# Patient Record
Sex: Female | Born: 1935
Health system: Southern US, Community
[De-identification: ages and names within clinical notes are randomized; demographics above are authoritative.]

## PROBLEM LIST (undated history)

## (undated) DIAGNOSIS — H269 Unspecified cataract: Secondary | ICD-10-CM

## (undated) DIAGNOSIS — J4489 Other specified chronic obstructive pulmonary disease: Secondary | ICD-10-CM

## (undated) DIAGNOSIS — E785 Hyperlipidemia, unspecified: Secondary | ICD-10-CM

## (undated) DIAGNOSIS — J189 Pneumonia, unspecified organism: Secondary | ICD-10-CM

## (undated) DIAGNOSIS — I10 Essential (primary) hypertension: Secondary | ICD-10-CM

## (undated) DIAGNOSIS — D689 Coagulation defect, unspecified: Secondary | ICD-10-CM

## (undated) DIAGNOSIS — F32A Depression, unspecified: Secondary | ICD-10-CM

## (undated) DIAGNOSIS — T7840XA Allergy, unspecified, initial encounter: Secondary | ICD-10-CM

## (undated) DIAGNOSIS — I272 Pulmonary hypertension, unspecified: Secondary | ICD-10-CM

## (undated) DIAGNOSIS — Z86718 Personal history of other venous thrombosis and embolism: Secondary | ICD-10-CM

## (undated) DIAGNOSIS — N824 Other female intestinal-genital tract fistulae: Secondary | ICD-10-CM

## (undated) DIAGNOSIS — K5730 Diverticulosis of large intestine without perforation or abscess without bleeding: Secondary | ICD-10-CM

## (undated) DIAGNOSIS — Z8719 Personal history of other diseases of the digestive system: Secondary | ICD-10-CM

## (undated) DIAGNOSIS — Z973 Presence of spectacles and contact lenses: Secondary | ICD-10-CM

## (undated) DIAGNOSIS — Z9289 Personal history of other medical treatment: Secondary | ICD-10-CM

## (undated) DIAGNOSIS — F329 Major depressive disorder, single episode, unspecified: Secondary | ICD-10-CM

## (undated) DIAGNOSIS — J449 Chronic obstructive pulmonary disease, unspecified: Secondary | ICD-10-CM

## (undated) DIAGNOSIS — Z860101 Personal history of adenomatous and serrated colon polyps: Secondary | ICD-10-CM

## (undated) DIAGNOSIS — K219 Gastro-esophageal reflux disease without esophagitis: Secondary | ICD-10-CM

## (undated) DIAGNOSIS — Z8673 Personal history of transient ischemic attack (TIA), and cerebral infarction without residual deficits: Secondary | ICD-10-CM

## (undated) DIAGNOSIS — I2510 Atherosclerotic heart disease of native coronary artery without angina pectoris: Secondary | ICD-10-CM

## (undated) DIAGNOSIS — M1990 Unspecified osteoarthritis, unspecified site: Secondary | ICD-10-CM

## (undated) DIAGNOSIS — I5032 Chronic diastolic (congestive) heart failure: Secondary | ICD-10-CM

## (undated) DIAGNOSIS — I499 Cardiac arrhythmia, unspecified: Secondary | ICD-10-CM

## (undated) DIAGNOSIS — M810 Age-related osteoporosis without current pathological fracture: Secondary | ICD-10-CM

## (undated) DIAGNOSIS — Z86010 Personal history of colonic polyps: Secondary | ICD-10-CM

## (undated) DIAGNOSIS — J45909 Unspecified asthma, uncomplicated: Secondary | ICD-10-CM

## (undated) DIAGNOSIS — Z972 Presence of dental prosthetic device (complete) (partial): Secondary | ICD-10-CM

## (undated) DIAGNOSIS — I639 Cerebral infarction, unspecified: Secondary | ICD-10-CM

## (undated) DIAGNOSIS — Z933 Colostomy status: Secondary | ICD-10-CM

## (undated) DIAGNOSIS — N289 Disorder of kidney and ureter, unspecified: Secondary | ICD-10-CM

## (undated) DIAGNOSIS — F419 Anxiety disorder, unspecified: Secondary | ICD-10-CM

## (undated) DIAGNOSIS — C189 Malignant neoplasm of colon, unspecified: Secondary | ICD-10-CM

## (undated) DIAGNOSIS — G629 Polyneuropathy, unspecified: Secondary | ICD-10-CM

## (undated) DIAGNOSIS — D649 Anemia, unspecified: Secondary | ICD-10-CM

## (undated) HISTORY — DX: Pulmonary hypertension, unspecified: I27.20

## (undated) HISTORY — DX: Cerebral infarction, unspecified: I63.9

## (undated) HISTORY — PX: ROTATOR CUFF REPAIR: SHX139

## (undated) HISTORY — DX: Chronic diastolic (congestive) heart failure: I50.32

## (undated) HISTORY — PX: ABDOMINAL HYSTERECTOMY: SHX81

## (undated) HISTORY — PX: APPENDECTOMY: SHX54

## (undated) HISTORY — PX: CORONARY ANGIOPLASTY WITH STENT PLACEMENT: SHX49

## (undated) HISTORY — DX: Atherosclerotic heart disease of native coronary artery without angina pectoris: I25.10

## (undated) HISTORY — PX: HAND SURGERY: SHX662

## (undated) HISTORY — PX: REVISION TOTAL KNEE ARTHROPLASTY: SUR1280

## (undated) HISTORY — DX: Age-related osteoporosis without current pathological fracture: M81.0

## (undated) HISTORY — PX: TOTAL KNEE ARTHROPLASTY: SHX125

## (undated) HISTORY — DX: Unspecified asthma, uncomplicated: J45.909

## (undated) HISTORY — PX: CATARACT EXTRACTION W/ INTRAOCULAR LENS  IMPLANT, BILATERAL: SHX1307

## (undated) HISTORY — DX: Unspecified cataract: H26.9

## (undated) HISTORY — PX: CHOLECYSTECTOMY: SHX55

## (undated) HISTORY — PX: OTHER SURGICAL HISTORY: SHX169

## (undated) HISTORY — DX: Allergy, unspecified, initial encounter: T78.40XA

## (undated) HISTORY — PX: TONSILLECTOMY: SUR1361

## (undated) HISTORY — DX: Essential (primary) hypertension: I10

## (undated) HISTORY — DX: Coagulation defect, unspecified: D68.9

## (undated) HISTORY — PX: LUMBAR LAMINECTOMY: SHX95

## (undated) HISTORY — DX: Anxiety disorder, unspecified: F41.9

## (undated) HISTORY — PX: DILATION AND CURETTAGE OF UTERUS: SHX78

## (undated) HISTORY — PX: CARPAL TUNNEL RELEASE: SHX101

---

## 1998-10-17 ENCOUNTER — Encounter: Payer: Self-pay | Admitting: Orthopedic Surgery

## 1998-10-25 ENCOUNTER — Inpatient Hospital Stay (HOSPITAL_COMMUNITY): Admission: RE | Admit: 1998-10-25 | Discharge: 1998-10-30 | Payer: Self-pay | Admitting: Orthopedic Surgery

## 1998-10-30 ENCOUNTER — Inpatient Hospital Stay (HOSPITAL_COMMUNITY)
Admission: RE | Admit: 1998-10-30 | Discharge: 1998-11-06 | Payer: Self-pay | Admitting: Physical Medicine & Rehabilitation

## 1998-11-08 ENCOUNTER — Encounter
Admission: RE | Admit: 1998-11-08 | Discharge: 1999-01-11 | Payer: Self-pay | Admitting: Physical Medicine & Rehabilitation

## 1999-08-22 ENCOUNTER — Encounter: Payer: Self-pay | Admitting: Internal Medicine

## 1999-08-22 ENCOUNTER — Encounter: Admission: RE | Admit: 1999-08-22 | Discharge: 1999-08-22 | Payer: Self-pay | Admitting: Internal Medicine

## 1999-10-05 ENCOUNTER — Inpatient Hospital Stay (HOSPITAL_COMMUNITY): Admission: RE | Admit: 1999-10-05 | Discharge: 1999-10-10 | Payer: Self-pay | Admitting: Orthopedic Surgery

## 1999-10-10 ENCOUNTER — Inpatient Hospital Stay (HOSPITAL_COMMUNITY)
Admission: RE | Admit: 1999-10-10 | Discharge: 1999-10-18 | Payer: Self-pay | Admitting: Physical Medicine & Rehabilitation

## 1999-10-22 ENCOUNTER — Encounter
Admission: RE | Admit: 1999-10-22 | Discharge: 1999-12-20 | Payer: Self-pay | Admitting: Physical Medicine & Rehabilitation

## 2000-09-26 ENCOUNTER — Encounter: Payer: Self-pay | Admitting: Internal Medicine

## 2000-09-26 ENCOUNTER — Encounter: Admission: RE | Admit: 2000-09-26 | Discharge: 2000-09-26 | Payer: Self-pay | Admitting: Internal Medicine

## 2000-11-12 ENCOUNTER — Encounter: Admission: RE | Admit: 2000-11-12 | Discharge: 2000-11-12 | Payer: Self-pay | Admitting: Orthopedic Surgery

## 2000-11-12 ENCOUNTER — Encounter: Payer: Self-pay | Admitting: Orthopedic Surgery

## 2000-11-26 ENCOUNTER — Encounter: Payer: Self-pay | Admitting: Orthopedic Surgery

## 2000-11-26 ENCOUNTER — Encounter: Admission: RE | Admit: 2000-11-26 | Discharge: 2000-11-26 | Payer: Self-pay | Admitting: Orthopedic Surgery

## 2000-12-10 ENCOUNTER — Encounter: Admission: RE | Admit: 2000-12-10 | Discharge: 2000-12-10 | Payer: Self-pay | Admitting: Orthopedic Surgery

## 2000-12-10 ENCOUNTER — Encounter: Payer: Self-pay | Admitting: Orthopedic Surgery

## 2002-02-10 ENCOUNTER — Encounter: Payer: Self-pay | Admitting: Internal Medicine

## 2002-02-10 ENCOUNTER — Encounter: Admission: RE | Admit: 2002-02-10 | Discharge: 2002-02-10 | Payer: Self-pay | Admitting: Internal Medicine

## 2002-04-19 ENCOUNTER — Encounter: Payer: Self-pay | Admitting: Orthopedic Surgery

## 2002-04-22 ENCOUNTER — Observation Stay (HOSPITAL_COMMUNITY): Admission: RE | Admit: 2002-04-22 | Discharge: 2002-04-25 | Payer: Self-pay | Admitting: Orthopedic Surgery

## 2002-10-04 ENCOUNTER — Encounter: Payer: Self-pay | Admitting: Specialist

## 2002-10-04 ENCOUNTER — Encounter: Admission: RE | Admit: 2002-10-04 | Discharge: 2002-10-04 | Payer: Self-pay | Admitting: Specialist

## 2002-10-29 ENCOUNTER — Encounter: Payer: Self-pay | Admitting: Specialist

## 2002-10-30 ENCOUNTER — Inpatient Hospital Stay (HOSPITAL_COMMUNITY): Admission: RE | Admit: 2002-10-30 | Discharge: 2002-11-03 | Payer: Self-pay | Admitting: Specialist

## 2003-04-08 ENCOUNTER — Encounter: Payer: Self-pay | Admitting: Specialist

## 2003-04-08 ENCOUNTER — Encounter: Admission: RE | Admit: 2003-04-08 | Discharge: 2003-04-08 | Payer: Self-pay | Admitting: Specialist

## 2003-04-18 ENCOUNTER — Ambulatory Visit (HOSPITAL_COMMUNITY): Admission: RE | Admit: 2003-04-18 | Discharge: 2003-04-18 | Payer: Self-pay | Admitting: Internal Medicine

## 2003-04-18 ENCOUNTER — Encounter: Payer: Self-pay | Admitting: Internal Medicine

## 2003-07-26 ENCOUNTER — Encounter: Admission: RE | Admit: 2003-07-26 | Discharge: 2003-07-26 | Payer: Self-pay | Admitting: Specialist

## 2003-11-02 ENCOUNTER — Encounter: Admission: RE | Admit: 2003-11-02 | Discharge: 2003-11-02 | Payer: Self-pay | Admitting: Specialist

## 2004-05-28 ENCOUNTER — Ambulatory Visit: Payer: Self-pay | Admitting: Internal Medicine

## 2004-09-04 ENCOUNTER — Ambulatory Visit: Payer: Self-pay | Admitting: Internal Medicine

## 2004-11-27 ENCOUNTER — Ambulatory Visit: Payer: Self-pay | Admitting: Internal Medicine

## 2005-02-12 ENCOUNTER — Ambulatory Visit: Payer: Self-pay | Admitting: Family Medicine

## 2005-02-27 ENCOUNTER — Ambulatory Visit: Payer: Self-pay | Admitting: Family Medicine

## 2005-03-05 ENCOUNTER — Ambulatory Visit: Payer: Self-pay | Admitting: Family Medicine

## 2005-05-18 ENCOUNTER — Ambulatory Visit: Payer: Self-pay | Admitting: Internal Medicine

## 2005-07-30 ENCOUNTER — Ambulatory Visit: Payer: Self-pay | Admitting: Internal Medicine

## 2005-08-27 ENCOUNTER — Ambulatory Visit: Payer: Self-pay | Admitting: Internal Medicine

## 2005-11-26 ENCOUNTER — Ambulatory Visit: Payer: Self-pay | Admitting: Internal Medicine

## 2006-02-26 ENCOUNTER — Ambulatory Visit: Payer: Self-pay | Admitting: Internal Medicine

## 2006-04-11 ENCOUNTER — Encounter: Admission: RE | Admit: 2006-04-11 | Discharge: 2006-04-11 | Payer: Self-pay | Admitting: Internal Medicine

## 2006-05-14 ENCOUNTER — Ambulatory Visit: Payer: Self-pay | Admitting: Internal Medicine

## 2006-06-17 ENCOUNTER — Encounter: Admission: RE | Admit: 2006-06-17 | Discharge: 2006-06-17 | Payer: Self-pay | Admitting: Family Medicine

## 2006-06-19 ENCOUNTER — Ambulatory Visit: Payer: Self-pay | Admitting: Internal Medicine

## 2006-06-23 ENCOUNTER — Ambulatory Visit: Payer: Self-pay | Admitting: Internal Medicine

## 2006-06-25 ENCOUNTER — Ambulatory Visit: Payer: Self-pay | Admitting: Internal Medicine

## 2006-10-22 ENCOUNTER — Ambulatory Visit: Payer: Self-pay | Admitting: Internal Medicine

## 2006-12-11 ENCOUNTER — Ambulatory Visit: Payer: Self-pay | Admitting: Internal Medicine

## 2007-01-21 ENCOUNTER — Ambulatory Visit: Payer: Self-pay | Admitting: Internal Medicine

## 2007-01-21 ENCOUNTER — Encounter: Payer: Self-pay | Admitting: Internal Medicine

## 2007-01-21 DIAGNOSIS — K449 Diaphragmatic hernia without obstruction or gangrene: Secondary | ICD-10-CM | POA: Insufficient documentation

## 2007-01-21 DIAGNOSIS — K5730 Diverticulosis of large intestine without perforation or abscess without bleeding: Secondary | ICD-10-CM | POA: Insufficient documentation

## 2007-01-21 DIAGNOSIS — Z860100 Personal history of colon polyps, unspecified: Secondary | ICD-10-CM | POA: Insufficient documentation

## 2007-01-21 DIAGNOSIS — I10 Essential (primary) hypertension: Secondary | ICD-10-CM | POA: Insufficient documentation

## 2007-01-21 DIAGNOSIS — K219 Gastro-esophageal reflux disease without esophagitis: Secondary | ICD-10-CM | POA: Insufficient documentation

## 2007-04-13 ENCOUNTER — Telehealth: Payer: Self-pay | Admitting: Internal Medicine

## 2007-05-19 ENCOUNTER — Ambulatory Visit: Payer: Self-pay | Admitting: Internal Medicine

## 2007-05-19 DIAGNOSIS — M1990 Unspecified osteoarthritis, unspecified site: Secondary | ICD-10-CM | POA: Insufficient documentation

## 2007-06-02 ENCOUNTER — Encounter: Admission: RE | Admit: 2007-06-02 | Discharge: 2007-06-02 | Payer: Self-pay | Admitting: Internal Medicine

## 2007-06-13 ENCOUNTER — Emergency Department (HOSPITAL_COMMUNITY): Admission: EM | Admit: 2007-06-13 | Discharge: 2007-06-13 | Payer: Self-pay | Admitting: Family Medicine

## 2007-06-15 ENCOUNTER — Ambulatory Visit: Payer: Self-pay | Admitting: Internal Medicine

## 2007-06-30 ENCOUNTER — Ambulatory Visit: Payer: Self-pay | Admitting: Internal Medicine

## 2007-06-30 DIAGNOSIS — R1031 Right lower quadrant pain: Secondary | ICD-10-CM | POA: Insufficient documentation

## 2007-06-30 LAB — CONVERTED CEMR LAB
Basophils Absolute: 0 10*3/uL (ref 0.0–0.1)
Basophils Relative: 0.7 % (ref 0.0–1.0)
Eosinophils Absolute: 0 10*3/uL (ref 0.0–0.6)
Eosinophils Relative: 0.5 % (ref 0.0–5.0)
Ferritin: 10.5 ng/mL (ref 10.0–291.0)
HCT: 28.3 % — ABNORMAL LOW (ref 36.0–46.0)
Hemoglobin: 9.4 g/dL — ABNORMAL LOW (ref 12.0–15.0)
Lymphocytes Relative: 23.1 % (ref 12.0–46.0)
MCHC: 33.2 g/dL (ref 30.0–36.0)
MCV: 74 fL — ABNORMAL LOW (ref 78.0–100.0)
Monocytes Absolute: 0.4 10*3/uL (ref 0.2–0.7)
Monocytes Relative: 6.9 % (ref 3.0–11.0)
Neutro Abs: 4.5 10*3/uL (ref 1.4–7.7)
Neutrophils Relative %: 68.8 % (ref 43.0–77.0)
Platelets: 222 10*3/uL (ref 150–400)
RBC: 3.82 M/uL — ABNORMAL LOW (ref 3.87–5.11)
RDW: 15.2 % — ABNORMAL HIGH (ref 11.5–14.6)
WBC: 6.4 10*3/uL (ref 4.5–10.5)

## 2007-07-07 ENCOUNTER — Telehealth: Payer: Self-pay | Admitting: Internal Medicine

## 2007-07-14 ENCOUNTER — Telehealth: Payer: Self-pay | Admitting: Family Medicine

## 2007-07-31 ENCOUNTER — Ambulatory Visit: Payer: Self-pay | Admitting: Internal Medicine

## 2007-07-31 DIAGNOSIS — R519 Headache, unspecified: Secondary | ICD-10-CM | POA: Insufficient documentation

## 2007-07-31 DIAGNOSIS — R51 Headache: Secondary | ICD-10-CM | POA: Insufficient documentation

## 2007-07-31 LAB — CONVERTED CEMR LAB
Basophils Absolute: 0.1 10*3/uL (ref 0.0–0.1)
Basophils Relative: 0.9 % (ref 0.0–1.0)
Eosinophils Absolute: 0 10*3/uL (ref 0.0–0.6)
Eosinophils Relative: 0.6 % (ref 0.0–5.0)
HCT: 28.5 % — ABNORMAL LOW (ref 36.0–46.0)
Hemoglobin: 9.5 g/dL — ABNORMAL LOW (ref 12.0–15.0)
Lymphocytes Relative: 23.7 % (ref 12.0–46.0)
MCHC: 33.3 g/dL (ref 30.0–36.0)
MCV: 74.8 fL — ABNORMAL LOW (ref 78.0–100.0)
Monocytes Absolute: 0.5 10*3/uL (ref 0.2–0.7)
Monocytes Relative: 7.6 % (ref 3.0–11.0)
Neutro Abs: 4.2 10*3/uL (ref 1.4–7.7)
Neutrophils Relative %: 67.2 % (ref 43.0–77.0)
Platelets: 198 10*3/uL (ref 150–400)
RBC: 3.81 M/uL — ABNORMAL LOW (ref 3.87–5.11)
RDW: 15.9 % — ABNORMAL HIGH (ref 11.5–14.6)
WBC: 6.3 10*3/uL (ref 4.5–10.5)

## 2007-09-01 ENCOUNTER — Telehealth: Payer: Self-pay | Admitting: Internal Medicine

## 2007-09-09 ENCOUNTER — Ambulatory Visit: Payer: Self-pay | Admitting: Internal Medicine

## 2007-09-14 ENCOUNTER — Telehealth: Payer: Self-pay | Admitting: Internal Medicine

## 2007-10-05 ENCOUNTER — Ambulatory Visit: Payer: Self-pay | Admitting: Internal Medicine

## 2007-10-05 DIAGNOSIS — J069 Acute upper respiratory infection, unspecified: Secondary | ICD-10-CM | POA: Insufficient documentation

## 2007-10-08 ENCOUNTER — Telehealth: Payer: Self-pay | Admitting: Internal Medicine

## 2007-10-08 LAB — CONVERTED CEMR LAB
Basophils Absolute: 0 10*3/uL (ref 0.0–0.1)
Basophils Relative: 0.8 % (ref 0.0–1.0)
Eosinophils Absolute: 0.1 10*3/uL (ref 0.0–0.6)
Eosinophils Relative: 1.2 % (ref 0.0–5.0)
HCT: 30.4 % — ABNORMAL LOW (ref 36.0–46.0)
Hemoglobin: 9.3 g/dL — ABNORMAL LOW (ref 12.0–15.0)
Lymphocytes Relative: 26.5 % (ref 12.0–46.0)
MCHC: 30.5 g/dL (ref 30.0–36.0)
MCV: 75.8 fL — ABNORMAL LOW (ref 78.0–100.0)
Monocytes Absolute: 0.5 10*3/uL (ref 0.2–0.7)
Monocytes Relative: 10.1 % (ref 3.0–11.0)
Neutro Abs: 3.2 10*3/uL (ref 1.4–7.7)
Neutrophils Relative %: 61.4 % (ref 43.0–77.0)
Platelets: 210 10*3/uL (ref 150–400)
RBC: 4.01 M/uL (ref 3.87–5.11)
RDW: 17.4 % — ABNORMAL HIGH (ref 11.5–14.6)
WBC: 5.2 10*3/uL (ref 4.5–10.5)

## 2007-10-12 ENCOUNTER — Telehealth: Payer: Self-pay | Admitting: Internal Medicine

## 2007-10-12 ENCOUNTER — Encounter: Payer: Self-pay | Admitting: Internal Medicine

## 2007-10-30 ENCOUNTER — Ambulatory Visit: Payer: Self-pay | Admitting: Internal Medicine

## 2007-10-30 LAB — CONVERTED CEMR LAB
BUN: 26 mg/dL — ABNORMAL HIGH (ref 6–23)
Basophils Absolute: 0.1 10*3/uL (ref 0.0–0.1)
Basophils Relative: 0.7 % (ref 0.0–1.0)
CO2: 27 meq/L (ref 19–32)
Calcium: 9.1 mg/dL (ref 8.4–10.5)
Chloride: 104 meq/L (ref 96–112)
Creatinine, Ser: 1.3 mg/dL — ABNORMAL HIGH (ref 0.4–1.2)
Eosinophils Absolute: 0 10*3/uL (ref 0.0–0.7)
Eosinophils Relative: 0.1 % (ref 0.0–5.0)
GFR calc Af Amer: 51 mL/min
GFR calc non Af Amer: 42 mL/min
Glucose, Bld: 112 mg/dL — ABNORMAL HIGH (ref 70–99)
HCT: 29.8 % — ABNORMAL LOW (ref 36.0–46.0)
Hemoglobin: 9.6 g/dL — ABNORMAL LOW (ref 12.0–15.0)
INR: 1.2 — ABNORMAL HIGH (ref 0.8–1.0)
Lymphocytes Relative: 6.7 % — ABNORMAL LOW (ref 12.0–46.0)
MCHC: 32.3 g/dL (ref 30.0–36.0)
MCV: 76.7 fL — ABNORMAL LOW (ref 78.0–100.0)
Monocytes Absolute: 1.2 10*3/uL — ABNORMAL HIGH (ref 0.1–1.0)
Monocytes Relative: 6.3 % (ref 3.0–12.0)
Neutro Abs: 16.4 10*3/uL — ABNORMAL HIGH (ref 1.4–7.7)
Neutrophils Relative %: 86.2 % — ABNORMAL HIGH (ref 43.0–77.0)
Platelets: 219 10*3/uL (ref 150–400)
Potassium: 4.9 meq/L (ref 3.5–5.1)
Prothrombin Time: 14.3 s — ABNORMAL HIGH (ref 10.9–13.3)
RBC: 3.88 M/uL (ref 3.87–5.11)
RDW: 18.1 % — ABNORMAL HIGH (ref 11.5–14.6)
Sodium: 139 meq/L (ref 135–145)
WBC: 19 10*3/uL (ref 4.5–10.5)
aPTT: 31.1 s — ABNORMAL HIGH (ref 21.7–29.8)

## 2007-11-02 ENCOUNTER — Ambulatory Visit: Payer: Self-pay | Admitting: Internal Medicine

## 2007-11-02 ENCOUNTER — Inpatient Hospital Stay (HOSPITAL_BASED_OUTPATIENT_CLINIC_OR_DEPARTMENT_OTHER): Admission: RE | Admit: 2007-11-02 | Discharge: 2007-11-02 | Payer: Self-pay | Admitting: Internal Medicine

## 2007-11-04 ENCOUNTER — Ambulatory Visit: Payer: Self-pay | Admitting: Cardiovascular Disease

## 2007-11-04 ENCOUNTER — Inpatient Hospital Stay (HOSPITAL_COMMUNITY): Admission: RE | Admit: 2007-11-04 | Discharge: 2007-11-06 | Payer: Self-pay | Admitting: Cardiology

## 2007-11-12 ENCOUNTER — Telehealth (INDEPENDENT_AMBULATORY_CARE_PROVIDER_SITE_OTHER): Payer: Self-pay | Admitting: *Deleted

## 2007-11-17 ENCOUNTER — Ambulatory Visit: Payer: Self-pay | Admitting: Internal Medicine

## 2007-11-17 LAB — CONVERTED CEMR LAB
Sed Rate: 48 mm/hr — ABNORMAL HIGH (ref 0–22)
Total CK: 56 units/L (ref 7–177)

## 2007-12-03 ENCOUNTER — Ambulatory Visit: Payer: Self-pay | Admitting: Internal Medicine

## 2007-12-04 ENCOUNTER — Encounter (HOSPITAL_COMMUNITY): Admission: RE | Admit: 2007-12-04 | Discharge: 2008-01-13 | Payer: Self-pay | Admitting: Internal Medicine

## 2008-01-01 ENCOUNTER — Ambulatory Visit: Payer: Self-pay | Admitting: Internal Medicine

## 2008-01-09 ENCOUNTER — Encounter: Payer: Self-pay | Admitting: Internal Medicine

## 2008-01-20 ENCOUNTER — Encounter: Admission: RE | Admit: 2008-01-20 | Discharge: 2008-01-20 | Payer: Self-pay | Admitting: Specialist

## 2008-02-17 ENCOUNTER — Encounter: Payer: Self-pay | Admitting: Internal Medicine

## 2008-04-05 ENCOUNTER — Ambulatory Visit: Payer: Self-pay | Admitting: Internal Medicine

## 2008-04-05 DIAGNOSIS — I2510 Atherosclerotic heart disease of native coronary artery without angina pectoris: Secondary | ICD-10-CM | POA: Insufficient documentation

## 2008-04-05 DIAGNOSIS — E7800 Pure hypercholesterolemia, unspecified: Secondary | ICD-10-CM | POA: Insufficient documentation

## 2008-04-05 LAB — CONVERTED CEMR LAB
AST: 24 units/L (ref 0–37)
Cholesterol: 117 mg/dL (ref 0–200)

## 2008-06-15 ENCOUNTER — Encounter: Admission: RE | Admit: 2008-06-15 | Discharge: 2008-06-15 | Payer: Self-pay | Admitting: Internal Medicine

## 2008-06-15 ENCOUNTER — Ambulatory Visit: Payer: Self-pay | Admitting: Internal Medicine

## 2008-06-20 ENCOUNTER — Ambulatory Visit: Payer: Self-pay | Admitting: Internal Medicine

## 2008-06-20 ENCOUNTER — Encounter: Payer: Self-pay | Admitting: Internal Medicine

## 2008-06-20 LAB — CONVERTED CEMR LAB
ALT: 17 units/L (ref 0–35)
AST: 27 units/L (ref 0–37)
Albumin: 3.8 g/dL (ref 3.5–5.2)
Alkaline Phosphatase: 61 units/L (ref 39–117)
BUN: 30 mg/dL — ABNORMAL HIGH (ref 6–23)
Bilirubin, Direct: 0.1 mg/dL (ref 0.0–0.3)
CO2: 27 meq/L (ref 19–32)
Calcium: 9.5 mg/dL (ref 8.4–10.5)
Chloride: 104 meq/L (ref 96–112)
Cholesterol: 115 mg/dL (ref 0–200)
Creatinine, Ser: 1.4 mg/dL — ABNORMAL HIGH (ref 0.4–1.2)
GFR calc Af Amer: 47 mL/min
GFR calc non Af Amer: 39 mL/min
Glucose, Bld: 106 mg/dL — ABNORMAL HIGH (ref 70–99)
HDL: 32.3 mg/dL — ABNORMAL LOW (ref >39.0)
LDL Cholesterol: 59 mg/dL (ref 0–99)
Potassium: 4.6 meq/L (ref 3.5–5.1)
Sodium: 140 meq/L (ref 135–145)
Total Bilirubin: 0.5 mg/dL (ref 0.3–1.2)
Total CHOL/HDL Ratio: 3.6
Total Protein: 7 g/dL (ref 6.0–8.3)
Triglycerides: 118 mg/dL (ref 0–149)
VLDL: 24 mg/dL (ref 0–40)

## 2008-09-05 ENCOUNTER — Encounter: Admission: RE | Admit: 2008-09-05 | Discharge: 2008-09-05 | Payer: Self-pay | Admitting: Specialist

## 2008-09-14 ENCOUNTER — Encounter: Payer: Self-pay | Admitting: Internal Medicine

## 2008-09-26 ENCOUNTER — Encounter: Admission: RE | Admit: 2008-09-26 | Discharge: 2008-10-10 | Payer: Self-pay | Admitting: Specialist

## 2008-09-27 ENCOUNTER — Ambulatory Visit: Payer: Self-pay | Admitting: Internal Medicine

## 2008-09-27 LAB — CONVERTED CEMR LAB
BUN: 30 mg/dL — ABNORMAL HIGH (ref 6–23)
Basophils Absolute: 0 10*3/uL (ref 0.0–0.1)
Basophils Relative: 0.3 % (ref 0.0–3.0)
CO2: 28 meq/L (ref 19–32)
Calcium: 9.3 mg/dL (ref 8.4–10.5)
Chloride: 108 meq/L (ref 96–112)
Creatinine, Ser: 1.3 mg/dL — ABNORMAL HIGH (ref 0.4–1.2)
Eosinophils Absolute: 0.1 10*3/uL (ref 0.0–0.7)
Eosinophils Relative: 1 % (ref 0.0–5.0)
GFR calc non Af Amer: 42.27 mL/min (ref >60)
Glucose, Bld: 95 mg/dL (ref 70–99)
HCT: 37.6 % (ref 36.0–46.0)
Hemoglobin: 12.8 g/dL (ref 12.0–15.0)
Lymphocytes Relative: 19.3 % (ref 12.0–46.0)
Lymphs Abs: 1.2 10*3/uL (ref 0.7–4.0)
MCHC: 34 g/dL (ref 30.0–36.0)
MCV: 91.6 fL (ref 78.0–100.0)
Monocytes Absolute: 0.4 10*3/uL (ref 0.1–1.0)
Monocytes Relative: 7 % (ref 3.0–12.0)
Neutro Abs: 4.7 10*3/uL (ref 1.4–7.7)
Neutrophils Relative %: 72.4 % (ref 43.0–77.0)
Platelets: 125 10*3/uL — ABNORMAL LOW (ref 150.0–400.0)
Potassium: 4.4 meq/L (ref 3.5–5.1)
RBC: 4.1 M/uL (ref 3.87–5.11)
RDW: 11.7 % (ref 11.5–14.6)
Sodium: 143 meq/L (ref 135–145)
WBC: 6.4 10*3/uL (ref 4.5–10.5)

## 2008-11-28 ENCOUNTER — Telehealth: Payer: Self-pay | Admitting: Internal Medicine

## 2008-12-22 ENCOUNTER — Ambulatory Visit: Payer: Self-pay | Admitting: Internal Medicine

## 2008-12-27 ENCOUNTER — Encounter: Payer: Self-pay | Admitting: Internal Medicine

## 2009-01-31 ENCOUNTER — Inpatient Hospital Stay (HOSPITAL_COMMUNITY): Admission: RE | Admit: 2009-01-31 | Discharge: 2009-02-02 | Payer: Self-pay | Admitting: Specialist

## 2009-01-31 HISTORY — PX: ANTERIOR CERVICAL DECOMP/DISCECTOMY FUSION: SHX1161

## 2009-02-27 ENCOUNTER — Ambulatory Visit: Payer: Self-pay | Admitting: Internal Medicine

## 2009-02-27 DIAGNOSIS — I5032 Chronic diastolic (congestive) heart failure: Secondary | ICD-10-CM | POA: Insufficient documentation

## 2009-02-27 DIAGNOSIS — I5033 Acute on chronic diastolic (congestive) heart failure: Secondary | ICD-10-CM | POA: Insufficient documentation

## 2009-03-29 ENCOUNTER — Ambulatory Visit: Payer: Self-pay | Admitting: Internal Medicine

## 2009-06-30 ENCOUNTER — Encounter: Admission: RE | Admit: 2009-06-30 | Discharge: 2009-06-30 | Payer: Self-pay | Admitting: Internal Medicine

## 2009-08-09 ENCOUNTER — Ambulatory Visit: Payer: Self-pay | Admitting: Internal Medicine

## 2009-08-09 DIAGNOSIS — N390 Urinary tract infection, site not specified: Secondary | ICD-10-CM | POA: Insufficient documentation

## 2009-08-09 LAB — CONVERTED CEMR LAB
Bilirubin Urine: NEGATIVE
Blood in Urine, dipstick: NEGATIVE
Glucose, Urine, Semiquant: NEGATIVE
Ketones, urine, test strip: NEGATIVE
Nitrite: POSITIVE
Specific Gravity, Urine: >=1.03
Urobilinogen, UA: 0.2
pH: 5

## 2009-08-16 ENCOUNTER — Telehealth: Payer: Self-pay | Admitting: Internal Medicine

## 2009-09-16 ENCOUNTER — Encounter: Payer: Self-pay | Admitting: Internal Medicine

## 2009-11-08 ENCOUNTER — Ambulatory Visit: Payer: Self-pay | Admitting: Internal Medicine

## 2009-11-08 DIAGNOSIS — R300 Dysuria: Secondary | ICD-10-CM | POA: Insufficient documentation

## 2009-11-08 DIAGNOSIS — L089 Local infection of the skin and subcutaneous tissue, unspecified: Secondary | ICD-10-CM | POA: Insufficient documentation

## 2009-11-08 LAB — CONVERTED CEMR LAB
Bilirubin Urine: NEGATIVE
Glucose, Urine, Semiquant: NEGATIVE
Ketones, urine, test strip: NEGATIVE
Nitrite: NEGATIVE
Specific Gravity, Urine: 1.02
Urobilinogen, UA: 0.2
pH: 5

## 2009-11-13 ENCOUNTER — Ambulatory Visit: Payer: Self-pay | Admitting: Internal Medicine

## 2009-11-13 DIAGNOSIS — R609 Edema, unspecified: Secondary | ICD-10-CM | POA: Insufficient documentation

## 2009-11-13 DIAGNOSIS — M79609 Pain in unspecified limb: Secondary | ICD-10-CM | POA: Insufficient documentation

## 2009-11-13 DIAGNOSIS — R0602 Shortness of breath: Secondary | ICD-10-CM | POA: Insufficient documentation

## 2009-12-01 ENCOUNTER — Ambulatory Visit (HOSPITAL_COMMUNITY): Admission: RE | Admit: 2009-12-01 | Discharge: 2009-12-01 | Payer: Self-pay | Admitting: Internal Medicine

## 2009-12-01 ENCOUNTER — Ambulatory Visit: Payer: Self-pay | Admitting: Internal Medicine

## 2009-12-01 ENCOUNTER — Encounter (INDEPENDENT_AMBULATORY_CARE_PROVIDER_SITE_OTHER): Payer: Self-pay | Admitting: *Deleted

## 2009-12-01 ENCOUNTER — Encounter: Payer: Self-pay | Admitting: Internal Medicine

## 2009-12-01 ENCOUNTER — Ambulatory Visit: Payer: Self-pay

## 2009-12-01 ENCOUNTER — Ambulatory Visit: Payer: Self-pay | Admitting: Cardiology

## 2009-12-01 HISTORY — PX: TRANSTHORACIC ECHOCARDIOGRAM: SHX275

## 2009-12-15 ENCOUNTER — Telehealth: Payer: Self-pay | Admitting: Internal Medicine

## 2010-02-07 ENCOUNTER — Ambulatory Visit: Payer: Self-pay | Admitting: Internal Medicine

## 2010-02-07 DIAGNOSIS — F32A Depression, unspecified: Secondary | ICD-10-CM | POA: Insufficient documentation

## 2010-02-07 DIAGNOSIS — F329 Major depressive disorder, single episode, unspecified: Secondary | ICD-10-CM

## 2010-02-12 ENCOUNTER — Telehealth: Payer: Self-pay | Admitting: Internal Medicine

## 2010-02-13 ENCOUNTER — Ambulatory Visit: Payer: Self-pay | Admitting: Internal Medicine

## 2010-02-13 DIAGNOSIS — M542 Cervicalgia: Secondary | ICD-10-CM | POA: Insufficient documentation

## 2010-02-17 ENCOUNTER — Encounter: Admission: RE | Admit: 2010-02-17 | Discharge: 2010-02-17 | Payer: Self-pay | Admitting: Internal Medicine

## 2010-03-02 ENCOUNTER — Telehealth: Payer: Self-pay | Admitting: Internal Medicine

## 2010-03-14 ENCOUNTER — Ambulatory Visit: Payer: Self-pay | Admitting: Internal Medicine

## 2010-03-14 ENCOUNTER — Telehealth: Payer: Self-pay | Admitting: Internal Medicine

## 2010-03-14 DIAGNOSIS — R059 Cough, unspecified: Secondary | ICD-10-CM | POA: Insufficient documentation

## 2010-03-14 DIAGNOSIS — R05 Cough: Secondary | ICD-10-CM

## 2010-03-23 ENCOUNTER — Ambulatory Visit: Payer: Self-pay | Admitting: Internal Medicine

## 2010-04-26 ENCOUNTER — Ambulatory Visit: Payer: Self-pay | Admitting: Internal Medicine

## 2010-07-25 ENCOUNTER — Ambulatory Visit
Admission: RE | Admit: 2010-07-25 | Discharge: 2010-07-25 | Payer: Self-pay | Source: Home / Self Care | Attending: Internal Medicine | Admitting: Internal Medicine

## 2010-08-12 LAB — CONVERTED CEMR LAB
BUN: 18 mg/dL (ref 6–23)
Basophils Absolute: 0 10*3/uL (ref 0.0–0.1)
Basophils Relative: 0.4 % (ref 0.0–3.0)
CO2: 27 meq/L (ref 19–32)
Calcium: 9.1 mg/dL (ref 8.4–10.5)
Chloride: 108 meq/L (ref 96–112)
Creatinine, Ser: 1.2 mg/dL (ref 0.4–1.2)
Eosinophils Absolute: 0.1 10*3/uL (ref 0.0–0.7)
Eosinophils Relative: 0.9 % (ref 0.0–5.0)
GFR calc non Af Amer: 46.22 mL/min (ref >60)
Glucose, Bld: 92 mg/dL (ref 70–99)
HCT: 35.9 % — ABNORMAL LOW (ref 36.0–46.0)
Hemoglobin: 12.3 g/dL (ref 12.0–15.0)
Lymphocytes Relative: 23.1 % (ref 12.0–46.0)
Lymphs Abs: 1.4 10*3/uL (ref 0.7–4.0)
MCHC: 34.2 g/dL (ref 30.0–36.0)
MCV: 87.5 fL (ref 78.0–100.0)
Monocytes Absolute: 0.4 10*3/uL (ref 0.1–1.0)
Monocytes Relative: 7.4 % (ref 3.0–12.0)
Neutro Abs: 4.1 10*3/uL (ref 1.4–7.7)
Neutrophils Relative %: 68.2 % (ref 43.0–77.0)
Platelets: 154 10*3/uL (ref 150.0–400.0)
Potassium: 4.5 meq/L (ref 3.5–5.1)
Pro B Natriuretic peptide (BNP): 213 pg/mL — ABNORMAL HIGH (ref 0.0–100.0)
RBC: 4.1 M/uL (ref 3.87–5.11)
RDW: 13.9 % (ref 11.5–14.6)
Sodium: 143 meq/L (ref 135–145)
WBC: 6.1 10*3/uL (ref 4.5–10.5)

## 2010-08-16 NOTE — Progress Notes (Signed)
Summary: med ?  Phone Note Call from Patient   Caller: Patient Call For: Gordy Savers  MD Summary of Call: Needs appt.  Called back but line busy.  [phone] Initial call taken by: Lynann Beaver CMA,  February 12, 2010 1:13 PM  Follow-up for Phone Call        Appt scheduled. Follow-up by: Lynann Beaver CMA,  February 13, 2010 8:31 AM

## 2010-08-16 NOTE — Progress Notes (Signed)
Summary: same discharge/burning  Medications Added BENAZEPRIL HCL 40 MG TABS (BENAZEPRIL HCL)  CELEBREX 200 MG CAPS (CELECOXIB)  LORAZEPAM 0.5 MG TABS (LORAZEPAM)  FLAGYL 500 MG  TABS (METRONIDAZOLE) one three times a day with meals PREMARIN 0.625 MG/GM  CREA (ESTROGENS, CONJUGATED) use vaginally three times daily for 2 weeks, then two times weekly       Phone Note Call from Patient Call back at Home Phone [phone]   Caller: Patient Call For: kWIAT Summary of Call: Pt called to report she has "the same problem" as in [place] and July.  She has been put on antibiotics in [place] and July for vaginal discharge with burning sensation with voiding and her sx "never really go away", "just feel a little better for a few days when on antibiotic".  Does Dr Winfred Burn want to see me again for this same problem of is it time to do something else? [facility] on [address] Initial call taken by: Sid Falcon LPN,  April 13, 2007 9:23 AM  Follow-up for Phone Call        Generic flagyl 500  #30 one three times a day with meals  Premarin vaginal cream  Use every other day for two weeks , then twice weekly Follow-up by: Gordy Savers  MD,  April 13, 2007 9:49 AM  Additional Follow-up for Phone Call Additional follow up Details #1::        Rx sent electronically, pt informed. Additional Follow-up by: Sid Falcon LPN,  April 13, 2007 10:17 AM    New/Updated Medications: BENAZEPRIL HCL 40 MG TABS (BENAZEPRIL HCL)  CELEBREX 200 MG CAPS (CELECOXIB)  LORAZEPAM 0.5 MG TABS (LORAZEPAM)  FLAGYL 500 MG  TABS (METRONIDAZOLE) one three times a day with meals PREMARIN 0.625 MG/GM  CREA (ESTROGENS, CONJUGATED) use vaginally three times daily for 2 weeks, then two times weekly   Prescriptions: PREMARIN 0.625 MG/GM  CREA (ESTROGENS, CONJUGATED) use vaginally three times daily for 2 weeks, then two times weekly  #1 x 0   Entered by:   Sid Falcon LPN   Authorized by:   Gordy Savers   MD   Signed by:   Sid Falcon LPN on 09/81/1914   Method used:   Electronically sent to ...       [facility] [address]*       [address]       [place], [place]  [address]       Ph: [phone]       Fax: [phone]   RxID:   [id number] FLAGYL 500 MG  TABS (METRONIDAZOLE) one three times a day with meals  #30 x 0   Entered by:   Sid Falcon LPN   Authorized by:   Gordy Savers  MD   Signed by:   Sid Falcon LPN on 40/34/7425   Method used:   Electronically sent to ...       [facility] [address]       [address]       [place], [place]  [address]       Ph: [phone]       Fax: [phone]   RxID:   [id number]     Appended Document: same discharge/burning Pharmacy called, premarin cream error in directions, use 3 times a week, not three times a day.  Correction made.

## 2010-08-16 NOTE — Medication Information (Signed)
Summary: Coverage Approval for Celebrex  Coverage Approval for Celebrex   Imported By: Maryln Gottron 09/19/2009 15:11:51  _____________________________________________________________________  External Attachment:    Type:   Image     Comment:   External Document

## 2010-08-16 NOTE — Assessment & Plan Note (Signed)
Summary: 4 MONTH F/UP//DB   Vital Signs:  Patient Profile:   77 Years Old Female Weight:      203 pounds Temp:     98.1 degrees F oral Pulse rate:   72 / minute Pulse rhythm:   regular BP sitting:   168 / 74  (left arm) Cuff size:   regular  Vitals Entered By: Raechel Ache, RN (October 05, 2007 10:39 AM)                 Chief Complaint:  ROV. Has had cough all winter; change in BP med didn't help.Marland Kitchen  History of Present Illness: 77 year old patient seen today for follow-up.  She is in one month ago for a URI and is somewhat better but still having some mild chest congestion, cough, and postnasal drip.  She did receive a Z-Pak by an  on-call physician about 3 weeks ago. She is also requesting assistance in obtaining coverage for Celebrex, which has been very helpful for her arthritis.  Other anti-inflammatory drugs have been ineffective. she has hypertension and significant DJD.  She is scheduled for some epidurals by Dr. Otelia Sergeant for refractory pain.    Current Allergies: No known allergies   Past Medical History:    Reviewed history from 01/21/2007 and no changes required:       Hypertension       Advanced DJD       Colonic polyps, hx of       Diverticulosis, colon       GERD w/esophageal strictures       Hiatal hernia  Past Surgical History:    Reviewed history from 01/21/2007 and no changes required:       Appendectomy       Cholecystectomy       Hysterectomy       Lumbar laminectomy       Total knee replacement x2       Rotator cuff repair   Family History:    Reviewed history from 05/19/2007 and no changes required:       father diedt age 54 and her heart condition       mother died age 51, heart failure, and significant osteoarthritis              Four brothers three half brothers Posicor R. disease with renal failure       three half sisters    Review of Systems       The patient complains of prolonged cough.     Physical Exam  General:  overweight-appearing.   Head:     Normocephalic and atraumatic without obvious abnormalities. No apparent alopecia or balding. Eyes:     No corneal or conjunctival inflammation noted. EOMI. Perrla. Funduscopic exam benign, without hemorrhages, exudates or papilledema. Vision grossly normal. Mouth:     Oral mucosa and oropharynx without lesions or exudates.  Teeth in good repair. Neck:     No deformities, masses, or tenderness noted. Chest Wall:     No deformities, masses, or tenderness noted. Lungs:     few scattered rhonchi noted Heart:     Normal rate and regular rhythm. S1 and S2 normal without gallop, murmur, click, rub or other extra sounds. Abdomen:     Bowel sounds positive,abdomen soft and non-tender without masses, organomegaly or hernias noted.    Impression & Recommendations:  Problem # 1:  DEGENERATIVE JOINT DISEASE (ICD-715.90)  Her updated medication list for this problem includes:  Celebrex 200 Mg Caps (Celecoxib) ..... One by mouth daily    Hydrocodone-acetaminophen 5-500 Mg Tabs (Hydrocodone-acetaminophen) ..... One every 6 hs for pain    Propoxyphene N-apap 100-650 Mg Tabs (Propoxyphene n-apap) ..... One every 6 hours for pain   Problem # 2:  HYPERTENSION (ICD-401.9)  Her updated medication list for this problem includes:    Benazepril Hcl 40 Mg Tabs (Benazepril hcl) ..... One by mouth daily    Hydrochlorothiazide 25 Mg Tabs (Hydrochlorothiazide) ..... One by mouth daily   Problem # 3:  URI (ICD-465.9)  Her updated medication list for this problem includes:    Celebrex 200 Mg Caps (Celecoxib) ..... One by mouth daily   Complete Medication List: 1)  Benazepril Hcl 40 Mg Tabs (Benazepril hcl) .... One by mouth daily 2)  Celebrex 200 Mg Caps (Celecoxib) .... One by mouth daily 3)  Lorazepam 0.5 Mg Tabs (Lorazepam) 4)  Premarin 0.625 Mg/gm Crea (Estrogens, conjugated) .... Use vaginally three times daily for 2 weeks, then two times weekly 5)   Hydrochlorothiazide 25 Mg Tabs (Hydrochlorothiazide) .... One by mouth daily 6)  Prilosec 20 Mg Cpdr (Omeprazole) .... One by mouth daily 7)  Klor-con M20 20 Meq Tbcr (Potassium chloride crys cr) .... One by mouth daily 8)  Neurontin 600 Mg Tabs (Gabapentin) .... One tid 9)  Metoclopramide Hcl 10 Mg Tabs (Metoclopramide hcl) .... One daily before evening meal 10)  Hydrocodone-acetaminophen 5-500 Mg Tabs (Hydrocodone-acetaminophen) .... One every 6 hs for pain 11)  Trazodone Hcl 100 Mg Tabs (Trazodone hcl) .... 1/2 at bedtime 12)  Nu-iron 150 Mg Caps (Polysaccharide iron complex) .Marland Kitchen.. 1 once daily 13)  Propoxyphene N-apap 100-650 Mg Tabs (Propoxyphene n-apap) .... One every 6 hours for pain 14)  Zithromax Z-pak 250 Mg Tabs (Azithromycin) .... Take as directed  Other Orders: Venipuncture (13244) TLB-CBC Platelet - w/Differential (85025-CBCD)   Patient Instructions: 1)  Please schedule a follow-up appointment in 3 months. 2)  Limit your Sodium (Salt) to less than 2 grams a day(slightly less than 1/2 a teaspoon) to prevent fluid retention, swelling, or worsening of symptoms. 3)  It is important that you exercise regularly at least 20 minutes 5 times a week. If you develop chest pain, have severe difficulty breathing, or feel very tired , stop exercising immediately and seek medical attention. 4)  You need to lose weight. Consider a lower calorie diet and regular exercise.     Prescriptions: TRAZODONE HCL 100 MG  TABS (TRAZODONE HCL) 1/2 at bedtime  #90 x 6   Entered and Authorized by:   Gordy Savers  MD   Signed by:   Gordy Savers  MD on 10/05/2007   Method used:   Print then Give to Patient   RxID:   [id number] HYDROCODONE-ACETAMINOPHEN 5-500 MG  TABS (HYDROCODONE-ACETAMINOPHEN) one every 6 hs for pain  #90 x 6   Entered and Authorized by:   Gordy Savers  MD   Signed by:   Gordy Savers  MD on 10/05/2007   Method used:   Print then Give to Patient    RxID:   [id number] METOCLOPRAMIDE HCL 10 MG  TABS (METOCLOPRAMIDE HCL) one daily before evening meaL  #90 x 6   Entered and Authorized by:   Gordy Savers  MD   Signed by:   Gordy Savers  MD on 10/05/2007   Method used:   Print then Give to Patient   RxID:   [id number] NEURONTIN 600 MG  TABS (GABAPENTIN) one tid  #270 x 6   Entered and Authorized by:   Gordy Savers  MD   Signed by:   Gordy Savers  MD on 10/05/2007   Method used:   Print then Give to Patient   RxID:   [id number] KLOR-CON M20 20 MEQ  TBCR (POTASSIUM CHLORIDE CRYS CR) one by mouth daily  #90 x 6   Entered and Authorized by:   Gordy Savers  MD   Signed by:   Gordy Savers  MD on 10/05/2007   Method used:   Print then Give to Patient   RxID:   [id number] PRILOSEC 20 MG  CPDR (OMEPRAZOLE) one by mouth daily  #90 x 6   Entered and Authorized by:   Gordy Savers  MD   Signed by:   Gordy Savers  MD on 10/05/2007   Method used:   Print then Give to Patient   RxID:   [id number] HYDROCHLOROTHIAZIDE 25 MG  TABS (HYDROCHLOROTHIAZIDE) one by mouth daily  #90 x 6   Entered and Authorized by:   Gordy Savers  MD   Signed by:   Gordy Savers  MD on 10/05/2007   Method used:   Print then Give to Patient   RxID:   [id number] PREMARIN 0.625 MG/GM  CREA (ESTROGENS, CONJUGATED) use vaginally three times daily for 2 weeks, then two times weekly  #3 x 6   Entered and Authorized by:   Gordy Savers  MD   Signed by:   Gordy Savers  MD on 10/05/2007   Method used:   Print then Give to Patient   RxID:   [id number] LORAZEPAM 0.5 MG TABS (LORAZEPAM)   #90 x 6   Entered and Authorized by:   Gordy Savers  MD   Signed by:   Gordy Savers  MD on 10/05/2007   Method used:   Print then Give to Patient   RxID:   [id number] CELEBREX 200 MG CAPS (CELECOXIB) one by mouth daily  #90 x 6   Entered and Authorized by:    Gordy Savers  MD   Signed by:   Gordy Savers  MD on 10/05/2007   Method used:   Print then Give to Patient   RxID:   [id number] BENAZEPRIL HCL 40 MG TABS (BENAZEPRIL HCL) one by mouth daily  #90 x 6   Entered and Authorized by:   Gordy Savers  MD   Signed by:   Gordy Savers  MD on 10/05/2007   Method used:   Print then Give to Patient   RxID:   [id number]  ]

## 2010-08-16 NOTE — Assessment & Plan Note (Signed)
Summary: 3 month rov/njr   Vital Signs:  Patient profile:   77 year old female Weight:      202 pounds Temp:     97.9 degrees F oral BP sitting:   150 / 80  (left arm) Cuff size:   regular  Vitals Entered By: Duard Brady LPN (November 08, 2009 10:39 AM) CC: 3 mos rov - c/o burning with urination, c/o (L) lower leg pain and small wound to inner (L) leg Is Patient Diabetic? No   Primary Care Provider:  Gordy Savers  MD  CC:  3 mos rov - c/o burning with urination and c/o (L) lower leg pain and small wound to inner (L) leg.  History of Present Illness: 77 year old patient who is seen today for follow up.  She is on some burning dysuria for several days.  Additionally, she also has a soft tissue infection involving her left medial lower leg from a puncture wound from a rose bush  a few weeks ago.  Her main complaint is arthritic pain involving hips and knees primarily.  She has treated hypertension, history of diastolic heart failure, and dyslipidemia.  She remains on simvastatin, which she continues to tolerate well.  She has gastroesophageal reflux disease well controlled on Prilosec.  Denies any symptoms of heart failure  Preventive Screening-Counseling & Management  Alcohol-Tobacco     Smoking Status: never  Allergies (verified): No Known Drug Allergies  Past History:  Past Medical History: Reviewed history from 02/27/2009 and no changes required. Hypertension Advanced DJD Colonic polyps, hx of Diverticulosis, colon GERD w/esophageal strictures Hiatal hernia Coronary artery disease      -s/p BMS RCA 4/09 Diastolic HF  Past Surgical History: Reviewed history from 03/29/2009 and no changes required. Appendectomy Cholecystectomy Hysterectomy Lumbar laminectomy Total knee replacement-bilateral; status post left total knee  replacement x 3 Rotator cuff repair status post cardiac stents, April 2009 s/p Cervical fusion 01-2009 Otelia Sergeant)  Family  History: Reviewed history from 04/05/2008 and no changes required. father died age 59 and her heart condition mother died age 42, heart failure, and significant osteoarthritis  Four brothers three half brothers Posicor R. disease with renal failure three half sisters  Review of Systems       The patient complains of suspicious skin lesions and difficulty walking.  The patient denies anorexia, fever, weight loss, weight gain, vision loss, decreased hearing, hoarseness, chest pain, syncope, dyspnea on exertion, peripheral edema, prolonged cough, headaches, hemoptysis, abdominal pain, melena, hematochezia, severe indigestion/heartburn, hematuria, incontinence, genital sores, muscle weakness, transient blindness, depression, unusual weight change, abnormal bleeding, enlarged lymph nodes, angioedema, and breast masses.    Physical Exam  General:  overweight-appearing.  no distress.  Afebrile.  Blood pressure 140/80overweight-appearing.   Head:  Normocephalic and atraumatic without obvious abnormalities. No apparent alopecia or balding. Eyes:  No corneal or conjunctival inflammation noted. EOMI. Perrla. Funduscopic exam benign, without hemorrhages, exudates or papilledema. Vision grossly normal. Mouth:  Oral mucosa and oropharynx without lesions or exudates.  Teeth in good repair. Neck:  No deformities, masses, or tenderness noted. Lungs:  Normal respiratory effort, chest expands symmetrically. Lungs are clear to auscultation, no crackles or wheezes. Heart:  Normal rate and regular rhythm. S1 and S2 normal without gallop, murmur, click, rub or other extra sounds. Abdomen:  Bowel sounds positive,abdomen soft and non-tender without masses, organomegaly or hernias noted. Msk:  No deformity or scoliosis noted of thoracic or lumbar spine.   Pulses:  R and L carotid,radial,femoral,dorsalis pedis and  posterior tibial pulses are full and equal bilaterally Extremities:  both the -knees  warm to touch;  diminished range of motion.  Right hip Skin:  small puncture wound left lower medial leg with surrounding rim of erythema.  No drainage   Impression & Recommendations:  Problem # 1:  INFECTION, SKIN AND SOFT TISSUE (ICD-686.9)  Problem # 2:  UTI (ICD-599.0)  Her updated medication list for this problem includes:    Zithromax Z-pak 250 Mg Tabs (Azithromycin) .Marland Kitchen... As directed.    Ciprofloxacin Hcl 500 Mg Tabs (Ciprofloxacin hcl) ..... One twice daily for 10 days  Her updated medication list for this problem includes:    Zithromax Z-pak 250 Mg Tabs (Azithromycin) .Marland Kitchen... As directed.    Ciprofloxacin Hcl 500 Mg Tabs (Ciprofloxacin hcl) ..... One twice daily for 10 days  Problem # 3:  DIASTOLIC HEART FAILURE, CHRONIC (ICD-428.32)  Her updated medication list for this problem includes:    Benazepril Hcl 40 Mg Tabs (Benazepril hcl) ..... One by mouth daily    Bayer Aspirin 325 Mg Tabs (Aspirin) .Marland Kitchen... 1 once daily    Metoprolol Tartrate 50 Mg Tabs (Metoprolol tartrate) .Marland Kitchen... 1 two times a day    Spironolactone 25 Mg Tabs (Spironolactone) .Marland Kitchen... 1 once daily  Her updated medication list for this problem includes:    Benazepril Hcl 40 Mg Tabs (Benazepril hcl) ..... One by mouth daily    Bayer Aspirin 325 Mg Tabs (Aspirin) .Marland Kitchen... 1 once daily    Metoprolol Tartrate 50 Mg Tabs (Metoprolol tartrate) .Marland Kitchen... 1 two times a day    Spironolactone 25 Mg Tabs (Spironolactone) .Marland Kitchen... 1 once daily  Problem # 4:  PURE HYPERCHOLESTEROLEMIA (ICD-272.0)  Her updated medication list for this problem includes:    Simvastatin 40 Mg Tabs (Simvastatin) .Marland Kitchen... 1 once daily  Her updated medication list for this problem includes:    Simvastatin 40 Mg Tabs (Simvastatin) .Marland Kitchen... 1 once daily  Problem # 5:  DEGENERATIVE JOINT DISEASE (ICD-715.90)  Her updated medication list for this problem includes:    Celebrex 200 Mg Caps (Celecoxib) ..... One by mouth daily    Hydrocodone-acetaminophen 5-500 Mg Tabs  (Hydrocodone-acetaminophen) ..... One every 6 hs for pain    Bayer Aspirin 325 Mg Tabs (Aspirin) .Marland Kitchen... 1 once daily  Her updated medication list for this problem includes:    Celebrex 200 Mg Caps (Celecoxib) ..... One by mouth daily    Hydrocodone-acetaminophen 5-500 Mg Tabs (Hydrocodone-acetaminophen) ..... One every 6 hs for pain    Bayer Aspirin 325 Mg Tabs (Aspirin) .Marland Kitchen... 1 once daily  Complete Medication List: 1)  Benazepril Hcl 40 Mg Tabs (Benazepril hcl) .... One by mouth daily 2)  Celebrex 200 Mg Caps (Celecoxib) .... One by mouth daily 3)  Lorazepam 0.5 Mg Tabs (Lorazepam) .Marland Kitchen.. 1 q8h as needed anxiety 4)  Premarin 0.625 Mg/gm Crea (Estrogens, conjugated) .... As needed 5)  Prilosec 20 Mg Cpdr (Omeprazole) .... One by mouth daily 6)  Neurontin 600 Mg Tabs (Gabapentin) .... One tid 7)  Hydrocodone-acetaminophen 5-500 Mg Tabs (Hydrocodone-acetaminophen) .... One every 6 hs for pain 8)  Trazodone Hcl 100 Mg Tabs (Trazodone hcl) .... 1/2 at bedtime 9)  Bayer Aspirin 325 Mg Tabs (Aspirin) .Marland Kitchen.. 1 once daily 10)  Metoprolol Tartrate 50 Mg Tabs (Metoprolol tartrate) .Marland Kitchen.. 1 two times a day 11)  Simvastatin 40 Mg Tabs (Simvastatin) .Marland Kitchen.. 1 once daily 12)  Nitrostat 0.4 Mg Subl (Nitroglycerin) .... Use as needed 13)  Spironolactone 25 Mg Tabs (Spironolactone) .Marland KitchenMarland KitchenMarland Kitchen  1 once daily 14)  Zithromax Z-pak 250 Mg Tabs (Azithromycin) .... As directed. 15)  Ciprofloxacin Hcl 500 Mg Tabs (Ciprofloxacin hcl) .... One twice daily for 10 days  Other Orders: UA Dipstick w/o Micro (manual) (16[address])  Patient Instructions: 1)  Please schedule a follow-up appointment in 3 months. 2)  Limit your Sodium (Salt). 3)  You need to lose weight. Consider a lower calorie diet and regular exercise.  4)  Take your antibiotic as prescribed until ALL of it is gone, but stop if you develop a rash or swelling and contact our office as soon as possible. Prescriptions: CIPROFLOXACIN HCL 500 MG TABS (CIPROFLOXACIN HCL) one  twice daily for 10 days  #20 x 0   Entered and Authorized by:   Gordy Savers  MD   Signed by:   Gordy Savers  MD on 11/08/2009   Method used:   Print then Give to Patient   RxID:   [id number] SPIRONOLACTONE 25 MG TABS (SPIRONOLACTONE) 1 once daily  #90 x 3   Entered and Authorized by:   Gordy Savers  MD   Signed by:   Gordy Savers  MD on 11/08/2009   Method used:   Print then Give to Patient   RxID:   [id number] SIMVASTATIN 40 MG TABS (SIMVASTATIN) 1 once daily  #90 x 6   Entered and Authorized by:   Gordy Savers  MD   Signed by:   Gordy Savers  MD on 11/08/2009   Method used:   Print then Give to Patient   RxID:   [id number] METOPROLOL TARTRATE 50 MG TABS (METOPROLOL TARTRATE) 1 two times a day  #180 x 6   Entered and Authorized by:   Gordy Savers  MD   Signed by:   Gordy Savers  MD on 11/08/2009   Method used:   Print then Give to Patient   RxID:   [id number] TRAZODONE HCL 100 MG  TABS (TRAZODONE HCL) 1/2 at bedtime  #90 x 6   Entered and Authorized by:   Gordy Savers  MD   Signed by:   Gordy Savers  MD on 11/08/2009   Method used:   Print then Give to Patient   RxID:   [id number] HYDROCODONE-ACETAMINOPHEN 5-500 MG  TABS (HYDROCODONE-ACETAMINOPHEN) one every 6 hs for pain  #90 x 6   Entered and Authorized by:   Gordy Savers  MD   Signed by:   Gordy Savers  MD on 11/08/2009   Method used:   Print then Give to Patient   RxID:   [id number] NEURONTIN 600 MG  TABS (GABAPENTIN) one tid  #270 x 6   Entered and Authorized by:   Gordy Savers  MD   Signed by:   Gordy Savers  MD on 11/08/2009   Method used:   Print then Give to Patient   RxID:   [id number] PRILOSEC 20 MG  CPDR (OMEPRAZOLE) one by mouth daily  #90 x 6   Entered and Authorized by:   Gordy Savers  MD   Signed by:   Gordy Savers  MD on 11/08/2009   Method used:   Print then  Give to Patient   RxID:   [id number] LORAZEPAM 0.5 MG TABS (LORAZEPAM) 1 q8h as needed anxiety  #60 x 4   Entered and Authorized by:   Gordy Savers  MD   Signed by:   Janett Labella  Amador Cunas  MD on 11/08/2009   Method used:   Print then Give to Patient   RxID:   [id number] CELEBREX 200 MG CAPS (CELECOXIB) one by mouth daily  #90 x 6   Entered and Authorized by:   Gordy Savers  MD   Signed by:   Gordy Savers  MD on 11/08/2009   Method used:   Print then Give to Patient   RxID:   [id number] BENAZEPRIL HCL 40 MG TABS (BENAZEPRIL HCL) one by mouth daily  #90 x 6   Entered and Authorized by:   Gordy Savers  MD   Signed by:   Gordy Savers  MD on 11/08/2009   Method used:   Print then Give to Patient   RxID:   [id number]   Laboratory Results   Urine Tests  Date/Time Received: November 08, 2009 10:45 AM  Date/Time Reported: November 08, 2009 10:45 AM   Routine Urinalysis   Color: yellow Appearance: Hazy Glucose: negative   (Normal Range: Negative) Bilirubin: negative   (Normal Range: Negative) Ketone: negative   (Normal Range: Negative) Spec. Gravity: 1.020   (Normal Range: 1.003-1.035) Blood: trace-lysed   (Normal Range: Negative) pH: 5.0   (Normal Range: 5.0-8.0) Protein: trace   (Normal Range: Negative) Urobilinogen: 0.2   (Normal Range: 0-1) Nitrite: negative   (Normal Range: Negative) Leukocyte Esterace: small   (Normal Range: Negative)

## 2010-08-16 NOTE — Assessment & Plan Note (Signed)
Summary: 6 month f/up//db   Vital Signs:  Patient profile:   77 year old female Weight:      208 pounds Temp:     98.2 degrees F oral BP sitting:   150 / 78  (left arm) Cuff size:   regular  Vitals Entered By: Raechel Ache, RN (September 27, 2008 10:31 AM) CC: Abdominal Pain Is Patient Diabetic? No   History of Present Illness: 77 year old female seen today for follow-up.  She has a history of coronary artery disease and is now 11 months status post bare-metal stenting of the RCA.  Plavix was discontinued approximately 3 months ago.  Her cardiac status has been stable.  Aldactone has been substituted for HCTZ two to her history of diastolic heart failure. The patient remains on Celebrex for advanced DJD.  Unfortunately, naproxen, which is less thrombogenic.  Has been both ineffective, and has caused GI intolerance. She has treated hypertension, and has done well on statin therapy.  In addition to the Aldactone.  She is on ACE inhibition, and potassium supplementation.  She did have follow-up electrolytes, approximately 1 week following initiation of Aldactone.  Dyspepsia History:      There is a prior history of GERD.     Allergies: No Known Drug Allergies  Past History:  Past Medical History:    Hypertension    Advanced DJD    Colonic polyps, hx of    Diverticulosis, colon    GERD w/esophageal strictures    Hiatal hernia    Coronary artery disease     (04/05/2008)  Family History:    father died age 58 and her heart condition    mother died age 40, heart failure, and significant osteoarthritis    Four brothers three half brothers Posicor R. disease with renal failure    three half sisters (04/05/2008)  Family History:    Reviewed history from 04/05/2008 and no changes required:       father died age 45 and her heart condition       mother died age 51, heart failure, and significant osteoarthritis              Four brothers three half brothers Posicor R. disease with  renal failure       three half sisters  Review of Systems  The patient denies anorexia, fever, weight loss, weight gain, vision loss, decreased hearing, hoarseness, chest pain, syncope, dyspnea on exertion, peripheral edema, prolonged cough, headaches, hemoptysis, abdominal pain, melena, hematochezia, severe indigestion/heartburn, hematuria, incontinence, genital sores, muscle weakness, suspicious skin lesions, transient blindness, difficulty walking, depression, unusual weight change, abnormal bleeding, enlarged lymph nodes, angioedema, and breast masses.    Physical Exam  General:  overweight-appearing.  140/80 Head:  Normocephalic and atraumatic without obvious abnormalities. No apparent alopecia or balding. Eyes:  No corneal or conjunctival inflammation noted. EOMI. Perrla. Funduscopic exam benign, without hemorrhages, exudates or papilledema. Vision grossly normal. Mouth:  Oral mucosa and oropharynx without lesions or exudates.  Teeth in good repair. Neck:  No deformities, masses, or tenderness noted. Lungs:  Normal respiratory effort, chest expands symmetrically. Lungs are clear to auscultation, no crackles or wheezes. Heart:  Normal rate and regular rhythm. S1 and S2 normal without gallop, murmur, click, rub or other extra sounds. Abdomen:  Bowel sounds positive,abdomen soft and non-tender without masses, organomegaly or hernias noted. Msk:  No deformity or scoliosis noted of thoracic or lumbar spine.   Extremities:  no clinical edema   Impression & Recommendations:  Problem # 1:  PURE HYPERCHOLESTEROLEMIA (ICD-272.0)  Her updated medication list for this problem includes:    Simvastatin 40 Mg Tabs (Simvastatin) .Marland Kitchen... 1 once daily  Problem # 2:  CORONARY ARTERY DISEASE (ICD-414.00)  The following medications were removed from the medication list:    Hydrochlorothiazide 25 Mg Tabs (Hydrochlorothiazide) ..... One by mouth daily    Plavix 75 Mg Tabs (Clopidogrel bisulfate) .Marland Kitchen... 1  once daily Her updated medication list for this problem includes:    Benazepril Hcl 40 Mg Tabs (Benazepril hcl) ..... One by mouth daily    Bayer Aspirin 325 Mg Tabs (Aspirin) .Marland Kitchen... 1 once daily    Metoprolol Tartrate 50 Mg Tabs (Metoprolol tartrate) .Marland Kitchen... 1 two times a day    Nitrostat 0.4 Mg Subl (Nitroglycerin) ..... Use as needed    Spironolactone 25 Mg Tabs (Spironolactone) .Marland Kitchen... 1 once daily    Furosemide 40 Mg Tabs (Furosemide) ..... One daily, as needed for fluid retention  Orders: TLB-BMP (Basic Metabolic Panel-BMET) (80048-METABOL)  Problem # 3:  DEGENERATIVE JOINT DISEASE (ICD-715.90)  The following medications were removed from the medication list:    Propoxyphene N-apap 100-650 Mg Tabs (Propoxyphene n-apap) ..... One every 6 hours for pain Her updated medication list for this problem includes:    Celebrex 200 Mg Caps (Celecoxib) ..... One by mouth daily    Hydrocodone-acetaminophen 5-500 Mg Tabs (Hydrocodone-acetaminophen) ..... One every 6 hs for pain    Bayer Aspirin 325 Mg Tabs (Aspirin) .Marland Kitchen... 1 once daily  Problem # 4:  HYPERTENSION (ICD-401.9)  The following medications were removed from the medication list:    Hydrochlorothiazide 25 Mg Tabs (Hydrochlorothiazide) ..... One by mouth daily Her updated medication list for this problem includes:    Benazepril Hcl 40 Mg Tabs (Benazepril hcl) ..... One by mouth daily    Metoprolol Tartrate 50 Mg Tabs (Metoprolol tartrate) .Marland Kitchen... 1 two times a day    Spironolactone 25 Mg Tabs (Spironolactone) .Marland Kitchen... 1 once daily    Furosemide 40 Mg Tabs (Furosemide) ..... One daily, as needed for fluid retention  Orders: TLB-BMP (Basic Metabolic Panel-BMET) (80048-METABOL)  Complete Medication List: 1)  Benazepril Hcl 40 Mg Tabs (Benazepril hcl) .... One by mouth daily 2)  Celebrex 200 Mg Caps (Celecoxib) .... One by mouth daily 3)  Lorazepam 0.5 Mg Tabs (Lorazepam) .Marland Kitchen.. 1 q8h as needed anxiety 4)  Premarin 0.625 Mg/gm Crea (Estrogens,  conjugated) .... Use vaginally three times daily for 2 weeks, then two times weekly 5)  Prilosec 20 Mg Cpdr (Omeprazole) .... One by mouth daily 6)  Klor-con M20 20 Meq Tbcr (Potassium chloride crys cr) .... One by mouth daily 7)  Neurontin 600 Mg Tabs (Gabapentin) .... One tid 8)  Hydrocodone-acetaminophen 5-500 Mg Tabs (Hydrocodone-acetaminophen) .... One every 6 hs for pain 9)  Trazodone Hcl 100 Mg Tabs (Trazodone hcl) .... 1/2 at bedtime 10)  Bayer Aspirin 325 Mg Tabs (Aspirin) .Marland Kitchen.. 1 once daily 11)  Metoprolol Tartrate 50 Mg Tabs (Metoprolol tartrate) .Marland Kitchen.. 1 two times a day 12)  Simvastatin 40 Mg Tabs (Simvastatin) .Marland Kitchen.. 1 once daily 13)  Nitrostat 0.4 Mg Subl (Nitroglycerin) .... Use as needed 14)  Spironolactone 25 Mg Tabs (Spironolactone) .Marland Kitchen.. 1 once daily 15)  Furosemide 40 Mg Tabs (Furosemide) .... One daily, as needed for fluid retention  Other Orders: Venipuncture (81191) TLB-CBC Platelet - w/Differential (85025-CBCD)  Patient Instructions: 1)  Please schedule a follow-up appointment in 6 months. 2)  Limit your Sodium (Salt). 3)  It is  important that you exercise regularly at least 20 minutes 5 times a week. If you develop chest pain, have severe difficulty breathing, or feel very tired , stop exercising immediately and seek medical attention. 4)  You need to lose weight. Consider a lower calorie diet and regular exercise.  5)  Take calcium +Vitamin D daily. 6)  Take an Aspirin every day. Prescriptions: SIMVASTATIN 40 MG TABS (SIMVASTATIN) 1 once daily  #90 x 6   Entered and Authorized by:   Gordy Savers  MD   Signed by:   Gordy Savers  MD on 09/27/2008   Method used:   Print then Give to Patient   RxID:   [id number] METOPROLOL TARTRATE 50 MG TABS (METOPROLOL TARTRATE) 1 two times a day  #180 x 6   Entered and Authorized by:   Gordy Savers  MD   Signed by:   Gordy Savers  MD on 09/27/2008   Method used:   Print then Give to Patient    RxID:   [id number] TRAZODONE HCL 100 MG  TABS (TRAZODONE HCL) 1/2 at bedtime  #90 x 6   Entered and Authorized by:   Gordy Savers  MD   Signed by:   Gordy Savers  MD on 09/27/2008   Method used:   Print then Give to Patient   RxID:   [id number] HYDROCODONE-ACETAMINOPHEN 5-500 MG  TABS (HYDROCODONE-ACETAMINOPHEN) one every 6 hs for pain  #90 x 6   Entered and Authorized by:   Gordy Savers  MD   Signed by:   Gordy Savers  MD on 09/27/2008   Method used:   Print then Give to Patient   RxID:   [id number] NEURONTIN 600 MG  TABS (GABAPENTIN) one tid  #270 x 6   Entered and Authorized by:   Gordy Savers  MD   Signed by:   Gordy Savers  MD on 09/27/2008   Method used:   Print then Give to Patient   RxID:   [id number] PRILOSEC 20 MG  CPDR (OMEPRAZOLE) one by mouth daily  #90 x 6   Entered and Authorized by:   Gordy Savers  MD   Signed by:   Gordy Savers  MD on 09/27/2008   Method used:   Print then Give to Patient   RxID:   [id number] PREMARIN 0.625 MG/GM  CREA (ESTROGENS, CONJUGATED) use vaginally three times daily for 2 weeks, then two times weekly  #3 x 6   Entered and Authorized by:   Gordy Savers  MD   Signed by:   Gordy Savers  MD on 09/27/2008   Method used:   Print then Give to Patient   RxID:   [id number] LORAZEPAM 0.5 MG TABS (LORAZEPAM) 1 q8h as needed anxiety  #60 x 4   Entered and Authorized by:   Gordy Savers  MD   Signed by:   Gordy Savers  MD on 09/27/2008   Method used:   Print then Give to Patient   RxID:   [id number] CELEBREX 200 MG CAPS (CELECOXIB) one by mouth daily  #90 x 6   Entered and Authorized by:   Gordy Savers  MD   Signed by:   Gordy Savers  MD on 09/27/2008   Method used:   Print then Give to Patient   RxID:   [id number] BENAZEPRIL HCL 40 MG TABS (BENAZEPRIL HCL) one by mouth daily  #  90 x 6   Entered and Authorized  by:   Gordy Savers  MD   Signed by:   Gordy Savers  MD on 09/27/2008   Method used:   Print then Give to Patient   RxID:   [id number]

## 2010-08-16 NOTE — Assessment & Plan Note (Signed)
Summary: chest congestion/low grade fever/njr   Vital Signs:  [name] profile:   77 year old female Weight:      198 pounds Temp:     98.1 degrees F oral BP sitting:   160 / 90  (right arm) Cuff size:   regular  Vitals Entered By: Duard Brady LPN (March 23, 2010 10:12 AM) CC: c/o congestion worse - cough non-productive , temp 102.0 last night Is [name] Diabetic? No   Primary Care Malloree Raboin:  Gordy Savers  MD  CC:  c/o congestion worse - cough non-productive  and temp 102.0 last night.  History of Present Illness: [name] who is seen today for follow-up.  She is followed closely by cardiology for diastolic heart failure.  She has a history of advanced osteoarthritis, and has treated hypertension, and dyslipidemia.  For the past 4 weeks.  She has had hoarseness, chest congestion, and cough.  A chest x-ray was obtained 9 days ago and revealed no acute infiltrate.  For the past several days.  She has had some nocturnal fever no chills.  Cough is not too bothersome for the day, but does have a slight nonproductive nocturnal cough, medical regimen includes ACE inhibition.  There is no sputum production.  Denies any wheezing  Allergies (verified): No Known Drug Allergies  Past History:  Past Medical History: Reviewed history from 02/07/2010 and no changes required. Hypertension Advanced DJD Colonic polyps, hx of Diverticulosis, colon GERD w/esophageal strictures Hiatal hernia Coronary artery disease      -s/p BMS RCA 4/09 Diastolic HF  Depression  Review of Systems       The [name] complains of anorexia, hoarseness, and prolonged cough.  The [name] denies fever, weight loss, weight gain, vision loss, decreased hearing, chest pain, syncope, dyspnea on exertion, peripheral edema, headaches, hemoptysis, abdominal pain, melena, hematochezia, severe indigestion/heartburn, hematuria, incontinence, genital sores, muscle weakness, suspicious skin lesions,  transient blindness, difficulty walking, depression, unusual weight change, abnormal bleeding, enlarged lymph nodes, angioedema, and breast masses.    Physical Exam  General:  overweight-appearing.  no distress.  Blood pressure normal, afebrile blood pressure 140/80overweight-appearing.   Head:  Normocephalic and atraumatic without obvious abnormalities. No apparent alopecia or balding. Eyes:  No corneal or conjunctival inflammation noted. EOMI. Perrla. Funduscopic exam benign, without hemorrhages, exudates or papilledema. Vision grossly normal. Ears:  External ear exam shows no significant lesions or deformities.  Otoscopic examination reveals clear canals, tympanic membranes are intact bilaterally without bulging, retraction, inflammation or discharge. Hearing is grossly normal bilaterally. Mouth:  Oral mucosa and oropharynx without lesions or exudates.  Teeth in good repair. Neck:  No deformities, masses, or tenderness noted. Lungs:  Normal respiratory effort, chest expands symmetrically. Lungs are clear to auscultation, no crackles or wheezes.  O2 saturation 97% Heart:  Normal rate and regular rhythm. S1 and S2 normal without gallop, murmur, click, rub or other extra sounds.  pulse rate 68 Abdomen:  Bowel sounds positive,abdomen soft and non-tender without masses, organomegaly or hernias noted. Msk:  No deformity or scoliosis noted of thoracic or lumbar spine.   Extremities:  No clubbing, cyanosis, edema, or deformity noted with normal full range of motion of all joints.     Impression & Recommendations:  Problem # 1:  COUGH (ICD-786.2)  Problem # 2:  URI (ICD-465.9)  Her updated medication list for this problem includes:    Celebrex 200 Mg Caps (Celecoxib) ..... One by mouth daily    Bayer Aspirin 325 Mg Tabs (  Aspirin) .Marland Kitchen... 1 once daily    Hydrocodone-homatropine 5-1.5 Mg/21ml Syrp (Hydrocodone-homatropine) .Marland Kitchen... 1 teaspoon every 6 hours as needed for cough or congestion  Her  updated medication list for this problem includes:    Celebrex 200 Mg Caps (Celecoxib) ..... One by mouth daily    Bayer Aspirin 325 Mg Tabs (Aspirin) .Marland Kitchen... 1 once daily    Hydrocodone-homatropine 5-1.5 Mg/16ml Syrp (Hydrocodone-homatropine) .Marland Kitchen... 1 teaspoon every 6 hours as needed for cough or congestion  Complete Medication List: 1)  Benazepril Hcl 40 Mg Tabs (Benazepril hcl) .... One by mouth daily 2)  Celebrex 200 Mg Caps (Celecoxib) .... One by mouth daily 3)  Lorazepam 0.5 Mg Tabs (Lorazepam) .Marland Kitchen.. 1 q8h as needed anxiety 4)  Premarin 0.625 Mg/gm Crea (Estrogens, conjugated) .... As needed 5)  Prilosec 20 Mg Cpdr (Omeprazole) .... One by mouth daily 6)  Neurontin 600 Mg Tabs (Gabapentin) .... One tid 7)  Hydrocodone-acetaminophen 5-500 Mg Tabs (Hydrocodone-acetaminophen) .... One every 6 hs for pain 8)  Trazodone Hcl 100 Mg Tabs (Trazodone hcl) .... 1/2 at bedtime 9)  Bayer Aspirin 325 Mg Tabs (Aspirin) .Marland Kitchen.. 1 once daily 10)  Metoprolol Tartrate 50 Mg Tabs (Metoprolol tartrate) .Marland Kitchen.. 1 two times a day 11)  Simvastatin 40 Mg Tabs (Simvastatin) .Marland Kitchen.. 1 once daily 12)  Nitrostat 0.4 Mg Subl (Nitroglycerin) .... Use as needed 13)  Spironolactone 25 Mg Tabs (Spironolactone) .Marland Kitchen.. 1 once daily 14)  Sertraline Hcl 50 Mg Tabs (Sertraline hcl) .... One every am/  pt not taking every day as directed 15)  Furosemide 20 Mg Tabs (Furosemide) .... Take one tablet by mouth daily as needed 16)  Hydrocodone-homatropine 5-1.5 Mg/39ml Syrp (Hydrocodone-homatropine) .Marland Kitchen.. 1 teaspoon every 6 hours as needed for cough or congestion  [name] Instructions: 1)  Take 650-1000mg  of Tylenol every 4-6 hours as needed for relief of pain or comfort of fever AVOID taking more than 4000mg   in a 24 hour period (can cause liver damage in higher doses). 2)  cough medicine as directed 3)  Drink as much fluid as you can tolerate for the next few days. Prescriptions: HYDROCODONE-HOMATROPINE 5-1.5 MG/5ML SYRP  (HYDROCODONE-HOMATROPINE) 1 teaspoon every 6 hours as needed for cough or congestion  #6 oz x 1   Entered and Authorized by:   Gordy Savers  MD   Signed by:   Gordy Savers  MD on 03/23/2010   Method used:   Print then Give to [name]   RxID:   [id number] FUROSEMIDE 20 MG TABS (FUROSEMIDE) Take one tablet by mouth daily as needed  #30 x 6   Entered and Authorized by:   Gordy Savers  MD   Signed by:   Gordy Savers  MD on 03/23/2010   Method used:   Electronically to        [facility]  [address] [phone]* (retail)       [address]       [place], [place]  [address]       Ph: [phone]       Fax: [phone]   RxID:   [id number] SERTRALINE HCL 50 MG TABS (SERTRALINE HCL) One every am/  pt not taking every day as directed  #90 x 6   Entered and Authorized by:   Gordy Savers  MD   Signed by:   Gordy Savers  MD on 03/23/2010   Method used:   Electronically to  [facility]  [address] [id number] (retail)       [address]       [place], [place]  [address]       Ph: [phone]       Fax: [phone]   RxID:   [id number] SPIRONOLACTONE 25 MG TABS (SPIRONOLACTONE) 1 once daily  #90 x 3   Entered and Authorized by:   Gordy Savers  MD   Signed by:   Gordy Savers  MD on 03/23/2010   Method used:   Electronically to        [facility]  [address] [phone]* (retail)       [address]       [place], [place]  [address]       Ph: [phone]       Fax: [phone]   RxID:   [id number] SIMVASTATIN 40 MG TABS (SIMVASTATIN) 1 once daily  #90 x 6   Entered and Authorized by:   Gordy Savers  MD   Signed by:   Gordy Savers  MD on 03/23/2010   Method used:   Electronically to        [facility]  [address] [phone]* (retail)       [address]       [place], [place]  [address]       Ph: [phone]       Fax: [phone]   RxID:    [id number] METOPROLOL TARTRATE 50 MG TABS (METOPROLOL TARTRATE) 1 two times a day  #180 x 6   Entered and Authorized by:   Gordy Savers  MD   Signed by:   Gordy Savers  MD on 03/23/2010   Method used:   Electronically to        [facility] [phone]* (retail)       [address]       [place], [place]  [address]       Ph: [phone]       Fax: [phone]   RxID:   [id number] NEURONTIN 600 MG  TABS (GABAPENTIN) one tid  #270 x 6   Entered and Authorized by:   Gordy Savers  MD   Signed by:   Gordy Savers  MD on 03/23/2010   Method used:   Electronically to        [facility]  [address] [phone]* (retail)       [address]       [place], [place]  [address]       Ph: [phone]       Fax: [phone]   RxID:   [id number] PRILOSEC 20 MG  CPDR (OMEPRAZOLE) one by mouth daily  #90 x 6   Entered and Authorized by:   Gordy Savers  MD   Signed by:   Gordy Savers  MD on 03/23/2010   Method used:   Electronically to        [facility]  [address] [phone]* (retail)       [address]       [place], [place]  [address]       Ph: [phone]  Fax: [phone]   RxID:   [id number] CELEBREX 200 MG CAPS (CELECOXIB) one by mouth daily  #90 x 6   Entered and Authorized by:   Gordy Savers  MD   Signed by:   Gordy Savers  MD on 03/23/2010   Method used:   Electronically to        [facility]  [address] [phone]* (retail)       [address]       [place], [place]  [address]       Ph: [phone]       Fax: [phone]   RxID:   [id number] BENAZEPRIL HCL 40 MG TABS (BENAZEPRIL HCL) one by mouth daily  #90 x 6   Entered and Authorized by:   Gordy Savers  MD   Signed by:   Gordy Savers  MD on 03/23/2010   Method used:   Electronically to        [facility] [phone]* (retail)       [address]        [place], [place]  [address]       Ph: [phone]       Fax: [phone]   RxID:   [id number]

## 2010-08-16 NOTE — Progress Notes (Signed)
Summary: returning your call   Phone Note Call from Patient Call back at Home Phone [phone]   Caller: patient vm triage Call For: k  Summary of Call: returning your call  Initial call taken by: Roselle Locus,  October 08, 2007 3:44 PM  Follow-up for Phone Call        Phone Call Completed Follow-up by: Raechel Ache, RN,  October 08, 2007 3:48 PM         Appended Document: returning your call  04/22 @ 11:15 with Dr. Ladora Daniel by patient

## 2010-08-16 NOTE — Progress Notes (Signed)
Summary: WAS IN LAST MONDAY AND FEELS WORSE  Phone Note Call from Patient Call back at Home Phone [phone]   Caller: PATIENT LIVE Call For: k  Summary of Call: WAS IN LAST MONDAY.  THE COLD OR WHAT EVER IT IS IS MUCH MUCH WORSE.  MORE CONGESTED COUGH CONSTANTLY,  COUGHING UP GREY WHITE PHLEM.  DOESN'T KNOW IF RUNNING A FEVER.  THROAT SORE.  [facility] [address]   Initial call taken by: Roselle Locus,  October 12, 2007 8:39 AM  Follow-up for Phone Call        doxycycline 100  #20 one twice daily Follow-up by: Gordy Savers  MD,  October 12, 2007 12:38 PM    New/Updated Medications: DOXYCYCLINE HYCLATE 100 MG  TABS (DOXYCYCLINE HYCLATE) one by mouth bid   Prescriptions: DOXYCYCLINE HYCLATE 100 MG  TABS (DOXYCYCLINE HYCLATE) one by mouth bid  #20 x 0   Entered by:   Lynann Beaver CMA   Authorized by:   Gordy Savers  MD   Signed by:   Lynann Beaver CMA on 10/12/2007   Method used:   Electronically sent to ...       [facility] [id number]*       [address]       [place], [place]  [address]       Ph: [phone]       Fax: [phone]   RxID:   [id number]

## 2010-08-16 NOTE — Assessment & Plan Note (Signed)
Summary: 6 MONTH ROV/NJR   Vital Signs:  Patient profile:   77 year old female Weight:      202 pounds BMI:     50.74 Temp:     97.9 degrees F oral BP sitting:   148 / 82  (left arm) Cuff size:   regular  Vitals Entered By: Raechel Ache, RN (March 29, 2009 10:32 AM) CC: 6 mo ROV; had neck surgery in July.   Primary Care Provider:  Gordy Savers  MD  CC:  6 mo ROV; had neck surgery in July.[name]  History of Present Illness: 77 year old patient who is seen today for follow-up.  She is status post cervical fusion two months ago.  She has DJD, hypertension, diastolic heart failure.  She has treated dyslipidemia.  She is in an aspen cervical collar and improving nicely from surgery.  Her blood pressure has been stable.  She is having some ongoing neck pain that does seem to be improving.  She does request a refill of her analgesic.  She is also requesting a muscle relaxer, but does take lorazepam at night.  She was told this is a reasonably good.  Most relaxer and will continue using this only  Allergies: No Known Drug Allergies  Past History:  Past Medical History: Reviewed history from 02/27/2009 and no changes required. Hypertension Advanced DJD Colonic polyps, hx of Diverticulosis, colon GERD w/esophageal strictures Hiatal hernia Coronary artery disease      -s/p BMS RCA 4/09 Diastolic HF  Past Surgical History: Appendectomy Cholecystectomy Hysterectomy Lumbar laminectomy Total knee replacement-bilateral; status post left total knee  replacement x 3 Rotator cuff repair status post cardiac stents, April 2009 s/p Cervical fusion 01-2009 (Nitka)  Review of Systems       The patient complains of muscle weakness and difficulty walking.  The patient denies anorexia, fever, weight loss, weight gain, vision loss, decreased hearing, hoarseness, chest pain, syncope, dyspnea on exertion, peripheral edema, prolonged cough, headaches, hemoptysis, abdominal pain, melena,  hematochezia, severe indigestion/heartburn, hematuria, incontinence, genital sores, suspicious skin lesions, transient blindness, depression, unusual weight change, abnormal bleeding, enlarged lymph nodes, angioedema, and breast masses.    Physical Exam  General:  overweight-appearing.  122/78overweight-appearing.   Head:  Normocephalic and atraumatic without obvious abnormalities. No apparent alopecia or balding. Mouth:  Oral mucosa and oropharynx without lesions or exudates.  Teeth in good repair. Neck:  Aspen collar in place Lungs:  Normal respiratory effort, chest expands symmetrically. Lungs are clear to auscultation, no crackles or wheezes. Heart:  Normal rate and regular rhythm. S1 and S2 normal without gallop, murmur, click, rub or other extra sounds. Abdomen:  Bowel sounds positive,abdomen soft and non-tender without masses, organomegaly or hernias noted. Msk:  No deformity or scoliosis noted of thoracic or lumbar spine.   Extremities:  trace left pedal edema and trace right pedal edema.  trace left pedal edema and trace right pedal edema.     Impression & Recommendations:  Problem # 1:  PURE HYPERCHOLESTEROLEMIA (ICD-272.0)  Her updated medication list for this problem includes:    Simvastatin 40 Mg Tabs (Simvastatin) .[name]... 1 once daily  Her updated medication list for this problem includes:    Simvastatin 40 Mg Tabs (Simvastatin) .[name]... 1 once daily  Problem # 2:  DEGENERATIVE JOINT DISEASE (ICD-715.90)  Her updated medication list for this problem includes:    Celebrex 200 Mg Caps (Celecoxib) ..... One by mouth daily    Hydrocodone-acetaminophen 5-500 Mg Tabs (Hydrocodone-acetaminophen) ..... One  every 6 hs for pain    Bayer Aspirin 325 Mg Tabs (Aspirin) .[name]... 1 once daily  Her updated medication list for this problem includes:    Celebrex 200 Mg Caps (Celecoxib) ..... One by mouth daily    Hydrocodone-acetaminophen 5-500 Mg Tabs (Hydrocodone-acetaminophen) ..... One  every 6 hs for pain    Bayer Aspirin 325 Mg Tabs (Aspirin) .[name]... 1 once daily  Problem # 3:  HYPERTENSION (ICD-401.9)  Her updated medication list for this problem includes:    Benazepril Hcl 40 Mg Tabs (Benazepril hcl) ..... One by mouth daily    Metoprolol Tartrate 50 Mg Tabs (Metoprolol tartrate) .[name]... 1 two times a day    Spironolactone 25 Mg Tabs (Spironolactone) .[name]... 1 once daily  Her updated medication list for this problem includes:    Benazepril Hcl 40 Mg Tabs (Benazepril hcl) ..... One by mouth daily    Metoprolol Tartrate 50 Mg Tabs (Metoprolol tartrate) .[name]... 1 two times a day    Spironolactone 25 Mg Tabs (Spironolactone) .[name]... 1 once daily  Complete Medication List: 1)  Benazepril Hcl 40 Mg Tabs (Benazepril hcl) .... One by mouth daily 2)  Celebrex 200 Mg Caps (Celecoxib) .... One by mouth daily 3)  Lorazepam 0.5 Mg Tabs (Lorazepam) .[name].. 1 q8h as needed anxiety 4)  Premarin 0.625 Mg/gm Crea (Estrogens, conjugated) .... As needed 5)  Prilosec 20 Mg Cpdr (Omeprazole) .... One by mouth daily 6)  Neurontin 600 Mg Tabs (Gabapentin) .... One tid 7)  Hydrocodone-acetaminophen 5-500 Mg Tabs (Hydrocodone-acetaminophen) .... One every 6 hs for pain 8)  Trazodone Hcl 100 Mg Tabs (Trazodone hcl) .... 1/2 at bedtime 9)  Bayer Aspirin 325 Mg Tabs (Aspirin) .[name].. 1 once daily 10)  Metoprolol Tartrate 50 Mg Tabs (Metoprolol tartrate) .[name].. 1 two times a day 11)  Simvastatin 40 Mg Tabs (Simvastatin) .[name].. 1 once daily 12)  Nitrostat 0.4 Mg Subl (Nitroglycerin) .... Use as needed 13)  Spironolactone 25 Mg Tabs (Spironolactone) .[name].. 1 once daily  Patient Instructions: 1)  Please schedule a follow-up appointment in 6 months. 2)  Limit your Sodium (Salt) to less than 2 grams a day(slightly less than 1/2 a teaspoon) to prevent fluid retention, swelling, or worsening of symptoms. 3)  It is important that you exercise regularly at least 20 minutes 5 times a week. If you develop chest pain, have  severe difficulty breathing, or feel very tired , stop exercising immediately and seek medical attention. 4)  You need to lose weight. Consider a lower calorie diet and regular exercise.  Prescriptions: SPIRONOLACTONE 25 MG TABS (SPIRONOLACTONE) 1 once daily  #90 x 3   Entered and Authorized by:   Gordy Savers  MD   Signed by:   Gordy Savers  MD on 03/29/2009   Method used:   Print then Give to Patient   RxID:   [id number] SIMVASTATIN 40 MG TABS (SIMVASTATIN) 1 once daily  #90 x 6   Entered and Authorized by:   Gordy Savers  MD   Signed by:   Gordy Savers  MD on 03/29/2009   Method used:   Print then Give to Patient   RxID:   [id number] METOPROLOL TARTRATE 50 MG TABS (METOPROLOL TARTRATE) 1 two times a day  #180 x 6   Entered and Authorized by:   Gordy Savers  MD   Signed by:   Gordy Savers  MD on 03/29/2009   Method used:   Print then Give  to Patient   RxID:   [id number] TRAZODONE HCL 100 MG  TABS (TRAZODONE HCL) 1/2 at bedtime  #90 x 6   Entered and Authorized by:   Gordy Savers  MD   Signed by:   Gordy Savers  MD on 03/29/2009   Method used:   Print then Give to Patient   RxID:   [id number] NEURONTIN 600 MG  TABS (GABAPENTIN) one tid  #270 x 6   Entered and Authorized by:   Gordy Savers  MD   Signed by:   Gordy Savers  MD on 03/29/2009   Method used:   Print then Give to Patient   RxID:   [id number] HYDROCODONE-ACETAMINOPHEN 5-500 MG  TABS (HYDROCODONE-ACETAMINOPHEN) one every 6 hs for pain  #90 x 6   Entered and Authorized by:   Gordy Savers  MD   Signed by:   Gordy Savers  MD on 03/29/2009   Method used:   Print then Give to Patient   RxID:   [id number] PRILOSEC 20 MG  CPDR (OMEPRAZOLE) one by mouth daily  #90 x 6   Entered and Authorized by:   Gordy Savers  MD   Signed by:   Gordy Savers  MD on 03/29/2009   Method used:   Print then Give to  Patient   RxID:   [id number] LORAZEPAM 0.5 MG TABS (LORAZEPAM) 1 q8h as needed anxiety  #60 x 4   Entered and Authorized by:   Gordy Savers  MD   Signed by:   Gordy Savers  MD on 03/29/2009   Method used:   Print then Give to Patient   RxID:   [id number] CELEBREX 200 MG CAPS (CELECOXIB) one by mouth daily  #90 x 6   Entered and Authorized by:   Gordy Savers  MD   Signed by:   Gordy Savers  MD on 03/29/2009   Method used:   Print then Give to Patient   RxID:   [id number] BENAZEPRIL HCL 40 MG TABS (BENAZEPRIL HCL) one by mouth daily  #90 x 6   Entered and Authorized by:   Gordy Savers  MD   Signed by:   Gordy Savers  MD on 03/29/2009   Method used:   Print then Give to Patient   RxID:   [id number]

## 2010-08-16 NOTE — Letter (Signed)
Summary: Request for Surgical Clearance/[facility]  Request for Surgical Clearance/[facility]   Imported By: Maryln Gottron 12/30/2008 08:03:01  _____________________________________________________________________  External Attachment:    Type:   Image     Comment:   External Document

## 2010-08-16 NOTE — Progress Notes (Signed)
Summary: Dr Winfred Burn pt requesting RX cough/congestion  Phone Note Call from Patient Call back at [phone]   Caller: Patient Call For: Kwiat/Todd Summary of Call: Pt called to report she has had chest congestion X 3-4 days and feels she is getting worse instead of better.  She is coughing alot and her chest hurts, coughing up thick yellow musous, running a low grade fever (99.2)  Pt has been using OTC Mucinex, Nyquil and Delsym Cough Syrup with little results.  She is humidifying her bedroom as she is coughing alot at HS. Offered OV, pt unable to come at times available today. Pt requesting RX be called into [facility] on [address]  ([place]) Initial call taken by: Sid Falcon LPN,  July 14, 2007 10:28 AM  Follow-up for Phone Call        Hycodan cough syrup 8 ounces one to 2 teaspoons t.i.d. p.r.n. for cough and cold one refill Follow-up by: Roderick Pee MD,  July 14, 2007 10:56 AM  Additional Follow-up for Phone Call Additional follow up Details #1::        Rx called in, pt daughter informed.  Pt daughter wanted an antibiotic.  Scheduled OV with Dr Clent Ridges tomorrow.  Pt daughter will pick-up Hycodan and if mother sx calm down, she will call and cancel 9:45 OV tomorrow. Additional Follow-up by: Sid Falcon LPN,  July 14, 2007 11:24 AM    New/Updated Medications: HYCODAN 5-1.5 MG/5ML  SYRP (HYDROCODONE-HOMATROPINE) one to two tsp three times a day as needed for cough/cold sx   Prescriptions: HYCODAN 5-1.5 MG/5ML  SYRP (HYDROCODONE-HOMATROPINE) one to two tsp three times a day as needed for cough/cold sx  #8oz x 1   Entered by:   Sid Falcon LPN   Authorized by:   Roderick Pee MD   Signed by:   Alfred Levins, CMA on 07/14/2007   Method used:   Telephoned to ...       [address]       [address]       [place], [place]  [address]       Ph: [phone]       Fax: [phone]   RxID:   [id number]

## 2010-08-16 NOTE — Progress Notes (Signed)
Summary: REQ FOR RESULTS  Phone Note Call from Patient   Caller: Patient    [phone] Summary of Call: Pt called to obtain results of recent MRI.... Pt can be reached at [phone].  Initial call taken by: Debbra Riding,  March 02, 2010 1:45 PM  Follow-up for Phone Call        called Follow-up by: Gordy Savers  MD,  March 02, 2010 5:43 PM

## 2010-08-16 NOTE — Assessment & Plan Note (Signed)
Summary: weakness in arms/dm/pts grand daughter rsc/cjr   Vital Signs:  Patient profile:   77 year old female Weight:      200 pounds Temp:     97.4 degrees F oral BP sitting:   126 / 80  (right arm) Cuff size:   regular  Vitals Entered By: Duard Brady LPN (February 13, 2010 10:28 AM) CC: c/o weakness and 'cant control movement in arms ' -   Primary Care Provider:  Gordy Savers  MD  CC:  c/o weakness and 'cant control movement in arms ' -.  History of Present Illness: 77 year old patient who underwent a cervical fusion in June of last year due to multi-level, osteoarthritis, and chronic pain.  She states the surgery has not been very helpful for neck pain.  For the past two or 3 weeks.  She states that she has had difficulties with use of her upper extremities.  She states that she can control the movement in her arms and often drops things like her cup of coffee. She does have a long history of urinary incontinence, but this is fairly infrequent and has not changed.  Release 8 months.  She also feels that her gait has worsened slightly, but has been severely handicapped due to arthritis  Allergies (verified): No Known Drug Allergies  Past History:  Past Medical History: Reviewed history from 02/07/2010 and no changes required. Hypertension Advanced DJD Colonic polyps, hx of Diverticulosis, colon GERD w/esophageal strictures Hiatal hernia Coronary artery disease      -s/p BMS RCA 4/09 Diastolic HF  Depression  Past Surgical History: Reviewed history from 03/29/2009 and no changes required. Appendectomy Cholecystectomy Hysterectomy Lumbar laminectomy Total knee replacement-bilateral; status post left total knee  replacement x 3 Rotator cuff repair status post cardiac stents, April 2009 s/p Cervical fusion 01-2009 Otelia Sergeant)  Review of Systems       The patient complains of muscle weakness, difficulty walking, and depression.  The patient denies anorexia,  fever, weight loss, weight gain, vision loss, decreased hearing, hoarseness, chest pain, syncope, dyspnea on exertion, peripheral edema, prolonged cough, headaches, hemoptysis, abdominal pain, melena, hematochezia, severe indigestion/heartburn, hematuria, incontinence, genital sores, suspicious skin lesions, transient blindness, unusual weight change, abnormal bleeding, enlarged lymph nodes, angioedema, and breast masses.    Physical Exam  General:  Well-developed,well-nourished,in no acute distress; alert,appropriate and cooperative throughout examination; overweight normal blood pressure Head:  Normocephalic and atraumatic without obvious abnormalities. No apparent alopecia or balding. Eyes:  No corneal or conjunctival inflammation noted. EOMI. Perrla. Funduscopic exam benign, without hemorrhages, exudates or papilledema. Vision grossly normal. Mouth:  Oral mucosa and oropharynx without lesions or exudates.  Teeth in good repair. Neck:  No deformities, masses, or tenderness noted. Lungs:  Normal respiratory effort, chest expands symmetrically. Lungs are clear to auscultation, no crackles or wheezes. Neurologic:  mild symmetrical decrease in grip strength no drift of the outstretched arms gait is slow and deliberate, but not grossly ataxic reflexes are not hyperreflexicalert & oriented X3, cranial nerves II-XII intact, and finger-to-nose normal.  rapid alternating movements, slow and deliberate, but not grossly dystaxicalert & oriented X3, cranial nerves II-XII intact, and finger-to-nose normal.     Impression & Recommendations:  Problem # 1:  NECK PAIN, CHRONIC (ICD-723.1)  Her updated medication list for this problem includes:    Celebrex 200 Mg Caps (Celecoxib) ..... One by mouth daily    Hydrocodone-acetaminophen 5-500 Mg Tabs (Hydrocodone-acetaminophen) ..... One every 6 hs for pain    Bayer  Aspirin 325 Mg Tabs (Aspirin) .Marland Kitchen... 1 once daily symptoms are bothersome for a cervical  myelopathy but very little clinically to suggest major cord compression; will proceed with cervical MRI.  Stangl benefit from physical therapy with gait training    Her updated medication list for this problem includes:    Celebrex 200 Mg Caps (Celecoxib) ..... One by mouth daily    Hydrocodone-acetaminophen 5-500 Mg Tabs (Hydrocodone-acetaminophen) ..... One every 6 hs for pain    Bayer Aspirin 325 Mg Tabs (Aspirin) .Marland Kitchen... 1 once daily  Orders: Radiology Referral (Radiology)  Problem # 2:  DEGENERATIVE JOINT DISEASE (ICD-715.90)  Her updated medication list for this problem includes:    Celebrex 200 Mg Caps (Celecoxib) ..... One by mouth daily    Hydrocodone-acetaminophen 5-500 Mg Tabs (Hydrocodone-acetaminophen) ..... One every 6 hs for pain    Bayer Aspirin 325 Mg Tabs (Aspirin) .Marland Kitchen... 1 once daily  Her updated medication list for this problem includes:    Celebrex 200 Mg Caps (Celecoxib) ..... One by mouth daily    Hydrocodone-acetaminophen 5-500 Mg Tabs (Hydrocodone-acetaminophen) ..... One every 6 hs for pain    Bayer Aspirin 325 Mg Tabs (Aspirin) .Marland Kitchen... 1 once daily  Problem # 3:  DEPRESSION (ICD-311)  Her updated medication list for this problem includes:    Lorazepam 0.5 Mg Tabs (Lorazepam) .Marland Kitchen... 1 q8h as needed anxiety    Trazodone Hcl 100 Mg Tabs (Trazodone hcl) .Marland Kitchen... 1/2 at bedtime    Sertraline Hcl 50 Mg Tabs (Sertraline hcl) ..... One every am  Her updated medication list for this problem includes:    Lorazepam 0.5 Mg Tabs (Lorazepam) .Marland Kitchen... 1 q8h as needed anxiety    Trazodone Hcl 100 Mg Tabs (Trazodone hcl) .Marland Kitchen... 1/2 at bedtime    Sertraline Hcl 50 Mg Tabs (Sertraline hcl) ..... One every am  Complete Medication List: 1)  Benazepril Hcl 40 Mg Tabs (Benazepril hcl) .... One by mouth daily 2)  Celebrex 200 Mg Caps (Celecoxib) .... One by mouth daily 3)  Lorazepam 0.5 Mg Tabs (Lorazepam) .Marland Kitchen.. 1 q8h as needed anxiety 4)  Premarin 0.625 Mg/gm Crea (Estrogens, conjugated)  .... As needed 5)  Prilosec 20 Mg Cpdr (Omeprazole) .... One by mouth daily 6)  Neurontin 600 Mg Tabs (Gabapentin) .... One tid 7)  Hydrocodone-acetaminophen 5-500 Mg Tabs (Hydrocodone-acetaminophen) .... One every 6 hs for pain 8)  Trazodone Hcl 100 Mg Tabs (Trazodone hcl) .... 1/2 at bedtime 9)  Bayer Aspirin 325 Mg Tabs (Aspirin) .Marland Kitchen.. 1 once daily 10)  Metoprolol Tartrate 50 Mg Tabs (Metoprolol tartrate) .Marland Kitchen.. 1 two times a day 11)  Simvastatin 40 Mg Tabs (Simvastatin) .Marland Kitchen.. 1 once daily 12)  Nitrostat 0.4 Mg Subl (Nitroglycerin) .... Use as needed 13)  Spironolactone 25 Mg Tabs (Spironolactone) .Marland Kitchen.. 1 once daily 14)  Ciprofloxacin Hcl 500 Mg Tabs (Ciprofloxacin hcl) .... One twice daily for 10 days 15)  Sertraline Hcl 50 Mg Tabs (Sertraline hcl) .... One every am  Patient Instructions: 1)  cervical MRI as scheduled 2)  call if any symptoms worsen

## 2010-08-16 NOTE — Assessment & Plan Note (Signed)
Summary: 1 MONTH ROA/JLS   Vital Signs:  Patient Profile:   77 Years Old Female Weight:      198 pounds Temp:     98.2 degrees F oral BP sitting:   140 / 70  (left arm)  Vitals Entered By: Raechel Ache, RN (July 31, 2007 10:31 AM)                 Chief Complaint:  1 mo ROV. Was sick 12/31 and went to Urgent Care; better now. Still has pain R side head and leg cramps..  History of Present Illness: a 77 year old female seen today for follow-up.  She is advanced generalized osteoarthritis.  Main complaint today is a right-sided headache.  This has been present for a few months.  No further abdominal pain.  She has history of diverticulosis and had moderately severe anemia in November.  Her last colonoscopy was in 2004.  She is also seen at the urgent care recently for what sounds like viral gastroenteritis.  The symptoms have resolved. if       Physical Exam  General:     overweight-appearing.  blood pressure well-controlled Head:     Normocephalic and atraumatic without obvious abnormalities.there is some mild tenderness involving the right parietal, temporal scalp Eyes:     No corneal or conjunctival inflammation noted. EOMI. Perrla. Funduscopic exam benign, without hemorrhages, exudates or papilledema. Vision grossly normal. Ears:     External ear exam shows no significant lesions or deformities.  Otoscopic examination reveals clear canals, tympanic membranes are intact bilaterally without bulging, retraction, inflammation or discharge. Hearing is grossly normal bilaterally. Mouth:     Oral mucosa and oropharynx without lesions or exudates.  Teeth in good repair. Neck:     No deformities, masses, or tenderness noted. Lungs:     Normal respiratory effort, chest expands symmetrically. Lungs are clear to auscultation, no crackles or wheezes. Heart:     Normal rate and regular rhythm. S1 and S2 normal without gallop, murmur, click, rub or other extra sounds. Abdomen:     obese soft and nontender    Impression & Recommendations:  Problem # 1:  ANEMIA (ICD-285.9)  Her updated medication list for this problem includes:    Nu-iron 150 Mg Caps (Polysaccharide iron complex) .Marland Kitchen... 1 once daily  Orders: Venipuncture (40347) TLB-CBC Platelet - w/Differential (85025-CBCD)   Problem # 2:  DEGENERATIVE JOINT DISEASE (ICD-715.90)  Her updated medication list for this problem includes:    Celebrex 200 Mg Caps (Celecoxib) ..... One by mouth daily    Hydrocodone-acetaminophen 5-500 Mg Tabs (Hydrocodone-acetaminophen) ..... One every 6 hs for pain   Problem # 3:  HEADACHE (ICD-784.0)  Her updated medication list for this problem includes:    Celebrex 200 Mg Caps (Celecoxib) ..... One by mouth daily    Hydrocodone-acetaminophen 5-500 Mg Tabs (Hydrocodone-acetaminophen) ..... One every 6 hs for pain I wonder if this is related to cervical DJD.  It does respond to her analgesics Orders: Sedimentation Rate, non-automated (42595)   Complete Medication List: 1)  Benazepril Hcl 40 Mg Tabs (Benazepril hcl) .... One by mouth daily 2)  Celebrex 200 Mg Caps (Celecoxib) .... One by mouth daily 3)  Lorazepam 0.5 Mg Tabs (Lorazepam) 4)  Premarin 0.625 Mg/gm Crea (Estrogens, conjugated) .... Use vaginally three times daily for 2 weeks, then two times weekly 5)  Hydrochlorothiazide 25 Mg Tabs (Hydrochlorothiazide) .... One by mouth daily 6)  Prilosec 20 Mg Cpdr (Omeprazole) .... One by  mouth daily 7)  Klor-con M20 20 Meq Tbcr (Potassium chloride crys cr) .... One by mouth daily 8)  Neurontin 600 Mg Tabs (Gabapentin) .... One tid 9)  Metoclopramide Hcl 10 Mg Tabs (Metoclopramide hcl) .... One daily before evening meal 10)  Hydrocodone-acetaminophen 5-500 Mg Tabs (Hydrocodone-acetaminophen) .... One every 6 hs for pain 11)  Trazodone Hcl 100 Mg Tabs (Trazodone hcl) .... 1/2 at bedtime 12)  Nu-iron 150 Mg Caps (Polysaccharide iron complex) .Marland Kitchen.. 1 once daily 13)  Hycodan  5-1.5 Mg/62ml Syrp (Hydrocodone-homatropine) .... One to two tsp three times a day as needed for cough/cold sx   Patient Instructions: 1)  Please schedule a follow-up appointment in 3 months. 2)  Limit your Sodium (Salt) to less than 2 grams a day(slightly less than 1/2 a teaspoon) to prevent fluid retention, swelling, or worsening of symptoms. 3)  It is important that you exercise regularly at least 20 minutes 5 times a week. If you develop chest pain, have severe difficulty breathing, or feel very tired , stop exercising immediately and seek medical attention. 4)  You need to lose weight. Consider a lower calorie diet and regular exercise.  5)  Take calcium +Vitamin D daily.    ]  Appended Document: Orders Update    Clinical Lists Changes  Observations: Added new observation of COMMENTS2: ..................................................................Marland KitchenWynona Canes, CMA  July 31, 2007 1:14 PM  (07/31/2007 13:13)       Laboratory Results   Blood Tests    Date/Time Reported: July 31, 2007 1:14 PM   SED rate: 30  Comments: ..................................................................Marland KitchenWynona Canes, CMA  July 31, 2007 1:14 PM

## 2010-08-16 NOTE — Assessment & Plan Note (Signed)
Summary: 2 wk f/up//db   Vital Signs:  Patient Profile:   77 Years Old Female Weight:      200 pounds Temp:     98.2 degrees F oral BP sitting:   148 / 78  (left arm)  Vitals Entered By: Raechel Ache, RN (June 30, 2007 10:54 AM)                 Chief Complaint:  F/u; still has RLQ pain and worse with movement. Finished abx..  History of Present Illness: 77 year old female seen today for follow up of her abdominal pain and anemia.  She was intolerant of the iron supplement and is no longer taking.  She took this just for one week.  She complains of right greater the left lower abdominal pain.  She remains on Celebrex as well as a PPI        Physical Exam  General:     overweight-appearing.   Head:     Normocephalic and atraumatic without obvious abnormalities. No apparent alopecia or balding. Eyes:     No corneal or conjunctival inflammation noted. EOMI. Perrla. Funduscopic exam benign, without hemorrhages, exudates or papilledema. Vision grossly normal. Mouth:     Oral mucosa and oropharynx without lesions or exudates.  Teeth in good repair. Neck:     No deformities, masses, or tenderness noted. Lungs:     Normal respiratory effort, chest expands symmetrically. Lungs are clear to auscultation, no crackles or wheezes. Heart:     Normal rate and regular rhythm. S1 and S2 normal without gallop, murmur, click, rub or other extra sounds. Abdomen:     of the soft chest mild centers.  The right lower quadrant.  No guarding or rebound.  Bowel sounds were normal    Impression & Recommendations:  Problem # 1:  DIVERTICULOSIS, COLON (ICD-562.10)  Problem # 2:  GERD (ICD-530.81)  Her updated medication list for this problem includes:    Prilosec 20 Mg Cpdr (Omeprazole) ..... One by mouth daily   Complete Medication List: 1)  Benazepril Hcl 40 Mg Tabs (Benazepril hcl) .... One by mouth daily 2)  Celebrex 200 Mg Caps (Celecoxib) .... One by mouth daily 3)   Lorazepam 0.5 Mg Tabs (Lorazepam) 4)  Premarin 0.625 Mg/gm Crea (Estrogens, conjugated) .... Use vaginally three times daily for 2 weeks, then two times weekly 5)  Hydrochlorothiazide 25 Mg Tabs (Hydrochlorothiazide) .... One by mouth daily 6)  Prilosec 20 Mg Cpdr (Omeprazole) .... One by mouth daily 7)  Klor-con M20 20 Meq Tbcr (Potassium chloride crys cr) .... One by mouth daily 8)  Neurontin 600 Mg Tabs (Gabapentin) .... One tid 9)  Metoclopramide Hcl 10 Mg Tabs (Metoclopramide hcl) .... One daily before evening meal 10)  Hydrocodone-acetaminophen 5-500 Mg Tabs (Hydrocodone-acetaminophen) .... One every 6 hs for pain 11)  Trazodone Hcl 100 Mg Tabs (Trazodone hcl) .... 1/2 at bedtime  Other Orders: Venipuncture (04540) TLB-CBC Platelet - w/Differential (85025-CBCD) TLB-Ferritin (82728-FER) TLB-Sedimentation Rate (ESR) (85651-ESR)   Patient Instructions: 1)  Please schedule a follow-up appointment in 1 month. 2)  Limit your Sodium (Salt) to less than 2 grams a day(slightly less than 1/2 a teaspoon) to prevent fluid retention, swelling, or worsening of symptoms. 3)  Avoid foods high in acid (tomatoes, citrus juices, spicy foods). Avoid eating within two hours of lying down or before exercising. Do not over eat; try smaller more frequent meals. Elevate head of bed twelve inches when sleeping. 4)  high  fiber diet    ]  Appended Document: 2 wk f/up//db   Appended Document: Orders Update    Clinical Lists Changes  Observations: Added new observation of COMMENTS2: ..................................................................Marland KitchenWynona Canes, CMA  June 30, 2007 2:55 PM  (06/30/2007 14:54)       Laboratory Results   Blood Tests    Date/Time Reported: June 30, 2007 2:55 PM   SED rate: 32  Comments: ..................................................................Marland KitchenWynona Canes, CMA  June 30, 2007 2:55 PM

## 2010-08-16 NOTE — Assessment & Plan Note (Signed)
Summary: PER CHECK OUT/SF   Visit Type:  Follow-up Primary Provider:  Gordy Savers  MD  CC:  pt states she is not feeling well today.  She has been coughing and wheezing for two days.  Marland Kitchen  History of Present Illness: [name] is a delightful 77 year old woman with a history of hypertension, obesity, diastolic heart failure, hyperlipidemia, severe osteo-arthirits s/p TKA x 4 (L x , R x 1) and coronary artery disease s/p BMS to RCA in 4/09.  LV function is normal.  She returns today for follow-up. At last visit in [place] 2011. Continued to have orthopnea. ECHO obtained. EF normal grade I diastolic HF. Moderate MR. Mild TR with normal pulm pressures. BNP midly elevated at 213. CXR: no CHF.   Continues to have severe problems with back and leg pain. Continues to have 3-pillow orthopnea. Unchanged. No PND. Having problems sleeping. No edema in am by end of day has mild edema. Occasional CP, mild. Having problems with cough.  No fevers or chills recently. + daytime sweats.   Current Medications (verified): 1)  Benazepril Hcl 40 Mg Tabs (Benazepril Hcl) .... One By Mouth Daily 2)  Celebrex 200 Mg Caps (Celecoxib) .... One By Mouth Daily 3)  Lorazepam 0.5 Mg Tabs (Lorazepam) .Marland Kitchen.. 1 Q8h As Needed Anxiety 4)  Premarin 0.625 Mg/gm  Crea (Estrogens, Conjugated) .... As Needed 5)  Prilosec 20 Mg  Cpdr (Omeprazole) .... One By Mouth Daily 6)  Neurontin 600 Mg  Tabs (Gabapentin) .... One Tid 7)  Hydrocodone-Acetaminophen 5-500 Mg  Tabs (Hydrocodone-Acetaminophen) .... One Every 6 Hs For Pain 8)  Trazodone Hcl 100 Mg  Tabs (Trazodone Hcl) .... 1/2 At Bedtime 9)  Bayer Aspirin 325 Mg Tabs (Aspirin) .Marland Kitchen.. 1 Once Daily 10)  Metoprolol Tartrate 50 Mg Tabs (Metoprolol Tartrate) .Marland Kitchen.. 1 Two Times A Day 11)  Simvastatin 40 Mg Tabs (Simvastatin) .Marland Kitchen.. 1 Once Daily 12)  Nitrostat 0.4 Mg Subl (Nitroglycerin) .... Use As Needed 13)  Spironolactone 25 Mg Tabs (Spironolactone) .Marland Kitchen.. 1 Once Daily 14)  Sertraline Hcl 50  Mg Tabs (Sertraline Hcl) .... One Every Am/  Pt Not Taking Every Day As Directed  Allergies (verified): No Known Drug Allergies  Past History:  Past Medical History: Last updated: 02/07/2010 Hypertension Advanced DJD Colonic polyps, hx of Diverticulosis, colon GERD w/esophageal strictures Hiatal hernia Coronary artery disease      -s/p BMS RCA 4/09 Diastolic HF  Depression  Review of Systems       As per HPI and past medical history; otherwise all systems negative.   Vital Signs:  Patient profile:   77 year old female Height:      63 inches Weight:      200 pounds BMI:     35.56 Pulse rate:   64 / minute Pulse rhythm:   regular BP sitting:   142 / 74  (left arm) Cuff size:   large  Vitals Entered By: Judithe Modest CMA (March 14, 2010 11:38 AM)  Physical Exam  General:  elderly fatigue appearing. no resp difficulty. persistent dry cough HEENT: normal Neck: supple. no JVD. Carotids 2+ bilat; no bruits. No lymphadenopathy or thryomegaly appreciated. Cor: PMI nondisplaced. Regular rate & rhythm. No rubs,  murmur. +s4 Lungs: bronchial breath sounds mild upper airway wheeze Abdomen: obese soft, nontender, nondistended.  Good bowel sounds. Extremities: no cyanosis, clubbing, rash, trace edema L > R DPs 2+ bilaterally no ucleration Neuro: alert & orientedx3, cranial nerves grossly intact. moves all 4 extremities  w/o difficulty. affect pleasant    Impression & Recommendations:  Problem # 1:  DIASTOLIC HEART FAILURE, CHRONIC (ICD-428.32) Volume status looks good. Will prescribe lasix 20 to use only on as needed basis. I do not think HF is a major factor in her symptoms. Will refer her to pulmonary for PFTs and to see if she might benefit from an inhaler for chronic brinchitis.   Problem # 2:  DYSPNEA (ICD-786.05) As above.   Problem # 3:  COUGH (ICD-786.2) Will check CXR today. I have asked her to f/u with Dr. Amador Cunas if symptoms not improving.   Problem #  4:  Mitrla regurgitation In mild to moderate range. Will follow.   Other Orders: T-2 View CXR (71020TC) Pulmonary Referral (Pulmonary)  Patient Instructions: 1)  Start Furosemide 20mg  daily as needed  2)  A chest x-ray takes a picture of the organs and structures inside the chest, including the heart, lungs, and blood vessels. This test can show several things, including, whether the heart is enlarged; whether fluid is building up in the lungs; and whether pacemaker / defibrillator leads are still in place. 3)  You have been referred to Pulmonary 4)  Your physician wants you to follow-up in:  9 months. You will receive a reminder letter in the mail two months in advance. If you don't receive a letter, please call our office to schedule the follow-up appointment. Prescriptions: FUROSEMIDE 20 MG TABS (FUROSEMIDE) Take one tablet by mouth daily as needed  #30 x 6   Entered by:   Meredith Staggers, RN   Authorized by:   Dolores Patty, MD, [facility]   Signed by:   Meredith Staggers, RN on 03/14/2010   Method used:   Electronically to        [facility] [phone]* (retail)       [address]       [place], [place]  [address]       Ph: [phone]       Fax: [phone]   RxID:   [id number]

## 2010-08-16 NOTE — Assessment & Plan Note (Signed)
Summary: CONSULT RE: COUGH SINCE SEPTEMBER/CJR   Vital Signs:  Patient profile:   77 year old female Weight:      198 pounds Temp:     98.0 degrees F oral BP sitting:   114 / 70  (right arm) Cuff size:   regular  Vitals Entered By: Duard Brady LPN (April 26, 2010 1:42 PM) CC: c/o cough for over 1 month - not going away , keeping awake Is Patient Diabetic? No   Primary Care Provider:  Gordy Savers  MD  CC:  c/o cough for over 1 month - not going away  and keeping awake.  History of Present Illness: 77 year old patient who is seen today with a chief complaint of persistent refractory cough.  She was seen here about 5 weeks ago with a two week history of a nonproductive cough.  Nine days prior to her last visit.  A chest x-ray was negative.  The patient's cough has worsened and has become productive of yellow to green sputum.  She has developed much more chest congestion and intermittent wheezing.  She has been to symptomatically with anti-tussis without benefit.  She does have a history of diastolic heart failure, hypertension, and coronary artery disease.  Denies any fever or chills  Allergies (verified): No Known Drug Allergies  Past History:  Past Medical History: Reviewed history from 02/07/2010 and no changes required. Hypertension Advanced DJD Colonic polyps, hx of Diverticulosis, colon GERD w/esophageal strictures Hiatal hernia Coronary artery disease      -s/p BMS RCA 4/09 Diastolic HF  Depression  Past Surgical History: Reviewed history from 03/29/2009 and no changes required. Appendectomy Cholecystectomy Hysterectomy Lumbar laminectomy Total knee replacement-bilateral; status post left total knee  replacement x 3 Rotator cuff repair status post cardiac stents, April 2009 s/p Cervical fusion 01-2009 Otelia Sergeant)  Review of Systems       The patient complains of anorexia, dyspnea on exertion, prolonged cough, and difficulty walking.  The patient  denies fever, weight loss, weight gain, vision loss, decreased hearing, hoarseness, chest pain, syncope, peripheral edema, headaches, hemoptysis, abdominal pain, melena, hematochezia, severe indigestion/heartburn, hematuria, incontinence, genital sores, muscle weakness, suspicious skin lesions, transient blindness, depression, unusual weight change, abnormal bleeding, enlarged lymph nodes, angioedema, and breast masses.    Physical Exam  General:  overweight-appearing.  no distress at rest, but frequent paroxysms of coughing.  Blood pressure 130/80overweight-appearing.   Head:  Normocephalic and atraumatic without obvious abnormalities. No apparent alopecia or balding. Eyes:  No corneal or conjunctival inflammation noted. EOMI. Perrla. Funduscopic exam benign, without hemorrhages, exudates or papilledema. Vision grossly normal. Mouth:  Oral mucosa and oropharynx without lesions or exudates.  Teeth in good repair. Neck:  No deformities, masses, or tenderness noted. Lungs:  coarse rhonchi bilaterally, right greater than the leftnormal respiratory effort, no intercostal retractions, and no accessory muscle use.   O2 saturation 96%normal respiratory effort, no intercostal retractions, and no accessory muscle use.   Heart:  Normal rate and regular rhythm. S1 and S2 normal without gallop, murmur, click, rub or other extra sounds.  no tachycardia and pulse rate 64 Extremities:  no edema   Impression & Recommendations:  Problem # 1:  BRONCHITIS-ACUTE (ICD-466.0)  Her updated medication list for this problem includes:    Hydrocodone-homatropine 5-1.5 Mg/72ml Syrp (Hydrocodone-homatropine) .Marland Kitchen... 1 teaspoon every 6 hours as needed for cough or congestion    Azithromycin 250 Mg Tabs (Azithromycin) .Marland Kitchen..Marland Kitchen Two initially, then one daily for 4 additional days  Her updated medication  list for this problem includes:    Hydrocodone-homatropine 5-1.5 Mg/27ml Syrp (Hydrocodone-homatropine) .Marland Kitchen... 1 teaspoon every 6  hours as needed for cough or congestion    Azithromycin 250 Mg Tabs (Azithromycin) .Marland Kitchen..Marland Kitchen Two initially, then one daily for 4 additional days  Problem # 2:  COUGH (ICD-786.2) will substitute Benicar for 4 weeks  Problem # 3:  HYPERTENSION (ICD-401.9)  Her updated medication list for this problem includes:    Benazepril Hcl 40 Mg Tabs (Benazepril hcl) ..... One by mouth daily    Metoprolol Tartrate 50 Mg Tabs (Metoprolol tartrate) .Marland Kitchen... 1 two times a day    Spironolactone 25 Mg Tabs (Spironolactone) .Marland Kitchen... 1 once daily    Furosemide 20 Mg Tabs (Furosemide) .Marland Kitchen... Take one tablet by mouth daily as needed  Her updated medication list for this problem includes:    Benazepril Hcl 40 Mg Tabs (Benazepril hcl) ..... One by mouth daily    Metoprolol Tartrate 50 Mg Tabs (Metoprolol tartrate) .Marland Kitchen... 1 two times a day    Spironolactone 25 Mg Tabs (Spironolactone) .Marland Kitchen... 1 once daily    Furosemide 20 Mg Tabs (Furosemide) .Marland Kitchen... Take one tablet by mouth daily as needed  Complete Medication List: 1)  Benazepril Hcl 40 Mg Tabs (Benazepril hcl) .... One by mouth daily 2)  Celebrex 200 Mg Caps (Celecoxib) .... One by mouth daily 3)  Lorazepam 0.5 Mg Tabs (Lorazepam) .Marland Kitchen.. 1 q8h as needed anxiety 4)  Premarin 0.625 Mg/gm Crea (Estrogens, conjugated) .... As needed 5)  Prilosec 20 Mg Cpdr (Omeprazole) .... One by mouth daily 6)  Neurontin 600 Mg Tabs (Gabapentin) .... One tid 7)  Hydrocodone-acetaminophen 5-500 Mg Tabs (Hydrocodone-acetaminophen) .... One every 6 hs for pain 8)  Trazodone Hcl 100 Mg Tabs (Trazodone hcl) .... 1/2 at bedtime 9)  Bayer Aspirin 325 Mg Tabs (Aspirin) .Marland Kitchen.. 1 once daily 10)  Metoprolol Tartrate 50 Mg Tabs (Metoprolol tartrate) .Marland Kitchen.. 1 two times a day 11)  Simvastatin 40 Mg Tabs (Simvastatin) .Marland Kitchen.. 1 once daily 12)  Nitrostat 0.4 Mg Subl (Nitroglycerin) .... Use as needed 13)  Spironolactone 25 Mg Tabs (Spironolactone) .Marland Kitchen.. 1 once daily 14)  Sertraline Hcl 50 Mg Tabs (Sertraline hcl)  .... One every am/  pt not taking every day as directed 15)  Furosemide 20 Mg Tabs (Furosemide) .... Take one tablet by mouth daily as needed 16)  Hydrocodone-homatropine 5-1.5 Mg/55ml Syrp (Hydrocodone-homatropine) .Marland Kitchen.. 1 teaspoon every 6 hours as needed for cough or congestion 17)  Azithromycin 250 Mg Tabs (Azithromycin) .... Two initially, then one daily for 4 additional days  Other Orders: Flu Vaccine 76yrs + (01027) Admin 1st Vaccine (25366)  Patient Instructions: 1)  Take your antibiotic as prescribed until ALL of it is gone, but stop if you develop a rash or swelling and contact our office as soon as possible. 2)  mucinex DM  use twice daily  3)  Use binicar for 4 weeks  Prescriptions: CELEBREX 200 MG CAPS (CELECOXIB) one by mouth daily  #90 x 6   Entered and Authorized by:   Gordy Savers  MD   Signed by:   Gordy Savers  MD on 04/26/2010   Method used:   Electronically to        [facility] [phone]* (retail)       [address]       [place], [place]  [address]       Ph: [phone]       Fax: [phone]   RxID:  [id number] AZITHROMYCIN 250 MG TABS (AZITHROMYCIN) two initially, then one daily for 4 additional days  #6 tabs x 0   Entered and Authorized by:   Gordy Savers  MD   Signed by:   Gordy Savers  MD on 04/26/2010   Method used:   Electronically to        [facility] [phone]* (retail)       [address]       [place], [place]  [address]       Ph: [phone]       Fax: [phone]   RxID:   [id number] AZITHROMYCIN 250 MG TABS (AZITHROMYCIN) two initially, then one daily for 4 additional days  #6 tabs x 0   Entered and Authorized by:   Gordy Savers  MD   Signed by:   Gordy Savers  MD on 04/26/2010   Method used:   Electronically to        [facility]  [address] [phone]* (retail)       [address]       [place], [place]  [address]       Ph: [phone]       Fax: [phone]    RxID:   [id number]    Immunizations Administered:  Influenza Vaccine # 1:    Vaccine Type: Fluvax 3+    Site: left deltoid    Mfr: GlaxoSmithK[employer]    Dose: 0.5 ml    Route: IM    Given by: Duard Brady LPN    Exp. Date: 01/12/2011    Lot #: [id number]    VIS given: 02/06/10 version given April 26, 2010.    Physician counseled: yes  Flu Vaccine Consent Questions:    Do you have a history of severe allergic reactions to this vaccine? no    Any prior history of allergic reactions to egg and/or gelatin? no    Do you have a sensitivity to the preservative Thimersol? no    Do you have a past history of Guillan-Barre Syndrome? no    Do you currently have an acute febrile illness? no    Have you ever had a severe reaction to latex? no    Vaccine information given and explained to patient? yes    Are you currently pregnant? no

## 2010-08-16 NOTE — Assessment & Plan Note (Signed)
Summary: cough/chest congestion/?uti/cjr   Vital Signs:  Patient profile:   77 year old female Weight:      200 pounds Temp:     98.2 degrees F BP sitting:   148 / 82  (left arm) Cuff size:   regular  Vitals Entered By: Raechel Ache, RN (August 09, 2009 11:37 AM) CC: C/o productive cough and dysuria- sick since [place].   Primary Care Provider:  Gordy Savers  MD  CC:  C/o productive cough and dysuria- sick since christmas..  History of Present Illness: 77 year old patient who presents with a 4 week history of chest  congestion and productive cough.  Denies any fever or shortness of breath or chest pain.  Also gives a history of dysuria, frequency, and occasional incontinence.  Urinalysis was reviewed today that did reveal pyuria.  Denies any fever or chills.  She has a history of hypertension, gastroesophageal reflux disease, and osteoarthritis.  She also has a history of diastolic heart failure, which has been stable.  She has coronary artery disease.  She denies any exertional chest pain.  Allergies: No Known Drug Allergies  Past History:  Past Medical History: Reviewed history from 02/27/2009 and no changes required. Hypertension Advanced DJD Colonic polyps, hx of Diverticulosis, colon GERD w/esophageal strictures Hiatal hernia Coronary artery disease      -s/p BMS RCA 4/09 Diastolic HF  Past Surgical History: Reviewed history from 03/29/2009 and no changes required. Appendectomy Cholecystectomy Hysterectomy Lumbar laminectomy Total knee replacement-bilateral; status post left total knee  replacement x 3 Rotator cuff repair status post cardiac stents, April 2009 s/p Cervical fusion 01-2009 Otelia Sergeant)  Family History: Reviewed history from 04/05/2008 and no changes required. father died age 55 and her heart condition mother died age 4, heart failure, and significant osteoarthritis  Four brothers three half brothers Posicor R. disease with renal  failure three half sisters  Review of Systems       The patient complains of prolonged cough.  The patient denies anorexia, fever, weight loss, weight gain, vision loss, decreased hearing, hoarseness, chest pain, syncope, dyspnea on exertion, peripheral edema, headaches, hemoptysis, abdominal pain, melena, hematochezia, severe indigestion/heartburn, hematuria, incontinence, genital sores, muscle weakness, suspicious skin lesions, transient blindness, difficulty walking, depression, unusual weight change, abnormal bleeding, enlarged lymph nodes, angioedema, and breast masses.    Physical Exam  General:  alert, well-developed, well-nourished, and overweight-appearing.  normal blood pressure Head:  Normocephalic and atraumatic without obvious abnormalities. No apparent alopecia or balding. Eyes:  No corneal or conjunctival inflammation noted. EOMI. Perrla. Funduscopic exam benign, without hemorrhages, exudates or papilledema. Vision grossly normal. Ears:  External ear exam shows no significant lesions or deformities.  Otoscopic examination reveals clear canals, tympanic membranes are intact bilaterally without bulging, retraction, inflammation or discharge. Hearing is grossly normal bilaterally. Mouth:  Oral mucosa and oropharynx without lesions or exudates.  Teeth in good repair. Neck:  No deformities, masses, or tenderness noted. Lungs:  Normal respiratory effort, chest expands symmetrically. Lungs are clear to auscultation, no crackles or wheezes. Heart:  Normal rate and regular rhythm. S1 and S2 normal without gallop, murmur, click, rub or other extra sounds. Abdomen:  Bowel sounds positive,abdomen soft and non-tender without masses, organomegaly or hernias noted. Msk:  No deformity or scoliosis noted of thoracic or lumbar spine.   Extremities:  no edema   Impression & Recommendations:  Problem # 1:  UTI (ICD-599.0)  Problem # 2:  DIASTOLIC HEART FAILURE, CHRONIC (ICD-428.32)  Her  updated medication  list for this problem includes:    Benazepril Hcl 40 Mg Tabs (Benazepril hcl) ..... One by mouth daily    Bayer Aspirin 325 Mg Tabs (Aspirin) .Marland Kitchen... 1 once daily    Metoprolol Tartrate 50 Mg Tabs (Metoprolol tartrate) .Marland Kitchen... 1 two times a day    Spironolactone 25 Mg Tabs (Spironolactone) .Marland Kitchen... 1 once daily  Problem # 3:  URI (ICD-465.9)  Her updated medication list for this problem includes:    Celebrex 200 Mg Caps (Celecoxib) ..... One by mouth daily    Bayer Aspirin 325 Mg Tabs (Aspirin) .Marland Kitchen... 1 once daily  Problem # 4:  HYPERTENSION (ICD-401.9)  Her updated medication list for this problem includes:    Benazepril Hcl 40 Mg Tabs (Benazepril hcl) ..... One by mouth daily    Metoprolol Tartrate 50 Mg Tabs (Metoprolol tartrate) .Marland Kitchen... 1 two times a day    Spironolactone 25 Mg Tabs (Spironolactone) .Marland Kitchen... 1 once daily  Complete Medication List: 1)  Benazepril Hcl 40 Mg Tabs (Benazepril hcl) .... One by mouth daily 2)  Celebrex 200 Mg Caps (Celecoxib) .... One by mouth daily 3)  Lorazepam 0.5 Mg Tabs (Lorazepam) .Marland Kitchen.. 1 q8h as needed anxiety 4)  Premarin 0.625 Mg/gm Crea (Estrogens, conjugated) .... As needed 5)  Prilosec 20 Mg Cpdr (Omeprazole) .... One by mouth daily 6)  Neurontin 600 Mg Tabs (Gabapentin) .... One tid 7)  Hydrocodone-acetaminophen 5-500 Mg Tabs (Hydrocodone-acetaminophen) .... One every 6 hs for pain 8)  Trazodone Hcl 100 Mg Tabs (Trazodone hcl) .... 1/2 at bedtime 9)  Bayer Aspirin 325 Mg Tabs (Aspirin) .Marland Kitchen.. 1 once daily 10)  Metoprolol Tartrate 50 Mg Tabs (Metoprolol tartrate) .Marland Kitchen.. 1 two times a day 11)  Simvastatin 40 Mg Tabs (Simvastatin) .Marland Kitchen.. 1 once daily 12)  Nitrostat 0.4 Mg Subl (Nitroglycerin) .... Use as needed 13)  Spironolactone 25 Mg Tabs (Spironolactone) .Marland Kitchen.. 1 once daily  Other Orders: UA Dipstick w/o Micro (manual) (16[address])  Patient Instructions: 1)  Get plenty of rest, drink lots of clear liquids, and use Tylenol or Ibuprofen for  fever and comfort. Return in 7-10 days if you're not better:sooner if you're feeling worse. 2)  Check your Blood Pressure regularly. If it is above: you should make an appointment. 3)  Take your antibiotic as prescribed until ALL of it is gone, but stop if you develop a rash or swelling and contact our office as soon as possible. 4)  Please schedule a follow-up appointment in 3 months. Prescriptions: HYDROCODONE-ACETAMINOPHEN 5-500 MG  TABS (HYDROCODONE-ACETAMINOPHEN) one every 6 hs for pain  #90 x 6   Entered and Authorized by:   Gordy Savers  MD   Signed by:   Gordy Savers  MD on 08/09/2009   Method used:   Print then Give to Patient   RxID:   [id number]   Laboratory Results   Urine Tests    Routine Urinalysis   Color: yellow Appearance: Clear Glucose: negative   (Normal Range: Negative) Bilirubin: negative   (Normal Range: Negative) Ketone: negative   (Normal Range: Negative) Spec. Gravity: >=1.030   (Normal Range: 1.003-1.035) Blood: negative   (Normal Range: Negative) pH: 5.0   (Normal Range: 5.0-8.0) Protein: trace   (Normal Range: Negative) Urobilinogen: 0.2   (Normal Range: 0-1) Nitrite: positive   (Normal Range: Negative) Leukocyte Esterace: small   (Normal Range: Negative)

## 2010-08-16 NOTE — Progress Notes (Signed)
Summary: lab results   Phone Note Call from Patient Call back at Home Phone [phone]   Caller: patient triage message Call For: K  Summary of Call: lab work results  Initial call taken by: Roselle Locus,  September 01, 2007 8:52 AM    Called

## 2010-08-16 NOTE — Letter (Signed)
Summary: [facility]   Imported By: Maryln Gottron 09/28/2008 11:32:21  _____________________________________________________________________  External Attachment:    Type:   Image     Comment:   External Document

## 2010-08-16 NOTE — Assessment & Plan Note (Signed)
Summary: f67m   Visit Type:  Follow-up Primary Provider:  Gordy Savers  MD  CC:  LE edema.  History of Present Illness: Ms. [name] is a delightful 77 year old woman with a history of hypertension, obesity, diastolic heart failure, hyperlipidemia, severe osteo arthirits s/p TKA x 4 (L x , R x 1) and coronary artery disease s/p BMS to RCA in 4/09.  LV function is normal.  She returns today for follow-up. Still having neck pain even after laminenctomy. Also having problems with severe leg pain L >> R. Pain worst behind L knee. Hurts all the time. Can't sleep well. No cyanosis or ulceration. Mild relief with neurontin.   Continues to have chronic orthopnea. Feels like she is getting more SOB when she walks her dog. Weight stable. No CP.  No fevers or chills. No cough.   Current Medications (verified): 1)  Benazepril Hcl 40 Mg Tabs (Benazepril Hcl) .... One By Mouth Daily 2)  Celebrex 200 Mg Caps (Celecoxib) .... One By Mouth Daily 3)  Lorazepam 0.5 Mg Tabs (Lorazepam) .Marland Kitchen.. 1 Q8h As Needed Anxiety 4)  Premarin 0.625 Mg/gm  Crea (Estrogens, Conjugated) .... As Needed 5)  Prilosec 20 Mg  Cpdr (Omeprazole) .... One By Mouth Daily 6)  Neurontin 600 Mg  Tabs (Gabapentin) .... One Tid 7)  Hydrocodone-Acetaminophen 5-500 Mg  Tabs (Hydrocodone-Acetaminophen) .... One Every 6 Hs For Pain 8)  Trazodone Hcl 100 Mg  Tabs (Trazodone Hcl) .... 1/2 At Bedtime 9)  Bayer Aspirin 325 Mg Tabs (Aspirin) .Marland Kitchen.. 1 Once Daily 10)  Metoprolol Tartrate 50 Mg Tabs (Metoprolol Tartrate) .Marland Kitchen.. 1 Two Times A Day 11)  Simvastatin 40 Mg Tabs (Simvastatin) .Marland Kitchen.. 1 Once Daily 12)  Nitrostat 0.4 Mg Subl (Nitroglycerin) .... Use As Needed 13)  Spironolactone 25 Mg Tabs (Spironolactone) .Marland Kitchen.. 1 Once Daily 14)  Ciprofloxacin Hcl 500 Mg Tabs (Ciprofloxacin Hcl) .... One Twice Daily For 10 Days  Allergies (verified): No Known Drug Allergies  Past History:  Past Medical History: Last updated:  02/27/2009 Hypertension Advanced DJD Colonic polyps, hx of Diverticulosis, colon GERD w/esophageal strictures Hiatal hernia Coronary artery disease      -s/p BMS RCA 4/09 Diastolic HF  Vital Signs:  Patient profile:   77 year old female Height:      53 inches Weight:      200 pounds BMI:     50.24 Pulse rate:   77 / minute Resp:     18 per minute BP sitting:   142 / 78  (right arm)  Vitals Entered By: Marrion Coy, CNA (Simar  2, 2011 10:05 AM)  Physical Exam  General:  Gen: eldelry well appearing. no resp difficulty HEENT: normal Neck: supple. no JVD. Carotids 2+ bilat; no bruits. No lymphadenopathy or thryomegaly appreciated. Cor: PMI nondisplaced. Regular rate & rhythm. No rubs,  murmur. +s4 Lungs: clear Abdomen: obese soft, nontender, nondistended.  Good bowel sounds. Extremities: no cyanosis, clubbing, rash, trace edema L > R DPs 2+ bilaterally no ucleration Neuro: alert & orientedx3, cranial nerves grossly intact. moves all 4 extremities w/o difficulty. affect pleasant    Impression & Recommendations:  Problem # 1:  DYSPNEA (ICD-786.05) Unclear etiology. My be mostly deconditioning with some diastolic dysfunction though volume status looks good. Check echo, BNP, CBC, CXR. Walk in office to check sats. If sx persist can consider repeat stress test.  Problem # 2:  CORONARY ARTERY DISEASE (ICD-414.00) Stable. No evidence of ischemia. Continue current regimen.  Problem # 3:  LIMB  PAIN (ICD-729.5) Doubt leg pain is claudication as pulses very strong. Suspect musculoskeletal/neuropathic. W/u per Dr. Amador Cunas.   Other Orders: Echocardiogram (Echo) EKG w/ Interpretation (93000) TLB-BMP (Basic Metabolic Panel-BMET) (80048-METABOL) TLB-BNP (B-Natriuretic Peptide) (83880-BNPR) TLB-CBC Platelet - w/Differential (85025-CBCD) T-2 View CXR (71020TC)  Patient Instructions: 1)  Labs today 2)  A chest x-ray takes a picture of the organs and structures inside the chest,  including the heart, lungs, and blood vessels. This test can show several things, including, whether the heart is enlarged; whether fluid is building up in the lungs; and whether pacemaker / defibrillator leads are still in place. 3)  Your physician has requested that you have an echocardiogram.  Echocardiography is a painless test that uses sound waves to create images of your heart. It provides your doctor with information about the size and shape of your heart and how well your heart's chambers and valves are working.  This procedure takes approximately one hour. There are no restrictions for this procedure. 4)  Follow up in 2 months  Appended Document: f27m ok. i want to look at echo to see MR

## 2010-08-16 NOTE — Letter (Signed)
Summary: Outpatient Coinsurance Notice  Outpatient Coinsurance Notice   Imported By: Marylou Mccoy 12/05/2009 09:53:53  _____________________________________________________________________  External Attachment:    Type:   Image     Comment:   External Document

## 2010-08-16 NOTE — Miscellaneous (Signed)
Summary: Orders Update  Clinical Lists Changes  Orders: Added new Test order of T-2 View CXR (71020TC) - Signed 

## 2010-08-16 NOTE — Progress Notes (Signed)
Summary: needs test results  Phone Note Call from Patient Call back at Home Phone [phone]   Caller: Patient Call For: dr Kirtland Bouchard Reason for Call: Talk to Nurse Summary of Call: pt would like test results  Follow-up for Phone Call        Phone Call Completed Follow-up by: Raechel Ache, RN,  July 07, 2007 4:34 PM    New/Updated Medications: NU-IRON 150 MG  CAPS (POLYSACCHARIDE IRON COMPLEX) 1 once daily still has moderate iron deficiency anemia;  try to resume Iron three times per week  Prescriptions: NU-IRON 150 MG  CAPS (POLYSACCHARIDE IRON COMPLEX) 1 once daily  #100 x 0   Entered by:   Raechel Ache, RN   Authorized by:   Gordy Savers  MD   Signed by:   Raechel Ache, RN on 07/07/2007   Method used:   Electronically sent to ...       [address]       [address]       [place], [place]  [address]       Ph: [phone]       Fax: [phone]   RxID:   [id number]

## 2010-08-16 NOTE — Assessment & Plan Note (Signed)
Summary: 3 month rov/njr   Vital Signs:  Patient profile:   77 year old [name] Weight:      200 pounds Temp:     97.7 degrees F oral BP sitting:   150 / 80  (right arm) Cuff size:   regular  Vitals Entered By: Duard Brady LPN (February 07, 2010 9:51 AM) CC: 3 mos rov - ok , c/o on going knee pain, check (L) lower leg sore    Primary Care Provider:  Gordy Savers  MD  CC:  3 mos rov - ok , c/o on going knee pain, and check (L) lower leg sore .  History of Present Illness: 77 year old patient who is seen today for follow-up.  She was placed on sertraline, approximately 4 weeks ago.  She took this only a very short period time and discontinued due to some diaphoresis of the head and neck area.  She has a history of chronic pain due to her advanced arthritis and sleeps very poorly.  There has been some associated depression.  She's had a recent cardiac evaluation.  She has hypertension and dyslipidemia  Allergies (verified): No Known Drug Allergies  Past History:  Past Medical History: Hypertension Advanced DJD Colonic polyps, hx of Diverticulosis, colon GERD w/esophageal strictures Hiatal hernia Coronary artery disease      -s/p BMS RCA 4/09 Diastolic HF  Depression  Review of Systems       The patient complains of muscle weakness, difficulty walking, and depression.  The patient denies anorexia, fever, weight loss, weight gain, vision loss, decreased hearing, hoarseness, chest pain, syncope, dyspnea on exertion, peripheral edema, prolonged cough, headaches, hemoptysis, abdominal pain, melena, hematochezia, severe indigestion/heartburn, hematuria, incontinence, genital sores, suspicious skin lesions, transient blindness, unusual weight change, abnormal bleeding, enlarged lymph nodes, angioedema, and breast masses.    Physical Exam  General:  overweight-appearing.  alert and appropriate blood pressure 146/80overweight-appearing.   Head:  Normocephalic and  atraumatic without obvious abnormalities. No apparent alopecia or balding. Eyes:  No corneal or conjunctival inflammation noted. EOMI. Perrla. Funduscopic exam benign, without hemorrhages, exudates or papilledema. Vision grossly normal. Mouth:  Oral mucosa and oropharynx without lesions or exudates.  Teeth in good repair. Neck:  No deformities, masses, or tenderness noted. Lungs:  Normal respiratory effort, chest expands symmetrically. Lungs are clear to auscultation, no crackles or wheezes. Heart:  Normal rate and regular rhythm. S1 and S2 normal without gallop, murmur, click, rub or other extra sounds. Abdomen:  Bowel sounds positive,abdomen soft and non-tender without masses, organomegaly or hernias noted. Msk:  No deformity or scoliosis noted of thoracic or lumbar spine.   Extremities:  No clubbing, cyanosis, edema, or deformity noted with normal full range of motion of all joints.     Impression & Recommendations:  Problem # 1:  DEPRESSION (ICD-311)  Her updated medication list for this problem includes:    Lorazepam 0.5 Mg Tabs (Lorazepam) .Marland Kitchen... 1 q8h as needed anxiety    Trazodone Hcl 100 Mg Tabs (Trazodone hcl) .Marland Kitchen... 1/2 at bedtime    Sertraline Hcl 50 Mg Tabs (Sertraline hcl) ..... One every am will rechallenge with the sertraline and if not well tolerated.  Will consider alternative medication  Her updated medication list for this problem includes:    Lorazepam 0.5 Mg Tabs (Lorazepam) .Marland Kitchen... 1 q8h as needed anxiety    Trazodone Hcl 100 Mg Tabs (Trazodone hcl) .Marland Kitchen... 1/2 at bedtime    Sertraline Hcl 50 Mg Tabs (Sertraline hcl) ..... One every  am  Problem # 2:  DEGENERATIVE JOINT DISEASE (ICD-715.90)  Her updated medication list for this problem includes:    Celebrex 200 Mg Caps (Celecoxib) ..... One by mouth daily    Hydrocodone-acetaminophen 5-500 Mg Tabs (Hydrocodone-acetaminophen) ..... One every 6 hs for pain    Bayer Aspirin 325 Mg Tabs (Aspirin) .Marland Kitchen... 1 once daily  Her  updated medication list for this problem includes:    Celebrex 200 Mg Caps (Celecoxib) ..... One by mouth daily    Hydrocodone-acetaminophen 5-500 Mg Tabs (Hydrocodone-acetaminophen) ..... One every 6 hs for pain    Bayer Aspirin 325 Mg Tabs (Aspirin) .Marland Kitchen... 1 once daily  Complete Medication List: 1)  Benazepril Hcl 40 Mg Tabs (Benazepril hcl) .... One by mouth daily 2)  Celebrex 200 Mg Caps (Celecoxib) .... One by mouth daily 3)  Lorazepam 0.5 Mg Tabs (Lorazepam) .Marland Kitchen.. 1 q8h as needed anxiety 4)  Premarin 0.625 Mg/gm Crea (Estrogens, conjugated) .... As needed 5)  Prilosec 20 Mg Cpdr (Omeprazole) .... One by mouth daily 6)  Neurontin 600 Mg Tabs (Gabapentin) .... One tid 7)  Hydrocodone-acetaminophen 5-500 Mg Tabs (Hydrocodone-acetaminophen) .... One every 6 hs for pain 8)  Trazodone Hcl 100 Mg Tabs (Trazodone hcl) .... 1/2 at bedtime 9)  Bayer Aspirin 325 Mg Tabs (Aspirin) .Marland Kitchen.. 1 once daily 10)  Metoprolol Tartrate 50 Mg Tabs (Metoprolol tartrate) .Marland Kitchen.. 1 two times a day 11)  Simvastatin 40 Mg Tabs (Simvastatin) .Marland Kitchen.. 1 once daily 12)  Nitrostat 0.4 Mg Subl (Nitroglycerin) .... Use as needed 13)  Spironolactone 25 Mg Tabs (Spironolactone) .Marland Kitchen.. 1 once daily 14)  Ciprofloxacin Hcl 500 Mg Tabs (Ciprofloxacin hcl) .... One twice daily for 10 days 15)  Sertraline Hcl 50 Mg Tabs (Sertraline hcl) .... One every am  Patient Instructions: 1)  Please schedule a follow-up appointment in 3 months. 2)  Limit your Sodium (Salt) to less than 2 grams a day(slightly less than 1/2 a teaspoon) to prevent fluid retention, swelling, or worsening of symptoms. Prescriptions: HYDROCODONE-ACETAMINOPHEN 5-500 MG  TABS (HYDROCODONE-ACETAMINOPHEN) one every 6 hs for pain  #90 x 3   Entered and Authorized by:   Gordy Savers  MD   Signed by:   Gordy Savers  MD on 02/07/2010   Method used:   Print then Give to Patient   RxID:   [id number] LORAZEPAM 0.5 MG TABS (LORAZEPAM) 1 q8h as needed anxiety   #90 x 2   Entered and Authorized by:   Gordy Savers  MD   Signed by:   Gordy Savers  MD on 02/07/2010   Method used:   Print then Give to Patient   RxID:   [id number]

## 2010-08-16 NOTE — Progress Notes (Signed)
  Phone Note Outgoing Call   Call placed by: Raechel Ache, RN,  October 08, 2007 2:51 PM Summary of Call: [facility]

## 2010-08-16 NOTE — Progress Notes (Signed)
  Phone Note Call from Patient Call back at [facility] Phone [phone]   Summary of Call: worsened with bronchitis purulent sputum  P: will start with antibiotic     needs follow up with Dr Kirtland Bouchard Initial call taken by: Cindee Salt MD,  September 14, 2007 10:41 AM    New/Updated Medications: ZITHROMAX Z-PAK 250 MG  TABS (AZITHROMYCIN) take as directed   Prescriptions: ZITHROMAX Z-PAK 250 MG  TABS (AZITHROMYCIN) take as directed  #1 x 0   Entered and Authorized by:   Cindee Salt MD   Signed by:   Cindee Salt MD on 09/14/2007   Method used:   Electronically sent to ...       [facility] [id number]       [address]       [place], [place]  [address]       Ph: [phone]       Fax: [phone]   RxID:   [id number]

## 2010-08-16 NOTE — Assessment & Plan Note (Signed)
Summary: PER CHECK OUT/SF   Visit Type:  Follow-up Primary Provider:  Gordy Savers  MD   History of Present Illness: [name] is a delightful 77 year old woman with a history of hypertension, obesity, diastolic heart failure, hyperlipidemia, and coronary artery disease s/p BMS to RCA in 4/09.  LV function is normal.  She returns today for follow-up. Recently had neck surgery by Dr. Otelia Sergeant for 3-level cervical laminectomy. Doing well no CP, edema or signifcant SOB. Does have mild chroni orthopnea. No PND. Walks with a cane.    Current Medications (verified): 1)  Benazepril Hcl 40 Mg Tabs (Benazepril Hcl) .... One By Mouth Daily 2)  Celebrex 200 Mg Caps (Celecoxib) .... One By Mouth Daily 3)  Lorazepam 0.5 Mg Tabs (Lorazepam) .Marland Kitchen.. 1 Q8h As Needed Anxiety 4)  Premarin 0.625 Mg/gm  Crea (Estrogens, Conjugated) .... As Needed 5)  Prilosec 20 Mg  Cpdr (Omeprazole) .... One By Mouth Daily 6)  Neurontin 600 Mg  Tabs (Gabapentin) .... One Tid 7)  Hydrocodone-Acetaminophen 5-500 Mg  Tabs (Hydrocodone-Acetaminophen) .... One Every 6 Hs For Pain 8)  Trazodone Hcl 100 Mg  Tabs (Trazodone Hcl) .... 1/2 At Bedtime 9)  Bayer Aspirin 325 Mg Tabs (Aspirin) .Marland Kitchen.. 1 Once Daily 10)  Metoprolol Tartrate 50 Mg Tabs (Metoprolol Tartrate) .Marland Kitchen.. 1 Two Times A Day 11)  Simvastatin 40 Mg Tabs (Simvastatin) .Marland Kitchen.. 1 Once Daily 12)  Nitrostat 0.4 Mg Subl (Nitroglycerin) .... Use As Needed 13)  Spironolactone 25 Mg Tabs (Spironolactone) .Marland Kitchen.. 1 Once Daily  Allergies (verified): No Known Drug Allergies  Past History:  Past Medical History: Hypertension Advanced DJD Colonic polyps, hx of Diverticulosis, colon GERD w/esophageal strictures Hiatal hernia Coronary artery disease      -s/p BMS RCA 4/09 Diastolic HF  Review of Systems       As per HPI and past medical history; otherwise all systems negative.   Vital Signs:  Patient profile:   77 year old female Height:      53 inches Weight:      204  pounds BMI:     51.24 Pulse rate:   64 / minute BP sitting:   118 / 70  (left arm)  Vitals Entered By: Laurance Flatten CMA (February 27, 2009 10:16 AM)  Physical Exam  General:  Gen: elderly in hard cervical collar. no resp difficulty HEENT: normal Neck: in hard collar Cor: PMI nondisplaced. Regular rate & rhythm. No rubs, gallops, murmur. Lungs: clear Abdomen: soft, nontender, nondistended. No hepatosplenomegaly. No bruits or masses. Good bowel sounds. Extremities: no cyanosis, clubbing, rash, edema Neuro: alert & orientedx3, cranial nerves grossly intact. moves all 4 extremities w/o difficulty. affect pleasant    Impression & Recommendations:  Problem # 1:  CORONARY ARTERY DISEASE (ICD-414.00) Stable. No evidence of ischemia. Continue current regimen.  Problem # 2:  DIASTOLIC HEART FAILURE, CHRONIC (ICD-428.32) Stable. Volume status looks good.  Problem # 3:  PURE HYPERCHOLESTEROLEMIA (ICD-272.0) Followed by Dr. Amador Cunas. Goal LDL < 70. Continue statin.  Problem # 4:  HYPERTENSION (ICD-401.9) Looks good. Continue current regimen.  Patient Instructions: 1)  Follow up in 9 monhts Prescriptions: SPIRONOLACTONE 25 MG TABS (SPIRONOLACTONE) 1 once daily  #90 x 3   Entered by:   Meredith Staggers, RN   Authorized by:   Dolores Patty, MD, [facility]   Signed by:   Meredith Staggers, RN on 02/27/2009   Method used:   Electronically to        [facility]  [address]* (retail)       [address]       [place], [place]  [address]       Ph: [phone]       Fax: [phone]   RxID:   [id number]

## 2010-08-16 NOTE — Assessment & Plan Note (Signed)
Summary: chest congestion,sinus/jls   Vital Signs:  Patient Profile:   77 Years Old Female Weight:      200 pounds Temp:     98.1 degrees F oral BP sitting:   152 / 74  (left arm)  Vitals Entered By: Raechel Ache, RN (September 09, 2007 10:37 AM)                 Chief Complaint:  C/o head and chest cong with productive cough; grayish phlegm since Sunday. Chest and back sore.Marland Kitchen  History of Present Illness:  77 year old patient seen today for follow up.For  the past 5 days, she has had  increasing chest congestion and cough.  There's been no fever.  She is on benazaprill  for blood pressure control    Current Allergies: No known allergies   Past Medical History:    Reviewed history from 01/21/2007 and no changes required:       Hypertension       Advanced DJD       Colonic polyps, hx of       Diverticulosis, colon       GERD w/esophageal strictures       Hiatal hernia   Family History:    Reviewed history from 05/19/2007 and no changes required:       father diedt age 85 and her heart condition       mother died age 4, heart failure, and significant osteoarthritis              Four brothers three half brothers Posicor R. disease with renal failure       three half sisters  Social History:    Reviewed history and no changes required:     Physical Exam  General:     overweight-appearing.   Head:     Normocephalic and atraumatic without obvious abnormalities. No apparent alopecia or balding. Eyes:     No corneal or conjunctival inflammation noted. EOMI. Perrla. Funduscopic exam benign, without hemorrhages, exudates or papilledema. Vision grossly normal. Mouth:     Oral mucosa and oropharynx without lesions or exudates.  Teeth in good repair. Neck:     No deformities, masses, or tenderness noted. Lungs:      coarse rhonchi bilaterally.  Prolonged expiratory phase Heart:     Normal rate and regular rhythm. S1 and S2 normal without gallop, murmur, click,  rub or other extra sounds.    Impression & Recommendations:  Problem # 1:  BRONCHITIS-ACUTE (ICD-466.0) will add Advair b.i.d. for two weeks continue her expectorants and anti-tussis  Problem # 2:  HYPERTENSION (ICD-401.9)  Her updated medication list for this problem includes:    Benazepril Hcl 40 Mg Tabs (Benazepril hcl) ..... One by mouth daily    Hydrochlorothiazide 25 Mg Tabs (Hydrochlorothiazide) ..... One by mouth daily in view of her cough will substitute Benicar for 4 weeks  Complete Medication List: 1)  Benazepril Hcl 40 Mg Tabs (Benazepril hcl) .... One by mouth daily 2)  Celebrex 200 Mg Caps (Celecoxib) .... One by mouth daily 3)  Lorazepam 0.5 Mg Tabs (Lorazepam) 4)  Premarin 0.625 Mg/gm Crea (Estrogens, conjugated) .... Use vaginally three times daily for 2 weeks, then two times weekly 5)  Hydrochlorothiazide 25 Mg Tabs (Hydrochlorothiazide) .... One by mouth daily 6)  Prilosec 20 Mg Cpdr (Omeprazole) .... One by mouth daily 7)  Klor-con M20 20 Meq Tbcr (Potassium chloride crys cr) .... One by mouth daily 8)  Neurontin 600 Mg  Tabs (Gabapentin) .... One tid 9)  Metoclopramide Hcl 10 Mg Tabs (Metoclopramide hcl) .... One daily before evening meal 10)  Hydrocodone-acetaminophen 5-500 Mg Tabs (Hydrocodone-acetaminophen) .... One every 6 hs for pain 11)  Trazodone Hcl 100 Mg Tabs (Trazodone hcl) .... 1/2 at bedtime 12)  Nu-iron 150 Mg Caps (Polysaccharide iron complex) .Marland Kitchen.. 1 once daily 13)  Propoxyphene N-apap 100-650 Mg Tabs (Propoxyphene n-apap) .... One every 6 hours for pain   Patient Instructions: 1)  Please schedule a follow-up appointment in 3 months. 2)  Acute bronchitis symptoms for less than 10 days are not helped by antibiotics. take over the counter cough medications. call if no improvment in  5-7 days, sooner if increasing cough, fever, or new symptoms( shortness of breath, chest pain).    Prescriptions: PROPOXYPHENE N-APAP 100-650 MG  TABS (PROPOXYPHENE  N-APAP) one every 6 hours for pain  #90 x 3   Entered and Authorized by:   Gordy Savers  MD   Signed by:   Gordy Savers  MD on 09/09/2007   Method used:   Print then Give to Patient   RxID:   [id number] LORAZEPAM 0.5 MG TABS (LORAZEPAM)   #90 x 6   Entered and Authorized by:   Gordy Savers  MD   Signed by:   Gordy Savers  MD on 09/09/2007   Method used:   Print then Give to Patient   RxID:   [id number] PROPOXYPHENE N-APAP 100-650 MG  TABS (PROPOXYPHENE N-APAP) one every 6 hours for pain  #90 x 3   Entered and Authorized by:   Gordy Savers  MD   Signed by:   Gordy Savers  MD on 09/09/2007   Method used:   Electronically sent to ...       [address]       [address]       [place], [place]  [address]       Ph: [phone]       Fax: [phone]   RxID:   [id number]  ]

## 2010-08-16 NOTE — Progress Notes (Signed)
Summary: depression med  Phone Note Call from Patient   Caller: [name], [phone] Summary of Call: Major depression.  Daughter getting married tomorrow.  Cannot get her there today. Will get her in next week.  [place].  NKDA. Initial call taken by: Rudy Jew, RN,  December 15, 2009 10:45 AM  Follow-up for Phone Call        sertraline 50  #50 one every am Follow-up by: Gordy Savers  MD,  December 15, 2009 12:30 PM  Additional Follow-up for Phone Call Additional follow up Details #1::        Phone Call Completed.  Left message to inform. Additional Follow-up by: Rudy Jew, RN,  December 15, 2009 2:17 PM    New/Updated Medications: SERTRALINE HCL 50 MG TABS (SERTRALINE HCL) One every am Prescriptions: SERTRALINE HCL 50 MG TABS (SERTRALINE HCL) One every am  #50 x 0   Entered by:   Rudy Jew, RN   Authorized by:   Gordy Savers  MD   Signed by:   Rudy Jew, RN on 12/15/2009   Method used:   Electronically to        [id number] (retail)       [address]       [place], [place]  [id number]       Ph: [phone]       Fax: [phone]   RxID:   [id number]

## 2010-08-16 NOTE — Progress Notes (Signed)
Summary: xray results  Phone Note Call from Patient Call back at Home Phone [phone] Call back at [phone]   Caller: Daughter/Robin Summary of Call: CT results Initial call taken by: Judie Grieve,  March 14, 2010 3:15 PM  Follow-up for Phone Call        left mess on ID VM chest xray ok Meredith Staggers, RN  March 14, 2010 5:38 PM

## 2010-08-16 NOTE — Assessment & Plan Note (Signed)
Summary: 3 month follow up/cjr   Vital Signs:  Patient profile:   77 year old female Weight:      190 pounds Temp:     98.4 degrees F oral BP sitting:   120 / 70  (left arm) Cuff size:   regular CC: 3 mos rov - not feeling well, shakey , bodyaches Is Patient Diabetic? No   Primary Care Provider:  Gordy Savers  MD  CC:  3 mos rov - not feeling well, shakey , and bodyaches.  History of Present Illness: 59 old for follow-up; she has a history of hypertension, diastolic heart failure, gastroesophageal reflux disease.  For the past 5 days, she has had fever, weakness, and generalized myalgias.  For the past day or two, she has felt much improved.  She had a routine follow-up on the schedule today and waited until her routine visit.  She has significant osteoarthritis and is request and a less expensive alternative than Celebrex.  Denies any symptoms of heart failure. Her blood pressure has been well-controlled.  She remains on simvastatin for dyslipidemia, which he continues to tolerate  Allergies (verified): No Known Drug Allergies  Past History:  Past Medical History: Reviewed history from 02/07/2010 and no changes required. Hypertension Advanced DJD Colonic polyps, hx of Diverticulosis, colon GERD w/esophageal strictures Hiatal hernia Coronary artery disease      -s/p BMS RCA 4/09 Diastolic HF  Depression  Past Surgical History: Reviewed history from 03/29/2009 and no changes required. Appendectomy Cholecystectomy Hysterectomy Lumbar laminectomy Total knee replacement-bilateral; status post left total knee  replacement x 3 Rotator cuff repair status post cardiac stents, April 2009 s/p Cervical fusion 01-2009 Otelia Sergeant)  Family History: Reviewed history from 04/05/2008 and no changes required. father died age 29 and her heart condition mother died age 17, heart failure, and significant osteoarthritis  Four brothers three half brothers Posicor R. disease with  renal failure three half sisters  Review of Systems       The patient complains of anorexia, fever, prolonged cough, muscle weakness, and difficulty walking.  The patient denies weight loss, weight gain, vision loss, decreased hearing, hoarseness, chest pain, syncope, dyspnea on exertion, peripheral edema, headaches, hemoptysis, abdominal pain, melena, hematochezia, severe indigestion/heartburn, hematuria, incontinence, genital sores, suspicious skin lesions, transient blindness, depression, unusual weight change, abnormal bleeding, enlarged lymph nodes, angioedema, and breast masses.    Physical Exam  General:  overweight-appearing.  no distress.  Blood pressure 120/70overweight-appearing.   Head:  Normocephalic and atraumatic without obvious abnormalities. No apparent alopecia or balding. Eyes:  No corneal or conjunctival inflammation noted. EOMI. Perrla. Funduscopic exam benign, without hemorrhages, exudates or papilledema. Vision grossly normal. Ears:  External ear exam shows no significant lesions or deformities.  Otoscopic examination reveals clear canals, tympanic membranes are intact bilaterally without bulging, retraction, inflammation or discharge. Hearing is grossly normal bilaterally. Mouth:  Oral mucosa and oropharynx without lesions or exudates.  Teeth in good repair. Neck:  No deformities, masses, or tenderness noted. Lungs:  Normal respiratory effort, chest expands symmetrically. Lungs are clear to auscultation, no crackles or wheezes.  O2 saturation 97% Heart:  Normal rate and regular rhythm. S1 and S2 normal without gallop, murmur, click, rub or other extra sounds.  pulse rate 70 Abdomen:  Bowel sounds positive,abdomen soft and non-tender without masses, organomegaly or hernias noted. Msk:  No deformity or scoliosis noted of thoracic or lumbar spine.   Pulses:  R and L carotid,radial,femoral,dorsalis pedis and posterior tibial pulses are full  and equal  bilaterally Extremities:  No clubbing, cyanosis, edema, or deformity noted with normal full range of motion of all joints.     Impression & Recommendations:  Problem # 1:  COUGH (ICD-786.2) patient has a URI which has largely resolved  Problem # 2:  DIASTOLIC HEART FAILURE, CHRONIC (ICD-428.32)  Her updated medication list for this problem includes:    Benazepril Hcl 40 Mg Tabs (Benazepril hcl) ..... One by mouth daily    Bayer Aspirin 325 Mg Tabs (Aspirin) .Marland Kitchen... 1 once daily    Metoprolol Tartrate 50 Mg Tabs (Metoprolol tartrate) .Marland Kitchen... 1 two times a day    Spironolactone 25 Mg Tabs (Spironolactone) .Marland Kitchen... 1 once daily    Furosemide 20 Mg Tabs (Furosemide) .Marland Kitchen... Take one tablet by mouth daily as needed  Her updated medication list for this problem includes:    Benazepril Hcl 40 Mg Tabs (Benazepril hcl) ..... One by mouth daily    Bayer Aspirin 325 Mg Tabs (Aspirin) .Marland Kitchen... 1 once daily    Metoprolol Tartrate 50 Mg Tabs (Metoprolol tartrate) .Marland Kitchen... 1 two times a day    Spironolactone 25 Mg Tabs (Spironolactone) .Marland Kitchen... 1 once daily    Furosemide 20 Mg Tabs (Furosemide) .Marland Kitchen... Take one tablet by mouth daily as needed  Problem # 3:  PURE HYPERCHOLESTEROLEMIA (ICD-272.0)  Her updated medication list for this problem includes:    Simvastatin 40 Mg Tabs (Simvastatin) .Marland Kitchen... 1 once daily  Her updated medication list for this problem includes:    Simvastatin 40 Mg Tabs (Simvastatin) .Marland Kitchen... 1 once daily  Problem # 4:  DEGENERATIVE JOINT DISEASE (ICD-715.90)  The following medications were removed from the medication list:    Celebrex 200 Mg Caps (Celecoxib) ..... One by mouth daily Her updated medication list for this problem includes:    Hydrocodone-acetaminophen 5-500 Mg Tabs (Hydrocodone-acetaminophen) ..... One every 6 hs for pain    Bayer Aspirin 325 Mg Tabs (Aspirin) .Marland Kitchen... 1 once daily    Meloxicam 15 Mg Tabs (Meloxicam) ..... One daily  The following medications were removed from the  medication list:    Celebrex 200 Mg Caps (Celecoxib) ..... One by mouth daily Her updated medication list for this problem includes:    Hydrocodone-acetaminophen 5-500 Mg Tabs (Hydrocodone-acetaminophen) ..... One every 6 hs for pain    Bayer Aspirin 325 Mg Tabs (Aspirin) .Marland Kitchen... 1 once daily  Complete Medication List: 1)  Benazepril Hcl 40 Mg Tabs (Benazepril hcl) .... One by mouth daily 2)  Lorazepam 0.5 Mg Tabs (Lorazepam) .Marland Kitchen.. 1 q8h as needed anxiety 3)  Premarin 0.625 Mg/gm Crea (Estrogens, conjugated) .... As needed 4)  Prilosec 20 Mg Cpdr (Omeprazole) .... One by mouth daily 5)  Neurontin 600 Mg Tabs (Gabapentin) .... One tid 6)  Hydrocodone-acetaminophen 5-500 Mg Tabs (Hydrocodone-acetaminophen) .... One every 6 hs for pain 7)  Trazodone Hcl 100 Mg Tabs (Trazodone hcl) .... 1/2 at bedtime 8)  Bayer Aspirin 325 Mg Tabs (Aspirin) .Marland Kitchen.. 1 once daily 9)  Metoprolol Tartrate 50 Mg Tabs (Metoprolol tartrate) .Marland Kitchen.. 1 two times a day 10)  Simvastatin 40 Mg Tabs (Simvastatin) .Marland Kitchen.. 1 once daily 11)  Nitrostat 0.4 Mg Subl (Nitroglycerin) .... Use as needed 12)  Spironolactone 25 Mg Tabs (Spironolactone) .Marland Kitchen.. 1 once daily 13)  Sertraline Hcl 50 Mg Tabs (Sertraline hcl) .... One every am/  pt not taking every day as directed 14)  Furosemide 20 Mg Tabs (Furosemide) .... Take one tablet by mouth daily as needed 15)  Hydrocodone-homatropine 5-1.5 Mg/58ml  Syrp (Hydrocodone-homatropine) .Marland Kitchen.. 1 teaspoon every 6 hours as needed for cough or congestion 16)  Azithromycin 250 Mg Tabs (Azithromycin) .... Two initially, then one daily for 4 additional days 17)  Meloxicam 15 Mg Tabs (Meloxicam) .... One daily  Patient Instructions: 1)  Limit your Sodium (Salt). 2)  It is important that you exercise regularly at least 20 minutes 5 times a week. If you develop chest pain, have severe difficulty breathing, or feel very tired , stop exercising immediately and seek medical attention. 3)  You need to lose weight.  Consider a lower calorie diet and regular exercise.  Prescriptions: LORAZEPAM 0.5 MG TABS (LORAZEPAM) 1 q8h as needed anxiety  #90 x 2   Entered and Authorized by:   Gordy Savers  MD   Signed by:   Gordy Savers  MD on 07/25/2010   Method used:   Print then Give to Patient   RxID:   [id number] MELOXICAM 15 MG TABS (MELOXICAM) one daily  #90 x 6   Entered and Authorized by:   Gordy Savers  MD   Signed by:   Gordy Savers  MD on 07/25/2010   Method used:   Print then Give to Patient   RxID:   [id number] MELOXICAM 15 MG TABS (MELOXICAM) one daily  #90 x 6   Entered and Authorized by:   Gordy Savers  MD   Signed by:   Gordy Savers  MD on 07/25/2010   Method used:   Electronically to        [facility]  [address] [id number] (retail)       [address]       [place], [place]  [address]       Ph: [phone]       Fax: [phone]   RxID:   [id number]    Orders Added: 1)  Est. Patient Level IV [01601]

## 2010-08-16 NOTE — Progress Notes (Signed)
Summary: req call  Phone Note Call from Patient Call back at Home Phone [phone]   Call For: kwia/judy Reason for Call: Talk to Nurse, Talk to Doctor Summary of Call: Still having problems with cough & bladder problems.  Asks that Dr. Kirtland Bouchard call her himself or his nurse.   Initial call taken by: Rudy Jew, RN,  August 16, 2009 10:57 AM  Follow-up for Phone Call        Still has dysuria and coughing- can she have more abx? [facility] [address] Follow-up by: Raechel Ache, RN,  August 16, 2009 11:07 AM  Additional Follow-up for Phone Call Additional follow up Details #1::        Pt called to ck on status of her msg she lft earlier.... adv that she can be reached @ [phone] with any questions or concerns. Additional Follow-up by: Debbra Riding,  August 16, 2009 1:40 PM    Additional Follow-up for Phone Call Additional follow up Details #2::    Paient aware.  [facility] [address] Follow-up by: Rudy Jew, RN,  August 16, 2009 2:57 PM  New/Updated Medications: ZITHROMAX Z-PAK 250 MG TABS (AZITHROMYCIN) As directed. Prescriptions: ZITHROMAX Z-PAK 250 MG TABS (AZITHROMYCIN) As directed.  #1 x 0   Entered by:   Rudy Jew, RN   Authorized by:   Gordy Savers  MD   Signed by:   Rudy Jew, RN on 08/16/2009   Method used:   Electronically to        [facility] [id number]* (retail)       [address]       [place], [place]  [address]       Ph: [phone]       Fax: [phone]   RxID:   [id number]  Generic z pack

## 2010-08-16 NOTE — Assessment & Plan Note (Signed)
Summary: chest congestion/ surgical clearance/dm   Vital Signs:  Patient profile:   77 year old female Weight:      205 pounds Temp:     98.4 degrees F oral BP sitting:   130 / 72  (left arm) Cuff size:   regular  Vitals Entered By: Raechel Ache, RN (December 22, 2008 3:49 PM)  CC:  C/o productive cough and cong x 2 weeks. Fell at home and has bruises and skin tears. Needs med clearance for neck surgery.Marland Kitchen  History of Present Illness: 77 year old patient who has a history of coronary artery disease, hypertension, and cervical osteoarthritis.  For the past two weeks.  She has had chest congestion with cough that has been productive of yellow sputum.  No fever or shortness of breath.  She is considering surgery due to severe neck pain that is lifestyle limiting.  She remains symptomatic in spite of epidural injections, physical therapy and medical therapy. She is  status post  cardiac stenting. she remains on daily aspirin  but  Plavix has been discontinued.  A number of months ago. her cardiac status has been stable.  Problems Prior to Update: 1)  Pure Hypercholesterolemia  (ICD-272.0) 2)  Encounter For Therapeutic Drug Monitoring  (ICD-V58.83) 3)  Coronary Artery Disease  (ICD-414.00) 4)  Uri  (ICD-465.9) 5)  Headache  (ICD-784.0) 6)  Rlq Pain  (ICD-789.03) 7)  Anemia  (ICD-285.9) 8)  Degenerative Joint Disease  (ICD-715.90) 9)  Hiatal Hernia With Reflux  (ICD-553.3) 10)  Gerd  (ICD-530.81) 11)  Diverticulosis, Colon  (ICD-562.10) 12)  Colonic Polyps, Hx of  (ICD-V12.72) 13)  Hypertension  (ICD-401.9)  Allergies: No Known Drug Allergies  Past History:  Past Medical History: Reviewed history from 04/05/2008 and no changes required. Hypertension Advanced DJD Colonic polyps, hx of Diverticulosis, colon GERD w/esophageal strictures Hiatal hernia Coronary artery disease  Physical Exam  General:  overweight-appearing.  normal blood pressure; no distress Head:  Normocephalic  and atraumatic without obvious abnormalities. No apparent alopecia or balding. Eyes:  No corneal or conjunctival inflammation noted. EOMI. Perrla. Funduscopic exam benign, without hemorrhages, exudates or papilledema. Vision grossly normal. Ears:  External ear exam shows no significant lesions or deformities.  Otoscopic examination reveals clear canals, tympanic membranes are intact bilaterally without bulging, retraction, inflammation or discharge. Hearing is grossly normal bilaterally. Mouth:  Oral mucosa and oropharynx without lesions or exudates.  Teeth in good repair. Neck:  No deformities, masses, or tenderness noted. Lungs:  Normal respiratory effort, chest expands symmetrically. Lungs are clear to auscultation, no crackles or wheezes.  frequent paroxysms of cough; O2 saturation normal Heart:  Normal rate and regular rhythm. S1 and S2 normal without gallop, murmur, click, rub or other extra sounds. Abdomen:  Bowel sounds positive,abdomen soft and non-tender without masses, organomegaly or hernias noted. Msk:  No deformity or scoliosis noted of thoracic or lumbar spine.     Impression & Recommendations:  Problem # 1:  PURE HYPERCHOLESTEROLEMIA (ICD-272.0)  Her updated medication list for this problem includes:    Simvastatin 40 Mg Tabs (Simvastatin) .Marland Kitchen... 1 once daily  Her updated medication list for this problem includes:    Simvastatin 40 Mg Tabs (Simvastatin) .Marland Kitchen... 1 once daily  Problem # 2:  CORONARY ARTERY DISEASE (ICD-414.00)  Her updated medication list for this problem includes:    Benazepril Hcl 40 Mg Tabs (Benazepril hcl) ..... One by mouth daily    Bayer Aspirin 325 Mg Tabs (Aspirin) .Marland Kitchen... 1 once daily  Metoprolol Tartrate 50 Mg Tabs (Metoprolol tartrate) .Marland Kitchen... 1 two times a day    Nitrostat 0.4 Mg Subl (Nitroglycerin) ..... Use as needed    Spironolactone 25 Mg Tabs (Spironolactone) .Marland Kitchen... 1 once daily    Furosemide 40 Mg Tabs (Furosemide) ..... One daily, as needed  for fluid retention  Her updated medication list for this problem includes:    Benazepril Hcl 40 Mg Tabs (Benazepril hcl) ..... One by mouth daily    Bayer Aspirin 325 Mg Tabs (Aspirin) .Marland Kitchen... 1 once daily    Metoprolol Tartrate 50 Mg Tabs (Metoprolol tartrate) .Marland Kitchen... 1 two times a day    Nitrostat 0.4 Mg Subl (Nitroglycerin) ..... Use as needed    Spironolactone 25 Mg Tabs (Spironolactone) .Marland Kitchen... 1 once daily    Furosemide 40 Mg Tabs (Furosemide) ..... One daily, as needed for fluid retention  Problem # 3:  URI (ICD-465.9)  Her updated medication list for this problem includes:    Celebrex 200 Mg Caps (Celecoxib) ..... One by mouth daily    Bayer Aspirin 325 Mg Tabs (Aspirin) .Marland Kitchen... 1 once daily will treat with the expectorants, and a 7 day course of Avelox for acute bronchitis.  Prior to orthopedic surgery of her neck  a letter for medical clearance will be dictated  Her updated medication list for this problem includes:    Celebrex 200 Mg Caps (Celecoxib) ..... One by mouth daily    Bayer Aspirin 325 Mg Tabs (Aspirin) .Marland Kitchen... 1 once daily  Complete Medication List: 1)  Benazepril Hcl 40 Mg Tabs (Benazepril hcl) .... One by mouth daily 2)  Celebrex 200 Mg Caps (Celecoxib) .... One by mouth daily 3)  Lorazepam 0.5 Mg Tabs (Lorazepam) .Marland Kitchen.. 1 q8h as needed anxiety 4)  Premarin 0.625 Mg/gm Crea (Estrogens, conjugated) .... Use vaginally three times daily for 2 weeks, then two times weekly 5)  Prilosec 20 Mg Cpdr (Omeprazole) .... One by mouth daily 6)  Klor-con M20 20 Meq Tbcr (Potassium chloride crys cr) .... One by mouth daily 7)  Neurontin 600 Mg Tabs (Gabapentin) .... One tid 8)  Hydrocodone-acetaminophen 5-500 Mg Tabs (Hydrocodone-acetaminophen) .... One every 6 hs for pain 9)  Trazodone Hcl 100 Mg Tabs (Trazodone hcl) .... 1/2 at bedtime 10)  Bayer Aspirin 325 Mg Tabs (Aspirin) .Marland Kitchen.. 1 once daily 11)  Metoprolol Tartrate 50 Mg Tabs (Metoprolol tartrate) .Marland Kitchen.. 1 two times a day 12)   Simvastatin 40 Mg Tabs (Simvastatin) .Marland Kitchen.. 1 once daily 13)  Nitrostat 0.4 Mg Subl (Nitroglycerin) .... Use as needed 14)  Spironolactone 25 Mg Tabs (Spironolactone) .Marland Kitchen.. 1 once daily 15)  Furosemide 40 Mg Tabs (Furosemide) .... One daily, as needed for fluid retention  Patient Instructions: 1)  Get plenty of rest, drink lots of clear liquids, and use Tylenol or Ibuprofen for fever and comfort. Return in 7-10 days if you're not better:sooner if you're feeling worse.for the

## 2010-08-16 NOTE — Assessment & Plan Note (Signed)
   Vital Signs:  Patient Profile:   77 Years Old Female Weight:      201 pounds BP supine:   160 / 100 BP sitting:   142 / 82              plan is to 4 months for follow-up thank you  History of Present Illness: 77 year old female seen today for follow-up last few days.  She has had some lower abdominal pain with the dysuria and frequency.  She also has had a scanty yellowish vaginal discharge December complaint 6 weeks ago that responded to antibiotics as well as Flagyl.  No fever or other constitutional complaints        Physical Exam  Lungs:     Normal respiratory effort, chest expands symmetrically. Lungs are clear to auscultation, no crackles or wheezes. Heart:     Normal rate and regular rhythm. S1 and S2 normal without gallop, murmur, click, rub or other extra sounds. Abdomen:     obese mild lower abdominal discomfort    Impression & Recommendations:  Problem # 1:  dysuria UA revealed plus two, pyuria, will treat with Levaquin 750 daily for 3 days  Problem # 2:  vaginitis this was responsive to metronidazole in the past will retreat for 10 days  Problem # 3:  hypertension continue present regimen   Patient Instructions: 1)  Please schedule a follow-up appointment in 3 months.

## 2010-08-16 NOTE — Assessment & Plan Note (Signed)
Summary: 6 month rov/njr   Vital Signs:  Patient Profile:   77 Years Old Female Weight:      203 pounds Temp:     98.5 degrees F oral Pulse rate:   56 / minute Pulse rhythm:   regular BP sitting:   150 / 78  (left arm) Cuff size:   regular  Vitals Entered By: Raechel Ache, RN (April 05, 2008 10:50 AM)                Flu Vaccine Consent Questions     Do you have a history of severe allergic reactions to this vaccine? no    Any prior history of allergic reactions to egg and/or gelatin? no    Do you have a sensitivity to the preservative Thimersol? no    Do you have a past history of Guillan-Barre Syndrome? no    Do you currently have an acute febrile illness? no    Have you ever had a severe reaction to latex? no    Vaccine information given and explained to patient? yes    Are you currently pregnant? no    Lot Number:[id number]   Site Given  Left Deltoid IM   Chief Complaint:  ROV. Had cardiac stent placed in April. Also having L knee problem.[name]  History of Present Illness: 77 year old female seen today for follow-up.  She has a history of coronary artery disease, status post stenting in the spring of this year.  She has done quite well.  She has treated hypertension, and beta-blocker therapy has been added to her regimen of ACE inhibition and diuretic therapy.  Her cardiac status has been stable.  Her main complaint includes mainly left knee pain, but also low back pain.  She is followed closely by orthopedics    Current Allergies: No known allergies   Past Medical History:    Reviewed history from 01/21/2007 and no changes required:       Hypertension       Advanced DJD       Colonic polyps, hx of       Diverticulosis, colon       GERD w/esophageal strictures       Hiatal hernia       Coronary artery disease  Past Surgical History:    Reviewed history from 01/21/2007 and no changes required:       Appendectomy       Cholecystectomy       Hysterectomy    Lumbar laminectomy       Total knee replacement-bilateral; status post left total knee  replacement x 3       Rotator cuff repair       status post cardiac stents, April 2009   Family History:    Reviewed history from 05/19/2007 and no changes required:       father died age 54 and her heart condition       mother died age 74, heart failure, and significant osteoarthritis              Four brothers three half brothers Posicor R. disease with renal failure       three half sisters    Review of Systems       The patient complains of difficulty walking.  The patient denies anorexia, fever, weight loss, weight gain, vision loss, decreased hearing, hoarseness, chest pain, syncope, dyspnea on exertion, peripheral edema, prolonged cough, headaches, hemoptysis, abdominal pain, melena, hematochezia, severe indigestion/heartburn, hematuria, incontinence,  genital sores, muscle weakness, suspicious skin lesions, transient blindness, depression, unusual weight change, abnormal bleeding, enlarged lymph nodes, angioedema, and breast masses.     Physical Exam  General:     overweight-appearing.   Head:     Normocephalic and atraumatic without obvious abnormalities. No apparent alopecia or balding. Eyes:     No corneal or conjunctival inflammation noted. EOMI. Perrla. Funduscopic exam benign, without hemorrhages, exudates or papilledema. Vision grossly normal. Mouth:     Oral mucosa and oropharynx without lesions or exudates.  Teeth in good repair. Neck:     No deformities, masses, or tenderness noted. Lungs:     Normal respiratory effort, chest expands symmetrically. Lungs are clear to auscultation, no crackles or wheezes. Heart:     Normal rate and regular rhythm. S1 and S2 normal without gallop, murmur, click, rub or other extra sounds. Abdomen:     Bowel sounds positive,abdomen soft and non-tender without masses, organomegaly or hernias noted. Msk:     unable to fully extend, left  knee Pulses:     R and L carotid,radial,femoral,dorsalis pedis and posterior tibial pulses are full and equal bilaterally Extremities:     No clubbing, cyanosis, edema, or deformity noted with normal full range of motion of all joints.      Impression & Recommendations:  Problem # 1:  CORONARY ARTERY DISEASE (ICD-414.00)  Her updated medication list for this problem includes:    Benazepril Hcl 40 Mg Tabs (Benazepril hcl) ..... One by mouth daily    Hydrochlorothiazide 25 Mg Tabs (Hydrochlorothiazide) ..... One by mouth daily    Plavix 75 Mg Tabs (Clopidogrel bisulfate) .[name]... 1 once daily    Bayer Aspirin 325 Mg Tabs (Aspirin) .[name]... 1 once daily    Metoprolol Tartrate 50 Mg Tabs (Metoprolol tartrate) .[name]... 1 two times a day    Nitrostat 0.4 Mg Subl (Nitroglycerin) ..... Use as needed  Orders: Venipuncture (01027) TLB-AST (SGOT) (84450-SGOT) TLB-Cholesterol, Total (82465-CHO)   Problem # 2:  DEGENERATIVE JOINT DISEASE (ICD-715.90)  Her updated medication list for this problem includes:    Celebrex 200 Mg Caps (Celecoxib) ..... One by mouth daily    Hydrocodone-acetaminophen 5-500 Mg Tabs (Hydrocodone-acetaminophen) ..... One every 6 hs for pain    Propoxyphene N-apap 100-650 Mg Tabs (Propoxyphene n-apap) ..... One every 6 hours for pain    Bayer Aspirin 325 Mg Tabs (Aspirin) .[name]... 1 once daily   Problem # 3:  HYPERTENSION (ICD-401.9)  Her updated medication list for this problem includes:    Benazepril Hcl 40 Mg Tabs (Benazepril hcl) ..... One by mouth daily    Hydrochlorothiazide 25 Mg Tabs (Hydrochlorothiazide) ..... One by mouth daily    Metoprolol Tartrate 50 Mg Tabs (Metoprolol tartrate) .[name]... 1 two times a day   Problem # 4:  PURE HYPERCHOLESTEROLEMIA (ICD-272.0)  Her updated medication list for this problem includes:    Simvastatin 40 Mg Tabs (Simvastatin) .[name]... 1 once daily will check a SGOT and total cholesterol today  Complete Medication List: 1)  Benazepril  Hcl 40 Mg Tabs (Benazepril hcl) .... One by mouth daily 2)  Celebrex 200 Mg Caps (Celecoxib) .... One by mouth daily 3)  Lorazepam 0.5 Mg Tabs (Lorazepam) .[name].. 1 q8h as needed anxiety 4)  Premarin 0.625 Mg/gm Crea (Estrogens, conjugated) .... Use vaginally three times daily for 2 weeks, then two times weekly 5)  Hydrochlorothiazide 25 Mg Tabs (Hydrochlorothiazide) .... One by mouth daily 6)  Prilosec 20 Mg Cpdr (Omeprazole) .... One by mouth  daily 7)  Klor-con M20 20 Meq Tbcr (Potassium chloride crys cr) .... One by mouth daily 8)  Neurontin 600 Mg Tabs (Gabapentin) .... One tid 9)  Metoclopramide Hcl 10 Mg Tabs (Metoclopramide hcl) .... One daily before evening meal 10)  Hydrocodone-acetaminophen 5-500 Mg Tabs (Hydrocodone-acetaminophen) .... One every 6 hs for pain 11)  Trazodone Hcl 100 Mg Tabs (Trazodone hcl) .... 1/2 at bedtime 12)  Nu-iron 150 Mg Caps (Polysaccharide iron complex) .[name].. 1 once daily 13)  Propoxyphene N-apap 100-650 Mg Tabs (Propoxyphene n-apap) .... One every 6 hours for pain 14)  Plavix 75 Mg Tabs (Clopidogrel bisulfate) .[name].. 1 once daily 15)  Bayer Aspirin 325 Mg Tabs (Aspirin) .[name].. 1 once daily 16)  Metoprolol Tartrate 50 Mg Tabs (Metoprolol tartrate) .[name].. 1 two times a day 17)  Simvastatin 40 Mg Tabs (Simvastatin) .[name].. 1 once daily 18)  Nitrostat 0.4 Mg Subl (Nitroglycerin) .... Use as needed  Other Orders: Flu Vaccine 68yrs + (09323) Admin of Therapeutic Inj (IM or ) (55732)   Patient Instructions: 1)  Please schedule a follow-up appointment in 6 months. 2)  Limit your Sodium (Salt) to less than 2 grams a day(slightly less than 1/2 a teaspoon) to prevent fluid retention, swelling, or worsening of symptoms. 3)  You need to lose weight. Consider a lower calorie diet and regular exercise.  4)  attempt to minimize and discontinue metoclopamide   Prescriptions: NITROSTAT 0.4 MG SUBL (NITROGLYCERIN) use as needed  #bottle x 0   Entered and Authorized by:   Gordy Savers  MD   Signed by:   Gordy Savers  MD on 04/05/2008   Method used:   Print then Give to Patient   RxID:   [id number] SIMVASTATIN 40 MG TABS (SIMVASTATIN) 1 once daily  #90 x 6   Entered and Authorized by:   Gordy Savers  MD   Signed by:   Gordy Savers  MD on 04/05/2008   Method used:   Print then Give to Patient   RxID:   [id number] METOPROLOL TARTRATE 50 MG TABS (METOPROLOL TARTRATE) 1 two times a day  #180 x 6   Entered and Authorized by:   Gordy Savers  MD   Signed by:   Gordy Savers  MD on 04/05/2008   Method used:   Print then Give to Patient   RxID:   [id number] PLAVIX 75 MG TABS (CLOPIDOGREL BISULFATE) 1 once daily  #90 x 6   Entered and Authorized by:   Gordy Savers  MD   Signed by:   Gordy Savers  MD on 04/05/2008   Method used:   Print then Give to Patient   RxID:   [id number] TRAZODONE HCL 100 MG  TABS (TRAZODONE HCL) 1/2 at bedtime  #90 x 6   Entered and Authorized by:   Gordy Savers  MD   Signed by:   Gordy Savers  MD on 04/05/2008   Method used:   Print then Give to Patient   RxID:   [id number] HYDROCODONE-ACETAMINOPHEN 5-500 MG  TABS (HYDROCODONE-ACETAMINOPHEN) one every 6 hs for pain  #90 x 6   Entered and Authorized by:   Gordy Savers  MD   Signed by:   Gordy Savers  MD on 04/05/2008   Method used:   Print then Give to Patient   RxID:   [id number] METOCLOPRAMIDE HCL 10 MG  TABS (METOCLOPRAMIDE HCL) one daily before evening meaL  #  90 x 6   Entered and Authorized by:   Gordy Savers  MD   Signed by:   Gordy Savers  MD on 04/05/2008   Method used:   Print then Give to Patient   RxID:   [id number] NEURONTIN 600 MG  TABS (GABAPENTIN) one tid  #270 x 6   Entered and Authorized by:   Gordy Savers  MD   Signed by:   Gordy Savers  MD on 04/05/2008   Method used:   Print then Give to Patient   RxID:    [id number] KLOR-CON M20 20 MEQ  TBCR (POTASSIUM CHLORIDE CRYS CR) one by mouth daily  #90 x 6   Entered and Authorized by:   Gordy Savers  MD   Signed by:   Gordy Savers  MD on 04/05/2008   Method used:   Print then Give to Patient   RxID:   [id number] PRILOSEC 20 MG  CPDR (OMEPRAZOLE) one by mouth daily  #90 x 6   Entered and Authorized by:   Gordy Savers  MD   Signed by:   Gordy Savers  MD on 04/05/2008   Method used:   Print then Give to Patient   RxID:   [id number] HYDROCHLOROTHIAZIDE 25 MG  TABS (HYDROCHLOROTHIAZIDE) one by mouth daily  #90 x 6   Entered and Authorized by:   Gordy Savers  MD   Signed by:   Gordy Savers  MD on 04/05/2008   Method used:   Print then Give to Patient   RxID:   [id number] LORAZEPAM 0.5 MG TABS (LORAZEPAM) 1 q8h as needed anxiety  #60 x 4   Entered and Authorized by:   Gordy Savers  MD   Signed by:   Gordy Savers  MD on 04/05/2008   Method used:   Print then Give to Patient   RxID:   [id number] CELEBREX 200 MG CAPS (CELECOXIB) one by mouth daily  #90 x 6   Entered and Authorized by:   Gordy Savers  MD   Signed by:   Gordy Savers  MD on 04/05/2008   Method used:   Print then Give to Patient   RxID:   [id number] BENAZEPRIL HCL 40 MG TABS (BENAZEPRIL HCL) one by mouth daily  #90 x 6   Entered and Authorized by:   Gordy Savers  MD   Signed by:   Gordy Savers  MD on 04/05/2008   Method used:   Print then Give to Patient   RxID:   [id number]  ]

## 2010-08-16 NOTE — Miscellaneous (Signed)
Summary: Controlled Substances Contract  Controlled Substances Contract   Imported By: Maryln Gottron 02/08/2010 11:10:35  _____________________________________________________________________  External Attachment:    Type:   Image     Comment:   External Document

## 2010-08-16 NOTE — Progress Notes (Signed)
Summary: CELEBREX APPROVED FOR 1 YR BY [facility]   Phone Note Other Incoming   Call placed by: Joette Catching [id number]  Summary of Call: [facility] APPROVED HER CELEBREX FOR ONE YEAR STARTING 11-12-07 Initial call taken by: Roselle Locus,  November 12, 2007 10:04 AM

## 2010-08-16 NOTE — Assessment & Plan Note (Signed)
Summary: FU FROM [facility]   Vital Signs:  Patient Profile:   77 Years Old [name] Weight:      201 pounds Temp:     98.6 degrees F oral BP sitting:   168 / 90  (left arm)  Vitals Entered By: Raechel Ache, RN (June 15, 2007 2:43 PM)                 Chief Complaint:  F/u urgent care on Sat. C/o abd pain, nausea, headache. Vomited Fri & Sat, and Anemic; hem 9.3. On Cipro and Flagyl.Marland Kitchen  History of Present Illness: [name] seen today for follow-up. she is evaluated at the ED over the weekend for lower abdominal pain.  She is improved since the been started on antibiotic therapy for diverticulitis. There have moderate anemia to hemoglobin of 9.3.  She did have full GI valuation less than one year ago with upper and lower endoscopy.  This is unremarkable except for diverticulosis in and he and a       Physical Exam  General:     Well-developed,well-nourished,in no acute distress; alert,appropriate and cooperative throughout examination Mouth:     Oral mucosa and oropharynx without lesions or exudates.  Teeth in good repair. Neck:     No deformities, masses, or tenderness noted. Lungs:     Normal respiratory effort, chest expands symmetrically. Lungs are clear to auscultation, no crackles or wheezes. Heart:     Normal rate and regular rhythm. S1 and S2 normal without gallop, murmur, click, rub or other extra sounds. Abdomen:     lower abdominal pain in the left lower quadrant and lower midline.  No guarding.  Bowel sounds active    Impression & Recommendations:  Problem # 1:  DIVERTICULOSIS, COLON (ICD-562.10)  Problem # 2:  HYPERTENSION (ICD-401.9)  Her updated medication list for this problem includes:    Benazepril Hcl 40 Mg Tabs (Benazepril hcl) ..... One by mouth daily    Hydrochlorothiazide 25 Mg Tabs (Hydrochlorothiazide) ..... One by mouth daily   Complete Medication List: 1)  Benazepril Hcl 40 Mg Tabs (Benazepril hcl) ....  One by mouth daily 2)  Celebrex 200 Mg Caps (Celecoxib) .... One by mouth daily 3)  Lorazepam 0.5 Mg Tabs (Lorazepam) 4)  Premarin 0.625 Mg/gm Crea (Estrogens, conjugated) .... Use vaginally three times daily for 2 weeks, then two times weekly 5)  Hydrochlorothiazide 25 Mg Tabs (Hydrochlorothiazide) .... One by mouth daily 6)  Prilosec 20 Mg Cpdr (Omeprazole) .... One by mouth daily 7)  Klor-con M20 20 Meq Tbcr (Potassium chloride crys cr) .... One by mouth daily 8)  Neurontin 600 Mg Tabs (Gabapentin) .... One tid 9)  Metoclopramide Hcl 10 Mg Tabs (Metoclopramide hcl) .... One daily before evening meal 10)  Hydrocodone-acetaminophen 5-500 Mg Tabs (Hydrocodone-acetaminophen) .... One every 6 hs for pain 11)  Trazodone Hcl 100 Mg Tabs (Trazodone hcl) .... 1/2 at bedtime   Patient Instructions: 1)  Please schedule a follow-up appointment in 2 weeks 2)   take iron daily 3)  Please schedule a follow-up appointment in 2 weeks. 4)  Limit your Sodium (Salt) to less than 4 grams a day (slightly less than 1 teaspoon) to prevent fluid retention, swelling, or worsening or symptoms.    ]

## 2010-08-16 NOTE — Consult Note (Signed)
Summary: [facility]   Imported By: Maryln Gottron 03/02/2008 12:15:34  _____________________________________________________________________  External Attachment:    Type:   Image     Comment:   External Document

## 2010-09-03 ENCOUNTER — Encounter: Payer: Self-pay | Admitting: Internal Medicine

## 2010-09-03 ENCOUNTER — Ambulatory Visit (INDEPENDENT_AMBULATORY_CARE_PROVIDER_SITE_OTHER): Payer: Medicare PPO | Admitting: Internal Medicine

## 2010-09-03 DIAGNOSIS — J209 Acute bronchitis, unspecified: Secondary | ICD-10-CM

## 2010-09-03 DIAGNOSIS — M1990 Unspecified osteoarthritis, unspecified site: Secondary | ICD-10-CM

## 2010-09-03 DIAGNOSIS — R0602 Shortness of breath: Secondary | ICD-10-CM

## 2010-09-03 DIAGNOSIS — I10 Essential (primary) hypertension: Secondary | ICD-10-CM

## 2010-09-03 MED ORDER — AZITHROMYCIN 250 MG PO TABS: ORAL_TABLET | ORAL | Status: DC

## 2010-09-03 NOTE — Patient Instructions (Signed)
Get plenty of rest, Drink lots of  clear liquids, and use Tylenol or ibuprofen for fever and discomfort.    Take your antibiotic as prescribed until ALL of it is gone, but stop if you develop a rash, swelling, or any side effects of the medication.  Contact our office as soon as possible if  there are side effects of the medication.  Call or return to clinic prn if these symptoms worsen or fail to improve as anticipated.   

## 2010-09-03 NOTE — Progress Notes (Signed)
  Subjective:    Patient ID: [name], female    DOB: June 09, 1930, 77 y.o.   MRN: [id number]  HPI 77 year old patient who is seen today for follow-up.  She has a history of CAD diastolic heart failure, as well as treated hypertension.  For the past week.  She has had worsening chest congestion and cough.  She has had some associated wheezing.  Cough has been described as worse at night and productive of yellow to green sputum.  She feels unwell with fever.  Denies any chest pain or severe shortness of breath.  There's been no worsening peripheral edema She also  has a history of severe arthritis and is status post bilateral total knee replacement surgeries.  She has [place]ren worsening right knee pain. She is requesting orthopedic referral Review of Systems  Constitutional: Positive for fever and fatigue.  HENT: Positive for congestion. Negative for hearing loss, sore throat, rhinorrhea, dental problem, sinus pressure and tinnitus.   Eyes: Negative for pain, discharge and visual disturbance.  Respiratory: Positive for cough. Negative for shortness of breath.   Cardiovascular: Negative for chest pain, palpitations and leg swelling.  Gastrointestinal: Negative for nausea, vomiting, abdominal pain, diarrhea, constipation, blood in stool and abdominal distention.  Genitourinary: Negative for dysuria, urgency, frequency, hematuria, flank pain, vaginal bleeding, vaginal discharge, difficulty urinating, vaginal pain and pelvic pain.  Musculoskeletal: Positive for joint swelling, arthralgias and gait problem.  Skin: Negative for rash.  Neurological: Negative for dizziness, syncope, speech difficulty, weakness, numbness and headaches.  Hematological: Negative for adenopathy.  Psychiatric/Behavioral: Negative for behavioral problems, dysphoric mood and agitation. The patient is not nervous/anxious.        Objective:   Physical Exam  Constitutional: She is oriented to person, place, and time. She  appears well-developed and well-nourished. No distress.       Obese  Blood  pressure 120/70  HENT:  Head: Normocephalic.  Right Ear: External ear normal.  Left Ear: External ear normal.  Mouth/Throat: Oropharynx is clear and moist.  Eyes: Conjunctivae and EOM are normal. Pupils are equal, round, and reactive to light.  Neck: Normal range of motion. Neck supple. No thyromegaly present.  Cardiovascular: Normal rate, regular rhythm, normal heart sounds and intact distal pulses.   Pulmonary/Chest: Effort normal.       Scattered coarse rhonchi  Abdominal: Soft. Bowel sounds are normal. She exhibits no mass. There is no tenderness.  Musculoskeletal: Normal range of motion.       Both knees were examined.  Status post bilateral total knee   replacement surgeries with surgical scars.  The right knee was swollen and slightly painful to touch; no excess of one  Lymphadenopathy:    She has no cervical adenopathy.  Neurological: She is alert and oriented to person, place, and time.  Skin: Skin is warm and dry. No rash noted.  Psychiatric: She has a normal mood and affect. Her behavior is normal.          Assessment & Plan:  Acute bronchitis Hypertension History of diastolic heart failure Significant knee osteoarthritis  Will treat with expectorants  As well as  Azithromycin.  Will set up for orthopedic referral

## 2010-09-17 ENCOUNTER — Ambulatory Visit (INDEPENDENT_AMBULATORY_CARE_PROVIDER_SITE_OTHER): Payer: Medicare PPO | Admitting: Internal Medicine

## 2010-09-17 ENCOUNTER — Encounter: Payer: Self-pay | Admitting: Internal Medicine

## 2010-09-17 VITALS — BP 110/68 | Temp 97.8°F | Wt 185.0 lb

## 2010-09-17 DIAGNOSIS — K529 Noninfective gastroenteritis and colitis, unspecified: Secondary | ICD-10-CM

## 2010-09-17 DIAGNOSIS — I10 Essential (primary) hypertension: Secondary | ICD-10-CM

## 2010-09-17 DIAGNOSIS — R112 Nausea with vomiting, unspecified: Secondary | ICD-10-CM

## 2010-09-17 DIAGNOSIS — K5289 Other specified noninfective gastroenteritis and colitis: Secondary | ICD-10-CM

## 2010-09-17 MED ORDER — HYDROCODONE-ACETAMINOPHEN 5-500 MG PO TABS: 1.0000 | ORAL_TABLET | Freq: Four times a day (QID) | ORAL | Status: DC | PRN

## 2010-09-17 MED ORDER — CLOTRIMAZOLE-BETAMETHASONE 1-0.05 % EX CREA: TOPICAL_CREAM | Freq: Two times a day (BID) | CUTANEOUS | Status: AC

## 2010-09-17 MED ORDER — DIPHENOXYLATE-ATROPINE 2.5-0.025 MG/5ML PO LIQD: 5.0000 mL | Freq: Four times a day (QID) | ORAL | Status: AC | PRN

## 2010-09-17 MED ORDER — PROMETHAZINE HCL 50 MG/ML IJ SOLN
50.0000 mg | INTRAMUSCULAR | Status: AC
  Administered 2010-09-17: 50 mg via INTRAMUSCULAR

## 2010-09-17 MED ORDER — PROMETHAZINE HCL 12.5 MG PO TABS: 12.5000 mg | ORAL_TABLET | Freq: Four times a day (QID) | ORAL | Status: DC | PRN

## 2010-09-17 NOTE — Progress Notes (Signed)
  Subjective:    Patient ID: [name], female    DOB: Jul 09, 1930, 77 y.o.   MRN: [id number]  HPI 77 year old patient who presents with a five-day history of nausea vomiting and diarrhea. She had 2 loose bowel movements  Since she awoke this morning but had no diarrhea through the night. She describes moderate cramping abdominal pain and nausea.   Denies any fever or chills. She has been on ACE inhibition and when necessary diuretics due to the history of hypertension and diastolic heart failure. Denies any pre-syncopal symptoms    Review of Systems  Constitutional: Negative.   HENT: Negative for hearing loss, congestion, sore throat, rhinorrhea, dental problem, sinus pressure and tinnitus.   Eyes: Negative for pain, discharge and visual disturbance.  Respiratory: Negative for cough and shortness of breath.   Cardiovascular: Negative for chest pain, palpitations and leg swelling.  Gastrointestinal: Positive for nausea, vomiting and diarrhea. Negative for abdominal pain, constipation, blood in stool, abdominal distention and rectal pain.  Genitourinary: Negative for dysuria, urgency, frequency, hematuria, flank pain, vaginal bleeding, vaginal discharge, difficulty urinating, vaginal pain and pelvic pain.  Musculoskeletal: Negative for joint swelling, arthralgias and gait problem.  Skin: Negative for rash.  Neurological: Negative for dizziness, syncope, speech difficulty, weakness, numbness and headaches.  Hematological: Negative for adenopathy.  Psychiatric/Behavioral: Negative for behavioral problems, dysphoric mood and agitation. The patient is not nervous/anxious.        Objective:   Physical Exam  Constitutional: She is oriented to person, place, and time. She appears well-developed and well-nourished. No distress.  HENT:  Head: Normocephalic.  Right Ear: External ear normal.  Left Ear: External ear normal.  Mouth/Throat: Oropharynx is clear and moist.  Eyes: Conjunctivae and EOM  are normal. Pupils are equal, round, and reactive to light.  Neck: Normal range of motion. Neck supple. No thyromegaly present.  Cardiovascular: Normal rate, regular rhythm, normal heart sounds and intact distal pulses.   Pulmonary/Chest: Effort normal and breath sounds normal.  Abdominal: Soft. Bowel sounds are normal. She exhibits no mass. There is no tenderness. There is no rebound and no guarding.  Musculoskeletal: Normal range of motion.  Lymphadenopathy:    She has no cervical adenopathy.  Neurological: She is alert and oriented to person, place, and time.  Skin: Skin is warm and dry. No rash noted.  Psychiatric: She has a normal mood and affect. Her behavior is normal.          Assessment & Plan:   viral gastroenteritis. We'll place on a clear liquid diet and advance diet as tolerated. We'll treat with anti-emetics. Until her symptoms have resolved based inhibition and diuretic therapy will be held.

## 2010-09-17 NOTE — Patient Instructions (Signed)
Drink clear liquids only for the next 24 hours,  then slowly add other liquids and food as you  tolerate them The main concern with gastroenteritis is dehydration.  Drink  plenty of  fluids, and advance your diet  slowly to solids as you feel improved.  If you are unable to keep anything down or  Show  signs of dehydration such as dry, cracked lips, or  not urinating, please notify our office.    do not take furosemide or benazepril  Until your diet has normalized  Call or return to clinic prn if these symptoms worsen or fail to improve as anticipated.

## 2010-10-22 LAB — PROTIME-INR
INR: 1.1 (ref 0.00–1.49)
Prothrombin Time: 14.7 seconds (ref 11.6–15.2)

## 2010-10-22 LAB — CBC
HCT: 39.1 % (ref 36.0–46.0)
Hemoglobin: 13.4 g/dL (ref 12.0–15.0)
MCHC: 34.2 g/dL (ref 30.0–36.0)
MCV: 91.8 fL (ref 78.0–100.0)
Platelets: 147 10*3/uL — ABNORMAL LOW (ref 150–400)
RBC: 4.26 MIL/uL (ref 3.87–5.11)
RDW: 13.6 % (ref 11.5–15.5)
WBC: 8.3 10*3/uL (ref 4.0–10.5)

## 2010-10-22 LAB — COMPREHENSIVE METABOLIC PANEL
ALT: 18 U/L (ref 0–35)
AST: 26 U/L (ref 0–37)
Albumin: 4.2 g/dL (ref 3.5–5.2)
Alkaline Phosphatase: 66 U/L (ref 39–117)
BUN: 23 mg/dL (ref 6–23)
CO2: 28 mEq/L (ref 19–32)
Calcium: 9.5 mg/dL (ref 8.4–10.5)
Chloride: 105 mEq/L (ref 96–112)
Creatinine, Ser: 1.26 mg/dL — ABNORMAL HIGH (ref 0.4–1.2)
GFR calc Af Amer: 50 mL/min — ABNORMAL LOW (ref >60)
GFR calc non Af Amer: 41 mL/min — ABNORMAL LOW (ref >60)
Glucose, Bld: 89 mg/dL (ref 70–99)
Potassium: 4.7 mEq/L (ref 3.5–5.1)
Sodium: 137 mEq/L (ref 135–145)
Total Bilirubin: 0.5 mg/dL (ref 0.3–1.2)
Total Protein: 7.2 g/dL (ref 6.0–8.3)

## 2010-10-22 LAB — DIFFERENTIAL
Basophils Absolute: 0 10*3/uL (ref 0.0–0.1)
Basophils Relative: 0 % (ref 0–1)
Eosinophils Absolute: 0.1 10*3/uL (ref 0.0–0.7)
Eosinophils Relative: 2 % (ref 0–5)
Lymphocytes Relative: 25 % (ref 12–46)
Lymphs Abs: 2 10*3/uL (ref 0.7–4.0)
Monocytes Absolute: 0.6 10*3/uL (ref 0.1–1.0)
Monocytes Relative: 8 % (ref 3–12)
Neutro Abs: 5.5 10*3/uL (ref 1.7–7.7)
Neutrophils Relative %: 66 % (ref 43–77)

## 2010-10-22 LAB — URINALYSIS, ROUTINE W REFLEX MICROSCOPIC
Bilirubin Urine: NEGATIVE
Glucose, UA: NEGATIVE mg/dL
Hgb urine dipstick: NEGATIVE
Ketones, ur: NEGATIVE mg/dL
Nitrite: NEGATIVE
Protein, ur: NEGATIVE mg/dL
Specific Gravity, Urine: 1.019 (ref 1.005–1.030)
Urobilinogen, UA: 0.2 mg/dL (ref 0.0–1.0)
pH: 5.5 (ref 5.0–8.0)

## 2010-11-19 ENCOUNTER — Ambulatory Visit (INDEPENDENT_AMBULATORY_CARE_PROVIDER_SITE_OTHER): Payer: Medicare PPO | Admitting: Internal Medicine

## 2010-11-19 ENCOUNTER — Encounter: Payer: Self-pay | Admitting: Internal Medicine

## 2010-11-19 DIAGNOSIS — IMO0002 Reserved for concepts with insufficient information to code with codable children: Secondary | ICD-10-CM

## 2010-11-19 DIAGNOSIS — L03119 Cellulitis of unspecified part of limb: Secondary | ICD-10-CM

## 2010-11-19 DIAGNOSIS — Z0000 Encounter for general adult medical examination without abnormal findings: Secondary | ICD-10-CM

## 2010-11-19 DIAGNOSIS — Z23 Encounter for immunization: Secondary | ICD-10-CM

## 2010-11-19 DIAGNOSIS — M1990 Unspecified osteoarthritis, unspecified site: Secondary | ICD-10-CM

## 2010-11-19 DIAGNOSIS — L02419 Cutaneous abscess of limb, unspecified: Secondary | ICD-10-CM

## 2010-11-19 DIAGNOSIS — I5032 Chronic diastolic (congestive) heart failure: Secondary | ICD-10-CM

## 2010-11-19 DIAGNOSIS — I10 Essential (primary) hypertension: Secondary | ICD-10-CM

## 2010-11-19 DIAGNOSIS — X58XXXA Exposure to other specified factors, initial encounter: Secondary | ICD-10-CM

## 2010-11-19 DIAGNOSIS — T148XXA Other injury of unspecified body region, initial encounter: Secondary | ICD-10-CM

## 2010-11-19 DIAGNOSIS — I509 Heart failure, unspecified: Secondary | ICD-10-CM

## 2010-11-19 MED ORDER — CEPHALEXIN 500 MG PO CAPS: 500.0000 mg | ORAL_CAPSULE | Freq: Four times a day (QID) | ORAL | Status: AC

## 2010-11-19 NOTE — Progress Notes (Signed)
  Subjective:    Patient ID: [name], female    DOB: 11/19/1929, 77 y.o.   MRN: [id number]  HPI  77 year old patient who is seen today for followup. Her chief concern is a laceration involving her right calf area. She sustained a laceration from trauma from her car door a few weeks ago. There's been some ongoing persistent discomfort and nonhealing. There's been no drainage fever or other systemic complaints. She has significant difficulty with both knees. She [place]. Her local orthopedist who has performed bilateral knee replacement surgeries no longer takes her insurance. She has hypertension and a history of diastolic heart failure. No symptoms of heart failure. No peripheral edema. She has treated hypertension and coronary artery disease which has been stable. No exertional chest pain.    Review of Systems  Constitutional: Negative.   HENT: Negative for hearing loss, congestion, sore throat, rhinorrhea, dental problem, sinus pressure and tinnitus.   Eyes: Negative for pain, discharge and visual disturbance.  Respiratory: Negative for cough and shortness of breath.   Cardiovascular: Negative for chest pain, palpitations and leg swelling.  Gastrointestinal: Negative for nausea, vomiting, abdominal pain, diarrhea, constipation, blood in stool and abdominal distention.  Genitourinary: Negative for dysuria, urgency, frequency, hematuria, flank pain, vaginal bleeding, vaginal discharge, difficulty urinating, vaginal pain and pelvic pain.  Musculoskeletal: Negative for joint swelling, arthralgias and gait problem.  Skin: Positive for wound. Negative for rash.  Neurological: Negative for dizziness, syncope, speech difficulty, weakness, numbness and headaches.  Hematological: Negative for adenopathy.  Psychiatric/Behavioral: Negative for behavioral problems, dysphoric mood and agitation. The patient is not nervous/anxious.        Objective:   Physical Exam    Constitutional: She is oriented to person, place, and time. She appears well-developed and well-nourished.  HENT:  Head: Normocephalic.  Right Ear: External ear normal.  Left Ear: External ear normal.  Mouth/Throat: Oropharynx is clear and moist.  Eyes: Conjunctivae and EOM are normal. Pupils are equal, round, and reactive to light.  Neck: Normal range of motion. Neck supple. No thyromegaly present.  Cardiovascular: Normal rate, regular rhythm, normal heart sounds and intact distal pulses.   Pulmonary/Chest: Effort normal and breath sounds normal.  Abdominal: Soft. Bowel sounds are normal. She exhibits no mass. There is no tenderness.  Musculoskeletal: Normal range of motion.  Lymphadenopathy:    She has no cervical adenopathy.  Neurological: She is alert and oriented to person, place, and time.  Skin: Skin is warm and dry. No rash noted.       A superficial laceration and a deeper puncture wound noted involving her right medial calf; there is no exudate expressed nor any surrounding erythema  Psychiatric: She has a normal mood and affect. Her behavior is normal.          Assessment & Plan:   Wound infection left leg-  will treat with Keflex for 7 days Diastolic heart failure stable Hypertension stable Osteoarthritis

## 2010-11-19 NOTE — Patient Instructions (Signed)
Take your antibiotic as prescribed until ALL of it is gone, but stop if you develop a rash, swelling, or any side effects of the medication.  Contact our office as soon as possible if  there are side effects of the medication.  Limit your sodium (Salt) intake  Return in 3 months for follow-up

## 2010-11-27 NOTE — Cardiovascular Report (Signed)
[name], [name] NO.:  [id number]   MEDICAL RECORD NO.:  [id number]          PATIENT TYPE:  OIB   LOCATION:  1965                         FACILITY:  [facility]   PHYSICIAN:  [name], MDDATE OF BIRTH:  [birth year][place]aggio-1931   DATE OF PROCEDURE:  DATE OF DISCHARGE:  11/02/2007                            CARDIAC CATHETERIZATION   PRIMARY CARE PHYSICIAN:  Whitney P. Merla Riches, MD.   PATIENT IDENTIFICATION:  [name] is a delightful 77 year old woman with  a history of hypertension, obesity, and diastolic heart failure who was  seen in the office for chest pain and shortness of breath very  concerning for angina.  She is referred for diagnostic catheterization  and this was performed in the outpatient catheterization laboratory.   PROCEDURES PERFORMED:  1. Selective coronary angiography.  2. Left heart catheterization.  3. Left ventriculogram.   DESCRIPTION OF PROCEDURE:  The risks and indication of the  catheterization were explained.  Consent was signed and placed in the  chart.  A 4-French arterial sheath was placed in the right femoral  artery using a modified Seldinger technique.  Standard catheters  including JL4, 3-DRC and angled pigtail were used for catheterization.  All catheter exchanges were made over wire.  There were no apparent  complications.   Central aortic pressure 156/74 with a mean of 109, LV pressure 156/59  with an EDP of 23.  There is no aortic stenosis.   Coronary anatomy.   Left main was normal.   LAD was a long vessel wrapping the apex.  It gave off a fairly large  diagonal branch in the proximal portion.  There was calcium throughout  the proximal and mid LAD.   Throughout the proximal and mid LAD, there was a diffuse 40% stenosis.  This led into the ostium of the diagonal.  The remainder of the LAD was  a large vessel which wrapped the apex and which has had minor luminal  irregularities.  In the mid portion of the large  diagonal, there was  approximately a 70-75% focal stenosis.   Left circumflex was a moderate-sized artery gave off three moderate  sized marginal branches.  There was a 30% lesion in the proximal  circumflex and a 50% lesion in the mid AV groove circ which was  associated with some moderate calcification.   Right coronary artery was a large dominant vessel, gave off RV branch,  large PDA and two posterolaterals.  There was a 90% tubular lesion in  the mid section spanning the takeoff of the RV branch.   Left ventriculogram was done in RAO position showed an EF of 60% with no  regional wall motion abnormalities.   ASSESSMENT:  1. Two-vessel coronary artery disease with a high-grade lesion in the      right carotid artery and a borderline lesion in the diagonal.  2. Normal left ventricular function with elevated filling pressures.   PLAN:  I suspect her symptoms are coming from the RCA.  I will review  with Dr. Excell [name].  We will plan for a percutaneous intervention on RCA.  I suspect the diagonal will do best with medical therapy but as it is a  large vessel, I will review with Dr. Excell [name] regarding options.      Whitney Buckles. Bensimhon, MD  Electronically Signed     DRB/MEDQ  D:  11/02/2007  T:  11/03/2007  Job:  [id number]   cc:   [name], M.D.

## 2010-11-27 NOTE — Assessment & Plan Note (Signed)
[facility]                            CARDIOLOGY OFFICE NOTE   TANEA, MOGA                         MRN:          [id number]  DATE:11/17/2007                            DOB:          [birth year]    PRIMARY CARE PHYSICIAN:  Molly Maduro P. Merla Riches, M.D.   INTERVAL HISTORY:  Ms. [name] is a very pleasant 77 year old woman with a  history of hypertension, obesity, diastolic heart failure, and  hyperlipidemia, whom we saw recently in the office with concerns of  progressive angina.  She underwent catheterization which showed normal  LV function, a borderline lesion in the diagonal, very high-grade lesion  in the right coronary artery.  She underwent angioplasty with a bare  metal stent by Dr. Excell Seltzer.  This was complicated by an edge dissection  which was also stented.  Since the catheterization, she has been doing  well.  She feels her breathing is much better.  She has not had any  chest pain, and no problems with her groin site.  She has been taking  her Plavix.  She has been complaining of pain in her bones and joints.  She has had a history of marked osteoarthritis.  I am not sure if it is  related to this on her Zocor.  She has been taking Celebrex  intermittently.   CURRENT MEDICATIONS:  1. Lasix 40 a day.  2. Nexium 40 a day.  3. Reglan 10 at bedtime.  4. Potassium 20 mEq a day.  5. Celebrex 200 mg a day.  6. Benicar 40 a day.  7. Trazodone 50 a day.  8. Zocor 40 a day.  9. Metoprolol 50 b.i.d.  10.Aspirin 325 a day.  11.Plavix 75 a day.   PHYSICAL EXAMINATION:  GENERAL:  She is an elderly woman, in no acute  distress.  She ambulates around the clinic without any respiratory  difficulty.  VITAL SIGNS:  Blood pressure is 130/74, heart rate 70.  HEENT:  Normal.  NECK:  Supple.  There is no JVD.  Carotid are 2+ bilaterally, without  bruits.  There is no lymphadenopathy or thyromegaly.  CARDIAC:  PMI is nondisplaced.  She is regular, with no  murmurs, rubs,  or gallops.  LUNGS:  Clear.  ABDOMEN:  Obese, nontender, nondistended.  No hepatosplenomegaly, no  bruits, no masses.  Good bowel sounds.  EXTREMITIES:  Warm, with no cyanosis, clubbing, or edema.  Right groin  has a small hematoma, but no bruit.  No rash.  NEUROLOGIC:  Alert and oriented x3.  Cranial nerves II-XII are intact.  Moves all four extremities without difficulty.  Affect is pleasant.   ASSESSMENT AND PLAN:  1. Coronary artery disease, status post recent stenting of the right      coronary artery with bare metal stent.  Per guidelines, she needs      at least 1 month of Plavix, but if possible I would like her to      stay on for at least a year.  She is otherwise doing well.  We have  referred her for cardiac rehab.  2. Muscle aches.  I think this is mostly due to her osteoarthritis,      though I will get a CK and a sed rate to follow up.  3. Hypertension.  Followed by Dr. Merla Riches.  4. Hyperlipidemia.  Followed by Dr. Merla Riches.  Goal LDL is less than      70.   DISPOSITION:  We will see her back in 4 months for routine followup.     Bevelyn Buckles. Bensimhon, MD  Electronically Signed    DRB/MedQ  DD: 11/17/2007  DT: 11/17/2007  Job #: [id number]   cc:   Harrel Lemon. Merla Riches, M.D.

## 2010-11-27 NOTE — Op Note (Signed)
[name], [name]                  ACCOUNT NO.:  [id number]   MEDICAL RECORD NO.:  [id number]          PATIENT TYPE:  INP   LOCATION:  5001                         FACILITY:  [facility]   PHYSICIAN:  Kerrin Champagne, M.D.   DATE OF BIRTH:  May 07, 1930   DATE OF PROCEDURE:  01/31/2009  DATE OF DISCHARGE:                               OPERATIVE REPORT   PREOPERATIVE DIAGNOSES:  Painful right C5-6 and C6-7 spondylosis with a  posterior disk osteophyte complex over the right posterior C5-6 level  causing cord compression and right upper extremity symptoms.  Right C7  foraminal stenosis, degenerative disk disease of both C5-6 and C6-7.   POSTOPERATIVE DIAGNOSES:  Painful right C5-6 and C6-7 spondylosis with a  posterior disk osteophyte complex over the right posterior C5-6 level  causing cord compression and right upper extremity symptoms.  Right C7  foraminal stenosis, degenerative disk disease of both C5-6 and C6-7.   PROCEDURE:  Anterior cervical diskectomy and fusion at C5-6 and C6-7  with resection of the central and right-sided disk osteophyte complex  over the posterior aspect of the disk space at C5-6 affecting the right  side cord.  Right foraminal decompression at C6-7.  Fusion performed  using transgraft bone graft material, a 7-mm transgraft at the C5-6  level and an 8-mm transgraft at the C6-7 level, both filled centrally  with local bone graft harvested at the time of decompression.  Internal  fixation across two segments using an Alphatec Trestle plate, 62-ZH in  length with 14-mm screws.   SURGEON:  Kerrin Champagne, MD   ASSISTANT:  Wende Neighbors, PA-C   ANESTHESIA:  General via orotracheal intubation, Dr. Diamantina Monks.   FINDINGS:  Right-sided C5-6 hard disk herniation posteriorly with disk  osteophyte complex causing right C5-6 cord compression, right C6-7  foraminal stenosis, degenerative disk disease, both segments.   ESTIMATED BLOOD LOSS:  75 mL.   COMPLICATIONS:   None.   DRAIN:  TLS drain, 10-French, left neck.  Foley to straight drain.   BRIEF CLINICAL HISTORY:  This patient is a 77 year old female with  protracted history of cervical pain, radiation of the right upper  extremity.  She has undergone attempts at conservative management  including cervical traction therapy, use of steroids, epidural steroid  injections, all without relief of pain.  EMG nerve conduction studies  demonstrate primary axonal-type changes involving the nerves on the  right arm, no specific signs of nerve root entrapment of the right  elbow.  MRI scan has demonstrated several segments of degenerative disk  disease including C3-4, C4-5, C5-6, C6-7.  However, there were segments  that appeared to be C5-6 with disk osteophyte complex over the posterior  right side causing cord compression on this side and foraminal  entrapment on the right side at the C6-7 level.  The patient is  requiring use of narcotic medicines to relieve her pain.  Describes  inability to really be comfortable at night sleeping.  She is brought to  the operating room to undergo anterior diskectomy and fusion of both C5-  6 and C6-7.  She was also scheduled to undergo an anterior submuscular  transposition of the right ulnar nerve at the elbow; however, EMG nerve  conduction studies do not appear to be sufficient to warrant  decompression.  She was brought to the operating room to undergo  anterior diskectomy and fusion at C5-6 and C6-7.  She understands that  she has other segments that are degenerated, these will not fix those  segments; however, these appeared to be the worst segments causing her  severe neck and arm pain.  Clinically, she has weakness in her biceps.  She has a history of rotator cuff problems on her shoulders and has had  previous rotator cuff surgeries.  Pain is into the ulnar digits of the  right hand and should be consistent with lower cervical nerve  compression and it relate  to the area of cord compression.   DESCRIPTION OF PROCEDURE:  After adequate general anesthesia, the  patient had Foley catheter placed.  Standard preoperative antibiotics of  Ancef, seen in the preop holding area, had marking of the left neck and  the expected levels to be performed at C5-6 and C6-7.  Discussion with  the patient regarding the EMG nerve conduction, velocities was carried  out at this time as well indicating there truly was not a great deal of  significant findings, so that the planned anterior submuscular  transposition was based on ulnar nerve symptoms of the elbow.  They did  not appear to be mortise.  I explained this with Ms. Klingberg, she accepted,  then we changed the consent form to indicate that we have cancelled this  portion of the procedure.  We proceeded with the anterior diskectomy and  fusion at both C5-6 and C6-7.  The patient following her induction of  anesthesia and following preparation with the head in the [place]  horseshoe, a small pad between the shoulder blades using yellow-foam  rubber.  She had standard prep over the anterior neck with 5 pounds  cervical halter traction.  Neck was prepped with DuraPrep solution and  draped in the usual manner.  Iodine Vi-drape was used.  Standard  preoperative time-out protocol was then carried out identifying the  patient, procedure to be performed, expected concerns.  The length of  time and expected blood loss all of which were discussed, the levels of  all of C5-6 and C6-7.  Following this end, the patient's incision of the  neck was made about two fingerbreadths above the medial clavicle in line  with the patient's skin crease, incision length about 3 to 3.5 inches in  length through the skin and subcutaneous layers.  Platysmal layer was  then divided.  Veins coursing through the platysma showed some bleeding.  These were hemostasized using hemostats and then suture ligated using 2-  0 Vicryl sutures.  These  layers were then spread and the omohyoid muscle  identified, interval between the trachea and esophagus medially and the  carotid sheath laterally.  The omohyoid muscle coursing laterally was  then carefully bluntly dissected using a fingertip dissection to the  anterior aspect of the cervical spine.  The patient's esophagus and  trachea easily retracted to the right side identifying the anterior  prevertebral space and prevertebral fascia was quite thin in this  patient.  The leash of veins along the medial aspect of the longus colli  muscle was carefully cauterized using bipolar electrocautery and the  platysma layer then teased across the midline.  Spinal needle was placed  at C4-5, C5-6, and C6-7.  Intraoperative lateral radiograph of the  cervical spine demonstrated the needles at C4-5, C5-6, and C6-7 levels.  Needles were then carefully removed, removing disk at C5-6 and C6-7  levels anteriorly to continue identification of these levels.  Medial  border longus colli muscle was carefully freed up using electrocautery  and Key elevators bilaterally, controlling the bleeding.  McCullough  retractor was placed beneath the medial border longus colli muscle on  both sides.  Screw post was then placed into the anterior aspect of the  vertebral body at C6 and C7.  Distraction obtained across the disk space  at C6-7 using loupe magnification and headlamp.  Then, carefully disk  was resected from this level using combination of Kerrisons,  pituitaries, and microcurettes.  Anterior osteophytes across this disk  space anteriorly were then resected and morselized this bone and then  later used for allograft bone graft locally with the transgraft  material.  The disk space was further debrided of degenerative disk  material back to the posterior aspect of the disk space.  High-speed bur  was then used to remove cartilaginous endplates over the inferior aspect  of the C6 and superior aspect of the  C7 back to the posterior lip  osteophytes.  These were carefully thinned posteriorly using a high-  speed bur.  An 1-mm Kerrison was then used to resect the posterior lip  osteophyte centrally into the right side.  Foraminotomy performed over  the right C7 nerve root.  Nerve root noted to be exiting coursing  anteriorly and laterally at this point, then well decompressed.  Bur was  then used to make some small openings into the cortical endplates and  mouth were ingrowths of bone graft.   Sounding of the disk space was then carried out using a 7, then a 8 mm.  An 8-mm graft was chosen.  This was packed with morselized local bone  graft material obtaining resection of osteophytes.  Following  irrigation, hemostasis obtained using bipolar electrocautery for small  epidural veins as well as thrombin-soaked Gelfoam.  Graft was then  placed over the intervertebral disk space after careful measurement of  Cloward depth gauge, the depth was 14 mm at this level.  The graft was  then impacted into place subset beneath the anterior aspect of the disk  space from 1 or 2 mm.  Graft fitted quite nicely.  Additional bone graft  was able to be packed especially onto the left side to in the graft and  the endplates using the remaining morselized graft.  With this end, the  screw post was removed at the vertebral body of C7, bone wax applied to  control bleeding from screw post hole.  Screw post was then inserted  into the vertebral body of C5.  Distraction obtained between the screw  post of C6 and C5.  Note that the operating room microscope was draped  sterilely, brought into the field about midway portion.  Removal of disk  at the C6-7 level was used for careful resection of the osteophytes  posteriorly into the right C6-7 neural foramen.  Microscope in place,  then distraction obtained across the disk space at C5-6.  This was  dissected here using combination of pituitaries, microcurettes as well   as three 2-mm Kerrisons.  Disk space quite narrower in its dimension, a  6-mm dilator was used to slightly dilate the space.  A 7-mm was then  able to be inserted to obtain further exposure of the posterior aspect  of the disk space and large osteophytes here.  High-speed bur was then  used to further remove the cartilaginous bone material from the  endplates of the inferior aspect of the C5, superior aspect of the C6,  back to the posterior lip osteophytes and these were carefully freed.  A  1-mm Kerrison then used to resect the osteophytes over the inferior  aspect of the disk space of C5-6, affecting this out into the neural  foramen performing foraminotomy on the right side.   The bur was then used to thin the inner disk portion of the posterior  spur at C5 level inferiorly and posteriorly, then this was resected  using 1-mm Kerrison transversely decompressing the spinal cord at this  disk level resecting the posterior longitudinal ligament annulus and any  disk osteophyte that was compressing here.  Foraminotomy performed over  the right C6 nerve root using 1 or 2-mm Kerrisons, but nerve root again  noted to be exiting anteriorly and laterally without difficulty.  Bleeders were controlled using bipolar electrocautery.   Bone graft obtained from anterior lip osteophytes from resection of the  osteophytes posteriorly within the disk space, then neural foramen of  the right side was morselized, used to carefully pack the central  portion of the transgraft measured at 7-mm, which sounded the depth of  the intervertebral disk space at this level measuring about 16 mm.  Graft packed with local allograft bone graft, packed it into place.  Additional bone graft placed along the left side from the anterior  aspect of the disk space posteriorly between the endplates here.  Screw  posts were then removed about the C6 and the C5 level.   Bleeding controlled using bone wax both these areas.  A  high-speed bur  was then used carefully within the anterior aspect of vertebral body of  C6 and allow for careful kneeling the plate.  Plate also required  additional contour.  Plate was then placed across the anterior aspect of  the disk space after measurement of the plate obtained using a bone wax  applied to the cottonoid string measuring about 36 to 38 mm, a 37-mm  plate was chosen.  Following contour, which was then kneeled to the  anterior aspect of the vertebral bodies, carefully centered over the  vertebral body at the C6 level on the single temporary screw placed on  the left side.  Drill was then used to place drill hole at the C5 level  on the left side or 2-mm screw placed here.  Screw post was then  removed.  Temporarily, it stabilized at both C6 level and again a 14-mm  screw placed here.  Then drill hole made at the C7 level on the left  side into the vertebral body at C7 and drilled 14-mm appropriate size,  14-mm screw placed to the right side.  Similarly, then had drill holes  placed first at C5, 14-mm screw placed at C6, again 14-mm drill hole and  then 14-mm screw placed at C6 on the right and then finally at C7 level  on the right, drilling to 14-mm was placed at appropriate size screws.  Obtained excellent fixation, all those screws were well seated beneath  the central locking device provided with a Trestle plate.  No prominence  noted at any level.  Irrigation performed.  Esophagus demonstrated no  abnormalities.  Intraoperative radiograph obtained, which demonstrated  the upper level primarily, the screws within the C5-C6 level with an  excellent position alignment, C7 not well seen, felt not to be able to  be seen well without postoperative x-rays, so these will be obtained in  the recovery room later.  The disk space at C6-7 was well identified and  noted.  Irrigation carried out.  Careful hemostasis was obtained using  bipolar electrocautery at all levels on the  paracervical muscles until  there was no active bleeding present.  A 10-French TLS drain placed at  the depth of the incision exiting out through the subcu layers at the C7  level anteriorly and inferiorly.  Platysma layer then reapproximated  with interrupted 3-0 Vicryl sutures, deep subcu layer was reapproximated  with interrupted 3-0 Vicryl sutures, skin was closed with running subcu stitch of 4-0 Vicryl.  The patient's TLS drain was sewn in place with a  4-0 nylon stitch.  Dermabond was then applied and a Mepilex bandage.  The TLS drain was charged.  The patient was placed into the [place]  collar.  She was then reactivated, extubated, returned to recovery room  in satisfactory condition.  All instruments and sponge counts were  correct.      Kerrin Champagne, M.D.  Electronically Signed     JEN/MEDQ  D:  01/31/2009  T:  02/01/2009  Job:  [id number]

## 2010-11-27 NOTE — Assessment & Plan Note (Signed)
[facility]                            CARDIOLOGY OFFICE NOTE   Whitney, [name]                         MRN:          [id number]  DATE:06/15/2008                            DOB:          1930-01-03    PRIMARY CARE PHYSICIAN:  Whitney Maduro P. Merla Riches, MD   [name] is a delightful 77 year old woman with a history of  hypertension, obesity, diastolic heart failure, hyperlipidemia, and  coronary artery disease.  She underwent catheterization in April, which  showed a borderline lesion in the diagonal and a very high-grade lesion  in the right coronary artery.  LV function was normal.  She underwent  angioplasty with a bare-metal stent by Dr. Excell Seltzer.  This was complicated  by an edge dissection which was also stented.   She returns today for routine followup.  Overall, she is doing fairly  well.  She does continue to have some twinges in her chest, but these  are very different from her angina.  She has noticed that on some night,  she has had mild orthopnea and props herself upon the pillows, this  happens just occasionally.  Her lower extremity edema has been well  controlled.  No PND.  She does note that she has joint pains.  We assume  that this is osteoarthritis, but she is wondering if it could be coming  from her Zocor.   She is currently in the donut hole and is having problems affording her  medication.   CURRENT MEDICATIONS:  1. Lasix 40 a day.  2. Potassium 20 a day.  3. Celebrex 200 a day.  4. Trazodone 50 nightly.  5. Zocor 40 a day.  6. Metoprolol 50 b.i.d.  7. Aspirin 325 a day.  8. Plavix 75 a day.  9. Prilosec 20 a day.  10.Benazepril 40 a day.  11.Neurontin 600 t.i.d.  12.Hydrocodone.   PHYSICAL EXAMINATION:  GENERAL:  She is an elderly woman in no acute  distress, ambulates around the clinic without any respiratory  difficulty.  VITAL SIGNS:  Blood pressure initially was 140/89, on my manual recheck  164/74, and heart rate  62.  HEENT:  Normal.  NECK:  Supple.  No JVD.  Carotids are 2+ bilaterally without any bruits.  There is no lymphadenopathy or thyromegaly.  CARDIAC:  PMI is nondisplaced.  She is regular with an S4.  No murmurs.  LUNGS:  Clear.  CHEST:  On her chest wall, there is a soft fatty like tumor over her  sternum.  She feels this is slightly more prominent than before.  ABDOMEN:  Obese, nontender, and nondistended.  No hepatosplenomegaly.  No bruits.  No masses.  Good bowel sounds.  EXTREMITIES:  Warm.  No cyanosis, clubbing, or edema.  SKIN:  No rash.  NEUROLOGIC:  Alert and oriented x3.  Cranial nerves II through XII are  intact.  Moves all 4 extremities without difficulty.  Affect is  pleasant.   ASSESSMENT AND PLAN:  1. Coronary artery disease, this is stable.  No evidence of ischemia.      Given her  problems affording medications, we will go ahead to stop      her Plavix after her current supply runs out next month.  2. Muscle aches.  I think this is mostly due to her osteoarthritis.      Her CKs have been negative previously.  I told her if she like, she      can try a trial of stopping her Zocor and see if this makes any      difference.  I told her not to do this for too long.  3. Hypertension.  Blood pressure is mildly elevated.  Given her volume      overload at times, we will start spironolactone 25 mg a day to see      if we can help to cover both.  We will stop her potassium.  We will      check a BMET next week.  4. Hyperlipidemia.  We will check her lipids next week.  We will send      a copy to Dr. Merla [name] as well.  Goal LDL is less than 70.  5. Diastolic heart failure.  Volume status currently looks good.      Continue current therapy.  6. Fatty tumor over her sternum.  We will get a noncontrast chest CT      to further evaluate.   DISPOSITION:  I will see her back in 6 months for followup.     Whitney Buckles. Bensimhon, MD  Electronically Signed    DRB/MedQ  DD:  06/15/2008  DT: 06/16/2008  Job #: [id number]   cc:   [name]. Merla [name], M.D.

## 2010-11-27 NOTE — Assessment & Plan Note (Signed)
[facility]                            CARDIOLOGY OFFICE NOTE   [name], LEONHART                         MRN:          [id number]  DATE:10/30/2007                            DOB:          Feb 08, 1930    PRIMARY CARE/REFERRING DOCTOR:  Dr. Ellamae Sia.   REASON FOR EVALUATION:  Congestive heart failure.   [name] is a delightful 77 year old woman with a history of  hypertension, obesity, diastolic heart failure, and reported COPD who is  referred for further evaluation of congestive heart failure.   [name] denies any history of known coronary artery disease.  She has  never had a cardiac catheterization or a stress test.  She tells me she  developed heart failure in 2002 with significant dyspnea and lower  extremity edema.  She had an echocardiogram which showed normal LV  function with 1+ mitral regurgitation and mildly elevated systolic right-  sided pressures.  She was started on Lasix and had done quite well since  that time.   However, since over the past 6 months she has been developing  progressive shortness of breath and now she cannot walk even 100 or 200  yards without dyspnea.  She denies any orthopnea.  No PND, no lower  extremity edema.  She does note occasional chest pressure but this is  not often exertional.  She said last night that she woke with some  nausea and had some chest pain with this.  She feels like the nausea  came first.  This resolved sort of by itself and has not come back.  She  has been ambulating today without any chest pain.  She has had chronic  dyspnea.   REVIEW OF SYSTEMS:  She notes significant arthritis as well as reflux  disease.  She denies any diabetes.  She has had a recent history of  anemia and had a workup for it and that has all been reportedly  negative.  She does have occasional wheezing.  Remainder of review of  systems is negative except for HPI and problem list.   PROBLEM LIST:  1.  Hypertension.  2. Obesity.  3. History of diastolic heart failure as above.  4. Osteoarthritis.  5. History of reported COPD/asthma; however, only smoked for about 10      years.  6. History of antral anti-inflammatory-drug-induced ulcer back in      2004.   CURRENT MEDICATIONS:  1. Lasix 40 a day.  2. Nexium 40 a day.  3. Metoclopramide 10 mg at night.  4. Potassium 20 a day.  5. Celebrex 200 a day.  6. Benicar 40 a day.  7. Trazodone 50 mg q.h.s.   ALLERGIES:  No known drug allergies.   SOCIAL HISTORY:  She is married.  However, her husband was just placed  in a nursing home due to dementia.  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  Smoked about a half-pack of cigarettes a day for 10  years, quit in 1969.  No alcohol.   FAMILY HISTORY:  Notable  for three brothers dying in their 30s and 71s  with myocardial infarctions.  Mother died of a heart attack in her 60s.  Father died of heart failure in his 45s, question sudden cardiac death.   PHYSICAL EXAM:  She is in no acute distress, ambulates around the clinic  without any respiratory difficulty.  Blood pressure is 134/58, heart  rate is 94.  HEENT:  Normal.  NECK:  Supple.  There is no JVD.  Carotids are 2+ bilaterally without  any bruits.  There is no lymphadenopathy or thyromegaly.  CARDIAC:  She has a regular rate and rhythm.  PMI is nondisplaced.  There is a soft 2/6 systolic ejection murmur at the left sternal border.  No rub or gallop.  LUNGS:  Clear.  ABDOMEN:  Obese, nontender, nondistended.  No hepatosplenomegaly.  No  bruits, no masses.  Good bowel sounds.  EXTREMITIES:  Warm with no cyanosis, clubbing or edema.  No rash.  NEURO:  Alert and oriented x3.  Cranial nerves II-XII are intact.  Moves  all four extremities without difficulty.  Affect is pleasant.   EKG shows normal sinus rhythm, rate of 94 with a PVC.  There is mild  diffuse nonspecific ST abnormalities throughout.  This is  nondiagnostic.   ASSESSMENT AND PLAN:  1. Dyspnea and chest pain.  I am significantly concerned that her      symptoms could be representing progressive angina.  I have      discussed with her the various possibilities but have strongly      suggested right and left heart catheterization on Monday.  She has      agreed to this.  She knows that if her symptoms are getting acutely      worse that she needs to call 9-1-1.  2. Hypertension.  This is suboptimally controlled.  We will need to      titrate her medications.  We can do this after catheterization.  3. History of chronic obstructive pulmonary disease.  She does have a      significant mill exposure but her exam is not consistent with COPD.      I think at some point she would benefit from full pulmonary      function tests if she has not had these already.  4. History of diastolic heart failure.  No evidence of volume overload      on exam currently.   DISPOSITION:  Pending the results of her catheterization early next  week.     Bevelyn Buckles. Bensimhon, MD  Electronically Signed    DRB/MedQ  DD: 10/30/2007  DT: 10/30/2007  Job #: [id number]   cc:   Harrel Lemon. Merla Riches, M.D.

## 2010-11-27 NOTE — Cardiovascular Report (Signed)
[name], [name]                  ACCOUNT NO.:  [id number]   MEDICAL RECORD NO.:  [id number]          PATIENT TYPE:  INP   LOCATION:  6527                         FACILITY:  [facility]   PHYSICIAN:  Veverly Fells. Excell Seltzer, MD  DATE OF BIRTH:  [birth year]   DATE OF PROCEDURE:  DATE OF DISCHARGE:                            CARDIAC CATHETERIZATION   PROCEDURE:  PTCA and stenting of the right coronary artery with 2 Vision  bare-metal stents, Angio-Seal of the right femoral artery.   INDICATIONS:  [name] is a delightful 77 year old woman who underwent  diagnostic catheterization on April 20 by Dr. Gala Romney.  The procedure  was performed for stable angina.  This demonstrated severe mid RCA  stenosis of 90%.  There was only mild nonobstructive disease in the left  coronary artery.  We elected to proceed with stenting of the mid-right  coronary artery.  In the setting of chronic iron deficiency anemia, I  planned on using a bare-metal stent.   Risks and indications of procedure were reviewed with the patient.  Informed consent was obtained.  The right groin was prepped, draped, and  anesthetized with 1% lidocaine using modified Seldinger technique, a 6-  French sheath was placed in the right femoral artery, a 6-French JL4  catheter was used for guide catheter.  Angiomax was used for  anticoagulation.  Once therapeutic ACT was achieved, initial angiography  demonstrated severe stenosis in the mid-right coronary artery with mild  plaque proximal to the severe area.  I planned on just treating the  severe stenosis in the mid vessel.  A cougar guidewire was passed easily  beyond the area of stenosis into the distal right coronary artery.  The  lesion was predilated with 2.5 x 15-mm Maverick Balloon up to 10  atmospheres.  The balloon was used to measure lesion length.  I elected  to stent the vessel with a 4.0 x 18-mm Vision stent, which was deployed  at 14 atmospheres.  The stent was well expanded.   There was a good  angiographic result with TIMI III flow.  I postdilated the stent with a  4.0 x 15 Quantum Maverick, which was taken to 16 atmospheres on 2  inflations.  Following postdilatation, final angiography demonstrated a  widely patent stent with TIMI III flow.  However, at proximal edge of  the stent, there was a linear opacity that was concerning for an edge  dissection.  This did not appear to compromise the lumen or compromised  flow.  However, I elected to treat it with a second stent.  A 4.0 x 12-  mm multilength Vision stent was carefully positioned to overlap the  proximal edge of the first stent.  This was deployed at 14 atmospheres.  It was well expanded with an excellent angiographic result.  No further  irregularity noted.  I then advanced the balloon and inflated it to 18  atmospheres to cover the overlapped area.  Final angiography  demonstrated an excellent angiographic result with widely patent stents  and TIMI III flow throughout.  At completion of procedure, an Angio-Seal  device was used to close the femoral arteriotomy.   ASSESSMENT:  Successful stenting of the mid-right coronary artery with  overlapping multilength Vision bare-metal stents, 90% stenosis to 0.  TIMI III flow pre and post.   PLAN:  The patient should continue aspirin indefinitely and 30 days of  Plavix.      Veverly Fells. Excell Seltzer, MD  Electronically Signed     MDC/MEDQ  D:  11/04/2007  T:  11/05/2007  Job:  [id number]   cc:   Bevelyn Buckles. Bensimhon, MD  Harrel Lemon. Merla Riches, M.D.

## 2010-11-27 NOTE — Discharge Summary (Signed)
[name], [name]                  ACCOUNT NO.:  [id number]   MEDICAL RECORD NO.:  [id number]          PATIENT TYPE:  INP   LOCATION:  6527                         FACILITY:  [facility]   PHYSICIAN:  Veverly Fells. Excell Seltzer, MD  DATE OF BIRTH:  [birth year]   DATE OF ADMISSION:  11/04/2007  DATE OF DISCHARGE:  11/06/2007                               DISCHARGE SUMMARY   PRIMARY CARDIOLOGIST:  Dr. Arvilla Meres.   PRIMARY CARE PHYSICIAN:  Dr. Ellamae Sia.   PROCEDURES PERFORMED DURING HOSPITALIZATION:  1. Cardiac catheterization completed on November 02, 2007, by Dr. Arvilla Meres revealing two-vessel coronary artery disease with high-      grade lesion in the right coronary artery and borderline lesion in      the diagonal with normal left ventricular function and elevated      filling pressures.  2. Status post PTCA and stenting of the right coronary artery with 2      Vision bare-metal stents, Angio-Seal of the right femoral artery.   FINAL DISCHARGE DIAGNOSES:  1. Coronary artery disease.  A:  Status post cardiac catheterization on November 02, 2007.  B:  Status post PTCA and stenting of the right coronary artery with 2  Vision bare-metal stents on November 04, 2007.  1. Hypertension.  2. Obesity.  3. History of diastolic heart failure.  4. Osteoarthritis.  5. History of reported chronic obstructive pulmonary disease and      asthma.  6. History of anti-inflammatory drug-induced ulcer back in 2004.   HOSPITAL COURSE:  This is a 77 year old female with a history as stated  above who was seen in the office originally by Dr. Arvilla Meres  secondary for chest pain concerning for angina.  The patient was  referred for diagnostic cardiac catheterization, which was performed on  November 02, 2007.  Cardiac catheterization revealed two-vessel coronary  artery disease with high-grade lesion in the right coronary artery and  borderline lesion in the diagonal.  The patient subsequently  underwent  PCI and stenting of the right coronary artery per Dr. Excell Seltzer on November 04, 2007.  At which time, the patient had 2 bare-metal stents placed  followed by a 4.0 x 2 mm Multi-Link Vision stent followed by a 4.0 x 15  Quantum Maverick stent.  Please see Dr. Earmon Phoenix thorough cardiac  catheterization and PTCA and stenting note for more details.  The  patient tolerated the procedure well.  There were no further evidence of  discomfort.  There is no evidence of hematoma, bleeding, or infection.  The patient did have some atrial fibrillation with RVR rate of 110-120.  The patient was given Lopressor 5 mg IV x1 and remained in atrial  fibrillation for approximately 1 hour, p.o. Lopressor 25 mg was given  thereafter.  The patient returned to normal sinus rhythm later in the  day and remained in normal sinus rhythm until discharge.  The patient  was continued on metoprolol 50 mg b.i.d.  She was also placed on Plavix,  aspirin, and simvastatin prior to discharge.  The patient was seen and examined by Dr. Tonny Bollman on the day of  discharge.  She remained in normal sinus rhythm.  The patient's lab work  was found to be normal.  Blood pressure was 106/54 with a heart rate of  69 normal sinus rhythm.  The patient is to go home and followup with Dr.  Gala Romney in 2 weeks with a CBC and BMET.  The patient has been given  new prescriptions for Plavix, simvastatin, and metoprolol to be taken at  home along with her medication she was on prior to admission.   DISCHARGE LABS:  Sodium 139, potassium 4.2, chloride 107, CO2 25, BUN  22, creatinine 1.4, glucose 113, hemoglobin 8.9, hematocrit 27.5, white  blood cells 8.1, and platelets 212.   DISCHARGE MEDICATIONS:  1. Aspirin 81 mg daily.  2. Plavix 75 mg daily (prescription provided).  3. Metoprolol 50 mg twice a day (prescription provided).  4. Simvastatin 40 mg at bedtime (prescription provided).  5. Nitroglycerin 0.4 mg sublingual  (prescription provided).  6. Lasix 40 mg daily.  7. Nexium 40 mg daily.  8. Metoclopramide 10 mg at bedtime.  9. Potassium 20 mg daily.  10.Celebrex 200 mg daily.  11.Benicar 40 mg daily.  12.Vicodin 1-2 tablets p.r.n. pain.  13.Trazodone 50 mg at bedtime.   ALLERGIES:  No known drug allergies.   FOLLOWUP PLANS AND APPOINTMENTS:  1. The patient will follow with Dr. Arvilla Meres on Fant 5, 2009,      at 2:00 p.m.  2. The patient is to follow up BMET and CBC on that appointment.  3. The patient is given post cardiac catheterization instructions with      particular emphasis on the right groin site for evidence of      bleeding, hematoma, and infection.  4. The patient is to follow up with primary care physician for      continued medical management.   Time spent with the patient to include physician time is 30 minutes.      Bettey Mare. Lyman Bishop, NP      Veverly Fells. Excell Seltzer, MD  Electronically Signed    KML/MEDQ  D:  11/06/2007  T:  11/07/2007  Job:  [id number]   cc:   Harrel Lemon. Merla Riches, M.D.

## 2010-11-30 NOTE — Discharge Summary (Signed)
[facility]  Patient:    [name], [name]                         MRN: [id number] Adm. Date:  [id number] Disc. Date: [id number] Attending:  Faith Rogue T Dictator:   Bynum Bellows. Idacavage, P.A.C. CC:         Faith Rogue, M.D.             Gordy Savers, M.D.             Trudee Grip, M.D.                           Discharge Summary  DIAGNOSES: 1. Left total knee revision. 2. Hypertension. 3. Gastroesophageal reflux disease. 4. On Coumadin for deep vein thrombosis prophylaxis. 5. Pseudomonas urinary tract infection.  HOSPITAL COURSE:  The patient is a 77 year old female with a history of left total knee arthroplasty in 1995, status post revision in 1996, now with loosening of the prosthesis.  She underwent left total knee revision October 05, 1999 by Dr. Despina Hick.  She was placed on Coumadin for DVT prophylaxis and allowed weightbearing as tolerated.  It was felt that she would require impatient rehab prior to returning home.  Therefore, she was admitted to [facility] rehab center on March 28, where she has participated in greater than three hours per day of physical and occupational therapies.  She has been maintained on Coumadin anticoagulation while in the unit.  Urinalysis did show Pseudomonas UTI and she has been placed on Levaquin for this.  At this point in time, she is noted to be independent with bed mobility and transfers and able to ambulate 200 feet with a standard walker.  MEDICATIONS AT THE TIME OF DISCHARGE: 1. Coumadin 1 mg tablet take 2 tablets daily until Boyum 4. 2. Levaquin 500 mg daily for three more days. 3. Trinsicon twice a day. 4. Tylox 1 or 2 every four hours as needed for pain. 5. Hydrochlorothiazide 50 mg at bedtime as needed. 6. ______ 10 mg daily. 7. Prilosec 20 mg daily. 8. Premarin 1.25 mg daily.  ACTIVITY:  Intermittent assistance, weightbearing as tolerated.  DIET:  She is to follow a balanced  diet.  WOUND CARE:  Keep the wound clean and dry.  She Dinius shower.  SPECIAL INSTRUCTIONS:  She is going to have outpatient physical therapy.  She is requested to go to Dr. Leanora Ivanoff office on Friday for a PT/INR drawn and follow-up with Dr. Despina Hick in two weeks.  CONDITION ON DISCHARGE:  Stable. DD:  10/17/99 TD:  10/19/99 Job: [id number]

## 2010-11-30 NOTE — Assessment & Plan Note (Signed)
[facility]                              [facility] OFFICE NOTE   SHARYON, PEITZ                         MRN:          [id number]  DATE:02/26/2006                            DOB:          11/12/1929    A 77 year old female seen today for a comprehensive exam.   HISTORY:  She has a history of advanced degenerative joint disease with  chronic pain, hypertension, history of colonic polyps.   PAST SURGICAL HISTORY:  She is status post multiple surgical procedures  including laminectomies, bilateral knee replacement surgery, rotator cuff  surgery.  She has also had an appendectomy, cholecystectomy, hysterectomy.   REVIEW OF SYSTEMS:  She has a history of GERD and stricture formation.   EXAMINATION:  GENERAL:  Exam revealed an obese white female.  VITAL SIGNS:  Blood pressure 130/80.  HEENT:  Fundi, ears, nose and throat clear.  NECK:  No bruits.  CHEST:  Clear.  BREAST:  Normal.  CARDIOVASCULAR:  Normal heart sounds no murmurs.  ABDOMEN:  Obese, soft, non-tender.  Surgical scars noted.  PELVIC:  Revealed short vaginal pouch, no adnexal masses.  RECTAL:  Stool heme-negative.  No rectal masses.  EXTREMITIES:  Revealed no edema.  Bilateral knee scars noted.  Peripheral  pulses intact.  NEUROLOGICAL:  Negative.   IMPRESSION:  1. Hypertension.  2. Menopausal syndrome.  3. Advanced degenerative joint disease.   DISPOSITION:  We will encourage followup colonoscopy, it has been three  years.Medical regimen unchanged.  Medicines refilled.  Samples provided. We  will reassess in 55-months.  Mammogram encouraged.                                   Gordy Savers, MD   PFK/MedQ  DD:  02/26/2006  DT:  02/26/2006  Job #:  [id number]

## 2010-11-30 NOTE — H&P (Signed)
NAME:  [name], [name]                            ACCOUNT NO.:  [id number]   MEDICAL RECORD NO.:  [id number]                  PATIENT TYPE:   LOCATION:                                       FACILITY:   PHYSICIAN:  Dionne Ano. Everlene Other, M.D.         DATE OF BIRTH:   DATE OF ADMISSION:  DATE OF DISCHARGE:                                HISTORY & PHYSICAL   CHIEF COMPLAINT:  Right hand pain.   HISTORY OF PRESENT ILLNESS:  The patient is a very pleasant female who  presents with right greater than left upper extremity complaints. The  patient has a known history of end stage degenerative joint disease and  arthritis of the upper extremities as well as carpal tunnel syndrome in the  upper extremities. She has failed conservative management and presents for  surgical reconstruction of her right [facility] joint of the thumb and carpal  tunnel release. She understands risks and benefits of the surgery including  the risks of bleeding, anesthesia, damage to nerve, etc. The patient has  discussed this with her regular physician, Dr. Bing Matter.   PAST MEDICAL HISTORY:  Hypertension, acid reflux, hiatal hernia, and history  of arthritis.   PAST SURGICAL HISTORY:  Bilateral rotator cuff repair, lumbar laminectomy,  left and right knee surgery. Tonsillectomy, appendectomy, cholecystectomy.   MEDICATIONS:  Celebrex, Lasix, Prilosec, and Diovan.   ALLERGIES:  No known drug allergies.   SOCIAL HISTORY:  She does not smoke or drink. Her regular physician is Dr.  Bing Matter.   FAMILY HISTORY:  Noncontributory.   REVIEW OF SYSTEMS:  Negative.   PHYSICAL EXAMINATION:  GENERAL: A pleasant 77 year old female. Alert and  oriented. No acute distress.  VITAL SIGNS: Stable.  HEENT: Normocephalic, atraumatic. Pupils are equal, round, and reactive to  light and accommodation. Extraocular muscles intact. Oropharynx and  nasopharynx are clear.  NECK: Supple. Without step-off or abnormality.  CHEST: Clear  to auscultation and percussion.  No rhonchi or rales.  HEART: Regular rate and rhythm.  No rubs or thrills or palpations  appreciated on examination.  ABDOMEN: Soft, nontender, and nondistended.  RECTAL/GU: Examination not done as it is not pertinent to the present  diagnosis.  EXTREMITIES: Bilateral CMC joint arthritis with carpal tunnel symptoms. The  patient has normal refill and stability. There is no signs of dystrophic  reaction. Elbow examination is benign.   IMPRESSION:  1. Osteoarthritis bilateral CMC joints, right greater than left.  2. Carpal tunnel syndrome.  3. Hypertension.  4. History of peptic ulcer disease.  5. Hiatal hernia.  6. Status post knee replacement in the past.   PLAN:  The patient is going to be admitted to [facility]  to undergo right [facility] joint replacement with trapezium excision and tendon  reconstruction as well as carpal tunnel release. She understands the risks  and benefits and has asked to proceed. All questions have been encouraged  and answered preoperatively and we will proceed accordingly.                                                Dionne Ano. Everlene Other, M.D.    Nash Mantis  D:  04/19/2002  T:  04/19/2002  Job:  [id number]

## 2010-11-30 NOTE — Discharge Summary (Signed)
NAME:  Whitney, [name]                            ACCOUNT NO.:  [id number]   MEDICAL RECORD NO.:  [id number]                   PATIENT TYPE:  INP   LOCATION:  0455                                 FACILITY:  [facility]   PHYSICIAN:  Kerrin Champagne, M.D.                DATE OF BIRTH:  August 13, 1929   DATE OF ADMISSION:  10/29/2002  DATE OF DISCHARGE:  11/03/2002                                 DISCHARGE SUMMARY   ADMISSION DIAGNOSES:  1. Left L3-4 foraminal stenosis and lateral recess stenosis affecting     primarily the left L4 nerve root.  2. Hypertension.  3. Remote history of mild congestive heart failure.  4. Peptic ulcer disease.  5. Hiatal hernia.  6. Arthritis.  7. Stress incontinence.  8. Status post bilateral total knee arthroplasty.   DISCHARGE DIAGNOSES:  1. Left lateral recess stenosis and foraminal stenosis affecting primarily     the left L4 nerve root.  2. Left dural tear at the lateral aspect of the thecal sac adjacent to the     L4 neural foramen.  3. Hypertension.  4. Remote history of mild congestive heart failure.  5. Peptic ulcer disease.  6. Hiatal hernia.  7. Arthritis.  8. Stress incontinence.  9. Status post bilateral total knee arthroplasty.  10.      Postoperative headache requiring strict bed rest for 48 hours.  11.      Exacerbation of reflux during hospital stay.  12.      Posthemorrhagic anemia.   PROCEDURE:  1. On October 29, 2002 patient underwent left lateral recess decompression at     L3-4.  2. Repair of dural leak left L4 level using a single suture with fibrin     glue.  This was performed by Kerrin Champagne, M.D., assisted by Wende Neighbors, P.A. under general anesthesia.   CONSULTATIONS:  None.   BRIEF HISTORY:  The patient is a 77 year old female with progressive  worsening of left leg pain and left-sided neurogenic claudication primarily  in the L4 distribution.  The patient underwent extensive evaluation and  conservative  management including steroid injections, myelogram, and  narcotic pain medication.  Her myelogram did demonstrate left-sided L4 nerve  root entrapment secondary to lateral recess compression at the L3-4 level  and foraminal entrapment involving the left L4 nerve root.  It was felt she  would require surgical intervention as she had failed conservative  managements.  She was admitted for the procedure as stated above.   HOSPITAL COURSE:  The patient tolerated the procedure under general  anesthesia without complications.  On the first postoperative day she did  have some mild nausea and was having flatus, but no bowel movement.  Through  the day she continued with nausea, but had some improvement with  antiemetics.  She was given a clear liquid diet and eventually  her diet was  advanced as she became less nauseated.  Neurovascular and motor function of  the lower extremities was noted to be intact postoperatively.  She continued  to have some subjective numbness which was noted to be preoperatively as  well.  Hemoglobin on the first postoperative day was 10.6.  Hemoglobin  remained stable with lowest value being 9.5.  On the second postoperative  day patient developed headaches which were noted to be nuchal and frontal.  She also continued to have some mild nausea.  It was felt these could be  related to her dural tear and she was placed at complete bed rest and given  increase in her IV fluids.  Unfortunately, patient had significant increase  in her reflux symptoms with her bed being flat.  Initially, the head of the  bed was raised 10-15 degrees and she still had minimal relief of the reflux  symptoms.  Slowly, the head of the bed was placed at 30 degrees over the  next 24 hours and her symptoms resolved.  Her headaches resolved as well  after only 12 hours of flat bed rest.  She was given Esgic Plus for  headaches; however, only required one dose of this and was using other  analgesics  for her discomfort prior to discharge.  The patient noted  decreased nausea once her reflux was under control.  She utilized Mylanta  while in the hospital and also Protonix.  The patient was given laxative and  was able to have a bowel movement prior to being discharged home.  Once  physical therapy was started she was able to ambulate quite well.  She  utilized a walker for ambulating purposes.  The patient's wound was healing  well throughout the hospital stay.  No drainage, erythema, or edema was  noted.  She had positive bowel sounds throughout the hospital stay with her  abdomen remaining soft and nontender.  The patient was weaned from PCA  analgesics to oral analgesics without difficulty.  On postoperative day  number five patient was stable and afebrile.  Pain was well controlled and  she was anxious for discharge to home.  Arrangements were made for home  health referral for physical therapy and occupational therapy prior to her  discharge to home.  It was of note that patient had an abrasion of the left  cheek which was due to positioning during the operative procedure.  This was  healing well throughout the hospital stay and antibiotic ointment was  ordered daily and to be continued at home.   LABORATORIES:  Admission EKG showed normal sinus rhythm with nonspecific ST  abnormality with no significant change since last tracing on October 17, 1998  confirmed by Olga Millers, M.D.  Chest x-ray on admission showed little  change since October 17, 1998.  Chemistry studies on admission were within  normal limits with exception of BUN of 28.  Repeat BMET on November 01, 2002  showed values normal with exception of calcium 8.1 and glucose 124.  Hemoglobin and hematocrit on admission were 11.8 and 35.5, respectively.  Prior to discharge hemoglobin was 9.5 with hematocrit 28.7.  PLAN:  The patient was discharged to her home.  Arrangements were made for  home health physical therapy and  occupational therapy to assist with  ambulation and gait training as well as ADLs.  Dressing change to be done  daily at home by the patient.  She was given instructions and supplies to do  so.  She will be able to shower when she is at home as long as there is no  wound drainage.  She is to avoid lifting, bending, or twisting.  She will  utilize antibiotic ointment to the abrasion of her left cheek.   DISCHARGE MEDICATIONS:  1. Percocet one to two q.4-6h. as needed for pain.  2. Robaxin 500 mg one q.8h. as needed for spasm.  3. She will resume all of her other home medications.   FOLLOW UP:  The patient will follow up with Kerrin Champagne, M.D. 10-14 days  following her surgery.  She has been advised to call if she develops  recurring headache, nausea and vomiting, or fevers.   CONDITION ON DISCHARGE:  Stable.     Wende Neighbors, P.A.                    Kerrin Champagne, M.D.   SMV/MEDQ  D:  11/24/2002  T:  11/24/2002  Job:  [id number]

## 2010-11-30 NOTE — Op Note (Signed)
NAME:  [name], [name]                            ACCOUNT NO.:  [id number]   MEDICAL RECORD NO.:  [id number]                   PATIENT TYPE:  AMB   LOCATION:  DAY                                  FACILITY:  [facility]   PHYSICIAN:  Kerrin Champagne, M.D.                DATE OF BIRTH:  1929/08/29   DATE OF PROCEDURE:  10/29/2002  DATE OF DISCHARGE:                                 OPERATIVE REPORT   PREOPERATIVE DIAGNOSES:  Left L3-4 foraminal stenosis and lateral recess  stenosis affecting primarily the left L4 nerve root.   POSTOPERATIVE DIAGNOSES:  1. Left lateral recess stenosis and foraminal stenosis affecting primarily     the left L4 nerve root.  2. Left dural tear at the lateral aspect of the thecal sac adjacent to the     L4 neural foramen.   PROCEDURE:  1. Left lateral recess decompression at L3-4.  2. Repair of dural leak, left L4 level, using a single suture with fibrin     glue.   SURGEON:  Kerrin Champagne, M.D.   ASSISTANT:  Wende Neighbors, P.A.-C.   ANESTHESIA:  GOT.  Jenelle Mages. Fortune, M.D.   ESTIMATED BLOOD LOSS:  450 mL.   DRAINS:  None.   BRIEF CLINICAL HISTORY:  The patient is a 77 year old female with a history  of progressive worsening of left-sided leg neurogenic claudication,  primarily in the L4 distribution down the anterior aspect of the left calf  and left knee.  The pain was with ambulation and upright position.  She  underwent extensive evaluation.  Attempts at conservative management  including steroid injection which only temporized relief of pain in her leg.  Myelogram demonstrated a left-sided L4 nerve root entrapment secondary to  lateral recess decompression at the L3-4 level and foraminal entrapment  involving the left L4 nerve root.  The patient is brought to the operating  room to undergo a left-sided lateral recess decompression at L3-4 with  decompression of the left L4 neural foramen.   INTRAOPERATIVE FINDINGS:  The patient was found  to have hypertrophic changes  involving the left side L3-4 facet.  This was impressing upon the left  thecal sac, affecting the left L4 nerve root.  She was found to have  hypertrophic changes involving a reflected portion of the ligamentum flavum  at the L4 level neural foramen and hypertrophic changes involving the facet  of L4-5, affecting the underlying L4 nerve root.  A dural tear occurred with  lifting of the hypertrophic ligamentum flavum off of the left side of the  thecal sac adjacent to the L4 neural foramen.  This represented hypertrophic  ligamentum flavum which was reflecting about the medial aspect of the L4-5  facet and also on the undersurface or the anterior surface of the L4 lamina.   DESCRIPTION OF PROCEDURE:  After adequate general anesthesia, the patient  placed on the Andrews frame in a prone position, not able to use the normal  knee-chest position due to bilateral total knee arthroplasties.  Standard  prep with DuraPrep solution, draped in the usual manner.  Preoperative  antibiotics, Ancef.  An iodine Vi-Drape was used.  The incision at the  expected L3-4 level using a 10 blade scalpel after infiltration with  Marcaine 0.5% with 1:200,000 epinephrine.  Ellipsing the old incisional scar  at the superior aspect of the previous old incision that extended from about  L4 to S1.  The incision through skin and subcu layers directly down to the  spinous process of the expected L3 and L4.  Electrocautery used to incise  along the distal fascia on either side of the L3 spinous process and the  clamps placed on this level.  Intraoperative lateral radiograph of the  lumbar spine demonstrated the clamp on the L3 spinous process.  This was  then marked using electrocautery.  The left side lumbodorsal fascia  attachment to the spinous process at L3 and L4 was then incised, Cobbs used  to elevate the paralumbar muscles off the left posterior elements of both L3  and L4, and the  attachments to the inferior aspect of the lamina of L3 were  debrided using electrocautery and Cobbs.  McCullough retractor was inserted.  A high-speed bur then used to carefully thin the left side of the lamina of  L3 towards the attachment of the ligamentum flavum on the anterior surface  here and then medial aspect of the left L3-4 facet was thinned about 20%.  A  2 mm Kerrison was then able to be introduced to perform the remaining  portion of the semi-hemi-laminectomy over the inferior aspect of left L3  lamina, removing bone and allowing for exposure to the ligamentum flavum of  the left side.  The 2 mm Kerrison was then able to be introduced over the  superior aspect of the lamina of L4, used to perform a wide foraminotomy of  the left L4 nerve root.  Ligamentum flavum was then carefully lifted off the  thecal sac at the L3-4 level, resected superiorly and medially, exposing the  lateral recess along the left side, and the superior articular process of L4  at the L3-4 facet was then carefully trimmed medially using 2 and 3 mm  Kerrisons, decompressing the lateral recess of the left side and the left L4  nerve root that was found to be entrapped here.  The superior aspect of the  lamina of L5 appeared to be indenting on the posterior aspect of the thecal  of the left side, along with hypertrophic ligamentum flavum that was  inserted into the anterior aspect of this lamina, and reflected onto the  medial aspect of the L4-5 facet, affecting primarily the L4 nerve root and  its entry into the neural foramen.  This was then carefully debrided using 2  and 3 mm Kerrisons, resecting the hypertropic ligamentum flavum at its  reflected portion.  In the process of so doing, a dural leak occurred on the  left side over the lateral aspect of the thecal sac at the L4 level adjacent to the L4 neural foramen.  The L4 nerve root exiting superior to this area  demonstrated no significant abnormality.   The tear was through the dura,  moderate but did not involve the arachnoid layer.  The patient had several  large epidural veins that showed bleeding throughout the case.  These  required hemostasis prior to repair of the dural tear.  This required the  use of Gelfoam and pledgets.  When hemostasis was obtained, then dural  repair was attempted but again, bleeding was noted to occur.  Finally a  single suture was able to be placed to repair the small dural bleb which  measured about 2-3 mm in length.  Following a single suture repair using a 4-  0 Nurolon suture, there was no evidence of dural leak evident with Valsalva  to 30 mm of pressure.  Fibrin glue dural repair material was then mixed  requiring an additional 20-30 minutes.  This material was then placed into  the lateral recess, along the lateral aspect of the thecal sac, in the area  of the dural repair, giving an additional repair and strength to the repair  performed.  Once this was completed, then the incision was closed,  approximating the lumbodorsal fascia in the midline with interrupted #1  Vicryl sutures, deep subcu layers with interrupted #1 and 0 Vicryl sutures  and for the superficial layers with interrupted 2-0 Vicryl sutures, skin  closed with a running subcu stitch of 4-0 Vicryl.  Tincture of Benzoin and  Steri-Strips applied.  Then 4 x 4's affixed to the skin with Hypafix tape.  The patient was then returned to a supine position, reactivated, extubated,  and returned to the recovery room in satisfactory condition.  All instrument  and sponge counts were correct.                                               Kerrin Champagne, M.D.    JEN/MEDQ  D:  10/29/2002  T:  10/30/2002  Job:  [id number]

## 2010-11-30 NOTE — Op Note (Signed)
NAME:  [name], [name]                            ACCOUNT NO.:  [id number]   MEDICAL RECORD NO.:  [id number]                   PATIENT TYPE:  AMB   LOCATION:  DAY                                  FACILITY:  [facility]   PHYSICIAN:  Dionne Ano. Everlene Other, M.D.         DATE OF BIRTH:  Jun 09, 1930   DATE OF PROCEDURE:  04/22/2002  DATE OF DISCHARGE:                                 OPERATIVE REPORT   PREOPERATIVE DIAGNOSES:  1. 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  2. Right carpal tunnel syndrome.   POSTOPERATIVE DIAGNOSES:  1. 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  2. Right carpal tunnel syndrome.   SURGERY PERFORMED:  1. Removal of right trapezium (arthroplasty-like base of the thumb joint).  2. Tendon transfer of right thumb utilizing the abductor pollicis longus     digastric portion transferred to the first metacarpal and SER and back     upon itself (Zancolli tendon transfer/suspension).  3. One-third slip abductor pollicis longus tendon transfer to the SER and     back upon itself in multiple figure-of-eight throws Saddie Benders tendon     transfer/suspension).  4. Carpal tunnel release, right upper extremity.   SURGEON:  Dionne Ano. Amanda Pea, M.D.   ASSISTANTOttie Glazier. Wynona Neat, P.A.-C.   COMPLICATIONS:  None.   ANESTHESIA:  General with preoperative regional block placed.   DRAINS:  None.   COMPLICATIONS:  None.   TOURNIQUET TIME:  Less than 90 minutes.   INDICATIONS FOR PROCEDURE:  This patient is a very pleasant female, who  presents with the above-mentioned diagnoses.  I have counseled her in  regards to the risks and benefits of surgery, including the risk of  infection, bleeding, anesthesia, damage to normal structures, and failure of  the surgery to accomplish its intended goals of relieving symptoms and  restoring function.  With this in mind, she desires to proceed.  All  questions have been encouraged and  answered preoperatively.   OPERATIVE PROCEDURE IN DETAIL:  The patient was seen by myself and  anesthesia.  She was taken to the operative suite, had regional axillary  block and was then noted to be incomplete, and thus general anesthetic was  used.  The patient had the arm isolated and prepped and draped in the usual  sterile fashion about the right upper extremity and following this, a 1-inch  incision was made at the palmar region along the radial border of the ring  finger, beginning at Hilgenreiner's line and coursing proximally one inch.  Following this, dissection was carried down through the skin with a knife  blade.  Palmar fascia was incised along the length of the incision.  Additionally, the transverse carpal ligament was identified and released  __________ application.  Distal to proximal dissection was carried out until  adequate ligament was available, and then I slid the scissor tip  between the  transverse carpal ligament.  This released the proximal portion of the  transverse carpal ligament.  I identified this under full proximity  magnification.  It was completely released.  There were some radial  adhesions.  I gently teased them away from the nerve and the radial leaflet.  Following this, the area was irrigated and closed with interrupted Prolene.  Following this, incision was made over the first [facility] joint dorsally.  Interval between the APL and EPB was identified, and retractor was placed,  capsule incised, __________ elevator placed into the side of the bone.  The  bone was then excised piecemeal.  I did identify the radial artery and  superificial radial nerve branches and kept them out of harm's way at all  times.  Following this, the patient then underwent placement of a dorsal to  palmar drill hole each being interarticularly in line with the palmar beak  ligament.  This was enlarged to a 3.2 drill bit, irrigated copiously and  following this, I then irrigated  the wound and harvested the APL digastric  portion proximally utilizing a Taylor technique and a one-third portion of  the APL proper.  We then performed a tendon transfer of the APL digastric  portion.  The drill hole dorsal to palmar and around the FCR and then back  upon itself and the APL proper.  I endsewed this with 3-0 nonabsorbable  braided Ethibond suture.  The patient tolerated this well.  Following  placement of the suture, I then performed a second tendon transfer of the  APL one-half to one-third portion back upon the FCR and to itself in  multiple figure-of-eight throws in the form of a Weibel tendon  transfer/suspension.  The patient tolerated this well.  The tendon transfer  was accomplished without difficulty.  Following this, I then performed  closure of the capsule.  The thumb sat nicely, well-suspended, and looked  excellent.  During the course of the procedure, I should note I did perform  an FCR tenolysis and ensured that the neurovascular structures were  identified and protected at all times.  With this noted, I then irrigated  the wound copiously without the tourniquet.  There was excellent refill to  the hand, soft compartments, and the patient then underwent closure of the  wounds followed by placement of thumb spike splint.  She tolerated the  procedure well, and there were no complications.  She will be monitored in  the recovery room and then be observed overnight.  All questions have been  encouraged and answered.  We will participate in postoperative care.                                               Dionne Ano. Everlene Other, M.D.    Nash Mantis  D:  04/22/2002  T:  04/22/2002  Job:  [id number]

## 2010-11-30 NOTE — Discharge Summary (Signed)
[name], [name]                  ACCOUNT NO.:  [id number]   MEDICAL RECORD NO.:  [id number]          PATIENT TYPE:  INP   LOCATION:  5001                         FACILITY:  [facility]   PHYSICIAN:  Kerrin Champagne, M.D.   DATE OF BIRTH:  05/19/1930   DATE OF ADMISSION:  01/31/2009  DATE OF DISCHARGE:  02/02/2009                               DISCHARGE SUMMARY   ADMISSION DIAGNOSES:  1. Right C5-6 spondylosis with cord compression.  Degenerative disk      disease C5-6, degenerative disk disease C6-7 with a right C7      foraminal stenosis.  2. Coronary artery disease status post stents.  3. Hypertension.  4. Gastroesophageal reflux disease.  5. History of chronic bronchitis.  6. Right shoulder pain secondary to chronic rotator cuff tendinopathy.   DISCHARGE DIAGNOSES:  1. Right C5-6 spondylosis with cord compression.  Degenerative disk      disease C5-6, degenerative disk disease C6-7 with a right C7      foraminal stenosis.  2. Coronary artery disease status post stents.  3. Hypertension.  4. Gastroesophageal reflux disease.  5. History of chronic bronchitis.  6. Right shoulder pain secondary to chronic rotator cuff tendinopathy.   PROCEDURE:  On January 31, 2009 the patient underwent anterior cervical  diskectomy and fusion at C6-7 and C5-6 with resection of right and  central osteophyte disk complex at C5-6 with right foraminal  decompression at C6-7, placement of plates and screws and Alphatec  Trestle allograft.  This was performed by Dr. Otelia Sergeant under general  anesthesia, assisted by [name], PAC.   CONSULTATIONS:  None.   BRIEF HISTORY:  The patient is a 77 year old female with chronic and  progressive right upper extremity pain.  She has pain in the neck as  well.  She has noted weakness in the right upper extremity particularly  with the right shoulder.  She has been evaluated and noted to have  chronic rotator cuff tendonitis of the right shoulder treated with  Celebrex.  Her neck pain has not responded to conservative treatment and  she continues to complain of pain requiring narcotic analgesics.  It was  felt she would benefit from surgical intervention after studies  indicated severe cord compression secondary to degenerative disk disease  and spondylosis at the C5-6 and C6-7 levels.   BRIEF HOSPITAL COURSE:  The patient tolerated the procedure well.  Postop day #1 drain was removed.  She was able to begin ambulation.  Foley catheter was discontinued and she was able to void without  difficulty.  She was fitted with an Aspen collar to wear at all times  and a Philadelphia collar to wear for showers.  Her Celebrex was  restarted on February 01, 2009 secondary to increasing right shoulder pain.  The patient had an episode of feeling like she was fainting however,  blood pressure and other vital signs were stable.  On February 02, 2009 she  was ambulating in the hallway with the assistance of occupational and  physical therapy.  She was tolerating p.o. analgesics.  She was afebrile  and vital signs were stable.  She was felt ready for discharge to her  home in stable condition.   PLAN:  The patient was instructed to follow up with Dr. Otelia Sergeant 2 weeks  from surgery.  She will wear her Aspen collar at all times and utilize  her Philadelphia collar for showers.  Dressing change to be done daily  or as needed.  She will advance her diet when her sore throat is  resolved.  She will avoid overhead activity using her arms and no  overhead lifting.   PRESCRIPTIONS AT DISCHARGE:  Percocet 5/325 one to two every 4-6 hours  as needed for pain, Robaxin 500 mg one every 8 hours as needed for  spasm.  She will resume her home medications as taken prior to admission  and was given her medication reconciliation form with these  instructions.  She was advised to call should she have questions or  concerns prior to return office visit.   CONDITION ON DISCHARGE:   Stable.      Wende Neighbors, P.A.      Kerrin Champagne, M.D.  Electronically Signed    SMV/MEDQ  D:  02/09/2009  T:  02/09/2009  Job:  [id number]

## 2010-12-12 ENCOUNTER — Other Ambulatory Visit: Payer: Self-pay

## 2010-12-12 MED ORDER — LORAZEPAM 0.5 MG PO TABS: 0.5000 mg | ORAL_TABLET | Freq: Three times a day (TID) | ORAL | Status: DC | PRN

## 2010-12-12 NOTE — Telephone Encounter (Signed)
Faxed to [facility]

## 2011-01-02 ENCOUNTER — Encounter: Payer: Self-pay | Admitting: Family Medicine

## 2011-01-02 ENCOUNTER — Ambulatory Visit (INDEPENDENT_AMBULATORY_CARE_PROVIDER_SITE_OTHER): Payer: Medicare PPO | Admitting: Family Medicine

## 2011-01-02 VITALS — BP 126/78 | HR 63 | Temp 98.6°F | Wt 194.0 lb

## 2011-01-02 DIAGNOSIS — L03119 Cellulitis of unspecified part of limb: Secondary | ICD-10-CM

## 2011-01-02 DIAGNOSIS — L02419 Cutaneous abscess of limb, unspecified: Secondary | ICD-10-CM

## 2011-01-02 DIAGNOSIS — J40 Bronchitis, not specified as acute or chronic: Secondary | ICD-10-CM

## 2011-01-02 MED ORDER — DOXYCYCLINE HYCLATE 100 MG PO CAPS: 100.0000 mg | ORAL_CAPSULE | Freq: Two times a day (BID) | ORAL | Status: AC

## 2011-01-02 MED ORDER — HYDROCODONE-HOMATROPINE 5-1.5 MG/5ML PO SYRP: 5.0000 mL | ORAL_SOLUTION | ORAL | Status: AC | PRN

## 2011-01-02 NOTE — Progress Notes (Signed)
  Subjective:    Patient ID: [name], female    DOB: 10/29/1929, 77 y.o.   MRN: [id number]  HPI Here for one week of chest congestion and coughing up green sputum. No fever or chest pain. Also asks me to look at a wound on the left lower leg which she got from the corner of her car door about 2 months ago. She saw Dr. Amador Cunas in [place] and took a course of Keflex. The wound has stopped draining but it is still red and tender.    Review of Systems  Constitutional: Negative.   HENT: Negative.   Eyes: Negative.   Respiratory: Positive for cough and chest tightness.   Cardiovascular: Negative.   Skin: Positive for wound.       Objective:   Physical Exam  Constitutional: She appears well-developed and well-nourished.  HENT:  Right Ear: External ear normal.  Left Ear: External ear normal.  Nose: Nose normal.  Mouth/Throat: Oropharynx is clear and moist. No oropharyngeal exudate.  Eyes: Conjunctivae are normal.  Neck: No thyromegaly present.  Cardiovascular: Normal rate, regular rhythm, normal heart sounds and intact distal pulses.   Pulmonary/Chest: Effort normal. No respiratory distress. She has wheezes. She has rales. She exhibits no tenderness.  Lymphadenopathy:    She has no cervical adenopathy.  Skin:       Left lower lateral leg has a scabbed over laceration which is surrounded by a thin zone of erythema. Slightly tender           Assessment & Plan:  Doxycycline should cover both of these infections. Recheck prn

## 2011-01-21 ENCOUNTER — Telehealth: Payer: Self-pay | Admitting: Internal Medicine

## 2011-01-21 NOTE — Telephone Encounter (Signed)
Will you need to see her or will we be able to fill out form(once recv'd)

## 2011-01-21 NOTE — Telephone Encounter (Signed)
Spoke with pt- informed of dr. kwiatkowski's instructions 

## 2011-01-21 NOTE — Telephone Encounter (Signed)
OK for surgery- will give medical clearance since she has had recent ROV na carduiopulm status has been stable;  OK for ROV if patient desires

## 2011-01-21 NOTE — Telephone Encounter (Signed)
ok 

## 2011-01-21 NOTE — Telephone Encounter (Signed)
Pt called 7/9. Would like Dr. Kirtland Bouchard to clear her for another knee surgery (Total knee revision). She is seeing the surgeon this Thursday, so would like to know by then. Please call pt or if she needs to come in to see him, let her know that as well.

## 2011-01-22 ENCOUNTER — Other Ambulatory Visit: Payer: Self-pay | Admitting: Internal Medicine

## 2011-01-25 ENCOUNTER — Telehealth: Payer: Self-pay | Admitting: Internal Medicine

## 2011-01-25 NOTE — Telephone Encounter (Signed)
Per pt daughter calling, currently pt is under Dr. Prescott Gum care, yet pt would like to see another DR within [facility] for a 2nd opinion. Pt is debating have a serious surgery recommended by orthopedic. Pt thought during the last visit Dr. Gala Romney was not thorough or knowledgable. Please return call to pt daughter to advise and/or schedule appt.

## 2011-01-29 ENCOUNTER — Telehealth: Payer: Self-pay | Admitting: *Deleted

## 2011-01-29 NOTE — Telephone Encounter (Signed)
Pt's daughter calling wanting to see some one for s clearance for her mother--presently pt of dr bensimhons', but would like to see some one else--advised i would let dr bensimhon know and per haps he could recommend someone else in practice--daughter agrees--nt

## 2011-01-29 NOTE — Telephone Encounter (Signed)
Pt daughter calling back about getting a 2nd opinion. Pt is under the care of Dr. Gala Romney but would like another opinion from a MD in practice other that Bensimhon.

## 2011-01-29 NOTE — Telephone Encounter (Signed)
Will send to Dr Gala Romney to make sure it is OK with him for the patient to see someone else.

## 2011-01-30 NOTE — Telephone Encounter (Signed)
See phone mess dated 7/17

## 2011-01-30 NOTE — Telephone Encounter (Signed)
Left message for Robin to call back

## 2011-01-31 NOTE — Telephone Encounter (Signed)
[name] returning call back.

## 2011-01-31 NOTE — Telephone Encounter (Signed)
Spoke w/pt's daugher Zella Ball she and the pt both are unhappy and do not feel comfortable with Dr Gala Romney and request to change to a different provider apoligezed and sch pt to see Dr Clifton James on 8/7 at 10:30, she states pt is needing surgical clearance for a repeat knee replacement

## 2011-02-01 NOTE — Telephone Encounter (Signed)
That would be fine 

## 2011-02-11 ENCOUNTER — Other Ambulatory Visit: Payer: Self-pay | Admitting: Internal Medicine

## 2011-02-11 NOTE — Telephone Encounter (Signed)
Please advise - last seen 01/02/11 , last written 09/17/10 #[id number]

## 2011-02-11 NOTE — Telephone Encounter (Signed)
Called into pharmacy

## 2011-02-11 NOTE — Telephone Encounter (Signed)
ok 

## 2011-02-12 ENCOUNTER — Encounter: Payer: Self-pay | Admitting: Cardiovascular Disease

## 2011-02-19 ENCOUNTER — Ambulatory Visit (INDEPENDENT_AMBULATORY_CARE_PROVIDER_SITE_OTHER): Payer: Medicare PPO | Admitting: Cardiovascular Disease

## 2011-02-19 ENCOUNTER — Ambulatory Visit: Payer: Medicare PPO | Admitting: Internal Medicine

## 2011-02-19 ENCOUNTER — Encounter: Payer: Self-pay | Admitting: Cardiovascular Disease

## 2011-02-19 VITALS — BP 144/82 | HR 59 | Ht 64.0 in | Wt 191.0 lb

## 2011-02-19 DIAGNOSIS — I2510 Atherosclerotic heart disease of native coronary artery without angina pectoris: Secondary | ICD-10-CM

## 2011-02-19 DIAGNOSIS — R0789 Other chest pain: Secondary | ICD-10-CM | POA: Insufficient documentation

## 2011-02-19 DIAGNOSIS — Z01810 Encounter for preprocedural cardiovascular examination: Secondary | ICD-10-CM

## 2011-02-19 DIAGNOSIS — R079 Chest pain, unspecified: Secondary | ICD-10-CM

## 2011-02-19 NOTE — Assessment & Plan Note (Signed)
Known CAD. Recent exertional chest pain. Will arrange Lexiscan myoview to exclude ischemia. She cannot walk on a treadmill because of her knee pain.

## 2011-02-19 NOTE — Assessment & Plan Note (Signed)
Upcoming knee revision. Will make further recommendations after stress test.

## 2011-02-19 NOTE — Progress Notes (Signed)
History of Present Illness:[name] is a delightful 77 year old woman with a history of hypertension, obesity, diastolic heart failure, hyperlipidemia, severe osteo-arthirits s/p TKA x 4 (L x , R x 1) and coronary artery disease s/p BMS to RCA in 4/09.  LV function is normal. She has been followed in the past by Dr. Gala Romney. She is here today for cardiac follow up and also cardiac risk assessment prior to planned surgical procedure. At last visit in Febo 2011. Continued to have orthopnea. ECHO obtained. EF normal grade I diastolic HF. Moderate MR. Mild TR with normal pulm pressures. BNP midly elevated at 213. CXR: no CHF.   She is here today cardiac evaluation prior to planned revision of the right knee replacement. She tells me that she has been having chest pain when walking her dog a/w SOB and diaphoreiss. She is limited in her activities by her knee pain. No palpitations. She has had a few episodes of dizziness.   Past Medical History  Diagnosis Date  . ANEMIA 06/30/2007  . COLONIC POLYPS, HX OF 01/21/2007  . COLONIC POLYPS, HX OF 01/21/2007  . CORONARY ARTERY DISEASE 04/05/2008  . DEGENERATIVE JOINT DISEASE 05/19/2007  . DEPRESSION 02/07/2010  . DIASTOLIC HEART FAILURE, CHRONIC 02/27/2009  . DIVERTICULOSIS, COLON 01/21/2007  . DYSPNEA 11/13/2009  . Edema 11/13/2009  . GERD 01/21/2007  . HIATAL HERNIA WITH REFLUX 01/21/2007  . HYPERTENSION 01/21/2007  . LIMB PAIN 11/13/2009  . NECK PAIN, CHRONIC 02/13/2010  . Pure hypercholesterolemia 04/05/2008    Past Surgical History  Procedure Date  . Appendectomy   . Cholecystectomy   . Abdominal hysterectomy   . Lumbar laminectomy   . Total knee arthroplasty   . Rotator cuff repair   . Cardiac stent     Current Outpatient Prescriptions  Medication Sig Dispense Refill  . aspirin 325 MG tablet Take 325 mg by mouth daily.        . benazepril (LOTENSIN) 40 MG tablet Take 40 mg by mouth daily.        . CELEBREX 200 MG capsule Take 200 mg by mouth daily.       .  clotrimazole-betamethasone (LOTRISONE) cream Apply topically 2 (two) times daily.  30 g  0  . conjugated estrogens (PREMARIN) vaginal cream Place vaginally as needed.        . diphenoxylate-atropine (LOMOTIL) 2.5-0.025 MG/5ML liquid Take 5 mLs by mouth 4 (four) times daily as needed.  60 mL  1  . furosemide (LASIX) 20 MG tablet Take 20 mg by mouth daily as needed.        . gabapentin (NEURONTIN) 600 MG tablet Take 600 mg by mouth 3 (three) times daily.        Marland Kitchen HYDROcodone-acetaminophen (VICODIN) 5-500 MG per tablet take 1 tablet by mouth every 6 hours if needed  90 tablet  2  . LORazepam (ATIVAN) 0.5 MG tablet TAKE ONE TABLET BY MOUTH EVERY 8 HOURS AS NEEDED FOR ANXIETY  90 tablet  2  . metoprolol (LOPRESSOR) 50 MG tablet Take 50 mg by mouth 2 (two) times daily.        . nitroGLYCERIN (NITROSTAT) 0.4 MG SL tablet Place 0.4 mg under the tongue every 5 (five) minutes as needed.        Marland Kitchen omeprazole (PRILOSEC) 20 MG capsule Take 20 mg by mouth daily.        . sertraline (ZOLOFT) 50 MG tablet Take 50 mg by mouth daily.        Marland Kitchen  simvastatin (ZOCOR) 40 MG tablet Take 40 mg by mouth daily.        Marland Kitchen spironolactone (ALDACTONE) 25 MG tablet Take 25 mg by mouth daily.        . traZODone (DESYREL) 100 MG tablet Take by mouth. 1/2 at bedtime         No Known Allergies  History   Social History  . Marital Status: Married    Spouse Name: N/A    Number of Children: N/A  . Years of Education: N/A   Occupational History  . Not on file.   Social History Main Topics  . Smoking status: Former Smoker    Quit date: 07/16/1967  . Smokeless tobacco: Never Used  . Alcohol Use: No  . Drug Use: No  . Sexually Active: Not on file   Other Topics Concern  . Not on file   Social History Narrative  . No narrative on file    Family History  Problem Relation Age of Onset  . Osteoarthritis Mother   . Heart failure Mother     Review of Systems:  As stated in the HPI and otherwise negative.   BP 144/82   Pulse 59  Ht 5\' 4"  (1.626 m)  Wt 191 lb (86.637 kg)  BMI 32.79 kg/m2  Physical Examination: General: Well developed, well nourished, NAD HEENT: OP clear, mucus membranes moist SKIN: warm, dry. No rashes. Neuro: No focal deficits Musculoskeletal: Muscle strength 5/5 all ext Psychiatric: Mood and affect normal Neck: No JVD, no carotid bruits, no thyromegaly, no lymphadenopathy. Lungs:Clear bilaterally, no wheezes, rhonci, crackles Cardiovascular: Regular rate and rhythm. No murmurs, gallops or rubs. Abdomen:Soft. Bowel sounds present. Non-tender.  Extremities: No lower extremity edema. Pulses are 2 + in the bilateral DP/PT.  WJX:BJYNW bradycardia, rate 59 bpm.

## 2011-02-19 NOTE — Assessment & Plan Note (Signed)
See above

## 2011-02-19 NOTE — Patient Instructions (Signed)
Your physician recommends that you schedule a follow-up appointment in 2 weeks with [facility]  Your physician has requested that you have a lexiscan myoview. For further information please visit [email or web address] Please follow instruction sheet, as given.

## 2011-02-20 ENCOUNTER — Encounter: Payer: Self-pay | Admitting: *Deleted

## 2011-02-25 ENCOUNTER — Ambulatory Visit (INDEPENDENT_AMBULATORY_CARE_PROVIDER_SITE_OTHER): Payer: Medicare PPO | Admitting: Internal Medicine

## 2011-02-25 ENCOUNTER — Other Ambulatory Visit: Payer: Self-pay

## 2011-02-25 ENCOUNTER — Encounter: Payer: Self-pay | Admitting: Internal Medicine

## 2011-02-25 DIAGNOSIS — I10 Essential (primary) hypertension: Secondary | ICD-10-CM

## 2011-02-25 DIAGNOSIS — I509 Heart failure, unspecified: Secondary | ICD-10-CM

## 2011-02-25 DIAGNOSIS — M1990 Unspecified osteoarthritis, unspecified site: Secondary | ICD-10-CM

## 2011-02-25 DIAGNOSIS — I5032 Chronic diastolic (congestive) heart failure: Secondary | ICD-10-CM

## 2011-02-25 DIAGNOSIS — I2510 Atherosclerotic heart disease of native coronary artery without angina pectoris: Secondary | ICD-10-CM

## 2011-02-25 MED ORDER — FUROSEMIDE 20 MG PO TABS: 20.0000 mg | ORAL_TABLET | Freq: Every day | ORAL | Status: DC | PRN

## 2011-02-25 MED ORDER — CELECOXIB 200 MG PO CAPS: 200.0000 mg | ORAL_CAPSULE | Freq: Every day | ORAL | Status: DC

## 2011-02-25 MED ORDER — BENAZEPRIL HCL 40 MG PO TABS: 40.0000 mg | ORAL_TABLET | Freq: Every day | ORAL | Status: DC

## 2011-02-25 MED ORDER — TRAZODONE HCL 100 MG PO TABS: 50.0000 mg | ORAL_TABLET | Freq: Every day | ORAL | Status: DC

## 2011-02-25 MED ORDER — TRAZODONE HCL 100 MG PO TABS: 100.0000 mg | ORAL_TABLET | Freq: Every day | ORAL | Status: DC

## 2011-02-25 MED ORDER — SERTRALINE HCL 50 MG PO TABS: 50.0000 mg | ORAL_TABLET | Freq: Every day | ORAL | Status: DC

## 2011-02-25 MED ORDER — LORAZEPAM 0.5 MG PO TABS: 0.5000 mg | ORAL_TABLET | Freq: Three times a day (TID) | ORAL | Status: DC

## 2011-02-25 MED ORDER — METOPROLOL TARTRATE 50 MG PO TABS: 50.0000 mg | ORAL_TABLET | Freq: Two times a day (BID) | ORAL | Status: DC

## 2011-02-25 MED ORDER — HYDROCODONE-ACETAMINOPHEN 5-500 MG PO TABS: 1.0000 | ORAL_TABLET | Freq: Four times a day (QID) | ORAL | Status: DC | PRN

## 2011-02-25 MED ORDER — SIMVASTATIN 40 MG PO TABS: 40.0000 mg | ORAL_TABLET | Freq: Every day | ORAL | Status: DC

## 2011-02-25 MED ORDER — OMEPRAZOLE 20 MG PO CPDR: 20.0000 mg | DELAYED_RELEASE_CAPSULE | Freq: Every day | ORAL | Status: DC

## 2011-02-25 MED ORDER — SPIRONOLACTONE 25 MG PO TABS: 25.0000 mg | ORAL_TABLET | Freq: Every day | ORAL | Status: DC

## 2011-02-25 MED ORDER — GABAPENTIN 600 MG PO TABS: 600.0000 mg | ORAL_TABLET | Freq: Three times a day (TID) | ORAL | Status: DC

## 2011-02-25 NOTE — Progress Notes (Signed)
  Subjective:    Patient ID: [name], female    DOB: 04-25-1930, 77 y.o.   MRN: [id number]  HPI  77 year old patient who is seen today for followup of her hypertension. She has coronary artery disease. She is scheduled for a preoperative stress test tomorrow prior to a redo right total knee replacement surgery. She describes some vague chest tightness associated with profuse diaphoresis with minimal exertion. She has treated hypertension which has been stable she has a history of diastolic dysfunction and dyslipidemia. Denies any shortness of breath but is fairly young and active.    Review of Systems  Constitutional: Negative.   HENT: Negative for hearing loss, congestion, sore throat, rhinorrhea, dental problem, sinus pressure and tinnitus.   Eyes: Negative for pain, discharge and visual disturbance.  Respiratory: Positive for chest tightness. Negative for cough and shortness of breath.   Cardiovascular: Positive for leg swelling. Negative for chest pain and palpitations.  Gastrointestinal: Negative for nausea, vomiting, abdominal pain, diarrhea, constipation, blood in stool and abdominal distention.  Genitourinary: Negative for dysuria, urgency, frequency, hematuria, flank pain, vaginal bleeding, vaginal discharge, difficulty urinating, vaginal pain and pelvic pain.  Musculoskeletal: Negative for joint swelling, arthralgias and gait problem.  Skin: Negative for rash.  Neurological: Negative for dizziness, syncope, speech difficulty, weakness, numbness and headaches.  Hematological: Negative for adenopathy.  Psychiatric/Behavioral: Negative for behavioral problems, dysphoric mood and agitation. The patient is not nervous/anxious.        Objective:   Physical Exam  Constitutional: She is oriented to person, place, and time. She appears well-developed and well-nourished.       Obese. No distress Repeat blood pressure 140/80  HENT:  Head: Normocephalic.  Right Ear: External ear  normal.  Left Ear: External ear normal.  Mouth/Throat: Oropharynx is clear and moist.  Eyes: Conjunctivae and EOM are normal. Pupils are equal, round, and reactive to light.  Neck: Normal range of motion. Neck supple. No thyromegaly present.  Cardiovascular: Normal rate, regular rhythm, normal heart sounds and intact distal pulses.   Pulmonary/Chest: Effort normal and breath sounds normal.  Abdominal: Soft. Bowel sounds are normal. She exhibits no mass. There is no tenderness.  Musculoskeletal: Normal range of motion. She exhibits edema.       +2 edema  Lymphadenopathy:    She has no cervical adenopathy.  Neurological: She is alert and oriented to person, place, and time.  Skin: Skin is warm and dry. No rash noted.  Psychiatric: She has a normal mood and affect. Her behavior is normal.          Assessment & Plan:   Hypertension Coronary artery disease. Stress test per cardiology Osteoarthritis  Patient requires all medications refilled as was performed. Will recheck in 4 months

## 2011-02-25 NOTE — Patient Instructions (Signed)
Limit your sodium (Salt) intake  Please check your blood pressure on a regular basis.  If it is consistently greater than 150/90, please make an office appointment.  Return in 4 months for follow-up  

## 2011-02-25 NOTE — Telephone Encounter (Signed)
Clarification on sig done and re-efiled back to [facility]

## 2011-02-26 ENCOUNTER — Ambulatory Visit (HOSPITAL_COMMUNITY): Payer: Medicare PPO | Attending: Internal Medicine | Admitting: Radiology

## 2011-02-26 DIAGNOSIS — R0789 Other chest pain: Secondary | ICD-10-CM

## 2011-02-26 DIAGNOSIS — Z01810 Encounter for preprocedural cardiovascular examination: Secondary | ICD-10-CM | POA: Insufficient documentation

## 2011-02-26 DIAGNOSIS — I2510 Atherosclerotic heart disease of native coronary artery without angina pectoris: Secondary | ICD-10-CM

## 2011-02-26 HISTORY — PX: CARDIOVASCULAR STRESS TEST: SHX262

## 2011-02-26 MED ORDER — TECHNETIUM TC 99M TETROFOSMIN IV KIT
33.0000 | PACK | Freq: Once | INTRAVENOUS | Status: AC | PRN
  Administered 2011-02-26: 33 via INTRAVENOUS

## 2011-02-26 MED ORDER — REGADENOSON 0.4 MG/5ML IV SOLN
0.4000 mg | Freq: Once | INTRAVENOUS | Status: AC
  Administered 2011-02-26: 0.4 mg via INTRAVENOUS

## 2011-02-26 MED ORDER — TECHNETIUM TC 99M TETROFOSMIN IV KIT
11.0000 | PACK | Freq: Once | INTRAVENOUS | Status: AC | PRN
  Administered 2011-02-26: 11 via INTRAVENOUS

## 2011-02-26 NOTE — Progress Notes (Signed)
[name]  Cardiology Nuclear Med Mikal Wisman [place] is a 77 y.o. female [id number] 1930-05-01   Nuclear Med Background Indication for Stress Test:  Evaluation for Ischemia, PTCA/Stent Patency and Pending revision of (R) TKR by Dr. Dorene Grebe History:  Asthma, COPD, Emphysema and '09 PTCA/Stent-RCA; '11 Echo:EF=60% Cardiac Risk Factors: History of Smoking, Hypertension, Lipids, Obesity and TIA  Symptoms:  Chest Pain/Tightness with and without Exertion (last episode of chest discomfort was 2-3 days ago), Diaphoresis, Dizziness, DOE/SOB, Near Syncope/Syncope/Falls and Palpitations   Nuclear Pre-Procedure Caffeine/Decaff Intake:  None NPO After: 8:00pm   Lungs:  Clear.  O2 sat 95% on RA. IV 0.9% NS with Angio Cath:  20g  IV Site: R Wrist  IV Started by:  Cathlyn Parsons, RN  Chest Size (in):  42 Cup Size: B  Height: 5\' 4"  (1.626 m)  Weight:  193 lb (87.544 kg)  BMI:  Body mass index is 33.13 kg/(m^2). Tech Comments:  Metoprolol held x 24 hrs    Nuclear Med Study 1 or 2 day study: 1 day  Stress Test Type:  Lexiscan  Reading MD: Charlton Haws, MD  Order Authorizing Provider:  Verne Carrow, MD  Resting Radionuclide: Technetium 40m Tetrofosmin  Resting Radionuclide Dose: 11 mCi   Stress Radionuclide:  Technetium 62m Tetrofosmin  Stress Radionuclide Dose: 33 mCi           Stress Protocol Rest HR: 51 Stress HR: 67  Rest BP: 134/73 Stress BP: 142/78  Exercise Time (min): n/a METS: n/a   Predicted Max HR: 142 bpm % Max HR: 47.18 bpm Rate Pressure Product: 9514   Dose of Adenosine (mg):  n/a Dose of Lexiscan: 0.4 mg  Dose of Atropine (mg): n/a Dose of Dobutamine: n/a mcg/kg/min (at max HR)  Stress Test Technologist: Smiley Houseman, CMA-N  Nuclear Technologist:  Domenic Polite, CNMT     Rest Procedure:  Myocardial perfusion imaging was performed at rest 45 minutes following the  intravenous administration of Technetium 63m Tetrofosmin.  Rest ECG: Sinus bradycardia.  Stress Procedure:  The patient received IV Lexiscan 0.4 mg over 15-seconds.  Technetium 96m Tetrofosmin injected at 30-seconds.  There were no significant changes with Lexiscan; occasional PAC's and rare PVC's  Quantitative spect images were obtained after a 45 minute delay.  Stress ECG: No significant change from baseline ECG  QPS Raw Data Images:  Normal; no motion artifact; normal heart/lung ratio. Stress Images:  Normal homogeneous uptake in all areas of the myocardium. Rest Images:  Normal homogeneous uptake in all areas of the myocardium. Subtraction (SDS):  Normal Transient Ischemic Dilatation (Normal <1.22): 1.04 Lung/Heart Ratio (Normal <0.45):  .33  Quantitative Gated Spect Images QGS EDV:  93 ml QGS ESV:  31 ml QGS cine images:  NL LV Function; NL Wall Motion QGS EF: 67%  Impression Exercise Capacity:  Lexiscan with no exercise. BP Response:  Normal blood pressure response. Clinical Symptoms:  No chest pain. ECG Impression:  No significant ST segment change suggestive of ischemia. Comparison with Prior Nuclear Study: No previous nuclear study performed  Overall Impression:  Normal stress nuclear study.      Charlton Haws

## 2011-02-27 NOTE — Progress Notes (Signed)
Normal stress test. Can we let the patient know? Thanks, chris

## 2011-02-27 NOTE — Progress Notes (Signed)
Patient is aware of test/lab results.  

## 2011-02-27 NOTE — Progress Notes (Signed)
nuc med report routed to Dr. Amador Cunas & Dr. Clifton James 02/27/11 [name]

## 2011-03-05 ENCOUNTER — Ambulatory Visit: Payer: Medicare PPO | Admitting: Cardiovascular Disease

## 2011-03-15 ENCOUNTER — Ambulatory Visit (INDEPENDENT_AMBULATORY_CARE_PROVIDER_SITE_OTHER): Payer: Medicare PPO | Admitting: Internal Medicine

## 2011-03-15 ENCOUNTER — Encounter: Payer: Self-pay | Admitting: Internal Medicine

## 2011-03-15 DIAGNOSIS — L03116 Cellulitis of left lower limb: Secondary | ICD-10-CM

## 2011-03-15 DIAGNOSIS — I10 Essential (primary) hypertension: Secondary | ICD-10-CM

## 2011-03-15 DIAGNOSIS — L02419 Cutaneous abscess of limb, unspecified: Secondary | ICD-10-CM

## 2011-03-15 DIAGNOSIS — M1990 Unspecified osteoarthritis, unspecified site: Secondary | ICD-10-CM

## 2011-03-15 MED ORDER — CEPHALEXIN 500 MG PO CAPS: 500.0000 mg | ORAL_CAPSULE | Freq: Four times a day (QID) | ORAL | Status: AC

## 2011-03-15 NOTE — Progress Notes (Signed)
  Subjective:    Patient ID: [name], female    DOB: February 18, 1930, 77 y.o.   MRN: [id number]  HPI  and 77 year old patient who has a history of hypertension and osteoarthritis. She is scheduled for elective right knee surgery on September 18. For the past few days she has had increasing pain swelling and slight redness involving her left lateral lower leg. She has been treated for cellulitis in the past. Her cardiopulmonary status remained stable.    Review of Systems  Constitutional: Negative.   HENT: Negative for hearing loss, congestion, sore throat, rhinorrhea, dental problem, sinus pressure and tinnitus.   Eyes: Negative for pain, discharge and visual disturbance.  Respiratory: Negative for cough and shortness of breath.   Cardiovascular: Negative for chest pain, palpitations and leg swelling.  Gastrointestinal: Negative for nausea, vomiting, abdominal pain, diarrhea, constipation, blood in stool and abdominal distention.  Genitourinary: Negative for dysuria, urgency, frequency, hematuria, flank pain, vaginal bleeding, vaginal discharge, difficulty urinating, vaginal pain and pelvic pain.  Musculoskeletal: Negative for joint swelling, arthralgias and gait problem.  Skin: Positive for rash.  Neurological: Negative for dizziness, syncope, speech difficulty, weakness, numbness and headaches.  Hematological: Negative for adenopathy.  Psychiatric/Behavioral: Negative for behavioral problems, dysphoric mood and agitation. The patient is not nervous/anxious.        Objective:   Physical Exam  Constitutional: She appears well-developed and well-nourished. No distress.       Blood pressure 130/74  Musculoskeletal:       Examination left leg reveals some slight tenderness erythema and soft tissue swelling involving the left anterolateral lower leg          Assessment & Plan:   Early cellulitis left lower leg. Will place on antibiotic therapy elevate. She will call she develops fever  or worsening swelling pain or redness

## 2011-03-15 NOTE — Patient Instructions (Signed)
Keep left leg elevated as much as possible  Take your antibiotic as prescribed until ALL of it is gone, but stop if you develop a rash, swelling, or any side effects of the medication.  Contact our office as soon as possible if  there are side effects of the medication.  Call if you develop worsening pain redness or fever

## 2011-03-22 ENCOUNTER — Ambulatory Visit (HOSPITAL_COMMUNITY)
Admission: RE | Admit: 2011-03-22 | Discharge: 2011-03-22 | Disposition: A | Discharge status: Home / Self Care | Payer: Medicare PPO | Source: Ambulatory Visit | Attending: Orthopedic Surgery | Admitting: Orthopedic Surgery

## 2011-03-22 ENCOUNTER — Encounter (HOSPITAL_COMMUNITY)
Admission: RE | Admit: 2011-03-22 | Discharge: 2011-03-22 | Disposition: A | Discharge status: Home / Self Care | Payer: Medicare PPO | Source: Ambulatory Visit | Attending: Orthopedic Surgery | Admitting: Orthopedic Surgery

## 2011-03-22 ENCOUNTER — Other Ambulatory Visit (HOSPITAL_COMMUNITY): Payer: Self-pay | Admitting: Orthopedic Surgery

## 2011-03-22 DIAGNOSIS — M25569 Pain in unspecified knee: Secondary | ICD-10-CM

## 2011-03-22 DIAGNOSIS — Z01812 Encounter for preprocedural laboratory examination: Secondary | ICD-10-CM | POA: Insufficient documentation

## 2011-03-22 DIAGNOSIS — Z01818 Encounter for other preprocedural examination: Secondary | ICD-10-CM | POA: Insufficient documentation

## 2011-03-22 DIAGNOSIS — Z981 Arthrodesis status: Secondary | ICD-10-CM | POA: Insufficient documentation

## 2011-03-22 DIAGNOSIS — K449 Diaphragmatic hernia without obstruction or gangrene: Secondary | ICD-10-CM | POA: Insufficient documentation

## 2011-03-22 LAB — COMPREHENSIVE METABOLIC PANEL
ALT: 14 U/L (ref 0–35)
AST: 25 U/L (ref 0–37)
Albumin: 4.7 g/dL (ref 3.5–5.2)
Alkaline Phosphatase: 71 U/L (ref 39–117)
BUN: 39 mg/dL — ABNORMAL HIGH (ref 6–23)
CO2: 26 mEq/L (ref 19–32)
Calcium: 9.8 mg/dL (ref 8.4–10.5)
Chloride: 104 mEq/L (ref 96–112)
Creatinine, Ser: 1.79 mg/dL — ABNORMAL HIGH (ref 0.50–1.10)
GFR calc Af Amer: 33 mL/min — ABNORMAL LOW (ref >60)
GFR calc non Af Amer: 27 mL/min — ABNORMAL LOW (ref >60)
Glucose, Bld: 94 mg/dL (ref 70–99)
Potassium: 4.5 mEq/L (ref 3.5–5.1)
Sodium: 141 mEq/L (ref 135–145)
Total Bilirubin: 0.3 mg/dL (ref 0.3–1.2)
Total Protein: 8.4 g/dL — ABNORMAL HIGH (ref 6.0–8.3)

## 2011-03-22 LAB — DIFFERENTIAL
Basophils Absolute: 0 10*3/uL (ref 0.0–0.1)
Basophils Relative: 0 % (ref 0–1)
Eosinophils Absolute: 0.1 10*3/uL (ref 0.0–0.7)
Eosinophils Relative: 1 % (ref 0–5)
Lymphocytes Relative: 30 % (ref 12–46)
Lymphs Abs: 2.2 10*3/uL (ref 0.7–4.0)
Monocytes Absolute: 0.5 10*3/uL (ref 0.1–1.0)
Monocytes Relative: 7 % (ref 3–12)
Neutro Abs: 4.4 10*3/uL (ref 1.7–7.7)
Neutrophils Relative %: 61 % (ref 43–77)

## 2011-03-22 LAB — URINALYSIS, ROUTINE W REFLEX MICROSCOPIC
Bilirubin Urine: NEGATIVE
Glucose, UA: NEGATIVE mg/dL
Hgb urine dipstick: NEGATIVE
Ketones, ur: NEGATIVE mg/dL
Leukocytes, UA: NEGATIVE
Nitrite: NEGATIVE
Protein, ur: NEGATIVE mg/dL
Specific Gravity, Urine: 1.016 (ref 1.005–1.030)
Urobilinogen, UA: 0.2 mg/dL (ref 0.0–1.0)
pH: 5 (ref 5.0–8.0)

## 2011-03-22 LAB — CBC
HCT: 38.9 % (ref 36.0–46.0)
Hemoglobin: 12.4 g/dL (ref 12.0–15.0)
MCH: 27.5 pg (ref 26.0–34.0)
MCHC: 31.9 g/dL (ref 30.0–36.0)
MCV: 86.3 fL (ref 78.0–100.0)
Platelets: 161 10*3/uL (ref 150–400)
RBC: 4.51 MIL/uL (ref 3.87–5.11)
RDW: 14.7 % (ref 11.5–15.5)
WBC: 7.2 10*3/uL (ref 4.0–10.5)

## 2011-03-22 LAB — SURGICAL PCR SCREEN
MRSA, PCR: NEGATIVE
Staphylococcus aureus: POSITIVE — AB

## 2011-03-22 LAB — PROTIME-INR
INR: 1.1 (ref 0.00–1.49)
Prothrombin Time: 14.4 seconds (ref 11.6–15.2)

## 2011-03-22 LAB — SEDIMENTATION RATE: Sed Rate: 23 mm/hr — ABNORMAL HIGH (ref 0–22)

## 2011-03-22 LAB — C-REACTIVE PROTEIN: CRP: 0.26 mg/dL — ABNORMAL LOW (ref <0.60)

## 2011-03-24 LAB — URINE CULTURE
Colony Count: 60000
Culture  Setup Time: 201209071254

## 2011-04-02 ENCOUNTER — Inpatient Hospital Stay (HOSPITAL_COMMUNITY): Payer: Medicare HMO

## 2011-04-02 ENCOUNTER — Inpatient Hospital Stay (HOSPITAL_COMMUNITY)
Admission: RE | Admit: 2011-04-02 | Discharge: 2011-04-08 | DRG: 466 | Disposition: A | Discharge status: Skilled Nursing Facility | Payer: Medicare HMO | Source: Ambulatory Visit | Attending: Orthopedic Surgery | Admitting: Orthopedic Surgery

## 2011-04-02 DIAGNOSIS — M129 Arthropathy, unspecified: Secondary | ICD-10-CM | POA: Diagnosis present

## 2011-04-02 DIAGNOSIS — R110 Nausea: Secondary | ICD-10-CM | POA: Diagnosis not present

## 2011-04-02 DIAGNOSIS — N170 Acute kidney failure with tubular necrosis: Secondary | ICD-10-CM | POA: Diagnosis not present

## 2011-04-02 DIAGNOSIS — K219 Gastro-esophageal reflux disease without esophagitis: Secondary | ICD-10-CM | POA: Diagnosis present

## 2011-04-02 DIAGNOSIS — Z7901 Long term (current) use of anticoagulants: Secondary | ICD-10-CM

## 2011-04-02 DIAGNOSIS — D62 Acute posthemorrhagic anemia: Secondary | ICD-10-CM | POA: Diagnosis not present

## 2011-04-02 DIAGNOSIS — Z86718 Personal history of other venous thrombosis and embolism: Secondary | ICD-10-CM

## 2011-04-02 DIAGNOSIS — I5033 Acute on chronic diastolic (congestive) heart failure: Secondary | ICD-10-CM | POA: Diagnosis not present

## 2011-04-02 DIAGNOSIS — I509 Heart failure, unspecified: Secondary | ICD-10-CM | POA: Diagnosis not present

## 2011-04-02 DIAGNOSIS — R000 Tachycardia, unspecified: Secondary | ICD-10-CM | POA: Diagnosis not present

## 2011-04-02 DIAGNOSIS — T84498A Other mechanical complication of other internal orthopedic devices, implants and grafts, initial encounter: Principal | ICD-10-CM | POA: Diagnosis present

## 2011-04-02 DIAGNOSIS — E669 Obesity, unspecified: Secondary | ICD-10-CM | POA: Diagnosis present

## 2011-04-02 DIAGNOSIS — Z23 Encounter for immunization: Secondary | ICD-10-CM

## 2011-04-02 DIAGNOSIS — Z7982 Long term (current) use of aspirin: Secondary | ICD-10-CM

## 2011-04-02 DIAGNOSIS — I10 Essential (primary) hypertension: Secondary | ICD-10-CM | POA: Diagnosis present

## 2011-04-02 DIAGNOSIS — J4489 Other specified chronic obstructive pulmonary disease: Secondary | ICD-10-CM | POA: Diagnosis present

## 2011-04-02 DIAGNOSIS — J449 Chronic obstructive pulmonary disease, unspecified: Secondary | ICD-10-CM | POA: Diagnosis present

## 2011-04-02 DIAGNOSIS — R0902 Hypoxemia: Secondary | ICD-10-CM | POA: Diagnosis not present

## 2011-04-02 DIAGNOSIS — I959 Hypotension, unspecified: Secondary | ICD-10-CM | POA: Diagnosis not present

## 2011-04-02 DIAGNOSIS — I2510 Atherosclerotic heart disease of native coronary artery without angina pectoris: Secondary | ICD-10-CM | POA: Diagnosis present

## 2011-04-02 DIAGNOSIS — Y831 Surgical operation with implant of artificial internal device as the cause of abnormal reaction of the patient, or of later complication, without mention of misadventure at the time of the procedure: Secondary | ICD-10-CM | POA: Diagnosis present

## 2011-04-02 LAB — GRAM STAIN

## 2011-04-02 LAB — ABO/RH: ABO/RH(D): A POS

## 2011-04-02 LAB — GLUCOSE, CAPILLARY: Glucose-Capillary: 140 mg/dL — ABNORMAL HIGH (ref 70–99)

## 2011-04-03 ENCOUNTER — Inpatient Hospital Stay (HOSPITAL_COMMUNITY): Payer: Medicare HMO

## 2011-04-03 ENCOUNTER — Telehealth: Payer: Self-pay | Admitting: Cardiovascular Disease

## 2011-04-03 DIAGNOSIS — I059 Rheumatic mitral valve disease, unspecified: Secondary | ICD-10-CM

## 2011-04-03 LAB — CBC
HCT: 21.7 % — ABNORMAL LOW (ref 36.0–46.0)
HCT: 26.5 % — ABNORMAL LOW (ref 36.0–46.0)
Hemoglobin: 6.9 g/dL — CL (ref 12.0–15.0)
Hemoglobin: 8.6 g/dL — ABNORMAL LOW (ref 12.0–15.0)
MCH: 27.6 pg (ref 26.0–34.0)
MCH: 28.5 pg (ref 26.0–34.0)
MCHC: 31.8 g/dL (ref 30.0–36.0)
MCHC: 32.5 g/dL (ref 30.0–36.0)
MCV: 86.8 fL (ref 78.0–100.0)
MCV: 87.7 fL (ref 78.0–100.0)
Platelets: 118 10*3/uL — ABNORMAL LOW (ref 150–400)
Platelets: 106 10*3/uL — ABNORMAL LOW (ref 150–400)
RBC: 2.5 MIL/uL — ABNORMAL LOW (ref 3.87–5.11)
RBC: 3.02 MIL/uL — ABNORMAL LOW (ref 3.87–5.11)
RDW: 15.1 % (ref 11.5–15.5)
RDW: 15 % (ref 11.5–15.5)
WBC: 10.3 10*3/uL (ref 4.0–10.5)
WBC: 9.2 10*3/uL (ref 4.0–10.5)

## 2011-04-03 LAB — BASIC METABOLIC PANEL
BUN: 34 mg/dL — ABNORMAL HIGH (ref 6–23)
CO2: 23 mEq/L (ref 19–32)
Calcium: 7.7 mg/dL — ABNORMAL LOW (ref 8.4–10.5)
Chloride: 97 mEq/L (ref 96–112)
Creatinine, Ser: 2.14 mg/dL — ABNORMAL HIGH (ref 0.50–1.10)
GFR calc Af Amer: 27 mL/min — ABNORMAL LOW (ref >60)
GFR calc non Af Amer: 22 mL/min — ABNORMAL LOW (ref >60)
Glucose, Bld: 151 mg/dL — ABNORMAL HIGH (ref 70–99)
Potassium: 4.8 mEq/L (ref 3.5–5.1)
Sodium: 131 mEq/L — ABNORMAL LOW (ref 135–145)

## 2011-04-03 LAB — HEPATIC FUNCTION PANEL
ALT: 7 U/L (ref 0–35)
AST: 27 U/L (ref 0–37)
Albumin: 2.8 g/dL — ABNORMAL LOW (ref 3.5–5.2)
Alkaline Phosphatase: 43 U/L (ref 39–117)
Bilirubin, Direct: <0.1 mg/dL (ref 0.0–0.3)
Total Bilirubin: 0.2 mg/dL — ABNORMAL LOW (ref 0.3–1.2)
Total Protein: 5.2 g/dL — ABNORMAL LOW (ref 6.0–8.3)

## 2011-04-03 LAB — MAGNESIUM: Magnesium: 1.8 mg/dL (ref 1.5–2.5)

## 2011-04-03 LAB — PROTIME-INR
INR: 1.37 (ref 0.00–1.49)
Prothrombin Time: 17.1 seconds — ABNORMAL HIGH (ref 11.6–15.2)

## 2011-04-03 LAB — CARDIAC PANEL(CRET KIN+CKTOT+MB+TROPI)
CK, MB: 4.5 ng/mL — ABNORMAL HIGH (ref 0.3–4.0)
Relative Index: 2.5 (ref 0.0–2.5)
Total CK: 183 U/L — ABNORMAL HIGH (ref 7–177)
Troponin I: <0.3 ng/mL (ref <0.30)

## 2011-04-03 LAB — PREPARE RBC (CROSSMATCH)

## 2011-04-03 MED ORDER — TECHNETIUM TO 99M ALBUMIN AGGREGATED: 5.0000 | Freq: Once | INTRAVENOUS | Status: AC | PRN

## 2011-04-03 MED ORDER — XENON XE 133 GAS: 10.0000 | GAS_FOR_INHALATION | Freq: Once | RESPIRATORY_TRACT | Status: AC | PRN

## 2011-04-03 NOTE — Telephone Encounter (Signed)
Spoke with pt's daughter who reports pt had knee replacement surgery yesterday. Daughter reports pt had low blood pressure last evening. Daughter states cardiology has been consulted. I asked her to follow up with nurses in hospital for questions while pt hospitalized.  I then confirmed with Rosann Auerbach that pt is on list to be seen.

## 2011-04-03 NOTE — Telephone Encounter (Signed)
Patient daughter [name] calling,. Pt at hospital at [facility] in room # [phone]. Pt daughter has question / concerns. Blood pressure drop last night to 50-30.

## 2011-04-04 LAB — TYPE AND SCREEN
ABO/RH(D): A POS
Antibody Screen: NEGATIVE
Unit division: 0
Unit division: 0
Unit division: 0
Unit division: 0

## 2011-04-04 LAB — BASIC METABOLIC PANEL
BUN: 29 mg/dL — ABNORMAL HIGH (ref 6–23)
CO2: 27 mEq/L (ref 19–32)
Calcium: 8.3 mg/dL — ABNORMAL LOW (ref 8.4–10.5)
Chloride: 99 mEq/L (ref 96–112)
Creatinine, Ser: 1.52 mg/dL — ABNORMAL HIGH (ref 0.50–1.10)
GFR calc Af Amer: 40 mL/min — ABNORMAL LOW (ref >60)
GFR calc non Af Amer: 33 mL/min — ABNORMAL LOW (ref >60)
Glucose, Bld: 136 mg/dL — ABNORMAL HIGH (ref 70–99)
Potassium: 4.9 mEq/L (ref 3.5–5.1)
Sodium: 133 mEq/L — ABNORMAL LOW (ref 135–145)

## 2011-04-04 LAB — CBC
HCT: 32.1 % — ABNORMAL LOW (ref 36.0–46.0)
Hemoglobin: 10.6 g/dL — ABNORMAL LOW (ref 12.0–15.0)
MCH: 28.9 pg (ref 26.0–34.0)
MCHC: 33 g/dL (ref 30.0–36.0)
MCV: 87.5 fL (ref 78.0–100.0)
Platelets: 93 10*3/uL — ABNORMAL LOW (ref 150–400)
RBC: 3.67 MIL/uL — ABNORMAL LOW (ref 3.87–5.11)
RDW: 14.5 % (ref 11.5–15.5)
WBC: 9.4 10*3/uL (ref 4.0–10.5)

## 2011-04-04 LAB — RETICULOCYTES
RBC.: 3.67 MIL/uL — ABNORMAL LOW (ref 3.87–5.11)
Retic Count, Absolute: 51.4 10*3/uL (ref 19.0–186.0)
Retic Ct Pct: 1.4 % (ref 0.4–3.1)

## 2011-04-04 LAB — PROTIME-INR
INR: 2 — ABNORMAL HIGH (ref 0.00–1.49)
Prothrombin Time: 23 seconds — ABNORMAL HIGH (ref 11.6–15.2)

## 2011-04-05 ENCOUNTER — Inpatient Hospital Stay (HOSPITAL_COMMUNITY): Payer: Medicare HMO

## 2011-04-05 LAB — DIFFERENTIAL
Basophils Absolute: 0 10*3/uL (ref 0.0–0.1)
Basophils Relative: 0 % (ref 0–1)
Eosinophils Absolute: 0.1 10*3/uL (ref 0.0–0.7)
Eosinophils Relative: 1 % (ref 0–5)
Lymphocytes Relative: 8 % — ABNORMAL LOW (ref 12–46)
Lymphs Abs: 0.8 10*3/uL (ref 0.7–4.0)
Monocytes Absolute: 0.8 10*3/uL (ref 0.1–1.0)
Monocytes Relative: 8 % (ref 3–12)
Neutro Abs: 7.9 10*3/uL — ABNORMAL HIGH (ref 1.7–7.7)
Neutrophils Relative %: 83 % — ABNORMAL HIGH (ref 43–77)

## 2011-04-05 LAB — URINE MICROSCOPIC-ADD ON

## 2011-04-05 LAB — BASIC METABOLIC PANEL
BUN: 21 mg/dL (ref 6–23)
CO2: 28 mEq/L (ref 19–32)
Calcium: 8.7 mg/dL (ref 8.4–10.5)
Chloride: 97 mEq/L (ref 96–112)
Creatinine, Ser: 1.02 mg/dL (ref 0.50–1.10)
GFR calc Af Amer: >60 mL/min (ref >60)
GFR calc non Af Amer: 52 mL/min — ABNORMAL LOW (ref >60)
Glucose, Bld: 121 mg/dL — ABNORMAL HIGH (ref 70–99)
Potassium: 4.9 mEq/L (ref 3.5–5.1)
Sodium: 133 mEq/L — ABNORMAL LOW (ref 135–145)

## 2011-04-05 LAB — CBC
HCT: 32.7 % — ABNORMAL LOW (ref 36.0–46.0)
Hemoglobin: 10.9 g/dL — ABNORMAL LOW (ref 12.0–15.0)
MCH: 29.7 pg (ref 26.0–34.0)
MCHC: 33.3 g/dL (ref 30.0–36.0)
MCV: 89.1 fL (ref 78.0–100.0)
Platelets: 98 10*3/uL — ABNORMAL LOW (ref 150–400)
RBC: 3.67 MIL/uL — ABNORMAL LOW (ref 3.87–5.11)
RDW: 14.6 % (ref 11.5–15.5)
WBC: 9.6 10*3/uL (ref 4.0–10.5)

## 2011-04-05 LAB — URINALYSIS, ROUTINE W REFLEX MICROSCOPIC
Bilirubin Urine: NEGATIVE
Glucose, UA: NEGATIVE mg/dL
Ketones, ur: NEGATIVE mg/dL
Leukocytes, UA: NEGATIVE
Nitrite: NEGATIVE
Protein, ur: NEGATIVE mg/dL
Specific Gravity, Urine: 1.007 (ref 1.005–1.030)
Urobilinogen, UA: 0.2 mg/dL (ref 0.0–1.0)
pH: 5.5 (ref 5.0–8.0)

## 2011-04-05 LAB — PROTIME-INR
INR: 1.94 — ABNORMAL HIGH (ref 0.00–1.49)
Prothrombin Time: 22.5 seconds — ABNORMAL HIGH (ref 11.6–15.2)

## 2011-04-06 LAB — BASIC METABOLIC PANEL
BUN: 18 mg/dL (ref 6–23)
CO2: 33 mEq/L — ABNORMAL HIGH (ref 19–32)
Calcium: 8.7 mg/dL (ref 8.4–10.5)
Chloride: 94 mEq/L — ABNORMAL LOW (ref 96–112)
Creatinine, Ser: 0.79 mg/dL (ref 0.50–1.10)
GFR calc Af Amer: >60 mL/min (ref >60)
GFR calc non Af Amer: >60 mL/min (ref >60)
Glucose, Bld: 108 mg/dL — ABNORMAL HIGH (ref 70–99)
Potassium: 4.1 mEq/L (ref 3.5–5.1)
Sodium: 133 mEq/L — ABNORMAL LOW (ref 135–145)

## 2011-04-06 LAB — BODY FLUID CULTURE: Culture: NO GROWTH

## 2011-04-06 LAB — URINE CULTURE
Colony Count: NO GROWTH
Culture  Setup Time: 201209212004
Culture: NO GROWTH

## 2011-04-06 LAB — MAGNESIUM: Magnesium: 1.9 mg/dL (ref 1.5–2.5)

## 2011-04-06 LAB — PROTIME-INR
INR: 1.78 — ABNORMAL HIGH (ref 0.00–1.49)
Prothrombin Time: 21 seconds — ABNORMAL HIGH (ref 11.6–15.2)

## 2011-04-07 LAB — ANAEROBIC CULTURE

## 2011-04-07 LAB — CBC
HCT: 31.1 % — ABNORMAL LOW (ref 36.0–46.0)
Hemoglobin: 10.2 g/dL — ABNORMAL LOW (ref 12.0–15.0)
MCH: 29.2 pg (ref 26.0–34.0)
MCHC: 32.8 g/dL (ref 30.0–36.0)
MCV: 89.1 fL (ref 78.0–100.0)
Platelets: 127 10*3/uL — ABNORMAL LOW (ref 150–400)
RBC: 3.49 MIL/uL — ABNORMAL LOW (ref 3.87–5.11)
RDW: 13.7 % (ref 11.5–15.5)
WBC: 6.7 10*3/uL (ref 4.0–10.5)

## 2011-04-07 LAB — BASIC METABOLIC PANEL
BUN: 18 mg/dL (ref 6–23)
CO2: 39 mEq/L — ABNORMAL HIGH (ref 19–32)
Calcium: 8.6 mg/dL (ref 8.4–10.5)
Chloride: 94 mEq/L — ABNORMAL LOW (ref 96–112)
Creatinine, Ser: 0.78 mg/dL (ref 0.50–1.10)
GFR calc Af Amer: >60 mL/min (ref >60)
GFR calc non Af Amer: >60 mL/min (ref >60)
Glucose, Bld: 97 mg/dL (ref 70–99)
Potassium: 3.4 mEq/L — ABNORMAL LOW (ref 3.5–5.1)
Sodium: 136 mEq/L (ref 135–145)

## 2011-04-07 LAB — PROTIME-INR
INR: 1.56 — ABNORMAL HIGH (ref 0.00–1.49)
Prothrombin Time: 19 seconds — ABNORMAL HIGH (ref 11.6–15.2)

## 2011-04-08 LAB — CBC
HCT: 33.4 % — ABNORMAL LOW (ref 36.0–46.0)
Hemoglobin: 10.9 g/dL — ABNORMAL LOW (ref 12.0–15.0)
MCH: 29.1 pg (ref 26.0–34.0)
MCHC: 32.6 g/dL (ref 30.0–36.0)
MCV: 89.3 fL (ref 78.0–100.0)
Platelets: 154 10*3/uL (ref 150–400)
RBC: 3.74 MIL/uL — ABNORMAL LOW (ref 3.87–5.11)
RDW: 14.1 % (ref 11.5–15.5)
WBC: 7.2 10*3/uL (ref 4.0–10.5)

## 2011-04-08 LAB — BASIC METABOLIC PANEL
BUN: 18 mg/dL (ref 6–23)
CO2: 38 mEq/L — ABNORMAL HIGH (ref 19–32)
Calcium: 9.2 mg/dL (ref 8.4–10.5)
Chloride: 94 mEq/L — ABNORMAL LOW (ref 96–112)
Creatinine, Ser: 0.88 mg/dL (ref 0.50–1.10)
GFR calc Af Amer: >60 mL/min (ref >60)
GFR calc non Af Amer: >60 mL/min (ref >60)
Glucose, Bld: 103 mg/dL — ABNORMAL HIGH (ref 70–99)
Potassium: 4.1 mEq/L (ref 3.5–5.1)
Sodium: 136 mEq/L (ref 135–145)

## 2011-04-08 LAB — PROTIME-INR
INR: 1.69 — ABNORMAL HIGH (ref 0.00–1.49)
Prothrombin Time: 20.2 seconds — ABNORMAL HIGH (ref 11.6–15.2)

## 2011-04-09 LAB — HEPATIC FUNCTION PANEL
ALT: 18
AST: 27
Albumin: 2.7 — ABNORMAL LOW
Alkaline Phosphatase: 59
Bilirubin, Direct: <0.1
Total Bilirubin: 0.5
Total Protein: 6

## 2011-04-09 LAB — URINE CULTURE
Colony Count: 75000
Special Requests: NEGATIVE

## 2011-04-09 LAB — URINALYSIS, ROUTINE W REFLEX MICROSCOPIC
Glucose, UA: NEGATIVE
Hgb urine dipstick: NEGATIVE
Ketones, ur: NEGATIVE
Nitrite: NEGATIVE
Protein, ur: NEGATIVE
Specific Gravity, Urine: 1.035 — ABNORMAL HIGH
Urobilinogen, UA: 0.2
pH: 5.5

## 2011-04-09 LAB — BASIC METABOLIC PANEL
BUN: 15
BUN: 22
BUN: 22
CO2: 25
CO2: 25
CO2: 25
Calcium: 8.5
Calcium: 8.5
Calcium: 9.1
Chloride: 105
Chloride: 107
Chloride: 108
Creatinine, Ser: 0.95
Creatinine, Ser: 1.17
Creatinine, Ser: 1.42 — ABNORMAL HIGH
GFR calc Af Amer: 44 — ABNORMAL LOW
GFR calc Af Amer: 55 — ABNORMAL LOW
GFR calc Af Amer: >60
GFR calc non Af Amer: 36 — ABNORMAL LOW
GFR calc non Af Amer: 45 — ABNORMAL LOW
GFR calc non Af Amer: 57 — ABNORMAL LOW
Glucose, Bld: 100 — ABNORMAL HIGH
Glucose, Bld: 113 — ABNORMAL HIGH
Glucose, Bld: 117 — ABNORMAL HIGH
Potassium: 3.8
Potassium: 4.2
Potassium: 4.7
Sodium: 138
Sodium: 139
Sodium: 139

## 2011-04-09 LAB — URINE MICROSCOPIC-ADD ON

## 2011-04-09 LAB — CBC
HCT: 27.5 — ABNORMAL LOW
HCT: 28.4 — ABNORMAL LOW
HCT: 28.6 — ABNORMAL LOW
HCT: 30.8 — ABNORMAL LOW
Hemoglobin: 8.9 — ABNORMAL LOW
Hemoglobin: 9.4 — ABNORMAL LOW
Hemoglobin: 9.4 — ABNORMAL LOW
Hemoglobin: 9.7 — ABNORMAL LOW
MCHC: 31.5
MCHC: 32.4
MCHC: 32.8
MCHC: 32.9
MCV: 75.5 — ABNORMAL LOW
MCV: 76 — ABNORMAL LOW
MCV: 76.4 — ABNORMAL LOW
MCV: 77.1 — ABNORMAL LOW
Platelets: 202
Platelets: 210
Platelets: 212
Platelets: 231
RBC: 3.56 — ABNORMAL LOW
RBC: 3.74 — ABNORMAL LOW
RBC: 3.79 — ABNORMAL LOW
RBC: 4.03
RDW: 18.6 — ABNORMAL HIGH
RDW: 19 — ABNORMAL HIGH
RDW: 19.1 — ABNORMAL HIGH
RDW: 19.2 — ABNORMAL HIGH
WBC: 6.9
WBC: 7.4
WBC: 7.5
WBC: 8.1

## 2011-04-09 LAB — DIFFERENTIAL
Basophils Absolute: 0.1
Basophils Relative: 1
Eosinophils Absolute: 0.1
Eosinophils Relative: 2
Lymphocytes Relative: 18
Lymphs Abs: 1.3
Monocytes Absolute: 0.6
Monocytes Relative: 8
Neutro Abs: 5.3
Neutrophils Relative %: 71

## 2011-04-09 LAB — LIPID PANEL
Cholesterol: 121
HDL: 23 — ABNORMAL LOW
LDL Cholesterol: 79
Total CHOL/HDL Ratio: 5.3
Triglycerides: 95
VLDL: 19

## 2011-04-09 LAB — TROPONIN I: Troponin I: 0.33 — ABNORMAL HIGH

## 2011-04-09 LAB — CK TOTAL AND CKMB (NOT AT ARMC)
CK, MB: 6.1 — ABNORMAL HIGH
Relative Index: INVALID
Total CK: 73

## 2011-04-23 LAB — POCT URINALYSIS DIP (DEVICE)
Bilirubin Urine: NEGATIVE
Glucose, UA: NEGATIVE
Ketones, ur: NEGATIVE
Nitrite: NEGATIVE
Operator id: 247071
Protein, ur: NEGATIVE
Specific Gravity, Urine: 1.02
Urobilinogen, UA: 0.2
pH: 5.5

## 2011-04-23 LAB — I-STAT 8, (EC8 V) (CONVERTED LAB)
Acid-Base Excess: 2
BUN: 27 — ABNORMAL HIGH
Bicarbonate: 27.3 — ABNORMAL HIGH
Chloride: 107
Glucose, Bld: 84
HCT: 34 — ABNORMAL LOW
Hemoglobin: 11.6 — ABNORMAL LOW
Operator id: 247071
Potassium: 4.5
Sodium: 140
TCO2: 29
pCO2, Ven: 43 — ABNORMAL LOW
pH, Ven: 7.411 — ABNORMAL HIGH

## 2011-04-23 LAB — CBC
HCT: 29.9 — ABNORMAL LOW
Hemoglobin: 9.3 — ABNORMAL LOW
MCHC: 31.2
MCV: 75.9 — ABNORMAL LOW
Platelets: 228
RBC: 3.94
RDW: 16.4 — ABNORMAL HIGH
WBC: 7.1

## 2011-04-23 LAB — DIFFERENTIAL
Basophils Absolute: 0
Basophils Relative: 0
Eosinophils Absolute: 0.1 — ABNORMAL LOW
Eosinophils Relative: 2
Lymphocytes Relative: 30
Lymphs Abs: 2.1
Monocytes Absolute: 0.8
Monocytes Relative: 11
Neutro Abs: 4
Neutrophils Relative %: 57

## 2011-04-23 LAB — POCT I-STAT CREATININE
Creatinine, Ser: 1.2
Operator id: 247071

## 2011-04-23 LAB — OCCULT BLOOD X 1 CARD TO LAB, STOOL: Fecal Occult Bld: NEGATIVE

## 2011-04-24 NOTE — Discharge Summary (Signed)
  [name], [name] NO.:  [id number]  MEDICAL RECORD NO.:  [id number]  LOCATION:  4742                         FACILITY:  [facility]  PHYSICIAN:  Burnard Bunting, M.D.    DATE OF BIRTH:  05-21-1930  DATE OF ADMISSION:  04/02/2011 DATE OF DISCHARGE:  04/08/2011                              DISCHARGE SUMMARY   DISCHARGE DIAGNOSIS:  Left loose right total knee replacement.  SECONDARY DIAGNOSES: 1. Hypertension. 2. Coronary artery disease. 3. Chronic obstructive pulmonary disease.  OPERATIONS/CLINICAL PROCEDURES:  Right TKA revision performed April 02, 2011.  HOSPITAL COURSE:  [name] is a 77 year old female with loose TKA. She underwent TKA revision on April 02, 2011, tolerated procedure well, had acute blood loss anemia, was transfused 4 units of packed red blood cells, transferred to the step-down intensive care unit.  Medicine consultation was obtained.  The patient stabilized well, had no further issues, was started on Coumadin for DVT prophylaxis and therapy for range of motion and CPM for range of motion.  Cardiology consultation was also obtained due to the fact the patient was on the telemetry unit, had really no cardiac issues.  Incision was intact on postop day #3. She was able to ambulate in the hall with assistance and walker by postop day #4.  INR 1.78, hemoglobin 10.1 on the day of discharge.  The patient was tolerating therapy well.  At the time of discharge, she was at 60 degrees on her CPM machine.  The patient discharged in good condition to skilled nursing facility.  She will follow up with me in 7 days for suture removal.  Continue weightbearing as tolerated and range of motion as tolerated, right lower extremity.  She should use knee immobilizer to ambulate when she is weightbearing due to quad weakness.  DISCHARGE MEDICATIONS: 1. Colace 100 mg p.o. twice daily. 2. Robaxin 500 mg p.o. q.6-8 h. p.r.n. spasm. 3. Coumadin 5 mg by  mouth daily to INR of 2-2.5. 4. Albuterol inhaler 2 puffs inhaled every 4 hours as needed. 5. Benazepril 40 mg by mouth daily. 6. Calcium carbonate, vitamin D over-the-counter 1 by mouth daily. 7. Gabapentin 600 mg by mouth 3 times a day. 8. Hydrocodone 1 tablet by mouth every 6 hours as needed. 9. Lasix 20 mg p.o. daily. 10.Lomotil 1 tablet by mouth 4 times daily as needed 2.5 mg. 11.Lopressor 1 tablet by mouth twice daily. 12.Lorazepam 0.5 mg q.8 h. as needed for anxiety. 13.Nitroglycerin 0.4 mg p.r.n. chest pain. 14.Prilosec 1 capsule by mouth 20 mg p.o. daily. 15.Premarin 0.625 mg 1 g vaginally weekly as needed. 16.Sertraline 50 mg by mouth daily. 17.Simvastatin 40 mg p.o. by mouth every morning. 18.Spironolactone 1 tablet by mouth daily. 19.Trazodone 100 mg 1/2 tablet by mouth daily at bedtime as needed. 20.She will also be on KCl 20 mEq p.o. daily.     Burnard Bunting, M.D.     GSD/MEDQ  D:  04/08/2011  T:  04/08/2011  Job:  [id number]  Electronically Signed by Reece Agar.  DEAN M.D. on 04/24/2011 03:05:30 PM

## 2011-04-24 NOTE — Op Note (Signed)
[name], [name] NO.:  [id number]  MEDICAL RECORD NO.:  [id number]  LOCATION:  3701                         FACILITY:  [facility]  PHYSICIAN:  Burnard Bunting, M.D.    DATE OF BIRTH:  12-19-1929  DATE OF PROCEDURE:  04/02/2011 DATE OF DISCHARGE:                              OPERATIVE REPORT   PREOPERATIVE DIAGNOSIS:  Loose right total knee arthroplasty.  POSTOPERATIVE DIAGNOSIS:  Loose right total knee arthroplasty.  PROCEDURE:  Right loose right total knee arthroplasty revision.  SURGEON:  Marrianne Mood. August Saucer, MD.  ASSISTANT:  Wende Neighbors, PA.  ANESTHESIA:  General endotracheal.  ESTIMATED BLOOD LOSS:  250 mL.  DRAINS:  Hemovac x1.  TOTAL TOURNIQUET TIME:  120 minutes at 300 mmHg.  INDICATIONS:  [name] is an 77 year old patient with right knee pain, presents for operative management after explanation of risks and benefits.  PROCEDURE IN DETAIL:  The patient was brought to operating room where general endotracheal anesthesia was induced.  Preoperative antibiotics were administered.  Time-out was called.  Right leg was pre-scrubbed with alcohol and Betadine which was allowed to air dry then prepped with DuraPrep solution and draped in a sterile manner.  Collier Flowers was used to cover the operative field.  Leg was elevated.  Leg was elevated and exsanguinated with an Esmarch wrap.  Anterior approach to the knee was made.  Skin and subcutaneous tissues were sharply divided.  Median parapatellar approach was made and clear fluid was present.  This was sent for culture, which on gram-stain and had no organisms.  Overall, the femoral and tibial components were loose.  Synovectomy was performed anterior-posterior with care being taken avoid injury to the posterior neurovascular structures.  The cement was removed from both the tibia and the femur.  This is a painstaking process on the tibia.  Following cement removal, the tibia was prepared first by hand  reaming the tibial canal, then approaching to accept a sleeve.  The cuts were then made on the tibia was made.  There was a posterior-medial deficiency, which required augmentation.  A 5-mm cut was then made on the tibial side only with the correct rotation of the component noted.  At this time, following a tibial preparation with the distal stem and proximal sleeve broaching, the attention was directed towards the femur.  Bony defects were present distally and posteriorly and anteriorly.  Approximately, 6.5 femur was then closed with a size that it would require.  A distal femoral cut was then made after finding the canal removing excess cement.  The rotational alignment tray was then placed with the knee in 90 degrees of flexion and the knee rotation perpendicular to the tibia. At this time, a box-cut was made and chamfer cuts were made.  Trial reduction was then performed with the femur where in the canal was self prepared.  Each on the tibia and the femur, each had a 60 mm stem stability.  It was decided in order not to change the joint line mechanics to increase the tibial offset to 10 mm medially and 5 mm laterally and to add 4 mm of distal augmentation to the femur  along with 8 posterior laterally and 4 posterior medially.  With these trial components in position along with the 17-mm and 20-mm spacer, the patient had good stability in extension 30 degrees, 9 degrees of flexion without lift-off and good patellar tracking.  The patella itself had some wear but was reasonable enough to not require revision.  At this time, the knee joint was thoroughly irrigated.  Trial components were removed.  Tourniquet was released.  Bleeding points were encountered, which were controlled with electrocautery.  Thorough irrigation was performed.  True components were then cemented, first the tibia, then the femur with no adverse cardiopulmonary effect.  The cement was allowed to harden, 20-mm MB3  tray gave good stability and was the one chosen for the final implantation.  The incision was then again thoroughly irrigated.  The incision was then closed using #1 Vicryl suture, 0 Vicryl suture, 2-0 Vicryl suture, and skin staples.  Hemovac drain was placed and solution of Marcaine and morphine finally injected into the knee.  Bulky wrap was applied.  Velna Hatchet Vernon's assistance was required all times during the case for retraction of important neurovascular structures, limb positioning.  Her assistance was a medical necessity.  Again components inserted were DePuy tibial tray, rotating platform, MBT revision 2.5 cemented with a 13 x 60 mm modular cemented stem.  The cement restricter was also placed in the femur and tibia, size 2 in the tibia, size 5 in the femur.  Metaphyseal sleeve was also used in the tibia, 28 mm.  Again, wedges were used on the tibia, 10 mm medially, 5 mm laterally.  A size 5 cm cement restrictor was used on the femur, 2.5 femoral MBT component was utilized with then a 13 x 60 mm stem, 8 mm augment posterolaterally, 4 mm augment posterior medially, and 4 mm augment distally were used.  20 TC3 insert was used in the cement and an antibiotic was placed into the cement.     Burnard Bunting, M.D.     GSD/MEDQ  D:  04/02/2011  T:  04/02/2011  Job:  [id number]  Electronically Signed by Reece Agar.  DEAN M.D. on 04/24/2011 03:05:32 PM

## 2011-05-03 ENCOUNTER — Ambulatory Visit
Admission: RE | Admit: 2011-05-03 | Discharge: 2011-05-03 | Disposition: A | Discharge status: Home / Self Care | Payer: Medicare HMO | Source: Ambulatory Visit | Attending: Orthopedic Surgery | Admitting: Orthopedic Surgery

## 2011-05-03 ENCOUNTER — Other Ambulatory Visit: Payer: Self-pay | Admitting: Orthopedic Surgery

## 2011-05-03 DIAGNOSIS — R609 Edema, unspecified: Secondary | ICD-10-CM

## 2011-05-06 ENCOUNTER — Other Ambulatory Visit: Payer: Self-pay | Admitting: Orthopedic Surgery

## 2011-05-06 DIAGNOSIS — T148XXA Other injury of unspecified body region, initial encounter: Secondary | ICD-10-CM

## 2011-05-11 ENCOUNTER — Other Ambulatory Visit: Payer: Self-pay | Admitting: Internal Medicine

## 2011-05-12 ENCOUNTER — Other Ambulatory Visit: Payer: Self-pay | Admitting: Internal Medicine

## 2011-05-14 ENCOUNTER — Other Ambulatory Visit: Payer: Self-pay | Admitting: Internal Medicine

## 2011-06-11 ENCOUNTER — Other Ambulatory Visit: Payer: Self-pay | Admitting: Internal Medicine

## 2011-06-14 ENCOUNTER — Other Ambulatory Visit: Payer: Medicare HMO

## 2011-06-20 ENCOUNTER — Telehealth: Payer: Self-pay | Admitting: Internal Medicine

## 2011-06-20 NOTE — Telephone Encounter (Signed)
Please advise - we have 1 avilb opening tomorrow

## 2011-06-20 NOTE — Telephone Encounter (Signed)
Please call in am and check on status and ROV if desired

## 2011-06-20 NOTE — Telephone Encounter (Signed)
Pt is scheduled for Monday but is worried she might be coming down with bronchitis and pt wanted to be worked in before then. Please advise

## 2011-06-21 ENCOUNTER — Ambulatory Visit (INDEPENDENT_AMBULATORY_CARE_PROVIDER_SITE_OTHER): Payer: Medicare HMO | Admitting: Internal Medicine

## 2011-06-21 ENCOUNTER — Encounter: Payer: Self-pay | Admitting: Internal Medicine

## 2011-06-21 DIAGNOSIS — I2510 Atherosclerotic heart disease of native coronary artery without angina pectoris: Secondary | ICD-10-CM

## 2011-06-21 DIAGNOSIS — J069 Acute upper respiratory infection, unspecified: Secondary | ICD-10-CM

## 2011-06-21 DIAGNOSIS — R0602 Shortness of breath: Secondary | ICD-10-CM

## 2011-06-21 DIAGNOSIS — Z23 Encounter for immunization: Secondary | ICD-10-CM

## 2011-06-21 DIAGNOSIS — I10 Essential (primary) hypertension: Secondary | ICD-10-CM

## 2011-06-21 DIAGNOSIS — Z0000 Encounter for general adult medical examination without abnormal findings: Secondary | ICD-10-CM

## 2011-06-21 DIAGNOSIS — E7800 Pure hypercholesterolemia, unspecified: Secondary | ICD-10-CM

## 2011-06-21 MED ORDER — OMEPRAZOLE 20 MG PO CPDR: 20.0000 mg | DELAYED_RELEASE_CAPSULE | Freq: Every day | ORAL | Status: DC

## 2011-06-21 MED ORDER — FUROSEMIDE 20 MG PO TABS: 20.0000 mg | ORAL_TABLET | Freq: Every day | ORAL | Status: DC | PRN

## 2011-06-21 MED ORDER — TRAZODONE HCL 100 MG PO TABS: 50.0000 mg | ORAL_TABLET | Freq: Every day | ORAL | Status: DC

## 2011-06-21 MED ORDER — GABAPENTIN 600 MG PO TABS: 600.0000 mg | ORAL_TABLET | Freq: Three times a day (TID) | ORAL | Status: DC

## 2011-06-21 MED ORDER — AZITHROMYCIN 250 MG PO TABS: ORAL_TABLET | ORAL | Status: AC

## 2011-06-21 MED ORDER — LORAZEPAM 0.5 MG PO TABS: 0.5000 mg | ORAL_TABLET | Freq: Three times a day (TID) | ORAL | Status: DC

## 2011-06-21 MED ORDER — SPIRONOLACTONE 25 MG PO TABS: 25.0000 mg | ORAL_TABLET | Freq: Every day | ORAL | Status: DC

## 2011-06-21 MED ORDER — SERTRALINE HCL 50 MG PO TABS: 50.0000 mg | ORAL_TABLET | Freq: Every day | ORAL | Status: DC

## 2011-06-21 MED ORDER — METOPROLOL TARTRATE 50 MG PO TABS: 50.0000 mg | ORAL_TABLET | Freq: Two times a day (BID) | ORAL | Status: DC

## 2011-06-21 MED ORDER — SIMVASTATIN 40 MG PO TABS: 40.0000 mg | ORAL_TABLET | Freq: Every day | ORAL | Status: DC

## 2011-06-21 MED ORDER — HYDROCODONE-ACETAMINOPHEN 5-500 MG PO TABS: 1.0000 | ORAL_TABLET | Freq: Four times a day (QID) | ORAL | Status: DC | PRN

## 2011-06-21 MED ORDER — BENAZEPRIL HCL 40 MG PO TABS: 40.0000 mg | ORAL_TABLET | Freq: Every day | ORAL | Status: DC

## 2011-06-21 MED ORDER — ESOMEPRAZOLE MAGNESIUM 20 MG PO CPDR: 20.0000 mg | DELAYED_RELEASE_CAPSULE | Freq: Every day | ORAL | Status: DC

## 2011-06-21 NOTE — Patient Instructions (Signed)
Get plenty of rest, Drink lots of  clear liquids, and use Tylenol  for fever and discomfort.   Mucinex DM one twice daily  Take your antibiotic as prescribed until ALL of it is gone, but stop if you develop a rash, swelling, or any side effects of the medication.  Contact our office as soon as possible if  there are side effects of the medication.  Return in 3 months for follow-up

## 2011-06-21 NOTE — Progress Notes (Signed)
  Subjective:    Patient ID: [name], female    DOB: 01-08-1930, 77 y.o.   MRN: [id number]  HPI  77 year old patient who has a history of diastolic heart failure and coronary artery disease. She presents today with a four-day history of productive cough weakness and fever. Fever is more of a problem during the midnight. She has a productive cough yielding green sputum and occasional wheezing. She has had chest congestion. Associated symptoms include fatigue. No high fever or chills.    Review of Systems  Constitutional: Positive for fever and fatigue. Negative for chills.  HENT: Positive for congestion. Negative for hearing loss, sore throat, rhinorrhea, dental problem, sinus pressure and tinnitus.   Eyes: Negative for pain, discharge and visual disturbance.  Respiratory: Positive for cough and wheezing. Negative for shortness of breath.   Cardiovascular: Negative for chest pain, palpitations and leg swelling.  Gastrointestinal: Negative for nausea, vomiting, abdominal pain, diarrhea, constipation, blood in stool and abdominal distention.  Genitourinary: Negative for dysuria, urgency, frequency, hematuria, flank pain, vaginal bleeding, vaginal discharge, difficulty urinating, vaginal pain and pelvic pain.  Musculoskeletal: Negative for joint swelling, arthralgias and gait problem.  Skin: Negative for rash.  Neurological: Negative for dizziness, syncope, speech difficulty, weakness, numbness and headaches.  Hematological: Negative for adenopathy.  Psychiatric/Behavioral: Negative for behavioral problems, dysphoric mood and agitation. The patient is not nervous/anxious.        Objective:   Physical Exam  Constitutional: She is oriented to person, place, and time. She appears well-developed and well-nourished. No distress.       Blood pressure 160/80 O2 saturation 98 Pulse rate 72 Temperature 98.2  HENT:  Head: Normocephalic.  Right Ear: External ear normal.  Left Ear: External ear  normal.  Mouth/Throat: Oropharynx is clear and moist.  Eyes: Conjunctivae and EOM are normal. Pupils are equal, round, and reactive to light.  Neck: Normal range of motion. Neck supple. No thyromegaly present.  Cardiovascular: Normal rate, regular rhythm, normal heart sounds and intact distal pulses.   Pulmonary/Chest: Effort normal. No respiratory distress.       Coarse rhonchi. No active wheezing. O2 saturation 98%. Pulse rate 72  Abdominal: Soft. Bowel sounds are normal. She exhibits no mass. There is no tenderness.  Musculoskeletal: Normal range of motion. She exhibits no edema.  Lymphadenopathy:    She has no cervical adenopathy.  Neurological: She is alert and oriented to person, place, and time.  Skin: Skin is warm and dry. No rash noted.  Psychiatric: She has a normal mood and affect. Her behavior is normal.          Assessment & Plan:   Bronchitis History of diastolic heart failure. Appears well compensated Hypertension. Stable Coronary artery disease  We'll treat with a nebulizer treatment. Will treat with expectorants and azithromycin. She'll call if she develops  shortness of breath fever or chills

## 2011-06-21 NOTE — Telephone Encounter (Signed)
appt made for today 

## 2011-06-24 ENCOUNTER — Ambulatory Visit: Payer: Medicare PPO | Admitting: Internal Medicine

## 2011-07-29 ENCOUNTER — Other Ambulatory Visit: Payer: Self-pay | Admitting: Orthopedic Surgery

## 2011-07-29 DIAGNOSIS — IMO0002 Reserved for concepts with insufficient information to code with codable children: Secondary | ICD-10-CM

## 2011-07-31 ENCOUNTER — Ambulatory Visit (INDEPENDENT_AMBULATORY_CARE_PROVIDER_SITE_OTHER): Payer: Medicare HMO | Admitting: Family Medicine

## 2011-07-31 ENCOUNTER — Encounter: Payer: Self-pay | Admitting: Family Medicine

## 2011-07-31 VITALS — BP 150/78 | Temp 97.5°F | Wt 194.0 lb

## 2011-07-31 DIAGNOSIS — L02419 Cutaneous abscess of limb, unspecified: Secondary | ICD-10-CM

## 2011-07-31 DIAGNOSIS — L03116 Cellulitis of left lower limb: Secondary | ICD-10-CM

## 2011-07-31 DIAGNOSIS — L03119 Cellulitis of unspecified part of limb: Secondary | ICD-10-CM

## 2011-07-31 MED ORDER — CELECOXIB 200 MG PO CAPS: 200.0000 mg | ORAL_CAPSULE | Freq: Every day | ORAL | Status: DC

## 2011-07-31 MED ORDER — TRIAMCINOLONE ACETONIDE 0.1 % EX CREA: TOPICAL_CREAM | Freq: Two times a day (BID) | CUTANEOUS | Status: DC

## 2011-07-31 MED ORDER — ESOMEPRAZOLE MAGNESIUM 20 MG PO CPDR: 20.0000 mg | DELAYED_RELEASE_CAPSULE | Freq: Every day | ORAL | Status: DC

## 2011-07-31 MED ORDER — CEPHALEXIN 500 MG PO CAPS: 500.0000 mg | ORAL_CAPSULE | Freq: Three times a day (TID) | ORAL | Status: AC

## 2011-07-31 NOTE — Patient Instructions (Signed)
Cellulitis Cellulitis is an infection of the skin and the tissue beneath it. The area is typically red and tender. It is caused by germs (bacteria) (usually staph or strep) that enter the body through cuts or sores. Cellulitis most commonly occurs in the arms or lower legs.  HOME CARE INSTRUCTIONS   If you are given a prescription for medications which kill germs (antibiotics), take as directed until finished.   If the infection is on the arm or leg, keep the limb elevated as able.   Use a warm cloth several times per day to relieve pain and encourage healing.   See your caregiver for recheck of the infected site as directed if problems arise.   Only take over-the-counter or prescription medicines for pain, discomfort, or fever as directed by your caregiver.  SEEK MEDICAL CARE IF:   The area of redness (inflammation) is spreading, there are red streaks coming from the infected site, or if a part of the infection begins to turn dark in color.   The joint or bone underneath the infected skin becomes painful after the skin has healed.   The infection returns in the same or another area after it seems to have gone away.   A boil or bump swells up. This Alexa be an abscess.   New, unexplained problems such as pain or fever develop.  SEEK IMMEDIATE MEDICAL CARE IF:   You have a fever.   You or your child feels drowsy or lethargic.   There is vomiting, diarrhea, or lasting discomfort or feeling ill (malaise) with muscle aches and pains.  MAKE SURE YOU:   Understand these instructions.   Will watch your condition.   Will get help right away if you are not doing well or get worse.  Document Released: 04/10/2005 Document Revised: 03/13/2011 Document Reviewed: 02/17/2008 [employer] Patient Information 2012 [employer]. 

## 2011-07-31 NOTE — Progress Notes (Signed)
  Subjective:    Patient ID: [name], female    DOB: 1930/01/31, 78 y.o.   MRN: [id number]  HPI  Acute visit. Four-day history of recurrent left leg redness and tenderness. She had cellulitis earlier this year with similar symptoms now. She also has some itching. She denies any fever. Possible chills. Previously responded to Keflex. She has history of multiple medical problems including history of CAD, hypertension, heart failure.  History of TKRs.  Pain with ambulation and dependency  Review of Systems  Constitutional: Negative for fever and chills.  Skin: Positive for rash.  Hematological: Negative for adenopathy.       Objective:   Physical Exam  Constitutional: She appears well-developed and well-nourished.  Cardiovascular: Normal rate and regular rhythm.   Pulmonary/Chest: Effort normal and breath sounds normal. No respiratory distress. She has no wheezes. She has no rales.  Skin:       Patient has erythema involving most of the left lower leg. This stops about 8-10 cm inferior to the knee. Minimal edema. No pustules. No vesicles          Assessment & Plan:  Cellulitis left leg. Keflex 500 mg 3 times a day for 2 weeks. Elevation. Triamcinolone 0.1% cream for itching Refilled other medications including Celebrex and Nexium

## 2011-08-12 ENCOUNTER — Ambulatory Visit
Admission: RE | Admit: 2011-08-12 | Discharge: 2011-08-12 | Disposition: A | Discharge status: Home / Self Care | Payer: Medicare HMO | Source: Ambulatory Visit | Attending: Orthopedic Surgery | Admitting: Orthopedic Surgery

## 2011-08-12 DIAGNOSIS — IMO0002 Reserved for concepts with insufficient information to code with codable children: Secondary | ICD-10-CM

## 2011-08-13 ENCOUNTER — Telehealth: Payer: Self-pay | Admitting: Internal Medicine

## 2011-08-13 NOTE — Telephone Encounter (Signed)
Pts daughter called and said that [facility] needs all new scripts for all the pts meds. Need 90 day supply with refills. The Fax # [phone]. Pts daughter would like to be called asap as soon as these scripts have been sent. [facility] said that they have sent over numerous requests for these meds and have not gotten response. Pls fax in today.

## 2011-08-13 NOTE — Telephone Encounter (Signed)
Rx faxed to [facility].

## 2011-08-14 ENCOUNTER — Telehealth: Payer: Self-pay | Admitting: *Deleted

## 2011-08-14 NOTE — Telephone Encounter (Signed)
Daughter is calling back stating that [facility] is telling her that we are not sending back pt's prescriptions to be filled and wants to speak to someone to help.  Call sent to [facility].  Chart states they have been sent in already.

## 2011-08-14 NOTE — Telephone Encounter (Signed)
Spoke with daughter, and I confirmed with Elease Hashimoto that meds were faxed to [facility] last night. Daughter was particularly concerned about the Nexium and Celebrex. I couldn't say for sure which meds were called in. Please double check these. I told daughter to check with [facility] and let us know if there was anything else she needed.

## 2011-08-14 NOTE — Telephone Encounter (Signed)
Called to make pt's daughter aware that rx were faxed to [facility] yesterday.

## 2011-08-23 ENCOUNTER — Other Ambulatory Visit (HOSPITAL_COMMUNITY): Payer: Self-pay | Admitting: Orthopedic Surgery

## 2011-08-23 DIAGNOSIS — M25562 Pain in left knee: Secondary | ICD-10-CM

## 2011-09-03 ENCOUNTER — Ambulatory Visit (HOSPITAL_COMMUNITY)
Admission: RE | Admit: 2011-09-03 | Discharge: 2011-09-03 | Disposition: A | Discharge status: Home / Self Care | Payer: Medicare HMO | Source: Ambulatory Visit | Attending: Orthopedic Surgery | Admitting: Orthopedic Surgery

## 2011-09-03 ENCOUNTER — Encounter (HOSPITAL_COMMUNITY)
Admission: RE | Admit: 2011-09-03 | Discharge: 2011-09-03 | Disposition: A | Discharge status: Home / Self Care | Payer: Medicare HMO | Source: Ambulatory Visit | Attending: Orthopedic Surgery | Admitting: Orthopedic Surgery

## 2011-09-03 DIAGNOSIS — M25569 Pain in unspecified knee: Secondary | ICD-10-CM | POA: Insufficient documentation

## 2011-09-03 DIAGNOSIS — Z96659 Presence of unspecified artificial knee joint: Secondary | ICD-10-CM | POA: Insufficient documentation

## 2011-09-03 DIAGNOSIS — M25562 Pain in left knee: Secondary | ICD-10-CM

## 2011-09-03 DIAGNOSIS — M25469 Effusion, unspecified knee: Secondary | ICD-10-CM | POA: Insufficient documentation

## 2011-09-03 MED ORDER — TECHNETIUM TC 99M MEDRONATE IV KIT
23.9000 | PACK | Freq: Once | INTRAVENOUS | Status: AC | PRN
  Administered 2011-09-03: 23.9 via INTRAVENOUS

## 2011-09-18 ENCOUNTER — Other Ambulatory Visit: Payer: Self-pay

## 2011-09-18 ENCOUNTER — Ambulatory Visit (INDEPENDENT_AMBULATORY_CARE_PROVIDER_SITE_OTHER): Payer: Medicare HMO | Admitting: Internal Medicine

## 2011-09-18 ENCOUNTER — Encounter: Payer: Self-pay | Admitting: Internal Medicine

## 2011-09-18 DIAGNOSIS — I5032 Chronic diastolic (congestive) heart failure: Secondary | ICD-10-CM

## 2011-09-18 DIAGNOSIS — M1990 Unspecified osteoarthritis, unspecified site: Secondary | ICD-10-CM

## 2011-09-18 DIAGNOSIS — I2510 Atherosclerotic heart disease of native coronary artery without angina pectoris: Secondary | ICD-10-CM

## 2011-09-18 DIAGNOSIS — I509 Heart failure, unspecified: Secondary | ICD-10-CM

## 2011-09-18 DIAGNOSIS — I10 Essential (primary) hypertension: Secondary | ICD-10-CM

## 2011-09-18 MED ORDER — TRIAMCINOLONE ACETONIDE 0.1 % EX CREA: TOPICAL_CREAM | Freq: Two times a day (BID) | CUTANEOUS | Status: DC

## 2011-09-18 MED ORDER — TRAZODONE HCL 100 MG PO TABS: 50.0000 mg | ORAL_TABLET | Freq: Every day | ORAL | Status: DC

## 2011-09-18 MED ORDER — OMEPRAZOLE 40 MG PO CPDR: 40.0000 mg | DELAYED_RELEASE_CAPSULE | Freq: Every day | ORAL | Status: DC

## 2011-09-18 MED ORDER — GABAPENTIN 600 MG PO TABS: 600.0000 mg | ORAL_TABLET | Freq: Three times a day (TID) | ORAL | Status: DC

## 2011-09-18 MED ORDER — METOPROLOL TARTRATE 50 MG PO TABS: 50.0000 mg | ORAL_TABLET | Freq: Two times a day (BID) | ORAL | Status: DC

## 2011-09-18 MED ORDER — SERTRALINE HCL 50 MG PO TABS: 50.0000 mg | ORAL_TABLET | Freq: Every day | ORAL | Status: DC

## 2011-09-18 MED ORDER — BENAZEPRIL HCL 40 MG PO TABS: 40.0000 mg | ORAL_TABLET | Freq: Every day | ORAL | Status: DC

## 2011-09-18 MED ORDER — SPIRONOLACTONE 25 MG PO TABS: 25.0000 mg | ORAL_TABLET | Freq: Every day | ORAL | Status: DC

## 2011-09-18 MED ORDER — FUROSEMIDE 20 MG PO TABS: 20.0000 mg | ORAL_TABLET | Freq: Every day | ORAL | Status: DC | PRN

## 2011-09-18 MED ORDER — SIMVASTATIN 40 MG PO TABS: 40.0000 mg | ORAL_TABLET | Freq: Every day | ORAL | Status: DC

## 2011-09-18 MED ORDER — ESTROGENS, CONJUGATED 0.625 MG/GM VA CREA: TOPICAL_CREAM | VAGINAL | Status: DC | PRN

## 2011-09-18 NOTE — Patient Instructions (Signed)
Limit your sodium (Salt) intake  Return in 3 months for follow-up   

## 2011-09-20 ENCOUNTER — Encounter: Payer: Self-pay | Admitting: Internal Medicine

## 2011-09-20 NOTE — Progress Notes (Signed)
  Subjective:    Patient ID: [name], female    DOB: [birth year], 78 y.o.   MRN: [id number]  HPI 78 year old patient who is seen today for general followup. She has a history of hypertension which has a well-controlled. She has a history of coronary artery disease and diastolic heart failure. She denies any cardiopulmonary complaints. She does have osteoarthritis which has been fairly stable      Review of Systems  Constitutional: Negative.   HENT: Negative for hearing loss, congestion, sore throat, rhinorrhea, dental problem, sinus pressure and tinnitus.   Eyes: Negative for pain, discharge and visual disturbance.  Respiratory: Negative for cough and shortness of breath.   Cardiovascular: Negative for chest pain, palpitations and leg swelling.  Gastrointestinal: Negative for nausea, vomiting, abdominal pain, diarrhea, constipation, blood in stool and abdominal distention.  Genitourinary: Negative for dysuria, urgency, frequency, hematuria, flank pain, vaginal bleeding, vaginal discharge, difficulty urinating, vaginal pain and pelvic pain.  Musculoskeletal: Positive for back pain and arthralgias. Negative for joint swelling and gait problem.  Skin: Negative for rash.  Neurological: Negative for dizziness, syncope, speech difficulty, weakness, numbness and headaches.  Hematological: Negative for adenopathy.  Psychiatric/Behavioral: Negative for behavioral problems, dysphoric mood and agitation. The patient is not nervous/anxious.        Objective:   Physical Exam  Constitutional: She is oriented to person, place, and time. She appears well-developed and well-nourished.       Overweight no acute distress Blood pressure normal  HENT:  Head: Normocephalic.  Right Ear: External ear normal.  Left Ear: External ear normal.  Mouth/Throat: Oropharynx is clear and moist.  Eyes: Conjunctivae and EOM are normal. Pupils are equal, round, and reactive to light.  Neck: Normal range of motion.  Neck supple. No thyromegaly present.  Cardiovascular: Normal rate, regular rhythm, normal heart sounds and intact distal pulses.   Pulmonary/Chest: Effort normal and breath sounds normal.  Abdominal: Soft. Bowel sounds are normal. She exhibits no mass. There is no tenderness.  Musculoskeletal: Normal range of motion.  Lymphadenopathy:    She has no cervical adenopathy.  Neurological: She is alert and oriented to person, place, and time.  Skin: Skin is warm and dry. No rash noted.  Psychiatric: She has a normal mood and affect. Her behavior is normal.          Assessment & Plan:  Hypertension stable Posterior arthritis Coronary artery disease asymptomatic  Recheck 4 months

## 2012-01-20 ENCOUNTER — Ambulatory Visit (INDEPENDENT_AMBULATORY_CARE_PROVIDER_SITE_OTHER): Payer: Medicare HMO | Admitting: Internal Medicine

## 2012-01-20 ENCOUNTER — Encounter: Payer: Self-pay | Admitting: Internal Medicine

## 2012-01-20 VITALS — BP 126/84 | Wt 196.0 lb

## 2012-01-20 DIAGNOSIS — E7800 Pure hypercholesterolemia, unspecified: Secondary | ICD-10-CM

## 2012-01-20 DIAGNOSIS — M79609 Pain in unspecified limb: Secondary | ICD-10-CM

## 2012-01-20 DIAGNOSIS — M1990 Unspecified osteoarthritis, unspecified site: Secondary | ICD-10-CM

## 2012-01-20 DIAGNOSIS — I5032 Chronic diastolic (congestive) heart failure: Secondary | ICD-10-CM

## 2012-01-20 DIAGNOSIS — I10 Essential (primary) hypertension: Secondary | ICD-10-CM

## 2012-01-20 DIAGNOSIS — I2510 Atherosclerotic heart disease of native coronary artery without angina pectoris: Secondary | ICD-10-CM

## 2012-01-20 MED ORDER — TRAZODONE HCL 100 MG PO TABS: 50.0000 mg | ORAL_TABLET | Freq: Every day | ORAL | Status: DC

## 2012-01-20 MED ORDER — LORAZEPAM 0.5 MG PO TABS: 0.5000 mg | ORAL_TABLET | Freq: Three times a day (TID) | ORAL | Status: DC

## 2012-01-20 MED ORDER — HYDROCODONE-ACETAMINOPHEN 5-500 MG PO TABS: 1.0000 | ORAL_TABLET | Freq: Four times a day (QID) | ORAL | Status: DC | PRN

## 2012-01-20 MED ORDER — SIMVASTATIN 40 MG PO TABS: 40.0000 mg | ORAL_TABLET | Freq: Every day | ORAL | Status: DC

## 2012-01-20 MED ORDER — SPIRONOLACTONE 25 MG PO TABS: 25.0000 mg | ORAL_TABLET | Freq: Every day | ORAL | Status: DC

## 2012-01-20 MED ORDER — GABAPENTIN 600 MG PO TABS: 600.0000 mg | ORAL_TABLET | Freq: Three times a day (TID) | ORAL | Status: DC

## 2012-01-20 MED ORDER — METOPROLOL TARTRATE 50 MG PO TABS: 50.0000 mg | ORAL_TABLET | Freq: Two times a day (BID) | ORAL | Status: DC

## 2012-01-20 MED ORDER — BENAZEPRIL HCL 40 MG PO TABS: 40.0000 mg | ORAL_TABLET | Freq: Every day | ORAL | Status: DC

## 2012-01-20 MED ORDER — SERTRALINE HCL 50 MG PO TABS: 50.0000 mg | ORAL_TABLET | Freq: Every day | ORAL | Status: DC

## 2012-01-20 MED ORDER — FUROSEMIDE 20 MG PO TABS: 20.0000 mg | ORAL_TABLET | Freq: Every day | ORAL | Status: DC | PRN

## 2012-01-20 MED ORDER — CELECOXIB 200 MG PO CAPS: 200.0000 mg | ORAL_CAPSULE | Freq: Every day | ORAL | Status: DC

## 2012-01-20 NOTE — Progress Notes (Signed)
  Subjective:    Patient ID: [name], female    DOB: 1930/06/11, 78 y.o.   MRN: [id number]  HPI  F/u mult med problems  CAD-stable HTN C/o red L eye and L eg discomfort    Review of Systems  Constitutional: Negative.   HENT: Negative for hearing loss, congestion, sore throat, rhinorrhea, dental problem, sinus pressure and tinnitus.   Eyes: Positive for redness. Negative for pain, discharge and visual disturbance.  Respiratory: Negative for cough and shortness of breath.   Cardiovascular: Negative for chest pain, palpitations and leg swelling.  Gastrointestinal: Negative for nausea, vomiting, abdominal pain, diarrhea, constipation, blood in stool and abdominal distention.  Genitourinary: Negative for dysuria, urgency, frequency, hematuria, flank pain, vaginal bleeding, vaginal discharge, difficulty urinating, vaginal pain and pelvic pain.  Musculoskeletal: Negative for joint swelling, arthralgias and gait problem.       L leg pain  Skin: Negative for rash.  Neurological: Negative for dizziness, syncope, speech difficulty, weakness, numbness and headaches.  Hematological: Negative for adenopathy.  Psychiatric/Behavioral: Negative for behavioral problems, dysphoric mood and agitation. The patient is not nervous/anxious.        Objective:   Physical Exam  Constitutional: She is oriented to person, place, and time. She appears well-developed and well-nourished.  HENT:  Head: Normocephalic.  Right Ear: External ear normal.  Left Ear: External ear normal.  Mouth/Throat: Oropharynx is clear and moist.  Eyes: Conjunctivae and EOM are normal. Pupils are equal, round, and reactive to light.       L subconjuctival hemorrhage  Neck: Normal range of motion. Neck supple. No thyromegaly present.  Cardiovascular: Normal rate, regular rhythm, normal heart sounds and intact distal pulses.   Pulmonary/Chest: Effort normal and breath sounds normal.  Abdominal: Soft. Bowel sounds are normal.  She exhibits no mass. There is no tenderness.  Musculoskeletal: Normal range of motion.       L leg unremarkable  Lymphadenopathy:    She has no cervical adenopathy.  Neurological: She is alert and oriented to person, place, and time.  Skin: Skin is warm and dry. No rash noted.  Psychiatric: She has a normal mood and affect. Her behavior is normal.          Assessment & Plan:   L subconjunctival hemorrhage CAD- stable  HTN- stable  L left pain-  Muscular  All meds refilled

## 2012-01-20 NOTE — Patient Instructions (Addendum)
Limit your sodium (Salt) intake    It is important that you exercise regularly, at least 20 minutes 3 to 4 times per week.  If you develop chest pain or shortness of breath seek  medical attention.  You need to lose weight.  Consider a lower calorie diet and regular exercise. 

## 2012-03-24 ENCOUNTER — Other Ambulatory Visit: Payer: Self-pay | Admitting: Internal Medicine

## 2012-04-21 ENCOUNTER — Ambulatory Visit (INDEPENDENT_AMBULATORY_CARE_PROVIDER_SITE_OTHER): Payer: Medicare HMO | Admitting: Internal Medicine

## 2012-04-21 ENCOUNTER — Encounter: Payer: Self-pay | Admitting: Internal Medicine

## 2012-04-21 VITALS — BP 132/80 | Temp 97.4°F | Wt 194.0 lb

## 2012-04-21 DIAGNOSIS — M1990 Unspecified osteoarthritis, unspecified site: Secondary | ICD-10-CM

## 2012-04-21 DIAGNOSIS — I2510 Atherosclerotic heart disease of native coronary artery without angina pectoris: Secondary | ICD-10-CM

## 2012-04-21 DIAGNOSIS — Z23 Encounter for immunization: Secondary | ICD-10-CM

## 2012-04-21 NOTE — Patient Instructions (Signed)
Limit your sodium (Salt) intake  Return in 3 months for follow-up   

## 2012-04-21 NOTE — Progress Notes (Signed)
Subjective:    Patient ID: [name], female    DOB: 1929-12-14, 78 y.o.   MRN: [id number]  HPI  78 year old patient who is seen today for followup. She has a history of hypertension CAD chronic diastolic heart failure. She has some DJD in her main complaints are arthritic. She has been followed by orthopedics and does have back and knee pain. She has also been treated recently for a soft tissue infection involving her right medial thigh area. This has responded to antibiotic therapy  Past Medical History  Diagnosis Date  . ANEMIA 06/30/2007  . COLONIC POLYPS, HX OF 01/21/2007  . COLONIC POLYPS, HX OF 01/21/2007  . CORONARY ARTERY DISEASE 04/05/2008  . DEGENERATIVE JOINT DISEASE 05/19/2007  . DEPRESSION 02/07/2010  . DIASTOLIC HEART FAILURE, CHRONIC 02/27/2009  . DIVERTICULOSIS, COLON 01/21/2007  . DYSPNEA 11/13/2009  . Edema 11/13/2009  . GERD 01/21/2007  . HIATAL HERNIA WITH REFLUX 01/21/2007  . HYPERTENSION 01/21/2007  . LIMB PAIN 11/13/2009  . NECK PAIN, CHRONIC 02/13/2010  . Pure hypercholesterolemia 04/05/2008    History   Social History  . Marital Status: Married    Spouse Name: N/A    Number of Children: N/A  . Years of Education: N/A   Occupational History  . Not on file.   Social History Main Topics  . Smoking status: Former Smoker    Quit date: 07/16/1967  . Smokeless tobacco: Never Used  . Alcohol Use: No  . Drug Use: No  . Sexually Active: Not on file   Other Topics Concern  . Not on file   Social History Narrative  . No narrative on file    Past Surgical History  Procedure Date  . Appendectomy   . Cholecystectomy   . Abdominal hysterectomy   . Lumbar laminectomy   . Total knee arthroplasty   . Rotator cuff repair   . Cardiac stent   . Neck surgery     Family History  Problem Relation Age of Onset  . Osteoarthritis Mother   . Heart failure Mother     No Known Allergies  Current Outpatient Prescriptions on File Prior to Visit  Medication Sig Dispense  Refill  . aspirin 325 MG tablet Take 325 mg by mouth daily.        . benazepril (LOTENSIN) 40 MG tablet Take 1 tablet (40 mg total) by mouth daily.  90 tablet  6  . celecoxib (CELEBREX) 200 MG capsule Take 1 capsule (200 mg total) by mouth daily.  90 capsule  3  . conjugated estrogens (PREMARIN) vaginal cream Place vaginally as needed.  42.5 g  2  . furosemide (LASIX) 20 MG tablet Take 1 tablet (20 mg total) by mouth daily as needed.  90 tablet  6  . gabapentin (NEURONTIN) 600 MG tablet Take 1 tablet (600 mg total) by mouth 3 (three) times daily.  270 tablet  6  . HYDROcodone-acetaminophen (VICODIN) 5-500 MG per tablet Take 1 tablet by mouth every 6 (six) hours as needed for pain.  90 tablet  2  . LORazepam (ATIVAN) 0.5 MG tablet Take 1 tablet (0.5 mg total) by mouth every 8 (eight) hours.  90 tablet  2  . metoprolol (LOPRESSOR) 50 MG tablet Take 1 tablet (50 mg total) by mouth 2 (two) times daily.  180 tablet  3  . nitroGLYCERIN (NITROSTAT) 0.4 MG SL tablet Place 0.4 mg under the tongue every 5 (five) minutes as needed.        Marland Kitchen  omeprazole (PRILOSEC) 40 MG capsule Take 1 capsule (40 mg total) by mouth daily.  90 capsule  3  . sertraline (ZOLOFT) 50 MG tablet Take 1 tablet (50 mg total) by mouth daily.  90 tablet  6  . simvastatin (ZOCOR) 40 MG tablet Take 1 tablet (40 mg total) by mouth at bedtime.  90 tablet  6  . spironolactone (ALDACTONE) 25 MG tablet Take 1 tablet (25 mg total) by mouth daily.  90 tablet  6  . traZODone (DESYREL) 100 MG tablet Take 0.5 tablets (50 mg total) by mouth at bedtime. 1/2 at bedtime  90 tablet  6  . triamcinolone cream (KENALOG) 0.1 % APPLY TOPICALLY TWICE DAILY  30 g  3    BP 132/80  Temp 97.4 F (36.3 C) (Oral)  Wt 194 lb (87.998 kg)       Review of Systems  Constitutional: Negative.   HENT: Negative for hearing loss, congestion, sore throat, rhinorrhea, dental problem, sinus pressure and tinnitus.   Eyes: Negative for pain, discharge and visual  disturbance.  Respiratory: Negative for cough and shortness of breath.   Cardiovascular: Negative for chest pain, palpitations and leg swelling.  Gastrointestinal: Negative for nausea, vomiting, abdominal pain, diarrhea, constipation, blood in stool and abdominal distention.  Genitourinary: Negative for dysuria, urgency, frequency, hematuria, flank pain, vaginal bleeding, vaginal discharge, difficulty urinating, vaginal pain and pelvic pain.  Musculoskeletal: Positive for back pain, joint swelling and arthralgias. Negative for gait problem.  Skin: Negative for rash.  Neurological: Negative for dizziness, syncope, speech difficulty, weakness, numbness and headaches.  Hematological: Negative for adenopathy.  Psychiatric/Behavioral: Negative for behavioral problems, dysphoric mood and agitation. The patient is not nervous/anxious.        Objective:   Physical Exam  Constitutional: She is oriented to person, place, and time. She appears well-developed and well-nourished.       Blood pressure well controlled. Afebrile  HENT:  Head: Normocephalic.  Right Ear: External ear normal.  Left Ear: External ear normal.  Mouth/Throat: Oropharynx is clear and moist.  Eyes: Conjunctivae normal and EOM are normal. Pupils are equal, round, and reactive to light.  Neck: Normal range of motion. Neck supple. No thyromegaly present.  Cardiovascular: Normal rate, regular rhythm, normal heart sounds and intact distal pulses.   Pulmonary/Chest: Effort normal and breath sounds normal.  Abdominal: Soft. Bowel sounds are normal. She exhibits no mass. There is no tenderness.  Musculoskeletal: Normal range of motion.  Lymphadenopathy:    She has no cervical adenopathy.  Neurological: She is alert and oriented to person, place, and time.  Skin: Skin is warm and dry. No rash noted.       An approximate 5 cm soft tissue mass involving the right medial thigh. Nontender  Psychiatric: She has a normal mood and affect.  Her behavior is normal.          Assessment & Plan:   Osteoarthritis Hypertension CAD Chronic diastolic heart failure stable  Medications updated Orthopedic followup Recheck 3 months

## 2012-06-02 ENCOUNTER — Other Ambulatory Visit: Payer: Self-pay | Admitting: Internal Medicine

## 2012-06-02 NOTE — Telephone Encounter (Signed)
Ativan refill request Last filled 7/8 qty 90 2 refills.  VICODIN refill request last filled 7/8 qty 90 2 refills. Pt last seen on 10/8 ok to refill?

## 2012-06-02 NOTE — Telephone Encounter (Signed)
ok 

## 2012-06-02 NOTE — Telephone Encounter (Signed)
Meds phoned in.

## 2012-07-22 ENCOUNTER — Ambulatory Visit (INDEPENDENT_AMBULATORY_CARE_PROVIDER_SITE_OTHER): Payer: Medicare HMO | Admitting: Internal Medicine

## 2012-07-22 ENCOUNTER — Encounter: Payer: Self-pay | Admitting: Internal Medicine

## 2012-07-22 VITALS — BP 140/80 | HR 70 | Temp 97.8°F | Resp 20 | Wt 194.0 lb

## 2012-07-22 DIAGNOSIS — E7800 Pure hypercholesterolemia, unspecified: Secondary | ICD-10-CM

## 2012-07-22 DIAGNOSIS — K449 Diaphragmatic hernia without obstruction or gangrene: Secondary | ICD-10-CM

## 2012-07-22 DIAGNOSIS — I2510 Atherosclerotic heart disease of native coronary artery without angina pectoris: Secondary | ICD-10-CM

## 2012-07-22 DIAGNOSIS — I10 Essential (primary) hypertension: Secondary | ICD-10-CM

## 2012-07-22 MED ORDER — CELECOXIB 200 MG PO CAPS: 200.0000 mg | ORAL_CAPSULE | Freq: Every day | ORAL | Status: DC

## 2012-07-22 MED ORDER — SERTRALINE HCL 50 MG PO TABS: 50.0000 mg | ORAL_TABLET | Freq: Every day | ORAL | Status: DC

## 2012-07-22 MED ORDER — BENAZEPRIL HCL 40 MG PO TABS: 40.0000 mg | ORAL_TABLET | Freq: Every day | ORAL | Status: DC

## 2012-07-22 MED ORDER — OMEPRAZOLE 40 MG PO CPDR: 40.0000 mg | DELAYED_RELEASE_CAPSULE | Freq: Every day | ORAL | Status: DC

## 2012-07-22 MED ORDER — CELECOXIB 200 MG PO CAPS
200.0000 mg | ORAL_CAPSULE | Freq: Every day | ORAL | Status: DC
Start: 2012-07-22 — End: 2013-01-27

## 2012-07-22 MED ORDER — GABAPENTIN 600 MG PO TABS: 600.0000 mg | ORAL_TABLET | Freq: Three times a day (TID) | ORAL | Status: DC

## 2012-07-22 MED ORDER — TRAZODONE HCL 100 MG PO TABS: 50.0000 mg | ORAL_TABLET | Freq: Every day | ORAL | Status: DC

## 2012-07-22 MED ORDER — METOPROLOL TARTRATE 50 MG PO TABS: 50.0000 mg | ORAL_TABLET | Freq: Two times a day (BID) | ORAL | Status: DC

## 2012-07-22 MED ORDER — LORAZEPAM 0.5 MG PO TABS: 1.0000 mg | ORAL_TABLET | Freq: Four times a day (QID) | ORAL | Status: DC | PRN

## 2012-07-22 MED ORDER — FUROSEMIDE 20 MG PO TABS: 20.0000 mg | ORAL_TABLET | Freq: Every day | ORAL | Status: DC | PRN

## 2012-07-22 MED ORDER — HYDROCODONE-ACETAMINOPHEN 5-500 MG PO TABS: 1.0000 | ORAL_TABLET | Freq: Four times a day (QID) | ORAL | Status: DC | PRN

## 2012-07-22 MED ORDER — SIMVASTATIN 40 MG PO TABS: 40.0000 mg | ORAL_TABLET | Freq: Every day | ORAL | Status: DC

## 2012-07-22 MED ORDER — BACLOFEN 10 MG PO TABS: 10.0000 mg | ORAL_TABLET | Freq: Three times a day (TID) | ORAL | Status: DC

## 2012-07-22 MED ORDER — SPIRONOLACTONE 25 MG PO TABS: 25.0000 mg | ORAL_TABLET | Freq: Every day | ORAL | Status: DC

## 2012-07-22 NOTE — Progress Notes (Signed)
Subjective:    Patient ID: [name], female    DOB: 29-Jun-1930, 79 y.o.   MRN: [id number]  HPI  79 year old patient who is seen today for general followup. She has treated hypertension and coronary artery disease that have been stable. Her main complaint today is significant back and especially left knee pain. This has been chronic. She is on both analgesics as well as Celebrex. She has a history of gastroesophageal reflux disease she continues have some breakthrough symptoms at night. Symptoms are improved with a twice a day dosing of omeprazole. She requires medication refills. She has a history depression which has been fairly stable. Denies any exertional chest pain or nitroglycerin use. No shortness of breath.  Past Medical History  Diagnosis Date  . ANEMIA 06/30/2007  . COLONIC POLYPS, HX OF 01/21/2007  . COLONIC POLYPS, HX OF 01/21/2007  . CORONARY ARTERY DISEASE 04/05/2008  . DEGENERATIVE JOINT DISEASE 05/19/2007  . DEPRESSION 02/07/2010  . DIASTOLIC HEART FAILURE, CHRONIC 02/27/2009  . DIVERTICULOSIS, COLON 01/21/2007  . DYSPNEA 11/13/2009  . Edema 11/13/2009  . GERD 01/21/2007  . HIATAL HERNIA WITH REFLUX 01/21/2007  . HYPERTENSION 01/21/2007  . LIMB PAIN 11/13/2009  . NECK PAIN, CHRONIC 02/13/2010  . Pure hypercholesterolemia 04/05/2008    History   Social History  . Marital Status: Married    Spouse Name: N/A    Number of Children: N/A  . Years of Education: N/A   Occupational History  . Not on file.   Social History Main Topics  . Smoking status: Former Smoker    Quit date: 07/16/1967  . Smokeless tobacco: Never Used  . Alcohol Use: No  . Drug Use: No  . Sexually Active: Not on file   Other Topics Concern  . Not on file   Social History Narrative  . No narrative on file    Past Surgical History  Procedure Date  . Appendectomy   . Cholecystectomy   . Abdominal hysterectomy   . Lumbar laminectomy   . Total knee arthroplasty   . Rotator cuff repair   . Cardiac  stent   . Neck surgery     Family History  Problem Relation Age of Onset  . Osteoarthritis Mother   . Heart failure Mother     No Known Allergies  Current Outpatient Prescriptions on File Prior to Visit  Medication Sig Dispense Refill  . aspirin 325 MG tablet Take 325 mg by mouth daily.        . baclofen (LIORESAL) 10 MG tablet Take 10 mg by mouth 3 (three) times daily.      . benazepril (LOTENSIN) 40 MG tablet Take 1 tablet (40 mg total) by mouth daily.  90 tablet  6  . celecoxib (CELEBREX) 200 MG capsule Take 1 capsule (200 mg total) by mouth daily.  90 capsule  3  . conjugated estrogens (PREMARIN) vaginal cream Place vaginally as needed.  42.5 g  2  . furosemide (LASIX) 20 MG tablet Take 1 tablet (20 mg total) by mouth daily as needed.  90 tablet  6  . gabapentin (NEURONTIN) 600 MG tablet Take 1 tablet (600 mg total) by mouth 3 (three) times daily.  270 tablet  6  . HYDROcodone-acetaminophen (VICODIN) 5-500 MG per tablet TAKE ONE TABLET BY MOUTH EVERY 6 HOURS AS NEEDED FOR PAIN  90 tablet  1  . LORazepam (ATIVAN) 0.5 MG tablet TAKE ONE TABLET BY MOUTH EVERY 8 HOURS  90 tablet  1  .  metoprolol (LOPRESSOR) 50 MG tablet Take 1 tablet (50 mg total) by mouth 2 (two) times daily.  180 tablet  3  . nitroGLYCERIN (NITROSTAT) 0.4 MG SL tablet Place 0.4 mg under the tongue every 5 (five) minutes as needed.        Marland Kitchen omeprazole (PRILOSEC) 40 MG capsule Take 1 capsule (40 mg total) by mouth daily.  90 capsule  3  . sertraline (ZOLOFT) 50 MG tablet Take 1 tablet (50 mg total) by mouth daily.  90 tablet  6  . simvastatin (ZOCOR) 40 MG tablet Take 1 tablet (40 mg total) by mouth at bedtime.  90 tablet  6  . spironolactone (ALDACTONE) 25 MG tablet Take 1 tablet (25 mg total) by mouth daily.  90 tablet  6  . traZODone (DESYREL) 100 MG tablet Take 0.5 tablets (50 mg total) by mouth at bedtime. 1/2 at bedtime  90 tablet  6  . triamcinolone cream (KENALOG) 0.1 % APPLY TOPICALLY TWICE DAILY  30 g  3     BP 140/80  Pulse 70  Temp 97.8 F (36.6 C) (Oral)  Resp 20  Wt 194 lb (87.998 kg)  SpO2 97%       Review of Systems  Constitutional: Negative.   HENT: Negative for hearing loss, congestion, sore throat, rhinorrhea, dental problem, sinus pressure and tinnitus.   Eyes: Negative for pain, discharge and visual disturbance.  Respiratory: Negative for cough and shortness of breath.   Cardiovascular: Negative for chest pain, palpitations and leg swelling.  Gastrointestinal: Negative for nausea, vomiting, abdominal pain, diarrhea, constipation, blood in stool and abdominal distention.  Genitourinary: Negative for dysuria, urgency, frequency, hematuria, flank pain, vaginal bleeding, vaginal discharge, difficulty urinating, vaginal pain and pelvic pain.  Musculoskeletal: Positive for back pain, arthralgias and gait problem. Negative for joint swelling.  Skin: Negative for rash.  Neurological: Negative for dizziness, syncope, speech difficulty, weakness, numbness and headaches.  Hematological: Negative for adenopathy.  Psychiatric/Behavioral: Negative for behavioral problems, dysphoric mood and agitation. The patient is not nervous/anxious.        Objective:   Physical Exam  Constitutional: She is oriented to person, place, and time. She appears well-developed and well-nourished.  HENT:  Head: Normocephalic.  Right Ear: External ear normal.  Left Ear: External ear normal.  Mouth/Throat: Oropharynx is clear and moist.  Eyes: Conjunctivae normal and EOM are normal. Pupils are equal, round, and reactive to light.  Neck: Normal range of motion. Neck supple. No thyromegaly present.  Cardiovascular: Normal rate, regular rhythm, normal heart sounds and intact distal pulses.   Pulmonary/Chest: Effort normal and breath sounds normal.  Abdominal: Soft. Bowel sounds are normal. She exhibits no mass. There is no tenderness.  Musculoskeletal: Normal range of motion.  Lymphadenopathy:    She  has no cervical adenopathy.  Neurological: She is alert and oriented to person, place, and time.  Skin: Skin is warm and dry. No rash noted.  Psychiatric: She has a normal mood and affect. Her behavior is normal.          Assessment & Plan:   Hypertension stable Coronary artery disease stable Osteoarthritis with chronic back and left knee pain. We'll continue Celebrex and analgesics. Medications refilled GERD. We'll give a trial of  DExilant

## 2012-07-22 NOTE — Patient Instructions (Signed)
Limit your sodium (Salt) intake  Please check your blood pressure on a regular basis.  If it is consistently greater than 150/90, please make an office appointment.  Return in 6 months for follow-up   

## 2012-08-05 ENCOUNTER — Other Ambulatory Visit: Payer: Self-pay | Admitting: Internal Medicine

## 2012-08-05 MED ORDER — HYDROCODONE-ACETAMINOPHEN 5-325 MG PO TABS: 1.0000 | ORAL_TABLET | Freq: Four times a day (QID) | ORAL | Status: DC | PRN

## 2012-08-05 NOTE — Telephone Encounter (Signed)
Pt needs refill of HYDROcodone-acetaminophen (VICODIN) 5-500 MG per tablet. But they do not make the 500 mg anymore. Pharm states they have requested the new script info of 5-325mg , but have not heard back from [place]. Pt requested this script on 07/22/12. Pls advise. Pharm: [facility]/ [place] Pt has never gotten this script from [facility] Pt is not sure [facility] would even fill a script for HYDROCODONE.

## 2012-08-05 NOTE — Telephone Encounter (Signed)
Spoke to pt told her called new Rx in for Hydrocodone 5-325 mg tablet to pharmacy. Pt verbalized understanding.

## 2012-10-20 ENCOUNTER — Other Ambulatory Visit: Payer: Self-pay | Admitting: Internal Medicine

## 2013-01-19 ENCOUNTER — Encounter: Payer: Self-pay | Admitting: Internal Medicine

## 2013-01-19 ENCOUNTER — Ambulatory Visit (INDEPENDENT_AMBULATORY_CARE_PROVIDER_SITE_OTHER): Payer: Medicare HMO | Admitting: Internal Medicine

## 2013-01-19 VITALS — BP 152/80 | HR 58 | Temp 97.6°F | Resp 20 | Ht 63.5 in | Wt 188.0 lb

## 2013-01-19 DIAGNOSIS — I2510 Atherosclerotic heart disease of native coronary artery without angina pectoris: Secondary | ICD-10-CM

## 2013-01-19 DIAGNOSIS — K219 Gastro-esophageal reflux disease without esophagitis: Secondary | ICD-10-CM

## 2013-01-19 DIAGNOSIS — E7800 Pure hypercholesterolemia, unspecified: Secondary | ICD-10-CM

## 2013-01-19 DIAGNOSIS — Z0000 Encounter for general adult medical examination without abnormal findings: Secondary | ICD-10-CM

## 2013-01-19 DIAGNOSIS — D649 Anemia, unspecified: Secondary | ICD-10-CM

## 2013-01-19 DIAGNOSIS — I10 Essential (primary) hypertension: Secondary | ICD-10-CM

## 2013-01-19 DIAGNOSIS — E785 Hyperlipidemia, unspecified: Secondary | ICD-10-CM

## 2013-01-19 DIAGNOSIS — F329 Major depressive disorder, single episode, unspecified: Secondary | ICD-10-CM

## 2013-01-19 DIAGNOSIS — I5032 Chronic diastolic (congestive) heart failure: Secondary | ICD-10-CM

## 2013-01-19 LAB — COMPREHENSIVE METABOLIC PANEL
ALT: 13 U/L (ref 0–35)
AST: 22 U/L (ref 0–37)
Albumin: 3.9 g/dL (ref 3.5–5.2)
Alkaline Phosphatase: 54 U/L (ref 39–117)
BUN: 35 mg/dL — ABNORMAL HIGH (ref 6–23)
CO2: 26 mEq/L (ref 19–32)
Calcium: 9.1 mg/dL (ref 8.4–10.5)
Chloride: 107 mEq/L (ref 96–112)
Creatinine, Ser: 1.5 mg/dL — ABNORMAL HIGH (ref 0.4–1.2)
GFR: 35.16 mL/min — ABNORMAL LOW (ref >60.00)
Glucose, Bld: 94 mg/dL (ref 70–99)
Potassium: 5.3 mEq/L — ABNORMAL HIGH (ref 3.5–5.1)
Sodium: 140 mEq/L (ref 135–145)
Total Bilirubin: 0.3 mg/dL (ref 0.3–1.2)
Total Protein: 7.2 g/dL (ref 6.0–8.3)

## 2013-01-19 LAB — CBC WITH DIFFERENTIAL/PLATELET
Basophils Absolute: 0 10*3/uL (ref 0.0–0.1)
Basophils Relative: 0.4 % (ref 0.0–3.0)
Eosinophils Absolute: 0.1 10*3/uL (ref 0.0–0.7)
Eosinophils Relative: 1.5 % (ref 0.0–5.0)
HCT: 27.6 % — ABNORMAL LOW (ref 36.0–46.0)
Hemoglobin: 8.8 g/dL — ABNORMAL LOW (ref 12.0–15.0)
Lymphocytes Relative: 24.9 % (ref 12.0–46.0)
Lymphs Abs: 1.6 10*3/uL (ref 0.7–4.0)
MCHC: 31.8 g/dL (ref 30.0–36.0)
MCV: 76.3 fl — ABNORMAL LOW (ref 78.0–100.0)
Monocytes Absolute: 0.6 10*3/uL (ref 0.1–1.0)
Monocytes Relative: 8.9 % (ref 3.0–12.0)
Neutro Abs: 4.2 10*3/uL (ref 1.4–7.7)
Neutrophils Relative %: 64.3 % (ref 43.0–77.0)
Platelets: 164 10*3/uL (ref 150.0–400.0)
RBC: 3.62 Mil/uL — ABNORMAL LOW (ref 3.87–5.11)
RDW: 17.4 % — ABNORMAL HIGH (ref 11.5–14.6)
WBC: 6.5 10*3/uL (ref 4.5–10.5)

## 2013-01-19 LAB — LIPID PANEL
Cholesterol: 130 mg/dL (ref 0–200)
HDL: 33.6 mg/dL — ABNORMAL LOW (ref >39.00)
LDL Cholesterol: 79 mg/dL (ref 0–99)
Total CHOL/HDL Ratio: 4
Triglycerides: 87 mg/dL (ref 0.0–149.0)
VLDL: 17.4 mg/dL (ref 0.0–40.0)

## 2013-01-19 LAB — TSH: TSH: 1.91 u[IU]/mL (ref 0.35–5.50)

## 2013-01-19 MED ORDER — HYDROCODONE-ACETAMINOPHEN 5-325 MG PO TABS: ORAL_TABLET | ORAL | Status: DC

## 2013-01-19 MED ORDER — LORAZEPAM 0.5 MG PO TABS: 0.5000 mg | ORAL_TABLET | Freq: Three times a day (TID) | ORAL | Status: DC | PRN

## 2013-01-19 NOTE — Progress Notes (Signed)
Patient ID: [name], female   DOB: May 21, 1930, 79 y.o.   MRN: [id number]

## 2013-01-19 NOTE — Patient Instructions (Signed)
Limit your sodium (Salt) intake    It is important that you exercise regularly, at least 20 minutes 3 to 4 times per week.  If you develop chest pain or shortness of breath seek  medical attention.  You need to lose weight.  Consider a lower calorie diet and regular exercise.  Avoids foods high in acid such as tomatoes citrus juices, and spicy foods.  Avoid eating within two hours of lying down or before exercising.  Do not overheat.  Try smaller more frequent meals.  If symptoms persist, elevate the head of her bed 12 inches while sleeping.  Return in 6 months for follow-up  

## 2013-01-19 NOTE — Progress Notes (Signed)
Subjective:    Patient ID: [name], female    DOB: 12-21-1929, 79 y.o.   MRN: [id number]  HPI  79 year old patient who is seen today for a wellness visit. Complaints include fatigue and arthritic pain mainly involving the left knee. She is followed by orthopedics. Her last knee surgery in 2012 of all the right knee and was consultative by acute blood loss anemia requiring several units of blood. She generally feels weak and unwell. Her arthritis limits her activity. She uses a cane  Past Medical History  Diagnosis Date  . ANEMIA 06/30/2007  . COLONIC POLYPS, HX OF 01/21/2007  . COLONIC POLYPS, HX OF 01/21/2007  . CORONARY ARTERY DISEASE 04/05/2008  . DEGENERATIVE JOINT DISEASE 05/19/2007  . DEPRESSION 02/07/2010  . DIASTOLIC HEART FAILURE, CHRONIC 02/27/2009  . DIVERTICULOSIS, COLON 01/21/2007  . DYSPNEA 11/13/2009  . Edema 11/13/2009  . GERD 01/21/2007  . HIATAL HERNIA WITH REFLUX 01/21/2007  . HYPERTENSION 01/21/2007  . LIMB PAIN 11/13/2009  . NECK PAIN, CHRONIC 02/13/2010  . Pure hypercholesterolemia 04/05/2008    History   Social History  . Marital Status: Married    Spouse Name: N/A    Number of Children: N/A  . Years of Education: N/A   Occupational History  . Not on file.   Social History Main Topics  . Smoking status: Former Smoker    Quit date: 07/16/1967  . Smokeless tobacco: Never Used  . Alcohol Use: No  . Drug Use: No  . Sexually Active: Not on file   Other Topics Concern  . Not on file   Social History Narrative  . No narrative on file    Past Surgical History  Procedure Laterality Date  . Appendectomy    . Cholecystectomy    . Abdominal hysterectomy    . Lumbar laminectomy    . Total knee arthroplasty    . Rotator cuff repair    . Cardiac stent    . Neck surgery      Family History  Problem Relation Age of Onset  . Osteoarthritis Mother   . Heart failure Mother     No Known Allergies  Current Outpatient Prescriptions on File Prior to Visit   Medication Sig Dispense Refill  . aspirin 325 MG tablet Take 325 mg by mouth daily.        . baclofen (LIORESAL) 10 MG tablet Take 1 tablet (10 mg total) by mouth 3 (three) times daily.  90 tablet  5  . benazepril (LOTENSIN) 40 MG tablet Take 1 tablet (40 mg total) by mouth daily.  90 tablet  6  . celecoxib (CELEBREX) 200 MG capsule Take 1 capsule (200 mg total) by mouth daily.  90 capsule  3  . conjugated estrogens (PREMARIN) vaginal cream Place vaginally as needed.  42.5 g  2  . furosemide (LASIX) 20 MG tablet Take 1 tablet (20 mg total) by mouth daily as needed.  90 tablet  6  . gabapentin (NEURONTIN) 600 MG tablet Take 1 tablet (600 mg total) by mouth 3 (three) times daily.  270 tablet  6  . metoprolol (LOPRESSOR) 50 MG tablet Take 1 tablet (50 mg total) by mouth 2 (two) times daily.  180 tablet  3  . nitroGLYCERIN (NITROSTAT) 0.4 MG SL tablet Place 0.4 mg under the tongue every 5 (five) minutes as needed.        Marland Kitchen omeprazole (PRILOSEC) 40 MG capsule Take 1 capsule (40 mg total) by mouth daily.  90  capsule  3  . sertraline (ZOLOFT) 50 MG tablet Take 1 tablet (50 mg total) by mouth daily.  90 tablet  6  . simvastatin (ZOCOR) 40 MG tablet Take 1 tablet (40 mg total) by mouth at bedtime.  90 tablet  6  . spironolactone (ALDACTONE) 25 MG tablet Take 1 tablet (25 mg total) by mouth daily.  90 tablet  6  . traZODone (DESYREL) 100 MG tablet Take 0.5 tablets (50 mg total) by mouth at bedtime. 1/2 at bedtime  90 tablet  6  . triamcinolone cream (KENALOG) 0.1 % APPLY TOPICALLY TWICE DAILY  30 g  3   No current facility-administered medications on file prior to visit.    BP 152/80  Pulse 58  Temp(Src) 97.6 F (36.4 C) (Oral)  Resp 20  Ht 5' 3.5" (1.613 m)  Wt 188 lb (85.276 kg)  BMI 32.78 kg/m2  SpO2 98%     Family History:  father diedt age 73 and her heart condition  mother died age 75, heart failure, and significant osteoarthritis  Four brothers three half brothers -positive for    renal failure  three half sisters   1. Risk factors, based on past  M,S,F history  cardiovascular as factors include a history of dyslipidemia. She has documented coronary artery disease with chronic diastolic heart failure. This has been stable  2.  Physical activities:  Limited due to arthritis and general deconditioning  3.  Depression/mood: History depression which has been managed with sertraline.  4.  Hearing: No major deficits  5.  ADL's: Fairly independent in all aspects of daily living. Does live alone and cares for a dog  6.  Fall risk: Moderately high do to maintain obesity and deconditioning  7.  Home safety: No problems identified  8.  Height weight, and visual acuity; height and weight stable no change in visual acuity  9.  Counseling: Weight loss encouraged  10. Lab orders based on risk factors: Laboratory profile will be reviewed  11. Referral : Followup orthopedics  12. Care plan: Heart healthy diet weight loss encouraged graded exercise program recommended  13. Cognitive assessment: Alert in order with normal affect. No cognitive dysfunction     Review of Systems  Constitutional: Negative for fever, appetite change, fatigue and unexpected weight change.  HENT: Negative for hearing loss, ear pain, nosebleeds, congestion, sore throat, mouth sores, trouble swallowing, neck stiffness, dental problem, voice change, sinus pressure and tinnitus.   Eyes: Negative for photophobia, pain, redness and visual disturbance.  Respiratory: Positive for cough. Negative for chest tightness and shortness of breath.   Cardiovascular: Negative for chest pain, palpitations and leg swelling.  Gastrointestinal: Positive for nausea. Negative for vomiting, abdominal pain, diarrhea, constipation, blood in stool, abdominal distention and rectal pain.  Genitourinary: Negative for dysuria, urgency, frequency, hematuria, flank pain, vaginal bleeding, vaginal discharge, difficulty urinating,  genital sores, vaginal pain, menstrual problem and pelvic pain.  Musculoskeletal: Positive for back pain and gait problem. Negative for arthralgias (left knee pain).  Skin: Negative for rash.  Neurological: Positive for weakness. Negative for dizziness, syncope, speech difficulty, light-headedness, numbness and headaches.  Hematological: Negative for adenopathy. Does not bruise/bleed easily.  Psychiatric/Behavioral: Negative for suicidal ideas, behavioral problems, self-injury, dysphoric mood and agitation. The patient is not nervous/anxious.        Objective:   Physical Exam  Constitutional: She is oriented to person, place, and time. She appears well-developed and well-nourished.  Blood pressure well controlled Weight 188  HENT:  Head: Normocephalic and atraumatic.  Right Ear: External ear normal.  Left Ear: External ear normal.  Mouth/Throat: Oropharynx is clear and moist.  Eyes: Conjunctivae and EOM are normal.  Neck: Normal range of motion. Neck supple. No JVD present. No thyromegaly present.  Cardiovascular: Normal rate, regular rhythm, normal heart sounds and intact distal pulses.   No murmur heard. Dorsalis pedis pulse is intact. Posterior tibial pulses not easily palpable  Pulmonary/Chest: Effort normal and breath sounds normal. She has no wheezes. She has no rales.  Abdominal: Soft. Bowel sounds are normal. She exhibits no distension and no mass. There is no tenderness. There is no rebound and no guarding.  Right upper quadrant scar  Musculoskeletal: Normal range of motion. She exhibits no edema and no tenderness.  Status post bilateral total knee replacements  Neurological: She is alert and oriented to person, place, and time. She has normal reflexes. No cranial nerve deficit. She exhibits normal muscle tone. Coordination normal.  Decreased vibratory sensation distally  Skin: Skin is warm and dry. No rash noted.  Psychiatric: She has a normal mood and affect. Her behavior  is normal.          Assessment & Plan:   Preventive health examination Osteoarthritis Deconditioning History of coronary artery disease stable Hypertension stable Gastroesophageal reflux disease  Will check updated lab Weight loss encouraged Followup orthopedic physicians

## 2013-01-27 ENCOUNTER — Ambulatory Visit (INDEPENDENT_AMBULATORY_CARE_PROVIDER_SITE_OTHER): Payer: Medicare HMO | Admitting: Internal Medicine

## 2013-01-27 ENCOUNTER — Encounter: Payer: Self-pay | Admitting: Internal Medicine

## 2013-01-27 VITALS — BP 156/80 | HR 76 | Temp 97.9°F | Resp 20 | Wt 184.0 lb

## 2013-01-27 DIAGNOSIS — D649 Anemia, unspecified: Secondary | ICD-10-CM

## 2013-01-27 DIAGNOSIS — I10 Essential (primary) hypertension: Secondary | ICD-10-CM

## 2013-01-27 DIAGNOSIS — M1990 Unspecified osteoarthritis, unspecified site: Secondary | ICD-10-CM

## 2013-01-27 LAB — CBC WITH DIFFERENTIAL/PLATELET
Basophils Absolute: 0 10*3/uL (ref 0.0–0.1)
Basophils Relative: 0.3 % (ref 0.0–3.0)
Eosinophils Absolute: 0.2 10*3/uL (ref 0.0–0.7)
Eosinophils Relative: 1.6 % (ref 0.0–5.0)
HCT: 31.2 % — ABNORMAL LOW (ref 36.0–46.0)
Hemoglobin: 10 g/dL — ABNORMAL LOW (ref 12.0–15.0)
Lymphocytes Relative: 17.5 % (ref 12.0–46.0)
Lymphs Abs: 1.7 10*3/uL (ref 0.7–4.0)
MCHC: 31.9 g/dL (ref 30.0–36.0)
MCV: 76.6 fl — ABNORMAL LOW (ref 78.0–100.0)
Monocytes Absolute: 0.8 10*3/uL (ref 0.1–1.0)
Monocytes Relative: 8.1 % (ref 3.0–12.0)
Neutro Abs: 6.8 10*3/uL (ref 1.4–7.7)
Neutrophils Relative %: 72.5 % (ref 43.0–77.0)
Platelets: 197 10*3/uL (ref 150.0–400.0)
RBC: 4.08 Mil/uL (ref 3.87–5.11)
RDW: 18.5 % — ABNORMAL HIGH (ref 11.5–14.6)
WBC: 9.4 10*3/uL (ref 4.5–10.5)

## 2013-01-27 LAB — BASIC METABOLIC PANEL
BUN: 26 mg/dL — ABNORMAL HIGH (ref 6–23)
CO2: 27 mEq/L (ref 19–32)
Calcium: 9.7 mg/dL (ref 8.4–10.5)
Chloride: 105 mEq/L (ref 96–112)
Creatinine, Ser: 1.3 mg/dL — ABNORMAL HIGH (ref 0.4–1.2)
GFR: 41.8 mL/min — ABNORMAL LOW (ref >60.00)
Glucose, Bld: 103 mg/dL — ABNORMAL HIGH (ref 70–99)
Potassium: 5.2 mEq/L — ABNORMAL HIGH (ref 3.5–5.1)
Sodium: 140 mEq/L (ref 135–145)

## 2013-01-27 LAB — FERRITIN: Ferritin: 14 ng/mL (ref 10.0–291.0)

## 2013-01-27 NOTE — Patient Instructions (Addendum)
Decrease iron to once daily  Return stool specimens to check for occult blood  Acute bronchitis symptoms for less than 10 days are generally not helped by antibiotics.  Take over-the-counter expectorants and cough medications such as  Mucinex DM.  Call if there is no improvement in 5 to 7 days or if he developed worsening cough, fever, or new symptoms, such as shortness of breath or chest pain.

## 2013-01-27 NOTE — Progress Notes (Signed)
Subjective:    Patient ID: [name], female    DOB: September 29, 1929, 79 y.o.   MRN: [id number]  HPI  79 year old patient who is seen today in followup.  She was seen one week ago for her annual health assessment noted to have moderately severe anemia with a hemoglobin 8.8 hematocrit of 27.6. Daily aspirin and Celebrex have been discontinued. She remains on PPI therapy and iron has been added to her regimen. She feels unwell today with anorexia that she attributes to the iron. She continues to have fatigue and over the past 2 days has had chest congestion and cough. She also states for the past year she has had nocturia but no frequency or urgency throughout the day. She does have significant arthritis that has worsened since Celebrex has been discontinued. She does have coronary artery disease and treated hypertension.  Past Medical History  Diagnosis Date  . ANEMIA 06/30/2007  . COLONIC POLYPS, HX OF 01/21/2007  . COLONIC POLYPS, HX OF 01/21/2007  . CORONARY ARTERY DISEASE 04/05/2008  . DEGENERATIVE JOINT DISEASE 05/19/2007  . DEPRESSION 02/07/2010  . DIASTOLIC HEART FAILURE, CHRONIC 02/27/2009  . DIVERTICULOSIS, COLON 01/21/2007  . DYSPNEA 11/13/2009  . Edema 11/13/2009  . GERD 01/21/2007  . HIATAL HERNIA WITH REFLUX 01/21/2007  . HYPERTENSION 01/21/2007  . LIMB PAIN 11/13/2009  . NECK PAIN, CHRONIC 02/13/2010  . Pure hypercholesterolemia 04/05/2008    History   Social History  . Marital Status: Married    Spouse Name: N/A    Number of Children: N/A  . Years of Education: N/A   Occupational History  . Not on file.   Social History Main Topics  . Smoking status: Former Smoker    Quit date: 07/16/1967  . Smokeless tobacco: Never Used  . Alcohol Use: No  . Drug Use: No  . Sexually Active: Not on file   Other Topics Concern  . Not on file   Social History Narrative  . No narrative on file    Past Surgical History  Procedure Laterality Date  . Appendectomy    . Cholecystectomy    .  Abdominal hysterectomy    . Lumbar laminectomy    . Total knee arthroplasty    . Rotator cuff repair    . Cardiac stent    . Neck surgery      Family History  Problem Relation Age of Onset  . Osteoarthritis Mother   . Heart failure Mother     No Known Allergies  Current Outpatient Prescriptions on File Prior to Visit  Medication Sig Dispense Refill  . baclofen (LIORESAL) 10 MG tablet Take 1 tablet (10 mg total) by mouth 3 (three) times daily.  90 tablet  5  . benazepril (LOTENSIN) 40 MG tablet Take 1 tablet (40 mg total) by mouth daily.  90 tablet  6  . conjugated estrogens (PREMARIN) vaginal cream Place vaginally as needed.  42.5 g  2  . furosemide (LASIX) 20 MG tablet Take 1 tablet (20 mg total) by mouth daily as needed.  90 tablet  6  . gabapentin (NEURONTIN) 600 MG tablet Take 1 tablet (600 mg total) by mouth 3 (three) times daily.  270 tablet  6  . HYDROcodone-acetaminophen (NORCO/VICODIN) 5-325 MG per tablet TAKE ONE TABLET BY MOUTH EVERY 6 HOURS AS NEEDED FOR PAIN  90 tablet  2  . LORazepam (ATIVAN) 0.5 MG tablet Take 1 tablet (0.5 mg total) by mouth every 8 (eight) hours as needed for anxiety.  180 tablet  1  . metoprolol (LOPRESSOR) 50 MG tablet Take 1 tablet (50 mg total) by mouth 2 (two) times daily.  180 tablet  3  . nitroGLYCERIN (NITROSTAT) 0.4 MG SL tablet Place 0.4 mg under the tongue every 5 (five) minutes as needed.        Marland Kitchen omeprazole (PRILOSEC) 40 MG capsule Take 1 capsule (40 mg total) by mouth daily.  90 capsule  3  . sertraline (ZOLOFT) 50 MG tablet Take 1 tablet (50 mg total) by mouth daily.  90 tablet  6  . simvastatin (ZOCOR) 40 MG tablet Take 1 tablet (40 mg total) by mouth at bedtime.  90 tablet  6  . spironolactone (ALDACTONE) 25 MG tablet Take 1 tablet (25 mg total) by mouth daily.  90 tablet  6  . traZODone (DESYREL) 100 MG tablet Take 0.5 tablets (50 mg total) by mouth at bedtime. 1/2 at bedtime  90 tablet  6  . triamcinolone cream (KENALOG) 0.1 % APPLY  TOPICALLY TWICE DAILY  30 g  3   No current facility-administered medications on file prior to visit.    BP 156/80  Pulse 76  Temp(Src) 97.9 F (36.6 C) (Oral)  Resp 20  Wt 184 lb (83.462 kg)  BMI 32.08 kg/m2  SpO2 97%       Review of Systems  Constitutional: Positive for activity change, appetite change and fatigue.  HENT: Negative for hearing loss, congestion, sore throat, rhinorrhea, dental problem, sinus pressure and tinnitus.   Eyes: Negative for pain, discharge and visual disturbance.  Respiratory: Positive for cough. Negative for shortness of breath.   Cardiovascular: Negative for chest pain, palpitations and leg swelling.  Gastrointestinal: Negative for nausea, vomiting, abdominal pain, diarrhea, constipation, blood in stool and abdominal distention.  Genitourinary: Negative for dysuria, urgency, frequency, hematuria, flank pain, vaginal bleeding, vaginal discharge, difficulty urinating, vaginal pain and pelvic pain.  Musculoskeletal: Negative for joint swelling, arthralgias and gait problem.  Skin: Negative for rash.  Neurological: Positive for weakness. Negative for dizziness, syncope, speech difficulty, numbness and headaches.  Hematological: Negative for adenopathy.  Psychiatric/Behavioral: Negative for behavioral problems, dysphoric mood and agitation. The patient is not nervous/anxious.        Objective:   Physical Exam  Constitutional: She is oriented to person, place, and time. She appears well-developed and well-nourished.  Blood pressure 150/80. No tachycardia. Afebrile. O2 saturation 97%.  HENT:  Head: Normocephalic.  Right Ear: External ear normal.  Left Ear: External ear normal.  Mouth/Throat: Oropharynx is clear and moist.  Eyes: Conjunctivae and EOM are normal. Pupils are equal, round, and reactive to light.  Neck: Normal range of motion. Neck supple. No thyromegaly present.  Cardiovascular: Normal rate, regular rhythm, normal heart sounds and  intact distal pulses.   Pulmonary/Chest: Effort normal and breath sounds normal.  Abdominal: Soft. Bowel sounds are normal. She exhibits no distension and no mass. There is no tenderness. There is no rebound and no guarding.  Musculoskeletal: Normal range of motion.  Lymphadenopathy:    She has no cervical adenopathy.  Neurological: She is alert and oriented to person, place, and time.  Skin: Skin is warm and dry. No rash noted.  Psychiatric: She has a normal mood and affect. Her behavior is normal.          Assessment & Plan:   Microcytic anemia. Probable upper GI bleeding related to anti-inflammatory medications. Will continue PPI therapy and daily iron we'll check a CBC with ferritin level today  Increased BUN probably secondary to GI bleeding. We'll repeat today Fatigue multifactorial Arthritis  Will check stool for occult blood x4

## 2013-02-01 ENCOUNTER — Telehealth: Payer: Self-pay | Admitting: Internal Medicine

## 2013-02-01 MED ORDER — BENZONATATE 100 MG PO CAPS: 100.0000 mg | ORAL_CAPSULE | Freq: Three times a day (TID) | ORAL | Status: DC | PRN

## 2013-02-01 NOTE — Telephone Encounter (Signed)
Patient Information:  Caller Name: Olegario Messier  Phone: [phone]  Patient: [name], [name]  Gender: Female  DOB: 06-Feb-1930  Age: 79 Years  PCP: Eleonore Chiquito [facility])  Office Follow Up:  Does the office need to follow up with this patient?: Yes  Instructions For The Office: OFFICE, please follow up with patient if medication can be called in without office visit.  Pt was recently seen on 01/27/13   Symptoms  Reason For Call & Symptoms: cough and congestion.  Pt was seen in the office on 01/27/13.  Pt is coughing up mucous that is yellow, green  Reviewed Health History In EMR: Yes  Reviewed Medications In EMR: Yes  Reviewed Allergies In EMR: Yes  Reviewed Surgeries / Procedures: Yes  Date of Onset of Symptoms: 01/22/2013  Treatments Tried: mucinex, increase in fluids  Treatments Tried Worked: No  Any Fever: Yes  Fever Taken: Oral  Fever Time Of Reading: 20:00:00  Fever Last Reading: 100  Guideline(s) Used:  Sinus Pain and Congestion  Disposition Per Guideline:   See Today or Tomorrow in Office  Reason For Disposition Reached:   Sinus congestion (pressure, fullness) present > 10 days  Advice Given:  N/A  RN Overrode Recommendation:  Patient Requests Prescription  Pt is requesting a prescription.  Pt states she has been to the office for the past two weeks and is coming back next week.  Pt is wanting medicaiton to be called into [facility] at [employer]

## 2013-02-01 NOTE — Telephone Encounter (Signed)
Have patient switch to Mucinex DM twice daily, force fluids and also call in a prescription for generic Tessalon 100 mg #30 one 3 times a day

## 2013-02-01 NOTE — Telephone Encounter (Signed)
Spoke to pt told her Rx sent to pharmacy for Tessalon capsules 100 mg TID. Also need to switch to Mucinex DM twice a day and force fluids. Pt verbalized understanding.

## 2013-02-03 ENCOUNTER — Other Ambulatory Visit (INDEPENDENT_AMBULATORY_CARE_PROVIDER_SITE_OTHER): Payer: Medicare HMO

## 2013-02-03 DIAGNOSIS — D649 Anemia, unspecified: Secondary | ICD-10-CM

## 2013-02-03 LAB — HEMOCCULT GUIAC POC 1CARD (OFFICE)
Card #2 Fecal Occult Blod, POC: POSITIVE
Fecal Occult Blood, POC: POSITIVE

## 2013-02-05 ENCOUNTER — Other Ambulatory Visit: Payer: Self-pay | Admitting: Internal Medicine

## 2013-02-05 DIAGNOSIS — D509 Iron deficiency anemia, unspecified: Secondary | ICD-10-CM

## 2013-02-09 ENCOUNTER — Encounter: Payer: Self-pay | Admitting: Internal Medicine

## 2013-02-10 ENCOUNTER — Encounter: Payer: Self-pay | Admitting: Internal Medicine

## 2013-02-10 ENCOUNTER — Ambulatory Visit (INDEPENDENT_AMBULATORY_CARE_PROVIDER_SITE_OTHER): Payer: Medicare HMO | Admitting: Internal Medicine

## 2013-02-10 VITALS — BP 120/64 | HR 71 | Temp 97.6°F | Resp 20 | Wt 181.0 lb

## 2013-02-10 DIAGNOSIS — Z23 Encounter for immunization: Secondary | ICD-10-CM

## 2013-02-10 DIAGNOSIS — M1990 Unspecified osteoarthritis, unspecified site: Secondary | ICD-10-CM

## 2013-02-10 DIAGNOSIS — Z86010 Personal history of colonic polyps: Secondary | ICD-10-CM

## 2013-02-10 DIAGNOSIS — D649 Anemia, unspecified: Secondary | ICD-10-CM

## 2013-02-10 LAB — CBC WITH DIFFERENTIAL/PLATELET
Basophils Absolute: 0 10*3/uL (ref 0.0–0.1)
Basophils Relative: 0.3 % (ref 0.0–3.0)
Eosinophils Absolute: 0.1 10*3/uL (ref 0.0–0.7)
Eosinophils Relative: 0.7 % (ref 0.0–5.0)
HCT: 33.8 % — ABNORMAL LOW (ref 36.0–46.0)
Hemoglobin: 10.5 g/dL — ABNORMAL LOW (ref 12.0–15.0)
Lymphocytes Relative: 16.9 % (ref 12.0–46.0)
Lymphs Abs: 1.4 10*3/uL (ref 0.7–4.0)
MCHC: 31.1 g/dL (ref 30.0–36.0)
MCV: 78.4 fl (ref 78.0–100.0)
Monocytes Absolute: 0.4 10*3/uL (ref 0.1–1.0)
Monocytes Relative: 5.4 % (ref 3.0–12.0)
Neutro Abs: 6.2 10*3/uL (ref 1.4–7.7)
Neutrophils Relative %: 76.7 % (ref 43.0–77.0)
Platelets: 233 10*3/uL (ref 150.0–400.0)
RBC: 4.31 Mil/uL (ref 3.87–5.11)
RDW: 20.8 % — ABNORMAL HIGH (ref 11.5–14.6)
WBC: 8.1 10*3/uL (ref 4.5–10.5)

## 2013-02-10 MED ORDER — TRAMADOL HCL 50 MG PO TABS
50.0000 mg | ORAL_TABLET | Freq: Three times a day (TID) | ORAL | Status: DC | PRN
Start: 2013-02-10 — End: 2013-04-28

## 2013-02-10 MED ORDER — BENZONATATE 100 MG PO CAPS: 100.0000 mg | ORAL_CAPSULE | Freq: Three times a day (TID) | ORAL | Status: DC | PRN

## 2013-02-10 NOTE — Progress Notes (Signed)
Subjective:    Patient ID: [name], female    DOB: Aug 18, 1929, 79 y.o.   MRN: [id number]  HPI  79 year old patient who is seen today for followup of anemia and hematest positive stool.  She has returned stool for occult blood which has been positive. She is scheduled to see GI on September 3. Her last hemoglobin was improved on iron therapy.  Both Celebrex and aspirin therapy have been discontinued. She has noted worsening arthritis pain and especially headaches. She does use when necessary Vicodin. She does have a history of colonic polyps her last colonoscopy was probably 2007  Past Medical History  Diagnosis Date  . ANEMIA 06/30/2007  . COLONIC POLYPS, HX OF 01/21/2007  . COLONIC POLYPS, HX OF 01/21/2007  . CORONARY ARTERY DISEASE 04/05/2008  . DEGENERATIVE JOINT DISEASE 05/19/2007  . DEPRESSION 02/07/2010  . DIASTOLIC HEART FAILURE, CHRONIC 02/27/2009  . DIVERTICULOSIS, COLON 01/21/2007  . DYSPNEA 11/13/2009  . Edema 11/13/2009  . GERD 01/21/2007  . HIATAL HERNIA WITH REFLUX 01/21/2007  . HYPERTENSION 01/21/2007  . LIMB PAIN 11/13/2009  . NECK PAIN, CHRONIC 02/13/2010  . Pure hypercholesterolemia 04/05/2008    History   Social History  . Marital Status: Married    Spouse Name: N/A    Number of Children: N/A  . Years of Education: N/A   Occupational History  . Not on file.   Social History Main Topics  . Smoking status: Former Smoker    Quit date: 07/16/1967  . Smokeless tobacco: Never Used  . Alcohol Use: No  . Drug Use: No  . Sexually Active: Not on file   Other Topics Concern  . Not on file   Social History Narrative  . No narrative on file    Past Surgical History  Procedure Laterality Date  . Appendectomy    . Cholecystectomy    . Abdominal hysterectomy    . Lumbar laminectomy    . Total knee arthroplasty    . Rotator cuff repair    . Cardiac stent    . Neck surgery      Family History  Problem Relation Age of Onset  . Osteoarthritis Mother   . Heart failure  Mother     No Known Allergies  Current Outpatient Prescriptions on File Prior to Visit  Medication Sig Dispense Refill  . baclofen (LIORESAL) 10 MG tablet Take 1 tablet (10 mg total) by mouth 3 (three) times daily.  90 tablet  5  . benazepril (LOTENSIN) 40 MG tablet Take 1 tablet (40 mg total) by mouth daily.  90 tablet  6  . benzonatate (TESSALON) 100 MG capsule Take 1 capsule (100 mg total) by mouth 3 (three) times daily as needed for cough.  30 capsule  0  . conjugated estrogens (PREMARIN) vaginal cream Place vaginally as needed.  42.5 g  2  . ferrous sulfate 325 (65 FE) MG tablet Take 325 mg by mouth daily with breakfast.       . furosemide (LASIX) 20 MG tablet Take 1 tablet (20 mg total) by mouth daily as needed.  90 tablet  6  . gabapentin (NEURONTIN) 600 MG tablet Take 1 tablet (600 mg total) by mouth 3 (three) times daily.  270 tablet  6  . HYDROcodone-acetaminophen (NORCO/VICODIN) 5-325 MG per tablet TAKE ONE TABLET BY MOUTH EVERY 6 HOURS AS NEEDED FOR PAIN  90 tablet  2  . LORazepam (ATIVAN) 0.5 MG tablet Take 1 tablet (0.5 mg total) by mouth every  8 (eight) hours as needed for anxiety.  180 tablet  1  . metoprolol (LOPRESSOR) 50 MG tablet Take 1 tablet (50 mg total) by mouth 2 (two) times daily.  180 tablet  3  . nitroGLYCERIN (NITROSTAT) 0.4 MG SL tablet Place 0.4 mg under the tongue every 5 (five) minutes as needed.        Marland Kitchen omeprazole (PRILOSEC) 40 MG capsule Take 1 capsule (40 mg total) by mouth daily.  90 capsule  3  . sertraline (ZOLOFT) 50 MG tablet Take 1 tablet (50 mg total) by mouth daily.  90 tablet  6  . simvastatin (ZOCOR) 40 MG tablet Take 1 tablet (40 mg total) by mouth at bedtime.  90 tablet  6  . spironolactone (ALDACTONE) 25 MG tablet Take 1 tablet (25 mg total) by mouth daily.  90 tablet  6  . traZODone (DESYREL) 100 MG tablet Take 0.5 tablets (50 mg total) by mouth at bedtime. 1/2 at bedtime  90 tablet  6  . triamcinolone cream (KENALOG) 0.1 % APPLY TOPICALLY  TWICE DAILY  30 g  3   No current facility-administered medications on file prior to visit.    BP 120/64  Pulse 71  Temp(Src) 97.6 F (36.4 C) (Oral)  Resp 20  Wt 181 lb (82.101 kg)  BMI 31.56 kg/m2  SpO2 98%       Review of Systems  Constitutional: Positive for fatigue.  HENT: Negative for hearing loss, congestion, sore throat, rhinorrhea, dental problem, sinus pressure and tinnitus.   Eyes: Negative for pain, discharge and visual disturbance.  Respiratory: Negative for cough and shortness of breath.   Cardiovascular: Negative for chest pain, palpitations and leg swelling.  Gastrointestinal: Negative for nausea, vomiting, abdominal pain, diarrhea, constipation, blood in stool and abdominal distention.  Genitourinary: Negative for dysuria, urgency, frequency, hematuria, flank pain, vaginal bleeding, vaginal discharge, difficulty urinating, vaginal pain and pelvic pain.  Musculoskeletal: Positive for arthralgias. Negative for joint swelling and gait problem.  Skin: Negative for rash.  Neurological: Positive for headaches. Negative for dizziness, syncope, speech difficulty, weakness and numbness.  Hematological: Negative for adenopathy.  Psychiatric/Behavioral: Negative for behavioral problems, dysphoric mood and agitation. The patient is not nervous/anxious.        Objective:   Physical Exam  Constitutional: She is oriented to person, place, and time. She appears well-developed and well-nourished.  HENT:  Head: Normocephalic.  Right Ear: External ear normal.  Left Ear: External ear normal.  Mouth/Throat: Oropharynx is clear and moist.  Eyes: Conjunctivae and EOM are normal. Pupils are equal, round, and reactive to light.  Neck: Normal range of motion. Neck supple. No thyromegaly present.  Cardiovascular: Normal rate, regular rhythm, normal heart sounds and intact distal pulses.   Pulmonary/Chest: Effort normal and breath sounds normal.  Abdominal: Soft. Bowel sounds are  normal. She exhibits no mass. There is no tenderness.  Musculoskeletal: Normal range of motion.  Lymphadenopathy:    She has no cervical adenopathy.  Neurological: She is alert and oriented to person, place, and time.  Skin: Skin is warm and dry. No rash noted.  Psychiatric: She has a normal mood and affect. Her behavior is normal.          Assessment & Plan:   Anemia secondary to chronic blood loss GI bleeding History colonic polyps  Continue iron supplementation and PPI therapy GI consultation as scheduled CBC  Recheck 1 month

## 2013-02-10 NOTE — Patient Instructions (Signed)
Continue Nexium Continue iron supplementation  GI followup as scheduled

## 2013-02-24 ENCOUNTER — Telehealth: Payer: Self-pay | Admitting: Internal Medicine

## 2013-02-24 NOTE — Telephone Encounter (Signed)
Pt would like results of labs done 7/30.  pls call.

## 2013-02-25 NOTE — Telephone Encounter (Signed)
Hemoglobin to 10.5 which is improved

## 2013-02-25 NOTE — Telephone Encounter (Signed)
Left message on voicemail to call office.  

## 2013-02-26 NOTE — Telephone Encounter (Signed)
Spoke to pt's daughter Olegario Messier told her Hemoglobin has improved to 10.5 per Dr. Amador Cunas. Olegario Messier verbalized understanding. Olegario Messier thinks pt maybe having side effects from Zoloft but can't discuss in front of pt all the time because she gets upset has follow up appt on 8/27. Told her if she likes can make appt next week to discuss with Dr. Amador Cunas. Olegario Messier said she will see if her mom will come in early has appt with Dr. August Saucer next week and might discuss with him. Told her okay.

## 2013-03-08 ENCOUNTER — Other Ambulatory Visit: Payer: Self-pay | Admitting: Orthopedic Surgery

## 2013-03-08 DIAGNOSIS — M25551 Pain in right hip: Secondary | ICD-10-CM

## 2013-03-10 ENCOUNTER — Telehealth: Payer: Self-pay | Admitting: Internal Medicine

## 2013-03-10 ENCOUNTER — Ambulatory Visit: Payer: Medicare HMO | Admitting: Internal Medicine

## 2013-03-10 ENCOUNTER — Ambulatory Visit
Admission: RE | Admit: 2013-03-10 | Discharge: 2013-03-10 | Disposition: A | Discharge status: Home / Self Care | Payer: Medicare HMO | Source: Ambulatory Visit | Attending: Orthopedic Surgery | Admitting: Orthopedic Surgery

## 2013-03-10 ENCOUNTER — Ambulatory Visit (INDEPENDENT_AMBULATORY_CARE_PROVIDER_SITE_OTHER): Payer: Medicare HMO | Admitting: Internal Medicine

## 2013-03-10 ENCOUNTER — Other Ambulatory Visit: Payer: Medicare HMO

## 2013-03-10 ENCOUNTER — Encounter: Payer: Self-pay | Admitting: Internal Medicine

## 2013-03-10 VITALS — BP 140/80 | HR 61 | Temp 97.8°F | Resp 20 | Wt 187.0 lb

## 2013-03-10 DIAGNOSIS — M1990 Unspecified osteoarthritis, unspecified site: Secondary | ICD-10-CM

## 2013-03-10 DIAGNOSIS — D649 Anemia, unspecified: Secondary | ICD-10-CM

## 2013-03-10 DIAGNOSIS — M25551 Pain in right hip: Secondary | ICD-10-CM

## 2013-03-10 DIAGNOSIS — Z23 Encounter for immunization: Secondary | ICD-10-CM

## 2013-03-10 DIAGNOSIS — I10 Essential (primary) hypertension: Secondary | ICD-10-CM

## 2013-03-10 LAB — CBC WITH DIFFERENTIAL/PLATELET
Basophils Absolute: 0 10*3/uL (ref 0.0–0.1)
Basophils Relative: 0.4 % (ref 0.0–3.0)
Eosinophils Absolute: 0.1 10*3/uL (ref 0.0–0.7)
Eosinophils Relative: 1.5 % (ref 0.0–5.0)
HCT: 31.3 % — ABNORMAL LOW (ref 36.0–46.0)
Hemoglobin: 10.1 g/dL — ABNORMAL LOW (ref 12.0–15.0)
Lymphocytes Relative: 26.7 % (ref 12.0–46.0)
Lymphs Abs: 1.7 10*3/uL (ref 0.7–4.0)
MCHC: 32.3 g/dL (ref 30.0–36.0)
MCV: 80.1 fl (ref 78.0–100.0)
Monocytes Absolute: 0.5 10*3/uL (ref 0.1–1.0)
Monocytes Relative: 8.3 % (ref 3.0–12.0)
Neutro Abs: 4 10*3/uL (ref 1.4–7.7)
Neutrophils Relative %: 63.1 % (ref 43.0–77.0)
Platelets: 173 10*3/uL (ref 150.0–400.0)
RBC: 3.91 Mil/uL (ref 3.87–5.11)
RDW: 22.1 % — ABNORMAL HIGH (ref 11.5–14.6)
WBC: 6.4 10*3/uL (ref 4.5–10.5)

## 2013-03-10 NOTE — Telephone Encounter (Signed)
Pt would like to know if she can go up to HYDROcodone-acetaminophen (NORCO/VICODIN) 10-325 MG per  From the HYDROcodone-acetaminophen (NORCO/VICODIN) 5-325 MG per ? Pt seen today and forgot to ask MD [facility] [place]

## 2013-03-10 NOTE — Telephone Encounter (Signed)
Pt aware Dr Kirtland Bouchard asked her to continue tylenol as directed.  If this does not work, pls call back. Pt verbalized understanding

## 2013-03-10 NOTE — Progress Notes (Signed)
Subjective:    Patient ID: [name], female    DOB: Aug 21, 1929, 79 y.o.   MRN: [id number]  HPI   79 year old patient who is seen today for followup. She has a history of GI bleeding and anemia which has steadily improved since discontinuation of anti-inflammatory medications. She is scheduled to see GI next week in followup. She has significant arthritis and has been followed by orthopedics. She did have a MRI performed earlier today to evaluate hip pain. She generally feels well and her weakness and fatigue have greatly improved. Remains on iron supplementation PPI therapy and off anti-inflammatory medications  Past Medical History  Diagnosis Date  . ANEMIA 06/30/2007  . COLONIC POLYPS, HX OF 01/21/2007  . COLONIC POLYPS, HX OF 01/21/2007  . CORONARY ARTERY DISEASE 04/05/2008  . DEGENERATIVE JOINT DISEASE 05/19/2007  . DEPRESSION 02/07/2010  . DIASTOLIC HEART FAILURE, CHRONIC 02/27/2009  . DIVERTICULOSIS, COLON 01/21/2007  . DYSPNEA 11/13/2009  . Edema 11/13/2009  . GERD 01/21/2007  . HIATAL HERNIA WITH REFLUX 01/21/2007  . HYPERTENSION 01/21/2007  . LIMB PAIN 11/13/2009  . NECK PAIN, CHRONIC 02/13/2010  . Pure hypercholesterolemia 04/05/2008    History   Social History  . Marital Status: Married    Spouse Name: N/A    Number of Children: N/A  . Years of Education: N/A   Occupational History  . Not on file.   Social History Main Topics  . Smoking status: Former Smoker    Quit date: 07/16/1967  . Smokeless tobacco: Never Used  . Alcohol Use: No  . Drug Use: No  . Sexual Activity: Not on file   Other Topics Concern  . Not on file   Social History Narrative  . No narrative on file    Past Surgical History  Procedure Laterality Date  . Appendectomy    . Cholecystectomy    . Abdominal hysterectomy    . Lumbar laminectomy    . Total knee arthroplasty    . Rotator cuff repair    . Cardiac stent    . Neck surgery      Family History  Problem Relation Age of Onset  .  Osteoarthritis Mother   . Heart failure Mother     No Known Allergies  Current Outpatient Prescriptions on File Prior to Visit  Medication Sig Dispense Refill  . baclofen (LIORESAL) 10 MG tablet Take 1 tablet (10 mg total) by mouth 3 (three) times daily.  90 tablet  5  . benazepril (LOTENSIN) 40 MG tablet Take 1 tablet (40 mg total) by mouth daily.  90 tablet  6  . conjugated estrogens (PREMARIN) vaginal cream Place vaginally as needed.  42.5 g  2  . ferrous sulfate 325 (65 FE) MG tablet Take 325 mg by mouth daily with breakfast.       . furosemide (LASIX) 20 MG tablet Take 1 tablet (20 mg total) by mouth daily as needed.  90 tablet  6  . gabapentin (NEURONTIN) 600 MG tablet Take 1 tablet (600 mg total) by mouth 3 (three) times daily.  270 tablet  6  . HYDROcodone-acetaminophen (NORCO/VICODIN) 5-325 MG per tablet TAKE ONE TABLET BY MOUTH EVERY 6 HOURS AS NEEDED FOR PAIN  90 tablet  2  . LORazepam (ATIVAN) 0.5 MG tablet Take 1 tablet (0.5 mg total) by mouth every 8 (eight) hours as needed for anxiety.  180 tablet  1  . metoprolol (LOPRESSOR) 50 MG tablet Take 1 tablet (50 mg total) by mouth 2 (  two) times daily.  180 tablet  3  . nitroGLYCERIN (NITROSTAT) 0.4 MG SL tablet Place 0.4 mg under the tongue every 5 (five) minutes as needed.        Marland Kitchen omeprazole (PRILOSEC) 40 MG capsule Take 1 capsule (40 mg total) by mouth daily.  90 capsule  3  . sertraline (ZOLOFT) 50 MG tablet Take 1 tablet (50 mg total) by mouth daily.  90 tablet  6  . simvastatin (ZOCOR) 40 MG tablet Take 1 tablet (40 mg total) by mouth at bedtime.  90 tablet  6  . spironolactone (ALDACTONE) 25 MG tablet Take 1 tablet (25 mg total) by mouth daily.  90 tablet  6  . traMADol (ULTRAM) 50 MG tablet Take 1 tablet (50 mg total) by mouth every 8 (eight) hours as needed for pain.  60 tablet  3  . traZODone (DESYREL) 100 MG tablet Take 0.5 tablets (50 mg total) by mouth at bedtime. 1/2 at bedtime  90 tablet  6  . triamcinolone cream  (KENALOG) 0.1 % APPLY TOPICALLY TWICE DAILY  30 g  3  . benzonatate (TESSALON) 100 MG capsule Take 1 capsule (100 mg total) by mouth 3 (three) times daily as needed for cough.  30 capsule  0   No current facility-administered medications on file prior to visit.    BP 140/80  Pulse 61  Temp(Src) 97.8 F (36.6 C) (Oral)  Resp 20  Wt 187 lb (84.823 kg)  BMI 32.6 kg/m2  SpO2 98%       Review of Systems  Constitutional: Negative.   HENT: Negative for hearing loss, congestion, sore throat, rhinorrhea, dental problem, sinus pressure and tinnitus.   Eyes: Negative for pain, discharge and visual disturbance.  Respiratory: Negative for cough and shortness of breath.   Cardiovascular: Negative for chest pain, palpitations and leg swelling.  Gastrointestinal: Negative for nausea, vomiting, abdominal pain, diarrhea, constipation, blood in stool and abdominal distention.  Genitourinary: Negative for dysuria, urgency, frequency, hematuria, flank pain, vaginal bleeding, vaginal discharge, difficulty urinating, vaginal pain and pelvic pain.  Musculoskeletal: Positive for back pain, arthralgias and gait problem. Negative for joint swelling.  Skin: Negative for rash.  Neurological: Negative for dizziness, syncope, speech difficulty, weakness, numbness and headaches.  Hematological: Negative for adenopathy.  Psychiatric/Behavioral: Negative for behavioral problems, dysphoric mood and agitation. The patient is not nervous/anxious.        Objective:   Physical Exam  Constitutional: She appears well-developed and well-nourished. No distress.  Blood pressure normal. No tachycardia  Abdominal: Soft. Bowel sounds are normal. She exhibits no distension. There is no tenderness.          Assessment & Plan:  Hypertension stable Osteoarthritis History of anemia and GI bleeding. We'll check a CBC today. GI followup next week. Recheck 4 months or as needed

## 2013-03-10 NOTE — Patient Instructions (Signed)
Limit your sodium (Salt) intake  GI followup as scheduled  Return in 4 months for follow-up

## 2013-03-17 ENCOUNTER — Encounter: Payer: Self-pay | Admitting: Internal Medicine

## 2013-03-17 ENCOUNTER — Ambulatory Visit (INDEPENDENT_AMBULATORY_CARE_PROVIDER_SITE_OTHER): Payer: Medicare HMO | Admitting: Internal Medicine

## 2013-03-17 VITALS — BP 134/84 | HR 64 | Ht 63.5 in | Wt 183.0 lb

## 2013-03-17 DIAGNOSIS — Z860100 Personal history of colon polyps, unspecified: Secondary | ICD-10-CM

## 2013-03-17 DIAGNOSIS — K219 Gastro-esophageal reflux disease without esophagitis: Secondary | ICD-10-CM

## 2013-03-17 DIAGNOSIS — D509 Iron deficiency anemia, unspecified: Secondary | ICD-10-CM

## 2013-03-17 DIAGNOSIS — K5730 Diverticulosis of large intestine without perforation or abscess without bleeding: Secondary | ICD-10-CM

## 2013-03-17 DIAGNOSIS — R195 Other fecal abnormalities: Secondary | ICD-10-CM

## 2013-03-17 NOTE — Progress Notes (Signed)
HISTORY OF PRESENT ILLNESS:  [name] is a 79 y.o. female with the below listed medical history who has been followed in this office for GERD, and colon polyp surveillance. She is referred today by her primary provider regarding iron deficiency anemia and Hemoccult-positive stool. The patient had been having nonspecific fatigue and sweating with exertion leading into her routine annual physical examination mid July 2014. Blood work revealed microcytic anemia with a hemoglobin of 8.8 and MCV 76.3. Hemoccult studies were performed and returned positive x2. She had been on aspirin Celebrex for arthritis. This was stopped. She has been on daily omeprazole for many years. She was placed on iron therapy once daily. Followup CBC 03/10/2013 was improved with a hemoglobin of 10.1 and MCV 80.1. Review of the old chart shows that she has been chronically anemic for at least the past 5 years with hemoglobin is generally less than 10. I do see a normal hemoglobin in 1999 of 13.2 with MCV 88. She has had prior colonoscopies in 1999, 2004, and 2007. Diminutive adenoma on her second exam. No polyps on her last exam. She does have marked left-sided diverticulosis with stenosis. Her last upper endoscopy was performed in December 2007. Large hernia and deformed pylorus. Prior history of ulcer disease. The patient's GI review of systems is remarkable for nocturnal regurgitation. Otherwise negative. No melena, hematochezia, change in bowel habits, or unexplained weight loss. No abdominal pain. Overall health has been good. However, she does have difficulty with ambulation due to a bad left knee. She also reports of difficulties postoperatively 2 years ago necessitating ICU admission. Parenthetically, she is feeling better after being on therapy.  REVIEW OF SYSTEMS:  All non-GI ROS negative except for arthritis, back pain, fatigue, cough, muscle cramps  Past Medical History  Diagnosis Date  . ANEMIA 06/30/2007  . COLONIC  POLYPS, HX OF 01/21/2007  . COLONIC POLYPS, HX OF 01/21/2007  . CORONARY ARTERY DISEASE 04/05/2008  . DEGENERATIVE JOINT DISEASE 05/19/2007  . DEPRESSION 02/07/2010  . DIASTOLIC HEART FAILURE, CHRONIC 02/27/2009  . DIVERTICULOSIS, COLON 01/21/2007  . DYSPNEA 11/13/2009  . Edema 11/13/2009  . GERD 01/21/2007  . HIATAL HERNIA WITH REFLUX 01/21/2007  . HYPERTENSION 01/21/2007  . LIMB PAIN 11/13/2009  . NECK PAIN, CHRONIC 02/13/2010  . Pure hypercholesterolemia 04/05/2008    Past Surgical History  Procedure Laterality Date  . Appendectomy    . Cholecystectomy    . Abdominal hysterectomy    . Lumbar laminectomy    . Total knee arthroplasty    . Rotator cuff repair    . Cardiac stent    . Neck surgery      Social History [name]  reports that she quit smoking about 45 years ago. She has never used smokeless tobacco. She reports that she does not drink alcohol or use illicit drugs.  family history includes Heart failure in her mother; Osteoarthritis in her mother.  No Known Allergies     PHYSICAL EXAMINATION: Vital signs: BP 134/84  Pulse 64  Ht 5' 3.5" (1.613 m)  Wt 183 lb (83.008 kg)  BMI 31.9 kg/m2  Constitutional: Elderly, slow to ambulate but generally well-appearing, no acute distress Psychiatric: alert and oriented x3, cooperative Eyes: extraocular movements intact, anicteric, conjunctiva pink Mouth: oral pharynx moist, no lesions Neck: supple no lymphadenopathy Cardiovascular: heart regular rate and rhythm, no murmur Lungs: clear to auscultation bilaterally Abdomen: soft, obese, nontender, nondistended, no obvious ascites, no peritoneal signs, normal bowel sounds, no organomegaly Rectal:  Omitted. Hemoccult-positive studies recently as described Extremities: no lower extremity edema bilaterally Skin: no lesions on visible extremities Neuro: No focal deficits.   ASSESSMENT:  #1. Iron deficiency anemia and Hemoccult-positive stool. Patient's anemia is most certainly secondary to  chronic GI blood losses. We discussed possibilities such as colon cancer, AVMs, erosions related to hiatal hernia, and ulcer disease. Reviewing the chart, it looks like the patient has had chronic iron deficiency anemia. #2. GERD with hiatal hernia. Asymptomatic on PPI. #3. History of adenomatous colon polyps. Multiple prior colonoscopies. No neoplasia on most recent exam in 2007 #4. Severe diverticulosis with colonic stenosis #5. General medical problems   PLAN:  #1.     We discussed the optimal GI workup including optical colonoscopy with upper endoscopy (and possible capsule endoscopy). I discussed with her that it was likely she Linnemann have Cameron erosions from a hiatal hernia or possibly NSAID-induced that lesions. As well, possibly AVMs. Though she could have colon cancer, certainly, it would seem less likely given the chronicity of the anemia and negative colonoscopy in 2007. However, I did explain that lesions could be missed in the face of severe diverticular disease. The patient was somewhat apprehensive about proceeding, must absolutely necessary. We had a long discussion regarding the options.            To this end, she decided to forego procedures at this time. As such, I have recommended continuing daily PPI indefinitely, daily iron supplementation indefinitely, avoid unnecessary aspirin and NSAIDs, alternative medication for arthritic pain (she states that Vicodin works without side effects) to be administered by her primary provider, and monthly CBCs with her primary provider to monitor hemoglobin levels. GI followup will be on an as-needed basis.

## 2013-03-17 NOTE — Patient Instructions (Addendum)
Please follow-up with your physician

## 2013-04-28 ENCOUNTER — Other Ambulatory Visit: Payer: Self-pay | Admitting: *Deleted

## 2013-04-28 MED ORDER — TRAMADOL HCL 50 MG PO TABS: 50.0000 mg | ORAL_TABLET | Freq: Three times a day (TID) | ORAL | Status: DC | PRN

## 2013-06-15 ENCOUNTER — Encounter: Payer: Self-pay | Admitting: Internal Medicine

## 2013-06-15 ENCOUNTER — Ambulatory Visit (INDEPENDENT_AMBULATORY_CARE_PROVIDER_SITE_OTHER): Payer: Medicare HMO | Admitting: Internal Medicine

## 2013-06-15 VITALS — BP 140/88 | HR 67 | Temp 97.6°F | Resp 20 | Wt 190.0 lb

## 2013-06-15 DIAGNOSIS — I10 Essential (primary) hypertension: Secondary | ICD-10-CM

## 2013-06-15 DIAGNOSIS — N760 Acute vaginitis: Secondary | ICD-10-CM

## 2013-06-15 DIAGNOSIS — E7800 Pure hypercholesterolemia, unspecified: Secondary | ICD-10-CM

## 2013-06-15 DIAGNOSIS — I2510 Atherosclerotic heart disease of native coronary artery without angina pectoris: Secondary | ICD-10-CM

## 2013-06-15 MED ORDER — METRONIDAZOLE 500 MG PO TABS: 500.0000 mg | ORAL_TABLET | Freq: Three times a day (TID) | ORAL | Status: DC

## 2013-06-15 NOTE — Patient Instructions (Signed)
Take your antibiotic as prescribed until ALL of it is gone, but stop if you develop a rash, swelling, or any side effects of the medication.  Contact our office as soon as possible if  there are side effects of the medication.  Call or return to clinic prn if these symptoms worsen or fail to improve as anticipated. Vaginitis Vaginitis is an inflammation of the vagina. It is most often caused by a change in the normal balance of the bacteria and yeast that live in the vagina. This change in balance causes an overgrowth of certain bacteria or yeast, which causes the inflammation. There are different types of vaginitis, but the most common types are:  Bacterial vaginosis.  Yeast infection (candidiasis).  Trichomoniasis vaginitis. This is a sexually transmitted infection (STI).  Viral vaginitis.  Atropic vaginitis.  Allergic vaginitis. CAUSES  The cause depends on the type of vaginitis. Vaginitis can be caused by:  Bacteria (bacterial vaginosis).  Yeast (yeast infection).  A parasite (trichomoniasis vaginitis)  A virus (viral vaginitis).  Low hormone levels (atrophic vaginitis). Low hormone levels can occur during pregnancy, breastfeeding, or after menopause.  Irritants, such as bubble baths, scented tampons, and feminine sprays (allergic vaginitis). Other factors can change the normal balance of the yeast and bacteria that live in the vagina. These include:  Antibiotic medicines.  Poor hygiene.  Diaphragms, vaginal sponges, spermicides, birth control pills, and intrauterine devices (IUD).  Sexual intercourse.  Infection.  Uncontrolled diabetes.  A weakened immune system. SYMPTOMS  Symptoms can vary depending on the cause of the vaginitis. Common symptoms include:  Abnormal vaginal discharge.  The discharge is white, gray, or yellow with bacterial vaginosis.  The discharge is thick, white, and cheesy with a yeast infection.  The discharge is frothy and yellow or  greenish with trichomoniasis.  A bad vaginal odor.  The odor is fishy with bacterial vaginosis.  Vaginal itching, pain, or swelling.  Painful intercourse.  Pain or burning when urinating. Sometimes, there are no symptoms. TREATMENT  Treatment will vary depending on the type of infection.   Bacterial vaginosis and trichomoniasis are often treated with antibiotic creams or pills.  Yeast infections are often treated with antifungal medicines, such as vaginal creams or suppositories.  Viral vaginitis has no cure, but symptoms can be treated with medicines that relieve discomfort. Your sexual partner should be treated as well.  Atrophic vaginitis [place] be treated with an estrogen cream, pill, suppository, or vaginal ring. If vaginal dryness occurs, lubricants and moisturizing creams [place] help. You Navia be told to avoid scented soaps, sprays, or douches.  Allergic vaginitis treatment involves quitting the use of the product that is causing the problem. Vaginal creams can be used to treat the symptoms. HOME CARE INSTRUCTIONS   Take all medicines as directed by your caregiver.  Keep your genital area clean and dry. Avoid soap and only rinse the area with water.  Avoid douching. It can remove the healthy bacteria in the vagina.  Do not use tampons or have sexual intercourse until your vaginitis has been treated. Use sanitary pads while you have vaginitis.  Wipe from front to back. This avoids the spread of bacteria from the rectum to the vagina.  Let air reach your genital area.  Wear cotton underwear to decrease moisture buildup.  Avoid wearing underwear while you sleep until your vaginitis is gone.  Avoid tight pants and underwear or nylons without a cotton panel.  Take off wet clothing (especially bathing suits) as soon  as possible.  Use mild, non-scented products. Avoid using irritants, such as:  Scented feminine sprays.  Fabric softeners.  Scented detergents.  Scented  tampons.  Scented soaps or bubble baths.  Practice safe sex and use condoms. Condoms [place] prevent the spread of trichomoniasis and viral vaginitis. SEEK MEDICAL CARE IF:   You have abdominal pain.  You have a fever or persistent symptoms for more than 2 3 days.  You have a fever and your symptoms suddenly get worse. Document Released: 04/28/2007 Document Revised: 03/25/2012 Document Reviewed: 12/12/2011 [facility] Patient Information 2014 [place], [place].

## 2013-06-15 NOTE — Progress Notes (Signed)
Pre-visit discussion using our clinic review tool. No additional management support is needed unless otherwise documented below in the visit note.  

## 2013-06-15 NOTE — Progress Notes (Signed)
Subjective:    Patient ID: [name], female    DOB: [birth year], 79 y.o.   MRN: [id number]  HPI Pre-visit discussion using our clinic review tool. No additional management support is needed unless otherwise documented below in the visit note.  78 year old patient who stable medical problems include coronary artery disease chronic diastolic heart failure and essential hypertension. She has a history also of dyslipidemia. Her chief complaint today is a vaginal discharge of 2 months duration. She describes some mild abdominal cramping as well and a sense of irritation discharge is described as grayish in color. She has had a complete hysterectomy  Past Medical History  Diagnosis Date  . ANEMIA 06/30/2007  . COLONIC POLYPS, HX OF 01/21/2007  . COLONIC POLYPS, HX OF 01/21/2007  . CORONARY ARTERY DISEASE 04/05/2008  . DEGENERATIVE JOINT DISEASE 05/19/2007  . DEPRESSION 02/07/2010  . DIASTOLIC HEART FAILURE, CHRONIC 02/27/2009  . DIVERTICULOSIS, COLON 01/21/2007  . DYSPNEA 11/13/2009  . Edema 11/13/2009  . GERD 01/21/2007  . HIATAL HERNIA WITH REFLUX 01/21/2007  . HYPERTENSION 01/21/2007  . LIMB PAIN 11/13/2009  . NECK PAIN, CHRONIC 02/13/2010  . Pure hypercholesterolemia 04/05/2008    History   Social History  . Marital Status: Married    Spouse Name: N/A    Number of Children: N/A  . Years of Education: N/A   Occupational History  . Not on file.   Social History Main Topics  . Smoking status: Former Smoker    Quit date: 07/16/1967  . Smokeless tobacco: Never Used  . Alcohol Use: No  . Drug Use: No  . Sexual Activity: Not on file   Other Topics Concern  . Not on file   Social History Narrative  . No narrative on file    Past Surgical History  Procedure Laterality Date  . Appendectomy    . Cholecystectomy    . Abdominal hysterectomy    . Lumbar laminectomy    . Total knee arthroplasty    . Rotator cuff repair    . Cardiac stent    . Neck surgery      Family History  Problem  Relation Age of Onset  . Osteoarthritis Mother   . Heart failure Mother     No Known Allergies  Current Outpatient Prescriptions on File Prior to Visit  Medication Sig Dispense Refill  . baclofen (LIORESAL) 10 MG tablet Take 1 tablet (10 mg total) by mouth 3 (three) times daily.  90 tablet  5  . benazepril (LOTENSIN) 40 MG tablet Take 1 tablet (40 mg total) by mouth daily.  90 tablet  6  . benzonatate (TESSALON) 100 MG capsule Take 1 capsule (100 mg total) by mouth 3 (three) times daily as needed for cough.  30 capsule  0  . conjugated estrogens (PREMARIN) vaginal cream Place vaginally as needed.  42.5 g  2  . ferrous sulfate 325 (65 FE) MG tablet Take 325 mg by mouth daily with breakfast.       . furosemide (LASIX) 20 MG tablet Take 1 tablet (20 mg total) by mouth daily as needed.  90 tablet  6  . gabapentin (NEURONTIN) 600 MG tablet Take 1 tablet (600 mg total) by mouth 3 (three) times daily.  270 tablet  6  . HYDROcodone-acetaminophen (NORCO/VICODIN) 5-325 MG per tablet TAKE ONE TABLET BY MOUTH EVERY 6 HOURS AS NEEDED FOR PAIN  90 tablet  2  . LORazepam (ATIVAN) 0.5 MG tablet Take 1 tablet (0.5 mg total)  by mouth every 8 (eight) hours as needed for anxiety.  180 tablet  1  . metoprolol (LOPRESSOR) 50 MG tablet Take 1 tablet (50 mg total) by mouth 2 (two) times daily.  180 tablet  3  . nitroGLYCERIN (NITROSTAT) 0.4 MG SL tablet Place 0.4 mg under the tongue every 5 (five) minutes as needed.        Marland Kitchen omeprazole (PRILOSEC) 40 MG capsule Take 1 capsule (40 mg total) by mouth daily.  90 capsule  3  . sertraline (ZOLOFT) 50 MG tablet Take 1 tablet (50 mg total) by mouth daily.  90 tablet  6  . simvastatin (ZOCOR) 40 MG tablet Take 1 tablet (40 mg total) by mouth at bedtime.  90 tablet  6  . spironolactone (ALDACTONE) 25 MG tablet Take 1 tablet (25 mg total) by mouth daily.  90 tablet  6  . traMADol (ULTRAM) 50 MG tablet Take 1 tablet (50 mg total) by mouth every 8 (eight) hours as needed for  pain.  60 tablet  3  . traZODone (DESYREL) 100 MG tablet Take 0.5 tablets (50 mg total) by mouth at bedtime. 1/2 at bedtime  90 tablet  6  . triamcinolone cream (KENALOG) 0.1 % APPLY TOPICALLY TWICE DAILY  30 g  3   No current facility-administered medications on file prior to visit.    BP 140/88  Pulse 67  Temp(Src) 97.6 F (36.4 C) (Oral)  Resp 20  Wt 190 lb (86.183 kg)  SpO2 95%       Review of Systems  Constitutional: Negative.   HENT: Negative for congestion, dental problem, hearing loss, rhinorrhea, sinus pressure, sore throat and tinnitus.   Eyes: Negative for pain, discharge and visual disturbance.  Respiratory: Negative for cough and shortness of breath.   Cardiovascular: Negative for chest pain, palpitations and leg swelling.  Gastrointestinal: Negative for nausea, vomiting, abdominal pain, diarrhea, constipation, blood in stool and abdominal distention.  Genitourinary: Positive for vaginal discharge and vaginal pain. Negative for dysuria, urgency, frequency, hematuria, flank pain, vaginal bleeding, difficulty urinating and pelvic pain.  Musculoskeletal: Negative for arthralgias, gait problem and joint swelling.  Skin: Negative for rash.  Neurological: Negative for dizziness, syncope, speech difficulty, weakness, numbness and headaches.  Hematological: Negative for adenopathy.  Psychiatric/Behavioral: Negative for behavioral problems, dysphoric mood and agitation. The patient is not nervous/anxious.        Objective:   Physical Exam  Constitutional: She is oriented to person, place, and time. She appears well-developed and well-nourished.  Overweight Pressure 130/80  HENT:  Head: Normocephalic.  Right Ear: External ear normal.  Left Ear: External ear normal.  Mouth/Throat: Oropharynx is clear and moist.  Eyes: Conjunctivae and EOM are normal. Pupils are equal, round, and reactive to light.  Neck: Normal range of motion. Neck supple. No thyromegaly present.    Cardiovascular: Normal rate, regular rhythm, normal heart sounds and intact distal pulses.   Pulmonary/Chest: Effort normal and breath sounds normal.  Abdominal: Soft. Bowel sounds are normal. She exhibits no distension and no mass. There is no tenderness. There is no rebound and no guarding.  Obese soft and nontender  Genitourinary: Vaginal discharge found.  Status post hysterectomy Thick grayish vaginal discharge  Musculoskeletal: Normal range of motion.  Lymphadenopathy:    She has no cervical adenopathy.  Neurological: She is alert and oriented to person, place, and time.  Skin: Skin is warm and dry. No rash noted.  Psychiatric: She has a normal mood and affect. Her  behavior is normal.          Assessment & Plan:   Nonspecific vaginitis. Will treat with metronidazole for 7 days Hypertension stable Coronary artery disease stable Diastolic heart failure stable

## 2013-06-17 ENCOUNTER — Telehealth: Payer: Self-pay | Admitting: Internal Medicine

## 2013-06-17 MED ORDER — HYDROCODONE-ACETAMINOPHEN 5-325 MG PO TABS: ORAL_TABLET | ORAL | Status: DC

## 2013-06-17 NOTE — Telephone Encounter (Signed)
Pt states she forgot to ask for rx refill at her recent visit.  Pt requesting refill of HYDROcodone-acetaminophen (NORCO/VICODIN) 5-325 MG per tablet

## 2013-06-17 NOTE — Telephone Encounter (Signed)
Pt notified Rx ready for pick up, will be at the front desk. Rx printed and signed, put at front desk.

## 2013-06-29 ENCOUNTER — Telehealth: Payer: Self-pay | Admitting: Internal Medicine

## 2013-06-29 NOTE — Telephone Encounter (Signed)
Asked Adair Laundry in scheduling to call pt and schedule an appointment for pt, okay to wait till Dr. Kirtland Bouchard get back.

## 2013-06-29 NOTE — Telephone Encounter (Signed)
[facility] Triage Call Report Triage Record Num: [id number] Operator: Geanie Berlin Patient Name: [name] Call Date & Time: 06/28/2013 5:25:05PM Patient Phone: [phone] PCP: Gordy Savers Patient Gender: Female PCP Fax : [phone] Patient DOB: 05-17-1930 [facility] Name[facility] Reason for Call: Caller: Ashyia/Patient; PCP: Eleonore Chiquito (Family Practice > 60yrs old); CB#: [phone]; Call regarding Vaginal discharge with odor; Onset: 04/15/13. Afebrile. Seen 06/15/13. Diagnosed with BV. Completed 10 days of Flagyl without improvement. Discharge is brown with scant amount of blood. Advised to see MD within 24 hours for discharge or vaginal symptoms worsening despite 72 hours or more of treatment per Vaginal Discharge or Irritation. No acute slots remain for 06/29/13; instructed to call office 06/29/13 for appointment . Protocol(s) Used: Vaginal Discharge or Irritation Recommended Outcome per Protocol: See Provider within 24 hours Reason for Outcome: Discharge or vaginal symptoms worsening despite 72 hours or more of treatment Care Advice: Call your provider if you develop constant low back pain, constant or worsening lower abdominal pain or tenderness, temperature of 100.5 F (38.1 C), or any temperature elevation if immunocompromised (such as diabetes, HIV/AIDS, renal disease, chemotherapy, organ transplant, or chronic steroid use). ~ ~ SYMPTOM / CONDITION MANAGEMENT Refrain from douching, using scented deodorant tampons, or nonprescription medication until evaluated by provider. Do not use feminine hygiene sprays. Use condoms during sex. ~ 12/

## 2013-06-30 ENCOUNTER — Ambulatory Visit: Payer: Medicare HMO | Admitting: Family

## 2013-07-07 ENCOUNTER — Encounter: Payer: Self-pay | Admitting: Internal Medicine

## 2013-07-07 ENCOUNTER — Ambulatory Visit (INDEPENDENT_AMBULATORY_CARE_PROVIDER_SITE_OTHER): Payer: Medicare HMO | Admitting: Internal Medicine

## 2013-07-07 VITALS — BP 160/80 | Temp 97.8°F | Wt 188.0 lb

## 2013-07-07 DIAGNOSIS — I10 Essential (primary) hypertension: Secondary | ICD-10-CM

## 2013-07-07 DIAGNOSIS — N760 Acute vaginitis: Secondary | ICD-10-CM

## 2013-07-07 DIAGNOSIS — M1990 Unspecified osteoarthritis, unspecified site: Secondary | ICD-10-CM

## 2013-07-07 MED ORDER — TRAMADOL HCL 50 MG PO TABS: 50.0000 mg | ORAL_TABLET | Freq: Three times a day (TID) | ORAL | Status: DC | PRN

## 2013-07-07 MED ORDER — ESTROGENS, CONJUGATED 0.625 MG/GM VA CREA: TOPICAL_CREAM | VAGINAL | Status: DC | PRN

## 2013-07-07 MED ORDER — FLUCONAZOLE 150 MG PO TABS: 150.0000 mg | ORAL_TABLET | Freq: Once | ORAL | Status: DC

## 2013-07-07 MED ORDER — HYDROCODONE-ACETAMINOPHEN 5-325 MG PO TABS: ORAL_TABLET | ORAL | Status: DC

## 2013-07-07 MED ORDER — CLINDAMYCIN HCL 300 MG PO CAPS: 300.0000 mg | ORAL_CAPSULE | Freq: Three times a day (TID) | ORAL | Status: DC

## 2013-07-07 NOTE — Patient Instructions (Signed)
Hold simvastatin for 7 daysVaginitis Vaginitis is an inflammation of the vagina. It is most often caused by a change in the normal balance of the bacteria and yeast that live in the vagina. This change in balance causes an overgrowth of certain bacteria or yeast, which causes the inflammation. There are different types of vaginitis, but the most common types are:  Bacterial vaginosis.  Yeast infection (candidiasis).  Trichomoniasis vaginitis. This is a sexually transmitted infection (STI).  Viral vaginitis.  Atropic vaginitis.  Allergic vaginitis. CAUSES  The cause depends on the type of vaginitis. Vaginitis can be caused by:  Bacteria (bacterial vaginosis).  Yeast (yeast infection).  A parasite (trichomoniasis vaginitis)  A virus (viral vaginitis).  Low hormone levels (atrophic vaginitis). Low hormone levels can occur during pregnancy, breastfeeding, or after menopause.  Irritants, such as bubble baths, scented tampons, and feminine sprays (allergic vaginitis). Other factors can change the normal balance of the yeast and bacteria that live in the vagina. These include:  Antibiotic medicines.  Poor hygiene.  Diaphragms, vaginal sponges, spermicides, birth control pills, and intrauterine devices (IUD).  Sexual intercourse.  Infection.  Uncontrolled diabetes.  A weakened immune system. SYMPTOMS  Symptoms can vary depending on the cause of the vaginitis. Common symptoms include:  Abnormal vaginal discharge.  The discharge is white, gray, or yellow with bacterial vaginosis.  The discharge is thick, white, and cheesy with a yeast infection.  The discharge is frothy and yellow or greenish with trichomoniasis.  A bad vaginal odor.  The odor is fishy with bacterial vaginosis.  Vaginal itching, pain, or swelling.  Painful intercourse.  Pain or burning when urinating. Sometimes, there are no symptoms. TREATMENT  Treatment will vary depending on the type of  infection.   Bacterial vaginosis and trichomoniasis are often treated with antibiotic creams or pills.  Yeast infections are often treated with antifungal medicines, such as vaginal creams or suppositories.  Viral vaginitis has no cure, but symptoms can be treated with medicines that relieve discomfort. Your sexual partner should be treated as well.  Atrophic vaginitis [place] be treated with an estrogen cream, pill, suppository, or vaginal ring. If vaginal dryness occurs, lubricants and moisturizing creams [place] help. You [place] be told to avoid scented soaps, sprays, or douches.  Allergic vaginitis treatment involves quitting the use of the product that is causing the problem. Vaginal creams can be used to treat the symptoms. HOME CARE INSTRUCTIONS   Take all medicines as directed by your caregiver.  Keep your genital area clean and dry. Avoid soap and only rinse the area with water.  Avoid douching. It can remove the healthy bacteria in the vagina.  Do not use tampons or have sexual intercourse until your vaginitis has been treated. Use sanitary pads while you have vaginitis.  Wipe from front to back. This avoids the spread of bacteria from the rectum to the vagina.  Let air reach your genital area.  Wear cotton underwear to decrease moisture buildup.  Avoid wearing underwear while you sleep until your vaginitis is gone.  Avoid tight pants and underwear or nylons without a cotton panel.  Take off wet clothing (especially bathing suits) as soon as possible.  Use mild, non-scented products. Avoid using irritants, such as:  Scented feminine sprays.  Fabric softeners.  Scented detergents.  Scented tampons.  Scented soaps or bubble baths.  Practice safe sex and use condoms. Condoms [place] prevent the spread of trichomoniasis and viral vaginitis. SEEK MEDICAL CARE IF:   You have  abdominal pain.  You have a fever or persistent symptoms for more than 2 3 days.  You have a fever and  your symptoms suddenly get worse. Document Released: 04/28/2007 Document Revised: 03/25/2012 Document Reviewed: 12/12/2011 [facility] Patient Information 2014 [place], [place].

## 2013-07-07 NOTE — Progress Notes (Signed)
Subjective:    Patient ID: [name], female    DOB: October 14, 1929, 79 y.o.   MRN: [id number]  HPI  79 year old patient who has 2 concerns. She has been seen by orthopedics recently due to traumatic  right knee pain. The knee continues to be warm and tender. Her chief complaint is persistent vaginal discharge. The discharge is described as a thick brownish discharge. She was seen last month and treated for nonspecific vaginitis with metronidazole. This was not helpful. She does have a history of atrophic vaginitis and Premarin vaginal cream was refilled today  Past Medical History  Diagnosis Date  . ANEMIA 06/30/2007  . COLONIC POLYPS, HX OF 01/21/2007  . COLONIC POLYPS, HX OF 01/21/2007  . CORONARY ARTERY DISEASE 04/05/2008  . DEGENERATIVE JOINT DISEASE 05/19/2007  . DEPRESSION 02/07/2010  . DIASTOLIC HEART FAILURE, CHRONIC 02/27/2009  . DIVERTICULOSIS, COLON 01/21/2007  . DYSPNEA 11/13/2009  . Edema 11/13/2009  . GERD 01/21/2007  . HIATAL HERNIA WITH REFLUX 01/21/2007  . HYPERTENSION 01/21/2007  . LIMB PAIN 11/13/2009  . NECK PAIN, CHRONIC 02/13/2010  . Pure hypercholesterolemia 04/05/2008    History   Social History  . Marital Status: Married    Spouse Name: N/A    Number of Children: N/A  . Years of Education: N/A   Occupational History  . Not on file.   Social History Main Topics  . Smoking status: Former Smoker    Quit date: 07/16/1967  . Smokeless tobacco: Never Used  . Alcohol Use: No  . Drug Use: No  . Sexual Activity: Not on file   Other Topics Concern  . Not on file   Social History Narrative  . No narrative on file    Past Surgical History  Procedure Laterality Date  . Appendectomy    . Cholecystectomy    . Abdominal hysterectomy    . Lumbar laminectomy    . Total knee arthroplasty    . Rotator cuff repair    . Cardiac stent    . Neck surgery      Family History  Problem Relation Age of Onset  . Osteoarthritis Mother   . Heart failure Mother     No Known  Allergies  Current Outpatient Prescriptions on File Prior to Visit  Medication Sig Dispense Refill  . baclofen (LIORESAL) 10 MG tablet Take 1 tablet (10 mg total) by mouth 3 (three) times daily.  90 tablet  5  . benazepril (LOTENSIN) 40 MG tablet Take 1 tablet (40 mg total) by mouth daily.  90 tablet  6  . benzonatate (TESSALON) 100 MG capsule Take 1 capsule (100 mg total) by mouth 3 (three) times daily as needed for cough.  30 capsule  0  . ferrous sulfate 325 (65 FE) MG tablet Take 325 mg by mouth daily with breakfast.       . furosemide (LASIX) 20 MG tablet Take 1 tablet (20 mg total) by mouth daily as needed.  90 tablet  6  . gabapentin (NEURONTIN) 600 MG tablet Take 1 tablet (600 mg total) by mouth 3 (three) times daily.  270 tablet  6  . LORazepam (ATIVAN) 0.5 MG tablet Take 1 tablet (0.5 mg total) by mouth every 8 (eight) hours as needed for anxiety.  180 tablet  1  . metoprolol (LOPRESSOR) 50 MG tablet Take 1 tablet (50 mg total) by mouth 2 (two) times daily.  180 tablet  3  . metroNIDAZOLE (FLAGYL) 500 MG tablet Take 1 tablet (  500 mg total) by mouth 3 (three) times daily.  30 tablet  0  . nitroGLYCERIN (NITROSTAT) 0.4 MG SL tablet Place 0.4 mg under the tongue every 5 (five) minutes as needed.        Marland Kitchen omeprazole (PRILOSEC) 40 MG capsule Take 1 capsule (40 mg total) by mouth daily.  90 capsule  3  . sertraline (ZOLOFT) 50 MG tablet Take 1 tablet (50 mg total) by mouth daily.  90 tablet  6  . simvastatin (ZOCOR) 40 MG tablet Take 1 tablet (40 mg total) by mouth at bedtime.  90 tablet  6  . spironolactone (ALDACTONE) 25 MG tablet Take 1 tablet (25 mg total) by mouth daily.  90 tablet  6  . traZODone (DESYREL) 100 MG tablet Take 0.5 tablets (50 mg total) by mouth at bedtime. 1/2 at bedtime  90 tablet  6  . triamcinolone cream (KENALOG) 0.1 % APPLY TOPICALLY TWICE DAILY  30 g  3   No current facility-administered medications on file prior to visit.    BP 160/80  Temp(Src) 97.8 F (36.6  C) (Oral)  Wt 188 lb (85.276 kg)        Review of Systems  Constitutional: Negative.   HENT: Negative for congestion, dental problem, hearing loss, rhinorrhea, sinus pressure, sore throat and tinnitus.   Eyes: Negative for pain, discharge and visual disturbance.  Respiratory: Negative for cough and shortness of breath.   Cardiovascular: Negative for chest pain, palpitations and leg swelling.  Gastrointestinal: Negative for nausea, vomiting, abdominal pain, diarrhea, constipation, blood in stool and abdominal distention.  Genitourinary: Positive for vaginal discharge. Negative for dysuria, urgency, frequency, hematuria, flank pain, vaginal bleeding, difficulty urinating, vaginal pain and pelvic pain.  Musculoskeletal: Positive for arthralgias, gait problem and joint swelling.  Skin: Negative for rash.  Neurological: Negative for dizziness, syncope, speech difficulty, weakness, numbness and headaches.  Hematological: Negative for adenopathy.  Psychiatric/Behavioral: Negative for behavioral problems, dysphoric mood and agitation. The patient is not nervous/anxious.        Objective:   Physical Exam  Constitutional: She is oriented to person, place, and time. She appears well-developed and well-nourished.  HENT:  Head: Normocephalic.  Right Ear: External ear normal.  Left Ear: External ear normal.  Mouth/Throat: Oropharynx is clear and moist.  Eyes: Conjunctivae and EOM are normal. Pupils are equal, round, and reactive to light.  Neck: Normal range of motion. Neck supple. No thyromegaly present.  Cardiovascular: Normal rate, regular rhythm, normal heart sounds and intact distal pulses.   Pulmonary/Chest: Effort normal and breath sounds normal.  Abdominal: Soft. Bowel sounds are normal. She exhibits no mass. There is no tenderness.  Musculoskeletal: Normal range of motion.  Right knee warm to touch and slightly tender Status post right total knee replacement surgery    Lymphadenopathy:    She has no cervical adenopathy.  Neurological: She is alert and oriented to person, place, and time.  Skin: Skin is warm and dry. No rash noted.  Psychiatric: She has a normal mood and affect. Her behavior is normal.          Assessment & Plan:   Traumatic right knee pain. We'll continue to observe and treat symptomatically. Orthopedic followup if necessary Persistent nonspecific vaginitis. Will treat with a single dose of Diflucan and 7 days of oral Cleocin. If vaginitis persists will resume Premarin vaginal cream and consider gynecologic referral

## 2013-07-14 ENCOUNTER — Ambulatory Visit: Payer: Medicare HMO | Admitting: Internal Medicine

## 2013-07-20 ENCOUNTER — Other Ambulatory Visit: Payer: Self-pay | Admitting: Internal Medicine

## 2013-07-21 ENCOUNTER — Ambulatory Visit: Payer: Medicare HMO | Admitting: Internal Medicine

## 2013-08-02 ENCOUNTER — Encounter: Payer: Self-pay | Admitting: Internal Medicine

## 2013-08-02 ENCOUNTER — Ambulatory Visit (INDEPENDENT_AMBULATORY_CARE_PROVIDER_SITE_OTHER): Payer: Medicare HMO | Admitting: Internal Medicine

## 2013-08-02 VITALS — BP 150/90 | HR 63 | Temp 97.8°F | Resp 20 | Ht 63.5 in | Wt 186.0 lb

## 2013-08-02 DIAGNOSIS — N898 Other specified noninflammatory disorders of vagina: Secondary | ICD-10-CM

## 2013-08-02 DIAGNOSIS — I10 Essential (primary) hypertension: Secondary | ICD-10-CM

## 2013-08-02 DIAGNOSIS — Z0000 Encounter for general adult medical examination without abnormal findings: Secondary | ICD-10-CM

## 2013-08-02 DIAGNOSIS — M1990 Unspecified osteoarthritis, unspecified site: Secondary | ICD-10-CM

## 2013-08-02 NOTE — Progress Notes (Signed)
Pre-visit discussion using our clinic review tool. No additional management support is needed unless otherwise documented below in the visit note.  

## 2013-08-02 NOTE — Patient Instructions (Signed)
Orthopedic and gynecologic followup as discussed  Please check your blood pressure on a regular basis.  If it is consistently greater than 150/90, please make an office appointment.  Return in 3 months for follow-up

## 2013-08-02 NOTE — Progress Notes (Signed)
Subjective:    Patient ID: [name], female    DOB: [birth year], 80 y.o.   MRN: [id number]  HPI  80 year old patient who is seen today for followup.  She continues to have considerable right knee pain after a fall that occurred on December 6. She requires a referral for orthopedic followup. She is status post right total knee replacement surgery in September of 2012. Knee is still painful warm and interferes with daily activities.  She also continues to be bothered by a vaginal discharge. She is status post hysterectomy at age 53. She has been treated with metronidazole and Cleocin vaginal cream as well as a single dose of Diflucan. She remains on Premarin vaginal cream. She states her discharge has not improved.  Past Medical History  Diagnosis Date  . ANEMIA 06/30/2007  . COLONIC POLYPS, HX OF 01/21/2007  . COLONIC POLYPS, HX OF 01/21/2007  . CORONARY ARTERY DISEASE 04/05/2008  . DEGENERATIVE JOINT DISEASE 05/19/2007  . DEPRESSION 02/07/2010  . DIASTOLIC HEART FAILURE, CHRONIC 02/27/2009  . DIVERTICULOSIS, COLON 01/21/2007  . DYSPNEA 11/13/2009  . Edema 11/13/2009  . GERD 01/21/2007  . HIATAL HERNIA WITH REFLUX 01/21/2007  . HYPERTENSION 01/21/2007  . LIMB PAIN 11/13/2009  . NECK PAIN, CHRONIC 02/13/2010  . Pure hypercholesterolemia 04/05/2008    History   Social History  . Marital Status: Married    Spouse Name: N/A    Number of Children: N/A  . Years of Education: N/A   Occupational History  . Not on file.   Social History Main Topics  . Smoking status: Former Smoker    Quit date: 07/16/1967  . Smokeless tobacco: Never Used  . Alcohol Use: No  . Drug Use: No  . Sexual Activity: Not on file   Other Topics Concern  . Not on file   Social History Narrative  . No narrative on file    Past Surgical History  Procedure Laterality Date  . Appendectomy    . Cholecystectomy    . Abdominal hysterectomy    . Lumbar laminectomy    . Total knee arthroplasty    . Rotator cuff repair     . Cardiac stent    . Neck surgery      Family History  Problem Relation Age of Onset  . Osteoarthritis Mother   . Heart failure Mother     No Known Allergies  Current Outpatient Prescriptions on File Prior to Visit  Medication Sig Dispense Refill  . baclofen (LIORESAL) 10 MG tablet Take 1 tablet (10 mg total) by mouth 3 (three) times daily.  90 tablet  5  . benazepril (LOTENSIN) 40 MG tablet Take 1 tablet (40 mg total) by mouth daily.  90 tablet  6  . benzonatate (TESSALON) 100 MG capsule Take 1 capsule (100 mg total) by mouth 3 (three) times daily as needed for cough.  30 capsule  0  . conjugated estrogens (PREMARIN) vaginal cream Place vaginally as needed.  42.5 g  2  . ferrous sulfate 325 (65 FE) MG tablet Take 325 mg by mouth daily with breakfast.       . furosemide (LASIX) 20 MG tablet Take 1 tablet (20 mg total) by mouth daily as needed.  90 tablet  6  . gabapentin (NEURONTIN) 600 MG tablet Take 1 tablet (600 mg total) by mouth 3 (three) times daily.  270 tablet  6  . HYDROcodone-acetaminophen (NORCO/VICODIN) 5-325 MG per tablet TAKE ONE TABLET BY MOUTH EVERY 6 HOURS  AS NEEDED FOR PAIN  90 tablet  0  . LORazepam (ATIVAN) 0.5 MG tablet TAKE ONE TABLET BY MOUTH EVERY 8 HOURS AS NEEDED FOR ANXIETY  180 tablet  0  . metoprolol (LOPRESSOR) 50 MG tablet Take 1 tablet (50 mg total) by mouth 2 (two) times daily.  180 tablet  3  . nitroGLYCERIN (NITROSTAT) 0.4 MG SL tablet Place 0.4 mg under the tongue every 5 (five) minutes as needed.        . sertraline (ZOLOFT) 50 MG tablet Take 1 tablet (50 mg total) by mouth daily.  90 tablet  6  . simvastatin (ZOCOR) 40 MG tablet Take 1 tablet (40 mg total) by mouth at bedtime.  90 tablet  6  . spironolactone (ALDACTONE) 25 MG tablet Take 1 tablet (25 mg total) by mouth daily.  90 tablet  6  . traMADol (ULTRAM) 50 MG tablet Take 1 tablet (50 mg total) by mouth every 8 (eight) hours as needed.  120 tablet  3  . traZODone (DESYREL) 100 MG tablet Take  0.5 tablets (50 mg total) by mouth at bedtime. 1/2 at bedtime  90 tablet  6  . triamcinolone cream (KENALOG) 0.1 % APPLY TOPICALLY TWICE DAILY  30 g  3  . omeprazole (PRILOSEC) 40 MG capsule Take 1 capsule (40 mg total) by mouth daily.  90 capsule  3   No current facility-administered medications on file prior to visit.    BP 150/90  Pulse 63  Temp(Src) 97.8 F (36.6 C) (Oral)  Resp 20  Ht 5' 3.5" (1.613 m)  Wt 186 lb (84.369 kg)  BMI 32.43 kg/m2  SpO2 95%       Review of Systems  Constitutional: Negative.   HENT: Negative for congestion, dental problem, hearing loss, rhinorrhea, sinus pressure, sore throat and tinnitus.   Eyes: Negative for pain, discharge and visual disturbance.  Respiratory: Negative for cough and shortness of breath.   Cardiovascular: Negative for chest pain, palpitations and leg swelling.  Gastrointestinal: Negative for nausea, vomiting, abdominal pain, diarrhea, constipation, blood in stool and abdominal distention.  Genitourinary: Positive for vaginal discharge. Negative for dysuria, urgency, frequency, hematuria, flank pain, vaginal bleeding, difficulty urinating, vaginal pain and pelvic pain.  Musculoskeletal: Positive for arthralgias, gait problem and joint swelling.  Skin: Negative for rash.  Neurological: Negative for dizziness, syncope, speech difficulty, weakness, numbness and headaches.  Hematological: Negative for adenopathy.  Psychiatric/Behavioral: Negative for behavioral problems, dysphoric mood and agitation. The patient is not nervous/anxious.        Objective:   Physical Exam  Constitutional: She appears well-developed and well-nourished. No distress.  Musculoskeletal:  Social scars involving both knees following bilateral total knee replacement surgery Right knee remains quite warm to touch and slightly tender diffusely          Assessment & Plan:   Posttraumatic right knee pain Chronic vaginal discharge. Will set up for  gynecologic evaluation

## 2013-08-02 NOTE — Addendum Note (Signed)
Addended by: Bluford Kaufmann F on: 08/02/2013 11:04 AM   Modules accepted: Orders

## 2013-08-11 ENCOUNTER — Telehealth: Payer: Self-pay | Admitting: Internal Medicine

## 2013-08-11 NOTE — Telephone Encounter (Signed)
[facility] referral: appt today Salli Quarry- [id number]- Dr. Belenda Cruise Dean-MPI# [id number] knee pain ICD-719.46 [phone]

## 2013-08-18 ENCOUNTER — Other Ambulatory Visit: Payer: Self-pay | Admitting: Orthopedic Surgery

## 2013-08-18 ENCOUNTER — Other Ambulatory Visit: Payer: Self-pay | Admitting: Internal Medicine

## 2013-08-18 ENCOUNTER — Telehealth: Payer: Self-pay | Admitting: Internal Medicine

## 2013-08-18 DIAGNOSIS — R531 Weakness: Secondary | ICD-10-CM

## 2013-08-18 DIAGNOSIS — M25569 Pain in unspecified knee: Secondary | ICD-10-CM

## 2013-08-18 DIAGNOSIS — M25551 Pain in right hip: Secondary | ICD-10-CM

## 2013-08-18 NOTE — Telephone Encounter (Signed)
Relevant patient education mailed to patient.  

## 2013-08-19 ENCOUNTER — Telehealth: Payer: Self-pay | Admitting: Internal Medicine

## 2013-08-19 MED ORDER — GABAPENTIN 600 MG PO TABS: 600.0000 mg | ORAL_TABLET | Freq: Three times a day (TID) | ORAL | Status: DC

## 2013-08-19 MED ORDER — SPIRONOLACTONE 25 MG PO TABS: 25.0000 mg | ORAL_TABLET | Freq: Every day | ORAL | Status: DC

## 2013-08-19 NOTE — Telephone Encounter (Signed)
Rx's sent to [facility] 

## 2013-08-19 NOTE — Telephone Encounter (Signed)
[facility] MAIL ORDER requesting new scripts for the following:  gabapentin (NEURONTIN) 600 MG tablet spironolactone (ALDACTONE) 25 MG tablet

## 2013-08-20 ENCOUNTER — Ambulatory Visit
Admission: RE | Admit: 2013-08-20 | Discharge: 2013-08-20 | Disposition: A | Discharge status: Home / Self Care | Payer: Commercial Managed Care - HMO | Source: Ambulatory Visit | Attending: Orthopedic Surgery | Admitting: Orthopedic Surgery

## 2013-08-20 DIAGNOSIS — M25551 Pain in right hip: Secondary | ICD-10-CM

## 2013-08-20 DIAGNOSIS — R531 Weakness: Secondary | ICD-10-CM

## 2013-08-20 DIAGNOSIS — M25569 Pain in unspecified knee: Secondary | ICD-10-CM

## 2013-08-21 ENCOUNTER — Other Ambulatory Visit: Payer: Medicare HMO

## 2013-08-27 ENCOUNTER — Other Ambulatory Visit (HOSPITAL_COMMUNITY): Payer: Self-pay | Admitting: Orthopedic Surgery

## 2013-08-30 ENCOUNTER — Other Ambulatory Visit: Payer: Self-pay | Admitting: Internal Medicine

## 2013-09-01 ENCOUNTER — Encounter (HOSPITAL_COMMUNITY): Payer: Self-pay | Admitting: Pharmacy Technician

## 2013-09-02 ENCOUNTER — Other Ambulatory Visit: Payer: Self-pay | Admitting: *Deleted

## 2013-09-02 MED ORDER — SERTRALINE HCL 50 MG PO TABS: 50.0000 mg | ORAL_TABLET | Freq: Every day | ORAL | Status: DC

## 2013-09-02 MED ORDER — METOPROLOL TARTRATE 50 MG PO TABS: 50.0000 mg | ORAL_TABLET | Freq: Two times a day (BID) | ORAL | Status: DC

## 2013-09-02 MED ORDER — FUROSEMIDE 20 MG PO TABS: 20.0000 mg | ORAL_TABLET | Freq: Every day | ORAL | Status: DC | PRN

## 2013-09-02 MED ORDER — BENAZEPRIL HCL 40 MG PO TABS: 40.0000 mg | ORAL_TABLET | Freq: Every day | ORAL | Status: DC

## 2013-09-02 MED ORDER — SIMVASTATIN 40 MG PO TABS: 40.0000 mg | ORAL_TABLET | Freq: Every day | ORAL | Status: DC

## 2013-09-02 NOTE — Telephone Encounter (Signed)
Received voicemail from pt needing refills send to [facility]. Left message to call office.

## 2013-09-02 NOTE — Telephone Encounter (Signed)
Pt called back told her Rx's sent to [facility] as requested.

## 2013-09-06 NOTE — Pre-Procedure Instructions (Signed)
[name]  09/06/2013   Your procedure is scheduled on:  Tues, Mar 3 @ 12:00 PM  Report to [facility] Short Stay Entrance A  at 10:00 AM.  Call this number if you have problems the morning of surgery: [phone]   Remember:   Do not eat food or drink liquids after midnight.   Take these medicines the morning of surgery with A SIP OF WATER: Gabapentin(Neurontin),Pain Pill(if needed),Ativan(Lorazepam),Metoprolol(Lopressor),Omeprazole(Prilosec),Zoloft(Sertraline),and Ultram(Tramadol-if needed)               No Aspirin,Goody's,BC's,Aleve,Ibuprofen,Fish Oil,or any Herbal Medications   Do not wear jewelry, make-up or nail polish.  Do not wear lotions, powders, or perfumes. You [name] wear deodorant.  Do not shave 48 hours prior to surgery.   Do not bring valuables to the hospital.  [facility] is not responsible                  for any belongings or valuables.               Contacts, dentures or bridgework [name] not be worn into surgery.  Leave suitcase in the car. After surgery it [name] be brought to your room.  For patients admitted to the hospital, discharge time is determined by your                treatment team.               Special Instructions:  [facility] - Preparing for Surgery  Before surgery, you can play an important role.  Because skin is not sterile, your skin needs to be as free of germs as possible.  You can reduce the number of germs on you skin by washing with CHG (chlorahexidine gluconate) soap before surgery.  CHG is an antiseptic cleaner which kills germs and bonds with the skin to continue killing germs even after washing.  Please DO NOT use if you have an allergy to CHG or antibacterial soaps.  If your skin becomes reddened/irritated stop using the CHG and inform your nurse when you arrive at Short Stay.  Do not shave (including legs and underarms) for at least 48 hours prior to the first CHG shower.  You [name] shave your face.  Please follow these instructions  carefully:   1.  Shower with CHG Soap the night before surgery and the                                morning of Surgery.  2.  If you choose to wash your hair, wash your hair first as usual with your       normal shampoo.  3.  After you shampoo, rinse your hair and body thoroughly to remove the                      Shampoo.  4.  Use CHG as you would any other liquid soap.  You can apply chg directly       to the skin and wash gently with scrungie or a clean washcloth.  5.  Apply the CHG Soap to your body ONLY FROM THE NECK DOWN.        Do not use on open wounds or open sores.  Avoid contact with your eyes,       ears, mouth and genitals (private parts).  Wash genitals (private parts)       with  your normal soap.  6.  Wash thoroughly, paying special attention to the area where your surgery        will be performed.  7.  Thoroughly rinse your body with warm water from the neck down.  8.  DO NOT shower/wash with your normal soap after using and rinsing off       the CHG Soap.  9.  Pat yourself dry with a clean towel.            10.  Wear clean pajamas.            11.  Place clean sheets on your bed the night of your first shower and do not        sleep with pets.  Day of Surgery  Do not apply any lotions/deoderants the morning of surgery.  Please wear clean clothes to the hospital/surgery center.     Please read over the following fact sheets that you were given: Coughing and Deep Breathing,Signs and Symptoms of Infection,and Pain Management

## 2013-09-07 ENCOUNTER — Inpatient Hospital Stay (HOSPITAL_COMMUNITY)
Admission: RE | Admit: 2013-09-07 | Discharge: 2013-09-07 | Disposition: A | Discharge status: Home / Self Care | Payer: Medicare HMO | Source: Ambulatory Visit

## 2013-09-08 ENCOUNTER — Encounter (HOSPITAL_COMMUNITY)
Admission: RE | Admit: 2013-09-08 | Discharge: 2013-09-08 | Disposition: A | Discharge status: Home / Self Care | Payer: Medicare HMO | Source: Ambulatory Visit | Attending: Orthopedic Surgery | Admitting: Orthopedic Surgery

## 2013-09-08 ENCOUNTER — Other Ambulatory Visit (HOSPITAL_COMMUNITY): Payer: Self-pay | Admitting: Orthopedic Surgery

## 2013-09-08 ENCOUNTER — Emergency Department (HOSPITAL_COMMUNITY)
Admission: EM | Admit: 2013-09-08 | Discharge: 2013-09-08 | Disposition: A | Discharge status: Home / Self Care | Payer: Medicare HMO | Attending: Emergency Medicine | Admitting: Emergency Medicine

## 2013-09-08 ENCOUNTER — Encounter (HOSPITAL_COMMUNITY): Payer: Self-pay | Admitting: Emergency Medicine

## 2013-09-08 ENCOUNTER — Encounter (HOSPITAL_COMMUNITY)
Admission: RE | Admit: 2013-09-08 | Discharge: 2013-09-08 | Disposition: A | Discharge status: Home / Self Care | Payer: Medicare HMO | Source: Ambulatory Visit | Attending: Anesthesiology | Admitting: Anesthesiology

## 2013-09-08 ENCOUNTER — Encounter (HOSPITAL_COMMUNITY): Payer: Self-pay

## 2013-09-08 DIAGNOSIS — Z9889 Other specified postprocedural states: Secondary | ICD-10-CM | POA: Insufficient documentation

## 2013-09-08 DIAGNOSIS — I2510 Atherosclerotic heart disease of native coronary artery without angina pectoris: Secondary | ICD-10-CM | POA: Insufficient documentation

## 2013-09-08 DIAGNOSIS — I5032 Chronic diastolic (congestive) heart failure: Secondary | ICD-10-CM | POA: Insufficient documentation

## 2013-09-08 DIAGNOSIS — R899 Unspecified abnormal finding in specimens from other organs, systems and tissues: Secondary | ICD-10-CM

## 2013-09-08 DIAGNOSIS — Z01812 Encounter for preprocedural laboratory examination: Secondary | ICD-10-CM | POA: Insufficient documentation

## 2013-09-08 DIAGNOSIS — Z862 Personal history of diseases of the blood and blood-forming organs and certain disorders involving the immune mechanism: Secondary | ICD-10-CM | POA: Insufficient documentation

## 2013-09-08 DIAGNOSIS — F3289 Other specified depressive episodes: Secondary | ICD-10-CM | POA: Insufficient documentation

## 2013-09-08 DIAGNOSIS — K219 Gastro-esophageal reflux disease without esophagitis: Secondary | ICD-10-CM | POA: Insufficient documentation

## 2013-09-08 DIAGNOSIS — I10 Essential (primary) hypertension: Secondary | ICD-10-CM | POA: Insufficient documentation

## 2013-09-08 DIAGNOSIS — Z860100 Personal history of colon polyps, unspecified: Secondary | ICD-10-CM | POA: Insufficient documentation

## 2013-09-08 DIAGNOSIS — G8929 Other chronic pain: Secondary | ICD-10-CM | POA: Insufficient documentation

## 2013-09-08 DIAGNOSIS — M1990 Unspecified osteoarthritis, unspecified site: Secondary | ICD-10-CM | POA: Insufficient documentation

## 2013-09-08 DIAGNOSIS — R6889 Other general symptoms and signs: Secondary | ICD-10-CM | POA: Insufficient documentation

## 2013-09-08 DIAGNOSIS — IMO0002 Reserved for concepts with insufficient information to code with codable children: Secondary | ICD-10-CM | POA: Insufficient documentation

## 2013-09-08 DIAGNOSIS — E7800 Pure hypercholesterolemia, unspecified: Secondary | ICD-10-CM | POA: Insufficient documentation

## 2013-09-08 DIAGNOSIS — Z01818 Encounter for other preprocedural examination: Secondary | ICD-10-CM | POA: Insufficient documentation

## 2013-09-08 DIAGNOSIS — Z87891 Personal history of nicotine dependence: Secondary | ICD-10-CM | POA: Insufficient documentation

## 2013-09-08 DIAGNOSIS — Z79899 Other long term (current) drug therapy: Secondary | ICD-10-CM | POA: Insufficient documentation

## 2013-09-08 DIAGNOSIS — Z8701 Personal history of pneumonia (recurrent): Secondary | ICD-10-CM | POA: Insufficient documentation

## 2013-09-08 DIAGNOSIS — F329 Major depressive disorder, single episode, unspecified: Secondary | ICD-10-CM | POA: Insufficient documentation

## 2013-09-08 HISTORY — DX: Pneumonia, unspecified organism: J18.9

## 2013-09-08 LAB — CBC
HCT: 30.9 % — ABNORMAL LOW (ref 36.0–46.0)
Hemoglobin: 9.4 g/dL — ABNORMAL LOW (ref 12.0–15.0)
MCH: 24.4 pg — ABNORMAL LOW (ref 26.0–34.0)
MCHC: 30.4 g/dL (ref 30.0–36.0)
MCV: 80.1 fL (ref 78.0–100.0)
Platelets: 201 10*3/uL (ref 150–400)
RBC: 3.86 MIL/uL — ABNORMAL LOW (ref 3.87–5.11)
RDW: 16.1 % — ABNORMAL HIGH (ref 11.5–15.5)
WBC: 8.6 10*3/uL (ref 4.0–10.5)

## 2013-09-08 LAB — COMPREHENSIVE METABOLIC PANEL
ALT: 12 U/L (ref 0–35)
AST: 26 U/L (ref 0–37)
Albumin: 3.9 g/dL (ref 3.5–5.2)
Alkaline Phosphatase: 65 U/L (ref 39–117)
BUN: 38 mg/dL — ABNORMAL HIGH (ref 6–23)
CO2: 26 mEq/L (ref 19–32)
Calcium: 9.1 mg/dL (ref 8.4–10.5)
Chloride: 102 mEq/L (ref 96–112)
Creatinine, Ser: 1.38 mg/dL — ABNORMAL HIGH (ref 0.50–1.10)
GFR calc Af Amer: 40 mL/min — ABNORMAL LOW (ref >90)
GFR calc non Af Amer: 35 mL/min — ABNORMAL LOW (ref >90)
Glucose, Bld: 95 mg/dL (ref 70–99)
Potassium: 5.2 mEq/L (ref 3.7–5.3)
Sodium: 141 mEq/L (ref 137–147)
Total Bilirubin: <0.2 mg/dL — ABNORMAL LOW (ref 0.3–1.2)
Total Protein: 7.7 g/dL (ref 6.0–8.3)

## 2013-09-08 LAB — CBC WITH DIFFERENTIAL/PLATELET
Basophils Absolute: 0 10*3/uL (ref 0.0–0.1)
Basophils Relative: 0 % (ref 0–1)
Eosinophils Absolute: 0.1 10*3/uL (ref 0.0–0.7)
Eosinophils Relative: 1 % (ref 0–5)
HCT: 29.4 % — ABNORMAL LOW (ref 36.0–46.0)
Hemoglobin: 8.9 g/dL — ABNORMAL LOW (ref 12.0–15.0)
Lymphocytes Relative: 27 % (ref 12–46)
Lymphs Abs: 2 10*3/uL (ref 0.7–4.0)
MCH: 24.1 pg — ABNORMAL LOW (ref 26.0–34.0)
MCHC: 30.3 g/dL (ref 30.0–36.0)
MCV: 79.7 fL (ref 78.0–100.0)
Monocytes Absolute: 0.9 10*3/uL (ref 0.1–1.0)
Monocytes Relative: 11 % (ref 3–12)
Neutro Abs: 4.6 10*3/uL (ref 1.7–7.7)
Neutrophils Relative %: 61 % (ref 43–77)
Platelets: 188 10*3/uL (ref 150–400)
RBC: 3.69 MIL/uL — ABNORMAL LOW (ref 3.87–5.11)
RDW: 15.8 % — ABNORMAL HIGH (ref 11.5–15.5)
WBC: 7.4 10*3/uL (ref 4.0–10.5)

## 2013-09-08 LAB — BASIC METABOLIC PANEL
BUN: 39 mg/dL — ABNORMAL HIGH (ref 6–23)
CO2: 24 mEq/L (ref 19–32)
Calcium: 9.4 mg/dL (ref 8.4–10.5)
Chloride: 102 mEq/L (ref 96–112)
Creatinine, Ser: 1.49 mg/dL — ABNORMAL HIGH (ref 0.50–1.10)
GFR calc Af Amer: 37 mL/min — ABNORMAL LOW (ref >90)
GFR calc non Af Amer: 32 mL/min — ABNORMAL LOW (ref >90)
Glucose, Bld: 100 mg/dL — ABNORMAL HIGH (ref 70–99)
Potassium: 6 mEq/L — ABNORMAL HIGH (ref 3.7–5.3)
Sodium: 141 mEq/L (ref 137–147)

## 2013-09-08 NOTE — ED Provider Notes (Signed)
CSN: [id number]     Arrival date & time 09/08/13  1918 History   First MD Initiated Contact with Patient 09/08/13 2024     Chief Complaint  Patient presents with  . Recheck labs      HPI  Patient presents due to abnormal lab draw performed earlier today. Patient is to return for elective repair of her right patella. Patient currently denies any chest pain, dyspnea, lightheadedness and headache, neurologic symptoms or any other focal complaints. She does have right knee pain. Earlier today the patient was found to be hyperkalemic She denies new medication changes, dietary changes, activity changes.   Past Medical History  Diagnosis Date  . ANEMIA 06/30/2007  . COLONIC POLYPS, HX OF 01/21/2007  . COLONIC POLYPS, HX OF 01/21/2007  . CORONARY ARTERY DISEASE 04/05/2008  . DEGENERATIVE JOINT DISEASE 05/19/2007  . DEPRESSION 02/07/2010  . DIASTOLIC HEART FAILURE, CHRONIC 02/27/2009  . DIVERTICULOSIS, COLON 01/21/2007  . DYSPNEA 11/13/2009  . Edema 11/13/2009  . GERD 01/21/2007  . HIATAL HERNIA WITH REFLUX 01/21/2007  . HYPERTENSION 01/21/2007  . LIMB PAIN 11/13/2009  . NECK PAIN, CHRONIC 02/13/2010  . Pure hypercholesterolemia 04/05/2008  . PONV (postoperative nausea and vomiting)     Pt had problems with memory after procedure  . Colitis     Hx: of  . Pneumonia   . Bronchitis     Hx: of  . FHx: allergies     Hx;of  . Heart palpitations     Hx: of  . Headache(784.0)    Past Surgical History  Procedure Laterality Date  . Appendectomy    . Cholecystectomy    . Abdominal hysterectomy    . Lumbar laminectomy    . Total knee arthroplasty    . Rotator cuff repair    . Cardiac stent    . Neck surgery    . Cataract extraction w/ intraocular lens  implant, bilateral      Hx: of  . Cardiac catheterization      2009  . Tonsillectomy    . Dilation and curettage of uterus     Family History  Problem Relation Age of Onset  . Osteoarthritis Mother   . Heart failure Mother    History   Substance Use Topics  . Smoking status: Former Smoker    Quit date: 07/16/1967  . Smokeless tobacco: Never Used  . Alcohol Use: No   OB History   Grav Para Term Preterm Abortions TAB SAB Ect Mult Living                 Review of Systems  All other systems reviewed and are negative.      Allergies  Review of patient's allergies indicates no known allergies.  Home Medications   Current Outpatient Rx  Name  Route  Sig  Dispense  Refill  . baclofen (LIORESAL) 10 MG tablet   Oral   Take 10 mg by mouth 3 (three) times daily.         . benazepril (LOTENSIN) 40 MG tablet   Oral   Take 1 tablet (40 mg total) by mouth daily.   90 tablet   3   . ferrous sulfate 325 (65 FE) MG tablet   Oral   Take 325 mg by mouth daily with breakfast.          . furosemide (LASIX) 20 MG tablet   Oral   Take 1 tablet (20 mg total) by mouth daily as needed.  90 tablet   3   . gabapentin (NEURONTIN) 600 MG tablet   Oral   Take 1 tablet (600 mg total) by mouth 3 (three) times daily.   270 tablet   3   . HYDROcodone-acetaminophen (NORCO/VICODIN) 5-325 MG per tablet   Oral   Take 1 tablet by mouth every 6 (six) hours as needed for moderate pain.         Marland Kitchen LORazepam (ATIVAN) 0.5 MG tablet   Oral   Take 0.5 mg by mouth every 8 (eight) hours as needed for anxiety.         . metoprolol (LOPRESSOR) 50 MG tablet   Oral   Take 1 tablet (50 mg total) by mouth 2 (two) times daily.   180 tablet   3   . nitroGLYCERIN (NITROSTAT) 0.4 MG SL tablet   Sublingual   Place 0.4 mg under the tongue every 5 (five) minutes as needed for chest pain.          Marland Kitchen EXPIRED: omeprazole (PRILOSEC) 40 MG capsule   Oral   Take 1 capsule (40 mg total) by mouth daily.   90 capsule   3   . sertraline (ZOLOFT) 50 MG tablet   Oral   Take 1 tablet (50 mg total) by mouth daily.   90 tablet   1   . simvastatin (ZOCOR) 40 MG tablet   Oral   Take 1 tablet (40 mg total) by mouth at bedtime.   90  tablet   3   . spironolactone (ALDACTONE) 25 MG tablet   Oral   Take 1 tablet (25 mg total) by mouth daily.   90 tablet   3   . traMADol (ULTRAM) 50 MG tablet   Oral   Take by mouth every 6 (six) hours as needed for moderate pain.         . traZODone (DESYREL) 100 MG tablet   Oral   Take 50 mg by mouth at bedtime.         . triamcinolone cream (KENALOG) 0.1 %   Topical   Apply 1 application topically 2 (two) times daily as needed (Eczema on legs).          BP 133/48  Pulse 67  Temp(Src) 98.1 F (36.7 C) (Oral)  Resp 18  SpO2 97% Physical Exam  Nursing note and vitals reviewed. Constitutional: She is oriented to person, place, and time. She appears well-developed and well-nourished. No distress.  HENT:  Head: Normocephalic and atraumatic.  Eyes: Conjunctivae and EOM are normal.  Cardiovascular: Normal rate and regular rhythm.   Pulmonary/Chest: Effort normal and breath sounds normal. No stridor. No respiratory distress.  Abdominal: She exhibits no distension.  Musculoskeletal: She exhibits no edema.  Neurological: She is alert and oriented to person, place, and time. No cranial nerve deficit.  Skin: Skin is warm and dry.  Psychiatric: She has a normal mood and affect.    ED Course  Procedures (including critical care time) Labs Review Labs Reviewed  CBC WITH DIFFERENTIAL - Abnormal; Notable for the following:    RBC 3.69 (*)    Hemoglobin 8.9 (*)    HCT 29.4 (*)    MCH 24.1 (*)    RDW 15.8 (*)    All other components within normal limits  COMPREHENSIVE METABOLIC PANEL - Abnormal; Notable for the following:    BUN 38 (*)    Creatinine, Ser 1.38 (*)    Total Bilirubin <0.2 (*)  GFR calc non Af Amer 35 (*)    GFR calc Af Amer 40 (*)    All other components within normal limits   Imaging Review Dg Chest 2 View  09/08/2013   CLINICAL DATA:  Preop for right knee surgery  EXAM: CHEST  2 VIEW  COMPARISON:  04/05/2011  FINDINGS: Cardiomediastinal  silhouette is stable. There is tenting of the right hemidiaphragm. No acute infiltrate or pulmonary edema. Metallic fixation plate again noted cervical spine. Small to moderate hiatal hernia.  IMPRESSION: No active cardiopulmonary disease. Tenting of the right hemidiaphragm. Small to moderate size hiatal hernia.   Electronically Signed   By: Lahoma Crocker M.D.   On: 09/08/2013 10:46      MDM   Final diagnoses:  Abnormal laboratory test    I discussed all findings with the patient and her daughter.  Repeat labs do not demonstrate hyperkalemia, and the patient's potassium is appropriate for her.  Other labs result are also appropriate for the patient, including anemia.  Patient states that she continues to use iron supplements.  Patient will follow up with primary care, have repeat blood draw next week.   Carmin Muskrat, MD 09/08/13 2120

## 2013-09-08 NOTE — Progress Notes (Signed)
Pt denies SOB, chest pain and being under the care of a cardiologist in the past 2 years. Spoke with Maudie Mercury ( Dr. Randel Pigg nurse) to make MD aware that pt hgb is 9.4 and Potassium is 6.0. A request was made for cardiology notes and clearance since pt has a cardiac stent however, according to Maudie Mercury, no request for cardiac clearance is noted. Pt chart forwarded to [place], [place] ( anesthesia) for review of cardiac history and abnormal labs.

## 2013-09-08 NOTE — ED Notes (Signed)
Had pre op labs drawn today and Dr. Marlou Sa called and told her to come to the ED since her potassium was elevated

## 2013-09-08 NOTE — Discharge Instructions (Signed)
As discussed, your evaluation today has been largely reassuring.  But, it is important that you monitor your condition carefully, and do not hesitate to return to the ED if you develop new, or concerning changes in your condition. ? ?Otherwise, please follow-up with your physician for appropriate ongoing care. ? ?

## 2013-09-09 ENCOUNTER — Encounter (HOSPITAL_COMMUNITY): Payer: Self-pay

## 2013-09-09 NOTE — Progress Notes (Signed)
Anesthesia Chart Review:  Patient is a 80 year old female scheduled for right knee patellectomy on 09/14/13 by Dr. Marlou Sa. She sustained a right patella fracture following a fall in 06/2013.  History includes former smoker, CAD s/p BMS to RCA 03/6221, diastolic CHF, HTN, hypercholesterolemia, palpitations, dyspnea, GERD, hiatal hernia, iron deficiency anemia (on ferrous sulfate) with hemoccult positive stool (no neoplasia on colonoscopy 2007; she deferred further GI procedures offered by GI in 03/2013), diverticulosis, colitis, neck and back surgeries, cholecystectomy, tonsillectomy, hysterectomy, appendectomy, bilateral TKA--right TKA '12. For anesthesia issues she reported post-operative N/V and post-operative memory problems. PCP is Dr. Bluford Kaufmann. GI is Dr. Scarlette Shorts. Cardiologist has been Dr. Angelena Form (Dr. Haroldine Laws prior to that), but she has not been seen since 02/2011 prior to right TKA.   EKG on 01/25/13 showed SR, low voltage QRS, possible pulmonary disease pattern. According to PAT RN notes, she denied chest pain and SOB at her PAT appointment yesterday.  HR was 67 bpm and BP 133/48.  On 02/26/11 she had a normal stress nuclear study, EF 67%.  Echo on 12/01/09 showed: - Left ventricle: The cavity size was normal. Wall thickness was normal. The estimated ejection fraction was 60%. Wall motion was normal; there were no regional wall motion abnormalities. Doppler parameters are consistent with abnormal left ventricular relaxation (grade 1 diastolic dysfunction). - Mitral valve: Mitral regurgitation is moderate, when the PISA data is considered. - Right ventricle: The cavity size was normal. Systolic function was mildly reduced. - Tricuspid valve: Mild regurgitation. Peak RV-RA gradient: 37mm Hg (S).  Cardiac cath on 11/02/07 showed: Normal left main. LAD was a long vessel wrapping the apex. It gave off a fairly large diagonal branch in the proximal portion. There was calcium throughout the  proximal and mid LAD with diffuse 40% stenosis. The remainder of the LAD was a large vessel with minor luminal irregularities. In the midportion of the large diagonal there was approximately 70-75% focal stenosis. Moderate size left circumflex with 30% proximal stenosis and 50% stenosis in the mid AV groove. RCA with a large dominant vessel giving off RV branch, large PDA and 2 posterior laterals. There is 90% tubular stenosis in the mid section spanning the takeoff of the RV branch. EF 60% with no regional wall motion abnormalities. She subsequently underwent bare-metal stent to mid RCA on 11/04/2007.   CXR on 09/08/13 showed: No active cardiopulmonary disease. Tenting of the right hemidiaphragm. Small to moderate size hiatal hernia.   Preoperative labs from PAT on 09/08/13 noted.  BUN/Cr 39/1.49.  K 6.0.  H/H 9.4/30.9.  (Most recent hgb has been ~ 10 in 01/2013.) Labs were already called to Dr. Randel Pigg office and she was sent to the [facility] for lab re-evaluation.  K then was 5.2, Cr 1.38, H/H 8.9/29.4.  Reviewed above with anesthesiologist Dr. Linna Caprice.  Patient with normal stress test within the past 2 1/2 years, last EKG showed no significant ST/T abnormalities, and she denied CV symptoms at PAT.  She has been followed regularly by her PCP with CAD/CHF noted to be stable in 06/2013.  If no acute changes, no new CV/CHF symptoms, and follow-up labs acceptable then it is anticipated that she can proceed as planned.  She will get an ISTAT4 on arrival to re-evaluate H/H, K+.  Decision for T&S can be made at that time based on results.    [facility] Short Stay Center/Anesthesiology Phone [phone] 09/09/2013 11:34 AM

## 2013-09-13 MED ORDER — CEFAZOLIN SODIUM-DEXTROSE 2-3 GM-% IV SOLR
2.0000 g | INTRAVENOUS | Status: AC
  Administered 2013-09-14: 2 g via INTRAVENOUS

## 2013-09-13 NOTE — H&P (Signed)
[name] is an 80 y.o. female.   Chief Complaint: Right knee pain HPI: [name] is an 80 year old female who underwent right total knee revision 2 years ago. She did very well with that. She had a fall in December and had significant knee pain. Plain radiographs were negative. She has had continued symptoms and notes some deformity around the patella. At the time of her revision she had the patellar button removed but she had a shallow patella left. Subsequent CT scan shows displaced comminuted fracture of the patella. She reports continued pain and wants to undergo operative treatment of this knee despite the inherent risk which are discussed fully with her.  Past Medical History  Diagnosis Date  . ANEMIA 06/30/2007  . COLONIC POLYPS, HX OF 01/21/2007  . COLONIC POLYPS, HX OF 01/21/2007  . DEGENERATIVE JOINT DISEASE 05/19/2007  . DEPRESSION 02/07/2010  . DIASTOLIC HEART FAILURE, CHRONIC 02/27/2009  . DIVERTICULOSIS, COLON 01/21/2007  . DYSPNEA 11/13/2009  . Edema 11/13/2009  . GERD 01/21/2007  . HIATAL HERNIA WITH REFLUX 01/21/2007  . HYPERTENSION 01/21/2007  . LIMB PAIN 11/13/2009  . NECK PAIN, CHRONIC 02/13/2010  . Pure hypercholesterolemia 04/05/2008  . PONV (postoperative nausea and vomiting)     Pt had problems with memory after procedure  . Colitis     Hx: of  . Pneumonia   . Bronchitis     Hx: of  . FHx: allergies     Hx;of  . Heart palpitations     Hx: of  . Headache(784.0)   . CORONARY ARTERY DISEASE 04/05/2008    BMS to RCA 10/2007    Past Surgical History  Procedure Laterality Date  . Appendectomy    . Cholecystectomy    . Abdominal hysterectomy    . Lumbar laminectomy    . Total knee arthroplasty    . Rotator cuff repair    . Cardiac stent    . Neck surgery    . Cataract extraction w/ intraocular lens  implant, bilateral      Hx: of  . Cardiac catheterization      2009  . Tonsillectomy    . Dilation and curettage of uterus      Family History  Problem Relation Age of  Onset  . Osteoarthritis Mother   . Heart failure Mother    Social History:  reports that she quit smoking about 46 years ago. She has never used smokeless tobacco. She reports that she does not drink alcohol or use illicit drugs.  Allergies: No Known Allergies  No prescriptions prior to admission    No results found for this or any previous visit (from the past 48 hour(s)). No results found.  Review of Systems  Constitutional: Negative.   HENT: Negative.   Eyes: Negative.   Respiratory: Negative.   Cardiovascular: Negative.   Gastrointestinal: Negative.   Genitourinary: Negative.   Musculoskeletal: Positive for joint pain.  Skin: Negative.   Neurological: Negative.   Endo/Heme/Allergies: Negative.   Psychiatric/Behavioral: Negative.     There were no vitals taken for this visit. Physical Exam  Constitutional: She appears well-developed.  HENT:  Head: Normocephalic.  Eyes: Pupils are equal, round, and reactive to light.  Neck: Normal range of motion.  Cardiovascular: Normal rate.   Respiratory: Effort normal.  Neurological: She is alert.  Skin: Skin is warm.  Psychiatric: She has a normal mood and affect.   examination the right knee demonstrates some weakness with knee extension she does have bony ridging  palpable around the location of the patella collaterals are stable pedal pulses are intact range of motion of the knee is otherwise unchanged but painful. CT scan also shows comminuted patella fracture  Assessment/Plan Impression is comminuted patella fracture on the right-hand side in a revised total knee replacement. There is no swelling or effusion or indications that there is any infection present; however, the patient has had this for 6-8 weeks in the fragments are moving in are not palpable has ridging below the skin. Pain has been persistent and somewhat incapacitating for the patient. A long talk with [name] today about operative and nonoperative management of  this problem. Operative management would include a risk of infection to the knee replacement. Patient understands that risk but wishes to proceed with operative management. There could be some loss of range of motion with the palatectomy. A period of immobilization with the knee out straight is also going to be required. Patient understands risk and benefits and wished to proceed with surgery all questions answered.  DEAN,GREGORY SCOTT 09/13/2013, 10:44 AM

## 2013-09-13 NOTE — Progress Notes (Signed)
Notified patient of time change, instructed patient to arrive at 1300 pm on 09/14/13.

## 2013-09-14 ENCOUNTER — Inpatient Hospital Stay (HOSPITAL_COMMUNITY)
Admission: RE | Admit: 2013-09-14 | Discharge: 2013-09-20 | DRG: 488 | Disposition: A | Discharge status: Home Health | Payer: Medicare HMO | Source: Ambulatory Visit | Attending: Orthopedic Surgery | Admitting: Orthopedic Surgery

## 2013-09-14 ENCOUNTER — Inpatient Hospital Stay (HOSPITAL_COMMUNITY): Payer: Medicare HMO | Admitting: Anesthesiology

## 2013-09-14 ENCOUNTER — Encounter (HOSPITAL_COMMUNITY): Payer: Medicare HMO | Admitting: Vascular Surgery

## 2013-09-14 ENCOUNTER — Encounter (HOSPITAL_COMMUNITY)
Admission: RE | Disposition: A | Discharge status: Home Health | Payer: Self-pay | Source: Ambulatory Visit | Attending: Orthopedic Surgery

## 2013-09-14 DIAGNOSIS — I5032 Chronic diastolic (congestive) heart failure: Secondary | ICD-10-CM

## 2013-09-14 DIAGNOSIS — I5033 Acute on chronic diastolic (congestive) heart failure: Secondary | ICD-10-CM | POA: Diagnosis present

## 2013-09-14 DIAGNOSIS — M1990 Unspecified osteoarthritis, unspecified site: Secondary | ICD-10-CM | POA: Diagnosis present

## 2013-09-14 DIAGNOSIS — W19XXXA Unspecified fall, initial encounter: Secondary | ICD-10-CM | POA: Diagnosis present

## 2013-09-14 DIAGNOSIS — R0902 Hypoxemia: Secondary | ICD-10-CM | POA: Diagnosis present

## 2013-09-14 DIAGNOSIS — M542 Cervicalgia: Secondary | ICD-10-CM | POA: Diagnosis present

## 2013-09-14 DIAGNOSIS — N390 Urinary tract infection, site not specified: Secondary | ICD-10-CM | POA: Diagnosis not present

## 2013-09-14 DIAGNOSIS — B965 Pseudomonas (aeruginosa) (mallei) (pseudomallei) as the cause of diseases classified elsewhere: Secondary | ICD-10-CM | POA: Diagnosis present

## 2013-09-14 DIAGNOSIS — K219 Gastro-esophageal reflux disease without esophagitis: Secondary | ICD-10-CM | POA: Diagnosis present

## 2013-09-14 DIAGNOSIS — I2510 Atherosclerotic heart disease of native coronary artery without angina pectoris: Secondary | ICD-10-CM | POA: Diagnosis present

## 2013-09-14 DIAGNOSIS — S82009A Unspecified fracture of unspecified patella, initial encounter for closed fracture: Principal | ICD-10-CM | POA: Diagnosis present

## 2013-09-14 DIAGNOSIS — I10 Essential (primary) hypertension: Secondary | ICD-10-CM | POA: Diagnosis present

## 2013-09-14 DIAGNOSIS — Z8249 Family history of ischemic heart disease and other diseases of the circulatory system: Secondary | ICD-10-CM

## 2013-09-14 DIAGNOSIS — E861 Hypovolemia: Secondary | ICD-10-CM | POA: Diagnosis present

## 2013-09-14 DIAGNOSIS — E7800 Pure hypercholesterolemia, unspecified: Secondary | ICD-10-CM | POA: Diagnosis present

## 2013-09-14 DIAGNOSIS — E875 Hyperkalemia: Secondary | ICD-10-CM | POA: Diagnosis not present

## 2013-09-14 DIAGNOSIS — I959 Hypotension, unspecified: Secondary | ICD-10-CM | POA: Diagnosis not present

## 2013-09-14 DIAGNOSIS — R0602 Shortness of breath: Secondary | ICD-10-CM

## 2013-09-14 DIAGNOSIS — E860 Dehydration: Secondary | ICD-10-CM | POA: Diagnosis present

## 2013-09-14 DIAGNOSIS — F32A Depression, unspecified: Secondary | ICD-10-CM | POA: Diagnosis present

## 2013-09-14 DIAGNOSIS — R5383 Other fatigue: Secondary | ICD-10-CM | POA: Diagnosis not present

## 2013-09-14 DIAGNOSIS — Z87891 Personal history of nicotine dependence: Secondary | ICD-10-CM

## 2013-09-14 DIAGNOSIS — I509 Heart failure, unspecified: Secondary | ICD-10-CM | POA: Diagnosis present

## 2013-09-14 DIAGNOSIS — F329 Major depressive disorder, single episode, unspecified: Secondary | ICD-10-CM | POA: Diagnosis present

## 2013-09-14 DIAGNOSIS — F3289 Other specified depressive episodes: Secondary | ICD-10-CM | POA: Diagnosis present

## 2013-09-14 DIAGNOSIS — Z96659 Presence of unspecified artificial knee joint: Secondary | ICD-10-CM

## 2013-09-14 DIAGNOSIS — D649 Anemia, unspecified: Secondary | ICD-10-CM

## 2013-09-14 HISTORY — PX: PATELLECTOMY: SHX1022

## 2013-09-14 LAB — POCT I-STAT 4, (NA,K, GLUC, HGB,HCT)
Glucose, Bld: 99 mg/dL (ref 70–99)
HCT: 29 % — ABNORMAL LOW (ref 36.0–46.0)
Hemoglobin: 9.9 g/dL — ABNORMAL LOW (ref 12.0–15.0)
Potassium: 5 mEq/L (ref 3.7–5.3)
Sodium: 143 mEq/L (ref 137–147)

## 2013-09-14 SURGERY — PATELLECTOMY
Anesthesia: General | Site: Knee | Laterality: Right

## 2013-09-14 MED ORDER — HYDROMORPHONE HCL PF 1 MG/ML IJ SOLN
INTRAMUSCULAR | Status: AC
Start: 2013-09-14 — End: 2013-09-15
  Filled 2013-09-14: qty 1

## 2013-09-14 MED ORDER — MORPHINE SULFATE 4 MG/ML IJ SOLN
INTRAMUSCULAR | Status: DC | PRN
  Administered 2013-09-14: 8 mg

## 2013-09-14 MED ORDER — MENTHOL 3 MG MT LOZG: 1.0000 | LOZENGE | OROMUCOSAL | Status: DC | PRN

## 2013-09-14 MED ORDER — LORAZEPAM 0.5 MG PO TABS: 0.5000 mg | ORAL_TABLET | Freq: Three times a day (TID) | ORAL | Status: DC | PRN

## 2013-09-14 MED ORDER — SERTRALINE HCL 50 MG PO TABS
50.0000 mg | ORAL_TABLET | Freq: Every day | ORAL | Status: DC
  Administered 2013-09-15 – 2013-09-20 (×6): 50 mg via ORAL
  Filled 2013-09-14 (×6): qty 1

## 2013-09-14 MED ORDER — SPIRONOLACTONE 25 MG PO TABS
25.0000 mg | ORAL_TABLET | Freq: Every day | ORAL | Status: DC
Start: 2013-09-15 — End: 2013-09-15
  Administered 2013-09-15: 25 mg via ORAL
  Filled 2013-09-14: qty 1

## 2013-09-14 MED ORDER — CHLORHEXIDINE GLUCONATE 4 % EX LIQD
60.0000 mL | Freq: Once | CUTANEOUS | Status: DC
  Filled 2013-09-14: qty 60

## 2013-09-14 MED ORDER — ONDANSETRON HCL 4 MG/2ML IJ SOLN: 4.0000 mg | Freq: Four times a day (QID) | INTRAMUSCULAR | Status: DC | PRN

## 2013-09-14 MED ORDER — FENTANYL CITRATE 0.05 MG/ML IJ SOLN
INTRAMUSCULAR | Status: DC | PRN
  Administered 2013-09-14: 100 ug via INTRAVENOUS
  Administered 2013-09-14: 50 ug via INTRAVENOUS

## 2013-09-14 MED ORDER — BENAZEPRIL HCL 40 MG PO TABS
40.0000 mg | ORAL_TABLET | Freq: Every day | ORAL | Status: DC
  Administered 2013-09-15: 40 mg via ORAL
  Filled 2013-09-14: qty 1

## 2013-09-14 MED ORDER — CEFAZOLIN SODIUM-DEXTROSE 2-3 GM-% IV SOLR
INTRAVENOUS | Status: AC
  Filled 2013-09-14: qty 50

## 2013-09-14 MED ORDER — HYDROCODONE-ACETAMINOPHEN 10-325 MG PO TABS
1.0000 | ORAL_TABLET | ORAL | Status: DC | PRN
Start: 2013-09-14 — End: 2013-09-20
  Administered 2013-09-14 – 2013-09-15 (×2): 1 via ORAL
  Administered 2013-09-15 (×2): 2 via ORAL
  Administered 2013-09-16 – 2013-09-18 (×6): 1 via ORAL
  Administered 2013-09-19: 2 via ORAL
  Administered 2013-09-19: 1 via ORAL
  Administered 2013-09-20 (×2): 2 via ORAL
  Filled 2013-09-14 (×5): qty 1
  Filled 2013-09-14: qty 2
  Filled 2013-09-14: qty 1
  Filled 2013-09-14: qty 2
  Filled 2013-09-14: qty 1
  Filled 2013-09-14 (×2): qty 2
  Filled 2013-09-14: qty 1
  Filled 2013-09-14: qty 2
  Filled 2013-09-14: qty 1

## 2013-09-14 MED ORDER — HYDROMORPHONE HCL PF 1 MG/ML IJ SOLN
0.2500 mg | INTRAMUSCULAR | Status: DC | PRN
  Administered 2013-09-14 (×3): 0.5 mg via INTRAVENOUS

## 2013-09-14 MED ORDER — PROMETHAZINE HCL 25 MG/ML IJ SOLN
INTRAMUSCULAR | Status: AC
  Filled 2013-09-14: qty 1

## 2013-09-14 MED ORDER — FERROUS SULFATE 325 (65 FE) MG PO TABS
325.0000 mg | ORAL_TABLET | Freq: Every day | ORAL | Status: DC
  Administered 2013-09-15 – 2013-09-20 (×6): 325 mg via ORAL
  Filled 2013-09-14 (×7): qty 1

## 2013-09-14 MED ORDER — NITROGLYCERIN 0.4 MG SL SUBL: 0.4000 mg | SUBLINGUAL_TABLET | SUBLINGUAL | Status: DC | PRN

## 2013-09-14 MED ORDER — ACETAMINOPHEN 650 MG RE SUPP: 650.0000 mg | Freq: Four times a day (QID) | RECTAL | Status: DC | PRN

## 2013-09-14 MED ORDER — OXYCODONE HCL 5 MG PO TABS
5.0000 mg | ORAL_TABLET | Freq: Once | ORAL | Status: AC | PRN
  Administered 2013-09-14: 5 mg via ORAL

## 2013-09-14 MED ORDER — CEFAZOLIN SODIUM-DEXTROSE 2-3 GM-% IV SOLR
2.0000 g | Freq: Three times a day (TID) | INTRAVENOUS | Status: AC
  Administered 2013-09-14 – 2013-09-15 (×2): 2 g via INTRAVENOUS
  Filled 2013-09-14 (×2): qty 50

## 2013-09-14 MED ORDER — PROPOFOL 10 MG/ML IV BOLUS
INTRAVENOUS | Status: DC | PRN
  Administered 2013-09-14: 175 mg via INTRAVENOUS
  Administered 2013-09-14: 25 mg via INTRAVENOUS

## 2013-09-14 MED ORDER — OXYCODONE HCL 5 MG/5ML PO SOLN: 5.0000 mg | Freq: Once | ORAL | Status: AC | PRN

## 2013-09-14 MED ORDER — METOPROLOL TARTRATE 50 MG PO TABS
50.0000 mg | ORAL_TABLET | Freq: Two times a day (BID) | ORAL | Status: DC
  Administered 2013-09-14 – 2013-09-15 (×2): 50 mg via ORAL
  Filled 2013-09-14 (×3): qty 1

## 2013-09-14 MED ORDER — ONDANSETRON HCL 4 MG/2ML IJ SOLN
INTRAMUSCULAR | Status: AC
  Filled 2013-09-14: qty 2

## 2013-09-14 MED ORDER — SODIUM CHLORIDE 0.9 % IV SOLN
INTRAVENOUS | Status: AC
  Administered 2013-09-14 – 2013-09-15 (×2): via INTRAVENOUS

## 2013-09-14 MED ORDER — FUROSEMIDE 20 MG PO TABS
20.0000 mg | ORAL_TABLET | Freq: Every day | ORAL | Status: DC | PRN
  Filled 2013-09-14: qty 1

## 2013-09-14 MED ORDER — PROPOFOL 10 MG/ML IV BOLUS
INTRAVENOUS | Status: AC
  Filled 2013-09-14: qty 20

## 2013-09-14 MED ORDER — ACETAMINOPHEN 325 MG PO TABS
650.0000 mg | ORAL_TABLET | Freq: Four times a day (QID) | ORAL | Status: DC | PRN
  Administered 2013-09-15 – 2013-09-16 (×3): 650 mg via ORAL
  Filled 2013-09-14 (×3): qty 2

## 2013-09-14 MED ORDER — MORPHINE SULFATE 4 MG/ML IJ SOLN
INTRAMUSCULAR | Status: AC
  Filled 2013-09-14: qty 2

## 2013-09-14 MED ORDER — PROMETHAZINE HCL 25 MG/ML IJ SOLN
6.2500 mg | INTRAMUSCULAR | Status: DC | PRN
  Administered 2013-09-14: 6.25 mg via INTRAVENOUS

## 2013-09-14 MED ORDER — ASPIRIN EC 325 MG PO TBEC
325.0000 mg | DELAYED_RELEASE_TABLET | Freq: Every day | ORAL | Status: DC
  Administered 2013-09-15 – 2013-09-20 (×6): 325 mg via ORAL
  Filled 2013-09-14 (×7): qty 1

## 2013-09-14 MED ORDER — SIMVASTATIN 40 MG PO TABS
40.0000 mg | ORAL_TABLET | Freq: Every day | ORAL | Status: DC
  Administered 2013-09-14 – 2013-09-19 (×6): 40 mg via ORAL
  Filled 2013-09-14 (×7): qty 1

## 2013-09-14 MED ORDER — OXYCODONE HCL 5 MG PO TABS
ORAL_TABLET | ORAL | Status: AC
Start: 2013-09-14 — End: 2013-09-15
  Filled 2013-09-14: qty 1

## 2013-09-14 MED ORDER — TRAZODONE HCL 50 MG PO TABS
50.0000 mg | ORAL_TABLET | Freq: Every day | ORAL | Status: DC
  Administered 2013-09-14 – 2013-09-19 (×6): 50 mg via ORAL
  Filled 2013-09-14 (×7): qty 1

## 2013-09-14 MED ORDER — BACLOFEN 10 MG PO TABS
10.0000 mg | ORAL_TABLET | Freq: Three times a day (TID) | ORAL | Status: DC
  Administered 2013-09-14 – 2013-09-20 (×15): 10 mg via ORAL
  Filled 2013-09-14 (×19): qty 1

## 2013-09-14 MED ORDER — DOCUSATE SODIUM 100 MG PO CAPS
100.0000 mg | ORAL_CAPSULE | Freq: Two times a day (BID) | ORAL | Status: DC
  Administered 2013-09-14 – 2013-09-20 (×12): 100 mg via ORAL
  Filled 2013-09-14 (×13): qty 1

## 2013-09-14 MED ORDER — FENTANYL CITRATE 0.05 MG/ML IJ SOLN
INTRAMUSCULAR | Status: AC
  Filled 2013-09-14: qty 5

## 2013-09-14 MED ORDER — LACTATED RINGERS IV SOLN
INTRAVENOUS | Status: DC
  Administered 2013-09-14: 14:00:00 via INTRAVENOUS

## 2013-09-14 MED ORDER — ONDANSETRON HCL 4 MG PO TABS: 4.0000 mg | ORAL_TABLET | Freq: Four times a day (QID) | ORAL | Status: DC | PRN

## 2013-09-14 MED ORDER — METOCLOPRAMIDE HCL 10 MG PO TABS
5.0000 mg | ORAL_TABLET | Freq: Three times a day (TID) | ORAL | Status: DC | PRN
  Administered 2013-09-15: 10 mg via ORAL
  Filled 2013-09-14: qty 1

## 2013-09-14 MED ORDER — PANTOPRAZOLE SODIUM 40 MG PO TBEC
40.0000 mg | DELAYED_RELEASE_TABLET | Freq: Every day | ORAL | Status: DC
  Administered 2013-09-15 – 2013-09-20 (×6): 40 mg via ORAL
  Filled 2013-09-14 (×6): qty 1

## 2013-09-14 MED ORDER — BUPIVACAINE HCL (PF) 0.25 % IJ SOLN
INTRAMUSCULAR | Status: DC | PRN
  Administered 2013-09-14: 30 mL

## 2013-09-14 MED ORDER — TRAMADOL HCL 50 MG PO TABS
50.0000 mg | ORAL_TABLET | Freq: Four times a day (QID) | ORAL | Status: DC | PRN
  Administered 2013-09-15 – 2013-09-19 (×10): 50 mg via ORAL
  Filled 2013-09-14 (×10): qty 1

## 2013-09-14 MED ORDER — HYDROMORPHONE HCL PF 1 MG/ML IJ SOLN
INTRAMUSCULAR | Status: AC
  Filled 2013-09-14: qty 1

## 2013-09-14 MED ORDER — METOCLOPRAMIDE HCL 5 MG/ML IJ SOLN: 5.0000 mg | Freq: Three times a day (TID) | INTRAMUSCULAR | Status: DC | PRN

## 2013-09-14 MED ORDER — LIDOCAINE HCL (CARDIAC) 20 MG/ML IV SOLN
INTRAVENOUS | Status: DC | PRN
  Administered 2013-09-14: 100 mg via INTRAVENOUS

## 2013-09-14 MED ORDER — PHENOL 1.4 % MT LIQD: 1.0000 | OROMUCOSAL | Status: DC | PRN

## 2013-09-14 MED ORDER — CLONIDINE HCL (ANALGESIA) 100 MCG/ML EP SOLN
EPIDURAL | Status: DC | PRN
  Administered 2013-09-14: 1 mL

## 2013-09-14 MED ORDER — GABAPENTIN 600 MG PO TABS
600.0000 mg | ORAL_TABLET | Freq: Three times a day (TID) | ORAL | Status: DC
  Administered 2013-09-14 – 2013-09-19 (×15): 600 mg via ORAL
  Filled 2013-09-14 (×19): qty 1

## 2013-09-14 MED ORDER — LIDOCAINE HCL (CARDIAC) 20 MG/ML IV SOLN
INTRAVENOUS | Status: AC
  Filled 2013-09-14: qty 5

## 2013-09-14 MED ORDER — BUPIVACAINE HCL (PF) 0.25 % IJ SOLN
INTRAMUSCULAR | Status: AC
  Filled 2013-09-14: qty 30

## 2013-09-14 SURGICAL SUPPLY — 54 items
BANDAGE ELASTIC 4 VELCRO ST LF (GAUZE/BANDAGES/DRESSINGS) ×2 IMPLANT
BANDAGE ELASTIC 6 VELCRO ST LF (GAUZE/BANDAGES/DRESSINGS) ×2 IMPLANT
BANDAGE ESMARK 6X9 LF (GAUZE/BANDAGES/DRESSINGS) ×1 IMPLANT
BANDAGE GAUZE ELAST BULKY 4 IN (GAUZE/BANDAGES/DRESSINGS) ×2 IMPLANT
BIT DRILL 7/64X5 DISP (BIT) ×2 IMPLANT
BNDG COHESIVE 4X5 TAN STRL (GAUZE/BANDAGES/DRESSINGS) ×2 IMPLANT
BNDG ESMARK 6X9 LF (GAUZE/BANDAGES/DRESSINGS) ×2
COVER MAYO STAND STRL (DRAPES) ×2 IMPLANT
CUFF TOURNIQUET SINGLE 34IN LL (TOURNIQUET CUFF) IMPLANT
CUFF TOURNIQUET SINGLE 44IN (TOURNIQUET CUFF) IMPLANT
DRAPE U-SHAPE 47X51 STRL (DRAPES) ×2 IMPLANT
DRSG ADAPTIC 3X8 NADH LF (GAUZE/BANDAGES/DRESSINGS) ×2 IMPLANT
DRSG PAD ABDOMINAL 8X10 ST (GAUZE/BANDAGES/DRESSINGS) ×2 IMPLANT
DURAPREP 26ML APPLICATOR (WOUND CARE) ×2 IMPLANT
ELECT REM PT RETURN 9FT ADLT (ELECTROSURGICAL) ×2
ELECTRODE REM PT RTRN 9FT ADLT (ELECTROSURGICAL) ×1 IMPLANT
GAUZE XEROFORM 5X9 LF (GAUZE/BANDAGES/DRESSINGS) ×2 IMPLANT
GLOVE BIOGEL PI IND STRL 8 (GLOVE) ×1 IMPLANT
GLOVE BIOGEL PI INDICATOR 8 (GLOVE) ×1
GLOVE SURG ORTHO 8.0 STRL STRW (GLOVE) ×2 IMPLANT
GOWN EXTRA PROTECTION XXL 0583 (GOWNS) ×2 IMPLANT
GOWN PREVENTION PLUS LG XLONG (DISPOSABLE) IMPLANT
GOWN STRL NON-REIN LRG LVL3 (GOWN DISPOSABLE) ×4 IMPLANT
HANDPIECE INTERPULSE COAX TIP (DISPOSABLE) ×1
IMMOBILIZER KNEE 22 UNIV (SOFTGOODS) IMPLANT
KIT BASIN OR (CUSTOM PROCEDURE TRAY) ×2 IMPLANT
KIT ROOM TURNOVER OR (KITS) ×2 IMPLANT
MANIFOLD NEPTUNE II (INSTRUMENTS) ×2 IMPLANT
NEEDLE MAYO TROCAR (NEEDLE) ×2 IMPLANT
NS IRRIG 1000ML POUR BTL (IV SOLUTION) ×2 IMPLANT
PACK ORTHO EXTREMITY (CUSTOM PROCEDURE TRAY) ×2 IMPLANT
PAD ARMBOARD 7.5X6 YLW CONV (MISCELLANEOUS) ×4 IMPLANT
PAD CAST 4YDX4 CTTN HI CHSV (CAST SUPPLIES) ×1 IMPLANT
PADDING CAST COTTON 4X4 STRL (CAST SUPPLIES) ×1
PADDING CAST COTTON 6X4 STRL (CAST SUPPLIES) ×2 IMPLANT
PASSER SUT SWANSON 36MM LOOP (INSTRUMENTS) ×2 IMPLANT
SET HNDPC FAN SPRY TIP SCT (DISPOSABLE) ×1 IMPLANT
SPONGE GAUZE 4X4 12PLY (GAUZE/BANDAGES/DRESSINGS) ×2 IMPLANT
SPONGE GAUZE 4X4 12PLY STER LF (GAUZE/BANDAGES/DRESSINGS) ×2 IMPLANT
SPONGE LAP 18X18 X RAY DECT (DISPOSABLE) ×2 IMPLANT
STAPLER VISISTAT 35W (STAPLE) ×2 IMPLANT
STOCKINETTE IMPERVIOUS 9X36 MD (GAUZE/BANDAGES/DRESSINGS) ×2 IMPLANT
SUT FIBERWIRE #2 38 T-5 BLUE (SUTURE) ×2
SUT VIC AB 0 CT1 27 (SUTURE) ×1
SUT VIC AB 0 CT1 27XBRD ANBCTR (SUTURE) ×1 IMPLANT
SUT VIC AB 2-0 CT1 27 (SUTURE) ×1
SUT VIC AB 2-0 CT1 TAPERPNT 27 (SUTURE) ×1 IMPLANT
SUTURE FIBERWR #2 38 T-5 BLUE (SUTURE) ×1 IMPLANT
TOWEL OR 17X24 6PK STRL BLUE (TOWEL DISPOSABLE) ×2 IMPLANT
TOWEL OR 17X26 10 PK STRL BLUE (TOWEL DISPOSABLE) ×2 IMPLANT
TUBE CONNECTING 12X1/4 (SUCTIONS) ×2 IMPLANT
UNDERPAD 30X30 INCONTINENT (UNDERPADS AND DIAPERS) ×2 IMPLANT
WATER STERILE IRR 1000ML POUR (IV SOLUTION) ×2 IMPLANT
YANKAUER SUCT BULB TIP NO VENT (SUCTIONS) ×2 IMPLANT

## 2013-09-14 NOTE — Transfer of Care (Signed)
Immediate Anesthesia Transfer of Care Note  Patient: [name]  Procedure(s) Performed: Procedure(s): PATELLECTOMY (Right)  Patient Location: PACU  Anesthesia Type:General  Level of Consciousness: awake, alert  and oriented  Airway & Oxygen Therapy: Patient Spontanous Breathing and Patient connected to nasal cannula oxygen  Post-op Assessment: Report given to PACU RN, Post -op Vital signs reviewed and stable and Patient moving all extremities  Post vital signs: Reviewed and stable  Complications: No apparent anesthesia complications

## 2013-09-14 NOTE — Interval H&P Note (Signed)
History and Physical Interval Note:  09/14/2013 7:20 AM  [name]  has presented today for surgery, with the diagnosis of RIGHT PATELLA FRACTURE  The various methods of treatment have been discussed with the patient and family. After consideration of risks, benefits and other options for treatment, the patient has consented to  Procedure(s): PATELLECTOMY (Right) as a surgical intervention .  The patient's history has been reviewed, patient examined, no change in status, stable for surgery.  I have reviewed the patient's chart and labs.  Questions were answered to the patient's satisfaction.     DEAN,GREGORY SCOTT

## 2013-09-14 NOTE — Anesthesia Preprocedure Evaluation (Signed)
Anesthesia Evaluation    History of Anesthesia Complications (+) PONV and history of anesthetic complications  Airway       Dental   Pulmonary former smoker,          Cardiovascular hypertension, + CAD and + Cardiac Stents     Neuro/Psych PSYCHIATRIC DISORDERS Depression    GI/Hepatic Neg liver ROS,   Endo/Other  negative endocrine ROS  Renal/GU negative Renal ROS     Musculoskeletal   Abdominal   Peds  Hematology   Anesthesia Other Findings   Reproductive/Obstetrics                           Anesthesia Physical Anesthesia Plan  ASA: III  Anesthesia Plan: General   Post-op Pain Management:    Induction: Intravenous  Airway Management Planned: LMA  Additional Equipment:   Intra-op Plan:   Post-operative Plan: Extubation in OR  Informed Consent:   Plan Discussed with: CRNA, Anesthesiologist and Surgeon  Anesthesia Plan Comments:         Anesthesia Quick Evaluation

## 2013-09-14 NOTE — Anesthesia Procedure Notes (Signed)
Procedure Name: LMA Insertion Date/Time: 09/14/2013 5:24 PM Performed by: Melina Copa, Wava Kildow R Pre-anesthesia Checklist: Patient identified, Emergency Drugs available, Suction available, Patient being monitored and Timeout performed Patient Re-evaluated:Patient Re-evaluated prior to inductionOxygen Delivery Method: Circle system utilized Preoxygenation: Pre-oxygenation with 100% oxygen Intubation Type: IV induction Ventilation: Mask ventilation without difficulty LMA: LMA inserted LMA Size: 4.0 Number of attempts: 1 Placement Confirmation: positive ETCO2 and breath sounds checked- equal and bilateral Tube secured with: Tape Dental Injury: Teeth and Oropharynx as per pre-operative assessment

## 2013-09-14 NOTE — Anesthesia Postprocedure Evaluation (Signed)
  Anesthesia Post-op Note  Patient: [name]  Procedure(s) Performed: Procedure(s): PATELLECTOMY (Right)  Patient Location: PACU  Anesthesia Type:General  Level of Consciousness: awake, alert , oriented and patient cooperative  Airway and Oxygen Therapy: Patient Spontanous Breathing and Patient connected to nasal cannula oxygen  Post-op Pain: none  Post-op Assessment: Post-op Vital signs reviewed, Patient's Cardiovascular Status Stable, Respiratory Function Stable, Patent Airway and Pain level controlled, nausea improved  Post-op Vital Signs: Reviewed and stable  Complications: No apparent anesthesia complications

## 2013-09-14 NOTE — Brief Op Note (Signed)
09/14/2013  6:57 PM  PATIENT:  [name]  80 y.o. female  PRE-OPERATIVE DIAGNOSIS:  RIGHT PATELLA FRACTURE  POST-OPERATIVE DIAGNOSIS:  RIGHT PATELLA FRACTURE  PROCEDURE:  Procedure(s): PATELLECTOMY  SURGEON:  Surgeon(s): Meredith Pel, MD  ASSISTANT: none  ANESTHESIA:   general  EBL: 50 ml       BLOOD ADMINISTERED: none  DRAINS: none   LOCAL MEDICATIONS USED:  Marcaine morphine clonidine SPECIMEN:  No Specimen  COUNTS:  YES  TOURNIQUET:   Total Tourniquet Time Documented: Thigh (Right) - 44 minutes Total: Thigh (Right) - 44 minutes   DICTATION: .Other Dictation: Dictation Number [id number]  PLAN OF CARE: Admit to inpatient   PATIENT DISPOSITION:  PACU - hemodynamically stable

## 2013-09-14 NOTE — Progress Notes (Signed)
Care of pt assumed by MA Mumtaz Lovins RN 

## 2013-09-15 ENCOUNTER — Encounter (HOSPITAL_COMMUNITY): Payer: Self-pay | Admitting: General Practice

## 2013-09-15 ENCOUNTER — Inpatient Hospital Stay (HOSPITAL_COMMUNITY): Payer: Medicare HMO

## 2013-09-15 DIAGNOSIS — R0902 Hypoxemia: Secondary | ICD-10-CM

## 2013-09-15 DIAGNOSIS — R5383 Other fatigue: Secondary | ICD-10-CM | POA: Diagnosis not present

## 2013-09-15 DIAGNOSIS — R5381 Other malaise: Secondary | ICD-10-CM

## 2013-09-15 DIAGNOSIS — E860 Dehydration: Secondary | ICD-10-CM

## 2013-09-15 DIAGNOSIS — I959 Hypotension, unspecified: Secondary | ICD-10-CM

## 2013-09-15 LAB — PRO B NATRIURETIC PEPTIDE: Pro B Natriuretic peptide (BNP): 1858 pg/mL — ABNORMAL HIGH (ref 0–450)

## 2013-09-15 LAB — TROPONIN I
Troponin I: <0.3 ng/mL (ref <0.30)
Troponin I: <0.3 ng/mL (ref <0.30)

## 2013-09-15 LAB — COMPREHENSIVE METABOLIC PANEL
ALT: 48 U/L — ABNORMAL HIGH (ref 0–35)
AST: 124 U/L — ABNORMAL HIGH (ref 0–37)
Albumin: 3.1 g/dL — ABNORMAL LOW (ref 3.5–5.2)
Alkaline Phosphatase: 107 U/L (ref 39–117)
BUN: 29 mg/dL — ABNORMAL HIGH (ref 6–23)
CO2: 20 mEq/L (ref 19–32)
Calcium: 8.4 mg/dL (ref 8.4–10.5)
Chloride: 101 mEq/L (ref 96–112)
Creatinine, Ser: 1.19 mg/dL — ABNORMAL HIGH (ref 0.50–1.10)
GFR calc Af Amer: 48 mL/min — ABNORMAL LOW (ref >90)
GFR calc non Af Amer: 42 mL/min — ABNORMAL LOW (ref >90)
Glucose, Bld: 103 mg/dL — ABNORMAL HIGH (ref 70–99)
Potassium: 4.7 mEq/L (ref 3.7–5.3)
Sodium: 137 mEq/L (ref 137–147)
Total Bilirubin: 0.3 mg/dL (ref 0.3–1.2)
Total Protein: 6.5 g/dL (ref 6.0–8.3)

## 2013-09-15 LAB — CBC
HCT: 26.5 % — ABNORMAL LOW (ref 36.0–46.0)
Hemoglobin: 7.9 g/dL — ABNORMAL LOW (ref 12.0–15.0)
MCH: 23.9 pg — ABNORMAL LOW (ref 26.0–34.0)
MCHC: 29.8 g/dL — ABNORMAL LOW (ref 30.0–36.0)
MCV: 80.3 fL (ref 78.0–100.0)
Platelets: 154 10*3/uL (ref 150–400)
RBC: 3.3 MIL/uL — ABNORMAL LOW (ref 3.87–5.11)
RDW: 15.9 % — ABNORMAL HIGH (ref 11.5–15.5)
WBC: 8.6 10*3/uL (ref 4.0–10.5)

## 2013-09-15 LAB — D-DIMER, QUANTITATIVE: D-Dimer, Quant: 2.36 ug/mL-FEU — ABNORMAL HIGH (ref 0.00–0.48)

## 2013-09-15 LAB — MAGNESIUM: Magnesium: 2 mg/dL (ref 1.5–2.5)

## 2013-09-15 LAB — GLUCOSE, CAPILLARY: Glucose-Capillary: 88 mg/dL (ref 70–99)

## 2013-09-15 MED ORDER — SODIUM CHLORIDE 0.9 % IV BOLUS (SEPSIS): 500.0000 mL | Freq: Once | INTRAVENOUS | Status: DC

## 2013-09-15 MED ORDER — SODIUM CHLORIDE 0.9 % IV SOLN
INTRAVENOUS | Status: DC
  Administered 2013-09-15 – 2013-09-16 (×2): via INTRAVENOUS

## 2013-09-15 MED ORDER — SODIUM CHLORIDE 0.9 % IV BOLUS (SEPSIS)
250.0000 mL | Freq: Once | INTRAVENOUS | Status: AC
  Administered 2013-09-15: 250 mL via INTRAVENOUS

## 2013-09-15 MED ORDER — SODIUM CHLORIDE 0.9 % IV BOLUS (SEPSIS)
500.0000 mL | Freq: Once | INTRAVENOUS | Status: AC
  Administered 2013-09-15: 500 mL via INTRAVENOUS

## 2013-09-15 MED ORDER — IOHEXOL 350 MG/ML SOLN
80.0000 mL | Freq: Once | INTRAVENOUS | Status: AC | PRN
  Administered 2013-09-15: 80 mL via INTRAVENOUS

## 2013-09-15 NOTE — Evaluation (Signed)
Physical Therapy Evaluation Patient Details Name: [name] MRN: [id number] DOB: 1930/06/27 Today's Date: 09/15/2013 Time: 4098-1191 PT Time Calculation (min): 17 min  PT Assessment / Plan / Recommendation History of Present Illness  [name] is an 80 year old female who underwent right total knee revision 2 years ago. She did very well with that. She had a fall in December and had significant knee pain. Plain radiographs were negative. She has had continued symptoms and notes some deformity around the patella. At the time of her revision she had the patellar button removed but she had a shallow patella left. Subsequent CT scan shows displaced comminuted fracture of the patella. She reports continued pain and wants to undergo operative treatment of this knee despite the inherent risk which are discussed fully with her.  She underwent patellectomy.  Clinical Impression  Pt mobilized bed to chair with RW and min A. Recommend assist for OOB mobility once home which pt reports family can give. She will benefit from acute PT to increase safety and independence with mobility for d/c home. Recommend HHPT at d/c.    PT Assessment  Patient needs continued PT services    Follow Up Recommendations  Home health PT;Supervision for mobility/OOB    Does the patient have the potential to tolerate intense rehabilitation      Barriers to Discharge        Equipment Recommendations  None recommended by PT    Recommendations for Other Services     Frequency Min 5X/week    Precautions / Restrictions Precautions Precautions: Fall;Knee Required Braces or Orthoses: Knee Immobilizer - Right Knee Immobilizer - Right: On at all times Restrictions Weight Bearing Restrictions: Yes RLE Weight Bearing: Weight bearing as tolerated   Pertinent Vitals/Pain 8/10 R knee pain, repositioned      Mobility  Bed Mobility Overal bed mobility: Needs Assistance Bed Mobility: Sidelying to Sit Sidelying to sit: Min  assist General bed mobility comments: min A to support RLE while pt scooting to EOB, good use of UE's to scoot in bed Transfers Overall transfer level: Needs assistance Equipment used: Rolling walker (2 wheeled) Transfers: Sit to/from Stand Sit to Stand: Min assist General transfer comment: pt has difficulty due to LLE weakness. Assist to properly place left foot under her for maximum push into standing. One hand on bed and one on RW with min A for power up.  Ambulation/Gait Ambulation/Gait assistance: Min assist Ambulation Distance (Feet): 4 Feet Assistive device: Rolling walker (2 wheeled) Gait Pattern/deviations: Step-to pattern;Decreased step length - right;Decreased stance time - right Gait velocity: decreased Gait velocity interpretation: Below normal speed for age/gender General Gait Details: KI effectively keeping RLE in extension, no buckling noted within KI. Min A for safety     Exercises General Exercises - Lower Extremity Ankle Circles/Pumps: AROM;Both;10 reps;Seated   PT Diagnosis: Difficulty walking;Abnormality of gait;Acute pain;Generalized weakness  PT Problem List: Decreased strength;Decreased range of motion;Decreased activity tolerance;Decreased balance;Decreased knowledge of precautions;Pain PT Treatment Interventions: DME instruction;Gait training;Functional mobility training;Therapeutic activities;Therapeutic exercise;Balance training;Patient/family education     PT Goals(Current goals can be found in the care plan section) Acute Rehab PT Goals Patient Stated Goal: return home PT Goal Formulation: With patient Time For Goal Achievement: 09/22/13 Potential to Achieve Goals: Good  Visit Information  Last PT Received On: 09/15/13 Assistance Needed: +1 History of Present Illness: [name] is an 80 year old female who underwent right total knee revision 2 years ago. She did very well with that. She had a fall in December and  had significant knee pain. Plain  radiographs were negative. She has had continued symptoms and notes some deformity around the patella. At the time of her revision she had the patellar button removed but she had a shallow patella left. Subsequent CT scan shows displaced comminuted fracture of the patella. She reports continued pain and wants to undergo operative treatment of this knee despite the inherent risk which are discussed fully with her.  She underwent patellectomy.       Prior [place] expects to be discharged to:: Private residence Living Arrangements: Alone Available Help at Discharge: Family;Available 24 hours/day Type of Home: House Home Access: Ramped entrance Home Layout: One level Home Equipment: Walker - 2 wheels;Bedside commode Additional Comments: pt reports she has family that can stay with her 24/7 when she gets home if she needs it. She has had multiple knee and back surgeries Prior Function Level of Independence: Independent with assistive device(s) Comments: was using RW prior to surgery due to knee pain Communication Communication: No difficulties    Cognition  Cognition Arousal/Alertness: Awake/alert Behavior During Therapy: WFL for tasks assessed/performed Overall Cognitive Status: Within Functional Limits for tasks assessed    Extremity/Trunk Assessment Upper Extremity Assessment Upper Extremity Assessment: Overall WFL for tasks assessed Lower Extremity Assessment Lower Extremity Assessment: RLE deficits/detail;LLE deficits/detail RLE Deficits / Details: limited evaluation due to KI, hip flex 2+/5, ankle WFL RLE: Unable to fully assess due to immobilization LLE Deficits / Details: pt reports weakness of LLE and she usually wears a knee brace, knee grossly 3/5, hip flex 3+/5 Cervical / Trunk Assessment Cervical / Trunk Assessment: Normal   Balance Balance Overall balance assessment: Needs assistance Sitting-balance support: Feet supported;No upper  extremity supported Sitting balance-Leahy Scale: Good Standing balance support: Bilateral upper extremity supported;During functional activity Standing balance-Leahy Scale: Poor  End of Session PT - End of Session Equipment Utilized During Treatment: Gait belt;Right knee immobilizer Activity Tolerance: Patient tolerated treatment well Patient left: in chair;with call bell/phone within reach Nurse Communication: Mobility status  GP   Leighton Roach, PT  Acute Rehab Services  [place], [place] 09/15/2013, 12:14 PM

## 2013-09-15 NOTE — Progress Notes (Signed)
PT is recommending HH PT. Clinical Social Worker will sign off for now as social work intervention is no longer needed. Please consult [place] again if new need arises.   Rhea Pink, MSW, [place]

## 2013-09-15 NOTE — Op Note (Signed)
NAMEDAWT, REEB NO.:  [id number]  MEDICAL RECORD NO.:  [id number]  LOCATION:                                 FACILITY:  PHYSICIAN:  Anderson Malta, M.D.    DATE OF BIRTH:  1930/01/26  DATE OF PROCEDURE:  09/14/2013 DATE OF DISCHARGE:                              OPERATIVE REPORT   PREOPERATIVE DIAGNOSIS:  Right knee comminuted patella fracture.  POSTOPERATIVE DIAGNOSIS:  Right knee comminuted patella fracture.  PROCEDURE:  Right knee patellectomy removal of patellar component.  SURGEON:  Anderson Malta, M.D.  ASSISTANT:  None.  ANESTHESIA:  General.  INDICATIONS:  [name] is a 79 year old patient couple months out from the fall on her right knee.  She has had the knee revised.  She reports significant pain in the knee and presents for operative management after explanation, risks, and benefits with comminuted patellar fracture. Diagnosed by CT scan.  PROCEDURE IN DETAIL:  The patient was brought to operating room, where general anesthesia was induced.  Preop antibiotics were administered. Time-out was called.  Right leg was prescrubbed with alcohol and Betadine, which was allowed to air dry, prepped with DuraPrep solution and draped in sterile manner.  Charlie Pitter was used to cover the operative field.  Leg was elevated and exsanguinated with Esmarch wrap. Tourniquet inflated to 300 mmHg.  Total time 44 minutes.  Incision was made beginning proximal and distal to the patella.  The patient had an obvious patella fracture.  The patella was skeletonized.  The polyethylene component was removed.  The patella then was removed in multiple fragments.  The extensor mechanism was then reapproximated and tubularized using a series of #1 Vicryl sutures.  The capsule was first closed over the total knee component after thorough irrigation with 3 L of irrigating solution.  Following capsular closure that is when the extensor mechanism was reapproximated and  tubularized.  Anesthetic with Marcaine, morphine, and clonidine was performed.  A running #1 Vicryl suture was then placed over the periosteum for reinforcement lengthwise of the incision.  The incision was then closed using 2-0 Vicryl suture and the skin staples.  The patient was placed in a bulky dressing, knee immobilizer.  Tourniquet was released at 44 minutes.  Minimal bleeding present.  The patient was transferred to recovery room in stable condition.  She tolerated procedure well without immediate complication.     Anderson Malta, M.D.     GSD/MEDQ  D:  09/14/2013  T:  09/15/2013  Job:  [id number]

## 2013-09-15 NOTE — Significant Event (Signed)
Rapid Response Event Note  Overview:Called to assist with patient with decreased O2 sats Time Called: [id number] Arrival Time: 1536 Event Type: Respiratory;Hypotension  Initial Focused Assessment: On arrival patient supine in bed.  NAD.  Warm and dry.  Color pale.  Arouses to name - follows commands and answers questions appropriately but will fall back to sleep.  Resps reg and unlabored.  Bil BS equal and clear.  No JVD noted.  HR with some irregular beats with auscultation - will place on tele.  O2 via 2 liter nasal cannula - O2 sats 100%.  Bp manual 102/56  HR 59 RR 12.  Poor skin turgor.  RN and grandaughter state patient has been voiding without problems. Right leg knee immobilizer on - DDI - foot warm - good sensation.  Denies chest pain or SOB.  States she sleeps elevated at home - has bed that sits up.  RN and grandaughter report that patient had worked with PT - then went to bathroom - when she got back to bed she was tired and went sound to sleep about 1430.  At 1530 the RN Meredith Mody came to check on her - had difficulty waking her and on vital sign check her O2 sats were 78% on RA.  She was placed on 2 liter nasal cannula.  She had pain medicine around 1300 - it is the same pain meds she has been taking at home for quite a while and her grandaughter reports she did not have trouble at home with the meds.     Interventions:  Stat 12 lead EKG done.  Gwen RN speaking with Dr. Marlou Sa - raising question of PE - orders given.  Labs reviewed - creatine 1.38 on 09/09/2013 - no report of CRI per chart.  Dr. Marlou Sa ordered CTA - will need labs prior per CT protocol per CT staff - stat BMET ordered.  Patient easily aroused now - still sleepy - continue to deny pain or SOB.  O2 sats remaining 100% - O2 off for about 10 mins - O2 sats remain 98% - placed back on O2.  Attempt to start PIV x 1 for CTA - unable to thread.  Dr. Grandville Silos in for medical consult.  Update given.  BP 102/46 - HR 73 - RR 12 O2 sats 100% on 2 liters.   Labs drawn stat.  IV team present - line inserted for CTA.  Will await creatine report.  NS bolus started per Dr. Grandville Silos orders - 250cc over one hour.  Patient resting but easily aroused and oriented.  Will follow.  O2 sats remain stable.  BP soft - NS 500 cc IV bolus ordered per Dr. Grandville Silos.  CTA negative for PE.  Will watch as needed.     Event Summary: Name of Physician Notified: Dr,. Dean at  [name])  Name of Consulting Physician Notified: Dr. Grandville Silos at  (per Dr. Marlou Sa)  Outcome: Stayed in room and stabalized  Event End Time: 1900  Quin Hoop

## 2013-09-15 NOTE — Evaluation (Signed)
Occupational Therapy Evaluation Patient Details Name: [name] MRN: [id number] DOB: October 01, 1929 Today's Date: 09/15/2013 Time: 2440-1027 OT Time Calculation (min): 30 min  OT Assessment / Plan / Recommendation History of present illness [name] is an 80 year old female who underwent right total knee revision 2 years ago. She did very well with that. She had a fall in December and had significant knee pain. Plain radiographs were negative. She has had continued symptoms and notes some deformity around the patella. At the time of her revision she had the patellar button removed but she had a shallow patella left. Subsequent CT scan shows displaced comminuted fracture of the patella. She reports continued pain and wants to undergo operative treatment of this knee despite the inherent risk which are discussed fully with her.  She underwent patellectomy.   Clinical Impression   Pt presents with below problem list. Pt independent with ADLs, PTA and wanting to get back to that level. Feel pt will benefit from acute OT to increase independence prior to d/c. Recommending HHOT upon d/c. Pt planning to have assistance at home most of the day, but not 24/7.     OT Assessment  Patient needs continued OT Services    Follow Up Recommendations  Home health OT;Supervision - Intermittent (OOB/Mobility)    Barriers to Discharge      Equipment Recommendations  None recommended by OT    Recommendations for Other Services    Frequency  Min 2X/week    Precautions / Restrictions Precautions Precautions: Fall;Knee Required Braces or Orthoses: Knee Immobilizer - Right Knee Immobilizer - Right: On at all times Restrictions Weight Bearing Restrictions: Yes RLE Weight Bearing: Weight bearing as tolerated   Pertinent Vitals/Pain Pain 8/10. Repositioned.     ADL  Grooming: Wash/dry face;Minimal assistance Where Assessed - Grooming: Supported standing Upper Body Dressing: Set up Where Assessed - Upper Body  Dressing: Supported sitting Lower Body Dressing: Moderate assistance Where Assessed - Lower Body Dressing: Supported sit to Lobbyist: Minimal assistance Armed forces technical officer Method: Sit to Loss adjuster, chartered: Bedside commode Toileting - Clothing Manipulation and Hygiene: Minimal assistance Where Assessed - Best boy and Hygiene: Sit to stand from 3-in-1 or toilet Tub/Shower Transfer Method: Not assessed Equipment Used: Gait belt;Knee Immobilizer;Rolling walker;Sock aid;Reacher;Long-handled sponge Transfers/Ambulation Related to ADLs: Mod A for ambulation. Min A/Mod A for transfers. ADL Comments: Educated on dressing technique and recommended sitting for dressing and having chair behind her with walker in front when pulling up LB clothing. Discussed bag on walker to carry items. Recommended having family pick up rugs in house. Discussed safe shoewear. Educated on AE for LB ADLs.     OT Diagnosis: Acute pain  OT Problem List: Decreased strength;Decreased activity tolerance;Impaired balance (sitting and/or standing);Decreased knowledge of use of DME or AE;Decreased knowledge of precautions;Pain;Decreased range of motion OT Treatment Interventions: Self-care/ADL training;DME and/or AE instruction;Therapeutic activities;Patient/family education;Balance training   OT Goals(Current goals can be found in the care plan section) Acute Rehab OT Goals Patient Stated Goal: get back to traveling OT Goal Formulation: With patient Time For Goal Achievement: 09/22/13 Potential to Achieve Goals: Good ADL Goals Pt Will Perform Lower Body Dressing: with supervision;with adaptive equipment;sit to/from stand Pt Will Transfer to Toilet: with supervision;ambulating (3 in 1 over commode) Pt Will Perform Toileting - Clothing Manipulation and hygiene: with supervision;sit to/from stand  Visit Information  Last OT Received On: 09/15/13 Assistance Needed: +1 History of  Present Illness: [name] is an 80 year old female who underwent right  total knee revision 2 years ago. She did very well with that. She had a fall in December and had significant knee pain. Plain radiographs were negative. She has had continued symptoms and notes some deformity around the patella. At the time of her revision she had the patellar button removed but she had a shallow patella left. Subsequent CT scan shows displaced comminuted fracture of the patella. She reports continued pain and wants to undergo operative treatment of this knee despite the inherent risk which are discussed fully with her.  She underwent patellectomy.       Prior [place] expects to be discharged to:: Private residence Living Arrangements: Alone Available Help at Discharge: Family;Available PRN/intermittently Type of Home: House Home Access: Ramped entrance Home Layout: One level Home Equipment: Walker - 2 wheels;Bedside commode;Grab bars - toilet;Grab bars - tub/shower Additional Comments: pt reports she has family that can stay with her 24/7 when she gets home if she needs it. She has had multiple knee and back surgeries Prior Function Level of Independence: Independent with assistive device(s) Comments: was using RW prior to surgery due to knee pain Communication Communication: No difficulties         Vision/Perception     Cognition  Cognition Arousal/Alertness: Awake/alert Behavior During Therapy: WFL for tasks assessed/performed Overall Cognitive Status: Within Functional Limits for tasks assessed    Extremity/Trunk Assessment Upper Extremity Assessment Upper Extremity Assessment: Overall WFL for tasks assessed Lower Extremity Assessment Lower Extremity Assessment: Defer to PT evaluation RLE Deficits / Details: limited evaluation due to KI, hip flex 2+/5, ankle WFL RLE: Unable to fully assess due to immobilization LLE Deficits / Details: pt reports  weakness of LLE and she usually wears a knee brace, knee grossly 3/5, hip flex 3+/5 Cervical / Trunk Assessment Cervical / Trunk Assessment: Normal     Mobility Bed Mobility Overal bed mobility: Needs Assistance Bed Mobility: Supine to Sit;Sit to Supine Supine to sit: Min guard Sit to supine: Min guard;Mod assist (Assistance to scoot [facility]) General bed mobility comments: Assistance to scoot to [facility] (Mod A).  Transfers Overall transfer level: Needs assistance Equipment used: Rolling walker (2 wheeled) Transfers: Sit to/from Stand Sit to Stand: Mod assist;Min assist General transfer comment: Mod A for sit to stand from bed.            End of Session OT - End of Session Equipment Utilized During Treatment: Gait belt;Rolling walker;Right knee immobilizer Activity Tolerance: Patient tolerated treatment well Patient left: in bed;with call bell/phone within reach;with family/visitor present  GO     Benito Mccreedy OTR/L [phone] 09/15/2013, 2:57 PM

## 2013-09-15 NOTE — Progress Notes (Signed)
Utilization review completed.  

## 2013-09-15 NOTE — Progress Notes (Signed)
Orthopedic Tech Progress Note Patient Details:  [name] [birth year] [id number]  Ortho Devices Type of Ortho Device: Knee Immobilizer Ortho Device/Splint Interventions: Application   Cammer, Theodoro Parma 09/15/2013, 9:16 AM

## 2013-09-15 NOTE — Care Management Note (Signed)
CARE MANAGEMENT NOTE 09/15/2013  Patient:  [name], [name]   Account Number:  [id number]  Date Initiated:  09/15/2013  Documentation initiated by:  Ricki Miller  Subjective/Objective Assessment:   80 yr old female s/p right Patellectomy     Action/Plan:   Patient sleeping, Case Manager spoke with patient's granddaughter-Robin-who will be assisting in her care. Choice offered. Patient has rolling walker, 3in1 at Home. Family to provide 24/7 care. Referral called to [facility]..   Anticipated DC Date:  09/17/2013   Anticipated DC Plan:  [place]  CM consult      PAC Choice  [place]   Choice offered to / List presented to:  C-4 Adult Children   DME arranged  NA        HH arranged  HH-2 PT  HH-3 OT      [facility] agency  [place]   Status of service:  In process, will continue to follow Medicare Important Message given?   (If response is "NO", the following Medicare IM given date fields will be blank) Date Medicare IM given:   Date Additional Medicare IM given:    Discharge Disposition:    Per UR Regulation:    If discussed at Long Length of Stay Meetings, dates discussed:

## 2013-09-15 NOTE — Progress Notes (Signed)
Subjective: Pt stable - pain controlled   Objective: Vital signs in last 24 hours: Temp:  [97.8 F (36.6 C)-98.9 F (37.2 C)] 98.6 F (37 C) (03/04 0557) Pulse Rate:  [57-77] 72 (03/04 0557) Resp:  [12-24] 18 (03/04 0557) BP: (104-144)/(44-71) 109/58 mmHg (03/04 0557) SpO2:  [94 %-100 %] 98 % (03/04 0557)  Intake/Output from previous day: 03/03 0701 - 03/04 0700 In: 1275 [P.O.:40; I.V.:1235] Out: -  Intake/Output this shift:    Exam:  Sensation intact distally Dorsiflexion/Plantar flexion intact Compartment soft  Labs:  Recent Labs  09/14/13 1327  HGB 9.9*    Recent Labs  09/14/13 1327  HCT 29.0*    Recent Labs  09/14/13 1327  NA 143  K 5.0  GLUCOSE 99   No results found for this basename: LABPT, INR,  in the last 72 hours  Assessment/Plan: Plan PT today - keep leg in knee immobilizer   DEAN,GREGORY SCOTT 09/15/2013, 7:03 AM

## 2013-09-15 NOTE — Consult Note (Signed)
[facility] Medical Consultation  [name] [id number] DOB: [birth year] DOA: 09/14/2013 PCP: Nyoka Cowden, MD   Requesting physician: Dr Meredith Pel Date of consultation: 09/15/13 Reason for consultation: hypoxia, lethargy, hyperkalemia  Impression/Recommendations Principal Problem:   Patella fracture Active Problems:   Hypoxia   Lethargy   Hypotension, unspecified   DEPRESSION   HYPERTENSION   CORONARY ARTERY DISEASE   GERD  #1 hypoxia Questionable etiology. [place] have just been a transient hypoxia. Patient's oxygen saturations have improved currently mid 90s to 100% on 2 L nasal cannula. Oxygen saturations on room air are also in the mid 90s. Patient in no acute cardiopulmonary distress. CT has been ordered by primary team to rule out pulmonary embolism. Will check a d-dimer. Check a CBC, Check a comprehensive metabolic profile, cardiac enzymes every 6 hours x3, pro BNP. Follow.  #2 hypotension Likely secondary to hypovolemia. Will check a comprehensive metabolic profile. Check a CBC. Check a UA with cultures and sensitivities. CT of the chest has been ordered and is pending. If unable to get a CT of the chest to get a chest x-ray. Cycle cardiac enzymes every 6 hours x3. Patient looks clinically dehydrated. Will discontinue spironolactone. Continue Lasix. Hold blood pressure medications. Hydrate with IV fluids. Follow.  #3 lethargy Likely secondary to problem #2. Patient given some norco which patient has chronically been on and doubt if norco is cause of her lethargy. Patient is currently alert and following commands and does state she was just tired and was trying to take a nap. Follow.  #4 hyperkalemia Patient was noted to be hyperkalemic 09/08/2013. Will check a comprehensive metabolic profile. Patient was on ACE inhibitor, potassium sparing diuretics of spironolactone prior to admission. Will hold these medications for now and follow.  #5  hypertension Hold blood pressure medications for now. Secondary to problem #2. Follow and resume blood pressure medications slowly as blood pressure improved.  #6 dehydration IV fluids.  #7 patella fracture Status post repair. Per primary team.   #8 gastroesophageal reflux disease PPI.  #9 coronary artery disease Stable. Diuretics, beta blocker, ACE inhibitor, spironolactone on hold secondary to hypotension. As patient's blood pressure improves Protzman resume slowly starting off with beta blocker.  I will followup again tomorrow. Please contact me if I can be of assistance in the meanwhile. Thank you for this consultation.  Chief Complaint: Right knee pain  HPI:  [name] is an 80 year old pleasant female with history of anemia, coronary artery disease status post bare-metal stent to the RCA 4 2009, history of chronic diastolic CHF, depression, status post right total knee revision 2 years ago who was admitted to the orthopedic service with a right knee comminuted patellar fracture. Patient subsequently underwent right knee patellectomy removal of patellar component on 09/14/2013. Patient tolerated the procedure well. Today on 09/15/2013 patient was doing fine went to bed around 2:30 PM this afternoon. Around 3:30 PM patient's granddaughter tried to wake her up however was unable to. Oxygen saturations is rechecked at that time was 78% on room air. Patient was also noted to have a systolic blood pressure in the 80s. Patient was placed on oxygen with sats going up to 100%. Rapid response was also called. EKG done showed a normal sinus rhythm. Patient's blood pressure subsequently went up to 97/52. Patient's oxygen saturations improved to the 90s to the 100s on 2 L nasal cannula. Patient became more alert. Patient without any complaints. Patient denies any chest pain, no shortness of breath, no  fever, no chills, no emesis, no abdominal pain, no diarrhea, no constipation, no melena, no hematemesis,  no hematochezia, no weakness. Patient does states she started sleeping. We will called to consult on the patient for lethargy, hypoxia and a prior history of hyperkalemia. Patient is currently alert and following commands in no acute cardiopulmonary distress.  Review of Systems:  As per history of present illness otherwise negative.  Past Medical History  Diagnosis Date  . ANEMIA 06/30/2007  . COLONIC POLYPS, HX OF 01/21/2007  . COLONIC POLYPS, HX OF 01/21/2007  . DEGENERATIVE JOINT DISEASE 05/19/2007  . DEPRESSION 02/07/2010  . DIASTOLIC HEART FAILURE, CHRONIC 02/27/2009  . DIVERTICULOSIS, COLON 01/21/2007  . DYSPNEA 11/13/2009  . Edema 11/13/2009  . GERD 01/21/2007  . HIATAL HERNIA WITH REFLUX 01/21/2007  . HYPERTENSION 01/21/2007  . LIMB PAIN 11/13/2009  . NECK PAIN, CHRONIC 02/13/2010  . Pure hypercholesterolemia 04/05/2008  . PONV (postoperative nausea and vomiting)     Pt had problems with memory after procedure  . Colitis     Hx: of  . Pneumonia   . Bronchitis     Hx: of  . FHx: allergies     Hx;of  . Heart palpitations     Hx: of  . Headache(784.0)   . CORONARY ARTERY DISEASE 04/05/2008    BMS to RCA 10/2007  . TIA (transient ischemic attack)    Past Surgical History  Procedure Laterality Date  . Appendectomy    . Cholecystectomy    . Abdominal hysterectomy    . Lumbar laminectomy    . Total knee arthroplasty    . Rotator cuff repair    . Cardiac stent    . Neck surgery    . Cataract extraction w/ intraocular lens  implant, bilateral      Hx: of  . Cardiac catheterization      2009  . Tonsillectomy    . Dilation and curettage of uterus    . Patellectomy Right 09/14/2013    DR Marlou Sa  . Patellectomy Right 09/14/2013    Procedure: PATELLECTOMY;  Surgeon: Meredith Pel, MD;  Location: [facility];  Service: Orthopedics;  Laterality: Right;   Social History:  reports that she quit smoking about 46 years ago. She has never used smokeless tobacco. She reports that she does not drink  alcohol or use illicit drugs.  No Known Allergies Family History  Problem Relation Age of Onset  . Osteoarthritis Mother   . Heart failure Mother     Prior to Admission medications   Medication Sig Start Date End Date Taking? Authorizing Provider  baclofen (LIORESAL) 10 MG tablet Take 10 mg by mouth 3 (three) times daily.   Yes Historical Provider, MD  benazepril (LOTENSIN) 40 MG tablet Take 1 tablet (40 mg total) by mouth daily. 09/02/13  Yes Marletta Lor, MD  ferrous sulfate 325 (65 FE) MG tablet Take 325 mg by mouth daily with breakfast.    Yes Historical Provider, MD  furosemide (LASIX) 20 MG tablet Take 1 tablet (20 mg total) by mouth daily as needed. 09/02/13  Yes Marletta Lor, MD  gabapentin (NEURONTIN) 600 MG tablet Take 1 tablet (600 mg total) by mouth 3 (three) times daily. 08/19/13  Yes Marletta Lor, MD  HYDROcodone-acetaminophen (NORCO/VICODIN) 5-325 MG per tablet Take 1 tablet by mouth every 6 (six) hours as needed for moderate pain.   Yes Historical Provider, MD  LORazepam (ATIVAN) 0.5 MG tablet Take 0.5 mg by mouth every  8 (eight) hours as needed for anxiety.   Yes Historical Provider, MD  metoprolol (LOPRESSOR) 50 MG tablet Take 1 tablet (50 mg total) by mouth 2 (two) times daily. 09/02/13  Yes Marletta Lor, MD  omeprazole (PRILOSEC) 40 MG capsule Take 1 capsule (40 mg total) by mouth daily. 07/22/12 09/01/13 Yes Marletta Lor, MD  sertraline (ZOLOFT) 50 MG tablet Take 1 tablet (50 mg total) by mouth daily. 09/02/13  Yes Marletta Lor, MD  simvastatin (ZOCOR) 40 MG tablet Take 1 tablet (40 mg total) by mouth at bedtime. 09/02/13  Yes Marletta Lor, MD  spironolactone (ALDACTONE) 25 MG tablet Take 1 tablet (25 mg total) by mouth daily. 08/19/13  Yes Marletta Lor, MD  traMADol (ULTRAM) 50 MG tablet Take by mouth every 6 (six) hours as needed for moderate pain.   Yes Historical Provider, MD  traZODone (DESYREL) 100 MG tablet Take 50 mg by  mouth at bedtime.   Yes Historical Provider, MD  nitroGLYCERIN (NITROSTAT) 0.4 MG SL tablet Place 0.4 mg under the tongue every 5 (five) minutes as needed for chest pain.     Historical Provider, MD  triamcinolone cream (KENALOG) 0.1 % Apply 1 application topically 2 (two) times daily as needed (Eczema on legs).    Historical Provider, MD   Physical Exam: Blood pressure 97/52, pulse 65, temperature 97.6 F (36.4 C), temperature source Oral, resp. rate 12, SpO2 98.00%. Filed Vitals:   09/15/13 1548  BP:   Pulse: 65  Temp:   Resp: 12     General:  Elderly female well-developed well-nourished in no acute cardiopulmonary distress.  Eyes: Pupils equal round and reactive to light and accommodation. Extraocular movements intact.  ENT: Oropharynx is clear, no lesions, no exudates. Dry mucous membranes.  Neck: Supple with no lymphadenopathy. No JVD.  Cardiovascular: Regular rate rhythm no murmurs rubs or gallops.  Respiratory: Clear to auscultation bilaterally. No wheezes, no crackles, no rhonchi.  Abdomen: Soft, nontender, nondistended, positive bowel sounds.  Skin: No rashes or lesions.  Musculoskeletal: No clubbing cyanosis or edema. Right lower extremity in a knee immobilizer.  Psychiatric: Normal mood. Normal affect. Good insight. Good judgment.  Neurologic: Alert and or to x3. Cranial nerves II through XII are grossly intact. Sensation is intact. Gait not tested secondary to safety.  Labs on Admission:  Basic Metabolic Panel:  Recent Labs Lab 09/08/13 1926 09/14/13 1327  NA 141 143  K 5.2 5.0  CL 102  --   CO2 26  --   GLUCOSE 95 99  BUN 38*  --   CREATININE 1.38*  --   CALCIUM 9.1  --    Liver Function Tests:  Recent Labs Lab 09/08/13 1926  AST 26  ALT 12  ALKPHOS 65  BILITOT <0.2*  PROT 7.7  ALBUMIN 3.9   No results found for this basename: LIPASE, AMYLASE,  in the last 168 hours No results found for this basename: AMMONIA,  in the last 168  hours CBC:  Recent Labs Lab 09/08/13 1926 09/14/13 1327  WBC 7.4  --   NEUTROABS 4.6  --   HGB 8.9* 9.9*  HCT 29.4* 29.0*  MCV 79.7  --   PLT 188  --    Cardiac Enzymes: No results found for this basename: CKTOTAL, CKMB, CKMBINDEX, TROPONINI,  in the last 168 hours BNP: No components found with this basename: POCBNP,  CBG:  Recent Labs Lab 09/15/13 [place]  on Admission: No results found.  EKG: Independently reviewed. NSR  Time spent: Fallis MD [facility] Pager [phone]  If 7PM-7AM, please contact night-coverage [email or web address] Password [email or web address] 09/15/2013, 5:14 PM

## 2013-09-16 DIAGNOSIS — I10 Essential (primary) hypertension: Secondary | ICD-10-CM

## 2013-09-16 DIAGNOSIS — I5032 Chronic diastolic (congestive) heart failure: Secondary | ICD-10-CM

## 2013-09-16 DIAGNOSIS — N390 Urinary tract infection, site not specified: Secondary | ICD-10-CM

## 2013-09-16 LAB — URINALYSIS, ROUTINE W REFLEX MICROSCOPIC
Bilirubin Urine: NEGATIVE
Glucose, UA: NEGATIVE mg/dL
Ketones, ur: NEGATIVE mg/dL
Nitrite: NEGATIVE
Protein, ur: NEGATIVE mg/dL
Specific Gravity, Urine: 1.015 (ref 1.005–1.030)
Urobilinogen, UA: 0.2 mg/dL (ref 0.0–1.0)
pH: 5.5 (ref 5.0–8.0)

## 2013-09-16 LAB — BASIC METABOLIC PANEL
BUN: 21 mg/dL (ref 6–23)
CO2: 23 mEq/L (ref 19–32)
Calcium: 8.5 mg/dL (ref 8.4–10.5)
Chloride: 109 mEq/L (ref 96–112)
Creatinine, Ser: 1.01 mg/dL (ref 0.50–1.10)
GFR calc Af Amer: 59 mL/min — ABNORMAL LOW (ref >90)
GFR calc non Af Amer: 51 mL/min — ABNORMAL LOW (ref >90)
Glucose, Bld: 103 mg/dL — ABNORMAL HIGH (ref 70–99)
Potassium: 4.3 mEq/L (ref 3.7–5.3)
Sodium: 143 mEq/L (ref 137–147)

## 2013-09-16 LAB — URINE MICROSCOPIC-ADD ON

## 2013-09-16 LAB — CBC
HCT: 26.6 % — ABNORMAL LOW (ref 36.0–46.0)
Hemoglobin: 8 g/dL — ABNORMAL LOW (ref 12.0–15.0)
MCH: 24.3 pg — ABNORMAL LOW (ref 26.0–34.0)
MCHC: 30.1 g/dL (ref 30.0–36.0)
MCV: 80.9 fL (ref 78.0–100.0)
Platelets: 150 10*3/uL (ref 150–400)
RBC: 3.29 MIL/uL — ABNORMAL LOW (ref 3.87–5.11)
RDW: 16.1 % — ABNORMAL HIGH (ref 11.5–15.5)
WBC: 5.2 10*3/uL (ref 4.0–10.5)

## 2013-09-16 LAB — VITAMIN B12: Vitamin B-12: 1385 pg/mL — ABNORMAL HIGH (ref 211–911)

## 2013-09-16 LAB — FOLATE: Folate: >20 ng/mL

## 2013-09-16 LAB — TROPONIN I: Troponin I: <0.3 ng/mL (ref <0.30)

## 2013-09-16 LAB — FERRITIN: Ferritin: 28 ng/mL (ref 10–291)

## 2013-09-16 MED ORDER — FUROSEMIDE 10 MG/ML IJ SOLN
20.0000 mg | Freq: Once | INTRAMUSCULAR | Status: AC
  Administered 2013-09-16: 20 mg via INTRAVENOUS
  Filled 2013-09-16: qty 2

## 2013-09-16 MED ORDER — DEXTROSE 5 % IV SOLN
1.0000 g | INTRAVENOUS | Status: DC
  Administered 2013-09-16 – 2013-09-17 (×2): 1 g via INTRAVENOUS
  Filled 2013-09-16 (×3): qty 10

## 2013-09-16 NOTE — Progress Notes (Signed)
Subjective: Pt stable this am - pain controlled - appreciate int med consult   Objective: Vital signs in last 24 hours: Temp:  [97.6 F (36.4 C)-99.2 F (37.3 C)] 98 F (36.7 C) (03/05 0635) Pulse Rate:  [56-101] 68 (03/05 0635) Resp:  [8-18] 18 (03/05 0756) BP: (86-125)/(42-64) 125/50 mmHg (03/05 0635) SpO2:  [78 %-100 %] 100 % (03/05 0756)  Intake/Output from previous day: 03/04 0701 - 03/05 0700 In: 600 [P.O.:600] Out: 400 [Urine:400] Intake/Output this shift:    Exam:  Neurovascular intact Sensation intact distally Intact pulses distally  Labs:  Recent Labs  09/14/13 1327 09/15/13 1638 09/16/13 0520  HGB 9.9* 7.9* 8.0*    Recent Labs  09/15/13 1638 09/16/13 0520  WBC 8.6 5.2  RBC 3.30* 3.29*  HCT 26.5* 26.6*  PLT 154 150    Recent Labs  09/15/13 1727 09/16/13 0520  NA 137 143  K 4.7 4.3  CL 101 109  CO2 20 23  BUN 29* 21  CREATININE 1.19* 1.01  GLUCOSE 103* 103*  CALCIUM 8.4 8.5   No results found for this basename: LABPT, INR,  in the last 72 hours  Assessment/Plan: CT neg for PE - mobilize with therapy today - starting hgb low will follow   Shellene Sweigert SCOTT 09/16/2013, 8:23 AM

## 2013-09-16 NOTE — Progress Notes (Signed)
[facility] PROGRESS NOTE  [name] [id number] DOB: 06/01/1930 DOA: 09/14/2013 PCP: Nyoka Cowden, MD  Assessment/Plan: 1.Hypoxia/probable acute on chronic diastolic CHF -CT angiogram negative for PE -BNP elevated at 1858, and patient has history of chronic diastolic CHF -Will give dose of IV Lasix, monitor fluid status and diuresis when necessary -Hep-Lock IVF 2. Hypotension -Resolved off BP meds, follow 3. Probable urinary tract infection -Will start empiric Rocephin -Follow urine cultures and further treat accordingly 4. Hyperkalemia -Resolved off spironolactone and ACE inhibitor- Continue holding for now. 5. History of Hypertension -ACE held secondary to hypotension as above, follow and resume when clinically appropriate 6. Patellar fracture -Per primary team, status post surgical repair 7.CAD -She is chest pain free, follow resume ACE inhibitor, beta blocker, spironolactone when clinically appropriate Code Status: Full Family Communication: Family at bedside Disposition Plan: Per primary team    Antibiotics:  Rocephin started 3/5  HPI/Subjective: Patient denies shortness of breath at rest, denies chest pain.  Objective: Filed Vitals:   09/16/13 1628  BP:   Pulse:   Temp:   Resp: 18    Intake/Output Summary (Last 24 hours) at 09/16/13 1823 Last data filed at 09/16/13 1323  Gross per 24 hour  Intake    240 ml  Output    750 ml  Net   -510 ml   There were no vitals filed for this visit.  Exam:  General: alert & oriented x 3 In NAD Cardiovascular: RRR, nl S1 s2 Respiratory: Decreased breath sounds at the bases Abdomen: soft +BS NT/ND, no masses palpable Extremities: No cyanosis and no edema    Data Reviewed: Basic Metabolic Panel:  Recent Labs Lab 09/14/13 1327 09/15/13 1638 09/15/13 1727 09/16/13 0520  NA 143  --  137 143  K 5.0  --  4.7 4.3  CL  --   --  101 109  CO2  --   --  20 23  GLUCOSE 99  --  103* 103*  BUN   --   --  29* 21  CREATININE  --   --  1.19* 1.01  CALCIUM  --   --  8.4 8.5  MG  --  2.0  --   --    Liver Function Tests:  Recent Labs Lab 09/15/13 1727  AST 124*  ALT 48*  ALKPHOS 107  BILITOT 0.3  PROT 6.5  ALBUMIN 3.1*   No results found for this basename: LIPASE, AMYLASE,  in the last 168 hours No results found for this basename: AMMONIA,  in the last 168 hours CBC:  Recent Labs Lab 09/14/13 1327 09/15/13 1638 09/16/13 0520  WBC  --  8.6 5.2  HGB 9.9* 7.9* 8.0*  HCT 29.0* 26.5* 26.6*  MCV  --  80.3 80.9  PLT  --  154 150   Cardiac Enzymes:  Recent Labs Lab 09/15/13 1726 09/15/13 2245 09/16/13 0520  TROPONINI <0.30 <0.30 <0.30   BNP (last 3 results)  Recent Labs  09/15/13 1640  PROBNP 1858.0*   CBG:  Recent Labs Lab 09/15/13 1624  GLUCAP 88    No results found for this or any previous visit (from the past 240 hour(s)).   Studies: Ct Angio Chest Pe W/cm &/or Wo Cm  09/15/2013   CLINICAL DATA:  Hypoxia. Syncope. Postop from right knee surgery. Clinical suspicion for pulmonary embolism.  EXAM: CT ANGIOGRAPHY CHEST WITH CONTRAST  TECHNIQUE: Multidetector CT imaging of the chest was performed using the standard protocol during bolus administration  of intravenous contrast. Multiplanar CT image reconstructions and MIPs were obtained to evaluate the vascular anatomy.  CONTRAST:  75mL OMNIPAQUE IOHEXOL 350 MG/ML SOLN  COMPARISON:  06/20/2008  FINDINGS: Satisfactory opacification of pulmonary arteries noted, and no pulmonary emboli identified. No evidence of thoracic aortic dissection or aneurysm. No evidence of mediastinal hematoma or mass. A moderate size hiatal hernia is noted. No hilar or mediastinal masses are identified. No adenopathy seen elsewhere within the thorax.  Mild cardiomegaly is noted. No evidence of pleural or pericardial effusion. Mild bibasilar atelectasis or scarring is noted. No evidence of pulmonary consolidation or mass. No central  endobronchial lesion identified.  Review of the MIP images confirms the above findings.  IMPRESSION: No evidence of pulmonary embolism.  Mild bibasilar atelectasis versus scarring. No evidence of pulmonary airspace disease or mass.  Moderate size hiatal hernia.  Mild cardiomegaly.   Electronically Signed   By: Earle Gell M.D.   On: 09/15/2013 19:39    Scheduled Meds: . aspirin EC  325 mg Oral Q breakfast  . baclofen  10 mg Oral TID  . cefTRIAXone (ROCEPHIN)  IV  1 g Intravenous Q24H  . docusate sodium  100 mg Oral BID  . ferrous sulfate  325 mg Oral Q breakfast  . gabapentin  600 mg Oral TID  . pantoprazole  40 mg Oral Daily  . sertraline  50 mg Oral Daily  . simvastatin  40 mg Oral QHS  . traZODone  50 mg Oral QHS   Continuous Infusions: . sodium chloride 100 mL/hr at 09/16/13 8338    Principal Problem:   Patella fracture Active Problems:   DEPRESSION   HYPERTENSION   CORONARY ARTERY DISEASE   GERD   Hypoxia   Lethargy   Hypotension, unspecified   Dehydration    Time spent:>35MINS    [facility] Pager [phone]. If 7PM-7AM, please contact night-coverage at [email or web address], password [facility] 09/16/2013, 6:23 PM  LOS: 2 days

## 2013-09-16 NOTE — Progress Notes (Signed)
Physical Therapy Treatment Patient Details Name: [name] MRN: [id number] DOB: April 23, 1930 Today's Date: 09/16/2013 Time: 2355-7322 PT Time Calculation (min): 22 min  PT Assessment / Plan / Recommendation  History of Present Illness [name] is an 80 year old female who underwent right total knee revision 2 years ago. She did very well with that. She had a fall in December and had significant knee pain. Plain radiographs were negative. She has had continued symptoms and notes some deformity around the patella. At the time of her revision she had the patellar button removed but she had a shallow patella left. Subsequent CT scan shows displaced comminuted fracture of the patella. She reports continued pain and wants to undergo operative treatment of this knee despite the inherent risk which are discussed fully with her.  She underwent patellectomy.   PT Comments   Therapy limited by pain, however pt was willing to ambulate up to 12 feet with Mod A from the reclining chair and into bed. Good strength for basic transfer into bed however limited again by pain requiring assist for positioning. PT will continue to follow and work towards stated goals before discharge.   Follow Up Recommendations  Home health PT;Supervision for mobility/OOB     Does the patient have the potential to tolerate intense rehabilitation     Barriers to Discharge        Equipment Recommendations  None recommended by PT    Recommendations for Other Services    Frequency Min 5X/week   Progress towards PT Goals Progress towards PT goals: Progressing toward goals  Plan Current plan remains appropriate    Precautions / Restrictions Precautions Precautions: Fall;Knee Required Braces or Orthoses: Knee Immobilizer - Right Knee Immobilizer - Right: On at all times Restrictions Weight Bearing Restrictions: Yes RLE Weight Bearing: Weight bearing as tolerated   Pertinent Vitals/Pain Pain stated as 6/10 Nurse recently  administered medication and informed PT not to over exert patient Repositioned in bed for comfort with knee immobilizer and ice in place    Mobility  Bed Mobility Overal bed mobility: Needs Assistance Bed Mobility: Sit to Supine Sidelying to sit: Mod assist General bed mobility comments: Pt requires Min assist for supporting RLE into bed for pain (has good strength to lift leg herself), however Mod A to reposition in bed. Transfers Overall transfer level: Needs assistance Equipment used: Rolling walker (2 wheeled) Transfers: Sit to/from Stand Sit to Stand: Mod assist General transfer comment: Mod A for sit<stand from recliner to RW. Verbal cues for hand and leg placement. Cues to stand upright. Ambulation/Gait Ambulation/Gait assistance: Mod assist Ambulation Distance (Feet): 12 Feet Assistive device: Rolling walker (2 wheeled) Gait Pattern/deviations: Step-to pattern;Decreased step length - left;Decreased step length - right;Trunk flexed;Narrow base of support Gait velocity: decreased General Gait Details: Knee immobilizer in place and prevented buckling. Mod A for safety and advancing RW forward, with frequent verbal cues for sequencing, upright posture, and foot placement.     Exercises     PT Diagnosis:    PT Problem List:   PT Treatment Interventions:     PT Goals (current goals can now be found in the care plan section) Acute Rehab PT Goals PT Goal Formulation: With patient Time For Goal Achievement: 09/22/13 Potential to Achieve Goals: Good  Visit Information  Last PT Received On: 09/16/13 Assistance Needed: +1 History of Present Illness: [name] is an 80 year old female who underwent right total knee revision 2 years ago. She did very well with that. She had a  fall in December and had significant knee pain. Plain radiographs were negative. She has had continued symptoms and notes some deformity around the patella. At the time of her revision she had the patellar button  removed but she had a shallow patella left. Subsequent CT scan shows displaced comminuted fracture of the patella. She reports continued pain and wants to undergo operative treatment of this knee despite the inherent risk which are discussed fully with her.  She underwent patellectomy.    Subjective Data  Subjective: "I don't feel all that well today, I don't even remember everything that happend to me last night." Pt requests to get back in bed as she is very tired and has been up in the chair for about 2 hours.   Cognition  Cognition Arousal/Alertness: Awake/alert Behavior During Therapy: WFL for tasks assessed/performed Overall Cognitive Status: Within Functional Limits for tasks assessed    Balance     End of Session PT - End of Session Equipment Utilized During Treatment: Gait belt;Right knee immobilizer Activity Tolerance: Patient limited by fatigue;Patient limited by pain Patient left: in bed;with call bell/phone within reach;with family/visitor present;Other (comment) (Knee immobilizer on with ice in place) Nurse Communication: Mobility status   GP    [place], [place]  [name] 09/16/2013, 12:17 PM

## 2013-09-16 NOTE — Care Management Note (Signed)
CARE MANAGEMENT NOTE 09/16/2013  Patient:  NAYDA, RIESEN   Account Number:  [id number]  Date Initiated:  09/15/2013  Documentation initiated by:  Ricki Miller  Subjective/Objective Assessment:   80 yr old female s/p right Patellectomy     Action/Plan:   Patient sleeping, Case Manager spoke with patient's granddaughter-Robin-who will be assisting in her care. Choice offered. Patient has rolling walker, 3in1 at Home. Family to provide 24/7 care. Referral called to [place]..   Anticipated DC Date:  09/17/2013   Anticipated DC Plan:  [place]  CM consult      PAC Choice  [place]   Choice offered to / List presented to:  C-4 Adult Children   DME arranged  NA        HH arranged  HH-2 PT  HH-3 OT      [facility] agency  [place]   Status of service:  Completed, signed off Medicare Important Message given?   (If response is "NO", the following Medicare IM given date fields will be blank) Date Medicare IM given:   Date Additional Medicare IM given:    Discharge Disposition:  [place]

## 2013-09-16 NOTE — Progress Notes (Addendum)
Pt received 500 cc bolus of NS. Lungs clear throughout administration. VSS following completion of bolus. Pt's color improving. Pt stated that she was "feeling better". Nursing will continue to monitor.

## 2013-09-16 NOTE — Progress Notes (Signed)
Occupational Therapy Treatment Patient Details Name: [name] MRN: [id number] DOB: 1929/07/31 Today's Date: 09/16/2013 Time: 7253-6644 OT Time Calculation (min): 48 min  OT Assessment / Plan / Recommendation  History of present illness [name] is an 80 year old female who underwent right total knee revision 2 years ago. She did very well with that. She had a fall in December and had significant knee pain. Plain radiographs were negative. She has had continued symptoms and notes some deformity around the patella. At the time of her revision she had the patellar button removed but she had a shallow patella left. Subsequent CT scan shows displaced comminuted fracture of the patella. She reports continued pain and wants to undergo operative treatment of this knee despite the inherent risk which are discussed fully with her.  She underwent patellectomy.   OT comments  This 80 yo female admitted and underwent above presents to acute OT at lower levels than she was yesterday possibly due to change in pain meds. Spoke with daughter in room and she states that with current schedule pt [place] be alone 4 hours a day; explained that I would recommend 24/7 at least initially until they feel they can back down on the hours safely--she voiced understanding. I did broach the subject of short term rehab; however she would rather have the patient at home.  Follow Up Recommendations  Home health OT;Supervision/Assistance - 24 hour (talked with daughter and made her aware that pt really needs 24/7 A/S intially)       Equipment Recommendations  None recommended by OT       Frequency Min 2X/week   Progress towards OT Goals Progress towards OT goals: Not progressing toward goals - comment (not doing as well as she did yesterday--?due to increased pain due to decrease in pain meds due to decreased vitals yesterday)  Plan Discharge plan needs to be updated    Precautions / Restrictions Precautions Precautions:  Fall;Knee Required Braces or Orthoses: Knee Immobilizer - Right Knee Immobilizer - Right: On at all times Restrictions Weight Bearing Restrictions: Yes RLE Weight Bearing: Weight bearing as tolerated (with KI on)   Pertinent Vitals/Pain 8/10; repositioned, made RN aware (however 1 hour before pt could have anything else for pain)    ADL  Toilet Transfer: Minimal assistance Toilet Transfer Method: Sit to stand Toilet Transfer Equipment: Bedside commode Toileting - Clothing Manipulation and Hygiene: +1 Total assistance Where Assessed - Toileting Clothing Manipulation and Hygiene: Standing (could not maintain balance and do peri-care) Transfers/Ambulation Related to ADLs: Min A stand pivot ADL Comments: Talked with family about donning pants in supine or sitting EOB with RLE kept straight (placing KI over pants)--when having to leave the house. At home the simpliest thing to do is wear gown with or without underwear.       OT Goals(current goals can now be found in the care plan section)    Visit Information  Last OT Received On: 09/16/13 Assistance Needed: +1 History of Present Illness: [name] is an 80 year old female who underwent right total knee revision 2 years ago. She did very well with that. She had a fall in December and had significant knee pain. Plain radiographs were negative. She has had continued symptoms and notes some deformity around the patella. At the time of her revision she had the patellar button removed but she had a shallow patella left. Subsequent CT scan shows displaced comminuted fracture of the patella. She reports continued pain and wants to undergo operative treatment of  this knee despite the inherent risk which are discussed fully with her.  She underwent patellectomy.          Cognition  Cognition Arousal/Alertness: Awake/alert Behavior During Therapy: WFL for tasks assessed/performed Overall Cognitive Status: Within Functional Limits for tasks assessed     Mobility  Bed Mobility Overal bed mobility: Needs Assistance Bed Mobility: Supine to Sit Supine to sit: Min assist (RLE A and squencing cues) Transfers Overall transfer level: Needs assistance Equipment used: Rolling walker (2 wheeled) Transfers: Sit to/from Stand Sit to Stand: Min assist (bed) General transfer comment: VCs for hand placement          End of Session OT - End of Session Equipment Utilized During Treatment: Gait belt;Rolling walker;Right knee immobilizer Activity Tolerance: Patient limited by pain Patient left: in chair;with call bell/phone within reach;with family/visitor present       Almon Register [phone] 09/16/2013, 1:02 PM

## 2013-09-16 NOTE — Progress Notes (Signed)
Since NS bolus at beginning of this shift, pt's HR has increased to low 100s to 120s from low 60s. Other VS have remained stable/unchanged. Episodes of lethargy remain, but pt arousable with effort. Paged K. Schorr to give update on pt's status. No new orders received. Nursing will continue to monitor.

## 2013-09-16 NOTE — Progress Notes (Signed)
At completion of NS IV bolus.

## 2013-09-17 DIAGNOSIS — D649 Anemia, unspecified: Secondary | ICD-10-CM

## 2013-09-17 LAB — IRON AND TIBC
Iron: 14 ug/dL — ABNORMAL LOW (ref 42–135)
Saturation Ratios: 5 % — ABNORMAL LOW (ref 20–55)
TIBC: 266 ug/dL (ref 250–470)
UIBC: 252 ug/dL (ref 125–400)

## 2013-09-17 MED ORDER — ASPIRIN EC 325 MG PO TBEC: 325.0000 mg | DELAYED_RELEASE_TABLET | Freq: Every day | ORAL | Status: DC

## 2013-09-17 NOTE — Progress Notes (Signed)
[facility] PROGRESS NOTE  [name] WNU:[id number] DOB: 09-24-1929 DOA: 09/14/2013 PCP: Nyoka Cowden, MD  Assessment/Plan: 1.Hypoxia/probable acute on chronic diastolic CHF -CT angiogram negative for PE -BNP elevated at 1858, and patient has history of chronic diastolic CHF -Will give dose of IV Lasix, monitor fluid status and diuresis when necessary -Hep-Lock IVF 2. Hypotension -Resolved off BP meds, follow 3. Pseudomonas urinary tract infection -continue Rocephin pending sensitivities -Follow urine cultures and further treat accordingly 4. Hyperkalemia -Resolved off spironolactone and ACE inhibitor- Continue holding for now. 5. History of Hypertension -ACE held secondary to hypotension as above, follow and resume when clinically appropriate 6. Patellar fracture -Per primary team, status post surgical repair 7.CAD -She is chest pain free, follow resume ACE inhibitor, beta blocker, spironolactone when clinically appropriate Code Status: Full Family Communication: Family at bedside Disposition Plan: Per primary team    Antibiotics:  Rocephin started 3/5  HPI/Subjective: Doing well today, denies CP and no SOB  Objective: Filed Vitals:   09/17/13 1521  BP: 131/68  Pulse: 99  Temp: 98.7 F (37.1 C)  Resp: 18    Intake/Output Summary (Last 24 hours) at 09/17/13 1916 Last data filed at 09/17/13 1300  Gross per 24 hour  Intake    480 ml  Output      0 ml  Net    480 ml   There were no vitals filed for this visit.  Exam:  General: alert & oriented x 3 In NAD Cardiovascular: RRR, nl S1 s2 Respiratory: Decreased breath sounds at the bases Abdomen: soft +BS NT/ND, no masses palpable Extremities: No cyanosis and no edema    Data Reviewed: Basic Metabolic Panel:  Recent Labs Lab 09/14/13 1327 09/15/13 1638 09/15/13 1727 09/16/13 0520  NA 143  --  137 143  K 5.0  --  4.7 4.3  CL  --   --  101 109  CO2  --   --  20 23  GLUCOSE 99  --   103* 103*  BUN  --   --  29* 21  CREATININE  --   --  1.19* 1.01  CALCIUM  --   --  8.4 8.5  MG  --  2.0  --   --    Liver Function Tests:  Recent Labs Lab 09/15/13 1727  AST 124*  ALT 48*  ALKPHOS 107  BILITOT 0.3  PROT 6.5  ALBUMIN 3.1*   No results found for this basename: LIPASE, AMYLASE,  in the last 168 hours No results found for this basename: AMMONIA,  in the last 168 hours CBC:  Recent Labs Lab 09/14/13 1327 09/15/13 1638 09/16/13 0520  WBC  --  8.6 5.2  HGB 9.9* 7.9* 8.0*  HCT 29.0* 26.5* 26.6*  MCV  --  80.3 80.9  PLT  --  154 150   Cardiac Enzymes:  Recent Labs Lab 09/15/13 1726 09/15/13 2245 09/16/13 0520  TROPONINI <0.30 <0.30 <0.30   BNP (last 3 results)  Recent Labs  09/15/13 1640  PROBNP 1858.0*   CBG:  Recent Labs Lab 09/15/13 1624  GLUCAP 88    Recent Results (from the past 240 hour(s))  URINE CULTURE     Status: None   Collection Time    09/16/13  6:48 AM      Result Value Ref Range Status   Specimen Description URINE, RANDOM   Final   Special Requests NONE   Final   Culture  Setup Time  Final   Value: 09/16/2013 14:55     Performed at [place]     Final   Value: 50,000 COLONIES/ML     Performed at [facility]   Culture     Final   Value: PSEUDOMONAS AERUGINOSA     Performed at [facility]   Report Status PENDING   Incomplete     Studies: Ct Angio Chest Pe W/cm &/or Wo Cm  09/15/2013   CLINICAL DATA:  Hypoxia. Syncope. Postop from right knee surgery. Clinical suspicion for pulmonary embolism.  EXAM: CT ANGIOGRAPHY CHEST WITH CONTRAST  TECHNIQUE: Multidetector CT imaging of the chest was performed using the standard protocol during bolus administration of intravenous contrast. Multiplanar CT image reconstructions and MIPs were obtained to evaluate the vascular anatomy.  CONTRAST:  52mL OMNIPAQUE IOHEXOL 350 MG/ML SOLN  COMPARISON:  06/20/2008  FINDINGS: Satisfactory  opacification of pulmonary arteries noted, and no pulmonary emboli identified. No evidence of thoracic aortic dissection or aneurysm. No evidence of mediastinal hematoma or mass. A moderate size hiatal hernia is noted. No hilar or mediastinal masses are identified. No adenopathy seen elsewhere within the thorax.  Mild cardiomegaly is noted. No evidence of pleural or pericardial effusion. Mild bibasilar atelectasis or scarring is noted. No evidence of pulmonary consolidation or mass. No central endobronchial lesion identified.  Review of the MIP images confirms the above findings.  IMPRESSION: No evidence of pulmonary embolism.  Mild bibasilar atelectasis versus scarring. No evidence of pulmonary airspace disease or mass.  Moderate size hiatal hernia.  Mild cardiomegaly.   Electronically Signed   By: Earle Gell M.D.   On: 09/15/2013 19:39    Scheduled Meds: . aspirin EC  325 mg Oral Q breakfast  . baclofen  10 mg Oral TID  . cefTRIAXone (ROCEPHIN)  IV  1 g Intravenous Q24H  . docusate sodium  100 mg Oral BID  . ferrous sulfate  325 mg Oral Q breakfast  . gabapentin  600 mg Oral TID  . pantoprazole  40 mg Oral Daily  . sertraline  50 mg Oral Daily  . simvastatin  40 mg Oral QHS  . traZODone  50 mg Oral QHS   Continuous Infusions: . sodium chloride 100 mL/hr at 09/16/13 2694    Principal Problem:   Patella fracture Active Problems:   DEPRESSION   HYPERTENSION   CORONARY ARTERY DISEASE   GERD   Hypoxia   Lethargy   Hypotension, unspecified   Dehydration    Time spent:>35MINS    [facility] Pager [phone]. If 7PM-7AM, please contact night-coverage at [email or web address], password [facility] 09/17/2013, 7:16 PM  LOS: 3 days

## 2013-09-17 NOTE — Progress Notes (Signed)
Occupational Therapy Treatment Patient Details Name: [name] Clerk MRN: [id number] DOB: 16-Swingler-1931 Today's Date: 09/17/2013 Time: 2841-3244 OT Time Calculation (min): 29 min  OT Assessment / Plan / Recommendation  History of present illness [name] is an 80 year old female who underwent right total knee revision 2 years ago. She did very well with that. She had a fall in December and had significant knee pain. Plain radiographs were negative. She has had continued symptoms and notes some deformity around the patella. At the time of her revision she had the patellar button removed but she had a shallow patella left. Subsequent CT scan shows displaced comminuted fracture of the patella. She reports continued pain and wants to undergo operative treatment of this knee despite the inherent risk which are discussed fully with her.  She underwent patellectomy.   OT comments  This 80 yo making progress from yesterday; however not making progress towards original goals due to bad day yesterday and night before. Will continue to benefit from acute OT.  Follow Up Recommendations  Home health OT;Supervision/Assistance - 24 hour       Equipment Recommendations  None recommended by OT       Frequency Min 2X/week   Progress towards OT Goals Progress towards OT goals:  (progressing since yesterday, but not since eval)  Plan Discharge plan remains appropriate    Precautions / Restrictions Precautions Precautions: Fall;Knee Required Braces or Orthoses: Knee Immobilizer - Right Knee Immobilizer - Right: On at all times (per MD note 09/17/13 can have off in bed) Restrictions Weight Bearing Restrictions: Yes RLE Weight Bearing: Weight bearing as tolerated (in [place])   Pertinent Vitals/Pain 8/10 RLE; RN aware and in to give meds while I was in room with patient    ADL  Toilet Transfer: Minimal assistance Toilet Transfer Method: Sit to stand Toilet Transfer Equipment: Bedside commode Toileting - Clothing  Manipulation and Hygiene: Minimal assistance Where Assessed - Best boy and Hygiene: Sit to stand from 3-in-1 or toilet Equipment Used: Rolling walker;Knee Immobilizer Transfers/Ambulation Related to ADLs: Min A stand pivot (due to increased pain RLE) ADL Comments: No family here today due to daughter that stays with her most of time has stomach bug and grand-daughters cannot get here until later.      OT Goals(current goals can now be found in the care plan section)    Visit Information  Last OT Received On: 09/17/13 Assistance Needed: +1 History of Present Illness: [name] is an 80 year old female who underwent right total knee revision 2 years ago. She did very well with that. She had a fall in December and had significant knee pain. Plain radiographs were negative. She has had continued symptoms and notes some deformity around the patella. At the time of her revision she had the patellar button removed but she had a shallow patella left. Subsequent CT scan shows displaced comminuted fracture of the patella. She reports continued pain and wants to undergo operative treatment of this knee despite the inherent risk which are discussed fully with her.  She underwent patellectomy.          Cognition  Cognition Arousal/Alertness: Awake/alert Behavior During Therapy: WFL for tasks assessed/performed Overall Cognitive Status: Within Functional Limits for tasks assessed    Mobility  Bed Mobility Overal bed mobility: Needs Assistance Bed Mobility: Supine to Sit Supine to sit: Min assist (for RLE--almost totally had it by herself.) Transfers Overall transfer level: Needs assistance Equipment used: Rolling walker (2 wheeled) Transfers: Sit to/from Stand Sit  to Stand: Min assist General transfer comment: VCs for hand placement       Balance Balance Standing balance support: Single extremity supported;During functional activity Standing balance-Leahy Scale: Poor   End of Session OT - End of Session Equipment Utilized During Treatment: Rolling walker;Right knee immobilizer Activity Tolerance: Patient limited by pain Patient left: in chair;with call bell/phone within reach       Almon Register [phone] 09/17/2013, 10:17 AM

## 2013-09-17 NOTE — Progress Notes (Signed)
Subjective: Pt stable this am - could ambulate in room - wants to dc to home   Objective: Vital signs in last 24 hours: Temp:  [97.4 F (36.3 C)-99.2 F (37.3 C)] 97.4 F (36.3 C) (03/06 0502) Pulse Rate:  [78-93] 80 (03/06 0502) Resp:  [16-18] 16 (03/06 0502) BP: (125-156)/(51-60) 125/59 mmHg (03/06 0502) SpO2:  [95 %-100 %] 100 % (03/06 0502)  Intake/Output from previous day: 03/05 0701 - 03/06 0700 In: 360 [P.O.:360] Out: 350 [Urine:350] Intake/Output this shift:    Exam:  incision ok - dressing changed  Labs:  Recent Labs  09/14/13 1327 09/15/13 1638 09/16/13 0520  HGB 9.9* 7.9* 8.0*    Recent Labs  09/15/13 1638 09/16/13 0520  WBC 8.6 5.2  RBC 3.30* 3.29*  HCT 26.5* 26.6*  PLT 154 150    Recent Labs  09/15/13 1727 09/16/13 0520  NA 137 143  K 4.7 4.3  CL 101 109  CO2 20 23  BUN 29* 21  CREATININE 1.19* 1.01  GLUCOSE 103* 103*  CALCIUM 8.4 8.5   No results found for this basename: LABPT, INR,  in the last 72 hours  Assessment/Plan: Pt slowly improving - still not very mobile - ok for knee immobilizer to be off at bedtime and in bed - use for walking [place] need to stay until Monday   [name] SCOTT 09/17/2013, 8:21 AM

## 2013-09-17 NOTE — Progress Notes (Signed)
Physical Therapy Treatment Patient Details Name: [name] MRN: [id number] DOB: [birth year] Today's Date: 09/17/2013 Time: 4132-4401 PT Time Calculation (min): 18 min  PT Assessment / Plan / Recommendation  History of Present Illness [name] is an 80 year old female who underwent right total knee revision 2 years ago. She did very well with that. She had a fall in December and had significant knee pain. Plain radiographs were negative. She has had continued symptoms and notes some deformity around the patella. At the time of her revision she had the patellar button removed but she had a shallow patella left. Subsequent CT scan shows displaced comminuted fracture of the patella. She reports continued pain and wants to undergo operative treatment of this knee despite the inherent risk which are discussed fully with her.  She underwent patellectomy.   PT Comments   Pt increased ambulatory distance today to 35 feet with Min A. She is progressing well towards her goals and gaining independence with her functional mobility. Pt will benefit from continued acute PT services.  Follow Up Recommendations  Home health PT;Supervision for mobility/OOB     Does the patient have the potential to tolerate intense rehabilitation     Barriers to Discharge        Equipment Recommendations  None recommended by PT    Recommendations for Other Services    Frequency Min 5X/week   Progress towards PT Goals Progress towards PT goals: Progressing toward goals  Plan Current plan remains appropriate    Precautions / Restrictions Precautions Precautions: Fall;Knee Required Braces or Orthoses: Knee Immobilizer - Right Knee Immobilizer - Right: On at all times Restrictions Weight Bearing Restrictions: Yes RLE Weight Bearing: Weight bearing as tolerated   Pertinent Vitals/Pain Pain 8/10 Nurse notified Pt repositioned for comfort in bed    Mobility  Bed Mobility Overal bed mobility: Needs Assistance Bed  Mobility: Sit to Supine Sidelying to sit: Min assist General bed mobility comments: Pt requires Min assist for supporting RLE into bed Transfers Overall transfer level: Needs assistance Equipment used: Rolling walker (2 wheeled) Transfers: Sit to/from Stand Sit to Stand: Min assist General transfer comment: Sit<>stand with Min A from bedside toilet, verbal cues for hand placement Ambulation/Gait Ambulation/Gait assistance: Min assist Ambulation Distance (Feet): 35 Feet Assistive device: Rolling walker (2 wheeled) Gait Pattern/deviations: Step-to pattern;Decreased step length - left;Decreased stance time - right;Trunk flexed Gait velocity: decreased General Gait Details: Knee immobilizer in place and prevented buckling. Needs Min assist for controlling RW. Frequent verbal cues for sequencing.    Exercises     PT Diagnosis:    PT Problem List:   PT Treatment Interventions:     PT Goals (current goals can now be found in the care plan section) Acute Rehab PT Goals PT Goal Formulation: With patient Time For Goal Achievement: 09/22/13 Potential to Achieve Goals: Good  Visit Information  Last PT Received On: 09/17/13 Assistance Needed: +1 History of Present Illness: [name] is an 80 year old female who underwent right total knee revision 2 years ago. She did very well with that. She had a fall in December and had significant knee pain. Plain radiographs were negative. She has had continued symptoms and notes some deformity around the patella. At the time of her revision she had the patellar button removed but she had a shallow patella left. Subsequent CT scan shows displaced comminuted fracture of the patella. She reports continued pain and wants to undergo operative treatment of this knee despite the inherent risk which are discussed  fully with her.  She underwent patellectomy.    Subjective Data  Subjective: "I feel a bit better today, ready to get back in bed though"   Cognition   Cognition Arousal/Alertness: Awake/alert Behavior During Therapy: WFL for tasks assessed/performed Overall Cognitive Status: Within Functional Limits for tasks assessed    Balance     End of Session PT - End of Session Equipment Utilized During Treatment: Gait belt;Right knee immobilizer Activity Tolerance: Patient limited by fatigue Patient left: in bed;with call bell/phone within reach Nurse Communication: Patient requests pain meds;Mobility status   GP     [place], [place]  [name] 09/17/2013, 3:53 PM

## 2013-09-18 DIAGNOSIS — R0602 Shortness of breath: Secondary | ICD-10-CM

## 2013-09-18 DIAGNOSIS — S82009A Unspecified fracture of unspecified patella, initial encounter for closed fracture: Principal | ICD-10-CM

## 2013-09-18 LAB — URINE CULTURE: Colony Count: 50000

## 2013-09-18 MED ORDER — BENAZEPRIL HCL 20 MG PO TABS
20.0000 mg | ORAL_TABLET | Freq: Every day | ORAL | Status: DC
  Administered 2013-09-19 – 2013-09-20 (×2): 20 mg via ORAL
  Filled 2013-09-18 (×3): qty 1

## 2013-09-18 MED ORDER — PHENAZOPYRIDINE HCL 200 MG PO TABS
200.0000 mg | ORAL_TABLET | Freq: Three times a day (TID) | ORAL | Status: DC
  Administered 2013-09-18 – 2013-09-20 (×5): 200 mg via ORAL
  Filled 2013-09-18 (×8): qty 1

## 2013-09-18 MED ORDER — BISACODYL 5 MG PO TBEC: 10.0000 mg | DELAYED_RELEASE_TABLET | Freq: Every day | ORAL | Status: DC | PRN

## 2013-09-18 MED ORDER — BENAZEPRIL HCL 40 MG PO TABS: 20.0000 mg | ORAL_TABLET | Freq: Every day | ORAL | Status: DC

## 2013-09-18 MED ORDER — METOPROLOL TARTRATE 50 MG PO TABS
50.0000 mg | ORAL_TABLET | Freq: Two times a day (BID) | ORAL | Status: DC
  Administered 2013-09-19 – 2013-09-20 (×3): 50 mg via ORAL
  Filled 2013-09-18 (×5): qty 1

## 2013-09-18 MED ORDER — CIPROFLOXACIN HCL 500 MG PO TABS
500.0000 mg | ORAL_TABLET | Freq: Every day | ORAL | Status: DC
  Administered 2013-09-18 – 2013-09-20 (×3): 500 mg via ORAL
  Filled 2013-09-18 (×3): qty 1

## 2013-09-18 MED ORDER — CIPROFLOXACIN HCL 500 MG PO TABS: 500.0000 mg | ORAL_TABLET | Freq: Every day | ORAL | Status: DC

## 2013-09-18 MED ORDER — PHENAZOPYRIDINE HCL 200 MG PO TABS: 200.0000 mg | ORAL_TABLET | Freq: Three times a day (TID) | ORAL | Status: DC

## 2013-09-18 NOTE — Progress Notes (Signed)
Occupational Therapy Treatment Patient Details Name: [name] MRN: [id number] DOB: 1929-11-19 Today's Date: 09/18/2013 Time: 6063-0160 OT Time Calculation (min): 54 min  OT Assessment / Plan / Recommendation  History of present illness [name] is an 80 year old female who underwent right total knee revision 2 years ago. She did very well with that. She had a fall in December and had significant knee pain. Plain radiographs were negative. She has had continued symptoms and notes some deformity around the patella. At the time of her revision she had the patellar button removed but she had a shallow patella left. Subsequent CT scan shows displaced comminuted fracture of the patella. She reports continued pain and wants to undergo operative treatment of this knee despite the inherent risk which are discussed fully with her.  She underwent patellectomy.   OT comments  Pt. And family are in agreement with CIR for rehab. Pt. Would benefit from [facility] vs. [facility] for rehab. Pt. Ed. On use of AE for LE ADLs. Pt. And dtr. Ed. On safe sit to stand and stand to sit from chair, commode, and bed. Pt. Nurse placed order for SW to place order for inpt. Rehab.   Follow Up Recommendations  CIR;SNF    Barriers to Discharge       Equipment Recommendations  None recommended by OT    Recommendations for Other Services    Frequency Min 2X/week   Progress towards OT Goals Progress towards OT goals: Progressing toward goals  Plan Discharge plan needs to be updated    Precautions / Restrictions Precautions Precautions: Fall;Knee Required Braces or Orthoses: Knee Immobilizer - Right Knee Immobilizer - Right: On at all times Restrictions Weight Bearing Restrictions: Yes RLE Weight Bearing: Weight bearing as tolerated   Pertinent Vitals/Pain None stated    ADL  Grooming: Performed;Wash/dry hands;Wash/dry face;Min guard Where Assessed - Grooming: Supported standing Lower Body Dressing: Moderate  assistance Where Assessed - Lower Body Dressing: Unsupported sitting (use of AE) Toilet Transfer: Performed;Minimal assistance Toilet Transfer Method: Stand pivot Toilet Transfer Equipment: Comfort height toilet;Grab bars Toileting - Clothing Manipulation and Hygiene: Minimal assistance Where Assessed - Toileting Clothing Manipulation and Hygiene: Standing ADL Comments: Pt. dtr ed. on AE  (Pt. dtr. ed. on use of AE and safe transfers to commode. )    OT Diagnosis:    OT Problem List:   OT Treatment Interventions:     OT Goals(current goals can now be found in the care plan section) Acute Rehab OT Goals Patient Stated Goal: get back to traveling OT Goal Formulation: With patient  Visit Information  Last OT Received On: 09/18/13 Assistance Needed: +1 History of Present Illness: [name] is an 80 year old female who underwent right total knee revision 2 years ago. She did very well with that. She had a fall in December and had significant knee pain. Plain radiographs were negative. She has had continued symptoms and notes some deformity around the patella. At the time of her revision she had the patellar button removed but she had a shallow patella left. Subsequent CT scan shows displaced comminuted fracture of the patella. She reports continued pain and wants to undergo operative treatment of this knee despite the inherent risk which are discussed fully with her.  She underwent patellectomy.    Subjective Data      Prior Functioning       Cognition  Cognition Arousal/Alertness: Awake/alert Behavior During Therapy: WFL for tasks assessed/performed Overall Cognitive Status: Within Functional Limits for tasks assessed  Mobility  Bed Mobility Overal bed mobility: Needs Assistance Supine to sit: Min guard;HOB elevated Sit to supine: Min assist General bed mobility comments: cues on technique with head of bed elevated approx 30 degrees. Transfers Overall transfer level: Needs  assistance Equipment used: Rolling walker (2 wheeled) Transfers: Sit to/from Stand Sit to Stand: Supervision;Mod assist General transfer comment: supervision with sit<>stand to/from bed and recliner with cues on hand and RLE placmement. mod assist needed only with standing from toilet with one hand rail on wall and walker used.    Exercises      Balance    End of Session OT - End of Session Equipment Utilized During Treatment: Rolling walker;Right knee immobilizer Activity Tolerance: Patient tolerated treatment well Patient left: in bed;with call bell/phone within reach;with family/visitor present  [place] 09/18/2013, 4:00 PM

## 2013-09-18 NOTE — Progress Notes (Signed)
Patient ID: [name], female   DOB: 09-Oct-1929, 80 y.o.   MRN: [id number] Patient is an 80 year old woman status post open reduction internal fixation patella fracture. Patient is slow to mobilize possibly discharge to home on Sunday.

## 2013-09-18 NOTE — Progress Notes (Signed)
[facility] PROGRESS NOTE  [name] UKG:[id number] DOB: 11/09/1929 DOA: 09/14/2013 PCP: Nyoka Cowden, MD  Assessment/Plan: 1.Hypoxia/probable acute on chronic diastolic CHF -CT angiogram negative for PE -BNP elevated at 1858, and patient has history of chronic diastolic CHF -Status post IV Lasix x1 this hospital stay and has not required any further diuresis. -Hep-Lock IVF 2. Hypotension -Resolved off BP meds, follow 3. Pseudomonas urinary tract infection -continue Rocephin pending sensitivities -Follow urine cultures and further treat accordingly 4. Hyperkalemia -Resolved off spironolactone and ACE inhibitor- Continue holding for now. 5. History of Hypertension -ACE held secondary to hypotension as above, follow and resume when clinically appropriate 6. Patellar fracture -Per primary team, status post surgical repair 7.CAD -She is chest pain free, will resume ACE inhibitor and beta blocker as blood pressures remaining stable.   Patient medically ready for discharge  IM standpoint when okay per primary. She is to follow up with her PCP  Code Status: Full Family Communication: Family at bedside Disposition Plan: Per primary team planning discharge tomorrow    Antibiotics:  Rocephin started 3/5>> 3/7  Cipro started 3/7 >>  HPI/Subjective: Doing well today, denies CP and no SOB  Objective: Filed Vitals:   09/18/13 1317  BP: 127/67  Pulse: 98  Temp: 98.4 F (36.9 C)  Resp: 20    Intake/Output Summary (Last 24 hours) at 09/18/13 1823 Last data filed at 09/18/13 1200  Gross per 24 hour  Intake    240 ml  Output      0 ml  Net    240 ml   There were no vitals filed for this visit.  Exam:  General: alert & oriented x 3 In NAD Cardiovascular: RRR, nl S1 s2 Respiratory: Decreased breath sounds at the bases Abdomen: soft +BS NT/ND, no masses palpable Extremities: No cyanosis and no edema    Data Reviewed: Basic Metabolic Panel:  Recent  Labs Lab 09/14/13 1327 09/15/13 1638 09/15/13 1727 09/16/13 0520  NA 143  --  137 143  K 5.0  --  4.7 4.3  CL  --   --  101 109  CO2  --   --  20 23  GLUCOSE 99  --  103* 103*  BUN  --   --  29* 21  CREATININE  --   --  1.19* 1.01  CALCIUM  --   --  8.4 8.5  MG  --  2.0  --   --    Liver Function Tests:  Recent Labs Lab 09/15/13 1727  AST 124*  ALT 48*  ALKPHOS 107  BILITOT 0.3  PROT 6.5  ALBUMIN 3.1*   No results found for this basename: LIPASE, AMYLASE,  in the last 168 hours No results found for this basename: AMMONIA,  in the last 168 hours CBC:  Recent Labs Lab 09/14/13 1327 09/15/13 1638 09/16/13 0520  WBC  --  8.6 5.2  HGB 9.9* 7.9* 8.0*  HCT 29.0* 26.5* 26.6*  MCV  --  80.3 80.9  PLT  --  154 150   Cardiac Enzymes:  Recent Labs Lab 09/15/13 1726 09/15/13 2245 09/16/13 0520  TROPONINI <0.30 <0.30 <0.30   BNP (last 3 results)  Recent Labs  09/15/13 1640  PROBNP 1858.0*   CBG:  Recent Labs Lab 09/15/13 1624  GLUCAP 88    Recent Results (from the past 240 hour(s))  URINE CULTURE     Status: None   Collection Time    09/16/13  6:48 AM  Result Value Ref Range Status   Specimen Description URINE, RANDOM   Final   Special Requests NONE   Final   Culture  Setup Time     Final   Value: 09/16/2013 14:55     Performed at [place]     Final   Value: 50,000 COLONIES/ML     Performed at [facility]   Culture     Final   Value: PSEUDOMONAS AERUGINOSA     Performed at [facility]   Report Status 09/18/2013 FINAL   Final   Organism ID, Bacteria PSEUDOMONAS AERUGINOSA   Final     Studies: No results found.  Scheduled Meds: . aspirin EC  325 mg Oral Q breakfast  . baclofen  10 mg Oral TID  . ciprofloxacin  500 mg Oral Daily  . docusate sodium  100 mg Oral BID  . ferrous sulfate  325 mg Oral Q breakfast  . gabapentin  600 mg Oral TID  . pantoprazole  40 mg Oral Daily  .  phenazopyridine  200 mg Oral TID WC  . sertraline  50 mg Oral Daily  . simvastatin  40 mg Oral QHS  . traZODone  50 mg Oral QHS   Continuous Infusions: . sodium chloride 100 mL/hr at 09/16/13 6389    Principal Problem:   Patella fracture Active Problems:   DEPRESSION   HYPERTENSION   CORONARY ARTERY DISEASE   GERD   Hypoxia   Lethargy   Hypotension, unspecified   Dehydration    Time spent:25MINS    [facility] Pager [phone]. If 7PM-7AM, please contact night-coverage at [email or web address], password [facility] 09/18/2013, 6:23 PM  LOS: 4 days

## 2013-09-18 NOTE — Progress Notes (Signed)
Physical Therapy Treatment Patient Details Name: [name] MRN: [id number] DOB: 02-04-1930 Today's Date: 09/18/2013 Time: 0737-1062 PT Time Calculation (min): 34 min  PT Assessment / Plan / Recommendation  History of Present Illness [name] is an 80 year old female who underwent right total knee revision 2 years ago. She did very well with that. She had a fall in December and had significant knee pain. Plain radiographs were negative. She has had continued symptoms and notes some deformity around the patella. At the time of her revision she had the patellar button removed but she had a shallow patella left. Subsequent CT scan shows displaced comminuted fracture of the patella. She reports continued pain and wants to undergo operative treatment of this knee despite the inherent risk which are discussed fully with her.  She underwent patellectomy.   PT Comments   Pt making steady progress with mobility. Pt able to stand with supervision (for safety only) and complete peri care and wash hands. Son expressed concerns on pt going home and now wishes pt to go to rehab for "a couple of days" before going home. Pt's daughter will be staying with her on discharge. She works from noon until 8 pm, during which both pt and son said other family members/friends will be available to check on patient. RN notified of son's request and will follow up with CM and SW to speak with pt and family.    Follow Up Recommendations  Home health PT;Supervision for mobility/OOB     Equipment Recommendations  None recommended by PT       Frequency Min 5X/week   Progress towards PT Goals Progress towards PT goals: Progressing toward goals  Plan Current plan remains appropriate    Precautions / Restrictions Precautions Precautions: Fall;Knee Required Braces or Orthoses: Knee Immobilizer - Right Knee Immobilizer - Right: On at all times Restrictions RLE Weight Bearing: Weight bearing as tolerated       Mobility  Bed Mobility Overal bed mobility: Needs Assistance Supine to sit: Min guard;HOB elevated General bed mobility comments: cues on technique with head of bed elevated approx 30 degrees. Transfers Overall transfer level: Needs assistance Equipment used: Rolling walker (2 wheeled) Transfers: Sit to/from Stand Sit to Stand: Supervision;Mod assist General transfer comment: supervision with sit<>stand to/from bed and recliner with cues on hand and RLE placmement. mod assist needed only with standing from toilet with one hand rail on wall and walker used. Ambulation/Gait Ambulation/Gait assistance: Min guard Ambulation Distance (Feet): 30 Feet Assistive device: Rolling walker (2 wheeled) Gait Pattern/deviations: Step-to pattern;Antalgic;Trunk flexed;Decreased stance time - right;Decreased step length - left Gait velocity: decreased Gait velocity interpretation: Below normal speed for age/gender General Gait Details: Knee immobilizer in place and prevented buckling. cues on posture and walker position with gait.      PT Goals (current goals can now be found in the care plan section) Acute Rehab PT Goals Patient Stated Goal: get back to traveling PT Goal Formulation: With patient Time For Goal Achievement: 09/22/13 Potential to Achieve Goals: Good  Visit Information  Last PT Received On: 09/18/13 Assistance Needed: +1 History of Present Illness: [name] is an 80 year old female who underwent right total knee revision 2 years ago. She did very well with that. She had a fall in December and had significant knee pain. Plain radiographs were negative. She has had continued symptoms and notes some deformity around the patella. At the time of her revision she had the patellar button removed but she had a shallow patella  left. Subsequent CT scan shows displaced comminuted fracture of the patella. She reports continued pain and wants to undergo operative treatment of this knee despite the inherent risk  which are discussed fully with her.  She underwent patellectomy.    Subjective Data  Patient Stated Goal: get back to traveling   Cognition  Cognition Arousal/Alertness: Awake/alert Behavior During Therapy: WFL for tasks assessed/performed Overall Cognitive Status: Within Functional Limits for tasks assessed       End of Session PT - End of Session Equipment Utilized During Treatment: Gait belt;Right knee immobilizer Activity Tolerance: Patient tolerated treatment well Patient left: in chair;with family/visitor present;with call bell/phone within reach Nurse Communication: Mobility status;Other (comment) (son requesting rehab now)   GP     Willow Ora 09/18/2013, 3:41 PM  Willow Ora, PTA Office- [phone]

## 2013-09-19 LAB — BASIC METABOLIC PANEL
BUN: 17 mg/dL (ref 6–23)
CO2: 27 mEq/L (ref 19–32)
Calcium: 8.7 mg/dL (ref 8.4–10.5)
Chloride: 99 mEq/L (ref 96–112)
Creatinine, Ser: 0.95 mg/dL (ref 0.50–1.10)
GFR calc Af Amer: 63 mL/min — ABNORMAL LOW (ref >90)
GFR calc non Af Amer: 55 mL/min — ABNORMAL LOW (ref >90)
Glucose, Bld: 103 mg/dL — ABNORMAL HIGH (ref 70–99)
Potassium: 3.9 mEq/L (ref 3.7–5.3)
Sodium: 138 mEq/L (ref 137–147)

## 2013-09-19 NOTE — Progress Notes (Signed)
Patient ID: [name], female   DOB: [birth year], 80 y.o.   MRN: [id number] Patient is slow to progress with physical therapy. Her family states that they will be unable to care for her at home. They requested rehabilitation placement either inpatient or outpatient. Orders were written.

## 2013-09-19 NOTE — Progress Notes (Signed)
[facility] PROGRESS NOTE  [name] QQV:[id number] DOB: May 22, 1930 DOA: 09/14/2013 PCP: Nyoka Cowden, MD  Assessment/Plan: 1.Hypoxia/probable acute on chronic diastolic CHF -CT angiogram negative for PE -BNP elevated at 1858, and patient has history of chronic diastolic CHF -Status post IV Lasix x1 this hospital stay and has not required any further diuresis. 2. Hypotension -Resolved off BP meds, follow 3. Pseudomonas urinary tract infection -continue cipro started on 3/7 4. Hyperkalemia -Resolved off spironolactone and ACE inhibitor -ACEi resumed and k remaining stable/wnl 5. History of Hypertension -ACE held secondary to hypotension as above on admission, now resumed- follow  6. Patellar fracture -Per primary team, status post surgical repair 7.CAD -She is chest pain free, continue ACE inhibitor and beta blocker as blood pressures remaining stable.   Patient medically ready for discharge  IM standpoint when okay per primary. She is to follow up with her PCP  Code Status: Full Family Communication: Family at bedside Disposition Plan: Per primary team planning discharge tomorrow    Antibiotics:  Rocephin started 3/5>> 3/7  Cipro started 3/7 >>  HPI/Subjective: Doing well today, denies CP and no SOB  Objective: Filed Vitals:   09/19/13 1335  BP: 113/46  Pulse: 66  Temp: 97.8 F (36.6 C)  Resp: 16    Intake/Output Summary (Last 24 hours) at 09/19/13 1814 Last data filed at 09/19/13 0556  Gross per 24 hour  Intake    240 ml  Output      0 ml  Net    240 ml   There were no vitals filed for this visit.  Exam:  General: alert & oriented x 3 In NAD Cardiovascular: RRR, nl S1 s2 Respiratory: Decreased breath sounds at the bases Abdomen: soft +BS NT/ND, no masses palpable Extremities: No cyanosis and no edema    Data Reviewed: Basic Metabolic Panel:  Recent Labs Lab 09/14/13 1327 09/15/13 1638 09/15/13 1727 09/16/13 0520  09/19/13 0605  NA 143  --  137 143 138  K 5.0  --  4.7 4.3 3.9  CL  --   --  101 109 99  CO2  --   --  20 23 27   GLUCOSE 99  --  103* 103* 103*  BUN  --   --  29* 21 17  CREATININE  --   --  1.19* 1.01 0.95  CALCIUM  --   --  8.4 8.5 8.7  MG  --  2.0  --   --   --    Liver Function Tests:  Recent Labs Lab 09/15/13 1727  AST 124*  ALT 48*  ALKPHOS 107  BILITOT 0.3  PROT 6.5  ALBUMIN 3.1*   No results found for this basename: LIPASE, AMYLASE,  in the last 168 hours No results found for this basename: AMMONIA,  in the last 168 hours CBC:  Recent Labs Lab 09/14/13 1327 09/15/13 1638 09/16/13 0520  WBC  --  8.6 5.2  HGB 9.9* 7.9* 8.0*  HCT 29.0* 26.5* 26.6*  MCV  --  80.3 80.9  PLT  --  154 150   Cardiac Enzymes:  Recent Labs Lab 09/15/13 1726 09/15/13 2245 09/16/13 0520  TROPONINI <0.30 <0.30 <0.30   BNP (last 3 results)  Recent Labs  09/15/13 1640  PROBNP 1858.0*   CBG:  Recent Labs Lab 09/15/13 1624  GLUCAP 88    Recent Results (from the past 240 hour(s))  URINE CULTURE     Status: None   Collection Time  09/16/13  6:48 AM      Result Value Ref Range Status   Specimen Description URINE, RANDOM   Final   Special Requests NONE   Final   Culture  Setup Time     Final   Value: 09/16/2013 14:55     Performed at [facility]     Final   Value: 50,000 COLONIES/ML     Performed at [facility]   Culture     Final   Value: PSEUDOMONAS AERUGINOSA     Performed at [facility]   Report Status 09/18/2013 FINAL   Final   Organism ID, Bacteria PSEUDOMONAS AERUGINOSA   Final     Studies: No results found.  Scheduled Meds: . aspirin EC  325 mg Oral Q breakfast  . baclofen  10 mg Oral TID  . benazepril  20 mg Oral Daily  . ciprofloxacin  500 mg Oral Daily  . docusate sodium  100 mg Oral BID  . ferrous sulfate  325 mg Oral Q breakfast  . gabapentin  600 mg Oral TID  . metoprolol  50 mg Oral BID  .  pantoprazole  40 mg Oral Daily  . phenazopyridine  200 mg Oral TID WC  . sertraline  50 mg Oral Daily  . simvastatin  40 mg Oral QHS  . traZODone  50 mg Oral QHS   Continuous Infusions:    Principal Problem:   Patella fracture Active Problems:   DEPRESSION   HYPERTENSION   CORONARY ARTERY DISEASE   GERD   Hypoxia   Lethargy   Hypotension, unspecified   Dehydration    Time spent:25MINS    [facility] Pager [phone]. If 7PM-7AM, please contact night-coverage at [email or web address], password [facility] 09/19/2013, 6:14 PM  LOS: 5 days

## 2013-09-19 NOTE — Progress Notes (Signed)
Clinical Social Work Department CLINICAL SOCIAL WORK PLACEMENT NOTE 09/19/2013  Patient:  LAVENIA, STUMPO  Account Number:  [id number] Admit date:  09/14/2013  Clinical Social Worker:  Tilden Fossa, Latanya Presser  Date/time:  09/19/2013 05:49 PM  Clinical Social Work is seeking post-discharge placement for this patient at the following level of care:   SKILLED NURSING   (*CSW will update this form in Epic as items are completed)   09/19/2013  Patient/family provided with [facility] Department of Clinical Social Work's list of facilities offering this level of care within the geographic area requested by the patient (or if unable, by the patient's family).  09/19/2013  Patient/family informed of their freedom to choose among providers that offer the needed level of care, that participate in Medicare, Medicaid or managed care program needed by the patient, have an available bed and are willing to accept the patient.    Patient/family informed of [facility]' ownership interest in [facility], as well as of the fact that they are under no obligation to receive care at this facility.  PASARR submitted to EDS on 04/05/2011 PASARR number received from EDS on 04/05/2011  FL2 transmitted to all facilities in geographic area requested by pt/family on  09/19/2013 FL2 transmitted to all facilities within larger geographic area on   Patient informed that his/her managed care company has contracts with or will negotiate with  certain facilities, including the following:     Patient/family informed of bed offers received:   Patient chooses bed at  Physician recommends and patient chooses bed at    Patient to be transferred to  on   Patient to be transferred to facility by   The following physician request were entered in Epic:   Additional Comments:  Tilden Fossa, MSW, [place] Worker [facility]. [phone]

## 2013-09-19 NOTE — Progress Notes (Signed)
Clinical Social Work Department BRIEF PSYCHOSOCIAL ASSESSMENT 09/19/2013  Patient:  [name], [name]     Account Number:  [id number]     Admit date:  09/14/2013  Clinical Social Worker:  Rolinda Roan  Date/Time:  09/19/2013 06:12 PM  Referred by:  Physician  Date Referred:  09/19/2013 Referred for  SNF Placement   Other Referral:   Interview type:  Patient Other interview type:    PSYCHOSOCIAL DATA Living Status:  ALONE Admitted from facility:   Level of care:   Primary support name:  [name] [phone] Primary support relationship to patient:  CHILD, ADULT Degree of support available:   Very supportive at bedside.    CURRENT CONCERNS  Other Concerns:    SOCIAL WORK ASSESSMENT / PLAN Clinical Social Worker (CSW) received call from RN stating that PT is recommending home health however the patient and family want SNF. CSW met with patient and her daughter [name] at bedside. Patient reported that she is feeling much better today and believes she can go home. Daughter agreed that patient can probably go home with home health but they also want to do a SNF search in case patient feels worse on Monday. Daughter and patient agreeable to SNF search in [place]. Patient reported that she has been to [facility] before. CSW explained to patient and daughter that Mcarthur Rossetti will have to approve her SNF stay in order to cover it. Patient and daughter verbalized their understanding. Patient reported that she would make a decision between SNF and home health on Monday.   Assessment/plan status:  Psychosocial Support/Ongoing Assessment of Needs Other assessment/ plan:   Information/referral to community resources:   CSW gave patient SNF list.    PATIENT'S/FAMILY'S RESPONSE TO PLAN OF CARE: Patient and daughter thanked CSW for visiting and answering all their questions.

## 2013-09-20 MED ORDER — METOPROLOL TARTRATE 50 MG PO TABS: 50.0000 mg | ORAL_TABLET | Freq: Two times a day (BID) | ORAL | Status: DC

## 2013-09-20 MED ORDER — BISACODYL 5 MG PO TBEC: 10.0000 mg | DELAYED_RELEASE_TABLET | Freq: Every day | ORAL | Status: DC | PRN

## 2013-09-20 MED ORDER — HYDROCODONE-ACETAMINOPHEN 5-325 MG PO TABS: 1.0000 | ORAL_TABLET | Freq: Four times a day (QID) | ORAL | Status: DC | PRN

## 2013-09-20 NOTE — Discharge Summary (Signed)
Physician Discharge Summary  Patient ID: [name] MRN: [id number] DOB/AGE: Feb 05, 1930 80 y.o.  Admit date: 09/14/2013 Discharge date: 09/20/2013  Admission Diagnoses:  Principal Problem:   Patella fracture Active Problems:   DEPRESSION   HYPERTENSION   CORONARY ARTERY DISEASE   GERD   Hypoxia   Lethargy   Hypotension, unspecified   Dehydration   Discharge Diagnoses:  Same  Surgeries: Procedure(s): PATELLECTOMY on 09/14/2013   Consultants:    Discharged Condition: Stable  Hospital Course: [name] is an 80 y.o. female who was admitted 09/14/2013 with a chief complaint of right knee pain, and found to have a diagnosis of Patella fracture.  They were brought to the operating room on 09/14/2013 and underwent the above named procedures. Slow to mobilize with PT but did get around by pod 5. Will have 24 hour care at home   Antibiotics given:  Anti-infectives   Start     Dose/Rate Route Frequency Ordered Stop   09/18/13 1645  ciprofloxacin (CIPRO) tablet 500 mg     500 mg Oral Daily 09/18/13 1637     09/18/13 0000  ciprofloxacin (CIPRO) 500 MG tablet     500 mg Oral Daily 09/18/13 1833     09/16/13 1700  cefTRIAXone (ROCEPHIN) 1 g in dextrose 5 % 50 mL IVPB  Status:  Discontinued     1 g 100 mL/hr over 30 Minutes Intravenous Every 24 hours 09/16/13 1635 09/18/13 1637   09/14/13 2330  ceFAZolin (ANCEF) IVPB 2 g/50 mL premix     2 g 100 mL/hr over 30 Minutes Intravenous 3 times per day 09/14/13 2129 09/15/13 0535   09/14/13 0600  ceFAZolin (ANCEF) IVPB 2 g/50 mL premix     2 g 100 mL/hr over 30 Minutes Intravenous On call to O.R. 09/13/13 1259 09/14/13 1730    .  Recent vital signs:  Filed Vitals:   09/20/13 0522  BP: 124/59  Pulse: 61  Temp: 97.9 F (36.6 C)  Resp: 18    Recent laboratory studies:  Results for orders placed during the hospital encounter of 09/14/13  URINE CULTURE      Result Value Ref Range   Specimen Description URINE, RANDOM     Special  Requests NONE     Culture  Setup Time       Value: 09/16/2013 14:55     Performed at [facility]       Value: 50,000 COLONIES/ML     Performed at [facility]   Culture       Value: PSEUDOMONAS AERUGINOSA     Performed at [facility]   Report Status 09/18/2013 FINAL     Organism ID, Bacteria PSEUDOMONAS AERUGINOSA    GLUCOSE, CAPILLARY      Result Value Ref Range   Glucose-Capillary 88  70 - 99 mg/dL  CBC      Result Value Ref Range   WBC 8.6  4.0 - 10.5 K/uL   RBC 3.30 (*) 3.87 - 5.11 MIL/uL   Hemoglobin 7.9 (*) 12.0 - 15.0 g/dL   HCT 26.5 (*) 36.0 - 46.0 %   MCV 80.3  78.0 - 100.0 fL   MCH 23.9 (*) 26.0 - 34.0 pg   MCHC 29.8 (*) 30.0 - 36.0 g/dL   RDW 15.9 (*) 11.5 - 15.5 %   Platelets 154  150 - 400 K/uL  MAGNESIUM      Result Value Ref Range   Magnesium  2.0  1.5 - 2.5 mg/dL  URINALYSIS, ROUTINE W REFLEX MICROSCOPIC      Result Value Ref Range   Color, Urine YELLOW  YELLOW   APPearance CLOUDY (*) CLEAR   Specific Gravity, Urine 1.015  1.005 - 1.030   pH 5.5  5.0 - 8.0   Glucose, UA NEGATIVE  NEGATIVE mg/dL   Hgb urine dipstick SMALL (*) NEGATIVE   Bilirubin Urine NEGATIVE  NEGATIVE   Ketones, ur NEGATIVE  NEGATIVE mg/dL   Protein, ur NEGATIVE  NEGATIVE mg/dL   Urobilinogen, UA 0.2  0.0 - 1.0 mg/dL   Nitrite NEGATIVE  NEGATIVE   Leukocytes, UA MODERATE (*) NEGATIVE  PRO B NATRIURETIC PEPTIDE      Result Value Ref Range   Pro B Natriuretic peptide (BNP) 1858.0 (*) 0 - 450 pg/mL  D-DIMER, QUANTITATIVE      Result Value Ref Range   D-Dimer, Quant 2.36 (*) 0.00 - 0.48 ug/mL-FEU  TROPONIN I      Result Value Ref Range   Troponin I <0.30  <0.30 ng/mL  TROPONIN I      Result Value Ref Range   Troponin I <0.30  <0.30 ng/mL  TROPONIN I      Result Value Ref Range   Troponin I <0.30  <0.30 ng/mL  COMPREHENSIVE METABOLIC PANEL      Result Value Ref Range   Sodium 137  137 - 147 mEq/L   Potassium 4.7  3.7 - 5.3 mEq/L    Chloride 101  96 - 112 mEq/L   CO2 20  19 - 32 mEq/L   Glucose, Bld 103 (*) 70 - 99 mg/dL   BUN 29 (*) 6 - 23 mg/dL   Creatinine, Ser 1.19 (*) 0.50 - 1.10 mg/dL   Calcium 8.4  8.4 - 10.5 mg/dL   Total Protein 6.5  6.0 - 8.3 g/dL   Albumin 3.1 (*) 3.5 - 5.2 g/dL   AST 124 (*) 0 - 37 U/L   ALT 48 (*) 0 - 35 U/L   Alkaline Phosphatase 107  39 - 117 U/L   Total Bilirubin 0.3  0.3 - 1.2 mg/dL   GFR calc non Af Amer 42 (*) >90 mL/min   GFR calc Af Amer 48 (*) >90 mL/min  BASIC METABOLIC PANEL      Result Value Ref Range   Sodium 143  137 - 147 mEq/L   Potassium 4.3  3.7 - 5.3 mEq/L   Chloride 109  96 - 112 mEq/L   CO2 23  19 - 32 mEq/L   Glucose, Bld 103 (*) 70 - 99 mg/dL   BUN 21  6 - 23 mg/dL   Creatinine, Ser 1.01  0.50 - 1.10 mg/dL   Calcium 8.5  8.4 - 10.5 mg/dL   GFR calc non Af Amer 51 (*) >90 mL/min   GFR calc Af Amer 59 (*) >90 mL/min  CBC      Result Value Ref Range   WBC 5.2  4.0 - 10.5 K/uL   RBC 3.29 (*) 3.87 - 5.11 MIL/uL   Hemoglobin 8.0 (*) 12.0 - 15.0 g/dL   HCT 26.6 (*) 36.0 - 46.0 %   MCV 80.9  78.0 - 100.0 fL   MCH 24.3 (*) 26.0 - 34.0 pg   MCHC 30.1  30.0 - 36.0 g/dL   RDW 16.1 (*) 11.5 - 15.5 %   Platelets 150  150 - 400 K/uL  VITAMIN B12      Result Value Ref  Range   Vitamin B-12 1385 (*) 211 - 911 pg/mL  FOLATE      Result Value Ref Range   Folate >20.0    IRON AND TIBC      Result Value Ref Range   Iron 14 (*) 42 - 135 ug/dL   TIBC 266  250 - 470 ug/dL   Saturation Ratios 5 (*) 20 - 55 %   UIBC 252  125 - 400 ug/dL  FERRITIN      Result Value Ref Range   Ferritin 28  10 - 291 ng/mL  URINE MICROSCOPIC-ADD ON      Result Value Ref Range   Squamous Epithelial / LPF RARE  RARE   WBC, UA 21-50  <3 WBC/hpf   RBC / HPF 0-2  <3 RBC/hpf   Bacteria, UA RARE  RARE  BASIC METABOLIC PANEL      Result Value Ref Range   Sodium 138  137 - 147 mEq/L   Potassium 3.9  3.7 - 5.3 mEq/L   Chloride 99  96 - 112 mEq/L   CO2 27  19 - 32 mEq/L   Glucose, Bld  103 (*) 70 - 99 mg/dL   BUN 17  6 - 23 mg/dL   Creatinine, Ser 0.95  0.50 - 1.10 mg/dL   Calcium 8.7  8.4 - 10.5 mg/dL   GFR calc non Af Amer 55 (*) >90 mL/min   GFR calc Af Amer 63 (*) >90 mL/min  POCT I-STAT 4, (NA,K, GLUC, HGB,HCT)      Result Value Ref Range   Sodium 143  137 - 147 mEq/L   Potassium 5.0  3.7 - 5.3 mEq/L   Glucose, Bld 99  70 - 99 mg/dL   HCT 29.0 (*) 36.0 - 46.0 %   Hemoglobin 9.9 (*) 12.0 - 15.0 g/dL    Discharge Medications:     Medication List         aspirin EC 325 MG tablet  Take 1 tablet (325 mg total) by mouth daily.     baclofen 10 MG tablet  Commonly known as:  LIORESAL  Take 10 mg by mouth 3 (three) times daily.     benazepril 40 MG tablet  Commonly known as:  LOTENSIN  Take 0.5 tablets (20 mg total) by mouth daily.     bisacodyl 5 MG EC tablet  Commonly known as:  DULCOLAX  Take 2 tablets (10 mg total) by mouth daily as needed for moderate constipation.     ciprofloxacin 500 MG tablet  Commonly known as:  CIPRO  Take 1 tablet (500 mg total) by mouth daily.     ferrous sulfate 325 (65 FE) MG tablet  Take 325 mg by mouth daily with breakfast.     furosemide 20 MG tablet  Commonly known as:  LASIX  Take 1 tablet (20 mg total) by mouth daily as needed.     gabapentin 600 MG tablet  Commonly known as:  NEURONTIN  Take 1 tablet (600 mg total) by mouth 3 (three) times daily.     HYDROcodone-acetaminophen 5-325 MG per tablet  Commonly known as:  NORCO  Take 1-2 tablets by mouth every 6 (six) hours as needed for moderate pain.     LORazepam 0.5 MG tablet  Commonly known as:  ATIVAN  Take 0.5 mg by mouth every 8 (eight) hours as needed for anxiety.     metoprolol 50 MG tablet  Commonly known as:  LOPRESSOR  Take 1 tablet (  50 mg total) by mouth 2 (two) times daily.     metoprolol 50 MG tablet  Commonly known as:  LOPRESSOR  Take 1 tablet (50 mg total) by mouth 2 (two) times daily.     nitroGLYCERIN 0.4 MG SL tablet  Commonly  known as:  NITROSTAT  Place 0.4 mg under the tongue every 5 (five) minutes as needed for chest pain.     omeprazole 40 MG capsule  Commonly known as:  PRILOSEC  Take 1 capsule (40 mg total) by mouth daily.     phenazopyridine 200 MG tablet  Commonly known as:  PYRIDIUM  Take 1 tablet (200 mg total) by mouth 3 (three) times daily with meals.     sertraline 50 MG tablet  Commonly known as:  ZOLOFT  Take 1 tablet (50 mg total) by mouth daily.     simvastatin 40 MG tablet  Commonly known as:  ZOCOR  Take 1 tablet (40 mg total) by mouth at bedtime.     spironolactone 25 MG tablet  Commonly known as:  ALDACTONE  Take 1 tablet (25 mg total) by mouth daily.     traMADol 50 MG tablet  Commonly known as:  ULTRAM  Take by mouth every 6 (six) hours as needed for moderate pain.     traZODone 100 MG tablet  Commonly known as:  DESYREL  Take 50 mg by mouth at bedtime.     triamcinolone cream 0.1 %  Commonly known as:  KENALOG  Apply 1 application topically 2 (two) times daily as needed (Eczema on legs).        Diagnostic Studies: Dg Chest 2 View  09/08/2013   CLINICAL DATA:  Preop for right knee surgery  EXAM: CHEST  2 VIEW  COMPARISON:  04/05/2011  FINDINGS: Cardiomediastinal silhouette is stable. There is tenting of the right hemidiaphragm. No acute infiltrate or pulmonary edema. Metallic fixation plate again noted cervical spine. Small to moderate hiatal hernia.  IMPRESSION: No active cardiopulmonary disease. Tenting of the right hemidiaphragm. Small to moderate size hiatal hernia.   Electronically Signed   By: Lahoma Crocker M.D.   On: 09/08/2013 10:46   Ct Angio Chest Pe W/cm &/or Wo Cm  09/15/2013   CLINICAL DATA:  Hypoxia. Syncope. Postop from right knee surgery. Clinical suspicion for pulmonary embolism.  EXAM: CT ANGIOGRAPHY CHEST WITH CONTRAST  TECHNIQUE: Multidetector CT imaging of the chest was performed using the standard protocol during bolus administration of intravenous  contrast. Multiplanar CT image reconstructions and MIPs were obtained to evaluate the vascular anatomy.  CONTRAST:  21mL OMNIPAQUE IOHEXOL 350 MG/ML SOLN  COMPARISON:  06/20/2008  FINDINGS: Satisfactory opacification of pulmonary arteries noted, and no pulmonary emboli identified. No evidence of thoracic aortic dissection or aneurysm. No evidence of mediastinal hematoma or mass. A moderate size hiatal hernia is noted. No hilar or mediastinal masses are identified. No adenopathy seen elsewhere within the thorax.  Mild cardiomegaly is noted. No evidence of pleural or pericardial effusion. Mild bibasilar atelectasis or scarring is noted. No evidence of pulmonary consolidation or mass. No central endobronchial lesion identified.  Review of the MIP images confirms the above findings.  IMPRESSION: No evidence of pulmonary embolism.  Mild bibasilar atelectasis versus scarring. No evidence of pulmonary airspace disease or mass.  Moderate size hiatal hernia.  Mild cardiomegaly.   Electronically Signed   By: Earle Gell M.D.   On: 09/15/2013 19:39    Disposition: 01-Home or Self Care  Discharge Orders   Future Appointments Provider Department Dept Phone   11/01/2013 10:00 AM Marletta Lor, MD [place] [facility] [place]   Future Orders Complete By Expires   Call MD / Call 911  As directed    Comments:     If you experience chest pain or shortness of breath, CALL 911 and be transported to the hospital emergency room.  If you develope a fever above 101 F, pus (white drainage) or increased drainage or redness at the wound, or calf pain, call your surgeon's office.   Constipation Prevention  As directed    Comments:     Drink plenty of fluids.  Prune juice [place] be helpful.  You [place] use a stool softener, such as Colace (over the counter) 100 mg twice a day.  Use MiraLax (over the counter) for constipation as needed.   Diet - low sodium heart healthy  As directed    Discharge instructions   As directed    Comments:     OK to weight bear as tolerated in knee immobilizer Hold off for now on knee range of motion Keep incision dry   Increase activity slowly as tolerated  As directed          Signed: Lafe Clerk SCOTT 09/20/2013, 10:42 AM

## 2013-09-20 NOTE — Progress Notes (Signed)
Physical medicine and rehabilitation consult requested and chart reviewed. Physical and occupational therapy evaluations have been completed and ongoing status post right knee patella ectomy with removal of patellar component 09/15/2013. Patient is progressing nicely and recommendations have been made for home health therapies and social worker is aware of recommendations. Will hold on formal rehabilitation consult at this time with recommendations of home health therapies.

## 2013-09-20 NOTE — Progress Notes (Signed)
Subjective: Pt stable - mobilizing better   Objective: Vital signs in last 24 hours: Temp:  [97.8 F (36.6 C)-98.6 F (37 C)] 97.9 F (36.6 C) (03/09 0522) Pulse Rate:  [61-68] 61 (03/09 0522) Resp:  [16-18] 18 (03/09 0522) BP: (113-124)/(46-60) 124/59 mmHg (03/09 0522) SpO2:  [95 %-97 %] 95 % (03/09 0522) Weight:  [82.69 kg (182 lb 4.8 oz)] 82.69 kg (182 lb 4.8 oz) (03/09 0706)  Intake/Output from previous day: 03/08 0701 - 03/09 0700 In: -  Out: 2 [Stool:2] Intake/Output this shift:    Exam:  Neurovascular intact Sensation intact distally Intact pulses distally  Labs: No results found for this basename: HGB,  in the last 72 hours No results found for this basename: WBC, RBC, HCT, PLT,  in the last 72 hours  Recent Labs  09/19/13 0605  NA 138  K 3.9  CL 99  CO2 27  BUN 17  CREATININE 0.95  GLUCOSE 103*  CALCIUM 8.7   No results found for this basename: LABPT, INR,  in the last 72 hours  Assessment/Plan: Plan dc to home today - pt states she will have 24 hour supervision   Ashlan Dignan SCOTT 09/20/2013, 10:36 AM

## 2013-09-20 NOTE — Progress Notes (Signed)
[facility] PROGRESS NOTE  [name] PJA:[id number] DOB: October 04, 1929 DOA: 09/14/2013 PCP: Nyoka Cowden, MD  Assessment/Plan: 1.Hypoxia/probable acute on chronic diastolic CHF -CT angiogram negative for PE -BNP elevated at 1858, and patient has history of chronic diastolic CHF -Status post IV Lasix x1 this hospital stay and has not required any further diuresis. 2. Hypotension -Resolved off BP meds, follow 3. Pseudomonas urinary tract infection -continue cipro started on 3/7 4. Hyperkalemia -Resolved off spironolactone and ACE inhibitor -ACEi resumed and k remaining stable/wnl 5. History of Hypertension -ACE held secondary to hypotension as above on admission, now resumed- follow  6. Patellar fracture -Per primary team, status post surgical repair 7.CAD -She is chest pain free, continue ACE inhibitor and beta blocker as blood pressures remaining stable.   Patient medically ready for discharge  IM standpoint when okay per primary. She is to follow up with her PCP  Code Status: Full Family Communication: Family at bedside Disposition Plan: Per primary team planning discharge tomorrow    Antibiotics:  Rocephin started 3/5>> 3/7  Cipro started 3/7 >>  HPI/Subjective: States she feels much better today, denies any new complaints. Ready to go home.  Objective: Filed Vitals:   09/20/13 1047  BP: 124/59  Pulse:   Temp:   Resp:     Intake/Output Summary (Last 24 hours) at 09/20/13 1352 Last data filed at 09/20/13 0600  Gross per 24 hour  Intake      0 ml  Output      2 ml  Net     -2 ml   Filed Weights   09/20/13 0706  Weight: 82.69 kg (182 lb 4.8 oz)    Exam:  General: alert & oriented x 3 In NAD Cardiovascular: RRR, nl S1 s2 Respiratory: Decreased breath sounds at the bases Abdomen: soft +BS NT/ND, no masses palpable Extremities: No cyanosis and no edema    Data Reviewed: Basic Metabolic Panel:  Recent Labs Lab 09/14/13 1327  09/15/13 1638 09/15/13 1727 09/16/13 0520 09/19/13 0605  NA 143  --  137 143 138  K 5.0  --  4.7 4.3 3.9  CL  --   --  101 109 99  CO2  --   --  20 23 27   GLUCOSE 99  --  103* 103* 103*  BUN  --   --  29* 21 17  CREATININE  --   --  1.19* 1.01 0.95  CALCIUM  --   --  8.4 8.5 8.7  MG  --  2.0  --   --   --    Liver Function Tests:  Recent Labs Lab 09/15/13 1727  AST 124*  ALT 48*  ALKPHOS 107  BILITOT 0.3  PROT 6.5  ALBUMIN 3.1*   No results found for this basename: LIPASE, AMYLASE,  in the last 168 hours No results found for this basename: AMMONIA,  in the last 168 hours CBC:  Recent Labs Lab 09/14/13 1327 09/15/13 1638 09/16/13 0520  WBC  --  8.6 5.2  HGB 9.9* 7.9* 8.0*  HCT 29.0* 26.5* 26.6*  MCV  --  80.3 80.9  PLT  --  154 150   Cardiac Enzymes:  Recent Labs Lab 09/15/13 1726 09/15/13 2245 09/16/13 0520  TROPONINI <0.30 <0.30 <0.30   BNP (last 3 results)  Recent Labs  09/15/13 1640  PROBNP 1858.0*   CBG:  Recent Labs Lab 09/15/13 1624  GLUCAP 88    Recent Results (from the past 240 hour(s))  URINE CULTURE     Status: None   Collection Time    09/16/13  6:48 AM      Result Value Ref Range Status   Specimen Description URINE, RANDOM   Final   Special Requests NONE   Final   Culture  Setup Time     Final   Value: 09/16/2013 14:55     Performed at [facility]     Final   Value: 50,000 COLONIES/ML     Performed at [facility]   Culture     Final   Value: PSEUDOMONAS AERUGINOSA     Performed at [facility]   Report Status 09/18/2013 FINAL   Final   Organism ID, Bacteria PSEUDOMONAS AERUGINOSA   Final     Studies: No results found.  Scheduled Meds: . aspirin EC  325 mg Oral Q breakfast  . baclofen  10 mg Oral TID  . benazepril  20 mg Oral Daily  . ciprofloxacin  500 mg Oral Daily  . docusate sodium  100 mg Oral BID  . ferrous sulfate  325 mg Oral Q breakfast  . gabapentin  600 mg  Oral TID  . metoprolol  50 mg Oral BID  . pantoprazole  40 mg Oral Daily  . phenazopyridine  200 mg Oral TID WC  . sertraline  50 mg Oral Daily  . simvastatin  40 mg Oral QHS  . traZODone  50 mg Oral QHS   Continuous Infusions:    Principal Problem:   Patella fracture Active Problems:   DEPRESSION   HYPERTENSION   CORONARY ARTERY DISEASE   GERD   Hypoxia   Lethargy   Hypotension, unspecified   Dehydration    Time spent:25MINS    [facility] Pager [phone]. If 7PM-7AM, please contact night-coverage at [email or web address], password [facility] 09/20/2013, 1:52 PM  LOS: 6 days

## 2013-09-20 NOTE — Progress Notes (Signed)
Physical Therapy Treatment Patient Details Name: [name] MRN: [id number] DOB: [birth year] Today's Date: 09/20/2013 Time: 7564-3329 PT Time Calculation (min): 26 min  PT Assessment / Plan / Recommendation  History of Present Illness [name] is an 80 year old female who underwent right total knee revision 2 years ago. She did very well with that. She had a fall in December and had significant knee pain. Plain radiographs were negative. She has had continued symptoms and notes some deformity around the patella. At the time of her revision she had the patellar button removed but she had a shallow patella left. Subsequent CT scan shows displaced comminuted fracture of the patella. She reports continued pain and wants to undergo operative treatment of this knee despite the inherent risk which are discussed fully with her.  She underwent patellectomy.   PT Comments   Pt is progressing well towards goals, increasing ambulatory distance to 50 ft with Min guard this morning. Reviewing previous notes from weekend, family is concerned that patient is unable to manage at home with supervision, however from PT standpoint she will be fine with 24 hr supervision and home health PT. Will continue to work on functional mobility until d/c.   Follow Up Recommendations  Home health PT;Supervision for mobility/OOB     Does the patient have the potential to tolerate intense rehabilitation     Barriers to Discharge        Equipment Recommendations  3in1 (PT)    Recommendations for Other Services    Frequency Min 5X/week   Progress towards PT Goals Progress towards PT goals: Progressing toward goals  Plan Current plan remains appropriate    Precautions / Restrictions Precautions Precautions: Fall;Knee Required Braces or Orthoses: Knee Immobilizer - Right Knee Immobilizer - Right: On at all times Restrictions Weight Bearing Restrictions: Yes RLE Weight Bearing: Weight bearing as tolerated   Pertinent  Vitals/Pain Pain 4/10 prior to therapy, stating 6-7/10 at end of session Nurse notified via telephone Repositioned for comfort in reclining chair.    Mobility  Bed Mobility Overal bed mobility: Needs Assistance Bed Mobility: Supine to Sit Supine to sit: Supervision General bed mobility comments: Verbal cues for bringing RLE off bed, head of bed elevated. Transfers Overall transfer level: Needs assistance Equipment used: Rolling walker (2 wheeled) Transfers: Sit to/from [facility] Sit to Stand: Min assist Stand pivot transfers: Min guard General transfer comment: Min A to steady RW with sit<>stand. Verbal cues for upright posture and sequencing with stand pivot transfer to [facility] Ambulation/Gait Ambulation/Gait assistance: Min guard Ambulation Distance (Feet): 50 Feet Assistive device: Rolling walker (2 wheeled) Gait Pattern/deviations: Step-through pattern;Drifts right/left;Trunk flexed Gait velocity: decreased General Gait Details: Knee immobilizer in place and prevented buckling. Verbal cues for upright posture and walker position with gait. Intermittent cues to correct veering to R/L.    Exercises     PT Diagnosis:    PT Problem List:   PT Treatment Interventions:     PT Goals (current goals can now be found in the care plan section) Acute Rehab PT Goals PT Goal Formulation: With patient Time For Goal Achievement: 09/22/13 Potential to Achieve Goals: Good  Visit Information  Last PT Received On: 09/20/13 Assistance Needed: +1 History of Present Illness: [name] is an 80 year old female who underwent right total knee revision 2 years ago. She did very well with that. She had a fall in December and had significant knee pain. Plain radiographs were negative. She has had continued symptoms and notes some deformity  around the patella. At the time of her revision she had the patellar button removed but she had a shallow patella left. Subsequent CT scan shows  displaced comminuted fracture of the patella. She reports continued pain and wants to undergo operative treatment of this knee despite the inherent risk which are discussed fully with her.  She underwent patellectomy.    Subjective Data  Subjective: Feeling much better, would like to go home.   Cognition  Cognition Arousal/Alertness: Awake/alert Behavior During Therapy: WFL for tasks assessed/performed Overall Cognitive Status: Within Functional Limits for tasks assessed    Balance     End of Session PT - End of Session Equipment Utilized During Treatment: Gait belt;Right knee immobilizer Activity Tolerance: Patient tolerated treatment well Patient left: in chair;with call bell/phone within reach Nurse Communication: Mobility status;Patient requests pain meds   GP     [name], [name]  [name] 09/20/2013, 10:09 AM

## 2013-09-20 NOTE — Progress Notes (Signed)
OT Cancellation Note and Discharge  Patient Details Name: [name] MRN: [id number] DOB: 31-Dec-1929   Cancelled Treatment:     In to speak to pt about having any further questions about BADLs and how to go about this since pt is in [place] (on at all times except in bed). Pt nor daughter who will be her primary caregiver have any questions about BADLs. No further OT needs, we will sign off.   Almon Register [phone] 09/20/2013, 9:57 AM

## 2013-09-21 ENCOUNTER — Telehealth: Payer: Self-pay | Admitting: Internal Medicine

## 2013-09-21 NOTE — Telephone Encounter (Signed)
[facility] Triage Call Report Triage Record Num: [id number] Operator: Liana Gerold Patient Name: [name] Call Date & Time: 09/20/2013 5:50:10PM Patient Phone: [phone] PCP: Marletta Lor Patient Gender: Female PCP Fax : [phone] Patient DOB: 16-Massie-1931 Practice Name: [facility] Reason for Call: Caller: Robin/Other; PCP: Bluford Kaufmann (Family Practice > 8yrs old); CB#: [phone]; Call regarding Vomiting; Onset 09/20/13 Pt states she was discharge from hospital and has had vomiting 3-4/4 hour, and started when she was in the wheelchair and became worse when riding in the car. All emergency symptoms ruled out per Nausea or Vomiting protocol with exception "Symptoms start suddenly with motion, especially travel." Home care advice given. Protocol(s) Used: Nausea or Vomiting Recommended Outcome per Protocol: Provide Home/Self Care Reason for Outcome: Symptoms start suddenly with motion, especially travel (ship, plane, train, car) Care Advice: ~ Call provider if symptoms are severe and continue after travel or motion stops. ~ SYMPTOM / CONDITION MANAGEMENT Nausea Care Advice: - Drink small amounts of clear, sweetened liquids or ice cold drinks. - Eat light, bland foods such as saltine crackers or plain bread. - Do not eat high fat, highly seasoned, high fiber, or high sugar content foods. - Avoid mixing hot food and cold foods. - Eat smaller, more frequent meals. - Rest as much as possible in a sitting or in a propped lying position. Do not lie flat for at least 2 hours after eating. - Do not take pain medication (such as aspirin, NSAIDs) while nauseated. - Rest as much as possible until symptoms improve since activity [place] worsen nausea. ~ Vomiting Care Advice: - Do not eat solid foods until vomiting subsides. - Begin taking fluids by sucking on ice chips or popsicles or taking sips of cool clear, nonprescription oral rehydration solution). - Gradually  drink larger amounts of these fluids so that you are drinking six to eight 8 oz. (.2 liter) of fluids a day. - Keep activity to a minimum. - After vomiting subsides, eat smaller, more frequent meals of easily digested foods such as crackers, toast, bananas, rice, cooked cereal, applesauce, broth, baked or mashed potatoes, chicken or [place] without skin. Eat slowly. - Take fluids 30 minutes before or 60 minutes after meals. - Avoid high fat, highly seasoned, high fiber or high sugar content foods. - Avoid extremely hot or cold foods. - Do not take pain medication (such as aspirin, NSAIDs) while nauseated or vomiting. - Consult your provider for advice regarding continuing prescription medication. - Rest as much as possible in a sitting or in a propped lying position. Do not lie flat for at least 2 hours after eating. ~ 09/20/2013 6:08:49PM Page 1 of 1 [id number]

## 2013-09-23 ENCOUNTER — Telehealth: Payer: Self-pay | Admitting: Internal Medicine

## 2013-09-23 NOTE — Telephone Encounter (Signed)
Left detailed message for [name], gave verbal order for OT one additional visit for training and home exercise program, functional mobility and ADL fine for patient.

## 2013-09-23 NOTE — Telephone Encounter (Signed)
Pt evaluated tues, OT one additional visit for training and home exercise program, functional mobility and IADL. pls call and LM if md doesn't agree. They will send paperwork for signature

## 2013-10-19 ENCOUNTER — Other Ambulatory Visit: Payer: Self-pay | Admitting: Internal Medicine

## 2013-11-01 ENCOUNTER — Ambulatory Visit: Payer: Medicare HMO | Admitting: Internal Medicine

## 2013-11-03 ENCOUNTER — Ambulatory Visit: Payer: Medicare HMO | Admitting: Internal Medicine

## 2013-12-07 ENCOUNTER — Telehealth: Payer: Self-pay | Admitting: Internal Medicine

## 2013-12-07 NOTE — Telephone Encounter (Signed)
Pt needs [facility] referral , the current one expired 4/30. Pt last seen 4/15.  Pt fell over the weekend and pt has appt tomorrow at 3:30pm, 5/27 w/ dr Belenda Cruise dean Dx code: [phone]  Post op appt, but pt also fell so appt was made sooner.

## 2013-12-20 ENCOUNTER — Encounter: Payer: Self-pay | Admitting: *Deleted

## 2013-12-21 ENCOUNTER — Encounter (INDEPENDENT_AMBULATORY_CARE_PROVIDER_SITE_OTHER): Payer: Self-pay

## 2013-12-21 ENCOUNTER — Ambulatory Visit (INDEPENDENT_AMBULATORY_CARE_PROVIDER_SITE_OTHER): Payer: Medicare HMO | Admitting: Neurology

## 2013-12-21 ENCOUNTER — Encounter: Payer: Self-pay | Admitting: Neurology

## 2013-12-21 VITALS — BP 125/65 | HR 64 | Ht 63.0 in | Wt 186.0 lb

## 2013-12-21 DIAGNOSIS — R279 Unspecified lack of coordination: Secondary | ICD-10-CM

## 2013-12-21 DIAGNOSIS — R259 Unspecified abnormal involuntary movements: Secondary | ICD-10-CM

## 2013-12-21 DIAGNOSIS — R278 Other lack of coordination: Secondary | ICD-10-CM | POA: Insufficient documentation

## 2013-12-21 NOTE — Progress Notes (Signed)
[facility]    Provider:  Dr Janann Colonel Referring Provider: Marletta Lor, MD Primary Care Physician:  Gerrit Heck, MD  CC:  Abnormal involuntary movements  HPI:  [name] is a 80 y.o. female here as a referral from Dr. Orland Penman for abnormal involuntary movements.   Describes jerking movements of the trunk and her extremities. She had surgery 09/14/2013, has had brief loss of muscle tone (?asterixis) immediately after surgery, this has continued since surgery. Was severe after surgery and then has gotten progressively better. Around 1 month ago was started on Cymbalta which caused generalized jerking episodes of her trunk, the cymbalta was stopped and the generlaized jerking episodes have resolved. Continues to have episodes of arm dropping, where she is unable to keep her hand up. Lasts a few seconds and then she is able to move her extremities normally. Notes she is dropping items due to the "muscle drops".   With prior surgeries will have episodes of loss of muscle tone but the whole body jerking episodes are new. She has been on hydrocodone for years, typically will take 3 to 4 a week. Currently taking Gabapentin 600mg  TID.   Review of Systems: Out of a complete 14 system review, the patient complains of only the following symptoms, and all other reviewed systems are negative. + fatigue, eye pain, anemia, weakness, restless legs, joint pain  History   Social History  . Marital Status: Married    Spouse Name: N/A    Number of Children: N/A  . Years of Education: N/A   Occupational History  . Not on file.   Social History Main Topics  . Smoking status: Former Smoker    Quit date: 07/16/1967  . Smokeless tobacco: Never Used  . Alcohol Use: No  . Drug Use: No  . Sexual Activity: Not on file   Other Topics Concern  . Not on file   Social History Narrative   Married, 2 children   Right handed   8th grade   1 cup daily    Family  History  Problem Relation Age of Onset  . Osteoarthritis Mother   . Heart failure Mother     Past Medical History  Diagnosis Date  . ANEMIA 06/30/2007  . COLONIC POLYPS, HX OF 01/21/2007  . COLONIC POLYPS, HX OF 01/21/2007  . DEGENERATIVE JOINT DISEASE 05/19/2007  . DEPRESSION 02/07/2010  . DIASTOLIC HEART FAILURE, CHRONIC 02/27/2009  . DIVERTICULOSIS, COLON 01/21/2007  . DYSPNEA 11/13/2009  . Edema 11/13/2009  . GERD 01/21/2007  . HIATAL HERNIA WITH REFLUX 01/21/2007  . HYPERTENSION 01/21/2007  . LIMB PAIN 11/13/2009  . NECK PAIN, CHRONIC 02/13/2010  . Pure hypercholesterolemia 04/05/2008  . PONV (postoperative nausea and vomiting)     Pt had problems with memory after procedure  . Colitis     Hx: of  . Pneumonia   . Bronchitis     Hx: of  . FHx: allergies     Hx;of  . Heart palpitations     Hx: of  . Headache(784.0)   . CORONARY ARTERY DISEASE 04/05/2008    BMS to RCA 10/2007  . TIA (transient ischemic attack)     Past Surgical History  Procedure Laterality Date  . Appendectomy    . Cholecystectomy    . Abdominal hysterectomy    . Lumbar laminectomy    . Total knee arthroplasty    . Rotator cuff repair    . Cardiac stent    . Neck surgery    .  Cataract extraction w/ intraocular lens  implant, bilateral      Hx: of  . Cardiac catheterization      2009  . Tonsillectomy    . Dilation and curettage of uterus    . Patellectomy Right 09/14/2013    DR Marlou Sa  . Patellectomy Right 09/14/2013    Procedure: PATELLECTOMY;  Surgeon: Meredith Pel, MD;  Location: [facility];  Service: Orthopedics;  Laterality: Right;    Current Outpatient Prescriptions  Medication Sig Dispense Refill  . Ascorbic Acid (VITAMIN C) 500 MG CAPS Take by mouth.      Marland Kitchen aspirin EC 325 MG tablet Take 1 tablet (325 mg total) by mouth daily.  30 tablet  0  . baclofen (LIORESAL) 10 MG tablet Take 10 mg by mouth 3 (three) times daily.      . benazepril (LOTENSIN) 40 MG tablet Take 0.5 tablets (20 mg total) by mouth  daily.      . bisacodyl (DULCOLAX) 5 MG EC tablet Take 2 tablets (10 mg total) by mouth daily as needed for moderate constipation.  30 tablet  0  . ciprofloxacin (CIPRO) 500 MG tablet Take 1 tablet (500 mg total) by mouth daily.  7 tablet  0  . ferrous sulfate 325 (65 FE) MG tablet Take 325 mg by mouth daily with breakfast.       . furosemide (LASIX) 20 MG tablet Take 1 tablet (20 mg total) by mouth daily as needed.  90 tablet  3  . gabapentin (NEURONTIN) 600 MG tablet Take 1 tablet (600 mg total) by mouth 3 (three) times daily.  270 tablet  3  . HYDROcodone-acetaminophen (NORCO) 5-325 MG per tablet Take 1-2 tablets by mouth every 6 (six) hours as needed for moderate pain.  60 tablet  0  . LORazepam (ATIVAN) 0.5 MG tablet TAKE ONE TABLET BY MOUTH EVERY 8 HOURS AS NEEDED FOR ANXIETY  180 tablet  0  . metoprolol (LOPRESSOR) 50 MG tablet Take 1 tablet (50 mg total) by mouth 2 (two) times daily.  180 tablet  3  . metoprolol (LOPRESSOR) 50 MG tablet Take 1 tablet (50 mg total) by mouth 2 (two) times daily.  30 tablet  0  . Multiple Vitamins-Minerals (MULTIVITAMIN WITH MINERALS) tablet Take 1 tablet by mouth daily.      . nitroGLYCERIN (NITROSTAT) 0.4 MG SL tablet Place 0.4 mg under the tongue every 5 (five) minutes as needed for chest pain.       . phenazopyridine (PYRIDIUM) 200 MG tablet Take 1 tablet (200 mg total) by mouth 3 (three) times daily with meals.  6 tablet  0  . sertraline (ZOLOFT) 50 MG tablet Take 1 tablet (50 mg total) by mouth daily.  90 tablet  1  . simvastatin (ZOCOR) 40 MG tablet Take 1 tablet (40 mg total) by mouth at bedtime.  90 tablet  3  . spironolactone (ALDACTONE) 25 MG tablet Take 1 tablet (25 mg total) by mouth daily.  90 tablet  3  . traMADol (ULTRAM) 50 MG tablet Take by mouth every 6 (six) hours as needed for moderate pain.      . traZODone (DESYREL) 100 MG tablet Take 50 mg by mouth at bedtime.      . triamcinolone cream (KENALOG) 0.1 % Apply 1 application topically 2  (two) times daily as needed (Eczema on legs).      Marland Kitchen omeprazole (PRILOSEC) 40 MG capsule Take 1 capsule (40 mg total) by mouth daily.  [place]  capsule  3   No current facility-administered medications for this visit.    Allergies as of 12/21/2013  . (No Known Allergies)    Vitals: BP 125/65  Pulse 64  Ht 5\' 3"  (1.6 m)  Wt 186 lb (84.369 kg)  BMI 32.96 kg/m2 Last Weight:  Wt Readings from Last 1 Encounters:  12/21/13 186 lb (84.369 kg)   Last Height:   Ht Readings from Last 1 Encounters:  12/21/13 5\' 3"  (1.6 m)     Physical exam: Exam: Gen: NAD, conversant Eyes: anicteric sclerae, moist conjunctivae HENT: Atraumatic, oropharynx clear Neck: Trachea midline; supple,  Lungs: CTA, no wheezing, rales, rhonic                          CV: RRR, no MRG Abdomen: Soft, non-tender;  Extremities: No peripheral edema  Skin: Normal temperature, no rash,  Psych: Appropriate affect, pleasant  Neuro: MS: AA&Ox3, appropriately interactive, normal affect   Attention: WORLD backwards  Speech: fluent w/o paraphasic error  Memory: good recent and remote recall  CN: PERRL, EOMI no nystagmus, no ptosis, sensation intact to LT V1-V3 bilat, face symmetric, no weakness, hearing grossly intact, palate elevates symmetrically, shoulder shrug 5/5 bilat,  tongue protrudes midline, no fasiculations noted.  Motor: normal bulk and tone Strength: 5/5  In all extremities  Coord: rapid alternating and point-to-point (FNF, HTS) movements intact. Noted asterixis bilateral UE with extension of wrists and arms  Reflexes: absent patellar and AJ bilat, bilat downgoing toes  Sens: LT intact in all extremities  Gait: deferred due to pain   Assessment:  After physical and neurologic examination, review of laboratory studies, imaging, neurophysiology testing and pre-existing records, assessment will be reviewed on the problem list.  Plan:  Treatment plan and additional workup will be reviewed under  Problem List.  1)Asterixis 2)Abnormal involuntary movements  80y/o woman presenting for initial evaluation of episodes of involuntary loss of muscle tone and episodes of abnormal jerking movements which have resolved. Suspect her symptoms are likely related to polypharmacy. Asterixis and myoclonus can be caused by both gabapentin, opiates and cymbalta. Suspect addition of cymbalta explains recent worsening of myoclonus, of note it has improved with discontinuation. Discussed this with patient and daughter, they expressed understanding. Will try to decrease Gabapentin to 600mg  BID to see if improvement in asterixis. She Kope benefit from referral to a pain clinic for further optimization of her pain regimen.   Jim Like, DO  [facility] [address] [place], [place] [address]  Phone [phone] Fax [phone]

## 2013-12-24 ENCOUNTER — Telehealth: Payer: Self-pay | Admitting: Neurology

## 2013-12-24 NOTE — Telephone Encounter (Signed)
The patient is having ongoing twitching of the arms and legs. She remains on gabapentin 600 mg bid. She is to cut back to 300 mg bid. ? What the renal function is. Blood work was done today by her primary md

## 2013-12-27 ENCOUNTER — Encounter (HOSPITAL_COMMUNITY): Payer: Self-pay | Admitting: Emergency Medicine

## 2013-12-27 ENCOUNTER — Telehealth: Payer: Self-pay | Admitting: Hematology and Oncology

## 2013-12-27 ENCOUNTER — Observation Stay (HOSPITAL_COMMUNITY)
Admission: EM | Admit: 2013-12-27 | Discharge: 2013-12-30 | Disposition: A | Discharge status: Home / Self Care | Payer: Medicare HMO | Attending: Internal Medicine | Admitting: Internal Medicine

## 2013-12-27 ENCOUNTER — Emergency Department (HOSPITAL_COMMUNITY): Payer: Medicare HMO

## 2013-12-27 ENCOUNTER — Other Ambulatory Visit: Payer: Self-pay

## 2013-12-27 DIAGNOSIS — R531 Weakness: Secondary | ICD-10-CM | POA: Diagnosis present

## 2013-12-27 DIAGNOSIS — I5030 Unspecified diastolic (congestive) heart failure: Secondary | ICD-10-CM | POA: Insufficient documentation

## 2013-12-27 DIAGNOSIS — Z96659 Presence of unspecified artificial knee joint: Secondary | ICD-10-CM | POA: Insufficient documentation

## 2013-12-27 DIAGNOSIS — K219 Gastro-esophageal reflux disease without esophagitis: Secondary | ICD-10-CM | POA: Insufficient documentation

## 2013-12-27 DIAGNOSIS — R609 Edema, unspecified: Secondary | ICD-10-CM

## 2013-12-27 DIAGNOSIS — E7800 Pure hypercholesterolemia, unspecified: Secondary | ICD-10-CM | POA: Insufficient documentation

## 2013-12-27 DIAGNOSIS — R5381 Other malaise: Principal | ICD-10-CM | POA: Insufficient documentation

## 2013-12-27 DIAGNOSIS — S82009A Unspecified fracture of unspecified patella, initial encounter for closed fracture: Secondary | ICD-10-CM

## 2013-12-27 DIAGNOSIS — R0902 Hypoxemia: Secondary | ICD-10-CM

## 2013-12-27 DIAGNOSIS — F3289 Other specified depressive episodes: Secondary | ICD-10-CM | POA: Insufficient documentation

## 2013-12-27 DIAGNOSIS — Z8673 Personal history of transient ischemic attack (TIA), and cerebral infarction without residual deficits: Secondary | ICD-10-CM | POA: Insufficient documentation

## 2013-12-27 DIAGNOSIS — I509 Heart failure, unspecified: Secondary | ICD-10-CM | POA: Insufficient documentation

## 2013-12-27 DIAGNOSIS — R5383 Other fatigue: Secondary | ICD-10-CM

## 2013-12-27 DIAGNOSIS — I5032 Chronic diastolic (congestive) heart failure: Secondary | ICD-10-CM | POA: Diagnosis present

## 2013-12-27 DIAGNOSIS — R278 Other lack of coordination: Secondary | ICD-10-CM

## 2013-12-27 DIAGNOSIS — Z79899 Other long term (current) drug therapy: Secondary | ICD-10-CM | POA: Insufficient documentation

## 2013-12-27 DIAGNOSIS — K5730 Diverticulosis of large intestine without perforation or abscess without bleeding: Secondary | ICD-10-CM

## 2013-12-27 DIAGNOSIS — D649 Anemia, unspecified: Secondary | ICD-10-CM

## 2013-12-27 DIAGNOSIS — Z86010 Personal history of colonic polyps: Secondary | ICD-10-CM

## 2013-12-27 DIAGNOSIS — R51 Headache: Secondary | ICD-10-CM | POA: Insufficient documentation

## 2013-12-27 DIAGNOSIS — Z90710 Acquired absence of both cervix and uterus: Secondary | ICD-10-CM | POA: Insufficient documentation

## 2013-12-27 DIAGNOSIS — K449 Diaphragmatic hernia without obstruction or gangrene: Secondary | ICD-10-CM

## 2013-12-27 DIAGNOSIS — I959 Hypotension, unspecified: Secondary | ICD-10-CM

## 2013-12-27 DIAGNOSIS — I2510 Atherosclerotic heart disease of native coronary artery without angina pectoris: Secondary | ICD-10-CM | POA: Insufficient documentation

## 2013-12-27 DIAGNOSIS — Z01810 Encounter for preprocedural cardiovascular examination: Secondary | ICD-10-CM

## 2013-12-27 DIAGNOSIS — I10 Essential (primary) hypertension: Secondary | ICD-10-CM

## 2013-12-27 DIAGNOSIS — M1990 Unspecified osteoarthritis, unspecified site: Secondary | ICD-10-CM

## 2013-12-27 DIAGNOSIS — Z9089 Acquired absence of other organs: Secondary | ICD-10-CM | POA: Insufficient documentation

## 2013-12-27 DIAGNOSIS — D509 Iron deficiency anemia, unspecified: Secondary | ICD-10-CM | POA: Insufficient documentation

## 2013-12-27 DIAGNOSIS — R0602 Shortness of breath: Secondary | ICD-10-CM

## 2013-12-27 DIAGNOSIS — F329 Major depressive disorder, single episode, unspecified: Secondary | ICD-10-CM | POA: Insufficient documentation

## 2013-12-27 DIAGNOSIS — E860 Dehydration: Secondary | ICD-10-CM | POA: Insufficient documentation

## 2013-12-27 DIAGNOSIS — R259 Unspecified abnormal involuntary movements: Secondary | ICD-10-CM | POA: Insufficient documentation

## 2013-12-27 DIAGNOSIS — I5033 Acute on chronic diastolic (congestive) heart failure: Secondary | ICD-10-CM | POA: Diagnosis present

## 2013-12-27 LAB — COMPREHENSIVE METABOLIC PANEL
ALT: 10 U/L (ref 0–35)
AST: 32 U/L (ref 0–37)
Albumin: 3.5 g/dL (ref 3.5–5.2)
Alkaline Phosphatase: 74 U/L (ref 39–117)
BUN: 24 mg/dL — ABNORMAL HIGH (ref 6–23)
CO2: 24 mEq/L (ref 19–32)
Calcium: 9.3 mg/dL (ref 8.4–10.5)
Chloride: 99 mEq/L (ref 96–112)
Creatinine, Ser: 1.06 mg/dL (ref 0.50–1.10)
GFR calc Af Amer: 56 mL/min — ABNORMAL LOW (ref >90)
GFR calc non Af Amer: 48 mL/min — ABNORMAL LOW (ref >90)
Glucose, Bld: 84 mg/dL (ref 70–99)
Potassium: 5.3 mEq/L (ref 3.7–5.3)
Sodium: 137 mEq/L (ref 137–147)
Total Bilirubin: <0.2 mg/dL — ABNORMAL LOW (ref 0.3–1.2)
Total Protein: 7.4 g/dL (ref 6.0–8.3)

## 2013-12-27 LAB — ABO/RH: ABO/RH(D): A POS

## 2013-12-27 LAB — CBC WITH DIFFERENTIAL/PLATELET
Basophils Absolute: 0 10*3/uL (ref 0.0–0.1)
Basophils Relative: 0 % (ref 0–1)
Eosinophils Absolute: 0.1 10*3/uL (ref 0.0–0.7)
Eosinophils Relative: 2 % (ref 0–5)
HCT: 31.1 % — ABNORMAL LOW (ref 36.0–46.0)
Hemoglobin: 8.9 g/dL — ABNORMAL LOW (ref 12.0–15.0)
Lymphocytes Relative: 31 % (ref 12–46)
Lymphs Abs: 2.2 10*3/uL (ref 0.7–4.0)
MCH: 22 pg — ABNORMAL LOW (ref 26.0–34.0)
MCHC: 28.6 g/dL — ABNORMAL LOW (ref 30.0–36.0)
MCV: 77 fL — ABNORMAL LOW (ref 78.0–100.0)
Monocytes Absolute: 0.7 10*3/uL (ref 0.1–1.0)
Monocytes Relative: 10 % (ref 3–12)
Neutro Abs: 4 10*3/uL (ref 1.7–7.7)
Neutrophils Relative %: 57 % (ref 43–77)
Platelets: 236 10*3/uL (ref 150–400)
RBC: 4.04 MIL/uL (ref 3.87–5.11)
RDW: 20.1 % — ABNORMAL HIGH (ref 11.5–15.5)
WBC: 7.1 10*3/uL (ref 4.0–10.5)

## 2013-12-27 LAB — URINALYSIS, ROUTINE W REFLEX MICROSCOPIC
Bilirubin Urine: NEGATIVE
Glucose, UA: NEGATIVE mg/dL
Hgb urine dipstick: NEGATIVE
Ketones, ur: NEGATIVE mg/dL
Nitrite: NEGATIVE
Protein, ur: NEGATIVE mg/dL
Specific Gravity, Urine: 1.023 (ref 1.005–1.030)
Urobilinogen, UA: 0.2 mg/dL (ref 0.0–1.0)
pH: 5.5 (ref 5.0–8.0)

## 2013-12-27 LAB — PRO B NATRIURETIC PEPTIDE: Pro B Natriuretic peptide (BNP): 1267 pg/mL — ABNORMAL HIGH (ref 0–450)

## 2013-12-27 LAB — CK: Total CK: 38 U/L (ref 7–177)

## 2013-12-27 LAB — TYPE AND SCREEN
ABO/RH(D): A POS
Antibody Screen: NEGATIVE

## 2013-12-27 LAB — URINE MICROSCOPIC-ADD ON

## 2013-12-27 LAB — TROPONIN I: Troponin I: <0.3 ng/mL (ref <0.30)

## 2013-12-27 MED ORDER — SODIUM CHLORIDE 0.9 % IV SOLN
INTRAVENOUS | Status: DC
  Administered 2013-12-27 – 2013-12-28 (×2): via INTRAVENOUS

## 2013-12-27 MED ORDER — ENOXAPARIN SODIUM 40 MG/0.4ML ~~LOC~~ SOLN
40.0000 mg | SUBCUTANEOUS | Status: DC
  Administered 2013-12-27 – 2013-12-29 (×3): 40 mg via SUBCUTANEOUS
  Filled 2013-12-27 (×4): qty 0.4

## 2013-12-27 MED ORDER — SIMVASTATIN 40 MG PO TABS
40.0000 mg | ORAL_TABLET | Freq: Every day | ORAL | Status: DC
  Administered 2013-12-27 – 2013-12-28 (×2): 40 mg via ORAL
  Filled 2013-12-27 (×4): qty 1

## 2013-12-27 MED ORDER — SERTRALINE HCL 50 MG PO TABS
50.0000 mg | ORAL_TABLET | Freq: Every day | ORAL | Status: DC
  Administered 2013-12-28 – 2013-12-30 (×3): 50 mg via ORAL
  Filled 2013-12-27 (×3): qty 1

## 2013-12-27 MED ORDER — TRAZODONE HCL 50 MG PO TABS
50.0000 mg | ORAL_TABLET | Freq: Every day | ORAL | Status: DC
  Administered 2013-12-27 – 2013-12-29 (×3): 50 mg via ORAL
  Filled 2013-12-27 (×6): qty 1

## 2013-12-27 MED ORDER — BENAZEPRIL HCL 20 MG PO TABS
20.0000 mg | ORAL_TABLET | Freq: Every day | ORAL | Status: DC
  Administered 2013-12-28 – 2013-12-30 (×3): 20 mg via ORAL
  Filled 2013-12-27 (×3): qty 1

## 2013-12-27 MED ORDER — SODIUM CHLORIDE 0.9 % IV SOLN
INTRAVENOUS | Status: DC
  Administered 2013-12-27: 11:00:00 via INTRAVENOUS

## 2013-12-27 MED ORDER — LORAZEPAM 0.5 MG PO TABS: 0.5000 mg | ORAL_TABLET | Freq: Three times a day (TID) | ORAL | Status: DC | PRN

## 2013-12-27 MED ORDER — FERROUS SULFATE 325 (65 FE) MG PO TABS
325.0000 mg | ORAL_TABLET | Freq: Every day | ORAL | Status: DC
  Administered 2013-12-28 – 2013-12-30 (×3): 325 mg via ORAL
  Filled 2013-12-27 (×4): qty 1

## 2013-12-27 MED ORDER — GABAPENTIN 600 MG PO TABS
600.0000 mg | ORAL_TABLET | Freq: Every day | ORAL | Status: DC
  Filled 2013-12-27: qty 1

## 2013-12-27 MED ORDER — HYDROCODONE-ACETAMINOPHEN 5-325 MG PO TABS: 1.0000 | ORAL_TABLET | Freq: Four times a day (QID) | ORAL | Status: DC | PRN

## 2013-12-27 NOTE — ED Provider Notes (Signed)
CSN: [id number]     Arrival date & time 12/27/13  1032 History   First MD Initiated Contact with Patient 12/27/13 1058     Chief Complaint  Patient presents with  . Weakness  . Fatigue     (Consider location/radiation/quality/duration/timing/severity/associated sxs/prior Treatment) Patient is a 80 y.o. female presenting with weakness. The history is provided by the patient and a relative.  Weakness   patient here complaining of weakness and fatigue x1 week. History of anemia and this is similar. She denies any recent history of blood loss. No black or bloody stools. No focal deficits noted. Was seen by her Dr. for days ago and had a blood panel done in showed her iron levels were low. No treatment was prescribed at this time. She also notes cough and dyspnea on exertion but denies any anginal type chest pain. Denies any orthopnea. No recent history of falls. Denies any urinary symptoms.  Past Medical History  Diagnosis Date  . ANEMIA 06/30/2007  . COLONIC POLYPS, HX OF 01/21/2007  . COLONIC POLYPS, HX OF 01/21/2007  . DEGENERATIVE JOINT DISEASE 05/19/2007  . DEPRESSION 02/07/2010  . DIASTOLIC HEART FAILURE, CHRONIC 02/27/2009  . DIVERTICULOSIS, COLON 01/21/2007  . DYSPNEA 11/13/2009  . Edema 11/13/2009  . GERD 01/21/2007  . HIATAL HERNIA WITH REFLUX 01/21/2007  . HYPERTENSION 01/21/2007  . LIMB PAIN 11/13/2009  . NECK PAIN, CHRONIC 02/13/2010  . Pure hypercholesterolemia 04/05/2008  . PONV (postoperative nausea and vomiting)     Pt had problems with memory after procedure  . Colitis     Hx: of  . Pneumonia   . Bronchitis     Hx: of  . FHx: allergies     Hx;of  . Heart palpitations     Hx: of  . Headache(784.0)   . CORONARY ARTERY DISEASE 04/05/2008    BMS to RCA 10/2007  . TIA (transient ischemic attack)    Past Surgical History  Procedure Laterality Date  . Appendectomy    . Cholecystectomy    . Abdominal hysterectomy    . Lumbar laminectomy    . Total knee arthroplasty    . Rotator  cuff repair    . Cardiac stent    . Neck surgery    . Cataract extraction w/ intraocular lens  implant, bilateral      Hx: of  . Cardiac catheterization      2009  . Tonsillectomy    . Dilation and curettage of uterus    . Patellectomy Right 09/14/2013    DR Marlou Sa  . Patellectomy Right 09/14/2013    Procedure: PATELLECTOMY;  Surgeon: Meredith Pel, MD;  Location: [facility];  Service: Orthopedics;  Laterality: Right;   Family History  Problem Relation Age of Onset  . Osteoarthritis Mother   . Heart failure Mother    History  Substance Use Topics  . Smoking status: Former Smoker    Quit date: 07/16/1967  . Smokeless tobacco: Never Used  . Alcohol Use: No   OB History   Grav Para Term Preterm Abortions TAB SAB Ect Mult Living                 Review of Systems  Neurological: Positive for weakness.  All other systems reviewed and are negative.     Allergies  Review of patient's allergies indicates no known allergies.  Home Medications   Prior to Admission medications   Medication Sig Start Date End Date Taking? Authorizing Provider  Ascorbic Acid (VITAMIN  C) 500 MG CAPS Take by mouth.    Historical Provider, MD  aspirin EC 325 MG tablet Take 1 tablet (325 mg total) by mouth daily. 09/17/13   Meredith Pel, MD  baclofen (LIORESAL) 10 MG tablet Take 10 mg by mouth 3 (three) times daily.    Historical Provider, MD  benazepril (LOTENSIN) 40 MG tablet Take 0.5 tablets (20 mg total) by mouth daily. 09/18/13   Sheila Oats, MD  bisacodyl (DULCOLAX) 5 MG EC tablet Take 2 tablets (10 mg total) by mouth daily as needed for moderate constipation. 09/20/13   Meredith Pel, MD  ciprofloxacin (CIPRO) 500 MG tablet Take 1 tablet (500 mg total) by mouth daily. 09/18/13   Sheila Oats, MD  ferrous sulfate 325 (65 FE) MG tablet Take 325 mg by mouth daily with breakfast.     Historical Provider, MD  furosemide (LASIX) 20 MG tablet Take 1 tablet (20 mg total) by mouth daily as  needed. 09/02/13   Marletta Lor, MD  gabapentin (NEURONTIN) 600 MG tablet Take 1 tablet (600 mg total) by mouth 3 (three) times daily. 08/19/13   Marletta Lor, MD  HYDROcodone-acetaminophen (NORCO) 5-325 MG per tablet Take 1-2 tablets by mouth every 6 (six) hours as needed for moderate pain. 09/20/13   Meredith Pel, MD  LORazepam (ATIVAN) 0.5 MG tablet TAKE ONE TABLET BY MOUTH EVERY 8 HOURS AS NEEDED FOR ANXIETY    Marletta Lor, MD  metoprolol (LOPRESSOR) 50 MG tablet Take 1 tablet (50 mg total) by mouth 2 (two) times daily. 09/02/13   Marletta Lor, MD  metoprolol (LOPRESSOR) 50 MG tablet Take 1 tablet (50 mg total) by mouth 2 (two) times daily. 09/20/13   Meredith Pel, MD  Multiple Vitamins-Minerals (MULTIVITAMIN WITH MINERALS) tablet Take 1 tablet by mouth daily.    Historical Provider, MD  nitroGLYCERIN (NITROSTAT) 0.4 MG SL tablet Place 0.4 mg under the tongue every 5 (five) minutes as needed for chest pain.     Historical Provider, MD  omeprazole (PRILOSEC) 40 MG capsule Take 1 capsule (40 mg total) by mouth daily. 07/22/12 09/01/13  Marletta Lor, MD  phenazopyridine (PYRIDIUM) 200 MG tablet Take 1 tablet (200 mg total) by mouth 3 (three) times daily with meals. 09/18/13   Sheila Oats, MD  sertraline (ZOLOFT) 50 MG tablet Take 1 tablet (50 mg total) by mouth daily. 09/02/13   Marletta Lor, MD  simvastatin (ZOCOR) 40 MG tablet Take 1 tablet (40 mg total) by mouth at bedtime. 09/02/13   Marletta Lor, MD  spironolactone (ALDACTONE) 25 MG tablet Take 1 tablet (25 mg total) by mouth daily. 08/19/13   Marletta Lor, MD  traMADol (ULTRAM) 50 MG tablet Take by mouth every 6 (six) hours as needed for moderate pain.    Historical Provider, MD  traZODone (DESYREL) 100 MG tablet Take 50 mg by mouth at bedtime.    Historical Provider, MD  triamcinolone cream (KENALOG) 0.1 % Apply 1 application topically 2 (two) times daily as needed (Eczema on legs).     Historical Provider, MD   BP 140/61  Pulse 72  Temp(Src) 98 F (36.7 C)  Resp 16  SpO2 100% Physical Exam  Nursing note and vitals reviewed. Constitutional: She is oriented to person, place, and time. She appears well-developed and well-nourished.  Non-toxic appearance. No distress.  HENT:  Head: Normocephalic and atraumatic.  Eyes: Conjunctivae, EOM and lids are normal. Pupils are  equal, round, and reactive to light.  Neck: Normal range of motion. Neck supple. No tracheal deviation present. No mass present.  Cardiovascular: Normal rate, regular rhythm and normal heart sounds.  Exam reveals no gallop.   No murmur heard. Pulmonary/Chest: Effort normal and breath sounds normal. No stridor. No respiratory distress. She has no decreased breath sounds. She has no wheezes. She has no rhonchi. She has no rales.  Abdominal: Soft. Normal appearance and bowel sounds are normal. She exhibits no distension. There is no tenderness. There is no rebound and no CVA tenderness.  Musculoskeletal: Normal range of motion. She exhibits no edema and no tenderness.  Neurological: She is alert and oriented to person, place, and time. She has normal strength. No cranial nerve deficit or sensory deficit. GCS eye subscore is 4. GCS verbal subscore is 5. GCS motor subscore is 6.  Skin: Skin is warm and dry. No abrasion and no rash noted.  Psychiatric: Her behavior is normal. Her affect is blunt. Her speech is delayed.    ED Course  Procedures (including critical care time) Labs Review Labs Reviewed  CBC WITH DIFFERENTIAL  COMPREHENSIVE METABOLIC PANEL  URINALYSIS, ROUTINE W REFLEX MICROSCOPIC  TROPONIN I  PRO B NATRIURETIC PEPTIDE  TYPE AND SCREEN    Imaging Review No results found.   EKG Interpretation   Date/Time:  Monday December 27 2013 10:51:17 EDT Ventricular Rate:  63 PR Interval:  206 QRS Duration: 75 QT Interval:  406 QTC Calculation: 416 R Axis:   76 Text Interpretation:  Sinus rhythm  Borderline low voltage, extremity leads  Baseline wander in lead(s) V1 V6 Confirmed by Zenia Resides  MD, Nochum Fenter ([id number])  on 12/27/2013 3:18:10 PM      MDM   Final diagnoses:  None    Patient's work appears shows no evidence of infection. Patient's hemoglobin is stable. She still complains of whole-body weakness and will be admitted for observation    Leota Jacobsen, MD 12/27/13 [phone]

## 2013-12-27 NOTE — ED Notes (Signed)
Bed: WA20 Expected date:  Expected time:  Means of arrival:  Comments: 

## 2013-12-27 NOTE — Progress Notes (Signed)
Phoned ED to receive report, no one available at this time

## 2013-12-27 NOTE — Progress Notes (Signed)
  CARE MANAGEMENT ED NOTE 12/27/2013  Patient:  Whitney, [name]   Account Number:  [id number]  Date Initiated:  12/27/2013  Documentation initiated by:  Jackelyn Poling  Subjective/Objective Assessment:   58 yr  [place] covered [employer] pt c./o weakness, fatigue hx anemia, left rib cage pain, heart rate in triage irregular.     Subjective/Objective Assessment Detail:   CM consult for information about home health needed  Pt's daughter at bedside inquired about differences in home health and personal care services  Dtr informed cm pt needs someone for a few hours to come in to let her dog out, get her started for the morning, her coffee etc  Denied need for home health services after Cm reviewed medicare guidelines and Home health in details  pcp [name]  Pt and Daughter very appreciative of resource and questions answered.  Daughter states she goes to her mothers home every day for dinner set up and to let out her dog and stays with her on some nights  Daughter continues to work full time     Action/Plan:   ED Cm noted CM consult, Reviewed EPIC notes, spoke with pt & dtr, Discussed with them that [name] provides primary home health services & DME for [facility] covered patients Provided them with a list of private duty nursing services   Action/Plan Detail:   Anticipated DC Date:  12/28/2013     Status Recommendation to Physician:   Result of Recommendation:    Other ED Services  Consult [facility]  Other  Outpatient Services - Pt will follow up    Choice offered to / List presented to:  C-1 Patient     [place] arranged  HH-8 PCS/PERSONAL CARE SERVICES       Status of service:  Completed, signed off  ED Comments:   ED Comments Detail:  CM reviewed in details medicare guidelines, home health ([facility]) (length of stay in home, types of [facility] staff available, coverage, primary caregiver, up to 24 hrs before services [place] be started), Private  duty nursing (PDN-coverage, length of stay in the home types of staff available)  CM provided family with a list of PDNs

## 2013-12-27 NOTE — Progress Notes (Signed)
RN called ED to receive report (within 10 minutes of being notified that pt was being trasnported). No one was available to give report.

## 2013-12-27 NOTE — ED Notes (Signed)
Pt here with weakness and fatigue.  Pt has hx of anemia and went to MD.  Pt had labs drawn Thursday and iron was low.  Was to see hematologist but appt time would be too delayed.  Pt has no s/sx of bleeding acutely.  Stools normal and no upper GI bleed noted.  Pt also c/o left rib cage pain.  Pt heart rate in triage irregular.  EKG to be completed.

## 2013-12-27 NOTE — H&P (Signed)
[facility] History and Physical  [name] RXV:[id number] DOB: [birth year] DOA: 12/27/2013  Referring physician: EDP PCP: Gerrit Heck, MD   Chief Complaint: generalized weakness since 6 weeks.  HPI: [name] is a 80 y.o. female with multiple medical problems including chronic diastolic heart failure, depression, GERD, hypertension, was brought in by her daughter for worsening weakness over the last 6 weeks, associated with decreased appetite. She denies nausea, vomiting , fever or chills. No weight loss. No chest pain, sob, cough , palpitations, . She was brought to ED, and her labs showed elevated BUN,  And hemoglobin of 8.9. She was referred to medical service for further evaluation and management. As per the daughter at bedside, pt had an episode of disorientation/ confusion on Friday evening. Pt is a poor historian and most of the history was avaialble from the daughter at bedside.    Review of Systems:  See HPI otherwise negative.   Past Medical History  Diagnosis Date  . ANEMIA 06/30/2007  . COLONIC POLYPS, HX OF 01/21/2007  . COLONIC POLYPS, HX OF 01/21/2007  . DEGENERATIVE JOINT DISEASE 05/19/2007  . DEPRESSION 02/07/2010  . DIASTOLIC HEART FAILURE, CHRONIC 02/27/2009  . DIVERTICULOSIS, COLON 01/21/2007  . DYSPNEA 11/13/2009  . Edema 11/13/2009  . GERD 01/21/2007  . HIATAL HERNIA WITH REFLUX 01/21/2007  . HYPERTENSION 01/21/2007  . LIMB PAIN 11/13/2009  . NECK PAIN, CHRONIC 02/13/2010  . Pure hypercholesterolemia 04/05/2008  . PONV (postoperative nausea and vomiting)     Pt had problems with memory after procedure  . Colitis     Hx: of  . Pneumonia   . Bronchitis     Hx: of  . FHx: allergies     Hx;of  . Heart palpitations     Hx: of  . Headache(784.0)   . CORONARY ARTERY DISEASE 04/05/2008    BMS to RCA 10/2007  . TIA (transient ischemic attack)    Past Surgical History  Procedure Laterality Date  . Appendectomy    . Cholecystectomy    . Abdominal  hysterectomy    . Lumbar laminectomy    . Total knee arthroplasty      3 left knee replacements, 2 right knee replacements  . Rotator cuff repair    . Cardiac stent    . Neck surgery    . Cataract extraction w/ intraocular lens  implant, bilateral      Hx: of  . Cardiac catheterization      2009  . Tonsillectomy    . Dilation and curettage of uterus    . Patellectomy Right 09/14/2013    DR Marlou Sa  . Patellectomy Right 09/14/2013    Procedure: PATELLECTOMY;  Surgeon: Meredith Pel, MD;  Location: [facility];  Service: Orthopedics;  Laterality: Right;   Social History:  reports that she quit smoking about 46 years ago. She has never used smokeless tobacco. She reports that she does not drink alcohol or use illicit drugs.  No Known Allergies  Family History  Problem Relation Age of Onset  . Osteoarthritis Mother   . Heart failure Mother      Prior to Admission medications   Medication Sig Start Date End Date Taking? Authorizing Provider  Ascorbic Acid (VITAMIN C) 500 MG CAPS Take 1 tablet by mouth daily.    Yes Historical Provider, MD  baclofen (LIORESAL) 10 MG tablet Take 10 mg by mouth 3 (three) times daily.   Yes Historical Provider, MD  benazepril (LOTENSIN) 40 MG tablet  Take 0.5 tablets (20 mg total) by mouth daily. 09/18/13  Yes Adeline Saralyn Pilar, MD  benzonatate (TESSALON) 100 MG capsule Take 100 mg by mouth 3 (three) times daily as needed for cough.   Yes Historical Provider, MD  cholecalciferol (VITAMIN D) 1000 UNITS tablet Take 1,000 Units by mouth daily.   Yes Historical Provider, MD  ferrous sulfate 325 (65 FE) MG tablet Take 325 mg by mouth daily with breakfast.    Yes Historical Provider, MD  gabapentin (NEURONTIN) 600 MG tablet Take 600 mg by mouth daily.   Yes Historical Provider, MD  HYDROcodone-acetaminophen (NORCO) 5-325 MG per tablet Take 1-2 tablets by mouth every 6 (six) hours as needed for moderate pain. 09/20/13  Yes Meredith Pel, MD  LORazepam (ATIVAN) 0.5 MG  tablet Take 0.5 mg by mouth every 8 (eight) hours as needed for anxiety.   Yes Historical Provider, MD  metoprolol (LOPRESSOR) 50 MG tablet Take 1 tablet (50 mg total) by mouth 2 (two) times daily. 09/02/13  Yes Marletta Lor, MD  Multiple Vitamins-Minerals (MULTIVITAMIN WITH MINERALS) tablet Take 1 tablet by mouth daily.   Yes Historical Provider, MD  nitroGLYCERIN (NITROSTAT) 0.4 MG SL tablet Place 0.4 mg under the tongue every 5 (five) minutes as needed for chest pain.    Yes Historical Provider, MD  sertraline (ZOLOFT) 50 MG tablet Take 1 tablet (50 mg total) by mouth daily. 09/02/13  Yes Marletta Lor, MD  simvastatin (ZOCOR) 40 MG tablet Take 1 tablet (40 mg total) by mouth at bedtime. 09/02/13  Yes Marletta Lor, MD  spironolactone (ALDACTONE) 25 MG tablet Take 1 tablet (25 mg total) by mouth daily. 08/19/13  Yes Marletta Lor, MD  traMADol (ULTRAM) 50 MG tablet Take 50 mg by mouth every 8 (eight) hours.    Yes Historical Provider, MD  traZODone (DESYREL) 100 MG tablet Take 50 mg by mouth at bedtime.   Yes Historical Provider, MD  triamcinolone cream (KENALOG) 0.1 % Apply 1 application topically 2 (two) times daily as needed (Eczema on legs).   Yes Historical Provider, MD  omeprazole (PRILOSEC) 40 MG capsule Take 1 capsule (40 mg total) by mouth daily. 07/22/12 09/01/13  Marletta Lor, MD   Physical Exam: Filed Vitals:   12/27/13 1634  BP: 167/74  Pulse: 62  Temp: 98.3 F (36.8 C)  Resp: 18    BP 167/74  Pulse 62  Temp(Src) 98.3 F (36.8 C) (Oral)  Resp 18  SpO2 98%  General:  Appears calm and comfortable Eyes: PERRL, normal lids, irises & conjunctiva Neck: no LAD, masses or thyromegaly Cardiovascular: RRR, no m/r/g. No LE edema. Telemetry: SR, no arrhythmias  Respiratory: CTA bilaterally, no w/r/r. Normal respiratory effort. Abdomen: soft, ntnd Skin: no rash or induration seen on limited exam Musculoskeletal: grossly normal tone BUE/BLE Neurologic:  alert, slightly confused, speech normal, no facial droop and able to move all extremities. .          Labs on Admission:  Basic Metabolic Panel:  Recent Labs Lab 12/27/13 1107  NA 137  K 5.3  CL 99  CO2 24  GLUCOSE 84  BUN 24*  CREATININE 1.06  CALCIUM 9.3   Liver Function Tests:  Recent Labs Lab 12/27/13 1107  AST 32  ALT 10  ALKPHOS 74  BILITOT <0.2*  PROT 7.4  ALBUMIN 3.5   No results found for this basename: LIPASE, AMYLASE,  in the last 168 hours No results found for this basename:  AMMONIA,  in the last 168 hours CBC:  Recent Labs Lab 12/27/13 1107  WBC 7.1  NEUTROABS 4.0  HGB 8.9*  HCT 31.1*  MCV 77.0*  PLT 236   Cardiac Enzymes:  Recent Labs Lab 12/27/13 1107  TROPONINI <0.30    BNP (last 3 results)  Recent Labs  09/15/13 1640 12/27/13 1107  PROBNP 1858.0* 1267.0*   CBG: No results found for this basename: GLUCAP,  in the last 168 hours  Radiological Exams on Admission: Dg Chest 2 View  12/27/2013   CLINICAL DATA:  Twitching of arms and legs with history of hypertension.  EXAM: CHEST  2 VIEW  COMPARISON:  PA and lateral chest of September 08, 2013.  FINDINGS: The lungs are adequately inflated and clear. The cardiac silhouette is top-normal in size. The pulmonary vascularity is not engorged. There is no pleural effusion. There is mild compression of the body of approximately T12 which appears new. The loss of height is approximately 30% anteriorly.  IMPRESSION: There is no acute cardiopulmonary abnormality. Partial L3 wedge compression has occurred since the previous study.   Electronically Signed   By: David  Martinique   On: 12/27/2013 12:51   Ct Head Wo Contrast  12/27/2013   CLINICAL DATA:  80 year old female with weakness, fatigue, headache, pain, any knee. Initial encounter.  EXAM: CT HEAD WITHOUT CONTRAST  TECHNIQUE: Contiguous axial images were obtained from the base of the skull through the vertex without intravenous contrast.   COMPARISON:  Head CT without contrast 07/14/2007.  FINDINGS: Mastoids remain clear. New fluid level in the right sphenoid sinus. Other visible paranasal sinuses are clear. No acute osseous abnormality identified.  No acute orbit or scalp soft tissue findings.  Calcified atherosclerosis at the skull base. Cerebral volume loss since 2008, appears generalized. No suspicious intracranial vascular hyperdensity. Small hypodense focus in the central right thalamus is chronic (series 2, image 14 today and series 2, image 16 previously). Elsewhere normal for age gray-white matter differentiation. No midline shift, ventriculomegaly, mass effect, evidence of mass lesion, intracranial hemorrhage or evidence of cortically based acute infarction.  IMPRESSION: 1. No acute intracranial abnormality. Stable small chronic lacunar infarct in the thalamus. 2. New right sphenoid sinus fluid level, [place] represent mild sinusitis. Other visible paranasal sinuses and mastoids are clear.   Electronically Signed   By: Lars Pinks M.D.   On: 12/27/2013 15:05    EKG: sinus rhythm  Assessment/Plan Active Problems:   ANEMIA   HYPERTENSION   DIASTOLIC HEART FAILURE, CHRONIC   GERD   Dehydration   Weakness   Generalized weakness: - admit to med surg.  - get CK, tsh, hgba1c, stool for occult blood.  - vitamin D levels.  Nutrition consulted and PT eval ordered.   Hypertension: Controlled.   Diastolic heart failure: Appears compensated.   Depression: On zoloft.   Anemia: At baseline.  - stool for occult blood.  - iron deficiency , on iron supplementation.  - will call hematology in am.    An episode of disorientation/ confusion:  On Friday. MRi brain ordered.   DVT prophylaxis.  - .   Code Status: full code.  Family Communication: discussed with daughter at bedside Disposition Plan: admit to med surg.   Time spent: 75  minutes  [facility] Pager [phone]  **Disclaimer: This note Bou  have been dictated with voice recognition software. Similar sounding words can inadvertently be transcribed and this note [place] contain transcription errors which [place] not have been  corrected upon publication of note.**

## 2013-12-27 NOTE — Telephone Encounter (Signed)
LEFT MESSAGE FOR PATIENT FOR TO RETURN CALL TO SCHEDULE NP APPT.

## 2013-12-28 ENCOUNTER — Telehealth: Payer: Self-pay | Admitting: Hematology and Oncology

## 2013-12-28 ENCOUNTER — Observation Stay (HOSPITAL_COMMUNITY): Payer: Medicare HMO

## 2013-12-28 LAB — HEMOGLOBIN A1C
Hgb A1c MFr Bld: 5.8 % — ABNORMAL HIGH (ref <5.7)
Mean Plasma Glucose: 120 mg/dL — ABNORMAL HIGH (ref <117)

## 2013-12-28 LAB — BASIC METABOLIC PANEL
BUN: 16 mg/dL (ref 6–23)
CO2: 27 mEq/L (ref 19–32)
Calcium: 8.8 mg/dL (ref 8.4–10.5)
Chloride: 105 mEq/L (ref 96–112)
Creatinine, Ser: 0.88 mg/dL (ref 0.50–1.10)
GFR calc Af Amer: 70 mL/min — ABNORMAL LOW (ref >90)
GFR calc non Af Amer: 60 mL/min — ABNORMAL LOW (ref >90)
Glucose, Bld: 93 mg/dL (ref 70–99)
Potassium: 4.6 mEq/L (ref 3.7–5.3)
Sodium: 142 mEq/L (ref 137–147)

## 2013-12-28 LAB — CBC
HCT: 28.9 % — ABNORMAL LOW (ref 36.0–46.0)
Hemoglobin: 8.3 g/dL — ABNORMAL LOW (ref 12.0–15.0)
MCH: 22.2 pg — ABNORMAL LOW (ref 26.0–34.0)
MCHC: 28.7 g/dL — ABNORMAL LOW (ref 30.0–36.0)
MCV: 77.3 fL — ABNORMAL LOW (ref 78.0–100.0)
Platelets: 200 10*3/uL (ref 150–400)
RBC: 3.74 MIL/uL — ABNORMAL LOW (ref 3.87–5.11)
RDW: 20.3 % — ABNORMAL HIGH (ref 11.5–15.5)
WBC: 5.6 10*3/uL (ref 4.0–10.5)

## 2013-12-28 LAB — OCCULT BLOOD X 1 CARD TO LAB, STOOL: Fecal Occult Bld: POSITIVE — AB

## 2013-12-28 LAB — TSH: TSH: 2.25 u[IU]/mL (ref 0.350–4.500)

## 2013-12-28 LAB — VITAMIN D 25 HYDROXY (VIT D DEFICIENCY, FRACTURES): Vit D, 25-Hydroxy: 53 ng/mL (ref 30–89)

## 2013-12-28 MED ORDER — ONDANSETRON HCL 4 MG/2ML IJ SOLN
4.0000 mg | Freq: Four times a day (QID) | INTRAMUSCULAR | Status: DC | PRN
  Administered 2013-12-28 – 2013-12-30 (×3): 4 mg via INTRAVENOUS
  Filled 2013-12-28 (×3): qty 2

## 2013-12-28 MED ORDER — ENSURE COMPLETE PO LIQD
237.0000 mL | Freq: Two times a day (BID) | ORAL | Status: DC
  Administered 2013-12-28 – 2013-12-30 (×4): 237 mL via ORAL

## 2013-12-28 MED ORDER — HYDROCODONE-ACETAMINOPHEN 5-325 MG PO TABS
1.0000 | ORAL_TABLET | ORAL | Status: DC | PRN
  Administered 2013-12-28 – 2013-12-30 (×7): 1 via ORAL
  Filled 2013-12-28 (×7): qty 1

## 2013-12-28 MED ORDER — ACETAMINOPHEN 325 MG PO TABS: 650.0000 mg | ORAL_TABLET | Freq: Four times a day (QID) | ORAL | Status: DC | PRN

## 2013-12-28 MED ORDER — FLUTICASONE PROPIONATE 50 MCG/ACT NA SUSP
1.0000 | Freq: Every day | NASAL | Status: DC
  Administered 2013-12-28 – 2013-12-30 (×3): 1 via NASAL
  Filled 2013-12-28: qty 16

## 2013-12-28 MED ORDER — PROCHLORPERAZINE EDISYLATE 5 MG/ML IJ SOLN
10.0000 mg | Freq: Once | INTRAMUSCULAR | Status: AC
  Administered 2013-12-28: 10 mg via INTRAVENOUS
  Filled 2013-12-28: qty 2

## 2013-12-28 MED ORDER — SPIRONOLACTONE 25 MG PO TABS
25.0000 mg | ORAL_TABLET | Freq: Every day | ORAL | Status: DC
  Administered 2013-12-28 – 2013-12-30 (×3): 25 mg via ORAL
  Filled 2013-12-28 (×3): qty 1

## 2013-12-28 MED ORDER — GABAPENTIN 300 MG PO CAPS
600.0000 mg | ORAL_CAPSULE | Freq: Every day | ORAL | Status: DC
  Administered 2013-12-28: 600 mg via ORAL
  Filled 2013-12-28: qty 2

## 2013-12-28 MED ORDER — LORAZEPAM 0.5 MG PO TABS
0.5000 mg | ORAL_TABLET | Freq: Every day | ORAL | Status: DC
  Administered 2013-12-28 – 2013-12-29 (×2): 0.5 mg via ORAL
  Filled 2013-12-28 (×2): qty 1

## 2013-12-28 MED ORDER — GABAPENTIN 300 MG PO CAPS
600.0000 mg | ORAL_CAPSULE | Freq: Every day | ORAL | Status: DC
  Administered 2013-12-29: 600 mg via ORAL
  Filled 2013-12-28 (×2): qty 2

## 2013-12-28 MED ORDER — METOPROLOL TARTRATE 50 MG PO TABS
50.0000 mg | ORAL_TABLET | Freq: Two times a day (BID) | ORAL | Status: DC
  Administered 2013-12-28 – 2013-12-30 (×5): 50 mg via ORAL
  Filled 2013-12-28 (×6): qty 1

## 2013-12-28 MED ORDER — TRAMADOL HCL 50 MG PO TABS
50.0000 mg | ORAL_TABLET | Freq: Four times a day (QID) | ORAL | Status: DC | PRN
  Administered 2013-12-28 – 2013-12-29 (×2): 50 mg via ORAL
  Filled 2013-12-28 (×2): qty 1

## 2013-12-28 MED ORDER — BACLOFEN 10 MG PO TABS
10.0000 mg | ORAL_TABLET | Freq: Two times a day (BID) | ORAL | Status: DC | PRN
  Filled 2013-12-28: qty 1

## 2013-12-28 NOTE — Progress Notes (Signed)
Came to visit patient at bedside to offer [facility] services. Daughter, Lawson Fiscal was at bedside. Consents were signed at bedside. Explained that patient will receive post hospital discharge call and will be evaluated for monthly home visits. Left [facility] Care Management packet at bedside along with contact information. Patient's daughter really appreciative of visit. Will make inpatient RNCM aware of bedside visit.  Myra Rude, MSN- [facility] [phone]

## 2013-12-28 NOTE — Progress Notes (Signed)
PT Cancellation Note  Patient Details Name: Rashia Mckesson Chewning MRN: [id number] DOB: [birth year]   Cancelled Treatment:     PT deferred this am at request of RN - pt with "massive headache and nausea"  Will follow.   BRADSHAW,HUNTER 12/28/2013, 11:19 AM

## 2013-12-28 NOTE — Progress Notes (Signed)
PT Cancellation Note  Patient Details Name: [name] Poke MRN: [id number] DOB: 06/19/1930   Cancelled Treatment:     PT reattempted; Pt for MRI.  Will follow   BRADSHAW,HUNTER 12/28/2013, 3:15 PM

## 2013-12-28 NOTE — Telephone Encounter (Signed)
LEFT MESSAGE FOR PATIENT TO RETURN CALL TO SCHEDULE NP APPT.  °

## 2013-12-28 NOTE — Progress Notes (Signed)
INITIAL NUTRITION ASSESSMENT  DOCUMENTATION CODES Per approved criteria  -Not Applicable   INTERVENTION: -Recommend vanilla or strawberry Ensure Complete po BID, each supplement provides 350 kcal and 13 grams of protein -Provided nutrition education handouts -Will continue to monitor  NUTRITION DIAGNOSIS: Inadequate oral intake related to early satiety as evidenced by PO intake <75%, 20-30 lbs wt loss.   Goal: Pt to meet >/= 90% of their estimated nutrition needs    Monitor:  Total protein/energy intake, labs, weights  Reason for Assessment: MST/Consult to Assess  80 y.o. female  Admitting Dx: <principal problem not specified>  ASSESSMENT: [name] is a 80 y.o. female with multiple medical problems including chronic diastolic heart failure, depression, GERD, hypertension, was brought in by her daughter for worsening weakness over the last 6 weeks, associated with decreased appetite  -Pt's daughter reported decreased appetite for past 2-3 years. Has experienced an unintentional 20-30 lbs in past one year (13.8% body weight loss, non-severe for time frame) -Daughter frequently encourages pt to consume small frequent meals of high iron foods, noted that pt does not eat if she is not there to encourage meals. Diet recall indicates pt consume 25% of shredded wheat cereals w/fruit for snacks, green vegetables, and red meats. -Has started on Boost supplements on Sunday to encourage pt. Pt enjoys vanilla or strawberry flavors only, will order Ensure as [facility] only with chocolate Boost availability -Provided pt and pt's daughter with "Increasing Calories and Protein" handout from the [employer], and discussed ways to improve pt's early satiety -Encouraged use of whey protein, smoothies and milkshakes for easy method for nutrient intake. Provided pt with list of smoothie/milkshake recipes ideas -Current PO intake 0%, pt skipped breakfast d/t nausea and  headache  Height: Ht Readings from Last 1 Encounters:  12/21/13 5\' 3"  (1.6 m)    Weight: Wt Readings from Last 1 Encounters:  12/21/13 186 lb (84.369 kg)    Ideal Body Weight: 115 lbs  % Ideal Body Weight: 162%  Wt Readings from Last 10 Encounters:  12/21/13 186 lb (84.369 kg)  09/20/13 182 lb 4.8 oz (82.69 kg)  09/20/13 182 lb 4.8 oz (82.69 kg)  09/08/13 182 lb 4.8 oz (82.691 kg)  08/02/13 186 lb (84.369 kg)  07/07/13 188 lb (85.276 kg)  06/15/13 190 lb (86.183 kg)  03/17/13 183 lb (83.008 kg)  03/10/13 187 lb (84.823 kg)  02/10/13 181 lb (82.101 kg)    Usual Body Weight: 206-216 lbs  % Usual Body Weight: 86-90%  BMI:  There is no weight on file to calculate BMI.  Estimated Nutritional Needs: Kcal: 1650-1850 Protein: 85-95 gram Fluid: >/=1500 ml/daily  Skin: WDL  Diet Order: Sodium Restricted  EDUCATION NEEDS: -Education needs addressed   Intake/Output Summary (Last 24 hours) at 12/28/13 1138 Last data filed at 12/28/13 1029  Gross per 24 hour  Intake    240 ml  Output      0 ml  Net    240 ml    Last BM: 6/15   Labs:   Recent Labs Lab 12/27/13 1107 12/28/13 0515  NA 137 142  K 5.3 4.6  CL 99 105  CO2 24 27  BUN 24* 16  CREATININE 1.06 0.88  CALCIUM 9.3 8.8  GLUCOSE 84 93    CBG (last 3)  No results found for this basename: GLUCAP,  in the last 72 hours  Scheduled Meds: . benazepril  20 mg Oral Daily  . enoxaparin (LOVENOX)  injection  40 mg Subcutaneous Q24H  . ferrous sulfate  325 mg Oral Q breakfast  . fluticasone  1 spray Each Nare Daily  . gabapentin  600 mg Oral Daily  . sertraline  50 mg Oral Daily  . simvastatin  40 mg Oral QHS  . traZODone  50 mg Oral QHS    Continuous Infusions: . sodium chloride 75 mL/hr at 12/28/13 9038    Past Medical History  Diagnosis Date  . ANEMIA 06/30/2007  . COLONIC POLYPS, HX OF 01/21/2007  . COLONIC POLYPS, HX OF 01/21/2007  . DEGENERATIVE JOINT DISEASE 05/19/2007  . DEPRESSION  02/07/2010  . DIASTOLIC HEART FAILURE, CHRONIC 02/27/2009  . DIVERTICULOSIS, COLON 01/21/2007  . DYSPNEA 11/13/2009  . Edema 11/13/2009  . GERD 01/21/2007  . HIATAL HERNIA WITH REFLUX 01/21/2007  . HYPERTENSION 01/21/2007  . LIMB PAIN 11/13/2009  . NECK PAIN, CHRONIC 02/13/2010  . Pure hypercholesterolemia 04/05/2008  . PONV (postoperative nausea and vomiting)     Pt had problems with memory after procedure  . Colitis     Hx: of  . Pneumonia   . Bronchitis     Hx: of  . FHx: allergies     Hx;of  . Heart palpitations     Hx: of  . Headache(784.0)   . CORONARY ARTERY DISEASE 04/05/2008    BMS to RCA 10/2007  . TIA (transient ischemic attack)     Past Surgical History  Procedure Laterality Date  . Appendectomy    . Cholecystectomy    . Abdominal hysterectomy    . Lumbar laminectomy    . Total knee arthroplasty      3 left knee replacements, 2 right knee replacements  . Rotator cuff repair    . Cardiac stent    . Neck surgery    . Cataract extraction w/ intraocular lens  implant, bilateral      Hx: of  . Cardiac catheterization      2009  . Tonsillectomy    . Dilation and curettage of uterus    . Patellectomy Right 09/14/2013    DR Marlou Sa  . Patellectomy Right 09/14/2013    Procedure: PATELLECTOMY;  Surgeon: Meredith Pel, MD;  Location: [facility];  Service: Orthopedics;  Laterality: Right;    Atlee Abide MS RD [place] Clinical Dietitian [phone]

## 2013-12-28 NOTE — Progress Notes (Signed)
UR Completed.  Vergie Living [id number] 12/28/2013

## 2013-12-28 NOTE — Progress Notes (Signed)
[facility] PROGRESS NOTE  [name] WEX:[id number] DOB: 02/25/1930 DOA: 12/27/2013 PCP: Gerrit Heck, MD  Assessment/Plan: 1-Generalized weakness, tremors , headaches.  -could be related to multiple medications, doses changes.  -CT head with old infarct. MRI pending.  -Seizure is in the differential. Will consult neurology for further evaluation.  -her HB is at baseline.  -CK normal at 38,  TSH normal at 2.2. - vitamin D levels: 1-25 dihydroxy pending. 25 hydroxy at 53 normal.  - PT eval ordered.   Hypertension:  Resume metoprolol and spironolactone.  Continue with benazepril.   Diastolic heart failure:  Appears compensated. NSL fluids.   Depression:  On zoloft.   Anemia:  At baseline. Prior HB 8 to 8.9.  - stool for occult blood ordered.   - iron deficiency , on iron supplementation.  -will check anemia panel.    Code Status: Full code.  Family Communication: Care discussed with daughter who was at bedside.  Disposition Plan: Remain in hospital for further evaluation.    Consultants:  Neurology  Procedures:  none  Antibiotics:  none  HPI/Subjective: Per daughter patient stared to have jerking movement after she was started on Cymbalta. After stopping cymbalta jerking movement has decreased in frequency. Patient with progressive weakness, using now wheelchair. She is dropping things from hands more frequently. She had multiple medications changes, gabapentin decrease to 600 mg daily, ativan was change to TID PRN. Since patient has been on ativan she has been more sleepy.   Objective: Filed Vitals:   12/28/13 1028  BP: 159/70  Pulse: 79  Temp: 97.9 F (36.6 C)  Resp: 16    Intake/Output Summary (Last 24 hours) at 12/28/13 1203 Last data filed at 12/28/13 1029  Gross per 24 hour  Intake    240 ml  Output      0 ml  Net    240 ml   There were no vitals filed for this visit.  Exam:   General:  Alert, close eyes at times.    Cardiovascular: S 1, S 2 RRR  Respiratory: CTA  Abdomen: Bs present, soft, NT  Musculoskeletal: trace edema.   Neuro; speech clear, no tremors, good motor strenght upper extremities, mild BL LE weakness.   Data Reviewed: Basic Metabolic Panel:  Recent Labs Lab 12/27/13 1107 12/28/13 0515  NA 137 142  K 5.3 4.6  CL 99 105  CO2 24 27  GLUCOSE 84 93  BUN 24* 16  CREATININE 1.06 0.88  CALCIUM 9.3 8.8   Liver Function Tests:  Recent Labs Lab 12/27/13 1107  AST 32  ALT 10  ALKPHOS 74  BILITOT <0.2*  PROT 7.4  ALBUMIN 3.5   No results found for this basename: LIPASE, AMYLASE,  in the last 168 hours No results found for this basename: AMMONIA,  in the last 168 hours CBC:  Recent Labs Lab 12/27/13 1107 12/28/13 0515  WBC 7.1 5.6  NEUTROABS 4.0  --   HGB 8.9* 8.3*  HCT 31.1* 28.9*  MCV 77.0* 77.3*  PLT 236 200   Cardiac Enzymes:  Recent Labs Lab 12/27/13 1107 12/27/13 1836  CKTOTAL  --  67  TROPONINI <0.30  --    BNP (last 3 results)  Recent Labs  09/15/13 1640 12/27/13 1107  PROBNP 1858.0* 1267.0*   CBG: No results found for this basename: GLUCAP,  in the last 168 hours  No results found for this or any previous visit (from the past 240 hour(s)).   Studies: Dg  Chest 2 View  12/27/2013   CLINICAL DATA:  Twitching of arms and legs with history of hypertension.  EXAM: CHEST  2 VIEW  COMPARISON:  PA and lateral chest of September 08, 2013.  FINDINGS: The lungs are adequately inflated and clear. The cardiac silhouette is top-normal in size. The pulmonary vascularity is not engorged. There is no pleural effusion. There is mild compression of the body of approximately T12 which appears new. The loss of height is approximately 30% anteriorly.  IMPRESSION: There is no acute cardiopulmonary abnormality. Partial L3 wedge compression has occurred since the previous study.   Electronically Signed   By: David  Martinique   On: 12/27/2013 12:51   Ct Head Wo  Contrast  12/27/2013   CLINICAL DATA:  80 year old female with weakness, fatigue, headache, pain, any knee. Initial encounter.  EXAM: CT HEAD WITHOUT CONTRAST  TECHNIQUE: Contiguous axial images were obtained from the base of the skull through the vertex without intravenous contrast.  COMPARISON:  Head CT without contrast 07/14/2007.  FINDINGS: Mastoids remain clear. New fluid level in the right sphenoid sinus. Other visible paranasal sinuses are clear. No acute osseous abnormality identified.  No acute orbit or scalp soft tissue findings.  Calcified atherosclerosis at the skull base. Cerebral volume loss since 2008, appears generalized. No suspicious intracranial vascular hyperdensity. Small hypodense focus in the central right thalamus is chronic (series 2, image 14 today and series 2, image 16 previously). Elsewhere normal for age gray-white matter differentiation. No midline shift, ventriculomegaly, mass effect, evidence of mass lesion, intracranial hemorrhage or evidence of cortically based acute infarction.  IMPRESSION: 1. No acute intracranial abnormality. Stable small chronic lacunar infarct in the thalamus. 2. New right sphenoid sinus fluid level, Zarr represent mild sinusitis. Other visible paranasal sinuses and mastoids are clear.   Electronically Signed   By: Lars Pinks M.D.   On: 12/27/2013 15:05    Scheduled Meds: . benazepril  20 mg Oral Daily  . enoxaparin (LOVENOX) injection  40 mg Subcutaneous Q24H  . ferrous sulfate  325 mg Oral Q breakfast  . fluticasone  1 spray Each Nare Daily  . gabapentin  600 mg Oral Daily  . metoprolol  50 mg Oral BID  . sertraline  50 mg Oral Daily  . simvastatin  40 mg Oral QHS  . spironolactone  25 mg Oral Daily  . traZODone  50 mg Oral QHS   Continuous Infusions:   Active Problems:   ANEMIA   HYPERTENSION   DIASTOLIC HEART FAILURE, CHRONIC   GERD   Dehydration   Weakness    Time spent: 35 minutes.     Niel Hummer A  [facility] Pager [phone]. If 7PM-7AM, please contact night-coverage at [email or web address], password [email or web address] 12/28/2013, 12:03 PM  LOS: 1 day

## 2013-12-28 NOTE — Consult Note (Addendum)
NEURO HOSPITALIST CONSULT NOTE    Reason for Consult: 6+ week history of worsening weakness   HPI:                                                                                                                                          [name] is an 80 y.o. female who was recently seen by Dr. Janann Colonel of [place] on 12/21/2013.  Per his note "patient describes jerking movements of the trunk and her extremities. She had surgery 09/14/2013, has had brief loss of muscle tone (?asterixis) immediately after surgery, this has continued since surgery. Was severe after surgery and then has gotten progressively better. Around 1 month ago was started on Cymbalta which caused generalized jerking episodes of her trunk and wekness, the cymbalta was stopped and the generlaized jerking episodes have resolved. Continues to have episodes of arm dropping, where she is unable to keep her hand up. Lasts a few seconds and then she is able to move her extremities normally. Notes she is dropping items due to the "muscle drops".   Patient describes similar story to myself.  She states he decreased her Neurontin from TID to BID.  While her myoclonus did decrease she had one episode of jerking on Friday night.  She continues to feel "Generally weak all over to the point she is now only using wheel chair at home. She states she does not take Norco frequently, has started taking Ativan BID since past Friday, does take her ultram.  She denies any other changes in her medications.   Daughter feels this Kosel be polypharmacy.  She notes the weakness has been ongoing for > 6 weeks but 6 weeks as soon as cymbalta was added which caused patient to have sever twitching.  Cymbalta was D/C'd--twitching stopped. Friday morning she seemed to be struggling with words and had on large jerking action. Per daughter, today she was doing fine. In discussion, it was noted since she has stopped PT at home (due to feeling tired) it is  clear she has become less active and sleeps all day. Patients main complaints is that she "just does not have energy".     CK normal B 12 pending TSH normal UA negative Vit D normal   Past Medical History  Diagnosis Date  . ANEMIA 06/30/2007  . COLONIC POLYPS, HX OF 01/21/2007  . COLONIC POLYPS, HX OF 01/21/2007  . DEGENERATIVE JOINT DISEASE 05/19/2007  . DEPRESSION 02/07/2010  . DIASTOLIC HEART FAILURE, CHRONIC 02/27/2009  . DIVERTICULOSIS, COLON 01/21/2007  . DYSPNEA 11/13/2009  . Edema 11/13/2009  . GERD 01/21/2007  . HIATAL HERNIA WITH REFLUX 01/21/2007  . HYPERTENSION 01/21/2007  . LIMB PAIN 11/13/2009  . NECK PAIN, CHRONIC 02/13/2010  . Pure hypercholesterolemia 04/05/2008  .  PONV (postoperative nausea and vomiting)     Pt had problems with memory after procedure  . Colitis     Hx: of  . Pneumonia   . Bronchitis     Hx: of  . FHx: allergies     Hx;of  . Heart palpitations     Hx: of  . Headache(784.0)   . CORONARY ARTERY DISEASE 04/05/2008    BMS to RCA 10/2007  . TIA (transient ischemic attack)     Past Surgical History  Procedure Laterality Date  . Appendectomy    . Cholecystectomy    . Abdominal hysterectomy    . Lumbar laminectomy    . Total knee arthroplasty      3 left knee replacements, 2 right knee replacements  . Rotator cuff repair    . Cardiac stent    . Neck surgery    . Cataract extraction w/ intraocular lens  implant, bilateral      Hx: of  . Cardiac catheterization      2009  . Tonsillectomy    . Dilation and curettage of uterus    . Patellectomy Right 09/14/2013    DR Marlou Sa  . Patellectomy Right 09/14/2013    Procedure: PATELLECTOMY;  Surgeon: Meredith Pel, MD;  Location: [facility];  Service: Orthopedics;  Laterality: Right;    Family History  Problem Relation Age of Onset  . Osteoarthritis Mother   . Heart failure Mother      Social History:  reports that she quit smoking about 46 years ago. She has never used smokeless tobacco. She reports that  she does not drink alcohol or use illicit drugs.  No Known Allergies  MEDICATIONS:                                                                                                                     Prior to Admission:  Prescriptions prior to admission  Medication Sig Dispense Refill  . Ascorbic Acid (VITAMIN C) 500 MG CAPS Take 1 tablet by mouth daily.       . baclofen (LIORESAL) 10 MG tablet Take 10 mg by mouth 3 (three) times daily.      . benazepril (LOTENSIN) 40 MG tablet Take 0.5 tablets (20 mg total) by mouth daily.      . benzonatate (TESSALON) 100 MG capsule Take 100 mg by mouth 3 (three) times daily as needed for cough.      . cholecalciferol (VITAMIN D) 1000 UNITS tablet Take 1,000 Units by mouth daily.      . ferrous sulfate 325 (65 FE) MG tablet Take 325 mg by mouth daily with breakfast.       . gabapentin (NEURONTIN) 600 MG tablet Take 600 mg by mouth daily.      Marland Kitchen HYDROcodone-acetaminophen (NORCO) 5-325 MG per tablet Take 1-2 tablets by mouth every 6 (six) hours as needed for moderate pain.  60 tablet  0  . LORazepam (ATIVAN) 0.5 MG tablet Take 0.5 mg by mouth every  8 (eight) hours as needed for anxiety.      . metoprolol (LOPRESSOR) 50 MG tablet Take 1 tablet (50 mg total) by mouth 2 (two) times daily.  180 tablet  3  . Multiple Vitamins-Minerals (MULTIVITAMIN WITH MINERALS) tablet Take 1 tablet by mouth daily.      . nitroGLYCERIN (NITROSTAT) 0.4 MG SL tablet Place 0.4 mg under the tongue every 5 (five) minutes as needed for chest pain.       Marland Kitchen sertraline (ZOLOFT) 50 MG tablet Take 1 tablet (50 mg total) by mouth daily.  90 tablet  1  . simvastatin (ZOCOR) 40 MG tablet Take 1 tablet (40 mg total) by mouth at bedtime.  90 tablet  3  . spironolactone (ALDACTONE) 25 MG tablet Take 1 tablet (25 mg total) by mouth daily.  90 tablet  3  . traMADol (ULTRAM) 50 MG tablet Take 50 mg by mouth every 8 (eight) hours.       . traZODone (DESYREL) 100 MG tablet Take 50 mg by mouth at  bedtime.      . triamcinolone cream (KENALOG) 0.1 % Apply 1 application topically 2 (two) times daily as needed (Eczema on legs).      Marland Kitchen omeprazole (PRILOSEC) 40 MG capsule Take 1 capsule (40 mg total) by mouth daily.  90 capsule  3   Scheduled: . benazepril  20 mg Oral Daily  . enoxaparin (LOVENOX) injection  40 mg Subcutaneous Q24H  . ferrous sulfate  325 mg Oral Q breakfast  . fluticasone  1 spray Each Nare Daily  . gabapentin  600 mg Oral Daily  . metoprolol  50 mg Oral BID  . sertraline  50 mg Oral Daily  . simvastatin  40 mg Oral QHS  . spironolactone  25 mg Oral Daily  . traZODone  50 mg Oral QHS     ROS:                                                                                                                                       History obtained from the patient  General ROS: negative for - chills, fatigue, fever, night sweats, weight gain or weight loss Psychological ROS: negative for - behavioral disorder, hallucinations, memory difficulties, mood swings or suicidal ideation Ophthalmic ROS: negative for - blurry vision, double vision, eye pain or loss of vision ENT ROS: negative for - epistaxis, nasal discharge, oral lesions, sore throat, tinnitus or vertigo Allergy and Immunology ROS: negative for - hives or itchy/watery eyes Hematological and Lymphatic ROS: negative for - bleeding problems, bruising or swollen lymph nodes Endocrine ROS: negative for - galactorrhea, hair pattern changes, polydipsia/polyuria or temperature intolerance Respiratory ROS: negative for - cough, hemoptysis, shortness of breath or wheezing Cardiovascular ROS: negative for - chest pain, dyspnea on exertion, edema or irregular heartbeat Gastrointestinal ROS: negative for - abdominal pain, diarrhea, hematemesis, nausea/vomiting or stool incontinence Genito-Urinary  ROS: negative for - dysuria, hematuria, incontinence or urinary frequency/urgency Musculoskeletal ROS: negative for - joint swelling  or muscular weakness Neurological ROS: as noted in HPI Dermatological ROS: negative for rash and skin lesion changes   Blood pressure 159/70, pulse 79, temperature 97.9 F (36.6 C), temperature source Oral, resp. rate 16, SpO2 95.00%.   Neurologic Examination:                                                                                                      Mental Status: Alert, oriented, thought content appropriate.  Speech fluent without evidence of aphasia.  Able to follow 3 step commands without difficulty. Cranial Nerves: II: Discs flat bilaterally; Visual fields grossly normal, pupils equal, round, reactive to light and accommodation III,IV, VI: ptosis not present, extra-ocular motions intact bilaterally V,VII: smile symmetric, facial light touch sensation normal bilaterally VIII: hearing normal bilaterally IX,X: gag reflex present XI: bilateral shoulder shrug XII: midline tongue extension without atrophy or fasciculations  Motor: Right : Upper extremity   5/5    Left:     Upper extremity   4/5 with poor effort due to shoulder pain  Lower extremity   5/5     Lower extremity   5/5 No myoclonus or asterixis noted Sensory: Pinprick and light touch intact throughout, bilaterally Deep Tendon Reflexes:  Right: Upper Extremity   Left: Upper extremity   biceps (C-5 to C-6) 2/4   biceps (C-5 to C-6) 2/4 tricep (C7) 2/4    triceps (C7) 2/4 Brachioradialis (C6) 2/4  Brachioradialis (C6) 2/4  Lower Extremity Lower Extremity  quadriceps (L-2 to L-4) 0/4   quadriceps (L-2 to L-4) 0/4 Achilles (S1) 0/4   Achilles (S1) 0/4  Plantars: Right: downgoing   Left: downgoing Cerebellar: normal finger-to-nose,  normal heel-to-shin test Gait: short shuffled steps but able to walk unassisted.  CV: pulses palpable throughout    Lab Results: Basic Metabolic Panel:  Recent Labs Lab 12/27/13 1107 12/28/13 0515  NA 137 142  K 5.3 4.6  CL 99 105  CO2 24 27  GLUCOSE 84 93  BUN 24* 16   CREATININE 1.06 0.88  CALCIUM 9.3 8.8    Liver Function Tests:  Recent Labs Lab 12/27/13 1107  AST 32  ALT 10  ALKPHOS 74  BILITOT <0.2*  PROT 7.4  ALBUMIN 3.5   No results found for this basename: LIPASE, AMYLASE,  in the last 168 hours No results found for this basename: AMMONIA,  in the last 168 hours  CBC:  Recent Labs Lab 12/27/13 1107 12/28/13 0515  WBC 7.1 5.6  NEUTROABS 4.0  --   HGB 8.9* 8.3*  HCT 31.1* 28.9*  MCV 77.0* 77.3*  PLT 236 200    Cardiac Enzymes:  Recent Labs Lab 12/27/13 1107 12/27/13 1836  CKTOTAL  --  47  TROPONINI <0.30  --     Lipid Panel: No results found for this basename: CHOL, TRIG, HDL, CHOLHDL, VLDL, LDLCALC,  in the last 168 hours  CBG: No results found for this basename: GLUCAP,  in the last 168  hours  Microbiology: Results for orders placed during the hospital encounter of 09/14/13  URINE CULTURE     Status: None   Collection Time    09/16/13  6:48 AM      Result Value Ref Range Status   Specimen Description URINE, RANDOM   Final   Special Requests NONE   Final   Culture  Setup Time     Final   Value: 09/16/2013 14:55     Performed at [facility]     Final   Value: 50,000 COLONIES/ML     Performed at [facility]   Culture     Final   Value: PSEUDOMONAS AERUGINOSA     Performed at [facility]   Report Status 09/18/2013 FINAL   Final   Organism ID, Bacteria PSEUDOMONAS AERUGINOSA   Final    Coagulation Studies: No results found for this basename: LABPROT, INR,  in the last 72 hours  Imaging: Dg Chest 2 View  12/27/2013   CLINICAL DATA:  Twitching of arms and legs with history of hypertension.  EXAM: CHEST  2 VIEW  COMPARISON:  PA and lateral chest of September 08, 2013.  FINDINGS: The lungs are adequately inflated and clear. The cardiac silhouette is top-normal in size. The pulmonary vascularity is not engorged. There is no pleural effusion. There is mild compression  of the body of approximately T12 which appears new. The loss of height is approximately 30% anteriorly.  IMPRESSION: There is no acute cardiopulmonary abnormality. Partial L3 wedge compression has occurred since the previous study.   Electronically Signed   By: David  Martinique   On: 12/27/2013 12:51   Ct Head Wo Contrast  12/27/2013   CLINICAL DATA:  80 year old female with weakness, fatigue, headache, pain, any knee. Initial encounter.  EXAM: CT HEAD WITHOUT CONTRAST  TECHNIQUE: Contiguous axial images were obtained from the base of the skull through the vertex without intravenous contrast.  COMPARISON:  Head CT without contrast 07/14/2007.  FINDINGS: Mastoids remain clear. New fluid level in the right sphenoid sinus. Other visible paranasal sinuses are clear. No acute osseous abnormality identified.  No acute orbit or scalp soft tissue findings.  Calcified atherosclerosis at the skull base. Cerebral volume loss since 2008, appears generalized. No suspicious intracranial vascular hyperdensity. Small hypodense focus in the central right thalamus is chronic (series 2, image 14 today and series 2, image 16 previously). Elsewhere normal for age gray-white matter differentiation. No midline shift, ventriculomegaly, mass effect, evidence of mass lesion, intracranial hemorrhage or evidence of cortically based acute infarction.  IMPRESSION: 1. No acute intracranial abnormality. Stable small chronic lacunar infarct in the thalamus. 2. New right sphenoid sinus fluid level, Nigg represent mild sinusitis. Other visible paranasal sinuses and mastoids are clear.   Electronically Signed   By: Lars Pinks M.D.   On: 12/27/2013 15:05       Assessment and plan per attending neurologist  Etta Quill PA-C [facility] [phone]  12/28/2013, 12:42 PM  I have seen and evaluated the patient. I have reviewed the above note and made appropriate changes.    Assessment/Plan: 80 YO female with generalized weakness  for over 6 weeks.  She attributes her weakness to the start of Cymbalta.  Since that date she has a sensation of weakness to point she has been using a wheel chair at home and daughter stays with her. On exam she has full 5/5 strength and shows no asterixis or myoclonus.  Likely asthenia in setting of generalized deconditioning.  Polypharmacy could certainly also be playing a role.   If after she continues to have PT, she has continued decline, then futher evaluation, possibly with EMG to look for myopathic units, Spake be reasonable and consideration of discontinuing simvastatin.    Recommend: 1) D/C baclofen 2) Ativan as QHS only 3) Continue neurontin at 600 mg qhs 4) MRI brain  5) EEG to evaluate jerking spells.  6) If weakness progresses despite physical therapy, consider d/c simvastatin.   Roland Rack, MD [facility] [phone]  If 7pm- 7am, please page neurology on call as listed in [place].

## 2013-12-29 ENCOUNTER — Observation Stay (HOSPITAL_COMMUNITY)
Admit: 2013-12-29 | Discharge: 2013-12-29 | Disposition: A | Discharge status: Home / Self Care | Payer: Medicare HMO | Attending: Neurology | Admitting: Neurology

## 2013-12-29 DIAGNOSIS — R279 Unspecified lack of coordination: Secondary | ICD-10-CM

## 2013-12-29 LAB — RETICULOCYTES
RBC.: 3.83 MIL/uL — ABNORMAL LOW (ref 3.87–5.11)
Retic Count, Absolute: 72.8 10*3/uL (ref 19.0–186.0)
Retic Ct Pct: 1.9 % (ref 0.4–3.1)

## 2013-12-29 LAB — IRON AND TIBC
Iron: 18 ug/dL — ABNORMAL LOW (ref 42–135)
Saturation Ratios: 6 % — ABNORMAL LOW (ref 20–55)
TIBC: 310 ug/dL (ref 250–470)
UIBC: 292 ug/dL (ref 125–400)

## 2013-12-29 LAB — VITAMIN B12: Vitamin B-12: 692 pg/mL (ref 211–911)

## 2013-12-29 LAB — FOLATE: Folate: >20 ng/mL

## 2013-12-29 LAB — FERRITIN: Ferritin: 20 ng/mL (ref 10–291)

## 2013-12-29 NOTE — Evaluation (Signed)
Physical Therapy Evaluation Patient Details Name: [name] MRN: [id number] DOB: [birth year] Today's Date: 12/29/2013   History of Present Illness  83 yr with recent episodes of jerking/shaking of any extremity and weakness that has been limiting her mobility and safety. She did have a recent fall in past 2 weeks due to jurking while up on her feet. She had recent surgey in March on her knee (prior to this pt was home alone and indepenedent), since this time pt 's dtr has been staying with her and assisting . Pt has also been using WC due to knee pain and fear with tremors as well. She does walk in house occassionally with 4 wheeled RW as welll, but not much in lpast few weeks. MRI - this visit.   Clinical Impression  Pt presented with some resting tremors while conducting verbal history at bedside. Tremors in UEs did stop once I handed her a pen , however started back up after few seconds. Difficult to assess tremors only at rest or UES, bc observed BUES and at bedside saw a little more whole body and/or LEs involvement as well. Pt present with less tremors with movement during our session today, and some weakness as well. Feel pt to benefit from PT wile here , as well as continue with either HHPT , and highly recommend f/u at [facility] Neuro Therapy.     Follow Up Recommendations Home health PT (recommended to quickly transition to OP neuro therapy as well. )    Equipment Recommendations  None recommended by PT    Recommendations for Other Services       Precautions / Restrictions Precautions Precautions: Fall Restrictions Weight Bearing Restrictions: No      Mobility  Bed Mobility Overal bed mobility: Needs Assistance Bed Mobility: Sit to Supine       Sit to supine: Min guard      Transfers Overall transfer level: Needs assistance Equipment used: Rolling walker (2 wheeled) Transfers: Sit to/from Stand Sit to Stand: Min guard         General transfer comment: exhibited good  safety placment with RW and hands, min to steady due to tremors and unknown  Ambulation/Gait Ambulation/Gait assistance: Min assist;+2 safety/equipment Ambulation Distance (Feet): 20 Feet Assistive device: Rolling walker (2 wheeled) Gait Pattern/deviations: Step-through pattern     General Gait Details: small steps, diffulty due to no brace on L and no shoe lift on L LE at this time. Dtr stated this si the most walking she has done in past few weeks.   Stairs            Wheelchair Mobility    Modified Rankin (Stroke Patients Only)       Balance                                             Pertinent Vitals/Pain "all over " this is normal for me . More specifically generalized pain in B knees however tolerated general movement during the eval.     Home Living Family/patient expects to be discharged to:: Private residence Living Arrangements: Children (dtr staying with pt and has another family member with her when she goes to work. ) Available Help at Discharge: Family;Available 24 hours/day Type of Home: House Home Access: Ramped entrance     Home Layout: One level Home Equipment: Walker - 2 wheels;Bedside commode;Grab  bars - toilet;Grab bars - tub/shower Additional Comments: pt reports she has family that can stay with her 24/7 when she gets home if she needs it. She has had multiple knee and back surgeries    Prior Function Level of Independence: Independent with assistive device(s)         Comments: using RW some, but mostly WC for past few weks due to knee paina dn fear of falling with tremors. Would like to gt back to walking more.      Hand Dominance        Extremity/Trunk Assessment               Lower Extremity Assessment: Generalized weakness (toe tapping, finger to nose, finger opposition, all WNL, strength grossly 4-/5 for LES)         Communication   Communication: No difficulties  Cognition                             General Comments      Exercises Other Exercises Other Exercises: educated pt and dtr on how important to keep weight bearing activities in daily routine. If fear of falling, then performing standing at sink with WC behind pt to encourage upright posture  and strengthening. Pt and dtr will begin doing this at home.       Assessment/Plan    PT Assessment Patient needs continued PT services  PT Diagnosis Difficulty walking;Generalized weakness   PT Problem List Decreased strength;Decreased activity tolerance;Decreased balance;Decreased mobility  PT Treatment Interventions Gait training;Functional mobility training;Therapeutic activities;Therapeutic exercise;Neuromuscular re-education;Patient/family education   PT Goals (Current goals can be found in the Care Plan section) Acute Rehab PT Goals Patient Stated Goal: to get better and get rid of these tremors and headache.  PT Goal Formulation: With patient Time For Goal Achievement: 01/12/14 Potential to Achieve Goals: Good    Frequency Min 3X/week   Barriers to discharge        Co-evaluation PT/OT/SLP Co-Evaluation/Treatment: Yes (just a portion during ambulation for safety) Reason for Co-Treatment: For patient/therapist safety PT goals addressed during session: Mobility/safety with mobility OT goals addressed during session: ADL's and self-care       End of Session Equipment Utilized During Treatment: Gait belt Activity Tolerance: Patient tolerated treatment well Patient left: in chair;with family/visitor present Nurse Communication: Mobility status    Functional Assessment Tool Used: clinical judgement Functional Limitation: Mobility: Walking and moving around Mobility: Walking and Moving Around Current Status [phone]): At least 20 percent but less than 40 percent impaired, limited or restricted Mobility: Walking and Moving Around Goal Status [phone]): At least 1 percent but less than 20 percent impaired,  limited or restricted    Time: 1100-1140 PT Time Calculation (min): 40 min   Charges:   PT Evaluation $Initial PT Evaluation Tier I: 1 Procedure PT Treatments $Gait Training: 8-22 mins $Therapeutic Activity: 8-22 mins   PT G Codes:   Functional Assessment Tool Used: clinical judgement Functional Limitation: Mobility: Walking and moving around    [place], [facility] 12/29/2013, 12:04 PM Clide Dales, PT Pager: [phone] 12/29/2013

## 2013-12-29 NOTE — Progress Notes (Signed)
EEG completed; results pending.    

## 2013-12-29 NOTE — Progress Notes (Signed)
[facility] PROGRESS NOTE  [name] EHM:[id number] DOB: 18-Sep-1929 DOA: 12/27/2013 PCP: Gerrit Heck, MD  Assessment/Plan: Tremors/Weakness -Currently undergoing work up recommended by neurology. -MRI Brain negative. -EEG pending. -?polypharmacy. Meds adjusted by neuro. -Doubt we will find an answer to her problems. -Will need to continue OP neuro follow up. -PT/OT recs HH services. -B12 in 3/15 >1000.  HTN -fair control. -Continue current regimen  Chronic Diastolic CHF -Well compensated.  Anemia/Iron Deficiency -FOBT +. -Had colonoscopy 8 yrs ago reported as normal. Saw GI Henrene Pastor) a few months who recommended no further screening given her age. -Continue iron supplementation, but increase to BID from QD. -Hb steady around 8.3-8.9. -No need for transfusion at present.  Code Status: Full Code Family Communication: Discussed extensively with daughter at bedside/  Disposition Plan: Home when ready; likely 24-48 hours.   Consultants:  Neurology   Antibiotics:  None   Subjective: C/o weakness and jerking movements of upper extremities.  Objective: Filed Vitals:   12/28/13 1028 12/28/13 1407 12/28/13 2134 12/29/13 0613  BP: 159/70 121/64 168/77 135/80  Pulse: 79 84 79 88  Temp: 97.9 F (36.6 C) 97.7 F (36.5 C) 98.2 F (36.8 C) 98.1 F (36.7 C)  TempSrc: Oral Oral Oral Oral  Resp: 16 18 17 18   SpO2: 95% 96% 94% 96%    Intake/Output Summary (Last 24 hours) at 12/29/13 1308 Last data filed at 12/29/13 3662  Gross per 24 hour  Intake    240 ml  Output    850 ml  Net   -610 ml   There were no vitals filed for this visit.  Exam:   General:  AA Ox3  Cardiovascular: RRR  Respiratory: CTA B  Abdomen: S/NT/ND/+BS  Extremities: no C/C/E   Neurologic:  Non-focal  Data Reviewed: Basic Metabolic Panel:  Recent Labs Lab 12/27/13 1107 12/28/13 0515  NA 137 142  K 5.3 4.6  CL 99 105  CO2 24 27  GLUCOSE 84 93  BUN  24* 16  CREATININE 1.06 0.88  CALCIUM 9.3 8.8   Liver Function Tests:  Recent Labs Lab 12/27/13 1107  AST 32  ALT 10  ALKPHOS 74  BILITOT <0.2*  PROT 7.4  ALBUMIN 3.5   No results found for this basename: LIPASE, AMYLASE,  in the last 168 hours No results found for this basename: AMMONIA,  in the last 168 hours CBC:  Recent Labs Lab 12/27/13 1107 12/28/13 0515  WBC 7.1 5.6  NEUTROABS 4.0  --   HGB 8.9* 8.3*  HCT 31.1* 28.9*  MCV 77.0* 77.3*  PLT 236 200   Cardiac Enzymes:  Recent Labs Lab 12/27/13 1107 12/27/13 1836  CKTOTAL  --  67  TROPONINI <0.30  --    BNP (last 3 results)  Recent Labs  09/15/13 1640 12/27/13 1107  PROBNP 1858.0* 1267.0*   CBG: No results found for this basename: GLUCAP,  in the last 168 hours  No results found for this or any previous visit (from the past 240 hour(s)).   Studies: Ct Head Wo Contrast  12/27/2013   CLINICAL DATA:  80 year old female with weakness, fatigue, headache, pain, any knee. Initial encounter.  EXAM: CT HEAD WITHOUT CONTRAST  TECHNIQUE: Contiguous axial images were obtained from the base of the skull through the vertex without intravenous contrast.  COMPARISON:  Head CT without contrast 07/14/2007.  FINDINGS: Mastoids remain clear. New fluid level in the right sphenoid sinus. Other visible paranasal sinuses are clear. No acute osseous  abnormality identified.  No acute orbit or scalp soft tissue findings.  Calcified atherosclerosis at the skull base. Cerebral volume loss since 2008, appears generalized. No suspicious intracranial vascular hyperdensity. Small hypodense focus in the central right thalamus is chronic (series 2, image 14 today and series 2, image 16 previously). Elsewhere normal for age gray-white matter differentiation. No midline shift, ventriculomegaly, mass effect, evidence of mass lesion, intracranial hemorrhage or evidence of cortically based acute infarction.  IMPRESSION: 1. No acute intracranial  abnormality. Stable small chronic lacunar infarct in the thalamus. 2. New right sphenoid sinus fluid level, Wy[place] represent mild sinusitis. Other visible paranasal sinuses and mastoids are clear.   Electronically Signed   By: Lars Pinks M.D.   On: 12/27/2013 15:05   Mr Brain Wo Contrast  12/28/2013   CLINICAL DATA:  Confusion  EXAM: MRI HEAD WITHOUT CONTRAST  TECHNIQUE: Multiplanar, multiecho pulse sequences of the brain and surrounding structures were obtained without intravenous contrast.  COMPARISON:  CT head 12/27/2013  FINDINGS: Mild atrophy. This is most prominent in the frontal lobes with increased subarachnoid space over the frontal lobes bilaterally felt to be due atrophy in the frontal lobes. Ventricle size is normal.  Negative for acute infarct. No significant chronic ischemia. Brainstem and cerebellum are normal.  Negative for hemorrhage or mass. No edema or shift of the midline structures.  Mucosal edema in the sphenoid sinus on the right. Empty sella with enlargement of the sella and a small pituitary. Optic chiasm is normal.  IMPRESSION: Atrophy.  No acute abnormality.   Electronically Signed   By: Franchot Gallo M.D.   On: 12/28/2013 15:58    Scheduled Meds: . benazepril  20 mg Oral Daily  . enoxaparin (LOVENOX) injection  40 mg Subcutaneous Q24H  . feeding supplement (ENSURE COMPLETE)  237 mL Oral BID BM  . ferrous sulfate  325 mg Oral Q breakfast  . fluticasone  1 spray Each Nare Daily  . gabapentin  600 mg Oral QHS  . LORazepam  0.5 mg Oral QHS  . metoprolol  50 mg Oral BID  . sertraline  50 mg Oral Daily  . simvastatin  40 mg Oral QHS  . spironolactone  25 mg Oral Daily  . traZODone  50 mg Oral QHS   Continuous Infusions:   Active Problems:   ANEMIA   HYPERTENSION   DIASTOLIC HEART FAILURE, CHRONIC   GERD   Dehydration   Weakness    Time spent: 35 minutes. Greater than 50% of this time was spent in direct contact with the patient coordinating care.    Lelon Frohlich  [facility] Pager [phone]  If 7PM-7AM, please contact night-coverage at [email or web address], password [facility] 12/29/2013, 1:08 PM  LOS: 2 days

## 2013-12-29 NOTE — Evaluation (Signed)
Occupational Therapy Evaluation Patient Details Name: [name] MRN: [id number] DOB: 09/14/1929 Today's Date: 12/29/2013    History of Present Illness 83 yr with recent episodes of jerking/shaking of any extremity and weakness that has been limiting her mobility and safety. She did have a recent fall in past 2 weeks due to jurking while up on her feet. She had recent surgey in March on her knee (prior to this pt was home alone and indepenedent), since this time pt 's dtr has been staying with her and assisting . Pt has also been using WC due to knee pain and fear with tremors as well. She does walk in house occassionally with 4 wheeled RW as welll, but not much in lpast few weeks. MRI - this visit.    Clinical Impression   Pt admitted with the above diagnosis and has the deficits listed below. Pt would benefit from cont OT to increase I with basic adls and safety with basic adls so she can hopefully be safer when standing during all adls.  Pt would benefit from [facility] to start but transition to OPOT to address further rehab for UE tremors.    Follow Up Recommendations  Home health OT;Supervision/Assistance - 24 hour    Equipment Recommendations  None recommended by OT    Recommendations for Other Services       Precautions / Restrictions Precautions Precautions: Fall Required Braces or Orthoses: Other Brace/Splint (pt states at home if walking she wears a lift shoe on L LE and a knee brace cusotmized by [employer] for L knee stability. ) Restrictions Weight Bearing Restrictions: No      Mobility Bed Mobility Overal bed mobility: Needs Assistance Bed Mobility: Sit to Supine       Sit to supine: Min guard      Transfers Overall transfer level: Needs assistance Equipment used: Rolling walker (2 wheeled) Transfers: Sit to/from Stand Sit to Stand: Min guard         General transfer comment: exhibited good safety placment with RW and hands, min to steady due to tremors and  unknown    Balance Overall balance assessment: Needs assistance Sitting-balance support: Feet supported Sitting balance-Leahy Scale: Good     Standing balance support: Bilateral upper extremity supported;During functional activity Standing balance-Leahy Scale: Poor Standing balance comment: feel pt had to have walker or outside support to remain standing safely.                            ADL Overall ADL's : Needs assistance/impaired Eating/Feeding: Sitting;Set up Eating/Feeding Details (indicate cue type and reason): pt does not need physical assist to feed self but does often time drop utensils due to jerking movements in BUES. Grooming: Set up;Sitting Grooming Details (indicate cue type and reason): pt also can do most grooming tasks w/o assist but does often drop items due to tremors. Upper Body Bathing: Set up;Sitting   Lower Body Bathing: Minimal assistance;Sit to/from stand Lower Body Bathing Details (indicate cue type and reason): S provided at home for safety.  Pt not steady in standing [place] therefore needs assist when standing to bathe LE. Upper Body Dressing : Set up;Sitting   Lower Body Dressing: Minimal assistance;Sit to/from stand Lower Body Dressing Details (indicate cue type and reason): min assist only needed in standing. Toilet Transfer: Minimal assistance;Stand-pivot;BSC (to chair) Toilet Transfer Details (indicate cue type and reason): Pt reached back for chair w/o cues. Pt anxious up on  feet with walker. Toileting- Clothing Manipulation and Hygiene: Minimal assistance;Sit to/from stand Toileting - Clothing Manipulation Details (indicate cue type and reason): min assist to pull pants up and down.  Pt safer when holding to walker and has a harder time letting go of walker to do adls in standing.     Functional mobility during ADLs: Moderate assistance;Rolling walker General ADL Comments: Pt does well with adls when in sitting despite often dropping items  due to tremors.  More assist is necessary when pt is on her feet due to instability in standing and fatigue in standing since pt has not done much walking in last few weeks.     Vision                     Perception     Praxis      Pertinent Vitals/Pain Pt did not c/o pain.     Hand Dominance Right   Extremity/Trunk Assessment Upper Extremity Assessment Upper Extremity Assessment: RUE deficits/detail;LUE deficits/detail RUE Deficits / Details: pt with tremor at times throughout RUE and LUE.  It is sometimes at rest but other times not.  Very inconsistent but does cause pt to often time drop items like fork/grooming utensils. RUE Coordination: decreased fine motor LUE Deficits / Details: see RUE LUE Coordination: decreased fine motor   Lower Extremity Assessment Lower Extremity Assessment: Generalized weakness (toe tapping, finger to nose, finger opposition, all WNL, strength grossly 4-/5 for LES)       Communication Communication Communication: No difficulties   Cognition Arousal/Alertness: Awake/alert Behavior During Therapy: WFL for tasks assessed/performed Overall Cognitive Status: Within Functional Limits for tasks assessed                     General Comments       Exercises   Other Exercises Other Exercises: educated pt and dtr on how important to keep weight bearing activities in daily routine. If fear of falling, then performing standing at sink with WC behind pt to encourage upright posture  and strengthening. Pt and dtr will begin doing this at home.    Shoulder Instructions      Home Living Family/patient expects to be discharged to:: Private residence Living Arrangements: Children (dtr staying with pt and has another family member with her when she goes to work. ) Available Help at Discharge: Family;Available 24 hours/day Type of Home: House Home Access: Ramped entrance     Home Layout: One level     Bathroom Shower/Tub: Emergency planning/management officer: Handicapped height Bathroom Accessibility: Yes How Accessible: Accessible via wheelchair Home Equipment: [place] - 2 wheels;Bedside commode;Grab bars - toilet;Grab bars - tub/shower   Additional Comments: pt reports she has family that can stay with her 24/7 when she gets home if she needs it. She has had multiple knee and back surgeries      Prior Functioning/Environment Level of Independence: Independent with assistive device(s)    ADL's / Homemaking Assistance Needed: Pt requires S to get into shower and bathe.  Family has been doing most home making skills for last few months.   Comments: using RW some, but mostly WC for past few weks due to knee paina dn fear of falling with tremors. Would like to gt back to walking more.     OT Diagnosis: Generalized weakness   OT Problem List: Decreased activity tolerance;Impaired balance (sitting and/or standing);Decreased coordination   OT Treatment/Interventions: Self-care/ADL training;Therapeutic activities  OT Goals(Current goals can be found in the care plan section) Acute Rehab OT Goals Patient Stated Goal: to get better and get rid of these tremors and headache.  OT Goal Formulation: With patient/family Time For Goal Achievement: 01/12/14 Potential to Achieve Goals: Good ADL Goals Pt Will Perform Grooming: with supervision;standing Pt Will Perform Lower Body Bathing: with supervision;sit to/from stand Pt Will Perform Lower Body Dressing: with supervision;sit to/from stand Pt Will Perform Tub/Shower Transfer: Shower transfer;shower seat;rolling walker;ambulating;with supervision Additional ADL Goal #1: Pt will complete all toileting from walker level to comfort commode with S.  OT Frequency: Min 2X/week   Barriers to D/C:            Co-evaluation PT/OT/SLP Co-Evaluation/Treatment: Yes Reason for Co-Treatment: For patient/therapist safety PT goals addressed during session: Mobility/safety with  mobility OT goals addressed during session: ADL's and self-care      End of Session Equipment Utilized During Treatment: Rolling walker Nurse Communication: Mobility status  Activity Tolerance: Patient tolerated treatment well Patient left: in chair;with call bell/phone within reach;with family/visitor present   Time: 7915-0569 OT Time Calculation (min): 27 min Charges:  OT General Charges $OT Visit: 1 Procedure OT Evaluation $Initial OT Evaluation Tier I: 1 Procedure OT Treatments $Self Care/Home Management : 8-22 mins G-Codes: OT G-codes **NOT FOR INPATIENT CLASS** Functional Assessment Tool Used: clinical judgement Functional Limitation: Self care Self Care Current Status (V9480): At least 20 percent but less than 40 percent impaired, limited or restricted Self Care Goal Status (X6553): At least 1 percent but less than 20 percent impaired, limited or restricted  Glenford Peers 12/29/2013, 12:11 PM [phone]

## 2013-12-29 NOTE — Procedures (Signed)
ELECTROENCEPHALOGRAM REPORT   Patient: [name]       Room #: WU9811 EEG No. ID: [id number] Age: 80 y.o.        Sex: female Referring Physician: Jerilee Hoh Report Date:  12/29/2013        Interpreting Physician: Alexis Goodell D  History: [name] is an 80 y.o. female with abnormal involuntary movements evaluated to rule out seizure  Medications:  Scheduled: . benazepril  20 mg Oral Daily  . enoxaparin (LOVENOX) injection  40 mg Subcutaneous Q24H  . feeding supplement (ENSURE COMPLETE)  237 mL Oral BID BM  . ferrous sulfate  325 mg Oral Q breakfast  . fluticasone  1 spray Each Nare Daily  . gabapentin  600 mg Oral QHS  . LORazepam  0.5 mg Oral QHS  . metoprolol  50 mg Oral BID  . sertraline  50 mg Oral Daily  . simvastatin  40 mg Oral QHS  . spironolactone  25 mg Oral Daily  . traZODone  50 mg Oral QHS    Conditions of Recording:  This is a 16 channel EEG carried out with the patient in the awake state.  Description:  Muscle and movement artifact dominate the tracing.  There are infrequent occasions when the background activity can be evaluated.  During these times the waking background activity consists of a low voltage, symmetrical, fairly well organized, 10 Hz alpha activity, seen from the parieto-occipital and posterior temporal regions.  Low voltage fast activity, poorly organized, is seen anteriorly and is at times superimposed on more posterior regions.  A mixture of theta and alpha rhythms are seen from the central and temporal regions.  The patient does not drowse or sleep.  Hyperventilation and intermittent photic stimulation were not performed.  IMPRESSION: This is a normal awake electroencephalogram.  No epileptiform activity is noted.    Comment:  An EEG with the patient sleep deprived to elicit drowse and light sleep [place] be desirable to further elicit a possible seizure disorder.     Alexis Goodell, MD [facility] [phone] 12/29/2013,  9:29 PM

## 2013-12-29 NOTE — Progress Notes (Signed)
Clinical Social Work  CSW received a call from [employer] CM that family had questions about placement. Patient agreeable to family involvement so CSW called and left a message with dtr with CSW contact information. CSW will continue to follow.  [place], [place] [phone]

## 2013-12-29 NOTE — Care Management Note (Addendum)
    Page 1 of 2   12/30/2013     2:07:09 PM CARE MANAGEMENT NOTE 12/30/2013  Patient:  [name], [name]   Account Number:  [id number]  Date Initiated:  12/29/2013  Documentation initiated by:  [facility]  Subjective/Objective Assessment:   80 year old female admitted with generalized weakness since 6 weeks.     Action/Plan:   From home.   Anticipated DC Date:  12/30/2013   Anticipated DC Plan:  [place]  In-house referral  Clinical Social Worker      DC Planning Services  CM consult      Choice offered to / List presented to:  C-4 Adult Children        HH arranged  HH-2 PT  HH-3 OT  HH-1 RN  [place].   Status of service:  Completed, signed off Medicare Important Message given?   (If response is "NO", the following Medicare IM given date fields will be blank) Date Medicare IM given:   Date Additional Medicare IM given:    Discharge Disposition:    Per UR Regulation:  Reviewed for med. necessity/level of care/duration of stay  If discussed at [place] of Stay Meetings, dates discussed:    Comments:  12/30/13 Allene Dillon RN BSN [phone] Met with pt's daughter [name] and grandaughter Shirlean Mylar to discuss d/c planning. CSW also present. They would like pt place in SNF for rehab. CSW has already spoken with insurance company who stated they will not cover the stay as they feel she is not appropriate for SNF. PT/OT has recommended [facility] services. Daughter and granddaughter state they do not have anyone to be with pt during the hours they are at work and that pt is unsafe alone and had a fall at home a few weeks ago. I provided them with a list of private duty careggivers and informed them they have to make the arrangements themselves since it is private pay. CSW also discussed with them other options they Poch pursue for pt's care after d/c.   12/29/13 Allene Dillon RN BSN [phone] Met with pt  and daughter at bedside to offer [facility] agency choice. They chose [facility] to provide the services. Referral made. MD made aware that she needs to place orders for the services.

## 2013-12-29 NOTE — Progress Notes (Signed)
UR Completed Brenda Graves-Bigelow, RN,BSN [phone]  

## 2013-12-29 NOTE — Progress Notes (Signed)
Clinical Social Work Department BRIEF PSYCHOSOCIAL ASSESSMENT 12/29/2013  Patient:  [name], [name]     Account Number:  [id number]     Admit date:  12/27/2013  Clinical Social Worker:  Earlie Server  Date/Time:  12/29/2013 05:00 PM  Referred by:  Care Management  Date Referred:  12/29/2013 Referred for  SNF Placement   Other Referral:   Interview type:  Family Other interview type:    PSYCHOSOCIAL DATA Living Status:  ALONE Admitted from facility:   Level of care:   Primary support name:  [name] Primary support relationship to patient:  CHILD, ADULT Degree of support available:   Adequate    CURRENT CONCERNS Current Concerns  Post-Acute Placement   Other Concerns:    SOCIAL WORK ASSESSMENT / PLAN CSW received referral from CM reporting that patient's family is interested in SNF placement. CSW reviewed chart which stated that PT/OT is recommending HH. CSW spoke with Ria Comment from [place] who approves insurance authorizations for [employer] and was informed that SNF would not be approved because patient is not meeting criteria.    CSW spoke with dtr who reports that patient lives alone and is concerned about her returning home. Dtr reports she would feel more comfortable if patient would go to [place] SNF for about 1-2 weeks prior to returning home. CSW explained that insurance will not approve SNF stay but if family was interested then CSW could assist with placement knowing that patient would have to pay privately. Dtr reports that they do not have the funds and patient would have to return home. Dtr inquired why patient was approved after surgery but not approved this time for SNF. CSW explained that insurance company has reviewed records from this hospitalization and does not feel that patient meets acuity for SNF placement. Dtr upset with insurance decision but reports understanding.    Dtr reports she will work with CM in order to ensure [facility] is arranged. CSW is signing off but  available if any further needs arise.   Assessment/plan status:  No Further Intervention Required Other assessment/ plan:   Information/referral to community resources:   SNF information    PATIENT'S/FAMILY'S RESPONSE TO PLAN OF CARE: Patient agreeable to family involvement. Dtr engaged and asked appropriate questions re: DC planning. Dtr is concerned about patient's wellbeing but understanding of insurance decision and does not want to pursue SNF placement if patient would have to pay privately. Dtr thanked CSW for insurance and reports no further needs. Dtr reports she and family will assist patient more at DC in order to ensure patient's safety.       [place], [place] [phone]  Late Entry for 5pm

## 2013-12-30 LAB — VITAMIN D 1,25 DIHYDROXY
Vitamin D 1, 25 (OH)2 Total: 32 pg/mL (ref 18–72)
Vitamin D2 1, 25 (OH)2: <8 pg/mL
Vitamin D3 1, 25 (OH)2: 32 pg/mL

## 2013-12-30 MED ORDER — LORAZEPAM 0.5 MG PO TABS: 0.5000 mg | ORAL_TABLET | Freq: Every day | ORAL | Status: DC

## 2013-12-30 MED ORDER — FERROUS SULFATE 325 (65 FE) MG PO TABS: 325.0000 mg | ORAL_TABLET | Freq: Two times a day (BID) | ORAL | Status: DC

## 2013-12-30 MED ORDER — DIPHENHYDRAMINE HCL 12.5 MG/5ML PO ELIX
12.5000 mg | ORAL_SOLUTION | Freq: Every evening | ORAL | Status: DC | PRN
  Administered 2013-12-30: 12.5 mg via ORAL
  Filled 2013-12-30 (×3): qty 5

## 2013-12-30 NOTE — Progress Notes (Signed)
Pt discharged home with her daughter. Pt and her daughter verbalize understanding d/c instructions, follow-up appts, and discharge medications. Pt denied having any complaints at the time of discharge.

## 2013-12-30 NOTE — Progress Notes (Signed)
OT Cancellation Note  Patient Details Name: [name] MRN: [id number] DOB: [birth year]   Cancelled Treatment:    Reason Eval/Treat Not Completed: Patient declined, no reason specified. Pt politely declined while eating her lunch, stating that she will d/c home today and that she and family are just waiting on the doctor  Britt Bottom 12/30/2013, 11:56 AM

## 2013-12-30 NOTE — Progress Notes (Signed)
Spoke with patient's daughter on the phone and explained results of EEG and MRI brain. I conveyed to her my impression that no further inpatient neurological work up is needed but suggested the possibility that [name] could be suffering from an episodic hyperkinetic movement disorder perhaps medication induced and thus suggested outpatient neurology follow up with one of the movement disorder specialists in town. She agreed to pursue that pathway.  Dorian Pod, MD [facility].

## 2013-12-30 NOTE — Progress Notes (Signed)
Clinical Social Work  MD informed CSW she would call and appeal [employer]'s denial for SNF. CSW spoke with patient and family about SNF process and they are agreeable for [facility] search. [facility]'s preference [facility] so CSW called and left a message with admissions. CSW will continue to follow and will assist with transfer to SNF if insurance approves.  [place], [place] [phone]

## 2013-12-30 NOTE — Progress Notes (Signed)
UR Completed Brenda Graves-Bigelow, RN,BSN [phone]  

## 2013-12-30 NOTE — Progress Notes (Signed)
Clinical Social Work  Plan was discussed with medical staff who reports patient will return home with dtr and [facility] services. CSW informed RN of plans. CSW is signing off but available if further needs arise.  [place], [place] [phone]

## 2013-12-30 NOTE — Progress Notes (Addendum)
Clinical Social Work Department CLINICAL SOCIAL WORK PLACEMENT NOTE 12/30/2013  Patient:  [name], [name]  Account Number:  [id number] Admit date:  12/27/2013  Clinical Social Worker:  Sindy Messing, LCSW  Date/time:  12/30/2013 12:00 N  Clinical Social Work is seeking post-discharge placement for this patient at the following level of care:   [place]   (*CSW will update this form in Epic as items are completed)   12/30/2013  Patient/family provided with [place] Department of Clinical Social Work's list of facilities offering this level of care within the geographic area requested by the patient (or if unable, by the patient's family).  12/30/2013  Patient/family informed of their freedom to choose among providers that offer the needed level of care, that participate in Medicare, Medicaid or managed care program needed by the patient, have an available bed and are willing to accept the patient.  12/30/2013  Patient/family informed of [facility]' ownership interest in [facility], as well as of the fact that they are under no obligation to receive care at this facility.  PASARR submitted to EDS on existing # PASARR number received on   FL2 transmitted to all facilities in geographic area requested by pt/family on  12/30/2013 FL2 transmitted to all facilities within larger geographic area on   Patient informed that his/her managed care company has contracts with or will negotiate with  certain facilities, including the following:     Patient/family informed of bed offers received:   Patient chooses bed at  Physician recommends and patient chooses bed at    Patient to be transferred to  on   Patient to be transferred to facility by  Patient and family notified of transfer on  Name of family member notified:    The following physician request were entered in Epic:   Additional Comments:   12/30/13- Pt to return home with dtr and [facility] services

## 2013-12-30 NOTE — Discharge Summary (Signed)
Physician Discharge Summary  [name] KPT:[id number] DOB: [birth year] DOA: 12/27/2013  PCP: Gerrit Heck, MD  Admit date: 12/27/2013 Discharge date: 12/30/2013  Time spent: 45 minutes  Recommendations for Outpatient Follow-up:  -Will be discharged home today. -Advised to follow up with PCP in 2 weeks. -Also advised to follow up with [facility]. -[place] services have been arranged prior to DC.   Discharge Diagnoses:  Active Problems:   ANEMIA   HYPERTENSION   DIASTOLIC HEART FAILURE, CHRONIC   GERD   Dehydration   Weakness   Discharge Condition: Stable and improved  There were no vitals filed for this visit.  History of present illness:  [name] is a 80 y.o. female with multiple medical problems including chronic diastolic heart failure, depression, GERD, hypertension, was brought in by her daughter for worsening weakness over the last 6 weeks, associated with decreased appetite. She denies nausea, vomiting , fever or chills. No weight loss. No chest pain, sob, cough , palpitations, . She was brought to ED, and her labs showed elevated BUN, And hemoglobin of 8.9. She was referred to medical service for further evaluation and management. As per the daughter at bedside, pt had an episode of disorientation/ confusion on Friday evening. Pt is a poor historian and most of the history was avaialble from the daughter at bedside.    Hospital Course:   Tremors/Weakness  -Has been seen in consultation by neurology who recommended MRI, EEG and reduction in doses of multiple meds including baclofen, cymbalta and ativan. -MRI Brain negative.  -EEG without seizure activity.  -?polypharmacy. Meds adjusted by neuro.  -Neurology has no further insight into the etiology of her symptoms and have recommended that she been seen by a movement disorder specialist in the OP setting. -Has been seen by PT/OT who are recommending [facility] services; however, after watching her  ambulate and after discussions with family, I believe she would benefit from ST-SNF for rehab purposes. We have run into issues with her insurance company and unfortunately this was not possible. She will be discharged home today, CM will maximize [facility] services and they have been provided with a private duty sitter list.  HTN  -fair control.  -Continue current regimen   Chronic Diastolic CHF  -Well compensated.   Anemia/Iron Deficiency  -FOBT +.  -Had colonoscopy 8 yrs ago reported as normal. Saw GI Henrene Pastor) a few months who recommended no further screening given her age.  -Continue iron supplementation, but increase to BID from QD.  -Hb steady around 8.3-8.9.  -No need for transfusion at present.   Procedures:  As above   Consultations:  Neurology  Discharge Instructions  Discharge Instructions   Diet - low sodium heart healthy    Complete by:  As directed      Discontinue IV    Complete by:  As directed      Increase activity slowly    Complete by:  As directed             Medication List    STOP taking these medications       baclofen 10 MG tablet  Commonly known as:  LIORESAL     omeprazole 40 MG capsule  Commonly known as:  PRILOSEC      TAKE these medications       benazepril 40 MG tablet  Commonly known as:  LOTENSIN  Take 0.5 tablets (20 mg total) by mouth daily.     benzonatate 100 MG  capsule  Commonly known as:  TESSALON  Take 100 mg by mouth 3 (three) times daily as needed for cough.     cholecalciferol 1000 UNITS tablet  Commonly known as:  VITAMIN D  Take 1,000 Units by mouth daily.     ferrous sulfate 325 (65 FE) MG tablet  Take 1 tablet (325 mg total) by mouth 2 (two) times daily with a meal.     gabapentin 600 MG tablet  Commonly known as:  NEURONTIN  Take 600 mg by mouth daily.     HYDROcodone-acetaminophen 5-325 MG per tablet  Commonly known as:  NORCO  Take 1-2 tablets by mouth every 6 (six) hours as needed for moderate pain.       LORazepam 0.5 MG tablet  Commonly known as:  ATIVAN  Take 1 tablet (0.5 mg total) by mouth at bedtime.     metoprolol 50 MG tablet  Commonly known as:  LOPRESSOR  Take 1 tablet (50 mg total) by mouth 2 (two) times daily.     multivitamin with minerals tablet  Take 1 tablet by mouth daily.     nitroGLYCERIN 0.4 MG SL tablet  Commonly known as:  NITROSTAT  Place 0.4 mg under the tongue every 5 (five) minutes as needed for chest pain.     sertraline 50 MG tablet  Commonly known as:  ZOLOFT  Take 1 tablet (50 mg total) by mouth daily.     simvastatin 40 MG tablet  Commonly known as:  ZOCOR  Take 1 tablet (40 mg total) by mouth at bedtime.     spironolactone 25 MG tablet  Commonly known as:  ALDACTONE  Take 1 tablet (25 mg total) by mouth daily.     traMADol 50 MG tablet  Commonly known as:  ULTRAM  Take 50 mg by mouth every 8 (eight) hours.     traZODone 100 MG tablet  Commonly known as:  DESYREL  Take 50 mg by mouth at bedtime.     triamcinolone cream 0.1 %  Commonly known as:  KENALOG  Apply 1 application topically 2 (two) times daily as needed (Eczema on legs).     Vitamin C 500 MG Caps  Take 1 tablet by mouth daily.       No Known Allergies     Follow-up Information   Follow up with [facility]. (Ask for neurologist who specializes in movement disorders)    Contact information:   [address] [place] [address] [phone]       The results of significant diagnostics from this hospitalization (including imaging, microbiology, ancillary and laboratory) are listed below for reference.    Significant Diagnostic Studies: Dg Chest 2 View  12/27/2013   CLINICAL DATA:  Twitching of arms and legs with history of hypertension.  EXAM: CHEST  2 VIEW  COMPARISON:  PA and lateral chest of September 08, 2013.  FINDINGS: The lungs are adequately inflated and clear. The cardiac silhouette is top-normal in size. The pulmonary  vascularity is not engorged. There is no pleural effusion. There is mild compression of the body of approximately T12 which appears new. The loss of height is approximately 30% anteriorly.  IMPRESSION: There is no acute cardiopulmonary abnormality. Partial L3 wedge compression has occurred since the previous study.   Electronically Signed   By: David  Martinique   On: 12/27/2013 12:51   Ct Head Wo Contrast  12/27/2013   CLINICAL DATA:  80 year old female with weakness, fatigue, headache,  pain, any knee. Initial encounter.  EXAM: CT HEAD WITHOUT CONTRAST  TECHNIQUE: Contiguous axial images were obtained from the base of the skull through the vertex without intravenous contrast.  COMPARISON:  Head CT without contrast 07/14/2007.  FINDINGS: Mastoids remain clear. New fluid level in the right sphenoid sinus. Other visible paranasal sinuses are clear. No acute osseous abnormality identified.  No acute orbit or scalp soft tissue findings.  Calcified atherosclerosis at the skull base. Cerebral volume loss since 2008, appears generalized. No suspicious intracranial vascular hyperdensity. Small hypodense focus in the central right thalamus is chronic (series 2, image 14 today and series 2, image 16 previously). Elsewhere normal for age gray-white matter differentiation. No midline shift, ventriculomegaly, mass effect, evidence of mass lesion, intracranial hemorrhage or evidence of cortically based acute infarction.  IMPRESSION: 1. No acute intracranial abnormality. Stable small chronic lacunar infarct in the thalamus. 2. New right sphenoid sinus fluid level, Kepner represent mild sinusitis. Other visible paranasal sinuses and mastoids are clear.   Electronically Signed   By: Lars Pinks M.D.   On: 12/27/2013 15:05   Mr Brain Wo Contrast  12/28/2013   CLINICAL DATA:  Confusion  EXAM: MRI HEAD WITHOUT CONTRAST  TECHNIQUE: Multiplanar, multiecho pulse sequences of the brain and surrounding structures were obtained without  intravenous contrast.  COMPARISON:  CT head 12/27/2013  FINDINGS: Mild atrophy. This is most prominent in the frontal lobes with increased subarachnoid space over the frontal lobes bilaterally felt to be due atrophy in the frontal lobes. Ventricle size is normal.  Negative for acute infarct. No significant chronic ischemia. Brainstem and cerebellum are normal.  Negative for hemorrhage or mass. No edema or shift of the midline structures.  Mucosal edema in the sphenoid sinus on the right. Empty sella with enlargement of the sella and a small pituitary. Optic chiasm is normal.  IMPRESSION: Atrophy.  No acute abnormality.   Electronically Signed   By: Franchot Gallo M.D.   On: 12/28/2013 15:58    Microbiology: No results found for this or any previous visit (from the past 240 hour(s)).   Labs: Basic Metabolic Panel:  Recent Labs Lab 12/27/13 1107 12/28/13 0515  NA 137 142  K 5.3 4.6  CL 99 105  CO2 24 27  GLUCOSE 84 93  BUN 24* 16  CREATININE 1.06 0.88  CALCIUM 9.3 8.8   Liver Function Tests:  Recent Labs Lab 12/27/13 1107  AST 32  ALT 10  ALKPHOS 74  BILITOT <0.2*  PROT 7.4  ALBUMIN 3.5   No results found for this basename: LIPASE, AMYLASE,  in the last 168 hours No results found for this basename: AMMONIA,  in the last 168 hours CBC:  Recent Labs Lab 12/27/13 1107 12/28/13 0515  WBC 7.1 5.6  NEUTROABS 4.0  --   HGB 8.9* 8.3*  HCT 31.1* 28.9*  MCV 77.0* 77.3*  PLT 236 200   Cardiac Enzymes:  Recent Labs Lab 12/27/13 1107 12/27/13 1836  CKTOTAL  --  54  TROPONINI <0.30  --    BNP: BNP (last 3 results)  Recent Labs  09/15/13 1640 12/27/13 1107  PROBNP 1858.0* 1267.0*   CBG: No results found for this basename: GLUCAP,  in the last 168 hours     Signed:  Lelon Frohlich  [facility] Pager: [phone] 12/30/2013, 3:40 PM

## 2013-12-30 NOTE — Progress Notes (Signed)
Clinical Social Work  CSW met with patient's dtr and granddtr to discuss DC plans again. Dtr reports she has further questions and would like to discuss options with CM involved as well.  Dtr reports that she works 6 days a week and is unable to provide 24 hour care for patient. CSW explained if dtr was interested in SNF placement then CSW could assist, but just wanted to remind family that it would have to be paid for privately since insurance has denied approval. CSW spoke with [facility] representative Mendel Ryder) again re: approval. Patient does not have any skillable needs and PT/OT is recommending HH. Insurance remains agreeable to approve [facility] services. [employer] representative agreeable for CSW to provide her contact information if family has further questions. Dtr reports that this is not enough care because patient needs someone with her 24/7. Dtr reports that patient has not been walking since March and has been using a wheelchair at home. CSW inquired about plans prior to hospitalization due to patient's decline in mobility. Dtr reports she has been busy and has been unable to research options. Dtr and granddtr report they were informed by PCP if patient spent 3 days in the hospital then she could be placed at SNF and do not understand why the hospital will not assist with this transfer. CSW and CM explained that there are different criteria for insurance companies re: SNF placement. CSW once again reminded family that CSW is agreeable to assist with SNF placement, with the understanding that it would be private pay.  Granddtr reports she wants to appeal insurance decision so CSW encouraged family to [employer] and discuss concerns directly.   CSW spoke about LT plans. Dtr feels that patient is not improving and has had steady decline since March. CSW inquired if family had researched ALF placement for LT care. Dtr reports she has not had time to look but would possibly be interested for a LT  solution. CSW explained that ALF is private pay or patient could apply for Medicaid. CSW provided information for Department of Social Services and encouraged the family to contact DSS with further questions. Dtr reports that patient owns her home and wanted to know if patient would qualify for assistance. CSW explained that since hospital is not affiliated with DSS then CSW was unsure of criteria and encouraged family to contact DSS.  Dtr and granddtr report they will take patient home and will try to assist as much as they can to ensure patient's safety. CM spoke in further detail about [facility] services and provided private duty list. Wendall Mola and dtr report they will discuss DC date with MD. CSW and CM also staffed case with MD. CSW explained that insurance company had been contacted twice by CSW but patient is not meeting criteria for SNF placement.   CSW will continue to follow and will assist as needed. At this time, family is not interested in placement because they cannot afford placement financially. Family has CSW contact information if further needs arise.   [place], [place] [phone]

## 2013-12-31 ENCOUNTER — Other Ambulatory Visit: Payer: Self-pay | Admitting: Internal Medicine

## 2014-01-06 ENCOUNTER — Other Ambulatory Visit: Payer: Self-pay | Admitting: *Deleted

## 2014-01-12 ENCOUNTER — Ambulatory Visit: Payer: Medicare HMO

## 2014-01-12 ENCOUNTER — Encounter: Payer: Medicare HMO | Admitting: Cardiology

## 2014-01-12 ENCOUNTER — Ambulatory Visit: Payer: Medicare HMO | Admitting: Hematology and Oncology

## 2014-03-28 ENCOUNTER — Other Ambulatory Visit: Payer: Self-pay | Admitting: Internal Medicine

## 2014-04-20 ENCOUNTER — Ambulatory Visit (INDEPENDENT_AMBULATORY_CARE_PROVIDER_SITE_OTHER): Payer: Medicare HMO | Admitting: Internal Medicine

## 2014-04-20 ENCOUNTER — Encounter: Payer: Self-pay | Admitting: *Deleted

## 2014-04-20 ENCOUNTER — Telehealth: Payer: Self-pay

## 2014-04-20 ENCOUNTER — Encounter: Payer: Self-pay | Admitting: Internal Medicine

## 2014-04-20 VITALS — BP 150/78 | HR 69 | Temp 97.9°F | Resp 20 | Ht 63.0 in | Wt 176.0 lb

## 2014-04-20 DIAGNOSIS — K5730 Diverticulosis of large intestine without perforation or abscess without bleeding: Secondary | ICD-10-CM

## 2014-04-20 DIAGNOSIS — I10 Essential (primary) hypertension: Secondary | ICD-10-CM

## 2014-04-20 DIAGNOSIS — R531 Weakness: Secondary | ICD-10-CM

## 2014-04-20 DIAGNOSIS — D500 Iron deficiency anemia secondary to blood loss (chronic): Secondary | ICD-10-CM

## 2014-04-20 DIAGNOSIS — I5032 Chronic diastolic (congestive) heart failure: Secondary | ICD-10-CM

## 2014-04-20 DIAGNOSIS — N824 Other female intestinal-genital tract fistulae: Secondary | ICD-10-CM

## 2014-04-20 LAB — COMPREHENSIVE METABOLIC PANEL
ALT: 14 U/L (ref 0–35)
AST: 29 U/L (ref 0–37)
Albumin: 3.3 g/dL — ABNORMAL LOW (ref 3.5–5.2)
Alkaline Phosphatase: 59 U/L (ref 39–117)
BUN: 39 mg/dL — ABNORMAL HIGH (ref 6–23)
CO2: 22 mEq/L (ref 19–32)
Calcium: 9.2 mg/dL (ref 8.4–10.5)
Chloride: 107 mEq/L (ref 96–112)
Creatinine, Ser: 1.5 mg/dL — ABNORMAL HIGH (ref 0.4–1.2)
GFR: 34.53 mL/min — ABNORMAL LOW (ref >60.00)
Glucose, Bld: 90 mg/dL (ref 70–99)
Potassium: 5.8 mEq/L — ABNORMAL HIGH (ref 3.5–5.1)
Sodium: 139 mEq/L (ref 135–145)
Total Bilirubin: 0.3 mg/dL (ref 0.2–1.2)
Total Protein: 7.8 g/dL (ref 6.0–8.3)

## 2014-04-20 LAB — CBC WITH DIFFERENTIAL/PLATELET
Basophils Absolute: 0 10*3/uL (ref 0.0–0.1)
Basophils Relative: 0.3 % (ref 0.0–3.0)
Eosinophils Absolute: 0.1 10*3/uL (ref 0.0–0.7)
Eosinophils Relative: 1.1 % (ref 0.0–5.0)
HCT: 25 % — ABNORMAL LOW (ref 36.0–46.0)
Hemoglobin: 7.4 g/dL — CL (ref 12.0–15.0)
Lymphocytes Relative: 26.4 % (ref 12.0–46.0)
Lymphs Abs: 2.6 10*3/uL (ref 0.7–4.0)
MCHC: 29.7 g/dL — ABNORMAL LOW (ref 30.0–36.0)
MCV: 69.4 fl — ABNORMAL LOW (ref 78.0–100.0)
Monocytes Absolute: 0.8 10*3/uL (ref 0.1–1.0)
Monocytes Relative: 8.3 % (ref 3.0–12.0)
Neutro Abs: 6.2 10*3/uL (ref 1.4–7.7)
Neutrophils Relative %: 63.9 % (ref 43.0–77.0)
Platelets: 212 10*3/uL (ref 150.0–400.0)
RBC: 3.6 Mil/uL — ABNORMAL LOW (ref 3.87–5.11)
RDW: 18.1 % — ABNORMAL HIGH (ref 11.5–15.5)
WBC: 9.7 10*3/uL (ref 4.0–10.5)

## 2014-04-20 MED ORDER — LORAZEPAM 0.5 MG PO TABS: 0.5000 mg | ORAL_TABLET | Freq: Every day | ORAL | Status: DC

## 2014-04-20 MED ORDER — HYDROCODONE-ACETAMINOPHEN 5-325 MG PO TABS: 1.0000 | ORAL_TABLET | Freq: Four times a day (QID) | ORAL | Status: DC | PRN

## 2014-04-20 NOTE — Patient Instructions (Signed)
Continue iron twice daily  High fiber diet  Return in 3 months for followup

## 2014-04-20 NOTE — Progress Notes (Signed)
Subjective:    Patient ID: [name], female    DOB: 21-Jan-1930, 80 y.o.   MRN: [id number]  HPI 80 year old patient who is seen today in followup.  She has a long history of a chronic vaginal discharge and was seen by GYN who feels that this is secondary to a colovaginal fistula.  The discharge is unchanged She was hospitalized approximately 3 months ago for anemia, dehydration, and mental status changes.   Hospital records reviewed   Her main complaint today is fairly chronic epigastric discomfort with radiation to the left mid and lower abdominal area.  No nausea or vomiting  Wt Readings from Last 3 Encounters:  04/20/14 176 lb (79.833 kg)  12/21/13 186 lb (84.369 kg)  09/20/13 182 lb 4.8 oz (82.69 kg)    Past Medical History  Diagnosis Date  . ANEMIA 06/30/2007  . COLONIC POLYPS, HX OF 01/21/2007  . COLONIC POLYPS, HX OF 01/21/2007  . DEGENERATIVE JOINT DISEASE 05/19/2007  . DEPRESSION 02/07/2010  . DIASTOLIC HEART FAILURE, CHRONIC 02/27/2009  . DIVERTICULOSIS, COLON 01/21/2007  . DYSPNEA 11/13/2009  . Edema 11/13/2009  . GERD 01/21/2007  . HIATAL HERNIA WITH REFLUX 01/21/2007  . HYPERTENSION 01/21/2007  . LIMB PAIN 11/13/2009  . NECK PAIN, CHRONIC 02/13/2010  . Pure hypercholesterolemia 04/05/2008  . PONV (postoperative nausea and vomiting)     Pt had problems with memory after procedure  . Colitis     Hx: of  . Pneumonia   . Bronchitis     Hx: of  . FHx: allergies     Hx;of  . Heart palpitations     Hx: of  . Headache(784.0)   . CORONARY ARTERY DISEASE 04/05/2008    BMS to RCA 10/2007  . TIA (transient ischemic attack)     History   Social History  . Marital Status: Married    Spouse Name: N/A    Number of Children: N/A  . Years of Education: N/A   Occupational History  . Not on file.   Social History Main Topics  . Smoking status: Former Smoker    Quit date: 07/16/1967  . Smokeless tobacco: Never Used  . Alcohol Use: No  . Drug Use: No  . Sexual Activity: Not on  file   Other Topics Concern  . Not on file   Social History Narrative   Married, 2 children   Right handed   8th grade   1 cup daily    Past Surgical History  Procedure Laterality Date  . Appendectomy    . Cholecystectomy    . Abdominal hysterectomy    . Lumbar laminectomy    . Total knee arthroplasty      3 left knee replacements, 2 right knee replacements  . Rotator cuff repair    . Cardiac stent    . Neck surgery    . Cataract extraction w/ intraocular lens  implant, bilateral      Hx: of  . Cardiac catheterization      2009  . Tonsillectomy    . Dilation and curettage of uterus    . Patellectomy Right 09/14/2013    DR Marlou Sa  . Patellectomy Right 09/14/2013    Procedure: PATELLECTOMY;  Surgeon: Meredith Pel, MD;  Location: [facility];  Service: Orthopedics;  Laterality: Right;    Family History  Problem Relation Age of Onset  . Osteoarthritis Mother   . Heart failure Mother     No Known Allergies  Current Outpatient Prescriptions  on File Prior to Visit  Medication Sig Dispense Refill  . Ascorbic Acid (VITAMIN C) 500 MG CAPS Take 1 tablet by mouth daily.       . baclofen (LIORESAL) 10 MG tablet TAKE 1 TABLET THREE TIMES DAILY  270 tablet  0  . benazepril (LOTENSIN) 40 MG tablet Take 0.5 tablets (20 mg total) by mouth daily.      . benzonatate (TESSALON) 100 MG capsule Take 100 mg by mouth 3 (three) times daily as needed for cough.      . cholecalciferol (VITAMIN D) 1000 UNITS tablet Take 1,000 Units by mouth daily.      . ferrous sulfate 325 (65 FE) MG tablet Take 1 tablet (325 mg total) by mouth 2 (two) times daily with a meal.    3  . gabapentin (NEURONTIN) 600 MG tablet Take 600 mg by mouth daily.      . metoprolol (LOPRESSOR) 50 MG tablet Take 1 tablet (50 mg total) by mouth 2 (two) times daily.  180 tablet  3  . Multiple Vitamins-Minerals (MULTIVITAMIN WITH MINERALS) tablet Take 1 tablet by mouth daily.      . nitroGLYCERIN (NITROSTAT) 0.4 MG SL tablet Place  0.4 mg under the tongue every 5 (five) minutes as needed for chest pain.       Marland Kitchen sertraline (ZOLOFT) 50 MG tablet TAKE 1 TABLET EVERY DAY  90 tablet  1  . simvastatin (ZOCOR) 40 MG tablet Take 1 tablet (40 mg total) by mouth at bedtime.  90 tablet  3  . spironolactone (ALDACTONE) 25 MG tablet Take 1 tablet (25 mg total) by mouth daily.  90 tablet  3  . traMADol (ULTRAM) 50 MG tablet Take 50 mg by mouth every 8 (eight) hours.       . traZODone (DESYREL) 100 MG tablet Take 50 mg by mouth at bedtime.      . triamcinolone cream (KENALOG) 0.1 % Apply 1 application topically 2 (two) times daily as needed (Eczema on legs).       No current facility-administered medications on file prior to visit.    BP 150/78  Pulse 69  Temp(Src) 97.9 F (36.6 C) (Oral)  Resp 20  Ht 5\' 3"  (1.6 m)  Wt 176 lb (79.833 kg)  BMI 31.18 kg/m2  SpO2 98%      Review of Systems  Constitutional: Positive for fatigue.  HENT: Negative for congestion, dental problem, hearing loss, rhinorrhea, sinus pressure, sore throat and tinnitus.   Eyes: Negative for pain, discharge and visual disturbance.  Respiratory: Negative for cough and shortness of breath.   Cardiovascular: Negative for chest pain, palpitations and leg swelling.  Gastrointestinal: Positive for abdominal pain. Negative for nausea, vomiting, diarrhea, constipation, blood in stool and abdominal distention.  Genitourinary: Positive for vaginal discharge. Negative for dysuria, urgency, frequency, hematuria, flank pain, vaginal bleeding, difficulty urinating, vaginal pain and pelvic pain.  Musculoskeletal: Negative for arthralgias, gait problem and joint swelling.  Skin: Negative for rash.  Neurological: Positive for weakness. Negative for dizziness, syncope, speech difficulty, numbness and headaches.  Hematological: Negative for adenopathy.  Psychiatric/Behavioral: Negative for behavioral problems, dysphoric mood and agitation. The patient is not  nervous/anxious.        Objective:   Physical Exam  Constitutional: She is oriented to person, place, and time. She appears well-developed and well-nourished.  HENT:  Head: Normocephalic.  Right Ear: External ear normal.  Left Ear: External ear normal.  Mouth/Throat: Oropharynx is clear and moist.  Eyes:  Conjunctivae and EOM are normal. Pupils are equal, round, and reactive to light.  Neck: Normal range of motion. Neck supple. No thyromegaly present.  Cardiovascular: Normal rate, regular rhythm and normal heart sounds.   Pulmonary/Chest: Effort normal and breath sounds normal.  Abdominal: Soft. Bowel sounds are normal. She exhibits no mass. There is no tenderness.  Musculoskeletal: Normal range of motion.  Lymphadenopathy:    She has no cervical adenopathy.  Neurological: She is alert and oriented to person, place, and time.  Skin: Skin is warm and dry. No rash noted.  Psychiatric: She has a normal mood and affect. Her behavior is normal.          Assessment & Plan:   Chronic colovaginal fistula Chronic anemia.  We'll check CBC and continue iron supplementation Abdominal pain.  Patient will consider abdominal CT.  Does not desire surgery for fistula unless symptoms intensify Coronary artery disease Chronic diastolic heart failure.  Compensated

## 2014-04-20 NOTE — Telephone Encounter (Signed)
Pt has a critical lab, Hgb 7.5. Per Dr. Yong Channel pt previous was a 8.3, await advice from Dr. Raliegh Ip.

## 2014-04-20 NOTE — Progress Notes (Signed)
Pre visit review using our clinic review tool, if applicable. No additional management support is needed unless otherwise documented below in the visit note. 

## 2014-04-20 NOTE — Telephone Encounter (Signed)
Chronic anemia noted with mild decrease since last labs. Did not see any red flags for need for transfusion from visit yesterday and patient has been as low as 7.9 about 7 months ago. Dr. Raliegh Ip will follow this up tomorrow.

## 2014-04-25 ENCOUNTER — Ambulatory Visit (HOSPITAL_COMMUNITY)
Admission: RE | Admit: 2014-04-25 | Discharge: 2014-04-25 | Disposition: A | Discharge status: Home / Self Care | Payer: Medicare HMO | Source: Ambulatory Visit | Attending: Internal Medicine | Admitting: Internal Medicine

## 2014-04-25 ENCOUNTER — Other Ambulatory Visit: Payer: Self-pay | Admitting: Internal Medicine

## 2014-04-25 DIAGNOSIS — D649 Anemia, unspecified: Secondary | ICD-10-CM

## 2014-04-25 DIAGNOSIS — N824 Other female intestinal-genital tract fistulae: Secondary | ICD-10-CM

## 2014-04-25 DIAGNOSIS — R1013 Epigastric pain: Secondary | ICD-10-CM

## 2014-04-25 DIAGNOSIS — R1084 Generalized abdominal pain: Secondary | ICD-10-CM

## 2014-04-26 ENCOUNTER — Encounter (HOSPITAL_COMMUNITY): Payer: Self-pay

## 2014-04-26 ENCOUNTER — Other Ambulatory Visit (HOSPITAL_COMMUNITY): Payer: Self-pay | Admitting: *Deleted

## 2014-04-26 ENCOUNTER — Ambulatory Visit (HOSPITAL_COMMUNITY)
Admission: RE | Admit: 2014-04-26 | Discharge: 2014-04-26 | Disposition: A | Discharge status: Home / Self Care | Payer: Medicare HMO | Source: Ambulatory Visit | Attending: Internal Medicine | Admitting: Internal Medicine

## 2014-04-26 DIAGNOSIS — D649 Anemia, unspecified: Secondary | ICD-10-CM | POA: Diagnosis present

## 2014-04-26 LAB — PREPARE RBC (CROSSMATCH)

## 2014-04-26 MED ORDER — SODIUM CHLORIDE 0.9 % IV SOLN
Freq: Once | INTRAVENOUS | Status: AC
  Administered 2014-04-26: 09:00:00 via INTRAVENOUS

## 2014-04-26 NOTE — Discharge Instructions (Signed)

## 2014-04-27 LAB — TYPE AND SCREEN
ABO/RH(D): A POS
Antibody Screen: NEGATIVE
Unit division: 0
Unit division: 0

## 2014-04-28 ENCOUNTER — Ambulatory Visit (INDEPENDENT_AMBULATORY_CARE_PROVIDER_SITE_OTHER)
Admission: RE | Admit: 2014-04-28 | Discharge: 2014-04-28 | Disposition: A | Discharge status: Home / Self Care | Payer: Commercial Managed Care - HMO | Source: Ambulatory Visit | Attending: Internal Medicine | Admitting: Internal Medicine

## 2014-04-28 DIAGNOSIS — R1084 Generalized abdominal pain: Secondary | ICD-10-CM

## 2014-04-28 DIAGNOSIS — D649 Anemia, unspecified: Secondary | ICD-10-CM

## 2014-04-28 DIAGNOSIS — N824 Other female intestinal-genital tract fistulae: Secondary | ICD-10-CM

## 2014-04-28 MED ORDER — IOHEXOL 300 MG/ML  SOLN
100.0000 mL | Freq: Once | INTRAMUSCULAR | Status: AC | PRN
  Administered 2014-04-28: 100 mL via INTRAVENOUS

## 2014-05-03 ENCOUNTER — Telehealth: Payer: Self-pay | Admitting: Internal Medicine

## 2014-05-03 ENCOUNTER — Other Ambulatory Visit: Payer: Self-pay | Admitting: *Deleted

## 2014-05-03 MED ORDER — SPIRONOLACTONE 25 MG PO TABS: 25.0000 mg | ORAL_TABLET | Freq: Every day | ORAL | Status: DC

## 2014-05-03 MED ORDER — TRAZODONE HCL 100 MG PO TABS
50.0000 mg | ORAL_TABLET | Freq: Every day | ORAL | Status: DC
Start: 2014-05-03 — End: 2014-06-10

## 2014-05-03 NOTE — Telephone Encounter (Signed)
Please advise 

## 2014-05-03 NOTE — Telephone Encounter (Signed)
Called and discussed the possibility of a colovesical fistula as well

## 2014-05-18 ENCOUNTER — Telehealth: Payer: Self-pay | Admitting: Internal Medicine

## 2014-05-18 NOTE — Telephone Encounter (Addendum)
[place] [facility] states [employer] thn needs authorization/referral. Deborha Payment are aware referral will be sent. Thanks. Fax  [phone] Attn:  Peyton Najjar

## 2014-05-18 NOTE — Telephone Encounter (Signed)
Done  Called pt to inform we have completed this #[id number]

## 2014-05-24 ENCOUNTER — Telehealth: Payer: Self-pay | Admitting: Internal Medicine

## 2014-05-24 NOTE — Telephone Encounter (Signed)
Pt states she had some blood in her stool and what sounds like a rectocele. States she was seen by [facility] and was told that she needed to see Dr. Henrene Pastor before any surgery was done. Pt scheduled to see Dr. Henrene Pastor 07/22/14@9 :30am. Pt aware of appt.

## 2014-05-31 ENCOUNTER — Inpatient Hospital Stay (HOSPITAL_COMMUNITY)
Admission: EM | Admit: 2014-05-31 | Discharge: 2014-06-10 | DRG: 329 | Disposition: A | Discharge status: Skilled Nursing Facility | Payer: Medicare HMO | Attending: Internal Medicine | Admitting: Internal Medicine

## 2014-05-31 ENCOUNTER — Telehealth: Payer: Self-pay | Admitting: Internal Medicine

## 2014-05-31 ENCOUNTER — Encounter (HOSPITAL_COMMUNITY): Payer: Self-pay | Admitting: *Deleted

## 2014-05-31 ENCOUNTER — Emergency Department (HOSPITAL_COMMUNITY): Payer: Medicare HMO

## 2014-05-31 DIAGNOSIS — C180 Malignant neoplasm of cecum: Principal | ICD-10-CM | POA: Diagnosis present

## 2014-05-31 DIAGNOSIS — I5033 Acute on chronic diastolic (congestive) heart failure: Secondary | ICD-10-CM | POA: Diagnosis not present

## 2014-05-31 DIAGNOSIS — K56609 Unspecified intestinal obstruction, unspecified as to partial versus complete obstruction: Secondary | ICD-10-CM

## 2014-05-31 DIAGNOSIS — E876 Hypokalemia: Secondary | ICD-10-CM | POA: Diagnosis not present

## 2014-05-31 DIAGNOSIS — Z87891 Personal history of nicotine dependence: Secondary | ICD-10-CM

## 2014-05-31 DIAGNOSIS — K5790 Diverticulosis of intestine, part unspecified, without perforation or abscess without bleeding: Secondary | ICD-10-CM | POA: Diagnosis present

## 2014-05-31 DIAGNOSIS — Z79899 Other long term (current) drug therapy: Secondary | ICD-10-CM

## 2014-05-31 DIAGNOSIS — Z96652 Presence of left artificial knee joint: Secondary | ICD-10-CM | POA: Diagnosis present

## 2014-05-31 DIAGNOSIS — Z8673 Personal history of transient ischemic attack (TIA), and cerebral infarction without residual deficits: Secondary | ICD-10-CM

## 2014-05-31 DIAGNOSIS — F329 Major depressive disorder, single episode, unspecified: Secondary | ICD-10-CM | POA: Diagnosis present

## 2014-05-31 DIAGNOSIS — Z86010 Personal history of colonic polyps: Secondary | ICD-10-CM | POA: Diagnosis not present

## 2014-05-31 DIAGNOSIS — I10 Essential (primary) hypertension: Secondary | ICD-10-CM | POA: Diagnosis present

## 2014-05-31 DIAGNOSIS — F419 Anxiety disorder, unspecified: Secondary | ICD-10-CM | POA: Diagnosis not present

## 2014-05-31 DIAGNOSIS — D374 Neoplasm of uncertain behavior of colon: Secondary | ICD-10-CM

## 2014-05-31 DIAGNOSIS — Z96651 Presence of right artificial knee joint: Secondary | ICD-10-CM | POA: Diagnosis present

## 2014-05-31 DIAGNOSIS — I2510 Atherosclerotic heart disease of native coronary artery without angina pectoris: Secondary | ICD-10-CM | POA: Diagnosis present

## 2014-05-31 DIAGNOSIS — Z01810 Encounter for preprocedural cardiovascular examination: Secondary | ICD-10-CM

## 2014-05-31 DIAGNOSIS — K5669 Other intestinal obstruction: Secondary | ICD-10-CM | POA: Diagnosis present

## 2014-05-31 DIAGNOSIS — Z8249 Family history of ischemic heart disease and other diseases of the circulatory system: Secondary | ICD-10-CM

## 2014-05-31 DIAGNOSIS — T501X5A Adverse effect of loop [high-ceiling] diuretics, initial encounter: Secondary | ICD-10-CM | POA: Diagnosis not present

## 2014-05-31 DIAGNOSIS — D500 Iron deficiency anemia secondary to blood loss (chronic): Secondary | ICD-10-CM | POA: Diagnosis present

## 2014-05-31 DIAGNOSIS — E780 Pure hypercholesterolemia: Secondary | ICD-10-CM | POA: Diagnosis present

## 2014-05-31 DIAGNOSIS — C772 Secondary and unspecified malignant neoplasm of intra-abdominal lymph nodes: Secondary | ICD-10-CM | POA: Diagnosis present

## 2014-05-31 DIAGNOSIS — R109 Unspecified abdominal pain: Secondary | ICD-10-CM | POA: Diagnosis present

## 2014-05-31 DIAGNOSIS — I5032 Chronic diastolic (congestive) heart failure: Secondary | ICD-10-CM | POA: Diagnosis present

## 2014-05-31 DIAGNOSIS — N824 Other female intestinal-genital tract fistulae: Secondary | ICD-10-CM | POA: Diagnosis present

## 2014-05-31 DIAGNOSIS — K567 Ileus, unspecified: Secondary | ICD-10-CM

## 2014-05-31 DIAGNOSIS — Z955 Presence of coronary angioplasty implant and graft: Secondary | ICD-10-CM | POA: Diagnosis not present

## 2014-05-31 DIAGNOSIS — D490 Neoplasm of unspecified behavior of digestive system: Secondary | ICD-10-CM | POA: Diagnosis present

## 2014-05-31 DIAGNOSIS — R5381 Other malaise: Secondary | ICD-10-CM | POA: Diagnosis not present

## 2014-05-31 DIAGNOSIS — Z9849 Cataract extraction status, unspecified eye: Secondary | ICD-10-CM

## 2014-05-31 DIAGNOSIS — R6881 Early satiety: Secondary | ICD-10-CM | POA: Diagnosis present

## 2014-05-31 DIAGNOSIS — R0600 Dyspnea, unspecified: Secondary | ICD-10-CM

## 2014-05-31 LAB — COMPREHENSIVE METABOLIC PANEL
ALT: 12 U/L (ref 0–35)
AST: 29 U/L (ref 0–37)
Albumin: 3.4 g/dL — ABNORMAL LOW (ref 3.5–5.2)
Alkaline Phosphatase: 68 U/L (ref 39–117)
Anion gap: 17 — ABNORMAL HIGH (ref 5–15)
BUN: 23 mg/dL (ref 6–23)
CO2: 21 mEq/L (ref 19–32)
Calcium: 9.2 mg/dL (ref 8.4–10.5)
Chloride: 101 mEq/L (ref 96–112)
Creatinine, Ser: 1.02 mg/dL (ref 0.50–1.10)
GFR calc Af Amer: 58 mL/min — ABNORMAL LOW (ref >90)
GFR calc non Af Amer: 50 mL/min — ABNORMAL LOW (ref >90)
Glucose, Bld: 173 mg/dL — ABNORMAL HIGH (ref 70–99)
Potassium: 4.5 mEq/L (ref 3.7–5.3)
Sodium: 139 mEq/L (ref 137–147)
Total Bilirubin: 0.2 mg/dL — ABNORMAL LOW (ref 0.3–1.2)
Total Protein: 7.5 g/dL (ref 6.0–8.3)

## 2014-05-31 LAB — URINALYSIS, ROUTINE W REFLEX MICROSCOPIC
Bilirubin Urine: NEGATIVE
Glucose, UA: NEGATIVE mg/dL
Hgb urine dipstick: NEGATIVE
Ketones, ur: 15 mg/dL — AB
Leukocytes, UA: NEGATIVE
Nitrite: NEGATIVE
Protein, ur: 30 mg/dL — AB
Specific Gravity, Urine: >1.03 — ABNORMAL HIGH (ref 1.005–1.030)
Urobilinogen, UA: 0.2 mg/dL (ref 0.0–1.0)
pH: 5.5 (ref 5.0–8.0)

## 2014-05-31 LAB — CBC WITH DIFFERENTIAL/PLATELET
Basophils Absolute: 0 10*3/uL (ref 0.0–0.1)
Basophils Relative: 0 % (ref 0–1)
Eosinophils Absolute: 0 10*3/uL (ref 0.0–0.7)
Eosinophils Relative: 0 % (ref 0–5)
HCT: 32 % — ABNORMAL LOW (ref 36.0–46.0)
Hemoglobin: 9.6 g/dL — ABNORMAL LOW (ref 12.0–15.0)
Lymphocytes Relative: 14 % (ref 12–46)
Lymphs Abs: 1.1 10*3/uL (ref 0.7–4.0)
MCH: 23.7 pg — ABNORMAL LOW (ref 26.0–34.0)
MCHC: 30 g/dL (ref 30.0–36.0)
MCV: 79 fL (ref 78.0–100.0)
Monocytes Absolute: 0.3 10*3/uL (ref 0.1–1.0)
Monocytes Relative: 4 % (ref 3–12)
Neutro Abs: 6.1 10*3/uL (ref 1.7–7.7)
Neutrophils Relative %: 82 % — ABNORMAL HIGH (ref 43–77)
Platelets: 237 10*3/uL (ref 150–400)
RBC: 4.05 MIL/uL (ref 3.87–5.11)
RDW: 23.7 % — ABNORMAL HIGH (ref 11.5–15.5)
WBC: 7.5 10*3/uL (ref 4.0–10.5)

## 2014-05-31 LAB — URINE MICROSCOPIC-ADD ON

## 2014-05-31 LAB — PROTIME-INR
INR: 1.19 (ref 0.00–1.49)
Prothrombin Time: 15.2 seconds (ref 11.6–15.2)

## 2014-05-31 LAB — TSH: TSH: 4.16 u[IU]/mL (ref 0.350–4.500)

## 2014-05-31 LAB — I-STAT TROPONIN, ED: Troponin i, poc: 0 ng/mL (ref 0.00–0.08)

## 2014-05-31 LAB — MRSA PCR SCREENING: MRSA by PCR: NEGATIVE

## 2014-05-31 MED ORDER — ACETAMINOPHEN 650 MG RE SUPP: 650.0000 mg | Freq: Four times a day (QID) | RECTAL | Status: DC | PRN

## 2014-05-31 MED ORDER — ONDANSETRON HCL 4 MG PO TABS: 4.0000 mg | ORAL_TABLET | Freq: Four times a day (QID) | ORAL | Status: DC | PRN

## 2014-05-31 MED ORDER — SERTRALINE HCL 50 MG PO TABS
50.0000 mg | ORAL_TABLET | Freq: Every day | ORAL | Status: DC
  Administered 2014-06-01 – 2014-06-06 (×5): 50 mg via ORAL
  Filled 2014-05-31 (×8): qty 1

## 2014-05-31 MED ORDER — MORPHINE SULFATE 2 MG/ML IJ SOLN
1.0000 mg | INTRAMUSCULAR | Status: DC | PRN
  Administered 2014-05-31 – 2014-06-01 (×6): 1 mg via INTRAVENOUS
  Filled 2014-05-31 (×6): qty 1

## 2014-05-31 MED ORDER — METOCLOPRAMIDE HCL 5 MG/ML IJ SOLN
10.0000 mg | Freq: Once | INTRAMUSCULAR | Status: AC
  Administered 2014-05-31: 10 mg via INTRAVENOUS
  Filled 2014-05-31: qty 2

## 2014-05-31 MED ORDER — IOHEXOL 300 MG/ML  SOLN
80.0000 mL | Freq: Once | INTRAMUSCULAR | Status: AC | PRN
  Administered 2014-05-31: 80 mL via INTRAVENOUS

## 2014-05-31 MED ORDER — HYDROMORPHONE HCL 1 MG/ML IJ SOLN
1.0000 mg | Freq: Once | INTRAMUSCULAR | Status: AC
  Administered 2014-05-31: 1 mg via INTRAVENOUS
  Filled 2014-05-31: qty 1

## 2014-05-31 MED ORDER — SODIUM CHLORIDE 0.9 % IV SOLN
INTRAVENOUS | Status: DC
  Administered 2014-05-31: 14:00:00 via INTRAVENOUS

## 2014-05-31 MED ORDER — ACETAMINOPHEN 325 MG PO TABS
650.0000 mg | ORAL_TABLET | Freq: Four times a day (QID) | ORAL | Status: DC | PRN
Start: 2014-05-31 — End: 2014-06-10
  Administered 2014-06-02 – 2014-06-05 (×3): 650 mg via ORAL
  Filled 2014-05-31 (×3): qty 2

## 2014-05-31 MED ORDER — IOHEXOL 300 MG/ML  SOLN
25.0000 mL | Freq: Once | INTRAMUSCULAR | Status: AC | PRN
  Administered 2014-05-31: 25 mL via ORAL

## 2014-05-31 MED ORDER — MORPHINE SULFATE 4 MG/ML IJ SOLN
4.0000 mg | Freq: Once | INTRAMUSCULAR | Status: AC
  Administered 2014-05-31: 4 mg via INTRAVENOUS
  Filled 2014-05-31: qty 1

## 2014-05-31 MED ORDER — PROMETHAZINE HCL 25 MG/ML IJ SOLN
12.5000 mg | Freq: Once | INTRAMUSCULAR | Status: AC
  Administered 2014-05-31: 12.5 mg via INTRAVENOUS
  Filled 2014-05-31: qty 1

## 2014-05-31 MED ORDER — ONDANSETRON 4 MG PO TBDP
8.0000 mg | ORAL_TABLET | Freq: Once | ORAL | Status: AC
  Administered 2014-05-31: 8 mg via ORAL
  Filled 2014-05-31: qty 2

## 2014-05-31 MED ORDER — ONDANSETRON HCL 4 MG/2ML IJ SOLN
4.0000 mg | Freq: Once | INTRAMUSCULAR | Status: AC
  Administered 2014-05-31: 4 mg via INTRAVENOUS
  Filled 2014-05-31: qty 2

## 2014-05-31 MED ORDER — KCL IN DEXTROSE-NACL 10-5-0.45 MEQ/L-%-% IV SOLN
INTRAVENOUS | Status: DC
  Administered 2014-05-31 – 2014-06-01 (×2): via INTRAVENOUS
  Filled 2014-05-31 (×4): qty 1000

## 2014-05-31 MED ORDER — METOPROLOL TARTRATE 50 MG PO TABS
50.0000 mg | ORAL_TABLET | Freq: Two times a day (BID) | ORAL | Status: DC
  Administered 2014-05-31 – 2014-06-06 (×13): 50 mg via ORAL
  Filled 2014-05-31 (×19): qty 1

## 2014-05-31 MED ORDER — ONDANSETRON HCL 4 MG/2ML IJ SOLN
4.0000 mg | Freq: Four times a day (QID) | INTRAMUSCULAR | Status: DC | PRN
  Administered 2014-05-31 – 2014-06-01 (×2): 4 mg via INTRAVENOUS
  Filled 2014-05-31 (×2): qty 2

## 2014-05-31 MED ORDER — MORPHINE SULFATE 4 MG/ML IJ SOLN: 4.0000 mg | Freq: Once | INTRAMUSCULAR | Status: DC

## 2014-05-31 MED ORDER — HEPARIN SODIUM (PORCINE) 5000 UNIT/ML IJ SOLN
5000.0000 [IU] | Freq: Three times a day (TID) | INTRAMUSCULAR | Status: DC
  Administered 2014-05-31 – 2014-06-01 (×4): 5000 [IU] via SUBCUTANEOUS
  Filled 2014-05-31 (×8): qty 1

## 2014-05-31 MED ORDER — SODIUM CHLORIDE 0.9 % IJ SOLN
3.0000 mL | Freq: Two times a day (BID) | INTRAMUSCULAR | Status: DC
  Administered 2014-05-31 – 2014-06-09 (×10): 3 mL via INTRAVENOUS

## 2014-05-31 MED ORDER — ONDANSETRON HCL 4 MG/2ML IJ SOLN: 4.0000 mg | Freq: Once | INTRAMUSCULAR | Status: DC

## 2014-05-31 MED ORDER — MORPHINE SULFATE 2 MG/ML IJ SOLN
2.0000 mg | Freq: Once | INTRAMUSCULAR | Status: AC
Start: 2014-05-31 — End: 2014-05-31
  Administered 2014-05-31: 2 mg via INTRAVENOUS
  Filled 2014-05-31: qty 1

## 2014-05-31 MED ORDER — HYDROCODONE-ACETAMINOPHEN 5-325 MG PO TABS
1.0000 | ORAL_TABLET | ORAL | Status: DC | PRN
  Administered 2014-06-01 – 2014-06-02 (×3): 2 via ORAL
  Filled 2014-05-31 (×3): qty 2

## 2014-05-31 NOTE — Telephone Encounter (Signed)
Left message to call back.  Called and spoke with pts daughter and pt was admitted to the hospital yesterday.

## 2014-05-31 NOTE — ED Notes (Addendum)
Severe abd. Pain for 5-6 days. Worse today. Having dry heaves ([name]). No diarrhea for 4 days. Takes iron - stools black.

## 2014-05-31 NOTE — H&P (Signed)
[facility] History and Physical  [name] WUJ:[id number] DOB: 1930/01/31 DOA: 05/31/2014  Referring physician: EDP PCP: Nyoka Cowden, MD   Chief Complaint: abdominal pain  HPI: [name] is a 80 y.o. female with past medical history of chronic anemia, chronic diastolic CHF, colovaginal fistula and diverticulosis came into the hospital because of abdominal pain. Patient mentioned that she has chronic abdominal pain which was getting worse for the past couple of weeks, she complains about nausea, vomited once earlier today but no severe weight loss. She still passing flatus and having bowel movements. In the ED CT scan of abdomen/pelvis showed 6 cm apple core lesion involving the ileocecal valve concerning for colon cancer. General surgery consulted and recommended medical service admission because of medical problems, recommended GI consultation as well.  Review of Systems:  Constitutional: negative for anorexia, fevers and sweats Eyes: negative for irritation, redness and visual disturbance Ears, nose, mouth, throat, and face: negative for earaches, epistaxis, nasal congestion and sore throat Respiratory: negative for cough, dyspnea on exertion, sputum and wheezing Cardiovascular: negative for chest pain, dyspnea, lower extremity edema, orthopnea, palpitations and syncope Gastrointestinal: abdominal pain, nausea and vomiting. Genitourinary:negative for dysuria, frequency and hematuria Hematologic/lymphatic: negative for bleeding, easy bruising and lymphadenopathy Musculoskeletal:negative for arthralgias, muscle weakness and stiff joints Neurological: negative for coordination problems, gait problems, headaches and weakness Endocrine: negative for diabetic symptoms including polydipsia, polyuria and weight loss Allergic/Immunologic: negative for anaphylaxis, hay fever and urticaria  Past Medical History  Diagnosis Date  . ANEMIA 06/30/2007  . COLONIC POLYPS, HX  OF 01/21/2007  . COLONIC POLYPS, HX OF 01/21/2007  . DEGENERATIVE JOINT DISEASE 05/19/2007  . DEPRESSION 02/07/2010  . DIASTOLIC HEART FAILURE, CHRONIC 02/27/2009  . DIVERTICULOSIS, COLON 01/21/2007  . DYSPNEA 11/13/2009  . Edema 11/13/2009  . GERD 01/21/2007  . HIATAL HERNIA WITH REFLUX 01/21/2007  . HYPERTENSION 01/21/2007  . LIMB PAIN 11/13/2009  . NECK PAIN, CHRONIC 02/13/2010  . Pure hypercholesterolemia 04/05/2008  . PONV (postoperative nausea and vomiting)     Pt had problems with memory after procedure  . Colitis     Hx: of  . Pneumonia   . Bronchitis     Hx: of  . FHx: allergies     Hx;of  . Heart palpitations     Hx: of  . Headache(784.0)   . CORONARY ARTERY DISEASE 04/05/2008    BMS to RCA 10/2007  . TIA (transient ischemic attack)    Past Surgical History  Procedure Laterality Date  . Appendectomy    . Cholecystectomy    . Abdominal hysterectomy    . Lumbar laminectomy    . Total knee arthroplasty      3 left knee replacements, 2 right knee replacements  . Rotator cuff repair    . Cardiac stent    . Neck surgery    . Cataract extraction w/ intraocular lens  implant, bilateral      Hx: of  . Cardiac catheterization      2009  . Tonsillectomy    . Dilation and curettage of uterus    . Patellectomy Right 09/14/2013    DR Marlou Sa  . Patellectomy Right 09/14/2013    Procedure: PATELLECTOMY;  Surgeon: Meredith Pel, MD;  Location: [facility];  Service: Orthopedics;  Laterality: Right;   Social History:   reports that she quit smoking about 46 years ago. She has never used smokeless tobacco. She reports that she does not drink alcohol or use illicit drugs.  No Known Allergies  Family History  Problem Relation Age of Onset  . Osteoarthritis Mother   . Heart failure Mother      Prior to Admission medications   Medication Sig Start Date End Date Taking? Authorizing Provider  amoxicillin-clavulanate (AUGMENTIN) 875-125 MG per tablet Take 1 tablet by mouth 2 (two) times daily. For  30 days 05/19/14  Yes Historical Provider, MD  Ascorbic Acid (VITAMIN C) 500 MG CAPS Take 1 tablet by mouth daily.    Yes Historical Provider, MD  baclofen (LIORESAL) 10 MG tablet Take 10 mg by mouth 3 (three) times daily.   Yes Historical Provider, MD  benazepril (LOTENSIN) 40 MG tablet Take 0.5 tablets (20 mg total) by mouth daily. 09/18/13  Yes Adeline Saralyn Pilar, MD  benzonatate (TESSALON) 100 MG capsule Take 100 mg by mouth 3 (three) times daily as needed for cough.   Yes Historical Provider, MD  cholecalciferol (VITAMIN D) 1000 UNITS tablet Take 1,000 Units by mouth daily.   Yes Historical Provider, MD  ferrous sulfate 325 (65 FE) MG tablet Take 1 tablet (325 mg total) by mouth 2 (two) times daily with a meal. 12/30/13  Yes Estela Leonie Green, MD  furosemide (LASIX) 20 MG tablet Take 20 mg by mouth daily. 05/02/14  Yes Historical Provider, MD  gabapentin (NEURONTIN) 600 MG tablet Take 600 mg by mouth daily.   Yes Historical Provider, MD  HYDROcodone-acetaminophen (NORCO) 5-325 MG per tablet Take 1-2 tablets by mouth every 6 (six) hours as needed for moderate pain. 04/20/14  Yes Marletta Lor, MD  LORazepam (ATIVAN) 0.5 MG tablet Take 1 tablet (0.5 mg total) by mouth at bedtime. 04/20/14  Yes Marletta Lor, MD  metoprolol (LOPRESSOR) 50 MG tablet Take 1 tablet (50 mg total) by mouth 2 (two) times daily. 09/02/13  Yes Marletta Lor, MD  Multiple Vitamins-Minerals (MULTIVITAMIN WITH MINERALS) tablet Take 1 tablet by mouth daily.   Yes Historical Provider, MD  nitroGLYCERIN (NITROSTAT) 0.4 MG SL tablet Place 0.4 mg under the tongue every 5 (five) minutes as needed for chest pain.    Yes Historical Provider, MD  sertraline (ZOLOFT) 50 MG tablet Take 50 mg by mouth daily.   Yes Historical Provider, MD  simvastatin (ZOCOR) 40 MG tablet Take 1 tablet (40 mg total) by mouth at bedtime. 09/02/13  Yes Marletta Lor, MD  spironolactone (ALDACTONE) 25 MG tablet Take 1 tablet (25 mg  total) by mouth daily. 05/03/14  Yes Marletta Lor, MD  traMADol (ULTRAM) 50 MG tablet Take 50 mg by mouth every 8 (eight) hours.    Yes Historical Provider, MD  traZODone (DESYREL) 100 MG tablet Take 0.5 tablets (50 mg total) by mouth at bedtime. 05/03/14  Yes Marletta Lor, MD  triamcinolone cream (KENALOG) 0.1 % Apply 1 application topically 2 (two) times daily as needed (Eczema on legs).   Yes Historical Provider, MD   Physical Exam: Filed Vitals:   05/31/14 1730  BP: 121/60  Pulse: 82  Temp:   Resp: 21   Constitutional: Oriented to person, place, and time. Well-developed and well-nourished. Cooperative.  Head: Normocephalic and atraumatic.  Nose: Nose normal.  Mouth/Throat: Uvula is midline, oropharynx is clear and moist and mucous membranes are normal.  Eyes: Conjunctivae and EOM are normal. Pupils are equal, round, and reactive to light.  Neck: Trachea normal and normal range of motion. Neck supple.  Cardiovascular: Normal rate, regular rhythm, S1 normal, S2 normal, normal heart sounds and  intact distal pulses.   Pulmonary/Chest: Effort normal and breath sounds normal.  Abdominal: Soft. Bowel sounds are normal. There is no hepatosplenomegaly. There is no tenderness.  Musculoskeletal: Normal range of motion.  Neurological: Alert and oriented to person, place, and time. Has normal strength. No cranial nerve deficit or sensory deficit.  Skin: Skin is warm, dry and intact.  Psychiatric: Has a normal mood and affect. Speech is normal and behavior is normal.   Labs on Admission:  Basic Metabolic Panel:  Recent Labs Lab 05/31/14 1204  NA 139  K 4.5  CL 101  CO2 21  GLUCOSE 173*  BUN 23  CREATININE 1.02  CALCIUM 9.2   Liver Function Tests:  Recent Labs Lab 05/31/14 1204  AST 29  ALT 12  ALKPHOS 68  BILITOT 0.2*  PROT 7.5  ALBUMIN 3.4*   No results for input(s): LIPASE, AMYLASE in the last 168 hours. No results for input(s): AMMONIA in the last 168  hours. CBC:  Recent Labs Lab 05/31/14 1204  WBC 7.5  NEUTROABS 6.1  HGB 9.6*  HCT 32.0*  MCV 79.0  PLT 237   Cardiac Enzymes: No results for input(s): CKTOTAL, CKMB, CKMBINDEX, TROPONINI in the last 168 hours.  BNP (last 3 results)  Recent Labs  09/15/13 1640 12/27/13 1107  PROBNP 1858.0* 1267.0*   CBG: No results for input(s): GLUCAP in the last 168 hours.  Radiological Exams on Admission: Ct Abdomen Pelvis W Contrast  05/31/2014   ADDENDUM REPORT: 05/31/2014 16:27  ADDENDUM: Study discussed by telephone with Dr. Scot Jun On 05/31/2014 at 1625 hrs.   Electronically Signed   By: Lars Pinks M.D.   On: 05/31/2014 16:27   05/31/2014   CLINICAL DATA:  80 year old female with right side abdominal pain, severe in constant for 2 days. Diarrhea. Initial encounter.  EXAM: CT ABDOMEN AND PELVIS WITH CONTRAST  TECHNIQUE: Multidetector CT imaging of the abdomen and pelvis was performed using the standard protocol following bolus administration of intravenous contrast.  CONTRAST:  96mL OMNIPAQUE IOHEXOL 300 MG/ML  SOLN  COMPARISON:  04/28/2014 and earlier.  FINDINGS: Negative lung bases. Mild cardiomegaly. No pericardial or pleural effusion.  Stable visualized osseous structures.  Moderate volume of free fluid in the pelvis is new. Decompressed bladder. Negative rectum. Sigmoid diverticulosis re - identified, no definite adjacent mesenteric inflammation.  Decompressed left colon, transverse colon, at hepatic flexure.  Soft tissue mass like thickening at the ileocecal valve. The cecum is largely decompressed, but the small bowel is been diffusely dilated distally, with a gradual transition to more normal caliber proximal small bowel. The mass like soft tissue appears to affect both this cecum and terminal ileum, measuring up to 6 cm in length, by up to 4 cm in thickness. Pericecal lymph nodes are identified and appear stable small distal small bowel mesenteric nodes up to 8 mm short axis.  In  addition to free fluid in the pelvis and a small bowel mesentery there is a small volume of fluid along the liver. No liver mass identified. Surgically absent gallbladder as before.  Small volume perisplenic fluid. Spleen, pancreas, adrenal glands, and kidneys are stable. Portal venous system is patent. Major arterial structures in the abdomen and pelvis are patent with aortoiliac calcified atherosclerosis noted. No free air. Moderate volume gastric paraesophageal or hiatal hernia contains contrast today. This has not significantly changed.  IMPRESSION: Acute small bowel obstruction due to what appears to be a soft tissue mass (apple core lesion) at the ileocecal  valve suspicious for intestinal adenocarcinoma. Up to 8 mm pericecal lymph nodes are stable. No liver or distant metastasis identified. Small volume free fluid in the abdomen and pelvis, favor reactive. No free air.  Electronically Signed: By: Lars Pinks M.D. On: 05/31/2014 16:16    EKG: Independently reviewed.   Assessment/Plan Principal Problem:   Colon tumor Active Problems:   Anemia   Essential hypertension   DIASTOLIC HEART FAILURE, CHRONIC   Pre-operative cardiovascular examination   Colovaginal fistula   Small bowel obstruction     Colon tumor CT findings of 6 cm up with core lesion concerning for colon cancer. Interestingly patient had CT scan recently on 04/28/14 which did not show the same growth. Last colonoscopy patient had was about 9-10 years ago. General surgery consulted, GI consulted, check CEA. Likely patient will need surgery anyway because of the obstructing nature of the growth.  SBO Secondary to the tumor involving the ileocecal valve. Patient will be nothing by mouth, hold on placing NG tube as she does not have a lot of nausea or vomiting. Obtain plain x-rays in a.m.  Chronic diastolic CHF Last 2-D echocardiogram and 2011 showed LVEF of 60% with grade 1 diastolic dysfunction. Patient is on Lasix,  Aldactone and ARB, all being held. Patient placed on IV fluids because of the nothing by mouth status, we'll be careful to avoid fluid overload.  Colovaginal fistula Patient has known colovaginal fistula, recently evaluated by Dr. Marcello Moores of [facility]. Stable, CT scan did not show any acute events.  Anemia Microcytic iron deficiency anemia likely secondary to chronic blood loss with iron of 18 and ferritin of 20. Recent transfusion, average MCV at that time was 69 around a month ago, patient is on iron supplements. This could be likely secondary to the colonic lesion.  Code Status: full code Family Communication: plan discussed with the patient in the presence of her daughter and 2 granddaughters at bedside Disposition Plan: MedSurg, inpatient  Time spent: 39 minutes  [facility] Pager [phone]

## 2014-05-31 NOTE — ED Notes (Signed)
CT aware that pt finished contrast.

## 2014-05-31 NOTE — Consult Note (Signed)
Reason for Consult:SBO with possible mass Referring Physician: Dr. Eliezer Mccoy [name] is an 80 y.o. female.  HPI: this is a pleasant 80 year old female who presents with cramping abdominal pain, nausea, and dry heaves. This is been occurring for the last 5-6 days. She has a history of chronic abdominal pain and lower GI bleeding.  She saw Dr. [name] in our office last week for a suspected colovaginal fistula. She reports she is still passing flatus and having bowel movements. She is currently more comfortable now. She has an extensive past medical history. She was transfused last month for anemia. She was going to be seeing Dr. Henrene Pastor in January for a possible colonoscopy. She has been on antibiotics at home.  Past Medical History  Diagnosis Date  . ANEMIA 06/30/2007  . COLONIC POLYPS, HX OF 01/21/2007  . COLONIC POLYPS, HX OF 01/21/2007  . DEGENERATIVE JOINT DISEASE 05/19/2007  . DEPRESSION 02/07/2010  . DIASTOLIC HEART FAILURE, CHRONIC 02/27/2009  . DIVERTICULOSIS, COLON 01/21/2007  . DYSPNEA 11/13/2009  . Edema 11/13/2009  . GERD 01/21/2007  . HIATAL HERNIA WITH REFLUX 01/21/2007  . HYPERTENSION 01/21/2007  . LIMB PAIN 11/13/2009  . NECK PAIN, CHRONIC 02/13/2010  . Pure hypercholesterolemia 04/05/2008  . PONV (postoperative nausea and vomiting)     Pt had problems with memory after procedure  . Colitis     Hx: of  . Pneumonia   . Bronchitis     Hx: of  . FHx: allergies     Hx;of  . Heart palpitations     Hx: of  . Headache(784.0)   . CORONARY ARTERY DISEASE 04/05/2008    BMS to RCA 10/2007  . TIA (transient ischemic attack)     Past Surgical History  Procedure Laterality Date  . Appendectomy    . Cholecystectomy    . Abdominal hysterectomy    . Lumbar laminectomy    . Total knee arthroplasty      3 left knee replacements, 2 right knee replacements  . Rotator cuff repair    . Cardiac stent    . Neck surgery    . Cataract extraction w/ intraocular lens  implant, bilateral      Hx:  of  . Cardiac catheterization      2009  . Tonsillectomy    . Dilation and curettage of uterus    . Patellectomy Right 09/14/2013    DR Marlou Sa  . Patellectomy Right 09/14/2013    Procedure: PATELLECTOMY;  Surgeon: Meredith Pel, MD;  Location: [facility];  Service: Orthopedics;  Laterality: Right;    Family History  Problem Relation Age of Onset  . Osteoarthritis Mother   . Heart failure Mother     Social History:  reports that she quit smoking about 46 years ago. She has never used smokeless tobacco. She reports that she does not drink alcohol or use illicit drugs.  Allergies: No Known Allergies  Medications: I have reviewed the patient's current medications.  Results for orders placed or performed during the hospital encounter of 05/31/14 (from the past 48 hour(s))  CBC with Differential     Status: Abnormal   Collection Time: 05/31/14 12:04 PM  Result Value Ref Range   WBC 7.5 4.0 - 10.5 K/uL   RBC 4.05 3.87 - 5.11 MIL/uL   Hemoglobin 9.6 (L) 12.0 - 15.0 g/dL   HCT 32.0 (L) 36.0 - 46.0 %   MCV 79.0 78.0 - 100.0 fL   MCH 23.7 (L) 26.0 -  34.0 pg   MCHC 30.0 30.0 - 36.0 g/dL   RDW 23.7 (H) 11.5 - 15.5 %   Platelets 237 150 - 400 K/uL   Neutrophils Relative % 82 (H) 43 - 77 %   Lymphocytes Relative 14 12 - 46 %   Monocytes Relative 4 3 - 12 %   Eosinophils Relative 0 0 - 5 %   Basophils Relative 0 0 - 1 %   Neutro Abs 6.1 1.7 - 7.7 K/uL   Lymphs Abs 1.1 0.7 - 4.0 K/uL   Monocytes Absolute 0.3 0.1 - 1.0 K/uL   Eosinophils Absolute 0.0 0.0 - 0.7 K/uL   Basophils Absolute 0.0 0.0 - 0.1 K/uL   RBC Morphology ELLIPTOCYTES   Comprehensive metabolic panel     Status: Abnormal   Collection Time: 05/31/14 12:04 PM  Result Value Ref Range   Sodium 139 137 - 147 mEq/L   Potassium 4.5 3.7 - 5.3 mEq/L   Chloride 101 96 - 112 mEq/L   CO2 21 19 - 32 mEq/L   Glucose, Bld 173 (H) 70 - 99 mg/dL   BUN 23 6 - 23 mg/dL   Creatinine, Ser 1.02 0.50 - 1.10 mg/dL   Calcium 9.2 8.4 - 10.5  mg/dL   Total Protein 7.5 6.0 - 8.3 g/dL   Albumin 3.4 (L) 3.5 - 5.2 g/dL   AST 29 0 - 37 U/L   ALT 12 0 - 35 U/L   Alkaline Phosphatase 68 39 - 117 U/L   Total Bilirubin 0.2 (L) 0.3 - 1.2 mg/dL   GFR calc non Af Amer 50 (L) >90 mL/min   GFR calc Af Amer 58 (L) >90 mL/min    Comment: (NOTE) The eGFR has been calculated using the CKD EPI equation. This calculation has not been validated in all clinical situations. eGFR's persistently <90 mL/min signify possible Chronic Kidney Disease.    Anion gap 17 (H) 5 - 15  I-stat troponin, ED (only if pt is 78 y.o. or older & pain is above umbilicus) - do not order at [facility]     Status: None   Collection Time: 05/31/14 12:25 PM  Result Value Ref Range   Troponin i, poc 0.00 0.00 - 0.08 ng/mL   Comment 3            Comment: Due to the release kinetics of cTnI, a negative result within the first hours of the onset of symptoms does not rule out myocardial infarction with certainty. If myocardial infarction is still suspected, repeat the test at appropriate intervals.   Urinalysis, Routine w reflex microscopic     Status: Abnormal   Collection Time: 05/31/14  3:34 PM  Result Value Ref Range   Color, Urine ORANGE (A) YELLOW    Comment: BIOCHEMICALS [place] BE AFFECTED BY COLOR   APPearance HAZY (A) CLEAR   Specific Gravity, Urine >1.030 (H) 1.005 - 1.030   pH 5.5 5.0 - 8.0   Glucose, UA NEGATIVE NEGATIVE mg/dL   Hgb urine dipstick NEGATIVE NEGATIVE   Bilirubin Urine NEGATIVE NEGATIVE   Ketones, ur 15 (A) NEGATIVE mg/dL   Protein, ur 30 (A) NEGATIVE mg/dL   Urobilinogen, UA 0.2 0.0 - 1.0 mg/dL   Nitrite NEGATIVE NEGATIVE   Leukocytes, UA NEGATIVE NEGATIVE  Urine microscopic-add on     Status: Abnormal   Collection Time: 05/31/14  3:34 PM  Result Value Ref Range   Squamous Epithelial / LPF RARE RARE   WBC, UA 0-2 <3 WBC/hpf  Bacteria, UA FEW (A) RARE   Casts HYALINE CASTS (A) NEGATIVE   Urine-Other MUCOUS PRESENT     Comment: AMORPHOUS  URATES/PHOSPHATES    Ct Abdomen Pelvis W Contrast  05/31/2014   ADDENDUM REPORT: 05/31/2014 16:27  ADDENDUM: Study discussed by telephone with Dr. Scot Jun On 05/31/2014 at 1625 hrs.   Electronically Signed   By: Lars Pinks M.D.   On: 05/31/2014 16:27   05/31/2014   CLINICAL DATA:  80 year old female with right side abdominal pain, severe in constant for 2 days. Diarrhea. Initial encounter.  EXAM: CT ABDOMEN AND PELVIS WITH CONTRAST  TECHNIQUE: Multidetector CT imaging of the abdomen and pelvis was performed using the standard protocol following bolus administration of intravenous contrast.  CONTRAST:  65m OMNIPAQUE IOHEXOL 300 MG/ML  SOLN  COMPARISON:  04/28/2014 and earlier.  FINDINGS: Negative lung bases. Mild cardiomegaly. No pericardial or pleural effusion.  Stable visualized osseous structures.  Moderate volume of free fluid in the pelvis is new. Decompressed bladder. Negative rectum. Sigmoid diverticulosis re - identified, no definite adjacent mesenteric inflammation.  Decompressed left colon, transverse colon, at hepatic flexure.  Soft tissue mass like thickening at the ileocecal valve. The cecum is largely decompressed, but the small bowel is been diffusely dilated distally, with a gradual transition to more normal caliber proximal small bowel. The mass like soft tissue appears to affect both this cecum and terminal ileum, measuring up to 6 cm in length, by up to 4 cm in thickness. Pericecal lymph nodes are identified and appear stable small distal small bowel mesenteric nodes up to 8 mm short axis.  In addition to free fluid in the pelvis and a small bowel mesentery there is a small volume of fluid along the liver. No liver mass identified. Surgically absent gallbladder as before.  Small volume perisplenic fluid. Spleen, pancreas, adrenal glands, and kidneys are stable. Portal venous system is patent. Major arterial structures in the abdomen and pelvis are patent with aortoiliac calcified  atherosclerosis noted. No free air. Moderate volume gastric paraesophageal or hiatal hernia contains contrast today. This has not significantly changed.  IMPRESSION: Acute small bowel obstruction due to what appears to be a soft tissue mass (apple core lesion) at the ileocecal valve suspicious for intestinal adenocarcinoma. Up to 8 mm pericecal lymph nodes are stable. No liver or distant metastasis identified. Small volume free fluid in the abdomen and pelvis, favor reactive. No free air.  Electronically Signed: By: LLars PinksM.D. On: 05/31/2014 16:16    Review of Systems  All other systems reviewed and are negative.  Blood pressure 117/70, pulse 78, temperature 98.2 F (36.8 C), temperature source Oral, resp. rate 24, height 5' 3" (1.6 m), weight 170 lb (77.111 kg), SpO2 97 %. Physical Exam  Constitutional: She is oriented to person, place, and time. She appears well-developed and well-nourished. No distress.  HENT:  Head: Normocephalic and atraumatic.  Right Ear: External ear normal.  Left Ear: External ear normal.  Nose: Nose normal.  Mouth/Throat: Oropharynx is clear and moist. No oropharyngeal exudate.  Eyes: Conjunctivae are normal. Pupils are equal, round, and reactive to light. Right eye exhibits no discharge. Left eye exhibits no discharge. No scleral icterus.  Neck: Normal range of motion. No tracheal deviation present.  Cardiovascular: Normal rate, regular rhythm and normal heart sounds.   No murmur heard. Respiratory: Effort normal and breath sounds normal. No respiratory distress. She has no wheezes.  GI: Soft. There is tenderness.  There are multiple well-healed  incisions. There is mild diffuse tenderness with minimal guarding and there is mild distention with the greatest tenderness in the lower abdomen  Musculoskeletal: Normal range of motion. She exhibits no edema or tenderness.  Lymphadenopathy:    She has no cervical adenopathy.  Neurological: She is alert and oriented to  person, place, and time.  Skin: Skin is warm. No rash noted. She is not diaphoretic. No erythema.  Psychiatric: Her behavior is normal. Judgment normal.    Assessment/Plan: Small bowel obstruction with possible obstructing mass at the ileocecal valve.  The CAT scan is distinctly different from a scan last month. She is being admitted to the medical service. We will hold on NG tube for now but will place one if her abdominal pain worsens or she develops vomiting. I have recommended that GI be consulted to consider a colonoscopy. Again, this area is different than the suspected colovaginal fistula. I also suspect that she [facility] need an exploratory laparotomy this week. We will repeat her plain abdominal x-rays in the morning. We will follow her closely  BLACKMAN,DOUGLAS A 05/31/2014, 5:22 PM

## 2014-05-31 NOTE — Telephone Encounter (Signed)
Patient Information:  Caller Name: [name]  Phone: [phone]  Patient: [name]  Gender: Female  DOB: December 24, 1929  Age: 80 Years  PCP: Bluford Kaufmann (Family Practice > 71yrs old)  Office Follow Up:  Does the office need to follow up with this patient?: No  Instructions For The Office: N/A   Symptoms  Reason For Call & Symptoms: abdominal pain on left side of abdomen for a year.  Has been having stools thru vagina for that length of time also.  Has been referred to surgeon but appointment is 07/22/2014.   Abdominal pain worse today 11/19, is all over abdomen, patient says severe. Today nausea, vomiting everything up that she takes.  Appears cold, clammy to daughter.  Has felt weak, afraid to take shower or bath.  Only taken few bites of food very little fluids.  Has small amount of red blood in stool. Spoke with Estill Bamberg in office - sending to ER, let daughter decide if she can take her or if she needs 911 to assist.  Reviewed Health History In EMR: Yes  Reviewed Medications In EMR: Yes  Reviewed Allergies In EMR: Yes  Reviewed Surgeries / Procedures: Yes  Date of Onset of Symptoms: 05/31/2014  Guideline(s) Used:  Abdominal Pain - Female  Disposition Per Guideline:   Go to ED Now  Reason For Disposition Reached:   Severe abdominal pain (e.g., excruciating)  Advice Given:  N/A  Patient Will Follow Care Advice:  YES

## 2014-05-31 NOTE — ED Provider Notes (Signed)
CSN: [id number]     Arrival date & time 05/31/14  1155 History   First MD Initiated Contact with Patient 05/31/14 1256     Chief Complaint  Patient presents with  . Abdominal Pain     (Consider location/radiation/quality/duration/timing/severity/associated sxs/prior Treatment) HPI   Patient to the ER with complaints of severe abdominal pain for the past 5-6 days. She has chronic abdominal pain with hx of rectal bleeding requiring blood transfusion for over the past year however recently this has become acutely worse. She has hx of cholecystectomy, appendectomy and hysterectomy. She describes her pain as from her distal sternum down to her suprapubic region. She has not eaten much in the past week and has had dry heaving that has produced white foamy material. She has had some mild episodes of diarrhea and intermittent normal bowel movements. Denies dysuria or obvious rectal bleeding. Denies fevers, CP, back pain, SOB, weakness. Pt tells me she simply wants her pain to be treated than to go home.   Patients vitals signs are normal in triage. Family members that are present say that pt is working with GI to determine the source of her pain and that she had a normal CT abd/pelv last month.  Past Medical History  Diagnosis Date  . ANEMIA 06/30/2007  . COLONIC POLYPS, HX OF 01/21/2007  . COLONIC POLYPS, HX OF 01/21/2007  . DEGENERATIVE JOINT DISEASE 05/19/2007  . DEPRESSION 02/07/2010  . DIASTOLIC HEART FAILURE, CHRONIC 02/27/2009  . DIVERTICULOSIS, COLON 01/21/2007  . DYSPNEA 11/13/2009  . Edema 11/13/2009  . GERD 01/21/2007  . HIATAL HERNIA WITH REFLUX 01/21/2007  . HYPERTENSION 01/21/2007  . LIMB PAIN 11/13/2009  . NECK PAIN, CHRONIC 02/13/2010  . Pure hypercholesterolemia 04/05/2008  . PONV (postoperative nausea and vomiting)     Pt had problems with memory after procedure  . Colitis     Hx: of  . Pneumonia   . Bronchitis     Hx: of  . FHx: allergies     Hx;of  . Heart palpitations     Hx: of  .  Headache(784.0)   . CORONARY ARTERY DISEASE 04/05/2008    BMS to RCA 10/2007  . TIA (transient ischemic attack)    Past Surgical History  Procedure Laterality Date  . Appendectomy    . Cholecystectomy    . Abdominal hysterectomy    . Lumbar laminectomy    . Total knee arthroplasty      3 left knee replacements, 2 right knee replacements  . Rotator cuff repair    . Cardiac stent    . Neck surgery    . Cataract extraction w/ intraocular lens  implant, bilateral      Hx: of  . Cardiac catheterization      2009  . Tonsillectomy    . Dilation and curettage of uterus    . Patellectomy Right 09/14/2013    DR Marlou Sa  . Patellectomy Right 09/14/2013    Procedure: PATELLECTOMY;  Surgeon: Meredith Pel, MD;  Location: [facility];  Service: Orthopedics;  Laterality: Right;   Family History  Problem Relation Age of Onset  . Osteoarthritis Mother   . Heart failure Mother    History  Substance Use Topics  . Smoking status: Former Smoker    Quit date: 07/16/1967  . Smokeless tobacco: Never Used  . Alcohol Use: No   OB History    No data available     Review of Systems  10 Systems reviewed and are negative  for acute change except as noted in the HPI.     Allergies  Review of patient's allergies indicates no known allergies.  Home Medications   Prior to Admission medications   Medication Sig Start Date End Date Taking? Authorizing Provider  amoxicillin-clavulanate (AUGMENTIN) 875-125 MG per tablet Take 1 tablet by mouth 2 (two) times daily. For 30 days 05/19/14  Yes Historical Provider, MD  Ascorbic Acid (VITAMIN C) 500 MG CAPS Take 1 tablet by mouth daily.    Yes Historical Provider, MD  baclofen (LIORESAL) 10 MG tablet Take 10 mg by mouth 3 (three) times daily.   Yes Historical Provider, MD  benazepril (LOTENSIN) 40 MG tablet Take 0.5 tablets (20 mg total) by mouth daily. 09/18/13  Yes Adeline Saralyn Pilar, MD  benzonatate (TESSALON) 100 MG capsule Take 100 mg by mouth 3 (three) times  daily as needed for cough.   Yes Historical Provider, MD  cholecalciferol (VITAMIN D) 1000 UNITS tablet Take 1,000 Units by mouth daily.   Yes Historical Provider, MD  ferrous sulfate 325 (65 FE) MG tablet Take 1 tablet (325 mg total) by mouth 2 (two) times daily with a meal. 12/30/13  Yes Estela Leonie Green, MD  furosemide (LASIX) 20 MG tablet Take 20 mg by mouth daily. 05/02/14  Yes Historical Provider, MD  gabapentin (NEURONTIN) 600 MG tablet Take 600 mg by mouth daily.   Yes Historical Provider, MD  HYDROcodone-acetaminophen (NORCO) 5-325 MG per tablet Take 1-2 tablets by mouth every 6 (six) hours as needed for moderate pain. 04/20/14  Yes Marletta Lor, MD  LORazepam (ATIVAN) 0.5 MG tablet Take 1 tablet (0.5 mg total) by mouth at bedtime. 04/20/14  Yes Marletta Lor, MD  metoprolol (LOPRESSOR) 50 MG tablet Take 1 tablet (50 mg total) by mouth 2 (two) times daily. 09/02/13  Yes Marletta Lor, MD  Multiple Vitamins-Minerals (MULTIVITAMIN WITH MINERALS) tablet Take 1 tablet by mouth daily.   Yes Historical Provider, MD  nitroGLYCERIN (NITROSTAT) 0.4 MG SL tablet Place 0.4 mg under the tongue every 5 (five) minutes as needed for chest pain.    Yes Historical Provider, MD  sertraline (ZOLOFT) 50 MG tablet Take 50 mg by mouth daily.   Yes Historical Provider, MD  simvastatin (ZOCOR) 40 MG tablet Take 1 tablet (40 mg total) by mouth at bedtime. 09/02/13  Yes Marletta Lor, MD  spironolactone (ALDACTONE) 25 MG tablet Take 1 tablet (25 mg total) by mouth daily. 05/03/14  Yes Marletta Lor, MD  traMADol (ULTRAM) 50 MG tablet Take 50 mg by mouth every 8 (eight) hours.    Yes Historical Provider, MD  traZODone (DESYREL) 100 MG tablet Take 0.5 tablets (50 mg total) by mouth at bedtime. 05/03/14  Yes Marletta Lor, MD  triamcinolone cream (KENALOG) 0.1 % Apply 1 application topically 2 (two) times daily as needed (Eczema on legs).   Yes Historical Provider, MD   BP  121/67 mmHg  Pulse 72  Temp(Src) 98.2 F (36.8 C) (Oral)  Resp 15  Ht 5\' 3"  (1.6 m)  Wt 170 lb (77.111 kg)  BMI 30.12 kg/m2  SpO2 99% Physical Exam  Constitutional: She appears well-developed and well-nourished. No distress.  HENT:  Head: Normocephalic and atraumatic.  Eyes: Pupils are equal, round, and reactive to light.  Neck: Normal range of motion. Neck supple.  Cardiovascular: Normal rate and regular rhythm.   Pulmonary/Chest: Effort normal.  Abdominal: Soft.    Neurological: She is alert.  Skin: Skin  is warm and dry.  Nursing note and vitals reviewed.   ED Course  Procedures (including critical care time) Labs Review Labs Reviewed  CBC WITH DIFFERENTIAL - Abnormal; Notable for the following:    Hemoglobin 9.6 (*)    HCT 32.0 (*)    MCH 23.7 (*)    RDW 23.7 (*)    Neutrophils Relative % 82 (*)    All other components within normal limits  COMPREHENSIVE METABOLIC PANEL - Abnormal; Notable for the following:    Glucose, Bld 173 (*)    Albumin 3.4 (*)    Total Bilirubin 0.2 (*)    GFR calc non Af Amer 50 (*)    GFR calc Af Amer 58 (*)    Anion gap 17 (*)    All other components within normal limits  URINALYSIS, ROUTINE W REFLEX MICROSCOPIC  PROTIME-INR  LIPASE, BLOOD  I-STAT TROPOININ, ED    Imaging Review No results found.   EKG Interpretation None      MDM   Final diagnoses:  Abdominal pain   Medications  0.9 %  sodium chloride infusion ( Intravenous New Bag/Given 05/31/14 1333)  ondansetron (ZOFRAN-ODT) disintegrating tablet 8 mg (8 mg Oral Given 05/31/14 1215)  ondansetron (ZOFRAN) injection 4 mg (4 mg Intravenous Given 05/31/14 1333)  morphine 2 MG/ML injection 2 mg (2 mg Intravenous Given 05/31/14 1333)  iohexol (OMNIPAQUE) 300 MG/ML solution 25 mL (25 mLs Oral Contrast Given 05/31/14 1343)   2:27pm I discussed case with Dr. Darl Householder and he agrees with my work-up and plan. If her CT scan is unremarkable and we are able to control her pain then  she can go home as long as clinical course does not change.    3:00 pm Dr. Darl Householder has seen patient as well. He is agreeable with plan. At end of shift patient hand off to [employer].  Filed Vitals:   05/31/14 1447  BP: 116/63  Pulse: 77  Temp:   Resp: [address] PA-C 05/31/14 [place] Yao, MD 05/31/14 [phone]

## 2014-05-31 NOTE — Telephone Encounter (Signed)
FYI

## 2014-05-31 NOTE — ED Provider Notes (Signed)
At change of shift, received hand-off report from Delos Haring, PA-C. Plan includes cath UA, pain mgmt and CT scan.  Pt currently alert and in no acute distress, rates pain 5-6/10.    4:30 PM: CT resulted showing SBO and mass concerning for adenocarcinoma.  Pt and family updated.  Pain currently managed. Gen Surgery paged. Consulted with Dr. Harrington Challenger, will evaluate pt, needs to be admitted to medicine.  4:50 PM: Consulted with Dr. Hartford Poli (hospitalist).  Will admit to med/surg bed.  Pt and family updated. The patient appears reasonably stabilized for admission considering the current resources, flow, and capabilities available in the ED at this time, and I doubt any further screening and/or treatment in the ED prior to admission.   Britt Bottom, NP 06/01/14 [place], MD 06/01/14 [phone]

## 2014-06-01 ENCOUNTER — Encounter (HOSPITAL_COMMUNITY): Payer: Self-pay | Admitting: Physician Assistant

## 2014-06-01 ENCOUNTER — Encounter: Payer: Self-pay | Admitting: Neurology

## 2014-06-01 LAB — BASIC METABOLIC PANEL
Anion gap: 9 (ref 5–15)
BUN: 25 mg/dL — ABNORMAL HIGH (ref 6–23)
CO2: 25 mEq/L (ref 19–32)
Calcium: 8.5 mg/dL (ref 8.4–10.5)
Chloride: 102 mEq/L (ref 96–112)
Creatinine, Ser: 1.42 mg/dL — ABNORMAL HIGH (ref 0.50–1.10)
GFR calc Af Amer: 39 mL/min — ABNORMAL LOW (ref >90)
GFR calc non Af Amer: 33 mL/min — ABNORMAL LOW (ref >90)
Glucose, Bld: 110 mg/dL — ABNORMAL HIGH (ref 70–99)
Potassium: 4.7 mEq/L (ref 3.7–5.3)
Sodium: 136 mEq/L — ABNORMAL LOW (ref 137–147)

## 2014-06-01 LAB — CBC
HCT: 28.8 % — ABNORMAL LOW (ref 36.0–46.0)
Hemoglobin: 8.5 g/dL — ABNORMAL LOW (ref 12.0–15.0)
MCH: 23.1 pg — ABNORMAL LOW (ref 26.0–34.0)
MCHC: 29.5 g/dL — ABNORMAL LOW (ref 30.0–36.0)
MCV: 78.3 fL (ref 78.0–100.0)
Platelets: 223 10*3/uL (ref 150–400)
RBC: 3.68 MIL/uL — ABNORMAL LOW (ref 3.87–5.11)
RDW: 24 % — ABNORMAL HIGH (ref 11.5–15.5)
WBC: 9.3 10*3/uL (ref 4.0–10.5)

## 2014-06-01 LAB — HEMOGLOBIN A1C
Hgb A1c MFr Bld: 5.9 % — ABNORMAL HIGH (ref <5.7)
Mean Plasma Glucose: 123 mg/dL — ABNORMAL HIGH (ref <117)

## 2014-06-01 LAB — CEA: CEA: 1.7 ng/mL (ref 0.0–5.0)

## 2014-06-01 MED ORDER — BOOST / RESOURCE BREEZE PO LIQD: 1.0000 | Freq: Three times a day (TID) | ORAL | Status: DC

## 2014-06-01 MED ORDER — PROMETHAZINE HCL 25 MG/ML IJ SOLN
12.5000 mg | Freq: Four times a day (QID) | INTRAMUSCULAR | Status: DC | PRN
  Administered 2014-06-01 – 2014-06-02 (×2): 12.5 mg via INTRAVENOUS
  Administered 2014-06-02: 6.25 mg via INTRAVENOUS
  Administered 2014-06-03 – 2014-06-05 (×5): 12.5 mg via INTRAVENOUS
  Filled 2014-06-01 (×7): qty 1

## 2014-06-01 MED ORDER — BISACODYL 5 MG PO TBEC
5.0000 mg | DELAYED_RELEASE_TABLET | ORAL | Status: AC
  Administered 2014-06-01 (×2): 5 mg via ORAL
  Filled 2014-06-01 (×2): qty 1

## 2014-06-01 MED ORDER — BISACODYL 10 MG RE SUPP
10.0000 mg | Freq: Once | RECTAL | Status: AC
  Administered 2014-06-01: 10 mg via RECTAL
  Filled 2014-06-01: qty 1

## 2014-06-01 MED ORDER — SODIUM CHLORIDE 0.9 % IV SOLN: Freq: Once | INTRAVENOUS | Status: DC

## 2014-06-01 MED ORDER — BISACODYL 5 MG PO TBEC: 5.0000 mg | DELAYED_RELEASE_TABLET | Freq: Once | ORAL | Status: DC

## 2014-06-01 MED ORDER — DEXTROSE 5 % IV SOLN
2.0000 g | INTRAVENOUS | Status: AC
  Administered 2014-06-02: 2 g via INTRAVENOUS
  Filled 2014-06-01: qty 2

## 2014-06-01 MED ORDER — MORPHINE SULFATE 2 MG/ML IJ SOLN
1.0000 mg | INTRAMUSCULAR | Status: DC | PRN
  Administered 2014-06-01 (×3): 2 mg via INTRAVENOUS
  Administered 2014-06-02: 3 mg via INTRAVENOUS
  Administered 2014-06-02: 2 mg via INTRAVENOUS
  Filled 2014-06-01: qty 1
  Filled 2014-06-01: qty 2
  Filled 2014-06-01 (×5): qty 1

## 2014-06-01 MED ORDER — ONDANSETRON HCL 4 MG/2ML IJ SOLN
4.0000 mg | INTRAMUSCULAR | Status: DC | PRN
  Administered 2014-06-02 – 2014-06-03 (×3): 4 mg via INTRAVENOUS
  Filled 2014-06-01 (×3): qty 2

## 2014-06-01 NOTE — Progress Notes (Signed)
Patient ID: [name], female   DOB: 21-Oct-1929, 80 y.o.   MRN: [id number]    Subjective: Pt c/o abdominal pain today.  Some nausea.    Objective: Vital signs in last 24 hours: Temp:  [98 F (36.7 C)-98.5 F (36.9 C)] 98 F (36.7 C) (11/18 0518) Pulse Rate:  [67-98] 67 (11/18 0518) Resp:  [14-26] 16 (11/18 0518) BP: (102-128)/(46-70) 102/46 mmHg (11/18 0518) SpO2:  [90 %-99 %] 94 % (11/18 0518) Weight:  [170 lb (77.111 kg)] 170 lb (77.111 kg) (11/17 1257) Last BM Date: 06/01/14  Intake/Output from previous day: 11/17 0701 - 11/18 0700 In: 510.8 [I.V.:510.8] Out: -  Intake/Output this shift:    PE: Abd: soft, but mild diffuse tenderness, few BS Heart: regular Lungs: CTAB  Lab Results:   Recent Labs  05/31/14 1204 06/01/14 0525  WBC 7.5 9.3  HGB 9.6* 8.5*  HCT 32.0* 28.8*  PLT 237 223   BMET  Recent Labs  05/31/14 1204 06/01/14 0525  NA 139 136*  K 4.5 4.7  CL 101 102  CO2 21 25  GLUCOSE 173* 110*  BUN 23 25*  CREATININE 1.02 1.42*  CALCIUM 9.2 8.5   PT/INR  Recent Labs  05/31/14 1909  LABPROT 15.2  INR 1.19   CMP     Component Value Date/Time   NA 136* 06/01/2014 0525   K 4.7 06/01/2014 0525   CL 102 06/01/2014 0525   CO2 25 06/01/2014 0525   GLUCOSE 110* 06/01/2014 0525   BUN 25* 06/01/2014 0525   CREATININE 1.42* 06/01/2014 0525   CALCIUM 8.5 06/01/2014 0525   PROT 7.5 05/31/2014 1204   ALBUMIN 3.4* 05/31/2014 1204   AST 29 05/31/2014 1204   ALT 12 05/31/2014 1204   ALKPHOS 68 05/31/2014 1204   BILITOT 0.2* 05/31/2014 1204   GFRNONAA 33* 06/01/2014 0525   GFRAA 39* 06/01/2014 0525   Lipase  No results found for: LIPASE     Studies/Results: Ct Abdomen Pelvis W Contrast  05/31/2014   ADDENDUM REPORT: 05/31/2014 16:27  ADDENDUM: Study discussed by telephone with Dr. Scot Jun On 05/31/2014 at 1625 hrs.   Electronically Signed   By: Lars Pinks M.D.   On: 05/31/2014 16:27   05/31/2014   CLINICAL DATA:  80 year old female  with right side abdominal pain, severe in constant for 2 days. Diarrhea. Initial encounter.  EXAM: CT ABDOMEN AND PELVIS WITH CONTRAST  TECHNIQUE: Multidetector CT imaging of the abdomen and pelvis was performed using the standard protocol following bolus administration of intravenous contrast.  CONTRAST:  70mL OMNIPAQUE IOHEXOL 300 MG/ML  SOLN  COMPARISON:  04/28/2014 and earlier.  FINDINGS: Negative lung bases. Mild cardiomegaly. No pericardial or pleural effusion.  Stable visualized osseous structures.  Moderate volume of free fluid in the pelvis is new. Decompressed bladder. Negative rectum. Sigmoid diverticulosis re - identified, no definite adjacent mesenteric inflammation.  Decompressed left colon, transverse colon, at hepatic flexure.  Soft tissue mass like thickening at the ileocecal valve. The cecum is largely decompressed, but the small bowel is been diffusely dilated distally, with a gradual transition to more normal caliber proximal small bowel. The mass like soft tissue appears to affect both this cecum and terminal ileum, measuring up to 6 cm in length, by up to 4 cm in thickness. Pericecal lymph nodes are identified and appear stable small distal small bowel mesenteric nodes up to 8 mm short axis.  In addition to free fluid in the pelvis and a small bowel  mesentery there is a small volume of fluid along the liver. No liver mass identified. Surgically absent gallbladder as before.  Small volume perisplenic fluid. Spleen, pancreas, adrenal glands, and kidneys are stable. Portal venous system is patent. Major arterial structures in the abdomen and pelvis are patent with aortoiliac calcified atherosclerosis noted. No free air. Moderate volume gastric paraesophageal or hiatal hernia contains contrast today. This has not significantly changed.  IMPRESSION: Acute small bowel obstruction due to what appears to be a soft tissue mass (apple core lesion) at the ileocecal valve suspicious for intestinal  adenocarcinoma. Up to 8 mm pericecal lymph nodes are stable. No liver or distant metastasis identified. Small volume free fluid in the abdomen and pelvis, favor reactive. No free air.  Electronically Signed: By: Lars Pinks M.D. On: 05/31/2014 16:16    Anti-infectives: Anti-infectives    None       Assessment/Plan  1. SBO secondary to mass at ICV  Plan: 1.  CEA normal.  Await GI evaluation for colonoscopy to better evaluate this lesion on her CT scan that was not seen just 1 month ago. 2. She is on clear liquids now, but not really taking any in due to some nausea.  Would be cautious about oral intake given obstruction.  Does not need NGT now, but if develops vomiting, she will need on placed. 3. She will likely require surgical intervention at some point this admit.  LOS: 1 day    Kingslee Dowse E 06/01/2014, 11:25 AM Pager: [phone]

## 2014-06-01 NOTE — Consult Note (Signed)
[facility] Consult: 1:28 PM 06/01/2014  LOS: 1 day    Referring Provider: Dr Hartford Poli and Ninfa Linden  Primary Care Physician:  Nyoka Cowden, MD Primary Gastroenterologist:  Dr. Henrene Pastor     Reason for Consultation:  Mass in colon   HPI: [name] is a 80 y.o. female.  Past history chronic anemia, diastolic CHF. She has had significant problems with degenerative arthritis in her knees and has undergone 3 knee replacements on the left, 2 on the right as well as a patellectomy.  History of diverticulosis and colovaginal fistula.  The fistula has been apparent for about a year with feculent drainage from the vagina which has been progressing.   Patient was last seen at an office visit by Dr. Henrene Pastor in September 2014, referred for microcytic anemia and FOBT positivity.  Patient had colonoscopies in 1999, 2004 and 2007. In 2004 she had a diminutive adenoma. There were no polyps found on the 2007 colonoscopy. She had EGD in 06/2006 which showed large hiatal hernia and deformity in the pylorus.  Dr. Henrene Pastor felt likely that she Bung[place] be losing blood from [facility] erosions within the hiatal hernia or from NSAID (Celebrex) induced lesions as well as possible AVMs. After discussion between Dr. Henrene Pastor and the patient it was decided to forego any colonoscopy or EGD given her age. He recommended continuing daily PPI, iron supplementation and avoiding aspirin/NSAIDs.  She has not been using Celebrex since that time.  She received outpatient transfusion of 2 units red blood cells in early October 2015 when her hemoglobin dropped from 8.3 down to 7.5.  It has been as low as 7.97 several months previously.months previously.  She takes chronic iron. Last week she was seen by Dr. Doreene Adas in the Gen. Surgical office for a  suspected colovaginal fistula.  The referral was made by Dr Inda Merlin to the surgeon.  Dr. Marcello Moores wished for the patient to undergo colonoscopy before pursuing surgery.  She has an appointment set up to date see Dr. Henrene Pastor on 08/01/2014.  In the meantime she was admitted yesterday with acute on chronic abdominal pain abdominal pain.  Pain is been worsening over the past couple of weeks. It began with pain in the left abdomen but has migrated to the lower abdomen bilaterally.  She takes hydrocodone once to twice a day for knee pain. This medication has not helped alleviate the abdominal pain  She's had nausea and dry heaves She has not had dramatic weight loss but does think that over the last few months she has lost maybe 10 pounds. She's had early satiety and anorexia for at least 3 months.  At times she has seen some minor amounts of blood when she wipes her rectum but stools are generally very dark due to her oral iron.  She feels weak and worn out.  She has had a couple of bowel movements today and in fact had never ceased having daily bowel movements.  However she does say that her bowel movements have increased in frequency but diminished in volume over the  past few weeks    CT scan of 11/17 shows small bowel obstruction and apple core appearing soft tissue mass in the region of the ileocecal valve. It is suspicious for adenocarcinoma. Pericecal lymph nodes are stable. There is no evidence for liver or distant metastasis. CEA level is 1.7.  Patient had CT scan on 04/28/2014 but there was no mention of a mass in the region of the ileocecal CEA level is 1.7.Hemoglobin is 9.6 with an MCV of 79.  LFTs are normal as his renal function. Serum glucoses of 110 and 173. However she doesn't carry a diagnosis of diabetes.   Past Medical History  Diagnosis Date  . ANEMIA 06/30/2007  . COLONIC POLYPS, HX OF 01/21/2007  . COLONIC POLYPS, HX OF 01/21/2007  . DEGENERATIVE JOINT DISEASE 05/19/2007  . DEPRESSION  02/07/2010  . DIASTOLIC HEART FAILURE, CHRONIC 02/27/2009  . DIVERTICULOSIS, COLON 01/21/2007  . DYSPNEA 11/13/2009  . Edema 11/13/2009  . GERD 01/21/2007  . HIATAL HERNIA WITH REFLUX 01/21/2007  . HYPERTENSION 01/21/2007  . LIMB PAIN 11/13/2009  . NECK PAIN, CHRONIC 02/13/2010  . Pure hypercholesterolemia 04/05/2008  . PONV (postoperative nausea and vomiting)     Pt had problems with memory after procedure  . Colitis     Hx: of  . Pneumonia   . Bronchitis     Hx: of  . FHx: allergies     Hx;of  . Heart palpitations     Hx: of  . Headache(784.0)   . CORONARY ARTERY DISEASE 04/05/2008    BMS to RCA 10/2007  . TIA (transient ischemic attack)     Past Surgical History  Procedure Laterality Date  . Appendectomy    . Cholecystectomy    . Abdominal hysterectomy    . Lumbar laminectomy    . Total knee arthroplasty      3 left knee replacements, 2 right knee replacements  . Rotator cuff repair    . Neck surgery    . Cataract extraction w/ intraocular lens  implant, bilateral Bilateral   . Tonsillectomy    . Dilation and curettage of uterus    . Patellectomy Right 09/14/2013    DR Marlou Sa  . Patellectomy Right 09/14/2013    Procedure: PATELLECTOMY;  Surgeon: Meredith Pel, MD;  Location: [place];  Service: Orthopedics;  Laterality: Right;  . Joint replacement    . Cardiac catheterization  2009*  . Coronary angioplasty with stent placement  2009    "2 days after 1st cath"    Prior to Admission medications   Medication Sig Start Date End Date Taking? Authorizing Provider  amoxicillin-clavulanate (AUGMENTIN) 875-125 MG per tablet Take 1 tablet by mouth 2 (two) times daily. For 30 days 05/19/14  Yes Historical Provider, MD  Ascorbic Acid (VITAMIN C) 500 MG CAPS Take 1 tablet by mouth daily.    Yes Historical Provider, MD  baclofen (LIORESAL) 10 MG tablet Take 10 mg by mouth 3 (three) times daily.   Yes Historical Provider, MD  benazepril (LOTENSIN) 40 MG tablet Take 0.5 tablets (20 mg total) by  mouth daily. 09/18/13  Yes Adeline Saralyn Pilar, MD  benzonatate (TESSALON) 100 MG capsule Take 100 mg by mouth 3 (three) times daily as needed for cough.   Yes Historical Provider, MD  cholecalciferol (VITAMIN D) 1000 UNITS tablet Take 1,000 Units by mouth daily.   Yes Historical Provider, MD  ferrous sulfate 325 (65 FE) MG tablet Take 1 tablet (325 mg total) by mouth 2 (two)  times daily with a meal. 12/30/13  Yes Estela Leonie Green, MD  furosemide (LASIX) 20 MG tablet Take 20 mg by mouth daily. 05/02/14  Yes Historical Provider, MD  gabapentin (NEURONTIN) 600 MG tablet Take 600 mg by mouth daily.   Yes Historical Provider, MD  HYDROcodone-acetaminophen (NORCO) 5-325 MG per tablet Take 1-2 tablets by mouth every 6 (six) hours as needed for moderate pain. 04/20/14  Yes Marletta Lor, MD  LORazepam (ATIVAN) 0.5 MG tablet Take 1 tablet (0.5 mg total) by mouth at bedtime. 04/20/14  Yes Marletta Lor, MD  metoprolol (LOPRESSOR) 50 MG tablet Take 1 tablet (50 mg total) by mouth 2 (two) times daily. 09/02/13  Yes Marletta Lor, MD  Multiple Vitamins-Minerals (MULTIVITAMIN WITH MINERALS) tablet Take 1 tablet by mouth daily.   Yes Historical Provider, MD  nitroGLYCERIN (NITROSTAT) 0.4 MG SL tablet Place 0.4 mg under the tongue every 5 (five) minutes as needed for chest pain.    Yes Historical Provider, MD  sertraline (ZOLOFT) 50 MG tablet Take 50 mg by mouth daily.   Yes Historical Provider, MD  simvastatin (ZOCOR) 40 MG tablet Take 1 tablet (40 mg total) by mouth at bedtime. 09/02/13  Yes Marletta Lor, MD  spironolactone (ALDACTONE) 25 MG tablet Take 1 tablet (25 mg total) by mouth daily. 05/03/14  Yes Marletta Lor, MD  traMADol (ULTRAM) 50 MG tablet Take 50 mg by mouth every 8 (eight) hours.    Yes Historical Provider, MD  traZODone (DESYREL) 100 MG tablet Take 0.5 tablets (50 mg total) by mouth at bedtime. 05/03/14  Yes Marletta Lor, MD  triamcinolone cream (KENALOG) 0.1  % Apply 1 application topically 2 (two) times daily as needed (Eczema on legs).   Yes Historical Provider, MD    Scheduled Meds: . bisacodyl  5 mg Oral Q4H  . heparin  5,000 Units Subcutaneous 3 times per day  . metoprolol  50 mg Oral BID  . sertraline  50 mg Oral Daily  . sodium chloride  3 mL Intravenous Q12H   Infusions: . dextrose 5 % and 0.45 % NaCl with KCl 10 mEq/L 50 mL/hr at 05/31/14 1947   PRN Meds: acetaminophen **OR** acetaminophen, HYDROcodone-acetaminophen, morphine injection, ondansetron (ZOFRAN) IV, promethazine   Allergies as of 05/31/2014  . (No Known Allergies)    Family History  Problem Relation Age of Onset  . Osteoarthritis Mother   . Heart failure Mother     History   Social History  . Marital Status: Married    Spouse Name: N/A    Number of Children: N/A  . Years of Education: N/A   Occupational History  . Not on file.   Social History Main Topics  . Smoking status: Former Smoker    Quit date: 07/16/1967  . Smokeless tobacco: Never Used  . Alcohol Use: No  . Drug Use: No  . Sexual Activity: Not on file   Other Topics Concern  . Not on file   Social History Narrative   Married, 2 children   Right handed   8th grade   1 cup daily    REVIEW OF SYSTEMS: Constitutional:  Per history of present illness.  She lives independently by herself at home. She does most of her own cooking and cleaning though she does get some help from a granddaughter with some of the heavier household chores ENT:  No nose bleeds Pulm:  No dyspnea, no cough, no orthopnea CV:  No palpitations,  no LE edema. No chest pain GU:  No hematuria, no frequency.  No oliguria, no dysuria GI:  Per history of present illness. Heme:  Per HPI    Transfusions:  Per HPI Neuro:  No headaches, no peripheral tingling or numbness Derm:  No itching, no rash or sores.  MS:  Has a lot of pain in her knee joints. Endocrine:  No sweats or chills.  No polyuria or  dysuria Immunization:  Has received vaccination for flu shot this fall Travel:  None beyond local counties in last few months.    PHYSICAL EXAM: Vital signs in last 24 hours: Filed Vitals:   06/01/14 1224  BP: 104/48  Pulse: 65  Temp: 98.1 F (36.7 C)  Resp: 17   Wt Readings from Last 3 Encounters:  05/31/14 170 lb (77.111 kg)  04/26/14 176 lb (79.833 kg)  04/20/14 176 lb (79.833 kg)    General: tired, comfortable, pleasant, somewhat chronically ill-appearing elderly white female. Head:  No asymmetry, swelling or signs of trauma  Eyes:  No icterus or pallor Ears:  No significant hearing loss  Nose:  No congestion or discharge Mouth:  Clear, moist, good dental repair. Neck:  No thyromegaly, masses, bruits or JVD Lungs:  No labored breathing or cough. Lung sounds are clear Heart: RRR. No MRG rate S1 and S2 audible, it sounds are slightly distant Abdomen:  Nondistended, bowel sounds are hypoactive. There is diffuse, non-focal tenderness throughout the abdomen. No guarding or rebound. No masses no organomegaly.   Rectal: deferred   Musc/Skeltl: postoperative changes in both knees. Nodal arthritic changes in her fingers Extremities:  No lower extremity or pedal edema. Feet are warm.  Neurologic:  A and O 3.  No tremor. No limb weakness. No gross deficits. Patient is an excellent historian. Skin:  No telangiectasia, no significant bruising, no sores, no rash. Palms are pale. Tattoos:  none Nodes:  No cervical adenopathy   Psych:  Pleasant was slightly depressed affect. She is relaxed and the overall appearance is someone who is just very tired  Intake/Output from previous day: 11/17 0701 - 11/18 0700 In: 510.8 [I.V.:510.8] Out: -  Intake/Output this shift: Total I/O In: 120 [P.O.:120] Out: -   LAB RESULTS:  Recent Labs  05/31/14 1204 06/01/14 0525  WBC 7.5 9.3  HGB 9.6* 8.5*  HCT 32.0* 28.8*  PLT 237 223   BMET Lab Results  Component Value Date   NA 136*  06/01/2014   NA 139 05/31/2014   NA 139 04/20/2014   K 4.7 06/01/2014   K 4.5 05/31/2014   K 5.8* 04/20/2014   CL 102 06/01/2014   CL 101 05/31/2014   CL 107 04/20/2014   CO2 25 06/01/2014   CO2 21 05/31/2014   CO2 22 04/20/2014   GLUCOSE 110* 06/01/2014   GLUCOSE 173* 05/31/2014   GLUCOSE 90 04/20/2014   BUN 25* 06/01/2014   BUN 23 05/31/2014   BUN 39* 04/20/2014   CREATININE 1.42* 06/01/2014   CREATININE 1.02 05/31/2014   CREATININE 1.5* 04/20/2014   CALCIUM 8.5 06/01/2014   CALCIUM 9.2 05/31/2014   CALCIUM 9.2 04/20/2014   LFT  Recent Labs  05/31/14 1204  PROT 7.5  ALBUMIN 3.4*  AST 29  ALT 12  ALKPHOS 68  BILITOT 0.2*   PT/INR Lab Results  Component Value Date   INR 1.19 05/31/2014   INR 1.69* 04/08/2011   INR 1.56* 04/07/2011   Hepatitis Panel No results for input(s): HEPBSAG, [place], HEPAIGM,  HEPBIGM in the last 72 hours. C-Diff No components found for: CDIFF Lipase  No results found for: LIPASE  Drugs of Abuse  No results found for: LABOPIA, COCAINSCRNUR, LABBENZ, AMPHETMU, THCU, LABBARB   RADIOLOGY STUDIES: Ct Abdomen Pelvis W Contrast  05/31/2014   ADDENDUM REPORT: 05/31/2014 16:27  ADDENDUM: Study discussed by telephone with Dr. Scot Jun On 05/31/2014 at 1625 hrs.   Electronically Signed   By: Lars Pinks M.D.   On: 05/31/2014 16:27   05/31/2014   CLINICAL DATA:  80 year old female with right side abdominal pain, severe in constant for 2 days. Diarrhea. Initial encounter.  EXAM: CT ABDOMEN AND PELVIS WITH CONTRAST  TECHNIQUE: Multidetector CT imaging of the abdomen and pelvis was performed using the standard protocol following bolus administration of intravenous contrast.  CONTRAST:  84mL OMNIPAQUE IOHEXOL 300 MG/ML  SOLN  COMPARISON:  04/28/2014 and earlier.  FINDINGS: Negative lung bases. Mild cardiomegaly. No pericardial or pleural effusion.  Stable visualized osseous structures.  Moderate volume of free fluid in the pelvis is new. Decompressed  bladder. Negative rectum. Sigmoid diverticulosis re - identified, no definite adjacent mesenteric inflammation.  Decompressed left colon, transverse colon, at hepatic flexure.  Soft tissue mass like thickening at the ileocecal valve. The cecum is largely decompressed, but the small bowel is been diffusely dilated distally, with a gradual transition to more normal caliber proximal small bowel. The mass like soft tissue appears to affect both this cecum and terminal ileum, measuring up to 6 cm in length, by up to 4 cm in thickness. Pericecal lymph nodes are identified and appear stable small distal small bowel mesenteric nodes up to 8 mm short axis.  In addition to free fluid in the pelvis and a small bowel mesentery there is a small volume of fluid along the liver. No liver mass identified. Surgically absent gallbladder as before.  Small volume perisplenic fluid. Spleen, pancreas, adrenal glands, and kidneys are stable. Portal venous system is patent. Major arterial structures in the abdomen and pelvis are patent with aortoiliac calcified atherosclerosis noted. No free air. Moderate volume gastric paraesophageal or hiatal hernia contains contrast today. This has not significantly changed.  IMPRESSION: Acute small bowel obstruction due to what appears to be a soft tissue mass (apple core lesion) at the ileocecal valve suspicious for intestinal adenocarcinoma. Up to 8 mm pericecal lymph nodes are stable. No liver or distant metastasis identified. Small volume free fluid in the abdomen and pelvis, favor reactive. No free air.  Electronically Signed: By: Lars Pinks M.D. On: 05/31/2014 16:16    ENDOSCOPIC STUDIES: Per HPI  IMPRESSION:   *  SBO with CT scan showing possible apple core lesion at the ileocecal valve.  Interestingly a CT scan just 1 month ago makes no mention of any such mass. Patient has a chronic colovaginal fistula. He has been symptomatic for at least a year. Patient has a history of colon  adenomatous polyp in 2004 but had no recurrent polyps on latest colonoscopy in 2007.  *  Chronic anemia, iron deficient. She has been heme positive in the past. Dr. Henrene Pastor felt that this [place] be from [facility] lesions in the known hiatal hernia. EGD as well as colonoscopy at her office visit of 14 months ago.    PLAN:     *  As it is now 1:30 on Wednesday, it is too late to try to prep the patient and pursue colonoscopy today. We'll arrange for colonoscopy sometime in the mid day tomorrow  with a split dose movie prep tonight. Patient is willing to attempt to drink the prep, though with this obstructive picture on the CT scan it's questionable whether she is going to be able to prep.  I will discuss case with Dr. Maurene Capes. It Nob[place] be worthwhile pursuing upper endoscopy at the same time as the colonoscopy given the question of [place] lesions.  Before having her begin prep, will get KUB, if still obstructed, [place] not prep or pursue the colonoscopy.  Set up colonosclopy with possible EGD tentatively for 11/19 at 1215.   Addendum at 3:10 PM. Dr. Olevia Perches went over the 2 recent CT reports with radiology and clearly a mass was seen on the imaging from October. She is also spoken with Dr.Tsuei, who does not want [place] to pursue colonoscopy. I went ahead and canceled the colonoscopy   Azucena Freed  06/01/2014, 1:28 PM  Pager: [phone]  Attending MD note:   I have taken a history,  and reviewed the chart. I agree with the Advanced Practitioner's impression and recommendations. I have particularly looked at the  first CT scan from last month which shows the cecal mass already present, currently, the mass is obstructing. Would be difficult to prep colon for colonoscopy in the setting of obstruction. I have discussed with Dr Georgette Dover and he will go ahead with  surgical resection of the tumor tomorrow.  [facility] Pager # [phone]

## 2014-06-01 NOTE — Progress Notes (Signed)
Patient ID: [name], female   DOB: 1930/03/23, 80 y.o.   MRN: [id number]   I examined the patient and spoke with Dr. Olevia Perches (GI) as well as Radiology.  I reviewed her CT scan from October as well as yesterday's CT scan.  The patient has been feeling unwell for about a year.  The fistula has been evaluated by Dr. [name] and descending colostomy was recommended.  Reviewing her CT from October, there was a mass at the terminal ileum/ ileocecal valve even then.  The mass appears larger now.  She is clinically obstructed.  I feel that there is no benefit to a colonoscopy, as there would be significant difficulty in trying to prep her colon.  This does not appear to be an inflammatory mass as it has been growing for some time.    I recommend right hemicolectomy with primary ileocolic anastomosis as well as descending end colostomy to divert her stool away from the rectovaginal fistula.  I explained this thoroughly to the patient and her daughter.  We will type and cross her for transfusion with surgery tomorrow.  The surgical procedure has been discussed with the patient.  Potential risks, benefits, alternative treatments, and expected outcomes have been explained.  All of the patient's questions at this time have been answered.  The likelihood of reaching the patient's treatment goal is good.  The patient understand the proposed surgical procedure and wishes to proceed.  [name]. Georgette Dover, MD, [facility]  General/ Trauma Surgery  06/01/2014 4:06 PM

## 2014-06-01 NOTE — Progress Notes (Signed)
INITIAL NUTRITION ASSESSMENT  DOCUMENTATION CODES Per approved criteria  -Obesity Unspecified   INTERVENTION: Resource Breeze po TID, each supplement provides 250 kcal and 9 grams of protein RD to follow for nutrition care plan  NUTRITION DIAGNOSIS: Inadequate oral intake related to decreased appetite as evidenced by pt report  Goal: Pt to meet >/= 90% of their estimated nutrition needs   Monitor:  PO & supplemental intake, weight, labs, I/O's  Reason for Assessment: Malnutrition Screening Tool Report  80 y.o. female  Admitting Dx: Colon tumor  ASSESSMENT: 80 year old Female with chronic anemia, chronic diastolic CHF, colovaginal fistula and diverticulosis presented with abdominal pain.   In the ED, CT scan of the abdomen and pelvis showed 6 cm apple core lesion involving the ED was cecal valve concerning for colon cancer; admitted for further workup.  Patient reports a decreased appetite for the past several months; she wasn't really consuming meals; would graze/snack throughout the day (ex fruit, oatmeal); she endorses weight loss, however, unable to quantify amount; drinks Boost/Ensure supplements at home; amenable to [employer] (as she is currently on Clear Liquid); RD to order.  No muscle or subcutaneous fat depletion noticed.  Height: Ht Readings from Last 1 Encounters:  05/31/14 5\' 3"  (1.6 m)    Weight: Wt Readings from Last 1 Encounters:  05/31/14 170 lb (77.111 kg)    Ideal Body Weight: 115 lb  % Ideal Body Weight: 147%  Wt Readings from Last 10 Encounters:  05/31/14 170 lb (77.111 kg)  04/26/14 176 lb (79.833 kg)  04/20/14 176 lb (79.833 kg)  12/21/13 186 lb (84.369 kg)  09/20/13 182 lb 4.8 oz (82.69 kg)  09/08/13 182 lb 4.8 oz (82.691 kg)  08/02/13 186 lb (84.369 kg)  07/07/13 188 lb (85.276 kg)  06/15/13 190 lb (86.183 kg)  03/17/13 183 lb (83.008 kg)    Usual Body Weight: 186 lb -- June 2016  % Usual Body Weight: 91%  BMI:  Body mass  index is 30.12 kg/(m^2).  Estimated Nutritional Needs: Kcal: 1700-1900 Protein: 85-95 gm Fluid: 1.7-1.9 L  Skin: Intact  Diet Order: Diet clear liquid  EDUCATION NEEDS: -No education needs identified at this time   Intake/Output Summary (Last 24 hours) at 06/01/14 1444 Last data filed at 06/01/14 1300  Gross per 24 hour  Intake 630.83 ml  Output      0 ml  Net 630.83 ml    Labs:   Recent Labs Lab 05/31/14 1204 06/01/14 0525  NA 139 136*  K 4.5 4.7  CL 101 102  CO2 21 25  BUN 23 25*  CREATININE 1.02 1.42*  CALCIUM 9.2 8.5  GLUCOSE 173* 110*    Scheduled Meds: . heparin  5,000 Units Subcutaneous 3 times per day  . metoprolol  50 mg Oral BID  . sertraline  50 mg Oral Daily  . sodium chloride  3 mL Intravenous Q12H    Continuous Infusions: . dextrose 5 % and 0.45 % NaCl with KCl 10 mEq/L 50 mL/hr at 05/31/14 1947    Past Medical History  Diagnosis Date  . ANEMIA 06/30/2007  . COLONIC POLYPS, HX OF 01/21/2007  . COLONIC POLYPS, HX OF 01/21/2007  . DEGENERATIVE JOINT DISEASE 05/19/2007  . DEPRESSION 02/07/2010  . DIASTOLIC HEART FAILURE, CHRONIC 02/27/2009  . DIVERTICULOSIS, COLON 01/21/2007  . DYSPNEA 11/13/2009  . Edema 11/13/2009  . GERD 01/21/2007  . HIATAL HERNIA WITH REFLUX 01/21/2007  . HYPERTENSION 01/21/2007  . LIMB PAIN 11/13/2009  . NECK PAIN,  CHRONIC 02/13/2010  . Pure hypercholesterolemia 04/05/2008  . PONV (postoperative nausea and vomiting)     Pt had problems with memory after procedure  . Colitis     Hx: of  . Pneumonia   . Bronchitis     Hx: of  . FHx: allergies     Hx;of  . Heart palpitations     Hx: of  . Headache(784.0)   . CORONARY ARTERY DISEASE 04/05/2008    BMS to RCA 10/2007  . TIA (transient ischemic attack)     Past Surgical History  Procedure Laterality Date  . Appendectomy    . Cholecystectomy    . Abdominal hysterectomy    . Lumbar laminectomy    . Total knee arthroplasty      3 left knee replacements, 2 right knee  replacements  . Rotator cuff repair    . Neck surgery    . Cataract extraction w/ intraocular lens  implant, bilateral Bilateral   . Tonsillectomy    . Dilation and curettage of uterus    . Patellectomy Right 09/14/2013    DR Marlou Sa  . Patellectomy Right 09/14/2013    Procedure: PATELLECTOMY;  Surgeon: Meredith Pel, MD;  Location: [place];  Service: Orthopedics;  Laterality: Right;  . Joint replacement    . Cardiac catheterization  2009*  . Coronary angioplasty with stent placement  2009    "2 days after 1st cath"    Arthur Holms, RD, LDN Pager #: [phone] After-Hours Pager #: [phone]

## 2014-06-01 NOTE — Progress Notes (Signed)
Patient ID: [name]  female  HGD:[id number]    DOB: 1929/08/02    DOA: 05/31/2014  PCP: Nyoka Cowden, MD   Brief history of present illness Patient is a 80 year old female with chronic anemia, chronic diastolic CHF, colovaginal fistula and diverticulosis presented with abdominal pain. She reported she has chronic abdominal pain which was getting worse for the past couple of weeks, nausea, vomiting but no severe weight loss. In the ED, CT scan of the abdomen and pelvis showed 6 cm apple core lesion involving the ED was cecal valve concerning for colon cancer. Patient was admitted for further workup.  Assessment/Plan: Principal Problem:   Colon tumor/ SBO - CT finding of 6 cm apple core lesion concerning for colon cancer - gastroenterology consulted, will need a colonoscopy, general surgery following - Per GI recommendations, placed on clear liquid diet  Active Problems: SBO/small bowel obstruction Secondary to mass involving the ileocecal valve For now continue clear liquid diet as tolerated, GI and surgery following  Chronic diastolic CHF - EF in 9622 1160% with grade 1 diastolic dysfunction, currently on gentle IV fluid hydration, Lasix and Aldactone, are held for now  Colovaginal fistula - known colovaginal fistula, recently evaluated by Dr. Marcello Moores of [facility], stable    Anemia Microcytic iron deficiency anemia likely due to chronic blood loss and colon cancer - Gastroenterology consulted, will need colonoscopy +/- EGD  DVT Prophylaxis:heparin subcutaneous  Code Status:full code  Family Communication:discussed with daughter and granddaughter at bedside  Disposition:  Consultants:  Surgery  Gastroenterology  Procedures:  none  Antibiotics:  none    Subjective: Complaining of abdominal pain and nausea  Objective: Weight change:   Intake/Output Summary (Last 24 hours) at 06/01/14 1359 Last data filed at 06/01/14 1300  Gross per 24 hour  Intake  630.83 ml  Output      0 ml  Net 630.83 ml   Blood pressure 104/48, pulse 65, temperature 98.1 F (36.7 C), temperature source Oral, resp. rate 17, height 5\' 3"  (1.6 m), weight 77.111 kg (170 lb), SpO2 96 %.  Physical Exam: General: Alert and awake, oriented x3, not in any acute distress. CVS: S1-S2 clear, no murmur rubs or gallops Chest: clear to auscultation bilaterally, no wheezing, rales or rhonchi Abdomen: soft tenderness in the bilateral lower quadrants normal bowel sounds  Extremities: no cyanosis, clubbing or edema noted bilaterally Neuro: Cranial nerves II-XII intact, no focal neurological deficits  Lab Results: Basic Metabolic Panel:  Recent Labs Lab 05/31/14 1204 06/01/14 0525  NA 139 136*  K 4.5 4.7  CL 101 102  CO2 21 25  GLUCOSE 173* 110*  BUN 23 25*  CREATININE 1.02 1.42*  CALCIUM 9.2 8.5   Liver Function Tests:  Recent Labs Lab 05/31/14 1204  AST 29  ALT 12  ALKPHOS 68  BILITOT 0.2*  PROT 7.5  ALBUMIN 3.4*   No results for input(s): LIPASE, AMYLASE in the last 168 hours. No results for input(s): AMMONIA in the last 168 hours. CBC:  Recent Labs Lab 05/31/14 1204 06/01/14 0525  WBC 7.5 9.3  NEUTROABS 6.1  --   HGB 9.6* 8.5*  HCT 32.0* 28.8*  MCV 79.0 78.3  PLT 237 223   Cardiac Enzymes: No results for input(s): CKTOTAL, CKMB, CKMBINDEX, TROPONINI in the last 168 hours. BNP: Invalid input(s): POCBNP CBG: No results for input(s): GLUCAP in the last 168 hours.   Micro Results: Recent Results (from the past 240 hour(s))  MRSA PCR Screening  Status: None   Collection Time: 05/31/14  8:56 PM  Result Value Ref Range Status   MRSA by PCR NEGATIVE NEGATIVE Final    Comment:        The GeneXpert MRSA Assay (FDA approved for NASAL specimens only), is one component of a comprehensive MRSA colonization surveillance program. It is not intended to diagnose MRSA infection nor to guide or monitor treatment for MRSA infections.      Studies/Results: Ct Abdomen Pelvis W Contrast  05/31/2014   ADDENDUM REPORT: 05/31/2014 16:27  ADDENDUM: Study discussed by telephone with Dr. Scot Jun On 05/31/2014 at 1625 hrs.   Electronically Signed   By: Lars Pinks M.D.   On: 05/31/2014 16:27   05/31/2014   CLINICAL DATA:  80 year old female with right side abdominal pain, severe in constant for 2 days. Diarrhea. Initial encounter.  EXAM: CT ABDOMEN AND PELVIS WITH CONTRAST  TECHNIQUE: Multidetector CT imaging of the abdomen and pelvis was performed using the standard protocol following bolus administration of intravenous contrast.  CONTRAST:  70mL OMNIPAQUE IOHEXOL 300 MG/ML  SOLN  COMPARISON:  04/28/2014 and earlier.  FINDINGS: Negative lung bases. Mild cardiomegaly. No pericardial or pleural effusion.  Stable visualized osseous structures.  Moderate volume of free fluid in the pelvis is new. Decompressed bladder. Negative rectum. Sigmoid diverticulosis re - identified, no definite adjacent mesenteric inflammation.  Decompressed left colon, transverse colon, at hepatic flexure.  Soft tissue mass like thickening at the ileocecal valve. The cecum is largely decompressed, but the small bowel is been diffusely dilated distally, with a gradual transition to more normal caliber proximal small bowel. The mass like soft tissue appears to affect both this cecum and terminal ileum, measuring up to 6 cm in length, by up to 4 cm in thickness. Pericecal lymph nodes are identified and appear stable small distal small bowel mesenteric nodes up to 8 mm short axis.  In addition to free fluid in the pelvis and a small bowel mesentery there is a small volume of fluid along the liver. No liver mass identified. Surgically absent gallbladder as before.  Small volume perisplenic fluid. Spleen, pancreas, adrenal glands, and kidneys are stable. Portal venous system is patent. Major arterial structures in the abdomen and pelvis are patent with aortoiliac calcified  atherosclerosis noted. No free air. Moderate volume gastric paraesophageal or hiatal hernia contains contrast today. This has not significantly changed.  IMPRESSION: Acute small bowel obstruction due to what appears to be a soft tissue mass (apple core lesion) at the ileocecal valve suspicious for intestinal adenocarcinoma. Up to 8 mm pericecal lymph nodes are stable. No liver or distant metastasis identified. Small volume free fluid in the abdomen and pelvis, favor reactive. No free air.  Electronically Signed: By: Lars Pinks M.D. On: 05/31/2014 16:16    Medications: Scheduled Meds: . heparin  5,000 Units Subcutaneous 3 times per day  . metoprolol  50 mg Oral BID  . sertraline  50 mg Oral Daily  . sodium chloride  3 mL Intravenous Q12H      LOS: 1 day   RAI,RIPUDEEP M.D. [facility] 06/01/2014, 1:59 PM Pager: [phone]  If 7PM-7AM, please contact night-coverage [email or web address] Password [email or web address]

## 2014-06-02 ENCOUNTER — Inpatient Hospital Stay (HOSPITAL_COMMUNITY): Payer: Medicare HMO | Admitting: Anesthesiology

## 2014-06-02 ENCOUNTER — Encounter (HOSPITAL_COMMUNITY)
Admission: EM | Disposition: A | Discharge status: Skilled Nursing Facility | Payer: Self-pay | Source: Home / Self Care | Attending: Internal Medicine

## 2014-06-02 ENCOUNTER — Encounter (HOSPITAL_COMMUNITY): Payer: Self-pay | Admitting: Certified Registered Nurse Anesthetist

## 2014-06-02 ENCOUNTER — Inpatient Hospital Stay (HOSPITAL_COMMUNITY): Payer: Medicare HMO

## 2014-06-02 HISTORY — PX: COLOSTOMY: SHX63

## 2014-06-02 HISTORY — PX: PARTIAL COLECTOMY: SHX5273

## 2014-06-02 LAB — BASIC METABOLIC PANEL
Anion gap: 10 (ref 5–15)
BUN: 23 mg/dL (ref 6–23)
CO2: 25 mEq/L (ref 19–32)
Calcium: 8.5 mg/dL (ref 8.4–10.5)
Chloride: 102 mEq/L (ref 96–112)
Creatinine, Ser: 1.25 mg/dL — ABNORMAL HIGH (ref 0.50–1.10)
GFR calc Af Amer: 45 mL/min — ABNORMAL LOW (ref >90)
GFR calc non Af Amer: 39 mL/min — ABNORMAL LOW (ref >90)
Glucose, Bld: 106 mg/dL — ABNORMAL HIGH (ref 70–99)
Potassium: 4.8 mEq/L (ref 3.7–5.3)
Sodium: 137 mEq/L (ref 137–147)

## 2014-06-02 LAB — CBC
HCT: 29.6 % — ABNORMAL LOW (ref 36.0–46.0)
Hemoglobin: 8.6 g/dL — ABNORMAL LOW (ref 12.0–15.0)
MCH: 22.9 pg — ABNORMAL LOW (ref 26.0–34.0)
MCHC: 29.1 g/dL — ABNORMAL LOW (ref 30.0–36.0)
MCV: 78.7 fL (ref 78.0–100.0)
Platelets: 221 10*3/uL (ref 150–400)
RBC: 3.76 MIL/uL — ABNORMAL LOW (ref 3.87–5.11)
RDW: 23.9 % — ABNORMAL HIGH (ref 11.5–15.5)
WBC: 8.1 10*3/uL (ref 4.0–10.5)

## 2014-06-02 LAB — PREPARE RBC (CROSSMATCH)

## 2014-06-02 SURGERY — Surgical Case
Anesthesia: *Unknown

## 2014-06-02 SURGERY — COLONOSCOPY
Anesthesia: Moderate Sedation

## 2014-06-02 SURGERY — COLECTOMY, PARTIAL
Anesthesia: General | Site: Abdomen

## 2014-06-02 MED ORDER — GLYCOPYRROLATE 0.2 MG/ML IJ SOLN
INTRAMUSCULAR | Status: DC | PRN
  Administered 2014-06-02: 0.4 mg via INTRAVENOUS
  Administered 2014-06-02: 0.1 mg via INTRAVENOUS

## 2014-06-02 MED ORDER — FENTANYL CITRATE 0.05 MG/ML IJ SOLN
25.0000 ug | INTRAMUSCULAR | Status: DC | PRN
  Administered 2014-06-02: 25 ug via INTRAVENOUS
  Administered 2014-06-02: 50 ug via INTRAVENOUS
  Administered 2014-06-02: 25 ug via INTRAVENOUS

## 2014-06-02 MED ORDER — DEXAMETHASONE SODIUM PHOSPHATE 4 MG/ML IJ SOLN
INTRAMUSCULAR | Status: DC | PRN
  Administered 2014-06-02 (×2): 2 mg via INTRAVENOUS

## 2014-06-02 MED ORDER — DEXTROSE 5 % IV SOLN
1.0000 g | Freq: Two times a day (BID) | INTRAVENOUS | Status: DC
  Administered 2014-06-02 – 2014-06-05 (×7): 1 g via INTRAVENOUS
  Filled 2014-06-02 (×9): qty 1

## 2014-06-02 MED ORDER — ACETAMINOPHEN 10 MG/ML IV SOLN
1000.0000 mg | Freq: Once | INTRAVENOUS | Status: AC
Start: 2014-06-02 — End: 2014-06-02
  Administered 2014-06-02: 1000 mg via INTRAVENOUS

## 2014-06-02 MED ORDER — PROPOFOL 10 MG/ML IV BOLUS
INTRAVENOUS | Status: DC | PRN
  Administered 2014-06-02: 100 mg via INTRAVENOUS

## 2014-06-02 MED ORDER — HYDROMORPHONE HCL 1 MG/ML IJ SOLN
INTRAMUSCULAR | Status: AC
  Filled 2014-06-02: qty 1

## 2014-06-02 MED ORDER — ACETAMINOPHEN 10 MG/ML IV SOLN
INTRAVENOUS | Status: AC
  Filled 2014-06-02: qty 100

## 2014-06-02 MED ORDER — 0.9 % SODIUM CHLORIDE (POUR BTL) OPTIME
TOPICAL | Status: DC | PRN
  Administered 2014-06-02 (×3): 1000 mL

## 2014-06-02 MED ORDER — FENTANYL CITRATE 0.05 MG/ML IJ SOLN
INTRAMUSCULAR | Status: DC | PRN
  Administered 2014-06-02 (×2): 25 ug via INTRAVENOUS
  Administered 2014-06-02 (×2): 50 ug via INTRAVENOUS
  Administered 2014-06-02: 100 ug via INTRAVENOUS

## 2014-06-02 MED ORDER — HEPARIN SODIUM (PORCINE) 5000 UNIT/ML IJ SOLN
5000.0000 [IU] | Freq: Three times a day (TID) | INTRAMUSCULAR | Status: DC
  Administered 2014-06-03 – 2014-06-10 (×22): 5000 [IU] via SUBCUTANEOUS
  Filled 2014-06-02 (×31): qty 1

## 2014-06-02 MED ORDER — MORPHINE SULFATE 2 MG/ML IJ SOLN
2.0000 mg | INTRAMUSCULAR | Status: DC | PRN
  Administered 2014-06-02 – 2014-06-03 (×4): 2 mg via INTRAVENOUS
  Administered 2014-06-03 (×3): 4 mg via INTRAVENOUS
  Filled 2014-06-02 (×2): qty 2
  Filled 2014-06-02: qty 1
  Filled 2014-06-02: qty 2
  Filled 2014-06-02 (×3): qty 1

## 2014-06-02 MED ORDER — FENTANYL CITRATE 0.05 MG/ML IJ SOLN
INTRAMUSCULAR | Status: AC
  Filled 2014-06-02: qty 2

## 2014-06-02 MED ORDER — LORAZEPAM 0.5 MG PO TABS
0.5000 mg | ORAL_TABLET | Freq: Once | ORAL | Status: AC
  Administered 2014-06-02: 0.5 mg via ORAL
  Filled 2014-06-02: qty 1

## 2014-06-02 MED ORDER — ONDANSETRON HCL 4 MG/2ML IJ SOLN
INTRAMUSCULAR | Status: DC | PRN
  Administered 2014-06-02 (×2): 4 mg via INTRAVENOUS

## 2014-06-02 MED ORDER — SCOPOLAMINE 1 MG/3DAYS TD PT72
MEDICATED_PATCH | TRANSDERMAL | Status: DC | PRN
  Administered 2014-06-02: 1 via TRANSDERMAL

## 2014-06-02 MED ORDER — SUCCINYLCHOLINE CHLORIDE 20 MG/ML IJ SOLN
INTRAMUSCULAR | Status: DC | PRN
  Administered 2014-06-02: 100 mg via INTRAVENOUS

## 2014-06-02 MED ORDER — MEPERIDINE HCL 25 MG/ML IJ SOLN: 6.2500 mg | INTRAMUSCULAR | Status: DC | PRN

## 2014-06-02 MED ORDER — HYDROMORPHONE HCL 1 MG/ML IJ SOLN
0.5000 mg | Freq: Once | INTRAMUSCULAR | Status: AC
  Administered 2014-06-02: 0.5 mg via INTRAVENOUS

## 2014-06-02 MED ORDER — LACTATED RINGERS IV SOLN
INTRAVENOUS | Status: DC
  Administered 2014-06-02: 10:00:00 via INTRAVENOUS

## 2014-06-02 MED ORDER — LIDOCAINE HCL (CARDIAC) 20 MG/ML IV SOLN
INTRAVENOUS | Status: DC | PRN
  Administered 2014-06-02: 60 mg via INTRATRACHEAL
  Administered 2014-06-02: 100 mg via INTRAVENOUS

## 2014-06-02 MED ORDER — KCL IN DEXTROSE-NACL 10-5-0.45 MEQ/L-%-% IV SOLN
INTRAVENOUS | Status: DC
  Administered 2014-06-02 – 2014-06-06 (×5): via INTRAVENOUS
  Filled 2014-06-02 (×11): qty 1000

## 2014-06-02 MED ORDER — SODIUM CHLORIDE 0.9 % IV SOLN
INTRAVENOUS | Status: DC | PRN
  Administered 2014-06-02 (×2): via INTRAVENOUS

## 2014-06-02 MED ORDER — DIPHENHYDRAMINE HCL 50 MG PO CAPS
50.0000 mg | ORAL_CAPSULE | Freq: Once | ORAL | Status: AC
  Administered 2014-06-02: 50 mg via ORAL
  Filled 2014-06-02: qty 2
  Filled 2014-06-02: qty 1

## 2014-06-02 MED ORDER — NEOSTIGMINE METHYLSULFATE 10 MG/10ML IV SOLN
INTRAVENOUS | Status: DC | PRN
  Administered 2014-06-02: 3 mg via INTRAVENOUS

## 2014-06-02 MED ORDER — MIDAZOLAM HCL 2 MG/2ML IJ SOLN
INTRAMUSCULAR | Status: AC
  Administered 2014-06-02: 0.5 mg
  Filled 2014-06-02: qty 2

## 2014-06-02 MED ORDER — FENTANYL CITRATE 0.05 MG/ML IJ SOLN
INTRAMUSCULAR | Status: AC
  Filled 2014-06-02: qty 5

## 2014-06-02 MED ORDER — HYDROMORPHONE HCL 1 MG/ML IJ SOLN
0.5000 mg | Freq: Once | INTRAMUSCULAR | Status: AC
Start: 2014-06-02 — End: 2014-06-02
  Administered 2014-06-02: 0.5 mg via INTRAVENOUS

## 2014-06-02 MED ORDER — ROCURONIUM BROMIDE 100 MG/10ML IV SOLN
INTRAVENOUS | Status: DC | PRN
  Administered 2014-06-02: 30 mg via INTRAVENOUS

## 2014-06-02 MED ORDER — MIDAZOLAM HCL 2 MG/2ML IJ SOLN
0.5000 mg | Freq: Once | INTRAMUSCULAR | Status: AC
  Administered 2014-06-02: 0.5 mg via INTRAVENOUS

## 2014-06-02 MED ORDER — SCOPOLAMINE 1 MG/3DAYS TD PT72
MEDICATED_PATCH | TRANSDERMAL | Status: AC
  Filled 2014-06-02: qty 1

## 2014-06-02 MED ORDER — PROMETHAZINE HCL 25 MG/ML IJ SOLN
INTRAMUSCULAR | Status: AC
  Administered 2014-06-02: 6.25 mg
  Filled 2014-06-02: qty 1

## 2014-06-02 MED ORDER — HYDROMORPHONE HCL 1 MG/ML IJ SOLN
0.5000 mg | INTRAMUSCULAR | Status: AC | PRN
  Administered 2014-06-02: 0.5 mg via INTRAVENOUS

## 2014-06-02 SURGICAL SUPPLY — 61 items
BLADE SURG ROTATE 9660 (MISCELLANEOUS) IMPLANT
CANISTER SUCTION 2500CC (MISCELLANEOUS) ×2 IMPLANT
CHLORAPREP W/TINT 26ML (MISCELLANEOUS) ×2 IMPLANT
COVER MAYO STAND STRL (DRAPES) ×4 IMPLANT
COVER SURGICAL LIGHT HANDLE (MISCELLANEOUS) ×2 IMPLANT
DRAPE LAPAROSCOPIC ABDOMINAL (DRAPES) ×2 IMPLANT
DRAPE PROXIMA HALF (DRAPES) ×4 IMPLANT
DRAPE UTILITY 15X26 W/TAPE STR (DRAPE) ×10 IMPLANT
DRAPE WARM FLUID 44X44 (DRAPE) ×2 IMPLANT
DRSG OPSITE POSTOP 4X10 (GAUZE/BANDAGES/DRESSINGS) ×2 IMPLANT
DRSG OPSITE POSTOP 4X8 (GAUZE/BANDAGES/DRESSINGS) IMPLANT
ELECT BLADE 6.5 EXT (BLADE) ×4 IMPLANT
ELECT CAUTERY BLADE 6.4 (BLADE) ×4 IMPLANT
ELECT REM PT RETURN 9FT ADLT (ELECTROSURGICAL) ×2
ELECTRODE REM PT RTRN 9FT ADLT (ELECTROSURGICAL) ×1 IMPLANT
GLOVE BIO SURGEON STRL SZ 6.5 (GLOVE) ×2 IMPLANT
GLOVE BIO SURGEON STRL SZ7 (GLOVE) ×6 IMPLANT
GLOVE BIO SURGEON STRL SZ8 (GLOVE) ×2 IMPLANT
GLOVE BIOGEL PI IND STRL 6.5 (GLOVE) ×3 IMPLANT
GLOVE BIOGEL PI IND STRL 7.0 (GLOVE) ×1 IMPLANT
GLOVE BIOGEL PI IND STRL 7.5 (GLOVE) ×2 IMPLANT
GLOVE BIOGEL PI IND STRL 8 (GLOVE) ×1 IMPLANT
GLOVE BIOGEL PI INDICATOR 6.5 (GLOVE) ×3
GLOVE BIOGEL PI INDICATOR 7.0 (GLOVE) ×1
GLOVE BIOGEL PI INDICATOR 7.5 (GLOVE) ×2
GLOVE BIOGEL PI INDICATOR 8 (GLOVE) ×1
GOWN STRL REUS W/ TWL LRG LVL3 (GOWN DISPOSABLE) ×6 IMPLANT
GOWN STRL REUS W/ TWL XL LVL3 (GOWN DISPOSABLE) ×1 IMPLANT
GOWN STRL REUS W/TWL LRG LVL3 (GOWN DISPOSABLE) ×6
GOWN STRL REUS W/TWL XL LVL3 (GOWN DISPOSABLE) ×1
KIT BASIN OR (CUSTOM PROCEDURE TRAY) ×2 IMPLANT
KIT OSTOMY DRAINABLE 2.75 STR (WOUND CARE) ×2 IMPLANT
KIT ROOM TURNOVER OR (KITS) ×2 IMPLANT
LEGGING LITHOTOMY PAIR STRL (DRAPES) IMPLANT
LIGASURE IMPACT 36 18CM CVD LR (INSTRUMENTS) ×2 IMPLANT
NS IRRIG 1000ML POUR BTL (IV SOLUTION) ×6 IMPLANT
PACK GENERAL/GYN (CUSTOM PROCEDURE TRAY) ×2 IMPLANT
PAD ARMBOARD 7.5X6 YLW CONV (MISCELLANEOUS) ×4 IMPLANT
PENCIL BUTTON HOLSTER BLD 10FT (ELECTRODE) ×2 IMPLANT
RELOAD PROXIMATE 75MM BLUE (ENDOMECHANICALS) ×6 IMPLANT
SPONGE LAP 18X18 X RAY DECT (DISPOSABLE) ×2 IMPLANT
STAPLER GUN LINEAR PROX 60 (STAPLE) ×2 IMPLANT
STAPLER PROXIMATE 75MM BLUE (STAPLE) ×2 IMPLANT
STAPLER VISISTAT 35W (STAPLE) ×2 IMPLANT
SUCTION POOLE TIP (SUCTIONS) ×2 IMPLANT
SURGILUBE 2OZ TUBE FLIPTOP (MISCELLANEOUS) IMPLANT
SUT PDS AB 1 TP1 96 (SUTURE) ×4 IMPLANT
SUT PROLENE 2 0 CT2 30 (SUTURE) ×2 IMPLANT
SUT PROLENE 2 0 KS (SUTURE) IMPLANT
SUT SILK 2 0 SH CR/8 (SUTURE) ×2 IMPLANT
SUT SILK 2 0 TIES 10X30 (SUTURE) ×2 IMPLANT
SUT SILK 3 0 SH CR/8 (SUTURE) ×4 IMPLANT
SUT SILK 3 0 TIES 10X30 (SUTURE) ×2 IMPLANT
SUT VIC AB 3-0 SH 18 (SUTURE) ×2 IMPLANT
SYR BULB IRRIGATION 50ML (SYRINGE) ×2 IMPLANT
TOWEL OR 17X26 10 PK STRL BLUE (TOWEL DISPOSABLE) ×4 IMPLANT
TRAY FOLEY CATH 16FRSI W/METER (SET/KITS/TRAYS/PACK) ×2 IMPLANT
TRAY PROCTOSCOPIC FIBER OPTIC (SET/KITS/TRAYS/PACK) IMPLANT
TUBE CONNECTING 12X1/4 (SUCTIONS) ×2 IMPLANT
UNDERPAD 30X30 INCONTINENT (UNDERPADS AND DIAPERS) IMPLANT
YANKAUER SUCT BULB TIP NO VENT (SUCTIONS) ×2 IMPLANT

## 2014-06-02 NOTE — Progress Notes (Signed)
Dr Carrie Mew here to see pt ok given to give pt phenergan for nausea

## 2014-06-02 NOTE — Progress Notes (Addendum)
Subjective: Small Bm yesterday Still distended  Objective: Vital signs in last 24 hours: Temp:  [98.1 F (36.7 C)] 98.1 F (36.7 C) (11/19 0828) Pulse Rate:  [65-78] 78 (11/19 0828) Resp:  [16-22] 17 (11/19 0828) BP: (104-131)/(48-62) 131/56 mmHg (11/19 0828) SpO2:  [96 %-100 %] 96 % (11/19 0828) Last BM Date: 06/01/14  Intake/Output from previous day: 11/18 0701 - 11/19 0700 In: 520 [P.O.:120; I.V.:400] Out: -  Intake/Output this shift:    General appearance: alert, cooperative and no distress GI: distended, minimally tender RLQ  Lab Results:   Recent Labs  06/01/14 0525 06/02/14 0048  WBC 9.3 8.1  HGB 8.5* 8.6*  HCT 28.8* 29.6*  PLT 223 221   BMET  Recent Labs  06/01/14 0525 06/02/14 0048  NA 136* 137  K 4.7 4.8  CL 102 102  CO2 25 25  GLUCOSE 110* 106*  BUN 25* 23  CREATININE 1.42* 1.25*  CALCIUM 8.5 8.5   PT/INR  Recent Labs  05/31/14 1909  LABPROT 15.2  INR 1.19   ABG No results for input(s): PHART, HCO3 in the last 72 hours.  Invalid input(s): PCO2, PO2  Studies/Results: Ct Abdomen Pelvis W Contrast  05/31/2014   ADDENDUM REPORT: 05/31/2014 16:27  ADDENDUM: Study discussed by telephone with Dr. Scot Jun On 05/31/2014 at 1625 hrs.   Electronically Signed   By: Lars Pinks M.D.   On: 05/31/2014 16:27   05/31/2014   CLINICAL DATA:  80 year old female with right side abdominal pain, severe in constant for 2 days. Diarrhea. Initial encounter.  EXAM: CT ABDOMEN AND PELVIS WITH CONTRAST  TECHNIQUE: Multidetector CT imaging of the abdomen and pelvis was performed using the standard protocol following bolus administration of intravenous contrast.  CONTRAST:  8mL OMNIPAQUE IOHEXOL 300 MG/ML  SOLN  COMPARISON:  04/28/2014 and earlier.  FINDINGS: Negative lung bases. Mild cardiomegaly. No pericardial or pleural effusion.  Stable visualized osseous structures.  Moderate volume of free fluid in the pelvis is new. Decompressed bladder. Negative  rectum. Sigmoid diverticulosis re - identified, no definite adjacent mesenteric inflammation.  Decompressed left colon, transverse colon, at hepatic flexure.  Soft tissue mass like thickening at the ileocecal valve. The cecum is largely decompressed, but the small bowel is been diffusely dilated distally, with a gradual transition to more normal caliber proximal small bowel. The mass like soft tissue appears to affect both this cecum and terminal ileum, measuring up to 6 cm in length, by up to 4 cm in thickness. Pericecal lymph nodes are identified and appear stable small distal small bowel mesenteric nodes up to 8 mm short axis.  In addition to free fluid in the pelvis and a small bowel mesentery there is a small volume of fluid along the liver. No liver mass identified. Surgically absent gallbladder as before.  Small volume perisplenic fluid. Spleen, pancreas, adrenal glands, and kidneys are stable. Portal venous system is patent. Major arterial structures in the abdomen and pelvis are patent with aortoiliac calcified atherosclerosis noted. No free air. Moderate volume gastric paraesophageal or hiatal hernia contains contrast today. This has not significantly changed.  IMPRESSION: Acute small bowel obstruction due to what appears to be a soft tissue mass (apple core lesion) at the ileocecal valve suspicious for intestinal adenocarcinoma. Up to 8 mm pericecal lymph nodes are stable. No liver or distant metastasis identified. Small volume free fluid in the abdomen and pelvis, favor reactive. No free air.  Electronically Signed: By: Lars Pinks M.D. On: 05/31/2014 16:16  Anti-infectives: Anti-infectives    Start     Dose/Rate Route Frequency Ordered Stop   06/02/14 0600  cefoTEtan (CEFOTAN) 2 g in dextrose 5 % 50 mL IVPB     2 g100 mL/hr over 30 Minutes Intravenous On call to O.R. 06/01/14 1519 06/03/14 0559      Assessment/Plan: Plan exploratory laparotomy, right hemicolectomy with anastomosis, and  diverting descending end colostomy.  Will likely need transfusion in the operating room.  Unable to use Entereg because of obstruction.   Will transfer to SDU or ICU post-op  Discussed with family.   LOS: 2 days    Thailan Sava K. 06/02/2014

## 2014-06-02 NOTE — Progress Notes (Signed)
Dr Tresa Moore here to see pt. Aware of cont c/o of pain. New orders for IV Ofirmev & Versed. Will cont to monitor closely.

## 2014-06-02 NOTE — Anesthesia Postprocedure Evaluation (Signed)
  Anesthesia Post-op Note  Patient: [name]  Procedure(s) Performed: Procedure(s): RIGHT PARTIAL COLECTOMY  (N/A) DIVERTING DESCENDING END COLOSTOMY (N/A)  Patient Location: PACU  Anesthesia Type:General  Level of Consciousness: awake  Airway and Oxygen Therapy: Patient Spontanous Breathing and Patient connected to nasal cannula oxygen  Post-op Pain: mild  Post-op Assessment: Post-op Vital signs reviewed, Patient's Cardiovascular Status Stable, Respiratory Function Stable and Patent Airway  Post-op Vital Signs: Reviewed  Last Vitals:  Filed Vitals:   06/02/14 1322  BP: 147/91  Pulse: 64  Temp:   Resp: 16    Complications: No apparent anesthesia complications

## 2014-06-02 NOTE — Addendum Note (Signed)
Addendum  created 06/02/14 1435 by Alexis Frock, MD   Modules edited: Orders

## 2014-06-02 NOTE — Transfer of Care (Signed)
Immediate Anesthesia Transfer of Care Note  Patient: [name]  Procedure(s) Performed: Procedure(s): RIGHT PARTIAL COLECTOMY  (N/A) DIVERTING DESCENDING END COLOSTOMY (N/A)  Patient Location: PACU  Anesthesia Type:General  Level of Consciousness: awake and alert   Airway & Oxygen Therapy: Patient Spontanous Breathing and Patient connected to nasal cannula oxygen  Post-op Assessment: Report given to PACU RN and Post -op Vital signs reviewed and stable  Post vital signs: Reviewed and stable  Complications: No apparent anesthesia complications

## 2014-06-02 NOTE — Progress Notes (Signed)
Patient ID: [name]  female  BSW:[id number]    DOB: [birth year]    DOA: 05/31/2014  PCP: Nyoka Cowden, MD   Brief history of present illness Patient is a 80 year old female with chronic anemia, chronic diastolic CHF, colovaginal fistula and diverticulosis presented with abdominal pain. She reported she has chronic abdominal pain which was getting worse for the past couple of weeks, nausea, vomiting but no severe weight loss. In the ED, CT scan of the abdomen and pelvis showed 6 cm apple core lesion involving the ED was cecal valve concerning for colon cancer. Patient was admitted for further workup.  Assessment/Plan: Principal Problem:   Colon tumor/ SBO - CT finding of 6 cm apple core lesion concerning for colon cancer - gastroenterology was consulted, patient was seen by Dr. Olevia Perches, general surgery following, per per surgery, no benefit to a colonoscopy as there would be significant difficulty in trying to prep her colon - patient to undergo right hemicolectomy with primary mucolytic anastomosis and colostomy today   Active Problems: Chronic diastolic CHF - EF in 4665 1160% with grade 1 diastolic dysfunction, currently on gentle IV fluid hydration, Lasix and Aldactone, are held for now  Colovaginal fistula - known colovaginal fistula, recently evaluated by Dr. Marcello Moores of [facility], stable    Anemia Microcytic iron deficiency anemia likely due to chronic blood loss and colon cancer GI following  DVT Prophylaxis:heparin subcutaneous  Code Status:full code  Family Communication:discussed with daughter and granddaughter at bedside  Disposition:  Consultants:  Surgery  Gastroenterology  Procedures:  none  Antibiotics:  none    Subjective: Complaining of abdominal pain, awaiting surgery today at the time of my examination  Objective: Weight change:   Intake/Output Summary (Last 24 hours) at 06/02/14 1238 Last data filed at 06/02/14 1215  Gross per 24  hour  Intake   1255 ml  Output    350 ml  Net    905 ml   Blood pressure 117/53, pulse 75, temperature 98.1 F (36.7 C), temperature source Oral, resp. rate 17, height 5\' 3"  (1.6 m), weight 77.111 kg (170 lb), SpO2 95 %.  Physical Exam: General: Alert and awake, oriented x3, not in any acute distress. CVS: S1-S2 clear Chest: CTAB Abdomen: soft tenderness in the bilateral lower quadrants normal bowel sounds  Extremities: no c/c/e bilaterally   Lab Results: Basic Metabolic Panel:  Recent Labs Lab 06/01/14 0525 06/02/14 0048  NA 136* 137  K 4.7 4.8  CL 102 102  CO2 25 25  GLUCOSE 110* 106*  BUN 25* 23  CREATININE 1.42* 1.25*  CALCIUM 8.5 8.5   Liver Function Tests:  Recent Labs Lab 05/31/14 1204  AST 29  ALT 12  ALKPHOS 68  BILITOT 0.2*  PROT 7.5  ALBUMIN 3.4*   No results for input(s): LIPASE, AMYLASE in the last 168 hours. No results for input(s): AMMONIA in the last 168 hours. CBC:  Recent Labs Lab 05/31/14 1204 06/01/14 0525 06/02/14 0048  WBC 7.5 9.3 8.1  NEUTROABS 6.1  --   --   HGB 9.6* 8.5* 8.6*  HCT 32.0* 28.8* 29.6*  MCV 79.0 78.3 78.7  PLT 237 223 221   Cardiac Enzymes: No results for input(s): CKTOTAL, CKMB, CKMBINDEX, TROPONINI in the last 168 hours. BNP: Invalid input(s): POCBNP CBG: No results for input(s): GLUCAP in the last 168 hours.   Micro Results: Recent Results (from the past 240 hour(s))  MRSA PCR Screening     Status: None  Collection Time: 05/31/14  8:56 PM  Result Value Ref Range Status   MRSA by PCR NEGATIVE NEGATIVE Final    Comment:        The GeneXpert MRSA Assay (FDA approved for NASAL specimens only), is one component of a comprehensive MRSA colonization surveillance program. It is not intended to diagnose MRSA infection nor to guide or monitor treatment for MRSA infections.     Studies/Results: Ct Abdomen Pelvis W Contrast  05/31/2014   ADDENDUM REPORT: 05/31/2014 16:27  ADDENDUM: Study discussed  by telephone with Dr. Scot Jun On 05/31/2014 at 1625 hrs.   Electronically Signed   By: Lars Pinks M.D.   On: 05/31/2014 16:27   05/31/2014   CLINICAL DATA:  80 year old female with right side abdominal pain, severe in constant for 2 days. Diarrhea. Initial encounter.  EXAM: CT ABDOMEN AND PELVIS WITH CONTRAST  TECHNIQUE: Multidetector CT imaging of the abdomen and pelvis was performed using the standard protocol following bolus administration of intravenous contrast.  CONTRAST:  58mL OMNIPAQUE IOHEXOL 300 MG/ML  SOLN  COMPARISON:  04/28/2014 and earlier.  FINDINGS: Negative lung bases. Mild cardiomegaly. No pericardial or pleural effusion.  Stable visualized osseous structures.  Moderate volume of free fluid in the pelvis is new. Decompressed bladder. Negative rectum. Sigmoid diverticulosis re - identified, no definite adjacent mesenteric inflammation.  Decompressed left colon, transverse colon, at hepatic flexure.  Soft tissue mass like thickening at the ileocecal valve. The cecum is largely decompressed, but the small bowel is been diffusely dilated distally, with a gradual transition to more normal caliber proximal small bowel. The mass like soft tissue appears to affect both this cecum and terminal ileum, measuring up to 6 cm in length, by up to 4 cm in thickness. Pericecal lymph nodes are identified and appear stable small distal small bowel mesenteric nodes up to 8 mm short axis.  In addition to free fluid in the pelvis and a small bowel mesentery there is a small volume of fluid along the liver. No liver mass identified. Surgically absent gallbladder as before.  Small volume perisplenic fluid. Spleen, pancreas, adrenal glands, and kidneys are stable. Portal venous system is patent. Major arterial structures in the abdomen and pelvis are patent with aortoiliac calcified atherosclerosis noted. No free air. Moderate volume gastric paraesophageal or hiatal hernia contains contrast today. This has not  significantly changed.  IMPRESSION: Acute small bowel obstruction due to what appears to be a soft tissue mass (apple core lesion) at the ileocecal valve suspicious for intestinal adenocarcinoma. Up to 8 mm pericecal lymph nodes are stable. No liver or distant metastasis identified. Small volume free fluid in the abdomen and pelvis, favor reactive. No free air.  Electronically Signed: By: Lars Pinks M.D. On: 05/31/2014 16:16   Dg Abd 2 Views  06/02/2014   CLINICAL DATA:  Upper abdominal pain and constipation. Small bowel obstruction.  EXAM: ABDOMEN - 2 VIEW  COMPARISON:  05/31/2014  FINDINGS: Previously administered enteric contrast is now seen extending through the colon. There is persistent moderate gas distention of several loops of small bowel with index loop within the left mid hemiabdomen measuring 3.9 cm diameter. No pneumoperitoneum, pneumatosis or portal venous gas.  Post cholecystectomy.  Moderate scoliotic curvature of the thoracolumbar spine with associated moderate to severe multilevel DDD, incompletely evaluated.  IMPRESSION: Findings suggestive of improved though persistent partial small bowel obstruction. Continued attention on follow-up is recommended.   Electronically Signed   By: Eldridge Abrahams.D.  On: 06/02/2014 09:11    Medications: Scheduled Meds: . [MAR Hold] sodium chloride   Intravenous Once  . [MAR Hold] feeding supplement (RESOURCE BREEZE)  1 Container Oral TID BM  . [MAR Hold] heparin  5,000 Units Subcutaneous 3 times per day  . [MAR Hold] metoprolol  50 mg Oral BID  . [MAR Hold] sertraline  50 mg Oral Daily  . [MAR Hold] sodium chloride  3 mL Intravenous Q12H      LOS: 2 days   RAI,RIPUDEEP M.D. [facility] 06/02/2014, 12:38 PM Pager: [phone]  If 7PM-7AM, please contact night-coverage [email or web address] Password [email or web address]

## 2014-06-02 NOTE — Consult Note (Addendum)
WOC consult requested for stoma site marking; colostomy surgery planned for today. Assessed patient while standing, sitting and lying.  Mark placed within rectus muscle, in line of vision, in area free of folds.  Site marked in left lower quad 7 cm to left of umbilicus and 7 cm above.  There are skin folds located above and below this site which should be avoided if possible.  Educational materials left at bedside; demonstrated pouch appearance and briefly discussed pouching routines.  Will plan to follow patient after surgery for further educational sessions.  Pt denies further questions at this time. Julien Girt MSN, RN, [place], [place], [place]

## 2014-06-02 NOTE — Anesthesia Preprocedure Evaluation (Signed)
Anesthesia Evaluation  Patient identified by MRN, date of birth, ID band Patient awake    Reviewed: Allergy & Precautions, H&P , NPO status , Patient's Chart, lab work & pertinent test results, reviewed documented beta blocker date and time   History of Anesthesia Complications (+) PONV  Airway        Dental   Pulmonary former smoker,          Cardiovascular hypertension, + CAD  EF 55% 2011, [facility]   Neuro/Psych Depression    GI/Hepatic GERD-  Medicated,  Endo/Other    Renal/GU Creat 1.4, gfr 40     Musculoskeletal   Abdominal   Peds  Hematology H/H 8.6/29, blood has been T&C   Anesthesia Other Findings   Reproductive/Obstetrics                             Anesthesia Physical Anesthesia Plan  ASA: III  Anesthesia Plan: General   Post-op Pain Management:    Induction:   Airway Management Planned: Oral ETT  Additional Equipment:   Intra-op Plan:   Post-operative Plan: Extubation in OR  Informed Consent: I have reviewed the patients History and Physical, chart, labs and discussed the procedure including the risks, benefits and alternatives for the proposed anesthesia with the patient or authorized representative who has indicated his/her understanding and acceptance.     Plan Discussed with:   Anesthesia Plan Comments: (Avoid toradal, 2nd Iv after starting if other IV not 18 or bigger)        Anesthesia Quick Evaluation

## 2014-06-02 NOTE — Progress Notes (Signed)
IV Fentanyl does not seem to be helping her pain. Dr Tresa Moore updated by E. [place] Therapist, sports. New order for up to 1mg  IV Dilaudid. Will cont to monitor closely.

## 2014-06-02 NOTE — Op Note (Signed)
Pre-op Diagnosis:  1.  Near-obstructing mass at ileocecal valve          2.  Rectovaginal fistula Post-op Diagnosis:  Same Procedure:  1.  Exploratory laparotomy   2.  Right hemicolectomy   3.  Descending colostomy Surgeon:  Maia Petties. Assistant:  Dr. Christie Beckers Anesthesia:  GETT  Indications: This is an 80 year old female who presents with abdominal pain and distention. She has had problems with a rectovaginal fistula for some time as well as some anemia and abdominal discomfort. However when she was admitted to the hospital 2 days ago a CT scan showed a large mass at her ileocecal valve that was causing a small bowel obstruction. We recommended resection as this likely represents a malignancy. We also discussed performing a descending colostomy to divert stool away from her rectovaginal fistula.  Description of procedure: The patient is brought to the operating room and placed in supine position on the operating room table. After an adequate level of general anesthesia was obtained, a Foley catheter was placed under sterile technique. The patient's abdomen was prepped with ChloraPrep and draped sterile fashion. A timeout was taken to ensure the proper patient and proper procedure. We made a vertical midline incision and dissection was carried down to the fascia. There is a small milk hernia. We open this bluntly. We into the peritoneal cavity and open the fascia to a length of the incision. There is a significant amount of free fluid within the abdomen. The small bowel is mildly dilated. We eviscerated small bowel out of the abdomen. The patient has some small bowel adhesions in the right lower quadrant. The mass is easily palpable cecum. There are adhesions in the right upper quadrant at the area of her previous open cholecystectomy. We spent for several minutes of the procedure take down adhesions in the right upper quadrant as well as no right lower quadrant. There is a fairly thick area of  scar tissue on the terminal ileum about 15 cm away from the ileocecal valve. We made the decision to resect this with the rest of our right hemicolectomy. We began mobilizing the cecum away from the lateral normal wall. We dissected these a sending colon away from the lateral wall. With take down the adhesions of the hepatic flexure to the edge of the liver. We continued mobilizing to the mid transverse colon. We divided the ileum just proximal to the after mentioned scarred area with a GIA-75 stapler. We divided the colon in the proximal transverse colon. The LigaSure device was used to take the mesentery of the right colon and the terminal ileum. The specimen was sent for pathologic examination. We irrigated thoroughly and inspected for hemostasis. I created a side-to-side stapled anastomosis using the GIA-75 stapler closing the anastomosis with a TA 60 stapler. The anastomosis is felt to be patent and under no tension. A reinforcing suture of 3-0 silk was placed at the crotch of the anastomosis.  We replaced the ileocolic anastomosis within the abdomen. We then examined the left side. She had previously been marked by the ostomy nurse. The descending colon was mobilized from the lateral normal wall by dividing the peritoneum at the white line of Toldt. We mobilized the left colon medially until we were able to bring the colon up to the anterior abdominal wall. We divided the descending colon with a GIA-75 stapler. The distal stump was tagged with a 2-0 Prolene suture. We took a small amount of the mesentery with the LigaSure  device to allow full mobilization of the stump of the descending colon. We excised a small circle skin and divided the fascia through the rectus muscle. The  proximal stump was brought out through the  skin incision. We pexed the colostomy to the anterior abdominal wall with a couple of 3-0 silk sutures. We then irrigated the abdomen thoroughly and inspected for hemostasis. The fascia was  reapproximated with double-stranded #1 PDS suture. We irrigated the subcutaneous tissues thoroughly and stapled the skin. Clinical dressing was applied. We then matured the colostomy on the left side with interrupted 3-0 Vicryl sutures after amputating the staple line. An ostomy appliance was applied. All sponge, initially, and needle counts are correct. The patient was extubated and brought to recovery in stable condition.   [name]. Georgette Dover, MD, [facility] Surgery  General/ Trauma Surgery  06/02/2014 12:55 PM

## 2014-06-02 NOTE — Progress Notes (Signed)
Lunch relief by S. Gregson RN 

## 2014-06-02 NOTE — Anesthesia Procedure Notes (Signed)
Procedure Name: Intubation Date/Time: 06/02/2014 10:45 AM Performed by: [place] Pink Pre-anesthesia Checklist: Patient identified, Emergency Drugs available, Suction available, Patient being monitored and Timeout performed Patient Re-evaluated:Patient Re-evaluated prior to inductionOxygen Delivery Method: Circle system utilized Preoxygenation: Pre-oxygenation with 100% oxygen Intubation Type: IV induction, Rapid sequence and Cricoid Pressure applied Laryngoscope Size: Mac and 3 Grade View: Grade I Tube type: Oral Tube size: 7.0 mm Number of attempts: 1 Airway Equipment and Method: Stylet and LTA kit utilized Placement Confirmation: ETT inserted through vocal cords under direct vision,  positive ETCO2 and breath sounds checked- equal and bilateral Secured at: 20 cm Tube secured with: Tape Dental Injury: Teeth and Oropharynx as per pre-operative assessment

## 2014-06-03 ENCOUNTER — Encounter (HOSPITAL_COMMUNITY): Payer: Self-pay | Admitting: Surgery

## 2014-06-03 LAB — BASIC METABOLIC PANEL
Anion gap: 14 (ref 5–15)
BUN: 25 mg/dL — ABNORMAL HIGH (ref 6–23)
CO2: 21 mEq/L (ref 19–32)
Calcium: 8.4 mg/dL (ref 8.4–10.5)
Chloride: 103 mEq/L (ref 96–112)
Creatinine, Ser: 1.11 mg/dL — ABNORMAL HIGH (ref 0.50–1.10)
GFR calc Af Amer: 52 mL/min — ABNORMAL LOW (ref >90)
GFR calc non Af Amer: 45 mL/min — ABNORMAL LOW (ref >90)
Glucose, Bld: 174 mg/dL — ABNORMAL HIGH (ref 70–99)
Potassium: 4.8 mEq/L (ref 3.7–5.3)
Sodium: 138 mEq/L (ref 137–147)

## 2014-06-03 LAB — CBC
HCT: 32.1 % — ABNORMAL LOW (ref 36.0–46.0)
Hemoglobin: 10.2 g/dL — ABNORMAL LOW (ref 12.0–15.0)
MCH: 25.2 pg — ABNORMAL LOW (ref 26.0–34.0)
MCHC: 31.8 g/dL (ref 30.0–36.0)
MCV: 79.3 fL (ref 78.0–100.0)
Platelets: 224 10*3/uL (ref 150–400)
RBC: 4.05 MIL/uL (ref 3.87–5.11)
RDW: 22.1 % — ABNORMAL HIGH (ref 11.5–15.5)
WBC: 15.1 10*3/uL — ABNORMAL HIGH (ref 4.0–10.5)

## 2014-06-03 LAB — GLUCOSE, CAPILLARY
Glucose-Capillary: 148 mg/dL — ABNORMAL HIGH (ref 70–99)
Glucose-Capillary: 134 mg/dL — ABNORMAL HIGH (ref 70–99)

## 2014-06-03 MED ORDER — DIPHENHYDRAMINE HCL 50 MG/ML IJ SOLN: 12.5000 mg | Freq: Four times a day (QID) | INTRAMUSCULAR | Status: DC | PRN

## 2014-06-03 MED ORDER — HYDROMORPHONE 0.3 MG/ML IV SOLN: INTRAVENOUS | Status: DC

## 2014-06-03 MED ORDER — ONDANSETRON HCL 4 MG/2ML IJ SOLN
4.0000 mg | Freq: Four times a day (QID) | INTRAMUSCULAR | Status: DC | PRN
Start: 2014-06-03 — End: 2014-06-03

## 2014-06-03 MED ORDER — CHLORHEXIDINE GLUCONATE 0.12 % MT SOLN
15.0000 mL | Freq: Two times a day (BID) | OROMUCOSAL | Status: DC
  Administered 2014-06-03 – 2014-06-10 (×13): 15 mL via OROMUCOSAL
  Filled 2014-06-03 (×15): qty 15

## 2014-06-03 MED ORDER — CETYLPYRIDINIUM CHLORIDE 0.05 % MT LIQD
7.0000 mL | Freq: Two times a day (BID) | OROMUCOSAL | Status: DC
  Administered 2014-06-03 – 2014-06-09 (×11): 7 mL via OROMUCOSAL

## 2014-06-03 MED ORDER — DIPHENHYDRAMINE HCL 12.5 MG/5ML PO ELIX
12.5000 mg | ORAL_SOLUTION | Freq: Four times a day (QID) | ORAL | Status: DC | PRN
  Filled 2014-06-03: qty 5

## 2014-06-03 MED ORDER — LORAZEPAM 0.5 MG PO TABS
0.5000 mg | ORAL_TABLET | Freq: Four times a day (QID) | ORAL | Status: DC | PRN
  Administered 2014-06-03 – 2014-06-06 (×5): 0.5 mg via ORAL
  Filled 2014-06-03 (×5): qty 1

## 2014-06-03 MED ORDER — SODIUM CHLORIDE 0.9 % IV SOLN: INTRAVENOUS | Status: DC

## 2014-06-03 MED ORDER — SODIUM CHLORIDE 0.9 % IJ SOLN: 9.0000 mL | INTRAMUSCULAR | Status: DC | PRN

## 2014-06-03 MED ORDER — NALOXONE HCL 0.4 MG/ML IJ SOLN: 0.4000 mg | INTRAMUSCULAR | Status: DC | PRN

## 2014-06-03 MED ORDER — HYDROMORPHONE 0.3 MG/ML IV SOLN
INTRAVENOUS | Status: DC
  Administered 2014-06-03 (×2): 1.79 mg via INTRAVENOUS
  Administered 2014-06-03: 1.19 mg via INTRAVENOUS
  Administered 2014-06-03: 0.3 mg via INTRAVENOUS
  Administered 2014-06-04: 0.99 mg via INTRAVENOUS
  Administered 2014-06-04: 2.19 mg via INTRAVENOUS
  Administered 2014-06-04: 1.19 mg via INTRAVENOUS
  Administered 2014-06-04: 06:00:00 via INTRAVENOUS
  Administered 2014-06-04: 2.19 mg via INTRAVENOUS
  Administered 2014-06-04: 0.999 mg via INTRAVENOUS
  Administered 2014-06-04: 0.799 mg via INTRAVENOUS
  Administered 2014-06-05: 01:00:00 via INTRAVENOUS
  Administered 2014-06-05: 1.59 mg via INTRAVENOUS
  Administered 2014-06-05: 2.91 mg via INTRAVENOUS
  Administered 2014-06-05: 0.8 mg via INTRAVENOUS
  Administered 2014-06-05: 1.99 mg via INTRAVENOUS
  Administered 2014-06-05: 1 mg via INTRAVENOUS
  Administered 2014-06-06: 3.19 mg via INTRAVENOUS
  Administered 2014-06-06: 0.4 mg via INTRAVENOUS
  Administered 2014-06-06: 1.39 mg via INTRAVENOUS
  Administered 2014-06-06: 03:00:00 via INTRAVENOUS
  Administered 2014-06-06: 0.599 mg via INTRAVENOUS
  Filled 2014-06-03 (×4): qty 25

## 2014-06-03 MED ORDER — MORPHINE SULFATE (PF) 1 MG/ML IV SOLN: INTRAVENOUS | Status: DC

## 2014-06-03 NOTE — Consult Note (Addendum)
WOC ostomy consult note Stoma type/location:  Colostomy surgery performed yesterday, stoma intact to left lower quad.  Stoma assessment/size: Stoma dark red when visualized through pouch, flush with skin level.   Peristomal assessment: Current pouch intact with good seal, not removed to perform pouch change since it is the first post-op day and patient is in pain. Output No stool or flatus in pouch at this time. Small amt dark brown drainage. Ostomy pouching: 1pc Education provided: Will demonstrate pouch change and perform pouching educational session when pt stable and out of ICU.  Educational materials left at bedside with extra pouching supplies.  No family members at bedside at this time.  Pt denies questions today regarding ostomy. Julien Girt MSN, RN, [place], [place], [place]

## 2014-06-03 NOTE — Progress Notes (Signed)
Patient ID: [name] Doane  female  WGN:[id number]    DOB: 1929/12/14    DOA: 05/31/2014  PCP: Nyoka Cowden, MD   Brief history of present illness Patient is a 80 year old female with chronic anemia, chronic diastolic CHF, colovaginal fistula and diverticulosis presented with abdominal pain. She reported she has chronic abdominal pain which was getting worse for the past couple of weeks, nausea, vomiting but no severe weight loss. In the ED, CT scan of the abdomen and pelvis showed 6 cm apple core lesion involving the ED was cecal valve concerning for colon cancer. Patient was admitted for further workup.  Assessment/Plan: Principal Problem:   Colon tumor/ SBO: Postop day #1, status post exploratory laparotomy, rt hemicolectomy, descending colostomy  - CT finding of 6 cm apple core lesion concerning for colon cancer - gastroenterology was consulted, patient was seen by Dr. Olevia Perches, general surgery following, per per surgery, no benefit to a colonoscopy as there would be significant difficulty in trying to prep her colon  Active Problems: Chronic diastolic CHF - 2-D echo in 2011 showed EF 60% with grade 1 diastolic dysfunction, currently on gentle IV fluid hydration, Lasix and Aldactone, are held for now  Anemia: Acute on chronic, microcytic iron deficiency anemia likely due to chronic blood loss and colon cancer - Transfuse 2 units packed RBCs postoperatively  DVT Prophylaxis:heparin subcutaneous  Code Status:full code  Family Communication:discussed with patient's son  at bedside  Disposition:  Consultants:  Surgery  Gastroenterology  Procedures: exploratory laparotomy, rt hemicolectomy, descending colostomy 11/19   Antibiotics: IV Cefotan   Subjective: Complaining of abdominal pain, sitting up in the chair, complaining of pain, worn out  Objective: Weight change:   Intake/Output Summary (Last 24 hours) at 06/03/14 1108 Last data filed at 06/03/14 0908   Gross per 24 hour  Intake 2980.5 ml  Output   1120 ml  Net 1860.5 ml   Blood pressure 125/63, pulse 92, temperature 97.6 F (36.4 C), temperature source Oral, resp. rate 20, height 5\' 3"  (1.6 m), weight 84.8 kg (186 lb 15.2 oz), SpO2 95 %.  Physical Exam: General: A x O x3 CVS: S1-S2 clear Chest: CTAB Abdomen: Soft, hypoactive bowel sounds, ostomy + Extremities: no c/c/e bilaterally   Lab Results: Basic Metabolic Panel:  Recent Labs Lab 06/02/14 0048 06/03/14 0251  NA 137 138  K 4.8 4.8  CL 102 103  CO2 25 21  GLUCOSE 106* 174*  BUN 23 25*  CREATININE 1.25* 1.11*  CALCIUM 8.5 8.4   Liver Function Tests:  Recent Labs Lab 05/31/14 1204  AST 29  ALT 12  ALKPHOS 68  BILITOT 0.2*  PROT 7.5  ALBUMIN 3.4*   No results for input(s): LIPASE, AMYLASE in the last 168 hours. No results for input(s): AMMONIA in the last 168 hours. CBC:  Recent Labs Lab 05/31/14 1204  06/02/14 0048 06/03/14 0251  WBC 7.5  < > 8.1 15.1*  NEUTROABS 6.1  --   --   --   HGB 9.6*  < > 8.6* 10.2*  HCT 32.0*  < > 29.6* 32.1*  MCV 79.0  < > 78.7 79.3  PLT 237  < > 221 224  < > = values in this interval not displayed. Cardiac Enzymes: No results for input(s): CKTOTAL, CKMB, CKMBINDEX, TROPONINI in the last 168 hours. BNP: Invalid input(s): POCBNP CBG: No results for input(s): GLUCAP in the last 168 hours.   Micro Results: Recent Results (from the past 240 hour(s))  MRSA  PCR Screening     Status: None   Collection Time: 05/31/14  8:56 PM  Result Value Ref Range Status   MRSA by PCR NEGATIVE NEGATIVE Final    Comment:        The GeneXpert MRSA Assay (FDA approved for NASAL specimens only), is one component of a comprehensive MRSA colonization surveillance program. It is not intended to diagnose MRSA infection nor to guide or monitor treatment for MRSA infections.     Studies/Results: Ct Abdomen Pelvis W Contrast  05/31/2014   ADDENDUM REPORT: 05/31/2014 16:27   ADDENDUM: Study discussed by telephone with Dr. Scot Jun On 05/31/2014 at 1625 hrs.   Electronically Signed   By: Lars Pinks M.D.   On: 05/31/2014 16:27   05/31/2014   CLINICAL DATA:  80 year old female with right side abdominal pain, severe in constant for 2 days. Diarrhea. Initial encounter.  EXAM: CT ABDOMEN AND PELVIS WITH CONTRAST  TECHNIQUE: Multidetector CT imaging of the abdomen and pelvis was performed using the standard protocol following bolus administration of intravenous contrast.  CONTRAST:  41mL OMNIPAQUE IOHEXOL 300 MG/ML  SOLN  COMPARISON:  04/28/2014 and earlier.  FINDINGS: Negative lung bases. Mild cardiomegaly. No pericardial or pleural effusion.  Stable visualized osseous structures.  Moderate volume of free fluid in the pelvis is new. Decompressed bladder. Negative rectum. Sigmoid diverticulosis re - identified, no definite adjacent mesenteric inflammation.  Decompressed left colon, transverse colon, at hepatic flexure.  Soft tissue mass like thickening at the ileocecal valve. The cecum is largely decompressed, but the small bowel is been diffusely dilated distally, with a gradual transition to more normal caliber proximal small bowel. The mass like soft tissue appears to affect both this cecum and terminal ileum, measuring up to 6 cm in length, by up to 4 cm in thickness. Pericecal lymph nodes are identified and appear stable small distal small bowel mesenteric nodes up to 8 mm short axis.  In addition to free fluid in the pelvis and a small bowel mesentery there is a small volume of fluid along the liver. No liver mass identified. Surgically absent gallbladder as before.  Small volume perisplenic fluid. Spleen, pancreas, adrenal glands, and kidneys are stable. Portal venous system is patent. Major arterial structures in the abdomen and pelvis are patent with aortoiliac calcified atherosclerosis noted. No free air. Moderate volume gastric paraesophageal or hiatal hernia contains contrast  today. This has not significantly changed.  IMPRESSION: Acute small bowel obstruction due to what appears to be a soft tissue mass (apple core lesion) at the ileocecal valve suspicious for intestinal adenocarcinoma. Up to 8 mm pericecal lymph nodes are stable. No liver or distant metastasis identified. Small volume free fluid in the abdomen and pelvis, favor reactive. No free air.  Electronically Signed: By: Lars Pinks M.D. On: 05/31/2014 16:16   Dg Abd 2 Views  06/02/2014   CLINICAL DATA:  Upper abdominal pain and constipation. Small bowel obstruction.  EXAM: ABDOMEN - 2 VIEW  COMPARISON:  05/31/2014  FINDINGS: Previously administered enteric contrast is now seen extending through the colon. There is persistent moderate gas distention of several loops of small bowel with index loop within the left mid hemiabdomen measuring 3.9 cm diameter. No pneumoperitoneum, pneumatosis or portal venous gas.  Post cholecystectomy.  Moderate scoliotic curvature of the thoracolumbar spine with associated moderate to severe multilevel DDD, incompletely evaluated.  IMPRESSION: Findings suggestive of improved though persistent partial small bowel obstruction. Continued attention on follow-up is recommended.   Electronically  Signed   By: Sandi Mariscal M.D.   On: 06/02/2014 09:11    Medications: Scheduled Meds: . [MAR Hold] sodium chloride   Intravenous Once  . antiseptic oral rinse  7 mL Mouth Rinse q12n4p  . cefoTEtan (CEFOTAN) IV  1 g Intravenous Q12H  . chlorhexidine  15 mL Mouth Rinse BID  . heparin  5,000 Units Subcutaneous 3 times per day  . HYDROmorphone PCA 0.3 mg/mL   Intravenous 6 times per day  . metoprolol  50 mg Oral BID  . sertraline  50 mg Oral Daily  . sodium chloride  3 mL Intravenous Q12H      LOS: 3 days   RAI,RIPUDEEP M.D. [facility] 06/03/2014, 11:08 AM Pager: [phone]  If 7PM-7AM, please contact night-coverage [email or web address] Password [email or web address]

## 2014-06-03 NOTE — Progress Notes (Signed)
Medicare Important Message given? YES  (If response is "NO", the following Medicare IM given date fields will be blank)  Date Medicare IM given: 06/03/14 Medicare IM given by:  Navie Lamoreaux  

## 2014-06-03 NOTE — Progress Notes (Signed)
1 Day Post-Op  Subjective: Patient is in Step-Down unit Sitting in chair Complaining of abdominal pain Mild nausea  Objective: Vital signs in last 24 hours: Temp:  [97.2 F (36.2 C)-99 F (37.2 C)] 97.8 F (36.6 C) (11/20 0300) Pulse Rate:  [62-98] 81 (11/20 0300) Resp:  [12-28] 25 (11/20 0300) BP: (109-149)/(43-94) 128/56 mmHg (11/20 0300) SpO2:  [92 %-100 %] 93 % (11/20 0300) Weight:  [186 lb 15.2 oz (84.8 kg)] 186 lb 15.2 oz (84.8 kg) Jun 18, 2023 1828) Last BM Date: 06/01/14  Intake/Output from previous day: June 18, 2023 0701 - 11/20 0700 In: 2783 [P.O.:20; I.V.:2378; Blood:335; IV Piggyback:50] Out: 995 [Urine:845; Blood:150] Intake/Output this shift:    General appearance: alert, cooperative and no distress Resp: clear to auscultation bilaterally Cardio: regular rate and rhythm, S1, S2 normal, no murmur, click, rub or gallop GI: soft, incisional tenderness; hypoactive bowel sounds Staple line intact with minimal drainage on honeycomb dressing Ostomy pink, viable; minimal output  Lab Results:   Recent Labs  06/17/2014 0048 06/03/14 0251  WBC 8.1 15.1*  HGB 8.6* 10.2*  HCT 29.6* 32.1*  PLT 221 224   BMET  Recent Labs  2014-06-17 0048 06/03/14 0251  NA 137 138  K 4.8 4.8  CL 102 103  CO2 25 21  GLUCOSE 106* 174*  BUN 23 25*  CREATININE 1.25* 1.11*  CALCIUM 8.5 8.4   PT/INR  Recent Labs  05/31/14 1909  LABPROT 15.2  INR 1.19   ABG No results for input(s): PHART, HCO3 in the last 72 hours.  Invalid input(s): PCO2, PO2  Studies/Results: Dg Abd 2 Views  2014-06-17   CLINICAL DATA:  Upper abdominal pain and constipation. Small bowel obstruction.  EXAM: ABDOMEN - 2 VIEW  COMPARISON:  05/31/2014  FINDINGS: Previously administered enteric contrast is now seen extending through the colon. There is persistent moderate gas distention of several loops of small bowel with index loop within the left mid hemiabdomen measuring 3.9 cm diameter. No pneumoperitoneum,  pneumatosis or portal venous gas.  Post cholecystectomy.  Moderate scoliotic curvature of the thoracolumbar spine with associated moderate to severe multilevel DDD, incompletely evaluated.  IMPRESSION: Findings suggestive of improved though persistent partial small bowel obstruction. Continued attention on follow-up is recommended.   Electronically Signed   By: Sandi Mariscal M.D.   On: Jun 17, 2014 09:11    Anti-infectives: Anti-infectives    Start     Dose/Rate Route Frequency Ordered Stop   Jun 17, 2014 2200  cefoTEtan (CEFOTAN) 1 g in dextrose 5 % 50 mL IVPB     1 g100 mL/hr over 30 Minutes Intravenous Every 12 hours 17-Jun-2014 1836     2014/06/17 0600  [MAR Hold]  cefoTEtan (CEFOTAN) 2 g in dextrose 5 % 50 mL IVPB     (MAR Hold since 2014/06/17 0935)   2 g100 mL/hr over 30 Minutes Intravenous On call to O.R. 06/01/14 1519 06/17/14 1047      Assessment/Plan: s/p Procedure(s): RIGHT PARTIAL COLECTOMY  (N/A) DIVERTING DESCENDING END COLOSTOMY (N/A) d/c foley Keep on clears  Will try reduced dose Dilaudid PCA for pain control PRN PO Ativan per patient's request (anxiety) Physical therapy - evaluate and treat (will she need rehab?) Ostomy nurse consult   LOS: 3 days    Jadence Kinlaw K. 06/03/2014

## 2014-06-04 LAB — CBC
HCT: 30.4 % — ABNORMAL LOW (ref 36.0–46.0)
Hemoglobin: 9.5 g/dL — ABNORMAL LOW (ref 12.0–15.0)
MCH: 24.4 pg — ABNORMAL LOW (ref 26.0–34.0)
MCHC: 31.3 g/dL (ref 30.0–36.0)
MCV: 77.9 fL — ABNORMAL LOW (ref 78.0–100.0)
Platelets: 205 10*3/uL (ref 150–400)
RBC: 3.9 MIL/uL (ref 3.87–5.11)
RDW: 22.8 % — ABNORMAL HIGH (ref 11.5–15.5)
WBC: 14.4 10*3/uL — ABNORMAL HIGH (ref 4.0–10.5)

## 2014-06-04 LAB — BASIC METABOLIC PANEL
Anion gap: 13 (ref 5–15)
BUN: 21 mg/dL (ref 6–23)
CO2: 21 mEq/L (ref 19–32)
Calcium: 8.3 mg/dL — ABNORMAL LOW (ref 8.4–10.5)
Chloride: 99 mEq/L (ref 96–112)
Creatinine, Ser: 0.98 mg/dL (ref 0.50–1.10)
GFR calc Af Amer: 61 mL/min — ABNORMAL LOW (ref >90)
GFR calc non Af Amer: 52 mL/min — ABNORMAL LOW (ref >90)
Glucose, Bld: 116 mg/dL — ABNORMAL HIGH (ref 70–99)
Potassium: 4.8 mEq/L (ref 3.7–5.3)
Sodium: 133 mEq/L — ABNORMAL LOW (ref 137–147)

## 2014-06-04 LAB — GLUCOSE, CAPILLARY: Glucose-Capillary: 132 mg/dL — ABNORMAL HIGH (ref 70–99)

## 2014-06-04 MED ORDER — ONDANSETRON HCL 4 MG/2ML IJ SOLN
4.0000 mg | Freq: Once | INTRAMUSCULAR | Status: AC
  Administered 2014-06-04: 4 mg via INTRAVENOUS
  Filled 2014-06-04: qty 2

## 2014-06-04 MED ORDER — ONDANSETRON HCL 4 MG/2ML IJ SOLN
4.0000 mg | INTRAMUSCULAR | Status: DC | PRN
  Administered 2014-06-04 – 2014-06-06 (×5): 4 mg via INTRAVENOUS
  Filled 2014-06-04 (×5): qty 2

## 2014-06-04 MED ORDER — ALUM & MAG HYDROXIDE-SIMETH 200-200-20 MG/5ML PO SUSP
30.0000 mL | Freq: Four times a day (QID) | ORAL | Status: DC | PRN
  Administered 2014-06-04 – 2014-06-05 (×3): 30 mL via ORAL
  Filled 2014-06-04 (×3): qty 30

## 2014-06-04 NOTE — Progress Notes (Signed)
Patient ID: [name]  female  URK:[id number]    DOB: Feb 15, 1930    DOA: 05/31/2014  PCP: Nyoka Cowden, MD   Brief history of present illness Patient is a 80 year old female with chronic anemia, chronic diastolic CHF, colovaginal fistula and diverticulosis presented with abdominal pain. She reported she has chronic abdominal pain which was getting worse for the past couple of weeks, nausea, vomiting but no severe weight loss. In the ED, CT scan of the abdomen and pelvis showed 6 cm apple core lesion involving the ED was cecal valve concerning for colon cancer. Patient was admitted for further workup.  Assessment/Plan: Principal Problem:   Colon tumor/ SBO: Postop day #2, status post exploratory laparotomy, rt hemicolectomy, descending colostomy  - CT finding of 6 cm apple core lesion concerning for colon cancer - surgery following - Started on clears, PTOT evaluation  Active Problems: Chronic diastolic CHF - 2-D echo in 2011 showed EF 60% with grade 1 diastolic dysfunction, currently on gentle IV fluid hydration, Lasix and Aldactone, are held for now  Anemia: Acute on chronic, microcytic iron deficiency anemia likely due to chronic blood loss and colon cancer - Transfused 2 units packed RBCs postoperatively - Hemoglobin stable  Leukocytosis: Improving, no fevers or chills    DVT Prophylaxis:heparin subcutaneous  Code Status:full code  Family Communication: no family at bedside  Disposition:  Consultants:  Surgery  Gastroenterology  Procedures: exploratory laparotomy, rt hemicolectomy, descending colostomy 11/19   Antibiotics: IV Cefotan   Subjective: Feels very weak and nauseous today  Objective: Weight change:   Intake/Output Summary (Last 24 hours) at 06/04/14 0957 Last data filed at 06/04/14 0600  Gross per 24 hour  Intake   1073 ml  Output    600 ml  Net    473 ml   Blood pressure 158/63, pulse 111, temperature 97.6 F (36.4 C),  temperature source Oral, resp. rate 18, height 5\' 3"  (1.6 m), weight 84.8 kg (186 lb 15.2 oz), SpO2 96 %.  Physical Exam: General: A x O x3 CVS: S1-S2 clear Chest: CTAB Abdomen: Soft, + bowel sounds, ostomy + Extremities: no c/c/e    Lab Results: Basic Metabolic Panel:  Recent Labs Lab 06/03/14 0251 06/04/14 0313  NA 138 133*  K 4.8 4.8  CL 103 99  CO2 21 21  GLUCOSE 174* 116*  BUN 25* 21  CREATININE 1.11* 0.98  CALCIUM 8.4 8.3*   Liver Function Tests:  Recent Labs Lab 05/31/14 1204  AST 29  ALT 12  ALKPHOS 68  BILITOT 0.2*  PROT 7.5  ALBUMIN 3.4*   No results for input(s): LIPASE, AMYLASE in the last 168 hours. No results for input(s): AMMONIA in the last 168 hours. CBC:  Recent Labs Lab 05/31/14 1204  06/03/14 0251 06/04/14 0313  WBC 7.5  < > 15.1* 14.4*  NEUTROABS 6.1  --   --   --   HGB 9.6*  < > 10.2* 9.5*  HCT 32.0*  < > 32.1* 30.4*  MCV 79.0  < > 79.3 77.9*  PLT 237  < > 224 205  < > = values in this interval not displayed. Cardiac Enzymes: No results for input(s): CKTOTAL, CKMB, CKMBINDEX, TROPONINI in the last 168 hours. BNP: Invalid input(s): POCBNP CBG:  Recent Labs Lab 06/03/14 1132 06/03/14 1537 06/04/14 0237  GLUCAP 148* 134* 132*     Micro Results: Recent Results (from the past 240 hour(s))  MRSA PCR Screening     Status: None  Collection Time: 05/31/14  8:56 PM  Result Value Ref Range Status   MRSA by PCR NEGATIVE NEGATIVE Final    Comment:        The GeneXpert MRSA Assay (FDA approved for NASAL specimens only), is one component of a comprehensive MRSA colonization surveillance program. It is not intended to diagnose MRSA infection nor to guide or monitor treatment for MRSA infections.     Studies/Results: Ct Abdomen Pelvis W Contrast  05/31/2014   ADDENDUM REPORT: 05/31/2014 16:27  ADDENDUM: Study discussed by telephone with Dr. Scot Jun On 05/31/2014 at 1625 hrs.   Electronically Signed   By: Lars Pinks  M.D.   On: 05/31/2014 16:27   05/31/2014   CLINICAL DATA:  80 year old female with right side abdominal pain, severe in constant for 2 days. Diarrhea. Initial encounter.  EXAM: CT ABDOMEN AND PELVIS WITH CONTRAST  TECHNIQUE: Multidetector CT imaging of the abdomen and pelvis was performed using the standard protocol following bolus administration of intravenous contrast.  CONTRAST:  26mL OMNIPAQUE IOHEXOL 300 MG/ML  SOLN  COMPARISON:  04/28/2014 and earlier.  FINDINGS: Negative lung bases. Mild cardiomegaly. No pericardial or pleural effusion.  Stable visualized osseous structures.  Moderate volume of free fluid in the pelvis is new. Decompressed bladder. Negative rectum. Sigmoid diverticulosis re - identified, no definite adjacent mesenteric inflammation.  Decompressed left colon, transverse colon, at hepatic flexure.  Soft tissue mass like thickening at the ileocecal valve. The cecum is largely decompressed, but the small bowel is been diffusely dilated distally, with a gradual transition to more normal caliber proximal small bowel. The mass like soft tissue appears to affect both this cecum and terminal ileum, measuring up to 6 cm in length, by up to 4 cm in thickness. Pericecal lymph nodes are identified and appear stable small distal small bowel mesenteric nodes up to 8 mm short axis.  In addition to free fluid in the pelvis and a small bowel mesentery there is a small volume of fluid along the liver. No liver mass identified. Surgically absent gallbladder as before.  Small volume perisplenic fluid. Spleen, pancreas, adrenal glands, and kidneys are stable. Portal venous system is patent. Major arterial structures in the abdomen and pelvis are patent with aortoiliac calcified atherosclerosis noted. No free air. Moderate volume gastric paraesophageal or hiatal hernia contains contrast today. This has not significantly changed.  IMPRESSION: Acute small bowel obstruction due to what appears to be a soft tissue  mass (apple core lesion) at the ileocecal valve suspicious for intestinal adenocarcinoma. Up to 8 mm pericecal lymph nodes are stable. No liver or distant metastasis identified. Small volume free fluid in the abdomen and pelvis, favor reactive. No free air.  Electronically Signed: By: Lars Pinks M.D. On: 05/31/2014 16:16   Dg Abd 2 Views  06/02/2014   CLINICAL DATA:  Upper abdominal pain and constipation. Small bowel obstruction.  EXAM: ABDOMEN - 2 VIEW  COMPARISON:  05/31/2014  FINDINGS: Previously administered enteric contrast is now seen extending through the colon. There is persistent moderate gas distention of several loops of small bowel with index loop within the left mid hemiabdomen measuring 3.9 cm diameter. No pneumoperitoneum, pneumatosis or portal venous gas.  Post cholecystectomy.  Moderate scoliotic curvature of the thoracolumbar spine with associated moderate to severe multilevel DDD, incompletely evaluated.  IMPRESSION: Findings suggestive of improved though persistent partial small bowel obstruction. Continued attention on follow-up is recommended.   Electronically Signed   By: Eldridge Abrahams.D.  On: 06/02/2014 09:11    Medications: Scheduled Meds: . antiseptic oral rinse  7 mL Mouth Rinse q12n4p  . cefoTEtan (CEFOTAN) IV  1 g Intravenous Q12H  . chlorhexidine  15 mL Mouth Rinse BID  . heparin  5,000 Units Subcutaneous 3 times per day  . HYDROmorphone PCA 0.3 mg/mL   Intravenous 6 times per day  . metoprolol  50 mg Oral BID  . sertraline  50 mg Oral Daily  . sodium chloride  3 mL Intravenous Q12H      LOS: 4 days   RAI,RIPUDEEP M.D. [facility] 06/04/2014, 9:57 AM Pager: [phone]  If 7PM-7AM, please contact night-coverage [email or web address] Password [email or web address]

## 2014-06-04 NOTE — Evaluation (Signed)
Physical Therapy Evaluation Patient Details Name: [name] Kidney MRN: [id number] DOB: [birth year] Today's Date: 06/04/2014   History of Present Illness   80 year old female with chronic anemia, chronic diastolic CHF, colovaginal fistula and diverticulosis presented with abdominal pain. She reported she has chronic abdominal pain which was getting worse for the past couple of weeks, nausea, vomiting but no severe weight loss.  pt is s/p exploratory laparotomy, Rt hemicolectomy and descending colostomy.   Clinical Impression  Patient is s/p above surgery resulting in functional limitations due to the deficits listed below (see PT Problem List). Patient will benefit from skilled PT to increase their independence and safety with mobility to allow discharge to the venue listed below. Pt very deconditioned and requiring 2 person (A) for mobility. Pt lives at home alone. Recommend SNF for post acute rehab. Daughters present during session and very supportive.      Follow Up Recommendations SNF;Supervision/Assistance - 24 hour    Equipment Recommendations  Rolling walker with 5" wheels    Recommendations for Other Services OT consult     Precautions / Restrictions Precautions Precautions: Fall Precaution Comments: pt with ostomy  Required Braces or Orthoses: Other Brace/Splint Other Brace/Splint: abdominal binder  Restrictions Weight Bearing Restrictions: No      Mobility  Bed Mobility               General bed mobility comments: pt up in chair and returned to chair  Transfers Overall transfer level: Needs assistance Equipment used: Rolling walker (2 wheeled) Transfers: Sit to/from Stand Sit to Stand: +2 physical assistance;Min assist         General transfer comment: cues for hand placement and technique with RW; 2 person (A) to power up and balance   Ambulation/Gait Ambulation/Gait assistance: +2 physical assistance;Mod assist Ambulation Distance (Feet): 3  Feet Assistive device: Rolling walker (2 wheeled) Gait Pattern/deviations: Step-through pattern;Shuffle;Decreased stride length Gait velocity: decreased Gait velocity interpretation: Below normal speed for age/gender General Gait Details: pt very fatigued and requiring max encouragement for mobility; pt decondioned; cues for safety and 2 person (A) to manage RW and balance; pt with heavy lean posteriorly  Stairs            Wheelchair Mobility    Modified Rankin (Stroke Patients Only)       Balance Overall balance assessment: Needs assistance Sitting-balance support: Feet supported;No upper extremity supported Sitting balance-Leahy Scale: Fair Sitting balance - Comments: guarded    Standing balance support: During functional activity;Bilateral upper extremity supported Standing balance-Leahy Scale: Zero Standing balance comment: 2 person (A) to balance                              Pertinent Vitals/Pain Pain Assessment: 0-10 Pain Score: 9  Pain Location: surgical site Pain Descriptors / Indicators:  ("ripping) Pain Intervention(s): Repositioned;Monitored during session;Premedicated before session    [place] expects to be discharged to:: Skilled nursing facility Living Arrangements: Alone               Additional Comments: Pt lives at home alone     Prior Function Level of Independence: Independent with assistive device(s)         Comments: ambulates with rollator     Hand Dominance   Dominant Hand: Right    Extremity/Trunk Assessment   Upper Extremity Assessment: Defer to OT evaluation           Lower Extremity Assessment: Generalized  weakness      Cervical / Trunk Assessment: Normal  Communication   Communication: No difficulties  Cognition Arousal/Alertness: Awake/alert Behavior During Therapy: WFL for tasks assessed/performed Overall Cognitive Status: Within Functional Limits for tasks assessed                       General Comments General comments (skin integrity, edema, etc.): discussed D/C plan with pt; pt agreeable at this time     Exercises Low Level/ICU Exercises Ankle Circles/Pumps: AROM;Both;10 reps;Seated      Assessment/Plan    PT Assessment Patient needs continued PT services  PT Diagnosis Difficulty walking;Acute pain;Generalized weakness   PT Problem List Decreased strength;Decreased activity tolerance;Decreased balance;Decreased mobility;Decreased knowledge of use of DME;Pain  PT Treatment Interventions Gait training;DME instruction;Functional mobility training;Therapeutic activities;Therapeutic exercise;Balance training;Neuromuscular re-education;Patient/family education   PT Goals (Current goals can be found in the Care Plan section) Acute Rehab PT Goals Patient Stated Goal: to go home PT Goal Formulation: With patient/family Time For Goal Achievement: 06/11/14 Potential to Achieve Goals: Good    Frequency Min 3X/week   Barriers to discharge Decreased caregiver support lives alone    Co-evaluation               End of Session Equipment Utilized During Treatment: Gait belt Activity Tolerance: Patient limited by fatigue;Patient limited by pain Patient left: in chair;with call bell/phone within reach;with family/visitor present Nurse Communication: Mobility status;Precautions         Time: 7035-0093 PT Time Calculation (min) (ACUTE ONLY): 16 min   Charges:   PT Evaluation $Initial PT Evaluation Tier I: 1 Procedure PT Treatments $Gait Training: 8-22 mins   PT G CodesGustavus Bryant, [place] 06/04/2014, 3:30 PM

## 2014-06-04 NOTE — Progress Notes (Signed)
2 Days Post-Op  Subjective: Quite sore, was up in chair most of the day yesterday  Objective: Vital signs in last 24 hours: Temp:  [97.6 F (36.4 C)-99.4 F (37.4 C)] 97.6 F (36.4 C) (11/21 0841) Pulse Rate:  [90-111] 111 (11/21 0841) Resp:  [12-30] 18 (11/21 0841) BP: (130-158)/(60-98) 158/63 mmHg (11/21 0841) SpO2:  [93 %-97 %] 96 % (11/21 0841) Last BM Date: 06/01/14  Intake/Output from previous day: 11/20 0701 - 11/21 0700 In: 1270.5 [P.O.:120; I.V.:1100.5; IV Piggyback:50] Out: 650 [Urine:650] Intake/Output this shift:    General appearance: cooperative Resp: clear to auscultation bilaterally Cardio: irreg GI: soft, active BS but nothing in bag, stoma pink  Lab Results:   Recent Labs  06/03/14 0251 06/04/14 0313  WBC 15.1* 14.4*  HGB 10.2* 9.5*  HCT 32.1* 30.4*  PLT 224 205   BMET  Recent Labs  06/03/14 0251 06/04/14 0313  NA 138 133*  K 4.8 4.8  CL 103 99  CO2 21 21  GLUCOSE 174* 116*  BUN 25* 21  CREATININE 1.11* 0.98  CALCIUM 8.4 8.3*   PT/INR No results for input(s): LABPROT, INR in the last 72 hours. ABG No results for input(s): PHART, HCO3 in the last 72 hours.  Invalid input(s): PCO2, PO2  Studies/Results: No results found.  Anti-infectives: Anti-infectives    Start     Dose/Rate Route Frequency Ordered Stop   06/02/14 2200  cefoTEtan (CEFOTAN) 1 g in dextrose 5 % 50 mL IVPB     1 g100 mL/hr over 30 Minutes Intravenous Every 12 hours 06/02/14 1836     06/02/14 0600  [MAR Hold]  cefoTEtan (CEFOTAN) 2 g in dextrose 5 % 50 mL IVPB     (MAR Hold since 06/02/14 0935)   2 g100 mL/hr over 30 Minutes Intravenous On call to O.R. 06/01/14 1519 06/02/14 1047      Assessment/Plan: s/p Procedure(s): RIGHT PARTIAL COLECTOMY  (N/A) DIVERTING DESCENDING END COLOSTOMY (N/A) Clears Await bowel function PT/OT I spoke with her family  LOS: 4 days    Rayce Brahmbhatt E 06/04/2014

## 2014-06-05 LAB — BASIC METABOLIC PANEL
Anion gap: 14 (ref 5–15)
BUN: 14 mg/dL (ref 6–23)
CO2: 23 mEq/L (ref 19–32)
Calcium: 8.3 mg/dL — ABNORMAL LOW (ref 8.4–10.5)
Chloride: 95 mEq/L — ABNORMAL LOW (ref 96–112)
Creatinine, Ser: 0.88 mg/dL (ref 0.50–1.10)
GFR calc Af Amer: 69 mL/min — ABNORMAL LOW (ref >90)
GFR calc non Af Amer: 60 mL/min — ABNORMAL LOW (ref >90)
Glucose, Bld: 106 mg/dL — ABNORMAL HIGH (ref 70–99)
Potassium: 4.3 mEq/L (ref 3.7–5.3)
Sodium: 132 mEq/L — ABNORMAL LOW (ref 137–147)

## 2014-06-05 LAB — CBC
HCT: 27.6 % — ABNORMAL LOW (ref 36.0–46.0)
Hemoglobin: 8.7 g/dL — ABNORMAL LOW (ref 12.0–15.0)
MCH: 25.1 pg — ABNORMAL LOW (ref 26.0–34.0)
MCHC: 31.5 g/dL (ref 30.0–36.0)
MCV: 79.5 fL (ref 78.0–100.0)
Platelets: 192 10*3/uL (ref 150–400)
RBC: 3.47 MIL/uL — ABNORMAL LOW (ref 3.87–5.11)
RDW: 22.7 % — ABNORMAL HIGH (ref 11.5–15.5)
WBC: 12.1 10*3/uL — ABNORMAL HIGH (ref 4.0–10.5)

## 2014-06-05 MED ORDER — FUROSEMIDE 10 MG/ML IJ SOLN
40.0000 mg | Freq: Every day | INTRAMUSCULAR | Status: DC
  Administered 2014-06-05 – 2014-06-06 (×2): 40 mg via INTRAVENOUS
  Filled 2014-06-05 (×3): qty 4

## 2014-06-05 NOTE — Progress Notes (Signed)
Patient ID: [name]  female  QPY:[id number]    DOB: 1930/05/06    DOA: 05/31/2014  PCP: Nyoka Cowden, MD   Brief history of present illness Patient is a 80 year old female with chronic anemia, chronic diastolic CHF, colovaginal fistula and diverticulosis presented with abdominal pain. She reported she has chronic abdominal pain which was getting worse for the past couple of weeks, nausea, vomiting but no severe weight loss. In the ED, CT scan of the abdomen and pelvis showed 6 cm apple core lesion involving the ED was cecal valve concerning for colon cancer. Patient was admitted for further workup.  Assessment/Plan: Principal Problem:   Colon tumor/ SBO: Postop day # 3, status post exploratory laparotomy, rt hemicolectomy, descending colostomy  - CT finding of 6 cm apple core lesion concerning for colon cancer - surgery following - Started on clears - PT recommended skilled nursing facility  Active Problems: Chronic diastolic CHF - 2-D echo in 2011 showed EF 60% with grade 1 diastolic dysfunction -I's and O's with 2.9 L positive, placed on IV Lasix, until euvolemic, continue to hold Aldactone  Anemia: Acute on chronic, microcytic iron deficiency anemia likely due to chronic blood loss and colon cancer - Transfused 2 units packed RBCs postoperatively - Hemoglobin stable, slightly down to 8.7, monitor closely  Leukocytosis: Improving, no fevers or chills   Generalized debility - We will need skilled nursing therapy and rehabilitation for PT OT  DVT Prophylaxis:heparin subcutaneous  Code Status:full code  Family Communication:   Disposition:  Consultants:  Surgery  Gastroenterology  Procedures: exploratory laparotomy, rt hemicolectomy, descending colostomy 11/19   Antibiotics: IV Cefotan   Subjective: Patient seen and examined, still feels nauseous, very weak and pain  Objective: Weight change:   Intake/Output Summary (Last 24 hours) at 06/05/14  0934 Last data filed at 06/05/14 0700  Gross per 24 hour  Intake  987.5 ml  Output   1450 ml  Net -462.5 ml   Blood pressure 124/93, pulse 95, temperature 98.5 F (36.9 C), temperature source Oral, resp. rate 27, height 5\' 3"  (1.6 m), weight 84.8 kg (186 lb 15.2 oz), SpO2 96 %.  Physical Exam: General: A x O, sitting up in chair CVS: S1-S2 clear Chest: Decreased breath sounds at the bases Abdomen: Soft, + bowel sounds, ostomy + Extremities: no c/c, trace edema    Lab Results: Basic Metabolic Panel:  Recent Labs Lab 06/04/14 0313 06/05/14 0255  NA 133* 132*  K 4.8 4.3  CL 99 95*  CO2 21 23  GLUCOSE 116* 106*  BUN 21 14  CREATININE 0.98 0.88  CALCIUM 8.3* 8.3*   Liver Function Tests:  Recent Labs Lab 05/31/14 1204  AST 29  ALT 12  ALKPHOS 68  BILITOT 0.2*  PROT 7.5  ALBUMIN 3.4*   No results for input(s): LIPASE, AMYLASE in the last 168 hours. No results for input(s): AMMONIA in the last 168 hours. CBC:  Recent Labs Lab 05/31/14 1204  06/04/14 0313 06/05/14 0255  WBC 7.5  < > 14.4* 12.1*  NEUTROABS 6.1  --   --   --   HGB 9.6*  < > 9.5* 8.7*  HCT 32.0*  < > 30.4* 27.6*  MCV 79.0  < > 77.9* 79.5  PLT 237  < > 205 192  < > = values in this interval not displayed. Cardiac Enzymes: No results for input(s): CKTOTAL, CKMB, CKMBINDEX, TROPONINI in the last 168 hours. BNP: Invalid input(s): POCBNP CBG:  Recent Labs  Lab 06/03/14 1132 06/03/14 1537 06/04/14 0237  GLUCAP 148* 134* 132*     Micro Results: Recent Results (from the past 240 hour(s))  MRSA PCR Screening     Status: None   Collection Time: 05/31/14  8:56 PM  Result Value Ref Range Status   MRSA by PCR NEGATIVE NEGATIVE Final    Comment:        The GeneXpert MRSA Assay (FDA approved for NASAL specimens only), is one component of a comprehensive MRSA colonization surveillance program. It is not intended to diagnose MRSA infection nor to guide or monitor treatment for MRSA  infections.     Studies/Results: Ct Abdomen Pelvis W Contrast  05/31/2014   ADDENDUM REPORT: 05/31/2014 16:27  ADDENDUM: Study discussed by telephone with Dr. Scot Jun On 05/31/2014 at 1625 hrs.   Electronically Signed   By: Lars Pinks M.D.   On: 05/31/2014 16:27   05/31/2014   CLINICAL DATA:  80 year old female with right side abdominal pain, severe in constant for 2 days. Diarrhea. Initial encounter.  EXAM: CT ABDOMEN AND PELVIS WITH CONTRAST  TECHNIQUE: Multidetector CT imaging of the abdomen and pelvis was performed using the standard protocol following bolus administration of intravenous contrast.  CONTRAST:  5mL OMNIPAQUE IOHEXOL 300 MG/ML  SOLN  COMPARISON:  04/28/2014 and earlier.  FINDINGS: Negative lung bases. Mild cardiomegaly. No pericardial or pleural effusion.  Stable visualized osseous structures.  Moderate volume of free fluid in the pelvis is new. Decompressed bladder. Negative rectum. Sigmoid diverticulosis re - identified, no definite adjacent mesenteric inflammation.  Decompressed left colon, transverse colon, at hepatic flexure.  Soft tissue mass like thickening at the ileocecal valve. The cecum is largely decompressed, but the small bowel is been diffusely dilated distally, with a gradual transition to more normal caliber proximal small bowel. The mass like soft tissue appears to affect both this cecum and terminal ileum, measuring up to 6 cm in length, by up to 4 cm in thickness. Pericecal lymph nodes are identified and appear stable small distal small bowel mesenteric nodes up to 8 mm short axis.  In addition to free fluid in the pelvis and a small bowel mesentery there is a small volume of fluid along the liver. No liver mass identified. Surgically absent gallbladder as before.  Small volume perisplenic fluid. Spleen, pancreas, adrenal glands, and kidneys are stable. Portal venous system is patent. Major arterial structures in the abdomen and pelvis are patent with aortoiliac  calcified atherosclerosis noted. No free air. Moderate volume gastric paraesophageal or hiatal hernia contains contrast today. This has not significantly changed.  IMPRESSION: Acute small bowel obstruction due to what appears to be a soft tissue mass (apple core lesion) at the ileocecal valve suspicious for intestinal adenocarcinoma. Up to 8 mm pericecal lymph nodes are stable. No liver or distant metastasis identified. Small volume free fluid in the abdomen and pelvis, favor reactive. No free air.  Electronically Signed: By: Lars Pinks M.D. On: 05/31/2014 16:16   Dg Abd 2 Views  06/02/2014   CLINICAL DATA:  Upper abdominal pain and constipation. Small bowel obstruction.  EXAM: ABDOMEN - 2 VIEW  COMPARISON:  05/31/2014  FINDINGS: Previously administered enteric contrast is now seen extending through the colon. There is persistent moderate gas distention of several loops of small bowel with index loop within the left mid hemiabdomen measuring 3.9 cm diameter. No pneumoperitoneum, pneumatosis or portal venous gas.  Post cholecystectomy.  Moderate scoliotic curvature of the thoracolumbar spine with associated moderate  to severe multilevel DDD, incompletely evaluated.  IMPRESSION: Findings suggestive of improved though persistent partial small bowel obstruction. Continued attention on follow-up is recommended.   Electronically Signed   By: Sandi Mariscal M.D.   On: 06/02/2014 09:11    Medications: Scheduled Meds: . antiseptic oral rinse  7 mL Mouth Rinse q12n4p  . cefoTEtan (CEFOTAN) IV  1 g Intravenous Q12H  . chlorhexidine  15 mL Mouth Rinse BID  . furosemide  40 mg Intravenous Daily  . heparin  5,000 Units Subcutaneous 3 times per day  . HYDROmorphone PCA 0.3 mg/mL   Intravenous 6 times per day  . metoprolol  50 mg Oral BID  . sertraline  50 mg Oral Daily  . sodium chloride  3 mL Intravenous Q12H      LOS: 5 days   RAI,RIPUDEEP M.D. [facility] 06/05/2014, 9:34 AM Pager: [phone]  If  7PM-7AM, please contact night-coverage [email or web address] Password [email or web address]

## 2014-06-05 NOTE — Progress Notes (Signed)
3 Days Post-Op  Subjective: Up in chair, some nausea  Objective: Vital signs in last 24 hours: Temp:  [98.3 F (36.8 C)-98.9 F (37.2 C)] 98.5 F (36.9 C) (11/22 0827) Pulse Rate:  [83-113] 95 (11/22 0336) Resp:  [19-34] 27 (11/22 0336) BP: (124-134)/(47-93) 124/93 mmHg (11/22 0336) SpO2:  [94 %-98 %] 96 % (11/22 0336) Last BM Date: 06/01/14  Intake/Output from previous day: 11/21 0701 - 11/22 0700 In: 987.5 [I.V.:937.5; IV Piggyback:50] Out: 4680 [Urine:1450] Intake/Output this shift:    General appearance: cooperative Resp: clear to auscultation bilaterally Cardio: reg with some ectopy GI: soft, active BS, stoma pink without much output  Lab Results:   Recent Labs  06/04/14 0313 06/05/14 0255  WBC 14.4* 12.1*  HGB 9.5* 8.7*  HCT 30.4* 27.6*  PLT 205 192   BMET  Recent Labs  06/04/14 0313 06/05/14 0255  NA 133* 132*  K 4.8 4.3  CL 99 95*  CO2 21 23  GLUCOSE 116* 106*  BUN 21 14  CREATININE 0.98 0.88  CALCIUM 8.3* 8.3*   PT/INR No results for input(s): LABPROT, INR in the last 72 hours. ABG No results for input(s): PHART, HCO3 in the last 72 hours.  Invalid input(s): PCO2, PO2  Studies/Results: No results found.  Anti-infectives: Anti-infectives    Start     Dose/Rate Route Frequency Ordered Stop   06/02/14 2200  cefoTEtan (CEFOTAN) 1 g in dextrose 5 % 50 mL IVPB     1 g100 mL/hr over 30 Minutes Intravenous Every 12 hours 06/02/14 1836     06/02/14 0600  [MAR Hold]  cefoTEtan (CEFOTAN) 2 g in dextrose 5 % 50 mL IVPB     (MAR Hold since 06/02/14 0935)   2 g100 mL/hr over 30 Minutes Intravenous On call to O.R. 06/01/14 1519 06/02/14 1047      Assessment/Plan: S/P R colectomy, sigmoid colectomy/colostomy Clears until bowel function ABL + chronic Anemia - drifted a bit, F/U OK to go to floor from our standpoint I spoke with her son  LOS: 5 days    Lanasia Porras E 06/05/2014

## 2014-06-06 LAB — GLUCOSE, CAPILLARY
Glucose-Capillary: 129 mg/dL — ABNORMAL HIGH (ref 70–99)
Glucose-Capillary: 117 mg/dL — ABNORMAL HIGH (ref 70–99)

## 2014-06-06 LAB — CBC
HCT: 29.7 % — ABNORMAL LOW (ref 36.0–46.0)
Hemoglobin: 9.6 g/dL — ABNORMAL LOW (ref 12.0–15.0)
MCH: 25.3 pg — ABNORMAL LOW (ref 26.0–34.0)
MCHC: 32.3 g/dL (ref 30.0–36.0)
MCV: 78.2 fL (ref 78.0–100.0)
Platelets: 229 10*3/uL (ref 150–400)
RBC: 3.8 MIL/uL — ABNORMAL LOW (ref 3.87–5.11)
RDW: 22.6 % — ABNORMAL HIGH (ref 11.5–15.5)
WBC: 11.8 10*3/uL — ABNORMAL HIGH (ref 4.0–10.5)

## 2014-06-06 LAB — TYPE AND SCREEN
ABO/RH(D): A POS
Antibody Screen: NEGATIVE
Unit division: 0
Unit division: 0

## 2014-06-06 LAB — BASIC METABOLIC PANEL
Anion gap: 16 — ABNORMAL HIGH (ref 5–15)
BUN: 17 mg/dL (ref 6–23)
CO2: 26 mEq/L (ref 19–32)
Calcium: 8.9 mg/dL (ref 8.4–10.5)
Chloride: 92 mEq/L — ABNORMAL LOW (ref 96–112)
Creatinine, Ser: 1.03 mg/dL (ref 0.50–1.10)
GFR calc Af Amer: 57 mL/min — ABNORMAL LOW (ref >90)
GFR calc non Af Amer: 49 mL/min — ABNORMAL LOW (ref >90)
Glucose, Bld: 106 mg/dL — ABNORMAL HIGH (ref 70–99)
Potassium: 4.2 mEq/L (ref 3.7–5.3)
Sodium: 134 mEq/L — ABNORMAL LOW (ref 137–147)

## 2014-06-06 MED ORDER — CIPROFLOXACIN IN D5W 400 MG/200ML IV SOLN
400.0000 mg | Freq: Two times a day (BID) | INTRAVENOUS | Status: DC
  Administered 2014-06-06 – 2014-06-08 (×6): 400 mg via INTRAVENOUS
  Filled 2014-06-06 (×7): qty 200

## 2014-06-06 MED ORDER — TRAZODONE HCL 50 MG PO TABS
50.0000 mg | ORAL_TABLET | Freq: Every day | ORAL | Status: DC
  Administered 2014-06-06: 50 mg via ORAL
  Filled 2014-06-06 (×2): qty 1

## 2014-06-06 MED ORDER — MORPHINE SULFATE 2 MG/ML IJ SOLN
2.0000 mg | INTRAMUSCULAR | Status: DC | PRN
  Administered 2014-06-06 – 2014-06-08 (×8): 2 mg via INTRAVENOUS
  Filled 2014-06-06 (×8): qty 1

## 2014-06-06 MED ORDER — PRO-STAT SUGAR FREE PO LIQD
30.0000 mL | Freq: Three times a day (TID) | ORAL | Status: DC
  Administered 2014-06-08 – 2014-06-10 (×6): 30 mL via ORAL
  Filled 2014-06-06 (×12): qty 30

## 2014-06-06 MED ORDER — METRONIDAZOLE IN NACL 5-0.79 MG/ML-% IV SOLN
500.0000 mg | Freq: Three times a day (TID) | INTRAVENOUS | Status: DC
  Administered 2014-06-06 – 2014-06-09 (×9): 500 mg via INTRAVENOUS
  Filled 2014-06-06 (×11): qty 100

## 2014-06-06 MED ORDER — VITAMIN D3 25 MCG (1000 UNIT) PO TABS
1000.0000 [IU] | ORAL_TABLET | Freq: Every day | ORAL | Status: DC
  Administered 2014-06-06 – 2014-06-10 (×3): 1000 [IU] via ORAL
  Filled 2014-06-06 (×5): qty 1

## 2014-06-06 MED ORDER — DEXTROSE-NACL 5-0.9 % IV SOLN
INTRAVENOUS | Status: DC
  Administered 2014-06-06 – 2014-06-08 (×3): via INTRAVENOUS

## 2014-06-06 NOTE — Progress Notes (Signed)
NGT insertion attempted x2 with Remo Lipps, RN.  Patient extremely non-compliant.  Stated that "she couldn't breathe" and refused to swallow to advance tube when instructed to do so.  Patient also trying to pull tube out as we tried to advance.  Began screaming that "she was going to die".  Dr. Hulen Skains notified.  Zofran administered for nausea and patient kept in upright position with emesis basin close by.   Patient also became gradually more confused during the afternoon, refusing to wear O2/CO2 monitor for PCA.  Dr. Hulen Skains notified again and gave orders to d/c PCA.

## 2014-06-06 NOTE — Clinical Social Work Placement (Addendum)
Clinical Social Work Department CLINICAL SOCIAL WORK PLACEMENT NOTE 06/06/2014  Patient:  [name], [name]  Account Number:  [id number] [place] date:  05/31/2014  Clinical Social Worker:  Domenica Reamer, CLINICAL SOCIAL WORKER  Date/time:  06/06/2014 12:52 PM  Clinical Social Work is seeking post-discharge placement for this patient at the following level of care:   SKILLED NURSING   (*CSW will update this form in Epic as items are completed)   06/06/2014  Patient/family provided with [place] Department of Clinical Social Work's list of facilities offering this level of care within the geographic area requested by the patient (or if unable, by the patient's family).  06/06/2014  Patient/family informed of their freedom to choose among providers that offer the needed level of care, that participate in Medicare, Medicaid or managed care program needed by the patient, have an available bed and are willing to accept the patient.  06/06/2014  Patient/family informed of [facility]' ownership interest in [facility], as well as of the fact that they are under no obligation to receive care at this facility.  PASARR submitted to EDS on 06/06/2014 PASARR number received on 06/06/2014  FL2 transmitted to all facilities in geographic area requested by pt/family on  06/06/2014 FL2 transmitted to all facilities within larger geographic area on   Patient informed that his/her managed care company has contracts with or will negotiate with  certain facilities, including the following:     Patient/family informed of bed offers received:  06/10/2014 Patient chooses bed at [facility]  Physician recommends and patient chooses bed at    Patient to be transferred to [facility] on 06/10/2014   Patient to be transferred to facility by daughter [name]  Patient and family notified of transfer on 06/10/2014 Name of family member notified:  Pt and pt's daughter [name]    The following physician  request were entered in Epic:   Additional Comments: Domenica Reamer, [place] Social Worker [phone]

## 2014-06-06 NOTE — Progress Notes (Signed)
Patient ID: [name]  female  QQI:[id number]    DOB: 1929-11-03    DOA: 05/31/2014  PCP: Nyoka Cowden, MD   Brief history of present illness Patient is a 80 year old female with chronic anemia, chronic diastolic CHF, colovaginal fistula and diverticulosis presented with abdominal pain. She reported she has chronic abdominal pain which was getting worse for the past couple of weeks, nausea, vomiting but no severe weight loss. In the ED, CT scan of the abdomen and pelvis showed 6 cm apple core lesion involving the ED was cecal valve concerning for colon cancer. Patient was admitted for further workup.  Assessment/Plan: Principal Problem:   Colon tumor/ SBO: Postop day # 4, status post exploratory laparotomy, rt hemicolectomy, descending colostomy  - CT finding of 6 cm apple core lesion concerning for colon cancer - surgery following, on clears - PT recommended skilled nursing facility - wean off dilaudid PCA, patient getting delirious at night - will check with surgery if oncology consult is to be called at this time, Biopsy report still pending   Active Problems: Chronic diastolic CHF - 2-D echo in 2011 showed EF 60% with grade 1 diastolic dysfunction -I's and O's with 3.0 L positive continue IV Lasix for diuresis  - DC IV fluids  Anemia: Acute on chronic, microcytic iron deficiency anemia likely due to chronic blood loss and colon cancer - Transfused 2 units packed RBCs postoperatively - Hemoglobin stable  Leukocytosis: Improving, no fevers or chills   Generalized debility - We will need skilled nursing therapy and rehabilitation per PT OT  DVT Prophylaxis:heparin subcutaneous  Code Status:full code  Family Communication: Discussed with daughter at the bedside  Disposition: Transferred to surgical floor today  Consultants:  Surgery  Gastroenterology  Procedures: exploratory laparotomy, rt hemicolectomy, descending colostomy 11/19   Antibiotics: IV  Cefotan   Subjective: Patient seen and examined, alert and awake, currently oriented, overnight was delirious and confused, pain controlled  Objective: Weight change:   Intake/Output Summary (Last 24 hours) at 06/06/14 1005 Last data filed at 06/06/14 0700  Gross per 24 hour  Intake   1025 ml  Output    900 ml  Net    125 ml   Blood pressure 124/61, pulse 108, temperature 98.8 F (37.1 C), temperature source Oral, resp. rate 20, height 5\' 3"  (1.6 m), weight 84.8 kg (186 lb 15.2 oz), SpO2 97 %.  Physical Exam: General: A x O, sitting up in chair CVS: S1-S2 clear Chest: Decreased breath sounds at the bases Abdomen: Soft, + bowel sounds, ostomy + Extremities: no c/c, trace edema    Lab Results: Basic Metabolic Panel:  Recent Labs Lab 06/05/14 0255 06/06/14 0335  NA 132* 134*  K 4.3 4.2  CL 95* 92*  CO2 23 26  GLUCOSE 106* 106*  BUN 14 17  CREATININE 0.88 1.03  CALCIUM 8.3* 8.9   Liver Function Tests:  Recent Labs Lab 05/31/14 1204  AST 29  ALT 12  ALKPHOS 68  BILITOT 0.2*  PROT 7.5  ALBUMIN 3.4*   No results for input(s): LIPASE, AMYLASE in the last 168 hours. No results for input(s): AMMONIA in the last 168 hours. CBC:  Recent Labs Lab 05/31/14 1204  06/05/14 0255 06/06/14 0335  WBC 7.5  < > 12.1* 11.8*  NEUTROABS 6.1  --   --   --   HGB 9.6*  < > 8.7* 9.6*  HCT 32.0*  < > 27.6* 29.7*  MCV 79.0  < > 79.5  78.2  PLT 237  < > 192 229  < > = values in this interval not displayed. Cardiac Enzymes: No results for input(s): CKTOTAL, CKMB, CKMBINDEX, TROPONINI in the last 168 hours. BNP: Invalid input(s): POCBNP CBG:  Recent Labs Lab 06/03/14 1132 06/03/14 1537 06/04/14 0237  GLUCAP 148* 134* 132*     Micro Results: Recent Results (from the past 240 hour(s))  MRSA PCR Screening     Status: None   Collection Time: 05/31/14  8:56 PM  Result Value Ref Range Status   MRSA by PCR NEGATIVE NEGATIVE Final    Comment:        The GeneXpert  MRSA Assay (FDA approved for NASAL specimens only), is one component of a comprehensive MRSA colonization surveillance program. It is not intended to diagnose MRSA infection nor to guide or monitor treatment for MRSA infections.     Studies/Results: Ct Abdomen Pelvis W Contrast  05/31/2014   ADDENDUM REPORT: 05/31/2014 16:27  ADDENDUM: Study discussed by telephone with Dr. Scot Jun On 05/31/2014 at 1625 hrs.   Electronically Signed   By: Lars Pinks M.D.   On: 05/31/2014 16:27   05/31/2014   CLINICAL DATA:  80 year old female with right side abdominal pain, severe in constant for 2 days. Diarrhea. Initial encounter.  EXAM: CT ABDOMEN AND PELVIS WITH CONTRAST  TECHNIQUE: Multidetector CT imaging of the abdomen and pelvis was performed using the standard protocol following bolus administration of intravenous contrast.  CONTRAST:  28mL OMNIPAQUE IOHEXOL 300 MG/ML  SOLN  COMPARISON:  04/28/2014 and earlier.  FINDINGS: Negative lung bases. Mild cardiomegaly. No pericardial or pleural effusion.  Stable visualized osseous structures.  Moderate volume of free fluid in the pelvis is new. Decompressed bladder. Negative rectum. Sigmoid diverticulosis re - identified, no definite adjacent mesenteric inflammation.  Decompressed left colon, transverse colon, at hepatic flexure.  Soft tissue mass like thickening at the ileocecal valve. The cecum is largely decompressed, but the small bowel is been diffusely dilated distally, with a gradual transition to more normal caliber proximal small bowel. The mass like soft tissue appears to affect both this cecum and terminal ileum, measuring up to 6 cm in length, by up to 4 cm in thickness. Pericecal lymph nodes are identified and appear stable small distal small bowel mesenteric nodes up to 8 mm short axis.  In addition to free fluid in the pelvis and a small bowel mesentery there is a small volume of fluid along the liver. No liver mass identified. Surgically absent  gallbladder as before.  Small volume perisplenic fluid. Spleen, pancreas, adrenal glands, and kidneys are stable. Portal venous system is patent. Major arterial structures in the abdomen and pelvis are patent with aortoiliac calcified atherosclerosis noted. No free air. Moderate volume gastric paraesophageal or hiatal hernia contains contrast today. This has not significantly changed.  IMPRESSION: Acute small bowel obstruction due to what appears to be a soft tissue mass (apple core lesion) at the ileocecal valve suspicious for intestinal adenocarcinoma. Up to 8 mm pericecal lymph nodes are stable. No liver or distant metastasis identified. Small volume free fluid in the abdomen and pelvis, favor reactive. No free air.  Electronically Signed: By: Lars Pinks M.D. On: 05/31/2014 16:16   Dg Abd 2 Views  06/02/2014   CLINICAL DATA:  Upper abdominal pain and constipation. Small bowel obstruction.  EXAM: ABDOMEN - 2 VIEW  COMPARISON:  05/31/2014  FINDINGS: Previously administered enteric contrast is now seen extending through the colon. There is persistent  moderate gas distention of several loops of small bowel with index loop within the left mid hemiabdomen measuring 3.9 cm diameter. No pneumoperitoneum, pneumatosis or portal venous gas.  Post cholecystectomy.  Moderate scoliotic curvature of the thoracolumbar spine with associated moderate to severe multilevel DDD, incompletely evaluated.  IMPRESSION: Findings suggestive of improved though persistent partial small bowel obstruction. Continued attention on follow-up is recommended.   Electronically Signed   By: Sandi Mariscal M.D.   On: 06/02/2014 09:11    Medications: Scheduled Meds: . antiseptic oral rinse  7 mL Mouth Rinse q12n4p  . cefoTEtan (CEFOTAN) IV  1 g Intravenous Q12H  . chlorhexidine  15 mL Mouth Rinse BID  . cholecalciferol  1,000 Units Oral Daily  . furosemide  40 mg Intravenous Daily  . heparin  5,000 Units Subcutaneous 3 times per day  .  HYDROmorphone PCA 0.3 mg/mL   Intravenous 6 times per day  . metoprolol  50 mg Oral BID  . sertraline  50 mg Oral Daily  . sodium chloride  3 mL Intravenous Q12H  . traZODone  50 mg Oral QHS      LOS: 6 days   Gerron Guidotti M.D. [facility] 06/06/2014, 10:05 AM Pager: [phone]  If 7PM-7AM, please contact night-coverage [email or web address] Password [email or web address]

## 2014-06-06 NOTE — Progress Notes (Signed)
NUTRITION FOLLOW UP  Intervention:   -30 ml Prostat TID -RD to follow for diet advancement -Consider initiation of TPN if unable to advance diet beyond clears  Nutrition Dx:   Inadequate oral intake related to decreased appetite as evidenced by pt report; ongoing  Goal:   Pt to meet >/= 90% of their estimated nutrition needs; goal not met  Monitor:   PO & supplemental intake, weight, labs, I/O's  Assessment:   80 year old Female with chronic anemia, chronic diastolic CHF, colovaginal fistula and diverticulosis presented with abdominal pain.   In the ED, CT scan of the abdomen and pelvis showed 6 cm apple core lesion involving the ED was cecal valve concerning for colon cancer; admitted for further workup.  POD 4, s/p ex lap with right colectomy (colon mass) and diverting left sided colostomy for CV fistula.  Pt unavailable at time of visit. Noted pt with continued nausea. Intake continues to be poor; PO: 25%.  Noted Resource Breeze supplement was d/c due to pt's refusal. She has been NPO/clear liquids x 6 days. Will order protein modular in attempt to supplement diet until advancement.  Discharge plan is SNF at discharge. CSW following.  Labs reviewed. Na: 134, Cl: 92, Glucose: 106, CBGS: 132-134.   Height: Ht Readings from Last 1 Encounters:  06/02/14 $RemoveB'5\' 3"'ZPWfwVrN$  (1.6 m)    Weight Status:   Wt Readings from Last 1 Encounters:  06/02/14 186 lb 15.2 oz (84.8 kg)   05/31/14 170 lb (77.111 kg)    Re-estimated needs:  Kcal: 1800-2000 Protein: 91-101 Fluid: 1.8-2.0 L  Skin: closed abdominal incision, ecchymosis, LUQ colostomy  Diet Order: Diet clear liquid   Intake/Output Summary (Last 24 hours) at 06/06/14 1147 Last data filed at 06/06/14 0700  Gross per 24 hour  Intake   1025 ml  Output    900 ml  Net    125 ml    Last BM: 06/01/14   Labs:   Recent Labs Lab 06/04/14 0313 06/05/14 0255 06/06/14 0335  NA 133* 132* 134*  K 4.8 4.3 4.2  CL 99 95* 92*  CO2  $Re'21 23 26  'vzL$ BUN $R'21 14 17  'ix$ CREATININE 0.98 0.88 1.03  CALCIUM 8.3* 8.3* 8.9  GLUCOSE 116* 106* 106*    CBG (last 3)   Recent Labs  06/03/14 1537 06/04/14 0237  GLUCAP 134* 132*    Scheduled Meds: . antiseptic oral rinse  7 mL Mouth Rinse q12n4p  . chlorhexidine  15 mL Mouth Rinse BID  . cholecalciferol  1,000 Units Oral Daily  . ciprofloxacin  400 mg Intravenous Q12H  . furosemide  40 mg Intravenous Daily  . heparin  5,000 Units Subcutaneous 3 times per day  . HYDROmorphone PCA 0.3 mg/mL   Intravenous 6 times per day  . metoprolol  50 mg Oral BID  . metronidazole  500 mg Intravenous Q8H  . sertraline  50 mg Oral Daily  . sodium chloride  3 mL Intravenous Q12H  . traZODone  50 mg Oral QHS    Continuous Infusions:   Zamar Odwyer A. Jimmye Norman, RD, LDN Pager: [phone] After hours Pager: [phone]

## 2014-06-06 NOTE — Clinical Social Work Psychosocial (Signed)
Clinical Social Work Department BRIEF PSYCHOSOCIAL ASSESSMENT 06/06/2014  Patient:  [name], [name]     Account Number:  [id number]     Admit date:  05/31/2014  Clinical Social Worker:  Domenica Reamer, CLINICAL SOCIAL WORKER  Date/Time:  06/06/2014 10:30 AM  Referred by:  Physician  Date Referred:  06/04/2014 Referred for  SNF Placement   Other Referral:   Interview type:  Patient Other interview type:   Patients daughter, [name], was also at bedside and able to provide information concerning patients    PSYCHOSOCIAL DATA Living Status:  ALONE Admitted from facility:   Level of care:   Primary support name:  [name] Primary support relationship to patient:  CHILD, ADULT Degree of support available:   Patient reports high level of support    CURRENT CONCERNS Current Concerns  Post-Acute Placement   Other Concerns:    SOCIAL WORK ASSESSMENT / PLAN CSW spoke with patient and patients daughter concerning SNF placement.  Patient reports being at [facility] in the past and would not like to go there again- patient would like [facility] at this time. Patient was living alone prior to admission and would like to stay at rehab only as long as it is considered necessary. CSW will continue to follow.   Assessment/plan status:  Psychosocial Support/Ongoing Assessment of Needs Other assessment/ plan:   FL2 update   Information/referral to community resources:   [facility]    PATIENT'S/FAMILY'S RESPONSE TO PLAN OF CARE: Patient and patients daughter are agreeable to SNF placement on a temporary basis but are hoping it will only be for a week or so.       Domenica Reamer, [place] Social Worker [phone]

## 2014-06-06 NOTE — Progress Notes (Signed)
Physical Therapy Treatment Patient Details Name: [name] MRN: [id number] DOB: [birth year] Today's Date: 06/06/2014    History of Present Illness      PT Comments    Pt progressing with ambulation.  Pt still requiring +2 assist for mobility.  Continue to recommend SNF at d/c for further therapy.  Follow Up Recommendations  SNF;Supervision/Assistance - 24 hour     Equipment Recommendations  Rolling walker with 5" wheels    Recommendations for Other Services       Precautions / Restrictions Precautions Precautions: Fall Precaution Comments: pt with ostomy  Required Braces or Orthoses: Other Brace/Splint Other Brace/Splint: abdominal binder  Restrictions Weight Bearing Restrictions: No    Mobility  Bed Mobility Overal bed mobility: Needs Assistance Bed Mobility: Supine to Sit     Supine to sit: Mod assist     General bed mobility comments: verbal cues for sequencing  Transfers   Equipment used: Rolling walker (2 wheeled) Transfers: Sit to/from [facility] Sit to Stand: Min assist Stand pivot transfers: Mod assist       General transfer comment: cues for hand placement, assist to power up  Ambulation/Gait Ambulation/Gait assistance: +2 physical assistance;Min assist Ambulation Distance (Feet): 20 Feet Assistive device: Rolling walker (2 wheeled) Gait Pattern/deviations: Step-through pattern;Decreased stride length Gait velocity: decreased   General Gait Details: Pt fatigues quickly.  Knee buckling during gait x 1 requiring +2 mod assist to regain stance   Stairs            Wheelchair Mobility    Modified Rankin (Stroke Patients Only)       Balance   Sitting-balance support: Feet supported;No upper extremity supported Sitting balance-Leahy Scale: Fair Sitting balance - Comments: guarded    Standing balance support: Bilateral upper extremity supported;During functional activity Standing balance-Leahy Scale: Poor                      Cognition Arousal/Alertness: Awake/alert Behavior During Therapy: WFL for tasks assessed/performed Overall Cognitive Status: Within Functional Limits for tasks assessed                      Exercises      General Comments        Pertinent Vitals/Pain Pain Assessment: 0-10 Pain Score: 7  Pain Location: Abdomen Pain Intervention(s): Repositioned;Limited activity within patient's tolerance;Monitored during session    Home Living                      Prior Function            PT Goals (current goals can now be found in the care plan section) Progress towards PT goals: Progressing toward goals    Frequency  Min 3X/week    PT Plan Current plan remains appropriate    Co-evaluation             End of Session Equipment Utilized During Treatment: Gait belt Activity Tolerance: Patient limited by fatigue;Patient limited by pain Patient left: in bed;with family/visitor present;with call bell/phone within reach;with bed alarm set     Time: 1037-1100 PT Time Calculation (min) (ACUTE ONLY): 23 min  Charges:  $Gait Training: 23-37 mins                    G Codes:      Lorriane Shire 06/06/2014, 11:08 AM

## 2014-06-06 NOTE — Progress Notes (Signed)
Report given. Pt transferred. IV lost during transitioning from wheelchair to bed. IV team paged and order placed. RN made aware.

## 2014-06-06 NOTE — Plan of Care (Signed)
Problem: Phase I Progression Outcomes Goal: Pain controlled with appropriate interventions Outcome: Completed/Met Date Met:  06/06/14 Goal: OOB as tolerated unless otherwise ordered Outcome: Completed/Met Date Met:  06/06/14 Goal: Incision/dressings dry and intact Outcome: Completed/Met Date Met:  06/06/14 Goal: Voiding-avoid urinary catheter unless indicated Outcome: Completed/Met Date Met:  06/06/14

## 2014-06-06 NOTE — Progress Notes (Signed)
Patient ID: [name], female   DOB: 15-Sallis-1931, 80 y.o.   MRN: [id number] 4 Days Post-Op  Subjective: Pt feels full.  Not eating much liquids.  Some nausea this am.  Just finished working with PT  Objective: Vital signs in last 24 hours: Temp:  [98.4 F (36.9 C)-98.9 F (37.2 C)] 98.8 F (37.1 C) (11/23 0943) Pulse Rate:  [90-111] 108 (11/23 0943) Resp:  [18-23] 20 (11/23 0943) BP: (107-159)/(55-90) 124/61 mmHg (11/23 0943) SpO2:  [93 %-99 %] 97 % (11/23 0943) Last BM Date: 06/01/14  Intake/Output from previous day: 11/22 0701 - 11/23 0700 In: 1025 [I.V.:975; IV Piggyback:50] Out: 900 [Urine:900] Intake/Output this shift:    PE: Abd: soft, but distended, hypoactive BS, incisional honeycomb dressing with green drainage and pseudomonas smell.  This was removed.  No evidence of purulent drainage or need to remove staples.  Ostomy pouch will need to be changed.  Minimal output, but stoma viable  Lab Results:   Recent Labs  06/05/14 0255 06/06/14 0335  WBC 12.1* 11.8*  HGB 8.7* 9.6*  HCT 27.6* 29.7*  PLT 192 229   BMET  Recent Labs  06/05/14 0255 06/06/14 0335  NA 132* 134*  K 4.3 4.2  CL 95* 92*  CO2 23 26  GLUCOSE 106* 106*  BUN 14 17  CREATININE 0.88 1.03  CALCIUM 8.3* 8.9   PT/INR No results for input(s): LABPROT, INR in the last 72 hours. CMP     Component Value Date/Time   NA 134* 06/06/2014 0335   K 4.2 06/06/2014 0335   CL 92* 06/06/2014 0335   CO2 26 06/06/2014 0335   GLUCOSE 106* 06/06/2014 0335   BUN 17 06/06/2014 0335   CREATININE 1.03 06/06/2014 0335   CALCIUM 8.9 06/06/2014 0335   PROT 7.5 05/31/2014 1204   ALBUMIN 3.4* 05/31/2014 1204   AST 29 05/31/2014 1204   ALT 12 05/31/2014 1204   ALKPHOS 68 05/31/2014 1204   BILITOT 0.2* 05/31/2014 1204   GFRNONAA 49* 06/06/2014 0335   GFRAA 57* 06/06/2014 0335   Lipase  No results found for: LIPASE     Studies/Results: No results found.  Anti-infectives: Anti-infectives    Start      Dose/Rate Route Frequency Ordered Stop   06/06/14 1115  ciprofloxacin (CIPRO) IVPB 400 mg     400 mg200 mL/hr over 60 Minutes Intravenous Every 12 hours 06/06/14 1114     06/06/14 1115  metroNIDAZOLE (FLAGYL) IVPB 500 mg     500 mg100 mL/hr over 60 Minutes Intravenous Every 8 hours 06/06/14 1114     06/02/14 2200  cefoTEtan (CEFOTAN) 1 g in dextrose 5 % 50 mL IVPB  Status:  Discontinued     1 g100 mL/hr over 30 Minutes Intravenous Every 12 hours 06/02/14 1836 06/06/14 1114   06/02/14 0600  [MAR Hold]  cefoTEtan (CEFOTAN) 2 g in dextrose 5 % 50 mL IVPB     (MAR Hold since 06/02/14 0935)   2 g100 mL/hr over 30 Minutes Intravenous On call to O.R. 06/01/14 1519 06/02/14 1047       Assessment/Plan  1. POD 4, s/p ex lap with right colectomy (colon mass) and diverting left sided colostomy for CV fistula  Plan: 1. Pathology still pending 2. DC cefotetan and change to Cipro and Flagyl to cover for surgery and to give better pseudomonas coverage for incision 3. Cont to mobilize 4. Stay on clears, hypoactive BS and distended with some nausea.   LOS: 6 days  Aila Terra E 06/06/2014, 11:14 AM Pager: [phone]

## 2014-06-06 NOTE — Consult Note (Addendum)
WOC ostomy follow up CCS following for assessment and plan of care to abd wound. Stoma type/location: colostomy to left lower quad slightly above skin level, 1 1/4 inches Stomal assessment/size:  1 1/4 inches Peristomal assessment: Intact skin surrounding, maceration to 2 cm surrounding stoma from current pouch which has leaked Output No stool or flatus, small amt brown liquid in pouch Ostomy pouching: 1pc. Education provided:  Demonstrated pouch change using one piece pouch and barrier ring to maintain seal.  Pt is able to open and close velcro to empty.  She asked appropriate questions.  No family members at bedside during teaching session.  She could benefit from home health assistance after discharge.   Educational materials at bedside and supplies ordered to room for staff nurse use. Enrolled patient in [place] Start Discharge program: No Whitney Girt MSN, [place], [name], [place]

## 2014-06-06 NOTE — Progress Notes (Signed)
Patient got up to use BSC and became nauseated.  Proceeded to vomit 500cc green/bile emesis with no relief. PA, Claiborne Billings was notified and gave new orders.  Dr. Rosendo Gros also came to see patient.  Will attempt to place NGT and keep patient NPO.

## 2014-06-07 ENCOUNTER — Inpatient Hospital Stay (HOSPITAL_COMMUNITY): Payer: Medicare HMO

## 2014-06-07 ENCOUNTER — Encounter: Payer: Self-pay | Admitting: Neurology

## 2014-06-07 DIAGNOSIS — D638 Anemia in other chronic diseases classified elsewhere: Secondary | ICD-10-CM

## 2014-06-07 DIAGNOSIS — D72829 Elevated white blood cell count, unspecified: Secondary | ICD-10-CM

## 2014-06-07 DIAGNOSIS — D509 Iron deficiency anemia, unspecified: Secondary | ICD-10-CM

## 2014-06-07 DIAGNOSIS — C186 Malignant neoplasm of descending colon: Secondary | ICD-10-CM

## 2014-06-07 DIAGNOSIS — I5030 Unspecified diastolic (congestive) heart failure: Secondary | ICD-10-CM

## 2014-06-07 LAB — BASIC METABOLIC PANEL
Anion gap: 16 — ABNORMAL HIGH (ref 5–15)
BUN: 19 mg/dL (ref 6–23)
CO2: 24 mEq/L (ref 19–32)
Calcium: 8.2 mg/dL — ABNORMAL LOW (ref 8.4–10.5)
Chloride: 94 mEq/L — ABNORMAL LOW (ref 96–112)
Creatinine, Ser: 1.32 mg/dL — ABNORMAL HIGH (ref 0.50–1.10)
GFR calc Af Amer: 42 mL/min — ABNORMAL LOW (ref >90)
GFR calc non Af Amer: 36 mL/min — ABNORMAL LOW (ref >90)
Glucose, Bld: 93 mg/dL (ref 70–99)
Potassium: 3.7 mEq/L (ref 3.7–5.3)
Sodium: 134 mEq/L — ABNORMAL LOW (ref 137–147)

## 2014-06-07 LAB — CBC
HCT: 24.9 % — ABNORMAL LOW (ref 36.0–46.0)
HCT: 30.9 % — ABNORMAL LOW (ref 36.0–46.0)
Hemoglobin: 7.9 g/dL — ABNORMAL LOW (ref 12.0–15.0)
Hemoglobin: 10 g/dL — ABNORMAL LOW (ref 12.0–15.0)
MCH: 24.8 pg — ABNORMAL LOW (ref 26.0–34.0)
MCH: 25.1 pg — ABNORMAL LOW (ref 26.0–34.0)
MCHC: 31.7 g/dL (ref 30.0–36.0)
MCHC: 32.4 g/dL (ref 30.0–36.0)
MCV: 78.1 fL (ref 78.0–100.0)
MCV: 77.4 fL — ABNORMAL LOW (ref 78.0–100.0)
Platelets: 190 10*3/uL (ref 150–400)
Platelets: 201 10*3/uL (ref 150–400)
RBC: 3.19 MIL/uL — ABNORMAL LOW (ref 3.87–5.11)
RBC: 3.99 MIL/uL (ref 3.87–5.11)
RDW: 22.8 % — ABNORMAL HIGH (ref 11.5–15.5)
RDW: 20.8 % — ABNORMAL HIGH (ref 11.5–15.5)
WBC: 8.3 10*3/uL (ref 4.0–10.5)
WBC: 9 10*3/uL (ref 4.0–10.5)

## 2014-06-07 LAB — PREPARE RBC (CROSSMATCH)

## 2014-06-07 LAB — GLUCOSE, CAPILLARY: Glucose-Capillary: 127 mg/dL — ABNORMAL HIGH (ref 70–99)

## 2014-06-07 MED ORDER — LORAZEPAM 2 MG/ML IJ SOLN
0.5000 mg | Freq: Once | INTRAMUSCULAR | Status: AC
  Administered 2014-06-07: 0.5 mg via INTRAVENOUS
  Filled 2014-06-07: qty 1

## 2014-06-07 MED ORDER — SODIUM CHLORIDE 0.9 % IV SOLN
Freq: Once | INTRAVENOUS | Status: AC
  Administered 2014-06-07: 12:00:00 via INTRAVENOUS

## 2014-06-07 NOTE — Progress Notes (Signed)
[facility] Progress Note  5 Days Post-Op  Subjective: Pt still c/o intermittent nausea especially with mobilization.  No more vomiting.  Flatus and BM in ostomy bag.  NPO.  Not been OOB much.  Working with PT - recommending SNF.  OT pending.  Objective: Vital signs in last 24 hours: Temp:  [98.2 F (36.8 C)-98.8 F (37.1 C)] 98.6 F (37 C) (11/24 0547) Pulse Rate:  [73-108] 73 (11/24 0547) Resp:  [18-20] 18 (11/24 0547) BP: (104-124)/(50-66) 118/60 mmHg (11/24 0547) SpO2:  [94 %-98 %] 98 % (11/24 0547) Last BM Date: 06/06/14  Intake/Output from previous day: 11/23 0701 - 11/24 0700 In: 1040 [P.O.:240; I.V.:500; IV Piggyback:300] Out: 2050 [Urine:800; Emesis/NG output:500; Stool:750] Intake/Output this shift:    PE: Gen:  Alert, NAD, pleasant Abd: Soft, NT/ND, +BS, no HSM, incisions C/D/I with staples in place, ostomy pink with stool output and flatus in bag (not filled much)   Lab Results:   Recent Labs  06/06/14 0335 06/07/14 0500  WBC 11.8* 8.3  HGB 9.6* 7.9*  HCT 29.7* 24.9*  PLT 229 190   BMET  Recent Labs  06/06/14 0335 06/07/14 0500  NA 134* 134*  K 4.2 3.7  CL 92* 94*  CO2 26 24  GLUCOSE 106* 93  BUN 17 19  CREATININE 1.03 1.32*  CALCIUM 8.9 8.2*   PT/INR No results for input(s): LABPROT, INR in the last 72 hours. CMP     Component Value Date/Time   NA 134* 06/07/2014 0500   K 3.7 06/07/2014 0500   CL 94* 06/07/2014 0500   CO2 24 06/07/2014 0500   GLUCOSE 93 06/07/2014 0500   BUN 19 06/07/2014 0500   CREATININE 1.32* 06/07/2014 0500   CALCIUM 8.2* 06/07/2014 0500   PROT 7.5 05/31/2014 1204   ALBUMIN 3.4* 05/31/2014 1204   AST 29 05/31/2014 1204   ALT 12 05/31/2014 1204   ALKPHOS 68 05/31/2014 1204   BILITOT 0.2* 05/31/2014 1204   GFRNONAA 36* 06/07/2014 0500   GFRAA 42* 06/07/2014 0500   Lipase  No results found for: LIPASE     Studies/Results: No results found.  Anti-infectives: Anti-infectives    Start      Dose/Rate Route Frequency Ordered Stop   06/06/14 1200  ciprofloxacin (CIPRO) IVPB 400 mg     400 mg200 mL/hr over 60 Minutes Intravenous Every 12 hours 06/06/14 1114     06/06/14 1200  metroNIDAZOLE (FLAGYL) IVPB 500 mg     500 mg100 mL/hr over 60 Minutes Intravenous Every 8 hours 06/06/14 1114     06/02/14 2200  cefoTEtan (CEFOTAN) 1 g in dextrose 5 % 50 mL IVPB  Status:  Discontinued     1 g100 mL/hr over 30 Minutes Intravenous Every 12 hours 06/02/14 1836 06/06/14 1114   06/02/14 0600  [MAR Hold]  cefoTEtan (CEFOTAN) 2 g in dextrose 5 % 50 mL IVPB     (MAR Hold since 06/02/14 0935)   2 g100 mL/hr over 30 Minutes Intravenous On call to O.R. 06/01/14 1519 06/02/14 1047       Assessment/Plan 1. POD #5, s/p ex lap with right colectomy (colon mass) and diverting left sided colostomy for CV fistula  Plan: 1. Pathology still pending 2. D/C cefotetan and change to Cipro and Flagyl to cover for surgery and to give better pseudomonas coverage for incision 3. Cont to mobilize, IS 4. Made NPO, attempted NG without success.  Will hold off on NG for now since she has flatus  and BM in bag and no more nausea/vomiting.  If her N/V returns, NG per fluero.   5.  PT/OT - SNF at discharge 6.  Talked to daughter at bedside and gave her an update    LOS: 7 days    Coralie Keens 06/07/2014, 7:35 AM Pager: [phone]

## 2014-06-07 NOTE — Consult Note (Addendum)
WOC follow-up: No family members at bedside.  Pouch intact with good seal, patient wearing an abd binder over site with an opening which was cut to allow pouch to drain.  Mod amt liquid brown stool in pouch.Supplies at bedside for staff nurse use.  Recommend home health assistance after discharge.  Educational materials in room.  Placed on [place] discharge program. Julien Girt MSN, RN, [place], [place], [place]

## 2014-06-07 NOTE — Progress Notes (Signed)
Patient ID: [name]  female  OYD:[id number]    DOB: [birth year]    DOA: 05/31/2014  PCP: Nyoka Cowden, MD   Brief history of present illness Patient is a 80 year old female with chronic anemia, chronic diastolic CHF, colovaginal fistula and diverticulosis presented with abdominal pain. She reported she has chronic abdominal pain which was getting worse for the past couple of weeks, nausea, vomiting but no severe weight loss. In the ED, CT scan of the abdomen and pelvis showed 6 cm apple core lesion involving the ED was cecal valve concerning for colon cancer. Patient was admitted for further workup. Patient underwent exploratory laparotomy, rt hemicolectomy, descending colostomy 11/19. Biopsy positive for multifocal invasive adenocarcinoma.   Assessment/Plan: Principal Problem:   Colon tumor/ SBO: Postop day # 5, status post exploratory laparotomy, rt hemicolectomy, descending colostomy  - CT finding of 6 cm apple core lesion concerning for colon cancer - surgery following, NPO due to N/V, patient refused NG tube, PCA discontinued - Oncology consult called, biopsy positive for metastatic (to 2 lymph nodes) invasive adenocarcinoma   Active Problems: Chronic diastolic CHF - 2-D echo in 2011 showed EF 60% with grade 1 diastolic dysfunction -I's and O's with 2.0 L positive, currently npo, receiving IV fluids, hold Lasix for today -monitor volume cautiously  - Portable chest x-ray showed no edema or consolidation  Anemia: Acute on chronic, microcytic iron deficiency anemia likely due to chronic blood loss and colon cancer - Transfused 2 units packed RBCs postoperatively -Hemoglobin down to 7.9, but transfuse 2 units packed RBCs   Leukocytosis: Improving, no fevers or chills   Generalized debility - skilled nursing therapy and rehabilitation per PT OT  DVT Prophylaxis:heparin subcutaneous  Code Status:full code  Family Communication:   Disposition:    Consultants:  Surgery  Gastroenterology  Procedures: exploratory laparotomy, rt hemicolectomy, descending colostomy 11/19   Antibiotics: IV Cefotan   Subjective: Patient seen and examined,feels nauseous, PCA discontinued, had refused NG tube yesterday   Objective: Weight change:   Intake/Output Summary (Last 24 hours) at 06/07/14 1135 Last data filed at 06/07/14 0800  Gross per 24 hour  Intake   1040 ml  Output   2050 ml  Net  -1010 ml   Blood pressure 119/54, pulse 92, temperature 98.9 F (37.2 C), temperature source Oral, resp. rate 18, height 5\' 3"  (1.6 m), weight 84.8 kg (186 lb 15.2 oz), SpO2 100 %.  Physical Exam: General: A x O CVS: S1-S2 clear Chest:No wheezing or rhonchi  Abdomen: Soft, + bowel sounds, ostomy + Extremities: no c/c, trace edema    Lab Results: Basic Metabolic Panel:  Recent Labs Lab 06/06/14 0335 06/07/14 0500  NA 134* 134*  K 4.2 3.7  CL 92* 94*  CO2 26 24  GLUCOSE 106* 93  BUN 17 19  CREATININE 1.03 1.32*  CALCIUM 8.9 8.2*   Liver Function Tests:  Recent Labs Lab 05/31/14 1204  AST 29  ALT 12  ALKPHOS 68  BILITOT 0.2*  PROT 7.5  ALBUMIN 3.4*   No results for input(s): LIPASE, AMYLASE in the last 168 hours. No results for input(s): AMMONIA in the last 168 hours. CBC:  Recent Labs Lab 05/31/14 1204  06/06/14 0335 06/07/14 0500  WBC 7.5  < > 11.8* 8.3  NEUTROABS 6.1  --   --   --   HGB 9.6*  < > 9.6* 7.9*  HCT 32.0*  < > 29.7* 24.9*  MCV 79.0  < > 78.2 78.1  PLT 237  < > 229 190  < > = values in this interval not displayed. Cardiac Enzymes: No results for input(s): CKTOTAL, CKMB, CKMBINDEX, TROPONINI in the last 168 hours. BNP: Invalid input(s): POCBNP CBG:  Recent Labs Lab 06/03/14 1537 06/04/14 0237 06/06/14 1930 06/06/14 2323 06/07/14 0400  GLUCAP 134* 132* 129* 117* 127*     Micro Results: Recent Results (from the past 240 hour(s))  MRSA PCR Screening     Status: None   Collection  Time: 05/31/14  8:56 PM  Result Value Ref Range Status   MRSA by PCR NEGATIVE NEGATIVE Final    Comment:        The GeneXpert MRSA Assay (FDA approved for NASAL specimens only), is one component of a comprehensive MRSA colonization surveillance program. It is not intended to diagnose MRSA infection nor to guide or monitor treatment for MRSA infections.     Studies/Results: Ct Abdomen Pelvis W Contrast  05/31/2014   ADDENDUM REPORT: 05/31/2014 16:27  ADDENDUM: Study discussed by telephone with Dr. Scot Jun On 05/31/2014 at 1625 hrs.   Electronically Signed   By: Lars Pinks M.D.   On: 05/31/2014 16:27   05/31/2014   CLINICAL DATA:  80 year old female with right side abdominal pain, severe in constant for 2 days. Diarrhea. Initial encounter.  EXAM: CT ABDOMEN AND PELVIS WITH CONTRAST  TECHNIQUE: Multidetector CT imaging of the abdomen and pelvis was performed using the standard protocol following bolus administration of intravenous contrast.  CONTRAST:  68mL OMNIPAQUE IOHEXOL 300 MG/ML  SOLN  COMPARISON:  04/28/2014 and earlier.  FINDINGS: Negative lung bases. Mild cardiomegaly. No pericardial or pleural effusion.  Stable visualized osseous structures.  Moderate volume of free fluid in the pelvis is new. Decompressed bladder. Negative rectum. Sigmoid diverticulosis re - identified, no definite adjacent mesenteric inflammation.  Decompressed left colon, transverse colon, at hepatic flexure.  Soft tissue mass like thickening at the ileocecal valve. The cecum is largely decompressed, but the small bowel is been diffusely dilated distally, with a gradual transition to more normal caliber proximal small bowel. The mass like soft tissue appears to affect both this cecum and terminal ileum, measuring up to 6 cm in length, by up to 4 cm in thickness. Pericecal lymph nodes are identified and appear stable small distal small bowel mesenteric nodes up to 8 mm short axis.  In addition to free fluid in the  pelvis and a small bowel mesentery there is a small volume of fluid along the liver. No liver mass identified. Surgically absent gallbladder as before.  Small volume perisplenic fluid. Spleen, pancreas, adrenal glands, and kidneys are stable. Portal venous system is patent. Major arterial structures in the abdomen and pelvis are patent with aortoiliac calcified atherosclerosis noted. No free air. Moderate volume gastric paraesophageal or hiatal hernia contains contrast today. This has not significantly changed.  IMPRESSION: Acute small bowel obstruction due to what appears to be a soft tissue mass (apple core lesion) at the ileocecal valve suspicious for intestinal adenocarcinoma. Up to 8 mm pericecal lymph nodes are stable. No liver or distant metastasis identified. Small volume free fluid in the abdomen and pelvis, favor reactive. No free air.  Electronically Signed: By: Lars Pinks M.D. On: 05/31/2014 16:16   Dg Chest Port 1 View  06/07/2014   CLINICAL DATA:  Dyspnea  EXAM: PORTABLE CHEST - 1 VIEW  COMPARISON:  December 27, 2013  FINDINGS: There is mild atelectatic change in the right base. Elsewhere lungs are clear.  Heart is upper normal in size with pulmonary vascularity within normal limits. No pneumothorax. No adenopathy. There is postoperative change in the lower cervical spine region.  IMPRESSION: Mild atelectasis right base.  No edema or consolidation.   Electronically Signed   By: Lowella Grip M.D.   On: 06/07/2014 09:46   Dg Abd 2 Views  06/02/2014   CLINICAL DATA:  Upper abdominal pain and constipation. Small bowel obstruction.  EXAM: ABDOMEN - 2 VIEW  COMPARISON:  05/31/2014  FINDINGS: Previously administered enteric contrast is now seen extending through the colon. There is persistent moderate gas distention of several loops of small bowel with index loop within the left mid hemiabdomen measuring 3.9 cm diameter. No pneumoperitoneum, pneumatosis or portal venous gas.  Post cholecystectomy.   Moderate scoliotic curvature of the thoracolumbar spine with associated moderate to severe multilevel DDD, incompletely evaluated.  IMPRESSION: Findings suggestive of improved though persistent partial small bowel obstruction. Continued attention on follow-up is recommended.   Electronically Signed   By: Sandi Mariscal M.D.   On: 06/02/2014 09:11    Medications: Scheduled Meds: . sodium chloride   Intravenous Once  . antiseptic oral rinse  7 mL Mouth Rinse q12n4p  . chlorhexidine  15 mL Mouth Rinse BID  . cholecalciferol  1,000 Units Oral Daily  . ciprofloxacin  400 mg Intravenous Q12H  . feeding supplement (PRO-STAT SUGAR FREE 64)  30 mL Oral TID WC  . heparin  5,000 Units Subcutaneous 3 times per day  . metronidazole  500 mg Intravenous Q8H  . sodium chloride  3 mL Intravenous Q12H      LOS: 7 days   Skylor Hughson M.D. [facility] 06/07/2014, 11:35 AM Pager: [phone]  If 7PM-7AM, please contact night-coverage [email or web address] Password [email or web address]

## 2014-06-07 NOTE — Plan of Care (Signed)
Problem: Phase I Progression Outcomes Goal: Sutures/staples intact Outcome: Completed/Met Date Met:  06/07/14

## 2014-06-07 NOTE — Consult Note (Signed)
[place]  [phone]   [name]  DOB: 1930-05-24  MR#: [id number]  CSN#: [id number]    Requesting Physician: [facility]    Patient Care Team: Marletta Lor, MD as PCP - General (Internal Medicine)   Reason for consult: Colon Cancer      History of present illness:  80 y.o.  female with a history of chronic anemia and colovaginal fistula as well as diverticulosis with colonic polyps since 2008, admitted on 05/31/2014 With progressive abdominal pain, worse over the last couple of weeks, accompanied by nausea and vomiting, without weight loss. Workup included a CT of the abdomen and pelvis with contrast, which revealed acute small bowel obstruction due to what appears to be a soft tissue mass (apple core lesion) at the ileocecal valve suspicious for intestinal adenocarcinoma.This mass measures 6 x 4 cm. Up to 8 mm stable pericecal lymph nodes were visualized. No liver or distant metastasis identified. Small volume free fluid in the abdomen and pelvis, Likely reactive, was seen. No free air. Surgery was consulted, recommending exploratory laparotomy, right hemicolectomy, and descending colostomy performed on 06/02/2014. Grossly, there are two tumors identified. The larger tumor is located at the ileocecal valve and consists of an 8.5 cm span of moderately differentiated adenocarcinoma that involves the serosa. This tumor has associated perineural and lymphovascular invasion. The second discontiguous tumor is present in the proximal right colon and spans 4.0 cm and consists of well differentiated adenocarcinoma that is morphologically dissimilar from the larger tumor. This tumor extends into the muscularis propria.Her CEA is 1.7 He is stage mT4, N1b. immunohistochemical stains are normal, with very low probability of microsatellite instability  (See details below )  She is now postoperative, continues to have  intermittent nausea. No vomiting is reported. She has a new ostomy bag, with no signs of infection.No bleeding issues such as epistaxis, hematemesis, hematochezia or hematuria.  She had been up-to-date with screenings, Last colonoscopy was performed 10 years ago. History strong family history of cancer in 2 of her uncles, grandfather and 2 of her children have colon polyps. Denies risk factors for HIV or hepatitis. Denies Diabetes.Denies Tobacco. Denies ETOH. No prior radiation. We were kindly requested to see the patient with recommendations.    Past medical history:      Past Medical History  Diagnosis Date   ANEMIA 06/30/2007   COLONIC POLYPS, HX OF 01/21/2007   COLONIC POLYPS, HX OF 01/21/2007   DEGENERATIVE JOINT DISEASE 05/19/2007   DEPRESSION 10/11/5186   DIASTOLIC HEART FAILURE, CHRONIC 02/27/2009   DIVERTICULOSIS, COLON 01/21/2007   DYSPNEA 11/13/2009   Edema 11/13/2009   GERD 01/21/2007   HIATAL HERNIA WITH REFLUX 01/21/2007   HYPERTENSION 01/21/2007   LIMB PAIN 11/13/2009   NECK PAIN, CHRONIC 02/13/2010   Pure hypercholesterolemia 04/05/2008   PONV (postoperative nausea and vomiting)     Pt had problems with memory after procedure   Colitis     Hx: of   Pneumonia    Bronchitis     Hx: of   FHx: allergies     Hx;of   Heart palpitations     Hx: of   Headache(784.0)    CORONARY ARTERY DISEASE 04/05/2008    BMS to RCA 10/2007   TIA (transient ischemic attack)     Past surgical history:      Past Surgical History  Procedure Laterality Date   Appendectomy     Cholecystectomy  Abdominal hysterectomy     Lumbar laminectomy     Total knee arthroplasty      3 left knee replacements, 2 right knee replacements   Rotator cuff repair     Neck surgery     Cataract extraction w/ intraocular lens  implant, bilateral Bilateral    Tonsillectomy     Dilation and curettage of uterus     Patellectomy Right 09/14/2013    DR Marlou Sa   Patellectomy Right 09/14/2013      Procedure: PATELLECTOMY;  Surgeon: Meredith Pel, MD;  Location: [facility];  Service: Orthopedics;  Laterality: Right;   Joint replacement     Cardiac catheterization  2009*   Coronary angioplasty with stent placement  2009    "2 days after 1st cath"   Partial colectomy N/A 06/02/2014    Procedure: RIGHT PARTIAL COLECTOMY ;  Surgeon: Donnie Mesa, MD;  Location: [facility];  Service: General;  Laterality: N/A;   Colostomy N/A 06/02/2014    Procedure: DIVERTING DESCENDING END COLOSTOMY;  Surgeon: Donnie Mesa, MD;  Location: [facility];  Service: General;  Laterality: N/A;    Medications:  Prior to Admission:  Prescriptions prior to admission  Medication Sig Dispense Refill Last Dose   amoxicillin-clavulanate (AUGMENTIN) 875-125 MG per tablet Take 1 tablet by mouth 2 (two) times daily. For 30 days   05/31/2014 at Unknown time   Ascorbic Acid (VITAMIN [name]) 500 MG CAPS Take 1 tablet by mouth daily.    05/31/2014 at Unknown time   baclofen (LIORESAL) 10 MG tablet Take 10 mg by mouth 3 (three) times daily.   05/31/2014 at Unknown time   benazepril (LOTENSIN) 40 MG tablet Take 0.5 tablets (20 mg total) by mouth daily.   05/31/2014 at Unknown time   benzonatate (TESSALON) 100 MG capsule Take 100 mg by mouth 3 (three) times daily as needed for cough.   Past Month at Unknown time   cholecalciferol (VITAMIN D) 1000 UNITS tablet Take 1,000 Units by mouth daily.   05/31/2014 at Unknown time   ferrous sulfate 325 (65 FE) MG tablet Take 1 tablet (325 mg total) by mouth 2 (two) times daily with a meal.  3 05/31/2014 at Unknown time   furosemide (LASIX) 20 MG tablet Take 20 mg by mouth daily.   05/31/2014 at Unknown time   gabapentin (NEURONTIN) 600 MG tablet Take 600 mg by mouth daily.   05/31/2014 at Unknown time   HYDROcodone-acetaminophen (NORCO) 5-325 MG per tablet Take 1-2 tablets by mouth every 6 (six) hours as needed for moderate pain. 60 tablet 0 Past Month at Unknown time   LORazepam  (ATIVAN) 0.5 MG tablet Take 1 tablet (0.5 mg total) by mouth at bedtime. 30 tablet 2 05/30/2014 at Unknown time   metoprolol (LOPRESSOR) 50 MG tablet Take 1 tablet (50 mg total) by mouth 2 (two) times daily. 180 tablet 3 05/31/2014 at 0700   Multiple Vitamins-Minerals (MULTIVITAMIN WITH MINERALS) tablet Take 1 tablet by mouth daily.   05/31/2014 at Unknown time   nitroGLYCERIN (NITROSTAT) 0.4 MG SL tablet Place 0.4 mg under the tongue every 5 (five) minutes as needed for chest pain.    unknown   sertraline (ZOLOFT) 50 MG tablet Take 50 mg by mouth daily.   05/31/2014 at Unknown time   simvastatin (ZOCOR) 40 MG tablet Take 1 tablet (40 mg total) by mouth at bedtime. 90 tablet 3 05/30/2014 at Unknown time   spironolactone (ALDACTONE) 25 MG tablet Take 1 tablet (25 mg  total) by mouth daily. 90 tablet 3 05/31/2014 at Unknown time   traMADol (ULTRAM) 50 MG tablet Take 50 mg by mouth every 8 (eight) hours.    05/30/2014 at Unknown time   traZODone (DESYREL) 100 MG tablet Take 0.5 tablets (50 mg total) by mouth at bedtime. 90 tablet 1 05/30/2014 at Unknown time   triamcinolone cream (KENALOG) 0.1 % Apply 1 application topically 2 (two) times daily as needed (Eczema on legs).   Past Month at Unknown time    JHE:RDEYCXKGYJEHU **OR** acetaminophen, alum & mag hydroxide-simeth, morphine injection, ondansetron (ZOFRAN) IV  Allergies: No Known Allergies  Family history:     Family History  Problem Relation Age of Onset   Osteoarthritis Mother    Heart failure Mother   Strong family history of colon cancer in 2 of her brothers, one uncle, and 2 children with a history of colon polyps  That is strong history of congenital heart disease, and thyroid disease                                         Social history: History   Social History   Marital Status: Married    Spouse Name: N/A    Number of Children: N/A   Years of Education: N/A   Occupational History   Not on file.   Social  History Main Topics   Smoking status: Former Smoker    Quit date: 07/16/1967   Smokeless tobacco: Never Used   Alcohol Use: No   Drug Use: No   Sexual Activity: Not on file   Other Topics Concern   Not on file   Social History Narrative   Married, 2 children.  As of November 2015 her husband has been living in a nursing home for tendon after years as he suffers from multiple medical problems.   Patient does not drink alcohol nor does she smoke or chew tobacco products   Right handed   8th grade   1 cup daily    ROS: See HPI for significant positives, the rest of the ROS negative.   Physical Exam    ECOG PERFORMANCE STATUS:2-3  Filed Vitals:   06/07/14 1151  BP: 129/50  Pulse: 99  Temp: 98.5 F (36.9 [name])  Resp: 16   Filed Weights   05/31/14 1257 06/02/14 1828  Weight: 170 lb (77.111 kg) 186 lb 15.2 oz (84.8 kg)    GENERAL:alert, no distress and comfortable SKIN: skin color, texture, turgor are normal, no rashes or significant lesions EYES: normal, conjunctiva are pink and non-injected, sclera clear OROPHARYNX:no exudate, no erythema and lips, buccal mucosa, and tongue normal  NECK: supple, thyroid normal size, non-tender, without nodularity LYMPH:  no palpable lymphadenopathy in the cervical, axillary or inguinal LUNGS: clear to auscultation and percussion with normal breathing effort HEART: regular rate & rhythm and no murmurs and no lower extremity edema ABDOMEN soft, with incisional tenderness.Ostomy bag with stool. Bowel sounds present, but distant due to bandaged area. Musculoskeletal:no cyanosis of digits and no clubbing  PSYCH: alert & oriented x 3 with fluent speech NEURO: no focal motor/sensory deficits   Lab results:       CBC  Recent Labs Lab 06/03/14 0251 06/04/14 0313 06/05/14 0255 06/06/14 0335 06/07/14 0500  WBC 15.1* 14.4* 12.1* 11.8* 8.3  HGB 10.2* 9.5* 8.7* 9.6* 7.9*  HCT 32.1* 30.4* 27.6* 29.7* 24.9*  PLT  224 205 192 229 190    MCV 79.3 77.9* 79.5 78.2 78.1  MCH 25.2* 24.4* 25.1* 25.3* 24.8*  MCHC 31.8 31.3 31.5 32.3 31.7  RDW 22.1* 22.8* 22.7* 22.6* 22.8*    Anemia panel:  No results for input(s): VITAMINB12, FOLATE, FERRITIN, TIBC, IRON, RETICCTPCT in the last 72 hours.   Chemistries   Recent Labs Lab 06/03/14 0251 06/04/14 0313 06/05/14 0255 06/06/14 0335 06/07/14 0500  NA 138 133* 132* 134* 134*  K 4.8 4.8 4.3 4.2 3.7  CL 103 99 95* 92* 94*  CO2 $Re'21 21 23 26 24  'qWe$ GLUCOSE 174* 116* 106* 106* 93  BUN 25* $Remov'21 14 17 19  'geQdOs$ CREATININE 1.11* 0.98 0.88 1.03 1.32*  CALCIUM 8.4 8.3* 8.3* 8.9 8.2*     Coagulation profile  Recent Labs Lab 05/31/14 1909  INR 1.19    Urine Studies No results for input(s): UHGB, CRYS in the last 72 hours.  Invalid input(s): UACOL, UAPR, USPG, UPH, UTP, UGL, UKET, UBIL, UNIT, UROB, ULEU, UEPI, UWBC, URBC, UBAC, CAST, UCOM, BILUA  Studies:      Ct Abdomen Pelvis W Contrast  05/31/2014   ADDENDUM REPORT: 05/31/2014 16:27  ADDENDUM: Study discussed by telephone with Dr. Scot Jun On 05/31/2014 at 1625 hrs.   Electronically Signed   By: Lars Pinks M.D.   On: 05/31/2014 16:27   05/31/2014   CLINICAL DATA:  80 year old female with right side abdominal pain, severe in constant for 2 days. Diarrhea. Initial encounter.  EXAM: CT ABDOMEN AND PELVIS WITH CONTRAST  TECHNIQUE: Multidetector CT imaging of the abdomen and pelvis was performed using the standard protocol following bolus administration of intravenous contrast.  CONTRAST:  37mL OMNIPAQUE IOHEXOL 300 MG/ML  SOLN  COMPARISON:  04/28/2014 and earlier.  FINDINGS: Negative lung bases. Mild cardiomegaly. No pericardial or pleural effusion.  Stable visualized osseous structures.  Moderate volume of free fluid in the pelvis is new. Decompressed bladder. Negative rectum. Sigmoid diverticulosis re - identified, no definite adjacent mesenteric inflammation.  Decompressed left colon, transverse colon, at hepatic flexure.  Soft tissue  mass like thickening at the ileocecal valve. The cecum is largely decompressed, but the small bowel is been diffusely dilated distally, with a gradual transition to more normal caliber proximal small bowel. The mass like soft tissue appears to affect both this cecum and terminal ileum, measuring up to 6 cm in length, by up to 4 cm in thickness. Pericecal lymph nodes are identified and appear stable small distal small bowel mesenteric nodes up to 8 mm short axis.  In addition to free fluid in the pelvis and a small bowel mesentery there is a small volume of fluid along the liver. No liver mass identified. Surgically absent gallbladder as before.  Small volume perisplenic fluid. Spleen, pancreas, adrenal glands, and kidneys are stable. Portal venous system is patent. Major arterial structures in the abdomen and pelvis are patent with aortoiliac calcified atherosclerosis noted. No free air. Moderate volume gastric paraesophageal or hiatal hernia contains contrast today. This has not significantly changed.  IMPRESSION: Acute small bowel obstruction due to what appears to be a soft tissue mass (apple core lesion) at the ileocecal valve suspicious for intestinal adenocarcinoma. Up to 8 mm pericecal lymph nodes are stable. No liver or distant metastasis identified. Small volume free fluid in the abdomen and pelvis, favor reactive. No free air.  Electronically Signed: By: Lars Pinks M.D. On: 05/31/2014 16:16   Dg Chest Port 1 View  06/07/2014  CLINICAL DATA:  Dyspnea  EXAM: PORTABLE CHEST - 1 VIEW  COMPARISON:  December 27, 2013  FINDINGS: There is mild atelectatic change in the right base. Elsewhere lungs are clear. Heart is upper normal in size with pulmonary vascularity within normal limits. No pneumothorax. No adenopathy. There is postoperative change in the lower cervical spine region.  IMPRESSION: Mild atelectasis right base.  No edema or consolidation.   Electronically Signed   By: Lowella Grip M.D.   On:  06/07/2014 09:46   Dg Abd 2 Views  06/02/2014   CLINICAL DATA:  Upper abdominal pain and constipation. Small bowel obstruction.  EXAM: ABDOMEN - 2 VIEW  COMPARISON:  05/31/2014  FINDINGS: Previously administered enteric contrast is now seen extending through the colon. There is persistent moderate gas distention of several loops of small bowel with index loop within the left mid hemiabdomen measuring 3.9 cm diameter. No pneumoperitoneum, pneumatosis or portal venous gas.  Post cholecystectomy.  Moderate scoliotic curvature of the thoracolumbar spine with associated moderate to severe multilevel DDD, incompletely evaluated.  IMPRESSION: Findings suggestive of improved though persistent partial small bowel obstruction. Continued attention on follow-up is recommended.   Electronically Signed   By: Sandi Mariscal M.D.   On: 06/02/2014 09:11   FINAL for NIANI, MOURER ([id number]) Patient: [name], [name] Collected: 06/02/2014 Client: [facility] Accession: [id number] Received: 06/02/2014 Donnie Mesa, MD DOB: 1930-05-06 Age: 55 Gender: F Reported: 06/06/2014 [address] Patient Ph: [phone] MRN #: [id number] [place], [place] [address] Visit #: [id number].[place]-ABA0 Chart #: Phone:  Fax: CC: REPORT OF SURGICAL PATHOLOGY ADDITIONAL INFORMATION: Mismatch Repair (MMR) Protein Immunohistochemistry (IHC) IHC Expression Result: MLH1: Preserved nuclear expression (greater 50% tumor expression) MSH2: Preserved nuclear expression (greater 50% tumor expression) MSH6: Preserved nuclear expression (greater 50% tumor expression) PMS2: Preserved nuclear expression (greater 50% tumor expression) * Internal control demonstrates intact nuclear expression Interpretation: NORMAL There is preserved expression of the major and minor MMR proteins. There is a very low probability that microsatellite instability (MSI) is present. However, certain clinically significant MMR protein mutations Comp result in  preservation of nuclear expression. It is recommended that the preservation of protein expression be correlated with molecular based MSI testing. References: 1. Guidelines on Genetic Evaluation and Management of Lynch Syndrome: A Consensus Statement by the [place] Multi-Society Task Force on Colorectal Cancer Gae Dry. Sherlie Ban , MD, and others . Am Nicki [place] 2014; [phone]; doi: 10.1038/ajg.2014.186; published online 02 February 2013 2. Outcomes of screening endometrial cancer patients for Lynch syndrome by patient-administered checklist. Olena Heckle MS, and others. Gynecol Oncol 2013;131(3):619-623. Susanne Greenhouse MD Pathologist, Electronic Signature ( Signed 06/07/2014) FINAL DIAGNOSIS Diagnosis Colon, segmental resection for tumor, right - MULTIFOCAL INVASIVE ADENOCARCINOMA, SPANNING 8.5 CM AND 4.0 CM. - ADENOCARCINOMA INVOLVES THE SEROSA. - LYMPHOVASCULAR INVASION IS IDENTIFIED. 1 of 4 FINAL for [name], [name] ([id number]) Diagnosis(continued) - PERINEURAL INVASION IS IDENTIFIED. - METASTATIC ADENOCARCINOMA IN 2 OF 16 LYMPH NODES (2/16) WITH EXTRACAPSULAR EXTENSION. - THE SURGICAL RESECTION MARGINS ARE NEGATIVE FOR ADENOCARCINOMA. - SEE ONCOLOGY TABLE BELOW. Microscopic Comment COLON AND RECTUM (INCLUDING TRANS-ANAL RESECTION): Specimen: Terminal ileum and right colon Procedure: Resection Tumor site: Ileocecal valve (tumor #1) and proximal mid ascending colon (tumor #2) Specimen integrity: Intact Macroscopic intactness of mesorectum: N/A Macroscopic tumor perforation: Not identified Invasive tumor: Tumor #1 (ileocecal valve) Maximum size: 8.5 cm (gross measurement) Histologic type(s): Adenocarcinoma Histologic grade and differentiation: G2: moderately differentiated/low grade Type of polyp in which invasive carcinoma arose: Likely tubular adenoma  Microscopic extension of invasive tumor: Adenocarcinoma involves the serosa Invasive tumor: Tumor #2 (proximal mid ascending  colon) Maximum size: 4.0 cm (gross measurement) Histologic type(s): Adenocarcinoma Histologic grade and differentiation: G1: well differentiated/low grade Type of polyp in which invasive carcinoma arose: Tubulovillous adenoma Microscopic extension of invasive tumor: Adenocarcinoma extends into the muscularis propria Lymph-Vascular invasion: Present Peri-neural invasion: Present Tumor deposit(s) (discontinuous extramural extension): Not identified Resection margins: Proximal margin: 32.0 cm Distal margin: 7.5 cm Circumferential (radial) (posterior ascending, posterior descending; lateral and posterior mid-rectum; and entire lower 1/3 rectum): 3.0 cm Treatment effect (neo-adjuvant therapy): N/A Additional polyp(s): Not identified Non-neoplastic findings: No significant findings Lymph nodes: number examined 16; number positive: 2 Pathologic Staging: mT4, N1b Ancillary studies: MSI testing will be performed on the larger higher grade tumor and the results reported separately. Additional studies can be performed upon clinician request. Comments: Grossly, there are two tumors identified. The larger tumor is located at the ileocecal valve and consists of an 8.5 cm span of moderately differentiated adenocarcinoma that involves the serosa. This tumor has associated perineural and lymphovascular invasion. The second discontiguous tumor is present in the proximal right colon and spans 4.0 cm and consists of well differentiated adenocarcinoma that is morphologically dissimilar from the larger tumor. This tumor extends into the muscularis propria. Enid Cutter MD Pathologist, Electronic Signature (Case signed 06/06/2014) Specimen Gross and Clinical Information 2 of 4 FINAL for [name], [name] ([id number]) Specimen(s) Obtained: Colon, segmental resection for tumor, right Specimen Clinical Information cecal cancer, rectovaginal fistula (cm) Gross Specimen: Right colon including portion of terminal  ileum. Appendix is absent. Specimen integrity: Intact. Specimen length: There are 32 cm of terminal ileum, and 18 cm of right colon from cecum to distal margin. Mesorectal intactness: Not applicable. Tumor location: There are two masses, one involving the ileocecal valve, proximal right colon and portion of cecum. The other mass is in the proximal to mid ascending colon. Tumor size: The mass involving ileocecal valve and proximal right colon/cecum is 4 cm in length, 8.5 cm in width, tan to tan-red, firm, sessile and centrally ulcerated. The second mass is a 3 cm in length and 4 cm in width tan to tan-red firm sessile mass. Percent of bowel circumference involved: The more proximal mass involves the full circumference of the ileocecal valve and proximal right colon. The smaller more distal mass involves 50% of the circumference. Tumor distance to margins: Proximal: 32 cm from the more proximal mass. Distal: 7.5 cm from the more distal mass. Radial (posterior ascending, posterior descending; lateral and posterior mid-rectum; and entire lower 1/3 rectum): The more proximal mass is 3 cm from the nonperitonealized soft tissue margin, and the smaller more distal mass is 4 cm from the nonperitonealized soft tissue margin. Macroscopic extent of tumor invasion: The larger more proximal mass involves the full-thickness of the muscularis, and invades into underlying fat. The smaller more distal mass involves but does not grossly transect the muscularis. Total presumed lymph nodes: Found within the fat are 19 possible lymph nodes ranging from 0.3 to 1.1 cm. Extramural satellite tumor nodules: None. Mucosal polyp(s): None. Additional findings: 7 cm from the proximal end is a kinked area of small bowel, with the kinked area having diffuse serosal adhesions. No mucosal defects or lesions are identified in this area. Block summary: A = proximal margin. B = distal margin. [name], D = grossly deepest invasion  of tumor at ileocecal valve. E = sections of tumor at ileocecal valve, with nearest serosa. F =  section through ileocecal valve, including portion of terminal ileum. G = tissue for molecular testing. H - J = sections of smaller more distal mass. K = four nodes, whole. L = four nodes, whole. M = four nodes, whole. N = three nodes, whole. O = three nodes, whole. P = one node, bisected. Q = sections of adhesions at kinked area of ileum. Total, seventeen blocks. (SSW:ecj 06/03/2014) Disclaimer Some of these immunohistochemical stains Schlitt have been developed and the performance characteristics determined by [facility] Some Minnie not have been cleared or approved by the U.S. Food and Drug Administration. The FDA has determined that such clearance or approval is not necessary. This test is used for clinical purposes. It should not be regarded as investigational or for research. This laboratory is certified under the [place] (CLIA-88) as qualified to perform high complexity clinical laboratory testing. 3 of 4 FINAL for [name], [name] ([id number]) Report signed out from the following location(s) Technical Component and Interpretation performed at [facility] [place], [place], [place] [address]. CLIA #: [id number], 15 of 4 Assessment/Plan:80 y.o. female  with   Stage mT4, N1b Adenocarcinoma of the colon Patient is status post exploratory laparotomy, right hemicolectomy, and descending colostomy performed on 06/02/2014. Grossly, there are two tumors identified. The larger tumor is located at the ileocecal valve and consists of an 8.5 cm span of moderately differentiated adenocarcinoma that involves the serosa. This tumor has associated perineural and lymphovascular invasion.  The second discontiguous tumor is present in the proximal right colon and spans 4.0 cm and consists of well differentiated adenocarcinoma that is  morphologically dissimilar from the larger tumor. This tumor extends into the muscularis propria.MSI is negative.  Treatment options are to be entertained, in view of strong family history of cancer of the colon, for which KRAS mutation Hoos need to be tested. Consider CT of the chest to complete the staging. If discharged, this can be done as outpatient. She will be evaluated at the office of the cancer center. An appointment will be arranged prior to discharge  Anemia Of chronic disease, blood loss, Iron deficiency, dilution and CHF Continue oral iron as outpatient No transfusion is indicated at this time, [place] do so if hemoglobin is less than 7 or if the patient becomes symptomatic  Leukocytosis Likely reactive, in the setting of recent surgery, And COPD She was Placed on antibiotics with Cipro, and she received Flagyl as well for better coverage This has resolved, with a white count of 7.9 today  Congestive heart failure, diastolic Hospital admitting team  Full code  Rondel Jumbo, PA-[name] 06/07/2014   Addendum I have seen the patient, examined her. I agree with the assessment and and plan and have edited the notes.  Pt is a 80 yo female with newly diagnosed [id number], at least stage IIIB right colon cancer, pending CT chest to complete staging, s/p complete surgical resection. She received 2u RBC today for anemia. She would like need time to recover from her surgery. I discussed adjuvant chemo with her briefly today, and will see her back in 3-4 weeks after her surgery for further discussion. She has high risk of cancer recurrence due to the locally advanced stage, and we usually recommend adjuvant FOLFOX for stage III disease. However, due to her advanced age and comorbilities, it depends on her recovery from surgery, we [place] consider single agent capacitabine, vs standard FOLFOX, or observation alone if she does not  recover well. I will schedule her follow up appointment with me. Truitt Merle   MD Hem/onc

## 2014-06-08 ENCOUNTER — Telehealth: Payer: Self-pay | Admitting: Hematology

## 2014-06-08 LAB — TYPE AND SCREEN
ABO/RH(D): A POS
Antibody Screen: NEGATIVE
Unit division: 0
Unit division: 0

## 2014-06-08 LAB — BASIC METABOLIC PANEL
Anion gap: 12 (ref 5–15)
BUN: 12 mg/dL (ref 6–23)
CO2: 26 mEq/L (ref 19–32)
Calcium: 8 mg/dL — ABNORMAL LOW (ref 8.4–10.5)
Chloride: 101 mEq/L (ref 96–112)
Creatinine, Ser: 0.89 mg/dL (ref 0.50–1.10)
GFR calc Af Amer: 68 mL/min — ABNORMAL LOW (ref >90)
GFR calc non Af Amer: 59 mL/min — ABNORMAL LOW (ref >90)
Glucose, Bld: 96 mg/dL (ref 70–99)
Potassium: 3.1 mEq/L — ABNORMAL LOW (ref 3.7–5.3)
Sodium: 139 mEq/L (ref 137–147)

## 2014-06-08 LAB — CBC
HCT: 31.4 % — ABNORMAL LOW (ref 36.0–46.0)
Hemoglobin: 9.8 g/dL — ABNORMAL LOW (ref 12.0–15.0)
MCH: 24.3 pg — ABNORMAL LOW (ref 26.0–34.0)
MCHC: 31.2 g/dL (ref 30.0–36.0)
MCV: 77.7 fL — ABNORMAL LOW (ref 78.0–100.0)
Platelets: 199 10*3/uL (ref 150–400)
RBC: 4.04 MIL/uL (ref 3.87–5.11)
RDW: 21.2 % — ABNORMAL HIGH (ref 11.5–15.5)
WBC: 8.4 10*3/uL (ref 4.0–10.5)

## 2014-06-08 MED ORDER — LORAZEPAM 0.5 MG PO TABS
0.5000 mg | ORAL_TABLET | Freq: Two times a day (BID) | ORAL | Status: DC | PRN
  Administered 2014-06-08 – 2014-06-10 (×3): 0.5 mg via ORAL
  Filled 2014-06-08 (×3): qty 1

## 2014-06-08 MED ORDER — SERTRALINE HCL 50 MG PO TABS
50.0000 mg | ORAL_TABLET | Freq: Every day | ORAL | Status: DC
  Administered 2014-06-08 – 2014-06-10 (×3): 50 mg via ORAL
  Filled 2014-06-08 (×4): qty 1

## 2014-06-08 MED ORDER — HYDROCODONE-ACETAMINOPHEN 5-325 MG PO TABS
1.0000 | ORAL_TABLET | Freq: Four times a day (QID) | ORAL | Status: DC | PRN
  Administered 2014-06-08 – 2014-06-10 (×7): 2 via ORAL
  Filled 2014-06-08 (×7): qty 2

## 2014-06-08 MED ORDER — LORAZEPAM 2 MG/ML IJ SOLN
0.5000 mg | Freq: Two times a day (BID) | INTRAMUSCULAR | Status: DC | PRN
  Administered 2014-06-08: 0.5 mg via INTRAVENOUS
  Filled 2014-06-08: qty 1

## 2014-06-08 MED ORDER — BENAZEPRIL HCL 20 MG PO TABS
20.0000 mg | ORAL_TABLET | Freq: Every day | ORAL | Status: DC
  Administered 2014-06-08 – 2014-06-10 (×3): 20 mg via ORAL
  Filled 2014-06-08 (×3): qty 1

## 2014-06-08 MED ORDER — BACLOFEN 10 MG PO TABS
10.0000 mg | ORAL_TABLET | Freq: Three times a day (TID) | ORAL | Status: DC
  Administered 2014-06-08 – 2014-06-10 (×6): 10 mg via ORAL
  Filled 2014-06-08 (×9): qty 1

## 2014-06-08 MED ORDER — FUROSEMIDE 40 MG PO TABS
40.0000 mg | ORAL_TABLET | Freq: Every day | ORAL | Status: DC
  Administered 2014-06-08 – 2014-06-10 (×2): 40 mg via ORAL
  Filled 2014-06-08 (×3): qty 1

## 2014-06-08 MED ORDER — BISACODYL 10 MG RE SUPP: 10.0000 mg | Freq: Once | RECTAL | Status: DC

## 2014-06-08 MED ORDER — MORPHINE SULFATE 2 MG/ML IJ SOLN
2.0000 mg | Freq: Four times a day (QID) | INTRAMUSCULAR | Status: DC | PRN
  Administered 2014-06-08 – 2014-06-09 (×2): 2 mg via INTRAVENOUS
  Filled 2014-06-08 (×2): qty 1

## 2014-06-08 MED ORDER — GABAPENTIN 600 MG PO TABS
600.0000 mg | ORAL_TABLET | Freq: Every day | ORAL | Status: DC
  Administered 2014-06-08 – 2014-06-10 (×3): 600 mg via ORAL
  Filled 2014-06-08 (×3): qty 1

## 2014-06-08 NOTE — Telephone Encounter (Signed)
Lm to confirm appt for Dec. Mailed cal.

## 2014-06-08 NOTE — Care Management Note (Signed)
  Page 1 of 1   06/08/2014     9:01:26 AM CARE MANAGEMENT NOTE 06/08/2014  Patient:  [name], [name]   Account Number:  [id number]  Date Initiated:  06/03/2014  Documentation initiated by:  Marvetta Gibbons  Subjective/Objective Assessment:   Pt admitted with SBO     Action/Plan:   PTA pt lived at home   Anticipated DC Date:  06/09/2014   Anticipated DC Plan:  [place]         Choice offered to / List presented to:             Status of service:  In process, will continue to follow Medicare Important Message given?  YES (If response is "NO", the following Medicare IM given date fields will be blank) Date Medicare IM given:  06/03/2014 Medicare IM given by:  Marvetta Gibbons Date Additional Medicare IM given:  06/08/2014 Additional Medicare IM given by:  Magdalen Spatz  Discharge Disposition:    Per UR Regulation:  Reviewed for med. necessity/level of care/duration of stay  If discussed at [place] of Stay Meetings, dates discussed:   06/07/2014    Comments:

## 2014-06-08 NOTE — Clinical Social Work Note (Signed)
Patients first choice for SNF, [facility], made official bed offer on patient.  CSW called patients daughter, [name], to inform of bed offer and review other choices- unable to contact- left message.  CSW will continue to follow.  Domenica Reamer, [place] Social Worker [phone]

## 2014-06-08 NOTE — Progress Notes (Signed)
[facility] Progress Note  6 Days Post-Op  Subjective: Pt was told by oncology that she had cancer.  Family upset because they were not with her when the patient received the news.  The daughter says she's having a lot of anxiety  Objective: Vital signs in last 24 hours: Temp:  [97.6 F (36.4 C)-99.6 F (37.6 C)] 99.3 F (37.4 C) (11/25 0519) Pulse Rate:  [91-108] 91 (11/25 0519) Resp:  [16-18] 17 (11/25 0519) BP: (105-140)/(50-75) 139/71 mmHg (11/25 0519) SpO2:  [94 %-100 %] 96 % (11/25 0519) Last BM Date: 06/08/14  Intake/Output from previous day: 11/24 0701 - 11/25 0700 In: 2540 [I.V.:1200; Blood:1340] Out: 1470 [Urine:1450; Stool:20] Intake/Output this shift:    PE: Gen:  Alert, NAD, pleasant Card:  RRR, no M/G/R heard Pulm:  CTA, no W/R/R, IS up to 1150 Abd: Soft, ND, minimal tenderness at incision site, +BS, no HSM, incisions C/D/I with staples in place, inferior aspect and right side of incision with erythema without significant induration, and no fluctuance   Lab Results:   Recent Labs  06/07/14 2000 06/08/14 0548  WBC 9.0 8.4  HGB 10.0* 9.8*  HCT 30.9* 31.4*  PLT 201 199   BMET  Recent Labs  06/07/14 0500 06/08/14 0548  NA 134* 139  K 3.7 3.1*  CL 94* 101  CO2 24 26  GLUCOSE 93 96  BUN 19 12  CREATININE 1.32* 0.89  CALCIUM 8.2* 8.0*   PT/INR No results for input(s): LABPROT, INR in the last 72 hours. CMP     Component Value Date/Time   NA 139 06/08/2014 0548   K 3.1* 06/08/2014 0548   CL 101 06/08/2014 0548   CO2 26 06/08/2014 0548   GLUCOSE 96 06/08/2014 0548   BUN 12 06/08/2014 0548   CREATININE 0.89 06/08/2014 0548   CALCIUM 8.0* 06/08/2014 0548   PROT 7.5 05/31/2014 1204   ALBUMIN 3.4* 05/31/2014 1204   AST 29 05/31/2014 1204   ALT 12 05/31/2014 1204   ALKPHOS 68 05/31/2014 1204   BILITOT 0.2* 05/31/2014 1204   GFRNONAA 59* 06/08/2014 0548   GFRAA 68* 06/08/2014 0548   Lipase  No results found for:  LIPASE     Studies/Results: Dg Chest Port 1 View  06/07/2014   CLINICAL DATA:  Dyspnea  EXAM: PORTABLE CHEST - 1 VIEW  COMPARISON:  December 27, 2013  FINDINGS: There is mild atelectatic change in the right base. Elsewhere lungs are clear. Heart is upper normal in size with pulmonary vascularity within normal limits. No pneumothorax. No adenopathy. There is postoperative change in the lower cervical spine region.  IMPRESSION: Mild atelectasis right base.  No edema or consolidation.   Electronically Signed   By: Lowella Grip M.D.   On: 06/07/2014 09:46    Anti-infectives: Anti-infectives    Start     Dose/Rate Route Frequency Ordered Stop   06/06/14 1200  ciprofloxacin (CIPRO) IVPB 400 mg     400 mg200 mL/hr over 60 Minutes Intravenous Every 12 hours 06/06/14 1114     06/06/14 1200  metroNIDAZOLE (FLAGYL) IVPB 500 mg     500 mg100 mL/hr over 60 Minutes Intravenous Every 8 hours 06/06/14 1114     06/02/14 2200  cefoTEtan (CEFOTAN) 1 g in dextrose 5 % 50 mL IVPB  Status:  Discontinued     1 g100 mL/hr over 30 Minutes Intravenous Every 12 hours 06/02/14 1836 06/06/14 1114   06/02/14 0600  [MAR Hold]  cefoTEtan (CEFOTAN) 2 g  in dextrose 5 % 50 mL IVPB     (MAR Hold since 06/02/14 0935)   2 g100 mL/hr over 30 Minutes Intravenous On call to O.R. 06/01/14 1519 06/02/14 1047       Assessment/Plan POD #6, s/p ex lap with right colectomy (colon mass) and diverting left sided colostomy for CV fistula  Plan: 1.  Shows invasive adenocarcinoma with 2/16 lymph nodes affected at least stage IIIb prior to staging scans being completed 2.  Cipro and Flagyl to cover for surgery and to give better pseudomonas coverage for incision - some erythema to lower midline wound - [place] be from tape adhesive, closely monitor since she's already on abx.  No fluid collection palpated.  Monitor in case we need to remove staples. 3.  Cont to mobilize, IS 4.  Start clears, fulls at dinner, okay to bring outside food if  follows diet. 5. PT/OT - SNF at discharge 6. Resumed home meds except BP - will leave these up to IM, resumed ativan - but will give orally now 7.  Talked to daughter and granddaughter at bedside and gave them an update.  Family unsure if they want her to have to go through chemo    LOS: 8 days    [place], [facility] 06/08/2014, 9:30 AM Pager: [phone]

## 2014-06-08 NOTE — Progress Notes (Signed)
Patient ID: [name]  female  XBJ:[id number]    DOB: [birth year]    DOA: 05/31/2014  PCP: Nyoka Cowden, MD   Brief history of present illness Patient is a 80 year old female with chronic anemia, chronic diastolic CHF, colovaginal fistula and diverticulosis presented with abdominal pain. She reported she has chronic abdominal pain which was getting worse for the past couple of weeks, nausea, vomiting but no severe weight loss. In the ED, CT scan of the abdomen and pelvis showed 6 cm apple core lesion involving the ED was cecal valve concerning for colon cancer. Patient was admitted for further workup. Patient underwent exploratory laparotomy, rt hemicolectomy, descending colostomy 11/19. Biopsy positive for multifocal invasive adenocarcinoma.   Assessment/Plan: Principal Problem:   Colon tumor/ SBO: Postop day # 6, status post exploratory laparotomy, rt hemicolectomy, descending colostomy  - CT finding of 6 cm apple core lesion concerning for colon cancer - surgery following, placed on clear liquid diet - biopsy positive for metastatic (to 2 lymph nodes) invasive adenocarcinoma  - Patient seen by oncology, recommended outpatient chemotherapy once recovered from the surgery, out patient f/u with Dr Burr Medico  Active Problems: Mild acute on Chronic diastolic CHF - 2-D echo in 2011 showed EF 60% with grade 1 diastolic dysfunction -I's and O's with 3.0L positive, started on oral dose of Lasix, 40 mg daily  Anemia: Acute on chronic, microcytic iron deficiency anemia likely due to chronic blood loss and colon cancer - Transfused 2 units packed RBCs postoperatively -Hemoglobin down to 7.9, but transfuse 2 units packed RBCs on 11/24  Leukocytosis: Improving, no fevers or chills   Generalized debility - skilled nursing therapy and rehabilitation per PT OT  DVT Prophylaxis:heparin subcutaneous  Code Status:full code  Family Communication:   Disposition:    Consultants:  Surgery  Gastroenterology  Oncology  Procedures: exploratory laparotomy, rt hemicolectomy, descending colostomy 11/19   Antibiotics: IV Cefotan   Subjective: Patient seen and examined, feels a lot better today, wanting to eat  Objective: Weight change:   Intake/Output Summary (Last 24 hours) at 06/08/14 1325 Last data filed at 06/08/14 0852  Gross per 24 hour  Intake   2205 ml  Output   1110 ml  Net   1095 ml   Blood pressure 139/71, pulse 91, temperature 98.8 F (37.1 C), temperature source Oral, resp. rate 17, height 5\' 3"  (1.6 m), weight 84.8 kg (186 lb 15.2 oz), SpO2 96 %.  Physical Exam: General: A x O x 3 CVS: S1-S2 clear Chest: CTAB Abdomen: Soft, + bowel sounds, ostomy + Extremities: no c/c, trace edema    Lab Results: Basic Metabolic Panel:  Recent Labs Lab 06/07/14 0500 06/08/14 0548  NA 134* 139  K 3.7 3.1*  CL 94* 101  CO2 24 26  GLUCOSE 93 96  BUN 19 12  CREATININE 1.32* 0.89  CALCIUM 8.2* 8.0*   Liver Function Tests: No results for input(s): AST, ALT, ALKPHOS, BILITOT, PROT, ALBUMIN in the last 168 hours. No results for input(s): LIPASE, AMYLASE in the last 168 hours. No results for input(s): AMMONIA in the last 168 hours. CBC:  Recent Labs Lab 06/07/14 2000 06/08/14 0548  WBC 9.0 8.4  HGB 10.0* 9.8*  HCT 30.9* 31.4*  MCV 77.4* 77.7*  PLT 201 199   Cardiac Enzymes: No results for input(s): CKTOTAL, CKMB, CKMBINDEX, TROPONINI in the last 168 hours. BNP: Invalid input(s): POCBNP CBG:  Recent Labs Lab 06/03/14 1537 06/04/14 0237 06/06/14 1930 06/06/14 2323 06/07/14 0400  GLUCAP 134* 132* 129* 117* 127*     Micro Results: Recent Results (from the past 240 hour(s))  MRSA PCR Screening     Status: None   Collection Time: 05/31/14  8:56 PM  Result Value Ref Range Status   MRSA by PCR NEGATIVE NEGATIVE Final    Comment:        The GeneXpert MRSA Assay (FDA approved for NASAL specimens only), is  one component of a comprehensive MRSA colonization surveillance program. It is not intended to diagnose MRSA infection nor to guide or monitor treatment for MRSA infections.     Studies/Results: Ct Abdomen Pelvis W Contrast  05/31/2014   ADDENDUM REPORT: 05/31/2014 16:27  ADDENDUM: Study discussed by telephone with Dr. Scot Jun On 05/31/2014 at 1625 hrs.   Electronically Signed   By: Lars Pinks M.D.   On: 05/31/2014 16:27   05/31/2014   CLINICAL DATA:  80 year old female with right side abdominal pain, severe in constant for 2 days. Diarrhea. Initial encounter.  EXAM: CT ABDOMEN AND PELVIS WITH CONTRAST  TECHNIQUE: Multidetector CT imaging of the abdomen and pelvis was performed using the standard protocol following bolus administration of intravenous contrast.  CONTRAST:  70mL OMNIPAQUE IOHEXOL 300 MG/ML  SOLN  COMPARISON:  04/28/2014 and earlier.  FINDINGS: Negative lung bases. Mild cardiomegaly. No pericardial or pleural effusion.  Stable visualized osseous structures.  Moderate volume of free fluid in the pelvis is new. Decompressed bladder. Negative rectum. Sigmoid diverticulosis re - identified, no definite adjacent mesenteric inflammation.  Decompressed left colon, transverse colon, at hepatic flexure.  Soft tissue mass like thickening at the ileocecal valve. The cecum is largely decompressed, but the small bowel is been diffusely dilated distally, with a gradual transition to more normal caliber proximal small bowel. The mass like soft tissue appears to affect both this cecum and terminal ileum, measuring up to 6 cm in length, by up to 4 cm in thickness. Pericecal lymph nodes are identified and appear stable small distal small bowel mesenteric nodes up to 8 mm short axis.  In addition to free fluid in the pelvis and a small bowel mesentery there is a small volume of fluid along the liver. No liver mass identified. Surgically absent gallbladder as before.  Small volume perisplenic fluid.  Spleen, pancreas, adrenal glands, and kidneys are stable. Portal venous system is patent. Major arterial structures in the abdomen and pelvis are patent with aortoiliac calcified atherosclerosis noted. No free air. Moderate volume gastric paraesophageal or hiatal hernia contains contrast today. This has not significantly changed.  IMPRESSION: Acute small bowel obstruction due to what appears to be a soft tissue mass (apple core lesion) at the ileocecal valve suspicious for intestinal adenocarcinoma. Up to 8 mm pericecal lymph nodes are stable. No liver or distant metastasis identified. Small volume free fluid in the abdomen and pelvis, favor reactive. No free air.  Electronically Signed: By: Lars Pinks M.D. On: 05/31/2014 16:16   Dg Chest Port 1 View  06/07/2014   CLINICAL DATA:  Dyspnea  EXAM: PORTABLE CHEST - 1 VIEW  COMPARISON:  December 27, 2013  FINDINGS: There is mild atelectatic change in the right base. Elsewhere lungs are clear. Heart is upper normal in size with pulmonary vascularity within normal limits. No pneumothorax. No adenopathy. There is postoperative change in the lower cervical spine region.  IMPRESSION: Mild atelectasis right base.  No edema or consolidation.   Electronically Signed   By: Lowella Grip M.D.   On: 06/07/2014  09:46   Dg Abd 2 Views  06/02/2014   CLINICAL DATA:  Upper abdominal pain and constipation. Small bowel obstruction.  EXAM: ABDOMEN - 2 VIEW  COMPARISON:  05/31/2014  FINDINGS: Previously administered enteric contrast is now seen extending through the colon. There is persistent moderate gas distention of several loops of small bowel with index loop within the left mid hemiabdomen measuring 3.9 cm diameter. No pneumoperitoneum, pneumatosis or portal venous gas.  Post cholecystectomy.  Moderate scoliotic curvature of the thoracolumbar spine with associated moderate to severe multilevel DDD, incompletely evaluated.  IMPRESSION: Findings suggestive of improved though  persistent partial small bowel obstruction. Continued attention on follow-up is recommended.   Electronically Signed   By: Sandi Mariscal M.D.   On: 06/02/2014 09:11    Medications: Scheduled Meds: . antiseptic oral rinse  7 mL Mouth Rinse q12n4p  . baclofen  10 mg Oral TID  . benazepril  20 mg Oral Daily  . chlorhexidine  15 mL Mouth Rinse BID  . cholecalciferol  1,000 Units Oral Daily  . ciprofloxacin  400 mg Intravenous Q12H  . feeding supplement (PRO-STAT SUGAR FREE 64)  30 mL Oral TID WC  . gabapentin  600 mg Oral Daily  . heparin  5,000 Units Subcutaneous 3 times per day  . metronidazole  500 mg Intravenous Q8H  . sertraline  50 mg Oral Daily  . sodium chloride  3 mL Intravenous Q12H      LOS: 8 days   RAI,RIPUDEEP M.D. [facility] 06/08/2014, 1:25 PM Pager: [phone]  If 7PM-7AM, please contact night-coverage [email or web address] Password [email or web address]

## 2014-06-08 NOTE — Consult Note (Addendum)
WOC ostomy follow up Stoma type/location:  Colostomy stoma to left lower quad Stomal assessment/size: Stoma red and viable, slightly above skin level, 11/4 inches Peristomal assessment: Intact skin surrounding Output 50cc semi-formed brown stool Ostomy pouching: 1pc. with barrier ring Education provided:  Applied one piece pouch with barrier ring to maintain seal.  Patient was able to assist with applying the barrier ring and pouch, and can open and close velcro to empty.  Adb binder re-applied over ostomy which has an opening cut for the pouch. She asked appropriate questions and discussed ordering supplies.  No family members at bedside for teaching session.  Supplies and educational materials left at bedside.  Recommend home health assistance after discharge. Enrolled patient in [place] Start Discharge program: Yes Julien Girt MSN, RN, [place], [place], [place]

## 2014-06-08 NOTE — Evaluation (Signed)
Occupational Therapy Evaluation and Discharge Patient Details Name: [name] MRN: [id number] DOB: 1930/04/17 Today's Date: 06/08/2014    History of Present Illness  80 year old female with chronic anemia, chronic diastolic CHF, colovaginal fistula and diverticulosis presented with abdominal pain. She reported she has chronic abdominal pain which was getting worse for the past couple of weeks, nausea, vomiting but no severe weight loss.    Clinical Impression   This 80 yo female admitted with above presents to acute OT with decreased bend in Bil knees, decreased mobility, decreased balance, increased pain, colostomy back all affecting her ability to care for herself at home. She will benefit from continued OT at SNF, acute OT will sign off.    Follow Up Recommendations  SNF    Equipment Recommendations   (TBD at next venue)       Precautions / Restrictions Precautions Precautions: Fall Precaution Comments: pt with ostomy  Required Braces or Orthoses: Other Brace/Splint Other Brace/Splint: abdominal binder  Restrictions Weight Bearing Restrictions: No      Mobility Bed Mobility Overal bed mobility: Needs Assistance Bed Mobility: Supine to Sit     Supine to sit: Supervision;HOB elevated (and use of rail)    Transfers Overall transfer level: Needs assistance Equipment used: Rolling walker (2 wheeled) Transfers: Sit to/from Stand Sit to Stand: Min guard                 ADL Overall ADL's : Needs assistance/impaired Eating/Feeding: Independent;Sitting   Grooming: Wash/dry hands;Min guard;Standing   Upper Body Bathing: Set up;Sitting   Lower Body Bathing: Moderate assistance (with min guard A sit<>stand)   Upper Body Dressing : Set up;Sitting   Lower Body Dressing: Moderate assistance (with min guard A sit<>stand)   Toilet Transfer: Minimal assistance;Ambulation;RW;Comfort height toilet;Grab bars   Toileting- Clothing Manipulation and Hygiene: Min  guard;Sit to/from stand                         Pertinent Vitals/Pain Pain Assessment: 0-10 Pain Score: 2  Pain Location: abdomen Pain Descriptors / Indicators: Sore Pain Intervention(s): Monitored during session     Hand Dominance  Right   Extremity/Trunk Assessment Upper Extremity Assessment Upper Extremity Assessment: Overall WFL for tasks assessed              Cognition Arousal/Alertness: Awake/alert Behavior During Therapy: WFL for tasks assessed/performed Overall Cognitive Status: Within Functional Limits for tasks assessed                                Home Living Family/patient expects to be discharged to:: Skilled nursing facility                                             OT Diagnosis: Generalized weakness;Acute pain   OT Problem List: Decreased strength;Decreased range of motion;Impaired balance (sitting and/or standing);Pain;Decreased knowledge of use of DME or AE      OT Goals(Current goals can be found in the care plan section) Acute Rehab OT Goals Patient Stated Goal: to go home  OT Frequency:                End of Session Equipment Utilized During Treatment: Gait belt;Rolling walker  Activity Tolerance: Patient tolerated treatment well Patient left: in chair;with call bell/phone within  reach;with chair alarm set   Time: 1416-1436 OT Time Calculation (min): 20 min Charges:  OT General Charges $OT Visit: 1 Procedure OT Evaluation $Initial OT Evaluation Tier I: 1 Procedure OT Treatments $Self Care/Home Management : 8-22 mins  Almon Register [phone] 06/08/2014, 2:46 PM

## 2014-06-08 NOTE — Progress Notes (Signed)
Physical Therapy Treatment Patient Details Name: [name] MRN: [id number] DOB: [birth year] Today's Date: 06/08/2014    History of Present Illness  80 year old female with chronic anemia, chronic diastolic CHF, colovaginal fistula and diverticulosis presented with abdominal pain. She reported she has chronic abdominal pain which was getting worse for the past couple of weeks, nausea, vomiting but no severe weight loss.     PT Comments    Patient is making some progress with ambulation this session. She had been sitting up most of morning and was requesting to get back into bed after ambulation. Limited by faitgue this session but overall showing some progress. Will continue to recommend SNF at this time.   Follow Up Recommendations  SNF;Supervision/Assistance - 24 hour     Equipment Recommendations  Rolling walker with 5" wheels    Recommendations for Other Services       Precautions / Restrictions Precautions Precautions: Fall Precaution Comments: pt with ostomy  Required Braces or Orthoses: Other Brace/Splint Other Brace/Splint: abdominal binder     Mobility  Bed Mobility Overal bed mobility: Needs Assistance Bed Mobility: Sit to Supine       Sit to supine: Min assist   General bed mobility comments: verbal cues for sequencing. Min A for LEs back into bed  Transfers Overall transfer level: Needs assistance Equipment used: Rolling walker (2 wheeled)   Sit to Stand: Min assist         General transfer comment: cues for hand placement, assist to power up  Ambulation/Gait Ambulation/Gait assistance: Min assist Ambulation Distance (Feet): 45 Feet Assistive device: Rolling walker (2 wheeled) Gait Pattern/deviations: Step-through pattern;Decreased stride length Gait velocity: decreased   General Gait Details: Pt fatigues quickly.  Knee buckling during gait. Chair to follow for safety and to push ambulation   Stairs            Wheelchair Mobility     Modified Rankin (Stroke Patients Only)       Balance                                    Cognition Arousal/Alertness: Awake/alert Behavior During Therapy: WFL for tasks assessed/performed Overall Cognitive Status: Within Functional Limits for tasks assessed                      Exercises      General Comments        Pertinent Vitals/Pain Pain Score: 5  Pain Location: abdomen Pain Descriptors / Indicators: Burning;Sore Pain Intervention(s): Monitored during session    Home Living                      Prior Function            PT Goals (current goals can now be found in the care plan section) Progress towards PT goals: Progressing toward goals    Frequency  Min 3X/week    PT Plan Current plan remains appropriate    Co-evaluation             End of Session Equipment Utilized During Treatment: Gait belt Activity Tolerance: Patient tolerated treatment well;Patient limited by fatigue Patient left: in bed;with call bell/phone within reach     Time: 1143-1200 PT Time Calculation (min) (ACUTE ONLY): 17 min  Charges:  $Gait Training: 8-22 mins  G Codes:      [name] 06/08/2014, 1:59 PM 06/08/2014 [name] PTA [phone] pager [phone] office

## 2014-06-09 LAB — BASIC METABOLIC PANEL
Anion gap: 12 (ref 5–15)
BUN: 12 mg/dL (ref 6–23)
CO2: 25 mEq/L (ref 19–32)
Calcium: 7.8 mg/dL — ABNORMAL LOW (ref 8.4–10.5)
Chloride: 102 mEq/L (ref 96–112)
Creatinine, Ser: 0.88 mg/dL (ref 0.50–1.10)
GFR calc Af Amer: 69 mL/min — ABNORMAL LOW (ref >90)
GFR calc non Af Amer: 60 mL/min — ABNORMAL LOW (ref >90)
Glucose, Bld: 94 mg/dL (ref 70–99)
Potassium: 3.2 mEq/L — ABNORMAL LOW (ref 3.7–5.3)
Sodium: 139 mEq/L (ref 137–147)

## 2014-06-09 LAB — CBC
HCT: 30.5 % — ABNORMAL LOW (ref 36.0–46.0)
Hemoglobin: 9.6 g/dL — ABNORMAL LOW (ref 12.0–15.0)
MCH: 24.8 pg — ABNORMAL LOW (ref 26.0–34.0)
MCHC: 31.5 g/dL (ref 30.0–36.0)
MCV: 78.8 fL (ref 78.0–100.0)
Platelets: 209 10*3/uL (ref 150–400)
RBC: 3.87 MIL/uL (ref 3.87–5.11)
RDW: 21.4 % — ABNORMAL HIGH (ref 11.5–15.5)
WBC: 7.5 10*3/uL (ref 4.0–10.5)

## 2014-06-09 MED ORDER — CIPROFLOXACIN HCL 500 MG PO TABS
500.0000 mg | ORAL_TABLET | Freq: Two times a day (BID) | ORAL | Status: DC
  Administered 2014-06-09 – 2014-06-10 (×3): 500 mg via ORAL
  Filled 2014-06-09 (×5): qty 1

## 2014-06-09 MED ORDER — METRONIDAZOLE 500 MG PO TABS
500.0000 mg | ORAL_TABLET | Freq: Three times a day (TID) | ORAL | Status: DC
  Administered 2014-06-09 – 2014-06-10 (×3): 500 mg via ORAL
  Filled 2014-06-09 (×6): qty 1

## 2014-06-09 MED ORDER — POTASSIUM CHLORIDE 10 MEQ/100ML IV SOLN
10.0000 meq | INTRAVENOUS | Status: DC
  Administered 2014-06-09: 10 meq via INTRAVENOUS
  Filled 2014-06-09: qty 100

## 2014-06-09 MED ORDER — POTASSIUM CHLORIDE CRYS ER 20 MEQ PO TBCR
40.0000 meq | EXTENDED_RELEASE_TABLET | Freq: Every day | ORAL | Status: DC
  Administered 2014-06-09 – 2014-06-10 (×2): 40 meq via ORAL
  Filled 2014-06-09 (×2): qty 2

## 2014-06-09 NOTE — Progress Notes (Signed)
Patient ID: [name]  female  FMB:[id number]    DOB: 04/26/1930    DOA: 05/31/2014  PCP: Nyoka Cowden, MD   Brief history of present illness Patient is a 80 year old female with chronic anemia, chronic diastolic CHF, colovaginal fistula and diverticulosis presented with abdominal pain. She reported she has chronic abdominal pain which was getting worse for the past couple of weeks, nausea, vomiting but no severe weight loss. In the ED, CT scan of the abdomen and pelvis showed 6 cm apple core lesion involving the ED was cecal valve concerning for colon cancer. Patient was admitted for further workup. Patient underwent exploratory laparotomy, rt hemicolectomy, descending colostomy 11/19. Biopsy positive for multifocal invasive adenocarcinoma.   Assessment/Plan: Principal Problem:   Colon tumor/ SBO: Postop day #7, status post exploratory laparotomy, rt hemicolectomy, descending colostomy  - CT finding of 6 cm apple core lesion concerning for colon cancer - surgery following, advance diet to soft today - biopsy positive for metastatic (to 2 lymph nodes) invasive adenocarcinoma  - Patient seen by oncology, recommended outpatient chemotherapy once recovered from the surgery, out patient f/u with Dr Burr Medico on 12/15  Active Problems: Mild acute on Chronic diastolic CHF - 2-D echo in 2011 showed EF 60% with grade 1 diastolic dysfunction -I's and O's with 3.5L positive, continue Lasix, 40 mg daily  Hypokalemia: Likely due to Lasix, replaced  Anemia: Acute on chronic, microcytic iron deficiency anemia likely due to chronic blood loss and colon cancer - Transfused 2 units packed RBCs postoperatively - Hemoglobin down to 7.9 on 11/24, but transfuse 2 units packed RBCs  Leukocytosis: Improving, no fevers or chills   Generalized debility - skilled nursing therapy and rehabilitation per PT OT  DVT Prophylaxis: heparin subcutaneous  Code Status: full code  Family Communication: d/w  daughter, Ms Jasmine Pang  Disposition: Likely to be [place] tomorrow to skilled nursing facility if doing well  Consultants:  Surgery  Gastroenterology  Oncology  Procedures: exploratory laparotomy, rt hemicolectomy, descending colostomy 11/19   Antibiotics: IV Cefotan   Subjective: Patient seen and examined, feels good today, advanced diet, hoping to be discharged tomorrow   Objective: Weight change:   Intake/Output Summary (Last 24 hours) at 06/09/14 1029 Last data filed at 06/09/14 0300  Gross per 24 hour  Intake   1325 ml  Output    450 ml  Net    875 ml   Blood pressure 120/73, pulse 85, temperature 98.6 F (37 C), temperature source Oral, resp. rate 16, height 5\' 3"  (1.6 m), weight 84.8 kg (186 lb 15.2 oz), SpO2 96 %.  Physical Exam: General: A x O x 3 CVS: S1-S2 clear Chest: CTAB Abdomen: Soft, + bowel sounds, ostomy + Extremities: no c/c, trace edema    Lab Results: Basic Metabolic Panel:  Recent Labs Lab 06/08/14 0548 06/09/14 0350  NA 139 139  K 3.1* 3.2*  CL 101 102  CO2 26 25  GLUCOSE 96 94  BUN 12 12  CREATININE 0.89 0.88  CALCIUM 8.0* 7.8*   Liver Function Tests: No results for input(s): AST, ALT, ALKPHOS, BILITOT, PROT, ALBUMIN in the last 168 hours. No results for input(s): LIPASE, AMYLASE in the last 168 hours. No results for input(s): AMMONIA in the last 168 hours. CBC:  Recent Labs Lab 06/08/14 0548 06/09/14 0350  WBC 8.4 7.5  HGB 9.8* 9.6*  HCT 31.4* 30.5*  MCV 77.7* 78.8  PLT 199 209   Cardiac Enzymes: No results for input(s): CKTOTAL, CKMB,  CKMBINDEX, TROPONINI in the last 168 hours. BNP: Invalid input(s): POCBNP CBG:  Recent Labs Lab 06/03/14 1537 06/04/14 0237 06/06/14 1930 06/06/14 2323 06/07/14 0400  GLUCAP 134* 132* 129* 117* 127*     Micro Results: Recent Results (from the past 240 hour(s))  MRSA PCR Screening     Status: None   Collection Time: 05/31/14  8:56 PM  Result Value Ref Range Status   MRSA  by PCR NEGATIVE NEGATIVE Final    Comment:        The GeneXpert MRSA Assay (FDA approved for NASAL specimens only), is one component of a comprehensive MRSA colonization surveillance program. It is not intended to diagnose MRSA infection nor to guide or monitor treatment for MRSA infections.     Studies/Results: Ct Abdomen Pelvis W Contrast  05/31/2014   ADDENDUM REPORT: 05/31/2014 16:27  ADDENDUM: Study discussed by telephone with Dr. Scot Jun On 05/31/2014 at 1625 hrs.   Electronically Signed   By: Lars Pinks M.D.   On: 05/31/2014 16:27   05/31/2014   CLINICAL DATA:  80 year old female with right side abdominal pain, severe in constant for 2 days. Diarrhea. Initial encounter.  EXAM: CT ABDOMEN AND PELVIS WITH CONTRAST  TECHNIQUE: Multidetector CT imaging of the abdomen and pelvis was performed using the standard protocol following bolus administration of intravenous contrast.  CONTRAST:  56mL OMNIPAQUE IOHEXOL 300 MG/ML  SOLN  COMPARISON:  04/28/2014 and earlier.  FINDINGS: Negative lung bases. Mild cardiomegaly. No pericardial or pleural effusion.  Stable visualized osseous structures.  Moderate volume of free fluid in the pelvis is new. Decompressed bladder. Negative rectum. Sigmoid diverticulosis re - identified, no definite adjacent mesenteric inflammation.  Decompressed left colon, transverse colon, at hepatic flexure.  Soft tissue mass like thickening at the ileocecal valve. The cecum is largely decompressed, but the small bowel is been diffusely dilated distally, with a gradual transition to more normal caliber proximal small bowel. The mass like soft tissue appears to affect both this cecum and terminal ileum, measuring up to 6 cm in length, by up to 4 cm in thickness. Pericecal lymph nodes are identified and appear stable small distal small bowel mesenteric nodes up to 8 mm short axis.  In addition to free fluid in the pelvis and a small bowel mesentery there is a small volume of  fluid along the liver. No liver mass identified. Surgically absent gallbladder as before.  Small volume perisplenic fluid. Spleen, pancreas, adrenal glands, and kidneys are stable. Portal venous system is patent. Major arterial structures in the abdomen and pelvis are patent with aortoiliac calcified atherosclerosis noted. No free air. Moderate volume gastric paraesophageal or hiatal hernia contains contrast today. This has not significantly changed.  IMPRESSION: Acute small bowel obstruction due to what appears to be a soft tissue mass (apple core lesion) at the ileocecal valve suspicious for intestinal adenocarcinoma. Up to 8 mm pericecal lymph nodes are stable. No liver or distant metastasis identified. Small volume free fluid in the abdomen and pelvis, favor reactive. No free air.  Electronically Signed: By: Lars Pinks M.D. On: 05/31/2014 16:16   Dg Chest Port 1 View  06/07/2014   CLINICAL DATA:  Dyspnea  EXAM: PORTABLE CHEST - 1 VIEW  COMPARISON:  December 27, 2013  FINDINGS: There is mild atelectatic change in the right base. Elsewhere lungs are clear. Heart is upper normal in size with pulmonary vascularity within normal limits. No pneumothorax. No adenopathy. There is postoperative change in the lower cervical spine  region.  IMPRESSION: Mild atelectasis right base.  No edema or consolidation.   Electronically Signed   By: Lowella Grip M.D.   On: 06/07/2014 09:46   Dg Abd 2 Views  06/02/2014   CLINICAL DATA:  Upper abdominal pain and constipation. Small bowel obstruction.  EXAM: ABDOMEN - 2 VIEW  COMPARISON:  05/31/2014  FINDINGS: Previously administered enteric contrast is now seen extending through the colon. There is persistent moderate gas distention of several loops of small bowel with index loop within the left mid hemiabdomen measuring 3.9 cm diameter. No pneumoperitoneum, pneumatosis or portal venous gas.  Post cholecystectomy.  Moderate scoliotic curvature of the thoracolumbar spine with  associated moderate to severe multilevel DDD, incompletely evaluated.  IMPRESSION: Findings suggestive of improved though persistent partial small bowel obstruction. Continued attention on follow-up is recommended.   Electronically Signed   By: Sandi Mariscal M.D.   On: 06/02/2014 09:11    Medications: Scheduled Meds: . antiseptic oral rinse  7 mL Mouth Rinse q12n4p  . baclofen  10 mg Oral TID  . benazepril  20 mg Oral Daily  . chlorhexidine  15 mL Mouth Rinse BID  . cholecalciferol  1,000 Units Oral Daily  . ciprofloxacin  400 mg Intravenous Q12H  . feeding supplement (PRO-STAT SUGAR FREE 64)  30 mL Oral TID WC  . furosemide  40 mg Oral Daily  . gabapentin  600 mg Oral Daily  . heparin  5,000 Units Subcutaneous 3 times per day  . metronidazole  500 mg Intravenous Q8H  . sertraline  50 mg Oral Daily  . sodium chloride  3 mL Intravenous Q12H      LOS: 9 days   RAI,RIPUDEEP M.D. [facility] 06/09/2014, 10:29 AM Pager: [phone]  If 7PM-7AM, please contact night-coverage [email or web address] Password [email or web address]

## 2014-06-09 NOTE — Progress Notes (Signed)
Patient ID: [name], female   DOB: [birth year], 80 y.o.   MRN: [id number] [facility] Surgery Progress Note:   7 Days Post-Op  Subjective: Mental status is clear.  Feeling much better.   Objective: Vital signs in last 24 hours: Temp:  [98.4 F (36.9 C)-98.9 F (37.2 C)] 98.6 F (37 C) (11/26 0620) Pulse Rate:  [85-97] 85 (11/26 0620) Resp:  [16-18] 16 (11/26 0620) BP: (106-131)/(58-73) 120/73 mmHg (11/26 0620) SpO2:  [95 %-97 %] 96 % (11/26 0620)  Intake/Output from previous day: 11/25 0701 - 11/26 0700 In: 1325 [I.V.:1325] Out: 850 [Urine:700; Stool:150] Intake/Output this shift:    Physical Exam: Work of breathing is not labored.  Abdomen is soft and nontender.    Lab Results:  Results for orders placed or performed during the hospital encounter of 05/31/14 (from the past 48 hour(s))  Type and screen     Status: None   Collection Time: 06/07/14  9:48 AM  Result Value Ref Range   ABO/RH(D) A POS    Antibody Screen NEG    Sample Expiration 06/10/2014    Unit Number [id number]    Blood Component Type RED CELLS,LR    Unit division 00    Status of Unit ISSUED,FINAL    Transfusion Status OK TO TRANSFUSE    Crossmatch Result Compatible    Unit Number [id number]    Blood Component Type RED CELLS,LR    Unit division 00    Status of Unit ISSUED,FINAL    Transfusion Status OK TO TRANSFUSE    Crossmatch Result Compatible   Prepare RBC     Status: None   Collection Time: 06/07/14  9:48 AM  Result Value Ref Range   Order Confirmation ORDER PROCESSED BY BLOOD BANK   CBC     Status: Abnormal   Collection Time: 06/07/14  8:00 PM  Result Value Ref Range   WBC 9.0 4.0 - 10.5 K/uL   RBC 3.99 3.87 - 5.11 MIL/uL   Hemoglobin 10.0 (L) 12.0 - 15.0 g/dL    Comment: POST TRANSFUSION SPECIMEN DELTA CHECK NOTED    HCT 30.9 (L) 36.0 - 46.0 %   MCV 77.4 (L) 78.0 - 100.0 fL   MCH 25.1 (L) 26.0 - 34.0 pg   MCHC 32.4 30.0 - 36.0 g/dL   RDW 20.8 (H) 11.5 - 15.5 %   Platelets 201 150 - 400 K/uL  CBC     Status: Abnormal   Collection Time: 06/08/14  5:48 AM  Result Value Ref Range   WBC 8.4 4.0 - 10.5 K/uL   RBC 4.04 3.87 - 5.11 MIL/uL   Hemoglobin 9.8 (L) 12.0 - 15.0 g/dL    Comment: CONSISTENT WITH PREVIOUS RESULT   HCT 31.4 (L) 36.0 - 46.0 %   MCV 77.7 (L) 78.0 - 100.0 fL    Comment: CONSISTENT WITH PREVIOUS RESULT   MCH 24.3 (L) 26.0 - 34.0 pg   MCHC 31.2 30.0 - 36.0 g/dL   RDW 21.2 (H) 11.5 - 15.5 %    Comment: CONSISTENT WITH PREVIOUS RESULT   Platelets 199 150 - 400 K/uL  Basic metabolic panel     Status: Abnormal   Collection Time: 06/08/14  5:48 AM  Result Value Ref Range   Sodium 139 137 - 147 mEq/L   Potassium 3.1 (L) 3.7 - 5.3 mEq/L   Chloride 101 96 - 112 mEq/L   CO2 26 19 - 32 mEq/L   Glucose, Bld 96 70 - 99 mg/dL  BUN 12 6 - 23 mg/dL   Creatinine, Ser 0.89 0.50 - 1.10 mg/dL   Calcium 8.0 (L) 8.4 - 10.5 mg/dL   GFR calc non Af Amer 59 (L) >90 mL/min   GFR calc Af Amer 68 (L) >90 mL/min    Comment: (NOTE) The eGFR has been calculated using the CKD EPI equation. This calculation has not been validated in all clinical situations. eGFR's persistently <90 mL/min signify possible Chronic Kidney Disease.    Anion gap 12 5 - 15  CBC     Status: Abnormal   Collection Time: 06/09/14  3:50 AM  Result Value Ref Range   WBC 7.5 4.0 - 10.5 K/uL   RBC 3.87 3.87 - 5.11 MIL/uL   Hemoglobin 9.6 (L) 12.0 - 15.0 g/dL   HCT 30.5 (L) 36.0 - 46.0 %   MCV 78.8 78.0 - 100.0 fL   MCH 24.8 (L) 26.0 - 34.0 pg   MCHC 31.5 30.0 - 36.0 g/dL   RDW 21.4 (H) 11.5 - 15.5 %   Platelets 209 150 - 400 K/uL  Basic metabolic panel     Status: Abnormal   Collection Time: 06/09/14  3:50 AM  Result Value Ref Range   Sodium 139 137 - 147 mEq/L   Potassium 3.2 (L) 3.7 - 5.3 mEq/L   Chloride 102 96 - 112 mEq/L   CO2 25 19 - 32 mEq/L   Glucose, Bld 94 70 - 99 mg/dL   BUN 12 6 - 23 mg/dL   Creatinine, Ser 0.88 0.50 - 1.10 mg/dL   Calcium 7.8 (L) 8.4 -  10.5 mg/dL   GFR calc non Af Amer 60 (L) >90 mL/min   GFR calc Af Amer 69 (L) >90 mL/min    Comment: (NOTE) The eGFR has been calculated using the CKD EPI equation. This calculation has not been validated in all clinical situations. eGFR's persistently <90 mL/min signify possible Chronic Kidney Disease.    Anion gap 12 5 - 15    Radiology/Results: Dg Chest Port 1 View  06/07/2014   CLINICAL DATA:  Dyspnea  EXAM: PORTABLE CHEST - 1 VIEW  COMPARISON:  December 27, 2013  FINDINGS: There is mild atelectatic change in the right base. Elsewhere lungs are clear. Heart is upper normal in size with pulmonary vascularity within normal limits. No pneumothorax. No adenopathy. There is postoperative change in the lower cervical spine region.  IMPRESSION: Mild atelectasis right base.  No edema or consolidation.   Electronically Signed   By: Lowella Grip M.D.   On: 06/07/2014 09:46    Anti-infectives: Anti-infectives    Start     Dose/Rate Route Frequency Ordered Stop   06/06/14 1200  ciprofloxacin (CIPRO) IVPB 400 mg     400 mg200 mL/hr over 60 Minutes Intravenous Every 12 hours 06/06/14 1114     06/06/14 1200  metroNIDAZOLE (FLAGYL) IVPB 500 mg     500 mg100 mL/hr over 60 Minutes Intravenous Every 8 hours 06/06/14 1114     06/02/14 2200  cefoTEtan (CEFOTAN) 1 g in dextrose 5 % 50 mL IVPB  Status:  Discontinued     1 g100 mL/hr over 30 Minutes Intravenous Every 12 hours 06/02/14 1836 06/06/14 1114   06/02/14 0600  [MAR Hold]  cefoTEtan (CEFOTAN) 2 g in dextrose 5 % 50 mL IVPB     (MAR Hold since 06/02/14 0935)   2 g100 mL/hr over 30 Minutes Intravenous On call to O.R. 06/01/14 1519 06/02/14 1047  Assessment/Plan: Problem List: Patient Active Problem List   Diagnosis Date Noted  . Small bowel obstruction 05/31/2014  . Colon tumor 05/31/2014  . Colovaginal fistula 04/20/2014  . Weakness 12/27/2013  . Asterixis 12/21/2013  . Hypoxia 09/15/2013  . Lethargy 09/15/2013  . Hypotension,  unspecified 09/15/2013  . Dehydration 09/15/2013  . Patella fracture 09/14/2013  . Chest pain 02/19/2011  . Pre-operative cardiovascular examination 02/19/2011  . DEPRESSION 02/07/2010  . EDEMA 11/13/2009  . DYSPNEA 11/13/2009  . DIASTOLIC HEART FAILURE, CHRONIC 02/27/2009  . PURE HYPERCHOLESTEROLEMIA 04/05/2008  . CORONARY ARTERY DISEASE 04/05/2008  . Anemia 06/30/2007  . DEGENERATIVE JOINT DISEASE 05/19/2007  . Essential hypertension 01/21/2007  . GERD 01/21/2007  . HIATAL HERNIA WITH REFLUX 01/21/2007  . Diverticulosis of large intestine 01/21/2007  . COLONIC POLYPS, HX OF 01/21/2007    Passing flatus.  Advance diet and observe.  7 Days Post-Op    LOS: 9 days   Matt B. Hassell Done, MD, [facility] [phone] beeper [phone]  06/09/2014 9:31 AM

## 2014-06-09 NOTE — Progress Notes (Signed)
New IV placed on patient this morning. IV infiltrated when IV potassium  started. Dr. Tana Coast paged. Agreed to leave IV out and switch patient over to oral antibiotics and potassium.

## 2014-06-10 LAB — BASIC METABOLIC PANEL
Anion gap: 10 (ref 5–15)
BUN: 16 mg/dL (ref 6–23)
CO2: 28 mEq/L (ref 19–32)
Calcium: 8.4 mg/dL (ref 8.4–10.5)
Chloride: 102 mEq/L (ref 96–112)
Creatinine, Ser: 0.81 mg/dL (ref 0.50–1.10)
GFR calc Af Amer: 76 mL/min — ABNORMAL LOW (ref >90)
GFR calc non Af Amer: 66 mL/min — ABNORMAL LOW (ref >90)
Glucose, Bld: 96 mg/dL (ref 70–99)
Potassium: 4.1 mEq/L (ref 3.7–5.3)
Sodium: 140 mEq/L (ref 137–147)

## 2014-06-10 MED ORDER — CIPROFLOXACIN HCL 500 MG PO TABS: 500.0000 mg | ORAL_TABLET | Freq: Two times a day (BID) | ORAL | Status: DC

## 2014-06-10 MED ORDER — SACCHAROMYCES BOULARDII 250 MG PO CAPS: 250.0000 mg | ORAL_CAPSULE | Freq: Two times a day (BID) | ORAL | Status: DC

## 2014-06-10 MED ORDER — POTASSIUM CHLORIDE CRYS ER 20 MEQ PO TBCR: 20.0000 meq | EXTENDED_RELEASE_TABLET | Freq: Every day | ORAL | Status: DC

## 2014-06-10 MED ORDER — POTASSIUM CHLORIDE CRYS ER 20 MEQ PO TBCR: 40.0000 meq | EXTENDED_RELEASE_TABLET | Freq: Every day | ORAL | Status: DC

## 2014-06-10 MED ORDER — HYDROCODONE-ACETAMINOPHEN 5-325 MG PO TABS: 1.0000 | ORAL_TABLET | Freq: Four times a day (QID) | ORAL | Status: DC | PRN

## 2014-06-10 MED ORDER — PRO-STAT SUGAR FREE PO LIQD: 30.0000 mL | Freq: Three times a day (TID) | ORAL | Status: DC

## 2014-06-10 MED ORDER — METOPROLOL TARTRATE 25 MG PO TABS: 12.5000 mg | ORAL_TABLET | Freq: Two times a day (BID) | ORAL | Status: DC

## 2014-06-10 MED ORDER — METRONIDAZOLE 500 MG PO TABS
500.0000 mg | ORAL_TABLET | Freq: Three times a day (TID) | ORAL | Status: DC
Start: 2014-06-10 — End: 2014-06-10

## 2014-06-10 MED ORDER — SACCHAROMYCES BOULARDII 250 MG PO CAPS
250.0000 mg | ORAL_CAPSULE | Freq: Two times a day (BID) | ORAL | Status: DC
  Filled 2014-06-10 (×2): qty 1

## 2014-06-10 MED ORDER — TRAMADOL HCL 50 MG PO TABS: 50.0000 mg | ORAL_TABLET | Freq: Three times a day (TID) | ORAL | Status: DC

## 2014-06-10 MED ORDER — LORAZEPAM 0.5 MG PO TABS: 0.5000 mg | ORAL_TABLET | Freq: Two times a day (BID) | ORAL | Status: DC | PRN

## 2014-06-10 MED ORDER — ACETAMINOPHEN 325 MG PO TABS: 650.0000 mg | ORAL_TABLET | Freq: Four times a day (QID) | ORAL | Status: DC | PRN

## 2014-06-10 MED ORDER — TRAZODONE HCL 100 MG PO TABS: 50.0000 mg | ORAL_TABLET | Freq: Every evening | ORAL | Status: DC | PRN

## 2014-06-10 NOTE — Progress Notes (Signed)
Patient ID: [name], female   DOB: [birth year], 80 y.o.   MRN: [id number] [facility] Surgery Progress Note:   8 Days Post-Op  Subjective: Mental status is clear with no complaints.  Taking diet Objective: Vital signs in last 24 hours: Temp:  [98.2 F (36.8 C)-98.3 F (36.8 C)] 98.3 F (36.8 C) (11/27 7681) Pulse Rate:  [80-97] 80 (11/27 0613) Resp:  [16] 16 (11/27 0613) BP: (111-130)/(62-81) 130/81 mmHg (11/27 0613) SpO2:  [96 %-99 %] 96 % (11/27 1572)  Intake/Output from previous day: 11/26 0701 - 11/27 0700 In: 637.5 [I.V.:637.5] Out: 1000 [Urine:700; Stool:300] Intake/Output this shift: Total I/O In: 240 [P.O.:240] Out: -   Physical Exam: Work of breathing is not labored.  Ostomy functioning well.  Abdominal binder in place.  Lower portion of wound with slight redness but nontender.  No drainage.  Will request Neopsorin daily until staple removal about 2 weeks from laparotomy.    Lab Results:  Results for orders placed or performed during the hospital encounter of 05/31/14 (from the past 48 hour(s))  CBC     Status: Abnormal   Collection Time: 06/09/14  3:50 AM  Result Value Ref Range   WBC 7.5 4.0 - 10.5 K/uL   RBC 3.87 3.87 - 5.11 MIL/uL   Hemoglobin 9.6 (L) 12.0 - 15.0 g/dL   HCT 30.5 (L) 36.0 - 46.0 %   MCV 78.8 78.0 - 100.0 fL   MCH 24.8 (L) 26.0 - 34.0 pg   MCHC 31.5 30.0 - 36.0 g/dL   RDW 21.4 (H) 11.5 - 15.5 %   Platelets 209 150 - 400 K/uL  Basic metabolic panel     Status: Abnormal   Collection Time: 06/09/14  3:50 AM  Result Value Ref Range   Sodium 139 137 - 147 mEq/L   Potassium 3.2 (L) 3.7 - 5.3 mEq/L   Chloride 102 96 - 112 mEq/L   CO2 25 19 - 32 mEq/L   Glucose, Bld 94 70 - 99 mg/dL   BUN 12 6 - 23 mg/dL   Creatinine, Ser 0.88 0.50 - 1.10 mg/dL   Calcium 7.8 (L) 8.4 - 10.5 mg/dL   GFR calc non Af Amer 60 (L) >90 mL/min   GFR calc Af Amer 69 (L) >90 mL/min    Comment: (NOTE) The eGFR has been calculated using the CKD EPI equation. This  calculation has not been validated in all clinical situations. eGFR's persistently <90 mL/min signify possible Chronic Kidney Disease.    Anion gap 12 5 - 15    Radiology/Results: No results found.  Anti-infectives: Anti-infectives    Start     Dose/Rate Route Frequency Ordered Stop   06/10/14 0000  ciprofloxacin (CIPRO) 500 MG tablet  Status:  Discontinued     500 mg Oral 2 times daily 06/10/14 0834 06/10/14    06/10/14 0000  metroNIDAZOLE (FLAGYL) 500 MG tablet  Status:  Discontinued     500 mg Oral Every 8 hours 06/10/14 0834 06/10/14    06/09/14 1400  metroNIDAZOLE (FLAGYL) tablet 500 mg  Status:  Discontinued     500 mg Oral 3 times per day 06/09/14 1231 06/10/14 0843   06/09/14 1245  ciprofloxacin (CIPRO) tablet 500 mg  Status:  Discontinued     500 mg Oral 2 times daily 06/09/14 1231 06/10/14 0843   06/06/14 1200  ciprofloxacin (CIPRO) IVPB 400 mg  Status:  Discontinued     400 mg200 mL/hr over 60 Minutes Intravenous Every 12 hours  06/06/14 1114 06/09/14 1231   06/06/14 1200  metroNIDAZOLE (FLAGYL) IVPB 500 mg  Status:  Discontinued     500 mg100 mL/hr over 60 Minutes Intravenous Every 8 hours 06/06/14 1114 06/09/14 1231   06/02/14 2200  cefoTEtan (CEFOTAN) 1 g in dextrose 5 % 50 mL IVPB  Status:  Discontinued     1 g100 mL/hr over 30 Minutes Intravenous Every 12 hours 06/02/14 1836 06/06/14 1114   06/02/14 0600  [MAR Hold]  cefoTEtan (CEFOTAN) 2 g in dextrose 5 % 50 mL IVPB     (MAR Hold since 06/02/14 0935)   2 g100 mL/hr over 30 Minutes Intravenous On call to O.R. 06/01/14 1519 06/02/14 1047      Assessment/Plan: Problem List: Patient Active Problem List   Diagnosis Date Noted  . Small bowel obstruction 05/31/2014  . Colon tumor 05/31/2014  . Colovaginal fistula 04/20/2014  . Weakness 12/27/2013  . Asterixis 12/21/2013  . Hypoxia 09/15/2013  . Lethargy 09/15/2013  . Hypotension, unspecified 09/15/2013  . Dehydration 09/15/2013  . Patella fracture 09/14/2013  .  Chest pain 02/19/2011  . Pre-operative cardiovascular examination 02/19/2011  . DEPRESSION 02/07/2010  . EDEMA 11/13/2009  . DYSPNEA 11/13/2009  . DIASTOLIC HEART FAILURE, CHRONIC 02/27/2009  . PURE HYPERCHOLESTEROLEMIA 04/05/2008  . CORONARY ARTERY DISEASE 04/05/2008  . Anemia 06/30/2007  . DEGENERATIVE JOINT DISEASE 05/19/2007  . Essential hypertension 01/21/2007  . GERD 01/21/2007  . HIATAL HERNIA WITH REFLUX 01/21/2007  . Diverticulosis of large intestine 01/21/2007  . COLONIC POLYPS, HX OF 01/21/2007    Discharge to [facility] today.   8 Days Post-Op    LOS: 10 days   Matt B. Hassell Done, MD, [facility] [phone] beeper [phone]  06/10/2014 9:33 AM

## 2014-06-10 NOTE — Discharge Instructions (Signed)
Apply Neosporin ointment to the staple line daily. Staple remove on or around Dec 3.

## 2014-06-10 NOTE — Consult Note (Addendum)
WOC ostomy follow up Stoma type/location: Colostomy stoma to left lower quad.  CCS following for assessment and plan of care to abd wound. Erythremia area to right abd, pt feels binder velcro was previously rubbing against site. Stomal assessment/size: Stoma red and viable, slightly above skin level, 11/4 inches Peristomal assessment: Intact skin surrounding Output 150cc semi-formed brown stool Ostomy pouching: 1pc. with barrier ring Education provided: Current pouch was leaking behind barrier r/t pouch was very full . Applied one piece pouch with barrier ring to maintain seal. Patient was able to assist with applying the barrier ring and pouch, and can open and close velcro to empty.Daughter at bedside for pouch change and she also is able to apply, open and close to empty.  Abd binder re-applied over ostomy which has an opening cut for the pouch. She asked appropriate questions and discussed ordering supplies. 2 sets of ostomy pouches and barrier rings and educational materials left at bedside. Pt plans to discharge to facility today where she will have assistance with pouching activities.  Daughter and pt deny further questions at this time. Enrolled patient in [place] Start Discharge program: Yes Julien Girt MSN, RN, [place], [place], [place]

## 2014-06-10 NOTE — Progress Notes (Signed)
Report called to [facility] 

## 2014-06-10 NOTE — Discharge Summary (Signed)
Physician Discharge Summary  Patient ID: [name] MRN: [id number] DOB/AGE: Nov 11, 1929 80 y.o.  Admit date: 05/31/2014 Discharge date: 06/10/2014  Primary Care Physician:  Nyoka Cowden, MD  Discharge Diagnoses:    . Small bowel obstruction Adenocarcinoma, colon cancer - new diagnosis . Essential hypertension . acute on chronic DIASTOLIC HEART FAILURE . Colovaginal fistula . Anemia   Consults:   Gen Surgery: Dr Georgette Dover GI: Dr Olevia Perches Oncology, Dr. Burr Medico   Recommendations for Outpatient Follow-up:  Neopsorin daily until staple removal about 2 weeks from laparotomy  Please check BMET, CBC on 06/13/14  Allergies:  No Known Allergies   Discharge Medications:   Medication List    STOP taking these medications        amoxicillin-clavulanate 875-125 MG per tablet  Commonly known as:  AUGMENTIN     benzonatate 100 MG capsule  Commonly known as:  TESSALON     spironolactone 25 MG tablet  Commonly known as:  ALDACTONE      TAKE these medications        acetaminophen 325 MG tablet  Commonly known as:  TYLENOL  Take 2 tablets (650 mg total) by mouth every 6 (six) hours as needed for mild pain (or Fever >/= 101).     baclofen 10 MG tablet  Commonly known as:  LIORESAL  Take 10 mg by mouth 3 (three) times daily.     benazepril 40 MG tablet  Commonly known as:  LOTENSIN  Take 0.5 tablets (20 mg total) by mouth daily.     cholecalciferol 1000 UNITS tablet  Commonly known as:  VITAMIN D  Take 1,000 Units by mouth daily.     feeding supplement (PRO-STAT SUGAR FREE 64) Liqd  Take 30 mLs by mouth 3 (three) times daily with meals.     ferrous sulfate 325 (65 FE) MG tablet  Take 1 tablet (325 mg total) by mouth 2 (two) times daily with a meal.     furosemide 20 MG tablet  Commonly known as:  LASIX  Take 20 mg by mouth daily.     gabapentin 600 MG tablet  Commonly known as:  NEURONTIN  Take 600 mg by mouth daily.     HYDROcodone-acetaminophen 5-325  MG per tablet  Commonly known as:  NORCO  Take 1-2 tablets by mouth every 6 (six) hours as needed for moderate pain.     LORazepam 0.5 MG tablet  Commonly known as:  ATIVAN  Take 1 tablet (0.5 mg total) by mouth 2 (two) times daily as needed for anxiety or sleep.     metoprolol tartrate 25 MG tablet  Commonly known as:  LOPRESSOR  Take 0.5 tablets (12.5 mg total) by mouth 2 (two) times daily.     multivitamin with minerals tablet  Take 1 tablet by mouth daily.     nitroGLYCERIN 0.4 MG SL tablet  Commonly known as:  NITROSTAT  Place 0.4 mg under the tongue every 5 (five) minutes as needed for chest pain.     potassium chloride SA 20 MEQ tablet  Commonly known as:  K-DUR,KLOR-CON  Take 1 tablet (20 mEq total) by mouth daily.     saccharomyces boulardii 250 MG capsule  Commonly known as:  FLORASTOR  Take 1 capsule (250 mg total) by mouth 2 (two) times daily.     sertraline 50 MG tablet  Commonly known as:  ZOLOFT  Take 50 mg by mouth daily.     simvastatin 40 MG tablet  Commonly known  as:  ZOCOR  Take 1 tablet (40 mg total) by mouth at bedtime.     traMADol 50 MG tablet  Commonly known as:  ULTRAM  Take 1 tablet (50 mg total) by mouth every 8 (eight) hours.     traZODone 100 MG tablet  Commonly known as:  DESYREL  Take 0.5 tablets (50 mg total) by mouth at bedtime as needed for sleep.     triamcinolone cream 0.1 %  Commonly known as:  KENALOG  Apply 1 application topically 2 (two) times daily as needed (Eczema on legs).     Vitamin C 500 MG Caps  Take 1 tablet by mouth daily.         Brief H and P: For complete details please refer to admission H and P, but in brief patient is a 80 year old female with history of chronic anemia, chronic diastolic CHF, colovaginal fistula and diverticulosis presented to the hospital with abdominal pain. Patient reported that she has chronic abdominal pain which was getting worse over the past couple of weeks before the admission.  She complained about nausea, vomiting on the day of admission but no severe weight loss. Patient was still passing flatness and having bowel movements. In ED, CT scan of the abdomen and pelvis showed 6 cm apple core lesion involving the iliocecal valve concerning for colon cancer. Surgery was consulted and patient was admitted for further workup.  Hospital Course:   Patient is a 80 year old female with chronic anemia, chronic diastolic CHF, colovaginal fistula and diverticulosis presented with abdominal pain. She reported she has chronic abdominal pain which was getting worse for the past couple of weeks, nausea, vomiting but no severe weight loss. In the ED, CT scan of the abdomen and pelvis showed 6 cm apple core lesion involving the ED was cecal valve concerning for colon cancer. Patient was admitted for further workup.  Gastroenterology and general surgery was consulted. Patient underwent exploratory laparotomy, rt hemicolectomy, descending colostomy 11/19. Biopsy positive for multifocal invasive adenocarcinoma.   Colon cancer adenocarcinoma/ SBO: Postop day #8, status post exploratory laparotomy, rt hemicolectomy, descending colostomy on 11/19. CT finding of 6 cm apple core lesion concerning for colon cancer, biopsies positive for metastatic (to 2 lymph nodes) invasive adenocarcinoma  Patient seen by oncology, recommended outpatient chemotherapy once recovered from the surgery, out patient f/u with Dr Burr Medico on 12/15 scheduled at [facility]. Patient is now tolerating oral diet without any difficulty, she will need colostomy care. Per surgery recommendations, Continue Neosporin daily until staples removal 2 weeks from laparotomy.  Mild acute on Chronic diastolic CHF: Likely due to fluid overload - 2-D echo in 2011 showed EF 60% with grade 1 diastolic dysfunction, patient was started on diuresis with Lasix Continue oral Lasix, patient is recovering from surgery, [place] add outpatient  spironolactone if needed.  Hypokalemia: Likely due to Lasix, replaced  Anemia: Acute on chronic, microcytic iron deficiency anemia likely due to chronic blood loss and colon cancer - Transfused 2 units packed RBCs postoperatively - Hemoglobin down to 7.9 on 11/24, but transfuse 2 units packed RBCs, hemoglobin stable at 9.6.  Leukocytosis: Resolved  Generalized debility- skilled nursing therapy and rehabilitation per PT OT   Day of Discharge BP 130/81 mmHg  Pulse 80  Temp(Src) 98.3 F (36.8 C) (Oral)  Resp 16  Ht 5\' 3"  (1.6 m)  Wt 84.8 kg (186 lb 15.2 oz)  BMI 33.13 kg/m2  SpO2 96%  Physical Exam: General: Alert and awake oriented x3  not in any acute distress. CVS: S1-S2 clear no murmur rubs or gallops Chest: clear to auscultation bilaterally, no wheezing rales or rhonchi Abdomen: soft nontender, nondistended, normal bowel sounds, + colostomy Extremities: no cyanosis, clubbing or edema noted bilaterally Neuro: Cranial nerves II-XII intact, no focal neurological deficits   The results of significant diagnostics from this hospitalization (including imaging, microbiology, ancillary and laboratory) are listed below for reference.    LAB RESULTS: Basic Metabolic Panel:  Recent Labs Lab 06/09/14 0350 06/10/14 0845  NA 139 140  K 3.2* 4.1  CL 102 102  CO2 25 28  GLUCOSE 94 96  BUN 12 16  CREATININE 0.88 0.81  CALCIUM 7.8* 8.4   Liver Function Tests: No results for input(s): AST, ALT, ALKPHOS, BILITOT, PROT, ALBUMIN in the last 168 hours. No results for input(s): LIPASE, AMYLASE in the last 168 hours. No results for input(s): AMMONIA in the last 168 hours. CBC:  Recent Labs Lab 06/08/14 0548 06/09/14 0350  WBC 8.4 7.5  HGB 9.8* 9.6*  HCT 31.4* 30.5*  MCV 77.7* 78.8  PLT 199 209   Cardiac Enzymes: No results for input(s): CKTOTAL, CKMB, CKMBINDEX, TROPONINI in the last 168 hours. BNP: Invalid input(s): POCBNP CBG:  Recent Labs Lab 06/06/14 2323  06/07/14 0400  GLUCAP 117* 127*    Significant Diagnostic Studies:  Ct Abdomen Pelvis W Contrast  05/31/2014   ADDENDUM REPORT: 05/31/2014 16:27  ADDENDUM: Study discussed by telephone with Dr. Scot Jun On 05/31/2014 at 1625 hrs.   Electronically Signed   By: Lars Pinks M.D.   On: 05/31/2014 16:27   05/31/2014   CLINICAL DATA:  80 year old female with right side abdominal pain, severe in constant for 2 days. Diarrhea. Initial encounter.  EXAM: CT ABDOMEN AND PELVIS WITH CONTRAST  TECHNIQUE: Multidetector CT imaging of the abdomen and pelvis was performed using the standard protocol following bolus administration of intravenous contrast.  CONTRAST:  38mL OMNIPAQUE IOHEXOL 300 MG/ML  SOLN  COMPARISON:  04/28/2014 and earlier.  FINDINGS: Negative lung bases. Mild cardiomegaly. No pericardial or pleural effusion.  Stable visualized osseous structures.  Moderate volume of free fluid in the pelvis is new. Decompressed bladder. Negative rectum. Sigmoid diverticulosis re - identified, no definite adjacent mesenteric inflammation.  Decompressed left colon, transverse colon, at hepatic flexure.  Soft tissue mass like thickening at the ileocecal valve. The cecum is largely decompressed, but the small bowel is been diffusely dilated distally, with a gradual transition to more normal caliber proximal small bowel. The mass like soft tissue appears to affect both this cecum and terminal ileum, measuring up to 6 cm in length, by up to 4 cm in thickness. Pericecal lymph nodes are identified and appear stable small distal small bowel mesenteric nodes up to 8 mm short axis.  In addition to free fluid in the pelvis and a small bowel mesentery there is a small volume of fluid along the liver. No liver mass identified. Surgically absent gallbladder as before.  Small volume perisplenic fluid. Spleen, pancreas, adrenal glands, and kidneys are stable. Portal venous system is patent. Major arterial structures in the abdomen and  pelvis are patent with aortoiliac calcified atherosclerosis noted. No free air. Moderate volume gastric paraesophageal or hiatal hernia contains contrast today. This has not significantly changed.  IMPRESSION: Acute small bowel obstruction due to what appears to be a soft tissue mass (apple core lesion) at the ileocecal valve suspicious for intestinal adenocarcinoma. Up to 8 mm pericecal lymph nodes are stable. No  liver or distant metastasis identified. Small volume free fluid in the abdomen and pelvis, favor reactive. No free air.  Electronically Signed: By: Lars Pinks M.D. On: 05/31/2014 16:16      Disposition and Follow-up: Discharge Instructions    Discharge instructions    Complete by:  As directed   Diet: SOFT, low sodium diet     Increase activity slowly    Complete by:  As directed             DISPOSITION: Skilled nursing facility  DIET: Soft diet heart healthy   DISCHARGE FOLLOW-UP Follow-up Information    Follow up with Truitt Merle, MD On 06/28/2014.   Specialties:  Hematology, Oncology   Why:  at 9:30 AM. , for hospital follow-up to discuss about chemotherapy    Contact information:   [place] [address] [phone]       Follow up with Maia Petties., MD. Schedule an appointment as soon as possible for a visit in 3 weeks.   Specialty:  General Surgery   Why:  for hospital follow-up. Please call on Monday 11/30 for the appointment date and time.     Contact information:   [address] [place] [address] [phone]       Time spent on Discharge: 38 mins  Signed:   Zsofia Prout M.D. [facility] 06/10/2014, 11:31 AM Pager: [phone]

## 2014-06-10 NOTE — Progress Notes (Signed)
Physical Therapy Treatment Patient Details Name: [name] MRN: [id number] DOB: Aug 11, 1929 Today's Date: 06/10/2014    History of Present Illness  80 year old female with chronic anemia, chronic diastolic CHF, colovaginal fistula and diverticulosis presented with abdominal pain. She reported she has chronic abdominal pain which was getting worse for the past couple of weeks, nausea, vomiting but no severe weight loss.     PT Comments    Patient able to increase ambulation slowly with some encouragement. Daughter present throughout session. Planning to DC to [facility] today  Follow Up Recommendations  SNF;Supervision/Assistance - 24 hour     Equipment Recommendations  Rolling walker with 5" wheels    Recommendations for Other Services       Precautions / Restrictions Precautions Precautions: Fall Precaution Comments: pt with ostomy  Required Braces or Orthoses: Other Brace/Splint Other Brace/Splint: abdominal binder     Mobility  Bed Mobility Overal bed mobility: Needs Assistance       Supine to sit: Supervision;HOB elevated     General bed mobility comments: verbal cues for sequencing.Use of rails  Transfers Overall transfer level: Needs assistance Equipment used: Rolling walker (2 wheeled)   Sit to Stand: Min assist         General transfer comment: cues for hand placement, assist to power up  Ambulation/Gait Ambulation/Gait assistance: Min assist Ambulation Distance (Feet): 60 Feet Assistive device: Rolling walker (2 wheeled) Gait Pattern/deviations: Step-through pattern;Decreased stride length Gait velocity: decreased   General Gait Details: Pt fatigues quickly.  Knee buckling during gait. min A for Rw management in tight spaces   Stairs            Wheelchair Mobility    Modified Rankin (Stroke Patients Only)       Balance                                    Cognition Arousal/Alertness: Awake/alert Behavior During  Therapy: WFL for tasks assessed/performed Overall Cognitive Status: Within Functional Limits for tasks assessed                      Exercises      General Comments        Pertinent Vitals/Pain Pain Assessment: No/denies pain    Home Living                      Prior Function            PT Goals (current goals can now be found in the care plan section) Progress towards PT goals: Progressing toward goals    Frequency  Min 3X/week    PT Plan Current plan remains appropriate    Co-evaluation             End of Session Equipment Utilized During Treatment: Gait belt Activity Tolerance: Patient tolerated treatment well;Patient limited by fatigue Patient left: in chair;with call bell/phone within reach     Time: 0911-0926 PT Time Calculation (min) (ACUTE ONLY): 15 min  Charges:  $Gait Training: 8-22 mins                    G Codes:      Jacqualyn Posey 06/10/2014, 12:59 PM 06/10/2014 Jacqualyn Posey PTA [phone] pager [phone] office

## 2014-06-13 ENCOUNTER — Encounter: Payer: Self-pay | Admitting: Internal Medicine

## 2014-06-13 ENCOUNTER — Non-Acute Institutional Stay (SKILLED_NURSING_FACILITY): Payer: Commercial Managed Care - HMO | Admitting: Internal Medicine

## 2014-06-13 ENCOUNTER — Other Ambulatory Visit: Payer: Self-pay | Admitting: *Deleted

## 2014-06-13 ENCOUNTER — Encounter (HOSPITAL_COMMUNITY): Payer: Self-pay

## 2014-06-13 DIAGNOSIS — F418 Other specified anxiety disorders: Secondary | ICD-10-CM

## 2014-06-13 DIAGNOSIS — I5032 Chronic diastolic (congestive) heart failure: Secondary | ICD-10-CM

## 2014-06-13 DIAGNOSIS — L089 Local infection of the skin and subcutaneous tissue, unspecified: Secondary | ICD-10-CM

## 2014-06-13 DIAGNOSIS — A499 Bacterial infection, unspecified: Secondary | ICD-10-CM

## 2014-06-13 DIAGNOSIS — B9689 Other specified bacterial agents as the cause of diseases classified elsewhere: Secondary | ICD-10-CM | POA: Insufficient documentation

## 2014-06-13 DIAGNOSIS — F419 Anxiety disorder, unspecified: Secondary | ICD-10-CM

## 2014-06-13 DIAGNOSIS — C182 Malignant neoplasm of ascending colon: Secondary | ICD-10-CM | POA: Insufficient documentation

## 2014-06-13 DIAGNOSIS — F32A Depression, unspecified: Secondary | ICD-10-CM

## 2014-06-13 DIAGNOSIS — G4700 Insomnia, unspecified: Secondary | ICD-10-CM

## 2014-06-13 DIAGNOSIS — R531 Weakness: Secondary | ICD-10-CM | POA: Insufficient documentation

## 2014-06-13 DIAGNOSIS — D62 Acute posthemorrhagic anemia: Secondary | ICD-10-CM | POA: Insufficient documentation

## 2014-06-13 DIAGNOSIS — M792 Neuralgia and neuritis, unspecified: Secondary | ICD-10-CM

## 2014-06-13 DIAGNOSIS — F329 Major depressive disorder, single episode, unspecified: Secondary | ICD-10-CM | POA: Insufficient documentation

## 2014-06-13 DIAGNOSIS — C189 Malignant neoplasm of colon, unspecified: Secondary | ICD-10-CM

## 2014-06-13 LAB — BASIC METABOLIC PANEL
BUN: 25 mg/dL — AB (ref 4–21)
Creatinine: 1.3 mg/dL — AB (ref 0.5–1.1)
Glucose: 82 mg/dL
Potassium: 4.3 mmol/L (ref 3.4–5.3)
Sodium: 139 mmol/L (ref 137–147)

## 2014-06-13 LAB — CBC AND DIFFERENTIAL
HCT: 36 % (ref 36–46)
Hemoglobin: 11 g/dL — AB (ref 12.0–16.0)
Platelets: 221 10*3/uL (ref 150–399)
WBC: 8.2 10^3/mL

## 2014-06-13 MED ORDER — HYDROCODONE-ACETAMINOPHEN 5-325 MG PO TABS: 1.0000 | ORAL_TABLET | Freq: Four times a day (QID) | ORAL | Status: DC | PRN

## 2014-06-13 NOTE — Telephone Encounter (Signed)
[facility] 

## 2014-06-13 NOTE — Progress Notes (Signed)
Patient ID: [name], female   DOB: May 08, 1930, 80 y.o.   MRN: [id number]     Facility: [facility]    PCP: Nyoka Cowden, MD  Code Status: full code  No Known Allergies  Chief Complaint  Patient presents with  . New Admit To SNF     HPI:  80 y/o female pt is here for STR post hospital admission from 05/31/14-06/10/14 with nausea and vomiting with abdominal pain. CT scan of the abdomen and pelvis showed 6 cm apple core lesion involving the iliocecal valve concerning for colon cancer. Surgery was consulted and she underwent exploratory laparotomy, right hemicolectomy, descending colostomy 11/19. Biopsy was positive for multifocal invasive adenocarcinoma. Oncology was consulted and outpatient chemotherapy is recommended. She required 2 u prbc in hospital She has PMH of chronic anemia, chronic diastolic CHF, colovaginal fistula and diverticulosis. She is seen in her room today. She is currently on mechanical soft diet and would like her diet to be changed to regular. She mentions that in the hospital she was getting regular food. Denies any nausea, vomiting, abdominal pain or dysphagia. Denies cough. She mentions getting nervous and having impending doom sensation at times ad feels her lorazepam helps her. She would like the frequency increased. No other concerns.  Of note- over the weekend with the drainage at her surgical incision site and redness, she was started on levaquin and flagyl. She remains afebrile. Continues to have some drainage.    Review of Systems:  Constitutional: Negative for fever, chills, diaphoresis.  HENT: Negative for congestion Eyes: Negative for eye pain, blurred vision, double vision and discharge.  Respiratory: Negative for cough, sputum production, shortness of breath and wheezing.   Cardiovascular: Negative for chest pain, palpitations, orthopnea and leg swelling.  Gastrointestinal: Negative for heartburn, nausea,  vomiting, abdominal pain Genitourinary: Negative for dysuria Musculoskeletal: Negative for back pain, falls Skin: Negative for itching, rash.  Neurological: Negative for dizziness, tingling, focal weakness and headaches. Positive for generalized weakness  Psychiatric/Behavioral: Negative for depression. Has anxiety  Past Medical History  Diagnosis Date  . ANEMIA 06/30/2007  . COLONIC POLYPS, HX OF 01/21/2007  . COLONIC POLYPS, HX OF 01/21/2007  . DEGENERATIVE JOINT DISEASE 05/19/2007  . DEPRESSION 02/07/2010  . DIASTOLIC HEART FAILURE, CHRONIC 02/27/2009  . DIVERTICULOSIS, COLON 01/21/2007  . DYSPNEA 11/13/2009  . Edema 11/13/2009  . GERD 01/21/2007  . HIATAL HERNIA WITH REFLUX 01/21/2007  . HYPERTENSION 01/21/2007  . LIMB PAIN 11/13/2009  . NECK PAIN, CHRONIC 02/13/2010  . Pure hypercholesterolemia 04/05/2008  . PONV (postoperative nausea and vomiting)     Pt had problems with memory after procedure  . Colitis     Hx: of  . Pneumonia   . Bronchitis     Hx: of  . FHx: allergies     Hx;of  . Heart palpitations     Hx: of  . Headache(784.0)   . CORONARY ARTERY DISEASE 04/05/2008    BMS to RCA 10/2007  . TIA (transient ischemic attack)    Past Surgical History  Procedure Laterality Date  . Appendectomy    . Cholecystectomy    . Abdominal hysterectomy    . Lumbar laminectomy    . Total knee arthroplasty      3 left knee replacements, 2 right knee replacements  . Rotator cuff repair    . Neck surgery    . Cataract extraction w/ intraocular lens  implant, bilateral Bilateral   . Tonsillectomy    .  Dilation and curettage of uterus    . Patellectomy Right 09/14/2013    DR Marlou Sa  . Patellectomy Right 09/14/2013    Procedure: PATELLECTOMY;  Surgeon: Meredith Pel, MD;  Location: [place];  Service: Orthopedics;  Laterality: Right;  . Joint replacement    . Cardiac catheterization  2009*  . Coronary angioplasty with stent placement  2009    "2 days after 1st cath"  . Partial colectomy N/A  06/02/2014    Procedure: RIGHT PARTIAL COLECTOMY ;  Surgeon: Donnie Mesa, MD;  Location: [place];  Service: General;  Laterality: N/A;  . Colostomy N/A 06/02/2014    Procedure: DIVERTING DESCENDING END COLOSTOMY;  Surgeon: Donnie Mesa, MD;  Location: [place];  Service: General;  Laterality: N/A;   Social History:   reports that she quit smoking about 46 years ago. She has never used smokeless tobacco. She reports that she does not drink alcohol or use illicit drugs.  Family History  Problem Relation Age of Onset  . Osteoarthritis Mother   . Heart failure Mother     Medications: Patient's Medications  New Prescriptions   No medications on file  Previous Medications   ACETAMINOPHEN (TYLENOL) 325 MG TABLET    Take 2 tablets (650 mg total) by mouth every 6 (six) hours as needed for mild pain (or Fever >/= 101).   AMINO ACIDS-PROTEIN HYDROLYS (FEEDING SUPPLEMENT, PRO-STAT SUGAR FREE 64,) LIQD    Take 30 mLs by mouth 3 (three) times daily with meals.   ASCORBIC ACID (VITAMIN C) 500 MG CAPS    Take 1 tablet by mouth daily.    BACLOFEN (LIORESAL) 10 MG TABLET    Take 10 mg by mouth 3 (three) times daily.   BENAZEPRIL (LOTENSIN) 40 MG TABLET    Take 0.5 tablets (20 mg total) by mouth daily.   CHOLECALCIFEROL (VITAMIN D) 1000 UNITS TABLET    Take 1,000 Units by mouth daily.   FERROUS SULFATE 325 (65 FE) MG TABLET    Take 1 tablet (325 mg total) by mouth 2 (two) times daily with a meal.   FUROSEMIDE (LASIX) 20 MG TABLET    Take 20 mg by mouth daily.   GABAPENTIN (NEURONTIN) 600 MG TABLET    Take 600 mg by mouth daily.   HYDROCODONE-ACETAMINOPHEN (NORCO) 5-325 MG PER TABLET    Take 1-2 tablets by mouth every 6 (six) hours as needed for moderate pain.   LEVOFLOXACIN (LEVAQUIN) 500 MG TABLET    Take 500 mg by mouth daily.   LORAZEPAM (ATIVAN) 0.5 MG TABLET    Take 1 tablet (0.5 mg total) by mouth 2 (two) times daily as needed for anxiety or sleep.   METOPROLOL (LOPRESSOR) 25 MG TABLET    Take 0.5  tablets (12.5 mg total) by mouth 2 (two) times daily.   METRONIDAZOLE (FLAGYL) 500 MG TABLET    Take 500 mg by mouth 3 (three) times daily.   MULTIPLE VITAMINS-MINERALS (MULTIVITAMIN WITH MINERALS) TABLET    Take 1 tablet by mouth daily.   NITROGLYCERIN (NITROSTAT) 0.4 MG SL TABLET    Place 0.4 mg under the tongue every 5 (five) minutes as needed for chest pain.    POTASSIUM CHLORIDE SA (K-DUR,KLOR-CON) 20 MEQ TABLET    Take 1 tablet (20 mEq total) by mouth daily.   SACCHAROMYCES BOULARDII (FLORASTOR) 250 MG CAPSULE    Take 1 capsule (250 mg total) by mouth 2 (two) times daily.   SERTRALINE (ZOLOFT) 50 MG TABLET  Take 50 mg by mouth daily.   SIMVASTATIN (ZOCOR) 40 MG TABLET    Take 1 tablet (40 mg total) by mouth at bedtime.   TRAMADOL (ULTRAM) 50 MG TABLET    Take 1 tablet (50 mg total) by mouth every 8 (eight) hours.   TRAZODONE (DESYREL) 100 MG TABLET    Take 0.5 tablets (50 mg total) by mouth at bedtime as needed for sleep.   TRIAMCINOLONE CREAM (KENALOG) 0.1 %    Apply 1 application topically 2 (two) times daily as needed (Eczema on legs).  Modified Medications   No medications on file  Discontinued Medications   No medications on file     Physical Exam: Filed Vitals:   06/13/14 1247  BP: 121/78  Pulse: 65  Temp: 96 F (35.6 C)  Resp: 18  SpO2: 97%    General- elderly female in no acute distress Head- atraumatic, normocephalic Eyes- PERRLA, EOMI, no pallor, no icterus, no discharge Throat- moist mucus membrane, Cardiovascular- normal s1,s2, no murmurs, normal dorsalis pedis Respiratory- bilateral clear to auscultation, no wheeze, no rhonchi, no crackles, no use of accessory muscles Abdomen- bowel sounds present, soft, non tender, colostomy bag in place, no CVA tenderness Musculoskeletal- able to move all 4 extremities, no leg edema Neurological- no focal deficit Skin- warm and dry, abdominal incision with mild erythema and staples in place, no drainage, has dressing in  place on another incision site with moderate drainage Psychiatry- alert and oriented to person, place and time, normal mood and affect   Labs reviewed: Basic Metabolic Panel:  Recent Labs  09/15/13 1638  06/08/14 0548 06/09/14 0350 06/10/14 0845  NA  --   < > 139 139 140  K  --   < > 3.1* 3.2* 4.1  CL  --   < > 101 102 102  CO2  --   < > 26 25 28   GLUCOSE  --   < > 96 94 96  BUN  --   < > 12 12 16   CREATININE  --   < > 0.89 0.88 0.81  CALCIUM  --   < > 8.0* 7.8* 8.4  MG 2.0  --   --   --   --   < > = values in this interval not displayed. Liver Function Tests:  Recent Labs  12/27/13 1107 04/20/14 1131 05/31/14 1204  AST 32 29 29  ALT 10 14 12   ALKPHOS 74 59 68  BILITOT <0.2* 0.3 0.2*  PROT 7.4 7.8 7.5  ALBUMIN 3.5 3.3* 3.4*   No results for input(s): LIPASE, AMYLASE in the last 8760 hours. No results for input(s): AMMONIA in the last 8760 hours. CBC:  Recent Labs  12/27/13 1107  04/20/14 1131 05/31/14 1204  06/07/14 2000 06/08/14 0548 06/09/14 0350  WBC 7.1  < > 9.7 7.5  < > 9.0 8.4 7.5  NEUTROABS 4.0  --  6.2 6.1  --   --   --   --   HGB 8.9*  < > 7.4 Repeated and verified X2.* 9.6*  < > 10.0* 9.8* 9.6*  HCT 31.1*  < > 25.0 Repeated and verified X2.* 32.0*  < > 30.9* 31.4* 30.5*  MCV 77.0*  < > 69.4 Repeated and verified X2.* 79.0  < > 77.4* 77.7* 78.8  PLT 236  < > 212.0 237  < > 201 199 209  < > = values in this interval not displayed. Cardiac Enzymes:  Recent Labs  09/15/13 2245 09/16/13  3235 12/27/13 1107 12/27/13 1836  CKTOTAL  --   --   --  79  TROPONINI <0.30 <0.30 <0.30  --    BNP: Invalid input(s): POCBNP CBG:  Recent Labs  06/06/14 1930 06/06/14 2323 06/07/14 0400  GLUCAP 129* 117* 127*    Radiological Exams: Ct Abdomen Pelvis W Contrast  05/31/2014   ADDENDUM REPORT: 05/31/2014 16:27  ADDENDUM: Study discussed by telephone with Dr. Scot Jun On 05/31/2014 at 1625 hrs.   Electronically Signed   By: Lars Pinks M.D.   On:  05/31/2014 16:27   05/31/2014   CLINICAL DATA:  80 year old female with right side abdominal pain, severe in constant for 2 days. Diarrhea. Initial encounter.  EXAM: CT ABDOMEN AND PELVIS WITH CONTRAST  TECHNIQUE: Multidetector CT imaging of the abdomen and pelvis was performed using the standard protocol following bolus administration of intravenous contrast.  CONTRAST:  7mL OMNIPAQUE IOHEXOL 300 MG/ML  SOLN  COMPARISON:  04/28/2014 and earlier.  FINDINGS: Negative lung bases. Mild cardiomegaly. No pericardial or pleural effusion.  Stable visualized osseous structures.  Moderate volume of free fluid in the pelvis is new. Decompressed bladder. Negative rectum. Sigmoid diverticulosis re - identified, no definite adjacent mesenteric inflammation.  Decompressed left colon, transverse colon, at hepatic flexure.  Soft tissue mass like thickening at the ileocecal valve. The cecum is largely decompressed, but the small bowel is been diffusely dilated distally, with a gradual transition to more normal caliber proximal small bowel. The mass like soft tissue appears to affect both this cecum and terminal ileum, measuring up to 6 cm in length, by up to 4 cm in thickness. Pericecal lymph nodes are identified and appear stable small distal small bowel mesenteric nodes up to 8 mm short axis.  In addition to free fluid in the pelvis and a small bowel mesentery there is a small volume of fluid along the liver. No liver mass identified. Surgically absent gallbladder as before.  Small volume perisplenic fluid. Spleen, pancreas, adrenal glands, and kidneys are stable. Portal venous system is patent. Major arterial structures in the abdomen and pelvis are patent with aortoiliac calcified atherosclerosis noted. No free air. Moderate volume gastric paraesophageal or hiatal hernia contains contrast today. This has not significantly changed.  IMPRESSION: Acute small bowel obstruction due to what appears to be a soft tissue mass (apple  core lesion) at the ileocecal valve suspicious for intestinal adenocarcinoma. Up to 8 mm pericecal lymph nodes are stable. No liver or distant metastasis identified. Small volume free fluid in the abdomen and pelvis, favor reactive. No free air.  Electronically Signed: By: Lars Pinks M.D. On: 05/31/2014 16:16    Assessment/Plan  Generalized weakness From physical deconditioning. Will have her work with physical therapy and occupational therapy team to help with gait training and muscle strengthening exercises.fall precautions. Skin care. Encourage to be out of bed.   Adenocarcinoma of colon S/p hemicolectomy and descending colostomy. Pending outpatient chemotherapy post surgical recovery. Has appointment with oncology on 06/28/14. Stool draining in colostomy bag. Continue skin care. Will advance diet as tolerated. Continue tramadol 50 mg q8h for pain with prn norco and baclofen  Incision site infection Continue flagyl and levaquin for now, will have pt see surgery with ongoing drainage. Afebrile. Check cbc with diff. Continue vit c supplement  Anemia Likely post op from blood loss. Check h&h. Continue iron supplement for now  chf Continue lasix 20 mg daily, metoprolol 12.5 mg bid, lisinopril 20 mg daily and kcl supplement. Monitor  bmp  Insomnia Trazodone has been helpful, continue 50 mg qhs prn  Neuropathic pain Continue neurontin 600 mg daily  Anxiety and depression Continue sertraline 50 mg daily. On ativan 0.5 mg bid prn. Change to 0.5 mg tid prn and monitor   Family/ staff Communication: reviewed care plan with patient and nursing supervisor   Goals of care: short term rehabilitation    Labs/tests ordered- cbc with diff, cmp    Blanchie Serve, MD  [facility] [phone] (Monday-Friday 8 am - 5 pm) [phone] (afterhours)

## 2014-06-28 ENCOUNTER — Other Ambulatory Visit: Payer: Commercial Managed Care - HMO

## 2014-06-28 ENCOUNTER — Telehealth: Payer: Self-pay | Admitting: Hematology

## 2014-06-28 ENCOUNTER — Encounter: Payer: Self-pay | Admitting: Hematology

## 2014-06-28 ENCOUNTER — Ambulatory Visit (HOSPITAL_BASED_OUTPATIENT_CLINIC_OR_DEPARTMENT_OTHER): Payer: Commercial Managed Care - HMO | Admitting: Hematology

## 2014-06-28 VITALS — BP 119/61 | HR 79 | Temp 98.0°F | Resp 18 | Ht 63.0 in | Wt 167.4 lb

## 2014-06-28 DIAGNOSIS — Z800 Family history of malignant neoplasm of digestive organs: Secondary | ICD-10-CM

## 2014-06-28 DIAGNOSIS — C189 Malignant neoplasm of colon, unspecified: Secondary | ICD-10-CM

## 2014-06-28 NOTE — Telephone Encounter (Signed)
Gave avs & cal for Jan. Pt will wait to sch the chemo edu, pt is unsure if they will treat with chemo.

## 2014-06-28 NOTE — Progress Notes (Signed)
Dr.  Jonny Ruiz   -  Surgeon. Dr.  Henrene Pastor  -   GI.  Daughter  [name]      [phone]. Granddaughter     Shirlean Mylar     [phone].

## 2014-06-28 NOTE — Progress Notes (Signed)
[facility]  [phone] [phone]) [phone]  Clinic New Consult Note   Patient Care Team: Gordy Savers, MD as PCP - General (Internal Medicine) 06/28/2014  CHIEF COMPLAINTS/PURPOSE OF CONSULTATION:  Multifocal colon cancer    Adenocarcinoma, colon   06/02/2014 Initial Diagnosis Adenocarcinoma, colon. she presented with anemia, nauseam anorexia and abdominal pain and was [facility].   06/02/2014 Surgery exploratory laparotomy, right hemicolectomy, and descending colostomy by Dr. Corliss Skains   06/02/2014 Pathologic Stage multifocal (2) colon cancer at  ileocecal valve and ascending colon.,  mT4N1Mx, at least stage IIIB, grade 1, MMR normal, surgical margins negative.    06/02/2014 Tumor Marker CEA 1.7     HISTORY OF PRESENTING ILLNESS:  [name] 80 y.o. female is here because of recently diagnosed and resected multifocal colon cancer. I saw him when she was admitted to the hospital, and she is here with her family members for follow-up.  She has a history of chronic anemia and colovaginal fistula as well as diverticulosis with colonic polyps since 2008, was admitted to [facility] on 05/31/2014 With progressive abdominal pain, worse over the last couple of weeks, accompanied by nausea and vomiting, without weight loss. Workup included a CT of the abdomen and pelvis with contrast, which revealed acute small bowel obstruction due to what appears to be a soft tissue mass (apple core lesion) at the ileocecal valve suspicious for intestinal adenocarcinoma.This mass measures 6 x 4 cm. Up to 8 mm stable pericecal lymph nodes were visualized. No liver or distant metastasis identified. Small volume free fluid in the abdomen and pelvis, Likely reactive, was seen. No free air.  She underwent exploratory laparotomy, right hemicolectomy, and descending colostomy by Dr. Corliss Skains on 06/02/2014. Grossly, there are two tumors identified. The larger  tumor is located at the ileocecal valve and consists of an 8.5 cm span of moderately differentiated adenocarcinoma that involves the serosa. This tumor has associated perineural and lymphovascular invasion. The second discontiguous tumor is present in the proximal right colon and spans 4.0 cm and consists of well differentiated adenocarcinoma that is morphologically dissimilar from the larger tumor. This tumor extends into the muscularis propria.Her CEA is 1.7. It is stage mT4, N1b. immunohistochemical stains are normal, with very low probability of microsatellite instability (See details below ).  She was discharged to rehabilitation after her surgery. She has been doing well at to rehabilitation. She had a surgical wound infection a few weeks after surgery, which was treated with oral antibiotics and wound care. She was not very physically active before the surgery due to her multiple arthritis, she is recovering well at the nursing home and is likely going home in a few days. She is able to move around with a walker, has good appetite, surgical site pain is controlled, no nausea or other complaints. She did developed some UTI symptoms, and started Cipro and probiotics yesterday at the nursing home.  MEDICAL HISTORY:  Past Medical History  Diagnosis Date  . ANEMIA 06/30/2007  . COLONIC POLYPS, HX OF 01/21/2007  . COLONIC POLYPS, HX OF 01/21/2007  . DEGENERATIVE JOINT DISEASE 05/19/2007  . DEPRESSION 02/07/2010  . DIASTOLIC HEART FAILURE, CHRONIC 02/27/2009  . DIVERTICULOSIS, COLON 01/21/2007  . DYSPNEA 11/13/2009  . Edema 11/13/2009  . GERD 01/21/2007  . HIATAL HERNIA WITH REFLUX 01/21/2007  . HYPERTENSION 01/21/2007  . LIMB PAIN 11/13/2009  . NECK PAIN, CHRONIC 02/13/2010  . Pure hypercholesterolemia 04/05/2008  . PONV (postoperative nausea  and vomiting)     Pt had problems with memory after procedure  . Colitis     Hx: of  . Pneumonia   . Bronchitis     Hx: of  . FHx: allergies     Hx;of  . Heart  palpitations     Hx: of  . Headache(784.0)   . CORONARY ARTERY DISEASE 04/05/2008    BMS to RCA 10/2007  . TIA (transient ischemic attack)     SURGICAL HISTORY: Past Surgical History  Procedure Laterality Date  . Appendectomy    . Cholecystectomy    . Abdominal hysterectomy    . Lumbar laminectomy    . Total knee arthroplasty      3 left knee replacements, 2 right knee replacements  . Rotator cuff repair    . Neck surgery    . Cataract extraction w/ intraocular lens  implant, bilateral Bilateral   . Tonsillectomy    . Dilation and curettage of uterus    . Patellectomy Right 09/14/2013    DR Marlou Sa  . Patellectomy Right 09/14/2013    Procedure: PATELLECTOMY;  Surgeon: Meredith Pel, MD;  Location: [place];  Service: Orthopedics;  Laterality: Right;  . Joint replacement    . Cardiac catheterization  2009*  . Coronary angioplasty with stent placement  2009    "2 days after 1st cath"  . Partial colectomy N/A 06/02/2014    Procedure: RIGHT PARTIAL COLECTOMY ;  Surgeon: Donnie Mesa, MD;  Location: [place];  Service: General;  Laterality: N/A;  . Colostomy N/A 06/02/2014    Procedure: DIVERTING DESCENDING END COLOSTOMY;  Surgeon: Donnie Mesa, MD;  Location: [place];  Service: General;  Laterality: N/A;    SOCIAL HISTORY: History   Social History  . Marital Status: Married    Spouse Name: N/A    Number of Children: N/A  . Years of Education: N/A   Occupational History  . Not on file.   Social History Main Topics  . Smoking status: Former Smoker -- 0.50 packs/day for 15 years    Types: Cigarettes    Quit date: 07/16/1967  . Smokeless tobacco: Never Used  . Alcohol Use: No  . Drug Use: No  . Sexual Activity: Not on file   Other Topics Concern  . Not on file   Social History Narrative   Married, 2 children.  As of November 2015 her husband has been living in a nursing home for tendon after years as he suffers from multiple medical problems.   Patient does not drink  alcohol nor does she smoke or chew tobacco products   Right handed   8th grade   1 cup daily   She worked for [employer] for 4 years.    FAMILY HISTORY: Family History  Problem Relation Age of Onset  . Osteoarthritis Mother   . Heart failure Mother   . Cancer Sister 56    ? colon cancer   . Cancer Brother 30    kidney cancer   . Cancer Maternal Uncle 47    colon cancer     ALLERGIES:  is allergic to potassium-containing compounds.  MEDICATIONS:  Current Outpatient Prescriptions  Medication Sig Dispense Refill  . acetaminophen (TYLENOL) 325 MG tablet Take 2 tablets (650 mg total) by mouth every 6 (six) hours as needed for mild pain (or Fever >/= 101).    . Amino Acids-Protein Hydrolys (FEEDING SUPPLEMENT, PRO-STAT SUGAR FREE 64,) LIQD Take 30 mLs by mouth 3 (three)  times daily with meals. 900 mL 0  . Ascorbic Acid (VITAMIN C) 500 MG CAPS Take 1 tablet by mouth daily.     . baclofen (LIORESAL) 10 MG tablet Take 10 mg by mouth 3 (three) times daily.    . benazepril (LOTENSIN) 40 MG tablet Take 0.5 tablets (20 mg total) by mouth daily.    . cholecalciferol (VITAMIN D) 1000 UNITS tablet Take 1,000 Units by mouth daily.    . ferrous sulfate 325 (65 FE) MG tablet Take 1 tablet (325 mg total) by mouth 2 (two) times daily with a meal.  3  . furosemide (LASIX) 20 MG tablet Take 20 mg by mouth daily.    Marland Kitchen gabapentin (NEURONTIN) 600 MG tablet Take 600 mg by mouth daily.    Marland Kitchen HYDROcodone-acetaminophen (NORCO) 5-325 MG per tablet Take 1-2 tablets by mouth every 6 (six) hours as needed for moderate pain. 240 tablet 0  . LORazepam (ATIVAN) 0.5 MG tablet Take 1 tablet (0.5 mg total) by mouth 2 (two) times daily as needed for anxiety or sleep. 30 tablet 0  . metoprolol (LOPRESSOR) 25 MG tablet Take 0.5 tablets (12.5 mg total) by mouth 2 (two) times daily.    . metoprolol (LOPRESSOR) 50 MG tablet     . metroNIDAZOLE (FLAGYL) 500 MG tablet Take 500 mg by mouth 3 (three) times daily.    . Multiple  Vitamins-Minerals (MULTIVITAMIN WITH MINERALS) tablet Take 1 tablet by mouth daily.    . potassium chloride SA (K-DUR,KLOR-CON) 20 MEQ tablet Take 1 tablet (20 mEq total) by mouth daily.    . sertraline (ZOLOFT) 50 MG tablet Take 50 mg by mouth daily.    . simvastatin (ZOCOR) 40 MG tablet Take 1 tablet (40 mg total) by mouth at bedtime. 90 tablet 3  . spironolactone (ALDACTONE) 25 MG tablet     . traMADol (ULTRAM) 50 MG tablet Take 1 tablet (50 mg total) by mouth every 8 (eight) hours. 30 tablet 0  . traZODone (DESYREL) 100 MG tablet Take 0.5 tablets (50 mg total) by mouth at bedtime as needed for sleep. 30 tablet 0  . triamcinolone cream (KENALOG) 0.1 % Apply 1 application topically 2 (two) times daily as needed (Eczema on legs).    . nitroGLYCERIN (NITROSTAT) 0.4 MG SL tablet Place 0.4 mg under the tongue every 5 (five) minutes as needed for chest pain.      No current facility-administered medications for this visit.    REVIEW OF SYSTEMS:   Constitutional: Denies fevers, chills or abnormal night sweats Eyes: Denies blurriness of vision, double vision or watery eyes Ears, nose, mouth, throat, and face: Denies mucositis or sore throat Respiratory: Denies cough, dyspnea or wheezes Cardiovascular: Denies palpitation, chest discomfort or lower extremity swelling Gastrointestinal:  Denies nausea, heartburn or change in bowel habits Skin: Denies abnormal skin rashes Lymphatics: Denies new lymphadenopathy or easy bruising Neurological:Denies numbness, tingling or new weaknesses Behavioral/Psych: Mood is stable, no new changes  All other systems were reviewed with the patient and are negative.  PHYSICAL EXAMINATION: ECOG PERFORMANCE STATUS: 3 - Symptomatic, >50% confined to bed  Filed Vitals:   06/28/14 0951  BP: 119/61  Pulse: 79  Temp: 98 F (36.7 C)  Resp: 18   Filed Weights   06/28/14 0951  Weight: 167 lb 6.4 oz (75.932 kg)    GENERAL:alert, no distress and comfortable SKIN:  skin color, texture, turgor are normal, no rashes or significant lesions EYES: normal, conjunctiva are pink and non-injected, sclera clear  OROPHARYNX:no exudate, no erythema and lips, buccal mucosa, and tongue normal  NECK: supple, thyroid normal size, non-tender, without nodularity LYMPH:  no palpable lymphadenopathy in the cervical, axillary or inguinal LUNGS: clear to auscultation and percussion with normal breathing effort HEART: regular rate & rhythm and no murmurs and no lower extremity edema ABDOMEN:abdomen soft, (+) is midline surgical wound is covered , with some bloody discharge and the cost. (+) Colostomy back at left lower quadrant of her abdomen. non-tender and normal bowel sounds Musculoskeletal:no cyanosis of digits and no clubbing  PSYCH: alert & oriented x 3 with fluent speech NEURO: no focal motor/sensory deficits  LABORATORY DATA:  I have reviewed the data as listed Lab Results  Component Value Date   WBC 7.5 06/09/2014   HGB 9.6* 06/09/2014   HCT 30.5* 06/09/2014   MCV 78.8 06/09/2014   PLT 209 06/09/2014    Recent Labs  12/27/13 1107  04/20/14 1131 05/31/14 1204  06/08/14 0548 06/09/14 0350 06/10/14 0845  NA 137  < > 139 139  < > 139 139 140  K 5.3  < > 5.8* 4.5  < > 3.1* 3.2* 4.1  CL 99  < > 107 101  < > 101 102 102  CO2 24  < > 22 21  < > $R'26 25 28  'vp$ GLUCOSE 84  < > 90 173*  < > 96 94 96  BUN 24*  < > 39* 23  < > $R'12 12 16  'Yr$ CREATININE 1.06  < > 1.5* 1.02  < > 0.89 0.88 0.81  CALCIUM 9.3  < > 9.2 9.2  < > 8.0* 7.8* 8.4  GFRNONAA 48*  < >  --  50*  < > 59* 60* 66*  GFRAA 56*  < >  --  58*  < > 68* 69* 76*  PROT 7.4  --  7.8 7.5  --   --   --   --   ALBUMIN 3.5  --  3.3* 3.4*  --   --   --   --   AST 32  --  29 29  --   --   --   --   ALT 10  --  14 12  --   --   --   --   ALKPHOS 74  --  59 68  --   --   --   --   BILITOT <0.2*  --  0.3 0.2*  --   --   --   --   < > = values in this interval not displayed.  Pathology report: Colon, segmental  resection for tumor, right - MULTIFOCAL INVASIVE ADENOCARCINOMA, SPANNING 8.5 CM AND 4.0 CM. - ADENOCARCINOMA INVOLVES THE SEROSA. - LYMPHOVASCULAR INVASION IS IDENTIFIED. 1 of 4 FINAL for Bucy, Lyan C ([id number]) Diagnosis(continued) - PERINEURAL INVASION IS IDENTIFIED. - METASTATIC ADENOCARCINOMA IN 2 OF 16 LYMPH NODES (2/16) WITH EXTRACAPSULAR EXTENSION. - THE SURGICAL RESECTION MARGINS ARE NEGATIVE FOR ADENOCARCINOMA. - SEE ONCOLOGY TABLE BELOW. Microscopic Comment COLON AND RECTUM (INCLUDING TRANS-ANAL RESECTION): Specimen: Terminal ileum and right colon Procedure: Resection Tumor site: Ileocecal valve (tumor #1) and proximal mid ascending colon (tumor #2) Specimen integrity: Intact Macroscopic intactness of mesorectum: N/A     Macroscopic tumor perforation: Not identified Invasive tumor: Tumor #1 (ileocecal valve) Maximum size: 8.5 cm (gross measurement) Histologic type(s): Adenocarcinoma Histologic grade and differentiation: G2: moderately differentiated/low grade Type of polyp in which invasive carcinoma arose: Likely tubular adenoma Microscopic extension of invasive  tumor: Adenocarcinoma involves the serosa Invasive tumor: Tumor #2 (proximal mid ascending colon) Maximum size: 4.0 cm (gross measurement) Histologic type(s): Adenocarcinoma Histologic grade and differentiation: G1: well differentiated/low grade Type of polyp in which invasive carcinoma arose: Tubulovillous adenoma Microscopic extension of invasive tumor: Adenocarcinoma extends into the muscularis propria Lymph-Vascular invasion: Present Peri-neural invasion: Present Tumor deposit(s) (discontinuous extramural extension): Not identified Resection margins: Proximal margin: 32.0 cm Distal margin: 7.5 cm Circumferential (radial) (posterior ascending, posterior descending; lateral and posterior mid-rectum; and entire lower 1/3 rectum): 3.0 cm Treatment effect (neo-adjuvant therapy): N/A Additional  polyp(s): Not identified Non-neoplastic findings: No significant findings Lymph nodes: number examined 16; number positive: 2 Pathologic Staging: mT4, N1b Ancillary studies: MSI testing will be performed on the larger higher grade tumor and the results reported separately. Additional studies can be performed upon clinician request. Comments: Grossly, there are two tumors identified. The larger tumor is located at the ileocecal valve and consists of an 8.5 cm span of moderately differentiated adenocarcinoma that involves the serosa. This tumor has associated perineural and lymphovascular invasion. The second discontiguous tumor is present in the proximal right colon and spans 4.0 cm and consists of well differentiated adenocarcinoma that is morphologically dissimilar from the larger tumor. This tumor extends into the muscularis propria. Enid Cutter MD Pathologist, Electronic Signature (Case signed 06/06/2014) Specimen Gross and Clinical Information   RADIOGRAPHIC STUDIES: I have personally reviewed the radiological images as listed and agreed with the findings in the report. Ct Abdomen Pelvis W Contrast  05/31/2014   ADDENDUM REPORT: 05/31/2014 16:27  ADDENDUM: Study discussed by telephone with Dr. Scot Jun On 05/31/2014 at 1625 hrs.   Electronically Signed   By: Lars Pinks M.D.   On: 05/31/2014 16:27   05/31/2014   CLINICAL DATA:  80 year old female with right side abdominal pain, severe in constant for 2 days. Diarrhea. Initial encounter.  EXAM: CT ABDOMEN AND PELVIS WITH CONTRAST  TECHNIQUE: Multidetector CT imaging of the abdomen and pelvis was performed using the standard protocol following bolus administration of intravenous contrast.  CONTRAST:  30mL OMNIPAQUE IOHEXOL 300 MG/ML  SOLN  COMPARISON:  04/28/2014 and earlier.  FINDINGS: Negative lung bases. Mild cardiomegaly. No pericardial or pleural effusion.  Stable visualized osseous structures.  Moderate volume of free fluid in the  pelvis is new. Decompressed bladder. Negative rectum. Sigmoid diverticulosis re - identified, no definite adjacent mesenteric inflammation.  Decompressed left colon, transverse colon, at hepatic flexure.  Soft tissue mass like thickening at the ileocecal valve. The cecum is largely decompressed, but the small bowel is been diffusely dilated distally, with a gradual transition to more normal caliber proximal small bowel. The mass like soft tissue appears to affect both this cecum and terminal ileum, measuring up to 6 cm in length, by up to 4 cm in thickness. Pericecal lymph nodes are identified and appear stable small distal small bowel mesenteric nodes up to 8 mm short axis.  In addition to free fluid in the pelvis and a small bowel mesentery there is a small volume of fluid along the liver. No liver mass identified. Surgically absent gallbladder as before.  Small volume perisplenic fluid. Spleen, pancreas, adrenal glands, and kidneys are stable. Portal venous system is patent. Major arterial structures in the abdomen and pelvis are patent with aortoiliac calcified atherosclerosis noted. No free air. Moderate volume gastric paraesophageal or hiatal hernia contains contrast today. This has not significantly changed.  IMPRESSION: Acute small bowel obstruction due to what appears to be a  soft tissue mass (apple core lesion) at the ileocecal valve suspicious for intestinal adenocarcinoma. Up to 8 mm pericecal lymph nodes are stable. No liver or distant metastasis identified. Small volume free fluid in the abdomen and pelvis, favor reactive. No free air.  Electronically Signed: By: Lars Pinks M.D. On: 05/31/2014 16:16   Dg Chest Port 1 View  06/07/2014   CLINICAL DATA:  Dyspnea  EXAM: PORTABLE CHEST - 1 VIEW  COMPARISON:  December 27, 2013  FINDINGS: There is mild atelectatic change in the right base. Elsewhere lungs are clear. Heart is upper normal in size with pulmonary vascularity within normal limits. No pneumothorax.  No adenopathy. There is postoperative change in the lower cervical spine region.  IMPRESSION: Mild atelectasis right base.  No edema or consolidation.   Electronically Signed   By: Lowella Grip M.D.   On: 06/07/2014 09:46   Dg Abd 2 Views  06/02/2014   CLINICAL DATA:  Upper abdominal pain and constipation. Small bowel obstruction.  EXAM: ABDOMEN - 2 VIEW  COMPARISON:  05/31/2014  FINDINGS: Previously administered enteric contrast is now seen extending through the colon. There is persistent moderate gas distention of several loops of small bowel with index loop within the left mid hemiabdomen measuring 3.9 cm diameter. No pneumoperitoneum, pneumatosis or portal venous gas.  Post cholecystectomy.  Moderate scoliotic curvature of the thoracolumbar spine with associated moderate to severe multilevel DDD, incompletely evaluated.  IMPRESSION: Findings suggestive of improved though persistent partial small bowel obstruction. Continued attention on follow-up is recommended.   Electronically Signed   By: Sandi Mariscal M.D.   On: 06/02/2014 09:11    ASSESSMENT & PLAN:  80 year old Caucasian female, with multiple medical comorbidities, presented with anemia, abdominal pain and nausea. She underwent exploratory laparotomy, right hemicolectomy, and descending colostomy by Dr. Georgette Dover on 06/02/2014. Surgical path reviewed multifocal (2) colon cancer at  ileocecal valve and ascending colon.  1. Multifocal invasive colon adenocarcinoma, mT4N1Mx, at least stage IIIB, grade 1, MMR normal. -I reviewed her surgical findings and her CT of abdomen and pelvis with patient and her extended family members in great detail today. Both of her colon cancer wall completely surgically resected, however she does have high risk of cancer recurrence in the future due to locally advanced stage.  -We'll get a CT of chest to complete staging. -We generally recommend adjuvant FOLFOX for stage III colon cancer. However, due to her advanced  age, multiple comorbidities, limited performance status and postop wound infection, I would not recommend adjuvant FOLFOX. If she is able to recover well in the next months, I Mckamey consider adjuvant Xeloda for 3-6 months, or surveillance alone. Her tumor has no microsatellite instability, still would to be sensitive to 5-FU or Xeloda alone. This was extensively discussed with patient and her family members. She expressed her preference of quality of life, does not want get too sick from chemotherapy. I given her the printout material of capecitabine, and encouraged her to participate a chemotherapy class eductional session in our cancer center. -I'll meet her again in 1 month's, who developed her candidacy for adjuvant capecitabine.  -We also discussed surveillance of her colon cancer, for total 5 years.  2. Genetics -Due to her family history of colon cancer, and her daughters multifocal polyps, I recommend her to have a genetic counseling. Her daughter met our Dietitian before. I'll set up the referral.  3. She will also follow-up with her surgeon for her wound healing issue.  4. Follow-up with her primary care physician for other medical issues.  Plan #1 CT of chest to bleed staging #2 genetic counseling referral #3 return to clinic in 1 month with labs.   Orders Placed This Encounter  Procedures  . CT Chest Wo Contrast    Standing Status: Future     Number of Occurrences:      Standing Expiration Date: 06/28/2015    Order Specific Question:  Reason for Exam (SYMPTOM  OR DIAGNOSIS REQUIRED)    Answer:  stage IIIB colon cancer, staging    Order Specific Question:  Preferred imaging location?    Answer:  [facility]  . CBC with Differential    Standing Status: Standing     Number of Occurrences: 10     Standing Expiration Date: 06/29/2015  . Comprehensive metabolic panel (Cmet) - [facility]    Standing Status: Standing     Number of Occurrences: 10     Standing  Expiration Date: 06/29/2015  . CE[facility]    Standing Status: Standing     Number of Occurrences: 10     Standing Expiration Date: 06/29/2015  . Ambulatory referral to Genetics    Referral Priority:  Routine    Referral Type:  Consultation    Referral Reason:  Specialty Services Required    Number of Visits Requested:  1    All questions were answered. The patient knows to call the clinic with any problems, questions or concerns. I spent 40 minutes counseling the patient face to face. The total time spent in the appointment was 60 minutes and more than 50% was on counseling.     Truitt Merle, MD 06/28/2014 10:47 PM

## 2014-06-29 ENCOUNTER — Encounter: Payer: Self-pay | Admitting: Registered Nurse

## 2014-06-29 ENCOUNTER — Non-Acute Institutional Stay (SKILLED_NURSING_FACILITY): Payer: Commercial Managed Care - HMO | Admitting: Registered Nurse

## 2014-06-29 DIAGNOSIS — F32A Depression, unspecified: Secondary | ICD-10-CM

## 2014-06-29 DIAGNOSIS — K219 Gastro-esophageal reflux disease without esophagitis: Secondary | ICD-10-CM

## 2014-06-29 DIAGNOSIS — C189 Malignant neoplasm of colon, unspecified: Secondary | ICD-10-CM

## 2014-06-29 DIAGNOSIS — F418 Other specified anxiety disorders: Secondary | ICD-10-CM

## 2014-06-29 DIAGNOSIS — S31109D Unspecified open wound of abdominal wall, unspecified quadrant without penetration into peritoneal cavity, subsequent encounter: Secondary | ICD-10-CM

## 2014-06-29 DIAGNOSIS — F329 Major depressive disorder, single episode, unspecified: Secondary | ICD-10-CM

## 2014-06-29 DIAGNOSIS — N3000 Acute cystitis without hematuria: Secondary | ICD-10-CM

## 2014-06-29 DIAGNOSIS — I5032 Chronic diastolic (congestive) heart failure: Secondary | ICD-10-CM

## 2014-06-29 DIAGNOSIS — I10 Essential (primary) hypertension: Secondary | ICD-10-CM

## 2014-06-29 DIAGNOSIS — F419 Anxiety disorder, unspecified: Secondary | ICD-10-CM

## 2014-06-29 DIAGNOSIS — E785 Hyperlipidemia, unspecified: Secondary | ICD-10-CM

## 2014-06-29 DIAGNOSIS — G4700 Insomnia, unspecified: Secondary | ICD-10-CM

## 2014-06-29 NOTE — Progress Notes (Signed)
Patient ID: [name], female   DOB: 05/02/1930, 80 y.o.   MRN: [id number]   Place of Service: [facility]  Allergies  Allergen Reactions  . Potassium-Containing Compounds Swelling    Allergic to Intravenous infusion.    Code Status: Full Code  Goals of Care: Longevity/STR  Chief Complaint  Patient presents with  . Discharge Note    HPI 80 y.o. female with PMH of CHF, chronic pain, HLD, GERD, diverticulosis, colon polyps, DJD among others being seen for a discharge visit. Patient was here for short-term rehabilitation post hospital admission from 05/31/14 to 06/10/14 for multifocal invasive adenocarcinoma of colon s/p hemicolectomy and descending colostomy . Patient has worked with therapy team and is ready to be discharged home with [facility] PT/OT/RN and DME (3-1)  Review of Systems Constitutional: Negative for fever, chills, and fatigue. HENT: Negative for ear pain, congestion, and sore throat Eyes: Negative for eye pain, eye discharge, and visual disturbance  Cardiovascular: Negative for chest pain, palpitations, and leg swelling Respiratory: Negative cough, shortness of breath, and wheezing.  Gastrointestinal: Negative for nausea and vomiting. Negative for abdominal pain, diarrhea and constipation.  Genitourinary: Positive for dysuria Endocrine: Negative for polydipsia, polyphagia, and polyuria Musculoskeletal: Negative for back pain, joint pain, and joint swelling  Neurological: Negative for dizziness and headache. Skin: Negative for rash and pruritis Psychiatric: Negative for depression  Past Medical History  Diagnosis Date  . ANEMIA 06/30/2007  . COLONIC POLYPS, HX OF 01/21/2007  . COLONIC POLYPS, HX OF 01/21/2007  . DEGENERATIVE JOINT DISEASE 05/19/2007  . DEPRESSION 02/07/2010  . DIASTOLIC HEART FAILURE, CHRONIC 02/27/2009  . DIVERTICULOSIS, COLON 01/21/2007  . DYSPNEA 11/13/2009  . Edema 11/13/2009  . GERD 01/21/2007  . HIATAL HERNIA WITH REFLUX 01/21/2007  .  HYPERTENSION 01/21/2007  . LIMB PAIN 11/13/2009  . NECK PAIN, CHRONIC 02/13/2010  . Pure hypercholesterolemia 04/05/2008  . PONV (postoperative nausea and vomiting)     Pt had problems with memory after procedure  . Colitis     Hx: of  . Pneumonia   . Bronchitis     Hx: of  . FHx: allergies     Hx;of  . Heart palpitations     Hx: of  . Headache(784.0)   . CORONARY ARTERY DISEASE 04/05/2008    BMS to RCA 10/2007  . TIA (transient ischemic attack)     Past Surgical History  Procedure Laterality Date  . Appendectomy    . Cholecystectomy    . Abdominal hysterectomy    . Lumbar laminectomy    . Total knee arthroplasty      3 left knee replacements, 2 right knee replacements  . Rotator cuff repair    . Neck surgery    . Cataract extraction w/ intraocular lens  implant, bilateral Bilateral   . Tonsillectomy    . Dilation and curettage of uterus    . Patellectomy Right 09/14/2013    DR Marlou Sa  . Patellectomy Right 09/14/2013    Procedure: PATELLECTOMY;  Surgeon: Meredith Pel, MD;  Location: [place];  Service: Orthopedics;  Laterality: Right;  . Joint replacement    . Cardiac catheterization  2009*  . Coronary angioplasty with stent placement  2009    "2 days after 1st cath"  . Partial colectomy N/A 06/02/2014    Procedure: RIGHT PARTIAL COLECTOMY ;  Surgeon: Donnie Mesa, MD;  Location: [place];  Service: General;  Laterality: N/A;  . Colostomy N/A 06/02/2014    Procedure: DIVERTING  DESCENDING END COLOSTOMY;  Surgeon: Donnie Mesa, MD;  Location: [place];  Service: General;  Laterality: N/A;    History   Social History  . Marital Status: Married    Spouse Name: N/A    Number of Children: N/A  . Years of Education: N/A   Occupational History  . Not on file.   Social History Main Topics  . Smoking status: Former Smoker -- 0.50 packs/day for 15 years    Types: Cigarettes    Quit date: 07/16/1967  . Smokeless tobacco: Never Used  . Alcohol Use: No  . Drug Use: No  . Sexual  Activity: Not on file   Other Topics Concern  . Not on file   Social History Narrative   Married, 2 children.  As of November 2015 her husband has been living in a nursing home for tendon after years as he suffers from multiple medical problems.   Patient does not drink alcohol nor does she smoke or chew tobacco products   Right handed   8th grade   1 cup daily    Family History  Problem Relation Age of Onset  . Osteoarthritis Mother   . Heart failure Mother   . Cancer Sister 44    ? colon cancer   . Cancer Brother 30    kidney cancer   . Cancer Maternal Uncle 42    colon cancer       Medication List       This list is accurate as of: 06/29/14  9:00 PM.  Always use your most recent med list.               acetaminophen 325 MG tablet  Commonly known as:  TYLENOL  Take 2 tablets (650 mg total) by mouth every 6 (six) hours as needed for mild pain (or Fever >/= 101).     baclofen 10 MG tablet  Commonly known as:  LIORESAL  Take 10 mg by mouth 3 (three) times daily.     benazepril 40 MG tablet  Commonly known as:  LOTENSIN  Take 0.5 tablets (20 mg total) by mouth daily.     cholecalciferol 1000 UNITS tablet  Commonly known as:  VITAMIN D  Take 1,000 Units by mouth daily.     ciprofloxacin 500 MG tablet  Commonly known as:  CIPRO  Take 500 mg by mouth 2 (two) times daily.     feeding supplement (PRO-STAT SUGAR FREE 64) Liqd  Take 30 mLs by mouth 3 (three) times daily with meals.     ferrous sulfate 325 (65 FE) MG tablet  Take 1 tablet (325 mg total) by mouth 2 (two) times daily with a meal.     furosemide 20 MG tablet  Commonly known as:  LASIX  Take 20 mg by mouth daily.     gabapentin 600 MG tablet  Commonly known as:  NEURONTIN  Take 600 mg by mouth daily.     HYDROcodone-acetaminophen 5-325 MG per tablet  Commonly known as:  NORCO  Take 1-2 tablets by mouth every 6 (six) hours as needed for moderate pain.     LORazepam 0.5 MG tablet  Commonly  known as:  ATIVAN  Take 1 tablet (0.5 mg total) by mouth 2 (two) times daily as needed for anxiety or sleep.     metoprolol tartrate 25 MG tablet  Commonly known as:  LOPRESSOR  Take 0.5 tablets (12.5 mg total) by mouth 2 (two) times daily.  multivitamin with minerals tablet  Take 1 tablet by mouth daily.     nitroGLYCERIN 0.4 MG SL tablet  Commonly known as:  NITROSTAT  Place 0.4 mg under the tongue every 5 (five) minutes as needed for chest pain.     potassium chloride SA 20 MEQ tablet  Commonly known as:  K-DUR,KLOR-CON  Take 1 tablet (20 mEq total) by mouth daily.     sertraline 50 MG tablet  Commonly known as:  ZOLOFT  Take 50 mg by mouth daily.     simvastatin 40 MG tablet  Commonly known as:  ZOCOR  Take 1 tablet (40 mg total) by mouth at bedtime.     traMADol 50 MG tablet  Commonly known as:  ULTRAM  Take 1 tablet (50 mg total) by mouth every 8 (eight) hours.     traZODone 100 MG tablet  Commonly known as:  DESYREL  Take 0.5 tablets (50 mg total) by mouth at bedtime as needed for sleep.     triamcinolone cream 0.1 %  Commonly known as:  KENALOG  Apply 1 application topically 2 (two) times daily as needed (Eczema on legs).     Vitamin C 500 MG Caps  Take 1 tablet by mouth daily.        Physical Exam  BP 103/57 mmHg  Pulse 78  Temp(Src) 97.6 F (36.4 C)  Resp 18  Ht 5\' 3"  (1.6 m)  Wt 173 lb (78.472 kg)  BMI 30.65 kg/m2  Constitutional: WDWN adult/elderly female in no acute distress. Conversant and pleasant HEENT: Normocephalic and atraumatic. PERRL. EOM intact. No icterus.  No nasal discharge or sinus tenderness. Oral mucosa moist. Posterior pharynx clear of any exudate or lesions.  Neck: Supple and nontender. No lymphadenopathy, masses, or thyromegaly. No JVD or carotid bruits. Cardiac: Normal S1, S2. RRR without appreciable murmurs, rubs, or gallops. Distal pulses intact. No dependent edema.  Lungs: No respiratory distress. Breath sounds clear  bilaterally without rales, rhonchi, or wheezes. Abdomen: Audible bowel sounds in all quadrants. Soft, nontender, nondistended. Midline surgical incision w/o signs of infection.  LLQ colostomy in place.   Musculoskeletal: able to move all extremities. No joint erythema or tenderness. Skin: Warm and dry. No rash noted.  Neurological: Alert and oriented to person, place, and time. No focal deficits.  Psychiatric: Judgment and insight adequate. Appropriate mood and affect.   Labs Reviewed  CBC Latest Ref Rng 06/13/2014 06/09/2014 06/08/2014  WBC - 8.2 7.5 8.4  Hemoglobin 12.0 - 16.0 g/dL 11.0(A) 9.6(L) 9.8(L)  Hematocrit 36 - 46 % 36 30.5(L) 31.4(L)  Platelets 150 - 399 K/L 221 209 199    CMP Latest Ref Rng 06/13/2014 06/10/2014 06/09/2014  Glucose 70 - 99 mg/dL - 96 94  BUN 4 - 21 mg/dL 25(A) 16 12  Creatinine 0.5 - 1.1 mg/dL 1.3(A) 0.81 0.88  Sodium 137 - 147 mmol/L 139 140 139  Potassium 3.4 - 5.3 mmol/L 4.3 4.1 3.2(L)  Chloride 96 - 112 mEq/L - 102 102  CO2 19 - 32 mEq/L - 28 25  Calcium 8.4 - 10.5 mg/dL - 8.4 7.8(L)  Total Protein 6.0 - 8.3 g/dL - - -  Total Bilirubin 0.3 - 1.2 mg/dL - - -  Alkaline Phos 39 - 117 U/L - - -  AST 0 - 37 U/L - - -  ALT 0 - 35 U/L - - -    Assessment & Plan 1. Essential hypertension Stable. Continue lopressor 12.5mg  twice daily, benazepril 20mg  daily, and  lasix 20mg  daily.   2. DIASTOLIC HEART FAILURE, CHRONIC Euvolemic on exam. Continue lasix 20mg  daily with potassium supplement 76mEq daily, lopressor 12.5mg  twice daily, and benazepril 20mg  daily  3. Generalized weakness Continue HH PT/OT for strength/balance/gait training to restore function.   4. Adenocarcinoma, colon S/p hemicolectomy and descending colostomy. Pending outpatient chemo post surgical recovery. F/u with oncology. Meticulous skin care. Continue norco 5/325mg  1-2 tabs every six hours as needed for pain, tramadol 50mg  every 8 hours, and baclofen 10mg  three times daily.   5.  Insomnia Continue trazodone 50mg  daily at bedtime as needed for sleep  6. Anxiety and depression Continue ativan 0.5mg  three times daily as needed for anxiety and zoloft 50mg  daily for depression  7. Acute cystitis without hematuria Continue and complete cipro 500mg  twice daily until 07/02/14. Will discharge home with remaining abx if not completed course by time of discharge  8. HLD (hyperlipidemia) Continue zocor 40mg  daily   9. Open abdominal wall wound, subsequent encounter No signs of infection noted. Will discharge home with [facility] RN for wound management. Wet to dry dressing change daily. F/u with general surgery   Home health services:PT/OT/RN DME required: 3-1 PCP follow-up:Dr. Bluford Kaufmann on 07/18/14 at 2:00pm 30-day supply of prescription medications provided (#60 Norco 5/325mg , #30 ativan 0.5mg , #45 tramadol 50mg )  Time spent: 40 minutes on care coordination   Family/Staff Communication Plan of care discussed with patient and nursing staff. Patient and nursing staff verbalized understanding and agree with plan of care. No additional questions or concerns reported.    Arthur Holms, MSN, AGNP-C [facility] [address] [place], [place] [address] [phone] [8am-5pm] After hours: [phone]

## 2014-07-04 ENCOUNTER — Telehealth: Payer: Self-pay | Admitting: Genetic Counselor

## 2014-07-04 NOTE — Telephone Encounter (Signed)
Patient LM on my VM letting me know that she wants to cancel her genetics appointment.  I cancelled this for her.

## 2014-07-13 ENCOUNTER — Ambulatory Visit (HOSPITAL_COMMUNITY)
Admission: RE | Admit: 2014-07-13 | Discharge: 2014-07-13 | Disposition: A | Discharge status: Home / Self Care | Payer: Medicare HMO | Source: Ambulatory Visit | Attending: Hematology | Admitting: Hematology

## 2014-07-13 DIAGNOSIS — C189 Malignant neoplasm of colon, unspecified: Secondary | ICD-10-CM | POA: Diagnosis not present

## 2014-07-13 DIAGNOSIS — N3000 Acute cystitis without hematuria: Secondary | ICD-10-CM | POA: Diagnosis not present

## 2014-07-13 DIAGNOSIS — I2510 Atherosclerotic heart disease of native coronary artery without angina pectoris: Secondary | ICD-10-CM | POA: Insufficient documentation

## 2014-07-13 DIAGNOSIS — Z433 Encounter for attention to colostomy: Secondary | ICD-10-CM | POA: Diagnosis not present

## 2014-07-13 DIAGNOSIS — J9811 Atelectasis: Secondary | ICD-10-CM | POA: Diagnosis not present

## 2014-07-13 DIAGNOSIS — K449 Diaphragmatic hernia without obstruction or gangrene: Secondary | ICD-10-CM | POA: Insufficient documentation

## 2014-07-13 DIAGNOSIS — Z483 Aftercare following surgery for neoplasm: Secondary | ICD-10-CM | POA: Diagnosis not present

## 2014-07-18 ENCOUNTER — Ambulatory Visit (INDEPENDENT_AMBULATORY_CARE_PROVIDER_SITE_OTHER): Payer: Commercial Managed Care - HMO | Admitting: Internal Medicine

## 2014-07-18 ENCOUNTER — Encounter: Payer: Self-pay | Admitting: Internal Medicine

## 2014-07-18 VITALS — BP 110/70 | HR 60 | Temp 97.9°F | Resp 20 | Ht 63.0 in | Wt 166.0 lb

## 2014-07-18 DIAGNOSIS — E860 Dehydration: Secondary | ICD-10-CM

## 2014-07-18 DIAGNOSIS — I10 Essential (primary) hypertension: Secondary | ICD-10-CM

## 2014-07-18 DIAGNOSIS — M150 Primary generalized (osteo)arthritis: Secondary | ICD-10-CM

## 2014-07-18 DIAGNOSIS — R531 Weakness: Secondary | ICD-10-CM

## 2014-07-18 DIAGNOSIS — M8949 Other hypertrophic osteoarthropathy, multiple sites: Secondary | ICD-10-CM

## 2014-07-18 DIAGNOSIS — C189 Malignant neoplasm of colon, unspecified: Secondary | ICD-10-CM

## 2014-07-18 DIAGNOSIS — M159 Polyosteoarthritis, unspecified: Secondary | ICD-10-CM

## 2014-07-18 MED ORDER — LORAZEPAM 0.5 MG PO TABS: 0.5000 mg | ORAL_TABLET | Freq: Two times a day (BID) | ORAL | Status: DC | PRN

## 2014-07-18 MED ORDER — TRAMADOL HCL 50 MG PO TABS: 50.0000 mg | ORAL_TABLET | Freq: Three times a day (TID) | ORAL | Status: DC

## 2014-07-18 MED ORDER — FUROSEMIDE 20 MG PO TABS: ORAL_TABLET | ORAL | Status: DC

## 2014-07-18 MED ORDER — HYDROCODONE-ACETAMINOPHEN 5-325 MG PO TABS: 1.0000 | ORAL_TABLET | Freq: Four times a day (QID) | ORAL | Status: DC | PRN

## 2014-07-18 NOTE — Progress Notes (Signed)
Subjective:    Patient ID: [name], female    DOB: 26-Oct-1929, 81 y.o.   MRN: [id number]  HPI Admit date: 05/31/2014 Discharge date: 06/10/2014  Discharge Diagnoses:   . Small bowel obstruction Adenocarcinoma, colon cancer - new diagnosis . Essential hypertension . acute on chronic DIASTOLIC HEART FAILURE . Colovaginal fistula . Anemia   Consults:  Gen Surgery: Dr Georgette Dover GI: Dr Olevia Perches Oncology, Dr. Sandie Ano Readings from Last 3 Encounters:  06/29/14 173 lb (78.472 kg)  06/28/14 167 lb 6.4 oz (75.932 kg)  06/02/14 186 lb 15.2 oz (52.35 kg)   81 year old patient seen today following a hospital admission in November.  She was discharged for rehabilitation and has been home since December 19.  She continues to do well and gained strength.  Her postop weight is down, still 20 pounds, but her appetite is good.  She receives home PT and nursing care for wound care.   She is ambulatory with a walker, which she used preoperatively due to her significant arthritis.  She is quite pleased with her progress and is scheduled for oncology follow-up later this month.  At the present time, she is considering chemotherapy versus surveillance.  Recent chest CT for staging unremarkable for metastatic disease She has a history of hypertension which has been stable.  She still has significant osteoarthritic pain.  Past Medical History  Diagnosis Date  . ANEMIA 06/30/2007  . COLONIC POLYPS, HX OF 01/21/2007  . COLONIC POLYPS, HX OF 01/21/2007  . DEGENERATIVE JOINT DISEASE 05/19/2007  . DEPRESSION 02/07/2010  . DIASTOLIC HEART FAILURE, CHRONIC 02/27/2009  . DIVERTICULOSIS, COLON 01/21/2007  . DYSPNEA 11/13/2009  . Edema 11/13/2009  . GERD 01/21/2007  . HIATAL HERNIA WITH REFLUX 01/21/2007  . HYPERTENSION 01/21/2007  . LIMB PAIN 11/13/2009  . NECK PAIN, CHRONIC 02/13/2010  . Pure hypercholesterolemia 04/05/2008  . PONV (postoperative nausea and vomiting)     Pt had problems with memory after procedure  .  Colitis     Hx: of  . Pneumonia   . Bronchitis     Hx: of  . FHx: allergies     Hx;of  . Heart palpitations     Hx: of  . Headache(784.0)   . CORONARY ARTERY DISEASE 04/05/2008    BMS to RCA 10/2007  . TIA (transient ischemic attack)     History   Social History  . Marital Status: Married    Spouse Name: N/A    Number of Children: N/A  . Years of Education: N/A   Occupational History  . Not on file.   Social History Main Topics  . Smoking status: Former Smoker -- 0.50 packs/day for 15 years    Types: Cigarettes    Quit date: 07/16/1967  . Smokeless tobacco: Never Used  . Alcohol Use: No  . Drug Use: No  . Sexual Activity: Not on file   Other Topics Concern  . Not on file   Social History Narrative   Married, 2 children.  As of November 2015 her husband has been living in a nursing home for tendon after years as he suffers from multiple medical problems.   Patient does not drink alcohol nor does she smoke or chew tobacco products   Right handed   8th grade   1 cup daily    Past Surgical History  Procedure Laterality Date  . Appendectomy    . Cholecystectomy    . Abdominal hysterectomy    . Lumbar  laminectomy    . Total knee arthroplasty      3 left knee replacements, 2 right knee replacements  . Rotator cuff repair    . Neck surgery    . Cataract extraction w/ intraocular lens  implant, bilateral Bilateral   . Tonsillectomy    . Dilation and curettage of uterus    . Patellectomy Right 09/14/2013    DR Marlou Sa  . Patellectomy Right 09/14/2013    Procedure: PATELLECTOMY;  Surgeon: Meredith Pel, MD;  Location: [place];  Service: Orthopedics;  Laterality: Right;  . Joint replacement    . Cardiac catheterization  2009*  . Coronary angioplasty with stent placement  2009    "2 days after 1st cath"  . Partial colectomy N/A 06/02/2014    Procedure: RIGHT PARTIAL COLECTOMY ;  Surgeon: Donnie Mesa, MD;  Location: [place];  Service: General;  Laterality: N/A;  .  Colostomy N/A 06/02/2014    Procedure: DIVERTING DESCENDING END COLOSTOMY;  Surgeon: Donnie Mesa, MD;  [facility];  Service: General;  Laterality: N/A;    Family History  Problem Relation Age of Onset  . Osteoarthritis Mother   . Heart failure Mother   . Cancer Sister 55    ? colon cancer   . Cancer Brother 30    kidney cancer   . Cancer Maternal Uncle 43    colon cancer     Allergies  Allergen Reactions  . Potassium-Containing Compounds Swelling    Allergic to Intravenous infusion.    Current Outpatient Prescriptions on File Prior to Visit  Medication Sig Dispense Refill  . acetaminophen (TYLENOL) 325 MG tablet Take 2 tablets (650 mg total) by mouth every 6 (six) hours as needed for mild pain (or Fever >/= 101).    . Amino Acids-Protein Hydrolys (FEEDING SUPPLEMENT, PRO-STAT SUGAR FREE 64,) LIQD Take 30 mLs by mouth 3 (three) times daily with meals. 900 mL 0  . Ascorbic Acid (VITAMIN C) 500 MG CAPS Take 1 tablet by mouth daily.     . benazepril (LOTENSIN) 40 MG tablet Take 0.5 tablets (20 mg total) by mouth daily.    . cholecalciferol (VITAMIN D) 1000 UNITS tablet Take 1,000 Units by mouth daily.    . ferrous sulfate 325 (65 FE) MG tablet Take 1 tablet (325 mg total) by mouth 2 (two) times daily with a meal.  3  . gabapentin (NEURONTIN) 600 MG tablet Take 600 mg by mouth daily.    . metoprolol (LOPRESSOR) 25 MG tablet Take 0.5 tablets (12.5 mg total) by mouth 2 (two) times daily.    . Multiple Vitamins-Minerals (MULTIVITAMIN WITH MINERALS) tablet Take 1 tablet by mouth daily.    . nitroGLYCERIN (NITROSTAT) 0.4 MG SL tablet Place 0.4 mg under the tongue every 5 (five) minutes as needed for chest pain.     . potassium chloride SA (K-DUR,KLOR-CON) 20 MEQ tablet Take 1 tablet (20 mEq total) by mouth daily.    . sertraline (ZOLOFT) 50 MG tablet Take 50 mg by mouth daily.    . simvastatin (ZOCOR) 40 MG tablet Take 1 tablet (40 mg total) by mouth at bedtime. 90 tablet 3  .  triamcinolone cream (KENALOG) 0.1 % Apply 1 application topically 2 (two) times daily as needed (Eczema on legs).     No current facility-administered medications on file prior to visit.    BP 110/70 mmHg  Pulse 60  Temp(Src) 97.9 F (36.6 C) (Oral)  Resp 20  Ht 5\' 3"  (1.6  m)  Wt 166 lb (75.297 kg)  BMI 29.41 kg/m2  SpO2 95%    Review of Systems  Constitutional: Negative.   HENT: Negative for congestion, dental problem, hearing loss, rhinorrhea, sinus pressure, sore throat and tinnitus.   Eyes: Negative for pain, discharge and visual disturbance.  Respiratory: Negative for cough and shortness of breath.   Cardiovascular: Negative for chest pain, palpitations and leg swelling.  Gastrointestinal: Negative for nausea, vomiting, abdominal pain, diarrhea, constipation, blood in stool and abdominal distention.  Genitourinary: Negative for dysuria, urgency, frequency, hematuria, flank pain, vaginal bleeding, vaginal discharge, difficulty urinating, vaginal pain and pelvic pain.  Musculoskeletal: Positive for back pain, arthralgias, gait problem and neck stiffness. Negative for joint swelling.  Skin: Negative for rash.  Neurological: Negative for dizziness, syncope, speech difficulty, weakness, numbness and headaches.  Hematological: Negative for adenopathy.  Psychiatric/Behavioral: Negative for behavioral problems, dysphoric mood and agitation. The patient is not nervous/anxious.        Objective:   Physical Exam  Constitutional: She is oriented to person, place, and time. She appears well-developed and well-nourished.  Blood pressure 110/70  HENT:  Head: Normocephalic.  Right Ear: External ear normal.  Left Ear: External ear normal.  Mouth/Throat: Oropharynx is clear and moist.  Eyes: Conjunctivae and EOM are normal. Pupils are equal, round, and reactive to light.  Neck: Normal range of motion. Neck supple. No thyromegaly present.  Cardiovascular: Normal rate, regular rhythm,  normal heart sounds and intact distal pulses.   Pulmonary/Chest: Effort normal and breath sounds normal.  Abdominal: Soft. Bowel sounds are normal. She exhibits no mass. There is no tenderness. There is no rebound and no guarding.  Colostomy  Minimal open area in mid abdominal incision with slight drainage  Musculoskeletal: Normal range of motion. She exhibits no edema.  Lymphadenopathy:    She has no cervical adenopathy.  Neurological: She is alert and oriented to person, place, and time.  Skin: Skin is warm and dry. No rash noted.  Psychiatric: She has a normal mood and affect. Her behavior is normal.          Assessment & Plan:   Hypertension.  Well controlled.  We'll decrease furosemide to Monday, Wednesday, Friday regimen only Status post resection for colon cancer.  Follow-up oncology Osteoarthritis CAD stable  Recheck 4 months Medications reviewed and adjusted

## 2014-07-18 NOTE — Patient Instructions (Signed)
Limit your sodium (Salt) intake  Decrease furosemide to Monday, Wednesday, Friday 3 times weekly only  .Return in 4 months for follow-up

## 2014-07-18 NOTE — Progress Notes (Signed)
Pre visit review using our clinic review tool, if applicable. No additional management support is needed unless otherwise documented below in the visit note. 

## 2014-07-20 ENCOUNTER — Encounter: Payer: Commercial Managed Care - HMO | Admitting: Genetic Counselor

## 2014-07-22 ENCOUNTER — Ambulatory Visit: Payer: Commercial Managed Care - HMO | Admitting: Internal Medicine

## 2014-07-26 ENCOUNTER — Other Ambulatory Visit (HOSPITAL_BASED_OUTPATIENT_CLINIC_OR_DEPARTMENT_OTHER): Payer: Commercial Managed Care - HMO

## 2014-07-26 ENCOUNTER — Encounter: Payer: Self-pay | Admitting: Hematology

## 2014-07-26 ENCOUNTER — Ambulatory Visit (HOSPITAL_BASED_OUTPATIENT_CLINIC_OR_DEPARTMENT_OTHER): Payer: Commercial Managed Care - HMO | Admitting: Hematology

## 2014-07-26 ENCOUNTER — Telehealth: Payer: Self-pay | Admitting: Hematology

## 2014-07-26 VITALS — BP 126/81 | HR 69 | Temp 97.6°F | Resp 17 | Ht 63.0 in | Wt 166.5 lb

## 2014-07-26 DIAGNOSIS — C189 Malignant neoplasm of colon, unspecified: Secondary | ICD-10-CM

## 2014-07-26 LAB — CBC WITH DIFFERENTIAL/PLATELET
BASO%: 0.7 % (ref 0.0–2.0)
Basophils Absolute: 0.1 10*3/uL (ref 0.0–0.1)
EOS%: 4 % (ref 0.0–7.0)
Eosinophils Absolute: 0.3 10*3/uL (ref 0.0–0.5)
HCT: 35 % (ref 34.8–46.6)
HGB: 11.3 g/dL — ABNORMAL LOW (ref 11.6–15.9)
LYMPH%: 27.3 % (ref 14.0–49.7)
MCH: 26.7 pg (ref 25.1–34.0)
MCHC: 32.2 g/dL (ref 31.5–36.0)
MCV: 83 fL (ref 79.5–101.0)
MONO#: 0.8 10*3/uL (ref 0.1–0.9)
MONO%: 9.5 % (ref 0.0–14.0)
NEUT#: 4.9 10*3/uL (ref 1.5–6.5)
NEUT%: 58.5 % (ref 38.4–76.8)
Platelets: 177 10*3/uL (ref 145–400)
RBC: 4.22 10*6/uL (ref 3.70–5.45)
RDW: 18.5 % — ABNORMAL HIGH (ref 11.2–14.5)
WBC: 8.4 10*3/uL (ref 3.9–10.3)
lymph#: 2.3 10*3/uL (ref 0.9–3.3)

## 2014-07-26 LAB — COMPREHENSIVE METABOLIC PANEL (CC13)
ALT: 16 U/L (ref 0–55)
AST: 22 U/L (ref 5–34)
Albumin: 3.8 g/dL (ref 3.5–5.0)
Alkaline Phosphatase: 69 U/L (ref 40–150)
Anion Gap: 8 mEq/L (ref 3–11)
BUN: 30.5 mg/dL — ABNORMAL HIGH (ref 7.0–26.0)
CO2: 25 mEq/L (ref 22–29)
Calcium: 9.3 mg/dL (ref 8.4–10.4)
Chloride: 107 mEq/L (ref 98–109)
Creatinine: 1.3 mg/dL — ABNORMAL HIGH (ref 0.6–1.1)
EGFR: 37 mL/min/{1.73_m2} — ABNORMAL LOW (ref >90)
Glucose: 65 mg/dl — ABNORMAL LOW (ref 70–140)
Potassium: 5.1 mEq/L (ref 3.5–5.1)
Sodium: 140 mEq/L (ref 136–145)
Total Bilirubin: 0.26 mg/dL (ref 0.20–1.20)
Total Protein: 7.7 g/dL (ref 6.4–8.3)

## 2014-07-26 LAB — CEA: CEA: 1.6 ng/mL (ref 0.0–5.0)

## 2014-07-26 NOTE — Progress Notes (Signed)
[place]  [phone] Fax:([phone]) [place] Note   Patient Care Team: Marletta Lor, MD as PCP - General (Internal Medicine) 07/26/2014  CHIEF COMPLAINTS/PURPOSE OF CONSULTATION:  Multifocal colon cancer    Adenocarcinoma, colon   06/02/2014 Initial Diagnosis Adenocarcinoma, colon. she presented with anemia, nauseam anorexia and abdominal pain and was admitted [facility]2014.   06/02/2014 Surgery exploratory laparotomy, right hemicolectomy, and descending colostomy by Dr. Georgette Dover   06/02/2014 Pathologic Stage multifocal (2) colon cancer at  ileocecal valve and ascending colon.,  mT4N1Mx, at least stage IIIB, grade 1, MMR normal, surgical margins negative.    06/02/2014 Tumor Marker CEA 1.7     HISTORY OF PRESENTING ILLNESS:  [name] 81 y.o. female is here because of recently diagnosed and resected multifocal colon cancer. I saw him when she was admitted to the hospital, and she is here with her family members for follow-up.  She has a history of chronic anemia and colovaginal fistula as well as diverticulosis with colonic polyps since 2008, was admitted to [facility] on 05/31/2014 With progressive abdominal pain, worse over the last couple of weeks, accompanied by nausea and vomiting, without weight loss. Workup included a CT of the abdomen and pelvis with contrast, which revealed acute small bowel obstruction due to what appears to be a soft tissue mass (apple core lesion) at the ileocecal valve suspicious for intestinal adenocarcinoma.This mass measures 6 x 4 cm. Up to 8 mm stable pericecal lymph nodes were visualized. No liver or distant metastasis identified. Small volume free fluid in the abdomen and pelvis, Likely reactive, was seen. No free air.  She underwent exploratory laparotomy, right hemicolectomy, and descending colostomy by Dr. Georgette Dover on 06/02/2014. Grossly, there are two tumors identified. The larger  tumor is located at the ileocecal valve and consists of an 8.5 cm span of moderately differentiated adenocarcinoma that involves the serosa. This tumor has associated perineural and lymphovascular invasion. The second discontiguous tumor is present in the proximal right colon and spans 4.0 cm and consists of well differentiated adenocarcinoma that is morphologically dissimilar from the larger tumor. This tumor extends into the muscularis propria.Her CEA is 1.7. It is stage mT4, N1b. immunohistochemical stains are normal, with very low probability of microsatellite instability (See details below ).  She was discharged to rehabilitation after her surgery. She has been doing well at to rehabilitation. She had a surgical wound infection a few weeks after surgery, which was treated with oral antibiotics and wound care. She was not very physically active before the surgery due to her multiple arthritis, she is recovering well at the nursing home and is likely going home in a few days. She is able to move around with a walker, has good appetite, surgical site pain is controlled, no nausea or other complaints. She did developed some UTI symptoms, and started Cipro and probiotics yesterday at the nursing home.  INTERIM HISTORY She returns for follow up. She was discharged home form nursing home in 07/02/14. She still has some wound drainage which has not healed since the surgery, and she has a visiting nurse, she is put on a second course antibiotics for 5 days. No fever or chills, she is eating better, she has mild pain at surgical site, and chronic knee pain. She uses a walker, ambulatory well at home. No other new complains.    MEDICAL HISTORY:  Past Medical History  Diagnosis Date  . ANEMIA 06/30/2007  .  COLONIC POLYPS, HX OF 01/21/2007  . COLONIC POLYPS, HX OF 01/21/2007  . DEGENERATIVE JOINT DISEASE 05/19/2007  . DEPRESSION 02/07/2010  . DIASTOLIC HEART FAILURE, CHRONIC 02/27/2009  . DIVERTICULOSIS, COLON  01/21/2007  . DYSPNEA 11/13/2009  . Edema 11/13/2009  . GERD 01/21/2007  . HIATAL HERNIA WITH REFLUX 01/21/2007  . HYPERTENSION 01/21/2007  . LIMB PAIN 11/13/2009  . NECK PAIN, CHRONIC 02/13/2010  . Pure hypercholesterolemia 04/05/2008  . PONV (postoperative nausea and vomiting)     Pt had problems with memory after procedure  . Colitis     Hx: of  . Pneumonia   . Bronchitis     Hx: of  . FHx: allergies     Hx;of  . Heart palpitations     Hx: of  . Headache(784.0)   . CORONARY ARTERY DISEASE 04/05/2008    BMS to RCA 10/2007  . TIA (transient ischemic attack)     SURGICAL HISTORY: Past Surgical History  Procedure Laterality Date  . Appendectomy    . Cholecystectomy    . Abdominal hysterectomy    . Lumbar laminectomy    . Total knee arthroplasty      3 left knee replacements, 2 right knee replacements  . Rotator cuff repair    . Neck surgery    . Cataract extraction w/ intraocular lens  implant, bilateral Bilateral   . Tonsillectomy    . Dilation and curettage of uterus    . Patellectomy Right 09/14/2013    DR Marlou Sa  . Patellectomy Right 09/14/2013    Procedure: PATELLECTOMY;  Surgeon: Meredith Pel, MD;  Location: [facility];  Service: Orthopedics;  Laterality: Right;  . Joint replacement    . Cardiac catheterization  2009*  . Coronary angioplasty with stent placement  2009    "2 days after 1st cath"  . Partial colectomy N/A 06/02/2014    Procedure: RIGHT PARTIAL COLECTOMY ;  Surgeon: Donnie Mesa, MD;  Location: [facility];  Service: General;  Laterality: N/A;  . Colostomy N/A 06/02/2014    Procedure: DIVERTING DESCENDING END COLOSTOMY;  Surgeon: Donnie Mesa, MD;  Location: [facility];  Service: General;  Laterality: N/A;    SOCIAL HISTORY: History   Social History  . Marital Status: Married    Spouse Name: N/A    Number of Children: N/A  . Years of Education: N/A   Occupational History  . Not on file.   Social History Main Topics  . Smoking status: Former Smoker -- 0.50  packs/day for 15 years    Types: Cigarettes    Quit date: 07/16/1967  . Smokeless tobacco: Never Used  . Alcohol Use: No  . Drug Use: No  . Sexual Activity: Not on file   Other Topics Concern  . Not on file   Social History Narrative   Married, 2 children.  As of November 2015 her husband has been living in a nursing home for tendon after years as he suffers from multiple medical problems.   Patient does not drink alcohol nor does she smoke or chew tobacco products   Right handed   8th grade   1 cup daily   She worked for [employer] for 4 years.    FAMILY HISTORY: Family History  Problem Relation Age of Onset  . Osteoarthritis Mother   . Heart failure Mother   . Cancer Sister 69    ? colon cancer   . Cancer Brother 30    kidney cancer   . Cancer Maternal  Uncle 70    colon cancer     ALLERGIES:  is allergic to potassium-containing compounds.  MEDICATIONS:  Current Outpatient Prescriptions  Medication Sig Dispense Refill  . acetaminophen (TYLENOL) 325 MG tablet Take 2 tablets (650 mg total) by mouth every 6 (six) hours as needed for mild pain (or Fever >/= 101).    . Amino Acids-Protein Hydrolys (FEEDING SUPPLEMENT, PRO-STAT SUGAR FREE 64,) LIQD Take 30 mLs by mouth 3 (three) times daily with meals. 900 mL 0  . Ascorbic Acid (VITAMIN C) 500 MG CAPS Take 1 tablet by mouth daily.     . benazepril (LOTENSIN) 40 MG tablet Take 0.5 tablets (20 mg total) by mouth daily.    . cholecalciferol (VITAMIN D) 1000 UNITS tablet Take 1,000 Units by mouth daily.    . ferrous sulfate 325 (65 FE) MG tablet Take 1 tablet (325 mg total) by mouth 2 (two) times daily with a meal.  3  . furosemide (LASIX) 20 MG tablet 1 tablet Monday, Wednesday and Friday only 90 tablet 3  . gabapentin (NEURONTIN) 600 MG tablet Take 600 mg by mouth daily.    Marland Kitchen HYDROcodone-acetaminophen (NORCO) 5-325 MG per tablet Take 1-2 tablets by mouth every 6 (six) hours as needed for moderate pain. 120 tablet 0  .  LORazepam (ATIVAN) 0.5 MG tablet Take 1 tablet (0.5 mg total) by mouth 2 (two) times daily as needed for anxiety or sleep. 60 tablet 2  . metoprolol (LOPRESSOR) 25 MG tablet Take 0.5 tablets (12.5 mg total) by mouth 2 (two) times daily.    . Multiple Vitamins-Minerals (MULTIVITAMIN WITH MINERALS) tablet Take 1 tablet by mouth daily.    . nitroGLYCERIN (NITROSTAT) 0.4 MG SL tablet Place 0.4 mg under the tongue every 5 (five) minutes as needed for chest pain.     . potassium chloride SA (K-DUR,KLOR-CON) 20 MEQ tablet Take 1 tablet (20 mEq total) by mouth daily.    . sertraline (ZOLOFT) 50 MG tablet Take 50 mg by mouth daily.    . simvastatin (ZOCOR) 40 MG tablet Take 1 tablet (40 mg total) by mouth at bedtime. 90 tablet 3  . traMADol (ULTRAM) 50 MG tablet Take 1 tablet (50 mg total) by mouth every 8 (eight) hours. 60 tablet 2  . traZODone (DESYREL) 50 MG tablet     . triamcinolone cream (KENALOG) 0.1 % Apply 1 application topically 2 (two) times daily as needed (Eczema on legs).     No current facility-administered medications for this visit.    REVIEW OF SYSTEMS:   Constitutional: Denies fevers, chills or abnormal night sweats Eyes: Denies blurriness of vision, double vision or watery eyes Ears, nose, mouth, throat, and face: Denies mucositis or sore throat Respiratory: Denies cough, dyspnea or wheezes Cardiovascular: Denies palpitation, chest discomfort or lower extremity swelling Gastrointestinal:  Denies nausea, heartburn or change in bowel habits Skin: Denies abnormal skin rashes Lymphatics: Denies new lymphadenopathy or easy bruising Neurological:Denies numbness, tingling or new weaknesses Behavioral/Psych: Mood is stable, no new changes  All other systems were reviewed with the patient and are negative.  PHYSICAL EXAMINATION: ECOG PERFORMANCE STATUS: 1-2  Filed Vitals:   07/26/14 1017  BP: 126/81  Pulse: 69  Temp: 97.6 F (36.4 C)  Resp: 17   Filed Weights   07/26/14 1017    Weight: 166 lb 8 oz (75.524 kg)    GENERAL:alert, no distress and comfortable SKIN: skin color, texture, turgor are normal, no rashes or significant lesions EYES: normal, conjunctiva are  pink and non-injected, sclera clear OROPHARYNX:no exudate, no erythema and lips, buccal mucosa, and tongue normal  NECK: supple, thyroid normal size, non-tender, without nodularity LYMPH:  no palpable lymphadenopathy in the cervical, axillary or inguinal LUNGS: clear to auscultation and percussion with normal breathing effort HEART: regular rate & rhythm and no murmurs and no lower extremity edema ABDOMEN:abdomen soft, (+) is midline surgical wound is covered , with some yellow discharge. (+) Colostomy back at left lower quadrant of her abdomen. non-tender and normal bowel sounds Musculoskeletal:no cyanosis of digits and no clubbing  PSYCH: alert & oriented x 3 with fluent speech NEURO: no focal motor/sensory deficits.   LABORATORY DATA:  I have reviewed the data as listed Lab Results  Component Value Date   WBC 8.4 07/26/2014   HGB 11.3* 07/26/2014   HCT 35.0 07/26/2014   MCV 83.0 07/26/2014   PLT 177 07/26/2014    Recent Labs  04/20/14 1131 05/31/14 1204  06/08/14 0548 06/09/14 0350 06/10/14 0845 06/13/14 07/26/14 0932  NA 139 139  < > 139 139 140 139 140  K 5.8* 4.5  < > 3.1* 3.2* 4.1 4.3 5.1  CL 107 101  < > 101 102 102  --   --   CO2 22 21  < > $R'26 25 28  'nM$ --  25  GLUCOSE 90 173*  < > 96 94 96  --  65*  BUN 39* 23  < > $R'12 12 16 'qT$ 25* 30.5*  CREATININE 1.5* 1.02  < > 0.89 0.88 0.81 1.3* 1.3*  CALCIUM 9.2 9.2  < > 8.0* 7.8* 8.4  --  9.3  GFRNONAA  --  50*  < > 59* 60* 66*  --   --   GFRAA  --  58*  < > 68* 69* 76*  --   --   PROT 7.8 7.5  --   --   --   --   --  7.7  ALBUMIN 3.3* 3.4*  --   --   --   --   --  3.8  AST 29 29  --   --   --   --   --  22  ALT 14 12  --   --   --   --   --  16  ALKPHOS 59 68  --   --   --   --   --  69  BILITOT 0.3 0.2*  --   --   --   --   --  0.26  <  > = values in this interval not displayed.  Pathology report: Colon, segmental resection for tumor, right - MULTIFOCAL INVASIVE ADENOCARCINOMA, SPANNING 8.5 CM AND 4.0 CM. - ADENOCARCINOMA INVOLVES THE SEROSA. - LYMPHOVASCULAR INVASION IS IDENTIFIED. 1 of 4 FINAL for Streng, Eileene C ([id number]) Diagnosis(continued) - PERINEURAL INVASION IS IDENTIFIED. - METASTATIC ADENOCARCINOMA IN 2 OF 16 LYMPH NODES (2/16) WITH EXTRACAPSULAR EXTENSION. - THE SURGICAL RESECTION MARGINS ARE NEGATIVE FOR ADENOCARCINOMA. - SEE ONCOLOGY TABLE BELOW. Microscopic Comment COLON AND RECTUM (INCLUDING TRANS-ANAL RESECTION): Specimen: Terminal ileum and right colon Procedure: Resection Tumor site: Ileocecal valve (tumor #1) and proximal mid ascending colon (tumor #2) Specimen integrity: Intact Macroscopic intactness of mesorectum: N/A     Macroscopic tumor perforation: Not identified Invasive tumor: Tumor #1 (ileocecal valve) Maximum size: 8.5 cm (gross measurement) Histologic type(s): Adenocarcinoma Histologic grade and differentiation: G2: moderately differentiated/low grade Type of polyp in which invasive carcinoma arose: Likely tubular adenoma Microscopic extension of  invasive tumor: Adenocarcinoma involves the serosa Invasive tumor: Tumor #2 (proximal mid ascending colon) Maximum size: 4.0 cm (gross measurement) Histologic type(s): Adenocarcinoma Histologic grade and differentiation: G1: well differentiated/low grade Type of polyp in which invasive carcinoma arose: Tubulovillous adenoma Microscopic extension of invasive tumor: Adenocarcinoma extends into the muscularis propria Lymph-Vascular invasion: Present Peri-neural invasion: Present Tumor deposit(s) (discontinuous extramural extension): Not identified Resection margins: Proximal margin: 32.0 cm Distal margin: 7.5 cm Circumferential (radial) (posterior ascending, posterior descending; lateral and posterior mid-rectum; and entire lower  1/3 rectum): 3.0 cm Treatment effect (neo-adjuvant therapy): N/A Additional polyp(s): Not identified Non-neoplastic findings: No significant findings Lymph nodes: number examined 16; number positive: 2 Pathologic Staging: mT4, N1b Ancillary studies: MSI testing will be performed on the larger higher grade tumor and the results reported separately. Additional studies can be performed upon clinician request. Comments: Grossly, there are two tumors identified. The larger tumor is located at the ileocecal valve and consists of an 8.5 cm span of moderately differentiated adenocarcinoma that involves the serosa. This tumor has associated perineural and lymphovascular invasion. The second discontiguous tumor is present in the proximal right colon and spans 4.0 cm and consists of well differentiated adenocarcinoma that is morphologically dissimilar from the larger tumor. This tumor extends into the muscularis propria. Enid Cutter MD Pathologist, Electronic Signature (Case signed 06/06/2014) Specimen Gross and Clinical Information   RADIOGRAPHIC STUDIES: I have personally reviewed the radiological images as listed and agreed with the findings in the report. Ct Chest Wo Contrast  07/13/2014   CLINICAL DATA:  Subsequent encounter for colon cancer.  EXAM: CT CHEST WITHOUT CONTRAST  TECHNIQUE: Multidetector CT imaging of the chest was performed following the standard protocol without IV contrast.  COMPARISON:  09/15/2013.  06/20/2008.  FINDINGS: Soft tissue / Mediastinum: There is no axillary lymphadenopathy. No evidence for thyroid nodule. No mediastinal or hilar lymphadenopathy. The heart size is normal. Coronary artery calcification is evident. No evidence for pericardial effusion.  Lungs / Pleura: There is some minimal atelectasis in the right lung base. Two-3 millimeter nodule in the right lower lobe (image 36) was present on the 2009 exam and is not substantially changed, consistent with scarring. No  focal airspace consolidation. No pulmonary edema. No evidence for pleural effusion.  Bones: Bone windows reveal no worrisome lytic or sclerotic osseous lesions.  Upper Abdomen:  Moderate hiatal hernia is noted.  IMPRESSION: 1. Stable exam.  No evidence for metastatic disease in the chest. 2. Moderate hiatal hernia. 3. Atherosclerosis, including three-vessel coronary artery disease. Please note that although the presence of coronary artery calcium documents the presence of coronary artery disease, the severity of this disease and any potential stenosis cannot be assessed on this non-gated CT examination. Assessment for potential risk factor modification, dietary therapy or pharmacologic therapy Bracy be warranted, if clinically indicated.   Electronically Signed   By: Misty Stanley M.D.   On: 07/13/2014 10:35    ASSESSMENT & PLAN:  81 year old Caucasian female, with multiple medical comorbidities, presented with anemia, abdominal pain and nausea. She underwent exploratory laparotomy, right hemicolectomy, and descending colostomy by Dr. Georgette Dover on 06/02/2014. Surgical path reviewed multifocal (2) colon cancer at  ileocecal valve and ascending colon.  1. Multifocal invasive colon adenocarcinoma, mT4N1Mx, at least stage IIIB, grade 1, MMR normal. -I reviewed her surgical findings and her CT of abdomen and pelvis with patient and her extended family members in great detail today. Both of her colon cancer wall completely surgically resected, however she does have  high risk of cancer recurrence in the future due to locally advanced stage.  -CT chest showed no evidence of metastasis. -We again discussed the role of adjuvant chemotherapy. Given her advanced age, multiple comorbidities, limited performance status, prolonged wound healing issue, I do not strongly recommend adjuvant chemotherapy, even chemotherapy alone. She has also made up her mind not to take chemotherapy. -We discussed surveillance of her colon  cancer, for total 5 years. I'll see her with lab test including CEA every 3-4 months for the first 2 years, then every 6-12 months for additional 3 years. She will need a repeat colonoscopy and repeat CT scan in one year.  2. Genetics -Due to her family history of colon cancer, and her daughters multifocal polyps, I recommend her to have a genetic counseling. Her daughter met our Dietitian before. I'll set up the referral.  3. She will also follow-up with her surgeon for her wound healing issue.   4. Follow-up with her primary care physician for other medical issues.  Plan return to clinic in 3 month with labs.   All questions were answered. The patient knows to call the clinic with any problems, questions or concerns. I spent 20 minutes counseling the patient face to face. The total time spent in the appointment was 25 minutes and more than 50% was on counseling.     Truitt Merle, MD 07/26/2014 10:23 AM

## 2014-07-26 NOTE — Telephone Encounter (Signed)
Gave avs & cal for [employer]

## 2014-08-15 ENCOUNTER — Other Ambulatory Visit: Payer: Self-pay | Admitting: Internal Medicine

## 2014-08-22 ENCOUNTER — Other Ambulatory Visit (INDEPENDENT_AMBULATORY_CARE_PROVIDER_SITE_OTHER): Payer: Self-pay | Admitting: Surgery

## 2014-08-22 ENCOUNTER — Ambulatory Visit (HOSPITAL_COMMUNITY)
Admission: RE | Admit: 2014-08-22 | Discharge: 2014-08-22 | Disposition: A | Discharge status: Home / Self Care | Payer: Commercial Managed Care - HMO | Source: Ambulatory Visit | Attending: Surgery | Admitting: Surgery

## 2014-08-22 DIAGNOSIS — R197 Diarrhea, unspecified: Secondary | ICD-10-CM | POA: Diagnosis not present

## 2014-08-22 DIAGNOSIS — Z933 Colostomy status: Secondary | ICD-10-CM | POA: Insufficient documentation

## 2014-08-22 DIAGNOSIS — R112 Nausea with vomiting, unspecified: Secondary | ICD-10-CM | POA: Insufficient documentation

## 2014-08-22 DIAGNOSIS — C189 Malignant neoplasm of colon, unspecified: Secondary | ICD-10-CM

## 2014-08-22 LAB — COMPREHENSIVE METABOLIC PANEL
ALT: 37 U/L — ABNORMAL HIGH (ref 0–35)
AST: 39 U/L — ABNORMAL HIGH (ref 0–37)
Albumin: 3.9 g/dL (ref 3.5–5.2)
Alkaline Phosphatase: 59 U/L (ref 39–117)
Anion gap: 9 (ref 5–15)
BUN: 41 mg/dL — ABNORMAL HIGH (ref 6–23)
CO2: 26 mmol/L (ref 19–32)
Calcium: 9.3 mg/dL (ref 8.4–10.5)
Chloride: 105 mmol/L (ref 96–112)
Creatinine, Ser: 2.21 mg/dL — ABNORMAL HIGH (ref 0.50–1.10)
GFR calc Af Amer: 23 mL/min — ABNORMAL LOW (ref >90)
GFR calc non Af Amer: 20 mL/min — ABNORMAL LOW (ref >90)
Glucose, Bld: 102 mg/dL — ABNORMAL HIGH (ref 70–99)
Potassium: 4.3 mmol/L (ref 3.5–5.1)
Sodium: 140 mmol/L (ref 135–145)
Total Bilirubin: 0.5 mg/dL (ref 0.3–1.2)
Total Protein: 7.5 g/dL (ref 6.0–8.3)

## 2014-08-22 LAB — CBC WITH DIFFERENTIAL/PLATELET
Basophils Absolute: 0 10*3/uL (ref 0.0–0.1)
Basophils Relative: 0 % (ref 0–1)
Eosinophils Absolute: 0.1 10*3/uL (ref 0.0–0.7)
Eosinophils Relative: 2 % (ref 0–5)
HCT: 38.3 % (ref 36.0–46.0)
Hemoglobin: 12.2 g/dL (ref 12.0–15.0)
Lymphocytes Relative: 27 % (ref 12–46)
Lymphs Abs: 2 10*3/uL (ref 0.7–4.0)
MCH: 28 pg (ref 26.0–34.0)
MCHC: 31.9 g/dL (ref 30.0–36.0)
MCV: 87.8 fL (ref 78.0–100.0)
Monocytes Absolute: 0.5 10*3/uL (ref 0.1–1.0)
Monocytes Relative: 7 % (ref 3–12)
Neutro Abs: 4.9 10*3/uL (ref 1.7–7.7)
Neutrophils Relative %: 64 % (ref 43–77)
Platelets: 179 10*3/uL (ref 150–400)
RBC: 4.36 MIL/uL (ref 3.87–5.11)
RDW: 18.4 % — ABNORMAL HIGH (ref 11.5–15.5)
WBC: 7.6 10*3/uL (ref 4.0–10.5)

## 2014-08-25 ENCOUNTER — Ambulatory Visit: Payer: Commercial Managed Care - HMO | Admitting: Internal Medicine

## 2014-08-25 ENCOUNTER — Encounter: Payer: Self-pay | Admitting: Internal Medicine

## 2014-08-25 ENCOUNTER — Ambulatory Visit (INDEPENDENT_AMBULATORY_CARE_PROVIDER_SITE_OTHER): Payer: Commercial Managed Care - HMO | Admitting: Internal Medicine

## 2014-08-25 VITALS — BP 140/70 | HR 82 | Resp 18 | Ht 63.0 in | Wt 165.0 lb

## 2014-08-25 DIAGNOSIS — R531 Weakness: Secondary | ICD-10-CM

## 2014-08-25 DIAGNOSIS — E860 Dehydration: Secondary | ICD-10-CM

## 2014-08-25 DIAGNOSIS — R300 Dysuria: Secondary | ICD-10-CM

## 2014-08-25 DIAGNOSIS — I10 Essential (primary) hypertension: Secondary | ICD-10-CM

## 2014-08-25 DIAGNOSIS — I5032 Chronic diastolic (congestive) heart failure: Secondary | ICD-10-CM

## 2014-08-25 LAB — POCT URINALYSIS DIPSTICK
Bilirubin, UA: NEGATIVE
Blood, UA: NEGATIVE
Glucose, UA: NEGATIVE
Ketones, UA: NEGATIVE
Nitrite, UA: NEGATIVE
Protein, UA: NEGATIVE
Spec Grav, UA: <=1.005
Urobilinogen, UA: 0.2
pH, UA: 5

## 2014-08-25 LAB — BASIC METABOLIC PANEL
BUN: 32 mg/dL — ABNORMAL HIGH (ref 6–23)
CO2: 26 mEq/L (ref 19–32)
Calcium: 9.6 mg/dL (ref 8.4–10.5)
Chloride: 103 mEq/L (ref 96–112)
Creatinine, Ser: 1.14 mg/dL (ref 0.40–1.20)
GFR: 48.45 mL/min — ABNORMAL LOW (ref >60.00)
Glucose, Bld: 71 mg/dL (ref 70–99)
Potassium: 4.8 mEq/L (ref 3.5–5.1)
Sodium: 136 mEq/L (ref 135–145)

## 2014-08-25 MED ORDER — PROMETHAZINE HCL 12.5 MG PO TABS: 12.5000 mg | ORAL_TABLET | Freq: Three times a day (TID) | ORAL | Status: DC | PRN

## 2014-08-25 NOTE — Progress Notes (Signed)
Pre visit review using our clinic review tool, if applicable. No additional management support is needed unless otherwise documented below in the visit note. 

## 2014-08-25 NOTE — Progress Notes (Signed)
Subjective:    Patient ID: Langston Summerfield Grilli, female    DOB: 04-18-1930, 81 y.o.   MRN: [id number]  HPI   Admit date: 05/31/2014 Discharge date: 06/10/2014  Discharge Diagnoses:   . Small bowel obstruction Adenocarcinoma, colon cancer - new diagnosis . Essential hypertension . acute on chronic DIASTOLIC HEART FAILURE . Colovaginal fistula . Anemia  81 year old patient who has a prior history of small bowel obstruction secondary to colon cancer.  For the past 10 days she has had anorexia associated with nausea and vomiting.  She has had no recurrent vomiting for the past 4 days but appetite still is poor and she remains quite weak.  She has had copious liquid output through her ostomy.  Recent laboratory studies were checked and creatinine has increased from 1.3 to 2.21.  Medical regimen includes diuretic therapy 3 times per week as well as Lotensin.  Past Medical History  Diagnosis Date  . ANEMIA 06/30/2007  . COLONIC POLYPS, HX OF 01/21/2007  . COLONIC POLYPS, HX OF 01/21/2007  . DEGENERATIVE JOINT DISEASE 05/19/2007  . DEPRESSION 02/07/2010  . DIASTOLIC HEART FAILURE, CHRONIC 02/27/2009  . DIVERTICULOSIS, COLON 01/21/2007  . DYSPNEA 11/13/2009  . Edema 11/13/2009  . GERD 01/21/2007  . HIATAL HERNIA WITH REFLUX 01/21/2007  . HYPERTENSION 01/21/2007  . LIMB PAIN 11/13/2009  . NECK PAIN, CHRONIC 02/13/2010  . Pure hypercholesterolemia 04/05/2008  . PONV (postoperative nausea and vomiting)     Pt had problems with memory after procedure  . Colitis     Hx: of  . Pneumonia   . Bronchitis     Hx: of  . FHx: allergies     Hx;of  . Heart palpitations     Hx: of  . Headache(784.0)   . CORONARY ARTERY DISEASE 04/05/2008    BMS to RCA 10/2007  . TIA (transient ischemic attack)     History   Social History  . Marital Status: Married    Spouse Name: N/A  . Number of Children: N/A  . Years of Education: N/A   Occupational History  . Not on file.   Social History Main Topics  . Smoking  status: Former Smoker -- 0.50 packs/day for 15 years    Types: Cigarettes    Quit date: 07/16/1967  . Smokeless tobacco: Never Used  . Alcohol Use: No  . Drug Use: No  . Sexual Activity: Not on file   Other Topics Concern  . Not on file   Social History Narrative   Married, 2 children.  As of November 2015 her husband has been living in a nursing home for tendon after years as he suffers from multiple medical problems.   Patient does not drink alcohol nor does she smoke or chew tobacco products   Right handed   8th grade   1 cup daily    Past Surgical History  Procedure Laterality Date  . Appendectomy    . Cholecystectomy    . Abdominal hysterectomy    . Lumbar laminectomy    . Total knee arthroplasty      3 left knee replacements, 2 right knee replacements  . Rotator cuff repair    . Neck surgery    . Cataract extraction w/ intraocular lens  implant, bilateral Bilateral   . Tonsillectomy    . Dilation and curettage of uterus    . Patellectomy Right 09/14/2013    DR Marlou Sa  . Patellectomy Right 09/14/2013    Procedure: PATELLECTOMY;  Surgeon: Belenda Cruise  Alphonzo Severance, MD;  Location: [facility];  Service: Orthopedics;  Laterality: Right;  . Joint replacement    . Cardiac catheterization  2009*  . Coronary angioplasty with stent placement  2009    "2 days after 1st cath"  . Partial colectomy N/A 06/02/2014    Procedure: RIGHT PARTIAL COLECTOMY ;  Surgeon: Donnie Mesa, MD;  Location: [facility];  Service: General;  Laterality: N/A;  . Colostomy N/A 06/02/2014    Procedure: DIVERTING DESCENDING END COLOSTOMY;  Surgeon: Donnie Mesa, MD;  Location[facility];  Service: General;  Laterality: N/A;    Family History  Problem Relation Age of Onset  . Osteoarthritis Mother   . Heart failure Mother   . Cancer Sister 76    ? colon cancer   . Cancer Brother 30    kidney cancer   . Cancer Maternal Uncle 49    colon cancer     Allergies  Allergen Reactions  . Potassium-Containing Compounds  Swelling    Allergic to Intravenous infusion.    Current Outpatient Prescriptions on File Prior to Visit  Medication Sig Dispense Refill  . acetaminophen (TYLENOL) 325 MG tablet Take 2 tablets (650 mg total) by mouth every 6 (six) hours as needed for mild pain (or Fever >/= 101).    . Ascorbic Acid (VITAMIN C) 500 MG CAPS Take 1 tablet by mouth daily.     . cholecalciferol (VITAMIN D) 1000 UNITS tablet Take 1,000 Units by mouth daily.    . ferrous sulfate 325 (65 FE) MG tablet Take 1 tablet (325 mg total) by mouth 2 (two) times daily with a meal. (Patient taking differently: Take 325 mg by mouth daily with breakfast. )  3  . gabapentin (NEURONTIN) 600 MG tablet Take 600 mg by mouth daily.    Marland Kitchen HYDROcodone-acetaminophen (NORCO) 5-325 MG per tablet Take 1-2 tablets by mouth every 6 (six) hours as needed for moderate pain. 120 tablet 0  . LORazepam (ATIVAN) 0.5 MG tablet Take 1 tablet (0.5 mg total) by mouth 2 (two) times daily as needed for anxiety or sleep. 60 tablet 2  . metoprolol (LOPRESSOR) 25 MG tablet Take 0.5 tablets (12.5 mg total) by mouth 2 (two) times daily.    . Multiple Vitamins-Minerals (MULTIVITAMIN WITH MINERALS) tablet Take 1 tablet by mouth daily as needed.     . nitroGLYCERIN (NITROSTAT) 0.4 MG SL tablet Place 0.4 mg under the tongue every 5 (five) minutes as needed for chest pain.     . potassium chloride SA (K-DUR,KLOR-CON) 20 MEQ tablet Take 1 tablet (20 mEq total) by mouth daily.    . sertraline (ZOLOFT) 50 MG tablet Take 50 mg by mouth daily.    . simvastatin (ZOCOR) 40 MG tablet Take 1 tablet (40 mg total) by mouth at bedtime. 90 tablet 3  . traMADol (ULTRAM) 50 MG tablet Take 1 tablet (50 mg total) by mouth every 8 (eight) hours. (Patient taking differently: Take 50 mg by mouth as needed. ) 60 tablet 2  . traZODone (DESYREL) 50 MG tablet Take 50 mg by mouth at bedtime.     . triamcinolone cream (KENALOG) 0.1 % Apply 1 application topically 2 (two) times daily as needed  (Eczema on legs).     No current facility-administered medications on file prior to visit.    BP 140/70 mmHg  Pulse 82  Resp 18  Ht 5\' 3"  (1.6 m)  Wt 165 lb (74.844 kg)  BMI 29.24 kg/m2     Review  of Systems  Constitutional: Positive for activity change, appetite change and fatigue.  HENT: Negative for congestion, dental problem, hearing loss, rhinorrhea, sinus pressure, sore throat and tinnitus.   Eyes: Negative for pain, discharge and visual disturbance.  Respiratory: Negative for cough and shortness of breath.   Cardiovascular: Negative for chest pain, palpitations and leg swelling.  Gastrointestinal: Positive for nausea, vomiting, abdominal pain and diarrhea. Negative for constipation, blood in stool and abdominal distention.  Genitourinary: Negative for dysuria, urgency, frequency, hematuria, flank pain, vaginal bleeding, vaginal discharge, difficulty urinating, vaginal pain and pelvic pain.  Musculoskeletal: Negative for joint swelling, arthralgias and gait problem.  Skin: Negative for rash.  Neurological: Positive for weakness. Negative for dizziness, syncope, speech difficulty, numbness and headaches.  Hematological: Negative for adenopathy.  Psychiatric/Behavioral: Negative for behavioral problems, dysphoric mood and agitation. The patient is not nervous/anxious.        Objective:   Physical Exam  Constitutional: She is oriented to person, place, and time. She appears well-developed and well-nourished.  Appears weak but in no acute distress Blood pressure 130/70 No tachycardia  Requires assistance to transfer from a chair to the examining table  HENT:  Head: Normocephalic.  Right Ear: External ear normal.  Left Ear: External ear normal.  Mouth/Throat: Oropharynx is clear and moist.  Eyes: Conjunctivae and EOM are normal. Pupils are equal, round, and reactive to light.  Neck: Normal range of motion. Neck supple. No thyromegaly present.  Cardiovascular: Normal  rate, regular rhythm, normal heart sounds and intact distal pulses.   Pulmonary/Chest: Effort normal and breath sounds normal.  Abdominal: Soft. Bowel sounds are normal. She exhibits no mass. There is no tenderness.  Colostomy bag in place Bowel sounds active Very mild generalized tenderness No distention  Musculoskeletal: Normal range of motion.  Lymphadenopathy:    She has no cervical adenopathy.  Neurological: She is alert and oriented to person, place, and time.  Skin: Skin is warm and dry. No rash noted.  Psychiatric: She has a normal mood and affect. Her behavior is normal.          Assessment & Plan:   Recurrent nausea and vomiting in a patient with prior small bowel obstruction secondary to colon cancer Worsening volume depletion.  Will discontinue furosemide and Lotensin.  We discussed options with patient including hospital admission

## 2014-08-25 NOTE — Patient Instructions (Signed)
HOME CARE  Drink 1 cup (8 ounces) of fluid each time you have watery poop.  Do not drink the following fluids:  Those that contain simple sugars (fructose, glucose, galactose, lactose, sucrose, maltose).  Sports drinks.  Fruit juices.  Whole milk products.  Sodas.  Drinks with caffeine (coffee, tea, soda) or alcohol. Oral rehydration solution [place] be used if the doctor says it is okay. You [place] make your own solution. Follow this recipe:  - teaspoon table salt.   teaspoon baking soda.  teaspoon salt substitute containing potassium chloride.  1 tablespoons sugar.  1 liter (34 ounces) of water. Avoid the following foods:  High fiber foods, such as raw fruits and vegetables.  Nuts, seeds, and whole grain breads and cereals.    Those that are sweetened with sugar alcohols (xylitol, sorbitol, mannitol). Try eating the following foods:  Starchy foods, such as rice, toast, pasta, low-sugar cereal, oatmeal, baked potatoes, crackers, and bagels.  Bananas.  Applesauce. Eat probiotic-rich foods, such as yogurt and milk products that are fermented.  Wash your hands well after each time you have watery poop.  Only take medicine as told by your doctor.  Take a warm bath to help lessen burning or pain from having watery poop. GET HELP RIGHT AWAY IF:  You cannot drink fluids without throwing up (vomiting).  You keep throwing up.  You have blood in your poop, or your poop looks black and tarry.  You do not pee (urinate) in 6-8 hours, or there is only a small amount of very dark pee.  You have belly (abdominal) pain that gets worse or stays in the same spot (localizes).  You are weak, dizzy, confused, or light-headed.  You have a very bad headache.  Your watery poop gets worse or does not get better.  You have a fever or lasting symptoms for more than 2-3 days.  You have a fever and your symptoms suddenly get worse.

## 2014-08-26 ENCOUNTER — Telehealth: Payer: Self-pay | Admitting: Internal Medicine

## 2014-08-26 NOTE — Telephone Encounter (Signed)
Pt would results of labs done yesterday. pls call

## 2014-08-26 NOTE — Telephone Encounter (Signed)
Spoke to pt, told her lab studies are much improved and renal back to normal per Dr.K. Pt verbalized understanding. Asked pt how she was feeling. Pt stated better a little nausea yet. Told pt okay will let Dr.K know.   Dr. Raliegh Ip made aware.

## 2014-08-26 NOTE — Telephone Encounter (Signed)
Please call and check on her status.  Let her know that lab studies were much improved and renal indices were back to normal

## 2014-09-02 ENCOUNTER — Ambulatory Visit (INDEPENDENT_AMBULATORY_CARE_PROVIDER_SITE_OTHER): Payer: Commercial Managed Care - HMO | Admitting: Internal Medicine

## 2014-09-02 ENCOUNTER — Encounter: Payer: Self-pay | Admitting: Internal Medicine

## 2014-09-02 VITALS — BP 142/80 | HR 66 | Temp 97.8°F | Wt 168.0 lb

## 2014-09-02 DIAGNOSIS — R531 Weakness: Secondary | ICD-10-CM

## 2014-09-02 DIAGNOSIS — N824 Other female intestinal-genital tract fistulae: Secondary | ICD-10-CM

## 2014-09-02 DIAGNOSIS — I10 Essential (primary) hypertension: Secondary | ICD-10-CM

## 2014-09-02 NOTE — Progress Notes (Signed)
   Subjective:    Patient ID: [name], female    DOB: 1929/08/10, 81 y.o.   MRN: [id number]  HPI  Wt Readings from Last 3 Encounters:  09/02/14 168 lb (76.204 kg)  08/25/14 165 lb (74.844 kg)  07/26/14 166 lb 8 oz (75.524 kg)    Review of Systems     Objective:   Physical Exam        Assessment & Plan:

## 2014-09-02 NOTE — Progress Notes (Signed)
Subjective:    Patient ID: [name], female    DOB: December 25, 1929, 81 y.o.   MRN: [id number]  HPI  81 year old patient who is seen today in follow-up.  She has essential hypertension.  She is status post partial colectomy in November for colon CA with obstruction.  She was seen 8 days ago with weakness, history of nausea and vomiting and diarrhea.  Creatinine had increased When seen 8 days ago.  Her vomiting had been stable for the prior 4 days.  Repeat creatinine much improved.  Over the past 8 days.  The patient has continued to improve with weight gain improved appetite.  Her watery copious output through her colostomy has also improved.  She basically feels that she is back to baseline. She is scheduled to see general surgery and follow-up and is considering colostomy reversal and repair of her chronic colovaginal fistula  Past Medical History  Diagnosis Date  . ANEMIA 06/30/2007  . COLONIC POLYPS, HX OF 01/21/2007  . COLONIC POLYPS, HX OF 01/21/2007  . DEGENERATIVE JOINT DISEASE 05/19/2007  . DEPRESSION 02/07/2010  . DIASTOLIC HEART FAILURE, CHRONIC 02/27/2009  . DIVERTICULOSIS, COLON 01/21/2007  . DYSPNEA 11/13/2009  . Edema 11/13/2009  . GERD 01/21/2007  . HIATAL HERNIA WITH REFLUX 01/21/2007  . HYPERTENSION 01/21/2007  . LIMB PAIN 11/13/2009  . NECK PAIN, CHRONIC 02/13/2010  . Pure hypercholesterolemia 04/05/2008  . PONV (postoperative nausea and vomiting)     Pt had problems with memory after procedure  . Colitis     Hx: of  . Pneumonia   . Bronchitis     Hx: of  . FHx: allergies     Hx;of  . Heart palpitations     Hx: of  . Headache(784.0)   . CORONARY ARTERY DISEASE 04/05/2008    BMS to RCA 10/2007  . TIA (transient ischemic attack)     History   Social History  . Marital Status: Married    Spouse Name: N/A  . Number of Children: N/A  . Years of Education: N/A   Occupational History  . Not on file.   Social History Main Topics  . Smoking status: Former Smoker -- 0.50  packs/day for 15 years    Types: Cigarettes    Quit date: 07/16/1967  . Smokeless tobacco: Never Used  . Alcohol Use: No  . Drug Use: No  . Sexual Activity: Not on file   Other Topics Concern  . Not on file   Social History Narrative   Married, 2 children.  As of November 2015 her husband has been living in a nursing home for tendon after years as he suffers from multiple medical problems.   Patient does not drink alcohol nor does she smoke or chew tobacco products   Right handed   8th grade   1 cup daily    Past Surgical History  Procedure Laterality Date  . Appendectomy    . Cholecystectomy    . Abdominal hysterectomy    . Lumbar laminectomy    . Total knee arthroplasty      3 left knee replacements, 2 right knee replacements  . Rotator cuff repair    . Neck surgery    . Cataract extraction w/ intraocular lens  implant, bilateral Bilateral   . Tonsillectomy    . Dilation and curettage of uterus    . Patellectomy Right 09/14/2013    DR Marlou Sa  . Patellectomy Right 09/14/2013    Procedure: PATELLECTOMY;  Surgeon: Belenda Cruise  Alphonzo Severance, MD;  Location: [facility];  Service: Orthopedics;  Laterality: Right;  . Joint replacement    . Cardiac catheterization  2009*  . Coronary angioplasty with stent placement  2009    "2 days after 1st cath"  . Partial colectomy N/A 06/02/2014    Procedure: RIGHT PARTIAL COLECTOMY ;  Surgeon: Donnie Mesa, MD;  Location: [facility];  Service: General;  Laterality: N/A;  . Colostomy N/A 06/02/2014    Procedure: DIVERTING DESCENDING END COLOSTOMY;  Surgeon: Donnie Mesa, MD;  Location: [facility];  Service: General;  Laterality: N/A;    Family History  Problem Relation Age of Onset  . Osteoarthritis Mother   . Heart failure Mother   . Cancer Sister 28    ? colon cancer   . Cancer Brother 30    kidney cancer   . Cancer Maternal Uncle 36    colon cancer     Allergies  Allergen Reactions  . Potassium-Containing Compounds Swelling    Allergic to  Intravenous infusion.    Current Outpatient Prescriptions on File Prior to Visit  Medication Sig Dispense Refill  . acetaminophen (TYLENOL) 325 MG tablet Take 2 tablets (650 mg total) by mouth every 6 (six) hours as needed for mild pain (or Fever >/= 101).    . Ascorbic Acid (VITAMIN C) 500 MG CAPS Take 1 tablet by mouth daily.     . cholecalciferol (VITAMIN D) 1000 UNITS tablet Take 1,000 Units by mouth daily.    . ferrous sulfate 325 (65 FE) MG tablet Take 1 tablet (325 mg total) by mouth 2 (two) times daily with a meal. (Patient taking differently: Take 325 mg by mouth daily with breakfast. )  3  . gabapentin (NEURONTIN) 600 MG tablet Take 600 mg by mouth daily.    Marland Kitchen HYDROcodone-acetaminophen (NORCO) 5-325 MG per tablet Take 1-2 tablets by mouth every 6 (six) hours as needed for moderate pain. 120 tablet 0  . LORazepam (ATIVAN) 0.5 MG tablet Take 1 tablet (0.5 mg total) by mouth 2 (two) times daily as needed for anxiety or sleep. 60 tablet 2  . metoprolol (LOPRESSOR) 25 MG tablet Take 0.5 tablets (12.5 mg total) by mouth 2 (two) times daily.    . Multiple Vitamins-Minerals (MULTIVITAMIN WITH MINERALS) tablet Take 1 tablet by mouth daily as needed.     . nitroGLYCERIN (NITROSTAT) 0.4 MG SL tablet Place 0.4 mg under the tongue every 5 (five) minutes as needed for chest pain.     . potassium chloride SA (K-DUR,KLOR-CON) 20 MEQ tablet Take 1 tablet (20 mEq total) by mouth daily.    . promethazine (PHENERGAN) 12.5 MG tablet Take 1 tablet (12.5 mg total) by mouth every 8 (eight) hours as needed for nausea or vomiting. 20 tablet 0  . sertraline (ZOLOFT) 50 MG tablet Take 50 mg by mouth daily.    . simvastatin (ZOCOR) 40 MG tablet Take 1 tablet (40 mg total) by mouth at bedtime. 90 tablet 3  . traMADol (ULTRAM) 50 MG tablet Take 1 tablet (50 mg total) by mouth every 8 (eight) hours. (Patient taking differently: Take 50 mg by mouth as needed. ) 60 tablet 2  . traZODone (DESYREL) 50 MG tablet Take 50 mg  by mouth at bedtime.     . triamcinolone cream (KENALOG) 0.1 % Apply 1 application topically 2 (two) times daily as needed (Eczema on legs).    . promethazine (PHENERGAN) 12.5 MG tablet Take 1 tablet (12.5 mg total) by mouth every  6 (six) hours as needed for nausea. 30 tablet 0   No current facility-administered medications on file prior to visit.    BP 142/80 mmHg  Pulse 66  Temp(Src) 97.8 F (36.6 C) (Oral)  Wt 168 lb (76.204 kg)  SpO2 96%     Review of Systems  Constitutional: Positive for fatigue.  HENT: Negative for congestion, dental problem, hearing loss, rhinorrhea, sinus pressure, sore throat and tinnitus.   Eyes: Negative for pain, discharge and visual disturbance.  Respiratory: Negative for cough and shortness of breath.   Cardiovascular: Negative for chest pain, palpitations and leg swelling.  Gastrointestinal: Negative for nausea, vomiting, abdominal pain, diarrhea, constipation, blood in stool and abdominal distention.  Genitourinary: Negative for dysuria, urgency, frequency, hematuria, flank pain, vaginal bleeding, vaginal discharge, difficulty urinating, vaginal pain and pelvic pain.  Musculoskeletal: Negative for joint swelling, arthralgias and gait problem.  Skin: Negative for rash.  Neurological: Positive for weakness. Negative for dizziness, syncope, speech difficulty, numbness and headaches.  Hematological: Negative for adenopathy.  Psychiatric/Behavioral: Negative for behavioral problems, dysphoric mood and agitation. The patient is not nervous/anxious.        Objective:   Physical Exam  Constitutional: She is oriented to person, place, and time. She appears well-developed and well-nourished.  Blood pressure 132/70  HENT:  Head: Normocephalic.  Right Ear: External ear normal.  Left Ear: External ear normal.  Mouth/Throat: Oropharynx is clear and moist.  Well-hydrated  Eyes: Conjunctivae and EOM are normal. Pupils are equal, round, and reactive to  light.  Neck: Normal range of motion. Neck supple. No thyromegaly present.  Cardiovascular: Normal rate, regular rhythm, normal heart sounds and intact distal pulses.   Pulse flow and regular  Pulmonary/Chest: Effort normal and breath sounds normal.  Abdominal: Soft. Bowel sounds are normal. She exhibits no mass. There is no tenderness.  Tenderness has resolved  Musculoskeletal: Normal range of motion. She exhibits no edema.  Lymphadenopathy:    She has no cervical adenopathy.  Neurological: She is alert and oriented to person, place, and time.  Skin: Skin is warm and dry. No rash noted.  Psychiatric: She has a normal mood and affect. Her behavior is normal.          Assessment & Plan:   Gastroenteritis, resolved.  Appetite has now normalized and she has improved clinically.  Creatinine also much improved.  Weight is up 3 pounds Hypertension.  Well-controlled on metoprolol alone.  We'll continue to hold furosemide and Lotensin  General surgical follow-up Recheck here in [place] as scheduled

## 2014-09-02 NOTE — Progress Notes (Signed)
Pre visit review using our clinic review tool, if applicable. No additional management support is needed unless otherwise documented below in the visit note. 

## 2014-09-02 NOTE — Patient Instructions (Signed)
Return as scheduled for follow-up in [place]  Call or return to clinic prn if these symptoms worsen or fail to improve as anticipated.  Gen. surgical follow-up as scheduled later this month

## 2014-09-12 ENCOUNTER — Other Ambulatory Visit (INDEPENDENT_AMBULATORY_CARE_PROVIDER_SITE_OTHER): Payer: Self-pay | Admitting: Surgery

## 2014-09-12 DIAGNOSIS — N823 Fistula of vagina to large intestine: Secondary | ICD-10-CM

## 2014-09-13 ENCOUNTER — Ambulatory Visit
Admission: RE | Admit: 2014-09-13 | Discharge: 2014-09-13 | Disposition: A | Discharge status: Home / Self Care | Payer: Commercial Managed Care - HMO | Source: Ambulatory Visit | Attending: Surgery | Admitting: Surgery

## 2014-09-13 DIAGNOSIS — N823 Fistula of vagina to large intestine: Secondary | ICD-10-CM

## 2014-09-13 MED ORDER — IOPAMIDOL (ISOVUE-300) INJECTION 61%
100.0000 mL | Freq: Once | INTRAVENOUS | Status: AC | PRN
  Administered 2014-09-13: 100 mL via INTRAVENOUS

## 2014-10-04 ENCOUNTER — Other Ambulatory Visit: Payer: Self-pay | Admitting: General Surgery

## 2014-10-04 NOTE — H&P (Signed)
**Note Whitney-Identified via Obfuscation** [name] 10/04/2014 9:07 AM Location: [facility] Patient #: [id number] DOB: Devargas 14, 1931 Married / Language: English / Race: White Female History of Present Illness Whitney Ruff MD; 2/77/4128 9:56 AM) Patient words: colostomy reversal.  The patient is a 81 year old female who presents with anal itching. Patient presented to the office with a colovaginal fistula. Subsequently she was admitted to the hospital with an obstructing right-sided colon cancer. She underwent a right hemicolectomy and diverting colostomy several months ago with Dr. Georgette Dover. She is now recovered from the surgery. She has undergone a rectal contrast CT which shows no rectovaginal fistula. There is some stranding noted which appears to be where the fistula was. She does report some vaginal drainage after the CT scan, but this has cleared up now. Other Problems Whitney Ruff, MD; 7/86/7672 9:59 AM) Anxiety Disorder Arthritis Back Pain Cholelithiasis Gastric Ulcer Gastroesophageal Reflux Disease General anesthesia - complications Heart murmur High blood pressure Inguinal Hernia Transfusion history COLOVAGINAL FISTULA (619.1  N82.4) Patient with signs and system consistent with colovaginal fistula. It is unclear to me whether this has closed completely or not. She would like to have colostomy reversal. I have recommended that she undergo exam under anesthesia with rigid sigmoidoscopy to evaluate for fistula patency. COLON CANCER METASTASIZED TO MESENTERIC LYMPH NODES (153.9  C18.9) VAGINAL DISCHARGE (623.5  N89.8) Pt is a 81yo F with multiple medical problems who presents to me with concern for colovaginal fistula. CT does not show a communication. POSTOP CHECK (V67.00  Z09) POST-OPERATIVE STATE (V45.89  Z98.58) This 81 year old female who presents to the office with concerns of wound infection after surgery 6 weeks ago. She underwent a right hemicolectomy and diverting end  colostomy due to a obstructing right colon mass and a colovaginal fistula. POSTOPERATIVE WOUND INFECTION, INITIAL ENCOUNTER (998.59  T81.0NOB)  Past Surgical History Whitney Ruff, MD; 0/96/2836 9:59 AM) Appendectomy Cataract Surgery Bilateral. Colon Polyp Removal - Colonoscopy Foot Surgery Right. Gallbladder Surgery - Open Hysterectomy (not due to cancer) - Complete Knee Surgery Bilateral. Shoulder Surgery Bilateral. Spinal Surgery - Lower Back Spinal Surgery - Neck Tonsillectomy  Diagnostic Studies History Whitney Ruff, MD; 01/11/4764 9:59 AM) Colonoscopy 5-10 years ago Mammogram >3 years ago Pap Smear 1-5 years ago  Allergies Whitney Lorenzo, LPN; 4/65/0354 6:56 AM) Penicillin G Pot in Dextrose *PENICILLINS* IV Potassium No Known Drug Allergies 10/04/2014 (Marked as Inactive)  Medication History Whitney Lorenzo, LPN; 02/24/7516 0:01 AM) Tylenol Extra Strength (500MG  Tablet, Oral) Active. Vitamin C (Oral) Active. Vitamin D (400UNIT Capsule, Oral) Active. Ferrous Sulfate (324 (65 Fe)MG Tablet DR, Oral) Active. Neurontin (600MG  Tablet, Oral) Active. Norco (5-325MG  Tablet, Oral) Active. Lopressor HCT (50-25MG  Tablet, Oral) Active. Multivitamin Adults 50+ (Oral) Active. Nitrostat (0.4MG  Tab Sublingual, Sublingual) Active. Ultram (50MG  Tablet, Oral) Active. Desyrel (100MG  Tablet, Oral) Active. LORazepam (0.5MG  Tablet, Oral) Active. Promethazine HCl (12.5MG  Tablet, Oral) Active. Triamcinolone Acetonide (0.1% Cream, External) Active. Medications Reconciled  Social History Whitney Ruff, MD; 7/49/4496 9:59 AM) Caffeine use Coffee. No alcohol use No drug use Tobacco use Former smoker.  Family History Whitney Ruff, MD; 7/59/1638 9:59 AM) Arthritis Mother. Cancer Brother. Colon Cancer Sister. Colon Polyps Daughter, Son. Heart Disease Brother, Father. Heart disease in female family member before age 20 Hypertension Daughter,  Mother.    Vitals Whitney Billings Dockery LPN; 4/66/5993 5:70 AM) 10/04/2014 9:08 AM Weight: 167.13 lb Height: 63in Body Surface Area: 1.84 m Body Mass Index: 29.6 kg/m Temp.: 97.6F  Pulse: 54 (Regular)  BP: 118/76 (Sitting, Left Arm, Standard)  Physical Exam Whitney Ruff MD; 5/63/1497 9:57 AM)  General Mental Status-Alert. General Appearance-Consistent with stated age. Hydration-Well hydrated. Voice-Normal.  Head and Neck Head-normocephalic, atraumatic with no lesions or palpable masses. Trachea-midline. Thyroid Gland Characteristics - normal size and consistency.  Eye Eyeball - Bilateral-Extraocular movements intact. Sclera/Conjunctiva - Bilateral-No scleral icterus.  Chest and Lung Exam Chest and lung exam reveals -quiet, even and easy respiratory effort with no use of accessory muscles and on auscultation, normal breath sounds, no adventitious sounds and normal vocal resonance. Inspection Chest Wall - Normal. Back - normal.  Cardiovascular Cardiovascular examination reveals -normal heart sounds, regular rate and rhythm with no murmurs and normal pedal pulses bilaterally.  Abdomen Inspection Inspection of the abdomen reveals - No Hernias. Palpation/Percussion Palpation and Percussion of the abdomen reveal - Soft, Non Tender, No Rebound tenderness, No Rigidity (guarding) and No hepatosplenomegaly. Auscultation Auscultation of the abdomen reveals - Bowel sounds normal.  Rectal Anorectal Exam External - normal external exam. Internal - normal internal exam.  Neurologic Neurologic evaluation reveals -alert and oriented x 3 with no impairment of recent or remote memory. Mental Status-Normal.  Musculoskeletal Global Assessment -Note:no gross deformities.  Normal Exam - Left-Upper Extremity Strength Normal and Lower Extremity Strength Normal. Normal Exam - Right-Upper Extremity Strength Normal and Lower Extremity  Strength Normal.    Assessment & Plan Whitney Ruff MD; 0/26/3785 9:58 AM)  COLOVAGINAL FISTULA (619.1  N82.4) Story: Patient with signs and system consistent with colovaginal fistula. It is unclear to me whether this has closed completely or not. She would like to have colostomy reversal. I have recommended that she undergo exam under anesthesia with rigid sigmoidoscopy to evaluate for fistula patency.

## 2014-10-11 ENCOUNTER — Encounter (HOSPITAL_BASED_OUTPATIENT_CLINIC_OR_DEPARTMENT_OTHER): Payer: Self-pay | Admitting: *Deleted

## 2014-10-12 ENCOUNTER — Encounter (HOSPITAL_COMMUNITY): Payer: Self-pay | Admitting: *Deleted

## 2014-10-12 NOTE — Anesthesia Preprocedure Evaluation (Addendum)
Anesthesia Evaluation  Patient identified by MRN, date of birth, ID band Patient awake    Reviewed: Allergy & Precautions, NPO status , Patient's Chart, lab work & pertinent test results  History of Anesthesia Complications (+) PONV and history of anesthetic complications  Airway Mallampati: II  TM Distance: >3 FB Neck ROM: Full    Dental no notable dental hx. (+) Dental Advisory Given, Edentulous Upper, Edentulous Lower   Pulmonary COPDformer smoker,  breath sounds clear to auscultation  Pulmonary exam normal       Cardiovascular hypertension, Pt. on medications + CAD, + Cardiac Stents and +CHF Rhythm:Regular Rate:Normal     Neuro/Psych  Headaches, PSYCHIATRIC DISORDERS Anxiety Depression    GI/Hepatic negative GI ROS, Neg liver ROS, hiatal hernia, GERD-  Medicated and Controlled,  Endo/Other  negative endocrine ROS  Renal/GU Renal InsufficiencyRenal disease  negative genitourinary   Musculoskeletal  (+) Arthritis -,   Abdominal   Peds negative pediatric ROS (+)  Hematology  (+) anemia ,   Anesthesia Other Findings   Reproductive/Obstetrics negative OB ROS                            Anesthesia Physical Anesthesia Plan  ASA: III  Anesthesia Plan: MAC   Post-op Pain Management:    Induction: Intravenous  Airway Management Planned: Nasal Cannula  Additional Equipment:   Intra-op Plan:   Post-operative Plan:   Informed Consent: I have reviewed the patients History and Physical, chart, labs and discussed the procedure including the risks, benefits and alternatives for the proposed anesthesia with the patient or authorized representative who has indicated his/her understanding and acceptance.   Dental advisory given  Plan Discussed with: CRNA  Anesthesia Plan Comments:         Anesthesia Quick Evaluation

## 2014-10-13 ENCOUNTER — Encounter (HOSPITAL_COMMUNITY)
Admission: RE | Disposition: A | Discharge status: Home / Self Care | Payer: Self-pay | Source: Ambulatory Visit | Attending: General Surgery

## 2014-10-13 ENCOUNTER — Other Ambulatory Visit: Payer: Self-pay | Admitting: Internal Medicine

## 2014-10-13 ENCOUNTER — Ambulatory Visit (HOSPITAL_COMMUNITY): Payer: Commercial Managed Care - HMO | Admitting: Anesthesiology

## 2014-10-13 ENCOUNTER — Encounter (HOSPITAL_COMMUNITY): Payer: Self-pay | Admitting: *Deleted

## 2014-10-13 ENCOUNTER — Ambulatory Visit (HOSPITAL_COMMUNITY)
Admission: RE | Admit: 2014-10-13 | Discharge: 2014-10-13 | Disposition: A | Discharge status: Home / Self Care | Payer: Commercial Managed Care - HMO | Source: Ambulatory Visit | Attending: General Surgery | Admitting: General Surgery

## 2014-10-13 DIAGNOSIS — F419 Anxiety disorder, unspecified: Secondary | ICD-10-CM | POA: Diagnosis not present

## 2014-10-13 DIAGNOSIS — Z79899 Other long term (current) drug therapy: Secondary | ICD-10-CM | POA: Diagnosis not present

## 2014-10-13 DIAGNOSIS — N898 Other specified noninflammatory disorders of vagina: Secondary | ICD-10-CM | POA: Diagnosis not present

## 2014-10-13 DIAGNOSIS — I2510 Atherosclerotic heart disease of native coronary artery without angina pectoris: Secondary | ICD-10-CM | POA: Insufficient documentation

## 2014-10-13 DIAGNOSIS — F329 Major depressive disorder, single episode, unspecified: Secondary | ICD-10-CM | POA: Diagnosis not present

## 2014-10-13 DIAGNOSIS — I509 Heart failure, unspecified: Secondary | ICD-10-CM | POA: Diagnosis not present

## 2014-10-13 DIAGNOSIS — M1990 Unspecified osteoarthritis, unspecified site: Secondary | ICD-10-CM | POA: Diagnosis not present

## 2014-10-13 DIAGNOSIS — I10 Essential (primary) hypertension: Secondary | ICD-10-CM | POA: Insufficient documentation

## 2014-10-13 DIAGNOSIS — Z9089 Acquired absence of other organs: Secondary | ICD-10-CM | POA: Insufficient documentation

## 2014-10-13 DIAGNOSIS — J449 Chronic obstructive pulmonary disease, unspecified: Secondary | ICD-10-CM | POA: Insufficient documentation

## 2014-10-13 DIAGNOSIS — Z85038 Personal history of other malignant neoplasm of large intestine: Secondary | ICD-10-CM | POA: Diagnosis not present

## 2014-10-13 DIAGNOSIS — Z955 Presence of coronary angioplasty implant and graft: Secondary | ICD-10-CM | POA: Insufficient documentation

## 2014-10-13 DIAGNOSIS — Z90710 Acquired absence of both cervix and uterus: Secondary | ICD-10-CM | POA: Insufficient documentation

## 2014-10-13 DIAGNOSIS — K219 Gastro-esophageal reflux disease without esophagitis: Secondary | ICD-10-CM | POA: Insufficient documentation

## 2014-10-13 DIAGNOSIS — N823 Fistula of vagina to large intestine: Secondary | ICD-10-CM | POA: Diagnosis present

## 2014-10-13 DIAGNOSIS — D649 Anemia, unspecified: Secondary | ICD-10-CM | POA: Insufficient documentation

## 2014-10-13 DIAGNOSIS — Z7952 Long term (current) use of systemic steroids: Secondary | ICD-10-CM | POA: Diagnosis not present

## 2014-10-13 DIAGNOSIS — Z87891 Personal history of nicotine dependence: Secondary | ICD-10-CM | POA: Insufficient documentation

## 2014-10-13 DIAGNOSIS — Z79891 Long term (current) use of opiate analgesic: Secondary | ICD-10-CM | POA: Insufficient documentation

## 2014-10-13 HISTORY — DX: Diverticulosis of large intestine without perforation or abscess without bleeding: K57.30

## 2014-10-13 HISTORY — DX: Gastro-esophageal reflux disease without esophagitis: K21.9

## 2014-10-13 HISTORY — DX: Major depressive disorder, single episode, unspecified: F32.9

## 2014-10-13 HISTORY — DX: Disorder of kidney and ureter, unspecified: N28.9

## 2014-10-13 HISTORY — DX: Presence of dental prosthetic device (complete) (partial): Z97.2

## 2014-10-13 HISTORY — PX: EVALUATION UNDER ANESTHESIA WITH ANAL FISTULECTOMY: SHX5621

## 2014-10-13 HISTORY — DX: Personal history of other venous thrombosis and embolism: Z86.718

## 2014-10-13 HISTORY — DX: Personal history of adenomatous and serrated colon polyps: Z86.0101

## 2014-10-13 HISTORY — DX: Chronic obstructive pulmonary disease, unspecified: J44.9

## 2014-10-13 HISTORY — DX: Presence of spectacles and contact lenses: Z97.3

## 2014-10-13 HISTORY — DX: Personal history of other diseases of the digestive system: Z87.19

## 2014-10-13 HISTORY — DX: Other specified chronic obstructive pulmonary disease: J44.89

## 2014-10-13 HISTORY — DX: Colostomy status: Z93.3

## 2014-10-13 HISTORY — DX: Malignant neoplasm of colon, unspecified: C18.9

## 2014-10-13 HISTORY — DX: Depression, unspecified: F32.A

## 2014-10-13 HISTORY — DX: Other female intestinal-genital tract fistulae: N82.4

## 2014-10-13 HISTORY — DX: Personal history of transient ischemic attack (TIA), and cerebral infarction without residual deficits: Z86.73

## 2014-10-13 HISTORY — DX: Unspecified osteoarthritis, unspecified site: M19.90

## 2014-10-13 HISTORY — DX: Hyperlipidemia, unspecified: E78.5

## 2014-10-13 HISTORY — DX: Polyneuropathy, unspecified: G62.9

## 2014-10-13 HISTORY — DX: Anemia, unspecified: D64.9

## 2014-10-13 HISTORY — PX: PROCTOSCOPY: SHX2266

## 2014-10-13 HISTORY — DX: Personal history of colonic polyps: Z86.010

## 2014-10-13 LAB — BASIC METABOLIC PANEL
Anion gap: 10 (ref 5–15)
BUN: 42 mg/dL — ABNORMAL HIGH (ref 6–23)
CO2: 26 mmol/L (ref 19–32)
Calcium: 9.5 mg/dL (ref 8.4–10.5)
Chloride: 105 mmol/L (ref 96–112)
Creatinine, Ser: 1.33 mg/dL — ABNORMAL HIGH (ref 0.50–1.10)
GFR calc Af Amer: 42 mL/min — ABNORMAL LOW (ref >90)
GFR calc non Af Amer: 36 mL/min — ABNORMAL LOW (ref >90)
Glucose, Bld: 95 mg/dL (ref 70–99)
Potassium: 4.1 mmol/L (ref 3.5–5.1)
Sodium: 141 mmol/L (ref 135–145)

## 2014-10-13 LAB — CBC
HCT: 39.3 % (ref 36.0–46.0)
Hemoglobin: 12.5 g/dL (ref 12.0–15.0)
MCH: 29.3 pg (ref 26.0–34.0)
MCHC: 31.8 g/dL (ref 30.0–36.0)
MCV: 92 fL (ref 78.0–100.0)
Platelets: 163 10*3/uL (ref 150–400)
RBC: 4.27 MIL/uL (ref 3.87–5.11)
RDW: 13.5 % (ref 11.5–15.5)
WBC: 8.4 10*3/uL (ref 4.0–10.5)

## 2014-10-13 SURGERY — EXAM UNDER ANESTHESIA WITH ANAL FISTULECTOMY
Anesthesia: Monitor Anesthesia Care

## 2014-10-13 MED ORDER — ONDANSETRON HCL 4 MG/2ML IJ SOLN: 4.0000 mg | Freq: Once | INTRAMUSCULAR | Status: DC | PRN

## 2014-10-13 MED ORDER — SODIUM CHLORIDE 0.9 % IJ SOLN: 3.0000 mL | Freq: Two times a day (BID) | INTRAMUSCULAR | Status: DC

## 2014-10-13 MED ORDER — 0.9 % SODIUM CHLORIDE (POUR BTL) OPTIME
TOPICAL | Status: DC | PRN
  Administered 2014-10-13: 1000 mL

## 2014-10-13 MED ORDER — BUPIVACAINE-EPINEPHRINE (PF) 0.25% -1:200000 IJ SOLN
INTRAMUSCULAR | Status: AC
  Filled 2014-10-13: qty 30

## 2014-10-13 MED ORDER — METHYLENE BLUE 1 % INJ SOLN
INTRAMUSCULAR | Status: AC
  Filled 2014-10-13: qty 10

## 2014-10-13 MED ORDER — BUPIVACAINE-EPINEPHRINE (PF) 0.5% -1:200000 IJ SOLN
INTRAMUSCULAR | Status: AC
  Filled 2014-10-13: qty 30

## 2014-10-13 MED ORDER — FENTANYL CITRATE 0.05 MG/ML IJ SOLN
INTRAMUSCULAR | Status: AC
  Filled 2014-10-13: qty 2

## 2014-10-13 MED ORDER — SODIUM CHLORIDE 0.9 % IV SOLN: 250.0000 mL | INTRAVENOUS | Status: DC | PRN

## 2014-10-13 MED ORDER — ACETAMINOPHEN 650 MG RE SUPP
650.0000 mg | RECTAL | Status: DC | PRN
  Filled 2014-10-13: qty 1

## 2014-10-13 MED ORDER — METHYLENE BLUE 1 % INJ SOLN
INTRAMUSCULAR | Status: DC | PRN
  Administered 2014-10-13: 5 mL

## 2014-10-13 MED ORDER — SODIUM CHLORIDE 0.9 % IJ SOLN: 3.0000 mL | INTRAMUSCULAR | Status: DC | PRN

## 2014-10-13 MED ORDER — LIDOCAINE HCL (CARDIAC) 20 MG/ML IV SOLN
INTRAVENOUS | Status: DC | PRN
  Administered 2014-10-13: 80 mg via INTRAVENOUS

## 2014-10-13 MED ORDER — PROPOFOL 10 MG/ML IV BOLUS
INTRAVENOUS | Status: AC
  Filled 2014-10-13: qty 20

## 2014-10-13 MED ORDER — PROPOFOL INFUSION 10 MG/ML OPTIME
INTRAVENOUS | Status: DC | PRN
  Administered 2014-10-13: 100 ug/kg/min via INTRAVENOUS

## 2014-10-13 MED ORDER — FENTANYL CITRATE 0.05 MG/ML IJ SOLN: 25.0000 ug | INTRAMUSCULAR | Status: DC | PRN

## 2014-10-13 MED ORDER — SODIUM CHLORIDE 0.9 % IV SOLN
INTRAVENOUS | Status: DC
  Administered 2014-10-13: 1000 mL via INTRAVENOUS

## 2014-10-13 MED ORDER — FENTANYL CITRATE 0.05 MG/ML IJ SOLN
INTRAMUSCULAR | Status: DC | PRN
  Administered 2014-10-13 (×4): 25 ug via INTRAVENOUS

## 2014-10-13 MED ORDER — ACETAMINOPHEN 325 MG PO TABS: 650.0000 mg | ORAL_TABLET | ORAL | Status: DC | PRN

## 2014-10-13 SURGICAL SUPPLY — 37 items
BLADE HEX COATED 2.75 (ELECTRODE) ×2 IMPLANT
BLADE SURG 15 STRL LF DISP TIS (BLADE) ×1 IMPLANT
BLADE SURG 15 STRL SS (BLADE) ×1
BRIEF STRETCH FOR OB PAD LRG (UNDERPADS AND DIAPERS) ×4 IMPLANT
DECANTER SPIKE VIAL GLASS SM (MISCELLANEOUS) ×2 IMPLANT
DRSG PAD ABDOMINAL 8X10 ST (GAUZE/BANDAGES/DRESSINGS) IMPLANT
ELECT REM PT RETURN 9FT ADLT (ELECTROSURGICAL) ×2
ELECTRODE REM PT RTRN 9FT ADLT (ELECTROSURGICAL) ×1 IMPLANT
GAUZE SPONGE 4X4 12PLY STRL (GAUZE/BANDAGES/DRESSINGS) ×2 IMPLANT
GAUZE SPONGE 4X4 16PLY XRAY LF (GAUZE/BANDAGES/DRESSINGS) ×2 IMPLANT
GLOVE BIOGEL M STRL SZ7.5 (GLOVE) IMPLANT
GLOVE BIOGEL PI IND STRL 7.0 (GLOVE) ×1 IMPLANT
GLOVE BIOGEL PI INDICATOR 7.0 (GLOVE) ×1
GOWN STRL REUS W/TWL LRG LVL3 (GOWN DISPOSABLE) ×2 IMPLANT
KIT BASIN OR (CUSTOM PROCEDURE TRAY) ×2 IMPLANT
LUBRICANT JELLY K Y 4OZ (MISCELLANEOUS) ×2 IMPLANT
NDL SAFETY ECLIPSE 18X1.5 (NEEDLE) ×1 IMPLANT
NEEDLE HYPO 18GX1.5 SHARP (NEEDLE) ×1
NEEDLE HYPO 22GX1.5 SAFETY (NEEDLE) ×2 IMPLANT
NEEDLE HYPO 25X1 1.5 SAFETY (NEEDLE) ×2 IMPLANT
NS IRRIG 1000ML POUR BTL (IV SOLUTION) ×2 IMPLANT
PACK LITHOTOMY IV (CUSTOM PROCEDURE TRAY) ×2 IMPLANT
PENCIL BUTTON HOLSTER BLD 10FT (ELECTRODE) ×2 IMPLANT
SPONGE LAP 18X18 X RAY DECT (DISPOSABLE) ×2 IMPLANT
SPONGE SURGIFOAM ABS GEL 100 (HEMOSTASIS) IMPLANT
SPONGE SURGIFOAM ABS GEL 12-7 (HEMOSTASIS) IMPLANT
SUT CHROMIC 2 0 SH (SUTURE) IMPLANT
SUT CHROMIC 3 0 SH 27 (SUTURE) IMPLANT
SUT VIC AB 2-0 SH 27 (SUTURE)
SUT VIC AB 2-0 SH 27X BRD (SUTURE) IMPLANT
SUT VIC AB 4-0 SH 18 (SUTURE) IMPLANT
SYR 20CC LL (SYRINGE) ×2 IMPLANT
SYR CONTROL 10ML LL (SYRINGE) ×2 IMPLANT
SYRINGE IRR TOOMEY STRL 70CC (SYRINGE) ×2 IMPLANT
TOWEL OR 17X26 10 PK STRL BLUE (TOWEL DISPOSABLE) ×2 IMPLANT
UNDERPAD 30X30 INCONTINENT (UNDERPADS AND DIAPERS) ×2 IMPLANT
YANKAUER SUCT BULB TIP 10FT TU (MISCELLANEOUS) ×2 IMPLANT

## 2014-10-13 NOTE — Op Note (Signed)
10/13/2014  11:12 AM  PATIENT:  [name]  81 y.o. female  Patient Care Team: Marletta Lor, MD as PCP - General (Internal Medicine)  PRE-OPERATIVE DIAGNOSIS:  Rectovaginal fistula  POST-OPERATIVE DIAGNOSIS:  Normal anatomy  PROCEDURE:  VAGINAL AND ANAL EXAM UNDER ANESTHESIA  RIDGE PROCTOSCOPY  SURGEON:  Surgeon(s): [name], MD  ASSISTANT: none   ANESTHESIA:   MAC  SPECIMEN:  No Specimen  DISPOSITION OF SPECIMEN:  N/A  COUNTS:  YES  PLAN OF CARE: Discharge to home after PACU  PATIENT DISPOSITION:  PACU - hemodynamically stable.  INDICATION: This is an 60-year-old female with a history of a colovaginal fistula. This was presumed to be due to diverticulitis. She has been diverted for several months now.   OR FINDINGS: no signs of remaining colovaginal fistula  DESCRIPTION: the patient was identified in the preoperative holding area and taken to the OR where they were laid on the operating room table.  MAC anesthesia was induced without difficulty. The patient was then positioned in prone jackknife position with buttocks gently taped apart.  The patient was then prepped and draped in usual sterile fashion.  SCDs were noted to be in place prior to the initiation of anesthesia. A surgical timeout was performed indicating the correct patient, procedure, positioning and need for preoperative antibiotics.  I began with a digital vaginal exam.  There were no masses noted. I placed a speculum into the vaginaand no obvious fistula could be seen. There was a small punctate lesion on the posterior vaginal wall.  There did not appear to be any communication with the rectum on visualization. I then placed a Hill-Ferguson anoscope into the anal canal and evaluated this completely.  There were no masses.The distal rectum appeared normal.I then placed a proctoscope into the anal canal and advanced to approximately 12 cm. There was no obvious fistula noted.  I was not able to get  into the sigmoid colon due to scarring. I injected saline tinted with methylene blue into the sigmoid colon using the proctoscope.  I placed a moist lap sponge in the vaginal canal. There was no extravasation of contrast into the vagina.  At this point we irrigated the rectum with normal saline. We then completed the procedure. All counts were correct per operating room staff. The patient was awakened from anesthesia and sent to the post anesthesia care unit in stable condition.

## 2014-10-13 NOTE — H&P (View-Only) (Signed)
[name] 10/04/2014 9:07 AM Location: [facility] Patient #: [id number] DOB: 11/22/1929 Married / Language: English / Race: White Female History of Present Illness Whitney Ruff MD; 12/14/930 9:56 AM) Patient words: colostomy reversal.  The patient is a 81 year old female who presents with anal itching. Patient presented to the office with a colovaginal fistula. Subsequently she was admitted to the hospital with an obstructing right-sided colon cancer. She underwent a right hemicolectomy and diverting colostomy several months ago with Dr. Georgette Dover. She is now recovered from the surgery. She has undergone a rectal contrast CT which shows no rectovaginal fistula. There is some stranding noted which appears to be where the fistula was. She does report some vaginal drainage after the CT scan, but this has cleared up now. Other Problems Whitney Ruff, MD; 3/55/7322 9:59 AM) Anxiety Disorder Arthritis Back Pain Cholelithiasis Gastric Ulcer Gastroesophageal Reflux Disease General anesthesia - complications Heart murmur High blood pressure Inguinal Hernia Transfusion history COLOVAGINAL FISTULA (619.1  N82.4) Patient with signs and system consistent with colovaginal fistula. It is unclear to me whether this has closed completely or not. She would like to have colostomy reversal. I have recommended that she undergo exam under anesthesia with rigid sigmoidoscopy to evaluate for fistula patency. COLON CANCER METASTASIZED TO MESENTERIC LYMPH NODES (153.9  C18.9) VAGINAL DISCHARGE (623.5  N89.8) Pt is a 81yo F with multiple medical problems who presents to me with concern for colovaginal fistula. CT does not show a communication. POSTOP CHECK (V67.00  Z09) POST-OPERATIVE STATE (V45.89  Z98.33) This 81 year old female who presents to the office with concerns of wound infection after surgery 6 weeks ago. She underwent a right hemicolectomy and diverting end  colostomy due to a obstructing right colon mass and a colovaginal fistula. POSTOPERATIVE WOUND INFECTION, INITIAL ENCOUNTER (998.59  T81.0URK)  Past Surgical History Whitney Ruff, MD; 2/70/6237 9:59 AM) Appendectomy Cataract Surgery Bilateral. Colon Polyp Removal - Colonoscopy Foot Surgery Right. Gallbladder Surgery - Open Hysterectomy (not due to cancer) - Complete Knee Surgery Bilateral. Shoulder Surgery Bilateral. Spinal Surgery - Lower Back Spinal Surgery - Neck Tonsillectomy  Diagnostic Studies History Whitney Ruff, MD; 01/09/3150 9:59 AM) Colonoscopy 5-10 years ago Mammogram >3 years ago Pap Smear 1-5 years ago  Allergies Whitney Lorenzo, LPN; 7/61/6073 7:10 AM) Penicillin G Pot in Dextrose *PENICILLINS* IV Potassium No Known Drug Allergies 10/04/2014 (Marked as Inactive)  Medication History Whitney Lorenzo, LPN; 01/08/9484 4:62 AM) Tylenol Extra Strength (500MG  Tablet, Oral) Active. Vitamin C (Oral) Active. Vitamin D (400UNIT Capsule, Oral) Active. Ferrous Sulfate (324 (65 Fe)MG Tablet DR, Oral) Active. Neurontin (600MG  Tablet, Oral) Active. Norco (5-325MG  Tablet, Oral) Active. Lopressor HCT (50-25MG  Tablet, Oral) Active. Multivitamin Adults 50+ (Oral) Active. Nitrostat (0.4MG  Tab Sublingual, Sublingual) Active. Ultram (50MG  Tablet, Oral) Active. Desyrel (100MG  Tablet, Oral) Active. LORazepam (0.5MG  Tablet, Oral) Active. Promethazine HCl (12.5MG  Tablet, Oral) Active. Triamcinolone Acetonide (0.1% Cream, External) Active. Medications Reconciled  Social History Whitney Ruff, MD; 01/15/5008 9:59 AM) Caffeine use Coffee. No alcohol use No drug use Tobacco use Former smoker.  Family History Whitney Ruff, MD; 3/81/8299 9:59 AM) Arthritis Mother. Cancer Brother. Colon Cancer Sister. Colon Polyps Daughter, Son. Heart Disease Brother, Father. Heart disease in female family member before age 30 Hypertension Daughter,  Mother.    Vitals Whitney Billings Dockery LPN; 3/71/6967 8:93 AM) 10/04/2014 9:08 AM Weight: 167.13 lb Height: 63in Body Surface Area: 1.84 m Body Mass Index: 29.6 kg/m Temp.: 97.57F  Pulse: 54 (Regular)  BP: 118/76 (Sitting, Left Arm, Standard)  Physical Exam Whitney Ruff MD; 0/38/8828 9:57 AM)  General Mental Status-Alert. General Appearance-Consistent with stated age. Hydration-Well hydrated. Voice-Normal.  Head and Neck Head-normocephalic, atraumatic with no lesions or palpable masses. Trachea-midline. Thyroid Gland Characteristics - normal size and consistency.  Eye Eyeball - Bilateral-Extraocular movements intact. Sclera/Conjunctiva - Bilateral-No scleral icterus.  Chest and Lung Exam Chest and lung exam reveals -quiet, even and easy respiratory effort with no use of accessory muscles and on auscultation, normal breath sounds, no adventitious sounds and normal vocal resonance. Inspection Chest Wall - Normal. Back - normal.  Cardiovascular Cardiovascular examination reveals -normal heart sounds, regular rate and rhythm with no murmurs and normal pedal pulses bilaterally.  Abdomen Inspection Inspection of the abdomen reveals - No Hernias. Palpation/Percussion Palpation and Percussion of the abdomen reveal - Soft, Non Tender, No Rebound tenderness, No Rigidity (guarding) and No hepatosplenomegaly. Auscultation Auscultation of the abdomen reveals - Bowel sounds normal.  Rectal Anorectal Exam External - normal external exam. Internal - normal internal exam.  Neurologic Neurologic evaluation reveals -alert and oriented x 3 with no impairment of recent or remote memory. Mental Status-Normal.  Musculoskeletal Global Assessment -Note:no gross deformities.  Normal Exam - Left-Upper Extremity Strength Normal and Lower Extremity Strength Normal. Normal Exam - Right-Upper Extremity Strength Normal and Lower Extremity  Strength Normal.    Assessment & Plan Whitney Ruff MD; 0/09/4915 9:58 AM)  COLOVAGINAL FISTULA (619.1  N82.4) Story: Patient with signs and system consistent with colovaginal fistula. It is unclear to me whether this has closed completely or not. She would like to have colostomy reversal. I have recommended that she undergo exam under anesthesia with rigid sigmoidoscopy to evaluate for fistula patency.

## 2014-10-13 NOTE — Anesthesia Postprocedure Evaluation (Signed)
  Anesthesia Post-op Note  Patient: [name]  Procedure(s) Performed: Procedure(s) (LRB): ANAL EXAM UNDER ANESTHESIA  (N/A) RIDGE PROCTOSCOPY (N/A)  Patient Location: PACU  Anesthesia Type: MAC  Level of Consciousness: awake and alert   Airway and Oxygen Therapy: Patient Spontanous Breathing  Post-op Pain: mild  Post-op Assessment: Post-op Vital signs reviewed, Patient's Cardiovascular Status Stable, Respiratory Function Stable, Patent Airway and No signs of Nausea or vomiting  Last Vitals:  Filed Vitals:   10/13/14 1130  BP: 119/58  Pulse: 57  Temp:   Resp: 18    Post-op Vital Signs: stable   Complications: No apparent anesthesia complications

## 2014-10-13 NOTE — Transfer of Care (Signed)
Immediate Anesthesia Transfer of Care Note  Patient: [name]  Procedure(s) Performed: Procedure(s): ANAL EXAM UNDER ANESTHESIA  (N/A) RIDGE PROCTOSCOPY (N/A)  Patient Location: PACU  Anesthesia Type:MAC  Level of Consciousness: awake and alert   Airway & Oxygen Therapy: Patient Spontanous Breathing and Patient connected to face mask oxygen  Post-op Assessment: Report given to RN and Post -op Vital signs reviewed and stable  Post vital signs: Reviewed and stable  Last Vitals:  Filed Vitals:   10/13/14 0726  BP: 131/92  Pulse: 65  Temp: 36.4 C  Resp: 18    Complications: No apparent anesthesia complications

## 2014-10-13 NOTE — Discharge Instructions (Signed)
Home Instructions  1. DIET: Follow a light bland diet the first 24 hours after arrival home, such as soup, liquids, crackers, etc.  Be sure to include lots of fluids daily.  Avoid fast food or heavy meals as your are more likely to get nauseated.   2. You Clair have some mild rectal bleeding for the first few days after the procedure.  This should get less and less with time.  Resume any blood thinners 2 days after your procedure unless directed otherwise by your physician. 3. Take your usually prescribed home medications unless otherwise directed. a. If you have any pain, it is helpful to get up and walk around, as it is usually from excess gas. b. If this is not helpful, you can take an over-the-counter pain medication.  Choose one of the following that works best for you: i. Naproxen (Aleve, etc)  Two 220mg  tabs twice a day ii. Ibuprofen (Advil, etc) Three 200mg  tabs four times a day (every meal & bedtime) iii. If you still have pain after using one of these, please call the office 4. It is normal to not have a bowel movement for 2-3 days after colonoscopy.    5. ACTIVITIES as tolerated:   6. You Franze resume regular (light) daily activities beginning the next day--such as daily self-care, walking, climbing stairs--gradually increasing activities as tolerated.    WHEN TO CALL us [phone]: 1. Fever over 101.5 F (38.5 C)  2. Severe abdominal or chest pain  3. Large amount of rectal bleeding, passing multiple blood clots  4. Dizziness or shortness of breath 5. Increasing nausea or vomiting   The clinic staff is available to answer your questions during regular business hours (8:30am-5pm).  Please dont hesitate to call and ask to speak to one of our nurses for clinical concerns.   If you have a medical emergency, go to the nearest emergency room or call 911.  A surgeon from [facility] is always on call at the [facility], [place], [place], [place], [place]  [address] ? MAIN: [phone] ? TOLL FREE: [phone] ?  FAX [phone] [email or web address]

## 2014-10-13 NOTE — Interval H&P Note (Signed)
History and Physical Interval Note:  10/13/2014 9:48 AM  [name]  has presented today for surgery, with the diagnosis of RECTRO VAGINAL FISTULAR  The various methods of treatment have been discussed with the patient and family. After consideration of risks, benefits and other options for treatment, the patient has consented to  Procedure(s): ANAL EXAM UNDER ANESTHESIA WITH POSSIBLE RECTOVAGINAL FISTULA REPAIR (N/A) RIDGE PROCTOSCOPY (N/A) as a surgical intervention .  The patient's history has been reviewed, patient examined, no change in status, stable for surgery.  I have reviewed the patient's chart and labs.  Questions were answered to the patient's satisfaction.     Rosario Adie, MD  [facility]

## 2014-10-14 ENCOUNTER — Encounter (HOSPITAL_COMMUNITY): Payer: Self-pay | Admitting: General Surgery

## 2014-10-17 ENCOUNTER — Ambulatory Visit: Payer: Self-pay | Admitting: Surgery

## 2014-10-17 NOTE — H&P (Signed)
History of Present Illness [name]. Whitney Mittelstadt MD; 10/17/2014 2:33 PM) Patient words: f/u.  The patient is a 81 year old female who presents with colorectal cancer. The patient is a 81 year old female presenting for a post-operative visit. s/p right hemicolectomy with primary anastomosis on 06/02/14 for an obstructing tumor of the ileocecal valve. She also underwent descending colostomy for rectovaginal fistula. Her pathology report showed two separate tumors with positive lymph nodes. She was evaluated by Oncology during hospitalization and the decision was made to not proceed with any further therapy. She was discharged to rehab on 06/10/14. She reports that her appetite is improving and that her ostomy continues to function. Her incision is completely healed. Last week, she underwent a vaginal and anal examination under anesthesia by Dr. [name]. There were no signs of any remaining colovaginal fistula. However, her sigmoid colon was significantly scarred and strictured. A recent CT scan showed extensive diverticulosis of the sigmoid colon. After discussion with Dr. Marcello Moores, there is concern that if we just closed her colostomy, she could have future problems with sigmoid stricture or even recurrent diverticulitis. She is here today to discuss these recent findings and to plan her next surgery. She is eager to get rid of her colostomy.  She has had no further vaginal drainage  Her appetite and energy level are improving. Her mobility is limited by her left knee problems.   CLINICAL DATA: History of rectovaginal fistula after descending colostomy, history right hemicolectomy for carcinoma.  EXAM: CT ABDOMEN AND PELVIS WITH CONTRAST  TECHNIQUE: Multidetector CT imaging of the abdomen and pelvis was performed using the standard protocol following bolus administration of intravenous contrast.  CONTRAST: 100 cc Isovue-300, and rectal contrast  COMPARISON: CT abdomen pelvis of  05/31/2014  FINDINGS: The lung bases are clear. A moderate size hiatal hernia again is noted. Mild cardiomegaly is stable. The liver enhances with no focal abnormality and no ductal dilatation is seen. Surgical clips are present from prior cholecystectomy. The pancreas is normal in size and the pancreatic duct is not dilated. The adrenal glands and spleen are unremarkable. The stomach is decompressed. The kidneys enhance with no calculus or mass, and on delayed images, the pelvocaliceal systems are unremarkable. The proximal ureters are normal in caliber. The abdominal aorta is normal in caliber with only mild atheromatous change for age. No adenopathy is seen.  An ostomy is noted in the left lower quadrant. Rectal contrast was administered. In this patient with a history of rectovaginal fistula, no contrast enters the vagina or the urinary bladder. However, on several images there is linear soft tissue extension from the sigmoid colon toward the vagina which [place] represent the area of prior fistulous communication which is not documented after rectal contrast, with no contrast seen to enter that linear extension. Multiple rectosigmoid colon diverticula again are noted. No present evidence of diverticulitis is seen. No free fluid is noted within the pelvis. The urinary bladder is not well distended but no abnormality is seen. Moderate degenerative joint disease is noted involving both hips. There are diffuse degenerative changes throughout the lumbar spine with mild scoliosis present. No compression deformity is seen.  IMPRESSION: *Although there is still a soft tissue linear extension noted extending from the region of the sigmoid colon toward the vagina, no contrast enters this extension and no air is seen within the vagina or the urinary bladder to suggest the presence of persistent fistulous communication. *Multiple diverticula within the rectosigmoid colon. *Stable moderate  size hiatal hernia   Electronically Signed By: Ivar Drape M.D. On: 09/14/2014 08:52 Allergies (Ammie Eversole, LPN; 07/17/7515 0:01 AM) Penicillin G Pot in Dextrose *PENICILLINS* IV Potassium No Known Drug Allergies 10/04/2014 (Marked as Inactive)  Medication History (Ammie Eversole, LPN; 01/16/9448 6:75 AM) Keflex (500MG  Capsule, 1 (one) Capsule Oral three times daily, Taken starting 07/25/2014) Active. Promethazine HCl (12.5MG  Tablet, Oral) Active. Triamcinolone Acetonide (0.1% Cream, External) Active. Baclofen (10MG  Tablet, Oral) Active. Lotensin (40MG  Tablet, Oral) Active. Lasix (20MG  Tablet, Oral) Active. K-Dur [facility] Tablet ER, Oral) Active. Florastor (250MG  Capsule, Oral) Active. Zoloft (50MG  Tablet, Oral) Active. Zocor (40MG  Tablet, Oral) Active. Cipro XR (500MG  Tablet ER 24HR, Oral) Active. Hydrocodone-Acetaminophen (5-325MG  Tablet, Oral) Active. Benazepril HCl (40MG  Tablet, Oral) Active. Furosemide (20MG  Tablet, Oral) Active. Gabapentin (600MG  Tablet, Oral) Active. Metoprolol Tartrate (50MG  Tablet, Oral) Active. MetroNIDAZOLE (500MG  Tablet, Oral) Active. Sertraline HCl (50MG  Tablet, Oral) Active. Simvastatin (40MG  Tablet, Oral) Active. Spironolactone (25MG  Tablet, Oral) Active. TraZODone HCl (100MG  Tablet, Oral) Active. Tylenol Extra Strength (500MG  Tablet, Oral) Active. Vitamin C (Oral) Active. Vitamin D (400UNIT Capsule, Oral) Active. Ferrous Sulfate (324 (65 Fe)MG Tablet DR, Oral) Active. Neurontin (600MG  Tablet, Oral) Active. Norco (5-325MG  Tablet, Oral) Active. Lopressor HCT (50-25MG  Tablet, Oral) Active. Multivitamin Adults 50+ (Oral) Active. Nitrostat (0.4MG  Tab Sublingual, Sublingual) Active. Ultram (50MG  Tablet, Oral) Active. Desyrel (100MG  Tablet, Oral) Active. LORazepam (0.5MG  Tablet, Oral) Active.    Vitals (Ammie Eversole LPN; 03/16/6383 6:65 AM) 10/17/2014 9:42 AM Weight: 171.2 lb Height: 63in Body Surface  Area: 1.86 m Body Mass Index: 30.33 kg/m Temp.: 97.46F(Oral)  Pulse: 88 (Regular)  BP: 168/80 (Sitting, Left Arm, Standard)     Physical Exam Rodman Key K. Daesia Zylka MD; 10/17/2014 2:32 PM)  The physical exam findings are as follows: Note:WDWN in NAD Lungs - CTA B CV - RRR Abd - soft, healed midline incision; LLQ ostomy - pink with brown stool output No abdominal tenderness. Extremities - some swelling around left knee    Assessment & Plan Rodman Key K. Birch Farino MD; 10/17/2014 2:32 PM)  COLOVAGINAL FISTULA (619.1  N82.4) Story: Patient with signs and system consistent with colovaginal fistula. It is unclear to me whether this has closed completely or not. She would like to have colostomy reversal. I have recommended that she undergo exam under anesthesia with rigid sigmoidoscopy to evaluate for fistula patency.  COLON CANCER METASTASIZED TO MESENTERIC LYMPH NODES (153.9  C18.9)  Current Plans Schedule for Surgery - sigmoid colectomy/ colostomy reversal. The surgical procedure has been discussed with the patient. Potential risks, benefits, alternative treatments, and expected outcomes have been explained. All of the patient's questions at this time have been answered. The likelihood of reaching the patient's treatment goal is good. The patient understand the proposed surgical procedure and wishes to proceed.   [name]. Georgette Dover, MD, [facility]  General/ Trauma Surgery  10/17/2014 2:33 PM

## 2014-10-24 ENCOUNTER — Telehealth: Payer: Self-pay | Admitting: *Deleted

## 2014-10-24 NOTE — Telephone Encounter (Signed)
Received call from pt's daughter trying to coordinate lab draws.  She states her mother has an appt tomorrow for lab & MD & wonders if she needs this.  She had labs done 10/13/14 which was a cbc & BMET & plans to have colostomy reversal 5/12 with pre-op labs 11/16/14.  Labs ordered for tomorrow are CBC, CMET & CEA.  We do not know what labs she needs for pre-op.  Called [facility] & asked if our labs will be good for them & was informed that they should be since will be less than 30 days.  They will check with lab personal.  Note to Dr Burr Medico.

## 2014-10-25 ENCOUNTER — Other Ambulatory Visit (HOSPITAL_BASED_OUTPATIENT_CLINIC_OR_DEPARTMENT_OTHER): Payer: Commercial Managed Care - HMO

## 2014-10-25 ENCOUNTER — Encounter: Payer: Self-pay | Admitting: Hematology

## 2014-10-25 ENCOUNTER — Telehealth: Payer: Self-pay | Admitting: Hematology

## 2014-10-25 ENCOUNTER — Ambulatory Visit (HOSPITAL_BASED_OUTPATIENT_CLINIC_OR_DEPARTMENT_OTHER): Payer: Commercial Managed Care - HMO | Admitting: Hematology

## 2014-10-25 VITALS — BP 126/67 | HR 62 | Temp 98.3°F | Resp 18 | Ht 63.0 in | Wt 167.1 lb

## 2014-10-25 DIAGNOSIS — C182 Malignant neoplasm of ascending colon: Secondary | ICD-10-CM | POA: Diagnosis not present

## 2014-10-25 DIAGNOSIS — C189 Malignant neoplasm of colon, unspecified: Secondary | ICD-10-CM

## 2014-10-25 LAB — CBC WITH DIFFERENTIAL/PLATELET
BASO%: 0.4 % (ref 0.0–2.0)
Basophils Absolute: 0 10*3/uL (ref 0.0–0.1)
EOS%: 1 % (ref 0.0–7.0)
Eosinophils Absolute: 0.1 10*3/uL (ref 0.0–0.5)
HCT: 38.2 % (ref 34.8–46.6)
HGB: 12.3 g/dL (ref 11.6–15.9)
LYMPH%: 20.5 % (ref 14.0–49.7)
MCH: 29.6 pg (ref 25.1–34.0)
MCHC: 32.2 g/dL (ref 31.5–36.0)
MCV: 91.8 fL (ref 79.5–101.0)
MONO#: 0.6 10*3/uL (ref 0.1–0.9)
MONO%: 7.9 % (ref 0.0–14.0)
NEUT#: 5.6 10*3/uL (ref 1.5–6.5)
NEUT%: 70.2 % (ref 38.4–76.8)
Platelets: 168 10*3/uL (ref 145–400)
RBC: 4.16 10*6/uL (ref 3.70–5.45)
RDW: 13.3 % (ref 11.2–14.5)
WBC: 7.9 10*3/uL (ref 3.9–10.3)
lymph#: 1.6 10*3/uL (ref 0.9–3.3)

## 2014-10-25 LAB — COMPREHENSIVE METABOLIC PANEL (CC13)
ALT: 16 U/L (ref 0–55)
AST: 23 U/L (ref 5–34)
Albumin: 3.9 g/dL (ref 3.5–5.0)
Alkaline Phosphatase: 68 U/L (ref 40–150)
Anion Gap: 9 mEq/L (ref 3–11)
BUN: 40.5 mg/dL — ABNORMAL HIGH (ref 7.0–26.0)
CO2: 25 mEq/L (ref 22–29)
Calcium: 9.5 mg/dL (ref 8.4–10.4)
Chloride: 106 mEq/L (ref 98–109)
Creatinine: 1.3 mg/dL — ABNORMAL HIGH (ref 0.6–1.1)
EGFR: 37 mL/min/{1.73_m2} — ABNORMAL LOW (ref >90)
Glucose: 95 mg/dl (ref 70–140)
Potassium: 4.8 mEq/L (ref 3.5–5.1)
Sodium: 140 mEq/L (ref 136–145)
Total Bilirubin: 0.33 mg/dL (ref 0.20–1.20)
Total Protein: 7.7 g/dL (ref 6.4–8.3)

## 2014-10-25 LAB — CEA: CEA: 1.5 ng/mL (ref 0.0–5.0)

## 2014-10-25 NOTE — Progress Notes (Signed)
[name]  [phone] Fax:([phone]) [place] Note   Patient Care Team: Marletta Lor, MD as PCP - General (Internal Medicine) 10/25/2014  CHIEF COMPLAINTS/PURPOSE OF CONSULTATION:  Multifocal colon cancer    Adenocarcinoma, colon   06/02/2014 Initial Diagnosis Adenocarcinoma, colon. she presented with anemia, nauseam anorexia and abdominal pain and was admitted to [facility] on 05/31/2014.   06/02/2014 Surgery exploratory laparotomy, right hemicolectomy, and descending colostomy by Dr. Georgette Dover   06/02/2014 Pathologic Stage multifocal (2) colon cancer at  ileocecal valve and ascending colon.,  mT4N1Mx, at least stage IIIB, grade 1, MMR normal, surgical margins negative.    06/02/2014 Tumor Marker CEA 1.7     HISTORY OF PRESENTING ILLNESS:  [name] 81 y.o. female is here because of recently diagnosed and resected multifocal colon cancer. I saw him when she was admitted to the hospital, and she is here with her family members for follow-up.  She has a history of chronic anemia and colovaginal fistula as well as diverticulosis with colonic polyps since 2008, was admitted to [facility] on 05/31/2014 With progressive abdominal pain, worse over the last couple of weeks, accompanied by nausea and vomiting, without weight loss. Workup included a CT of the abdomen and pelvis with contrast, which revealed acute small bowel obstruction due to what appears to be a soft tissue mass (apple core lesion) at the ileocecal valve suspicious for intestinal adenocarcinoma.This mass measures 6 x 4 cm. Up to 8 mm stable pericecal lymph nodes were visualized. No liver or distant metastasis identified. Small volume free fluid in the abdomen and pelvis, Likely reactive, was seen. No free air.  She underwent exploratory laparotomy, right hemicolectomy, and descending colostomy by Dr. Georgette Dover on 06/02/2014. Grossly, there are two tumors identified. The larger  tumor is located at the ileocecal valve and consists of an 8.5 cm span of moderately differentiated adenocarcinoma that involves the serosa. This tumor has associated perineural and lymphovascular invasion. The second discontiguous tumor is present in the proximal right colon and spans 4.0 cm and consists of well differentiated adenocarcinoma that is morphologically dissimilar from the larger tumor. This tumor extends into the muscularis propria.Her CEA is 1.7. It is stage mT4, N1b. immunohistochemical stains are normal, with very low probability of microsatellite instability (See details below ).  She was discharged to rehabilitation after her surgery. She has been doing well at to rehabilitation. She had a surgical wound infection a few weeks after surgery, which was treated with oral antibiotics and wound care. She was not very physically active before the surgery due to her multiple arthritis, she is recovering well at the nursing home and is likely going home in a few days. She is able to move around with a walker, has good appetite, surgical site pain is controlled, no nausea or other complaints. She did developed some UTI symptoms, and started Cipro and probiotics yesterday at the nursing home.  INTERIM HISTORY She returns for follow up. She is doing well overall. She lives alone, is able to take him herself. Her appetite and energy level has been better in the past months. Her abdominal surgical wound has healed well. She has loose bowel movement most of time, she changes her colostomy back 2-3 times a day. She denies any abdominal pain, nausea or other new complains.    MEDICAL HISTORY:  Past Medical History  Diagnosis Date  . PONV (postoperative nausea and vomiting)     Pt had problems  with memory after procedure  . Headache(784.0)   . CORONARY ARTERY DISEASE     BMS x2 to RCA 10/2007  . Hypertension   . Diastolic CHF, chronic     grade I  . History of TIA (transient ischemic attack)     . Hyperlipidemia   . Depression   . COPD with chronic bronchitis   . GERD (gastroesophageal reflux disease)   . History of hiatal hernia   . History of DVT of lower extremity   . Sigmoid diverticulosis   . Colovaginal fistula     chronic  . History of adenomatous polyp of colon   . Renal insufficiency   . OA (osteoarthritis)   . DJD (degenerative joint disease)   . Wears glasses   . Wears dentures   . Colostomy in place   . S/p bare metal coronary artery stent     11-04-2007  to RCA  . Peripheral neuropathy   . Adenocarcinoma, colon Oncologist-- dr Truitt Merle ([facility])    dx Multifocal (2) Colon cancer @ ileocecal valve and ascending ( mT4N1Mx), Stage IIIB, grade I, MMR normal , 2 of 16 +lymph nodes, negative surgical margins---  06-02-2014  Right hemicolectomy w/ colostomy  . Chronic anemia     SURGICAL HISTORY: Past Surgical History  Procedure Laterality Date  . Appendectomy    . Cholecystectomy    . Abdominal hysterectomy    . Lumbar laminectomy  10-29-2002    Left  L3 -- 4  decompression  . Total knee arthroplasty Bilateral left 1994/  right 2000  . Rotator cuff repair Bilateral   . Cataract extraction w/ intraocular lens  implant, bilateral Bilateral   . Tonsillectomy    . Dilation and curettage of uterus    . Patellectomy Right 09/14/2013    Procedure: PATELLECTOMY;  Surgeon: Meredith Pel, MD;  Location: [facility];  Service: Orthopedics;  Laterality: Right;  . Partial colectomy N/A 06/02/2014    Procedure: RIGHT PARTIAL COLECTOMY ;  Surgeon: Donnie Mesa, MD;  Location: [facility];  Service: General;  Laterality: N/A;  . Colostomy N/A 06/02/2014    Procedure: DIVERTING DESCENDING END COLOSTOMY;  Surgeon: Donnie Mesa, MD;  Location: [facility];  Service: General;  Laterality: N/A;  . Coronary angioplasty with stent placement  11-04-2007  dr bensimhon    BMS x2 to RCA/  mild Non-obstructive disease LAD/  normal LVF  . Revision total knee arthroplasty Bilateral right   04-02-2011/  left 1996 & 10-05-1999  . Anterior cervical decomp/discectomy fusion  01-31-2009    C5 -- 7  . Cardiovascular stress test  02-26-2011    normal lexiscan no exercise study/  no ischemia/  normal LV function and wall motion, ef 67%  . Transthoracic echocardiogram  12-01-2009    grade I diastolic dysfunction/  ef 60%/  moderate MR/  mild TR  . Evaluation under anesthesia with anal fistulectomy N/A 10/13/2014    Procedure: ANAL EXAM UNDER ANESTHESIA ;  Surgeon: [name], MD;  Location: [facility];  Service: General;  Laterality: N/A;  . Proctoscopy N/A 10/13/2014    Procedure: RIDGE PROCTOSCOPY;  Surgeon: [name], MD;  Location: [facility];  Service: General;  Laterality: N/A;    SOCIAL HISTORY: History   Social History  . Marital Status: Married    Spouse Name: N/A  . Number of Children: N/A  . Years of Education: N/A   Occupational History  . Not on file.   Social History Main  Topics  . Smoking status: Former Smoker -- 0.50 packs/day for 15 years    Types: Cigarettes    Quit date: 07/16/1967  . Smokeless tobacco: Never Used  . Alcohol Use: No  . Drug Use: No  . Sexual Activity: Not on file   Other Topics Concern  . Not on file   Social History Narrative   Married, 2 children.  As of November 2015 her husband has been living in a nursing home for tendon after years as he suffers from multiple medical problems.   Patient does not drink alcohol nor does she smoke or chew tobacco products   Right handed   8th grade   1 cup daily   She worked for [employer] for 4 years.    FAMILY HISTORY: Family History  Problem Relation Age of Onset  . Osteoarthritis Mother   . Heart failure Mother   . Cancer Sister 39    ? colon cancer   . Cancer Brother 30    kidney cancer   . Cancer Maternal Uncle 11    colon cancer     ALLERGIES:  is allergic to potassium-containing compounds.  MEDICATIONS:  Current Outpatient Prescriptions  Medication Sig Dispense Refill    . acetaminophen (TYLENOL) 325 MG tablet Take 2 tablets (650 mg total) by mouth every 6 (six) hours as needed for mild pain (or Fever >/= 101).    . Ascorbic Acid (VITAMIN C) 500 MG CAPS Take 1 tablet by mouth daily.     . cholecalciferol (VITAMIN D) 1000 UNITS tablet Take 1,000 Units by mouth daily.    . ferrous sulfate 325 (65 FE) MG tablet Take 1 tablet (325 mg total) by mouth 2 (two) times daily with a meal. (Patient taking differently: Take 325 mg by mouth daily with breakfast. )  3  . gabapentin (NEURONTIN) 600 MG tablet Take 600 mg by mouth daily.    Marland Kitchen HYDROcodone-acetaminophen (NORCO) 5-325 MG per tablet Take 1-2 tablets by mouth every 6 (six) hours as needed for moderate pain. 120 tablet 0  . LORazepam (ATIVAN) 0.5 MG tablet Take 1 tablet (0.5 mg total) by mouth 2 (two) times daily as needed for anxiety or sleep. 60 tablet 2  . metoprolol (LOPRESSOR) 50 MG tablet Take 50 mg by mouth 2 (two) times daily.    . Multiple Vitamins-Minerals (MULTIVITAMIN WITH MINERALS) tablet Take 1 tablet by mouth daily as needed.     . nitroGLYCERIN (NITROSTAT) 0.4 MG SL tablet Place 0.4 mg under the tongue every 5 (five) minutes as needed for chest pain.     Marland Kitchen sertraline (ZOLOFT) 50 MG tablet TAKE 1 TABLET EVERY DAY 90 tablet 1  . traMADol (ULTRAM) 50 MG tablet Take 1 tablet (50 mg total) by mouth every 8 (eight) hours. (Patient taking differently: Take 50 mg by mouth every 8 (eight) hours as needed. ) 60 tablet 2  . traZODone (DESYREL) 50 MG tablet Take 50 mg by mouth at bedtime.     . triamcinolone cream (KENALOG) 0.1 % Apply 1 application topically 2 (two) times daily as needed (Eczema on legs).    . benazepril (LOTENSIN) 40 MG tablet Take 1 tablet by mouth daily. On hold until next follow up    . promethazine (PHENERGAN) 12.5 MG tablet Take 1 tablet by mouth every 6 (six) hours as needed. Nausea/vomitting    . simvastatin (ZOCOR) 40 MG tablet Take 1 tablet (40 mg total) by mouth at bedtime. (Patient not  taking: Reported  on 10/25/2014) 90 tablet 3   No current facility-administered medications for this visit.    REVIEW OF SYSTEMS:   Constitutional: Denies fevers, chills or abnormal night sweats Eyes: Denies blurriness of vision, double vision or watery eyes Ears, nose, mouth, throat, and face: Denies mucositis or sore throat Respiratory: Denies cough, dyspnea or wheezes Cardiovascular: Denies palpitation, chest discomfort or lower extremity swelling Gastrointestinal:  Denies nausea, heartburn or change in bowel habits Skin: Denies abnormal skin rashes Lymphatics: Denies new lymphadenopathy or easy bruising Neurological:Denies numbness, tingling or new weaknesses Behavioral/Psych: Mood is stable, no new changes  All other systems were reviewed with the patient and are negative.  PHYSICAL EXAMINATION: ECOG PERFORMANCE STATUS: 1-2  Filed Vitals:   10/25/14 1012  BP: 126/67  Pulse: 62  Temp: 98.3 F (36.8 C)  Resp: 18   Filed Weights   10/25/14 1012  Weight: 167 lb 1.6 oz (75.796 kg)    GENERAL:alert, no distress and comfortable SKIN: skin color, texture, turgor are normal, no rashes or significant lesions EYES: normal, conjunctiva are pink and non-injected, sclera clear OROPHARYNX:no exudate, no erythema and lips, buccal mucosa, and tongue normal  NECK: supple, thyroid normal size, non-tender, without nodularity LYMPH:  no palpable lymphadenopathy in the cervical, axillary or inguinal LUNGS: clear to auscultation and percussion with normal breathing effort HEART: regular rate & rhythm and no murmurs and no lower extremity edema ABDOMEN:abdomen soft, the midline surgical wound is well healed, appears clean. (+) Colostomy back at left lower quadrant of her abdomen. non-tender and normal bowel sounds Musculoskeletal:no cyanosis of digits and no clubbing  PSYCH: alert & oriented x 3 with fluent speech NEURO: no focal motor/sensory deficits.   LABORATORY DATA:  I have reviewed  the data as listed Lab Results  Component Value Date   WBC 7.9 10/25/2014   HGB 12.3 10/25/2014   HCT 38.2 10/25/2014   MCV 91.8 10/25/2014   PLT 168 10/25/2014    Recent Labs  06/10/14 0845  07/26/14 0932 08/22/14 1125 08/25/14 1117 10/13/14 0750 10/25/14 0953  NA 140  < > 140 140 136 141 140  K 4.1  < > 5.1 4.3 4.8 4.1 4.8  CL 102  --   --  105 103 105  --   CO2 28  --  _0 GLUCOSE 96  --  65* 102* 71 95 95  BUN 16  < > 30.5* 41* 32* 42* 40.5*  CREATININE 0.81  < > 1.3* 2.21* 1.14 1.33* 1.3*  CALCIUM 8.4  --  9.3 9.3 9.6 9.5 9.5  GFRNONAA 66*  --   --  20*  --  36*  --   GFRAA 76*  --   --  23*  --  42*  --   PROT  --   --  7.7 7.5  --   --  7.7  ALBUMIN  --   --  3.8 3.9  --   --  3.9  AST  --   --  22 39*  --   --  23  ALT  --   --  16 37*  --   --  16  ALKPHOS  --   --  69 59  --   --  68  BILITOT  --   --  0.26 0.5  --   --  0.33  < > = values in this interval not displayed.  Pathology report: Colon, segmental resection for tumor, right -  MULTIFOCAL INVASIVE ADENOCARCINOMA, SPANNING 8.5 CM AND 4.0 CM. - ADENOCARCINOMA INVOLVES THE SEROSA. - LYMPHOVASCULAR INVASION IS IDENTIFIED. 1 of 4 FINAL for Kliethermes, Zhoe C ([id number]) Diagnosis(continued) - PERINEURAL INVASION IS IDENTIFIED. - METASTATIC ADENOCARCINOMA IN 2 OF 16 LYMPH NODES (2/16) WITH EXTRACAPSULAR EXTENSION. - THE SURGICAL RESECTION MARGINS ARE NEGATIVE FOR ADENOCARCINOMA. - SEE ONCOLOGY TABLE BELOW. Microscopic Comment COLON AND RECTUM (INCLUDING TRANS-ANAL RESECTION): Specimen: Terminal ileum and right colon Procedure: Resection Tumor site: Ileocecal valve (tumor #1) and proximal mid ascending colon (tumor #2) Specimen integrity: Intact Macroscopic intactness of mesorectum: N/A     Macroscopic tumor perforation: Not identified Invasive tumor: Tumor #1 (ileocecal valve) Maximum size: 8.5 cm (gross measurement) Histologic type(s): Adenocarcinoma Histologic grade and  differentiation: G2: moderately differentiated/low grade Type of polyp in which invasive carcinoma arose: Likely tubular adenoma Microscopic extension of invasive tumor: Adenocarcinoma involves the serosa Invasive tumor: Tumor #2 (proximal mid ascending colon) Maximum size: 4.0 cm (gross measurement) Histologic type(s): Adenocarcinoma Histologic grade and differentiation: G1: well differentiated/low grade Type of polyp in which invasive carcinoma arose: Tubulovillous adenoma Microscopic extension of invasive tumor: Adenocarcinoma extends into the muscularis propria Lymph-Vascular invasion: Present Peri-neural invasion: Present Tumor deposit(s) (discontinuous extramural extension): Not identified Resection margins: Proximal margin: 32.0 cm Distal margin: 7.5 cm Circumferential (radial) (posterior ascending, posterior descending; lateral and posterior mid-rectum; and entire lower 1/3 rectum): 3.0 cm Treatment effect (neo-adjuvant therapy): N/A Additional polyp(s): Not identified Non-neoplastic findings: No significant findings Lymph nodes: number examined 16; number positive: 2 Pathologic Staging: mT4, N1b Ancillary studies: MSI testing will be performed on the larger higher grade tumor and the results reported separately. Additional studies can be performed upon clinician request. Comments: Grossly, there are two tumors identified. The larger tumor is located at the ileocecal valve and consists of an 8.5 cm span of moderately differentiated adenocarcinoma that involves the serosa. This tumor has associated perineural and lymphovascular invasion. The second discontiguous tumor is present in the proximal right colon and spans 4.0 cm and consists of well differentiated adenocarcinoma that is morphologically dissimilar from the larger tumor. This tumor extends into the muscularis propria. Enid Cutter MD Pathologist, Electronic Signature (Case signed 06/06/2014) Specimen Gross and Clinical  Information   RADIOGRAPHIC STUDIES: I have personally reviewed the radiological images as listed and agreed with the findings in the report. No results found.  ASSESSMENT & PLAN:  81 year old Caucasian female, with multiple medical comorbidities, presented with anemia, abdominal pain and nausea. She underwent exploratory laparotomy, right hemicolectomy, and descending colostomy by Dr. Georgette Dover on 06/02/2014. Surgical path reviewed multifocal (2) colon cancer at  ileocecal valve and ascending colon.  1. Multifocal invasive colon adenocarcinoma, mT4N1Mx, at least stage IIIB, grade 1, MMR normal. -I reviewed her surgical findings and her CT of abdomen and pelvis with patient and her extended family members in great detail today. Both of her colon cancer wall completely surgically resected, however she does have high risk of cancer recurrence in the future due to locally advanced stage.  -CT chest showed no evidence of metastasis. -We again discussed the role of adjuvant chemotherapy. Given her advanced age, multiple comorbidities, limited performance status, prolonged wound healing issue, I do not strongly recommend adjuvant chemotherapy, even chemotherapy alone. She has also made up her mind not to take chemotherapy. -We discussed surveillance of her colon cancer, for total 5 years. I'll see her with lab test including CEA every 3-4 months for the first 2 years, then every 6-12 months for additional 3 years. She  will need a repeat colonoscopy and repeat CT scan in one year. -She is can have colostomy reversed surgery in [place]. -She is clinically doing well, exam is unremarkable. CEA from today still pending. -Continue surveillance.  2. Genetics -Due to her family history of colon cancer, and her daughters multifocal polyps, I recommend her to have a genetic counseling. Her daughter met our Dietitian before. I'll set up the referral.  3. She will also follow-up with her surgeon for her wound  healing issue.   4. Follow-up with her primary care physician for other medical issues.  Plan return to clinic in 3 month with labs.   All questions were answered. The patient knows to call the clinic with any problems, questions or concerns. I spent 20 minutes counseling the patient face to face. The total time spent in the appointment was 25 minutes and more than 50% was on counseling.     Truitt Merle, MD 10/25/2014 11:00 AM

## 2014-10-25 NOTE — Telephone Encounter (Signed)
Pt confirmed labs/ov per 04/12 POF, gave pt AVS and Calendar.... KJ  °

## 2014-10-27 ENCOUNTER — Other Ambulatory Visit: Payer: Self-pay | Admitting: *Deleted

## 2014-10-28 ENCOUNTER — Other Ambulatory Visit: Payer: Self-pay | Admitting: *Deleted

## 2014-11-07 ENCOUNTER — Telehealth: Payer: Self-pay | Admitting: *Deleted

## 2014-11-07 NOTE — Telephone Encounter (Signed)
Spoke with pt and informed pt re:  CEA level done 10/25/14  Was  1.5  Normal as per md.

## 2014-11-15 ENCOUNTER — Encounter: Payer: Self-pay | Admitting: Internal Medicine

## 2014-11-15 ENCOUNTER — Ambulatory Visit (INDEPENDENT_AMBULATORY_CARE_PROVIDER_SITE_OTHER): Payer: Commercial Managed Care - HMO | Admitting: Internal Medicine

## 2014-11-15 VITALS — BP 136/80 | HR 55 | Temp 97.7°F | Resp 20 | Ht 63.0 in | Wt 171.0 lb

## 2014-11-15 DIAGNOSIS — M150 Primary generalized (osteo)arthritis: Secondary | ICD-10-CM | POA: Diagnosis not present

## 2014-11-15 DIAGNOSIS — C189 Malignant neoplasm of colon, unspecified: Secondary | ICD-10-CM

## 2014-11-15 DIAGNOSIS — E7800 Pure hypercholesterolemia, unspecified: Secondary | ICD-10-CM

## 2014-11-15 DIAGNOSIS — I10 Essential (primary) hypertension: Secondary | ICD-10-CM | POA: Diagnosis not present

## 2014-11-15 DIAGNOSIS — M8949 Other hypertrophic osteoarthropathy, multiple sites: Secondary | ICD-10-CM

## 2014-11-15 DIAGNOSIS — M159 Polyosteoarthritis, unspecified: Secondary | ICD-10-CM

## 2014-11-15 MED ORDER — LORAZEPAM 0.5 MG PO TABS: 0.5000 mg | ORAL_TABLET | Freq: Two times a day (BID) | ORAL | Status: DC | PRN

## 2014-11-15 MED ORDER — HYDROCODONE-ACETAMINOPHEN 5-325 MG PO TABS: 1.0000 | ORAL_TABLET | Freq: Four times a day (QID) | ORAL | Status: DC | PRN

## 2014-11-15 MED ORDER — TRAMADOL HCL 50 MG PO TABS: 50.0000 mg | ORAL_TABLET | Freq: Three times a day (TID) | ORAL | Status: DC

## 2014-11-15 NOTE — Progress Notes (Signed)
Subjective:    Patient ID: [name], female    DOB: 10-27-1929, 81 y.o.   MRN: [id number]  HPI  81 year old patient who is seen today for follow-up.  She has essential hypertension and dyslipidemia, as well as osteoarthritis.  She has been seen in follow-up by oncology for colon carcinoma.  She is scheduled for colostomy reversal later this month.  Clinically she is doing quite well except for some osteoarthritic complaints.  She is having pain in the shoulders and left leg and occasional some left lower quadrant abdominal pain.  No nausea or vomiting.  Wt Readings from Last 3 Encounters:  11/15/14 171 lb (77.565 kg)  10/25/14 167 lb 1.6 oz (75.796 kg)  10/13/14 167 lb 12.8 oz (76.114 kg)    Past Medical History  Diagnosis Date  . PONV (postoperative nausea and vomiting)     Pt had problems with memory after procedure  . Headache(784.0)   . CORONARY ARTERY DISEASE     BMS x2 to RCA 10/2007  . Hypertension   . Diastolic CHF, chronic     grade I  . History of TIA (transient ischemic attack)   . Hyperlipidemia   . Depression   . COPD with chronic bronchitis   . GERD (gastroesophageal reflux disease)   . History of hiatal hernia   . History of DVT of lower extremity   . Sigmoid diverticulosis   . Colovaginal fistula     chronic  . History of adenomatous polyp of colon   . Renal insufficiency   . OA (osteoarthritis)   . DJD (degenerative joint disease)   . Wears glasses   . Wears dentures   . Colostomy in place   . S/p bare metal coronary artery stent     11-04-2007  to RCA  . Peripheral neuropathy   . Adenocarcinoma, colon Oncologist-- dr Truitt Merle ([facility])    dx Multifocal (2) Colon cancer @ ileocecal valve and ascending ( [id number]), Stage IIIB, grade I, MMR normal , 2 of 16 +lymph nodes, negative surgical margins---  06-02-2014  Right hemicolectomy w/ colostomy  . Chronic anemia     History   Social History  . Marital Status: Married    Spouse Name: N/A    . Number of Children: N/A  . Years of Education: N/A   Occupational History  . Not on file.   Social History Main Topics  . Smoking status: Former Smoker -- 0.50 packs/day for 15 years    Types: Cigarettes    Quit date: 07/16/1967  . Smokeless tobacco: Never Used  . Alcohol Use: No  . Drug Use: No  . Sexual Activity: Not on file   Other Topics Concern  . Not on file   Social History Narrative   Married, 2 children.  As of November 2015 her husband has been living in a nursing home for tendon after years as he suffers from multiple medical problems.   Patient does not drink alcohol nor does she smoke or chew tobacco products   Right handed   8th grade   1 cup daily    Past Surgical History  Procedure Laterality Date  . Appendectomy    . Cholecystectomy    . Abdominal hysterectomy    . Lumbar laminectomy  10-29-2002    Left  L3 -- 4  decompression  . Total knee arthroplasty Bilateral left 1994/  right 2000  . Rotator cuff repair Bilateral   . Cataract extraction w/  intraocular lens  implant, bilateral Bilateral   . Tonsillectomy    . Dilation and curettage of uterus    . Patellectomy Right 09/14/2013    Procedure: PATELLECTOMY;  Surgeon: Meredith Pel, MD;  Location: [place];  Service: Orthopedics;  Laterality: Right;  . Partial colectomy N/A 06/02/2014    Procedure: RIGHT PARTIAL COLECTOMY ;  Surgeon: Donnie Mesa, MD;  Location: [place];  Service: General;  Laterality: N/A;  . Colostomy N/A 06/02/2014    Procedure: DIVERTING DESCENDING END COLOSTOMY;  Surgeon: Donnie Mesa, MD;  Location: [place];  Service: General;  Laterality: N/A;  . Coronary angioplasty with stent placement  11-04-2007  dr bensimhon    BMS x2 to RCA/  mild Non-obstructive disease LAD/  normal LVF  . Revision total knee arthroplasty Bilateral right  04-02-2011/  left 1996 & 10-05-1999  . Anterior cervical decomp/discectomy fusion  01-31-2009    C5 -- 7  . Cardiovascular stress test  02-26-2011     normal lexiscan no exercise study/  no ischemia/  normal LV function and wall motion, ef 67%  . Transthoracic echocardiogram  12-01-2009    grade I diastolic dysfunction/  ef 60%/  moderate MR/  mild TR  . Evaluation under anesthesia with anal fistulectomy N/A 10/13/2014    Procedure: ANAL EXAM UNDER ANESTHESIA ;  Surgeon: [name], MD;  Location: [facility];  Service: General;  Laterality: N/A;  . Proctoscopy N/A 10/13/2014    Procedure: RIDGE PROCTOSCOPY;  Surgeon: [name], MD;  Location: [facility];  Service: General;  Laterality: N/A;    Family History  Problem Relation Age of Onset  . Osteoarthritis Mother   . Heart failure Mother   . Cancer Sister 54    ? colon cancer   . Cancer Brother 30    kidney cancer   . Cancer Maternal Uncle 70    colon cancer     Allergies  Allergen Reactions  . Potassium-Containing Compounds Swelling    Allergic to Intravenous infusion.    Current Outpatient Prescriptions on File Prior to Visit  Medication Sig Dispense Refill  . acetaminophen (TYLENOL) 325 MG tablet Take 2 tablets (650 mg total) by mouth every 6 (six) hours as needed for mild pain (or Fever >/= 101).    . Ascorbic Acid (VITAMIN C) 500 MG CAPS Take 1 tablet by mouth daily.     . benazepril (LOTENSIN) 40 MG tablet Take 1 tablet by mouth daily. On hold until next follow up    . cholecalciferol (VITAMIN D) 1000 UNITS tablet Take 1,000 Units by mouth daily.    . ferrous sulfate 325 (65 FE) MG tablet Take 1 tablet (325 mg total) by mouth 2 (two) times daily with a meal. (Patient taking differently: Take 325 mg by mouth daily with breakfast. )  3  . gabapentin (NEURONTIN) 600 MG tablet Take 600 mg by mouth daily.    . hydroxypropyl methylcellulose / hypromellose (ISOPTO TEARS / GONIOVISC) 2.5 % ophthalmic solution Place 1 drop into both eyes as needed for dry eyes.    . metoprolol (LOPRESSOR) 50 MG tablet Take 50 mg by mouth 2 (two) times daily.    . Multiple Vitamins-Minerals  (MULTIVITAMIN WITH MINERALS) tablet Take 1 tablet by mouth daily.     . nitroGLYCERIN (NITROSTAT) 0.4 MG SL tablet Place 0.4 mg under the tongue every 5 (five) minutes as needed for chest pain.     . promethazine (PHENERGAN) 12.5 MG tablet Take 1 tablet by  mouth every 6 (six) hours as needed. Nausea/vomitting    . sertraline (ZOLOFT) 50 MG tablet TAKE 1 TABLET EVERY DAY 90 tablet 1  . simvastatin (ZOCOR) 40 MG tablet Take 1 tablet (40 mg total) by mouth at bedtime. (Patient not taking: Reported on 10/25/2014) 90 tablet 3  . traZODone (DESYREL) 50 MG tablet Take 50 mg by mouth at bedtime.     . triamcinolone cream (KENALOG) 0.1 % Apply 1 application topically 2 (two) times daily as needed (Eczema on legs).     No current facility-administered medications on file prior to visit.    BP 136/80 mmHg  Pulse 55  Temp(Src) 97.7 F (36.5 C) (Oral)  Resp 20  Ht 5' 3" (1.6 m)  Wt 171 lb (77.565 kg)  BMI 30.30 kg/m2  SpO2 98%     Review of Systems  Constitutional: Negative.   HENT: Negative for congestion, dental problem, hearing loss, rhinorrhea, sinus pressure, sore throat and tinnitus.   Eyes: Negative for pain, discharge and visual disturbance.  Respiratory: Negative for cough and shortness of breath.   Cardiovascular: Negative for chest pain, palpitations and leg swelling.  Gastrointestinal: Positive for abdominal pain. Negative for nausea, vomiting, diarrhea, constipation, blood in stool and abdominal distention.  Genitourinary: Negative for dysuria, urgency, frequency, hematuria, flank pain, vaginal bleeding, vaginal discharge, difficulty urinating, vaginal pain and pelvic pain.  Musculoskeletal: Positive for back pain, arthralgias and neck stiffness. Negative for joint swelling and gait problem.  Skin: Negative for rash.  Neurological: Negative for dizziness, syncope, speech difficulty, weakness, numbness and headaches.  Hematological: Negative for adenopathy.  Psychiatric/Behavioral:  Negative for behavioral problems, dysphoric mood and agitation. The patient is not nervous/anxious.        Objective:   Physical Exam  Constitutional: She is oriented to person, place, and time. She appears well-developed and well-nourished.  Weight 171, blood pressure 136/80   HENT:  Head: Normocephalic.  Right Ear: External ear normal.  Left Ear: External ear normal.  Mouth/Throat: Oropharynx is clear and moist.  Eyes: Conjunctivae and EOM are normal. Pupils are equal, round, and reactive to light.  Neck: Normal range of motion. Neck supple. No thyromegaly present.  Cardiovascular: Normal rate, regular rhythm, normal heart sounds and intact distal pulses.   Pulmonary/Chest: Effort normal and breath sounds normal.  Abdominal: Soft. Bowel sounds are normal. She exhibits no mass. There is no tenderness.  Colostomy  Musculoskeletal: Normal range of motion. She exhibits no edema.  Lymphadenopathy:    She has no cervical adenopathy.  Neurological: She is alert and oriented to person, place, and time.  Skin: Skin is warm and dry. No rash noted.  Psychiatric: She has a normal mood and affect. Her behavior is normal.          Assessment & Plan:    Essential hypertension Status post resection for colon cancer Status post colostomy.  4.  Colostomy reversal later this month Coronary artery disease, stable History diastolic heart failure, compensated  No change in therapy Low-salt diet recommended Medications refilled Recheck 6 months or as needed

## 2014-11-15 NOTE — Patient Instructions (Signed)
Limit your sodium (Salt) intake  Return in 6 months for follow-up  

## 2014-11-15 NOTE — Progress Notes (Signed)
Pre visit review using our clinic review tool, if applicable. No additional management support is needed unless otherwise documented below in the visit note. 

## 2014-11-15 NOTE — Pre-Procedure Instructions (Signed)
Karma Ansley Suder  11/15/2014   Your procedure is scheduled on:  Thursday, [place] 12.  Report to [facility] Admitting at 5:30AM.   Call this number if you have problems the morning of surgery: [phone]                For any other questions, please call [phone], Monday - Friday 8 AM - 4 PM.   Remember:   Do not eat food or drink liquids after midnight Wednesday, [place] 11.   Take these medicines the morning of surgery with A SIP OF WATER: gabapentin (NEURONTIN),  metoprolol (LOPRESSOR),  sertraline (ZOLOFT).              Take if needed: Nitroglycerin, Pain medication, Medication for nausea, LORazepam (ATIVAN).               Stop taking Aspirin, Coumadin, Plavix, Effient and Herbal medications.  Do not take any NSAIDs ie: Ibuprofen,  Advil,Naproxen or any medication containing Aspirin.    Do not wear jewelry, make-up or nail polish.  Do not wear lotions, powders, or perfumes.   Do not shave 48 hours prior to surgery.   Do not bring valuables to the hospital.              [facility] is not responsible for any belongings or valuables.               Contacts, dentures or bridgework Kienle not be worn into surgery.  Leave suitcase in the car. After surgery it Swetz be brought to your room.  For patients admitted to the hospital, discharge time is determined by your treatment team.                Special Instructions: Review  [facility] - Preparing For Surgery.   Please read over the following fact sheets that you were given: Pain Booklet, Coughing and Deep Breathing and Surgical Site Infection Prevention

## 2014-11-16 ENCOUNTER — Other Ambulatory Visit: Payer: Self-pay

## 2014-11-16 ENCOUNTER — Encounter (HOSPITAL_COMMUNITY)
Admission: RE | Admit: 2014-11-16 | Discharge: 2014-11-16 | Disposition: A | Discharge status: Home / Self Care | Payer: Commercial Managed Care - HMO | Source: Ambulatory Visit | Attending: Surgery | Admitting: Surgery

## 2014-11-16 ENCOUNTER — Encounter (HOSPITAL_COMMUNITY): Payer: Self-pay

## 2014-11-16 ENCOUNTER — Telehealth: Payer: Self-pay

## 2014-11-16 DIAGNOSIS — I2510 Atherosclerotic heart disease of native coronary artery without angina pectoris: Secondary | ICD-10-CM | POA: Insufficient documentation

## 2014-11-16 DIAGNOSIS — Z01818 Encounter for other preprocedural examination: Secondary | ICD-10-CM

## 2014-11-16 DIAGNOSIS — Z01812 Encounter for preprocedural laboratory examination: Secondary | ICD-10-CM | POA: Insufficient documentation

## 2014-11-16 DIAGNOSIS — I10 Essential (primary) hypertension: Secondary | ICD-10-CM | POA: Diagnosis not present

## 2014-11-16 HISTORY — DX: Personal history of other medical treatment: Z92.89

## 2014-11-16 LAB — BASIC METABOLIC PANEL
Anion gap: 7 (ref 5–15)
BUN: 47 mg/dL — ABNORMAL HIGH (ref 6–20)
CO2: 25 mmol/L (ref 22–32)
Calcium: 9.4 mg/dL (ref 8.9–10.3)
Chloride: 106 mmol/L (ref 101–111)
Creatinine, Ser: 1.36 mg/dL — ABNORMAL HIGH (ref 0.44–1.00)
GFR calc Af Amer: 41 mL/min — ABNORMAL LOW (ref >60)
GFR calc non Af Amer: 35 mL/min — ABNORMAL LOW (ref >60)
Glucose, Bld: 101 mg/dL — ABNORMAL HIGH (ref 70–99)
Potassium: 5.2 mmol/L — ABNORMAL HIGH (ref 3.5–5.1)
Sodium: 138 mmol/L (ref 135–145)

## 2014-11-16 LAB — CBC
HCT: 35.6 % — ABNORMAL LOW (ref 36.0–46.0)
Hemoglobin: 11.3 g/dL — ABNORMAL LOW (ref 12.0–15.0)
MCH: 28.7 pg (ref 26.0–34.0)
MCHC: 31.7 g/dL (ref 30.0–36.0)
MCV: 90.4 fL (ref 78.0–100.0)
Platelets: 139 10*3/uL — ABNORMAL LOW (ref 150–400)
RBC: 3.94 MIL/uL (ref 3.87–5.11)
RDW: 13.9 % (ref 11.5–15.5)
WBC: 6.9 10*3/uL (ref 4.0–10.5)

## 2014-11-16 LAB — TYPE AND SCREEN
ABO/RH(D): A POS
Antibody Screen: NEGATIVE

## 2014-11-16 NOTE — Telephone Encounter (Signed)
Pt seen by PA Ebony Hail and was told by Dr. Georgette Dover to call PCP office for referral to cardiology. Pt has not been seen by cardiology since 2012. Pt has surgery scheduled for 11/24/2014 and due to cardiac hx, Dr. Georgette Dover advises that she have see cardiology first.

## 2014-11-16 NOTE — Progress Notes (Signed)
[name] complains of chest pain, last time a few days ago.  States pain last a short period to a few hours. Ask what kind of pain, "I don't know, it just hurts".  [name] states  She experiences shortness of breath, diaphoresis and nausea , not emesis.  Patient states that she does not take any medication for chest pain.

## 2014-11-16 NOTE — Progress Notes (Addendum)
Anesthesia PAT Evaluation:  Patient is an 81 year old female scheduled for sigmoid colectomy and colostomy closure on 11/24/14 by Dr. Georgette Dover.    History includes former smoker, CAD s/p BMS to RCA 01/2535, diastolic CHF, murmur, HTN, hypercholesterolemia, palpitations, dyspnea, GERD, hiatal hernia, iron deficiency anemia (on iron) 2014, diverticulosis, cholecystectomy, tonsillectomy, hysterectomy, appendectomy, bilateral TKA--right TKA '12, right patellectomy '15, right partial colectomy with diverting descending end colostomy for near obstructing mass at ileocecal valve with rectovaginal fistula 06/02/14 (pathology: multifocal invasive adenocarcinoma with serosa and lymphovascular invasion noted), rigid proctoscopy 10/13/14. For anesthesia issues she reported post-operative N/V and post-operative memory problems that resolved in ~ 2-3 days.   PCP is Dr. Bluford Kaufmann, last visit 11/15/14. Cardiologist has been Dr. Angelena Form (Dr. Haroldine Laws prior to that), but she has not been seen since 02/2011 prior to right TKA.  Meds include benazepril, Nexium, 65 Fe, Neurontin, Norco, Lopressor, Nitro, promethazine, Zoloft, tramadol, trazodone.  11/16/14 EKG: SB at 55 bpm with marked sinus arrhythmia, possible anterior infarct (age undetermined).  Appears to have minor anterior T wave abnormality when compared to 05/31/14 EKG.  On 02/26/11 she had a normal stress nuclear study, EF 67%.  Echo on 12/01/09 showed: - Left ventricle: The cavity size was normal. Wall thickness was normal. The estimated ejection fraction was 60%. Wall motion was normal; there were no regional wall motion abnormalities. Doppler parameters are consistent with abnormal left ventricular relaxation (grade 1 diastolic dysfunction). - Mitral valve: Mitral regurgitation is moderate, when the PISA data is considered. - Right ventricle: The cavity size was normal. Systolic function was mildly reduced. - Tricuspid valve: Mild regurgitation. Peak RV-RA  gradient: 35mm Hg (S).  Cardiac cath on 11/02/07 showed: Normal left main. LAD was a long vessel wrapping the apex. It gave off a fairly large diagonal branch in the proximal portion. There was calcium throughout the proximal and mid LAD with diffuse 40% stenosis. The remainder of the LAD was a large vessel with minor luminal irregularities. In the midportion of the large diagonal there was approximately 70-75% focal stenosis. Moderate size left circumflex with 30% proximal stenosis and 50% stenosis in the mid AV groove. RCA with a large dominant vessel giving off RV branch, large PDA and 2 posterior laterals. There is 90% tubular stenosis in the mid section spanning the takeoff of the RV branch. EF 60% with no regional wall motion abnormalities. She subsequently underwent bare-metal stent to mid RCA on 11/04/2007.   1V CXR 08/22/14: The lungs are adequately inflated. There is no focal infiltrate. There is a moderate-sized hiatal hernia -partially intrathoracic stomach with an air-fluid level. The heart and pulmonary vascularity are normal. There is degenerative change of the right shoulder and there is thoracolumbar scoliosis in a reverse S-shaped configuration.   Preoperative labs noted.  K 5.2. BUN 47, up from 32-42 since 08/25/14. Cr 1.36, up from 1.33 10/25/14.  H/H 11.3/35.6. T&S done.  I was asked to evaluate her at PAT due to history of chest pain.  She reports she has had mild left chest pains for at least the past year.  I had a somewhat difficult time getting her to give me specific details regarding her chest pain symptoms. She can only describe them as "hurting" in her left chest with occasionally with radiation to the left shoulder. Sometimes with diaphoresis and nausea. She lives alone and still cleans her house including vacuuming and mopping. Symptoms more noticeable with exertion, but not always brought on by exertion.  She  is not terribly bothered by her symptoms.  No ASA or Nitro.  She does  not feel they have progressed over the past year.  She has chronic mild LE edema.  She denied SOB.  She gets occasional chest palpitations/racing sensation about once a month that does not last more than a few minutes. On exam, heart rhythm was slightly irregular (sinus arrhythmia on EKG), no murmur noted.  Lungs clear.  Mild BLE pre-tibial edema.  No carotid bruits noted.   I informed patient that she Myers need to see cardiology pre-operatively due to her chest pain history with known CAD.  I think she has some typical symptoms of stable angina--although symptoms not always worse with activity. I reviewed symptoms of unstable angina with her and notified her to contact EMS if any developed. Following review of her labs, I reviewed above with anesthesiologist Dr. Tobias Alexander who agreed that with her symptoms and no cardiology follow-up or cardiac testing in > 3 years that pre-operative cardiology evaluation would be recommended. I notified triage nurse Amy at [facility].  She instructed me to contact patient's PCP to arrange cardiology referral. I notified Tamesha at Dr. Truddie Hidden office and that surgery was scheduled for 11/24/14 in hopes that referall could be expedited.  I left a voice message for patient to call me back so I could confirm that anesthesiologist does in fact want her to see a cardiologist pre-operatively.  I will also send a staff message to Dr. Georgette Dover.    [facility] Stay [facility]/Anesthesiology Phone [phone] 11/16/2014 3:05 PM   Addendum: Patient was seen by cardiologist Dr. Domenic Polite on 11/22/14 for CAD follow-up and preoperative evaluation. His note indicates that "She has been symptomatically stable on medical therapy, does not report any angina symptoms or breathlessness beyond NYHA class II with activities exceeding 4 METs based on description... We discussed the situation today, and at this point there is no compelling reason to delay surgery for follow-up stress  testing. She should be able to proceed with overall low to intermediate risk of perioperative cardiac complications. Would recommend continuing baseline cardiac regimen, obtain preoperative and postoperative ECG, consider telemetry for at least 24 hours postoperatively. If the need arises, our cardiology service can see her as an inpatient. Otherwise she can reestablish follow-up with Dr. Angelena Form following discharge."  Myra Gianotti, [facility] Short Stay Center/Anesthesiology Phone [phone] 11/23/2014 9:38 AM

## 2014-11-17 NOTE — Telephone Encounter (Signed)
Please refer to cardiology for preoperative clearance

## 2014-11-17 NOTE — Telephone Encounter (Signed)
Please see message, okay to do referral to Cardiology?

## 2014-11-17 NOTE — Telephone Encounter (Signed)
[name] notified I put urgent referral in for pt to see cardiologist for pre-op clearance.

## 2014-11-21 ENCOUNTER — Encounter: Payer: Self-pay | Admitting: *Deleted

## 2014-11-22 ENCOUNTER — Encounter: Payer: Self-pay | Admitting: Cardiology

## 2014-11-22 ENCOUNTER — Ambulatory Visit (INDEPENDENT_AMBULATORY_CARE_PROVIDER_SITE_OTHER): Payer: Commercial Managed Care - HMO | Admitting: Cardiology

## 2014-11-22 VITALS — BP 126/74 | HR 62 | Ht 63.0 in | Wt 169.8 lb

## 2014-11-22 DIAGNOSIS — Z01810 Encounter for preprocedural cardiovascular examination: Secondary | ICD-10-CM | POA: Diagnosis not present

## 2014-11-22 DIAGNOSIS — I10 Essential (primary) hypertension: Secondary | ICD-10-CM

## 2014-11-22 DIAGNOSIS — E782 Mixed hyperlipidemia: Secondary | ICD-10-CM | POA: Diagnosis not present

## 2014-11-22 DIAGNOSIS — I2510 Atherosclerotic heart disease of native coronary artery without angina pectoris: Secondary | ICD-10-CM

## 2014-11-22 NOTE — Progress Notes (Signed)
Called [place] office pt is to see cardiologist today 11-22-14 for heart clearance.

## 2014-11-22 NOTE — Progress Notes (Signed)
Cardiology Office Note  Date: 11/22/2014   ID: [name], DOB January 25, 1930, MRN [id number]  PCP: Whitney Cowden, MD  Primary Cardiologist: Whitney. Lauree [name]  Chief Complaint  Patient presents with  . Preoperative evaluation  . Coronary Artery Disease    History of Present Illness: [name] is an 81 y.o. female patient of Whitney. Angelena [name] last seen in 2012 based on chart review, now referred to the [facility] office for a preoperative consultation by [name]. She is scheduled for an elective sigmoid colectomy and colostomy closure on [place] 12. This is my first meeting with her, I reviewed the chart in detail including previous cardiac testing which is outlined below.  [name] lives in [place] in her own home, cooks and cleans including sweeping the floors and making her bed. She does use a rolling walker due to chronic knee pain and weakness, but states that she does not let this slow her down. She shops for her own groceries, carries them into the house, walks across the parking lots without having to stop. She reports activities exceeding 4 METs without any angina symptoms or shortness of breath beyond NYHA class II.  Last formal ischemic evaluation was in 2012 as detailed below, normal Myoview at that time. She reports compliance with her medications, cardiac regimen including Lopressor, Zocor, Lotensin, and as needed nitroglycerin (has not required). She does not take aspirin daily due to recurring reflux symptoms.  I reviewed her recent ECG outlined below.  Past Medical History  Diagnosis Date  . CAD (coronary artery disease)     BMS x2 to RCA 10/2007  . Essential hypertension   . Diastolic CHF, chronic   . History of TIA (transient ischemic attack)   . Hyperlipidemia   . Depression   . COPD with chronic bronchitis   . GERD (gastroesophageal reflux disease)   . History of hiatal hernia   . History of DVT of lower extremity   . Sigmoid diverticulosis   .  Colovaginal fistula   . History of adenomatous polyp of colon   . Renal insufficiency   . OA (osteoarthritis)   . DJD (degenerative joint disease)   . Wears glasses   . Wears dentures   . Colostomy in place   . Peripheral neuropathy   . Adenocarcinoma, colon Oncologist-- [name]    Multifocal (2) Colon cancer @ ileocecal valve and ascending ( [id number]), Stage IIIB, grade I, MMR normal , 2 of 16 +lymph nodes, negative surgical margins---  06-02-2014  Right hemicolectomy w/ colostomy  . Chronic anemia   . History of blood transfusion     Past Surgical History  Procedure Laterality Date  . Appendectomy    . Cholecystectomy    . Abdominal hysterectomy    . Lumbar laminectomy  10-29-2002,  1969    Left  L3 -- 4  decompression  . Total knee arthroplasty Bilateral left 1994/  right 2000  . Rotator cuff repair Bilateral   . Cataract extraction w/ intraocular lens  implant, bilateral Bilateral   . Tonsillectomy    . Dilation and curettage of uterus    . Patellectomy Right 09/14/2013    Procedure: PATELLECTOMY;  Surgeon: Whitney Pel, MD;  Location: [facility];  Service: Orthopedics;  Laterality: Right;  . Partial colectomy N/A 06/02/2014    Procedure: RIGHT PARTIAL COLECTOMY ;  Surgeon: Whitney Mesa, MD;  Location: [facility];  Service: General;  Laterality: N/A;  . Colostomy N/A 06/02/2014  Procedure: DIVERTING DESCENDING END COLOSTOMY;  Surgeon: Whitney Mesa, MD;  Location: [facility];  Service: General;  Laterality: N/A;  . Revision total knee arthroplasty Bilateral right  04-02-2011/  left 1996 & 10-05-1999  . Anterior cervical decomp/discectomy fusion  01-31-2009    C5 -- 7  . Cardiovascular stress test  02-26-2011    Normal lexiscan no exercise study/  no ischemia/  normal LV function and wall motion, ef 67%  . Transthoracic echocardiogram  12-01-2009    Grade I diastolic dysfunction/  ef 60%/  moderate MR/  mild TR  . Evaluation under anesthesia with anal fistulectomy N/A 10/13/2014      Procedure: ANAL EXAM UNDER ANESTHESIA ;  Surgeon: Whitney Ruff, MD;  Location: [facility];  Service: General;  Laterality: N/A;  . Proctoscopy N/A 10/13/2014    Procedure: RIDGE PROCTOSCOPY;  Surgeon: Whitney Ruff, MD;  Location: [facility];  Service: General;  Laterality: N/A;  . Coronary angioplasty with stent placement  11-04-2007  Whitney [name]    BMS x2 to RCA/  mild Non-obstructive disease LAD/  normal LVF  . Bone spurs Bilateral     Feet  . Hand surgery      Tendon repair  . Carpal tunnel release Right     Current Outpatient Prescriptions  Medication Sig Dispense Refill  . acetaminophen (TYLENOL) 325 MG tablet Take 2 tablets (650 mg total) by mouth every 6 (six) hours as needed for mild pain (or Fever >/= 101).    . Ascorbic Acid (VITAMIN C) 500 MG CAPS Take 1 tablet by mouth daily.     . benazepril (LOTENSIN) 40 MG tablet Take 1 tablet by mouth daily. Takes 2 times a week    . cholecalciferol (VITAMIN D) 1000 UNITS tablet Take 1,000 Units by mouth daily.    Marland Kitchen esomeprazole (NEXIUM) 20 MG capsule Take 20 mg by mouth daily at 12 noon. otc    . ferrous sulfate 325 (65 FE) MG tablet Take 1 tablet (325 mg total) by mouth 2 (two) times daily with a meal. (Patient taking differently: Take 325 mg by mouth daily with breakfast. )  3  . gabapentin (NEURONTIN) 600 MG tablet Take 600 mg by mouth daily.    Marland Kitchen HYDROcodone-acetaminophen (NORCO) 5-325 MG per tablet Take 1-2 tablets by mouth every 6 (six) hours as needed for moderate pain. 120 tablet 0  . hydroxypropyl methylcellulose / hypromellose (ISOPTO TEARS / GONIOVISC) 2.5 % ophthalmic solution Place 1 drop into both eyes as needed for dry eyes.    Marland Kitchen LORazepam (ATIVAN) 0.5 MG tablet Take 1 tablet (0.5 mg total) by mouth 2 (two) times daily as needed for anxiety or sleep. 60 tablet 5  . metoprolol (LOPRESSOR) 50 MG tablet Take 50 mg by mouth 2 (two) times daily.    . Multiple Vitamins-Minerals (MULTIVITAMIN WITH MINERALS) tablet Take 1 tablet by  mouth daily.     . nitroGLYCERIN (NITROSTAT) 0.4 MG SL tablet Place 0.4 mg under the tongue every 5 (five) minutes as needed for chest pain.     . promethazine (PHENERGAN) 12.5 MG tablet Take 1 tablet by mouth every 6 (six) hours as needed. Nausea/vomitting    . sertraline (ZOLOFT) 50 MG tablet TAKE 1 TABLET EVERY DAY 90 tablet 1  . simvastatin (ZOCOR) 40 MG tablet Take 1 tablet (40 mg total) by mouth at bedtime. 90 tablet 3  . traMADol (ULTRAM) 50 MG tablet Take 1 tablet (50 mg total) by mouth every 8 (eight) hours. [place]  tablet 5  . traZODone (DESYREL) 50 MG tablet Take 50 mg by mouth at bedtime.     . triamcinolone cream (KENALOG) 0.1 % Apply 1 application topically 2 (two) times daily as needed (Eczema on legs).     No current facility-administered medications for this visit.    Allergies:  Potassium-containing compounds   Social History: The patient  reports that she quit smoking about 47 years ago. Her smoking use included Cigarettes. She has a 7.5 pack-year smoking history. She has never used smokeless tobacco. She reports that she does not drink alcohol or use illicit drugs.   Family History: The patient's family history includes Cancer (age of onset: 49) in her brother; Cancer (age of onset: 65) in her sister; Cancer (age of onset: 71) in her maternal uncle; Heart failure in her mother; Osteoarthritis in her mother.   ROS:  Please see the history of present illness. Otherwise, complete review of systems is positive for the weakness. She does not endorse any falls, no palpitations or syncope.  All other systems are reviewed and negative.   Physical Exam: VS:  BP 126/74 mmHg  Pulse 62  Ht $R'5\' 3"'YB$  (1.6 m)  Wt 169 lb 12.8 oz (77.021 kg)  BMI 30.09 kg/m2  SpO2 99%, BMI Body mass index is 30.09 kg/(m^2).  Wt Readings from Last 3 Encounters:  11/22/14 169 lb 12.8 oz (77.021 kg)  11/15/14 171 lb (77.565 kg)  10/25/14 167 lb 1.6 oz (75.796 kg)     General: Patient appears comfortable at  rest. HEENT: Conjunctiva and lids normal, oropharynx clear. Neck: Supple, no elevated JVP or carotid bruits, no thyromegaly. Lungs: Clear to auscultation, nonlabored breathing at rest. Cardiac: Regular rate and rhythm, no S3, soft basal systolic murmur, no pericardial rub. Abdomen: Soft, nontender, bowel sounds present, has colostomy. Extremities: No pitting edema, distal pulses 2+. Skin: Warm and dry. Musculoskeletal: No kyphosis. Neuropsychiatric: Alert and oriented x3, affect grossly appropriate.   ECG:  Recent tracing from 11/16/2014 reviewed showing sinus bradycardia with PACs , poor R wave progression anteriorly - somewhat more prominent than previous tracings but present in the past, nonspecific T-wave changes.   Recent Labwork: 12/27/2013: Pro B Natriuretic peptide (BNP) 1267.0* 05/31/2014: TSH 4.160 10/25/2014: ALT 16; AST 23 11/16/2014: BUN 47*; Creatinine 1.36*; Hemoglobin 11.3*; Platelets 139*; Potassium 5.2*; Sodium 138     Component Value Date/Time   CHOL 130 01/19/2013 1014   TRIG 87.0 01/19/2013 1014   HDL 33.60* 01/19/2013 1014   CHOLHDL 4 01/19/2013 1014   VLDL 17.4 01/19/2013 1014   LDLCALC 79 01/19/2013 1014    Other Studies Reviewed Today:  Lexiscan Myoview 02/26/2011: QPS Raw Data Images: Normal; no motion artifact; normal heart/lung ratio. Stress Images: Normal homogeneous uptake in all areas of the myocardium. Rest Images: Normal homogeneous uptake in all areas of the myocardium. Subtraction (SDS): Normal Transient Ischemic Dilatation (Normal <1.22): 1.04 Lung/Heart Ratio (Normal <0.45): .33  Quantitative Gated Spect Images QGS EDV: 93 ml QGS ESV: 31 ml QGS cine images: NL LV Function; NL Wall Motion QGS EF: 67%  Impression Exercise Capacity: Lexiscan with no exercise. BP Response: Normal blood pressure response. Clinical Symptoms: No chest pain. ECG Impression: No significant ST segment change suggestive of ischemia. Comparison with  Prior Nuclear Study: No previous nuclear study performed  Overall Impression: Normal stress nuclear study.   Assessment and Plan:  1. Preoperative evaluation in an 81 year old woman with known CAD status post BMS x 2 to the RCA in 2009  and normal LV systolic function. She has been symptomatically stable on medical therapy, does not report any angina symptoms or breathlessness beyond NYHA class II with activities exceeding 4 METs based on description. Last functional assessment was 4 years ago with normal Myoview. I reviewed her recent ECG showing overall nonspecific abnormalities with poor R-wave progression. Current medications include ACE inhibitor, statin and beta blocker. She is scheduled to undergo elective sigmoid colectomy and colostomy closure in 2 days with [name] at [facility]. We discussed the situation today, and at this point there is no compelling reason to delay surgery for follow-up stress testing. She should be able to proceed with overall low to intermediate risk of perioperative cardiac complications. Would recommend continuing baseline cardiac regimen, obtain preoperative and postoperative ECG, consider telemetry for at least 24 hours postoperatively. If the need arises, our cardiology service can see her as an inpatient. Otherwise she can reestablish follow-up with Whitney. Angelena [name] following discharge.  2. Essential hypertension, blood pressure control today.  3. History of hyperlipidemia, on statin therapy, followed by Whitney. Burnice Logan.  4. Reported history of chronic diastolic heart failure. Weight has been stable, no clear evidence of volume overload.   Current medicines were reviewed with the patient today.   Disposition: FU with Whitney. Angelena [name] post discharge.    Signed, Satira Sark, MD, [facility] 11/22/2014 3:46 PM    [facility] [place], [place], [place] [address] Phone: [phone]; Fax: [phone]

## 2014-11-22 NOTE — Patient Instructions (Signed)
Your physician recommends that you schedule a follow-up appointment AS NEEDED  Your physician recommends that you continue on your current medications as directed. Please refer to the Current Medication list given to you today.  WE WILL FORWARD THIS VISIT TO DR. Georgette Dover   Thank you for choosing [facility]!!

## 2014-11-23 MED ORDER — DEXTROSE 5 % IV SOLN
2.0000 g | INTRAVENOUS | Status: AC
  Administered 2014-11-24: 2 g via INTRAVENOUS
  Filled 2014-11-23 (×2): qty 2

## 2014-11-23 MED ORDER — CHLORHEXIDINE GLUCONATE 4 % EX LIQD
1.0000 "application " | Freq: Once | CUTANEOUS | Status: DC
  Filled 2014-11-23: qty 15

## 2014-11-24 ENCOUNTER — Encounter (HOSPITAL_COMMUNITY): Payer: Self-pay | Admitting: *Deleted

## 2014-11-24 ENCOUNTER — Encounter (HOSPITAL_COMMUNITY)
Admission: RE | Disposition: A | Discharge status: Skilled Nursing Facility | Payer: Self-pay | Source: Ambulatory Visit | Attending: Surgery

## 2014-11-24 ENCOUNTER — Inpatient Hospital Stay (HOSPITAL_COMMUNITY)
Admission: RE | Admit: 2014-11-24 | Discharge: 2014-11-30 | DRG: 330 | Disposition: A | Discharge status: Skilled Nursing Facility | Payer: Commercial Managed Care - HMO | Source: Ambulatory Visit | Attending: Surgery | Admitting: Surgery

## 2014-11-24 ENCOUNTER — Inpatient Hospital Stay (HOSPITAL_COMMUNITY): Payer: Commercial Managed Care - HMO | Admitting: Anesthesiology

## 2014-11-24 ENCOUNTER — Inpatient Hospital Stay (HOSPITAL_COMMUNITY): Payer: Commercial Managed Care - HMO | Admitting: Vascular Surgery

## 2014-11-24 DIAGNOSIS — K5732 Diverticulitis of large intestine without perforation or abscess without bleeding: Principal | ICD-10-CM | POA: Diagnosis present

## 2014-11-24 DIAGNOSIS — M419 Scoliosis, unspecified: Secondary | ICD-10-CM | POA: Diagnosis present

## 2014-11-24 DIAGNOSIS — K449 Diaphragmatic hernia without obstruction or gangrene: Secondary | ICD-10-CM | POA: Diagnosis present

## 2014-11-24 DIAGNOSIS — C772 Secondary and unspecified malignant neoplasm of intra-abdominal lymph nodes: Secondary | ICD-10-CM | POA: Diagnosis present

## 2014-11-24 DIAGNOSIS — C19 Malignant neoplasm of rectosigmoid junction: Secondary | ICD-10-CM | POA: Diagnosis present

## 2014-11-24 DIAGNOSIS — K660 Peritoneal adhesions (postprocedural) (postinfection): Secondary | ICD-10-CM | POA: Diagnosis present

## 2014-11-24 HISTORY — PX: LAPAROSCOPIC SIGMOID COLECTOMY: SHX5928

## 2014-11-24 LAB — CREATININE, SERUM
Creatinine, Ser: 1.16 mg/dL — ABNORMAL HIGH (ref 0.44–1.00)
GFR calc Af Amer: 49 mL/min — ABNORMAL LOW (ref >60)
GFR calc non Af Amer: 43 mL/min — ABNORMAL LOW (ref >60)

## 2014-11-24 LAB — CBC
HCT: 43.7 % (ref 36.0–46.0)
Hemoglobin: 14.4 g/dL (ref 12.0–15.0)
MCH: 29.2 pg (ref 26.0–34.0)
MCHC: 33 g/dL (ref 30.0–36.0)
MCV: 88.6 fL (ref 78.0–100.0)
Platelets: 139 10*3/uL — ABNORMAL LOW (ref 150–400)
RBC: 4.93 MIL/uL (ref 3.87–5.11)
RDW: 13.4 % (ref 11.5–15.5)
WBC: 12.9 10*3/uL — ABNORMAL HIGH (ref 4.0–10.5)

## 2014-11-24 SURGERY — COLECTOMY, SIGMOID, LAPAROSCOPIC
Anesthesia: General | Site: Abdomen

## 2014-11-24 MED ORDER — HYDROMORPHONE HCL 1 MG/ML IJ SOLN
0.2500 mg | INTRAMUSCULAR | Status: DC | PRN
  Administered 2014-11-24 (×4): 0.5 mg via INTRAVENOUS

## 2014-11-24 MED ORDER — EPHEDRINE SULFATE 50 MG/ML IJ SOLN
INTRAMUSCULAR | Status: DC | PRN
  Administered 2014-11-24 (×2): 10 mg via INTRAVENOUS
  Administered 2014-11-24 (×2): 5 mg via INTRAVENOUS

## 2014-11-24 MED ORDER — ROCURONIUM BROMIDE 100 MG/10ML IV SOLN
INTRAVENOUS | Status: DC | PRN
  Administered 2014-11-24: 10 mg via INTRAVENOUS
  Administered 2014-11-24: 40 mg via INTRAVENOUS
  Administered 2014-11-24 (×3): 10 mg via INTRAVENOUS

## 2014-11-24 MED ORDER — BENAZEPRIL HCL 40 MG PO TABS
40.0000 mg | ORAL_TABLET | Freq: Every day | ORAL | Status: DC
  Administered 2014-11-25 – 2014-11-30 (×6): 40 mg via ORAL
  Filled 2014-11-24 (×6): qty 1

## 2014-11-24 MED ORDER — ONDANSETRON HCL 4 MG/2ML IJ SOLN
INTRAMUSCULAR | Status: DC | PRN
Start: 2014-11-24 — End: 2014-11-24
  Administered 2014-11-24: 4 mg via INTRAVENOUS

## 2014-11-24 MED ORDER — SODIUM CHLORIDE 0.9 % IR SOLN
Status: DC | PRN
  Administered 2014-11-24: 2000 mL
  Administered 2014-11-24 (×2): 1000 mL

## 2014-11-24 MED ORDER — HYDROMORPHONE HCL 1 MG/ML IJ SOLN
INTRAMUSCULAR | Status: AC
  Filled 2014-11-24: qty 1

## 2014-11-24 MED ORDER — PHENYLEPHRINE HCL 10 MG/ML IJ SOLN
10.0000 mg | INTRAVENOUS | Status: DC | PRN
  Administered 2014-11-24 (×3): 10 ug/min via INTRAVENOUS

## 2014-11-24 MED ORDER — POTASSIUM CHLORIDE IN NACL 20-0.9 MEQ/L-% IV SOLN
INTRAVENOUS | Status: DC
  Administered 2014-11-24 – 2014-11-27 (×6): via INTRAVENOUS
  Filled 2014-11-24 (×8): qty 1000

## 2014-11-24 MED ORDER — ONDANSETRON HCL 4 MG/2ML IJ SOLN: 4.0000 mg | Freq: Four times a day (QID) | INTRAMUSCULAR | Status: DC | PRN

## 2014-11-24 MED ORDER — DEXTROSE 5 % IV SOLN
1.0000 g | Freq: Four times a day (QID) | INTRAVENOUS | Status: AC
  Administered 2014-11-24 – 2014-11-25 (×3): 1 g via INTRAVENOUS
  Filled 2014-11-24 (×3): qty 1

## 2014-11-24 MED ORDER — METOPROLOL TARTRATE 50 MG PO TABS
50.0000 mg | ORAL_TABLET | Freq: Two times a day (BID) | ORAL | Status: DC
  Administered 2014-11-25 – 2014-11-30 (×11): 50 mg via ORAL
  Filled 2014-11-24 (×12): qty 1

## 2014-11-24 MED ORDER — DIPHENHYDRAMINE HCL 50 MG/ML IJ SOLN: 12.5000 mg | Freq: Four times a day (QID) | INTRAMUSCULAR | Status: DC | PRN

## 2014-11-24 MED ORDER — NALOXONE HCL 0.4 MG/ML IJ SOLN: 0.4000 mg | INTRAMUSCULAR | Status: DC | PRN

## 2014-11-24 MED ORDER — PROPOFOL 10 MG/ML IV BOLUS
INTRAVENOUS | Status: DC | PRN
  Administered 2014-11-24: 100 mg via INTRAVENOUS

## 2014-11-24 MED ORDER — MORPHINE SULFATE (PF) 1 MG/ML IV SOLN
INTRAVENOUS | Status: DC
  Administered 2014-11-24: 7 mg via INTRAVENOUS
  Administered 2014-11-24: 12:00:00 via INTRAVENOUS
  Administered 2014-11-24: 5 mg via INTRAVENOUS
  Administered 2014-11-25: 6.93 mg via INTRAVENOUS
  Administered 2014-11-25: 4 mg via INTRAVENOUS
  Filled 2014-11-24: qty 25

## 2014-11-24 MED ORDER — ONDANSETRON HCL 4 MG/2ML IJ SOLN
INTRAMUSCULAR | Status: AC
  Filled 2014-11-24: qty 2

## 2014-11-24 MED ORDER — GLYCOPYRROLATE 0.2 MG/ML IJ SOLN
INTRAMUSCULAR | Status: DC | PRN
  Administered 2014-11-24 (×2): 0.2 mg via INTRAVENOUS
  Administered 2014-11-24: 0.4 mg via INTRAVENOUS

## 2014-11-24 MED ORDER — LORAZEPAM 0.5 MG PO TABS
0.5000 mg | ORAL_TABLET | Freq: Two times a day (BID) | ORAL | Status: DC | PRN
  Administered 2014-11-24 – 2014-11-30 (×10): 0.5 mg via ORAL
  Filled 2014-11-24 (×10): qty 1

## 2014-11-24 MED ORDER — SERTRALINE HCL 50 MG PO TABS
50.0000 mg | ORAL_TABLET | Freq: Every day | ORAL | Status: DC
  Administered 2014-11-25 – 2014-11-30 (×6): 50 mg via ORAL
  Filled 2014-11-24 (×6): qty 1

## 2014-11-24 MED ORDER — ENOXAPARIN SODIUM 40 MG/0.4ML ~~LOC~~ SOLN
40.0000 mg | SUBCUTANEOUS | Status: DC
  Administered 2014-11-25 – 2014-11-30 (×6): 40 mg via SUBCUTANEOUS
  Filled 2014-11-24 (×6): qty 0.4

## 2014-11-24 MED ORDER — ACETAMINOPHEN 10 MG/ML IV SOLN: 1000.0000 mg | Freq: Once | INTRAVENOUS | Status: DC

## 2014-11-24 MED ORDER — PANTOPRAZOLE SODIUM 40 MG PO TBEC
40.0000 mg | DELAYED_RELEASE_TABLET | Freq: Every day | ORAL | Status: DC
  Administered 2014-11-25 – 2014-11-30 (×6): 40 mg via ORAL
  Filled 2014-11-24 (×6): qty 1

## 2014-11-24 MED ORDER — ACETAMINOPHEN 325 MG PO TABS
650.0000 mg | ORAL_TABLET | Freq: Four times a day (QID) | ORAL | Status: DC | PRN
  Administered 2014-11-25 – 2014-11-29 (×2): 650 mg via ORAL
  Filled 2014-11-24 (×2): qty 2

## 2014-11-24 MED ORDER — PROMETHAZINE HCL 25 MG/ML IJ SOLN: 6.2500 mg | INTRAMUSCULAR | Status: DC | PRN

## 2014-11-24 MED ORDER — FENTANYL CITRATE (PF) 100 MCG/2ML IJ SOLN
INTRAMUSCULAR | Status: DC | PRN
  Administered 2014-11-24: 50 ug via INTRAVENOUS
  Administered 2014-11-24 (×2): 100 ug via INTRAVENOUS

## 2014-11-24 MED ORDER — FENTANYL CITRATE (PF) 250 MCG/5ML IJ SOLN
INTRAMUSCULAR | Status: AC
  Filled 2014-11-24: qty 5

## 2014-11-24 MED ORDER — GABAPENTIN 600 MG PO TABS
600.0000 mg | ORAL_TABLET | Freq: Every day | ORAL | Status: DC
  Administered 2014-11-25 – 2014-11-30 (×6): 600 mg via ORAL
  Filled 2014-11-24 (×6): qty 1

## 2014-11-24 MED ORDER — DEXAMETHASONE SODIUM PHOSPHATE 4 MG/ML IJ SOLN
INTRAMUSCULAR | Status: AC
  Filled 2014-11-24: qty 2

## 2014-11-24 MED ORDER — ONDANSETRON HCL 4 MG PO TABS: 4.0000 mg | ORAL_TABLET | Freq: Four times a day (QID) | ORAL | Status: DC | PRN

## 2014-11-24 MED ORDER — POLYVINYL ALCOHOL 1.4 % OP SOLN
1.0000 [drp] | OPHTHALMIC | Status: DC | PRN
  Administered 2014-11-29: 1 [drp] via OPHTHALMIC
  Filled 2014-11-24: qty 15

## 2014-11-24 MED ORDER — MORPHINE SULFATE (PF) 1 MG/ML IV SOLN
INTRAVENOUS | Status: AC
  Administered 2014-11-24: 7 mg
  Administered 2014-11-24: 23:00:00
  Filled 2014-11-24: qty 25

## 2014-11-24 MED ORDER — HYPROMELLOSE (GONIOSCOPIC) 2.5 % OP SOLN
1.0000 [drp] | OPHTHALMIC | Status: DC | PRN
  Filled 2014-11-24: qty 15

## 2014-11-24 MED ORDER — LIDOCAINE HCL (CARDIAC) 20 MG/ML IV SOLN
INTRAVENOUS | Status: DC | PRN
  Administered 2014-11-24: 100 mg via INTRAVENOUS

## 2014-11-24 MED ORDER — LACTATED RINGERS IV SOLN
INTRAVENOUS | Status: DC | PRN
  Administered 2014-11-24 (×2): via INTRAVENOUS

## 2014-11-24 MED ORDER — ROCURONIUM BROMIDE 50 MG/5ML IV SOLN
INTRAVENOUS | Status: AC
  Filled 2014-11-24: qty 1

## 2014-11-24 MED ORDER — PROPOFOL 10 MG/ML IV BOLUS
INTRAVENOUS | Status: AC
  Filled 2014-11-24: qty 20

## 2014-11-24 MED ORDER — TRAZODONE HCL 50 MG PO TABS
50.0000 mg | ORAL_TABLET | Freq: Every day | ORAL | Status: DC
  Administered 2014-11-24 – 2014-11-29 (×6): 50 mg via ORAL
  Filled 2014-11-24 (×6): qty 1

## 2014-11-24 MED ORDER — LIDOCAINE HCL (CARDIAC) 20 MG/ML IV SOLN
INTRAVENOUS | Status: AC
  Filled 2014-11-24: qty 5

## 2014-11-24 MED ORDER — SODIUM CHLORIDE 0.9 % IJ SOLN: 9.0000 mL | INTRAMUSCULAR | Status: DC | PRN

## 2014-11-24 MED ORDER — DIPHENHYDRAMINE HCL 12.5 MG/5ML PO ELIX: 12.5000 mg | ORAL_SOLUTION | Freq: Four times a day (QID) | ORAL | Status: DC | PRN

## 2014-11-24 MED ORDER — TRIAMCINOLONE ACETONIDE 0.1 % EX CREA
1.0000 "application " | TOPICAL_CREAM | Freq: Two times a day (BID) | CUTANEOUS | Status: DC | PRN
  Filled 2014-11-24: qty 15

## 2014-11-24 MED ORDER — MEPERIDINE HCL 25 MG/ML IJ SOLN: 6.2500 mg | INTRAMUSCULAR | Status: DC | PRN

## 2014-11-24 MED ORDER — ALVIMOPAN 12 MG PO CAPS
12.0000 mg | ORAL_CAPSULE | Freq: Once | ORAL | Status: AC
  Administered 2014-11-24: 12 mg via ORAL
  Filled 2014-11-24: qty 1

## 2014-11-24 MED ORDER — NEOSTIGMINE METHYLSULFATE 10 MG/10ML IV SOLN
INTRAVENOUS | Status: DC | PRN
  Administered 2014-11-24: 3 mg via INTRAVENOUS

## 2014-11-24 SURGICAL SUPPLY — 58 items
BLADE SURG ROTATE 9660 (MISCELLANEOUS) IMPLANT
CANISTER SUCTION 2500CC (MISCELLANEOUS) ×2 IMPLANT
CHLORAPREP W/TINT 26ML (MISCELLANEOUS) ×2 IMPLANT
COVER MAYO STAND STRL (DRAPES) ×6 IMPLANT
COVER SURGICAL LIGHT HANDLE (MISCELLANEOUS) ×2 IMPLANT
DRAPE LAPAROSCOPIC ABDOMINAL (DRAPES) ×2 IMPLANT
DRAPE PROXIMA HALF (DRAPES) ×2 IMPLANT
DRAPE UTILITY XL STRL (DRAPES) ×4 IMPLANT
DRAPE WARM FLUID 44X44 (DRAPE) ×2 IMPLANT
DRSG OPSITE POSTOP 3X4 (GAUZE/BANDAGES/DRESSINGS) ×2 IMPLANT
DRSG OPSITE POSTOP 4X10 (GAUZE/BANDAGES/DRESSINGS) IMPLANT
DRSG OPSITE POSTOP 4X8 (GAUZE/BANDAGES/DRESSINGS) ×2 IMPLANT
ELECT BLADE 6.5 EXT (BLADE) ×2 IMPLANT
ELECT CAUTERY BLADE 6.4 (BLADE) ×4 IMPLANT
ELECT REM PT RETURN 9FT ADLT (ELECTROSURGICAL) ×2
ELECTRODE REM PT RTRN 9FT ADLT (ELECTROSURGICAL) ×1 IMPLANT
GEL ULTRASOUND 20GR AQUASONIC (MISCELLANEOUS) ×2 IMPLANT
GLOVE BIO SURGEON STRL SZ7 (GLOVE) ×4 IMPLANT
GLOVE BIOGEL PI IND STRL 7.0 (GLOVE) ×4 IMPLANT
GLOVE BIOGEL PI IND STRL 7.5 (GLOVE) ×2 IMPLANT
GLOVE BIOGEL PI INDICATOR 7.0 (GLOVE) ×4
GLOVE BIOGEL PI INDICATOR 7.5 (GLOVE) ×2
GLOVE SURG ORTHO 8.0 STRL STRW (GLOVE) ×4 IMPLANT
GOWN STRL REUS W/ TWL LRG LVL3 (GOWN DISPOSABLE) ×6 IMPLANT
GOWN STRL REUS W/ TWL XL LVL3 (GOWN DISPOSABLE) ×2 IMPLANT
GOWN STRL REUS W/TWL LRG LVL3 (GOWN DISPOSABLE) ×6
GOWN STRL REUS W/TWL XL LVL3 (GOWN DISPOSABLE) ×2
KIT BASIN OR (CUSTOM PROCEDURE TRAY) ×2 IMPLANT
KIT ROOM TURNOVER OR (KITS) ×2 IMPLANT
LEGGING LITHOTOMY PAIR STRL (DRAPES) ×2 IMPLANT
NS IRRIG 1000ML POUR BTL (IV SOLUTION) ×8 IMPLANT
PACK LAPAROSCOPY I 1258 (SET/KITS/TRAYS/PACK) ×2 IMPLANT
PAD ARMBOARD 7.5X6 YLW CONV (MISCELLANEOUS) ×4 IMPLANT
PENCIL BUTTON HOLSTER BLD 10FT (ELECTRODE) ×4 IMPLANT
SCALPEL HARMONIC ACE (MISCELLANEOUS) IMPLANT
SPECIMEN JAR LARGE (MISCELLANEOUS) ×2 IMPLANT
SPONGE LAP 18X18 X RAY DECT (DISPOSABLE) ×6 IMPLANT
STAPLER CUT CVD 40MM BLUE (STAPLE) ×2 IMPLANT
STAPLER CUT CVD 40MM GREEN (STAPLE) ×2 IMPLANT
STAPLER VISISTAT 35W (STAPLE) ×2 IMPLANT
SUCTION POOLE TIP (SUCTIONS) ×2 IMPLANT
SURGILUBE 2OZ TUBE FLIPTOP (MISCELLANEOUS) ×2 IMPLANT
SUT PDS AB 1 CTX 36 (SUTURE) ×4 IMPLANT
SUT PDS AB 1 TP1 96 (SUTURE) ×4 IMPLANT
SUT PROLENE 2 0 CT2 30 (SUTURE) ×2 IMPLANT
SUT PROLENE 2 0 KS (SUTURE) IMPLANT
SUT SILK 2 0 SH CR/8 (SUTURE) ×2 IMPLANT
SUT SILK 2 0 TIES 10X30 (SUTURE) ×2 IMPLANT
SUT SILK 3 0 SH CR/8 (SUTURE) ×2 IMPLANT
SUT SILK 3 0 TIES 10X30 (SUTURE) ×2 IMPLANT
SYR BULB IRRIGATION 50ML (SYRINGE) ×2 IMPLANT
TOWEL OR 17X26 10 PK STRL BLUE (TOWEL DISPOSABLE) ×2 IMPLANT
TRAY FOLEY CATH 16FRSI W/METER (SET/KITS/TRAYS/PACK) ×2 IMPLANT
TRAY LAPAROSCOPIC (CUSTOM PROCEDURE TRAY) ×2 IMPLANT
TRAY PROCTOSCOPIC FIBER OPTIC (SET/KITS/TRAYS/PACK) ×2 IMPLANT
TUBE CONNECTING 12X1/4 (SUCTIONS) ×4 IMPLANT
TUBING INSUFFLATION (TUBING) IMPLANT
YANKAUER SUCT BULB TIP NO VENT (SUCTIONS) ×4 IMPLANT

## 2014-11-24 NOTE — Op Note (Signed)
Preop diagnosis: Sigmoid diverticulitis with descending colostomy and history of colovaginal fistula Postop diagnosis: Same Procedure performed: Sigmoid colectomy with colostomy reversal Surgeon: Geraldyn Shain K. Assistant:  Dr. Armandina Gemma Anesthesia: Gen. Endotracheal Indications: This is an 81 year old female who presents 6 months after having an urgent right hemicolectomy for obstructing colon cancer.  At that time she had also been diagnosed with a colovaginal fistula from recurrent diverticulitis.  We performed a right hemicolectomy with primary anastomosis and then created a descending colostomy to divert stool from her distal colon to allow the colovaginal fistula to heal. She has undergone examination under anesthesia and the fistula is no longer evident. However the sigmoid colon appears quite strictured and tortuous. We recommended sigmoid colectomy along with colostomy reversal.  Description of procedure: Patient is brought to the operating room and placed in the supine position on the operating room table. After an adequate level of general anesthesia was obtained, a Foley catheter was placed under sterile technique. The patient's legs were placed in lithotomy position in yellowfin stirrups. Her colostomy bag was removed and a pursestring suture was placed to occlude the colostomy. Her abdomen was prepped with ChloraPrep and her stoma and perineum were prepped with Betadine. We draped in sterile fashion. A timeout was taken to ensure the proper patient and proper procedure.  We made a vertical midline incision through her old scar. Dissection was carried down to the fascia with cautery. We into the peritoneal cavity. The omentum was adherent to the fascia just below the incision. We took down these adhesions with cautery. The remainder of the abdomen has minimal adhesions. We examined the small bowel and there are some adhesions to the colon which were divided. We used a Balfour retractor packed  the small bowel in the upper abdomen. We examined the sigmoid colon from the staple line and distally. In the distal sigmoid colon there is an area of thickening and inflammation that is densely adherent to the anterior and right side of the pelvis. Distally the rectum feels soft and normal in thickness. We mobilized the sigmoid colon away from the lateral abdominal wall by dividing the peritoneum at the white line of Toldt. The LigaSure device was used to divide the mesentery of the sigmoid colon. We continued dissecting until we were distal to the area of inflammation. The upper rectum was then divided with a contour green stapler. The specimen was passed off the field sent for pathologic examination. There was a single bleeding area at the edge of the staple line was oversewn. We inspected again for hemostasis and irrigated the pelvis.  I made an elliptical incision around the old colostomy. Cautery was used to dissect the colostomy with from the surrounding tissue down the fascia. We brought the colostomy into the abdomen and freed up from all the other adhesions to the abdominal wall. We then mobilized the descending colon and the splenic flexure by scoring the peritoneum. Once we had mobilized the descending colon enough to reach the pelvis amputated the most distal 3 cm of the old colostomy site.  A purse string suture of 2-0 Prolene was placed at the edge of the descending colon. EEA sizers were used to select a 29 mm EEA stapler. The anvil was placed in the descending colon and secured using the pursestring suture. I then went below and performed a rectal examination. We evacuated some firm mucous plugs from the rectal vault. There seemed to be some tethering of the upper rectum to the posterior abdominal  wall. Further mobilization was done to straighten out the rectum. I was then able to pass the EEA stapler up the rectal stump. We brought the spike of the stapler out of the anterior wall of the rectum and  connected it to the anvil. We tightened the stapler down and created a EEA stapled anastomosis. The donuts were intact. We filled the pelvis with saline and I performed a rigid sigmoidoscopy. The staple line is not bleeding. There is no sign of air leak. The anastomosis does not seem to be under any tension. We irrigated the abdomen thoroughly. Gown and gloves were changed in accordance with the colon Protocol. The fascia at the old ostomy site was closed with 2 layers of #1 PDS. The midline fascia was reapproximated with double-stranded #1 PDS suture. The subcutaneous tissues were irrigated. Staples were used to close the skin. Clinical dressing was applied.  The patient was then extubated and brought to the recovery room in stable condition. All sponge, initially, and needle counts are correct.    [name]. Georgette Dover, MD, [facility]  General/ Trauma Surgery  11/24/2014 10:51 AM

## 2014-11-24 NOTE — Care Management Note (Signed)
Case Management Note  Patient Details  Name: [name] MRN: [id number] Date of Birth: [birth year]  Subjective/Objective:                    Action/Plan:  UR completed  Expected Discharge Date:                  Expected Discharge Plan:  Home/Self Care  In-House Referral:     Discharge planning Services     Post Acute Care Choice:    Choice offered to:     DME Arranged:    DME Agency:     HH Arranged:    [place] Agency:     Status of Service:  In process, will continue to follow  Medicare Important Message Given:    Date Medicare IM Given:    Medicare IM give by:    Date Additional Medicare IM Given:    Additional Medicare Important Message give by:     If discussed at [place] of Stay Meetings, dates discussed:    Additional Comments:  Marilu Favre, RN 11/24/2014, 2:17 PM

## 2014-11-24 NOTE — H&P (Signed)
History of Present Illness  Patient words: f/u.  The patient is a 81 year old female who presents with colorectal cancer. The patient is a 81 year old female presenting for a post-operative visit. s/p right hemicolectomy with primary anastomosis on 06/02/14 for an obstructing tumor of the ileocecal valve. She also underwent descending colostomy for rectovaginal fistula. Her pathology report showed two separate tumors with positive lymph nodes. She was evaluated by Oncology during hospitalization and the decision was made to not proceed with any further therapy. She was discharged to rehab on 06/10/14. She reports that her appetite is improving and that her ostomy continues to function. Her incision is completely healed. Last week, she underwent a vaginal and anal examination under anesthesia by Dr. [name]. There were no signs of any remaining colovaginal fistula. However, her sigmoid colon was significantly scarred and strictured. A recent CT scan showed extensive diverticulosis of the sigmoid colon. After discussion with Dr. Marcello Moores, there is concern that if we just closed her colostomy, she could have future problems with sigmoid stricture or even recurrent diverticulitis. She is here today to discuss these recent findings and to plan her next surgery. She is eager to get rid of her colostomy.  She has had no further vaginal drainage  Her appetite and energy level are improving. Her mobility is limited by her left knee problems.   CLINICAL DATA: History of rectovaginal fistula after descending colostomy, history right hemicolectomy for carcinoma.  EXAM: CT ABDOMEN AND PELVIS WITH CONTRAST  TECHNIQUE: Multidetector CT imaging of the abdomen and pelvis was performed using the standard protocol following bolus administration of intravenous contrast.  CONTRAST: 100 cc Isovue-300, and rectal contrast  COMPARISON: CT abdomen pelvis of 05/31/2014  FINDINGS: The lung bases  are clear. A moderate size hiatal hernia again is noted. Mild cardiomegaly is stable. The liver enhances with no focal abnormality and no ductal dilatation is seen. Surgical clips are present from prior cholecystectomy. The pancreas is normal in size and the pancreatic duct is not dilated. The adrenal glands and spleen are unremarkable. The stomach is decompressed. The kidneys enhance with no calculus or mass, and on delayed images, the pelvocaliceal systems are unremarkable. The proximal ureters are normal in caliber. The abdominal aorta is normal in caliber with only mild atheromatous change for age. No adenopathy is seen.  An ostomy is noted in the left lower quadrant. Rectal contrast was administered. In this patient with a history of rectovaginal fistula, no contrast enters the vagina or the urinary bladder. However, on several images there is linear soft tissue extension from the sigmoid colon toward the vagina which Splitt represent the area of prior fistulous communication which is not documented after rectal contrast, with no contrast seen to enter that linear extension. Multiple rectosigmoid colon diverticula again are noted. No present evidence of diverticulitis is seen. No free fluid is noted within the pelvis. The urinary bladder is not well distended but no abnormality is seen. Moderate degenerative joint disease is noted involving both hips. There are diffuse degenerative changes throughout the lumbar spine with mild scoliosis present. No compression deformity is seen.  IMPRESSION: *Although there is still a soft tissue linear extension noted extending from the region of the sigmoid colon toward the vagina, no contrast enters this extension and no air is seen within the vagina or the urinary bladder to suggest the presence of persistent fistulous communication. *Multiple diverticula within the rectosigmoid colon. *Stable moderate size hiatal hernia   Electronically  Signed By: Ivar Drape M.D. On: 09/14/2014 08:52 Allergies (Ammie Eversole, LPN; 11/19/3218 2:54 AM) Penicillin G Pot in Dextrose *PENICILLINS* IV Potassium No Known Drug Allergies 10/04/2014 (Marked as Inactive)  Medication History (Ammie Eversole, LPN; 08/21/621 7:62 AM) Keflex (500MG  Capsule, 1 (one) Capsule Oral three times daily, Taken starting 07/25/2014) Active. Promethazine HCl (12.5MG  Tablet, Oral) Active. Triamcinolone Acetonide (0.1% Cream, External) Active. Baclofen (10MG  Tablet, Oral) Active. Lotensin (40MG  Tablet, Oral) Active. Lasix (20MG  Tablet, Oral) Active. [facility] Tablet ER, Oral) Active. Florastor (250MG  Capsule, Oral) Active. Zoloft (50MG  Tablet, Oral) Active. Zocor (40MG  Tablet, Oral) Active. Cipro XR (500MG  Tablet ER 24HR, Oral) Active. Hydrocodone-Acetaminophen (5-325MG  Tablet, Oral) Active. Benazepril HCl (40MG  Tablet, Oral) Active. Furosemide (20MG  Tablet, Oral) Active. Gabapentin (600MG  Tablet, Oral) Active. Metoprolol Tartrate (50MG  Tablet, Oral) Active. MetroNIDAZOLE (500MG  Tablet, Oral) Active. Sertraline HCl (50MG  Tablet, Oral) Active. Simvastatin (40MG  Tablet, Oral) Active. Spironolactone (25MG  Tablet, Oral) Active. TraZODone HCl (100MG  Tablet, Oral) Active. Tylenol Extra Strength (500MG  Tablet, Oral) Active. Vitamin C (Oral) Active. Vitamin D (400UNIT Capsule, Oral) Active. Ferrous Sulfate (324 (65 Fe)MG Tablet DR, Oral) Active. Neurontin (600MG  Tablet, Oral) Active. Norco (5-325MG  Tablet, Oral) Active. Lopressor HCT (50-25MG  Tablet, Oral) Active. Multivitamin Adults 50+ (Oral) Active. Nitrostat (0.4MG  Tab Sublingual, Sublingual) Active. Ultram (50MG  Tablet, Oral) Active. Desyrel (100MG  Tablet, Oral) Active. LORazepam (0.5MG  Tablet, Oral) Active.    Vitals  10/17/2014 9:42 AM Weight: 171.2 lb Height: 63in Body Surface Area: 1.86 m Body Mass Index: 30.33 kg/m Temp.: 97.42F(Oral)  Pulse:  88 (Regular)  BP: 168/80 (Sitting, Left Arm, Standard)     Physical Exam   The physical exam findings are as follows: Note:WDWN in NAD Lungs - CTA B CV - RRR Abd - soft, healed midline incision; LLQ ostomy - pink with brown stool output No abdominal tenderness. Extremities - some swelling around left knee    Assessment & Plan  COLOVAGINAL FISTULA (619.1  N82.4) Story: Patient with signs and system consistent with colovaginal fistula. It is unclear to me whether this has closed completely or not. She would like to have colostomy reversal. I have recommended that she undergo exam under anesthesia with rigid sigmoidoscopy to evaluate for fistula patency.  COLON CANCER METASTASIZED TO MESENTERIC LYMPH NODES (153.9  C18.9)  Current Plans Schedule for Surgery - sigmoid colectomy/ colostomy reversal. The surgical procedure has been discussed with the patient. Potential risks, benefits, alternative treatments, and expected outcomes have been explained. All of the patient's questions at this time have been answered. The likelihood of reaching the patient's treatment goal is good. The patient understand the proposed surgical procedure and wishes to proceed.   [name]. Georgette Dover, MD, [facility]  General/ Trauma Surgery  11/24/2014 6:58 AM

## 2014-11-24 NOTE — Transfer of Care (Signed)
Immediate Anesthesia Transfer of Care Note  Patient: [name]  Procedure(s) Performed: Procedure(s): SIGMOID COLECTOMY AND COLSOTOMY CLOSURE (N/A)  Patient Location: PACU  Anesthesia Type:General  Level of Consciousness: awake, alert , oriented and patient cooperative  Airway & Oxygen Therapy: Patient Spontanous Breathing and Patient connected to nasal cannula oxygen  Post-op Assessment: Report given to RN and Post -op Vital signs reviewed and stable  Post vital signs: Reviewed and stable  Last Vitals:  Filed Vitals:   11/24/14 1049  BP:   Pulse:   Temp: 37 C  Resp:     Complications: No apparent anesthesia complications

## 2014-11-24 NOTE — Anesthesia Preprocedure Evaluation (Signed)
Anesthesia Evaluation  Patient identified by MRN, date of birth, ID band Patient awake    Reviewed: Allergy & Precautions, NPO status , Patient's Chart, lab work & pertinent test results, reviewed documented beta blocker date and time   History of Anesthesia Complications (+) PONV and history of anesthetic complications  Airway Mallampati: II  TM Distance: >3 FB Neck ROM: Full    Dental no notable dental hx. (+) Dental Advisory Given, Edentulous Upper, Edentulous Lower   Pulmonary shortness of breath, COPDformer smoker,  breath sounds clear to auscultation  Pulmonary exam normal       Cardiovascular hypertension, Pt. on medications and Pt. on home beta blockers + CAD, + Cardiac Stents and +CHF Normal cardiovascular examRhythm:Regular Rate:Normal     Neuro/Psych  Headaches, PSYCHIATRIC DISORDERS Anxiety Depression    GI/Hepatic negative GI ROS, Neg liver ROS, hiatal hernia, GERD-  Medicated and Controlled,  Endo/Other  negative endocrine ROS  Renal/GU Renal InsufficiencyRenal disease     Musculoskeletal  (+) Arthritis -,   Abdominal   Peds  Hematology  (+) anemia ,   Anesthesia Other Findings   Reproductive/Obstetrics negative OB ROS                             Anesthesia Physical  Anesthesia Plan  ASA: III  Anesthesia Plan: General   Post-op Pain Management:    Induction: Intravenous  Airway Management Planned: Oral ETT  Additional Equipment:   Intra-op Plan:   Post-operative Plan: Extubation in OR  Informed Consent: I have reviewed the patients History and Physical, chart, labs and discussed the procedure including the risks, benefits and alternatives for the proposed anesthesia with the patient or authorized representative who has indicated his/her understanding and acceptance.   Dental advisory given  Plan Discussed with: CRNA  Anesthesia Plan Comments:          Anesthesia Quick Evaluation

## 2014-11-24 NOTE — Anesthesia Postprocedure Evaluation (Signed)
Anesthesia Post Note  Patient: [name]  Procedure(s) Performed: Procedure(s) (LRB): SIGMOID COLECTOMY AND COLSOTOMY CLOSURE (N/A)  Anesthesia type: General  Patient location: PACU  Post pain: Pain level controlled  Post assessment: Post-op Vital signs reviewed  Last Vitals: BP 118/60 mmHg  Pulse 61  Temp(Src) 36.4 C (Oral)  Resp 18  Wt 169 lb 12.8 oz (77.021 kg)  SpO2 98%  Post vital signs: Reviewed  Level of consciousness: sedated  Complications: No apparent anesthesia complications

## 2014-11-25 LAB — BASIC METABOLIC PANEL
Anion gap: 8 (ref 5–15)
BUN: 27 mg/dL — ABNORMAL HIGH (ref 6–20)
CO2: 22 mmol/L (ref 22–32)
Calcium: 8.3 mg/dL — ABNORMAL LOW (ref 8.9–10.3)
Chloride: 103 mmol/L (ref 101–111)
Creatinine, Ser: 1.41 mg/dL — ABNORMAL HIGH (ref 0.44–1.00)
GFR calc Af Amer: 39 mL/min — ABNORMAL LOW (ref >60)
GFR calc non Af Amer: 34 mL/min — ABNORMAL LOW (ref >60)
Glucose, Bld: 146 mg/dL — ABNORMAL HIGH (ref 65–99)
Potassium: 4.6 mmol/L (ref 3.5–5.1)
Sodium: 133 mmol/L — ABNORMAL LOW (ref 135–145)

## 2014-11-25 LAB — CBC
HCT: 37.2 % (ref 36.0–46.0)
Hemoglobin: 12.2 g/dL (ref 12.0–15.0)
MCH: 28.9 pg (ref 26.0–34.0)
MCHC: 32.8 g/dL (ref 30.0–36.0)
MCV: 88.2 fL (ref 78.0–100.0)
Platelets: 129 10*3/uL — ABNORMAL LOW (ref 150–400)
RBC: 4.22 MIL/uL (ref 3.87–5.11)
RDW: 13.7 % (ref 11.5–15.5)
WBC: 10.7 10*3/uL — ABNORMAL HIGH (ref 4.0–10.5)

## 2014-11-25 MED ORDER — NALOXONE HCL 0.4 MG/ML IJ SOLN: 0.4000 mg | INTRAMUSCULAR | Status: DC | PRN

## 2014-11-25 MED ORDER — SODIUM CHLORIDE 0.9 % IJ SOLN: 9.0000 mL | INTRAMUSCULAR | Status: DC | PRN

## 2014-11-25 MED ORDER — DIPHENHYDRAMINE HCL 12.5 MG/5ML PO ELIX: 12.5000 mg | ORAL_SOLUTION | Freq: Four times a day (QID) | ORAL | Status: DC | PRN

## 2014-11-25 MED ORDER — DIPHENHYDRAMINE HCL 50 MG/ML IJ SOLN: 12.5000 mg | Freq: Four times a day (QID) | INTRAMUSCULAR | Status: DC | PRN

## 2014-11-25 MED ORDER — ONDANSETRON HCL 4 MG/2ML IJ SOLN: 4.0000 mg | Freq: Four times a day (QID) | INTRAMUSCULAR | Status: DC | PRN

## 2014-11-25 MED ORDER — MORPHINE SULFATE (PF) 1 MG/ML IV SOLN
INTRAVENOUS | Status: DC
  Administered 2014-11-25: 12:00:00 via INTRAVENOUS
  Administered 2014-11-25: 16.91 mg via INTRAVENOUS
  Administered 2014-11-25: 5.19 mg via INTRAVENOUS
  Administered 2014-11-25: 1.4 mg via INTRAVENOUS
  Administered 2014-11-26: 5 mg via INTRAVENOUS
  Administered 2014-11-26: 6 mg via INTRAVENOUS
  Administered 2014-11-26: 4 mg via INTRAVENOUS
  Administered 2014-11-26: 5 mg via INTRAVENOUS
  Administered 2014-11-26: 3 mg via INTRAVENOUS
  Administered 2014-11-26: 5 mg via INTRAVENOUS
  Administered 2014-11-26: 10:00:00 via INTRAVENOUS
  Administered 2014-11-27: 6 mg via INTRAVENOUS
  Administered 2014-11-27: 3 mg via INTRAVENOUS
  Administered 2014-11-27 (×2): 4 mg via INTRAVENOUS
  Administered 2014-11-27: 09:00:00 via INTRAVENOUS
  Administered 2014-11-27: 0.822 mg via INTRAVENOUS
  Administered 2014-11-27: 7 mg via INTRAVENOUS
  Administered 2014-11-27: 1 mg via INTRAVENOUS
  Administered 2014-11-27: 8 mg via INTRAVENOUS
  Administered 2014-11-28: 5.5 mg via INTRAVENOUS
  Administered 2014-11-28: 07:00:00 via INTRAVENOUS
  Administered 2014-11-28: 2 mg via INTRAVENOUS
  Filled 2014-11-25 (×4): qty 25

## 2014-11-25 NOTE — Clinical Social Work Note (Signed)
Clinical Social Work Assessment  Patient Details  Name: [name] MRN: [id number] Date of Birth: [birth year]  Date of referral:  11/25/14               Reason for consult:  Facility Placement                Permission sought to share information with:  Chartered certified accountant granted to share information::  Yes, Verbal Permission Granted  Name::        Agency::  [facility]  Relationship::     Contact Information:     Housing/Transportation Living arrangements for the past 2 months:  Single Family Home Source of Information:  Patient Patient Interpreter Needed:  None Criminal Activity/Legal Involvement Pertinent to Current Situation/Hospitalization:  No - Comment as needed Significant Relationships:  Adult Children, Friend Lives with:  Self Do you feel safe going back to the place where you live?  Yes Need for family participation in patient care:  No (Coment)  Care giving concerns:  Pt lives alone and does not feel safe going back straight after surgery   Social Worker assessment / plan:  CSW discussed PT recommendation for SNF.  Employment status:  Retired Forensic scientist:    PT Recommendations:  [place] / Referral to community resources:  [facility]  Patient/Family's Response to care:  Patient is very agreeable to SNF at [facility] where she has been in the past and feels comfortable  Patient/Family's Understanding of and Emotional Response to Diagnosis, Current Treatment, and Prognosis:  Patient seems to have good grasp of prognosis/ treatment plan and is hopeful to return home to her dog in a few short weeks of rehab.  Emotional Assessment Appearance:  Appears stated age Attitude/Demeanor/Rapport:    Affect (typically observed):  Appropriate Orientation:  Oriented to Situation, Oriented to  Time, Oriented to Self, Oriented to Place Alcohol / Substance use:    Psych involvement (Current and /or  in the community):  No (Comment)  Discharge Needs  Concerns to be addressed:    Readmission within the last 30 days:  No Current discharge risk:  Physical Impairment Barriers to Discharge:  Continued Medical Work up   [employer], LCSW 11/25/2014, 3:20 PM

## 2014-11-25 NOTE — Clinical Social Work Placement (Addendum)
   CLINICAL SOCIAL WORK PLACEMENT  NOTE  Date:  11/25/2014  Patient Details  Name: [name] MRN: [id number] Date of Birth: 1929-07-22  Clinical Social Work is seeking post-discharge placement for this patient at the [place] level of care (*CSW will initial, date and re-position this form in  chart as items are completed):  Yes   Patient/family provided with [place] Work Department's list of facilities offering this level of care within the geographic area requested by the patient (or if unable, by the patient's family).  Yes   Patient/family informed of their freedom to choose among providers that offer the needed level of care, that participate in Medicare, Medicaid or managed care program needed by the patient, have an available bed and are willing to accept the patient.  Yes   Patient/family informed of [facility]'s ownership interest in [facility] and [facility], as well as of the fact that they are under no obligation to receive care at these facilities.  PASRR submitted to EDS on       PASRR number received on       Existing PASRR number confirmed on 11/25/14     FL2 transmitted to all facilities in geographic area requested by pt/family on 11/25/14     FL2 transmitted to all facilities within larger geographic area on       Patient informed that his/her managed care company has contracts with or will negotiate with certain facilities, including the following:        Patient/family informed of bed offers received  11/26/14 (Updated Evette Cristal, MSW, [place], 11/30/14)  Patient chooses bed at [facility] (Updated Evette Cristal, MSW, LCSWA, 11/30/14)    Physician recommends and patient chooses bed at      Patient to be transferred to [facility]  on 11/30/14.  (Updated Evette Cristal, MSW, LCSWA, 11/30/14)  Patient to be transferred to facility by Patient's daughter  (Updated Evette Cristal, MSW, LCSWA, 11/30/14)  Patient  family notified on   of transfer. 11/30/14  (Updated Evette Cristal, MSW, LCSWA, 11/30/14)  Name of family member notified:  [name]  (Updated Evette Cristal, MSW, LCSWA, 11/30/14)  PHYSICIAN       Additional Comment:    _______________________________________________ Cranford Mon, LCSW 11/25/2014, 3:32 PM    Jones Broom. Norval Morton, MSW, [place] 11/30/2014 10:57 AM (Updated Evette Cristal, MSW, LCSWA, 11/30/14)

## 2014-11-25 NOTE — Evaluation (Signed)
Occupational Therapy Evaluation Patient Details Name: [name] MRN: [id number] DOB: 01-20-1930 Today's Date: 11/25/2014    History of Present Illness 81 yo female s/p SIGMOID COLECTOMY AND COLSOTOMY CLOSURE   Clinical Impression   Patient is s/p sigmoid colectomy and colsotomy closure surgery resulting in functional limitations due to the deficits listed below (see OT problem list). Pt with first surgery in November 2015 requiring SNF placement at [facility]. Pt prefer [facility] if possible. Patient will benefit from skilled OT acutely to increase independence and safety with ADLS to allow discharge SNF.  Pt wants [facility] snf placement first choice and pt states "really only choice" but if other bed options need to be presented please look into [place] locations. Pt s daughter works in [employer]. Pt enjoyed the therapy at [facility] but another large factor is the patients need to have her dog visit at this facility.      Follow Up Recommendations  SNF;Supervision/Assistance - 24 hour    Equipment Recommendations   (DEFER TO VENUE)    Recommendations for Other Services       Precautions / Restrictions Precautions Precautions: Fall Precaution Comments: abdominal binder Restrictions Weight Bearing Restrictions: No      Mobility Bed Mobility Overal bed mobility: Needs Assistance Bed Mobility: Supine to Sit;Rolling Rolling: Min assist   Supine to sit: Mod assist     General bed mobility comments: cues for safety and hand placement  Transfers Overall transfer level: Needs assistance Equipment used: Rolling walker (2 wheeled) Transfers: Sit to/from Stand Sit to Stand: Mod assist;+2 physical assistance         General transfer comment: cues for hand placement    Balance                                            ADL Overall ADL's : Needs assistance/impaired Eating/Feeding: Set up;Bed level Eating/Feeding Details  (indicate cue type and reason): liquids present on arrival Grooming: Wash/dry face;Supervision/safety;Bed level               Lower Body Dressing: Total assistance   Toilet Transfer: +2 for physical assistance;Maximal assistance           Functional mobility during ADLs:  (unable to complete) General ADL Comments: Pt supine on arrival and very pleasant agreeable. pt very pleasant and needs encouragement to report pain / discomfort. Pt with abdominal binder applied in supine. Pt completed EOB sitting but unable to tolerate static standing longer than 45 seconds. Pt returned to supine.     Vision     Perception     Praxis      Pertinent Vitals/Pain Pain Assessment: 0-10 Pain Score: 8  Pain Location: belly Pain Descriptors / Indicators: Constant Pain Intervention(s): Monitored during session;Repositioned;Limited activity within patient's tolerance     Hand Dominance Right   Extremity/Trunk Assessment Upper Extremity Assessment Upper Extremity Assessment: Overall WFL for tasks assessed (baseline numbness)   Lower Extremity Assessment Lower Extremity Assessment: Defer to PT evaluation   Cervical / Trunk Assessment Cervical / Trunk Assessment: Kyphotic   Communication Communication Communication: No difficulties   Cognition Arousal/Alertness: Awake/alert Behavior During Therapy: WFL for tasks assessed/performed Overall Cognitive Status: Within Functional Limits for tasks assessed                     General Comments  Exercises       Shoulder Instructions      Home Living Family/patient expects to be discharged to:: Skilled nursing facility [facility]) Living Arrangements: Alone                               Additional Comments: Pt lives at home alone  (has a Engineer, maintenance)      Prior Functioning/Environment Level of Independence: Independent with assistive device(s)        Comments: ambulates with rollator    OT  Diagnosis: Generalized weakness;Acute pain   OT Problem List: Decreased strength;Decreased activity tolerance;Impaired balance (sitting and/or standing);Decreased safety awareness;Decreased knowledge of use of DME or AE;Decreased knowledge of precautions;Pain   OT Treatment/Interventions: Self-care/ADL training;Therapeutic exercise;DME and/or AE instruction;Therapeutic activities;Patient/family education;Balance training    OT Goals(Current goals can be found in the care plan section) Acute Rehab OT Goals Patient Stated Goal: to go to [place] place if she has to because its close to home and her dog can visit OT Goal Formulation: With patient/family Time For Goal Achievement: 12/09/14 Potential to Achieve Goals: Good  OT Frequency: Min 2X/week   Barriers to D/C: Decreased caregiver support          Co-evaluation              End of Session Equipment Utilized During Treatment: Gait belt;Other (comment) (ABDOMINAL BINDER) Nurse Communication: Mobility status;Precautions  Activity Tolerance: Patient limited by pain Patient left: in bed;with call bell/phone within reach;with bed alarm set;with family/visitor present   Time: 7322-0254 OT Time Calculation (min): 45 min Charges:  OT General Charges $OT Visit: 1 Procedure OT Evaluation $Initial OT Evaluation Tier I: 1 Procedure OT Treatments $Self Care/Home Management : 23-37 mins G-Codes:    Peri Maris 2014-12-16, 10:42 AM  Pager: [phone]

## 2014-11-25 NOTE — Evaluation (Signed)
Physical Therapy Evaluation Patient Details Name: [name] MRN: [id number] DOB: 1929/12/13 Today's Date: 11/25/2014   History of Present Illness  81 yo female s/p SIGMOID COLECTOMY AND COLSOTOMY CLOSURE  Clinical Impression  Patient is seen following the above procedure, presenting with functional limitations due to the deficits listed below (see PT Problem List). Increased pain throughout therapy session although motivated and willing to participate in transfer training activities. Requires min assist bed mobility and transfers to [facility] and reclining chair. Discussed importance of being mobile as able, during recovery. Patient will benefit from skilled PT to increase their independence and safety with mobility to allow discharge to the venue listed below.       Follow Up Recommendations SNF;Supervision for mobility/OOB    Equipment Recommendations   (TBD at next venue of care)    Recommendations for Other Services       Precautions / Restrictions Precautions Precautions: Fall Precaution Comments: abdominal binder Restrictions Weight Bearing Restrictions: No      Mobility  Bed Mobility Overal bed mobility: Needs Assistance Bed Mobility: Rolling;Sidelying to Sit Rolling: Min assist Sidelying to sit: Min assist;HOB elevated Supine to sit: Mod assist     General bed mobility comments: min assist for pt to roll onto side and assist with reach for bed rail. Very minimal assist for trucal support to rise into sitting.  Transfers Overall transfer level: Needs assistance Equipment used: Rolling walker (2 wheeled) Transfers: Sit to/from [facility] Sit to Stand: Min assist Stand pivot transfers: Min assist       General transfer comment: Min assist for boost to stand with VC for hand placement. Very slow and guarded. Performed from lowest bed setting and BSC. Small pivotal steps with min assist for walker placement and cues for sequencing with pivot to Lt and  right from bed to [facility], and BSC to chair.  Ambulation/Gait                Stairs            Wheelchair Mobility    Modified Rankin (Stroke Patients Only)       Balance Overall balance assessment: Needs assistance Sitting-balance support: No upper extremity supported;Feet supported Sitting balance-Leahy Scale: Fair     Standing balance support: No upper extremity supported;During functional activity Standing balance-Leahy Scale: Fair (reaching for seat)                               Pertinent Vitals/Pain Pain Assessment: 0-10 Pain Score: 8  Pain Location: belly Pain Descriptors / Indicators: Constant Pain Intervention(s): Monitored during session;Repositioned;Limited activity within patient's tolerance    Home Living Family/patient expects to be discharged to:: Skilled nursing facility [facility] place) Living Arrangements: Alone               Additional Comments: Pt lives at home alone  (has a Engineer, maintenance)    Prior Function Level of Independence: Independent with assistive device(s)         Comments: ambulates with rollator     Hand Dominance   Dominant Hand: Right    Extremity/Trunk Assessment   Upper Extremity Assessment: Defer to OT evaluation           Lower Extremity Assessment: Generalized weakness      Cervical / Trunk Assessment: Kyphotic  Communication   Communication: No difficulties  Cognition Arousal/Alertness: Awake/alert Behavior During Therapy: WFL for tasks assessed/performed Overall Cognitive Status:  Within Functional Limits for tasks assessed                      General Comments General comments (skin integrity, edema, etc.): wearing abdominal brace    Exercises General Exercises - Lower Extremity Ankle Circles/Pumps: AROM;Both;10 reps;Seated      Assessment/Plan    PT Assessment Patient needs continued PT services  PT Diagnosis Difficulty walking;Generalized weakness;Acute pain    PT Problem List Decreased strength;Decreased range of motion;Decreased activity tolerance;Decreased balance;Decreased mobility;Decreased knowledge of use of DME;Decreased knowledge of precautions;Pain  PT Treatment Interventions DME instruction;Gait training;Functional mobility training;Therapeutic activities;Therapeutic exercise;Balance training;Neuromuscular re-education;Patient/family education;Modalities   PT Goals (Current goals can be found in the Care Plan section) Acute Rehab PT Goals Patient Stated Goal: to go to [facility] place if she has to because its close to home and her dog can visit PT Goal Formulation: With patient Time For Goal Achievement: 12/09/14 Potential to Achieve Goals: Good    Frequency Min 3X/week   Barriers to discharge Decreased caregiver support lives alone    Co-evaluation               End of Session Equipment Utilized During Treatment: Gait belt;Other (comment) (abdominal binder) Activity Tolerance: Patient limited by fatigue;Patient limited by pain Patient left: in chair;with call bell/phone within reach;with family/visitor present;with SCD's reapplied Nurse Communication: Mobility status         Time: 1037-1101 PT Time Calculation (min) (ACUTE ONLY): 24 min   Charges:   PT Evaluation $Initial PT Evaluation Tier I: 1 Procedure PT Treatments $Therapeutic Activity: 8-22 mins   PT G CodesEllouise Newer 11/25/2014, 11:55 AM Elayne Snare, [place]

## 2014-11-25 NOTE — Progress Notes (Signed)
1 Day Post-Op  Subjective: Patient's pain is poorly controlled on reduced dose PCA - will change to full-dose No nausea or vomiting Foley removed - has not yet voided   Objective: Vital signs in last 24 hours: Temp:  [97.5 F (36.4 C)-99 F (37.2 C)] 98.6 F (37 C) (05/13 0500) Pulse Rate:  [49-91] 80 (05/13 0500) Resp:  [12-23] 21 (05/13 0500) BP: (103-122)/(44-60) 122/53 mmHg (05/13 0500) SpO2:  [95 %-100 %] 95 % (05/13 0500) Last BM Date: 11/24/14  Intake/Output from previous day: 05/12 0701 - 05/13 0700 In: 2865 [P.O.:240; I.V.:2625] Out: 920 [Urine:770; Blood:150] Intake/Output this shift:    General appearance: alert, cooperative and uncomfortable Resp: clear to auscultation bilaterally Cardio: regular rate and rhythm, S1, S2 normal, no murmur, click, rub or gallop GI: occasional bowel sounds; minimally distended; incisional tenderness Incisions c/d/i  Lab Results:   Recent Labs  11/24/14 1415 11/25/14 0250  WBC 12.9* 10.7*  HGB 14.4 12.2  HCT 43.7 37.2  PLT 139* 129*   BMET  Recent Labs  11/24/14 1415 11/25/14 0250  NA  --  133*  K  --  4.6  CL  --  103  CO2  --  22  GLUCOSE  --  146*  BUN  --  27*  CREATININE 1.16* 1.41*  CALCIUM  --  8.3*   PT/INR No results for input(s): LABPROT, INR in the last 72 hours. ABG No results for input(s): PHART, HCO3 in the last 72 hours.  Invalid input(s): PCO2, PO2  Studies/Results: No results found.  Anti-infectives: Anti-infectives    Start     Dose/Rate Route Frequency Ordered Stop   11/24/14 1400  cefOXitin (MEFOXIN) 1 g in dextrose 5 % 50 mL IVPB     1 g 100 mL/hr over 30 Minutes Intravenous Every 6 hours 11/24/14 1246 11/25/14 0226   11/24/14 0700  cefOXitin (MEFOXIN) 2 g in dextrose 5 % 50 mL IVPB     2 g 100 mL/hr over 30 Minutes Intravenous To Surgery 11/23/14 1329 11/24/14 0753      Assessment/Plan: s/p Procedure(s): SIGMOID COLECTOMY AND COLSOTOMY CLOSURE (N/A) Clear liquids until  bowel function returns  Creatinine up slightly today - continue IV hydration; will not use Toradol; recheck labs in AM Encourage ambulation - PT/ OT consults   LOS: 1 day    Marcellous Snarski K. 11/25/2014

## 2014-11-26 LAB — CBC
HCT: 36.6 % (ref 36.0–46.0)
Hemoglobin: 11.8 g/dL — ABNORMAL LOW (ref 12.0–15.0)
MCH: 29.1 pg (ref 26.0–34.0)
MCHC: 32.2 g/dL (ref 30.0–36.0)
MCV: 90.4 fL (ref 78.0–100.0)
Platelets: 117 10*3/uL — ABNORMAL LOW (ref 150–400)
RBC: 4.05 MIL/uL (ref 3.87–5.11)
RDW: 14.1 % (ref 11.5–15.5)
WBC: 12 10*3/uL — ABNORMAL HIGH (ref 4.0–10.5)

## 2014-11-26 LAB — BASIC METABOLIC PANEL
Anion gap: 7 (ref 5–15)
BUN: 16 mg/dL (ref 6–20)
CO2: 23 mmol/L (ref 22–32)
Calcium: 8.6 mg/dL — ABNORMAL LOW (ref 8.9–10.3)
Chloride: 105 mmol/L (ref 101–111)
Creatinine, Ser: 1.1 mg/dL — ABNORMAL HIGH (ref 0.44–1.00)
GFR calc Af Amer: 53 mL/min — ABNORMAL LOW (ref >60)
GFR calc non Af Amer: 45 mL/min — ABNORMAL LOW (ref >60)
Glucose, Bld: 112 mg/dL — ABNORMAL HIGH (ref 65–99)
Potassium: 4.5 mmol/L (ref 3.5–5.1)
Sodium: 135 mmol/L (ref 135–145)

## 2014-11-26 NOTE — Clinical Social Work Note (Signed)
Clinical Social Worker met with patient and pt's son present at bedside to present bed offers. Pt and pt's son chooses bed at [facility] and [facility]. CSW has notified facility.   CSW remains available.   Glendon Axe, MSW, LCSWA [phone] 11/26/2014 3:08 PM

## 2014-11-26 NOTE — Progress Notes (Signed)
2 Days Post-Op  Subjective: Sore and slightly nauseated No SOB No flatus yet  Objective: Vital signs in last 24 hours: Temp:  [98.2 F (36.8 C)-100.2 F (37.9 C)] 98.4 F (36.9 C) (05/14 0132) Pulse Rate:  [79-99] 87 (05/14 0132) Resp:  [14-33] 24 (05/14 0746) BP: (136-166)/(59-83) 158/66 mmHg (05/14 0132) SpO2:  [95 %-99 %] 95 % (05/14 0746) Last BM Date: 11/24/14  Intake/Output from previous day: 05/13 0701 - 05/14 0700 In: 1307.5 [P.O.:360; I.V.:947.5] Out: 425 [Urine:425] Intake/Output this shift: Total I/O In: 120 [P.O.:120] Out: -   Lungs are clear Abdomen soft, non distended, +bowel sounds  Lab Results:   Recent Labs  11/25/14 0250 11/26/14 0626  WBC 10.7* 12.0*  HGB 12.2 11.8*  HCT 37.2 36.6  PLT 129* 117*   BMET  Recent Labs  11/25/14 0250 11/26/14 0626  NA 133* 135  K 4.6 4.5  CL 103 105  CO2 22 23  GLUCOSE 146* 112*  BUN 27* 16  CREATININE 1.41* 1.10*  CALCIUM 8.3* 8.6*   PT/INR No results for input(s): LABPROT, INR in the last 72 hours. ABG No results for input(s): PHART, HCO3 in the last 72 hours.  Invalid input(s): PCO2, PO2  Studies/Results: No results found.  Anti-infectives: Anti-infectives    Start     Dose/Rate Route Frequency Ordered Stop   11/24/14 1400  cefOXitin (MEFOXIN) 1 g in dextrose 5 % 50 mL IVPB     1 g 100 mL/hr over 30 Minutes Intravenous Every 6 hours 11/24/14 1246 11/25/14 0226   11/24/14 0700  cefOXitin (MEFOXIN) 2 g in dextrose 5 % 50 mL IVPB     2 g 100 mL/hr over 30 Minutes Intravenous To Surgery 11/23/14 1329 11/24/14 0753      Assessment/Plan: s/p Procedure(s): SIGMOID COLECTOMY AND COLSOTOMY CLOSURE (N/A)  Continue current care Out of bed Keep on clears UOP and creatinine improved  LOS: 2 days    Pessy Delamar A 11/26/2014

## 2014-11-27 MED ORDER — BOOST / RESOURCE BREEZE PO LIQD
1.0000 | Freq: Three times a day (TID) | ORAL | Status: DC
  Administered 2014-11-27 – 2014-11-30 (×9): 1 via ORAL

## 2014-11-27 MED ORDER — ALUM & MAG HYDROXIDE-SIMETH 200-200-20 MG/5ML PO SUSP
30.0000 mL | Freq: Four times a day (QID) | ORAL | Status: DC | PRN
  Administered 2014-11-29 – 2014-11-30 (×3): 30 mL via ORAL
  Filled 2014-11-27 (×4): qty 30

## 2014-11-27 MED ORDER — GI COCKTAIL ~~LOC~~
30.0000 mL | Freq: Once | ORAL | Status: AC
  Administered 2014-11-27: 30 mL via ORAL
  Filled 2014-11-27: qty 30

## 2014-11-27 NOTE — Progress Notes (Signed)
3 Days Post-Op  Subjective: Still sore, but improving No nausea No flatus yet  Objective: Vital signs in last 24 hours: Temp:  [98.2 F (36.8 C)-99.9 F (37.7 C)] 98.2 F (36.8 C) (05/15 0612) Pulse Rate:  [77-94] 77 (05/15 0612) Resp:  [15-30] 15 (05/15 0800) BP: (117-145)/(55-72) 145/70 mmHg (05/15 0612) SpO2:  [97 %-100 %] 100 % (05/15 0800) Last BM Date: 11/24/14  Intake/Output from previous day: 05/14 0701 - 05/15 0700 In: 2160 [P.O.:360; I.V.:1800] Out: 2400 [Urine:2400] Intake/Output this shift:    Abdomen soft with good bowel sounds Incision clean  Lab Results:   Recent Labs  11/25/14 0250 11/26/14 0626  WBC 10.7* 12.0*  HGB 12.2 11.8*  HCT 37.2 36.6  PLT 129* 117*   BMET  Recent Labs  11/25/14 0250 11/26/14 0626  NA 133* 135  K 4.6 4.5  CL 103 105  CO2 22 23  GLUCOSE 146* 112*  BUN 27* 16  CREATININE 1.41* 1.10*  CALCIUM 8.3* 8.6*   PT/INR No results for input(s): LABPROT, INR in the last 72 hours. ABG No results for input(s): PHART, HCO3 in the last 72 hours.  Invalid input(s): PCO2, PO2  Studies/Results: No results found.  Anti-infectives: Anti-infectives    Start     Dose/Rate Route Frequency Ordered Stop   11/24/14 1400  cefOXitin (MEFOXIN) 1 g in dextrose 5 % 50 mL IVPB     1 g 100 mL/hr over 30 Minutes Intravenous Every 6 hours 11/24/14 1246 11/25/14 0226   11/24/14 0700  cefOXitin (MEFOXIN) 2 g in dextrose 5 % 50 mL IVPB     2 g 100 mL/hr over 30 Minutes Intravenous To Surgery 11/23/14 1329 11/24/14 0753      Assessment/Plan: s/p Procedure(s): SIGMOID COLECTOMY AND COLSOTOMY CLOSURE (N/A)  Keep on liquids Out of bed.  LOS: 3 days    Traeton Bordas A 11/27/2014

## 2014-11-28 ENCOUNTER — Encounter (HOSPITAL_COMMUNITY): Payer: Self-pay | Admitting: Surgery

## 2014-11-28 LAB — CBC
HCT: 32.1 % — ABNORMAL LOW (ref 36.0–46.0)
Hemoglobin: 10.1 g/dL — ABNORMAL LOW (ref 12.0–15.0)
MCH: 28.4 pg (ref 26.0–34.0)
MCHC: 31.5 g/dL (ref 30.0–36.0)
MCV: 90.2 fL (ref 78.0–100.0)
Platelets: 134 10*3/uL — ABNORMAL LOW (ref 150–400)
RBC: 3.56 MIL/uL — ABNORMAL LOW (ref 3.87–5.11)
RDW: 13.8 % (ref 11.5–15.5)
WBC: 6.8 10*3/uL (ref 4.0–10.5)

## 2014-11-28 LAB — BASIC METABOLIC PANEL
Anion gap: 7 (ref 5–15)
BUN: 13 mg/dL (ref 6–20)
CO2: 25 mmol/L (ref 22–32)
Calcium: 8.5 mg/dL — ABNORMAL LOW (ref 8.9–10.3)
Chloride: 106 mmol/L (ref 101–111)
Creatinine, Ser: 0.93 mg/dL (ref 0.44–1.00)
GFR calc Af Amer: >60 mL/min (ref >60)
GFR calc non Af Amer: 56 mL/min — ABNORMAL LOW (ref >60)
Glucose, Bld: 110 mg/dL — ABNORMAL HIGH (ref 65–99)
Potassium: 4.7 mmol/L (ref 3.5–5.1)
Sodium: 138 mmol/L (ref 135–145)

## 2014-11-28 MED ORDER — OXYCODONE-ACETAMINOPHEN 5-325 MG PO TABS
1.0000 | ORAL_TABLET | ORAL | Status: DC | PRN
  Administered 2014-11-28 – 2014-11-29 (×5): 1 via ORAL
  Filled 2014-11-28 (×5): qty 1

## 2014-11-28 MED ORDER — KETOROLAC TROMETHAMINE 15 MG/ML IJ SOLN
15.0000 mg | Freq: Four times a day (QID) | INTRAMUSCULAR | Status: DC
  Administered 2014-11-28 – 2014-11-30 (×8): 15 mg via INTRAVENOUS
  Filled 2014-11-28 (×8): qty 1

## 2014-11-28 MED ORDER — MORPHINE SULFATE 2 MG/ML IJ SOLN
2.0000 mg | INTRAMUSCULAR | Status: DC | PRN
  Administered 2014-11-29: 2 mg via INTRAVENOUS
  Filled 2014-11-28: qty 1

## 2014-11-28 NOTE — Progress Notes (Signed)
Physical Therapy Treatment Patient Details Name: [name] MRN: [id number] DOB: 08-31-1929 Today's Date: 11/28/2014    History of Present Illness 81 yo female s/p SIGMOID COLECTOMY AND COLSOTOMY CLOSURE. PMH of DVT, COPD, CHF, depression, HLD and adenocarcinoma.    PT Comments    Patient progressing well with mobility. Improved ambulation distance today. Fatigues easily requiring frequent rest breaks. Tolerated there ex sitting in chair post ambulation bout. Highly motivated to return to PLOF. Continues to be appropriate for ST SNF to maximize independence and safety with mobility.    Follow Up Recommendations  SNF;Supervision for mobility/OOB     Equipment Recommendations  Other (comment) (TBD at next venue of care. )    Recommendations for Other Services       Precautions / Restrictions Precautions Precautions: Fall Precaution Comments: abdominal binder due to incision Restrictions Weight Bearing Restrictions: No    Mobility  Bed Mobility               General bed mobility comments: Sitting in chair upon PT arrival.   Transfers Overall transfer level: Needs assistance Equipment used: Rolling walker (2 wheeled) Transfers: Sit to/from Stand Sit to Stand: Min guard         General transfer comment: Min guard to rise from chair with increased time.   Ambulation/Gait Ambulation/Gait assistance: Min guard Ambulation Distance (Feet): 50 Feet Assistive device: Rolling walker (2 wheeled) Gait Pattern/deviations: Step-through pattern;Decreased stride length;Trunk flexed Gait velocity: decreased Gait velocity interpretation: Below normal speed for age/gender General Gait Details: Pt with very slow, guarded gait. No knee buckling or LOB. Mildly unsteady at times. Chair follow. Dyspnea present however vitals stable.   Stairs            Wheelchair Mobility    Modified Rankin (Stroke Patients Only)       Balance Overall balance assessment: Needs  assistance Sitting-balance support: Feet supported;No upper extremity supported Sitting balance-Leahy Scale: Fair     Standing balance support: During functional activity Standing balance-Leahy Scale: Poor Standing balance comment: Relient on RW.                     Cognition Arousal/Alertness: Awake/alert Behavior During Therapy: WFL for tasks assessed/performed Overall Cognitive Status: Within Functional Limits for tasks assessed                      Exercises General Exercises - Lower Extremity Ankle Circles/Pumps: Both;15 reps;Seated Long Arc Quad: Both;15 reps;Seated Hip ABduction/ADduction: Both;15 reps;Seated    General Comments        Pertinent Vitals/Pain Pain Assessment: Faces Pain Score: 2  Faces Pain Scale: Hurts little more Pain Location: abdomen Pain Descriptors / Indicators: Sore Pain Intervention(s): Monitored during session;Repositioned;Patient requesting pain meds-RN notified    Home Living                      Prior Function            PT Goals (current goals can now be found in the care plan section) Progress towards PT goals: Progressing toward goals    Frequency  Min 3X/week    PT Plan Current plan remains appropriate    Co-evaluation             End of Session Equipment Utilized During Treatment: Gait belt;Other (comment) Activity Tolerance: Patient tolerated treatment well Patient left: in chair;with call bell/phone within reach     Time: 1130-1153 PT Time Calculation (  min) (ACUTE ONLY): 23 min  Charges:  $Gait Training: 8-22 mins $Therapeutic Exercise: 8-22 mins                    G Codes:      Jevin Camino A Maud Rubendall 11/28/2014, 11:59 AM  Wray Kearns, [place], DPT [phone]

## 2014-11-28 NOTE — Progress Notes (Signed)
Per MD note patient will likely be ready for DC Wednesday- CSW informed [facility]- they will keep bed available for Wednesday.  CSW will continue to follow.  Domenica Reamer, [place] Social Worker [phone]

## 2014-11-28 NOTE — Care Management Note (Signed)
Case Management Note  Patient Details  Name: [name] MRN: [id number] Date of Birth: 17-Dec-1929  Subjective/Objective:                    Action/Plan: UR updated   Expected Discharge Date: 11-30-14                Expected Discharge Plan:  Skilled Nursing Facility  In-House Referral:  Clinical Social Work  Discharge planning Services     Post Acute Care Choice:    Choice offered to:     DME Arranged:    DME Agency:     HH Arranged:    [place] Agency:     Status of Service:  In process, will continue to follow  Medicare Important Message Given:  Yes Date Medicare IM Given:  11/28/14 Medicare IM give by:  Magdalen Spatz RN BSN  Date Additional Medicare IM Given:    Additional Medicare Important Message give by:     If discussed at [place] of Stay Meetings, dates discussed:    Additional Comments:  Marilu Favre, RN 11/28/2014, 11:33 AM

## 2014-11-28 NOTE — Progress Notes (Signed)
Initial Nutrition Assessment  DOCUMENTATION CODES:  Obesity unspecified  INTERVENTION:  Boost B[place]ze  NUTRITION DIAGNOSIS:  Inadequate oral intake related to altered GI function as evidenced by meal completion < 25%.   GOAL:  Patient will meet greater than or equal to 90% of their needs   MONITOR:  PO intake, Supplement acceptance, Diet advancement, Labs, Weight trends, Skin, I & O's  REASON FOR ASSESSMENT:  Malnutrition Screening Tool    ASSESSMENT: The patient is a 81 year old female who presents with colorectal cancer s/p right hemicolectomy with primary anastomosis on 06/02/14 for an obstructing tumor of the ileocecal valve. She also underwent descending colostomy for rectovaginal fistula. Her pathology report showed two separate tumors with positive lymph nodes. She was evaluated by Oncology during hospitalization and the decision was made to not proceed with any further therapy. She was discharged to rehab on 06/10/14. She reports that her appetite is improving and that her ostomy continues to function. Her incision is completely healed. Last week, she underwent a vaginal and anal examination under anesthesia by Dr. [name]. There were no signs of any remaining colovaginal fistula. However, her sigmoid colon was significantly scarred and strictured. A recent CT scan showed extensive diverticulosis of the sigmoid colon. there is concern that if we just closed her colostomy, she could have future problems with sigmoid stricture or even recurrent diverticulitis. She is here today to discuss these recent findings and to plan her next surgery. She is eager to get rid of her colostomy.  s/p Procedure(s) on 11/24/14: [place] (N/A)  Pt reports that her appetite has been slowly improving since her surgery in November 2015. She endorses weight loss prior to November 2015 (UBW: 190#), but admits her weight has been stable over the past 5  months, which is consistent with wt hx. Noted a 9.1% wt loss x 6 months. She reports that she is happy to be advanced to full liquids, but reveals that ice cream made her stomach hurt this morning (she usually able to tolerate dairy without difficulty). Meal completion 10-25%. She is consuming [place] and is amenable to continue supplement. She reports she consumes at least one EAS protein shakes at home and enjoys that vanilla flavor. She does not like Ensure or Boost.   Nutrition-focused physical exam reveals mild to moderate depletion of the temple, orbital, and upper arm areas. Pt reports she has been less active in the past 6 months, due to decline in mobility and weakness.   Educated pt on importance of good meal and supplement intake to promote healing.   CSW following. Plan is to discharge to [facility] once medically stable.  Labs reviewed. Calcium: 8.5, Glucose: 110.   Height:  Ht Readings from Last 1 Encounters:  11/27/14 5\' 3"  (1.6 m)    Weight:  Wt Readings from Last 1 Encounters:  11/27/14 169 lb 12.8 oz (77.02 kg)    Ideal Body Weight:  52 kg  Wt Readings from Last 10 Encounters:  11/27/14 169 lb 12.8 oz (77.02 kg)  11/22/14 169 lb 12.8 oz (77.021 kg)  11/15/14 171 lb (77.565 kg)  10/25/14 167 lb 1.6 oz (75.796 kg)  10/13/14 167 lb 12.8 oz (76.114 kg)  09/02/14 168 lb (76.204 kg)  08/25/14 165 lb (74.844 kg)  07/26/14 166 lb 8 oz (75.524 kg)  07/18/14 166 lb (75.297 kg)  06/29/14 173 lb (78.472 kg)    BMI:  Body mass index is 30.09 kg/(m^2).  Obesity, class I  Estimated Nutritional Needs:  Kcal:  1600-1800  Protein:  80-90 grams  Fluid:  1.6-1.8 L  Skin:  Wound (see comment) (closed abdominal incision)  Diet Order:  Diet full liquid Room service appropriate?: Yes; Fluid consistency:: Thin  EDUCATION NEEDS:  Education needs addressed   Intake/Output Summary (Last 24 hours) at 11/28/14 1137 Last data filed at 11/28/14 1046   Gross per 24 hour  Intake 2366.25 ml  Output    550 ml  Net 1816.25 ml    Last BM:  11/28/14  Carleah Yablonski A. Jimmye Norman, RD, LDN, CDE Pager: [phone] After hours Pager: [phone]

## 2014-11-28 NOTE — Progress Notes (Signed)
Occupational Therapy Treatment Patient Details Name: [name] MRN: [id number] DOB: Jul 28, 1929 Today's Date: 11/28/2014    History of present illness 81 yo female s/p SIGMOID COLECTOMY AND COLSOTOMY CLOSURE   OT comments  This 81 yo female making progress since eval. She will continue to benefit from acute OT with follow up OT at SNF to get to a Mod I level to return home alone with her dog.  Follow Up Recommendations  SNF;Supervision/Assistance - 24 hour    Equipment Recommendations   (TBD at next venue)       Precautions / Restrictions Precautions Precautions: Fall Precaution Comments: abdominal binder due to incision Restrictions Weight Bearing Restrictions: No       Mobility Bed Mobility               General bed mobility comments: Pt up in recliner upon arrival  Transfers Overall transfer level: Needs assistance Equipment used: Rolling walker (2 wheeled) Transfers: Sit to/from Stand Sit to Stand: Min guard                  ADL Overall ADL's : Needs assistance/impaired     Grooming: Wash/dry hands;Oral care;Min Dispensing optician: Min guard;Ambulation;RW;BSC (over toilet)   Toileting- Clothing Manipulation and Hygiene: Minimal assistance;Sit to/from stand                Vision                 Additional Comments: No change from baseline          Cognition   Behavior During Therapy: WFL for tasks assessed/performed Overall Cognitive Status: Within Functional Limits for tasks assessed                                    Pertinent Vitals/ Pain       Pain Assessment: 0-10 Pain Score: 2  Pain Location: abdomen Pain Descriptors / Indicators: Sore Pain Intervention(s): Monitored during session;Repositioned         Frequency Min 2X/week     Progress Toward Goals  OT Goals(current goals can now be found in the care plan section)  Progress towards OT goals: Progressing  toward goals     Plan Discharge plan remains appropriate       End of Session Equipment Utilized During Treatment: Rolling walker (abdominal binder)   Activity Tolerance Patient tolerated treatment well   Patient Left in chair;with call bell/phone within reach   Nurse Communication  (NT: pt urinated 200 )        Time: 6283-1517 OT Time Calculation (min): 31 min  Charges: OT General Charges $OT Visit: 1 Procedure OT Treatments $Self Care/Home Management : 23-37 mins  Almon Register [phone] 11/28/2014, 10:46 AM

## 2014-11-28 NOTE — Progress Notes (Signed)
4 Days Post-Op  Subjective: Patient had a small bowel movement this morning Passing some flatus, but burping a lot Pain still marginally well controlled Appreciate PT/ OT evaluations and CSW assistance in SNF placement  Objective: Vital signs in last 24 hours: Temp:  [98.7 F (37.1 C)-99.5 F (37.5 C)] 99.5 F (37.5 C) (05/16 0631) Pulse Rate:  [77-112] 90 (05/16 0631) Resp:  [12-28] 25 (05/16 0631) BP: (112-141)/(54-71) 141/71 mmHg (05/16 0631) SpO2:  [97 %-100 %] 98 % (05/16 0631) Weight:  [77.02 kg (169 lb 12.8 oz)] 77.02 kg (169 lb 12.8 oz) (05/15 1100) Last BM Date: 11/24/14  Intake/Output from previous day: 05/15 0701 - 05/16 0700 In: 2486.3 [P.O.:720; I.V.:1766.3] Out: -  Intake/Output this shift:    General appearance: alert, cooperative and no distress GI: soft, tender around incisions Both incisions c/d/i through honeycomb dressing  Lab Results:   Recent Labs  11/26/14 0626  WBC 12.0*  HGB 11.8*  HCT 36.6  PLT 117*   BMET  Recent Labs  11/26/14 0626  NA 135  K 4.5  CL 105  CO2 23  GLUCOSE 112*  BUN 16  CREATININE 1.10*  CALCIUM 8.6*   PT/INR No results for input(s): LABPROT, INR in the last 72 hours. ABG No results for input(s): PHART, HCO3 in the last 72 hours.  Invalid input(s): PCO2, PO2  Studies/Results: No results found.  Anti-infectives: Anti-infectives    Start     Dose/Rate Route Frequency Ordered Stop   11/24/14 1400  cefOXitin (MEFOXIN) 1 g in dextrose 5 % 50 mL IVPB     1 g 100 mL/hr over 30 Minutes Intravenous Every 6 hours 11/24/14 1246 11/25/14 0226   11/24/14 0700  cefOXitin (MEFOXIN) 2 g in dextrose 5 % 50 mL IVPB     2 g 100 mL/hr over 30 Minutes Intravenous To Surgery 11/23/14 1329 11/24/14 0753      Assessment/Plan: s/p Procedure(s): SIGMOID COLECTOMY AND COLSOTOMY CLOSURE (N/A) Saline lock IV fluids  Recheck labs Add toradol PO pain meds - d/c PCA SNF placement  [facility] - probable discharge  Wednesday  LOS: 4 days    Macklin Jacquin K. 11/28/2014

## 2014-11-29 MED ORDER — OXYCODONE HCL 5 MG PO TABS
10.0000 mg | ORAL_TABLET | ORAL | Status: DC | PRN
  Administered 2014-11-29 – 2014-11-30 (×5): 10 mg via ORAL
  Filled 2014-11-29 (×6): qty 2

## 2014-11-29 NOTE — Care Management Note (Signed)
Case Management Note  Patient Details  Name: [name] MRN: [id number] Date of Birth: August 06, 1929  Subjective/Objective:                    Action/Plan:   Expected Discharge Date:  12/01/14               Expected Discharge Plan:  Skilled Nursing Facility  In-House Referral:  Clinical Social Work  Discharge planning Services     Post Acute Care Choice:    Choice offered to:     DME Arranged:    DME Agency:     HH Arranged:    [place] Agency:     Status of Service:  In process, will continue to follow  Medicare Important Message Given:  Yes Date Medicare IM Given:  11/28/14 Medicare IM give by:  Magdalen Spatz RN BSN  Date Additional Medicare IM Given:    Additional Medicare Important Message give by:     If discussed at [place] of Stay Meetings, dates discussed:  11-29-14  Additional Comments:  Marilu Favre, RN 11/29/2014, 8:08 AM

## 2014-11-29 NOTE — Progress Notes (Signed)
Patient complaining of diffuse abdominal pain. Hyperactive gassy bowel sounds auscultated. Patient states she's had one small BM today. Mylanta/Mylanta given along with tylenol and Oxy IR. Patient able to ambulate with walker and minimal assistance to the bathroom to try and have a BM. Patient burped multiple times on the way to the bathroom.  Will continue to monitor.  Roselyn Reef Irini Leet,RN

## 2014-11-29 NOTE — Progress Notes (Addendum)
5 Days Post-Op  Subjective: Had three more bowel movements yesterday Tolerating diet Toradol has helped with the pain - using mostly PO Percocet with occasional breakthrough IV Morphine  Objective: Vital signs in last 24 hours: Temp:  [98.3 F (36.8 C)-98.5 F (36.9 C)] 98.3 F (36.8 C) (05/17 0545) Pulse Rate:  [74-80] 74 (05/17 0545) Resp:  [12-15] 15 (05/17 0545) BP: (100-152)/(56-77) 152/77 mmHg (05/17 0545) SpO2:  [96 %-99 %] 99 % (05/17 0545) Last BM Date: 11/28/14  Intake/Output from previous day: 05/16 0701 - 05/17 0700 In: 600 [P.O.:600] Out: 950 [Urine:950] Intake/Output this shift: Total I/O In: 240 [P.O.:240] Out: 150 [Urine:150]  General appearance: alert, cooperative and no distress Resp: clear to auscultation bilaterally Cardio: regular rate and rhythm, S1, S2 normal, no murmur, click, rub or gallop GI: soft, + BS, incisional tenderness Incisions c/d/i  Lab Results:   Recent Labs  11/28/14 0837  WBC 6.8  HGB 10.1*  HCT 32.1*  PLT 134*   BMET  Recent Labs  11/28/14 0837  NA 138  K 4.7  CL 106  CO2 25  GLUCOSE 110*  BUN 13  CREATININE 0.93  CALCIUM 8.5*   PT/INR No results for input(s): LABPROT, INR in the last 72 hours. ABG No results for input(s): PHART, HCO3 in the last 72 hours.  Invalid input(s): PCO2, PO2  Studies/Results: No results found.  Anti-infectives: Anti-infectives    Start     Dose/Rate Route Frequency Ordered Stop   11/24/14 1400  cefOXitin (MEFOXIN) 1 g in dextrose 5 % 50 mL IVPB     1 g 100 mL/hr over 30 Minutes Intravenous Every 6 hours 11/24/14 1246 11/25/14 0226   11/24/14 0700  cefOXitin (MEFOXIN) 2 g in dextrose 5 % 50 mL IVPB     2 g 100 mL/hr over 30 Minutes Intravenous To Surgery 11/23/14 1329 11/24/14 0753      Assessment/Plan: s/p Procedure(s): SIGMOID COLECTOMY AND COLSOTOMY CLOSURE (N/A) Plan for discharge tomorrow Advance diet  PO Pain meds only - will increase amount of PO pain meds    LOS: 5 days    Danahi Reddish K. 11/29/2014

## 2014-11-30 MED ORDER — OXYCODONE HCL 5 MG PO TABS: 5.0000 mg | ORAL_TABLET | ORAL | Status: DC | PRN

## 2014-11-30 NOTE — Clinical Social Work Note (Addendum)
Patient to be d/c'ed today to [facility].  Patient and family agreeable to plans will transport via patient's daughter's personal vehicle RN to call report.  Evette Cristal, MSW, [place]

## 2014-11-30 NOTE — Discharge Instructions (Signed)
[facility]      [facility] [phone]  OPEN ABDOMINAL SURGERY: POST OP INSTRUCTIONS  Always review your discharge instruction sheet given to you by the facility where your surgery was performed.  IF YOU HAVE DISABILITY OR FAMILY LEAVE FORMS, YOU MUST BRING THEM TO THE OFFICE FOR PROCESSING.  PLEASE DO NOT GIVE THEM TO YOUR DOCTOR.  1. A prescription for pain medication Dougal be given to you upon discharge.  Take your pain medication as prescribed, if needed.  If narcotic pain medicine is not needed, then you Hassell take acetaminophen (Tylenol) or ibuprofen (Advil) as needed. 2. Take your usually prescribed medications unless otherwise directed. 3. If you need a refill on your pain medication, please contact your pharmacy. They will contact our office to request authorization.  Prescriptions will not be filled after 5pm or on week-ends. 4. You should follow a light diet the first few days after arrival home, such as soup and crackers, pudding, etc.unless your doctor has advised otherwise. A high-fiber, low fat diet can be resumed as tolerated.   Be sure to include lots of fluids daily. Most patients will experience some swelling and bruising on the chest and neck area.  Ice packs will help.  Swelling and bruising can take several days to resolve 5. Most patients will experience some swelling and bruising in the area of the incision. Ice pack will help. Swelling and bruising can take several days to resolve..  6. It is common to experience some constipation if taking pain medication after surgery.  Increasing fluid intake and taking a stool softener will usually help or prevent this problem from occurring.  A mild laxative (Milk of Magnesia or Miralax) should be taken according to package directions if there are no bowel movements after 48 hours. 7.  You Canan have steri-strips (small skin tapes) in place directly over the incision.  These strips should be left on the skin for 7-10 days.  If your  surgeon used skin glue on the incision, you [place] shower in 24 hours.  The glue will flake off over the next 2-3 weeks.  Any sutures or staples will be removed at the office during your follow-up visit. You Currey find that a light gauze bandage over your incision Staver keep your staples from being rubbed or pulled. You Antonopoulos shower and replace the bandage daily. 8. ACTIVITIES:  You Roher resume regular (light) daily activities beginning the next day--such as daily self-care, walking, climbing stairs--gradually increasing activities as tolerated.  You Gudger have sexual intercourse when it is comfortable.  Refrain from any heavy lifting or straining until approved by your doctor. a. You [place] drive when you no longer are taking prescription pain medication, you can comfortably wear a seatbelt, and you can safely maneuver your car and apply brakes b. Return to Work: ___________________________________ 9. You should see your doctor in the office for a follow-up appointment approximately two weeks after your surgery.  Make sure that you call for this appointment within a day or two after you arrive home to insure a convenient appointment time. OTHER INSTRUCTIONS:  _____________________________________________________________ _____________________________________________________________  WHEN TO CALL YOUR DOCTOR: 1. Fever over 101.0 2. Inability to urinate 3. Nausea and/or vomiting 4. Extreme swelling or bruising 5. Continued bleeding from incision. 6. Increased pain, redness, or drainage from the incision. 7. Difficulty swallowing or breathing 8. Muscle cramping or spasms. 9. Numbness or tingling in hands or feet or around lips.  The clinic staff is available to   answer your questions during regular business hours.  Please don't hesitate to call and ask to speak to one of the nurses if you have concerns.  For further questions, please visit [email or web address]   

## 2014-11-30 NOTE — Progress Notes (Signed)
Report was given to [place] the nurse at [facility], [facility], [facility].

## 2014-11-30 NOTE — Discharge Summary (Signed)
Physician Discharge Summary  Patient ID: [name] MRN: [id number] DOB/AGE: [birth year] 79 y.o.  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  She slowly regained bowel function over the next three days.  She had issues with pain control but we were able to control her pain with PO Oxycodone.  Physical therapy and occupational therapy recommended temporary SNF placement.  This has been arranged.  Consults: PT/ OT  Significant Diagnostic Studies: Pathology - diverticular disease; no malignancy  Treatments: surgery: as above  Discharge Exam: Blood pressure 137/69, pulse 76, temperature 98.9 F (37.2 C), temperature source Oral, resp. rate 16, height 5\' 3"  (1.6 m), weight 77.02 kg (169 lb 12.8 oz), SpO2 96 %. General appearance: alert, cooperative and no distress Resp: clear to auscultation bilaterally Cardio: regular rate and rhythm, S1, S2 normal, no murmur, click, rub or gallop GI: soft, incisional tenderness; Midline and LLQ incisions c/d/i  Disposition: To [facility]  Discharge Instructions    Call MD for:  persistant nausea and vomiting    Complete by:  As directed      Call MD for:  redness, tenderness, or signs of infection (pain, swelling, redness, odor or green/yellow discharge around incision site)    Complete by:  As directed      Call MD for:  severe uncontrolled pain    Complete by:  As directed      Call MD for:  temperature >100.4    Complete by:  As directed      Diet general    Complete by:  As directed      Discharge wound care:    Complete by:  As directed   Dry gauze over staple lines if needed.     Driving Restrictions    Complete by:  As directed   Do not drive while taking pain  medications     Increase activity slowly    Complete by:  As directed      [place] shower / Bathe    Complete by:  As directed      [place] walk up steps    Complete by:  As directed             Medication List    TAKE these medications        acetaminophen 325 MG tablet  Commonly known as:  TYLENOL  Take 2 tablets (650 mg total) by mouth every 6 (six) hours as needed for mild pain (or Fever >/= 101).     benazepril 40 MG tablet  Commonly known as:  LOTENSIN  Take 1 tablet by mouth daily. Takes 2 times a week     cholecalciferol 1000 UNITS tablet  Commonly known as:  VITAMIN D  Take 1,000 Units by mouth daily.     esomeprazole 20 MG capsule  Commonly known as:  NEXIUM  Take 20 mg by mouth daily at 12 noon. otc     ferrous sulfate 325 (65 FE) MG tablet  Take 1 tablet (325 mg total) by mouth 2 (two) times daily with a meal.     gabapentin 600 MG tablet  Commonly known as:  NEURONTIN  Take 600 mg by mouth daily.     HYDROcodone-acetaminophen 5-325 MG per tablet  Commonly known as:  NORCO  Take 1-2  tablets by mouth every 6 (six) hours as needed for moderate pain.     hydroxypropyl methylcellulose / hypromellose 2.5 % ophthalmic solution  Commonly known as:  ISOPTO TEARS / GONIOVISC  Place 1 drop into both eyes as needed for dry eyes.     LORazepam 0.5 MG tablet  Commonly known as:  ATIVAN  Take 1 tablet (0.5 mg total) by mouth 2 (two) times daily as needed for anxiety or sleep.     metoprolol 50 MG tablet  Commonly known as:  LOPRESSOR  Take 50 mg by mouth 2 (two) times daily.     multivitamin with minerals tablet  Take 1 tablet by mouth daily.     nitroGLYCERIN 0.4 MG SL tablet  Commonly known as:  NITROSTAT  Place 0.4 mg under the tongue every 5 (five) minutes as needed for chest pain.     oxyCODONE 5 MG immediate release tablet  Commonly known as:  Oxy IR/ROXICODONE  Take 1-2 tablets (5-10 mg total) by mouth every 4 (four) hours as needed for moderate pain,  severe pain or breakthrough pain.     promethazine 12.5 MG tablet  Commonly known as:  PHENERGAN  Take 1 tablet by mouth every 6 (six) hours as needed. Nausea/vomitting     sertraline 50 MG tablet  Commonly known as:  ZOLOFT  TAKE 1 TABLET EVERY DAY     simvastatin 40 MG tablet  Commonly known as:  ZOCOR  Take 1 tablet (40 mg total) by mouth at bedtime.     traMADol 50 MG tablet  Commonly known as:  ULTRAM  Take 1 tablet (50 mg total) by mouth every 8 (eight) hours.     traZODone 50 MG tablet  Commonly known as:  DESYREL  Take 50 mg by mouth at bedtime.     triamcinolone cream 0.1 %  Commonly known as:  KENALOG  Apply 1 application topically 2 (two) times daily as needed (Eczema on legs).     Vitamin C 500 MG Caps  Take 1 tablet by mouth daily.           Follow-up Information    Follow up with Maia Petties., MD. Schedule an appointment as soon as possible for a visit in 1 week.   Specialty:  General Surgery   Why:  For suture removal   Contact information:   [address] [address] [phone]       Signed: Maia Petties. 11/30/2014, 7:53 AM

## 2014-12-01 ENCOUNTER — Encounter: Payer: Self-pay | Admitting: Registered Nurse

## 2014-12-01 ENCOUNTER — Non-Acute Institutional Stay: Payer: Commercial Managed Care - HMO | Admitting: Registered Nurse

## 2014-12-01 DIAGNOSIS — I5032 Chronic diastolic (congestive) heart failure: Secondary | ICD-10-CM

## 2014-12-01 DIAGNOSIS — F418 Other specified anxiety disorders: Secondary | ICD-10-CM

## 2014-12-01 DIAGNOSIS — Z9889 Other specified postprocedural states: Secondary | ICD-10-CM

## 2014-12-01 DIAGNOSIS — R5381 Other malaise: Secondary | ICD-10-CM | POA: Diagnosis not present

## 2014-12-01 DIAGNOSIS — K219 Gastro-esophageal reflux disease without esophagitis: Secondary | ICD-10-CM | POA: Diagnosis not present

## 2014-12-01 DIAGNOSIS — D62 Acute posthemorrhagic anemia: Secondary | ICD-10-CM

## 2014-12-01 DIAGNOSIS — F32A Depression, unspecified: Secondary | ICD-10-CM

## 2014-12-01 DIAGNOSIS — I10 Essential (primary) hypertension: Secondary | ICD-10-CM

## 2014-12-01 DIAGNOSIS — G4700 Insomnia, unspecified: Secondary | ICD-10-CM

## 2014-12-01 DIAGNOSIS — E785 Hyperlipidemia, unspecified: Secondary | ICD-10-CM | POA: Diagnosis not present

## 2014-12-01 DIAGNOSIS — F419 Anxiety disorder, unspecified: Secondary | ICD-10-CM

## 2014-12-01 DIAGNOSIS — Z9049 Acquired absence of other specified parts of digestive tract: Secondary | ICD-10-CM

## 2014-12-01 DIAGNOSIS — F329 Major depressive disorder, single episode, unspecified: Secondary | ICD-10-CM

## 2014-12-01 NOTE — Progress Notes (Signed)
Patient ID: [name], female   DOB: 1929-07-30, 81 y.o.   MRN: [id number]   Place of Service: [facility]  No Active Allergies  Code Status: Full Code  Goals of Care: Longevity/STR  Chief Complaint  Patient presents with  . Hospitalization Follow-up    HPI Reviewed of hospital record showed 81 y.o. female with PMH of CAD, HTN, HLD, CHF, chronic pain,  diverticulosis, colon polyps, DJD among others is being seen for a follow-up visit post hospital admission from 11/23/13 to 11/30/14 with s/p sigmoid colectomy with colostomy reversal. About 6 months ago, patient underwent urgent right hemicolectomy for obstructing colon cancer. At that time she had also been diagnosed with a colovaginal fistula from recurrent diverticulitis and also underwent descending colostomy to divert stool from her distal colon to allow the colovaginal fistula to heal. Fistula is no longer evident per recent OP note but, the sigmoid colon appears quite strictured and tortuous-sigmoid colectomy along with colostomy reversal were recommended. She is here now for short term rehab and her goal is to return home. Seen in room today. Reported having abdominal pain from bowel surgery and gas. Rates pain 7-8/10. Describes pain as "just pure pain". States pain med provides some relief but does not eliminate pain. Movement makes pain worse. Denies any other concerns.   Review of Systems Constitutional: Negative for fever, chills, and fatigue. HENT: Negative for ear pain and congestion Eyes: Negative for eye pain and eye discharge Cardiovascular: Negative for chest pain, palpitations, and leg swelling Respiratory: Negative cough, shortness of breath, and wheezing.  Gastrointestinal: Negative for nausea and vomiting. See HPI Genitourinary: Negative for  dysuria Musculoskeletal: Negative for uncontrolled pain Neurological: Negative for dizziness and headache Skin: Negative for rash and wound.   Psychiatric: Negative  for depression   Past Medical History  Diagnosis Date  . CAD (coronary artery disease)     BMS x2 to RCA 10/2007  . Essential hypertension   . Diastolic CHF, chronic   . History of TIA (transient ischemic attack)   . Hyperlipidemia   . Depression   . COPD with chronic bronchitis   . GERD (gastroesophageal reflux disease)   . History of hiatal hernia   . History of DVT of lower extremity   . Sigmoid diverticulosis   . Colovaginal fistula   . History of adenomatous polyp of colon   . Renal insufficiency   . OA (osteoarthritis)   . DJD (degenerative joint disease)   . Wears glasses   . Wears dentures   . Colostomy in place   . Peripheral neuropathy   . Adenocarcinoma, colon Oncologist-- Dr Truitt Merle    Multifocal (2) Colon cancer @ ileocecal valve and ascending ( mT4N1Mx), Stage IIIB, grade I, MMR normal , 2 of 16 +lymph nodes, negative surgical margins---  06-02-2014  Right hemicolectomy w/ colostomy  . Chronic anemia   . History of blood transfusion     Past Surgical History  Procedure Laterality Date  . Appendectomy    . Cholecystectomy    . Abdominal hysterectomy    . Lumbar laminectomy  10-29-2002,  1969    Left  L3 -- 4  decompression  . Total knee arthroplasty Bilateral left 1994/  right 2000  . Rotator cuff repair Bilateral   . Cataract extraction w/ intraocular lens  implant, bilateral Bilateral   . Tonsillectomy    . Dilation and curettage of uterus    . Patellectomy Right 09/14/2013    Procedure:  PATELLECTOMY;  Surgeon: Meredith Pel, MD;  Location: [place];  Service: Orthopedics;  Laterality: Right;  . Partial colectomy N/A 06/02/2014    Procedure: RIGHT PARTIAL COLECTOMY ;  Surgeon: Donnie Mesa, MD;  Location: [place];  Service: General;  Laterality: N/A;  . Colostomy N/A 06/02/2014    Procedure: DIVERTING DESCENDING END COLOSTOMY;  Surgeon: Donnie Mesa, MD;  Location: [place];  Service: General;  Laterality: N/A;  . Revision total knee arthroplasty Bilateral  right  04-02-2011/  left 1996 & 10-05-1999  . Anterior cervical decomp/discectomy fusion  01-31-2009    C5 -- 7  . Cardiovascular stress test  02-26-2011    Normal lexiscan no exercise study/  no ischemia/  normal LV function and wall motion, ef 67%  . Transthoracic echocardiogram  12-01-2009    Grade I diastolic dysfunction/  ef 60%/  moderate MR/  mild TR  . Evaluation under anesthesia with anal fistulectomy N/A 10/13/2014    Procedure: ANAL EXAM UNDER ANESTHESIA ;  Surgeon: [name], MD;  Location: [facility];  Service: General;  Laterality: N/A;  . Proctoscopy N/A 10/13/2014    Procedure: RIDGE PROCTOSCOPY;  Surgeon: [name], MD;  Location: [facility];  Service: General;  Laterality: N/A;  . Coronary angioplasty with stent placement  11-04-2007  dr bensimhon    BMS x2 to RCA/  mild Non-obstructive disease LAD/  normal LVF  . Bone spurs Bilateral     Feet  . Hand surgery      Tendon repair  . Carpal tunnel release Right   . Laparoscopic sigmoid colectomy N/A 11/24/2014    Procedure: SIGMOID COLECTOMY AND COLSOTOMY CLOSURE;  Surgeon: Donnie Mesa, MD;  Location: [place];  Service: General;  Laterality: N/A;    History  Substance Use Topics  . Smoking status: Former Smoker -- 0.50 packs/day for 15 years    Types: Cigarettes    Quit date: 07/16/1967  . Smokeless tobacco: Never Used  . Alcohol Use: No    Family History  Problem Relation Age of Onset  . Osteoarthritis Mother   . Heart failure Mother   . Cancer Sister 75    ? colon cancer   . Cancer Brother 30    Kidney cancer   . Cancer Maternal Uncle 70    Colon cancer       Medication List       This list is accurate as of: 12/01/14  4:07 PM.  Always use your most recent med list.               acetaminophen 325 MG tablet  Commonly known as:  TYLENOL  Take 2 tablets (650 mg total) by mouth every 6 (six) hours as needed for mild pain (or Fever >/= 101).     alum & mag hydroxide-simeth 200-200-20 MG/5ML suspension   Commonly known as:  MAALOX/MYLANTA  Take 30 mLs by mouth every 6 (six) hours as needed for indigestion, heartburn or flatulence.     benazepril 40 MG tablet  Commonly known as:  LOTENSIN  Take 1 tablet by mouth daily. Takes 2 times a week     cholecalciferol 1000 UNITS tablet  Commonly known as:  VITAMIN D  Take 1,000 Units by mouth daily.     esomeprazole 20 MG capsule  Commonly known as:  NEXIUM  Take 20 mg by mouth daily at 12 noon. otc     ferrous sulfate 325 (65 FE) MG tablet  Take 1 tablet (325 mg  total) by mouth 2 (two) times daily with a meal.     gabapentin 600 MG tablet  Commonly known as:  NEURONTIN  Take 600 mg by mouth daily.     hydroxypropyl methylcellulose / hypromellose 2.5 % ophthalmic solution  Commonly known as:  ISOPTO TEARS / GONIOVISC  Place 1 drop into both eyes as needed for dry eyes.     LORazepam 0.5 MG tablet  Commonly known as:  ATIVAN  Take 1 tablet (0.5 mg total) by mouth 2 (two) times daily as needed for anxiety or sleep.     metoprolol 50 MG tablet  Commonly known as:  LOPRESSOR  Take 50 mg by mouth 2 (two) times daily.     multivitamin with minerals tablet  Take 1 tablet by mouth daily.     nitroGLYCERIN 0.4 MG SL tablet  Commonly known as:  NITROSTAT  Place 0.4 mg under the tongue every 5 (five) minutes as needed for chest pain.     oxyCODONE 5 MG immediate release tablet  Commonly known as:  Oxy IR/ROXICODONE  Take 1-2 tablets (5-10 mg total) by mouth every 4 (four) hours as needed for moderate pain, severe pain or breakthrough pain.     promethazine 12.5 MG tablet  Commonly known as:  PHENERGAN  Take 1 tablet by mouth every 6 (six) hours as needed. Nausea/vomitting     sertraline 50 MG tablet  Commonly known as:  ZOLOFT  TAKE 1 TABLET EVERY DAY     simvastatin 40 MG tablet  Commonly known as:  ZOCOR  Take 1 tablet (40 mg total) by mouth at bedtime.     traMADol 50 MG tablet  Commonly known as:  ULTRAM  Take 1 tablet (50  mg total) by mouth every 8 (eight) hours.     traZODone 50 MG tablet  Commonly known as:  DESYREL  Take 50 mg by mouth at bedtime.     triamcinolone cream 0.1 %  Commonly known as:  KENALOG  Apply 1 application topically 2 (two) times daily as needed (Eczema on legs).     Vitamin C 500 MG Caps  Take 1 tablet by mouth daily.        Physical Exam  BP 138/88 mmHg  Pulse 93  Temp(Src) 98.9 F (37.2 C)  Resp 16  Ht $R'5\' 3"'hk$  (1.6 m)  Wt 179 lb (81.194 kg)  BMI 31.72 kg/m2  SpO2 98%  Constitutional: WDWN elderly female in no acute distress. Conversant and pleasant HEENT: Normocephalic and atraumatic. PERRL. EOM intact. No icterus. Oral mucosa moist. Posterior pharynx clear of any exudate or lesions.  Neck: Supple and nontender. No lymphadenopathy, masses, or thyromegaly. No JVD or carotid bruits. Cardiac: Normal S1, S2. RRR without appreciable murmurs, rubs, or gallops. Distal pulses intact. Trace dependent edema.  Respiratory: Unlabored respiration. Breath sounds clear bilaterally without rales, rhonchi, or wheezes. GI: Audible bowel sounds in all quadrants. Soft, tender to palpation but nondistended. Surgical incision to mid abdomen and LLQ with staples. NO signs of infections noted.  Musculoskeletal: able to move all extremities. Generalized weakness.   Skin: Warm and dry.  Neurological: Alert and oriented to person, place, and time Psychiatric: Judgment and insight adequate. Appropriate mood and affect.   Labs Reviewed  CBC Latest Ref Rng 11/28/2014 11/26/2014 11/25/2014  WBC 4.0 - 10.5 K/uL 6.8 12.0(H) 10.7(H)  Hemoglobin 12.0 - 15.0 g/dL 10.1(L) 11.8(L) 12.2  Hematocrit 36.0 - 46.0 % 32.1(L) 36.6 37.2  Platelets 150 - 400 K/uL  134(L) 117(L) 129(L)    CMP Latest Ref Rng 11/28/2014 11/26/2014 11/25/2014  Glucose 65 - 99 mg/dL 110(H) 112(H) 146(H)  BUN 6 - 20 mg/dL 13 16 27(H)  Creatinine 0.44 - 1.00 mg/dL 0.93 1.10(H) 1.41(H)  Sodium 135 - 145 mmol/L 138 135 133(L)  Potassium  3.5 - 5.1 mmol/L 4.7 4.5 4.6  Chloride 101 - 111 mmol/L 106 105 103  CO2 22 - 32 mmol/L 25 23 22   Calcium 8.9 - 10.3 mg/dL 8.5(L) 8.6(L) 8.3(L)  Total Protein 6.4 - 8.3 g/dL - - -  Total Bilirubin 0.20 - 1.20 mg/dL - - -  Alkaline Phos 40 - 150 U/L - - -  AST 5 - 34 U/L - - -  ALT 0 - 55 U/L - - -    Lab Results  Component Value Date   HGBA1C 5.9* 05/31/2014    Lab Results  Component Value Date   TSH 4.160 05/31/2014    Lipid Panel     Component Value Date/Time   CHOL 130 01/19/2013 1014   TRIG 87.0 01/19/2013 1014   HDL 33.60* 01/19/2013 1014   CHOLHDL 4 01/19/2013 1014   VLDL 17.4 01/19/2013 1014   [place] 79 01/19/2013 1014   Diagnostic Studies Reviewed 11/16/14: EKG: sinus bradycardia with marked sinus arrhythmia  11/24/14: Surgical pathology Colon, segmental resection, Sigmoid -Diverticular disease, extending to the distal resection margin. -There is no evidence of malignancy  Assessment & Plan 1. DIASTOLIC HEART FAILURE, CHRONIC Appears euvolemic on exam. EF 60% per Echo in 5/11. Continue acei and bb with daily weight and monitor  2. Gastroesophageal reflux disease, esophagitis presence not specified Continue nexium 20mg  daily  3. Anxiety and depression Continue zoloft 50mg  with ativan 0.5mg  twice daily as needed for anxiety. Monitor for change in mood  4. Essential hypertension Continue benzepril 40mg  daily with lopressor 50mg  twice daily. Monitor bp readings  5. HLD (hyperlipidemia) LDL 79. Continue zocor 40mg  daily  6. Acute blood loss anemia Most likely post op. Last hgb 10.1. Continue ferrous sulfate 325mg  twice daily and monitor h&h  7. Insomnia Continue trazodone 50mg  daily at bedtime  8. Physical deconditioning Continue to work with PT/OT for gait/strength/balance training to restore/maximize function. Fall risk precautions  9. S/P colostomy takedown No signs of infection noted on exam. Continue tramadol 50mg  every 8 hours with oxycodone ir  5mg  1-2 tabs every 4 hours as needed for pain. Start maalox 64ml every 6 hours as needed for flatulence. Continue to monitor her status.   10. S/P partial colectomy See #9  Diagnostic Studies/Labs Ordered: cbc, bmp in 1 week  Time: 45 minutes w/ >50% of total time spent on care coordination   Family/Staff Communication Plan of care discussed with patient and nursing staff. Patient and nursing staff verbalized understanding and agree with plan of care. No additional questions or concerns reported.    Arthur Holms, MSN, AGNP-C [facility] [address] [place], [place] [address] [phone] [8am-5pm] After hours: [phone]

## 2014-12-02 ENCOUNTER — Non-Acute Institutional Stay (SKILLED_NURSING_FACILITY): Payer: Commercial Managed Care - HMO | Admitting: Internal Medicine

## 2014-12-02 DIAGNOSIS — F418 Other specified anxiety disorders: Secondary | ICD-10-CM

## 2014-12-02 DIAGNOSIS — R5381 Other malaise: Secondary | ICD-10-CM | POA: Diagnosis not present

## 2014-12-02 DIAGNOSIS — G4700 Insomnia, unspecified: Secondary | ICD-10-CM

## 2014-12-02 DIAGNOSIS — R143 Flatulence: Secondary | ICD-10-CM | POA: Diagnosis not present

## 2014-12-02 DIAGNOSIS — I5032 Chronic diastolic (congestive) heart failure: Secondary | ICD-10-CM

## 2014-12-02 DIAGNOSIS — D62 Acute posthemorrhagic anemia: Secondary | ICD-10-CM | POA: Diagnosis not present

## 2014-12-02 DIAGNOSIS — I10 Essential (primary) hypertension: Secondary | ICD-10-CM

## 2014-12-02 DIAGNOSIS — K219 Gastro-esophageal reflux disease without esophagitis: Secondary | ICD-10-CM

## 2014-12-02 DIAGNOSIS — Z9889 Other specified postprocedural states: Secondary | ICD-10-CM | POA: Diagnosis not present

## 2014-12-02 DIAGNOSIS — M792 Neuralgia and neuritis, unspecified: Secondary | ICD-10-CM | POA: Diagnosis not present

## 2014-12-02 DIAGNOSIS — F32A Depression, unspecified: Secondary | ICD-10-CM

## 2014-12-02 DIAGNOSIS — I25119 Atherosclerotic heart disease of native coronary artery with unspecified angina pectoris: Secondary | ICD-10-CM

## 2014-12-02 DIAGNOSIS — F329 Major depressive disorder, single episode, unspecified: Secondary | ICD-10-CM

## 2014-12-02 DIAGNOSIS — F419 Anxiety disorder, unspecified: Secondary | ICD-10-CM

## 2014-12-02 NOTE — Progress Notes (Signed)
Patient ID: [name], female   DOB: Jan 10, 1930, 81 y.o.   MRN: [id number]     Facility: [facility]    PCP: Nyoka Cowden, MD  Code Status: full code  No Active Allergies  Chief Complaint  Patient presents with  . New Admit To SNF     HPI:  81 year old patient is here for short term rehabilitation post hospital admission from 11/23/13 - 11/30/14 where she underwent colostomy reversal. She had sigmoid colectomy for her obstructive colon cancer, sigmoid diverticulitis with colovaginal fistula few months back. She is seen in the room with her nephew and niece present. She had a bowel movement this am. Appetite is poor. She feels weak and gets tired easily. She has PMH of CAD, HTN, HLD, CHF, chronic pain.   Review of Systems:  Constitutional: Negative for fever, chills, diaphoresis.  HENT: Negative for headache, congestion Eyes: Negative for eye pain, blurred vision, discharge.  Respiratory: Negative for cough, shortness of breath and wheezing.   Cardiovascular: Negative for chest pain. Has occasional palpitations. Has chronic leg swelling.  Gastrointestinal: positive for heartburn and nausea. Denies vomiting, abdominal pain, rectal bleed, diarrhea.  Genitourinary: Negative for dysuria Musculoskeletal: Negative for back pain, falls. Has chronic neck pain Skin: Negative for itching, rash.  Neurological: Negative for dizziness, tingling, focal weakness Psychiatric/Behavioral: Negative for depression.    Past Medical History  Diagnosis Date  . CAD (coronary artery disease)     BMS x2 to RCA 10/2007  . Essential hypertension   . Diastolic CHF, chronic   . History of TIA (transient ischemic attack)   . Hyperlipidemia   . Depression   . COPD with chronic bronchitis   . GERD (gastroesophageal reflux disease)   . History of hiatal hernia   . History of DVT of lower extremity   . Sigmoid diverticulosis   . Colovaginal fistula   . History of  adenomatous polyp of colon   . Renal insufficiency   . OA (osteoarthritis)   . DJD (degenerative joint disease)   . Wears glasses   . Wears dentures   . Colostomy in place   . Peripheral neuropathy   . Adenocarcinoma, colon Oncologist-- Dr Truitt Merle    Multifocal (2) Colon cancer @ ileocecal valve and ascending ( mT4N1Mx), Stage IIIB, grade I, MMR normal , 2 of 16 +lymph nodes, negative surgical margins---  06-02-2014  Right hemicolectomy w/ colostomy  . Chronic anemia   . History of blood transfusion    Past Surgical History  Procedure Laterality Date  . Appendectomy    . Cholecystectomy    . Abdominal hysterectomy    . Lumbar laminectomy  10-29-2002,  1969    Left  L3 -- 4  decompression  . Total knee arthroplasty Bilateral left 1994/  right 2000  . Rotator cuff repair Bilateral   . Cataract extraction w/ intraocular lens  implant, bilateral Bilateral   . Tonsillectomy    . Dilation and curettage of uterus    . Patellectomy Right 09/14/2013    Procedure: PATELLECTOMY;  Surgeon: Meredith Pel, MD;  Location: [place];  Service: Orthopedics;  Laterality: Right;  . Partial colectomy N/A 06/02/2014    Procedure: RIGHT PARTIAL COLECTOMY ;  Surgeon: Donnie Mesa, MD;  Location: [place];  Service: General;  Laterality: N/A;  . Colostomy N/A 06/02/2014    Procedure: DIVERTING DESCENDING END COLOSTOMY;  Surgeon: Donnie Mesa, MD;  Location: [place];  Service: General;  Laterality:  N/A;  . Revision total knee arthroplasty Bilateral right  04-02-2011/  left 1996 & 10-05-1999  . Anterior cervical decomp/discectomy fusion  01-31-2009    C5 -- 7  . Cardiovascular stress test  02-26-2011    Normal lexiscan no exercise study/  no ischemia/  normal LV function and wall motion, ef 67%  . Transthoracic echocardiogram  12-01-2009    Grade I diastolic dysfunction/  ef 60%/  moderate MR/  mild TR  . Evaluation under anesthesia with anal fistulectomy N/A 10/13/2014    Procedure: ANAL EXAM UNDER  ANESTHESIA ;  Surgeon: [name], MD;  Location: [facility];  Service: General;  Laterality: N/A;  . Proctoscopy N/A 10/13/2014    Procedure: RIDGE PROCTOSCOPY;  Surgeon: [name], MD;  Location: [facility];  Service: General;  Laterality: N/A;  . Coronary angioplasty with stent placement  11-04-2007  dr bensimhon    BMS x2 to RCA/  mild Non-obstructive disease LAD/  normal LVF  . Bone spurs Bilateral     Feet  . Hand surgery      Tendon repair  . Carpal tunnel release Right   . Laparoscopic sigmoid colectomy N/A 11/24/2014    Procedure: SIGMOID COLECTOMY AND COLSOTOMY CLOSURE;  Surgeon: Donnie Mesa, MD;  Location: [place];  Service: General;  Laterality: N/A;   Social History:   reports that she quit smoking about 47 years ago. Her smoking use included Cigarettes. She has a 7.5 pack-year smoking history. She has never used smokeless tobacco. She reports that she does not drink alcohol or use illicit drugs.  Family History  Problem Relation Age of Onset  . Osteoarthritis Mother   . Heart failure Mother   . Cancer Sister 20    ? colon cancer   . Cancer Brother 30    Kidney cancer   . Cancer Maternal Uncle 41    Colon cancer     Medications: Patient's Medications  New Prescriptions   No medications on file  Previous Medications   ACETAMINOPHEN (TYLENOL) 325 MG TABLET    Take 2 tablets (650 mg total) by mouth every 6 (six) hours as needed for mild pain (or Fever >/= 101).   ALUM & MAG HYDROXIDE-SIMETH (MAALOX/MYLANTA) 200-200-20 MG/5ML SUSPENSION    Take 30 mLs by mouth every 6 (six) hours as needed for indigestion, heartburn or flatulence.   ASCORBIC ACID (VITAMIN C) 500 MG CAPS    Take 1 tablet by mouth daily.    BENAZEPRIL (LOTENSIN) 40 MG TABLET    Take 1 tablet by mouth daily. Takes 2 times a week   CHOLECALCIFEROL (VITAMIN D) 1000 UNITS TABLET    Take 1,000 Units by mouth daily.   ESOMEPRAZOLE (NEXIUM) 20 MG CAPSULE    Take 20 mg by mouth daily at 12 noon. otc   FERROUS  SULFATE 325 (65 FE) MG TABLET    Take 1 tablet (325 mg total) by mouth 2 (two) times daily with a meal.   GABAPENTIN (NEURONTIN) 600 MG TABLET    Take 600 mg by mouth daily.   HYDROXYPROPYL METHYLCELLULOSE / HYPROMELLOSE (ISOPTO TEARS / GONIOVISC) 2.5 % OPHTHALMIC SOLUTION    Place 1 drop into both eyes as needed for dry eyes.   LORAZEPAM (ATIVAN) 0.5 MG TABLET    Take 1 tablet (0.5 mg total) by mouth 2 (two) times daily as needed for anxiety or sleep.   METOPROLOL (LOPRESSOR) 50 MG TABLET    Take 50 mg by mouth 2 (two) times daily.  MULTIPLE VITAMINS-MINERALS (MULTIVITAMIN WITH MINERALS) TABLET    Take 1 tablet by mouth daily.    NITROGLYCERIN (NITROSTAT) 0.4 MG SL TABLET    Place 0.4 mg under the tongue every 5 (five) minutes as needed for chest pain.    OXYCODONE (OXY IR/ROXICODONE) 5 MG IMMEDIATE RELEASE TABLET    Take 1-2 tablets (5-10 mg total) by mouth every 4 (four) hours as needed for moderate pain, severe pain or breakthrough pain.   PROMETHAZINE (PHENERGAN) 12.5 MG TABLET    Take 1 tablet by mouth every 6 (six) hours as needed. Nausea/vomitting   SERTRALINE (ZOLOFT) 50 MG TABLET    TAKE 1 TABLET EVERY DAY   SIMVASTATIN (ZOCOR) 40 MG TABLET    Take 1 tablet (40 mg total) by mouth at bedtime.   TRAMADOL (ULTRAM) 50 MG TABLET    Take 1 tablet (50 mg total) by mouth every 8 (eight) hours.   TRAZODONE (DESYREL) 50 MG TABLET    Take 50 mg by mouth at bedtime.    TRIAMCINOLONE CREAM (KENALOG) 0.1 %    Apply 1 application topically 2 (two) times daily as needed (Eczema on legs).  Modified Medications   No medications on file  Discontinued Medications   No medications on file     Physical Exam: Filed Vitals:   12/02/14 1124  BP: 130/74  Pulse: 83  Temp: 98.3 F (36.8 C)  Resp: 18  SpO2: 96%    General- elderly female, well built, in no acute distress Head- normocephalic, atraumatic Throat- dry mucus membrane Eyes- PERRLA, EOMI, no pallor, no icterus, no discharge, normal  conjunctiva, normal sclera Neck- no cervical lymphadenopathy, no jugular vein distension Cardiovascular- normal s1,s2, no murmurs, palpable dorsalis pedis, trace leg edema and 1+ ankle edema Respiratory- bilateral clear to auscultation, no wheeze, no rhonchi, no crackles, no use of accessory muscles Abdomen- bowel sounds present, soft, non tender, no guarding or rigidity, no CVA tenderness Musculoskeletal- able to move all 4 extremities, generalized weakness Neurological- no focal deficit Skin- warm and dry, 2 surgical incision on abdominal wall with staples in place, incision clean and dry, no drainage Psychiatry- alert and oriented to person, place and time, normal mood and affect    Labs reviewed: Basic Metabolic Panel:  Recent Labs  11/25/14 0250 11/26/14 0626 11/28/14 0837  NA 133* 135 138  K 4.6 4.5 4.7  CL 103 105 106  CO2 $Re'22 23 25  'HzC$ GLUCOSE 146* 112* 110*  BUN 27* 16 13  CREATININE 1.41* 1.10* 0.93  CALCIUM 8.3* 8.6* 8.5*   Liver Function Tests:  Recent Labs  07/26/14 0932 08/22/14 1125 10/25/14 0953  AST 22 39* 23  ALT 16 37* 16  ALKPHOS 69 59 68  BILITOT 0.26 0.5 0.33  PROT 7.7 7.5 7.7  ALBUMIN 3.8 3.9 3.9   No results for input(s): LIPASE, AMYLASE in the last 8760 hours. No results for input(s): AMMONIA in the last 8760 hours. CBC:  Recent Labs  07/26/14 0932 08/22/14 1125  10/25/14 0953  11/25/14 0250 11/26/14 0626 11/28/14 0837  WBC 8.4 7.6  < > 7.9  < > 10.7* 12.0* 6.8  NEUTROABS 4.9 4.9  --  5.6  --   --   --   --   HGB 11.3* 12.2  < > 12.3  < > 12.2 11.8* 10.1*  HCT 35.0 38.3  < > 38.2  < > 37.2 36.6 32.1*  MCV 83.0 87.8  < > 91.8  < > 88.2 90.4 90.2  PLT 177 179  < > 168  < > 129* 117* 134*  < > = values in this interval not displayed. Cardiac Enzymes:  Recent Labs  12/27/13 1107 12/27/13 1836  CKTOTAL  --  22  TROPONINI <0.30  --    BNP: Invalid input(s): POCBNP CBG:  Recent Labs  06/06/14 1930 06/06/14 2323 06/07/14 0400   GLUCAP 129* 117* 127*    Radiological Exams: 11/24/14: Surgical pathology Colon, segmental resection, Sigmoid -Diverticular disease, extending to the distal resection margin. -There is no evidence of malignancy   Assessment/Plan  Physical deconditioning Post op, Will have her work with physical therapy and occupational therapy team to help with gait training and muscle strengthening exercises.fall precautions. Skin care. Encourage to be out of bed.   Colostomy reversal S/p colectomy 6 months back, underwent reversal this admission. Incision healing well. Continue skin care. Has regular bowel movement. Continue tramadol 50mg  every 8 hours with oxycodone IR 5mg  1-2 tabs every 4 hours as needed for pain. Has follow up with surgery. Continue to wear abdominal binder when out of bed. Add senna 1 tab po daily prn for constipation. Hydration encouraged  GERD With nausea and heartburn. D/c nexium and have her on pantoprazole 40 mg daily for now and reassess  flatulence On malox 13ml every 6 hours as needed which has been helpful  Acute blood loss anemia Post op, continue ferrous sulfate 325 mg bid, monitor cbc  CHF Has leg edema, otherwise stable. EF 60% on echocardiogram of 11/23/14. Continue benazepril 40 mg daily and lopressor 50 mg bid. Add ted hose for lower extremities  HTN Stable bp reading. Continue current regimen of lopressor and benazepril  Depression with anxiety Continue zoloft 50 mg daily with ativan 0.5 mg twice daily as needed for anxiety.   CAD Remains chest pain free. Continue lopressor 50 mg bid, benazepril 40 mg daily, zocor 40 mg daily and prn NTG  Neuropathic pain Continue gabapentin 600 mg daily and monitor   Insomnia Continue trazodone 50mg  daily at bedtime   Goals of care: short term rehabilitation   Labs/tests ordered: cbc, bmp  Family/ staff Communication: reviewed care plan with patient and nursing supervisor    Blanchie Serve, MD  [facility]  Adult Medicine [phone] (Monday-Friday 8 am - 5 pm) [phone] (afterhours)

## 2014-12-05 ENCOUNTER — Non-Acute Institutional Stay: Payer: Commercial Managed Care - HMO | Admitting: Registered Nurse

## 2014-12-05 ENCOUNTER — Other Ambulatory Visit: Payer: Self-pay | Admitting: *Deleted

## 2014-12-05 ENCOUNTER — Encounter: Payer: Self-pay | Admitting: Registered Nurse

## 2014-12-05 DIAGNOSIS — T814XXA Infection following a procedure, initial encounter: Secondary | ICD-10-CM

## 2014-12-05 DIAGNOSIS — L7682 Other postprocedural complications of skin and subcutaneous tissue: Secondary | ICD-10-CM

## 2014-12-05 DIAGNOSIS — R208 Other disturbances of skin sensation: Secondary | ICD-10-CM

## 2014-12-05 DIAGNOSIS — IMO0001 Reserved for inherently not codable concepts without codable children: Secondary | ICD-10-CM

## 2014-12-05 MED ORDER — OXYCODONE-ACETAMINOPHEN 10-325 MG PO TABS: ORAL_TABLET | ORAL | Status: DC

## 2014-12-05 NOTE — Progress Notes (Signed)
Patient ID: [name], female   DOB: 02/24/1930, 81 y.o.   MRN: [id number]   Place of Service: [facility]  No Active Allergies  Code Status: Full Code  Goals of Care: Longevity/STR  Chief Complaint  Patient presents with  . Acute Visit    ?surgical wound infection, pain management     HPI 81 y.o. female with PMH of CAD, HTN, HLD, CHF, chronic pain,  diverticulosis, colon polyps, DJD, colon ca s/p right hemicolectomy and colostomy, colostomy takedown among others is being seen for an acute visit at the request of nursing staff for the evaluation of possible surgical wound infection and increased pain at surgical incision site. Seen in room today. Reports increased pain and redness around abdominal surgical incision. States that she has to take 2 tabs of oxycodone about every four hours for pain control. No other concerns reported.   Review of Systems Constitutional: Negative for fever, chills, and fatigue. Cardiovascular: Negative for chest pain, palpitations, and leg swelling Respiratory: Negative cough, shortness of breath, and wheezing.  Gastrointestinal: Negative for nausea and vomiting. See HPI Neurological: Negative for dizziness and headache Skin: Negative for rash. See HPI   Past Medical History  Diagnosis Date  . CAD (coronary artery disease)     BMS x2 to RCA 10/2007  . Essential hypertension   . Diastolic CHF, chronic   . History of TIA (transient ischemic attack)   . Hyperlipidemia   . Depression   . COPD with chronic bronchitis   . GERD (gastroesophageal reflux disease)   . History of hiatal hernia   . History of DVT of lower extremity   . Sigmoid diverticulosis   . Colovaginal fistula   . History of adenomatous polyp of colon   . Renal insufficiency   . OA (osteoarthritis)   . DJD (degenerative joint disease)   . Wears glasses   . Wears dentures   . Colostomy in place   . Peripheral neuropathy   . Adenocarcinoma, colon Oncologist-- Dr Truitt Merle    Multifocal (2) Colon cancer @ ileocecal valve and ascending ( mT4N1Mx), Stage IIIB, grade I, MMR normal , 2 of 16 +lymph nodes, negative surgical margins---  06-02-2014  Right hemicolectomy w/ colostomy  . Chronic anemia   . History of blood transfusion     Past Surgical History  Procedure Laterality Date  . Appendectomy    . Cholecystectomy    . Abdominal hysterectomy    . Lumbar laminectomy  10-29-2002,  1969    Left  L3 -- 4  decompression  . Total knee arthroplasty Bilateral left 1994/  right 2000  . Rotator cuff repair Bilateral   . Cataract extraction w/ intraocular lens  implant, bilateral Bilateral   . Tonsillectomy    . Dilation and curettage of uterus    . Patellectomy Right 09/14/2013    Procedure: PATELLECTOMY;  Surgeon: Meredith Pel, MD;  Location: [place];  Service: Orthopedics;  Laterality: Right;  . Partial colectomy N/A 06/02/2014    Procedure: RIGHT PARTIAL COLECTOMY ;  Surgeon: Donnie Mesa, MD;  Location: [place];  Service: General;  Laterality: N/A;  . Colostomy N/A 06/02/2014    Procedure: DIVERTING DESCENDING END COLOSTOMY;  Surgeon: Donnie Mesa, MD;  Location: [place];  Service: General;  Laterality: N/A;  . Revision total knee arthroplasty Bilateral right  04-02-2011/  left 1996 & 10-05-1999  . Anterior cervical decomp/discectomy fusion  01-31-2009    C5 -- 7  .  Cardiovascular stress test  02-26-2011    Normal lexiscan no exercise study/  no ischemia/  normal LV function and wall motion, ef 67%  . Transthoracic echocardiogram  12-01-2009    Grade I diastolic dysfunction/  ef 60%/  moderate MR/  mild TR  . Evaluation under anesthesia with anal fistulectomy N/A 10/13/2014    Procedure: ANAL EXAM UNDER ANESTHESIA ;  Surgeon: [name], MD;  Location: [facility];  Service: General;  Laterality: N/A;  . Proctoscopy N/A 10/13/2014    Procedure: RIDGE PROCTOSCOPY;  Surgeon: [name], MD;  Location: [facility];  Service: General;  Laterality: N/A;  .  Coronary angioplasty with stent placement  11-04-2007  dr bensimhon    BMS x2 to RCA/  mild Non-obstructive disease LAD/  normal LVF  . Bone spurs Bilateral     Feet  . Hand surgery      Tendon repair  . Carpal tunnel release Right   . Laparoscopic sigmoid colectomy N/A 11/24/2014    Procedure: SIGMOID COLECTOMY AND COLSOTOMY CLOSURE;  Surgeon: Donnie Mesa, MD;  Location: [place];  Service: General;  Laterality: N/A;    History  Substance Use Topics  . Smoking status: Former Smoker -- 0.50 packs/day for 15 years    Types: Cigarettes    Quit date: 07/16/1967  . Smokeless tobacco: Never Used  . Alcohol Use: No    Family History  Problem Relation Age of Onset  . Osteoarthritis Mother   . Heart failure Mother   . Cancer Sister 33    ? colon cancer   . Cancer Brother 30    Kidney cancer   . Cancer Maternal Uncle 70    Colon cancer       Medication List       This list is accurate as of: 12/05/14 12:03 PM.  Always use your most recent med list.               acetaminophen 325 MG tablet  Commonly known as:  TYLENOL  Take 2 tablets (650 mg total) by mouth every 6 (six) hours as needed for mild pain (or Fever >/= 101).     alum & mag hydroxide-simeth 200-200-20 MG/5ML suspension  Commonly known as:  MAALOX/MYLANTA  Take 30 mLs by mouth every 6 (six) hours as needed for indigestion, heartburn or flatulence.     benazepril 40 MG tablet  Commonly known as:  LOTENSIN  Take 1 tablet by mouth daily. Takes 2 times a week     cephALEXin 500 MG capsule  Commonly known as:  KEFLEX  Take 500 mg by mouth 3 (three) times daily.     cholecalciferol 1000 UNITS tablet  Commonly known as:  VITAMIN D  Take 1,000 Units by mouth daily.     ferrous sulfate 325 (65 FE) MG tablet  Take 1 tablet (325 mg total) by mouth 2 (two) times daily with a meal.     gabapentin 600 MG tablet  Commonly known as:  NEURONTIN  Take 600 mg by mouth daily.     hydroxypropyl methylcellulose /  hypromellose 2.5 % ophthalmic solution  Commonly known as:  ISOPTO TEARS / GONIOVISC  Place 1 drop into both eyes as needed for dry eyes.     LORazepam 0.5 MG tablet  Commonly known as:  ATIVAN  Take 1 tablet (0.5 mg total) by mouth 2 (two) times daily as needed for anxiety or sleep.     metoprolol 50 MG tablet  Commonly  known as:  LOPRESSOR  Take 50 mg by mouth 2 (two) times daily.     multivitamin with minerals tablet  Take 1 tablet by mouth daily.     nitroGLYCERIN 0.4 MG SL tablet  Commonly known as:  NITROSTAT  Place 0.4 mg under the tongue every 5 (five) minutes as needed for chest pain.     oxyCODONE-acetaminophen 10-325 MG per tablet  Commonly known as:  PERCOCET  Take 1 tablet by mouth every 4 (four) hours as needed for pain.     oxyCODONE-acetaminophen 10-325 MG per tablet  Commonly known as:  PERCOCET  Take one tablet by mouth every four hours as needed for moderate to severe pain. Hold for sedation. Do not exceed 4gm of Tylenol in 24 hours     pantoprazole 40 MG tablet  Commonly known as:  PROTONIX  Take 40 mg by mouth daily.     promethazine 12.5 MG tablet  Commonly known as:  PHENERGAN  Take 1 tablet by mouth every 6 (six) hours as needed. Nausea/vomitting     saccharomyces boulardii 250 MG capsule  Commonly known as:  FLORASTOR  Take 250 mg by mouth 2 (two) times daily.     senna 8.6 MG Tabs tablet  Commonly known as:  SENOKOT  Take 1 tablet by mouth daily as needed for mild constipation.     sertraline 50 MG tablet  Commonly known as:  ZOLOFT  TAKE 1 TABLET EVERY DAY     simvastatin 40 MG tablet  Commonly known as:  ZOCOR  Take 1 tablet (40 mg total) by mouth at bedtime.     traZODone 50 MG tablet  Commonly known as:  DESYREL  Take 50 mg by mouth at bedtime.     triamcinolone cream 0.1 %  Commonly known as:  KENALOG  Apply 1 application topically 2 (two) times daily as needed (Eczema on legs).     Vitamin C 500 MG Caps  Take 1 tablet by  mouth daily.        Physical Exam  BP 136/74 mmHg  Pulse 74  Temp(Src) 98.7 F (37.1 C)  Resp 18  SpO2 99%  Constitutional: WDWN elderly female in no acute distress. Conversant and pleasant HEENT: Normocephalic and atraumatic. PERRL. EOM intact. No icterus. Oral mucosa moist. Posterior pharynx clear of any exudate or lesions.  Neck: Supple and nontender. No lymphadenopathy, masses, or thyromegaly. No JVD or carotid bruits. Cardiac: Normal S1, S2. RRR without appreciable murmurs, rubs, or gallops. Distal pulses intact. Trace dependent edema.  Respiratory: Unlabored respiration. Breath sounds clear bilaterally without rales, rhonchi, or wheezes. GI: Audible bowel sounds in all quadrants. Soft, tender to palpation but nondistended. Surgical incision to mid abdomen and LLQ with staples. Large area of erythema and induration around surgical incision sites (marked for reference) with scant amount of yellow drainage noted on abdominal pad.  Musculoskeletal: able to move all extremities. Generalized weakness.   Skin: Warm and dry.  Neurological: Alert and oriented to person, place, and time Psychiatric: Judgment and insight adequate. Appropriate mood and affect.   Labs Reviewed  CBC Latest Ref Rng 11/28/2014 11/26/2014 11/25/2014  WBC 4.0 - 10.5 K/uL 6.8 12.0(H) 10.7(H)  Hemoglobin 12.0 - 15.0 g/dL 10.1(L) 11.8(L) 12.2  Hematocrit 36.0 - 46.0 % 32.1(L) 36.6 37.2  Platelets 150 - 400 K/uL 134(L) 117(L) 129(L)    CMP Latest Ref Rng 11/28/2014 11/26/2014 11/25/2014  Glucose 65 - 99 mg/dL 110(H) 112(H) 146(H)  BUN 6 - 20  mg/dL 13 16 27(H)  Creatinine 0.44 - 1.00 mg/dL 0.93 1.10(H) 1.41(H)  Sodium 135 - 145 mmol/L 138 135 133(L)  Potassium 3.5 - 5.1 mmol/L 4.7 4.5 4.6  Chloride 101 - 111 mmol/L 106 105 103  CO2 22 - 32 mmol/L $RemoveB'25 23 22  'nhGIqogu$ Calcium 8.9 - 10.3 mg/dL 8.5(L) 8.6(L) 8.3(L)  Total Protein 6.4 - 8.3 g/dL - - -  Total Bilirubin 0.20 - 1.20 mg/dL - - -  Alkaline Phos 40 - 150 U/L - - -    AST 5 - 34 U/L - - -  ALT 0 - 55 U/L - - -    Lab Results  Component Value Date   HGBA1C 5.9* 05/31/2014    Lab Results  Component Value Date   TSH 4.160 05/31/2014    Lipid Panel     Component Value Date/Time   CHOL 130 01/19/2013 1014   TRIG 87.0 01/19/2013 1014   HDL 33.60* 01/19/2013 1014   CHOLHDL 4 01/19/2013 1014   VLDL 17.4 01/19/2013 1014   [place] 79 01/19/2013 1014   Diagnostic Studies Reviewed 11/16/14: EKG: sinus bradycardia with marked sinus arrhythmia  11/24/14: Surgical pathology Colon, segmental resection, Sigmoid -Diverticular disease, extending to the distal resection margin. -There is no evidence of malignancy  Assessment & Plan 1. Surgical wound infection, initial encounter Keflex $RemoveBeforeDE'500mg'UzepwAHssjBilam$  three times daily x 10 days w/ florastor $RemoveBefo'250mg'ACEtMEMYQCE$  twice daily x 10 days. Will have her f/u with surgeon sooner than previously planned. Continue to monitor her status.   2. Pain at surgical incision Discontinue tramadol due to ineffectiveness. Discontinue oxycododne. Start percocet 10/$RemoveBeforeDEI'325mg'FBpzLKnvaOhkuINP$  every four hours as needed for pain. Reassess and continue to monitor her status.   Family/Staff Communication Plan of care discussed with patient and nursing staff. Patient and nursing staff verbalized understanding and agree with plan of care. No additional questions or concerns reported.    Arthur Holms, MSN, AGNP-C [facility] [address] [place], [place] [address] [phone] [8am-5pm] After hours: [phone]

## 2014-12-05 NOTE — Telephone Encounter (Signed)
[facility] 

## 2014-12-07 LAB — CBC AND DIFFERENTIAL
HCT: 26 % — AB (ref 36–46)
Hemoglobin: 8.6 g/dL — AB (ref 12.0–16.0)
Platelets: 285 10*3/uL (ref 150–399)
WBC: 6.4 10^3/mL

## 2014-12-07 LAB — BASIC METABOLIC PANEL
BUN: 14 mg/dL (ref 4–21)
Creatinine: 1 mg/dL (ref 0.5–1.1)
Glucose: 90 mg/dL
Potassium: 4.1 mmol/L (ref 3.4–5.3)
Sodium: 139 mmol/L (ref 137–147)

## 2014-12-08 ENCOUNTER — Non-Acute Institutional Stay (SKILLED_NURSING_FACILITY): Payer: Commercial Managed Care - HMO | Admitting: Registered Nurse

## 2014-12-08 DIAGNOSIS — R52 Pain, unspecified: Secondary | ICD-10-CM | POA: Diagnosis not present

## 2014-12-08 NOTE — Progress Notes (Signed)
Patient ID: [name], female   DOB: Jan 04, 1930, 81 y.o.   MRN: [id number]   Place of Service: [facility]  No Active Allergies  Code Status: Full Code  Goals of Care: Longevity/STR  Chief Complaint  Patient presents with  . Acute Visit    pain management    HPI 81 y.o. female with PMH of CAD, HTN, HLD, CHF, chronic pain,  diverticulosis, colon polyps, DJD, colon ca s/p right hemicolectomy and colostomy, colostomy takedown among others is being seen for an acute visit at the request of nursing staff inadequate pain management. She is currently on abx for surgical wound infection. Now has a small wet to dry dressing where some staples were removed during her f/u visit with the surgeon. States she still has a lot of pain around surgical area. Pain medication is working but feels like her pain control would be better if she could get it routinely. Denies any other concerns  Review of Systems Constitutional: Negative for fever, chills, and fatigue. Cardiovascular: Negative for chest pain, palpitations, and leg swelling Respiratory: Negative cough, shortness of breath, and wheezing.  Gastrointestinal: Negative for nausea and vomiting. See HPI Neurological: Negative for dizziness and headache Skin: Negative for rash.   Past Medical History  Diagnosis Date  . CAD (coronary artery disease)     BMS x2 to RCA 10/2007  . Essential hypertension   . Diastolic CHF, chronic   . History of TIA (transient ischemic attack)   . Hyperlipidemia   . Depression   . COPD with chronic bronchitis   . GERD (gastroesophageal reflux disease)   . History of hiatal hernia   . History of DVT of lower extremity   . Sigmoid diverticulosis   . Colovaginal fistula   . History of adenomatous polyp of colon   . Renal insufficiency   . OA (osteoarthritis)   . DJD (degenerative joint disease)   . Wears glasses   . Wears dentures   . Colostomy in place   . Peripheral neuropathy   .  Adenocarcinoma, colon Oncologist-- Dr Truitt Merle    Multifocal (2) Colon cancer @ ileocecal valve and ascending ( mT4N1Mx), Stage IIIB, grade I, MMR normal , 2 of 16 +lymph nodes, negative surgical margins---  06-02-2014  Right hemicolectomy w/ colostomy  . Chronic anemia   . History of blood transfusion     Past Surgical History  Procedure Laterality Date  . Appendectomy    . Cholecystectomy    . Abdominal hysterectomy    . Lumbar laminectomy  10-29-2002,  1969    Left  L3 -- 4  decompression  . Total knee arthroplasty Bilateral left 1994/  right 2000  . Rotator cuff repair Bilateral   . Cataract extraction w/ intraocular lens  implant, bilateral Bilateral   . Tonsillectomy    . Dilation and curettage of uterus    . Patellectomy Right 09/14/2013    Procedure: PATELLECTOMY;  Surgeon: Meredith Pel, MD;  Location: [place];  Service: Orthopedics;  Laterality: Right;  . Partial colectomy N/A 06/02/2014    Procedure: RIGHT PARTIAL COLECTOMY ;  Surgeon: Donnie Mesa, MD;  Location: [place];  Service: General;  Laterality: N/A;  . Colostomy N/A 06/02/2014    Procedure: DIVERTING DESCENDING END COLOSTOMY;  Surgeon: Donnie Mesa, MD;  Location: [place];  Service: General;  Laterality: N/A;  . Revision total knee arthroplasty Bilateral right  04-02-2011/  left 1996 & 10-05-1999  . Anterior cervical decomp/discectomy fusion  01-31-2009    C5 -- 7  . Cardiovascular stress test  02-26-2011    Normal lexiscan no exercise study/  no ischemia/  normal LV function and wall motion, ef 67%  . Transthoracic echocardiogram  12-01-2009    Grade I diastolic dysfunction/  ef 60%/  moderate MR/  mild TR  . Evaluation under anesthesia with anal fistulectomy N/A 10/13/2014    Procedure: ANAL EXAM UNDER ANESTHESIA ;  Surgeon: [name], MD;  Location: [facility];  Service: General;  Laterality: N/A;  . Proctoscopy N/A 10/13/2014    Procedure: RIDGE PROCTOSCOPY;  Surgeon: [name], MD;  Location: [facility];   Service: General;  Laterality: N/A;  . Coronary angioplasty with stent placement  11-04-2007  dr bensimhon    BMS x2 to RCA/  mild Non-obstructive disease LAD/  normal LVF  . Bone spurs Bilateral     Feet  . Hand surgery      Tendon repair  . Carpal tunnel release Right   . Laparoscopic sigmoid colectomy N/A 11/24/2014    Procedure: SIGMOID COLECTOMY AND COLSOTOMY CLOSURE;  Surgeon: Donnie Mesa, MD;  Location: [place];  Service: General;  Laterality: N/A;    History  Substance Use Topics  . Smoking status: Former Smoker -- 0.50 packs/day for 15 years    Types: Cigarettes    Quit date: 07/16/1967  . Smokeless tobacco: Never Used  . Alcohol Use: No    Family History  Problem Relation Age of Onset  . Osteoarthritis Mother   . Heart failure Mother   . Cancer Sister 71    ? colon cancer   . Cancer Brother 30    Kidney cancer   . Cancer Maternal Uncle 70    Colon cancer       Medication List       This list is accurate as of: 12/08/14  4:14 PM.  Always use your most recent med list.               acetaminophen 325 MG tablet  Commonly known as:  TYLENOL  Take 2 tablets (650 mg total) by mouth every 6 (six) hours as needed for mild pain (or Fever >/= 101).     alum & mag hydroxide-simeth 200-200-20 MG/5ML suspension  Commonly known as:  MAALOX/MYLANTA  Take 30 mLs by mouth every 6 (six) hours as needed for indigestion, heartburn or flatulence.     benazepril 40 MG tablet  Commonly known as:  LOTENSIN  Take 1 tablet by mouth daily. Takes 2 times a week     cephALEXin 500 MG capsule  Commonly known as:  KEFLEX  Take 500 mg by mouth 3 (three) times daily.     cholecalciferol 1000 UNITS tablet  Commonly known as:  VITAMIN D  Take 1,000 Units by mouth daily.     ferrous sulfate 325 (65 FE) MG tablet  Take 1 tablet (325 mg total) by mouth 2 (two) times daily with a meal.     gabapentin 600 MG tablet  Commonly known as:  NEURONTIN  Take 600 mg by mouth daily.      hydroxypropyl methylcellulose / hypromellose 2.5 % ophthalmic solution  Commonly known as:  ISOPTO TEARS / GONIOVISC  Place 1 drop into both eyes as needed for dry eyes.     LORazepam 0.5 MG tablet  Commonly known as:  ATIVAN  Take 1 tablet (0.5 mg total) by mouth 2 (two) times daily as needed for anxiety or sleep.  metoprolol 50 MG tablet  Commonly known as:  LOPRESSOR  Take 50 mg by mouth 2 (two) times daily.     multivitamin with minerals tablet  Take 1 tablet by mouth daily.     nitroGLYCERIN 0.4 MG SL tablet  Commonly known as:  NITROSTAT  Place 0.4 mg under the tongue every 5 (five) minutes as needed for chest pain.     oxyCODONE-acetaminophen 10-325 MG per tablet  Commonly known as:  PERCOCET  Take 1 tablet by mouth 4 (four) times daily.     oxyCODONE-acetaminophen 5-325 MG per tablet  Commonly known as:  PERCOCET/ROXICET  Take 1 tablet by mouth 2 (two) times daily as needed for severe pain.     pantoprazole 40 MG tablet  Commonly known as:  PROTONIX  Take 40 mg by mouth daily.     promethazine 12.5 MG tablet  Commonly known as:  PHENERGAN  Take 1 tablet by mouth every 6 (six) hours as needed. Nausea/vomitting     saccharomyces boulardii 250 MG capsule  Commonly known as:  FLORASTOR  Take 250 mg by mouth 2 (two) times daily.     senna 8.6 MG Tabs tablet  Commonly known as:  SENOKOT  Take 1 tablet by mouth daily as needed for mild constipation.     sertraline 50 MG tablet  Commonly known as:  ZOLOFT  TAKE 1 TABLET EVERY DAY     simvastatin 40 MG tablet  Commonly known as:  ZOCOR  Take 1 tablet (40 mg total) by mouth at bedtime.     traZODone 50 MG tablet  Commonly known as:  DESYREL  Take 50 mg by mouth at bedtime.     triamcinolone cream 0.1 %  Commonly known as:  KENALOG  Apply 1 application topically 2 (two) times daily as needed (Eczema on legs).     Vitamin C 500 MG Caps  Take 1 tablet by mouth daily.        Physical Exam  BP 149/79  mmHg  Pulse 65  Temp(Src) 98.1 F (36.7 C)  Resp 15  Constitutional: WDWN elderly female in no acute distress. Conversant and pleasant HEENT: Normocephalic and atraumatic. PERRL. EOM intact. No icterus.  Cardiac: Normal S1, S2. RRR without appreciable murmurs, rubs, or gallops. Distal pulses intact. Trace dependent edema.  Respiratory: Unlabored respiration. Breath sounds clear bilaterally without rales, rhonchi, or wheezes. GI: Audible bowel sounds in all quadrants. Soft, tender to palpation but nondistended. Surgical incision to mid abdomen and LLQ with staples. Large area of erythema and induration around surgical incision sites is receding. Wet to dry dressing intact. Musculoskeletal: able to move all extremities. Generalized weakness.   Skin: Warm and dry.  Neurological: Alert and oriented to person, place, and time Psychiatric: Judgment and insight adequate. Appropriate mood and affect.   Labs Reviewed  CBC Latest Ref Rng 11/28/2014 11/26/2014 11/25/2014  WBC 4.0 - 10.5 K/uL 6.8 12.0(H) 10.7(H)  Hemoglobin 12.0 - 15.0 g/dL 10.1(L) 11.8(L) 12.2  Hematocrit 36.0 - 46.0 % 32.1(L) 36.6 37.2  Platelets 150 - 400 K/uL 134(L) 117(L) 129(L)    CMP Latest Ref Rng 11/28/2014 11/26/2014 11/25/2014  Glucose 65 - 99 mg/dL 110(H) 112(H) 146(H)  BUN 6 - 20 mg/dL 13 16 27(H)  Creatinine 0.44 - 1.00 mg/dL 0.93 1.10(H) 1.41(H)  Sodium 135 - 145 mmol/L 138 135 133(L)  Potassium 3.5 - 5.1 mmol/L 4.7 4.5 4.6  Chloride 101 - 111 mmol/L 106 105 103  CO2 22 - 32 mmol/L  25 23 22   Calcium 8.9 - 10.3 mg/dL 8.5(L) 8.6(L) 8.3(L)  Total Protein 6.4 - 8.3 g/dL - - -  Total Bilirubin 0.20 - 1.20 mg/dL - - -  Alkaline Phos 40 - 150 U/L - - -  AST 5 - 34 U/L - - -  ALT 0 - 55 U/L - - -    Lab Results  Component Value Date   HGBA1C 5.9* 05/31/2014    Lab Results  Component Value Date   TSH 4.160 05/31/2014    Lipid Panel     Component Value Date/Time   CHOL 130 01/19/2013 1014   TRIG 87.0  01/19/2013 1014   HDL 33.60* 01/19/2013 1014   CHOLHDL 4 01/19/2013 1014   VLDL 17.4 01/19/2013 1014   [place] 79 01/19/2013 1014   Diagnostic Studies Reviewed 11/16/14: EKG: sinus bradycardia with marked sinus arrhythmia  11/24/14: Surgical pathology Colon, segmental resection, Sigmoid -Diverticular disease, extending to the distal resection margin. -There is no evidence of malignancy  Assessment & Plan 1. Uncontrolled pain S/p colostomy takedown with surgical site infection. Explain to patient risks vs. Benefits of narcotic use in elderly, especially at high dose including sedation, fall, fall-related injury, respiratory depression, nausea, constipation, etc. Patient verbalizes understanding this Change percocet 10/325mg  to every 6 hours routinely with Percocet 5/325mg  every 12 hours as needed for breakthrough pain. Hold for sedation. Reassess and continue to monitor her status.   Family/Staff Communication Plan of care discussed with patient and nursing staff. Patient and nursing staff verbalized understanding and agree with plan of care. No additional questions or concerns reported.    Arthur Holms, MSN, AGNP-C [facility] [address] [place],  [address] [phone] [8am-5pm] After hours: [phone]

## 2014-12-14 ENCOUNTER — Encounter: Payer: Self-pay | Admitting: Registered Nurse

## 2014-12-14 ENCOUNTER — Non-Acute Institutional Stay: Payer: Commercial Managed Care - HMO | Admitting: Registered Nurse

## 2014-12-14 DIAGNOSIS — K219 Gastro-esophageal reflux disease without esophagitis: Secondary | ICD-10-CM

## 2014-12-14 DIAGNOSIS — F418 Other specified anxiety disorders: Secondary | ICD-10-CM | POA: Diagnosis not present

## 2014-12-14 DIAGNOSIS — E785 Hyperlipidemia, unspecified: Secondary | ICD-10-CM | POA: Diagnosis not present

## 2014-12-14 DIAGNOSIS — I10 Essential (primary) hypertension: Secondary | ICD-10-CM

## 2014-12-14 DIAGNOSIS — F329 Major depressive disorder, single episode, unspecified: Secondary | ICD-10-CM

## 2014-12-14 DIAGNOSIS — R5381 Other malaise: Secondary | ICD-10-CM

## 2014-12-14 DIAGNOSIS — F419 Anxiety disorder, unspecified: Secondary | ICD-10-CM

## 2014-12-14 DIAGNOSIS — G4700 Insomnia, unspecified: Secondary | ICD-10-CM

## 2014-12-14 DIAGNOSIS — Z9889 Other specified postprocedural states: Secondary | ICD-10-CM

## 2014-12-14 DIAGNOSIS — Z9049 Acquired absence of other specified parts of digestive tract: Secondary | ICD-10-CM

## 2014-12-14 DIAGNOSIS — T814XXD Infection following a procedure, subsequent encounter: Secondary | ICD-10-CM | POA: Diagnosis not present

## 2014-12-14 DIAGNOSIS — D62 Acute posthemorrhagic anemia: Secondary | ICD-10-CM

## 2014-12-14 DIAGNOSIS — F32A Depression, unspecified: Secondary | ICD-10-CM

## 2014-12-14 DIAGNOSIS — IMO0001 Reserved for inherently not codable concepts without codable children: Secondary | ICD-10-CM

## 2014-12-14 NOTE — Progress Notes (Signed)
Patient ID: [name], female   DOB: February 25, 1930, 81 y.o.   MRN: [id number]   Place of Service: [facility]  No Active Allergies  Code Status: Full Code  Goals of Care: Longevity/STR  Chief Complaint  Patient presents with  . Discharge Note    HPI  81 y.o. female with PMH of CAD, HTN, HLD, CHF, chronic pain,  diverticulosis, colon polyps, DJD among others is being seen for a discharge visit. She was here for short term rehab post hospital admission from 11/23/13 to 11/30/14 with s/p sigmoid colectomy with colostomy reversal. About 6 months ago, patient underwent urgent right hemicolectomy for obstructing colon cancer. At that time she had also been diagnosed with a colovaginal fistula from recurrent diverticulitis and also underwent descending colostomy to divert stool from her distal colon to allow the colovaginal fistula to heal. Fistula is no longer evident per recent OP note but, the sigmoid colon appears quite strictured and tortuous-sigmoid colectomy along with colostomy reversal were recommended. She has worked well with therapy team and is ready to discharge home on 12/17/14 with [facility] PT/OT/RN.  Seen in room today. Reports pain is adequately controlled with current regimen. No other concerns reported.   Review of Systems Constitutional: Negative for fever, chills, and fatigue. HENT: Negative for ear pain and congestion Eyes: Negative for eye pain and eye discharge Cardiovascular: Negative for chest pain, palpitations, and leg swelling Respiratory: Negative cough, shortness of breath, and wheezing.  Gastrointestinal: Negative for nausea and vomiting.  Genitourinary: Negative for  dysuria Musculoskeletal: Negative for uncontrolled pain Neurological: Negative for dizziness and headache Skin: Negative for rash and wound.   Psychiatric: Negative for depression   Past Medical History  Diagnosis Date  . CAD (coronary artery disease)     BMS x2 to RCA 10/2007  . Essential  hypertension   . Diastolic CHF, chronic   . History of TIA (transient ischemic attack)   . Hyperlipidemia   . Depression   . COPD with chronic bronchitis   . GERD (gastroesophageal reflux disease)   . History of hiatal hernia   . History of DVT of lower extremity   . Sigmoid diverticulosis   . Colovaginal fistula   . History of adenomatous polyp of colon   . Renal insufficiency   . OA (osteoarthritis)   . DJD (degenerative joint disease)   . Wears glasses   . Wears dentures   . Colostomy in place   . Peripheral neuropathy   . Adenocarcinoma, colon Oncologist-- Dr Truitt Merle    Multifocal (2) Colon cancer @ ileocecal valve and ascending ( mT4N1Mx), Stage IIIB, grade I, MMR normal , 2 of 16 +lymph nodes, negative surgical margins---  06-02-2014  Right hemicolectomy w/ colostomy  . Chronic anemia   . History of blood transfusion     Past Surgical History  Procedure Laterality Date  . Appendectomy    . Cholecystectomy    . Abdominal hysterectomy    . Lumbar laminectomy  10-29-2002,  1969    Left  L3 -- 4  decompression  . Total knee arthroplasty Bilateral left 1994/  right 2000  . Rotator cuff repair Bilateral   . Cataract extraction w/ intraocular lens  implant, bilateral Bilateral   . Tonsillectomy    . Dilation and curettage of uterus    . Patellectomy Right 09/14/2013    Procedure: PATELLECTOMY;  Surgeon: Meredith Pel, MD;  Location: [place];  Service: Orthopedics;  Laterality: Right;  .  Partial colectomy N/A 06/02/2014    Procedure: RIGHT PARTIAL COLECTOMY ;  Surgeon: Donnie Mesa, MD;  Location: [place];  Service: General;  Laterality: N/A;  . Colostomy N/A 06/02/2014    Procedure: DIVERTING DESCENDING END COLOSTOMY;  Surgeon: Donnie Mesa, MD;  Location: [place];  Service: General;  Laterality: N/A;  . Revision total knee arthroplasty Bilateral right  04-02-2011/  left 1996 & 10-05-1999  . Anterior cervical decomp/discectomy fusion  01-31-2009    C5 -- 7  .  Cardiovascular stress test  02-26-2011    Normal lexiscan no exercise study/  no ischemia/  normal LV function and wall motion, ef 67%  . Transthoracic echocardiogram  12-01-2009    Grade I diastolic dysfunction/  ef 60%/  moderate MR/  mild TR  . Evaluation under anesthesia with anal fistulectomy N/A 10/13/2014    Procedure: ANAL EXAM UNDER ANESTHESIA ;  Surgeon: [name], MD;  Location: [facility];  Service: General;  Laterality: N/A;  . Proctoscopy N/A 10/13/2014    Procedure: RIDGE PROCTOSCOPY;  Surgeon: [name], MD;  Location: [facility];  Service: General;  Laterality: N/A;  . Coronary angioplasty with stent placement  11-04-2007  dr bensimhon    BMS x2 to RCA/  mild Non-obstructive disease LAD/  normal LVF  . Bone spurs Bilateral     Feet  . Hand surgery      Tendon repair  . Carpal tunnel release Right   . Laparoscopic sigmoid colectomy N/A 11/24/2014    Procedure: SIGMOID COLECTOMY AND COLSOTOMY CLOSURE;  Surgeon: Donnie Mesa, MD;  Location: [place];  Service: General;  Laterality: N/A;    History  Substance Use Topics  . Smoking status: Former Smoker -- 0.50 packs/day for 15 years    Types: Cigarettes    Quit date: 07/16/1967  . Smokeless tobacco: Never Used  . Alcohol Use: No    Family History  Problem Relation Age of Onset  . Osteoarthritis Mother   . Heart failure Mother   . Cancer Sister 27    ? colon cancer   . Cancer Brother 30    Kidney cancer   . Cancer Maternal Uncle 70    Colon cancer       Medication List       This list is accurate as of: 12/14/14 12:20 PM.  Always use your most recent med list.               acetaminophen 325 MG tablet  Commonly known as:  TYLENOL  Take 2 tablets (650 mg total) by mouth every 6 (six) hours as needed for mild pain (or Fever >/= 101).     alum & mag hydroxide-simeth 200-200-20 MG/5ML suspension  Commonly known as:  MAALOX/MYLANTA  Take 30 mLs by mouth every 6 (six) hours as needed for indigestion, heartburn  or flatulence.     benazepril 40 MG tablet  Commonly known as:  LOTENSIN  Take 1 tablet by mouth daily. Takes 2 times a week     cephALEXin 500 MG capsule  Commonly known as:  KEFLEX  Take 500 mg by mouth 3 (three) times daily.     cholecalciferol 1000 UNITS tablet  Commonly known as:  VITAMIN D  Take 1,000 Units by mouth daily.     ferrous sulfate 325 (65 FE) MG tablet  Take 1 tablet (325 mg total) by mouth 2 (two) times daily with a meal.     gabapentin 600 MG tablet  Commonly known  as:  NEURONTIN  Take 600 mg by mouth daily.     hydroxypropyl methylcellulose / hypromellose 2.5 % ophthalmic solution  Commonly known as:  ISOPTO TEARS / GONIOVISC  Place 1 drop into both eyes as needed for dry eyes.     LORazepam 0.5 MG tablet  Commonly known as:  ATIVAN  Take 1 tablet (0.5 mg total) by mouth 2 (two) times daily as needed for anxiety or sleep.     metoprolol 50 MG tablet  Commonly known as:  LOPRESSOR  Take 50 mg by mouth 2 (two) times daily.     multivitamin with minerals tablet  Take 1 tablet by mouth daily.     nitroGLYCERIN 0.4 MG SL tablet  Commonly known as:  NITROSTAT  Place 0.4 mg under the tongue every 5 (five) minutes as needed for chest pain.     oxyCODONE-acetaminophen 10-325 MG per tablet  Commonly known as:  PERCOCET  Take 1 tablet by mouth 4 (four) times daily.     oxyCODONE-acetaminophen 5-325 MG per tablet  Commonly known as:  PERCOCET/ROXICET  Take 1 tablet by mouth 2 (two) times daily as needed for severe pain.     pantoprazole 40 MG tablet  Commonly known as:  PROTONIX  Take 40 mg by mouth daily.     promethazine 12.5 MG tablet  Commonly known as:  PHENERGAN  Take 1 tablet by mouth every 6 (six) hours as needed. Nausea/vomitting     saccharomyces boulardii 250 MG capsule  Commonly known as:  FLORASTOR  Take 250 mg by mouth 2 (two) times daily.     senna 8.6 MG Tabs tablet  Commonly known as:  SENOKOT  Take 1 tablet by mouth daily as  needed for mild constipation.     sertraline 50 MG tablet  Commonly known as:  ZOLOFT  TAKE 1 TABLET EVERY DAY     simvastatin 40 MG tablet  Commonly known as:  ZOCOR  Take 1 tablet (40 mg total) by mouth at bedtime.     traZODone 50 MG tablet  Commonly known as:  DESYREL  Take 50 mg by mouth at bedtime.     triamcinolone cream 0.1 %  Commonly known as:  KENALOG  Apply 1 application topically 2 (two) times daily as needed (Eczema on legs).     Vitamin C 500 MG Caps  Take 1 tablet by mouth daily.        Physical Exam  BP 117/73 mmHg  Pulse 83  Temp(Src) 98.3 F (36.8 C)  Resp 16  Ht $R'5\' 3"'Jn$  (1.6 m)  Wt 169 lb 3.2 oz (76.749 kg)  BMI 29.98 kg/m2  SpO2 96%  Constitutional: WDWN elderly female in no acute distress. Conversant and pleasant HEENT: Normocephalic and atraumatic. PERRL. EOM intact. No icterus. Oral mucosa moist. Posterior pharynx clear of any exudate or lesions.  Neck: Supple and nontender. No lymphadenopathy, masses, or thyromegaly. No JVD or carotid bruits. Cardiac: Normal S1, S2. RRR without appreciable murmurs, rubs, or gallops. Distal pulses intact. Trace dependent edema.  Respiratory: Unlabored respiration. Breath sounds clear bilaterally without rales, rhonchi, or wheezes. GI: Audible bowel sounds in all quadrants. Soft, tender to palpation but nondistended. Surgical incision to mid abdomen and LLQ with staples and small open area. Erythema and induration around surgical incision almost gone with small amount of drainage.  Musculoskeletal: able to move all extremities. Generalized weakness.   Skin: Warm and dry.  Neurological: Alert and oriented to person, place, and time  Psychiatric: Judgment and insight adequate. Appropriate mood and affect.   Labs Reviewed  CBC Latest Ref Rng 12/07/2014 11/28/2014 11/26/2014  WBC - 6.4 6.8 12.0(H)  Hemoglobin 12.0 - 16.0 g/dL 8.6(A) 10.1(L) 11.8(L)  Hematocrit 36 - 46 % 26(A) 32.1(L) 36.6  Platelets 150 - 399 K/L 285  134(L) 117(L)    CMP Latest Ref Rng 12/07/2014 11/28/2014 11/26/2014  Glucose 65 - 99 mg/dL - 110(H) 112(H)  BUN 4 - 21 mg/dL 14 13 16   Creatinine 0.5 - 1.1 mg/dL 1.0 0.93 1.10(H)  Sodium 137 - 147 mmol/L 139 138 135  Potassium 3.4 - 5.3 mmol/L 4.1 4.7 4.5  Chloride 101 - 111 mmol/L - 106 105  CO2 22 - 32 mmol/L - 25 23  Calcium 8.9 - 10.3 mg/dL - 8.5(L) 8.6(L)  Total Protein 6.4 - 8.3 g/dL - - -  Total Bilirubin 0.20 - 1.20 mg/dL - - -  Alkaline Phos 40 - 150 U/L - - -  AST 5 - 34 U/L - - -  ALT 0 - 55 U/L - - -    Lab Results  Component Value Date   HGBA1C 5.9* 05/31/2014    Lab Results  Component Value Date   TSH 4.160 05/31/2014    Lipid Panel     Component Value Date/Time   CHOL 130 01/19/2013 1014   TRIG 87.0 01/19/2013 1014   HDL 33.60* 01/19/2013 1014   CHOLHDL 4 01/19/2013 1014   VLDL 17.4 01/19/2013 1014   Lampeter 79 01/19/2013 1014   Diagnostic Studies Reviewed 11/16/14: EKG: sinus bradycardia with marked sinus arrhythmia  11/24/14: Surgical pathology Colon, segmental resection, Sigmoid -Diverticular disease, extending to the distal resection margin. -There is no evidence of malignancy  Assessment & Plan 1. Surgical wound infection Much improved. Will complete 10-day course of abx today. Continue to f/u with surgeon. Will be discharged home with [facility] RN for wound management and monitoring  2. Gastroesophageal reflux disease, esophagitis presence not specified Stable. Continue ppi  3. Anxiety and depression Mood stable. Continue zoloft 50mg  with ativan 0.5mg  twice daily as needed for anxiety.   4. Essential hypertension Stable. Continue benzepril 40mg  daily with lopressor 50mg  twice daily. PCP to monitor bp readings  5. HLD (hyperlipidemia) LDL 79. Continue zocor 40mg  daily  6. Acute blood loss anemia Most likely post op.  Last hgb 8.6. Hemodynamically stable. Continue ferrous sulfate 325mg  twice daily. PCP to monitor h&h  7. Insomnia Stable.  Continue trazodone 50mg  daily at bedtime  8. Physical deconditioning Improving. Continue to work with [facility] PT/OT for gait/strength/balance training to restore/maximize function. Fall risk precautions  9. S/P colostomy takedown See #1. Pain is adequately controlled with current regimen. Continue Percocet 10/325mg  four times daily with percocet 5/325mg  twice daily as needed for breakthrough pain. Continue to f/u with surgeon  10. S/P partial colectomy See #9  Home health services:PT/OT/RN DME required: None PCP follow-up: Please schedule f/u appt with PCP w/in 1-2 weeks of discharge from skilled nursing facility  30-day supply of prescription medications provided (#45 Percocet 10/325mg , #30 Percocet 5/325mg )    Family/Staff Communication Plan of care discussed with patient and nursing staff. Patient and nursing staff verbalized understanding and agree with plan of care. No additional questions or concerns reported.    Arthur Holms, MSN, AGNP-C [facility] [address] [place],  [id number] [phone] [8am-5pm] After hours: [phone]

## 2014-12-23 DIAGNOSIS — N824 Other female intestinal-genital tract fistulae: Secondary | ICD-10-CM | POA: Diagnosis not present

## 2014-12-23 DIAGNOSIS — C189 Malignant neoplasm of colon, unspecified: Secondary | ICD-10-CM | POA: Diagnosis not present

## 2014-12-23 DIAGNOSIS — Z48815 Encounter for surgical aftercare following surgery on the digestive system: Secondary | ICD-10-CM | POA: Diagnosis not present

## 2014-12-23 DIAGNOSIS — K5792 Diverticulitis of intestine, part unspecified, without perforation or abscess without bleeding: Secondary | ICD-10-CM | POA: Diagnosis not present

## 2014-12-27 ENCOUNTER — Encounter: Payer: Self-pay | Admitting: Internal Medicine

## 2014-12-27 ENCOUNTER — Ambulatory Visit (INDEPENDENT_AMBULATORY_CARE_PROVIDER_SITE_OTHER): Payer: Commercial Managed Care - HMO | Admitting: Internal Medicine

## 2014-12-27 VITALS — BP 150/80 | HR 50 | Temp 97.7°F | Resp 20 | Ht 63.0 in | Wt 168.0 lb

## 2014-12-27 DIAGNOSIS — I10 Essential (primary) hypertension: Secondary | ICD-10-CM | POA: Diagnosis not present

## 2014-12-27 DIAGNOSIS — I5032 Chronic diastolic (congestive) heart failure: Secondary | ICD-10-CM

## 2014-12-27 DIAGNOSIS — N824 Other female intestinal-genital tract fistulae: Secondary | ICD-10-CM | POA: Diagnosis not present

## 2014-12-27 DIAGNOSIS — R531 Weakness: Secondary | ICD-10-CM | POA: Diagnosis not present

## 2014-12-27 DIAGNOSIS — C189 Malignant neoplasm of colon, unspecified: Secondary | ICD-10-CM

## 2014-12-27 NOTE — Progress Notes (Signed)
Pre visit review using our clinic review tool, if applicable. No additional management support is needed unless otherwise documented below in the visit note. 

## 2014-12-27 NOTE — Progress Notes (Signed)
Subjective:    Patient ID: [name], female    DOB: 1930-01-21, 81 y.o.   MRN: [id number]  HPI   Admit date: 11/24/2014 Discharge date: 11/30/2014  Admission Diagnoses: Sigmoid diverticulitis with colovaginal fistula/ diverting colostomy Discharge Diagnoses: Same Active Problems:  Sigmoid diverticulitis   Discharged Condition: good  Hospital Course: Open sigmoid colectomy/ colostomy reversal on 11/24/14. She slowly regained bowel function over the next three days. She had issues with pain control but we were able to control her pain with PO Oxycodone. Physical therapy and occupational therapy recommended temporary SNF placement. This has been arranged.  81 year old patient who is seen following a recent hospital discharge.  She was admitted for rehabilitation and was discharged home on June 4.  She continues to do quite well with improved strength and appetite.  There has been some modest weight gain since she left the rehabilitation facility.  She is doing quite well.  She has hypertension and dyslipidemia.  She is scheduled for general surgery follow-up soon.  Her wounD is slowly improving.  She is still packing the mid abdominal incision daily.  Still having minimal abdominal pain, but improving  Past Medical History  Diagnosis Date  . CAD (coronary artery disease)     BMS x2 to RCA 10/2007  . Essential hypertension   . Diastolic CHF, chronic   . History of TIA (transient ischemic attack)   . Hyperlipidemia   . Depression   . COPD with chronic bronchitis   . GERD (gastroesophageal reflux disease)   . History of hiatal hernia   . History of DVT of lower extremity   . Sigmoid diverticulosis   . Colovaginal fistula   . History of adenomatous polyp of colon   . Renal insufficiency   . OA (osteoarthritis)   . DJD (degenerative joint disease)   . Wears glasses   . Wears dentures   . Colostomy in place   . Peripheral neuropathy   . Adenocarcinoma, colon  Oncologist-- Dr Truitt Merle    Multifocal (2) Colon cancer @ ileocecal valve and ascending ( mT4N1Mx), Stage IIIB, grade I, MMR normal , 2 of 16 +lymph nodes, negative surgical margins---  06-02-2014  Right hemicolectomy w/ colostomy  . Chronic anemia   . History of blood transfusion     History   Social History  . Marital Status: Married    Spouse Name: N/A  . Number of Children: N/A  . Years of Education: N/A   Occupational History  . Not on file.   Social History Main Topics  . Smoking status: Former Smoker -- 0.50 packs/day for 15 years    Types: Cigarettes    Quit date: 07/16/1967  . Smokeless tobacco: Never Used  . Alcohol Use: No  . Drug Use: No  . Sexual Activity: Not on file   Other Topics Concern  . Not on file   Social History Narrative   Married, 2 children.  As of November 2015 her husband has been living in a nursing home for tendon after years as he suffers from multiple medical problems.   Patient does not drink alcohol nor does she smoke or chew tobacco products   Right handed   8th grade   1 cup daily    Past Surgical History  Procedure Laterality Date  . Appendectomy    . Cholecystectomy    . Abdominal hysterectomy    . Lumbar laminectomy  10-29-2002,  1969    Left  L3 --  4  decompression  . Total knee arthroplasty Bilateral left 1994/  right 2000  . Rotator cuff repair Bilateral   . Cataract extraction w/ intraocular lens  implant, bilateral Bilateral   . Tonsillectomy    . Dilation and curettage of uterus    . Patellectomy Right 09/14/2013    Procedure: PATELLECTOMY;  Surgeon: Meredith Pel, MD;  Location: [place];  Service: Orthopedics;  Laterality: Right;  . Partial colectomy N/A 06/02/2014    Procedure: RIGHT PARTIAL COLECTOMY ;  Surgeon: Donnie Mesa, MD;  Location: [place];  Service: General;  Laterality: N/A;  . Colostomy N/A 06/02/2014    Procedure: DIVERTING DESCENDING END COLOSTOMY;  Surgeon: Donnie Mesa, MD;  Location: [place];   Service: General;  Laterality: N/A;  . Revision total knee arthroplasty Bilateral right  04-02-2011/  left 1996 & 10-05-1999  . Anterior cervical decomp/discectomy fusion  01-31-2009    C5 -- 7  . Cardiovascular stress test  02-26-2011    Normal lexiscan no exercise study/  no ischemia/  normal LV function and wall motion, ef 67%  . Transthoracic echocardiogram  12-01-2009    Grade I diastolic dysfunction/  ef 60%/  moderate MR/  mild TR  . Evaluation under anesthesia with anal fistulectomy N/A 10/13/2014    Procedure: ANAL EXAM UNDER ANESTHESIA ;  Surgeon: [name], MD;  Location: [facility];  Service: General;  Laterality: N/A;  . Proctoscopy N/A 10/13/2014    Procedure: RIDGE PROCTOSCOPY;  Surgeon: [name], MD;  Location: [facility];  Service: General;  Laterality: N/A;  . Coronary angioplasty with stent placement  11-04-2007  dr bensimhon    BMS x2 to RCA/  mild Non-obstructive disease LAD/  normal LVF  . Bone spurs Bilateral     Feet  . Hand surgery      Tendon repair  . Carpal tunnel release Right   . Laparoscopic sigmoid colectomy N/A 11/24/2014    Procedure: SIGMOID COLECTOMY AND COLSOTOMY CLOSURE;  Surgeon: Donnie Mesa, MD;  Location: [facility];  Service: General;  Laterality: N/A;    Family History  Problem Relation Age of Onset  . Osteoarthritis Mother   . Heart failure Mother   . Cancer Sister 26    ? colon cancer   . Cancer Brother 30    Kidney cancer   . Cancer Maternal Uncle 59    Colon cancer     No Active Allergies  Current Outpatient Prescriptions on File Prior to Visit  Medication Sig Dispense Refill  . acetaminophen (TYLENOL) 325 MG tablet Take 2 tablets (650 mg total) by mouth every 6 (six) hours as needed for mild pain (or Fever >/= 101).    Marland Kitchen alum & mag hydroxide-simeth (MAALOX/MYLANTA) 200-200-20 MG/5ML suspension Take 30 mLs by mouth every 6 (six) hours as needed for indigestion, heartburn or flatulence.    . Ascorbic Acid (VITAMIN C) 500 MG CAPS Take 1  tablet by mouth daily.     . benazepril (LOTENSIN) 40 MG tablet Take 1 tablet by mouth daily. Takes 2 times a week    . cholecalciferol (VITAMIN D) 1000 UNITS tablet Take 1,000 Units by mouth daily.    . ferrous sulfate 325 (65 FE) MG tablet Take 1 tablet (325 mg total) by mouth 2 (two) times daily with a meal. (Patient taking differently: Take 325 mg by mouth daily with breakfast. )  3  . gabapentin (NEURONTIN) 600 MG tablet Take 600 mg by mouth daily.    Marland Kitchen  hydroxypropyl methylcellulose / hypromellose (ISOPTO TEARS / GONIOVISC) 2.5 % ophthalmic solution Place 1 drop into both eyes as needed for dry eyes.    Marland Kitchen LORazepam (ATIVAN) 0.5 MG tablet Take 1 tablet (0.5 mg total) by mouth 2 (two) times daily as needed for anxiety or sleep. 60 tablet 5  . metoprolol (LOPRESSOR) 50 MG tablet Take 50 mg by mouth 2 (two) times daily.    . Multiple Vitamins-Minerals (MULTIVITAMIN WITH MINERALS) tablet Take 1 tablet by mouth daily.     . nitroGLYCERIN (NITROSTAT) 0.4 MG SL tablet Place 0.4 mg under the tongue every 5 (five) minutes as needed for chest pain.     Marland Kitchen oxyCODONE-acetaminophen (PERCOCET/ROXICET) 5-325 MG per tablet Take 1 tablet by mouth 2 (two) times daily as needed for severe pain.    . pantoprazole (PROTONIX) 40 MG tablet Take 40 mg by mouth daily.    . promethazine (PHENERGAN) 12.5 MG tablet Take 1 tablet by mouth every 6 (six) hours as needed. Nausea/vomitting    . senna (SENOKOT) 8.6 MG TABS tablet Take 1 tablet by mouth daily as needed for mild constipation.    . sertraline (ZOLOFT) 50 MG tablet TAKE 1 TABLET EVERY DAY 90 tablet 1  . simvastatin (ZOCOR) 40 MG tablet Take 1 tablet (40 mg total) by mouth at bedtime. 90 tablet 3  . traZODone (DESYREL) 50 MG tablet Take 50 mg by mouth at bedtime.     . triamcinolone cream (KENALOG) 0.1 % Apply 1 application topically 2 (two) times daily as needed (Eczema on legs).     No current facility-administered medications on file prior to visit.    BP  150/80 mmHg  Pulse 50  Temp(Src) 97.7 F (36.5 C) (Oral)  Resp 20  Ht $R'5\' 3"'Pp$  (1.6 m)  Wt 168 lb (76.204 kg)  BMI 29.77 kg/m2  SpO2 96%     Review of Systems  Constitutional: Negative.   HENT: Negative for congestion, dental problem, hearing loss, rhinorrhea, sinus pressure, sore throat and tinnitus.   Eyes: Negative for pain, discharge and visual disturbance.  Respiratory: Negative for cough and shortness of breath.   Cardiovascular: Negative for chest pain, palpitations and leg swelling.  Gastrointestinal: Positive for abdominal pain. Negative for nausea, vomiting, diarrhea, constipation, blood in stool and abdominal distention.  Genitourinary: Negative for dysuria, urgency, frequency, hematuria, flank pain, vaginal bleeding, vaginal discharge, difficulty urinating, vaginal pain and pelvic pain.  Musculoskeletal: Negative for joint swelling, arthralgias and gait problem.  Skin: Positive for wound. Negative for rash.  Neurological: Negative for dizziness, syncope, speech difficulty, weakness, numbness and headaches.  Hematological: Negative for adenopathy.  Psychiatric/Behavioral: Negative for behavioral problems, dysphoric mood and agitation. The patient is not nervous/anxious.        Objective:   Physical Exam  Constitutional: She is oriented to person, place, and time. She appears well-developed and well-nourished.  Clinically, looks good In good spirits Blood pressure 140/80 Afebrile  HENT:  Head: Normocephalic.  Right Ear: External ear normal.  Left Ear: External ear normal.  Mouth/Throat: Oropharynx is clear and moist.  Eyes: Conjunctivae and EOM are normal. Pupils are equal, round, and reactive to light.  Neck: Normal range of motion. Neck supple. No thyromegaly present.  Cardiovascular: Normal rate, regular rhythm, normal heart sounds and intact distal pulses.   Pulmonary/Chest: Effort normal and breath sounds normal.  Abdominal: Soft. Bowel sounds are normal. She  exhibits no mass. There is no tenderness.  Midline surgical scar Superior aspect was still an  open wound with packing in place Appears clean without purulence  Musculoskeletal: Normal range of motion.  Lymphadenopathy:    She has no cervical adenopathy.  Neurological: She is alert and oriented to person, place, and time.  Skin: Skin is warm and dry. No rash noted.  Psychiatric: She has a normal mood and affect. Her behavior is normal.          Assessment & Plan:   Status post colostomy reversal Osteoarthritis Essential hypertension, stable Coronary artery disease, stable  Follow general surgery and oncology Recheck here 3-6 months  Will report any new or worsening symptoms Continue local wound care

## 2014-12-27 NOTE — Patient Instructions (Signed)
Limit your sodium (Salt) intake  Please check your blood pressure on a regular basis.  If it is consistently greater than 150/90, please make an office appointment.  Return in 3 months for follow-up  

## 2014-12-30 ENCOUNTER — Other Ambulatory Visit: Payer: Self-pay | Admitting: Internal Medicine

## 2015-01-24 ENCOUNTER — Ambulatory Visit (HOSPITAL_BASED_OUTPATIENT_CLINIC_OR_DEPARTMENT_OTHER): Payer: Commercial Managed Care - HMO | Admitting: Hematology

## 2015-01-24 ENCOUNTER — Encounter: Payer: Self-pay | Admitting: Hematology

## 2015-01-24 ENCOUNTER — Other Ambulatory Visit (HOSPITAL_BASED_OUTPATIENT_CLINIC_OR_DEPARTMENT_OTHER): Payer: Commercial Managed Care - HMO

## 2015-01-24 ENCOUNTER — Telehealth: Payer: Self-pay | Admitting: Hematology

## 2015-01-24 ENCOUNTER — Other Ambulatory Visit: Payer: Self-pay | Admitting: *Deleted

## 2015-01-24 VITALS — BP 156/66 | HR 56 | Temp 98.1°F | Resp 18 | Ht 63.0 in | Wt 172.8 lb

## 2015-01-24 DIAGNOSIS — C189 Malignant neoplasm of colon, unspecified: Secondary | ICD-10-CM

## 2015-01-24 DIAGNOSIS — C182 Malignant neoplasm of ascending colon: Secondary | ICD-10-CM | POA: Diagnosis not present

## 2015-01-24 DIAGNOSIS — D649 Anemia, unspecified: Secondary | ICD-10-CM

## 2015-01-24 LAB — COMPREHENSIVE METABOLIC PANEL (CC13)
ALT: 17 U/L (ref 0–55)
AST: 24 U/L (ref 5–34)
Albumin: 3.7 g/dL (ref 3.5–5.0)
Alkaline Phosphatase: 65 U/L (ref 40–150)
Anion Gap: 6 mEq/L (ref 3–11)
BUN: 36.7 mg/dL — ABNORMAL HIGH (ref 7.0–26.0)
CO2: 26 mEq/L (ref 22–29)
Calcium: 9.2 mg/dL (ref 8.4–10.4)
Chloride: 108 mEq/L (ref 98–109)
Creatinine: 1.2 mg/dL — ABNORMAL HIGH (ref 0.6–1.1)
EGFR: 42 mL/min/{1.73_m2} — ABNORMAL LOW (ref >90)
Glucose: 100 mg/dl (ref 70–140)
Potassium: 4.9 mEq/L (ref 3.5–5.1)
Sodium: 140 mEq/L (ref 136–145)
Total Bilirubin: 0.32 mg/dL (ref 0.20–1.20)
Total Protein: 7.1 g/dL (ref 6.4–8.3)

## 2015-01-24 LAB — CBC WITH DIFFERENTIAL/PLATELET
BASO%: 0.6 % (ref 0.0–2.0)
Basophils Absolute: 0 10*3/uL (ref 0.0–0.1)
EOS%: 1.4 % (ref 0.0–7.0)
Eosinophils Absolute: 0.1 10*3/uL (ref 0.0–0.5)
HCT: 33.5 % — ABNORMAL LOW (ref 34.8–46.6)
HGB: 11.1 g/dL — ABNORMAL LOW (ref 11.6–15.9)
LYMPH%: 21.1 % (ref 14.0–49.7)
MCH: 29.4 pg (ref 25.1–34.0)
MCHC: 33.1 g/dL (ref 31.5–36.0)
MCV: 89 fL (ref 79.5–101.0)
MONO#: 0.5 10*3/uL (ref 0.1–0.9)
MONO%: 8.1 % (ref 0.0–14.0)
NEUT#: 4.7 10*3/uL (ref 1.5–6.5)
NEUT%: 68.8 % (ref 38.4–76.8)
Platelets: 166 10*3/uL (ref 145–400)
RBC: 3.76 10*6/uL (ref 3.70–5.45)
RDW: 15.6 % — ABNORMAL HIGH (ref 11.2–14.5)
WBC: 6.8 10*3/uL (ref 3.9–10.3)
lymph#: 1.4 10*3/uL (ref 0.9–3.3)

## 2015-01-24 LAB — IRON AND TIBC CHCC
%SAT: 15 % — ABNORMAL LOW (ref 21–57)
Iron: 47 ug/dL (ref 41–142)
TIBC: 303 ug/dL (ref 236–444)
UIBC: 256 ug/dL (ref 120–384)

## 2015-01-24 LAB — FERRITIN CHCC: Ferritin: 50 ng/ml (ref 9–269)

## 2015-01-24 NOTE — Telephone Encounter (Signed)
Gave and printed appt sched and avs fo rpt for OCT...gave barium

## 2015-01-24 NOTE — Progress Notes (Signed)
[place]  [phone] Fax:([phone]) [place] Note   Patient Care Team: Marletta Lor, MD as PCP - General (Internal Medicine) 01/24/2015  CHIEF COMPLAINTS:  Follow up multifocal colon cancer    Adenocarcinoma, colon   06/02/2014 Initial Diagnosis Adenocarcinoma, colon. she presented with anemia, nauseam anorexia and abdominal pain and was admitted [facility]/2014.   06/02/2014 Surgery exploratory laparotomy, right hemicolectomy, and descending colostomy by Dr. Georgette Dover   06/02/2014 Pathologic Stage multifocal (2) colon cancer at  ileocecal valve and ascending colon.,  mT4N1Mx, at least stage IIIB, grade 1, MMR normal, surgical margins negative.    06/02/2014 Tumor Marker CEA 1.7   11/24/2014 Surgery SIGMOID COLECTOMY AND COLSOTOMY CLOSURE with Donnie Mesa     HISTORY OF PRESENTING ILLNESS:  [name] 81 y.o. female is here because of recently diagnosed and resected multifocal colon cancer. I saw him when she was admitted to the hospital, and she is here with her family members for follow-up.  She has a history of chronic anemia and colovaginal fistula as well as diverticulosis with colonic polyps since 2008, was admitted to [facility] on 05/31/2014 With progressive abdominal pain, worse over the last couple of weeks, accompanied by nausea and vomiting, without weight loss. Workup included a CT of the abdomen and pelvis with contrast, which revealed acute small bowel obstruction due to what appears to be a soft tissue mass (apple core lesion) at the ileocecal valve suspicious for intestinal adenocarcinoma.This mass measures 6 x 4 cm. Up to 8 mm stable pericecal lymph nodes were visualized. No liver or distant metastasis identified. Small volume free fluid in the abdomen and pelvis, Likely reactive, was seen. No free air.  She underwent exploratory laparotomy, right hemicolectomy, and descending colostomy by Dr. Georgette Dover on  06/02/2014. Grossly, there are two tumors identified. The larger tumor is located at the ileocecal valve and consists of an 8.5 cm span of moderately differentiated adenocarcinoma that involves the serosa. This tumor has associated perineural and lymphovascular invasion. The second discontiguous tumor is present in the proximal right colon and spans 4.0 cm and consists of well differentiated adenocarcinoma that is morphologically dissimilar from the larger tumor. This tumor extends into the muscularis propria.Her CEA is 1.7. It is stage mT4, N1b. immunohistochemical stains are normal, with very low probability of microsatellite instability (See details below ).  She was discharged to rehabilitation after her surgery. She has been doing well at to rehabilitation. She had a surgical wound infection a few weeks after surgery, which was treated with oral antibiotics and wound care. She was not very physically active before the surgery due to her multiple arthritis, she is recovering well at the nursing home and is likely going home in a few days. She is able to move around with a walker, has good appetite, surgical site pain is controlled, no nausea or other complaints. She did developed some UTI symptoms, and started Cipro and probiotics yesterday at the nursing home.  INTERIM HISTORY She returns for follow up, she came into clinic with her daughter today. She underwent colostomy reversal surgery in [place] 2016, and recovered well. She has loose bowel movement once or twice a day, sometimes normal bowel movement. No significant abdominal pain, nausea or other complaints. Her energy level has improved, she is able to function relatively well at home. She is taking iron pill twice a day. No pain or other new complaints.  MEDICAL HISTORY:  Past  Medical History  Diagnosis Date  . CAD (coronary artery disease)     BMS x2 to RCA 10/2007  . Essential hypertension   . Diastolic CHF, chronic   . History of TIA  (transient ischemic attack)   . Hyperlipidemia   . Depression   . COPD with chronic bronchitis   . GERD (gastroesophageal reflux disease)   . History of hiatal hernia   . History of DVT of lower extremity   . Sigmoid diverticulosis   . Colovaginal fistula   . History of adenomatous polyp of colon   . Renal insufficiency   . OA (osteoarthritis)   . DJD (degenerative joint disease)   . Wears glasses   . Wears dentures   . Colostomy in place   . Peripheral neuropathy   . Adenocarcinoma, colon Oncologist-- Dr Truitt Merle    Multifocal (2) Colon cancer @ ileocecal valve and ascending ( mT4N1Mx), Stage IIIB, grade I, MMR normal , 2 of 16 +lymph nodes, negative surgical margins---  06-02-2014  Right hemicolectomy w/ colostomy  . Chronic anemia   . History of blood transfusion     SURGICAL HISTORY: Past Surgical History  Procedure Laterality Date  . Appendectomy    . Cholecystectomy    . Abdominal hysterectomy    . Lumbar laminectomy  10-29-2002,  1969    Left  L3 -- 4  decompression  . Total knee arthroplasty Bilateral left 1994/  right 2000  . Rotator cuff repair Bilateral   . Cataract extraction w/ intraocular lens  implant, bilateral Bilateral   . Tonsillectomy    . Dilation and curettage of uterus    . Patellectomy Right 09/14/2013    Procedure: PATELLECTOMY;  Surgeon: Meredith Pel, MD;  Location: [place];  Service: Orthopedics;  Laterality: Right;  . Partial colectomy N/A 06/02/2014    Procedure: RIGHT PARTIAL COLECTOMY ;  Surgeon: Donnie Mesa, MD;  Location: [place];  Service: General;  Laterality: N/A;  . Colostomy N/A 06/02/2014    Procedure: DIVERTING DESCENDING END COLOSTOMY;  Surgeon: Donnie Mesa, MD;  Location: [place];  Service: General;  Laterality: N/A;  . Revision total knee arthroplasty Bilateral right  04-02-2011/  left 1996 & 10-05-1999  . Anterior cervical decomp/discectomy fusion  01-31-2009    C5 -- 7  . Cardiovascular stress test  02-26-2011    Normal  lexiscan no exercise study/  no ischemia/  normal LV function and wall motion, ef 67%  . Transthoracic echocardiogram  12-01-2009    Grade I diastolic dysfunction/  ef 60%/  moderate MR/  mild TR  . Evaluation under anesthesia with anal fistulectomy N/A 10/13/2014    Procedure: ANAL EXAM UNDER ANESTHESIA ;  Surgeon: [name], MD;  Location: [facility];  Service: General;  Laterality: N/A;  . Proctoscopy N/A 10/13/2014    Procedure: RIDGE PROCTOSCOPY;  Surgeon: [name], MD;  Location: [facility];  Service: General;  Laterality: N/A;  . Coronary angioplasty with stent placement  11-04-2007  dr [email or web address]    BMS x2 to RCA/  mild Non-obstructive disease LAD/  normal LVF  . Bone spurs Bilateral     Feet  . Hand surgery      Tendon repair  . Carpal tunnel release Right   . Laparoscopic sigmoid colectomy N/A 11/24/2014    Procedure: SIGMOID COLECTOMY AND COLSOTOMY CLOSURE;  Surgeon: Donnie Mesa, MD;  Location: [place];  Service: General;  Laterality: N/A;    SOCIAL HISTORY: History   Social History  .  Marital Status: Married    Spouse Name: N/A  . Number of Children: N/A  . Years of Education: N/A   Occupational History  . Not on file.   Social History Main Topics  . Smoking status: Former Smoker -- 0.50 packs/day for 15 years    Types: Cigarettes    Quit date: 07/16/1967  . Smokeless tobacco: Never Used  . Alcohol Use: No  . Drug Use: No  . Sexual Activity: Not on file   Other Topics Concern  . Not on file   Social History Narrative   Married, 2 children.  As of November 2015 her husband has been living in a nursing home for tendon after years as he suffers from multiple medical problems.   Patient does not drink alcohol nor does she smoke or chew tobacco products   Right handed   8th grade   1 cup daily   She worked for [employer] for 4 years.    FAMILY HISTORY: Family History  Problem Relation Age of Onset  . Osteoarthritis Mother   . Heart failure Mother   .  Cancer Sister 30    ? colon cancer   . Cancer Brother 30    Kidney cancer   . Cancer Maternal Uncle 67    Colon cancer     ALLERGIES:  has No Known Allergies.  MEDICATIONS:  Current Outpatient Prescriptions  Medication Sig Dispense Refill  . acetaminophen (TYLENOL) 325 MG tablet Take 2 tablets (650 mg total) by mouth every 6 (six) hours as needed for mild pain (or Fever >/= 101).    Marland Kitchen alum & mag hydroxide-simeth (MAALOX/MYLANTA) 200-200-20 MG/5ML suspension Take 30 mLs by mouth every 6 (six) hours as needed for indigestion, heartburn or flatulence.    . Ascorbic Acid (VITAMIN C) 500 MG CAPS Take 1 tablet by mouth daily.     . benazepril (LOTENSIN) 40 MG tablet TAKE 1 TABLET EVERY DAY 90 tablet 1  . cholecalciferol (VITAMIN D) 1000 UNITS tablet Take 1,000 Units by mouth daily.    . ferrous sulfate 325 (65 FE) MG tablet Take 1 tablet (325 mg total) by mouth 2 (two) times daily with a meal. (Patient taking differently: Take 325 mg by mouth daily with breakfast. )  3  . gabapentin (NEURONTIN) 600 MG tablet Take 600 mg by mouth daily.    . Hydrocodone-Acetaminophen 5-300 MG TABS Take 1 tablet by mouth as needed.    . hydroxypropyl methylcellulose / hypromellose (ISOPTO TEARS / GONIOVISC) 2.5 % ophthalmic solution Place 1 drop into both eyes as needed for dry eyes.    Marland Kitchen LORazepam (ATIVAN) 0.5 MG tablet Take 1 tablet (0.5 mg total) by mouth 2 (two) times daily as needed for anxiety or sleep. 60 tablet 5  . metoprolol (LOPRESSOR) 50 MG tablet Take 50 mg by mouth 2 (two) times daily.    . Multiple Vitamins-Minerals (MULTIVITAMIN WITH MINERALS) tablet Take 1 tablet by mouth daily.     . nitroGLYCERIN (NITROSTAT) 0.4 MG SL tablet Place 0.4 mg under the tongue every 5 (five) minutes as needed for chest pain.     Marland Kitchen oxyCODONE-acetaminophen (PERCOCET/ROXICET) 5-325 MG per tablet Take 1 tablet by mouth 2 (two) times daily as needed for severe pain.    . pantoprazole (PROTONIX) 40 MG tablet Take 40 mg by  mouth daily.    . promethazine (PHENERGAN) 12.5 MG tablet Take 1 tablet by mouth every 6 (six) hours as needed. Nausea/vomitting    . senna (  SENOKOT) 8.6 MG TABS tablet Take 1 tablet by mouth daily as needed for mild constipation.    . sertraline (ZOLOFT) 50 MG tablet TAKE 1 TABLET EVERY DAY 90 tablet 1  . simvastatin (ZOCOR) 40 MG tablet Take 1 tablet (40 mg total) by mouth at bedtime. 90 tablet 3  . traZODone (DESYREL) 50 MG tablet Take 50 mg by mouth at bedtime.     . triamcinolone cream (KENALOG) 0.1 % Apply 1 application topically 2 (two) times daily as needed (Eczema on legs).    Marland Kitchen oxyCODONE-acetaminophen (PERCOCET) 10-325 MG per tablet Take 1 tablet by mouth as needed.     No current facility-administered medications for this visit.    REVIEW OF SYSTEMS:   Constitutional: Denies fevers, chills or abnormal night sweats Eyes: Denies blurriness of vision, double vision or watery eyes Ears, nose, mouth, throat, and face: Denies mucositis or sore throat Respiratory: Denies cough, dyspnea or wheezes Cardiovascular: Denies palpitation, chest discomfort or lower extremity swelling Gastrointestinal:  Denies nausea, heartburn or change in bowel habits Skin: Denies abnormal skin rashes Lymphatics: Denies new lymphadenopathy or easy bruising Neurological:Denies numbness, tingling or new weaknesses Behavioral/Psych: Mood is stable, no new changes  All other systems were reviewed with the patient and are negative.  PHYSICAL EXAMINATION: ECOG PERFORMANCE STATUS: 1-2  Filed Vitals:   01/24/15 0951  BP: 156/66  Pulse: 56  Temp: 98.1 F (36.7 C)  Resp: 18   Filed Weights   01/24/15 0951  Weight: 172 lb 12.8 oz (78.382 kg)    GENERAL:alert, no distress and comfortable SKIN: skin color, texture, turgor are normal, no rashes or significant lesions EYES: normal, conjunctiva are pink and non-injected, sclera clear OROPHARYNX:no exudate, no erythema and lips, buccal mucosa, and tongue  normal  NECK: supple, thyroid normal size, non-tender, without nodularity LYMPH:  no palpable lymphadenopathy in the cervical, axillary or inguinal LUNGS: clear to auscultation and percussion with normal breathing effort HEART: regular rate & rhythm and no murmurs and no lower extremity edema ABDOMEN:abdomen soft, the midline surgical wound is well healed, appears clean. non-tender and normal bowel sounds Musculoskeletal:no cyanosis of digits and no clubbing  PSYCH: alert & oriented x 3 with fluent speech NEURO: no focal motor/sensory deficits.   LABORATORY DATA:  I have reviewed the data as listed CBC Latest Ref Rng 01/24/2015 12/07/2014 11/28/2014  WBC 3.9 - 10.3 10e3/uL 6.8 6.4 6.8  Hemoglobin 11.6 - 15.9 g/dL 11.1(L) 8.6(A) 10.1(L)  Hematocrit 34.8 - 46.6 % 33.5(L) 26(A) 32.1(L)  Platelets 145 - 400 10e3/uL 166 285 134(L)    CMP Latest Ref Rng 01/24/2015 12/07/2014 11/28/2014  Glucose 70 - 140 mg/dl 100 - 110(H)  BUN 7.0 - 26.0 mg/dL 36.7(H) 14 13  Creatinine 0.6 - 1.1 mg/dL 1.2(H) 1.0 0.93  Sodium 136 - 145 mEq/L 140 139 138  Potassium 3.5 - 5.1 mEq/L 4.9 4.1 4.7  Chloride 101 - 111 mmol/L - - 106  CO2 22 - 29 mEq/L 26 - 25  Calcium 8.4 - 10.4 mg/dL 9.2 - 8.5(L)  Total Protein 6.4 - 8.3 g/dL 7.1 - -  Total Bilirubin 0.20 - 1.20 mg/dL 0.32 - -  Alkaline Phos 40 - 150 U/L 65 - -  AST 5 - 34 U/L 24 - -  ALT 0 - 55 U/L 17 - -   CEA  Status: Finalresult Visible to patient:  Not Released Nextappt: 05/17/2015 at 10:00 AM in [place] (Nyoka Cowden, MD) Dx:  Adenocarcinoma, colon  Ref Range 15moago  682mogo  25m34moo     CEA 0.0 - 5.0 ng/mL 1.5 1.6 1.7CM        Results for MAYQUYNN, VILCHISRN [id number] of 01/24/2015 17:09  Ref. Range 01/24/2015 10:27  Iron Latest Ref Range: 41-142 ug/dL 47  UIBC Latest Ref Range: 120-384 ug/dL 256  TIBC Latest Ref Range: 236-444 ug/dL 303  %SAT Latest Ref Range: 21-57 % 15 (L)  Ferritin Latest  Ref Range: 9-269 ng/ml 50    Pathology report: 06/02/2014 Colon, segmental resection for tumor, right - MULTIFOCAL INVASIVE ADENOCARCINOMA, SPANNING 8.5 CM AND 4.0 CM. - ADENOCARCINOMA INVOLVES THE SEROSA. - LYMPHOVASCULAR INVASION IS IDENTIFIED. 1 of 4 FINAL for White, Jayce C ([id number]) Diagnosis(continued) - PERINEURAL INVASION IS IDENTIFIED. - METASTATIC ADENOCARCINOMA IN 2 OF 16 LYMPH NODES (2/16) WITH EXTRACAPSULAR EXTENSION. - THE SURGICAL RESECTION MARGINS ARE NEGATIVE FOR ADENOCARCINOMA. - SEE ONCOLOGY TABLE BELOW. Microscopic Comment COLON AND RECTUM (INCLUDING TRANS-ANAL RESECTION): Specimen: Terminal ileum and right colon Procedure: Resection Tumor site: Ileocecal valve (tumor #1) and proximal mid ascending colon (tumor #2) Specimen integrity: Intact Macroscopic intactness of mesorectum: N/A Macroscopic tumor perforation: Not identified Invasive tumor: Tumor #1 (ileocecal valve) Maximum size: 8.5 cm (gross measurement) Histologic type(s): Adenocarcinoma Histologic grade and differentiation: G2: moderately differentiated/low grade Type of polyp in which invasive carcinoma arose: Likely tubular adenoma Microscopic extension of invasive tumor: Adenocarcinoma involves the serosa Invasive tumor: Tumor #2 (proximal mid ascending colon) Maximum size: 4.0 cm (gross measurement) Histologic type(s): Adenocarcinoma Histologic grade and differentiation: G1: well differentiated/low grade Type of polyp in which invasive carcinoma arose: Tubulovillous adenoma Microscopic extension of invasive tumor: Adenocarcinoma extends into the muscularis propria Lymph-Vascular invasion: Present Peri-neural invasion: Present Tumor deposit(s) (discontinuous extramural extension): Not identified Resection margins: Proximal margin: 32.0 cm Distal margin: 7.5 cm Circumferential (radial) (posterior ascending, posterior descending; lateral and posterior mid-rectum; and entire lower 1/3 rectum):  3.0 cm Treatment effect (neo-adjuvant therapy): N/A Additional polyp(s): Not identified Non-neoplastic findings: No significant findings Lymph nodes: number examined 16; number positive: 2 Pathologic Staging: mT4, N1b Ancillary studies: MSI testing will be performed on the larger higher grade tumor and the results reported separately. Additional studies can be performed upon clinician request. Comments: Grossly, there are two tumors identified. The larger tumor is located at the ileocecal valve and consists of an 8.5 cm span of moderately differentiated adenocarcinoma that involves the serosa. This tumor has associated perineural and lymphovascular invasion. The second discontiguous tumor is present in the proximal right colon and spans 4.0 cm and consists of well differentiated adenocarcinoma that is morphologically dissimilar from the larger tumor. This tumor extends into the muscularis propria. JOSEnid Cutter Pathologist, Electronic Signature (Case signed 06/06/2014) Specimen Gross and Clinical Information   RADIOGRAPHIC STUDIES: I have personally reviewed the radiological images as listed and agreed with the findings in the report.  Ct abdomen and pelvis with contrast 09/14/2014 IMPRESSION: *Although there is still a soft tissue linear extension noted extending from the region of the sigmoid colon toward the vagina, no contrast enters this extension and no air is seen within the vagina or the urinary bladder to suggest the presence of persistent fistulous communication. *Multiple diverticula within the rectosigmoid colon. *Stable moderate size hiatal hernia  ASSESSMENT & PLAN:  [age]ar old Caucasian female, with multiple medical comorbidities, presented with anemia, abdominal pain and nausea. She underwent exploratory laparotomy, right hemicolectomy, and descending colostomy by Dr. TsuGeorgette Dover 06/02/2014. Surgical path reviewed multifocal (2) colon  cancer at  ileocecal valve and  ascending colon.  1. Multifocal invasive colon adenocarcinoma, mT4N1Mx, at least stage IIIB, grade 1, MMR normal. -I reviewed her surgical findings and her CT of abdomen and pelvis with patient and her extended family members in great detail today. Both of her colon cancer wall completely surgically resected, however she does have high risk of cancer recurrence in the future due to locally advanced stage.  -CT chest showed no evidence of metastasis. -We again discussed the role of adjuvant chemotherapy. Given her advanced age, multiple comorbidities, limited performance status, prolonged wound healing issue, I do not strongly recommend adjuvant chemotherapy, even chemotherapy alone. She has also made up her mind not to take chemotherapy. -We discussed surveillance of her colon cancer, for total 5 years. I'll see her with lab test including CEA every 3-4 months for the first 2 years, then every 6-12 months for additional 3 years. She will need a repeat colonoscopy and repeat CT scan in one year. -She is clinically doing well, exam is unremarkable. CEA from today still pending. -Continue surveillance, we'll obtain a CT of abdomen and pelvis in 3 months. -She will have a colonoscopy by Dr. Henrene Pastor at the end of 2016  2. Genetics -Due to her family history of colon cancer, and her daughters multifocal polyps, I recommend her to have a genetic counseling. Her daughter met our genetic counselor before and genetic testing was negative. -She declined genetic testing at this point.  3. Anemia, secondary to surgery and possible iron deficiency -Her anemia has improved, hemoglobin 11.1 today -serum iron and ferritin are normal today -Continue oral iron supplement.   Follow-up with her primary care physician for other medical issues.  Plan return to clinic in 3 month with labs and CT scan    All questions were answered. The patient knows to call the clinic with any problems, questions or concerns. I  spent 20 minutes counseling the patient face to face. The total time spent in the appointment was 25 minutes and more than 50% was on counseling.     Truitt Merle, MD 01/24/2015 5:06 PM

## 2015-01-25 LAB — CEA: CEA: 1.8 ng/mL (ref 0.0–5.0)

## 2015-02-08 ENCOUNTER — Telehealth: Payer: Self-pay | Admitting: *Deleted

## 2015-02-08 NOTE — Telephone Encounter (Signed)
Attempted to call pt for results of lab tests unsuccessfully.  Voice mail box full, and unable to leave messages.  Called daughter [name] at home and left message on voice mail re: pt's  CEA  1.8 , and Iron study level - both normal as per md.  Asked [name] to relay message to pt.

## 2015-02-27 ENCOUNTER — Other Ambulatory Visit: Payer: Self-pay | Admitting: Internal Medicine

## 2015-04-17 ENCOUNTER — Other Ambulatory Visit: Payer: Self-pay | Admitting: Internal Medicine

## 2015-04-25 ENCOUNTER — Other Ambulatory Visit: Payer: Commercial Managed Care - HMO

## 2015-04-26 ENCOUNTER — Encounter (HOSPITAL_COMMUNITY): Payer: Self-pay

## 2015-04-26 ENCOUNTER — Ambulatory Visit (HOSPITAL_COMMUNITY)
Admission: RE | Admit: 2015-04-26 | Discharge: 2015-04-26 | Disposition: A | Discharge status: Home / Self Care | Payer: Commercial Managed Care - HMO | Source: Ambulatory Visit | Attending: Hematology | Admitting: Hematology

## 2015-04-26 ENCOUNTER — Other Ambulatory Visit (HOSPITAL_BASED_OUTPATIENT_CLINIC_OR_DEPARTMENT_OTHER): Payer: Commercial Managed Care - HMO

## 2015-04-26 ENCOUNTER — Other Ambulatory Visit: Payer: Self-pay | Admitting: Orthopedic Surgery

## 2015-04-26 DIAGNOSIS — C189 Malignant neoplasm of colon, unspecified: Secondary | ICD-10-CM | POA: Diagnosis present

## 2015-04-26 DIAGNOSIS — C182 Malignant neoplasm of ascending colon: Secondary | ICD-10-CM | POA: Diagnosis not present

## 2015-04-26 DIAGNOSIS — Z9889 Other specified postprocedural states: Secondary | ICD-10-CM | POA: Insufficient documentation

## 2015-04-26 DIAGNOSIS — M545 Low back pain: Secondary | ICD-10-CM

## 2015-04-26 LAB — CBC WITH DIFFERENTIAL/PLATELET
BASO%: 0.3 % (ref 0.0–2.0)
Basophils Absolute: 0 10*3/uL (ref 0.0–0.1)
EOS%: 1.3 % (ref 0.0–7.0)
Eosinophils Absolute: 0.1 10*3/uL (ref 0.0–0.5)
HCT: 38.2 % (ref 34.8–46.6)
HGB: 12.3 g/dL (ref 11.6–15.9)
LYMPH%: 22.7 % (ref 14.0–49.7)
MCH: 29 pg (ref 25.1–34.0)
MCHC: 32.2 g/dL (ref 31.5–36.0)
MCV: 90.1 fL (ref 79.5–101.0)
MONO#: 0.7 10*3/uL (ref 0.1–0.9)
MONO%: 10 % (ref 0.0–14.0)
NEUT#: 4.4 10*3/uL (ref 1.5–6.5)
NEUT%: 65.7 % (ref 38.4–76.8)
Platelets: 156 10*3/uL (ref 145–400)
RBC: 4.24 10*6/uL (ref 3.70–5.45)
RDW: 13.8 % (ref 11.2–14.5)
WBC: 6.8 10*3/uL (ref 3.9–10.3)
lymph#: 1.5 10*3/uL (ref 0.9–3.3)

## 2015-04-26 LAB — COMPREHENSIVE METABOLIC PANEL (CC13)
ALT: 18 U/L (ref 0–55)
AST: 29 U/L (ref 5–34)
Albumin: 3.9 g/dL (ref 3.5–5.0)
Alkaline Phosphatase: 79 U/L (ref 40–150)
Anion Gap: 10 mEq/L (ref 3–11)
BUN: 36.6 mg/dL — ABNORMAL HIGH (ref 7.0–26.0)
CO2: 23 mEq/L (ref 22–29)
Calcium: 9.6 mg/dL (ref 8.4–10.4)
Chloride: 106 mEq/L (ref 98–109)
Creatinine: 1.4 mg/dL — ABNORMAL HIGH (ref 0.6–1.1)
EGFR: 36 mL/min/{1.73_m2} — ABNORMAL LOW (ref >90)
Glucose: 95 mg/dl (ref 70–140)
Potassium: 4.7 mEq/L (ref 3.5–5.1)
Sodium: 139 mEq/L (ref 136–145)
Total Bilirubin: 0.52 mg/dL (ref 0.20–1.20)
Total Protein: 7.7 g/dL (ref 6.4–8.3)

## 2015-04-26 LAB — CEA: CEA: 1.8 ng/mL (ref 0.0–5.0)

## 2015-04-26 MED ORDER — IOHEXOL 300 MG/ML  SOLN
80.0000 mL | Freq: Once | INTRAMUSCULAR | Status: AC | PRN
  Administered 2015-04-26: 80 mL via INTRAVENOUS

## 2015-04-28 ENCOUNTER — Ambulatory Visit (HOSPITAL_BASED_OUTPATIENT_CLINIC_OR_DEPARTMENT_OTHER): Payer: Commercial Managed Care - HMO | Admitting: Hematology

## 2015-04-28 ENCOUNTER — Telehealth: Payer: Self-pay | Admitting: Hematology

## 2015-04-28 ENCOUNTER — Encounter: Payer: Self-pay | Admitting: Hematology

## 2015-04-28 VITALS — BP 154/58 | HR 61 | Temp 96.9°F | Resp 18 | Ht 63.0 in | Wt 180.4 lb

## 2015-04-28 DIAGNOSIS — C182 Malignant neoplasm of ascending colon: Secondary | ICD-10-CM | POA: Diagnosis not present

## 2015-04-28 DIAGNOSIS — R109 Unspecified abdominal pain: Secondary | ICD-10-CM

## 2015-04-28 DIAGNOSIS — I2510 Atherosclerotic heart disease of native coronary artery without angina pectoris: Secondary | ICD-10-CM

## 2015-04-28 NOTE — Telephone Encounter (Signed)
Gave adn prnted appt sched and avs for pt for feb 2017

## 2015-04-28 NOTE — Progress Notes (Signed)
[place]  [phone] [phone]  Clinic Follow Up Note   Patient Care Team: Marletta Lor, MD as PCP - General (Internal Medicine) 04/28/2015  CHIEF COMPLAINTS:  Follow up multifocal colon cancer    Adenocarcinoma, colon ([place])   06/02/2014 Initial Diagnosis Adenocarcinoma, colon. she presented with anemia, nauseam anorexia and abdominal pain and was admitted [facility]2014.   06/02/2014 Surgery exploratory laparotomy, right hemicolectomy, and descending colostomy by Dr. Georgette Dover   06/02/2014 Pathologic Stage multifocal (2) colon cancer at  ileocecal valve and ascending colon.,  mT4N1Mx, at least stage IIIB, grade 1, MMR normal, surgical margins negative.    06/02/2014 Tumor Marker CEA 1.7   11/24/2014 Surgery SIGMOID COLECTOMY AND COLSOTOMY CLOSURE with Donnie Mesa     HISTORY OF PRESENTING ILLNESS:  [name] 81 y.o. female is here because of recently diagnosed and resected multifocal colon cancer. I saw him when she was admitted to the hospital, and she is here with her family members for follow-up.  She has a history of chronic anemia and colovaginal fistula as well as diverticulosis with colonic polyps since 2008, was admitted to [facility] on 05/31/2014 With progressive abdominal pain, worse over the last couple of weeks, accompanied by nausea and vomiting, without weight loss. Workup included a CT of the abdomen and pelvis with contrast, which revealed acute small bowel obstruction due to what appears to be a soft tissue mass (apple core lesion) at the ileocecal valve suspicious for intestinal adenocarcinoma.This mass measures 6 x 4 cm. Up to 8 mm stable pericecal lymph nodes were visualized. No liver or distant metastasis identified. Small volume free fluid in the abdomen and pelvis, Likely reactive, was seen. No free air.  She underwent exploratory laparotomy, right hemicolectomy, and descending colostomy by Dr. Georgette Dover  on 06/02/2014. Grossly, there are two tumors identified. The larger tumor is located at the ileocecal valve and consists of an 8.5 cm span of moderately differentiated adenocarcinoma that involves the serosa. This tumor has associated perineural and lymphovascular invasion. The second discontiguous tumor is present in the proximal right colon and spans 4.0 cm and consists of well differentiated adenocarcinoma that is morphologically dissimilar from the larger tumor. This tumor extends into the muscularis propria.Her CEA is 1.7. It is stage mT4, N1b. immunohistochemical stains are normal, with very low probability of microsatellite instability (See details below ).  She was discharged to rehabilitation after her surgery. She has been doing well at to rehabilitation. She had a surgical wound infection a few weeks after surgery, which was treated with oral antibiotics and wound care. She was not very physically active before the surgery due to her multiple arthritis, she is recovering well at the nursing home and is likely going home in a few days. She is able to move around with a walker, has good appetite, surgical site pain is controlled, no nausea or other complaints. She did developed some UTI symptoms, and started Cipro and probiotics yesterday at the nursing home.  INTERIM HISTORY She returns for follow up, she came into clinic in a wheelchair with her daughter today. She still has loose bowel movement 2-4 times a day, no nausea or abdominal bloating. She has been having some abdominal pain in the right lower quadrant and epigastric area in the past few months, mild and intermittent, no fever or chills. Her appetite and eating is well. She is not very active due to the abdominal pain and her age.  MEDICAL HISTORY:  Past Medical History  Diagnosis Date  . CAD (coronary artery disease)     BMS x2 to RCA 10/2007  . Essential hypertension   . Diastolic CHF, chronic ([place])   . History of TIA (transient  ischemic attack)   . Hyperlipidemia   . Depression   . COPD with chronic bronchitis ([place])   . GERD (gastroesophageal reflux disease)   . History of hiatal hernia   . History of DVT of lower extremity   . Sigmoid diverticulosis   . Colovaginal fistula   . History of adenomatous polyp of colon   . Renal insufficiency   . OA (osteoarthritis)   . DJD (degenerative joint disease)   . Wears glasses   . Wears dentures   . Colostomy in place [facility])   . Peripheral neuropathy ([place])   . Adenocarcinoma, colon [facility]) Oncologist-- Dr Truitt Merle    Multifocal (2) Colon cancer @ ileocecal valve and ascending ( mT4N1Mx), Stage IIIB, grade I, MMR normal , 2 of 16 +lymph nodes, negative surgical margins---  06-02-2014  Right hemicolectomy w/ colostomy  . Chronic anemia   . History of blood transfusion     SURGICAL HISTORY: Past Surgical History  Procedure Laterality Date  . Appendectomy    . Cholecystectomy    . Abdominal hysterectomy    . Lumbar laminectomy  10-29-2002,  1969    Left  L3 -- 4  decompression  . Total knee arthroplasty Bilateral left 1994/  right 2000  . Rotator cuff repair Bilateral   . Cataract extraction w/ intraocular lens  implant, bilateral Bilateral   . Tonsillectomy    . Dilation and curettage of uterus    . Patellectomy Right 09/14/2013    Procedure: PATELLECTOMY;  Surgeon: Meredith Pel, MD;  Location: [place];  Service: Orthopedics;  Laterality: Right;  . Partial colectomy N/A 06/02/2014    Procedure: RIGHT PARTIAL COLECTOMY ;  Surgeon: Donnie Mesa, MD;  Location: [place];  Service: General;  Laterality: N/A;  . Colostomy N/A 06/02/2014    Procedure: DIVERTING DESCENDING END COLOSTOMY;  Surgeon: Donnie Mesa, MD;  Location: [place];  Service: General;  Laterality: N/A;  . Revision total knee arthroplasty Bilateral right  04-02-2011/  left 1996 & 10-05-1999  . Anterior cervical decomp/discectomy fusion  01-31-2009    C5 -- 7  . Cardiovascular stress test  02-26-2011     Normal lexiscan no exercise study/  no ischemia/  normal LV function and wall motion, ef 67%  . Transthoracic echocardiogram  12-01-2009    Grade I diastolic dysfunction/  ef 60%/  moderate MR/  mild TR  . Evaluation under anesthesia with anal fistulectomy N/A 10/13/2014    Procedure: ANAL EXAM UNDER ANESTHESIA ;  Surgeon: [name], MD;  Location: [facility];  Service: General;  Laterality: N/A;  . Proctoscopy N/A 10/13/2014    Procedure: RIDGE PROCTOSCOPY;  Surgeon: [name], MD;  Location: [facility];  Service: General;  Laterality: N/A;  . Coronary angioplasty with stent placement  11-04-2007  dr [email or web address]    BMS x2 to RCA/  mild Non-obstructive disease LAD/  normal LVF  . Bone spurs Bilateral     Feet  . Hand surgery      Tendon repair  . Carpal tunnel release Right   . Laparoscopic sigmoid colectomy N/A 11/24/2014    Procedure: SIGMOID COLECTOMY AND COLSOTOMY CLOSURE;  Surgeon: Donnie Mesa, MD;  Location: [place];  Service: General;  Laterality: N/A;  SOCIAL HISTORY: Social History   Social History  . Marital Status: Married    Spouse Name: N/A  . Number of Children: N/A  . Years of Education: N/A   Occupational History  . Not on file.   Social History Main Topics  . Smoking status: Former Smoker -- 0.50 packs/day for 15 years    Types: Cigarettes    Quit date: 07/16/1967  . Smokeless tobacco: Never Used  . Alcohol Use: No  . Drug Use: No  . Sexual Activity: Not on file   Other Topics Concern  . Not on file   Social History Narrative   Married, 2 children.  As of November 2015 her husband has been living in a nursing home for tendon after years as he suffers from multiple medical problems.   Patient does not drink alcohol nor does she smoke or chew tobacco products   Right handed   8th grade   1 cup daily   She worked for [employer] for 4 years.    FAMILY HISTORY: Family History  Problem Relation Age of Onset  . Osteoarthritis Mother   . Heart failure  Mother   . Cancer Sister 5    ? colon cancer   . Cancer Brother 30    Kidney cancer   . Cancer Maternal Uncle 54    Colon cancer     ALLERGIES:  has No Known Allergies.  MEDICATIONS:  Current Outpatient Prescriptions  Medication Sig Dispense Refill  . acetaminophen (TYLENOL) 325 MG tablet Take 2 tablets (650 mg total) by mouth every 6 (six) hours as needed for mild pain (or Fever >/= 101).    Marland Kitchen alum & mag hydroxide-simeth (MAALOX/MYLANTA) 200-200-20 MG/5ML suspension Take 30 mLs by mouth every 6 (six) hours as needed for indigestion, heartburn or flatulence.    . Ascorbic Acid (VITAMIN C) 500 MG CAPS Take 1 tablet by mouth daily.     . benazepril (LOTENSIN) 40 MG tablet TAKE 1 TABLET EVERY DAY 90 tablet 1  . cholecalciferol (VITAMIN D) 1000 UNITS tablet Take 1,000 Units by mouth daily.    . ferrous sulfate 325 (65 FE) MG tablet Take 1 tablet (325 mg total) by mouth 2 (two) times daily with a meal. (Patient taking differently: Take 325 mg by mouth daily with breakfast. )  3  . gabapentin (NEURONTIN) 600 MG tablet TAKE 1 TABLET THREE TIMES DAILY 270 tablet 3  . Hydrocodone-Acetaminophen 5-300 MG TABS Take 1 tablet by mouth as needed.    . hydroxypropyl methylcellulose / hypromellose (ISOPTO TEARS / GONIOVISC) 2.5 % ophthalmic solution Place 1 drop into both eyes as needed for dry eyes.    Marland Kitchen LORazepam (ATIVAN) 0.5 MG tablet Take 1 tablet (0.5 mg total) by mouth 2 (two) times daily as needed for anxiety or sleep. 60 tablet 5  . metoprolol (LOPRESSOR) 50 MG tablet Take 50 mg by mouth 2 (two) times daily.    . Multiple Vitamins-Minerals (MULTIVITAMIN WITH MINERALS) tablet Take 1 tablet by mouth daily.     Marland Kitchen oxyCODONE-acetaminophen (PERCOCET) 10-325 MG per tablet Take 1 tablet by mouth as needed.    . pantoprazole (PROTONIX) 40 MG tablet Take 40 mg by mouth daily.    . promethazine (PHENERGAN) 12.5 MG tablet Take 1 tablet by mouth every 6 (six) hours as needed. Nausea/vomitting    . senna  (SENOKOT) 8.6 MG TABS tablet Take 1 tablet by mouth daily as needed for mild constipation.    . sertraline (ZOLOFT)  50 MG tablet TAKE 1 TABLET EVERY DAY 90 tablet 1  . simvastatin (ZOCOR) 40 MG tablet Take 1 tablet (40 mg total) by mouth at bedtime. 90 tablet 3  . traZODone (DESYREL) 50 MG tablet Take 50 mg by mouth at bedtime.     . triamcinolone cream (KENALOG) 0.1 % Apply 1 application topically 2 (two) times daily as needed (Eczema on legs).    . nitroGLYCERIN (NITROSTAT) 0.4 MG SL tablet Place 0.4 mg under the tongue every 5 (five) minutes as needed for chest pain.     . traMADol (ULTRAM) 50 MG tablet Take 1 tablet by mouth as needed.     No current facility-administered medications for this visit.    REVIEW OF SYSTEMS:   Constitutional: Denies fevers, chills or abnormal night sweats Eyes: Denies blurriness of vision, double vision or watery eyes Ears, nose, mouth, throat, and face: Denies mucositis or sore throat Respiratory: Denies cough, dyspnea or wheezes Cardiovascular: Denies palpitation, chest discomfort or lower extremity swelling Gastrointestinal:  Denies nausea, heartburn or change in bowel habits Skin: Denies abnormal skin rashes Lymphatics: Denies new lymphadenopathy or easy bruising Neurological:Denies numbness, tingling or new weaknesses Behavioral/Psych: Mood is stable, no new changes  All other systems were reviewed with the patient and are negative.  PHYSICAL EXAMINATION: ECOG PERFORMANCE STATUS: 2-3  Filed Vitals:   04/28/15 1421  BP: 154/58  Pulse: 61  Temp: 96.9 F (36.1 C)  Resp: 18   Filed Weights   04/28/15 1421  Weight: 180 lb 6.4 oz (81.829 kg)    GENERAL:alert, no distress and comfortable SKIN: skin color, texture, turgor are normal, no rashes or significant lesions EYES: normal, conjunctiva are pink and non-injected, sclera clear OROPHARYNX:no exudate, no erythema and lips, buccal mucosa, and tongue normal  NECK: supple, thyroid normal size,  non-tender, without nodularity LYMPH:  no palpable lymphadenopathy in the cervical, axillary or inguinal LUNGS: clear to auscultation and percussion with normal breathing effort HEART: regular rate & rhythm and no murmurs and no lower extremity edema ABDOMEN:abdomen soft, the midline surgical wound is well healed, appears clean. Mild tenderness at RLQ  and normal bowel sounds Musculoskeletal:no cyanosis of digits and no clubbing  PSYCH: alert & oriented x 3 with fluent speech NEURO: no focal motor/sensory deficits.   LABORATORY DATA:  I have reviewed the data as listed CBC Latest Ref Rng 04/26/2015 01/24/2015 12/07/2014  WBC 3.9 - 10.3 10e3/uL 6.8 6.8 6.4  Hemoglobin 11.6 - 15.9 g/dL 12.3 11.1(L) 8.6(A)  Hematocrit 34.8 - 46.6 % 38.2 33.5(L) 26(A)  Platelets 145 - 400 10e3/uL 156 166 285    CMP Latest Ref Rng 04/26/2015 01/24/2015 12/07/2014  Glucose 70 - 140 mg/dl 95 100 -  BUN 7.0 - 26.0 mg/dL 36.6(H) 36.7(H) 14  Creatinine 0.6 - 1.1 mg/dL 1.4(H) 1.2(H) 1.0  Sodium 136 - 145 mEq/L 139 140 139  Potassium 3.5 - 5.1 mEq/L 4.7 4.9 4.1  Chloride 101 - 111 mmol/L - - -  CO2 22 - 29 mEq/L 23 26 -  Calcium 8.4 - 10.4 mg/dL 9.6 9.2 -  Total Protein 6.4 - 8.3 g/dL 7.7 7.1 -  Total Bilirubin 0.20 - 1.20 mg/dL 0.52 0.32 -  Alkaline Phos 40 - 150 U/L 79 65 -  AST 5 - 34 U/L 29 24 -  ALT 0 - 55 U/L 18 17 -   CEA (Order [id number])      CEA  Status: Finalresult Visible to patient:  Not Released Nextappt: Today at 01:45  PM in Oncology Burr Medico, Krista Blue, MD) Dx:  Adenocarcinoma, colon [facility])           Ref Range 2d ago  57mo ago  64mo ago     CEA 0.0 - 5.0 ng/mL 1.8 1.8 1.5          Results for YULIETH, CARRENDER (MRN [id number]) as of 01/24/2015 17:09  Ref. Range 01/24/2015 10:27  Iron Latest Ref Range: 41-142 ug/dL 47  UIBC Latest Ref Range: 120-384 ug/dL 256  TIBC Latest Ref Range: 236-444 ug/dL 303  %SAT Latest Ref Range: 21-57 % 15 (L)  Ferritin Latest Ref Range: 9-269  ng/ml 50    Pathology report: 06/02/2014 Colon, segmental resection for tumor, right - MULTIFOCAL INVASIVE ADENOCARCINOMA, SPANNING 8.5 CM AND 4.0 CM. - ADENOCARCINOMA INVOLVES THE SEROSA. - LYMPHOVASCULAR INVASION IS IDENTIFIED. 1 of 4 FINAL for Hehir, Raseel C ([id number]) Diagnosis(continued) - PERINEURAL INVASION IS IDENTIFIED. - METASTATIC ADENOCARCINOMA IN 2 OF 16 LYMPH NODES (2/16) WITH EXTRACAPSULAR EXTENSION. - THE SURGICAL RESECTION MARGINS ARE NEGATIVE FOR ADENOCARCINOMA. - SEE ONCOLOGY TABLE BELOW. Microscopic Comment COLON AND RECTUM (INCLUDING TRANS-ANAL RESECTION): Specimen: Terminal ileum and right colon Procedure: Resection Tumor site: Ileocecal valve (tumor #1) and proximal mid ascending colon (tumor #2) Specimen integrity: Intact Macroscopic intactness of mesorectum: N/A Macroscopic tumor perforation: Not identified Invasive tumor: Tumor #1 (ileocecal valve) Maximum size: 8.5 cm (gross measurement) Histologic type(s): Adenocarcinoma Histologic grade and differentiation: G2: moderately differentiated/low grade Type of polyp in which invasive carcinoma arose: Likely tubular adenoma Microscopic extension of invasive tumor: Adenocarcinoma involves the serosa Invasive tumor: Tumor #2 (proximal mid ascending colon) Maximum size: 4.0 cm (gross measurement) Histologic type(s): Adenocarcinoma Histologic grade and differentiation: G1: well differentiated/low grade Type of polyp in which invasive carcinoma arose: Tubulovillous adenoma Microscopic extension of invasive tumor: Adenocarcinoma extends into the muscularis propria Lymph-Vascular invasion: Present Peri-neural invasion: Present Tumor deposit(s) (discontinuous extramural extension): Not identified Resection margins: Proximal margin: 32.0 cm Distal margin: 7.5 cm Circumferential (radial) (posterior ascending, posterior descending; lateral and posterior mid-rectum; and entire lower 1/3 rectum): 3.0 cm Treatment  effect (neo-adjuvant therapy): N/A Additional polyp(s): Not identified Non-neoplastic findings: No significant findings Lymph nodes: number examined 16; number positive: 2 Pathologic Staging: mT4, N1b Ancillary studies: MSI testing will be performed on the larger higher grade tumor and the results reported separately. Additional studies can be performed upon clinician request. Comments: Grossly, there are two tumors identified. The larger tumor is located at the ileocecal valve and consists of an 8.5 cm span of moderately differentiated adenocarcinoma that involves the serosa. This tumor has associated perineural and lymphovascular invasion. The second discontiguous tumor is present in the proximal right colon and spans 4.0 cm and consists of well differentiated adenocarcinoma that is morphologically dissimilar from the larger tumor. This tumor extends into the muscularis propria. Enid Cutter MD Pathologist, Electronic Signature (Case signed 06/06/2014) Specimen Gross and Clinical Information   RADIOGRAPHIC STUDIES: I have personally reviewed the radiological images as listed and agreed with the findings in the report.  Ct abdomen and pelvis with contrast 04/26/2015 IMPRESSION: Postsurgical changes related to partial right hemicolectomy and partial left hemicolectomy. No evidence of bowel obstruction.  No findings to suggest recurrent or metastatic disease.  Prior rectovaginal fistulas is not apparent on the current study.  Additional ancillary findings as above.  ASSESSMENT & PLAN:  81 year old Caucasian female, with multiple medical comorbidities, presented with anemia, abdominal pain and nausea. She underwent exploratory laparotomy, right hemicolectomy, and descending colostomy by Dr. Georgette Dover  on 06/02/2014. Surgical path reviewed multifocal (2) colon cancer at  ileocecal valve and ascending colon.  1. Multifocal invasive colon adenocarcinoma, mT4N1Mx, at least stage IIIB, grade  1, MMR normal. -I reviewed her surgical findings and her CT of abdomen and pelvis with patient and her extended family members in great detail today. Both of her colon cancer were completely surgically resected, however she does have high risk of cancer recurrence in the future due to locally advanced stage.  -I discussed her recent CT scan from 04/26/2015, which showed no evidence of recurrence. Her tumor marker CEA is also normal,. -I reviewed her lab today. Her anemia has resolved. She has slightly elevated creatinine, I suggest her to follow-up with her care physician.  Burnis Medin continue surveillance. I'll see her back in 4 months with lab. -She will have a colonoscopy by Dr. Henrene Pastor at the end of 2016, I encouraged her to call earlier, especially giving her abdominal pain.   2. Genetics -Due to her family history of colon cancer, and her daughters multifocal polyps, I recommend her to have a genetic counseling. Her daughter met our genetic counselor before and genetic testing was negative. -She declined genetic testing at this point.  3. Anemia, secondary to surgery and possible iron deficiency -Her anemia has resolved, hemoglobin 12.7 today -serum iron and ferritin are normal  -Continue oral iron supplement.   4.abdominal pain  -Unclear etiology. Possibly still related to her surgery. No clinical signs of bowel obstruction. -I encouraged her to follow-up with gastroenterologist Dr. Henrene Pastor  Follow-up with her primary care physician for other medical issues.  Plan return to clinic in 4 month with labs    All questions were answered. The patient knows to call the clinic with any problems, questions or concerns. I spent 20 minutes counseling the patient face to face. The total time spent in the appointment was 25 minutes and more than 50% was on counseling.     Truitt Merle, MD 04/28/2015 11:08 PM

## 2015-05-01 ENCOUNTER — Telehealth: Payer: Self-pay | Admitting: Internal Medicine

## 2015-05-01 ENCOUNTER — Encounter: Payer: Self-pay | Admitting: Internal Medicine

## 2015-05-01 MED ORDER — METOPROLOL TARTRATE 50 MG PO TABS: 50.0000 mg | ORAL_TABLET | Freq: Two times a day (BID) | ORAL | Status: DC

## 2015-05-01 NOTE — Telephone Encounter (Signed)
Left message on voicemail Rx sent to [facility] as requested.

## 2015-05-01 NOTE — Telephone Encounter (Signed)
Pt needs refill on metoprolol  50 mg #90 wrefills sent to [facility]

## 2015-05-02 ENCOUNTER — Ambulatory Visit: Payer: Commercial Managed Care - HMO | Admitting: Hematology

## 2015-05-12 ENCOUNTER — Other Ambulatory Visit: Payer: Self-pay | Admitting: Internal Medicine

## 2015-05-12 ENCOUNTER — Ambulatory Visit
Admission: RE | Admit: 2015-05-12 | Discharge: 2015-05-12 | Disposition: A | Discharge status: Home / Self Care | Payer: Commercial Managed Care - HMO | Source: Ambulatory Visit | Attending: Orthopedic Surgery | Admitting: Orthopedic Surgery

## 2015-05-12 DIAGNOSIS — M545 Low back pain: Secondary | ICD-10-CM

## 2015-05-17 ENCOUNTER — Encounter: Payer: Self-pay | Admitting: Internal Medicine

## 2015-05-17 ENCOUNTER — Ambulatory Visit (INDEPENDENT_AMBULATORY_CARE_PROVIDER_SITE_OTHER): Payer: Commercial Managed Care - HMO | Admitting: Internal Medicine

## 2015-05-17 VITALS — BP 140/80 | HR 64 | Temp 98.2°F | Resp 20 | Ht 63.0 in | Wt 177.0 lb

## 2015-05-17 DIAGNOSIS — I10 Essential (primary) hypertension: Secondary | ICD-10-CM | POA: Diagnosis not present

## 2015-05-17 DIAGNOSIS — I2510 Atherosclerotic heart disease of native coronary artery without angina pectoris: Secondary | ICD-10-CM

## 2015-05-17 DIAGNOSIS — Z23 Encounter for immunization: Secondary | ICD-10-CM | POA: Diagnosis not present

## 2015-05-17 DIAGNOSIS — I5032 Chronic diastolic (congestive) heart failure: Secondary | ICD-10-CM

## 2015-05-17 DIAGNOSIS — E7800 Pure hypercholesterolemia, unspecified: Secondary | ICD-10-CM | POA: Diagnosis not present

## 2015-05-17 MED ORDER — TRAMADOL HCL 50 MG PO TABS: 50.0000 mg | ORAL_TABLET | ORAL | Status: DC | PRN

## 2015-05-17 MED ORDER — HYDROCODONE-ACETAMINOPHEN 5-325 MG PO TABS: 1.0000 | ORAL_TABLET | Freq: Four times a day (QID) | ORAL | Status: DC | PRN

## 2015-05-17 MED ORDER — LORAZEPAM 0.5 MG PO TABS: 0.5000 mg | ORAL_TABLET | Freq: Two times a day (BID) | ORAL | Status: DC | PRN

## 2015-05-17 NOTE — Patient Instructions (Signed)
Limit your sodium (Salt) intake  Please check your blood pressure on a regular basis.  If it is consistently greater than 150/90, please make an office appointment.  Follow general surgery and orthopedics  Return in 6 months for follow-up

## 2015-05-17 NOTE — Progress Notes (Signed)
Subjective:    Patient ID: [name], female    DOB: 23-Oct-1929, 82 y.o.   MRN: [id number]  HPI  81 year old patient who is seen today for follow-up.  She  recently had general surgery follow-up for multifocal: Carcinoma.  She has essential hypertension and coronary artery disease which has been stable.  She has osteoarthritis in her chief complaint is significant lumbar and leg pain.  She has been evaluated by orthopedics.  Remains on analgesics. Recent MRI reviewed that revealed multifocal degenerative changes  Otherwise, doing well  Wt Readings from Last 3 Encounters:  05/17/15 177 lb (80.287 kg)  04/28/15 180 lb 6.4 oz (81.829 kg)  01/24/15 172 lb 12.8 oz (78.382 kg)   Past Medical History  Diagnosis Date  . CAD (coronary artery disease)     BMS x2 to RCA 10/2007  . Essential hypertension   . Diastolic CHF, chronic ([place])   . History of TIA (transient ischemic attack)   . Hyperlipidemia   . Depression   . COPD with chronic bronchitis ([place])   . GERD (gastroesophageal reflux disease)   . History of hiatal hernia   . History of DVT of lower extremity   . Sigmoid diverticulosis   . Colovaginal fistula   . History of adenomatous polyp of colon   . Renal insufficiency   . OA (osteoarthritis)   . DJD (degenerative joint disease)   . Wears glasses   . Wears dentures   . Colostomy in place [facility])   . Peripheral neuropathy ([place])   . Adenocarcinoma, colon [facility]) Oncologist-- Dr Truitt Merle    Multifocal (2) Colon cancer @ ileocecal valve and ascending ( mT4N1Mx), Stage IIIB, grade I, MMR normal , 2 of 16 +lymph nodes, negative surgical margins---  06-02-2014  Right hemicolectomy w/ colostomy  . Chronic anemia   . History of blood transfusion     Social History   Social History  . Marital Status: Married    Spouse Name: N/A  . Number of Children: N/A  . Years of Education: N/A   Occupational History  . Not on file.   Social History Main Topics  . Smoking status: Former  Smoker -- 0.50 packs/day for 15 years    Types: Cigarettes    Quit date: 07/16/1967  . Smokeless tobacco: Never Used  . Alcohol Use: No  . Drug Use: No  . Sexual Activity: Not on file   Other Topics Concern  . Not on file   Social History Narrative   Married, 2 children.  As of November 2015 her husband has been living in a nursing home for tendon after years as he suffers from multiple medical problems.   Patient does not drink alcohol nor does she smoke or chew tobacco products   Right handed   8th grade   1 cup daily    Past Surgical History  Procedure Laterality Date  . Appendectomy    . Cholecystectomy    . Abdominal hysterectomy    . Lumbar laminectomy  10-29-2002,  1969    Left  L3 -- 4  decompression  . Total knee arthroplasty Bilateral left 1994/  right 2000  . Rotator cuff repair Bilateral   . Cataract extraction w/ intraocular lens  implant, bilateral Bilateral   . Tonsillectomy    . Dilation and curettage of uterus    . Patellectomy Right 09/14/2013    Procedure: PATELLECTOMY;  Surgeon: Meredith Pel, MD;  Location: [place];  Service: Orthopedics;  Laterality: Right;  . Partial colectomy N/A 06/02/2014    Procedure: RIGHT PARTIAL COLECTOMY ;  Surgeon: Donnie Mesa, MD;  Location: [place];  Service: General;  Laterality: N/A;  . Colostomy N/A 06/02/2014    Procedure: DIVERTING DESCENDING END COLOSTOMY;  Surgeon: Donnie Mesa, MD;  Location: [place];  Service: General;  Laterality: N/A;  . Revision total knee arthroplasty Bilateral right  04-02-2011/  left 1996 & 10-05-1999  . Anterior cervical decomp/discectomy fusion  01-31-2009    C5 -- 7  . Cardiovascular stress test  02-26-2011    Normal lexiscan no exercise study/  no ischemia/  normal LV function and wall motion, ef 67%  . Transthoracic echocardiogram  12-01-2009    Grade I diastolic dysfunction/  ef 60%/  moderate MR/  mild TR  . Evaluation under anesthesia with anal fistulectomy N/A 10/13/2014     Procedure: ANAL EXAM UNDER ANESTHESIA ;  Surgeon: [name], MD;  Location[facility];  Service: General;  Laterality: N/A;  . Proctoscopy N/A 10/13/2014    Procedure: RIDGE PROCTOSCOPY;  Surgeon: [name], MD;  Location[facility];  Service: General;  Laterality: N/A;  . Coronary angioplasty with stent placement  11-04-2007  dr bensimhon    BMS x2 to RCA/  mild Non-obstructive disease LAD/  normal LVF  . Bone spurs Bilateral     Feet  . Hand surgery      Tendon repair  . Carpal tunnel release Right   . Laparoscopic sigmoid colectomy N/A 11/24/2014    Procedure: SIGMOID COLECTOMY AND COLSOTOMY CLOSURE;  Surgeon: Donnie Mesa, MD;  Location[facility];  Service: General;  Laterality: N/A;    Family History  Problem Relation Age of Onset  . Osteoarthritis Mother   . Heart failure Mother   . Cancer Sister 17    ? colon cancer   . Cancer Brother 30    Kidney cancer   . Cancer Maternal Uncle 49    Colon cancer     No Known Allergies  Current Outpatient Prescriptions on File Prior to Visit  Medication Sig Dispense Refill  . acetaminophen (TYLENOL) 325 MG tablet Take 2 tablets (650 mg total) by mouth every 6 (six) hours as needed for mild pain (or Fever >/= 101).    Marland Kitchen alum & mag hydroxide-simeth (MAALOX/MYLANTA) 200-200-20 MG/5ML suspension Take 30 mLs by mouth every 6 (six) hours as needed for indigestion, heartburn or flatulence.    . Ascorbic Acid (VITAMIN C) 500 MG CAPS Take 1 tablet by mouth daily.     . benazepril (LOTENSIN) 40 MG tablet TAKE 1 TABLET EVERY DAY 90 tablet 1  . cholecalciferol (VITAMIN D) 1000 UNITS tablet Take 1,000 Units by mouth daily.    . ferrous sulfate 325 (65 FE) MG tablet Take 1 tablet (325 mg total) by mouth 2 (two) times daily with a meal. (Patient taking differently: Take 325 mg by mouth daily with breakfast. )  3  . gabapentin (NEURONTIN) 600 MG tablet TAKE 1 TABLET THREE TIMES DAILY 270 tablet 3  . hydroxypropyl methylcellulose / hypromellose (ISOPTO  TEARS / GONIOVISC) 2.5 % ophthalmic solution Place 1 drop into both eyes as needed for dry eyes.    . metoprolol (LOPRESSOR) 50 MG tablet Take 1 tablet (50 mg total) by mouth 2 (two) times daily. 180 tablet 1  . Multiple Vitamins-Minerals (MULTIVITAMIN WITH MINERALS) tablet Take 1 tablet by mouth daily.     . nitroGLYCERIN (NITROSTAT) 0.4 MG SL tablet Place 0.4 mg  under the tongue every 5 (five) minutes as needed for chest pain.     . pantoprazole (PROTONIX) 40 MG tablet Take 40 mg by mouth daily.    . promethazine (PHENERGAN) 12.5 MG tablet Take 1 tablet by mouth every 6 (six) hours as needed. Nausea/vomitting    . senna (SENOKOT) 8.6 MG TABS tablet Take 1 tablet by mouth daily as needed for mild constipation.    . sertraline (ZOLOFT) 50 MG tablet TAKE 1 TABLET EVERY DAY 90 tablet 1  . simvastatin (ZOCOR) 40 MG tablet Take 1 tablet (40 mg total) by mouth at bedtime. 90 tablet 3  . traZODone (DESYREL) 50 MG tablet Take 50 mg by mouth at bedtime.     . triamcinolone cream (KENALOG) 0.1 % Apply 1 application topically 2 (two) times daily as needed (Eczema on legs).     No current facility-administered medications on file prior to visit.    BP 140/80 mmHg  Pulse 64  Temp(Src) 98.2 F (36.8 C) (Oral)  Resp 20  Ht $R'5\' 3"'FL$  (1.6 m)  Wt 177 lb (80.287 kg)  BMI 31.36 kg/m2  SpO2 96%     Review of Systems  Constitutional: Negative.   HENT: Negative for congestion, dental problem, hearing loss, rhinorrhea, sinus pressure, sore throat and tinnitus.   Eyes: Negative for pain, discharge and visual disturbance.  Respiratory: Negative for cough and shortness of breath.   Cardiovascular: Negative for chest pain, palpitations and leg swelling.  Gastrointestinal: Negative for nausea, vomiting, abdominal pain, diarrhea, constipation, blood in stool and abdominal distention.  Genitourinary: Negative for dysuria, urgency, frequency, hematuria, flank pain, vaginal bleeding, vaginal discharge, difficulty  urinating, vaginal pain and pelvic pain.  Musculoskeletal: Positive for back pain and gait problem. Negative for joint swelling and arthralgias.  Skin: Negative for rash.  Neurological: Negative for dizziness, syncope, speech difficulty, weakness, numbness and headaches.  Hematological: Negative for adenopathy.  Psychiatric/Behavioral: Negative for behavioral problems, dysphoric mood and agitation. The patient is not nervous/anxious.        Objective:   Physical Exam  Constitutional: She is oriented to person, place, and time. She appears well-developed and well-nourished.  HENT:  Head: Normocephalic.  Right Ear: External ear normal.  Left Ear: External ear normal.  Mouth/Throat: Oropharynx is clear and moist.  Eyes: Conjunctivae and EOM are normal. Pupils are equal, round, and reactive to light.  Neck: Normal range of motion. Neck supple. No thyromegaly present.  Cardiovascular: Normal rate, regular rhythm, normal heart sounds and intact distal pulses.   Pulmonary/Chest: Effort normal and breath sounds normal.  Abdominal: Soft. Bowel sounds are normal. She exhibits no mass. There is no tenderness. There is no rebound and no guarding.  Obese soft and nontender  Musculoskeletal: Normal range of motion. She exhibits edema.  Lymphadenopathy:    She has no cervical adenopathy.  Neurological: She is alert and oriented to person, place, and time.  Skin: Skin is warm and dry. No rash noted.  Psychiatric: She has a normal mood and affect. Her behavior is normal.          Assessment & Plan:    Low back pain  Multilevel advanced degenerative disc disease.  Lumbar region  Hypertension, stable  Coronary artery disease, stable  History of colon cancer.  Recent abdominal pelvic CT scan reviewed   Follow general surgery and orthopedics   Preventive health.  Flu vaccine and Prevnar 13, dispensed  Recheck here 6 months

## 2015-05-17 NOTE — Progress Notes (Signed)
Pre visit review using our clinic review tool, if applicable. No additional management support is needed unless otherwise documented below in the visit note. 

## 2015-05-19 ENCOUNTER — Telehealth: Payer: Self-pay | Admitting: Internal Medicine

## 2015-05-19 NOTE — Telephone Encounter (Signed)
Spoke to [place], told her Dr.K said Lorazepam can not be increased while she is taking Hydrocodone and Tramadol. [name] verbalized understanding.

## 2015-05-19 NOTE — Telephone Encounter (Signed)
Would recommend that this medication NOT be increased while she is on narcotic analgesics

## 2015-05-19 NOTE — Telephone Encounter (Signed)
Daughter call to say Mom is not relaxing at night and when pt was in here last week she meant to ask Dr Raliegh Ip if he could increase the following med    LORazepam (ATIVAN) 0.5 MG tablet    Pharmacy [facility] [place]

## 2015-05-19 NOTE — Telephone Encounter (Signed)
Please see message and advise 

## 2015-06-07 ENCOUNTER — Telehealth: Payer: Self-pay | Admitting: Internal Medicine

## 2015-06-07 DIAGNOSIS — M25562 Pain in left knee: Secondary | ICD-10-CM

## 2015-06-07 NOTE — Telephone Encounter (Signed)
Cecille Rubin called from [place] saying she needs a Corporate treasurer for Ms. Pidgeon.  Provider: Dr. Maureen Ralphs Appointment: December 13th, 2016 @ 10:15am Dx: [id number], pain in left knee NPI: [id number]  Please call Cecille Rubin if you have questions: ph# [phone] ext [phone] Fax # [phone]  Thank You.

## 2015-06-07 NOTE — Telephone Encounter (Signed)
Order for referral to Ortho done. 

## 2015-06-12 ENCOUNTER — Telehealth: Payer: Self-pay | Admitting: Internal Medicine

## 2015-06-12 DIAGNOSIS — C182 Malignant neoplasm of ascending colon: Secondary | ICD-10-CM

## 2015-06-12 DIAGNOSIS — M25562 Pain in left knee: Secondary | ICD-10-CM

## 2015-06-12 NOTE — Telephone Encounter (Signed)
[name], please see message and I did orders for both referrals.

## 2015-06-12 NOTE — Telephone Encounter (Signed)
Ms. Mish called saying she received an Explanation of Benefits in the mail and was told [employer] wouldn't cover the appointment she had with Dr. Truitt Merle on 10.12.16 unless a referral is sent to the [place].  She also has an appointment with Dr. Hector Shade on 12.13.16 @ 10:15am and needs a referral for that as well. Please call Ms. Mcguiness if you have any questions or concerns.   Pt ph# [phone] Thank you.

## 2015-06-29 ENCOUNTER — Other Ambulatory Visit: Payer: Self-pay | Admitting: Internal Medicine

## 2015-07-04 ENCOUNTER — Ambulatory Visit (INDEPENDENT_AMBULATORY_CARE_PROVIDER_SITE_OTHER): Payer: Commercial Managed Care - HMO | Admitting: Adult Health

## 2015-07-04 ENCOUNTER — Encounter: Payer: Self-pay | Admitting: Adult Health

## 2015-07-04 VITALS — BP 150/100 | Temp 98.6°F | Ht 63.0 in | Wt 184.0 lb

## 2015-07-04 DIAGNOSIS — Z01818 Encounter for other preprocedural examination: Secondary | ICD-10-CM | POA: Diagnosis not present

## 2015-07-04 NOTE — Progress Notes (Signed)
Pre visit review using our clinic review tool, if applicable. No additional management support is needed unless otherwise documented below in the visit note. 

## 2015-07-04 NOTE — Progress Notes (Signed)
Subjective:    Patient ID: [name], female    DOB: [birth year], 81 y.o.   MRN: [id number]  HPI  81 year old female, patient of Dr. Raliegh Ip, who is new to me, she presents with her daughter for surgical clearance for left knee revision - both femoral and tibial components. She reports that this will be her 5th surgery on the left knee. She has also had 2 surgeries on the right knee.   She endorses feeling well. Denies any CP, SOB, fevers, chills. She has not felt dizzy or lightheaded recently. Has had one fall at the beginning of summer where she tripped and fell backwards while at the mailbox. Denies LOC or hitting her head.   She will be following up with Cardiology in January for clearance from them.     Review of Systems  HENT: Negative.   Eyes: Negative.   Respiratory: Negative.   Cardiovascular: Negative.   Gastrointestinal: Negative.   Endocrine: Negative.   Genitourinary: Negative.   Musculoskeletal: Positive for myalgias and arthralgias. Negative for back pain, joint swelling, gait problem, neck pain and neck stiffness.  Skin: Negative.   Allergic/Immunologic: Negative.   Neurological: Positive for weakness (in lower extremities).  Hematological: Negative.   Psychiatric/Behavioral: Negative.    Past Medical History  Diagnosis Date  . CAD (coronary artery disease)     BMS x2 to RCA 10/2007  . Essential hypertension   . Diastolic CHF, chronic ([place])   . History of TIA (transient ischemic attack)   . Hyperlipidemia   . Depression   . COPD with chronic bronchitis ([place])   . GERD (gastroesophageal reflux disease)   . History of hiatal hernia   . History of DVT of lower extremity   . Sigmoid diverticulosis   . Colovaginal fistula   . History of adenomatous polyp of colon   . Renal insufficiency   . OA (osteoarthritis)   . DJD (degenerative joint disease)   . Wears glasses   . Wears dentures   . Colostomy in place [facility])   . Peripheral neuropathy ([place])   .  Adenocarcinoma, colon [facility]) Oncologist-- Dr Truitt Merle    Multifocal (2) Colon cancer @ ileocecal valve and ascending ( mT4N1Mx), Stage IIIB, grade I, MMR normal , 2 of 16 +lymph nodes, negative surgical margins---  06-02-2014  Right hemicolectomy w/ colostomy  . Chronic anemia   . History of blood transfusion     Social History   Social History  . Marital Status: Married    Spouse Name: N/A  . Number of Children: N/A  . Years of Education: N/A   Occupational History  . Not on file.   Social History Main Topics  . Smoking status: Former Smoker -- 0.50 packs/day for 15 years    Types: Cigarettes    Quit date: 07/16/1967  . Smokeless tobacco: Never Used  . Alcohol Use: No  . Drug Use: No  . Sexual Activity: Not on file   Other Topics Concern  . Not on file   Social History Narrative   Married, 2 children.  As of November 2015 her husband has been living in a nursing home for tendon after years as he suffers from multiple medical problems.   Patient does not drink alcohol nor does she smoke or chew tobacco products   Right handed   8th grade   1 cup daily    Past Surgical History  Procedure Laterality Date  . Appendectomy    .  Cholecystectomy    . Abdominal hysterectomy    . Lumbar laminectomy  10-29-2002,  1969    Left  L3 -- 4  decompression  . Total knee arthroplasty Bilateral left 1994/  right 2000  . Rotator cuff repair Bilateral   . Cataract extraction w/ intraocular lens  implant, bilateral Bilateral   . Tonsillectomy    . Dilation and curettage of uterus    . Patellectomy Right 09/14/2013    Procedure: PATELLECTOMY;  Surgeon: Meredith Pel, MD;  Location: [place];  Service: Orthopedics;  Laterality: Right;  . Partial colectomy N/A 06/02/2014    Procedure: RIGHT PARTIAL COLECTOMY ;  Surgeon: Donnie Mesa, MD;  Location: [place];  Service: General;  Laterality: N/A;  . Colostomy N/A 06/02/2014    Procedure: DIVERTING DESCENDING END COLOSTOMY;  Surgeon: Donnie Mesa, MD;  Location: [place];  Service: General;  Laterality: N/A;  . Revision total knee arthroplasty Bilateral right  04-02-2011/  left 1996 & 10-05-1999  . Anterior cervical decomp/discectomy fusion  01-31-2009    C5 -- 7  . Cardiovascular stress test  02-26-2011    Normal lexiscan no exercise study/  no ischemia/  normal LV function and wall motion, ef 67%  . Transthoracic echocardiogram  12-01-2009    Grade I diastolic dysfunction/  ef 60%/  moderate MR/  mild TR  . Evaluation under anesthesia with anal fistulectomy N/A 10/13/2014    Procedure: ANAL EXAM UNDER ANESTHESIA ;  Surgeon: [name], MD;  [facility];  Service: General;  Laterality: N/A;  . Proctoscopy N/A 10/13/2014    Procedure: RIDGE PROCTOSCOPY;  Surgeon: [name], MD;  [facility];  Service: General;  Laterality: N/A;  . Coronary angioplasty with stent placement  11-04-2007  dr bensimhon    BMS x2 to RCA/  mild Non-obstructive disease LAD/  normal LVF  . Bone spurs Bilateral     Feet  . Hand surgery      Tendon repair  . Carpal tunnel release Right   . Laparoscopic sigmoid colectomy N/A 11/24/2014    Procedure: SIGMOID COLECTOMY AND COLSOTOMY CLOSURE;  Surgeon: Donnie Mesa, MD;  [facility];  Service: General;  Laterality: N/A;    Family History  Problem Relation Age of Onset  . Osteoarthritis Mother   . Heart failure Mother   . Cancer Sister 34    ? colon cancer   . Cancer Brother 30    Kidney cancer   . Cancer Maternal Uncle 73    Colon cancer     No Known Allergies  Current Outpatient Prescriptions on File Prior to Visit  Medication Sig Dispense Refill  . acetaminophen (TYLENOL) 325 MG tablet Take 2 tablets (650 mg total) by mouth every 6 (six) hours as needed for mild pain (or Fever >/= 101).    Marland Kitchen alum & mag hydroxide-simeth (MAALOX/MYLANTA) 200-200-20 MG/5ML suspension Take 30 mLs by mouth every 6 (six) hours as needed for indigestion, heartburn or flatulence.    . Ascorbic Acid  (VITAMIN C) 500 MG CAPS Take 1 tablet by mouth daily.     . benazepril (LOTENSIN) 40 MG tablet TAKE 1 TABLET EVERY DAY 90 tablet 1  . cholecalciferol (VITAMIN D) 1000 UNITS tablet Take 1,000 Units by mouth daily.    . ferrous sulfate 325 (65 FE) MG tablet Take 1 tablet (325 mg total) by mouth 2 (two) times daily with a meal. (Patient taking differently: Take 325 mg by mouth daily with breakfast. )  3  . gabapentin (NEURONTIN) 600 MG tablet TAKE 1 TABLET THREE TIMES DAILY 270 tablet 3  . HYDROcodone-acetaminophen (NORCO/VICODIN) 5-325 MG tablet Take 1 tablet by mouth every 6 (six) hours as needed for moderate pain. 90 tablet 0  . hydroxypropyl methylcellulose / hypromellose (ISOPTO TEARS / GONIOVISC) 2.5 % ophthalmic solution Place 1 drop into both eyes as needed for dry eyes.    Marland Kitchen LORazepam (ATIVAN) 0.5 MG tablet Take 1 tablet (0.5 mg total) by mouth 2 (two) times daily as needed for anxiety or sleep. 60 tablet 5  . metoprolol (LOPRESSOR) 50 MG tablet Take 1 tablet (50 mg total) by mouth 2 (two) times daily. 180 tablet 1  . Multiple Vitamins-Minerals (MULTIVITAMIN WITH MINERALS) tablet Take 1 tablet by mouth daily.     . nitroGLYCERIN (NITROSTAT) 0.4 MG SL tablet Place 0.4 mg under the tongue every 5 (five) minutes as needed for chest pain.     . pantoprazole (PROTONIX) 40 MG tablet Take 40 mg by mouth daily.    . promethazine (PHENERGAN) 12.5 MG tablet Take 1 tablet by mouth every 6 (six) hours as needed. Nausea/vomitting    . senna (SENOKOT) 8.6 MG TABS tablet Take 1 tablet by mouth daily as needed for mild constipation.    . sertraline (ZOLOFT) 50 MG tablet TAKE 1 TABLET EVERY DAY 90 tablet 1  . simvastatin (ZOCOR) 40 MG tablet Take 1 tablet (40 mg total) by mouth at bedtime. 90 tablet 3  . traMADol (ULTRAM) 50 MG tablet Take 1 tablet (50 mg total) by mouth as needed. 60 tablet 2  . traZODone (DESYREL) 100 MG tablet TAKE 1/2 TABLET  (50MG ) AT BEDTIME 45 tablet 0  . traZODone (DESYREL) 50 MG  tablet Take 50 mg by mouth at bedtime.     . triamcinolone cream (KENALOG) 0.1 % Apply 1 application topically 2 (two) times daily as needed (Eczema on legs).     No current facility-administered medications on file prior to visit.    BP 150/100 mmHg  Temp(Src) 98.6 F (37 C) (Oral)  Ht 5\' 3"  (1.6 m)  Wt 184 lb (83.462 kg)  BMI 32.60 kg/m2       Objective:   Physical Exam  Constitutional: She appears well-developed and well-nourished. No distress.  HENT:  Head: Normocephalic and atraumatic.  Right Ear: External ear normal.  Left Ear: External ear normal.  Nose: Nose normal.  Mouth/Throat: Oropharynx is clear and moist. No oropharyngeal exudate.  Eyes: Conjunctivae and EOM are normal. Pupils are equal, round, and reactive to light. Right eye exhibits no discharge. Left eye exhibits no discharge.  Neck: Normal range of motion. Neck supple. No thyromegaly present.  Cardiovascular: Normal rate, regular rhythm, normal heart sounds and intact distal pulses.  Exam reveals no gallop and no friction rub.   No murmur heard. Pulmonary/Chest: Effort normal and breath sounds normal. No respiratory distress. She has no wheezes. She has no rales. She exhibits no tenderness.  Musculoskeletal: Normal range of motion. She exhibits edema (trace bilateral lower extremity edema ) and tenderness.  Walks with wheeled walker. Is able to get from chair to exam table without difficulty. Needs assistance getting off exam table.   Lymphadenopathy:    She has no cervical adenopathy.  Neurological: She is alert.  Skin: Skin is warm and dry. No rash noted. She is not diaphoretic. No erythema. No pallor.  Psychiatric: She has a normal mood and affect. Her behavior is normal. Judgment and thought content normal.  Nursing note and vitals reviewed.     Assessment & Plan:  1. Preoperative clearance - Unable to obtain 12 lead EKG due to machines not functioning. She will have this done at Cardiology or during  pre-op labs - She will be cleared for surgery from our point of view 0- Follow up with Cardiology

## 2015-07-04 NOTE — Patient Instructions (Signed)
It was great meeting you today!  I will fax in your paper work to [employer]   I wish you the best of luck during this surgery and a speedy recovery.

## 2015-07-11 ENCOUNTER — Ambulatory Visit (INDEPENDENT_AMBULATORY_CARE_PROVIDER_SITE_OTHER): Payer: Commercial Managed Care - HMO | Admitting: Internal Medicine

## 2015-07-11 ENCOUNTER — Encounter: Payer: Self-pay | Admitting: Internal Medicine

## 2015-07-11 VITALS — BP 120/72 | HR 64 | Ht 63.0 in | Wt 184.0 lb

## 2015-07-11 DIAGNOSIS — K219 Gastro-esophageal reflux disease without esophagitis: Secondary | ICD-10-CM

## 2015-07-11 DIAGNOSIS — R197 Diarrhea, unspecified: Secondary | ICD-10-CM

## 2015-07-11 DIAGNOSIS — C182 Malignant neoplasm of ascending colon: Secondary | ICD-10-CM | POA: Diagnosis not present

## 2015-07-11 MED ORDER — NA SULFATE-K SULFATE-MG SULF 17.5-3.13-1.6 GM/177ML PO SOLN: 1.0000 | Freq: Once | ORAL | Status: DC

## 2015-07-11 NOTE — Patient Instructions (Signed)

## 2015-07-11 NOTE — Progress Notes (Signed)
HISTORY OF PRESENT ILLNESS:  [name] is a 81 y.o. female with multiple medical problems as listed below. She is sent today by the cancer center regarding the need for colonoscopy. Also increased frequency of bowel habits. Patient does have a history of GERD, hiatal hernia, and adenomatous colon polyps for which she undergone prior colonoscopy in 1999, 2004, and 2007. She was last evaluated in the office September 2014 for iron deficiency anemia. We discussed optimal GI workup including upper endoscopy and optical colonoscopy. See that dictation. After extensive discussion she decided to forego the examinations with other recommendations as listed. She is accompanied by her daughter today. In November 2015 patient presented with anemia, anorexia, and abdominal pain. She was found to have obstructing colon cancer at the level of the ileocecal valve and ascending colon for which she underwent resection. Concurrent with all that wasn't active colovesical fistula for which she underwent diverting colostomy. 6 months later with sigmoid colectomy for severe diverticular disease and reversal of colostomy. Since that time increased frequency of bowel movements with some urgency. Evaluated by oncology October 2016. At that time was said to have had problems with abdominal pain, but the patient denies this today. In any event, CT scan around that time was unremarkable and her CEA level normal. She was encouraged follow-up in this office regarding GI complaints and the need for colonoscopy. No colonoscopy since 2007. She is anticipating left knee replacement surgery in 3 weeks. Currently, the patient denies abdominal pain. No bleeding. No fevers. Aside from her knee, activity level has been reasonable. Outside records, CT, and laboratories reviewed. Last hemoglobin normal at 12.3 in October. Comprehensive metabolic panel and CBC otherwise unremarkable as well. CEA level I.8  REVIEW OF SYSTEMS:  All non-GI ROS  negative except for sinus allergy trouble, anxiety, arthritis, back pain, depression, fatigue, muscle cramps, night sweats, sleeping problems, ankle swelling, urinary leakage  Past Medical History  Diagnosis Date  . CAD (coronary artery disease)     BMS x2 to RCA 10/2007  . Essential hypertension   . Diastolic CHF, chronic ([place])   . History of TIA (transient ischemic attack)   . Hyperlipidemia   . Depression   . COPD with chronic bronchitis ([place])   . GERD (gastroesophageal reflux disease)   . History of hiatal hernia   . History of DVT of lower extremity   . Sigmoid diverticulosis   . Colovaginal fistula   . History of adenomatous polyp of colon   . Renal insufficiency   . OA (osteoarthritis)   . DJD (degenerative joint disease)   . Wears glasses   . Wears dentures   . Colostomy in place [facility])   . Peripheral neuropathy ([place])   . Adenocarcinoma, colon [facility]) Oncologist-- Dr Truitt Merle    Multifocal (2) Colon cancer @ ileocecal valve and ascending ( [id number]), Stage IIIB, grade I, MMR normal , 2 of 16 +lymph nodes, negative surgical margins---  06-02-2014  Right hemicolectomy w/ colostomy  . Chronic anemia   . History of blood transfusion     Past Surgical History  Procedure Laterality Date  . Appendectomy    . Cholecystectomy    . Abdominal hysterectomy    . Lumbar laminectomy  10-29-2002,  1969    Left  L3 -- 4  decompression  . Total knee arthroplasty Bilateral left 1994/  right 2000  . Rotator cuff repair Bilateral   . Cataract extraction w/ intraocular lens  implant, bilateral Bilateral   .  Tonsillectomy    . Dilation and curettage of uterus    . Patellectomy Right 09/14/2013    Procedure: PATELLECTOMY;  Surgeon: Meredith Pel, MD;  Location: [place];  Service: Orthopedics;  Laterality: Right;  . Partial colectomy N/A 06/02/2014    Procedure: RIGHT PARTIAL COLECTOMY ;  Surgeon: Donnie Mesa, MD;  Location: [place];  Service: General;  Laterality: N/A;  . Colostomy N/A  06/02/2014    Procedure: DIVERTING DESCENDING END COLOSTOMY;  Surgeon: Donnie Mesa, MD;  Location: [place];  Service: General;  Laterality: N/A;  . Revision total knee arthroplasty Bilateral right  04-02-2011/  left 1996 & 10-05-1999  . Anterior cervical decomp/discectomy fusion  01-31-2009    C5 -- 7  . Cardiovascular stress test  02-26-2011    Normal lexiscan no exercise study/  no ischemia/  normal LV function and wall motion, ef 67%  . Transthoracic echocardiogram  12-01-2009    Grade I diastolic dysfunction/  ef 60%/  moderate MR/  mild TR  . Evaluation under anesthesia with anal fistulectomy N/A 10/13/2014    Procedure: ANAL EXAM UNDER ANESTHESIA ;  Surgeon: [name], MD;  Location: [facility];  Service: General;  Laterality: N/A;  . Proctoscopy N/A 10/13/2014    Procedure: RIDGE PROCTOSCOPY;  Surgeon: [name], MD;  Location: [facility];  Service: General;  Laterality: N/A;  . Coronary angioplasty with stent placement  11-04-2007  dr [email or web address]    BMS x2 to RCA/  mild Non-obstructive disease LAD/  normal LVF  . Bone spurs Bilateral     Feet  . Hand surgery      Tendon repair  . Carpal tunnel release Right   . Laparoscopic sigmoid colectomy N/A 11/24/2014    Procedure: SIGMOID COLECTOMY AND COLSOTOMY CLOSURE;  Surgeon: Donnie Mesa, MD;  Location: [place];  Service: General;  Laterality: N/A;    Social History Dulse C Lamy  reports that she quit smoking about 48 years ago. Her smoking use included Cigarettes. She has a 7.5 pack-year smoking history. She has never used smokeless tobacco. She reports that she does not drink alcohol or use illicit drugs.  family history includes Cancer (age of onset: 60) in her brother; Cancer (age of onset: 30) in her sister; Cancer (age of onset: 9) in her maternal uncle; Heart failure in her mother; Osteoarthritis in her mother.  No Known Allergies     PHYSICAL EXAMINATION: Vital signs: BP 120/72 mmHg  Pulse 64  Ht '5\' 3"'$  (1.6 m)  Wt 184 lb  (83.462 kg)  BMI 32.60 kg/m2  Constitutional: Overweight elderly female who is generally well-appearing, no acute distress Psychiatric: alert and oriented x3, cooperative Eyes: extraocular movements intact, anicteric, conjunctiva pink Mouth: oral pharynx moist, no lesions Neck: supple no lymphadenopathy Cardiovascular: heart regular rate and rhythm, no murmur Lungs: clear to auscultation bilaterally Abdomen: soft, obese, nontender, nondistended, no obvious ascites, no peritoneal signs, normal bowel sounds, no organomegaly. Rectal: Deferred until colonoscopy Extremities: no clubbing cyanosis. Trace lower extremity edema bilaterally Skin: no lesions on visible extremities Neuro: No focal deficits. Cranial nerves intact.  ASSESSMENT:  #1. History of colon cancer'@ileocecal'$  valve and ascending colon status post right hemicolectomy, November 2015 #2. Concurrent symptomatic rectovaginal fistula with colostomy at the time of right hemicolectomy #3. Status post sigmoid colectomy with reversal of colostomy [place] 2016. No further rectovaginal symptoms. #4. Loose stools postoperatively. To be expected with altered anatomy #5. Multiple medical problems. Stable. Anticipating knee replacement surgery in 3 weeks #6.  GERD with hiatal hernia. On PPI   PLAN:  #1. Colonoscopy. The patient is high-risk for the procedure given her age and comorbidities.The nature of the procedure, as well as the risks, benefits, and alternatives were carefully and thoroughly reviewed with the patient. Ample time for discussion and questions allowed. The patient understood, was satisfied, and agreed to proceed. #2. Consider Questran or antidiarrheal agent for loose stools #3. Continue PPI

## 2015-07-12 ENCOUNTER — Other Ambulatory Visit: Payer: Self-pay | Admitting: Internal Medicine

## 2015-07-12 ENCOUNTER — Telehealth: Payer: Self-pay | Admitting: Hematology

## 2015-07-12 ENCOUNTER — Ambulatory Visit: Payer: Self-pay | Admitting: Orthopedic Surgery

## 2015-07-12 NOTE — Progress Notes (Signed)
Preoperative surgical orders have been place into the Epic hospital system for [name] on 07/12/2015, 3:06 PM  by Mickel Crow for surgery on 08-02-2015.  Preop Total Knee Revision orders including Experal, IV Tylenol, and IV Decadron as long as there are no contraindications to the above medications. Arlee Muslim, PA-C

## 2015-07-12 NOTE — Telephone Encounter (Signed)
Returned patient call re r/s 2/3 lab/fu. Gave patient new appointment for 2/17.

## 2015-07-14 ENCOUNTER — Encounter: Payer: Self-pay | Admitting: Cardiology

## 2015-07-14 ENCOUNTER — Ambulatory Visit (INDEPENDENT_AMBULATORY_CARE_PROVIDER_SITE_OTHER): Payer: Commercial Managed Care - HMO | Admitting: Cardiology

## 2015-07-14 VITALS — BP 122/72 | HR 87 | Ht 63.0 in | Wt 186.4 lb

## 2015-07-14 DIAGNOSIS — I5032 Chronic diastolic (congestive) heart failure: Secondary | ICD-10-CM

## 2015-07-14 DIAGNOSIS — Z01810 Encounter for preprocedural cardiovascular examination: Secondary | ICD-10-CM | POA: Diagnosis not present

## 2015-07-14 MED ORDER — ASPIRIN EC 325 MG PO TBEC: 325.0000 mg | DELAYED_RELEASE_TABLET | Freq: Every day | ORAL | Status: DC

## 2015-07-14 NOTE — Progress Notes (Signed)
07/14/2015 [name]   01-12-1930  [id number]  Primary Physician Nyoka Cowden, MD Primary Cardiologist: Dr. Angelena Form Electrophysiologist:   Reason for Visit/CC: Surgical Clearance  HPI:   81 year old woman with known CAD status post BMS x 2 to the RCA in 2009 and normal LV systolic function. Followed by Dr. Angelena Form. She was last seen by Dr. Domenic Polite for pre-operative clearance for an elective sigmoid colectomy and colostomy closure on [place] 12. Based on his assessment and her ability to perform at least 4 METS of physical activity w/o any anginal symptoms, he cleared her for surgery. She did well with this w/o any issues.  She presents back to clinic today for surgical clearance for left TKR scheduled for 08/02/15. She denies any chest pain. No dyspnea, syncope/ near syncope. She can continues to due activityes without exertional symptoms.   Her EKG today shows new onset atrial fibrillation with a CVR. She is asymptomatic. No h/o stroke/TIA.      Current Outpatient Prescriptions  Medication Sig Dispense Refill  . acetaminophen (TYLENOL) 325 MG tablet Take 650 mg by mouth every 6 (six) hours as needed.    . Ascorbic Acid (VITAMIN C) 500 MG CAPS Take 500 mg by mouth daily.     Marland Kitchen aspirin EC 325 MG tablet Take 1 tablet (325 mg total) by mouth daily. 30 tablet 0  . benazepril (LOTENSIN) 40 MG tablet Take 40 mg by mouth daily.    . cholecalciferol (VITAMIN D) 1000 UNITS tablet Take 1,000 Units by mouth daily.    Marland Kitchen esomeprazole (NEXIUM) 20 MG capsule Take 20 mg by mouth daily at 12 noon.    . ferrous sulfate 325 (65 FE) MG tablet Take 1 tablet (325 mg total) by mouth 2 (two) times daily with a meal.  3  . gabapentin (NEURONTIN) 600 MG tablet Take 1 tablet by mouth 3 (three) times daily.    Marland Kitchen HYDROcodone-acetaminophen (NORCO/VICODIN) 5-325 MG tablet Take 1 tablet by mouth every 6 (six) hours as needed for moderate pain. 90 tablet 0  . LORazepam (ATIVAN) 0.5 MG tablet Take 0.5 mg  by mouth 2 (two) times daily.    . metoprolol (LOPRESSOR) 50 MG tablet Take 1 tablet (50 mg total) by mouth 2 (two) times daily. 180 tablet 1  . nitroGLYCERIN (NITROSTAT) 0.4 MG SL tablet Place 0.4 mg under the tongue every 5 (five) minutes as needed for chest pain (x 3 pills).     Vladimir Faster Glycol-Propyl Glycol (SYSTANE OP) Place 1 drop into both eyes 2 (two) times daily as needed (for dry eyes).    . promethazine (PHENERGAN) 12.5 MG tablet Take 1 tablet by mouth every 6 (six) hours as needed for nausea or vomiting. Nausea/vomitting    . sertraline (ZOLOFT) 50 MG tablet Take 1 tablet by mouth daily.    . traMADol (ULTRAM) 50 MG tablet Take 50 mg by mouth every 6 (six) hours as needed for moderate pain.    . traZODone (DESYREL) 100 MG tablet Take 50 mg by mouth at bedtime. Take one half tablet (50 mg) as needed at bedtime for sleep    . triamcinolone cream (KENALOG) 0.1 % Apply 1 application topically 2 (two) times daily as needed (Eczema on legs).     No current facility-administered medications for this visit.    No Known Allergies  Social History   Social History  . Marital Status: Married    Spouse Name: N/A  . Number of Children: N/A  . Years  of Education: N/A   Occupational History  . Not on file.   Social History Main Topics  . Smoking status: Former Smoker -- 0.50 packs/day for 15 years    Types: Cigarettes    Quit date: 07/16/1967  . Smokeless tobacco: Never Used  . Alcohol Use: No  . Drug Use: No  . Sexual Activity: Not on file   Other Topics Concern  . Not on file   Social History Narrative   Married, 2 children.  As of November 2015 her husband has been living in a nursing home for tendon after years as he suffers from multiple medical problems.   Patient does not drink alcohol nor does she smoke or chew tobacco products   Right handed   8th grade   1 cup daily     Review of Systems: General: negative for chills, fever, night sweats or weight changes.    Cardiovascular: negative for chest pain, dyspnea on exertion, edema, orthopnea, palpitations, paroxysmal nocturnal dyspnea or shortness of breath Dermatological: negative for rash Respiratory: negative for cough or wheezing Urologic: negative for hematuria Abdominal: negative for nausea, vomiting, diarrhea, bright red blood per rectum, melena, or hematemesis Neurologic: negative for visual changes, syncope, or dizziness All other systems reviewed and are otherwise negative except as noted above.    Blood pressure 122/72, pulse 87, height 5\' 3"  (1.6 m), weight 186 lb 6.4 oz (84.55 kg).  General appearance: alert, cooperative and no distress Neck: no carotid bruit and no JVD Lungs: clear to auscultation bilaterally Heart: irregularly irregular rhythm and regular rate Extremities: no LEE Pulses: 2+ and symmetric Skin: warm and dry Neurologic: Grossly normal  EKG atrial fibrillation CVR.   ASSESSMENT AND PLAN:   1. Surgical Clearance: Patient denies any anginal symptoms. She can perform at least 4 METs of physical activity w/o exertional limitations. I've discussed new onset atrial fibrillation with Dr. Angelena Form, DOD. Since she is rate controlled, he feels that this should no delay her surgery. No pre-operative testing indicated. She has been cleared for surgery.   2. New Onset Atrial Fibrillation: newly diagnosed. Rate is well controlled. She is asymptomatic. Per Dr. Angelena Form, we will wait until after her surgery to start oral anticoagulation. For now, she has been instructed to take 325 mg of ASA daily. She can hold prior to surgery.     PLAN  Patient cleared for surgery. Plan for f/u after surgery to start oral anticoagulation for atrial fibrillation.   Lyda Jester PA-C 07/14/2015 4:19 PM

## 2015-07-14 NOTE — Patient Instructions (Signed)
Medication Instructions:  Your physician has recommended you make the following change in your medication:  1. Start Asprin ( 325 mg ) daily   Labwork: -None  Testing/Procedures: -None   Follow-Up: Your physician recommends that you keep your scheduled  follow-up appointment with Lyda Jester, PA-C   Any Other Special Instructions Will Be Listed Below (If Applicable).     If you need a refill on your cardiac medications before your next appointment, please call your pharmacy.

## 2015-07-18 ENCOUNTER — Ambulatory Visit (INDEPENDENT_AMBULATORY_CARE_PROVIDER_SITE_OTHER): Payer: Commercial Managed Care - HMO | Admitting: Internal Medicine

## 2015-07-18 ENCOUNTER — Encounter: Payer: Self-pay | Admitting: Internal Medicine

## 2015-07-18 VITALS — BP 134/90 | HR 93 | Temp 98.5°F | Ht 63.0 in | Wt 186.3 lb

## 2015-07-18 DIAGNOSIS — M159 Polyosteoarthritis, unspecified: Secondary | ICD-10-CM

## 2015-07-18 DIAGNOSIS — R238 Other skin changes: Secondary | ICD-10-CM | POA: Diagnosis not present

## 2015-07-18 DIAGNOSIS — L089 Local infection of the skin and subcutaneous tissue, unspecified: Secondary | ICD-10-CM

## 2015-07-18 DIAGNOSIS — J22 Unspecified acute lower respiratory infection: Secondary | ICD-10-CM

## 2015-07-18 DIAGNOSIS — M150 Primary generalized (osteo)arthritis: Secondary | ICD-10-CM

## 2015-07-18 DIAGNOSIS — J988 Other specified respiratory disorders: Secondary | ICD-10-CM

## 2015-07-18 DIAGNOSIS — T148XXA Other injury of unspecified body region, initial encounter: Secondary | ICD-10-CM

## 2015-07-18 DIAGNOSIS — M25562 Pain in left knee: Secondary | ICD-10-CM

## 2015-07-18 DIAGNOSIS — T798XXA Other early complications of trauma, initial encounter: Secondary | ICD-10-CM | POA: Diagnosis not present

## 2015-07-18 DIAGNOSIS — M8949 Other hypertrophic osteoarthropathy, multiple sites: Secondary | ICD-10-CM

## 2015-07-18 MED ORDER — CEPHALEXIN 500 MG PO CAPS: 500.0000 mg | ORAL_CAPSULE | Freq: Four times a day (QID) | ORAL | Status: DC

## 2015-07-18 NOTE — Patient Instructions (Signed)
Antibiotic for wound infection . Do not use peroxide directly in wound . It can damage tissues  Just used for  Getting rid of crusts . Cleanse with sterile saline  Antibiotic topical and non stick dressing  Keep appt with ortho office . Should be better before surgery . resp infection Logie be viral and antibiotic not effect course of illness however at risk of getting other infections.  Recheck if fever not going in another 2 days or  Significant worsening .  Plan ROV dr K in about a week or as needed   Wound Infection A wound infection happens when a type of germ (bacteria) starts growing in the wound. In some cases, this can cause the wound to break open. If cared for properly, the infected wound will heal from the inside to the outside. Wound infections need treatment. CAUSES An infection is caused by bacteria growing in the wound.  SYMPTOMS   Increase in redness, swelling, or pain at the wound site.  Increase in drainage at the wound site.  Wound or bandage (dressing) starts to smell bad.  Fever.  Feeling tired or fatigued.  Pus draining from the wound. TREATMENT  Your health care provider will prescribe antibiotic medicine. The wound infection should improve within 24 to 48 hours. Any redness around the wound should stop spreading and the wound should be less painful.  HOME CARE INSTRUCTIONS   Only take over-the-counter or prescription medicines for pain, discomfort, or fever as directed by your health care provider.  Take your antibiotics as directed. Finish them even if you start to feel better.  Gently wash the area with mild soap and water 2 times a day, or as directed. Rinse off the soap. Pat the area dry with a clean towel. Do not rub the wound. This [place] cause bleeding.  Follow your health care provider's instructions for how often you need to change the dressing.  Apply ointment and a dressing to the wound as directed.  If the dressing sticks, moisten it with soapy  water and gently remove it.  Change the bandage right away if it becomes wet, dirty, or develops a bad smell.  Take showers. Do not take tub baths, swim, or do anything that [place] soak the wound until it is healed.  Avoid exercises that make you sweat heavily.  Use anti-itch medicine as directed by your health care provider. The wound [place] itch when it is healing. Do not pick or scratch at the wound.  Follow up with your health care provider to get your wound rechecked as directed. SEEK MEDICAL CARE IF:  You have an increase in swelling, pain, or redness around the wound.  You have an increase in the amount of pus coming from the wound.  There is a bad smell coming from the wound.  More of the wound breaks open.  You have a fever. MAKE SURE YOU:   Understand these instructions.  Will watch your condition.  Will get help right away if you are not doing well or get worse.   This information is not intended to replace advice given to you by your health care provider. Make sure you discuss any questions you have with your health care provider.   Document Released: 03/30/2003 Document Revised: 07/06/2013 Document Reviewed: 12/19/2014 Elsevier Interactive Patient Education [employer].

## 2015-07-18 NOTE — Progress Notes (Signed)
Pre visit review using our clinic review tool, if applicable. No additional management support is needed unless otherwise documented below in the visit note.  Chief Complaint  Patient presents with  . Left Leg Wound    Hit her leg on car door two weeks ago.  Has upcoming colonoscopy on 07/25/15 and total knee replacement on left knee 08/02/15 (same leg).  . Cough  . Headache  . Sinus Pressure/Pain  . Fever  . Generalized Body Aches    HPI: Patient [name]  comes in today for SDA for  new problem evaluation. PCPNA here with family member   Hip left lower leg on car door about 2 weeks ago. Cleans it with peroxide and covers it with antibiotic ointment there is redness around it and it is tender it isn't getting any better. According to daughter might be slightly worse from 3 days ago but she didn't know about the injury until recently.  She is to have knee surgery last on the 19th. Has a preop this week  Cold with fever 99 100 at night . X 2   Cough   Took mucinex and  Alka septeer and air born.   And  . No sever pain sob hemioptysis   She is to have a colonoscopy surveillance because of her recent history of colon cancer and bowel surgery asks that this interferes.  Family member concerned that every time she gets a cough it goes down to her chest is there something she can do to prevent it. ROS: See pertinent positives and negatives per HPI.  Past Medical History  Diagnosis Date  . CAD (coronary artery disease)     BMS x2 to RCA 10/2007  . Essential hypertension   . Diastolic CHF, chronic ([place])   . History of TIA (transient ischemic attack)   . Hyperlipidemia   . Depression   . COPD with chronic bronchitis ([place])   . GERD (gastroesophageal reflux disease)   . History of hiatal hernia   . History of DVT of lower extremity   . Sigmoid diverticulosis   . Colovaginal fistula   . History of adenomatous polyp of colon   . Renal insufficiency   . OA (osteoarthritis)   . DJD  (degenerative joint disease)   . Wears glasses   . Wears dentures   . Colostomy in place [facility])   . Peripheral neuropathy ([place])   . Adenocarcinoma, colon [facility]) Oncologist-- Dr Truitt Merle    Multifocal (2) Colon cancer @ ileocecal valve and ascending ( [id number]), Stage IIIB, grade I, MMR normal , 2 of 16 +lymph nodes, negative surgical margins---  06-02-2014  Right hemicolectomy w/ colostomy  . Chronic anemia   . History of blood transfusion     Family History  Problem Relation Age of Onset  . Osteoarthritis Mother   . Heart failure Mother   . Cancer Sister 46    ? colon cancer   . Cancer Brother 30    Kidney cancer   . Cancer Maternal Uncle 10    Colon cancer     Social History   Social History  . Marital Status: Married    Spouse Name: N/A  . Number of Children: N/A  . Years of Education: N/A   Social History Main Topics  . Smoking status: Former Smoker -- 0.50 packs/day for 15 years    Types: Cigarettes    Quit date: 07/16/1967  . Smokeless tobacco: Never Used  . Alcohol Use: No  .  Drug Use: No  . Sexual Activity: Not Asked   Other Topics Concern  . None   Social History Narrative   Married, 2 children.  As of November 2015 her husband has been living in a nursing home for tendon after years as he suffers from multiple medical problems.   Patient does not drink alcohol nor does she smoke or chew tobacco products   Right handed   8th grade   1 cup daily    Outpatient Prescriptions Prior to Visit  Medication Sig Dispense Refill  . acetaminophen (TYLENOL) 325 MG tablet Take 650 mg by mouth every 6 (six) hours as needed.    . Ascorbic Acid (VITAMIN C) 500 MG CAPS Take 500 mg by mouth daily.     Marland Kitchen aspirin EC 325 MG tablet Take 1 tablet (325 mg total) by mouth daily. 30 tablet 0  . benazepril (LOTENSIN) 40 MG tablet Take 40 mg by mouth daily.    . cholecalciferol (VITAMIN D) 1000 UNITS tablet Take 1,000 Units by mouth daily.    Marland Kitchen esomeprazole (NEXIUM) 20 MG capsule  Take 20 mg by mouth daily at 12 noon.    . ferrous sulfate 325 (65 FE) MG tablet Take 1 tablet (325 mg total) by mouth 2 (two) times daily with a meal.  3  . gabapentin (NEURONTIN) 600 MG tablet Take 1 tablet by mouth 3 (three) times daily.    Marland Kitchen HYDROcodone-acetaminophen (NORCO/VICODIN) 5-325 MG tablet Take 1 tablet by mouth every 6 (six) hours as needed for moderate pain. 90 tablet 0  . LORazepam (ATIVAN) 0.5 MG tablet Take 0.5 mg by mouth 2 (two) times daily.    . metoprolol (LOPRESSOR) 50 MG tablet Take 1 tablet (50 mg total) by mouth 2 (two) times daily. 180 tablet 1  . nitroGLYCERIN (NITROSTAT) 0.4 MG SL tablet Place 0.4 mg under the tongue every 5 (five) minutes as needed for chest pain (x 3 pills).     Vladimir Faster Glycol-Propyl Glycol (SYSTANE OP) Place 1 drop into both eyes 2 (two) times daily as needed (for dry eyes).    . promethazine (PHENERGAN) 12.5 MG tablet Take 1 tablet by mouth every 6 (six) hours as needed for nausea or vomiting. Nausea/vomitting    . sertraline (ZOLOFT) 50 MG tablet Take 1 tablet by mouth daily.    . traMADol (ULTRAM) 50 MG tablet Take 50 mg by mouth every 6 (six) hours as needed for moderate pain.    . traZODone (DESYREL) 100 MG tablet Take 50 mg by mouth at bedtime. Take one half tablet (50 mg) as needed at bedtime for sleep    . triamcinolone cream (KENALOG) 0.1 % Apply 1 application topically 2 (two) times daily as needed (Eczema on legs). Reported on 07/18/2015     No facility-administered medications prior to visit.     EXAM:  BP 134/90 mmHg  Pulse 93  Temp(Src) 98.5 F (36.9 C) (Oral)  Ht '5\' 3"'$  (1.6 m)  Wt 186 lb 4.8 oz (84.505 kg)  BMI 33.01 kg/m2  SpO2 96%  Body mass index is 33.01 kg/(m^2).  GENERAL: vitals reviewed and listed above, alert, oriented, appears well hydrated and in no acute distress pleasant cognitively intact mild congestion and occasional cough normal respirations. HEENT: atraumatic, conjunctiva  clear, no obvious  abnormalities on inspection of external nose and ears nose mildly congested TMs no acute findings OP : no lesion edema or exudate  NECK: no obvious masses on inspection palpation  LUNGS: clear  to auscultation bilaterally, no wheezes, rales or rhonchi, few coarse sounds CV: HRRR, no clubbing cyanosis o nl cap refill  Left lower extremity with 2-1/2 cm avulsion wound with skin retraction surrounding erythema about 3 cm with some redness distally. Wound is somewhat dark and moist no discharge noted. No abscess or mass effect. MS: moves all extremities without noticeable focal  abnormality walks with walker. PSYCH: pleasant and cooperative, no obvious depression or anxiety  ASSESSMENT AND PLAN:  Discussed the following assessment and plan:  Post-traumatic skin infection, initial encounter ([place])  Acute respiratory infection  Wound of skin  Primary osteoarthritis involving multiple joints  Left knee pain Discuss protection and wound care avoid the peroxide [place] take a while to heal add antibiotic Keflex 500 4 times a day for 10 days and have someone check the leg in about a week. She has multiple visits to doctors is possible that the orthopedist look at her leg can decide on whether surgery should be delayed. In regard to the respiratory infection appears to be viral she is at risk for complications but I don't know of any specific treatment to prevent it. Don't think the antibiotic should interfere with her colonoscopy. If she has respiratory symptoms discuss with the colonoscopy team -Patient advised to return or notify health care team  if symptoms worsen ,persist or new concerns arise. Total visit 72mns > 50% spent counseling and coordinating care as indicated in above note and in instructions to patient .     Patient Instructions  Antibiotic for wound infection . Do not use peroxide directly in wound . It can damage tissues  Just used for  Getting rid of crusts . Cleanse with sterile  saline  Antibiotic topical and non stick dressing  Keep appt with ortho office . Should be better before surgery . resp infection Gr[place]ow be viral and antibiotic not effect course of illness however at risk of getting other infections.  Recheck if fever not going in another 2 days or  Significant worsening .  Plan ROV dr K in about a week or as needed   Wound Infection A wound infection happens when a type of germ (bacteria) starts growing in the wound. In some cases, this can cause the wound to break open. If cared for properly, the infected wound will heal from the inside to the outside. Wound infections need treatment. CAUSES An infection is caused by bacteria growing in the wound.  SYMPTOMS   Increase in redness, swelling, or pain at the wound site.  Increase in drainage at the wound site.  Wound or bandage (dressing) starts to smell bad.  Fever.  Feeling tired or fatigued.  Pus draining from the wound. TREATMENT  Your health care provider will prescribe antibiotic medicine. The wound infection should improve within 24 to 48 hours. Any redness around the wound should stop spreading and the wound should be less painful.  HOME CARE INSTRUCTIONS   Only take over-the-counter or prescription medicines for pain, discomfort, or fever as directed by your health care provider.  Take your antibiotics as directed. Finish them even if you start to feel better.  Gently wash the area with mild soap and water 2 times a day, or as directed. Rinse off the soap. Pat the area dry with a clean towel. Do not rub the wound. This Camilo cause bleeding.  Follow your health care provider's instructions for how often you need to change the dressing.  Apply ointment and a dressing to the  wound as directed.  If the dressing sticks, moisten it with soapy water and gently remove it.  Change the bandage right away if it becomes wet, dirty, or develops a bad smell.  Take showers. Do not take tub baths, swim,  or do anything that Neto soak the wound until it is healed.  Avoid exercises that make you sweat heavily.  Use anti-itch medicine as directed by your health care provider. The wound Weekes itch when it is healing. Do not pick or scratch at the wound.  Follow up with your health care provider to get your wound rechecked as directed. SEEK MEDICAL CARE IF:  You have an increase in swelling, pain, or redness around the wound.  You have an increase in the amount of pus coming from the wound.  There is a bad smell coming from the wound.  More of the wound breaks open.  You have a fever. MAKE SURE YOU:   Understand these instructions.  Will watch your condition.  Will get help right away if you are not doing well or get worse.   This information is not intended to replace advice given to you by your health care provider. Make sure you discuss any questions you have with your health care provider.   Document Released: 03/30/2003 Document Revised: 07/06/2013 Document Reviewed: 12/19/2014 Elsevier Interactive Patient Education 2016 [place] K. Panosh M.D.

## 2015-07-21 ENCOUNTER — Other Ambulatory Visit: Payer: Commercial Managed Care - HMO

## 2015-07-21 ENCOUNTER — Ambulatory Visit: Payer: Commercial Managed Care - HMO | Admitting: Hematology

## 2015-07-24 ENCOUNTER — Telehealth: Payer: Self-pay

## 2015-07-24 NOTE — Telephone Encounter (Signed)
Spoke with patient - changed appointment time to 10:30am with arrival time of 9:30am.   Reviewed instructions with the new times in place.  Patient acknowledged and understood.

## 2015-07-25 ENCOUNTER — Ambulatory Visit: Payer: Commercial Managed Care - HMO | Admitting: Internal Medicine

## 2015-07-25 ENCOUNTER — Encounter: Payer: Self-pay | Admitting: Internal Medicine

## 2015-07-25 ENCOUNTER — Ambulatory Visit (INDEPENDENT_AMBULATORY_CARE_PROVIDER_SITE_OTHER): Payer: Commercial Managed Care - HMO | Admitting: Physician Assistant

## 2015-07-25 ENCOUNTER — Encounter: Payer: Commercial Managed Care - HMO | Admitting: Internal Medicine

## 2015-07-25 ENCOUNTER — Encounter: Payer: Self-pay | Admitting: Physician Assistant

## 2015-07-25 VITALS — BP 140/68 | HR 108 | Ht 63.0 in | Wt 179.4 lb

## 2015-07-25 VITALS — BP 156/99 | HR 131 | Temp 98.3°F | Ht 63.0 in | Wt 184.0 lb

## 2015-07-25 DIAGNOSIS — E782 Mixed hyperlipidemia: Secondary | ICD-10-CM

## 2015-07-25 DIAGNOSIS — I5032 Chronic diastolic (congestive) heart failure: Secondary | ICD-10-CM | POA: Diagnosis not present

## 2015-07-25 DIAGNOSIS — I4891 Unspecified atrial fibrillation: Secondary | ICD-10-CM | POA: Diagnosis not present

## 2015-07-25 DIAGNOSIS — I10 Essential (primary) hypertension: Secondary | ICD-10-CM

## 2015-07-25 DIAGNOSIS — I2510 Atherosclerotic heart disease of native coronary artery without angina pectoris: Secondary | ICD-10-CM | POA: Diagnosis not present

## 2015-07-25 DIAGNOSIS — Z85038 Personal history of other malignant neoplasm of large intestine: Secondary | ICD-10-CM

## 2015-07-25 DIAGNOSIS — M1990 Unspecified osteoarthritis, unspecified site: Secondary | ICD-10-CM

## 2015-07-25 LAB — BASIC METABOLIC PANEL
BUN: 21 mg/dL (ref 7–25)
CO2: 23 mmol/L (ref 20–31)
Calcium: 9.8 mg/dL (ref 8.6–10.4)
Chloride: 104 mmol/L (ref 98–110)
Creat: 1.27 mg/dL — ABNORMAL HIGH (ref 0.60–0.88)
Glucose, Bld: 115 mg/dL — ABNORMAL HIGH (ref 65–99)
Potassium: 4.2 mmol/L (ref 3.5–5.3)
Sodium: 141 mmol/L (ref 135–146)

## 2015-07-25 MED ORDER — METOPROLOL TARTRATE 50 MG PO TABS: 75.0000 mg | ORAL_TABLET | Freq: Two times a day (BID) | ORAL | Status: DC

## 2015-07-25 MED ORDER — SODIUM CHLORIDE 0.9 % IV SOLN: 500.0000 mL | INTRAVENOUS | Status: DC

## 2015-07-25 NOTE — Progress Notes (Signed)
Pt complaint of "I feel so weak".  No chest pain or sob noted.  Pt discharged and has appointment today at [facility] cardiology at 1:30 to See Richardson Dopp, PA. Corky Sing

## 2015-07-25 NOTE — Progress Notes (Signed)
Pt's blood pressure 156/99 and heart rate irregular 119-131.  Reported to [facility] Monday, CRNA.  He said to get pt ready in admitting and he would come in to see her.  After Merrily Pew, CRNA saw pt and retook b/p still evelvate and hr still above 120 he spoke with Dr. Henrene Pastor.  Dr. Henrene Pastor in to see pt and he cancalled colonoscopy for today.  Asked that I call pt's cardiologist and arrange for an appointment today.  I called [employer] technical sales and she was given an appointment for today at 1:30 with Richardson Dopp, PA.  Info given to pt, her daughter and grand daughter.  Per Dr. Henrene Pastor do not reschedule colonoscopy today.  Pt can called and reschedule colonoscopy once her atrial fib is in control. maw

## 2015-07-25 NOTE — Progress Notes (Signed)
Cardiology Office Note    Date:  07/25/2015   ID:  Gerhard Munch Farooq, DOB January 07, 1930, MRN [id number]  PCP:  Nyoka Cowden, MD  Cardiologist:  Dr. Lauree Chandler   Electrophysiologist:  n/a  Chief Complaint  Patient presents with  . Atrial Fibrillation    History of Present Illness:  [name] is a 82 y.o. female with a hx of CAD s/p BMS to RCA x 2 in 4650, diastolic HF, HTN, HL, COPD, pulmonary HT, ? prior TIA, prior DVT, colon CA.  colon cancer diagnosed in 11/15. She underwent right hemicolectomy with diverting colostomy. She saw Dr. Domenic Polite in 5/16 and was cleared from a cardiac perspective for colostomy takedown and sigmoid colectomy for diverticulosis.  She was recently seen by Lyda Jester, PA-C 07/14/15 for surgical clearance.  She is scheduled for L TKR 08/02/15 with Dr. Wynelle Link.  ECG was noted to demonstrated new onset AFib with controlled VR.  She was felt to be at acceptable risk for surgery.  It was felt that anticoagulation could be addressed after surgery.  CHADS2-VASc=7 (age, gender, vascular disease, HTN, ?TIA - head CT in past with prior lacunar infarcts).    She was schedule for colonoscopy today with Dr. Henrene Pastor due to recent GI complaints and prior hx of colon CA.  Her HR was noted to be rapid in AFib and she was added on to my schedule for evaluation.    Patient notes that she does feel her heart rate increased at times. She denies chest pain. She does note dyspnea with exertion. This is fairly chronic. She sleeps in a chair. She has done this for the last 6 months secondary to leg and back pain as well as dyspnea. She denies PND. She denies significant edema. She denies syncope. Denies significant cough or wheezing. She did have a recent URI which is resolving. Of note, she is on antibiotics for L leg wound.   Past Medical History  Diagnosis Date  . CAD (coronary artery disease)     a. LHC 4/09: pLAD 40, mDx 70-75, pLCx 30, mLCx 50, mRCA 90, EF 60%  >> PCI: BMS x2 to RCA;  b. Myoview 8/12: normal  . Essential hypertension   . Chronic diastolic CHF (congestive heart failure) (HCC)     a. Echo 912 - Mild LVH, EF 55-60%, no RWMA, Gr 1 DD, mild MR, mild LAE, mild RAE, PASP 59 mmHg (mod to severe pulmo HTN)  . History of TIA (transient ischemic attack)     a. Head CT 6/15: small chronic lacunar infarct in thalamus  . Hyperlipidemia   . Depression   . COPD with chronic bronchitis ([place])   . GERD (gastroesophageal reflux disease)   . History of hiatal hernia   . History of DVT of lower extremity   . Sigmoid diverticulosis     s/p sigmoid colectomy  . Colovaginal fistula     s/p colostomy >> colostomy takedown 5/16  . History of adenomatous polyp of colon   . Renal insufficiency   . OA (osteoarthritis)   . Wears glasses   . Wears dentures   . Colostomy in place [facility])     s/p colostomy takedown 5/16  . Peripheral neuropathy ([place])   . Adenocarcinoma, colon [facility]) Oncologist-- Dr Truitt Merle    Multifocal (2) Colon cancer @ ileocecal valve and ascending ( [id number]), Stage IIIB, grade I, MMR normal , 2 of 16 +lymph nodes, negative surgical margins---  06-02-2014  Right hemicolectomy w/  colostomy  . Chronic anemia   . History of blood transfusion   . Allergy   . Anxiety   . Cataract     bil cateracts removed  . Pulmonary HTN ([place])     a. PASP on Echo in 9/12:  59 mmHg    Past Surgical History  Procedure Laterality Date  . Appendectomy    . Cholecystectomy    . Abdominal hysterectomy    . Lumbar laminectomy  10-29-2002,  1969    Left  L3 -- 4  decompression  . Total knee arthroplasty Bilateral left 1994/  right 2000  . Rotator cuff repair Bilateral   . Cataract extraction w/ intraocular lens  implant, bilateral Bilateral   . Tonsillectomy    . Dilation and curettage of uterus    . Patellectomy Right 09/14/2013    Procedure: PATELLECTOMY;  Surgeon: Meredith Pel, MD;  Location: [place];  Service: Orthopedics;  Laterality: Right;  .  Partial colectomy N/A 06/02/2014    Procedure: RIGHT PARTIAL COLECTOMY ;  Surgeon: Donnie Mesa, MD;  Location: [place];  Service: General;  Laterality: N/A;  . Colostomy N/A 06/02/2014    Procedure: DIVERTING DESCENDING END COLOSTOMY;  Surgeon: Donnie Mesa, MD;  Location: [place];  Service: General;  Laterality: N/A;  . Revision total knee arthroplasty Bilateral right  04-02-2011/  left 1996 & 10-05-1999  . Anterior cervical decomp/discectomy fusion  01-31-2009    C5 -- 7  . Cardiovascular stress test  02-26-2011    Normal lexiscan no exercise study/  no ischemia/  normal LV function and wall motion, ef 67%  . Transthoracic echocardiogram  12-01-2009    Grade I diastolic dysfunction/  ef 60%/  moderate MR/  mild TR  . Evaluation under anesthesia with anal fistulectomy N/A 10/13/2014    Procedure: ANAL EXAM UNDER ANESTHESIA ;  Surgeon: [name], MD;  Location: [facility];  Service: General;  Laterality: N/A;  . Proctoscopy N/A 10/13/2014    Procedure: RIDGE PROCTOSCOPY;  Surgeon: [name], MD;  Location: [facility];  Service: General;  Laterality: N/A;  . Coronary angioplasty with stent placement  11-04-2007  dr [email or web address]    BMS x2 to RCA/  mild Non-obstructive disease LAD/  normal LVF  . Bone spurs Bilateral     Feet  . Hand surgery      Tendon repair  . Carpal tunnel release Right   . Laparoscopic sigmoid colectomy N/A 11/24/2014    Procedure: SIGMOID COLECTOMY AND COLSOTOMY CLOSURE;  Surgeon: Donnie Mesa, MD;  Location: [place];  Service: General;  Laterality: N/A;    Current Outpatient Prescriptions  Medication Sig Dispense Refill  . acetaminophen (TYLENOL) 325 MG tablet Take 650 mg by mouth every 6 (six) hours as needed for moderate pain.     . Ascorbic Acid (VITAMIN C) 500 MG CAPS Take 500 mg by mouth daily.     Marland Kitchen aspirin EC 325 MG tablet Take 1 tablet (325 mg total) by mouth daily. 30 tablet 0  . benazepril (LOTENSIN) 40 MG tablet Take 40 mg by mouth daily.    . cephALEXin  (KEFLEX) 500 MG capsule Take 1 capsule (500 mg total) by mouth 4 (four) times daily. 40 capsule 0  . cholecalciferol (VITAMIN D) 1000 UNITS tablet Take 1,000 Units by mouth daily.    Marland Kitchen esomeprazole (NEXIUM) 20 MG capsule Take 20 mg by mouth daily at 12 noon.    . ferrous sulfate 325 (65 FE) MG tablet Take 1  tablet (325 mg total) by mouth 2 (two) times daily with a meal.  3  . gabapentin (NEURONTIN) 600 MG tablet Take 1 tablet by mouth 3 (three) times daily.    Marland Kitchen HYDROcodone-acetaminophen (NORCO/VICODIN) 5-325 MG tablet Take 1 tablet by mouth every 6 (six) hours as needed for moderate pain. 90 tablet 0  . LORazepam (ATIVAN) 0.5 MG tablet Take 0.5 mg by mouth 2 (two) times daily.    . metoprolol (LOPRESSOR) 50 MG tablet Take 1.5 tablets (75 mg total) by mouth 2 (two) times daily. 270 tablet 3  . nitroGLYCERIN (NITROSTAT) 0.4 MG SL tablet Place 0.4 mg under the tongue every 5 (five) minutes as needed for chest pain (x 3 pills). Reported on 07/25/2015    . Polyethyl Glycol-Propyl Glycol (SYSTANE OP) Place 1 drop into both eyes 2 (two) times daily as needed (for dry eyes).    . promethazine (PHENERGAN) 12.5 MG tablet Take 1 tablet by mouth every 6 (six) hours as needed for nausea or vomiting. Nausea/vomitting    . sertraline (ZOLOFT) 50 MG tablet Take 1 tablet by mouth daily. Reported on 07/25/2015    . traMADol (ULTRAM) 50 MG tablet Take 50 mg by mouth every 6 (six) hours as needed for moderate pain.    . traZODone (DESYREL) 100 MG tablet Take 50 mg by mouth at bedtime. Take one half tablet (50 mg) as needed at bedtime for sleep    . triamcinolone cream (KENALOG) 0.1 % Apply 1 application topically 2 (two) times daily as needed (Eczema on legs). Reported on 07/25/2015     No current facility-administered medications for this visit.   Facility-Administered Medications Ordered in Other Visits  Medication Dose Route Frequency Provider Last Rate Last Dose  . 0.9 %  sodium chloride infusion  500 mL  Intravenous Continuous [name], MD        Allergies:   Review of patient's allergies indicates no known allergies.   Social History   Social History  . Marital Status: Married    Spouse Name: N/A  . Number of Children: N/A  . Years of Education: N/A   Social History Main Topics  . Smoking status: Former Smoker -- 0.50 packs/day for 15 years    Types: Cigarettes    Quit date: 07/16/1967  . Smokeless tobacco: Never Used  . Alcohol Use: No  . Drug Use: No  . Sexual Activity: Not Asked   Other Topics Concern  . None   Social History Narrative   Married, 2 children.  As of November 2015 her husband has been living in a nursing home for tendon after years as he suffers from multiple medical problems.   Patient does not drink alcohol nor does she smoke or chew tobacco products   Right handed   8th grade   1 cup daily     Family History:  The patient's family history includes Cancer (age of onset: 36) in her brother; Cancer (age of onset: 31) in her sister; Cancer (age of onset: 69) in her maternal uncle; Colon cancer in her maternal uncle; Heart failure in her mother; Osteoarthritis in her mother. There is no history of Esophageal cancer, Rectal cancer, or Stomach cancer.   ROS:   Please see the history of present illness.    Review of Systems  Constitution: Positive for malaise/fatigue.  Cardiovascular: Positive for dyspnea on exertion.  Respiratory: Positive for cough.   Hematologic/Lymphatic: Bruises/bleeds easily.  Musculoskeletal: Positive for back pain, joint swelling and  myalgias.  Gastrointestinal: Positive for nausea and vomiting.  Neurological: Positive for loss of balance.  Psychiatric/Behavioral: Positive for depression. The patient is nervous/anxious.   All other systems reviewed and are negative.   PHYSICAL EXAM:   VS:  BP 140/68 mmHg  Pulse 108  Ht '5\' 3"'$  (1.6 m)  Wt 179 lb 6.4 oz (81.375 kg)  BMI 31.79 kg/m2   GEN: Well nourished, well developed, in  no acute distress HEENT: normal Neck: no JVD, no masses Cardiac: Normal S1/S2, irregularly irregular rhythm; no murmurs Respiratory:  clear to auscultation bilaterally; no wheezing, rhonchi or rales GI: soft, nontender MS: no deformity or atrophy Skin: warm and dry, no rash Neuro:  no focal deficits  Psych: Alert and oriented x 3, normal affect   Wt Readings from Last 3 Encounters:  07/25/15 179 lb 6.4 oz (81.375 kg)  07/25/15 184 lb (83.462 kg)  07/18/15 186 lb 4.8 oz (84.505 kg)      Studies/Labs Reviewed:   EKG:  EKG is ordered today.  The ekg ordered today demonstrates coarse atrial fibrillation versus atypical atrial flutter, HR 115, nonspecific ST-T wave changes  Recent Labs: 04/26/2015: ALT 18; BUN 36.6*; Creatinine 1.4*; HGB 12.3; Platelets 156; Potassium 4.7; Sodium 139   Recent Lipid Panel    Component Value Date/Time   CHOL 130 01/19/2013 1014   TRIG 87.0 01/19/2013 1014   HDL 33.60* 01/19/2013 1014   CHOLHDL 4 01/19/2013 1014   VLDL 17.4 01/19/2013 1014   LDLCALC 79 01/19/2013 1014    Additional studies/ records that were reviewed today include:   Echo 9/12 Mild LVH, EF 55-60%, no RWMA, Gr 1 DD, mild MR, mild LAE, mild RAE, PASP 59 mmHg (mod to severe pulmo HTN)  Myoview 8/12 Normal stress nuclear study  LHC 4/09 LM ok LAD calcified, prox and mid 40%, Dx mid 70-75% LCx prox 30%, mid AV groove 50% RCA mid 90% EF 60% PCI: Vision BMS x 2 to mRCA   ASSESSMENT:    1. Atrial fibrillation with RVR ([place])   2. Coronary artery disease involving native coronary artery of native heart without angina pectoris   3. Chronic diastolic heart failure ([place])   4. Essential hypertension   5. History of colon cancer   6. Osteoarthritis, unspecified osteoarthritis type, unspecified site     PLAN:  In order of problems listed above:  1. AF with RVR - Heart rate is uncontrolled. She is somewhat symptomatic. She notes fatigue. Question if her fatigue is secondary  to atrial fibrillation. She does note some shortness of breath. She does not appear to be volume overloaded on exam. She takes Lasix as needed. Of note, her knee surgery scheduled for next Wednesday.  -  Increase metoprolol tartrate to 75 mg twice a day  -  Obtain BMET, BNP  -  Obtain echocardiogram to rule out tachycardia induced cardiomyopathy  -  She has significant stroke risk. Start anticoagulation after knee surgery or sooner if surgery has to be postponed.  2. CAD - No chest pain. She does have some left arm symptoms but this seems to be related to chronic frozen shoulder. She remains on aspirin, beta blocker.  3. Diastolic HF - She does not appear to be volume overloaded on exam. She does note increasing dyspnea. This is likely related to atrial fibrillation and uncontrolled heart rate. Obtain BMET, BNP is noted. Increased diuresis of BNP significantly elevated. Obtain follow-up echocardiogram as noted.  4. HTN -  BP somewhat elevated.  Some of her medications were held the day for her colonoscopy. Increase metoprolol tartrate as noted.  5. Colon CA - Reschedule screening colonoscopy once heart rate better controlled.  6. DJD - She has knee TKR upcoming next week.  If EF is down or HR remains difficult to control, we [facility] have to postpone her surgery.    Medication Adjustments/Labs and Tests Ordered: Current medicines are reviewed at length with the patient today.  Concerns regarding medicines are outlined above.  Medication changes, Labs and Tests ordered today are outlined in the Patient Instructions noted below.  Signed, Richardson Dopp, PA-C  07/25/2015 4:02 PM    [facility] [address], [place], [place]  [address] Phone: [phone]; Fax: [phone]    Patient Instructions  Medication Instructions:  1. INCREASE METOPROLOL TARTRATE TO 75 MG TWICE DAILY; NEW RX SENT IN   Labwork: 1. TODAY BMET, BNP  Testing/Procedures: Your physician has  requested that you have an echocardiogram PER [name], PAC THIS NEEDS TO BE DONE BETWEEN NOW AND Thursday 1/12; SURGERY CLEARANCE.Marland Kitchen Echocardiography is a painless test that uses sound waves to create images of your heart. It provides your doctor with information about the size and shape of your heart and how well your heart's chambers and valves are working. This procedure takes approximately one hour. There are no restrictions for this procedure.   Follow-Up: [place], [facility] 07/27/15 IF ECHO CAN BE DONE THAT SAME DAY   Any Other Special Instructions Will Be Listed Below (If Applicable).  If you need a refill on your cardiac medications before your next appointment, please call your pharmacy.

## 2015-07-25 NOTE — Patient Instructions (Signed)
Medication Instructions:  1. INCREASE METOPROLOL TARTRATE TO 75 MG TWICE DAILY; NEW RX SENT IN   Labwork: 1. TODAY BMET, BNP  Testing/Procedures: Your physician has requested that you have an echocardiogram PER SCOTT WEAVER, PAC THIS NEEDS TO BE DONE BETWEEN NOW AND Thursday 1/12; SURGERY CLEARANCE.Marland Kitchen Echocardiography is a painless test that uses sound waves to create images of your heart. It provides your doctor with information about the size and shape of your heart and how well your heart's chambers and valves are working. This procedure takes approximately one hour. There are no restrictions for this procedure.   Follow-Up: [place], [facility] 07/27/15 IF ECHO CAN BE DONE THAT SAME DAY   Any Other Special Instructions Will Be Listed Below (If Applicable).  If you need a refill on your cardiac medications before your next appointment, please call your pharmacy.

## 2015-07-26 ENCOUNTER — Other Ambulatory Visit (HOSPITAL_COMMUNITY): Payer: Self-pay | Admitting: *Deleted

## 2015-07-26 ENCOUNTER — Telehealth: Payer: Self-pay | Admitting: *Deleted

## 2015-07-26 DIAGNOSIS — I5032 Chronic diastolic (congestive) heart failure: Secondary | ICD-10-CM

## 2015-07-26 LAB — BRAIN NATRIURETIC PEPTIDE: Brain Natriuretic Peptide: 443.2 pg/mL — ABNORMAL HIGH (ref 0.0–100.0)

## 2015-07-26 NOTE — Patient Instructions (Addendum)
[name]  07/26/2015   Your procedure is scheduled on: Wednesday 08-02-15  Report to [facility] Main  Entrance take [facility]  elevators to 3rd floor to  [place] at 1:00 pm  Call this number if you have problems the morning of surgery [phone]   Remember: ONLY 1 PERSON Dipinto GO WITH YOU TO SHORT STAY TO GET  READY MORNING OF [name].  Do not eat food  :After Midnight, [name] have clear liquids from midnight until 900 am day of surgery, nothing by mouth after 900 am day of surgery.     Take these medicines the morning of surgery with A SIP OF WATER: nexium, gabapentin (neurontin), lorazepam (ativan), eye drop, metoprolol (lopresssor), sertraline (zoloft), hydrocodone if needed  DO NOT TAKE ANY DIABETIC MEDICATIONS DAY OF YOUR SURGERY                               You [place] not have any metal on your body including hair pins and              piercings  Do not wear jewelry, make-up, lotions, powders or perfumes, deodorant             Do not wear nail polish.  Do not shave  48 hours prior to surgery.              Men Seivert shave face and neck.   Do not bring valuables to the hospital. [place].  Contacts, dentures or bridgework Med[place] not be worn into surgery.  Leave suitcase in the car. After surgery it Martinezlopez be brought to your room.    Special Instructions: N/A              Please read over the following fact sheets you were given: _____________________________________________________________________                CLEAR LIQUID DIET   Foods Allowed                                                                     Foods Excluded  Coffee and tea, regular and decaf                             liquids that you cannot  Plain Jell-O in any flavor                                             see through such as: Fruit ices (not with fruit pulp)                                     milk, soups, orange  juice  Iced Popsicles  All solid food Carbonated beverages, regular and diet                                    Cranberry, grape and apple juices Sports drinks like Gatorade Lightly seasoned clear broth or consume(fat free) Sugar, honey syrup  Sample Menu Breakfast                                Lunch                                     Supper Cranberry juice                    Beef broth                            Chicken broth Jell-O                                     Grape juice                           Apple juice Coffee or tea                        Jell-O                                      Popsicle                                                Coffee or tea                        Coffee or tea  _____________________________________________________________________  [name] - Preparing for Surgery Before surgery, you can play an important role.  Because skin is not sterile, your skin needs to be as free of germs as possible.  You can reduce the number of germs on your skin by washing with CHG (chlorahexidine gluconate) soap before surgery.  CHG is an antiseptic cleaner which kills germs and bonds with the skin to continue killing germs even after washing. Please DO NOT use if you have an allergy to CHG or antibacterial soaps.  If your skin becomes reddened/irritated stop using the CHG and inform your nurse when you arrive at Short Stay. Do not shave (including legs and underarms) for at least 48 hours prior to the first CHG shower.  You [name] shave your face/neck. Please follow these instructions carefully:  1.  Shower with CHG Soap the night before surgery and the  morning of Surgery.  2.  If you choose to wash your hair, wash your hair first as usual with your  normal  shampoo.  3.  After you shampoo, rinse your hair and body thoroughly to remove the  shampoo.  4.  Use CHG as you would any other liquid soap.  You can  apply chg directly  to the skin and wash                       Gently with a scrungie or clean washcloth.  5.  Apply the CHG Soap to your body ONLY FROM THE NECK DOWN.   Do not use on face/ open                           Wound or open sores. Avoid contact with eyes, ears mouth and genitals (private parts).                       Wash face,  Genitals (private parts) with your normal soap.             6.  Wash thoroughly, paying special attention to the area where your surgery  will be performed.  7.  Thoroughly rinse your body with warm water from the neck down.  8.  DO NOT shower/wash with your normal soap after using and rinsing off  the CHG Soap.                9.  Pat yourself dry with a clean towel.            10.  Wear clean pajamas.            11.  Place clean sheets on your bed the night of your first shower and do not  sleep with pets. Day of Surgery : Do not apply any lotions/deodorants the morning of surgery.  Please wear clean clothes to the hospital/surgery center.  FAILURE TO FOLLOW THESE INSTRUCTIONS [place] RESULT IN THE CANCELLATION OF YOUR SURGERY PATIENT SIGNATURE_________________________________  NURSE SIGNATURE__________________________________  ________________________________________________________________________   [name]  An incentive spirometer is a tool that can help keep your lungs clear and active. This tool measures how well you are filling your lungs with each breath. Taking long deep breaths Dapper help reverse or decrease the chance of developing breathing (pulmonary) problems (especially infection) following:  A long period of time when you are unable to move or be active. BEFORE THE PROCEDURE   If the spirometer includes an indicator to show your best effort, your nurse or respiratory therapist will set it to a desired goal.  If possible, sit up straight or lean slightly forward. Try not to slouch.  Hold the incentive spirometer in an upright  position. INSTRUCTIONS FOR USE   Sit on the edge of your bed if possible, or sit up as far as you can in bed or on a chair.  Hold the incentive spirometer in an upright position.  Breathe out normally.  Place the mouthpiece in your mouth and seal your lips tightly around it.  Breathe in slowly and as deeply as possible, raising the piston or the ball toward the top of the column.  Hold your breath for 3-5 seconds or for as long as possible. Allow the piston or ball to fall to the bottom of the column.  Remove the mouthpiece from your mouth and breathe out normally.  Rest for a few seconds and repeat Steps 1 through 7 at least 10 times every 1-2 hours when you are awake. Take your time and take a few normal breaths between deep breaths.  The spirometer Jiggetts include an indicator to  show your best effort. Use the indicator as a goal to work toward during each repetition.  After each set of 10 deep breaths, practice coughing to be sure your lungs are clear. If you have an incision (the cut made at the time of surgery), support your incision when coughing by placing a pillow or rolled up towels firmly against it. Once you are able to get out of bed, walk around indoors and cough well. You [place] stop using the incentive spirometer when instructed by your caregiver.  RISKS AND COMPLICATIONS  Take your time so you do not get dizzy or light-headed.  If you are in pain, you [place]ja need to take or ask for pain medication before doing incentive spirometry. It is harder to take a deep breath if you are having pain. AFTER USE  Rest and breathe slowly and easily.  It can be helpful to keep track of a log of your progress. Your caregiver can provide you with a simple table to help with this. If you are using the spirometer at home, follow these instructions: [place] IF:   You are having difficultly using the spirometer.  You have trouble using the spirometer as often as instructed.  Your  pain medication is not giving enough relief while using the spirometer.  You develop fever of 100.5 F (38.1 C) or higher. SEEK IMMEDIATE MEDICAL CARE IF:   You cough up bloody sputum that had not been present before.  You develop fever of 102 F (38.9 C) or greater.  You develop worsening pain at or near the incision site. MAKE SURE YOU:   Understand these instructions.  Will watch your condition.  Will get help right away if you are not doing well or get worse. Document Released: 11/11/2006 Document Revised: 09/23/2011 Document Reviewed: 01/12/2007 Exit[employer] Patient Information 2014 Exit[employer], [place].   ________________________________________________________________________  WHAT IS A BLOOD TRANSFUSION? Blood Transfusion Information  A transfusion is the replacement of blood or some of its parts. Blood is made up of multiple cells which provide different functions.  Red blood cells carry oxygen and are used for blood loss replacement.  White blood cells fight against infection.  Platelets control bleeding.  Plasma helps clot blood.  Other blood products are available for specialized needs, such as hemophilia or other clotting disorders. BEFORE THE TRANSFUSION  Who gives blood for transfusions?   Healthy volunteers who are fully evaluated to make sure their blood is safe. This is blood bank blood. Transfusion therapy is the safest it has ever been in the practice of medicine. Before blood is taken from a donor, a complete history is taken to make sure that person has no history of diseases nor engages in risky social behavior (examples are intravenous drug use or sexual activity with multiple partners). The donor's travel history is screened to minimize risk of transmitting infections, such as malaria. The donated blood is tested for signs of infectious diseases, such as HIV and hepatitis. The blood is then tested to be sure it is compatible with you in order to minimize the  chance of a transfusion reaction. If you or a relative donates blood, this is often done in anticipation of surgery and is not appropriate for emergency situations. It takes many days to process the donated blood. RISKS AND COMPLICATIONS Although transfusion therapy is very safe and saves many lives, the main dangers of transfusion include:   Getting an infectious disease.  Developing a transfusion reaction. This is an allergic reaction  to something in the blood you were given. Every precaution is taken to prevent this. The decision to have a blood transfusion has been considered carefully by your caregiver before blood is given. Blood is not given unless the benefits outweigh the risks. AFTER THE TRANSFUSION  Right after receiving a blood transfusion, you will usually feel much better and more energetic. This is especially true if your red blood cells have gotten low (anemic). The transfusion raises the level of the red blood cells which carry oxygen, and this usually causes an energy increase.  The nurse administering the transfusion will monitor you carefully for complications. HOME CARE INSTRUCTIONS  No special instructions are needed after a transfusion. You Seelig find your energy is better. Speak with your caregiver about any limitations on activity for underlying diseases you [place] have. SEEK MEDICAL CARE IF:   Your condition is not improving after your transfusion.  You develop redness or irritation at the intravenous (IV) site. SEEK IMMEDIATE MEDICAL CARE IF:  Any of the following symptoms occur over the next 12 hours:  Shaking chills.  You have a temperature by mouth above 102 F (38.9 C), not controlled by medicine.  Chest, back, or muscle pain.  People around you feel you are not acting correctly or are confused.  Shortness of breath or difficulty breathing.  Dizziness and fainting.  You get a rash or develop hives.  You have a decrease in urine output.  Your urine  turns a dark color or changes to pink, red, or brown. Any of the following symptoms occur over the next 10 days:  You have a temperature by mouth above 102 F (38.9 C), not controlled by medicine.  Shortness of breath.  Weakness after normal activity.  The white part of the eye turns yellow (jaundice).  You have a decrease in the amount of urine or are urinating less often.  Your urine turns a dark color or changes to pink, red, or brown. Document Released: 06/28/2000 Document Revised: 09/23/2011 Document Reviewed: 02/15/2008 [facility] Patient Information 2014 [place], [place].  _______________________________________________________________________

## 2015-07-26 NOTE — Telephone Encounter (Signed)
Lmptcb on both home and cell for DPR [name]; pt's daughter, tcb for lab results and med changes.

## 2015-07-26 NOTE — Progress Notes (Signed)
Cardiology Office Note    Date:  07/27/2015   ID:  Gerhard Munch Colina, DOB February 09, 1930, MRN [id number]  PCP:  Nyoka Cowden, MD  Cardiologist:  Dr. Lauree Chandler   Electrophysiologist:  n/a  Chief Complaint  Patient presents with  . Follow-up  . Atrial Fibrillation    History of Present Illness:  Shelda Truby Guidroz is a 82 y.o. female with a hx of CAD s/p BMS to RCA x 2 in 0240, diastolic HF, HTN, HL, COPD, pulmonary HTN, ? prior TIA, prior DVT, colon CA.  Colon cancer diagnosed in 11/15. She underwent right hemicolectomy with diverting colostomy. She saw Dr. Domenic Polite in 5/16 and was cleared from a cardiac perspective for colostomy takedown and sigmoid colectomy for diverticulosis.  She was recently seen by Lyda Jester, PA-C 07/14/15 for surgical clearance.  She is scheduled for L TKR 08/02/15 with Dr. Wynelle Link.  ECG was noted to demonstrate new onset AFib with controlled VR.  She was felt to be at acceptable risk for surgery.  It was felt that anticoagulation could be addressed after surgery.  CHADS2-VASc=7 (age, gender, vascular disease, HTN, ?TIA - head CT in past with prior lacunar infarcts).    She had her colonoscopy (surveillance for Colon CA treatment) cancelled earlier this week due to uncontrolled HR.  I saw her as an add-on and increased her Metoprolol.  She c/o dyspnea as well.  I had her come back for an echo and FU today.  Her BNP was mildly elevated and I asked her to take Lasix for a couple days.    She continue to note dyspnea with mild activity.  She is fairly sedentary. She walks with a walker.  She sleeps in a recliner b/c of back and leg pain mainly.  Denies syncope.  She has had occasional episodes of chest tightness in the past. This does not occur with exertion and does not seem to be getting worse.  She denies any symptoms reminiscent of her previous angina.  She denies significant cough. She misunderstood her directions from her labs.  She was taking  Spironolactone 25 mg QD (we did not know she was taking it at last visit).  Instead of taking the Lasix, she increased her Spironolactone to 25 mg bid.     Past Medical History  Diagnosis Date  . CAD (coronary artery disease)     a. LHC 4/09: pLAD 40, mDx 70-75, pLCx 30, mLCx 50, mRCA 90, EF 60% >> PCI: BMS x2 to RCA;  b. Myoview 8/12: normal  . Essential hypertension   . Chronic diastolic CHF (congestive heart failure) (HCC)     a. Echo 912 - Mild LVH, EF 55-60%, no RWMA, Gr 1 DD, mild MR, mild LAE, mild RAE, PASP 59 mmHg (mod to severe pulmo HTN)  . History of TIA (transient ischemic attack)     a. Head CT 6/15: small chronic lacunar infarct in thalamus  . Hyperlipidemia   . Depression   . COPD with chronic bronchitis ([place])   . GERD (gastroesophageal reflux disease)   . History of hiatal hernia   . History of DVT of lower extremity   . Sigmoid diverticulosis     s/p sigmoid colectomy  . Colovaginal fistula     s/p colostomy >> colostomy takedown 5/16  . History of adenomatous polyp of colon   . Renal insufficiency   . OA (osteoarthritis)   . Wears glasses   . Wears dentures   . Colostomy in place (  [place])     s/p colostomy takedown 5/16  . Peripheral neuropathy ([place])   . Adenocarcinoma, colon [facility]) Oncologist-- Dr Truitt Merle    Multifocal (2) Colon cancer @ ileocecal valve and ascending ( [id number]), Stage IIIB, grade I, MMR normal , 2 of 16 +lymph nodes, negative surgical margins---  06-02-2014  Right hemicolectomy w/ colostomy  . Chronic anemia   . History of blood transfusion   . Allergy   . Anxiety   . Cataract     bil cateracts removed  . Pulmonary HTN ([place])     a. PASP on Echo in 9/12:  59 mmHg    Past Surgical History  Procedure Laterality Date  . Appendectomy    . Cholecystectomy    . Abdominal hysterectomy    . Lumbar laminectomy  10-29-2002,  1969    Left  L3 -- 4  decompression  . Total knee arthroplasty Bilateral left 1994/  right 2000  . Rotator cuff repair  Bilateral   . Cataract extraction w/ intraocular lens  implant, bilateral Bilateral   . Tonsillectomy    . Dilation and curettage of uterus    . Patellectomy Right 09/14/2013    Procedure: PATELLECTOMY;  Surgeon: Meredith Pel, MD;  Location: [place];  Service: Orthopedics;  Laterality: Right;  . Partial colectomy N/A 06/02/2014    Procedure: RIGHT PARTIAL COLECTOMY ;  Surgeon: Donnie Mesa, MD;  Location: [place];  Service: General;  Laterality: N/A;  . Colostomy N/A 06/02/2014    Procedure: DIVERTING DESCENDING END COLOSTOMY;  Surgeon: Donnie Mesa, MD;  Location: [place];  Service: General;  Laterality: N/A;  . Revision total knee arthroplasty Bilateral right  04-02-2011/  left 1996 & 10-05-1999  . Anterior cervical decomp/discectomy fusion  01-31-2009    C5 -- 7  . Cardiovascular stress test  02-26-2011    Normal lexiscan no exercise study/  no ischemia/  normal LV function and wall motion, ef 67%  . Transthoracic echocardiogram  12-01-2009    Grade I diastolic dysfunction/  ef 60%/  moderate MR/  mild TR  . Evaluation under anesthesia with anal fistulectomy N/A 10/13/2014    Procedure: ANAL EXAM UNDER ANESTHESIA ;  Surgeon: [name], MD;  Location: [facility];  Service: General;  Laterality: N/A;  . Proctoscopy N/A 10/13/2014    Procedure: RIDGE PROCTOSCOPY;  Surgeon: [name], MD;  Location: [facility];  Service: General;  Laterality: N/A;  . Coronary angioplasty with stent placement  11-04-2007  dr [email or web address]    BMS x2 to RCA/  mild Non-obstructive disease LAD/  normal LVF  . Bone spurs Bilateral     Feet  . Hand surgery      Tendon repair  . Carpal tunnel release Right   . Laparoscopic sigmoid colectomy N/A 11/24/2014    Procedure: SIGMOID COLECTOMY AND COLSOTOMY CLOSURE;  Surgeon: Donnie Mesa, MD;  Location: [place];  Service: General;  Laterality: N/A;    Previous Medications   ACETAMINOPHEN (TYLENOL) 325 MG TABLET    Take 650 mg by mouth every 6 (six) hours as needed for  moderate pain.    ASCORBIC ACID (VITAMIN C) 500 MG CAPS    Take 500 mg by mouth daily.    ASPIRIN EC 325 MG TABLET    Take 1 tablet (325 mg total) by mouth daily.   BENAZEPRIL (LOTENSIN) 40 MG TABLET    Take 40 mg by mouth daily.   CEPHALEXIN (KEFLEX) 500 MG CAPSULE    Take  1 capsule (500 mg total) by mouth 4 (four) times daily.   CHOLECALCIFEROL (VITAMIN D) 1000 UNITS TABLET    Take 1,000 Units by mouth daily.   ESOMEPRAZOLE (NEXIUM) 20 MG CAPSULE    Take 20 mg by mouth daily at 12 noon.   FERROUS SULFATE 325 (65 FE) MG TABLET    Take 1 tablet (325 mg total) by mouth 2 (two) times daily with a meal.   GABAPENTIN (NEURONTIN) 600 MG TABLET    Take 1 tablet by mouth 3 (three) times daily.   HYDROCODONE-ACETAMINOPHEN (NORCO/VICODIN) 5-325 MG TABLET    Take 1 tablet by mouth every 6 (six) hours as needed for moderate pain.   LORAZEPAM (ATIVAN) 0.5 MG TABLET    Take 0.5 mg by mouth 2 (two) times daily.   METOPROLOL (LOPRESSOR) 50 MG TABLET    Take 1.5 tablets (75 mg total) by mouth 2 (two) times daily.   NITROGLYCERIN (NITROSTAT) 0.4 MG SL TABLET    Place 0.4 mg under the tongue every 5 (five) minutes as needed for chest pain (x 3 pills). Reported on 07/25/2015   POLYETHYL GLYCOL-PROPYL GLYCOL (SYSTANE OP)    Place 1 drop into both eyes 2 (two) times daily as needed (for dry eyes).   PROMETHAZINE (PHENERGAN) 12.5 MG TABLET    Take 1 tablet by mouth every 6 (six) hours as needed for nausea or vomiting. Nausea/vomitting   SERTRALINE (ZOLOFT) 50 MG TABLET    Take 1 tablet by mouth daily. Reported on 07/25/2015   SPIRONOLACTONE (ALDACTONE) 25 MG TABLET    Take 25 mg by mouth daily.   TRAMADOL (ULTRAM) 50 MG TABLET    Take 50 mg by mouth every 6 (six) hours as needed for moderate pain.   TRAZODONE (DESYREL) 100 MG TABLET    Take 50 mg by mouth at bedtime. Take one half tablet (50 mg) as needed at bedtime for sleep   TRIAMCINOLONE CREAM (KENALOG) 0.1 %    Apply 1 application topically 2 (two) times daily as  needed (Eczema on legs). Reported on 07/25/2015     Allergies:   Review of patient's allergies indicates no known allergies.   Social History   Social History  . Marital Status: Married    Spouse Name: N/A  . Number of Children: N/A  . Years of Education: N/A   Social History Main Topics  . Smoking status: Former Smoker -- 0.50 packs/day for 15 years    Types: Cigarettes    Quit date: 07/16/1967  . Smokeless tobacco: Never Used  . Alcohol Use: No  . Drug Use: No  . Sexual Activity: Not Asked   Other Topics Concern  . None   Social History Narrative   Married, 2 children.  As of November 2015 her husband has been living in a nursing home for tendon after years as he suffers from multiple medical problems.   Patient does not drink alcohol nor does she smoke or chew tobacco products   Right handed   8th grade   1 cup daily     Family History:  The patient's family history includes Cancer (age of onset: 8) in her brother; Cancer (age of onset: 17) in her sister; Cancer (age of onset: 22) in her maternal uncle; Colon cancer in her maternal uncle; Heart failure in her mother; Osteoarthritis in her mother. There is no history of Esophageal cancer, Rectal cancer, or Stomach cancer.   ROS:   Please see the history of present illness.  ROS  All other systems reviewed and are negative.   PHYSICAL EXAM:   VS:  BP 120/60 mmHg  Pulse 70  Ht '5\' 3"'$  (1.6 m)  Wt 180 lb 12.8 oz (82.01 kg)  BMI 32.04 kg/m2   GEN: Well nourished, well developed, in no acute distress HEENT: normal Neck: no JVD at 90 degrees, no masses Cardiac: Normal S1/S2, irregularly irregular rhythm; no murmurs Respiratory:  clear to auscultation bilaterally; no wheezing, rhonchi or rales GI: soft, nontender MS: no deformity or atrophy Skin: warm and dry  Neuro:  no focal deficits  Psych: Alert and oriented x 3, normal affect   Wt Readings from Last 3 Encounters:  07/27/15 180 lb 12.8 oz (82.01 kg)    07/25/15 179 lb 6.4 oz (81.375 kg)  07/25/15 184 lb (83.462 kg)      Studies/Labs Reviewed:   EKG:  EKG is ordered today.  The ekg ordered today demonstrates AFib, HR 99, normal axis, NSSTTW changes.    Recent Labs: 04/26/2015: ALT 18; HGB 12.3; Platelets 156 07/25/2015: BUN 21; Creat 1.27*; Potassium 4.2; Sodium 141   Recent Lipid Panel    Component Value Date/Time   CHOL 130 01/19/2013 1014   TRIG 87.0 01/19/2013 1014   HDL 33.60* 01/19/2013 1014   CHOLHDL 4 01/19/2013 1014   VLDL 17.4 01/19/2013 1014   LDLCALC 79 01/19/2013 1014    Additional studies/ records that were reviewed today include:   Echo 07/27/15 Mild LVH, EF 50-55%, no RWMA, mod MR, severe LAE, mild RAE, PASP 44 mmHg  Echo 9/12 Mild LVH, EF 55-60%, no RWMA, Gr 1 DD, mild MR, mild LAE, mild RAE, PASP 59 mmHg (mod to severe pulmo HTN)  Myoview 8/12 Normal stress nuclear study  LHC 4/09 LM ok LAD calcified, prox and mid 40%, Dx mid 70-75% LCx prox 30%, mid AV groove 50% RCA mid 90% EF 60% PCI: [facility] BMS x 2 to mRCA   ASSESSMENT:    1. Persistent atrial fibrillation ([place])   2. Coronary artery disease involving native coronary artery of native heart without angina pectoris   3. Chronic diastolic CHF (congestive heart failure) ([place])   4. Essential hypertension   5. Osteoarthritis, unspecified osteoarthritis type, unspecified site     PLAN:  In order of problems listed above:  1. Persistent AFib - Heart rate is better controlled. She is somewhat of a poor historian. However, it does not seem that she is that symptomatic at this time.  Recent Echo today with normal LVF.  Continue metoprolol tartrate 75 mg twice a day.  CHADS2-VASc=7.  She needs to start on anticoagulation after her knee surgery as soon as felt to be safe from a surgical standpoint.   She could either be placed on Coumadin or Eliquis 5 mg bid (Creatinine < 1.5, weight > 60 kg).  Our service could certainly see her in the hospital to  help determine her anticoagulation if desired by orthopedics.   I will make sure she has early FU with me in a few weeks to FU on her anticoagulation.   2. CAD - No recurrent angina.  Echo today with normal LVF and normal wall motion.Continue aspirin, beta blocker.  3. Diastolic HF -  Recent BNP mildly elevated.  I wanted to increase her Lasix.  She mistakenly changed her Spironolactone.  Loss of atrial kick has likely helped to contribute to some volume excess.   -  Take Lasix 20 mg x 1 today and x 1 tomorrow.   -  Then take Lasix 20 mg every Mon, Wed, Fri  -  Arrange BMET in several weeks.  4. HTN -  BP controlled.   5. DJD - She has knee TKR upcoming next week. Continue beta-blocker to help keep HR controlled and to reduce the risk of perioperative cardiovascular complications.     Medication Adjustments/Labs and Tests Ordered: Current medicines are reviewed at length with the patient today.  Concerns regarding medicines are outlined above.  Medication changes, Labs and Tests ordered today are outlined in the Patient Instructions noted below.  Signed, Richardson Dopp, PA-C  07/27/2015 5:27 PM    [facility] [address], [place], [place]  [address] Phone: [phone]; Fax: [phone]    Patient Instructions  Medication Instructions:  Decrease Spironolactone to 25 mg ONCE daily and take 20 mg of Furosemide daily today and tomorrow then decrease to 20 mg daily on Mondays, Wednesdays and Fridays thereafter.    Labwork: We will be drawing a BMET on your next office visit with Richardson Dopp, PA-c.  Testing/Procedures: None  Follow-Up: Your physician recommends that you schedule a follow-up appointment in: 4 to 5 weeks with Richardson Dopp, PA-c  If you need a refill on your cardiac medications before your next appointment, please call your pharmacy.

## 2015-07-26 NOTE — Telephone Encounter (Signed)
DPR [name] cb and has been notified of lab results and to have pt take lasix today and tomorrow AM; then change taking lasix to Mon, Wed and Fri's at this time. We will get Bmet 07/31/15 per Richardson Dopp, PA. I asked daughter if she knew the dose of lasix pt is on; daughter said not sure. Advised bring lasix bottle so that we [place] update chart w/the correct dose. [name] agreeable to plan of care and instructions.

## 2015-07-27 ENCOUNTER — Encounter: Payer: Self-pay | Admitting: Physician Assistant

## 2015-07-27 ENCOUNTER — Ambulatory Visit (INDEPENDENT_AMBULATORY_CARE_PROVIDER_SITE_OTHER): Payer: Commercial Managed Care - HMO | Admitting: Physician Assistant

## 2015-07-27 ENCOUNTER — Ambulatory Visit (HOSPITAL_COMMUNITY)
Admission: RE | Admit: 2015-07-27 | Discharge: 2015-07-27 | Disposition: A | Discharge status: Home / Self Care | Payer: Commercial Managed Care - HMO | Source: Ambulatory Visit | Attending: Physician Assistant | Admitting: Physician Assistant

## 2015-07-27 VITALS — BP 120/60 | HR 70 | Ht 63.0 in | Wt 180.8 lb

## 2015-07-27 DIAGNOSIS — Z87891 Personal history of nicotine dependence: Secondary | ICD-10-CM | POA: Insufficient documentation

## 2015-07-27 DIAGNOSIS — Z01818 Encounter for other preprocedural examination: Secondary | ICD-10-CM | POA: Diagnosis not present

## 2015-07-27 DIAGNOSIS — M1990 Unspecified osteoarthritis, unspecified site: Secondary | ICD-10-CM

## 2015-07-27 DIAGNOSIS — I2510 Atherosclerotic heart disease of native coronary artery without angina pectoris: Secondary | ICD-10-CM

## 2015-07-27 DIAGNOSIS — I481 Persistent atrial fibrillation: Secondary | ICD-10-CM | POA: Diagnosis not present

## 2015-07-27 DIAGNOSIS — I10 Essential (primary) hypertension: Secondary | ICD-10-CM

## 2015-07-27 DIAGNOSIS — I4891 Unspecified atrial fibrillation: Secondary | ICD-10-CM | POA: Diagnosis not present

## 2015-07-27 DIAGNOSIS — I5032 Chronic diastolic (congestive) heart failure: Secondary | ICD-10-CM | POA: Diagnosis not present

## 2015-07-27 DIAGNOSIS — I4819 Other persistent atrial fibrillation: Secondary | ICD-10-CM

## 2015-07-27 MED ORDER — FUROSEMIDE 20 MG PO TABS: ORAL_TABLET | ORAL | Status: DC

## 2015-07-27 NOTE — Patient Instructions (Addendum)
Medication Instructions:  Decrease Spironolactone to 25 mg ONCE daily and take 20 mg of Furosemide daily today and tomorrow then decrease to 20 mg daily on Mondays, Wednesdays and Fridays thereafter.    Labwork: We will be drawing a BMET on your next office visit with Richardson Dopp, PA-c.  Testing/Procedures: None  Follow-Up: Your physician recommends that you schedule a follow-up appointment in: 4 to 5 weeks with Richardson Dopp, PA-c  If you need a refill on your cardiac medications before your next appointment, please call your pharmacy.

## 2015-07-27 NOTE — Progress Notes (Signed)
  Echocardiogram 2D Echocardiogram has been performed.  Jennette Dubin 07/27/2015, 9:55 AM

## 2015-07-28 ENCOUNTER — Encounter (HOSPITAL_COMMUNITY): Payer: Self-pay

## 2015-07-28 ENCOUNTER — Encounter (HOSPITAL_COMMUNITY)
Admission: RE | Admit: 2015-07-28 | Discharge: 2015-07-28 | Disposition: A | Discharge status: Home / Self Care | Payer: Commercial Managed Care - HMO | Source: Ambulatory Visit | Attending: Orthopedic Surgery | Admitting: Orthopedic Surgery

## 2015-07-28 DIAGNOSIS — Z0183 Encounter for blood typing: Secondary | ICD-10-CM | POA: Insufficient documentation

## 2015-07-28 DIAGNOSIS — Z01812 Encounter for preprocedural laboratory examination: Secondary | ICD-10-CM | POA: Diagnosis present

## 2015-07-28 DIAGNOSIS — T84033A Mechanical loosening of internal left knee prosthetic joint, initial encounter: Secondary | ICD-10-CM | POA: Diagnosis not present

## 2015-07-28 HISTORY — DX: Cardiac arrhythmia, unspecified: I49.9

## 2015-07-28 LAB — URINALYSIS, ROUTINE W REFLEX MICROSCOPIC
Bilirubin Urine: NEGATIVE
Glucose, UA: NEGATIVE mg/dL
Hgb urine dipstick: NEGATIVE
Ketones, ur: NEGATIVE mg/dL
Leukocytes, UA: NEGATIVE
Nitrite: NEGATIVE
Protein, ur: NEGATIVE mg/dL
Specific Gravity, Urine: 1.009 (ref 1.005–1.030)
pH: 5.5 (ref 5.0–8.0)

## 2015-07-28 LAB — COMPREHENSIVE METABOLIC PANEL
ALT: 29 U/L (ref 14–54)
AST: 31 U/L (ref 15–41)
Albumin: 4.3 g/dL (ref 3.5–5.0)
Alkaline Phosphatase: 71 U/L (ref 38–126)
Anion gap: 12 (ref 5–15)
BUN: 33 mg/dL — ABNORMAL HIGH (ref 6–20)
CO2: 26 mmol/L (ref 22–32)
Calcium: 9.5 mg/dL (ref 8.9–10.3)
Chloride: 104 mmol/L (ref 101–111)
Creatinine, Ser: 1.61 mg/dL — ABNORMAL HIGH (ref 0.44–1.00)
GFR calc Af Amer: 33 mL/min — ABNORMAL LOW (ref >60)
GFR calc non Af Amer: 28 mL/min — ABNORMAL LOW (ref >60)
Glucose, Bld: 101 mg/dL — ABNORMAL HIGH (ref 65–99)
Potassium: 4.7 mmol/L (ref 3.5–5.1)
Sodium: 142 mmol/L (ref 135–145)
Total Bilirubin: 0.6 mg/dL (ref 0.3–1.2)
Total Protein: 8.2 g/dL — ABNORMAL HIGH (ref 6.5–8.1)

## 2015-07-28 LAB — CBC
HCT: 40.6 % (ref 36.0–46.0)
Hemoglobin: 12.9 g/dL (ref 12.0–15.0)
MCH: 29.9 pg (ref 26.0–34.0)
MCHC: 31.8 g/dL (ref 30.0–36.0)
MCV: 94.2 fL (ref 78.0–100.0)
Platelets: 203 10*3/uL (ref 150–400)
RBC: 4.31 MIL/uL (ref 3.87–5.11)
RDW: 14.6 % (ref 11.5–15.5)
WBC: 8.7 10*3/uL (ref 4.0–10.5)

## 2015-07-28 LAB — PROTIME-INR
INR: 1.2 (ref 0.00–1.49)
Prothrombin Time: 15.3 seconds — ABNORMAL HIGH (ref 11.6–15.2)

## 2015-07-28 LAB — SURGICAL PCR SCREEN
MRSA, PCR: NEGATIVE
Staphylococcus aureus: POSITIVE — AB

## 2015-07-28 LAB — APTT: aPTT: 35 seconds (ref 24–37)

## 2015-07-28 NOTE — Pre-Procedure Instructions (Addendum)
LOV Cardio, Richardson Dopp [place], epic 07-25-15 Medical Clearance, Dr. Burnice Logan, on chart Cardiac Clearance, Dr. Tempie Hoist, on chart DG abd acute with chest 09-01-14, epic EKG 07-25-15 epic  Pt states will stop Aspirin today  Pt MRSA results showed positive for Staph.  Called in prescription. Spoke with patient over phone and instructed her to pick up prescription and follow instructions I gave her.

## 2015-08-01 NOTE — H&P (Signed)
TOTAL KNEE REVISION ADMISSION H&P  Patient is being admitted for left revision total knee arthroplasty.  Subjective:  Chief Complaint:left knee pain.  HPI: [name], 82 y.o. female, has a history of pain and functional disability in the left knee(s) due to arthritis and failed previous arthroplasty and patient has failed non-surgical conservative treatments for greater than 12 weeks to include NSAID's and/or analgesics, corticosteriod injections, flexibility and strengthening excercises, supervised PT with diminished ADL's post treatment, use of assistive devices and activity modification. The indications for the revision of the total knee arthroplasty are loosening of one or more components and progressive or substantial perporsthetic bone loss. Onset of symptoms was gradual starting 5 years ago with gradually worsening course since that time.  Prior procedures on the left knee(s) include arthroscopy, menisectomy, arthroplasty and arthroplasty, revision arthroplasty, and patellectomy.  Patient currently rates pain in the left knee(s) at 8 out of 10 with activity. There is night pain, worsening of pain with activity and weight bearing, pain that interferes with activities of daily living, pain with passive range of motion, crepitus and joint swelling.  Patient has evidence of prosthetic loosening by imaging studies. This condition presents safety issues increasing the risk of falls.  There is no current active infection.  Patient Active Problem List   Diagnosis Date Noted  . Sigmoid diverticulitis 11/24/2014  . Acute blood loss anemia 06/13/2014  . Neuropathic pain 06/13/2014  . Insomnia 06/13/2014  . Anxiety and depression 06/13/2014  . Bacterial skin infection 06/13/2014  . Cancer of ascending colon ([place]) 06/13/2014  . Generalized weakness 06/13/2014  . Small bowel obstruction ([place]) 05/31/2014  . Colon tumor 05/31/2014  . Colovaginal fistula 04/20/2014  . Weakness 12/27/2013  .  Asterixis 12/21/2013  . Hypoxia 09/15/2013  . Lethargy 09/15/2013  . Hypotension, unspecified 09/15/2013  . Dehydration 09/15/2013  . Patella fracture 09/14/2013  . Chest pain 02/19/2011  . Pre-operative cardiovascular examination 02/19/2011  . DEPRESSION 02/07/2010  . EDEMA 11/13/2009  . DYSPNEA 11/13/2009  . DIASTOLIC HEART FAILURE, CHRONIC 02/27/2009  . PURE HYPERCHOLESTEROLEMIA 04/05/2008  . Coronary atherosclerosis 04/05/2008  . Anemia 06/30/2007  . Osteoarthritis 05/19/2007  . Essential hypertension 01/21/2007  . GERD 01/21/2007  . HIATAL HERNIA WITH REFLUX 01/21/2007  . Diverticulosis of large intestine 01/21/2007  . COLONIC POLYPS, HX OF 01/21/2007   Past Medical History  Diagnosis Date  . CAD (coronary artery disease)     a. LHC 4/09: pLAD 40, mDx 70-75, pLCx 30, mLCx 50, mRCA 90, EF 60% >> PCI: BMS x2 to RCA;  b. Myoview 8/12: normal  . Essential hypertension   . Chronic diastolic CHF (congestive heart failure) (HCC)     a. Echo 912 - Mild LVH, EF 55-60%, no RWMA, Gr 1 DD, mild MR, mild LAE, mild RAE, PASP 59 mmHg (mod to severe pulmo HTN)  . History of TIA (transient ischemic attack)     a. Head CT 6/15: small chronic lacunar infarct in thalamus  . Hyperlipidemia   . Depression   . COPD with chronic bronchitis ([place])   . GERD (gastroesophageal reflux disease)   . History of hiatal hernia   . History of DVT of lower extremity   . Sigmoid diverticulosis     s/p sigmoid colectomy  . Colovaginal fistula     s/p colostomy >> colostomy takedown 5/16  . History of adenomatous polyp of colon   . Renal insufficiency   . OA (osteoarthritis)   . Wears glasses   .  Wears dentures   . Colostomy in place [facility])     s/p colostomy takedown 5/16  . Peripheral neuropathy ([place])   . Adenocarcinoma, colon [facility]) Oncologist-- Dr Truitt Merle    Multifocal (2) Colon cancer @ ileocecal valve and ascending ( [id number]), Stage IIIB, grade I, MMR normal , 2 of 16 +lymph nodes, negative  surgical margins---  06-02-2014  Right hemicolectomy w/ colostomy  . Chronic anemia   . History of blood transfusion   . Allergy   . Anxiety   . Cataract     bil cateracts removed  . Pulmonary HTN ([place])     a. PASP on Echo in 9/12:  59 mmHg  . Dysrhythmia     A fib  . Stroke ([place])     TIAs  . Pneumonia     Past Surgical History  Procedure Laterality Date  . Appendectomy    . Cholecystectomy    . Abdominal hysterectomy    . Lumbar laminectomy  10-29-2002,  1969    Left  L3 -- 4  decompression  . Total knee arthroplasty Bilateral left 1994/  right 2000  . Rotator cuff repair Bilateral   . Cataract extraction w/ intraocular lens  implant, bilateral Bilateral   . Tonsillectomy    . Dilation and curettage of uterus    . Patellectomy Right 09/14/2013    Procedure: PATELLECTOMY;  Surgeon: Meredith Pel, MD;  Location: [place];  Service: Orthopedics;  Laterality: Right;  . Partial colectomy N/A 06/02/2014    Procedure: RIGHT PARTIAL COLECTOMY ;  Surgeon: Donnie Mesa, MD;  Location: [place];  Service: General;  Laterality: N/A;  . Colostomy N/A 06/02/2014    Procedure: DIVERTING DESCENDING END COLOSTOMY;  Surgeon: Donnie Mesa, MD;  Location: [place];  Service: General;  Laterality: N/A;  . Revision total knee arthroplasty Bilateral right  04-02-2011/  left 1996 & 10-05-1999  . Anterior cervical decomp/discectomy fusion  01-31-2009    C5 -- 7  . Cardiovascular stress test  02-26-2011    Normal lexiscan no exercise study/  no ischemia/  normal LV function and wall motion, ef 67%  . Transthoracic echocardiogram  12-01-2009    Grade I diastolic dysfunction/  ef 60%/  moderate MR/  mild TR  . Evaluation under anesthesia with anal fistulectomy N/A 10/13/2014    Procedure: ANAL EXAM UNDER ANESTHESIA ;  Surgeon: [name], MD;  Location: [facility];  Service: General;  Laterality: N/A;  . Proctoscopy N/A 10/13/2014    Procedure: RIDGE PROCTOSCOPY;  Surgeon: [name], MD;  Location: [facility];  Service: General;  Laterality: N/A;  . Coronary angioplasty with stent placement  11-04-2007  dr [email or web address]    BMS x2 to RCA/  mild Non-obstructive disease LAD/  normal LVF  . Bone spurs Bilateral     Feet  . Hand surgery      Tendon repair  . Carpal tunnel release Right   . Laparoscopic sigmoid colectomy N/A 11/24/2014    Procedure: SIGMOID COLECTOMY AND COLSOTOMY CLOSURE;  Surgeon: Donnie Mesa, MD;  Location: [place];  Service: General;  Laterality: N/A;      Current outpatient prescriptions:  .  Ascorbic Acid (VITAMIN C) 500 MG CAPS, Take 500 mg by mouth daily. , Disp: , Rfl:  .  cholecalciferol (VITAMIN D) 1000 UNITS tablet, Take 1,000 Units by mouth daily., Disp: , Rfl:  .  esomeprazole (NEXIUM) 20 MG capsule, Take 20 mg by mouth daily at 12 noon., Disp: ,  Rfl:  .  ferrous sulfate 325 (65 FE) MG tablet, Take 1 tablet (325 mg total) by mouth 2 (two) times daily with a meal., Disp: , Rfl: 3 .  HYDROcodone-acetaminophen (NORCO/VICODIN) 5-325 MG tablet, Take 1 tablet by mouth every 6 (six) hours as needed for moderate pain., Disp: 90 tablet, Rfl: 0 .  nitroGLYCERIN (NITROSTAT) 0.4 MG SL tablet, Place 0.4 mg under the tongue every 5 (five) minutes as needed for chest pain (x 3 pills). Reported on 07/25/2015, Disp: , Rfl:  .  Polyethyl Glycol-Propyl Glycol (SYSTANE OP), Place 1 drop into both eyes 2 (two) times daily as needed (for dry eyes)., Disp: , Rfl:  .  promethazine (PHENERGAN) 12.5 MG tablet, Take 1 tablet by mouth every 6 (six) hours as needed for nausea or vomiting. Nausea/vomitting, Disp: , Rfl:  .  triamcinolone cream (KENALOG) 0.1 %, Apply 1 application topically 2 (two) times daily as needed (Eczema on legs). Reported on 07/25/2015, Disp: , Rfl:  .  acetaminophen (TYLENOL) 325 MG tablet, Take 650 mg by mouth every 6 (six) hours as needed for moderate pain. , Disp: , Rfl:  .  aspirin EC 325 MG tablet, Take 1 tablet (325 mg total) by mouth daily., Disp: 30 tablet, Rfl: 0 .   benazepril (LOTENSIN) 40 MG tablet, Take 40 mg by mouth daily., Disp: , Rfl:  .  cephALEXin (KEFLEX) 500 MG capsule, Take 1 capsule (500 mg total) by mouth 4 (four) times daily., Disp: 40 capsule, Rfl: 0 .  furosemide (LASIX) 20 MG tablet, Take 20 mg daily today and tomorrow then decrease to 20 mg daily on Mondays, Wednesdays and Fridays, Disp: 20 tablet, Rfl: 3 .  gabapentin (NEURONTIN) 600 MG tablet, Take 1 tablet by mouth 3 (three) times daily., Disp: , Rfl:  .  LORazepam (ATIVAN) 0.5 MG tablet, Take 0.5 mg by mouth 2 (two) times daily., Disp: , Rfl:  .  metoprolol (LOPRESSOR) 50 MG tablet, Take 1.5 tablets (75 mg total) by mouth 2 (two) times daily., Disp: 270 tablet, Rfl: 3 .  sertraline (ZOLOFT) 50 MG tablet, Take 1 tablet by mouth daily. Reported on 07/25/2015, Disp: , Rfl:  .  spironolactone (ALDACTONE) 25 MG tablet, Take 25 mg by mouth daily., Disp: , Rfl:  .  traMADol (ULTRAM) 50 MG tablet, Take 50 mg by mouth every 6 (six) hours as needed for moderate pain., Disp: , Rfl:  .  traZODone (DESYREL) 100 MG tablet, Take 50 mg by mouth at bedtime. Take one half tablet (50 mg) as needed at bedtime for sleep, Disp: , Rfl:   No Known Allergies  Social History  Substance Use Topics  . Smoking status: Former Smoker -- 0.50 packs/day for 15 years    Types: Cigarettes    Quit date: 07/16/1967  . Smokeless tobacco: Never Used  . Alcohol Use: No    Family History  Problem Relation Age of Onset  . Osteoarthritis Mother   . Heart failure Mother   . Cancer Sister 38    ? colon cancer   . Cancer Brother 30    Kidney cancer   . Cancer Maternal Uncle 7    Colon cancer   . Colon cancer Maternal Uncle   . Esophageal cancer Neg Hx   . Rectal cancer Neg Hx   . Stomach cancer Neg Hx       Review of Systems  Constitutional: Positive for fever and malaise/fatigue. Negative for chills, weight loss and diaphoresis.  Fever associated with sinusitis  HENT: Negative for congestion, ear  discharge, ear pain, hearing loss, nosebleeds, sore throat and tinnitus.   Eyes: Negative.   Respiratory: Positive for shortness of breath. Negative for cough, hemoptysis, sputum production, wheezing and stridor.        SOB with exertion  Cardiovascular: Negative.   Gastrointestinal: Positive for heartburn. Negative for nausea, vomiting, abdominal pain, diarrhea, constipation, blood in stool and melena.  Genitourinary: Negative.   Musculoskeletal: Positive for myalgias, back pain and joint pain. Negative for falls and neck pain.       Left knee pain  Skin: Negative for itching and rash.       Healing laceration about the anterolateral aspect of the left lower leg  Neurological: Positive for headaches. Negative for dizziness, tingling, tremors, sensory change, speech change, focal weakness, seizures, loss of consciousness and weakness.  Endo/Heme/Allergies: Negative.   Psychiatric/Behavioral: Negative.      Objective:  Physical Exam  Constitutional: She is oriented to person, place, and time. She appears well-developed. No distress.  Obese  HENT:  Head: Normocephalic and atraumatic.  Right Ear: External ear normal.  Left Ear: External ear normal.  Nose: Nose normal.  Mouth/Throat: Oropharynx is clear and moist.  Eyes: Conjunctivae and EOM are normal.  Neck: Normal range of motion. Neck supple.  Cardiovascular: Normal rate, regular rhythm, normal heart sounds and intact distal pulses.   No murmur heard. Respiratory: Effort normal and breath sounds normal. No respiratory distress. She has no wheezes.  GI: Soft. Bowel sounds are normal. She exhibits no distension.  Musculoskeletal:       Right hip: Normal.       Left hip: Normal.       Right knee: Normal.       Left knee: She exhibits decreased range of motion and swelling. She exhibits no effusion and no erythema. Tenderness found. Medial joint line and lateral joint line tenderness noted.  Evaluation of her left hip, normal  motion, no discomfort. Her left knee shows varus. There is no effusion. Range about 0 to 110. There is no instability.  Neurological: She is alert and oriented to person, place, and time. She has normal strength and normal reflexes. No sensory deficit.  Skin: Laceration noted. No rash noted. She is not diaphoretic. No erythema.     Psychiatric: She has a normal mood and affect. Her behavior is normal.    Vitals  Weight: 184 lb Height: 63.5in Body Surface Area: 1.88 m Body Mass Index: 32.08 kg/m  Pulse: 84 (Regular)  BP: 116/70 (Sitting, Left Arm, Standard)  Imaging Review Plain radiographs demonstrate overall alignment is mild varus.There is evidence of loosening of the femoral components. The bone quality appears to be poor for age and reported activity level. She has a fixed bearing knee and with a revision tibia. The femur is grossly loose and significant varus and there is moderate bone loss on the medial femur.  Assessment/Plan:  End stage primary osteoarthritis, left knee(s) with failed previous arthroplasty.   The patient history, physical examination, clinical judgment of the provider and imaging studies are consistent with end stage degenerative joint disease of the left knee(s), previous total knee arthroplasty. Revision total knee arthroplasty is deemed medically necessary. The treatment options including medical management, injection therapy, arthroscopy and revision arthroplasty were discussed at length. The risks and benefits of revision total knee arthroplasty were presented and reviewed. The risks due to aseptic loosening, infection, stiffness, patella tracking problems, thromboembolic complications and  other imponderables were discussed. The patient acknowledged the explanation, agreed to proceed with the plan and consent was signed. Patient is being admitted for inpatient treatment for surgery, pain control, PT, OT, prophylactic antibiotics, VTE prophylaxis,  progressive ambulation and ADL's and discharge planning.The patient is planning to be discharged home with home health services    PCP: Dr. Barnie Del Cardio: Dr. Lisa Roca, PA-C

## 2015-08-02 ENCOUNTER — Inpatient Hospital Stay (HOSPITAL_COMMUNITY): Payer: Commercial Managed Care - HMO | Admitting: Anesthesiology

## 2015-08-02 ENCOUNTER — Inpatient Hospital Stay (HOSPITAL_COMMUNITY)
Admission: RE | Admit: 2015-08-02 | Discharge: 2015-08-04 | DRG: 467 | Disposition: A | Discharge status: Skilled Nursing Facility | Payer: Commercial Managed Care - HMO | Source: Ambulatory Visit | Attending: Orthopedic Surgery | Admitting: Orthopedic Surgery

## 2015-08-02 ENCOUNTER — Encounter (HOSPITAL_COMMUNITY): Payer: Self-pay | Admitting: *Deleted

## 2015-08-02 ENCOUNTER — Encounter (HOSPITAL_COMMUNITY)
Admission: RE | Disposition: A | Discharge status: Skilled Nursing Facility | Payer: Self-pay | Source: Ambulatory Visit | Attending: Orthopedic Surgery

## 2015-08-02 DIAGNOSIS — J449 Chronic obstructive pulmonary disease, unspecified: Secondary | ICD-10-CM | POA: Diagnosis present

## 2015-08-02 DIAGNOSIS — Y792 Prosthetic and other implants, materials and accessory orthopedic devices associated with adverse incidents: Secondary | ICD-10-CM | POA: Diagnosis present

## 2015-08-02 DIAGNOSIS — I2510 Atherosclerotic heart disease of native coronary artery without angina pectoris: Secondary | ICD-10-CM | POA: Diagnosis present

## 2015-08-02 DIAGNOSIS — Z85038 Personal history of other malignant neoplasm of large intestine: Secondary | ICD-10-CM

## 2015-08-02 DIAGNOSIS — K219 Gastro-esophageal reflux disease without esophagitis: Secondary | ICD-10-CM | POA: Diagnosis present

## 2015-08-02 DIAGNOSIS — G629 Polyneuropathy, unspecified: Secondary | ICD-10-CM | POA: Diagnosis present

## 2015-08-02 DIAGNOSIS — Z9049 Acquired absence of other specified parts of digestive tract: Secondary | ICD-10-CM

## 2015-08-02 DIAGNOSIS — Z8249 Family history of ischemic heart disease and other diseases of the circulatory system: Secondary | ICD-10-CM | POA: Diagnosis not present

## 2015-08-02 DIAGNOSIS — F329 Major depressive disorder, single episode, unspecified: Secondary | ICD-10-CM | POA: Diagnosis present

## 2015-08-02 DIAGNOSIS — M25562 Pain in left knee: Secondary | ICD-10-CM | POA: Diagnosis present

## 2015-08-02 DIAGNOSIS — Z01812 Encounter for preprocedural laboratory examination: Secondary | ICD-10-CM

## 2015-08-02 DIAGNOSIS — Z86718 Personal history of other venous thrombosis and embolism: Secondary | ICD-10-CM

## 2015-08-02 DIAGNOSIS — F419 Anxiety disorder, unspecified: Secondary | ICD-10-CM | POA: Diagnosis present

## 2015-08-02 DIAGNOSIS — Z8673 Personal history of transient ischemic attack (TIA), and cerebral infarction without residual deficits: Secondary | ICD-10-CM

## 2015-08-02 DIAGNOSIS — T84033A Mechanical loosening of internal left knee prosthetic joint, initial encounter: Secondary | ICD-10-CM | POA: Diagnosis present

## 2015-08-02 DIAGNOSIS — Z96653 Presence of artificial knee joint, bilateral: Secondary | ICD-10-CM | POA: Diagnosis present

## 2015-08-02 DIAGNOSIS — Z6831 Body mass index (BMI) 31.0-31.9, adult: Secondary | ICD-10-CM

## 2015-08-02 DIAGNOSIS — I5032 Chronic diastolic (congestive) heart failure: Secondary | ICD-10-CM | POA: Diagnosis present

## 2015-08-02 DIAGNOSIS — Z87891 Personal history of nicotine dependence: Secondary | ICD-10-CM | POA: Diagnosis not present

## 2015-08-02 DIAGNOSIS — E785 Hyperlipidemia, unspecified: Secondary | ICD-10-CM | POA: Diagnosis present

## 2015-08-02 DIAGNOSIS — I110 Hypertensive heart disease with heart failure: Secondary | ICD-10-CM | POA: Diagnosis present

## 2015-08-02 DIAGNOSIS — T84093A Other mechanical complication of internal left knee prosthesis, initial encounter: Secondary | ICD-10-CM

## 2015-08-02 HISTORY — PX: TOTAL KNEE REVISION: SHX996

## 2015-08-02 LAB — TYPE AND SCREEN
ABO/RH(D): A POS
Antibody Screen: NEGATIVE

## 2015-08-02 SURGERY — TOTAL KNEE REVISION
Anesthesia: Spinal | Site: Knee | Laterality: Left

## 2015-08-02 MED ORDER — FERROUS SULFATE 325 (65 FE) MG PO TABS
325.0000 mg | ORAL_TABLET | Freq: Two times a day (BID) | ORAL | Status: DC
  Administered 2015-08-03 – 2015-08-04 (×3): 325 mg via ORAL
  Filled 2015-08-02 (×5): qty 1

## 2015-08-02 MED ORDER — DEXAMETHASONE SODIUM PHOSPHATE 10 MG/ML IJ SOLN
10.0000 mg | Freq: Once | INTRAMUSCULAR | Status: AC
  Administered 2015-08-02: 10 mg via INTRAVENOUS

## 2015-08-02 MED ORDER — PROMETHAZINE HCL 25 MG PO TABS: 12.5000 mg | ORAL_TABLET | Freq: Four times a day (QID) | ORAL | Status: DC | PRN

## 2015-08-02 MED ORDER — SPIRONOLACTONE 25 MG PO TABS
25.0000 mg | ORAL_TABLET | Freq: Every day | ORAL | Status: DC
  Filled 2015-08-02: qty 1

## 2015-08-02 MED ORDER — DIPHENHYDRAMINE HCL 12.5 MG/5ML PO ELIX: 12.5000 mg | ORAL_SOLUTION | ORAL | Status: DC | PRN

## 2015-08-02 MED ORDER — BUPIVACAINE HCL (PF) 0.25 % IJ SOLN
INTRAMUSCULAR | Status: AC
  Filled 2015-08-02: qty 30

## 2015-08-02 MED ORDER — METHOCARBAMOL 500 MG PO TABS
500.0000 mg | ORAL_TABLET | Freq: Four times a day (QID) | ORAL | Status: DC | PRN
  Administered 2015-08-03 – 2015-08-04 (×3): 500 mg via ORAL
  Filled 2015-08-02 (×2): qty 1

## 2015-08-02 MED ORDER — METHOCARBAMOL 1000 MG/10ML IJ SOLN
500.0000 mg | Freq: Four times a day (QID) | INTRAVENOUS | Status: DC | PRN
  Filled 2015-08-02: qty 5

## 2015-08-02 MED ORDER — TRAZODONE HCL 50 MG PO TABS
50.0000 mg | ORAL_TABLET | Freq: Every day | ORAL | Status: DC
  Administered 2015-08-02 – 2015-08-03 (×2): 50 mg via ORAL
  Filled 2015-08-02 (×3): qty 1

## 2015-08-02 MED ORDER — ACETAMINOPHEN 10 MG/ML IV SOLN
1000.0000 mg | Freq: Once | INTRAVENOUS | Status: AC
  Administered 2015-08-02: 1000 mg via INTRAVENOUS
  Filled 2015-08-02: qty 100

## 2015-08-02 MED ORDER — PHENOL 1.4 % MT LIQD: 1.0000 | OROMUCOSAL | Status: DC | PRN

## 2015-08-02 MED ORDER — FENTANYL CITRATE (PF) 100 MCG/2ML IJ SOLN
25.0000 ug | INTRAMUSCULAR | Status: DC | PRN
  Administered 2015-08-02 (×2): 25 ug via INTRAVENOUS

## 2015-08-02 MED ORDER — FENTANYL CITRATE (PF) 100 MCG/2ML IJ SOLN
INTRAMUSCULAR | Status: DC | PRN
  Administered 2015-08-02 (×2): 50 ug via INTRAVENOUS

## 2015-08-02 MED ORDER — TRAMADOL HCL 50 MG PO TABS: 50.0000 mg | ORAL_TABLET | Freq: Four times a day (QID) | ORAL | Status: DC | PRN

## 2015-08-02 MED ORDER — METOPROLOL TARTRATE 50 MG PO TABS
75.0000 mg | ORAL_TABLET | Freq: Two times a day (BID) | ORAL | Status: DC
  Administered 2015-08-02 – 2015-08-04 (×3): 75 mg via ORAL
  Filled 2015-08-02 (×7): qty 1

## 2015-08-02 MED ORDER — CEFAZOLIN SODIUM-DEXTROSE 2-3 GM-% IV SOLR
2.0000 g | INTRAVENOUS | Status: AC
  Administered 2015-08-02: 2 g via INTRAVENOUS

## 2015-08-02 MED ORDER — SODIUM CHLORIDE 0.9 % IV SOLN
INTRAVENOUS | Status: DC
  Administered 2015-08-03: via INTRAVENOUS

## 2015-08-02 MED ORDER — LACTATED RINGERS IV SOLN
INTRAVENOUS | Status: DC | PRN
  Administered 2015-08-02 (×3): via INTRAVENOUS

## 2015-08-02 MED ORDER — MENTHOL 3 MG MT LOZG: 1.0000 | LOZENGE | OROMUCOSAL | Status: DC | PRN

## 2015-08-02 MED ORDER — FLEET ENEMA 7-19 GM/118ML RE ENEM
1.0000 | ENEMA | Freq: Once | RECTAL | Status: DC | PRN
Start: 2015-08-02 — End: 2015-08-04

## 2015-08-02 MED ORDER — DEXAMETHASONE SODIUM PHOSPHATE 10 MG/ML IJ SOLN
INTRAMUSCULAR | Status: AC
  Filled 2015-08-02: qty 1

## 2015-08-02 MED ORDER — PANTOPRAZOLE SODIUM 40 MG PO TBEC
40.0000 mg | DELAYED_RELEASE_TABLET | Freq: Every day | ORAL | Status: DC
  Filled 2015-08-02: qty 1

## 2015-08-02 MED ORDER — 0.9 % SODIUM CHLORIDE (POUR BTL) OPTIME
TOPICAL | Status: DC | PRN
  Administered 2015-08-02: 1000 mL

## 2015-08-02 MED ORDER — ONDANSETRON HCL 4 MG/2ML IJ SOLN: 4.0000 mg | Freq: Four times a day (QID) | INTRAMUSCULAR | Status: DC | PRN

## 2015-08-02 MED ORDER — PROPOFOL 10 MG/ML IV BOLUS
INTRAVENOUS | Status: AC
  Filled 2015-08-02: qty 20

## 2015-08-02 MED ORDER — ACETAMINOPHEN 325 MG PO TABS: 650.0000 mg | ORAL_TABLET | Freq: Four times a day (QID) | ORAL | Status: DC | PRN

## 2015-08-02 MED ORDER — ONDANSETRON HCL 4 MG PO TABS: 4.0000 mg | ORAL_TABLET | Freq: Four times a day (QID) | ORAL | Status: DC | PRN

## 2015-08-02 MED ORDER — PROPOFOL 500 MG/50ML IV EMUL
INTRAVENOUS | Status: DC | PRN
  Administered 2015-08-02: 75 ug/kg/min via INTRAVENOUS

## 2015-08-02 MED ORDER — MORPHINE SULFATE (PF) 2 MG/ML IV SOLN
1.0000 mg | INTRAVENOUS | Status: DC | PRN
  Administered 2015-08-02 – 2015-08-03 (×3): 1 mg via INTRAVENOUS
  Filled 2015-08-02 (×4): qty 1

## 2015-08-02 MED ORDER — METOCLOPRAMIDE HCL 5 MG/ML IJ SOLN: 5.0000 mg | Freq: Three times a day (TID) | INTRAMUSCULAR | Status: DC | PRN

## 2015-08-02 MED ORDER — MIDAZOLAM HCL 5 MG/5ML IJ SOLN
INTRAMUSCULAR | Status: DC | PRN
  Administered 2015-08-02 (×2): 0.5 mg via INTRAVENOUS

## 2015-08-02 MED ORDER — APIXABAN 2.5 MG PO TABS
2.5000 mg | ORAL_TABLET | Freq: Two times a day (BID) | ORAL | Status: DC
  Administered 2015-08-03 – 2015-08-04 (×3): 2.5 mg via ORAL
  Filled 2015-08-02 (×5): qty 1

## 2015-08-02 MED ORDER — FENTANYL CITRATE (PF) 100 MCG/2ML IJ SOLN
INTRAMUSCULAR | Status: AC
  Filled 2015-08-02: qty 2

## 2015-08-02 MED ORDER — CEFAZOLIN SODIUM-DEXTROSE 2-3 GM-% IV SOLR
2.0000 g | Freq: Four times a day (QID) | INTRAVENOUS | Status: AC
  Administered 2015-08-02 – 2015-08-03 (×2): 2 g via INTRAVENOUS
  Filled 2015-08-02 (×2): qty 50

## 2015-08-02 MED ORDER — LORAZEPAM 0.5 MG PO TABS
0.5000 mg | ORAL_TABLET | Freq: Two times a day (BID) | ORAL | Status: DC
  Administered 2015-08-02 – 2015-08-04 (×4): 0.5 mg via ORAL
  Filled 2015-08-02 (×4): qty 1

## 2015-08-02 MED ORDER — ACETAMINOPHEN 500 MG PO TABS
1000.0000 mg | ORAL_TABLET | Freq: Four times a day (QID) | ORAL | Status: AC
  Administered 2015-08-02 – 2015-08-03 (×4): 1000 mg via ORAL
  Filled 2015-08-02 (×4): qty 2

## 2015-08-02 MED ORDER — SODIUM CHLORIDE 0.9 % IJ SOLN
INTRAMUSCULAR | Status: AC
  Filled 2015-08-02: qty 30

## 2015-08-02 MED ORDER — PHENYLEPHRINE HCL 10 MG/ML IJ SOLN
10.0000 mg | INTRAVENOUS | Status: DC | PRN
  Administered 2015-08-02: 30 ug/min via INTRAVENOUS

## 2015-08-02 MED ORDER — MIDAZOLAM HCL 2 MG/2ML IJ SOLN
INTRAMUSCULAR | Status: AC
  Filled 2015-08-02: qty 2

## 2015-08-02 MED ORDER — ONDANSETRON HCL 4 MG/2ML IJ SOLN
INTRAMUSCULAR | Status: DC | PRN
  Administered 2015-08-02: 4 mg via INTRAVENOUS

## 2015-08-02 MED ORDER — CHLORHEXIDINE GLUCONATE 4 % EX LIQD: 60.0000 mL | Freq: Once | CUTANEOUS | Status: DC

## 2015-08-02 MED ORDER — TRANEXAMIC ACID 1000 MG/10ML IV SOLN
2000.0000 mg | Freq: Once | INTRAVENOUS | Status: DC
  Filled 2015-08-02: qty 20

## 2015-08-02 MED ORDER — SODIUM CHLORIDE 0.9 % IJ SOLN
INTRAMUSCULAR | Status: DC | PRN
  Administered 2015-08-02: 30 mL

## 2015-08-02 MED ORDER — STERILE WATER FOR IRRIGATION IR SOLN
Status: DC | PRN
  Administered 2015-08-02: 1500 mL

## 2015-08-02 MED ORDER — ACETAMINOPHEN 650 MG RE SUPP: 650.0000 mg | Freq: Four times a day (QID) | RECTAL | Status: DC | PRN

## 2015-08-02 MED ORDER — CEFAZOLIN SODIUM-DEXTROSE 2-3 GM-% IV SOLR
INTRAVENOUS | Status: AC
  Filled 2015-08-02: qty 50

## 2015-08-02 MED ORDER — GABAPENTIN 300 MG PO CAPS
600.0000 mg | ORAL_CAPSULE | Freq: Three times a day (TID) | ORAL | Status: DC
  Administered 2015-08-02 – 2015-08-04 (×6): 600 mg via ORAL
  Filled 2015-08-02 (×9): qty 2

## 2015-08-02 MED ORDER — BISACODYL 10 MG RE SUPP: 10.0000 mg | Freq: Every day | RECTAL | Status: DC | PRN

## 2015-08-02 MED ORDER — BUPIVACAINE HCL 0.25 % IJ SOLN
INTRAMUSCULAR | Status: DC | PRN
  Administered 2015-08-02: 20 mL

## 2015-08-02 MED ORDER — OXYCODONE HCL 5 MG PO TABS: 5.0000 mg | ORAL_TABLET | Freq: Once | ORAL | Status: DC | PRN

## 2015-08-02 MED ORDER — ONDANSETRON HCL 4 MG/2ML IJ SOLN: 4.0000 mg | Freq: Once | INTRAMUSCULAR | Status: DC | PRN

## 2015-08-02 MED ORDER — NITROGLYCERIN 0.4 MG SL SUBL: 0.4000 mg | SUBLINGUAL_TABLET | SUBLINGUAL | Status: DC | PRN

## 2015-08-02 MED ORDER — PROPOFOL 10 MG/ML IV BOLUS
INTRAVENOUS | Status: DC | PRN
  Administered 2015-08-02: 10 mg via INTRAVENOUS

## 2015-08-02 MED ORDER — SODIUM CHLORIDE 0.9 % IV SOLN
INTRAVENOUS | Status: DC
Start: 2015-08-02 — End: 2015-08-02

## 2015-08-02 MED ORDER — LACTATED RINGERS IV SOLN
INTRAVENOUS | Status: DC
  Administered 2015-08-02: 1000 mL via INTRAVENOUS

## 2015-08-02 MED ORDER — BUPIVACAINE HCL (PF) 0.75 % IJ SOLN
INTRAMUSCULAR | Status: DC | PRN
  Administered 2015-08-02: 1.8 mL via INTRATHECAL

## 2015-08-02 MED ORDER — POLYETHYLENE GLYCOL 3350 17 G PO PACK: 17.0000 g | PACK | Freq: Every day | ORAL | Status: DC | PRN

## 2015-08-02 MED ORDER — TRANEXAMIC ACID 1000 MG/10ML IV SOLN
2000.0000 mg | INTRAVENOUS | Status: DC | PRN
  Administered 2015-08-02: 2000 mg via INTRAVENOUS

## 2015-08-02 MED ORDER — ONDANSETRON HCL 4 MG/2ML IJ SOLN
INTRAMUSCULAR | Status: AC
  Filled 2015-08-02: qty 2

## 2015-08-02 MED ORDER — BUPIVACAINE LIPOSOME 1.3 % IJ SUSP
20.0000 mL | Freq: Once | INTRAMUSCULAR | Status: DC
  Filled 2015-08-02: qty 20

## 2015-08-02 MED ORDER — PROPOFOL 10 MG/ML IV BOLUS
INTRAVENOUS | Status: AC
  Filled 2015-08-02: qty 40

## 2015-08-02 MED ORDER — OXYCODONE HCL 5 MG/5ML PO SOLN
5.0000 mg | Freq: Once | ORAL | Status: DC | PRN
  Filled 2015-08-02: qty 5

## 2015-08-02 MED ORDER — SODIUM CHLORIDE 0.9 % IR SOLN
Status: DC | PRN
  Administered 2015-08-02: 1000 mL

## 2015-08-02 MED ORDER — SERTRALINE HCL 50 MG PO TABS
50.0000 mg | ORAL_TABLET | Freq: Every day | ORAL | Status: DC
  Administered 2015-08-03 – 2015-08-04 (×2): 50 mg via ORAL
  Filled 2015-08-02 (×2): qty 1

## 2015-08-02 MED ORDER — ACETAMINOPHEN 10 MG/ML IV SOLN
INTRAVENOUS | Status: AC
  Filled 2015-08-02: qty 100

## 2015-08-02 MED ORDER — OXYCODONE HCL 5 MG PO TABS
5.0000 mg | ORAL_TABLET | ORAL | Status: DC | PRN
  Administered 2015-08-02 – 2015-08-04 (×8): 10 mg via ORAL
  Filled 2015-08-02 (×8): qty 2

## 2015-08-02 MED ORDER — PHENYLEPHRINE HCL 10 MG/ML IJ SOLN
INTRAMUSCULAR | Status: AC
  Filled 2015-08-02: qty 1

## 2015-08-02 MED ORDER — DEXAMETHASONE SODIUM PHOSPHATE 10 MG/ML IJ SOLN
10.0000 mg | Freq: Once | INTRAMUSCULAR | Status: AC
  Administered 2015-08-03: 10 mg via INTRAVENOUS
  Filled 2015-08-02: qty 1

## 2015-08-02 MED ORDER — METOCLOPRAMIDE HCL 10 MG PO TABS: 5.0000 mg | ORAL_TABLET | Freq: Three times a day (TID) | ORAL | Status: DC | PRN

## 2015-08-02 MED ORDER — DOCUSATE SODIUM 100 MG PO CAPS
100.0000 mg | ORAL_CAPSULE | Freq: Two times a day (BID) | ORAL | Status: DC
  Administered 2015-08-02 – 2015-08-04 (×4): 100 mg via ORAL

## 2015-08-02 MED ORDER — BUPIVACAINE LIPOSOME 1.3 % IJ SUSP
INTRAMUSCULAR | Status: DC | PRN
  Administered 2015-08-02: 20 mL

## 2015-08-02 SURGICAL SUPPLY — 65 items
ADAPTER BOLT FEMORAL +2/-2 (Knees) ×2 IMPLANT
AUGMENT FEM SZ4 4 LT DIST PFC (Knees) ×2 IMPLANT
AUGMENT FMRL PST PFC SIG SZ4 8 (Orthopedic Implant) ×2 IMPLANT
AUGMENT POSTERIOR PFC (Knees) ×1 IMPLANT
BAG DECANTER FOR FLEXI CONT (MISCELLANEOUS) ×2 IMPLANT
BANDAGE ACE 6X5 VEL STRL LF (GAUZE/BANDAGES/DRESSINGS) ×2 IMPLANT
BANDAGE ELASTIC 6 VELCRO ST LF (GAUZE/BANDAGES/DRESSINGS) ×2 IMPLANT
BLADE SAG 18X100X1.27 (BLADE) ×2 IMPLANT
BLADE SAW SGTL 11.0X1.19X90.0M (BLADE) ×2 IMPLANT
BONE CEMENT GENTAMICIN (Cement) ×6 IMPLANT
CEMENT BONE GENTAMICIN 40 (Cement) ×3 IMPLANT
CLOTH BEACON ORANGE TIMEOUT ST (SAFETY) ×2 IMPLANT
COMP FEM CEM LT SZ4 (Orthopedic Implant) ×2 IMPLANT
COMPONENT FEM CEM LT SZ4 (Orthopedic Implant) ×1 IMPLANT
COVER SURGICAL LIGHT HANDLE (MISCELLANEOUS) ×2 IMPLANT
CUFF TOURN SGL QUICK 34 (TOURNIQUET CUFF) ×1
CUFF TRNQT CYL 34X4X40X1 (TOURNIQUET CUFF) ×1 IMPLANT
DRAPE U-SHAPE 47X51 STRL (DRAPES) ×2 IMPLANT
DRSG ADAPTIC 3X8 NADH LF (GAUZE/BANDAGES/DRESSINGS) ×2 IMPLANT
DRSG PAD ABDOMINAL 8X10 ST (GAUZE/BANDAGES/DRESSINGS) ×2 IMPLANT
DURAPREP 26ML APPLICATOR (WOUND CARE) ×2 IMPLANT
ELECT REM PT RETURN 9FT ADLT (ELECTROSURGICAL) ×2
ELECTRODE REM PT RTRN 9FT ADLT (ELECTROSURGICAL) ×1 IMPLANT
EVACUATOR 1/8 PVC DRAIN (DRAIN) IMPLANT
FEM POST AUG PFC SIGMA SZ4 8MM (Orthopedic Implant) ×4 IMPLANT
FEMORAL ADAPTER (Orthopedic Implant) ×2 IMPLANT
GAUZE SPONGE 4X4 12PLY STRL (GAUZE/BANDAGES/DRESSINGS) ×2 IMPLANT
GLOVE BIO SURGEON STRL SZ7.5 (GLOVE) IMPLANT
GLOVE BIO SURGEON STRL SZ8 (GLOVE) ×2 IMPLANT
GLOVE BIOGEL PI IND STRL 6.5 (GLOVE) ×2 IMPLANT
GLOVE BIOGEL PI IND STRL 8 (GLOVE) ×1 IMPLANT
GLOVE BIOGEL PI INDICATOR 6.5 (GLOVE) ×2
GLOVE BIOGEL PI INDICATOR 8 (GLOVE) ×1
GLOVE INDICATOR 6.5 STRL GRN (GLOVE) ×4 IMPLANT
GLOVE SURG SS PI 6.5 STRL IVOR (GLOVE) IMPLANT
GOWN STRL REUS W/TWL LRG LVL3 (GOWN DISPOSABLE) ×4 IMPLANT
GOWN STRL REUS W/TWL XL LVL3 (GOWN DISPOSABLE) ×4 IMPLANT
HANDPIECE INTERPULSE COAX TIP (DISPOSABLE) ×1
IMMOBILIZER KNEE 20 (SOFTGOODS) ×4 IMPLANT
IMMOBILIZER KNEE 20 THIGH 36 (SOFTGOODS) ×1 IMPLANT
INSERT TIBIAL FIXD BEARING SZ3 (Orthopedic Implant) ×1 IMPLANT
MANIFOLD NEPTUNE II (INSTRUMENTS) ×2 IMPLANT
NS IRRIG 1000ML POUR BTL (IV SOLUTION) ×2 IMPLANT
PACK TOTAL KNEE CUSTOM (KITS) ×2 IMPLANT
PADDING CAST COTTON 6X4 STRL (CAST SUPPLIES) ×2 IMPLANT
POSITIONER SURGICAL ARM (MISCELLANEOUS) ×2 IMPLANT
POSTIER AUGMENT PFC (Knees) ×2 IMPLANT
SET HNDPC FAN SPRY TIP SCT (DISPOSABLE) ×1 IMPLANT
SLEEVE UNIV FRM 40MM (Knees) ×2 IMPLANT
STAPLER VISISTAT 35W (STAPLE) ×2 IMPLANT
STEM UNIVERSAL REVISION 75X18 (Stem) ×2 IMPLANT
STRIP CLOSURE SKIN 1/2X4 (GAUZE/BANDAGES/DRESSINGS) ×2 IMPLANT
SUT PDS AB 1 CT1 27 (SUTURE) ×2 IMPLANT
SUT VIC AB 2-0 CT1 27 (SUTURE) ×3
SUT VIC AB 2-0 CT1 TAPERPNT 27 (SUTURE) ×3 IMPLANT
SUT VLOC 180 0 24IN GS25 (SUTURE) ×2 IMPLANT
SWAB COLLECTION DEVICE MRSA (MISCELLANEOUS) IMPLANT
SYR 50ML LL SCALE MARK (SYRINGE) ×4 IMPLANT
TIBIAL INSERT FIXD BEARING SZ3 (Orthopedic Implant) ×2 IMPLANT
TOWER CARTRIDGE SMART MIX (DISPOSABLE) ×2 IMPLANT
TRAY FOLEY W/METER SILVER 14FR (SET/KITS/TRAYS/PACK) ×2 IMPLANT
TUBE KAMVAC SUCTION (TUBING) IMPLANT
WATER STERILE IRR 1500ML POUR (IV SOLUTION) IMPLANT
WATER STERILE IRR 500ML POUR (IV SOLUTION) ×6 IMPLANT
WRAP KNEE MAXI GEL POST OP (GAUZE/BANDAGES/DRESSINGS) ×2 IMPLANT

## 2015-08-02 NOTE — Anesthesia Procedure Notes (Addendum)
Spinal Patient location during procedure: OR Start time: 08/02/2015 3:05 PM End time: 08/02/2015 3:14 PM Staffing Resident/CRNA: Enrigue Catena E Performed by: resident/CRNA  Preanesthetic Checklist Completed: patient identified, site marked, surgical consent, pre-op evaluation, timeout performed, IV checked, risks and benefits discussed and monitors and equipment checked Spinal Block Patient position: sitting Prep: Betadine Patient monitoring: heart rate, continuous pulse ox and blood pressure Location: L3-4 Injection technique: single-shot Needle Needle type: Quincke  Needle gauge: 22 G Needle length: 9 cm Additional Notes Expiration date of kit checked and confirmed. Patient tolerated procedure well, without complications.

## 2015-08-02 NOTE — Anesthesia Postprocedure Evaluation (Addendum)
Anesthesia Post Note  Patient: [name]  Procedure(s) Performed: Procedure(s) (LRB): LEFT FEMORAL REVISION (Left)  Patient location during evaluation: PACU Anesthesia Type: Spinal Level of consciousness: awake and alert Pain management: pain level controlled Vital Signs Assessment: post-procedure vital signs reviewed and stable Respiratory status: spontaneous breathing, nonlabored ventilation, respiratory function stable and patient connected to nasal cannula oxygen Cardiovascular status: blood pressure returned to baseline and stable Postop Assessment: no signs of nausea or vomiting and spinal receding Anesthetic complications: no    Last Vitals:  Filed Vitals:   08/02/15 1818 08/02/15 1934  BP: 143/90 142/86  Pulse: 93 87  Temp: 36.4 C 36.5 C  Resp: 16 16    Last Pain:  Filed Vitals:   08/02/15 1935  PainSc: 8                  Zenaida Deed

## 2015-08-02 NOTE — Op Note (Signed)
NAMEGEANNIE, [name] NO.:  [id number]  MEDICAL RECORD NO.:  [id number]  LOCATION:  [id number]                         FACILITY:  [facility]  PHYSICIAN:  Gaynelle Arabian, M.D.    DATE OF BIRTH:  September 12, 1929  DATE OF PROCEDURE:  08/02/2015 DATE OF DISCHARGE:                              OPERATIVE REPORT   PREOPERATIVE DIAGNOSIS:  Failed left total knee arthroplasty.  POSTOPERATIVE DIAGNOSIS:  Failed left total knee arthroplasty.  PROCEDURE:  Left femoral revision of total knee arthroplasty.  SURGEON:  Gaynelle Arabian, M.D.  ASSISTANT:  Alexzandrew L. Perkins, PA-C.  ANESTHESIA:  Spinal.  ESTIMATED BLOOD LOSS:  Minimal.  DRAINS:  Hemovac x1.  COMPLICATIONS:  None.  TOURNIQUET TIME:  55 minutes at 300 mmHg.  CONDITION:  Stable to recovery.  BRIEF CLINICAL NOTE:  Ms. Banales is an 82 year old female, complex history regard to her left knee.  She had a primary total knee and then I ended up doing a tibial revision with a long cemented stem about 15 years ago. She has done very well with that, but then recently developed significant knee pain again.  X-ray show loosening of her primary femoral component.  It was grossly loose and she was in significant varus.  She presents now for femoral versus total knee revision.  PROCEDURE IN DETAIL:  After successful administration of spinal anesthetic, a tourniquet was placed high on her left thigh and her left lower extremities prepped and draped in the usual sterile fashion. Extremities wrapped in Esmarch, and tourniquet inflated to 300 mmHg.  A midline incision was made with a 10 blade through subcutaneous tissue to the extensor mechanism.  A fresh blade was used to make a medial parapatellar arthrotomy.  We did not encounter any fluid in the joint. Soft tissue on the proximal medial tibia subperiosteally elevated to the joint line with a knife and the semimembranosus bursa with a Cobb elevator.  Soft tissue laterally was  elevated with attention being paid to avoid patellar tendon on tibial tubercle.  Patella was subluxed laterally and the tibia subluxed forward.  I then removed the tibial insert from the tibial tray.  This was a 15 mm stabilized plus insert for a size 3 fixed bearing Sigma revision tray.  I inspected the tray and no signs of obvious loosening.  I attempted multiple times to see if there was any disruption of the interface between the tray cemented bone and this was a very solidly fixed tibial component.  I decided to try and leave it intact if we can get a stable construct for the entire knee.  We addressed the femur.  I was able to remove the femur with my fingers and it was grossly loose.  It had gone into significant varus and migrated proximally.  There was a defect of distal and anterior femoral bone.  All of the associated fibrinous soft tissue was removed from the femur to get down to bone.  Fortunately is a contained defect in the condyles and epicondyles are still intact.  We then gained access to the femoral canal and thoroughly irrigated.  I reamed up to 18 mm.  She had an  excellent press-fit.  We placed the distal femoral alignment guide on the 18 reamer and placed this in 5 degrees of valgus.  I resected about 2 mm off the distal lateral femur.  I felt that was going to have to build out distally given the distal bone loss.  I thus decided a 4 mm augment lateral and 8 mm augment medial in order to get down to decent bone.  Given the defect, I also decided to do a femoral sleeve.  We broached slowly up to size 40 femoral sleeve, which had a fantastic rotational and torsional fit.  The alignment rod was attached to the sleeve and then we placed the size 4 AP cutting block.  It is placed in rotation to match the epicondylar axis.  Minimal bone was taken anteriorly.  No bone was taken posteriorly or through the chamfers, in fact we needed 8 mm augments medial and lateral  posteriorly in order to get any bone.  We then placed a femoral trial, which was a size 4 TC3 femur with 18 x 75 stem extension.  A 40 sleeve set and a small amount of external rotation with an 8 mm distal medial augment, 4 mm distal lateral augment, 8 mm posterior augments medial and lateral.  The trial was impacted with excellent fit and excellent stability.  I placed a 15 mm trial insert and it was a little bit of __________ 17.5 and full extension was achieved with excellent varus-valgus anterior-posterior balance throughout full range of motion.  I was very happy with this and because of the excellent stability, decided we would not need to revise the tibia.  We then removed the trial and inspected the patella. Patella surprisingly had no wear.  We did a patella plasty in order to remove all the soft tissue from the surface of the patella, and again there was no wear in the patellar component.  The components were then assembled on the back table.  Once again size 4 TC3 femur with a 40 mm sleeve set in 3 degrees of external rotation with an 18 x 75 stem extension, everything in a +2 position 5 degrees of valgus and then the augments were 8 mm posterior augments medial and lateral, 8 mm medial distal augment and 4 mm distal lateral augment. The components assembled on the back table.  Once assembled, then we thoroughly irrigated the bone with pulsatile lavage and mixed the cement.  Once cement was ready, we cemented the femur distally.  The sleeve was porous coated, so we then cemented the sleeve for the stem, as we got an excellent press-fit on the stem also.  We then placed a 17.5 mm trial insert, knee held in full extension.  All extruded cement removed.  Once the cement was fully hardened, any permanent fixed bearing size 3, 17.5 mm thickness posterior stabilized insert was placed in tibial tray.  Knee was reduced with excellent stability.  Varus, valgus and anterior-posterior  throughout full range of motion.  Wound was copiously irrigated with saline solution.  The Exparel 20 mL mixed with 40 mL of saline injected into the extensor mechanism, periosteum of the femur and the subcu tissues.  Additional 20 mL of core percent Marcaine injected into the same tissues.  Arthrotomy was then closed over Hemovac drain with a running #1 V-Loc suture and reinforced with interrupted #1 PDS.  Flexion against gravity to about 100 degrees.  The tourniquet was then released, total time of 55 minutes.  Minor  bleeding stopped with cautery.  Additional drain was placed in the subcu space and subcu closed with interrupted 2-0 Vicryl.  Skin was closed with staples.  Incisions cleaned and dried and a bulky sterile dressing applied.  We had mixed 2 g of topical TXA with 30 mL of saline and injected that into the joint prior to the tourniquet down.  When fully closed, the incisions cleaned and dried.  A bulky sterile dressing applied.  Drains hooked to suction.  She was placed into a knee immobilizer, awakened, and transported to recovery in stable condition.  Note that a surgical assistant was a medical necessity for this very complex procedure.  Assistant was necessary for proper retraction of vital ligaments and neurovascular structures, and appropriate positioning of the limb for removal of the old implant and for proper and accurate positioning of the new implant.     Gaynelle Arabian, M.D.     FA/MEDQ  D:  08/02/2015  T:  08/02/2015  Job:  [id number]

## 2015-08-02 NOTE — Interval H&P Note (Signed)
History and Physical Interval Note:  08/02/2015 2:58 PM  [name]  has presented today for surgery, with the diagnosis of loose left TKA  The various methods of treatment have been discussed with the patient and family. After consideration of risks, benefits and other options for treatment, the patient has consented to  Procedure(s): LEFT TOTAL KNEE REVISION (Left) as a surgical intervention .  The patient's history has been reviewed, patient examined, no change in status, stable for surgery.  I have reviewed the patient's chart and labs.  Questions were answered to the patient's satisfaction.     Gearlean Alf

## 2015-08-02 NOTE — Anesthesia Preprocedure Evaluation (Addendum)
Anesthesia Evaluation  Patient identified by MRN, date of birth, ID band Patient awake    Reviewed: Allergy & Precautions, NPO status , Patient's Chart, lab work & pertinent test results, reviewed documented beta blocker date and time   History of Anesthesia Complications (+) PONV and history of anesthetic complications  Airway Mallampati: II  TM Distance: >3 FB Neck ROM: Full    Dental  (+) Dental Advisory Given, Edentulous Upper, Edentulous Lower   Pulmonary shortness of breath, COPD, former smoker,    Pulmonary exam normal breath sounds clear to auscultation       Cardiovascular hypertension, Pt. on medications and Pt. on home beta blockers + CAD, + Cardiac Stents and +CHF  Normal cardiovascular exam+ dysrhythmias  Rhythm:Irregular Rate:Normal     Neuro/Psych  Headaches, PSYCHIATRIC DISORDERS Anxiety Depression CVA    GI/Hepatic negative GI ROS, Neg liver ROS, hiatal hernia, GERD  Medicated and Controlled,  Endo/Other  negative endocrine ROS  Renal/GU Renal InsufficiencyRenal disease     Musculoskeletal  (+) Arthritis ,   Abdominal   Peds  Hematology  (+) anemia ,   Anesthesia Other Findings No anticoagulants, coags normal, previous back surgery but she reports no screws, rods or fusion performed, she is aware of risks of infection, failure, bleeding and accepts all risks for spinal attempt  Reproductive/Obstetrics negative OB ROS                           Anesthesia Physical  Anesthesia Plan  ASA: III  Anesthesia Plan: Spinal   Post-op Pain Management:    Induction: Intravenous  Airway Management Planned: Simple Face Mask  Additional Equipment:   Intra-op Plan:   Post-operative Plan:   Informed Consent: I have reviewed the patients History and Physical, chart, labs and discussed the procedure including the risks, benefits and alternatives for the proposed anesthesia with  the patient or authorized representative who has indicated his/her understanding and acceptance.   Dental advisory given  Plan Discussed with: CRNA  Anesthesia Plan Comments: (Low lumbar spinal with phenylephrine infusion during case, propofol to avoid hemodynamic shifts of general anesthesia and for optimal intraop analgesia)      Anesthesia Quick Evaluation

## 2015-08-02 NOTE — Transfer of Care (Signed)
Immediate Anesthesia Transfer of Care Note  Patient: [name]  Procedure(s) Performed: Procedure(s): LEFT FEMORAL REVISION (Left)  Patient Location: PACU  Anesthesia Type:Spinal  Level of Consciousness: awake, alert , oriented and patient cooperative  Airway & Oxygen Therapy: Patient Spontanous Breathing and Patient connected to face mask oxygen  Post-op Assessment: Report given to RN and Post -op Vital signs reviewed and stable  Post vital signs: stable  Last Vitals:  Filed Vitals:   08/02/15 1256  BP: 124/69  Pulse: 92  Temp: 36.4 C  Resp: 16    Complications: No apparent anesthesia complications   L1 spinal level

## 2015-08-02 NOTE — Brief Op Note (Signed)
08/02/2015  4:46 PM  PATIENT:  [name]  82 y.o. female  PRE-OPERATIVE DIAGNOSIS:  loose left TKA  POST-OPERATIVE DIAGNOSIS:  loose left TKA  PROCEDURE:  Procedure(s): LEFT FEMORAL REVISION (Left)  SURGEON:  Surgeon(s) and Role:    * Gaynelle Arabian, MD - Primary  PHYSICIAN ASSISTANT:   ASSISTANTS: Arlee Muslim, PA-C   ANESTHESIA: Spinal  EBL:  Total I/O In: 1000 [I.V.:1000] Out: 550 [Urine:350; Blood:200]  BLOOD ADMINISTERED:none  DRAINS: (Medium) Hemovact drain(s) in the left knee with  Suction Open   LOCAL MEDICATIONS USED:  OTHER Exparel  COUNTS:  YES  TOURNIQUET:   Total Tourniquet Time Documented: Thigh (Left) - 55 minutes Total: Thigh (Left) - 55 minutes   DICTATION: .Other Dictation: Dictation Number [id number]  PLAN OF CARE: Admit to inpatient   PATIENT DISPOSITION:  PACU - hemodynamically stable.

## 2015-08-03 ENCOUNTER — Encounter (HOSPITAL_COMMUNITY): Payer: Self-pay | Admitting: *Deleted

## 2015-08-03 LAB — BASIC METABOLIC PANEL
Anion gap: 10 (ref 5–15)
BUN: 45 mg/dL — ABNORMAL HIGH (ref 6–20)
CO2: 24 mmol/L (ref 22–32)
Calcium: 8.8 mg/dL — ABNORMAL LOW (ref 8.9–10.3)
Chloride: 104 mmol/L (ref 101–111)
Creatinine, Ser: 1.34 mg/dL — ABNORMAL HIGH (ref 0.44–1.00)
GFR calc Af Amer: 41 mL/min — ABNORMAL LOW (ref >60)
GFR calc non Af Amer: 36 mL/min — ABNORMAL LOW (ref >60)
Glucose, Bld: 169 mg/dL — ABNORMAL HIGH (ref 65–99)
Potassium: 5.2 mmol/L — ABNORMAL HIGH (ref 3.5–5.1)
Sodium: 138 mmol/L (ref 135–145)

## 2015-08-03 LAB — CBC
HCT: 35.2 % — ABNORMAL LOW (ref 36.0–46.0)
Hemoglobin: 11.1 g/dL — ABNORMAL LOW (ref 12.0–15.0)
MCH: 29.5 pg (ref 26.0–34.0)
MCHC: 31.5 g/dL (ref 30.0–36.0)
MCV: 93.6 fL (ref 78.0–100.0)
Platelets: 172 10*3/uL (ref 150–400)
RBC: 3.76 MIL/uL — ABNORMAL LOW (ref 3.87–5.11)
RDW: 14.3 % (ref 11.5–15.5)
WBC: 10 10*3/uL (ref 4.0–10.5)

## 2015-08-03 MED ORDER — ESOMEPRAZOLE MAGNESIUM 20 MG PO CPDR
20.0000 mg | DELAYED_RELEASE_CAPSULE | Freq: Every day | ORAL | Status: DC
  Administered 2015-08-03 – 2015-08-04 (×2): 20 mg via ORAL
  Filled 2015-08-03 (×2): qty 1

## 2015-08-03 NOTE — Care Management Note (Addendum)
Case Management Note  Patient Details  Name: [name] MRN: [id number] Date of Birth: Jan 05, 1930  Subjective/Objective:                  LEFT FEMORAL REVISION (Left) Action/Plan: Discharge planning Expected Discharge Date:  unknown               Expected Discharge Plan:  [place]  In-House Referral:     Discharge planning Services  CM Consult  Post Acute Care Choice:    Choice offered to:     DME Arranged:    DME Agency:     HH Arranged:    [place] Agency:     Status of Service:  Completed, signed off  Medicare Important Message Given:    Date Medicare IM Given:    Medicare IM give by:    Date Additional Medicare IM Given:    Additional Medicare Important Message give by:     If discussed at [place] of Stay Meetings, dates discussed:    Additional Comments: CM notes pt to go to SNF for rehab; CSW arranging.  No other CM needs were communicated. Dellie Catholic, RN 08/03/2015, 1:12 PM

## 2015-08-03 NOTE — Discharge Instructions (Addendum)
° °Dr. Frank Aluisio °Total Joint Specialist °[facility] °[address] °[place], [place] [address] °[phone] ° °TOTAL KNEE REPLACEMENT POSTOPERATIVE DIRECTIONS ° °Knee Rehabilitation, Guidelines Following Surgery  °Results after knee surgery are often greatly improved when you follow the exercise, range of motion and muscle strengthening exercises prescribed by your doctor. Safety measures are also important to protect the knee from further injury. Any time any of these exercises cause you to have increased pain or swelling in your knee joint, decrease the amount until you are comfortable again and slowly increase them. If you have problems or questions, call your caregiver or physical therapist for advice.  ° °HOME CARE INSTRUCTIONS  °Remove items at home which could result in a fall. This includes throw rugs or furniture in walking pathways.  °· ICE to the affected knee every three hours for 30 minutes at a time and then as needed for pain and swelling.  Continue to use ice on the knee for pain and swelling from surgery. You [place] notice swelling that will progress down to the foot and ankle.  This is normal after surgery.  Elevate the leg when you are not up walking on it.   °· Continue to use the breathing machine which will help keep your temperature down.  It is common for your temperature to cycle up and down following surgery, especially at night when you are not up moving around and exerting yourself.  The breathing machine keeps your lungs expanded and your temperature down. °· Do not place pillow under knee, focus on keeping the knee straight while resting ° °DIET °You Heinkel resume your previous home diet once your are discharged from the hospital. ° °DRESSING / WOUND CARE / SHOWERING °You Kenedy shower 3 days after surgery, but keep the wounds dry during showering.  You Anglemyer use an occlusive plastic wrap (Press'n Seal for example), NO SOAKING/SUBMERGING IN THE BATHTUB.  If the  bandage gets wet, change with a clean dry gauze.  If the incision gets wet, pat the wound dry with a clean towel. °You Arteaga start showering once you are discharged home but do not submerge the incision under water. Just pat the incision dry and apply a dry gauze dressing on daily. °Change the surgical dressing daily and reapply a dry dressing each time. ° °ACTIVITY °Walk with your walker as instructed. °Use walker as long as suggested by your caregivers. °Avoid periods of inactivity such as sitting longer than an hour when not asleep. This helps prevent blood clots.  °You Flewellen resume a sexual relationship in one month or when given the OK by your doctor.  °You Demarco return to work once you are cleared by your doctor.  °Do not drive a car for 6 weeks or until released by you surgeon.  °Do not drive while taking narcotics. ° °WEIGHT BEARING °Weight bearing as tolerated with assist device (walker, cane, etc) as directed, use it as long as suggested by your surgeon or therapist, typically at least 4-6 weeks. ° °POSTOPERATIVE CONSTIPATION PROTOCOL °Constipation - defined medically as fewer than three stools per week and severe constipation as less than one stool per week. ° °One of the most common issues patients have following surgery is constipation.  Even if you have a regular bowel pattern at home, your normal regimen is likely to be disrupted due to multiple reasons following surgery.  Combination of anesthesia, postoperative narcotics, change in appetite and fluid intake all can affect your bowels.    In order to avoid complications following surgery, here are some recommendations in order to help you during your recovery period. ° °Colace (docusate) - Pick up an over-the-counter form of Colace or another stool softener and take twice a day as long as you are requiring postoperative pain medications.  Take with a full glass of water daily.  If you experience loose stools or diarrhea, hold the colace until you stool forms  back up.  If your symptoms do not get better within 1 week or if they get worse, check with your doctor. ° °Dulcolax (bisacodyl) - Pick up over-the-counter and take as directed by the product packaging as needed to assist with the movement of your bowels.  Take with a full glass of water.  Use this product as needed if not relieved by Colace only.  ° °MiraLax (polyethylene glycol) - Pick up over-the-counter to have on hand.  MiraLax is a solution that will increase the amount of water in your bowels to assist with bowel movements.  Take as directed and can mix with a glass of water, juice, soda, coffee, or tea.  Take if you go more than two days without a movement. °Do not use MiraLax more than once per day. Call your doctor if you are still constipated or irregular after using this medication for 7 days in a row. ° °If you continue to have problems with postoperative constipation, please contact the office for further assistance and recommendations.  If you experience "the worst abdominal pain ever" or develop nausea or vomiting, please contact the office immediatly for further recommendations for treatment. ° °ITCHING ° If you experience itching with your medications, try taking only a single pain pill, or even half a pain pill at a time.  You can also use Benadryl over the counter for itching or also to help with sleep.  ° °TED HOSE STOCKINGS °Wear the elastic stockings on both legs for three weeks following surgery during the day but you Barretta remove then at night for sleeping. ° °MEDICATIONS °See your medication summary on the “After Visit Summary” that the nursing staff will review with you prior to discharge.  You Eisler have some home medications which will be placed on hold until you complete the course of blood thinner medication.  It is important for you to complete the blood thinner medication as prescribed by your surgeon.  Continue your approved medications as instructed at time of  discharge. ° °PRECAUTIONS °If you experience chest pain or shortness of breath - call 911 immediately for transfer to the hospital emergency department.  °If you develop a fever greater that 101 F, purulent drainage from wound, increased redness or drainage from wound, foul odor from the wound/dressing, or calf pain - CONTACT YOUR SURGEON.   °                                                °FOLLOW-UP APPOINTMENTS °Make sure you keep all of your appointments after your operation with your surgeon and caregivers. You should call the office at the above phone number and make an appointment for approximately two weeks after the date of your surgery or on the date instructed by your surgeon outlined in the "After Visit Summary". ° ° °RANGE OF MOTION AND STRENGTHENING EXERCISES  °Rehabilitation of the knee is important following a knee injury or   an operation. After just a few days of immobilization, the muscles of the thigh which control the knee become weakened and shrink (atrophy). Knee exercises are designed to build up the tone and strength of the thigh muscles and to improve knee motion. Often times heat used for twenty to thirty minutes before working out will loosen up your tissues and help with improving the range of motion but do not use heat for the first two weeks following surgery. These exercises can be done on a training (exercise) mat, on the floor, on a table or on a bed. Use what ever works the best and is most comfortable for you Knee exercises include:  Leg Lifts - While your knee is still immobilized in a splint or cast, you can do straight leg raises. Lift the leg to 60 degrees, hold for 3 sec, and slowly lower the leg. Repeat 10-20 times 2-3 times daily. Perform this exercise against resistance later as your knee gets better.  Quad and Hamstring Sets - Tighten up the muscle on the front of the thigh (Quad) and hold for 5-10 sec. Repeat this 10-20 times hourly. Hamstring sets are done by pushing the  foot backward against an object and holding for 5-10 sec. Repeat as with quad sets.   Leg Slides: Lying on your back, slowly slide your foot toward your buttocks, bending your knee up off the floor (only go as far as is comfortable). Then slowly slide your foot back down until your leg is flat on the floor again.  Angel Wings: Lying on your back spread your legs to the side as far apart as you can without causing discomfort.  A rehabilitation program following serious knee injuries can speed recovery and prevent re-injury in the future due to weakened muscles. Contact your doctor or a physical therapist for more information on knee rehabilitation.   IF YOU ARE TRANSFERRED TO A SKILLED REHAB FACILITY If the patient is transferred to a skilled rehab facility following release from the hospital, a list of the current medications will be sent to the facility for the patient to continue.  When discharged from the skilled rehab facility, please have the facility set up the patient's [place] prior to being released. Also, the skilled facility will be responsible for providing the patient with their medications at time of release from the facility to include their pain medication, the muscle relaxants, and their blood thinner medication. If the patient is still at the rehab facility at time of the two week follow up appointment, the skilled rehab facility will also need to assist the patient in arranging follow up appointment in our office and any transportation needs.  MAKE SURE YOU:  Understand these instructions.  Get help right away if you are not doing well or get worse.    Pick up stool softner and laxative for home use following surgery while on pain medications. Do not submerge incision under water. Please use good hand washing techniques while changing dressing each day. Varone shower starting three days after surgery. Please use a clean towel to pat the incision dry following  showers. Continue to use ice for pain and swelling after surgery. Do not use any lotions or creams on the incision until instructed by your surgeon.  Take Eliquis twice a day for two and a half more weeks, then discontinue Eliquis. Once the patient has completed the Xarelto, they Babiarz resume the 325 mg Aspirin.    Information on my medicine -  ELIQUIS (apixaban)  This medication education was reviewed with me or my healthcare representative as part of my discharge preparation.  The pharmacist that spoke with me during my hospital stay was:  Angela Adam [facility]  Why was Eliquis prescribed for you? Eliquis was prescribed for you to reduce the risk of blood clots forming after orthopedic surgery.    What do You need to know about Eliquis? Take your Eliquis TWICE DAILY - one tablet in the morning and one tablet in the evening with or without food.  It would be best to take the dose about the same time each day.  If you have difficulty swallowing the tablet whole please discuss with your pharmacist how to take the medication safely.  Take Eliquis exactly as prescribed by your doctor and DO NOT stop taking Eliquis without talking to the doctor who prescribed the medication.  Stopping without other medication to take the place of Eliquis Seevers increase your risk of developing a clot.  After discharge, you should have regular check-up appointments with your healthcare provider that is prescribing your Eliquis.  What do you do if you miss a dose? If a dose of ELIQUIS is not taken at the scheduled time, take it as soon as possible on the same day and twice-daily administration should be resumed.  The dose should not be doubled to make up for a missed dose.  Do not take more than one tablet of ELIQUIS at the same time.  Important Safety Information A possible side effect of Eliquis is bleeding. You should call your healthcare provider right away if you experience any of the  following: ? Bleeding from an injury or your nose that does not stop. ? Unusual colored urine (red or dark brown) or unusual colored stools (red or black). ? Unusual bruising for unknown reasons. ? A serious fall or if you hit your head (even if there is no bleeding).  Some medicines [place]rak interact with Eliquis and might increase your risk of bleeding or clotting while on Eliquis. To help avoid this, consult your healthcare provider or pharmacist prior to using any new prescription or non-prescription medications, including herbals, vitamins, non-steroidal anti-inflammatory drugs (NSAIDs) and supplements.  This website has more information on Eliquis (apixaban): http://www.eliquis.com/eliquis/home

## 2015-08-03 NOTE — Progress Notes (Signed)
Subjective: 1 Day Post-Op Procedure(s) (LRB): LEFT FEMORAL REVISION (Left) Patient reports pain as mild and moderate.   Patient seen in rounds by Dr. Wynelle Link. Patient is well, but has had some minor complaints of pain in the knee, requiring pain medications We will start therapy today.  Plan is to go Skilled nursing facility after hospital stay.  Will get social worker to assist with placement.  Objective: Vital signs in last 24 hours: Temp:  [97.4 F (36.3 C)-98 F (36.7 C)] 97.4 F (36.3 C) (01/19 0512) Pulse Rate:  [64-104] 72 (01/19 0512) Resp:  [16-20] 16 (01/19 0512) BP: (113-143)/(69-93) 119/72 mmHg (01/19 0512) SpO2:  [97 %-100 %] 98 % (01/19 0512) Weight:  [81.194 kg (179 lb)] 81.194 kg (179 lb) (01/18 1333)  Intake/Output from previous day:  Intake/Output Summary (Last 24 hours) at 08/03/15 0742 Last data filed at 08/03/15 0509  Gross per 24 hour  Intake   2520 ml  Output   1880 ml  Net    640 ml    Intake/Output this shift: UOP 1000 since MN  Labs:  Recent Labs  08/03/15 0430  HGB 11.1*    Recent Labs  08/03/15 0430  WBC 10.0  RBC 3.76*  HCT 35.2*  PLT 172    Recent Labs  08/03/15 0430  NA 138  K 5.2*  CL 104  CO2 24  BUN 45*  CREATININE 1.34*  GLUCOSE 169*  CALCIUM 8.8*   No results for input(s): LABPT, INR in the last 72 hours.  EXAM General - Patient is Alert and Appropriate Extremity - Neurovascular intact Sensation intact distally Dressing - dressing C/D/I Motor Function - intact, moving foot and toes well on exam.  Hemovac pulled without difficulty.  Past Medical History  Diagnosis Date  . CAD (coronary artery disease)     a. LHC 4/09: pLAD 40, mDx 70-75, pLCx 30, mLCx 50, mRCA 90, EF 60% >> PCI: BMS x2 to RCA;  b. Myoview 8/12: normal  . Essential hypertension   . Chronic diastolic CHF (congestive heart failure) (HCC)     a. Echo 912 - Mild LVH, EF 55-60%, no RWMA, Gr 1 DD, mild MR, mild LAE, mild RAE, PASP 59 mmHg  (mod to severe pulmo HTN)  . History of TIA (transient ischemic attack)     a. Head CT 6/15: small chronic lacunar infarct in thalamus  . Hyperlipidemia   . Depression   . COPD with chronic bronchitis ([place])   . GERD (gastroesophageal reflux disease)   . History of hiatal hernia   . History of DVT of lower extremity   . Sigmoid diverticulosis     s/p sigmoid colectomy  . Colovaginal fistula     s/p colostomy >> colostomy takedown 5/16  . History of adenomatous polyp of colon   . Renal insufficiency   . OA (osteoarthritis)   . Wears glasses   . Wears dentures   . Colostomy in place [facility])     s/p colostomy takedown 5/16  . Peripheral neuropathy ([place])   . Chronic anemia   . History of blood transfusion   . Allergy   . Anxiety   . Cataract     bil cateracts removed  . Pulmonary HTN ([place])     a. PASP on Echo in 9/12:  59 mmHg  . Dysrhythmia     A fib  . Stroke ([place])     TIAs  . Pneumonia   . Adenocarcinoma, colon [facility]) Oncologist-- Dr Krista Blue  Feng    Multifocal (2) Colon cancer @ ileocecal valve and ascending ( [id number]), Stage IIIB, grade I, MMR normal , 2 of 16 +lymph nodes, negative surgical margins---  06-02-2014  Right hemicolectomy w/ colostomy    Assessment/Plan: 1 Day Post-Op Procedure(s) (LRB): LEFT FEMORAL REVISION (Left) Principal Problem:   Failed total left knee replacement (HCC) Active Problems:   Failed total knee, left (HCC)  Estimated body mass index is 31.21 kg/(m^2) as calculated from the following:   Height as of this encounter: 5' 3.5" (1.613 m).   Weight as of this encounter: 81.194 kg (179 lb). Advance diet Up with therapy Discharge to SNF when able  DVT Prophylaxis - Eliquis Weight-Bearing as tolerated to left leg D/C O2 and Pulse OX and try on Room Air  Arlee Muslim, PA-C Orthopaedic Surgery 08/03/2015, 7:42 AM

## 2015-08-03 NOTE — Evaluation (Signed)
Physical Therapy Evaluation Patient Details Name: [name] MRN: [id number] DOB: 01/16/1930 Today's Date: 08/03/2015   History of Present Illness  s/p LEFT FEMORAL REVISION (Left); PMH includes prior surgical repairs to both knees, CAD, CHF, COPD, TIA, Colovaginal fistula,Adenocarcinoma (colon)   Clinical Impression  Pt s/p L TKR revision presents with decreased L LE strength/ROM and post op pain limiting functional mobility.  Pt would benefit from follow up SNF level rehab to maximize IND and safety prior to return home ALONE.    Follow Up Recommendations SNF    Equipment Recommendations  None recommended by PT    Recommendations for Other Services OT consult     Precautions / Restrictions Precautions Precautions: Knee Required Braces or Orthoses: Knee Immobilizer - Left Knee Immobilizer - Left: Discontinue once straight leg raise with < 10 degree lag Restrictions Weight Bearing Restrictions: No Other Position/Activity Restrictions: WBAT LLE      Mobility  Bed Mobility Overal bed mobility: Needs Assistance Bed Mobility: Supine to Sit     Supine to sit: Min assist;Mod assist     General bed mobility comments: in recliner  Transfers Overall transfer level: Needs assistance Equipment used: Rolling walker (2 wheeled) Transfers: Sit to/from Stand Sit to Stand: Mod assist         General transfer comment: from recliner and 3n1 over toilet. Assist during powerup to standing and to control descent into sitting.  Ambulation/Gait Ambulation/Gait assistance: Min assist;Mod assist Ambulation Distance (Feet): 35 Feet Assistive device: Rolling walker (2 wheeled) Gait Pattern/deviations: Step-to pattern;Decreased step length - right;Decreased step length - left;Shuffle;Trunk flexed Gait velocity: decr   General Gait Details: cues for posture, position from RW, sequence and saftey with RW  Stairs            Wheelchair Mobility    Modified Rankin (Stroke  Patients Only)       Balance Overall balance assessment: Needs assistance Sitting-balance support: No upper extremity supported;Feet supported Sitting balance-Leahy Scale: Good     Standing balance support: Bilateral upper extremity supported;During functional activity Standing balance-Leahy Scale: Poor Standing balance comment: external support needed for balance                             Pertinent Vitals/Pain Pain Assessment: 0-10 Pain Score: 8  Pain Location: L knee Pain Descriptors / Indicators: Aching;Sore Pain Intervention(s): Monitored during session;Repositioned;Utilized relaxation techniques;Ice applied    Home Living Family/patient expects to be discharged to:: Skilled nursing facility Living Arrangements: Alone               Additional Comments: Pt lives at home alone     Prior Function Level of Independence: Independent with assistive device(s)         Comments: ambulates with rollator     Hand Dominance   Dominant Hand: Right    Extremity/Trunk Assessment   Upper Extremity Assessment: Overall WFL for tasks assessed           Lower Extremity Assessment: Defer to PT evaluation   LLE Deficits / Details: 2+/5 quads with AAROM at knee -12 - 50  Cervical / Trunk Assessment: Kyphotic  Communication   Communication: No difficulties  Cognition Arousal/Alertness: Awake/alert Behavior During Therapy: WFL for tasks assessed/performed Overall Cognitive Status: Within Functional Limits for tasks assessed                      General Comments General comments (skin integrity,  edema, etc.): Pt's oxygen noted to drop to 85-87 at times on RA, noted during sit<>stand. Recovered quickly to above 90 with deep breaths.     Exercises Total Joint Exercises Ankle Circles/Pumps: AROM;15 reps;Supine;Both Quad Sets: AROM;Both;10 reps;Supine Heel Slides: AAROM;Left;10 reps;Supine Hip ABduction/ADduction: AAROM;Left;10 reps;Supine       Assessment/Plan    PT Assessment Patient needs continued PT services  PT Diagnosis Difficulty walking   PT Problem List Decreased strength;Decreased range of motion;Decreased activity tolerance;Decreased mobility;Decreased knowledge of use of DME;Pain  PT Treatment Interventions DME instruction;Gait training;Functional mobility training;Therapeutic activities;Therapeutic exercise;Patient/family education   PT Goals (Current goals can be found in the Care Plan section) Acute Rehab PT Goals Patient Stated Goal: rehab then home PT Goal Formulation: With patient Time For Goal Achievement: 08/09/15 Potential to Achieve Goals: Good    Frequency 7X/week   Barriers to discharge        Co-evaluation               End of Session Equipment Utilized During Treatment: Gait belt;Left knee immobilizer Activity Tolerance: Patient tolerated treatment well;Patient limited by pain Patient left: in chair;with call bell/phone within reach Nurse Communication: Mobility status         Time: 1000-1040 PT Time Calculation (min) (ACUTE ONLY): 40 min   Charges:   PT Evaluation $PT Eval Low Complexity: 1 Procedure PT Treatments $Gait Training: 8-22 mins $Therapeutic Exercise: 8-22 mins   PT G Codes:        Rashonda Warrior 08-20-2015, 12:52 PM

## 2015-08-03 NOTE — Clinical Social Work Placement (Signed)
   CLINICAL SOCIAL WORK PLACEMENT  NOTE  Date:  08/03/2015  Patient Details  Name: [name] MRN: [id number] Date of Birth: [birth year]  Clinical Social Work is seeking post-discharge placement for this patient at the [place] level of care (*CSW will initial, date and re-position this form in  chart as items are completed):  No   Patient/family provided with [place] Work Department's list of facilities offering this level of care within the geographic area requested by the patient (or if unable, by the patient's family).  Yes   Patient/family informed of their freedom to choose among providers that offer the needed level of care, that participate in Medicare, Medicaid or managed care program needed by the patient, have an available bed and are willing to accept the patient.  No   Patient/family informed of [facility]'s ownership interest in [facility] and [facility], as well as of the fact that they are under no obligation to receive care at these facilities.  PASRR submitted to EDS on       PASRR number received on       Existing PASRR number confirmed on 08/03/15     FL2 transmitted to all facilities in geographic area requested by pt/family on 08/03/15     FL2 transmitted to all facilities within larger geographic area on       Patient informed that his/her managed care company has contracts with or will negotiate with certain facilities, including the following:        Yes   Patient/family informed of bed offers received.  Patient chooses bed at [facility]     Physician recommends and patient chooses bed at      Patient to be transferred to [facility] on  .  Patient to be transferred to facility by       Patient family notified on   of transfer.  Name of family member notified:        PHYSICIAN       Additional Comment:    _______________________________________________ Luretha Rued, [place] 08/03/2015, 2:34 PM

## 2015-08-03 NOTE — Progress Notes (Signed)
Foley discontinued this am at 06:30, pt due to void by noon today. Pt understands to inform staff of first time voiding since foley removal.

## 2015-08-03 NOTE — Progress Notes (Signed)
Utilization review completed.  

## 2015-08-03 NOTE — Clinical Social Work Note (Signed)
Clinical Social Work Assessment  Patient Details  Name: [name] MRN: [id number] Date of Birth: [birth year]  Date of referral:  08/03/15               Reason for consult:  Discharge Planning, Facility Placement                Permission sought to share information with:  Chartered certified accountant granted to share information::  Yes, Verbal Permission Granted  Name::        Agency::     Relationship::     Contact Information:     Housing/Transportation Living arrangements for the past 2 months:  Single Family Home Source of Information:  Patient, Adult Children Patient Interpreter Needed:  None Criminal Activity/Legal Involvement Pertinent to Current Situation/Hospitalization:  No - Comment as needed Significant Relationships:  Adult Children Lives with:  Self Do you feel safe going back to the place where you live?  No (ST Rehab recommended.) Need for family participation in patient care:  Yes (Comment)  Care giving concerns:  Pt's care cannot be managed at home following hospital d/c.   Social Worker assessment / plan:  Pt hospitalized on 08/02/15 for pre planned left total knee revision. PT has recommended ST Rehab at d/c. CSW has met with pt to assist with d/c planning. Pt is in agreement with recommendations and has requested [facility] for rehab. SNF has been contacted and clinicals sent for review. SNF will accept pt for rehab pending [place] authorization. CSW has contacted insurance and has requested authorization for placement. CSW will continue to follow to assist with d/c planning to SNF.  Employment status:  Retired Nurse, adult PT Recommendations:  [place] / Referral to community resources:  [place]  Patient/Family's Response to care: Pt feels ST Rehab would be helpful prior to returning home.  Patient/Family's Understanding of and Emotional Response to  Diagnosis, Current Treatment, and Prognosis: Pt is aware of her medical status. She is motivated to work with therapy and is looking forward to getting rehab at [facility] and returning home.  Emotional Assessment Appearance:  Appears stated age Attitude/Demeanor/Rapport:  Other (cooperative) Affect (typically observed):  Calm, Pleasant Orientation:  Oriented to Self, Oriented to Place, Oriented to  Time, Oriented to Situation Alcohol / Substance use:  Not Applicable Psych involvement (Current and /or in the community):  No (Comment)  Discharge Needs  Concerns to be addressed:  Discharge Planning Concerns Readmission within the last 30 days:  No Current discharge risk:  None Barriers to Discharge:  No Barriers Identified   [name], [place]  08/03/2015, 2:25 PM

## 2015-08-03 NOTE — NC FL2 (Signed)
[place] LEVEL OF CARE SCREENING TOOL     IDENTIFICATION  Patient Name: [name] Birthdate: 05-23-1930 Sex: female Admission Date (Current Location): 08/02/2015  [address] [employer] and [place] Number:  Herbalist and Address:  [facility],  [place] [address] [place]      Provider Number: [id number]  Attending Physician Name and Address:  Gaynelle Arabian, MD  Relative Name and Phone Number:       Current Level of Care: Hospital Recommended Level of Care: [place] Prior Approval Number:    Date Approved/Denied:   PASRR Number: [id number]  Discharge Plan: SNF    Current Diagnoses: Patient Active Problem List   Diagnosis Date Noted  . Failed total left knee replacement ([place]) 08/02/2015  . Failed total knee, left ([place]) 08/02/2015  . Sigmoid diverticulitis 11/24/2014  . Acute blood loss anemia 06/13/2014  . Neuropathic pain 06/13/2014  . Insomnia 06/13/2014  . Anxiety and depression 06/13/2014  . Bacterial skin infection 06/13/2014  . Cancer of ascending colon ([place]) 06/13/2014  . Generalized weakness 06/13/2014  . Small bowel obstruction ([place]) 05/31/2014  . Colon tumor 05/31/2014  . Colovaginal fistula 04/20/2014  . Weakness 12/27/2013  . Asterixis 12/21/2013  . Hypoxia 09/15/2013  . Lethargy 09/15/2013  . Hypotension, unspecified 09/15/2013  . Dehydration 09/15/2013  . Patella fracture 09/14/2013  . Chest pain 02/19/2011  . Pre-operative cardiovascular examination 02/19/2011  . DEPRESSION 02/07/2010  . EDEMA 11/13/2009  . DYSPNEA 11/13/2009  . DIASTOLIC HEART FAILURE, CHRONIC 02/27/2009  . PURE HYPERCHOLESTEROLEMIA 04/05/2008  . Coronary atherosclerosis 04/05/2008  . Anemia 06/30/2007  . Osteoarthritis 05/19/2007  . Essential hypertension 01/21/2007  . GERD 01/21/2007  . HIATAL HERNIA WITH REFLUX 01/21/2007  . Diverticulosis of large intestine 01/21/2007  . COLONIC POLYPS, HX OF 01/21/2007     Orientation RESPIRATION BLADDER Height & Weight    Self, Time, Situation, Place  Normal Continent      BEHAVIORAL SYMPTOMS/MOOD NEUROLOGICAL BOWEL NUTRITION STATUS  Other (Comment) (no behaviors)   Continent Diet  AMBULATORY STATUS COMMUNICATION OF NEEDS Skin   Extensive Assist Verbally Surgical wounds                       Personal Care Assistance Level of Assistance  Bathing, Feeding, Dressing Bathing Assistance: Limited assistance Feeding assistance: Independent Dressing Assistance: Limited assistance     Functional Limitations Info  Sight, Hearing, Speech Sight Info: Adequate Hearing Info: Adequate Speech Info: Adequate    SPECIAL CARE FACTORS FREQUENCY  PT (By licensed PT), OT (By licensed OT)     PT Frequency: 5 x wk OT Frequency: 5 x wk            Contractures Contractures Info: Not present    Additional Factors Info  Code Status Code Status Info: FULL CODE             Current Medications (08/03/2015):  This is the current hospital active medication list Current Facility-Administered Medications  Medication Dose Route Frequency Provider Last Rate Last Dose  . 0.9 %  sodium chloride infusion   Intravenous Continuous Gaynelle Arabian, MD 75 mL/hr at 08/03/15 0000    . acetaminophen (TYLENOL) tablet 650 mg  650 mg Oral Q6H PRN Gaynelle Arabian, MD       Or  . acetaminophen (TYLENOL) suppository 650 mg  650 mg Rectal Q6H PRN Gaynelle Arabian, MD      . acetaminophen (TYLENOL) tablet 1,000 mg  1,000 mg Oral 4 times per day Gaynelle Arabian, MD   1,000 mg at 08/03/15 0517  . apixaban (ELIQUIS) tablet 2.5 mg  2.5 mg Oral Q12H Gaynelle Arabian, MD   2.5 mg at 08/03/15 0736  . bisacodyl (DULCOLAX) suppository 10 mg  10 mg Rectal Daily PRN Gaynelle Arabian, MD      . dexamethasone (DECADRON) injection 10 mg  10 mg Intravenous Once Gaynelle Arabian, MD      . diphenhydrAMINE (BENADRYL) 12.5 MG/5ML elixir 12.5-25 mg  12.5-25 mg Oral Q4H PRN Gaynelle Arabian, MD      . docusate  sodium (COLACE) capsule 100 mg  100 mg Oral BID Gaynelle Arabian, MD   100 mg at 08/02/15 2100  . esomeprazole (NEXIUM) capsule 20 mg  20 mg Oral Daily Arlee Muslim, PA-C      . ferrous sulfate tablet 325 mg  325 mg Oral BID WC Gaynelle Arabian, MD   325 mg at 08/03/15 0737  . gabapentin (NEURONTIN) capsule 600 mg  600 mg Oral TID Gaynelle Arabian, MD   600 mg at 08/02/15 2214  . LORazepam (ATIVAN) tablet 0.5 mg  0.5 mg Oral BID Gaynelle Arabian, MD   0.5 mg at 08/02/15 2100  . menthol-cetylpyridinium (CEPACOL) lozenge 3 mg  1 lozenge Oral PRN Gaynelle Arabian, MD       Or  . phenol (CHLORASEPTIC) mouth spray 1 spray  1 spray Mouth/Throat PRN Gaynelle Arabian, MD      . methocarbamol (ROBAXIN) tablet 500 mg  500 mg Oral Q6H PRN Gaynelle Arabian, MD   500 mg at 08/03/15 [id number]   Or  . methocarbamol (ROBAXIN) 500 mg in dextrose 5 % 50 mL IVPB  500 mg Intravenous Q6H PRN Gaynelle Arabian, MD      . metoCLOPramide (REGLAN) tablet 5-10 mg  5-10 mg Oral Q8H PRN Gaynelle Arabian, MD       Or  . metoCLOPramide (REGLAN) injection 5-10 mg  5-10 mg Intravenous Q8H PRN Gaynelle Arabian, MD      . metoprolol tartrate (LOPRESSOR) tablet 75 mg  75 mg Oral BID Gaynelle Arabian, MD   75 mg at 08/02/15 2214  . morphine 2 MG/ML injection 1 mg  1 mg Intravenous Q2H PRN Gaynelle Arabian, MD   1 mg at 08/03/15 0518  . nitroGLYCERIN (NITROSTAT) SL tablet 0.4 mg  0.4 mg Sublingual Q5 min PRN Gaynelle Arabian, MD      . ondansetron [facility]) tablet 4 mg  4 mg Oral Q6H PRN Gaynelle Arabian, MD       Or  . ondansetron [facility]) injection 4 mg  4 mg Intravenous Q6H PRN Gaynelle Arabian, MD      . oxyCODONE (Oxy IR/ROXICODONE) immediate release tablet 5-10 mg  5-10 mg Oral Q3H PRN Gaynelle Arabian, MD   10 mg at 08/03/15 [id number]  . polyethylene glycol (MIRALAX / GLYCOLAX) packet 17 g  17 g Oral Daily PRN Gaynelle Arabian, MD      . promethazine (PHENERGAN) tablet 12.5 mg  12.5 mg Oral Q6H PRN Gaynelle Arabian, MD      . sertraline (ZOLOFT) tablet 50 mg  50 mg Oral Daily Gaynelle Arabian, MD      . sodium phosphate (FLEET) 7-19 GM/118ML enema 1 enema  1 enema Rectal Once PRN Gaynelle Arabian, MD      . traMADol Veatrice Bourbon) tablet 50-100 mg  50-100 mg Oral Q6H PRN Gaynelle Arabian, MD      . traZODone (DESYREL) tablet 50 mg  50  mg Oral QHS Gaynelle Arabian, MD   50 mg at 08/02/15 2101     Discharge Medications: Please see discharge summary for a list of discharge medications.  Relevant Imaging Results:  Relevant Lab Results:   Additional Information SS # [id number]  Dontee Jaso, [place] An, LCSW

## 2015-08-03 NOTE — Progress Notes (Signed)
Physical Therapy Treatment Patient Details Name: [name] MRN: [id number] DOB: 08/14/1929 Today's Date: 08/03/2015    History of Present Illness s/p LEFT FEMORAL REVISION (Left); PMH includes prior surgical repairs to both knees, CAD, CHF, COPD, TIA, Colovaginal fistula,Adenocarcinoma (colon)     PT Comments    Pt progressing slowly with mobility - ltd by pain and premorbid deconditioning.  Follow Up Recommendations  SNF     Equipment Recommendations  None recommended by PT    Recommendations for Other Services OT consult     Precautions / Restrictions Precautions Precautions: Knee Required Braces or Orthoses: Knee Immobilizer - Left Knee Immobilizer - Left: Discontinue once straight leg raise with < 10 degree lag Restrictions Weight Bearing Restrictions: No Other Position/Activity Restrictions: WBAT LLE    Mobility  Bed Mobility Overal bed mobility: Needs Assistance Bed Mobility: Sit to Supine       Sit to supine: Min assist;Mod assist   General bed mobility comments: cues for sequence and use of R LE to self assist  Transfers Overall transfer level: Needs assistance Equipment used: Rolling walker (2 wheeled) Transfers: Sit to/from Stand Sit to Stand: Mod assist         General transfer comment: from recliner and to EOB. Assist during powerup to standing and to control descent into sitting.  Ambulation/Gait Ambulation/Gait assistance: Min assist;Mod assist Ambulation Distance (Feet): 36 Feet Assistive device: Rolling walker (2 wheeled) Gait Pattern/deviations: Step-to pattern;Decreased step length - right;Decreased step length - left;Shuffle;Trunk flexed Gait velocity: decr   General Gait Details: cues for posture, position from RW, sequence and saftey with RW   Stairs            Wheelchair Mobility    Modified Rankin (Stroke Patients Only)       Balance Overall balance assessment: Needs assistance Sitting-balance support: No upper  extremity supported;Feet supported Sitting balance-Leahy Scale: Good     Standing balance support: Bilateral upper extremity supported;During functional activity Standing balance-Leahy Scale: Poor Standing balance comment: external support needed for balance                    Cognition Arousal/Alertness: Awake/alert Behavior During Therapy: WFL for tasks assessed/performed Overall Cognitive Status: Within Functional Limits for tasks assessed                      Exercises      General Comments General comments (skin integrity, edema, etc.): Pt's oxygen noted to drop to 85-87 at times on RA, noted during sit<>stand. Recovered quickly to above 90 with deep breaths.       Pertinent Vitals/Pain Pain Assessment: 0-10 Pain Score: 6  Pain Location: L knee Pain Descriptors / Indicators: Aching;Sore Pain Intervention(s): Limited activity within patient's tolerance;Monitored during session;Premedicated before session;Ice applied    Home Living Family/patient expects to be discharged to:: Skilled nursing facility Living Arrangements: Alone             Additional Comments: Pt lives at home alone     Prior Function Level of Independence: Independent with assistive device(s)      Comments: ambulates with rollator   PT Goals (current goals can now be found in the care plan section) Acute Rehab PT Goals Patient Stated Goal: rehab then home PT Goal Formulation: With patient Time For Goal Achievement: 08/09/15 Potential to Achieve Goals: Good Progress towards PT goals: Progressing toward goals    Frequency  7X/week    PT Plan Current plan remains  appropriate    Co-evaluation             End of Session Equipment Utilized During Treatment: Gait belt;Left knee immobilizer Activity Tolerance: Patient tolerated treatment well;Patient limited by pain Patient left: in bed;with call bell/phone within reach;with family/visitor present     Time:  1352-1419 PT Time Calculation (min) (ACUTE ONLY): 27 min  Charges:  $Gait Training: 23-37 mins                    G Codes:      [name] August 20, 2015, 3:05 PM

## 2015-08-03 NOTE — Progress Notes (Addendum)
Occupational Therapy Evaluation Patient Details Name: [name] MRN: [id number] DOB: 12-03-1929 Today's Date: 08/03/2015    History of Present Illness s/p LEFT FEMORAL REVISION (Left); PMH includes prior surgical repairs to both knees, CAD, CHF, COPD, TIA, Colovaginal fistula,Adenocarcinoma (colon)    Clinical Impression   Pt admitted with the above diagnoses and presents with below problem list. Pt will benefit from continued acute OT to address the below listed deficits and maximize independence with BADLs prior to d/c to venue below. PTA pt was mod I with ADLs. Pt is currently mod-max A with LB ADLs, functional mobility, and toilet transfers. OT to continue to follow acutely.      Follow Up Recommendations  SNF    Equipment Recommendations  Other (comment) (defer to next venue)    Recommendations for Other Services       Precautions / Restrictions Precautions Precautions: Knee Required Braces or Orthoses: Knee Immobilizer - Left Restrictions Other Position/Activity Restrictions: WBAT LLE      Mobility Bed Mobility               General bed mobility comments: in recliner  Transfers Overall transfer level: Needs assistance Equipment used: Rolling walker (2 wheeled) Transfers: Sit to/from Stand Sit to Stand: Mod assist         General transfer comment: from recliner and 3n1 over toilet. Assist during powerup to standing and to control descent into sitting.    Balance Overall balance assessment: Needs assistance Sitting-balance support: No upper extremity supported;Feet supported Sitting balance-Leahy Scale: Good     Standing balance support: Bilateral upper extremity supported;During functional activity Standing balance-Leahy Scale: Poor Standing balance comment: external support needed for balance                            ADL Overall ADL's : Needs assistance/impaired Eating/Feeding: Set up;Sitting   Grooming: Set up;Sitting    Upper Body Bathing: Set up;Sitting   Lower Body Bathing: Sit to/from stand;Maximal assistance   Upper Body Dressing : Set up;Sitting   Lower Body Dressing: Sit to/from stand;Maximal assistance Lower Body Dressing Details (indicate cue type and reason): Therapist completed most of task of donning undergarments in sit<>stand, assist for transfer as well. Toilet Transfer: Moderate assistance;Ambulation;RW   Toileting- Clothing Manipulation and Hygiene: Moderate assistance;Sit to/from stand Toileting - Clothing Manipulation Details (indicate cue type and reason): Pt completed most pericare in sitting position. Pt stood for therapist finish pericare.in standing. Assist during transfer as well.  Tub/ Shower Transfer: Moderate assistance   Functional mobility during ADLs: Minimal assistance;Moderate assistance;Rolling walker General ADL Comments: Pt ambulated to/from bathroom with some assist for balance and cues for technique with rw. Pt completed toilet transfer, pericare, and LB dressing as detailed above. ADL education discussed.      Vision     Perception     Praxis      Pertinent Vitals/Pain Pain Assessment: 0-10 Pain Score: 8  Pain Location: L knee Pain Descriptors / Indicators: Aching;Sore Pain Intervention(s): Monitored during session;Repositioned;Utilized relaxation techniques;Ice applied     Hand Dominance Right   Extremity/Trunk Assessment Upper Extremity Assessment Upper Extremity Assessment: Overall WFL for tasks assessed   Lower Extremity Assessment Lower Extremity Assessment: Defer to PT evaluation       Communication Communication Communication: No difficulties   Cognition Arousal/Alertness: Awake/alert Behavior During Therapy: WFL for tasks assessed/performed Overall Cognitive Status: Within Functional Limits for tasks assessed  General Comments       Exercises       Shoulder Instructions      Home Living  Family/patient expects to be discharged to:: Skilled nursing facility Living Arrangements: Alone                               Additional Comments: Pt lives at home alone       Prior Functioning/Environment Level of Independence: Independent with assistive device(s)        Comments: ambulates with rollator    OT Diagnosis: Acute pain   OT Problem List: Impaired balance (sitting and/or standing);Decreased knowledge of use of DME or AE;Decreased knowledge of precautions;Pain   OT Treatment/Interventions: Self-care/ADL training;DME and/or AE instruction;Therapeutic activities;Patient/family education;Balance training    OT Goals(Current goals can be found in the care plan section) Acute Rehab OT Goals Patient Stated Goal: rehab then home OT Goal Formulation: With patient Time For Goal Achievement: 08/10/15 Potential to Achieve Goals: Good ADL Goals Pt Will Perform Lower Body Bathing: with modified independence;with adaptive equipment;sit to/from stand Pt Will Perform Lower Body Dressing: with modified independence;with adaptive equipment;sit to/from stand Pt Will Transfer to Toilet: with modified independence;ambulating (3n1 over toilet) Pt Will Perform Toileting - Clothing Manipulation and hygiene: with modified independence;sit to/from stand  OT Frequency: Min 2X/week   Barriers to D/C: Decreased caregiver support  plans to go to [place] at d/c for further rehab before returning home.       Co-evaluation              End of Session Equipment Utilized During Treatment: Gait belt;Rolling walker;Left knee immobilizer  Activity Tolerance: Patient tolerated treatment well Patient left: in chair;with call bell/phone within reach;with family/visitor present   Time: 1133-1200 OT Time Calculation (min): 27 min Charges:  OT General Charges $OT Visit: 1 Procedure OT Evaluation $OT Eval Low Complexity: 1 Procedure OT Treatments $Self Care/Home Management :  8-22 mins G-Codes:    [name] Aug 11, 2015, 12:30 PM

## 2015-08-04 ENCOUNTER — Encounter (HOSPITAL_COMMUNITY): Payer: Self-pay | Admitting: Orthopedic Surgery

## 2015-08-04 LAB — CBC
HCT: 32.1 % — ABNORMAL LOW (ref 36.0–46.0)
Hemoglobin: 10.4 g/dL — ABNORMAL LOW (ref 12.0–15.0)
MCH: 29.5 pg (ref 26.0–34.0)
MCHC: 32.4 g/dL (ref 30.0–36.0)
MCV: 90.9 fL (ref 78.0–100.0)
Platelets: 130 10*3/uL — ABNORMAL LOW (ref 150–400)
RBC: 3.53 MIL/uL — ABNORMAL LOW (ref 3.87–5.11)
RDW: 14.4 % (ref 11.5–15.5)
WBC: 11.8 10*3/uL — ABNORMAL HIGH (ref 4.0–10.5)

## 2015-08-04 LAB — BASIC METABOLIC PANEL
Anion gap: 11 (ref 5–15)
BUN: 38 mg/dL — ABNORMAL HIGH (ref 6–20)
CO2: 22 mmol/L (ref 22–32)
Calcium: 9 mg/dL (ref 8.9–10.3)
Chloride: 104 mmol/L (ref 101–111)
Creatinine, Ser: 1.37 mg/dL — ABNORMAL HIGH (ref 0.44–1.00)
GFR calc Af Amer: 40 mL/min — ABNORMAL LOW (ref >60)
GFR calc non Af Amer: 35 mL/min — ABNORMAL LOW (ref >60)
Glucose, Bld: 133 mg/dL — ABNORMAL HIGH (ref 65–99)
Potassium: 4.8 mmol/L (ref 3.5–5.1)
Sodium: 137 mmol/L (ref 135–145)

## 2015-08-04 MED ORDER — DOCUSATE SODIUM 100 MG PO CAPS: 100.0000 mg | ORAL_CAPSULE | Freq: Two times a day (BID) | ORAL | Status: DC

## 2015-08-04 MED ORDER — POLYETHYLENE GLYCOL 3350 17 G PO PACK: 17.0000 g | PACK | Freq: Every day | ORAL | Status: DC | PRN

## 2015-08-04 MED ORDER — TRAMADOL HCL 50 MG PO TABS: 50.0000 mg | ORAL_TABLET | Freq: Four times a day (QID) | ORAL | Status: DC | PRN

## 2015-08-04 MED ORDER — OXYCODONE HCL 5 MG PO TABS: 5.0000 mg | ORAL_TABLET | ORAL | Status: DC | PRN

## 2015-08-04 MED ORDER — ONDANSETRON HCL 4 MG PO TABS: 4.0000 mg | ORAL_TABLET | Freq: Four times a day (QID) | ORAL | Status: DC | PRN

## 2015-08-04 MED ORDER — METOCLOPRAMIDE HCL 5 MG PO TABS: 5.0000 mg | ORAL_TABLET | Freq: Three times a day (TID) | ORAL | Status: DC | PRN

## 2015-08-04 MED ORDER — FLEET ENEMA 7-19 GM/118ML RE ENEM: 1.0000 | ENEMA | Freq: Once | RECTAL | Status: DC | PRN

## 2015-08-04 MED ORDER — LORAZEPAM 0.5 MG PO TABS: 0.5000 mg | ORAL_TABLET | Freq: Two times a day (BID) | ORAL | Status: DC

## 2015-08-04 MED ORDER — METHOCARBAMOL 500 MG PO TABS: 500.0000 mg | ORAL_TABLET | Freq: Four times a day (QID) | ORAL | Status: DC | PRN

## 2015-08-04 MED ORDER — DIPHENHYDRAMINE HCL 12.5 MG/5ML PO ELIX: 12.5000 mg | ORAL_SOLUTION | ORAL | Status: DC | PRN

## 2015-08-04 MED ORDER — APIXABAN 2.5 MG PO TABS: 2.5000 mg | ORAL_TABLET | Freq: Two times a day (BID) | ORAL | Status: DC

## 2015-08-04 NOTE — Progress Notes (Signed)
PHARMACY BRIEF NOTE:  GABAPENTIN DOSAGE RECOMMENDATION  Pt on gabapentin 600 mg TID as PTA  SCr 1.37,  Est CrCl ~ 32 mL/min  For CrCl 30 - 59 mL/min, maximum recommended dosage of gabapentin is 1400mg /day.   Higher dosages can increase risk of dizziness, ataxia, and other CNS effects.   Recommend:  Consider reducing gabapentin dosage to 400 mg PO TID.   Clayburn Pert, PharmD, BCPS Pager: [phone] 08/04/2015  6:29 AM

## 2015-08-04 NOTE — Progress Notes (Signed)
Physical Therapy Treatment Patient Details Name: [name] MRN: [id number] DOB: 1930-04-26 Today's Date: 08/04/2015    History of Present Illness s/p LEFT FEMORAL REVISION (Left); PMH includes prior surgical repairs to both knees, CAD, CHF, COPD, TIA, Colovaginal fistula,Adenocarcinoma (colon)     PT Comments    POD # 2 Pt progressing slowly.  Requires increased time and increased assist.  Pt will need ST Rehab at SNF prior to returning home.   Follow Up Recommendations  SNF     Equipment Recommendations  None recommended by PT    Recommendations for Other Services       Precautions / Restrictions Precautions Precautions: Knee Precaution Comments: instructed on KI use for amb Required Braces or Orthoses: Knee Immobilizer - Left Knee Immobilizer - Left: Discontinue once straight leg raise with < 10 degree lag Restrictions Weight Bearing Restrictions: No Other Position/Activity Restrictions: WBAT LLE    Mobility  Bed Mobility               General bed mobility comments: Pt OOB in recliner  Transfers Overall transfer level: Needs assistance Equipment used: Rolling walker (2 wheeled) Transfers: Sit to/from Stand Sit to Stand: Mod assist         General transfer comment: 25% VC's on proper tech and hand placement  Ambulation/Gait Ambulation/Gait assistance: Min assist;Mod assist Ambulation Distance (Feet): 28 Feet Assistive device: Rolling walker (2 wheeled) Gait Pattern/deviations: Step-to pattern;Decreased stance time - left Gait velocity: decr   General Gait Details: cues for posture, position from RW, sequence and saftey with RW.  increaased time.     Stairs            Wheelchair Mobility    Modified Rankin (Stroke Patients Only)       Balance                                    Cognition                            Exercises      General Comments        Pertinent Vitals/Pain Pain Assessment:  0-10 Pain Score: 5  Pain Location: L knee Pain Descriptors / Indicators: Sore;Tender Pain Intervention(s): Monitored during session;Premedicated before session;Repositioned;Ice applied    Home Living                      Prior Function            PT Goals (current goals can now be found in the care plan section) Progress towards PT goals: Progressing toward goals    Frequency  7X/week    PT Plan Current plan remains appropriate    Co-evaluation             End of Session Equipment Utilized During Treatment: Gait belt;Left knee immobilizer Activity Tolerance: Patient tolerated treatment well;Patient limited by pain Patient left: in bed;with call bell/phone within reach;with family/visitor present     Time: [id number] PT Time Calculation (min) (ACUTE ONLY): 26 min  Charges:  $Gait Training: 8-22 mins $Therapeutic Activity: 8-22 mins                    G Codes:      Rica Koyanagi  PTA [facility]  Acute  Rehab Pager      [phone]

## 2015-08-04 NOTE — Discharge Summary (Signed)
Physician Discharge Summary   Patient ID: [name] MRN: [id number] DOB/AGE: 1929/07/22 82 y.o.  Admit date: 08/02/2015 Discharge date: 08-04-2015  Primary Diagnosis:  Failed left total knee arthroplasty.  Admission Diagnoses:  Past Medical History  Diagnosis Date  . CAD (coronary artery disease)     a. LHC 4/09: pLAD 40, mDx 70-75, pLCx 30, mLCx 50, mRCA 90, EF 60% >> PCI: BMS x2 to RCA;  b. Myoview 8/12: normal  . Essential hypertension   . Chronic diastolic CHF (congestive heart failure) (HCC)     a. Echo 912 - Mild LVH, EF 55-60%, no RWMA, Gr 1 DD, mild MR, mild LAE, mild RAE, PASP 59 mmHg (mod to severe pulmo HTN)  . History of TIA (transient ischemic attack)     a. Head CT 6/15: small chronic lacunar infarct in thalamus  . Hyperlipidemia   . Depression   . COPD with chronic bronchitis ([place])   . GERD (gastroesophageal reflux disease)   . History of hiatal hernia   . History of DVT of lower extremity   . Sigmoid diverticulosis     s/p sigmoid colectomy  . Colovaginal fistula     s/p colostomy >> colostomy takedown 5/16  . History of adenomatous polyp of colon   . Renal insufficiency   . OA (osteoarthritis)   . Wears glasses   . Wears dentures   . Colostomy in place [facility])     s/p colostomy takedown 5/16  . Peripheral neuropathy ([place])   . Chronic anemia   . History of blood transfusion   . Allergy   . Anxiety   . Cataract     bil cateracts removed  . Pulmonary HTN ([place])     a. PASP on Echo in 9/12:  59 mmHg  . Dysrhythmia     A fib  . Stroke ([place])     TIAs  . Pneumonia   . Adenocarcinoma, colon ([place]) Oncologist-- Dr Truitt Merle    Multifocal (2) Colon cancer @ ileocecal valve and ascending ( [id number]), Stage IIIB, grade I, MMR normal , 2 of 16 +lymph nodes, negative surgical margins---  06-02-2014  Right hemicolectomy w/ colostomy   Discharge Diagnoses:   Principal Problem:   Failed total left knee replacement ([place]) Active Problems:   Failed total knee, left  ([place])  Estimated body mass index is 31.21 kg/(m^2) as calculated from the following:   Height as of this encounter: 5' 3.5" (1.613 m).   Weight as of this encounter: 81.194 kg (179 lb).  Procedure:  Procedure(s) (LRB): LEFT FEMORAL REVISION (Left)   Consults: None  HPI: [name] is an 82 year old female, complex history regard to her left knee. She had a primary total knee and then I ended up doing a tibial revision with a long cemented stem about 15 years ago. She has done very well with that, but then recently developed significant knee pain again. X-ray show loosening of her primary femoral component. It was grossly loose and she was in significant varus. She presents now for femoral versus total knee revision.  Laboratory Data: Admission on 08/02/2015  Component Date Value Ref Range Status  . WBC 08/03/2015 10.0  4.0 - 10.5 K/uL Final  . RBC 08/03/2015 3.76* 3.87 - 5.11 MIL/uL Final  . Hemoglobin 08/03/2015 11.1* 12.0 - 15.0 g/dL Final  . HCT 08/03/2015 35.2* 36.0 - 46.0 % Final  . MCV 08/03/2015 93.6  78.0 - 100.0 fL Final  . MCH 08/03/2015 29.5  26.0 -  34.0 pg Final  . MCHC 08/03/2015 31.5  30.0 - 36.0 g/dL Final  . RDW 08/03/2015 14.3  11.5 - 15.5 % Final  . Platelets 08/03/2015 172  150 - 400 K/uL Final  . Sodium 08/03/2015 138  135 - 145 mmol/L Final  . Potassium 08/03/2015 5.2* 3.5 - 5.1 mmol/L Final  . Chloride 08/03/2015 104  101 - 111 mmol/L Final  . CO2 08/03/2015 24  22 - 32 mmol/L Final  . Glucose, Bld 08/03/2015 169* 65 - 99 mg/dL Final  . BUN 08/03/2015 45* 6 - 20 mg/dL Final  . Creatinine, Ser 08/03/2015 1.34* 0.44 - 1.00 mg/dL Final  . Calcium 08/03/2015 8.8* 8.9 - 10.3 mg/dL Final  . GFR calc non Af Amer 08/03/2015 36* >60 mL/min Final  . GFR calc Af Amer 08/03/2015 41* >60 mL/min Final   Comment: (NOTE) The eGFR has been calculated using the CKD EPI equation. This calculation has not been validated in all clinical situations. eGFR's persistently  <60 mL/min signify possible Chronic Kidney Disease.   . Anion gap 08/03/2015 10  5 - 15 Final  . WBC 08/04/2015 11.8* 4.0 - 10.5 K/uL Final  . RBC 08/04/2015 3.53* 3.87 - 5.11 MIL/uL Final  . Hemoglobin 08/04/2015 10.4* 12.0 - 15.0 g/dL Final  . HCT 08/04/2015 32.1* 36.0 - 46.0 % Final  . MCV 08/04/2015 90.9  78.0 - 100.0 fL Final  . MCH 08/04/2015 29.5  26.0 - 34.0 pg Final  . MCHC 08/04/2015 32.4  30.0 - 36.0 g/dL Final  . RDW 08/04/2015 14.4  11.5 - 15.5 % Final  . Platelets 08/04/2015 130* 150 - 400 K/uL Final  . Sodium 08/04/2015 137  135 - 145 mmol/L Final  . Potassium 08/04/2015 4.8  3.5 - 5.1 mmol/L Final  . Chloride 08/04/2015 104  101 - 111 mmol/L Final  . CO2 08/04/2015 22  22 - 32 mmol/L Final  . Glucose, Bld 08/04/2015 133* 65 - 99 mg/dL Final  . BUN 08/04/2015 38* 6 - 20 mg/dL Final  . Creatinine, Ser 08/04/2015 1.37* 0.44 - 1.00 mg/dL Final  . Calcium 08/04/2015 9.0  8.9 - 10.3 mg/dL Final  . GFR calc non Af Amer 08/04/2015 35* >60 mL/min Final  . GFR calc Af Amer 08/04/2015 40* >60 mL/min Final   Comment: (NOTE) The eGFR has been calculated using the CKD EPI equation. This calculation has not been validated in all clinical situations. eGFR's persistently <60 mL/min signify possible Chronic Kidney Disease.   Georgiann Hahn gap 08/04/2015 11  5 - 15 Final  Hospital Outpatient Visit on 07/28/2015  Component Date Value Ref Range Status  . MRSA, PCR 07/28/2015 NEGATIVE  NEGATIVE Final  . Staphylococcus aureus 07/28/2015 POSITIVE* NEGATIVE Final   Comment:        The Xpert SA Assay (FDA approved for NASAL specimens in patients over 47 years of age), is one component of a comprehensive surveillance program.  Test performance has been validated by [facility] for patients greater than or equal to 65 year old. It is not intended to diagnose infection nor to guide or monitor treatment.   Marland Kitchen aPTT 07/28/2015 35  24 - 37 seconds Final  . WBC 07/28/2015 8.7  4.0 - 10.5 K/uL  Final  . RBC 07/28/2015 4.31  3.87 - 5.11 MIL/uL Final  . Hemoglobin 07/28/2015 12.9  12.0 - 15.0 g/dL Final  . HCT 07/28/2015 40.6  36.0 - 46.0 % Final  . MCV 07/28/2015 94.2  78.0 -  100.0 fL Final  . MCH 07/28/2015 29.9  26.0 - 34.0 pg Final  . MCHC 07/28/2015 31.8  30.0 - 36.0 g/dL Final  . RDW 07/28/2015 14.6  11.5 - 15.5 % Final  . Platelets 07/28/2015 203  150 - 400 K/uL Final  . Sodium 07/28/2015 142  135 - 145 mmol/L Final  . Potassium 07/28/2015 4.7  3.5 - 5.1 mmol/L Final  . Chloride 07/28/2015 104  101 - 111 mmol/L Final  . CO2 07/28/2015 26  22 - 32 mmol/L Final  . Glucose, Bld 07/28/2015 101* 65 - 99 mg/dL Final  . BUN 07/28/2015 33* 6 - 20 mg/dL Final  . Creatinine, Ser 07/28/2015 1.61* 0.44 - 1.00 mg/dL Final  . Calcium 07/28/2015 9.5  8.9 - 10.3 mg/dL Final  . Total Protein 07/28/2015 8.2* 6.5 - 8.1 g/dL Final  . Albumin 07/28/2015 4.3  3.5 - 5.0 g/dL Final  . AST 07/28/2015 31  15 - 41 U/L Final  . ALT 07/28/2015 29  14 - 54 U/L Final  . Alkaline Phosphatase 07/28/2015 71  38 - 126 U/L Final  . Total Bilirubin 07/28/2015 0.6  0.3 - 1.2 mg/dL Final  . GFR calc non Af Amer 07/28/2015 28* >60 mL/min Final  . GFR calc Af Amer 07/28/2015 33* >60 mL/min Final   Comment: (NOTE) The eGFR has been calculated using the CKD EPI equation. This calculation has not been validated in all clinical situations. eGFR's persistently <60 mL/min signify possible Chronic Kidney Disease.   . Anion gap 07/28/2015 12  5 - 15 Final  . Prothrombin Time 07/28/2015 15.3* 11.6 - 15.2 seconds Final  . INR 07/28/2015 1.20  0.00 - 1.49 Final  . ABO/RH(D) 07/28/2015 A POS   Final  . Antibody Screen 07/28/2015 NEG   Final  . Sample Expiration 07/28/2015 08/05/2015   Final  . Extend sample reason 07/28/2015 NO TRANSFUSIONS OR PREGNANCY IN THE PAST 3 MONTHS   Final  . Color, Urine 07/28/2015 YELLOW  YELLOW Final  . APPearance 07/28/2015 CLEAR  CLEAR Final  . Specific Gravity, Urine 07/28/2015  1.009  1.005 - 1.030 Final  . pH 07/28/2015 5.5  5.0 - 8.0 Final  . Glucose, UA 07/28/2015 NEGATIVE  NEGATIVE mg/dL Final  . Hgb urine dipstick 07/28/2015 NEGATIVE  NEGATIVE Final  . Bilirubin Urine 07/28/2015 NEGATIVE  NEGATIVE Final  . Ketones, ur 07/28/2015 NEGATIVE  NEGATIVE mg/dL Final  . Protein, ur 07/28/2015 NEGATIVE  NEGATIVE mg/dL Final  . Nitrite 07/28/2015 NEGATIVE  NEGATIVE Final  . Leukocytes, UA 07/28/2015 NEGATIVE  NEGATIVE Final   MICROSCOPIC NOT DONE ON URINES WITH NEGATIVE PROTEIN, BLOOD, LEUKOCYTES, NITRITE, OR GLUCOSE <1000 mg/dL.  Office Visit on 07/25/2015  Component Date Value Ref Range Status  . Sodium 07/25/2015 141  135 - 146 mmol/L Final  . Potassium 07/25/2015 4.2  3.5 - 5.3 mmol/L Final  . Chloride 07/25/2015 104  98 - 110 mmol/L Final  . CO2 07/25/2015 23  20 - 31 mmol/L Final  . Glucose, Bld 07/25/2015 115* 65 - 99 mg/dL Final  . BUN 07/25/2015 21  7 - 25 mg/dL Final  . Creat 07/25/2015 1.27* 0.60 - 0.88 mg/dL Final  . Calcium 07/25/2015 9.8  8.6 - 10.4 mg/dL Final  . Brain Natriuretic Peptide 07/25/2015 443.2* 0.0 - 100.0 pg/mL Final     X-Rays:No results found.  EKG: Orders placed or performed in visit on 07/27/15  . EKG 12-Lead     Hospital Course: [name]  is a 82 y.o. who was admitted to [facility]. They were brought to the operating room on 08/02/2015 and underwent Procedure(s): LEFT FEMORAL REVISION.  Patient tolerated the procedure well and was later transferred to the recovery room and then to the orthopaedic floor for postoperative care.  They were given PO and IV analgesics for pain control following their surgery.  They were given 24 hours of postoperative antibiotics of  Anti-infectives    Start     Dose/Rate Route Frequency Ordered Stop   08/02/15 2200  ceFAZolin (ANCEF) IVPB 2 g/50 mL premix     2 g 100 mL/hr over 30 Minutes Intravenous Every 6 hours 08/02/15 1823 08/03/15 0507   08/02/15 1315  ceFAZolin (ANCEF) IVPB 2  g/50 mL premix     2 g 100 mL/hr over 30 Minutes Intravenous On call to O.R. 08/02/15 1258 08/02/15 1512     and started on DVT prophylaxis in the form of Eliquis.   PT and OT were ordered for total joint protocol.  Discharge planning consulted to help with postop disposition and equipment needs.  Patient had a decent night on the evening of surgery.  They started to get up OOB with therapy on day one. Hemovac drain was pulled without difficulty.  Continued to work with therapy into day two.  Dressing was changed on day two and the incision was healing well.   Patient was seen in rounds on POD 2 and it was felt that as long as the patient does well with PT and the bed is aavailable, then the patient should be ready to transfer over today, Friday 08/04/2015.  Discharge to SNF Diet - Cardiac diet Follow up - in 2 weeks Activity - WBAT Disposition - Skilled nursing facility Condition Upon Discharge - Good D/C Meds - See DC Summary DVT Prophylaxis - Eliquis  Discharge Instructions    Call MD / Call 911    Complete by:  As directed   If you experience chest pain or shortness of breath, CALL 911 and be transported to the hospital emergency room.  If you develope a fever above 101 F, pus (white drainage) or increased drainage or redness at the wound, or calf pain, call your surgeon's office.     Change dressing    Complete by:  As directed   Change dressing daily with sterile 4 x 4 inch gauze dressing and apply TED hose. Do not submerge the incision under water.     Constipation Prevention    Complete by:  As directed   Drink plenty of fluids.  Prune juice [place] be helpful.  You Bickle use a stool softener, such as Colace (over the counter) 100 mg twice a day.  Use MiraLax (over the counter) for constipation as needed.     Diet - low sodium heart healthy    Complete by:  As directed      Discharge instructions    Complete by:  As directed   Pick up stool softner and laxative for home use following  surgery while on pain medications. Do not submerge incision under water. Please use good hand washing techniques while changing dressing each day. [place]aker shower starting three days after surgery. Please use a clean towel to pat the incision dry following showers. Continue to use ice for pain and swelling after surgery. Do not use any lotions or creams on the incision until instructed by your surgeon.  Take Eliquis twice a day for two and a half  more weeks, then discontinue Eliquis. Once the patient has completed the Xarelto, they Mote resume the 325 mg Aspirin.  Postoperative Constipation Protocol  Constipation - defined medically as fewer than three stools per week and severe constipation as less than one stool per week.  One of the most common issues patients have following surgery is constipation.  Even if you have a regular bowel pattern at home, your normal regimen is likely to be disrupted due to multiple reasons following surgery.  Combination of anesthesia, postoperative narcotics, change in appetite and fluid intake all can affect your bowels.  In order to avoid complications following surgery, here are some recommendations in order to help you during your recovery period.  Colace (docusate) - Pick up an over-the-counter form of Colace or another stool softener and take twice a day as long as you are requiring postoperative pain medications.  Take with a full glass of water daily.  If you experience loose stools or diarrhea, hold the colace until you stool forms back up.  If your symptoms do not get better within 1 week or if they get worse, check with your doctor.  Dulcolax (bisacodyl) - Pick up over-the-counter and take as directed by the product packaging as needed to assist with the movement of your bowels.  Take with a full glass of water.  Use this product as needed if not relieved by Colace only.   MiraLax (polyethylene glycol) - Pick up over-the-counter to have on hand.  MiraLax is  a solution that will increase the amount of water in your bowels to assist with bowel movements.  Take as directed and can mix with a glass of water, juice, soda, coffee, or tea.  Take if you go more than two days without a movement. Do not use MiraLax more than once per day. Call your doctor if you are still constipated or irregular after using this medication for 7 days in a row.  If you continue to have problems with postoperative constipation, please contact the office for further assistance and recommendations.  If you experience "the worst abdominal pain ever" or develop nausea or vomiting, please contact the office immediatly for further recommendations for treatment.  When discharged from the skilled rehab facility, please have the facility set up the patient's [place] prior to being released.  Please make sure this gets set up prior to release in order to avoid any lapse of therapy following the rehab stay.  Also provide the patient with their medications at time of release from the facility to include their pain medication, the muscle relaxants, and their blood thinner medication.  If the patient is still at the rehab facility at time of follow up appointment, please also assist the patient in arranging follow up appointment in our office and any transportation needs. ICE to the affected knee or hip every three hours for 30 minutes at a time and then as needed for pain and swelling.     Do not put a pillow under the knee. Place it under the heel.    Complete by:  As directed      Do not sit on low chairs, stoools or toilet seats, as it [place] be difficult to get up from low surfaces    Complete by:  As directed      Driving restrictions    Complete by:  As directed   No driving until released by the physician.     Increase activity slowly as tolerated  Complete by:  As directed      Lifting restrictions    Complete by:  As directed   No lifting until released by the  physician.     Patient [place] shower    Complete by:  As directed   You [place] shower without a dressing once there is no drainage.  Do not wash over the wound.  If drainage remains, do not shower until drainage stops.     TED hose    Complete by:  As directed   Use stockings (TED hose) for 3 weeks on both leg(s).  You Duma remove them at night for sleeping.     Weight bearing as tolerated    Complete by:  As directed   Laterality:  left  Extremity:  Lower            Medication List    STOP taking these medications        aspirin EC 325 MG tablet     cephALEXin 500 MG capsule  Commonly known as:  KEFLEX     cholecalciferol 1000 units tablet  Commonly known as:  VITAMIN D     HYDROcodone-acetaminophen 5-325 MG tablet  Commonly known as:  NORCO/VICODIN     Vitamin C 500 MG Caps      TAKE these medications        acetaminophen 325 MG tablet  Commonly known as:  TYLENOL  Take 650 mg by mouth every 6 (six) hours as needed for moderate pain.     apixaban 2.5 MG Tabs tablet  Commonly known as:  ELIQUIS  Take 1 tablet (2.5 mg total) by mouth every 12 (twelve) hours. Take Eliquis twice a day for two and a half more weeks, then discontinue Eliquis. Once the patient has completed the Xarelto, they Aoun resume the 325 mg Aspirin.     benazepril 40 MG tablet  Commonly known as:  LOTENSIN  Take 40 mg by mouth daily.     diphenhydrAMINE 12.5 MG/5ML elixir  Commonly known as:  BENADRYL  Take 5-10 mLs (12.5-25 mg total) by mouth every 4 (four) hours as needed for itching.     docusate sodium 100 MG capsule  Commonly known as:  COLACE  Take 1 capsule (100 mg total) by mouth 2 (two) times daily.     esomeprazole 20 MG capsule  Commonly known as:  NEXIUM  Take 20 mg by mouth daily at 12 noon.     ferrous sulfate 325 (65 FE) MG tablet  Take 1 tablet (325 mg total) by mouth 2 (two) times daily with a meal.     furosemide 20 MG tablet  Commonly known as:  LASIX  Take 20 mg daily  today and tomorrow then decrease to 20 mg daily on Mondays, Wednesdays and Fridays     gabapentin 600 MG tablet  Commonly known as:  NEURONTIN  Take 1 tablet by mouth 3 (three) times daily.     LORazepam 0.5 MG tablet  Commonly known as:  ATIVAN  Take 1 tablet (0.5 mg total) by mouth 2 (two) times daily.     methocarbamol 500 MG tablet  Commonly known as:  ROBAXIN  Take 1 tablet (500 mg total) by mouth every 6 (six) hours as needed for muscle spasms.     metoCLOPramide 5 MG tablet  Commonly known as:  REGLAN  Take 1 tablet (5 mg total) by mouth every 8 (eight) hours as needed for nausea (if ondansetron (ZOFRAN) ineffective.).  metoprolol 50 MG tablet  Commonly known as:  LOPRESSOR  Take 1.5 tablets (75 mg total) by mouth 2 (two) times daily.     nitroGLYCERIN 0.4 MG SL tablet  Commonly known as:  NITROSTAT  Place 0.4 mg under the tongue every 5 (five) minutes as needed for chest pain (x 3 pills). Reported on 07/25/2015     ondansetron 4 MG tablet  Commonly known as:  ZOFRAN  Take 1 tablet (4 mg total) by mouth every 6 (six) hours as needed for nausea.     oxyCODONE 5 MG immediate release tablet  Commonly known as:  Oxy IR/ROXICODONE  Take 1-2 tablets (5-10 mg total) by mouth every 3 (three) hours as needed for moderate pain or severe pain.     polyethylene glycol packet  Commonly known as:  MIRALAX / GLYCOLAX  Take 17 g by mouth daily as needed for mild constipation.     promethazine 12.5 MG tablet  Commonly known as:  PHENERGAN  Take 1 tablet by mouth every 6 (six) hours as needed for nausea or vomiting. Nausea/vomitting     sertraline 50 MG tablet  Commonly known as:  ZOLOFT  Take 1 tablet by mouth daily. Reported on 07/25/2015     sodium phosphate 7-19 GM/118ML Enem  Place 133 mLs (1 enema total) rectally once as needed for severe constipation.     spironolactone 25 MG tablet  Commonly known as:  ALDACTONE  Take 25 mg by mouth daily.     SYSTANE OP  Place 1  drop into both eyes 2 (two) times daily as needed (for dry eyes).     traMADol 50 MG tablet  Commonly known as:  ULTRAM  Take 1-2 tablets (50-100 mg total) by mouth every 6 (six) hours as needed (mild pain).     traZODone 100 MG tablet  Commonly known as:  DESYREL  Take 50 mg by mouth at bedtime. Take one half tablet (50 mg) as needed at bedtime for sleep     triamcinolone cream 0.1 %  Commonly known as:  KENALOG  Apply 1 application topically 2 (two) times daily as needed (Eczema on legs). Reported on 07/25/2015           Follow-up Information    Follow up with [facility] .   Specialty:  [place] information:   [address] [place] [phone]      Follow up with Gearlean Alf, MD. Schedule an appointment as soon as possible for a visit on 08/15/2015.   Specialty:  Orthopedic Surgery   Why:  Call office ASAP at [phone] to setup appointment on Tuesday 08/15/2015 with Dr. Wynelle Link.   Contact information:   [address] [place] [address] [phone]       Signed: Arlee Muslim, PA-C Orthopaedic Surgery 08/04/2015, 12:02 PM

## 2015-08-04 NOTE — Progress Notes (Signed)
Subjective: 2 Days Post-Op Procedure(s) (LRB): LEFT FEMORAL REVISION (Left) Patient reports pain as mild.   Patient seen in rounds with Dr. Wynelle Link. Patient is well, and has had no acute complaints or problems Patient is ready to go to Rehab if bed available.  Objective: Vital signs in last 24 hours: Temp:  [97.6 F (36.4 C)-98.8 F (37.1 C)] 97.6 F (36.4 C) (01/20 0533) Pulse Rate:  [93-94] 93 (01/20 0533) Resp:  [16] 16 (01/20 0533) BP: (122-148)/(57-87) 148/87 mmHg (01/20 0533) SpO2:  [96 %-100 %] 100 % (01/20 0533)  Intake/Output from previous day:  Intake/Output Summary (Last 24 hours) at 08/04/15 1149 Last data filed at 08/04/15 1051  Gross per 24 hour  Intake 1456.67 ml  Output    750 ml  Net 706.67 ml    Intake/Output this shift: Total I/O In: 240 [P.O.:240] Out: 750 [Urine:750]  Labs:  Recent Labs  08/03/15 0430 08/04/15 0400  HGB 11.1* 10.4*    Recent Labs  08/03/15 0430 08/04/15 0400  WBC 10.0 11.8*  RBC 3.76* 3.53*  HCT 35.2* 32.1*  PLT 172 130*    Recent Labs  08/03/15 0430 08/04/15 0400  NA 138 137  K 5.2* 4.8  CL 104 104  CO2 24 22  BUN 45* 38*  CREATININE 1.34* 1.37*  GLUCOSE 169* 133*  CALCIUM 8.8* 9.0   No results for input(s): LABPT, INR in the last 72 hours.  EXAM: General - Patient is Alert, Appropriate and Oriented Extremity - Neurovascular intact Sensation intact distally Dorsiflexion/Plantar flexion intact Incision - clean, dry, no drainage Motor Function - intact, moving foot and toes well on exam.   Assessment/Plan: 2 Days Post-Op Procedure(s) (LRB): LEFT FEMORAL REVISION (Left) Procedure(s) (LRB): LEFT FEMORAL REVISION (Left) Past Medical History  Diagnosis Date  . CAD (coronary artery disease)     a. LHC 4/09: pLAD 40, mDx 70-75, pLCx 30, mLCx 50, mRCA 90, EF 60% >> PCI: BMS x2 to RCA;  b. Myoview 8/12: normal  . Essential hypertension   . Chronic diastolic CHF (congestive heart failure) (HCC)    a. Echo 912 - Mild LVH, EF 55-60%, no RWMA, Gr 1 DD, mild MR, mild LAE, mild RAE, PASP 59 mmHg (mod to severe pulmo HTN)  . History of TIA (transient ischemic attack)     a. Head CT 6/15: small chronic lacunar infarct in thalamus  . Hyperlipidemia   . Depression   . COPD with chronic bronchitis ([place])   . GERD (gastroesophageal reflux disease)   . History of hiatal hernia   . History of DVT of lower extremity   . Sigmoid diverticulosis     s/p sigmoid colectomy  . Colovaginal fistula     s/p colostomy >> colostomy takedown 5/16  . History of adenomatous polyp of colon   . Renal insufficiency   . OA (osteoarthritis)   . Wears glasses   . Wears dentures   . Colostomy in place [facility])     s/p colostomy takedown 5/16  . Peripheral neuropathy ([place])   . Chronic anemia   . History of blood transfusion   . Allergy   . Anxiety   . Cataract     bil cateracts removed  . Pulmonary HTN ([place])     a. PASP on Echo in 9/12:  59 mmHg  . Dysrhythmia     A fib  . Stroke ([place])     TIAs  . Pneumonia   . Adenocarcinoma, colon [facility]) Oncologist-- Dr Krista Blue  Feng    Multifocal (2) Colon cancer @ ileocecal valve and ascending ( mT4N1Mx), Stage IIIB, grade I, MMR normal , 2 of 16 +lymph nodes, negative surgical margins---  06-02-2014  Right hemicolectomy w/ colostomy   Principal Problem:   Failed total left knee replacement (HCC) Active Problems:   Failed total knee, left (HCC)  Estimated body mass index is 31.21 kg/(m^2) as calculated from the following:   Height as of this encounter: 5' 3.5" (1.613 m).   Weight as of this encounter: 81.194 kg (179 lb). Up with therapy Discharge to SNF Diet - Cardiac diet Follow up - in 2 weeks Activity - WBAT Disposition - Skilled nursing facility Condition Upon Discharge - Good D/C Meds - See DC Summary DVT Prophylaxis - Eliquis  Arlee Muslim, PA-C Orthopaedic Surgery 08/04/2015, 11:49 AM

## 2015-08-04 NOTE — Clinical Social Work Placement (Signed)
   CLINICAL SOCIAL WORK PLACEMENT  NOTE  Date:  08/04/2015  Patient Details  Name: [name] MRN: [id number] Date of Birth: 01/25/1930  Clinical Social Work is seeking post-discharge placement for this patient at the [place] level of care (*CSW will initial, date and re-position this form in  chart as items are completed):  No   Patient/family provided with [facility] Work Department's list of facilities offering this level of care within the geographic area requested by the patient (or if unable, by the patient's family).  Yes   Patient/family informed of their freedom to choose among providers that offer the needed level of care, that participate in Medicare, Medicaid or managed care program needed by the patient, have an available bed and are willing to accept the patient.  No   Patient/family informed of [facility]'s ownership interest in [facility] and [facility], as well as of the fact that they are under no obligation to receive care at these facilities.  PASRR submitted to EDS on       PASRR number received on       Existing PASRR number confirmed on 08/03/15     FL2 transmitted to all facilities in geographic area requested by pt/family on 08/03/15     FL2 transmitted to all facilities within larger geographic area on       Patient informed that his/her managed care company has contracts with or will negotiate with certain facilities, including the following:        Yes   Patient/family informed of bed offers received.  Patient chooses bed at [facility]     Physician recommends and patient chooses bed at      Patient to be transferred to [facility] on 08/04/15.  Patient to be transferred to facility by [place]     Patient family notified on 08/04/15 of transfer.  Name of family member notified:  SON     PHYSICIAN       Additional Comment: Pt / son are in agreement with d/c to [facility] today. [employer] has  provided authorization for SNF placement. PT has approved transport by car. NSG has reviewed d/c summary, scripts, avs. Scripts included in d/c packet. D/C packet provided to pt prior to d/c. D/C Summary sent to SNF for review prior to d/c.   _______________________________________________ Luretha Rued, LCSW 08/04/2015, 2:01 PM

## 2015-08-07 ENCOUNTER — Encounter (HOSPITAL_COMMUNITY): Payer: Self-pay | Admitting: Orthopedic Surgery

## 2015-08-08 ENCOUNTER — Non-Acute Institutional Stay (SKILLED_NURSING_FACILITY): Payer: Commercial Managed Care - HMO | Admitting: Internal Medicine

## 2015-08-08 DIAGNOSIS — D62 Acute posthemorrhagic anemia: Secondary | ICD-10-CM

## 2015-08-08 DIAGNOSIS — K5900 Constipation, unspecified: Secondary | ICD-10-CM | POA: Diagnosis not present

## 2015-08-08 DIAGNOSIS — T84093A Other mechanical complication of internal left knee prosthesis, initial encounter: Secondary | ICD-10-CM

## 2015-08-08 DIAGNOSIS — D72829 Elevated white blood cell count, unspecified: Secondary | ICD-10-CM

## 2015-08-08 DIAGNOSIS — R2681 Unsteadiness on feet: Secondary | ICD-10-CM | POA: Diagnosis not present

## 2015-08-08 DIAGNOSIS — K219 Gastro-esophageal reflux disease without esophagitis: Secondary | ICD-10-CM

## 2015-08-08 DIAGNOSIS — I10 Essential (primary) hypertension: Secondary | ICD-10-CM | POA: Diagnosis not present

## 2015-08-08 DIAGNOSIS — I2510 Atherosclerotic heart disease of native coronary artery without angina pectoris: Secondary | ICD-10-CM

## 2015-08-08 DIAGNOSIS — I5032 Chronic diastolic (congestive) heart failure: Secondary | ICD-10-CM | POA: Diagnosis not present

## 2015-08-08 NOTE — Progress Notes (Signed)
Patient ID: [name], female   DOB: 1929/10/02, 82 y.o.   MRN: [id number]     Facility: [facility]    PCP: Nyoka Cowden, MD  Code Status: full code  No Known Allergies  Chief Complaint  Patient presents with  . New Admit To SNF     HPI:  82 y.o. patient is here for short term rehabilitation post hospital admission from 08/02/15-08/04/15 for failed left total knee arthroplasty. She underwent left femoral revision. She has PMH of chronic diastolic CHF, HTN, coronary atherosclerosis, hiatal hernia, OA among others. She is seen in her room today. Her pain is under control with current pain regimen. She has muscle spasm. She had bowel movement this am.  Review of Systems:  Constitutional: Negative for fever, chills, diaphoresis.  HENT: Negative for headache, congestion, nasal discharge, difficulty swallowing.   Eyes: Negative for blurred vision, double vision and discharge.  Respiratory: Negative for cough, shortness of breath and wheezing.   Cardiovascular: Negative for chest pain, palpitations, leg swelling.  Gastrointestinal: Negative for heartburn, nausea, vomiting, abdominal pain Genitourinary: Negative for dysuria and flank pain.  Musculoskeletal: Negative for back pain, falls in the facility Skin: Negative for itching, rash.  Neurological: Negative for dizziness, tingling, focal weakness Psychiatric/Behavioral: Negative for depression   Past Medical History  Diagnosis Date  . CAD (coronary artery disease)     a. LHC 4/09: pLAD 40, mDx 70-75, pLCx 30, mLCx 50, mRCA 90, EF 60% >> PCI: BMS x2 to RCA;  b. Myoview 8/12: normal  . Essential hypertension   . Chronic diastolic CHF (congestive heart failure) (HCC)     a. Echo 912 - Mild LVH, EF 55-60%, no RWMA, Gr 1 DD, mild MR, mild LAE, mild RAE, PASP 59 mmHg (mod to severe pulmo HTN)  . History of TIA (transient ischemic attack)     a. Head CT 6/15: small chronic lacunar infarct in  thalamus  . Hyperlipidemia   . Depression   . COPD with chronic bronchitis ([place])   . GERD (gastroesophageal reflux disease)   . History of hiatal hernia   . History of DVT of lower extremity   . Sigmoid diverticulosis     s/p sigmoid colectomy  . Colovaginal fistula     s/p colostomy >> colostomy takedown 5/16  . History of adenomatous polyp of colon   . Renal insufficiency   . OA (osteoarthritis)   . Wears glasses   . Wears dentures   . Colostomy in place [facility])     s/p colostomy takedown 5/16  . Peripheral neuropathy ([place])   . Chronic anemia   . History of blood transfusion   . Allergy   . Anxiety   . Cataract     bil cateracts removed  . Pulmonary HTN ([place])     a. PASP on Echo in 9/12:  59 mmHg  . Dysrhythmia     A fib  . Stroke ([place])     TIAs  . Pneumonia   . Adenocarcinoma, colon [facility]) Oncologist-- Dr Truitt Merle    Multifocal (2) Colon cancer @ ileocecal valve and ascending ( mT4N1Mx), Stage IIIB, grade I, MMR normal , 2 of 16 +lymph nodes, negative surgical margins---  06-02-2014  Right hemicolectomy w/ colostomy   Past Surgical History  Procedure Laterality Date  . Appendectomy    . Cholecystectomy    . Abdominal hysterectomy    . Lumbar laminectomy  10-29-2002,  1969    Left  L3 -- 4  decompression  . Total knee arthroplasty Bilateral left 1994/  right 2000  . Rotator cuff repair Bilateral   . Cataract extraction w/ intraocular lens  implant, bilateral Bilateral   . Tonsillectomy    . Dilation and curettage of uterus    . Patellectomy Right 09/14/2013    Procedure: PATELLECTOMY;  Surgeon: Meredith Pel, MD;  Location: [place];  Service: Orthopedics;  Laterality: Right;  . Partial colectomy N/A 06/02/2014    Procedure: RIGHT PARTIAL COLECTOMY ;  Surgeon: Donnie Mesa, MD;  Location: [place];  Service: General;  Laterality: N/A;  . Colostomy N/A 06/02/2014    Procedure: DIVERTING DESCENDING END COLOSTOMY;  Surgeon: Donnie Mesa, MD;  Location: [place];  Service:  General;  Laterality: N/A;  . Revision total knee arthroplasty Bilateral right  04-02-2011/  left 1996 & 10-05-1999  . Anterior cervical decomp/discectomy fusion  01-31-2009    C5 -- 7  . Cardiovascular stress test  02-26-2011    Normal lexiscan no exercise study/  no ischemia/  normal LV function and wall motion, ef 67%  . Transthoracic echocardiogram  12-01-2009    Grade I diastolic dysfunction/  ef 60%/  moderate MR/  mild TR  . Evaluation under anesthesia with anal fistulectomy N/A 10/13/2014    Procedure: ANAL EXAM UNDER ANESTHESIA ;  Surgeon: [name], MD;  Location: [facility];  Service: General;  Laterality: N/A;  . Proctoscopy N/A 10/13/2014    Procedure: RIDGE PROCTOSCOPY;  Surgeon: [name], MD;  Location: [facility];  Service: General;  Laterality: N/A;  . Coronary angioplasty with stent placement  11-04-2007  dr [email or web address]    BMS x2 to RCA/  mild Non-obstructive disease LAD/  normal LVF  . Bone spurs Bilateral     Feet  . Hand surgery      Tendon repair  . Carpal tunnel release Right   . Laparoscopic sigmoid colectomy N/A 11/24/2014    Procedure: SIGMOID COLECTOMY AND COLSOTOMY CLOSURE;  Surgeon: Donnie Mesa, MD;  Location: [place];  Service: General;  Laterality: N/A;  . Total knee revision Left 08/02/2015    Procedure: LEFT FEMORAL REVISION;  Surgeon: Gaynelle Arabian, MD;  Location: [facility];  Service: Orthopedics;  Laterality: Left;   Social History:   reports that she quit smoking about 48 years ago. Her smoking use included Cigarettes. She has a 7.5 pack-year smoking history. She has never used smokeless tobacco. She reports that she does not drink alcohol or use illicit drugs.  Family History  Problem Relation Age of Onset  . Osteoarthritis Mother   . Heart failure Mother   . Cancer Sister 34    ? colon cancer   . Cancer Brother 30    Kidney cancer   . Cancer Maternal Uncle 31    Colon cancer   . Colon cancer Maternal Uncle   . Esophageal cancer Neg Hx   . Rectal  cancer Neg Hx   . Stomach cancer Neg Hx     Medications:   Medication List       This list is accurate as of: 08/08/15  1:00 PM.  Always use your most recent med list.               acetaminophen 325 MG tablet  Commonly known as:  TYLENOL  Take 650 mg by mouth every 6 (six) hours as needed for moderate pain.     apixaban 2.5 MG Tabs tablet  Commonly known as:  ELIQUIS  Take 1 tablet (2.5 mg total) by mouth every 12 (twelve) hours. Take Eliquis twice a day for two and a half more weeks, then discontinue Eliquis. Once the patient has completed the Xarelto, they [place] resume the 325 mg Aspirin.     benazepril 40 MG tablet  Commonly known as:  LOTENSIN  Take 40 mg by mouth daily.     diphenhydrAMINE 12.5 MG/5ML elixir  Commonly known as:  BENADRYL  Take 5-10 mLs (12.5-25 mg total) by mouth every 4 (four) hours as needed for itching.     docusate sodium 100 MG capsule  Commonly known as:  COLACE  Take 1 capsule (100 mg total) by mouth 2 (two) times daily.     esomeprazole 20 MG capsule  Commonly known as:  NEXIUM  Take 20 mg by mouth daily at 12 noon.     ferrous sulfate 325 (65 FE) MG tablet  Take 1 tablet (325 mg total) by mouth 2 (two) times daily with a meal.     furosemide 20 MG tablet  Commonly known as:  LASIX  Take 20 mg daily today and tomorrow then decrease to 20 mg daily on Mondays, Wednesdays and Fridays     gabapentin 600 MG tablet  Commonly known as:  NEURONTIN  Take 1 tablet by mouth 3 (three) times daily.     LORazepam 0.5 MG tablet  Commonly known as:  ATIVAN  Take 1 tablet (0.5 mg total) by mouth 2 (two) times daily.     methocarbamol 500 MG tablet  Commonly known as:  ROBAXIN  Take 1 tablet (500 mg total) by mouth every 6 (six) hours as needed for muscle spasms.     metoCLOPramide 5 MG tablet  Commonly known as:  REGLAN  Take 1 tablet (5 mg total) by mouth every 8 (eight) hours as needed for nausea (if ondansetron (ZOFRAN) ineffective.).      metoprolol 50 MG tablet  Commonly known as:  LOPRESSOR  Take 1.5 tablets (75 mg total) by mouth 2 (two) times daily.     nitroGLYCERIN 0.4 MG SL tablet  Commonly known as:  NITROSTAT  Place 0.4 mg under the tongue every 5 (five) minutes as needed for chest pain (x 3 pills). Reported on 07/25/2015     ondansetron 4 MG tablet  Commonly known as:  ZOFRAN  Take 1 tablet (4 mg total) by mouth every 6 (six) hours as needed for nausea.     oxyCODONE 5 MG immediate release tablet  Commonly known as:  Oxy IR/ROXICODONE  Take 1-2 tablets (5-10 mg total) by mouth every 3 (three) hours as needed for moderate pain or severe pain.     polyethylene glycol packet  Commonly known as:  MIRALAX / GLYCOLAX  Take 17 g by mouth daily as needed for mild constipation.     promethazine 12.5 MG tablet  Commonly known as:  PHENERGAN  Take 1 tablet by mouth every 6 (six) hours as needed for nausea or vomiting. Nausea/vomitting     sertraline 50 MG tablet  Commonly known as:  ZOLOFT  Take 1 tablet by mouth daily. Reported on 07/25/2015     sodium phosphate 7-19 GM/118ML Enem  Place 133 mLs (1 enema total) rectally once as needed for severe constipation.     spironolactone 25 MG tablet  Commonly known as:  ALDACTONE  Take 25 mg by mouth daily.     SYSTANE OP  Place 1 drop into both eyes 2 (two)  times daily as needed (for dry eyes).     traMADol 50 MG tablet  Commonly known as:  ULTRAM  Take 1-2 tablets (50-100 mg total) by mouth every 6 (six) hours as needed (mild pain).     traZODone 100 MG tablet  Commonly known as:  DESYREL  Take 50 mg by mouth at bedtime. Take one half tablet (50 mg) as needed at bedtime for sleep     triamcinolone cream 0.1 %  Commonly known as:  KENALOG  Apply 1 application topically 2 (two) times daily as needed (Eczema on legs). Reported on 07/25/2015         Physical Exam: Filed Vitals:   08/08/15 1259  BP: 132/83  Pulse: 95  Temp: 97.9 F (36.6 C)  Resp: 19    SpO2: 95%    General- elderly female, well built, in no acute distress Head- normocephalic, atraumatic Nose- no maxillary or frontal sinus tenderness, no nasal discharge Throat- moist mucus membrane  Eyes- PERRLA, EOMI, no pallor, no icterus, no discharge, normal conjunctiva, normal sclera Neck- no cervical lymphadenopathy Cardiovascular- normal s1,s2, no murmurs, palpable dorsalis pedis and radial pulses, 1+ left leg edema Respiratory- bilateral clear to auscultation, no wheeze, no rhonchi, no crackles, no use of accessory muscles Abdomen- bowel sounds present, soft, non tender Musculoskeletal- able to move all 4 extremities, left knee ROM limited, unsteady gait  Neurological- no focal deficit, alert and oriented to person, place and time Skin- warm and dry, left knee surgical incision with dressing in place and clean and dry, has another dressing to left lateral calf area clean and dry Psychiatry- normal mood and affect    Labs reviewed: Basic Metabolic Panel:  Recent Labs  07/28/15 1125 08/03/15 0430 08/04/15 0400  NA 142 138 137  K 4.7 5.2* 4.8  CL 104 104 104  CO2 '26 24 22  '$ GLUCOSE 101* 169* 133*  BUN 33* 45* 38*  CREATININE 1.61* 1.34* 1.37*  CALCIUM 9.5 8.8* 9.0   Liver Function Tests:  Recent Labs  01/24/15 0926 04/26/15 0807 07/28/15 1125  AST '24 29 31  '$ ALT '17 18 29  '$ ALKPHOS 65 79 71  BILITOT 0.32 0.52 0.6  PROT 7.1 7.7 8.2*  ALBUMIN 3.7 3.9 4.3   No results for input(s): LIPASE, AMYLASE in the last 8760 hours. No results for input(s): AMMONIA in the last 8760 hours. CBC:  Recent Labs  10/25/14 0953  01/24/15 0926 04/26/15 0807 07/28/15 1125 08/03/15 0430 08/04/15 0400  WBC 7.9  < > 6.8 6.8 8.7 10.0 11.8*  NEUTROABS 5.6  --  4.7 4.4  --   --   --   HGB 12.3  < > 11.1* 12.3 12.9 11.1* 10.4*  HCT 38.2  < > 33.5* 38.2 40.6 35.2* 32.1*  MCV 91.8  < > 89.0 90.1 94.2 93.6 90.9  PLT 168  < > 166 156 203 172 130*  < > = values in this interval  not displayed.   Assessment/Plan  Unsteady gait Post surgery, Will have patient work with PT/OT as tolerated to regain strength and restore function.  Fall precautions are in place.  Left knee failed arthroplasty S/p left femoral revision. Has f/u with orthopedics. WBAT. Continue eliquis for dvt prophylaxis. Change Robaxin 500 mg q6h prn muscle spasm to q8h prn and schedule 500 mg bid for now. Continue oxyIR 5 mg 1-2 q3h prn pain. Will have him work with physical therapy and occupational therapy team to help with gait training and muscle  strengthening exercises.fall precautions. Skin care. Encourage to be out of bed.   Blood loss anemia Post op, monitor cbc. Continue feso4 bid for now  Leukocytosis Afebrile, has open wound to left calf with no signs of infection, monitor cbc with diff  HTN Stable, continue benazepril 40 mg daily, metoprolol 75 mg bid, lasix 20 mg three days a week and aldactone 25 mg daily, monitor BP daily  Constipation Continue colace 100 mg bid and prn miralax  CAD Remains chest pain free. Continue lopressor 75 mg bid and NTG prn   Chronic diastolic chf Continue b blocker, ACEI, aldactone and lasix, monitor weight for now and check bmp  gerd Stable on nexium, continue this   Goals of care: short term rehabilitation   Labs/tests ordered: cbc with diff, cmp  Family/ staff Communication: reviewed care plan with patient and nursing supervisor    Blanchie Serve, MD  [facility] Adult [facility] [phone] (Monday-Friday 8 am - 5 pm) [phone] (afterhours)

## 2015-08-09 ENCOUNTER — Encounter (HOSPITAL_COMMUNITY): Payer: Self-pay | Admitting: Orthopedic Surgery

## 2015-08-09 LAB — BASIC METABOLIC PANEL
BUN: 42 mg/dL — AB (ref 4–21)
Creatinine: 1.8 mg/dL — AB (ref 0.5–1.1)
Glucose: 88 mg/dL
Potassium: 4.6 mmol/L (ref 3.4–5.3)
Sodium: 137 mmol/L (ref 137–147)

## 2015-08-09 LAB — CBC AND DIFFERENTIAL
HCT: 33 % — AB (ref 36–46)
Hemoglobin: 10.2 g/dL — AB (ref 12.0–16.0)
Platelets: 184 10*3/uL (ref 150–399)
WBC: 6.9 10^3/mL

## 2015-08-09 LAB — HEPATIC FUNCTION PANEL
ALT: 17 U/L (ref 7–35)
AST: 20 U/L (ref 13–35)
Alkaline Phosphatase: 64 U/L (ref 25–125)
Bilirubin, Total: 0.6 mg/dL

## 2015-08-10 ENCOUNTER — Other Ambulatory Visit: Payer: Self-pay

## 2015-08-10 ENCOUNTER — Non-Acute Institutional Stay (SKILLED_NURSING_FACILITY): Payer: Commercial Managed Care - HMO | Admitting: Family

## 2015-08-10 DIAGNOSIS — L03119 Cellulitis of unspecified part of limb: Secondary | ICD-10-CM

## 2015-08-10 DIAGNOSIS — L02419 Cutaneous abscess of limb, unspecified: Secondary | ICD-10-CM

## 2015-08-10 DIAGNOSIS — I959 Hypotension, unspecified: Secondary | ICD-10-CM | POA: Diagnosis not present

## 2015-08-10 DIAGNOSIS — L0390 Cellulitis, unspecified: Secondary | ICD-10-CM | POA: Insufficient documentation

## 2015-08-10 MED ORDER — OXYCODONE HCL 5 MG PO TABS: 5.0000 mg | ORAL_TABLET | ORAL | Status: DC | PRN

## 2015-08-10 NOTE — Progress Notes (Signed)
Patient ID: [name], female   DOB: 1930-06-03, 82 y.o.   MRN: [id number]  Location: [facility]  Provider: Blanchie Serve   Nyoka Cowden, MD  Code Status:  Full Code  Goals of care: Advanced Directive information Advanced Directives 08/02/2015  Does patient have an advance directive? Yes  Type of Paramedic of [place];Living will  Does patient want to make changes to advanced directive? No - Patient declined  Copy of advanced directive(s) in chart? No - copy requested     Chief Complaint  Patient presents with   Acute Visit    HPI:  Pt is a 82 y.o. female seen today at [facility] to address acute issues per patient's daughter and facility request.She is  post hospital admission from 08/02/15-08/04/15 for failed left total knee arthroplasty. She underwent left femoral revision. She has PMH of chronic diastolic CHF, HTN, coronary atherosclerosis, hiatal hernia, OA and among others. She is seen in her room today. Patient's daughter requested visit to evaluate left knee redness and swelling.Patient states her pain is under control with current pain regimen but knee feels warm to touch. Facility also reports low B/P readings ranging from 70/30 to 80/40's. B/P medications held previous night and this morning prior to visit. B/p 104/60 today. She reports feeling dizzy and weak yesterday but much better today. Denies any chest pain, SOB, nausea, vomiting, fever or chills. Appetite has been good.   Review of Systems  Constitutional: Negative for fever, chills, malaise/fatigue and diaphoresis.  HENT: Negative.   Eyes: Negative.   Respiratory: Negative.   Cardiovascular: Negative.   Gastrointestinal: Negative.   Genitourinary: Negative.   Musculoskeletal: Negative for falls.  Skin: Negative.   Neurological: Negative.   Endo/Heme/Allergies: Negative.   Psychiatric/Behavioral: Negative.     Past  Medical History  Diagnosis Date   CAD (coronary artery disease)     a. LHC 4/09: pLAD 40, mDx 70-75, pLCx 30, mLCx 50, mRCA 90, EF 60% >> PCI: BMS x2 to RCA;  b. Myoview 8/12: normal   Essential hypertension    Chronic diastolic CHF (congestive heart failure) (HCC)     a. Echo 912 - Mild LVH, EF 55-60%, no RWMA, Gr 1 DD, mild MR, mild LAE, mild RAE, PASP 59 mmHg (mod to severe pulmo HTN)   History of TIA (transient ischemic attack)     a. Head CT 6/15: small chronic lacunar infarct in thalamus   Hyperlipidemia    Depression    COPD with chronic bronchitis (HCC)    GERD (gastroesophageal reflux disease)    History of hiatal hernia    History of DVT of lower extremity    Sigmoid diverticulosis     s/p sigmoid colectomy   Colovaginal fistula     s/p colostomy >> colostomy takedown 5/16   History of adenomatous polyp of colon    Renal insufficiency    OA (osteoarthritis)    Wears glasses    Wears dentures    Colostomy in place [facility])     s/p colostomy takedown 5/16   Peripheral neuropathy (HCC)    Chronic anemia    History of blood transfusion    Allergy    Anxiety    Cataract     bil cateracts removed   Pulmonary HTN ([place])     a. PASP on Echo in 9/12:  59 mmHg   Dysrhythmia     A fib   Stroke ([place])  TIAs   Pneumonia    Adenocarcinoma, colon ([place]) Oncologist-- Dr Truitt Merle    Multifocal (2) Colon cancer @ ileocecal valve and ascending ( [id number]), Stage IIIB, grade I, MMR normal , 2 of 16 +lymph nodes, negative surgical margins---  06-02-2014  Right hemicolectomy w/ colostomy   Past Surgical History  Procedure Laterality Date   Appendectomy     Cholecystectomy     Abdominal hysterectomy     Lumbar laminectomy  10-29-2002,  1969    Left  L3 -- 4  decompression   Total knee arthroplasty Bilateral left 1994/  right 2000   Rotator cuff repair Bilateral    Cataract extraction w/ intraocular lens  implant, bilateral Bilateral     Tonsillectomy     Dilation and curettage of uterus     Patellectomy Right 09/14/2013    Procedure: PATELLECTOMY;  Surgeon: Meredith Pel, MD;  Location: [place];  Service: Orthopedics;  Laterality: Right;   Partial colectomy N/A 06/02/2014    Procedure: RIGHT PARTIAL COLECTOMY ;  Surgeon: Donnie Mesa, MD;  Location: [place];  Service: General;  Laterality: N/A;   Colostomy N/A 06/02/2014    Procedure: DIVERTING DESCENDING END COLOSTOMY;  Surgeon: Donnie Mesa, MD;  Location: [place];  Service: General;  Laterality: N/A;   Revision total knee arthroplasty Bilateral right  04-02-2011/  left 1996 & 10-05-1999   Anterior cervical decomp/discectomy fusion  01-31-2009    C5 -- 7   Cardiovascular stress test  02-26-2011    Normal lexiscan no exercise study/  no ischemia/  normal LV function and wall motion, ef 67%   Transthoracic echocardiogram  12-01-2009    Grade I diastolic dysfunction/  ef 60%/  moderate MR/  mild TR   Evaluation under anesthesia with anal fistulectomy N/A 10/13/2014    Procedure: ANAL EXAM UNDER ANESTHESIA ;  Surgeon: [name], MD;  Location: [facility];  Service: General;  Laterality: N/A;   Proctoscopy N/A 10/13/2014    Procedure: RIDGE PROCTOSCOPY;  Surgeon: [name], MD;  Location: [facility];  Service: General;  Laterality: N/A;   Coronary angioplasty with stent placement  11-04-2007  dr [email or web address]    BMS x2 to RCA/  mild Non-obstructive disease LAD/  normal LVF   Bone spurs Bilateral     Feet   Hand surgery      Tendon repair   Carpal tunnel release Right    Laparoscopic sigmoid colectomy N/A 11/24/2014    Procedure: SIGMOID COLECTOMY AND COLSOTOMY CLOSURE;  Surgeon: Donnie Mesa, MD;  Location: [place];  Service: General;  Laterality: N/A;   Total knee revision Left 08/02/2015    Procedure: LEFT FEMORAL REVISION;  Surgeon: Gaynelle Arabian, MD;  Location: [facility];  Service: Orthopedics;  Laterality: Left;    No Known Allergies    Medication List        This list is accurate as of: 08/10/15  5:45 PM.  Always use your most recent med list.               acetaminophen 325 MG tablet  Commonly known as:  TYLENOL  Take 650 mg by mouth every 6 (six) hours as needed for moderate pain.     apixaban 2.5 MG Tabs tablet  Commonly known as:  ELIQUIS  Take 1 tablet (2.5 mg total) by mouth every 12 (twelve) hours. Take Eliquis twice a day for two and a half more weeks, then discontinue Eliquis. Once the patient has completed the Xarelto, they  [place] resume the 325 mg Aspirin.     benazepril 40 MG tablet  Commonly known as:  LOTENSIN  Take 40 mg by mouth daily.     diphenhydrAMINE 12.5 MG/5ML elixir  Commonly known as:  BENADRYL  Take 5-10 mLs (12.5-25 mg total) by mouth every 4 (four) hours as needed for itching.     docusate sodium 100 MG capsule  Commonly known as:  COLACE  Take 1 capsule (100 mg total) by mouth 2 (two) times daily.     esomeprazole 20 MG capsule  Commonly known as:  NEXIUM  Take 20 mg by mouth daily at 12 noon.     ferrous sulfate 325 (65 FE) MG tablet  Take 1 tablet (325 mg total) by mouth 2 (two) times daily with a meal.     furosemide 20 MG tablet  Commonly known as:  LASIX  Take 20 mg daily today and tomorrow then decrease to 20 mg daily on Mondays, Wednesdays and Fridays     gabapentin 600 MG tablet  Commonly known as:  NEURONTIN  Take 1 tablet by mouth 3 (three) times daily.     LORazepam 0.5 MG tablet  Commonly known as:  ATIVAN  Take 1 tablet (0.5 mg total) by mouth 2 (two) times daily.     methocarbamol 500 MG tablet  Commonly known as:  ROBAXIN  Take 1 tablet (500 mg total) by mouth every 6 (six) hours as needed for muscle spasms.     metoCLOPramide 5 MG tablet  Commonly known as:  REGLAN  Take 1 tablet (5 mg total) by mouth every 8 (eight) hours as needed for nausea (if ondansetron (ZOFRAN) ineffective.).     metoprolol 50 MG tablet  Commonly known as:  LOPRESSOR  Take 1.5 tablets (75 mg total)  by mouth 2 (two) times daily.     nitroGLYCERIN 0.4 MG SL tablet  Commonly known as:  NITROSTAT  Place 0.4 mg under the tongue every 5 (five) minutes as needed for chest pain (x 3 pills). Reported on 07/25/2015     ondansetron 4 MG tablet  Commonly known as:  ZOFRAN  Take 1 tablet (4 mg total) by mouth every 6 (six) hours as needed for nausea.     oxyCODONE 5 MG immediate release tablet  Commonly known as:  Oxy IR/ROXICODONE  Take 1-2 tablets (5-10 mg total) by mouth every 3 (three) hours as needed for moderate pain or severe pain.     polyethylene glycol packet  Commonly known as:  MIRALAX / GLYCOLAX  Take 17 g by mouth daily as needed for mild constipation.     promethazine 12.5 MG tablet  Commonly known as:  PHENERGAN  Take 1 tablet by mouth every 6 (six) hours as needed for nausea or vomiting. Nausea/vomitting     sertraline 50 MG tablet  Commonly known as:  ZOLOFT  Take 1 tablet by mouth daily. Reported on 07/25/2015     sodium phosphate 7-19 GM/118ML Enem  Place 133 mLs (1 enema total) rectally once as needed for severe constipation.     spironolactone 25 MG tablet  Commonly known as:  ALDACTONE  Take 25 mg by mouth daily.     SYSTANE OP  Place 1 drop into both eyes 2 (two) times daily as needed (for dry eyes).     traMADol 50 MG tablet  Commonly known as:  ULTRAM  Take 1-2 tablets (50-100 mg total) by mouth every 6 (six) hours as needed (mild  pain).     traZODone 100 MG tablet  Commonly known as:  DESYREL  Take 50 mg by mouth at bedtime. Take one half tablet (50 mg) as needed at bedtime for sleep     triamcinolone cream 0.1 %  Commonly known as:  KENALOG  Apply 1 application topically 2 (two) times daily as needed (Eczema on legs). Reported on 07/25/2015        Immunization History  Administered Date(s) Administered   Influenza Split 06/21/2011, 04/21/2012   Influenza Whole 05/19/2007, 04/05/2008, 04/26/2010   Influenza, High Dose Seasonal PF 05/17/2015     Influenza,inj,Quad PF,36+ Mos 03/10/2013   Influenza-Unspecified 04/14/2014   PPD Test 06/10/2014, 06/24/2014   Pneumococcal Conjugate-13 05/17/2015   Pneumococcal Polysaccharide-23 02/10/2013   Tdap 11/19/2010   Pertinent  Health Maintenance Due  Topic Date Due   DEXA SCAN  03/11/1997   INFLUENZA VACCINE  02/13/2016   PNA vac Low Risk Adult  Completed   Fall Risk  11/15/2014 08/25/2014 02/10/2013  Falls in the past year? No Yes Yes  Number falls in past yr: - 2 or more 2 or more  Injury with Fall? - Yes -  Risk Factor Category  - High Fall Risk High Fall Risk  Risk for fall due to : - Impaired balance/gait Impaired balance/gait    Filed Vitals:   08/10/15 1739  BP: 104/60  Pulse: 77  Temp: 98 F (36.7 C)  Resp: 18  Weight: 183 lb (83.008 kg)  SpO2: 97%   Body mass index is 31.9 kg/(m^2). Physical Exam  Constitutional: She appears well-developed and well-nourished. No distress.  HENT:  Head: Normocephalic.  Eyes: EOM are normal. Pupils are equal, round, and reactive to light. No scleral icterus.  Neck: Normal range of motion.  Cardiovascular: Normal rate and regular rhythm.  Exam reveals no gallop and no friction rub.   No murmur heard. Pulmonary/Chest: Effort normal and breath sounds normal.  Abdominal: Soft. Bowel sounds are normal. She exhibits no distension. There is no tenderness.  Musculoskeletal: She exhibits no edema.  Normal ROM except limited on Left knee due to pain.   Skin: Skin is warm and dry.  Left inner knee red, swollen and warm to touch. Surgical DRSG Dry clean and intact.   Psychiatric: She has a normal mood and affect.    Labs reviewed:  Recent Labs  07/28/15 1125 08/03/15 0430 08/04/15 0400  NA 142 138 137  K 4.7 5.2* 4.8  CL 104 104 104  CO2 _0 GLUCOSE 101* 169* 133*  BUN 33* 45* 38*  CREATININE 1.61* 1.34* 1.37*  CALCIUM 9.5 8.8* 9.0    Recent Labs  01/24/15 0926 04/26/15 0807 07/28/15 1125  AST _1 ALT _2 ALKPHOS 65 79 71  BILITOT 0.32 0.52 0.6  PROT 7.1 7.7 8.2*  ALBUMIN 3.7 3.9 4.3    Recent Labs  10/25/14 0953  01/24/15 0926 04/26/15 0807 07/28/15 1125 08/03/15 0430 08/04/15 0400  WBC 7.9  < > 6.8 6.8 8.7 10.0 11.8*  NEUTROABS 5.6  --  4.7 4.4  --   --   --   HGB 12.3  < > 11.1* 12.3 12.9 11.1* 10.4*  HCT 38.2  < > 33.5* 38.2 40.6 35.2* 32.1*  MCV 91.8  < > 89.0 90.1 94.2 93.6 90.9  PLT 168  < > 166 156 203 172 130*  < > = values in this interval not displayed. Lab Results  Component Value Date   TSH 4.160 05/31/2014   Lab Results  Component Value Date   HGBA1C 5.9* 05/31/2014   Lab Results  Component Value Date   CHOL 130 01/19/2013   HDL 33.60* 01/19/2013   LDLCALC 79 01/19/2013   TRIG 87.0 01/19/2013   CHOLHDL 4 01/19/2013    Significant Diagnostic Results in last 30 days:  No results found.  Assessment/Plan 1. Hypotension, unspecified B/p 70/30's to 80/40's with dizziness reported. B/p meds held today with b/p 104/60. Will discontinue Benazepril and Spironolactone. Continue with Metoprolol and furosemide but hold if B/P < 110 . Monitor B/p Bid X 1 week. Will restart meds gradually if B/P > 140/90.   2. Cellulitis Left Knee She is post hospital admission from 08/02/15-08/04/15 for failed left total knee arthroplasty. She underwent left femoral revision.Left inner knee red, swollen and warm to touch. No drainage noted.Will start Doxycycline 100 mg Tablet Bid X 7days and Florastor 250 mg Capsule Bid X 10 days. Follow up appointment with [facility] as scheduled for 08/14/2014. Continue to monitor.     Family/ staff Communication: Reviewed plan with patient and facility staff.   Labs/tests ordered: None.

## 2015-08-14 LAB — BASIC METABOLIC PANEL
BUN: 24 mg/dL — AB (ref 4–21)
Creatinine: 1.1 mg/dL (ref 0.5–1.1)
Glucose: 11 mg/dL
Potassium: 4.9 mmol/L (ref 3.4–5.3)
Sodium: 141 mmol/L (ref 137–147)

## 2015-08-15 LAB — BASIC METABOLIC PANEL
BUN: 22 mg/dL — AB (ref 4–21)
Creatinine: 1.2 mg/dL — AB (ref 0.5–1.1)
Glucose: 90 mg/dL
Potassium: 4.6 mmol/L (ref 3.4–5.3)
Sodium: 138 mmol/L (ref 137–147)

## 2015-08-16 ENCOUNTER — Other Ambulatory Visit: Payer: Self-pay | Admitting: *Deleted

## 2015-08-16 MED ORDER — OXYCODONE HCL 5 MG PO TABS: 5.0000 mg | ORAL_TABLET | ORAL | Status: DC | PRN

## 2015-08-16 NOTE — Telephone Encounter (Signed)
[facility] 

## 2015-08-18 ENCOUNTER — Other Ambulatory Visit: Payer: Commercial Managed Care - HMO

## 2015-08-18 ENCOUNTER — Ambulatory Visit: Payer: Commercial Managed Care - HMO | Admitting: Hematology

## 2015-08-21 ENCOUNTER — Encounter: Payer: Self-pay | Admitting: Nurse Practitioner

## 2015-08-21 ENCOUNTER — Non-Acute Institutional Stay: Payer: Commercial Managed Care - HMO | Admitting: Nurse Practitioner

## 2015-08-21 DIAGNOSIS — F418 Other specified anxiety disorders: Secondary | ICD-10-CM | POA: Diagnosis not present

## 2015-08-21 DIAGNOSIS — L03119 Cellulitis of unspecified part of limb: Secondary | ICD-10-CM | POA: Diagnosis not present

## 2015-08-21 DIAGNOSIS — F32A Depression, unspecified: Secondary | ICD-10-CM

## 2015-08-21 DIAGNOSIS — I10 Essential (primary) hypertension: Secondary | ICD-10-CM

## 2015-08-21 DIAGNOSIS — L02419 Cutaneous abscess of limb, unspecified: Secondary | ICD-10-CM

## 2015-08-21 DIAGNOSIS — I2510 Atherosclerotic heart disease of native coronary artery without angina pectoris: Secondary | ICD-10-CM | POA: Diagnosis not present

## 2015-08-21 DIAGNOSIS — F419 Anxiety disorder, unspecified: Secondary | ICD-10-CM

## 2015-08-21 DIAGNOSIS — I5032 Chronic diastolic (congestive) heart failure: Secondary | ICD-10-CM | POA: Diagnosis not present

## 2015-08-21 DIAGNOSIS — D62 Acute posthemorrhagic anemia: Secondary | ICD-10-CM

## 2015-08-21 DIAGNOSIS — T84093D Other mechanical complication of internal left knee prosthesis, subsequent encounter: Secondary | ICD-10-CM

## 2015-08-21 DIAGNOSIS — F329 Major depressive disorder, single episode, unspecified: Secondary | ICD-10-CM

## 2015-08-21 NOTE — Progress Notes (Signed)
Patient ID: Matylda Fehring Pitner, female   DOB: 05-13-1930, 82 y.o.   MRN: [id number]    Nursing Home Location:  [place] of Service: SNF (31)  PCP: Nyoka Cowden, MD  No Known Allergies  Chief Complaint  Patient presents with  . Discharge Note    Discharge from facility    HPI:  Patient is a 82 y.o. female seen today at [facility] for discharge home. She has PMH of chronic diastolic CHF, HTN, coronary atherosclerosis, hiatal hernia, OA. Pt is here for short term rehabilitation after hospitalization from 08/02/15-08/04/15 for failed left total knee arthroplasty. She underwent left femoral revision. Her pain is under control with current pain regimen. Since pt has been at [facility] was treated with doxycyline due to cellulitis of left knee with improvement. Pt also with hypotension. Benazepril and spironolactone stopped.  Blood pressure has improved at this time. Patient currently doing well with therapy, now stable to discharge home with home health.   Review of Systems:  Review of Systems  Constitutional: Negative for activity change, appetite change, fatigue and unexpected weight change.  HENT: Negative for congestion and hearing loss.   Eyes: Negative.   Respiratory: Negative for cough and shortness of breath.   Cardiovascular: Negative for chest pain, palpitations and leg swelling.  Gastrointestinal: Negative for abdominal pain, diarrhea and constipation.  Genitourinary: Negative for dysuria and difficulty urinating.  Musculoskeletal: Negative for myalgias and arthralgias.       Pain controlled on current regimen   Skin: Negative for color change and wound.  Neurological: Negative for dizziness and weakness.  Psychiatric/Behavioral: Negative for behavioral problems, confusion and agitation.    Past Medical History  Diagnosis Date  . CAD (coronary artery disease)     a. LHC 4/09: pLAD 40, mDx 70-75, pLCx 30, mLCx 50, mRCA  90, EF 60% >> PCI: BMS x2 to RCA;  b. Myoview 8/12: normal  . Essential hypertension   . Chronic diastolic CHF (congestive heart failure) (HCC)     a. Echo 912 - Mild LVH, EF 55-60%, no RWMA, Gr 1 DD, mild MR, mild LAE, mild RAE, PASP 59 mmHg (mod to severe pulmo HTN)  . History of TIA (transient ischemic attack)     a. Head CT 6/15: small chronic lacunar infarct in thalamus  . Hyperlipidemia   . Depression   . COPD with chronic bronchitis ([place])   . GERD (gastroesophageal reflux disease)   . History of hiatal hernia   . History of DVT of lower extremity   . Sigmoid diverticulosis     s/p sigmoid colectomy  . Colovaginal fistula     s/p colostomy >> colostomy takedown 5/16  . History of adenomatous polyp of colon   . Renal insufficiency   . OA (osteoarthritis)   . Wears glasses   . Wears dentures   . Colostomy in place [facility])     s/p colostomy takedown 5/16  . Peripheral neuropathy ([place])   . Chronic anemia   . History of blood transfusion   . Allergy   . Anxiety   . Cataract     bil cateracts removed  . Pulmonary HTN ([place])     a. PASP on Echo in 9/12:  59 mmHg  . Dysrhythmia     A fib  . Stroke ([place])     TIAs  . Pneumonia   . Adenocarcinoma, colon [facility]) Oncologist-- Dr Truitt Merle    Multifocal (  2) Colon cancer @ ileocecal valve and ascending ( [id number]), Stage IIIB, grade I, MMR normal , 2 of 16 +lymph nodes, negative surgical margins---  06-02-2014  Right hemicolectomy w/ colostomy   Past Surgical History  Procedure Laterality Date  . Appendectomy    . Cholecystectomy    . Abdominal hysterectomy    . Lumbar laminectomy  10-29-2002,  1969    Left  L3 -- 4  decompression  . Total knee arthroplasty Bilateral left 1994/  right 2000  . Rotator cuff repair Bilateral   . Cataract extraction w/ intraocular lens  implant, bilateral Bilateral   . Tonsillectomy    . Dilation and curettage of uterus    . Patellectomy Right 09/14/2013    Procedure: PATELLECTOMY;  Surgeon: Meredith Pel, MD;  Location: [place];  Service: Orthopedics;  Laterality: Right;  . Partial colectomy N/A 06/02/2014    Procedure: RIGHT PARTIAL COLECTOMY ;  Surgeon: Donnie Mesa, MD;  Location: [place];  Service: General;  Laterality: N/A;  . Colostomy N/A 06/02/2014    Procedure: DIVERTING DESCENDING END COLOSTOMY;  Surgeon: Donnie Mesa, MD;  Location: [place];  Service: General;  Laterality: N/A;  . Revision total knee arthroplasty Bilateral right  04-02-2011/  left 1996 & 10-05-1999  . Anterior cervical decomp/discectomy fusion  01-31-2009    C5 -- 7  . Cardiovascular stress test  02-26-2011    Normal lexiscan no exercise study/  no ischemia/  normal LV function and wall motion, ef 67%  . Transthoracic echocardiogram  12-01-2009    Grade I diastolic dysfunction/  ef 60%/  moderate MR/  mild TR  . Evaluation under anesthesia with anal fistulectomy N/A 10/13/2014    Procedure: ANAL EXAM UNDER ANESTHESIA ;  Surgeon: [name], MD;  Location: [facility];  Service: General;  Laterality: N/A;  . Proctoscopy N/A 10/13/2014    Procedure: RIDGE PROCTOSCOPY;  Surgeon: [name], MD;  Location: [facility];  Service: General;  Laterality: N/A;  . Coronary angioplasty with stent placement  11-04-2007  dr [email or web address]    BMS x2 to RCA/  mild Non-obstructive disease LAD/  normal LVF  . Bone spurs Bilateral     Feet  . Hand surgery      Tendon repair  . Carpal tunnel release Right   . Laparoscopic sigmoid colectomy N/A 11/24/2014    Procedure: SIGMOID COLECTOMY AND COLSOTOMY CLOSURE;  Surgeon: Donnie Mesa, MD;  Location: [place];  Service: General;  Laterality: N/A;  . Total knee revision Left 08/02/2015    Procedure: LEFT FEMORAL REVISION;  Surgeon: Gaynelle Arabian, MD;  Location: [facility];  Service: Orthopedics;  Laterality: Left;   Social History:   reports that she quit smoking about 48 years ago. Her smoking use included Cigarettes. She has a 7.5 pack-year smoking history. She has never used smokeless  tobacco. She reports that she does not drink alcohol or use illicit drugs.  Family History  Problem Relation Age of Onset  . Osteoarthritis Mother   . Heart failure Mother   . Cancer Sister 67    ? colon cancer   . Cancer Brother 30    Kidney cancer   . Cancer Maternal Uncle 33    Colon cancer   . Colon cancer Maternal Uncle   . Esophageal cancer Neg Hx   . Rectal cancer Neg Hx   . Stomach cancer Neg Hx     Medications: Patient's Medications  New Prescriptions   No medications on file  Previous Medications   ACETAMINOPHEN (TYLENOL) 325 MG TABLET    Take 650 mg by mouth every 6 (six) hours as needed for moderate pain.    APIXABAN (ELIQUIS) 2.5 MG TABS TABLET    Take 1 tablet (2.5 mg total) by mouth every 12 (twelve) hours. Take Eliquis twice a day for two and a half more weeks, then discontinue Eliquis. Once the patient has completed the Xarelto, they [place] resume the 325 mg Aspirin.   ASPIRIN 325 MG EC TABLET    Take 325 mg by mouth daily.   DIPHENHYDRAMINE (BENADRYL) 12.5 MG/5ML ELIXIR    Take 5-10 mLs (12.5-25 mg total) by mouth every 4 (four) hours as needed for itching.   DOCUSATE SODIUM (COLACE) 100 MG CAPSULE    Take 1 capsule (100 mg total) by mouth 2 (two) times daily.   ESOMEPRAZOLE (NEXIUM) 20 MG CAPSULE    Take 20 mg by mouth daily at 12 noon.   FERROUS SULFATE 325 (65 FE) MG TABLET    Take 1 tablet (325 mg total) by mouth 2 (two) times daily with a meal.   FUROSEMIDE (LASIX) 20 MG TABLET    Take 20 mg daily today and tomorrow then decrease to 20 mg daily on Mondays, Wednesdays and Fridays   GABAPENTIN (NEURONTIN) 600 MG TABLET    Take 1 tablet by mouth 3 (three) times daily.   LORAZEPAM (ATIVAN) 0.5 MG TABLET    Take 1 tablet (0.5 mg total) by mouth 2 (two) times daily.   METHOCARBAMOL (ROBAXIN) 500 MG TABLET    Take 1 tablet (500 mg total) by mouth every 6 (six) hours as needed for muscle spasms.   METOCLOPRAMIDE (REGLAN) 5 MG TABLET    Take 1 tablet (5 mg total) by  mouth every 8 (eight) hours as needed for nausea (if ondansetron (ZOFRAN) ineffective.).   METOPROLOL (LOPRESSOR) 50 MG TABLET    Take 1.5 tablets (75 mg total) by mouth 2 (two) times daily.   NITROGLYCERIN (NITROSTAT) 0.4 MG SL TABLET    Place 0.4 mg under the tongue every 5 (five) minutes as needed for chest pain (x 3 pills). Reported on 07/25/2015   ONDANSETRON (ZOFRAN) 4 MG TABLET    Take 1 tablet (4 mg total) by mouth every 6 (six) hours as needed for nausea.   OXYCODONE (OXY IR/ROXICODONE) 5 MG IMMEDIATE RELEASE TABLET    Take 1-2 tablets (5-10 mg total) by mouth every 3 (three) hours as needed for moderate pain or severe pain.   POLYETHYL GLYCOL-PROPYL GLYCOL (SYSTANE OP)    Place 1 drop into both eyes 2 (two) times daily as needed (for dry eyes).   POLYETHYLENE GLYCOL (MIRALAX / GLYCOLAX) PACKET    Take 17 g by mouth daily as needed for mild constipation.   PROMETHAZINE (PHENERGAN) 12.5 MG TABLET    Take 1 tablet by mouth every 6 (six) hours as needed for nausea or vomiting. Nausea/vomitting   SERTRALINE (ZOLOFT) 50 MG TABLET    Take 1 tablet by mouth daily. Reported on 07/25/2015   SODIUM PHOSPHATE (FLEET) 7-19 GM/118ML ENEM    Place 133 mLs (1 enema total) rectally once as needed for severe constipation.   TRAMADOL (ULTRAM) 50 MG TABLET    Take 1-2 tablets (50-100 mg total) by mouth every 6 (six) hours as needed (mild pain).   TRAZODONE (DESYREL) 100 MG TABLET    Take 50 mg by mouth at bedtime. Take one half tablet (50 mg) as needed at bedtime for  sleep   TRIAMCINOLONE CREAM (KENALOG) 0.1 %    Apply 1 application topically 2 (two) times daily as needed (Eczema on legs). Reported on 07/25/2015  Modified Medications   No medications on file  Discontinued Medications   BENAZEPRIL (LOTENSIN) 40 MG TABLET    Take 40 mg by mouth daily.   SPIRONOLACTONE (ALDACTONE) 25 MG TABLET    Take 25 mg by mouth daily.     Physical Exam: Filed Vitals:   08/21/15 1211  BP: 124/84  Pulse: 74  Temp: 98.5  F (36.9 C)  Resp: 18  Height: '5\' 3"'$  (1.6 m)  Weight: 181 lb 9.6 oz (82.373 kg)    Physical Exam  Constitutional: She appears well-developed and well-nourished. No distress.  HENT:  Head: Normocephalic.  Eyes: EOM are normal. Pupils are equal, round, and reactive to light. No scleral icterus.  Neck: Normal range of motion.  Cardiovascular: Normal rate and regular rhythm.  Exam reveals no gallop and no friction rub.   No murmur heard. Pulmonary/Chest: Effort normal and breath sounds normal.  Abdominal: Soft. Bowel sounds are normal. She exhibits no distension. There is no tenderness.  Musculoskeletal: She exhibits no edema.  Skin: Skin is warm and dry.  Left knee with mild erythema and swelling. Limited ROM after surgery. Incision healing well.   Psychiatric: She has a normal mood and affect.    Labs reviewed: Basic Metabolic Panel:  Recent Labs  07/28/15 1125 08/03/15 0430 08/04/15 0400 08/09/15 08/14/15 08/15/15  NA 142 138 137 137 141 138  K 4.7 5.2* 4.8 4.6 4.9 4.6  CL 104 104 104  --   --   --   CO2 '26 24 22  '$ --   --   --   GLUCOSE 101* 169* 133*  --   --   --   BUN 33* 45* 38* 42* 24* 22*  CREATININE 1.61* 1.34* 1.37* 1.8* 1.1 1.2*  CALCIUM 9.5 8.8* 9.0  --   --   --    Liver Function Tests:  Recent Labs  01/24/15 0926 04/26/15 0807 07/28/15 1125 08/09/15  AST '24 29 31 20  '$ ALT '17 18 29 17  '$ ALKPHOS 65 79 71 64  BILITOT 0.32 0.52 0.6  --   PROT 7.1 7.7 8.2*  --   ALBUMIN 3.7 3.9 4.3  --    No results for input(s): LIPASE, AMYLASE in the last 8760 hours. No results for input(s): AMMONIA in the last 8760 hours. CBC:  Recent Labs  10/25/14 0953  01/24/15 0926 04/26/15 0807 07/28/15 1125 08/03/15 0430 08/04/15 0400 08/09/15  WBC 7.9  < > 6.8 6.8 8.7 10.0 11.8* 6.9  NEUTROABS 5.6  --  4.7 4.4  --   --   --   --   HGB 12.3  < > 11.1* 12.3 12.9 11.1* 10.4* 10.2*  HCT 38.2  < > 33.5* 38.2 40.6 35.2* 32.1* 33*  MCV 91.8  < > 89.0 90.1 94.2 93.6 90.9  --    PLT 168  < > 166 156 203 172 130* 184  < > = values in this interval not displayed. TSH: No results for input(s): TSH in the last 8760 hours. A1C: Lab Results  Component Value Date   HGBA1C 5.9* 05/31/2014   Lipid Panel: No results for input(s): CHOL, HDL, LDLCALC, TRIG, CHOLHDL, LDLDIRECT in the last 8760 hours.   Assessment/Plan 1. Failed total left knee replacement, subsequent encounter S/p left femoral revision. WBAT. Pain controlled on current regimen.  Continue eliquis for dvt prophylaxis. Cont on Robaxin for muscle spasm and oxyIR 5 mg 1-2 q3h prn pain.  -cont ortho follow up.   2. Acute blood loss anemia Post-op, stable, to follow up with PCP for ongoing monitoring. Cont iron   3. Cellulitis and abscess of leg Improved with doxycyline. Educated to monitor knee and notify if swelling, redness or tenderness worsens. Also if warmth occurs  4. DIASTOLIC HEART FAILURE, CHRONIC Stable, euvolemic, conts on betablocker and lasix (ACEI and aldactone stopped due to hypotension)  5. Essential hypertension Blood pressure stable at this time. conts on metoprolol and lasix  6. Anxiety and depression Stable, conts on zoloft 50 mg daily  7. Atherosclerosis of native coronary artery of native heart without angina pectoris Stable without chest pains at this time, conts on nitro PRN  pt is stable for discharge-will need PT/OT/Nursing/HHA per home health. No DME needed. Rx written.  will need to follow up with PCP within 2 weeks.     Carlos American. Harle Battiest  [facility] [phone] 8 am - 5 pm) [phone] (after hours)

## 2015-08-22 ENCOUNTER — Encounter: Payer: Self-pay | Admitting: Family

## 2015-08-22 ENCOUNTER — Non-Acute Institutional Stay: Payer: Commercial Managed Care - HMO | Admitting: Family

## 2015-08-22 DIAGNOSIS — L03116 Cellulitis of left lower limb: Secondary | ICD-10-CM

## 2015-08-24 NOTE — Progress Notes (Signed)
Cardiology Office Note:    Date:  08/25/2015   ID:  [name], DOB April 16, 1930, MRN [id number]  PCP:  Nyoka Cowden, MD  Cardiologist:  Dr. Lauree Chandler   Electrophysiologist:  n/a  Chief Complaint  Patient presents with  . Atrial Fibrillation    Follow up    History of Present Illness:     [name] is a 82 y.o. female with a hx of CAD s/p BMS to RCA x 2 in 5573, diastolic HF, HTN, HL, COPD, pulmonary HTN, ? prior TIA, prior DVT, colon CA. Colon cancer diagnosed in 11/15. She underwent right hemicolectomy with diverting colostomy. She saw Dr. Domenic Polite in 5/16 and was cleared from a cardiac perspective for colostomy takedown and sigmoid colectomy for diverticulosis. She was seen by Lyda Jester, PA-C 12/16 for surgical clearance (for L TKR). ECG was noted to demonstrate new onset AFib with controlled VR. She was cleared for surgery and anticoagulation could be addressed after surgery. CHADS2-VASc=7 (age, gender, vascular disease, HTN, ?TIA - head CT in past with prior lacunar infarcts). I saw her after her colonoscopy (surveillance for Colon CA treatment) was cancelled due to uncontrolled HR. I adjusted her beta-blocker.  Her BNP was mildly elevated and I increased her Lasix for a couple days.   She underwent L femoral revision of TKR 08/02/15 with Dr. Wynelle Link.  She was placed on Apixaban post operatively for DVT prophylaxis.  She was DC to SNF.  She is now home. She is feeling well.  Her knee is still sore. She denies any chest pain or dyspnea.  She denies orthopnea, PND.  LE edema is mild and stable. She denies syncope.    Past Medical History  Diagnosis Date  . CAD (coronary artery disease)     a. LHC 4/09: pLAD 40, mDx 70-75, pLCx 30, mLCx 50, mRCA 90, EF 60% >> PCI: BMS x2 to RCA;  b. Myoview 8/12: normal  . Essential hypertension   . Chronic diastolic CHF (congestive heart failure) (HCC)     a. Echo 912 - Mild LVH, EF 55-60%, no RWMA, Gr 1 DD,  mild MR, mild LAE, mild RAE, PASP 59 mmHg (mod to severe pulmo HTN)  . History of TIA (transient ischemic attack)     a. Head CT 6/15: small chronic lacunar infarct in thalamus  . Hyperlipidemia   . Depression   . COPD with chronic bronchitis ([place])   . GERD (gastroesophageal reflux disease)   . History of hiatal hernia   . History of DVT of lower extremity   . Sigmoid diverticulosis     s/p sigmoid colectomy  . Colovaginal fistula     s/p colostomy >> colostomy takedown 5/16  . History of adenomatous polyp of colon   . Renal insufficiency   . OA (osteoarthritis)   . Wears glasses   . Wears dentures   . Colostomy in place [facility])     s/p colostomy takedown 5/16  . Peripheral neuropathy ([place])   . Chronic anemia   . History of blood transfusion   . Allergy   . Anxiety   . Cataract     bil cateracts removed  . Pulmonary HTN ([place])     a. PASP on Echo in 9/12:  59 mmHg  . Dysrhythmia     A fib  . Stroke ([place])     TIAs  . Pneumonia   . Adenocarcinoma, colon [facility]) Oncologist-- Dr Truitt Merle    Multifocal (2) Colon  cancer @ ileocecal valve and ascending ( [id number]), Stage IIIB, grade I, MMR normal , 2 of 16 +lymph nodes, negative surgical margins---  06-02-2014  Right hemicolectomy w/ colostomy    Past Surgical History  Procedure Laterality Date  . Appendectomy    . Cholecystectomy    . Abdominal hysterectomy    . Lumbar laminectomy  10-29-2002,  1969    Left  L3 -- 4  decompression  . Total knee arthroplasty Bilateral left 1994/  right 2000  . Rotator cuff repair Bilateral   . Cataract extraction w/ intraocular lens  implant, bilateral Bilateral   . Tonsillectomy    . Dilation and curettage of uterus    . Patellectomy Right 09/14/2013    Procedure: PATELLECTOMY;  Surgeon: Cammy Copa, MD;  Location: [facility] OR;  Service: Orthopedics;  Laterality: Right;  . Partial colectomy N/A 06/02/2014    Procedure: RIGHT PARTIAL COLECTOMY ;  Surgeon: Manus Rudd, MD;  Location: [facility] OR;   Service: General;  Laterality: N/A;  . Colostomy N/A 06/02/2014    Procedure: DIVERTING DESCENDING END COLOSTOMY;  Surgeon: Manus Rudd, MD;  Location: [facility] OR;  Service: General;  Laterality: N/A;  . Revision total knee arthroplasty Bilateral right  04-02-2011/  left 1996 & 10-05-1999  . Anterior cervical decomp/discectomy fusion  01-31-2009    C5 -- 7  . Cardiovascular stress test  02-26-2011    Normal lexiscan no exercise study/  no ischemia/  normal LV function and wall motion, ef 67%  . Transthoracic echocardiogram  12-01-2009    Grade I diastolic dysfunction/  ef 60%/  moderate MR/  mild TR  . Evaluation under anesthesia with anal fistulectomy N/A 10/13/2014    Procedure: ANAL EXAM UNDER ANESTHESIA ;  Surgeon: Romie Levee, MD;  Location: [facility] ORS;  Service: General;  Laterality: N/A;  . Proctoscopy N/A 10/13/2014    Procedure: RIDGE PROCTOSCOPY;  Surgeon: Romie Levee, MD;  Location: [facility] ORS;  Service: General;  Laterality: N/A;  . Coronary angioplasty with stent placement  11-04-2007  dr [email or web address]    BMS x2 to RCA/  mild Non-obstructive disease LAD/  normal LVF  . Bone spurs Bilateral     Feet  . Hand surgery      Tendon repair  . Carpal tunnel release Right   . Laparoscopic sigmoid colectomy N/A 11/24/2014    Procedure: SIGMOID COLECTOMY AND COLSOTOMY CLOSURE;  Surgeon: Manus Rudd, MD;  Location: [facility] OR;  Service: General;  Laterality: N/A;  . Total knee revision Left 08/02/2015    Procedure: LEFT FEMORAL REVISION;  Surgeon: Ollen Gross, MD;  Location: [facility] ORS;  Service: Orthopedics;  Laterality: Left;    Current Medications:   Outpatient Prescriptions Prior to Visit  Medication Sig Dispense Refill  . acetaminophen (TYLENOL) 325 MG tablet Take 650 mg by mouth every 6 (six) hours as needed for moderate pain.     . diphenhydrAMINE (BENADRYL) 12.5 MG/5ML elixir Take 5-10 mLs (12.5-25 mg total) by mouth every 4 (four) hours as needed for itching. 120 mL 0  . docusate sodium  (COLACE) 100 MG capsule Take 1 capsule (100 mg total) by mouth 2 (two) times daily. 60 capsule 0  . esomeprazole (NEXIUM) 20 MG capsule Take 20 mg by mouth daily at 12 noon.    . ferrous sulfate 325 (65 FE) MG tablet Take 1 tablet (325 mg total) by mouth 2 (two) times daily with a meal.  3  . furosemide (LASIX) 20 MG tablet Take 20 mg  daily today and tomorrow then decrease to 20 mg daily on Mondays, Wednesdays and Fridays 20 tablet 3  . gabapentin (NEURONTIN) 600 MG tablet Take 1 tablet by mouth 3 (three) times daily.    Marland Kitchen LORazepam (ATIVAN) 0.5 MG tablet Take 1 tablet (0.5 mg total) by mouth 2 (two) times daily. 20 tablet 0  . methocarbamol (ROBAXIN) 500 MG tablet Take 1 tablet (500 mg total) by mouth every 6 (six) hours as needed for muscle spasms. 80 tablet 0  . nitroGLYCERIN (NITROSTAT) 0.4 MG SL tablet Place 0.4 mg under the tongue every 5 (five) minutes as needed for chest pain (x 3 pills). Reported on 07/25/2015    . ondansetron (ZOFRAN) 4 MG tablet Take 1 tablet (4 mg total) by mouth every 6 (six) hours as needed for nausea. 40 tablet 0  . oxyCODONE (OXY IR/ROXICODONE) 5 MG immediate release tablet Take 1-2 tablets (5-10 mg total) by mouth every 3 (three) hours as needed for moderate pain or severe pain. 360 tablet 0  . Polyethyl Glycol-Propyl Glycol (SYSTANE OP) Place 1 drop into both eyes 2 (two) times daily as needed (for dry eyes).    . promethazine (PHENERGAN) 12.5 MG tablet Take 1 tablet by mouth every 6 (six) hours as needed for nausea or vomiting. Nausea/vomitting    . sertraline (ZOLOFT) 50 MG tablet Take 1 tablet by mouth daily. Reported on 07/25/2015    . traMADol (ULTRAM) 50 MG tablet Take 1-2 tablets (50-100 mg total) by mouth every 6 (six) hours as needed (mild pain). 80 tablet 1  . traZODone (DESYREL) 100 MG tablet Take 50 mg by mouth at bedtime. Take one half tablet (50 mg) as needed at bedtime for sleep    . triamcinolone cream (KENALOG) 0.1 % Apply 1 application topically 2  (two) times daily as needed (Eczema on legs). Reported on 07/25/2015    . metoprolol (LOPRESSOR) 50 MG tablet Take 1.5 tablets (75 mg total) by mouth 2 (two) times daily. 270 tablet 3  . aspirin 325 MG EC tablet Take 325 mg by mouth daily. Reported on 08/25/2015    . metoCLOPramide (REGLAN) 5 MG tablet Take 1 tablet (5 mg total) by mouth every 8 (eight) hours as needed for nausea (if ondansetron (ZOFRAN) ineffective.). (Patient not taking: Reported on 08/25/2015) 40 tablet 0  . polyethylene glycol (MIRALAX / GLYCOLAX) packet Take 17 g by mouth daily as needed for mild constipation. (Patient not taking: Reported on 08/25/2015) 14 each 0  . sodium phosphate (FLEET) 7-19 GM/118ML ENEM Place 133 mLs (1 enema total) rectally once as needed for severe constipation. (Patient not taking: Reported on 08/25/2015) 2 Bottle 0   No facility-administered medications prior to visit.     Allergies:   Review of patient's allergies indicates no known allergies.   Social History   Social History  . Marital Status: Married    Spouse Name: N/A  . Number of Children: N/A  . Years of Education: N/A   Social History Main Topics  . Smoking status: Former Smoker -- 0.50 packs/day for 15 years    Types: Cigarettes    Quit date: 07/16/1967  . Smokeless tobacco: Never Used  . Alcohol Use: No  . Drug Use: No  . Sexual Activity: Not Asked   Other Topics Concern  . None   Social History Narrative   Married, 2 children.  As of November 2015 her husband has been living in a nursing home for tendon after years as he suffers  from multiple medical problems.   Patient does not drink alcohol nor does she smoke or chew tobacco products   Right handed   8th grade   1 cup daily     Family History:  The patient's family history includes Cancer (age of onset: 34) in her brother; Cancer (age of onset: 57) in her sister; Cancer (age of onset: 78) in her maternal uncle; Colon cancer in her maternal uncle; Heart failure in her  mother; Osteoarthritis in her mother. There is no history of Esophageal cancer, Rectal cancer, or Stomach cancer.   ROS:   Please see the history of present illness.    Review of Systems  Hematologic/Lymphatic: Bruises/bleeds easily.  All other systems reviewed and are negative.   Physical Exam:    VS:  BP 120/80 mmHg  Pulse 113  Ht '5\' 3"'$  (1.6 m)  Wt 181 lb (82.101 kg)  BMI 32.07 kg/m2   GEN: Well nourished, well developed, in no acute distress HEENT: normal Neck: no JVD, no masses Cardiac: Normal S1/S2, irreg irreg rhythm; no murmurs, trace bilateral LE edema   Respiratory:  clear to auscultation bilaterally; no wheezing, rhonchi or rales GI: soft, nontender  MS: anterior scar L knee Skin: warm and dry  Neuro: no focal deficits  Psych: Alert and oriented x 3, normal affect  Wt Readings from Last 3 Encounters:  08/25/15 181 lb (82.101 kg)  08/21/15 181 lb 9.6 oz (82.373 kg)  08/10/15 183 lb (83.008 kg)      Studies/Labs Reviewed:     EKG:  EKG is  ordered today.  The ekg ordered today demonstrates AFib, HR 113, NSSTTW changes  Recent Labs: 08/09/2015: ALT 17; Hemoglobin 10.2*; Platelets 184 08/15/2015: BUN 22*; Creatinine 1.2*; Potassium 4.6; Sodium 138   Recent Lipid Panel    Component Value Date/Time   CHOL 130 01/19/2013 1014   TRIG 87.0 01/19/2013 1014   HDL 33.60* 01/19/2013 1014   CHOLHDL 4 01/19/2013 1014   VLDL 17.4 01/19/2013 1014   LDLCALC 79 01/19/2013 1014    Additional studies/ records that were reviewed today include:   Echo 07/27/15 Mild LVH, EF 50-55%, no RWMA, mod MR, severe LAE, mild RAE, PASP 44 mmHg  Echo 9/12 Mild LVH, EF 55-60%, no RWMA, Gr 1 DD, mild MR, mild LAE, mild RAE, PASP 59 mmHg (mod to severe pulmo HTN)  Myoview 8/12 Normal stress nuclear study  LHC 4/09 LM ok LAD calcified, prox and mid 40%, Dx mid 70-75% LCx prox 30%, mid AV groove 50% RCA mid 90% EF 60% PCI: Vision BMS x 2 to mRCA   ASSESSMENT:     1.  Persistent atrial fibrillation ([place])   2. Atherosclerosis of native coronary artery of native heart without angina pectoris   3. DIASTOLIC HEART FAILURE, CHRONIC   4. Essential hypertension     PLAN:     In order of problems listed above:  1. Persistent AFib - HR is elevated.  Some of this [place] be due to blood loss and pain from surgery. CHADS2-VASc=7. She needs long term anticoagulation. We discussed the risks and benefits of anticoagulation.  Her LA is fairly large.  I doubt she will maintain NSR.    -  Start Eliquis 5 mg bid (if cost is an issue she is willing to change to Coumadin)  -  Increase Metoprolol Tartrate to 100 mg bid   -  FU with me in 1 month.  If symptomatic from AF, could try to arrange  DCCV to see if helpful to be in NSR.  -  BMET, CBC at FU with me in 1 month.   2. CAD - No recurrent angina. Recent Echo with normal LVF and normal wall motion. Continue beta blocker. No ASA as we are starting her on Eliquis.   3. Diastolic HF - Volume stable.  Continue current Rx.   4. HTN - BP controlled.     Medication Adjustments/Labs and Tests Ordered: Current medicines are reviewed at length with the patient today.  Concerns regarding medicines are outlined above.  Medication changes, Labs and Tests ordered today are outlined in the Patient Instructions noted below. Patient Instructions  Medication Instructions:  1. STOP ASPIRIN 2. INCREASE METOPROLOL TO 100 MG TWICE DAILY; YOU CAN USE UP THE 50 TABLETS BY TAKING 2 TABLE TS IN THE MORNING AND 2 TABLETS IN THE PM 3. START ELIQUIS 5 MG TWICE DAILY  Labwork: 09/21/15 FOR BMET, CBC W/DIFF WHEN YOU SEE Kallum Jorgensen, PAC IN 1 MONTH  Testing/Procedures: NONE  Follow-Up: 09/21/15 @ 11:30 WITH Myka Lukins WEAVE, [facility]  12/19/15 @ 11:15 WITH DR. Angelena Form  Any Other Special Instructions Will Be Listed Below (If Applicable).  If you need a refill on your cardiac medications before your next appointment, please call your  pharmacy.   Signed, Richardson Dopp, PA-C  08/25/2015 12:56 PM    [facility] [address], [place], [place]  [address] Phone: [phone]; Fax: [phone]

## 2015-08-25 ENCOUNTER — Other Ambulatory Visit: Payer: Self-pay | Admitting: Internal Medicine

## 2015-08-25 ENCOUNTER — Ambulatory Visit (INDEPENDENT_AMBULATORY_CARE_PROVIDER_SITE_OTHER): Payer: Commercial Managed Care - HMO | Admitting: Physician Assistant

## 2015-08-25 ENCOUNTER — Encounter: Payer: Self-pay | Admitting: Physician Assistant

## 2015-08-25 VITALS — BP 120/80 | HR 113 | Ht 63.0 in | Wt 181.0 lb

## 2015-08-25 DIAGNOSIS — I5032 Chronic diastolic (congestive) heart failure: Secondary | ICD-10-CM | POA: Diagnosis not present

## 2015-08-25 DIAGNOSIS — I2510 Atherosclerotic heart disease of native coronary artery without angina pectoris: Secondary | ICD-10-CM

## 2015-08-25 DIAGNOSIS — I481 Persistent atrial fibrillation: Secondary | ICD-10-CM

## 2015-08-25 DIAGNOSIS — I10 Essential (primary) hypertension: Secondary | ICD-10-CM

## 2015-08-25 DIAGNOSIS — I4819 Other persistent atrial fibrillation: Secondary | ICD-10-CM

## 2015-08-25 MED ORDER — METOPROLOL TARTRATE 100 MG PO TABS: 100.0000 mg | ORAL_TABLET | Freq: Two times a day (BID) | ORAL | Status: DC

## 2015-08-25 MED ORDER — APIXABAN 5 MG PO TABS: 5.0000 mg | ORAL_TABLET | Freq: Two times a day (BID) | ORAL | Status: DC

## 2015-08-25 NOTE — Patient Instructions (Addendum)
Medication Instructions:  1. STOP ASPIRIN 2. INCREASE METOPROLOL TO 100 MG TWICE DAILY; YOU CAN USE UP THE 50 TABLETS BY TAKING 2 TABLE TS IN THE MORNING AND 2 TABLETS IN THE PM 3. START ELIQUIS 5 MG TWICE DAILY  Labwork: 09/21/15 FOR BMET, CBC W/DIFF WHEN YOU SEE SCOTT WEAVER, PAC IN 1 MONTH  Testing/Procedures: NONE  Follow-Up: 09/21/15 @ 11:30 WITH SCOTT WEAVE, [facility]  12/19/15 @ 11:15 WITH DR. Angelena Form  Any Other Special Instructions Will Be Listed Below (If Applicable).  If you need a refill on your cardiac medications before your next appointment, please call your pharmacy.

## 2015-09-01 ENCOUNTER — Ambulatory Visit (HOSPITAL_BASED_OUTPATIENT_CLINIC_OR_DEPARTMENT_OTHER): Payer: Commercial Managed Care - HMO | Admitting: Hematology

## 2015-09-01 ENCOUNTER — Encounter: Payer: Self-pay | Admitting: Hematology

## 2015-09-01 ENCOUNTER — Other Ambulatory Visit (HOSPITAL_BASED_OUTPATIENT_CLINIC_OR_DEPARTMENT_OTHER): Payer: Commercial Managed Care - HMO

## 2015-09-01 ENCOUNTER — Ambulatory Visit: Payer: Commercial Managed Care - HMO | Admitting: Cardiology

## 2015-09-01 ENCOUNTER — Telehealth: Payer: Self-pay | Admitting: Hematology

## 2015-09-01 VITALS — BP 131/77 | HR 99 | Temp 98.2°F | Resp 18 | Ht 63.0 in | Wt 182.2 lb

## 2015-09-01 DIAGNOSIS — C182 Malignant neoplasm of ascending colon: Secondary | ICD-10-CM

## 2015-09-01 DIAGNOSIS — D649 Anemia, unspecified: Secondary | ICD-10-CM | POA: Diagnosis not present

## 2015-09-01 DIAGNOSIS — I2510 Atherosclerotic heart disease of native coronary artery without angina pectoris: Secondary | ICD-10-CM

## 2015-09-01 LAB — COMPREHENSIVE METABOLIC PANEL
ALT: 18 U/L (ref 0–55)
AST: 23 U/L (ref 5–34)
Albumin: 3.8 g/dL (ref 3.5–5.0)
Alkaline Phosphatase: 104 U/L (ref 40–150)
Anion Gap: 12 mEq/L — ABNORMAL HIGH (ref 3–11)
BUN: 30.7 mg/dL — ABNORMAL HIGH (ref 7.0–26.0)
CO2: 25 mEq/L (ref 22–29)
Calcium: 9.3 mg/dL (ref 8.4–10.4)
Chloride: 104 mEq/L (ref 98–109)
Creatinine: 1.5 mg/dL — ABNORMAL HIGH (ref 0.6–1.1)
EGFR: 32 mL/min/{1.73_m2} — ABNORMAL LOW (ref >90)
Glucose: 90 mg/dl (ref 70–140)
Potassium: 5.1 mEq/L (ref 3.5–5.1)
Sodium: 141 mEq/L (ref 136–145)
Total Bilirubin: 0.3 mg/dL (ref 0.20–1.20)
Total Protein: 7.8 g/dL (ref 6.4–8.3)

## 2015-09-01 LAB — CBC WITH DIFFERENTIAL/PLATELET
BASO%: 0.2 % (ref 0.0–2.0)
Basophils Absolute: 0 10*3/uL (ref 0.0–0.1)
EOS%: 1.6 % (ref 0.0–7.0)
Eosinophils Absolute: 0.1 10*3/uL (ref 0.0–0.5)
HCT: 35.9 % (ref 34.8–46.6)
HGB: 11.1 g/dL — ABNORMAL LOW (ref 11.6–15.9)
LYMPH%: 24.6 % (ref 14.0–49.7)
MCH: 28.9 pg (ref 25.1–34.0)
MCHC: 30.9 g/dL — ABNORMAL LOW (ref 31.5–36.0)
MCV: 93.5 fL (ref 79.5–101.0)
MONO#: 0.7 10*3/uL (ref 0.1–0.9)
MONO%: 9.1 % (ref 0.0–14.0)
NEUT#: 5.2 10*3/uL (ref 1.5–6.5)
NEUT%: 64.5 % (ref 38.4–76.8)
Platelets: 209 10*3/uL (ref 145–400)
RBC: 3.84 10*6/uL (ref 3.70–5.45)
RDW: 14.6 % — ABNORMAL HIGH (ref 11.2–14.5)
WBC: 8.1 10*3/uL (ref 3.9–10.3)
lymph#: 2 10*3/uL (ref 0.9–3.3)

## 2015-09-01 NOTE — Telephone Encounter (Signed)
per pof to sch pt appt-gave pt copy of avs °

## 2015-09-01 NOTE — Progress Notes (Signed)
[place]  [phone] [phone]  Clinic Follow Up Note   Patient Care Team: Marletta Lor, MD as PCP - General (Internal Medicine) 09/01/2015  CHIEF COMPLAINTS:  Follow up multifocal colon cancer    Cancer of ascending colon ([place])   06/02/2014 Initial Diagnosis Adenocarcinoma, colon. she presented with anemia, nauseam anorexia and abdominal pain and was [facility].   06/02/2014 Surgery exploratory laparotomy, right hemicolectomy, and descending colostomy by Dr. Georgette Dover   06/02/2014 Pathologic Stage multifocal (2) colon cancer at  ileocecal valve and ascending colon.,  mT4N1Mx, at least stage IIIB, grade 1, MMR normal, surgical margins negative.    06/02/2014 Tumor Marker CEA 1.7   11/24/2014 Surgery SIGMOID COLECTOMY AND COLSOTOMY CLOSURE with Donnie Mesa     HISTORY OF PRESENTING ILLNESS:  Gricel Copen Hamid 82 y.o. female is here because of recently diagnosed and resected multifocal colon cancer. I saw him when she was admitted to the hospital, and she is here with her family members for follow-up.  She has a history of chronic anemia and colovaginal fistula as well as diverticulosis with colonic polyps since 2008, was admitted to [facility] on 05/31/2014 With progressive abdominal pain, worse over the last couple of weeks, accompanied by nausea and vomiting, without weight loss. Workup included a CT of the abdomen and pelvis with contrast, which revealed acute small bowel obstruction due to what appears to be a soft tissue mass (apple core lesion) at the ileocecal valve suspicious for intestinal adenocarcinoma.This mass measures 6 x 4 cm. Up to 8 mm stable pericecal lymph nodes were visualized. No liver or distant metastasis identified. Small volume free fluid in the abdomen and pelvis, Likely reactive, was seen. No free air.  She underwent exploratory laparotomy, right hemicolectomy, and descending colostomy by Dr.  Georgette Dover on 06/02/2014. Grossly, there are two tumors identified. The larger tumor is located at the ileocecal valve and consists of an 8.5 cm span of moderately differentiated adenocarcinoma that involves the serosa. This tumor has associated perineural and lymphovascular invasion. The second discontiguous tumor is present in the proximal right colon and spans 4.0 cm and consists of well differentiated adenocarcinoma that is morphologically dissimilar from the larger tumor. This tumor extends into the muscularis propria.Her CEA is 1.7. It is stage mT4, N1b. immunohistochemical stains are normal, with very low probability of microsatellite instability (See details below ).  She was discharged to rehabilitation after her surgery. She has been doing well at to rehabilitation. She had a surgical wound infection a few weeks after surgery, which was treated with oral antibiotics and wound care. She was not very physically active before the surgery due to her multiple arthritis, she is recovering well at the nursing home and is likely going home in a few days. She is able to move around with a walker, has good appetite, surgical site pain is controlled, no nausea or other complaints. She did developed some UTI symptoms, and started Cipro and probiotics yesterday at the nursing home.  CURRENT THERAPY: Surveillance  INTERIM HISTORY She returns for follow up, she is accompanied by her daughter to the clinic today. She is doing well overall, had her 4th left knee replacement in January 2017, and has recovered well after rehabilitation. She still has some pain at the left knee, but is able to walk and tolerate more activity than before. She did try colonoscopy a few months ago, but was found to have very high blood pressure  before the procedure, and colonoscopy was aborted. She has intermittent chest pain and dyspnea, has been seeing her cardiologist lately.  MEDICAL HISTORY:  Past Medical History  Diagnosis Date  .  CAD (coronary artery disease)     a. LHC 4/09: pLAD 40, mDx 70-75, pLCx 30, mLCx 50, mRCA 90, EF 60% >> PCI: BMS x2 to RCA;  b. Myoview 8/12: normal  . Essential hypertension   . Chronic diastolic CHF (congestive heart failure) (HCC)     a. Echo 912 - Mild LVH, EF 55-60%, no RWMA, Gr 1 DD, mild MR, mild LAE, mild RAE, PASP 59 mmHg (mod to severe pulmo HTN)  . History of TIA (transient ischemic attack)     a. Head CT 6/15: small chronic lacunar infarct in thalamus  . Hyperlipidemia   . Depression   . COPD with chronic bronchitis ([place])   . GERD (gastroesophageal reflux disease)   . History of hiatal hernia   . History of DVT of lower extremity   . Sigmoid diverticulosis     s/p sigmoid colectomy  . Colovaginal fistula     s/p colostomy >> colostomy takedown 5/16  . History of adenomatous polyp of colon   . Renal insufficiency   . OA (osteoarthritis)   . Wears glasses   . Wears dentures   . Colostomy in place [facility])     s/p colostomy takedown 5/16  . Peripheral neuropathy ([place])   . Chronic anemia   . History of blood transfusion   . Allergy   . Anxiety   . Cataract     bil cateracts removed  . Pulmonary HTN ([place])     a. PASP on Echo in 9/12:  59 mmHg  . Dysrhythmia     A fib  . Stroke ([place])     TIAs  . Pneumonia   . Adenocarcinoma, colon [facility]) Oncologist-- Dr Truitt Merle    Multifocal (2) Colon cancer @ ileocecal valve and ascending ( mT4N1Mx), Stage IIIB, grade I, MMR normal , 2 of 16 +lymph nodes, negative surgical margins---  06-02-2014  Right hemicolectomy w/ colostomy    SURGICAL HISTORY: Past Surgical History  Procedure Laterality Date  . Appendectomy    . Cholecystectomy    . Abdominal hysterectomy    . Lumbar laminectomy  10-29-2002,  1969    Left  L3 -- 4  decompression  . Total knee arthroplasty Bilateral left 1994/  right 2000  . Rotator cuff repair Bilateral   . Cataract extraction w/ intraocular lens  implant, bilateral Bilateral   . Tonsillectomy    .  Dilation and curettage of uterus    . Patellectomy Right 09/14/2013    Procedure: PATELLECTOMY;  Surgeon: Meredith Pel, MD;  Location: [place];  Service: Orthopedics;  Laterality: Right;  . Partial colectomy N/A 06/02/2014    Procedure: RIGHT PARTIAL COLECTOMY ;  Surgeon: Donnie Mesa, MD;  Location: [place];  Service: General;  Laterality: N/A;  . Colostomy N/A 06/02/2014    Procedure: DIVERTING DESCENDING END COLOSTOMY;  Surgeon: Donnie Mesa, MD;  Location: [place];  Service: General;  Laterality: N/A;  . Revision total knee arthroplasty Bilateral right  04-02-2011/  left 1996 & 10-05-1999  . Anterior cervical decomp/discectomy fusion  01-31-2009    C5 -- 7  . Cardiovascular stress test  02-26-2011    Normal lexiscan no exercise study/  no ischemia/  normal LV function and wall motion, ef 67%  . Transthoracic echocardiogram  12-01-2009  Grade I diastolic dysfunction/  ef 60%/  moderate MR/  mild TR  . Evaluation under anesthesia with anal fistulectomy N/A 10/13/2014    Procedure: ANAL EXAM UNDER ANESTHESIA ;  Surgeon: [name], MD;  Location: [facility];  Service: General;  Laterality: N/A;  . Proctoscopy N/A 10/13/2014    Procedure: RIDGE PROCTOSCOPY;  Surgeon: [name], MD;  Location: [facility];  Service: General;  Laterality: N/A;  . Coronary angioplasty with stent placement  11-04-2007  dr [email or web address]    BMS x2 to RCA/  mild Non-obstructive disease LAD/  normal LVF  . Bone spurs Bilateral     Feet  . Hand surgery      Tendon repair  . Carpal tunnel release Right   . Laparoscopic sigmoid colectomy N/A 11/24/2014    Procedure: SIGMOID COLECTOMY AND COLSOTOMY CLOSURE;  Surgeon: Donnie Mesa, MD;  Location: [place];  Service: General;  Laterality: N/A;  . Total knee revision Left 08/02/2015    Procedure: LEFT FEMORAL REVISION;  Surgeon: Gaynelle Arabian, MD;  Location: [facility];  Service: Orthopedics;  Laterality: Left;    SOCIAL HISTORY: Social History   Social History  . Marital  Status: Married    Spouse Name: N/A  . Number of Children: N/A  . Years of Education: N/A   Occupational History  . Not on file.   Social History Main Topics  . Smoking status: Former Smoker -- 0.50 packs/day for 15 years    Types: Cigarettes    Quit date: 07/16/1967  . Smokeless tobacco: Never Used  . Alcohol Use: No  . Drug Use: No  . Sexual Activity: Not on file   Other Topics Concern  . Not on file   Social History Narrative   Married, 2 children.  As of November 2015 her husband has been living in a nursing home for tendon after years as he suffers from multiple medical problems.   Patient does not drink alcohol nor does she smoke or chew tobacco products   Right handed   8th grade   1 cup daily   She worked for [employer] for 4 years.    FAMILY HISTORY: Family History  Problem Relation Age of Onset  . Osteoarthritis Mother   . Heart failure Mother   . Cancer Sister 84    ? colon cancer   . Cancer Brother 30    Kidney cancer   . Cancer Maternal Uncle 62    Colon cancer   . Colon cancer Maternal Uncle   . Esophageal cancer Neg Hx   . Rectal cancer Neg Hx   . Stomach cancer Neg Hx     ALLERGIES:  has No Known Allergies.  MEDICATIONS:  Current Outpatient Prescriptions  Medication Sig Dispense Refill  . acetaminophen (TYLENOL) 325 MG tablet Take 650 mg by mouth every 6 (six) hours as needed for moderate pain.     Marland Kitchen apixaban (ELIQUIS) 5 MG TABS tablet Take 1 tablet (5 mg total) by mouth 2 (two) times daily. 180 tablet 3  . diphenhydrAMINE (BENADRYL) 12.5 MG/5ML elixir Take 5-10 mLs (12.5-25 mg total) by mouth every 4 (four) hours as needed for itching. 120 mL 0  . docusate sodium (COLACE) 100 MG capsule Take 1 capsule (100 mg total) by mouth 2 (two) times daily. 60 capsule 0  . esomeprazole (NEXIUM) 20 MG capsule Take 20 mg by mouth daily at 12 noon.    . ferrous sulfate 325 (65 FE) MG tablet Take 1 tablet (  325 mg total) by mouth 2 (two) times daily with a  meal.  3  . furosemide (LASIX) 20 MG tablet Take 20 mg daily today and tomorrow then decrease to 20 mg daily on Mondays, Wednesdays and Fridays 20 tablet 3  . gabapentin (NEURONTIN) 600 MG tablet Take 1 tablet by mouth 3 (three) times daily.    Marland Kitchen LORazepam (ATIVAN) 0.5 MG tablet Take 1 tablet (0.5 mg total) by mouth 2 (two) times daily. 20 tablet 0  . methocarbamol (ROBAXIN) 500 MG tablet Take 1 tablet (500 mg total) by mouth every 6 (six) hours as needed for muscle spasms. 80 tablet 0  . metoprolol (LOPRESSOR) 100 MG tablet Take 1 tablet (100 mg total) by mouth 2 (two) times daily. 180 tablet 3  . ondansetron (ZOFRAN) 4 MG tablet Take 1 tablet (4 mg total) by mouth every 6 (six) hours as needed for nausea. 40 tablet 0  . oxyCODONE (OXY IR/ROXICODONE) 5 MG immediate release tablet Take 1-2 tablets (5-10 mg total) by mouth every 3 (three) hours as needed for moderate pain or severe pain. 360 tablet 0  . Polyethyl Glycol-Propyl Glycol (SYSTANE OP) Place 1 drop into both eyes 2 (two) times daily as needed (for dry eyes).    . promethazine (PHENERGAN) 12.5 MG tablet Take 1 tablet by mouth every 6 (six) hours as needed for nausea or vomiting. Nausea/vomitting    . sertraline (ZOLOFT) 50 MG tablet Take 1 tablet by mouth daily. Reported on 07/25/2015    . traMADol (ULTRAM) 50 MG tablet Take 1-2 tablets (50-100 mg total) by mouth every 6 (six) hours as needed (mild pain). 80 tablet 1  . traZODone (DESYREL) 100 MG tablet Take 50 mg by mouth at bedtime. Take one half tablet (50 mg) as needed at bedtime for sleep    . triamcinolone cream (KENALOG) 0.1 % Apply 1 application topically 2 (two) times daily as needed (Eczema on legs). Reported on 07/25/2015    . metoprolol (LOPRESSOR) 50 MG tablet Reported on 09/01/2015    . nitroGLYCERIN (NITROSTAT) 0.4 MG SL tablet Place 0.4 mg under the tongue every 5 (five) minutes as needed for chest pain (x 3 pills). Reported on 09/01/2015     No current facility-administered  medications for this visit.    REVIEW OF SYSTEMS:   Constitutional: Denies fevers, chills or abnormal night sweats Eyes: Denies blurriness of vision, double vision or watery eyes Ears, nose, mouth, throat, and face: Denies mucositis or sore throat Respiratory: Denies cough, dyspnea or wheezes Cardiovascular: Denies palpitation, chest discomfort or lower extremity swelling Gastrointestinal:  Denies nausea, heartburn or change in bowel habits Skin: Denies abnormal skin rashes Lymphatics: Denies new lymphadenopathy or easy bruising Neurological:Denies numbness, tingling or new weaknesses Behavioral/Psych: Mood is stable, no new changes  All other systems were reviewed with the patient and are negative.  PHYSICAL EXAMINATION: ECOG PERFORMANCE STATUS: 2-3  Filed Vitals:   09/01/15 1423  BP: 131/77  Pulse: 99  Temp: 98.2 F (36.8 C)  Resp: 18   Filed Weights   09/01/15 1423  Weight: 182 lb 3.2 oz (82.645 kg)    GENERAL:alert, no distress and comfortable SKIN: skin color, texture, turgor are normal, no rashes or significant lesions EYES: normal, conjunctiva are pink and non-injected, sclera clear OROPHARYNX:no exudate, no erythema and lips, buccal mucosa, and tongue normal  NECK: supple, thyroid normal size, non-tender, without nodularity LYMPH:  no palpable lymphadenopathy in the cervical, axillary or inguinal LUNGS: clear to auscultation and percussion  with normal breathing effort HEART: regular rate & rhythm and no murmurs and no lower extremity edema ABDOMEN:abdomen soft, the midline surgical wound is well healed, appears clean. Mild tenderness at RLQ  and normal bowel sounds Musculoskeletal:no cyanosis of digits and no clubbing  PSYCH: alert & oriented x 3 with fluent speech NEURO: no focal motor/sensory deficits.   LABORATORY DATA:  I have reviewed the data as listed CBC Latest Ref Rng 09/01/2015 08/09/2015 08/04/2015  WBC 3.9 - 10.3 10e3/uL 8.1 6.9 11.8(H)  Hemoglobin  11.6 - 15.9 g/dL 11.1(L) 10.2(A) 10.4(L)  Hematocrit 34.8 - 46.6 % 35.9 33(A) 32.1(L)  Platelets 145 - 400 10e3/uL 209 184 130(L)    CMP Latest Ref Rng 09/01/2015 08/15/2015 08/14/2015  Glucose 70 - 140 mg/dl 90 - -  BUN 7.0 - 26.0 mg/dL 30.7(H) 22(A) 24(A)  Creatinine 0.6 - 1.1 mg/dL 1.5(H) 1.2(A) 1.1  Sodium 136 - 145 mEq/L 141 138 141  Potassium 3.5 - 5.1 mEq/L 5.1 4.6 4.9  Chloride 101 - 111 mmol/L - - -  CO2 22 - 29 mEq/L 25 - -  Calcium 8.4 - 10.4 mg/dL 9.3 - -  Total Protein 6.4 - 8.3 g/dL 7.8 - -  Total Bilirubin 0.20 - 1.20 mg/dL 0.30 - -  Alkaline Phos 40 - 150 U/L 104 - -  AST 5 - 34 U/L 23 - -  ALT 0 - 55 U/L 18 - -   CEA (Parallel Testing)  Status: Finalresult Visible to patient:  Not Released Nextappt: 09/05/2015 at 10:30 AM in Family Medicine Nyoka Cowden, MD)           Ref Range 1d ago  50moago  751mogo     CEA ng/mL 1.6 1.8R 1.8R         Pathology report: 06/02/2014 Colon, segmental resection for tumor, right - MULTIFOCAL INVASIVE ADENOCARCINOMA, SPANNING 8.5 CM AND 4.0 CM. - ADENOCARCINOMA INVOLVES THE SEROSA. - LYMPHOVASCULAR INVASION IS IDENTIFIED. 1 of 4 FINAL for Fike, Chamille C ([id number]) Diagnosis(continued) - PERINEURAL INVASION IS IDENTIFIED. - METASTATIC ADENOCARCINOMA IN 2 OF 16 LYMPH NODES (2/16) WITH EXTRACAPSULAR EXTENSION. - THE SURGICAL RESECTION MARGINS ARE NEGATIVE FOR ADENOCARCINOMA. - SEE ONCOLOGY TABLE BELOW. Microscopic Comment COLON AND RECTUM (INCLUDING TRANS-ANAL RESECTION): Specimen: Terminal ileum and right colon Procedure: Resection Tumor site: Ileocecal valve (tumor #1) and proximal mid ascending colon (tumor #2) Specimen integrity: Intact Macroscopic intactness of mesorectum: N/A Macroscopic tumor perforation: Not identified Invasive tumor: Tumor #1 (ileocecal valve) Maximum size: 8.5 cm (gross measurement) Histologic type(s): Adenocarcinoma Histologic grade and differentiation: G2:  moderately differentiated/low grade Type of polyp in which invasive carcinoma arose: Likely tubular adenoma Microscopic extension of invasive tumor: Adenocarcinoma involves the serosa Invasive tumor: Tumor #2 (proximal mid ascending colon) Maximum size: 4.0 cm (gross measurement) Histologic type(s): Adenocarcinoma Histologic grade and differentiation: G1: well differentiated/low grade Type of polyp in which invasive carcinoma arose: Tubulovillous adenoma Microscopic extension of invasive tumor: Adenocarcinoma extends into the muscularis propria Lymph-Vascular invasion: Present Peri-neural invasion: Present Tumor deposit(s) (discontinuous extramural extension): Not identified Resection margins: Proximal margin: 32.0 cm Distal margin: 7.5 cm Circumferential (radial) (posterior ascending, posterior descending; lateral and posterior mid-rectum; and entire lower 1/3 rectum): 3.0 cm Treatment effect (neo-adjuvant therapy): N/A Additional polyp(s): Not identified Non-neoplastic findings: No significant findings Lymph nodes: number examined 16; number positive: 2 Pathologic Staging: mT4, N1b Ancillary studies: MSI testing will be performed on the larger higher grade tumor and the results reported separately. Additional studies can be performed upon  clinician request. Comments: Grossly, there are two tumors identified. The larger tumor is located at the ileocecal valve and consists of an 8.5 cm span of moderately differentiated adenocarcinoma that involves the serosa. This tumor has associated perineural and lymphovascular invasion. The second discontiguous tumor is present in the proximal right colon and spans 4.0 cm and consists of well differentiated adenocarcinoma that is morphologically dissimilar from the larger tumor. This tumor extends into the muscularis propria. Enid Cutter MD Pathologist, Electronic Signature (Case signed 06/06/2014) Specimen Gross and Clinical  Information   RADIOGRAPHIC STUDIES: I have personally reviewed the radiological images as listed and agreed with the findings in the report.  Ct abdomen and pelvis with contrast 04/26/2015 IMPRESSION: Postsurgical changes related to partial right hemicolectomy and partial left hemicolectomy. No evidence of bowel obstruction.  No findings to suggest recurrent or metastatic disease.  Prior rectovaginal fistulas is not apparent on the current study.  Additional ancillary findings as above.  ASSESSMENT & PLAN:  82 year old Caucasian female, with multiple medical comorbidities, presented with anemia, abdominal pain and nausea. She underwent exploratory laparotomy, right hemicolectomy, and descending colostomy by Dr. Georgette Dover on 06/02/2014. Surgical path reviewed multifocal (2) colon cancer at  ileocecal valve and ascending colon.  1. Multifocal invasive colon adenocarcinoma, mT4N1Mx, at least stage IIIB, grade 1, MMR normal. -I reviewed her surgical findings and her CT of abdomen and pelvis with patient and her extended family members in great detail today. Both of her colon cancer were completely surgically resected, however she does have high risk of cancer recurrence in the future due to locally advanced stage.  -Her surveillance CT scan from 04/26/2015 showed no evidence of recurrence -I reviewed her lab today. She still has mild anemia, overall stable. Her creatinine has one up to 1.5 today, which is likely due to her Lasix. She will follow-up with her cardiologist. Burnis Medin continue surveillance. I'll see her back in 4 months with lab. Next surveillance CT scan in October 2017 -She attempted colonoscopy, but procedure was aborted due to her high blood pressure and complicated cardiac history.   2. Genetics -Due to her family history of colon cancer, and her daughters multifocal polyps, I recommend her to have a genetic counseling. Her daughter met our genetic counselor before and genetic  testing was negative. -She declined genetic testing at this point.  3. Anemia -stable mild anemia  -serum iron and ferritin were normal  -Continue oral iron supplement.   4. CAD, CHF -She will continue follow-up with her cardiologist Dr. Kathlen Mody  Follow-up with her primary care physician for other medical issues.  Plan return to clinic in 4 month with labs    All questions were answered. The patient knows to call the clinic with any problems, questions or concerns. I spent 20 minutes counseling the patient face to face. The total time spent in the appointment was 25 minutes and more than 50% was on counseling.     Truitt Merle, MD 09/01/2015 2:57 PM

## 2015-09-02 ENCOUNTER — Encounter: Payer: Self-pay | Admitting: Hematology

## 2015-09-02 LAB — CEA: CEA: 2.6 ng/mL (ref 0.0–4.7)

## 2015-09-02 LAB — CEA (PARALLEL TESTING): CEA: 1.6 ng/mL

## 2015-09-04 ENCOUNTER — Ambulatory Visit: Payer: Commercial Managed Care - HMO | Admitting: Internal Medicine

## 2015-09-05 ENCOUNTER — Ambulatory Visit (INDEPENDENT_AMBULATORY_CARE_PROVIDER_SITE_OTHER): Payer: Commercial Managed Care - HMO | Admitting: Internal Medicine

## 2015-09-05 ENCOUNTER — Encounter: Payer: Self-pay | Admitting: Internal Medicine

## 2015-09-05 VITALS — BP 110/70 | HR 108 | Temp 97.6°F | Resp 20 | Ht 63.0 in | Wt 179.0 lb

## 2015-09-05 DIAGNOSIS — C182 Malignant neoplasm of ascending colon: Secondary | ICD-10-CM | POA: Diagnosis not present

## 2015-09-05 DIAGNOSIS — I10 Essential (primary) hypertension: Secondary | ICD-10-CM

## 2015-09-05 DIAGNOSIS — I5032 Chronic diastolic (congestive) heart failure: Secondary | ICD-10-CM | POA: Diagnosis not present

## 2015-09-05 DIAGNOSIS — I481 Persistent atrial fibrillation: Secondary | ICD-10-CM

## 2015-09-05 DIAGNOSIS — Z01810 Encounter for preprocedural cardiovascular examination: Secondary | ICD-10-CM | POA: Diagnosis not present

## 2015-09-05 DIAGNOSIS — T84093S Other mechanical complication of internal left knee prosthesis, sequela: Secondary | ICD-10-CM

## 2015-09-05 DIAGNOSIS — I4819 Other persistent atrial fibrillation: Secondary | ICD-10-CM

## 2015-09-05 MED ORDER — HYDROCODONE-ACETAMINOPHEN 5-325 MG PO TABS: 1.0000 | ORAL_TABLET | Freq: Four times a day (QID) | ORAL | Status: DC | PRN

## 2015-09-05 NOTE — Progress Notes (Signed)
Pre visit review using our clinic review tool, if applicable. No additional management support is needed unless otherwise documented below in the visit note. 

## 2015-09-05 NOTE — Progress Notes (Signed)
Subjective:    Patient ID: [name], female    DOB: [birth year], 82 y.o.   MRN: [id number]  HPI  82 year old patient who is seen today following left total knee replacement following a failed left TKA in the past.  She has done remarkably well postoperatively and was up and about shopping with her daughter earlier today She has a history of colon cancer and is status post partial colectomy and colostomy reversal.  She was seen by oncology last week. She has a history of persistent atrial fibrillation, diastolic heart failure and coronary artery disease and is followed by cardiology.  She remains on furosemide 20 mg 3 times weekly.  There is a slight bump in her creatinine last week.  She is scheduled to see cardiology with repeat electrolytes and a couple of weeks She is very pleased with her progress He has essential hypertension which has been stable  Past Medical History  Diagnosis Date  . CAD (coronary artery disease)     a. LHC 4/09: pLAD 40, mDx 70-75, pLCx 30, mLCx 50, mRCA 90, EF 60% >> PCI: BMS x2 to RCA;  b. Myoview 8/12: normal  . Essential hypertension   . Chronic diastolic CHF (congestive heart failure) (HCC)     a. Echo 912 - Mild LVH, EF 55-60%, no RWMA, Gr 1 DD, mild MR, mild LAE, mild RAE, PASP 59 mmHg (mod to severe pulmo HTN)  . History of TIA (transient ischemic attack)     a. Head CT 6/15: small chronic lacunar infarct in thalamus  . Hyperlipidemia   . Depression   . COPD with chronic bronchitis ([place])   . GERD (gastroesophageal reflux disease)   . History of hiatal hernia   . History of DVT of lower extremity   . Sigmoid diverticulosis     s/p sigmoid colectomy  . Colovaginal fistula     s/p colostomy >> colostomy takedown 5/16  . History of adenomatous polyp of colon   . Renal insufficiency   . OA (osteoarthritis)   . Wears glasses   . Wears dentures   . Colostomy in place [facility])     s/p colostomy takedown 5/16  . Peripheral neuropathy ([place])   .  Chronic anemia   . History of blood transfusion   . Allergy   . Anxiety   . Cataract     bil cateracts removed  . Pulmonary HTN ([place])     a. PASP on Echo in 9/12:  59 mmHg  . Dysrhythmia     A fib  . Stroke ([place])     TIAs  . Pneumonia   . Adenocarcinoma, colon [facility]) Oncologist-- Dr Truitt Merle    Multifocal (2) Colon cancer @ ileocecal valve and ascending ( [id number]), Stage IIIB, grade I, MMR normal , 2 of 16 +lymph nodes, negative surgical margins---  06-02-2014  Right hemicolectomy w/ colostomy    Social History   Social History  . Marital Status: Married    Spouse Name: N/A  . Number of Children: N/A  . Years of Education: N/A   Occupational History  . Not on file.   Social History Main Topics  . Smoking status: Former Smoker -- 0.50 packs/day for 15 years    Types: Cigarettes    Quit date: 07/16/1967  . Smokeless tobacco: Never Used  . Alcohol Use: No  . Drug Use: No  . Sexual Activity: Not on file   Other Topics Concern  . Not on file  Social History Narrative   Married, 2 children.  As of November 2015 her husband has been living in a nursing home for tendon after years as he suffers from multiple medical problems.   Patient does not drink alcohol nor does she smoke or chew tobacco products   Right handed   8th grade   1 cup daily    Past Surgical History  Procedure Laterality Date  . Appendectomy    . Cholecystectomy    . Abdominal hysterectomy    . Lumbar laminectomy  10-29-2002,  1969    Left  L3 -- 4  decompression  . Total knee arthroplasty Bilateral left 1994/  right 2000  . Rotator cuff repair Bilateral   . Cataract extraction w/ intraocular lens  implant, bilateral Bilateral   . Tonsillectomy    . Dilation and curettage of uterus    . Patellectomy Right 09/14/2013    Procedure: PATELLECTOMY;  Surgeon: Meredith Pel, MD;  Location: [place];  Service: Orthopedics;  Laterality: Right;  . Partial colectomy N/A 06/02/2014    Procedure: RIGHT  PARTIAL COLECTOMY ;  Surgeon: Donnie Mesa, MD;  Location: [place];  Service: General;  Laterality: N/A;  . Colostomy N/A 06/02/2014    Procedure: DIVERTING DESCENDING END COLOSTOMY;  Surgeon: Donnie Mesa, MD;  Location: [place];  Service: General;  Laterality: N/A;  . Revision total knee arthroplasty Bilateral right  04-02-2011/  left 1996 & 10-05-1999  . Anterior cervical decomp/discectomy fusion  01-31-2009    C5 -- 7  . Cardiovascular stress test  02-26-2011    Normal lexiscan no exercise study/  no ischemia/  normal LV function and wall motion, ef 67%  . Transthoracic echocardiogram  12-01-2009    Grade I diastolic dysfunction/  ef 60%/  moderate MR/  mild TR  . Evaluation under anesthesia with anal fistulectomy N/A 10/13/2014    Procedure: ANAL EXAM UNDER ANESTHESIA ;  Surgeon: [name], MD;  Location: [facility];  Service: General;  Laterality: N/A;  . Proctoscopy N/A 10/13/2014    Procedure: RIDGE PROCTOSCOPY;  Surgeon: [name], MD;  Location: [facility];  Service: General;  Laterality: N/A;  . Coronary angioplasty with stent placement  11-04-2007  dr [email or web address]    BMS x2 to RCA/  mild Non-obstructive disease LAD/  normal LVF  . Bone spurs Bilateral     Feet  . Hand surgery      Tendon repair  . Carpal tunnel release Right   . Laparoscopic sigmoid colectomy N/A 11/24/2014    Procedure: SIGMOID COLECTOMY AND COLSOTOMY CLOSURE;  Surgeon: Donnie Mesa, MD;  Location: [place];  Service: General;  Laterality: N/A;  . Total knee revision Left 08/02/2015    Procedure: LEFT FEMORAL REVISION;  Surgeon: Gaynelle Arabian, MD;  Location: [facility];  Service: Orthopedics;  Laterality: Left;    Family History  Problem Relation Age of Onset  . Osteoarthritis Mother   . Heart failure Mother   . Cancer Sister 35    ? colon cancer   . Cancer Brother 30    Kidney cancer   . Cancer Maternal Uncle 38    Colon cancer   . Colon cancer Maternal Uncle   . Esophageal cancer Neg Hx   . Rectal cancer Neg Hx    . Stomach cancer Neg Hx     No Known Allergies  Current Outpatient Prescriptions on File Prior to Visit  Medication Sig Dispense Refill  . acetaminophen (TYLENOL) 325 MG tablet Take  650 mg by mouth every 6 (six) hours as needed for moderate pain.     Marland Kitchen apixaban (ELIQUIS) 5 MG TABS tablet Take 1 tablet (5 mg total) by mouth 2 (two) times daily. 180 tablet 3  . docusate sodium (COLACE) 100 MG capsule Take 1 capsule (100 mg total) by mouth 2 (two) times daily. 60 capsule 0  . esomeprazole (NEXIUM) 20 MG capsule Take 20 mg by mouth daily at 12 noon.    . ferrous sulfate 325 (65 FE) MG tablet Take 1 tablet (325 mg total) by mouth 2 (two) times daily with a meal.  3  . furosemide (LASIX) 20 MG tablet Take 20 mg daily today and tomorrow then decrease to 20 mg daily on Mondays, Wednesdays and Fridays 20 tablet 3  . gabapentin (NEURONTIN) 600 MG tablet Take 1 tablet by mouth 3 (three) times daily.    Marland Kitchen LORazepam (ATIVAN) 0.5 MG tablet Take 1 tablet (0.5 mg total) by mouth 2 (two) times daily. 20 tablet 0  . methocarbamol (ROBAXIN) 500 MG tablet Take 1 tablet (500 mg total) by mouth every 6 (six) hours as needed for muscle spasms. 80 tablet 0  . metoprolol (LOPRESSOR) 100 MG tablet Take 1 tablet (100 mg total) by mouth 2 (two) times daily. 180 tablet 3  . metoprolol (LOPRESSOR) 50 MG tablet Reported on 09/01/2015    . nitroGLYCERIN (NITROSTAT) 0.4 MG SL tablet Place 0.4 mg under the tongue every 5 (five) minutes as needed for chest pain (x 3 pills). Reported on 09/01/2015    . Polyethyl Glycol-Propyl Glycol (SYSTANE OP) Place 1 drop into both eyes 2 (two) times daily as needed (for dry eyes).    . promethazine (PHENERGAN) 12.5 MG tablet Take 1 tablet by mouth every 6 (six) hours as needed for nausea or vomiting. Nausea/vomitting    . sertraline (ZOLOFT) 50 MG tablet Take 1 tablet by mouth daily. Reported on 07/25/2015    . traMADol (ULTRAM) 50 MG tablet Take 1-2 tablets (50-100 mg total) by mouth every 6  (six) hours as needed (mild pain). 80 tablet 1  . traZODone (DESYREL) 100 MG tablet Take 50 mg by mouth at bedtime. Take one half tablet (50 mg) as needed at bedtime for sleep    . triamcinolone cream (KENALOG) 0.1 % Apply 1 application topically 2 (two) times daily as needed (Eczema on legs). Reported on 07/25/2015     No current facility-administered medications on file prior to visit.    BP 110/70 mmHg  Pulse 108  Temp(Src) 97.6 F (36.4 C) (Oral)  Resp 20  Ht _0  (1.6 m)  Wt 179 lb (81.194 kg)  BMI 31.72 kg/m2  SpO2 97%     Review of Systems  Constitutional: Negative.   HENT: Negative for congestion, dental problem, hearing loss, rhinorrhea, sinus pressure, sore throat and tinnitus.   Eyes: Negative for pain, discharge and visual disturbance.  Respiratory: Negative for cough and shortness of breath.   Cardiovascular: Negative for chest pain, palpitations and leg swelling.  Gastrointestinal: Negative for nausea, vomiting, abdominal pain, diarrhea, constipation, blood in stool and abdominal distention.  Genitourinary: Negative for dysuria, urgency, frequency, hematuria, flank pain, vaginal bleeding, vaginal discharge, difficulty urinating, vaginal pain and pelvic pain.  Musculoskeletal: Positive for back pain, arthralgias and gait problem. Negative for joint swelling.  Skin: Negative for rash.  Neurological: Negative for dizziness, syncope, speech difficulty, weakness, numbness and headaches.  Hematological: Negative for adenopathy.  Psychiatric/Behavioral: Negative for behavioral problems, dysphoric mood  and agitation. The patient is not nervous/anxious.        Objective:   Physical Exam  Constitutional: She is oriented to person, place, and time. She appears well-developed and well-nourished.  Alert, no distress.  Blood pressure 110/70  Using a walker    HENT:  Head: Normocephalic.  Right Ear: External ear normal.  Left Ear: External ear normal.  Mouth/Throat:  Oropharynx is clear and moist.  Eyes: Conjunctivae and EOM are normal. Pupils are equal, round, and reactive to light.  Neck: Normal range of motion. Neck supple. No thyromegaly present.  Cardiovascular: Normal rate, regular rhythm, normal heart sounds and intact distal pulses.   Pulmonary/Chest: Effort normal and breath sounds normal.  Abdominal: Soft. Bowel sounds are normal. She exhibits no mass. There is no tenderness.  Musculoskeletal: Normal range of motion. She exhibits no edema.  Lymphadenopathy:    She has no cervical adenopathy.  Neurological: She is alert and oriented to person, place, and time.  Skin: Skin is warm and dry. No rash noted.  Psychiatric: She has a normal mood and affect. Her behavior is normal.          Assessment & Plan:   Essential hypertension, well-controlled Diastolic heart failure Atrial fibrillation.  Continue anticoagulation History colon cancer.  Follow-up oncology  Follow cardiology with electrolytes in 2 weeks.  Return here in 4 months or as needed

## 2015-09-05 NOTE — Patient Instructions (Signed)
Limit your sodium (Salt) intake  Please check your blood pressure on a regular basis.  If it is consistently greater than 150/90, please make an office appointment.  Return in 4 months for follow-up  

## 2015-09-07 ENCOUNTER — Other Ambulatory Visit: Payer: Self-pay

## 2015-09-07 NOTE — Patient Outreach (Signed)
[facility]) Care Management  09/07/2015  [name] [birth year] [id number]   Telephone Screen  Referral Date: 09/07/15 Referral Source: [facility] Tier 4 list Referral Reason: "CHF with 1 ED visit and 1 admit"   Outreach attempt # 1 to patient. Patient reached.   Social: patient resides in her home alone. She has adult children who check on her and assist as needed. Patient relies on family to transport her to medical appts.She is using walker. Denies any falls. Pain controlled at present.  Conditions; She has h/o: CHF, HTN,CAD, [id number]), colon CA s/p partial colectomy and colostomy reversal and left total knee replacement. Patient states she is doing well from recovering from knee surgery on last month.   Medications: patient states she takes less than 10 meds. She uses [employer] order for most of her meds.   Appointments: She saw PCP on 2/21 and cardiologist on 2/10. She states she had f/u appt with surgeon on today and it went well.  Consent: patient states she does not need [facility] services at this time. She was appreciative of call but declined services. She did agree to [employer] CM mailing out [facility] brochure and contact info for future reference.   Plan: RN CM will notify [facility] administrative assistant of case closure.  RN CM will send [facility] brochure and info to patient. RN CM will send MD case closure letter.  Enzo Montgomery, RN,BSN,CCM [place] Management Telephonic Care Management Coordinator Direct Phone: [phone] Toll Free: [phone] Fax: [phone]

## 2015-09-18 ENCOUNTER — Encounter: Payer: Self-pay | Admitting: *Deleted

## 2015-09-20 NOTE — Progress Notes (Signed)
Cardiology Office Note:    Date:  09/21/2015   ID:  [name], DOB 07/04/1930, MRN [id number]  PCP:  Nyoka Cowden, MD  Cardiologist:  Dr. Lauree Chandler   Electrophysiologist:  n/a  Chief Complaint  Patient presents with  . Atrial Fibrillation    Follow up    History of Present Illness:     [name] is a 82 y.o. female with a hx of CAD s/p BMS to RCA x 2 in 6659, diastolic HF, HTN, HL, COPD, pulmonary HTN, ? prior TIA, prior DVT, colon CA. Colon cancer diagnosed in 11/15. She underwent right hemicolectomy with diverting colostomy. She saw Dr. Domenic Polite in 5/16 and was cleared from a cardiac perspective for colostomy takedown and sigmoid colectomy for diverticulosis. She was seen by Lyda Jester, PA-C 12/16 for surgical clearance (for L TKR). ECG was noted to demonstrate new onset AFib with controlled VR. She was cleared for surgery and anticoagulation could be addressed after surgery. CHADS2-VASc=7 (age, gender, vascular disease, HTN, ?TIA - head CT in past with prior lacunar infarcts). I saw her after her colonoscopy (surveillance for Colon CA treatment) was cancelled due to uncontrolled HR.   She underwent L femoral revision of TKR 08/02/15 with Dr. Wynelle Link.  I saw her in FU 2/17.  I placed her on Eliquis for anticoagulation.  I adjusted her beta-blocker for rate control.  She returns for FU (eye towards DCCV if felt to be symptomatic).    Here with her daughter. She is doing well.  She notes DOE. This is chronic and stable. She denies orthopnea, PND.  Denies any LE edema.  She denies exertional CP.  Has noted some heaviness in her chest sometimes when she lays flat.  Denies syncope.  Energy level is actually improved since recovering from knee surgery.  She decided to pull weeds last week (hasn't done this in a long time).     Past Medical History  Diagnosis Date  . CAD (coronary artery disease)     a. LHC 4/09: pLAD 40, mDx 70-75, pLCx 30, mLCx 50,  mRCA 90, EF 60% >> PCI: BMS x2 to RCA;  b. Myoview 8/12: normal  . Essential hypertension   . Chronic diastolic CHF (congestive heart failure) (HCC)     a. Echo 912 - Mild LVH, EF 55-60%, no RWMA, Gr 1 DD, mild MR, mild LAE, mild RAE, PASP 59 mmHg (mod to severe pulmo HTN)  . History of TIA (transient ischemic attack)     a. Head CT 6/15: small chronic lacunar infarct in thalamus  . Hyperlipidemia   . Depression   . COPD with chronic bronchitis ([place])   . GERD (gastroesophageal reflux disease)   . History of hiatal hernia   . History of DVT of lower extremity   . Sigmoid diverticulosis     s/p sigmoid colectomy  . Colovaginal fistula     s/p colostomy >> colostomy takedown 5/16  . History of adenomatous polyp of colon   . Renal insufficiency   . OA (osteoarthritis)   . Wears glasses   . Wears dentures   . Colostomy in place [facility])     s/p colostomy takedown 5/16  . Peripheral neuropathy ([place])   . Chronic anemia   . History of blood transfusion   . Allergy   . Anxiety   . Cataract     bil cateracts removed  . Pulmonary HTN ([place])     a. PASP on Echo in 9/12:  59 mmHg  . Dysrhythmia     A fib  . Stroke ([place])     TIAs  . Pneumonia   . Adenocarcinoma, colon [facility]) Oncologist-- Dr Truitt Merle    Multifocal (2) Colon cancer @ ileocecal valve and ascending ( [id number]), Stage IIIB, grade I, MMR normal , 2 of 16 +lymph nodes, negative surgical margins---  06-02-2014  Right hemicolectomy w/ colostomy    Past Surgical History  Procedure Laterality Date  . Appendectomy    . Cholecystectomy    . Abdominal hysterectomy    . Lumbar laminectomy  10-29-2002,  1969    Left  L3 -- 4  decompression  . Total knee arthroplasty Bilateral left 1994/  right 2000  . Rotator cuff repair Bilateral   . Cataract extraction w/ intraocular lens  implant, bilateral Bilateral   . Tonsillectomy    . Dilation and curettage of uterus    . Patellectomy Right 09/14/2013    Procedure: PATELLECTOMY;  Surgeon:  Meredith Pel, MD;  Location: [facility];  Service: Orthopedics;  Laterality: Right;  . Partial colectomy N/A 06/02/2014    Procedure: RIGHT PARTIAL COLECTOMY ;  Surgeon: Donnie Mesa, MD;  Location: [place];  Service: General;  Laterality: N/A;  . Colostomy N/A 06/02/2014    Procedure: DIVERTING DESCENDING END COLOSTOMY;  Surgeon: Donnie Mesa, MD;  Location: [place];  Service: General;  Laterality: N/A;  . Revision total knee arthroplasty Bilateral right  04-02-2011/  left 1996 & 10-05-1999  . Anterior cervical decomp/discectomy fusion  01-31-2009    C5 -- 7  . Cardiovascular stress test  02-26-2011    Normal lexiscan no exercise study/  no ischemia/  normal LV function and wall motion, ef 67%  . Transthoracic echocardiogram  12-01-2009    Grade I diastolic dysfunction/  ef 60%/  moderate MR/  mild TR  . Evaluation under anesthesia with anal fistulectomy N/A 10/13/2014    Procedure: ANAL EXAM UNDER ANESTHESIA ;  Surgeon: [name], MD;  Location: [facility];  Service: General;  Laterality: N/A;  . Proctoscopy N/A 10/13/2014    Procedure: RIDGE PROCTOSCOPY;  Surgeon: [name], MD;  Location: [facility];  Service: General;  Laterality: N/A;  . Coronary angioplasty with stent placement  11-04-2007  dr [email or web address]    BMS x2 to RCA/  mild Non-obstructive disease LAD/  normal LVF  . Bone spurs Bilateral     Feet  . Hand surgery      Tendon repair  . Carpal tunnel release Right   . Laparoscopic sigmoid colectomy N/A 11/24/2014    Procedure: SIGMOID COLECTOMY AND COLSOTOMY CLOSURE;  Surgeon: Donnie Mesa, MD;  Location: [place];  Service: General;  Laterality: N/A;  . Total knee revision Left 08/02/2015    Procedure: LEFT FEMORAL REVISION;  Surgeon: Gaynelle Arabian, MD;  Location: [facility];  Service: Orthopedics;  Laterality: Left;    Current Medications:   Outpatient Prescriptions Prior to Visit  Medication Sig Dispense Refill  . acetaminophen (TYLENOL) 325 MG tablet Take 650 mg by mouth every 6 (six)  hours as needed for moderate pain.     Marland Kitchen apixaban (ELIQUIS) 5 MG TABS tablet Take 1 tablet (5 mg total) by mouth 2 (two) times daily. 180 tablet 3  . esomeprazole (NEXIUM) 20 MG capsule Take 20 mg by mouth daily at 12 noon.    . ferrous sulfate 325 (65 FE) MG tablet Take 1 tablet (325 mg total) by mouth 2 (two) times daily with a  meal.  3  . furosemide (LASIX) 20 MG tablet Take 20 mg daily today and tomorrow then decrease to 20 mg daily on Mondays, Wednesdays and Fridays 20 tablet 3  . gabapentin (NEURONTIN) 600 MG tablet Take 1 tablet by mouth 3 (three) times daily.    Marland Kitchen HYDROcodone-acetaminophen (NORCO/VICODIN) 5-325 MG tablet Take 1 tablet by mouth every 6 (six) hours as needed for moderate pain. 60 tablet 0  . LORazepam (ATIVAN) 0.5 MG tablet Take 1 tablet (0.5 mg total) by mouth 2 (two) times daily. 20 tablet 0  . methocarbamol (ROBAXIN) 500 MG tablet Take 1 tablet (500 mg total) by mouth every 6 (six) hours as needed for muscle spasms. 80 tablet 0  . metoprolol (LOPRESSOR) 100 MG tablet Take 1 tablet (100 mg total) by mouth 2 (two) times daily. 180 tablet 3  . nitroGLYCERIN (NITROSTAT) 0.4 MG SL tablet Place 0.4 mg under the tongue every 5 (five) minutes as needed for chest pain (x 3 pills). Reported on 09/01/2015    . Polyethyl Glycol-Propyl Glycol (SYSTANE OP) Place 1 drop into both eyes 2 (two) times daily as needed (for dry eyes).    . promethazine (PHENERGAN) 12.5 MG tablet Take 1 tablet by mouth every 6 (six) hours as needed for nausea or vomiting. Nausea/vomitting    . sertraline (ZOLOFT) 50 MG tablet Take 1 tablet by mouth daily. Reported on 07/25/2015    . traMADol (ULTRAM) 50 MG tablet Take 1-2 tablets (50-100 mg total) by mouth every 6 (six) hours as needed (mild pain). 80 tablet 1  . traZODone (DESYREL) 100 MG tablet Take 50 mg by mouth at bedtime. Take one half tablet (50 mg) as needed at bedtime for sleep    . triamcinolone cream (KENALOG) 0.1 % Apply 1 application topically 2 (two)  times daily as needed (Eczema on legs). Reported on 07/25/2015    . docusate sodium (COLACE) 100 MG capsule Take 1 capsule (100 mg total) by mouth 2 (two) times daily. 60 capsule 0  . metoprolol (LOPRESSOR) 50 MG tablet Reported on 09/21/2015     No facility-administered medications prior to visit.     Allergies:   Review of patient's allergies indicates no known allergies.   Social History   Social History  . Marital Status: Married    Spouse Name: N/A  . Number of Children: N/A  . Years of Education: N/A   Social History Main Topics  . Smoking status: Former Smoker -- 0.50 packs/day for 15 years    Types: Cigarettes    Quit date: 07/16/1967  . Smokeless tobacco: Never Used  . Alcohol Use: No  . Drug Use: No  . Sexual Activity: Not Asked   Other Topics Concern  . None   Social History Narrative   Married, 2 children.  As of November 2015 her husband has been living in a nursing home for tendon after years as he suffers from multiple medical problems.   Patient does not drink alcohol nor does she smoke or chew tobacco products   Right handed   8th grade   1 cup daily     Family History:  The patient's family history includes Cancer (age of onset: 13) in her sister; Colon cancer in her maternal uncle; Colon cancer (age of onset: 17) in her maternal uncle; Heart Problems in her father; Heart failure in her mother; Kidney cancer (age of onset: 61) in her brother; Osteoarthritis in her mother. There is no history of Esophageal cancer, Rectal  cancer, or Stomach cancer.   ROS:   Please see the history of present illness.    Review of Systems  HENT: Positive for headaches.   Hematologic/Lymphatic: Bruises/bleeds easily.  All other systems reviewed and are negative.   Physical Exam:    VS:  BP 128/62 mmHg  Pulse 82  Ht _0  (1.6 m)  Wt 176 lb 12.8 oz (80.196 kg)  BMI 31.33 kg/m2   GEN: Well nourished, well developed, in no acute distress HEENT: normal Neck: no JVD, no  masses Cardiac: Normal S1/S2, irreg irreg rhythm; no murmurs, very trace bilateral LE edema   Respiratory:  clear to auscultation bilaterally; no wheezing, rhonchi or rales GI: soft, nontender  MS: anterior scar L knee Skin: warm and dry  Neuro: no focal deficits  Psych: Alert and oriented x 3, normal affect  Wt Readings from Last 3 Encounters:  09/21/15 176 lb 12.8 oz (80.196 kg)  09/05/15 179 lb (81.194 kg)  09/01/15 182 lb 3.2 oz (82.645 kg)      Studies/Labs Reviewed:     EKG:  EKG is  ordered today.  The ekg ordered today demonstrates AFib, HR 81   Recent Labs: 09/01/2015: ALT 18 09/21/2015: BUN 35*; Creat 1.43*; Hemoglobin 12.3; Platelets 221; Potassium 5.1; Sodium 139   Recent Lipid Panel    Component Value Date/Time   CHOL 130 01/19/2013 1014   TRIG 87.0 01/19/2013 1014   HDL 33.60* 01/19/2013 1014   CHOLHDL 4 01/19/2013 1014   VLDL 17.4 01/19/2013 1014   LDLCALC 79 01/19/2013 1014    Additional studies/ records that were reviewed today include:   Echo 07/27/15 Mild LVH, EF 50-55%, no RWMA, mod MR, severe LAE, mild RAE, PASP 44 mmHg  Echo 9/12 Mild LVH, EF 55-60%, no RWMA, Gr 1 DD, mild MR, mild LAE, mild RAE, PASP 59 mmHg (mod to severe pulmo HTN)  Myoview 8/12 Normal stress nuclear study  LHC 4/09 LM ok LAD calcified, prox and mid 40%, Dx mid 70-75% LCx prox 30%, mid AV groove 50% RCA mid 90% EF 60% PCI: [facility] BMS x 2 to mRCA   ASSESSMENT:     1. Persistent atrial fibrillation ([place])   2. Coronary artery disease involving native coronary artery of native heart without angina pectoris   3. Chronic diastolic CHF (congestive heart failure) ([place])   4. Essential hypertension   5. Other chest pain     PLAN:     In order of problems listed above:  1. Persistent AFib - She remains in AFib.  CHADS2-VASc=7. She needs to remain on long term anticoagulation. Continue Eliquis 5 mg bid, Metoprolol Tartrate to 100 mg bid.  We discussed pros and cons of  DCCV.  I am not confident that she will maintain long term NSR given LAE. I am also not convinced she is significantly symptomatic.  She is not interested in DCCV at this time.  I agree with this decision.  If she feels more symptomatic in the future, we can reconsider.    -  BMET, CBC today.   2. CAD - No recurrent angina. Recent Echo with normal LVF and normal wall motion. Continue beta blocker. No ASA as she is on Eliquis.   3. Diastolic HF - Volume stable.  Continue current Rx.   4. HTN - BP controlled.   5. Chest pain - Atypical.  Question from GERD as it only happens with laying flat.  She can take Nexium 40 mg QD x 1  week, then resume 20 mg QD.     Medication Adjustments/Labs and Tests Ordered: Current medicines are reviewed at length with the patient today.  Concerns regarding medicines are outlined above.  Medication changes, Labs and Tests ordered today are outlined in the Patient Instructions noted below. Patient Instructions  Medication Instructions:   START TAKING NEXIUM 40 MG ONCE A DAY FOR A WEEK THE GO BACK  20 MG ONCE A DAY   If you need a refill on your cardiac medications before your next appointment, please call your pharmacy.  Labwork:  BMET AND CBC  Testing/Procedures: NONE ORDER TODAY  Follow-Up:  WITH [facility] AS PLANNED   Any Other Special Instructions Will Be Listed Below (If Applicable).  Signed, Richardson Dopp, PA-C  09/21/2015 5:44 PM    [facility] [address], [place], [place]  [address] Phone: [phone]; Fax: [phone]

## 2015-09-21 ENCOUNTER — Ambulatory Visit: Payer: Commercial Managed Care - HMO | Admitting: Physician Assistant

## 2015-09-21 ENCOUNTER — Encounter: Payer: Self-pay | Admitting: Physician Assistant

## 2015-09-21 ENCOUNTER — Ambulatory Visit (INDEPENDENT_AMBULATORY_CARE_PROVIDER_SITE_OTHER): Payer: Commercial Managed Care - HMO | Admitting: Physician Assistant

## 2015-09-21 ENCOUNTER — Other Ambulatory Visit (INDEPENDENT_AMBULATORY_CARE_PROVIDER_SITE_OTHER): Payer: Commercial Managed Care - HMO | Admitting: *Deleted

## 2015-09-21 VITALS — BP 128/62 | HR 82 | Ht 63.0 in | Wt 176.8 lb

## 2015-09-21 DIAGNOSIS — I5032 Chronic diastolic (congestive) heart failure: Secondary | ICD-10-CM

## 2015-09-21 DIAGNOSIS — I10 Essential (primary) hypertension: Secondary | ICD-10-CM | POA: Diagnosis not present

## 2015-09-21 DIAGNOSIS — I4819 Other persistent atrial fibrillation: Secondary | ICD-10-CM

## 2015-09-21 DIAGNOSIS — I2510 Atherosclerotic heart disease of native coronary artery without angina pectoris: Secondary | ICD-10-CM

## 2015-09-21 DIAGNOSIS — I481 Persistent atrial fibrillation: Secondary | ICD-10-CM

## 2015-09-21 DIAGNOSIS — R0789 Other chest pain: Secondary | ICD-10-CM

## 2015-09-21 LAB — CBC WITH DIFFERENTIAL/PLATELET
Basophils Absolute: 0 10*3/uL (ref 0.0–0.1)
Basophils Relative: 0 % (ref 0–1)
Eosinophils Absolute: 0.1 10*3/uL (ref 0.0–0.7)
Eosinophils Relative: 1 % (ref 0–5)
HCT: 37.8 % (ref 36.0–46.0)
Hemoglobin: 12.3 g/dL (ref 12.0–15.0)
Lymphocytes Relative: 27 % (ref 12–46)
Lymphs Abs: 2 10*3/uL (ref 0.7–4.0)
MCH: 28.7 pg (ref 26.0–34.0)
MCHC: 32.5 g/dL (ref 30.0–36.0)
MCV: 88.3 fL (ref 78.0–100.0)
MPV: 11 fL (ref 8.6–12.4)
Monocytes Absolute: 0.6 10*3/uL (ref 0.1–1.0)
Monocytes Relative: 8 % (ref 3–12)
Neutro Abs: 4.8 10*3/uL (ref 1.7–7.7)
Neutrophils Relative %: 64 % (ref 43–77)
Platelets: 221 10*3/uL (ref 150–400)
RBC: 4.28 MIL/uL (ref 3.87–5.11)
RDW: 14 % (ref 11.5–15.5)
WBC: 7.5 10*3/uL (ref 4.0–10.5)

## 2015-09-21 LAB — BASIC METABOLIC PANEL
BUN: 35 mg/dL — ABNORMAL HIGH (ref 7–25)
CO2: 27 mmol/L (ref 20–31)
Calcium: 9.5 mg/dL (ref 8.6–10.4)
Chloride: 102 mmol/L (ref 98–110)
Creat: 1.43 mg/dL — ABNORMAL HIGH (ref 0.60–0.88)
Glucose, Bld: 95 mg/dL (ref 65–99)
Potassium: 5.1 mmol/L (ref 3.5–5.3)
Sodium: 139 mmol/L (ref 135–146)

## 2015-09-21 NOTE — Patient Instructions (Addendum)
Medication Instructions:   START TAKING NEXIUM 40 MG ONCE A DAY FOR A WEEK THE GO BACK  20 MG ONCE A DAY   If you need a refill on your cardiac medications before your next appointment, please call your pharmacy.  Labwork:  BMET AND CBC  Testing/Procedures: NONE ORDER TODAY  Follow-Up:  WITH [facility] AS PLANNED   Any Other Special Instructions Will Be Listed Below (If Applicable).

## 2015-09-21 NOTE — Addendum Note (Signed)
Addended by: Eulis Foster on: 09/21/2015 10:06 AM   Modules accepted: Orders

## 2015-09-22 ENCOUNTER — Other Ambulatory Visit: Payer: Self-pay | Admitting: Internal Medicine

## 2015-11-01 ENCOUNTER — Other Ambulatory Visit: Payer: Self-pay | Admitting: Internal Medicine

## 2015-11-13 IMAGING — CT CT ABD-PELV W/ CM
1 of 3 series · 14 of 32 positions shown, 19 images · IV contrast (omnipaque)
Comparison: 09/13/2014

CLINICAL DATA: Colon cancer status post resection. History of
colovaginal fistula. Prior appendectomy and hysterectomy.

EXAM:
CT ABDOMEN AND PELVIS WITH CONTRAST
TECHNIQUE: Multidetector CT imaging of the abdomen and pelvis was performed
using the standard protocol following bolus administration of
intravenous contrast.
CONTRAST:  80mL OMNIPAQUE IOHEXOL 300 MG/ML  SOLN

[Series 2: rtn ap with st · axial · 0.86mm/px · z∈[+679,+1064]mm · 14 of 87 slices shown, 19 images]
[im 5/87  soft-tissue]
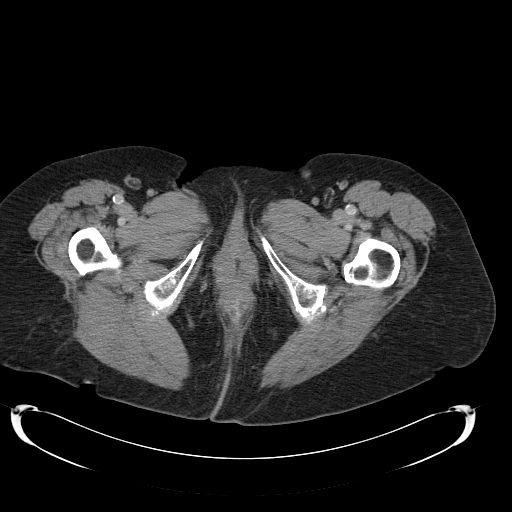
[im 5/87  bone]
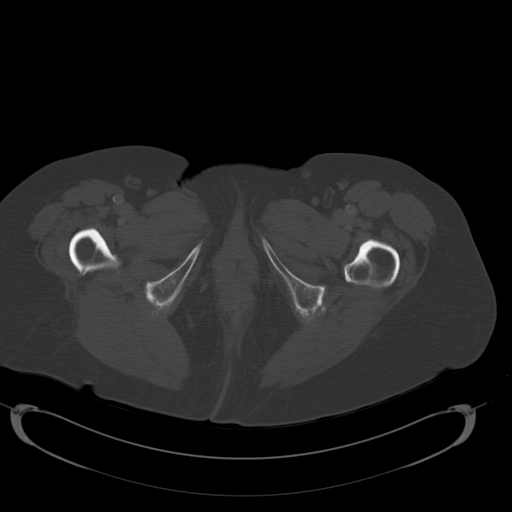
[im 10/87  soft-tissue]
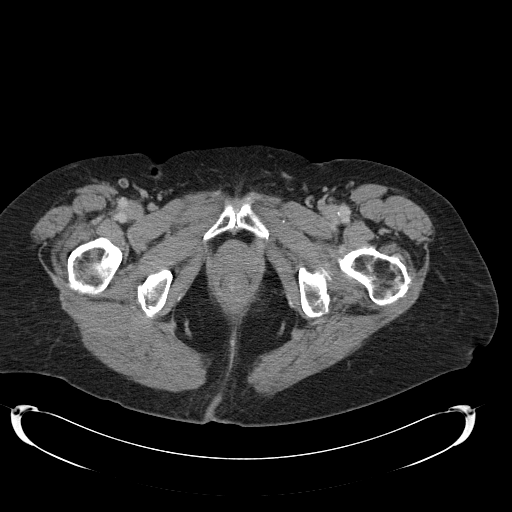
[im 20/87  soft-tissue]
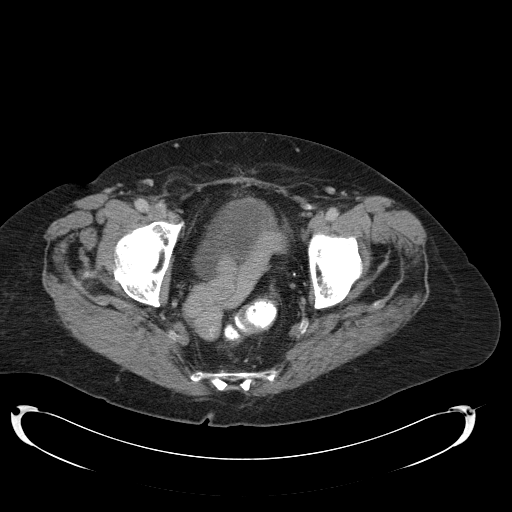
[im 24/87  soft-tissue]
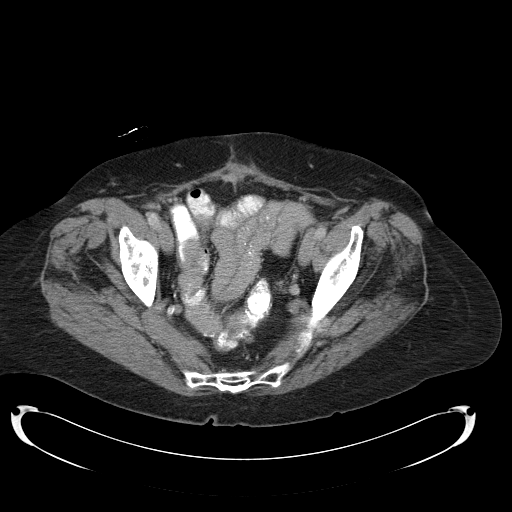
[im 29/87  soft-tissue]
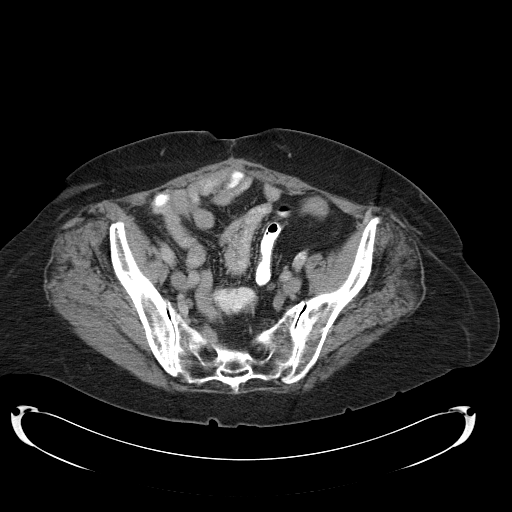
[im 39/87  soft-tissue]
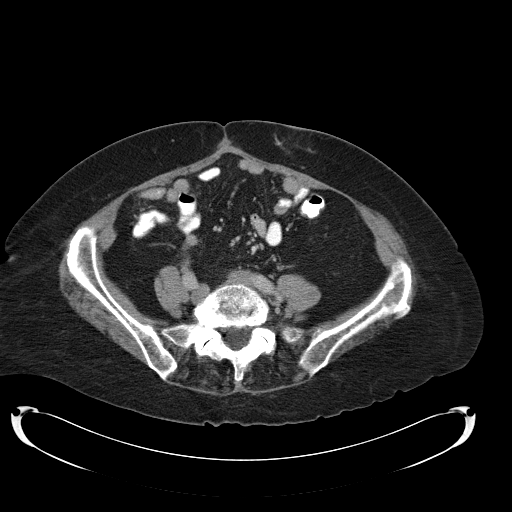
[im 44/87  soft-tissue]
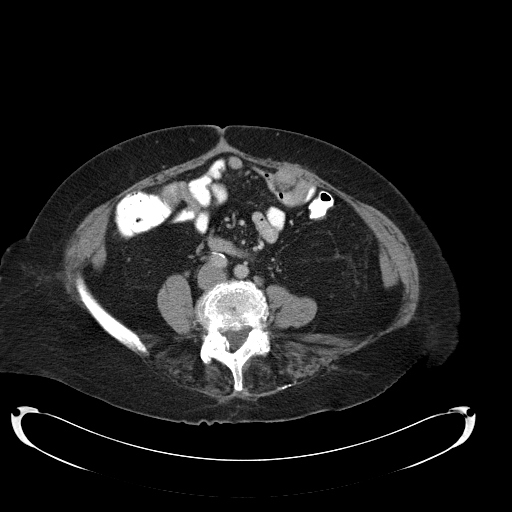
[im 48/87  soft-tissue]
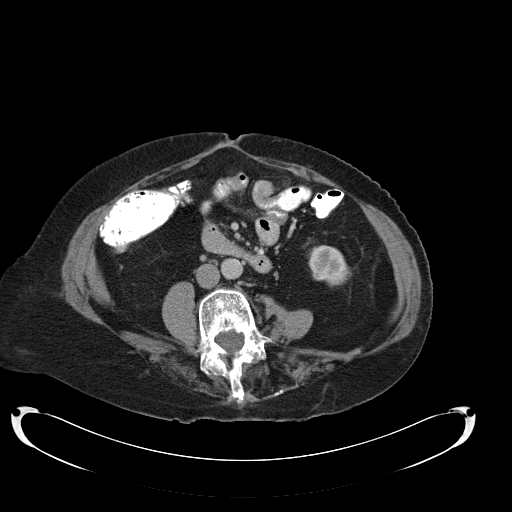
[im 58/87  soft-tissue]
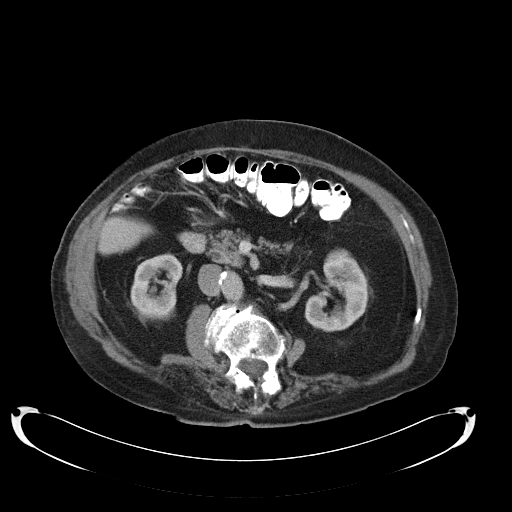
[im 58/87  bone]
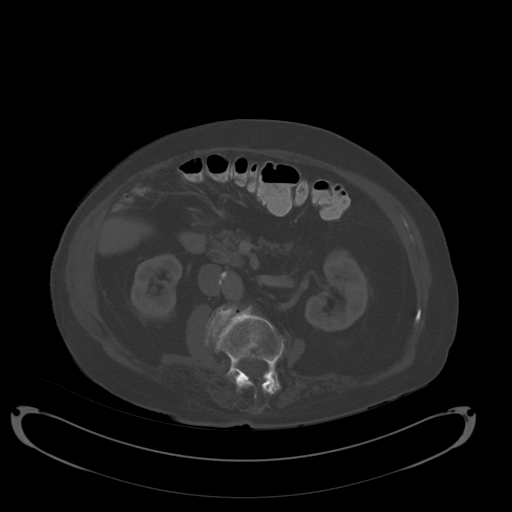
[im 63/87  soft-tissue]
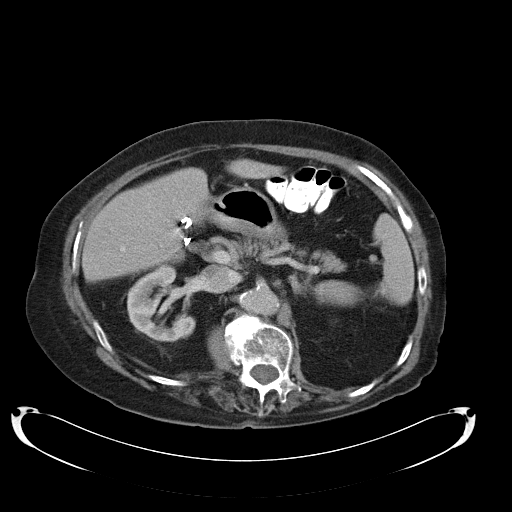
[im 67/87  soft-tissue]
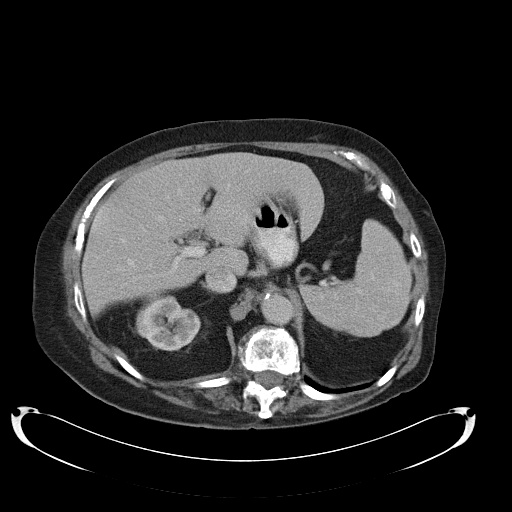
[im 67/87  lung]
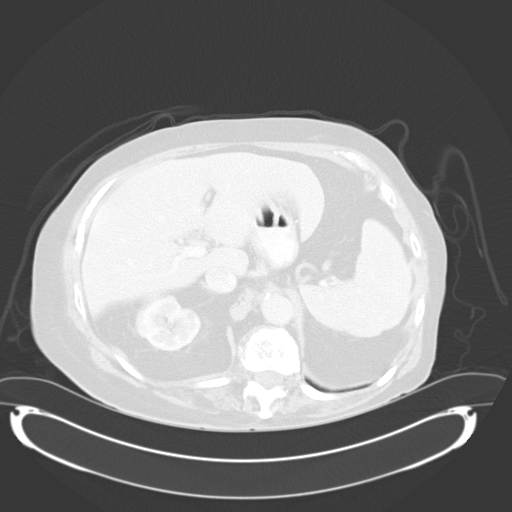
[im 72/87  lung]
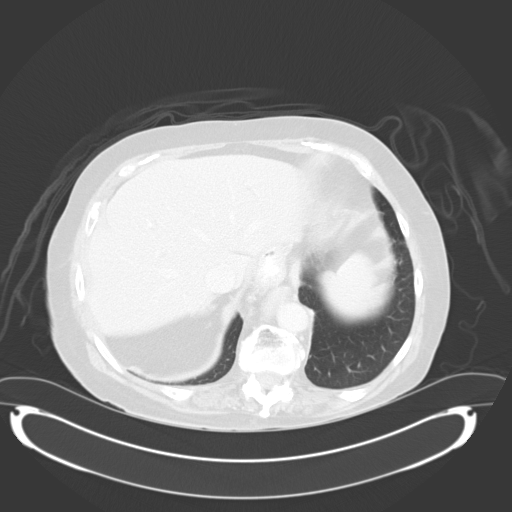
[im 77/87  soft-tissue]
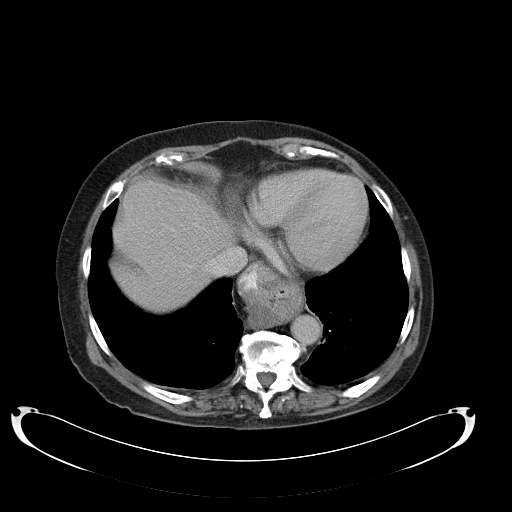
[im 77/87  lung]
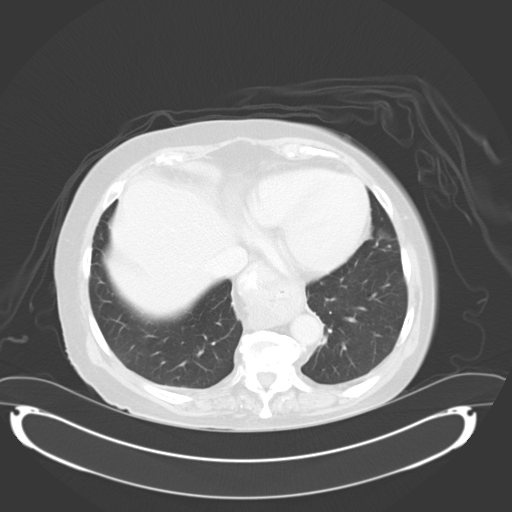
[im 82/87  soft-tissue]
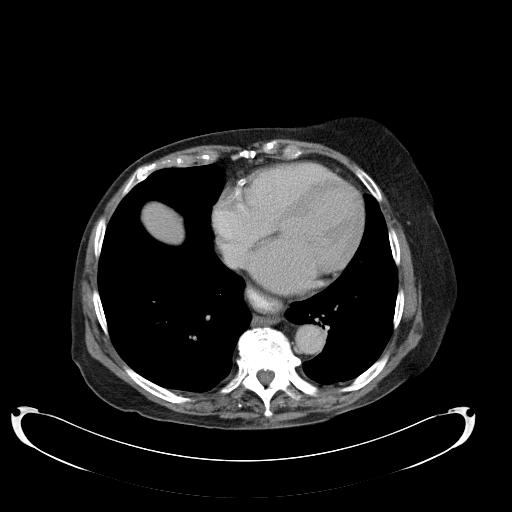
[im 82/87  lung]
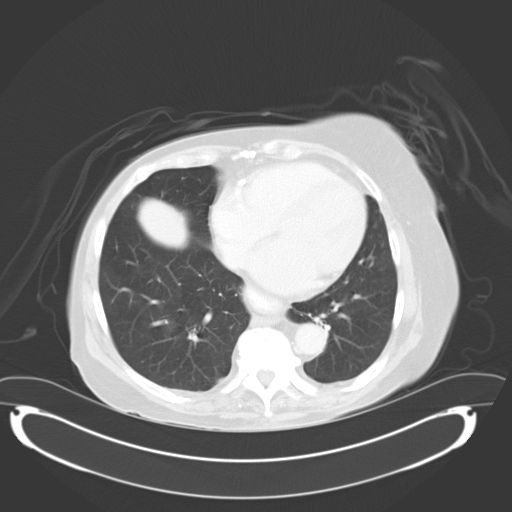

[14 of 32 positions shown; findings below may reference images not displayed]

FINDINGS: Lower chest:  Lung bases are clear.

Coronary atherosclerosis.

Hepatobiliary: Liver is within normal limits. No
suspicious/enhancing hepatic lesions.

Status post cholecystectomy. No intrahepatic or extrahepatic ductal
dilatation.

Pancreas: Within normal limits.

Spleen: Within normal limits.

Adrenals/Urinary Tract: Adrenal glands within normal limits.

Two left renal cysts, measuring up to 12 mm in the left lower pole
(series 5/ image 27). Right kidney is within normal limits. No
hydronephrosis.

Bladder is within normal limits.

Stomach/Bowel: Moderate hiatal hernia/inverted intrathoracic
stomach.

Vascular/Lymphatic: No evidence of bowel obstruction.

Status post ileocecal resection with appendectomy.

Status post left hemicolectomy with anastomosis in the pelvis
(series 2/image 64).

Reproductive: Status post hysterectomy.

Prior rectovaginal fistula is not apparent on the current study.

No adnexal masses.

Other: No abdominopelvic ascites.

Small fat containing bilateral inguinal hernias, right greater than
left (series 2/image 71).

Postsurgical changes in the anterior abdominal wall.

Musculoskeletal: Degenerative changes of the visualized
thoracolumbar spine.
IMPRESSION: Postsurgical changes related to partial right hemicolectomy and
partial left hemicolectomy. No evidence of bowel obstruction.

No findings to suggest recurrent or metastatic disease.

Prior rectovaginal fistulas is not apparent on the current study.

Additional ancillary findings as above.

## 2015-11-14 ENCOUNTER — Ambulatory Visit: Payer: Commercial Managed Care - HMO | Admitting: Internal Medicine

## 2015-11-28 ENCOUNTER — Other Ambulatory Visit: Payer: Self-pay | Admitting: Internal Medicine

## 2015-12-11 NOTE — Progress Notes (Signed)
Patient ID: [name], female   DOB: [birth year], 82 y.o.   MRN: [id number]  Location:  [place] Room Number: [place] of Service:  [phone]) Provider:  Marlowe Sax FNP-C   Blanchie Serve MD  Nyoka Cowden, MD  Patient Care Team: Marletta Lor, MD as PCP - General (Internal Medicine)  Extended Emergency Contact Information Primary Emergency Contact: [name] Address: [address]          [place], [place] [address] [place] Phone: [phone] Mobile Phone: [phone] Relation: Daughter Secondary Emergency Contact: [place], [place] [address] [place] Phone: [phone] Relation: Grandaughter  Code Status:  Full Code  Goals of care: Advanced Directive information Advanced Directives 09/01/2015  Does patient have an advance directive? -  Type of Paramedic of [place];Living will  Does patient want to make changes to advanced directive? -  Copy of advanced directive(s) in chart? No - copy requested     Chief Complaint  Patient presents with  . Acute Visit    Acute Concerns    HPI:  Pt is a 82 y.o. female seen today at [facility] for an acute visit for evaluation of right knee redness. She is S/p left total knee arthroplasty. Facility Nurse reports left knee redness. Patient states knee pain under control with current regimen. She denies any fever or chills.    Past Medical History  Diagnosis Date  . CAD (coronary artery disease)     a. LHC 4/09: pLAD 40, mDx 70-75, pLCx 30, mLCx 50, mRCA 90, EF 60% >> PCI: BMS x2 to RCA;  b. Myoview 8/12: normal  . Essential hypertension   . Chronic diastolic CHF (congestive heart failure) (HCC)     a. Echo 912 - Mild LVH, EF 55-60%, no RWMA, Gr 1 DD, mild MR, mild LAE, mild RAE, PASP 59 mmHg (mod to severe pulmo HTN)  . History of TIA (transient ischemic attack)     a. Head CT  6/15: small chronic lacunar infarct in thalamus  . Hyperlipidemia   . Depression   . COPD with chronic bronchitis ([place])   . GERD (gastroesophageal reflux disease)   . History of hiatal hernia   . History of DVT of lower extremity   . Sigmoid diverticulosis     s/p sigmoid colectomy  . Colovaginal fistula     s/p colostomy >> colostomy takedown 5/16  . History of adenomatous polyp of colon   . Renal insufficiency   . OA (osteoarthritis)   . Wears glasses   . Wears dentures   . Colostomy in place [facility])     s/p colostomy takedown 5/16  . Peripheral neuropathy ([place])   . Chronic anemia   . History of blood transfusion   . Allergy   . Anxiety   . Cataract     bil cateracts removed  . Pulmonary HTN ([place])     a. PASP on Echo in 9/12:  59 mmHg  . Dysrhythmia     A fib  . Stroke ([place])     TIAs  . Pneumonia   . Adenocarcinoma, colon [facility]) Oncologist-- Dr Truitt Merle    Multifocal (2) Colon cancer @ ileocecal valve and ascending ( [id number]), Stage IIIB, grade I, MMR normal , 2 of 16 +lymph nodes, negative surgical margins---  06-02-2014  Right hemicolectomy w/ colostomy  Past Surgical History  Procedure Laterality Date  . Appendectomy    . Cholecystectomy    . Abdominal hysterectomy    . Lumbar laminectomy  10-29-2002,  1969    Left  L3 -- 4  decompression  . Total knee arthroplasty Bilateral left 1994/  right 2000  . Rotator cuff repair Bilateral   . Cataract extraction w/ intraocular lens  implant, bilateral Bilateral   . Tonsillectomy    . Dilation and curettage of uterus    . Patellectomy Right 09/14/2013    Procedure: PATELLECTOMY;  Surgeon: Meredith Pel, MD;  Location: [place];  Service: Orthopedics;  Laterality: Right;  . Partial colectomy N/A 06/02/2014    Procedure: RIGHT PARTIAL COLECTOMY ;  Surgeon: Donnie Mesa, MD;  Location: [place];  Service: General;  Laterality: N/A;  . Colostomy N/A 06/02/2014    Procedure: DIVERTING DESCENDING END COLOSTOMY;  Surgeon: Donnie Mesa, MD;  Location: [place];  Service: General;  Laterality: N/A;  . Revision total knee arthroplasty Bilateral right  04-02-2011/  left 1996 & 10-05-1999  . Anterior cervical decomp/discectomy fusion  01-31-2009    C5 -- 7  . Cardiovascular stress test  02-26-2011    Normal lexiscan no exercise study/  no ischemia/  normal LV function and wall motion, ef 67%  . Transthoracic echocardiogram  12-01-2009    Grade I diastolic dysfunction/  ef 60%/  moderate MR/  mild TR  . Evaluation under anesthesia with anal fistulectomy N/A 10/13/2014    Procedure: ANAL EXAM UNDER ANESTHESIA ;  Surgeon: [name], MD;  Location: [facility];  Service: General;  Laterality: N/A;  . Proctoscopy N/A 10/13/2014    Procedure: RIDGE PROCTOSCOPY;  Surgeon: [name], MD;  Location: [facility];  Service: General;  Laterality: N/A;  . Coronary angioplasty with stent placement  11-04-2007  dr [email or web address]    BMS x2 to RCA/  mild Non-obstructive disease LAD/  normal LVF  . Bone spurs Bilateral     Feet  . Hand surgery      Tendon repair  . Carpal tunnel release Right   . Laparoscopic sigmoid colectomy N/A 11/24/2014    Procedure: SIGMOID COLECTOMY AND COLSOTOMY CLOSURE;  Surgeon: Donnie Mesa, MD;  Location: [place];  Service: General;  Laterality: N/A;  . Total knee revision Left 08/02/2015    Procedure: LEFT FEMORAL REVISION;  Surgeon: Gaynelle Arabian, MD;  Location: [facility];  Service: Orthopedics;  Laterality: Left;    No Known Allergies    Medication List       This list is accurate as of: 08/22/15 11:59 PM.  Always use your most recent med list.               acetaminophen 325 MG tablet  Commonly known as:  TYLENOL  Take 650 mg by mouth every 6 (six) hours as needed for moderate pain.     aspirin 325 MG EC tablet  Take 325 mg by mouth daily. Reported on 08/25/2015     diphenhydrAMINE 12.5 MG/5ML elixir  Commonly known as:  BENADRYL  Take 5-10 mLs (12.5-25 mg total) by mouth every 4 (four) hours as needed for  itching.     docusate sodium 100 MG capsule  Commonly known as:  COLACE  Take 1 capsule (100 mg total) by mouth 2 (two) times daily.     esomeprazole 20 MG capsule  Commonly known as:  NEXIUM  Take 20 mg by mouth daily at 12 noon.  ferrous sulfate 325 (65 FE) MG tablet  Take 1 tablet (325 mg total) by mouth 2 (two) times daily with a meal.     furosemide 20 MG tablet  Commonly known as:  LASIX  Take 20 mg daily today and tomorrow then decrease to 20 mg daily on Mondays, Wednesdays and Fridays     gabapentin 600 MG tablet  Commonly known as:  NEURONTIN  Take 1 tablet by mouth 3 (three) times daily.     LORazepam 0.5 MG tablet  Commonly known as:  ATIVAN  Take 1 tablet (0.5 mg total) by mouth 2 (two) times daily.     methocarbamol 500 MG tablet  Commonly known as:  ROBAXIN  Take 1 tablet (500 mg total) by mouth every 6 (six) hours as needed for muscle spasms.     metoCLOPramide 5 MG tablet  Commonly known as:  REGLAN  Take 1 tablet (5 mg total) by mouth every 8 (eight) hours as needed for nausea (if ondansetron (ZOFRAN) ineffective.).     metoprolol 50 MG tablet  Commonly known as:  LOPRESSOR  Take 1.5 tablets (75 mg total) by mouth 2 (two) times daily.     nitroGLYCERIN 0.4 MG SL tablet  Commonly known as:  NITROSTAT  Place 0.4 mg under the tongue every 5 (five) minutes as needed for chest pain (x 3 pills). Reported on 09/01/2015     ondansetron 4 MG tablet  Commonly known as:  ZOFRAN  Take 1 tablet (4 mg total) by mouth every 6 (six) hours as needed for nausea.     oxyCODONE 5 MG immediate release tablet  Commonly known as:  Oxy IR/ROXICODONE  Take 1-2 tablets (5-10 mg total) by mouth every 3 (three) hours as needed for moderate pain or severe pain.     polyethylene glycol packet  Commonly known as:  MIRALAX / GLYCOLAX  Take 17 g by mouth daily as needed for mild constipation.     promethazine 12.5 MG tablet  Commonly known as:  PHENERGAN  Take 1 tablet by  mouth every 6 (six) hours as needed for nausea or vomiting. Nausea/vomitting     sertraline 50 MG tablet  Commonly known as:  ZOLOFT  Take 1 tablet by mouth daily. Reported on 07/25/2015     sodium phosphate 7-19 GM/118ML Enem  Place 133 mLs (1 enema total) rectally once as needed for severe constipation.     SYSTANE OP  Place 1 drop into both eyes 2 (two) times daily as needed (for dry eyes).     traMADol 50 MG tablet  Commonly known as:  ULTRAM  Take 1-2 tablets (50-100 mg total) by mouth every 6 (six) hours as needed (mild pain).     traZODone 100 MG tablet  Commonly known as:  DESYREL  Take 50 mg by mouth at bedtime. Take one half tablet (50 mg) as needed at bedtime for sleep     triamcinolone cream 0.1 %  Commonly known as:  KENALOG  Apply 1 application topically 2 (two) times daily as needed (Eczema on legs). Reported on 07/25/2015        Review of Systems  Constitutional: Negative for fever, chills, activity change, appetite change and fatigue.  HENT: Negative for congestion, rhinorrhea, sinus pressure, sneezing and sore throat.   Respiratory: Negative for apnea, cough, shortness of breath and wheezing.   Cardiovascular: Negative for chest pain, palpitations and leg swelling.  Gastrointestinal: Negative for nausea, vomiting, abdominal pain, diarrhea, constipation and abdominal  distention.  Genitourinary: Negative for dysuria, urgency, frequency and flank pain.  Musculoskeletal: Positive for gait problem.       Left knee swelling  Skin: Negative.   Psychiatric/Behavioral: Negative.     Immunization History  Administered Date(s) Administered  . Influenza Split 06/21/2011, 04/21/2012  . Influenza Whole 05/19/2007, 04/05/2008, 04/26/2010  . Influenza, High Dose Seasonal PF 05/17/2015  . Influenza,inj,Quad PF,36+ Mos 03/10/2013  . Influenza-Unspecified 04/14/2014  . PPD Test 06/10/2014, 06/24/2014, 08/18/2015  . Pneumococcal Conjugate-13 05/17/2015  . Pneumococcal  Polysaccharide-23 02/10/2013  . Tdap 11/19/2010   Pertinent  Health Maintenance Due  Topic Date Due  . DEXA SCAN  08/31/2018 (Originally 03/11/1997)  . INFLUENZA VACCINE  02/13/2016  . PNA vac Low Risk Adult  Completed   Fall Risk  11/15/2014 08/25/2014 02/10/2013  Falls in the past year? No Yes Yes  Number falls in past yr: - 2 or more 2 or more  Injury with Fall? - Yes -  Risk Factor Category  - High Fall Risk High Fall Risk  Risk for fall due to : - Impaired balance/gait Impaired balance/gait   Functional Status Survey:    Filed Vitals:   08/22/15 1458  BP: 130/72  Pulse: 80  Temp: 97.4 F (36.3 C)  TempSrc: Oral  Resp: 18  Height: '5\' 3"'$  (1.6 m)  SpO2: 95%   There is no weight on file to calculate BMI. Physical Exam  Constitutional: She is oriented to person, place, and time. She appears well-developed and well-nourished. No distress.  HENT:  Head: Normocephalic.  Mouth/Throat: Oropharynx is clear and moist.  Eyes: Conjunctivae and EOM are normal. Pupils are equal, round, and reactive to light. Right eye exhibits no discharge. Left eye exhibits no discharge. No scleral icterus.  Neck: Normal range of motion. No JVD present. No thyromegaly present.  Cardiovascular: Normal rate, regular rhythm and intact distal pulses.  Exam reveals no gallop and no friction rub.   No murmur heard. Pulmonary/Chest: Effort normal and breath sounds normal. No respiratory distress. She has no wheezes. She has no rales.  Abdominal: Soft. Bowel sounds are normal. She exhibits no distension. There is no tenderness. There is no rebound and no guarding.  Musculoskeletal: She exhibits no edema or tenderness.  Left knee limited ROM   Lymphadenopathy:    She has no cervical adenopathy.  Neurological: She is oriented to person, place, and time.  Skin: Skin is warm and dry. No rash noted. No erythema. No pallor.  Left knee red and warm to touch  Psychiatric: She has a normal mood and affect.     Labs reviewed:  Recent Labs  08/03/15 0430 08/04/15 0400  08/15/15 09/01/15 1403 09/21/15 1059  NA 138 137  < > 138 141 139  K 5.2* 4.8  < > 4.6 5.1 5.1  CL 104 104  --   --   --  102  CO2 24 22  --   --  25 27  GLUCOSE 169* 133*  --   --  90 95  BUN 45* 38*  < > 22* 30.7* 35*  CREATININE 1.34* 1.37*  < > 1.2* 1.5* 1.43*  CALCIUM 8.8* 9.0  --   --  9.3 9.5  < > = values in this interval not displayed.  Recent Labs  04/26/15 0807 07/28/15 1125 08/09/15 09/01/15 1403  AST '29 31 20 23  '$ ALT '18 29 17 18  '$ ALKPHOS 79 71 64 104  BILITOT 0.52 0.6  --  0.30  PROT 7.7 8.2*  --  7.8  ALBUMIN 3.9 4.3  --  3.8    Recent Labs  04/26/15 0807  08/04/15 0400 08/09/15 09/01/15 1403 09/21/15 1059  WBC 6.8  < > 11.8* 6.9 8.1 7.5  NEUTROABS 4.4  --   --   --  5.2 4.8  HGB 12.3  < > 10.4* 10.2* 11.1* 12.3  HCT 38.2  < > 32.1* 33* 35.9 37.8  MCV 90.1  < > 90.9  --  93.5 88.3  PLT 156  < > 130* 184 209 221  < > = values in this interval not displayed. Lab Results  Component Value Date   TSH 4.160 05/31/2014   Lab Results  Component Value Date   HGBA1C 5.9* 05/31/2014   Lab Results  Component Value Date   CHOL 130 01/19/2013   HDL 33.60* 01/19/2013   LDLCALC 79 01/19/2013   TRIG 87.0 01/19/2013   CHOLHDL 4 01/19/2013    Assessment/Plan Left knee cellulitis  Afebrile.Left knee red and warm to touch. Start Doxyycline 100 mg Tablet twice daily X 7 days.    Family/ staff Communication: Reviewed plan of care with patient and facility Nurse supervisor.  Labs/tests ordered:  None

## 2015-12-19 ENCOUNTER — Ambulatory Visit (INDEPENDENT_AMBULATORY_CARE_PROVIDER_SITE_OTHER): Payer: Commercial Managed Care - HMO | Admitting: Cardiovascular Disease

## 2015-12-19 ENCOUNTER — Encounter: Payer: Self-pay | Admitting: Cardiovascular Disease

## 2015-12-19 VITALS — BP 149/93 | HR 84 | Ht 63.0 in | Wt 182.0 lb

## 2015-12-19 DIAGNOSIS — I2510 Atherosclerotic heart disease of native coronary artery without angina pectoris: Secondary | ICD-10-CM | POA: Diagnosis not present

## 2015-12-19 DIAGNOSIS — I10 Essential (primary) hypertension: Secondary | ICD-10-CM | POA: Diagnosis not present

## 2015-12-19 DIAGNOSIS — I481 Persistent atrial fibrillation: Secondary | ICD-10-CM | POA: Diagnosis not present

## 2015-12-19 DIAGNOSIS — I4819 Other persistent atrial fibrillation: Secondary | ICD-10-CM

## 2015-12-19 DIAGNOSIS — I5032 Chronic diastolic (congestive) heart failure: Secondary | ICD-10-CM

## 2015-12-19 NOTE — Patient Instructions (Signed)

## 2015-12-19 NOTE — Progress Notes (Signed)
Chief Complaint  Patient presents with  . Shortness of Breath    History of Present Illness: 82 yo female with history of CAD s/p BMS to RCA x 2 in 8921, diastolic CHF, HTN, HLD, COPD, pulmonary HTN, ? prior TIA, prior DVT, colon CA here today for cardiac follow up. Colon cancer diagnosed in 11/15. She underwent right hemicolectomy with diverting colostomy. She saw Dr. Domenic Polite in 5/16 and was cleared from a cardiac perspective for colostomy takedown and sigmoid colectomy for diverticulosis. She was seen by Lyda Jester, PA-C 12/16 for surgical clearance (for L TKR). ECG was noted to demonstrate new onset AFib with controlled VR. She underwent L femoral revision of TKR 08/02/15 with Dr. Wynelle Link.I saw her in FU 2/17. Eliquis started after knee replacement. She has been on metoprolol for rate control.   She is here today for follow up. She has chronic dyspnea on exertion. No changes. No chest pain.  She is not aware of her heart being out of rhythm. No palpitations. No LE edema.   Primary Care Physician: Nyoka Cowden, MD   Past Medical History  Diagnosis Date  . CAD (coronary artery disease)     a. LHC 4/09: pLAD 40, mDx 70-75, pLCx 30, mLCx 50, mRCA 90, EF 60% >> PCI: BMS x2 to RCA;  b. Myoview 8/12: normal  . Essential hypertension   . Chronic diastolic CHF (congestive heart failure) (HCC)     a. Echo 912 - Mild LVH, EF 55-60%, no RWMA, Gr 1 DD, mild MR, mild LAE, mild RAE, PASP 59 mmHg (mod to severe pulmo HTN)  . History of TIA (transient ischemic attack)     a. Head CT 6/15: small chronic lacunar infarct in thalamus  . Hyperlipidemia   . Depression   . COPD with chronic bronchitis ([place])   . GERD (gastroesophageal reflux disease)   . History of hiatal hernia   . History of DVT of lower extremity   . Sigmoid diverticulosis     s/p sigmoid colectomy  . Colovaginal fistula     s/p colostomy >> colostomy takedown 5/16  . History of adenomatous polyp of colon   .  Renal insufficiency   . OA (osteoarthritis)   . Wears glasses   . Wears dentures   . Colostomy in place [facility])     s/p colostomy takedown 5/16  . Peripheral neuropathy ([place])   . Chronic anemia   . History of blood transfusion   . Allergy   . Anxiety   . Cataract     bil cateracts removed  . Pulmonary HTN ([place])     a. PASP on Echo in 9/12:  59 mmHg  . Dysrhythmia     A fib  . Stroke ([place])     TIAs  . Pneumonia   . Adenocarcinoma, colon [facility]) Oncologist-- Dr Truitt Merle    Multifocal (2) Colon cancer @ ileocecal valve and ascending ( [id number]), Stage IIIB, grade I, MMR normal , 2 of 16 +lymph nodes, negative surgical margins---  06-02-2014  Right hemicolectomy w/ colostomy    Past Surgical History  Procedure Laterality Date  . Appendectomy    . Cholecystectomy    . Abdominal hysterectomy    . Lumbar laminectomy  10-29-2002,  1969    Left  L3 -- 4  decompression  . Total knee arthroplasty Bilateral left 1994/  right 2000  . Rotator cuff repair Bilateral   . Cataract extraction w/ intraocular lens  implant, bilateral Bilateral   .  Tonsillectomy    . Dilation and curettage of uterus    . Patellectomy Right 09/14/2013    Procedure: PATELLECTOMY;  Surgeon: Meredith Pel, MD;  Location: [place];  Service: Orthopedics;  Laterality: Right;  . Partial colectomy N/A 06/02/2014    Procedure: RIGHT PARTIAL COLECTOMY ;  Surgeon: Donnie Mesa, MD;  Location: [place];  Service: General;  Laterality: N/A;  . Colostomy N/A 06/02/2014    Procedure: DIVERTING DESCENDING END COLOSTOMY;  Surgeon: Donnie Mesa, MD;  Location: [place];  Service: General;  Laterality: N/A;  . Revision total knee arthroplasty Bilateral right  04-02-2011/  left 1996 & 10-05-1999  . Anterior cervical decomp/discectomy fusion  01-31-2009    C5 -- 7  . Cardiovascular stress test  02-26-2011    Normal lexiscan no exercise study/  no ischemia/  normal LV function and wall motion, ef 67%  . Transthoracic echocardiogram   12-01-2009    Grade I diastolic dysfunction/  ef 60%/  moderate MR/  mild TR  . Evaluation under anesthesia with anal fistulectomy N/A 10/13/2014    Procedure: ANAL EXAM UNDER ANESTHESIA ;  Surgeon: [name], MD;  Location: [facility];  Service: General;  Laterality: N/A;  . Proctoscopy N/A 10/13/2014    Procedure: RIDGE PROCTOSCOPY;  Surgeon: [name], MD;  Location: [facility];  Service: General;  Laterality: N/A;  . Coronary angioplasty with stent placement  11-04-2007  dr [email or web address]    BMS x2 to RCA/  mild Non-obstructive disease LAD/  normal LVF  . Bone spurs Bilateral     Feet  . Hand surgery      Tendon repair  . Carpal tunnel release Right   . Laparoscopic sigmoid colectomy N/A 11/24/2014    Procedure: SIGMOID COLECTOMY AND COLSOTOMY CLOSURE;  Surgeon: Donnie Mesa, MD;  Location: [place];  Service: General;  Laterality: N/A;  . Total knee revision Left 08/02/2015    Procedure: LEFT FEMORAL REVISION;  Surgeon: Gaynelle Arabian, MD;  Location: [facility];  Service: Orthopedics;  Laterality: Left;    Current Outpatient Prescriptions  Medication Sig Dispense Refill  . acetaminophen (TYLENOL) 325 MG tablet Take 650 mg by mouth every 6 (six) hours as needed for moderate pain.     Marland Kitchen apixaban (ELIQUIS) 5 MG TABS tablet Take 1 tablet (5 mg total) by mouth 2 (two) times daily. 180 tablet 3  . benazepril (LOTENSIN) 40 MG tablet TAKE 1 TABLET EVERY DAY 90 tablet 1  . esomeprazole (NEXIUM) 20 MG capsule Take 20 mg by mouth daily at 12 noon.    . ferrous sulfate 325 (65 FE) MG tablet Take 1 tablet (325 mg total) by mouth 2 (two) times daily with a meal.  3  . furosemide (LASIX) 20 MG tablet Take 20 mg daily today and tomorrow then decrease to 20 mg daily on Mondays, Wednesdays and Fridays 20 tablet 3  . gabapentin (NEURONTIN) 600 MG tablet Take 1 tablet by mouth 3 (three) times daily.    Marland Kitchen HYDROcodone-acetaminophen (NORCO/VICODIN) 5-325 MG tablet Take 1 tablet by mouth every 6 (six) hours as needed for  moderate pain. 60 tablet 0  . LORazepam (ATIVAN) 0.5 MG tablet TAKE ONE TABLET BY MOUTH TWICE DAILY AS NEEDED FOR ANXIETY OR SLEEP 60 tablet 2  . methocarbamol (ROBAXIN) 500 MG tablet Take 1 tablet (500 mg total) by mouth every 6 (six) hours as needed for muscle spasms. 80 tablet 0  . metoprolol (LOPRESSOR) 100 MG tablet Take 1 tablet (100 mg total)  by mouth 2 (two) times daily. 180 tablet 3  . nitroGLYCERIN (NITROSTAT) 0.4 MG SL tablet Place 0.4 mg under the tongue every 5 (five) minutes as needed for chest pain (x 3 pills). Reported on 09/01/2015    . Polyethyl Glycol-Propyl Glycol (SYSTANE OP) Place 1 drop into both eyes 2 (two) times daily as needed (for dry eyes).    . promethazine (PHENERGAN) 12.5 MG tablet TAKE 1 TABLET (12.5 MG TOTAL) BY MOUTH EVERY 8 HOURS AS NEEDED FOR NAUSEA OR VOMITING. 20 tablet 1  . sertraline (ZOLOFT) 50 MG tablet TAKE 1 TABLET EVERY DAY 90 tablet 1  . traMADol (ULTRAM) 50 MG tablet Take 1 tablet (50 mg total) by mouth daily as needed. 60 tablet 2  . traZODone (DESYREL) 100 MG tablet TAKE 1/2 TABLET AT BEDTIME 45 tablet 1  . triamcinolone cream (KENALOG) 0.1 % Apply 1 application topically 2 (two) times daily as needed (Eczema on legs). Reported on 07/25/2015     No current facility-administered medications for this visit.    No Known Allergies  Social History   Social History  . Marital Status: Married    Spouse Name: N/A  . Number of Children: N/A  . Years of Education: N/A   Occupational History  . Not on file.   Social History Main Topics  . Smoking status: Former Smoker -- 0.50 packs/day for 15 years    Types: Cigarettes    Quit date: 07/16/1967  . Smokeless tobacco: Never Used  . Alcohol Use: No  . Drug Use: No  . Sexual Activity: Not on file   Other Topics Concern  . Not on file   Social History Narrative   Married, 2 children.  As of November 2015 her husband has been living in a nursing home for tendon after years as he suffers from  multiple medical problems.   Patient does not drink alcohol nor does she smoke or chew tobacco products   Right handed   8th grade   1 cup daily    Family History  Problem Relation Age of Onset  . Osteoarthritis Mother   . Heart failure Mother   . Cancer Sister 77    ? colon cancer   . Kidney cancer Brother 37  . Colon cancer Maternal Uncle 23  . Colon cancer Maternal Uncle   . Esophageal cancer Neg Hx   . Rectal cancer Neg Hx   . Stomach cancer Neg Hx   . Heart Problems Father     Review of Systems:  As stated in the HPI and otherwise negative.   BP 149/93 mmHg  Pulse 84  Ht '5\' 3"'$  (1.6 m)  Wt 182 lb (82.555 kg)  BMI 32.25 kg/m2  Physical Examination: General: Well developed, well nourished, NAD HEENT: OP clear, mucus membranes moist SKIN: warm, dry. No rashes. Neuro: No focal deficits Musculoskeletal: Muscle strength 5/5 all ext Psychiatric: Mood and affect normal Neck: No JVD, no carotid bruits, no thyromegaly, no lymphadenopathy. Lungs:Clear bilaterally, no wheezes, rhonci, crackles Cardiovascular: Regular rate and rhythm. No murmurs, gallops or rubs. Abdomen:Soft. Bowel sounds present. Non-tender.  Extremities: No lower extremity edema. Pulses are 2 + in the bilateral DP/PT.  Echo January 2017: Left ventricle: The cavity size was normal. Wall thickness was  increased in a pattern of mild LVH. Systolic function was normal.  The estimated ejection fraction was in the range of 50% to 55%.  Wall motion was normal; there were no regional wall motion  abnormalities. -  Mitral valve: There was moderate regurgitation. - Left atrium: The atrium was severely dilated. - Right atrium: The atrium was mildly dilated. - Pulmonary arteries: Systolic pressure was mildly increased. PA  peak pressure: 44 mm Hg (S).  Impressions:  - Low normal LV systolic function; severe LAE; mild RAE; moderate  MR; mild TR; mildly elevated pulmonary pressure.  EKG:  EKG is not  ordered today. The ekg ordered today demonstrates   Recent Labs: 07/25/2015: Brain Natriuretic Peptide 443.2* 09/01/2015: ALT 18 09/21/2015: BUN 35*; Creat 1.43*; Hemoglobin 12.3; Platelets 221; Potassium 5.1; Sodium 139   Lipid Panel    Component Value Date/Time   CHOL 130 01/19/2013 1014   TRIG 87.0 01/19/2013 1014   HDL 33.60* 01/19/2013 1014   CHOLHDL 4 01/19/2013 1014   VLDL 17.4 01/19/2013 1014   LDLCALC 79 01/19/2013 1014     Wt Readings from Last 3 Encounters:  12/19/15 182 lb (82.555 kg)  09/21/15 176 lb 12.8 oz (80.196 kg)  09/05/15 179 lb (81.194 kg)     Other studies Reviewed: Additional studies/ records that were reviewed today include: . Review of the above records demonstrates:    Assessment and Plan:   1. Persistent Atrial Fib:  She remains in AFib. CHADS2-VASc=7. She needs to remain on long term anticoagulation. Continue Eliquis 5 mg bid, Metoprolol Tartrate to 100 mg bid. Unlikely that she would maintain NSR after DCCV. Will continue current plan of rate control and anti-coagulation.   2. CAD:  No recent chest pain suggestive of angina. Echo January 2017 with normal LVEF and normal wall motion. Continue beta blocker. No ASA as she is on Eliquis.    3. Chronic Diastolic CHF:  Volume stable. Continue current Rx.   4. HTN: BP has been controlled at home. No changes today.   Current medicines are reviewed at length with the patient today.  The patient does not have concerns regarding medicines.  The following changes have been made:  no change  Labs/ tests ordered today include:  No orders of the defined types were placed in this encounter.    Disposition:   FU with me in 6 months  Signed, Lauree Chandler, MD 12/19/2015 11:49 AM    [facility] [place], [place], [place]  [address] Phone: [phone]; Fax: [phone]

## 2016-01-09 ENCOUNTER — Telehealth: Payer: Self-pay | Admitting: Hematology

## 2016-01-09 ENCOUNTER — Encounter: Payer: Self-pay | Admitting: Hematology

## 2016-01-09 ENCOUNTER — Ambulatory Visit (HOSPITAL_BASED_OUTPATIENT_CLINIC_OR_DEPARTMENT_OTHER): Payer: Commercial Managed Care - HMO | Admitting: Hematology

## 2016-01-09 ENCOUNTER — Other Ambulatory Visit (HOSPITAL_BASED_OUTPATIENT_CLINIC_OR_DEPARTMENT_OTHER): Payer: Commercial Managed Care - HMO

## 2016-01-09 VITALS — BP 123/78 | HR 75 | Temp 98.3°F | Resp 18 | Ht 63.0 in | Wt 184.5 lb

## 2016-01-09 DIAGNOSIS — I2510 Atherosclerotic heart disease of native coronary artery without angina pectoris: Secondary | ICD-10-CM | POA: Diagnosis not present

## 2016-01-09 DIAGNOSIS — C182 Malignant neoplasm of ascending colon: Secondary | ICD-10-CM

## 2016-01-09 DIAGNOSIS — D649 Anemia, unspecified: Secondary | ICD-10-CM | POA: Diagnosis not present

## 2016-01-09 LAB — COMPREHENSIVE METABOLIC PANEL
ALT: 19 U/L (ref 0–55)
AST: 24 U/L (ref 5–34)
Albumin: 3.7 g/dL (ref 3.5–5.0)
Alkaline Phosphatase: 75 U/L (ref 40–150)
Anion Gap: 9 mEq/L (ref 3–11)
BUN: 26.6 mg/dL — ABNORMAL HIGH (ref 7.0–26.0)
CO2: 27 mEq/L (ref 22–29)
Calcium: 9.1 mg/dL (ref 8.4–10.4)
Chloride: 105 mEq/L (ref 98–109)
Creatinine: 1.4 mg/dL — ABNORMAL HIGH (ref 0.6–1.1)
EGFR: 34 mL/min/{1.73_m2} — ABNORMAL LOW (ref >90)
Glucose: 95 mg/dl (ref 70–140)
Potassium: 4.9 mEq/L (ref 3.5–5.1)
Sodium: 140 mEq/L (ref 136–145)
Total Bilirubin: 0.35 mg/dL (ref 0.20–1.20)
Total Protein: 7.3 g/dL (ref 6.4–8.3)

## 2016-01-09 LAB — CBC WITH DIFFERENTIAL/PLATELET
BASO%: 0.2 % (ref 0.0–2.0)
Basophils Absolute: 0 10*3/uL (ref 0.0–0.1)
EOS%: 1.5 % (ref 0.0–7.0)
Eosinophils Absolute: 0.1 10*3/uL (ref 0.0–0.5)
HCT: 39.5 % (ref 34.8–46.6)
HGB: 12.6 g/dL (ref 11.6–15.9)
LYMPH%: 21.7 % (ref 14.0–49.7)
MCH: 28.1 pg (ref 25.1–34.0)
MCHC: 31.9 g/dL (ref 31.5–36.0)
MCV: 88.1 fL (ref 79.5–101.0)
MONO#: 0.6 10*3/uL (ref 0.1–0.9)
MONO%: 9.3 % (ref 0.0–14.0)
NEUT#: 4.1 10*3/uL (ref 1.5–6.5)
NEUT%: 67.3 % (ref 38.4–76.8)
Platelets: 134 10*3/uL — ABNORMAL LOW (ref 145–400)
RBC: 4.48 10*6/uL (ref 3.70–5.45)
RDW: 14.8 % — ABNORMAL HIGH (ref 11.2–14.5)
WBC: 6.1 10*3/uL (ref 3.9–10.3)
lymph#: 1.3 10*3/uL (ref 0.9–3.3)

## 2016-01-09 NOTE — Progress Notes (Signed)
[facility]  [phone] [phone]  Clinic Follow Up Note   Patient Care Team: Whitney Savers, MD as PCP - General (Internal Medicine) 01/09/2016  CHIEF COMPLAINTS:  Follow up multifocal colon cancer    Cancer of ascending colon (HCC)   06/02/2014 Initial Diagnosis Adenocarcinoma, colon. she presented with anemia, nauseam anorexia and abdominal pain and was [facility]2014.   06/02/2014 Surgery exploratory laparotomy, right hemicolectomy, and descending colostomy by Dr. Corliss Skains   06/02/2014 Pathologic Stage multifocal (2) colon cancer at  ileocecal valve and ascending colon.,  mT4N1Mx, at least stage IIIB, grade 1, MMR normal, surgical margins negative.    06/02/2014 Tumor Marker CEA 1.7   11/24/2014 Surgery SIGMOID COLECTOMY AND COLSOTOMY CLOSURE with Manus Rudd     HISTORY OF PRESENTING ILLNESS:  [name] 82 y.o. female is here because of recently diagnosed and resected multifocal colon cancer. I saw him when she was admitted to the hospital, and she is here with her family members for follow-up.  She has a history of chronic anemia and colovaginal fistula as well as diverticulosis with colonic polyps since 2008, was admitted to [facility] on 05/31/2014 With progressive abdominal pain, worse over the last couple of weeks, accompanied by nausea and vomiting, without weight loss. Workup included a CT of the abdomen and pelvis with contrast, which revealed acute small bowel obstruction due to what appears to be a soft tissue mass (apple core lesion) at the ileocecal valve suspicious for intestinal adenocarcinoma.This mass measures 6 x 4 cm. Up to 8 mm stable pericecal lymph nodes were visualized. No liver or distant metastasis identified. Small volume free fluid in the abdomen and pelvis, Likely reactive, was seen. No free air.  She underwent exploratory laparotomy, right hemicolectomy, and descending colostomy by Dr.  Corliss Skains on 06/02/2014. Grossly, there are two tumors identified. The larger tumor is located at the ileocecal valve and consists of an 8.5 cm span of moderately differentiated adenocarcinoma that involves the serosa. This tumor has associated perineural and lymphovascular invasion. The second discontiguous tumor is present in the proximal right colon and spans 4.0 cm and consists of well differentiated adenocarcinoma that is morphologically dissimilar from the larger tumor. This tumor extends into the muscularis propria.Her CEA is 1.7. It is stage mT4, N1b. immunohistochemical stains are normal, with very low probability of microsatellite instability (See details below ).  She was discharged to rehabilitation after her surgery. She has been doing well at to rehabilitation. She had a surgical wound infection a few weeks after surgery, which was treated with oral antibiotics and wound care. She was not very physically active before the surgery due to her multiple arthritis, she is recovering well at the nursing home and is likely going home in a few days. She is able to move around with a walker, has good appetite, surgical site pain is controlled, no nausea or other complaints. She did developed some UTI symptoms, and started Cipro and probiotics yesterday at the nursing home.  CURRENT THERAPY: Surveillance  INTERIM HISTORY She returns for follow up, she is accompanied by her daughter to the clinic today. She complains of right rib cage pain and bilateral low pelvic pain, persistent, moderate, she takes tylenol and hydrocordone as needed for that, it has been going on for a few months now, overall stable. She is not very active due to the knee pain from multiple knee surgeries, no other new complaints. She denies significant epigastric  discomfort, nausea, or bloating. Her bowel movements normal, no hematochezia or melena.   MEDICAL HISTORY:  Past Medical History  Diagnosis Date  . CAD (coronary artery  disease)     a. LHC 4/09: pLAD 40, mDx 70-75, pLCx 30, mLCx 50, mRCA 90, EF 60% >> PCI: BMS x2 to RCA;  b. Myoview 8/12: normal  . Essential hypertension   . Chronic diastolic CHF (congestive heart failure) (HCC)     a. Echo 912 - Mild LVH, EF 55-60%, no RWMA, Gr 1 DD, mild MR, mild LAE, mild RAE, PASP 59 mmHg (mod to severe pulmo HTN)  . History of TIA (transient ischemic attack)     a. Head CT 6/15: small chronic lacunar infarct in thalamus  . Hyperlipidemia   . Depression   . COPD with chronic bronchitis ([place])   . GERD (gastroesophageal reflux disease)   . History of hiatal hernia   . History of DVT of lower extremity   . Sigmoid diverticulosis     s/p sigmoid colectomy  . Colovaginal fistula     s/p colostomy >> colostomy takedown 5/16  . History of adenomatous polyp of colon   . Renal insufficiency   . OA (osteoarthritis)   . Wears glasses   . Wears dentures   . Colostomy in place [facility])     s/p colostomy takedown 5/16  . Peripheral neuropathy ([place])   . Chronic anemia   . History of blood transfusion   . Allergy   . Anxiety   . Cataract     bil cateracts removed  . Pulmonary HTN ([place])     a. PASP on Echo in 9/12:  59 mmHg  . Dysrhythmia     A fib  . Stroke ([place])     TIAs  . Pneumonia   . Adenocarcinoma, colon [facility]) Oncologist-- Dr [name]    Multifocal (2) Colon cancer @ ileocecal valve and ascending ( mT4N1Mx), Stage IIIB, grade I, MMR normal , 2 of 16 +lymph nodes, negative surgical margins---  06-02-2014  Right hemicolectomy w/ colostomy    SURGICAL HISTORY: Past Surgical History  Procedure Laterality Date  . Appendectomy    . Cholecystectomy    . Abdominal hysterectomy    . Lumbar laminectomy  10-29-2002,  1969    Left  L3 -- 4  decompression  . Total knee arthroplasty Bilateral left 1994/  right 2000  . Rotator cuff repair Bilateral   . Cataract extraction w/ intraocular lens  implant, bilateral Bilateral   . Tonsillectomy    . Dilation and curettage of  uterus    . Patellectomy Right 09/14/2013    Procedure: PATELLECTOMY;  Surgeon: Meredith Pel, MD;  Location: [facility];  Service: Orthopedics;  Laterality: Right;  . Partial colectomy N/A 06/02/2014    Procedure: RIGHT PARTIAL COLECTOMY ;  Surgeon: Donnie Mesa, MD;  Location: [facility];  Service: General;  Laterality: N/A;  . Colostomy N/A 06/02/2014    Procedure: DIVERTING DESCENDING END COLOSTOMY;  Surgeon: Donnie Mesa, MD;  Location: [facility];  Service: General;  Laterality: N/A;  . Revision total knee arthroplasty Bilateral right  04-02-2011/  left 1996 & 10-05-1999  . Anterior cervical decomp/discectomy fusion  01-31-2009    C5 -- 7  . Cardiovascular stress test  02-26-2011    Normal lexiscan no exercise study/  no ischemia/  normal LV function and wall motion, ef 67%  . Transthoracic echocardiogram  12-01-2009    Grade I diastolic dysfunction/  ef  60%/  moderate MR/  mild TR  . Evaluation under anesthesia with anal fistulectomy N/A 10/13/2014    Procedure: ANAL EXAM UNDER ANESTHESIA ;  Surgeon: [name], MD;  Location: [facility];  Service: General;  Laterality: N/A;  . Proctoscopy N/A 10/13/2014    Procedure: RIDGE PROCTOSCOPY;  Surgeon: [name], MD;  Location: [facility];  Service: General;  Laterality: N/A;  . Coronary angioplasty with stent placement  11-04-2007  dr [email or web address]    BMS x2 to RCA/  mild Non-obstructive disease LAD/  normal LVF  . Bone spurs Bilateral     Feet  . Hand surgery      Tendon repair  . Carpal tunnel release Right   . Laparoscopic sigmoid colectomy N/A 11/24/2014    Procedure: SIGMOID COLECTOMY AND COLSOTOMY CLOSURE;  Surgeon: Donnie Mesa, MD;  Location: [facility];  Service: General;  Laterality: N/A;  . Total knee revision Left 08/02/2015    Procedure: LEFT FEMORAL REVISION;  Surgeon: Gaynelle Arabian, MD;  Location: [facility];  Service: Orthopedics;  Laterality: Left;    SOCIAL HISTORY: Social History   Social History  . Marital Status: Married    Spouse  Name: N/A  . Number of Children: N/A  . Years of Education: N/A   Occupational History  . Not on file.   Social History Main Topics  . Smoking status: Former Smoker -- 0.50 packs/day for 15 years    Types: Cigarettes    Quit date: 07/16/1967  . Smokeless tobacco: Never Used  . Alcohol Use: No  . Drug Use: No  . Sexual Activity: Not on file   Other Topics Concern  . Not on file   Social History Narrative   Married, 2 children.  As of November 2015 her husband has been living in a nursing home for tendon after years as he suffers from multiple medical problems.   Patient does not drink alcohol nor does she smoke or chew tobacco products   Right handed   8th grade   1 cup daily   She worked for [employer] for 40 years.    FAMILY HISTORY: Family History  Problem Relation Age of Onset  . Osteoarthritis Mother   . Heart failure Mother   . Cancer Sister 27    ? colon cancer   . Kidney cancer Brother 73  . Colon cancer Maternal Uncle 61  . Colon cancer Maternal Uncle   . Esophageal cancer Neg Hx   . Rectal cancer Neg Hx   . Stomach cancer Neg Hx   . Heart Problems Father     ALLERGIES:  has No Known Allergies.  MEDICATIONS:  Current Outpatient Prescriptions  Medication Sig Dispense Refill  . acetaminophen (TYLENOL) 325 MG tablet Take 650 mg by mouth every 6 (six) hours as needed for moderate pain.     Marland Kitchen apixaban (ELIQUIS) 5 MG TABS tablet Take 1 tablet (5 mg total) by mouth 2 (two) times daily. 180 tablet 3  . benazepril (LOTENSIN) 40 MG tablet TAKE 1 TABLET EVERY DAY 90 tablet 1  . esomeprazole (NEXIUM) 20 MG capsule Take 20 mg by mouth daily at 12 noon.    . ferrous sulfate 325 (65 FE) MG tablet Take 1 tablet (325 mg total) by mouth 2 (two) times daily with a meal.  3  . furosemide (LASIX) 20 MG tablet Take 20 mg daily today and tomorrow then decrease to 20 mg daily on Mondays, Wednesdays and Fridays 20 tablet 3  .  gabapentin (NEURONTIN) 600 MG tablet Take 1 tablet  by mouth 3 (three) times daily.    Marland Kitchen HYDROcodone-acetaminophen (NORCO/VICODIN) 5-325 MG tablet Take 1 tablet by mouth every 6 (six) hours as needed for moderate pain. 60 tablet 0  . LORazepam (ATIVAN) 0.5 MG tablet TAKE ONE TABLET BY MOUTH TWICE DAILY AS NEEDED FOR ANXIETY OR SLEEP 60 tablet 2  . methocarbamol (ROBAXIN) 500 MG tablet Take 1 tablet (500 mg total) by mouth every 6 (six) hours as needed for muscle spasms. 80 tablet 0  . metoprolol (LOPRESSOR) 100 MG tablet Take 1 tablet (100 mg total) by mouth 2 (two) times daily. 180 tablet 3  . nitroGLYCERIN (NITROSTAT) 0.4 MG SL tablet Place 0.4 mg under the tongue every 5 (five) minutes as needed for chest pain (x 3 pills). Reported on 09/01/2015    . Polyethyl Glycol-Propyl Glycol (SYSTANE OP) Place 1 drop into both eyes 2 (two) times daily as needed (for dry eyes).    . promethazine (PHENERGAN) 12.5 MG tablet TAKE 1 TABLET (12.5 MG TOTAL) BY MOUTH EVERY 8 HOURS AS NEEDED FOR NAUSEA OR VOMITING. 20 tablet 1  . sertraline (ZOLOFT) 50 MG tablet TAKE 1 TABLET EVERY DAY 90 tablet 1  . traMADol (ULTRAM) 50 MG tablet Take 1 tablet (50 mg total) by mouth daily as needed. 60 tablet 2  . traZODone (DESYREL) 100 MG tablet TAKE 1/2 TABLET AT BEDTIME 45 tablet 1  . triamcinolone cream (KENALOG) 0.1 % Apply 1 application topically 2 (two) times daily as needed (Eczema on legs). Reported on 07/25/2015     No current facility-administered medications for this visit.    REVIEW OF SYSTEMS:   Constitutional: Denies fevers, chills or abnormal night sweats Eyes: Denies blurriness of vision, double vision or watery eyes Ears, nose, mouth, throat, and face: Denies mucositis or sore throat Respiratory: Denies cough, dyspnea or wheezes Cardiovascular: Denies palpitation, chest discomfort or lower extremity swelling Gastrointestinal:  Denies nausea, heartburn or change in bowel habits Skin: Denies abnormal skin rashes Lymphatics: Denies new lymphadenopathy or easy  bruising Neurological:Denies numbness, tingling or new weaknesses Behavioral/Psych: Mood is stable, no new changes  All other systems were reviewed with the patient and are negative.  PHYSICAL EXAMINATION: ECOG PERFORMANCE STATUS: 3  Filed Vitals:   01/09/16 0942  BP: 123/78  Pulse: 75  Temp: 98.3 F (36.8 C)  Resp: 18   Filed Weights   01/09/16 0942  Weight: 184 lb 8 oz (83.689 kg)    GENERAL:alert, no distress and comfortable SKIN: skin color, texture, turgor are normal, no rashes or significant lesions EYES: normal, conjunctiva are pink and non-injected, sclera clear OROPHARYNX:no exudate, no erythema and lips, buccal mucosa, and tongue normal  NECK: supple, thyroid normal size, non-tender, without nodularity LYMPH:  no palpable lymphadenopathy in the cervical, axillary or inguinal LUNGS: clear to auscultation and percussion with normal breathing effort HEART: regular rate & rhythm and no murmurs and no lower extremity edema ABDOMEN:abdomen soft, the midline surgical wound is well healed, appears clean. Mild tenderness at RLQ  and normal bowel sounds Musculoskeletal:no cyanosis of digits and no clubbing  PSYCH: alert & oriented x 3 with fluent speech NEURO: no focal motor/sensory deficits.   LABORATORY DATA:  I have reviewed the data as listed CBC Latest Ref Rng 01/09/2016 09/21/2015 09/01/2015  WBC 3.9 - 10.3 10e3/uL 6.1 7.5 8.1  Hemoglobin 11.6 - 15.9 g/dL 12.6 12.3 11.1(L)  Hematocrit 34.8 - 46.6 % 39.5 37.8 35.9  Platelets 145 -  400 10e3/uL 134(L) 221 209    CMP Latest Ref Rng 01/09/2016 09/21/2015 09/01/2015  Glucose 70 - 140 mg/dl 95 95 90  BUN 7.0 - 26.0 mg/dL 26.6(H) 35(H) 30.7(H)  Creatinine 0.6 - 1.1 mg/dL 1.4(H) 1.43(H) 1.5(H)  Sodium 136 - 145 mEq/L 140 139 141  Potassium 3.5 - 5.1 mEq/L 4.9 5.1 5.1  Chloride 98 - 110 mmol/L - 102 -  CO2 22 - 29 mEq/L '27 27 25  '$ Calcium 8.4 - 10.4 mg/dL 9.1 9.5 9.3  Total Protein 6.4 - 8.3 g/dL 7.3 - 7.8  Total Bilirubin  0.20 - 1.20 mg/dL 0.35 - 0.30  Alkaline Phos 40 - 150 U/L 75 - 104  AST 5 - 34 U/L 24 - 23  ALT 0 - 55 U/L 19 - 18   CEA  Status: Finalresult Visible to patient:  Not Released Nextappt: 01/17/2016 at 10:00 AM in [place] Nyoka Cowden, MD) Dx:  Cancer of ascending colon ([place])           Ref Range 5d ago  29moago     CEA 0.0 - 4.7 ng/mL 2.5 2.6CM          Pathology report: 06/02/2014 Colon, segmental resection for tumor, right - MULTIFOCAL INVASIVE ADENOCARCINOMA, SPANNING 8.5 CM AND 4.0 CM. - ADENOCARCINOMA INVOLVES THE SEROSA. - LYMPHOVASCULAR INVASION IS IDENTIFIED. 1 of 4 FINAL for [name], Whitney C ([id number]) Diagnosis(continued) - PERINEURAL INVASION IS IDENTIFIED. - METASTATIC ADENOCARCINOMA IN 2 OF 16 LYMPH NODES (2/16) WITH EXTRACAPSULAR EXTENSION. - THE SURGICAL RESECTION MARGINS ARE NEGATIVE FOR ADENOCARCINOMA. - SEE ONCOLOGY TABLE BELOW. Microscopic Comment COLON AND RECTUM (INCLUDING TRANS-ANAL RESECTION): Specimen: Terminal ileum and right colon Procedure: Resection Tumor site: Ileocecal valve (tumor #1) and proximal mid ascending colon (tumor #2) Specimen integrity: Intact Macroscopic intactness of mesorectum: N/A Macroscopic tumor perforation: Not identified Invasive tumor: Tumor #1 (ileocecal valve) Maximum size: 8.5 cm (gross measurement) Histologic type(s): Adenocarcinoma Histologic grade and differentiation: G2: moderately differentiated/low grade Type of polyp in which invasive carcinoma arose: Likely tubular adenoma Microscopic extension of invasive tumor: Adenocarcinoma involves the serosa Invasive tumor: Tumor #2 (proximal mid ascending colon) Maximum size: 4.0 cm (gross measurement) Histologic type(s): Adenocarcinoma Histologic grade and differentiation: G1: well differentiated/low grade Type of polyp in which invasive carcinoma arose: Tubulovillous adenoma Microscopic extension of invasive tumor:  Adenocarcinoma extends into the muscularis propria Lymph-Vascular invasion: Present Peri-neural invasion: Present Tumor deposit(s) (discontinuous extramural extension): Not identified Resection margins: Proximal margin: 32.0 cm Distal margin: 7.5 cm Circumferential (radial) (posterior ascending, posterior descending; lateral and posterior mid-rectum; and entire lower 1/3 rectum): 3.0 cm Treatment effect (neo-adjuvant therapy): N/A Additional polyp(s): Not identified Non-neoplastic findings: No significant findings Lymph nodes: number examined 16; number positive: 2 Pathologic Staging: mT4, N1b Ancillary studies: MSI testing will be performed on the larger higher grade tumor and the results reported separately. Additional studies can be performed upon clinician request. Comments: Grossly, there are two tumors identified. The larger tumor is located at the ileocecal valve and consists of an 8.5 cm span of moderately differentiated adenocarcinoma that involves the serosa. This tumor has associated perineural and lymphovascular invasion. The second discontiguous tumor is present in the proximal right colon and spans 4.0 cm and consists of well differentiated adenocarcinoma that is morphologically dissimilar from the larger tumor. This tumor extends into the muscularis propria. Whitney CutterMD Pathologist, Electronic Signature (Case signed 06/06/2014) Specimen Gross and Clinical Information   RADIOGRAPHIC STUDIES: I have personally reviewed the radiological images as listed  and agreed with the findings in the report.  Ct abdomen and pelvis with contrast 04/26/2015 IMPRESSION: Postsurgical changes related to partial right hemicolectomy and partial left hemicolectomy. No evidence of bowel obstruction.  No findings to suggest recurrent or metastatic disease.  Prior rectovaginal fistulas is not apparent on the current study.  Additional ancillary findings as above.  ASSESSMENT &  PLAN:  82 year old Caucasian female, with multiple medical comorbidities, presented with anemia, abdominal pain and nausea. She underwent exploratory laparotomy, right hemicolectomy, and descending colostomy by Dr. Georgette Dover on 06/02/2014. Surgical path reviewed multifocal (2) colon cancer at  ileocecal valve and ascending colon.  1. Multifocal invasive colon adenocarcinoma, mT4N1Mx, at least stage IIIB, grade 1, MMR normal. -I reviewed her surgical findings and her CT of abdomen and pelvis with patient and her extended family members in great detail today. Both of her colon cancer were completely surgically resected, however she does have high risk of cancer recurrence in the future due to locally advanced stage.  -Her surveillance CT scan from 04/26/2015 showed no evidence of recurrence -I reviewed her lab today. her anemia has resolved, currently 1.4, overall stable. Her CEA was normal. -Her clinical exam was unremarkable, no evidence of recurrence. -We'll continue surveillance. She has multiple complaints, especially bilateral low pelvic pain,  I'll see her back in 2 months with lab and surveillance CT scan before next visit  -She attempted colonoscopy, but procedure was aborted due to her high blood pressure and complicated cardiac history.   2. Genetics -Due to her family history of colon cancer, and her daughters multifocal polyps, I recommend her to have a genetic counseling. Her daughter met our genetic counselor before and genetic testing was negative. -She declined genetic testing at this point.  3. Anemia -resolved now  -serum iron and ferritin were normal  -Continue oral iron supplement.   4. CAD, CHF -She will continue follow-up with her cardiologist Dr. Kathlen Mody  Follow-up with her primary care physician for other medical issues.  Plan return to clinic in 2 month with labs and CT chest, abdomen and pelvis with oral contrast only ( no IV contrast due to her renal  insufficiency)   All questions were answered. The patient knows to call the clinic with any problems, questions or concerns.  I spent 20 minutes counseling the patient face to face. The total time spent in the appointment was 25 minutes and more than 50% was on counseling.     Whitney Merle, MD 01/09/2016 10:22 AM

## 2016-01-09 NOTE — Telephone Encounter (Signed)
per pof to sch pt appt-gave avs and contrast-adv central sch will call to sch scan

## 2016-01-10 LAB — CEA (PARALLEL TESTING): CEA: 1.5 ng/mL

## 2016-01-10 LAB — CEA: CEA: 2.5 ng/mL (ref 0.0–4.7)

## 2016-01-17 ENCOUNTER — Encounter: Payer: Self-pay | Admitting: Internal Medicine

## 2016-01-17 ENCOUNTER — Ambulatory Visit (INDEPENDENT_AMBULATORY_CARE_PROVIDER_SITE_OTHER): Payer: Commercial Managed Care - HMO | Admitting: Internal Medicine

## 2016-01-17 VITALS — BP 140/90 | HR 84 | Temp 97.9°F | Resp 20 | Ht 63.0 in | Wt 184.0 lb

## 2016-01-17 DIAGNOSIS — I481 Persistent atrial fibrillation: Secondary | ICD-10-CM

## 2016-01-17 DIAGNOSIS — C182 Malignant neoplasm of ascending colon: Secondary | ICD-10-CM

## 2016-01-17 DIAGNOSIS — I10 Essential (primary) hypertension: Secondary | ICD-10-CM

## 2016-01-17 DIAGNOSIS — I4819 Other persistent atrial fibrillation: Secondary | ICD-10-CM

## 2016-01-17 DIAGNOSIS — I2510 Atherosclerotic heart disease of native coronary artery without angina pectoris: Secondary | ICD-10-CM | POA: Diagnosis not present

## 2016-01-17 MED ORDER — LORAZEPAM 0.5 MG PO TABS: ORAL_TABLET | ORAL | Status: DC

## 2016-01-17 MED ORDER — HYDROCODONE-ACETAMINOPHEN 5-325 MG PO TABS
1.0000 | ORAL_TABLET | Freq: Four times a day (QID) | ORAL | Status: DC | PRN
Start: 2016-01-17 — End: 2016-06-18

## 2016-01-17 NOTE — Progress Notes (Signed)
Pre visit review using our clinic review tool, if applicable. No additional management support is needed unless otherwise documented below in the visit note. 

## 2016-01-17 NOTE — Progress Notes (Signed)
Subjective:    Patient ID: [name], female    DOB: [birth year], 82 y.o.   MRN: [id number]  HPI  82 year old patient seen today for follow-up.  Medical problems include essential hypertension.  She has a history of persistent atrial fibrillation and remains on anticoagulation.  She has been seen by oncology.  Recently and is scheduled for an abdominal and pelvic CT scan next month.  She was doing recently well.  She does describe some mild right lower quadrant discomfort.  Wt Readings from Last 3 Encounters:  01/17/16 184 lb (83.462 kg)  01/09/16 184 lb 8 oz (83.689 kg)  12/19/15 182 lb (82.555 kg)     She has a history of significant arthritis and arthritic pain is her chief complaint.  Her cardiac status has been stable.  Past Medical History  Diagnosis Date  . CAD (coronary artery disease)     a. LHC 4/09: pLAD 40, mDx 70-75, pLCx 30, mLCx 50, mRCA 90, EF 60% >> PCI: BMS x2 to RCA;  b. Myoview 8/12: normal  . Essential hypertension   . Chronic diastolic CHF (congestive heart failure) (HCC)     a. Echo 912 - Mild LVH, EF 55-60%, no RWMA, Gr 1 DD, mild MR, mild LAE, mild RAE, PASP 59 mmHg (mod to severe pulmo HTN)  . History of TIA (transient ischemic attack)     a. Head CT 6/15: small chronic lacunar infarct in thalamus  . Hyperlipidemia   . Depression   . COPD with chronic bronchitis ([place])   . GERD (gastroesophageal reflux disease)   . History of hiatal hernia   . History of DVT of lower extremity   . Sigmoid diverticulosis     s/p sigmoid colectomy  . Colovaginal fistula     s/p colostomy >> colostomy takedown 5/16  . History of adenomatous polyp of colon   . Renal insufficiency   . OA (osteoarthritis)   . Wears glasses   . Wears dentures   . Colostomy in place [facility])     s/p colostomy takedown 5/16  . Peripheral neuropathy ([place])   . Chronic anemia   . History of blood transfusion   . Allergy   . Anxiety   . Cataract     bil cateracts removed  . Pulmonary HTN  ([place])     a. PASP on Echo in 9/12:  59 mmHg  . Dysrhythmia     A fib  . Stroke ([place])     TIAs  . Pneumonia   . Adenocarcinoma, colon [facility]) Oncologist-- Dr Truitt Merle    Multifocal (2) Colon cancer @ ileocecal valve and ascending ( [id number]), Stage IIIB, grade I, MMR normal , 2 of 16 +lymph nodes, negative surgical margins---  06-02-2014  Right hemicolectomy w/ colostomy     Social History   Social History  . Marital Status: Married    Spouse Name: N/A  . Number of Children: N/A  . Years of Education: N/A   Occupational History  . Not on file.   Social History Main Topics  . Smoking status: Former Smoker -- 0.50 packs/day for 15 years    Types: Cigarettes    Quit date: 07/16/1967  . Smokeless tobacco: Never Used  . Alcohol Use: No  . Drug Use: No  . Sexual Activity: Not on file   Other Topics Concern  . Not on file   Social History Narrative   Married, 2 children.  As of November 2015 her husband has  been living in a nursing home for tendon after years as he suffers from multiple medical problems.   Patient does not drink alcohol nor does she smoke or chew tobacco products   Right handed   8th grade   1 cup daily    Past Surgical History  Procedure Laterality Date  . Appendectomy    . Cholecystectomy    . Abdominal hysterectomy    . Lumbar laminectomy  10-29-2002,  1969    Left  L3 -- 4  decompression  . Total knee arthroplasty Bilateral left 1994/  right 2000  . Rotator cuff repair Bilateral   . Cataract extraction w/ intraocular lens  implant, bilateral Bilateral   . Tonsillectomy    . Dilation and curettage of uterus    . Patellectomy Right 09/14/2013    Procedure: PATELLECTOMY;  Surgeon: Meredith Pel, MD;  Location: [place];  Service: Orthopedics;  Laterality: Right;  . Partial colectomy N/A 06/02/2014    Procedure: RIGHT PARTIAL COLECTOMY ;  Surgeon: Donnie Mesa, MD;  Location: [place];  Service: General;  Laterality: N/A;  . Colostomy N/A 06/02/2014     Procedure: DIVERTING DESCENDING END COLOSTOMY;  Surgeon: Donnie Mesa, MD;  Location: [place];  Service: General;  Laterality: N/A;  . Revision total knee arthroplasty Bilateral right  04-02-2011/  left 1996 & 10-05-1999  . Anterior cervical decomp/discectomy fusion  01-31-2009    C5 -- 7  . Cardiovascular stress test  02-26-2011    Normal lexiscan no exercise study/  no ischemia/  normal LV function and wall motion, ef 67%  . Transthoracic echocardiogram  12-01-2009    Grade I diastolic dysfunction/  ef 60%/  moderate MR/  mild TR  . Evaluation under anesthesia with anal fistulectomy N/A 10/13/2014    Procedure: ANAL EXAM UNDER ANESTHESIA ;  Surgeon: [name], MD;  Location: [facility];  Service: General;  Laterality: N/A;  . Proctoscopy N/A 10/13/2014    Procedure: RIDGE PROCTOSCOPY;  Surgeon: [name], MD;  Location: [facility];  Service: General;  Laterality: N/A;  . Coronary angioplasty with stent placement  11-04-2007  dr [email or web address]    BMS x2 to RCA/  mild Non-obstructive disease LAD/  normal LVF  . Bone spurs Bilateral     Feet  . Hand surgery      Tendon repair  . Carpal tunnel release Right   . Laparoscopic sigmoid colectomy N/A 11/24/2014    Procedure: SIGMOID COLECTOMY AND COLSOTOMY CLOSURE;  Surgeon: Donnie Mesa, MD;  Location: [place];  Service: General;  Laterality: N/A;  . Total knee revision Left 08/02/2015    Procedure: LEFT FEMORAL REVISION;  Surgeon: Gaynelle Arabian, MD;  Location: [facility];  Service: Orthopedics;  Laterality: Left;    Family History  Problem Relation Age of Onset  . Osteoarthritis Mother   . Heart failure Mother   . Cancer Sister 40    ? colon cancer   . Kidney cancer Brother 93  . Colon cancer Maternal Uncle 18  . Colon cancer Maternal Uncle   . Esophageal cancer Neg Hx   . Rectal cancer Neg Hx   . Stomach cancer Neg Hx   . Heart Problems Father     No Known Allergies  Current Outpatient Prescriptions on File Prior to Visit  Medication Sig  Dispense Refill  . acetaminophen (TYLENOL) 325 MG tablet Take 650 mg by mouth every 6 (six) hours as needed for moderate pain.     Marland Kitchen apixaban (ELIQUIS)  5 MG TABS tablet Take 1 tablet (5 mg total) by mouth 2 (two) times daily. 180 tablet 3  . benazepril (LOTENSIN) 40 MG tablet TAKE 1 TABLET EVERY DAY 90 tablet 1  . esomeprazole (NEXIUM) 20 MG capsule Take 20 mg by mouth daily at 12 noon.    . ferrous sulfate 325 (65 FE) MG tablet Take 1 tablet (325 mg total) by mouth 2 (two) times daily with a meal.  3  . furosemide (LASIX) 20 MG tablet Take 20 mg daily today and tomorrow then decrease to 20 mg daily on Mondays, Wednesdays and Fridays 20 tablet 3  . gabapentin (NEURONTIN) 600 MG tablet Take 1 tablet by mouth 3 (three) times daily.    . methocarbamol (ROBAXIN) 500 MG tablet Take 1 tablet (500 mg total) by mouth every 6 (six) hours as needed for muscle spasms. 80 tablet 0  . metoprolol (LOPRESSOR) 100 MG tablet Take 1 tablet (100 mg total) by mouth 2 (two) times daily. 180 tablet 3  . nitroGLYCERIN (NITROSTAT) 0.4 MG SL tablet Place 0.4 mg under the tongue every 5 (five) minutes as needed for chest pain (x 3 pills). Reported on 09/01/2015    . Polyethyl Glycol-Propyl Glycol (SYSTANE OP) Place 1 drop into both eyes 2 (two) times daily as needed (for dry eyes).    . promethazine (PHENERGAN) 12.5 MG tablet TAKE 1 TABLET (12.5 MG TOTAL) BY MOUTH EVERY 8 HOURS AS NEEDED FOR NAUSEA OR VOMITING. 20 tablet 1  . sertraline (ZOLOFT) 50 MG tablet TAKE 1 TABLET EVERY DAY 90 tablet 1  . traMADol (ULTRAM) 50 MG tablet Take 1 tablet (50 mg total) by mouth daily as needed. 60 tablet 2  . traZODone (DESYREL) 100 MG tablet TAKE 1/2 TABLET AT BEDTIME 45 tablet 1  . triamcinolone cream (KENALOG) 0.1 % Apply 1 application topically 2 (two) times daily as needed (Eczema on legs). Reported on 07/25/2015     No current facility-administered medications on file prior to visit.    BP 140/90 mmHg  Pulse 84  Temp(Src) 97.9 F  (36.6 C) (Oral)  Resp 20  Ht '5\' 3"'$  (1.6 m)  Wt 184 lb (83.462 kg)  BMI 32.60 kg/m2  SpO2 97%      Review of Systems  Constitutional: Positive for fatigue.  HENT: Negative for congestion, dental problem, hearing loss, rhinorrhea, sinus pressure, sore throat and tinnitus.   Eyes: Negative for pain, discharge and visual disturbance.  Respiratory: Negative for cough and shortness of breath.   Cardiovascular: Negative for chest pain, palpitations and leg swelling.  Gastrointestinal: Positive for abdominal pain. Negative for nausea, vomiting, diarrhea, constipation, blood in stool and abdominal distention.  Genitourinary: Negative for dysuria, urgency, frequency, hematuria, flank pain, vaginal bleeding, vaginal discharge, difficulty urinating, vaginal pain and pelvic pain.  Musculoskeletal: Positive for back pain and arthralgias. Negative for joint swelling and gait problem.  Skin: Negative for rash.  Neurological: Positive for weakness. Negative for dizziness, syncope, speech difficulty, numbness and headaches.  Hematological: Negative for adenopathy.  Psychiatric/Behavioral: Negative for behavioral problems, dysphoric mood and agitation. The patient is not nervous/anxious.        Objective:   Physical Exam  Constitutional: She is oriented to person, place, and time. She appears well-developed and well-nourished.  Repeat blood pressure 130/84  HENT:  Head: Normocephalic.  Right Ear: External ear normal.  Left Ear: External ear normal.  Mouth/Throat: Oropharynx is clear and moist.  Eyes: Conjunctivae and EOM are normal. Pupils are equal, round,  and reactive to light.  Neck: Normal range of motion. Neck supple. No thyromegaly present.  Cardiovascular: Normal rate, regular rhythm, normal heart sounds and intact distal pulses.   Pulmonary/Chest: Effort normal and breath sounds normal.  Abdominal: Soft. Bowel sounds are normal. She exhibits no distension and no mass. There is no  tenderness. There is no rebound and no guarding.  Obese soft and nontender Surgical scars noted  Musculoskeletal: Normal range of motion.  Lymphadenopathy:    She has no cervical adenopathy.  Neurological: She is alert and oriented to person, place, and time.  Skin: Skin is warm and dry. No rash noted.  Psychiatric: She has a normal mood and affect. Her behavior is normal.          Assessment & Plan:   , essential hypertension, stable .  History of colon cancer  osteoarthritis.  Continue symptomatic treatment .  Persistent atrial fibrillation.  Continue anticoagulation.  Follow-up cardiology  .  Medications updated  .  Return here in 6 months for follow-up or as needed  Nyoka Cowden, MD

## 2016-01-17 NOTE — Patient Instructions (Signed)
Limit your sodium (Salt) intake  Return in 6 months for follow-up  

## 2016-01-30 ENCOUNTER — Telehealth: Payer: Self-pay | Admitting: Internal Medicine

## 2016-01-30 ENCOUNTER — Other Ambulatory Visit: Payer: Self-pay | Admitting: Internal Medicine

## 2016-01-30 DIAGNOSIS — M25512 Pain in left shoulder: Principal | ICD-10-CM

## 2016-01-30 DIAGNOSIS — M25511 Pain in right shoulder: Secondary | ICD-10-CM

## 2016-01-30 NOTE — Telephone Encounter (Signed)
Pt would like to have a referral to Dr. Alphonzo Severance with [facility] [phone] for her shoulders.

## 2016-01-30 NOTE — Telephone Encounter (Signed)
Rx refill sent to pharmacy. 

## 2016-01-30 NOTE — Telephone Encounter (Signed)
Okay to refill? 

## 2016-01-31 NOTE — Telephone Encounter (Signed)
Okay for referral?

## 2016-01-31 NOTE — Telephone Encounter (Signed)
Okay to refill? 

## 2016-01-31 NOTE — Telephone Encounter (Signed)
Referral placed and patient notified.  

## 2016-02-01 ENCOUNTER — Telehealth: Payer: Self-pay | Admitting: Cardiovascular Disease

## 2016-02-01 NOTE — Telephone Encounter (Signed)
New Message   Pt c/o medication issue:  1. Name of Medication: Eliquis   2. How are you currently taking this medication (dosage and times per day)? 5mg  2x daily   3. Are you having a reaction (difficulty breathing--STAT)? Pt daughter states pt is having severe joint pan, pt can barely lifts arms.  4. What is your medication issue? Pt daughter would like a call back to see what pt can do about side effects. Please call back to discuss

## 2016-02-01 NOTE — Telephone Encounter (Signed)
Spoke with pt's grand daughter. She reports pt is complaining of increased shoulder pain recently. Feels it has gotten worse since starting Eliquis. [place] daughter states she read possible side effects for Eliquis and joint pain was listed. I reviewed with Nehemiah Massed, PharmD and shoulder/joint pain is not usual side effect of Eliqius. I told pt's grand daughter  joint pain was probably not related to Eliquis. [place] daughter reports pt did have bilateral shoulder surgery 30 years ago. Primary care has referred her to orthopedics.  I encouraged her to have pt continue Eliquis and keep appt with orthopedics. [place] daughter requests I check with Dr. Angelena Form regarding joint pain as possible side effect.  I told her I would forward to Dr. Angelena Form to review when back in office next week.

## 2016-02-02 NOTE — Telephone Encounter (Signed)
Agree that this should not be a side effect from Eliquis. Thanks, chris

## 2016-02-02 NOTE — Telephone Encounter (Signed)
I spoke with pt's grand daughter and gave her information from Dr. Angelena Form.

## 2016-02-02 NOTE — Telephone Encounter (Signed)
Left message to call back  

## 2016-03-01 ENCOUNTER — Other Ambulatory Visit: Payer: Self-pay | Admitting: Hematology

## 2016-03-01 ENCOUNTER — Other Ambulatory Visit (HOSPITAL_BASED_OUTPATIENT_CLINIC_OR_DEPARTMENT_OTHER): Payer: Commercial Managed Care - HMO

## 2016-03-01 DIAGNOSIS — C182 Malignant neoplasm of ascending colon: Secondary | ICD-10-CM | POA: Diagnosis not present

## 2016-03-01 LAB — COMPREHENSIVE METABOLIC PANEL
ALT: 19 U/L (ref 0–55)
AST: 23 U/L (ref 5–34)
Albumin: 3.7 g/dL (ref 3.5–5.0)
Alkaline Phosphatase: 79 U/L (ref 40–150)
Anion Gap: 9 mEq/L (ref 3–11)
BUN: 32.5 mg/dL — ABNORMAL HIGH (ref 7.0–26.0)
CO2: 25 mEq/L (ref 22–29)
Calcium: 9.4 mg/dL (ref 8.4–10.4)
Chloride: 106 mEq/L (ref 98–109)
Creatinine: 1.3 mg/dL — ABNORMAL HIGH (ref 0.6–1.1)
EGFR: 38 mL/min/{1.73_m2} — ABNORMAL LOW (ref >90)
Glucose: 105 mg/dl (ref 70–140)
Potassium: 4.7 mEq/L (ref 3.5–5.1)
Sodium: 140 mEq/L (ref 136–145)
Total Bilirubin: 0.36 mg/dL (ref 0.20–1.20)
Total Protein: 7.6 g/dL (ref 6.4–8.3)

## 2016-03-01 LAB — CBC WITH DIFFERENTIAL/PLATELET
BASO%: 0.3 % (ref 0.0–2.0)
Basophils Absolute: 0 10*3/uL (ref 0.0–0.1)
EOS%: 1.1 % (ref 0.0–7.0)
Eosinophils Absolute: 0.1 10*3/uL (ref 0.0–0.5)
HCT: 41.9 % (ref 34.8–46.6)
HGB: 13.5 g/dL (ref 11.6–15.9)
LYMPH%: 22.5 % (ref 14.0–49.7)
MCH: 28.8 pg (ref 25.1–34.0)
MCHC: 32.2 g/dL (ref 31.5–36.0)
MCV: 89.5 fL (ref 79.5–101.0)
MONO#: 0.7 10*3/uL (ref 0.1–0.9)
MONO%: 9.2 % (ref 0.0–14.0)
NEUT#: 4.7 10*3/uL (ref 1.5–6.5)
NEUT%: 66.9 % (ref 38.4–76.8)
Platelets: 123 10*3/uL — ABNORMAL LOW (ref 145–400)
RBC: 4.68 10*6/uL (ref 3.70–5.45)
RDW: 15.4 % — ABNORMAL HIGH (ref 11.2–14.5)
WBC: 7 10*3/uL (ref 3.9–10.3)
lymph#: 1.6 10*3/uL (ref 0.9–3.3)
nRBC: 0 % (ref 0–0)

## 2016-03-01 LAB — CEA (IN HOUSE-CHCC): CEA (CHCC-In House): 3.05 ng/mL (ref 0.00–5.00)

## 2016-03-02 LAB — CEA: CEA: 3.1 ng/mL (ref 0.0–4.7)

## 2016-03-06 ENCOUNTER — Telehealth: Payer: Self-pay | Admitting: *Deleted

## 2016-03-06 ENCOUNTER — Ambulatory Visit (HOSPITAL_COMMUNITY)
Admission: RE | Admit: 2016-03-06 | Discharge: 2016-03-06 | Disposition: A | Discharge status: Home / Self Care | Payer: Commercial Managed Care - HMO | Source: Ambulatory Visit | Attending: Hematology | Admitting: Hematology

## 2016-03-06 DIAGNOSIS — Z9049 Acquired absence of other specified parts of digestive tract: Secondary | ICD-10-CM | POA: Insufficient documentation

## 2016-03-06 DIAGNOSIS — M419 Scoliosis, unspecified: Secondary | ICD-10-CM | POA: Insufficient documentation

## 2016-03-06 DIAGNOSIS — C182 Malignant neoplasm of ascending colon: Secondary | ICD-10-CM | POA: Insufficient documentation

## 2016-03-06 DIAGNOSIS — I700 Atherosclerosis of aorta: Secondary | ICD-10-CM | POA: Diagnosis not present

## 2016-03-06 DIAGNOSIS — I2510 Atherosclerotic heart disease of native coronary artery without angina pectoris: Secondary | ICD-10-CM | POA: Diagnosis not present

## 2016-03-06 DIAGNOSIS — K449 Diaphragmatic hernia without obstruction or gangrene: Secondary | ICD-10-CM | POA: Diagnosis not present

## 2016-03-06 NOTE — Telephone Encounter (Signed)
"  I missed a call from this number and would like to know what it was."  Informed patient of next appointment date and time.  No further questions.

## 2016-03-08 ENCOUNTER — Encounter: Payer: Self-pay | Admitting: Hematology

## 2016-03-08 ENCOUNTER — Ambulatory Visit (HOSPITAL_BASED_OUTPATIENT_CLINIC_OR_DEPARTMENT_OTHER): Payer: Commercial Managed Care - HMO | Admitting: Hematology

## 2016-03-08 ENCOUNTER — Telehealth: Payer: Self-pay | Admitting: Hematology

## 2016-03-08 VITALS — BP 141/83 | HR 75 | Temp 98.5°F | Resp 17 | Ht 63.0 in | Wt 183.1 lb

## 2016-03-08 DIAGNOSIS — I2510 Atherosclerotic heart disease of native coronary artery without angina pectoris: Secondary | ICD-10-CM

## 2016-03-08 DIAGNOSIS — C182 Malignant neoplasm of ascending colon: Secondary | ICD-10-CM | POA: Diagnosis not present

## 2016-03-08 DIAGNOSIS — I10 Essential (primary) hypertension: Secondary | ICD-10-CM

## 2016-03-08 NOTE — Telephone Encounter (Signed)
GAVE PATIENT AVS REPORT AND APPOINTMENTS FOR November.  °

## 2016-03-08 NOTE — Progress Notes (Signed)
[name]  [phone] [phone]  Clinic Follow Up Note   Patient Care Team: Marletta Lor, MD as PCP - General (Internal Medicine) 03/08/2016  CHIEF COMPLAINTS:  Follow up multifocal colon cancer    Cancer of ascending colon ([place])   06/02/2014 Initial Diagnosis    Adenocarcinoma, colon. she presented with anemia, nauseam anorexia and abdominal pain and was [facility]2014.      06/02/2014 Surgery    exploratory laparotomy, right hemicolectomy, and descending colostomy by Dr. Georgette Dover      06/02/2014 Pathologic Stage    multifocal (2) colon cancer at  ileocecal valve and ascending colon.,  mT4N1Mx, at least stage IIIB, grade 1, MMR normal, surgical margins negative.       06/02/2014 Tumor Marker    CEA 1.7      11/24/2014 Surgery    SIGMOID COLECTOMY AND COLSOTOMY CLOSURE with Donnie Mesa        HISTORY OF PRESENTING ILLNESS:  [name] 82 y.o. female is here because of recently diagnosed and resected multifocal colon cancer. I saw him when she was admitted to the hospital, and she is here with her family members for follow-up.  She has a history of chronic anemia and colovaginal fistula as well as diverticulosis with colonic polyps since 2008, was admitted to [facility] on 05/31/2014 With progressive abdominal pain, worse over the last couple of weeks, accompanied by nausea and vomiting, without weight loss. Workup included a CT of the abdomen and pelvis with contrast, which revealed acute small bowel obstruction due to what appears to be a soft tissue mass (apple core lesion) at the ileocecal valve suspicious for intestinal adenocarcinoma.This mass measures 6 x 4 cm. Up to 8 mm stable pericecal lymph nodes were visualized. No liver or distant metastasis identified. Small volume free fluid in the abdomen and pelvis, Likely reactive, was seen. No free air.  She underwent exploratory laparotomy, right  hemicolectomy, and descending colostomy by Dr. Georgette Dover on 06/02/2014. Grossly, there are two tumors identified. The larger tumor is located at the ileocecal valve and consists of an 8.5 cm span of moderately differentiated adenocarcinoma that involves the serosa. This tumor has associated perineural and lymphovascular invasion. The second discontiguous tumor is present in the proximal right colon and spans 4.0 cm and consists of well differentiated adenocarcinoma that is morphologically dissimilar from the larger tumor. This tumor extends into the muscularis propria.Her CEA is 1.7. It is stage mT4, N1b. immunohistochemical stains are normal, with very low probability of microsatellite instability (See details below ).  She was discharged to rehabilitation after her surgery. She has been doing well at to rehabilitation. She had a surgical wound infection a few weeks after surgery, which was treated with oral antibiotics and wound care. She was not very physically active before the surgery due to her multiple arthritis, she is recovering well at the nursing home and is likely going home in a few days. She is able to move around with a walker, has good appetite, surgical site pain is controlled, no nausea or other complaints. She did developed some UTI symptoms, and started Cipro and probiotics yesterday at the nursing home.  CURRENT THERAPY: Surveillance  INTERIM HISTORY She returns for follow up, she is accompanied by her daughter to the clinic today. She still complains of right rib cage pain and bilateral low pelvic pain, persistent, mild to moderate, overall stable, she takes tylenol and hydrocordone as needed for  the pain. She also has intermittent left frontal chest pain, she has had stent placed several years ago. She is being seen Dr. Alben Spittle lately. No other new complaints.   MEDICAL HISTORY:  Past Medical History:  Diagnosis Date  . Adenocarcinoma, colon [facility]) Oncologist-- Dr Malachy Mood    Multifocal (2) Colon cancer @ ileocecal valve and ascending ( mT4N1Mx), Stage IIIB, grade I, MMR normal , 2 of 16 +lymph nodes, negative surgical margins---  06-02-2014  Right hemicolectomy w/ colostomy  . Allergy   . Anxiety   . CAD (coronary artery disease)    a. LHC 4/09: pLAD 40, mDx 70-75, pLCx 30, mLCx 50, mRCA 90, EF 60% >> PCI: BMS x2 to RCA;  b. Myoview 8/12: normal  . Cataract    bil cateracts removed  . Chronic anemia   . Chronic diastolic CHF (congestive heart failure) (HCC)    a. Echo 912 - Mild LVH, EF 55-60%, no RWMA, Gr 1 DD, mild MR, mild LAE, mild RAE, PASP 59 mmHg (mod to severe pulmo HTN)  . Colostomy in place [facility])    s/p colostomy takedown 5/16  . Colovaginal fistula    s/p colostomy >> colostomy takedown 5/16  . COPD with chronic bronchitis (HCC)   . Depression   . Dysrhythmia    A fib  . Essential hypertension   . GERD (gastroesophageal reflux disease)   . History of adenomatous polyp of colon   . History of blood transfusion   . History of DVT of lower extremity   . History of hiatal hernia   . History of TIA (transient ischemic attack)    a. Head CT 6/15: small chronic lacunar infarct in thalamus  . Hyperlipidemia   . OA (osteoarthritis)   . Peripheral neuropathy (HCC)   . Pneumonia   . Pulmonary HTN (HCC)    a. PASP on Echo in 9/12:  59 mmHg  . Renal insufficiency   . Sigmoid diverticulosis    s/p sigmoid colectomy  . Stroke (HCC)    TIAs  . Wears dentures   . Wears glasses     SURGICAL HISTORY: Past Surgical History:  Procedure Laterality Date  . ABDOMINAL HYSTERECTOMY    . ANTERIOR CERVICAL DECOMP/DISCECTOMY FUSION  01-31-2009   C5 -- 7  . APPENDECTOMY    . Bone spurs Bilateral    Feet  . CARDIOVASCULAR STRESS TEST  02-26-2011   Normal lexiscan no exercise study/  no ischemia/  normal LV function and wall motion, ef 67%  . CARPAL TUNNEL RELEASE Right   . CATARACT EXTRACTION W/ INTRAOCULAR LENS  IMPLANT, BILATERAL Bilateral   .  CHOLECYSTECTOMY    . COLOSTOMY N/A 06/02/2014   Procedure: DIVERTING DESCENDING END COLOSTOMY;  Surgeon: Manus Rudd, MD;  Location: [facility] OR;  Service: General;  Laterality: N/A;  . CORONARY ANGIOPLASTY WITH STENT PLACEMENT  11-04-2007  dr bensimhon   BMS x2 to RCA/  mild Non-obstructive disease LAD/  normal LVF  . DILATION AND CURETTAGE OF UTERUS    . EVALUATION UNDER ANESTHESIA WITH ANAL FISTULECTOMY N/A 10/13/2014   Procedure: ANAL EXAM UNDER ANESTHESIA ;  Surgeon: Romie Levee, MD;  Location: [facility] ORS;  Service: General;  Laterality: N/A;  . HAND SURGERY     Tendon repair  . LAPAROSCOPIC SIGMOID COLECTOMY N/A 11/24/2014   Procedure: SIGMOID COLECTOMY AND COLSOTOMY CLOSURE;  Surgeon: Manus Rudd, MD;  Location: [facility] OR;  Service: General;  Laterality: N/A;  . LUMBAR LAMINECTOMY  10-29-2002,  1969  Left  L3 -- 4  decompression  . PARTIAL COLECTOMY N/A 06/02/2014   Procedure: RIGHT PARTIAL COLECTOMY ;  Surgeon: Manus Rudd, MD;  Location: [facility] OR;  Service: General;  Laterality: N/A;  . PATELLECTOMY Right 09/14/2013   Procedure: PATELLECTOMY;  Surgeon: Cammy Copa, MD;  Location: [facility] OR;  Service: Orthopedics;  Laterality: Right;  . PROCTOSCOPY N/A 10/13/2014   Procedure: RIDGE PROCTOSCOPY;  Surgeon: Romie Levee, MD;  Location: [facility] ORS;  Service: General;  Laterality: N/A;  . REVISION TOTAL KNEE ARTHROPLASTY Bilateral right  04-02-2011/  left 1996 & 10-05-1999  . ROTATOR CUFF REPAIR Bilateral   . TONSILLECTOMY    . TOTAL KNEE ARTHROPLASTY Bilateral left 1994/  right 2000  . TOTAL KNEE REVISION Left 08/02/2015   Procedure: LEFT FEMORAL REVISION;  Surgeon: Ollen Gross, MD;  Location: [facility] ORS;  Service: Orthopedics;  Laterality: Left;  . TRANSTHORACIC ECHOCARDIOGRAM  12-01-2009   Grade I diastolic dysfunction/  ef 60%/  moderate MR/  mild TR    SOCIAL HISTORY: Social History   Social History  . Marital status: Married    Spouse name: N/A  . Number of children: N/A  . Years of  education: N/A   Occupational History  . Not on file.   Social History Main Topics  . Smoking status: Former Smoker    Packs/day: 0.50    Years: 15.00    Types: Cigarettes    Quit date: 07/16/1967  . Smokeless tobacco: Never Used  . Alcohol use No  . Drug use: No  . Sexual activity: Not on file   Other Topics Concern  . Not on file   Social History Narrative   Married, 2 children.  As of November 2015 her husband has been living in a nursing home for tendon after years as he suffers from multiple medical problems.   Patient does not drink alcohol nor does she smoke or chew tobacco products   Right handed   8th grade   1 cup daily   She worked for [employer] for 40 years.    FAMILY HISTORY: Family History  Problem Relation Age of Onset  . Osteoarthritis Mother   . Heart failure Mother   . Cancer Sister 41    ? colon cancer   . Kidney cancer Brother 30  . Colon cancer Maternal Uncle 70  . Colon cancer Maternal Uncle   . Esophageal cancer Neg Hx   . Rectal cancer Neg Hx   . Stomach cancer Neg Hx   . Heart Problems Father     ALLERGIES:  has No Known Allergies.  MEDICATIONS:  Current Outpatient Prescriptions  Medication Sig Dispense Refill  . acetaminophen (TYLENOL) 325 MG tablet Take 650 mg by mouth every 6 (six) hours as needed for moderate pain.     Marland Kitchen apixaban (ELIQUIS) 5 MG TABS tablet Take 1 tablet (5 mg total) by mouth 2 (two) times daily. 180 tablet 3  . benazepril (LOTENSIN) 40 MG tablet TAKE 1 TABLET EVERY DAY 90 tablet 1  . esomeprazole (NEXIUM) 20 MG capsule Take 20 mg by mouth daily at 12 noon.    . ferrous sulfate 325 (65 FE) MG tablet Take 1 tablet (325 mg total) by mouth 2 (two) times daily with a meal.  3  . furosemide (LASIX) 20 MG tablet Take 20 mg daily today and tomorrow then decrease to 20 mg daily on Mondays, Wednesdays and Fridays 20 tablet 3  . gabapentin (NEURONTIN) 600 MG tablet TAKE  1 TABLET THREE TIMES DAILY 270 tablet 3  . LORazepam  (ATIVAN) 0.5 MG tablet TAKE ONE TABLET BY MOUTH TWICE DAILY AS NEEDED FOR ANXIETY OR SLEEP 60 tablet 5  . methocarbamol (ROBAXIN) 500 MG tablet Take 1 tablet (500 mg total) by mouth every 6 (six) hours as needed for muscle spasms. 80 tablet 0  . metoprolol (LOPRESSOR) 100 MG tablet Take 1 tablet (100 mg total) by mouth 2 (two) times daily. 180 tablet 3  . Polyethyl Glycol-Propyl Glycol (SYSTANE OP) Place 1 drop into both eyes 2 (two) times daily as needed (for dry eyes).    . promethazine (PHENERGAN) 12.5 MG tablet TAKE 1 TABLET (12.5 MG TOTAL) BY MOUTH EVERY 8 HOURS AS NEEDED FOR NAUSEA OR VOMITING. 20 tablet 1  . sertraline (ZOLOFT) 50 MG tablet TAKE 1 TABLET EVERY DAY 90 tablet 1  . traMADol (ULTRAM) 50 MG tablet Take 1 tablet (50 mg total) by mouth daily as needed. 60 tablet 2  . traZODone (DESYREL) 100 MG tablet TAKE 1/2 TABLET AT BEDTIME 45 tablet 1  . triamcinolone cream (KENALOG) 0.1 % Apply 1 application topically 2 (two) times daily as needed (Eczema on legs). Reported on 07/25/2015    . HYDROcodone-acetaminophen (NORCO/VICODIN) 5-325 MG tablet Take 1 tablet by mouth every 6 (six) hours as needed for moderate pain. (Patient not taking: Reported on 03/08/2016) 60 tablet 0  . nitroGLYCERIN (NITROSTAT) 0.4 MG SL tablet Place 0.4 mg under the tongue every 5 (five) minutes as needed for chest pain (x 3 pills). Reported on 09/01/2015     No current facility-administered medications for this visit.     REVIEW OF SYSTEMS:   Constitutional: Denies fevers, chills or abnormal night sweats Eyes: Denies blurriness of vision, double vision or watery eyes Ears, nose, mouth, throat, and face: Denies mucositis or sore throat Respiratory: Denies cough, dyspnea or wheezes Cardiovascular: Denies palpitation, chest discomfort or lower extremity swelling Gastrointestinal:  Denies nausea, heartburn or change in bowel habits Skin: Denies abnormal skin rashes Lymphatics: Denies new lymphadenopathy or easy  bruising Neurological:Denies numbness, tingling or new weaknesses Behavioral/Psych: Mood is stable, no new changes  All other systems were reviewed with the patient and are negative.  PHYSICAL EXAMINATION: ECOG PERFORMANCE STATUS: 3  Vitals:   03/08/16 0942  BP: (!) 141/83  Pulse: 75  Resp: 17  Temp: 98.5 F (36.9 C)   Filed Weights   03/08/16 0942  Weight: 183 lb 1.6 oz (83.1 kg)    GENERAL:alert, no distress and comfortable SKIN: skin color, texture, turgor are normal, no rashes or significant lesions EYES: normal, conjunctiva are pink and non-injected, sclera clear OROPHARYNX:no exudate, no erythema and lips, buccal mucosa, and tongue normal  NECK: supple, thyroid normal size, non-tender, without nodularity LYMPH:  no palpable lymphadenopathy in the cervical, axillary or inguinal LUNGS: clear to auscultation and percussion with normal breathing effort HEART: regular rate & rhythm and no murmurs and no lower extremity edema ABDOMEN:abdomen soft, the midline surgical wound is well healed, appears clean. Mild tenderness at RLQ  and normal bowel sounds Musculoskeletal:no cyanosis of digits and no clubbing  PSYCH: alert & oriented x 3 with fluent speech NEURO: no focal motor/sensory deficits.   LABORATORY DATA:  I have reviewed the data as listed CBC Latest Ref Rng & Units 03/01/2016 01/09/2016 09/21/2015  WBC 3.9 - 10.3 10e3/uL 7.0 6.1 7.5  Hemoglobin 11.6 - 15.9 g/dL 13.5 12.6 12.3  Hematocrit 34.8 - 46.6 % 41.9 39.5 37.8  Platelets 145 -  400 10e3/uL 123(L) 134(L) 221    CMP Latest Ref Rng & Units 03/01/2016 01/09/2016 09/21/2015  Glucose 70 - 140 mg/dl 105 95 95  BUN 7.0 - 26.0 mg/dL 32.5(H) 26.6(H) 35(H)  Creatinine 0.6 - 1.1 mg/dL 1.3(H) 1.4(H) 1.43(H)  Sodium 136 - 145 mEq/L 140 140 139  Potassium 3.5 - 5.1 mEq/L 4.7 4.9 5.1  Chloride 98 - 110 mmol/L - - 102  CO2 22 - 29 mEq/L '25 27 27  '$ Calcium 8.4 - 10.4 mg/dL 9.4 9.1 9.5  Total Protein 6.4 - 8.3 g/dL 7.6 7.3 -    Total Bilirubin 0.20 - 1.20 mg/dL 0.36 0.35 -  Alkaline Phos 40 - 150 U/L 79 75 -  AST 5 - 34 U/L 23 24 -  ALT 0 - 55 U/L 19 19 -   Results for CORNESHIA, HINES (MRN [id number]) as of 03/08/2016 10:16  Ref. Range 09/01/2015 14:03 01/09/2016 09:20 03/01/2016 08:50  CEA Latest Units: ng/mL 1.6 1.5   CEA Latest Ref Range: 0.0 - 4.7 ng/mL 2.6 2.5 3.1  CEA ([facility]) Latest Ref Range: 0.00 - 5.00 ng/mL   3.05    Pathology report: 06/02/2014 Colon, segmental resection for tumor, right - MULTIFOCAL INVASIVE ADENOCARCINOMA, SPANNING 8.5 CM AND 4.0 CM. - ADENOCARCINOMA INVOLVES THE SEROSA. - LYMPHOVASCULAR INVASION IS IDENTIFIED. 1 of 4 FINAL for Walko, Whitney C ([id number]) Diagnosis(continued) - PERINEURAL INVASION IS IDENTIFIED. - METASTATIC ADENOCARCINOMA IN 2 OF 16 LYMPH NODES (2/16) WITH EXTRACAPSULAR EXTENSION. - THE SURGICAL RESECTION MARGINS ARE NEGATIVE FOR ADENOCARCINOMA. - SEE ONCOLOGY TABLE BELOW. Microscopic Comment COLON AND RECTUM (INCLUDING TRANS-ANAL RESECTION): Specimen: Terminal ileum and right colon Procedure: Resection Tumor site: Ileocecal valve (tumor #1) and proximal mid ascending colon (tumor #2) Specimen integrity: Intact Macroscopic intactness of mesorectum: N/A Macroscopic tumor perforation: Not identified Invasive tumor: Tumor #1 (ileocecal valve) Maximum size: 8.5 cm (gross measurement) Histologic type(s): Adenocarcinoma Histologic grade and differentiation: G2: moderately differentiated/low grade Type of polyp in which invasive carcinoma arose: Likely tubular adenoma Microscopic extension of invasive tumor: Adenocarcinoma involves the serosa Invasive tumor: Tumor #2 (proximal mid ascending colon) Maximum size: 4.0 cm (gross measurement) Histologic type(s): Adenocarcinoma Histologic grade and differentiation: G1: well differentiated/low grade Type of polyp in which invasive carcinoma arose: Tubulovillous adenoma Microscopic extension of invasive tumor:  Adenocarcinoma extends into the muscularis propria Lymph-Vascular invasion: Present Peri-neural invasion: Present Tumor deposit(s) (discontinuous extramural extension): Not identified Resection margins: Proximal margin: 32.0 cm Distal margin: 7.5 cm Circumferential (radial) (posterior ascending, posterior descending; lateral and posterior mid-rectum; and entire lower 1/3 rectum): 3.0 cm Treatment effect (neo-adjuvant therapy): N/A Additional polyp(s): Not identified Non-neoplastic findings: No significant findings Lymph nodes: number examined 16; number positive: 2 Pathologic Staging: mT4, N1b Ancillary studies: MSI testing will be performed on the larger higher grade tumor and the results reported separately. Additional studies can be performed upon clinician request. Comments: Grossly, there are two tumors identified. The larger tumor is located at the ileocecal valve and consists of an 8.5 cm span of moderately differentiated adenocarcinoma that involves the serosa. This tumor has associated perineural and lymphovascular invasion. The second discontiguous tumor is present in the proximal right colon and spans 4.0 cm and consists of well differentiated adenocarcinoma that is morphologically dissimilar from the larger tumor. This tumor extends into the muscularis propria. Enid Cutter MD Pathologist, Electronic Signature (Case signed 06/06/2014) Specimen Gross and Clinical Information   RADIOGRAPHIC STUDIES: I have personally reviewed the radiological images as listed and agreed with the findings  in the report.  Ct abdomen and pelvis with contrast 03/06/2016 IMPRESSION: 1. Stable remote surgical changes from a partial right hemicolectomy. No CT findings for recurrent tumor or metastatic disease. 2. Stable atherosclerotic calcifications involving the thoracic aorta, abdominal aorta and branch vessels including dense 3 vessel coronary artery calcifications. 3. Moderate to large  hiatal hernia. 4. Scoliosis and degenerative changes involving the spine. 5. No acute findings in the chest, abdomen or pelvis.   ASSESSMENT & PLAN:  82 year old Caucasian female, with multiple medical comorbidities, presented with anemia, abdominal pain and nausea. She underwent exploratory laparotomy, right hemicolectomy, and descending colostomy by Dr. Georgette Dover on 06/02/2014. Surgical path reviewed multifocal (2) colon cancer at  ileocecal valve and ascending colon.  1. Multifocal invasive colon adenocarcinoma, mT4N1Mx, at least stage IIIB, grade 1, MMR normal. -I reviewed her surgical findings and her CT of abdomen and pelvis with patient and her extended family members in great detail today. Both of her colon cancer were completely surgically resected, however she does have high risk of cancer recurrence in the future due to locally advanced stage.  -Her surveillance CT scan from 03/06/2016 showed no evidence of recurrence, I discussed the scan findings with patient and her daughter. -I reviewed her lab today. Her CBC and CMP are unremarkable except mild and creatinine, CEA has been normal. -Her clinical exam was unremarkable, no evidence of recurrence. -We'll continue surveillance.  -She attempted colonoscopy, but procedure was aborted due to her high blood pressure and complicated cardiac history.   2. Genetics -Due to her family history of colon cancer, and her daughters multifocal polyps, I recommend her to have a genetic counseling. Her daughter met our genetic counselor before and genetic testing was negative. -She declined genetic testing at this point.  3. Anemia -resolved now  -serum iron and ferritin were normal  -Continue oral iron supplement.   4. CAD, CHF -She will continue follow-up with her cardiologist Dr. Kathlen Mody -I discussed the CT scan findings of atherosclerosis, which involves her aorta and 3 coronary artery vessel, she does have frequent chest pain, I strongly  encouraged her to see Dr. Kathlen Mody soon.  Follow-up with her primary care physician for other medical issues.  Plan return to clinic in 3 month with labs and exam   All questions were answered. The patient knows to call the clinic with any problems, questions or concerns.  I spent 20 minutes counseling the patient face to face. The total time spent in the appointment was 25 minutes and more than 50% was on counseling.     Truitt Merle, MD 03/08/2016 10:02 AM

## 2016-03-11 ENCOUNTER — Encounter: Payer: Self-pay | Admitting: Physician Assistant

## 2016-03-26 ENCOUNTER — Ambulatory Visit (INDEPENDENT_AMBULATORY_CARE_PROVIDER_SITE_OTHER): Payer: Medicare HMO | Admitting: Physician Assistant

## 2016-03-26 ENCOUNTER — Ambulatory Visit: Payer: Commercial Managed Care - HMO | Admitting: Physician Assistant

## 2016-03-26 ENCOUNTER — Encounter: Payer: Self-pay | Admitting: Physician Assistant

## 2016-03-26 ENCOUNTER — Telehealth: Payer: Self-pay | Admitting: Physician Assistant

## 2016-03-26 VITALS — BP 140/80 | HR 98 | Ht 63.0 in | Wt 184.0 lb

## 2016-03-26 DIAGNOSIS — I2510 Atherosclerotic heart disease of native coronary artery without angina pectoris: Secondary | ICD-10-CM

## 2016-03-26 DIAGNOSIS — I4819 Other persistent atrial fibrillation: Secondary | ICD-10-CM

## 2016-03-26 DIAGNOSIS — I10 Essential (primary) hypertension: Secondary | ICD-10-CM | POA: Diagnosis not present

## 2016-03-26 DIAGNOSIS — I5032 Chronic diastolic (congestive) heart failure: Secondary | ICD-10-CM

## 2016-03-26 DIAGNOSIS — I481 Persistent atrial fibrillation: Secondary | ICD-10-CM

## 2016-03-26 DIAGNOSIS — R0789 Other chest pain: Secondary | ICD-10-CM

## 2016-03-26 MED ORDER — DILTIAZEM HCL 30 MG PO TABS
30.0000 mg | ORAL_TABLET | Freq: Three times a day (TID) | ORAL | 6 refills | Status: DC
Start: 2016-03-26 — End: 2016-10-23

## 2016-03-26 MED ORDER — NITROGLYCERIN 0.4 MG SL SUBL: 0.4000 mg | SUBLINGUAL_TABLET | SUBLINGUAL | 3 refills | Status: DC | PRN

## 2016-03-26 MED ORDER — DILTIAZEM HCL 30 MG PO TABS: 30.0000 mg | ORAL_TABLET | Freq: Three times a day (TID) | ORAL | 6 refills | Status: DC

## 2016-03-26 NOTE — Patient Instructions (Addendum)
Medication Instructions: Your physician has recommended you make the following change in your medication:   1. Start CARDIZEM 1 tablet (30 mg) by mouth every 8 hours - three times daily --- A new prescription for your Nitroglycerin has been sent in    Labwork: None Ordered  Procedures/Testing: None Ordered  Follow-Up: Your physician recommends that you schedule a follow-up appointment in 3-4 weeks with an APP/Extender on a day that Dr. Angelena Form is in clinic   Any Additional Special Instructions Will Be Listed Below (If Applicable).     If you need a refill on your cardiac medications before your next appointment, please call your pharmacy.

## 2016-03-26 NOTE — Progress Notes (Signed)
Cardiology Office Note    Date:  03/26/2016   ID:  Gerhard Munch Judson, DOB 05/02/1930, MRN [id number]  PCP:  Nyoka Cowden, MD  Cardiologist:  Dr. Lauree Chandler   Electrophysiologist: n/a  Chief Complaint: Chest pain  History of Present Illness:   [name] is a 82 y.o. female with history of CAD s/p BMS to RCA x 2 in 8032, diastolic CHF, HTN, HLD, persistent afib on Eliquis, chronic dyspnea on exertion, COPD, pulmonary HTN, ? prior TIA, prior DVT, colon CA who presented for evaluation of chest pain.   Colon cancer diagnosed in 11/15. She underwent right hemicolectomy with diverting colostomy. She saw Dr. Domenic Polite in 5/16 and was cleared from a cardiac perspective for colostomy takedown and sigmoid colectomy for diverticulosis. She was seen by Lyda Jester, PA-C 12/16 for surgical clearance (for L TKR). ECG was noted to demonstrate new onset AFib with controlled VR. She underwent L femoral revision of TKR 08/02/15 with Dr. Wynelle Link.I saw her in FU 2/17. Eliquis started after knee replacement. She has been on metoprolol for rate control.   She was doing well overall on cardiac perspective when last seen by Dr. Angelena Form 12/19/15. She has chronic DOE.   The patient was seen by oncology (Dr. Truitt Merle 03/08/16). Her surveillance CT scan from 03/06/2016 showed no evidence of recurrence. She was advised to followed up with cardiology due to continuous intermittent left chest pain and CT of Chest 02/2016 showed stable atherosclerotic calcifications in aorta, abdominal aorta and 3 vessels coronary artery.   Last Myoview 02/2011 was normal. Last echo 07/2015 with normal LVEF (50-55%) and normal wall motion.   Here today for follow up. The patient continued to have DOE that has been getting worse. Admits to having intermittent sharp left sided chest pain and palpitations. Her chest pain occurs with and without palpitations. Her symptoms usually last for 2-3 minutes and resolved by  itself. Last week she woke with chest pain and dyspnea. No radiation of pain, nausea, vomiting. This was worse episode that lasted about 30 minutes. States that she runs out of SL nitro currently has taken SL nitro about few weeks ago and it did helped. States that she can not afford Eliquis. She is in donut hole.    Past Medical History:  Diagnosis Date  . Adenocarcinoma, colon [facility]) Oncologist-- Dr Truitt Merle   Multifocal (2) Colon cancer @ ileocecal valve and ascending ( [id number]), Stage IIIB, grade I, MMR normal , 2 of 16 +lymph nodes, negative surgical margins---  06-02-2014  Right hemicolectomy w/ colostomy  . Allergy   . Anxiety   . CAD (coronary artery disease)    a. LHC 4/09: pLAD 40, mDx 70-75, pLCx 30, mLCx 50, mRCA 90, EF 60% >> PCI: BMS x2 to RCA;  b. Myoview 8/12: normal  . Cataract    bil cateracts removed  . Chronic anemia   . Chronic diastolic CHF (congestive heart failure) (HCC)    a. Echo 912 - Mild LVH, EF 55-60%, no RWMA, Gr 1 DD, mild MR, mild LAE, mild RAE, PASP 59 mmHg (mod to severe pulmo HTN)  . Colostomy in place [facility])    s/p colostomy takedown 5/16  . Colovaginal fistula    s/p colostomy >> colostomy takedown 5/16  . COPD with chronic bronchitis ([place])   . Depression   . Dysrhythmia    A fib  . Essential hypertension   . GERD (gastroesophageal reflux disease)   . History of adenomatous  polyp of colon   . History of blood transfusion   . History of DVT of lower extremity   . History of hiatal hernia   . History of TIA (transient ischemic attack)    a. Head CT 6/15: small chronic lacunar infarct in thalamus  . Hyperlipidemia   . OA (osteoarthritis)   . Peripheral neuropathy ([place])   . Pneumonia   . Pulmonary HTN ([place])    a. PASP on Echo in 9/12:  59 mmHg  . Renal insufficiency   . Sigmoid diverticulosis    s/p sigmoid colectomy  . Stroke ([place])    TIAs  . Wears dentures   . Wears glasses     Past Surgical History:  Procedure Laterality Date  .  ABDOMINAL HYSTERECTOMY    . ANTERIOR CERVICAL DECOMP/DISCECTOMY FUSION  01-31-2009   C5 -- 7  . APPENDECTOMY    . Bone spurs Bilateral    Feet  . CARDIOVASCULAR STRESS TEST  02-26-2011   Normal lexiscan no exercise study/  no ischemia/  normal LV function and wall motion, ef 67%  . CARPAL TUNNEL RELEASE Right   . CATARACT EXTRACTION W/ INTRAOCULAR LENS  IMPLANT, BILATERAL Bilateral   . CHOLECYSTECTOMY    . COLOSTOMY N/A 06/02/2014   Procedure: DIVERTING DESCENDING END COLOSTOMY;  Surgeon: Donnie Mesa, MD;  Location: [place];  Service: General;  Laterality: N/A;  . CORONARY ANGIOPLASTY WITH STENT PLACEMENT  11-04-2007  dr bensimhon   BMS x2 to RCA/  mild Non-obstructive disease LAD/  normal LVF  . DILATION AND CURETTAGE OF UTERUS    . EVALUATION UNDER ANESTHESIA WITH ANAL FISTULECTOMY N/A 10/13/2014   Procedure: ANAL EXAM UNDER ANESTHESIA ;  Surgeon: [name], MD;  Location: [facility];  Service: General;  Laterality: N/A;  . HAND SURGERY     Tendon repair  . LAPAROSCOPIC SIGMOID COLECTOMY N/A 11/24/2014   Procedure: SIGMOID COLECTOMY AND COLSOTOMY CLOSURE;  Surgeon: Donnie Mesa, MD;  Location: [place];  Service: General;  Laterality: N/A;  . LUMBAR LAMINECTOMY  10-29-2002,  1969   Left  L3 -- 4  decompression  . PARTIAL COLECTOMY N/A 06/02/2014   Procedure: RIGHT PARTIAL COLECTOMY ;  Surgeon: Donnie Mesa, MD;  Location: [place];  Service: General;  Laterality: N/A;  . PATELLECTOMY Right 09/14/2013   Procedure: PATELLECTOMY;  Surgeon: Meredith Pel, MD;  Location: [place];  Service: Orthopedics;  Laterality: Right;  . PROCTOSCOPY N/A 10/13/2014   Procedure: RIDGE PROCTOSCOPY;  Surgeon: [name], MD;  Location: [facility];  Service: General;  Laterality: N/A;  . REVISION TOTAL KNEE ARTHROPLASTY Bilateral right  04-02-2011/  left 1996 & 10-05-1999  . ROTATOR CUFF REPAIR Bilateral   . TONSILLECTOMY    . TOTAL KNEE ARTHROPLASTY Bilateral left 1994/  right 2000  . TOTAL KNEE REVISION Left  08/02/2015   Procedure: LEFT FEMORAL REVISION;  Surgeon: Gaynelle Arabian, MD;  Location: [facility];  Service: Orthopedics;  Laterality: Left;  . TRANSTHORACIC ECHOCARDIOGRAM  12-01-2009   Grade I diastolic dysfunction/  ef 60%/  moderate MR/  mild TR    Current Medications: Prior to Admission medications   Medication Sig Start Date End Date Taking? Authorizing Provider  acetaminophen (TYLENOL) 325 MG tablet Take 650 mg by mouth every 6 (six) hours as needed for moderate pain.     Historical Provider, MD  apixaban (ELIQUIS) 5 MG TABS tablet Take 1 tablet (5 mg total) by mouth 2 (two) times daily. 08/25/15   Nicki Reaper T  Weaver, PA-C  benazepril (LOTENSIN) 40 MG tablet TAKE 1 TABLET EVERY DAY 09/22/15   Marletta Lor, MD  esomeprazole (NEXIUM) 20 MG capsule Take 20 mg by mouth daily at 12 noon.    Historical Provider, MD  ferrous sulfate 325 (65 FE) MG tablet Take 1 tablet (325 mg total) by mouth 2 (two) times daily with a meal. 12/30/13   Erline Hau, MD  furosemide (LASIX) 20 MG tablet Take 20 mg daily today and tomorrow then decrease to 20 mg daily on Mondays, Wednesdays and Fridays 07/27/15   Liliane Shi, PA-C  gabapentin (NEURONTIN) 600 MG tablet TAKE 1 TABLET THREE TIMES DAILY 01/30/16   Marletta Lor, MD  HYDROcodone-acetaminophen (NORCO/VICODIN) 5-325 MG tablet Take 1 tablet by mouth every 6 (six) hours as needed for moderate pain. Patient not taking: Reported on 03/08/2016 01/17/16   Marletta Lor, MD  LORazepam (ATIVAN) 0.5 MG tablet TAKE ONE TABLET BY MOUTH TWICE DAILY AS NEEDED FOR ANXIETY OR SLEEP 01/17/16   Marletta Lor, MD  methocarbamol (ROBAXIN) 500 MG tablet Take 1 tablet (500 mg total) by mouth every 6 (six) hours as needed for muscle spasms. 08/04/15   Arlee Muslim, PA-C  metoprolol (LOPRESSOR) 100 MG tablet Take 1 tablet (100 mg total) by mouth 2 (two) times daily. 08/25/15   Liliane Shi, PA-C  nitroGLYCERIN (NITROSTAT) 0.4 MG SL tablet Place 0.4 mg under  the tongue every 5 (five) minutes as needed for chest pain (x 3 pills). Reported on 09/01/2015    Historical Provider, MD  Polyethyl Glycol-Propyl Glycol (SYSTANE OP) Place 1 drop into both eyes 2 (two) times daily as needed (for dry eyes).    Historical Provider, MD  promethazine (PHENERGAN) 12.5 MG tablet TAKE 1 TABLET (12.5 MG TOTAL) BY MOUTH EVERY 8 HOURS AS NEEDED FOR NAUSEA OR VOMITING. 11/28/15   Marletta Lor, MD  sertraline (ZOLOFT) 50 MG tablet TAKE 1 TABLET EVERY DAY 11/28/15   Marletta Lor, MD  traMADol (ULTRAM) 50 MG tablet Take 1 tablet (50 mg total) by mouth daily as needed. 11/02/15   Marletta Lor, MD  traZODone (DESYREL) 100 MG tablet TAKE 1/2 TABLET AT BEDTIME 11/28/15   Marletta Lor, MD  triamcinolone cream (KENALOG) 0.1 % Apply 1 application topically 2 (two) times daily as needed (Eczema on legs). Reported on 07/25/2015    Historical Provider, MD    Allergies:   Review of patient's allergies indicates no known allergies.   Social History   Social History  . Marital status: Married    Spouse name: N/A  . Number of children: N/A  . Years of education: N/A   Social History Main Topics  . Smoking status: Former Smoker    Packs/day: 0.50    Years: 15.00    Types: Cigarettes    Quit date: 07/16/1967  . Smokeless tobacco: Never Used  . Alcohol use No  . Drug use: No  . Sexual activity: Not Asked   Other Topics Concern  . None   Social History Narrative   Married, 2 children.  As of November 2015 her husband has been living in a nursing home for tendon after years as he suffers from multiple medical problems.   Patient does not drink alcohol nor does she smoke or chew tobacco products   Right handed   8th grade   1 cup daily     Family History:  The patient's family history includes Cancer (age  of onset: 24) in her sister; Colon cancer (age of onset: 79) in her maternal uncle; Heart Problems in her father; Heart failure in her mother; Kidney  cancer (age of onset: 49) in her brother; Osteoarthritis in her mother.   ROS:   Please see the history of present illness.    ROS All other systems reviewed and are negative.   PHYSICAL EXAM:   VS:  BP 140/80   Pulse 98   Ht 5' 3" (1.6 m)   Wt 184 lb (83.5 kg)   BMI 32.59 kg/m    GEN: Well nourished, well developed, in no acute distress  HEENT: normal  Neck: no JVD, carotid bruits, or masses Cardiac: irregular irregular; no murmurs, rubs, or gallops,no edema  Respiratory:  clear to auscultation bilaterally, normal work of breathing GI: soft, nontender, nondistended, + BS MS: no deformity or atrophy  Skin: warm and dry, no rash Neuro:  Alert and Oriented x 3, Strength and sensation are intact Psych: euthymic mood, full affect  Wt Readings from Last 3 Encounters:  03/26/16 184 lb (83.5 kg)  03/08/16 183 lb 1.6 oz (83.1 kg)  01/17/16 184 lb (83.5 kg)      Studies/Labs Reviewed:   EKG:  EKG is ordered today.  The ekg ordered today demonstrates afib at rate of 84 bpm, non specific ST changes in inferior leads.   Recent Labs: 07/25/2015: Brain Natriuretic Peptide 443.2 03/01/2016: ALT 19; BUN 32.5; Creatinine 1.3; HGB 13.5; Platelets 123; Potassium 4.7; Sodium 140   Lipid Panel    Component Value Date/Time   CHOL 130 01/19/2013 1014   TRIG 87.0 01/19/2013 1014   HDL 33.60 (L) 01/19/2013 1014   CHOLHDL 4 01/19/2013 1014   VLDL 17.4 01/19/2013 1014   LDLCALC 79 01/19/2013 1014    Additional studies/ records that were reviewed today include:   Echo 07/27/15 Mild LVH, EF 50-55%, no RWMA, mod MR, severe LAE, mild RAE, PASP 44 mmHg  Echo 9/12 Mild LVH, EF 55-60%, no RWMA, Gr 1 DD, mild MR, mild LAE, mild RAE, PASP 59 mmHg (mod to severe pulmo HTN)  Myoview 8/12 Normal stress nuclear study  LHC 4/09 LM ok LAD calcified, prox and mid 40%, Dx mid 70-75% LCx prox 30%, mid AV groove 50% RCA mid 90% EF 60% PCI: Vision BMS x 2 to mRCA    ASSESSMENT & PLAN:     1. Persistent Atrial Fib:   - She remains in AFib. Seems she is intermittently going fast. Discussed monitor placement however she wants to defer now due to cost however willing to discuss in future.  Will add cardizem 62m q 8 hours. Unable to add CD due to cost. Continue lopressor 100 mg BID.  CHADS2-VASc=7. She needs to remain on long term anticoagulation. She is in donut hole. Eliquis 595mBID sample given. She been on Warfarin in past and she did not like to go back. Advised to call insurance company and find out options (Xarelto) and co-pays. She will [place].  -  Last echo 07/2015 with normal LVEF (50-55%) and normal wall motion.   2. Chest pain  - Concerning. She does have hx of CAD s/p BMS to RCA x 2 in 2009. Last myoview 02/2011 was normal. Responsive to SL nitro. Her symptoms could be due to uncontrolled afib. Medications as above. Discussed Myoview --> deferred until next OV.  Continue beta blocker. No ASA as she is on Eliquis.  She will give [place]all or  go to ER if worsening of symptoms. CT of chest showed stable coronary calcifications.   3. Chronic Diastolic CHF:  - Volume stable. Continue current Rx.   4. HTN:  - Advised to keep long. Medications as above.     Medication Adjustments/Labs and Tests Ordered: Current medicines are reviewed at length with the patient today.  Concerns regarding medicines are outlined above.  Medication changes, Labs and Tests ordered today are listed in the Patient Instructions below. Patient Instructions  Medication Instructions: Your physician has recommended you make the following change in your medication:   1. Start CARDIZEM 1 tablet (30 mg) by mouth every 8 hours - three times daily --- A new prescription for your Nitroglycerin has been sent in    Labwork: None Ordered  Procedures/Testing: None Ordered  Follow-Up: Your physician recommends that you schedule a follow-up appointment in 3-4 weeks with an APP/Extender  on a day that Dr. Angelena Form is in clinic   Any Additional Special Instructions Will Be Listed Below (If Applicable).     If you need a refill on your cardiac medications before your next appointment, please call your pharmacy.      Jarrett Soho, [place]  03/26/2016 11:21 AM    [facility] [place], [place],   [address] Phone: [phone]; Fax: [phone]

## 2016-03-26 NOTE — Telephone Encounter (Signed)
Continue Eliquis sample until runs out. Take morning dose of Eliquis (last pill) as schedule and start Xarelto 15mg  qd with dinner same day. Then continue 15mg  every night.   Spoke with our Pharmacist, Jinny Blossom. Will start low dose of Xaralto due to CrCl of 42.

## 2016-03-26 NOTE — Telephone Encounter (Signed)
[name] is calling back because she ws told to call back and give the prices of the Xarelto vs Eliquis . The Xarelto has a $131.00 copay and the Eliquis has a $466.19 copay and she is wanting to switch to Xarelto . Please call

## 2016-03-28 MED ORDER — RIVAROXABAN 15 MG PO TABS: 15.0000 mg | ORAL_TABLET | Freq: Every day | ORAL | 3 refills | Status: DC

## 2016-03-28 NOTE — Telephone Encounter (Signed)
S/w pt in regards to the cost of the Xarelto vs Eliquis. The cost of Xarelto will be much cheaper for her. Pt advised per Robbie Lis, PA ok to switch over Xarelto. Pt advised per PA to finish Eliquis samples before starting the Xarelto. She has been advised on her last day of Eliquis she will take the Eliquis in the morning and she will start Xarelto 15 mg later that day at supper time , and then pt advised she will continue on Xarelto 15 mg daily. Pt asked for me to send Rx Xarelto to [facility]. Pt verbalized understanding to plan of care for the change in the medications. Pt advised if any questions feel free to call the office. Pt said thank you for our help.

## 2016-04-04 ENCOUNTER — Other Ambulatory Visit: Payer: Self-pay | Admitting: Internal Medicine

## 2016-04-22 ENCOUNTER — Other Ambulatory Visit: Payer: Self-pay | Admitting: Internal Medicine

## 2016-04-23 ENCOUNTER — Ambulatory Visit (INDEPENDENT_AMBULATORY_CARE_PROVIDER_SITE_OTHER): Payer: Commercial Managed Care - HMO | Admitting: Physician Assistant

## 2016-04-23 ENCOUNTER — Encounter: Payer: Self-pay | Admitting: Physician Assistant

## 2016-04-23 VITALS — BP 136/82 | HR 81 | Ht 63.0 in | Wt 188.0 lb

## 2016-04-23 DIAGNOSIS — I4819 Other persistent atrial fibrillation: Secondary | ICD-10-CM

## 2016-04-23 DIAGNOSIS — R0789 Other chest pain: Secondary | ICD-10-CM

## 2016-04-23 DIAGNOSIS — I481 Persistent atrial fibrillation: Secondary | ICD-10-CM | POA: Diagnosis not present

## 2016-04-23 DIAGNOSIS — I5032 Chronic diastolic (congestive) heart failure: Secondary | ICD-10-CM

## 2016-04-23 DIAGNOSIS — I25118 Atherosclerotic heart disease of native coronary artery with other forms of angina pectoris: Secondary | ICD-10-CM | POA: Diagnosis not present

## 2016-04-23 DIAGNOSIS — I10 Essential (primary) hypertension: Secondary | ICD-10-CM | POA: Diagnosis not present

## 2016-04-23 MED ORDER — ISOSORBIDE MONONITRATE ER 30 MG PO TB24: 30.0000 mg | ORAL_TABLET | Freq: Every day | ORAL | 1 refills | Status: DC

## 2016-04-23 NOTE — Progress Notes (Signed)
Cardiology Office Note    Date:  04/23/2016   ID:  Kaycie Pegues Atlas, DOB [birth year], MRN [id number]  PCP:  Nyoka Cowden, MD  Cardiologist: Dr. Angelena Form  Chief Complaint  Patient presents with  . Follow-up    History of Present Illness:  [name] is a 82 y.o. female  with history of CAD s/p BMS to RCA x 2 in 0240, diastolic CHF, HTN, HLD, persistent afib on Eliquis, chronic dyspnea on exertion, COPD, pulmonary HTN, ? prior TIA, prior DVT, colon CA who presented for evaluation of chest pain.    Colon cancer diagnosed in 11/15. She underwent right hemicolectomy with diverting colostomy. She saw Dr. Domenic Polite in 5/16 and was cleared from a cardiac perspective for colostomy takedown and sigmoid colectomy for diverticulosis.  She was seen by Lyda Jester, PA-C 12/16 for surgical clearance (for L TKR).  ECG was noted to demonstrate new onset AFib with controlled VR.  She underwent L femoral revision of TKR 08/02/15 with Dr. Wynelle Link.  Eliquis started after knee replacement. She has been on metoprolol for rate control.    She was doing well overall on cardiac perspective when last seen by Dr. Angelena Form 12/19/15. She has chronic DOE.    The patient was seen by oncology (Dr. Truitt Merle 03/08/16). Her surveillance CT scan from 03/06/2016 showed no evidence of recurrence. She was advised to followed up with cardiology due to continuous intermittent left chest pain and CT of Chest 02/2016 showed stable atherosclerotic calcifications in aorta, abdominal aorta and 3 vessels coronary artery.   She saw Vin Bhagat 03/26/16.She was still in atrial fibrillation with intermittent fast rates. He wanted to place monitor however she deferred due to cost. He added Cardizem 30 mg every 8 hours. Could not add Cardizem CD due to cost. Continued Lopressor 100 mg twice a day.CHADSVASC=7 She is in the donut hole and can't afford Eliquis. Samples were given. Myoview recommended for NTG responsive chest pain, but she  deferred because of cost. CT of chest showed stable coronary calcifications.  Patient comes in today accompanied by her daughter. She still is having passed heart rates whenever she does something. 2 weeks ago she awakened from sleep with fast heart rates and chest tightness and shortness of breath relieved with 2 or 3 nitroglycerin. Her chest pain always seems to be accompanied by faster heart rates. She says it Valeri be a little better the addition of Cardizem but she still has fast heart rates if she goes up and down her stairs or walks down her driveway to get the mail. Because she is in the donut hole Xarelto and Eliquis will cost her $400 a month which she cannot afford.    Past Medical History:  Diagnosis Date  . Adenocarcinoma, colon [facility]) Oncologist-- Dr Truitt Merle   Multifocal (2) Colon cancer @ ileocecal valve and ascending ( [id number]), Stage IIIB, grade I, MMR normal , 2 of 16 +lymph nodes, negative surgical margins---  06-02-2014  Right hemicolectomy w/ colostomy  . Allergy   . Anxiety   . CAD (coronary artery disease)    a. LHC 4/09: pLAD 40, mDx 70-75, pLCx 30, mLCx 50, mRCA 90, EF 60% >> PCI: BMS x2 to RCA;  b. Myoview 8/12: normal  . Cataract    bil cateracts removed  . Chronic anemia   . Chronic diastolic CHF (congestive heart failure) (HCC)    a. Echo 912 - Mild LVH, EF 55-60%, no RWMA, Gr 1 DD, mild MR,  mild LAE, mild RAE, PASP 59 mmHg (mod to severe pulmo HTN)  . Colostomy in place [facility])    s/p colostomy takedown 5/16  . Colovaginal fistula    s/p colostomy >> colostomy takedown 5/16  . COPD with chronic bronchitis ([place])   . Depression   . Dysrhythmia    A fib  . Essential hypertension   . GERD (gastroesophageal reflux disease)   . History of adenomatous polyp of colon   . History of blood transfusion   . History of DVT of lower extremity   . History of hiatal hernia   . History of TIA (transient ischemic attack)    a. Head CT 6/15: small chronic lacunar infarct in  thalamus  . Hyperlipidemia   . OA (osteoarthritis)   . Peripheral neuropathy ([place])   . Pneumonia   . Pulmonary HTN    a. PASP on Echo in 9/12:  59 mmHg  . Renal insufficiency   . Sigmoid diverticulosis    s/p sigmoid colectomy  . Stroke ([place])    TIAs  . Wears dentures   . Wears glasses     Past Surgical History:  Procedure Laterality Date  . ABDOMINAL HYSTERECTOMY    . ANTERIOR CERVICAL DECOMP/DISCECTOMY FUSION  01-31-2009   C5 -- 7  . APPENDECTOMY    . Bone spurs Bilateral    Feet  . CARDIOVASCULAR STRESS TEST  02-26-2011   Normal lexiscan no exercise study/  no ischemia/  normal LV function and wall motion, ef 67%  . CARPAL TUNNEL RELEASE Right   . CATARACT EXTRACTION W/ INTRAOCULAR LENS  IMPLANT, BILATERAL Bilateral   . CHOLECYSTECTOMY    . COLOSTOMY N/A 06/02/2014   Procedure: DIVERTING DESCENDING END COLOSTOMY;  Surgeon: Donnie Mesa, MD;  Location: [place];  Service: General;  Laterality: N/A;  . CORONARY ANGIOPLASTY WITH STENT PLACEMENT  11-04-2007  dr bensimhon   BMS x2 to RCA/  mild Non-obstructive disease LAD/  normal LVF  . DILATION AND CURETTAGE OF UTERUS    . EVALUATION UNDER ANESTHESIA WITH ANAL FISTULECTOMY N/A 10/13/2014   Procedure: ANAL EXAM UNDER ANESTHESIA ;  Surgeon: [name], MD;  Location: [facility];  Service: General;  Laterality: N/A;  . HAND SURGERY     Tendon repair  . LAPAROSCOPIC SIGMOID COLECTOMY N/A 11/24/2014   Procedure: SIGMOID COLECTOMY AND COLSOTOMY CLOSURE;  Surgeon: Donnie Mesa, MD;  Location: [place];  Service: General;  Laterality: N/A;  . LUMBAR LAMINECTOMY  10-29-2002,  1969   Left  L3 -- 4  decompression  . PARTIAL COLECTOMY N/A 06/02/2014   Procedure: RIGHT PARTIAL COLECTOMY ;  Surgeon: Donnie Mesa, MD;  Location: [place];  Service: General;  Laterality: N/A;  . PATELLECTOMY Right 09/14/2013   Procedure: PATELLECTOMY;  Surgeon: Meredith Pel, MD;  Location: [place];  Service: Orthopedics;  Laterality: Right;  . PROCTOSCOPY N/A  10/13/2014   Procedure: RIDGE PROCTOSCOPY;  Surgeon: [name], MD;  Location: [facility];  Service: General;  Laterality: N/A;  . REVISION TOTAL KNEE ARTHROPLASTY Bilateral right  04-02-2011/  left 1996 & 10-05-1999  . ROTATOR CUFF REPAIR Bilateral   . TONSILLECTOMY    . TOTAL KNEE ARTHROPLASTY Bilateral left 1994/  right 2000  . TOTAL KNEE REVISION Left 08/02/2015   Procedure: LEFT FEMORAL REVISION;  Surgeon: Gaynelle Arabian, MD;  Location: [facility];  Service: Orthopedics;  Laterality: Left;  . TRANSTHORACIC ECHOCARDIOGRAM  12-01-2009   Grade I diastolic dysfunction/  ef 60%/  moderate MR/  mild TR    Current Medications: Outpatient Medications Prior to Visit  Medication Sig Dispense Refill  . acetaminophen (TYLENOL) 325 MG tablet Take 650 mg by mouth every 6 (six) hours as needed for moderate pain.     . benazepril (LOTENSIN) 40 MG tablet TAKE 1 TABLET EVERY DAY 90 tablet 1  . diltiazem (CARDIZEM) 30 MG tablet Take 1 tablet (30 mg total) by mouth every 8 (eight) hours. 90 tablet 6  . esomeprazole (NEXIUM) 20 MG capsule Take 20 mg by mouth daily at 12 noon.    . ferrous sulfate 325 (65 FE) MG tablet Take 1 tablet (325 mg total) by mouth 2 (two) times daily with a meal.  3  . furosemide (LASIX) 20 MG tablet Take 20 mg daily today and tomorrow then decrease to 20 mg daily on Mondays, Wednesdays and Fridays 20 tablet 3  . gabapentin (NEURONTIN) 600 MG tablet TAKE 1 TABLET THREE TIMES DAILY 270 tablet 3  . HYDROcodone-acetaminophen (NORCO/VICODIN) 5-325 MG tablet Take 1 tablet by mouth every 6 (six) hours as needed for moderate pain. 60 tablet 0  . LORazepam (ATIVAN) 0.5 MG tablet TAKE ONE TABLET BY MOUTH TWICE DAILY AS NEEDED FOR ANXIETY OR SLEEP 60 tablet 5  . methocarbamol (ROBAXIN) 500 MG tablet Take 1 tablet (500 mg total) by mouth every 6 (six) hours as needed for muscle spasms. 80 tablet 0  . metoprolol (LOPRESSOR) 100 MG tablet Take 1 tablet (100 mg total) by mouth 2 (two) times daily. 180  tablet 3  . nitroGLYCERIN (NITROSTAT) 0.4 MG SL tablet Place 1 tablet (0.4 mg total) under the tongue every 5 (five) minutes as needed for chest pain (x 3 pills). Reported on 09/01/2015 25 tablet 3  . nitroGLYCERIN (NITROSTAT) 0.4 MG SL tablet Place 1 tablet (0.4 mg total) under the tongue every 5 (five) minutes as needed for chest pain (x 3 pills). Reported on 09/01/2015 25 tablet 3  . Polyethyl Glycol-Propyl Glycol (SYSTANE OP) Place 1 drop into both eyes 2 (two) times daily as needed (for dry eyes).    . promethazine (PHENERGAN) 12.5 MG tablet TAKE 1 TABLET (12.5 MG TOTAL) BY MOUTH EVERY 8 HOURS AS NEEDED FOR NAUSEA OR VOMITING. 20 tablet 1  . sertraline (ZOLOFT) 50 MG tablet TAKE 1 TABLET EVERY DAY 90 tablet 1  . traMADol (ULTRAM) 50 MG tablet Take 1 tablet (50 mg total) by mouth daily as needed. 60 tablet 2  . traZODone (DESYREL) 100 MG tablet TAKE 1/2 TABLET AT BEDTIME 45 tablet 1  . triamcinolone cream (KENALOG) 0.1 % Apply 1 application topically 2 (two) times daily as needed (Eczema on legs). Reported on 07/25/2015    . Rivaroxaban (XARELTO) 15 MG TABS tablet Take 1 tablet (15 mg total) by mouth daily with supper. (Patient not taking: Reported on 04/23/2016) 90 tablet 3   No facility-administered medications prior to visit.      Allergies:   Review of patient's allergies indicates no known allergies.   Social History   Social History  . Marital status: Married    Spouse name: N/A  . Number of children: N/A  . Years of education: N/A   Social History Main Topics  . Smoking status: Former Smoker    Packs/day: 0.50    Years: 15.00    Types: Cigarettes    Quit date: 07/16/1967  . Smokeless tobacco: Never Used  . Alcohol use No  . Drug use: No  . Sexual activity: Not Asked  Other Topics Concern  . None   Social History Narrative   Married, 2 children.  As of November 2015 her husband has been living in a nursing home for tendon after years as he suffers from multiple medical  problems.   Patient does not drink alcohol nor does she smoke or chew tobacco products   Right handed   8th grade   1 cup daily     Family History:  The patient's family history includes Cancer (age of onset: 28) in her sister; Colon cancer (age of onset: 37) in her maternal uncle; Heart Problems in her father; Heart failure in her mother; Kidney cancer (age of onset: 23) in her brother; Osteoarthritis in her mother.   ROS:   Please see the history of present illness.    Review of Systems  Constitution: Positive for weakness and malaise/fatigue.  HENT: Negative.   Eyes: Negative.   Cardiovascular: Positive for chest pain, dyspnea on exertion, irregular heartbeat and palpitations.  Respiratory: Positive for shortness of breath.   Hematologic/Lymphatic: Negative.   Musculoskeletal: Positive for joint pain, muscle weakness, myalgias and stiffness.  Gastrointestinal: Negative.   Genitourinary: Negative.    All other systems reviewed and are negative.   PHYSICAL EXAM:   VS:  BP 136/82   Pulse 81   Ht '5\' 3"'$  (1.6 m)   Wt 188 lb (85.3 kg)   SpO2 94%   BMI 33.30 kg/m   Physical Exam  GEN: Well nourished, well developed, in no acute distress  Neck: no JVD, carotid bruits, or masses Cardiac: Irregular irregular with 1/6 systolic murmur at the left sternal border, no rubs, or gallops  Respiratory:  clear to auscultation bilaterally, normal work of breathing GI: soft, nontender, nondistended, + BS Ext: without cyanosis, clubbing, or edema, Good distal pulses bilaterally MS: no deformity or atrophy  Skin: warm and dry, no rash Psych: euthymic mood, full affect  Wt Readings from Last 3 Encounters:  04/23/16 188 lb (85.3 kg)  03/26/16 184 lb (83.5 kg)  03/08/16 183 lb 1.6 oz (83.1 kg)      Studies/Labs Reviewed:   EKG:  EKG isNot ordered today.   Recent Labs: 07/25/2015: Brain Natriuretic Peptide 443.2 03/01/2016: ALT 19; BUN 32.5; Creatinine 1.3; HGB 13.5; Platelets 123;  Potassium 4.7; Sodium 140   Lipid Panel    Component Value Date/Time   CHOL 130 01/19/2013 1014   TRIG 87.0 01/19/2013 1014   HDL 33.60 (L) 01/19/2013 1014   CHOLHDL 4 01/19/2013 1014   VLDL 17.4 01/19/2013 1014   LDLCALC 79 01/19/2013 1014    Additional studies/ records that were reviewed today include:  CT 03/06/16: IMPRESSION: 1. Stable remote surgical changes from a partial right hemicolectomy. No CT findings for recurrent tumor or metastatic disease. 2. Stable atherosclerotic calcifications involving the thoracic aorta, abdominal aorta and branch vessels including dense 3 vessel coronary artery calcifications. 3. Moderate to large hiatal hernia. 4. Scoliosis and degenerative changes involving the spine. 5. No acute findings in the chest, abdomen or pelvis.     Electronically Signed   By: Marijo Sanes M.D.   On: 03/06/2016 09:42  2Decho 07/27/15:  Study Conclusions   - Left ventricle: The cavity size was normal. Wall thickness was   increased in a pattern of mild LVH. Systolic function was normal.   The estimated ejection fraction was in the range of 50% to 55%.   Wall motion was normal; there were no regional wall motion   abnormalities. -  Mitral valve: There was moderate regurgitation. - Left atrium: The atrium was severely dilated. - Right atrium: The atrium was mildly dilated. - Pulmonary arteries: Systolic pressure was mildly increased. PA   peak pressure: 44 mm Hg (S).   Impressions:   - Low normal LV systolic function; severe LAE; mild RAE; moderate   MR; mild TR; mildly elevated pulmonary pressure.       ASSESSMENT:    1. Persistent atrial fibrillation (HCC)   2. Other chest pain   3. Coronary artery disease of native artery of native heart with stable angina pectoris ([place])   4. Essential hypertension   5. DIASTOLIC HEART FAILURE, CHRONIC      PLAN:  In order of problems listed above:  Persistent atrial fibrillation: Still having fast  heart rates despite addition of Cardizem 30 mg every 8 hours. Heart rate controlled here today. I told her she could take an additional 30 mg for rapid heart rates. We will place a 48 hour monitor to document her heart rates. Have given her month supply of Eliquis and instructions on how to apply for patient assistance to get her through to the end of the year.  Chest pain nitroglycerin responsive and usually in the setting of fast heart rates. We'll try to control heart rate. Add Imdur 30 mg once daily. Discussed nuclear stress test once again with patient but she like to hold off until after the monitor. She is advised to go to the emergency room for prolonged chest pain.  Essential hypertension controlled  Diastolic heart failure compensated    Medication Adjustments/Labs and Tests Ordered: Current medicines are reviewed at length with the patient today.  Concerns regarding medicines are outlined above.  Medication changes, Labs and Tests ordered today are listed in the Patient Instructions below. There are no Patient Instructions on file for this visit.   Signed, Ermalinda Barrios, PA-C  04/23/2016 10:03 AM    [facility] [address], [place], [place]  [address] Phone: [phone]; Fax: [phone]

## 2016-04-23 NOTE — Patient Instructions (Addendum)
Medication Instructions:  Start Imdur (isosorbide mononitrate) 30mg  daily  Labwork: None   Testing/Procedures: Your physician has recommended that you wear a holter monitor. Holter monitors are medical devices that record the heart's electrical activity. Doctors most often use these monitors to diagnose arrhythmias. Arrhythmias are problems with the speed or rhythm of the heartbeat. The monitor is a small, portable device. You can wear one while you do your normal daily activities. This is usually used to diagnose what is causing palpitations/syncope (passing out).  [place]: Your physician recommends that you schedule a follow-up appointment in: 1 month with Lucita Ferrara    You have been given patient assistance forms for Eliquis -[employer] Please complete those forms and return them to [employer] in our office.    If you need a refill on your cardiac medications before your next appointment, please call your pharmacy.

## 2016-05-01 ENCOUNTER — Ambulatory Visit (INDEPENDENT_AMBULATORY_CARE_PROVIDER_SITE_OTHER): Payer: Commercial Managed Care - HMO

## 2016-05-01 DIAGNOSIS — I25118 Atherosclerotic heart disease of native coronary artery with other forms of angina pectoris: Secondary | ICD-10-CM | POA: Diagnosis not present

## 2016-05-01 DIAGNOSIS — I10 Essential (primary) hypertension: Secondary | ICD-10-CM

## 2016-05-01 DIAGNOSIS — I5032 Chronic diastolic (congestive) heart failure: Secondary | ICD-10-CM

## 2016-05-01 DIAGNOSIS — I4819 Other persistent atrial fibrillation: Secondary | ICD-10-CM

## 2016-05-01 DIAGNOSIS — R0789 Other chest pain: Secondary | ICD-10-CM | POA: Diagnosis not present

## 2016-05-01 DIAGNOSIS — I481 Persistent atrial fibrillation: Secondary | ICD-10-CM | POA: Diagnosis not present

## 2016-05-02 ENCOUNTER — Other Ambulatory Visit: Payer: Self-pay

## 2016-05-02 MED ORDER — APIXABAN 5 MG PO TABS: 5.0000 mg | ORAL_TABLET | Freq: Two times a day (BID) | ORAL | 3 refills | Status: DC

## 2016-05-06 ENCOUNTER — Telehealth: Payer: Self-pay

## 2016-05-06 NOTE — Telephone Encounter (Signed)
Application for Eliquis through [employer] faxed today.

## 2016-05-10 ENCOUNTER — Telehealth: Payer: Self-pay

## 2016-05-10 ENCOUNTER — Telehealth: Payer: Self-pay | Admitting: Cardiovascular Disease

## 2016-05-10 NOTE — Telephone Encounter (Signed)
I spoke with pt and reviewed monitor results with her.  

## 2016-05-10 NOTE — Telephone Encounter (Signed)
Patient is returning your call.  

## 2016-05-10 NOTE — Telephone Encounter (Signed)
Patient has been approved for Eliquis by [employer]. She will receive a letter to this affect.

## 2016-05-14 ENCOUNTER — Encounter: Payer: Self-pay | Admitting: Physician Assistant

## 2016-05-22 ENCOUNTER — Ambulatory Visit (INDEPENDENT_AMBULATORY_CARE_PROVIDER_SITE_OTHER): Payer: Commercial Managed Care - HMO

## 2016-05-22 DIAGNOSIS — Z23 Encounter for immunization: Secondary | ICD-10-CM | POA: Diagnosis not present

## 2016-05-27 ENCOUNTER — Encounter (INDEPENDENT_AMBULATORY_CARE_PROVIDER_SITE_OTHER): Payer: Self-pay

## 2016-05-27 ENCOUNTER — Encounter: Payer: Self-pay | Admitting: Physician Assistant

## 2016-05-27 ENCOUNTER — Ambulatory Visit (INDEPENDENT_AMBULATORY_CARE_PROVIDER_SITE_OTHER): Payer: Commercial Managed Care - HMO | Admitting: Physician Assistant

## 2016-05-27 VITALS — BP 134/90 | HR 69 | Ht 63.0 in | Wt 191.0 lb

## 2016-05-27 DIAGNOSIS — I481 Persistent atrial fibrillation: Secondary | ICD-10-CM

## 2016-05-27 DIAGNOSIS — R0789 Other chest pain: Secondary | ICD-10-CM

## 2016-05-27 DIAGNOSIS — I10 Essential (primary) hypertension: Secondary | ICD-10-CM

## 2016-05-27 DIAGNOSIS — I5032 Chronic diastolic (congestive) heart failure: Secondary | ICD-10-CM | POA: Diagnosis not present

## 2016-05-27 DIAGNOSIS — I4819 Other persistent atrial fibrillation: Secondary | ICD-10-CM

## 2016-05-27 NOTE — Patient Instructions (Signed)
Medication Instructions:  Your physician recommends that you continue on your current medications as directed. Please refer to the Current Medication list given to you today.   Labwork: -None  Testing/Procedures: -None  Follow-Up: Your physician recommends that you keep your scheduled  follow-up appointment with Dr. [name].   Any Other Special Instructions Will Be Listed Below (If Applicable).     If you need a refill on your cardiac medications before your next appointment, please call your pharmacy.   

## 2016-05-27 NOTE — Progress Notes (Signed)
Cardiology Office Note    Date:  05/27/2016   ID:  [name], DOB October 15, 1929, MRN [id number]  PCP:  Nyoka Cowden, MD  Cardiologist: Dr. Angelena Form  Chief Complaint  Patient presents with  . Follow-up    History of Present Illness:  [name] is a 82 y.o. female with history of CAD s/p BMS to RCA x 2 in 3151, diastolic CHF, HTN, HLD, persistent afib on Eliquis, chronic dyspnea on exertion, COPD, pulmonary HTN, ? prior TIA, prior DVT, colon CA who presented for evaluation of chest pain.    Colon cancer diagnosed in 11/15. She underwent right hemicolectomy with diverting colostomy. She saw Dr. Domenic Polite in 5/16 and was cleared from a cardiac perspective for colostomy takedown and sigmoid colectomy for diverticulosis.  She was seen by Lyda Jester, PA-C 12/16 for surgical clearance (for L TKR).  ECG was noted to demonstrate new onset AFib with controlled VR.  She underwent L femoral revision of TKR 08/02/15 with Dr. Wynelle Link.  Eliquis started after knee replacement. She has been on metoprolol for rate control.   The patient was seen by oncology (Dr. Truitt Merle 03/08/16). Her surveillance CT scan from 03/06/2016 showed no evidence of recurrence. She was advised to followed up with cardiology due to continuous intermittent left chest pain and CT of Chest 02/2016 showed stable atherosclerotic calcifications in aorta, abdominal aorta and 3 vessels coronary artery.    She saw Vin Bhagat 03/26/16.She was still in atrial fibrillation with intermittent fast rates. He wanted to place monitor however she deferred due to cost. He added Cardizem 30 mg every 8 hours. Could not add Cardizem CD due to cost. Continued Lopressor 100 mg twice a day.CHADSVASC=7 She is in the donut hole and can't afford Eliquis. Samples were given and she qualified for assistance. Myoview recommended for NTG responsive chest pain, but she deferred because of cost. CT of chest showed stable coronary calcifications.  I  saw her 04/23/16 still complaining of fast heart rates associated with chest pain. I placed a monitor, told her to take extra Cardizem for rapid heart rates and added Imdur.  Holter monitor showed Atrial fibrillation with controlled rates, highest rate 128/m, PVC's, no long pauses.  Patient comes in today accompanied by her daughter. She has severe headache from the Imdur to put her in bed all day long. She has good days and bad days. She continues to of some fast heart rates associated with chest tightness. She doesn't always take the extra diltiazem like I told her until after her heart is racing for a while. She still would like to hold off on any further testing until she is out of the donut hole. She is getting new insurance in January.    Past Medical History:  Diagnosis Date  . Adenocarcinoma, colon [facility]) Oncologist-- Dr Truitt Merle   Multifocal (2) Colon cancer @ ileocecal valve and ascending ( [id number]), Stage IIIB, grade I, MMR normal , 2 of 16 +lymph nodes, negative surgical margins---  06-02-2014  Right hemicolectomy w/ colostomy  . Allergy   . Anxiety   . CAD (coronary artery disease)    a. LHC 4/09: pLAD 40, mDx 70-75, pLCx 30, mLCx 50, mRCA 90, EF 60% >> PCI: BMS x2 to RCA;  b. Myoview 8/12: normal  . Cataract    bil cateracts removed  . Chronic anemia   . Chronic diastolic CHF (congestive heart failure) (HCC)    a. Echo 912 - Mild LVH, EF 55-60%,  no RWMA, Gr 1 DD, mild MR, mild LAE, mild RAE, PASP 59 mmHg (mod to severe pulmo HTN)  . Colostomy in place [facility])    s/p colostomy takedown 5/16  . Colovaginal fistula    s/p colostomy >> colostomy takedown 5/16  . COPD with chronic bronchitis ([place])   . Depression   . Dysrhythmia    A fib  . Essential hypertension   . GERD (gastroesophageal reflux disease)   . History of adenomatous polyp of colon   . History of blood transfusion   . History of DVT of lower extremity   . History of hiatal hernia   . History of TIA (transient  ischemic attack)    a. Head CT 6/15: small chronic lacunar infarct in thalamus  . Hyperlipidemia   . OA (osteoarthritis)   . Peripheral neuropathy ([place])   . Pneumonia   . Pulmonary HTN    a. PASP on Echo in 9/12:  59 mmHg  . Renal insufficiency   . Sigmoid diverticulosis    s/p sigmoid colectomy  . Stroke ([place])    TIAs  . Wears dentures   . Wears glasses     Past Surgical History:  Procedure Laterality Date  . ABDOMINAL HYSTERECTOMY    . ANTERIOR CERVICAL DECOMP/DISCECTOMY FUSION  01-31-2009   C5 -- 7  . APPENDECTOMY    . Bone spurs Bilateral    Feet  . CARDIOVASCULAR STRESS TEST  02-26-2011   Normal lexiscan no exercise study/  no ischemia/  normal LV function and wall motion, ef 67%  . CARPAL TUNNEL RELEASE Right   . CATARACT EXTRACTION W/ INTRAOCULAR LENS  IMPLANT, BILATERAL Bilateral   . CHOLECYSTECTOMY    . COLOSTOMY N/A 06/02/2014   Procedure: DIVERTING DESCENDING END COLOSTOMY;  Surgeon: Donnie Mesa, MD;  Location: [place];  Service: General;  Laterality: N/A;  . CORONARY ANGIOPLASTY WITH STENT PLACEMENT  11-04-2007  dr bensimhon   BMS x2 to RCA/  mild Non-obstructive disease LAD/  normal LVF  . DILATION AND CURETTAGE OF UTERUS    . EVALUATION UNDER ANESTHESIA WITH ANAL FISTULECTOMY N/A 10/13/2014   Procedure: ANAL EXAM UNDER ANESTHESIA ;  Surgeon: [name], MD;  Location: [facility];  Service: General;  Laterality: N/A;  . HAND SURGERY     Tendon repair  . LAPAROSCOPIC SIGMOID COLECTOMY N/A 11/24/2014   Procedure: SIGMOID COLECTOMY AND COLSOTOMY CLOSURE;  Surgeon: Donnie Mesa, MD;  Location: [place];  Service: General;  Laterality: N/A;  . LUMBAR LAMINECTOMY  10-29-2002,  1969   Left  L3 -- 4  decompression  . PARTIAL COLECTOMY N/A 06/02/2014   Procedure: RIGHT PARTIAL COLECTOMY ;  Surgeon: Donnie Mesa, MD;  Location: [place];  Service: General;  Laterality: N/A;  . PATELLECTOMY Right 09/14/2013   Procedure: PATELLECTOMY;  Surgeon: Meredith Pel, MD;  Location:  [place];  Service: Orthopedics;  Laterality: Right;  . PROCTOSCOPY N/A 10/13/2014   Procedure: RIDGE PROCTOSCOPY;  Surgeon: [name], MD;  Location: [facility];  Service: General;  Laterality: N/A;  . REVISION TOTAL KNEE ARTHROPLASTY Bilateral right  04-02-2011/  left 1996 & 10-05-1999  . ROTATOR CUFF REPAIR Bilateral   . TONSILLECTOMY    . TOTAL KNEE ARTHROPLASTY Bilateral left 1994/  right 2000  . TOTAL KNEE REVISION Left 08/02/2015   Procedure: LEFT FEMORAL REVISION;  Surgeon: Gaynelle Arabian, MD;  Location: [facility];  Service: Orthopedics;  Laterality: Left;  . TRANSTHORACIC ECHOCARDIOGRAM  12-01-2009   Grade I diastolic  dysfunction/  ef 60%/  moderate MR/  mild TR    Current Medications: Outpatient Medications Prior to Visit  Medication Sig Dispense Refill  . acetaminophen (TYLENOL) 325 MG tablet Take 650 mg by mouth every 6 (six) hours as needed for moderate pain.     Marland Kitchen apixaban (ELIQUIS) 5 MG TABS tablet Take 1 tablet (5 mg total) by mouth 2 (two) times daily. 180 tablet 3  . diltiazem (CARDIZEM) 30 MG tablet Take 1 tablet (30 mg total) by mouth every 8 (eight) hours. 90 tablet 6  . esomeprazole (NEXIUM) 20 MG capsule Take 20 mg by mouth daily at 12 noon.    . ferrous sulfate 325 (65 FE) MG tablet Take 1 tablet (325 mg total) by mouth 2 (two) times daily with a meal.  3  . HYDROcodone-acetaminophen (NORCO/VICODIN) 5-325 MG tablet Take 1 tablet by mouth every 6 (six) hours as needed for moderate pain. 60 tablet 0  . isosorbide mononitrate (IMDUR) 30 MG 24 hr tablet Take 1 tablet (30 mg total) by mouth daily. 90 tablet 1  . LORazepam (ATIVAN) 0.5 MG tablet TAKE ONE TABLET BY MOUTH TWICE DAILY AS NEEDED FOR ANXIETY OR SLEEP 60 tablet 5  . methocarbamol (ROBAXIN) 500 MG tablet Take 1 tablet (500 mg total) by mouth every 6 (six) hours as needed for muscle spasms. 80 tablet 0  . metoprolol (LOPRESSOR) 100 MG tablet Take 1 tablet (100 mg total) by mouth 2 (two) times daily. 180 tablet 3  .  nitroGLYCERIN (NITROSTAT) 0.4 MG SL tablet Place 1 tablet (0.4 mg total) under the tongue every 5 (five) minutes as needed for chest pain (x 3 pills). Reported on 09/01/2015 25 tablet 3  . Polyethyl Glycol-Propyl Glycol (SYSTANE OP) Place 1 drop into both eyes 2 (two) times daily as needed (for dry eyes).    . promethazine (PHENERGAN) 12.5 MG tablet TAKE 1 TABLET (12.5 MG TOTAL) BY MOUTH EVERY 8 HOURS AS NEEDED FOR NAUSEA OR VOMITING. 20 tablet 1  . triamcinolone cream (KENALOG) 0.1 % Apply 1 application topically 2 (two) times daily as needed (Eczema on legs). Reported on 07/25/2015    . benazepril (LOTENSIN) 40 MG tablet TAKE 1 TABLET EVERY DAY (Patient not taking: Reported on 05/27/2016) 90 tablet 1  . furosemide (LASIX) 20 MG tablet Take 20 mg daily today and tomorrow then decrease to 20 mg daily on Mondays, Wednesdays and Fridays (Patient not taking: Reported on 05/27/2016) 20 tablet 3  . gabapentin (NEURONTIN) 600 MG tablet TAKE 1 TABLET THREE TIMES DAILY (Patient not taking: Reported on 05/27/2016) 270 tablet 3  . nitroGLYCERIN (NITROSTAT) 0.4 MG SL tablet Place 1 tablet (0.4 mg total) under the tongue every 5 (five) minutes as needed for chest pain (x 3 pills). Reported on 09/01/2015 (Patient not taking: Reported on 05/27/2016) 25 tablet 3  . sertraline (ZOLOFT) 50 MG tablet TAKE 1 TABLET EVERY DAY (Patient not taking: Reported on 05/27/2016) 90 tablet 1  . traMADol (ULTRAM) 50 MG tablet Take 1 tablet (50 mg total) by mouth daily as needed. (Patient not taking: Reported on 05/27/2016) 60 tablet 2  . traZODone (DESYREL) 100 MG tablet TAKE 1/2 TABLET AT BEDTIME (Patient not taking: Reported on 05/27/2016) 45 tablet 1   No facility-administered medications prior to visit.      Allergies:   Patient has no known allergies.   Social History   Social History  . Marital status: Married    Spouse name: N/A  . Number of children:  N/A  . Years of education: N/A   Social History Main Topics  .  Smoking status: Former Smoker    Packs/day: 0.50    Years: 15.00    Types: Cigarettes    Quit date: 07/16/1967  . Smokeless tobacco: Never Used  . Alcohol use No  . Drug use: No  . Sexual activity: Not Asked   Other Topics Concern  . None   Social History Narrative   Married, 2 children.  As of November 2015 her husband has been living in a nursing home for tendon after years as he suffers from multiple medical problems.   Patient does not drink alcohol nor does she smoke or chew tobacco products   Right handed   8th grade   1 cup daily     Family History:  The patient's family history includes Cancer (age of onset: 66) in her sister; Colon cancer (age of onset: 48) in her maternal uncle; Heart Problems in her father; Heart failure in her mother; Kidney cancer (age of onset: 12) in her brother; Osteoarthritis in her mother.   ROS:   Please see the history of present illness.    Review of Systems  Constitution: Positive for weakness and malaise/fatigue.  HENT: Negative.   Eyes: Negative.   Cardiovascular: Positive for chest pain, dyspnea on exertion, leg swelling and palpitations.  Respiratory: Negative.   Hematologic/Lymphatic: Negative.   Musculoskeletal: Negative.  Negative for joint pain.  Gastrointestinal: Negative.   Genitourinary: Negative.   Neurological: Positive for headaches.   All other systems reviewed and are negative.   PHYSICAL EXAM:   VS:  BP 134/90   Pulse 69   Ht _0  (1.6 m)   Wt 191 lb (86.6 kg)   BMI 33.83 kg/m   Physical Exam  GEN: Well nourished, well developed, in no acute distress  Neck: no JVD, carotid bruits, or masses Cardiac:irreg irreg with 2/6 systolic murmur LSB,no rubs, or gallops  Respiratory:  clear to auscultation bilaterally, normal work of breathing GI: soft, nontender, nondistended, + BS Ext: without cyanosis, clubbing, or edema, Good distal pulses bilaterally MS: no deformity or atrophy  Skin: warm and dry, no rash Psych:  euthymic mood, full affect  Wt Readings from Last 3 Encounters:  05/27/16 191 lb (86.6 kg)  04/23/16 188 lb (85.3 kg)  03/26/16 184 lb (83.5 kg)      Studies/Labs Reviewed:   EKG:  EKG is not ordered today.   Recent Labs: 07/25/2015: Brain Natriuretic Peptide 443.2 03/01/2016: ALT 19; BUN 32.5; Creatinine 1.3; HGB 13.5; Platelets 123; Potassium 4.7; Sodium 140   Lipid Panel    Component Value Date/Time   CHOL 130 01/19/2013 1014   TRIG 87.0 01/19/2013 1014   HDL 33.60 (L) 01/19/2013 1014   CHOLHDL 4 01/19/2013 1014   VLDL 17.4 01/19/2013 1014   LDLCALC 79 01/19/2013 1014    Additional studies/ records that were reviewed today include:  CT 03/06/16: IMPRESSION: 1. Stable remote surgical changes from a partial right hemicolectomy. No CT findings for recurrent tumor or metastatic disease. 2. Stable atherosclerotic calcifications involving the thoracic aorta, abdominal aorta and branch vessels including dense 3 vessel coronary artery calcifications. 3. Moderate to large hiatal hernia. 4. Scoliosis and degenerative changes involving the spine. 5. No acute findings in the chest, abdomen or pelvis.     Electronically Signed   By: Marijo Sanes M.D.   On: 03/06/2016 09:42   2Decho 07/27/15:  Study Conclusions   - Left  ventricle: The cavity size was normal. Wall thickness was   increased in a pattern of mild LVH. Systolic function was normal.   The estimated ejection fraction was in the range of 50% to 55%.   Wall motion was normal; there were no regional wall motion   abnormalities. - Mitral valve: There was moderate regurgitation. - Left atrium: The atrium was severely dilated. - Right atrium: The atrium was mildly dilated. - Pulmonary arteries: Systolic pressure was mildly increased. PA   peak pressure: 44 mm Hg (S).   Impressions:   - Low normal LV systolic function; severe LAE; mild RAE; moderate   MR; mild TR; mildly elevated pulmonary pressure.      48 hr  holter 05/10/16 Study Highlights  Atrial fibrillation with controlled heart rates. Highest heart rate 128 bpm Premature ventricular contractions No long pauses.    No changes in therapy.        ASSESSMENT:    1. Persistent atrial fibrillation (HCC)   2. Other chest pain   3. Essential hypertension   4. Chronic diastolic CHF (congestive heart failure) (HCC)      PLAN:  In order of problems listed above:  Persistent atrial fibrillation heart rates pretty well controlled on 48 hour monitor. She did have one episode of her heart rate got up to 128 bpm. She is very symptomatic with this and has chest pain associated with it. Recommend taking an extra Artisan 30 mg as needed. Once she is out of the donut hole in January we can start a longer acting Cardizem. Continue metoprolol and Eliquis. Would like to switch Cardiologists. Will ask. F/U in January.  Chest pain nitroglycerin responsive in the setting of fast heart rates. She did not tolerate them door because of severe headaches. Recommend go to the emergency room for prolonged chest pain. She wants to hold off on nuclear stress test until after she is out of the donut hole.  Essential hypertension controlled  Diastolic heart failure compensated    Medication Adjustments/Labs and Tests Ordered: Current medicines are reviewed at length with the patient today.  Concerns regarding medicines are outlined above.  Medication changes, Labs and Tests ordered today are listed in the Patient Instructions below. Patient Instructions  Medication Instructions:  Your physician recommends that you continue on your current medications as directed. Please refer to the Current Medication list given to you today.   Labwork: -None  Testing/Procedures: -None  Follow-Up: Your physician recommends that you keep your scheduled  follow-up appointment with Dr. Acie Fredrickson.   Any Other Special Instructions Will Be Listed Below (If  Applicable).     If you need a refill on your cardiac medications before your next appointment, please call your pharmacy.      Sumner Boast, PA-C  05/27/2016 10:25 AM    [facility] [place], [place], [place]  [address] Phone: [phone]; Fax: [phone]

## 2016-06-02 ENCOUNTER — Telehealth: Payer: Self-pay | Admitting: Hematology

## 2016-06-02 NOTE — Telephone Encounter (Signed)
Lvm advising appt moved from 11/24 to 12/4 @ 9.15am due to md pal.

## 2016-06-07 ENCOUNTER — Ambulatory Visit: Payer: Commercial Managed Care - HMO | Admitting: Hematology

## 2016-06-07 ENCOUNTER — Other Ambulatory Visit: Payer: Commercial Managed Care - HMO

## 2016-06-16 NOTE — Progress Notes (Signed)
[place]  [phone] [phone]  Clinic Follow Up Note   Patient Care Team: Whitney Lor, MD as PCP - General (Internal Medicine) 06/17/2016  CHIEF COMPLAINTS:  Follow up multifocal colon cancer  Oncology History   Cancer of ascending colon [name])   Staging form: Colon and Rectum, AJCC 7th Edition   - Clinical: Stage IIIB (T4a, N1, M0) - Unsigned      Cancer of ascending colon ([place])   06/02/2014 Initial Diagnosis    Adenocarcinoma, colon. she presented with anemia, nauseam anorexia and abdominal pain and [facility].      06/02/2014 Surgery    exploratory laparotomy, right hemicolectomy, and descending colostomy by Dr. Georgette [name]      06/02/2014 Pathologic Stage    multifocal (2) colon cancer at  ileocecal valve and ascending colon.,  mT4N1Mx, at least stage IIIB, grade 1, MMR normal, surgical margins negative.       06/02/2014 Tumor Marker    CEA 1.7      11/24/2014 Surgery    SIGMOID COLECTOMY AND COLSOTOMY CLOSURE with [name]      03/06/2016 Imaging    CT chest, abdomen and pelvis without contrast showed stable remote surgical changes, no evidence of metastatic disease. Stable atherosclerotic calcifications.        HISTORY OF PRESENTING ILLNESS:  [name] 82 y.o. female is here because of recently diagnosed and resected multifocal colon cancer. I saw him when she was admitted to the hospital, and she is here with her family members for follow-up.  She has a history of chronic anemia and colovaginal fistula as well as diverticulosis with colonic polyps since 2008, was admitted to [facility] on 05/31/2014 With progressive abdominal pain, worse over the last couple of weeks, accompanied by nausea and vomiting, without weight loss. Workup included a CT of the abdomen and pelvis with contrast, which revealed acute small bowel obstruction due to what appears to be a soft tissue mass  (apple core lesion) at the ileocecal valve suspicious for intestinal adenocarcinoma.This mass measures 6 x 4 cm. Up to 8 mm stable pericecal lymph nodes were visualized. No liver or distant metastasis identified. Small volume free fluid in the abdomen and pelvis, Likely reactive, was seen. No free air.  She underwent exploratory laparotomy, right hemicolectomy, and descending colostomy by Dr. Georgette [name] on 06/02/2014. Grossly, there are two tumors identified. The larger tumor is located at the ileocecal valve and consists of an 8.5 cm span of moderately differentiated adenocarcinoma that involves the serosa. This tumor has associated perineural and lymphovascular invasion. The second discontiguous tumor is present in the proximal right colon and spans 4.0 cm and consists of well differentiated adenocarcinoma that is morphologically dissimilar from the larger tumor. This tumor extends into the muscularis propria.Her CEA is 1.7. It is stage mT4, N1b. immunohistochemical stains are normal, with very low probability of microsatellite instability (See details below ).  She was discharged to rehabilitation after her surgery. She has been doing well at to rehabilitation. She had a surgical wound infection a few weeks after surgery, which was treated with oral antibiotics and wound care. She was not very physically active before the surgery due to her multiple arthritis, she is recovering well at the nursing home and is likely going home in a few days. She is able to move around with a walker, has good appetite, surgical site pain is controlled, no nausea or other complaints. She did  developed some UTI symptoms, and started Cipro and probiotics yesterday at the nursing home.  CURRENT THERAPY: Surveillance  INTERIM HISTORY She returns for follow up, she is accompanied by her daughter to the clinic today. She complains of more fatigue lately, still able to do housework, but at a slower pace. She also complains of right  rib cage pain and right low pelvic pain, persistent, mild to moderate, overall stable, she takes tylenol and hydrocordone as needed for the pain. She denies any nausea, or dyspnea. She has chronic diarrhea since the surgery, 3-6 loose bowel movement every day, she does not take Imodium. No fever or chills. Her weight is stable.  MEDICAL HISTORY:  Past Medical History:  Diagnosis Date  . Adenocarcinoma, colon [facility]) Oncologist-- Dr [name]   Multifocal (2) Colon cancer @ ileocecal valve and ascending ( mT4N1Mx), Stage IIIB, grade I, MMR normal , 2 of 16 +lymph nodes, negative surgical margins---  06-02-2014  Right hemicolectomy w/ colostomy  . Allergy   . Anxiety   . CAD (coronary artery disease)    a. LHC 4/09: pLAD 40, mDx 70-75, pLCx 30, mLCx 50, mRCA 90, EF 60% >> PCI: BMS x2 to RCA;  b. Myoview 8/12: normal  . Cataract    bil cateracts removed  . Chronic anemia   . Chronic diastolic CHF (congestive heart failure) (HCC)    a. Echo 912 - Mild LVH, EF 55-60%, no RWMA, Gr 1 DD, mild MR, mild LAE, mild RAE, PASP 59 mmHg (mod to severe pulmo HTN)  . Colostomy in place [facility])    s/p colostomy takedown 5/16  . Colovaginal fistula    s/p colostomy >> colostomy takedown 5/16  . COPD with chronic bronchitis ([place])   . Depression   . Dysrhythmia    A fib  . Essential hypertension   . GERD (gastroesophageal reflux disease)   . History of adenomatous polyp of colon   . History of blood transfusion   . History of DVT of lower extremity   . History of hiatal hernia   . History of TIA (transient ischemic attack)    a. Head CT 6/15: small chronic lacunar infarct in thalamus  . Hyperlipidemia   . OA (osteoarthritis)   . Peripheral neuropathy ([place])   . Pneumonia   . Pulmonary HTN    a. PASP on Echo in 9/12:  59 mmHg  . Renal insufficiency   . Sigmoid diverticulosis    s/p sigmoid colectomy  . Stroke ([place])    TIAs  . Wears dentures   . Wears glasses     SURGICAL HISTORY: Past Surgical  History:  Procedure Laterality Date  . ABDOMINAL HYSTERECTOMY    . ANTERIOR CERVICAL DECOMP/DISCECTOMY FUSION  01-31-2009   C5 -- 7  . APPENDECTOMY    . Bone spurs Bilateral    Feet  . CARDIOVASCULAR STRESS TEST  02-26-2011   Normal lexiscan no exercise study/  no ischemia/  normal LV function and wall motion, ef 67%  . CARPAL TUNNEL RELEASE Right   . CATARACT EXTRACTION W/ INTRAOCULAR LENS  IMPLANT, BILATERAL Bilateral   . CHOLECYSTECTOMY    . COLOSTOMY N/A 06/02/2014   Procedure: DIVERTING DESCENDING END COLOSTOMY;  Surgeon: Whitney Mesa, MD;  Location: [place];  Service: General;  Laterality: N/A;  . CORONARY ANGIOPLASTY WITH STENT PLACEMENT  11-04-2007  dr bensimhon   BMS x2 to RCA/  mild Non-obstructive disease LAD/  normal LVF  . DILATION AND CURETTAGE OF UTERUS    .  EVALUATION UNDER ANESTHESIA WITH ANAL FISTULECTOMY N/A 10/13/2014   Procedure: ANAL EXAM UNDER ANESTHESIA ;  Surgeon: [name], MD;  Location: [facility];  Service: General;  Laterality: N/A;  . HAND SURGERY     Tendon repair  . LAPAROSCOPIC SIGMOID COLECTOMY N/A 11/24/2014   Procedure: SIGMOID COLECTOMY AND COLSOTOMY CLOSURE;  Surgeon: Whitney Mesa, MD;  Location: [place];  Service: General;  Laterality: N/A;  . LUMBAR LAMINECTOMY  10-29-2002,  1969   Left  L3 -- 4  decompression  . PARTIAL COLECTOMY N/A 06/02/2014   Procedure: RIGHT PARTIAL COLECTOMY ;  Surgeon: Whitney Mesa, MD;  Location: [place];  Service: General;  Laterality: N/A;  . PATELLECTOMY Right 09/14/2013   Procedure: PATELLECTOMY;  Surgeon: Meredith Pel, MD;  Location: [place];  Service: Orthopedics;  Laterality: Right;  . PROCTOSCOPY N/A 10/13/2014   Procedure: RIDGE PROCTOSCOPY;  Surgeon: [name], MD;  Location: [facility];  Service: General;  Laterality: N/A;  . REVISION TOTAL KNEE ARTHROPLASTY Bilateral right  04-02-2011/  left 1996 & 10-05-1999  . ROTATOR CUFF REPAIR Bilateral   . TONSILLECTOMY    . TOTAL KNEE ARTHROPLASTY Bilateral left 1994/   right 2000  . TOTAL KNEE REVISION Left 08/02/2015   Procedure: LEFT FEMORAL REVISION;  Surgeon: Gaynelle Arabian, MD;  Location: [facility];  Service: Orthopedics;  Laterality: Left;  . TRANSTHORACIC ECHOCARDIOGRAM  12-01-2009   Grade I diastolic dysfunction/  ef 60%/  moderate MR/  mild TR    SOCIAL HISTORY: Social History   Social History  . Marital status: Married    Spouse name: N/A  . Number of children: N/A  . Years of education: N/A   Occupational History  . Not on file.   Social History Main Topics  . Smoking status: Former Smoker    Packs/day: 0.50    Years: 15.00    Types: Cigarettes    Quit date: 07/16/1967  . Smokeless tobacco: Never Used  . Alcohol use No  . Drug use: No  . Sexual activity: Not on file   Other Topics Concern  . Not on file   Social History Narrative   Married, 2 children.  As of November 2015 her husband has been living in a nursing home for tendon after years as he suffers from multiple medical problems.   Patient does not drink alcohol nor does she smoke or chew tobacco products   Right handed   8th grade   1 cup daily   She worked for [employer] for 40 years.    FAMILY HISTORY: Family History  Problem Relation Age of Onset  . Osteoarthritis Mother   . Heart failure Mother   . Colon cancer Maternal Uncle 1  . Heart Problems Father   . Cancer Sister 61    ? colon cancer   . Kidney cancer Brother 81  . Esophageal cancer Neg Hx   . Rectal cancer Neg Hx   . Stomach cancer Neg Hx     ALLERGIES:  has No Known Allergies.  MEDICATIONS:  Current Outpatient Prescriptions  Medication Sig Dispense Refill  . acetaminophen (TYLENOL) 325 MG tablet Take 650 mg by mouth every 6 (six) hours as needed for moderate pain.     Marland Kitchen apixaban (ELIQUIS) 5 MG TABS tablet Take 1 tablet (5 mg total) by mouth 2 (two) times daily. 180 tablet 3  . benazepril (LOTENSIN) 40 MG tablet Take 40 mg by mouth daily.    Marland Kitchen diltiazem (CARDIZEM) 30 MG  tablet Take 1 tablet  (30 mg total) by mouth every 8 (eight) hours. 90 tablet 6  . esomeprazole (NEXIUM) 20 MG capsule Take 20 mg by mouth daily at 12 noon.    . ferrous sulfate 325 (65 FE) MG tablet Take 1 tablet (325 mg total) by mouth 2 (two) times daily with a meal.  3  . furosemide (LASIX) 20 MG tablet Take 20 mg by mouth 3 (three) times a week. (Mondays, Wednesdays, and Fridays)    . gabapentin (NEURONTIN) 600 MG tablet Take 600 mg by mouth 3 (three) times daily.    Marland Kitchen HYDROcodone-acetaminophen (NORCO/VICODIN) 5-325 MG tablet Take 1 tablet by mouth every 6 (six) hours as needed for moderate pain. 60 tablet 0  . isosorbide mononitrate (IMDUR) 30 MG 24 hr tablet Take 1 tablet (30 mg total) by mouth daily. 90 tablet 1  . LORazepam (ATIVAN) 0.5 MG tablet TAKE ONE TABLET BY MOUTH TWICE DAILY AS NEEDED FOR ANXIETY OR SLEEP 60 tablet 5  . methocarbamol (ROBAXIN) 500 MG tablet Take 1 tablet (500 mg total) by mouth every 6 (six) hours as needed for muscle spasms. 80 tablet 0  . metoprolol (LOPRESSOR) 100 MG tablet Take 1 tablet (100 mg total) by mouth 2 (two) times daily. 180 tablet 3  . nitroGLYCERIN (NITROSTAT) 0.4 MG SL tablet Place 1 tablet (0.4 mg total) under the tongue every 5 (five) minutes as needed for chest pain (x 3 pills). Reported on 09/01/2015 25 tablet 3  . Polyethyl Glycol-Propyl Glycol (SYSTANE OP) Place 1 drop into both eyes 2 (two) times daily as needed (for dry eyes).    . promethazine (PHENERGAN) 12.5 MG tablet TAKE 1 TABLET (12.5 MG TOTAL) BY MOUTH EVERY 8 HOURS AS NEEDED FOR NAUSEA OR VOMITING. 20 tablet 1  . sertraline (ZOLOFT) 50 MG tablet Take 50 mg by mouth daily.    . traMADol (ULTRAM) 50 MG tablet Take 50 mg by mouth daily as needed for moderate pain.    . traZODone (DESYREL) 100 MG tablet Take 50 mg by mouth at bedtime.    . triamcinolone cream (KENALOG) 0.1 % Apply 1 application topically 2 (two) times daily as needed (Eczema on legs). Reported on 07/25/2015     No current facility-administered  medications for this visit.     REVIEW OF SYSTEMS:   Constitutional: Denies fevers, chills or abnormal night sweats Eyes: Denies blurriness of vision, double vision or watery eyes Ears, nose, mouth, throat, and face: Denies mucositis or sore throat Respiratory: Denies cough, dyspnea or wheezes Cardiovascular: Denies palpitation, chest discomfort or lower extremity swelling Gastrointestinal:  Denies nausea, heartburn or change in bowel habits Skin: Denies abnormal skin rashes Lymphatics: Denies new lymphadenopathy or easy bruising Neurological:Denies numbness, tingling or new weaknesses Behavioral/Psych: Mood is stable, no new changes  All other systems were reviewed with the patient and are negative.  PHYSICAL EXAMINATION: ECOG PERFORMANCE STATUS: 3  Vitals:   06/17/16 1012  BP: 138/87  Pulse: 83  Resp: 18  Temp: 97.3 F (36.3 [name])   Filed Weights   06/17/16 1012  Weight: 187 lb 9.6 oz (85.1 kg)    GENERAL:alert, no distress and comfortable SKIN: skin color, texture, turgor are normal, no rashes or significant lesions EYES: normal, conjunctiva are pink and non-injected, sclera clear OROPHARYNX:no exudate, no erythema and lips, buccal mucosa, and tongue normal  NECK: supple, thyroid normal size, non-tender, without nodularity LYMPH:  no palpable lymphadenopathy in the cervical, axillary or inguinal LUNGS: clear to auscultation  and percussion with normal breathing effort HEART: regular rate & rhythm and no murmurs and no lower extremity edema ABDOMEN:abdomen soft, the midline surgical wound is well healed, appears clean. Mild tenderness at RLQ  and normal bowel sounds Musculoskeletal:no cyanosis of digits and no clubbing  PSYCH: alert & oriented x 3 with fluent speech NEURO: no focal motor/sensory deficits.   LABORATORY DATA:  I have reviewed the data as listed CBC Latest Ref Rng & Units 06/17/2016 03/01/2016 01/09/2016  WBC 3.9 - 10.3 10e3/uL 6.3 7.0 6.1  Hemoglobin 11.6 -  15.9 g/dL 11.5(L) 13.5 12.6  Hematocrit 34.8 - 46.6 % 37.6 41.9 39.5  Platelets 145 - 400 10e3/uL 156 123(L) 134(L)    CMP Latest Ref Rng & Units 03/01/2016 01/09/2016 09/21/2015  Glucose 70 - 140 mg/dl 105 95 95  BUN 7.0 - 26.0 mg/dL 32.5(H) 26.6(H) 35(H)  Creatinine 0.6 - 1.1 mg/dL 1.3(H) 1.4(H) 1.43(H)  Sodium 136 - 145 mEq/L 140 140 139  Potassium 3.5 - 5.1 mEq/L 4.7 4.9 5.1  Chloride 98 - 110 mmol/L - - 102  CO2 22 - 29 mEq/L '25 27 27  '$ Calcium 8.4 - 10.4 mg/dL 9.4 9.1 9.5  Total Protein 6.4 - 8.3 g/dL 7.6 7.3 -  Total Bilirubin 0.20 - 1.20 mg/dL 0.36 0.35 -  Alkaline Phos 40 - 150 U/L 79 75 -  AST 5 - 34 U/L 23 24 -  ALT 0 - 55 U/L 19 19 -   CEA (IN HOUSE-[facility])  Order: [id number]  Status:  Final result Visible to patient:  No (Not Released) Next appt:  06/18/2016 at 10:00 AM in [place] Nyoka Cowden, MD)    Ref Range & Units 09:50 63moago   CEA ([facility]-In House) 0.00 - 5.00 ng/mL 2.91  3.05CM         Pathology report: 06/02/2014 Colon, segmental resection for tumor, right - MULTIFOCAL INVASIVE ADENOCARCINOMA, SPANNING 8.5 CM AND 4.0 CM. - ADENOCARCINOMA INVOLVES THE SEROSA. - LYMPHOVASCULAR INVASION IS IDENTIFIED. 1 of 4 FINAL for [name], [name] ([id number]) Diagnosis(continued) - PERINEURAL INVASION IS IDENTIFIED. - METASTATIC ADENOCARCINOMA IN 2 OF 16 LYMPH NODES (2/16) WITH EXTRACAPSULAR EXTENSION. - THE SURGICAL RESECTION MARGINS ARE NEGATIVE FOR ADENOCARCINOMA. - SEE ONCOLOGY TABLE BELOW. Microscopic Comment COLON AND RECTUM (INCLUDING TRANS-ANAL RESECTION): Specimen: Terminal ileum and right colon Procedure: Resection Tumor site: Ileocecal valve (tumor #1) and proximal mid ascending colon (tumor #2) Specimen integrity: Intact Macroscopic intactness of mesorectum: N/A Macroscopic tumor perforation: Not identified Invasive tumor: Tumor #1 (ileocecal valve) Maximum size: 8.5 cm (gross measurement) Histologic type(s): Adenocarcinoma Histologic  grade and differentiation: G2: moderately differentiated/low grade Type of polyp in which invasive carcinoma arose: Likely tubular adenoma Microscopic extension of invasive tumor: Adenocarcinoma involves the serosa Invasive tumor: Tumor #2 (proximal mid ascending colon) Maximum size: 4.0 cm (gross measurement) Histologic type(s): Adenocarcinoma Histologic grade and differentiation: G1: well differentiated/low grade Type of polyp in which invasive carcinoma arose: Tubulovillous adenoma Microscopic extension of invasive tumor: Adenocarcinoma extends into the muscularis propria Lymph-Vascular invasion: Present Peri-neural invasion: Present Tumor deposit(s) (discontinuous extramural extension): Not identified Resection margins: Proximal margin: 32.0 cm Distal margin: 7.5 cm Circumferential (radial) (posterior ascending, posterior descending; lateral and posterior mid-rectum; and entire lower 1/3 rectum): 3.0 cm Treatment effect (neo-adjuvant therapy): N/A Additional polyp(s): Not identified Non-neoplastic findings: No significant findings Lymph nodes: number examined 16; number positive: 2 Pathologic Staging: mT4, N1b Ancillary studies: MSI testing will be performed on the larger higher grade tumor and the results reported  separately. Additional studies can be performed upon clinician request. Comments: Grossly, there are two tumors identified. The larger tumor is located at the ileocecal valve and consists of an 8.5 cm span of moderately differentiated adenocarcinoma that involves the serosa. This tumor has associated perineural and lymphovascular invasion. The second discontiguous tumor is present in the proximal right colon and spans 4.0 cm and consists of well differentiated adenocarcinoma that is morphologically dissimilar from the larger tumor. This tumor extends into the muscularis propria. Enid Cutter MD Pathologist, Electronic Signature (Case signed 06/06/2014) Specimen Gross and  Clinical Information   RADIOGRAPHIC STUDIES: I have personally reviewed the radiological images as listed and agreed with the findings in the report.  Ct abdomen and pelvis with contrast 03/06/2016 IMPRESSION: 1. Stable remote surgical changes from a partial right hemicolectomy. No CT findings for recurrent tumor or metastatic disease. 2. Stable atherosclerotic calcifications involving the thoracic aorta, abdominal aorta and branch vessels including dense 3 vessel coronary artery calcifications. 3. Moderate to large hiatal hernia. 4. Scoliosis and degenerative changes involving the spine. 5. No acute findings in the chest, abdomen or pelvis.   ASSESSMENT & PLAN:  82 year old Caucasian female, with multiple medical comorbidities, presented with anemia, abdominal pain and nausea. She underwent exploratory laparotomy, right hemicolectomy, and descending colostomy by Dr. Georgette [name] on 06/02/2014. Surgical path reviewed multifocal (2) colon cancer at  ileocecal valve and ascending colon.  1. Multifocal invasive colon adenocarcinoma, mT4N1Mx, at least stage IIIB, grade 1, MMR normal. -I previously reviewed her surgical findings and her CT of abdomen and pelvis with patient and her extended family members in great detail today. Both of her colon cancer were completely surgically resected, however she does have high risk of cancer recurrence in the future due to locally advanced stage.  -Her surveillance CT scan from 03/06/2016 showed no evidence of recurrence, I discussed the scan findings with patient and her daughter. -I reviewed her lab today. Her CBC and CMP are unremarkable except mild anemia which is new from last visit,  CEA has been normal. -Her clinical exam was unremarkable except mild right abd tenderness, which is not new  -She attempted colonoscopy, but procedure was aborted due to her high blood pressure and complicated cardiac history.  -Due to her newly developed anemia, and persistent  abdominal pain, I encouraged her to try colonoscopy again with Dr. Henrene Pastor. Her blood pressure has been well-controlled lately. -We'll continue surveillance, due to her high risk recurrence, I'll obtain a CT scan in 3 months  2. Genetics -Due to her family history of colon cancer, and her daughters multifocal polyps, I recommend her to have a genetic counseling. Her daughter met our genetic counselor before and genetic testing was negative. -She declined genetic testing at this point.  3. Anemia -resolved previously but she has developed mild anemia can now -I'll repeat her ferritin and iron study today, to see if she needs IV iron -Continue oral iron supplement.   4. CAD, CHF -She will continue follow-up with her cardiologist Dr. Kathlen Mody -I discussed the CT scan findings of atherosclerosis, which involves her aorta and 3 coronary artery vessel, she does have frequent chest pain, I strongly encouraged her to see Dr. Kathlen Mody soon.  Follow-up with her primary care physician for other medical issues.  Plan return to clinic in 3 months, lab and CT scan a few days before  I will call her tomorrow if she needs IV iron See Dr. Henrene Pastor for colonoscopy, I will send a message to  Dr. Henrene Pastor   All questions were answered. The patient knows to call the clinic with any problems, questions or concerns.  I spent 20 minutes counseling the patient face to face. The total time spent in the appointment was 25 minutes and more than 50% was on counseling.     Whitney Merle, MD 06/17/2016 10:41 AM

## 2016-06-17 ENCOUNTER — Telehealth: Payer: Self-pay | Admitting: Hematology

## 2016-06-17 ENCOUNTER — Other Ambulatory Visit (HOSPITAL_BASED_OUTPATIENT_CLINIC_OR_DEPARTMENT_OTHER): Payer: Commercial Managed Care - HMO | Admitting: *Deleted

## 2016-06-17 ENCOUNTER — Encounter: Payer: Self-pay | Admitting: Hematology

## 2016-06-17 ENCOUNTER — Ambulatory Visit (HOSPITAL_BASED_OUTPATIENT_CLINIC_OR_DEPARTMENT_OTHER): Payer: Commercial Managed Care - HMO | Admitting: Hematology

## 2016-06-17 ENCOUNTER — Other Ambulatory Visit (HOSPITAL_BASED_OUTPATIENT_CLINIC_OR_DEPARTMENT_OTHER): Payer: Commercial Managed Care - HMO

## 2016-06-17 VITALS — BP 138/87 | HR 83 | Temp 97.3°F | Resp 18 | Ht 63.0 in | Wt 187.6 lb

## 2016-06-17 DIAGNOSIS — C182 Malignant neoplasm of ascending colon: Secondary | ICD-10-CM

## 2016-06-17 DIAGNOSIS — I2510 Atherosclerotic heart disease of native coronary artery without angina pectoris: Secondary | ICD-10-CM

## 2016-06-17 LAB — CBC WITH DIFFERENTIAL/PLATELET
BASO%: 0.2 % (ref 0.0–2.0)
Basophils Absolute: 0 10*3/uL (ref 0.0–0.1)
EOS%: 0.8 % (ref 0.0–7.0)
Eosinophils Absolute: 0.1 10*3/uL (ref 0.0–0.5)
HCT: 37.6 % (ref 34.8–46.6)
HGB: 11.5 g/dL — ABNORMAL LOW (ref 11.6–15.9)
LYMPH%: 19.8 % (ref 14.0–49.7)
MCH: 27.3 pg (ref 25.1–34.0)
MCHC: 30.6 g/dL — ABNORMAL LOW (ref 31.5–36.0)
MCV: 89.1 fL (ref 79.5–101.0)
MONO#: 0.5 10*3/uL (ref 0.1–0.9)
MONO%: 8.1 % (ref 0.0–14.0)
NEUT#: 4.5 10*3/uL (ref 1.5–6.5)
NEUT%: 71.1 % (ref 38.4–76.8)
Platelets: 156 10*3/uL (ref 145–400)
RBC: 4.22 10*6/uL (ref 3.70–5.45)
RDW: 14.1 % (ref 11.2–14.5)
WBC: 6.3 10*3/uL (ref 3.9–10.3)
lymph#: 1.2 10*3/uL (ref 0.9–3.3)

## 2016-06-17 LAB — COMPREHENSIVE METABOLIC PANEL
ALT: 30 U/L (ref 0–55)
AST: 34 U/L (ref 5–34)
Albumin: 3.8 g/dL (ref 3.5–5.0)
Alkaline Phosphatase: 82 U/L (ref 40–150)
Anion Gap: 9 mEq/L (ref 3–11)
BUN: 20.9 mg/dL (ref 7.0–26.0)
CO2: 28 mEq/L (ref 22–29)
Calcium: 9.5 mg/dL (ref 8.4–10.4)
Chloride: 105 mEq/L (ref 98–109)
Creatinine: 1.2 mg/dL — ABNORMAL HIGH (ref 0.6–1.1)
EGFR: 42 mL/min/{1.73_m2} — ABNORMAL LOW (ref >90)
Glucose: 83 mg/dl (ref 70–140)
Potassium: 4.6 mEq/L (ref 3.5–5.1)
Sodium: 142 mEq/L (ref 136–145)
Total Bilirubin: 0.48 mg/dL (ref 0.20–1.20)
Total Protein: 7.6 g/dL (ref 6.4–8.3)

## 2016-06-17 LAB — IRON AND TIBC
%SAT: 11 % — ABNORMAL LOW (ref 21–57)
Iron: 43 ug/dL (ref 41–142)
TIBC: 400 ug/dL (ref 236–444)
UIBC: 357 ug/dL (ref 120–384)

## 2016-06-17 LAB — CEA (IN HOUSE-CHCC): CEA (CHCC-In House): 2.91 ng/mL (ref 0.00–5.00)

## 2016-06-17 LAB — FERRITIN: Ferritin: 23 ng/ml (ref 9–269)

## 2016-06-17 NOTE — Telephone Encounter (Signed)
Appointments scheduled per 06/17/16 los. A copy of the AVS report and appointment schedule was given to the patient, per 06/17/16 los. °

## 2016-06-18 ENCOUNTER — Encounter: Payer: Self-pay | Admitting: Internal Medicine

## 2016-06-18 ENCOUNTER — Ambulatory Visit (INDEPENDENT_AMBULATORY_CARE_PROVIDER_SITE_OTHER): Payer: Commercial Managed Care - HMO | Admitting: Internal Medicine

## 2016-06-18 VITALS — BP 118/64 | HR 82 | Temp 98.0°F | Ht 63.0 in | Wt 190.0 lb

## 2016-06-18 DIAGNOSIS — I4819 Other persistent atrial fibrillation: Secondary | ICD-10-CM

## 2016-06-18 DIAGNOSIS — E7800 Pure hypercholesterolemia, unspecified: Secondary | ICD-10-CM

## 2016-06-18 DIAGNOSIS — I481 Persistent atrial fibrillation: Secondary | ICD-10-CM

## 2016-06-18 DIAGNOSIS — C182 Malignant neoplasm of ascending colon: Secondary | ICD-10-CM

## 2016-06-18 DIAGNOSIS — I5032 Chronic diastolic (congestive) heart failure: Secondary | ICD-10-CM

## 2016-06-18 DIAGNOSIS — I10 Essential (primary) hypertension: Secondary | ICD-10-CM

## 2016-06-18 LAB — CEA: CEA: 2.6 ng/mL (ref 0.0–4.7)

## 2016-06-18 MED ORDER — LORAZEPAM 0.5 MG PO TABS: ORAL_TABLET | ORAL | 5 refills | Status: DC

## 2016-06-18 MED ORDER — HYDROCODONE-ACETAMINOPHEN 5-325 MG PO TABS: 1.0000 | ORAL_TABLET | Freq: Four times a day (QID) | ORAL | 0 refills | Status: DC | PRN

## 2016-06-18 NOTE — Progress Notes (Signed)
Subjective:    Patient ID: [name], female    DOB: June 23, 1930, 82 y.o.   MRN: [id number]  HPI  Wt Readings from Last 3 Encounters:  06/18/16 190 lb (86.2 kg)  06/17/16 187 lb 9.6 oz (85.1 kg)  05/27/16 191 lb (7.55 kg)   82 year old patient who is seen today in follow-up.  She was seen yesterday by oncology for follow-up of stage IIIB cancer of the colon. Complaints today include some nonspecific right-sided abdominal pain which has been intermittent over the past 6 months.  She also describes some generalized weakness and diminished appetite.  There is been no weight loss. She has a history of atrial fibrillation and remains on anticoagulation Laboratory studies yesterday revealed mild anemia which is a new finding.  She is scheduled for a follow-up abdominal CT scan in 3 months.  Referral was sent to GI for consideration of follow-up colonoscopy  The patient continues to live independently but is having a much more difficult time with chores about the house.  Yesterday, the patient gave a history of chest pain.  No complaints of chest pain today.  Patient has noted to have coronary artery calcification on prior scanning.  She is followed by cardiology due to atrial fibrillation  Past Medical History:  Diagnosis Date  . Adenocarcinoma, colon [facility]) Oncologist-- Dr Truitt Merle   Multifocal (2) Colon cancer @ ileocecal valve and ascending ( [id number]), Stage IIIB, grade I, MMR normal , 2 of 16 +lymph nodes, negative surgical margins---  06-02-2014  Right hemicolectomy w/ colostomy  . Allergy   . Anxiety   . CAD (coronary artery disease)    a. LHC 4/09: pLAD 40, mDx 70-75, pLCx 30, mLCx 50, mRCA 90, EF 60% >> PCI: BMS x2 to RCA;  b. Myoview 8/12: normal  . Cataract    bil cateracts removed  . Chronic anemia   . Chronic diastolic CHF (congestive heart failure) (HCC)    a. Echo 912 - Mild LVH, EF 55-60%, no RWMA, Gr 1 DD, mild MR, mild LAE, mild RAE, PASP 59 mmHg (mod to severe pulmo  HTN)  . Colostomy in place [facility])    s/p colostomy takedown 5/16  . Colovaginal fistula    s/p colostomy >> colostomy takedown 5/16  . COPD with chronic bronchitis ([place])   . Depression   . Dysrhythmia    A fib  . Essential hypertension   . GERD (gastroesophageal reflux disease)   . History of adenomatous polyp of colon   . History of blood transfusion   . History of DVT of lower extremity   . History of hiatal hernia   . History of TIA (transient ischemic attack)    a. Head CT 6/15: small chronic lacunar infarct in thalamus  . Hyperlipidemia   . OA (osteoarthritis)   . Peripheral neuropathy ([place])   . Pneumonia   . Pulmonary HTN    a. PASP on Echo in 9/12:  59 mmHg  . Renal insufficiency   . Sigmoid diverticulosis    s/p sigmoid colectomy  . Stroke ([place])    TIAs  . Wears dentures   . Wears glasses      Social History   Social History  . Marital status: Married    Spouse name: N/A  . Number of children: N/A  . Years of education: N/A   Occupational History  . Not on file.   Social History Main Topics  . Smoking status: Former Smoker  Packs/day: 0.50    Years: 15.00    Types: Cigarettes    Quit date: 07/16/1967  . Smokeless tobacco: Never Used  . Alcohol use No  . Drug use: No  . Sexual activity: Not on file   Other Topics Concern  . Not on file   Social History Narrative   Married, 2 children.  As of November 2015 her husband has been living in a nursing home for tendon after years as he suffers from multiple medical problems.   Patient does not drink alcohol nor does she smoke or chew tobacco products   Right handed   8th grade   1 cup daily    Past Surgical History:  Procedure Laterality Date  . ABDOMINAL HYSTERECTOMY    . ANTERIOR CERVICAL DECOMP/DISCECTOMY FUSION  01-31-2009   C5 -- 7  . APPENDECTOMY    . Bone spurs Bilateral    Feet  . CARDIOVASCULAR STRESS TEST  02-26-2011   Normal lexiscan no exercise study/  no ischemia/  normal LV  function and wall motion, ef 67%  . CARPAL TUNNEL RELEASE Right   . CATARACT EXTRACTION W/ INTRAOCULAR LENS  IMPLANT, BILATERAL Bilateral   . CHOLECYSTECTOMY    . COLOSTOMY N/A 06/02/2014   Procedure: DIVERTING DESCENDING END COLOSTOMY;  Surgeon: Donnie Mesa, MD;  Location: [place];  Service: General;  Laterality: N/A;  . CORONARY ANGIOPLASTY WITH STENT PLACEMENT  11-04-2007  dr bensimhon   BMS x2 to RCA/  mild Non-obstructive disease LAD/  normal LVF  . DILATION AND CURETTAGE OF UTERUS    . EVALUATION UNDER ANESTHESIA WITH ANAL FISTULECTOMY N/A 10/13/2014   Procedure: ANAL EXAM UNDER ANESTHESIA ;  Surgeon: [name], MD;  Location: [facility];  Service: General;  Laterality: N/A;  . HAND SURGERY     Tendon repair  . LAPAROSCOPIC SIGMOID COLECTOMY N/A 11/24/2014   Procedure: SIGMOID COLECTOMY AND COLSOTOMY CLOSURE;  Surgeon: Donnie Mesa, MD;  Location: [place];  Service: General;  Laterality: N/A;  . LUMBAR LAMINECTOMY  10-29-2002,  1969   Left  L3 -- 4  decompression  . PARTIAL COLECTOMY N/A 06/02/2014   Procedure: RIGHT PARTIAL COLECTOMY ;  Surgeon: Donnie Mesa, MD;  Location: [place];  Service: General;  Laterality: N/A;  . PATELLECTOMY Right 09/14/2013   Procedure: PATELLECTOMY;  Surgeon: Meredith Pel, MD;  Location: [place];  Service: Orthopedics;  Laterality: Right;  . PROCTOSCOPY N/A 10/13/2014   Procedure: RIDGE PROCTOSCOPY;  Surgeon: [name], MD;  Location: [facility];  Service: General;  Laterality: N/A;  . REVISION TOTAL KNEE ARTHROPLASTY Bilateral right  04-02-2011/  left 1996 & 10-05-1999  . ROTATOR CUFF REPAIR Bilateral   . TONSILLECTOMY    . TOTAL KNEE ARTHROPLASTY Bilateral left 1994/  right 2000  . TOTAL KNEE REVISION Left 08/02/2015   Procedure: LEFT FEMORAL REVISION;  Surgeon: Gaynelle Arabian, MD;  Location: [facility];  Service: Orthopedics;  Laterality: Left;  . TRANSTHORACIC ECHOCARDIOGRAM  12-01-2009   Grade I diastolic dysfunction/  ef 60%/  moderate MR/  mild TR     Family History  Problem Relation Age of Onset  . Osteoarthritis Mother   . Heart failure Mother   . Colon cancer Maternal Uncle 78  . Heart Problems Father   . Cancer Sister 40    ? colon cancer   . Kidney cancer Brother 74  . Esophageal cancer Neg Hx   . Rectal cancer Neg Hx   . Stomach cancer Neg Hx  No Known Allergies  Current Outpatient Prescriptions on File Prior to Visit  Medication Sig Dispense Refill  . acetaminophen (TYLENOL) 325 MG tablet Take 650 mg by mouth every 6 (six) hours as needed for moderate pain.     Marland Kitchen apixaban (ELIQUIS) 5 MG TABS tablet Take 1 tablet (5 mg total) by mouth 2 (two) times daily. 180 tablet 3  . benazepril (LOTENSIN) 40 MG tablet Take 40 mg by mouth daily.    Marland Kitchen diltiazem (CARDIZEM) 30 MG tablet Take 1 tablet (30 mg total) by mouth every 8 (eight) hours. 90 tablet 6  . esomeprazole (NEXIUM) 20 MG capsule Take 20 mg by mouth daily at 12 noon.    . ferrous sulfate 325 (65 FE) MG tablet Take 1 tablet (325 mg total) by mouth 2 (two) times daily with a meal.  3  . furosemide (LASIX) 20 MG tablet Take 20 mg by mouth 3 (three) times a week. (Mondays, Wednesdays, and Fridays)    . gabapentin (NEURONTIN) 600 MG tablet Take 600 mg by mouth 3 (three) times daily.    . isosorbide mononitrate (IMDUR) 30 MG 24 hr tablet Take 1 tablet (30 mg total) by mouth daily. 90 tablet 1  . methocarbamol (ROBAXIN) 500 MG tablet Take 1 tablet (500 mg total) by mouth every 6 (six) hours as needed for muscle spasms. 80 tablet 0  . metoprolol (LOPRESSOR) 100 MG tablet Take 1 tablet (100 mg total) by mouth 2 (two) times daily. 180 tablet 3  . nitroGLYCERIN (NITROSTAT) 0.4 MG SL tablet Place 1 tablet (0.4 mg total) under the tongue every 5 (five) minutes as needed for chest pain (x 3 pills). Reported on 09/01/2015 25 tablet 3  . Polyethyl Glycol-Propyl Glycol (SYSTANE OP) Place 1 drop into both eyes 2 (two) times daily as needed (for dry eyes).    . promethazine (PHENERGAN)  12.5 MG tablet TAKE 1 TABLET (12.5 MG TOTAL) BY MOUTH EVERY 8 HOURS AS NEEDED FOR NAUSEA OR VOMITING. 20 tablet 1  . sertraline (ZOLOFT) 50 MG tablet Take 50 mg by mouth daily.    . traMADol (ULTRAM) 50 MG tablet Take 50 mg by mouth daily as needed for moderate pain.    . traZODone (DESYREL) 100 MG tablet Take 50 mg by mouth at bedtime.    . triamcinolone cream (KENALOG) 0.1 % Apply 1 application topically 2 (two) times daily as needed (Eczema on legs). Reported on 07/25/2015     No current facility-administered medications on file prior to visit.     BP 118/64 (BP Location: Right Arm, Patient Position: Sitting, Cuff Size: Large)   Pulse 82   Temp 98 F (36.7 C) (Oral)   Ht 5' 3" (1.6 m)   Wt 190 lb (86.2 kg)   SpO2 97%   BMI 33.66 kg/m    Review of Systems  Constitutional: Positive for activity change, appetite change and fatigue.  HENT: Negative for congestion, dental problem, hearing loss, rhinorrhea, sinus pressure, sore throat and tinnitus.   Eyes: Negative for pain, discharge and visual disturbance.  Respiratory: Negative for cough and shortness of breath.   Cardiovascular: Negative for chest pain, palpitations and leg swelling.  Gastrointestinal: Positive for abdominal pain. Negative for abdominal distention, blood in stool, constipation, diarrhea, nausea and vomiting.  Genitourinary: Negative for difficulty urinating, dysuria, flank pain, frequency, hematuria, pelvic pain, urgency, vaginal bleeding, vaginal discharge and vaginal pain.  Musculoskeletal: Negative for arthralgias, gait problem and joint swelling.  Skin: Negative for rash.  Neurological:  Positive for weakness. Negative for dizziness, syncope, speech difficulty, numbness and headaches.  Hematological: Negative for adenopathy.  Psychiatric/Behavioral: Negative for agitation, behavioral problems and dysphoric mood. The patient is not nervous/anxious.        Objective:   Physical Exam  Constitutional: She is  oriented to person, place, and time. She appears well-developed and well-nourished.  Blood pressure low normal  HENT:  Head: Normocephalic.  Right Ear: External ear normal.  Left Ear: External ear normal.  Mouth/Throat: Oropharynx is clear and moist.  Eyes: Conjunctivae and EOM are normal. Pupils are equal, round, and reactive to light.  Neck: Normal range of motion. Neck supple. No thyromegaly present.  Cardiovascular: Normal rate, regular rhythm, normal heart sounds and intact distal pulses.   Pulmonary/Chest: Effort normal and breath sounds normal.  Abdominal: Soft. Bowel sounds are normal. She exhibits no mass. There is no tenderness.  Obese, soft Very mild subjective tenderness over the right mid abdominal area.  Bowel sounds active  Musculoskeletal: Normal range of motion.  Lymphadenopathy:    She has no cervical adenopathy.  Neurological: She is alert and oriented to person, place, and time.  Skin: Skin is warm and dry. No rash noted.  Psychiatric: She has a normal mood and affect. Her behavior is normal.          Assessment & Plan:   Stage IIIB colon cancer.  Follow-up abdominal CT in 3 months as scheduled history worsening anemia and right-sided abdominal discomfort.  We'll recheck in 4 weeks with follow-up CBC Essential hypertension, stable Coronary atherosclerosis.  Follow-up cardiology Atrial fibrillation.  Continue anticoagulation Anemia.  Repeat CBC one month  Nyoka Cowden

## 2016-06-18 NOTE — Patient Instructions (Signed)
Limit your sodium (Salt) intake  Return in one month for follow-up  Call or return to clinic prn if these symptoms worsen or fail to improve as anticipated.

## 2016-06-18 NOTE — Progress Notes (Signed)
Pre visit review using our clinic review tool, if applicable. No additional management support is needed unless otherwise documented below in the visit note. 

## 2016-06-20 ENCOUNTER — Telehealth: Payer: Self-pay | Admitting: *Deleted

## 2016-06-20 NOTE — Telephone Encounter (Signed)
-----   Message from Truitt Merle, MD sent at 06/18/2016  9:13 AM EST ----- Please let pt or her daughter know the iron study result was normal (on low side of normal range), OK to continue oral iron. I also have talked to Dr. Henrene Pastor, please let her to call Dr. Blanch Media office for a f/u appointment. Thanks  Truitt Merle 06/18/2016

## 2016-06-20 NOTE — Telephone Encounter (Signed)
Called daughter [name] on cell phone and left messaage requesting a call back to triage nurse about pt's  iron study results .

## 2016-06-20 NOTE — Telephone Encounter (Signed)
Daughter called back & she was given information per Dr Ernestina Penna request.  See previous note.

## 2016-06-21 ENCOUNTER — Telehealth: Payer: Self-pay | Admitting: Cardiovascular Disease

## 2016-06-21 NOTE — Telephone Encounter (Signed)
Patient states that she needs a new 90 day supply of Eliquis. She states that she spoke to someone a few days ago, but could not remember what they told her. I spoke with Vaughan Basta in refills and she is going to investigate. Message will be routed to refill pool and they will call patient with more information. Patient informed and verbalized understanding.

## 2016-06-21 NOTE — Telephone Encounter (Signed)
Spoke with patient just now. I also called [facility]. She needs to confirm her address with [facility]. They are the pharmacy that actually dispenses the Eliquis. I gave her  the phone # to call. Will provide samples so she does not run out.

## 2016-06-21 NOTE — Telephone Encounter (Signed)
New Message  Pt c/o medication issue:  1. Name of Medication: Apixaban (Eliquis)  2. How are you currently taking this medication (dosage and times per day)? 5 mg tablets twice daily totaling 5 mg  3. Are you having a reaction (difficulty breathing--STAT)? No  4. What is your medication issue? Pt voiced she is calling in regards to Eliquis and she spoke with someone prior to about two or three days ago but no one has called her back.  Pt voiced she doesn't remember what the nurse stated regarding Eliquis.  Please f/u with pt

## 2016-06-27 ENCOUNTER — Telehealth: Payer: Self-pay | Admitting: *Deleted

## 2016-06-27 NOTE — Telephone Encounter (Signed)
Telephone call to patient and discussed lab results from last week. All patient's questions were answered. She expressed gratitude for the call. Pt understands to call this office with any questions or concerns.

## 2016-07-03 ENCOUNTER — Other Ambulatory Visit: Payer: Self-pay | Admitting: Physician Assistant

## 2016-07-03 DIAGNOSIS — I10 Essential (primary) hypertension: Secondary | ICD-10-CM

## 2016-07-17 ENCOUNTER — Ambulatory Visit (INDEPENDENT_AMBULATORY_CARE_PROVIDER_SITE_OTHER): Payer: PRIVATE HEALTH INSURANCE | Admitting: Internal Medicine

## 2016-07-17 ENCOUNTER — Ambulatory Visit (INDEPENDENT_AMBULATORY_CARE_PROVIDER_SITE_OTHER)
Admission: RE | Admit: 2016-07-17 | Discharge: 2016-07-17 | Disposition: A | Discharge status: Home / Self Care | Payer: PRIVATE HEALTH INSURANCE | Source: Ambulatory Visit | Attending: Internal Medicine | Admitting: Internal Medicine

## 2016-07-17 ENCOUNTER — Encounter: Payer: Self-pay | Admitting: Internal Medicine

## 2016-07-17 VITALS — BP 138/78 | HR 86 | Temp 97.5°F | Wt 191.0 lb

## 2016-07-17 DIAGNOSIS — C182 Malignant neoplasm of ascending colon: Secondary | ICD-10-CM

## 2016-07-17 DIAGNOSIS — S6000XA Contusion of unspecified finger without damage to nail, initial encounter: Secondary | ICD-10-CM

## 2016-07-17 DIAGNOSIS — I481 Persistent atrial fibrillation: Secondary | ICD-10-CM | POA: Diagnosis not present

## 2016-07-17 DIAGNOSIS — S62345A Nondisplaced fracture of base of fourth metacarpal bone, left hand, initial encounter for closed fracture: Secondary | ICD-10-CM | POA: Diagnosis not present

## 2016-07-17 DIAGNOSIS — I10 Essential (primary) hypertension: Secondary | ICD-10-CM

## 2016-07-17 DIAGNOSIS — D62 Acute posthemorrhagic anemia: Secondary | ICD-10-CM

## 2016-07-17 DIAGNOSIS — I4819 Other persistent atrial fibrillation: Secondary | ICD-10-CM

## 2016-07-17 LAB — CBC WITH DIFFERENTIAL/PLATELET
Basophils Absolute: 0 10*3/uL (ref 0.0–0.1)
Basophils Relative: 0.2 % (ref 0.0–3.0)
Eosinophils Absolute: 0 10*3/uL (ref 0.0–0.7)
Eosinophils Relative: 0.7 % (ref 0.0–5.0)
HCT: 32.5 % — ABNORMAL LOW (ref 36.0–46.0)
Hemoglobin: 10.6 g/dL — ABNORMAL LOW (ref 12.0–15.0)
Lymphocytes Relative: 15.7 % (ref 12.0–46.0)
Lymphs Abs: 1 10*3/uL (ref 0.7–4.0)
MCHC: 32.6 g/dL (ref 30.0–36.0)
MCV: 82.4 fl (ref 78.0–100.0)
Monocytes Absolute: 0.5 10*3/uL (ref 0.1–1.0)
Monocytes Relative: 8 % (ref 3.0–12.0)
Neutro Abs: 5.1 10*3/uL (ref 1.4–7.7)
Neutrophils Relative %: 75.4 % (ref 43.0–77.0)
Platelets: 157 10*3/uL (ref 150.0–400.0)
RBC: 3.94 Mil/uL (ref 3.87–5.11)
RDW: 15.4 % (ref 11.5–15.5)
WBC: 6.7 10*3/uL (ref 4.0–10.5)

## 2016-07-17 NOTE — Patient Instructions (Addendum)
Limit your sodium (Salt) intake  GI and oncology follow-up  Please check your blood pressure on a regular basis.  If it is consistently greater than 150/90, please make an office appointment.  X-ray left hand as discussed

## 2016-07-17 NOTE — Progress Notes (Signed)
Subjective:    Patient ID: [name], female    DOB: 1930/05/04, 83 y.o.   MRN: [id number]  HPI  83 year old patient wh is seen today for her one-month follow-up.  She was seen 4 weeks ago with worsening right-sided abdominal pain and mild anemia.  She is scheduled for GI follow-up soon and is followed closely by oncology.  Repeat imaging studies are scheduled later this spring.  Abdominal pelvic CT unremarkable in August of last year Abdominal pain is improved Her main complaint today is a painful left fourth finger after a recent fall on December 30  . Past Medical History:  Diagnosis Date  . Adenocarcinoma, colon [facility]) Oncologist-- Dr Truitt Merle   Multifocal (2) Colon cancer @ ileocecal valve and ascending ( [id number]), Stage IIIB, grade I, MMR normal , 2 of 16 +lymph nodes, negative surgical margins---  06-02-2014  Right hemicolectomy w/ colostomy  . Allergy   . Anxiety   . CAD (coronary artery disease)    a. LHC 4/09: pLAD 40, mDx 70-75, pLCx 30, mLCx 50, mRCA 90, EF 60% >> PCI: BMS x2 to RCA;  b. Myoview 8/12: normal  . Cataract    bil cateracts removed  . Chronic anemia   . Chronic diastolic CHF (congestive heart failure) (HCC)    a. Echo 912 - Mild LVH, EF 55-60%, no RWMA, Gr 1 DD, mild MR, mild LAE, mild RAE, PASP 59 mmHg (mod to severe pulmo HTN)  . Colostomy in place [facility])    s/p colostomy takedown 5/16  . Colovaginal fistula    s/p colostomy >> colostomy takedown 5/16  . COPD with chronic bronchitis ([place])   . Depression   . Dysrhythmia    A fib  . Essential hypertension   . GERD (gastroesophageal reflux disease)   . History of adenomatous polyp of colon   . History of blood transfusion   . History of DVT of lower extremity   . History of hiatal hernia   . History of TIA (transient ischemic attack)    a. Head CT 6/15: small chronic lacunar infarct in thalamus  . Hyperlipidemia   . OA (osteoarthritis)   . Peripheral neuropathy ([place])   . Pneumonia   . Pulmonary  HTN    a. PASP on Echo in 9/12:  59 mmHg  . Renal insufficiency   . Sigmoid diverticulosis    s/p sigmoid colectomy  . Stroke ([place])    TIAs  . Wears dentures   . Wears glasses      Social History   Social History  . Marital status: Married    Spouse name: N/A  . Number of children: N/A  . Years of education: N/A   Occupational History  . Not on file.   Social History Main Topics  . Smoking status: Former Smoker    Packs/day: 0.50    Years: 15.00    Types: Cigarettes    Quit date: 07/16/1967  . Smokeless tobacco: Never Used  . Alcohol use No  . Drug use: No  . Sexual activity: Not on file   Other Topics Concern  . Not on file   Social History Narrative   Married, 2 children.  As of November 2015 her husband has been living in a nursing home for tendon after years as he suffers from multiple medical problems.   Patient does not drink alcohol nor does she smoke or chew tobacco products   Right handed   8th grade  1 cup daily    Past Surgical History:  Procedure Laterality Date  . ABDOMINAL HYSTERECTOMY    . ANTERIOR CERVICAL DECOMP/DISCECTOMY FUSION  01-31-2009   C5 -- 7  . APPENDECTOMY    . Bone spurs Bilateral    Feet  . CARDIOVASCULAR STRESS TEST  02-26-2011   Normal lexiscan no exercise study/  no ischemia/  normal LV function and wall motion, ef 67%  . CARPAL TUNNEL RELEASE Right   . CATARACT EXTRACTION W/ INTRAOCULAR LENS  IMPLANT, BILATERAL Bilateral   . CHOLECYSTECTOMY    . COLOSTOMY N/A 06/02/2014   Procedure: DIVERTING DESCENDING END COLOSTOMY;  Surgeon: Donnie Mesa, MD;  Location: [place];  Service: General;  Laterality: N/A;  . CORONARY ANGIOPLASTY WITH STENT PLACEMENT  11-04-2007  dr bensimhon   BMS x2 to RCA/  mild Non-obstructive disease LAD/  normal LVF  . DILATION AND CURETTAGE OF UTERUS    . EVALUATION UNDER ANESTHESIA WITH ANAL FISTULECTOMY N/A 10/13/2014   Procedure: ANAL EXAM UNDER ANESTHESIA ;  Surgeon: [name], MD;  Location: [facility];  Service: General;  Laterality: N/A;  . HAND SURGERY     Tendon repair  . LAPAROSCOPIC SIGMOID COLECTOMY N/A 11/24/2014   Procedure: SIGMOID COLECTOMY AND COLSOTOMY CLOSURE;  Surgeon: Donnie Mesa, MD;  Location: [place];  Service: General;  Laterality: N/A;  . LUMBAR LAMINECTOMY  10-29-2002,  1969   Left  L3 -- 4  decompression  . PARTIAL COLECTOMY N/A 06/02/2014   Procedure: RIGHT PARTIAL COLECTOMY ;  Surgeon: Donnie Mesa, MD;  Location: [place];  Service: General;  Laterality: N/A;  . PATELLECTOMY Right 09/14/2013   Procedure: PATELLECTOMY;  Surgeon: Meredith Pel, MD;  Location: [place];  Service: Orthopedics;  Laterality: Right;  . PROCTOSCOPY N/A 10/13/2014   Procedure: RIDGE PROCTOSCOPY;  Surgeon: [name], MD;  Location: [facility];  Service: General;  Laterality: N/A;  . REVISION TOTAL KNEE ARTHROPLASTY Bilateral right  04-02-2011/  left 1996 & 10-05-1999  . ROTATOR CUFF REPAIR Bilateral   . TONSILLECTOMY    . TOTAL KNEE ARTHROPLASTY Bilateral left 1994/  right 2000  . TOTAL KNEE REVISION Left 08/02/2015   Procedure: LEFT FEMORAL REVISION;  Surgeon: Gaynelle Arabian, MD;  Location: [facility];  Service: Orthopedics;  Laterality: Left;  . TRANSTHORACIC ECHOCARDIOGRAM  12-01-2009   Grade I diastolic dysfunction/  ef 60%/  moderate MR/  mild TR    Family History  Problem Relation Age of Onset  . Osteoarthritis Mother   . Heart failure Mother   . Colon cancer Maternal Uncle 11  . Heart Problems Father   . Cancer Sister 72    ? colon cancer   . Kidney cancer Brother 55  . Esophageal cancer Neg Hx   . Rectal cancer Neg Hx   . Stomach cancer Neg Hx     No Known Allergies  Current Outpatient Prescriptions on File Prior to Visit  Medication Sig Dispense Refill  . acetaminophen (TYLENOL) 325 MG tablet Take 650 mg by mouth every 6 (six) hours as needed for moderate pain.     Marland Kitchen apixaban (ELIQUIS) 5 MG TABS tablet Take 1 tablet (5 mg total) by mouth 2 (two) times daily. 180 tablet 3   . benazepril (LOTENSIN) 40 MG tablet Take 40 mg by mouth daily.    Marland Kitchen diltiazem (CARDIZEM) 30 MG tablet Take 1 tablet (30 mg total) by mouth every 8 (eight) hours. 90 tablet 6  . esomeprazole (NEXIUM) 20 MG capsule  Take 20 mg by mouth daily at 12 noon.    . ferrous sulfate 325 (65 FE) MG tablet Take 1 tablet (325 mg total) by mouth 2 (two) times daily with a meal.  3  . furosemide (LASIX) 20 MG tablet Take 20 mg by mouth 3 (three) times a week. (Mondays, Wednesdays, and Fridays)    . gabapentin (NEURONTIN) 600 MG tablet Take 600 mg by mouth 3 (three) times daily.    Marland Kitchen HYDROcodone-acetaminophen (NORCO/VICODIN) 5-325 MG tablet Take 1 tablet by mouth every 6 (six) hours as needed for moderate pain. 60 tablet 0  . isosorbide mononitrate (IMDUR) 30 MG 24 hr tablet Take 1 tablet (30 mg total) by mouth daily. 90 tablet 1  . LORazepam (ATIVAN) 0.5 MG tablet TAKE ONE TABLET BY MOUTH TWICE DAILY AS NEEDED FOR ANXIETY OR SLEEP 60 tablet 5  . methocarbamol (ROBAXIN) 500 MG tablet Take 1 tablet (500 mg total) by mouth every 6 (six) hours as needed for muscle spasms. 80 tablet 0  . metoprolol (LOPRESSOR) 100 MG tablet Take 1 tablet (100 mg total) by mouth 2 (two) times daily. 180 tablet 3  . nitroGLYCERIN (NITROSTAT) 0.4 MG SL tablet Place 1 tablet (0.4 mg total) under the tongue every 5 (five) minutes as needed for chest pain (x 3 pills). Reported on 09/01/2015 25 tablet 3  . Polyethyl Glycol-Propyl Glycol (SYSTANE OP) Place 1 drop into both eyes 2 (two) times daily as needed (for dry eyes).    . promethazine (PHENERGAN) 12.5 MG tablet TAKE 1 TABLET (12.5 MG TOTAL) BY MOUTH EVERY 8 HOURS AS NEEDED FOR NAUSEA OR VOMITING. 20 tablet 1  . sertraline (ZOLOFT) 50 MG tablet Take 50 mg by mouth daily.    . traMADol (ULTRAM) 50 MG tablet Take 50 mg by mouth daily as needed for moderate pain.    . traZODone (DESYREL) 100 MG tablet Take 50 mg by mouth at bedtime.    . triamcinolone cream (KENALOG) 0.1 % Apply 1  application topically 2 (two) times daily as needed (Eczema on legs). Reported on 07/25/2015     No current facility-administered medications on file prior to visit.     BP 138/78 (BP Location: Right Arm, Patient Position: Sitting, Cuff Size: Normal)   Pulse 86   Temp 97.5 F (36.4 C) (Oral)   Wt 191 lb (86.6 kg)   SpO2 96%   BMI 33.83 kg/m     Review of Systems  Constitutional: Negative.   HENT: Negative for congestion, dental problem, hearing loss, rhinorrhea, sinus pressure, sore throat and tinnitus.   Eyes: Negative for pain, discharge and visual disturbance.  Respiratory: Negative for cough and shortness of breath.   Cardiovascular: Negative for chest pain, palpitations and leg swelling.  Gastrointestinal: Positive for abdominal pain. Negative for abdominal distention, blood in stool, constipation, diarrhea, nausea and vomiting.  Genitourinary: Negative for difficulty urinating, dysuria, flank pain, frequency, hematuria, pelvic pain, urgency, vaginal bleeding, vaginal discharge and vaginal pain.  Musculoskeletal: Positive for arthralgias and joint swelling. Negative for gait problem.  Skin: Negative for rash.  Neurological: Negative for dizziness, syncope, speech difficulty, weakness, numbness and headaches.  Hematological: Negative for adenopathy.  Psychiatric/Behavioral: Negative for agitation, behavioral problems and dysphoric mood. The patient is not nervous/anxious.        Objective:   Physical Exam  Constitutional: She is oriented to person, place, and time. She appears well-developed and well-nourished. No distress.  Weight 191 Blood pressure nl  HENT:  Head: Normocephalic.  Right Ear: External ear normal.  Left Ear: External ear normal.  Mouth/Throat: Oropharynx is clear and moist.  Eyes: Conjunctivae and EOM are normal. Pupils are equal, round, and reactive to light.  Neck: Normal range of motion. Neck supple. No thyromegaly present.  Cardiovascular: Normal  rate, regular rhythm, normal heart sounds and intact distal pulses.   Pulmonary/Chest: Effort normal and breath sounds normal.  Abdominal: Soft. Bowel sounds are normal. She exhibits no mass. There is no tenderness.  Musculoskeletal: Normal range of motion.  Soft tissue swelling and ecchymoses involving the left fourth finger, especially over the distal dorsal aspect of the finger Unable to fully flex the fourth finger Minimal soft tissue swelling  Lymphadenopathy:    She has no cervical adenopathy.  Neurological: She is alert and oriented to person, place, and time.  Skin: Skin is warm and dry. No rash noted.  Psychiatric: She has a normal mood and affect. Her behavior is normal.          Assessment & Plan:   Abdominal pain, improved History of colon cancer.  Follow-up oncology and GI History of mild anemia.  Will repeat CBC today Traumatic injury left fourth finger.  Will check a x-ray to rule out fracture Chronic atrial fibrillation Chronic anticoagulation  Follow GI and oncology Return here 3-6 months or as needed  Nyoka Cowden

## 2016-07-17 NOTE — Progress Notes (Signed)
Pre visit review using our clinic review tool, if applicable. No additional management support is needed unless otherwise documented below in the visit note. 

## 2016-07-19 DIAGNOSIS — Z471 Aftercare following joint replacement surgery: Secondary | ICD-10-CM | POA: Diagnosis not present

## 2016-07-19 DIAGNOSIS — Z96652 Presence of left artificial knee joint: Secondary | ICD-10-CM | POA: Diagnosis not present

## 2016-08-01 ENCOUNTER — Ambulatory Visit: Payer: Commercial Managed Care - HMO | Admitting: Internal Medicine

## 2016-08-05 ENCOUNTER — Ambulatory Visit (INDEPENDENT_AMBULATORY_CARE_PROVIDER_SITE_OTHER): Payer: PPO | Admitting: Cardiovascular Disease

## 2016-08-05 ENCOUNTER — Encounter: Payer: Self-pay | Admitting: Cardiovascular Disease

## 2016-08-05 VITALS — BP 136/84 | HR 81 | Ht 63.0 in | Wt 190.8 lb

## 2016-08-05 DIAGNOSIS — I2510 Atherosclerotic heart disease of native coronary artery without angina pectoris: Secondary | ICD-10-CM | POA: Diagnosis not present

## 2016-08-05 DIAGNOSIS — I482 Chronic atrial fibrillation, unspecified: Secondary | ICD-10-CM

## 2016-08-05 DIAGNOSIS — I25118 Atherosclerotic heart disease of native coronary artery with other forms of angina pectoris: Secondary | ICD-10-CM

## 2016-08-05 NOTE — Patient Instructions (Addendum)
Medication Instructions:    Your physician recommends that you continue on your current medications as directed. Please refer to the Current Medication list given to you today.  --- If you need a refill on your cardiac medications before your next appointment, please call your pharmacy. ---  Labwork:  None ordered  Testing/Procedures:  None ordered  Follow-Up:  Your physician wants you to follow-up in: 6 months with Dr. [name].  You will receive a reminder letter in the mail two months in advance. If you don't receive a letter, please call our office to schedule the follow-up appointment.  Thank you for choosing [facility]!!            

## 2016-08-05 NOTE — Progress Notes (Signed)
Cardiology Office Note    Date:  08/05/2016   ID:  [name], DOB 05-15-1930, MRN [id number]  PCP:  Whitney Cowden, MD  Cardiologist: Dr. Burt [name], [name] --->  [name]    Problem List  1. Chronic Afib 2.  Hx of colon cancer  3.  CAD - s/p stenting ,  [place], 2009  4. Moderate pulmonary hypertension 5. Moderate  MR .  6. Hx of Colon cancer.  7. Essential Hypertension 8. Chronic diastolic CHF   Chief Complaint  Patient presents with  . Atrial Fibrillation    History of Present Illness:  [name] is a 83 y.o. female with history of CAD s/p BMS to RCA x 2 in 5732, diastolic CHF, HTN, HLD, persistent afib on Eliquis, chronic dyspnea on exertion, COPD, pulmonary HTN, ? prior TIA, prior DVT, colon CA who presented for evaluation of chest pain.    Colon cancer diagnosed in 11/15. She underwent right hemicolectomy with diverting colostomy. She saw Dr. Domenic [name] in 5/16 and was cleared from a cardiac perspective for colostomy takedown and sigmoid colectomy for diverticulosis.  She was seen by Whitney Jester, PA-C 12/16 for surgical clearance (for L TKR).  ECG was noted to demonstrate new onset AFib with controlled VR.  She underwent L femoral revision of TKR 08/02/15 with Dr. Wynelle Link.  Eliquis started after knee replacement. She has been on metoprolol for rate control.   The patient was seen by oncology (Dr. Truitt [name] 03/08/16). Her surveillance CT scan from 03/06/2016 showed no evidence of recurrence. She was advised to followed up with cardiology due to continuous intermittent left chest pain and CT of Chest 02/2016 showed stable atherosclerotic calcifications in aorta, abdominal aorta and 3 vessels coronary artery.    She saw [name] 03/26/16.She was still in atrial fibrillation with intermittent fast rates. He wanted to place monitor however she deferred due to cost. He added Cardizem 30 mg every 8 hours. Could not add Cardizem CD due to cost. Continued  Lopressor 100 mg twice a day.CHADSVASC=7 She is in the donut hole and can't afford Eliquis. Samples were given and she qualified for assistance. Myoview recommended for NTG responsive chest pain, but she deferred because of cost. CT of chest showed stable coronary calcifications.  I saw her 04/23/16 still complaining of fast heart rates associated with chest pain. I placed a monitor, told her to take extra Cardizem for rapid heart rates and added Imdur.  Holter monitor showed Atrial fibrillation with controlled rates, highest rate 128/m, PVC's, no long pauses.  Patient comes in today accompanied by her daughter. She has severe headache from the Imdur to put her in bed all day long. She has good days and bad days. She continues to of some fast heart rates associated with chest tightness. She doesn't always take the extra diltiazem like I told her until after her heart is racing for a while. She still would like to hold off on any further testing until she is out of the donut hole. She is getting new insurance in January.  Jan. 22, 2018:  [name] is seen for the first time today .  Previous patient of [name] and [name]. Seen with daugher , [name] and 2 great grandchildren.  Has lots of generalized weakness.   [place] a month ago - legs gave .  No syncope .   Is on eliquis for her Afib .   Is on Lasix 20 mg 3 times a week.  Took it daily but this caused worsening of her CKD .   Past Medical History:  Diagnosis Date  . Adenocarcinoma, colon [facility]) Oncologist-- Dr [name]   Multifocal (2) Colon cancer @ ileocecal valve and ascending ( [id number]), Stage IIIB, grade I, MMR normal , 2 of 16 +lymph nodes, negative surgical margins---  06-02-2014  Right hemicolectomy w/ colostomy  . Allergy   . Anxiety   . CAD (coronary artery disease)    a. LHC 4/09: pLAD 40, mDx 70-75, pLCx 30, mLCx 50, mRCA 90, EF 60% >> PCI: BMS x2 to RCA;  b. Myoview 8/12: normal  . Cataract    bil cateracts removed  . Chronic  anemia   . Chronic diastolic CHF (congestive heart failure) (HCC)    a. Echo 912 - Mild LVH, EF 55-60%, no RWMA, Gr 1 DD, mild MR, mild LAE, mild RAE, PASP 59 mmHg (mod to severe pulmo HTN)  . Colostomy in place [facility])    s/p colostomy takedown 5/16  . Colovaginal fistula    s/p colostomy >> colostomy takedown 5/16  . COPD with chronic bronchitis ([place])   . Depression   . Dysrhythmia    A fib  . Essential hypertension   . GERD (gastroesophageal reflux disease)   . History of adenomatous polyp of colon   . History of blood transfusion   . History of DVT of lower extremity   . History of hiatal hernia   . History of TIA (transient ischemic attack)    a. Head CT 6/15: small chronic lacunar infarct in thalamus  . Hyperlipidemia   . OA (osteoarthritis)   . Peripheral neuropathy ([place])   . Pneumonia   . Pulmonary HTN    a. PASP on Echo in 9/12:  59 mmHg  . Renal insufficiency   . Sigmoid diverticulosis    s/p sigmoid colectomy  . Stroke ([place])    TIAs  . Wears dentures   . Wears glasses     Past Surgical History:  Procedure Laterality Date  . ABDOMINAL HYSTERECTOMY    . ANTERIOR CERVICAL DECOMP/DISCECTOMY FUSION  01-31-2009   C5 -- 7  . APPENDECTOMY    . Bone spurs Bilateral    Feet  . CARDIOVASCULAR STRESS TEST  02-26-2011   Normal lexiscan no exercise study/  no ischemia/  normal LV function and wall motion, ef 67%  . CARPAL TUNNEL RELEASE Right   . CATARACT EXTRACTION W/ INTRAOCULAR LENS  IMPLANT, BILATERAL Bilateral   . CHOLECYSTECTOMY    . COLOSTOMY N/A 06/02/2014   Procedure: DIVERTING DESCENDING END COLOSTOMY;  Surgeon: Donnie Mesa, MD;  Location: [place];  Service: [name];  Laterality: N/A;  . CORONARY ANGIOPLASTY WITH STENT PLACEMENT  11-04-2007  dr bensimhon   BMS x2 to RCA/  mild Non-obstructive disease LAD/  normal LVF  . DILATION AND CURETTAGE OF UTERUS    . EVALUATION UNDER ANESTHESIA WITH ANAL FISTULECTOMY N/A 10/13/2014   Procedure: ANAL EXAM UNDER  ANESTHESIA ;  Surgeon: [name], MD;  Location: [facility];  Service: [name];  Laterality: N/A;  . HAND SURGERY     Tendon repair  . LAPAROSCOPIC SIGMOID COLECTOMY N/A 11/24/2014   Procedure: SIGMOID COLECTOMY AND COLSOTOMY CLOSURE;  Surgeon: Donnie Mesa, MD;  Location: [place];  Service: [name];  Laterality: N/A;  . LUMBAR LAMINECTOMY  10-29-2002,  1969   Left  L3 -- 4  decompression  . PARTIAL COLECTOMY N/A 06/02/2014   Procedure: RIGHT PARTIAL COLECTOMY ;  Surgeon:  Donnie Mesa, MD;  Location: [place];  Service: [name];  Laterality: N/A;  . PATELLECTOMY Right 09/14/2013   Procedure: PATELLECTOMY;  Surgeon: Meredith Pel, MD;  Location: [place];  Service: Orthopedics;  Laterality: Right;  . PROCTOSCOPY N/A 10/13/2014   Procedure: RIDGE PROCTOSCOPY;  Surgeon: [name], MD;  Location[facility];  Service: [name];  Laterality: N/A;  . REVISION TOTAL KNEE ARTHROPLASTY Bilateral right  04-02-2011/  left 1996 & 10-05-1999  . ROTATOR CUFF REPAIR Bilateral   . TONSILLECTOMY    . TOTAL KNEE ARTHROPLASTY Bilateral left 1994/  right 2000  . TOTAL KNEE REVISION Left 08/02/2015   Procedure: LEFT FEMORAL REVISION;  Surgeon: Gaynelle Arabian, MD;  Location: [facility];  Service: Orthopedics;  Laterality: Left;  . TRANSTHORACIC ECHOCARDIOGRAM  12-01-2009   Grade I diastolic dysfunction/  ef 60%/  moderate MR/  mild TR    Current Medications: Outpatient Medications Prior to Visit  Medication Sig Dispense Refill  . acetaminophen (TYLENOL) 325 MG tablet Take 650 mg by mouth every 6 (six) hours as needed for moderate pain.     Marland Kitchen apixaban (ELIQUIS) 5 MG TABS tablet Take 1 tablet (5 mg total) by mouth 2 (two) times daily. 180 tablet 3  . benazepril (LOTENSIN) 40 MG tablet Take 40 mg by mouth daily.    Marland Kitchen diltiazem (CARDIZEM) 30 MG tablet Take 1 tablet (30 mg total) by mouth every 8 (eight) hours. 90 tablet 6  . esomeprazole (NEXIUM) 20 MG capsule Take 20 mg by mouth daily at 12 noon.    . ferrous sulfate 325  (65 FE) MG tablet Take 1 tablet (325 mg total) by mouth 2 (two) times daily with a meal.  3  . furosemide (LASIX) 20 MG tablet Take 20 mg by mouth 3 (three) times a week. (Mondays, Wednesdays, and Fridays)    . gabapentin (NEURONTIN) 600 MG tablet Take 600 mg by mouth 3 (three) times daily.    Marland Kitchen HYDROcodone-acetaminophen (NORCO/VICODIN) 5-325 MG tablet Take 1 tablet by mouth every 6 (six) hours as needed for moderate pain. 60 tablet 0  . LORazepam (ATIVAN) 0.5 MG tablet TAKE ONE TABLET BY MOUTH TWICE DAILY AS NEEDED FOR ANXIETY OR SLEEP 60 tablet 5  . metoprolol (LOPRESSOR) 100 MG tablet Take 1 tablet (100 mg total) by mouth 2 (two) times daily. 180 tablet 3  . nitroGLYCERIN (NITROSTAT) 0.4 MG SL tablet Place 1 tablet (0.4 mg total) under the tongue every 5 (five) minutes as needed for chest pain (x 3 pills). Reported on 09/01/2015 25 tablet 3  . Polyethyl Glycol-Propyl Glycol (SYSTANE OP) Place 1 drop into both eyes 2 (two) times daily as needed (for dry eyes).    . promethazine (PHENERGAN) 12.5 MG tablet TAKE 1 TABLET (12.5 MG TOTAL) BY MOUTH EVERY 8 HOURS AS NEEDED FOR NAUSEA OR VOMITING. 20 tablet 1  . sertraline (ZOLOFT) 50 MG tablet Take 50 mg by mouth daily.    . traMADol (ULTRAM) 50 MG tablet Take 50 mg by mouth daily as needed for moderate pain.    . traZODone (DESYREL) 100 MG tablet Take 50 mg by mouth at bedtime.    . triamcinolone cream (KENALOG) 0.1 % Apply 1 application topically 2 (two) times daily as needed (Eczema on legs). Reported on 07/25/2015    . isosorbide mononitrate (IMDUR) 30 MG 24 hr tablet Take 1 tablet (30 mg total) by mouth daily. 90 tablet 1  . methocarbamol (ROBAXIN) 500 MG tablet Take 1 tablet (500 mg  total) by mouth every 6 (six) hours as needed for muscle spasms. (Patient not taking: Reported on 08/05/2016) 80 tablet 0   No facility-administered medications prior to visit.      Allergies:   Patient has no known allergies.   Social History   Social History  .  Marital status: Married    Spouse name: N/A  . Number of children: N/A  . Years of education: N/A   Social History Main Topics  . Smoking status: Former Smoker    Packs/day: 0.50    Years: 15.00    Types: Cigarettes    Quit date: 07/16/1967  . Smokeless tobacco: Never Used  . Alcohol use No  . Drug use: No  . Sexual activity: Not Asked   Other Topics Concern  . None   Social History Narrative   Married, 2 children.  As of November 2015 her husband has been living in a nursing home for tendon after years as he suffers from multiple medical problems.   Patient does not drink alcohol nor does she smoke or chew tobacco products   Right handed   8th grade   1 cup daily     Family History:  The patient's family history includes Cancer (age of onset: 77) in her sister; Colon cancer (age of onset: 71) in her maternal uncle; Heart Problems in her father; Heart failure in her mother; Kidney cancer (age of onset: 85) in her brother; Osteoarthritis in her mother.   ROS:   Please see the history of present illness.    Review of Systems  Constitution: Positive for malaise/fatigue.  HENT: Negative.   Eyes: Negative.   Cardiovascular: Positive for dyspnea on exertion and leg swelling. Negative for chest pain and palpitations.  Respiratory: Negative.   Hematologic/Lymphatic: Negative.   Musculoskeletal: Negative.  Negative for joint pain.  Gastrointestinal: Negative.   Genitourinary: Negative.   Neurological: Positive for headaches.   All other systems reviewed and are negative.   PHYSICAL EXAM:   VS:  BP 136/84 (BP Location: Right Arm, Patient Position: Sitting, Cuff Size: Large)   [name] 83   Ht 5\' 3"  (1.6 m)   Wt 190 lb 12.8 oz (86.5 kg)   SpO2 95%   BMI 33.80 kg/m   Physical Exam  GEN: Well nourished, well developed, in no acute distress  Neck: no JVD, carotid bruits, or masses Cardiac:irreg irreg with 2/6 systolic murmur LSB,no rubs, or gallops  Respiratory:  clear to  auscultation bilaterally, normal work of breathing GI: soft, nontender, nondistended, + BS Ext: without cyanosis, clubbing, or edema, Good distal pulses bilaterally MS: no deformity or atrophy  Skin: warm and dry, no rash Psych: euthymic mood, full affect  Wt Readings from Last 3 Encounters:  08/05/16 190 lb 12.8 oz (86.5 kg)  07/17/16 191 lb (86.6 kg)  06/18/16 190 lb (86.2 kg)      Studies/Labs Reviewed:   EKG:  EKG is not ordered today.   Recent Labs: 06/17/2016: ALT 30; BUN 20.9; Creatinine 1.2; Potassium 4.6; Sodium 142 07/17/2016: Hemoglobin 10.6; Platelets 157.0   Lipid Panel    Component Value Date/Time   CHOL 130 01/19/2013 1014   TRIG 87.0 01/19/2013 1014   HDL 33.60 (L) 01/19/2013 1014   CHOLHDL 4 01/19/2013 1014   VLDL 17.4 01/19/2013 1014   LDLCALC 79 01/19/2013 1014    Additional studies/ records that were reviewed today include:  CT 03/06/16: IMPRESSION: 1. Stable remote surgical changes from a partial right hemicolectomy. No  CT findings for recurrent tumor or metastatic disease. 2. Stable atherosclerotic calcifications involving the thoracic aorta, abdominal aorta and branch vessels including dense 3 vessel coronary artery calcifications. 3. Moderate to large hiatal hernia. 4. Scoliosis and degenerative changes involving the spine. 5. No acute findings in the chest, abdomen or pelvis.     Electronically Signed   By: Marijo Sanes M.D.   On: 03/06/2016 09:42   2Decho 07/27/15:  Study Conclusions   - Left ventricle: The cavity size was normal. Wall thickness was   increased in a pattern of mild LVH. Systolic function was normal.   The estimated ejection fraction was in the range of 50% to 55%.   Wall motion was normal; there were no regional wall motion   abnormalities. - Mitral valve: There was moderate regurgitation. - Left atrium: The atrium was severely dilated. - Right atrium: The atrium was mildly dilated. - Pulmonary arteries: Systolic  pressure was mildly increased. PA   peak pressure: 44 mm Hg (S).   Impressions:   - Low normal LV systolic function; severe LAE; mild RAE; moderate   MR; mild TR; mildly elevated pulmonary pressure.      48 hr holter 05/10/16 Study Highlights  Atrial fibrillation with controlled heart rates. Highest heart rate 128 bpm Premature ventricular contractions No long pauses.    No changes in therapy.         Assessment / Plan:    1.  Persistent atrial fibrillation ;  She has chronic atrial fibrillation. She is on Eliquis.  She's tolerating this fairly well.   2  Essential hypertension controlled  3. CAD:   She has rare episodes of chest pain.  4. Moderate pulmonary hypertension: She has a stated PA pressures of 44. She takes Lasix 3 times a week. She has tried taking Lasix or frequency but this caused worsening of her CK D. I've given her the okay to try taking Lasix several additional times per week if she has worsening shortness of breath.  5.  Diastolic heart failure:   compensated    Medication Adjustments/Labs and Tests Ordered: Current medicines are reviewed at length with the patient today.  Concerns regarding medicines are outlined above.  Medication changes, Labs and Tests ordered today are listed in the Patient Instructions below. Patient Instructions  Medication Instructions:    Your physician recommends that you continue on your current medications as directed. Please refer to the Current Medication list given to you today.  --- If you need a refill on your cardiac medications before your next appointment, please call your pharmacy. ---  Labwork:  None ordered  Testing/Procedures:  None ordered  Follow-Up:  Your physician wants you to follow-up in: 6 months with Dr. Acie Fredrickson.  You will receive a reminder letter in the mail two months in advance. If you don't receive a letter, please call our office to schedule the follow-up appointment.  Thank you for  choosing [facility]!!                 Mertie Moores, MD  08/05/2016 10:14 AM    [name] [place], [place],   [address] Phone: [phone]; Fax: [phone]

## 2016-09-06 ENCOUNTER — Telehealth: Payer: Self-pay | Admitting: Hematology

## 2016-09-06 NOTE — Telephone Encounter (Signed)
Wants to change lab appointment on 3/1 to 2/27 in the morning if possible   [phone]

## 2016-09-10 ENCOUNTER — Other Ambulatory Visit (HOSPITAL_BASED_OUTPATIENT_CLINIC_OR_DEPARTMENT_OTHER): Payer: PPO

## 2016-09-10 DIAGNOSIS — C182 Malignant neoplasm of ascending colon: Secondary | ICD-10-CM | POA: Diagnosis not present

## 2016-09-10 LAB — COMPREHENSIVE METABOLIC PANEL
ALT: 21 U/L (ref 0–55)
AST: 28 U/L (ref 5–34)
Albumin: 4.1 g/dL (ref 3.5–5.0)
Alkaline Phosphatase: 72 U/L (ref 40–150)
Anion Gap: 8 mEq/L (ref 3–11)
BUN: 33 mg/dL — ABNORMAL HIGH (ref 7.0–26.0)
CO2: 28 mEq/L (ref 22–29)
Calcium: 9.7 mg/dL (ref 8.4–10.4)
Chloride: 105 mEq/L (ref 98–109)
Creatinine: 1.5 mg/dL — ABNORMAL HIGH (ref 0.6–1.1)
EGFR: 32 mL/min/{1.73_m2} — ABNORMAL LOW (ref >90)
Glucose: 97 mg/dl (ref 70–140)
Potassium: 5.4 mEq/L — ABNORMAL HIGH (ref 3.5–5.1)
Sodium: 141 mEq/L (ref 136–145)
Total Bilirubin: 0.41 mg/dL (ref 0.20–1.20)
Total Protein: 7.6 g/dL (ref 6.4–8.3)

## 2016-09-10 LAB — CBC WITH DIFFERENTIAL/PLATELET
BASO%: 0.2 % (ref 0.0–2.0)
Basophils Absolute: 0 10*3/uL (ref 0.0–0.1)
EOS%: 1.3 % (ref 0.0–7.0)
Eosinophils Absolute: 0.1 10*3/uL (ref 0.0–0.5)
HCT: 35.5 % (ref 34.8–46.6)
HGB: 10.5 g/dL — ABNORMAL LOW (ref 11.6–15.9)
LYMPH%: 20 % (ref 14.0–49.7)
MCH: 24.5 pg — ABNORMAL LOW (ref 25.1–34.0)
MCHC: 29.6 g/dL — ABNORMAL LOW (ref 31.5–36.0)
MCV: 82.8 fL (ref 79.5–101.0)
MONO#: 0.5 10*3/uL (ref 0.1–0.9)
MONO%: 8.8 % (ref 0.0–14.0)
NEUT#: 4.3 10*3/uL (ref 1.5–6.5)
NEUT%: 69.7 % (ref 38.4–76.8)
Platelets: 150 10*3/uL (ref 145–400)
RBC: 4.29 10*6/uL (ref 3.70–5.45)
RDW: 16.4 % — ABNORMAL HIGH (ref 11.2–14.5)
WBC: 6.2 10*3/uL (ref 3.9–10.3)
lymph#: 1.2 10*3/uL (ref 0.9–3.3)

## 2016-09-10 LAB — CEA (IN HOUSE-CHCC): CEA (CHCC-In House): 3.26 ng/mL (ref 0.00–5.00)

## 2016-09-12 ENCOUNTER — Encounter (HOSPITAL_COMMUNITY): Payer: Self-pay

## 2016-09-12 ENCOUNTER — Ambulatory Visit (HOSPITAL_COMMUNITY)
Admission: RE | Admit: 2016-09-12 | Discharge: 2016-09-12 | Disposition: A | Discharge status: Home / Self Care | Payer: PPO | Source: Ambulatory Visit | Attending: Hematology | Admitting: Hematology

## 2016-09-12 ENCOUNTER — Other Ambulatory Visit: Payer: Commercial Managed Care - HMO

## 2016-09-12 DIAGNOSIS — C182 Malignant neoplasm of ascending colon: Secondary | ICD-10-CM | POA: Insufficient documentation

## 2016-09-12 DIAGNOSIS — I2510 Atherosclerotic heart disease of native coronary artery without angina pectoris: Secondary | ICD-10-CM | POA: Diagnosis not present

## 2016-09-12 DIAGNOSIS — R0602 Shortness of breath: Secondary | ICD-10-CM | POA: Diagnosis not present

## 2016-09-12 DIAGNOSIS — I700 Atherosclerosis of aorta: Secondary | ICD-10-CM | POA: Diagnosis not present

## 2016-09-12 DIAGNOSIS — R1031 Right lower quadrant pain: Secondary | ICD-10-CM | POA: Diagnosis not present

## 2016-09-12 DIAGNOSIS — K449 Diaphragmatic hernia without obstruction or gangrene: Secondary | ICD-10-CM | POA: Insufficient documentation

## 2016-09-12 DIAGNOSIS — I517 Cardiomegaly: Secondary | ICD-10-CM | POA: Diagnosis not present

## 2016-09-12 MED ORDER — IOPAMIDOL (ISOVUE-300) INJECTION 61%
75.0000 mL | Freq: Once | INTRAVENOUS | Status: AC | PRN
  Administered 2016-09-12: 75 mL via INTRAVENOUS

## 2016-09-12 MED ORDER — IOPAMIDOL (ISOVUE-300) INJECTION 61%
INTRAVENOUS | Status: AC
  Filled 2016-09-12: qty 75

## 2016-09-12 NOTE — Progress Notes (Signed)
[facility]  Telephone:([phone]) [phone] Fax:([phone]) [phone]  Clinic Follow Up Note   Patient Care Team: Gordy Savers, MD as PCP - General (Internal Medicine) 09/16/2016  CHIEF COMPLAINTS:  Follow up multifocal colon cancer  Oncology History   Cancer of ascending colon [facility])   Staging form: Colon and Rectum, AJCC 7th Edition   - Clinical: Stage IIIB (T4a, N1, M0) - Unsigned      Cancer of ascending colon (HCC)   06/02/2014 Initial Diagnosis    Adenocarcinoma, colon. she presented with anemia, nauseam anorexia and abdominal pain and [facility].      06/02/2014 Surgery    exploratory laparotomy, right hemicolectomy, and descending colostomy by Dr. Corliss Skains      06/02/2014 Pathologic Stage    multifocal (2) colon cancer at  ileocecal valve and ascending colon.,  mT4N1Mx, at least stage IIIB, grade 1, MMR normal, surgical margins negative.       06/02/2014 Tumor Marker    CEA 1.7      11/24/2014 Surgery    SIGMOID COLECTOMY AND COLSOTOMY CLOSURE with Manus Rudd      03/06/2016 Imaging    CT chest, abdomen and pelvis without contrast showed stable remote surgical changes, no evidence of metastatic disease. Stable atherosclerotic calcifications.      09/12/2016 Imaging    CT CAP W/ CONTRAST 09/12/2016 IMPRESSION: 1. Status post partial right hemicolectomy, without evidence to suggest local recurrence of disease or metastatic disease to the chest, abdomen or pelvis. 2. Cardiomegaly with biatrial dilatation. 3. Aortic atherosclerosis, in addition to left main and 3 vessel coronary artery disease. 4. Moderate to large hiatal hernia.        HISTORY OF PRESENTING ILLNESS:  [name] 83 y.o. female is here because of recently diagnosed and resected multifocal colon cancer. I saw him when she was admitted to the hospital, and she is here with her family members for follow-up.  She has a history of chronic anemia and colovaginal  fistula as well as diverticulosis with colonic polyps since 2008, was admitted to [facility] on 05/31/2014 With progressive abdominal pain, worse over the last couple of weeks, accompanied by nausea and vomiting, without weight loss. Workup included a CT of the abdomen and pelvis with contrast, which revealed acute small bowel obstruction due to what appears to be a soft tissue mass (apple core lesion) at the ileocecal valve suspicious for intestinal adenocarcinoma.This mass measures 6 x 4 cm. Up to 8 mm stable pericecal lymph nodes were visualized. No liver or distant metastasis identified. Small volume free fluid in the abdomen and pelvis, Likely reactive, was seen. No free air.  She underwent exploratory laparotomy, right hemicolectomy, and descending colostomy by Dr. Corliss Skains on 06/02/2014. Grossly, there are two tumors identified. The larger tumor is located at the ileocecal valve and consists of an 8.5 cm span of moderately differentiated adenocarcinoma that involves the serosa. This tumor has associated perineural and lymphovascular invasion. The second discontiguous tumor is present in the proximal right colon and spans 4.0 cm and consists of well differentiated adenocarcinoma that is morphologically dissimilar from the larger tumor. This tumor extends into the muscularis propria.Her CEA is 1.7. It is stage mT4, N1b. immunohistochemical stains are normal, with very low probability of microsatellite instability (See details below ).  She was discharged to rehabilitation after her surgery. She has been doing well at to rehabilitation. She had a surgical wound infection a few weeks after surgery,  which was treated with oral antibiotics and wound care. She was not very physically active before the surgery due to her multiple arthritis, she is recovering well at the nursing home and is likely going home in a few days. She is able to move around with a walker, has good appetite, surgical site pain is  controlled, no nausea or other complaints. She did developed some UTI symptoms, and started Cipro and probiotics yesterday at the nursing home.  CURRENT THERAPY: Surveillance  INTERIM HISTORY She returns for follow up, she is accompanied by her daughter to the clinic today. She reports she is well. She complains of right sided flank pain and leg cramps. The flank pain can get bad at times. She would need to sit and stop moving to make the pain subside. She takes Norco, at least once a week when her pain is bad, but she mostly takes Tylenol for the pain. She complains of fatigue, bilateral knee pain, and right hip pain. She lives by herself. CT CAP w/ contrast on 09/12/16 showed no evidence of recurrent disease. Reports her bowels are fine.  MEDICAL HISTORY:  Past Medical History:  Diagnosis Date  . Adenocarcinoma, colon [facility]) Oncologist-- Dr Truitt Merle   Multifocal (2) Colon cancer @ ileocecal valve and ascending ( mT4N1Mx), Stage IIIB, grade I, MMR normal , 2 of 16 +lymph nodes, negative surgical margins---  06-02-2014  Right hemicolectomy w/ colostomy  . Allergy   . Anxiety   . CAD (coronary artery disease)    a. LHC 4/09: pLAD 40, mDx 70-75, pLCx 30, mLCx 50, mRCA 90, EF 60% >> PCI: BMS x2 to RCA;  b. Myoview 8/12: normal  . Cataract    bil cateracts removed  . Chronic anemia   . Chronic diastolic CHF (congestive heart failure) (HCC)    a. Echo 912 - Mild LVH, EF 55-60%, no RWMA, Gr 1 DD, mild MR, mild LAE, mild RAE, PASP 59 mmHg (mod to severe pulmo HTN)  . Colostomy in place [facility])    s/p colostomy takedown 5/16  . Colovaginal fistula    s/p colostomy >> colostomy takedown 5/16  . COPD with chronic bronchitis ([place])   . Depression   . Dysrhythmia    A fib  . Essential hypertension   . GERD (gastroesophageal reflux disease)   . History of adenomatous polyp of colon   . History of blood transfusion   . History of DVT of lower extremity   . History of hiatal hernia   . History of TIA  (transient ischemic attack)    a. Head CT 6/15: small chronic lacunar infarct in thalamus  . Hyperlipidemia   . OA (osteoarthritis)   . Peripheral neuropathy ([place])   . Pneumonia   . Pulmonary HTN    a. PASP on Echo in 9/12:  59 mmHg  . Renal insufficiency   . Sigmoid diverticulosis    s/p sigmoid colectomy  . Stroke ([place])    TIAs  . Wears dentures   . Wears glasses     SURGICAL HISTORY: Past Surgical History:  Procedure Laterality Date  . ABDOMINAL HYSTERECTOMY    . ANTERIOR CERVICAL DECOMP/DISCECTOMY FUSION  01-31-2009   C5 -- 7  . APPENDECTOMY    . Bone spurs Bilateral    Feet  . CARDIOVASCULAR STRESS TEST  02-26-2011   Normal lexiscan no exercise study/  no ischemia/  normal LV function and wall motion, ef 67%  . CARPAL TUNNEL RELEASE Right   .  CATARACT EXTRACTION W/ INTRAOCULAR LENS  IMPLANT, BILATERAL Bilateral   . CHOLECYSTECTOMY    . COLOSTOMY N/A 06/02/2014   Procedure: DIVERTING DESCENDING END COLOSTOMY;  Surgeon: Donnie Mesa, MD;  Location: [facility];  Service: General;  Laterality: N/A;  . CORONARY ANGIOPLASTY WITH STENT PLACEMENT  11-04-2007  dr bensimhon   BMS x2 to RCA/  mild Non-obstructive disease LAD/  normal LVF  . DILATION AND CURETTAGE OF UTERUS    . EVALUATION UNDER ANESTHESIA WITH ANAL FISTULECTOMY N/A 10/13/2014   Procedure: ANAL EXAM UNDER ANESTHESIA ;  Surgeon: [name], MD;  Location: [facility];  Service: General;  Laterality: N/A;  . HAND SURGERY     Tendon repair  . LAPAROSCOPIC SIGMOID COLECTOMY N/A 11/24/2014   Procedure: SIGMOID COLECTOMY AND COLSOTOMY CLOSURE;  Surgeon: Donnie Mesa, MD;  Location: [place];  Service: General;  Laterality: N/A;  . LUMBAR LAMINECTOMY  10-29-2002,  1969   Left  L3 -- 4  decompression  . PARTIAL COLECTOMY N/A 06/02/2014   Procedure: RIGHT PARTIAL COLECTOMY ;  Surgeon: Donnie Mesa, MD;  Location: [place];  Service: General;  Laterality: N/A;  . PATELLECTOMY Right 09/14/2013   Procedure: PATELLECTOMY;  Surgeon:  Meredith Pel, MD;  Location: [place];  Service: Orthopedics;  Laterality: Right;  . PROCTOSCOPY N/A 10/13/2014   Procedure: RIDGE PROCTOSCOPY;  Surgeon: [name], MD;  Location: [facility];  Service: General;  Laterality: N/A;  . REVISION TOTAL KNEE ARTHROPLASTY Bilateral right  04-02-2011/  left 1996 & 10-05-1999  . ROTATOR CUFF REPAIR Bilateral   . TONSILLECTOMY    . TOTAL KNEE ARTHROPLASTY Bilateral left 1994/  right 2000  . TOTAL KNEE REVISION Left 08/02/2015   Procedure: LEFT FEMORAL REVISION;  Surgeon: Gaynelle Arabian, MD;  Location: [facility];  Service: Orthopedics;  Laterality: Left;  . TRANSTHORACIC ECHOCARDIOGRAM  12-01-2009   Grade I diastolic dysfunction/  ef 60%/  moderate MR/  mild TR    SOCIAL HISTORY: Social History   Social History  . Marital status: Married    Spouse name: N/A  . Number of children: N/A  . Years of education: N/A   Occupational History  . Not on file.   Social History Main Topics  . Smoking status: Former Smoker    Packs/day: 0.50    Years: 15.00    Types: Cigarettes    Quit date: 07/16/1967  . Smokeless tobacco: Never Used  . Alcohol use No  . Drug use: No  . Sexual activity: Not on file   Other Topics Concern  . Not on file   Social History Narrative   Married, 2 children.  As of November 2015 her husband has been living in a nursing home for tendon after years as he suffers from multiple medical problems.   Patient does not drink alcohol nor does she smoke or chew tobacco products   Right handed   8th grade   1 cup daily   She worked for [employer] for 40 years.    FAMILY HISTORY: Family History  Problem Relation Age of Onset  . Osteoarthritis Mother   . Heart failure Mother   . Colon cancer Maternal Uncle 8  . Heart Problems Father   . Cancer Sister 15    ? colon cancer   . Kidney cancer Brother 65  . Esophageal cancer Neg Hx   . Rectal cancer Neg Hx   . Stomach cancer Neg Hx     ALLERGIES:  has No Known  Allergies.  MEDICATIONS:  Current Outpatient Prescriptions  Medication Sig Dispense Refill  . acetaminophen (TYLENOL) 325 MG tablet Take 650 mg by mouth every 6 (six) hours as needed for moderate pain.     Marland Kitchen apixaban (ELIQUIS) 5 MG TABS tablet Take 1 tablet (5 mg total) by mouth 2 (two) times daily. 180 tablet 3  . benazepril (LOTENSIN) 40 MG tablet Take 40 mg by mouth daily.    Marland Kitchen diltiazem (CARDIZEM) 30 MG tablet Take 1 tablet (30 mg total) by mouth every 8 (eight) hours. 90 tablet 6  . esomeprazole (NEXIUM) 20 MG capsule Take 20 mg by mouth daily at 12 noon.    . ferrous sulfate 325 (65 FE) MG tablet Take 1 tablet (325 mg total) by mouth 2 (two) times daily with a meal. (Patient taking differently: Take 325 mg by mouth daily with breakfast. )  3  . furosemide (LASIX) 20 MG tablet Take 20 mg by mouth 3 (three) times a week. (Mondays, Wednesdays, and Fridays)    . gabapentin (NEURONTIN) 600 MG tablet Take 600 mg by mouth 3 (three) times daily.    Marland Kitchen HYDROcodone-acetaminophen (NORCO/VICODIN) 5-325 MG tablet Take 1 tablet by mouth every 6 (six) hours as needed for moderate pain. 60 tablet 0  . LORazepam (ATIVAN) 0.5 MG tablet TAKE ONE TABLET BY MOUTH TWICE DAILY AS NEEDED FOR ANXIETY OR SLEEP 60 tablet 5  . metoprolol (LOPRESSOR) 100 MG tablet Take 1 tablet (100 mg total) by mouth 2 (two) times daily. 180 tablet 3  . Polyethyl Glycol-Propyl Glycol (SYSTANE OP) Place 1 drop into both eyes 2 (two) times daily as needed (for dry eyes).    . sertraline (ZOLOFT) 50 MG tablet Take 50 mg by mouth daily.    . traMADol (ULTRAM) 50 MG tablet Take 50 mg by mouth daily as needed for moderate pain.    . traZODone (DESYREL) 100 MG tablet Take 50 mg by mouth at bedtime.    . triamcinolone cream (KENALOG) 0.1 % Apply 1 application topically 2 (two) times daily as needed (Eczema on legs). Reported on 07/25/2015    . methocarbamol (ROBAXIN) 500 MG tablet Take 1 tablet (500 mg total) by mouth every 6 (six) hours as  needed for muscle spasms. (Patient not taking: Reported on 08/05/2016) 80 tablet 0  . nitroGLYCERIN (NITROSTAT) 0.4 MG SL tablet Place 1 tablet (0.4 mg total) under the tongue every 5 (five) minutes as needed for chest pain (x 3 pills). Reported on 09/01/2015 (Patient not taking: Reported on 09/16/2016) 25 tablet 3  . promethazine (PHENERGAN) 12.5 MG tablet TAKE 1 TABLET (12.5 MG TOTAL) BY MOUTH EVERY 8 HOURS AS NEEDED FOR NAUSEA OR VOMITING. (Patient not taking: Reported on 09/16/2016) 20 tablet 1   No current facility-administered medications for this visit.     REVIEW OF SYSTEMS:   Constitutional: Denies fevers, chills or abnormal night sweats (+) Fatigue Eyes: Denies blurriness of vision, double vision or watery eyes Ears, nose, mouth, throat, and face: Denies mucositis or sore throat Respiratory: Denies cough, dyspnea or wheezes Cardiovascular: Denies palpitation, chest discomfort or lower extremity swelling Gastrointestinal:  Denies nausea, heartburn or change in bowel habits Skin: Denies abnormal skin rashes Lymphatics: Denies new lymphadenopathy or easy bruising Neurological:Denies numbness, tingling or new weaknesses Behavioral/Psych: Mood is stable, no new changes  MSK: (+) Right flank pain, bilateral knee pain, right hip pain. All other systems were reviewed with the patient and are negative.  PHYSICAL EXAMINATION: ECOG PERFORMANCE STATUS: 2  Vitals:   09/16/16 0942  BP: 138/81  Pulse: 91  Resp: 18  Temp: 98 F (36.7 C)   Filed Weights   09/16/16 0942  Weight: 189 lb 4.8 oz (85.9 kg)    GENERAL:alert, no distress and comfortable SKIN: skin color, texture, turgor are normal, no rashes or significant lesions EYES: normal, conjunctiva are pink and non-injected, sclera clear OROPHARYNX:no exudate, no erythema and lips, buccal mucosa, and tongue normal  NECK: supple, thyroid normal size, non-tender, without nodularity LYMPH:  no palpable lymphadenopathy in the cervical,  axillary or inguinal LUNGS: clear to auscultation and percussion with normal breathing effort HEART: regular rate & rhythm and no murmurs and no lower extremity edema ABDOMEN:abdomen soft, the midline surgical wound is well healed, appears clean. (+) Mild diffuse tenderness at RLQ, no hepatomegaly, normal bowel sounds. Musculoskeletal:no cyanosis of digits and no clubbing (+) Ambulatory with a walker. PSYCH: alert & oriented x 3 with fluent speech NEURO: no focal motor/sensory deficits.   LABORATORY DATA:  I have reviewed the data as listed CBC Latest Ref Rng & Units 09/10/2016 07/17/2016 06/17/2016  WBC 3.9 - 10.3 10e3/uL 6.2 6.7 6.3  Hemoglobin 11.6 - 15.9 g/dL 10.5(L) 10.6(L) 11.5(L)  Hematocrit 34.8 - 46.6 % 35.5 32.5(L) 37.6  Platelets 145 - 400 10e3/uL 150 157.0 156    CMP Latest Ref Rng & Units 09/10/2016 06/17/2016 03/01/2016  Glucose 70 - 140 mg/dl 97 83 105  BUN 7.0 - 26.0 mg/dL 33.0(H) 20.9 32.5(H)  Creatinine 0.6 - 1.1 mg/dL 1.5(H) 1.2(H) 1.3(H)  Sodium 136 - 145 mEq/L 141 142 140  Potassium 3.5 - 5.1 mEq/L 5.4(H) 4.6 4.7  Chloride 98 - 110 mmol/L - - -  CO2 22 - 29 mEq/L '28 28 25  '$ Calcium 8.4 - 10.4 mg/dL 9.7 9.5 9.4  Total Protein 6.4 - 8.3 g/dL 7.6 7.6 7.6  Total Bilirubin 0.20 - 1.20 mg/dL 0.41 0.48 0.36  Alkaline Phos 40 - 150 U/L 72 82 79  AST 5 - 34 U/L 28 34 23  ALT 0 - 55 U/L '21 30 19   '$ Results for RANDEE, HUSTON (MRN [id number]) as of 09/16/2016 10:06  Ref. Range 03/01/2016 08:50 06/17/2016 09:50 09/10/2016 08:54  CEA Latest Ref Range: 0.0 - 4.7 ng/mL 3.1 2.6   CEA ([facility]) Latest Ref Range: 0.00 - 5.00 ng/mL 3.05 2.91 3.26   PATHOLOGY  Colon, segmental resection for tumor, right 06/02/2014 - MULTIFOCAL INVASIVE ADENOCARCINOMA, SPANNING 8.5 CM AND 4.0 CM. - ADENOCARCINOMA INVOLVES THE SEROSA. - LYMPHOVASCULAR INVASION IS IDENTIFIED. 1 of 4 FINAL for Salzman, Quintara C ([id number]) Diagnosis(continued) - PERINEURAL INVASION IS IDENTIFIED. - METASTATIC  ADENOCARCINOMA IN 2 OF 16 LYMPH NODES (2/16) WITH EXTRACAPSULAR EXTENSION. - THE SURGICAL RESECTION MARGINS ARE NEGATIVE FOR ADENOCARCINOMA. - SEE ONCOLOGY TABLE BELOW. Microscopic Comment COLON AND RECTUM (INCLUDING TRANS-ANAL RESECTION): Specimen: Terminal ileum and right colon Procedure: Resection Tumor site: Ileocecal valve (tumor #1) and proximal mid ascending colon (tumor #2) Specimen integrity: Intact Macroscopic intactness of mesorectum: N/A Macroscopic tumor perforation: Not identified Invasive tumor: Tumor #1 (ileocecal valve) Maximum size: 8.5 cm (gross measurement) Histologic type(s): Adenocarcinoma Histologic grade and differentiation: G2: moderately differentiated/low grade Type of polyp in which invasive carcinoma arose: Likely tubular adenoma Microscopic extension of invasive tumor: Adenocarcinoma involves the serosa Invasive tumor: Tumor #2 (proximal mid ascending colon) Maximum size: 4.0 cm (gross measurement) Histologic type(s): Adenocarcinoma Histologic grade and differentiation: G1: well differentiated/low grade Type of polyp in which invasive carcinoma arose: Tubulovillous adenoma Microscopic  extension of invasive tumor: Adenocarcinoma extends into the muscularis propria Lymph-Vascular invasion: Present Peri-neural invasion: Present Tumor deposit(s) (discontinuous extramural extension): Not identified Resection margins: Proximal margin: 32.0 cm Distal margin: 7.5 cm Circumferential (radial) (posterior ascending, posterior descending; lateral and posterior mid-rectum; and entire lower 1/3 rectum): 3.0 cm Treatment effect (neo-adjuvant therapy): N/A Additional polyp(s): Not identified Non-neoplastic findings: No significant findings Lymph nodes: number examined 16; number positive: 2 Pathologic Staging: mT4, N1b Ancillary studies: MSI testing will be performed on the larger higher grade tumor and the results reported separately. Additional studies can be  performed upon clinician request. Comments: Grossly, there are two tumors identified. The larger tumor is located at the ileocecal valve and consists of an 8.5 cm span of moderately differentiated adenocarcinoma that involves the serosa. This tumor has associated perineural and lymphovascular invasion. The second discontiguous tumor is present in the proximal right colon and spans 4.0 cm and consists of well differentiated adenocarcinoma that is morphologically dissimilar from the larger tumor. This tumor extends into the muscularis propria. Enid Cutter MD Pathologist, Electronic Signature (Case signed 06/06/2014) Specimen Gross and Clinical Information  RADIOGRAPHIC STUDIES: I have personally reviewed the radiological images as listed and agreed with the findings in the report.  CT CAP W/ CONTRAST 09/12/2016 IMPRESSION: 1. Status post partial right hemicolectomy, without evidence to suggest local recurrence of disease or metastatic disease to the chest, abdomen or pelvis. 2. Cardiomegaly with biatrial dilatation. 3. Aortic atherosclerosis, in addition to left main and 3 vessel coronary artery disease. 4. Moderate to large hiatal hernia.  Ct abdomen and pelvis with contrast 03/06/2016 IMPRESSION: 1. Stable remote surgical changes from a partial right hemicolectomy. No CT findings for recurrent tumor or metastatic disease. 2. Stable atherosclerotic calcifications involving the thoracic aorta, abdominal aorta and branch vessels including dense 3 vessel coronary artery calcifications. 3. Moderate to large hiatal hernia. 4. Scoliosis and degenerative changes involving the spine. 5. No acute findings in the chest, abdomen or pelvis.   ASSESSMENT & PLAN:  83 y.o. Caucasian female, with multiple medical comorbidities, presented with anemia, abdominal pain and nausea. She underwent exploratory laparotomy, right hemicolectomy, and descending colostomy by Dr. Georgette Dover on 06/02/2014. Surgical path  reviewed multifocal (2) colon cancer at  ileocecal valve and ascending colon.  1. Multifocal invasive colon adenocarcinoma, mT4N1Mx, at least stage IIIB, grade 1, MMR normal. -I previously reviewed her surgical findings and her CT of abdomen and pelvis with patient and her extended family members in great detail today. Both of her colon cancer were completely surgically resected, however she does have high risk of cancer recurrence in the future due to locally advanced stage.  -Her surveillance CT scan from 03/06/2016 showed no evidence of recurrence, I discussed the scan findings with patient and her daughter. -I reviewed her lab today. Her CBC and CMP are unremarkable except mild anemia which has been new for the past 3 months,  CEA has been normal. -Her clinical exam was unremarkable except mild right abd tenderness, which is not new  -She attempted colonoscopy, but procedure was aborted due to her high blood pressure and complicated cardiac history.  -Due to her newly developed anemia and persistent abdominal pain, I encouraged her to try colonoscopy again with Dr. Henrene Pastor. Her blood pressure has been well-controlled lately. -Due to her high risk recurrence, I obtained a CT scan on 09/12/16. This showed no evidence to suggest local recurrence of disease or metastatic disease to the chest, abdomen or pelvis, cardiomegaly with biatrial dilatation,  aortic atherosclerosis in addition to left main and 3 vessel coronary artery disease, and a moderate to large hiatal hernia. -We'll continue surveillance. Repeat CT in one year if she is clinically doing well   2. Genetics -Due to her family history of colon cancer, and her daughters multifocal polyps, I recommend her to have a genetic counseling. Her daughter met our genetic counselor before and genetic testing was negative. -She declined genetic testing at this point.  3. Anemia -resolved previously but she has developed mild anemia can now -I'll repeat  her ferritin and iron study today, which showed mild low saturation  -Continue oral iron supplement.   4. CAD, CHF -She will continue follow-up with her cardiologist Dr. Kathlen Mody -I discussed the CT scan findings of atherosclerosis, which involves her aorta and 3 coronary artery vessel, she does have frequent chest pain, I strongly encouraged her to see Dr. Kathlen Mody soon. -Follow-up with her primary care physician for other medical issues.  5. Hiatal Hernia -We discussed that she should see Dr. Henrene Pastor about this.  PLAN -She will see Dr. Henrene Pastor tomorrow. I encourage her to have a repeated colonoscopy, due to her newly developed anemia  -Return to clinic in 5 months with lab.  All questions were answered. The patient knows to call the clinic with any problems, questions or concerns.  I spent 20 minutes counseling the patient face to face. The total time spent in the appointment was 25 minutes and more than 50% was on counseling.     Truitt Merle, MD 09/16/2016 11:33 AM  This document serves as a record of services personally performed by Truitt Merle, MD. It was created on her behalf by Darcus Austin, a trained medical scribe. The creation of this record is based on the scribe's personal observations and the provider's statements to them. This document has been checked and approved by the attending provider.

## 2016-09-16 ENCOUNTER — Ambulatory Visit (HOSPITAL_BASED_OUTPATIENT_CLINIC_OR_DEPARTMENT_OTHER): Payer: PPO | Admitting: Hematology

## 2016-09-16 ENCOUNTER — Telehealth: Payer: Self-pay | Admitting: Hematology

## 2016-09-16 ENCOUNTER — Encounter: Payer: Self-pay | Admitting: Hematology

## 2016-09-16 VITALS — BP 138/81 | HR 91 | Temp 98.0°F | Resp 18 | Ht 63.0 in | Wt 189.3 lb

## 2016-09-16 DIAGNOSIS — I2510 Atherosclerotic heart disease of native coronary artery without angina pectoris: Secondary | ICD-10-CM

## 2016-09-16 DIAGNOSIS — C182 Malignant neoplasm of ascending colon: Secondary | ICD-10-CM | POA: Diagnosis not present

## 2016-09-16 DIAGNOSIS — D649 Anemia, unspecified: Secondary | ICD-10-CM | POA: Diagnosis not present

## 2016-09-16 NOTE — Telephone Encounter (Signed)
Appointments scheduled per 3/5 LOS. Patient given AVS report and calendars with future scheduled appointments. °

## 2016-09-17 ENCOUNTER — Encounter: Payer: Self-pay | Admitting: Internal Medicine

## 2016-09-17 ENCOUNTER — Ambulatory Visit (INDEPENDENT_AMBULATORY_CARE_PROVIDER_SITE_OTHER): Payer: PPO | Admitting: Internal Medicine

## 2016-09-17 ENCOUNTER — Telehealth: Payer: Self-pay

## 2016-09-17 VITALS — BP 122/78 | HR 80 | Ht 63.0 in | Wt 190.0 lb

## 2016-09-17 DIAGNOSIS — D509 Iron deficiency anemia, unspecified: Secondary | ICD-10-CM | POA: Diagnosis not present

## 2016-09-17 DIAGNOSIS — Z7901 Long term (current) use of anticoagulants: Secondary | ICD-10-CM | POA: Diagnosis not present

## 2016-09-17 DIAGNOSIS — Z85038 Personal history of other malignant neoplasm of large intestine: Secondary | ICD-10-CM | POA: Diagnosis not present

## 2016-09-17 DIAGNOSIS — K219 Gastro-esophageal reflux disease without esophagitis: Secondary | ICD-10-CM | POA: Diagnosis not present

## 2016-09-17 NOTE — Progress Notes (Signed)
HISTORY OF PRESENT ILLNESS:  [name] is a 83 y.o. female with multiple medical problems as listed below who is referred by the cancer center regarding iron deficiency anemia and the need for surveillance colonoscopy. The patient has a history of GERD with associated hiatal hernia and adenomatous colon polyps for which she had undergone prior colonoscopies. She has a history of atrial fibrillation for which she is on Eliquis. Normal ejection fraction. Last evaluated in this office 07/11/2015. See that dictation. Prior history of obstructing colon cancer in the region of the ileocecal bowel for which she underwent resection. Concurrent was an active colovesical fistula for which she underwent diverging colostomy. Subsequent reversal. Was to have had colonoscopy last year but this was canceled due to atrial fibrillation with rapid ventricular response. Now stable. Has had interval knee surgery. Accompanied today by her daughter. Review of blood work from the cancer center shows recent hemoglobin of 10.5 down from her baseline of 13.5. MCV 82.8. Down from her baseline of 89.5. Evidence of iron deficiency with a saturation of 11%. No evidence of recurrent cancer based on imaging and CEA levels. She does take Nexium 20 mg daily for GERD. She needs this to control symptoms. Minimal dysphagia. She does take iron supplement.  REVIEW OF SYSTEMS:  All non-GI ROS negative except for arthritis  Past Medical History:  Diagnosis Date  . Adenocarcinoma, colon [facility]) Oncologist-- Dr Truitt Merle   Multifocal (2) Colon cancer @ ileocecal valve and ascending ( mT4N1Mx), Stage IIIB, grade I, MMR normal , 2 of 16 +lymph nodes, negative surgical margins---  06-02-2014  Right hemicolectomy w/ colostomy  . Allergy   . Anxiety   . CAD (coronary artery disease)    a. LHC 4/09: pLAD 40, mDx 70-75, pLCx 30, mLCx 50, mRCA 90, EF 60% >> PCI: BMS x2 to RCA;  b. Myoview 8/12: normal  . Cataract    bil cateracts removed  . Chronic  anemia   . Chronic diastolic CHF (congestive heart failure) (HCC)    a. Echo 912 - Mild LVH, EF 55-60%, no RWMA, Gr 1 DD, mild MR, mild LAE, mild RAE, PASP 59 mmHg (mod to severe pulmo HTN)  . Colostomy in place [facility])    s/p colostomy takedown 5/16  . Colovaginal fistula    s/p colostomy >> colostomy takedown 5/16  . COPD with chronic bronchitis ([place])   . Depression   . Dysrhythmia    A fib  . Essential hypertension   . GERD (gastroesophageal reflux disease)   . History of adenomatous polyp of colon   . History of blood transfusion   . History of DVT of lower extremity   . History of hiatal hernia   . History of TIA (transient ischemic attack)    a. Head CT 6/15: small chronic lacunar infarct in thalamus  . Hyperlipidemia   . OA (osteoarthritis)   . Peripheral neuropathy ([place])   . Pneumonia   . Pulmonary HTN    a. PASP on Echo in 9/12:  59 mmHg  . Renal insufficiency   . Sigmoid diverticulosis    s/p sigmoid colectomy  . Stroke ([place])    TIAs  . Wears dentures   . Wears glasses     Past Surgical History:  Procedure Laterality Date  . ABDOMINAL HYSTERECTOMY    . ANTERIOR CERVICAL DECOMP/DISCECTOMY FUSION  01-31-2009   C5 -- 7  . APPENDECTOMY    . Bone spurs Bilateral    Feet  .  CARDIOVASCULAR STRESS TEST  02-26-2011   Normal lexiscan no exercise study/  no ischemia/  normal LV function and wall motion, ef 67%  . CARPAL TUNNEL RELEASE Right   . CATARACT EXTRACTION W/ INTRAOCULAR LENS  IMPLANT, BILATERAL Bilateral   . CHOLECYSTECTOMY    . COLOSTOMY N/A 06/02/2014   Procedure: DIVERTING DESCENDING END COLOSTOMY;  Surgeon: Manus Rudd, MD;  Location: [facility] OR;  Service: General;  Laterality: N/A;  . CORONARY ANGIOPLASTY WITH STENT PLACEMENT  11-04-2007  dr bensimhon   BMS x2 to RCA/  mild Non-obstructive disease LAD/  normal LVF  . DILATION AND CURETTAGE OF UTERUS    . EVALUATION UNDER ANESTHESIA WITH ANAL FISTULECTOMY N/A 10/13/2014   Procedure: ANAL EXAM UNDER  ANESTHESIA ;  Surgeon: Romie Levee, MD;  Location: [facility];  Service: General;  Laterality: N/A;  . HAND SURGERY     Tendon repair  . LAPAROSCOPIC SIGMOID COLECTOMY N/A 11/24/2014   Procedure: SIGMOID COLECTOMY AND COLSOTOMY CLOSURE;  Surgeon: Manus Rudd, MD;  Location: [facility] OR;  Service: General;  Laterality: N/A;  . LUMBAR LAMINECTOMY  10-29-2002,  1969   Left  L3 -- 4  decompression  . PARTIAL COLECTOMY N/A 06/02/2014   Procedure: RIGHT PARTIAL COLECTOMY ;  Surgeon: Manus Rudd, MD;  Location: [facility];  Service: General;  Laterality: N/A;  . PATELLECTOMY Right 09/14/2013   Procedure: PATELLECTOMY;  Surgeon: Cammy Copa, MD;  Location: [facility] OR;  Service: Orthopedics;  Laterality: Right;  . PROCTOSCOPY N/A 10/13/2014   Procedure: RIDGE PROCTOSCOPY;  Surgeon: Romie Levee, MD;  Location: [facility];  Service: General;  Laterality: N/A;  . REVISION TOTAL KNEE ARTHROPLASTY Bilateral right  04-02-2011/  left 1996 & 10-05-1999  . ROTATOR CUFF REPAIR Bilateral   . TONSILLECTOMY    . TOTAL KNEE ARTHROPLASTY Bilateral left 1994/  right 2000  . TOTAL KNEE REVISION Left 08/02/2015   Procedure: LEFT FEMORAL REVISION;  Surgeon: Ollen Gross, MD;  [facility]: [facility];  Service: Orthopedics;  Laterality: Left;  . TRANSTHORACIC ECHOCARDIOGRAM  12-01-2009   Grade I diastolic dysfunction/  ef 60%/  moderate MR/  mild TR    Social History Hope C Halterman  reports that she quit smoking about 49 years ago. Her smoking use included Cigarettes. She has a 7.50 pack-year smoking history. She has never used smokeless tobacco. She reports that she does not drink alcohol or use drugs.  family history includes Cancer (age of onset: 59) in her sister; Colon cancer (age of onset: 57) in her maternal uncle; Heart Problems in her father; Heart failure in her mother; Kidney cancer (age of onset: 83) in her brother; Osteoarthritis in her mother.  No Known Allergies     PHYSICAL EXAMINATION: Vital signs: BP 122/78   Pulse  80   Ht 5\' 3"  (1.6 m)   Wt 190 lb (86.2 kg)   BMI 33.66 kg/m   Constitutional: Elderly female but generally well-appearing, no acute distress Psychiatric: alert and oriented x3, cooperative Eyes: extraocular movements intact, anicteric, conjunctiva pink Mouth: oral pharynx moist, no lesions Neck: supple no lymphadenopathy Cardiovascular: Irregular rate and rhythm, no murmur Lungs: clear to auscultation bilaterally Abdomen: soft, obese, nontender, nondistended, no obvious ascites, no peritoneal signs, normal bowel sounds, no organomegaly Rectal: Deferred until colonoscopy Extremities: no clubbing cyanosis or lower extremity edema bilaterally. Healed scar over knee from prior surgery Skin: no lesions on visible extremities Neuro: No focal deficits. Cranial nerves intact  ASSESSMENT:  #1. History of obstructing colon cancer  status post resection November 2015. Has not had surveillance colonoscopy #2. GERD with hiatal hernia. Symptoms controlled with PPI.  #3. Minimal dysphagia, rule out stricture #4. Iron deficiency anemia. Mild. On chronic anticoagulation. Rule out GI mucosal lesion #5. Multiple medical problems including chronic anticoagulation for atrial fibrillation   PLAN:  #1. Surveillance colonoscopy for history of colon cancer, and to evaluate iron deficiency anemia. Rule out neoplasia or AVMs. The patient is high-risk given her age, comorbidities, and the need to address anticoagulation therapy.The nature of the procedure, as well as the risks, benefits, and alternatives were carefully and thoroughly reviewed with the patient. Ample time for discussion and questions allowed. The patient understood, was satisfied, and agreed to proceed. #2. Upper endoscopy to evaluate iron deficiency anemia and mild dysphagia. Rule out [employer]. The patient is high risk given her age, comorbidities, and the need to address anticoagulation therapy.The nature of the procedure, as well as the  risks, benefits, and alternatives were carefully and thoroughly reviewed with the patient. Ample time for discussion and questions allowed. The patient understood, was satisfied, and agreed to proceed. #3. Hold Eliquis 3 days prior to the procedures. Confer with her cardiologist regarding the except ability. Discussed the risk benefit profile of temporarily holding anticoagulation in patient with chronic atrial fibrillation.

## 2016-09-17 NOTE — Telephone Encounter (Signed)
[name] hold her Eliquis for 2 days prior to endoscopic procedure. Restart the day after the procedure if OK with GI

## 2016-09-17 NOTE — Patient Instructions (Signed)
We will contact you in the next few weeks to schedule your colonoscopy

## 2016-09-17 NOTE — Telephone Encounter (Signed)
  09/17/2016   RE: [name] DOB: 1930-01-28 MRN: [id number]   Dear Acie Fredrickson,    We have scheduled the above patient for an endoscopic procedure. Our records show that she is on anticoagulation therapy.   Please advise as to how long the patient [name] come off her therapy of Eliquis prior to the procedure, which is scheduled for either [place] or June.  Please fax back/ or route the completed form to [facility] at [phone].   Sincerely,    Phillis Haggis

## 2016-09-18 NOTE — Telephone Encounter (Signed)
Spoke with patient's daughter and told her that per Dr. Acie Fredrickson, patient could hold her Eliquis for 2 days prior to her procedure.  We are waiting for the June schedule comes out so patient can get a morning appointment. Once her appointment with previsit is scheduled I will communicate this information to them.

## 2016-10-10 ENCOUNTER — Encounter: Payer: Self-pay | Admitting: Internal Medicine

## 2016-10-16 DIAGNOSIS — H04123 Dry eye syndrome of bilateral lacrimal glands: Secondary | ICD-10-CM | POA: Diagnosis not present

## 2016-10-16 DIAGNOSIS — H353131 Nonexudative age-related macular degeneration, bilateral, early dry stage: Secondary | ICD-10-CM | POA: Diagnosis not present

## 2016-10-16 DIAGNOSIS — Z961 Presence of intraocular lens: Secondary | ICD-10-CM | POA: Diagnosis not present

## 2016-10-16 DIAGNOSIS — H532 Diplopia: Secondary | ICD-10-CM | POA: Diagnosis not present

## 2016-10-23 ENCOUNTER — Ambulatory Visit (INDEPENDENT_AMBULATORY_CARE_PROVIDER_SITE_OTHER): Payer: PPO | Admitting: Internal Medicine

## 2016-10-23 ENCOUNTER — Encounter: Payer: Self-pay | Admitting: Internal Medicine

## 2016-10-23 VITALS — BP 122/70 | HR 80 | Temp 97.5°F | Ht 63.0 in | Wt 188.4 lb

## 2016-10-23 DIAGNOSIS — I481 Persistent atrial fibrillation: Secondary | ICD-10-CM

## 2016-10-23 DIAGNOSIS — I4819 Other persistent atrial fibrillation: Secondary | ICD-10-CM

## 2016-10-23 DIAGNOSIS — C182 Malignant neoplasm of ascending colon: Secondary | ICD-10-CM

## 2016-10-23 DIAGNOSIS — I10 Essential (primary) hypertension: Secondary | ICD-10-CM

## 2016-10-23 DIAGNOSIS — D62 Acute posthemorrhagic anemia: Secondary | ICD-10-CM

## 2016-10-23 LAB — CBC WITH DIFFERENTIAL/PLATELET
Basophils Absolute: 0 10*3/uL (ref 0.0–0.1)
Basophils Relative: 0.5 % (ref 0.0–3.0)
Eosinophils Absolute: 0.1 10*3/uL (ref 0.0–0.7)
Eosinophils Relative: 1.6 % (ref 0.0–5.0)
HCT: 32.4 % — ABNORMAL LOW (ref 36.0–46.0)
Hemoglobin: 10 g/dL — ABNORMAL LOW (ref 12.0–15.0)
Lymphocytes Relative: 22 % (ref 12.0–46.0)
Lymphs Abs: 1.2 10*3/uL (ref 0.7–4.0)
MCHC: 30.9 g/dL (ref 30.0–36.0)
MCV: 77.9 fl — ABNORMAL LOW (ref 78.0–100.0)
Monocytes Absolute: 0.6 10*3/uL (ref 0.1–1.0)
Monocytes Relative: 11.5 % (ref 3.0–12.0)
Neutro Abs: 3.5 10*3/uL (ref 1.4–7.7)
Neutrophils Relative %: 64.4 % (ref 43.0–77.0)
Platelets: 154 10*3/uL (ref 150.0–400.0)
RBC: 4.16 Mil/uL (ref 3.87–5.11)
RDW: 18.1 % — ABNORMAL HIGH (ref 11.5–15.5)
WBC: 5.5 10*3/uL (ref 4.0–10.5)

## 2016-10-23 MED ORDER — BENAZEPRIL HCL 40 MG PO TABS: 40.0000 mg | ORAL_TABLET | Freq: Every day | ORAL | 2 refills | Status: DC

## 2016-10-23 MED ORDER — GABAPENTIN 600 MG PO TABS: 600.0000 mg | ORAL_TABLET | Freq: Three times a day (TID) | ORAL | 3 refills | Status: DC

## 2016-10-23 MED ORDER — NITROGLYCERIN 0.4 MG SL SUBL: 0.4000 mg | SUBLINGUAL_TABLET | SUBLINGUAL | 3 refills | Status: DC | PRN

## 2016-10-23 MED ORDER — TRIAMCINOLONE ACETONIDE 0.1 % EX CREA: 1.0000 "application " | TOPICAL_CREAM | Freq: Two times a day (BID) | CUTANEOUS | 2 refills | Status: DC | PRN

## 2016-10-23 MED ORDER — METOPROLOL TARTRATE 100 MG PO TABS
100.0000 mg | ORAL_TABLET | Freq: Two times a day (BID) | ORAL | 3 refills | Status: DC
Start: 2016-10-23 — End: 2017-11-12

## 2016-10-23 MED ORDER — FUROSEMIDE 20 MG PO TABS: 20.0000 mg | ORAL_TABLET | ORAL | 5 refills | Status: DC

## 2016-10-23 MED ORDER — LORAZEPAM 0.5 MG PO TABS: ORAL_TABLET | ORAL | 5 refills | Status: DC

## 2016-10-23 MED ORDER — APIXABAN 5 MG PO TABS: 5.0000 mg | ORAL_TABLET | Freq: Two times a day (BID) | ORAL | 3 refills | Status: DC

## 2016-10-23 MED ORDER — PROMETHAZINE HCL 12.5 MG PO TABS: ORAL_TABLET | ORAL | 1 refills | Status: DC

## 2016-10-23 MED ORDER — HYDROCODONE-ACETAMINOPHEN 5-325 MG PO TABS: 1.0000 | ORAL_TABLET | Freq: Four times a day (QID) | ORAL | 0 refills | Status: DC | PRN

## 2016-10-23 MED ORDER — SERTRALINE HCL 50 MG PO TABS: 50.0000 mg | ORAL_TABLET | Freq: Every day | ORAL | 3 refills | Status: DC

## 2016-10-23 MED ORDER — DILTIAZEM HCL 30 MG PO TABS: 30.0000 mg | ORAL_TABLET | Freq: Three times a day (TID) | ORAL | 6 refills | Status: DC

## 2016-10-23 MED ORDER — TRAMADOL HCL 50 MG PO TABS: 50.0000 mg | ORAL_TABLET | Freq: Every day | ORAL | 0 refills | Status: DC | PRN

## 2016-10-23 MED ORDER — ESOMEPRAZOLE MAGNESIUM 20 MG PO CPDR: 20.0000 mg | DELAYED_RELEASE_CAPSULE | Freq: Every day | ORAL | 2 refills | Status: DC

## 2016-10-23 MED ORDER — TRAZODONE HCL 100 MG PO TABS: 50.0000 mg | ORAL_TABLET | Freq: Every day | ORAL | 5 refills | Status: DC

## 2016-10-23 NOTE — Progress Notes (Signed)
Pre visit review using our clinic review tool, if applicable. No additional management support is needed unless otherwise documented below in the visit note. 

## 2016-10-23 NOTE — Progress Notes (Signed)
Subjective:    Patient ID: [name], female    DOB: 08/26/1929, 83 y.o.   MRN: [id number]  HPI  83 year old patient who is seen today in follow-up.  She has hypertension and atrial fibrillation. Remains on chronic anticoagulation. She is scheduled for colonoscopy in June.  She does have a history of worsening anemia as well as prior history of colon CA. She generally feels unwell with nonspecific complaints.  Her abdominal pain seems to have improved She states that she is only able to tolerate iron once daily  Past Medical History:  Diagnosis Date  . Adenocarcinoma, colon [facility]) Oncologist-- Dr Truitt Merle   Multifocal (2) Colon cancer @ ileocecal valve and ascending ( mT4N1Mx), Stage IIIB, grade I, MMR normal , 2 of 16 +lymph nodes, negative surgical margins---  06-02-2014  Right hemicolectomy w/ colostomy  . Allergy   . Anxiety   . CAD (coronary artery disease)    a. LHC 4/09: pLAD 40, mDx 70-75, pLCx 30, mLCx 50, mRCA 90, EF 60% >> PCI: BMS x2 to RCA;  b. Myoview 8/12: normal  . Cataract    bil cateracts removed  . Chronic anemia   . Chronic diastolic CHF (congestive heart failure) (HCC)    a. Echo 912 - Mild LVH, EF 55-60%, no RWMA, Gr 1 DD, mild MR, mild LAE, mild RAE, PASP 59 mmHg (mod to severe pulmo HTN)  . Colostomy in place [facility])    s/p colostomy takedown 5/16  . Colovaginal fistula    s/p colostomy >> colostomy takedown 5/16  . COPD with chronic bronchitis ([place])   . Depression   . Dysrhythmia    A fib  . Essential hypertension   . GERD (gastroesophageal reflux disease)   . History of adenomatous polyp of colon   . History of blood transfusion   . History of DVT of lower extremity   . History of hiatal hernia   . History of TIA (transient ischemic attack)    a. Head CT 6/15: small chronic lacunar infarct in thalamus  . Hyperlipidemia   . OA (osteoarthritis)   . Peripheral neuropathy ([place])   . Pneumonia   . Pulmonary HTN    a. PASP on Echo in 9/12:  59 mmHg    . Renal insufficiency   . Sigmoid diverticulosis    s/p sigmoid colectomy  . Stroke ([place])    TIAs  . Wears dentures   . Wears glasses      Social History   Social History  . Marital status: Married    Spouse name: N/A  . Number of children: N/A  . Years of education: N/A   Occupational History  . Not on file.   Social History Main Topics  . Smoking status: Former Smoker    Packs/day: 0.50    Years: 15.00    Types: Cigarettes    Quit date: 07/16/1967  . Smokeless tobacco: Never Used  . Alcohol use No  . Drug use: No  . Sexual activity: Not on file   Other Topics Concern  . Not on file   Social History Narrative   Married, 2 children.  As of November 2015 her husband has been living in a nursing home for tendon after years as he suffers from multiple medical problems.   Patient does not drink alcohol nor does she smoke or chew tobacco products   Right handed   8th grade   1 cup daily    Past Surgical History:  Procedure Laterality Date  . ABDOMINAL HYSTERECTOMY    . ANTERIOR CERVICAL DECOMP/DISCECTOMY FUSION  01-31-2009   C5 -- 7  . APPENDECTOMY    . Bone spurs Bilateral    Feet  . CARDIOVASCULAR STRESS TEST  02-26-2011   Normal lexiscan no exercise study/  no ischemia/  normal LV function and wall motion, ef 67%  . CARPAL TUNNEL RELEASE Right   . CATARACT EXTRACTION W/ INTRAOCULAR LENS  IMPLANT, BILATERAL Bilateral   . CHOLECYSTECTOMY    . COLOSTOMY N/A 06/02/2014   Procedure: DIVERTING DESCENDING END COLOSTOMY;  Surgeon: Donnie Mesa, MD;  Location: [facility];  Service: General;  Laterality: N/A;  . CORONARY ANGIOPLASTY WITH STENT PLACEMENT  11-04-2007  dr bensimhon   BMS x2 to RCA/  mild Non-obstructive disease LAD/  normal LVF  . DILATION AND CURETTAGE OF UTERUS    . EVALUATION UNDER ANESTHESIA WITH ANAL FISTULECTOMY N/A 10/13/2014   Procedure: ANAL EXAM UNDER ANESTHESIA ;  Surgeon: [name], MD;  Location: [facility];  Service: General;  Laterality: N/A;   . HAND SURGERY     Tendon repair  . LAPAROSCOPIC SIGMOID COLECTOMY N/A 11/24/2014   Procedure: SIGMOID COLECTOMY AND COLSOTOMY CLOSURE;  Surgeon: Donnie Mesa, MD;  Location: [place] [facility];  Service: General;  Laterality: N/A;  . LUMBAR LAMINECTOMY  10-29-2002,  1969   Left  L3 -- 4  decompression  . PARTIAL COLECTOMY N/A 06/02/2014   Procedure: RIGHT PARTIAL COLECTOMY ;  Surgeon: Donnie Mesa, MD;  Location: [facility];  Service: General;  Laterality: N/A;  . PATELLECTOMY Right 09/14/2013   Procedure: PATELLECTOMY;  Surgeon: Meredith Pel, MD;  Location: [facility];  Service: Orthopedics;  Laterality: Right;  . PROCTOSCOPY N/A 10/13/2014   Procedure: RIDGE PROCTOSCOPY;  Surgeon: [name], MD;  [facility]: [facility];  Service: General;  Laterality: N/A;  . REVISION TOTAL KNEE ARTHROPLASTY Bilateral right  04-02-2011/  left 1996 & 10-05-1999  . ROTATOR CUFF REPAIR Bilateral   . TONSILLECTOMY    . TOTAL KNEE ARTHROPLASTY Bilateral left 1994/  right 2000  . TOTAL KNEE REVISION Left 08/02/2015   Procedure: LEFT FEMORAL REVISION;  Surgeon: Gaynelle Arabian, MD;  [facility];  Service: Orthopedics;  Laterality: Left;  . TRANSTHORACIC ECHOCARDIOGRAM  12-01-2009   Grade I diastolic dysfunction/  ef 60%/  moderate MR/  mild TR    Family History  Problem Relation Age of Onset  . Osteoarthritis Mother   . Heart failure Mother   . Colon cancer Maternal Uncle 93  . Heart Problems Father   . Cancer Sister 54    ? colon cancer   . Kidney cancer Brother 88  . Esophageal cancer Neg Hx   . Rectal cancer Neg Hx   . Stomach cancer Neg Hx     No Known Allergies  Current Outpatient Prescriptions on File Prior to Visit  Medication Sig Dispense Refill  . acetaminophen (TYLENOL) 325 MG tablet Take 650 mg by mouth every 6 (six) hours as needed for moderate pain.     . ferrous sulfate 325 (65 FE) MG tablet Take 1 tablet (325 mg total) by mouth 2 (two) times daily with a meal. (Patient taking differently: Take 325  mg by mouth daily with breakfast. )  3   No current facility-administered medications on file prior to visit.     BP 122/70 (BP Location: Left Arm, Patient Position: Sitting, Cuff Size: Normal)   Pulse 80   Temp 97.5 F (36.4 C) (Oral)  Ht '5\' 3"'$  (1.6 m)   Wt 188 lb 6.4 oz (85.5 kg)   SpO2 98%   BMI 33.37 kg/m     Review of Systems  Constitutional: Positive for fatigue.  HENT: Negative for congestion, dental problem, hearing loss, rhinorrhea, sinus pressure, sore throat and tinnitus.   Eyes: Negative for pain, discharge and visual disturbance.  Respiratory: Negative for cough and shortness of breath.   Cardiovascular: Negative for chest pain, palpitations and leg swelling.  Gastrointestinal: Positive for abdominal pain. Negative for abdominal distention, blood in stool, constipation, diarrhea, nausea and vomiting.  Genitourinary: Negative for difficulty urinating, dysuria, flank pain, frequency, hematuria, pelvic pain, urgency, vaginal bleeding, vaginal discharge and vaginal pain.  Musculoskeletal: Negative for arthralgias, gait problem and joint swelling.  Skin: Negative for rash.  Neurological: Positive for weakness. Negative for dizziness, syncope, speech difficulty, numbness and headaches.  Hematological: Negative for adenopathy.  Psychiatric/Behavioral: Negative for agitation, behavioral problems and dysphoric mood. The patient is not nervous/anxious.        Objective:   Physical Exam  Constitutional: She is oriented to person, place, and time. She appears well-developed and well-nourished.  HENT:  Head: Normocephalic.  Right Ear: External ear normal.  Left Ear: External ear normal.  Mouth/Throat: Oropharynx is clear and moist.  Eyes: Conjunctivae and EOM are normal. Pupils are equal, round, and reactive to light.  Neck: Normal range of motion. Neck supple. No thyromegaly present.  Cardiovascular: Normal rate, regular rhythm, normal heart sounds and intact distal  pulses.   Pulmonary/Chest: Effort normal and breath sounds normal.  Abdominal: Soft. Bowel sounds are normal. She exhibits no mass. There is no tenderness.  No focal tenderness  Musculoskeletal: Normal range of motion.  Lymphadenopathy:    She has no cervical adenopathy.  Neurological: She is alert and oriented to person, place, and time.  Skin: Skin is warm and dry. No rash noted.  Psychiatric: She has a normal mood and affect. Her behavior is normal.          Assessment & Plan:   History of colon cancer.  Follow-up colonoscopy in June History of anemia.  We'll follow-up a CBC Nonspecific abdominal discomfort Chronic atrial fibrillation Essential hypertension  Follow-up GI and oncology Check CBC Continue iron supplementation Follow-up 4 months  Danaye Sobh Pilar Plate

## 2016-10-23 NOTE — Patient Instructions (Signed)
Continue iron supplement  GI follow-up and oncology follow-up as scheduled  Report any clinical worsening  Return in 4 months for follow-up  Limit your sodium (Salt) intake

## 2016-10-26 ENCOUNTER — Emergency Department (HOSPITAL_COMMUNITY): Payer: PPO

## 2016-10-26 ENCOUNTER — Encounter (HOSPITAL_COMMUNITY): Payer: Self-pay | Admitting: Emergency Medicine

## 2016-10-26 ENCOUNTER — Emergency Department (HOSPITAL_COMMUNITY)
Admission: EM | Admit: 2016-10-26 | Discharge: 2016-10-26 | Disposition: A | Discharge status: Home / Self Care | Payer: PPO | Attending: Emergency Medicine | Admitting: Emergency Medicine

## 2016-10-26 DIAGNOSIS — I110 Hypertensive heart disease with heart failure: Secondary | ICD-10-CM | POA: Diagnosis not present

## 2016-10-26 DIAGNOSIS — I5032 Chronic diastolic (congestive) heart failure: Secondary | ICD-10-CM | POA: Insufficient documentation

## 2016-10-26 DIAGNOSIS — J449 Chronic obstructive pulmonary disease, unspecified: Secondary | ICD-10-CM | POA: Diagnosis not present

## 2016-10-26 DIAGNOSIS — R112 Nausea with vomiting, unspecified: Secondary | ICD-10-CM | POA: Insufficient documentation

## 2016-10-26 DIAGNOSIS — I2510 Atherosclerotic heart disease of native coronary artery without angina pectoris: Secondary | ICD-10-CM | POA: Insufficient documentation

## 2016-10-26 DIAGNOSIS — Z955 Presence of coronary angioplasty implant and graft: Secondary | ICD-10-CM | POA: Diagnosis not present

## 2016-10-26 DIAGNOSIS — Z96653 Presence of artificial knee joint, bilateral: Secondary | ICD-10-CM | POA: Insufficient documentation

## 2016-10-26 DIAGNOSIS — Z7901 Long term (current) use of anticoagulants: Secondary | ICD-10-CM | POA: Diagnosis not present

## 2016-10-26 DIAGNOSIS — Z87891 Personal history of nicotine dependence: Secondary | ICD-10-CM | POA: Insufficient documentation

## 2016-10-26 DIAGNOSIS — R1010 Upper abdominal pain, unspecified: Secondary | ICD-10-CM

## 2016-10-26 DIAGNOSIS — R51 Headache: Secondary | ICD-10-CM | POA: Diagnosis not present

## 2016-10-26 DIAGNOSIS — R110 Nausea: Secondary | ICD-10-CM | POA: Diagnosis not present

## 2016-10-26 DIAGNOSIS — R531 Weakness: Secondary | ICD-10-CM

## 2016-10-26 DIAGNOSIS — Z8673 Personal history of transient ischemic attack (TIA), and cerebral infarction without residual deficits: Secondary | ICD-10-CM | POA: Diagnosis not present

## 2016-10-26 DIAGNOSIS — Z85038 Personal history of other malignant neoplasm of large intestine: Secondary | ICD-10-CM | POA: Insufficient documentation

## 2016-10-26 LAB — COMPREHENSIVE METABOLIC PANEL
ALT: 32 U/L (ref 14–54)
AST: 41 U/L (ref 15–41)
Albumin: 3.8 g/dL (ref 3.5–5.0)
Alkaline Phosphatase: 60 U/L (ref 38–126)
Anion gap: 5 (ref 5–15)
BUN: 25 mg/dL — ABNORMAL HIGH (ref 6–20)
CO2: 29 mmol/L (ref 22–32)
Calcium: 8.9 mg/dL (ref 8.9–10.3)
Chloride: 105 mmol/L (ref 101–111)
Creatinine, Ser: 1.26 mg/dL — ABNORMAL HIGH (ref 0.44–1.00)
GFR calc Af Amer: 44 mL/min — ABNORMAL LOW (ref >60)
GFR calc non Af Amer: 38 mL/min — ABNORMAL LOW (ref >60)
Glucose, Bld: 106 mg/dL — ABNORMAL HIGH (ref 65–99)
Potassium: 4 mmol/L (ref 3.5–5.1)
Sodium: 139 mmol/L (ref 135–145)
Total Bilirubin: 0.6 mg/dL (ref 0.3–1.2)
Total Protein: 7 g/dL (ref 6.5–8.1)

## 2016-10-26 LAB — URINALYSIS, ROUTINE W REFLEX MICROSCOPIC
Bacteria, UA: NONE SEEN
Bilirubin Urine: NEGATIVE
Glucose, UA: NEGATIVE mg/dL
Hgb urine dipstick: NEGATIVE
Ketones, ur: NEGATIVE mg/dL
Nitrite: NEGATIVE
Protein, ur: NEGATIVE mg/dL
Specific Gravity, Urine: 1.02 (ref 1.005–1.030)
pH: 7 (ref 5.0–8.0)

## 2016-10-26 LAB — TROPONIN I: Troponin I: <0.03 ng/mL (ref <0.03)

## 2016-10-26 LAB — CBC
HCT: 33.6 % — ABNORMAL LOW (ref 36.0–46.0)
Hemoglobin: 10 g/dL — ABNORMAL LOW (ref 12.0–15.0)
MCH: 23.6 pg — ABNORMAL LOW (ref 26.0–34.0)
MCHC: 29.8 g/dL — ABNORMAL LOW (ref 30.0–36.0)
MCV: 79.2 fL (ref 78.0–100.0)
Platelets: 144 10*3/uL — ABNORMAL LOW (ref 150–400)
RBC: 4.24 MIL/uL (ref 3.87–5.11)
RDW: 17.3 % — ABNORMAL HIGH (ref 11.5–15.5)
WBC: 6.8 10*3/uL (ref 4.0–10.5)

## 2016-10-26 LAB — LIPASE, BLOOD: Lipase: 44 U/L (ref 11–51)

## 2016-10-26 MED ORDER — ONDANSETRON HCL 4 MG/2ML IJ SOLN
4.0000 mg | Freq: Once | INTRAMUSCULAR | Status: AC
  Administered 2016-10-26: 4 mg via INTRAVENOUS

## 2016-10-26 MED ORDER — MORPHINE SULFATE (PF) 4 MG/ML IV SOLN
2.0000 mg | Freq: Once | INTRAVENOUS | Status: AC
  Administered 2016-10-26: 2 mg via INTRAVENOUS
  Filled 2016-10-26: qty 1

## 2016-10-26 MED ORDER — FAMOTIDINE IN NACL 20-0.9 MG/50ML-% IV SOLN
20.0000 mg | Freq: Once | INTRAVENOUS | Status: AC
Start: 2016-10-26 — End: 2016-10-26
  Administered 2016-10-26: 20 mg via INTRAVENOUS
  Filled 2016-10-26: qty 50

## 2016-10-26 MED ORDER — ONDANSETRON HCL 4 MG/2ML IJ SOLN
4.0000 mg | Freq: Once | INTRAMUSCULAR | Status: AC
  Administered 2016-10-26: 4 mg via INTRAVENOUS
  Filled 2016-10-26: qty 2

## 2016-10-26 NOTE — ED Notes (Signed)
Patient transported to X-ray 

## 2016-10-26 NOTE — ED Provider Notes (Signed)
[facility]-EMERGENCY DEPT Provider Note   CSN: [id number] Arrival date & time: 10/26/16  1633     History   Chief Complaint Chief Complaint  Patient presents with  . Weakness  . Emesis  . Nausea    HPI [name] is a 83 y.o. female.  Patient co intermittent nausea and vomiting in past 2-3 weeks. Last vomited this AM. Has tolerated a small amount liquids since.  No abdominal distension or pain. Had normal bm today, was loose, no severe diarrhea. No dysuria or gu c/o. No fever or chills. Has had a daily pain under left breast. Constant. Dull. Not pleuritic. No exertional cp or discomfort. No unusual doe. +generalized weakness. Had syncopal event yesterday, denies injury, states had to push alert necklace to get help up. Has been ambulatory since. Occasional non prod cough. No other uri symptoms. Dull headache, moderate, persistent x 3 days. No back/spine pain. No recent change in meds. Saw pcp 3 days ago w same symptoms - no specific dx made then.    The history is provided by the patient.  Abdominal Pain   Associated symptoms include vomiting and headaches. Pertinent negatives include fever and dysuria.  Weakness  Associated symptoms include chest pain, vomiting and headaches. Pertinent negatives include no shortness of breath and no confusion.  Emesis   Associated symptoms include cough and headaches. Pertinent negatives include no abdominal pain, no chills and no fever.    Past Medical History:  Diagnosis Date  . Adenocarcinoma, colon [facility]) Oncologist-- Dr Malachy Mood   Multifocal (2) Colon cancer @ ileocecal valve and ascending ( [id number]), Stage IIIB, grade I, MMR normal , 2 of 16 +lymph nodes, negative surgical margins---  06-02-2014  Right hemicolectomy w/ colostomy  . Allergy   . Anxiety   . CAD (coronary artery disease)    a. LHC 4/09: pLAD 40, mDx 70-75, pLCx 30, mLCx 50, mRCA 90, EF 60% >> PCI: BMS x2 to RCA;  b. Myoview 8/12: normal  . Cataract    bil cateracts removed  .  Chronic anemia   . Chronic diastolic CHF (congestive heart failure) (HCC)    a. Echo 912 - Mild LVH, EF 55-60%, no RWMA, Gr 1 DD, mild MR, mild LAE, mild RAE, PASP 59 mmHg (mod to severe pulmo HTN)  . Colostomy in place [facility])    s/p colostomy takedown 5/16  . Colovaginal fistula    s/p colostomy >> colostomy takedown 5/16  . COPD with chronic bronchitis (HCC)   . Depression   . Dysrhythmia    A fib  . Essential hypertension   . GERD (gastroesophageal reflux disease)   . History of adenomatous polyp of colon   . History of blood transfusion   . History of DVT of lower extremity   . History of hiatal hernia   . History of TIA (transient ischemic attack)    a. Head CT 6/15: small chronic lacunar infarct in thalamus  . Hyperlipidemia   . OA (osteoarthritis)   . Peripheral neuropathy   . Pneumonia   . Pulmonary HTN (HCC)    a. PASP on Echo in 9/12:  59 mmHg  . Renal insufficiency   . Sigmoid diverticulosis    s/p sigmoid colectomy  . Stroke (HCC)    TIAs  . Wears dentures   . Wears glasses     Patient Active Problem List   Diagnosis Date Noted  . Chronic atrial fibrillation (HCC) 08/05/2016  . Coronary artery disease of native  artery of native heart with stable angina pectoris (HCC) 08/05/2016  . Persistent atrial fibrillation (HCC) 08/25/2015  . Failed total left knee replacement (HCC) 08/02/2015  . Sigmoid diverticulitis 11/24/2014  . Acute blood loss anemia 06/13/2014  . Neuropathic pain 06/13/2014  . Insomnia 06/13/2014  . Anxiety and depression 06/13/2014  . Cancer of ascending colon (HCC) 06/13/2014  . Small bowel obstruction (HCC) 05/31/2014  . Colovaginal fistula 04/20/2014  . Lethargy 09/15/2013  . DEPRESSION 02/07/2010  . DIASTOLIC HEART FAILURE, CHRONIC 02/27/2009  . PURE HYPERCHOLESTEROLEMIA 04/05/2008  . Coronary atherosclerosis 04/05/2008  . Osteoarthritis 05/19/2007  . Essential hypertension 01/21/2007  . GERD 01/21/2007  . HIATAL HERNIA WITH REFLUX  01/21/2007  . Diverticulosis of large intestine 01/21/2007  . COLONIC POLYPS, HX OF 01/21/2007    Past Surgical History:  Procedure Laterality Date  . ABDOMINAL HYSTERECTOMY    . ANTERIOR CERVICAL DECOMP/DISCECTOMY FUSION  01-31-2009   C5 -- 7  . APPENDECTOMY    . Bone spurs Bilateral    Feet  . CARDIOVASCULAR STRESS TEST  02-26-2011   Normal lexiscan no exercise study/  no ischemia/  normal LV function and wall motion, ef 67%  . CARPAL TUNNEL RELEASE Right   . CATARACT EXTRACTION W/ INTRAOCULAR LENS  IMPLANT, BILATERAL Bilateral   . CHOLECYSTECTOMY    . COLOSTOMY N/A 06/02/2014   Procedure: DIVERTING DESCENDING END COLOSTOMY;  Surgeon: Manus Rudd, MD;  Location: [facility] OR;  Service: General;  Laterality: N/A;  . CORONARY ANGIOPLASTY WITH STENT PLACEMENT  11-04-2007  dr bensimhon   BMS x2 to RCA/  mild Non-obstructive disease LAD/  normal LVF  . DILATION AND CURETTAGE OF UTERUS    . EVALUATION UNDER ANESTHESIA WITH ANAL FISTULECTOMY N/A 10/13/2014   Procedure: ANAL EXAM UNDER ANESTHESIA ;  Surgeon: Romie Levee, MD;  Location: [facility] ORS;  Service: General;  Laterality: N/A;  . HAND SURGERY     Tendon repair  . LAPAROSCOPIC SIGMOID COLECTOMY N/A 11/24/2014   Procedure: SIGMOID COLECTOMY AND COLSOTOMY CLOSURE;  Surgeon: Manus Rudd, MD;  Location: [facility] OR;  Service: General;  Laterality: N/A;  . LUMBAR LAMINECTOMY  10-29-2002,  1969   Left  L3 -- 4  decompression  . PARTIAL COLECTOMY N/A 06/02/2014   Procedure: RIGHT PARTIAL COLECTOMY ;  Surgeon: Manus Rudd, MD;  Location: [facility] OR;  Service: General;  Laterality: N/A;  . PATELLECTOMY Right 09/14/2013   Procedure: PATELLECTOMY;  Surgeon: Cammy Copa, MD;  Location: [facility] OR;  Service: Orthopedics;  Laterality: Right;  . PROCTOSCOPY N/A 10/13/2014   Procedure: RIDGE PROCTOSCOPY;  Surgeon: Romie Levee, MD;  Location: [facility] ORS;  Service: General;  Laterality: N/A;  . REVISION TOTAL KNEE ARTHROPLASTY Bilateral right  04-02-2011/  left 1996  & 10-05-1999  . ROTATOR CUFF REPAIR Bilateral   . TONSILLECTOMY    . TOTAL KNEE ARTHROPLASTY Bilateral left 1994/  right 2000  . TOTAL KNEE REVISION Left 08/02/2015   Procedure: LEFT FEMORAL REVISION;  Surgeon: Ollen Gross, MD;  Location: [facility] ORS;  Service: Orthopedics;  Laterality: Left;  . TRANSTHORACIC ECHOCARDIOGRAM  12-01-2009   Grade I diastolic dysfunction/  ef 60%/  moderate MR/  mild TR    OB History    No data available       Home Medications    Prior to Admission medications   Medication Sig Start Date End Date Taking? Authorizing Provider  acetaminophen (TYLENOL) 325 MG tablet Take 650 mg by mouth every 6 (six) hours as needed for moderate  pain.     Historical Provider, MD  apixaban (ELIQUIS) 5 MG TABS tablet Take 1 tablet (5 mg total) by mouth 2 (two) times daily. 10/23/16   Gordy Savers, MD  benazepril (LOTENSIN) 40 MG tablet Take 1 tablet (40 mg total) by mouth daily. 10/23/16   Gordy Savers, MD  diltiazem (CARDIZEM) 30 MG tablet Take 1 tablet (30 mg total) by mouth every 8 (eight) hours. 10/23/16   Gordy Savers, MD  esomeprazole (NEXIUM) 20 MG capsule Take 1 capsule (20 mg total) by mouth daily at 12 noon. 10/23/16   Gordy Savers, MD  ferrous sulfate 325 (65 FE) MG tablet Take 1 tablet (325 mg total) by mouth 2 (two) times daily with a meal. Patient taking differently: Take 325 mg by mouth daily with breakfast.  12/30/13   Henderson Cloud, MD  furosemide (LASIX) 20 MG tablet Take 1 tablet (20 mg total) by mouth 3 (three) times a week. (Mondays, Wednesdays, and Fridays) 10/23/16   Gordy Savers, MD  gabapentin (NEURONTIN) 600 MG tablet Take 1 tablet (600 mg total) by mouth 3 (three) times daily. 10/23/16   Gordy Savers, MD  HYDROcodone-acetaminophen (NORCO/VICODIN) 5-325 MG tablet Take 1 tablet by mouth every 6 (six) hours as needed for moderate pain. 10/23/16   Gordy Savers, MD  LORazepam (ATIVAN) 0.5 MG tablet TAKE ONE  TABLET BY MOUTH TWICE DAILY AS NEEDED FOR ANXIETY OR SLEEP 10/23/16   Gordy Savers, MD  metoprolol (LOPRESSOR) 100 MG tablet Take 1 tablet (100 mg total) by mouth 2 (two) times daily. 10/23/16   Gordy Savers, MD  nitroGLYCERIN (NITROSTAT) 0.4 MG SL tablet Place 1 tablet (0.4 mg total) under the tongue every 5 (five) minutes as needed for chest pain (x 3 pills). Reported on 09/01/2015 10/23/16   Gordy Savers, MD  promethazine (PHENERGAN) 12.5 MG tablet TAKE 1 TABLET (12.5 MG TOTAL) BY MOUTH EVERY 8 HOURS AS NEEDED FOR NAUSEA OR VOMITING. 10/23/16   Gordy Savers, MD  sertraline (ZOLOFT) 50 MG tablet Take 1 tablet (50 mg total) by mouth daily. 10/23/16   Gordy Savers, MD  traMADol (ULTRAM) 50 MG tablet Take 1 tablet (50 mg total) by mouth daily as needed for moderate pain. 10/23/16   Gordy Savers, MD  traZODone (DESYREL) 100 MG tablet Take 0.5 tablets (50 mg total) by mouth at bedtime. 10/23/16   Gordy Savers, MD  triamcinolone cream (KENALOG) 0.1 % Apply 1 application topically 2 (two) times daily as needed (Eczema on legs). 10/23/16   Gordy Savers, MD    Family History Family History  Problem Relation Age of Onset  . Osteoarthritis Mother   . Heart failure Mother   . Colon cancer Maternal Uncle 70  . Heart Problems Father   . Cancer Sister 32    ? colon cancer   . Kidney cancer Brother 30  . Esophageal cancer Neg Hx   . Rectal cancer Neg Hx   . Stomach cancer Neg Hx     Social History Social History  Substance Use Topics  . Smoking status: Former Smoker    Packs/day: 0.50    Years: 15.00    Types: Cigarettes    Quit date: 07/16/1967  . Smokeless tobacco: Never Used  . Alcohol use No     Allergies   Patient has no known allergies.   Review of Systems Review of Systems  Constitutional: Negative for chills and  fever.  HENT: Negative for sore throat.   Eyes: Negative for redness.  Respiratory: Positive for cough. Negative for  shortness of breath.   Cardiovascular: Positive for chest pain.  Gastrointestinal: Positive for vomiting. Negative for abdominal pain and blood in stool.  Genitourinary: Negative for dysuria and flank pain.  Musculoskeletal: Negative for back pain and neck pain.  Skin: Negative for rash.  Neurological: Positive for weakness and headaches. Negative for numbness.  Hematological: Does not bruise/bleed easily.  Psychiatric/Behavioral: Negative for confusion.     Physical Exam Updated Vital Signs BP (!) 139/97   Pulse 84   Temp 98.2 F (36.8 C) (Oral)   Resp 17   Ht '5\' 3"'$  (1.6 m)   Wt 85.3 kg   SpO2 94%   BMI 33.30 kg/m   Physical Exam  Constitutional: She appears well-developed and well-nourished. No distress.  HENT:  Head: Atraumatic.  Dry mm  Eyes: Conjunctivae are normal. Pupils are equal, round, and reactive to light. No scleral icterus.  Neck: Neck supple. No tracheal deviation present.  No neck stiffness or rigidity. No bruits  Cardiovascular: Normal rate, regular rhythm, normal heart sounds and intact distal pulses.  Exam reveals no gallop and no friction rub.   No murmur heard. Pulmonary/Chest: Effort normal and breath sounds normal. No respiratory distress.  Abdominal: Soft. Normal appearance and bowel sounds are normal. She exhibits no distension and no mass. There is no tenderness. There is no rebound and no guarding. No hernia.  Genitourinary:  Genitourinary Comments: No cva tenderness  Musculoskeletal: She exhibits no edema.  Neurological: She is alert.  Skin: Skin is warm and dry. No rash noted.  Psychiatric: She has a normal mood and affect.  Nursing note and vitals reviewed.    ED Treatments / Results  Labs (all labs ordered are listed, but only abnormal results are displayed)   Results for orders placed or performed during the hospital encounter of 10/26/16  Lipase, blood  Result Value Ref Range   Lipase 44 11 - 51 U/L  Comprehensive metabolic  panel  Result Value Ref Range   Sodium 139 135 - 145 mmol/L   Potassium 4.0 3.5 - 5.1 mmol/L   Chloride 105 101 - 111 mmol/L   CO2 29 22 - 32 mmol/L   Glucose, Bld 106 (H) 65 - 99 mg/dL   BUN 25 (H) 6 - 20 mg/dL   Creatinine, Ser 1.26 (H) 0.44 - 1.00 mg/dL   Calcium 8.9 8.9 - 10.3 mg/dL   Total Protein 7.0 6.5 - 8.1 g/dL   Albumin 3.8 3.5 - 5.0 g/dL   AST 41 15 - 41 U/L   ALT 32 14 - 54 U/L   Alkaline Phosphatase 60 38 - 126 U/L   Total Bilirubin 0.6 0.3 - 1.2 mg/dL   GFR calc non Af Amer 38 (L) >60 mL/min   GFR calc Af Amer 44 (L) >60 mL/min   Anion gap 5 5 - 15  CBC  Result Value Ref Range   WBC 6.8 4.0 - 10.5 K/uL   RBC 4.24 3.87 - 5.11 MIL/uL   Hemoglobin 10.0 (L) 12.0 - 15.0 g/dL   HCT 33.6 (L) 36.0 - 46.0 %   MCV 79.2 78.0 - 100.0 fL   MCH 23.6 (L) 26.0 - 34.0 pg   MCHC 29.8 (L) 30.0 - 36.0 g/dL   RDW 17.3 (H) 11.5 - 15.5 %   Platelets 144 (L) 150 - 400 K/uL  Urinalysis, Routine w reflex microscopic  Result Value Ref Range   Color, Urine AMBER (A) YELLOW   APPearance HAZY (A) CLEAR   Specific Gravity, Urine 1.020 1.005 - 1.030   pH 7.0 5.0 - 8.0   Glucose, UA NEGATIVE NEGATIVE mg/dL   Hgb urine dipstick NEGATIVE NEGATIVE   Bilirubin Urine NEGATIVE NEGATIVE   Ketones, ur NEGATIVE NEGATIVE mg/dL   Protein, ur NEGATIVE NEGATIVE mg/dL   Nitrite NEGATIVE NEGATIVE   Leukocytes, UA TRACE (A) NEGATIVE   RBC / HPF 0-5 0 - 5 RBC/hpf   WBC, UA 0-5 0 - 5 WBC/hpf   Bacteria, UA NONE SEEN NONE SEEN   Squamous Epithelial / LPF 0-5 (A) NONE SEEN   Mucous PRESENT    Hyaline Casts, UA PRESENT   Troponin I  Result Value Ref Range   Troponin I <0.03 <0.03 ng/mL   Dg Chest 2 View  Result Date: 10/26/2016 CLINICAL DATA:  Recent fall and weakness EXAM: CHEST  2 VIEW COMPARISON:  09/12/2016 FINDINGS: Cardiac shadow is enlarged. No focal infiltrate or sizable effusion is seen. Degenerative changes of thoracic spine are noted. Postoperative changes in the cervical spine are seen.  IMPRESSION: No acute abnormality noted. Electronically Signed   By: Inez Catalina M.D.   On: 10/26/2016 18:45   Ct Head Wo Contrast  Result Date: 10/26/2016 CLINICAL DATA:  Recent slip and fall with headaches, initial encounter EXAM: CT HEAD WITHOUT CONTRAST TECHNIQUE: Contiguous axial images were obtained from the base of the skull through the vertex without intravenous contrast. COMPARISON:  None. FINDINGS: Brain: No evidence of acute infarction, hemorrhage, hydrocephalus, extra-axial collection or mass lesion/mass effect.Mild atrophic changes are noted. Vascular: No hyperdense vessel or unexpected calcification. Skull: Normal. Negative for fracture or focal lesion. Sinuses/Orbits: No acute finding. Other: None. IMPRESSION: Mild atrophic changes without acute abnormality. Electronically Signed   By: Inez Catalina M.D.   On: 10/26/2016 19:16      EKG  EKG Interpretation None       Radiology Dg Chest 2 View  Result Date: 10/26/2016 CLINICAL DATA:  Recent fall and weakness EXAM: CHEST  2 VIEW COMPARISON:  09/12/2016 FINDINGS: Cardiac shadow is enlarged. No focal infiltrate or sizable effusion is seen. Degenerative changes of thoracic spine are noted. Postoperative changes in the cervical spine are seen. IMPRESSION: No acute abnormality noted. Electronically Signed   By: Inez Catalina M.D.   On: 10/26/2016 18:45   Ct Head Wo Contrast  Result Date: 10/26/2016 CLINICAL DATA:  Recent slip and fall with headaches, initial encounter EXAM: CT HEAD WITHOUT CONTRAST TECHNIQUE: Contiguous axial images were obtained from the base of the skull through the vertex without intravenous contrast. COMPARISON:  None. FINDINGS: Brain: No evidence of acute infarction, hemorrhage, hydrocephalus, extra-axial collection or mass lesion/mass effect.Mild atrophic changes are noted. Vascular: No hyperdense vessel or unexpected calcification. Skull: Normal. Negative for fracture or focal lesion. Sinuses/Orbits: No acute  finding. Other: None. IMPRESSION: Mild atrophic changes without acute abnormality. Electronically Signed   By: Inez Catalina M.D.   On: 10/26/2016 19:16    Procedures Procedures (including critical care time)  Medications Ordered in ED Medications - No data to display   Initial Impression / Assessment and Plan / ED Course  I have reviewed the triage vital signs and the nursing notes.  Pertinent labs & imaging results that were available during my care of the patient were reviewed by me and considered in my medical decision making (see chart for details).  Iv ns. Labs. Imaging studies.  Reviewed nursing notes and prior charts for additional history.   Patient presents w prolonged symptoms - in looking at chart/discussion with family pt with same symptoms recurrently for many months.  Pt had ct chest/abd/pelvic last month, negative for acute process.  Today's imaging and labs unremarkable.  Morphine 2 mg iv, zofran iv, pepcid for symptom relief.  Recheck, not emesis x 3 hours in ED. abd soft nt.   Patient currently appears stable for d/c.   Pt has gi f/u scheduled.  rec close pcp f/u.     Final Clinical Impressions(s) / ED Diagnoses   Final diagnoses:  None    New Prescriptions New Prescriptions   No medications on file     Lajean Saver, MD 10/26/16 1950

## 2016-10-26 NOTE — Discharge Instructions (Signed)
It was our pleasure to provide your ER care today - we hope that you feel better.  Rest.  Drink plenty of fluids.   Continue your acid blocker therapy. Take your nausea medication as need.   Follow up with primary care doctor in the coming week.  Return to ER if worse, new symptoms, fevers, persistent vomiting, new or severe pain, other concern.

## 2016-10-26 NOTE — ED Triage Notes (Signed)
Pt. Stated, I've been sick on my stomach for about 2 weeks. Went to doctor on Wednesday and just took blood work and said he would get back with her. Suppose to have a colonoscopy in June.  I've had N/V and Im so weak now.

## 2016-10-26 NOTE — ED Notes (Signed)
ED Provider at bedside. 

## 2016-10-28 ENCOUNTER — Telehealth: Payer: Self-pay | Admitting: Internal Medicine

## 2016-10-28 NOTE — Telephone Encounter (Signed)
Pts granddaughter would like to see if Dr. Raliegh Ip would call Dr. Blanch Media office to see if they could see her sooner due to the pt had gone to the ER and they state to see if her PCP could get her in sooner than 12/25/16 for the GI appointment.  Granddaughter would like for someone to give her a call back.

## 2016-10-29 ENCOUNTER — Telehealth: Payer: Self-pay | Admitting: Internal Medicine

## 2016-10-29 DIAGNOSIS — I4891 Unspecified atrial fibrillation: Secondary | ICD-10-CM

## 2016-10-29 NOTE — Telephone Encounter (Signed)
Pt daughter [name] is requesting dr k to return her call concerning her mother. Pt was at er on saturday

## 2016-10-30 NOTE — Telephone Encounter (Signed)
Please see message below, please advise 

## 2016-10-30 NOTE — Telephone Encounter (Signed)
Please arrange for home health help Please also call GI and ask for an earlier appointment; if necessary.  Okay to schedule with alternative provider if this can be scheduled, sooner

## 2016-10-30 NOTE — Telephone Encounter (Signed)
See message below, Please advise 

## 2016-10-30 NOTE — Telephone Encounter (Signed)
° ° ° °  [name] pt daughter called to say she is trying to get her Mom some home health help and would like a call back   [phone]

## 2016-11-01 NOTE — Telephone Encounter (Signed)
Spoke with Dr Blanch Media nurse and she stated he is booked up until July... She gave me his pager number and stated that if the MD called him he might work her in. Number is: [phone] [place] referral ordered.

## 2016-11-01 NOTE — Telephone Encounter (Signed)
Has this been done yet?

## 2016-11-02 DIAGNOSIS — I110 Hypertensive heart disease with heart failure: Secondary | ICD-10-CM | POA: Diagnosis not present

## 2016-11-02 DIAGNOSIS — M6281 Muscle weakness (generalized): Secondary | ICD-10-CM | POA: Diagnosis not present

## 2016-11-02 DIAGNOSIS — J449 Chronic obstructive pulmonary disease, unspecified: Secondary | ICD-10-CM | POA: Diagnosis not present

## 2016-11-02 DIAGNOSIS — G629 Polyneuropathy, unspecified: Secondary | ICD-10-CM | POA: Diagnosis not present

## 2016-11-02 DIAGNOSIS — F419 Anxiety disorder, unspecified: Secondary | ICD-10-CM | POA: Diagnosis not present

## 2016-11-04 NOTE — Telephone Encounter (Signed)
If Dr. Henrene Pastor is unavailable, patient would be agreeable to see another GI provider, if an earlier appointment is available

## 2016-11-05 DIAGNOSIS — H532 Diplopia: Secondary | ICD-10-CM | POA: Diagnosis not present

## 2016-11-05 DIAGNOSIS — H5022 Vertical strabismus, left eye: Secondary | ICD-10-CM | POA: Diagnosis not present

## 2016-11-05 NOTE — Telephone Encounter (Signed)
Please see if one of Dr.  Blanch Media associates can see this patient earlier

## 2016-11-06 NOTE — Telephone Encounter (Signed)
Appointment made for Anes 1, 2018 @10am .

## 2016-11-06 NOTE — Telephone Encounter (Signed)
Pt and daughter made aware  °

## 2016-11-07 DIAGNOSIS — I110 Hypertensive heart disease with heart failure: Secondary | ICD-10-CM | POA: Diagnosis not present

## 2016-11-07 DIAGNOSIS — M6281 Muscle weakness (generalized): Secondary | ICD-10-CM | POA: Diagnosis not present

## 2016-11-07 DIAGNOSIS — D649 Anemia, unspecified: Secondary | ICD-10-CM | POA: Diagnosis not present

## 2016-11-07 DIAGNOSIS — J449 Chronic obstructive pulmonary disease, unspecified: Secondary | ICD-10-CM | POA: Diagnosis not present

## 2016-11-07 DIAGNOSIS — I2510 Atherosclerotic heart disease of native coronary artery without angina pectoris: Secondary | ICD-10-CM | POA: Diagnosis not present

## 2016-11-07 DIAGNOSIS — G629 Polyneuropathy, unspecified: Secondary | ICD-10-CM | POA: Diagnosis not present

## 2016-11-07 DIAGNOSIS — F419 Anxiety disorder, unspecified: Secondary | ICD-10-CM | POA: Diagnosis not present

## 2016-11-07 DIAGNOSIS — I4891 Unspecified atrial fibrillation: Secondary | ICD-10-CM | POA: Diagnosis not present

## 2016-11-07 DIAGNOSIS — M1990 Unspecified osteoarthritis, unspecified site: Secondary | ICD-10-CM | POA: Diagnosis not present

## 2016-11-07 DIAGNOSIS — I5032 Chronic diastolic (congestive) heart failure: Secondary | ICD-10-CM | POA: Diagnosis not present

## 2016-11-08 DIAGNOSIS — F419 Anxiety disorder, unspecified: Secondary | ICD-10-CM | POA: Diagnosis not present

## 2016-11-08 DIAGNOSIS — G629 Polyneuropathy, unspecified: Secondary | ICD-10-CM | POA: Diagnosis not present

## 2016-11-08 DIAGNOSIS — I110 Hypertensive heart disease with heart failure: Secondary | ICD-10-CM | POA: Diagnosis not present

## 2016-11-08 DIAGNOSIS — M6281 Muscle weakness (generalized): Secondary | ICD-10-CM | POA: Diagnosis not present

## 2016-11-08 DIAGNOSIS — J449 Chronic obstructive pulmonary disease, unspecified: Secondary | ICD-10-CM | POA: Diagnosis not present

## 2016-11-12 ENCOUNTER — Telehealth: Payer: Self-pay | Admitting: Emergency Medicine

## 2016-11-12 ENCOUNTER — Ambulatory Visit (INDEPENDENT_AMBULATORY_CARE_PROVIDER_SITE_OTHER): Payer: PPO | Admitting: Gastroenterology

## 2016-11-12 ENCOUNTER — Encounter: Payer: Self-pay | Admitting: Gastroenterology

## 2016-11-12 VITALS — BP 118/72 | Ht 63.5 in | Wt 186.1 lb

## 2016-11-12 DIAGNOSIS — K449 Diaphragmatic hernia without obstruction or gangrene: Secondary | ICD-10-CM | POA: Diagnosis not present

## 2016-11-12 DIAGNOSIS — D509 Iron deficiency anemia, unspecified: Secondary | ICD-10-CM | POA: Diagnosis not present

## 2016-11-12 DIAGNOSIS — Z7901 Long term (current) use of anticoagulants: Secondary | ICD-10-CM | POA: Diagnosis not present

## 2016-11-12 DIAGNOSIS — K219 Gastro-esophageal reflux disease without esophagitis: Secondary | ICD-10-CM | POA: Diagnosis not present

## 2016-11-12 DIAGNOSIS — R1013 Epigastric pain: Secondary | ICD-10-CM | POA: Diagnosis not present

## 2016-11-12 DIAGNOSIS — R112 Nausea with vomiting, unspecified: Secondary | ICD-10-CM | POA: Diagnosis not present

## 2016-11-12 MED ORDER — OMEPRAZOLE 40 MG PO CPDR: 40.0000 mg | DELAYED_RELEASE_CAPSULE | Freq: Two times a day (BID) | ORAL | 2 refills | Status: DC

## 2016-11-12 MED ORDER — NA SULFATE-K SULFATE-MG SULF 17.5-3.13-1.6 GM/177ML PO SOLN: 1.0000 | ORAL | 0 refills | Status: DC

## 2016-11-12 NOTE — Telephone Encounter (Signed)
[name] hold her Eliquis for 2 days prior to procedure

## 2016-11-12 NOTE — Telephone Encounter (Signed)
   Briasia Flinders Mooneyhan 01-Jul-1930 [id number]  Dear Dr.[name]:  We have scheduled the above named patient for a(n) colonoscopy procedure. Our records show that (s)he is on anticoagulation therapy.  Please advise as to whether the patient [place] come off their therapy of Eliquis 2 days prior to their procedure which is scheduled for 12-25-16.  Please route your response to Tinnie Gens, CMA or fax response to [phone].  Sincerely,    [facility]

## 2016-11-12 NOTE — Progress Notes (Signed)
Assessment and plans noted ?

## 2016-11-12 NOTE — Telephone Encounter (Signed)
Patient informed and verbalized understanding

## 2016-11-12 NOTE — Progress Notes (Signed)
11/12/2016 [name] [id number] 25-Jan-1930   HISTORY OF PRESENT ILLNESS:  This is a pleasant 83 year old female who is known to Dr. Henrene Pastor. She has history of GERD with hiatal hernia and history of colon cancer status post resection in November 2015. Was recently seen by Dr. Henrene Pastor at the beginning of March and scheduled for EGD and colonoscopy.  These procedures are scheduled for June. She is also on Eliquis for atrial fibrillation.  She is here today with complaints of epigastric abdominal pain, nausea, vomiting. The patient tells me that she is having significant pain in her upper abdomen that goes straight through to her back. She has constant nausea and vomiting on a daily basis. She had a CT scan of the abdomen and pelvis with contrast back in March, which did not show any acute issues. She was noted to have a moderate to large size hiatal hernia. She also still complains of a significant amount of acid reflux. Is only taking omeprazole 20 mg once daily.  Recent hepatic enzymes and lipase are within normal limits. Takes Phenergan for the nausea but does not use it frequently as it gives her a funny sensation in her head.    Past Medical History:  Diagnosis Date  . Adenocarcinoma, colon [facility]) Oncologist-- Dr Truitt Merle   Multifocal (2) Colon cancer @ ileocecal valve and ascending ( mT4N1Mx), Stage IIIB, grade I, MMR normal , 2 of 16 +lymph nodes, negative surgical margins---  06-02-2014  Right hemicolectomy w/ colostomy  . Allergy   . Anxiety   . CAD (coronary artery disease)    a. LHC 4/09: pLAD 40, mDx 70-75, pLCx 30, mLCx 50, mRCA 90, EF 60% >> PCI: BMS x2 to RCA;  b. Myoview 8/12: normal  . Cataract    bil cateracts removed  . Chronic anemia   . Chronic diastolic CHF (congestive heart failure) (HCC)    a. Echo 912 - Mild LVH, EF 55-60%, no RWMA, Gr 1 DD, mild MR, mild LAE, mild RAE, PASP 59 mmHg (mod to severe pulmo HTN)  . Colostomy in place [facility])    s/p colostomy takedown 5/16    . Colovaginal fistula    s/p colostomy >> colostomy takedown 5/16  . COPD with chronic bronchitis ([place])   . Depression   . Dysrhythmia    A fib  . Essential hypertension   . GERD (gastroesophageal reflux disease)   . History of adenomatous polyp of colon   . History of blood transfusion   . History of DVT of lower extremity   . History of hiatal hernia   . History of TIA (transient ischemic attack)    a. Head CT 6/15: small chronic lacunar infarct in thalamus  . Hyperlipidemia   . OA (osteoarthritis)   . Peripheral neuropathy   . Pneumonia   . Pulmonary HTN ([place])    a. PASP on Echo in 9/12:  59 mmHg  . Renal insufficiency   . Sigmoid diverticulosis    s/p sigmoid colectomy  . Stroke ([place])    TIAs  . Wears dentures   . Wears glasses    Past Surgical History:  Procedure Laterality Date  . ABDOMINAL HYSTERECTOMY    . ANTERIOR CERVICAL DECOMP/DISCECTOMY FUSION  01-31-2009   C5 -- 7  . APPENDECTOMY    . Bone spurs Bilateral    Feet  . CARDIOVASCULAR STRESS TEST  02-26-2011   Normal lexiscan no exercise study/  no ischemia/  normal LV function  and wall motion, ef 67%  . CARPAL TUNNEL RELEASE Right   . CATARACT EXTRACTION W/ INTRAOCULAR LENS  IMPLANT, BILATERAL Bilateral   . CHOLECYSTECTOMY    . COLOSTOMY N/A 06/02/2014   Procedure: DIVERTING DESCENDING END COLOSTOMY;  Surgeon: Donnie Mesa, MD;  Location: [place];  Service: General;  Laterality: N/A;  . CORONARY ANGIOPLASTY WITH STENT PLACEMENT  11-04-2007  dr bensimhon   BMS x2 to RCA/  mild Non-obstructive disease LAD/  normal LVF  . DILATION AND CURETTAGE OF UTERUS    . EVALUATION UNDER ANESTHESIA WITH ANAL FISTULECTOMY N/A 10/13/2014   Procedure: ANAL EXAM UNDER ANESTHESIA ;  Surgeon: [name], MD;  Location: [facility];  Service: General;  Laterality: N/A;  . HAND SURGERY     Tendon repair  . LAPAROSCOPIC SIGMOID COLECTOMY N/A 11/24/2014   Procedure: SIGMOID COLECTOMY AND COLSOTOMY CLOSURE;  Surgeon: Donnie Mesa,  MD;  Location: [place];  Service: General;  Laterality: N/A;  . LUMBAR LAMINECTOMY  10-29-2002,  1969   Left  L3 -- 4  decompression  . PARTIAL COLECTOMY N/A 06/02/2014   Procedure: RIGHT PARTIAL COLECTOMY ;  Surgeon: Donnie Mesa, MD;  Location: [place];  Service: General;  Laterality: N/A;  . PATELLECTOMY Right 09/14/2013   Procedure: PATELLECTOMY;  Surgeon: Meredith Pel, MD;  Location: [place];  Service: Orthopedics;  Laterality: Right;  . PROCTOSCOPY N/A 10/13/2014   Procedure: RIDGE PROCTOSCOPY;  Surgeon: [name], MD;  Location: [facility];  Service: General;  Laterality: N/A;  . REVISION TOTAL KNEE ARTHROPLASTY Bilateral right  04-02-2011/  left 1996 & 10-05-1999  . ROTATOR CUFF REPAIR Bilateral   . TONSILLECTOMY    . TOTAL KNEE ARTHROPLASTY Bilateral left 1994/  right 2000  . TOTAL KNEE REVISION Left 08/02/2015   Procedure: LEFT FEMORAL REVISION;  Surgeon: Gaynelle Arabian, MD;  Location: [facility];  Service: Orthopedics;  Laterality: Left;  . TRANSTHORACIC ECHOCARDIOGRAM  12-01-2009   Grade I diastolic dysfunction/  ef 60%/  moderate MR/  mild TR    reports that she quit smoking about 49 years ago. Her smoking use included Cigarettes. She has a 7.50 pack-year smoking history. She has never used smokeless tobacco. She reports that she does not drink alcohol or use drugs. family history includes Cancer (age of onset: 75) in her sister; Colon cancer (age of onset: 41) in her maternal uncle; Heart Problems in her father; Heart failure in her mother; Kidney cancer (age of onset: 62) in her brother; Osteoarthritis in her mother. No Known Allergies    Outpatient Encounter Prescriptions as of 11/12/2016  Medication Sig  . acetaminophen (TYLENOL) 325 MG tablet Take 650 mg by mouth every 6 (six) hours as needed for moderate pain.   Marland Kitchen apixaban (ELIQUIS) 5 MG TABS tablet Take 1 tablet (5 mg total) by mouth 2 (two) times daily.  . Ascorbic Acid (VITAMIN C) 1000 MG tablet Take 1,000 mg by mouth daily.    . benazepril (LOTENSIN) 40 MG tablet Take 1 tablet (40 mg total) by mouth daily.  Marland Kitchen diltiazem (CARDIZEM) 30 MG tablet Take 1 tablet (30 mg total) by mouth every 8 (eight) hours.  Marland Kitchen esomeprazole (NEXIUM) 20 MG capsule Take 1 capsule (20 mg total) by mouth daily at 12 noon. (Patient taking differently: Take 20 mg by mouth daily. )  . ferrous sulfate 325 (65 FE) MG tablet Take 1 tablet (325 mg total) by mouth 2 (two) times daily with a meal. (Patient taking differently: Take 325 mg  by mouth daily with breakfast. )  . furosemide (LASIX) 20 MG tablet Take 1 tablet (20 mg total) by mouth 3 (three) times a week. (Mondays, Wednesdays, and Fridays) (Patient taking differently: Take 20 mg by mouth every Monday, Wednesday, and Friday. )  . gabapentin (NEURONTIN) 600 MG tablet Take 1 tablet (600 mg total) by mouth 3 (three) times daily. (Patient taking differently: Take 600 mg by mouth 2 (two) times daily. )  . HYDROcodone-acetaminophen (NORCO/VICODIN) 5-325 MG tablet Take 1 tablet by mouth every 6 (six) hours as needed for moderate pain.  Marland Kitchen LORazepam (ATIVAN) 0.5 MG tablet TAKE ONE TABLET BY MOUTH TWICE DAILY AS NEEDED FOR ANXIETY OR SLEEP (Patient taking differently: Take 0.5 mg by mouth See admin instructions. Take 1 tablet (0.5 mg) by mouth daily at bedtime, [place] also take 1 tablet in the morning as needed for anxiety)  . metoprolol (LOPRESSOR) 100 MG tablet Take 1 tablet (100 mg total) by mouth 2 (two) times daily.  . nitroGLYCERIN (NITROSTAT) 0.4 MG SL tablet Place 1 tablet (0.4 mg total) under the tongue every 5 (five) minutes as needed for chest pain (x 3 pills). Reported on 09/01/2015  . Polyethyl Glycol-Propyl Glycol (SYSTANE OP) Place 1 drop into both eyes daily.  . promethazine (PHENERGAN) 12.5 MG tablet TAKE 1 TABLET (12.5 MG TOTAL) BY MOUTH EVERY 8 HOURS AS NEEDED FOR NAUSEA OR VOMITING. (Patient taking differently: Take 12.5 mg by mouth every 8 (eight) hours as needed for nausea or vomiting. )  .  sertraline (ZOLOFT) 50 MG tablet Take 1 tablet (50 mg total) by mouth daily.  . traMADol (ULTRAM) 50 MG tablet Take 1 tablet (50 mg total) by mouth daily as needed for moderate pain.  . traZODone (DESYREL) 100 MG tablet Take 0.5 tablets (50 mg total) by mouth at bedtime.  . triamcinolone cream (KENALOG) 0.1 % Apply 1 application topically 2 (two) times daily as needed (Eczema on legs).  . Na Sulfate-K Sulfate-Mg Sulf 17.5-3.13-1.6 GM/180ML SOLN Take 1 kit by mouth as directed.  Marland Kitchen omeprazole (PRILOSEC) 40 MG capsule Take 1 capsule (40 mg total) by mouth 2 (two) times daily.   No facility-administered encounter medications on file as of 11/12/2016.      REVIEW OF SYSTEMS  : All other systems reviewed and negative except where noted in the History of Present Illness.   PHYSICAL EXAM: BP 118/72   Ht 5' 3.5" (1.613 m)   Wt 186 lb 2 oz (84.4 kg)   BMI 32.45 kg/m  General: Well developed white female in no acute distress Head: Normocephalic and atraumatic Eyes:  Sclerae anicteric, conjunctiva pink. Ears: Normal auditory acuity Lungs: Clear throughout to auscultation; no increased WOB Heart: Irregularly irregular. Abdomen: Soft, non-distended.  BS present.  Epigastric TTP.  Rectal:  Will be done at the time of colonoscopy. Musculoskeletal: Symmetrical with no gross deformities  Skin: No lesions on visible extremities Extremities: No edema  Neurological: Alert oriented x 4, grossly non-focal Psychological:  Alert and cooperative. Normal mood and affect  ASSESSMENT AND PLAN: -Epigastric abdominal pain with nausea and vomiting:  Pain radiates to her back.  Has a moderate to large sized hiatal hernia so ? If it is related to that or severe reflux issues.  She is already scheduled for an EGD and colonoscopy with Dr. Henrene Pastor in June.  I offered to spit the procedures to try to do an EGD sooner, but patient declined for now.  I am going to increase her PPI to  omeprazole 40 mg BID.  CC:   Marletta Lor, MD

## 2016-11-12 NOTE — Patient Instructions (Signed)
We have sent the following medications to your pharmacy for you to pick up at your convenience:  Omeprazole 40 mg twice a day

## 2016-11-13 ENCOUNTER — Telehealth: Payer: Self-pay | Admitting: Internal Medicine

## 2016-11-13 DIAGNOSIS — I110 Hypertensive heart disease with heart failure: Secondary | ICD-10-CM | POA: Diagnosis not present

## 2016-11-13 DIAGNOSIS — F419 Anxiety disorder, unspecified: Secondary | ICD-10-CM | POA: Diagnosis not present

## 2016-11-13 DIAGNOSIS — J449 Chronic obstructive pulmonary disease, unspecified: Secondary | ICD-10-CM | POA: Diagnosis not present

## 2016-11-13 DIAGNOSIS — G629 Polyneuropathy, unspecified: Secondary | ICD-10-CM | POA: Diagnosis not present

## 2016-11-13 DIAGNOSIS — M6281 Muscle weakness (generalized): Secondary | ICD-10-CM | POA: Diagnosis not present

## 2016-11-13 NOTE — Telephone Encounter (Signed)
[name] is calling to let md know pt is refusing home health, OT and ST services due to unable to afford copay

## 2016-11-13 NOTE — Telephone Encounter (Signed)
Please advise 

## 2016-11-13 NOTE — Telephone Encounter (Signed)
noted 

## 2016-11-15 ENCOUNTER — Telehealth: Payer: Self-pay | Admitting: Internal Medicine

## 2016-11-15 NOTE — Telephone Encounter (Signed)
FYI francesa is calling pt will be discharge from PT due to copay

## 2016-11-15 NOTE — Telephone Encounter (Signed)
Please advise 

## 2016-11-18 ENCOUNTER — Telehealth: Payer: Self-pay | Admitting: Internal Medicine

## 2016-11-18 NOTE — Telephone Encounter (Signed)
[name] with [facility] states the pt has refused ST because she has a copay and does not have the money

## 2016-11-19 NOTE — Telephone Encounter (Signed)
Please note

## 2016-11-26 ENCOUNTER — Telehealth: Payer: Self-pay

## 2016-11-26 NOTE — Telephone Encounter (Signed)
[name] with [facility] called to report that pt has declined further home health services due to financial constraints.   Dr. Raliegh Ip - Juluis Rainier

## 2016-12-05 DIAGNOSIS — I5032 Chronic diastolic (congestive) heart failure: Secondary | ICD-10-CM | POA: Diagnosis not present

## 2016-12-05 DIAGNOSIS — I10 Essential (primary) hypertension: Secondary | ICD-10-CM | POA: Diagnosis not present

## 2016-12-05 DIAGNOSIS — I2510 Atherosclerotic heart disease of native coronary artery without angina pectoris: Secondary | ICD-10-CM | POA: Diagnosis not present

## 2016-12-05 DIAGNOSIS — I481 Persistent atrial fibrillation: Secondary | ICD-10-CM | POA: Diagnosis not present

## 2016-12-11 ENCOUNTER — Encounter: Payer: Self-pay | Admitting: Internal Medicine

## 2016-12-18 DIAGNOSIS — H5022 Vertical strabismus, left eye: Secondary | ICD-10-CM | POA: Diagnosis not present

## 2016-12-18 DIAGNOSIS — Z961 Presence of intraocular lens: Secondary | ICD-10-CM | POA: Diagnosis not present

## 2016-12-18 DIAGNOSIS — H532 Diplopia: Secondary | ICD-10-CM | POA: Diagnosis not present

## 2016-12-18 DIAGNOSIS — H04123 Dry eye syndrome of bilateral lacrimal glands: Secondary | ICD-10-CM | POA: Diagnosis not present

## 2016-12-25 ENCOUNTER — Other Ambulatory Visit: Payer: Self-pay

## 2016-12-25 ENCOUNTER — Ambulatory Visit (AMBULATORY_SURGERY_CENTER): Payer: PPO | Admitting: Internal Medicine

## 2016-12-25 ENCOUNTER — Other Ambulatory Visit (INDEPENDENT_AMBULATORY_CARE_PROVIDER_SITE_OTHER): Payer: PPO

## 2016-12-25 ENCOUNTER — Encounter: Payer: Self-pay | Admitting: Internal Medicine

## 2016-12-25 VITALS — BP 129/96 | HR 83 | Temp 98.0°F | Resp 20 | Ht 63.5 in | Wt 186.0 lb

## 2016-12-25 DIAGNOSIS — I10 Essential (primary) hypertension: Secondary | ICD-10-CM | POA: Diagnosis not present

## 2016-12-25 DIAGNOSIS — R112 Nausea with vomiting, unspecified: Secondary | ICD-10-CM | POA: Diagnosis not present

## 2016-12-25 DIAGNOSIS — D123 Benign neoplasm of transverse colon: Secondary | ICD-10-CM

## 2016-12-25 DIAGNOSIS — D125 Benign neoplasm of sigmoid colon: Secondary | ICD-10-CM | POA: Diagnosis not present

## 2016-12-25 DIAGNOSIS — J449 Chronic obstructive pulmonary disease, unspecified: Secondary | ICD-10-CM | POA: Diagnosis not present

## 2016-12-25 DIAGNOSIS — D509 Iron deficiency anemia, unspecified: Secondary | ICD-10-CM

## 2016-12-25 DIAGNOSIS — Z1211 Encounter for screening for malignant neoplasm of colon: Secondary | ICD-10-CM | POA: Diagnosis not present

## 2016-12-25 DIAGNOSIS — K449 Diaphragmatic hernia without obstruction or gangrene: Secondary | ICD-10-CM | POA: Diagnosis not present

## 2016-12-25 DIAGNOSIS — I272 Pulmonary hypertension, unspecified: Secondary | ICD-10-CM | POA: Diagnosis not present

## 2016-12-25 DIAGNOSIS — I2510 Atherosclerotic heart disease of native coronary artery without angina pectoris: Secondary | ICD-10-CM | POA: Diagnosis not present

## 2016-12-25 DIAGNOSIS — R1013 Epigastric pain: Secondary | ICD-10-CM | POA: Diagnosis not present

## 2016-12-25 DIAGNOSIS — I4891 Unspecified atrial fibrillation: Secondary | ICD-10-CM | POA: Diagnosis not present

## 2016-12-25 DIAGNOSIS — Z8673 Personal history of transient ischemic attack (TIA), and cerebral infarction without residual deficits: Secondary | ICD-10-CM | POA: Diagnosis not present

## 2016-12-25 DIAGNOSIS — K219 Gastro-esophageal reflux disease without esophagitis: Secondary | ICD-10-CM | POA: Diagnosis not present

## 2016-12-25 LAB — CBC WITH DIFFERENTIAL/PLATELET
Basophils Absolute: 0 10*3/uL (ref 0.0–0.1)
Basophils Relative: 0.5 % (ref 0.0–3.0)
Eosinophils Absolute: 0.1 10*3/uL (ref 0.0–0.7)
Eosinophils Relative: 0.8 % (ref 0.0–5.0)
HCT: 32.1 % — ABNORMAL LOW (ref 36.0–46.0)
Hemoglobin: 9.8 g/dL — ABNORMAL LOW (ref 12.0–15.0)
Lymphocytes Relative: 20.7 % (ref 12.0–46.0)
Lymphs Abs: 1.3 10*3/uL (ref 0.7–4.0)
MCHC: 30.4 g/dL (ref 30.0–36.0)
MCV: 73 fl — ABNORMAL LOW (ref 78.0–100.0)
Monocytes Absolute: 0.6 10*3/uL (ref 0.1–1.0)
Monocytes Relative: 10.3 % (ref 3.0–12.0)
Neutro Abs: 4.3 10*3/uL (ref 1.4–7.7)
Neutrophils Relative %: 67.7 % (ref 43.0–77.0)
Platelets: 150 10*3/uL (ref 150.0–400.0)
RBC: 4.4 Mil/uL (ref 3.87–5.11)
RDW: 18.5 % — ABNORMAL HIGH (ref 11.5–15.5)
WBC: 6.3 10*3/uL (ref 4.0–10.5)

## 2016-12-25 MED ORDER — SODIUM CHLORIDE 0.9 % IV SOLN: 500.0000 mL | INTRAVENOUS | Status: DC

## 2016-12-25 NOTE — Op Note (Signed)
[name] Patient Name: [name] Procedure Date: 12/25/2016 8:24 AM MRN: [id number] Endoscopist: Docia Chuck. Henrene Pastor , MD Age: 83 Referring MD:  Date of Birth: 10/16/1929 Gender: Female Account #: [id number] Procedure:                Upper GI endoscopy Indications:              Epigastric abdominal pain, Iron deficiency anemia,                            Nausea with vomiting Medicines:                Monitored Anesthesia Care Procedure:                Pre-Anesthesia Assessment:                           - Prior to the procedure, a History and Physical                            was performed, and patient medications and                            allergies were reviewed. The patient's tolerance of                            previous anesthesia was also reviewed. The risks                            and benefits of the procedure and the sedation                            options and risks were discussed with the patient.                            All questions were answered, and informed consent                            was obtained. Prior Anticoagulants: The patient has                            taken Eliquis (apixaban), last dose was 3 days                            prior to procedure. ASA Grade Assessment: III - A                            patient with severe systemic disease. After                            reviewing the risks and benefits, the patient was                            deemed in satisfactory condition to undergo the  procedure.                           After obtaining informed consent, the endoscope was                            passed under direct vision. Throughout the                            procedure, the patient's blood pressure, pulse, and                            oxygen saturations were monitored continuously. The                            Model GIF-[id number] scope was introduced       through the mouth, and advanced to the fourth part                            of duodenum. The upper GI endoscopy was                            accomplished without difficulty. The patient                            tolerated the procedure well. Scope In: Scope Out: Findings:                 The esophagus was normal.                           The stomach was normal. Moderate size hiatal hernia                            without complicating features or erosions.                           The examined duodenum was normal.                           The cardia and gastric fundus were normal on                            retroflexion, save hiatal hernia. Complications:            No immediate complications. Estimated Blood Loss:     Estimated blood loss: none. Impression:               - Normal EGD with moderate uncomplicated hiatal                            hernia                           - GERD. Recommendation:           1. Omeprazole or Nexium 40 mg twice daily  2. New iron supplement twice daily                           3. CBC today                           4. Keep follow-up hematology appointment with Dr.                            Burr Medico for ongoing management and monitoring of your                            anemia                           5. GI follow-up as needed.                           6. Resume Eliquis today [name] N. Henrene Pastor, MD 12/25/2016 9:06:09 AM This report has been signed electronically.

## 2016-12-25 NOTE — Patient Instructions (Addendum)
Discharge instructions given. Handouts on polyps and hemorrhoids. Resume previous medication. YOU HAD AN ENDOSCOPIC PROCEDURE TODAY AT [facility]:   Refer to the procedure report that was given to you for any specific questions about what was found during the examination.  If the procedure report does not answer your questions, please call your gastroenterologist to clarify.  If you requested that your care partner not be given the details of your procedure findings, then the procedure report has been included in a sealed envelope for you to review at your convenience later.  YOU SHOULD EXPECT: Some feelings of bloating in the abdomen. Passage of more gas than usual.  Walking can help get rid of the air that was put into your GI tract during the procedure and reduce the bloating. If you had a lower endoscopy (such as a colonoscopy or flexible sigmoidoscopy) you Novacek notice spotting of blood in your stool or on the toilet paper. If you underwent a bowel prep for your procedure, you Mehl not have a normal bowel movement for a few days.  Please Note:  You might notice some irritation and congestion in your nose or some drainage.  This is from the oxygen used during your procedure.  There is no need for concern and it should clear up in a day or so.  SYMPTOMS TO REPORT IMMEDIATELY:   Following lower endoscopy (colonoscopy or flexible sigmoidoscopy):  Excessive amounts of blood in the stool  Significant tenderness or worsening of abdominal pains  Swelling of the abdomen that is new, acute  Fever of 100F or higher   For urgent or emergent issues, a gastroenterologist can be reached at any hour by calling [phone].   DIET:  We do recommend a small meal at first, but then you Hole proceed to your regular diet.  Drink plenty of fluids but you should avoid alcoholic beverages for 24 hours.  ACTIVITY:  You should plan to take it easy for the rest of today and you should NOT DRIVE  or use heavy machinery until tomorrow (because of the sedation medicines used during the test).    FOLLOW UP: Our staff will call the number listed on your records the next business day following your procedure to check on you and address any questions or concerns that you Naraine have regarding the information given to you following your procedure. If we do not reach you, we will leave a message.  However, if you are feeling well and you are not experiencing any problems, there is no need to return our call.  We will assume that you have returned to your regular daily activities without incident.  If any biopsies were taken you will be contacted by phone or by letter within the next 1-3 weeks.  Please call us at [phone] if you have not heard about the biopsies in 3 weeks.    SIGNATURES/CONFIDENTIALITY: You and/or your care partner have signed paperwork which will be entered into your electronic medical record.  These signatures attest to the fact that that the information above on your After Visit Summary has been reviewed and is understood.  Full responsibility of the confidentiality of this discharge information lies with you and/or your care-partner.YOU HAD AN ENDOSCOPIC PROCEDURE TODAY AT [facility]:   Refer to the procedure report that was given to you for any specific questions about what was found during the examination.  If the procedure report does not answer your questions, please call  your gastroenterologist to clarify.  If you requested that your care partner not be given the details of your procedure findings, then the procedure report has been included in a sealed envelope for you to review at your convenience later.  YOU SHOULD EXPECT: Some feelings of bloating in the abdomen. Passage of more gas than usual.  Walking can help get rid of the air that was put into your GI tract during the procedure and reduce the bloating. If you had a lower endoscopy (such as a  colonoscopy or flexible sigmoidoscopy) you [place] notice spotting of blood in your stool or on the toilet paper. If you underwent a bowel prep for your procedure, you Suh not have a normal bowel movement for a few days.  Please Note:  You might notice some irritation and congestion in your nose or some drainage.  This is from the oxygen used during your procedure.  There is no need for concern and it should clear up in a day or so.  SYMPTOMS TO REPORT IMMEDIATELY:   Following lower endoscopy (colonoscopy or flexible sigmoidoscopy):  Excessive amounts of blood in the stool  Significant tenderness or worsening of abdominal pains  Swelling of the abdomen that is new, acute  Fever of 100F or higher   Following upper endoscopy (EGD)  Vomiting of blood or coffee ground material  New chest pain or pain under the shoulder blades  Painful or persistently difficult swallowing  New shortness of breath  Fever of 100F or higher  Black, tarry-looking stools  For urgent or emergent issues, a gastroenterologist can be reached at any hour by calling [phone].   DIET:  We do recommend a small meal at first, but then you Wenzl proceed to your regular diet.  Drink plenty of fluids but you should avoid alcoholic beverages for 24 hours.  ACTIVITY:  You should plan to take it easy for the rest of today and you should NOT DRIVE or use heavy machinery until tomorrow (because of the sedation medicines used during the test).    FOLLOW UP: Our staff will call the number listed on your records the next business day following your procedure to check on you and address any questions or concerns that you Leather have regarding the information given to you following your procedure. If we do not reach you, we will leave a message.  However, if you are feeling well and you are not experiencing any problems, there is no need to return our call.  We will assume that you have returned to your regular daily activities without  incident.  If any biopsies were taken you will be contacted by phone or by letter within the next 1-3 weeks.  Please call us at [phone] if you have not heard about the biopsies in 3 weeks.    SIGNATURES/CONFIDENTIALITY: You and/or your care partner have signed paperwork which will be entered into your electronic medical record.  These signatures attest to the fact that that the information above on your After Visit Summary has been reviewed and is understood.  Full responsibility of the confidentiality of this discharge information lies with you and/or your care-partner.

## 2016-12-25 NOTE — Progress Notes (Signed)
Called to room to assist during endoscopic procedure.  Patient ID and intended procedure confirmed with present staff. Received instructions for my participation in the procedure from the performing physician.  

## 2016-12-25 NOTE — Op Note (Signed)
[place] Patient Name: [name] Procedure Date: 12/25/2016 8:24 AM MRN: [id number] Endoscopist: Docia Chuck. Henrene Pastor , MD Age: 83 Referring MD:  Date of Birth: Sep 14, 1929 Gender: Female Account #: [id number] Procedure:                Colonoscopy, with cold snare polypectomy X2 Indications:              Iron deficiency anemia. Patient with a personal                            history of colon cancer status post right                            hemicolectomy November 2015 Medicines:                Monitored Anesthesia Care Procedure:                Pre-Anesthesia Assessment:                           - Prior to the procedure, a History and Physical                            was performed, and patient medications and                            allergies were reviewed. The patient's tolerance of                            previous anesthesia was also reviewed. The risks                            and benefits of the procedure and the sedation                            options and risks were discussed with the patient.                            All questions were answered, and informed consent                            was obtained. Prior Anticoagulants: The patient has                            taken Eliquis (apixaban), last dose was 3 days                            prior to procedure. ASA Grade Assessment: III - A                            patient with severe systemic disease. After                            reviewing the risks and benefits, the patient was  deemed in satisfactory condition to undergo the                            procedure.                           After obtaining informed consent, the colonoscope                            was passed under direct vision. Throughout the                            procedure, the patient's blood pressure, pulse, and                            oxygen saturations were monitored continuously. The                             Colonoscope was introduced through the anus and                            advanced to the the surgical anastomosis. The                            rectum was photographed. The quality of the bowel                            preparation was excellent. The colonoscopy was                            performed without difficulty. The patient tolerated                            the procedure well. The bowel preparation used was                            SUPREP. Scope In: 8:32:32 AM Scope Out: 8:41:06 AM Scope Withdrawal Time: 0 hours 6 minutes 36 seconds  Total Procedure Duration: 0 hours 8 minutes 34 seconds  Findings:                 Two polyps were found in the sigmoid colon and                            transverse colon. The polyps were 3 to 5 mm in                            size. These polyps were removed with a cold snare.                            Resection and retrieval were complete.                           Internal hemorrhoids were found during retroflexion.  There was evidence of prior right hemicolectomy                            with unremarkable anastomosis and neo-ileum. In                            addition, segmental resection in the rectosigmoid                            region with unremarkable.The exam was otherwise                            without abnormality on direct and retroflexion                            views. Complications:            No immediate complications. Estimated blood loss:                            None. Estimated Blood Loss:     Estimated blood loss: none. Impression:               - Two 3 to 5 mm polyps in the sigmoid colon and in                            the transverse colon, removed with a cold snare.                            Resected and retrieved.                           - Internal hemorrhoids.                           - Prior surgeries as described.The examination was                             otherwise normal on direct and retroflexion views. Recommendation:           - Repeat colonoscopy is not recommended for                            surveillance.                           - EGD today. Please see report.                           - Resume previous diet.                           - Continue present medications. Resume Eliquis                            today.                           -  Await pathology results. Docia Chuck. Henrene Pastor, MD 12/25/2016 8:57:55 AM This report has been signed electronically.

## 2016-12-25 NOTE — Progress Notes (Signed)
Patient to lab after discharged. 

## 2016-12-25 NOTE — Progress Notes (Signed)
Report to PACU, RN, vss, BBS= Clear.  

## 2016-12-25 NOTE — Progress Notes (Signed)
Pt's states no medical or surgical changes since previsit or office visit. Maw  Pt has reddish colored bruise on her left forearm with a small bain-aid on top of large bruise.  Pt also has a bruise to top of her left hand.  Pt staates, "I have a dog and she is afraid of storms, she scratches my skin is why I have these bruises.  maw

## 2016-12-26 ENCOUNTER — Telehealth: Payer: Self-pay

## 2016-12-26 NOTE — Telephone Encounter (Signed)
  Follow up Call-  Call back number 12/25/2016 07/25/2015  Post procedure Call Back phone  # [phone] hm [phone] hm  Permission to leave phone message Yes Yes  comments - Joneen Caraway - husband will have to call   Some recent data might be hidden     Patient questions:  Do you have a fever, pain , or abdominal swelling? No. Pain Score  0 *  Have you tolerated food without any problems? Yes.    Have you been able to return to your normal activities? Yes.    Do you have any questions about your discharge instructions: Diet   No. Medications  No. Follow up visit  No.  Do you have questions or concerns about your Care? No.  Actions: * If pain score is 4 or above: No action needed, pain <4.

## 2016-12-27 ENCOUNTER — Ambulatory Visit (INDEPENDENT_AMBULATORY_CARE_PROVIDER_SITE_OTHER): Payer: PPO | Admitting: Physician Assistant

## 2016-12-27 ENCOUNTER — Ambulatory Visit (INDEPENDENT_AMBULATORY_CARE_PROVIDER_SITE_OTHER): Payer: PPO

## 2016-12-27 DIAGNOSIS — S8261XD Displaced fracture of lateral malleolus of right fibula, subsequent encounter for closed fracture with routine healing: Secondary | ICD-10-CM | POA: Insufficient documentation

## 2016-12-27 DIAGNOSIS — M25571 Pain in right ankle and joints of right foot: Secondary | ICD-10-CM

## 2016-12-27 MED ORDER — HYDROCODONE-ACETAMINOPHEN 5-325 MG PO TABS: 1.0000 | ORAL_TABLET | Freq: Four times a day (QID) | ORAL | 0 refills | Status: DC | PRN

## 2016-12-27 NOTE — Progress Notes (Signed)
Office Visit Note   Patient: [name]           Date of Birth: [birth year]           MRN: [id number] Visit Date: 12/27/2016              Requested by: Marletta Lor, MD [place], [place] [address] PCP: Marletta Lor, MD   Assessment & Plan: Visit Diagnoses:  1. Pain in right ankle and joints of right foot   2. Displaced fracture of lateral malleolus of right fibula, subsequent encounter for closed fracture with routine healing     Plan: We'll place her in a Cam Walker boot touchdown weightbearing on the right. She is to treat the [employer] like a cast only coming out of it for hygiene purposes. Like to see her back in 2 weeks obtain 3 views of the right ankle to monitor the overall position alignment. Did discuss with her if this moves considerably [place] have to consider surgical procedure which would be an open reduction internal fixation of the right malleolus. Elevation when wiggling of toes encouraged. She is on Eliquis with a history of a previous DVT .  Follow-Up Instructions: Return in about 2 weeks (around 01/10/2017) for Radiographs.   Orders:  Orders Placed This Encounter  Procedures  . XR Ankle Complete Right   Meds ordered this encounter  Medications  . HYDROcodone-acetaminophen (NORCO/VICODIN) 5-325 MG tablet    Sig: Take 1-2 tablets by mouth every 6 (six) hours as needed for moderate pain.    Dispense:  40 tablet    Refill:  0      Procedures: No procedures performed   Clinical Data: No additional findings.   Subjective: Right ankle pain  HPI [name] is a 83 year old female is patient of Dr. deans. She comes in today due to the fact that she twisted her ankle yesterday morning bathroom. States she did not have any chest pain shortness breath dizziness lightheadedness. She only inches ankle she did not fall. She's having pain bearing weight on the right ankle. Notes that the ankles very swollen. Review of  Systems Denies chest pain shortness breath fevers chills syncope dizziness or lightheadedness  Objective: Vital Signs: There were no vitals taken for this visit.  Physical Exam  Constitutional: She is oriented to person, place, and time. She appears well-developed and well-nourished. No distress.  Cardiovascular: Intact distal pulses.   Neurological: She is alert and oriented to person, place, and time.  Skin: She is not diaphoretic.  Psychiatric: She has a normal mood and affect. Her behavior is normal.    Ortho Exam Right calf supple. No tenderness proximal tib-fib. No tenderness over the medial malleolus. The Achilles is intact and nontender. Tenderness over the lateral malleolus. She has global swelling about the ankle. No rashes skin lesions ulcerations erythema or ecchymosis. Specialty Comments:  No specialty comments available.  Imaging: Xr Ankle Complete Right  Result Date: 12/27/2016 3 views right ankle: Talus well located within the ankle mortise. Weber B equivalent comminuted mildly displaced fracture of the fibula. No other fracture fractures identified    PMFS History: Patient Active Problem List   Diagnosis Date Noted  . Displaced fracture of lateral malleolus of right fibula, subsequent encounter for closed fracture with routine healing 12/27/2016  . Abdominal pain, epigastric 11/12/2016  . Nausea and vomiting 11/12/2016  . Iron deficiency anemia 11/12/2016  . Chronic anticoagulation 11/12/2016  . Chronic atrial  fibrillation (HCC) 08/05/2016  . Coronary artery disease of native artery of native heart with stable angina pectoris (HCC) 08/05/2016  . Persistent atrial fibrillation (HCC) 08/25/2015  . Failed total left knee replacement (HCC) 08/02/2015  . Sigmoid diverticulitis 11/24/2014  . Acute blood loss anemia 06/13/2014  . Neuropathic pain 06/13/2014  . Insomnia 06/13/2014  . Anxiety and depression 06/13/2014  . Cancer of ascending colon (HCC) 06/13/2014   . Small bowel obstruction (HCC) 05/31/2014  . Colovaginal fistula 04/20/2014  . Lethargy 09/15/2013  . DEPRESSION 02/07/2010  . DIASTOLIC HEART FAILURE, CHRONIC 02/27/2009  . PURE HYPERCHOLESTEROLEMIA 04/05/2008  . Coronary atherosclerosis 04/05/2008  . Osteoarthritis 05/19/2007  . Essential hypertension 01/21/2007  . GERD 01/21/2007  . Hiatal hernia 01/21/2007  . Diverticulosis of large intestine 01/21/2007  . COLONIC POLYPS, HX OF 01/21/2007   Past Medical History:  Diagnosis Date  . Adenocarcinoma, colon [facility]) Oncologist-- Dr Malachy Mood   Multifocal (2) Colon cancer @ ileocecal valve and ascending ( mT4N1Mx), Stage IIIB, grade I, MMR normal , 2 of 16 +lymph nodes, negative surgical margins---  06-02-2014  Right hemicolectomy w/ colostomy  . Allergy   . Anxiety   . Asthma    inhaler "sometimes"  . CAD (coronary artery disease)    a. LHC 4/09: pLAD 40, mDx 70-75, pLCx 30, mLCx 50, mRCA 90, EF 60% >> PCI: BMS x2 to RCA;  b. Myoview 8/12: normal  . Cataract    bil cateracts removed  . Chronic anemia   . Chronic diastolic CHF (congestive heart failure) (HCC)    a. Echo 912 - Mild LVH, EF 55-60%, no RWMA, Gr 1 DD, mild MR, mild LAE, mild RAE, PASP 59 mmHg (mod to severe pulmo HTN)  . Clotting disorder (HCC)    hx of dvt, tia on eliquis  . Colostomy in place [facility])    s/p colostomy takedown 5/16  . Colovaginal fistula    s/p colostomy >> colostomy takedown 5/16  . COPD with chronic bronchitis (HCC)   . Depression   . Dysrhythmia    A fib  . Essential hypertension   . GERD (gastroesophageal reflux disease)   . History of adenomatous polyp of colon   . History of blood transfusion   . History of DVT of lower extremity   . History of hiatal hernia   . History of TIA (transient ischemic attack)    a. Head CT 6/15: small chronic lacunar infarct in thalamus  . Hyperlipidemia   . OA (osteoarthritis)   . Osteoporosis   . Peripheral neuropathy   . Pneumonia   . Pulmonary HTN  (HCC)    a. PASP on Echo in 9/12:  59 mmHg  . Renal insufficiency   . Sigmoid diverticulosis    s/p sigmoid colectomy  . Stroke (HCC)    TIAs  . Wears dentures   . Wears glasses     Family History  Problem Relation Age of Onset  . Osteoarthritis Mother   . Heart failure Mother   . Colon cancer Maternal Uncle 70  . Heart Problems Father   . Cancer Sister 20       ? colon cancer   . Kidney cancer Brother 30  . Esophageal cancer Neg Hx   . Rectal cancer Neg Hx   . Stomach cancer Neg Hx   . Pancreatic cancer Neg Hx     Past Surgical History:  Procedure Laterality Date  . ABDOMINAL HYSTERECTOMY    . ANTERIOR CERVICAL DECOMP/DISCECTOMY  FUSION  01-31-2009   C5 -- 7  . APPENDECTOMY    . Bone spurs Bilateral    Feet  . CARDIOVASCULAR STRESS TEST  02-26-2011   Normal lexiscan no exercise study/  no ischemia/  normal LV function and wall motion, ef 67%  . CARPAL TUNNEL RELEASE Right   . CATARACT EXTRACTION W/ INTRAOCULAR LENS  IMPLANT, BILATERAL Bilateral   . CHOLECYSTECTOMY    . COLOSTOMY N/A 06/02/2014   Procedure: DIVERTING DESCENDING END COLOSTOMY;  Surgeon: Donnie Mesa, MD;  Location: [place];  Service: General;  Laterality: N/A;  . CORONARY ANGIOPLASTY WITH STENT PLACEMENT  11-04-2007  dr bensimhon   BMS x2 to RCA/  mild Non-obstructive disease LAD/  normal LVF  . DILATION AND CURETTAGE OF UTERUS    . EVALUATION UNDER ANESTHESIA WITH ANAL FISTULECTOMY N/A 10/13/2014   Procedure: ANAL EXAM UNDER ANESTHESIA ;  Surgeon: [name], MD;  [facility];  Service: General;  Laterality: N/A;  . HAND SURGERY     Tendon repair  . LAPAROSCOPIC SIGMOID COLECTOMY N/A 11/24/2014   Procedure: SIGMOID COLECTOMY AND COLSOTOMY CLOSURE;  Surgeon: Donnie Mesa, MD;  Location: [place];  Service: General;  Laterality: N/A;  . LUMBAR LAMINECTOMY  10-29-2002,  1969   Left  L3 -- 4  decompression  . PARTIAL COLECTOMY N/A 06/02/2014   Procedure: RIGHT PARTIAL COLECTOMY ;  Surgeon: Donnie Mesa, MD;  Location: [place];  Service: General;  Laterality: N/A;  . PATELLECTOMY Right 09/14/2013   Procedure: PATELLECTOMY;  Surgeon: Meredith Pel, MD;  Location: [place];  Service: Orthopedics;  Laterality: Right;  . PROCTOSCOPY N/A 10/13/2014   Procedure: RIDGE PROCTOSCOPY;  Surgeon: [name], MD;  [facility];  Service: General;  Laterality: N/A;  . REVISION TOTAL KNEE ARTHROPLASTY Bilateral right  04-02-2011/  left 1996 & 10-05-1999  . ROTATOR CUFF REPAIR Bilateral   . TONSILLECTOMY    . TOTAL KNEE ARTHROPLASTY Bilateral left 1994/  right 2000  . TOTAL KNEE REVISION Left 08/02/2015   Procedure: LEFT FEMORAL REVISION;  Surgeon: Gaynelle Arabian, MD;  [facility];  Service: Orthopedics;  Laterality: Left;  . TRANSTHORACIC ECHOCARDIOGRAM  12-01-2009   Grade I diastolic dysfunction/  ef 60%/  moderate MR/  mild TR   Social History   Occupational History  . Not on file.   Social History Main Topics  . Smoking status: Former Smoker    Packs/day: 0.50    Years: 15.00    Types: Cigarettes    Quit date: 07/16/1967  . Smokeless tobacco: Never Used  . Alcohol use No  . Drug use: No  . Sexual activity: Not on file

## 2017-01-01 ENCOUNTER — Encounter: Payer: Self-pay | Admitting: Internal Medicine

## 2017-01-09 ENCOUNTER — Ambulatory Visit (INDEPENDENT_AMBULATORY_CARE_PROVIDER_SITE_OTHER): Payer: PPO | Admitting: Physician Assistant

## 2017-01-13 ENCOUNTER — Ambulatory Visit (INDEPENDENT_AMBULATORY_CARE_PROVIDER_SITE_OTHER): Payer: PPO | Admitting: Physician Assistant

## 2017-01-13 ENCOUNTER — Ambulatory Visit (INDEPENDENT_AMBULATORY_CARE_PROVIDER_SITE_OTHER): Payer: PPO

## 2017-01-13 DIAGNOSIS — S82891D Other fracture of right lower leg, subsequent encounter for closed fracture with routine healing: Secondary | ICD-10-CM

## 2017-01-13 MED ORDER — HYDROCODONE-ACETAMINOPHEN 5-325 MG PO TABS: 1.0000 | ORAL_TABLET | Freq: Four times a day (QID) | ORAL | 0 refills | Status: DC | PRN

## 2017-01-13 NOTE — Progress Notes (Signed)
Office Visit Note   Patient: [name]           Date of Birth: [name] 20, 1931           MRN: [id number] Visit Date: 01/13/2017              Requested by: Marletta Lor, MD [place], [place] [id number] PCP: Marletta Lor, MD   Assessment & Plan: Visit Diagnoses:  1. Closed fracture of right ankle with routine healing, subsequent encounter     Plan:She is weightbearing as tolerated in a cam walker boot. She'll begin working on gentle range of motion of the ankle. We'll see her back in 1 month obtain AP lateral oblique views of the right ankle at that time most likely transition her to an ASO brace.  Follow-Up Instructions: Return in about 4 weeks (around 02/10/2017) for Radiographs.   Orders:  Orders Placed This Encounter  Procedures  . XR Ankle Complete Right   Meds ordered this encounter  Medications  . HYDROcodone-acetaminophen (NORCO/VICODIN) 5-325 MG tablet    Sig: Take 1-2 tablets by mouth every 6 (six) hours as needed for moderate pain.    Dispense:  40 tablet    Refill:  0      Procedures: No procedures performed   Clinical Data: No additional findings.   Subjective: Chief Complaint  Patient presents with  . Right Ankle - Follow-up, Fracture    HPI [name] returns to half weeks status post right ankle lateral malleolus fracture. She is overall doing well states that the pain is dissipating. States pain is worse at night. She's had no new injuries. Review of Systems   Objective: Vital Signs: There were no vitals taken for this visit.  Physical Exam  Ortho Exam Right calf supple nontender. Dorsal pedal pulses intact. Tenderness over the lateral malleolus only. No tenderness over the medial malleolus deltoid region. No skin breakdown rashes or edema.   Specialty Comments:  No specialty comments available.  Imaging: No results found.   PMFS History: Patient Active Problem List   Diagnosis Date Noted  .  Displaced fracture of lateral malleolus of right fibula, subsequent encounter for closed fracture with routine healing 12/27/2016  . Abdominal pain, epigastric 11/12/2016  . Nausea and vomiting 11/12/2016  . Iron deficiency anemia 11/12/2016  . Chronic anticoagulation 11/12/2016  . Chronic atrial fibrillation ([place]) 08/05/2016  . Coronary artery disease of native artery of native heart with stable angina pectoris ([place]) 08/05/2016  . Persistent atrial fibrillation ([place]) 08/25/2015  . Failed total left knee replacement ([place]) 08/02/2015  . Sigmoid diverticulitis 11/24/2014  . Acute blood loss anemia 06/13/2014  . Neuropathic pain 06/13/2014  . Insomnia 06/13/2014  . Anxiety and depression 06/13/2014  . Cancer of ascending colon ([place]) 06/13/2014  . Small bowel obstruction ([place]) 05/31/2014  . Colovaginal fistula 04/20/2014  . Lethargy 09/15/2013  . DEPRESSION 02/07/2010  . DIASTOLIC HEART FAILURE, CHRONIC 02/27/2009  . PURE HYPERCHOLESTEROLEMIA 04/05/2008  . Coronary atherosclerosis 04/05/2008  . Osteoarthritis 05/19/2007  . Essential hypertension 01/21/2007  . GERD 01/21/2007  . Hiatal hernia 01/21/2007  . Diverticulosis of large intestine 01/21/2007  . COLONIC POLYPS, HX OF 01/21/2007   Past Medical History:  Diagnosis Date  . Adenocarcinoma, colon [facility]) Oncologist-- Dr Truitt Merle   Multifocal (2) Colon cancer @ ileocecal valve and ascending ( mT4N1Mx), Stage IIIB, grade I, MMR normal , 2 of 16 +lymph nodes, negative surgical margins---  06-02-2014  Right hemicolectomy  w/ colostomy  . Allergy   . Anxiety   . Asthma    inhaler "sometimes"  . CAD (coronary artery disease)    a. LHC 4/09: pLAD 40, mDx 70-75, pLCx 30, mLCx 50, mRCA 90, EF 60% >> PCI: BMS x2 to RCA;  b. Myoview 8/12: normal  . Cataract    bil cateracts removed  . Chronic anemia   . Chronic diastolic CHF (congestive heart failure) (HCC)    a. Echo 912 - Mild LVH, EF 55-60%, no RWMA, Gr 1 DD, mild MR, mild LAE, mild  RAE, PASP 59 mmHg (mod to severe pulmo HTN)  . Clotting disorder (HCC)    hx of dvt, tia on eliquis  . Colostomy in place [facility])    s/p colostomy takedown 5/16  . Colovaginal fistula    s/p colostomy >> colostomy takedown 5/16  . COPD with chronic bronchitis ([place])   . Depression   . Dysrhythmia    A fib  . Essential hypertension   . GERD (gastroesophageal reflux disease)   . History of adenomatous polyp of colon   . History of blood transfusion   . History of DVT of lower extremity   . History of hiatal hernia   . History of TIA (transient ischemic attack)    a. Head CT 6/15: small chronic lacunar infarct in thalamus  . Hyperlipidemia   . OA (osteoarthritis)   . Osteoporosis   . Peripheral neuropathy   . Pneumonia   . Pulmonary HTN ([place])    a. PASP on Echo in 9/12:  59 mmHg  . Renal insufficiency   . Sigmoid diverticulosis    s/p sigmoid colectomy  . Stroke ([place])    TIAs  . Wears dentures   . Wears glasses     Family History  Problem Relation Age of Onset  . Osteoarthritis Mother   . Heart failure Mother   . Colon cancer Maternal Uncle 39  . Heart Problems Father   . Cancer Sister 22       ? colon cancer   . Kidney cancer Brother 20  . Esophageal cancer Neg Hx   . Rectal cancer Neg Hx   . Stomach cancer Neg Hx   . Pancreatic cancer Neg Hx     Past Surgical History:  Procedure Laterality Date  . ABDOMINAL HYSTERECTOMY    . ANTERIOR CERVICAL DECOMP/DISCECTOMY FUSION  01-31-2009   C5 -- 7  . APPENDECTOMY    . Bone spurs Bilateral    Feet  . CARDIOVASCULAR STRESS TEST  02-26-2011   Normal lexiscan no exercise study/  no ischemia/  normal LV function and wall motion, ef 67%  . CARPAL TUNNEL RELEASE Right   . CATARACT EXTRACTION W/ INTRAOCULAR LENS  IMPLANT, BILATERAL Bilateral   . CHOLECYSTECTOMY    . COLOSTOMY N/A 06/02/2014   Procedure: DIVERTING DESCENDING END COLOSTOMY;  Surgeon: Donnie Mesa, MD;  Location: [place];  Service: General;  Laterality: N/A;  .  CORONARY ANGIOPLASTY WITH STENT PLACEMENT  11-04-2007  dr bensimhon   BMS x2 to RCA/  mild Non-obstructive disease LAD/  normal LVF  . DILATION AND CURETTAGE OF UTERUS    . EVALUATION UNDER ANESTHESIA WITH ANAL FISTULECTOMY N/A 10/13/2014   Procedure: ANAL EXAM UNDER ANESTHESIA ;  Surgeon: [name], MD;  [facility];  Service: General;  Laterality: N/A;  . HAND SURGERY     Tendon repair  . LAPAROSCOPIC SIGMOID COLECTOMY N/A 11/24/2014   Procedure: SIGMOID COLECTOMY AND  COLSOTOMY CLOSURE;  Surgeon: Donnie Mesa, MD;  Location: [place];  Service: General;  Laterality: N/A;  . LUMBAR LAMINECTOMY  10-29-2002,  1969   Left  L3 -- 4  decompression  . PARTIAL COLECTOMY N/A 06/02/2014   Procedure: RIGHT PARTIAL COLECTOMY ;  Surgeon: Donnie Mesa, MD;  Location: [place];  Service: General;  Laterality: N/A;  . PATELLECTOMY Right 09/14/2013   Procedure: PATELLECTOMY;  Surgeon: Meredith Pel, MD;  Location: [place];  Service: Orthopedics;  Laterality: Right;  . PROCTOSCOPY N/A 10/13/2014   Procedure: RIDGE PROCTOSCOPY;  Surgeon: [name], MD;  [facility];  Service: General;  Laterality: N/A;  . REVISION TOTAL KNEE ARTHROPLASTY Bilateral right  04-02-2011/  left 1996 & 10-05-1999  . ROTATOR CUFF REPAIR Bilateral   . TONSILLECTOMY    . TOTAL KNEE ARTHROPLASTY Bilateral left 1994/  right 2000  . TOTAL KNEE REVISION Left 08/02/2015   Procedure: LEFT FEMORAL REVISION;  Surgeon: Gaynelle Arabian, MD;  [facility];  Service: Orthopedics;  Laterality: Left;  . TRANSTHORACIC ECHOCARDIOGRAM  12-01-2009   Grade I diastolic dysfunction/  ef 60%/  moderate MR/  mild TR   Social History   Occupational History  . Not on file.   Social History Main Topics  . Smoking status: Former Smoker    Packs/day: 0.50    Years: 15.00    Types: Cigarettes    Quit date: 07/16/1967  . Smokeless tobacco: Never Used  . Alcohol use No  . Drug use: No  . Sexual activity: Not on file

## 2017-01-27 ENCOUNTER — Telehealth (INDEPENDENT_AMBULATORY_CARE_PROVIDER_SITE_OTHER): Payer: Self-pay | Admitting: Radiology

## 2017-01-27 ENCOUNTER — Ambulatory Visit (INDEPENDENT_AMBULATORY_CARE_PROVIDER_SITE_OTHER): Payer: PPO | Admitting: Physician Assistant

## 2017-01-27 DIAGNOSIS — M10072 Idiopathic gout, left ankle and foot: Secondary | ICD-10-CM

## 2017-01-27 MED ORDER — COLCHICINE 0.6 MG PO CAPS: ORAL_CAPSULE | ORAL | 0 refills | Status: DC

## 2017-01-27 NOTE — Telephone Encounter (Signed)
I called patient. She continues to take Eliquis. Worked in to [employer] schedule this weekend.

## 2017-01-27 NOTE — Progress Notes (Signed)
Office Visit Note   Patient: Margaretha Mahan Mersereau           Date of Birth: 1929/11/10           MRN: [id number] Visit Date: 01/27/2017              Requested by: Marletta Lor, MD [place], [place] [address] PCP: Marletta Lor, MD   Assessment & Plan: Visit Diagnoses:  1. Acute idiopathic gout of left foot     Plan: We will have her take Mitigare1 tablet every hour 2 and then 1 daily. We'll see her back next week to check progress lack of. Regards to her ankle I have her continued [employer] Walker boot weightbearing as tolerated.  Follow-Up Instructions: Return in about 1 week (around 02/03/2017).   Orders:  No orders of the defined types were placed in this encounter.  Meds ordered this encounter  Medications  . Colchicine (MITIGARE) 0.6 MG CAPS    Sig: Take one tablet and then a second tablet one hour later. Then take one tablet daily.    Dispense:  15 capsule    Refill:  0      Procedures: No procedures performed   Clinical Data: No additional findings.   Subjective: Chief Complaint  Patient presents with  . Right Ankle - Fracture, Follow-up    HPI Mrs. Mays well-known Dr. Parks [facility] service being treated for a right ankle lateral malleolus fracture. 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 She's had no new injury. However over the weekend she started having some pain in her right great toe. She states isn't sensitive to even the sheets touching it. She has no history of gout. She states overall that the ankle was well and she is able to ambulate and put full weight on the ankle. Review of Systems No fevers chills shortness breath calf pain.  Objective: Vital Signs: There were no vitals taken for this visit.  Physical Exam  Constitutional: She is oriented to person, place, and time. She appears well-developed and well-nourished. No distress.  Neurological: She is alert and oriented to person, place, and time.  Skin: She is not diaphoretic.    Psychiatric: She has a normal mood and affect. Her behavior is normal.    Ortho Exam Right lower extremity right calf supple nontender. She has minimal tenderness over the right lateral ankle. No tenderness over the medial aspect the ankle. There is no rashes skin lesions ulcerations erythema of the right ankle. She has erythema over the medial aspect of the right first MTP joint. Significant tenderness here. Some tenderness over the dorsal aspect of the second third and fourth metatarsal head area. No skin breakdown. Specialty Comments:  No specialty comments available.  Imaging: No results found.   PMFS History: Patient Active Problem List   Diagnosis Date Noted  . Displaced fracture of lateral malleolus of right fibula, subsequent encounter for closed fracture with routine healing 12/27/2016  . Abdominal pain, epigastric 11/12/2016  . Nausea and vomiting 11/12/2016  . Iron deficiency anemia 11/12/2016  . Chronic anticoagulation 11/12/2016  . Chronic atrial fibrillation ([place]) 08/05/2016  . Coronary artery disease of native artery of native heart with stable angina pectoris ([place]) 08/05/2016  . Persistent atrial fibrillation ([place]) 08/25/2015  . Failed total left knee replacement ([place]) 08/02/2015  . Sigmoid diverticulitis 11/24/2014  . Acute blood loss anemia 06/13/2014  . Neuropathic pain 06/13/2014  . Insomnia 06/13/2014  . Anxiety and depression 06/13/2014  . Cancer  of ascending colon ([place]) 06/13/2014  . Small bowel obstruction ([place]) 05/31/2014  . Colovaginal fistula 04/20/2014  . Lethargy 09/15/2013  . DEPRESSION 02/07/2010  . DIASTOLIC HEART FAILURE, CHRONIC 02/27/2009  . PURE HYPERCHOLESTEROLEMIA 04/05/2008  . Coronary atherosclerosis 04/05/2008  . Osteoarthritis 05/19/2007  . Essential hypertension 01/21/2007  . GERD 01/21/2007  . Hiatal hernia 01/21/2007  . Diverticulosis of large intestine 01/21/2007  . COLONIC POLYPS, HX OF 01/21/2007   Past Medical History:   Diagnosis Date  . Adenocarcinoma, colon [facility]) Oncologist-- Dr Truitt Merle   Multifocal (2) Colon cancer @ ileocecal valve and ascending ( mT4N1Mx), Stage IIIB, grade I, MMR normal , 2 of 16 +lymph nodes, negative surgical margins---  06-02-2014  Right hemicolectomy w/ colostomy  . Allergy   . Anxiety   . Asthma    inhaler "sometimes"  . CAD (coronary artery disease)    a. LHC 4/09: pLAD 40, mDx 70-75, pLCx 30, mLCx 50, mRCA 90, EF 60% >> PCI: BMS x2 to RCA;  b. Myoview 8/12: normal  . Cataract    bil cateracts removed  . Chronic anemia   . Chronic diastolic CHF (congestive heart failure) (HCC)    a. Echo 912 - Mild LVH, EF 55-60%, no RWMA, Gr 1 DD, mild MR, mild LAE, mild RAE, PASP 59 mmHg (mod to severe pulmo HTN)  . Clotting disorder (HCC)    hx of dvt, tia on eliquis  . Colostomy in place [facility])    s/p colostomy takedown 5/16  . Colovaginal fistula    s/p colostomy >> colostomy takedown 5/16  . COPD with chronic bronchitis ([place])   . Depression   . Dysrhythmia    A fib  . Essential hypertension   . GERD (gastroesophageal reflux disease)   . History of adenomatous polyp of colon   . History of blood transfusion   . History of DVT of lower extremity   . History of hiatal hernia   . History of TIA (transient ischemic attack)    a. Head CT 6/15: small chronic lacunar infarct in thalamus  . Hyperlipidemia   . OA (osteoarthritis)   . Osteoporosis   . Peripheral neuropathy   . Pneumonia   . Pulmonary HTN ([place])    a. PASP on Echo in 9/12:  59 mmHg  . Renal insufficiency   . Sigmoid diverticulosis    s/p sigmoid colectomy  . Stroke ([place])    TIAs  . Wears dentures   . Wears glasses     Family History  Problem Relation Age of Onset  . Osteoarthritis Mother   . Heart failure Mother   . Colon cancer Maternal Uncle 75  . Heart Problems Father   . Cancer Sister 26       ? colon cancer   . Kidney cancer Brother 46  . Esophageal cancer Neg Hx   . Rectal cancer Neg Hx   .  Stomach cancer Neg Hx   . Pancreatic cancer Neg Hx     Past Surgical History:  Procedure Laterality Date  . ABDOMINAL HYSTERECTOMY    . ANTERIOR CERVICAL DECOMP/DISCECTOMY FUSION  01-31-2009   C5 -- 7  . APPENDECTOMY    . Bone spurs Bilateral    Feet  . CARDIOVASCULAR STRESS TEST  02-26-2011   Normal lexiscan no exercise study/  no ischemia/  normal LV function and wall motion, ef 67%  . CARPAL TUNNEL RELEASE Right   . CATARACT EXTRACTION W/ INTRAOCULAR LENS  IMPLANT, BILATERAL Bilateral   .  CHOLECYSTECTOMY    . COLOSTOMY N/A 06/02/2014   Procedure: DIVERTING DESCENDING END COLOSTOMY;  Surgeon: Donnie Mesa, MD;  Location: [place];  Service: General;  Laterality: N/A;  . CORONARY ANGIOPLASTY WITH STENT PLACEMENT  11-04-2007  dr bensimhon   BMS x2 to RCA/  mild Non-obstructive disease LAD/  normal LVF  . DILATION AND CURETTAGE OF UTERUS    . EVALUATION UNDER ANESTHESIA WITH ANAL FISTULECTOMY N/A 10/13/2014   Procedure: ANAL EXAM UNDER ANESTHESIA ;  Surgeon: [name], MD;  [facility];  Service: General;  Laterality: N/A;  . HAND SURGERY     Tendon repair  . LAPAROSCOPIC SIGMOID COLECTOMY N/A 11/24/2014   Procedure: SIGMOID COLECTOMY AND COLSOTOMY CLOSURE;  Surgeon: Donnie Mesa, MD;  Location: [place];  Service: General;  Laterality: N/A;  . LUMBAR LAMINECTOMY  10-29-2002,  1969   Left  L3 -- 4  decompression  . PARTIAL COLECTOMY N/A 06/02/2014   Procedure: RIGHT PARTIAL COLECTOMY ;  Surgeon: Donnie Mesa, MD;  Location: [place];  Service: General;  Laterality: N/A;  . PATELLECTOMY Right 09/14/2013   Procedure: PATELLECTOMY;  Surgeon: Meredith Pel, MD;  Location: [place];  Service: Orthopedics;  Laterality: Right;  . PROCTOSCOPY N/A 10/13/2014   Procedure: RIDGE PROCTOSCOPY;  Surgeon: [name], MD;  [facility];  Service: General;  Laterality: N/A;  . REVISION TOTAL KNEE ARTHROPLASTY Bilateral right  04-02-2011/  left 1996 & 10-05-1999  . ROTATOR CUFF REPAIR  Bilateral   . TONSILLECTOMY    . TOTAL KNEE ARTHROPLASTY Bilateral left 1994/  right 2000  . TOTAL KNEE REVISION Left 08/02/2015   Procedure: LEFT FEMORAL REVISION;  Surgeon: Gaynelle Arabian, MD;  [facility];  Service: Orthopedics;  Laterality: Left;  . TRANSTHORACIC ECHOCARDIOGRAM  12-01-2009   Grade I diastolic dysfunction/  ef 60%/  moderate MR/  mild TR   Social History   Occupational History  . Not on file.   Social History Main Topics  . Smoking status: Former Smoker    Packs/day: 0.50    Years: 15.00    Types: Cigarettes    Quit date: 07/16/1967  . Smokeless tobacco: Never Used  . Alcohol use No  . Drug use: No  . Sexual activity: Not on file

## 2017-01-27 NOTE — Telephone Encounter (Signed)
Patient called and left message on VM that she has been in a couple of times over last month for her ankle injury, she states that she is in a boot and that her ankle is red and swollen and has been like this for 3 days now. Please call her back to advise @ [phone]

## 2017-02-03 ENCOUNTER — Ambulatory Visit (INDEPENDENT_AMBULATORY_CARE_PROVIDER_SITE_OTHER): Payer: PPO | Admitting: Orthopaedic Surgery

## 2017-02-03 DIAGNOSIS — S8261XD Displaced fracture of lateral malleolus of right fibula, subsequent encounter for closed fracture with routine healing: Secondary | ICD-10-CM

## 2017-02-03 NOTE — Progress Notes (Signed)
The patient is following up this week after seeing Benita Stabile PA-C last week and started on gout medications due to an acute flare up of gout. She said things calm down greatly. She is also on follow-up for lateral minus fracture. This injury occurred in mid June.  On examination of her right foot there is no redness at this point. She feels much better overall. There is no pain at the a.m. T PE joint. There is minimal pain over lateral malleolus and we did not x-ray today since she has appointment next week for those x-rays. We do not recognize this until the end of the appointment.  I would like to put her in a lace up ankle brace today because she is not doing as well and the boot but her pain is becoming minimal. I like to see her back a week from now and we'll get 3 views of her right ankle at that visit.

## 2017-02-10 ENCOUNTER — Ambulatory Visit (INDEPENDENT_AMBULATORY_CARE_PROVIDER_SITE_OTHER): Payer: PPO | Admitting: Physician Assistant

## 2017-02-10 ENCOUNTER — Ambulatory Visit (INDEPENDENT_AMBULATORY_CARE_PROVIDER_SITE_OTHER): Payer: PPO

## 2017-02-10 ENCOUNTER — Encounter (INDEPENDENT_AMBULATORY_CARE_PROVIDER_SITE_OTHER): Payer: Self-pay | Admitting: Physician Assistant

## 2017-02-10 DIAGNOSIS — S8261XD Displaced fracture of lateral malleolus of right fibula, subsequent encounter for closed fracture with routine healing: Secondary | ICD-10-CM

## 2017-02-10 MED ORDER — COLCHICINE 0.6 MG PO CAPS: ORAL_CAPSULE | ORAL | 0 refills | Status: DC

## 2017-02-10 NOTE — Progress Notes (Signed)
Office Visit Note   Patient: [name]           Date of Birth: [birth year]           MRN: [id number] Visit Date: 02/10/2017              Requested by: Marletta Lor, MD [place], [place] [id number] PCP: Marletta Lor, MD   Assessment & Plan: Visit Diagnoses:  1. Displaced fracture of lateral malleolus of right fibula, subsequent encounter for closed fracture with routine healing     Plan: We'll have her wear the ASO brace when out of the home. Otherwise just work on range of motion of the ankle. Refilled her colchicine today she will discuss with her primary care physician about possible being placed on a preventative for gout. All for formal physical therapy she defers. We'll see her back in 1 month check progress lack of  Follow-Up Instructions: No Follow-up on file.   Orders:  Orders Placed This Encounter  Procedures  . XR Ankle Complete Right   Meds ordered this encounter  Medications  . Colchicine (MITIGARE) 0.6 MG CAPS    Sig: Take one tablet and then a second tablet one hour later. Then take one tablet daily.    Dispense:  15 capsule    Refill:  0      Procedures: No procedures performed   Clinical Data: No additional findings.   Subjective: Chief Complaint  Patient presents with  . Right Ankle - Follow-up, Fracture    HPI [name] returns today follow-up of her right lateral malleolus fracture. She's overall doing well. Still has some discomfort lateral aspect of the ankle. In regards to her foot she states that the cultures seen did help with her pain. She still has some pain in the foot particularly at the first MTP joint.  Review of Systems Positive for foot and ankle pain.  Objective: Vital Signs: There were no vitals taken for this visit.  Physical Exam  Constitutional: She is oriented to person, place, and time. She appears well-developed and well-nourished.  Neurological: She is alert and oriented to  person, place, and time.  Psychiatric: She has a normal mood and affect. Her behavior is normal.    Ortho Exam Right ankle she has some slight tenderness over the lateral malleolus. Tenderness over the anterior talofibular ligament and the deltoid ligament region. Calf supple minimal tenderness comparable with tenderness in the left calf. She has overall tenderness throughout both lower legs. No significant edema. Specialty Comments:  No specialty comments available.  Imaging: Xr Ankle Complete Right  Result Date: 02/10/2017 Right ankle 3 views: Talus well located within the ankle mortise no diastases. Lateral malleolus fracture with further consolidation. No overall change in position alignment.    PMFS History: Patient Active Problem List   Diagnosis Date Noted  . Displaced fracture of lateral malleolus of right fibula, subsequent encounter for closed fracture with routine healing 12/27/2016  . Abdominal pain, epigastric 11/12/2016  . Nausea and vomiting 11/12/2016  . Iron deficiency anemia 11/12/2016  . Chronic anticoagulation 11/12/2016  . Chronic atrial fibrillation ([place]) 08/05/2016  . Coronary artery disease of native artery of native heart with stable angina pectoris ([place]) 08/05/2016  . Persistent atrial fibrillation ([place]) 08/25/2015  . Failed total left knee replacement ([place]) 08/02/2015  . Sigmoid diverticulitis 11/24/2014  . Acute blood loss anemia 06/13/2014  . Neuropathic pain 06/13/2014  . Insomnia 06/13/2014  . Anxiety and  depression 06/13/2014  . Cancer of ascending colon ([place]) 06/13/2014  . Small bowel obstruction ([place]) 05/31/2014  . Colovaginal fistula 04/20/2014  . Lethargy 09/15/2013  . DEPRESSION 02/07/2010  . DIASTOLIC HEART FAILURE, CHRONIC 02/27/2009  . PURE HYPERCHOLESTEROLEMIA 04/05/2008  . Coronary atherosclerosis 04/05/2008  . Osteoarthritis 05/19/2007  . Essential hypertension 01/21/2007  . GERD 01/21/2007  . Hiatal hernia 01/21/2007  .  Diverticulosis of large intestine 01/21/2007  . COLONIC POLYPS, HX OF 01/21/2007   Past Medical History:  Diagnosis Date  . Adenocarcinoma, colon [facility]) Oncologist-- Dr Truitt Merle   Multifocal (2) Colon cancer @ ileocecal valve and ascending ( mT4N1Mx), Stage IIIB, grade I, MMR normal , 2 of 16 +lymph nodes, negative surgical margins---  06-02-2014  Right hemicolectomy w/ colostomy  . Allergy   . Anxiety   . Asthma    inhaler "sometimes"  . CAD (coronary artery disease)    a. LHC 4/09: pLAD 40, mDx 70-75, pLCx 30, mLCx 50, mRCA 90, EF 60% >> PCI: BMS x2 to RCA;  b. Myoview 8/12: normal  . Cataract    bil cateracts removed  . Chronic anemia   . Chronic diastolic CHF (congestive heart failure) (HCC)    a. Echo 912 - Mild LVH, EF 55-60%, no RWMA, Gr 1 DD, mild MR, mild LAE, mild RAE, PASP 59 mmHg (mod to severe pulmo HTN)  . Clotting disorder (HCC)    hx of dvt, tia on eliquis  . Colostomy in place [facility])    s/p colostomy takedown 5/16  . Colovaginal fistula    s/p colostomy >> colostomy takedown 5/16  . COPD with chronic bronchitis ([place])   . Depression   . Dysrhythmia    A fib  . Essential hypertension   . GERD (gastroesophageal reflux disease)   . History of adenomatous polyp of colon   . History of blood transfusion   . History of DVT of lower extremity   . History of hiatal hernia   . History of TIA (transient ischemic attack)    a. Head CT 6/15: small chronic lacunar infarct in thalamus  . Hyperlipidemia   . OA (osteoarthritis)   . Osteoporosis   . Peripheral neuropathy   . Pneumonia   . Pulmonary HTN ([place])    a. PASP on Echo in 9/12:  59 mmHg  . Renal insufficiency   . Sigmoid diverticulosis    s/p sigmoid colectomy  . Stroke ([place])    TIAs  . Wears dentures   . Wears glasses     Family History  Problem Relation Age of Onset  . Osteoarthritis Mother   . Heart failure Mother   . Colon cancer Maternal Uncle 53  . Heart Problems Father   . Cancer Sister 81       ?  colon cancer   . Kidney cancer Brother 72  . Esophageal cancer Neg Hx   . Rectal cancer Neg Hx   . Stomach cancer Neg Hx   . Pancreatic cancer Neg Hx     Past Surgical History:  Procedure Laterality Date  . ABDOMINAL HYSTERECTOMY    . ANTERIOR CERVICAL DECOMP/DISCECTOMY FUSION  01-31-2009   C5 -- 7  . APPENDECTOMY    . Bone spurs Bilateral    Feet  . CARDIOVASCULAR STRESS TEST  02-26-2011   Normal lexiscan no exercise study/  no ischemia/  normal LV function and wall motion, ef 67%  . CARPAL TUNNEL RELEASE Right   . CATARACT EXTRACTION W/ INTRAOCULAR  LENS  IMPLANT, BILATERAL Bilateral   . CHOLECYSTECTOMY    . COLOSTOMY N/A 06/02/2014   Procedure: DIVERTING DESCENDING END COLOSTOMY;  Surgeon: Donnie Mesa, MD;  Location: [place];  Service: General;  Laterality: N/A;  . CORONARY ANGIOPLASTY WITH STENT PLACEMENT  11-04-2007  dr bensimhon   BMS x2 to RCA/  mild Non-obstructive disease LAD/  normal LVF  . DILATION AND CURETTAGE OF UTERUS    . EVALUATION UNDER ANESTHESIA WITH ANAL FISTULECTOMY N/A 10/13/2014   Procedure: ANAL EXAM UNDER ANESTHESIA ;  Surgeon: [name], MD;  [facility];  Service: General;  Laterality: N/A;  . HAND SURGERY     Tendon repair  . LAPAROSCOPIC SIGMOID COLECTOMY N/A 11/24/2014   Procedure: SIGMOID COLECTOMY AND COLSOTOMY CLOSURE;  Surgeon: Donnie Mesa, MD;  Location: [place];  Service: General;  Laterality: N/A;  . LUMBAR LAMINECTOMY  10-29-2002,  1969   Left  L3 -- 4  decompression  . PARTIAL COLECTOMY N/A 06/02/2014   Procedure: RIGHT PARTIAL COLECTOMY ;  Surgeon: Donnie Mesa, MD;  Location: [place];  Service: General;  Laterality: N/A;  . PATELLECTOMY Right 09/14/2013   Procedure: PATELLECTOMY;  Surgeon: Meredith Pel, MD;  Location: [place];  Service: Orthopedics;  Laterality: Right;  . PROCTOSCOPY N/A 10/13/2014   Procedure: RIDGE PROCTOSCOPY;  Surgeon: [name], MD;  [facility];  Service: General;  Laterality: N/A;  . REVISION TOTAL  KNEE ARTHROPLASTY Bilateral right  04-02-2011/  left 1996 & 10-05-1999  . ROTATOR CUFF REPAIR Bilateral   . TONSILLECTOMY    . TOTAL KNEE ARTHROPLASTY Bilateral left 1994/  right 2000  . TOTAL KNEE REVISION Left 08/02/2015   Procedure: LEFT FEMORAL REVISION;  Surgeon: Gaynelle Arabian, MD;  [facility];  Service: Orthopedics;  Laterality: Left;  . TRANSTHORACIC ECHOCARDIOGRAM  12-01-2009   Grade I diastolic dysfunction/  ef 60%/  moderate MR/  mild TR   Social History   Occupational History  . Not on file.   Social History Main Topics  . Smoking status: Former Smoker    Packs/day: 0.50    Years: 15.00    Types: Cigarettes    Quit date: 07/16/1967  . Smokeless tobacco: Never Used  . Alcohol use No  . Drug use: No  . Sexual activity: Not on file

## 2017-02-12 ENCOUNTER — Telehealth: Payer: Self-pay | Admitting: *Deleted

## 2017-02-12 NOTE — Telephone Encounter (Signed)
-----   Message from Truitt Merle, MD sent at 02/09/2017  7:43 PM EDT ----- I am so sorry that I did not see this message until now.   My nurse, please call pt to see if she wants to come to see me this week, or she would prefer to keep her appointment as it's scheduled for 8/6.   Thanks  Krista Blue  ----- Message ----- From: Algernon Huxley, RN Sent: 12/25/2016   3:24 PM To: Truitt Merle, MD  Spoke with pt and she is aware. Dr. Burr Medico would it be possible for you to see this pt sooner than scheduled appt of 02/17/17? Please advise.

## 2017-02-12 NOTE — Telephone Encounter (Signed)
Spoke with pt and was informed that pt had twisted her ankle about 6 weeks ago.  Currently in ortho boot.  Stated getting tired easily from feeling more stress since lost of her husband.   Stated has some shortness of breath, but no worse than before.  Asked if pt felt she needed to be seen sooner than 02/17/17 appt - like this week.  Pt declined.  Stated she is ok to wait until  02/17/17 appt.   Instructed pt to call office back early am 02/13/17 if any changes.  Pt voiced understanding.

## 2017-02-14 NOTE — Progress Notes (Signed)
[name]  [phone] [phone]) [phone]  Clinic Follow Up Note   Patient Care Team: Marletta Lor, MD as PCP - General (Internal Medicine) 02/17/2017  CHIEF COMPLAINTS:  Follow up multifocal colon cancer  Oncology History   Cancer of ascending colon [facility])   Staging form: Colon and Rectum, AJCC 7th Edition   - Clinical: Stage IIIB (T4a, N1, M0) - Unsigned      Cancer of ascending colon ([place])   06/02/2014 Initial Diagnosis    Adenocarcinoma, colon. she presented with anemia, nauseam anorexia and abdominal pain and was [facility].      06/02/2014 Surgery    exploratory laparotomy, right hemicolectomy, and descending colostomy by Dr. Georgette Dover      06/02/2014 Pathologic Stage    multifocal (2) colon cancer at  ileocecal valve and ascending colon.,  mT4N1Mx, at least stage IIIB, grade 1, MMR normal, surgical margins negative.       06/02/2014 Tumor Marker    CEA 1.7      11/24/2014 Surgery    SIGMOID COLECTOMY AND COLSOTOMY CLOSURE with Donnie Mesa      03/06/2016 Imaging    CT chest, abdomen and pelvis without contrast showed stable remote surgical changes, no evidence of metastatic disease. Stable atherosclerotic calcifications.      09/12/2016 Imaging    CT CAP W/ CONTRAST 09/12/2016 IMPRESSION: 1. Status post partial right hemicolectomy, without evidence to suggest local recurrence of disease or metastatic disease to the chest, abdomen or pelvis. 2. Cardiomegaly with biatrial dilatation. 3. Aortic atherosclerosis, in addition to left main and 3 vessel coronary artery disease. 4. Moderate to large hiatal hernia.      12/25/2016 Procedure    Colonoscopy Impression: - Two 3 to 5 mm polyps in the sigmoid colon and in the transverse colon, removed with a cold snare. Resceted and retrieved - Internal hemorrhoids - [place] surgeries as described. The examination was otherwise normal on direct and retroflexion views          HISTORY OF PRESENTING ILLNESS:  [name] 83 y.o. female is here because of recently diagnosed and resected multifocal colon cancer. I saw him when she was admitted to the hospital, and she is here with her family members for follow-up.  She has a history of chronic anemia and colovaginal fistula as well as diverticulosis with colonic polyps since 2008, was admitted to [facility] on 05/31/2014 With progressive abdominal pain, worse over the last couple of weeks, accompanied by nausea and vomiting, without weight loss. Workup included a CT of the abdomen and pelvis with contrast, which revealed acute small bowel obstruction due to what appears to be a soft tissue mass (apple core lesion) at the ileocecal valve suspicious for intestinal adenocarcinoma.This mass measures 6 x 4 cm. Up to 8 mm stable pericecal lymph nodes were visualized. No liver or distant metastasis identified. Small volume free fluid in the abdomen and pelvis, Likely reactive, was seen. No free air.  She underwent exploratory laparotomy, right hemicolectomy, and descending colostomy by Dr. Georgette Dover on 06/02/2014. Grossly, there are two tumors identified. The larger tumor is located at the ileocecal valve and consists of an 8.5 cm span of moderately differentiated adenocarcinoma that involves the serosa. This tumor has associated perineural and lymphovascular invasion. The second discontiguous tumor is present in the proximal right colon and spans 4.0 cm and consists of well differentiated adenocarcinoma that is morphologically dissimilar from the larger tumor. This tumor extends  into the muscularis propria.Her CEA is 1.7. It is stage mT4, N1b. immunohistochemical stains are normal, with very low probability of microsatellite instability (See details below ).  She was discharged to rehabilitation after her surgery. She has been doing well at to rehabilitation. She had a surgical wound infection a few weeks after surgery,  which was treated with oral antibiotics and wound care. She was not very physically active before the surgery due to her multiple arthritis, she is recovering well at the nursing home and is likely going home in a few days. She is able to move around with a walker, has good appetite, surgical site pain is controlled, no nausea or other complaints. She did developed some UTI symptoms, and started Cipro and probiotics yesterday at the nursing home.  CURRENT THERAPY: Surveillance  INTERIM HISTORY She returns for follow up, she is accompanied by family. She had a colonoscopy which she reports went well. She fell and broke her ankle mid-June, no surgery and she has a lot of pain. She uses a walker to move around. She has some vomiting at night or early morning. Her appetite is low but she still eats daily. She otherwise has no other concerns.   MEDICAL HISTORY:  Past Medical History:  Diagnosis Date  . Adenocarcinoma, colon [facility]) Oncologist-- Dr Truitt Merle   Multifocal (2) Colon cancer @ ileocecal valve and ascending ( mT4N1Mx), Stage IIIB, grade I, MMR normal , 2 of 16 +lymph nodes, negative surgical margins---  06-02-2014  Right hemicolectomy w/ colostomy  . Allergy   . Anxiety   . Asthma    inhaler "sometimes"  . CAD (coronary artery disease)    a. LHC 4/09: pLAD 40, mDx 70-75, pLCx 30, mLCx 50, mRCA 90, EF 60% >> PCI: BMS x2 to RCA;  b. Myoview 8/12: normal  . Cataract    bil cateracts removed  . Chronic anemia   . Chronic diastolic CHF (congestive heart failure) (HCC)    a. Echo 912 - Mild LVH, EF 55-60%, no RWMA, Gr 1 DD, mild MR, mild LAE, mild RAE, PASP 59 mmHg (mod to severe pulmo HTN)  . Clotting disorder (HCC)    hx of dvt, tia on eliquis  . Colostomy in place [facility])    s/p colostomy takedown 5/16  . Colovaginal fistula    s/p colostomy >> colostomy takedown 5/16  . COPD with chronic bronchitis ([place])   . Depression   . Dysrhythmia    A fib  . Essential hypertension   . GERD  (gastroesophageal reflux disease)   . History of adenomatous polyp of colon   . History of blood transfusion   . History of DVT of lower extremity   . History of hiatal hernia   . History of TIA (transient ischemic attack)    a. Head CT 6/15: small chronic lacunar infarct in thalamus  . Hyperlipidemia   . OA (osteoarthritis)   . Osteoporosis   . Peripheral neuropathy   . Pneumonia   . Pulmonary HTN ([place])    a. PASP on Echo in 9/12:  59 mmHg  . Renal insufficiency   . Sigmoid diverticulosis    s/p sigmoid colectomy  . Stroke ([place])    TIAs  . Wears dentures   . Wears glasses     SURGICAL HISTORY: Past Surgical History:  Procedure Laterality Date  . ABDOMINAL HYSTERECTOMY    . ANTERIOR CERVICAL DECOMP/DISCECTOMY FUSION  01-31-2009   C5 -- 7  . APPENDECTOMY    .  Bone spurs Bilateral    Feet  . CARDIOVASCULAR STRESS TEST  02-26-2011   Normal lexiscan no exercise study/  no ischemia/  normal LV function and wall motion, ef 67%  . CARPAL TUNNEL RELEASE Right   . CATARACT EXTRACTION W/ INTRAOCULAR LENS  IMPLANT, BILATERAL Bilateral   . CHOLECYSTECTOMY    . COLOSTOMY N/A 06/02/2014   Procedure: DIVERTING DESCENDING END COLOSTOMY;  Surgeon: Donnie Mesa, MD;  Location: [place];  Service: General;  Laterality: N/A;  . CORONARY ANGIOPLASTY WITH STENT PLACEMENT  11-04-2007  dr bensimhon   BMS x2 to RCA/  mild Non-obstructive disease LAD/  normal LVF  . DILATION AND CURETTAGE OF UTERUS    . EVALUATION UNDER ANESTHESIA WITH ANAL FISTULECTOMY N/A 10/13/2014   Procedure: ANAL EXAM UNDER ANESTHESIA ;  Surgeon: [name], MD;  Location: [facility];  Service: General;  Laterality: N/A;  . HAND SURGERY     Tendon repair  . LAPAROSCOPIC SIGMOID COLECTOMY N/A 11/24/2014   Procedure: SIGMOID COLECTOMY AND COLSOTOMY CLOSURE;  Surgeon: Donnie Mesa, MD;  Location: [place];  Service: General;  Laterality: N/A;  . LUMBAR LAMINECTOMY  10-29-2002,  1969   Left  L3 -- 4  decompression  . PARTIAL  COLECTOMY N/A 06/02/2014   Procedure: RIGHT PARTIAL COLECTOMY ;  Surgeon: Donnie Mesa, MD;  Location: [place];  Service: General;  Laterality: N/A;  . PATELLECTOMY Right 09/14/2013   Procedure: PATELLECTOMY;  Surgeon: Meredith Pel, MD;  Location: [name];  Service: Orthopedics;  Laterality: Right;  . PROCTOSCOPY N/A 10/13/2014   Procedure: RIDGE PROCTOSCOPY;  Surgeon: [name], MD;  Location: [facility];  Service: General;  Laterality: N/A;  . REVISION TOTAL KNEE ARTHROPLASTY Bilateral right  04-02-2011/  left 1996 & 10-05-1999  . ROTATOR CUFF REPAIR Bilateral   . TONSILLECTOMY    . TOTAL KNEE ARTHROPLASTY Bilateral left 1994/  right 2000  . TOTAL KNEE REVISION Left 08/02/2015   Procedure: LEFT FEMORAL REVISION;  Surgeon: Gaynelle Arabian, MD;  Location[facility];  Service: Orthopedics;  Laterality: Left;  . TRANSTHORACIC ECHOCARDIOGRAM  12-01-2009   Grade I diastolic dysfunction/  ef 60%/  moderate MR/  mild TR    SOCIAL HISTORY: Social History   Social History  . Marital status: Married    Spouse name: N/A  . Number of children: N/A  . Years of education: N/A   Occupational History  . Not on file.   Social History Main Topics  . Smoking status: Former Smoker    Packs/day: 0.50    Years: 15.00    Types: Cigarettes    Quit date: 07/16/1967  . Smokeless tobacco: Never Used  . Alcohol use No  . Drug use: No  . Sexual activity: Not on file   Other Topics Concern  . Not on file   Social History Narrative   Married, 2 children.  As of November 2015 her husband has been living in a nursing home for tendon after years as he suffers from multiple medical problems.   Patient does not drink alcohol nor does she smoke or chew tobacco products   Right handed   8th grade   1 cup daily   She worked for [employer] for 40 years.    FAMILY HISTORY: Family History  Problem Relation Age of Onset  . Osteoarthritis Mother   . Heart failure Mother   . Colon cancer Maternal Uncle 59  .  Heart Problems Father   . Cancer Sister 74       ?  colon cancer   . Kidney cancer Brother 79  . Esophageal cancer Neg Hx   . Rectal cancer Neg Hx   . Stomach cancer Neg Hx   . Pancreatic cancer Neg Hx     ALLERGIES:  has No Known Allergies.  MEDICATIONS:  Current Outpatient Prescriptions  Medication Sig Dispense Refill  . acetaminophen (TYLENOL) 325 MG tablet Take 650 mg by mouth every 6 (six) hours as needed for moderate pain.     Marland Kitchen apixaban (ELIQUIS) 5 MG TABS tablet Take 1 tablet (5 mg total) by mouth 2 (two) times daily. 180 tablet 3  . Ascorbic Acid (VITAMIN [name]) 1000 MG tablet Take 1,000 mg by mouth daily.    . benazepril (LOTENSIN) 40 MG tablet Take 1 tablet (40 mg total) by mouth daily. 90 tablet 2  . Colchicine (MITIGARE) 0.6 MG CAPS Take one tablet and then a second tablet one hour later. Then take one tablet daily. 15 capsule 0  . diltiazem (CARDIZEM) 30 MG tablet Take 1 tablet (30 mg total) by mouth every 8 (eight) hours. 90 tablet 6  . esomeprazole (NEXIUM) 20 MG capsule Take 1 capsule (20 mg total) by mouth daily at 12 noon. (Patient taking differently: Take 20 mg by mouth daily. ) 90 capsule 2  . ferrous sulfate 325 (65 FE) MG tablet Take 1 tablet (325 mg total) by mouth 2 (two) times daily with a meal. (Patient taking differently: Take 325 mg by mouth daily with breakfast. )  3  . furosemide (LASIX) 20 MG tablet Take 1 tablet (20 mg total) by mouth 3 (three) times a week. (Mondays, Wednesdays, and Fridays) (Patient taking differently: Take 20 mg by mouth every Monday, Wednesday, and Friday. ) 30 tablet 5  . gabapentin (NEURONTIN) 600 MG tablet Take 1 tablet (600 mg total) by mouth 3 (three) times daily. (Patient taking differently: Take 600 mg by mouth 2 (two) times daily. ) 90 tablet 3  . HYDROcodone-acetaminophen (NORCO/VICODIN) 5-325 MG tablet Take 1-2 tablets by mouth every 6 (six) hours as needed for moderate pain. 40 tablet 0  . LORazepam (ATIVAN) 0.5 MG tablet TAKE ONE  TABLET BY MOUTH TWICE DAILY AS NEEDED FOR ANXIETY OR SLEEP (Patient taking differently: Take 0.5 mg by mouth See admin instructions. Take 1 tablet (0.5 mg) by mouth daily at bedtime, [place] also take 1 tablet in the morning as needed for anxiety) 60 tablet 5  . metoprolol (LOPRESSOR) 100 MG tablet Take 1 tablet (100 mg total) by mouth 2 (two) times daily. 180 tablet 3  . nitroGLYCERIN (NITROSTAT) 0.4 MG SL tablet Place 1 tablet (0.4 mg total) under the tongue every 5 (five) minutes as needed for chest pain (x 3 pills). Reported on 09/01/2015 25 tablet 3  . omeprazole (PRILOSEC) 40 MG capsule Take 1 capsule (40 mg total) by mouth 2 (two) times daily. 60 capsule 2  . Polyethyl Glycol-Propyl Glycol (SYSTANE OP) Place 1 drop into both eyes daily.    . promethazine (PHENERGAN) 12.5 MG tablet TAKE 1 TABLET (12.5 MG TOTAL) BY MOUTH EVERY 8 HOURS AS NEEDED FOR NAUSEA OR VOMITING. (Patient taking differently: Take 12.5 mg by mouth every 8 (eight) hours as needed for nausea or vomiting. ) 20 tablet 1  . sertraline (ZOLOFT) 50 MG tablet Take 1 tablet (50 mg total) by mouth daily. 90 tablet 3  . traMADol (ULTRAM) 50 MG tablet Take 1 tablet (50 mg total) by mouth daily as needed for moderate pain. 30 tablet 0  .  traZODone (DESYREL) 100 MG tablet Take 0.5 tablets (50 mg total) by mouth at bedtime. 30 tablet 5  . triamcinolone cream (KENALOG) 0.1 % Apply 1 application topically 2 (two) times daily as needed (Eczema on legs). 30 g 2   Current Facility-Administered Medications  Medication Dose Route Frequency Provider Last Rate Last Dose  . 0.9 %  sodium chloride infusion  500 mL Intravenous Continuous [name], MD        REVIEW OF SYSTEMS:   Constitutional: Denies fevers, chills or abnormal night sweats (+) Fatigue Eyes: Denies blurriness of vision, double vision or watery eyes Ears, nose, mouth, throat, and face: Denies mucositis or sore throat Respiratory: Denies cough, dyspnea or wheezes Cardiovascular:  Denies palpitation, chest discomfort or  (+)lower extremity swelling Gastrointestinal:  Denies nausea, heartburn or change in bowel habits Skin: Denies abnormal skin rashes Lymphatics: Denies new lymphadenopathy or easy bruising Neurological:Denies numbness, tingling or new weaknesses Behavioral/Psych: Mood is stable, no new changes  MSK: (+) Right flank pain, bilateral knee pain, right hip pain. All other systems were reviewed with the patient and are negative.  PHYSICAL EXAMINATION:  ECOG PERFORMANCE STATUS: 2  Vitals:   02/17/17 0947  BP: (!) 146/87  Pulse: 89  Resp: 18  Temp: 97.9 F (36.6 [name])   Filed Weights   02/17/17 0947  Weight: 185 lb 9.6 oz (84.2 kg)    GENERAL:alert, no distress and comfortable SKIN: skin color, texture, turgor are normal, no rashes or significant lesions EYES: normal, conjunctiva are pink and non-injected, sclera clear OROPHARYNX:no exudate, no erythema and lips, buccal mucosa, and tongue normal  NECK: supple, thyroid normal size, non-tender, without nodularity LYMPH:  no palpable lymphadenopathy in the cervical, axillary or inguinal LUNGS: clear to auscultation and percussion with normal breathing effort HEART: regular rate & rhythm and no murmurs and no lower extremity edema ABDOMEN:abdomen soft, the midline surgical wound is well healed, appears clean. (+) Mild diffuse tenderness at RLQ, no hepatomegaly, normal bowel sounds. Musculoskeletal:no cyanosis of digits and no clubbing (+) Ambulatory with a walker. PSYCH: alert & oriented x 3 with fluent speech NEURO: no focal motor/sensory deficits.   LABORATORY DATA:  I have reviewed the data as listed CBC Latest Ref Rng & Units 02/17/2017 12/25/2016 10/26/2016  WBC 3.9 - 10.3 10e3/uL 6.5 6.3 6.8  Hemoglobin 11.6 - 15.9 g/dL 11.7 9.8(L) 10.0(L)  Hematocrit 34.8 - 46.6 % 39.0 32.1(L) 33.6(L)  Platelets 145 - 400 10e3/uL 133(L) 150.0 144(L)    CMP Latest Ref Rng & Units 02/17/2017 10/26/2016 09/10/2016    Glucose 70 - 140 mg/dl 102 106(H) 97  BUN 7.0 - 26.0 mg/dL 26.7(H) 25(H) 33.0(H)  Creatinine 0.6 - 1.1 mg/dL 1.3(H) 1.26(H) 1.5(H)  Sodium 136 - 145 mEq/L 137 139 141  Potassium 3.5 - 5.1 mEq/L 4.6 4.0 5.4(H)  Chloride 101 - 111 mmol/L - 105 -  CO2 22 - 29 mEq/L _0 Calcium 8.4 - 10.4 mg/dL 9.5 8.9 9.7  Total Protein 6.4 - 8.3 g/dL 7.2 7.0 7.6  Total Bilirubin 0.20 - 1.20 mg/dL 0.49 0.6 0.41  Alkaline Phos 40 - 150 U/L 61 60 72  AST 5 - 34 U/L 30 41 28  ALT 0 - 55 U/L 17 32 21   Results for [name], [name] (MRN [id number]) as of 09/16/2016 10:06  Ref. Range 03/01/2016 08:50 06/17/2016 09:50 09/10/2016 08:54  CEA Latest Ref Range: 0.0 - 4.7 ng/mL 3.1 2.6   CEA ([facility]) Latest Ref Range:  0.00 - 5.00 ng/mL 3.05 2.91 3.26   PATHOLOGY Diagnosis 12/25/2016 1. Surgical [P], transverse, polyp - TUBULAR ADENOMA (X4 FRAGMENTS). - NO HIGH GRADE DYSPLASIA OR MALIGNANCY. 2. Surgical [P], sigmoid, polyp - TUBULAR ADENOMA. - NO HIGH GRADE DYSPLASIA OR MALIGNANCY.  Colon, segmental resection for tumor, right 06/02/2014 - MULTIFOCAL INVASIVE ADENOCARCINOMA, SPANNING 8.5 CM AND 4.0 CM. - ADENOCARCINOMA INVOLVES THE SEROSA. - LYMPHOVASCULAR INVASION IS IDENTIFIED. 1 of 4 FINAL for [name], [name] ([id number]) Diagnosis(continued) - PERINEURAL INVASION IS IDENTIFIED. - METASTATIC ADENOCARCINOMA IN 2 OF 16 LYMPH NODES (2/16) WITH EXTRACAPSULAR EXTENSION. - THE SURGICAL RESECTION MARGINS ARE NEGATIVE FOR ADENOCARCINOMA. - SEE ONCOLOGY TABLE BELOW. Microscopic Comment COLON AND RECTUM (INCLUDING TRANS-ANAL RESECTION): Specimen: Terminal ileum and right colon Procedure: Resection Tumor site: Ileocecal valve (tumor #1) and proximal mid ascending colon (tumor #2) Specimen integrity: Intact Macroscopic intactness of mesorectum: N/A Macroscopic tumor perforation: Not identified Invasive tumor: Tumor #1 (ileocecal valve) Maximum size: 8.5 cm (gross measurement) Histologic type(s):  Adenocarcinoma Histologic grade and differentiation: G2: moderately differentiated/low grade Type of polyp in which invasive carcinoma arose: Likely tubular adenoma Microscopic extension of invasive tumor: Adenocarcinoma involves the serosa Invasive tumor: Tumor #2 (proximal mid ascending colon) Maximum size: 4.0 cm (gross measurement) Histologic type(s): Adenocarcinoma Histologic grade and differentiation: G1: well differentiated/low grade Type of polyp in which invasive carcinoma arose: Tubulovillous adenoma Microscopic extension of invasive tumor: Adenocarcinoma extends into the muscularis propria Lymph-Vascular invasion: Present Peri-neural invasion: Present Tumor deposit(s) (discontinuous extramural extension): Not identified Resection margins: Proximal margin: 32.0 cm Distal margin: 7.5 cm Circumferential (radial) (posterior ascending, posterior descending; lateral and posterior mid-rectum; and entire lower 1/3 rectum): 3.0 cm Treatment effect (neo-adjuvant therapy): N/A Additional polyp(s): Not identified Non-neoplastic findings: No significant findings Lymph nodes: number examined 16; number positive: 2 Pathologic Staging: mT4, N1b Ancillary studies: MSI testing will be performed on the larger higher grade tumor and the results reported separately. Additional studies can be performed upon clinician request. Comments: Grossly, there are two tumors identified. The larger tumor is located at the ileocecal valve and consists of an 8.5 cm span of moderately differentiated adenocarcinoma that involves the serosa. This tumor has associated perineural and lymphovascular invasion. The second discontiguous tumor is present in the proximal right colon and spans 4.0 cm and consists of well differentiated adenocarcinoma that is morphologically dissimilar from the larger tumor. This tumor extends into the muscularis propria. Enid Cutter MD Pathologist, Electronic Signature (Case signed  06/06/2014) Specimen Gross and Clinical Information  RADIOGRAPHIC STUDIES: I have personally reviewed the radiological images as listed and agreed with the findings in the report.  CT CAP W/ CONTRAST 09/12/2016 IMPRESSION: 1. Status post partial right hemicolectomy, without evidence to suggest local recurrence of disease or metastatic disease to the chest, abdomen or pelvis. 2. Cardiomegaly with biatrial dilatation. 3. Aortic atherosclerosis, in addition to left main and 3 vessel coronary artery disease. 4. Moderate to large hiatal hernia.  Ct abdomen and pelvis with contrast 03/06/2016 IMPRESSION: 1. Stable remote surgical changes from a partial right hemicolectomy. No CT findings for recurrent tumor or metastatic disease. 2. Stable atherosclerotic calcifications involving the thoracic aorta, abdominal aorta and branch vessels including dense 3 vessel coronary artery calcifications. 3. Moderate to large hiatal hernia. 4. Scoliosis and degenerative changes involving the spine. 5. No acute findings in the chest, abdomen or pelvis.   ASSESSMENT & PLAN:  83 y.o. Caucasian female, with multiple medical comorbidities, presented with anemia, abdominal pain and nausea. She underwent exploratory laparotomy,  right hemicolectomy, and descending colostomy by Dr. Georgette Dover on 06/02/2014. Surgical path reviewed multifocal (2) colon cancer at  ileocecal valve and ascending colon.  1. Multifocal invasive colon adenocarcinoma, mT4N1Mx, at least stage IIIB, grade 1, MMR normal. -I previously reviewed her surgical findings and her CT of abdomen and pelvis with patient and her extended family members in great detail today. Both of her colon cancer were completely surgically resected, however she does have high risk of cancer recurrence in the future due to locally advanced stage.  -Her surveillance CT scan from 03/06/2016 showed no evidence of recurrence, I discussed the scan findings with patient and her  daughter. -I reviewed her lab today. Her CBC and CMP are unremarkable except mild anemia which has been new for the past 3 months,  CEA has been normal. -Her clinical exam was unremarkable except mild right abd tenderness, which is not new  -Her recent colonoscopy was negative for local recurrence. -Due to her high risk recurrence, I obtained a CT scan on 09/12/16. This showed no evidence to suggest local recurrence of disease or metastatic disease to the chest, abdomen or pelvis, cardiomegaly with biatrial dilatation, aortic atherosclerosis in addition to left main and 3 vessel coronary artery disease, and a moderate to large hiatal hernia. -Labs reviewed, anemia has resolved. Her exam was unremarkable today. There is no clinical concern for recurrence. -We'll continue surveillance. Repeat CT CAP w/o contrast in 09/2017 and as needed following  2. Genetics -Due to her family history of colon cancer, and her daughters multifocal polyps, I recommend her to have a genetic counseling. Her daughter met our genetic counselor before and genetic testing was negative. -She declined genetic testing at this point.  3. Anemia -resolved now, continue oral iron supplement.   4. CAD, CHF -She will continue follow-up with her cardiologist Dr. Kathlen Mody -I discussed the CT scan findings of atherosclerosis, which involves her aorta and 3 coronary artery vessel, she does have frequent chest pain, I strongly encouraged her to see Dr. Kathlen Mody soon. -Follow-up with her primary care physician for other medical issues.  5. Hiatal Hernia -We discussed that she should see Dr. Henrene Pastor about this.  6. CKD stage III -Cr 1.2-1.5 -Avoid nephrotoxic agents including IV contrast and NSAIDs.  PLAN - lab and follow up in 6 months, with surveillance CT chest, abdomen and pelvis without contrast one week before.  All questions were answered. The patient knows to call the clinic with any problems, questions or concerns.  I spent  20 minutes counseling the patient face to face. The total time spent in the appointment was 25 minutes and more than 50% was on counseling.   This document serves as a record of services personally performed by Truitt Merle, MD. It was created on her behalf by Brandt Loosen, a trained medical scribe. The creation of this record is based on the scribe's personal observations and the provider's statements to them. This document has been checked and approved by the attending provider.  Truitt Merle  02/17/2017

## 2017-02-17 ENCOUNTER — Ambulatory Visit (HOSPITAL_BASED_OUTPATIENT_CLINIC_OR_DEPARTMENT_OTHER): Payer: PPO | Admitting: Hematology

## 2017-02-17 ENCOUNTER — Telehealth: Payer: Self-pay | Admitting: Hematology

## 2017-02-17 ENCOUNTER — Other Ambulatory Visit (HOSPITAL_BASED_OUTPATIENT_CLINIC_OR_DEPARTMENT_OTHER): Payer: PPO

## 2017-02-17 ENCOUNTER — Encounter: Payer: Self-pay | Admitting: Hematology

## 2017-02-17 VITALS — BP 146/87 | HR 89 | Temp 97.9°F | Resp 18 | Ht 63.5 in | Wt 185.6 lb

## 2017-02-17 DIAGNOSIS — C182 Malignant neoplasm of ascending colon: Secondary | ICD-10-CM

## 2017-02-17 DIAGNOSIS — N183 Chronic kidney disease, stage 3 (moderate): Secondary | ICD-10-CM

## 2017-02-17 DIAGNOSIS — D649 Anemia, unspecified: Secondary | ICD-10-CM

## 2017-02-17 DIAGNOSIS — I4819 Other persistent atrial fibrillation: Secondary | ICD-10-CM

## 2017-02-17 DIAGNOSIS — I481 Persistent atrial fibrillation: Secondary | ICD-10-CM

## 2017-02-17 DIAGNOSIS — I10 Essential (primary) hypertension: Secondary | ICD-10-CM

## 2017-02-17 LAB — CBC WITH DIFFERENTIAL/PLATELET
BASO%: 0.3 % (ref 0.0–2.0)
Basophils Absolute: 0 10*3/uL (ref 0.0–0.1)
EOS%: 1.4 % (ref 0.0–7.0)
Eosinophils Absolute: 0.1 10*3/uL (ref 0.0–0.5)
HCT: 39 % (ref 34.8–46.6)
HGB: 11.7 g/dL (ref 11.6–15.9)
LYMPH%: 24.8 % (ref 14.0–49.7)
MCH: 25.2 pg (ref 25.1–34.0)
MCHC: 30 g/dL — ABNORMAL LOW (ref 31.5–36.0)
MCV: 84.1 fL (ref 79.5–101.0)
MONO#: 0.7 10*3/uL (ref 0.1–0.9)
MONO%: 10.6 % (ref 0.0–14.0)
NEUT#: 4.1 10*3/uL (ref 1.5–6.5)
NEUT%: 62.9 % (ref 38.4–76.8)
Platelets: 133 10*3/uL — ABNORMAL LOW (ref 145–400)
RBC: 4.64 10*6/uL (ref 3.70–5.45)
RDW: 22.9 % — ABNORMAL HIGH (ref 11.2–14.5)
WBC: 6.5 10*3/uL (ref 3.9–10.3)
lymph#: 1.6 10*3/uL (ref 0.9–3.3)
nRBC: 0 % (ref 0–0)

## 2017-02-17 LAB — COMPREHENSIVE METABOLIC PANEL
ALT: 17 U/L (ref 0–55)
AST: 30 U/L (ref 5–34)
Albumin: 3.9 g/dL (ref 3.5–5.0)
Alkaline Phosphatase: 61 U/L (ref 40–150)
Anion Gap: 7 mEq/L (ref 3–11)
BUN: 26.7 mg/dL — ABNORMAL HIGH (ref 7.0–26.0)
CO2: 27 mEq/L (ref 22–29)
Calcium: 9.5 mg/dL (ref 8.4–10.4)
Chloride: 103 mEq/L (ref 98–109)
Creatinine: 1.3 mg/dL — ABNORMAL HIGH (ref 0.6–1.1)
EGFR: 37 mL/min/{1.73_m2} — ABNORMAL LOW (ref >90)
Glucose: 102 mg/dl (ref 70–140)
Potassium: 4.6 mEq/L (ref 3.5–5.1)
Sodium: 137 mEq/L (ref 136–145)
Total Bilirubin: 0.49 mg/dL (ref 0.20–1.20)
Total Protein: 7.2 g/dL (ref 6.4–8.3)

## 2017-02-17 LAB — CEA (IN HOUSE-CHCC): CEA (CHCC-In House): 3.56 ng/mL (ref 0.00–5.00)

## 2017-02-17 NOTE — Telephone Encounter (Signed)
Scheduled appt per 8/6 los - Gave patient AVS and calender per los. Central radiology to contact patient with ct schedule.

## 2017-02-21 ENCOUNTER — Telehealth: Payer: Self-pay | Admitting: *Deleted

## 2017-02-21 NOTE — Telephone Encounter (Signed)
-----   Message from Truitt Merle, MD sent at 02/18/2017  1:48 PM EDT ----- Please let pt know her normal CEA result. No concerns for cancer recurrence, continue monitoring.   Truitt Merle 02/18/2017

## 2017-02-21 NOTE — Telephone Encounter (Signed)
Called pt & informed of good CEA result & message given per Dr Ernestina Penna request.  Pt had no questions.

## 2017-02-26 ENCOUNTER — Encounter: Payer: Self-pay | Admitting: Internal Medicine

## 2017-02-26 ENCOUNTER — Ambulatory Visit (INDEPENDENT_AMBULATORY_CARE_PROVIDER_SITE_OTHER): Payer: PPO | Admitting: Internal Medicine

## 2017-02-26 VITALS — BP 138/80 | HR 89 | Temp 97.9°F | Ht 63.5 in | Wt 185.0 lb

## 2017-02-26 DIAGNOSIS — I10 Essential (primary) hypertension: Secondary | ICD-10-CM | POA: Diagnosis not present

## 2017-02-26 DIAGNOSIS — I481 Persistent atrial fibrillation: Secondary | ICD-10-CM | POA: Diagnosis not present

## 2017-02-26 DIAGNOSIS — I4819 Other persistent atrial fibrillation: Secondary | ICD-10-CM

## 2017-02-26 DIAGNOSIS — I5032 Chronic diastolic (congestive) heart failure: Secondary | ICD-10-CM | POA: Diagnosis not present

## 2017-02-26 DIAGNOSIS — C182 Malignant neoplasm of ascending colon: Secondary | ICD-10-CM | POA: Diagnosis not present

## 2017-02-26 DIAGNOSIS — I2510 Atherosclerotic heart disease of native coronary artery without angina pectoris: Secondary | ICD-10-CM | POA: Diagnosis not present

## 2017-02-26 MED ORDER — TRAMADOL HCL 50 MG PO TABS: 50.0000 mg | ORAL_TABLET | Freq: Every day | ORAL | 0 refills | Status: DC | PRN

## 2017-02-26 NOTE — Patient Instructions (Signed)
Limit your sodium (Salt) intake  Return in 6 months for follow-up  Cardiology followup as scheduled 

## 2017-02-26 NOTE — Progress Notes (Signed)
Subjective:    Patient ID: [name], female    DOB: [birth year], 83 y.o.   MRN: [id number]  HPI  83 year old patient who is seen today in follow-up. She was seen by orthopedics recently and treated for gouty arthritis involving the right ankle area. Symptoms have now resolved.  She is to recover from a fracture to the right ankle area. She has been seen by oncology.  Recently She is scheduled for cardiology follow-up later this week. She has a history of chronic atrial fibrillation and remains on chronic anticoagulation.  She has chronic kidney disease which has been stable.  She has essential hypertension.  Past Medical History:  Diagnosis Date  . Adenocarcinoma, colon [facility]) Oncologist-- Dr Truitt Merle   Multifocal (2) Colon cancer @ ileocecal valve and ascending ( [id number]), Stage IIIB, grade I, MMR normal , 2 of 16 +lymph nodes, negative surgical margins---  06-02-2014  Right hemicolectomy w/ colostomy  . Allergy   . Anxiety   . Asthma    inhaler "sometimes"  . CAD (coronary artery disease)    a. LHC 4/09: pLAD 40, mDx 70-75, pLCx 30, mLCx 50, mRCA 90, EF 60% >> PCI: BMS x2 to RCA;  b. Myoview 8/12: normal  . Cataract    bil cateracts removed  . Chronic anemia   . Chronic diastolic CHF (congestive heart failure) (HCC)    a. Echo 912 - Mild LVH, EF 55-60%, no RWMA, Gr 1 DD, mild MR, mild LAE, mild RAE, PASP 59 mmHg (mod to severe pulmo HTN)  . Clotting disorder (HCC)    hx of dvt, tia on eliquis  . Colostomy in place [facility])    s/p colostomy takedown 5/16  . Colovaginal fistula    s/p colostomy >> colostomy takedown 5/16  . COPD with chronic bronchitis ([place])   . Depression   . Dysrhythmia    A fib  . Essential hypertension   . GERD (gastroesophageal reflux disease)   . History of adenomatous polyp of colon   . History of blood transfusion   . History of DVT of lower extremity   . History of hiatal hernia   . History of TIA (transient ischemic attack)    a. Head CT 6/15:  small chronic lacunar infarct in thalamus  . Hyperlipidemia   . OA (osteoarthritis)   . Osteoporosis   . Peripheral neuropathy   . Pneumonia   . Pulmonary HTN ([place])    a. PASP on Echo in 9/12:  59 mmHg  . Renal insufficiency   . Sigmoid diverticulosis    s/p sigmoid colectomy  . Stroke ([place])    TIAs  . Wears dentures   . Wears glasses      Social History   Social History  . Marital status: Married    Spouse name: N/A  . Number of children: N/A  . Years of education: N/A   Occupational History  . Not on file.   Social History Main Topics  . Smoking status: Former Smoker    Packs/day: 0.50    Years: 15.00    Types: Cigarettes    Quit date: 07/16/1967  . Smokeless tobacco: Never Used  . Alcohol use No  . Drug use: No  . Sexual activity: Not on file   Other Topics Concern  . Not on file   Social History Narrative   Married, 2 children.  As of November 2015 her husband has been living in a nursing home for tendon after years  as he suffers from multiple medical problems.   Patient does not drink alcohol nor does she smoke or chew tobacco products   Right handed   8th grade   1 cup daily    Past Surgical History:  Procedure Laterality Date  . ABDOMINAL HYSTERECTOMY    . ANTERIOR CERVICAL DECOMP/DISCECTOMY FUSION  01-31-2009   C5 -- 7  . APPENDECTOMY    . Bone spurs Bilateral    Feet  . CARDIOVASCULAR STRESS TEST  02-26-2011   Normal lexiscan no exercise study/  no ischemia/  normal LV function and wall motion, ef 67%  . CARPAL TUNNEL RELEASE Right   . CATARACT EXTRACTION W/ INTRAOCULAR LENS  IMPLANT, BILATERAL Bilateral   . CHOLECYSTECTOMY    . COLOSTOMY N/A 06/02/2014   Procedure: DIVERTING DESCENDING END COLOSTOMY;  Surgeon: Donnie Mesa, MD;  Location: [place];  Service: General;  Laterality: N/A;  . CORONARY ANGIOPLASTY WITH STENT PLACEMENT  11-04-2007  dr bensimhon   BMS x2 to RCA/  mild Non-obstructive disease LAD/  normal LVF  . DILATION AND CURETTAGE  OF UTERUS    . EVALUATION UNDER ANESTHESIA WITH ANAL FISTULECTOMY N/A 10/13/2014   Procedure: ANAL EXAM UNDER ANESTHESIA ;  Surgeon: [name], MD;  Location: [facility];  Service: General;  Laterality: N/A;  . HAND SURGERY     Tendon repair  . LAPAROSCOPIC SIGMOID COLECTOMY N/A 11/24/2014   Procedure: SIGMOID COLECTOMY AND COLSOTOMY CLOSURE;  Surgeon: Donnie Mesa, MD;  Location: [place];  Service: General;  Laterality: N/A;  . LUMBAR LAMINECTOMY  10-29-2002,  1969   Left  L3 -- 4  decompression  . PARTIAL COLECTOMY N/A 06/02/2014   Procedure: RIGHT PARTIAL COLECTOMY ;  Surgeon: Donnie Mesa, MD;  Location: [place];  Service: General;  Laterality: N/A;  . PATELLECTOMY Right 09/14/2013   Procedure: PATELLECTOMY;  Surgeon: Meredith Pel, MD;  Location: [place];  Service: Orthopedics;  Laterality: Right;  . PROCTOSCOPY N/A 10/13/2014   Procedure: RIDGE PROCTOSCOPY;  Surgeon: [name], MD;  Location: [facility];  Service: General;  Laterality: N/A;  . REVISION TOTAL KNEE ARTHROPLASTY Bilateral right  04-02-2011/  left 1996 & 10-05-1999  . ROTATOR CUFF REPAIR Bilateral   . TONSILLECTOMY    . TOTAL KNEE ARTHROPLASTY Bilateral left 1994/  right 2000  . TOTAL KNEE REVISION Left 08/02/2015   Procedure: LEFT FEMORAL REVISION;  Surgeon: Gaynelle Arabian, MD;  Location: [facility];  Service: Orthopedics;  Laterality: Left;  . TRANSTHORACIC ECHOCARDIOGRAM  12-01-2009   Grade I diastolic dysfunction/  ef 60%/  moderate MR/  mild TR    Family History  Problem Relation Age of Onset  . Osteoarthritis Mother   . Heart failure Mother   . Colon cancer Maternal Uncle 53  . Heart Problems Father   . Cancer Sister 64       ? colon cancer   . Kidney cancer Brother 63  . Esophageal cancer Neg Hx   . Rectal cancer Neg Hx   . Stomach cancer Neg Hx   . Pancreatic cancer Neg Hx     No Known Allergies  Current Outpatient Prescriptions on File Prior to Visit  Medication Sig Dispense Refill  . acetaminophen  (TYLENOL) 325 MG tablet Take 650 mg by mouth every 6 (six) hours as needed for moderate pain.     Marland Kitchen apixaban (ELIQUIS) 5 MG TABS tablet Take 1 tablet (5 mg total) by mouth 2 (two) times daily. 180 tablet 3  .  Ascorbic Acid (VITAMIN C) 1000 MG tablet Take 1,000 mg by mouth daily.    . benazepril (LOTENSIN) 40 MG tablet Take 1 tablet (40 mg total) by mouth daily. 90 tablet 2  . Colchicine (MITIGARE) 0.6 MG CAPS Take one tablet and then a second tablet one hour later. Then take one tablet daily. 15 capsule 0  . diltiazem (CARDIZEM) 30 MG tablet Take 1 tablet (30 mg total) by mouth every 8 (eight) hours. 90 tablet 6  . esomeprazole (NEXIUM) 20 MG capsule Take 1 capsule (20 mg total) by mouth daily at 12 noon. (Patient taking differently: Take 20 mg by mouth daily. ) 90 capsule 2  . ferrous sulfate 325 (65 FE) MG tablet Take 1 tablet (325 mg total) by mouth 2 (two) times daily with a meal. (Patient taking differently: Take 325 mg by mouth daily with breakfast. )  3  . furosemide (LASIX) 20 MG tablet Take 1 tablet (20 mg total) by mouth 3 (three) times a week. (Mondays, Wednesdays, and Fridays) (Patient taking differently: Take 20 mg by mouth every Monday, Wednesday, and Friday. ) 30 tablet 5  . gabapentin (NEURONTIN) 600 MG tablet Take 1 tablet (600 mg total) by mouth 3 (three) times daily. (Patient taking differently: Take 600 mg by mouth 2 (two) times daily. ) 90 tablet 3  . HYDROcodone-acetaminophen (NORCO/VICODIN) 5-325 MG tablet Take 1-2 tablets by mouth every 6 (six) hours as needed for moderate pain. 40 tablet 0  . LORazepam (ATIVAN) 0.5 MG tablet TAKE ONE TABLET BY MOUTH TWICE DAILY AS NEEDED FOR ANXIETY OR SLEEP (Patient taking differently: Take 0.5 mg by mouth See admin instructions. Take 1 tablet (0.5 mg) by mouth daily at bedtime, [place] also take 1 tablet in the morning as needed for anxiety) 60 tablet 5  . metoprolol (LOPRESSOR) 100 MG tablet Take 1 tablet (100 mg total) by mouth 2 (two) times  daily. 180 tablet 3  . nitroGLYCERIN (NITROSTAT) 0.4 MG SL tablet Place 1 tablet (0.4 mg total) under the tongue every 5 (five) minutes as needed for chest pain (x 3 pills). Reported on 09/01/2015 25 tablet 3  . omeprazole (PRILOSEC) 40 MG capsule Take 1 capsule (40 mg total) by mouth 2 (two) times daily. 60 capsule 2  . Polyethyl Glycol-Propyl Glycol (SYSTANE OP) Place 1 drop into both eyes daily.    . promethazine (PHENERGAN) 12.5 MG tablet TAKE 1 TABLET (12.5 MG TOTAL) BY MOUTH EVERY 8 HOURS AS NEEDED FOR NAUSEA OR VOMITING. (Patient taking differently: Take 12.5 mg by mouth every 8 (eight) hours as needed for nausea or vomiting. ) 20 tablet 1  . sertraline (ZOLOFT) 50 MG tablet Take 1 tablet (50 mg total) by mouth daily. 90 tablet 3  . traMADol (ULTRAM) 50 MG tablet Take 1 tablet (50 mg total) by mouth daily as needed for moderate pain. 30 tablet 0  . traZODone (DESYREL) 100 MG tablet Take 0.5 tablets (50 mg total) by mouth at bedtime. 30 tablet 5  . triamcinolone cream (KENALOG) 0.1 % Apply 1 application topically 2 (two) times daily as needed (Eczema on legs). 30 g 2   Current Facility-Administered Medications on File Prior to Visit  Medication Dose Route Frequency Provider Last Rate Last Dose  . 0.9 %  sodium chloride infusion  500 mL Intravenous Continuous [name], MD        BP 138/80 (BP Location: Left Arm, Patient Position: Sitting, Cuff Size: Normal)   Pulse 89   Temp 97.9  F (36.6 C) (Oral)   Ht 5' 3.5" (1.613 m)   Wt 185 lb (83.9 kg)   SpO2 96%   BMI 32.26 kg/m    Review of Systems  Constitutional: Negative.   HENT: Negative for congestion, dental problem, hearing loss, rhinorrhea, sinus pressure, sore throat and tinnitus.   Eyes: Negative for pain, discharge and visual disturbance.  Respiratory: Negative for cough and shortness of breath.   Cardiovascular: Negative for chest pain, palpitations and leg swelling.  Gastrointestinal: Negative for abdominal distention,  abdominal pain, blood in stool, constipation, diarrhea, nausea and vomiting.  Genitourinary: Negative for difficulty urinating, dysuria, flank pain, frequency, hematuria, pelvic pain, urgency, vaginal bleeding, vaginal discharge and vaginal pain.  Musculoskeletal: Positive for arthralgias and joint swelling. Negative for gait problem.  Skin: Negative for rash.  Neurological: Negative for dizziness, syncope, speech difficulty, weakness, numbness and headaches.  Hematological: Negative for adenopathy.  Psychiatric/Behavioral: Negative for agitation, behavioral problems and dysphoric mood. The patient is not nervous/anxious.        Objective:   Physical Exam  Constitutional: She is oriented to person, place, and time. She appears well-developed and well-nourished. No distress.  Weight 185 Blood pressure 130/80  Uses a walker  HENT:  Head: Normocephalic.  Right Ear: External ear normal.  Left Ear: External ear normal.  Mouth/Throat: Oropharynx is clear and moist.  Eyes: Pupils are equal, round, and reactive to light. Conjunctivae and EOM are normal.  Neck: Normal range of motion. Neck supple. No thyromegaly present.  Cardiovascular: Normal rate, normal heart sounds and intact distal pulses.   Controlled ventricular response  Pulmonary/Chest: Effort normal and breath sounds normal.  Abdominal: Soft. Bowel sounds are normal. She exhibits no mass. There is no tenderness.  Musculoskeletal: Normal range of motion.  Minimal soft tissue swelling about the right ankle.  No active  inflammatory changes  Lymphadenopathy:    She has no cervical adenopathy.  Neurological: She is alert and oriented to person, place, and time.  Skin: Skin is warm and dry. No rash noted.  Psychiatric: She has a normal mood and affect. Her behavior is normal.          Assessment & Plan:   History of gouty arthritis, right ankle, resolved Chronic atrial fibrillation Diastolic heart failure,  compensated Status post right ankle fracture Coronary artery disease, stable History of anemia.  Stable Chronic kidney disease  Flu vaccine in the fall Follow-up here 6 months or as needed Cardiology follow-up as scheduled  Nyoka Cowden

## 2017-02-28 ENCOUNTER — Encounter: Payer: Self-pay | Admitting: Cardiovascular Disease

## 2017-02-28 ENCOUNTER — Ambulatory Visit (INDEPENDENT_AMBULATORY_CARE_PROVIDER_SITE_OTHER): Payer: PPO | Admitting: Cardiovascular Disease

## 2017-02-28 VITALS — BP 120/86 | HR 74 | Ht 63.5 in | Wt 184.0 lb

## 2017-02-28 DIAGNOSIS — Z7901 Long term (current) use of anticoagulants: Secondary | ICD-10-CM | POA: Diagnosis not present

## 2017-02-28 DIAGNOSIS — I482 Chronic atrial fibrillation, unspecified: Secondary | ICD-10-CM

## 2017-02-28 NOTE — Patient Instructions (Signed)
Medication Instructions:  Your physician recommends that you continue on your current medications as directed. Please refer to the Current Medication list given to you today.   Labwork: Your physician recommends that you return for lab work (CBC, BMET) in: 6 months on the day of or a few days before your office visit with Dr. Acie Fredrickson.    Testing/Procedures: None Ordered   Follow-Up: Your physician wants you to follow-up in: 6 months with Dr. Acie Fredrickson.  You will receive a reminder letter in the mail two months in advance. If you don't receive a letter, please call our office to schedule the follow-up appointment.   If you need a refill on your cardiac medications before your next appointment, please call your pharmacy.   Thank you for choosing [facility]! Christen Bame, RN [phone]

## 2017-02-28 NOTE — Progress Notes (Signed)
Cardiology Office Note    Date:  03/01/2017   ID:  [name], DOB 1930/01/16, MRN [id number]  PCP:  Whitney Lor, MD  Cardiologist: Dr. Burt Knack, [facility] --->  [facility] [name]    Problem List  1. Chronic Afib 2.  Hx of colon cancer  3.  CAD - s/p stenting ,  [place], 2009  4. Moderate pulmonary hypertension 5. Moderate  MR .  6. Hx of Colon cancer.  7. Essential Hypertension 8. Chronic diastolic CHF   Chief Complaint  Patient presents with  . Follow-up    atrial fib    History of Present Illness:  [name] is a 83 y.o. female with history of CAD s/p BMS to RCA x 2 in 4888, diastolic CHF, HTN, HLD, persistent afib on Eliquis, chronic dyspnea on exertion, COPD, pulmonary HTN, ? prior TIA, prior DVT, colon CA who presented for evaluation of chest pain.    Colon cancer diagnosed in 11/15. She underwent right hemicolectomy with diverting colostomy. She saw Dr. Domenic Polite in 5/16 and was cleared from a cardiac perspective for colostomy takedown and sigmoid colectomy for diverticulosis.  She was seen by Lyda Jester, PA-C 12/16 for surgical clearance (for L TKR).  ECG was noted to demonstrate new onset AFib with controlled VR.  She underwent L femoral revision of TKR 08/02/15 with Dr. Wynelle Link.  Eliquis started after knee replacement. She has been on metoprolol for rate control.   The patient was seen by oncology (Dr. Truitt Merle 03/08/16). Her surveillance CT scan from 03/06/2016 showed no evidence of recurrence. She was advised to followed up with cardiology due to continuous intermittent left chest pain and CT of Chest 02/2016 showed stable atherosclerotic calcifications in aorta, abdominal aorta and 3 vessels coronary artery.    She saw Vin Bhagat 03/26/16.She was still in atrial fibrillation with intermittent fast rates. He wanted to place monitor however she deferred due to cost. He added Cardizem 30 mg every 8 hours. Could not add Cardizem CD due to cost. Continued  Lopressor 100 mg twice a day.CHADSVASC=7 She is in the donut hole and can't afford Eliquis. Samples were given and she qualified for assistance. Myoview recommended for NTG responsive chest pain, but she deferred because of cost. CT of chest showed stable coronary calcifications.  I saw her 04/23/16 still complaining of fast heart rates associated with chest pain. I placed a monitor, told her to take extra Cardizem for rapid heart rates and added Imdur.  Holter monitor showed Atrial fibrillation with controlled rates, highest rate 128/m, PVC's, no long pauses.  Patient comes in today accompanied by her daughter. She has severe headache from the Imdur to put her in bed all day long. She has good days and bad days. She continues to of some fast heart rates associated with chest tightness. She doesn't always take the extra diltiazem like I told her until after her heart is racing for a while. She still would like to hold off on any further testing until she is out of the donut hole. She is getting new insurance in January.  Jan. 22, 2018:  [name] is seen for the first time today .  Previous patient of Domenic Polite and [employer]. Seen with daugher , [name] and 2 great grandchildren.  Has lots of generalized weakness.   [place] a month ago - legs gave .  No syncope .   Is on eliquis for her Afib .   Is on Lasix 20  mg 3 times a week.   Took it daily but this caused worsening of her CKD .   Aug. 17, 2018: [name] is seen today  Doing ok Able to do her usual activities ,  Able to do her normal houswork duties.  Has some DOE ,  Has to take several deep breaths    Past Medical History:  Diagnosis Date  . Adenocarcinoma, colon [facility]) Oncologist-- Dr Truitt Merle   Multifocal (2) Colon cancer @ ileocecal valve and ascending ( [id number]), Stage IIIB, grade I, MMR normal , 2 of 16 +lymph nodes, negative surgical margins---  06-02-2014  Right hemicolectomy w/ colostomy  . Allergy   . Anxiety   . Asthma    inhaler  "sometimes"  . CAD (coronary artery disease)    a. LHC 4/09: pLAD 40, mDx 70-75, pLCx 30, mLCx 50, mRCA 90, EF 60% >> PCI: BMS x2 to RCA;  b. Myoview 8/12: normal  . Cataract    bil cateracts removed  . Chronic anemia   . Chronic diastolic CHF (congestive heart failure) (HCC)    a. Echo 912 - Mild LVH, EF 55-60%, no RWMA, Gr 1 DD, mild MR, mild LAE, mild RAE, PASP 59 mmHg (mod to severe pulmo HTN)  . Clotting disorder (HCC)    hx of dvt, tia on eliquis  . Colostomy in place [facility])    s/p colostomy takedown 5/16  . Colovaginal fistula    s/p colostomy >> colostomy takedown 5/16  . COPD with chronic bronchitis ([place])   . Depression   . Dysrhythmia    A fib  . Essential hypertension   . GERD (gastroesophageal reflux disease)   . History of adenomatous polyp of colon   . History of blood transfusion   . History of DVT of lower extremity   . History of hiatal hernia   . History of TIA (transient ischemic attack)    a. Head CT 6/15: small chronic lacunar infarct in thalamus  . Hyperlipidemia   . OA (osteoarthritis)   . Osteoporosis   . Peripheral neuropathy   . Pneumonia   . Pulmonary HTN ([place])    a. PASP on Echo in 9/12:  59 mmHg  . Renal insufficiency   . Sigmoid diverticulosis    s/p sigmoid colectomy  . Stroke ([place])    TIAs  . Wears dentures   . Wears glasses     Past Surgical History:  Procedure Laterality Date  . ABDOMINAL HYSTERECTOMY    . ANTERIOR CERVICAL DECOMP/DISCECTOMY FUSION  01-31-2009   C5 -- 7  . APPENDECTOMY    . Bone spurs Bilateral    Feet  . CARDIOVASCULAR STRESS TEST  02-26-2011   Normal lexiscan no exercise study/  no ischemia/  normal LV function and wall motion, ef 67%  . CARPAL TUNNEL RELEASE Right   . CATARACT EXTRACTION W/ INTRAOCULAR LENS  IMPLANT, BILATERAL Bilateral   . CHOLECYSTECTOMY    . COLOSTOMY N/A 06/02/2014   Procedure: DIVERTING DESCENDING END COLOSTOMY;  Surgeon: Donnie Mesa, MD;  Location: [place];  Service: General;   Laterality: N/A;  . CORONARY ANGIOPLASTY WITH STENT PLACEMENT  11-04-2007  dr bensimhon   BMS x2 to RCA/  mild Non-obstructive disease LAD/  normal LVF  . DILATION AND CURETTAGE OF UTERUS    . EVALUATION UNDER ANESTHESIA WITH ANAL FISTULECTOMY N/A 10/13/2014   Procedure: ANAL EXAM UNDER ANESTHESIA ;  Surgeon: [name], MD;  Location: [facility];  Service: General;  Laterality: N/A;  . HAND SURGERY     Tendon repair  . LAPAROSCOPIC SIGMOID COLECTOMY N/A 11/24/2014   Procedure: SIGMOID COLECTOMY AND COLSOTOMY CLOSURE;  Surgeon: Donnie Mesa, MD;  Location: [place];  Service: General;  Laterality: N/A;  . LUMBAR LAMINECTOMY  10-29-2002,  1969   Left  L3 -- 4  decompression  . PARTIAL COLECTOMY N/A 06/02/2014   Procedure: RIGHT PARTIAL COLECTOMY ;  Surgeon: Donnie Mesa, MD;  Location: [place];  Service: General;  Laterality: N/A;  . PATELLECTOMY Right 09/14/2013   Procedure: PATELLECTOMY;  Surgeon: Meredith Pel, MD;  Location: [place];  Service: Orthopedics;  Laterality: Right;  . PROCTOSCOPY N/A 10/13/2014   Procedure: RIDGE PROCTOSCOPY;  Surgeon: [name], MD;  Location[facility];  Service: General;  Laterality: N/A;  . REVISION TOTAL KNEE ARTHROPLASTY Bilateral right  04-02-2011/  left 1996 & 10-05-1999  . ROTATOR CUFF REPAIR Bilateral   . TONSILLECTOMY    . TOTAL KNEE ARTHROPLASTY Bilateral left 1994/  right 2000  . TOTAL KNEE REVISION Left 08/02/2015   Procedure: LEFT FEMORAL REVISION;  Surgeon: Gaynelle Arabian, MD;  Location: [facility];  Service: Orthopedics;  Laterality: Left;  . TRANSTHORACIC ECHOCARDIOGRAM  12-01-2009   Grade I diastolic dysfunction/  ef 60%/  moderate MR/  mild TR    Current Medications: Outpatient Medications Prior to Visit  Medication Sig Dispense Refill  . acetaminophen (TYLENOL) 325 MG tablet Take 650 mg by mouth every 6 (six) hours as needed for moderate pain.     Marland Kitchen apixaban (ELIQUIS) 5 MG TABS tablet Take 1 tablet (5 mg total) by mouth 2 (two) times daily. 180  tablet 3  . Ascorbic Acid (VITAMIN C) 1000 MG tablet Take 1,000 mg by mouth daily.    . benazepril (LOTENSIN) 40 MG tablet Take 1 tablet (40 mg total) by mouth daily. 90 tablet 2  . Colchicine (MITIGARE) 0.6 MG CAPS Take one tablet and then a second tablet one hour later. Then take one tablet daily. 15 capsule 0  . diltiazem (CARDIZEM) 30 MG tablet Take 1 tablet (30 mg total) by mouth every 8 (eight) hours. 90 tablet 6  . esomeprazole (NEXIUM) 20 MG capsule Take 1 capsule (20 mg total) by mouth daily at 12 noon. (Patient taking differently: Take 20 mg by mouth daily. ) 90 capsule 2  . ferrous sulfate 325 (65 FE) MG tablet Take 1 tablet (325 mg total) by mouth 2 (two) times daily with a meal. (Patient taking differently: Take 325 mg by mouth daily with breakfast. )  3  . furosemide (LASIX) 20 MG tablet Take 1 tablet (20 mg total) by mouth 3 (three) times a week. (Mondays, Wednesdays, and Fridays) (Patient taking differently: Take 20 mg by mouth every Monday, Wednesday, and Friday. ) 30 tablet 5  . gabapentin (NEURONTIN) 600 MG tablet Take 1 tablet (600 mg total) by mouth 3 (three) times daily. (Patient taking differently: Take 600 mg by mouth 2 (two) times daily. ) 90 tablet 3  . HYDROcodone-acetaminophen (NORCO/VICODIN) 5-325 MG tablet Take 1-2 tablets by mouth every 6 (six) hours as needed for moderate pain. 40 tablet 0  . LORazepam (ATIVAN) 0.5 MG tablet TAKE ONE TABLET BY MOUTH TWICE DAILY AS NEEDED FOR ANXIETY OR SLEEP (Patient taking differently: Take 0.5 mg by mouth See admin instructions. Take 1 tablet (0.5 mg) by mouth daily at bedtime, [place] also take 1 tablet in the morning as needed for anxiety) 60 tablet 5  . metoprolol (  LOPRESSOR) 100 MG tablet Take 1 tablet (100 mg total) by mouth 2 (two) times daily. 180 tablet 3  . nitroGLYCERIN (NITROSTAT) 0.4 MG SL tablet Place 1 tablet (0.4 mg total) under the tongue every 5 (five) minutes as needed for chest pain (x 3 pills). Reported on 09/01/2015 25  tablet 3  . omeprazole (PRILOSEC) 40 MG capsule Take 1 capsule (40 mg total) by mouth 2 (two) times daily. 60 capsule 2  . Polyethyl Glycol-Propyl Glycol (SYSTANE OP) Place 1 drop into both eyes daily.    . promethazine (PHENERGAN) 12.5 MG tablet TAKE 1 TABLET (12.5 MG TOTAL) BY MOUTH EVERY 8 HOURS AS NEEDED FOR NAUSEA OR VOMITING. (Patient taking differently: Take 12.5 mg by mouth every 8 (eight) hours as needed for nausea or vomiting. ) 20 tablet 1  . sertraline (ZOLOFT) 50 MG tablet Take 1 tablet (50 mg total) by mouth daily. 90 tablet 3  . traMADol (ULTRAM) 50 MG tablet Take 1 tablet (50 mg total) by mouth daily as needed for moderate pain. 60 tablet 0  . traZODone (DESYREL) 100 MG tablet Take 0.5 tablets (50 mg total) by mouth at bedtime. 30 tablet 5  . triamcinolone cream (KENALOG) 0.1 % Apply 1 application topically 2 (two) times daily as needed (Eczema on legs). 30 g 2   Facility-Administered Medications Prior to Visit  Medication Dose Route Frequency Provider Last Rate Last Dose  . 0.9 %  sodium chloride infusion  500 mL Intravenous Continuous [name], MD         Allergies:   Patient has no known allergies.   Social History   Social History  . Marital status: Married    Spouse name: N/A  . Number of children: N/A  . Years of education: N/A   Social History Main Topics  . Smoking status: Former Smoker    Packs/day: 0.50    Years: 15.00    Types: Cigarettes    Quit date: 07/16/1967  . Smokeless tobacco: Never Used  . Alcohol use No  . Drug use: No  . Sexual activity: Not Asked   Other Topics Concern  . None   Social History Narrative   Married, 2 children.  As of November 2015 her husband has been living in a nursing home for tendon after years as he suffers from multiple medical problems.   Patient does not drink alcohol nor does she smoke or chew tobacco products   Right handed   8th grade   1 cup daily     Family History:  The patient's family history  includes Cancer (age of onset: 67) in her sister; Colon cancer (age of onset: 18) in her maternal uncle; Heart Problems in her father; Heart failure in her mother; Kidney cancer (age of onset: 20) in her brother; Osteoarthritis in her mother.   ROS:   Please see the history of present illness.    Review of Systems  Constitution: Positive for malaise/fatigue.  HENT: Negative.   Eyes: Negative.   Cardiovascular: Positive for dyspnea on exertion and leg swelling. Negative for chest pain and palpitations.  Respiratory: Negative.   Hematologic/Lymphatic: Negative.   Musculoskeletal: Negative.  Negative for joint pain.  Gastrointestinal: Negative.   Genitourinary: Negative.   Neurological: Positive for headaches.   All other systems reviewed and are negative.   PHYSICAL EXAM:   VS:  BP 120/86   [name] 74   Ht 5' 3.5" (1.613 m)   Wt 184 lb (83.5 kg)  SpO2 95%   BMI 32.08 kg/m   Physical Exam  GEN: Well nourished, well developed, in no acute distress  Neck: no JVD, carotid bruits, or masses Cardiac:irreg irreg with 2/6 systolic murmur LSB,no rubs, or gallops  Respiratory:  clear to auscultation bilaterally, normal work of breathing GI: soft, nontender, nondistended, + BS Ext: without cyanosis, clubbing, or edema, Good distal pulses bilaterally Whitney: no deformity or atrophy  Skin: warm and dry, no rash Psych: euthymic mood, full affect  Wt Readings from Last 3 Encounters:  02/28/17 184 lb (83.5 kg)  02/26/17 185 lb (83.9 kg)  02/17/17 185 lb 9.6 oz (84.2 kg)      Studies/Labs Reviewed:     Recent Labs: 02/17/2017: ALT 17; BUN 26.7; Creatinine 1.3; HGB 11.7; Platelets 133; Potassium 4.6; Sodium 137   Lipid Panel    Component Value Date/Time   CHOL 130 01/19/2013 1014   TRIG 87.0 01/19/2013 1014   HDL 33.60 (L) 01/19/2013 1014   CHOLHDL 4 01/19/2013 1014   VLDL 17.4 01/19/2013 1014   LDLCALC 79 01/19/2013 1014   EKG:  EKG is  ordered today.  Aug. 17, 2018   :  Atrial fib  with HR of 74.  , no ST or T wave changes  ? Septal MI    Additional studies/ records that were reviewed today include:  CT 03/06/16: IMPRESSION: 1. Stable remote surgical changes from a partial right hemicolectomy. No CT findings for recurrent tumor or metastatic disease. 2. Stable atherosclerotic calcifications involving the thoracic aorta, abdominal aorta and branch vessels including dense 3 vessel coronary artery calcifications. 3. Moderate to large hiatal hernia. 4. Scoliosis and degenerative changes involving the spine. 5. No acute findings in the chest, abdomen or pelvis.     Electronically Signed   By: Marijo Sanes M.D.   On: 03/06/2016 09:42   2Decho 07/27/15:  Study Conclusions   - Left ventricle: The cavity size was normal. Wall thickness was   increased in a pattern of mild LVH. Systolic function was normal.   The estimated ejection fraction was in the range of 50% to 55%.   Wall motion was normal; there were no regional wall motion   abnormalities. - Mitral valve: There was moderate regurgitation. - Left atrium: The atrium was severely dilated. - Right atrium: The atrium was mildly dilated. - Pulmonary arteries: Systolic pressure was mildly increased. PA   peak pressure: 44 mm Hg (S).   Impressions:   - Low normal LV systolic function; severe LAE; mild RAE; moderate   MR; mild TR; mildly elevated pulmonary pressure.      48 hr holter 05/10/16 Study Highlights  Atrial fibrillation with controlled heart rates. Highest heart rate 128 bpm Premature ventricular contractions No long pauses.    No changes in therapy.         Assessment / Plan:    1.  Persistent atrial fibrillation ;  She has chronic atrial fibrillation. She is on Eliquis.  She's tolerating this fairly well.  Will check BMP and CBC in 6 months at her next office visit   2  Essential hypertension:  BP is controlled.   3. CAD:   She has rare episodes of chest pain.  Sounds fairly  atypical   4. Moderate pulmonary hypertension: She has a stated PA pressures of 44. She takes Lasix 3 times a week. She has tried taking Lasix or frequency but this caused worsening of her CK D. I've given her the okay to  try taking Lasix several additional times per week if she has worsening shortness of breath.  5.  Diastolic heart failure:   compensated    Medication Adjustments/Labs and Tests Ordered: Current medicines are reviewed at length with the patient today.  Concerns regarding medicines are outlined above.  Medication changes, Labs and Tests ordered today are listed in the Patient Instructions below. Patient Instructions  Medication Instructions:  Your physician recommends that you continue on your current medications as directed. Please refer to the Current Medication list given to you today.   Labwork: Your physician recommends that you return for lab work (CBC, BMET) in: 6 months on the day of or a few days before your office visit with Dr. Acie Fredrickson.    Testing/Procedures: None Ordered   Follow-Up: Your physician wants you to follow-up in: 6 months with Dr. Acie Fredrickson.  You will receive a reminder letter in the mail two months in advance. If you don't receive a letter, please call our office to schedule the follow-up appointment.   If you need a refill on your cardiac medications before your next appointment, please call your pharmacy.   Thank you for choosing [facility]! Christen Bame, RN [phone]         Mertie Moores, MD  03/01/2017 4:53 PM    [facility] [address], [place], [place]  [address] Phone: [phone]; Fax: [phone]

## 2017-03-10 ENCOUNTER — Ambulatory Visit (INDEPENDENT_AMBULATORY_CARE_PROVIDER_SITE_OTHER): Payer: PPO | Admitting: Physician Assistant

## 2017-04-11 ENCOUNTER — Ambulatory Visit (INDEPENDENT_AMBULATORY_CARE_PROVIDER_SITE_OTHER): Payer: PPO | Admitting: Internal Medicine

## 2017-04-11 ENCOUNTER — Encounter: Payer: Self-pay | Admitting: Internal Medicine

## 2017-04-11 VITALS — BP 142/80 | HR 78 | Temp 98.0°F | Ht 63.5 in | Wt 183.4 lb

## 2017-04-11 DIAGNOSIS — J069 Acute upper respiratory infection, unspecified: Secondary | ICD-10-CM | POA: Diagnosis not present

## 2017-04-11 DIAGNOSIS — Z23 Encounter for immunization: Secondary | ICD-10-CM

## 2017-04-11 MED ORDER — AZITHROMYCIN 250 MG PO TABS: ORAL_TABLET | ORAL | 0 refills | Status: DC

## 2017-04-11 NOTE — Progress Notes (Signed)
Subjective:    Patient ID: [name], female    DOB: 1930-04-23, 83 y.o.   MRN: [id number]  HPI  83 year old patient who presents with a one-week history of cough and congestion. She has worsened over the past week and now has nocturnal fever, chills and some sweats.  Cough is productive of yellow to gray sputum Denies any wheezing or shortness of breath.  She feels unwell  Past Medical History:  Diagnosis Date  . Adenocarcinoma, colon [facility]) Oncologist-- Dr Truitt Merle   Multifocal (2) Colon cancer @ ileocecal valve and ascending ( [id number]), Stage IIIB, grade I, MMR normal , 2 of 16 +lymph nodes, negative surgical margins---  06-02-2014  Right hemicolectomy w/ colostomy  . Allergy   . Anxiety   . Asthma    inhaler "sometimes"  . CAD (coronary artery disease)    a. LHC 4/09: pLAD 40, mDx 70-75, pLCx 30, mLCx 50, mRCA 90, EF 60% >> PCI: BMS x2 to RCA;  b. Myoview 8/12: normal  . Cataract    bil cateracts removed  . Chronic anemia   . Chronic diastolic CHF (congestive heart failure) (HCC)    a. Echo 912 - Mild LVH, EF 55-60%, no RWMA, Gr 1 DD, mild MR, mild LAE, mild RAE, PASP 59 mmHg (mod to severe pulmo HTN)  . Clotting disorder (HCC)    hx of dvt, tia on eliquis  . Colostomy in place [facility])    s/p colostomy takedown 5/16  . Colovaginal fistula    s/p colostomy >> colostomy takedown 5/16  . COPD with chronic bronchitis ([place])   . Depression   . Dysrhythmia    A fib  . Essential hypertension   . GERD (gastroesophageal reflux disease)   . History of adenomatous polyp of colon   . History of blood transfusion   . History of DVT of lower extremity   . History of hiatal hernia   . History of TIA (transient ischemic attack)    a. Head CT 6/15: small chronic lacunar infarct in thalamus  . Hyperlipidemia   . OA (osteoarthritis)   . Osteoporosis   . Peripheral neuropathy   . Pneumonia   . Pulmonary HTN ([place])    a. PASP on Echo in 9/12:  59 mmHg  . Renal insufficiency   .  Sigmoid diverticulosis    s/p sigmoid colectomy  . Stroke ([place])    TIAs  . Wears dentures   . Wears glasses      Social History   Social History  . Marital status: Married    Spouse name: N/A  . Number of children: N/A  . Years of education: N/A   Occupational History  . Not on file.   Social History Main Topics  . Smoking status: Former Smoker    Packs/day: 0.50    Years: 15.00    Types: Cigarettes    Quit date: 07/16/1967  . Smokeless tobacco: Never Used  . Alcohol use No  . Drug use: No  . Sexual activity: Not on file   Other Topics Concern  . Not on file   Social History Narrative   Married, 2 children.  As of November 2015 her husband has been living in a nursing home for tendon after years as he suffers from multiple medical problems.   Patient does not drink alcohol nor does she smoke or chew tobacco products   Right handed   8th grade   1 cup daily  Past Surgical History:  Procedure Laterality Date  . ABDOMINAL HYSTERECTOMY    . ANTERIOR CERVICAL DECOMP/DISCECTOMY FUSION  01-31-2009   C5 -- 7  . APPENDECTOMY    . Bone spurs Bilateral    Feet  . CARDIOVASCULAR STRESS TEST  02-26-2011   Normal lexiscan no exercise study/  no ischemia/  normal LV function and wall motion, ef 67%  . CARPAL TUNNEL RELEASE Right   . CATARACT EXTRACTION W/ INTRAOCULAR LENS  IMPLANT, BILATERAL Bilateral   . CHOLECYSTECTOMY    . COLOSTOMY N/A 06/02/2014   Procedure: DIVERTING DESCENDING END COLOSTOMY;  Surgeon: Matthew Tsuei, MD;  Location: [facility] OR;  Service: General;  Laterality: N/A;  . CORONARY ANGIOPLASTY WITH STENT PLACEMENT  11-04-2007  dr bensimhon   BMS x2 to RCA/  mild Non-obstructive disease LAD/  normal LVF  . DILATION AND CURETTAGE OF UTERUS    . EVALUATION UNDER ANESTHESIA WITH ANAL FISTULECTOMY N/A 10/13/2014   Procedure: ANAL EXAM UNDER ANESTHESIA ;  Surgeon: Alicia Thomas, MD;  Location: [facility] ORS;  Service: General;  Laterality: N/A;  . HAND SURGERY     Tendon  repair  . LAPAROSCOPIC SIGMOID COLECTOMY N/A 11/24/2014   Procedure: SIGMOID COLECTOMY AND COLSOTOMY CLOSURE;  Surgeon: Matthew Tsuei, MD;  Location: [facility] OR;  Service: General;  Laterality: N/A;  . LUMBAR LAMINECTOMY  10-29-2002,  1969   Left  L3 -- 4  decompression  . PARTIAL COLECTOMY N/A 06/02/2014   Procedure: RIGHT PARTIAL COLECTOMY ;  Surgeon: Matthew Tsuei, MD;  Location: [facility] OR;  Service: General;  Laterality: N/A;  . PATELLECTOMY Right 09/14/2013   Procedure: PATELLECTOMY;  Surgeon: Gregory Scott Dean, MD;  Location: [facility] OR;  Service: Orthopedics;  Laterality: Right;  . PROCTOSCOPY N/A 10/13/2014   Procedure: RIDGE PROCTOSCOPY;  Surgeon: Alicia Thomas, MD;  Location: [facility];  Service: General;  Laterality: N/A;  . REVISION TOTAL KNEE ARTHROPLASTY Bilateral right  04-02-2011/  left 1996 & 10-05-1999  . ROTATOR CUFF REPAIR Bilateral   . TONSILLECTOMY    . TOTAL KNEE ARTHROPLASTY Bilateral left 1994/  right 2000  . TOTAL KNEE REVISION Left 08/02/2015   Procedure: LEFT FEMORAL REVISION;  Surgeon: Frank Aluisio, MD;  Location: [facility];  Service: Orthopedics;  Laterality: Left;  . TRANSTHORACIC ECHOCARDIOGRAM  12-01-2009   Grade I diastolic dysfunction/  ef 60%/  moderate MR/  mild TR    Family History  Problem Relation Age of Onset  . Osteoarthritis Mother   . Heart failure Mother   . Colon cancer Maternal Uncle 70  . Heart Problems Father   . Cancer Sister 40       ? colon cancer   . Kidney cancer Brother 30  . Esophageal cancer Neg Hx   . Rectal cancer Neg Hx   . Stomach cancer Neg Hx   . Pancreatic cancer Neg Hx     No Known Allergies  Current Outpatient Prescriptions on File Prior to Visit  Medication Sig Dispense Refill  . acetaminophen (TYLENOL) 325 MG tablet Take 650 mg by mouth every 6 (six) hours as needed for moderate pain.     . apixaban (ELIQUIS) 5 MG TABS tablet Take 1 tablet (5 mg total) by mouth 2 (two) times daily. 180 tablet 3  . Ascorbic Acid (VITAMIN C) 1000 MG  tablet Take 1,000 mg by mouth daily.    . benazepril (LOTENSIN) 40 MG tablet Take 1 tablet (40 mg total) by mouth daily. 90 tablet 2  . Colchicine (MITIGARE)   0.6 MG CAPS Take one tablet and then a second tablet one hour later. Then take one tablet daily. 15 capsule 0  . diltiazem (CARDIZEM) 30 MG tablet Take 1 tablet (30 mg total) by mouth every 8 (eight) hours. 90 tablet 6  . esomeprazole (NEXIUM) 20 MG capsule Take 1 capsule (20 mg total) by mouth daily at 12 noon. (Patient taking differently: Take 20 mg by mouth daily. ) 90 capsule 2  . ferrous sulfate 325 (65 FE) MG tablet Take 1 tablet (325 mg total) by mouth 2 (two) times daily with a meal. (Patient taking differently: Take 325 mg by mouth daily with breakfast. )  3  . furosemide (LASIX) 20 MG tablet Take 1 tablet (20 mg total) by mouth 3 (three) times a week. (Mondays, Wednesdays, and Fridays) (Patient taking differently: Take 20 mg by mouth every Monday, Wednesday, and Friday. ) 30 tablet 5  . gabapentin (NEURONTIN) 600 MG tablet Take 1 tablet (600 mg total) by mouth 3 (three) times daily. (Patient taking differently: Take 600 mg by mouth 2 (two) times daily. ) 90 tablet 3  . HYDROcodone-acetaminophen (NORCO/VICODIN) 5-325 MG tablet Take 1-2 tablets by mouth every 6 (six) hours as needed for moderate pain. 40 tablet 0  . LORazepam (ATIVAN) 0.5 MG tablet TAKE ONE TABLET BY MOUTH TWICE DAILY AS NEEDED FOR ANXIETY OR SLEEP (Patient taking differently: Take 0.5 mg by mouth See admin instructions. Take 1 tablet (0.5 mg) by mouth daily at bedtime, [place] also take 1 tablet in the morning as needed for anxiety) 60 tablet 5  . metoprolol (LOPRESSOR) 100 MG tablet Take 1 tablet (100 mg total) by mouth 2 (two) times daily. 180 tablet 3  . nitroGLYCERIN (NITROSTAT) 0.4 MG SL tablet Place 1 tablet (0.4 mg total) under the tongue every 5 (five) minutes as needed for chest pain (x 3 pills). Reported on 09/01/2015 25 tablet 3  . omeprazole (PRILOSEC) 40 MG capsule  Take 1 capsule (40 mg total) by mouth 2 (two) times daily. 60 capsule 2  . Polyethyl Glycol-Propyl Glycol (SYSTANE OP) Place 1 drop into both eyes daily.    . promethazine (PHENERGAN) 12.5 MG tablet TAKE 1 TABLET (12.5 MG TOTAL) BY MOUTH EVERY 8 HOURS AS NEEDED FOR NAUSEA OR VOMITING. (Patient taking differently: Take 12.5 mg by mouth every 8 (eight) hours as needed for nausea or vomiting. ) 20 tablet 1  . sertraline (ZOLOFT) 50 MG tablet Take 1 tablet (50 mg total) by mouth daily. 90 tablet 3  . traMADol (ULTRAM) 50 MG tablet Take 1 tablet (50 mg total) by mouth daily as needed for moderate pain. 60 tablet 0  . traZODone (DESYREL) 100 MG tablet Take 0.5 tablets (50 mg total) by mouth at bedtime. 30 tablet 5  . triamcinolone cream (KENALOG) 0.1 % Apply 1 application topically 2 (two) times daily as needed (Eczema on legs). 30 g 2   Current Facility-Administered Medications on File Prior to Visit  Medication Dose Route Frequency Provider Last Rate Last Dose  . 0.9 %  sodium chloride infusion  500 mL Intravenous Continuous [name], MD        BP (!) 142/80 (BP Location: Left Arm, Patient Position: Sitting, Cuff Size: Normal)   Pulse 78   Temp 98 F (36.7 C) (Oral)   Ht 5' 3.5" (1.613 m)   Wt 183 lb 6.4 oz (83.2 kg)   SpO2 92%   BMI 31.98 kg/m     Review of Systems  Constitutional: Positive for activity change, appetite change, chills, diaphoresis, fatigue and fever.  HENT: Negative for congestion, dental problem, hearing loss, rhinorrhea, sinus pressure, sore throat and tinnitus.   Eyes: Negative for pain, discharge and visual disturbance.  Respiratory: Positive for cough. Negative for shortness of breath.   Cardiovascular: Negative for chest pain, palpitations and leg swelling.  Gastrointestinal: Negative for abdominal distention, abdominal pain, blood in stool, constipation, diarrhea, nausea and vomiting.  Genitourinary: Negative for difficulty urinating, dysuria, flank pain,  frequency, hematuria, pelvic pain, urgency, vaginal bleeding, vaginal discharge and vaginal pain.  Musculoskeletal: Negative for arthralgias, gait problem and joint swelling.  Skin: Negative for rash.  Neurological: Negative for dizziness, syncope, speech difficulty, weakness, numbness and headaches.  Hematological: Negative for adenopathy.  Psychiatric/Behavioral: Negative for agitation, behavioral problems and dysphoric mood. The patient is not nervous/anxious.        Objective:   Physical Exam  Constitutional: She is oriented to person, place, and time. She appears well-developed and well-nourished. No distress.  Appears unwell, but in no acute distress Blood pressure normal    HENT:  Head: Normocephalic.  Right Ear: External ear normal.  Left Ear: External ear normal.  Mouth/Throat: Oropharynx is clear and moist.  Eyes: Pupils are equal, round, and reactive to light. Conjunctivae and EOM are normal.  Neck: Normal range of motion. Neck supple. No thyromegaly present.  Cardiovascular: Normal rate, regular rhythm, normal heart sounds and intact distal pulses.   Pulmonary/Chest: Effort normal.  A few crackles right base Pulse 69 Repeat O2 saturation 96% at rest  Abdominal: Soft. Bowel sounds are normal. She exhibits no mass. There is no tenderness.  Musculoskeletal: Normal range of motion.  Lymphadenopathy:    She has no cervical adenopathy.  Neurological: She is alert and oriented to person, place, and time.  Skin: Skin is warm and dry. No rash noted.  Psychiatric: She has a normal mood and affect. Her behavior is normal.          Assessment & Plan:   URI.  Possible early right lower lobe pneumonia.  Due to age and clinical worsening over the past week, and the weekend approaching.  Have elected to treat with azithromycin Will treat with expectorants as well as antitussives  Report any clinical worsening  KWIATKOWSKI,PETER FRANK  

## 2017-04-11 NOTE — Patient Instructions (Addendum)
Mucinex one tablet twice daily Delsym 1 teaspoon twice daily as needed for cough    Call if there is no improvement in 5 to 7 days or if  you develop worsening cough, fever, or new symptoms, such as shortness of breath or chest pain.  Hydrate and Humidify  Drink enough water to keep your urine clear or pale yellow. Staying hydrated will help to thin your mucus.  Use a cool mist humidifier to keep the humidity level in your home above 50%.  Inhale steam for 10-15 minutes, 3-4 times a day or as told by your health care provider. You can do this in the bathroom while a hot shower is running.  Limit your exposure to cool or dry air. Rest  Rest as much as possible.

## 2017-04-16 ENCOUNTER — Ambulatory Visit: Payer: PPO

## 2017-04-24 ENCOUNTER — Ambulatory Visit (INDEPENDENT_AMBULATORY_CARE_PROVIDER_SITE_OTHER)
Admission: RE | Admit: 2017-04-24 | Discharge: 2017-04-24 | Disposition: A | Discharge status: Home / Self Care | Payer: PPO | Source: Ambulatory Visit | Attending: Internal Medicine | Admitting: Internal Medicine

## 2017-04-24 ENCOUNTER — Encounter: Payer: Self-pay | Admitting: Internal Medicine

## 2017-04-24 ENCOUNTER — Ambulatory Visit: Payer: PPO | Admitting: Internal Medicine

## 2017-04-24 ENCOUNTER — Ambulatory Visit (INDEPENDENT_AMBULATORY_CARE_PROVIDER_SITE_OTHER): Payer: PPO | Admitting: Internal Medicine

## 2017-04-24 ENCOUNTER — Other Ambulatory Visit: Payer: Self-pay | Admitting: Internal Medicine

## 2017-04-24 VITALS — BP 110/68 | HR 87 | Temp 97.9°F | Wt 182.8 lb

## 2017-04-24 DIAGNOSIS — I481 Persistent atrial fibrillation: Secondary | ICD-10-CM | POA: Diagnosis not present

## 2017-04-24 DIAGNOSIS — K219 Gastro-esophageal reflux disease without esophagitis: Secondary | ICD-10-CM

## 2017-04-24 DIAGNOSIS — I4819 Other persistent atrial fibrillation: Secondary | ICD-10-CM

## 2017-04-24 DIAGNOSIS — I10 Essential (primary) hypertension: Secondary | ICD-10-CM | POA: Diagnosis not present

## 2017-04-24 DIAGNOSIS — R05 Cough: Secondary | ICD-10-CM | POA: Diagnosis not present

## 2017-04-24 DIAGNOSIS — R0602 Shortness of breath: Secondary | ICD-10-CM | POA: Diagnosis not present

## 2017-04-24 DIAGNOSIS — R059 Cough, unspecified: Secondary | ICD-10-CM

## 2017-04-24 MED ORDER — HYDROCODONE-ACETAMINOPHEN 5-325 MG PO TABS: 1.0000 | ORAL_TABLET | Freq: Four times a day (QID) | ORAL | 0 refills | Status: DC | PRN

## 2017-04-24 MED ORDER — LORAZEPAM 0.5 MG PO TABS: ORAL_TABLET | ORAL | 0 refills | Status: DC

## 2017-04-24 MED ORDER — AMOXICILLIN-POT CLAVULANATE 875-125 MG PO TABS: 1.0000 | ORAL_TABLET | Freq: Two times a day (BID) | ORAL | 0 refills | Status: DC

## 2017-04-24 NOTE — Progress Notes (Signed)
Subjective:    Patient ID: [name], female    DOB: 07-Jun-1930, 83 y.o.   MRN: [id number]  HPI 83 year old patient who was seen approximately 2 weeks ago.  At that time she presented with a one-week history of increasing cough and congestion.  She describes fever, chills and some night sweats.  Cough was productive of yellow to gray sputum  She was treated with azithromycin. Her chief complaint continues to be cough.  She has hypertension and treatment includes an ACE inhibitor cough is still productive of clear to light gray sputum.  In general, she is better except for persistent cough.  This is been associated with some chest and back discomfort due to the chronic cough She is still having some mild sweats mainly at night  Past Medical History:  Diagnosis Date  . Adenocarcinoma, colon [facility]) Oncologist-- Dr Truitt Merle   Multifocal (2) Colon cancer @ ileocecal valve and ascending ( [id number]), Stage IIIB, grade I, MMR normal , 2 of 16 +lymph nodes, negative surgical margins---  06-02-2014  Right hemicolectomy w/ colostomy  . Allergy   . Anxiety   . Asthma    inhaler "sometimes"  . CAD (coronary artery disease)    a. LHC 4/09: pLAD 40, mDx 70-75, pLCx 30, mLCx 50, mRCA 90, EF 60% >> PCI: BMS x2 to RCA;  b. Myoview 8/12: normal  . Cataract    bil cateracts removed  . Chronic anemia   . Chronic diastolic CHF (congestive heart failure) (HCC)    a. Echo 912 - Mild LVH, EF 55-60%, no RWMA, Gr 1 DD, mild MR, mild LAE, mild RAE, PASP 59 mmHg (mod to severe pulmo HTN)  . Clotting disorder (HCC)    hx of dvt, tia on eliquis  . Colostomy in place [facility])    s/p colostomy takedown 5/16  . Colovaginal fistula    s/p colostomy >> colostomy takedown 5/16  . COPD with chronic bronchitis ([place])   . Depression   . Dysrhythmia    A fib  . Essential hypertension   . GERD (gastroesophageal reflux disease)   . History of adenomatous polyp of colon   . History of blood transfusion   . History of DVT  of lower extremity   . History of hiatal hernia   . History of TIA (transient ischemic attack)    a. Head CT 6/15: small chronic lacunar infarct in thalamus  . Hyperlipidemia   . OA (osteoarthritis)   . Osteoporosis   . Peripheral neuropathy   . Pneumonia   . Pulmonary HTN ([employer] [employer])    a. PASP on Echo in 9/12:  59 mmHg  . Renal insufficiency   . Sigmoid diverticulosis    s/p sigmoid colectomy  . Stroke ([place])    TIAs  . Wears dentures   . Wears glasses      Social History   Social History  . Marital status: Married    Spouse name: N/A  . Number of children: N/A  . Years of education: N/A   Occupational History  . Not on file.   Social History Main Topics  . Smoking status: Former Smoker    Packs/day: 0.50    Years: 15.00    Types: Cigarettes    Quit date: 07/16/1967  . Smokeless tobacco: Never Used  . Alcohol use No  . Drug use: No  . Sexual activity: Not on file   Other Topics Concern  . Not on file   Social History  Narrative   Married, 2 children.  As of November 2015 her husband has been living in a nursing home for tendon after years as he suffers from multiple medical problems.   Patient does not drink alcohol nor does she smoke or chew tobacco products   Right handed   8th grade   1 cup daily    Past Surgical History:  Procedure Laterality Date  . ABDOMINAL HYSTERECTOMY    . ANTERIOR CERVICAL DECOMP/DISCECTOMY FUSION  01-31-2009   C5 -- 7  . APPENDECTOMY    . Bone spurs Bilateral    Feet  . CARDIOVASCULAR STRESS TEST  02-26-2011   Normal lexiscan no exercise study/  no ischemia/  normal LV function and wall motion, ef 67%  . CARPAL TUNNEL RELEASE Right   . CATARACT EXTRACTION W/ INTRAOCULAR LENS  IMPLANT, BILATERAL Bilateral   . CHOLECYSTECTOMY    . COLOSTOMY N/A 06/02/2014   Procedure: DIVERTING DESCENDING END COLOSTOMY;  Surgeon: Donnie Mesa, MD;  Location: [place];  Service: General;  Laterality: N/A;  . CORONARY ANGIOPLASTY WITH STENT PLACEMENT   11-04-2007  dr bensimhon   BMS x2 to RCA/  mild Non-obstructive disease LAD/  normal LVF  . DILATION AND CURETTAGE OF UTERUS    . EVALUATION UNDER ANESTHESIA WITH ANAL FISTULECTOMY N/A 10/13/2014   Procedure: ANAL EXAM UNDER ANESTHESIA ;  Surgeon: [name], MD;  Location: [facility];  Service: General;  Laterality: N/A;  . HAND SURGERY     Tendon repair  . LAPAROSCOPIC SIGMOID COLECTOMY N/A 11/24/2014   Procedure: SIGMOID COLECTOMY AND COLSOTOMY CLOSURE;  Surgeon: Donnie Mesa, MD;  Location: [place];  Service: General;  Laterality: N/A;  . LUMBAR LAMINECTOMY  10-29-2002,  1969   Left  L3 -- 4  decompression  . PARTIAL COLECTOMY N/A 06/02/2014   Procedure: RIGHT PARTIAL COLECTOMY ;  Surgeon: Donnie Mesa, MD;  Location: [place];  Service: General;  Laterality: N/A;  . PATELLECTOMY Right 09/14/2013   Procedure: PATELLECTOMY;  Surgeon: Meredith Pel, MD;  Location: [place];  Service: Orthopedics;  Laterality: Right;  . PROCTOSCOPY N/A 10/13/2014   Procedure: RIDGE PROCTOSCOPY;  Surgeon: [name], MD;  Location: [facility];  Service: General;  Laterality: N/A;  . REVISION TOTAL KNEE ARTHROPLASTY Bilateral right  04-02-2011/  left 1996 & 10-05-1999  . ROTATOR CUFF REPAIR Bilateral   . TONSILLECTOMY    . TOTAL KNEE ARTHROPLASTY Bilateral left 1994/  right 2000  . TOTAL KNEE REVISION Left 08/02/2015   Procedure: LEFT FEMORAL REVISION;  Surgeon: Gaynelle Arabian, MD;  Location: [facility];  Service: Orthopedics;  Laterality: Left;  . TRANSTHORACIC ECHOCARDIOGRAM  12-01-2009   Grade I diastolic dysfunction/  ef 60%/  moderate MR/  mild TR    Family History  Problem Relation Age of Onset  . Osteoarthritis Mother   . Heart failure Mother   . Colon cancer Maternal Uncle 53  . Heart Problems Father   . Cancer Sister 66       ? colon cancer   . Kidney cancer Brother 10  . Esophageal cancer Neg Hx   . Rectal cancer Neg Hx   . Stomach cancer Neg Hx   . Pancreatic cancer Neg Hx     No Known  Allergies  Current Outpatient Prescriptions on File Prior to Visit  Medication Sig Dispense Refill  . acetaminophen (TYLENOL) 325 MG tablet Take 650 mg by mouth every 6 (six) hours as needed for moderate pain.     Marland Kitchen  apixaban (ELIQUIS) 5 MG TABS tablet Take 1 tablet (5 mg total) by mouth 2 (two) times daily. 180 tablet 3  . Ascorbic Acid (VITAMIN C) 1000 MG tablet Take 1,000 mg by mouth daily.    Marland Kitchen azithromycin (ZITHROMAX) 250 MG tablet 2 tablets once daily for 3 consecutive days 6 tablet 0  . benazepril (LOTENSIN) 40 MG tablet Take 1 tablet (40 mg total) by mouth daily. 90 tablet 2  . Colchicine (MITIGARE) 0.6 MG CAPS Take one tablet and then a second tablet one hour later. Then take one tablet daily. 15 capsule 0  . diltiazem (CARDIZEM) 30 MG tablet Take 1 tablet (30 mg total) by mouth every 8 (eight) hours. 90 tablet 6  . esomeprazole (NEXIUM) 20 MG capsule Take 1 capsule (20 mg total) by mouth daily at 12 noon. (Patient taking differently: Take 20 mg by mouth daily. ) 90 capsule 2  . ferrous sulfate 325 (65 FE) MG tablet Take 1 tablet (325 mg total) by mouth 2 (two) times daily with a meal. (Patient taking differently: Take 325 mg by mouth daily with breakfast. )  3  . furosemide (LASIX) 20 MG tablet Take 1 tablet (20 mg total) by mouth 3 (three) times a week. (Mondays, Wednesdays, and Fridays) (Patient taking differently: Take 20 mg by mouth every Monday, Wednesday, and Friday. ) 30 tablet 5  . gabapentin (NEURONTIN) 600 MG tablet Take 1 tablet (600 mg total) by mouth 3 (three) times daily. (Patient taking differently: Take 600 mg by mouth 2 (two) times daily. ) 90 tablet 3  . HYDROcodone-acetaminophen (NORCO/VICODIN) 5-325 MG tablet Take 1-2 tablets by mouth every 6 (six) hours as needed for moderate pain. 40 tablet 0  . LORazepam (ATIVAN) 0.5 MG tablet TAKE ONE TABLET BY MOUTH TWICE DAILY AS NEEDED FOR ANXIETY OR SLEEP (Patient taking differently: Take 0.5 mg by mouth See admin instructions.  Take 1 tablet (0.5 mg) by mouth daily at bedtime, Spates also take 1 tablet in the morning as needed for anxiety) 60 tablet 5  . metoprolol (LOPRESSOR) 100 MG tablet Take 1 tablet (100 mg total) by mouth 2 (two) times daily. 180 tablet 3  . nitroGLYCERIN (NITROSTAT) 0.4 MG SL tablet Place 1 tablet (0.4 mg total) under the tongue every 5 (five) minutes as needed for chest pain (x 3 pills). Reported on 09/01/2015 25 tablet 3  . omeprazole (PRILOSEC) 40 MG capsule Take 1 capsule (40 mg total) by mouth 2 (two) times daily. 60 capsule 2  . Polyethyl Glycol-Propyl Glycol (SYSTANE OP) Place 1 drop into both eyes daily.    . promethazine (PHENERGAN) 12.5 MG tablet TAKE 1 TABLET (12.5 MG TOTAL) BY MOUTH EVERY 8 HOURS AS NEEDED FOR NAUSEA OR VOMITING. (Patient taking differently: Take 12.5 mg by mouth every 8 (eight) hours as needed for nausea or vomiting. ) 20 tablet 1  . sertraline (ZOLOFT) 50 MG tablet Take 1 tablet (50 mg total) by mouth daily. 90 tablet 3  . traMADol (ULTRAM) 50 MG tablet Take 1 tablet (50 mg total) by mouth daily as needed for moderate pain. 60 tablet 0  . traZODone (DESYREL) 100 MG tablet Take 0.5 tablets (50 mg total) by mouth at bedtime. 30 tablet 5  . triamcinolone cream (KENALOG) 0.1 % Apply 1 application topically 2 (two) times daily as needed (Eczema on legs). 30 g 2   Current Facility-Administered Medications on File Prior to Visit  Medication Dose Route Frequency Provider Last Rate Last Dose  . 0.9 %  sodium chloride infusion  500 mL Intravenous Continuous [name], MD        BP 110/68 (BP Location: Left Arm, Patient Position: Sitting, Cuff Size: Normal)   Pulse 87   Temp 97.9 F (36.6 C) (Oral)   Wt 182 lb 12.8 oz (82.9 kg)   SpO2 94%   BMI 31.87 kg/m      Review of Systems  Constitutional: Positive for activity change, diaphoresis and fatigue.  HENT: Negative for congestion, dental problem, hearing loss, rhinorrhea, sinus pressure, sore throat and tinnitus.    Eyes: Negative for pain, discharge and visual disturbance.  Respiratory: Positive for cough. Negative for shortness of breath.   Cardiovascular: Negative for chest pain, palpitations and leg swelling.  Gastrointestinal: Negative for abdominal distention, abdominal pain, blood in stool, constipation, diarrhea, nausea and vomiting.  Genitourinary: Negative for difficulty urinating, dysuria, flank pain, frequency, hematuria, pelvic pain, urgency, vaginal bleeding, vaginal discharge and vaginal pain.  Musculoskeletal: Negative for arthralgias, gait problem and joint swelling.  Skin: Negative for rash.  Neurological: Negative for dizziness, syncope, speech difficulty, weakness, numbness and headaches.  Hematological: Negative for adenopathy.  Psychiatric/Behavioral: Negative for agitation, behavioral problems and dysphoric mood. The patient is not nervous/anxious.        Objective:   Physical Exam  Constitutional: She is oriented to person, place, and time. She appears well-developed and well-nourished. No distress.  Frequent episodes of coughing Afebrile Blood pressure 110/70, pulse rate, normal  HENT:  Head: Normocephalic.  Right Ear: External ear normal.  Left Ear: External ear normal.  Mouth/Throat: Oropharynx is clear and moist.  Eyes: Pupils are equal, round, and reactive to light. Conjunctivae and EOM are normal.  Neck: Normal range of motion. Neck supple. No thyromegaly present.  Cardiovascular: Normal rate, regular rhythm, normal heart sounds and intact distal pulses.   Pulmonary/Chest: Effort normal.  Bilateral rhonchi, much more prominent on the right  Abdominal: Soft. Bowel sounds are normal. She exhibits no mass. There is no tenderness.  Musculoskeletal: Normal range of motion.  Lymphadenopathy:    She has no cervical adenopathy.  Neurological: She is alert and oriented to person, place, and time.  Skin: Skin is warm and dry. No rash noted.  Psychiatric: She has a normal  mood and affect. Her behavior is normal.          Assessment & Plan:   Status post URI.  Status post treatment for possible CAP with persistent cough. We'll treat cough aggressively Hydrate Review chest x-ray Discontinue ACE inhibitor  Follow-up 2 weeks  Loyd Marhefka Pilar Plate

## 2017-04-24 NOTE — Patient Instructions (Addendum)
Hydrocodone one every 6 hours  Take over-the-counter expectorants and cough medications such as  Mucinex DM.  Call if there is no improvement in 5 to 7 days or if  you develop worsening cough, fever, or new symptoms, such as shortness of breath or chest pain.  Discontinue benazepril (.  Lotensin)  Hydrate and Humidify  Drink enough water to keep your urine clear or pale yellow. Staying hydrated will help to thin your mucus.  Use a cool mist humidifier to keep the humidity level in your home above 50%.  Inhale steam for 10-15 minutes, 3-4 times a day or as told by your health care provider. You can do this in the bathroom while a hot shower is running.  Limit your exposure to cool or dry air. Rest  Rest as much as possible.   Chest x-ray as discussed

## 2017-05-01 ENCOUNTER — Ambulatory Visit (INDEPENDENT_AMBULATORY_CARE_PROVIDER_SITE_OTHER): Payer: PPO | Admitting: Internal Medicine

## 2017-05-01 ENCOUNTER — Encounter: Payer: Self-pay | Admitting: Internal Medicine

## 2017-05-01 VITALS — BP 114/68 | HR 62 | Temp 97.9°F | Ht 63.5 in | Wt 183.8 lb

## 2017-05-01 DIAGNOSIS — J181 Lobar pneumonia, unspecified organism: Secondary | ICD-10-CM

## 2017-05-01 DIAGNOSIS — J189 Pneumonia, unspecified organism: Secondary | ICD-10-CM

## 2017-05-01 NOTE — Patient Instructions (Signed)
Drink as much fluid as you  can tolerate over the next few days   return as scheduled for follow-up  Call or return to clinic prn if these symptoms worsen or fail to improve as anticipated.

## 2017-05-01 NOTE — Progress Notes (Signed)
Subjective:    Patient ID: [name], female    DOB: August 15, 1929, 83 y.o.   MRN: [id number]  HPI  83 year old patient who is seen today for follow-up of community-acquired pneumonia.  She was initially treated with azithromycin in 1 week ago was placed on Augmentin.  She continues to slowly improve, but still weak and bothered by significant coughing.  This does interfere with sleep at night.  She has been using a recliner to sleep.  Cough is less purulent  She is scheduled for chest CT in February, so will defer follow-up chest x-ray  Past Medical History:  Diagnosis Date  . Adenocarcinoma, colon [facility]) Oncologist-- Dr Truitt Merle   Multifocal (2) Colon cancer @ ileocecal valve and ascending ( [id number]), Stage IIIB, grade I, MMR normal , 2 of 16 +lymph nodes, negative surgical margins---  06-02-2014  Right hemicolectomy w/ colostomy  . Allergy   . Anxiety   . Asthma    inhaler "sometimes"  . CAD (coronary artery disease)    a. LHC 4/09: pLAD 40, mDx 70-75, pLCx 30, mLCx 50, mRCA 90, EF 60% >> PCI: BMS x2 to RCA;  b. Myoview 8/12: normal  . Cataract    bil cateracts removed  . Chronic anemia   . Chronic diastolic CHF (congestive heart failure) (HCC)    a. Echo 912 - Mild LVH, EF 55-60%, no RWMA, Gr 1 DD, mild MR, mild LAE, mild RAE, PASP 59 mmHg (mod to severe pulmo HTN)  . Clotting disorder (HCC)    hx of dvt, tia on eliquis  . Colostomy in place [facility])    s/p colostomy takedown 5/16  . Colovaginal fistula    s/p colostomy >> colostomy takedown 5/16  . COPD with chronic bronchitis ([place])   . Depression   . Dysrhythmia    A fib  . Essential hypertension   . GERD (gastroesophageal reflux disease)   . History of adenomatous polyp of colon   . History of blood transfusion   . History of DVT of lower extremity   . History of hiatal hernia   . History of TIA (transient ischemic attack)    a. Head CT 6/15: small chronic lacunar infarct in thalamus  . Hyperlipidemia   . OA  (osteoarthritis)   . Osteoporosis   . Peripheral neuropathy   . Pneumonia   . Pulmonary HTN ([place])    a. PASP on Echo in 9/12:  59 mmHg  . Renal insufficiency   . Sigmoid diverticulosis    s/p sigmoid colectomy  . Stroke ([place])    TIAs  . Wears dentures   . Wears glasses      Social History   Social History  . Marital status: Married    Spouse name: N/A  . Number of children: N/A  . Years of education: N/A   Occupational History  . Not on file.   Social History Main Topics  . Smoking status: Former Smoker    Packs/day: 0.50    Years: 15.00    Types: Cigarettes    Quit date: 07/16/1967  . Smokeless tobacco: Never Used  . Alcohol use No  . Drug use: No  . Sexual activity: Not on file   Other Topics Concern  . Not on file   Social History Narrative   Married, 2 children.  As of November 2015 her husband has been living in a nursing home for tendon after years as he suffers from multiple medical problems.  Patient does not drink alcohol nor does she smoke or chew tobacco products   Right handed   8th grade   1 cup daily    Past Surgical History:  Procedure Laterality Date  . ABDOMINAL HYSTERECTOMY    . ANTERIOR CERVICAL DECOMP/DISCECTOMY FUSION  01-31-2009   C5 -- 7  . APPENDECTOMY    . Bone spurs Bilateral    Feet  . CARDIOVASCULAR STRESS TEST  02-26-2011   Normal lexiscan no exercise study/  no ischemia/  normal LV function and wall motion, ef 67%  . CARPAL TUNNEL RELEASE Right   . CATARACT EXTRACTION W/ INTRAOCULAR LENS  IMPLANT, BILATERAL Bilateral   . CHOLECYSTECTOMY    . COLOSTOMY N/A 06/02/2014   Procedure: DIVERTING DESCENDING END COLOSTOMY;  Surgeon: Donnie Mesa, MD;  Location: [place];  Service: General;  Laterality: N/A;  . CORONARY ANGIOPLASTY WITH STENT PLACEMENT  11-04-2007  dr bensimhon   BMS x2 to RCA/  mild Non-obstructive disease LAD/  normal LVF  . DILATION AND CURETTAGE OF UTERUS    . EVALUATION UNDER ANESTHESIA WITH ANAL FISTULECTOMY N/A  10/13/2014   Procedure: ANAL EXAM UNDER ANESTHESIA ;  Surgeon: [name], MD;  Location: [facility];  Service: General;  Laterality: N/A;  . HAND SURGERY     Tendon repair  . LAPAROSCOPIC SIGMOID COLECTOMY N/A 11/24/2014   Procedure: SIGMOID COLECTOMY AND COLSOTOMY CLOSURE;  Surgeon: Donnie Mesa, MD;  Location: [place];  Service: General;  Laterality: N/A;  . LUMBAR LAMINECTOMY  10-29-2002,  1969   Left  L3 -- 4  decompression  . PARTIAL COLECTOMY N/A 06/02/2014   Procedure: RIGHT PARTIAL COLECTOMY ;  Surgeon: Donnie Mesa, MD;  Location: [place];  Service: General;  Laterality: N/A;  . PATELLECTOMY Right 09/14/2013   Procedure: PATELLECTOMY;  Surgeon: Meredith Pel, MD;  Location: [place];  Service: Orthopedics;  Laterality: Right;  . PROCTOSCOPY N/A 10/13/2014   Procedure: RIDGE PROCTOSCOPY;  Surgeon: [name], MD;  Location: [facility];  Service: General;  Laterality: N/A;  . REVISION TOTAL KNEE ARTHROPLASTY Bilateral right  04-02-2011/  left 1996 & 10-05-1999  . ROTATOR CUFF REPAIR Bilateral   . TONSILLECTOMY    . TOTAL KNEE ARTHROPLASTY Bilateral left 1994/  right 2000  . TOTAL KNEE REVISION Left 08/02/2015   Procedure: LEFT FEMORAL REVISION;  Surgeon: Gaynelle Arabian, MD;  Location: [facility];  Service: Orthopedics;  Laterality: Left;  . TRANSTHORACIC ECHOCARDIOGRAM  12-01-2009   Grade I diastolic dysfunction/  ef 60%/  moderate MR/  mild TR    Family History  Problem Relation Age of Onset  . Osteoarthritis Mother   . Heart failure Mother   . Colon cancer Maternal Uncle 16  . Heart Problems Father   . Cancer Sister 72       ? colon cancer   . Kidney cancer Brother 34  . Esophageal cancer Neg Hx   . Rectal cancer Neg Hx   . Stomach cancer Neg Hx   . Pancreatic cancer Neg Hx     No Known Allergies  Current Outpatient Prescriptions on File Prior to Visit  Medication Sig Dispense Refill  . acetaminophen (TYLENOL) 325 MG tablet Take 650 mg by mouth every 6 (six) hours as needed  for moderate pain.     Marland Kitchen amoxicillin-clavulanate (AUGMENTIN) 875-125 MG tablet Take 1 tablet by mouth 2 (two) times daily. 20 tablet 0  . apixaban (ELIQUIS) 5 MG TABS tablet Take 1 tablet (5 mg total) by  mouth 2 (two) times daily. 180 tablet 3  . Ascorbic Acid (VITAMIN C) 1000 MG tablet Take 1,000 mg by mouth daily.    . Colchicine (MITIGARE) 0.6 MG CAPS Take one tablet and then a second tablet one hour later. Then take one tablet daily. 15 capsule 0  . diltiazem (CARDIZEM) 30 MG tablet Take 1 tablet (30 mg total) by mouth every 8 (eight) hours. 90 tablet 6  . esomeprazole (NEXIUM) 20 MG capsule Take 1 capsule (20 mg total) by mouth daily at 12 noon. (Patient taking differently: Take 20 mg by mouth daily. ) 90 capsule 2  . ferrous sulfate 325 (65 FE) MG tablet Take 1 tablet (325 mg total) by mouth 2 (two) times daily with a meal. (Patient taking differently: Take 325 mg by mouth daily with breakfast. )  3  . furosemide (LASIX) 20 MG tablet Take 1 tablet (20 mg total) by mouth 3 (three) times a week. (Mondays, Wednesdays, and Fridays) (Patient taking differently: Take 20 mg by mouth every Monday, Wednesday, and Friday. ) 30 tablet 5  . gabapentin (NEURONTIN) 600 MG tablet Take 1 tablet (600 mg total) by mouth 3 (three) times daily. (Patient taking differently: Take 600 mg by mouth 2 (two) times daily. ) 90 tablet 3  . HYDROcodone-acetaminophen (NORCO/VICODIN) 5-325 MG tablet Take 1-2 tablets by mouth every 6 (six) hours as needed for moderate pain. 40 tablet 0  . LORazepam (ATIVAN) 0.5 MG tablet TAKE ONE TABLET BY MOUTH TWICE DAILY AS NEEDED FOR ANXIETY OR SLEEP 60 tablet 0  . metoprolol (LOPRESSOR) 100 MG tablet Take 1 tablet (100 mg total) by mouth 2 (two) times daily. 180 tablet 3  . nitroGLYCERIN (NITROSTAT) 0.4 MG SL tablet Place 1 tablet (0.4 mg total) under the tongue every 5 (five) minutes as needed for chest pain (x 3 pills). Reported on 09/01/2015 25 tablet 3  . omeprazole (PRILOSEC) 40 MG  capsule Take 1 capsule (40 mg total) by mouth 2 (two) times daily. 60 capsule 2  . Polyethyl Glycol-Propyl Glycol (SYSTANE OP) Place 1 drop into both eyes daily.    . promethazine (PHENERGAN) 12.5 MG tablet TAKE 1 TABLET (12.5 MG TOTAL) BY MOUTH EVERY 8 HOURS AS NEEDED FOR NAUSEA OR VOMITING. (Patient taking differently: Take 12.5 mg by mouth every 8 (eight) hours as needed for nausea or vomiting. ) 20 tablet 1  . sertraline (ZOLOFT) 50 MG tablet Take 1 tablet (50 mg total) by mouth daily. 90 tablet 3  . traMADol (ULTRAM) 50 MG tablet Take 1 tablet (50 mg total) by mouth daily as needed for moderate pain. 60 tablet 0  . traZODone (DESYREL) 100 MG tablet Take 0.5 tablets (50 mg total) by mouth at bedtime. 30 tablet 5  . triamcinolone cream (KENALOG) 0.1 % Apply 1 application topically 2 (two) times daily as needed (Eczema on legs). 30 g 2   Current Facility-Administered Medications on File Prior to Visit  Medication Dose Route Frequency Provider Last Rate Last Dose  . 0.9 %  sodium chloride infusion  500 mL Intravenous Continuous [name], MD        BP 114/68 (BP Location: Left Arm, Patient Position: Sitting, Cuff Size: Normal)   Pulse 62   Temp 97.9 F (36.6 C) (Oral)   Ht 5' 3.5" (1.613 m)   Wt 183 lb 12.8 oz (83.4 kg)   SpO2 93%   BMI 32.05 kg/m     Review of Systems  Constitutional: Positive for activity change,  appetite change and fatigue.  HENT: Negative for congestion, dental problem, hearing loss, rhinorrhea, sinus pressure, sore throat and tinnitus.   Eyes: Negative for pain, discharge and visual disturbance.  Respiratory: Positive for cough. Negative for shortness of breath.   Cardiovascular: Negative for chest pain, palpitations and leg swelling.  Gastrointestinal: Negative for abdominal distention, abdominal pain, blood in stool, constipation, diarrhea, nausea and vomiting.  Genitourinary: Negative for difficulty urinating, dysuria, flank pain, frequency, hematuria,  pelvic pain, urgency, vaginal bleeding, vaginal discharge and vaginal pain.  Musculoskeletal: Negative for arthralgias, gait problem and joint swelling.  Skin: Negative for rash.  Neurological: Negative for dizziness, syncope, speech difficulty, weakness, numbness and headaches.  Hematological: Negative for adenopathy.  Psychiatric/Behavioral: Positive for sleep disturbance. Negative for agitation, behavioral problems and dysphoric mood. The patient is not nervous/anxious.        Objective:   Physical Exam  Constitutional: She is oriented to person, place, and time. She appears well-developed and well-nourished.  afebrile No distress Less coughing  HENT:  Head: Normocephalic.  Right Ear: External ear normal.  Left Ear: External ear normal.  Mouth/Throat: Oropharynx is clear and moist.  Eyes: Pupils are equal, round, and reactive to light. Conjunctivae and EOM are normal.  Neck: Normal range of motion. Neck supple. No thyromegaly present.  Cardiovascular: Normal rate, regular rhythm, normal heart sounds and intact distal pulses.   Pulmonary/Chest: Effort normal and breath sounds normal.  Rhonchi, right greater than left, but improved  Abdominal: Soft. Bowel sounds are normal. She exhibits no mass. There is no tenderness.  Musculoskeletal: Normal range of motion.  Lymphadenopathy:    She has no cervical adenopathy.  Neurological: She is alert and oriented to person, place, and time.  Skin: Skin is warm and dry. No rash noted.  Psychiatric: She has a normal mood and affect. Her behavior is normal.          Assessment & Plan:   Resolving right middle lobe pneumonia  .  Will complete antibiotic therapy.  Continue efforts at hydration and using her humidifier .  Continue expectorants  Patient will report any clinical worsening Will defer follow-up chest x-ray in view of scheduled chest CT after the first of the year  [facility]

## 2017-07-01 ENCOUNTER — Other Ambulatory Visit: Payer: Self-pay | Admitting: Internal Medicine

## 2017-07-02 ENCOUNTER — Other Ambulatory Visit: Payer: Self-pay | Admitting: Internal Medicine

## 2017-07-18 ENCOUNTER — Telehealth: Payer: Self-pay | Admitting: Hematology

## 2017-07-18 NOTE — Telephone Encounter (Signed)
Daughter called in requested earlier appt - left message YF said okay to add on for next week -

## 2017-07-22 ENCOUNTER — Ambulatory Visit (HOSPITAL_COMMUNITY)
Admission: RE | Admit: 2017-07-22 | Discharge: 2017-07-22 | Disposition: A | Discharge status: Home / Self Care | Payer: PPO | Source: Ambulatory Visit | Attending: Hematology | Admitting: Hematology

## 2017-07-22 DIAGNOSIS — I2510 Atherosclerotic heart disease of native coronary artery without angina pectoris: Secondary | ICD-10-CM | POA: Diagnosis not present

## 2017-07-22 DIAGNOSIS — R918 Other nonspecific abnormal finding of lung field: Secondary | ICD-10-CM | POA: Insufficient documentation

## 2017-07-22 DIAGNOSIS — C182 Malignant neoplasm of ascending colon: Secondary | ICD-10-CM | POA: Diagnosis not present

## 2017-07-22 DIAGNOSIS — K449 Diaphragmatic hernia without obstruction or gangrene: Secondary | ICD-10-CM | POA: Insufficient documentation

## 2017-07-22 DIAGNOSIS — I700 Atherosclerosis of aorta: Secondary | ICD-10-CM | POA: Diagnosis not present

## 2017-07-23 ENCOUNTER — Telehealth: Payer: Self-pay | Admitting: *Deleted

## 2017-07-23 ENCOUNTER — Inpatient Hospital Stay: Payer: PPO | Attending: Hematology | Admitting: Hematology

## 2017-07-23 ENCOUNTER — Inpatient Hospital Stay: Payer: PPO

## 2017-07-23 ENCOUNTER — Encounter: Payer: Self-pay | Admitting: Hematology

## 2017-07-23 ENCOUNTER — Telehealth: Payer: Self-pay | Admitting: Hematology

## 2017-07-23 VITALS — BP 130/88 | HR 62 | Temp 98.3°F | Resp 24 | Ht 63.5 in | Wt 194.0 lb

## 2017-07-23 DIAGNOSIS — R05 Cough: Secondary | ICD-10-CM | POA: Insufficient documentation

## 2017-07-23 DIAGNOSIS — Z800 Family history of malignant neoplasm of digestive organs: Secondary | ICD-10-CM | POA: Diagnosis not present

## 2017-07-23 DIAGNOSIS — D509 Iron deficiency anemia, unspecified: Secondary | ICD-10-CM

## 2017-07-23 DIAGNOSIS — R918 Other nonspecific abnormal finding of lung field: Secondary | ICD-10-CM | POA: Insufficient documentation

## 2017-07-23 DIAGNOSIS — I2510 Atherosclerotic heart disease of native coronary artery without angina pectoris: Secondary | ICD-10-CM | POA: Insufficient documentation

## 2017-07-23 DIAGNOSIS — C182 Malignant neoplasm of ascending colon: Secondary | ICD-10-CM

## 2017-07-23 DIAGNOSIS — Z8509 Personal history of malignant neoplasm of other digestive organs: Secondary | ICD-10-CM | POA: Diagnosis present

## 2017-07-23 DIAGNOSIS — Z9049 Acquired absence of other specified parts of digestive tract: Secondary | ICD-10-CM | POA: Diagnosis not present

## 2017-07-23 DIAGNOSIS — N183 Chronic kidney disease, stage 3 (moderate): Secondary | ICD-10-CM

## 2017-07-23 DIAGNOSIS — Z8371 Family history of colonic polyps: Secondary | ICD-10-CM | POA: Diagnosis not present

## 2017-07-23 DIAGNOSIS — I509 Heart failure, unspecified: Secondary | ICD-10-CM | POA: Insufficient documentation

## 2017-07-23 DIAGNOSIS — K449 Diaphragmatic hernia without obstruction or gangrene: Secondary | ICD-10-CM | POA: Insufficient documentation

## 2017-07-23 DIAGNOSIS — Z862 Personal history of diseases of the blood and blood-forming organs and certain disorders involving the immune mechanism: Secondary | ICD-10-CM | POA: Insufficient documentation

## 2017-07-23 LAB — CBC WITH DIFFERENTIAL/PLATELET
Abs Granulocyte: 4.7 10*3/uL (ref 1.5–6.5)
Basophils Absolute: 0 10*3/uL (ref 0.0–0.1)
Basophils Relative: 0 %
Eosinophils Absolute: 0.1 10*3/uL (ref 0.0–0.5)
Eosinophils Relative: 1 %
HCT: 38.5 % (ref 34.8–46.6)
Hemoglobin: 11.8 g/dL (ref 11.6–15.9)
Lymphocytes Relative: 16 %
Lymphs Abs: 1 10*3/uL (ref 0.9–3.3)
MCH: 28 pg (ref 25.1–34.0)
MCHC: 30.6 g/dL — ABNORMAL LOW (ref 31.5–36.0)
MCV: 91.4 fL (ref 79.5–101.0)
Monocytes Absolute: 0.6 10*3/uL (ref 0.1–0.9)
Monocytes Relative: 10 %
Neutro Abs: 4.7 10*3/uL (ref 1.5–6.5)
Neutrophils Relative %: 73 %
Platelets: 126 10*3/uL — ABNORMAL LOW (ref 145–400)
RBC: 4.21 MIL/uL (ref 3.70–5.45)
RDW: 14.6 % (ref 11.2–16.1)
WBC: 6.3 10*3/uL (ref 3.9–10.3)

## 2017-07-23 LAB — COMPREHENSIVE METABOLIC PANEL
ALT: 43 U/L (ref 0–55)
AST: 45 U/L — ABNORMAL HIGH (ref 5–34)
Albumin: 3.9 g/dL (ref 3.5–5.0)
Alkaline Phosphatase: 63 U/L (ref 40–150)
Anion gap: 8 (ref 3–11)
BUN: 24 mg/dL (ref 7–26)
CO2: 27 mmol/L (ref 22–29)
Calcium: 9.1 mg/dL (ref 8.4–10.4)
Chloride: 103 mmol/L (ref 98–109)
Creatinine, Ser: 1.2 mg/dL — ABNORMAL HIGH (ref 0.60–1.10)
GFR calc Af Amer: 46 mL/min — ABNORMAL LOW (ref >60)
GFR calc non Af Amer: 40 mL/min — ABNORMAL LOW (ref >60)
Glucose, Bld: 93 mg/dL (ref 70–140)
Potassium: 4.2 mmol/L (ref 3.3–4.7)
Sodium: 138 mmol/L (ref 136–145)
Total Bilirubin: 0.8 mg/dL (ref 0.2–1.2)
Total Protein: 7.3 g/dL (ref 6.4–8.3)

## 2017-07-23 LAB — CEA (IN HOUSE-CHCC): CEA (CHCC-In House): 3.51 ng/mL (ref 0.00–5.00)

## 2017-07-23 NOTE — Telephone Encounter (Signed)
"  [name] with [facility] radiology calling to give results of yesterday's CT scans to ensure provider reads results."  Patient scheduled for F/U today at 11:30 am to review results.

## 2017-07-23 NOTE — Progress Notes (Signed)
[place]  [phone] [phone]  Clinic Follow Up Note   Patient Care Team: Marletta Lor, MD as PCP - General (Internal Medicine) 07/23/2017  CHIEF COMPLAINTS:  Follow up multifocal colon cancer  Oncology History   Cancer of ascending colon [facility])   Staging form: Colon and Rectum, AJCC 7th Edition   - Clinical: Stage IIIB (T4a, N1, M0) - Unsigned      Cancer of ascending colon ([place])   06/02/2014 Initial Diagnosis    Adenocarcinoma, colon. she presented with anemia, nauseam anorexia and abdominal pain and [facility].      06/02/2014 Surgery    exploratory laparotomy, right hemicolectomy, and descending colostomy by Dr. Georgette Dover      06/02/2014 Pathologic Stage    multifocal (2) colon cancer at  ileocecal valve and ascending colon.,  mT4N1Mx, at least stage IIIB, grade 1, MMR normal, surgical margins negative.       06/02/2014 Tumor Marker    CEA 1.7      11/24/2014 Surgery    SIGMOID COLECTOMY AND COLSOTOMY CLOSURE with Donnie Mesa      03/06/2016 Imaging    CT chest, abdomen and pelvis without contrast showed stable remote surgical changes, no evidence of metastatic disease. Stable atherosclerotic calcifications.      09/12/2016 Imaging    CT CAP W/ CONTRAST 09/12/2016 IMPRESSION: 1. Status post partial right hemicolectomy, without evidence to suggest local recurrence of disease or metastatic disease to the chest, abdomen or pelvis. 2. Cardiomegaly with biatrial dilatation. 3. Aortic atherosclerosis, in addition to left main and 3 vessel coronary artery disease. 4. Moderate to large hiatal hernia.      12/25/2016 Procedure    Colonoscopy Impression: - Two 3 to 5 mm polyps in the sigmoid colon and in the transverse colon, removed with a cold snare. Resceted and retrieved - Internal hemorrhoids - [place] surgeries as described. The examination was otherwise normal on direct and retroflexion views        07/22/2017 Imaging    CT CAP 07/22/17 IMPRESSION: 1. No evidence of local tumor recurrence at ileocolic anastomosis in the right abdomen. 2. No evidence of metastatic disease in the abdomen or pelvis. 3. New nonspecific patchy ground-glass nodularity in the right greater than left lungs. The ground-glass density is more suggestive of an inflammatory etiology. Recommend attention on follow-up chest CT in 3 months. 4. New nonspecific mild mediastinal lymphadenopathy. Given the above findings in the lungs, these nodes Oka be reactive, although short-term chest CT follow-up is again recommended in 3 months. 5. Liver surface appears finely irregular, question hepatic cirrhosis. Consider hepatic elastography for further liver fibrosis risk stratification, as clinically warranted. 6. Chronic findings include: Aortic Atherosclerosis (ICD10-I70.0). Left main and 3 vessel coronary atherosclerosis. Moderate hiatal hernia. Chronic mosaic attenuation in the lungs, most commonly due to air trapping from small airways disease.        HISTORY OF PRESENTING ILLNESS:  [name] 84 y.o. female is here because of recently diagnosed and resected multifocal colon cancer. I saw him when she was admitted to the hospital, and she is here with her family members for follow-up.  She has a history of chronic anemia and colovaginal fistula as well as diverticulosis with colonic polyps since 2008, was admitted to [facility] on 05/31/2014 With progressive abdominal pain, worse over the last couple of weeks, accompanied by nausea and vomiting, without weight loss. Workup included a CT of the abdomen  and pelvis with contrast, which revealed acute small bowel obstruction due to what appears to be a soft tissue mass (apple core lesion) at the ileocecal valve suspicious for intestinal adenocarcinoma.This mass measures 6 x 4 cm. Up to 8 mm stable pericecal lymph nodes were visualized. No liver or distant metastasis  identified. Small volume free fluid in the abdomen and pelvis, Likely reactive, was seen. No free air.  She underwent exploratory laparotomy, right hemicolectomy, and descending colostomy by Dr. Georgette Dover on 06/02/2014. Grossly, there are two tumors identified. The larger tumor is located at the ileocecal valve and consists of an 8.5 cm span of moderately differentiated adenocarcinoma that involves the serosa. This tumor has associated perineural and lymphovascular invasion. The second discontiguous tumor is present in the proximal right colon and spans 4.0 cm and consists of well differentiated adenocarcinoma that is morphologically dissimilar from the larger tumor. This tumor extends into the muscularis propria.Her CEA is 1.7. It is stage mT4, N1b. immunohistochemical stains are normal, with very low probability of microsatellite instability (See details below ).  She was discharged to rehabilitation after her surgery. She has been doing well at to rehabilitation. She had a surgical wound infection a few weeks after surgery, which was treated with oral antibiotics and wound care. She was not very physically active before the surgery due to her multiple arthritis, she is recovering well at the nursing home and is likely going home in a few days. She is able to move around with a walker, has good appetite, surgical site pain is controlled, no nausea or other complaints. She did developed some UTI symptoms, and started Cipro and probiotics yesterday at the nursing home.  CURRENT THERAPY: Surveillance  INTERIM HISTORY [name] returns for follow up, she is accompanied by family. She reports that she has not been feeling well lately due to chronic worsening weakness, body aches and joint pain. She also reports middle abdominal pain that radiates to both sides. The pain is waxing and waning and occurs several times per day for a few minutes to and hour. She states she has normal bowel movements that are quite  frequent. Pt adds that she had PNA 2 months ago with a productive cough and continuing SOB that is worse on exertion. She does not cough as often as more but it is still present with occasional flem.  Marland Kitchen   MEDICAL HISTORY:  Past Medical History:  Diagnosis Date  . Adenocarcinoma, colon [facility]) Oncologist-- Dr Truitt Merle   Multifocal (2) Colon cancer @ ileocecal valve and ascending ( mT4N1Mx), Stage IIIB, grade I, MMR normal , 2 of 16 +lymph nodes, negative surgical margins---  06-02-2014  Right hemicolectomy w/ colostomy  . Allergy   . Anxiety   . Asthma    inhaler "sometimes"  . CAD (coronary artery disease)    a. LHC 4/09: pLAD 40, mDx 70-75, pLCx 30, mLCx 50, mRCA 90, EF 60% >> PCI: BMS x2 to RCA;  b. Myoview 8/12: normal  . Cataract    bil cateracts removed  . Chronic anemia   . Chronic diastolic CHF (congestive heart failure) (HCC)    a. Echo 912 - Mild LVH, EF 55-60%, no RWMA, Gr 1 DD, mild MR, mild LAE, mild RAE, PASP 59 mmHg (mod to severe pulmo HTN)  . Clotting disorder (HCC)    hx of dvt, tia on eliquis  . Colostomy in place [facility])    s/p colostomy takedown 5/16  . Colovaginal fistula  s/p colostomy >> colostomy takedown 5/16  . COPD with chronic bronchitis ([place])   . Depression   . Dysrhythmia    A fib  . Essential hypertension   . GERD (gastroesophageal reflux disease)   . History of adenomatous polyp of colon   . History of blood transfusion   . History of DVT of lower extremity   . History of hiatal hernia   . History of TIA (transient ischemic attack)    a. Head CT 6/15: small chronic lacunar infarct in thalamus  . Hyperlipidemia   . OA (osteoarthritis)   . Osteoporosis   . Peripheral neuropathy   . Pneumonia   . Pulmonary HTN ([place])    a. PASP on Echo in 9/12:  59 mmHg  . Renal insufficiency   . Sigmoid diverticulosis    s/p sigmoid colectomy  . Stroke ([place])    TIAs  . Wears dentures   . Wears glasses     SURGICAL HISTORY: Past Surgical History:    Procedure Laterality Date  . ABDOMINAL HYSTERECTOMY    . ANTERIOR CERVICAL DECOMP/DISCECTOMY FUSION  01-31-2009   C5 -- 7  . APPENDECTOMY    . Bone spurs Bilateral    Feet  . CARDIOVASCULAR STRESS TEST  02-26-2011   Normal lexiscan no exercise study/  no ischemia/  normal LV function and wall motion, ef 67%  . CARPAL TUNNEL RELEASE Right   . CATARACT EXTRACTION W/ INTRAOCULAR LENS  IMPLANT, BILATERAL Bilateral   . CHOLECYSTECTOMY    . COLOSTOMY N/A 06/02/2014   Procedure: DIVERTING DESCENDING END COLOSTOMY;  Surgeon: Donnie Mesa, MD;  Location: [place];  Service: General;  Laterality: N/A;  . CORONARY ANGIOPLASTY WITH STENT PLACEMENT  11-04-2007  dr bensimhon   BMS x2 to RCA/  mild Non-obstructive disease LAD/  normal LVF  . DILATION AND CURETTAGE OF UTERUS    . EVALUATION UNDER ANESTHESIA WITH ANAL FISTULECTOMY N/A 10/13/2014   Procedure: ANAL EXAM UNDER ANESTHESIA ;  Surgeon: [name], MD;  Location: [facility];  Service: General;  Laterality: N/A;  . HAND SURGERY     Tendon repair  . LAPAROSCOPIC SIGMOID COLECTOMY N/A 11/24/2014   Procedure: SIGMOID COLECTOMY AND COLSOTOMY CLOSURE;  Surgeon: Donnie Mesa, MD;  Location: [place];  Service: General;  Laterality: N/A;  . LUMBAR LAMINECTOMY  10-29-2002,  1969   Left  L3 -- 4  decompression  . PARTIAL COLECTOMY N/A 06/02/2014   Procedure: RIGHT PARTIAL COLECTOMY ;  Surgeon: Donnie Mesa, MD;  Location: [place];  Service: General;  Laterality: N/A;  . PATELLECTOMY Right 09/14/2013   Procedure: PATELLECTOMY;  Surgeon: Meredith Pel, MD;  Location: [place];  Service: Orthopedics;  Laterality: Right;  . PROCTOSCOPY N/A 10/13/2014   Procedure: RIDGE PROCTOSCOPY;  Surgeon: [name], MD;  Location: [facility];  Service: General;  Laterality: N/A;  . REVISION TOTAL KNEE ARTHROPLASTY Bilateral right  04-02-2011/  left 1996 & 10-05-1999  . ROTATOR CUFF REPAIR Bilateral   . TONSILLECTOMY    . TOTAL KNEE ARTHROPLASTY Bilateral left 1994/  right  2000  . TOTAL KNEE REVISION Left 08/02/2015   Procedure: LEFT FEMORAL REVISION;  Surgeon: Gaynelle Arabian, MD;  Location: [facility];  Service: Orthopedics;  Laterality: Left;  . TRANSTHORACIC ECHOCARDIOGRAM  12-01-2009   Grade I diastolic dysfunction/  ef 60%/  moderate MR/  mild TR    SOCIAL HISTORY: Social History   Socioeconomic History  . Marital status: Widowed    Spouse name: Not  on file  . Number of children: Not on file  . Years of education: Not on file  . Highest education level: Not on file  Social Needs  . Financial resource strain: Not on file  . Food insecurity - worry: Not on file  . Food insecurity - inability: Not on file  . Transportation needs - medical: Not on file  . Transportation needs - non-medical: Not on file  Occupational History  . Not on file  Tobacco Use  . Smoking status: Former Smoker    Packs/day: 0.50    Years: 15.00    Pack years: 7.50    Types: Cigarettes    Last attempt to quit: 07/16/1967    Years since quitting: 50.0  . Smokeless tobacco: Never Used  Substance and Sexual Activity  . Alcohol use: No    Alcohol/week: 0.0 oz  . Drug use: No  . Sexual activity: Not on file  Other Topics Concern  . Not on file  Social History Narrative   Married, 2 children.  As of November 2015 her husband has been living in a nursing home for tendon after years as he suffers from multiple medical problems.   Patient does not drink alcohol nor does she smoke or chew tobacco products   Right handed   8th grade   1 cup daily   She worked for [employer] for 40 years.    FAMILY HISTORY: Family History  Problem Relation Age of Onset  . Osteoarthritis Mother   . Heart failure Mother   . Colon cancer Maternal Uncle 84  . Heart Problems Father   . Cancer Sister 22       ? colon cancer   . Kidney cancer Brother 33  . Esophageal cancer Neg Hx   . Rectal cancer Neg Hx   . Stomach cancer Neg Hx   . Pancreatic cancer Neg Hx     ALLERGIES:  has No Known  Allergies.  MEDICATIONS:  Current Outpatient Medications  Medication Sig Dispense Refill  . acetaminophen (TYLENOL) 325 MG tablet Take 650 mg by mouth every 6 (six) hours as needed for moderate pain.     Marland Kitchen amoxicillin-clavulanate (AUGMENTIN) 875-125 MG tablet Take 1 tablet by mouth 2 (two) times daily. 20 tablet 0  . apixaban (ELIQUIS) 5 MG TABS tablet Take 1 tablet (5 mg total) by mouth 2 (two) times daily. 180 tablet 3  . Ascorbic Acid (VITAMIN C) 1000 MG tablet Take 1,000 mg by mouth daily.    . Colchicine (MITIGARE) 0.6 MG CAPS Take one tablet and then a second tablet one hour later. Then take one tablet daily. 15 capsule 0  . diltiazem (CARDIZEM) 30 MG tablet Take 1 tablet (30 mg total) by mouth every 8 (eight) hours. 90 tablet 6  . esomeprazole (NEXIUM) 20 MG capsule Take 1 capsule (20 mg total) by mouth daily at 12 noon. (Patient taking differently: Take 20 mg by mouth daily. ) 90 capsule 2  . ferrous sulfate 325 (65 FE) MG tablet Take 1 tablet (325 mg total) by mouth 2 (two) times daily with a meal. (Patient taking differently: Take 325 mg by mouth daily with breakfast. )  3  . furosemide (LASIX) 20 MG tablet Take 1 tablet (20 mg total) by mouth 3 (three) times a week. (Mondays, Wednesdays, and Fridays) (Patient taking differently: Take 20 mg by mouth every Monday, Wednesday, and Friday. ) 30 tablet 5  . gabapentin (NEURONTIN) 600 MG tablet Take 1 tablet (  600 mg total) by mouth 3 (three) times daily. (Patient taking differently: Take 600 mg by mouth 2 (two) times daily. ) 90 tablet 3  . HYDROcodone-acetaminophen (NORCO/VICODIN) 5-325 MG tablet Take 1-2 tablets by mouth every 6 (six) hours as needed for moderate pain. 40 tablet 0  . LORazepam (ATIVAN) 0.5 MG tablet TAKE 1 TABLET BY MOUTH TWICE DAILY AS NEEDED FOR ANXIETY OR  SLEEP 60 tablet 0  . metoprolol (LOPRESSOR) 100 MG tablet Take 1 tablet (100 mg total) by mouth 2 (two) times daily. 180 tablet 3  . nitroGLYCERIN (NITROSTAT) 0.4 MG SL  tablet Place 1 tablet (0.4 mg total) under the tongue every 5 (five) minutes as needed for chest pain (x 3 pills). Reported on 09/01/2015 25 tablet 3  . omeprazole (PRILOSEC) 40 MG capsule Take 1 capsule (40 mg total) by mouth 2 (two) times daily. 60 capsule 2  . Polyethyl Glycol-Propyl Glycol (SYSTANE OP) Place 1 drop into both eyes daily.    . promethazine (PHENERGAN) 12.5 MG tablet TAKE 1 TABLET (12.5 MG TOTAL) BY MOUTH EVERY 8 HOURS AS NEEDED FOR NAUSEA OR VOMITING. (Patient taking differently: Take 12.5 mg by mouth every 8 (eight) hours as needed for nausea or vomiting. ) 20 tablet 1  . sertraline (ZOLOFT) 50 MG tablet Take 1 tablet (50 mg total) by mouth daily. 90 tablet 3  . traMADol (ULTRAM) 50 MG tablet Take 1 tablet (50 mg total) by mouth daily as needed for moderate pain. 60 tablet 0  . traZODone (DESYREL) 100 MG tablet Take 0.5 tablets (50 mg total) by mouth at bedtime. 30 tablet 5  . triamcinolone cream (KENALOG) 0.1 % Apply 1 application topically 2 (two) times daily as needed (Eczema on legs). 30 g 2   Current Facility-Administered Medications  Medication Dose Route Frequency Provider Last Rate Last Dose  . 0.9 %  sodium chloride infusion  500 mL Intravenous Continuous [name], MD        REVIEW OF SYSTEMS:   Constitutional: Denies fevers, chills or abnormal night sweats (+) Fatigue Eyes: Denies blurriness of vision, double vision or watery eyes Ears, nose, mouth, throat, and face: Denies mucositis or sore throat Respiratory: Denies cough, dyspnea or wheezes. No crackles, wheezes or rhonchi on exam. Cardiovascular: Denies palpitation, chest discomfort or  (+)lower extremity swelling Gastrointestinal:  Denies nausea, heartburn or change in bowel habits Skin: Denies abnormal skin rashes Lymphatics: Denies new lymphadenopathy or easy bruising Neurological:Denies numbness, tingling or new weaknesses Behavioral/Psych: Mood is stable, no new changes  MSK: (+) Right flank  pain. All other systems were reviewed with the patient and are negative.  PHYSICAL EXAMINATION:  ECOG PERFORMANCE STATUS: 2-3  Vitals:   07/23/17 1110  BP: 130/88  Pulse: 62  Resp: (!) 24  Temp: 98.3 F (36.8 C)  SpO2: 94%   Filed Weights   07/23/17 1110  Weight: 194 lb (88 kg)    GENERAL:alert, no distress and comfortable SKIN: skin color, texture, turgor are normal, no rashes or significant lesions EYES: normal, conjunctiva are pink and non-injected, sclera clear OROPHARYNX:no exudate, no erythema and lips, buccal mucosa, and tongue normal  NECK: supple, thyroid normal size, non-tender, without nodularity LYMPH:  no palpable lymphadenopathy in the cervical, axillary or inguinal LUNGS: clear to auscultation and percussion with normal breathing effort HEART: regular rate & rhythm and no murmurs and no lower extremity edema ABDOMEN:abdomen soft, the midline surgical wound is well healed, appears clean. (+) Mild diffuse tenderness at RLQ, no  hepatomegaly, normal bowel sounds. Musculoskeletal:no cyanosis of digits and no clubbing (+) Ambulatory with a walker. PSYCH: alert & oriented x 3 with fluent speech NEURO: no focal motor/sensory deficits.   LABORATORY DATA:  I have reviewed the data as listed CBC Latest Ref Rng & Units 07/23/2017 02/17/2017 12/25/2016  WBC 3.9 - 10.3 K/uL 6.3 6.5 6.3  Hemoglobin 11.6 - 15.9 g/dL 11.8 11.7 9.8(L)  Hematocrit 34.8 - 46.6 % 38.5 39.0 32.1(L)  Platelets 145 - 400 K/uL 126(L) 133(L) 150.0    CMP Latest Ref Rng & Units 07/23/2017 02/17/2017 10/26/2016  Glucose 70 - 140 mg/dL 93 102 106(H)  BUN 7 - 26 mg/dL 24 26.7(H) 25(H)  Creatinine 0.60 - 1.10 mg/dL 1.20(H) 1.3(H) 1.26(H)  Sodium 136 - 145 mmol/L 138 137 139  Potassium 3.3 - 4.7 mmol/L 4.2 4.6 4.0  Chloride 98 - 109 mmol/L 103 - 105  CO2 22 - 29 mmol/L '27 27 29  '$ Calcium 8.4 - 10.4 mg/dL 9.1 9.5 8.9  Total Protein 6.4 - 8.3 g/dL 7.3 7.2 7.0  Total Bilirubin 0.2 - 1.2 mg/dL 0.8 0.49 0.6   Alkaline Phos 40 - 150 U/L 63 61 60  AST 5 - 34 U/L 45(H) 30 41  ALT 0 - 55 U/L 43 17 32   Results for DERINDA, BARTUS (MRN [id number]) as of 09/16/2016 10:06  Ref. Range 03/01/2016 08:50 06/17/2016 09:50 09/10/2016 08:54  CEA Latest Ref Range: 0.0 - 4.7 ng/mL 3.1 2.6   CEA ([facility]) Latest Ref Range: 0.00 - 5.00 ng/mL 3.05 2.91 3.26   PATHOLOGY Diagnosis 12/25/2016 1. Surgical [P], transverse, polyp - TUBULAR ADENOMA (X4 FRAGMENTS). - NO HIGH GRADE DYSPLASIA OR MALIGNANCY. 2. Surgical [P], sigmoid, polyp - TUBULAR ADENOMA. - NO HIGH GRADE DYSPLASIA OR MALIGNANCY.  Colon, segmental resection for tumor, right 06/02/2014 - MULTIFOCAL INVASIVE ADENOCARCINOMA, SPANNING 8.5 CM AND 4.0 CM. - ADENOCARCINOMA INVOLVES THE SEROSA. - LYMPHOVASCULAR INVASION IS IDENTIFIED. 1 of 4 FINAL for Waugh, Charlsie C ([id number]) Diagnosis(continued) - PERINEURAL INVASION IS IDENTIFIED. - METASTATIC ADENOCARCINOMA IN 2 OF 16 LYMPH NODES (2/16) WITH EXTRACAPSULAR EXTENSION. - THE SURGICAL RESECTION MARGINS ARE NEGATIVE FOR ADENOCARCINOMA. - SEE ONCOLOGY TABLE BELOW. Microscopic Comment COLON AND RECTUM (INCLUDING TRANS-ANAL RESECTION): Specimen: Terminal ileum and right colon Procedure: Resection Tumor site: Ileocecal valve (tumor #1) and proximal mid ascending colon (tumor #2) Specimen integrity: Intact Macroscopic intactness of mesorectum: N/A Macroscopic tumor perforation: Not identified Invasive tumor: Tumor #1 (ileocecal valve) Maximum size: 8.5 cm (gross measurement) Histologic type(s): Adenocarcinoma Histologic grade and differentiation: G2: moderately differentiated/low grade Type of polyp in which invasive carcinoma arose: Likely tubular adenoma Microscopic extension of invasive tumor: Adenocarcinoma involves the serosa Invasive tumor: Tumor #2 (proximal mid ascending colon) Maximum size: 4.0 cm (gross measurement) Histologic type(s): Adenocarcinoma Histologic grade and differentiation:  G1: well differentiated/low grade Type of polyp in which invasive carcinoma arose: Tubulovillous adenoma Microscopic extension of invasive tumor: Adenocarcinoma extends into the muscularis propria Lymph-Vascular invasion: Present Peri-neural invasion: Present Tumor deposit(s) (discontinuous extramural extension): Not identified Resection margins: Proximal margin: 32.0 cm Distal margin: 7.5 cm Circumferential (radial) (posterior ascending, posterior descending; lateral and posterior mid-rectum; and entire lower 1/3 rectum): 3.0 cm Treatment effect (neo-adjuvant therapy): N/A Additional polyp(s): Not identified Non-neoplastic findings: No significant findings Lymph nodes: number examined 16; number positive: 2 Pathologic Staging: mT4, N1b Ancillary studies: MSI testing will be performed on the larger higher grade tumor and the results reported separately. Additional studies can be performed upon clinician request. Comments:  Grossly, there are two tumors identified. The larger tumor is located at the ileocecal valve and consists of an 8.5 cm span of moderately differentiated adenocarcinoma that involves the serosa. This tumor has associated perineural and lymphovascular invasion. The second discontiguous tumor is present in the proximal right colon and spans 4.0 cm and consists of well differentiated adenocarcinoma that is morphologically dissimilar from the larger tumor. This tumor extends into the muscularis propria. Enid Cutter MD Pathologist, Electronic Signature (Case signed 06/06/2014) Specimen Gross and Clinical Information  RADIOGRAPHIC STUDIES: I have personally reviewed the radiological images as listed and agreed with the findings in the report.  CT CAP 07/22/17 IMPRESSION: 1. No evidence of local tumor recurrence at ileocolic anastomosis in the right abdomen. 2. No evidence of metastatic disease in the abdomen or pelvis. 3. New nonspecific patchy ground-glass nodularity in  the right greater than left lungs. The ground-glass density is more suggestive of an inflammatory etiology. Recommend attention on follow-up chest CT in 3 months. 4. New nonspecific mild mediastinal lymphadenopathy. Given the above findings in the lungs, these nodes [place] be reactive, although short-term chest CT follow-up is again recommended in 3 months. 5. Liver surface appears finely irregular, question hepatic cirrhosis. Consider hepatic elastography for further liver fibrosis risk stratification, as clinically warranted. 6. Chronic findings include: Aortic Atherosclerosis (ICD10-I70.0). Left main and 3 vessel coronary atherosclerosis. Moderate hiatal hernia. Chronic mosaic attenuation in the lungs, most commonly due to air trapping from small airways disease.  CT CAP W/ CONTRAST 09/12/2016 IMPRESSION: 1. Status post partial right hemicolectomy, without evidence to suggest local recurrence of disease or metastatic disease to the chest, abdomen or pelvis. 2. Cardiomegaly with biatrial dilatation. 3. Aortic atherosclerosis, in addition to left main and 3 vessel coronary artery disease. 4. Moderate to large hiatal hernia.  Ct abdomen and pelvis with contrast 03/06/2016 IMPRESSION: 1. Stable remote surgical changes from a partial right hemicolectomy. No CT findings for recurrent tumor or metastatic disease. 2. Stable atherosclerotic calcifications involving the thoracic aorta, abdominal aorta and branch vessels including dense 3 vessel coronary artery calcifications. 3. Moderate to large hiatal hernia. 4. Scoliosis and degenerative changes involving the spine. 5. No acute findings in the chest, abdomen or pelvis.   ASSESSMENT & PLAN:  84 y.o. Caucasian female, with multiple medical comorbidities, presented with anemia, abdominal pain and nausea. She underwent exploratory laparotomy, right hemicolectomy, and descending colostomy by Dr. Georgette Dover on 06/02/2014. Surgical path reviewed multifocal  (2) colon cancer at  ileocecal valve and ascending colon.  1. Multifocal invasive colon adenocarcinoma, mT4N1M0, stage IIIB, grade 1, MMR normal. -I previously reviewed her surgical findings and her CT of abdomen and pelvis with patient and her extended family members in great detail today. Both of her colon cancer were completely surgically resected, however she does have high risk of cancer recurrence in the future due to locally advanced stage.  -due to her advanced age, adjuvant chemo was not recommended  -Her surveillance CT scan from 03/06/2016 showed no evidence of recurrence, I discussed the scan findings with patient and her daughter. -I reviewed her lab today. Her CBC and CMP are unremarkable except mild anemia which has been new for the past 3 months,  CEA has been normal. -Her clinical exam was unremarkable except mild right abd tenderness, which is not new  -Her recent colonoscopy was negative for local recurrence. -Due to her high risk recurrence, I obtained a CT scan on 09/12/16. This showed no evidence to suggest local recurrence of  disease or metastatic disease to the chest, abdomen or pelvis, cardiomegaly with biatrial dilatation, aortic atherosclerosis in addition to left main and 3 vessel coronary artery disease, and a moderate to large hiatal hernia. -Labs reviewed, anemia has resolved as of 02/17/17. Her exam was unremarkable today. There is no clinical concern for recurrence. -Discussed recent CT CAP images from 07/22/2017, no definitive evidence of recurrence.  She has bilateral patchy ground-glass changes in her lungs, which is new, with unknown etiology. I will copy her PCP and cardiologist  -We'll continue surveillance.   2. Genetics -Due to her family history of colon cancer, and her daughters multifocal polyps, I recommend her to have a genetic counseling. Her daughter met our genetic counselor before and genetic testing was negative. -She declined genetic testing at this  point.  3. Anemia -resolved now, continue oral iron supplement.   4. CAD, CHF -She will continue follow-up with her cardiologist Dr. Kathlen Mody -I discussed the CT scan findings of atherosclerosis, which involves her aorta and 3 coronary artery vessel, she does have frequent chest pain, I strongly encouraged her to see Dr. Kathlen Mody soon. -Pt is still taking Lasix and has minimal leg swelling. -Will get an ambulatory O2 saturation today. -Follow-up with her primary care physician for other medical issues.  5. Hiatal Hernia -We discussed that she should see Dr. Henrene Pastor about this.  6. CKD stage III -Cr 1.2-1.5 -Avoid nephrotoxic agents including IV contrast and NSAIDs.  7. Persistent cough and ground-glass changes in lungs  -she had pneumonia in 04/2017 and residual cough since then -reviewed CT CAP with pt, which showed some chronic lung inflammation. Encouraged her to follow up with PCP and I will send them my clinic notes.   PLAN I will copy her PCP Dr. Burnice Logan, Doretha Sou, MD about her abnormal CT chest  Lab and f/u in 6 months  All questions were answered. The patient knows to call the clinic with any problems, questions or concerns.  I spent 20 minutes counseling the patient face to face. The total time spent in the appointment was 25 minutes and more than 50% was on counseling.  This document serves as a record of services personally performed by Truitt Merle, MD. It was created on her behalf by Theresia Bough, a trained medical scribe. The creation of this record is based on the scribe's personal observations and the provider's statements to them.   I have reviewed the above documentation for accuracy and completeness, and I agree with the above.   Truitt Merle  07/23/2017

## 2017-07-23 NOTE — Telephone Encounter (Signed)
Scheduled appt per 1/9 los - Gave patient AVS and calender per los.  

## 2017-07-25 ENCOUNTER — Other Ambulatory Visit: Payer: Self-pay | Admitting: Hematology

## 2017-07-25 DIAGNOSIS — C182 Malignant neoplasm of ascending colon: Secondary | ICD-10-CM

## 2017-07-31 ENCOUNTER — Ambulatory Visit (HOSPITAL_COMMUNITY)
Admission: EM | Admit: 2017-07-31 | Discharge: 2017-07-31 | Disposition: A | Discharge status: Home / Self Care | Payer: PPO | Attending: Emergency Medicine | Admitting: Emergency Medicine

## 2017-07-31 ENCOUNTER — Encounter (HOSPITAL_COMMUNITY): Payer: Self-pay | Admitting: Emergency Medicine

## 2017-07-31 ENCOUNTER — Ambulatory Visit: Payer: Self-pay | Admitting: *Deleted

## 2017-07-31 ENCOUNTER — Ambulatory Visit (INDEPENDENT_AMBULATORY_CARE_PROVIDER_SITE_OTHER): Payer: PPO

## 2017-07-31 DIAGNOSIS — J918 Pleural effusion in other conditions classified elsewhere: Secondary | ICD-10-CM | POA: Diagnosis not present

## 2017-07-31 DIAGNOSIS — R0602 Shortness of breath: Secondary | ICD-10-CM

## 2017-07-31 NOTE — Telephone Encounter (Signed)
Pt  Reports   Shortness  Of  Breath -  With  Some pain  Both  Sides  r  Is  Worse  Than  The  Left    Voices  No overt  Chest  Pain     -  Is  Awake  And alert    Speaking in  Complete  sentances .  Pt advised   She  Needed  To  Be   Seen  Today   3  Way   With  Sunday Spillers  At  [facility]  Who  Agreed  And  Was  Advised she  Dimaria  Need  An  X  Ray  To  Go   Directly  To [facility]/ ER  To be  evaulated   Pt has  Family  Member  Present  Who  Will  Take  Her Reason for Disposition . [1] Longstanding difficulty breathing (e.g., CHF, COPD, emphysema) AND [2] WORSE than normal  Answer Assessment - Initial Assessment Questions 1. RESPIRATORY STATUS: "Describe your breathing?" (e.g., wheezing, shortness of breath, unable to speak, severe coughing)      SHORT  OF  BREATH  SLIGHT   COUGH   SLIGHT  WHEEZING  2. ONSET: "When did this breathing problem begin?"         2-3     DAYS    WORSE  LAST  PM    3. PATTERN "Does the difficult breathing come and go, or has it been constant since it started?"       WORSE  WHEN  SHE  LAYS   DOWN    SHORT  OF  BREATH  ON  EXERTION    4. SEVERITY: "How bad is your breathing?" (e.g., mild, moderate, severe)    - MILD: No SOB at rest, mild SOB with walking, speaks normally in sentences, can lay down, no retractions, pulse < 100.    - MODERATE: SOB at rest, SOB with minimal exertion and prefers to sit, cannot lie down flat, speaks in phrases, mild retractions, audible wheezing, pulse 100-120.    - SEVERE: Very SOB at rest, speaks in single words, struggling to breathe, sitting hunched forward, retractions, pulse > 120      MODERATE   5. RECURRENT SYMPTOM: "Have you had difficulty breathing before?" If so, ask: "When was the last time?" and "What happened that time?"        HAS   HAD  SHORTNESS  OF  BREATH   IN PAST  HAS   A  FIB    6. CARDIAC HISTORY: "Do you have any history of heart disease?" (e.g., heart attack, angina, bypass surgery, angioplasty)          A  FIB   CARDIAC   STINTS      7. LUNG HISTORY: "Do you have any history of lung disease?"  (e.g., pulmonary embolus, asthma, emphysema)          EMPHYSEMA   8. CAUSE: "What do you think is causing the breathing problem?"           POSSIBLE     LUNGS    9. OTHER SYMPTOMS: "Do you have any other symptoms? (e.g., dizziness, runny nose, cough, chest pain, fever)    PAIN  IN  BOTH SIDES   R  IS  WORSE  THAN  LEFT     10. PREGNANCY: "Is there any chance you are pregnant?" "When was your last menstrual period?"  N/A 11. TRAVEL: "Have you traveled out of the country in the last month?" (e.g., travel history, exposures)       NO  Protocols used: BREATHING DIFFICULTY-A-AH

## 2017-07-31 NOTE — Telephone Encounter (Signed)
Monitor for ER arrival 

## 2017-07-31 NOTE — ED Notes (Signed)
Spoke with pt for about 5 minutes on the importance of weighing herself every day at the same time wearing the same thing.  Pt states she has been told that before.  I encouraged the pt to weigh herself everyday with the addition of dosages of Lasix and to call her Cardiologist if she is not losing weight and is still having trouble with her breathing.  Pt and the person with her stated understanding.

## 2017-07-31 NOTE — Telephone Encounter (Signed)
Patient has arrived at [facility] urgent care now.

## 2017-07-31 NOTE — ED Triage Notes (Signed)
PT reports she had pneumonia in Oct. PT reports SOB when laying down, exertional SOB, weakness, pain over lower ribs, and a cough. PT reports she feels as though she never fully recovered from her illness in October.   His of COPD

## 2017-07-31 NOTE — ED Provider Notes (Signed)
[facility]    CSN: [id number] Arrival date & time: 07/31/17  1001     History   Chief Complaint Chief Complaint  Patient presents with  . Shortness of Breath    HPI [name] Un is a 84 y.o. female history of persistent Afib, CHF, and COPD presenting with shortness of breath for 2-3 days. She is also endorsing a soreness that is located under her ribs into her back. Had bilateral pneumonia in October 2018 and has not felt back to normal since, she has had persistent fatigue and cough. Shortness of breath is worse at night when tries to lie flat, has to sit up to catch breath. Minimal sleep last night. Also with rhinnorhea and cough. Positive smoking history- approximately 20 years, quit in 69. Also states she worked at [employer] and feels she was exposed to a chemical that has caused changes in her lungs. Using humidifier.   HPI  Past Medical History:  Diagnosis Date  . Adenocarcinoma, colon [facility]) Oncologist-- Dr Truitt Merle   Multifocal (2) Colon cancer @ ileocecal valve and ascending ( [id number]), Stage IIIB, grade I, MMR normal , 2 of 16 +lymph nodes, negative surgical margins---  06-02-2014  Right hemicolectomy w/ colostomy  . Allergy   . Anxiety   . Asthma    inhaler "sometimes"  . CAD (coronary artery disease)    a. LHC 4/09: pLAD 40, mDx 70-75, pLCx 30, mLCx 50, mRCA 90, EF 60% >> PCI: BMS x2 to RCA;  b. Myoview 8/12: normal  . Cataract    bil cateracts removed  . Chronic anemia   . Chronic diastolic CHF (congestive heart failure) (HCC)    a. Echo 912 - Mild LVH, EF 55-60%, no RWMA, Gr 1 DD, mild MR, mild LAE, mild RAE, PASP 59 mmHg (mod to severe pulmo HTN)  . Clotting disorder (HCC)    hx of dvt, tia on eliquis  . Colostomy in place [facility])    s/p colostomy takedown 5/16  . Colovaginal fistula    s/p colostomy >> colostomy takedown 5/16  . COPD with chronic bronchitis ([place])   . Depression   . Dysrhythmia    A fib  . Essential hypertension   . GERD  (gastroesophageal reflux disease)   . History of adenomatous polyp of colon   . History of blood transfusion   . History of DVT of lower extremity   . History of hiatal hernia   . History of TIA (transient ischemic attack)    a. Head CT 6/15: small chronic lacunar infarct in thalamus  . Hyperlipidemia   . OA (osteoarthritis)   . Osteoporosis   . Peripheral neuropathy   . Pneumonia   . Pulmonary HTN ([place])    a. PASP on Echo in 9/12:  59 mmHg  . Renal insufficiency   . Sigmoid diverticulosis    s/p sigmoid colectomy  . Stroke ([place])    TIAs  . Wears dentures   . Wears glasses     Patient Active Problem List   Diagnosis Date Noted  . Displaced fracture of lateral malleolus of right fibula, subsequent encounter for closed fracture with routine healing 12/27/2016  . Abdominal pain, epigastric 11/12/2016  . Nausea and vomiting 11/12/2016  . Iron deficiency anemia 11/12/2016  . Chronic anticoagulation 11/12/2016  . Chronic atrial fibrillation ([place]) 08/05/2016  . Coronary artery disease of native artery of native heart with stable angina pectoris ([place]) 08/05/2016  . Persistent atrial fibrillation ([place]) 08/25/2015  .  Failed total left knee replacement ([place]) 08/02/2015  . Sigmoid diverticulitis 11/24/2014  . Acute blood loss anemia 06/13/2014  . Neuropathic pain 06/13/2014  . Insomnia 06/13/2014  . Anxiety and depression 06/13/2014  . Cancer of ascending colon ([place]) 06/13/2014  . Small bowel obstruction ([place]) 05/31/2014  . Colovaginal fistula 04/20/2014  . Lethargy 09/15/2013  . DEPRESSION 02/07/2010  . DIASTOLIC HEART FAILURE, CHRONIC 02/27/2009  . PURE HYPERCHOLESTEROLEMIA 04/05/2008  . Coronary atherosclerosis 04/05/2008  . Osteoarthritis 05/19/2007  . Essential hypertension 01/21/2007  . GERD 01/21/2007  . Hiatal hernia 01/21/2007  . Diverticulosis of large intestine 01/21/2007  . COLONIC POLYPS, HX OF 01/21/2007    Past Surgical History:  Procedure Laterality Date    . ABDOMINAL HYSTERECTOMY    . ANTERIOR CERVICAL DECOMP/DISCECTOMY FUSION  01-31-2009   C5 -- 7  . APPENDECTOMY    . Bone spurs Bilateral    Feet  . CARDIOVASCULAR STRESS TEST  02-26-2011   Normal lexiscan no exercise study/  no ischemia/  normal LV function and wall motion, ef 67%  . CARPAL TUNNEL RELEASE Right   . CATARACT EXTRACTION W/ INTRAOCULAR LENS  IMPLANT, BILATERAL Bilateral   . CHOLECYSTECTOMY    . COLOSTOMY N/A 06/02/2014   Procedure: DIVERTING DESCENDING END COLOSTOMY;  Surgeon: Donnie Mesa, MD;  Location: [facility];  Service: General;  Laterality: N/A;  . CORONARY ANGIOPLASTY WITH STENT PLACEMENT  11-04-2007  dr bensimhon   BMS x2 to RCA/  mild Non-obstructive disease LAD/  normal LVF  . DILATION AND CURETTAGE OF UTERUS    . EVALUATION UNDER ANESTHESIA WITH ANAL FISTULECTOMY N/A 10/13/2014   Procedure: ANAL EXAM UNDER ANESTHESIA ;  Surgeon: [name], MD;  Location: [facility];  Service: General;  Laterality: N/A;  . HAND SURGERY     Tendon repair  . LAPAROSCOPIC SIGMOID COLECTOMY N/A 11/24/2014   Procedure: SIGMOID COLECTOMY AND COLSOTOMY CLOSURE;  Surgeon: Donnie Mesa, MD;  Location: [facility];  Service: General;  Laterality: N/A;  . LUMBAR LAMINECTOMY  10-29-2002,  1969   Left  L3 -- 4  decompression  . PARTIAL COLECTOMY N/A 06/02/2014   Procedure: RIGHT PARTIAL COLECTOMY ;  Surgeon: Donnie Mesa, MD;  Location: [place];  Service: General;  Laterality: N/A;  . PATELLECTOMY Right 09/14/2013   Procedure: PATELLECTOMY;  Surgeon: Meredith Pel, MD;  Location: [place];  Service: Orthopedics;  Laterality: Right;  . PROCTOSCOPY N/A 10/13/2014   Procedure: RIDGE PROCTOSCOPY;  Surgeon: [name], MD;  Location: [facility];  Service: General;  Laterality: N/A;  . REVISION TOTAL KNEE ARTHROPLASTY Bilateral right  04-02-2011/  left 1996 & 10-05-1999  . ROTATOR CUFF REPAIR Bilateral   . TONSILLECTOMY    . TOTAL KNEE ARTHROPLASTY Bilateral left 1994/  right 2000  . TOTAL KNEE REVISION  Left 08/02/2015   Procedure: LEFT FEMORAL REVISION;  Surgeon: Gaynelle Arabian, MD;  Location: [facility];  Service: Orthopedics;  Laterality: Left;  . TRANSTHORACIC ECHOCARDIOGRAM  12-01-2009   Grade I diastolic dysfunction/  ef 60%/  moderate MR/  mild TR    OB History    No data available       Home Medications    Prior to Admission medications   Medication Sig Start Date End Date Taking? Authorizing Provider  apixaban (ELIQUIS) 5 MG TABS tablet Take 1 tablet (5 mg total) by mouth 2 (two) times daily. 10/23/16  Yes Marletta Lor, MD  Ascorbic Acid (VITAMIN C) 1000 MG tablet Take 1,000 mg by mouth  daily.   Yes [provider]  diltiazem (CARDIZEM) 30 MG tablet Take 1 tablet (30 mg total) by mouth every 8 (eight) hours. 10/23/16  Yes Marletta Lor, MD  esomeprazole (NEXIUM) 20 MG capsule Take 1 capsule (20 mg total) by mouth daily at 12 noon. Patient taking differently: Take 20 mg by mouth daily.  10/23/16  Yes Marletta Lor, MD  ferrous sulfate 325 (65 FE) MG tablet Take 1 tablet (325 mg total) by mouth 2 (two) times daily with a meal. Patient taking differently: Take 325 mg by mouth daily with breakfast.  12/30/13  Yes Isaac Bliss, Rayford Halsted, MD  furosemide (LASIX) 20 MG tablet Take 1 tablet (20 mg total) by mouth 3 (three) times a week. (Mondays, Wednesdays, and Fridays) Patient taking differently: Take 20 mg by mouth every Monday, Wednesday, and Friday.  10/23/16  Yes Marletta Lor, MD  gabapentin (NEURONTIN) 600 MG tablet Take 1 tablet (600 mg total) by mouth 3 (three) times daily. Patient taking differently: Take 600 mg by mouth 2 (two) times daily.  10/23/16  Yes Marletta Lor, MD  traZODone (DESYREL) 100 MG tablet Take 0.5 tablets (50 mg total) by mouth at bedtime. 10/23/16  Yes Marletta Lor, MD  acetaminophen (TYLENOL) 325 MG tablet Take 650 mg by mouth every 6 (six) hours as needed for moderate pain.     [provider]    amoxicillin-clavulanate (AUGMENTIN) 875-125 MG tablet Take 1 tablet by mouth 2 (two) times daily. 04/24/17   Marletta Lor, MD  Colchicine (MITIGARE) 0.6 MG CAPS Take one tablet and then a second tablet one hour later. Then take one tablet daily. 02/10/17   Pete Pelt, PA-C  HYDROcodone-acetaminophen (NORCO/VICODIN) 5-325 MG tablet Take 1-2 tablets by mouth every 6 (six) hours as needed for moderate pain. 04/24/17   Marletta Lor, MD  LORazepam (ATIVAN) 0.5 MG tablet TAKE 1 TABLET BY MOUTH TWICE DAILY AS NEEDED FOR ANXIETY OR  SLEEP 07/02/17   Marletta Lor, MD  metoprolol (LOPRESSOR) 100 MG tablet Take 1 tablet (100 mg total) by mouth 2 (two) times daily. 10/23/16   Marletta Lor, MD  nitroGLYCERIN (NITROSTAT) 0.4 MG SL tablet Place 1 tablet (0.4 mg total) under the tongue every 5 (five) minutes as needed for chest pain (x 3 pills). Reported on 09/01/2015 10/23/16   Marletta Lor, MD  omeprazole (PRILOSEC) 40 MG capsule Take 1 capsule (40 mg total) by mouth 2 (two) times daily. 11/12/16   Zehr, Laban Emperor, PA-C  Polyethyl Glycol-Propyl Glycol (SYSTANE OP) Place 1 drop into both eyes daily.    [provider]  promethazine (PHENERGAN) 12.5 MG tablet TAKE 1 TABLET (12.5 MG TOTAL) BY MOUTH EVERY 8 HOURS AS NEEDED FOR NAUSEA OR VOMITING. Patient taking differently: Take 12.5 mg by mouth every 8 (eight) hours as needed for nausea or vomiting.  10/23/16   Marletta Lor, MD  sertraline (ZOLOFT) 50 MG tablet Take 1 tablet (50 mg total) by mouth daily. 10/23/16   Marletta Lor, MD  traMADol (ULTRAM) 50 MG tablet Take 1 tablet (50 mg total) by mouth daily as needed for moderate pain. 02/26/17   Marletta Lor, MD  triamcinolone cream (KENALOG) 0.1 % Apply 1 application topically 2 (two) times daily as needed (Eczema on legs). 10/23/16   Marletta Lor, MD    Family History Family History  Problem Relation Age of Onset  . Osteoarthritis  Mother   .  Heart failure Mother   . Colon cancer Maternal Uncle 73  . Heart Problems Father   . Cancer Sister 25       ? colon cancer   . Kidney cancer Brother 82  . Esophageal cancer Neg Hx   . Rectal cancer Neg Hx   . Stomach cancer Neg Hx   . Pancreatic cancer Neg Hx     Social History Social History   Tobacco Use  . Smoking status: Former Smoker    Packs/day: 0.50    Years: 15.00    Pack years: 7.50    Types: Cigarettes    Last attempt to quit: 07/16/1967    Years since quitting: 50.0  . Smokeless tobacco: Never Used  Substance Use Topics  . Alcohol use: No    Alcohol/week: 0.0 oz  . Drug use: No     Allergies   Patient has no known allergies.   Review of Systems Review of Systems  Constitutional: Positive for fatigue. Negative for chills and fever.  HENT: Positive for congestion, postnasal drip and rhinorrhea. Negative for ear pain, sinus pressure, sore throat and trouble swallowing.   Respiratory: Positive for cough and shortness of breath. Negative for chest tightness.        PND  Cardiovascular: Negative for chest pain.  Gastrointestinal: Positive for abdominal pain and nausea. Negative for vomiting.  Musculoskeletal: Negative for myalgias.  Skin: Negative for rash.  Neurological: Negative for dizziness, light-headedness and headaches.     Physical Exam Triage Vital Signs ED Triage Vitals  Enc Vitals Group     BP 07/31/17 1030 131/78     Pulse Rate 07/31/17 1030 88     Resp 07/31/17 1030 20     Temp 07/31/17 1030 97.6 F (36.4 C)     Temp Source 07/31/17 1030 Oral     SpO2 07/31/17 1030 92 %     Weight 07/31/17 1027 185 lb (83.9 kg)     Height --      Head Circumference --      Peak Flow --      Pain Score 07/31/17 1028 6     Pain Loc --      Pain Edu? --      Excl. in [place]? --    No data found.  Updated Vital Signs BP 131/78   Pulse 88   Temp 97.6 F (36.4 C) (Oral)   Resp 20   Wt 185 lb (83.9 kg)   SpO2 92%   BMI 32.26 kg/m    Physical Exam  Constitutional: She is oriented to person, place, and time. She appears well-developed and well-nourished. No distress.  HENT:  Head: Normocephalic and atraumatic.  Right Ear: Tympanic membrane and ear canal normal.  Left Ear: Tympanic membrane and ear canal normal.  Mouth/Throat: Uvula is midline. Mucous membranes are dry. No oral lesions. No trismus in the jaw. No uvula swelling. No posterior oropharyngeal erythema.  Eyes: Conjunctivae are normal.  Neck: Neck supple.  Cardiovascular: Normal rate.  No murmur heard. Irregularly irregular rhythm  Pulmonary/Chest: Effort normal. No respiratory distress. She has wheezes.  Diffuse expiratory wheezes throughout  Abdominal: Soft. There is no tenderness.  Musculoskeletal:       Right lower leg: She exhibits edema.       Left lower leg: She exhibits edema.  1 + pitting edema bialterally  Neurological: She is alert and oriented to person, place, and time.  Skin: Skin is warm and dry.  Psychiatric: She  has a normal mood and affect.  Nursing note and vitals reviewed.    UC Treatments / Results  Labs (all labs ordered are listed, but only abnormal results are displayed) Labs Reviewed - No data to display  EKG  EKG Interpretation None       Radiology Dg Chest 2 View  Result Date: 07/31/2017 CLINICAL DATA:  Shortness of breath. EXAM: CHEST  2 VIEW COMPARISON:  CT 07/22/2016. FINDINGS: Mediastinum hilar structures normal. Cardiomegaly with mild pulmonary vascular congestion. Mild basilar interstitial prominence. Small bilateral pleural effusions. Findings suggest mild CHF. No pneumothorax. Prior cervical spine fusion. IMPRESSION: Cardiomegaly with mild pulmonary vascular congestion. Mild interstitial edema cannot be excluded. Small bilateral pleural effusions. Findings suggest mild CHF. Electronically Signed   By: Marcello Moores  Register   On: 07/31/2017 11:10    Procedures Procedures (including critical care  time)  Medications Ordered in UC Medications - No data to display   Initial Impression / Assessment and Plan / UC Course  I have reviewed the triage vital signs and the nursing notes.  Pertinent labs & imaging results that were available during my care of the patient were reviewed by me and considered in my medical decision making (see chart for details).     Chest XR and history consistent with CHF. 92% O2, baseline appears to be around 94%. Will increase lasix today to 40 and daily for next 3 days. Return to normal schedule on Monday. Daily weights. Follow up with PCP, here or cardiology early next week, earlier if worsening. Discussed strict return precautions. Go to ED if shortness of breath worsening or swelling increasing. Patient verbalized understanding and is agreeable with plan.   Final Clinical Impressions(s) / UC Diagnoses   Final diagnoses:  SOB (shortness of breath)    ED Discharge Orders    None       Controlled Substance Prescriptions [place] Controlled Substance Registry consulted? Not Applicable   Janith Lima, PA-C 07/31/17 2240    Shadoe Cryan, [place] C, [place] 07/31/17 2242

## 2017-07-31 NOTE — Discharge Instructions (Signed)
Shortness of breath appears to be related to your CHF.  Please take an additional lasix 20 mg later this afternoon. Then take lasix 20 mg daily trough Sunday. Return to your normal Monday, Wednesday, Friday schedule after.   Please follow up either here or with primary provider on Monday or early next week. If symptoms worsening in the mean time please return sooner. If you develop significant shortness of breath and difficulty breathing please go to the emergency room.   Please try to follow up with Cardiology soon also.

## 2017-07-31 NOTE — Telephone Encounter (Signed)
Please advise 

## 2017-08-04 ENCOUNTER — Encounter: Payer: Self-pay | Admitting: Internal Medicine

## 2017-08-04 ENCOUNTER — Ambulatory Visit (INDEPENDENT_AMBULATORY_CARE_PROVIDER_SITE_OTHER): Payer: PPO | Admitting: Internal Medicine

## 2017-08-04 ENCOUNTER — Encounter: Payer: Self-pay | Admitting: Cardiology

## 2017-08-04 ENCOUNTER — Ambulatory Visit: Payer: PPO | Admitting: Cardiology

## 2017-08-04 VITALS — BP 122/62 | HR 72 | Temp 97.8°F | Ht 63.5 in | Wt 188.2 lb

## 2017-08-04 VITALS — BP 130/84 | HR 77 | Resp 16 | Ht 63.0 in | Wt 188.1 lb

## 2017-08-04 DIAGNOSIS — I481 Persistent atrial fibrillation: Secondary | ICD-10-CM

## 2017-08-04 DIAGNOSIS — I5032 Chronic diastolic (congestive) heart failure: Secondary | ICD-10-CM

## 2017-08-04 DIAGNOSIS — R0600 Dyspnea, unspecified: Secondary | ICD-10-CM

## 2017-08-04 DIAGNOSIS — Z5181 Encounter for therapeutic drug level monitoring: Secondary | ICD-10-CM

## 2017-08-04 DIAGNOSIS — I4819 Other persistent atrial fibrillation: Secondary | ICD-10-CM

## 2017-08-04 DIAGNOSIS — R0601 Orthopnea: Secondary | ICD-10-CM

## 2017-08-04 DIAGNOSIS — R079 Chest pain, unspecified: Secondary | ICD-10-CM

## 2017-08-04 LAB — BRAIN NATRIURETIC PEPTIDE: Pro B Natriuretic peptide (BNP): 366 pg/mL — ABNORMAL HIGH (ref 0.0–100.0)

## 2017-08-04 MED ORDER — FUROSEMIDE 20 MG PO TABS: 20.0000 mg | ORAL_TABLET | Freq: Every day | ORAL | 2 refills | Status: DC

## 2017-08-04 MED ORDER — HYDROCODONE-ACETAMINOPHEN 5-325 MG PO TABS: 1.0000 | ORAL_TABLET | Freq: Four times a day (QID) | ORAL | 0 refills | Status: DC | PRN

## 2017-08-04 MED ORDER — COLCHICINE 0.6 MG PO CAPS: ORAL_CAPSULE | ORAL | 4 refills | Status: DC

## 2017-08-04 MED ORDER — LORAZEPAM 0.5 MG PO TABS: ORAL_TABLET | ORAL | 0 refills | Status: DC

## 2017-08-04 NOTE — Patient Instructions (Signed)
Limit your sodium (Salt) intake  Cardiology follow-up as scheduled  Return in one month for follow-up  Continue furosemide 20 mg daily

## 2017-08-04 NOTE — Progress Notes (Signed)
Subjective:    Patient ID: [name], female    DOB: 1930/02/01, 84 y.o.   MRN: [id number]  HPI  84 year old patient who is seen today following a recent ED visit 4 days ago. She presented with acute shortness of breath that prompted the ED visit. She gives a history of orthopnea and has slept in a recliner for some time.  She has had worsening DOE for the past week. She was seen in the fall and treated for a community-acquired pneumonia involving the right middle lobe. Furosemide has been increased to 20 mg daily from a 3 time a week regimen with improvement.  She states that earlier she did have some significant lower extremity edema  Wt Readings from Last 3 Encounters:  08/04/17 188 lb 3.2 oz (85.4 kg)  07/31/17 185 lb (83.9 kg)  07/23/17 194 lb (88 kg)   A 2D echocardiogram 2 years ago revealed normal LV function.  She is scheduled for cardiology follow-up later this afternoon.  Denies any productive cough.  She has a history of chronic atrial fibrillation and remains on chronic anticoagulation.  A recent chest CT for colon cancer surveillance revealed coronary artery atherosclerotic changes. Axillary adenopathy and new mildly enlarged paratracheal lymph nodes noted.  She groundglass nodularity was described in the lower lung fields  Past Medical History:  Diagnosis Date  . Adenocarcinoma, colon [facility]) Oncologist-- Dr Truitt Merle   Multifocal (2) Colon cancer @ ileocecal valve and ascending ( [id number]), Stage IIIB, grade I, MMR normal , 2 of 16 +lymph nodes, negative surgical margins---  06-02-2014  Right hemicolectomy w/ colostomy  . Allergy   . Anxiety   . Asthma    inhaler "sometimes"  . CAD (coronary artery disease)    a. LHC 4/09: pLAD 40, mDx 70-75, pLCx 30, mLCx 50, mRCA 90, EF 60% >> PCI: BMS x2 to RCA;  b. Myoview 8/12: normal  . Cataract    bil cateracts removed  . Chronic anemia   . Chronic diastolic CHF (congestive heart failure) (HCC)    a. Echo 912 - Mild  LVH, EF 55-60%, no RWMA, Gr 1 DD, mild MR, mild LAE, mild RAE, PASP 59 mmHg (mod to severe pulmo HTN)  . Clotting disorder (HCC)    hx of dvt, tia on eliquis  . Colostomy in place [facility])    s/p colostomy takedown 5/16  . Colovaginal fistula    s/p colostomy >> colostomy takedown 5/16  . COPD with chronic bronchitis ([place])   . Depression   . Dysrhythmia    A fib  . Essential hypertension   . GERD (gastroesophageal reflux disease)   . History of adenomatous polyp of colon   . History of blood transfusion   . History of DVT of lower extremity   . History of hiatal hernia   . History of TIA (transient ischemic attack)    a. Head CT 6/15: small chronic lacunar infarct in thalamus  . Hyperlipidemia   . OA (osteoarthritis)   . Osteoporosis   . Peripheral neuropathy   . Pneumonia   . Pulmonary HTN ([place])    a. PASP on Echo in 9/12:  59 mmHg  . Renal insufficiency   . Sigmoid diverticulosis    s/p sigmoid colectomy  . Stroke ([place])    TIAs  . Wears dentures   . Wears glasses      Social History   Socioeconomic History  . Marital status: Widowed    Spouse name: Not  on file  . Number of children: Not on file  . Years of education: Not on file  . Highest education level: Not on file  Social Needs  . Financial resource strain: Not on file  . Food insecurity - worry: Not on file  . Food insecurity - inability: Not on file  . Transportation needs - medical: Not on file  . Transportation needs - non-medical: Not on file  Occupational History  . Not on file  Tobacco Use  . Smoking status: Former Smoker    Packs/day: 0.50    Years: 15.00    Pack years: 7.50    Types: Cigarettes    Last attempt to quit: 07/16/1967    Years since quitting: 50.0  . Smokeless tobacco: Never Used  Substance and Sexual Activity  . Alcohol use: No    Alcohol/week: 0.0 oz  . Drug use: No  . Sexual activity: Not on file  Other Topics Concern  . Not on file  Social History Narrative   Married, 2  children.  As of November 2015 her husband has been living in a nursing home for tendon after years as he suffers from multiple medical problems.   Patient does not drink alcohol nor does she smoke or chew tobacco products   Right handed   8th grade   1 cup daily    Past Surgical History:  Procedure Laterality Date  . ABDOMINAL HYSTERECTOMY    . ANTERIOR CERVICAL DECOMP/DISCECTOMY FUSION  01-31-2009   C5 -- 7  . APPENDECTOMY    . Bone spurs Bilateral    Feet  . CARDIOVASCULAR STRESS TEST  02-26-2011   Normal lexiscan no exercise study/  no ischemia/  normal LV function and wall motion, ef 67%  . CARPAL TUNNEL RELEASE Right   . CATARACT EXTRACTION W/ INTRAOCULAR LENS  IMPLANT, BILATERAL Bilateral   . CHOLECYSTECTOMY    . COLOSTOMY N/A 06/02/2014   Procedure: DIVERTING DESCENDING END COLOSTOMY;  Surgeon: Donnie Mesa, MD;  Location: [place];  Service: General;  Laterality: N/A;  . CORONARY ANGIOPLASTY WITH STENT PLACEMENT  11-04-2007  dr bensimhon   BMS x2 to RCA/  mild Non-obstructive disease LAD/  normal LVF  . DILATION AND CURETTAGE OF UTERUS    . EVALUATION UNDER ANESTHESIA WITH ANAL FISTULECTOMY N/A 10/13/2014   Procedure: ANAL EXAM UNDER ANESTHESIA ;  Surgeon: [name], MD;  Location: [facility];  Service: General;  Laterality: N/A;  . HAND SURGERY     Tendon repair  . LAPAROSCOPIC SIGMOID COLECTOMY N/A 11/24/2014   Procedure: SIGMOID COLECTOMY AND COLSOTOMY CLOSURE;  Surgeon: Donnie Mesa, MD;  Location: [place];  Service: General;  Laterality: N/A;  . LUMBAR LAMINECTOMY  10-29-2002,  1969   Left  L3 -- 4  decompression  . PARTIAL COLECTOMY N/A 06/02/2014   Procedure: RIGHT PARTIAL COLECTOMY ;  Surgeon: Donnie Mesa, MD;  Location: [place];  Service: General;  Laterality: N/A;  . PATELLECTOMY Right 09/14/2013   Procedure: PATELLECTOMY;  Surgeon: Meredith Pel, MD;  Location: [place];  Service: Orthopedics;  Laterality: Right;  . PROCTOSCOPY N/A 10/13/2014   Procedure: RIDGE  PROCTOSCOPY;  Surgeon: [name], MD;  Location: [facility];  Service: General;  Laterality: N/A;  . REVISION TOTAL KNEE ARTHROPLASTY Bilateral right  04-02-2011/  left 1996 & 10-05-1999  . ROTATOR CUFF REPAIR Bilateral   . TONSILLECTOMY    . TOTAL KNEE ARTHROPLASTY Bilateral left 1994/  right 2000  . TOTAL KNEE REVISION Left  08/02/2015   Procedure: LEFT FEMORAL REVISION;  Surgeon: Gaynelle Arabian, MD;  Location: [facility];  Service: Orthopedics;  Laterality: Left;  . TRANSTHORACIC ECHOCARDIOGRAM  12-01-2009   Grade I diastolic dysfunction/  ef 60%/  moderate MR/  mild TR    Family History  Problem Relation Age of Onset  . Osteoarthritis Mother   . Heart failure Mother   . Colon cancer Maternal Uncle 82  . Heart Problems Father   . Cancer Sister 33       ? colon cancer   . Kidney cancer Brother 50  . Esophageal cancer Neg Hx   . Rectal cancer Neg Hx   . Stomach cancer Neg Hx   . Pancreatic cancer Neg Hx     No Known Allergies  Current Outpatient Medications on File Prior to Visit  Medication Sig Dispense Refill  . acetaminophen (TYLENOL) 325 MG tablet Take 650 mg by mouth every 6 (six) hours as needed for moderate pain.     Marland Kitchen apixaban (ELIQUIS) 5 MG TABS tablet Take 1 tablet (5 mg total) by mouth 2 (two) times daily. 180 tablet 3  . Ascorbic Acid (VITAMIN C) 1000 MG tablet Take 1,000 mg by mouth daily.    Marland Kitchen diltiazem (CARDIZEM) 30 MG tablet Take 1 tablet (30 mg total) by mouth every 8 (eight) hours. 90 tablet 6  . esomeprazole (NEXIUM) 20 MG capsule Take 1 capsule (20 mg total) by mouth daily at 12 noon. (Patient taking differently: Take 20 mg by mouth daily. ) 90 capsule 2  . ferrous sulfate 325 (65 FE) MG tablet Take 1 tablet (325 mg total) by mouth 2 (two) times daily with a meal. (Patient taking differently: Take 325 mg by mouth daily with breakfast. )  3  . gabapentin (NEURONTIN) 600 MG tablet Take 1 tablet (600 mg total) by mouth 3 (three) times daily. (Patient taking differently:  Take 600 mg by mouth 2 (two) times daily. ) 90 tablet 3  . metoprolol (LOPRESSOR) 100 MG tablet Take 1 tablet (100 mg total) by mouth 2 (two) times daily. 180 tablet 3  . nitroGLYCERIN (NITROSTAT) 0.4 MG SL tablet Place 1 tablet (0.4 mg total) under the tongue every 5 (five) minutes as needed for chest pain (x 3 pills). Reported on 09/01/2015 25 tablet 3  . omeprazole (PRILOSEC) 40 MG capsule Take 1 capsule (40 mg total) by mouth 2 (two) times daily. (Patient taking differently: Take 20 mg by mouth 2 (two) times daily. ) 60 capsule 2  . Polyethyl Glycol-Propyl Glycol (SYSTANE OP) Place 1 drop into both eyes daily.    . promethazine (PHENERGAN) 12.5 MG tablet TAKE 1 TABLET (12.5 MG TOTAL) BY MOUTH EVERY 8 HOURS AS NEEDED FOR NAUSEA OR VOMITING. (Patient taking differently: Take 12.5 mg by mouth every 8 (eight) hours as needed for nausea or vomiting. ) 20 tablet 1  . sertraline (ZOLOFT) 50 MG tablet Take 1 tablet (50 mg total) by mouth daily. 90 tablet 3  . traMADol (ULTRAM) 50 MG tablet Take 1 tablet (50 mg total) by mouth daily as needed for moderate pain. 60 tablet 0  . traZODone (DESYREL) 100 MG tablet Take 0.5 tablets (50 mg total) by mouth at bedtime. 30 tablet 5  . triamcinolone cream (KENALOG) 0.1 % Apply 1 application topically 2 (two) times daily as needed (Eczema on legs). 30 g 2   Current Facility-Administered Medications on File Prior to Visit  Medication Dose Route Frequency Provider Last Rate Last Dose  .  0.9 %  sodium chloride infusion  500 mL Intravenous Continuous [name], MD        BP 122/62 (BP Location: Left Arm, Patient Position: Sitting, Cuff Size: Normal)   Pulse 72   Temp 97.8 F (36.6 C) (Oral)   Ht 5' 3.5" (1.613 m)   Wt 188 lb 3.2 oz (85.4 kg)   SpO2 90%   BMI 32.82 kg/m     Review of Systems  Constitutional: Negative.   HENT: Negative for congestion, dental problem, hearing loss, rhinorrhea, sinus pressure, sore throat and tinnitus.   Eyes: Negative for  pain, discharge and visual disturbance.  Respiratory: Positive for shortness of breath. Negative for cough.   Cardiovascular: Positive for leg swelling. Negative for chest pain and palpitations.  Gastrointestinal: Negative for abdominal distention, abdominal pain, blood in stool, constipation, diarrhea, nausea and vomiting.  Genitourinary: Negative for difficulty urinating, dysuria, flank pain, frequency, hematuria, pelvic pain, urgency, vaginal bleeding, vaginal discharge and vaginal pain.  Musculoskeletal: Negative for arthralgias, gait problem and joint swelling.  Skin: Negative for rash.  Neurological: Negative for dizziness, syncope, speech difficulty, weakness, numbness and headaches.  Hematological: Negative for adenopathy.  Psychiatric/Behavioral: Negative for agitation, behavioral problems and dysphoric mood. The patient is not nervous/anxious.        Objective:   Physical Exam  Constitutional: She is oriented to person, place, and time. She appears well-developed and well-nourished.  HENT:  Head: Normocephalic.  Right Ear: External ear normal.  Left Ear: External ear normal.  Mouth/Throat: Oropharynx is clear and moist.  Eyes: Conjunctivae and EOM are normal. Pupils are equal, round, and reactive to light.  Neck: Normal range of motion. Neck supple. No thyromegaly present.  Cardiovascular: Normal rate, normal heart sounds and intact distal pulses.  Irregular rhythm with a controlled ventricular response  Pulmonary/Chest: Effort normal and breath sounds normal.  A few basilar crackles  Abdominal: Soft. Bowel sounds are normal. She exhibits no mass. There is no tenderness.  Musculoskeletal: Normal range of motion. She exhibits no edema.  Lymphadenopathy:    She has no cervical adenopathy.  Neurological: She is alert and oriented to person, place, and time.  Skin: Skin is warm and dry. No rash noted.  Psychiatric: She has a normal mood and affect. Her behavior is normal.           Assessment & Plan:   History of orthopnea and probable episode of PND.  History of DOE. We will check a BNP.  Cardiology follow-up as scheduled continue to 20  milligrams of furosemide daily  Chronic atrial fibrillation  Nonspecific mild mediastinal lymphadenopathy with new nonspecific nodularity in the lower lung fields.  Consider follow-up chest CT in 3 months  Coronary artery and aortic atherosclerosis.  Consider low intensity statin therapy  Follow-up 1 month  [id number],PETER Pilar Plate

## 2017-08-04 NOTE — Progress Notes (Signed)
08/04/2017 Gerhard Munch Traynor   04-03-1930  [id number]  Primary Physician Marletta Lor, MD Primary Cardiologist: Dr. Acie Fredrickson    Reason for Visit/CC: Dyspnea   HPI:  [name] is a 84 y.o. female with history of CAD s/p BMS to RCA x 2 in 0109, diastolic CHF, HTN, HLD, persistent afib on Eliquis, chronic dyspnea on exertion, COPD, pulmonary HTN, ? prior TIA, prior DVT, colon CA who presentes for evaluation of dyspnea and acute CHF.   Pt was seen at a local urgent care last week for examination given recent symptoms. Pt noted exertional dyspnea, LEE and orthopnea, having to sleep in recliner.   Pt was felt to be volume overloaded and was given instruction to increase Lasix from 3 days a week to daily.   Today, she had f/u with her PCP, Dr. Bluford Kaufmann for f/u. Pt has had improvement in symptoms but still with mild exertional dyspnea. LEE has resolved. She is still unable to sleep in bed. She also denies notable fatigue. Her friend, who is with her today, notes that she looks paler than normal. Pt denies melena or hematochezia. She also has exertional chest tightness from time to time that improves with rest. No resting symptoms. No use of SL NTG. She is very independent. She lives home alone and does all of her cooking and cleaning herself. Her PCP checked a BNP today and it was abnormal at 366. No BMP was ordered. EKG today shows chronic afib w/ CVR in the 70s.    Current Meds  Medication Sig  . acetaminophen (TYLENOL) 325 MG tablet Take 650 mg by mouth every 6 (six) hours as needed for moderate pain.   Marland Kitchen apixaban (ELIQUIS) 5 MG TABS tablet Take 1 tablet (5 mg total) by mouth 2 (two) times daily.  . Ascorbic Acid (VITAMIN C) 1000 MG tablet Take 1,000 mg by mouth daily.  Marland Kitchen diltiazem (CARDIZEM) 30 MG tablet Take 1 tablet (30 mg total) by mouth every 8 (eight) hours.  Marland Kitchen esomeprazole (NEXIUM) 20 MG capsule Take 1 capsule (20 mg total) by mouth daily at 12 noon. (Patient taking  differently: Take 20 mg by mouth daily. )  . ferrous sulfate 325 (65 FE) MG tablet Take 1 tablet (325 mg total) by mouth 2 (two) times daily with a meal. (Patient taking differently: Take 325 mg by mouth daily with breakfast. )  . furosemide (LASIX) 20 MG tablet Take 1 tablet (20 mg total) by mouth daily. (Mondays, Wednesdays, and Fridays)  . gabapentin (NEURONTIN) 600 MG tablet Take 1 tablet (600 mg total) by mouth 3 (three) times daily. (Patient taking differently: Take 600 mg by mouth 2 (two) times daily. )  . HYDROcodone-acetaminophen (NORCO/VICODIN) 5-325 MG tablet Take 1-2 tablets by mouth every 6 (six) hours as needed for moderate pain.  Marland Kitchen LORazepam (ATIVAN) 0.5 MG tablet TAKE 1 TABLET BY MOUTH TWICE DAILY AS NEEDED FOR ANXIETY OR  SLEEP  . metoprolol (LOPRESSOR) 100 MG tablet Take 1 tablet (100 mg total) by mouth 2 (two) times daily.  . nitroGLYCERIN (NITROSTAT) 0.4 MG SL tablet Place 1 tablet (0.4 mg total) under the tongue every 5 (five) minutes as needed for chest pain (x 3 pills). Reported on 09/01/2015  . promethazine (PHENERGAN) 12.5 MG tablet TAKE 1 TABLET (12.5 MG TOTAL) BY MOUTH EVERY 8 HOURS AS NEEDED FOR NAUSEA OR VOMITING. (Patient taking differently: Take 12.5 mg by mouth every 8 (eight) hours as needed for nausea or vomiting. )  .  sertraline (ZOLOFT) 50 MG tablet Take 1 tablet (50 mg total) by mouth daily.  . traZODone (DESYREL) 100 MG tablet Take 0.5 tablets (50 mg total) by mouth at bedtime.  . triamcinolone cream (KENALOG) 0.1 % Apply 1 application topically 2 (two) times daily as needed (Eczema on legs).  . [DISCONTINUED] Colchicine (MITIGARE) 0.6 MG CAPS Take one tablet and then a second tablet one hour later. Then take one tablet daily.  . [DISCONTINUED] omeprazole (PRILOSEC) 40 MG capsule Take 1 capsule (40 mg total) by mouth 2 (two) times daily. (Patient taking differently: Take 20 mg by mouth 2 (two) times daily. )  . [DISCONTINUED] Polyethyl Glycol-Propyl Glycol (SYSTANE  OP) Place 1 drop into both eyes daily.  . [DISCONTINUED] traMADol (ULTRAM) 50 MG tablet Take 1 tablet (50 mg total) by mouth daily as needed for moderate pain.   Current Facility-Administered Medications for the 08/04/17 encounter (Office Visit) with Consuelo Pandy, PA-C  Medication  . 0.9 %  sodium chloride infusion   No Known Allergies Past Medical History:  Diagnosis Date  . Adenocarcinoma, colon [facility]) Oncologist-- Dr Truitt Merle   Multifocal (2) Colon cancer @ ileocecal valve and ascending ( [id number]), Stage IIIB, grade I, MMR normal , 2 of 16 +lymph nodes, negative surgical margins---  06-02-2014  Right hemicolectomy w/ colostomy  . Allergy   . Anxiety   . Asthma    inhaler "sometimes"  . CAD (coronary artery disease)    a. LHC 4/09: pLAD 40, mDx 70-75, pLCx 30, mLCx 50, mRCA 90, EF 60% >> PCI: BMS x2 to RCA;  b. Myoview 8/12: normal  . Cataract    bil cateracts removed  . Chronic anemia   . Chronic diastolic CHF (congestive heart failure) (HCC)    a. Echo 912 - Mild LVH, EF 55-60%, no RWMA, Gr 1 DD, mild MR, mild LAE, mild RAE, PASP 59 mmHg (mod to severe pulmo HTN)  . Clotting disorder (HCC)    hx of dvt, tia on eliquis  . Colostomy in place [facility])    s/p colostomy takedown 5/16  . Colovaginal fistula    s/p colostomy >> colostomy takedown 5/16  . COPD with chronic bronchitis ([place])   . Depression   . Dysrhythmia    A fib  . Essential hypertension   . GERD (gastroesophageal reflux disease)   . History of adenomatous polyp of colon   . History of blood transfusion   . History of DVT of lower extremity   . History of hiatal hernia   . History of TIA (transient ischemic attack)    a. Head CT 6/15: small chronic lacunar infarct in thalamus  . Hyperlipidemia   . OA (osteoarthritis)   . Osteoporosis   . Peripheral neuropathy   . Pneumonia   . Pulmonary HTN ([place])    a. PASP on Echo in 9/12:  59 mmHg  . Renal insufficiency   . Sigmoid diverticulosis    s/p sigmoid  colectomy  . Stroke ([place])    TIAs  . Wears dentures   . Wears glasses    Family History  Problem Relation Age of Onset  . Osteoarthritis Mother   . Heart failure Mother   . Colon cancer Maternal Uncle 43  . Heart Problems Father   . Cancer Sister 26       ? colon cancer   . Kidney cancer Brother 32  . Esophageal cancer Neg Hx   . Rectal cancer Neg Hx   .  Stomach cancer Neg Hx   . Pancreatic cancer Neg Hx    Past Surgical History:  Procedure Laterality Date  . ABDOMINAL HYSTERECTOMY    . ANTERIOR CERVICAL DECOMP/DISCECTOMY FUSION  01-31-2009   C5 -- 7  . APPENDECTOMY    . Bone spurs Bilateral    Feet  . CARDIOVASCULAR STRESS TEST  02-26-2011   Normal lexiscan no exercise study/  no ischemia/  normal LV function and wall motion, ef 67%  . CARPAL TUNNEL RELEASE Right   . CATARACT EXTRACTION W/ INTRAOCULAR LENS  IMPLANT, BILATERAL Bilateral   . CHOLECYSTECTOMY    . COLOSTOMY N/A 06/02/2014   Procedure: DIVERTING DESCENDING END COLOSTOMY;  Surgeon: Donnie Mesa, MD;  Location: [place];  Service: General;  Laterality: N/A;  . CORONARY ANGIOPLASTY WITH STENT PLACEMENT  11-04-2007  dr bensimhon   BMS x2 to RCA/  mild Non-obstructive disease LAD/  normal LVF  . DILATION AND CURETTAGE OF UTERUS    . EVALUATION UNDER ANESTHESIA WITH ANAL FISTULECTOMY N/A 10/13/2014   Procedure: ANAL EXAM UNDER ANESTHESIA ;  Surgeon: [name], MD;  Location: [facility];  Service: General;  Laterality: N/A;  . HAND SURGERY     Tendon repair  . LAPAROSCOPIC SIGMOID COLECTOMY N/A 11/24/2014   Procedure: SIGMOID COLECTOMY AND COLSOTOMY CLOSURE;  Surgeon: Donnie Mesa, MD;  Location: [place];  Service: General;  Laterality: N/A;  . LUMBAR LAMINECTOMY  10-29-2002,  1969   Left  L3 -- 4  decompression  . PARTIAL COLECTOMY N/A 06/02/2014   Procedure: RIGHT PARTIAL COLECTOMY ;  Surgeon: Donnie Mesa, MD;  Location: [place];  Service: General;  Laterality: N/A;  . PATELLECTOMY Right 09/14/2013   Procedure:  PATELLECTOMY;  Surgeon: Meredith Pel, MD;  Location: [place];  Service: Orthopedics;  Laterality: Right;  . PROCTOSCOPY N/A 10/13/2014   Procedure: RIDGE PROCTOSCOPY;  Surgeon: [name], MD;  Location: [facility];  Service: General;  Laterality: N/A;  . REVISION TOTAL KNEE ARTHROPLASTY Bilateral right  04-02-2011/  left 1996 & 10-05-1999  . ROTATOR CUFF REPAIR Bilateral   . TONSILLECTOMY    . TOTAL KNEE ARTHROPLASTY Bilateral left 1994/  right 2000  . TOTAL KNEE REVISION Left 08/02/2015   Procedure: LEFT FEMORAL REVISION;  Surgeon: Gaynelle Arabian, MD;  Location: [facility];  Service: Orthopedics;  Laterality: Left;  . TRANSTHORACIC ECHOCARDIOGRAM  12-01-2009   Grade I diastolic dysfunction/  ef 60%/  moderate MR/  mild TR   Social History   Socioeconomic History  . Marital status: Widowed    Spouse name: Not on file  . Number of children: Not on file  . Years of education: Not on file  . Highest education level: Not on file  Social Needs  . Financial resource strain: Not on file  . Food insecurity - worry: Not on file  . Food insecurity - inability: Not on file  . Transportation needs - medical: Not on file  . Transportation needs - non-medical: Not on file  Occupational History  . Not on file  Tobacco Use  . Smoking status: Former Smoker    Packs/day: 0.50    Years: 15.00    Pack years: 7.50    Types: Cigarettes    Last attempt to quit: 07/16/1967    Years since quitting: 50.0  . Smokeless tobacco: Never Used  Substance and Sexual Activity  . Alcohol use: No    Alcohol/week: 0.0 oz  . Drug use: No  . Sexual activity: Not on  file  Other Topics Concern  . Not on file  Social History Narrative   Married, 2 children.  As of November 2015 her husband has been living in a nursing home for tendon after years as he suffers from multiple medical problems.   Patient does not drink alcohol nor does she smoke or chew tobacco products   Right handed   8th grade   1 cup daily      Review of Systems: General: negative for chills, fever, night sweats or weight changes.  Cardiovascular: negative for chest pain, dyspnea on exertion, edema, orthopnea, palpitations, paroxysmal nocturnal dyspnea or shortness of breath Dermatological: negative for rash Respiratory: negative for cough or wheezing Urologic: negative for hematuria Abdominal: negative for nausea, vomiting, diarrhea, bright red blood per rectum, melena, or hematemesis Neurologic: negative for visual changes, syncope, or dizziness All other systems reviewed and are otherwise negative except as noted above.   Physical Exam:  Blood pressure 130/84, pulse 77, resp. rate 16, height '5\' 3"'$  (1.6 m), weight 188 lb 1.9 oz (85.3 kg), SpO2 96 %.  General appearance: alert, cooperative and no distress Neck: no carotid bruit, no JVD, thyroid: normal to inspection and palpation and thyroid not enlarged, symmetric, no tenderness/mass/nodules Lungs: clear to auscultation bilaterally Heart: irregularly irregular rhythm and tachy rate Extremities: extremities normal, atraumatic, no cyanosis or edema Pulses: 2+ and symmetric Skin: Skin color, texture, turgor normal. No rashes or lesions Neurologic: Grossly normal  EKG Atrial fibrillation w/ CVR 77 bpm  -- personally reviewed   ASSESSMENT AND PLAN:   1. Acute on Chronic Diastolic HF: slight improvement in symptoms and LEE since increasing Lasix to daily use. LEE resolved but still with mild exertional dyspnea and orthopnea. Continue daily lasix. Will check f/u BMP today to monitor renal function and K. We will check 2D echo to insure no change in LVF.   2. CAD: h/o BMS to the RCA in 2009. Pt notes occasional exertional CP, relived with rest + exertional dyspnea. She is independent 84 year old. Lives alone and does all of her own cooking and cleaning, however has been limited recently due to CP, dyspnea and exertional fatigue. Afib is rate controlled. We will obtain NST and 2D  echo.   3. Chronic Atrial Fibrillation: HR is controlled with metoprolol and Cardizem. Eliquis for a/c. Given exertional dyspnea, fatigue, and pale color, we will check CBC to r/o anemia.   4. HTN: controlled on current regimen.   5. H/o Colon Cancer: s/p hemicolectomy.    Follow-Up in 3-4 weeks after w/u is complete.   Brittainy Ladoris Gene, MHS [facility] 08/04/2017 3:09 PM

## 2017-08-04 NOTE — Patient Instructions (Addendum)
Medication Instructions:  Your physician recommends that you continue on your current medications as directed. Please refer to the Current Medication list given to you today.   Labwork: TODAY: CBC, BMP  Testing/Procedures: Your physician has requested that you have an echocardiogram. Echocardiography is a painless test that uses sound waves to create images of your heart. It provides your doctor with information about the size and shape of your heart and how well your heart's chambers and valves are working. This procedure takes approximately one hour. There are no restrictions for this procedure.  Your physician has requested that you have a lexiscan myoview. For further information please visit [email or web address]. Please follow instruction sheet, as given.    Follow-Up: Your physician recommends that you schedule a follow-up appointment in [place] DR. Acie Fredrickson ON October 17, 2017 AT 3:00 PM   Any Other Special Instructions Will Be Listed Below (If Applicable).     If you need a refill on your cardiac medications before your next appointment, please call your pharmacy.

## 2017-08-05 LAB — BASIC METABOLIC PANEL
BUN/Creatinine Ratio: 27 (ref 12–28)
BUN: 34 mg/dL — ABNORMAL HIGH (ref 8–27)
CO2: 25 mmol/L (ref 20–29)
Calcium: 9.1 mg/dL (ref 8.7–10.3)
Chloride: 101 mmol/L (ref 96–106)
Creatinine, Ser: 1.28 mg/dL — ABNORMAL HIGH (ref 0.57–1.00)
GFR calc Af Amer: 44 mL/min/{1.73_m2} — ABNORMAL LOW (ref >59)
GFR calc non Af Amer: 38 mL/min/{1.73_m2} — ABNORMAL LOW (ref >59)
Glucose: 107 mg/dL — ABNORMAL HIGH (ref 65–99)
Potassium: 4.5 mmol/L (ref 3.5–5.2)
Sodium: 142 mmol/L (ref 134–144)

## 2017-08-05 LAB — CBC
Hematocrit: 37.4 % (ref 34.0–46.6)
Hemoglobin: 11.7 g/dL (ref 11.1–15.9)
MCH: 27.1 pg (ref 26.6–33.0)
MCHC: 31.3 g/dL — ABNORMAL LOW (ref 31.5–35.7)
MCV: 87 fL (ref 79–97)
Platelets: 184 10*3/uL (ref 150–379)
RBC: 4.32 x10E6/uL (ref 3.77–5.28)
RDW: 14.6 % (ref 12.3–15.4)
WBC: 7.5 10*3/uL (ref 3.4–10.8)

## 2017-08-06 ENCOUNTER — Telehealth: Payer: Self-pay | Admitting: *Deleted

## 2017-08-06 MED ORDER — FUROSEMIDE 20 MG PO TABS: 20.0000 mg | ORAL_TABLET | Freq: Every day | ORAL | 3 refills | Status: DC

## 2017-08-06 NOTE — Telephone Encounter (Signed)
Pt has been notified of lab results by phone with verbal understanding. Pt advised to to continue the lasix on a daily basis. Pt thanked me for my call.

## 2017-08-06 NOTE — Telephone Encounter (Signed)
-----   Message from Consuelo Pandy, [place] sent at 08/06/2017 10:38 AM EST ----- Labs look ok. Hgb is stable. No anemia. Kidney function is stable after change in lasix frequency to daily. K is also WNL. I recommend that she continue to take lasix daily to help with fluid/ CHF.

## 2017-08-12 ENCOUNTER — Telehealth (HOSPITAL_COMMUNITY): Payer: Self-pay | Admitting: *Deleted

## 2017-08-12 NOTE — Telephone Encounter (Signed)
Patient given detailed instructions per Myocardial Perfusion Study Information Sheet for the test on 08/15/17 at 0915. Patient notified to arrive 15 minutes early and that it is imperative to arrive on time for appointment to keep from having the test rescheduled.  If you need to cancel or reschedule your appointment, please call the office within 24 hours of your appointment. . Patient verbalized understanding.Arrington Yohe, [place]

## 2017-08-15 ENCOUNTER — Other Ambulatory Visit: Payer: Self-pay

## 2017-08-15 ENCOUNTER — Ambulatory Visit (HOSPITAL_COMMUNITY): Payer: PPO | Attending: Cardiology

## 2017-08-15 ENCOUNTER — Ambulatory Visit (HOSPITAL_BASED_OUTPATIENT_CLINIC_OR_DEPARTMENT_OTHER): Payer: PPO

## 2017-08-15 DIAGNOSIS — J449 Chronic obstructive pulmonary disease, unspecified: Secondary | ICD-10-CM | POA: Diagnosis not present

## 2017-08-15 DIAGNOSIS — I081 Rheumatic disorders of both mitral and tricuspid valves: Secondary | ICD-10-CM | POA: Diagnosis not present

## 2017-08-15 DIAGNOSIS — I2510 Atherosclerotic heart disease of native coronary artery without angina pectoris: Secondary | ICD-10-CM | POA: Insufficient documentation

## 2017-08-15 DIAGNOSIS — I272 Pulmonary hypertension, unspecified: Secondary | ICD-10-CM | POA: Diagnosis not present

## 2017-08-15 DIAGNOSIS — I110 Hypertensive heart disease with heart failure: Secondary | ICD-10-CM | POA: Diagnosis not present

## 2017-08-15 DIAGNOSIS — R0600 Dyspnea, unspecified: Secondary | ICD-10-CM

## 2017-08-15 DIAGNOSIS — E785 Hyperlipidemia, unspecified: Secondary | ICD-10-CM | POA: Diagnosis not present

## 2017-08-15 DIAGNOSIS — I5032 Chronic diastolic (congestive) heart failure: Secondary | ICD-10-CM | POA: Insufficient documentation

## 2017-08-15 DIAGNOSIS — I4891 Unspecified atrial fibrillation: Secondary | ICD-10-CM | POA: Diagnosis not present

## 2017-08-15 DIAGNOSIS — R079 Chest pain, unspecified: Secondary | ICD-10-CM

## 2017-08-15 DIAGNOSIS — R9439 Abnormal result of other cardiovascular function study: Secondary | ICD-10-CM | POA: Diagnosis not present

## 2017-08-15 DIAGNOSIS — R5383 Other fatigue: Secondary | ICD-10-CM | POA: Diagnosis not present

## 2017-08-15 DIAGNOSIS — Z8673 Personal history of transient ischemic attack (TIA), and cerebral infarction without residual deficits: Secondary | ICD-10-CM | POA: Diagnosis not present

## 2017-08-15 LAB — MYOCARDIAL PERFUSION IMAGING
LV dias vol: 81 mL (ref 46–106)
LV sys vol: 39 mL
Peak HR: 83 {beats}/min
RATE: 0.35
Rest HR: 65 {beats}/min
SDS: 6
SRS: 2
SSS: 8
TID: 0.99

## 2017-08-15 MED ORDER — TECHNETIUM TC 99M TETROFOSMIN IV KIT
32.1000 | PACK | Freq: Once | INTRAVENOUS | Status: AC | PRN
  Administered 2017-08-15: 32.1 via INTRAVENOUS
  Filled 2017-08-15: qty 33

## 2017-08-15 MED ORDER — REGADENOSON 0.4 MG/5ML IV SOLN
0.4000 mg | Freq: Once | INTRAVENOUS | Status: AC
  Administered 2017-08-15: 0.4 mg via INTRAVENOUS

## 2017-08-15 MED ORDER — TECHNETIUM TC 99M TETROFOSMIN IV KIT
10.5000 | PACK | Freq: Once | INTRAVENOUS | Status: AC | PRN
  Administered 2017-08-15: 10.5 via INTRAVENOUS
  Filled 2017-08-15: qty 11

## 2017-08-20 ENCOUNTER — Other Ambulatory Visit: Payer: PPO

## 2017-08-25 ENCOUNTER — Telehealth: Payer: Self-pay | Admitting: Hematology

## 2017-08-25 NOTE — Telephone Encounter (Signed)
Patient called to cancel °

## 2017-08-27 ENCOUNTER — Ambulatory Visit: Payer: PPO | Admitting: Hematology

## 2017-09-02 ENCOUNTER — Ambulatory Visit (INDEPENDENT_AMBULATORY_CARE_PROVIDER_SITE_OTHER): Payer: PPO | Admitting: Internal Medicine

## 2017-09-02 ENCOUNTER — Encounter: Payer: Self-pay | Admitting: Internal Medicine

## 2017-09-02 VITALS — BP 122/72 | HR 75 | Temp 98.1°F | Ht 63.0 in | Wt 191.0 lb

## 2017-09-02 DIAGNOSIS — I10 Essential (primary) hypertension: Secondary | ICD-10-CM

## 2017-09-02 DIAGNOSIS — I482 Chronic atrial fibrillation, unspecified: Secondary | ICD-10-CM

## 2017-09-02 DIAGNOSIS — I5032 Chronic diastolic (congestive) heart failure: Secondary | ICD-10-CM | POA: Diagnosis not present

## 2017-09-02 NOTE — Patient Instructions (Signed)
Limit your sodium (Salt) intake  Cardiology follow-up as scheduled  Report any new or worsening symptoms  Return in 3 months for follow-up

## 2017-09-02 NOTE — Progress Notes (Signed)
Subjective:    Patient ID: [name], female    DOB: 01/03/1930, 84 y.o.   MRN: [id number]  HPI  84 year old patient who is seen today accompanied by her daughter-in-law from [place];  she has a history of hypertension as well as chronic diastolic heart failure.  She is followed by cardiology with history of chronic atrial fibrillation and remains on Eliquis anticoagulation.  She is having some issues with cost and is considering a switch back to Coumadin. Her main complaint is dry nonproductive cough.  She states this has been an issue since an episode of community-acquired pneumonia in the fall of last year; she continues to have orthopnea and sleeps in a recliner.  Wt Readings from Last 3 Encounters:  09/02/17 191 lb (86.6 kg)  08/04/17 188 lb 1.9 oz (85.3 kg)  08/04/17 188 lb 3.2 oz (85.4 kg)    Past Medical History:  Diagnosis Date  . Adenocarcinoma, colon [facility]) Oncologist-- Dr Truitt Merle   Multifocal (2) Colon cancer @ ileocecal valve and ascending ( [id number]), Stage IIIB, grade I, MMR normal , 2 of 16 +lymph nodes, negative surgical margins---  06-02-2014  Right hemicolectomy w/ colostomy  . Allergy   . Anxiety   . Asthma    inhaler "sometimes"  . CAD (coronary artery disease)    a. LHC 4/09: pLAD 40, mDx 70-75, pLCx 30, mLCx 50, mRCA 90, EF 60% >> PCI: BMS x2 to RCA;  b. Myoview 8/12: normal  . Cataract    bil cateracts removed  . Chronic anemia   . Chronic diastolic CHF (congestive heart failure) (HCC)    a. Echo 912 - Mild LVH, EF 55-60%, no RWMA, Gr 1 DD, mild MR, mild LAE, mild RAE, PASP 59 mmHg (mod to severe pulmo HTN)  . Clotting disorder (HCC)    hx of dvt, tia on eliquis  . Colostomy in place [facility])    s/p colostomy takedown 5/16  . Colovaginal fistula    s/p colostomy >> colostomy takedown 5/16  . COPD with chronic bronchitis ([place])   . Depression   . Dysrhythmia    A fib  . Essential hypertension   . GERD (gastroesophageal reflux disease)     . History of adenomatous polyp of colon   . History of blood transfusion   . History of DVT of lower extremity   . History of hiatal hernia   . History of TIA (transient ischemic attack)    a. Head CT 6/15: small chronic lacunar infarct in thalamus  . Hyperlipidemia   . OA (osteoarthritis)   . Osteoporosis   . Peripheral neuropathy   . Pneumonia   . Pulmonary HTN ([place])    a. PASP on Echo in 9/12:  59 mmHg  . Renal insufficiency   . Sigmoid diverticulosis    s/p sigmoid colectomy  . Stroke ([place])    TIAs  . Wears dentures   . Wears glasses      Social History   Socioeconomic History  . Marital status: Widowed    Spouse name: Not on file  . Number of children: Not on file  . Years of education: Not on file  . Highest education level: Not on file  Social Needs  . Financial resource strain: Not on file  . Food insecurity - worry: Not on file  . Food insecurity - inability: Not on file  . Transportation needs - medical: Not on file  . Transportation needs - non-medical: Not  on file  Occupational History  . Not on file  Tobacco Use  . Smoking status: Former Smoker    Packs/day: 0.50    Years: 15.00    Pack years: 7.50    Types: Cigarettes    Last attempt to quit: 07/16/1967    Years since quitting: 50.1  . Smokeless tobacco: Never Used  Substance and Sexual Activity  . Alcohol use: No    Alcohol/week: 0.0 oz  . Drug use: No  . Sexual activity: Not on file  Other Topics Concern  . Not on file  Social History Narrative   Married, 2 children.  As of November 2015 her husband has been living in a nursing home for tendon after years as he suffers from multiple medical problems.   Patient does not drink alcohol nor does she smoke or chew tobacco products   Right handed   8th grade   1 cup daily    Past Surgical History:  Procedure Laterality Date  . ABDOMINAL HYSTERECTOMY    . ANTERIOR CERVICAL DECOMP/DISCECTOMY FUSION  01-31-2009   C5 -- 7  . APPENDECTOMY    .  Bone spurs Bilateral    Feet  . CARDIOVASCULAR STRESS TEST  02-26-2011   Normal lexiscan no exercise study/  no ischemia/  normal LV function and wall motion, ef 67%  . CARPAL TUNNEL RELEASE Right   . CATARACT EXTRACTION W/ INTRAOCULAR LENS  IMPLANT, BILATERAL Bilateral   . CHOLECYSTECTOMY    . COLOSTOMY N/A 06/02/2014   Procedure: DIVERTING DESCENDING END COLOSTOMY;  Surgeon: Donnie Mesa, MD;  Location: [place];  Service: General;  Laterality: N/A;  . CORONARY ANGIOPLASTY WITH STENT PLACEMENT  11-04-2007  dr bensimhon   BMS x2 to RCA/  mild Non-obstructive disease LAD/  normal LVF  . DILATION AND CURETTAGE OF UTERUS    . EVALUATION UNDER ANESTHESIA WITH ANAL FISTULECTOMY N/A 10/13/2014   Procedure: ANAL EXAM UNDER ANESTHESIA ;  Surgeon: [name], MD;  Location: [facility];  Service: General;  Laterality: N/A;  . HAND SURGERY     Tendon repair  . LAPAROSCOPIC SIGMOID COLECTOMY N/A 11/24/2014   Procedure: SIGMOID COLECTOMY AND COLSOTOMY CLOSURE;  Surgeon: Donnie Mesa, MD;  Location: [place];  Service: General;  Laterality: N/A;  . LUMBAR LAMINECTOMY  10-29-2002,  1969   Left  L3 -- 4  decompression  . PARTIAL COLECTOMY N/A 06/02/2014   Procedure: RIGHT PARTIAL COLECTOMY ;  Surgeon: Donnie Mesa, MD;  Location: [place];  Service: General;  Laterality: N/A;  . PATELLECTOMY Right 09/14/2013   Procedure: PATELLECTOMY;  Surgeon: Meredith Pel, MD;  Location: [place];  Service: Orthopedics;  Laterality: Right;  . PROCTOSCOPY N/A 10/13/2014   Procedure: RIDGE PROCTOSCOPY;  Surgeon: [name], MD;  Location: [facility];  Service: General;  Laterality: N/A;  . REVISION TOTAL KNEE ARTHROPLASTY Bilateral right  04-02-2011/  left 1996 & 10-05-1999  . ROTATOR CUFF REPAIR Bilateral   . TONSILLECTOMY    . TOTAL KNEE ARTHROPLASTY Bilateral left 1994/  right 2000  . TOTAL KNEE REVISION Left 08/02/2015   Procedure: LEFT FEMORAL REVISION;  Surgeon: Gaynelle Arabian, MD;  Location: [facility];  Service: Orthopedics;   Laterality: Left;  . TRANSTHORACIC ECHOCARDIOGRAM  12-01-2009   Grade I diastolic dysfunction/  ef 60%/  moderate MR/  mild TR    Family History  Problem Relation Age of Onset  . Osteoarthritis Mother   . Heart failure Mother   . Colon cancer Maternal Uncle  59  . Heart Problems Father   . Cancer Sister 23       ? colon cancer   . Kidney cancer Brother 13  . Esophageal cancer Neg Hx   . Rectal cancer Neg Hx   . Stomach cancer Neg Hx   . Pancreatic cancer Neg Hx     No Known Allergies  Current Outpatient Medications on File Prior to Visit  Medication Sig Dispense Refill  . acetaminophen (TYLENOL) 325 MG tablet Take 650 mg by mouth every 6 (six) hours as needed for moderate pain.     Marland Kitchen apixaban (ELIQUIS) 5 MG TABS tablet Take 1 tablet (5 mg total) by mouth 2 (two) times daily. 180 tablet 3  . Ascorbic Acid (VITAMIN C) 1000 MG tablet Take 1,000 mg by mouth daily.    Marland Kitchen diltiazem (CARDIZEM) 30 MG tablet Take 1 tablet (30 mg total) by mouth every 8 (eight) hours. 90 tablet 6  . esomeprazole (NEXIUM) 20 MG capsule Take 1 capsule (20 mg total) by mouth daily at 12 noon. (Patient taking differently: Take 20 mg by mouth daily. ) 90 capsule 2  . ferrous sulfate 325 (65 FE) MG tablet Take 1 tablet (325 mg total) by mouth 2 (two) times daily with a meal. (Patient taking differently: Take 325 mg by mouth daily with breakfast. )  3  . furosemide (LASIX) 20 MG tablet Take 1 tablet (20 mg total) by mouth daily. 90 tablet 3  . gabapentin (NEURONTIN) 600 MG tablet Take 1 tablet (600 mg total) by mouth 3 (three) times daily. (Patient taking differently: Take 600 mg by mouth 2 (two) times daily. ) 90 tablet 3  . HYDROcodone-acetaminophen (NORCO/VICODIN) 5-325 MG tablet Take 1-2 tablets by mouth every 6 (six) hours as needed for moderate pain. 40 tablet 0  . LORazepam (ATIVAN) 0.5 MG tablet TAKE 1 TABLET BY MOUTH TWICE DAILY AS NEEDED FOR ANXIETY OR  SLEEP 60 tablet 0  . metoprolol (LOPRESSOR) 100 MG  tablet Take 1 tablet (100 mg total) by mouth 2 (two) times daily. 180 tablet 3  . nitroGLYCERIN (NITROSTAT) 0.4 MG SL tablet Place 1 tablet (0.4 mg total) under the tongue every 5 (five) minutes as needed for chest pain (x 3 pills). Reported on 09/01/2015 25 tablet 3  . promethazine (PHENERGAN) 12.5 MG tablet TAKE 1 TABLET (12.5 MG TOTAL) BY MOUTH EVERY 8 HOURS AS NEEDED FOR NAUSEA OR VOMITING. (Patient taking differently: Take 12.5 mg by mouth every 8 (eight) hours as needed for nausea or vomiting. ) 20 tablet 1  . sertraline (ZOLOFT) 50 MG tablet Take 1 tablet (50 mg total) by mouth daily. 90 tablet 3  . traZODone (DESYREL) 100 MG tablet Take 0.5 tablets (50 mg total) by mouth at bedtime. 30 tablet 5  . triamcinolone cream (KENALOG) 0.1 % Apply 1 application topically 2 (two) times daily as needed (Eczema on legs). 30 g 2   Current Facility-Administered Medications on File Prior to Visit  Medication Dose Route Frequency Provider Last Rate Last Dose  . 0.9 %  sodium chloride infusion  500 mL Intravenous Continuous [name], MD        BP 122/72 (BP Location: Left Arm, Patient Position: Sitting)   Pulse 75   Temp 98.1 F (36.7 C) (Oral)   Ht _0  (1.6 m)   Wt 191 lb (86.6 kg)   SpO2 94%   BMI 33.83 kg/m     Review of Systems  Constitutional: Negative.  HENT: Negative for congestion, dental problem, hearing loss, rhinorrhea, sinus pressure, sore throat and tinnitus.   Eyes: Negative for pain, discharge and visual disturbance.  Respiratory: Positive for cough and shortness of breath.   Cardiovascular: Negative for chest pain, palpitations and leg swelling.  Gastrointestinal: Negative for abdominal distention, abdominal pain, blood in stool, constipation, diarrhea, nausea and vomiting.  Genitourinary: Negative for difficulty urinating, dysuria, flank pain, frequency, hematuria, pelvic pain, urgency, vaginal bleeding, vaginal discharge and vaginal pain.  Musculoskeletal: Negative for  arthralgias, gait problem and joint swelling.  Skin: Negative for rash.  Neurological: Negative for dizziness, syncope, speech difficulty, weakness, numbness and headaches.  Hematological: Negative for adenopathy.  Psychiatric/Behavioral: Negative for agitation, behavioral problems and dysphoric mood. The patient is not nervous/anxious.        Objective:   Physical Exam  Constitutional: She is oriented to person, place, and time. She appears well-developed and well-nourished.  Blood pressure 122/70 Weight 191 No distress   HENT:  Head: Normocephalic.  Right Ear: External ear normal.  Left Ear: External ear normal.  Mouth/Throat: Oropharynx is clear and moist.  Eyes: Conjunctivae and EOM are normal. Pupils are equal, round, and reactive to light.  Neck: Normal range of motion. Neck supple. No JVD present. No thyromegaly present.  Cardiovascular: Normal rate, regular rhythm, normal heart sounds and intact distal pulses.  Pulmonary/Chest: Effort normal and breath sounds normal.  Few crackles right base only  Abdominal: Soft. Bowel sounds are normal. She exhibits no mass. There is no tenderness.  Musculoskeletal: Normal range of motion.  Trace edema  Lymphadenopathy:    She has no cervical adenopathy.  Neurological: She is alert and oriented to person, place, and time.  Skin: Skin is warm and dry. No rash noted.  Psychiatric: She has a normal mood and affect. Her behavior is normal.          Assessment & Plan:   Essential hypertension stable Chronic diastolic heart failure.  Follow-up cardiology tomorrow Chronic atrial fibrillation.  Patient plans to discuss with cardiology if possible switch to Coumadin History of colon cancer Chronic anticoagulation  Return here in 3 months or as needed  [place]

## 2017-09-03 ENCOUNTER — Encounter: Payer: Self-pay | Admitting: Cardiology

## 2017-09-03 ENCOUNTER — Ambulatory Visit: Payer: PPO | Admitting: Cardiology

## 2017-09-03 VITALS — BP 122/72 | HR 70 | Ht 63.0 in | Wt 189.8 lb

## 2017-09-03 DIAGNOSIS — I482 Chronic atrial fibrillation, unspecified: Secondary | ICD-10-CM

## 2017-09-03 DIAGNOSIS — I2510 Atherosclerotic heart disease of native coronary artery without angina pectoris: Secondary | ICD-10-CM

## 2017-09-03 DIAGNOSIS — I5032 Chronic diastolic (congestive) heart failure: Secondary | ICD-10-CM

## 2017-09-03 LAB — BASIC METABOLIC PANEL
BUN/Creatinine Ratio: 28 (ref 12–28)
BUN: 40 mg/dL — ABNORMAL HIGH (ref 8–27)
CO2: 25 mmol/L (ref 20–29)
Calcium: 9.1 mg/dL (ref 8.7–10.3)
Chloride: 101 mmol/L (ref 96–106)
Creatinine, Ser: 1.45 mg/dL — ABNORMAL HIGH (ref 0.57–1.00)
GFR calc Af Amer: 38 mL/min/{1.73_m2} — ABNORMAL LOW (ref >59)
GFR calc non Af Amer: 33 mL/min/{1.73_m2} — ABNORMAL LOW (ref >59)
Glucose: 92 mg/dL (ref 65–99)
Potassium: 4.8 mmol/L (ref 3.5–5.2)
Sodium: 142 mmol/L (ref 134–144)

## 2017-09-03 NOTE — Patient Instructions (Signed)
Medication Instructions:  Your physician recommends that you continue on your current medications as directed. Please refer to the Current Medication list given to you today.   Labwork: TODAY - basic metabolic panel   Testing/Procedures: None Ordered   Follow-Up: Your physician recommends that you keep your follow-up appointment in: April with Dr. Acie Fredrickson   If you need a refill on your cardiac medications before your next appointment, please call your pharmacy.   Thank you for choosing [facility]! Christen Bame, RN [phone]

## 2017-09-03 NOTE — Progress Notes (Signed)
09/03/2017 [name]   12-08-1929  [id number]  Primary Physician Marletta Lor, MD Primary Cardiologist: Mertie Moores, MD  Reason for Visit/CC: f/u for dyspnea and acute on chronic diastolic CHF  HPI:  [name] Mayis a 84 y.o.femalewith history of CAD s/p BMS to RCA x 2 in 6195, diastolic CHF, HTN, HLD, persistent afib on Eliquis, chronic dyspnea on exertion, COPD, pulmonary HTN, ? prior TIA, prior DVT, colon CA who presents for f/u for dyspnea and recent acute on chronic diastolic CHF.   Pt was seen at a local urgent care first part of January for examination given recent symptoms. Pt noted exertional dyspnea, LEE and orthopnea, having to sleep in recliner.   Pt was felt to be volume overloaded and was given instruction to increase Lasix from 3 days a week to daily. After her urgent care visit, she had f/u with her PCP,  Dr. Bluford Kaufmann. At that visit, pt had improvement in symptoms but still complained of mild exertional dyspnea and still with orthopnea. LEE had resolved.  BNP was checked in PCP office and was 366. She had also endorsed notable fatigue +  exertional chest tightness from time to time that improved with rest. No resting symptoms. No use of SL NTG. She is very independent. She lives home alone and does all of her cooking and cleaning herself.   I saw her in f/u on 08/04/17 and given persistent dyspnea and CHF exacerbation, I ordered a repeat echo to assess for changes in LVF and to r/o worsening mitral regurgitation. Echo showed normal LVEF at 55-60% with normal wall motion and stable moderate MR. This was also reviewed by Dr. [name] who felt that her MR was stable and not severe enough to warrant referral to valve clinic for mitral valve clip. Given her CAD history, chest tightness, fatigue and exertional dyspnea, she was also ordered to undergo nuclear stress testing to r/o ischemia. Stress test 08/15/17 showed a small region of moderate ischemia in the distal  anterior, anterolateral, septal and apical walls. Cannot exclude some shifting breast attenuation. As outlined above, echo showed no WMAs. Also of note, a CBC was obtained to r/o anemia and was normal. Hgb was 11.7. Pt was instructed to continue lasix daily.   Pt presents back to clinic today for f/u. She is here with her daughter who drove up from [place], [place]. Pt notes that she is feeling better. Breathing improved. LEE also improved. She continues to take lasix daily but did not take this morning given office visit. She plans to take dose when she returns home. She continues to have occasional chest discomfort with moderate activities. She uses nitro and also uses her inhalers when she develops symptoms. She has relief with both nitro and inhales, but believes she gets better relief after using her inhalers. She denies any resting pain and no prolonged pain. She is currently asymptomatic. BP is 122/72. Afib is rate controlled, 70 bpm.   Overall, pt appears to be feeling better today compared to previous office visit. Seems more upbeat. We spent a lot of time talking about her granddaughter who is in her second year of PA school in [place]. She is very proud of her.    Cardiac Studies   2D Echo 08/15/17 Study Conclusions  - Left ventricle: The cavity size was normal. There was mild focal   basal hypertrophy of the septum. Systolic function was normal.   The estimated ejection fraction was in the  range of 55% to 60%.   Wall motion was normal; there were no regional wall motion   abnormalities. The study is not technically sufficient to allow   evaluation of LV diastolic function. - Aortic valve: Trileaflet; mildly thickened, mildly calcified   leaflets. - Mitral valve: There was moderate regurgitation. - Left atrium: The atrium was severely dilated. - Right atrium: The atrium was severely dilated. - Tricuspid valve: There was moderate regurgitation. - Pulmonary arteries:  Systolic pressure was moderately increased.   PA peak pressure: 54 mm Hg (S).  Lexiscan NST 08/15/17 Study Highlights     Nuclear stress EF: 52%.  Small region of moderate ischemia in the distal anterior, anterolateral, septal and apical walls. Cannot exclude some shifting breast attenuation.  There was no ST segment deviation noted during stress.  This is an intermediate risk study.       Current Meds  Medication Sig  . acetaminophen (TYLENOL) 325 MG tablet Take 650 mg by mouth every 6 (six) hours as needed for moderate pain.   Marland Kitchen apixaban (ELIQUIS) 5 MG TABS tablet Take 1 tablet (5 mg total) by mouth 2 (two) times daily.  . Ascorbic Acid (VITAMIN C) 1000 MG tablet Take 1,000 mg by mouth daily.  Marland Kitchen diltiazem (CARDIZEM) 30 MG tablet Take 1 tablet (30 mg total) by mouth every 8 (eight) hours.  Marland Kitchen esomeprazole (NEXIUM) 20 MG capsule Take 1 capsule (20 mg total) by mouth daily at 12 noon. (Patient taking differently: Take 20 mg by mouth daily. )  . ferrous sulfate 325 (65 FE) MG EC tablet Take 325 mg by mouth daily with breakfast.  . furosemide (LASIX) 20 MG tablet Take 1 tablet (20 mg total) by mouth daily.  Marland Kitchen gabapentin (NEURONTIN) 600 MG tablet Take 600 mg by mouth 2 (two) times daily.  Marland Kitchen HYDROcodone-acetaminophen (NORCO/VICODIN) 5-325 MG tablet Take 1-2 tablets by mouth every 6 (six) hours as needed for moderate pain.  Marland Kitchen LORazepam (ATIVAN) 0.5 MG tablet TAKE 1 TABLET BY MOUTH TWICE DAILY AS NEEDED FOR ANXIETY OR  SLEEP  . metoprolol (LOPRESSOR) 100 MG tablet Take 1 tablet (100 mg total) by mouth 2 (two) times daily.  . nitroGLYCERIN (NITROSTAT) 0.4 MG SL tablet Place 1 tablet (0.4 mg total) under the tongue every 5 (five) minutes as needed for chest pain (x 3 pills). Reported on 09/01/2015  . promethazine (PHENERGAN) 12.5 MG tablet TAKE 1 TABLET (12.5 MG TOTAL) BY MOUTH EVERY 8 HOURS AS NEEDED FOR NAUSEA OR VOMITING. (Patient taking differently: Take 12.5 mg by mouth every 8 (eight) hours  as needed for nausea or vomiting. )  . sertraline (ZOLOFT) 50 MG tablet Take 1 tablet (50 mg total) by mouth daily.  . traZODone (DESYREL) 100 MG tablet Take 0.5 tablets (50 mg total) by mouth at bedtime.  . triamcinolone cream (KENALOG) 0.1 % Apply 1 application topically 2 (two) times daily as needed (Eczema on legs).   Current Facility-Administered Medications for the 09/03/17 encounter (Office Visit) with Consuelo Pandy, PA-C  Medication  . 0.9 %  sodium chloride infusion   No Known Allergies Past Medical History:  Diagnosis Date  . Adenocarcinoma, colon [facility]) Oncologist-- Dr Truitt Merle   Multifocal (2) Colon cancer @ ileocecal valve and ascending ( [id number]), Stage IIIB, grade I, MMR normal , 2 of 16 +lymph nodes, negative surgical margins---  06-02-2014  Right hemicolectomy w/ colostomy  . Allergy   . Anxiety   . Asthma    inhaler "sometimes"  .  CAD (coronary artery disease)    a. LHC 4/09: pLAD 40, mDx 70-75, pLCx 30, mLCx 50, mRCA 90, EF 60% >> PCI: BMS x2 to RCA;  b. Myoview 8/12: normal  . Cataract    bil cateracts removed  . Chronic anemia   . Chronic diastolic CHF (congestive heart failure) (HCC)    a. Echo 912 - Mild LVH, EF 55-60%, no RWMA, Gr 1 DD, mild MR, mild LAE, mild RAE, PASP 59 mmHg (mod to severe pulmo HTN)  . Clotting disorder (HCC)    hx of dvt, tia on eliquis  . Colostomy in place [facility])    s/p colostomy takedown 5/16  . Colovaginal fistula    s/p colostomy >> colostomy takedown 5/16  . COPD with chronic bronchitis ([place])   . Depression   . Dysrhythmia    A fib  . Essential hypertension   . GERD (gastroesophageal reflux disease)   . History of adenomatous polyp of colon   . History of blood transfusion   . History of DVT of lower extremity   . History of hiatal hernia   . History of TIA (transient ischemic attack)    a. Head CT 6/15: small chronic lacunar infarct in thalamus  . Hyperlipidemia   . OA (osteoarthritis)   . Osteoporosis   .  Peripheral neuropathy   . Pneumonia   . Pulmonary HTN ([place])    a. PASP on Echo in 9/12:  59 mmHg  . Renal insufficiency   . Sigmoid diverticulosis    s/p sigmoid colectomy  . Stroke ([place])    TIAs  . Wears dentures   . Wears glasses    Family History  Problem Relation Age of Onset  . Osteoarthritis Mother   . Heart failure Mother   . Colon cancer Maternal Uncle 4  . Heart Problems Father   . Cancer Sister 46       ? colon cancer   . Kidney cancer Brother 60  . Esophageal cancer Neg Hx   . Rectal cancer Neg Hx   . Stomach cancer Neg Hx   . Pancreatic cancer Neg Hx    Past Surgical History:  Procedure Laterality Date  . ABDOMINAL HYSTERECTOMY    . ANTERIOR CERVICAL DECOMP/DISCECTOMY FUSION  01-31-2009   C5 -- 7  . APPENDECTOMY    . Bone spurs Bilateral    Feet  . CARDIOVASCULAR STRESS TEST  02-26-2011   Normal lexiscan no exercise study/  no ischemia/  normal LV function and wall motion, ef 67%  . CARPAL TUNNEL RELEASE Right   . CATARACT EXTRACTION W/ INTRAOCULAR LENS  IMPLANT, BILATERAL Bilateral   . CHOLECYSTECTOMY    . COLOSTOMY N/A 06/02/2014   Procedure: DIVERTING DESCENDING END COLOSTOMY;  Surgeon: Donnie Mesa, MD;  Location: [place];  Service: General;  Laterality: N/A;  . CORONARY ANGIOPLASTY WITH STENT PLACEMENT  11-04-2007  dr bensimhon   BMS x2 to RCA/  mild Non-obstructive disease LAD/  normal LVF  . DILATION AND CURETTAGE OF UTERUS    . EVALUATION UNDER ANESTHESIA WITH ANAL FISTULECTOMY N/A 10/13/2014   Procedure: ANAL EXAM UNDER ANESTHESIA ;  Surgeon: [name], MD;  Location: [facility];  Service: General;  Laterality: N/A;  . HAND SURGERY     Tendon repair  . LAPAROSCOPIC SIGMOID COLECTOMY N/A 11/24/2014   Procedure: SIGMOID COLECTOMY AND COLSOTOMY CLOSURE;  Surgeon: Donnie Mesa, MD;  Location: [place];  Service: General;  Laterality: N/A;  . LUMBAR LAMINECTOMY  10-29-2002,  1969   Left  L3 -- 4  decompression  . PARTIAL COLECTOMY N/A 06/02/2014    Procedure: RIGHT PARTIAL COLECTOMY ;  Surgeon: Donnie Mesa, MD;  Location: [place];  Service: General;  Laterality: N/A;  . PATELLECTOMY Right 09/14/2013   Procedure: PATELLECTOMY;  Surgeon: Meredith Pel, MD;  Location: [place];  Service: Orthopedics;  Laterality: Right;  . PROCTOSCOPY N/A 10/13/2014   Procedure: RIDGE PROCTOSCOPY;  Surgeon: [name], MD;  Location: [facility];  Service: General;  Laterality: N/A;  . REVISION TOTAL KNEE ARTHROPLASTY Bilateral right  04-02-2011/  left 1996 & 10-05-1999  . ROTATOR CUFF REPAIR Bilateral   . TONSILLECTOMY    . TOTAL KNEE ARTHROPLASTY Bilateral left 1994/  right 2000  . TOTAL KNEE REVISION Left 08/02/2015   Procedure: LEFT FEMORAL REVISION;  Surgeon: Gaynelle Arabian, MD;  Location: [facility];  Service: Orthopedics;  Laterality: Left;  . TRANSTHORACIC ECHOCARDIOGRAM  12-01-2009   Grade I diastolic dysfunction/  ef 60%/  moderate MR/  mild TR   Social History   Socioeconomic History  . Marital status: Widowed    Spouse name: Not on file  . Number of children: Not on file  . Years of education: Not on file  . Highest education level: Not on file  Social Needs  . Financial resource strain: Not on file  . Food insecurity - worry: Not on file  . Food insecurity - inability: Not on file  . Transportation needs - medical: Not on file  . Transportation needs - non-medical: Not on file  Occupational History  . Not on file  Tobacco Use  . Smoking status: Former Smoker    Packs/day: 0.50    Years: 15.00    Pack years: 7.50    Types: Cigarettes    Last attempt to quit: 07/16/1967    Years since quitting: 50.1  . Smokeless tobacco: Never Used  Substance and Sexual Activity  . Alcohol use: No    Alcohol/week: 0.0 oz  . Drug use: No  . Sexual activity: Not on file  Other Topics Concern  . Not on file  Social History Narrative   Married, 2 children.  As of November 2015 her husband has been living in a nursing home for tendon after years as he  suffers from multiple medical problems.   Patient does not drink alcohol nor does she smoke or chew tobacco products   Right handed   8th grade   1 cup daily     Review of Systems: General: negative for chills, fever, night sweats or weight changes.  Cardiovascular: negative for chest pain, dyspnea on exertion, edema, orthopnea, palpitations, paroxysmal nocturnal dyspnea or shortness of breath Dermatological: negative for rash Respiratory: negative for cough or wheezing Urologic: negative for hematuria Abdominal: negative for nausea, vomiting, diarrhea, bright red blood per rectum, melena, or hematemesis Neurologic: negative for visual changes, syncope, or dizziness All other systems reviewed and are otherwise negative except as noted above.   Physical Exam:  Blood pressure 122/72, pulse 70, height '5\' 3"'$  (1.6 m), weight 189 lb 12.8 oz (86.1 kg), SpO2 95 %.  General appearance: alert, cooperative and no distress Neck: no carotid bruit and no JVD Lungs: clear to auscultation bilaterally Heart: irregularly irregular rhythm and regular rate Extremities: extremities normal, atraumatic, no cyanosis or edema Pulses: 2+ and symmetric Skin: Skin color, texture, turgor normal. No rashes or lesions Neurologic: Grossly normal  EKG not performed -- personally reviewed   ASSESSMENT AND PLAN:  1. Chronic Diastolic CHF: recent echo showed normal LVEF at 55-60%. Volume is stable. No LEE. Lungs are CTAB. Breathing improved. BP stable. Continue daily lasix. We will obtain f/u BMP today to check renal function and K.   2. CAD: h/o BMS to the RCA in 2009. Recent nuclear stress test 08/15/17 showed Small region of moderate ischemia in the distal anterior, anterolateral, septal and apical walls. 2D echo with no WMA and normal EF. Shifting breast attenuation could not be ruled out on imaging.  She continues to have occasional chest discomfort with moderate activities. She uses nitro and also uses her  inhalers when she develops symptoms. She has relief with both nitro and inhales, but believes she gets better relief after using her inhalers. She denies any resting pain and no prolonged pain. Will continue medical therapy for now. Her BP this morning is 122/72, before lasix. Thus, I don't think there is room to add Imdur at this time. Pt will notify our office if her symptoms worsen/ increase in frequency. She has close f/u with Dr. Acie Fredrickson in April and can reassess symptoms.   3. Permanent Atrial Fibrillation: rate is controlled with metoprolol and Cardizem. Continue Eliquis for a/c. Recent CBC showed stable H/H. Pt concerned about cost of Eliquis. We discussed options including other DOACs vs Coumadin. We will have Jeani Hawking in our office compare cost of other DOACs to see if there are any more affordable options. Pt has been on Coumadin before and would like to avoid going back due to inconvenience of frequent INR checks and medication/ food interactions. She was given samples of Eliquis today and will further consider other options and discuss with Dr. Acie Fredrickson in April.   4. Mitral Regurgitation: moderate by recent echo. Echo was also reviewed by Dr. Acie Fredrickson and felt to only be 2+. No indication for MVR/ mitral valve clip at this time.   5. HTN: controlled on current regimen.   6. H/o Colon Cancer: s/p hemicolectomy.    Follow-Up: keep yearly f/u with Dr Acie Fredrickson in April.   [name], MHS [facility] 09/03/2017 10:25 AM

## 2017-09-04 NOTE — Progress Notes (Signed)
Pt has been made aware of normal result and verbalized understanding.  jw 09/04/17

## 2017-09-12 ENCOUNTER — Telehealth: Payer: Self-pay | Admitting: Family Medicine

## 2017-09-12 DIAGNOSIS — M159 Polyosteoarthritis, unspecified: Secondary | ICD-10-CM

## 2017-09-12 DIAGNOSIS — M150 Primary generalized (osteo)arthritis: Principal | ICD-10-CM

## 2017-09-12 DIAGNOSIS — M8949 Other hypertrophic osteoarthropathy, multiple sites: Secondary | ICD-10-CM

## 2017-09-12 NOTE — Telephone Encounter (Signed)
Vicente Males is returning call about pt bone density scan please call her at [phone]

## 2017-09-12 NOTE — Telephone Encounter (Signed)
Left message for [name] to return phone call.

## 2017-09-12 NOTE — Telephone Encounter (Signed)
Copied from [place] [id number]. Topic: Referral - Request >> Sep 12, 2017 10:25 AM Hewitt Shorts wrote: Reason for CRM: [name] with [employer] is calling to see if the provider can order a bone density scan since she is due for one  Best number [phone]

## 2017-09-15 NOTE — Telephone Encounter (Signed)
Spoke with Vicente Males and informed her of Dr.Kwiatkowski is out of the office and I will reach out to her asap. Vicente Males said there was no rush she just wanted to inform Dr.Kwiatkowski that she was due.

## 2017-09-21 NOTE — Telephone Encounter (Signed)
Okay to schedule follow-up bone density scan

## 2017-09-23 NOTE — Telephone Encounter (Signed)
Dexa scan referral placed. Vicente Males was informed of the update. No further action needed.

## 2017-10-17 ENCOUNTER — Ambulatory Visit: Payer: PPO | Admitting: Cardiovascular Disease

## 2017-10-26 ENCOUNTER — Inpatient Hospital Stay (HOSPITAL_COMMUNITY)
Admission: EM | Admit: 2017-10-26 | Discharge: 2017-10-31 | DRG: 291 | Disposition: A | Discharge status: Home Health | Payer: PPO | Attending: Family Medicine | Admitting: Family Medicine

## 2017-10-26 ENCOUNTER — Encounter (HOSPITAL_COMMUNITY): Payer: Self-pay | Admitting: Emergency Medicine

## 2017-10-26 ENCOUNTER — Emergency Department (HOSPITAL_COMMUNITY): Payer: PPO

## 2017-10-26 ENCOUNTER — Other Ambulatory Visit: Payer: Self-pay

## 2017-10-26 DIAGNOSIS — J189 Pneumonia, unspecified organism: Secondary | ICD-10-CM | POA: Diagnosis present

## 2017-10-26 DIAGNOSIS — C182 Malignant neoplasm of ascending colon: Secondary | ICD-10-CM | POA: Diagnosis present

## 2017-10-26 DIAGNOSIS — J9601 Acute respiratory failure with hypoxia: Secondary | ICD-10-CM | POA: Diagnosis present

## 2017-10-26 DIAGNOSIS — K922 Gastrointestinal hemorrhage, unspecified: Secondary | ICD-10-CM

## 2017-10-26 DIAGNOSIS — I2510 Atherosclerotic heart disease of native coronary artery without angina pectoris: Secondary | ICD-10-CM | POA: Diagnosis not present

## 2017-10-26 DIAGNOSIS — K921 Melena: Secondary | ICD-10-CM | POA: Diagnosis not present

## 2017-10-26 DIAGNOSIS — I482 Chronic atrial fibrillation, unspecified: Secondary | ICD-10-CM | POA: Diagnosis present

## 2017-10-26 DIAGNOSIS — F419 Anxiety disorder, unspecified: Secondary | ICD-10-CM | POA: Diagnosis present

## 2017-10-26 DIAGNOSIS — I259 Chronic ischemic heart disease, unspecified: Secondary | ICD-10-CM | POA: Diagnosis present

## 2017-10-26 DIAGNOSIS — F32A Depression, unspecified: Secondary | ICD-10-CM | POA: Diagnosis present

## 2017-10-26 DIAGNOSIS — R0602 Shortness of breath: Secondary | ICD-10-CM | POA: Diagnosis not present

## 2017-10-26 DIAGNOSIS — Z955 Presence of coronary angioplasty implant and graft: Secondary | ICD-10-CM

## 2017-10-26 DIAGNOSIS — K449 Diaphragmatic hernia without obstruction or gangrene: Secondary | ICD-10-CM | POA: Diagnosis present

## 2017-10-26 DIAGNOSIS — I340 Nonrheumatic mitral (valve) insufficiency: Secondary | ICD-10-CM | POA: Diagnosis present

## 2017-10-26 DIAGNOSIS — I248 Other forms of acute ischemic heart disease: Secondary | ICD-10-CM | POA: Diagnosis present

## 2017-10-26 DIAGNOSIS — K219 Gastro-esophageal reflux disease without esophagitis: Secondary | ICD-10-CM | POA: Diagnosis not present

## 2017-10-26 DIAGNOSIS — I272 Pulmonary hypertension, unspecified: Secondary | ICD-10-CM | POA: Diagnosis present

## 2017-10-26 DIAGNOSIS — J440 Chronic obstructive pulmonary disease with acute lower respiratory infection: Secondary | ICD-10-CM | POA: Diagnosis present

## 2017-10-26 DIAGNOSIS — I5033 Acute on chronic diastolic (congestive) heart failure: Secondary | ICD-10-CM | POA: Diagnosis present

## 2017-10-26 DIAGNOSIS — I200 Unstable angina: Secondary | ICD-10-CM | POA: Diagnosis not present

## 2017-10-26 DIAGNOSIS — R5383 Other fatigue: Secondary | ICD-10-CM | POA: Diagnosis present

## 2017-10-26 DIAGNOSIS — F329 Major depressive disorder, single episode, unspecified: Secondary | ICD-10-CM | POA: Diagnosis not present

## 2017-10-26 DIAGNOSIS — D509 Iron deficiency anemia, unspecified: Secondary | ICD-10-CM | POA: Diagnosis present

## 2017-10-26 DIAGNOSIS — Z8719 Personal history of other diseases of the digestive system: Secondary | ICD-10-CM

## 2017-10-26 DIAGNOSIS — Z96653 Presence of artificial knee joint, bilateral: Secondary | ICD-10-CM | POA: Diagnosis present

## 2017-10-26 DIAGNOSIS — Z8673 Personal history of transient ischemic attack (TIA), and cerebral infarction without residual deficits: Secondary | ICD-10-CM

## 2017-10-26 DIAGNOSIS — K279 Peptic ulcer, site unspecified, unspecified as acute or chronic, without hemorrhage or perforation: Secondary | ICD-10-CM

## 2017-10-26 DIAGNOSIS — K259 Gastric ulcer, unspecified as acute or chronic, without hemorrhage or perforation: Secondary | ICD-10-CM | POA: Diagnosis present

## 2017-10-26 DIAGNOSIS — G629 Polyneuropathy, unspecified: Secondary | ICD-10-CM | POA: Diagnosis present

## 2017-10-26 DIAGNOSIS — I481 Persistent atrial fibrillation: Secondary | ICD-10-CM | POA: Diagnosis present

## 2017-10-26 DIAGNOSIS — R1110 Vomiting, unspecified: Secondary | ICD-10-CM | POA: Diagnosis not present

## 2017-10-26 DIAGNOSIS — R112 Nausea with vomiting, unspecified: Secondary | ICD-10-CM | POA: Diagnosis present

## 2017-10-26 DIAGNOSIS — M1990 Unspecified osteoarthritis, unspecified site: Secondary | ICD-10-CM | POA: Diagnosis present

## 2017-10-26 DIAGNOSIS — Z933 Colostomy status: Secondary | ICD-10-CM

## 2017-10-26 DIAGNOSIS — K2951 Unspecified chronic gastritis with bleeding: Secondary | ICD-10-CM | POA: Diagnosis not present

## 2017-10-26 DIAGNOSIS — I4891 Unspecified atrial fibrillation: Secondary | ICD-10-CM

## 2017-10-26 DIAGNOSIS — I110 Hypertensive heart disease with heart failure: Secondary | ICD-10-CM | POA: Diagnosis present

## 2017-10-26 DIAGNOSIS — R0902 Hypoxemia: Secondary | ICD-10-CM | POA: Diagnosis present

## 2017-10-26 DIAGNOSIS — I25118 Atherosclerotic heart disease of native coronary artery with other forms of angina pectoris: Secondary | ICD-10-CM | POA: Diagnosis present

## 2017-10-26 DIAGNOSIS — D696 Thrombocytopenia, unspecified: Secondary | ICD-10-CM | POA: Diagnosis present

## 2017-10-26 DIAGNOSIS — I081 Rheumatic disorders of both mitral and tricuspid valves: Secondary | ICD-10-CM | POA: Diagnosis present

## 2017-10-26 DIAGNOSIS — R195 Other fecal abnormalities: Secondary | ICD-10-CM | POA: Diagnosis present

## 2017-10-26 DIAGNOSIS — R9439 Abnormal result of other cardiovascular function study: Secondary | ICD-10-CM | POA: Diagnosis present

## 2017-10-26 DIAGNOSIS — I10 Essential (primary) hypertension: Secondary | ICD-10-CM | POA: Diagnosis not present

## 2017-10-26 DIAGNOSIS — Z9049 Acquired absence of other specified parts of digestive tract: Secondary | ICD-10-CM

## 2017-10-26 DIAGNOSIS — M810 Age-related osteoporosis without current pathological fracture: Secondary | ICD-10-CM | POA: Diagnosis present

## 2017-10-26 DIAGNOSIS — Z87891 Personal history of nicotine dependence: Secondary | ICD-10-CM

## 2017-10-26 DIAGNOSIS — Z79899 Other long term (current) drug therapy: Secondary | ICD-10-CM

## 2017-10-26 DIAGNOSIS — Z7901 Long term (current) use of anticoagulants: Secondary | ICD-10-CM | POA: Diagnosis not present

## 2017-10-26 DIAGNOSIS — Z86010 Personal history of colonic polyps: Secondary | ICD-10-CM

## 2017-10-26 DIAGNOSIS — R51 Headache: Secondary | ICD-10-CM | POA: Diagnosis present

## 2017-10-26 DIAGNOSIS — Z85038 Personal history of other malignant neoplasm of large intestine: Secondary | ICD-10-CM

## 2017-10-26 DIAGNOSIS — R079 Chest pain, unspecified: Secondary | ICD-10-CM | POA: Diagnosis not present

## 2017-10-26 DIAGNOSIS — R05 Cough: Secondary | ICD-10-CM | POA: Diagnosis not present

## 2017-10-26 DIAGNOSIS — Z981 Arthrodesis status: Secondary | ICD-10-CM

## 2017-10-26 DIAGNOSIS — Z86718 Personal history of other venous thrombosis and embolism: Secondary | ICD-10-CM

## 2017-10-26 DIAGNOSIS — D649 Anemia, unspecified: Secondary | ICD-10-CM | POA: Diagnosis not present

## 2017-10-26 DIAGNOSIS — K920 Hematemesis: Secondary | ICD-10-CM | POA: Diagnosis present

## 2017-10-26 LAB — BASIC METABOLIC PANEL
Anion gap: 11 (ref 5–15)
BUN: 38 mg/dL — ABNORMAL HIGH (ref 6–20)
CO2: 25 mmol/L (ref 22–32)
Calcium: 9.2 mg/dL (ref 8.9–10.3)
Chloride: 105 mmol/L (ref 101–111)
Creatinine, Ser: 1.34 mg/dL — ABNORMAL HIGH (ref 0.44–1.00)
GFR calc Af Amer: 41 mL/min — ABNORMAL LOW (ref >60)
GFR calc non Af Amer: 35 mL/min — ABNORMAL LOW (ref >60)
Glucose, Bld: 105 mg/dL — ABNORMAL HIGH (ref 65–99)
Potassium: 4.3 mmol/L (ref 3.5–5.1)
Sodium: 141 mmol/L (ref 135–145)

## 2017-10-26 LAB — CBC
HCT: 31.9 % — ABNORMAL LOW (ref 36.0–46.0)
Hemoglobin: 9.6 g/dL — ABNORMAL LOW (ref 12.0–15.0)
MCH: 25.2 pg — ABNORMAL LOW (ref 26.0–34.0)
MCHC: 30.1 g/dL (ref 30.0–36.0)
MCV: 83.7 fL (ref 78.0–100.0)
Platelets: 135 10*3/uL — ABNORMAL LOW (ref 150–400)
RBC: 3.81 MIL/uL — ABNORMAL LOW (ref 3.87–5.11)
RDW: 17.6 % — ABNORMAL HIGH (ref 11.5–15.5)
WBC: 9.9 10*3/uL (ref 4.0–10.5)

## 2017-10-26 LAB — I-STAT TROPONIN, ED: Troponin i, poc: 0.02 ng/mL (ref 0.00–0.08)

## 2017-10-26 LAB — POC OCCULT BLOOD, ED: Fecal Occult Bld: POSITIVE — AB

## 2017-10-26 LAB — BRAIN NATRIURETIC PEPTIDE: B Natriuretic Peptide: 478.6 pg/mL — ABNORMAL HIGH (ref 0.0–100.0)

## 2017-10-26 MED ORDER — IOPAMIDOL (ISOVUE-370) INJECTION 76%
INTRAVENOUS | Status: AC
  Filled 2017-10-26: qty 100

## 2017-10-26 MED ORDER — SODIUM CHLORIDE 0.9 % IV SOLN
INTRAVENOUS | Status: DC
  Administered 2017-10-27: via INTRAVENOUS

## 2017-10-26 MED ORDER — SODIUM CHLORIDE 0.9 % IV SOLN
500.0000 mg | Freq: Once | INTRAVENOUS | Status: AC
  Administered 2017-10-26: 500 mg via INTRAVENOUS
  Filled 2017-10-26: qty 500

## 2017-10-26 MED ORDER — TRAZODONE HCL 50 MG PO TABS
50.0000 mg | ORAL_TABLET | Freq: Every day | ORAL | Status: DC
  Administered 2017-10-27 – 2017-10-30 (×5): 50 mg via ORAL
  Filled 2017-10-26 (×5): qty 1

## 2017-10-26 MED ORDER — GABAPENTIN 600 MG PO TABS
600.0000 mg | ORAL_TABLET | Freq: Two times a day (BID) | ORAL | Status: DC
  Administered 2017-10-27 – 2017-10-31 (×10): 600 mg via ORAL
  Filled 2017-10-26 (×10): qty 1

## 2017-10-26 MED ORDER — ONDANSETRON HCL 4 MG/2ML IJ SOLN
4.0000 mg | Freq: Once | INTRAMUSCULAR | Status: AC
  Administered 2017-10-26: 4 mg via INTRAVENOUS
  Filled 2017-10-26: qty 2

## 2017-10-26 MED ORDER — APIXABAN 5 MG PO TABS
5.0000 mg | ORAL_TABLET | Freq: Two times a day (BID) | ORAL | Status: DC
  Administered 2017-10-27: 5 mg via ORAL
  Filled 2017-10-26 (×2): qty 1

## 2017-10-26 MED ORDER — ONDANSETRON HCL 4 MG/2ML IJ SOLN
4.0000 mg | Freq: Four times a day (QID) | INTRAMUSCULAR | Status: DC | PRN
  Administered 2017-10-27 – 2017-10-29 (×4): 4 mg via INTRAVENOUS
  Filled 2017-10-26 (×4): qty 2

## 2017-10-26 MED ORDER — IOPAMIDOL (ISOVUE-370) INJECTION 76%
80.0000 mL | Freq: Once | INTRAVENOUS | Status: AC | PRN
  Administered 2017-10-26: 100 mL via INTRAVENOUS

## 2017-10-26 MED ORDER — ONDANSETRON HCL 4 MG PO TABS
4.0000 mg | ORAL_TABLET | Freq: Four times a day (QID) | ORAL | Status: DC | PRN
  Filled 2017-10-26: qty 1

## 2017-10-26 MED ORDER — SODIUM CHLORIDE 0.9 % IV SOLN
1.0000 g | Freq: Once | INTRAVENOUS | Status: AC
  Administered 2017-10-26: 1 g via INTRAVENOUS
  Filled 2017-10-26: qty 10

## 2017-10-26 MED ORDER — SERTRALINE HCL 50 MG PO TABS
50.0000 mg | ORAL_TABLET | Freq: Every day | ORAL | Status: DC
  Administered 2017-10-27 – 2017-10-31 (×5): 50 mg via ORAL
  Filled 2017-10-26 (×5): qty 1

## 2017-10-26 MED ORDER — SODIUM CHLORIDE 0.9 % IV SOLN
500.0000 mg | INTRAVENOUS | Status: DC
  Administered 2017-10-27 – 2017-10-28 (×2): 500 mg via INTRAVENOUS
  Filled 2017-10-26 (×2): qty 500

## 2017-10-26 MED ORDER — LEVALBUTEROL HCL 0.63 MG/3ML IN NEBU
0.6300 mg | INHALATION_SOLUTION | Freq: Four times a day (QID) | RESPIRATORY_TRACT | Status: DC
  Administered 2017-10-27 (×2): 0.63 mg via RESPIRATORY_TRACT
  Filled 2017-10-26 (×2): qty 3

## 2017-10-26 MED ORDER — ACETAMINOPHEN 325 MG PO TABS
650.0000 mg | ORAL_TABLET | Freq: Once | ORAL | Status: AC
  Administered 2017-10-26: 650 mg via ORAL
  Filled 2017-10-26: qty 2

## 2017-10-26 MED ORDER — METOPROLOL TARTRATE 100 MG PO TABS
100.0000 mg | ORAL_TABLET | Freq: Two times a day (BID) | ORAL | Status: DC
  Administered 2017-10-27 – 2017-10-31 (×9): 100 mg via ORAL
  Filled 2017-10-26 (×10): qty 1

## 2017-10-26 MED ORDER — FUROSEMIDE 10 MG/ML IJ SOLN
20.0000 mg | Freq: Two times a day (BID) | INTRAMUSCULAR | Status: DC
  Administered 2017-10-27 (×2): 20 mg via INTRAVENOUS
  Filled 2017-10-26 (×2): qty 2

## 2017-10-26 MED ORDER — AZITHROMYCIN 250 MG PO TABS: 500.0000 mg | ORAL_TABLET | Freq: Once | ORAL | Status: DC

## 2017-10-26 MED ORDER — METHYLPREDNISOLONE SODIUM SUCC 125 MG IJ SOLR
125.0000 mg | Freq: Once | INTRAMUSCULAR | Status: AC
  Administered 2017-10-26: 125 mg via INTRAVENOUS
  Filled 2017-10-26: qty 2

## 2017-10-26 MED ORDER — LORAZEPAM 0.5 MG PO TABS: 0.5000 mg | ORAL_TABLET | Freq: Two times a day (BID) | ORAL | Status: DC | PRN

## 2017-10-26 MED ORDER — DILTIAZEM HCL 30 MG PO TABS
30.0000 mg | ORAL_TABLET | Freq: Three times a day (TID) | ORAL | Status: DC
  Administered 2017-10-27 – 2017-10-31 (×15): 30 mg via ORAL
  Filled 2017-10-26 (×15): qty 1

## 2017-10-26 MED ORDER — VITAMIN C 500 MG PO TABS
1000.0000 mg | ORAL_TABLET | Freq: Every day | ORAL | Status: DC
  Administered 2017-10-27 – 2017-10-29 (×3): 1000 mg via ORAL
  Filled 2017-10-26 (×2): qty 2

## 2017-10-26 MED ORDER — SODIUM CHLORIDE 0.9 % IV SOLN: 1.0000 g | INTRAVENOUS | Status: DC

## 2017-10-26 MED ORDER — PANTOPRAZOLE SODIUM 40 MG PO TBEC
40.0000 mg | DELAYED_RELEASE_TABLET | Freq: Every day | ORAL | Status: DC
  Administered 2017-10-27: 40 mg via ORAL
  Filled 2017-10-26: qty 1

## 2017-10-26 MED ORDER — HYDROCODONE-ACETAMINOPHEN 5-325 MG PO TABS
1.0000 | ORAL_TABLET | Freq: Four times a day (QID) | ORAL | Status: DC | PRN
  Administered 2017-10-27 – 2017-10-30 (×3): 1 via ORAL
  Filled 2017-10-26 (×3): qty 1

## 2017-10-26 MED ORDER — IPRATROPIUM-ALBUTEROL 0.5-2.5 (3) MG/3ML IN SOLN
3.0000 mL | Freq: Once | RESPIRATORY_TRACT | Status: AC
  Administered 2017-10-26: 3 mL via RESPIRATORY_TRACT
  Filled 2017-10-26: qty 3

## 2017-10-26 MED ORDER — IPRATROPIUM BROMIDE 0.02 % IN SOLN
0.5000 mg | Freq: Four times a day (QID) | RESPIRATORY_TRACT | Status: DC
  Administered 2017-10-27 (×2): 0.5 mg via RESPIRATORY_TRACT
  Filled 2017-10-26 (×2): qty 2.5

## 2017-10-26 NOTE — ED Notes (Signed)
Called pt's name to take back to room, no answer. Nurse first RN notified.

## 2017-10-26 NOTE — ED Notes (Signed)
Attempted report x1. 

## 2017-10-26 NOTE — ED Notes (Signed)
Unsuccessful attempt to start IV for CT x 1. Crystal RN at bedside to attempt to start line. Upon taking a few sips of gingerale, pt began to dry heave. Dr Eulis Foster notified, verbal for 4mg  zofran placed.

## 2017-10-26 NOTE — ED Notes (Signed)
IV team at bedside 

## 2017-10-26 NOTE — ED Notes (Signed)
Patient transported to CT 

## 2017-10-26 NOTE — ED Notes (Signed)
Family and pt updated to delay in patient's scan.

## 2017-10-26 NOTE — ED Notes (Addendum)
Pt placed on 4L [place]. O2 increased to 97%

## 2017-10-26 NOTE — ED Notes (Signed)
CT notified pt ready for scan 

## 2017-10-26 NOTE — ED Notes (Signed)
[name], patient's daughter updated to patient room assignment

## 2017-10-26 NOTE — ED Notes (Signed)
Attempted to call patient's daughter for update with no response. Will call back shortly.

## 2017-10-26 NOTE — ED Notes (Signed)
ED Provider at bedside. 

## 2017-10-26 NOTE — ED Notes (Signed)
Pt given graham crackers and fresh gingerale. Able to eat per EDP

## 2017-10-26 NOTE — ED Notes (Addendum)
Phlebotomy at bedside. Pt updated to room assignment.

## 2017-10-26 NOTE — Progress Notes (Signed)
Pharmacy Antibiotic Note  [name] is a 84 y.o. female admitted on 10/26/2017 with pneumonia.  Pharmacy has been consulted for rocephin dosing. One time order 1g rocephin IV is currently running, also started azithro 500 mg x 1  Plan: - Rocephin 1g IV Q 24 hrs next dose 4/15 at 2200     Temp (24hrs), Avg:98.6 F (37 C), Min:98.6 F (37 C), Max:98.6 F (37 C)  Recent Labs  Lab 10/26/17 1230  WBC 9.9  CREATININE 1.34*    CrCl cannot be calculated (Unknown ideal weight.).    No Known Allergies  Thank you for allowing pharmacy to be a part of this patient's care.  Manley Mason 10/26/2017 10:05 PM

## 2017-10-26 NOTE — ED Notes (Signed)
Delay explained to pt and family. Awaiting CT results at this time. Pt and family verbalized understanding. Comfort measures provided. Pt O2 decreased to 2L [place]. O2 remained at approx 97%

## 2017-10-26 NOTE — ED Triage Notes (Signed)
Patient presents to ED for SOB x 1 week, hx of CHF, bilateral lower limb swelling.  PAtient c/o cough with brown phlegm, left sided chest pain, nausea and vomiting (2 weeks), denies diarrhea.

## 2017-10-26 NOTE — ED Notes (Signed)
Attempted report x 3.  

## 2017-10-26 NOTE — ED Notes (Signed)
Attempted report x 2. Call back number left

## 2017-10-26 NOTE — ED Provider Notes (Signed)
[facility] EMERGENCY DEPARTMENT Provider Note   CSN: [id number] Arrival date & time: 10/26/17  1204     History   Chief Complaint Chief Complaint  Patient presents with  . Shortness of Breath    HPI [name] is a 84 y.o. female.  Resents for ongoing nausea and vomiting with shortness of breath for 2 weeks.  In the last 2 days her shortness of breath has worsened and she feels more uncomfortable.  She is worried "that I will not be around for long."  She is with her daughter, who assists with history.  There is been no hematemesis.  She denies cough productive of sputum.  She feels short of breath, both when supine and with ambulation.  She has not had any diarrhea.  She denies fever, chills, back pain or leg pain.  She has a mild headache today.  There are no other known modifying factors.  HPI  Past Medical History:  Diagnosis Date  . Adenocarcinoma, colon [facility]) Oncologist-- Dr Truitt Merle   Multifocal (2) Colon cancer @ ileocecal valve and ascending ( [id number]), Stage IIIB, grade I, MMR normal , 2 of 16 +lymph nodes, negative surgical margins---  06-02-2014  Right hemicolectomy w/ colostomy  . Allergy   . Anxiety   . Asthma    inhaler "sometimes"  . CAD (coronary artery disease)    a. LHC 4/09: pLAD 40, mDx 70-75, pLCx 30, mLCx 50, mRCA 90, EF 60% >> PCI: BMS x2 to RCA;  b. Myoview 8/12: normal  . Cataract    bil cateracts removed  . Chronic anemia   . Chronic diastolic CHF (congestive heart failure) (HCC)    a. Echo 912 - Mild LVH, EF 55-60%, no RWMA, Gr 1 DD, mild MR, mild LAE, mild RAE, PASP 59 mmHg (mod to severe pulmo HTN)  . Clotting disorder (HCC)    hx of dvt, tia on eliquis  . Colostomy in place [facility])    s/p colostomy takedown 5/16  . Colovaginal fistula    s/p colostomy >> colostomy takedown 5/16  . COPD with chronic bronchitis ([place])   . Depression   . Dysrhythmia    A fib  . Essential hypertension   . GERD (gastroesophageal reflux  disease)   . History of adenomatous polyp of colon   . History of blood transfusion   . History of DVT of lower extremity   . History of hiatal hernia   . History of TIA (transient ischemic attack)    a. Head CT 6/15: small chronic lacunar infarct in thalamus  . Hyperlipidemia   . OA (osteoarthritis)   . Osteoporosis   . Peripheral neuropathy   . Pneumonia   . Pulmonary HTN ([place])    a. PASP on Echo in 9/12:  59 mmHg  . Renal insufficiency   . Sigmoid diverticulosis    s/p sigmoid colectomy  . Stroke ([place])    TIAs  . Wears dentures   . Wears glasses     Patient Active Problem List   Diagnosis Date Noted  . Displaced fracture of lateral malleolus of right fibula, subsequent encounter for closed fracture with routine healing 12/27/2016  . Abdominal pain, epigastric 11/12/2016  . Nausea and vomiting 11/12/2016  . Iron deficiency anemia 11/12/2016  . Chronic anticoagulation 11/12/2016  . Chronic atrial fibrillation ([place]) 08/05/2016  . Coronary artery disease of native artery of native heart with stable angina pectoris ([place]) 08/05/2016  . Persistent atrial fibrillation ([place])  08/25/2015  . Failed total left knee replacement ([place]) 08/02/2015  . Sigmoid diverticulitis 11/24/2014  . Acute blood loss anemia 06/13/2014  . Neuropathic pain 06/13/2014  . Insomnia 06/13/2014  . Anxiety and depression 06/13/2014  . Cancer of ascending colon ([place]) 06/13/2014  . Small bowel obstruction ([place]) 05/31/2014  . Colovaginal fistula 04/20/2014  . Lethargy 09/15/2013  . DEPRESSION 02/07/2010  . DIASTOLIC HEART FAILURE, CHRONIC 02/27/2009  . PURE HYPERCHOLESTEROLEMIA 04/05/2008  . Coronary atherosclerosis 04/05/2008  . Osteoarthritis 05/19/2007  . Essential hypertension 01/21/2007  . GERD 01/21/2007  . Hiatal hernia 01/21/2007  . Diverticulosis of large intestine 01/21/2007  . COLONIC POLYPS, HX OF 01/21/2007    Past Surgical History:  Procedure Laterality Date  . ABDOMINAL HYSTERECTOMY     . ANTERIOR CERVICAL DECOMP/DISCECTOMY FUSION  01-31-2009   C5 -- 7  . APPENDECTOMY    . Bone spurs Bilateral    Feet  . CARDIOVASCULAR STRESS TEST  02-26-2011   Normal lexiscan no exercise study/  no ischemia/  normal LV function and wall motion, ef 67%  . CARPAL TUNNEL RELEASE Right   . CATARACT EXTRACTION W/ INTRAOCULAR LENS  IMPLANT, BILATERAL Bilateral   . CHOLECYSTECTOMY    . COLOSTOMY N/A 06/02/2014   Procedure: DIVERTING DESCENDING END COLOSTOMY;  Surgeon: Donnie Mesa, MD;  Location: [place];  Service: General;  Laterality: N/A;  . CORONARY ANGIOPLASTY WITH STENT PLACEMENT  11-04-2007  dr bensimhon   BMS x2 to RCA/  mild Non-obstructive disease LAD/  normal LVF  . DILATION AND CURETTAGE OF UTERUS    . EVALUATION UNDER ANESTHESIA WITH ANAL FISTULECTOMY N/A 10/13/2014   Procedure: ANAL EXAM UNDER ANESTHESIA ;  Surgeon: [name], MD;  Location: [facility];  Service: General;  Laterality: N/A;  . HAND SURGERY     Tendon repair  . LAPAROSCOPIC SIGMOID COLECTOMY N/A 11/24/2014   Procedure: SIGMOID COLECTOMY AND COLSOTOMY CLOSURE;  Surgeon: Donnie Mesa, MD;  Location: [place];  Service: General;  Laterality: N/A;  . LUMBAR LAMINECTOMY  10-29-2002,  1969   Left  L3 -- 4  decompression  . PARTIAL COLECTOMY N/A 06/02/2014   Procedure: RIGHT PARTIAL COLECTOMY ;  Surgeon: Donnie Mesa, MD;  Location: [place];  Service: General;  Laterality: N/A;  . PATELLECTOMY Right 09/14/2013   Procedure: PATELLECTOMY;  Surgeon: Meredith Pel, MD;  Location: [facility];  Service: Orthopedics;  Laterality: Right;  . PROCTOSCOPY N/A 10/13/2014   Procedure: RIDGE PROCTOSCOPY;  Surgeon: [name], MD;  Location: [facility];  Service: General;  Laterality: N/A;  . REVISION TOTAL KNEE ARTHROPLASTY Bilateral right  04-02-2011/  left 1996 & 10-05-1999  . ROTATOR CUFF REPAIR Bilateral   . TONSILLECTOMY    . TOTAL KNEE ARTHROPLASTY Bilateral left 1994/  right 2000  . TOTAL KNEE REVISION Left 08/02/2015   Procedure:  LEFT FEMORAL REVISION;  Surgeon: Gaynelle Arabian, MD;  Location: [facility];  Service: Orthopedics;  Laterality: Left;  . TRANSTHORACIC ECHOCARDIOGRAM  12-01-2009   Grade I diastolic dysfunction/  ef 60%/  moderate MR/  mild TR     OB History   None      Home Medications    Prior to Admission medications   Medication Sig Start Date End Date Taking? Authorizing Provider  acetaminophen (TYLENOL) 325 MG tablet Take 650 mg by mouth every 6 (six) hours as needed for moderate pain.    Yes [provider]  apixaban (ELIQUIS) 5 MG TABS tablet Take 1 tablet (5 mg total)  by mouth 2 (two) times daily. 10/23/16  Yes Marletta Lor, MD  Ascorbic Acid (VITAMIN C) 1000 MG tablet Take 1,000 mg by mouth daily.   Yes [provider]  benazepril (LOTENSIN) 40 MG tablet Take 40 mg by mouth daily. 10/21/17  Yes [provider]  diltiazem (CARDIZEM) 30 MG tablet Take 1 tablet (30 mg total) by mouth every 8 (eight) hours. 10/23/16  Yes Marletta Lor, MD  esomeprazole (NEXIUM) 20 MG capsule Take 1 capsule (20 mg total) by mouth daily at 12 noon. Patient taking differently: Take 20 mg by mouth daily.  10/23/16  Yes Marletta Lor, MD  ferrous sulfate 325 (65 FE) MG EC tablet Take 325 mg by mouth daily with breakfast.   Yes [provider]  furosemide (LASIX) 20 MG tablet Take 1 tablet (20 mg total) by mouth daily. 08/06/17 11/04/17 Yes Simmons, Brittainy M, PA-C  gabapentin (NEURONTIN) 600 MG tablet Take 600 mg by mouth 2 (two) times daily.   Yes [provider]  HYDROcodone-acetaminophen (NORCO/VICODIN) 5-325 MG tablet Take 1-2 tablets by mouth every 6 (six) hours as needed for moderate pain. 08/04/17  Yes Marletta Lor, MD  LORazepam (ATIVAN) 0.5 MG tablet TAKE 1 TABLET BY MOUTH TWICE DAILY AS NEEDED FOR ANXIETY OR  SLEEP 08/04/17  Yes Marletta Lor, MD  metoprolol (LOPRESSOR) 100 MG tablet Take 1 tablet (100 mg total) by mouth 2 (two) times daily.  10/23/16  Yes Marletta Lor, MD  nitroGLYCERIN (NITROSTAT) 0.4 MG SL tablet Place 1 tablet (0.4 mg total) under the tongue every 5 (five) minutes as needed for chest pain (x 3 pills). Reported on 09/01/2015 10/23/16  Yes Marletta Lor, MD  promethazine (PHENERGAN) 12.5 MG tablet TAKE 1 TABLET (12.5 MG TOTAL) BY MOUTH EVERY 8 HOURS AS NEEDED FOR NAUSEA OR VOMITING. Patient taking differently: Take 12.5 mg by mouth every 8 (eight) hours as needed for nausea or vomiting.  10/23/16  Yes Marletta Lor, MD  sertraline (ZOLOFT) 50 MG tablet Take 1 tablet (50 mg total) by mouth daily. 10/23/16  Yes Marletta Lor, MD  traZODone (DESYREL) 100 MG tablet Take 0.5 tablets (50 mg total) by mouth at bedtime. 10/23/16  Yes Marletta Lor, MD  triamcinolone cream (KENALOG) 0.1 % Apply 1 application topically 2 (two) times daily as needed (Eczema on legs). 10/23/16  Yes Marletta Lor, MD    Family History Family History  Problem Relation Age of Onset  . Osteoarthritis Mother   . Heart failure Mother   . Colon cancer Maternal Uncle 78  . Heart Problems Father   . Cancer Sister 66       ? colon cancer   . Kidney cancer Brother 43  . Esophageal cancer Neg Hx   . Rectal cancer Neg Hx   . Stomach cancer Neg Hx   . Pancreatic cancer Neg Hx     Social History Social History   Tobacco Use  . Smoking status: Former Smoker    Packs/day: 0.50    Years: 15.00    Pack years: 7.50    Types: Cigarettes    Last attempt to quit: 07/16/1967    Years since quitting: 50.3  . Smokeless tobacco: Never Used  Substance Use Topics  . Alcohol use: No    Alcohol/week: 0.0 oz  . Drug use: No     Allergies   Patient has no known allergies.   Review of Systems Review of Systems  All other systems reviewed and are negative.    Physical Exam Updated Vital Signs BP 131/79   Pulse 98   Temp 98.6 F (37 C) (Oral)   Resp 20   SpO2 97%   Physical Exam  Constitutional: She is  oriented to person, place, and time. She appears well-developed. She appears ill.  Elderly, frail  HENT:  Head: Normocephalic and atraumatic.  Eyes: Pupils are equal, round, and reactive to light. Conjunctivae and EOM are normal.  Neck: Normal range of motion and phonation normal. Neck supple.  Cardiovascular: Normal rate and regular rhythm.  Mild hypertension  Pulmonary/Chest: Effort normal. No stridor. She has no rales. She exhibits no tenderness.  Few scattered wheezes and somewhat decreased air movement bilaterally.  No respiratory distress  Abdominal: Soft. She exhibits no distension. There is no tenderness. There is no guarding.  Musculoskeletal: Normal range of motion. She exhibits no edema or deformity.  Neurological: She is alert and oriented to person, place, and time. She exhibits normal muscle tone.  Skin: Skin is warm and dry.  Psychiatric: She has a normal mood and affect. Her behavior is normal.  Nursing note and vitals reviewed.    ED Treatments / Results  Labs (all labs ordered are listed, but only abnormal results are displayed) Labs Reviewed  BASIC METABOLIC PANEL - Abnormal; Notable for the following components:      Result Value   Glucose, Bld 105 (*)    BUN 38 (*)    Creatinine, Ser 1.34 (*)    GFR calc non Af Amer 35 (*)    GFR calc Af Amer 41 (*)    All other components within normal limits  CBC - Abnormal; Notable for the following components:   RBC 3.81 (*)    Hemoglobin 9.6 (*)    HCT 31.9 (*)    MCH 25.2 (*)    RDW 17.6 (*)    Platelets 135 (*)    All other components within normal limits  BRAIN NATRIURETIC PEPTIDE - Abnormal; Notable for the following components:   B Natriuretic Peptide 478.6 (*)    All other components within normal limits  POC OCCULT BLOOD, ED - Abnormal; Notable for the following components:   Fecal Occult Bld POSITIVE (*)    All other components within normal limits  I-STAT TROPONIN, ED    EKG EKG  Interpretation  Date/Time:  Sunday October 26 2017 12:35:58 EDT Ventricular Rate:  79 PR Interval:    QRS Duration: 76 QT Interval:  376 QTC Calculation: 431 R Axis:   99 Text Interpretation:  Atrial fibrillation with premature ventricular or aberrantly conducted complexes Rightward axis ST & T wave abnormality, consider inferior ischemia Abnormal ECG Since last tracing Atrial fibrillation is new Confirmed by Daleen Bo [phone]) on 10/26/2017 4:34:24 PM   Radiology Dg Chest 2 View  Result Date: 10/26/2017 CLINICAL DATA:  Chest pain and increased SOB x 4 days. Hx COPD, PNA, heart stents, stroke. EXAM: CHEST - 2 VIEW COMPARISON:  Chest x-ray dated 07/31/2017. FINDINGS: Stable cardiomegaly. Lungs are clear. No pleural effusion or pneumothorax seen. Stable elevation/eventration of the RIGHT hemidiaphragm. No acute or suspicious osseous finding. IMPRESSION: No active cardiopulmonary disease. No evidence of pneumonia or pulmonary edema. Stable cardiomegaly. Electronically Signed   By: Franki Cabot M.D.   On: 10/26/2017 13:19   Ct Angio Chest Pe W/cm &/or Wo Cm  Result Date: 10/26/2017 CLINICAL DATA:  Short of breath bilateral lower limb swelling cough history of DVT  EXAM: CT ANGIOGRAPHY CHEST WITH CONTRAST TECHNIQUE: Multidetector CT imaging of the chest was performed using the standard protocol during bolus administration of intravenous contrast. Multiplanar CT image reconstructions and MIPs were obtained to evaluate the vascular anatomy. CONTRAST:  174m ISOVUE-370 IOPAMIDOL (ISOVUE-370) INJECTION 76% COMPARISON:  Chest x-ray 10/26/2017, CT chest 07/22/2017, 09/12/2016 FINDINGS: Cardiovascular: Satisfactory opacification of the pulmonary arteries to the segmental level. No evidence of pulmonary embolism. Moderate aortic atherosclerosis. No aneurysmal dilatation. Coronary vascular calcification. Mild cardiomegaly. No pericardial effusion. Mediastinum/Nodes: Midline trachea. No thyroid mass. Stable 11  mm low right paratracheal lymph nodes. Esophagus within normal limits Lungs/Pleura: Bilateral mosaic pattern, more prominent compared to prior with right greater than left ground-glass densities and nodularity, increased compared to the prior chest CT. No pleural effusion or pneumothorax. Upper Abdomen: Reflux of contrast into the hepatic veins. Surgical clips in the right upper quadrant. Musculoskeletal: Degenerative changes. No acute or suspicious abnormality. Review of the MIP images confirms the above findings. IMPRESSION: 1. Negative for acute pulmonary embolus. 2. Right greater than left ground-glass density with increased nodularity in the right upper, middle and lower lobes, suspicious for pulmonary infection, possible atypical pneumonia. Short interval CT follow-up recommended following antibiotic trial and ensure clearing. Persistent mediastinal adenopathy, possibly reactive and not significantly changed since January 2019. Aortic Atherosclerosis (ICD10-I70.0). Electronically Signed   By: KDonavan FoilM.D.   On: 10/26/2017 21:03    Procedures .Critical Care Performed by: WDaleen Bo MD Authorized by: WDaleen Bo MD   Critical care provider statement:    Critical care time (minutes):  45   Critical care start time:  10/26/2017 4:32 PM   Critical care end time:  10/26/2017 9:24 PM   Critical care time was exclusive of:  Separately billable procedures and treating other patients   Critical care was necessary to treat or prevent imminent or life-threatening deterioration of the following conditions:  Respiratory failure   Critical care was time spent personally by me on the following activities:  Blood draw for specimens, development of treatment plan with patient or surrogate, discussions with consultants, evaluation of patient's response to treatment, examination of patient, obtaining history from patient or surrogate, ordering and performing treatments and interventions, ordering and  review of laboratory studies, pulse oximetry, re-evaluation of patient's condition, review of old charts and ordering and review of radiographic studies   (including critical care time)  Medications Ordered in ED Medications  iopamidol (ISOVUE-370) 76 % injection (has no administration in time range)  cefTRIAXone (ROCEPHIN) 1 g in sodium chloride 0.9 % 100 mL IVPB (has no administration in time range)  azithromycin (ZITHROMAX) 500 mg in sodium chloride 0.9 % 250 mL IVPB (has no administration in time range)  ipratropium-albuterol (DUONEB) 0.5-2.5 (3) MG/3ML nebulizer solution 3 mL (3 mLs Nebulization Given 10/26/17 1733)  acetaminophen (TYLENOL) tablet 650 mg (650 mg Oral Given 10/26/17 1726)  methylPREDNISolone sodium succinate (SOLU-MEDROL) 125 mg/2 mL injection 125 mg (125 mg Intravenous Given 10/26/17 1727)  ondansetron (ZOFRAN) injection 4 mg (4 mg Intravenous Given 10/26/17 1746)  iopamidol (ISOVUE-370) 76 % injection 80 mL (100 mLs Intravenous Contrast Given 10/26/17 2007)     Initial Impression / Assessment and Plan / ED Course  I have reviewed the triage vital signs and the nursing notes.  Pertinent labs & imaging results that were available during my care of the patient were reviewed by me and considered in my medical decision making (see chart for details).  Clinical Course as of Oct 27 2122  Sun Oct 26, 2017  2120 Abnormal, blood present  POC occult blood, ED Provider will collect(!) [EW]  2121 Mild elevation  Brain natriuretic peptide(!) [EW]  2121 Normal except hemoglobin low 9.6, lower than baseline, and platelets low 135  CBC(!) [EW]  2121 Normal  I-stat troponin, ED [EW]  2121 Normal except elevated glucose, BUN, and creatinine.  Basic metabolic panel(!) [EW]  5638 No acute disease  DG Chest 2 View [EW]  2122 CT Angio Chest PE W/Cm &/Or Wo Cm [EW]  2122 No pulmonary embolus.  Diffuse right-sided infection possibly atypical pneumonia.  Reactive mediastinal adenopathy.    [EW]    Clinical Course User Index [EW] Daleen Bo, MD    CHA2DS2/VAS Stroke Risk Points  Current as of 46 minutes ago     6 >= 2 Points: High Risk  1 - 1.99 Points: Medium Risk  0 Points: Low Risk    This is the only CHA2DS2/VAS Stroke Risk Points available for the past  year.:  Last Change: N/A     Details    This score determines the patient's risk of having a stroke if the  patient has atrial fibrillation.       Points Metrics  1 Has Congestive Heart Failure:  Yes    Current as of 46 minutes ago  1 Has Vascular Disease:  Yes    Current as of 46 minutes ago  1 Has Hypertension:  Yes    Current as of 46 minutes ago  2 Age:  68    Current as of 46 minutes ago  0 Has Diabetes:  No    Current as of 46 minutes ago  0 Had Stroke:  No  Had TIA:  No  Had thromboembolism:  No    Current as of 46 minutes ago  1 Female:  Yes    Current as of 46 minutes ago           Patient Vitals for the past 24 hrs:  BP Temp Temp src Pulse Resp SpO2  10/26/17 2115 - - - 98 20 97 %  10/26/17 2105 - - - 97 13 95 %  10/26/17 2100 131/79 - - 96 14 96 %  10/26/17 2045 - - - 97 18 96 %  10/26/17 2033 - - - 94 17 95 %  10/26/17 2030 - - - 98 15 97 %  10/26/17 2015 - - - 94 17 97 %  10/26/17 2000 (!) 150/83 - - 97 (!) 23 98 %  10/26/17 1944 - - - 91 18 98 %  10/26/17 1930 139/81 - - 87 18 97 %  10/26/17 1915 - - - 90 15 98 %  10/26/17 1900 (!) 143/76 - - 90 18 98 %  10/26/17 1840 - - - 89 16 97 %  10/26/17 1830 (!) 146/78 - - 92 14 (!) 86 %  10/26/17 1800 139/74 - - 96 (!) 26 92 %  10/26/17 1745 - - - 89 (!) 21 95 %  10/26/17 1700 (!) 161/94 - - 85 20 92 %  10/26/17 1630 (!) 169/100 - - - 17 -  10/26/17 1600 (!) 169/109 - - 85 16 93 %  10/26/17 1530 - - - 96 20 98 %  10/26/17 1524 - - - 88 16 99 %  10/26/17 1520 (!) 154/102 - - - (!) 25 -  10/26/17 1216 140/83 98.6 F (37 C) Oral (!) 101 19 97 %    9:20 PM Reevaluation  with update and discussion. After initial assessment and  treatment, an updated evaluation reveals patient continues to be uncomfortable.  She has required oxygen to be placed for oxygen saturation 86% on room air.  Currently on 2 L nasal cannula she is 100% oxygen saturation.  Findings and plan discussed with patient and daughter, all questions answered. Daleen Bo    MDM-evaluation consistent with pneumonia, community-acquired, without signs for severe sepsis.  Pneumonia is diffuse.  Doubt severe sepsis.  Ongoing nausea with vomiting, is nonspecific.  Possibly related to hiatal hernia, and dyspepsia.  Nursing Notes Reviewed/ Care Coordinated Applicable Imaging Reviewed Interpretation of Laboratory Data incorporated into ED treatment   9:24 PM-Consult complete with hospitalist. Patient case explained and discussed.  He agrees to admit patient for further evaluation and treatment. Call ended at 9:35 PM  Plan: Admit   Final Clinical Impressions(s) / ED Diagnoses   Final diagnoses:  Community acquired pneumonia of right lung, unspecified part of lung  Hypoxia  Nausea and vomiting, intractability of vomiting not specified, unspecified vomiting type  Atrial fibrillation, unspecified type [facility])    ED Discharge Orders    None       Daleen Bo, MD 10/26/17 2136

## 2017-10-26 NOTE — H&P (Addendum)
TRH H&P    Patient Demographics:    [name], is a 84 y.o. female  MRN: [id number]  DOB - 1930/06/22  Admit Date - 10/26/2017  Referring MD/NP/PA: Dr. Eulis Foster  Outpatient Primary MD for the patient is Marletta Lor, MD  Patient coming from: home  Chief complaint- shortness of breath   HPI:    [name]  is a 84 y.o. female, with history of CAD, diastolic CHF, hypertension, chronic atrial fibrillation on anticoagulation with Eliquis, GERD, hyperlipidemia who was brought to hospital with complaints of shortness of breath for past 3-4 days.  Patient also has been having nausea for past 2 weeks with intermittent vomiting.  She denies diarrhea.  Patient has been coughing up brown colored phlegm.  Also complains of some chills but no fever.  Patient does not smoke cigarettes.  Does not use inhalers. She also complains of chest pain, has been more short of breath than usual. Patient says that she has not been able to sleep for past 3-4 nights as she was unable to lay flat Complains of orthopnea. She has gained weight over the past few days, increased leg swelling more than usual.  She takes Lasix at home. Denies dysuria, urgency or frequency of urination. Denies abdominal pain.  In the ED, chest x-ray was without abnormality, CT angios chest was done which showed bilateral groundglass opacities in the lungs right more than left.  Patient started on ceftriaxone and Zithromax for pneumonia.    Review of systems:    In addition to the HPI above,    All other systems reviewed and are negative.   With Past History of the following :    Past Medical History:  Diagnosis Date  . Adenocarcinoma, colon [facility]) Oncologist-- Dr Truitt Merle   Multifocal (2) Colon cancer @ ileocecal valve and ascending ( [id number]), Stage IIIB, grade I, MMR normal , 2 of 16 +lymph nodes, negative surgical margins---  06-02-2014  Right  hemicolectomy w/ colostomy  . Allergy   . Anxiety   . Asthma    inhaler "sometimes"  . CAD (coronary artery disease)    a. LHC 4/09: pLAD 40, mDx 70-75, pLCx 30, mLCx 50, mRCA 90, EF 60% >> PCI: BMS x2 to RCA;  b. Myoview 8/12: normal  . Cataract    bil cateracts removed  . Chronic anemia   . Chronic diastolic CHF (congestive heart failure) (HCC)    a. Echo 912 - Mild LVH, EF 55-60%, no RWMA, Gr 1 DD, mild MR, mild LAE, mild RAE, PASP 59 mmHg (mod to severe pulmo HTN)  . Clotting disorder (HCC)    hx of dvt, tia on eliquis  . Colostomy in place [facility])    s/p colostomy takedown 5/16  . Colovaginal fistula    s/p colostomy >> colostomy takedown 5/16  . COPD with chronic bronchitis ([place])   . Depression   . Dysrhythmia    A fib  . Essential hypertension   . GERD (gastroesophageal reflux disease)   . History of adenomatous polyp of  colon   . History of blood transfusion   . History of DVT of lower extremity   . History of hiatal hernia   . History of TIA (transient ischemic attack)    a. Head CT 6/15: small chronic lacunar infarct in thalamus  . Hyperlipidemia   . OA (osteoarthritis)   . Osteoporosis   . Peripheral neuropathy   . Pneumonia   . Pulmonary HTN ([place])    a. PASP on Echo in 9/12:  59 mmHg  . Renal insufficiency   . Sigmoid diverticulosis    s/p sigmoid colectomy  . Stroke ([place])    TIAs  . Wears dentures   . Wears glasses       Past Surgical History:  Procedure Laterality Date  . ABDOMINAL HYSTERECTOMY    . ANTERIOR CERVICAL DECOMP/DISCECTOMY FUSION  01-31-2009   C5 -- 7  . APPENDECTOMY    . Bone spurs Bilateral    Feet  . CARDIOVASCULAR STRESS TEST  02-26-2011   Normal lexiscan no exercise study/  no ischemia/  normal LV function and wall motion, ef 67%  . CARPAL TUNNEL RELEASE Right   . CATARACT EXTRACTION W/ INTRAOCULAR LENS  IMPLANT, BILATERAL Bilateral   . CHOLECYSTECTOMY    . COLOSTOMY N/A 06/02/2014   Procedure: DIVERTING DESCENDING END  COLOSTOMY;  Surgeon: Donnie Mesa, MD;  Location: [place];  Service: General;  Laterality: N/A;  . CORONARY ANGIOPLASTY WITH STENT PLACEMENT  11-04-2007  dr bensimhon   BMS x2 to RCA/  mild Non-obstructive disease LAD/  normal LVF  . DILATION AND CURETTAGE OF UTERUS    . EVALUATION UNDER ANESTHESIA WITH ANAL FISTULECTOMY N/A 10/13/2014   Procedure: ANAL EXAM UNDER ANESTHESIA ;  Surgeon: [name], MD;  Location: [facility];  Service: General;  Laterality: N/A;  . HAND SURGERY     Tendon repair  . LAPAROSCOPIC SIGMOID COLECTOMY N/A 11/24/2014   Procedure: SIGMOID COLECTOMY AND COLSOTOMY CLOSURE;  Surgeon: Donnie Mesa, MD;  Location: [place];  Service: General;  Laterality: N/A;  . LUMBAR LAMINECTOMY  10-29-2002,  1969   Left  L3 -- 4  decompression  . PARTIAL COLECTOMY N/A 06/02/2014   Procedure: RIGHT PARTIAL COLECTOMY ;  Surgeon: Donnie Mesa, MD;  Location: [place];  Service: General;  Laterality: N/A;  . PATELLECTOMY Right 09/14/2013   Procedure: PATELLECTOMY;  Surgeon: Meredith Pel, MD;  Location: [place];  Service: Orthopedics;  Laterality: Right;  . PROCTOSCOPY N/A 10/13/2014   Procedure: RIDGE PROCTOSCOPY;  Surgeon: [name], MD;  Location: [facility];  Service: General;  Laterality: N/A;  . REVISION TOTAL KNEE ARTHROPLASTY Bilateral right  04-02-2011/  left 1996 & 10-05-1999  . ROTATOR CUFF REPAIR Bilateral   . TONSILLECTOMY    . TOTAL KNEE ARTHROPLASTY Bilateral left 1994/  right 2000  . TOTAL KNEE REVISION Left 08/02/2015   Procedure: LEFT FEMORAL REVISION;  Surgeon: Gaynelle Arabian, MD;  Location: [facility];  Service: Orthopedics;  Laterality: Left;  . TRANSTHORACIC ECHOCARDIOGRAM  12-01-2009   Grade I diastolic dysfunction/  ef 60%/  moderate MR/  mild TR      Social History:      Social History   Tobacco Use  . Smoking status: Former Smoker    Packs/day: 0.50    Years: 15.00    Pack years: 7.50    Types: Cigarettes    Last attempt to quit: 07/16/1967    Years since  quitting: 50.3  . Smokeless tobacco: Never Used  Substance  Use Topics  . Alcohol use: No    Alcohol/week: 0.0 oz       Family History :     Family History  Problem Relation Age of Onset  . Osteoarthritis Mother   . Heart failure Mother   . Colon cancer Maternal Uncle 34  . Heart Problems Father   . Cancer Sister 89       ? colon cancer   . Kidney cancer Brother 58  . Esophageal cancer Neg Hx   . Rectal cancer Neg Hx   . Stomach cancer Neg Hx   . Pancreatic cancer Neg Hx       Home Medications:   Prior to Admission medications   Medication Sig Start Date End Date Taking? Authorizing Provider  acetaminophen (TYLENOL) 325 MG tablet Take 650 mg by mouth every 6 (six) hours as needed for moderate pain.    Yes [provider]  apixaban (ELIQUIS) 5 MG TABS tablet Take 1 tablet (5 mg total) by mouth 2 (two) times daily. 10/23/16  Yes Marletta Lor, MD  Ascorbic Acid (VITAMIN C) 1000 MG tablet Take 1,000 mg by mouth daily.   Yes [provider]  benazepril (LOTENSIN) 40 MG tablet Take 40 mg by mouth daily. 10/21/17  Yes [provider]  diltiazem (CARDIZEM) 30 MG tablet Take 1 tablet (30 mg total) by mouth every 8 (eight) hours. 10/23/16  Yes Marletta Lor, MD  esomeprazole (NEXIUM) 20 MG capsule Take 1 capsule (20 mg total) by mouth daily at 12 noon. Patient taking differently: Take 20 mg by mouth daily.  10/23/16  Yes Marletta Lor, MD  ferrous sulfate 325 (65 FE) MG EC tablet Take 325 mg by mouth daily with breakfast.   Yes [provider]  furosemide (LASIX) 20 MG tablet Take 1 tablet (20 mg total) by mouth daily. 08/06/17 11/04/17 Yes Simmons, Brittainy M, PA-C  gabapentin (NEURONTIN) 600 MG tablet Take 600 mg by mouth 2 (two) times daily.   Yes [provider]  HYDROcodone-acetaminophen (NORCO/VICODIN) 5-325 MG tablet Take 1-2 tablets by mouth every 6 (six) hours as needed for moderate pain. 08/04/17  Yes Marletta Lor, MD  LORazepam (ATIVAN) 0.5 MG tablet TAKE 1 TABLET BY MOUTH TWICE DAILY AS NEEDED FOR ANXIETY OR  SLEEP 08/04/17  Yes Marletta Lor, MD  metoprolol (LOPRESSOR) 100 MG tablet Take 1 tablet (100 mg total) by mouth 2 (two) times daily. 10/23/16  Yes Marletta Lor, MD  nitroGLYCERIN (NITROSTAT) 0.4 MG SL tablet Place 1 tablet (0.4 mg total) under the tongue every 5 (five) minutes as needed for chest pain (x 3 pills). Reported on 09/01/2015 10/23/16  Yes Marletta Lor, MD  promethazine (PHENERGAN) 12.5 MG tablet TAKE 1 TABLET (12.5 MG TOTAL) BY MOUTH EVERY 8 HOURS AS NEEDED FOR NAUSEA OR VOMITING. Patient taking differently: Take 12.5 mg by mouth every 8 (eight) hours as needed for nausea or vomiting.  10/23/16  Yes Marletta Lor, MD  sertraline (ZOLOFT) 50 MG tablet Take 1 tablet (50 mg total) by mouth daily. 10/23/16  Yes Marletta Lor, MD  traZODone (DESYREL) 100 MG tablet Take 0.5 tablets (50 mg total) by mouth at bedtime. 10/23/16  Yes Marletta Lor, MD  triamcinolone cream (KENALOG) 0.1 % Apply 1 application topically 2 (two) times daily as needed (Eczema on legs). 10/23/16  Yes Marletta Lor, MD     Allergies:    No Known Allergies   Physical  Exam:   Vitals  Blood pressure 122/76, pulse 99, temperature 98.6 F (37 C), temperature source Oral, resp. rate 18, SpO2 100 %.  1.  General: Appears in no acute distress  2. Psychiatric:  Intact judgement and  insight, awake alert, oriented x 3.  3. Neurologic: No focal neurological deficits, all cranial nerves intact.Strength 5/5 all 4 extremities, sensation intact all 4 extremities, plantars down going.  4. Eyes :  anicteric sclerae, moist conjunctivae with no lid lag. PERRLA.  5. ENMT:  Oropharynx clear with moist mucous membranes and good dentition  6. Neck:  supple, no cervical lymphadenopathy appriciated, No thyromegaly  7. Respiratory : Normal respiratory effort, good air  movement bilaterally, bilateral crackles auscultated at lung bases.  8. Cardiovascular : RRR, no gallops, rubs or murmurs, bilateral 2+ pitting edema of the lower extremities  9. Gastrointestinal:  Positive bowel sounds, abdomen soft, non-tender to palpation,no hepatosplenomegaly, no rigidity or guarding       10. Skin:  No cyanosis, normal texture and turgor, no rash, lesions or ulcers  11.Musculoskeletal:  Good muscle tone,  joints appear normal , no effusions,  normal range of motion    Data Review:    CBC Recent Labs  Lab 10/26/17 1230  WBC 9.9  HGB 9.6*  HCT 31.9*  PLT 135*  MCV 83.7  MCH 25.2*  MCHC 30.1  RDW 17.6*   ------------------------------------------------------------------------------------------------------------------  Chemistries  Recent Labs  Lab 10/26/17 1230  NA 141  K 4.3  CL 105  CO2 25  GLUCOSE 105*  BUN 38*  CREATININE 1.34*  CALCIUM 9.2   ------------------------------------------------------------------------------------------------------------------  ------------------------------------------------------------------------------------------------------------------ GFR: CrCl cannot be calculated (Unknown ideal weight.). Liver Function Tests: No results for input(s): AST, ALT, ALKPHOS, BILITOT, PROT, ALBUMIN in the last 168 hours. No results for input(s): LIPASE, AMYLASE in the last 168 hours. No results for input(s): AMMONIA in the last 168 hours. Coagulation Profile: No results for input(s): INR, PROTIME in the last 168 hours. Cardiac Enzymes: No results for input(s): CKTOTAL, CKMB, CKMBINDEX, TROPONINI in the last 168 hours. BNP (last 3 results) Recent Labs    08/04/17 1222  PROBNP 366.0*    --------------------------------------------------------------------------------------------------------------- Urine analysis:    Component Value Date/Time   COLORURINE AMBER (A) 10/26/2016 1642   APPEARANCEUR HAZY (A) 10/26/2016  1642   LABSPEC 1.020 10/26/2016 1642   PHURINE 7.0 10/26/2016 1642   GLUCOSEU NEGATIVE 10/26/2016 1642   HGBUR NEGATIVE 10/26/2016 1642   HGBUR trace-lysed 11/08/2009 1028   BILIRUBINUR NEGATIVE 10/26/2016 1642   BILIRUBINUR n 08/25/2014 1132   KETONESUR NEGATIVE 10/26/2016 1642   PROTEINUR NEGATIVE 10/26/2016 1642   UROBILINOGEN 0.2 08/25/2014 1132   UROBILINOGEN 0.2 05/31/2014 1534   NITRITE NEGATIVE 10/26/2016 1642   LEUKOCYTESUR TRACE (A) 10/26/2016 1642      Imaging Results:    Dg Chest 2 View  Result Date: 10/26/2017 CLINICAL DATA:  Chest pain and increased SOB x 4 days. Hx COPD, PNA, heart stents, stroke. EXAM: CHEST - 2 VIEW COMPARISON:  Chest x-ray dated 07/31/2017. FINDINGS: Stable cardiomegaly. Lungs are clear. No pleural effusion or pneumothorax seen. Stable elevation/eventration of the RIGHT hemidiaphragm. No acute or suspicious osseous finding. IMPRESSION: No active cardiopulmonary disease. No evidence of pneumonia or pulmonary edema. Stable cardiomegaly. Electronically Signed   By: Franki Cabot M.D.   On: 10/26/2017 13:19   Ct Angio Chest Pe W/cm &/or Wo Cm  Result Date: 10/26/2017 CLINICAL DATA:  Short of breath bilateral lower limb swelling cough history of DVT  EXAM: CT ANGIOGRAPHY CHEST WITH CONTRAST TECHNIQUE: Multidetector CT imaging of the chest was performed using the standard protocol during bolus administration of intravenous contrast. Multiplanar CT image reconstructions and MIPs were obtained to evaluate the vascular anatomy. CONTRAST:  129m ISOVUE-370 IOPAMIDOL (ISOVUE-370) INJECTION 76% COMPARISON:  Chest x-ray 10/26/2017, CT chest 07/22/2017, 09/12/2016 FINDINGS: Cardiovascular: Satisfactory opacification of the pulmonary arteries to the segmental level. No evidence of pulmonary embolism. Moderate aortic atherosclerosis. No aneurysmal dilatation. Coronary vascular calcification. Mild cardiomegaly. No pericardial effusion. Mediastinum/Nodes: Midline trachea. No  thyroid mass. Stable 11 mm low right paratracheal lymph nodes. Esophagus within normal limits Lungs/Pleura: Bilateral mosaic pattern, more prominent compared to prior with right greater than left ground-glass densities and nodularity, increased compared to the prior chest CT. No pleural effusion or pneumothorax. Upper Abdomen: Reflux of contrast into the hepatic veins. Surgical clips in the right upper quadrant. Musculoskeletal: Degenerative changes. No acute or suspicious abnormality. Review of the MIP images confirms the above findings. IMPRESSION: 1. Negative for acute pulmonary embolus. 2. Right greater than left ground-glass density with increased nodularity in the right upper, middle and lower lobes, suspicious for pulmonary infection, possible atypical pneumonia. Short interval CT follow-up recommended following antibiotic trial and ensure clearing. Persistent mediastinal adenopathy, possibly reactive and not significantly changed since January 2019. Aortic Atherosclerosis (ICD10-I70.0). Electronically Signed   By: KDonavan FoilM.D.   On: 10/26/2017 21:03    My personal review of EKG: Rhythm-atrial fibrillation   Assessment & Plan:    Active Problems:   Essential hypertension   Coronary atherosclerosis   Persistent atrial fibrillation (HCC)   Chronic anticoagulation   CAP (community acquired pneumonia)   1. Community-acquired pneumonia-CTA chest shows atypical pneumonia, will start patient on ceftriaxone and Zithromax.  Will obtain blood cultures x 2.  Xopenex, ipratropium every 6 hours. 2. Acute on chronic diastolic CHF-patient does have worsening leg swelling bilaterally, will start Lasix 20 mg IV every 12 hours.  Follow BMP in a.m.  Will monitor patient on telemetry. 3. Chronic atrial fibrillation-heart rate is controlled, continue Cardizem, metoprolol.  Continue Eliquis for anticoagulation 4. Hypertension- continue metoprolol, Cardizem.  Will hold Cozaar at this time. 5. CAD-patient  has history of bare-metal stent to the in 2009,RCA she does complain of intermittent chest pain.  Has nonspecific ST changes on EKG.  Will cycle troponin every 6 hours x3. 6. History of colon cancer-status post hemicolectomy. 7. Chronic anemia-FOBT is positive, patient's hemoglobin is stable at 9.6, previous hemoglobin in January was 11.7 ,follow CBC in a.m.  If hemoglobin drops further consider GI evaluation 8.    DVT Prophylaxis-   Eliquis  AM Labs Ordered, also please review Full Orders  Family Communication: Admission, patients condition and plan of care including tests being ordered have been discussed with the patient and her daughter at bedside who indicate understanding and agree with the plan and Code Status.  Code Status:  Full code  Admission status: Inpatient    Time spent in minutes : 60 min   GOswald HillockM.D on 10/26/2017 at 9:59 PM  Between 7am to 7pm - Pager - [phone]. After 7pm go to [email or web address] - password [email or web address] - Office  [phone]

## 2017-10-26 NOTE — ED Notes (Signed)
EDP at bedside  

## 2017-10-26 NOTE — ED Notes (Addendum)
Pt given pillow. Daughter [name] requesting she be called with any pertinent updates and would like to be contacted as soon as patient has a bed assigned. Use mobile number as listed in snapshot [phone]). Phone number also left on patient's white board

## 2017-10-27 ENCOUNTER — Inpatient Hospital Stay (HOSPITAL_COMMUNITY): Payer: PPO

## 2017-10-27 DIAGNOSIS — D649 Anemia, unspecified: Secondary | ICD-10-CM

## 2017-10-27 DIAGNOSIS — R195 Other fecal abnormalities: Secondary | ICD-10-CM

## 2017-10-27 DIAGNOSIS — R112 Nausea with vomiting, unspecified: Secondary | ICD-10-CM

## 2017-10-27 DIAGNOSIS — I5033 Acute on chronic diastolic (congestive) heart failure: Secondary | ICD-10-CM

## 2017-10-27 DIAGNOSIS — R9439 Abnormal result of other cardiovascular function study: Secondary | ICD-10-CM

## 2017-10-27 DIAGNOSIS — I340 Nonrheumatic mitral (valve) insufficiency: Secondary | ICD-10-CM | POA: Diagnosis present

## 2017-10-27 DIAGNOSIS — K921 Melena: Secondary | ICD-10-CM

## 2017-10-27 DIAGNOSIS — I248 Other forms of acute ischemic heart disease: Secondary | ICD-10-CM

## 2017-10-27 DIAGNOSIS — J189 Pneumonia, unspecified organism: Secondary | ICD-10-CM

## 2017-10-27 DIAGNOSIS — I200 Unstable angina: Secondary | ICD-10-CM

## 2017-10-27 DIAGNOSIS — D696 Thrombocytopenia, unspecified: Secondary | ICD-10-CM | POA: Diagnosis present

## 2017-10-27 LAB — CBC
HCT: 28.1 % — ABNORMAL LOW (ref 36.0–46.0)
HCT: 29.9 % — ABNORMAL LOW (ref 36.0–46.0)
Hemoglobin: 8.3 g/dL — ABNORMAL LOW (ref 12.0–15.0)
Hemoglobin: 8.8 g/dL — ABNORMAL LOW (ref 12.0–15.0)
MCH: 25.1 pg — ABNORMAL LOW (ref 26.0–34.0)
MCH: 25.1 pg — ABNORMAL LOW (ref 26.0–34.0)
MCHC: 29.5 g/dL — ABNORMAL LOW (ref 30.0–36.0)
MCHC: 29.4 g/dL — ABNORMAL LOW (ref 30.0–36.0)
MCV: 84.9 fL (ref 78.0–100.0)
MCV: 85.2 fL (ref 78.0–100.0)
Platelets: 131 10*3/uL — ABNORMAL LOW (ref 150–400)
Platelets: 143 10*3/uL — ABNORMAL LOW (ref 150–400)
RBC: 3.31 MIL/uL — ABNORMAL LOW (ref 3.87–5.11)
RBC: 3.51 MIL/uL — ABNORMAL LOW (ref 3.87–5.11)
RDW: 18.1 % — ABNORMAL HIGH (ref 11.5–15.5)
RDW: 18.4 % — ABNORMAL HIGH (ref 11.5–15.5)
WBC: 7.1 10*3/uL (ref 4.0–10.5)
WBC: 10.8 10*3/uL — ABNORMAL HIGH (ref 4.0–10.5)

## 2017-10-27 LAB — IRON AND TIBC
Iron: 18 ug/dL — ABNORMAL LOW (ref 28–170)
Saturation Ratios: 5 % — ABNORMAL LOW (ref 10.4–31.8)
TIBC: 386 ug/dL (ref 250–450)
UIBC: 368 ug/dL

## 2017-10-27 LAB — COMPREHENSIVE METABOLIC PANEL
ALT: 19 U/L (ref 14–54)
AST: 31 U/L (ref 15–41)
Albumin: 3.1 g/dL — ABNORMAL LOW (ref 3.5–5.0)
Alkaline Phosphatase: 42 U/L (ref 38–126)
Anion gap: 11 (ref 5–15)
BUN: 31 mg/dL — ABNORMAL HIGH (ref 6–20)
CO2: 26 mmol/L (ref 22–32)
Calcium: 8.3 mg/dL — ABNORMAL LOW (ref 8.9–10.3)
Chloride: 103 mmol/L (ref 101–111)
Creatinine, Ser: 1.26 mg/dL — ABNORMAL HIGH (ref 0.44–1.00)
GFR calc Af Amer: 44 mL/min — ABNORMAL LOW (ref >60)
GFR calc non Af Amer: 38 mL/min — ABNORMAL LOW (ref >60)
Glucose, Bld: 174 mg/dL — ABNORMAL HIGH (ref 65–99)
Potassium: 3.6 mmol/L (ref 3.5–5.1)
Sodium: 140 mmol/L (ref 135–145)
Total Bilirubin: 0.7 mg/dL (ref 0.3–1.2)
Total Protein: 6.2 g/dL — ABNORMAL LOW (ref 6.5–8.1)

## 2017-10-27 LAB — TROPONIN I
Troponin I: <0.03 ng/mL (ref <0.03)
Troponin I: <0.03 ng/mL (ref <0.03)
Troponin I: <0.03 ng/mL (ref <0.03)

## 2017-10-27 LAB — RETICULOCYTES
RBC.: 3.51 MIL/uL — ABNORMAL LOW (ref 3.87–5.11)
Retic Count, Absolute: 94.8 10*3/uL (ref 19.0–186.0)
Retic Ct Pct: 2.7 % (ref 0.4–3.1)

## 2017-10-27 LAB — LACTATE DEHYDROGENASE: LDH: 202 U/L — ABNORMAL HIGH (ref 98–192)

## 2017-10-27 LAB — FERRITIN: Ferritin: 75 ng/mL (ref 11–307)

## 2017-10-27 LAB — MAGNESIUM: Magnesium: 1.9 mg/dL (ref 1.7–2.4)

## 2017-10-27 LAB — VITAMIN B12: Vitamin B-12: 510 pg/mL (ref 180–914)

## 2017-10-27 MED ORDER — IPRATROPIUM BROMIDE 0.02 % IN SOLN: 0.5000 mg | Freq: Four times a day (QID) | RESPIRATORY_TRACT | Status: DC | PRN

## 2017-10-27 MED ORDER — MAGNESIUM SULFATE 2 GM/50ML IV SOLN
2.0000 g | Freq: Once | INTRAVENOUS | Status: AC
  Administered 2017-10-27: 2 g via INTRAVENOUS
  Filled 2017-10-27: qty 50

## 2017-10-27 MED ORDER — POTASSIUM CHLORIDE CRYS ER 20 MEQ PO TBCR
40.0000 meq | EXTENDED_RELEASE_TABLET | Freq: Once | ORAL | Status: AC
  Administered 2017-10-27: 40 meq via ORAL
  Filled 2017-10-27: qty 2

## 2017-10-27 MED ORDER — IOPAMIDOL (ISOVUE-300) INJECTION 61%
100.0000 mL | Freq: Once | INTRAVENOUS | Status: AC | PRN
  Administered 2017-10-27: 100 mL via INTRAVENOUS

## 2017-10-27 MED ORDER — LEVALBUTEROL HCL 0.63 MG/3ML IN NEBU: 0.6300 mg | INHALATION_SOLUTION | Freq: Four times a day (QID) | RESPIRATORY_TRACT | Status: DC | PRN

## 2017-10-27 MED ORDER — DOCUSATE SODIUM 100 MG PO CAPS
100.0000 mg | ORAL_CAPSULE | Freq: Two times a day (BID) | ORAL | Status: DC
  Administered 2017-10-27 – 2017-10-30 (×6): 100 mg via ORAL
  Filled 2017-10-27 (×7): qty 1

## 2017-10-27 MED ORDER — PANTOPRAZOLE SODIUM 40 MG IV SOLR
40.0000 mg | Freq: Two times a day (BID) | INTRAVENOUS | Status: DC
  Administered 2017-10-27 – 2017-10-28 (×2): 40 mg via INTRAVENOUS
  Filled 2017-10-27 (×2): qty 40

## 2017-10-27 MED ORDER — POLYETHYLENE GLYCOL 3350 17 G PO PACK
17.0000 g | PACK | Freq: Every day | ORAL | Status: DC
  Administered 2017-10-27 – 2017-10-29 (×2): 17 g via ORAL
  Filled 2017-10-27 (×4): qty 1

## 2017-10-27 MED ORDER — TRAMADOL HCL 50 MG PO TABS
25.0000 mg | ORAL_TABLET | Freq: Two times a day (BID) | ORAL | Status: DC | PRN
  Administered 2017-10-27: 25 mg via ORAL
  Filled 2017-10-27: qty 1

## 2017-10-27 MED ORDER — FUROSEMIDE 10 MG/ML IJ SOLN: 20.0000 mg | Freq: Every day | INTRAMUSCULAR | Status: DC

## 2017-10-27 MED ORDER — FUROSEMIDE 10 MG/ML IJ SOLN
20.0000 mg | Freq: Two times a day (BID) | INTRAMUSCULAR | Status: DC
  Administered 2017-10-27 – 2017-10-31 (×8): 20 mg via INTRAVENOUS
  Filled 2017-10-27 (×8): qty 2

## 2017-10-27 MED ORDER — FERROUS SULFATE 325 (65 FE) MG PO TABS
325.0000 mg | ORAL_TABLET | Freq: Three times a day (TID) | ORAL | Status: DC
  Administered 2017-10-27 – 2017-10-29 (×5): 325 mg via ORAL
  Filled 2017-10-27 (×6): qty 1

## 2017-10-27 NOTE — Progress Notes (Signed)
[facility] Progress Note  Patient: [name] HBZ:[id number]   PCP: Marletta Lor, MD DOB: [birth year]   DOA: 10/26/2017   DOS: 10/27/2017   Date of Service: the patient was seen and examined on 10/27/2017  Subjective: Feels fatigued and wiped out, no chest pain right now, no abdominal pain.  No fever no chills.  Complains about persistent nausea since admission without any vomiting.  Had vomiting at home, and a few of them were coffee color.  No blood in the stool.  No melena.  Ongoing orthopnea as well as PND.  No fever in the hospital but felt feverish at home, she only felt the symptoms at night.  Cough with brownish sputum. No blood.  Brief hospital course: Pt. with PMH of  CAD, diastolic CHF, hypertension, chronic atrial fibrillation on anticoagulation with Eliquis, GERD, hyperlipidemia; admitted on 10/26/2017, presented with complaint of fatigue, was found to have acute on chronic diastolic CHF and intractable nausea and vomiting Currently further plan is cardiology consult, further workup for intractable nausea and vomiting as well as positive Hemoccult.  Assessment and Plan: Principal Problem:   Acute on chronic diastolic CHF (congestive heart failure) (HCC)   Abnormal stress test   Moderate mitral regurgitation   Coronary artery disease of native artery of native heart with stable angina pectoris ([place])   Essential hypertension   Acute hypoxic respiratory failure Presents with complaints of shortness of breath although reported as ongoing since last 1 week but per patient progressively worsening over last few months. On 2 L of oxygen Patient echocardiogram February 2019 shows EF of 55-60%, moderate MR. Recent outpatient nuclear stress test is intermediate risk as well with ischemia in mid anterior, apical area. Currently chest pain-free, troponins negative. CT chest PE protocol on admission is negative for pulmonary embolism. Shows groundglass density which appears more  likely CHF rather than atypical pneumonia to me. Patient denies any significant signs of infection at home, no leukocytosis here. Started on IV Lasix, will continue it for now. Cardiology consult for further input. Currently on Cardizem, metoprolol and Lasix.    Chronic atrial fibrillation (HCC)   Chronic anticoagulation Continue metoprolol and Cardizem. Hold Eliquis in the setting of possible GI bleed.    GERD   Lethargy ?  Hematemesis, iron deficiency anemia Primary complaint is fatigue for patient. Has history of GERD. On PPI. Hemoglobin dropped from 11-8. Patient reported coffee colored emesis. No blood noted here although Hemoccult positive. Has a history of colon cancer causing iron deficiency anemia. Iron level low, will replace. Transfuse for hemoglobin less than 7. GI consulted. IV PPI twice a day on clear liquid diet for now.    Cancer of ascending colon (HCC)   Nausea and vomiting Patient has significant abdominal pain on left lower quadrant as well as ongoing nausea as well as multivessel episode of vomiting at home. With a history of colon cancer I get CT of the abdomen to rule out any acute abnormality.    CAP (community acquired pneumonia) Presented with shortness of breath, get a CT chest which was reported as a possible atypical pneumonia. Although it appears that patient has more CHF presentation rather than pneumonia presentation. Currently on IV antibiotics. If the blood cultures are negative for 24 hours will discontinue antibiotics.    Thrombocytopenia (HCC)    Iron deficiency anemia We will start the patient on iron supplementation. Platelets are relatively low, will monitor.    Anxiety and depression Continue Zoloft, trazodone, Ativan, gabapentin  Diet: clear liquid diet DVT Prophylaxis: mechanical compression device (apixaban on hold)  Advance goals of care discussion: full code  Family Communication: family was present at bedside, at the  time of interview. The pt provided permission to discuss medical plan with the family. Opportunity was given to ask question and all questions were answered satisfactorily.   Disposition:  Discharge to be determined.  Consultants: cardiology, gastroenterology. Procedures: NONE  Antibiotics: Anti-infectives (From admission, onward)   Start     Dose/Rate Route Frequency Ordered Stop   10/27/17 2200  cefTRIAXone (ROCEPHIN) 1 g in sodium chloride 0.9 % 100 mL IVPB  Status:  Discontinued     1 g 200 mL/hr over 30 Minutes Intravenous Every 24 hours 10/26/17 2203 10/27/17 1135   10/27/17 1600  azithromycin (ZITHROMAX) 500 mg in sodium chloride 0.9 % 250 mL IVPB     500 mg 250 mL/hr over 60 Minutes Intravenous Every 24 hours 10/26/17 2335     10/26/17 2130  cefTRIAXone (ROCEPHIN) 1 g in sodium chloride 0.9 % 100 mL IVPB     1 g 200 mL/hr over 30 Minutes Intravenous  Once 10/26/17 2119 10/26/17 2243   10/26/17 2130  azithromycin (ZITHROMAX) tablet 500 mg  Status:  Discontinued     500 mg Oral  Once 10/26/17 2119 10/26/17 2119   10/26/17 2130  azithromycin (ZITHROMAX) 500 mg in sodium chloride 0.9 % 250 mL IVPB     500 mg 250 mL/hr over 60 Minutes Intravenous  Once 10/26/17 2119 10/26/17 2254       Objective: Physical Exam: Vitals:   10/27/17 0700 10/27/17 1104 10/27/17 1244 10/27/17 1420  BP:  (!) 106/50 (!) 100/48 (!) 95/48  Pulse:  68 68   Resp:   18   Temp:   97.8 F (36.6 C)   TempSrc:   Oral   SpO2: 97%  99%   Weight:      Height:        Intake/Output Summary (Last 24 hours) at 10/27/2017 1453 Last data filed at 10/27/2017 1414 Gross per 24 hour  Intake 805.5 ml  Output -  Net 805.5 ml   Filed Weights   10/26/17 2335 10/27/17 0615  Weight: 85.7 kg (189 lb) 85.1 kg (187 lb 11.2 oz)   General: Alert, Awake and Oriented to Time, Place and Person. Appear in marked distress, affect appropriate Eyes: PERRL, Conjunctiva normal ENT: Oral Mucosa clear moisty. Neck: difficult  to assess JVD, no Abnormal Mass Or lumps Cardiovascular: S1 and S2 Present, aortic systolic Murmur, Peripheral Pulses Present Respiratory: normal respiratory effort, Bilateral Air entry equal and Decreased, no use of accessory muscle, Clear to Auscultation, no Crackles, no wheezes Abdomen: Bowel Sound present, Soft and mild LUQ tenderness, no hernia Skin: no redness, no Rash, no induration Extremities: bilateral Pedal edema, no calf tenderness Neurologic: Grossly no focal neuro deficit. Bilaterally Equal motor strength  Data Reviewed: CBC: Recent Labs  Lab 10/26/17 1230 10/27/17 0610 10/27/17 1136  WBC 9.9 7.1 10.8*  HGB 9.6* 8.3* 8.8*  HCT 31.9* 28.1* 29.9*  MCV 83.7 84.9 85.2  PLT 135* 131* 329*   Basic Metabolic Panel: Recent Labs  Lab 10/26/17 1230 10/27/17 0610  NA 141 140  K 4.3 3.6  CL 105 103  CO2 25 26  GLUCOSE 105* 174*  BUN 38* 31*  CREATININE 1.34* 1.26*  CALCIUM 9.2 8.3*  MG  --  1.9    Liver Function Tests: Recent Labs  Lab 10/27/17 0610  AST  31  ALT 19  ALKPHOS 42  BILITOT 0.7  PROT 6.2*  ALBUMIN 3.1*   No results for input(s): LIPASE, AMYLASE in the last 168 hours. No results for input(s): AMMONIA in the last 168 hours. Coagulation Profile: No results for input(s): INR, PROTIME in the last 168 hours. Cardiac Enzymes: Recent Labs  Lab 10/27/17 0048 10/27/17 0610 10/27/17 1136  TROPONINI <0.03 <0.03 <0.03   BNP (last 3 results) Recent Labs    08/04/17 1222  PROBNP 366.0*   CBG: No results for input(s): GLUCAP in the last 168 hours. Studies: Ct Angio Chest Pe W/cm &/or Wo Cm  Result Date: 10/26/2017 CLINICAL DATA:  Short of breath bilateral lower limb swelling cough history of DVT EXAM: CT ANGIOGRAPHY CHEST WITH CONTRAST TECHNIQUE: Multidetector CT imaging of the chest was performed using the standard protocol during bolus administration of intravenous contrast. Multiplanar CT image reconstructions and MIPs were obtained to evaluate  the vascular anatomy. CONTRAST:  130mL ISOVUE-370 IOPAMIDOL (ISOVUE-370) INJECTION 76% COMPARISON:  Chest x-ray 10/26/2017, CT chest 07/22/2017, 09/12/2016 FINDINGS: Cardiovascular: Satisfactory opacification of the pulmonary arteries to the segmental level. No evidence of pulmonary embolism. Moderate aortic atherosclerosis. No aneurysmal dilatation. Coronary vascular calcification. Mild cardiomegaly. No pericardial effusion. Mediastinum/Nodes: Midline trachea. No thyroid mass. Stable 11 mm low right paratracheal lymph nodes. Esophagus within normal limits Lungs/Pleura: Bilateral mosaic pattern, more prominent compared to prior with right greater than left ground-glass densities and nodularity, increased compared to the prior chest CT. No pleural effusion or pneumothorax. Upper Abdomen: Reflux of contrast into the hepatic veins. Surgical clips in the right upper quadrant. Musculoskeletal: Degenerative changes. No acute or suspicious abnormality. Review of the MIP images confirms the above findings. IMPRESSION: 1. Negative for acute pulmonary embolus. 2. Right greater than left ground-glass density with increased nodularity in the right upper, middle and lower lobes, suspicious for pulmonary infection, possible atypical pneumonia. Short interval CT follow-up recommended following antibiotic trial and ensure clearing. Persistent mediastinal adenopathy, possibly reactive and not significantly changed since January 2019. Aortic Atherosclerosis (ICD10-I70.0). Electronically Signed   By: Donavan Foil M.D.   On: 10/26/2017 21:03    Scheduled Meds: . diltiazem  30 mg Oral Q8H  . docusate sodium  100 mg Oral BID  . ferrous sulfate  325 mg Oral TID WC  . [START ON 10/28/2017] furosemide  20 mg Intravenous Daily  . gabapentin  600 mg Oral BID  . ipratropium  0.5 mg Nebulization Q6H  . levalbuterol  0.63 mg Nebulization Q6H  . metoprolol tartrate  100 mg Oral BID  . pantoprazole (PROTONIX) IV  40 mg Intravenous Q12H    . polyethylene glycol  17 g Oral Daily  . sertraline  50 mg Oral Daily  . traZODone  50 mg Oral QHS  . vitamin C  1,000 mg Oral Daily   Continuous Infusions: . azithromycin     PRN Meds: HYDROcodone-acetaminophen, LORazepam, ondansetron **OR** ondansetron (ZOFRAN) IV  Time spent: 35 minutes  Author: Berle Mull, MD [facility] Pager: [phone] 10/27/2017 2:53 PM  If 7PM-7AM, please contact night-coverage at [email or web address], password [facility]

## 2017-10-27 NOTE — H&P (View-Only) (Signed)
Referring Provider: [facility]   Primary Care Physician:  Marletta Lor, MD Primary Gastroenterologist:   Scarlette Shorts,   MD  Reason for Consultation:   Anemia and heme +stools    ASSESSMENT AND PLAN:    71. 84 yo female with multiple medical problems not limited to acute on chronic CHF, AFib, and CAP admitted with SOB, heme + stool, worsening of chronic nausea / vomiting, ? Low volume hematemesis and recurrent anemia with 3 gram drop in hgb (on Eliquis) from mid 11 to mid 8 since late January. She has been taking Aleve a few times a week.  -Unremarkable endoscopic workup in June 2018 for IDA as well as N/V and epigastric pain though I don't know that she was taking Aleve at that time. She says the N/V is worse than ever and with NSAID use and possible hematemesis it isn't unreasonable to repeat an EGD.  -She had a moderate hiatal hernia on EGD in June, doubt it has enlarged but will evaluate at time of EGD -If remains stable overnight from pulmonary standpoint then can proceed with EGD tomorrow. The risks and benefits of EGD were discussed and the patient agrees to proceed.  -continue BID IV PPI, change to PO when no longer vomiting.   2. CAP, likely contributing to SOB on admission. No respiratory distress at present. On antibiotics.   3. Mild thrombocytopenia, chronic. Platelets stable at 130.   4. Hx of colon cancer 2015, s/p right hemicolectomy. Follow up colonoscopy June 2018 - no recurrent cancers, + adenomas without HGD    HPI: [name] is a 84 y.o. female with acute on chronic diastolic CHF, CAD, chronic AFib on anticoagulation.She has a hx of right sided colon cancer, s/p right hemicolectomy 2015.   Patient admitted with SOB, nausea and intermittent vomiting. Admitted with CAP, acute on chronic diastolic heart failure but found to be anemic with heme positive stools.  Baseline hgb mid 11, down to mid 8 now. She takes Aleve at night, a few times a week. She  doesn't pay too much attention to BMs but does know that stools have been darker than normal lately. She has had nausea and vomiting for a "long while" . Sometimes emesis is dark brown. She can't be more specific but feels it has been going on for months. She hasn't had any associated weight loss. She takes Nexium every day which typically controls GERD sx. She endorses LUQ and RLQ pain unrelated to eating. No BM since admission but typically no problems with her bowels.   GI Procedures:  EGD June 2018 for IDA, nausea, vomiting and epigastric pain. Findings: moderate size uncomplicated hiatal hernia.   Colonoscopy June 2018 for IDA on Eliquis. Findings: two small polyps.Both tubular adenomas without HGD   Past Medical History:  Diagnosis Date  . Adenocarcinoma, colon [facility]) Oncologist-- Dr Truitt Merle   Multifocal (2) Colon cancer @ ileocecal valve and ascending ( [id number]), Stage IIIB, grade I, MMR normal , 2 of 16 +lymph nodes, negative surgical margins---  06-02-2014  Right hemicolectomy w/ colostomy  . Allergy   . Anxiety   . Asthma    inhaler "sometimes"  . CAD (coronary artery disease)    a. LHC 4/09: pLAD 40, mDx 70-75, pLCx 30, mLCx 50, mRCA 90, EF 60% >> PCI: BMS x2 to RCA;  b. Myoview 8/12: normal  . Cataract    bil cateracts removed  . Chronic anemia   . Chronic diastolic CHF (congestive  heart failure) (HCC)    a. Echo 912 - Mild LVH, EF 55-60%, no RWMA, Gr 1 DD, mild MR, mild LAE, mild RAE, PASP 59 mmHg (mod to severe pulmo HTN)  . Clotting disorder (HCC)    hx of dvt, tia on eliquis  . Colostomy in place [facility])    s/p colostomy takedown 5/16  . Colovaginal fistula    s/p colostomy >> colostomy takedown 5/16  . COPD with chronic bronchitis ([place])   . Depression   . Dysrhythmia    A fib  . Essential hypertension   . GERD (gastroesophageal reflux disease)   . History of adenomatous polyp of colon   . History of blood transfusion   . History of DVT of lower extremity   .  History of hiatal hernia   . History of TIA (transient ischemic attack)    a. Head CT 6/15: small chronic lacunar infarct in thalamus  . Hyperlipidemia   . OA (osteoarthritis)   . Osteoporosis   . Peripheral neuropathy   . Pneumonia   . Pulmonary HTN ([place])    a. PASP on Echo in 9/12:  59 mmHg  . Renal insufficiency   . Sigmoid diverticulosis    s/p sigmoid colectomy  . Stroke ([place])    TIAs  . Wears dentures   . Wears glasses     Past Surgical History:  Procedure Laterality Date  . ABDOMINAL HYSTERECTOMY    . ANTERIOR CERVICAL DECOMP/DISCECTOMY FUSION  01-31-2009   C5 -- 7  . APPENDECTOMY    . Bone spurs Bilateral    Feet  . CARDIOVASCULAR STRESS TEST  02-26-2011   Normal lexiscan no exercise study/  no ischemia/  normal LV function and wall motion, ef 67%  . CARPAL TUNNEL RELEASE Right   . CATARACT EXTRACTION W/ INTRAOCULAR LENS  IMPLANT, BILATERAL Bilateral   . CHOLECYSTECTOMY    . COLOSTOMY N/A 06/02/2014   Procedure: DIVERTING DESCENDING END COLOSTOMY;  Surgeon: Donnie Mesa, MD;  Location: [place];  Service: General;  Laterality: N/A;  . CORONARY ANGIOPLASTY WITH STENT PLACEMENT  11-04-2007  dr bensimhon   BMS x2 to RCA/  mild Non-obstructive disease LAD/  normal LVF  . DILATION AND CURETTAGE OF UTERUS    . EVALUATION UNDER ANESTHESIA WITH ANAL FISTULECTOMY N/A 10/13/2014   Procedure: ANAL EXAM UNDER ANESTHESIA ;  Surgeon: [name], MD;  Location: [facility];  Service: General;  Laterality: N/A;  . HAND SURGERY     Tendon repair  . LAPAROSCOPIC SIGMOID COLECTOMY N/A 11/24/2014   Procedure: SIGMOID COLECTOMY AND COLSOTOMY CLOSURE;  Surgeon: Donnie Mesa, MD;  Location: [place];  Service: General;  Laterality: N/A;  . LUMBAR LAMINECTOMY  10-29-2002,  1969   Left  L3 -- 4  decompression  . PARTIAL COLECTOMY N/A 06/02/2014   Procedure: RIGHT PARTIAL COLECTOMY ;  Surgeon: Donnie Mesa, MD;  Location: [place];  Service: General;  Laterality: N/A;  . PATELLECTOMY Right  09/14/2013   Procedure: PATELLECTOMY;  Surgeon: Meredith Pel, MD;  Location: [place];  Service: Orthopedics;  Laterality: Right;  . PROCTOSCOPY N/A 10/13/2014   Procedure: RIDGE PROCTOSCOPY;  Surgeon: [name], MD;  Location: [facility];  Service: General;  Laterality: N/A;  . REVISION TOTAL KNEE ARTHROPLASTY Bilateral right  04-02-2011/  left 1996 & 10-05-1999  . ROTATOR CUFF REPAIR Bilateral   . TONSILLECTOMY    . TOTAL KNEE ARTHROPLASTY Bilateral left 1994/  right 2000  . TOTAL KNEE REVISION Left 08/02/2015  Procedure: LEFT FEMORAL REVISION;  Surgeon: Gaynelle Arabian, MD;  Location: [facility];  Service: Orthopedics;  Laterality: Left;  . TRANSTHORACIC ECHOCARDIOGRAM  12-01-2009   Grade I diastolic dysfunction/  ef 60%/  moderate MR/  mild TR    Prior to Admission medications   Medication Sig Start Date End Date Taking? Authorizing Provider  acetaminophen (TYLENOL) 325 MG tablet Take 650 mg by mouth every 6 (six) hours as needed for moderate pain.    Yes [provider]  apixaban (ELIQUIS) 5 MG TABS tablet Take 1 tablet (5 mg total) by mouth 2 (two) times daily. 10/23/16  Yes Marletta Lor, MD  Ascorbic Acid (VITAMIN C) 1000 MG tablet Take 1,000 mg by mouth daily.   Yes [provider]  benazepril (LOTENSIN) 40 MG tablet Take 40 mg by mouth daily. 10/21/17  Yes [provider]  diltiazem (CARDIZEM) 30 MG tablet Take 1 tablet (30 mg total) by mouth every 8 (eight) hours. 10/23/16  Yes Marletta Lor, MD  esomeprazole (NEXIUM) 20 MG capsule Take 1 capsule (20 mg total) by mouth daily at 12 noon. Patient taking differently: Take 20 mg by mouth daily.  10/23/16  Yes Marletta Lor, MD  ferrous sulfate 325 (65 FE) MG EC tablet Take 325 mg by mouth daily with breakfast.   Yes [provider]  furosemide (LASIX) 20 MG tablet Take 1 tablet (20 mg total) by mouth daily. 08/06/17 11/04/17 Yes Simmons, Brittainy M, PA-C  gabapentin (NEURONTIN) 600 MG  tablet Take 600 mg by mouth 2 (two) times daily.   Yes [provider]  HYDROcodone-acetaminophen (NORCO/VICODIN) 5-325 MG tablet Take 1-2 tablets by mouth every 6 (six) hours as needed for moderate pain. 08/04/17  Yes Marletta Lor, MD  LORazepam (ATIVAN) 0.5 MG tablet TAKE 1 TABLET BY MOUTH TWICE DAILY AS NEEDED FOR ANXIETY OR  SLEEP 08/04/17  Yes Marletta Lor, MD  metoprolol (LOPRESSOR) 100 MG tablet Take 1 tablet (100 mg total) by mouth 2 (two) times daily. 10/23/16  Yes Marletta Lor, MD  nitroGLYCERIN (NITROSTAT) 0.4 MG SL tablet Place 1 tablet (0.4 mg total) under the tongue every 5 (five) minutes as needed for chest pain (x 3 pills). Reported on 09/01/2015 10/23/16  Yes Marletta Lor, MD  promethazine (PHENERGAN) 12.5 MG tablet TAKE 1 TABLET (12.5 MG TOTAL) BY MOUTH EVERY 8 HOURS AS NEEDED FOR NAUSEA OR VOMITING. Patient taking differently: Take 12.5 mg by mouth every 8 (eight) hours as needed for nausea or vomiting.  10/23/16  Yes Marletta Lor, MD  sertraline (ZOLOFT) 50 MG tablet Take 1 tablet (50 mg total) by mouth daily. 10/23/16  Yes Marletta Lor, MD  traZODone (DESYREL) 100 MG tablet Take 0.5 tablets (50 mg total) by mouth at bedtime. 10/23/16  Yes Marletta Lor, MD  triamcinolone cream (KENALOG) 0.1 % Apply 1 application topically 2 (two) times daily as needed (Eczema on legs). 10/23/16  Yes Marletta Lor, MD    Current Facility-Administered Medications  Medication Dose Route Frequency Provider Last Rate Last Dose  . azithromycin (ZITHROMAX) 500 mg in sodium chloride 0.9 % 250 mL IVPB  500 mg Intravenous Q24H [place], Gagan S, MD      . diltiazem (CARDIZEM) tablet 30 mg  30 mg Oral Q8H Darrick Meigs, Marge Duncans, MD   30 mg at 10/27/17 1420  . docusate sodium (COLACE) capsule 100 mg  100 mg Oral BID Lavina Hamman, MD   100 mg  at 10/27/17 1227  . ferrous sulfate tablet 325 mg  325 mg Oral TID WC Lavina Hamman, MD      . Derrill Memo ON  10/28/2017] furosemide (LASIX) injection 20 mg  20 mg Intravenous Daily Lavina Hamman, MD      . gabapentin (NEURONTIN) tablet 600 mg  600 mg Oral BID Oswald Hillock, MD   600 mg at 10/27/17 1108  . HYDROcodone-acetaminophen (NORCO/VICODIN) 5-325 MG per tablet 1-2 tablet  1-2 tablet Oral Q6H PRN Oswald Hillock, MD   1 tablet at 10/27/17 1228  . ipratropium (ATROVENT) nebulizer solution 0.5 mg  0.5 mg Nebulization Q6H Oswald Hillock, MD   0.5 mg at 10/27/17 0814  . levalbuterol (XOPENEX) nebulizer solution 0.63 mg  0.63 mg Nebulization Q6H Oswald Hillock, MD   0.63 mg at 10/27/17 0814  . LORazepam (ATIVAN) tablet 0.5 mg  0.5 mg Oral BID PRN Oswald Hillock, MD      . metoprolol tartrate (LOPRESSOR) tablet 100 mg  100 mg Oral BID Oswald Hillock, MD   100 mg at 10/27/17 1106  . ondansetron (ZOFRAN) tablet 4 mg  4 mg Oral Q6H PRN Oswald Hillock, MD       Or  . ondansetron [facility]) injection 4 mg  4 mg Intravenous Q6H PRN Oswald Hillock, MD   4 mg at 10/27/17 0850  . pantoprazole (PROTONIX) injection 40 mg  40 mg Intravenous Q12H Lavina Hamman, MD      . polyethylene glycol (MIRALAX / GLYCOLAX) packet 17 g  17 g Oral Daily Lavina Hamman, MD   17 g at 10/27/17 1227  . sertraline (ZOLOFT) tablet 50 mg  50 mg Oral Daily Oswald Hillock, MD   50 mg at 10/27/17 1106  . traZODone (DESYREL) tablet 50 mg  50 mg Oral QHS Oswald Hillock, MD   50 mg at 10/27/17 0139  . vitamin C (ASCORBIC ACID) tablet 1,000 mg  1,000 mg Oral Daily Oswald Hillock, MD   1,000 mg at 10/27/17 1105    Allergies as of 10/26/2017  . (No Known Allergies)    Family History  Problem Relation Age of Onset  . Osteoarthritis Mother   . Heart failure Mother   . Colon cancer Maternal Uncle 82  . Heart Problems Father   . Cancer Sister 34       ? colon cancer   . Kidney cancer Brother 43  . Esophageal cancer Neg Hx   . Rectal cancer Neg Hx   . Stomach cancer Neg Hx   . Pancreatic cancer Neg Hx     Social History   Socioeconomic  History  . Marital status: Widowed    Spouse name: Not on file  . Number of children: Not on file  . Years of education: Not on file  . Highest education level: Not on file  Occupational History  . Not on file  Social Needs  . Financial resource strain: Not on file  . Food insecurity:    Worry: Not on file    Inability: Not on file  . Transportation needs:    Medical: Not on file    Non-medical: Not on file  Tobacco Use  . Smoking status: Former Smoker    Packs/day: 0.50    Years: 15.00    Pack years: 7.50    Types: Cigarettes    Last attempt to quit: 07/16/1967    Years since quitting: 50.3  .  Smokeless tobacco: Never Used  Substance and Sexual Activity  . Alcohol use: No    Alcohol/week: 0.0 oz  . Drug use: No  . Sexual activity: Not on file  Lifestyle  . Physical activity:    Days per week: Not on file    Minutes per session: Not on file  . Stress: Not on file  Relationships  . Social connections:    Talks on phone: Not on file    Gets together: Not on file    Attends religious service: Not on file    Active member of club or organization: Not on file    Attends meetings of clubs or organizations: Not on file    Relationship status: Not on file  . Intimate partner violence:    Fear of current or ex partner: Not on file    Emotionally abused: Not on file    Physically abused: Not on file    Forced sexual activity: Not on file  Other Topics Concern  . Not on file  Social History Narrative   Married, 2 children.  As of November 2015 her husband has been living in a nursing home for tendon after years as he suffers from multiple medical problems.   Patient does not drink alcohol nor does she smoke or chew tobacco products   Right handed   8th grade   1 cup daily    Review of Systems: All systems reviewed and negative except where noted in HPI.  Physical Exam: Vital signs in last 24 hours: Temp:  [97.3 F (36.3 C)-98.2 F (36.8 C)] 97.8 F (36.6 C) (04/15  1244) Pulse Rate:  [68-99] 68 (04/15 1244) Resp:  [13-26] 18 (04/15 1244) BP: (95-169)/(48-109) 95/48 (04/15 1420) SpO2:  [86 %-100 %] 99 % (04/15 1244) Weight:  [187 lb 11.2 oz (85.1 kg)-189 lb (85.7 kg)] 187 lb 11.2 oz (85.1 kg) (04/15 0615)   General:   Alert, well-developed,  White female in NAD Psych:  Pleasant, cooperative. Normal mood and affect. Eyes:  Pupils equal, sclera clear, no icterus.   Conjunctiva pink. Ears:  Normal auditory acuity. Nose:  No deformity, discharge,  or lesions. Neck:  Supple; no masses Lungs:  Clear throughout to auscultation.   No wheezes, crackles, or rhonchi.  Heart:  Regular rate and rhythm, + murmur,  no edema Abdomen:  Soft, non-distended, nontender, BS active, no palp mass    Rectal:  Deferred  Msk:  Symmetrical without gross deformities. . Pulses:  Normal pulses noted. Neurologic:  Alert and  oriented x4;  grossly normal neurologically. Skin:  Intact without significant lesions or rashes..   Intake/Output from previous day: 04/14 0701 - 04/15 0700 In: 367 [P.O.:200; I.V.:67; IV Piggyback:100] Out: -  Intake/Output this shift: Total I/O In: 438.5 [P.O.:300; I.V.:18.5; Other:120] Out: -   Lab Results: Recent Labs    10/26/17 1230 10/27/17 0610 10/27/17 1136  WBC 9.9 7.1 10.8*  HGB 9.6* 8.3* 8.8*  HCT 31.9* 28.1* 29.9*  PLT 135* 131* 143*   BMET Recent Labs    10/26/17 1230 10/27/17 0610  NA 141 140  K 4.3 3.6  CL 105 103  CO2 25 26  GLUCOSE 105* 174*  BUN 38* 31*  CREATININE 1.34* 1.26*  CALCIUM 9.2 8.3*   LFT Recent Labs    10/27/17 0610  PROT 6.2*  ALBUMIN 3.1*  AST 31  ALT 19  ALKPHOS 42  BILITOT 0.7   PT/INR No results for input(s): LABPROT, INR in the last  72 hours. Hepatitis Panel No results for input(s): HEPBSAG, HCVAB, HEPAIGM, HEPBIGM in the last 72 hours.    Studies/Results: Dg Chest 2 View  Result Date: 10/26/2017 CLINICAL DATA:  Chest pain and increased SOB x 4 days. Hx COPD, PNA, heart  stents, stroke. EXAM: CHEST - 2 VIEW COMPARISON:  Chest x-ray dated 07/31/2017. FINDINGS: Stable cardiomegaly. Lungs are clear. No pleural effusion or pneumothorax seen. Stable elevation/eventration of the RIGHT hemidiaphragm. No acute or suspicious osseous finding. IMPRESSION: No active cardiopulmonary disease. No evidence of pneumonia or pulmonary edema. Stable cardiomegaly. Electronically Signed   By: Franki Cabot M.D.   On: 10/26/2017 13:19   Ct Angio Chest Pe W/cm &/or Wo Cm  Result Date: 10/26/2017 CLINICAL DATA:  Short of breath bilateral lower limb swelling cough history of DVT EXAM: CT ANGIOGRAPHY CHEST WITH CONTRAST TECHNIQUE: Multidetector CT imaging of the chest was performed using the standard protocol during bolus administration of intravenous contrast. Multiplanar CT image reconstructions and MIPs were obtained to evaluate the vascular anatomy. CONTRAST:  182m ISOVUE-370 IOPAMIDOL (ISOVUE-370) INJECTION 76% COMPARISON:  Chest x-ray 10/26/2017, CT chest 07/22/2017, 09/12/2016 FINDINGS: Cardiovascular: Satisfactory opacification of the pulmonary arteries to the segmental level. No evidence of pulmonary embolism. Moderate aortic atherosclerosis. No aneurysmal dilatation. Coronary vascular calcification. Mild cardiomegaly. No pericardial effusion. Mediastinum/Nodes: Midline trachea. No thyroid mass. Stable 11 mm low right paratracheal lymph nodes. Esophagus within normal limits Lungs/Pleura: Bilateral mosaic pattern, more prominent compared to prior with right greater than left ground-glass densities and nodularity, increased compared to the prior chest CT. No pleural effusion or pneumothorax. Upper Abdomen: Reflux of contrast into the hepatic veins. Surgical clips in the right upper quadrant. Musculoskeletal: Degenerative changes. No acute or suspicious abnormality. Review of the MIP images confirms the above findings. IMPRESSION: 1. Negative for acute pulmonary embolus. 2. Right greater than left  ground-glass density with increased nodularity in the right upper, middle and lower lobes, suspicious for pulmonary infection, possible atypical pneumonia. Short interval CT follow-up recommended following antibiotic trial and ensure clearing. Persistent mediastinal adenopathy, possibly reactive and not significantly changed since January 2019. Aortic Atherosclerosis (ICD10-I70.0). Electronically Signed   By: KDonavan FoilM.D.   On: 10/26/2017 21:03     PTye Savoy NP-C @  10/27/2017, 3:43 PM  Pager number [phone]

## 2017-10-27 NOTE — Progress Notes (Signed)
Pt was wanting to get a referral to a dermatologist, for a couple of areas above her eyebrows that she was concerned about, thanks [place]

## 2017-10-27 NOTE — Care Management Note (Signed)
Case Management Note  Patient Details  Name: [name] MRN: [id number] Date of Birth: May 07, 1930  Subjective/Objective:  CAP                Action/Plan: Patient lives at home alone; supportive daughter [name] at the bedside; Outpatient Primary MD - Marletta Lor, MD has private insurance with [employer] with prescription drug coverage; pharmacy of choice is [facility]; patient stated that the cost of Eliquis was $500 because she had not met her insurance deductible at this time; CM informed patient that she could call the Eliquis medication assistance program to see if she would qualify for any assistance; she does not want to do this at this time; DME - walker at home; she also stated that she does not want any HHC at discharge; CM will continue to follow for progression of care.  Expected Discharge Date:     Possibly 10/31/2017             Expected Discharge Plan:  Home/Self Care  In-House Referral:   [facility]  Discharge planning Services  CM Consult  Status of Service:  In process, will continue to follow  Sherrilyn Rist [phone] 10/27/2017, 10:21 AM

## 2017-10-27 NOTE — Consult Note (Signed)
Referring Provider: [facility]   Primary Care Physician:  Marletta Lor, MD Primary Gastroenterologist:   Scarlette Shorts,   MD  Reason for Consultation:   Anemia and heme +stools    ASSESSMENT AND PLAN:    71. 84 yo female with multiple medical problems not limited to acute on chronic CHF, AFib, and CAP admitted with SOB, heme + stool, worsening of chronic nausea / vomiting, ? Low volume hematemesis and recurrent anemia with 3 gram drop in hgb (on Eliquis) from mid 11 to mid 8 since late January. She has been taking Aleve a few times a week.  -Unremarkable endoscopic workup in June 2018 for IDA as well as N/V and epigastric pain though I don't know that she was taking Aleve at that time. She says the N/V is worse than ever and with NSAID use and possible hematemesis it isn't unreasonable to repeat an EGD.  -She had a moderate hiatal hernia on EGD in June, doubt it has enlarged but will evaluate at time of EGD -If remains stable overnight from pulmonary standpoint then can proceed with EGD tomorrow. The risks and benefits of EGD were discussed and the patient agrees to proceed.  -continue BID IV PPI, change to PO when no longer vomiting.   2. CAP, likely contributing to SOB on admission. No respiratory distress at present. On antibiotics.   3. Mild thrombocytopenia, chronic. Platelets stable at 130.   4. Hx of colon cancer 2015, s/p right hemicolectomy. Follow up colonoscopy June 2018 - no recurrent cancers, + adenomas without HGD    HPI: [name] is a 84 y.o. female with acute on chronic diastolic CHF, CAD, chronic AFib on anticoagulation.She has a hx of right sided colon cancer, s/p right hemicolectomy 2015.   Patient admitted with SOB, nausea and intermittent vomiting. Admitted with CAP, acute on chronic diastolic heart failure but found to be anemic with heme positive stools.  Baseline hgb mid 11, down to mid 8 now. She takes Aleve at night, a few times a week. She  doesn't pay too much attention to BMs but does know that stools have been darker than normal lately. She has had nausea and vomiting for a "long while" . Sometimes emesis is dark brown. She can't be more specific but feels it has been going on for months. She hasn't had any associated weight loss. She takes Nexium every day which typically controls GERD sx. She endorses LUQ and RLQ pain unrelated to eating. No BM since admission but typically no problems with her bowels.   GI Procedures:  EGD June 2018 for IDA, nausea, vomiting and epigastric pain. Findings: moderate size uncomplicated hiatal hernia.   Colonoscopy June 2018 for IDA on Eliquis. Findings: two small polyps.Both tubular adenomas without HGD   Past Medical History:  Diagnosis Date  . Adenocarcinoma, colon [facility]) Oncologist-- Dr Truitt Merle   Multifocal (2) Colon cancer @ ileocecal valve and ascending ( [id number]), Stage IIIB, grade I, MMR normal , 2 of 16 +lymph nodes, negative surgical margins---  06-02-2014  Right hemicolectomy w/ colostomy  . Allergy   . Anxiety   . Asthma    inhaler "sometimes"  . CAD (coronary artery disease)    a. LHC 4/09: pLAD 40, mDx 70-75, pLCx 30, mLCx 50, mRCA 90, EF 60% >> PCI: BMS x2 to RCA;  b. Myoview 8/12: normal  . Cataract    bil cateracts removed  . Chronic anemia   . Chronic diastolic CHF (congestive  heart failure) (HCC)    a. Echo 912 - Mild LVH, EF 55-60%, no RWMA, Gr 1 DD, mild MR, mild LAE, mild RAE, PASP 59 mmHg (mod to severe pulmo HTN)  . Clotting disorder (HCC)    hx of dvt, tia on eliquis  . Colostomy in place [facility])    s/p colostomy takedown 5/16  . Colovaginal fistula    s/p colostomy >> colostomy takedown 5/16  . COPD with chronic bronchitis ([place])   . Depression   . Dysrhythmia    A fib  . Essential hypertension   . GERD (gastroesophageal reflux disease)   . History of adenomatous polyp of colon   . History of blood transfusion   . History of DVT of lower extremity   .  History of hiatal hernia   . History of TIA (transient ischemic attack)    a. Head CT 6/15: small chronic lacunar infarct in thalamus  . Hyperlipidemia   . OA (osteoarthritis)   . Osteoporosis   . Peripheral neuropathy   . Pneumonia   . Pulmonary HTN ([place])    a. PASP on Echo in 9/12:  59 mmHg  . Renal insufficiency   . Sigmoid diverticulosis    s/p sigmoid colectomy  . Stroke ([place])    TIAs  . Wears dentures   . Wears glasses     Past Surgical History:  Procedure Laterality Date  . ABDOMINAL HYSTERECTOMY    . ANTERIOR CERVICAL DECOMP/DISCECTOMY FUSION  01-31-2009   C5 -- 7  . APPENDECTOMY    . Bone spurs Bilateral    Feet  . CARDIOVASCULAR STRESS TEST  02-26-2011   Normal lexiscan no exercise study/  no ischemia/  normal LV function and wall motion, ef 67%  . CARPAL TUNNEL RELEASE Right   . CATARACT EXTRACTION W/ INTRAOCULAR LENS  IMPLANT, BILATERAL Bilateral   . CHOLECYSTECTOMY    . COLOSTOMY N/A 06/02/2014   Procedure: DIVERTING DESCENDING END COLOSTOMY;  Surgeon: Donnie Mesa, MD;  Location: [place];  Service: General;  Laterality: N/A;  . CORONARY ANGIOPLASTY WITH STENT PLACEMENT  11-04-2007  dr bensimhon   BMS x2 to RCA/  mild Non-obstructive disease LAD/  normal LVF  . DILATION AND CURETTAGE OF UTERUS    . EVALUATION UNDER ANESTHESIA WITH ANAL FISTULECTOMY N/A 10/13/2014   Procedure: ANAL EXAM UNDER ANESTHESIA ;  Surgeon: [name], MD;  Location: [facility];  Service: General;  Laterality: N/A;  . HAND SURGERY     Tendon repair  . LAPAROSCOPIC SIGMOID COLECTOMY N/A 11/24/2014   Procedure: SIGMOID COLECTOMY AND COLSOTOMY CLOSURE;  Surgeon: Donnie Mesa, MD;  Location: [place];  Service: General;  Laterality: N/A;  . LUMBAR LAMINECTOMY  10-29-2002,  1969   Left  L3 -- 4  decompression  . PARTIAL COLECTOMY N/A 06/02/2014   Procedure: RIGHT PARTIAL COLECTOMY ;  Surgeon: Donnie Mesa, MD;  Location: [place];  Service: General;  Laterality: N/A;  . PATELLECTOMY Right  09/14/2013   Procedure: PATELLECTOMY;  Surgeon: Meredith Pel, MD;  Location: [place];  Service: Orthopedics;  Laterality: Right;  . PROCTOSCOPY N/A 10/13/2014   Procedure: RIDGE PROCTOSCOPY;  Surgeon: [name], MD;  Location: [facility];  Service: General;  Laterality: N/A;  . REVISION TOTAL KNEE ARTHROPLASTY Bilateral right  04-02-2011/  left 1996 & 10-05-1999  . ROTATOR CUFF REPAIR Bilateral   . TONSILLECTOMY    . TOTAL KNEE ARTHROPLASTY Bilateral left 1994/  right 2000  . TOTAL KNEE REVISION Left 08/02/2015  Procedure: LEFT FEMORAL REVISION;  Surgeon: Gaynelle Arabian, MD;  Location: [facility];  Service: Orthopedics;  Laterality: Left;  . TRANSTHORACIC ECHOCARDIOGRAM  12-01-2009   Grade I diastolic dysfunction/  ef 60%/  moderate MR/  mild TR    Prior to Admission medications   Medication Sig Start Date End Date Taking? Authorizing Provider  acetaminophen (TYLENOL) 325 MG tablet Take 650 mg by mouth every 6 (six) hours as needed for moderate pain.    Yes [provider]  apixaban (ELIQUIS) 5 MG TABS tablet Take 1 tablet (5 mg total) by mouth 2 (two) times daily. 10/23/16  Yes Marletta Lor, MD  Ascorbic Acid (VITAMIN C) 1000 MG tablet Take 1,000 mg by mouth daily.   Yes [provider]  benazepril (LOTENSIN) 40 MG tablet Take 40 mg by mouth daily. 10/21/17  Yes [provider]  diltiazem (CARDIZEM) 30 MG tablet Take 1 tablet (30 mg total) by mouth every 8 (eight) hours. 10/23/16  Yes Marletta Lor, MD  esomeprazole (NEXIUM) 20 MG capsule Take 1 capsule (20 mg total) by mouth daily at 12 noon. Patient taking differently: Take 20 mg by mouth daily.  10/23/16  Yes Marletta Lor, MD  ferrous sulfate 325 (65 FE) MG EC tablet Take 325 mg by mouth daily with breakfast.   Yes [provider]  furosemide (LASIX) 20 MG tablet Take 1 tablet (20 mg total) by mouth daily. 08/06/17 11/04/17 Yes Simmons, Brittainy M, PA-C  gabapentin (NEURONTIN) 600 MG  tablet Take 600 mg by mouth 2 (two) times daily.   Yes [provider]  HYDROcodone-acetaminophen (NORCO/VICODIN) 5-325 MG tablet Take 1-2 tablets by mouth every 6 (six) hours as needed for moderate pain. 08/04/17  Yes Marletta Lor, MD  LORazepam (ATIVAN) 0.5 MG tablet TAKE 1 TABLET BY MOUTH TWICE DAILY AS NEEDED FOR ANXIETY OR  SLEEP 08/04/17  Yes Marletta Lor, MD  metoprolol (LOPRESSOR) 100 MG tablet Take 1 tablet (100 mg total) by mouth 2 (two) times daily. 10/23/16  Yes Marletta Lor, MD  nitroGLYCERIN (NITROSTAT) 0.4 MG SL tablet Place 1 tablet (0.4 mg total) under the tongue every 5 (five) minutes as needed for chest pain (x 3 pills). Reported on 09/01/2015 10/23/16  Yes Marletta Lor, MD  promethazine (PHENERGAN) 12.5 MG tablet TAKE 1 TABLET (12.5 MG TOTAL) BY MOUTH EVERY 8 HOURS AS NEEDED FOR NAUSEA OR VOMITING. Patient taking differently: Take 12.5 mg by mouth every 8 (eight) hours as needed for nausea or vomiting.  10/23/16  Yes Marletta Lor, MD  sertraline (ZOLOFT) 50 MG tablet Take 1 tablet (50 mg total) by mouth daily. 10/23/16  Yes Marletta Lor, MD  traZODone (DESYREL) 100 MG tablet Take 0.5 tablets (50 mg total) by mouth at bedtime. 10/23/16  Yes Marletta Lor, MD  triamcinolone cream (KENALOG) 0.1 % Apply 1 application topically 2 (two) times daily as needed (Eczema on legs). 10/23/16  Yes Marletta Lor, MD    Current Facility-Administered Medications  Medication Dose Route Frequency Provider Last Rate Last Dose  . azithromycin (ZITHROMAX) 500 mg in sodium chloride 0.9 % 250 mL IVPB  500 mg Intravenous Q24H [place], Gagan S, MD      . diltiazem (CARDIZEM) tablet 30 mg  30 mg Oral Q8H Darrick Meigs, Marge Duncans, MD   30 mg at 10/27/17 1420  . docusate sodium (COLACE) capsule 100 mg  100 mg Oral BID Lavina Hamman, MD   100 mg  at 10/27/17 1227  . ferrous sulfate tablet 325 mg  325 mg Oral TID WC Lavina Hamman, MD      . Derrill Memo ON  10/28/2017] furosemide (LASIX) injection 20 mg  20 mg Intravenous Daily Lavina Hamman, MD      . gabapentin (NEURONTIN) tablet 600 mg  600 mg Oral BID Oswald Hillock, MD   600 mg at 10/27/17 1108  . HYDROcodone-acetaminophen (NORCO/VICODIN) 5-325 MG per tablet 1-2 tablet  1-2 tablet Oral Q6H PRN Oswald Hillock, MD   1 tablet at 10/27/17 1228  . ipratropium (ATROVENT) nebulizer solution 0.5 mg  0.5 mg Nebulization Q6H Oswald Hillock, MD   0.5 mg at 10/27/17 0814  . levalbuterol (XOPENEX) nebulizer solution 0.63 mg  0.63 mg Nebulization Q6H Oswald Hillock, MD   0.63 mg at 10/27/17 0814  . LORazepam (ATIVAN) tablet 0.5 mg  0.5 mg Oral BID PRN Oswald Hillock, MD      . metoprolol tartrate (LOPRESSOR) tablet 100 mg  100 mg Oral BID Oswald Hillock, MD   100 mg at 10/27/17 1106  . ondansetron (ZOFRAN) tablet 4 mg  4 mg Oral Q6H PRN Oswald Hillock, MD       Or  . ondansetron [facility]) injection 4 mg  4 mg Intravenous Q6H PRN Oswald Hillock, MD   4 mg at 10/27/17 0850  . pantoprazole (PROTONIX) injection 40 mg  40 mg Intravenous Q12H Lavina Hamman, MD      . polyethylene glycol (MIRALAX / GLYCOLAX) packet 17 g  17 g Oral Daily Lavina Hamman, MD   17 g at 10/27/17 1227  . sertraline (ZOLOFT) tablet 50 mg  50 mg Oral Daily Oswald Hillock, MD   50 mg at 10/27/17 1106  . traZODone (DESYREL) tablet 50 mg  50 mg Oral QHS Oswald Hillock, MD   50 mg at 10/27/17 0139  . vitamin C (ASCORBIC ACID) tablet 1,000 mg  1,000 mg Oral Daily Oswald Hillock, MD   1,000 mg at 10/27/17 1105    Allergies as of 10/26/2017  . (No Known Allergies)    Family History  Problem Relation Age of Onset  . Osteoarthritis Mother   . Heart failure Mother   . Colon cancer Maternal Uncle 82  . Heart Problems Father   . Cancer Sister 34       ? colon cancer   . Kidney cancer Brother 43  . Esophageal cancer Neg Hx   . Rectal cancer Neg Hx   . Stomach cancer Neg Hx   . Pancreatic cancer Neg Hx     Social History   Socioeconomic  History  . Marital status: Widowed    Spouse name: Not on file  . Number of children: Not on file  . Years of education: Not on file  . Highest education level: Not on file  Occupational History  . Not on file  Social Needs  . Financial resource strain: Not on file  . Food insecurity:    Worry: Not on file    Inability: Not on file  . Transportation needs:    Medical: Not on file    Non-medical: Not on file  Tobacco Use  . Smoking status: Former Smoker    Packs/day: 0.50    Years: 15.00    Pack years: 7.50    Types: Cigarettes    Last attempt to quit: 07/16/1967    Years since quitting: 50.3  .  Smokeless tobacco: Never Used  Substance and Sexual Activity  . Alcohol use: No    Alcohol/week: 0.0 oz  . Drug use: No  . Sexual activity: Not on file  Lifestyle  . Physical activity:    Days per week: Not on file    Minutes per session: Not on file  . Stress: Not on file  Relationships  . Social connections:    Talks on phone: Not on file    Gets together: Not on file    Attends religious service: Not on file    Active member of club or organization: Not on file    Attends meetings of clubs or organizations: Not on file    Relationship status: Not on file  . Intimate partner violence:    Fear of current or ex partner: Not on file    Emotionally abused: Not on file    Physically abused: Not on file    Forced sexual activity: Not on file  Other Topics Concern  . Not on file  Social History Narrative   Married, 2 children.  As of November 2015 her husband has been living in a nursing home for tendon after years as he suffers from multiple medical problems.   Patient does not drink alcohol nor does she smoke or chew tobacco products   Right handed   8th grade   1 cup daily    Review of Systems: All systems reviewed and negative except where noted in HPI.  Physical Exam: Vital signs in last 24 hours: Temp:  [97.3 F (36.3 C)-98.2 F (36.8 C)] 97.8 F (36.6 C) (04/15  1244) Pulse Rate:  [68-99] 68 (04/15 1244) Resp:  [13-26] 18 (04/15 1244) BP: (95-169)/(48-109) 95/48 (04/15 1420) SpO2:  [86 %-100 %] 99 % (04/15 1244) Weight:  [187 lb 11.2 oz (85.1 kg)-189 lb (85.7 kg)] 187 lb 11.2 oz (85.1 kg) (04/15 0615)   General:   Alert, well-developed,  White female in NAD Psych:  Pleasant, cooperative. Normal mood and affect. Eyes:  Pupils equal, sclera clear, no icterus.   Conjunctiva pink. Ears:  Normal auditory acuity. Nose:  No deformity, discharge,  or lesions. Neck:  Supple; no masses Lungs:  Clear throughout to auscultation.   No wheezes, crackles, or rhonchi.  Heart:  Regular rate and rhythm, + murmur,  no edema Abdomen:  Soft, non-distended, nontender, BS active, no palp mass    Rectal:  Deferred  Msk:  Symmetrical without gross deformities. . Pulses:  Normal pulses noted. Neurologic:  Alert and  oriented x4;  grossly normal neurologically. Skin:  Intact without significant lesions or rashes..   Intake/Output from previous day: 04/14 0701 - 04/15 0700 In: 367 [P.O.:200; I.V.:67; IV Piggyback:100] Out: -  Intake/Output this shift: Total I/O In: 438.5 [P.O.:300; I.V.:18.5; Other:120] Out: -   Lab Results: Recent Labs    10/26/17 1230 10/27/17 0610 10/27/17 1136  WBC 9.9 7.1 10.8*  HGB 9.6* 8.3* 8.8*  HCT 31.9* 28.1* 29.9*  PLT 135* 131* 143*   BMET Recent Labs    10/26/17 1230 10/27/17 0610  NA 141 140  K 4.3 3.6  CL 105 103  CO2 25 26  GLUCOSE 105* 174*  BUN 38* 31*  CREATININE 1.34* 1.26*  CALCIUM 9.2 8.3*   LFT Recent Labs    10/27/17 0610  PROT 6.2*  ALBUMIN 3.1*  AST 31  ALT 19  ALKPHOS 42  BILITOT 0.7   PT/INR No results for input(s): LABPROT, INR in the last  72 hours. Hepatitis Panel No results for input(s): HEPBSAG, HCVAB, HEPAIGM, HEPBIGM in the last 72 hours.    Studies/Results: Dg Chest 2 View  Result Date: 10/26/2017 CLINICAL DATA:  Chest pain and increased SOB x 4 days. Hx COPD, PNA, heart  stents, stroke. EXAM: CHEST - 2 VIEW COMPARISON:  Chest x-ray dated 07/31/2017. FINDINGS: Stable cardiomegaly. Lungs are clear. No pleural effusion or pneumothorax seen. Stable elevation/eventration of the RIGHT hemidiaphragm. No acute or suspicious osseous finding. IMPRESSION: No active cardiopulmonary disease. No evidence of pneumonia or pulmonary edema. Stable cardiomegaly. Electronically Signed   By: Franki Cabot M.D.   On: 10/26/2017 13:19   Ct Angio Chest Pe W/cm &/or Wo Cm  Result Date: 10/26/2017 CLINICAL DATA:  Short of breath bilateral lower limb swelling cough history of DVT EXAM: CT ANGIOGRAPHY CHEST WITH CONTRAST TECHNIQUE: Multidetector CT imaging of the chest was performed using the standard protocol during bolus administration of intravenous contrast. Multiplanar CT image reconstructions and MIPs were obtained to evaluate the vascular anatomy. CONTRAST:  182m ISOVUE-370 IOPAMIDOL (ISOVUE-370) INJECTION 76% COMPARISON:  Chest x-ray 10/26/2017, CT chest 07/22/2017, 09/12/2016 FINDINGS: Cardiovascular: Satisfactory opacification of the pulmonary arteries to the segmental level. No evidence of pulmonary embolism. Moderate aortic atherosclerosis. No aneurysmal dilatation. Coronary vascular calcification. Mild cardiomegaly. No pericardial effusion. Mediastinum/Nodes: Midline trachea. No thyroid mass. Stable 11 mm low right paratracheal lymph nodes. Esophagus within normal limits Lungs/Pleura: Bilateral mosaic pattern, more prominent compared to prior with right greater than left ground-glass densities and nodularity, increased compared to the prior chest CT. No pleural effusion or pneumothorax. Upper Abdomen: Reflux of contrast into the hepatic veins. Surgical clips in the right upper quadrant. Musculoskeletal: Degenerative changes. No acute or suspicious abnormality. Review of the MIP images confirms the above findings. IMPRESSION: 1. Negative for acute pulmonary embolus. 2. Right greater than left  ground-glass density with increased nodularity in the right upper, middle and lower lobes, suspicious for pulmonary infection, possible atypical pneumonia. Short interval CT follow-up recommended following antibiotic trial and ensure clearing. Persistent mediastinal adenopathy, possibly reactive and not significantly changed since January 2019. Aortic Atherosclerosis (ICD10-I70.0). Electronically Signed   By: KDonavan FoilM.D.   On: 10/26/2017 21:03     PTye Savoy NP-C @  10/27/2017, 3:43 PM  Pager number [phone]

## 2017-10-27 NOTE — Consult Note (Addendum)
Cardiology Consultation:   Patient ID: [name]; [id number]; 1930/01/28   Admit date: 10/26/2017 Date of Consult: 10/27/2017  Primary Care Provider: Marletta Lor, MD Primary Cardiologist: Mertie Moores, MD   Patient Profile:   [name] is a 84 y.o. female with a hx of CAD s/p BMS to RCA x 2 in 4098, diastolic CHF, mitral regurgitation, HTN, HLD, persistent afib on Eliquis, chronic dyspnea on exertion, COPD, pulmonary HTN, prior TIA, prior DVT and h/o colon CA who was admitted overnight for CAP and acute on chronic diastolic HF, also found to be anemic and + FOBT. She is being seen today for the evaluation of chest pain,  at the request of Dr. Posey Pronto, Internal Medicine.  History of Present Illness:   [name] a 84y.o.female, followed by Dr. Marinda Elk history of CAD s/p BMS to RCA x 2 in 1191, diastolic CHF, mitral regurgitation, HTN, HLD, persistent afib on Eliquis, chronic dyspnea on exertion, COPD, pulmonary HTN, prior TIA, prior DVT and h/o colon CA.  In January, pt was seen in clinic for worsening dyspnea and volume overload. She was referred by her PCP, Dr. Burnice Logan, who had seen her a few days prior and instructed her to increase her Lasix from 3 days a week to daily. BNP at that time was 366. She noted some improvement in symptoms after increasing lasix but still had mild dyspnea thus additional studies were ordered. 2D echo was ordered 08/15/17 and showed normal LVEF at 55-60%. She was noted to have moderate MR. Images were reviewed by both Dr. Acie Fredrickson and Dr. Burt Knack and it was felt that her MR was not severe enough to warrant further evaluation for mitral valve clipping. NST was also performed and showed a small area of moderate ischemia in the distal anterior and anterolateral, septal and apical walls. [name] could not exclude some shifting breast attenuation. The study was felt to be intermediate risk. Pt was seen back in clinic on 09/03/17 and noted improvement in  symptoms. She continued to have some occasional chest discomfort with moderate to heavy activities that improved quickly with rest, and denied any resting CP and no CP with basic ADLs. Plan was to continue medical therapy as opposed to sending for repeat cath. Addition of LA nitrate was considered but her there was little BP room to add.    Pt presented to the [facility] ED last PM with complaints or SOB, orthopnea, PND and LEE. She also endorsed intermittent CP described and tightness and triggered by exertion, productive cough (brown colored sputum) and fever but no chills. CTA suggested atypical PNA. No PE. Pt was admitted by IM and started on ceftriaxone and Zithromax. Blood cultures pending. She was also felt to be in acute on chronic diastolic HF. BNP was abnormal at 478. CXR did not show edema but LEE noted. Pt placed on IV Lasix. EKG showed atrial fibrillation w/ CVR and inferior ST abnormalities. Troponin's are negative x3. Hgb has dropped since January, down from 11.7>>9.6>>8.8. FOBT is positive. Eliquis is now on hold.  Cardiology consulted for CP.    She is currently CP free at rest. Her breathing has improved. SCr 1.26. K 3.6.     Past Medical History:  Diagnosis Date  . Adenocarcinoma, colon [facility]) Oncologist-- Dr Truitt Merle   Multifocal (2) Colon cancer @ ileocecal valve and ascending ( [id number]), Stage IIIB, grade I, MMR normal , 2 of 16 +lymph nodes, negative surgical margins---  06-02-2014  Right hemicolectomy w/ colostomy  .  Allergy   . Anxiety   . Asthma    inhaler "sometimes"  . CAD (coronary artery disease)    a. LHC 4/09: pLAD 40, mDx 70-75, pLCx 30, mLCx 50, mRCA 90, EF 60% >> PCI: BMS x2 to RCA;  b. Myoview 8/12: normal  . Cataract    bil cateracts removed  . Chronic anemia   . Chronic diastolic CHF (congestive heart failure) (HCC)    a. Echo 912 - Mild LVH, EF 55-60%, no RWMA, Gr 1 DD, mild MR, mild LAE, mild RAE, PASP 59 mmHg (mod to severe pulmo HTN)  . Clotting disorder (HCC)     hx of dvt, tia on eliquis  . Colostomy in place [facility])    s/p colostomy takedown 5/16  . Colovaginal fistula    s/p colostomy >> colostomy takedown 5/16  . COPD with chronic bronchitis ([place])   . Depression   . Dysrhythmia    A fib  . Essential hypertension   . GERD (gastroesophageal reflux disease)   . History of adenomatous polyp of colon   . History of blood transfusion   . History of DVT of lower extremity   . History of hiatal hernia   . History of TIA (transient ischemic attack)    a. Head CT 6/15: small chronic lacunar infarct in thalamus  . Hyperlipidemia   . OA (osteoarthritis)   . Osteoporosis   . Peripheral neuropathy   . Pneumonia   . Pulmonary HTN ([place])    a. PASP on Echo in 9/12:  59 mmHg  . Renal insufficiency   . Sigmoid diverticulosis    s/p sigmoid colectomy  . Stroke ([place])    TIAs  . Wears dentures   . Wears glasses     Past Surgical History:  Procedure Laterality Date  . ABDOMINAL HYSTERECTOMY    . ANTERIOR CERVICAL DECOMP/DISCECTOMY FUSION  01-31-2009   C5 -- 7  . APPENDECTOMY    . Bone spurs Bilateral    Feet  . CARDIOVASCULAR STRESS TEST  02-26-2011   Normal lexiscan no exercise study/  no ischemia/  normal LV function and wall motion, ef 67%  . CARPAL TUNNEL RELEASE Right   . CATARACT EXTRACTION W/ INTRAOCULAR LENS  IMPLANT, BILATERAL Bilateral   . CHOLECYSTECTOMY    . COLOSTOMY N/A 06/02/2014   Procedure: DIVERTING DESCENDING END COLOSTOMY;  Surgeon: Donnie Mesa, MD;  Location: [place];  Service: General;  Laterality: N/A;  . CORONARY ANGIOPLASTY WITH STENT PLACEMENT  11-04-2007  dr bensimhon   BMS x2 to RCA/  mild Non-obstructive disease LAD/  normal LVF  . DILATION AND CURETTAGE OF UTERUS    . EVALUATION UNDER ANESTHESIA WITH ANAL FISTULECTOMY N/A 10/13/2014   Procedure: ANAL EXAM UNDER ANESTHESIA ;  Surgeon: [name], MD;  Location: [facility];  Service: General;  Laterality: N/A;  . HAND SURGERY     Tendon repair  . LAPAROSCOPIC  SIGMOID COLECTOMY N/A 11/24/2014   Procedure: SIGMOID COLECTOMY AND COLSOTOMY CLOSURE;  Surgeon: Donnie Mesa, MD;  Location: [place];  Service: General;  Laterality: N/A;  . LUMBAR LAMINECTOMY  10-29-2002,  1969   Left  L3 -- 4  decompression  . PARTIAL COLECTOMY N/A 06/02/2014   Procedure: RIGHT PARTIAL COLECTOMY ;  Surgeon: Donnie Mesa, MD;  Location: [place];  Service: General;  Laterality: N/A;  . PATELLECTOMY Right 09/14/2013   Procedure: PATELLECTOMY;  Surgeon: Meredith Pel, MD;  Location: [place];  Service: Orthopedics;  Laterality: Right;  .  PROCTOSCOPY N/A 10/13/2014   Procedure: RIDGE PROCTOSCOPY;  Surgeon: [name], MD;  Location: [facility];  Service: General;  Laterality: N/A;  . REVISION TOTAL KNEE ARTHROPLASTY Bilateral right  04-02-2011/  left 1996 & 10-05-1999  . ROTATOR CUFF REPAIR Bilateral   . TONSILLECTOMY    . TOTAL KNEE ARTHROPLASTY Bilateral left 1994/  right 2000  . TOTAL KNEE REVISION Left 08/02/2015   Procedure: LEFT FEMORAL REVISION;  Surgeon: Gaynelle Arabian, MD;  Location: [facility];  Service: Orthopedics;  Laterality: Left;  . TRANSTHORACIC ECHOCARDIOGRAM  12-01-2009   Grade I diastolic dysfunction/  ef 60%/  moderate MR/  mild TR     Home Medications:  Prior to Admission medications   Medication Sig Start Date End Date Taking? Authorizing Provider  acetaminophen (TYLENOL) 325 MG tablet Take 650 mg by mouth every 6 (six) hours as needed for moderate pain.    Yes [provider]  apixaban (ELIQUIS) 5 MG TABS tablet Take 1 tablet (5 mg total) by mouth 2 (two) times daily. 10/23/16  Yes Marletta Lor, MD  Ascorbic Acid (VITAMIN C) 1000 MG tablet Take 1,000 mg by mouth daily.   Yes [provider]  benazepril (LOTENSIN) 40 MG tablet Take 40 mg by mouth daily. 10/21/17  Yes [provider]  diltiazem (CARDIZEM) 30 MG tablet Take 1 tablet (30 mg total) by mouth every 8 (eight) hours. 10/23/16  Yes Marletta Lor, MD  esomeprazole  (NEXIUM) 20 MG capsule Take 1 capsule (20 mg total) by mouth daily at 12 noon. Patient taking differently: Take 20 mg by mouth daily.  10/23/16  Yes Marletta Lor, MD  ferrous sulfate 325 (65 FE) MG EC tablet Take 325 mg by mouth daily with breakfast.   Yes [provider]  furosemide (LASIX) 20 MG tablet Take 1 tablet (20 mg total) by mouth daily. 08/06/17 11/04/17 Yes Simmons, Brittainy M, PA-C  gabapentin (NEURONTIN) 600 MG tablet Take 600 mg by mouth 2 (two) times daily.   Yes [provider]  HYDROcodone-acetaminophen (NORCO/VICODIN) 5-325 MG tablet Take 1-2 tablets by mouth every 6 (six) hours as needed for moderate pain. 08/04/17  Yes Marletta Lor, MD  LORazepam (ATIVAN) 0.5 MG tablet TAKE 1 TABLET BY MOUTH TWICE DAILY AS NEEDED FOR ANXIETY OR  SLEEP 08/04/17  Yes Marletta Lor, MD  metoprolol (LOPRESSOR) 100 MG tablet Take 1 tablet (100 mg total) by mouth 2 (two) times daily. 10/23/16  Yes Marletta Lor, MD  nitroGLYCERIN (NITROSTAT) 0.4 MG SL tablet Place 1 tablet (0.4 mg total) under the tongue every 5 (five) minutes as needed for chest pain (x 3 pills). Reported on 09/01/2015 10/23/16  Yes Marletta Lor, MD  promethazine (PHENERGAN) 12.5 MG tablet TAKE 1 TABLET (12.5 MG TOTAL) BY MOUTH EVERY 8 HOURS AS NEEDED FOR NAUSEA OR VOMITING. Patient taking differently: Take 12.5 mg by mouth every 8 (eight) hours as needed for nausea or vomiting.  10/23/16  Yes Marletta Lor, MD  sertraline (ZOLOFT) 50 MG tablet Take 1 tablet (50 mg total) by mouth daily. 10/23/16  Yes Marletta Lor, MD  traZODone (DESYREL) 100 MG tablet Take 0.5 tablets (50 mg total) by mouth at bedtime. 10/23/16  Yes Marletta Lor, MD  triamcinolone cream (KENALOG) 0.1 % Apply 1 application topically 2 (two) times daily as needed (Eczema on legs). 10/23/16  Yes Marletta Lor, MD    Inpatient Medications: Scheduled Meds: . diltiazem  30 mg Oral  Q8H  . docusate  sodium  100 mg Oral BID  . ferrous sulfate  325 mg Oral TID WC  . [START ON 10/28/2017] furosemide  20 mg Intravenous Daily  . gabapentin  600 mg Oral BID  . ipratropium  0.5 mg Nebulization Q6H  . levalbuterol  0.63 mg Nebulization Q6H  . metoprolol tartrate  100 mg Oral BID  . pantoprazole (PROTONIX) IV  40 mg Intravenous Q12H  . polyethylene glycol  17 g Oral Daily  . sertraline  50 mg Oral Daily  . traZODone  50 mg Oral QHS  . vitamin C  1,000 mg Oral Daily   Continuous Infusions: . azithromycin     PRN Meds: HYDROcodone-acetaminophen, LORazepam, ondansetron **OR** ondansetron (ZOFRAN) IV  Allergies:   No Known Allergies  Social History:   Social History   Socioeconomic History  . Marital status: Widowed    Spouse name: Not on file  . Number of children: Not on file  . Years of education: Not on file  . Highest education level: Not on file  Occupational History  . Not on file  Social Needs  . Financial resource strain: Not on file  . Food insecurity:    Worry: Not on file    Inability: Not on file  . Transportation needs:    Medical: Not on file    Non-medical: Not on file  Tobacco Use  . Smoking status: Former Smoker    Packs/day: 0.50    Years: 15.00    Pack years: 7.50    Types: Cigarettes    Last attempt to quit: 07/16/1967    Years since quitting: 50.3  . Smokeless tobacco: Never Used  Substance and Sexual Activity  . Alcohol use: No    Alcohol/week: 0.0 oz  . Drug use: No  . Sexual activity: Not on file  Lifestyle  . Physical activity:    Days per week: Not on file    Minutes per session: Not on file  . Stress: Not on file  Relationships  . Social connections:    Talks on phone: Not on file    Gets together: Not on file    Attends religious service: Not on file    Active member of club or organization: Not on file    Attends meetings of clubs or organizations: Not on file    Relationship status: Not on file  . Intimate partner violence:     Fear of current or ex partner: Not on file    Emotionally abused: Not on file    Physically abused: Not on file    Forced sexual activity: Not on file  Other Topics Concern  . Not on file  Social History Narrative   Married, 2 children.  As of November 2015 her husband has been living in a nursing home for tendon after years as he suffers from multiple medical problems.   Patient does not drink alcohol nor does she smoke or chew tobacco products   Right handed   8th grade   1 cup daily    Family History:    Family History  Problem Relation Age of Onset  . Osteoarthritis Mother   . Heart failure Mother   . Colon cancer Maternal Uncle 34  . Heart Problems Father   . Cancer Sister 16       ? colon cancer   . Kidney cancer Brother 63  . Esophageal cancer Neg Hx   . Rectal cancer Neg Hx   .  Stomach cancer Neg Hx   . Pancreatic cancer Neg Hx      ROS:  Please see the history of present illness.   All other ROS reviewed and negative.     Physical Exam/Data:   Vitals:   10/27/17 0615 10/27/17 0700 10/27/17 1104 10/27/17 1244  BP: 126/71  (!) 106/50 (!) 100/48  Pulse: 79  68 68  Resp:    18  Temp: (!) 97.3 F (36.3 C)   97.8 F (36.6 C)  TempSrc: Oral   Oral  SpO2: 100% 97%  99%  Weight: 187 lb 11.2 oz (85.1 kg)     Height:        Intake/Output Summary (Last 24 hours) at 10/27/2017 1408 Last data filed at 10/27/2017 0925 Gross per 24 hour  Intake 625.5 ml  Output -  Net 625.5 ml   Filed Weights   10/26/17 2335 10/27/17 0615  Weight: 189 lb (85.7 kg) 187 lb 11.2 oz (85.1 kg)   Body mass index is 32.22 kg/m.  General:  Well nourished, well developed, in no acute distress, elderly  HEENT: normal Lymph: no adenopathy Neck: no JVD Endocrine:  No thryomegaly Vascular: No carotid bruits; FA pulses 2+ bilaterally without bruits  Cardiac:  irregularly irregular, regular rate  Lungs:  clear to auscultation bilaterally, no wheezing, rhonchi or rales  Abd: soft,  nontender, no hepatomegaly  Ext: 1+ ankle edema Musculoskeletal:  No deformities, BUE and BLE strength normal and equal Skin: warm and dry  Neuro:  CNs 2-12 intact, no focal abnormalities noted Psych:  Normal affect   EKG:  The EKG was personally reviewed and demonstrates:  Chronic atrial fibrillation w/ CVR and inferior ST abnormalities Telemetry:  Telemetry was personally reviewed and demonstrates:  Chronic atrial fibrillation w/ CVR in the 70s  Relevant CV Studies: 2D Echo 08/15/17 Study Conclusions  - Left ventricle: The cavity size was normal. There was mild focal   basal hypertrophy of the septum. Systolic function was normal.   The estimated ejection fraction was in the range of 55% to 60%.   Wall motion was normal; there were no regional wall motion   abnormalities. The study is not technically sufficient to allow   evaluation of LV diastolic function. - Aortic valve: Trileaflet; mildly thickened, mildly calcified   leaflets. - Mitral valve: There was moderate regurgitation. - Left atrium: The atrium was severely dilated. - Right atrium: The atrium was severely dilated. - Tricuspid valve: There was moderate regurgitation. - Pulmonary arteries: Systolic pressure was moderately increased.   PA peak pressure: 54 mm Hg (S).  NST 08/15/17 Study Highlights    Nuclear stress EF: 52%.  Small region of moderate ischemia in the distal anterior, anterolateral, septal and apical walls. Cannot exclude some shifting breast attenuation.  There was no ST segment deviation noted during stress.  This is an intermediate risk study.       Laboratory Data:  Chemistry Recent Labs  Lab 10/26/17 1230 10/27/17 0610  NA 141 140  K 4.3 3.6  CL 105 103  CO2 25 26  GLUCOSE 105* 174*  BUN 38* 31*  CREATININE 1.34* 1.26*  CALCIUM 9.2 8.3*  GFRNONAA 35* 38*  GFRAA 41* 44*  ANIONGAP 11 11    Recent Labs  Lab 10/27/17 0610  PROT 6.2*  ALBUMIN 3.1*  AST 31  ALT 19  ALKPHOS 42    BILITOT 0.7   Hematology Recent Labs  Lab 10/26/17 1230 10/27/17 0610 10/27/17 1136  WBC  9.9 7.1 10.8*  RBC 3.81* 3.31* 3.51*  3.51*  HGB 9.6* 8.3* 8.8*  HCT 31.9* 28.1* 29.9*  MCV 83.7 84.9 85.2  MCH 25.2* 25.1* 25.1*  MCHC 30.1 29.5* 29.4*  RDW 17.6* 18.1* 18.4*  PLT 135* 131* 143*   Cardiac Enzymes Recent Labs  Lab 10/27/17 0048 10/27/17 0610 10/27/17 1136  TROPONINI <0.03 <0.03 <0.03    Recent Labs  Lab 10/26/17 1323  TROPIPOC 0.02    BNP Recent Labs  Lab 10/26/17 1230  BNP 478.6*    DDimer No results for input(s): DDIMER in the last 168 hours.  Radiology/Studies:  Dg Chest 2 View  Result Date: 10/26/2017 CLINICAL DATA:  Chest pain and increased SOB x 4 days. Hx COPD, PNA, heart stents, stroke. EXAM: CHEST - 2 VIEW COMPARISON:  Chest x-ray dated 07/31/2017. FINDINGS: Stable cardiomegaly. Lungs are clear. No pleural effusion or pneumothorax seen. Stable elevation/eventration of the RIGHT hemidiaphragm. No acute or suspicious osseous finding. IMPRESSION: No active cardiopulmonary disease. No evidence of pneumonia or pulmonary edema. Stable cardiomegaly. Electronically Signed   By: Franki Cabot M.D.   On: 10/26/2017 13:19   Ct Angio Chest Pe W/cm &/or Wo Cm  Result Date: 10/26/2017 CLINICAL DATA:  Short of breath bilateral lower limb swelling cough history of DVT EXAM: CT ANGIOGRAPHY CHEST WITH CONTRAST TECHNIQUE: Multidetector CT imaging of the chest was performed using the standard protocol during bolus administration of intravenous contrast. Multiplanar CT image reconstructions and MIPs were obtained to evaluate the vascular anatomy. CONTRAST:  159m ISOVUE-370 IOPAMIDOL (ISOVUE-370) INJECTION 76% COMPARISON:  Chest x-ray 10/26/2017, CT chest 07/22/2017, 09/12/2016 FINDINGS: Cardiovascular: Satisfactory opacification of the pulmonary arteries to the segmental level. No evidence of pulmonary embolism. Moderate aortic atherosclerosis. No aneurysmal dilatation.  Coronary vascular calcification. Mild cardiomegaly. No pericardial effusion. Mediastinum/Nodes: Midline trachea. No thyroid mass. Stable 11 mm low right paratracheal lymph nodes. Esophagus within normal limits Lungs/Pleura: Bilateral mosaic pattern, more prominent compared to prior with right greater than left ground-glass densities and nodularity, increased compared to the prior chest CT. No pleural effusion or pneumothorax. Upper Abdomen: Reflux of contrast into the hepatic veins. Surgical clips in the right upper quadrant. Musculoskeletal: Degenerative changes. No acute or suspicious abnormality. Review of the MIP images confirms the above findings. IMPRESSION: 1. Negative for acute pulmonary embolus. 2. Right greater than left ground-glass density with increased nodularity in the right upper, middle and lower lobes, suspicious for pulmonary infection, possible atypical pneumonia. Short interval CT follow-up recommended following antibiotic trial and ensure clearing. Persistent mediastinal adenopathy, possibly reactive and not significantly changed since January 2019. Aortic Atherosclerosis (ICD10-I70.0). Electronically Signed   By: KDonavan FoilM.D.   On: 10/26/2017 21:03    Assessment and Plan:   LJazzman LoughmillerMay is a 84y.o. female with a hx of CAD s/p BMS to RCA x 2 in 20254 diastolic CHF, mitral regurgitation, HTN, HLD, persistent afib on Eliquis, chronic dyspnea on exertion, COPD, pulmonary HTN, prior TIA, prior DVT and h/o colon CA who was admitted overnight for CAP and acute on chronic diastolic HF, also found to be anemic and FOBT possitive. She is being seen today for the evaluation of chest pain,  at the request of Dr. PPosey Pronto Internal Medicine.   1. Chest Pain: in the setting of PNA as well as underlying CAD with worsening anemia. EKG shows chronic atrial fibrillation with CVR and inferior ST abnormalities. However troponins are negative x 3. Suspect her chest pain Ciolek be related to demand  ischemia in the setting of CAD and anemia. Not a candidate for invasive cardiac cath at this time due to anemia. She [place] benefit from blood transfusion.   2.  Anemia: Hgb has dropped since January, down from 11.7>>9.6>>8.8. FOBT is positive. Eliquis is now on hold. She does have a h/o colon cancer. Needs GI w/u and possible transfusion given CAD and CP.   3. CAD: h/o BMS to RCA x 2 in 2009. Recent NST 08/2017 showed a small area of moderate ischemia in the distal anterior and anterolateral, septal and apical walls. [name] could not exclude some shifting breast attenuation. The study was felt to be intermediate risk, however medical therapy was elected as pt only with exertional symptoms with moderate to heavy activity. She has had recent CP with slight ST depressions in the inferior leads, but this [place] be demand ischemia 2/2 worsening anemia. Given her anemia and fact that FOBT is possitive, she is currently not a candidate for cath given suspected GIB. Recommend continuation of medical therapy. Continue BB. Recommend adding statin. No ASA until further GI w/u. Eliquis for atrial fibrillation is on hold.   4. CAP: on antibiotics for atypical PNA. Blood cultures pending.   5. Chronic Atrial Fibrillation: rate is controlled with metoprolol and Cardizem. Eliquis is on hold for worsening anemia, w/ Drop in Hgb from 11>8.8. FOBT +. Will need to determine if anticoagulation can be restarted in the future based on GI w/u. CHA2DS2 VASc score is at least 7 for CHF, HTN, Age > 45, h/o TIA and female sex. This patients CHA2DS2-VASc Score and unadjusted Ischemic Stroke Rate (% per year) is equal to 11.2 % stroke rate/year from a score of 7  Above score calculated as 1 point each if present [CHF, HTN, DM, Vascular=MI/PAD/Aortic Plaque, Age if 65-74, or Female] Above score calculated as 2 points each if present [Age > 75, or Stroke/TIA/TE]  6. Acute on Chronic Diastolic HF: Echo 03/299 showed normal LVEF. BNP this  admit abnormal at 478. She is being treated with IV lasix. Still with bilateral LEE on exam. Pt reports good UOP but output not being recorded. Will order strict I/Os and daily weights. Renal function is stable. Continue lasix. Monitor renal function and electrolytes. Low sodium diet.   7. Mitral Regurgitation: Moderate by recent echo. Not severe enough to be considered for MV clip at that time.   8. H/o LE DVT: pt reports DVT occurred post TKR several years ago. Eliquis currently on hold given suspected GIB. Continue with SCDs.    For questions or updates, please contact [place] Please consult [email or web address] for contact info under Cardiology/STEMI.   Signed, Lyda Jester, PA-C  10/27/2017 2:08 PM  Pager: [phone]  The patient was seen, examined and discussed with Brittainy M. Rosita Fire, PA-C and I agree with the above.   84y.o.female with known CAD s/p BMS to RCA x 2 in 6226, diastolic CHF, mitral regurgitation, HTN, HLD, persistent afib on Eliquis, chronic dyspnea on exertion, COPD, pulmonary HTN, prior TIA, prior DVT and h/o colon CA. The patient had a recent nuclear stress test in Feb 2019 with mid to apical anterior wall ischemia.  She was treated medically as her symptoms improved.  She presented yesterday with chest tightness 5/10, that is worse on exertion, also productive cough and SOB. She was diagnosed with atypical pneumonia, started on ceftriaxone and Zithromax. Blood cultures pending. She was also felt to be in acute on chronic diastolic HF. BNP was abnormal at 478.  CXR did not show edema, however she has crackles on physical exam. Pt placed on IV Lasix. EKG showed atrial fibrillation w/ CVR and inferior ST abnormalities. Troponin's are negative x3. Hgb has dropped since January, down from 11.9 --> 8.8. She has positive FOBT. Complaining of significant leg cramping since yesterday.   Assessment: Demand ischemia in the settings of known anterior ischemia, anemia and  atypical pneumonia  Plan: Continue iv lasix 20 mg iv BID Replace potassium, hopefully helps with cramping, replace K Give low dose tramadol for pain Unable to add nitrates as she is hypotensive Consider blood transfusion to keep Hb>9 Possible cath in the future once anemia bleeding resolved   Ena Dawley, MD 10/27/2017

## 2017-10-28 ENCOUNTER — Inpatient Hospital Stay (HOSPITAL_COMMUNITY): Payer: PPO | Admitting: Anesthesiology

## 2017-10-28 ENCOUNTER — Other Ambulatory Visit (HOSPITAL_COMMUNITY): Payer: PPO

## 2017-10-28 ENCOUNTER — Encounter (HOSPITAL_COMMUNITY): Payer: Self-pay | Admitting: *Deleted

## 2017-10-28 ENCOUNTER — Encounter (HOSPITAL_COMMUNITY)
Admission: EM | Disposition: A | Discharge status: Home Health | Payer: Self-pay | Source: Home / Self Care | Attending: Internal Medicine

## 2017-10-28 ENCOUNTER — Inpatient Hospital Stay (HOSPITAL_COMMUNITY): Payer: PPO

## 2017-10-28 ENCOUNTER — Other Ambulatory Visit: Payer: Self-pay

## 2017-10-28 DIAGNOSIS — I10 Essential (primary) hypertension: Secondary | ICD-10-CM

## 2017-10-28 DIAGNOSIS — K922 Gastrointestinal hemorrhage, unspecified: Secondary | ICD-10-CM

## 2017-10-28 DIAGNOSIS — D509 Iron deficiency anemia, unspecified: Secondary | ICD-10-CM

## 2017-10-28 DIAGNOSIS — I340 Nonrheumatic mitral (valve) insufficiency: Secondary | ICD-10-CM

## 2017-10-28 HISTORY — PX: ESOPHAGOGASTRODUODENOSCOPY: SHX5428

## 2017-10-28 LAB — CBC
HCT: 28.8 % — ABNORMAL LOW (ref 36.0–46.0)
Hemoglobin: 8.2 g/dL — ABNORMAL LOW (ref 12.0–15.0)
MCH: 24.6 pg — ABNORMAL LOW (ref 26.0–34.0)
MCHC: 28.5 g/dL — ABNORMAL LOW (ref 30.0–36.0)
MCV: 86.2 fL (ref 78.0–100.0)
Platelets: 142 10*3/uL — ABNORMAL LOW (ref 150–400)
RBC: 3.34 MIL/uL — ABNORMAL LOW (ref 3.87–5.11)
RDW: 18.3 % — ABNORMAL HIGH (ref 11.5–15.5)
WBC: 10.6 10*3/uL — ABNORMAL HIGH (ref 4.0–10.5)

## 2017-10-28 LAB — COMPREHENSIVE METABOLIC PANEL
ALT: 22 U/L (ref 14–54)
AST: 34 U/L (ref 15–41)
Albumin: 3.1 g/dL — ABNORMAL LOW (ref 3.5–5.0)
Alkaline Phosphatase: 40 U/L (ref 38–126)
Anion gap: 8 (ref 5–15)
BUN: 33 mg/dL — ABNORMAL HIGH (ref 6–20)
CO2: 27 mmol/L (ref 22–32)
Calcium: 8.6 mg/dL — ABNORMAL LOW (ref 8.9–10.3)
Chloride: 104 mmol/L (ref 101–111)
Creatinine, Ser: 1.28 mg/dL — ABNORMAL HIGH (ref 0.44–1.00)
GFR calc Af Amer: 43 mL/min — ABNORMAL LOW (ref >60)
GFR calc non Af Amer: 37 mL/min — ABNORMAL LOW (ref >60)
Glucose, Bld: 97 mg/dL (ref 65–99)
Potassium: 4.5 mmol/L (ref 3.5–5.1)
Sodium: 139 mmol/L (ref 135–145)
Total Bilirubin: 0.6 mg/dL (ref 0.3–1.2)
Total Protein: 6.1 g/dL — ABNORMAL LOW (ref 6.5–8.1)

## 2017-10-28 LAB — PREPARE RBC (CROSSMATCH)

## 2017-10-28 LAB — ECHOCARDIOGRAM COMPLETE
Height: 64 in
Weight: 3028.8 oz

## 2017-10-28 SURGERY — EGD (ESOPHAGOGASTRODUODENOSCOPY)
Anesthesia: Monitor Anesthesia Care

## 2017-10-28 MED ORDER — PROPOFOL 10 MG/ML IV BOLUS
INTRAVENOUS | Status: DC | PRN
  Administered 2017-10-28: 50 mg via INTRAVENOUS
  Administered 2017-10-28 (×2): 25 mg via INTRAVENOUS

## 2017-10-28 MED ORDER — LACTATED RINGERS IV SOLN
INTRAVENOUS | Status: DC | PRN
  Administered 2017-10-28: 11:00:00 via INTRAVENOUS

## 2017-10-28 MED ORDER — GUAIFENESIN-DM 100-10 MG/5ML PO SYRP
5.0000 mL | ORAL_SOLUTION | ORAL | Status: DC | PRN
  Administered 2017-10-28 – 2017-10-29 (×3): 5 mL via ORAL
  Filled 2017-10-28 (×3): qty 5

## 2017-10-28 MED ORDER — LIDOCAINE HCL (CARDIAC) 20 MG/ML IV SOLN
INTRAVENOUS | Status: DC | PRN
  Administered 2017-10-28: 50 mg via INTRAVENOUS

## 2017-10-28 MED ORDER — AZITHROMYCIN 500 MG PO TABS
500.0000 mg | ORAL_TABLET | Freq: Every day | ORAL | Status: AC
  Administered 2017-10-29 – 2017-10-31 (×3): 500 mg via ORAL
  Filled 2017-10-28 (×3): qty 1

## 2017-10-28 MED ORDER — SODIUM CHLORIDE 0.9 % IV SOLN
Freq: Once | INTRAVENOUS | Status: AC
  Administered 2017-10-28: 18:00:00 via INTRAVENOUS

## 2017-10-28 MED ORDER — DEXTROSE 5 % IV SOLN
INTRAVENOUS | Status: DC | PRN
  Administered 2017-10-28: 40 ug/min via INTRAVENOUS

## 2017-10-28 MED ORDER — PANTOPRAZOLE SODIUM 40 MG PO TBEC
40.0000 mg | DELAYED_RELEASE_TABLET | Freq: Two times a day (BID) | ORAL | Status: DC
  Administered 2017-10-28 – 2017-10-31 (×6): 40 mg via ORAL
  Filled 2017-10-28 (×6): qty 1

## 2017-10-28 NOTE — Op Note (Signed)
[facility] Patient Name: [name] Procedure Date : 10/28/2017 MRN: [id number] Attending MD: Carlota Raspberry. Harun Brumley MD, MD Date of Birth: [birth year] CSN: [id number] Age: 84 Admit Type: Inpatient Procedure:                Upper GI endoscopy Indications:              worsening iron deficiency anemia, Suspected upper                            gastrointestinal bleeding, Nausea with vomiting Providers:                Remo Lipps P. Roshelle Traub MD, MD, Burtis Junes, RN, Elspeth Cho Tech., Technician, Izora Gala, CRNA Referring MD:              Medicines:                Monitored Anesthesia Care Complications:            No immediate complications. Estimated blood loss:                            Minimal. Estimated Blood Loss:     Estimated blood loss was minimal. Procedure:                Pre-Anesthesia Assessment:                           - Prior to the procedure, a History and Physical                            was performed, and patient medications and                            allergies were reviewed. The patient's tolerance of                            previous anesthesia was also reviewed. The risks                            and benefits of the procedure and the sedation                            options and risks were discussed with the patient.                            All questions were answered, and informed consent                            was obtained. Prior Anticoagulants: The patient has                            taken Eliquis (apixaban), last dose was 2 days  prior to procedure. ASA Grade Assessment: III - A                            patient with severe systemic disease. After                            reviewing the risks and benefits, the patient was                            deemed in satisfactory condition to undergo the                            procedure.                           After  obtaining informed consent, the endoscope was                            passed under direct vision. Throughout the                            procedure, the patient's blood pressure, pulse, and                            oxygen saturations were monitored continuously. The                            EG-[id number] ([id number]) scope was introduced through the                            mouth, and advanced to the second part of duodenum.                            The upper GI endoscopy was accomplished without                            difficulty. The patient tolerated the procedure                            well. Scope In: Scope Out: Findings:      Esophagogastric landmarks were identified: the Z-line was found at 39       cm, the gastroesophageal junction was found at 39 cm and the upper       extent of the gastric folds was found at 44 cm from the incisors.      A 5 cm hiatal hernia was present.      The exam of the esophagus was otherwise normal.      A few small non-bleeding clean based superficial gastric ulcers with no       stigmata of bleeding were found in the cardia, within the hernia sac,       just inferior to the GEJ.      The exam of the stomach was otherwise normal.      Biopsies were taken with a cold forceps in the gastric body, at the  incisura and in the gastric antrum for Helicobacter pylori testing.      The duodenal bulb and second portion of the duodenum were normal. Impression:               - Esophagogastric landmarks identified.                           - 5 cm hiatal hernia.                           - Non-bleeding superficial small gastric ulcers                            with no high risk stigmata of bleeding.                           - Normal duodenal bulb and second portion of the                            duodenum.                           - Biopsies were taken with a cold forceps for                            Helicobacter pylori testing.                            I suspect ulcerations are the likely cause for the                            patient's worsening anemia in the setting of                            Eliquis use Recommendation:           - Return patient to hospital ward for ongoing care.                           - Resume previous diet.                           - Continue present medications.                           - Can switch protonix to 40mg  PO twice daily and                            would stay on this dose for at least one month                            before decreasing                           - Await pathology results, rule out H Pylori                           -  Avoid NSAIDs                           - Resume Eliquis in the next few days if otherwise                            stable                           - Call with questions Procedure Code(s):        --- Professional ---                           [phone], Esophagogastroduodenoscopy, flexible,                            transoral; with biopsy, single or multiple Diagnosis Code(s):        --- Professional ---                           K44.9, Diaphragmatic hernia without obstruction or                            gangrene                           K25.9, Gastric ulcer, unspecified as acute or                            chronic, without hemorrhage or perforation                           D50.9, Iron deficiency anemia, unspecified                           R11.2, Nausea with vomiting, unspecified CPT copyright 2017 American Medical Association. All rights reserved. The codes documented in this report are preliminary and upon coder review [name]  be revised to meet current compliance requirements. Remo Lipps P. Navada Osterhout MD, MD 10/28/2017 11:53:30 AM This report has been signed electronically. Number of Addenda: 0

## 2017-10-28 NOTE — Transfer of Care (Signed)
Immediate Anesthesia Transfer of Care Note  Patient: [name]  Procedure(s) Performed: ESOPHAGOGASTRODUODENOSCOPY (EGD) (N/A )  Patient Location: PACU and Endoscopy Unit  Anesthesia Type:MAC  Level of Consciousness: awake, alert , oriented and patient cooperative  Airway & Oxygen Therapy: Patient Spontanous Breathing and Patient connected to nasal cannula oxygen  Post-op Assessment: Report given to RN, Post -op Vital signs reviewed and stable and Patient moving all extremities  Post vital signs: Reviewed and stable  Last Vitals:  Vitals Value Taken Time  BP 118/40 10/28/2017 11:52 AM  Temp    Pulse 79 10/28/2017 11:54 AM  Resp 21 10/28/2017 11:54 AM  SpO2 92 % 10/28/2017 11:54 AM  Vitals shown include unvalidated device data.  Last Pain:  Vitals:   10/28/17 1151  TempSrc: Oral  PainSc:       Patients Stated Pain Goal: 0 (07/62/26 3335)  Complications: No apparent anesthesia complications

## 2017-10-28 NOTE — Anesthesia Preprocedure Evaluation (Addendum)
Anesthesia Evaluation  Patient identified by MRN, date of birth, ID band Patient awake    Reviewed: Allergy & Precautions, H&P , Patient's Chart, lab work & pertinent test results, reviewed documented beta blocker date and time   Airway Mallampati: II  TM Distance: >3 FB Neck ROM: full    Dental no notable dental hx.    Pulmonary asthma , former smoker,    Pulmonary exam normal breath sounds clear to auscultation       Cardiovascular hypertension, +CHF  Atrial Fibrillation  Rhythm:regular Rate:Normal     Neuro/Psych    GI/Hepatic   Endo/Other    Renal/GU      Musculoskeletal   Abdominal   Peds  Hematology  (+) anemia ,   Anesthesia Other Findings Good EF Hx of CHF CVA   Reproductive/Obstetrics                            Anesthesia Physical Anesthesia Plan  ASA: III  Anesthesia Plan: MAC   Post-op Pain Management:    Induction: Intravenous  PONV Risk Score and Plan:   Airway Management Planned: Mask and Natural Airway  Additional Equipment:   Intra-op Plan:   Post-operative Plan:   Informed Consent: I have reviewed the patients History and Physical, chart, labs and discussed the procedure including the risks, benefits and alternatives for the proposed anesthesia with the patient or authorized representative who has indicated his/her understanding and acceptance.   Dental Advisory Given  Plan Discussed with: CRNA and Surgeon  Anesthesia Plan Comments:        Anesthesia Quick Evaluation

## 2017-10-28 NOTE — Consult Note (Signed)
   [facility] CM Inpatient Consult   10/28/2017  [name] 10-24-1929 [id number]   Referral received from inpatient RNCM regarding medications assistance for patient's affordability issue.  Patient was initially in procedure are for EGD.  Returned to speak with the patient in regards to post hospital needs.  Patient admitted with pneumonia and possible GI bleed.  She states she has had difficulty affording her Eliquis.  She states that she doesn't know if she will be on the medications since the doctors thinks she has a bleed in her duodenal area. Patient was also assigned to [facility] EMMI pneumonia. Patient felt that this would be find.  Consent form signed and copy with folder of [facility] information was given.  Will follow for disposition needs especially regarding her Eliquis.  For questions, please contact:  Natividad Brood, RN BSN [facility] Liaison  [phone] business mobile phone Toll free office [phone]

## 2017-10-28 NOTE — Progress Notes (Signed)
  Echocardiogram 2D Echocardiogram has been performed.  [name] T [name] 10/28/2017, 3:15 PM

## 2017-10-28 NOTE — Progress Notes (Signed)
[facility] Progress Note  Patient: [name] WIO:[id number]   PCP: Marletta Lor, MD DOB: 1929-10-26   DOA: 10/26/2017   DOS: 10/28/2017   Date of Service: the patient was seen and examined on 10/28/2017  Subjective: Feels better, no more nausea or vomiting.  No fever no chills.  Brief hospital course: Pt. with PMH of  CAD, diastolic CHF, hypertension, chronic atrial fibrillation on anticoagulation with Eliquis, GERD, hyperlipidemia; admitted on 10/26/2017, presented with complaint of fatigue, was found to have acute on chronic diastolic CHF and intractable nausea and vomiting Currently further plan is cardiology consult, further workup for intractable nausea and vomiting as well as positive Hemoccult.  Assessment and Plan: Principal Problem:   Acute on chronic diastolic CHF (congestive heart failure) (HCC)   Abnormal stress test   Moderate mitral regurgitation   Coronary artery disease of native artery of native heart with stable angina pectoris ([place])   Essential hypertension   Acute hypoxic respiratory failure Presents with complaints of shortness of breath although reported as ongoing since last 1 week but per patient progressively worsening over last few months. Presented with 86% on room air saturation, 100% on 2 L of oxygen, oxygenation improving with diuresis. echocardiogram February 2019 shows EF of 55-60%, moderate MR. Recent outpatient nuclear stress test is intermediate risk as well with ischemia in mid anterior, apical area. Currently chest pain-free, troponins negative. CT chest PE protocol on admission is negative for pulmonary embolism. Shows groundglass density which appears more likely CHF rather than atypical pneumonia to me. Patient denies any significant signs of infection at home, no leukocytosis here. Started on IV Lasix, will continue it for now. Currently on Cardizem, metoprolol and Lasix. Appreciate cardiology input. Transfusing 1 PRBC based on the  recommendation to maintain hemoglobin more than 9. Cath in future once anemia and bleeding is resolved.    Chronic atrial fibrillation (HCC)   Chronic anticoagulation Continue metoprolol and Cardizem. Hold Eliquis in the setting of possible GI bleed.     GERD   Lethargy ?  Hematemesis, iron deficiency anemia Primary complaint is fatigue for patient. Has history of GERD. On PPI. Hemoglobin dropped from 11-8. Patient reported coffee colored emesis. No blood noted here although Hemoccult positive. Has a history of colon cancer causing iron deficiency anemia. Iron level low, will replace. Transfuse for hemoglobin less than 9 per cardiology. GI consulted.  S/P EGD shows superficial ulcer possible site of bleeding without any active bleeding at present. IV PPI twice a day on clear liquid diet for now.    Cancer of ascending colon (HCC)   Nausea and vomiting Patient has significant abdominal pain on left lower quadrant as well as ongoing nausea as well as multivessel episode of vomiting at home. CT scan of the abdomen performed, tolerated well.  No evidence of acute obstruction or other acute abnormality.    CAP (community acquired pneumonia) Presented with shortness of breath, get a CT chest which was reported as a possible atypical pneumonia. Although it appears that patient has more CHF presentation rather than pneumonia presentation. Currently on IV antibiotics.  To oral azithromycin finished total 5-day course.    Thrombocytopenia (HCC)    Iron deficiency anemia We will start the patient on iron supplementation. Platelets are relatively low, but stable will monitor.    Anxiety and depression Continue Zoloft, trazodone, Ativan, gabapentin  Diet: Per GI DVT Prophylaxis: mechanical compression device (apixaban on hold)  Advance goals of care discussion: full code  Family  Communication: family was present at bedside, at the time of interview. The pt provided permission to  discuss medical plan with the family. Opportunity was given to ask question and all questions were answered satisfactorily.   Disposition:  Discharge to be determined.  Consultants: cardiology, gastroenterology. Procedures: NONE  Antibiotics: Anti-infectives (From admission, onward)   Start     Dose/Rate Route Frequency Ordered Stop   10/27/17 2200  cefTRIAXone (ROCEPHIN) 1 g in sodium chloride 0.9 % 100 mL IVPB  Status:  Discontinued     1 g 200 mL/hr over 30 Minutes Intravenous Every 24 hours 10/26/17 2203 10/27/17 1135   10/27/17 1600  azithromycin (ZITHROMAX) 500 mg in sodium chloride 0.9 % 250 mL IVPB     500 mg 250 mL/hr over 60 Minutes Intravenous Every 24 hours 10/26/17 2335     10/26/17 2130  cefTRIAXone (ROCEPHIN) 1 g in sodium chloride 0.9 % 100 mL IVPB     1 g 200 mL/hr over 30 Minutes Intravenous  Once 10/26/17 2119 10/26/17 2243   10/26/17 2130  azithromycin (ZITHROMAX) tablet 500 mg  Status:  Discontinued     500 mg Oral  Once 10/26/17 2119 10/26/17 2119   10/26/17 2130  azithromycin (ZITHROMAX) 500 mg in sodium chloride 0.9 % 250 mL IVPB     500 mg 250 mL/hr over 60 Minutes Intravenous  Once 10/26/17 2119 10/26/17 2254       Objective: Physical Exam: Vitals:   10/28/17 1150 10/28/17 1151 10/28/17 1200 10/28/17 1431  BP:  (!) 118/40 (!) 121/52 (!) 114/57  Pulse: (!) 25 74 72 65  Resp: (!) 21 20 15    Temp:  97.6 F (36.4 C)    TempSrc:  Oral    SpO2: (!) 84% 94% 99%   Weight:      Height:        Intake/Output Summary (Last 24 hours) at 10/28/2017 1635 Last data filed at 10/28/2017 1152 Gross per 24 hour  Intake 1240 ml  Output 1050 ml  Net 190 ml   Filed Weights   10/26/17 2335 10/27/17 0615 10/28/17 0429  Weight: 85.7 kg (189 lb) 85.1 kg (187 lb 11.2 oz) 85.9 kg (189 lb 4.8 oz)   General: Alert, Awake and Oriented to Time, Place and Person. Appear in marked distress, affect appropriate Eyes: PERRL, Conjunctiva normal ENT: Oral Mucosa clear  moisty. Neck: difficult to assess JVD, no Abnormal Mass Or lumps Cardiovascular: S1 and S2 Present, aortic systolic Murmur, Peripheral Pulses Present Respiratory: normal respiratory effort, Bilateral Air entry equal and Decreased, no use of accessory muscle, Clear to Auscultation, no Crackles, no wheezes Abdomen: Bowel Sound present, Soft and mild LUQ tenderness, no hernia Skin: no redness, no Rash, no induration Extremities: bilateral Pedal edema, no calf tenderness Neurologic: Grossly no focal neuro deficit. Bilaterally Equal motor strength  Data Reviewed: CBC: Recent Labs  Lab 10/26/17 1230 10/27/17 0610 10/27/17 1136 10/28/17 0900  WBC 9.9 7.1 10.8* 10.6*  HGB 9.6* 8.3* 8.8* 8.2*  HCT 31.9* 28.1* 29.9* 28.8*  MCV 83.7 84.9 85.2 86.2  PLT 135* 131* 143* 509*   Basic Metabolic Panel: Recent Labs  Lab 10/26/17 1230 10/27/17 0610 10/28/17 0900  NA 141 140 139  K 4.3 3.6 4.5  CL 105 103 104  CO2 25 26 27   GLUCOSE 105* 174* 97  BUN 38* 31* 33*  CREATININE 1.34* 1.26* 1.28*  CALCIUM 9.2 8.3* 8.6*  MG  --  1.9  --     Liver  Function Tests: Recent Labs  Lab 10/27/17 0610 10/28/17 0900  AST 31 34  ALT 19 22  ALKPHOS 42 40  BILITOT 0.7 0.6  PROT 6.2* 6.1*  ALBUMIN 3.1* 3.1*   No results for input(s): LIPASE, AMYLASE in the last 168 hours. No results for input(s): AMMONIA in the last 168 hours. Coagulation Profile: No results for input(s): INR, PROTIME in the last 168 hours. Cardiac Enzymes: Recent Labs  Lab 10/27/17 0048 10/27/17 0610 10/27/17 1136  TROPONINI <0.03 <0.03 <0.03   BNP (last 3 results) Recent Labs    08/04/17 1222  PROBNP 366.0*   CBG: No results for input(s): GLUCAP in the last 168 hours. Studies: Ct Abdomen Pelvis W Contrast  Result Date: 10/27/2017 CLINICAL DATA:  Subacute onset of generalized weakness, nausea and vomiting. EXAM: CT ABDOMEN AND PELVIS WITH CONTRAST TECHNIQUE: Multidetector CT imaging of the abdomen and pelvis was  performed using the standard protocol following bolus administration of intravenous contrast. CONTRAST:  155mL ISOVUE-300 IOPAMIDOL (ISOVUE-300) INJECTION 61% COMPARISON:  CT of the abdomen and pelvis from 07/22/2017 FINDINGS: Lower chest: Mild hazy right basilar airspace opacity Schwanke reflect mild asymmetric interstitial edema or possibly pneumonia. A moderate hiatal hernia is noted. Scattered coronary artery calcifications are seen. Hepatobiliary: The liver is unremarkable in appearance. The patient is status post cholecystectomy, with clips noted at the gallbladder fossa. The common bile duct remains normal in caliber. Pancreas: The pancreas is within normal limits. Spleen: The spleen is unremarkable in appearance. Adrenals/Urinary Tract: The adrenal glands are unremarkable in appearance. Nonspecific perinephric stranding is noted bilaterally. A small left renal cyst is noted. There is no evidence of hydronephrosis. No renal or ureteral stones are identified. Stomach/Bowel: The patient is status post resection of the cecum. The ileocolic anastomosis is unremarkable in appearance. Bowel suture lines are noted at the sigmoid colon. The small bowel is grossly unremarkable in appearance. The stomach is otherwise unremarkable in appearance. Vascular/Lymphatic: Scattered calcification is seen along the abdominal aorta and its branches. The abdominal aorta is otherwise grossly unremarkable. The inferior vena cava is grossly unremarkable. No retroperitoneal lymphadenopathy is seen. No pelvic sidewall lymphadenopathy is identified. Reproductive: The bladder is mildly distended and grossly unremarkable. The patient is status post hysterectomy. No suspicious adnexal masses are seen. Other: No additional soft tissue abnormalities are seen. Musculoskeletal: No acute osseous abnormalities are identified. Multilevel vacuum phenomenon is noted along the lumbar spine. The visualized musculature is unremarkable in appearance.  IMPRESSION: 1. Mild hazy right basilar airspace opacity [place] reflect mild asymmetric interstitial edema or possibly pneumonia. 2. Moderate hiatal hernia. 3. Scattered coronary artery calcifications. 4. Small left renal cyst. Aortic Atherosclerosis (ICD10-I70.0). Electronically Signed   By: Garald Balding M.D.   On: 10/27/2017 23:23    Scheduled Meds: . diltiazem  30 mg Oral Q8H  . docusate sodium  100 mg Oral BID  . ferrous sulfate  325 mg Oral TID WC  . furosemide  20 mg Intravenous BID  . gabapentin  600 mg Oral BID  . metoprolol tartrate  100 mg Oral BID  . pantoprazole  40 mg Oral BID  . polyethylene glycol  17 g Oral Daily  . sertraline  50 mg Oral Daily  . traZODone  50 mg Oral QHS  . vitamin C  1,000 mg Oral Daily   Continuous Infusions: . sodium chloride    . azithromycin 500 mg (10/28/17 1611)   PRN Meds: guaiFENesin-dextromethorphan, HYDROcodone-acetaminophen, ipratropium, levalbuterol, LORazepam, ondansetron **OR** ondansetron (ZOFRAN) IV, traMADol  Time spent: 35 minutes  Author: Berle Mull, MD [facility] Pager: [phone] 10/28/2017 4:35 PM  If 7PM-7AM, please contact night-coverage at [email or web address], password [facility]

## 2017-10-28 NOTE — Anesthesia Procedure Notes (Signed)
Procedure Name: MAC Date/Time: 10/28/2017 11:15 AM Performed by: Izora Gala, CRNA Pre-anesthesia Checklist: Patient identified, Emergency Drugs available, Suction available and Patient being monitored Oxygen Delivery Method: Simple face mask and Nasal cannula Preoxygenation: Pre-oxygenation with 100% oxygen Placement Confirmation: positive ETCO2 Dental Injury: Teeth and Oropharynx as per pre-operative assessment

## 2017-10-28 NOTE — Anesthesia Postprocedure Evaluation (Signed)
Anesthesia Post Note  Patient: [name]  Procedure(s) Performed: ESOPHAGOGASTRODUODENOSCOPY (EGD) (N/A )     Patient location during evaluation: PACU Anesthesia Type: MAC Level of consciousness: awake and alert Pain management: pain level controlled Vital Signs Assessment: post-procedure vital signs reviewed and stable Respiratory status: spontaneous breathing, nonlabored ventilation, respiratory function stable and patient connected to nasal cannula oxygen Cardiovascular status: stable and blood pressure returned to baseline Postop Assessment: no apparent nausea or vomiting Anesthetic complications: no    Last Vitals:  Vitals:   10/28/17 1200 10/28/17 1431  BP: (!) 121/52 (!) 114/57  Pulse: 72 65  Resp: 15   Temp:    SpO2: 99%     Last Pain:  Vitals:   10/28/17 1200  TempSrc:   PainSc: 0-No pain                 Tylena Prisk EDWARD

## 2017-10-28 NOTE — Interval H&P Note (Signed)
History and Physical Interval Note:  10/28/2017 11:13 AM  [name]  has presented today for surgery, with the diagnosis of nausea, vomiting,anemia, heme positive stool  The various methods of treatment have been discussed with the patient and family. After consideration of risks, benefits and other options for treatment, the patient has consented to  Procedure(s): ESOPHAGOGASTRODUODENOSCOPY (EGD) (N/A) as a surgical intervention .  The patient's history has been reviewed, patient examined, no change in status, stable for surgery.  I have reviewed the patient's chart and labs.  Questions were answered to the patient's satisfaction.     [place]

## 2017-10-28 NOTE — Progress Notes (Addendum)
Progress Note  Patient Name: [name] Date of Encounter: 10/28/2017  Primary Cardiologist: Dr. Grayland Jack  Subjective   Pt continues to have mild chest discomfort. Trop remain negative. Scheduled for endoscopy today to evaluate for possible GI bleed.   Inpatient Medications    Scheduled Meds: . diltiazem  30 mg Oral Q8H  . docusate sodium  100 mg Oral BID  . ferrous sulfate  325 mg Oral TID WC  . furosemide  20 mg Intravenous BID  . gabapentin  600 mg Oral BID  . metoprolol tartrate  100 mg Oral BID  . pantoprazole (PROTONIX) IV  40 mg Intravenous Q12H  . polyethylene glycol  17 g Oral Daily  . sertraline  50 mg Oral Daily  . traZODone  50 mg Oral QHS  . vitamin C  1,000 mg Oral Daily   Continuous Infusions: . azithromycin Stopped (10/27/17 1748)   PRN Meds: HYDROcodone-acetaminophen, ipratropium, levalbuterol, LORazepam, ondansetron **OR** ondansetron (ZOFRAN) IV, traMADol   Vital Signs    Vitals:   10/27/17 1420 10/27/17 2007 10/28/17 0003 10/28/17 0429  BP: (!) 95/48 (!) 97/56 106/71 (!) 136/92  Pulse:  77 81 71  Resp:  18 18 18   Temp:  98.1 F (36.7 C) 97.9 F (36.6 C) (!) 97.3 F (36.3 C)  TempSrc:  Oral Oral Oral  SpO2:  97% 96% 94%  Weight:    189 lb 4.8 oz (85.9 kg)  Height:        Intake/Output Summary (Last 24 hours) at 10/28/2017 0700 Last data filed at 10/28/2017 0430 Gross per 24 hour  Intake 1578.5 ml  Output 1050 ml  Net 528.5 ml   Filed Weights   10/26/17 2335 10/27/17 0615 10/28/17 0429  Weight: 189 lb (85.7 kg) 187 lb 11.2 oz (85.1 kg) 189 lb 4.8 oz (85.9 kg)    Physical Exam   General: Frail, elderly, NAD Skin: Warm, dry, intact  Head: Normocephalic, atraumatic, clear, moist mucus membranes. Neck: Negative for carotid bruits. No JVD Lungs: Clear to ausculation bilaterally. No wheezes, rales, or rhonchi. Breathing is unlabored. Cardiovascular: Irregularly irregular with S1 S2. No murmurs, rubs, gallops, or LV heave  appreciated. Abdomen: Soft, non-tender, non-distended with normoactive bowel sounds. No obvious abdominal masses. MSK: Strength and tone appear normal for age. 5/5 in all extremities Extremities: No edema. No clubbing or cyanosis. DP/PT pulses 2+ bilaterally Neuro: Alert and oriented. No focal deficits. No facial asymmetry. MAE spontaneously. Psych: Responds to questions appropriately with normal affect.    Labs    Chemistry Recent Labs  Lab 10/26/17 1230 10/27/17 0610  NA 141 140  K 4.3 3.6  CL 105 103  CO2 25 26  GLUCOSE 105* 174*  BUN 38* 31*  CREATININE 1.34* 1.26*  CALCIUM 9.2 8.3*  PROT  --  6.2*  ALBUMIN  --  3.1*  AST  --  31  ALT  --  19  ALKPHOS  --  42  BILITOT  --  0.7  GFRNONAA 35* 38*  GFRAA 41* 44*  ANIONGAP 11 11     Hematology Recent Labs  Lab 10/26/17 1230 10/27/17 0610 10/27/17 1136  WBC 9.9 7.1 10.8*  RBC 3.81* 3.31* 3.51*  3.51*  HGB 9.6* 8.3* 8.8*  HCT 31.9* 28.1* 29.9*  MCV 83.7 84.9 85.2  MCH 25.2* 25.1* 25.1*  MCHC 30.1 29.5* 29.4*  RDW 17.6* 18.1* 18.4*  PLT 135* 131* 143*    Cardiac Enzymes Recent Labs  Lab 10/27/17  0048 10/27/17 0610 10/27/17 1136  TROPONINI <0.03 <0.03 <0.03    Recent Labs  Lab 10/26/17 1323  TROPIPOC 0.02     BNP Recent Labs  Lab 10/26/17 1230  BNP 478.6*     DDimer No results for input(s): DDIMER in the last 168 hours.   Radiology    Dg Chest 2 View  Result Date: 10/26/2017 CLINICAL DATA:  Chest pain and increased SOB x 4 days. Hx COPD, PNA, heart stents, stroke. EXAM: CHEST - 2 VIEW COMPARISON:  Chest x-ray dated 07/31/2017. FINDINGS: Stable cardiomegaly. Lungs are clear. No pleural effusion or pneumothorax seen. Stable elevation/eventration of the RIGHT hemidiaphragm. No acute or suspicious osseous finding. IMPRESSION: No active cardiopulmonary disease. No evidence of pneumonia or pulmonary edema. Stable cardiomegaly. Electronically Signed   By: Franki Cabot M.D.   On: 10/26/2017 13:19    Ct Angio Chest Pe W/cm &/or Wo Cm  Result Date: 10/26/2017 CLINICAL DATA:  Short of breath bilateral lower limb swelling cough history of DVT EXAM: CT ANGIOGRAPHY CHEST WITH CONTRAST TECHNIQUE: Multidetector CT imaging of the chest was performed using the standard protocol during bolus administration of intravenous contrast. Multiplanar CT image reconstructions and MIPs were obtained to evaluate the vascular anatomy. CONTRAST:  184mL ISOVUE-370 IOPAMIDOL (ISOVUE-370) INJECTION 76% COMPARISON:  Chest x-ray 10/26/2017, CT chest 07/22/2017, 09/12/2016 FINDINGS: Cardiovascular: Satisfactory opacification of the pulmonary arteries to the segmental level. No evidence of pulmonary embolism. Moderate aortic atherosclerosis. No aneurysmal dilatation. Coronary vascular calcification. Mild cardiomegaly. No pericardial effusion. Mediastinum/Nodes: Midline trachea. No thyroid mass. Stable 11 mm low right paratracheal lymph nodes. Esophagus within normal limits Lungs/Pleura: Bilateral mosaic pattern, more prominent compared to prior with right greater than left ground-glass densities and nodularity, increased compared to the prior chest CT. No pleural effusion or pneumothorax. Upper Abdomen: Reflux of contrast into the hepatic veins. Surgical clips in the right upper quadrant. Musculoskeletal: Degenerative changes. No acute or suspicious abnormality. Review of the MIP images confirms the above findings. IMPRESSION: 1. Negative for acute pulmonary embolus. 2. Right greater than left ground-glass density with increased nodularity in the right upper, middle and lower lobes, suspicious for pulmonary infection, possible atypical pneumonia. Short interval CT follow-up recommended following antibiotic trial and ensure clearing. Persistent mediastinal adenopathy, possibly reactive and not significantly changed since January 2019. Aortic Atherosclerosis (ICD10-I70.0). Electronically Signed   By: Donavan Foil M.D.   On: 10/26/2017  21:03   Ct Abdomen Pelvis W Contrast  Result Date: 10/27/2017 CLINICAL DATA:  Subacute onset of generalized weakness, nausea and vomiting. EXAM: CT ABDOMEN AND PELVIS WITH CONTRAST TECHNIQUE: Multidetector CT imaging of the abdomen and pelvis was performed using the standard protocol following bolus administration of intravenous contrast. CONTRAST:  167mL ISOVUE-300 IOPAMIDOL (ISOVUE-300) INJECTION 61% COMPARISON:  CT of the abdomen and pelvis from 07/22/2017 FINDINGS: Lower chest: Mild hazy right basilar airspace opacity Pesantez reflect mild asymmetric interstitial edema or possibly pneumonia. A moderate hiatal hernia is noted. Scattered coronary artery calcifications are seen. Hepatobiliary: The liver is unremarkable in appearance. The patient is status post cholecystectomy, with clips noted at the gallbladder fossa. The common bile duct remains normal in caliber. Pancreas: The pancreas is within normal limits. Spleen: The spleen is unremarkable in appearance. Adrenals/Urinary Tract: The adrenal glands are unremarkable in appearance. Nonspecific perinephric stranding is noted bilaterally. A small left renal cyst is noted. There is no evidence of hydronephrosis. No renal or ureteral stones are identified. Stomach/Bowel: The patient is status post resection of the cecum. The  ileocolic anastomosis is unremarkable in appearance. Bowel suture lines are noted at the sigmoid colon. The small bowel is grossly unremarkable in appearance. The stomach is otherwise unremarkable in appearance. Vascular/Lymphatic: Scattered calcification is seen along the abdominal aorta and its branches. The abdominal aorta is otherwise grossly unremarkable. The inferior vena cava is grossly unremarkable. No retroperitoneal lymphadenopathy is seen. No pelvic sidewall lymphadenopathy is identified. Reproductive: The bladder is mildly distended and grossly unremarkable. The patient is status post hysterectomy. No suspicious adnexal masses are  seen. Other: No additional soft tissue abnormalities are seen. Musculoskeletal: No acute osseous abnormalities are identified. Multilevel vacuum phenomenon is noted along the lumbar spine. The visualized musculature is unremarkable in appearance. IMPRESSION: 1. Mild hazy right basilar airspace opacity Reh reflect mild asymmetric interstitial edema or possibly pneumonia. 2. Moderate hiatal hernia. 3. Scattered coronary artery calcifications. 4. Small left renal cyst. Aortic Atherosclerosis (ICD10-I70.0). Electronically Signed   By: Garald Balding M.D.   On: 10/27/2017 23:23    Telemetry    Atrial fibrillation HR 71 - Personally Reviewed  ECG     10/27/17 Atrial fibrillation with PVC HR 79 - Personally Reviewed  Cardiac Studies   2D Echo 08/15/17 Study Conclusions  - Left ventricle: The cavity size was normal. There was mild focal basal hypertrophy of the septum. Systolic function was normal. The estimated ejection fraction was in the range of 55% to 60%. Wall motion was normal; there were no regional wall motion abnormalities. The study is not technically sufficient to allow evaluation of LV diastolic function. - Aortic valve: Trileaflet; mildly thickened, mildly calcified leaflets. - Mitral valve: There was moderate regurgitation. - Left atrium: The atrium was severely dilated. - Right atrium: The atrium was severely dilated. - Tricuspid valve: There was moderate regurgitation. - Pulmonary arteries: Systolic pressure was moderately increased. PA peak pressure: 54 mm Hg (S).  NST 08/15/17 Study Highlights    Nuclear stress EF: 52%.  Small region of moderate ischemia in the distal anterior, anterolateral, septal and apical walls. Cannot exclude some shifting breast attenuation.  There was no ST segment deviation noted during stress.  This is an intermediate risk study.    Patient Profile     84 y.o. female with a hx of CAD s/p BMS to RCA x 2 in 4709,  diastolic CHF, mitral regurgitation, HTN, HLD, persistent afib on Eliquis, chronic dyspnea on exertion, COPD, pulmonary HTN, prior TIA, prior DVT and h/o colon CA who was admitted overnight for CAP and acute on chronic diastolic HF, also found to be anemic and + FOBT. She is being seen today for the evaluation of chest pain,  at the request of Dr. Posey Pronto, Internal Medicine.  Assessment & Plan    1. Chest Pain:  -Mild chest discomfort this AM>most likely CP secondary to demand ischemia in the setting of acute PNA -Trop, <0.03, <0.03, <0.03 -EKG without acute ST-T wave changes, atrial fibrillation  -Could consider cath once anemia stabilized>>GI consulted for endoscopy for possible upper GI tract bleeding today, 10/28/17 -Last Eliquis dose >24H ago  2.  Anemia: -GI consulted 10/27/17 -Hgb has dropped since January, 7.1 yesterday>>>10.8 today . Unclear as to why the significant increase in HB today. No PRBC given  -FOBT is positive on admission  -Eliquis is now on hold for endoscopy today   3. CAD:  -h/o BMS to RCA x 2 in 2009. Recent NST 08/2017 showed a small area of moderate ischemia in the distal anterior and anterolateral, septal and apical  walls. [name] could not exclude some shifting breast attenuation. The study was felt to be intermediate risk, however medical therapy was elected as pt only with exertional symptoms with moderate to heavy activity. She has had recent CP with slight ST depressions in the inferior leads, but this Scaletta be demand ischemia 2/2 worsening anemia.  -Given her anemia and FOBT is positive, she is currently not a candidate for cath given suspected GIB  -Recommend continuation of medical therapy -Continue BB with the addition statin.  -No ASA until further GI w/u -Eliquis for atrial fibrillation is on hold  4. CAP:  -On antibiotics for atypical PNA -Blood cultures pending   5. Chronic Atrial Fibrillation:  -Rate is controlled with metoprolol 100 and Cardizem  30 -Eliquis is on hold for worsening anemia, w/ drop in Hgb from 11>8.8. FOBT + -Will need to determine if anticoagulation can be restarted in the future based on GI w/u.  -CHA2DS2 VASc score is at least 7 for CHF, HTN, Age > 41, h/o TIA and female sex. This patients CHA2DS2-VASc Score and unadjusted Ischemic Stroke Rate (% per year) is equal to 11.2 % stroke rate/year from a score of 7  6. Acute on Chronic Diastolic HF:  -Per Echo 11/4560 which showed normal LVEF -BNP this admit abnormal at 478> treated with IV lasix 20mg  BID  -Strict I/Os and daily weights, low sodium diet  -Renal function is stable>>cr, 1.26 today   7. Mitral Regurgitation: -Moderate by recent echo. Not severe enough to be considered for MV clip at that time.   8. H/o LE DVT:  -Pt reports DVT occurred post TKR several years ago -Eliquis currently on hold given suspected GIB -Continue with SCDs.    Signed, Kathyrn Drown NP-C [facility] Pager: [phone] 10/28/2017, 7:00 AM    For questions or updates, please contact   Please consult [email or web address] for contact info under Cardiology/STEMI.  The patient was seen, examined and discussed with Brittainy M. Rosita Fire, PA-C and I agree with the above.   84y.o.female with known CAD s/p BMS to RCA x 2 in 8768, diastolic CHF, mitral regurgitation, HTN, HLD, persistent afib on Eliquis, chronic dyspnea on exertion, COPD, pulmonary HTN, prior TIA, prior DVT and h/o colon CA. The patient had a recent nuclear stress test in Feb 2019 with mid to apical anterior wall ischemia.  She was treated medically as her symptoms improved.  She presented yesterday with chest tightness 5/10, that is worse on exertion, also productive cough and SOB. She was diagnosed with atypical pneumonia, started on ceftriaxone and Zithromax. Blood cultures pending. She was also felt to be in acute on chronic diastolic HF. BNP was abnormal at 478. CXR did not show edema, however she has crackles on physical  exam. Pt placed on IV Lasix. EKG showed atrial fibrillation w/ CVR and inferior ST abnormalities. Troponin's are negative x3. Hgb has dropped since January, down from 11.9 --> 8.8. She has positive FOBT. Complaining of significant leg cramping since yesterday.  Assessment: Demand ischemia in the settings of known anterior ischemia, anemia and atypical pneumonia  Plan: Continue iv lasix 20 mg iv BID, she has diuresed with improved LE edema Give low dose tramadol for pain Consider blood transfusion to keep Hb>9 EGD today, we will follow Possible cath in the future once anemia bleeding resolved   Ena Dawley, MD 10/28/2017

## 2017-10-29 ENCOUNTER — Encounter (HOSPITAL_COMMUNITY): Payer: Self-pay | Admitting: Gastroenterology

## 2017-10-29 DIAGNOSIS — K259 Gastric ulcer, unspecified as acute or chronic, without hemorrhage or perforation: Secondary | ICD-10-CM

## 2017-10-29 DIAGNOSIS — I25118 Atherosclerotic heart disease of native coronary artery with other forms of angina pectoris: Secondary | ICD-10-CM

## 2017-10-29 DIAGNOSIS — K279 Peptic ulcer, site unspecified, unspecified as acute or chronic, without hemorrhage or perforation: Secondary | ICD-10-CM

## 2017-10-29 LAB — CBC WITH DIFFERENTIAL/PLATELET
Basophils Absolute: 0 10*3/uL (ref 0.0–0.1)
Basophils Relative: 0 %
Eosinophils Absolute: 0.1 10*3/uL (ref 0.0–0.7)
Eosinophils Relative: 2 %
HCT: 33.2 % — ABNORMAL LOW (ref 36.0–46.0)
Hemoglobin: 9.8 g/dL — ABNORMAL LOW (ref 12.0–15.0)
Lymphocytes Relative: 17 %
Lymphs Abs: 1.1 10*3/uL (ref 0.7–4.0)
MCH: 25.7 pg — ABNORMAL LOW (ref 26.0–34.0)
MCHC: 29.5 g/dL — ABNORMAL LOW (ref 30.0–36.0)
MCV: 87.1 fL (ref 78.0–100.0)
Monocytes Absolute: 0.4 10*3/uL (ref 0.1–1.0)
Monocytes Relative: 7 %
Neutro Abs: 4.7 10*3/uL (ref 1.7–7.7)
Neutrophils Relative %: 74 %
Platelets: 136 10*3/uL — ABNORMAL LOW (ref 150–400)
RBC: 3.81 MIL/uL — ABNORMAL LOW (ref 3.87–5.11)
RDW: 18.3 % — ABNORMAL HIGH (ref 11.5–15.5)
WBC: 6.3 10*3/uL (ref 4.0–10.5)

## 2017-10-29 LAB — BPAM RBC
Blood Product Expiration Date: 201904232359
ISSUE DATE / TIME: 201904161801
Unit Type and Rh: 6200

## 2017-10-29 LAB — TYPE AND SCREEN
ABO/RH(D): A POS
Antibody Screen: NEGATIVE
Unit division: 0

## 2017-10-29 LAB — BASIC METABOLIC PANEL
Anion gap: 8 (ref 5–15)
BUN: 27 mg/dL — ABNORMAL HIGH (ref 6–20)
CO2: 30 mmol/L (ref 22–32)
Calcium: 8.5 mg/dL — ABNORMAL LOW (ref 8.9–10.3)
Chloride: 102 mmol/L (ref 101–111)
Creatinine, Ser: 1.15 mg/dL — ABNORMAL HIGH (ref 0.44–1.00)
GFR calc Af Amer: 49 mL/min — ABNORMAL LOW (ref >60)
GFR calc non Af Amer: 42 mL/min — ABNORMAL LOW (ref >60)
Glucose, Bld: 98 mg/dL (ref 65–99)
Potassium: 4 mmol/L (ref 3.5–5.1)
Sodium: 140 mmol/L (ref 135–145)

## 2017-10-29 LAB — HEMOGLOBIN AND HEMATOCRIT, BLOOD
HCT: 35.8 % — ABNORMAL LOW (ref 36.0–46.0)
Hemoglobin: 10.3 g/dL — ABNORMAL LOW (ref 12.0–15.0)

## 2017-10-29 LAB — PROTIME-INR
INR: 1.28
Prothrombin Time: 15.9 seconds — ABNORMAL HIGH (ref 11.4–15.2)

## 2017-10-29 MED ORDER — SODIUM CHLORIDE 0.9 % IV SOLN
510.0000 mg | Freq: Once | INTRAVENOUS | Status: AC
  Administered 2017-10-29: 510 mg via INTRAVENOUS
  Filled 2017-10-29: qty 17

## 2017-10-29 MED ORDER — VITAMIN C 500 MG PO TABS
500.0000 mg | ORAL_TABLET | ORAL | Status: DC
  Administered 2017-10-31: 500 mg via ORAL
  Filled 2017-10-29: qty 1

## 2017-10-29 MED ORDER — LEVALBUTEROL HCL 0.63 MG/3ML IN NEBU
0.6300 mg | INHALATION_SOLUTION | Freq: Four times a day (QID) | RESPIRATORY_TRACT | Status: DC
  Administered 2017-10-29 – 2017-10-30 (×4): 0.63 mg via RESPIRATORY_TRACT
  Filled 2017-10-29 (×4): qty 3

## 2017-10-29 MED ORDER — DEXTROSE 5 % IV SOLN
5.0000 mg | Freq: Once | INTRAVENOUS | Status: AC
  Administered 2017-10-29: 5 mg via INTRAVENOUS
  Filled 2017-10-29: qty 0.5

## 2017-10-29 MED ORDER — IPRATROPIUM BROMIDE 0.02 % IN SOLN
0.5000 mg | Freq: Four times a day (QID) | RESPIRATORY_TRACT | Status: DC
  Administered 2017-10-29 – 2017-10-30 (×4): 0.5 mg via RESPIRATORY_TRACT
  Filled 2017-10-29 (×4): qty 2.5

## 2017-10-29 MED ORDER — ISOSORBIDE MONONITRATE ER 30 MG PO TB24
15.0000 mg | ORAL_TABLET | Freq: Every day | ORAL | Status: DC
  Administered 2017-10-29 – 2017-10-31 (×3): 15 mg via ORAL
  Filled 2017-10-29 (×3): qty 1

## 2017-10-29 MED ORDER — FERROUS SULFATE 325 (65 FE) MG PO TABS
325.0000 mg | ORAL_TABLET | Freq: Two times a day (BID) | ORAL | Status: DC
  Administered 2017-10-30 – 2017-10-31 (×3): 325 mg via ORAL
  Filled 2017-10-29 (×4): qty 1

## 2017-10-29 MED ORDER — VITAMIN C 500 MG PO TABS: 1000.0000 mg | ORAL_TABLET | ORAL | Status: DC

## 2017-10-29 NOTE — Progress Notes (Addendum)
Progress Note  Patient Name: [name] Date of Encounter: 10/29/2017  Primary Cardiologist: Dr. Grayland Jack  Subjective   Feeling okay today. Continues to have mild chest discomfort. Has c/o upper and lower extremity "jerking" which is normal for her after anesthesia (endoscopy yesterday).   Inpatient Medications    Scheduled Meds: . azithromycin  500 mg Oral Daily  . diltiazem  30 mg Oral Q8H  . docusate sodium  100 mg Oral BID  . ferrous sulfate  325 mg Oral TID WC  . furosemide  20 mg Intravenous BID  . gabapentin  600 mg Oral BID  . metoprolol tartrate  100 mg Oral BID  . pantoprazole  40 mg Oral BID  . polyethylene glycol  17 g Oral Daily  . sertraline  50 mg Oral Daily  . traZODone  50 mg Oral QHS  . vitamin C  1,000 mg Oral Daily   Continuous Infusions:  PRN Meds: guaiFENesin-dextromethorphan, HYDROcodone-acetaminophen, ipratropium, levalbuterol, LORazepam, ondansetron **OR** ondansetron (ZOFRAN) IV, traMADol   Vital Signs    Vitals:   10/28/17 2100 10/29/17 0028 10/29/17 0430 10/29/17 0550  BP: 126/72 110/65 (!) 146/103 (!) 148/81  Pulse: 71 72 84   Resp: 16 16 19    Temp: 98.2 F (36.8 C) 97.7 F (36.5 C) 98.2 F (36.8 C)   TempSrc: Oral Oral Oral   SpO2: 100% 99% 98%   Weight:   187 lb 9.8 oz (85.1 kg)   Height:        Intake/Output Summary (Last 24 hours) at 10/29/2017 0738 Last data filed at 10/29/2017 0552 Gross per 24 hour  Intake 905 ml  Output 1650 ml  Net -745 ml   Filed Weights   10/27/17 0615 10/28/17 0429 10/29/17 0430  Weight: 187 lb 11.2 oz (85.1 kg) 189 lb 4.8 oz (85.9 kg) 187 lb 9.8 oz (85.1 kg)    Physical Exam    General: Elderly, NAD Skin: Warm, dry, intact  Head: Normocephalic, atraumatic, clear, moist mucus membranes. Neck: Negative for carotid bruits. No JVD Lungs:Clear to ausculation bilaterally. No wheezes, rales, or rhonchi. Breathing is unlabored. Cardiovascular: Irregularly irregular with S1 S2. No  murmurs, rubs or  Gallops Abdomen: Soft, non-tender, non-distended with normoactive bowel sounds. No obvious abdominal masses. MSK: Strength and tone appear normal for age. 5/5 in all extremities Extremities: No edema. No clubbing or cyanosis. DP/PT pulses 2+ bilaterally Neuro: Alert and oriented. No focal deficits. No facial asymmetry. MAE spontaneously. Psych: Responds to questions appropriately with normal affect.    Labs    Chemistry Recent Labs  Lab 10/26/17 1230 10/27/17 0610 10/28/17 0900  NA 141 140 139  K 4.3 3.6 4.5  CL 105 103 104  CO2 25 26 27   GLUCOSE 105* 174* 97  BUN 38* 31* 33*  CREATININE 1.34* 1.26* 1.28*  CALCIUM 9.2 8.3* 8.6*  PROT  --  6.2* 6.1*  ALBUMIN  --  3.1* 3.1*  AST  --  31 34  ALT  --  19 22  ALKPHOS  --  42 40  BILITOT  --  0.7 0.6  GFRNONAA 35* 38* 37*  GFRAA 41* 44* 43*  ANIONGAP 11 11 8      Hematology Recent Labs  Lab 10/27/17 0610 10/27/17 1136 10/28/17 0900 10/29/17 0001  WBC 7.1 10.8* 10.6*  --   RBC 3.31* 3.51*  3.51* 3.34*  --   HGB 8.3* 8.8* 8.2* 10.3*  HCT 28.1* 29.9* 28.8* 35.8*  MCV 84.9  85.2 86.2  --   MCH 25.1* 25.1* 24.6*  --   MCHC 29.5* 29.4* 28.5*  --   RDW 18.1* 18.4* 18.3*  --   PLT 131* 143* 142*  --     Cardiac Enzymes Recent Labs  Lab 10/27/17 0048 10/27/17 0610 10/27/17 1136  TROPONINI <0.03 <0.03 <0.03    Recent Labs  Lab 10/26/17 1323  TROPIPOC 0.02     BNP Recent Labs  Lab 10/26/17 1230  BNP 478.6*     DDimer No results for input(s): DDIMER in the last 168 hours.   Radiology    Ct Abdomen Pelvis W Contrast  Result Date: 10/27/2017 CLINICAL DATA:  Subacute onset of generalized weakness, nausea and vomiting. EXAM: CT ABDOMEN AND PELVIS WITH CONTRAST TECHNIQUE: Multidetector CT imaging of the abdomen and pelvis was performed using the standard protocol following bolus administration of intravenous contrast. CONTRAST:  147mL ISOVUE-300 IOPAMIDOL (ISOVUE-300) INJECTION 61% COMPARISON:   CT of the abdomen and pelvis from 07/22/2017 FINDINGS: Lower chest: Mild hazy right basilar airspace opacity Barney reflect mild asymmetric interstitial edema or possibly pneumonia. A moderate hiatal hernia is noted. Scattered coronary artery calcifications are seen. Hepatobiliary: The liver is unremarkable in appearance. The patient is status post cholecystectomy, with clips noted at the gallbladder fossa. The common bile duct remains normal in caliber. Pancreas: The pancreas is within normal limits. Spleen: The spleen is unremarkable in appearance. Adrenals/Urinary Tract: The adrenal glands are unremarkable in appearance. Nonspecific perinephric stranding is noted bilaterally. A small left renal cyst is noted. There is no evidence of hydronephrosis. No renal or ureteral stones are identified. Stomach/Bowel: The patient is status post resection of the cecum. The ileocolic anastomosis is unremarkable in appearance. Bowel suture lines are noted at the sigmoid colon. The small bowel is grossly unremarkable in appearance. The stomach is otherwise unremarkable in appearance. Vascular/Lymphatic: Scattered calcification is seen along the abdominal aorta and its branches. The abdominal aorta is otherwise grossly unremarkable. The inferior vena cava is grossly unremarkable. No retroperitoneal lymphadenopathy is seen. No pelvic sidewall lymphadenopathy is identified. Reproductive: The bladder is mildly distended and grossly unremarkable. The patient is status post hysterectomy. No suspicious adnexal masses are seen. Other: No additional soft tissue abnormalities are seen. Musculoskeletal: No acute osseous abnormalities are identified. Multilevel vacuum phenomenon is noted along the lumbar spine. The visualized musculature is unremarkable in appearance. IMPRESSION: 1. Mild hazy right basilar airspace opacity [place] reflect mild asymmetric interstitial edema or possibly pneumonia. 2. Moderate hiatal hernia. 3. Scattered coronary  artery calcifications. 4. Small left renal cyst. Aortic Atherosclerosis (ICD10-I70.0). Electronically Signed   By: Garald Balding M.D.   On: 10/27/2017 23:23    Telemetry    10/29/17 Atrial fibrillation HR 91 - Personally Reviewed  ECG    No new tracings as of 10/29/17 - Personally Reviewed  Cardiac Studies   Echocardiogram 10/28/17: Study Conclusions  - Left ventricle: The cavity size was normal. There was severe   focal basal and moderate concentric hypertrophy. Systolic   function was normal. The estimated ejection fraction was in the   range of 55% to 60%. Wall motion was normal; there were no   regional wall motion abnormalities. The study was not technically   sufficient to allow evaluation of LV diastolic dysfunction due to   atrial fibrillation. - Aortic valve: Mildly to moderately calcified annulus. Trileaflet;   normal thickness, mildly calcified leaflets. - Mitral valve: There was moderate regurgitation. - Left atrium: The atrium was severely dilated. -  Tricuspid valve: There was moderate regurgitation. - Pulmonic valve: There was trivial regurgitation. - Pulmonary arteries: PA peak pressure: 50 mm Hg (S).  Impressions:  - The right ventricular systolic pressure was increased consistent   with moderate pulmonary hypertension.   2D Echo 08/15/17 Study Conclusions  - Left ventricle: The cavity size was normal. There was mild focal basal hypertrophy of the septum. Systolic function was normal. The estimated ejection fraction was in the range of 55% to 60%. Wall motion was normal; there were no regional wall motion abnormalities. The study is not technically sufficient to allow evaluation of LV diastolic function. - Aortic valve: Trileaflet; mildly thickened, mildly calcified leaflets. - Mitral valve: There was moderate regurgitation. - Left atrium: The atrium was severely dilated. - Right atrium: The atrium was severely dilated. - Tricuspid  valve: There was moderate regurgitation. - Pulmonary arteries: Systolic pressure was moderately increased. PA peak pressure: 54 mm Hg (S).  NST 08/15/17 Study Highlights    Nuclear stress EF: 52%.  Small region of moderate ischemia in the distal anterior, anterolateral, septal and apical walls. Cannot exclude some shifting breast attenuation.  There was no ST segment deviation noted during stress.  This is an intermediate risk study.     Patient Profile     84 y.o. female with a hx of CAD s/p BMS to RCA x 2 in 5188, diastolic CHF,mitral regurgitation,HTN, HLD, persistent afib on Eliquis, chronic dyspnea on exertion, COPD, pulmonary HTN, prior TIA, prior DVT and h/ocolon CAwho was admitted overnight for CAP and acute on chronic diastolic HF, also found to be anemic and + FOBT. She is being seen today forthe evaluation ofchest pain,at the request of Dr. Posey Pronto, Internal Medicine.  Assessment & Plan    1. Chest Pain:  -Continues to have mild CP this AM  -Trop, <0.03, <0.03, <0.03 -EKG without acute ST-T wave changes, atrial fibrillation  -Could consider cath once anemia stabilized>>GI consulted for endoscopy for possible upper GI tract bleeding, 10/28/17 -Can resume Eliquis per GI a few days post procedure if no obvious bleeding or drop in HB -Will need to consider cath once able to anticoagulate   2. Anemia: -GI consulted 10/27/17>>>endoscopy completed 10/28/17 with superficial small gastric ulcers with no high risk stigmata of bleeding>>>H. Pylori samples taken with pending results -Hgb 10.3 today  -FOBT is positive on admission  -Eliquis is now on hold for endoscopy>>can resume in a few days per GI OP report if no significant drop in HB or obvious bleeding   3. CAD:  -h/oBMS to RCA x 2 in 2009. Recent NST 08/2017 showed a small area of moderate ischemia in the distal anterior and anterolateral, septal and apical walls. [name] could not exclude some shifting breast  attenuation. The study was felt to be intermediate risk, however medical therapy was elected as pt only with exertional symptoms with moderate to heavy activity. She has had recent CP with slight ST depressions in the inferior leads, but this Oren be demand ischemia 2/2 worsening anemia.  -Given her anemia and FOBT is positive, she is currently not a candidate for cath -Recommend continuation of medical therapy -Continue BB with the addition statin -No ASA until further GI w/u -Eliquis for atrial fibrillation is on hold>>resume several days post procedure   4. CAP:  -On antibiotics for atypical PNA -Blood cultures pending   5. Chronic Atrial Fibrillation:  -Rate is controlled with metoprolol 100 and Cardizem 30 -Eliquis is on hold for worsening anemia, w/drop in  Hgb from 11>8.8.FOBT +>>>more stable today at 10.3 -Eliquis ok to resume several days post procedure if no acute bleeding occurs>>endoscopy procedure on 10/28/17 -CHA2DS2 VASc scoreis at least 7 for CHF, HTN, Age > 53, h/o TIA and female sex.This patients CHA2DS2-VASc Score and unadjusted Ischemic Stroke Rate (% per year) is equal to11.2 % stroke rate/year from a score of 7  6. Acute on Chronic Diastolic HF:  -Per Echo 12/2829 which showed normal LVEF>>>echo 10/28/17 with moderate pulmonary HTN -BNP this admit abnormal at 478> treated with IV lasix 20mg  BID  -Strict I/Os and daily weights, low sodium diet  -Renal function is stable>>no BMET as of 5176 this AM -Wt, 187lb, down 2lb from 10/28/17 -I&O, net negative 142ml, total 24H output 1.6L  7. Mitral Regurgitation: -Moderate by recent echo. Not severe enough to be considered for MV clip at that time.  8. H/o LE DVT:  -Pt reports DVT occurred post TKR several years ago -Eliquis currently on hold given suspected GIB -Continue with SCDs.   Signed, Kathyrn Drown NP-C [facility] Pager: [phone] 10/29/2017, 7:38 AM     For questions or updates, please contact    Please consult [email or web address] for contact info under Cardiology/STEMI.  The patient was seen, examined and discussed with Kathyrn Drown, NP  and I agree with the above.   Post EGD yesterday, small non-bleeding gastric ulcer, she was started on protonix BID, she has on and off sharp chest pains that are atypical and typical.  I will add Imdur 15 mg po daily, start physical therapy. Given that she needs long term anticoagulation a cath and possible DAPT with Eliquis would be a very high risk for bleeding.   Ena Dawley, MD 10/29/2017

## 2017-10-29 NOTE — Progress Notes (Signed)
[facility] Progress Note  Patient: [name] DJM:[id number]   PCP: Marletta Lor, MD DOB: 12/19/1929   DOA: 10/26/2017   DOS: 10/29/2017   Date of Service: the patient was seen and examined on 10/29/2017  Subjective: Still has chest pain as well as mild shortness of breath.  No nausea no vomiting.  Oral intake is improving.  Bowel movement without any blood.  Brief hospital course: Pt. with PMH of  CAD, diastolic CHF, hypertension, chronic atrial fibrillation on anticoagulation with Eliquis, GERD, hyperlipidemia; admitted on 10/26/2017, presented with complaint of fatigue, was found to have acute on chronic diastolic CHF and intractable nausea and vomiting Currently further plan is control of chest pain.  Assessment and Plan: Principal Problem:   Acute on chronic diastolic CHF (congestive heart failure) (HCC)   Abnormal stress test   Moderate mitral regurgitation   Coronary artery disease of native artery of native heart with stable angina pectoris ([place])   Essential hypertension   Acute hypoxic respiratory failure Presents with complaints of shortness of breath although reported as ongoing since last 1 week but per patient progressively worsening over last few months. Presented with 86% on room air saturation, 100% on 2 L of oxygen, oxygenation improving with diuresis. echocardiogram February 2019 shows EF of 55-60%, moderate MR. Recent outpatient nuclear stress test is intermediate risk as well with ischemia in mid anterior, apical area. Currently chest pain off and on, both typical and atypical. Troponins negative. CT chest PE protocol on admission is negative for pulmonary embolism. Shows groundglass density which appears more likely CHF rather than atypical pneumonia to me. Patient denies any significant signs of infection at home, no leukocytosis here. Started on IV Lasix, will continue it for now. Currently on Cardizem, metoprolol and Lasix. Appreciate cardiology  input. S/P 1 PRBC based on the recommendation to maintain hemoglobin more than 9. Cath in future once anemia and bleeding is resolved.    Chronic atrial fibrillation (HCC)   Chronic anticoagulation Continue metoprolol and Cardizem. Hold Eliquis in the setting of possible GI bleed.  Based on GI recommendation it appears that patient can be started on this medication tomorrow.  Would ideally watch hemoglobin while the patient is receiving anticoagulation.    GERD   Lethargy ?  Hematemesis, iron deficiency anemia Primary complaint is fatigue for patient. Has history of GERD. On PPI. Hemoglobin dropped from 11-8. Patient reported coffee colored emesis. No blood noted here although Hemoccult positive. Has a history of colon cancer causing iron deficiency anemia. Iron level low, will replace. Transfuse for hemoglobin less than 9 per cardiology. GI consulted.  S/P EGD shows superficial ulcer possible site of bleeding without any active bleeding at present. Oral PPI twice a day, GI currently signed off. Advancing diet to heart healthy diet. S/P IV iron infusion x1 as well.    Cancer of ascending colon (HCC)   Nausea and vomiting Patient has significant abdominal pain on left lower quadrant as well as ongoing nausea as well as multivessel episode of vomiting at home. CT scan of the abdomen performed, tolerated well.  No evidence of acute obstruction or other acute abnormality.    CAP (community acquired pneumonia) Presented with shortness of breath, get a CT chest which was reported as a possible atypical pneumonia. Although it appears that patient has more evidence of acute on chronic CHF presentation rather than pneumonia. oral azithromycin finish total 5-day course.    Thrombocytopenia (HCC)    Iron deficiency anemia We will  start the patient on iron supplementation. Platelets are relatively low, but stable will monitor.    Anxiety and depression Continue Zoloft, trazodone,  Ativan, gabapentin  Diet: Heart healthy diet DVT Prophylaxis: mechanical compression device (apixaban on hold)  Advance goals of care discussion: full code  Family Communication: family was present at bedside, at the time of interview. The pt provided permission to discuss medical plan with the family. Opportunity was given to ask question and all questions were answered satisfactorily.   Disposition:  Discharge to be determined.  PT consulted  Consultants: cardiology, gastroenterology. Procedures: EGD, echocardiogram  Antibiotics: Anti-infectives (From admission, onward)   Start     Dose/Rate Route Frequency Ordered Stop   10/29/17 1000  azithromycin (ZITHROMAX) tablet 500 mg     500 mg Oral Daily 10/28/17 1635 11/01/17 0959   10/27/17 2200  cefTRIAXone (ROCEPHIN) 1 g in sodium chloride 0.9 % 100 mL IVPB  Status:  Discontinued     1 g 200 mL/hr over 30 Minutes Intravenous Every 24 hours 10/26/17 2203 10/27/17 1135   10/27/17 1600  azithromycin (ZITHROMAX) 500 mg in sodium chloride 0.9 % 250 mL IVPB  Status:  Discontinued     500 mg 250 mL/hr over 60 Minutes Intravenous Every 24 hours 10/26/17 2335 10/28/17 1635   10/26/17 2130  cefTRIAXone (ROCEPHIN) 1 g in sodium chloride 0.9 % 100 mL IVPB     1 g 200 mL/hr over 30 Minutes Intravenous  Once 10/26/17 2119 10/26/17 2243   10/26/17 2130  azithromycin (ZITHROMAX) tablet 500 mg  Status:  Discontinued     500 mg Oral  Once 10/26/17 2119 10/26/17 2119   10/26/17 2130  azithromycin (ZITHROMAX) 500 mg in sodium chloride 0.9 % 250 mL IVPB     500 mg 250 mL/hr over 60 Minutes Intravenous  Once 10/26/17 2119 10/26/17 2254       Objective: Physical Exam: Vitals:   10/29/17 1014 10/29/17 1126 10/29/17 1213 10/29/17 1536  BP: 110/70  102/65   Pulse: 72 79 78 78  Resp:  18 18 20   Temp:   98.2 F (36.8 C)   TempSrc:   Oral   SpO2:  95% 94% 91%  Weight:      Height:        Intake/Output Summary (Last 24 hours) at 10/29/2017  1650 Last data filed at 10/29/2017 1300 Gross per 24 hour  Intake 1932 ml  Output 1750 ml  Net 182 ml   Filed Weights   10/27/17 0615 10/28/17 0429 10/29/17 0430  Weight: 85.1 kg (187 lb 11.2 oz) 85.9 kg (189 lb 4.8 oz) 85.1 kg (187 lb 9.8 oz)   General: Alert, Awake and Oriented to Time, Place and Person. Appear in marked distress, affect appropriate Eyes: PERRL, Conjunctiva normal ENT: Oral Mucosa clear moisty. Neck: difficult to assess JVD, no Abnormal Mass Or lumps Cardiovascular: S1 and S2 Present, aortic systolic Murmur, Peripheral Pulses Present Respiratory: normal respiratory effort, Bilateral Air entry equal and Decreased, no use of accessory muscle, Clear to Auscultation, no Crackles, no wheezes Abdomen: Bowel Sound present, Soft and mild LUQ tenderness, no hernia Skin: no redness, no Rash, no induration Extremities: bilateral Pedal edema, no calf tenderness Neurologic: Grossly no focal neuro deficit. Bilaterally Equal motor strength  Data Reviewed: CBC: Recent Labs  Lab 10/26/17 1230 10/27/17 0610 10/27/17 1136 10/28/17 0900 10/29/17 0001 10/29/17 0801  WBC 9.9 7.1 10.8* 10.6*  --  6.3  NEUTROABS  --   --   --   --   --  4.7  HGB 9.6* 8.3* 8.8* 8.2* 10.3* 9.8*  HCT 31.9* 28.1* 29.9* 28.8* 35.8* 33.2*  MCV 83.7 84.9 85.2 86.2  --  87.1  PLT 135* 131* 143* 142*  --  491*   Basic Metabolic Panel: Recent Labs  Lab 10/26/17 1230 10/27/17 0610 10/28/17 0900 10/29/17 0801  NA 141 140 139 140  K 4.3 3.6 4.5 4.0  CL 105 103 104 102  CO2 25 26 27 30   GLUCOSE 105* 174* 97 98  BUN 38* 31* 33* 27*  CREATININE 1.34* 1.26* 1.28* 1.15*  CALCIUM 9.2 8.3* 8.6* 8.5*  MG  --  1.9  --   --     Liver Function Tests: Recent Labs  Lab 10/27/17 0610 10/28/17 0900  AST 31 34  ALT 19 22  ALKPHOS 42 40  BILITOT 0.7 0.6  PROT 6.2* 6.1*  ALBUMIN 3.1* 3.1*   No results for input(s): LIPASE, AMYLASE in the last 168 hours. No results for input(s): AMMONIA in the last  168 hours. Coagulation Profile: Recent Labs  Lab 10/29/17 1147  INR 1.28   Cardiac Enzymes: Recent Labs  Lab 10/27/17 0048 10/27/17 0610 10/27/17 1136  TROPONINI <0.03 <0.03 <0.03   BNP (last 3 results) Recent Labs    08/04/17 1222  PROBNP 366.0*   CBG: No results for input(s): GLUCAP in the last 168 hours. Studies: No results found.  Scheduled Meds: . azithromycin  500 mg Oral Daily  . diltiazem  30 mg Oral Q8H  . docusate sodium  100 mg Oral BID  . [START ON 10/30/2017] ferrous sulfate  325 mg Oral BID WC  . furosemide  20 mg Intravenous BID  . gabapentin  600 mg Oral BID  . ipratropium  0.5 mg Nebulization QID  . isosorbide mononitrate  15 mg Oral Daily  . levalbuterol  0.63 mg Nebulization QID  . metoprolol tartrate  100 mg Oral BID  . pantoprazole  40 mg Oral BID  . polyethylene glycol  17 g Oral Daily  . sertraline  50 mg Oral Daily  . traZODone  50 mg Oral QHS  . [START ON 10/31/2017] vitamin C  500 mg Oral Once per day on Mon Wed Fri   Continuous Infusions:  PRN Meds: guaiFENesin-dextromethorphan, HYDROcodone-acetaminophen, LORazepam, ondansetron **OR** ondansetron (ZOFRAN) IV, traMADol  Time spent: 35 minutes  Author: Berle Mull, MD [facility] Pager: [phone] 10/29/2017 4:50 PM  If 7PM-7AM, please contact night-coverage at [email or web address], password [facility]

## 2017-10-29 NOTE — Progress Notes (Signed)
Daily Rounding Note  10/29/2017, 11:57 AM  LOS: 3 days   SUBJECTIVE:   Chief complaint: several loose stools.  Pain in multiple locations, 1 is under neath lower left anterior rib: this is not new.   Earlier was having uncontrolled jerking of limbs, she gets this every time she is sedated for procedures and last year after colonoscopy, the jerking caused her to fall and break her ankle (right Talus, non-op mgt) Still on clears.        OBJECTIVE:         Vital signs in last 24 hours:    Temp:  [97.5 F (36.4 C)-98.2 F (36.8 C)] 98.2 F (36.8 C) (04/17 0430) Pulse Rate:  [56-84] 79 (04/17 1126) Resp:  [15-20] 18 (04/17 1126) BP: (97-148)/(52-103) 110/70 (04/17 1014) SpO2:  [95 %-100 %] 95 % (04/17 1126) Weight:  [187 lb 9.8 oz (85.1 kg)] 187 lb 9.8 oz (85.1 kg) (04/17 0430) Last BM Date: 10/28/17 Filed Weights   10/27/17 0615 10/28/17 0429 10/29/17 0430  Weight: 187 lb 11.2 oz (85.1 kg) 189 lb 4.8 oz (85.9 kg) 187 lb 9.8 oz (85.1 kg)   General: pleasant, looks tired and ill.     Heart: RRR rate in 70s on tele Chest: clear bil.   Abdomen: soft, ND.  Active BS.  Mild LUQ tenderness up under the rib  Extremities: no CCE Neuro/Psych:  Alert, oriented x 3.  No tremors  Intake/Output from previous day: 04/16 0701 - 04/17 0700 In: 905 [P.O.:240; I.V.:100; Blood:315; IV Piggyback:250] Out: 2703 [Urine:1650]  Intake/Output this shift: Total I/O In: 480 [P.O.:480] Out: 600 [Urine:600]  Lab Results: Recent Labs    10/27/17 1136 10/28/17 0900 10/29/17 0001 10/29/17 0801  WBC 10.8* 10.6*  --  6.3  HGB 8.8* 8.2* 10.3* 9.8*  HCT 29.9* 28.8* 35.8* 33.2*  PLT 143* 142*  --  136*   BMET Recent Labs    10/27/17 0610 10/28/17 0900 10/29/17 0801  NA 140 139 140  K 3.6 4.5 4.0  CL 103 104 102  CO2 26 27 30   GLUCOSE 174* 97 98  BUN 31* 33* 27*  CREATININE 1.26* 1.28* 1.15*  CALCIUM 8.3* 8.6* 8.5*    LFT Recent Labs    10/27/17 0610 10/28/17 0900  PROT 6.2* 6.1*  ALBUMIN 3.1* 3.1*  AST 31 34  ALT 19 22  ALKPHOS 42 40  BILITOT 0.7 0.6   PT/INR No results for input(s): LABPROT, INR in the last 72 hours. Hepatitis Panel No results for input(s): HEPBSAG, HCVAB, HEPAIGM, HEPBIGM in the last 72 hours.  Studies/Results: Ct Abdomen Pelvis W Contrast  Result Date: 10/27/2017 CLINICAL DATA:  Subacute onset of generalized weakness, nausea and vomiting. EXAM: CT ABDOMEN AND PELVIS WITH CONTRAST TECHNIQUE: Multidetector CT imaging of the abdomen and pelvis was performed using the standard protocol following bolus administration of intravenous contrast. CONTRAST:  166mL ISOVUE-300 IOPAMIDOL (ISOVUE-300) INJECTION 61% COMPARISON:  CT of the abdomen and pelvis from 07/22/2017 FINDINGS: Lower chest: Mild hazy right basilar airspace opacity [place] reflect mild asymmetric interstitial edema or possibly pneumonia. A moderate hiatal hernia is noted. Scattered coronary artery calcifications are seen. Hepatobiliary: The liver is unremarkable in appearance. The patient is status post cholecystectomy, with clips noted at the gallbladder fossa. The common bile duct remains normal in caliber. Pancreas: The pancreas is within normal limits. Spleen: The spleen is unremarkable in appearance. Adrenals/Urinary Tract: The adrenal glands are unremarkable in appearance.  Nonspecific perinephric stranding is noted bilaterally. A small left renal cyst is noted. There is no evidence of hydronephrosis. No renal or ureteral stones are identified. Stomach/Bowel: The patient is status post resection of the cecum. The ileocolic anastomosis is unremarkable in appearance. Bowel suture lines are noted at the sigmoid colon. The small bowel is grossly unremarkable in appearance. The stomach is otherwise unremarkable in appearance. Vascular/Lymphatic: Scattered calcification is seen along the abdominal aorta and its branches. The abdominal  aorta is otherwise grossly unremarkable. The inferior vena cava is grossly unremarkable. No retroperitoneal lymphadenopathy is seen. No pelvic sidewall lymphadenopathy is identified. Reproductive: The bladder is mildly distended and grossly unremarkable. The patient is status post hysterectomy. No suspicious adnexal masses are seen. Other: No additional soft tissue abnormalities are seen. Musculoskeletal: No acute osseous abnormalities are identified. Multilevel vacuum phenomenon is noted along the lumbar spine. The visualized musculature is unremarkable in appearance. IMPRESSION: 1. Mild hazy right basilar airspace opacity [place] reflect mild asymmetric interstitial edema or possibly pneumonia. 2. Moderate hiatal hernia. 3. Scattered coronary artery calcifications. 4. Small left renal cyst. Aortic Atherosclerosis (ICD10-I70.0). Electronically Signed   By: Garald Balding M.D.   On: 10/27/2017 23:23   Scheduled Meds: . azithromycin  500 mg Oral Daily  . diltiazem  30 mg Oral Q8H  . docusate sodium  100 mg Oral BID  . ferrous sulfate  325 mg Oral TID WC  . furosemide  20 mg Intravenous BID  . gabapentin  600 mg Oral BID  . ipratropium  0.5 mg Nebulization QID  . levalbuterol  0.63 mg Nebulization QID  . metoprolol tartrate  100 mg Oral BID  . pantoprazole  40 mg Oral BID  . polyethylene glycol  17 g Oral Daily  . sertraline  50 mg Oral Daily  . traZODone  50 mg Oral QHS  . vitamin C  1,000 mg Oral Daily   Continuous Infusions: . phytonadione (VITAMIN K) IV     PRN Meds:.guaiFENesin-dextromethorphan, HYDROcodone-acetaminophen, LORazepam, ondansetron **OR** ondansetron (ZOFRAN) IV, traMADol  ASSESMENT:   *  Anemia, FOBT+ in setting of chronic Eliquis.  Low iron and iron sats, ferritin wn   Hgb improved post 1 U PRBC.   4/16 EGD: HH, non-bleeding superficial gastric ulcers, clo-test pending.  Ulcers in setting of Eliquis are likely source of bleeding/anemia.    On TID iron, 1 x daily PTA.  Got  feraheme today.  On Nexium PTA,  Now on Protonix BID.  [place] have been using Aleve PTA.  *   Thrombocytopenia.  Chronic, intermittent, dates to 2012.      *  Hx obstructing right colon cancer 05/2014.  S/p right hemicolectomy and diverting colostomy, reversed in 11/2014.   12/2016 Colonoscopy; 2 polyps (TAs) and int rrhoids.      *   Adverse response to sedation meds.     PLAN   *   Ok to start Eliquis at discretion of attending MD.    *  Adjust Vitamin C to 500 mg 3 x weekly as it Royse be cause of loose stools.  Since she received the Feraheme, I down-dosed po iron to BID.    *  Solid HH diet.      Azucena Freed  10/29/2017, 11:57 AM Phone [phone]

## 2017-10-29 NOTE — Evaluation (Signed)
Physical Therapy Evaluation Patient Details Name: [name] MRN: [id number] DOB: December 18, 1929 Today's Date: 10/29/2017   History of Present Illness  84 yo female  with onset of SOB for several days with brown phlegm was admitted for CHF and respiratory failure, severe LE edema and PNA.  Has thrombocytopenia, limb jerking from EGD anesthesia on 4/16, anemia, and pulm HTN.  PMHx:  colon CA with ostomies, CAD, mitral v regurg, a-fib, DVT, CHF,   Clinical Impression  Pt is up to walk with assistance and noted her O2 sats on regular cannula as follows: SATURATION QUALIFICATIONS: (This note is used to comply with regulatory documentation for home oxygen)  Patient Saturations on Room Air at Rest = 100%  Patient Saturations on Room Air while Ambulating = 85%  Patient Saturations on 2 Liters of oxygen while Ambulating = 93%  Please briefly explain why patient needs home oxygen:  O2 sats are not sustaining activity while off supplemental O2    Follow Up Recommendations SNF    Equipment Recommendations  Rolling walker with 5" wheels(if hers is not in good repair)    Recommendations for Other Services       Precautions / Restrictions Precautions Precautions: Fall(telemetry) Precaution Comments: light headed in sitting Restrictions Weight Bearing Restrictions: No      Mobility  Bed Mobility Overal bed mobility: Needs Assistance Bed Mobility: Supine to Sit;Sit to Supine     Supine to sit: Min assist;Mod assist Sit to supine: Mod assist   General bed mobility comments: assisted to lift trunk OOB and legs back to bed  Transfers Overall transfer level: Needs assistance Equipment used: Rolling walker (2 wheeled) Transfers: Sit to/from Stand Sit to Stand: Min assist;Mod assist         General transfer comment: min higher surfaces and mod in low space of BR  Ambulation/Gait Ambulation/Gait assistance: Min assist;+2 physical assistance;+2 safety/equipment Ambulation Distance  (Feet): 70 Feet Assistive device: Rolling walker (2 wheeled);2 person hand held assist(one for safety and use of pulse ox ) Gait Pattern/deviations: Step-to pattern;Step-through pattern;Decreased stride length;Wide base of support;Trunk flexed;Drifts right/left Gait velocity: reduced Gait velocity interpretation: <1.8 ft/sec, indicate of risk for recurrent falls General Gait Details: jerking at rare times on longer trip on hallway but initially in room was quite unsteady adn needed min to mod assist to control balance  Stairs            Wheelchair Mobility    Modified Rankin (Stroke Patients Only)       Balance Overall balance assessment: Needs assistance Sitting-balance support: Feet supported Sitting balance-Leahy Scale: Fair   Postural control: Posterior lean Standing balance support: Bilateral upper extremity supported;During functional activity Standing balance-Leahy Scale: Poor                               Pertinent Vitals/Pain Pain Assessment: No/denies pain    Home Living Family/patient expects to be discharged to:: Skilled nursing facility Living Arrangements: Alone Available Help at Discharge: Family;Available PRN/intermittently Type of Home: House Home Access: Level entry     Home Layout: One level Home Equipment: Walker - 2 wheels;Walker - 4 wheels;Cane - single point;Shower seat;Bedside commode      Prior Function Level of Independence: Independent with assistive device(s)         Comments: using rollator for mobility     Hand Dominance   Dominant Hand: Right    Extremity/Trunk Assessment   Upper Extremity  Assessment Upper Extremity Assessment: Overall WFL for tasks assessed    Lower Extremity Assessment Lower Extremity Assessment: Generalized weakness    Cervical / Trunk Assessment Cervical / Trunk Assessment: Kyphotic  Communication   Communication: No difficulties  Cognition Arousal/Alertness: Awake/alert Behavior  During Therapy: WFL for tasks assessed/performed Overall Cognitive Status: Within Functional Limits for tasks assessed                                        General Comments General comments (skin integrity, edema, etc.): fragile skin with LE edema and bruising     Exercises     Assessment/Plan    PT Assessment Patient needs continued PT services  PT Problem List Decreased strength;Decreased range of motion;Decreased activity tolerance;Decreased balance;Decreased mobility;Decreased coordination;Decreased knowledge of use of DME;Decreased safety awareness;Cardiopulmonary status limiting activity;Obesity;Decreased skin integrity       PT Treatment Interventions DME instruction;Gait training;Functional mobility training;Therapeutic activities;Therapeutic exercise;Balance training;Neuromuscular re-education;Patient/family education    PT Goals (Current goals can be found in the Care Plan section)  Acute Rehab PT Goals Patient Stated Goal: to get up and walk PT Goal Formulation: With patient Time For Goal Achievement: 11/12/17 Potential to Achieve Goals: Good    Frequency Min 3X/week   Barriers to discharge Decreased caregiver support home alone with unsteady gait    Co-evaluation               AM-PAC PT "6 Clicks" Daily Activity  Outcome Measure Difficulty turning over in bed (including adjusting bedclothes, sheets and blankets)?: A Lot Difficulty moving from lying on back to sitting on the side of the bed? : Unable Difficulty sitting down on and standing up from a chair with arms (e.g., wheelchair, bedside commode, etc,.)?: Unable Help needed moving to and from a bed to chair (including a wheelchair)?: A Lot Help needed walking in hospital room?: A Lot Help needed climbing 3-5 steps with a railing? : Total 6 Click Score: 9    End of Session Equipment Utilized During Treatment: Gait belt;Oxygen Activity Tolerance: Patient limited by fatigue;Treatment  limited secondary to medical complications (Comment) Patient left: in bed;with call bell/phone within reach;with bed alarm set;with family/visitor present Nurse Communication: Mobility status PT Visit Diagnosis: Unsteadiness on feet (R26.81);Muscle weakness (generalized) (M62.81);Difficulty in walking, not elsewhere classified (R26.2);Adult, failure to thrive (R62.7)    Time: 3220-2542 PT Time Calculation (min) (ACUTE ONLY): 38 min   Charges:   PT Evaluation $PT Eval Moderate Complexity: 1 Mod PT Treatments $Gait Training: 8-22 mins $Therapeutic Activity: 8-22 mins   PT G Codes:   PT G-Codes **NOT FOR INPATIENT CLASS** Functional Assessment Tool Used: AM-PAC 6 Clicks Basic Mobility   Ramond Dial 10/29/2017, 5:33 PM   Mee Hives, PT MS Acute Rehab Dept. Number: [place] and [place]

## 2017-10-30 DIAGNOSIS — Z7901 Long term (current) use of anticoagulants: Secondary | ICD-10-CM

## 2017-10-30 DIAGNOSIS — F329 Major depressive disorder, single episode, unspecified: Secondary | ICD-10-CM

## 2017-10-30 DIAGNOSIS — I4891 Unspecified atrial fibrillation: Secondary | ICD-10-CM

## 2017-10-30 DIAGNOSIS — F419 Anxiety disorder, unspecified: Secondary | ICD-10-CM

## 2017-10-30 LAB — BASIC METABOLIC PANEL
Anion gap: 8 (ref 5–15)
BUN: 20 mg/dL (ref 6–20)
CO2: 33 mmol/L — ABNORMAL HIGH (ref 22–32)
Calcium: 8.4 mg/dL — ABNORMAL LOW (ref 8.9–10.3)
Chloride: 98 mmol/L — ABNORMAL LOW (ref 101–111)
Creatinine, Ser: 1.22 mg/dL — ABNORMAL HIGH (ref 0.44–1.00)
GFR calc Af Amer: 45 mL/min — ABNORMAL LOW (ref >60)
GFR calc non Af Amer: 39 mL/min — ABNORMAL LOW (ref >60)
Glucose, Bld: 87 mg/dL (ref 65–99)
Potassium: 3.5 mmol/L (ref 3.5–5.1)
Sodium: 139 mmol/L (ref 135–145)

## 2017-10-30 LAB — CBC
HCT: 34.7 % — ABNORMAL LOW (ref 36.0–46.0)
Hemoglobin: 10.2 g/dL — ABNORMAL LOW (ref 12.0–15.0)
MCH: 25.7 pg — ABNORMAL LOW (ref 26.0–34.0)
MCHC: 29.4 g/dL — ABNORMAL LOW (ref 30.0–36.0)
MCV: 87.4 fL (ref 78.0–100.0)
Platelets: 147 10*3/uL — ABNORMAL LOW (ref 150–400)
RBC: 3.97 MIL/uL (ref 3.87–5.11)
RDW: 17.7 % — ABNORMAL HIGH (ref 11.5–15.5)
WBC: 4.9 10*3/uL (ref 4.0–10.5)

## 2017-10-30 LAB — MAGNESIUM: Magnesium: 2.1 mg/dL (ref 1.7–2.4)

## 2017-10-30 MED ORDER — IPRATROPIUM BROMIDE 0.02 % IN SOLN: 0.5000 mg | Freq: Four times a day (QID) | RESPIRATORY_TRACT | Status: DC | PRN

## 2017-10-30 MED ORDER — LEVALBUTEROL HCL 0.63 MG/3ML IN NEBU: 0.6300 mg | INHALATION_SOLUTION | Freq: Four times a day (QID) | RESPIRATORY_TRACT | Status: DC | PRN

## 2017-10-30 NOTE — Care Management Note (Signed)
Case Management Note  Patient Details  Name: [name] MRN: [id number] Date of Birth: 28-Oct-1929  Subjective/Objective:                Spoke w patient and daughter in the room. They would like to go home at DC. Discussed [facility] providers and copays, they would like to use [facility], referral accepted by Dorian Pod, clinical liaison.     Action/Plan:  Needs HH orders. Watch for nebulizer and home O2 needs per Dr Cruzita Lederer. Will need orders and delivery to room prior to DC.    Expected Discharge Date:                  Expected Discharge Plan:  [place]  In-House Referral:     Discharge planning Services  CM Consult  Post Acute Care Choice:  Home Health Choice offered to:  Patient, Adult Children  DME Arranged:    DME Agency:     HH Arranged:    [place] Agency:  [facility]  Status of Service:  In process, will continue to follow  If discussed at Long Length of Stay Meetings, dates discussed:    Additional Comments:  Carles Collet, RN 10/30/2017, 11:04 AM

## 2017-10-30 NOTE — Clinical Social Work Note (Signed)
Clinical Social Work Assessment  Patient Details  Name: [name] MRN: [id number] Date of Birth: 11-Jul-1930  Date of referral:  10/30/17               Reason for consult:  Facility Placement, Discharge Planning                Permission sought to share information with:  Facility Sport and exercise psychologist, Family Supports Permission granted to share information::  Yes, Verbal Permission Granted  Name::     [name]  Agency::     Relationship::  Daughter  Contact Information:  [phone]  Housing/Transportation Living arrangements for the past 2 months:  Single Family Home Source of Information:  Patient, Medical Team, Adult Children Patient Interpreter Needed:  None Criminal Activity/Legal Involvement Pertinent to Current Situation/Hospitalization:  No - Comment as needed Significant Relationships:  Adult Children, Other Family Members Lives with:  Self Do you feel safe going back to the place where you live?  Yes Need for family participation in patient care:  Yes (Comment)  Care giving concerns:  PT recommending SNF placement once medically stable for discharge.   Social Worker assessment / plan:  CSW met with patient. Daughter at bedside. CSW introduced role and explained that PT recommendations would be discussed. Patient prefers to go home rather than SNF. She does not want HHPT but is agreeable to a home health RN to check BP, labs, etc. Patient's daughter put in her resignation at her job yesterday and plans to assist patient at home. Patient's daughter asking about getting compensation for staying home with her mother. RNCM came into room at this point and notified them that this was only possible if she had Medicaid. No further concerns. CSW signing off as social work intervention is no longer needed.  Employment status:  Retired Forensic scientist:  Other (Comment Required)([employer]) PT Recommendations:  [place] / Referral  to community resources:  [place]  Patient/Family's Response to care:  Patient and her daughter prefers home health. Patient's family supportive and involved in patient's care. Patient and her daughter appreciated social work intervention.  Patient/Family's Understanding of and Emotional Response to Diagnosis, Current Treatment, and Prognosis:  Patient and her daughter have a good understanding of the reason for admission but prefer to return home rather than SNF. Patient and her daughter appear happy with hospital care.  Emotional Assessment Appearance:  Appears stated age Attitude/Demeanor/Rapport:  Engaged Affect (typically observed):  Appropriate, Calm, Pleasant Orientation:  Oriented to Self, Oriented to Place, Oriented to  Time, Oriented to Situation Alcohol / Substance use:  Never Used Psych involvement (Current and /or in the community):  No (Comment)  Discharge Needs  Concerns to be addressed:  Care Coordination Readmission within the last 30 days:  No Current discharge risk:  Dependent with Mobility, Lives alone Barriers to Discharge:  Continued Medical Work up   Candie Chroman, LCSW 10/30/2017, 10:06 AM

## 2017-10-30 NOTE — Progress Notes (Signed)
PROGRESS NOTE  [name] HCW:[id number] DOB: August 13, 1929 DOA: 10/26/2017 PCP: Marletta Lor, MD   LOS: 4 days   Brief Narrative / Interim history: Pt. with PMH of CAD, diastolic CHF, hypertension, chronic atrial fibrillation on anticoagulation with Eliquis, GERD, hyperlipidemia; admitted on 10/26/2017, presented with complaint of fatigue, was found to have acute on chronic diastolic CHF and intractable nausea and vomiting  Assessment & Plan: Principal Problem:   Acute on chronic diastolic CHF (congestive heart failure) ([place]) Active Problems:   Essential hypertension   GERD   Lethargy   Anxiety and depression   Cancer of ascending colon (HCC)   Chronic atrial fibrillation (HCC)   Coronary artery disease of native artery of native heart with stable angina pectoris (HCC)   Nausea and vomiting   Iron deficiency anemia   Chronic anticoagulation   CAP (community acquired pneumonia)   Thrombocytopenia (HCC)   Abnormal stress test   Moderate mitral regurgitation   Upper GI bleed   Gastric ulcer   Atrial fibrillation (HCC)   Acute on chronic diastolic CHF causing acute hypoxic respiratory failure -Patient presented with shortness of breath, she was diuresed with IV Lasix, improving.  She is on room air at rest however still requires oxygen with ambulation, hopefully she will be able to be okay on room air by tomorrow -Continue IV Lasix today, discussed with cardiology, potentially home discharge on Friday -CT chest negative for PE  Coronary artery disease/stable angina pectoris/abnormal stress test -Cardiology following, recent outpatient nuclear stress test was intermediate risk with mild ischemia in the mid anterior and apical area. -For now patient wants to defer cardiac catheterization given potential need for dual antiplatelet therapy and recent GI bleed  Chronic atrial fibrillation (HCC) -Continue metoprolol and Cardizem. - Hold Eliquis in the setting of possible GI  bleed.  Based on GI recommendation it appears that patient can be started on this medication in few days. -Resume potentially in 2-3 days following discharge, I have asked home health to obtain a CBC next week and fax results to her PCP  ?  Hematemesis, iron deficiency anemia -Initial concerns for coffee ground emesis, no further episodes in the hospital however Hemoccult was positive.  GI was consulted and patient underwent an EGD which showed a superficial ulcer with a possible site of bleeding without any active bleeding at present -Continue PPI, GI signed off, status post IV iron infusion x1 -Resume Eliquis in a few days with close outpatient monitoring of the CBC  Cancer of ascending colon ([place]) -Patient has significant abdominal pain on left lower quadrant as well as ongoing nausea as well as multivessel episode of vomiting at home. CT scan of the abdomen performed, tolerated well.  No evidence of acute obstruction or other acute abnormality.  CAP (community acquired pneumonia) -Presented with shortness of breath, get a CT chest which was reported as a possible atypical pneumonia. -Continue ceftriaxone and azithromycin for a total of 5 days  Thrombocytopenia (HCC) -platelets stable  Anxiety and depression -Continue Zoloft, trazodone, Ativan, gabapentin   DVT prophylaxis: SCDs Code Status: Full code Family Communication: d/w daughter at bedside Disposition Plan: home with [facility] in 1 day  Consultants:   Cardiology   Procedures:   2D echo:  Impressions: - The right ventricular systolic pressure was increased consistent with moderate pulmonary hypertension.  Antimicrobials:  Ceftriaxone 4/14 >>  Azithromycin 4/14 >>  Subjective: -Feeling slightly better however still continues to feel very weak.  She does not want to  go to SNF.  No chest pain, no shortness of breath at rest  Objective: Vitals:   10/29/17 1953 10/29/17 2106 10/30/17 0622 10/30/17 0842  BP: (!) 97/54   122/70 127/88  Pulse: 75 76 70 70  Resp: 18 18 18    Temp: 97.9 F (36.6 C)  97.9 F (36.6 C)   TempSrc: Oral  Oral   SpO2: 95% 96% 99%   Weight:   84.5 kg (186 lb 3.2 oz)   Height:        Intake/Output Summary (Last 24 hours) at 10/30/2017 1251 Last data filed at 10/30/2017 0923 Gross per 24 hour  Intake 960 ml  Output 900 ml  Net 60 ml   Filed Weights   10/28/17 0429 10/29/17 0430 10/30/17 0622  Weight: 85.9 kg (189 lb 4.8 oz) 85.1 kg (187 lb 9.8 oz) 84.5 kg (186 lb 3.2 oz)    Examination:  Constitutional: NAD Eyes: lids and conjunctivae normal Neck: normal, supple, no masses, no thyromegaly Respiratory: clear to auscultation bilaterally, no wheezing, no crackles. Normal respiratory effort  Cardiovascular: Regular rate and rhythm, no murmurs / rubs / gallops. No LE edema. 2+ pedal pulses.  Abdomen: no tenderness. Bowel sounds positive.  Skin: no rashes Neurologic: non focal.  Psychiatric: Normal judgment and insight. Alert and oriented x 3. Normal mood.    Data Reviewed: I have independently reviewed following labs and imaging studies   CBC: Recent Labs  Lab 10/27/17 0610 10/27/17 1136 10/28/17 0900 10/29/17 0001 10/29/17 0801 10/30/17 0402  WBC 7.1 10.8* 10.6*  --  6.3 4.9  NEUTROABS  --   --   --   --  4.7  --   HGB 8.3* 8.8* 8.2* 10.3* 9.8* 10.2*  HCT 28.1* 29.9* 28.8* 35.8* 33.2* 34.7*  MCV 84.9 85.2 86.2  --  87.1 87.4  PLT 131* 143* 142*  --  136* 829*   Basic Metabolic Panel: Recent Labs  Lab 10/26/17 1230 10/27/17 0610 10/28/17 0900 10/29/17 0801 10/30/17 0402  NA 141 140 139 140 139  K 4.3 3.6 4.5 4.0 3.5  CL 105 103 104 102 98*  CO2 25 26 27 30  33*  GLUCOSE 105* 174* 97 98 87  BUN 38* 31* 33* 27* 20  CREATININE 1.34* 1.26* 1.28* 1.15* 1.22*  CALCIUM 9.2 8.3* 8.6* 8.5* 8.4*  MG  --  1.9  --   --  2.1   GFR: Estimated Creatinine Clearance: 35.4 mL/min (A) (by C-G formula based on SCr of 1.22 mg/dL (H)). Liver Function Tests: Recent  Labs  Lab 10/27/17 0610 10/28/17 0900  AST 31 34  ALT 19 22  ALKPHOS 42 40  BILITOT 0.7 0.6  PROT 6.2* 6.1*  ALBUMIN 3.1* 3.1*   No results for input(s): LIPASE, AMYLASE in the last 168 hours. No results for input(s): AMMONIA in the last 168 hours. Coagulation Profile: Recent Labs  Lab 10/29/17 1147  INR 1.28   Cardiac Enzymes: Recent Labs  Lab 10/27/17 0048 10/27/17 0610 10/27/17 1136  TROPONINI <0.03 <0.03 <0.03   BNP (last 3 results) Recent Labs    08/04/17 1222  PROBNP 366.0*   HbA1C: No results for input(s): HGBA1C in the last 72 hours. CBG: No results for input(s): GLUCAP in the last 168 hours. Lipid Profile: No results for input(s): CHOL, HDL, LDLCALC, TRIG, CHOLHDL, LDLDIRECT in the last 72 hours. Thyroid Function Tests: No results for input(s): TSH, T4TOTAL, FREET4, T3FREE, THYROIDAB in the last 72 hours. Anemia Panel: No results for  input(s): VITAMINB12, FOLATE, FERRITIN, TIBC, IRON, RETICCTPCT in the last 72 hours. Urine analysis:    Component Value Date/Time   COLORURINE AMBER (A) 10/26/2016 1642   APPEARANCEUR HAZY (A) 10/26/2016 1642   LABSPEC 1.020 10/26/2016 1642   PHURINE 7.0 10/26/2016 1642   GLUCOSEU NEGATIVE 10/26/2016 1642   HGBUR NEGATIVE 10/26/2016 1642   HGBUR trace-lysed 11/08/2009 1028   BILIRUBINUR NEGATIVE 10/26/2016 1642   BILIRUBINUR n 08/25/2014 1132   KETONESUR NEGATIVE 10/26/2016 1642   PROTEINUR NEGATIVE 10/26/2016 1642   UROBILINOGEN 0.2 08/25/2014 1132   UROBILINOGEN 0.2 05/31/2014 1534   NITRITE NEGATIVE 10/26/2016 1642   LEUKOCYTESUR TRACE (A) 10/26/2016 1642   Sepsis Labs: Invalid input(s): PROCALCITONIN, LACTICIDVEN  Recent Results (from the past 240 hour(s))  Culture, blood (Routine X 2) w Reflex to ID Panel     Status: None (Preliminary result)   Collection Time: 10/26/17 10:25 PM  Result Value Ref Range Status   Specimen Description BLOOD LEFT HAND  Final   Special Requests   Final    BOTTLES DRAWN  AEROBIC AND ANAEROBIC Blood Culture adequate volume   Culture   Final    NO GROWTH 3 DAYS Performed at [facility], [address] [place], [place] [address]    Report Status PENDING  Incomplete  Culture, blood (Routine X 2) w Reflex to ID Panel     Status: None (Preliminary result)   Collection Time: 10/26/17 10:40 PM  Result Value Ref Range Status   Specimen Description BLOOD RIGHT HAND  Final   Special Requests   Final    BOTTLES DRAWN AEROBIC AND ANAEROBIC Blood Culture adequate volume   Culture   Final    NO GROWTH 3 DAYS Performed at [facility], [place] [address], [place], [place] [address]    Report Status PENDING  Incomplete      Radiology Studies: No results found.   Scheduled Meds: . azithromycin  500 mg Oral Daily  . diltiazem  30 mg Oral Q8H  . docusate sodium  100 mg Oral BID  . ferrous sulfate  325 mg Oral BID WC  . furosemide  20 mg Intravenous BID  . gabapentin  600 mg Oral BID  . isosorbide mononitrate  15 mg Oral Daily  . metoprolol tartrate  100 mg Oral BID  . pantoprazole  40 mg Oral BID  . polyethylene glycol  17 g Oral Daily  . sertraline  50 mg Oral Daily  . traZODone  50 mg Oral QHS  . [START ON 10/31/2017] vitamin C  500 mg Oral Once per day on Mon Wed Fri   Continuous Infusions:   Marzetta Board, MD, PhD [facility] Pager [phone]  If 7PM-7AM, please contact night-coverage [email or web address] Password [facility] 10/30/2017, 12:51 PM

## 2017-10-30 NOTE — Progress Notes (Addendum)
Progress Note  Patient Name: [name] Date of Encounter: 10/30/2017  Primary Cardiologist: Dr. Grayland Jack  Subjective   Pt doing well this AM. She is up to chair. States that she is not interested in further ischemic workup and would prefer to go home with assistance of her daughter (who just resigned from her job to care for her mother).   Inpatient Medications    Scheduled Meds: . azithromycin  500 mg Oral Daily  . diltiazem  30 mg Oral Q8H  . docusate sodium  100 mg Oral BID  . ferrous sulfate  325 mg Oral BID WC  . furosemide  20 mg Intravenous BID  . gabapentin  600 mg Oral BID  . isosorbide mononitrate  15 mg Oral Daily  . metoprolol tartrate  100 mg Oral BID  . pantoprazole  40 mg Oral BID  . polyethylene glycol  17 g Oral Daily  . sertraline  50 mg Oral Daily  . traZODone  50 mg Oral QHS  . [START ON 10/31/2017] vitamin C  500 mg Oral Once per day on Mon Wed Fri   Continuous Infusions:  PRN Meds: guaiFENesin-dextromethorphan, HYDROcodone-acetaminophen, ipratropium, levalbuterol, LORazepam, ondansetron **OR** ondansetron (ZOFRAN) IV, traMADol   Vital Signs    Vitals:   10/29/17 1536 10/29/17 1953 10/29/17 2106 10/30/17 0622  BP:  (!) 97/54  122/70  Pulse: 78 75 76 70  Resp: 20 18 18 18   Temp:  97.9 F (36.6 C)  97.9 F (36.6 C)  TempSrc:  Oral  Oral  SpO2: 91% 95% 96% 99%  Weight:    186 lb 3.2 oz (84.5 kg)  Height:        Intake/Output Summary (Last 24 hours) at 10/30/2017 0811 Last data filed at 10/30/2017 9563 Gross per 24 hour  Intake 1487 ml  Output 1500 ml  Net -13 ml   Filed Weights   10/28/17 0429 10/29/17 0430 10/30/17 0622  Weight: 189 lb 4.8 oz (85.9 kg) 187 lb 9.8 oz (85.1 kg) 186 lb 3.2 oz (84.5 kg)    Physical Exam   General: Elderly, NAD Skin: Warm, dry, intact  Head: Normocephalic, atraumatic,  clear, moist mucus membranes. Neck: Negative for carotid bruits. No JVD Lungs:Clear to ausculation bilaterally. No wheezes,  rales, or rhonchi. Breathing is unlabored. Cardiovascular: Irregularly irregular with S1 S2. No murmurs, rubs, gallops, or LV heave appreciated. Abdomen: Soft, non-tender, non-distended with normoactive bowel sounds.  No obvious abdominal masses. MSK: Strength and tone appear normal for age. 5/5 in all extremities Extremities: No edema. No clubbing or cyanosis. DP/PT pulses 2+ bilaterally Neuro: Alert and oriented. No focal deficits. No facial asymmetry. MAE spontaneously. Psych: Responds to questions appropriately with normal affect.    Labs    Chemistry Recent Labs  Lab 10/27/17 0610 10/28/17 0900 10/29/17 0801 10/30/17 0402  NA 140 139 140 139  K 3.6 4.5 4.0 3.5  CL 103 104 102 98*  CO2 26 27 30  33*  GLUCOSE 174* 97 98 87  BUN 31* 33* 27* 20  CREATININE 1.26* 1.28* 1.15* 1.22*  CALCIUM 8.3* 8.6* 8.5* 8.4*  PROT 6.2* 6.1*  --   --   ALBUMIN 3.1* 3.1*  --   --   AST 31 34  --   --   ALT 19 22  --   --   ALKPHOS 42 40  --   --   BILITOT 0.7 0.6  --   --   GFRNONAA 38*  37* 42* 39*  GFRAA 44* 43* 49* 45*  ANIONGAP 11 8 8 8      Hematology Recent Labs  Lab 10/28/17 0900 10/29/17 0001 10/29/17 0801 10/30/17 0402  WBC 10.6*  --  6.3 4.9  RBC 3.34*  --  3.81* 3.97  HGB 8.2* 10.3* 9.8* 10.2*  HCT 28.8* 35.8* 33.2* 34.7*  MCV 86.2  --  87.1 87.4  MCH 24.6*  --  25.7* 25.7*  MCHC 28.5*  --  29.5* 29.4*  RDW 18.3*  --  18.3* 17.7*  PLT 142*  --  136* 147*    Cardiac Enzymes Recent Labs  Lab 10/27/17 0048 10/27/17 0610 10/27/17 1136  TROPONINI <0.03 <0.03 <0.03    Recent Labs  Lab 10/26/17 1323  TROPIPOC 0.02     BNP Recent Labs  Lab 10/26/17 1230  BNP 478.6*     DDimer No results for input(s): DDIMER in the last 168 hours.   Radiology    No results found.  Telemetry    10/30/17 Atrial fibrillation HR 82 - Personally Reviewed  ECG    No new tracings as of 10/30/17 - Personally Reviewed  Cardiac Studies   Echocardiogram 10/28/17: Study  Conclusions  - Left ventricle: The cavity size was normal. There was severe focal basal and moderate concentric hypertrophy. Systolic function was normal. The estimated ejection fraction was in the range of 55% to 60%. Wall motion was normal; there were no regional wall motion abnormalities. The study was not technically sufficient to allow evaluation of LV diastolic dysfunction due to atrial fibrillation. - Aortic valve: Mildly to moderately calcified annulus. Trileaflet; normal thickness, mildly calcified leaflets. - Mitral valve: There was moderate regurgitation. - Left atrium: The atrium was severely dilated. - Tricuspid valve: There was moderate regurgitation. - Pulmonic valve: There was trivial regurgitation. - Pulmonary arteries: PA peak pressure: 50 mm Hg (S).  Impressions:  - The right ventricular systolic pressure was increased consistent with moderate pulmonary hypertension.   2D Echo 08/15/17 Study Conclusions  - Left ventricle: The cavity size was normal. There was mild focal basal hypertrophy of the septum. Systolic function was normal. The estimated ejection fraction was in the range of 55% to 60%. Wall motion was normal; there were no regional wall motion abnormalities. The study is not technically sufficient to allow evaluation of LV diastolic function. - Aortic valve: Trileaflet; mildly thickened, mildly calcified leaflets. - Mitral valve: There was moderate regurgitation. - Left atrium: The atrium was severely dilated. - Right atrium: The atrium was severely dilated. - Tricuspid valve: There was moderate regurgitation. - Pulmonary arteries: Systolic pressure was moderately increased. PA peak pressure: 54 mm Hg (S).  NST 08/15/17 Study Highlights    Nuclear stress EF: 52%.  Small region of moderate ischemia in the distal anterior, anterolateral, septal and apical walls. Cannot exclude some shifting breast  attenuation.  There was no ST segment deviation noted during stress.  This is an intermediate risk study.   Patient Profile     84 y.o. female with a hx of CAD s/p BMS to RCA x 2 in 9937, diastolic CHF,mitral regurgitation,HTN, HLD, persistent afib on Eliquis, chronic dyspnea on exertion, COPD, pulmonary HTN, prior TIA, prior DVT and h/ocolon CAwho was admitted overnight for CAP and acute on chronic diastolic HF, also found to be anemic and + FOBT. She is being seen today forthe evaluation ofchest pain,at the request of Dr. Posey Pronto, Internal Medicine.  Assessment & Plan    1. Chest Pain: -Continues  to have mild chest "heaviness" this AM however, states that it has improved since yesterday   -IMDUR, low dose 15mg  added 10/29/17 -Trop,<0.03, <0.03, <0.03 -EKGwithout acute ST-T wave changes, atrial fibrillation -Could consider cath once anemia stabilized>>GI consulted for endoscopy for possible upper GI tract bleeding, 10/28/17 -Can resume Eliquis per GI a few days post procedure if no obvious bleeding or drop in HB -Likely able to resume tomorrow? -Long discussion with pt today. She is not interested in pursuing further ischemic workup. She would like to continue with medical management and would like to go home under the care assistance of her daughter.   2. Anemia: -GI consulted 10/27/17>>>endoscopy completed 10/28/17 with superficial small gastric ulcers with no high risk stigmata of bleeding>>>H. Pylori samples taken with pending results -Hgb 10.2 today  -FOBT is positiveon admission  -Eliquis is now on holdfor endoscopy>>can resume in a few days per GI OP report if no significant drop in HB or obvious bleeding  3. CAD: -h/oBMS to RCA x 2 in 2009. Recent NST 08/2017 showed a small area of moderate ischemia in the distal anterior and anterolateral, septal and apical walls. [name] could not exclude some shifting breast attenuation. The study was felt to be intermediate  risk, however medical therapy was elected as pt only with exertional symptoms with moderate to heavy activity. She has had recent CP with slight ST depressions in the inferior leads, but this [place] be demand ischemia 2/2 worsening anemia. -Given her anemia and FOBT is positive -Recommend continuation of medical therapy -Continue BBwith the additionstatin -No ASA until further GI w/u -Eliquis for atrial fibrillation is on hold>>resume several days post procedure>>currently awating clearance   4. CAP: -On antibiotics for atypical PNA -Blood cultures pending   5. Chronic Atrial Fibrillation: -Rate is controlled with metoprolol100and Cardizem 30 -Eliquis is on hold for worsening anemia, w/drop in Hgb from 11>8.8.FOBT +>>>more stable today at 10.2 -Eliquis ok to resume several days post procedure if no acute bleeding occurs>>endoscopy procedure on 10/28/17>>likely able to resume tomorrow?  -CHA2DS2 VASc scoreis at least 7 for CHF, HTN, Age > 63, h/o TIA and female sex.This patients CHA2DS2-VASc Score and unadjusted Ischemic Stroke Rate (% per year) is equal to11.2 % stroke rate/year from a score of 7  6. Acute on Chronic Diastolic HF: -PerEcho [id number] normal LVEF>>>echo 10/28/17 with moderate pulmonary HTN -BNP this admit abnormal at 478>treated with IV lasix 20mg  BID -Strict I/Os and daily weights, low sodium diet  -Renal function is stable>>no BMET as of 0109 this AM -Wt, 186lb, down 1lb from 10/29/17 -I&O, net negative 129ml, total 24H output 1.5L  7. Mitral Regurgitation: -Moderate by recent echo. Not severe enough to be considered for MV clip at that time.  8. H/o LE DVT: -Pt reports DVT occurred post TKR several years ago -Eliquis currently on hold given suspected GIB -Continue with SCDs.   Signed, Kathyrn Drown NP-C [facility] Pager: [phone] 10/30/2017, 8:11 AM     The patient was seen, examined and discussed with Kathyrn Drown, NP  and  I agree with the above.   Post EGD on 4/16, small non-bleeding gastric ulcer, she was started on protonix BID, and Imdur 15 mg po daily, started to walk with improved symptoms. Given that she needs long term anticoagulation a cath and possible DAPT with Eliquis would be a very high risk for bleeding. She wishes for a conservative management. We will await GI input about timing for Eliquis use. Hb stable at 9.8. Anticipated discharge  tomorrow with home PT.  Ena Dawley, MD 10/30/2017

## 2017-10-30 NOTE — Progress Notes (Signed)
Physical Therapy Treatment Patient Details Name: [name] MRN: [id number] DOB: 1930/02/25 Today's Date: 10/30/2017    History of Present Illness Pt is an 84 y/o female with onset of SOB for several days with brown phlegm was admitted for CHF and respiratory failure, severe LE edema and PNA.  Has thrombocytopenia, limb jerking from EGD anesthesia on 4/16, anemia, and pulm HTN.  PMHx:  colon CA with ostomies, CAD, mitral v regurg, a-fib, DVT, CHF,     PT Comments    Pt making steady progress with functional mobility, requiring less physical assistance this session. Pt would continue to benefit from skilled physical therapy services at this time while admitted and after d/c to address the below listed limitations in order to improve overall safety and independence with functional mobility.   SATURATION QUALIFICATIONS: (This note is used to comply with regulatory documentation for home oxygen)  Patient Saturations on Room Air at Rest = 96%  Patient Saturations on Room Air while Ambulating = 92%  Patient Saturations on 2 Liters of oxygen while Ambulating = n/a  Please briefly explain why patient needs home oxygen:  Pt did not desat on RA during ambulation this session.     Follow Up Recommendations  SNF     Equipment Recommendations  Rolling walker with 5" wheels    Recommendations for Other Services       Precautions / Restrictions Precautions Precautions: Fall Restrictions Weight Bearing Restrictions: No    Mobility  Bed Mobility Overal bed mobility: Needs Assistance Bed Mobility: Supine to Sit     Supine to sit: Min guard     General bed mobility comments: increased time and effort, use of bed rails, pt able to scoot forwards to EOB with min guard as well  Transfers Overall transfer level: Needs assistance Equipment used: Rolling walker (2 wheeled) Transfers: Sit to/from Stand Sit to Stand: Min guard         General transfer comment: pt able to  transition into standing from EOB with bed at standard height, min guard for safety and RW for stability  Ambulation/Gait Ambulation/Gait assistance: Min guard Ambulation Distance (Feet): 40 Feet Assistive device: Rolling walker (2 wheeled) Gait Pattern/deviations: Step-through pattern;Decreased stride length;Trunk flexed Gait velocity: decreased Gait velocity interpretation: <1.8 ft/sec, indicate of risk for recurrent falls General Gait Details: pt with mild instability with ambulation using RW but no overt LOB or need for physical assistance, min guard for safety; pt ambulated on RA with SPO2 maintaining >90% throughout. Pt with no reports of SOB or chest pain   Stairs             Wheelchair Mobility    Modified Rankin (Stroke Patients Only)       Balance Overall balance assessment: Needs assistance Sitting-balance support: Feet supported Sitting balance-Leahy Scale: Fair     Standing balance support: Bilateral upper extremity supported;During functional activity Standing balance-Leahy Scale: Poor                              Cognition Arousal/Alertness: Awake/alert Behavior During Therapy: WFL for tasks assessed/performed Overall Cognitive Status: Within Functional Limits for tasks assessed                                        Exercises      General Comments  Pertinent Vitals/Pain Pain Assessment: No/denies pain    Home Living                      Prior Function            PT Goals (current goals can now be found in the care plan section) Acute Rehab PT Goals PT Goal Formulation: With patient Time For Goal Achievement: 11/12/17 Potential to Achieve Goals: Good Progress towards PT goals: Progressing toward goals    Frequency    Min 3X/week      PT Plan Current plan remains appropriate;Other (comment)(pt left on RA as SPO2 maintained >90% throughout)    Co-evaluation              AM-PAC  PT "6 Clicks" Daily Activity  Outcome Measure  Difficulty turning over in bed (including adjusting bedclothes, sheets and blankets)?: A Little Difficulty moving from lying on back to sitting on the side of the bed? : A Little Difficulty sitting down on and standing up from a chair with arms (e.g., wheelchair, bedside commode, etc,.)?: Unable Help needed moving to and from a bed to chair (including a wheelchair)?: A Little Help needed walking in hospital room?: A Little Help needed climbing 3-5 steps with a railing? : A Lot 6 Click Score: 15    End of Session   Activity Tolerance: Patient limited by fatigue Patient left: in chair;with call bell/phone within reach;with family/visitor present Nurse Communication: Mobility status PT Visit Diagnosis: Other abnormalities of gait and mobility (R26.89)     Time: 8889-1694 PT Time Calculation (min) (ACUTE ONLY): 30 min  Charges:  $Gait Training: 8-22 mins $Therapeutic Activity: 8-22 mins                    G Codes:       [place], [place], [place] 10/30/2017, 10:50 AM

## 2017-10-31 DIAGNOSIS — I482 Chronic atrial fibrillation: Secondary | ICD-10-CM

## 2017-10-31 DIAGNOSIS — D696 Thrombocytopenia, unspecified: Secondary | ICD-10-CM

## 2017-10-31 DIAGNOSIS — K219 Gastro-esophageal reflux disease without esophagitis: Secondary | ICD-10-CM

## 2017-10-31 DIAGNOSIS — C182 Malignant neoplasm of ascending colon: Secondary | ICD-10-CM

## 2017-10-31 LAB — CBC
HCT: 37.4 % (ref 36.0–46.0)
Hemoglobin: 11.1 g/dL — ABNORMAL LOW (ref 12.0–15.0)
MCH: 25.1 pg — ABNORMAL LOW (ref 26.0–34.0)
MCHC: 29.7 g/dL — ABNORMAL LOW (ref 30.0–36.0)
MCV: 84.6 fL (ref 78.0–100.0)
Platelets: 163 10*3/uL (ref 150–400)
RBC: 4.42 MIL/uL (ref 3.87–5.11)
RDW: 17 % — ABNORMAL HIGH (ref 11.5–15.5)
WBC: 6.8 10*3/uL (ref 4.0–10.5)

## 2017-10-31 LAB — BASIC METABOLIC PANEL
Anion gap: 12 (ref 5–15)
BUN: 16 mg/dL (ref 6–20)
CO2: 29 mmol/L (ref 22–32)
Calcium: 9 mg/dL (ref 8.9–10.3)
Chloride: 97 mmol/L — ABNORMAL LOW (ref 101–111)
Creatinine, Ser: 1.11 mg/dL — ABNORMAL HIGH (ref 0.44–1.00)
GFR calc Af Amer: 51 mL/min — ABNORMAL LOW (ref >60)
GFR calc non Af Amer: 44 mL/min — ABNORMAL LOW (ref >60)
Glucose, Bld: 102 mg/dL — ABNORMAL HIGH (ref 65–99)
Potassium: 3.5 mmol/L (ref 3.5–5.1)
Sodium: 138 mmol/L (ref 135–145)

## 2017-10-31 LAB — CULTURE, BLOOD (ROUTINE X 2)
Culture: NO GROWTH
Culture: NO GROWTH
Special Requests: ADEQUATE
Special Requests: ADEQUATE

## 2017-10-31 MED ORDER — AZITHROMYCIN 250 MG PO TABS: 250.0000 mg | ORAL_TABLET | Freq: Every day | ORAL | 0 refills | Status: AC

## 2017-10-31 MED ORDER — ISOSORBIDE MONONITRATE ER 30 MG PO TB24: 15.0000 mg | ORAL_TABLET | Freq: Every day | ORAL | 0 refills | Status: DC

## 2017-10-31 MED ORDER — ESOMEPRAZOLE MAGNESIUM 40 MG PO CPDR: 40.0000 mg | DELAYED_RELEASE_CAPSULE | Freq: Every day | ORAL | 0 refills | Status: DC

## 2017-10-31 NOTE — Consult Note (Signed)
   [facility] CM Inpatient Consult   10/31/2017  [name] 08/26/1929 [id number]   Follow up:  Patient signed consent earlier this week for [facility] Care Management services.  Chart review reveals patient is declining skilled nursing for rehab as recommended by inpatient therapies.  Met with patient and daughter [name] regarding [place] [facility] follow up and needs. Patient is refusing SNF and refusing HHPT. Only accepting HHRN.  She will be going to her daughter, [name] house [address]  [place] [address].    Patient will be assigned a nurse for community follow up.  Her primary care provider is listed to provide the transition of care call and follow up. Patient will be followed for Pneumonia and HF for disease and care management.    For questions, please contact:  Natividad Brood, RN BSN [facility] Liaison  [phone] business mobile phone Toll free office [phone]

## 2017-10-31 NOTE — Care Management Important Message (Signed)
Important Message  Patient Details  Name: [name] MRN: [id number] Date of Birth: 18-Dec-1929   Medicare Important Message Given:  Yes    [name] [name] 10/31/2017, 10:39 AM

## 2017-10-31 NOTE — Progress Notes (Signed)
Pt got discharged to home, discharge instructions provided and patient showed understanding to it, IV taken out,Telemonitor DC,pt left unit in wheelchair with all of the belongings accompanied with a family member (Daughter)  Vester Balthazor, RN 

## 2017-10-31 NOTE — Progress Notes (Signed)
Physical Therapy Treatment Patient Details Name: [name] MRN: [id number] DOB: 01-18-1930 Today's Date: 10/31/2017    History of Present Illness Pt is an 84 y/o female with onset of SOB for several days with brown phlegm was admitted for CHF and respiratory failure, severe LE edema and PNA.  Has thrombocytopenia, limb jerking from EGD anesthesia on 4/16, anemia, and pulm HTN.  PMHx:  colon CA with ostomies, CAD, mitral v regurg, a-fib, DVT, CHF,     PT Comments    Patient is progressing very well towards their physical therapy goals. Requiring up to min assist for transfers from lower surface but increased ambulation distance to 200 feet with RW and min guard. Patient gait speed of 1.18 ft/s does indicate she is a limited household ambulator currently and at risk for falls. Continue to recommend SNF based on this to maximize patient's functional mobility and increase independence.     Follow Up Recommendations  SNF     Equipment Recommendations  Rolling walker with 5" wheels    Recommendations for Other Services       Precautions / Restrictions Precautions Precautions: Fall Restrictions Weight Bearing Restrictions: No    Mobility  Bed Mobility Overal bed mobility: Needs Assistance Bed Mobility: Supine to Sit     Supine to sit: Supervision     General bed mobility comments: increased time and effort, use of bed rails. Supervision for safety  Transfers Overall transfer level: Needs assistance Equipment used: Rolling walker (2 wheeled);None Transfers: Sit to/from Stand Sit to Stand: Min guard;Min assist         General transfer comment: pt able to transition into standing from EOB with increased time to achieve hip extension; min guard provided. Patient requiring min assist for transfer from lowered surface of toilet.   Ambulation/Gait Ambulation/Gait assistance: Min guard Ambulation Distance (Feet): 200 Feet Assistive device: Rolling walker (2 wheeled) Gait  Pattern/deviations: Step-through pattern;Decreased stride length;Trunk flexed Gait velocity: 1.18 ft/s Gait velocity interpretation: <1.31 ft/sec, indicative of household ambulator General Gait Details: Min guard for safety. patient with no complaint of shortness of breath. Ambulates with trunk flexed position; unable to correct with VC/TC's.    Stairs             Wheelchair Mobility    Modified Rankin (Stroke Patients Only)       Balance Overall balance assessment: Needs assistance Sitting-balance support: Feet supported Sitting balance-Leahy Scale: Fair     Standing balance support: Bilateral upper extremity supported;During functional activity Standing balance-Leahy Scale: Poor Standing balance comment: reliant on BUE support for dynamic balance                            Cognition Arousal/Alertness: Awake/alert Behavior During Therapy: WFL for tasks assessed/performed Overall Cognitive Status: Within Functional Limits for tasks assessed                                        Exercises      General Comments        Pertinent Vitals/Pain Pain Assessment: No/denies pain    Home Living                      Prior Function            PT Goals (current goals can now be found in the care  plan section) Acute Rehab PT Goals Patient Stated Goal: to get up and walk PT Goal Formulation: With patient Time For Goal Achievement: 11/12/17 Potential to Achieve Goals: Good Progress towards PT goals: Progressing toward goals    Frequency    Min 3X/week      PT Plan Current plan remains appropriate    Co-evaluation              AM-PAC PT "6 Clicks" Daily Activity  Outcome Measure  Difficulty turning over in bed (including adjusting bedclothes, sheets and blankets)?: A Little Difficulty moving from lying on back to sitting on the side of the bed? : A Little Difficulty sitting down on and standing up from a chair  with arms (e.g., wheelchair, bedside commode, etc,.)?: Unable Help needed moving to and from a bed to chair (including a wheelchair)?: A Little Help needed walking in hospital room?: A Little Help needed climbing 3-5 steps with a railing? : A Lot 6 Click Score: 15    End of Session Equipment Utilized During Treatment: Gait belt Activity Tolerance: Patient tolerated treatment well Patient left: with call bell/phone within reach;in bed   PT Visit Diagnosis: Other abnormalities of gait and mobility (R26.89)     Time: 9373-4287 PT Time Calculation (min) (ACUTE ONLY): 10 min  Charges:  $Gait Training: 8-22 mins                    G Codes:      Ellamae Sia, PT, DPT Acute Rehabilitation Services  Pager: [place] 10/31/2017, 3:34 PM

## 2017-10-31 NOTE — Care Management Note (Signed)
Case Management Note  Patient Details  Name: [name] MRN: [id number] Date of Birth: 07-13-1930  Subjective/Objective:                    Action/Plan: Pt discharging home with [facility] services. CM informed Dorian Pod with [facility] of patients discharge. CM called patients daughter and updated her on the d/c. She will be at the hospital around 5 pm to pick up her mother. Bedside RN made aware.  Daughter states her mother is having a hard time affording Eliquis. Pt not discharging on Eliquis but to f/u with her PCP about timing to restart the medication. CM encouraged the daughter to have the PCP office ask insurance for a tier exception for Eliquis. Daughter voiced understanding.   Expected Discharge Date:  10/31/17               Expected Discharge Plan:  [place]  In-House Referral:     Discharge planning Services  CM Consult  Post Acute Care Choice:  Home Health Choice offered to:  Patient, Adult Children  DME Arranged:    DME Agency:     HH Arranged:  RN, PT, Nurse's Aide Bee Agency:  [facility]  Status of Service:  Completed, signed off  If discussed at [place] of Stay Meetings, dates discussed:    Additional Comments:  Pollie Friar, RN 10/31/2017, 3:33 PM

## 2017-10-31 NOTE — Progress Notes (Addendum)
At 1346 pt had 11 beats of v-tach, but pt is stable, asymptomatic, MD aware, vitals stable, pt is being discharged today  Palma Holter, Therapist, sports

## 2017-10-31 NOTE — Discharge Summary (Signed)
Physician Discharge Summary  [name] DOB: 12/12/1929 DOA: 10/26/2017  PCP: Marletta Lor, MD  Admit date: 10/26/2017 Discharge date: 10/31/2017  Admitted From: Home Disposition: Home   Recommendations for Outpatient Follow-up:  1. Follow up with PCP in 1-2 weeks.  2. Follow up with home health RN, to draw CBC in the next week, send results to PCP who can inform decision to restart eliquis.  3. Please obtain CBC in one week 4. Follow up EGD biopsy results and follow up with GI. Continuing PPI.  5. Cardiology to arrange hospital follow up appointment.  Home Health: RN, PT, aide Equipment/Devices: Does not require oxygen.  Discharge Condition: Stable CODE STATUS: Full Diet recommendation: Heart healthy  Brief/Interim Summary: [name] is an 85 y.o. female with PMH of CAD, diastolic CHF, hypertension, chronic atrial fibrillation on anticoagulation with Eliquis, GERD, hyperlipidemia; admitted on4/14/2019, presented with complaint of fatigue, was found to have acute on chronic diastolic CHF and intractable nausea and vomiting. Due to concern for hematemesis prior to admission and + hemoccult, GI was consulted, performing EGD which showed superficial ulcer without stigmata of active bleeding. Recommendation was to hold eliquis until follow up, so plan is to have PCP follow up in next 1-2 weeks and have home health RN draw CBC, send results to PCP to inform decision regarding restarting eliquis. Cardiology was consulted for atrial fibrillation and made some medication changes. The patient initially required oxygen, but with diuresis she has ambulated without hypoxia. PT evaluators recommended short term rehabilitation, though the patient declined this, opting instead to go home with her daughter who has resigned from her job to care for the patient.   Discharge Diagnoses:  Principal Problem:   Acute on chronic diastolic CHF (congestive heart failure) (HCC) Active  Problems:   Essential hypertension   GERD   Lethargy   Anxiety and depression   Cancer of ascending colon (HCC)   Chronic atrial fibrillation (HCC)   Coronary artery disease of native artery of native heart with stable angina pectoris (HCC)   Nausea and vomiting   Iron deficiency anemia   Chronic anticoagulation   CAP (community acquired pneumonia)   Thrombocytopenia (HCC)   Abnormal stress test   Moderate mitral regurgitation   Upper GI bleed   Gastric ulcer   Atrial fibrillation (HCC)  Acute on chronic diastolic CHF causing acute hypoxic respiratory failure: Patient presented with shortness of breath, she was diuresed with IV Lasix, improving, no longer hypoxic with exertion. CT chest negative for PE - Restart po lasix per cardiology recommendations.  - Follow up with cardiology (to be arranged by cardiology). Weight at discharge 181lbs (down from 189lbs at admission).  - Note: benazepril is being held due to soft blood pressures when starting imdur. Consider restarting at low dose pending BP at follow up.   Coronary artery disease/stable angina pectoris/abnormal stress test: Recent outpatient nuclear stress test was intermediate risk with mild ischemia in the mid anterior and apical area. -For now patient wants to defer cardiac catheterization given potential need for dual antiplatelet therapy and recent GI bleed - Start imdur for antianginal effect. Due to soft blood pressures, benazepril was held and will continue to be held at discharge (discussed with cardiology)  Chronic atrial fibrillation ([place]) - Continue metoprolol and cardizem. - Hold Eliquis in the setting of possible GI bleed.Based on GI recommendation it appears that patient can be started on this medication in few days. We have asked home health to  obtain a CBC next week and fax results to her PCP  Gastric ulcers, iron deficiency anemia: Initial concerns for coffee ground emesis, no further episodes in the hospital  however Hemoccult was positive.  GI was consulted and patient underwent an EGD which showed a superficial ulcer with a possible site of bleeding without any active bleeding at present - Continue PPI 40mg  daily, po iron. Received IV iron x1.  - Resume Eliquis in a few days with close outpatient monitoring of the CBC  Cancer of ascending colon ([place]) - Patient has significant abdominal pain on left lower quadrant as well as ongoing nausea as well as multivessel episode of vomiting at home. CT scan of the abdomen performed, tolerated well. No evidence of acute obstruction or other acute abnormality.  CAP (community acquired pneumonia): Presented with shortness of breath, get a CT chest which was reported as a possible atypical pneumonia. - Complete 5 days of azithromycin  Thrombocytopenia  - Platelets stable  Anxiety and depression - Continue zoloft, trazodone, ativan, gabapentin  Discharge Instructions Discharge Instructions    AMB Referral to [facility]   Complete by:  As directed    Please assign to community nurse for HF and COPD/Pneumonia program and assess for home visits.  Patient is refusing SNF and refusing HHPT. Only accepting HHRN.  She will be going to her daughter, [name] house [address],  [place] [address].  Please only call [phone].  Please see if Dimple Nanas at [facility], [place], [place] @ [phone] will be appropriate for personal care services, daughter wants to see how she does over the weekend and will let assigned nurse know if this is a need.   Questions please call:   Natividad Brood, RN BSN [facility] Liaison  [phone] business mobile phone Toll free office [phone]   Reason for consult:  Plan has changed   Diagnoses of:   Heart Failure COPD/ Pneumonia     Expected date of contact:  1-3 days (reserved for hospital discharges)   Diet - low sodium heart healthy   Complete by:  As directed    Discharge  instructions   Complete by:  As directed    - Continue taking lasix, metoprolol, diltiazem, and other medications as below with the following changes:  - START taking imdur once daily. Because this can also lower blood pressure, STOP taking benazepril - DO NOT TAKE ELIQUIS until directed by your PCP. The home health RN will check your blood counts and your PCP will have to weigh in on when to restart eliquis.  - INCREASE nexium dose to 40mg  daily (from 20mg  daily) a new prescription was sent to your pharmacy.  - Complete a course of azithromycin for a probable pneumonia. You will need to take 1 tablet daily starting tomorrow morning for just the next 2 days. This was also sent to your pharmacy.  - Follow up with cardiology (you will be contacted to schedule a hospital follow up appointment).  - You will receive home health services which have been arranged prior to discharge.  - Return for care immediately if you experience new bleeding or chest pain or shortness of breath.   Increase activity slowly   Complete by:  As directed      Allergies as of 10/31/2017   No Known Allergies     Medication List    STOP taking these medications   apixaban 5 MG Tabs tablet Commonly known as:  ELIQUIS  benazepril 40 MG tablet Commonly known as:  LOTENSIN     TAKE these medications   acetaminophen 325 MG tablet Commonly known as:  TYLENOL Take 650 mg by mouth every 6 (six) hours as needed for moderate pain.   azithromycin 250 MG tablet Commonly known as:  ZITHROMAX Take 1 tablet (250 mg total) by mouth daily for 2 doses. Take 1,000mg  (2 tablets) by mouth once.   diltiazem 30 MG tablet Commonly known as:  CARDIZEM Take 1 tablet (30 mg total) by mouth every 8 (eight) hours.   esomeprazole 40 MG capsule Commonly known as:  NEXIUM Take 1 capsule (40 mg total) by mouth daily. What changed:    medication strength  how much to take  when to take this   ferrous sulfate 325 (65 FE) MG EC  tablet Take 325 mg by mouth daily with breakfast.   furosemide 20 MG tablet Commonly known as:  LASIX Take 1 tablet (20 mg total) by mouth daily.   gabapentin 600 MG tablet Commonly known as:  NEURONTIN Take 600 mg by mouth 2 (two) times daily.   HYDROcodone-acetaminophen 5-325 MG tablet Commonly known as:  NORCO/VICODIN Take 1-2 tablets by mouth every 6 (six) hours as needed for moderate pain.   isosorbide mononitrate 30 MG 24 hr tablet Commonly known as:  IMDUR Take 0.5 tablets (15 mg total) by mouth daily. Start taking on:  11/01/2017   LORazepam 0.5 MG tablet Commonly known as:  ATIVAN TAKE 1 TABLET BY MOUTH TWICE DAILY AS NEEDED FOR ANXIETY OR  SLEEP   metoprolol tartrate 100 MG tablet Commonly known as:  LOPRESSOR Take 1 tablet (100 mg total) by mouth 2 (two) times daily.   nitroGLYCERIN 0.4 MG SL tablet Commonly known as:  NITROSTAT Place 1 tablet (0.4 mg total) under the tongue every 5 (five) minutes as needed for chest pain (x 3 pills). Reported on 09/01/2015   promethazine 12.5 MG tablet Commonly known as:  PHENERGAN TAKE 1 TABLET (12.5 MG TOTAL) BY MOUTH EVERY 8 HOURS AS NEEDED FOR NAUSEA OR VOMITING. What changed:    how much to take  how to take this  when to take this  reasons to take this  additional instructions   sertraline 50 MG tablet Commonly known as:  ZOLOFT Take 1 tablet (50 mg total) by mouth daily.   traZODone 100 MG tablet Commonly known as:  DESYREL Take 0.5 tablets (50 mg total) by mouth at bedtime.   triamcinolone cream 0.1 % Commonly known as:  KENALOG Apply 1 application topically 2 (two) times daily as needed (Eczema on legs).   vitamin C 1000 MG tablet Take 1,000 mg by mouth daily.      Follow-up Information    Marletta Lor, MD. Schedule an appointment as soon as possible for a visit in 1 week(s).   Specialty:  Internal Medicine Contact information: [place]  [address] [phone]        [name], Wonda Cheng, MD .   Specialty:  Cardiology Contact information: [address] [place] [address] [phone]          No Known Allergies  Consultations:  Cardiology  GI  Procedures/Studies: Dg Chest 2 View  Result Date: 10/26/2017 CLINICAL DATA:  Chest pain and increased SOB x 4 days. Hx COPD, PNA, heart stents, stroke. EXAM: CHEST - 2 VIEW COMPARISON:  Chest x-ray dated 07/31/2017. FINDINGS: Stable cardiomegaly. Lungs are clear. No pleural effusion or  pneumothorax seen. Stable elevation/eventration of the RIGHT hemidiaphragm. No acute or suspicious osseous finding. IMPRESSION: No active cardiopulmonary disease. No evidence of pneumonia or pulmonary edema. Stable cardiomegaly. Electronically Signed   By: Franki Cabot M.D.   On: 10/26/2017 13:19   Ct Angio Chest Pe W/cm &/or Wo Cm  Result Date: 10/26/2017 CLINICAL DATA:  Short of breath bilateral lower limb swelling cough history of DVT EXAM: CT ANGIOGRAPHY CHEST WITH CONTRAST TECHNIQUE: Multidetector CT imaging of the chest was performed using the standard protocol during bolus administration of intravenous contrast. Multiplanar CT image reconstructions and MIPs were obtained to evaluate the vascular anatomy. CONTRAST:  112mL ISOVUE-370 IOPAMIDOL (ISOVUE-370) INJECTION 76% COMPARISON:  Chest x-ray 10/26/2017, CT chest 07/22/2017, 09/12/2016 FINDINGS: Cardiovascular: Satisfactory opacification of the pulmonary arteries to the segmental level. No evidence of pulmonary embolism. Moderate aortic atherosclerosis. No aneurysmal dilatation. Coronary vascular calcification. Mild cardiomegaly. No pericardial effusion. Mediastinum/Nodes: Midline trachea. No thyroid mass. Stable 11 mm low right paratracheal lymph nodes. Esophagus within normal limits Lungs/Pleura: Bilateral mosaic pattern, more prominent compared to prior with right greater than left ground-glass densities and nodularity,  increased compared to the prior chest CT. No pleural effusion or pneumothorax. Upper Abdomen: Reflux of contrast into the hepatic veins. Surgical clips in the right upper quadrant. Musculoskeletal: Degenerative changes. No acute or suspicious abnormality. Review of the MIP images confirms the above findings. IMPRESSION: 1. Negative for acute pulmonary embolus. 2. Right greater than left ground-glass density with increased nodularity in the right upper, middle and lower lobes, suspicious for pulmonary infection, possible atypical pneumonia. Short interval CT follow-up recommended following antibiotic trial and ensure clearing. Persistent mediastinal adenopathy, possibly reactive and not significantly changed since January 2019. Aortic Atherosclerosis (ICD10-I70.0). Electronically Signed   By: Donavan Foil M.D.   On: 10/26/2017 21:03   Ct Abdomen Pelvis W Contrast  Result Date: 10/27/2017 CLINICAL DATA:  Subacute onset of generalized weakness, nausea and vomiting. EXAM: CT ABDOMEN AND PELVIS WITH CONTRAST TECHNIQUE: Multidetector CT imaging of the abdomen and pelvis was performed using the standard protocol following bolus administration of intravenous contrast. CONTRAST:  159mL ISOVUE-300 IOPAMIDOL (ISOVUE-300) INJECTION 61% COMPARISON:  CT of the abdomen and pelvis from 07/22/2017 FINDINGS: Lower chest: Mild hazy right basilar airspace opacity [place] reflect mild asymmetric interstitial edema or possibly pneumonia. A moderate hiatal hernia is noted. Scattered coronary artery calcifications are seen. Hepatobiliary: The liver is unremarkable in appearance. The patient is status post cholecystectomy, with clips noted at the gallbladder fossa. The common bile duct remains normal in caliber. Pancreas: The pancreas is within normal limits. Spleen: The spleen is unremarkable in appearance. Adrenals/Urinary Tract: The adrenal glands are unremarkable in appearance. Nonspecific perinephric stranding is noted bilaterally. A  small left renal cyst is noted. There is no evidence of hydronephrosis. No renal or ureteral stones are identified. Stomach/Bowel: The patient is status post resection of the cecum. The ileocolic anastomosis is unremarkable in appearance. Bowel suture lines are noted at the sigmoid colon. The small bowel is grossly unremarkable in appearance. The stomach is otherwise unremarkable in appearance. Vascular/Lymphatic: Scattered calcification is seen along the abdominal aorta and its branches. The abdominal aorta is otherwise grossly unremarkable. The inferior vena cava is grossly unremarkable. No retroperitoneal lymphadenopathy is seen. No pelvic sidewall lymphadenopathy is identified. Reproductive: The bladder is mildly distended and grossly unremarkable. The patient is status post hysterectomy. No suspicious adnexal masses are seen. Other: No additional soft tissue abnormalities are seen. Musculoskeletal: No acute osseous abnormalities are identified.  Multilevel vacuum phenomenon is noted along the lumbar spine. The visualized musculature is unremarkable in appearance. IMPRESSION: 1. Mild hazy right basilar airspace opacity [place] reflect mild asymmetric interstitial edema or possibly pneumonia. 2. Moderate hiatal hernia. 3. Scattered coronary artery calcifications. 4. Small left renal cyst. Aortic Atherosclerosis (ICD10-I70.0). Electronically Signed   By: Garald Balding M.D.   On: 10/27/2017 23:23   2D echo:  Impressions: - The right ventricular systolic pressure was increased consistentwith moderate pulmonary hypertension.  EGD 10/28/2017: Impression:               - Esophagogastric landmarks identified.                           - 5 cm hiatal hernia.                           - Non-bleeding superficial small gastric ulcers                            with no high risk stigmata of bleeding.                           - Normal duodenal bulb and second portion of the                            duodenum.                            - Biopsies were taken with a cold forceps for                            Helicobacter pylori testing.                           I suspect ulcerations are the likely cause for the                            patient's worsening anemia in the setting of                            Eliquis use Recommendation:           - Return patient to hospital ward for ongoing care.                           - Resume previous diet.                           - Continue present medications.                           - Can switch protonix to 40mg  PO twice daily and                            would stay on this dose for at least one month  before decreasing                           - Await pathology results, rule out H Pylori                           - Avoid NSAIDs                           - Resume Eliquis in the next few days if otherwise                            stable                           - Call with questions  Subjective: Walked the full length of the hallway with stable dyspnea (about at baseline from prior to admission) and no chest pain. No bleeding. Has significant support set up at home. Her daughter has resigned from her job to care for her full time.   Discharge Exam: Vitals:   10/31/17 0655 10/31/17 1211  BP: 137/76 (!) 102/57  Pulse:  71  Resp:  18  Temp:  97.9 F (36.6 C)  SpO2:  94%   General: Pt is alert, awake, not in acute distress Cardiovascular: Irreg irreg, U7/M5 +, + soft systolic murmur, no rubs, no gallops Respiratory: CTA bilaterally, no wheezing, no rhonchi Abdominal: Soft, NT, ND, bowel sounds + Extremities: Trace pedal edema, no cyanosis  Labs: BNP (last 3 results) Recent Labs    10/26/17 1230  BNP 465.0*   Basic Metabolic Panel: Recent Labs  Lab 10/27/17 0610 10/28/17 0900 10/29/17 0801 10/30/17 0402 10/31/17 0405  NA 140 139 140 139 138  K 3.6 4.5 4.0 3.5 3.5  CL 103 104 102 98* 97*  CO2 26 27 30  33*  29  GLUCOSE 174* 97 98 87 102*  BUN 31* 33* 27* 20 16  CREATININE 1.26* 1.28* 1.15* 1.22* 1.11*  CALCIUM 8.3* 8.6* 8.5* 8.4* 9.0  MG 1.9  --   --  2.1  --    Liver Function Tests: Recent Labs  Lab 10/27/17 0610 10/28/17 0900  AST 31 34  ALT 19 22  ALKPHOS 42 40  BILITOT 0.7 0.6  PROT 6.2* 6.1*  ALBUMIN 3.1* 3.1*   No results for input(s): LIPASE, AMYLASE in the last 168 hours. No results for input(s): AMMONIA in the last 168 hours. CBC: Recent Labs  Lab 10/27/17 1136 10/28/17 0900 10/29/17 0001 10/29/17 0801 10/30/17 0402 10/31/17 0405  WBC 10.8* 10.6*  --  6.3 4.9 6.8  NEUTROABS  --   --   --  4.7  --   --   HGB 8.8* 8.2* 10.3* 9.8* 10.2* 11.1*  HCT 29.9* 28.8* 35.8* 33.2* 34.7* 37.4  MCV 85.2 86.2  --  87.1 87.4 84.6  PLT 143* 142*  --  136* 147* 163   Cardiac Enzymes: Recent Labs  Lab 10/27/17 0048 10/27/17 0610 10/27/17 1136  TROPONINI <0.03 <0.03 <0.03   BNP: Invalid input(s): POCBNP CBG: No results for input(s): GLUCAP in the last 168 hours. D-Dimer No results for input(s): DDIMER in the last 72 hours. Hgb A1c No results for input(s): HGBA1C in the last 72 hours. Lipid Profile No results for input(s): CHOL, HDL, LDLCALC, TRIG, CHOLHDL, LDLDIRECT  in the last 72 hours. Thyroid function studies No results for input(s): TSH, T4TOTAL, T3FREE, THYROIDAB in the last 72 hours.  Invalid input(s): FREET3 Anemia work up No results for input(s): VITAMINB12, FOLATE, FERRITIN, TIBC, IRON, RETICCTPCT in the last 72 hours. Urinalysis    Component Value Date/Time   COLORURINE AMBER (A) 10/26/2016 1642   APPEARANCEUR HAZY (A) 10/26/2016 1642   LABSPEC 1.020 10/26/2016 1642   PHURINE 7.0 10/26/2016 1642   GLUCOSEU NEGATIVE 10/26/2016 1642   HGBUR NEGATIVE 10/26/2016 1642   HGBUR trace-lysed 11/08/2009 1028   BILIRUBINUR NEGATIVE 10/26/2016 1642   BILIRUBINUR n 08/25/2014 1132   KETONESUR NEGATIVE 10/26/2016 1642   PROTEINUR NEGATIVE 10/26/2016 1642    UROBILINOGEN 0.2 08/25/2014 1132   UROBILINOGEN 0.2 05/31/2014 1534   NITRITE NEGATIVE 10/26/2016 1642   LEUKOCYTESUR TRACE (A) 10/26/2016 1642    Microbiology Recent Results (from the past 240 hour(s))  Culture, blood (Routine X 2) w Reflex to ID Panel     Status: None   Collection Time: 10/26/17 10:25 PM  Result Value Ref Range Status   Specimen Description BLOOD LEFT HAND  Final   Special Requests   Final    BOTTLES DRAWN AEROBIC AND ANAEROBIC Blood Culture adequate volume   Culture   Final    NO GROWTH 5 DAYS Performed at [facility], [place] [address] [place], [place] [address]    Report Status 10/31/2017 FINAL  Final  Culture, blood (Routine X 2) w Reflex to ID Panel     Status: None   Collection Time: 10/26/17 10:40 PM  Result Value Ref Range Status   Specimen Description BLOOD RIGHT HAND  Final   Special Requests   Final    BOTTLES DRAWN AEROBIC AND ANAEROBIC Blood Culture adequate volume   Culture   Final    NO GROWTH 5 DAYS Performed at [facility], [place] [address] [place], [place] [address]    Report Status 10/31/2017 FINAL  Final    Time coordinating discharge: Approximately 40 minutes  Vance Gather, MD  [facility] 10/31/2017, 3:21 PM Pager [phone]

## 2017-10-31 NOTE — Plan of Care (Signed)
  Problem: Health Behavior/Discharge Planning: Goal: Ability to manage health-related needs will improve Outcome: Adequate for Discharge   Problem: Clinical Measurements: Goal: Will remain free from infection Outcome: Adequate for Discharge   Problem: Clinical Measurements: Goal: Respiratory complications will improve Outcome: Adequate for Discharge

## 2017-11-03 ENCOUNTER — Encounter: Payer: Self-pay | Admitting: *Deleted

## 2017-11-03 ENCOUNTER — Telehealth: Payer: Self-pay | Admitting: Family Medicine

## 2017-11-03 ENCOUNTER — Other Ambulatory Visit: Payer: Self-pay | Admitting: *Deleted

## 2017-11-03 DIAGNOSIS — I482 Chronic atrial fibrillation: Secondary | ICD-10-CM | POA: Diagnosis not present

## 2017-11-03 DIAGNOSIS — F329 Major depressive disorder, single episode, unspecified: Secondary | ICD-10-CM | POA: Diagnosis not present

## 2017-11-03 DIAGNOSIS — I110 Hypertensive heart disease with heart failure: Secondary | ICD-10-CM | POA: Diagnosis not present

## 2017-11-03 NOTE — Patient Outreach (Signed)
[facility]) [place] Telephone Outreach PCP practice completes [facility] post-hospital discharge  11/03/2017  Rmani Kapusta Gloster 07-Mar-1930 [id number]  Successful telephone outreach to [name], caregiver/ daughter, on [facility] CM written consent, of [name], 84 y/o female referred to [place] after recent hospitalization April 14-19, 2019 for fatigue, N/V, Acute on chronic CHF, and possible CAP.  Patient was discharged home with home health services for nursing only, as she refused SNF placement for short-term rehabilitation and home health PT services.  Patient has history including, but not limited to, CAD; dCHF; A-Fib, on ACT as baseline-- currently on hold due to possible GI bleed; GERD; and HTN/ HLD.  HIPAA/ identity verified with patient's caregiver today.  [place] services were discussed with caregiver; patient and caregiver had requested that [place] outreach be directed to caregiver, as caregiver verbalized plans to stop working and have patient live with her full time.  Today, caregiver reports that patient stayed with her over weekend, and that she (caregiver) is currently continuing to work, with probable plans to retire soon, "depending on how well patient" progresses after last hospitalization.  Caregiver reports that she is currently at work, and is unable to talk for very long.  Reports that patient's son (caregiver brother) is currently on his way from out of town to stay with patient at patient's home for a few days until family "can figure everything out."  Caregiver reports patient "had a good weekend;" and denies that patient is in distress, but states that patient "always has pain all over;" denies changes or increased pain today.    Caregiver further reports:  Medications: -- Has all medicationsand takes as prescribed;denies questions about current medications, and reports that she is unable to complete review of patient's  medications today, as she is at work.  -- reports patient self-manage medications using weekly pill planner box. -- denies patient issues with swallowing medications -- confirms that she and patient "went through all" of patient's medications after hospital discharge to ensure that patient was taking medications as ordered post-hospital discharge; confirms that patient is taking "new antibiotic and has stopped blood thinner." -- encouraged caregiver to take all of patient's current medications to upcoming scheduled PCP appointment for review, and she agrees to do so  Home health [facility]) services: -- [facility] services for RN in place through [facility] reports that she has phone number for [facility], and that RN visited patient today; caregiver reported that [facility] visit was scheduled for 9:00 am, but that nurse did not arrive "until after 11:00;" stating that she (caregiver) had to leave patient at home and "go to work." -- encouraged patient's active participation in [facility] services  Provider appointments: -- All upcoming provider appointments were reviewed with caregiver today -- PCP appointment Monday 11/10/17 -- Cardiology appointment Thursday 11/12/17  Social/ Community Resource needs: -- currently denies community resource needs, stating supportive family members that assist with care needs as indicated, including "sitting with patient" when daughter is at work; admits that patient "[place] need more" depending on progress post-hospital discharge -- caregiver provides transportation for patient to all provider appointments, errands, etc and verbalizes plans to attend upcoming scheduled provider appointments "with patient."  Advanced Directive (AD) Planning:   --reports does currently have exisisting AD in place for HCPOA and living will, and denies that patient would want to make any changes  Self-health management of chronic disease state of CHF: -- reports patient "usually" monitors  and records  daily weights, but did not do over weekend, due to "business of holiday weekend."  Reports patient weighed herslef this morning prior to caregiver leaving for work, and reported to caregiver that her "weight was up;"  Caregiver denies knowing what patient's weight was at time of hospital discharge, and this information was provided to caregiver:  181 lbs at time of hospital discharge. -- Caregiver reports weight reported by patient this morning as "183 lbs." -- discussed rationale for monitoring and recording daily weights and weight gain guidelines to contact provider for weight gain > 3 lbs overnight/ 5 lbs in one week; caregiver states she is "vaguely aware" of guidelines and will reinforce with patient -- reports that patient "is always very short of breath" with any activity, and recovers "quickly" with resumption of rest; denies that patient has had trouble/ issues breathing outside of her normal post-hospital discharge -- denies patient's ongoing use of O2 at home; states patient "Hawe have nebulizer coming," reporting that this was ordered at time of hospital discharge, but that patient nor caregiver had "heard back" from home health DME agency yet; caregiver stated "she [place] not need this," and states she plans to discuss with PCP during upcoming office visit next week. -- reports patient "always has" nausea and "sometimes" vomiting due to "hiatal hernia."  Daughter states that patient is also frequently dizzy, but is unsure cause of dizziness, stating that it "Deak be an inner ear problem," or "low BP."  Encouraged caregiver to discuss with providers during upcoming provider office visits, and she agrees to do so.  Patient's caregiver denies further issues, concerns, or problems today.  I provided/ confirmed that patient has my direct phone number, the main [facility] CM office phone number, and the [facility] CM 24-hour nurse advice phone number should issues arise prior to next scheduled [place]  outreach,and explained that another [facility] RN CCm would contact caregiver next week.  Encouraged patient and/ or caregiver to contact me directly if needs, questions, issues, or concerns arise for the remainder of this week; caregiver agreed to do so.  Plan:  Patient will take medications and attend all scheduled provider appointments post- hospital discharge  Patient will actively participate with home health nursing services as ordered post-hospital discharge  Patient will resume daily weight monitoring and recording, and will take these values to upcoming provider appointments  Patient/ caregiver will promptly notify care providers for any new concerns/ issues/ or problems  I will make patient's PCP aware of [facility] CM involvement in patient's care post-hospital discharge (will send barriers letter)  [place] outreach to continue with phone call next week post- scheduled PCP appointment  [place] Problem One     Most Recent Value  Care Plan Problem One  Risk for hospital readmission related to/ as evidenced by recent hospitalization April 14-19, 2019 for CHF exacerbation   Role Documenting the Problem One  Care Management Coordinator  Care Plan for Problem One  Active  [facility] Long Term Goal   Over the next 31 days, patient will not experience hospital readmission, as evidenced by patient/ caregiver reporting and review of EMR during [place] outreach  [facility] Long Term Goal Start Date  11/03/17  Interventions for Problem One Long Term Goal  Discussed with patient's caregiver patient's current clinical status/ condition,  reviewed upcoming provider appointments with caregiver and confirmed that caregiver will provide transportation to scheduled appointments,  discussed patient's current living situation,  explained and discussed [place] CM program with caregiver  [facility] CM Short Term Goal #1   Over the next 30 days, patient will actively participate in home health services  for nursing, as evidenced by patient/ caregiver reporting and collaboration with home health team, as indicated  [facility] CM Short Term Goal #1 Start Date  11/03/17  Interventions for Short Term Goal #1  Discussed with patient's caregiver patient's refusal to go to SNF for rehabilitation post-hospital discharge, as well as patient's refusal of home health PT services,  confirmed that caregiver has phone number for home health team, and that home health nurse completed visit with patient today,  encouraged patient's active participation in home health nursing program    [facility] CM Care Plan Problem Two     Most Recent Value  Care Plan Problem Two  Self-health management deficits for chronic disease state of CHF, as evidenced by recent admission for CHF exacerbation  Role Documenting the Problem Two  Care Management Coordinator  Care Plan for Problem Two  Active  [facility] CM Short Term Goal #1   Over the next 10 days, patient will attend scheduled PCP appointment and will take her daily recorded weights and her current medications with her to appointment, as evidenced by caregiver/ patient reporting and review of EMR during [facility] RN CCM outreach  [facility] CM Short Term Goal #1 Start Date  11/03/17  Interventions for Short Term Goal #2   Discussed with patient's caregiver patient's recent weights,  rationale for resuming and taking recorded daily weight log to scheduled PCP appointment,  discussed numerous medication changes post-hospital discharge with caregiver, and encouraged caregiver to take all current medications to PCP office visit for review/ clarification  [facility] CM Short Term Goal #2   Over the next 30 days, patient will monitor and record daily weights, as evidenced by patient/ caregiver reporting and review of same during [place] CCM outreach  [facility] CM Short Term Goal #2 Start Date  11/03/17  Interventions for Short Term Goal #2  Discussed with caregiver rationale for performing and recording daily weights in setting  of CHF,  discussed weight gain guidelines with caregiver and encouraged caregiver to ensure that patient resumes/ continues monitoring and recording daily weights     I appreciate the opportunity to participate in Ms. 66 care,  Oneta Rack, RN, BSN, [place] Coordinator [facility] Care Management  [phone]

## 2017-11-03 NOTE — Telephone Encounter (Signed)
Unable to reach patient at time of TCM Call. Left message for patient to return call when available.  

## 2017-11-05 ENCOUNTER — Telehealth: Payer: Self-pay | Admitting: Internal Medicine

## 2017-11-05 NOTE — Telephone Encounter (Signed)
Copied from [place] [id number]. Topic: General - Other >> Nov 05, 2017 12:09 PM Darl Householder, RMA wrote: Reason for CRM: Hinton Dyer from [facility] is calling to notify Dr. Burnice Logan of reconciliation of medication list the medications Elquis interacts with Zoloft, and the metoprolol interacts with the Zoloft, please return call to [place] at [phone]

## 2017-11-06 ENCOUNTER — Telehealth: Payer: Self-pay | Admitting: Family Medicine

## 2017-11-06 ENCOUNTER — Ambulatory Visit: Payer: Self-pay

## 2017-11-06 ENCOUNTER — Encounter: Payer: Self-pay | Admitting: *Deleted

## 2017-11-06 ENCOUNTER — Other Ambulatory Visit: Payer: Self-pay | Admitting: *Deleted

## 2017-11-06 ENCOUNTER — Other Ambulatory Visit: Payer: Self-pay

## 2017-11-06 ENCOUNTER — Telehealth: Payer: Self-pay | Admitting: Internal Medicine

## 2017-11-06 DIAGNOSIS — I25118 Atherosclerotic heart disease of native coronary artery with other forms of angina pectoris: Secondary | ICD-10-CM

## 2017-11-06 NOTE — Patient Outreach (Signed)
[place] [facility]) Care Management [place] Telephone [facility] Coordination  11/06/2017  Zaylynn Rickett [place]a Jun 12, 1930 [id number]  Successful telephone outreach for care coordination to "Taren," nurse tech for Dr. Burnice Logan, PCP of [facility] Kiely, 84 y/o female referred to [place] after recent hospitalization April 14-19, 2019 for fatigue, N/V, Acute on chronic CHF, and possible CAP.Patient was discharged home with home health services for nursing only, as she refused SNF placement for short-term rehabilitation and home health PT services. Patient has history including, but not limited to, CAD; dCHF; A-Fib, on ACT as baseline-- currently on hold due to possible GI bleed; GERD; and HTN/ HLD.   Explained to Taren that I had contacted patient/ caregiver this morning in response to patient's call to [facility] 24-hour nurse advice line last night, where patient reported low BP 84/52, with associated reported symptoms of dizziness, nausea, weakness, and faintness.  Explained that upon review of patient's medications, there were several medications that could potentially decrease patient's BP, and requested that Taren share this information with PCP, so that medications could be reviewed, and possibly patient could be evaluated by PCP as soon as possible, noting that patient has current scheduled office visit on Monday November 10, 2017.  Taren agreed to communicate this information to patient's PCP; provided my direct contact phone number for call back, if there are questions/ need for clarification.  Plan:  Will collaborate with patient's PCP staff/ team as indicated   Oneta Rack, RN, BSN, [place] Coordinator [facility] Care Management  [phone]

## 2017-11-06 NOTE — Telephone Encounter (Signed)
Spoke to [place] and she stated that the patient need CBC labs done. Patient has an appointment with Dr.Banks. Orders will be placed Dr.Banks will be notified. [place] also stated that the patient Bp today was 107/66 and weight was 188.2lb. [place] claims she gave pt a journal to chart her weight daily and notify her of any massive weight gain or weight loss.

## 2017-11-06 NOTE — Telephone Encounter (Signed)
Copied from [place]. Topic: Quick Communication - See Telephone Encounter >> Nov 06, 2017 11:19 AM Bea Graff, NT wrote: CRM for notification. See Telephone encounter for: 11/06/17. Reginia Naas, a Community Care Nurse with [facility] calling and states that this pts daughter contacted them last night because her mom was feeling very fatigue and blood pressure was very low. Richarda Osmond advised her to go to the ER. Pt refused. Daughter stayed up with her all night and the blood pressure did eventually go back up. Richarda Osmond called to check on pt this morning and blood pressure was back up but pt was complaining of dizziness. Richarda Osmond states she reviewed Ms. Valtierra's med list and a lot of the medication she is on can lower her blood pressure and was wanting to have Dr. Burnice Logan review these medications before the weekend to try and help keep this pt from going back to the hospital. She states pt was able to go have her hair done this morning, but Richarda Osmond did advise pt to rest and take it easy today. She states that home health is scheduled to come out to see this pt today and pt is scheduled for an appt on 11/10/17 with Dr. Burnice Logan. Richarda Osmond CB#: [phone]. [place] leave detailed voicemail.

## 2017-11-06 NOTE — Telephone Encounter (Signed)
Copied from [place] [id number]. Topic: Quick Communication - See Telephone Encounter >> Nov 06, 2017  1:35 PM Conception Chancy, NT wrote: CRM for notification. See Telephone encounter for: 11/06/17.  [place] is a Therapist, sports with [facility] she states she is supposed to be drawing CBC but she states they did not give her all the paper work and states since she has a appt here 11/07/17 she would like to know can the office just do that. Please advise.  [phone] Cb#

## 2017-11-06 NOTE — Telephone Encounter (Signed)
Patient's daughter, [name], called in with c/o "dizziness, low BP." She says "she just got out of the hospital and she's been dizzy for a while, but has gotten worse yesterday. Last night her BP was 84/52. We gave her plenty of fluids to drink. This morning her BP was 129/70 when she got up. We are just getting back in the house. Her BP now is 112/75, P 74. She says she feels faint, weak when standing, and she feels like she will pass out at times. She does need help walking. She vomited 2 nights ago and says she's nauseated when she's dizzy." According to protocol, see PCP within 24 hours, no availability with PCP, appointment scheduled for tomorrow at 1130 with Dr. Volanda Napoleon, care advice given, patient's daughter verbalized understanding.  Reason for Disposition . [1] MODERATE dizziness (e.g., interferes with normal activities) AND [2] has NOT been evaluated by physician for this  (Exception: dizziness caused by heat exposure, sudden standing, or poor fluid intake)  Answer Assessment - Initial Assessment Questions 1. DESCRIPTION: "Describe your dizziness."     Lightheaded 2. LIGHTHEADED: "Do you feel lightheaded?" (e.g., somewhat faint, woozy, weak upon standing)     Faint, weak upon standing 3. VERTIGO: "Do you feel like either you or the room is spinning or tilting?" (i.e. vertigo)     Room spinning 4. SEVERITY: "How bad is it?"  "Do you feel like you are going to faint?" "Can you stand and walk?"   - MILD - walking normally   - MODERATE - interferes with normal activities (e.g., work, school)    - SEVERE - unable to stand, requires support to walk, feels like passing out now.      Severe 5. ONSET:  "When did the dizziness begin?"     Got worse yesterday, been dizzy for a while 6. AGGRAVATING FACTORS: "Does anything make it worse?" (e.g., standing, change in head position)     Standing 7. HEART RATE: "Can you tell me your heart rate?" "How many beats in 15 seconds?"  (Note: not all  patients can do this)       P 74 8. CAUSE: "What do you think is causing the dizziness?"     IUnknown 9. RECURRENT SYMPTOM: "Have you had dizziness before?" If so, ask: "When was the last time?" "What happened that time?"     Yes, for a while 10. OTHER SYMPTOMS: "Do you have any other symptoms?" (e.g., fever, chest pain, vomiting, diarrhea, bleeding)       Vomited 2 nights ago, nausea 11. PREGNANCY: "Is there any chance you are pregnant?" "When was your last menstrual period?"       No  Protocols used: DIZZINESS [facility]

## 2017-11-06 NOTE — Patient Outreach (Signed)
[place] [name]) Care Management [facility] CM Telephone Outreach/ post call to [facility] 24-hour nurse advice line Post-[name] discharge day 7  11/06/2017  [name] 08-Aug-1929 [id number]  Successful telephone outreach to [name], caregiver/ daughter, on [facility] CM written consent, of [name], 84 y/o female referred to [place] after recent hospitalization April 14-19, 2019 for fatigue, N/V, Acute on chronic CHF, and possible CAP.  Patient was discharged home with home health services for nursing only, as she refused SNF placement for short-term rehabilitation and home health PT services.  Patient has history including, but not limited to, CAD; dCHF; A-Fib, on ACT as baseline-- currently on hold due to possible GI bleed; GERD; and HTN/ HLD.  HIPAA/ identity verified with patient's caregiver today.  Explained that I received notification from [name] 24-hour nurse advice line that patient's caregiver had contacted nurse last night reporting low BP, dizziness, general malaise; [name] confirmed that she contacted the nurse advice line last night, and re-iterated that patient's BP dropped last night to "84/52," and that patient "felt horrible, nauseated, dizzy, and faint."  Caregiver reported that patient refused to call EMS/ go to ED, as advised by RN on 24-hour call line; caregiver reports that she "stayed up with patient until 3:00 am," to make sure she was okay; stated that during that time, she continuously monitored patient's BP and provided gentle hydration; stated that eventually patient "started feeling better."  Reports that patient is "doing fine today" and that she just picked patient up from having her hair fixed.  Patient reports today with phone on speaker mode that she "is feeling better, but is still having periods of dizziness."  [name] reports patient's BP was "129/79" this morning prior to taking patient to have her hair fixed.  Denies that patient is in distress  today, and patient does not sound to be in distress when she speaks when phone on speaker mode.  Briefly reviewed patient's current medications with patient and caregiver, as they are unable to complete formal medication reconciliation during phone call, as caregiver is driving patient home.  Noted that patient is currently taking several medications that could potentially decrease her BP, and patient/ caregiver verify that patient is taking these medications, as prescribed.  Discussed with patient and caregiver that patient should rest for the remainder of today, attempt to stay hydrated without over-loading fluid in setting of CHF; caregiver and patient verbalize agreement and understanding.  Caregiver reports that patient has not started completing daily weight post-[name] discharge, and this was again encouraged-- post [name] discharge weight was again reviewed with caregiver, noted to be 181 lbs at time of [name] discharge; reiterated weight gain guidelines in setting of CHF, again stressing that patient should stay well hydrated given her episode of low BP and dizziness.    Caregiver states that patient has scheduled home health RN visit this afternoon; encouraged patient and caregiver to report these recent symptoms to home health nurse and to ask nurse to review medications as well.  Extensively discussed fall risks/ prevention with dizziness/ low BP, and encouraged patient to keep walker nearby and to have someone stay with patient at all times, if possible, which both patient and caregiver agree with.  Discussed with caregiver that I would place telephone call to patient's PCP to inform him of patient/ caregiver reporting today, and encouraged caregiver to place call to PCP as well, to hopefully schedule sooner post-[name] discharge PCP appointment for general evaluation and medication review; caregiver  agrees to do so promptly.   Discussed with caregiver my general schedule for next week,  and explained that I would have another [name] place call to caregiver next week; caregiver denies further issues, concerns, or problems today. I provided/ confirmed that patient hasmy direct phone number, the main [name] CM office phone number, and the [facility] CM 24-hour nurse advice phone number should issues arise this week.  Encouraged patient and/ or caregiver to contact me directly if needs, questions, issues, or concerns arise for the remainder of this week; caregiver agreed to do so.  Plan:  Patient will take medications and attend all scheduled provider appointments post- [name] discharge  Patient will actively participate with home health nursing services as ordered post-[name] discharge  Patient will resume daily weight monitoring and recording, and will take these values to upcoming provider appointments  Patient/ caregiver will promptly notify care providers for any new concerns/ issues/ or problems  I will make patient's PCP aware of patient/ caregiver reporting of signs/ symptoms low BP and dizziness  [facility] CM outreach to continue with phone call next week    [facility] CM Care Plan Problem One     Most Recent Value  Care Plan Problem One  Risk for [name] readmission related to/ as evidenced by recent hospitalization April 14-19, 2019 for CHF exacerbation   Role Documenting the Problem One  Care Management Coordinator  Care Plan for Problem One  Active  [facility] Long Term Goal   Over the next 31 days, patient will not experience [name] readmission, as evidenced by patient/ caregiver reporting and review of EMR during [place] outreach  [name] Long Term Goal Start Date  11/03/17  Interventions for Problem One Long Term Goal  Discussed with patient and caregiver patient's episode of low BP last night,  discussed possibility of need for medication dosing to be reviewed by PCP,  placed care coordination outreach telephone call to patient's PCP,  provided education to  caregiver regarding action plan for low BP, stressing fall prevention  [facility] CM Short Term Goal #1   Over the next 30 days, patient will actively participate in home health services for nursing, as evidenced by patient/ caregiver reporting and collaboration with home health team, as indicated  [facility] CM Short Term Goal #1 Start Date  11/03/17  Interventions for Short Term Goal #1  Reviewed recent home health visits and ensured that [name] RN is scheduled to visit today,  encouraged caregiver to discuss expectations around dates of services/ disciplines involved    [facility] CM Care Plan Problem Two     Most Recent Value  Care Plan Problem Two  Self-health management deficits for chronic disease state of CHF, as evidenced by recent admission for CHF exacerbation  Role Documenting the Problem Two  Care Management Coordinator  Care Plan for Problem Two  Active  [facility] CM Short Term Goal #1   Over the next 10 days, patient will attend scheduled PCP appointment and will take her daily recorded weights and her current medications with her to appointment, as evidenced by caregiver/ patient reporting and review of EMR during [facility] RN CCM outreach  [facility] CM Short Term Goal #1 Start Date  11/03/17  Interventions for Short Term Goal #2   Encouraged caregiver to contact PCP to inform of patient's episode low BP last night, and to ask for instructions around patient's medications, possibility of scheduling sooner office visit  [facility] CM Short Term Goal #2   Over  the next 30 days, patient will monitor and record daily weights, as evidenced by patient/ caregiver reporting and review of same during [place] outreach  [facility] CM Short Term Goal #2 Start Date  11/03/17  Interventions for Short Term Goal #2  Encouraged patient and caregiver to resume monitoring of daily weights,  reviewed [name] discharge weight with caregiver and patient and discussed need for careful management of fluid balance in setting of low BP in chronic CHF     Oneta Rack, RN, BSN, [employer] Coordinator [name] Care Management  [phone]

## 2017-11-07 ENCOUNTER — Encounter: Payer: Self-pay | Admitting: Family Medicine

## 2017-11-07 ENCOUNTER — Ambulatory Visit (INDEPENDENT_AMBULATORY_CARE_PROVIDER_SITE_OTHER): Payer: PPO | Admitting: Family Medicine

## 2017-11-07 VITALS — BP 138/72 | HR 80 | Temp 97.4°F | Wt 187.0 lb

## 2017-11-07 DIAGNOSIS — I5033 Acute on chronic diastolic (congestive) heart failure: Secondary | ICD-10-CM | POA: Diagnosis not present

## 2017-11-07 DIAGNOSIS — I482 Chronic atrial fibrillation, unspecified: Secondary | ICD-10-CM

## 2017-11-07 DIAGNOSIS — J189 Pneumonia, unspecified organism: Secondary | ICD-10-CM | POA: Diagnosis not present

## 2017-11-07 DIAGNOSIS — K922 Gastrointestinal hemorrhage, unspecified: Secondary | ICD-10-CM | POA: Diagnosis not present

## 2017-11-07 DIAGNOSIS — Z09 Encounter for follow-up examination after completed treatment for conditions other than malignant neoplasm: Secondary | ICD-10-CM

## 2017-11-07 LAB — CBC
HCT: 38.9 % (ref 36.0–46.0)
Hemoglobin: 12.2 g/dL (ref 12.0–15.0)
MCHC: 31.5 g/dL (ref 30.0–36.0)
MCV: 83.8 fl (ref 78.0–100.0)
Platelets: 146 10*3/uL — ABNORMAL LOW (ref 150.0–400.0)
RBC: 4.64 Mil/uL (ref 3.87–5.11)
RDW: 19.4 % — ABNORMAL HIGH (ref 11.5–15.5)
WBC: 7 10*3/uL (ref 4.0–10.5)

## 2017-11-07 LAB — BASIC METABOLIC PANEL
BUN: 25 mg/dL — ABNORMAL HIGH (ref 6–23)
CO2: 26 mEq/L (ref 19–32)
Calcium: 8.9 mg/dL (ref 8.4–10.5)
Chloride: 105 mEq/L (ref 96–112)
Creatinine, Ser: 1.34 mg/dL — ABNORMAL HIGH (ref 0.40–1.20)
GFR: 39.89 mL/min — ABNORMAL LOW (ref >60.00)
Glucose, Bld: 95 mg/dL (ref 70–99)
Potassium: 4.5 mEq/L (ref 3.5–5.1)
Sodium: 142 mEq/L (ref 135–145)

## 2017-11-07 NOTE — Patient Instructions (Addendum)
Given your low blood pressure and dizziness we will hold the Imdur (15 mg).   When you have your follow-up appointment with your cardiologist you can readdress the dosage of the medicine at that time.  You can continue to take diltiazem twice a day.    We will obtain labs today to check your hemoglobin.  We will call you about the results of these tests.  We will plan to restart Eliquis over the weekend.   You can continue your other medications as prescribed.  Try to check your blood pressure twice a day and keep a log of this to bring with you to your next appointment.   Atrial Fibrillation Atrial fibrillation is a type of irregular or rapid heartbeat (arrhythmia). In atrial fibrillation, the heart quivers continuously in a chaotic pattern. This occurs when parts of the heart receive disorganized signals that make the heart unable to pump blood normally. This can increase the risk for stroke, heart failure, and other heart-related conditions. There are different types of atrial fibrillation, including:  Paroxysmal atrial fibrillation. This type starts suddenly, and it usually stops on its own shortly after it starts.  Persistent atrial fibrillation. This type often lasts longer than a week. It Rzepka stop on its own or with treatment.  Long-lasting persistent atrial fibrillation. This type lasts longer than 12 months.  Permanent atrial fibrillation. This type does not go away.  Talk with your health care provider to learn about the type of atrial fibrillation that you have. What are the causes? This condition is caused by some heart-related conditions or procedures, including:  A heart attack.  Coronary artery disease.  Heart failure.  Heart valve conditions.  High blood pressure.  Inflammation of the sac that surrounds the heart (pericarditis).  Heart surgery.  Certain heart rhythm disorders, such as Wolf-Parkinson-White syndrome.  Other causes  include:  Pneumonia.  Obstructive sleep apnea.  Blockage of an artery in the lungs (pulmonary embolism, or PE).  Lung cancer.  Chronic lung disease.  Thyroid problems, especially if the thyroid is overactive (hyperthyroidism).  Caffeine.  Excessive alcohol use or illegal drug use.  Use of some medicines, including certain decongestants and diet pills.  Sometimes, the cause cannot be found. What increases the risk? This condition is more likely to develop in:  People who are older in age.  People who smoke.  People who have diabetes mellitus.  People who are overweight (obese).  Athletes who exercise vigorously.  What are the signs or symptoms? Symptoms of this condition include:  A feeling that your heart is beating rapidly or irregularly.  A feeling of discomfort or pain in your chest.  Shortness of breath.  Sudden light-headedness or weakness.  Getting tired easily during exercise.  In some cases, there are no symptoms. How is this diagnosed? Your health care provider [place] be able to detect atrial fibrillation when taking your pulse. If detected, this condition Campo be diagnosed with:  An electrocardiogram (ECG).  A Holter monitor test that records your heartbeat patterns over a 24-hour period.  Transthoracic echocardiogram (TTE) to evaluate how blood flows through your heart.  Transesophageal echocardiogram (TEE) to view more detailed images of your heart.  A stress test.  Imaging tests, such as a CT scan or chest X-ray.  Blood tests.  How is this treated? The main goals of treatment are to prevent blood clots from forming and to keep your heart beating at a normal rate and rhythm. The type of treatment that  you receive depends on many factors, such as your underlying medical conditions and how you feel when you are experiencing atrial fibrillation. This condition Westman be treated with:  Medicine to slow down the heart rate, bring the heart's rhythm  back to normal, or prevent clots from forming.  Electrical cardioversion. This is a procedure that resets your heart's rhythm by delivering a controlled, low-energy shock to the heart through your skin.  Different types of ablation, such as catheter ablation, catheter ablation with pacemaker, or surgical ablation. These procedures destroy the heart tissues that send abnormal signals. When the pacemaker is used, it is placed under your skin to help your heart beat in a regular rhythm.  Follow these instructions at home:  Take over-the counter and prescription medicines only as told by your health care provider.  If your health care provider prescribed a blood-thinning medicine (anticoagulant), take it exactly as told. Taking too much blood-thinning medicine can cause bleeding. If you do not take enough blood-thinning medicine, you will not have the protection that you need against stroke and other problems.  Do not use tobacco products, including cigarettes, chewing tobacco, and e-cigarettes. If you need help quitting, ask your health care provider.  If you have obstructive sleep apnea, manage your condition as told by your health care provider.  Do not drink alcohol.  Do not drink beverages that contain caffeine, such as coffee, soda, and tea.  Maintain a healthy weight. Do not use diet pills unless your health care provider approves. Diet pills Wilcoxen make heart problems worse.  Follow diet instructions as told by your health care provider.  Exercise regularly as told by your health care provider.  Keep all follow-up visits as told by your health care provider. This is important. How is this prevented?  Avoid drinking beverages that contain caffeine or alcohol.  Avoid certain medicines, especially medicines that are used for breathing problems.  Avoid certain herbs and herbal medicines, such as those that contain ephedra or ginseng.  Do not use illegal drugs, such as cocaine and  amphetamines.  Do not smoke.  Manage your high blood pressure. Contact a health care provider if:  You notice a change in the rate, rhythm, or strength of your heartbeat.  You are taking an anticoagulant and you notice increased bruising.  You tire more easily when you exercise or exert yourself. Get help right away if:  You have chest pain, abdominal pain, sweating, or weakness.  You feel nauseous.  You notice blood in your vomit, bowel movement, or urine.  You have shortness of breath.  You suddenly have swollen feet and ankles.  You feel dizzy.  You have sudden weakness or numbness of the face, arm, or leg, especially on one side of the body.  You have trouble speaking, trouble understanding, or both (aphasia).  Your face or your eyelid droops on one side. These symptoms Clites represent a serious problem that is an emergency. Do not wait to see if the symptoms will go away. Get medical help right away. Call your local emergency services (911 in the [place] Do not drive yourself to the hospital. This information is not intended to replace advice given to you by your health care provider. Make sure you discuss any questions you have with your health care provider. Document Released: 07/01/2005 Document Revised: 11/08/2015 Document Reviewed: 10/26/2014 Elsevier Interactive Patient Education  2018 [place] Pneumonia, Adult Pneumonia is an infection of the lungs. There are different types of  pneumonia. One type can develop while a person is in a hospital. A different type, called community-acquired pneumonia, develops in people who are not, or have not recently been, in the hospital or other health care facility. What are the causes? Pneumonia Guilliams be caused by bacteria, viruses, or funguses. Community-acquired pneumonia is often caused by Streptococcus pneumonia bacteria. These bacteria are often passed from one person to another by breathing in droplets  from the cough or sneeze of an infected person. What increases the risk? The condition is more likely to develop in:  People who havechronic diseases, such as chronic obstructive pulmonary disease (COPD), asthma, congestive heart failure, cystic fibrosis, diabetes, or kidney disease.  People who haveearly-stage or late-stage HIV.  People who havesickle cell disease.  People who havehad their spleen removed (splenectomy).  People who havepoor Human resources officer.  People who havemedical conditions that increase the risk of breathing in (aspirating) secretions their own mouth and nose.  People who havea weakened immune system (immunocompromised).  People who smoke.  People whotravel to areas where pneumonia-causing germs commonly exist.  People whoare around animal habitats or animals that have pneumonia-causing germs, including birds, bats, rabbits, cats, and farm animals.  What are the signs or symptoms? Symptoms of this condition include:  Adry cough.  A wet (productive) cough.  Fever.  Sweating.  Chest pain, especially when breathing deeply or coughing.  Rapid breathing or difficulty breathing.  Shortness of breath.  Shaking chills.  Fatigue.  Muscle aches.  How is this diagnosed? Your health care provider will take a medical history and perform a physical exam. You [place] also have other tests, including:  Imaging studies of your chest, including X-rays.  Tests to check your blood oxygen level and other blood gases.  Other tests on blood, mucus (sputum), fluid around your lungs (pleural fluid), and urine.  If your pneumonia is severe, other tests Ice be done to identify the specific cause of your illness. How is this treated? The type of treatment that you receive depends on many factors, such as the cause of your pneumonia, the medicines you take, and other medical conditions that you have. For most adults, treatment and recovery from pneumonia Baty  occur at home. In some cases, treatment must happen in a hospital. Treatment [place]eg[place] include:  Antibiotic medicines, if the pneumonia was caused by bacteria.  Antiviral medicines, if the pneumonia was caused by a virus.  Medicines that are given by mouth or through an IV tube.  Oxygen.  Respiratory therapy.  Although rare, treating severe pneumonia [place] include:  Mechanical ventilation. This is done if you are not breathing well on your own and you cannot maintain a safe blood oxygen level.  Thoracentesis. This procedureremoves fluid around one lung or both lungs to help you breathe better.  Follow these instructions at home:  Take over-the-counter and prescription medicines only as told by your health care provider. ? Only takecough medicine if you are losing sleep. Understand that cough medicine can prevent your body's natural ability to remove mucus from your lungs. ? If you were prescribed an antibiotic medicine, take it as told by your health care provider. Do not stop taking the antibiotic even if you start to feel better.  Sleep in a semi-upright position at night. Try sleeping in a reclining chair, or place a few pillows under your head.  Do not use tobacco products, including cigarettes, chewing tobacco, and e-cigarettes. If you need help quitting, ask your health care provider.  Drink enough water to keep your urine clear or pale yellow. This will help to thin out mucus secretions in your lungs. How is this prevented? There are ways that you can decrease your risk of developing community-acquired pneumonia. Consider getting a pneumococcal vaccine if:  You are older than 82 years of age.  You are older than 82 years of age and are undergoing cancer treatment, have chronic lung disease, or have other medical conditions that affect your immune system. Ask your health care provider if this applies to you.  There are different types and schedules of pneumococcal vaccines. Ask  your health care provider which vaccination option is best for you. You [place]hey also prevent community-acquired pneumonia if you take these actions:  Get an influenza vaccine every year. Ask your health care provider which type of influenza vaccine is best for you.  Go to the dentist on a regular basis.  Wash your hands often. Use hand sanitizer if soap and water are not available.  Contact a health care provider if:  You have a fever.  You are losing sleep because you cannot control your cough with cough medicine. Get help right away if:  You have worsening shortness of breath.  You have increased chest pain.  Your sickness becomes worse, especially if you are an older adult or have a weakened immune system.  You cough up blood. This information is not intended to replace advice given to you by your health care provider. Make sure you discuss any questions you have with your health care provider. Document Released: 07/01/2005 Document Revised: 11/09/2015 Document Reviewed: 10/26/2014 Elsevier Interactive Patient Education  2018 [name].  Gastrointestinal Bleeding Gastrointestinal bleeding is bleeding somewhere along the path food travels through the body (digestive tract). This path is anywhere between the mouth and the opening of the butt (anus). You [place] have blood in your poop (stools) or have black poop. If you throw up (vomit), there Brosky be blood in it. This condition can be mild, serious, or even life-threatening. If you have a lot of bleeding, you Gouveia need to stay in the hospital. Follow these instructions at home:  Take over-the-counter and prescription medicines only as told by your doctor.  Eat foods that have a lot of fiber in them. These foods include whole grains, fruits, and vegetables. You can also try eating 1-3 prunes each day.  Drink enough fluid to keep your pee (urine) clear or pale yellow.  Keep all follow-up visits as told by your doctor. This is  important. Contact a doctor if:  Your symptoms do not get better. Get help right away if:  Your bleeding gets worse.  You feel dizzy or you pass out (faint).  You feel weak.  You have very bad cramps in your back or belly (abdomen).  You pass large clumps of blood (clots) in your poop.  Your symptoms are getting worse. This information is not intended to replace advice given to you by your health care provider. Make sure you discuss any questions you have with your health care provider. Document Released: 04/09/2008 Document Revised: 12/07/2015 Document Reviewed: 12/19/2014 Elsevier Interactive Patient Education  2018 [name].

## 2017-11-07 NOTE — Progress Notes (Signed)
Subjective:    Patient ID: [name], female    DOB: November 04, 1929, 84 y.o.   MRN: [id number]  No chief complaint on file. Patient accompanied by her daughter Belenda Cruise, who is taking care of her full-time.  HPI Patient was seen today for hospital.  Patient was admitted from 4/14 through 4/19 for acute on chronic CHF exacerbation as well as intractable nausea vomiting.  Pt had an episode of hematemesis and noted to have a superficial ulcer on EGD,that was not actively bleeding.  Imdur was started for stable angina and benazepril was held given bp.  Cardiac cath deferred given potential need for dual antiplatelet therapy and recent GI bleed.  Pt noted to have CAP, d/c'd on Azithromycin x 5 days.  Since d/c pt states she was feeling good for the first few days.  She then began having dizziness and hypotension.  Pt's daughter reports bp was as low as 80s/50s.  Pt was given water to drink which helped bring up the bp.  Pt's daughter states 2/2 this she has been giving pt diltiazem 30 mg BID instead of TID.  After calling the office pt was advised to go to the ED, but refused since she just got out of the hospital.  Per pt's daughter, HH was suppose to draw labs but did not have the paper work with them when they came.  Pt has continued to hold Eliquis.  Pt has Cardiology f/u next wk.  Daughter unsure about GI follow up but will check on this. Past Medical History:  Diagnosis Date  . Adenocarcinoma, colon [facility]) Oncologist-- Dr Truitt Merle   Multifocal (2) Colon cancer @ ileocecal valve and ascending ( [id number]), Stage IIIB, grade I, MMR normal , 2 of 16 +lymph nodes, negative surgical margins---  06-02-2014  Right hemicolectomy w/ colostomy  . Allergy   . Anxiety   . Asthma    inhaler "sometimes"  . CAD (coronary artery disease)    a. LHC 4/09: pLAD 40, mDx 70-75, pLCx 30, mLCx 50, mRCA 90, EF 60% >> PCI: BMS x2 to RCA;  b. Myoview 8/12: normal  . Cataract    bil cateracts removed  . Chronic anemia    . Chronic diastolic CHF (congestive heart failure) (HCC)    a. Echo 912 - Mild LVH, EF 55-60%, no RWMA, Gr 1 DD, mild MR, mild LAE, mild RAE, PASP 59 mmHg (mod to severe pulmo HTN)  . Clotting disorder (HCC)    hx of dvt, tia on eliquis  . Colostomy in place [facility])    s/p colostomy takedown 5/16  . Colovaginal fistula    s/p colostomy >> colostomy takedown 5/16  . COPD with chronic bronchitis ([place])   . Depression   . Dysrhythmia    A fib  . Essential hypertension   . GERD (gastroesophageal reflux disease)   . History of adenomatous polyp of colon   . History of blood transfusion   . History of DVT of lower extremity   . History of hiatal hernia   . History of TIA (transient ischemic attack)    a. Head CT 6/15: small chronic lacunar infarct in thalamus  . Hyperlipidemia   . OA (osteoarthritis)   . Osteoporosis   . Peripheral neuropathy   . Pneumonia   . Pulmonary HTN ([place])    a. PASP on Echo in 9/12:  59 mmHg  . Renal insufficiency   . Sigmoid diverticulosis    s/p sigmoid colectomy  . Stroke [facility])  TIAs  . Wears dentures   . Wears glasses     No Known Allergies  ROS General: Denies fever, chills, night sweats, changes in weight, changes in appetite   +dizzy, fatigued HEENT: Denies headaches, ear pain, changes in vision, rhinorrhea, sore throat CV: Denies CP, palpitations, SOB, orthopnea Pulm: Denies SOB, cough, wheezing GI: Denies abdominal pain, nausea, vomiting, diarrhea, constipation GU: Denies dysuria, hematuria, frequency, vaginal discharge Msk: Denies muscle cramps, joint pains Neuro: Denies weakness, numbness, tingling Skin: Denies rashes, bruising Psych: Denies depression, anxiety, hallucinations     Objective:    Blood pressure 138/72, pulse 80, temperature (!) 97.4 F (36.3 C), temperature source Oral, weight 187 lb (84.8 kg), SpO2 96 %.   Gen. Pleasant, well-nourished, in no distress, normal affect   HEENT: Wearing glasses [place]/AT, face symmetric,  no scleral icterus, PERRLA, nares patent without drainage, pharynx without erythema or exudate. Lungs: no accessory muscle use, CTAB, no wheezes or rales Cardiovascular: RRR, no m/r/g, trace pitting edema in ankles b/l. Neuro:  A&Ox3, CN II-XII intact, gait not assessed, patient sitting in wheelchair.   Wt Readings from Last 3 Encounters:  10/31/17 181 lb 11.2 oz (82.4 kg)  09/03/17 189 lb 12.8 oz (86.1 kg)  09/02/17 191 lb (86.6 kg)    Lab Results  Component Value Date   WBC 6.8 10/31/2017   HGB 11.1 (L) 10/31/2017   HCT 37.4 10/31/2017   PLT 163 10/31/2017   GLUCOSE 102 (H) 10/31/2017   CHOL 130 01/19/2013   TRIG 87.0 01/19/2013   HDL 33.60 (L) 01/19/2013   LDLCALC 79 01/19/2013   ALT 22 10/28/2017   AST 34 10/28/2017   NA 138 10/31/2017   K 3.5 10/31/2017   CL 97 (L) 10/31/2017   CREATININE 1.11 (H) 10/31/2017   BUN 16 10/31/2017   CO2 29 10/31/2017   TSH 4.160 05/31/2014   INR 1.28 10/29/2017   HGBA1C 5.9 (H) 05/31/2014    Assessment/Plan:  Hospital discharge follow-up -Hospital notes reviewed -Post discharge phone message reviewed  Chronic atrial fibrillation ([place]) -CBC obtained, hemoglobin improving, now 12.2 -Continue Cardizem.  Patient and daughter report taking this twice a day instead of 3 times daily. -Patient advised to restart Eliquis -Advised to keep follow-up appointment with cardiology next week  Acute on chronic diastolic CHF (congestive heart failure) ([place]) -continue lasix, lopressor 100 mg BID. -hold imdur 15 mg as causing dizziness. -Readdress restarting Imdur at f/u with Cards next wk.  Discussed med was started given pt's continued chest discomfort.  Community acquired pneumonia, unspecified laterality -improving.  Azithromycin course complete  Upper GI bleed  - Plan: Basic metabolic panel, CBC (no diff) -pt's daughter called regarding lab results.  hgb continues to improve, now 12.2 -continue ferrous sulfate -f/u with GI  F/u with  pcp prn  Grier Mitts, MD

## 2017-11-10 ENCOUNTER — Inpatient Hospital Stay: Payer: PPO | Admitting: Family Medicine

## 2017-11-11 ENCOUNTER — Other Ambulatory Visit: Payer: Self-pay | Admitting: *Deleted

## 2017-11-11 NOTE — Telephone Encounter (Signed)
Decrease metoprolol to 50 mg twice daily Discontinue diltiazem Discontinue isosorbide dinitrate  Office follow-up this week or next

## 2017-11-11 NOTE — Telephone Encounter (Signed)
Spoke to [place] and she is aware of stopping the medications and decreasing Metoprolol to 50 mg.

## 2017-11-11 NOTE — Patient Outreach (Signed)
[facility]) Care Management  11/11/2017  [name] [birth year] [id number]  Successful telephone encounter to caregiver daughter [name] (on [facility] Consent) for [name], 84 year old female - follow up on pt's current  Clinical status as this RN CM covering for pt's primary RN CM [name].  Pt's  Recent hospitalization April 14-19,2019 for fatigue, N/V, Acute on chronic  CHF, and possible CAP.   Spoke with daughter, HIPAA identifiers verified  On pt, discussed purpose of call, covering for pt's primary RN CM [name].   Daughter reports on pt's recent PCP visit last week- pt is to restart Eliquis,  Imdur stopped, Metoprolol decreased to 50 mg and Diltiazem stopped  Today.  Daughter reports she is unable to review pt's complete medication  List as she is currently not at pt's home. Daughter reports pt is taking all  Of her medications, pt's current weight 183 lbs, has a little sob, edema  And chest pain.  Daughter reports pt's BP has been fluctuating up and  Down, becoming normal.  Discussed with daughter primary RN CM  Wanted to know her availability in order to do a home visit with pt to  Which daughter reports she is at pt's home everyday, call her to  Schedule home visit with pt.    Plan:  As discussed with daughter, primary RN CM [name] to follow up with  Her upon her return.           RN CM to provide update on today's telephone encounter with daughter To coworker Whitney Osmond RN CM upon her return.    [name].   [place] [facility]  [phone]

## 2017-11-11 NOTE — Telephone Encounter (Signed)
Did not leave a detailed message on Laine's phone due to her vm stating that she will be out of the office from 11/10/2017-11/17/2017. Since this is urgent I called pt daughter and left a vm for a return phone call. Also calling patient.

## 2017-11-11 NOTE — Telephone Encounter (Signed)
Daughter called back. Please call again.  Pt is staying with her now, so pt is there. Cell phone is the best number.

## 2017-11-11 NOTE — Telephone Encounter (Signed)
Patient is schedule for follow-up visit as well

## 2017-11-12 ENCOUNTER — Encounter: Payer: Self-pay | Admitting: Physician Assistant

## 2017-11-12 ENCOUNTER — Ambulatory Visit: Payer: PPO | Admitting: Physician Assistant

## 2017-11-12 ENCOUNTER — Other Ambulatory Visit: Payer: Self-pay

## 2017-11-12 VITALS — BP 114/64 | HR 100 | Ht 64.0 in | Wt 186.5 lb

## 2017-11-12 DIAGNOSIS — I5033 Acute on chronic diastolic (congestive) heart failure: Secondary | ICD-10-CM | POA: Diagnosis not present

## 2017-11-12 DIAGNOSIS — I2510 Atherosclerotic heart disease of native coronary artery without angina pectoris: Secondary | ICD-10-CM | POA: Diagnosis not present

## 2017-11-12 DIAGNOSIS — R42 Dizziness and giddiness: Secondary | ICD-10-CM

## 2017-11-12 DIAGNOSIS — I481 Persistent atrial fibrillation: Secondary | ICD-10-CM | POA: Diagnosis not present

## 2017-11-12 DIAGNOSIS — I4819 Other persistent atrial fibrillation: Secondary | ICD-10-CM

## 2017-11-12 MED ORDER — LORAZEPAM 0.5 MG PO TABS: ORAL_TABLET | ORAL | 0 refills | Status: DC

## 2017-11-12 MED ORDER — METOPROLOL TARTRATE 50 MG PO TABS: 75.0000 mg | ORAL_TABLET | Freq: Two times a day (BID) | ORAL | 3 refills | Status: DC

## 2017-11-12 MED ORDER — DILTIAZEM HCL ER COATED BEADS 120 MG PO CP24: ORAL_CAPSULE | ORAL | 3 refills | Status: DC

## 2017-11-12 NOTE — Patient Instructions (Signed)
Medication Instructions: Your physician has recommended you make the following change in your medication:  -1) TAKE Cardizem 120 mg - Take 1 capsule by mouth as needed when Heart Rate is above 335 and Systolic Blood Pressure is above 100 mm/hg  -2) INCREASE Metoprolol Tartrate (Lopressor) 75 mg - Take 1.5 tablets (75 mg) by mouth twice daily  Labwork: None Ordered  Procedures/Testing: None Ordered  Follow-Up: Your physician recommends that you schedule a follow-up appointment in 2-3 WEEKS with Vin Bhagat, PA-C   Any Additional Special Instructions Will Be Listed Below (If Applicable).  -- PLEASE CHECK YOUR WEIGHT DAILY AND BLOOD PRESSURE DAILY --  If you need a refill on your cardiac medications before your next appointment, please call your pharmacy.

## 2017-11-12 NOTE — Progress Notes (Signed)
Cardiology Office Note    Date:  11/12/2017   ID:  [name], DOB [birth year], MRN [id number]  PCP:  Whitney Lor, MD  Cardiologist:  Dr. Acie Fredrickson  Chief Complaint: Hospital follow up   History of Present Illness:   [name] is a 84 y.o. female with a hx of CAD s/p BMS to RCA x 2 in 2671, diastolic CHF,mitral regurgitation,HTN, HLD, persistent afib on Eliquis, chronic dyspnea on exertion, COPD, pulmonary HTN, prior TIA, prior DVT and h/ocolon CA presents for hospital follow up.   In January 2019 she had worsening dyspnea and volume overload. 2D echo was ordered 08/15/17 and showed normal LVEF at 55-60%. She was noted to have moderate MR. Images were reviewed by both Dr. Acie Fredrickson and Dr. Burt Knack and it was felt that her MR was not severe enough to warrant further evaluation for mitral valve clipping.   NST 08/2017 showed a small area of moderate ischemia in the distal anterior and anterolateral, septal and apical walls. [name] could not exclude some shifting breast attenuation. The study was felt to be intermediate risk, however medical therapy was elected as pt only with exertional symptoms with moderate to heavy activity.   Admitted 10/2017 for CAP and acute on chronic diastolic HF, also found to be anemic and + FOBT. Cardiology seen the patient for chest pain. EKGwithout acute ST-T wave changes, atrial fibrillation. Chest pain improved on Imdur '15mg'$  qd. She is not interested in pursuing further ischemic workup. Endoscopy showed superficial small gastric ulcers with no high risk stigmata of bleeding. Held Eliquis until follow up with PCP.   Seen by PCP and started back on Eliquis.   Here today for follow up. She can not lay flat due to SOB and orthopnea however weight is trending down per home scale.  No recurrent bleeding.  She is off isosorbide due to dizziness.  She complains of dizziness with standing up.  Dizziness better but pain is slightly worse while taken off Imdur.  Compliant with low-sodium diet.  Denies chest pain.  Slowly gaining her strength.  No syncope.  Does feels intermittent palpitation.  Blood pressure in 110-130 over 70s.  A telephone note yesterday PCP instructed to reduce metoprolol to 50 mg daily and stop diltiazem.  However noted heart rate of 100s this morning.  She did feel palpitations this morning.  Past Medical History:  Diagnosis Date  . Adenocarcinoma, colon [facility]) Oncologist-- Dr Truitt [name]   Multifocal (2) Colon cancer @ ileocecal valve and ascending ( [id number]), Stage IIIB, grade I, MMR normal , 2 of 16 +lymph nodes, negative surgical margins---  06-02-2014  Right hemicolectomy w/ colostomy  . Allergy   . Anxiety   . Asthma    inhaler "sometimes"  . CAD (coronary artery disease)    a. LHC 4/09: pLAD 40, mDx 70-75, pLCx 30, mLCx 50, mRCA 90, EF 60% >> PCI: BMS x2 to RCA;  b. Myoview 8/12: normal  . Cataract    bil cateracts removed  . Chronic anemia   . Chronic diastolic CHF (congestive heart failure) (HCC)    a. Echo 912 - Mild LVH, EF 55-60%, no RWMA, Gr 1 DD, mild MR, mild LAE, mild RAE, PASP 59 mmHg (mod to severe pulmo HTN)  . Clotting disorder (HCC)    hx of dvt, tia on eliquis  . Colostomy in place [facility])    s/p colostomy takedown 5/16  . Colovaginal fistula    s/p colostomy >> colostomy takedown  5/16  . COPD with chronic bronchitis ([place])   . Depression   . Dysrhythmia    A fib  . Essential hypertension   . GERD (gastroesophageal reflux disease)   . History of adenomatous polyp of colon   . History of blood transfusion   . History of DVT of lower extremity   . History of hiatal hernia   . History of TIA (transient ischemic attack)    a. Head CT 6/15: small chronic lacunar infarct in thalamus  . Hyperlipidemia   . OA (osteoarthritis)   . Osteoporosis   . Peripheral neuropathy   . Pneumonia   . Pulmonary HTN ([place])    a. PASP on Echo in 9/12:  59 mmHg  . Renal insufficiency   . Sigmoid diverticulosis    s/p  sigmoid colectomy  . Stroke ([place])    TIAs  . Wears dentures   . Wears glasses     Past Surgical History:  Procedure Laterality Date  . ABDOMINAL HYSTERECTOMY    . ANTERIOR CERVICAL DECOMP/DISCECTOMY FUSION  01-31-2009   C5 -- 7  . APPENDECTOMY    . Bone spurs Bilateral    Feet  . CARDIOVASCULAR STRESS TEST  02-26-2011   Normal lexiscan no exercise study/  no ischemia/  normal LV function and wall motion, ef 67%  . CARPAL TUNNEL RELEASE Right   . CATARACT EXTRACTION W/ INTRAOCULAR LENS  IMPLANT, BILATERAL Bilateral   . CHOLECYSTECTOMY    . COLOSTOMY N/A 06/02/2014   Procedure: DIVERTING DESCENDING END COLOSTOMY;  Surgeon: Donnie Mesa, MD;  Location: [place];  Service: General;  Laterality: N/A;  . CORONARY ANGIOPLASTY WITH STENT PLACEMENT  11-04-2007  dr bensimhon   BMS x2 to RCA/  mild Non-obstructive disease LAD/  normal LVF  . DILATION AND CURETTAGE OF UTERUS    . ESOPHAGOGASTRODUODENOSCOPY N/A 10/28/2017   Procedure: ESOPHAGOGASTRODUODENOSCOPY (EGD);  Surgeon: Yetta Flock, MD;  Location: [facility] ENDOSCOPY;  Service: Gastroenterology;  Laterality: N/A;  . EVALUATION UNDER ANESTHESIA WITH ANAL FISTULECTOMY N/A 10/13/2014   Procedure: ANAL EXAM UNDER ANESTHESIA ;  Surgeon: [name], MD;  Location: [facility];  Service: General;  Laterality: N/A;  . HAND SURGERY     Tendon repair  . LAPAROSCOPIC SIGMOID COLECTOMY N/A 11/24/2014   Procedure: SIGMOID COLECTOMY AND COLSOTOMY CLOSURE;  Surgeon: Donnie Mesa, MD;  Location: [place];  Service: General;  Laterality: N/A;  . LUMBAR LAMINECTOMY  10-29-2002,  1969   Left  L3 -- 4  decompression  . PARTIAL COLECTOMY N/A 06/02/2014   Procedure: RIGHT PARTIAL COLECTOMY ;  Surgeon: Donnie Mesa, MD;  Location: [place];  Service: General;  Laterality: N/A;  . PATELLECTOMY Right 09/14/2013   Procedure: PATELLECTOMY;  Surgeon: Meredith Pel, MD;  Location: [place];  Service: Orthopedics;  Laterality: Right;  . PROCTOSCOPY N/A 10/13/2014    Procedure: RIDGE PROCTOSCOPY;  Surgeon: [name], MD;  Location: [facility];  Service: General;  Laterality: N/A;  . REVISION TOTAL KNEE ARTHROPLASTY Bilateral right  04-02-2011/  left 1996 & 10-05-1999  . ROTATOR CUFF REPAIR Bilateral   . TONSILLECTOMY    . TOTAL KNEE ARTHROPLASTY Bilateral left 1994/  right 2000  . TOTAL KNEE REVISION Left 08/02/2015   Procedure: LEFT FEMORAL REVISION;  Surgeon: Gaynelle Arabian, MD;  Location: [facility];  Service: Orthopedics;  Laterality: Left;  . TRANSTHORACIC ECHOCARDIOGRAM  12-01-2009   Grade I diastolic dysfunction/  ef 60%/  moderate MR/  mild TR    Current  Medications: Prior to Admission medications   Medication Sig Start Date End Date Taking? Authorizing Provider  acetaminophen (TYLENOL) 325 MG tablet Take 650 mg by mouth every 6 (six) hours as needed for moderate pain.     [provider]  Ascorbic Acid (VITAMIN C) 1000 MG tablet Take 1,000 mg by mouth daily.    [provider]  diltiazem (CARDIZEM) 30 MG tablet Take 1 tablet (30 mg total) by mouth every 8 (eight) hours. 10/23/16   Whitney Lor, MD  esomeprazole (NEXIUM) 40 MG capsule Take 1 capsule (40 mg total) by mouth daily. 10/31/17   Patrecia Pour, MD  ferrous sulfate 325 (65 FE) MG EC tablet Take 325 mg by mouth daily with breakfast.    [provider]  furosemide (LASIX) 20 MG tablet Take 1 tablet (20 mg total) by mouth daily. 08/06/17 11/04/17  Lyda Jester M, PA-C  gabapentin (NEURONTIN) 600 MG tablet Take 600 mg by mouth 2 (two) times daily.    [provider]  HYDROcodone-acetaminophen (NORCO/VICODIN) 5-325 MG tablet Take 1-2 tablets by mouth every 6 (six) hours as needed for moderate pain. 08/04/17   Whitney Lor, MD  isosorbide mononitrate (IMDUR) 30 MG 24 hr tablet Take 0.5 tablets (15 mg total) by mouth daily. 11/01/17   Patrecia Pour, MD  LORazepam (ATIVAN) 0.5 MG tablet TAKE 1 TABLET BY MOUTH TWICE DAILY AS NEEDED FOR ANXIETY OR  SLEEP  11/12/17   Whitney Lor, MD  metoprolol (LOPRESSOR) 100 MG tablet Take 1 tablet (100 mg total) by mouth 2 (two) times daily. 10/23/16   Whitney Lor, MD  nitroGLYCERIN (NITROSTAT) 0.4 MG SL tablet Place 1 tablet (0.4 mg total) under the tongue every 5 (five) minutes as needed for chest pain (x 3 pills). Reported on 09/01/2015 10/23/16   Whitney Lor, MD  promethazine (PHENERGAN) 12.5 MG tablet TAKE 1 TABLET (12.5 MG TOTAL) BY MOUTH EVERY 8 HOURS AS NEEDED FOR NAUSEA OR VOMITING. Patient taking differently: Take 12.5 mg by mouth every 8 (eight) hours as needed for nausea or vomiting.  10/23/16   Whitney Lor, MD  sertraline (ZOLOFT) 50 MG tablet Take 1 tablet (50 mg total) by mouth daily. 10/23/16   Whitney Lor, MD  traZODone (DESYREL) 100 MG tablet Take 0.5 tablets (50 mg total) by mouth at bedtime. 10/23/16   Whitney Lor, MD  triamcinolone cream (KENALOG) 0.1 % Apply 1 application topically 2 (two) times daily as needed (Eczema on legs). 10/23/16   Whitney Lor, MD    Allergies:   Patient has no known allergies.   Social History   Socioeconomic History  . Marital status: Widowed    Spouse name: Not on file  . Number of children: Not on file  . Years of education: Not on file  . Highest education level: Not on file  Occupational History  . Not on file  Social Needs  . Financial resource strain: Not on file  . Food insecurity:    Worry: Not on file    Inability: Not on file  . Transportation needs:    Medical: Not on file    Non-medical: Not on file  Tobacco Use  . Smoking status: Former Smoker    Packs/day: 0.50    Years: 15.00    Pack years: 7.50    Types: Cigarettes    Last attempt to quit: 07/16/1967    Years since quitting: 50.3  . Smokeless tobacco: Never  Used  Substance and Sexual Activity  . Alcohol use: No    Alcohol/week: 0.0 oz  . Drug use: No  . Sexual activity: Not on file  Lifestyle  . Physical activity:    Days  per week: Not on file    Minutes per session: Not on file  . Stress: Not on file  Relationships  . Social connections:    Talks on phone: Not on file    Gets together: Not on file    Attends religious service: Not on file    Active member of club or organization: Not on file    Attends meetings of clubs or organizations: Not on file    Relationship status: Not on file  Other Topics Concern  . Not on file  Social History Narrative   Married, 2 children.  As of November 2015 her husband has been living in a nursing home for tendon after years as he suffers from multiple medical problems.   Patient does not drink alcohol nor does she smoke or chew tobacco products   Right handed   8th grade   1 cup daily     Family History:  The patient's family history includes Cancer (age of onset: 63) in her sister; Colon cancer (age of onset: 28) in her maternal uncle; Heart Problems in her father; Heart failure in her mother; Kidney cancer (age of onset: 23) in her brother; Osteoarthritis in her mother.   ROS:   Please see the history of present illness.    ROS All other systems reviewed and are negative.   PHYSICAL EXAM:   VS:  BP 114/64   Pulse 100   Ht '5\' 4"'$  (1.626 m)   Wt 186 lb 8 oz (84.6 kg)   SpO2 97%   BMI 32.01 kg/m    GEN: Well nourished, well developed, in no acute distress  HEENT: normal  Neck: no JVD, carotid bruits, or masses Cardiac: IR IR tachycardic; systolic murmurs, rubs, or gallops, Trace BL LE edema  Respiratory:  clear to auscultation bilaterally, normal work of breathing GI: soft, nontender, nondistended, + BS MS: no deformity or atrophy  Skin: warm and dry, no rash Neuro:  Alert and Oriented x 3, Strength and sensation are intact Psych: euthymic mood, full affect  Wt Readings from Last 3 Encounters:  11/12/17 186 lb 8 oz (84.6 kg)  11/07/17 187 lb (84.8 kg)  10/31/17 181 lb 11.2 oz (82.4 kg)      Studies/Labs Reviewed:   EKG:  EKG is not ordered today.     Recent Labs: 08/04/2017: Pro B Natriuretic peptide (BNP) 366.0 10/26/2017: B Natriuretic Peptide 478.6 10/28/2017: ALT 22 10/30/2017: Magnesium 2.1 11/07/2017: BUN 25; Creatinine, Ser 1.34; Hemoglobin 12.2; Platelets 146.0; Potassium 4.5; Sodium 142   Lipid Panel    Component Value Date/Time   CHOL 130 01/19/2013 1014   TRIG 87.0 01/19/2013 1014   HDL 33.60 (L) 01/19/2013 1014   CHOLHDL 4 01/19/2013 1014   VLDL 17.4 01/19/2013 1014   LDLCALC 79 01/19/2013 1014    Additional studies/ records that were reviewed today include:   Echocardiogram 10/28/17: Study Conclusions  - Left ventricle: The cavity size was normal. There was severe focal basal and moderate concentric hypertrophy. Systolic function was normal. The estimated ejection fraction was in the range of 55% to 60%. Wall motion was normal; there were no regional wall motion abnormalities. The study was not technically sufficient to allow evaluation of LV diastolic dysfunction due to atrial  fibrillation. - Aortic valve: Mildly to moderately calcified annulus. Trileaflet; normal thickness, mildly calcified leaflets. - Mitral valve: There was moderate regurgitation. - Left atrium: The atrium was severely dilated. - Tricuspid valve: There was moderate regurgitation. - Pulmonic valve: There was trivial regurgitation. - Pulmonary arteries: PA peak pressure: 50 mm Hg (S).  Impressions:  - The right ventricular systolic pressure was increased consistent with moderate pulmonary hypertension.   2D Echo 08/15/17 Study Conclusions  - Left ventricle: The cavity size was normal. There was mild focal basal hypertrophy of the septum. Systolic function was normal. The estimated ejection fraction was in the range of 55% to 60%. Wall motion was normal; there were no regional wall motion abnormalities. The study is not technically sufficient to allow evaluation of LV diastolic function. - Aortic  valve: Trileaflet; mildly thickened, mildly calcified leaflets. - Mitral valve: There was moderate regurgitation. - Left atrium: The atrium was severely dilated. - Right atrium: The atrium was severely dilated. - Tricuspid valve: There was moderate regurgitation. - Pulmonary arteries: Systolic pressure was moderately increased. PA peak pressure: 54 mm Hg (S).  NST 08/15/17 Study Highlights    Nuclear stress EF: 52%.  Small region of moderate ischemia in the distal anterior, anterolateral, septal and apical walls. Cannot exclude some shifting breast attenuation.  There was no ST segment deviation noted during stress.  This is an intermediate risk study.    ASSESSMENT & PLAN:    1. Acute on chronic diastolic CHF - Mild volume overload by exam. She has orthopnea.  Weight trending down. Will continue lasix at current dose. Would not treat aggressively.   2. Persistent atrial fibrillation - HR of 100s today. Did felt palpitation on lower dose of metoprolol '50mg'$ . See below. Continue Eliquis.   3. CAD s/p BMS to RCA x 2 in 2009 - Recent stress test was intermediate risk. Chest pain improved on Imdur however now taken off due to dizziness. She is not interested in pursuing further ischemic workup.   4. Mitral Regurgitation: -Moderate by recent echo. Not severe enough to be considered for MV clip at that time.  5. Upper GI bleed - Recent Hgb stable. On back on Eliquis. No recurrent bleeding or melena.   6. Dizziness - She is not orthostatic by vital however did felt dizzy and SOB while standing up. Her dizziness has improved after DC of Imdur but recurrent mild chest discomfort. Given palpitation will increase metoprolol to '75mg'$  BID. Only take cardizem '30mg'$  when HR above 120 bpm and SBP > 100. Seems her dizziness is combination of orthostatics and medications. Reviewed with pharmacy. Her dizziness could be due to gabapentin, Trazodone and sertraline (takes PRN). Will send message  to PCP if can be taken off any of above these 3 medications.   Plan discussed with daughter and grand daughter who were present during visit.    Medication Adjustments/Labs and Tests Ordered: Current medicines are reviewed at length with the patient today.  Concerns regarding medicines are outlined above.  Medication changes, Labs and Tests ordered today are listed in the Patient Instructions below. Patient Instructions  Medication Instructions: Your physician has recommended you make the following change in your medication:  -1) TAKE Cardizem 120 mg - Take 1 capsule by mouth as needed when Heart Rate is above 161 and Systolic Blood Pressure is above 100 mm/hg  -2) INCREASE Metoprolol Tartrate (Lopressor) 75 mg - Take 1.5 tablets (75 mg) by mouth twice daily  Labwork: None Ordered  Procedures/Testing: None  Ordered  Follow-Up: Your physician recommends that you schedule a follow-up appointment in 2-3 WEEKS with Vin Meleane Selinger, PA-C   Any Additional Special Instructions Will Be Listed Below (If Applicable).  -- PLEASE CHECK YOUR WEIGHT DAILY AND BLOOD PRESSURE DAILY --  If you need a refill on your cardiac medications before your next appointment, please call your pharmacy.      Jarrett Soho, [place]  11/12/2017 12:32 PM    [facility] [address], [place], [place]  [address] Phone: [phone]; Fax: [phone]

## 2017-11-17 ENCOUNTER — Encounter: Payer: Self-pay | Admitting: Internal Medicine

## 2017-11-17 ENCOUNTER — Telehealth: Payer: Self-pay

## 2017-11-17 ENCOUNTER — Ambulatory Visit (INDEPENDENT_AMBULATORY_CARE_PROVIDER_SITE_OTHER): Payer: PPO | Admitting: Internal Medicine

## 2017-11-17 VITALS — BP 120/60 | HR 89 | Temp 97.5°F | Wt 185.0 lb

## 2017-11-17 DIAGNOSIS — I10 Essential (primary) hypertension: Secondary | ICD-10-CM

## 2017-11-17 DIAGNOSIS — Z7901 Long term (current) use of anticoagulants: Secondary | ICD-10-CM | POA: Diagnosis not present

## 2017-11-17 DIAGNOSIS — I5032 Chronic diastolic (congestive) heart failure: Secondary | ICD-10-CM

## 2017-11-17 DIAGNOSIS — I2510 Atherosclerotic heart disease of native coronary artery without angina pectoris: Secondary | ICD-10-CM

## 2017-11-17 DIAGNOSIS — E7800 Pure hypercholesterolemia, unspecified: Secondary | ICD-10-CM | POA: Diagnosis not present

## 2017-11-17 DIAGNOSIS — C182 Malignant neoplasm of ascending colon: Secondary | ICD-10-CM | POA: Diagnosis not present

## 2017-11-17 DIAGNOSIS — I482 Chronic atrial fibrillation, unspecified: Secondary | ICD-10-CM

## 2017-11-17 NOTE — Patient Instructions (Signed)
Limit your sodium (Salt) intake  Cardiology follow-up as scheduled  Please check your blood pressure on a regular basis.  If it is consistently greater than 150/90, please make an office appointment.  Return in 3 months for follow-up

## 2017-11-17 NOTE — Progress Notes (Signed)
   Subjective:    Patient ID: [name], female    DOB: 1930-02-24, 84 y.o.   MRN: [id number]  HPI  Admit date: 10/26/2017 Discharge date: 10/31/2017  Admitted From: Home Disposition: Home   Recommendations for Outpatient Follow-up:  1. Follow up with PCP in 1-2 weeks.  2. Follow up with home health RN, to draw CBC in the next week, send results to PCP who can inform decision to restart eliquis.  3. Please obtain CBC in one week 4. Follow up EGD biopsy results and follow up with GI. Continuing PPI.  5. Cardiology to arrange hospital follow up appointment.  Discharge Diagnoses:  Principal Problem:   Acute on chronic diastolic CHF (congestive heart failure) (HCC) Active Problems:   Essential hypertension   GERD   Lethargy   Anxiety and depression   Cancer of ascending colon (HCC)   Chronic atrial fibrillation (HCC)   Coronary artery disease of native artery of native heart with stable angina pectoris (HCC)   Nausea and vomiting   Iron deficiency anemia   Chronic anticoagulation   CAP (community acquired pneumonia)   Thrombocytopenia (HCC)   Abnormal stress test   Moderate mitral regurgitation   Upper GI bleed   Gastric ulcer   Atrial fibrillation ([place])    Review of Systems     Objective:   Physical Exam        Assessment & Plan:

## 2017-11-18 NOTE — Telephone Encounter (Signed)
That is fine 

## 2017-11-18 NOTE — Telephone Encounter (Signed)
Thank you! I will let the patient know.

## 2017-11-19 ENCOUNTER — Encounter: Payer: Self-pay | Admitting: *Deleted

## 2017-11-19 ENCOUNTER — Other Ambulatory Visit: Payer: Self-pay | Admitting: *Deleted

## 2017-11-19 NOTE — Patient Outreach (Signed)
[facility]) Care Management [place] Telephone Outreach Post-hospital discharge day 20  11/19/2017  [name] Oscar 1929/12/07 [id number]  Successful telephone outreach to [facility], caregiver/ daughter, on [facility] CM written consent, of [name], 84 y/o female referred to [place] after recent hospitalization April 14-19, 2019 for fatigue, N/V, Acute on chronic CHF, and possible CAP.Patient was discharged home with home health services for nursing only, as she refused SNF placement for short-term rehabilitation and home health PT services. Patient has history including, but not limited to, CAD; dCHF; A-Fib, on ACT; GERD; and HTN/ HLD. HIPAA/ identity verified with patient's caregiver and with patient today, with phone on speaker mode.  Today, patient and caregiver report that patient "is much much better, not having any problems."  Caregiver denies that patient is in distress today, and patient does not sound to be in distress when she speaks when phone on speaker mode.  Patient/ caregiver futher report:  -- attended recent PCP office visits x 2, with last office visit 11/17/17:  "got a good report," stated doctor will see patient again in 3 months, unless problems arise; upcoming scheduled provider visits briefly reviewed, and daughter confirms that she will continue to provide transportation to all scheduled appointments and will attend appointments with patient  -- has all medications and takes as prescribed; deny questions/ concerns around patient's current medications- verbalizes that recent changes made have helped patient's BP and that patient has not experienced recent dizziness or extreme BP fluctuations, as she had immediately after hospital discharge.  Patient and caregiver/ daughter decline medication review today, as they are currently not at home and are driving in their car.  Reports patient continues independently preparing and managing her own  medications using weekly pill box; discussed need to complete medication review as soon as possible, and patient agrees to this at the time of scheduled [place] in-home visit next week.  -- home health services continue, "going well;" reports last home health RN visit on 11/14/17; waiting to hear from home health staff for this week's visit; daughter reports that she believes patient is "doing so well," she [place] not need home health services for much longer  -- patient now living with daughter at: [address] [place], [place] [address], daughter continuing to work "for now," but reports patient has plenty of help thorughout day with friends and family members, and continues to deny community resource needs.  -- Self-health management of chronic disease state of CHF: ---- has begun/ continued monitoring and recording daily weights:  Reports daily weight ranges consistently between 180-182 lbs with weight this morning of 181 lbs ---- denies issues around breathing status, patient states "breathing just fine." ---- denies issues around dizziness/ low BP, as previously reported:  Patient states "that has all resolved, not having any more problems."  Encouraged patient/ caregiver to continue monitoring and recording daily weights, BP's SaO2 levels, and to promptly contact providers for any concerns/ issues/ problems.  Caregiver and patient verbalize understanding and agree to do so ---- reports "good appetite, eating well."   Patient and caregiverdeny further issues, concerns, or problems today. Iconfirmed that patient hasmy direct phone number, the main [facility] CM office phone number, and the [facility] CM 24-hour nurse advice phone number should issues prior to next [place] outreach, and we scheduled initial home visit for next week. Encouraged patient and/ or caregiverto contact me directly if needs, questions, issues, or concerns arise prior to home visit next  week; patient and caregiveragreed to  do so.  Plan:  Patient will take medications and attend all scheduled provider appointments post- hospital discharge  Patient will actively participate with home health nursing services as ordered post-hospital discharge  Patient will continue daily weight and BP monitoring and recording  Patient/ caregiver will promptly notify care providers for any new concerns/ issues/ or problems  [facility] CM outreach to continue with scheduled initial home visit next week   [facility] CM Care Plan Problem One     Most Recent Value  Care Plan Problem One  Risk for hospital readmission related to/ as evidenced by recent hospitalization April 14-19, 2019 for CHF exacerbation   Role Documenting the Problem One  Care Management Coordinator  Care Plan for Problem One  Active  [facility] Long Term Goal   Over the next 31 days, patient will not experience hospital readmission, as evidenced by patient/ caregiver reporting and review of EMR during [place] outreach  [facility] Long Term Goal Start Date  11/03/17  Interventions for Problem One Long Term Goal  Discussed patient's current clinical status with patient and caregiver,  confirmed that patient has no questions around medications changes and continues monitoring and recording daily weights and blood pressures,  [facility] CM initial home visit scheduled  [facility] CM Short Term Goal #1   Over the next 30 days, patient will actively participate in home health services for nursing, as evidenced by patient/ caregiver reporting and collaboration with home health team, as indicated  [facility] CM Short Term Goal #1 Start Date  11/03/17  Interventions for Short Term Goal #1  discussed with patient and caregiver recent home health visits,  confirmed that home health remains involved in patient's care    [facility] CM Care Plan Problem Two     Most Recent Value  Care Plan Problem Two  Self-health management deficits for chronic disease state of CHF, as evidenced by recent admission  for CHF exacerbation  Role Documenting the Problem Two  Care Management Coordinator  Care Plan for Problem Two  Active  [facility] CM Short Term Goal #1   Over the next 10 days, patient will attend scheduled PCP appointment and will take her daily recorded weights and her current medications with her to appointment, as evidenced by caregiver/ patient reporting and review of EMR during [facility] RN CCM outreach  [facility] CM Short Term Goal #1 Start Date  11/03/17  [facility] CM Short Term Goal #1 Met Date   11/19/17 - Goal met  Interventions for Short Term Goal #2   Confirmed that patient has attended 2 PCP office visits post-most recent hospital discharge,  last office visit on 11/17/17,  reviewed office visit outcome with patient and caregiver  [facility] CM Short Term Goal #2   Over the next 30 days, patient will monitor and record daily weights, as evidenced by patient/ caregiver reporting and review of same during [facility] RN CCM outreach  [facility] CM Short Term Goal #2 Start Date  11/03/17  Interventions for Short Term Goal #2  Confirmed that patient continues monitoring and recording daily weights,  reviewed recent weights with patient and caregiver today,  encouraged caregiver and patient to continue this practice and provided positive reinforcement      Oneta Rack, RN, BSN, [place] Coordinator [facility] Care Management  [phone]

## 2017-11-20 DIAGNOSIS — I482 Chronic atrial fibrillation: Secondary | ICD-10-CM | POA: Diagnosis not present

## 2017-11-20 DIAGNOSIS — I110 Hypertensive heart disease with heart failure: Secondary | ICD-10-CM | POA: Diagnosis not present

## 2017-11-20 DIAGNOSIS — F329 Major depressive disorder, single episode, unspecified: Secondary | ICD-10-CM | POA: Diagnosis not present

## 2017-11-25 DIAGNOSIS — F329 Major depressive disorder, single episode, unspecified: Secondary | ICD-10-CM | POA: Diagnosis not present

## 2017-11-25 DIAGNOSIS — I110 Hypertensive heart disease with heart failure: Secondary | ICD-10-CM | POA: Diagnosis not present

## 2017-11-25 DIAGNOSIS — I482 Chronic atrial fibrillation: Secondary | ICD-10-CM | POA: Diagnosis not present

## 2017-11-26 ENCOUNTER — Telehealth: Payer: Self-pay | Admitting: Internal Medicine

## 2017-11-26 DIAGNOSIS — H532 Diplopia: Secondary | ICD-10-CM | POA: Diagnosis not present

## 2017-11-26 DIAGNOSIS — H04123 Dry eye syndrome of bilateral lacrimal glands: Secondary | ICD-10-CM | POA: Diagnosis not present

## 2017-11-26 DIAGNOSIS — H5052 Exophoria: Secondary | ICD-10-CM | POA: Diagnosis not present

## 2017-11-26 DIAGNOSIS — H10522 Angular blepharoconjunctivitis, left eye: Secondary | ICD-10-CM | POA: Diagnosis not present

## 2017-11-26 DIAGNOSIS — I63532 Cerebral infarction due to unspecified occlusion or stenosis of left posterior cerebral artery: Secondary | ICD-10-CM | POA: Diagnosis not present

## 2017-11-26 DIAGNOSIS — H5022 Vertical strabismus, left eye: Secondary | ICD-10-CM | POA: Diagnosis not present

## 2017-11-26 DIAGNOSIS — Z961 Presence of intraocular lens: Secondary | ICD-10-CM | POA: Diagnosis not present

## 2017-11-26 DIAGNOSIS — H353131 Nonexudative age-related macular degeneration, bilateral, early dry stage: Secondary | ICD-10-CM | POA: Diagnosis not present

## 2017-11-26 NOTE — Telephone Encounter (Signed)
Okay to continue orders?  ?

## 2017-11-26 NOTE — Telephone Encounter (Signed)
Copied from [place] [phone]. Topic: Quick Communication - See Telephone Encounter >> [place]oo 15, 2019  2:26 PM R[place]ford Nail, [place] wrote: CRM for notification. See Telephone encounter for: 11/26/17. Nicolette calling from [facility] and states that Verbal Orders are needed to continue home health skilled nursing for disease management and education. 1x a week for 4 weeks 2 as needed visits  States that the patient also has a telehealth unit in home [facility])  CB#:[phone]

## 2017-11-27 ENCOUNTER — Other Ambulatory Visit: Payer: Self-pay | Admitting: *Deleted

## 2017-11-27 ENCOUNTER — Encounter: Payer: Self-pay | Admitting: *Deleted

## 2017-11-27 NOTE — Telephone Encounter (Signed)
Okay for verbal 
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
## 2017-11-28 NOTE — Telephone Encounter (Signed)
Spoke to [facility] and she stated that the visit will not start until 12/04/2017.

## 2017-11-28 NOTE — Telephone Encounter (Signed)
Okay to continue orders?  ?

## 2017-12-01 ENCOUNTER — Ambulatory Visit: Payer: PPO | Admitting: Internal Medicine

## 2017-12-03 ENCOUNTER — Encounter: Payer: Self-pay | Admitting: Physician Assistant

## 2017-12-03 ENCOUNTER — Ambulatory Visit: Payer: PPO | Admitting: Physician Assistant

## 2017-12-03 VITALS — BP 100/66 | HR 83 | Ht 64.0 in | Wt 186.8 lb

## 2017-12-03 DIAGNOSIS — I5032 Chronic diastolic (congestive) heart failure: Secondary | ICD-10-CM | POA: Diagnosis not present

## 2017-12-03 DIAGNOSIS — I2510 Atherosclerotic heart disease of native coronary artery without angina pectoris: Secondary | ICD-10-CM

## 2017-12-03 DIAGNOSIS — I481 Persistent atrial fibrillation: Secondary | ICD-10-CM

## 2017-12-03 DIAGNOSIS — I4819 Other persistent atrial fibrillation: Secondary | ICD-10-CM

## 2017-12-03 DIAGNOSIS — R42 Dizziness and giddiness: Secondary | ICD-10-CM

## 2017-12-03 DIAGNOSIS — I340 Nonrheumatic mitral (valve) insufficiency: Secondary | ICD-10-CM | POA: Diagnosis not present

## 2017-12-03 NOTE — Patient Instructions (Addendum)
Medication Instructions:  Your physician recommends that you continue on your current medications as directed. Please refer to the Current Medication list given to you today.  Labwork: NONE  Testing/Procedures: NONE  Follow-Up: Your physician wants you to follow-up in: 4 months with Dr. Acie Fredrickson.    If you need a refill on your cardiac medications before your next appointment, please call your pharmacy.

## 2017-12-03 NOTE — Progress Notes (Signed)
Cardiology Office Note    Date:  12/03/2017   ID:  [name], DOB [birth year], MRN [id number]  PCP:  Marletta Lor, MD  Cardiologist: Dr. Acie Fredrickson  Chief Complaint: 3 weeks follow up for CHF  History of Present Illness:   [name] is a 84 y.o. female with a hx of CAD s/p BMS to RCA x 2 in 2094, diastolic CHF,mitral regurgitation,HTN, HLD, persistent afib on Eliquis, chronic dyspnea on exertion, COPD, pulmonary HTN, prior TIA, prior DVT and h/ocolon CA presents for follow up.   January 2019 she had worsening dyspnea and volume overload. 2D echo was ordered 08/15/17 and showed normal LVEF at 55-60%. She was noted to have moderate MR. Images were reviewed by both Dr. Acie Fredrickson and Dr. Burt Knack and it was felt that her MR was not severe enough to warrant further evaluation for mitral valve clipping.   NST 08/2017 showed a small area of moderate ischemia in the distal anterior and anterolateral, septal and apical walls. [name] could not exclude some shifting breast attenuation. The study was felt to be intermediate risk, however medical therapy was elected as pt only with exertional symptoms with moderate to heavy activity.   Admitted 10/2017 for CAP and acute on chronic diastolic HF, also found to be anemic and + FOBT. Cardiology seen the patient for chest pain. EKGwithout acute ST-T wave changes, atrial fibrillation. Chest pain improved on Imdur '15mg'$  qd. She is not interested in pursuing further ischemic workup. Endoscopy showed superficial small gastric ulcers with no high risk stigmata of bleeding. Held Eliquis until follow up with PCP.   Imdur was discontinued due to dizziness which improved but never resolved. She was not orthostatic by vitals. Pharmacist felt that her could be due to gabapentin, Trazodone and sertraline (takes PRN).   By the time he went to see her PCP her dizziness was completely resolved.  No medication change was made.  Patient is feeling great.  Denies chest  pain, shortness of breath, orthopnea, PND, syncope, lower extremity edema, dizziness or blood in his stool or urine.  She continues to have intermittent brief episode of palpitation lasting for a few minutes.  Past Medical History:  Diagnosis Date  . Adenocarcinoma, colon [facility]) Oncologist-- Dr Truitt Merle   Multifocal (2) Colon cancer @ ileocecal valve and ascending ( [id number]), Stage IIIB, grade I, MMR normal , 2 of 16 +lymph nodes, negative surgical margins---  06-02-2014  Right hemicolectomy w/ colostomy  . Allergy   . Anxiety   . Asthma    inhaler "sometimes"  . CAD (coronary artery disease)    a. LHC 4/09: pLAD 40, mDx 70-75, pLCx 30, mLCx 50, mRCA 90, EF 60% >> PCI: BMS x2 to RCA;  b. Myoview 8/12: normal  . Cataract    bil cateracts removed  . Chronic anemia   . Chronic diastolic CHF (congestive heart failure) (HCC)    a. Echo 912 - Mild LVH, EF 55-60%, no RWMA, Gr 1 DD, mild MR, mild LAE, mild RAE, PASP 59 mmHg (mod to severe pulmo HTN)  . Clotting disorder (HCC)    hx of dvt, tia on eliquis  . Colostomy in place [facility])    s/p colostomy takedown 5/16  . Colovaginal fistula    s/p colostomy >> colostomy takedown 5/16  . COPD with chronic bronchitis ([place])   . Depression   . Dysrhythmia    A fib  . Essential hypertension   . GERD (gastroesophageal reflux disease)   .  History of adenomatous polyp of colon   . History of blood transfusion   . History of DVT of lower extremity   . History of hiatal hernia   . History of TIA (transient ischemic attack)    a. Head CT 6/15: small chronic lacunar infarct in thalamus  . Hyperlipidemia   . OA (osteoarthritis)   . Osteoporosis   . Peripheral neuropathy   . Pneumonia   . Pulmonary HTN ([place])    a. PASP on Echo in 9/12:  59 mmHg  . Renal insufficiency   . Sigmoid diverticulosis    s/p sigmoid colectomy  . Stroke ([place])    TIAs  . Wears dentures   . Wears glasses     Past Surgical History:  Procedure Laterality Date  . ABDOMINAL  HYSTERECTOMY    . ANTERIOR CERVICAL DECOMP/DISCECTOMY FUSION  01-31-2009   C5 -- 7  . APPENDECTOMY    . Bone spurs Bilateral    Feet  . CARDIOVASCULAR STRESS TEST  02-26-2011   Normal lexiscan no exercise study/  no ischemia/  normal LV function and wall motion, ef 67%  . CARPAL TUNNEL RELEASE Right   . CATARACT EXTRACTION W/ INTRAOCULAR LENS  IMPLANT, BILATERAL Bilateral   . CHOLECYSTECTOMY    . COLOSTOMY N/A 06/02/2014   Procedure: DIVERTING DESCENDING END COLOSTOMY;  Surgeon: Donnie Mesa, MD;  Location: [facility];  Service: General;  Laterality: N/A;  . CORONARY ANGIOPLASTY WITH STENT PLACEMENT  11-04-2007  dr bensimhon   BMS x2 to RCA/  mild Non-obstructive disease LAD/  normal LVF  . DILATION AND CURETTAGE OF UTERUS    . ESOPHAGOGASTRODUODENOSCOPY N/A 10/28/2017   Procedure: ESOPHAGOGASTRODUODENOSCOPY (EGD);  Surgeon: Yetta Flock, MD;  Location[facility];  Service: Gastroenterology;  Laterality: N/A;  . EVALUATION UNDER ANESTHESIA WITH ANAL FISTULECTOMY N/A 10/13/2014   Procedure: ANAL EXAM UNDER ANESTHESIA ;  Surgeon: [name], MD;  Location: [facility];  Service: General;  Laterality: N/A;  . HAND SURGERY     Tendon repair  . LAPAROSCOPIC SIGMOID COLECTOMY N/A 11/24/2014   Procedure: SIGMOID COLECTOMY AND COLSOTOMY CLOSURE;  Surgeon: Donnie Mesa, MD;  Location: [facility];  Service: General;  Laterality: N/A;  . LUMBAR LAMINECTOMY  10-29-2002,  1969   Left  L3 -- 4  decompression  . PARTIAL COLECTOMY N/A 06/02/2014   Procedure: RIGHT PARTIAL COLECTOMY ;  Surgeon: Donnie Mesa, MD;  Location: [facility];  Service: General;  Laterality: N/A;  . PATELLECTOMY Right 09/14/2013   Procedure: PATELLECTOMY;  Surgeon: Meredith Pel, MD;  Location: [facility];  Service: Orthopedics;  Laterality: Right;  . PROCTOSCOPY N/A 10/13/2014   Procedure: RIDGE PROCTOSCOPY;  Surgeon: [name], MD;  Location[facility];  Service: General;  Laterality: N/A;  . REVISION TOTAL KNEE ARTHROPLASTY  Bilateral right  04-02-2011/  left 1996 & 10-05-1999  . ROTATOR CUFF REPAIR Bilateral   . TONSILLECTOMY    . TOTAL KNEE ARTHROPLASTY Bilateral left 1994/  right 2000  . TOTAL KNEE REVISION Left 08/02/2015   Procedure: LEFT FEMORAL REVISION;  Surgeon: Gaynelle Arabian, MD;  Location: [facility];  Service: Orthopedics;  Laterality: Left;  . TRANSTHORACIC ECHOCARDIOGRAM  12-01-2009   Grade I diastolic dysfunction/  ef 60%/  moderate MR/  mild TR    Current Medications: Prior to Admission medications   Medication Sig Start Date End Date Taking? Authorizing Provider  acetaminophen (TYLENOL) 325 MG tablet Take 650 mg by mouth every 6 (six) hours as needed for moderate  pain.     [provider]  apixaban (ELIQUIS) 5 MG TABS tablet Take 5 mg by mouth 2 (two) times daily.    Marletta Lor, MD  Ascorbic Acid (VITAMIN C) 1000 MG tablet Take 1,000 mg by mouth daily.    [provider]  benazepril (LOTENSIN) 40 MG tablet Take 40 mg by mouth daily.    Marletta Lor, MD  diltiazem (CARDIZEM CD) 120 MG 24 hr capsule Take 1 capsule by mouth as needed when Heart Rate is above 662 and Systolic Blood Pressure is above 100 mm/hg 11/12/17   Ellieanna Funderburg, PA  esomeprazole (NEXIUM) 40 MG capsule Take 1 capsule (40 mg total) by mouth daily. 10/31/17   Patrecia Pour, MD  ferrous sulfate 325 (65 FE) MG EC tablet Take 325 mg by mouth daily with breakfast.    [provider]  furosemide (LASIX) 20 MG tablet Take 1 tablet (20 mg total) by mouth daily. 08/06/17 11/12/17  Lyda Jester M, PA-C  furosemide (LASIX) 20 MG tablet Take 20 mg by mouth daily.    Marletta Lor, MD  gabapentin (NEURONTIN) 600 MG tablet Take 600 mg by mouth 2 (two) times daily.    [provider]  HYDROcodone-acetaminophen (NORCO/VICODIN) 5-325 MG tablet Take 1-2 tablets by mouth every 6 (six) hours as needed for moderate pain. 08/04/17   Marletta Lor, MD  LORazepam (ATIVAN) 0.5 MG tablet  TAKE 1 TABLET BY MOUTH TWICE DAILY AS NEEDED FOR ANXIETY OR  SLEEP 11/12/17   Marletta Lor, MD  metoprolol tartrate (LOPRESSOR) 50 MG tablet Take 1.5 tablets (75 mg total) by mouth 2 (two) times daily. 11/12/17 02/10/18  Leanor Kail, PA  nitroGLYCERIN (NITROSTAT) 0.4 MG SL tablet Place 1 tablet (0.4 mg total) under the tongue every 5 (five) minutes as needed for chest pain (x 3 pills). Reported on 09/01/2015 10/23/16   Marletta Lor, MD  promethazine (PHENERGAN) 12.5 MG tablet Take 12.5 mg by mouth as directed.    [provider]  sertraline (ZOLOFT) 50 MG tablet Take 1 tablet (50 mg total) by mouth daily. 10/23/16   Marletta Lor, MD  traZODone (DESYREL) 100 MG tablet Take 0.5 tablets (50 mg total) by mouth at bedtime. 10/23/16   Marletta Lor, MD  triamcinolone cream (KENALOG) 0.1 % Apply 1 application topically 2 (two) times daily as needed (Eczema on legs). 10/23/16   Marletta Lor, MD    Allergies:   Patient has no known allergies.   Social History   Socioeconomic History  . Marital status: Widowed    Spouse name: Not on file  . Number of children: Not on file  . Years of education: Not on file  . Highest education level: Not on file  Occupational History  . Not on file  Social Needs  . Financial resource strain: Not on file  . Food insecurity:    Worry: Not on file    Inability: Not on file  . Transportation needs:    Medical: Not on file    Non-medical: Not on file  Tobacco Use  . Smoking status: Former Smoker    Packs/day: 0.50    Years: 15.00    Pack years: 7.50    Types: Cigarettes    Last attempt to quit: 07/16/1967    Years since quitting: 50.4  . Smokeless tobacco: Never Used  Substance and Sexual Activity  . Alcohol use: No    Alcohol/week: 0.0 oz  .  Drug use: No  . Sexual activity: Not on file  Lifestyle  . Physical activity:    Days per week: Not on file    Minutes per session: Not on file  . Stress: Not on file    Relationships  . Social connections:    Talks on phone: Not on file    Gets together: Not on file    Attends religious service: Not on file    Active member of club or organization: Not on file    Attends meetings of clubs or organizations: Not on file    Relationship status: Not on file  Other Topics Concern  . Not on file  Social History Narrative   Married, 2 children.  As of November 2015 her husband has been living in a nursing home for tendon after years as he suffers from multiple medical problems.   Patient does not drink alcohol nor does she smoke or chew tobacco products   Right handed   8th grade   1 cup daily     Family History:  The patient's family history includes Cancer (age of onset: 24) in her sister; Colon cancer (age of onset: 56) in her maternal uncle; Heart Problems in her father; Heart failure in her mother; Kidney cancer (age of onset: 41) in her brother; Osteoarthritis in her mother.   ROS:   Please see the history of present illness.    ROS All other systems reviewed and are negative.   PHYSICAL EXAM:   VS:  BP 100/66   Pulse 83   Ht '5\' 4"'$  (1.626 m)   Wt 186 lb 12.8 oz (84.7 kg)   BMI 32.06 kg/m    GEN: elderly female  in no acute distress  HEENT: normal  Neck: no JVD, carotid bruits, or masses Cardiac: IR Ir, +  murmurs, rubs, or gallops,no edema  Respiratory:  clear to auscultation bilaterally, normal work of breathing GI: soft, nontender, nondistended, + BS MS: no deformity or atrophy  Skin: warm and dry, no rash Neuro:  Alert and Oriented x 3, Strength and sensation are intact Psych: euthymic mood, full affect  Wt Readings from Last 3 Encounters:  12/03/17 186 lb 12.8 oz (84.7 kg)  11/27/17 182 lb (82.6 kg)  11/17/17 185 lb (83.9 kg)      Studies/Labs Reviewed:   EKG:  EKG is ordered today.  The ekg ordered today demonstrates afib at rate of 83 bpm  Recent Labs: 08/04/2017: Pro B Natriuretic peptide (BNP) 366.0 10/26/2017: B  Natriuretic Peptide 478.6 10/28/2017: ALT 22 10/30/2017: Magnesium 2.1 11/07/2017: BUN 25; Creatinine, Ser 1.34; Hemoglobin 12.2; Platelets 146.0; Potassium 4.5; Sodium 142   Lipid Panel    Component Value Date/Time   CHOL 130 01/19/2013 1014   TRIG 87.0 01/19/2013 1014   HDL 33.60 (L) 01/19/2013 1014   CHOLHDL 4 01/19/2013 1014   VLDL 17.4 01/19/2013 1014   LDLCALC 79 01/19/2013 1014    Additional studies/ records that were reviewed today include:   Echocardiogram 10/28/17: Study Conclusions  - Left ventricle: The cavity size was normal. There was severe focal basal and moderate concentric hypertrophy. Systolic function was normal. The estimated ejection fraction was in the range of 55% to 60%. Wall motion was normal; there were no regional wall motion abnormalities. The study was not technically sufficient to allow evaluation of LV diastolic dysfunction due to atrial fibrillation. - Aortic valve: Mildly to moderately calcified annulus. Trileaflet; normal thickness, mildly calcified leaflets. - Mitral valve: There  was moderate regurgitation. - Left atrium: The atrium was severely dilated. - Tricuspid valve: There was moderate regurgitation. - Pulmonic valve: There was trivial regurgitation. - Pulmonary arteries: PA peak pressure: 50 mm Hg (S).  Impressions:  - The right ventricular systolic pressure was increased consistent with moderate pulmonary hypertension.   2D Echo 08/15/17 Study Conclusions  - Left ventricle: The cavity size was normal. There was mild focal basal hypertrophy of the septum. Systolic function was normal. The estimated ejection fraction was in the range of 55% to 60%. Wall motion was normal; there were no regional wall motion abnormalities. The study is not technically sufficient to allow evaluation of LV diastolic function. - Aortic valve: Trileaflet; mildly thickened, mildly calcified leaflets. - Mitral valve:  There was moderate regurgitation. - Left atrium: The atrium was severely dilated. - Right atrium: The atrium was severely dilated. - Tricuspid valve: There was moderate regurgitation. - Pulmonary arteries: Systolic pressure was moderately increased. PA peak pressure: 54 mm Hg (S).  NST 08/15/17 Study Highlights    Nuclear stress EF: 52%.  Small region of moderate ischemia in the distal anterior, anterolateral, septal and apical walls. Cannot exclude some shifting breast attenuation.  There was no ST segment deviation noted during stress.  This is an intermediate risk study      ASSESSMENT & PLAN:    1. CAD status post bare-metal stent to RCA x2 in 2009 -Recent stress test intermediate risk.  She did not tolerated Imdur.  No recurrent chest pain concerning for angina.  She is not interested in pursuing further ischemic work-up.  Not on aspirin due to need of anticoagulation.  Continue beta-blocker.   2.  Persistent atrial fibrillation -Palpitation has improved significantly.  Just a brief episodes.  Continue metoprolol 75 mg twice daily.  Continue Eliquis for anticoagulation.  3.  Dizziness -Resolved with discontinuation of Imdur.  Patient has a soft low blood pressure but not symptomatic.  Will continue metoprolol and benazepril at current dose. Advised to cut back on benazepril if low BP.   4.  Chronic diastolic heart failure -No orthopnea or PND.  Continue Lasix 20 mg daily.  Euvolemic by exam.   5. Mitral Regurgitation: -Moderate by recent echo. Not severe enough to be considered for MV clip at that time.    Medication Adjustments/Labs and Tests Ordered: Current medicines are reviewed at length with the patient today.  Concerns regarding medicines are outlined above.  Medication changes, Labs and Tests ordered today are listed in the Patient Instructions below. Patient Instructions  Medication Instructions:  Your physician recommends that you continue on your  current medications as directed. Please refer to the Current Medication list given to you today.  Labwork: NONE  Testing/Procedures: NONE  Follow-Up: Your physician wants you to follow-up in: 4 months with Dr. Acie Fredrickson.    If you need a refill on your cardiac medications before your next appointment, please call your pharmacy.       Jarrett Soho, [place]  12/03/2017 11:30 AM    [address], [place], [place]  [address] Phone: [phone]; Fax: [phone]

## 2017-12-04 ENCOUNTER — Encounter: Payer: Self-pay | Admitting: *Deleted

## 2017-12-04 ENCOUNTER — Other Ambulatory Visit: Payer: Self-pay | Admitting: *Deleted

## 2017-12-04 DIAGNOSIS — F329 Major depressive disorder, single episode, unspecified: Secondary | ICD-10-CM | POA: Diagnosis not present

## 2017-12-04 DIAGNOSIS — I482 Chronic atrial fibrillation: Secondary | ICD-10-CM | POA: Diagnosis not present

## 2017-12-04 DIAGNOSIS — I110 Hypertensive heart disease with heart failure: Secondary | ICD-10-CM | POA: Diagnosis not present

## 2017-12-04 NOTE — Patient Outreach (Signed)
[facility]) [place] Telephone Outreach PCP completes transition of care outreach post-hospital discharge Post-hospital discharge day 34  12/04/2017  [name] February 16, 1930 [id number]  09:30 am:  Unsuccessful telephone outreach to [name], daughter/ caregiver, on [facility] CM written consent, for [name], 84 y.o. female referred to [facility] after recent hospitalization April 14-19, 2019 for fatigue, N/V, Acute on chronic CHF, and possible CAP.Patient was discharged home with home health services for nursing only, as she refused SNF placement for short-term rehabilitation and home health PT services. Patient has history including, but not limited to, CAD; dCHF; A-Fib, on ACT; GERD; and HTN/ HLD.   HIPAA compliant voice mail message left for patient's caregiver, requesting return call back.  1:55 pm: re-attempted telephone outreach to patient's caregiver and successfully connected; HIPAA/ identity verified with patient's daughter/ caregiver.  This afternoon, [name] reports that patient "is doing absolutely great-- she has even gone home to her own home for now," stating that at any point, patient knows she can return to daughter's home.  Caregiver denies that patient is in pain, has had new/ recent falls, or is any distress.  States that patient went for her "normal hair appointment this morning."  Caregiver further reports:  -- attended cardiology office visit yesterday, was given "a great report," and doesn't have to return for another 3 months, unless problems arise -- no changes to medications: reports that cardiologist instructed patient to check her BP daily prior to taking BP medications, and to hold or reduce dosing by half if her BP is running lower than normal, or she is having any dizziness -- denies concerns/ problems around medications-- patient continues managing medications independently -- caregiver continues going with  patient and providing transportation to provider appointments -- denies that patient has had any dizziness; reports occasional isolated low BP's without symptoms -- home health services continue; patient actively participating; expects nurse to visit patient this afternoon and states patient "Lebron be discharged" from home health "soon." -- has continued monitoring and recording daily weights, which are regularly reviewed by caregiver; caregiver reports all weights ranging from 181- 184 lbs consistently -- caregiver verbalizes excellent understanding of CHF zones and corresponding action plans, and reports patient consistently in "green zone" "over last few weeks" -- previously reported "eye infection all healed up now"  Patient's caregiver denies further issues, concerns, or problems today.  I confirmed that patient/ caregiver have my direct phone number, the main [facility] CM office phone number, and the [facility] CM 24-hour nurse advice phone number should issues arise prior to next scheduled [place] outreach, which we agreed would be in 2 weeks.  Encouraged patient to contact me directly if needs, questions, issues, or concerns arise prior to next scheduled outreach; caregiver agreed to do so.  Plan:  Patient will take medications and attend all scheduled provider appointments post- hospital discharge  Patient will actively participate with home health nursing services as ordered post-hospital discharge  Patient willcontinuedaily weightand BPmonitoring and recording  Patient will review educational material provided to her today around CHF zones/ action plans, with focus on yellow zone, as indicated  Patient/ caregiver will promptly notify care providers for any new concerns/ issues/ or problems  [facility] CM outreach to continue withscheduled phone call to caregiver in 2 weeks  [facility] CM Care Plan Problem One     Most Recent Value  Care Plan Problem One  Risk for hospital  readmission related to/  as evidenced by recent hospitalization April 14-19, 2019 for CHF exacerbation   Role Documenting the Problem One  Care Management Coordinator  Care Plan for Problem One  Not Active  [facility] Long Term Goal   Over the next 31 days, patient will not experience hospital readmission, as evidenced by patient/ caregiver reporting and review of EMR during [place] outreach  [facility] Long Term Goal Start Date  11/03/17  [facility] Long Term Goal Met Date  12/04/17 - Goal Met  Interventions for Problem One Long Term Goal  Discussed patient's current clinical status, recent BP's, recent provider appointments, and daily weight ranges with patient's caregiver  [facility] CM Short Term Goal #1   Over the next 30 days, patient will actively participate in home health services for nursing, as evidenced by patient/ caregiver reporting and collaboration with home health team, as indicated  [facility] CM Short Term Goal #1 Start Date  11/03/17  [facility] CM Short Term Goal #1 Met Date  12/04/17- Goal Met  Interventions for Short Term Goal #1  Confirmed with caregiver/ daughter that home health services continue for now and that patient is actively participating in all disciplines    [facility] CM Care Plan Problem Two     Most Recent Value  Care Plan Problem Two  Self-health management deficits for chronic disease state of CHF, as evidenced by recent admission for CHF exacerbation  Role Documenting the Problem Two  Care Management Coordinator  Care Plan for Problem Two  Active  Interventions for Problem Two Long Term Goal   Confirmed with patient's caregiver/ daughter that patient has no concerns around her breathing and is in "green" zone,  confirmed that patient continues monitoring and recording daily weights,  discussed and reviewed patient's recently recorded daily weights  [facility] Long Term Goal  Over the next 68 days, patient will be able to verbalize signs/ symptoms of yellow CHF zone along with corresponding action plan, as  ecidenced by patient/ caregiver reporting during [facility] RN CCm outreach  [facility] Long Term Goal Start Date  11/27/17  [facility] CM Short Term Goal #2   Over the next 30 days, patient will monitor and record daily weights, as evidenced by patient/ caregiver reporting and review of same during [facility] RN CCM outreach  [facility] CM Short Term Goal #2 Start Date  11/03/17  [facility] CM Short Term Goal #2 Met Date  12/04/17 - Goal Met  Interventions for Short Term Goal #2  Confirmed with patient's caregiver and daughter that patient has consistently resumed daily weight monitoring and recording- and that caregiver and patient understands weight fgain guidelines in setting of CHF     Whitney Rack, RN, BSN, [employer] Coordinator [facility] Care Management  [phone]

## 2017-12-10 ENCOUNTER — Telehealth: Payer: Self-pay | Admitting: Internal Medicine

## 2017-12-10 NOTE — Telephone Encounter (Signed)
After reviewing the chart it looks like pt lasix changed from 3x a week to daily last fall. However, im not sure if this was suppose to be for a short period of time. Please advise if lasix is to be taking daily or 3x a week.

## 2017-12-10 NOTE — Telephone Encounter (Signed)
Continue furosemide 20 mg daily until office reevaluation

## 2017-12-10 NOTE — Telephone Encounter (Signed)
Copied from [place] [phone]. Topic: Quick Communication - See Telephone Encounter >> Stark[place] 29, 2019  9:47 AM Conception Chancy, NT wrote: CRM for notification. See Telephone encounter for: 12/10/17.  Jenny Reichmann is calling with [facility] home health and states that the patients weight gain today was 3.4 pounds. He states the patient is doing well today with no changes and no swelling. Jenny Reichmann is needing clarification on how the patient is supposed to be taking Lasix. He states the patients bottle says M W F 20mg  and the patient states she is taking 20mg  of Lasix daily please advise.   [phone] ext [phone]

## 2017-12-10 NOTE — Telephone Encounter (Signed)
Left message with office staff and she stated she would relay the message.

## 2017-12-12 ENCOUNTER — Encounter

## 2017-12-12 ENCOUNTER — Ambulatory Visit: Payer: PPO | Admitting: Cardiovascular Disease

## 2017-12-16 ENCOUNTER — Other Ambulatory Visit: Payer: Self-pay | Admitting: Internal Medicine

## 2017-12-17 ENCOUNTER — Other Ambulatory Visit: Payer: Self-pay | Admitting: *Deleted

## 2017-12-17 ENCOUNTER — Encounter: Payer: Self-pay | Admitting: *Deleted

## 2017-12-17 NOTE — Patient Outreach (Signed)
[facility]) Care Management [place] Telephone Outreach Post-hospital discharge day 47  12/17/2017  [name] 09/04/1929 [id number]  Successful telephone outreach to [name], daughter/ caregiver, on [facility] CM written consent, for [name], 84 y.o.femalereferred to [facility] after recent hospitalization April 14-19, 2019 for fatigue, N/V, Acute on chronic CHF, and possible CAP.Patient was discharged home with home health services for nursing only, as she refused SNF placement for short-term rehabilitation and home health PT services. Patient has history including, but not limited to, CAD; dCHF; A-Fib, on ACT; GERD; and HTN/ HLD. HIPAA/ identity verified with patient's daughter/ caregiver.  Today, [name] reports that patient "is doing great" and she confirms that patient has continued staying at her own home where she lives alone.  Reports that patient has "been getting out and about with friends and was "even able to go to yard sales last weekend.  Caregiver denies that patient is in pain, has had new/ recent falls, or is any distress.   Caregiver further reports:  -- no recent provider appointments; upcoming appointments reviewed with daughter-- who verbalizes plans to continue transporting/ attending with patient -- no concerns around medications; patient continues self- managing medications independently -- denies that patient has had any concerning signs/ symptoms/ episodes:  denies dizziness; reports one isolated low BP reading without symptoms-- discussed with caregiver possibility of occasional inaccurate readings obtained from automatic BP cuff; discussed possibility that if patient gets low BP reading without experiencing associated symptoms, to re-position cuff and re-check after a few minutes; reiterated concerning signs/ symptoms that would be associated with low BP's and necessitate action-- caregiver verbalizes understanding -- home health  services now completed -- has continued monitoring and recording daily weights, which are regularly reviewed by caregiver; caregiver reports all daily weights continue consistently ranging from 182- 184 lbs; caregiver continues reviewing daily weights with patient on a regular basis  -- caregiver continues to verbalizes excellent understanding of CHF zones and corresponding action plans, and reports patient has consistently remained in "green zone"   Patient's caregiver denies further issues, concerns, or problems today. I confirmed that patient/ caregiver havemy direct phone number, the main [facility] CM office phone number, and the [facility] CM 24-hour nurse advice phone number should issues arise prior to next scheduled [place] outreach, which we agreed would be in 3 weeks.  Encouraged patient/ caregiver to contact me directly if needs, questions, issues, or concerns arise prior to next scheduled outreach; caregiver agreed to do so.  Plan:  Patient will take medications and attend all scheduled provider appointments post- hospital discharge  Patient willcontinuedaily weightand BPmonitoring and recording  Patient will review educational material previously provided to her around CHF zones/ action plans, with focus on yellow zone, as indicated  Patient/ caregiver will promptly notify care providers for any new concerns/ issues/ or problems  [facility] CM outreach to continue withscheduledphone call to caregiver in 3 weeks  [facility] CM Care Plan Problem Two     Most Recent Value  Care Plan Problem Two  Self-health management deficits for chronic disease state of CHF, as evidenced by recent admission for CHF exacerbation  Role Documenting the Problem Two  Care Management Coordinator  Care Plan for Problem Two  Active  Interventions for Problem Two Long Term Goal   Discussed current clinical status with patient's caregiver,  confirmed that patient has not experienced signs/ symptoms  concerning for CHF exacerbation and is continuing to monitor and record  daily weights,  reiterated signs/ symptoms yellow zone with corresponding action plan with patient's caregiver  [facility] Long Term Goal  Over the next 68 days, patient will be able to verbalize signs/ symptoms of yellow CHF zone along with corresponding action plan, as ecidenced by patient/ caregiver reporting during [facility] RN CCm outreach  [place] Term Goal Start Date  11/27/17     Oneta Rack, RN, BSN, [place] Coordinator [facility] Care Management  [phone]

## 2017-12-23 ENCOUNTER — Other Ambulatory Visit: Payer: Self-pay | Admitting: Internal Medicine

## 2018-01-06 ENCOUNTER — Other Ambulatory Visit: Payer: Self-pay | Admitting: *Deleted

## 2018-01-06 ENCOUNTER — Encounter: Payer: Self-pay | Admitting: *Deleted

## 2018-01-06 NOTE — Patient Outreach (Signed)
[facility]) Care Management [place] Telephone Outreach Post-hospital discharge day 67  01/06/2018  [name] May 01, 1930 [id number]  09:10 am:  Unsuccessful telephone outreach to [name], daughter/ caregiver, on [facility] CM written consent, forLestie C Gear,84 y.o.femalereferred to [place] after recent hospitalization April 14-19, 2019 for fatigue, N/V, Acute on chronic CHF, and possible CAP.Patient was discharged home with home health services for nursing only, as she refused SNF placement for short-term rehabilitation and home health PT services. Patient has history including, but not limited to, CAD; dCHF; A-Fib, on ACT; GERD; and HTN/ HLD.   3:00 pm:  Call from earlier today re-attempted; successful telephone outreach; HIPAA/ identity verifiedwith patient's daughter/ caregiver.  Today, [name] reports that patient continues "doing excellent" and she again confirms that patient has continued staying at her own home where she lives alone.  Reports that patient has continued getting out and about with friends, doing some "light gardening" and she denies that patient is in pain, has had new/ recent falls, or is any distress.   Caregiver further reports:  -- no recent provider appointments; upcoming appointments reviewed with daughter-- who verbalizes plans to continue transporting/ attending with patient -- no concerns around medications; patient continues self- managing medications independently -- denies that patient has had any concerning signs/ symptoms/ episodes:  denies dizziness; reports that home monitored BP and weight "monitor that goes to her doctor" has been returned, and that patient has continued independently monitoring these values; caregiver reports that she routinely double-checks behind patient to ensure that her "numbers look good."  Denies recent concerns with any daily BP or weight values -- caregiver continues to verbalizes  excellent understanding of CHF zones and corresponding action plans, along with weight gain guidelines in setting of CHF, and also reports patient has consistently remained in "green zone" since last [place] outreach 3 weeks ago.  Patient's caregiverdenies further issues, concerns, or problems today. I confirmed that patient/ caregiverhavemy direct phone number, the main [facility] CM office phone number, and the [facility] CM 24-hour nurse advice phone number should issues arise prior to next scheduled [place] outreach, which we agreed would be next month.  Discussed with caregiver that patient has thus far met her previously established [facility] CM goals, and possibility of case closure was discussed with caregiver-- which caregiver agrees with. Encouraged patient/ caregiver to contact me directly if needs, questions, issues, or concerns arise prior to next scheduled outreach; caregiveragreed to do so.  Plan:  Patient will take medications and attend all scheduled provider appointments post- hospital discharge  Patient willcontinuedaily weightand BPmonitoring and recording  Patient will review educational material previously provided to her around CHF zones/ action plans, with focus on yellow zone, as indicated  Patient/ caregiver will promptly notify care providers for any new concerns/ issues/ or problems  [facility] CM outreach to continue withscheduledphone call to caregiver next month, possibly for [facility] CM caseclosure  [facility] CM Care Plan Problem Two     Most Recent Value  Care Plan Problem Two  Self-health management deficits for chronic disease state of CHF, as evidenced by recent admission for CHF exacerbation  Role Documenting the Problem Two  Care Management Coordinator  Care Plan for Problem Two  Active  Interventions for Problem Two Long Term Goal   Discussed with patient's caregiver patient's current clinical status,  reviewed and reiterated  previously provided education around CHF self-health management and confirmed that caregiver is able  to verbalize weight gain guidelines in setting of CHF, signs/ symptoms concerning for CHF yellow zone, action plan for same  [facility] Long Term Goal  Over the next 68 days, patient will be able to verbalize signs/ symptoms of yellow CHF zone along with corresponding action plan, as ecidenced by patient/ caregiver reporting during [facility] RN CCm outreach  [place] Term Goal Start Date  11/27/17     Oneta Rack, RN, BSN, [place] Coordinator [facility] Care Management  [phone]

## 2018-01-12 ENCOUNTER — Other Ambulatory Visit: Payer: Self-pay | Admitting: Internal Medicine

## 2018-01-14 ENCOUNTER — Telehealth: Payer: Self-pay | Admitting: Internal Medicine

## 2018-01-14 NOTE — Progress Notes (Signed)
[place]  [phone] [phone]  Clinic Follow Up Note   Patient Care Team: Marletta Lor, MD as PCP - General (Internal Medicine) [name], Wonda Cheng, MD as PCP - Cardiology (Cardiology) Knox Royalty, RN as [facility] Management 01/19/2018  CHIEF COMPLAINTS:  Follow up multifocal colon cancer  Oncology History   Cancer Staging Cancer of ascending colon [facility]) Staging form: Colon and Rectum, AJCC 7th Edition - Clinical: Stage IIIB (T4a, N1, M0) - Unsigned       Cancer of ascending colon ([place])   06/02/2014 Initial Diagnosis    Adenocarcinoma, colon. she presented with anemia, nauseam anorexia and abdominal pain and [facility].      06/02/2014 Surgery    exploratory laparotomy, right hemicolectomy, and descending colostomy by Dr. Georgette Dover      06/02/2014 Pathologic Stage    multifocal (2) colon cancer at  ileocecal valve and ascending colon.,  mT4N1Mx, at least stage IIIB, grade 1, MMR normal, surgical margins negative.       06/02/2014 Tumor Marker    CEA 1.7      11/24/2014 Surgery    SIGMOID COLECTOMY AND COLSOTOMY CLOSURE with Donnie Mesa      03/06/2016 Imaging    CT chest, abdomen and pelvis without contrast showed stable remote surgical changes, no evidence of metastatic disease. Stable atherosclerotic calcifications.      09/12/2016 Imaging    CT CAP W/ CONTRAST 09/12/2016 IMPRESSION: 1. Status post partial right hemicolectomy, without evidence to suggest local recurrence of disease or metastatic disease to the chest, abdomen or pelvis. 2. Cardiomegaly with biatrial dilatation. 3. Aortic atherosclerosis, in addition to left main and 3 vessel coronary artery disease. 4. Moderate to large hiatal hernia.      12/25/2016 Procedure    Colonoscopy Impression: - Two 3 to 5 mm polyps in the sigmoid colon and in the transverse colon, removed with a cold snare. Resceted and  retrieved - Internal hemorrhoids - [place] surgeries as described. The examination was otherwise normal on direct and retroflexion views      07/22/2017 Imaging    CT CAP 07/22/17 IMPRESSION: 1. No evidence of local tumor recurrence at ileocolic anastomosis in the right abdomen. 2. No evidence of metastatic disease in the abdomen or pelvis. 3. New nonspecific patchy ground-glass nodularity in the right greater than left lungs. The ground-glass density is more suggestive of an inflammatory etiology. Recommend attention on follow-up chest CT in 3 months. 4. New nonspecific mild mediastinal lymphadenopathy. Given the above findings in the lungs, these nodes Fraiser be reactive, although short-term chest CT follow-up is again recommended in 3 months. 5. Liver surface appears finely irregular, question hepatic cirrhosis. Consider hepatic elastography for further liver fibrosis risk stratification, as clinically warranted. 6. Chronic findings include: Aortic Atherosclerosis (ICD10-I70.0). Left main and 3 vessel coronary atherosclerosis. Moderate hiatal hernia. Chronic mosaic attenuation in the lungs, most commonly due to air trapping from small airways disease.      10/26/2017 Imaging    CT-angio Chest 10/26/2017 IMPRESSION: 1. Negative for acute pulmonary embolus. 2. Right greater than left ground-glass density with increased nodularity in the right upper, middle and lower lobes, suspicious for pulmonary infection, possible atypical pneumonia. Short interval CT follow-up recommended following antibiotic trial and ensure clearing. Persistent mediastinal adenopathy, possibly reactive and not significantly changed since January 2019      10/27/2017 Imaging    CT AP 10/27/2017 IMPRESSION: 1. Mild hazy right  basilar airspace opacity Lawn reflect mild asymmetric interstitial edema or possibly pneumonia. 2. Moderate hiatal hernia. 3. Scattered coronary artery calcifications. 4. Small left renal cyst.        10/28/2017 Pathology Results    10/28/2017 Surgical pathology Diagnosis Stomach, biopsy - ANTRAL AND OXYNTIC MUCOSA WITH SLIGHT CHRONIC INFLAMMATION. - WARTHIN-STARRY STAIN NEGATIVE FOR HELICOBACTER PYLORI. - NO INTESTINAL METAPLASIA, DYSPLASIA, OR MALIGNANCY.        HISTORY OF PRESENTING ILLNESS:  [name] 84 y.o. female is here because of recently diagnosed and resected multifocal colon cancer. I saw him when she was admitted to the hospital, and she is here with her family members for follow-up.  She has a history of chronic anemia and colovaginal fistula as well as diverticulosis with colonic polyps since 2008, was admitted to [facility] on 05/31/2014 With progressive abdominal pain, worse over the last couple of weeks, accompanied by nausea and vomiting, without weight loss. Workup included a CT of the abdomen and pelvis with contrast, which revealed acute small bowel obstruction due to what appears to be a soft tissue mass (apple core lesion) at the ileocecal valve suspicious for intestinal adenocarcinoma.This mass measures 6 x 4 cm. Up to 8 mm stable pericecal lymph nodes were visualized. No liver or distant metastasis identified. Small volume free fluid in the abdomen and pelvis, Likely reactive, was seen. No free air.  She underwent exploratory laparotomy, right hemicolectomy, and descending colostomy by Dr. Georgette Dover on 06/02/2014. Grossly, there are two tumors identified. The larger tumor is located at the ileocecal valve and consists of an 8.5 cm span of moderately differentiated adenocarcinoma that involves the serosa. This tumor has associated perineural and lymphovascular invasion. The second discontiguous tumor is present in the proximal right colon and spans 4.0 cm and consists of well differentiated adenocarcinoma that is morphologically dissimilar from the larger tumor. This tumor extends into the muscularis propria.Her CEA is 1.7. It is stage mT4, N1b.  immunohistochemical stains are normal, with very low probability of microsatellite instability (See details below ).  She was discharged to rehabilitation after her surgery. She has been doing well at to rehabilitation. She had a surgical wound infection a few weeks after surgery, which was treated with oral antibiotics and wound care. She was not very physically active before the surgery due to her multiple arthritis, she is recovering well at the nursing home and is likely going home in a few days. She is able to move around with a walker, has good appetite, surgical site pain is controlled, no nausea or other complaints. She did developed some UTI symptoms, and started Cipro and probiotics yesterday at the nursing home.  CURRENT THERAPY: Surveillance  INTERIM HISTORY [name] returns for follow up, she is accompanied by family.  She feels like there is sometime stuck in her throat and voice hoarseness. She has dysphagia to both fluids and liquids. She states that she had an endoscopy done, which revealed hiatal hernia and bleeding ulcers. She had melena at that time. She also has been having diarrhea constantly.  She cannot tell if it's become worse after colonectomy. She follows up with her cardiologist and PCP regularly. She reports having CAD, CHF, and recent pneumonia. She has a cough that has increases during the last month, it is constant waxing and waning. She reports left lower extremity lateral erythema and edema. The area burns and stings, no itching. She tried an ointment that didn't help much. She states that she  has mild constant abdominal pain. She is compliant with her medications.      MEDICAL HISTORY:  Past Medical History:  Diagnosis Date  . Adenocarcinoma, colon [facility]) Oncologist-- Dr Truitt Merle   Multifocal (2) Colon cancer @ ileocecal valve and ascending ( mT4N1Mx), Stage IIIB, grade I, MMR normal , 2 of 16 +lymph nodes, negative surgical margins---  06-02-2014  Right  hemicolectomy w/ colostomy  . Allergy   . Anxiety   . Asthma    inhaler "sometimes"  . CAD (coronary artery disease)    a. LHC 4/09: pLAD 40, mDx 70-75, pLCx 30, mLCx 50, mRCA 90, EF 60% >> PCI: BMS x2 to RCA;  b. Myoview 8/12: normal  . Cataract    bil cateracts removed  . Chronic anemia   . Chronic diastolic CHF (congestive heart failure) (HCC)    a. Echo 912 - Mild LVH, EF 55-60%, no RWMA, Gr 1 DD, mild MR, mild LAE, mild RAE, PASP 59 mmHg (mod to severe pulmo HTN)  . Clotting disorder (HCC)    hx of dvt, tia on eliquis  . Colostomy in place [facility])    s/p colostomy takedown 5/16  . Colovaginal fistula    s/p colostomy >> colostomy takedown 5/16  . COPD with chronic bronchitis ([place])   . Depression   . Dysrhythmia    A fib  . Essential hypertension   . GERD (gastroesophageal reflux disease)   . History of adenomatous polyp of colon   . History of blood transfusion   . History of DVT of lower extremity   . History of hiatal hernia   . History of TIA (transient ischemic attack)    a. Head CT 6/15: small chronic lacunar infarct in thalamus  . Hyperlipidemia   . OA (osteoarthritis)   . Osteoporosis   . Peripheral neuropathy   . Pneumonia   . Pulmonary HTN ([place])    a. PASP on Echo in 9/12:  59 mmHg  . Renal insufficiency   . Sigmoid diverticulosis    s/p sigmoid colectomy  . Stroke ([place])    TIAs  . Wears dentures   . Wears glasses     SURGICAL HISTORY: Past Surgical History:  Procedure Laterality Date  . ABDOMINAL HYSTERECTOMY    . ANTERIOR CERVICAL DECOMP/DISCECTOMY FUSION  01-31-2009   C5 -- 7  . APPENDECTOMY    . Bone spurs Bilateral    Feet  . CARDIOVASCULAR STRESS TEST  02-26-2011   Normal lexiscan no exercise study/  no ischemia/  normal LV function and wall motion, ef 67%  . CARPAL TUNNEL RELEASE Right   . CATARACT EXTRACTION W/ INTRAOCULAR LENS  IMPLANT, BILATERAL Bilateral   . CHOLECYSTECTOMY    . COLOSTOMY N/A 06/02/2014   Procedure: DIVERTING  DESCENDING END COLOSTOMY;  Surgeon: Donnie Mesa, MD;  Location: [place];  Service: General;  Laterality: N/A;  . CORONARY ANGIOPLASTY WITH STENT PLACEMENT  11-04-2007  dr bensimhon   BMS x2 to RCA/  mild Non-obstructive disease LAD/  normal LVF  . DILATION AND CURETTAGE OF UTERUS    . ESOPHAGOGASTRODUODENOSCOPY N/A 10/28/2017   Procedure: ESOPHAGOGASTRODUODENOSCOPY (EGD);  Surgeon: Yetta Flock, MD;  Location[facility] ENDOSCOPY;  Service: Gastroenterology;  Laterality: N/A;  . EVALUATION UNDER ANESTHESIA WITH ANAL FISTULECTOMY N/A 10/13/2014   Procedure: ANAL EXAM UNDER ANESTHESIA ;  Surgeon: [name], MD;  Location: [facility];  Service: General;  Laterality: N/A;  . HAND SURGERY     Tendon repair  .  LAPAROSCOPIC SIGMOID COLECTOMY N/A 11/24/2014   Procedure: SIGMOID COLECTOMY AND COLSOTOMY CLOSURE;  Surgeon: Donnie Mesa, MD;  Location: [place];  Service: General;  Laterality: N/A;  . LUMBAR LAMINECTOMY  10-29-2002,  1969   Left  L3 -- 4  decompression  . PARTIAL COLECTOMY N/A 06/02/2014   Procedure: RIGHT PARTIAL COLECTOMY ;  Surgeon: Donnie Mesa, MD;  Location: [place];  Service: General;  Laterality: N/A;  . PATELLECTOMY Right 09/14/2013   Procedure: PATELLECTOMY;  Surgeon: Meredith Pel, MD;  Location: [place];  Service: Orthopedics;  Laterality: Right;  . PROCTOSCOPY N/A 10/13/2014   Procedure: RIDGE PROCTOSCOPY;  Surgeon: [name], MD;  Location: [facility];  Service: General;  Laterality: N/A;  . REVISION TOTAL KNEE ARTHROPLASTY Bilateral right  04-02-2011/  left 1996 & 10-05-1999  . ROTATOR CUFF REPAIR Bilateral   . TONSILLECTOMY    . TOTAL KNEE ARTHROPLASTY Bilateral left 1994/  right 2000  . TOTAL KNEE REVISION Left 08/02/2015   Procedure: LEFT FEMORAL REVISION;  Surgeon: Gaynelle Arabian, MD;  Location: [facility];  Service: Orthopedics;  Laterality: Left;  . TRANSTHORACIC ECHOCARDIOGRAM  12-01-2009   Grade I diastolic dysfunction/  ef 60%/  moderate MR/  mild TR    SOCIAL  HISTORY: Social History   Socioeconomic History  . Marital status: Widowed    Spouse name: Not on file  . Number of children: Not on file  . Years of education: Not on file  . Highest education level: Not on file  Occupational History  . Not on file  Social Needs  . Financial resource strain: Not on file  . Food insecurity:    Worry: Not on file    Inability: Not on file  . Transportation needs:    Medical: Not on file    Non-medical: Not on file  Tobacco Use  . Smoking status: Former Smoker    Packs/day: 0.50    Years: 15.00    Pack years: 7.50    Types: Cigarettes    Last attempt to quit: 07/16/1967    Years since quitting: 50.5  . Smokeless tobacco: Never Used  Substance and Sexual Activity  . Alcohol use: No    Alcohol/week: 0.0 oz  . Drug use: No  . Sexual activity: Not on file  Lifestyle  . Physical activity:    Days per week: Not on file    Minutes per session: Not on file  . Stress: Not on file  Relationships  . Social connections:    Talks on phone: Not on file    Gets together: Not on file    Attends religious service: Not on file    Active member of club or organization: Not on file    Attends meetings of clubs or organizations: Not on file    Relationship status: Not on file  . Intimate partner violence:    Fear of current or ex partner: Not on file    Emotionally abused: Not on file    Physically abused: Not on file    Forced sexual activity: Not on file  Other Topics Concern  . Not on file  Social History Narrative   Married, 2 children.  As of November 2015 her husband has been living in a nursing home for tendon after years as he suffers from multiple medical problems.   Patient does not drink alcohol nor does she smoke or chew tobacco products   Right handed   8th grade   1 cup daily  She worked for [employer] for 40 years.    FAMILY HISTORY: Family History  Problem Relation Age of Onset  . Osteoarthritis Mother   . Heart failure  Mother   . Colon cancer Maternal Uncle 2  . Heart Problems Father   . Cancer Sister 45       ? colon cancer   . Kidney cancer Brother 45  . Esophageal cancer Neg Hx   . Rectal cancer Neg Hx   . Stomach cancer Neg Hx   . Pancreatic cancer Neg Hx     ALLERGIES:  has No Known Allergies.  MEDICATIONS:  Current Outpatient Medications  Medication Sig Dispense Refill  . acetaminophen (TYLENOL) 325 MG tablet Take 650 mg by mouth every 6 (six) hours as needed for moderate pain.     Marland Kitchen apixaban (ELIQUIS) 5 MG TABS tablet Take 5 mg by mouth 2 (two) times daily.    . Ascorbic Acid (VITAMIN C) 1000 MG tablet Take 1,000 mg by mouth daily.    . benazepril (LOTENSIN) 40 MG tablet Take 40 mg by mouth daily.    Marland Kitchen ELIQUIS 5 MG TABS tablet TAKE 1 TABLET BY MOUTH TWICE DAILY 180 tablet 3  . esomeprazole (NEXIUM) 40 MG capsule Take 1 capsule (40 mg total) by mouth daily. 30 capsule 0  . ferrous sulfate 325 (65 FE) MG EC tablet Take 325 mg by mouth daily with breakfast.    . furosemide (LASIX) 20 MG tablet Take 1 tablet (20 mg total) by mouth daily. 30 tablet 0  . gabapentin (NEURONTIN) 600 MG tablet Take 600 mg by mouth 2 (two) times daily.    Marland Kitchen HYDROcodone-acetaminophen (NORCO/VICODIN) 5-325 MG tablet Take 1-2 tablets by mouth every 6 (six) hours as needed for moderate pain. 40 tablet 0  . LORazepam (ATIVAN) 0.5 MG tablet TAKE 1 TABLET BY MOUTH TWICE DAILY AS NEEDED FOR ANXIETY OR SLEEP 60 tablet 0  . metoprolol tartrate (LOPRESSOR) 50 MG tablet Take 1.5 tablets (75 mg total) by mouth 2 (two) times daily. 270 tablet 3  . nitroGLYCERIN (NITROSTAT) 0.4 MG SL tablet Place 1 tablet (0.4 mg total) under the tongue every 5 (five) minutes as needed for chest pain (x 3 pills). Reported on 09/01/2015 25 tablet 3  . promethazine (PHENERGAN) 12.5 MG tablet Take 12.5 mg by mouth as directed.    . sertraline (ZOLOFT) 50 MG tablet TAKE 1 TABLET BY MOUTH ONCE DAILY 90 tablet 3  . traZODone (DESYREL) 100 MG tablet TAKE  1/2 (ONE-HALF) TABLET BY MOUTH AT BEDTIME 30 tablet 5  . triamcinolone cream (KENALOG) 0.1 % Apply 1 application topically 2 (two) times daily as needed (Eczema on legs). 30 g 2  . diltiazem (CARDIZEM CD) 120 MG 24 hr capsule Take 1 capsule by mouth as needed when Heart Rate is above 902 and Systolic Blood Pressure is above 100 mm/hg (Patient not taking: Reported on 01/19/2018) 30 capsule 3   No current facility-administered medications for this visit.     REVIEW OF SYSTEMS:  Constitutional: Denies fevers, chills or abnormal night sweats (+) uses walker Eyes: Denies blurriness of vision, double vision or watery eyes Ears, nose, mouth, throat, and face: Denies mucositis or sore throat Respiratory: Denies dyspnea or wheezes. No crackles, wheezes or rhonchi on exam. (+) cough Cardiovascular: Denies palpitation, chest discomfort or  (+)left lower extremity swelling and erythema Gastrointestinal:  Denies nausea, heartburn or change in bowel habits. (+) diarrhea (+) mild epigastric abdominal pain Skin: Denies abnormal skin  rashes Lymphatics: Denies new lymphadenopathy or easy bruising Neurological:Denies numbness, tingling or new weaknesses Behavioral/Psych: Mood is stable, no new changes  MSK: No myalgia  All other systems were reviewed with the patient and are negative.  PHYSICAL EXAMINATION:  ECOG PERFORMANCE STATUS: 2-3  Vitals:   01/19/18 1001  BP: (!) 169/99  Pulse: 87  Resp: 18  Temp: 97.6 F (36.4 C)  SpO2: 96%   Filed Weights   01/19/18 1001  Weight: 190 lb 8 oz (86.4 kg)    GENERAL:alert, no distress and comfortable (+) uses walker SKIN: skin color, texture, turgor are normal, no rashes or significant lesions, diffuse skin erythema and scratch markers  EYES: normal, conjunctiva are pink and non-injected, sclera clear OROPHARYNX:no exudate, no erythema and lips, buccal mucosa, and tongue normal  (+) voice hoarseness  NECK: supple, thyroid normal size, non-tender, without  nodularity LYMPH:  no palpable lymphadenopathy in the cervical, axillary or inguinal LUNGS: clear to auscultation and percussion with normal breathing effort HEART: regular rate & rhythm and no murmurs and no lower extremity edema ABDOMEN:abdomen soft, the midline surgical wound is well healed, appears clean. (+) Mild diffuse tenderness at RLQ and epigastric area, no hepatomegaly, normal bowel sounds. Musculoskeletal:no cyanosis of digits and no clubbing (+) Ambulatory with a walker (+)  left lateral lower leg erythema and edema. PSYCH: alert & oriented x 3 with fluent speech NEURO: no focal motor/sensory deficits.   LABORATORY DATA:  I have reviewed the data as listed CBC Latest Ref Rng & Units 01/19/2018 11/07/2017 10/31/2017  WBC 3.9 - 10.3 K/uL 6.1 7.0 6.8  Hemoglobin 11.6 - 15.9 g/dL 13.7 12.2 11.1(L)  Hematocrit 34.8 - 46.6 % 42.5 38.9 37.4  Platelets 145 - 400 K/uL 128(L) 146.0(L) 163    CMP Latest Ref Rng & Units 01/19/2018 11/07/2017 10/31/2017  Glucose 70 - 99 mg/dL 95 95 102(H)  BUN 8 - 23 mg/dL 28(H) 25(H) 16  Creatinine 0.44 - 1.00 mg/dL 1.24(H) 1.34(H) 1.11(H)  Sodium 135 - 145 mmol/L 140 142 138  Potassium 3.5 - 5.1 mmol/L 4.3 4.5 3.5  Chloride 98 - 111 mmol/L 104 105 97(L)  CO2 22 - 32 mmol/L _0 Calcium 8.9 - 10.3 mg/dL 9.6 8.9 9.0  Total Protein 6.5 - 8.1 g/dL 8.1 - -  Total Bilirubin 0.3 - 1.2 mg/dL 0.5 - -  Alkaline Phos 38 - 126 U/L 59 - -  AST 15 - 41 U/L 23 - -  ALT 0 - 44 U/L 15 - -   Results for SANDREA, BOER (MRN [id number]) as of 09/16/2016 10:06  Ref. Range 03/01/2016 08:50 06/17/2016 09:50 09/10/2016 08:54  CEA Latest Ref Range: 0.0 - 4.7 ng/mL 3.1 2.6   CEA ([facility]) Latest Ref Range: 0.00 - 5.00 ng/mL 3.05 2.91 3.26   PATHOLOGY 10/28/2017 Surgical pathology Diagnosis Stomach, biopsy - ANTRAL AND OXYNTIC MUCOSA WITH SLIGHT CHRONIC INFLAMMATION. - WARTHIN-STARRY STAIN NEGATIVE FOR HELICOBACTER PYLORI. - NO INTESTINAL METAPLASIA, DYSPLASIA, OR  MALIGNANCY.  Diagnosis 12/25/2016 1. Surgical [P], transverse, polyp - TUBULAR ADENOMA (X4 FRAGMENTS). - NO HIGH GRADE DYSPLASIA OR MALIGNANCY. 2. Surgical [P], sigmoid, polyp - TUBULAR ADENOMA. - NO HIGH GRADE DYSPLASIA OR MALIGNANCY.  Colon, segmental resection for tumor, right 06/02/2014 - MULTIFOCAL INVASIVE ADENOCARCINOMA, SPANNING 8.5 CM AND 4.0 CM. - ADENOCARCINOMA INVOLVES THE SEROSA. - LYMPHOVASCULAR INVASION IS IDENTIFIED. 1 of 4 FINAL for Dimon, Jaiyla C ([id number]) Diagnosis(continued) - PERINEURAL INVASION IS IDENTIFIED. - METASTATIC ADENOCARCINOMA IN 2 OF 16 LYMPH  NODES (2/16) WITH EXTRACAPSULAR EXTENSION. - THE SURGICAL RESECTION MARGINS ARE NEGATIVE FOR ADENOCARCINOMA. - SEE ONCOLOGY TABLE BELOW. Microscopic Comment COLON AND RECTUM (INCLUDING TRANS-ANAL RESECTION): Specimen: Terminal ileum and right colon Procedure: Resection Tumor site: Ileocecal valve (tumor #1) and proximal mid ascending colon (tumor #2) Specimen integrity: Intact Macroscopic intactness of mesorectum: N/A Macroscopic tumor perforation: Not identified Invasive tumor: Tumor #1 (ileocecal valve) Maximum size: 8.5 cm (gross measurement) Histologic type(s): Adenocarcinoma Histologic grade and differentiation: G2: moderately differentiated/low grade Type of polyp in which invasive carcinoma arose: Likely tubular adenoma Microscopic extension of invasive tumor: Adenocarcinoma involves the serosa Invasive tumor: Tumor #2 (proximal mid ascending colon) Maximum size: 4.0 cm (gross measurement) Histologic type(s): Adenocarcinoma Histologic grade and differentiation: G1: well differentiated/low grade Type of polyp in which invasive carcinoma arose: Tubulovillous adenoma Microscopic extension of invasive tumor: Adenocarcinoma extends into the muscularis propria Lymph-Vascular invasion: Present Peri-neural invasion: Present Tumor deposit(s) (discontinuous extramural extension): Not  identified Resection margins: Proximal margin: 32.0 cm Distal margin: 7.5 cm Circumferential (radial) (posterior ascending, posterior descending; lateral and posterior mid-rectum; and entire lower 1/3 rectum): 3.0 cm Treatment effect (neo-adjuvant therapy): N/A Additional polyp(s): Not identified Non-neoplastic findings: No significant findings Lymph nodes: number examined 16; number positive: 2 Pathologic Staging: mT4, N1b Ancillary studies: MSI testing will be performed on the larger higher grade tumor and the results reported separately. Additional studies can be performed upon clinician request. Comments: Grossly, there are two tumors identified. The larger tumor is located at the ileocecal valve and consists of an 8.5 cm span of moderately differentiated adenocarcinoma that involves the serosa. This tumor has associated perineural and lymphovascular invasion. The second discontiguous tumor is present in the proximal right colon and spans 4.0 cm and consists of well differentiated adenocarcinoma that is morphologically dissimilar from the larger tumor. This tumor extends into the muscularis propria. Enid Cutter MD Pathologist, Electronic Signature (Case signed 06/06/2014) Specimen Gross and Clinical Information  RADIOGRAPHIC STUDIES: I have personally reviewed the radiological images as listed and agreed with the findings in the report.  CT AP 10/27/2017 IMPRESSION: 1. Mild hazy right basilar airspace opacity Weldy reflect mild asymmetric interstitial edema or possibly pneumonia. 2. Moderate hiatal hernia. 3. Scattered coronary artery calcifications. 4. Small left renal cyst.  CT-angio Chest 10/26/2017 IMPRESSION: 1. Negative for acute pulmonary embolus. 2. Right greater than left ground-glass density with increased nodularity in the right upper, middle and lower lobes, suspicious for pulmonary infection, possible atypical pneumonia. Short interval CT follow-up recommended  following antibiotic trial and ensure clearing. Persistent mediastinal adenopathy, possibly reactive and not significantly changed since January 2019  CT CAP 07/22/17 IMPRESSION: 1. No evidence of local tumor recurrence at ileocolic anastomosis in the right abdomen. 2. No evidence of metastatic disease in the abdomen or pelvis. 3. New nonspecific patchy ground-glass nodularity in the right greater than left lungs. The ground-glass density is more suggestive of an inflammatory etiology. Recommend attention on follow-up chest CT in 3 months. 4. New nonspecific mild mediastinal lymphadenopathy. Given the above findings in the lungs, these nodes Cheeks be reactive, although short-term chest CT follow-up is again recommended in 3 months. 5. Liver surface appears finely irregular, question hepatic cirrhosis. Consider hepatic elastography for further liver fibrosis risk stratification, as clinically warranted. 6. Chronic findings include: Aortic Atherosclerosis (ICD10-I70.0). Left main and 3 vessel coronary atherosclerosis. Moderate hiatal hernia. Chronic mosaic attenuation in the lungs, most commonly due to air trapping from small airways disease.  CT CAP W/ CONTRAST 09/12/2016 IMPRESSION: 1.  Status post partial right hemicolectomy, without evidence to suggest local recurrence of disease or metastatic disease to the chest, abdomen or pelvis. 2. Cardiomegaly with biatrial dilatation. 3. Aortic atherosclerosis, in addition to left main and 3 vessel coronary artery disease. 4. Moderate to large hiatal hernia.  Ct abdomen and pelvis with contrast 03/06/2016 IMPRESSION: 1. Stable remote surgical changes from a partial right hemicolectomy. No CT findings for recurrent tumor or metastatic disease. 2. Stable atherosclerotic calcifications involving the thoracic aorta, abdominal aorta and branch vessels including dense 3 vessel coronary artery calcifications. 3. Moderate to large hiatal hernia. 4.  Scoliosis and degenerative changes involving the spine. 5. No acute findings in the chest, abdomen or pelvis.   ASSESSMENT & PLAN:  84 y.o. Caucasian female, with multiple medical comorbidities, presented with anemia, abdominal pain and nausea. She underwent exploratory laparotomy, right hemicolectomy, and descending colostomy by Dr. Georgette Dover on 06/02/2014. Surgical path reviewed multifocal (2) colon cancer at  ileocecal valve and ascending colon.  1. Multifocal invasive colon adenocarcinoma, mT4N1M0, stage IIIB, grade 1, MMR normal. -I previously reviewed her surgical findings and her CT of abdomen and pelvis with patient and her extended family members in great detail today. Both of her colon cancer were completely surgically resected, however she does have high risk of cancer recurrence in the future due to locally advanced stage.  -due to her advanced age, adjuvant chemo was not recommended  -Her surveillance CT scans have not showed any definitive evidence of recurrence -She was hospitalized for congestive heart failure and GI bleeding in April 2019, she had a CT chest, abdomen and pelvis with contrast, which showed no definitive evidence of recurrence, but persistent bilateral groundglass changes in her lungs and some small lung nodules. -She is clinically stable, has mild intermittent abdominal pain, which is chronic.  She has developed cough again since a week ago.  Her exam was otherwise unremarkable.  I do not have high clinical suspicion for recurrence. -He has been 3 and half years since her initial diagnosis and surgery, we discussed her risk of recurrence has decreased significantly -New cancer surveillance, I will see her back every 6 months until in early 2021  -will repeat her CT chest wo contrast in the next few weeks   2. Genetics -Due to her family history of colon cancer, and her daughters multifocal polyps, I recommend her to have a genetic counseling. Her daughter met our  genetic counselor before and genetic testing was negative. -She declined genetic testing at this point.  3. Anemia -resolved now, continue oral iron supplement.   4. CAD, CHF -She will continue follow-up with her cardiologist Dr. Kathlen Mody -I discussed the CT scan findings of atherosclerosis, which involves her aorta and 3 coronary artery vessel, she does have frequent chest pain, I strongly encouraged her to see Dr. Kathlen Mody soon. -Pt is still taking Lasix and has minimal leg swelling. -Will get an ambulatory O2 saturation today. -Follow-up with her primary care physician for other medical issues.  5. Hiatal Hernia -We discussed that she should see PCP about this.  6. CKD stage III -Cr 1.2-1.5 -Avoid nephrotoxic agents including IV contrast and NSAIDs.  7. Persistent cough and ground-glass changes in lungs  -she had pneumonia in 04/2017 and intermittent cough since then  -Her CT scan of the chest has shown bilateral groundglass changes then, she is also symptomatically with intermittent dyspnea and a cough.  She was hospitalized in April 2019 for CHF, symptoms improved after treatment. -She has  developed cough again a week ago.  Afebrile, lung exam was unremarkable. -I will repeat her CT chest without contrast in the next few weeks, if she has persistent bilateral groundglass changes, I will refer her to pulmonologist -Not sure if she has intermittent aspiration, she does have some dysphasia and cough with swallowing.    8. Dysphagis and hoarseness - Both solids and liquids. No odynophagia. -I advised her to eat small bites -Follow-up with Dr. Henrene Pastor for further evaluation  9. Left leg skin erythema  -He has diffuse skin erythema on the lateral of lower extremity below the knee, no increased skin temperature or significant edema, not typical for cellulitis  -I encouraged her to use some over-the-counter hydrocortisone cream, and follow-up with PCP  PLAN - Lab and f/u in 6 months   -Chest CT scan w/o contrast in 1-2 weeks, will decide if she needs pulmonary referral based on her CT  - Follow-up with Dr. Henrene Pastor for dysphagia evaluation, I encourage him to call for appointment    All questions were answered. The patient knows to call the clinic with any problems, questions or concerns.  I spent 25 minutes counseling the patient face to face. The total time spent in the appointment was 30 minutes and more than 50% was on counseling.  Dierdre Searles Dweik am acting as scribe for Dr. Truitt Merle.  I have reviewed the above documentation for accuracy and completeness, and I agree with the above.   Truitt Merle  01/19/2018

## 2018-01-16 ENCOUNTER — Other Ambulatory Visit: Payer: Self-pay | Admitting: Internal Medicine

## 2018-01-16 ENCOUNTER — Other Ambulatory Visit: Payer: Self-pay

## 2018-01-16 MED ORDER — FUROSEMIDE 20 MG PO TABS
20.0000 mg | ORAL_TABLET | Freq: Every day | ORAL | 0 refills | Status: DC
Start: 2018-01-16 — End: 2018-02-18

## 2018-01-16 NOTE — Telephone Encounter (Signed)
Call to patient- patient onlt takes 2 pills once every 2-3 weeks when she has really bad swelling. She actually needs her Rx Sig corrected to state she is using 20mg  daily- so she can get it filled. It has not been corrected and the pharmacy will not fill for her. Rx refill request:  Lasix 20 mg- with corrected Sig and number  Ativan has already been pended for provider review.   LOV: 11/17/17  PCP: [place]: verified

## 2018-01-16 NOTE — Telephone Encounter (Signed)
Copied from [place] [id number]. Topic: Quick Communication - Rx Refill/Question >> Jan 16, 2018 11:08 AM Neva Seat wrote: LORazepam (ATIVAN) 0.5 MG tablet - needing refills furosemide (LASIX) 20 MG tablet - hospital changed dosage to 2 a day.  Needing a new Rx sent in so pt can get the Rx filled. Pt is out.   [place], [place] - [address] [place] [address] Phone: [phone] Fax: [phone]

## 2018-01-19 ENCOUNTER — Encounter: Payer: Self-pay | Admitting: Hematology

## 2018-01-19 ENCOUNTER — Telehealth: Payer: Self-pay

## 2018-01-19 ENCOUNTER — Inpatient Hospital Stay: Payer: PPO

## 2018-01-19 ENCOUNTER — Inpatient Hospital Stay: Payer: PPO | Attending: Hematology | Admitting: Hematology

## 2018-01-19 VITALS — BP 169/99 | HR 87 | Temp 97.6°F | Resp 18 | Ht 64.0 in | Wt 190.5 lb

## 2018-01-19 DIAGNOSIS — K219 Gastro-esophageal reflux disease without esophagitis: Secondary | ICD-10-CM | POA: Insufficient documentation

## 2018-01-19 DIAGNOSIS — M1990 Unspecified osteoarthritis, unspecified site: Secondary | ICD-10-CM | POA: Insufficient documentation

## 2018-01-19 DIAGNOSIS — Z87891 Personal history of nicotine dependence: Secondary | ICD-10-CM | POA: Diagnosis not present

## 2018-01-19 DIAGNOSIS — J449 Chronic obstructive pulmonary disease, unspecified: Secondary | ICD-10-CM | POA: Insufficient documentation

## 2018-01-19 DIAGNOSIS — Z8051 Family history of malignant neoplasm of kidney: Secondary | ICD-10-CM | POA: Diagnosis not present

## 2018-01-19 DIAGNOSIS — D649 Anemia, unspecified: Secondary | ICD-10-CM | POA: Diagnosis not present

## 2018-01-19 DIAGNOSIS — Z7901 Long term (current) use of anticoagulants: Secondary | ICD-10-CM | POA: Diagnosis not present

## 2018-01-19 DIAGNOSIS — R1310 Dysphagia, unspecified: Secondary | ICD-10-CM | POA: Insufficient documentation

## 2018-01-19 DIAGNOSIS — D509 Iron deficiency anemia, unspecified: Secondary | ICD-10-CM

## 2018-01-19 DIAGNOSIS — R490 Dysphonia: Secondary | ICD-10-CM | POA: Insufficient documentation

## 2018-01-19 DIAGNOSIS — R197 Diarrhea, unspecified: Secondary | ICD-10-CM | POA: Insufficient documentation

## 2018-01-19 DIAGNOSIS — I5032 Chronic diastolic (congestive) heart failure: Secondary | ICD-10-CM | POA: Insufficient documentation

## 2018-01-19 DIAGNOSIS — C182 Malignant neoplasm of ascending colon: Secondary | ICD-10-CM

## 2018-01-19 DIAGNOSIS — Z79899 Other long term (current) drug therapy: Secondary | ICD-10-CM | POA: Diagnosis not present

## 2018-01-19 DIAGNOSIS — I2510 Atherosclerotic heart disease of native coronary artery without angina pectoris: Secondary | ICD-10-CM | POA: Diagnosis not present

## 2018-01-19 DIAGNOSIS — Z8673 Personal history of transient ischemic attack (TIA), and cerebral infarction without residual deficits: Secondary | ICD-10-CM | POA: Insufficient documentation

## 2018-01-19 DIAGNOSIS — I110 Hypertensive heart disease with heart failure: Secondary | ICD-10-CM | POA: Diagnosis not present

## 2018-01-19 LAB — CBC WITH DIFFERENTIAL (CANCER CENTER ONLY)
Basophils Absolute: 0 10*3/uL (ref 0.0–0.1)
Basophils Relative: 0 %
Eosinophils Absolute: 0.1 10*3/uL (ref 0.0–0.5)
Eosinophils Relative: 2 %
HCT: 42.5 % (ref 34.8–46.6)
Hemoglobin: 13.7 g/dL (ref 11.6–15.9)
Lymphocytes Relative: 22 %
Lymphs Abs: 1.4 10*3/uL (ref 0.9–3.3)
MCH: 29.9 pg (ref 25.1–34.0)
MCHC: 32.2 g/dL (ref 31.5–36.0)
MCV: 92.8 fL (ref 79.5–101.0)
Monocytes Absolute: 0.5 10*3/uL (ref 0.1–0.9)
Monocytes Relative: 9 %
Neutro Abs: 4.1 10*3/uL (ref 1.5–6.5)
Neutrophils Relative %: 67 %
Platelet Count: 128 10*3/uL — ABNORMAL LOW (ref 145–400)
RBC: 4.58 MIL/uL (ref 3.70–5.45)
RDW: 19.5 % — ABNORMAL HIGH (ref 11.2–14.5)
WBC Count: 6.1 10*3/uL (ref 3.9–10.3)

## 2018-01-19 LAB — COMPREHENSIVE METABOLIC PANEL WITH GFR
ALT: 15 U/L (ref 0–44)
AST: 23 U/L (ref 15–41)
Albumin: 4.4 g/dL (ref 3.5–5.0)
Alkaline Phosphatase: 59 U/L (ref 38–126)
Anion gap: 9 (ref 5–15)
BUN: 28 mg/dL — ABNORMAL HIGH (ref 8–23)
CO2: 27 mmol/L (ref 22–32)
Calcium: 9.6 mg/dL (ref 8.9–10.3)
Chloride: 104 mmol/L (ref 98–111)
Creatinine, Ser: 1.24 mg/dL — ABNORMAL HIGH (ref 0.44–1.00)
GFR calc Af Amer: 45 mL/min — ABNORMAL LOW
GFR calc non Af Amer: 38 mL/min — ABNORMAL LOW
Glucose, Bld: 95 mg/dL (ref 70–99)
Potassium: 4.3 mmol/L (ref 3.5–5.1)
Sodium: 140 mmol/L (ref 135–145)
Total Bilirubin: 0.5 mg/dL (ref 0.3–1.2)
Total Protein: 8.1 g/dL (ref 6.5–8.1)

## 2018-01-19 LAB — IRON AND TIBC
Iron: 129 ug/dL (ref 41–142)
Saturation Ratios: 45 % (ref 21–57)
TIBC: 290 ug/dL (ref 236–444)
UIBC: 161 ug/dL

## 2018-01-19 LAB — FERRITIN: Ferritin: 101 ng/mL (ref 11–307)

## 2018-01-19 LAB — CEA (IN HOUSE-CHCC): CEA (CHCC-In House): 4.18 ng/mL (ref 0.00–5.00)

## 2018-01-19 NOTE — Telephone Encounter (Signed)
Printed avs and calender of upcoming appointment per 7/8 los. Add on for labs/checked in

## 2018-01-22 ENCOUNTER — Telehealth: Payer: Self-pay

## 2018-01-22 ENCOUNTER — Telehealth: Payer: Self-pay | Admitting: Internal Medicine

## 2018-01-22 NOTE — Telephone Encounter (Signed)
-----   Message from Truitt Merle, MD sent at 01/21/2018  7:36 AM EDT ----- Please let pt know her lab results, iron deficiency resolved, and her tumor marker CEA was also normal. Thanks   Truitt Merle  01/21/2018

## 2018-01-22 NOTE — Telephone Encounter (Signed)
Pt called to check on RX for lorazepam stating the pharmacy told her it was not received. I contacted [facility] and was told they did not receive a phoned or faxed RX on 01/12/18 as indicated in notes and RX file. Please resend this RX.  [place], [place] - [address] [phone] (Phone) [phone] (Fax)    Pt also stated they did not have RX for lasix for 1/day. Pharmacy stated they filled this and it was picked up 01/19/18. Pt states she didn't even go to [facility] on 01/19/18 and they are wrong. Pt was advised to contact [facility].

## 2018-01-22 NOTE — Telephone Encounter (Signed)
Per Dr. Burr Medico left voice message on home phone with lab results, iron deficiency resolved, tumor marker is normal.  Encouraged her to call back if she has questions.

## 2018-01-22 NOTE — Telephone Encounter (Signed)
Duplicate. See 01/14/18 phone note.

## 2018-01-22 NOTE — Telephone Encounter (Signed)
Copied from [place] [id number]. Topic: Quick Communication - See Telephone Encounter >> Jan 22, 2018  2:21 PM Percell Belt A wrote: CRM for notification. See Telephone encounter for: 01/22/18.  Pharmacy called in and stated they they never rec the refill for the LORazepam (ATIVAN) 0.5 MG tablet [[id number]] .  Pt is upset because she has not beable to get this med.  Pharmacy is requesting a call back   [place], [place] - [address] [phone] (Phone)

## 2018-01-27 NOTE — Telephone Encounter (Signed)
Medication sent in. Pt daughter notified. No further action needed.

## 2018-01-29 ENCOUNTER — Other Ambulatory Visit: Payer: Self-pay | Admitting: *Deleted

## 2018-01-29 ENCOUNTER — Encounter: Payer: Self-pay | Admitting: *Deleted

## 2018-01-29 NOTE — Patient Outreach (Signed)
[facility]) Care Management [place] Telephone Outreach, case Closure  01/29/2018  [name] May 19, 1930 [id number]  Successful telephone outreach [name], daughter/ caregiver, on [facility] CM written consent, forLestie C [name],84 y.o.femalereferred to [place] after recent hospitalization April 14-19, 2019 for fatigue, N/V, Acute on chronic CHF, and possible CAP.Patient was discharged home with home health services for nursing only, as she refused SNF placement for short-term rehabilitation and home health PT services. Patient has history including, but not limited to, CAD; dCHF; A-Fib, on ACT; GERD; and HTN/ HLD. HIPAA/ identity verifiedwith patient's daughter/ caregiver.  Today, [name] reports that patient continues "doing absolutely great," and she again confirms that patient has continued staying at her own home where she lives alone. Reports that patient has continued getting out and about with friends, doing some "light yard work" and she denies that patient is in pain, has had new/ recent falls, or is any distress.   Caregiver further reports:  --attended recent oncology provider appointment for "annual check in," states visit went well; patient continues to be followed by oncology as regular/ routine follow up.  Upcoming appointments reviewed with daughter-- who verbalizes plans to continue transporting/ attending with patient -- noconcerns or recent changes aroundmedications; patient continues self- managing medications independently -- denies that patient has had anyconcerning signs/ symptoms/ episodes: patient continues monitoring and recording daily weights and blood pressures; reports "no issues or problems" for "many months" now. -- caregivercontinues toverbalizes excellent understanding of CHF zones and corresponding action plans, along with weight gain guidelines in setting of CHF, and also reports patienthasconsistently  remainedin "green zone."   Patient's caregiverdenies further issues, concerns, or problems today. Again discussed with caregiver that patient has thus far met her previously established [place] CM goals, and possibility of case closure, which caregiver again agrees with.  caregiver denies further care coordination needs, and declines [facility] RN [place] transition, stating that she is unable to take regular calls due to her work schedule, and patient "doesn't hear good on the phone and will not answer her phone for unknown callers."  Daughter/ caregiver agrees to contact [facility] CM if needs, questions, issues, or concerns arise in the future, and I confirmed that patient/ caregiverhavemy direct phone number, the main [place] office phone number, and the [facility] CM 24-hour nurse advice phone numbers.  Plan:  Will close [place] CM case, as patient/ caregiver have successfully met previously established goals and deny further care cooridnation needs, and will make patient's PCP aware of same-- will send PCP case closure letter.  [facility] CM Care Plan Problem Two     Most Recent Value  Care Plan Problem Two  Self-health management deficits for chronic disease state of CHF, as evidenced by recent admission for CHF exacerbation  Role Documenting the Problem Two  Care Management Coordinator  Care Plan for Problem Two  Not Active  Interventions for Problem Two Long Term Goal   Confirmed with patient's caregiver that patient continues monitoring and recording daily weights and that she is able verbalize signs/ symptoms yellow CHF zone with corresponding action plan  [facility] Long Term Goal  Over the next 68 days, patient will be able to verbalize signs/ symptoms of yellow CHF zone along with corresponding action plan, as ecidenced by patient/ caregiver reporting during [facility] RN CCm outreach  [facility] Long Term Goal Start Date  11/27/17  [facility] Long Term Goal Met Date  01/29/18 [facility] Met]  It has been a  pleasure caring for Whitney Kluver, RN, BSN, [employer] Coordinator [facility] Care Management  [phone]

## 2018-02-18 ENCOUNTER — Ambulatory Visit (INDEPENDENT_AMBULATORY_CARE_PROVIDER_SITE_OTHER): Payer: PPO | Admitting: Internal Medicine

## 2018-02-18 ENCOUNTER — Encounter: Payer: Self-pay | Admitting: Internal Medicine

## 2018-02-18 VITALS — BP 120/64 | HR 73 | Temp 98.2°F | Wt 189.9 lb

## 2018-02-18 DIAGNOSIS — I482 Chronic atrial fibrillation, unspecified: Secondary | ICD-10-CM

## 2018-02-18 DIAGNOSIS — E7800 Pure hypercholesterolemia, unspecified: Secondary | ICD-10-CM | POA: Diagnosis not present

## 2018-02-18 DIAGNOSIS — I5032 Chronic diastolic (congestive) heart failure: Secondary | ICD-10-CM

## 2018-02-18 DIAGNOSIS — I10 Essential (primary) hypertension: Secondary | ICD-10-CM | POA: Diagnosis not present

## 2018-02-18 MED ORDER — ESOMEPRAZOLE MAGNESIUM 40 MG PO CPDR: 40.0000 mg | DELAYED_RELEASE_CAPSULE | Freq: Every day | ORAL | 4 refills | Status: DC

## 2018-02-18 MED ORDER — APIXABAN 5 MG PO TABS: 5.0000 mg | ORAL_TABLET | Freq: Two times a day (BID) | ORAL | 3 refills | Status: DC

## 2018-02-18 MED ORDER — LORAZEPAM 0.5 MG PO TABS: ORAL_TABLET | ORAL | 3 refills | Status: DC

## 2018-02-18 MED ORDER — GABAPENTIN 600 MG PO TABS: 600.0000 mg | ORAL_TABLET | Freq: Two times a day (BID) | ORAL | 3 refills | Status: DC

## 2018-02-18 MED ORDER — TRAZODONE HCL 100 MG PO TABS: ORAL_TABLET | ORAL | 5 refills | Status: DC

## 2018-02-18 MED ORDER — FUROSEMIDE 20 MG PO TABS: 20.0000 mg | ORAL_TABLET | Freq: Every day | ORAL | 4 refills | Status: DC

## 2018-02-18 MED ORDER — SERTRALINE HCL 50 MG PO TABS: 50.0000 mg | ORAL_TABLET | Freq: Every day | ORAL | 3 refills | Status: DC

## 2018-02-18 MED ORDER — DILTIAZEM HCL ER COATED BEADS 120 MG PO CP24: ORAL_CAPSULE | ORAL | 3 refills | Status: DC

## 2018-02-18 MED ORDER — METOPROLOL TARTRATE 50 MG PO TABS: 75.0000 mg | ORAL_TABLET | Freq: Two times a day (BID) | ORAL | 3 refills | Status: DC

## 2018-02-18 MED ORDER — BENAZEPRIL HCL 40 MG PO TABS: 40.0000 mg | ORAL_TABLET | Freq: Every day | ORAL | 4 refills | Status: DC

## 2018-02-18 MED ORDER — HYDROCODONE-ACETAMINOPHEN 5-325 MG PO TABS: 1.0000 | ORAL_TABLET | Freq: Four times a day (QID) | ORAL | 0 refills | Status: DC | PRN

## 2018-02-18 NOTE — Patient Instructions (Signed)
Limit your sodium (Salt) intake  Cardiology and oncology follow-up as scheduled  Return in 4 months for follow-up

## 2018-03-20 ENCOUNTER — Encounter: Payer: Self-pay | Admitting: Family Medicine

## 2018-03-20 ENCOUNTER — Ambulatory Visit (INDEPENDENT_AMBULATORY_CARE_PROVIDER_SITE_OTHER): Payer: PPO | Admitting: Family Medicine

## 2018-03-20 ENCOUNTER — Telehealth: Payer: Self-pay | Admitting: Internal Medicine

## 2018-03-20 VITALS — BP 140/90 | HR 107 | Temp 98.3°F | Wt 193.5 lb

## 2018-03-20 DIAGNOSIS — I5032 Chronic diastolic (congestive) heart failure: Secondary | ICD-10-CM

## 2018-03-20 DIAGNOSIS — J181 Lobar pneumonia, unspecified organism: Secondary | ICD-10-CM

## 2018-03-20 DIAGNOSIS — J189 Pneumonia, unspecified organism: Secondary | ICD-10-CM

## 2018-03-20 DIAGNOSIS — R062 Wheezing: Secondary | ICD-10-CM

## 2018-03-20 MED ORDER — DOXYCYCLINE HYCLATE 100 MG PO TABS: 100.0000 mg | ORAL_TABLET | Freq: Two times a day (BID) | ORAL | 0 refills | Status: DC

## 2018-03-20 MED ORDER — E-Z SPACER DEVI: 2 refills | Status: DC

## 2018-03-20 MED ORDER — IPRATROPIUM-ALBUTEROL 0.5-2.5 (3) MG/3ML IN SOLN
3.0000 mL | Freq: Once | RESPIRATORY_TRACT | Status: AC
  Administered 2018-03-20: 3 mL via RESPIRATORY_TRACT

## 2018-03-20 MED ORDER — ALBUTEROL SULFATE HFA 108 (90 BASE) MCG/ACT IN AERS
2.0000 | INHALATION_SPRAY | Freq: Four times a day (QID) | RESPIRATORY_TRACT | 0 refills | Status: DC | PRN
Start: 2018-03-20 — End: 2018-05-01

## 2018-03-20 NOTE — Telephone Encounter (Signed)
Spoke with pt daughter voiced understanding that pt needs to be seen at the office, pt is scheduled to see dr Ethlyn Gallery today at 4.30 pm per Ria Comment.

## 2018-03-20 NOTE — Progress Notes (Signed)
Elaina Cara Ohair DOB: 1929/10/22 Encounter date: 03/20/2018  This is a 84 y.o. female who presents with Chief Complaint  Patient presents with  . Cough    x 1 week, sore throat, left ear pain, stuffy nose, coughing up brown mucus, runny rose and drainage, body aches,     History of present illness:  Has had pneumonia a couple of times in less than a year and worries that she might have it again. Does not want to end up back in hospital.   Cough is productive of dark yellow phlegm (not brown). Worsening over last week.   Hurts in back and up in rib cage. Just achy everywhere. Hasn't had fevers at home; just a little elevated. Does feel short of breath.   Also notes that legs are a little more swollen than usual.     No Known Allergies Current Meds  Medication Sig  . acetaminophen (TYLENOL) 325 MG tablet Take 650 mg by mouth every 6 (six) hours as needed for moderate pain.   Marland Kitchen apixaban (ELIQUIS) 5 MG TABS tablet Take 1 tablet (5 mg total) by mouth 2 (two) times daily.  . Ascorbic Acid (VITAMIN C) 1000 MG tablet Take 1,000 mg by mouth daily.  . benazepril (LOTENSIN) 40 MG tablet Take 1 tablet (40 mg total) by mouth daily.  Marland Kitchen diltiazem (CARDIZEM CD) 120 MG 24 hr capsule Take 1 capsule by mouth as needed when Heart Rate is above 235 and Systolic Blood Pressure is above 100 mm/hg  . esomeprazole (NEXIUM) 40 MG capsule Take 1 capsule (40 mg total) by mouth daily.  . ferrous sulfate 325 (65 FE) MG EC tablet Take 325 mg by mouth daily with breakfast.  . furosemide (LASIX) 20 MG tablet Take 1 tablet (20 mg total) by mouth daily.  Marland Kitchen gabapentin (NEURONTIN) 600 MG tablet Take 1 tablet (600 mg total) by mouth 2 (two) times daily.  Marland Kitchen HYDROcodone-acetaminophen (NORCO/VICODIN) 5-325 MG tablet Take 1-2 tablets by mouth every 6 (six) hours as needed for moderate pain.  Marland Kitchen LORazepam (ATIVAN) 0.5 MG tablet 1 tablet twice daily as needed for anxiety or sleep  . metoprolol tartrate (LOPRESSOR) 50 MG tablet  Take 1.5 tablets (75 mg total) by mouth 2 (two) times daily.  . nitroGLYCERIN (NITROSTAT) 0.4 MG SL tablet Place 1 tablet (0.4 mg total) under the tongue every 5 (five) minutes as needed for chest pain (x 3 pills). Reported on 09/01/2015  . promethazine (PHENERGAN) 12.5 MG tablet Take 12.5 mg by mouth as directed.  . sertraline (ZOLOFT) 50 MG tablet Take 1 tablet (50 mg total) by mouth daily.  . traZODone (DESYREL) 100 MG tablet TAKE 1/2 (ONE-HALF) TABLET BY MOUTH AT BEDTIME  . triamcinolone cream (KENALOG) 0.1 % Apply 1 application topically 2 (two) times daily as needed (Eczema on legs).    Review of Systems  Constitutional: Positive for fatigue. Negative for fever.  HENT: Positive for congestion and ear pain (left). Negative for sinus pressure and sinus pain.   Respiratory: Positive for cough, chest tightness (resolves after duoneb), shortness of breath and wheezing.   Cardiovascular: Positive for leg swelling. Negative for chest pain and palpitations (chronic a fib; not symptomatic).  Musculoskeletal: Positive for arthralgias, back pain and gait problem.       Feels achy all over with current illness, but does have chronic joint pain from multiple back/knee surgeries    Objective:  BP 140/90 (BP Location: Left Arm, Patient Position: Sitting, Cuff Size: Large)  Pulse (!) 107   Temp 98.3 F (36.8 C) (Oral)   Wt 193 lb 8 oz (87.8 kg)   SpO2 95%   BMI 33.21 kg/m   Weight: 193 lb 8 oz (87.8 kg)   BP Readings from Last 3 Encounters:  03/20/18 140/90  02/18/18 120/64  01/19/18 (!) 169/99   Wt Readings from Last 3 Encounters:  03/20/18 193 lb 8 oz (87.8 kg)  02/18/18 189 lb 14.4 oz (86.1 kg)  01/19/18 190 lb 8 oz (86.4 kg)    Physical Exam  Constitutional: She appears well-developed and well-nourished. No distress.  HENT:  Right Ear: Tympanic membrane, external ear and ear canal normal.  Left Ear: Tympanic membrane, external ear and ear canal normal.  Mouth/Throat: Uvula is  midline, oropharynx is clear and moist and mucous membranes are normal.  Cardiovascular: Normal rate. An irregularly irregular rhythm present. Exam reveals no friction rub.  Murmur heard.  Systolic murmur is present with a grade of 1/6. 2+ bilat pitting edema  Pulmonary/Chest: Effort normal. No respiratory distress. She has decreased breath sounds (improved after duoneb). She has wheezes (resolved after duoneb). She has no rhonchi. She has rales in the right middle field.  Psychiatric: She has a normal mood and affect.    Assessment/Plan 1. Pneumonia of right middle lobe due to infectious organism ([place]) abx as directed. Albuterol TID for next couple of days, then prn. We will check in with her on Monday and determine follow up at that point. If worsening of symptoms over weekend needs to be seen.  - doxycycline (VIBRA-TABS) 100 MG tablet; Take 1 tablet (100 mg total) by mouth 2 (two) times daily.  Dispense: 20 tablet; Refill: 0  2. Wheezing See above - ipratropium-albuterol (DUONEB) 0.5-2.5 (3) MG/3ML nebulizer solution 3 mL - PR INHAL RX, AIRWAY OBST/DX SPUTUM INDUCT - albuterol (PROVENTIL HFA;VENTOLIN HFA) 108 (90 Base) MCG/ACT inhaler; Inhale 2 puffs into the lungs every 6 (six) hours as needed for wheezing or shortness of breath.  Dispense: 1 Inhaler; Refill: 0 - Spacer/Aero-Holding Chambers (E-Z SPACER) inhaler; Use as instructed  Dispense: 1 each; Refill: 2  3. Diastolic HF: increase lasix to BID for next few days. Monitor LE edema and breathing. If worsening needs evaluation.       Micheline Rough, MD

## 2018-03-20 NOTE — Patient Instructions (Addendum)
For 3 days increase lasix to twice daily (I recommend taking second dose in late afternoon and not at bedtime so that you are not getting up all night to use the bathroom).  Take antibiotic as directed. Let us know if any worsening of symptoms or if not feeling significantly improved by next week.   Can use the albuterol a few times/day to help with wheezing. Use spacer with inhaler.

## 2018-03-20 NOTE — Telephone Encounter (Signed)
Copied from [place] [id number]. Topic: Quick Communication - Rx Refill/Question >> Mar 20, 2018 12:37 PM Oliver Pila B wrote: Pt called about a cough and congestion also green goop she is coughing up; pt is worried about pneumonia; pt has tried OTC medications nothing is working; pt would like pcp to call in a medication; contact to advise

## 2018-03-20 NOTE — Telephone Encounter (Signed)
Dr. Raliegh Ip is not in the office today and his assistant has already left for the day. Can you check with Dr. Volanda Napoleon to review, most likely pt would need a office visit.

## 2018-03-23 ENCOUNTER — Telehealth: Payer: Self-pay

## 2018-03-23 NOTE — Telephone Encounter (Signed)
Daughter answered the phone and stated that [name] is doing a little better but is not showing as much improvement as they would like. Pt's daughter stated that they would see how she felt tomorrow and if she had not improved any more they would schedule a follow up.

## 2018-03-26 ENCOUNTER — Encounter: Payer: Self-pay | Admitting: Family Medicine

## 2018-03-26 ENCOUNTER — Ambulatory Visit (INDEPENDENT_AMBULATORY_CARE_PROVIDER_SITE_OTHER): Payer: PPO

## 2018-03-26 ENCOUNTER — Ambulatory Visit (INDEPENDENT_AMBULATORY_CARE_PROVIDER_SITE_OTHER): Payer: PPO | Admitting: Family Medicine

## 2018-03-26 VITALS — BP 118/60 | HR 85 | Temp 97.8°F | Ht 64.0 in | Wt 191.5 lb

## 2018-03-26 DIAGNOSIS — R062 Wheezing: Secondary | ICD-10-CM

## 2018-03-26 DIAGNOSIS — R079 Chest pain, unspecified: Secondary | ICD-10-CM | POA: Diagnosis not present

## 2018-03-26 DIAGNOSIS — R5383 Other fatigue: Secondary | ICD-10-CM | POA: Diagnosis not present

## 2018-03-26 DIAGNOSIS — J189 Pneumonia, unspecified organism: Secondary | ICD-10-CM

## 2018-03-26 LAB — CBC WITH DIFFERENTIAL/PLATELET
Basophils Absolute: 0.1 10*3/uL (ref 0.0–0.1)
Basophils Relative: 0.7 % (ref 0.0–3.0)
Eosinophils Absolute: 0.1 10*3/uL (ref 0.0–0.7)
Eosinophils Relative: 1.1 % (ref 0.0–5.0)
HCT: 41.4 % (ref 36.0–46.0)
Hemoglobin: 13.8 g/dL (ref 12.0–15.0)
Lymphocytes Relative: 18 % (ref 12.0–46.0)
Lymphs Abs: 1.5 10*3/uL (ref 0.7–4.0)
MCHC: 33.2 g/dL (ref 30.0–36.0)
MCV: 96.3 fl (ref 78.0–100.0)
Monocytes Absolute: 0.7 10*3/uL (ref 0.1–1.0)
Monocytes Relative: 8.4 % (ref 3.0–12.0)
Neutro Abs: 5.8 10*3/uL (ref 1.4–7.7)
Neutrophils Relative %: 71.8 % (ref 43.0–77.0)
Platelets: 145 10*3/uL — ABNORMAL LOW (ref 150.0–400.0)
RBC: 4.3 Mil/uL (ref 3.87–5.11)
RDW: 14.2 % (ref 11.5–15.5)
WBC: 8.1 10*3/uL (ref 4.0–10.5)

## 2018-03-26 LAB — BASIC METABOLIC PANEL
BUN: 39 mg/dL — ABNORMAL HIGH (ref 6–23)
CO2: 29 mEq/L (ref 19–32)
Calcium: 9.2 mg/dL (ref 8.4–10.5)
Chloride: 104 mEq/L (ref 96–112)
Creatinine, Ser: 1.3 mg/dL — ABNORMAL HIGH (ref 0.40–1.20)
GFR: 41.27 mL/min — ABNORMAL LOW (ref >60.00)
Glucose, Bld: 106 mg/dL — ABNORMAL HIGH (ref 70–99)
Potassium: 4.6 mEq/L (ref 3.5–5.1)
Sodium: 141 mEq/L (ref 135–145)

## 2018-03-26 NOTE — Patient Instructions (Signed)
BEFORE YOU LEAVE: -labs/xray -follow up: 1 week with [name] - reports this will be her PCP now and has NPV  Further treatment pending xray/lab results.  Use the tessalon and albuterol as needed per instruction.  Seek emergency care if fevers, difficulty breathing, not responding to treatments or significant worsening or severe symptoms.

## 2018-03-26 NOTE — Progress Notes (Signed)
HPI:  Using dictation device. Unfortunately this device frequently misinterprets words/phrases.  [name] is a pleasant 84 yo with a PMH significant for colon ca, pneumonia, asthma, allergies and heat disease here for follow up of CAP: -sees 9/6 and pneumonia diagnosed on exam, started on doxy  -today reports: no real improvement, symptoms include nasal congestion, cough with some yellow sputum, wheezing that responds to albuterol -denies:fevers, NV, CP, increased swelling, hemoptysis -on ROC had CT chest in April for ? atypical infection with recs for repeat CT after treatment; looks like her oncologist ordered, but she has not done  ROS: See pertinent positives and negatives per HPI.  Past Medical History:  Diagnosis Date  . Adenocarcinoma, colon [facility]) Oncologist-- Dr Truitt Merle   Multifocal (2) Colon cancer @ ileocecal valve and ascending ( mT4N1Mx), Stage IIIB, grade I, MMR normal , 2 of 16 +lymph nodes, negative surgical margins---  06-02-2014  Right hemicolectomy w/ colostomy  . Allergy   . Anxiety   . Asthma    inhaler "sometimes"  . CAD (coronary artery disease)    a. LHC 4/09: pLAD 40, mDx 70-75, pLCx 30, mLCx 50, mRCA 90, EF 60% >> PCI: BMS x2 to RCA;  b. Myoview 8/12: normal  . Cataract    bil cateracts removed  . Chronic anemia   . Chronic diastolic CHF (congestive heart failure) (HCC)    a. Echo 912 - Mild LVH, EF 55-60%, no RWMA, Gr 1 DD, mild MR, mild LAE, mild RAE, PASP 59 mmHg (mod to severe pulmo HTN)  . Clotting disorder (HCC)    hx of dvt, tia on eliquis  . Colostomy in place [facility])    s/p colostomy takedown 5/16  . Colovaginal fistula    s/p colostomy >> colostomy takedown 5/16  . COPD with chronic bronchitis ([place])   . Depression   . Dysrhythmia    A fib  . Essential hypertension   . GERD (gastroesophageal reflux disease)   . History of adenomatous polyp of colon   . History of blood transfusion   . History of DVT of lower extremity   . History of  hiatal hernia   . History of TIA (transient ischemic attack)    a. Head CT 6/15: small chronic lacunar infarct in thalamus  . Hyperlipidemia   . OA (osteoarthritis)   . Osteoporosis   . Peripheral neuropathy   . Pneumonia   . Pulmonary HTN ([place])    a. PASP on Echo in 9/12:  59 mmHg  . Renal insufficiency   . Sigmoid diverticulosis    s/p sigmoid colectomy  . Stroke ([place])    TIAs  . Wears dentures   . Wears glasses     Past Surgical History:  Procedure Laterality Date  . ABDOMINAL HYSTERECTOMY    . ANTERIOR CERVICAL DECOMP/DISCECTOMY FUSION  01-31-2009   C5 -- 7  . APPENDECTOMY    . Bone spurs Bilateral    Feet  . CARDIOVASCULAR STRESS TEST  02-26-2011   Normal lexiscan no exercise study/  no ischemia/  normal LV function and wall motion, ef 67%  . CARPAL TUNNEL RELEASE Right   . CATARACT EXTRACTION W/ INTRAOCULAR LENS  IMPLANT, BILATERAL Bilateral   . CHOLECYSTECTOMY    . COLOSTOMY N/A 06/02/2014   Procedure: DIVERTING DESCENDING END COLOSTOMY;  Surgeon: Donnie Mesa, MD;  Location: [place];  Service: General;  Laterality: N/A;  . CORONARY ANGIOPLASTY WITH STENT PLACEMENT  11-04-2007  dr bensimhon   BMS  x2 to RCA/  mild Non-obstructive disease LAD/  normal LVF  . DILATION AND CURETTAGE OF UTERUS    . ESOPHAGOGASTRODUODENOSCOPY N/A 10/28/2017   Procedure: ESOPHAGOGASTRODUODENOSCOPY (EGD);  Surgeon: Yetta Flock, MD;  Location: [facility] ENDOSCOPY;  Service: Gastroenterology;  Laterality: N/A;  . EVALUATION UNDER ANESTHESIA WITH ANAL FISTULECTOMY N/A 10/13/2014   Procedure: ANAL EXAM UNDER ANESTHESIA ;  Surgeon: [name], MD;  [facility];  Service: General;  Laterality: N/A;  . HAND SURGERY     Tendon repair  . LAPAROSCOPIC SIGMOID COLECTOMY N/A 11/24/2014   Procedure: SIGMOID COLECTOMY AND COLSOTOMY CLOSURE;  Surgeon: Donnie Mesa, MD;  Location: [name];  Service: General;  Laterality: N/A;  . LUMBAR LAMINECTOMY  10-29-2002,  1969   Left  L3 -- 4  decompression   . PARTIAL COLECTOMY N/A 06/02/2014   Procedure: RIGHT PARTIAL COLECTOMY ;  Surgeon: Donnie Mesa, MD;  Location: [place];  Service: General;  Laterality: N/A;  . PATELLECTOMY Right 09/14/2013   Procedure: PATELLECTOMY;  Surgeon: Meredith Pel, MD;  Location: [place];  Service: Orthopedics;  Laterality: Right;  . PROCTOSCOPY N/A 10/13/2014   Procedure: RIDGE PROCTOSCOPY;  Surgeon: [name], MD;  [facility];  Service: General;  Laterality: N/A;  . REVISION TOTAL KNEE ARTHROPLASTY Bilateral right  04-02-2011/  left 1996 & 10-05-1999  . ROTATOR CUFF REPAIR Bilateral   . TONSILLECTOMY    . TOTAL KNEE ARTHROPLASTY Bilateral left 1994/  right 2000  . TOTAL KNEE REVISION Left 08/02/2015   Procedure: LEFT FEMORAL REVISION;  Surgeon: Gaynelle Arabian, MD;  [facility];  Service: Orthopedics;  Laterality: Left;  . TRANSTHORACIC ECHOCARDIOGRAM  12-01-2009   Grade I diastolic dysfunction/  ef 60%/  moderate MR/  mild TR    Family History  Problem Relation Age of Onset  . Osteoarthritis Mother   . Heart failure Mother   . Colon cancer Maternal Uncle 41  . Heart Problems Father   . Cancer Sister 28       ? colon cancer   . Kidney cancer Brother 25  . Esophageal cancer Neg Hx   . Rectal cancer Neg Hx   . Stomach cancer Neg Hx   . Pancreatic cancer Neg Hx     SOCIAL HX: see hpi   Current Outpatient Medications:  .  acetaminophen (TYLENOL) 325 MG tablet, Take 650 mg by mouth every 6 (six) hours as needed for moderate pain. , Disp: , Rfl:  .  albuterol (PROVENTIL HFA;VENTOLIN HFA) 108 (90 Base) MCG/ACT inhaler, Inhale 2 puffs into the lungs every 6 (six) hours as needed for wheezing or shortness of breath., Disp: 1 Inhaler, Rfl: 0 .  apixaban (ELIQUIS) 5 MG TABS tablet, Take 1 tablet (5 mg total) by mouth 2 (two) times daily., Disp: 180 tablet, Rfl: 3 .  Ascorbic Acid (VITAMIN C) 1000 MG tablet, Take 1,000 mg by mouth daily., Disp: , Rfl:  .  benazepril (LOTENSIN) 40 MG tablet, Take  1 tablet (40 mg total) by mouth daily., Disp: 90 tablet, Rfl: 4 .  diltiazem (CARDIZEM CD) 120 MG 24 hr capsule, Take 1 capsule by mouth as needed when Heart Rate is above 696 and Systolic Blood Pressure is above 100 mm/hg, Disp: 90 capsule, Rfl: 3 .  doxycycline (VIBRA-TABS) 100 MG tablet, Take 1 tablet (100 mg total) by mouth 2 (two) times daily., Disp: 20 tablet, Rfl: 0 .  esomeprazole (NEXIUM) 40 MG capsule, Take 1 capsule (40 mg total) by mouth  daily., Disp: 90 capsule, Rfl: 4 .  ferrous sulfate 325 (65 FE) MG EC tablet, Take 325 mg by mouth daily with breakfast., Disp: , Rfl:  .  furosemide (LASIX) 20 MG tablet, Take 1 tablet (20 mg total) by mouth daily., Disp: 90 tablet, Rfl: 4 .  gabapentin (NEURONTIN) 600 MG tablet, Take 1 tablet (600 mg total) by mouth 2 (two) times daily., Disp: 180 tablet, Rfl: 3 .  HYDROcodone-acetaminophen (NORCO/VICODIN) 5-325 MG tablet, Take 1-2 tablets by mouth every 6 (six) hours as needed for moderate pain., Disp: 40 tablet, Rfl: 0 .  LORazepam (ATIVAN) 0.5 MG tablet, 1 tablet twice daily as needed for anxiety or sleep, Disp: 60 tablet, Rfl: 3 .  metoprolol tartrate (LOPRESSOR) 50 MG tablet, Take 1.5 tablets (75 mg total) by mouth 2 (two) times daily., Disp: 270 tablet, Rfl: 3 .  nitroGLYCERIN (NITROSTAT) 0.4 MG SL tablet, Place 1 tablet (0.4 mg total) under the tongue every 5 (five) minutes as needed for chest pain (x 3 pills). Reported on 09/01/2015, Disp: 25 tablet, Rfl: 3 .  promethazine (PHENERGAN) 12.5 MG tablet, Take 12.5 mg by mouth as directed., Disp: , Rfl:  .  sertraline (ZOLOFT) 50 MG tablet, Take 1 tablet (50 mg total) by mouth daily., Disp: 90 tablet, Rfl: 3 .  Spacer/Aero-Holding Chambers (E-Z SPACER) inhaler, Use as instructed, Disp: 1 each, Rfl: 2 .  traZODone (DESYREL) 100 MG tablet, TAKE 1/2 (ONE-HALF) TABLET BY MOUTH AT BEDTIME, Disp: 60 tablet, Rfl: 5 .  triamcinolone cream (KENALOG) 0.1 %, Apply 1 application topically 2 (two) times daily as  needed (Eczema on legs)., Disp: 30 g, Rfl: 2  EXAM:  Vitals:   03/26/18 1307  BP: 118/60  Pulse: 85  Temp: 97.8 F (36.6 C)  SpO2: 96%    Body mass index is 32.87 kg/m.  GENERAL: vitals reviewed and listed above, alert, oriented, appears well hydrated and in no acute distress  HEENT: atraumatic, conjunttiva clear, no obvious abnormalities on inspection of external nose and ears  NECK: no obvious masses on inspection  LUNGS: prolonged exp phase with difuse scattered exp wheeze, no rhonchi or rales  CV: HRRR, tr LE peripheral edema  MS: moves all extremities without noticeable abnormality  PSYCH: pleasant and cooperative, no obvious depression or anxiety  ASSESSMENT AND PLAN:  Discussed the following assessment and plan:  Community acquired pneumonia, unspecified laterality  Wheezing - Plan: DG Chest 2 View  Fatigue, unspecified type - Plan: Basic metabolic panel, CBC with Differential/Platelet  -suspect underlying chronic lung disease, she reports ? Prior dx COPD and has required inhalers with resp illness in the past -xray, labs -pending results plan on inh vs oral steroid (she and daughter like the idea of trying inhaled), change to Levaquin pending results, prn alb -return and ER precautions -she plans to follow up with oncology and pursue CT already ordered -follow up 1 week - she reports [name] will be her PCP moving forward, reports this was already approved -Patient advised to return or notify a doctor immediately if symptoms worsen or persist or new concerns arise.  Patient Instructions  BEFORE YOU LEAVE: -labs/xray -follow up: 1 week with [name] - reports this will be her PCP now and has NPV  Further treatment pending xray/lab results.  Use the tessalon and albuterol as needed per instruction.  Seek emergency care if fevers, difficulty breathing, not responding to treatments or significant worsening or severe symptoms.     Lucretia Kern, DO

## 2018-03-27 ENCOUNTER — Telehealth: Payer: Self-pay | Admitting: Internal Medicine

## 2018-03-27 ENCOUNTER — Other Ambulatory Visit: Payer: Self-pay | Admitting: Family Medicine

## 2018-03-27 MED ORDER — BENZONATATE 100 MG PO CAPS: 100.0000 mg | ORAL_CAPSULE | Freq: Three times a day (TID) | ORAL | 0 refills | Status: DC | PRN

## 2018-03-27 MED ORDER — FLUTICASONE PROPIONATE HFA 44 MCG/ACT IN AERO: 2.0000 | INHALATION_SPRAY | Freq: Two times a day (BID) | RESPIRATORY_TRACT | 0 refills | Status: DC

## 2018-03-27 NOTE — Telephone Encounter (Signed)
Left message to return phone call.

## 2018-03-27 NOTE — Telephone Encounter (Signed)
Patient's daughter returned call regarding her mom's xray results.  Please call her back with results.  CB# [phone].

## 2018-03-27 NOTE — Telephone Encounter (Signed)
Copied from [place] [id number]. Topic: Quick Communication - See Telephone Encounter >> Mar 27, 2018 10:36 AM Sheran Luz wrote: CRM for notification. See Telephone encounter for: 03/27/18.  Pts daughter called to check status of xray results stating she is going out of town this evening and would like to know if it were possible to receive results before she leaves. Please advise.

## 2018-03-30 NOTE — Telephone Encounter (Signed)
Pt daughter notified. No further action needed at this time. Routing to PCP.

## 2018-03-30 NOTE — Telephone Encounter (Signed)
03/30/2018-See results note. Wendie Simmer    Copied from [place] [id number]. Topic: Quick Communication - See Telephone Encounter >> Mar 27, 2018 10:36 AM Sheran Luz wrote: CRM for notification. See Telephone encounter for: 03/27/18.  Pts daughter called to check status of xray results stating she is going out of town this evening and would like to know if it were possible to receive results before she leaves. Please advise.

## 2018-04-01 ENCOUNTER — Ambulatory Visit (INDEPENDENT_AMBULATORY_CARE_PROVIDER_SITE_OTHER): Payer: PPO | Admitting: Adult Health

## 2018-04-01 ENCOUNTER — Encounter: Payer: Self-pay | Admitting: Adult Health

## 2018-04-01 VITALS — BP 154/86 | HR 76 | Temp 97.6°F | Wt 190.0 lb

## 2018-04-01 DIAGNOSIS — Z23 Encounter for immunization: Secondary | ICD-10-CM

## 2018-04-01 DIAGNOSIS — R0602 Shortness of breath: Secondary | ICD-10-CM

## 2018-04-01 NOTE — Addendum Note (Signed)
Addended by: Miles Costain T on: 04/01/2018 01:45 PM   Modules accepted: Orders

## 2018-04-01 NOTE — Progress Notes (Signed)
Subjective:    Patient ID: [name], female    DOB: 04-19-1930, 84 y.o.   MRN: [id number]  HPI  84 year old female who  has a past medical history of Adenocarcinoma, colon ([place]) (Oncologist-- Dr Truitt Merle), Allergy, Anxiety, Asthma, CAD (coronary artery disease), Cataract, Chronic anemia, Chronic diastolic CHF (congestive heart failure) ([place]), Clotting disorder ([place]), Colostomy in place [facility]), Colovaginal fistula, COPD with chronic bronchitis ([place]), Depression, Dysrhythmia, Essential hypertension, GERD (gastroesophageal reflux disease), History of adenomatous polyp of colon, History of blood transfusion, History of DVT of lower extremity, History of hiatal hernia, History of TIA (transient ischemic attack), Hyperlipidemia, OA (osteoarthritis), Osteoporosis, Peripheral neuropathy, Pneumonia, Pulmonary HTN ([place]), Renal insufficiency, Sigmoid diverticulosis, Stroke ([place]), Wears dentures, and Wears glasses.  She presents to the office today for one week follow up. She was seen by Dr. Maudie Mercury on 04/04/2018   - originally seen by another provider on 03/16/2018 and diagnosed with CAP started on Doxy. She was then seen by Dr. Maudie Mercury with no improvement, symptoms included nasal congestion, cough with yellow sputum, wheezing that responded to albuterol. She had chest xray down which showed  FINDINGS: Stable enlarged cardiac and mediastinal contours with tortuosity of the thoracic aorta. No consolidative pulmonary opacities. No pleural effusion or pneumothorax. Thoracic spine degenerative changes.  She was started on Flovent by Dr. Maudie Mercury as antibiotics were not warranted at the time of most recent chest xrays.   She was supposed to have a CT of chest done - this was ordered by her oncologist. Patient reports that she was never notified about about. I will order this as there was some concern for atypical infection.   She reports that she feels slightly improved over the last week. She continues to have shortness  of breath, fatigue, and productive cough. She denies any fevers, chest pain, or n/v/d.    Review of Systems See HPI   Past Medical History:  Diagnosis Date  . Adenocarcinoma, colon [facility]) Oncologist-- Dr Truitt Merle   Multifocal (2) Colon cancer @ ileocecal valve and ascending ( mT4N1Mx), Stage IIIB, grade I, MMR normal , 2 of 16 +lymph nodes, negative surgical margins---  06-02-2014  Right hemicolectomy w/ colostomy  . Allergy   . Anxiety   . Asthma    inhaler "sometimes"  . CAD (coronary artery disease)    a. LHC 4/09: pLAD 40, mDx 70-75, pLCx 30, mLCx 50, mRCA 90, EF 60% >> PCI: BMS x2 to RCA;  b. Myoview 8/12: normal  . Cataract    bil cateracts removed  . Chronic anemia   . Chronic diastolic CHF (congestive heart failure) (HCC)    a. Echo 912 - Mild LVH, EF 55-60%, no RWMA, Gr 1 DD, mild MR, mild LAE, mild RAE, PASP 59 mmHg (mod to severe pulmo HTN)  . Clotting disorder (HCC)    hx of dvt, tia on eliquis  . Colostomy in place [facility])    s/p colostomy takedown 5/16  . Colovaginal fistula    s/p colostomy >> colostomy takedown 5/16  . COPD with chronic bronchitis ([place])   . Depression   . Dysrhythmia    A fib  . Essential hypertension   . GERD (gastroesophageal reflux disease)   . History of adenomatous polyp of colon   . History of blood transfusion   . History of DVT of lower extremity   . History of hiatal hernia   . History of TIA (transient ischemic attack)    a. Head  CT 6/15: small chronic lacunar infarct in thalamus  . Hyperlipidemia   . OA (osteoarthritis)   . Osteoporosis   . Peripheral neuropathy   . Pneumonia   . Pulmonary HTN ([place])    a. PASP on Echo in 9/12:  59 mmHg  . Renal insufficiency   . Sigmoid diverticulosis    s/p sigmoid colectomy  . Stroke ([place])    TIAs  . Wears dentures   . Wears glasses     Social History   Socioeconomic History  . Marital status: Widowed    Spouse name: Not on file  . Number of children: Not on file  . Years of  education: Not on file  . Highest education level: Not on file  Occupational History  . Not on file  Social Needs  . Financial resource strain: Not on file  . Food insecurity:    Worry: Not on file    Inability: Not on file  . Transportation needs:    Medical: Not on file    Non-medical: Not on file  Tobacco Use  . Smoking status: Former Smoker    Packs/day: 0.50    Years: 15.00    Pack years: 7.50    Types: Cigarettes    Last attempt to quit: 07/16/1967    Years since quitting: 50.7  . Smokeless tobacco: Never Used  Substance and Sexual Activity  . Alcohol use: No    Alcohol/week: 0.0 standard drinks  . Drug use: No  . Sexual activity: Not on file  Lifestyle  . Physical activity:    Days per week: Not on file    Minutes per session: Not on file  . Stress: Not on file  Relationships  . Social connections:    Talks on phone: Not on file    Gets together: Not on file    Attends religious service: Not on file    Active member of club or organization: Not on file    Attends meetings of clubs or organizations: Not on file    Relationship status: Not on file  . Intimate partner violence:    Fear of current or ex partner: Not on file    Emotionally abused: Not on file    Physically abused: Not on file    Forced sexual activity: Not on file  Other Topics Concern  . Not on file  Social History Narrative   Married, 2 children.  As of November 2015 her husband has been living in a nursing home for tendon after years as he suffers from multiple medical problems.   Patient does not drink alcohol nor does she smoke or chew tobacco products   Right handed   8th grade   1 cup daily    Past Surgical History:  Procedure Laterality Date  . ABDOMINAL HYSTERECTOMY    . ANTERIOR CERVICAL DECOMP/DISCECTOMY FUSION  01-31-2009   C5 -- 7  . APPENDECTOMY    . Bone spurs Bilateral    Feet  . CARDIOVASCULAR STRESS TEST  02-26-2011   Normal lexiscan no exercise study/  no ischemia/   normal LV function and wall motion, ef 67%  . CARPAL TUNNEL RELEASE Right   . CATARACT EXTRACTION W/ INTRAOCULAR LENS  IMPLANT, BILATERAL Bilateral   . CHOLECYSTECTOMY    . COLOSTOMY N/A 06/02/2014   Procedure: DIVERTING DESCENDING END COLOSTOMY;  Surgeon: Donnie Mesa, MD;  Location: [place];  Service: General;  Laterality: N/A;  . CORONARY ANGIOPLASTY WITH STENT PLACEMENT  11-04-2007  dr bensimhon   BMS x2 to RCA/  mild Non-obstructive disease LAD/  normal LVF  . DILATION AND CURETTAGE OF UTERUS    . ESOPHAGOGASTRODUODENOSCOPY N/A 10/28/2017   Procedure: ESOPHAGOGASTRODUODENOSCOPY (EGD);  Surgeon: Yetta Flock, MD;  Location: [facility] ENDOSCOPY;  Service: Gastroenterology;  Laterality: N/A;  . EVALUATION UNDER ANESTHESIA WITH ANAL FISTULECTOMY N/A 10/13/2014   Procedure: ANAL EXAM UNDER ANESTHESIA ;  Surgeon: [name], MD;  [facility];  Service: General;  Laterality: N/A;  . HAND SURGERY     Tendon repair  . LAPAROSCOPIC SIGMOID COLECTOMY N/A 11/24/2014   Procedure: SIGMOID COLECTOMY AND COLSOTOMY CLOSURE;  Surgeon: Donnie Mesa, MD;  Location: [place];  Service: General;  Laterality: N/A;  . LUMBAR LAMINECTOMY  10-29-2002,  1969   Left  L3 -- 4  decompression  . PARTIAL COLECTOMY N/A 06/02/2014   Procedure: RIGHT PARTIAL COLECTOMY ;  Surgeon: Donnie Mesa, MD;  Location: [place];  Service: General;  Laterality: N/A;  . PATELLECTOMY Right 09/14/2013   Procedure: PATELLECTOMY;  Surgeon: Meredith Pel, MD;  Location: [place];  Service: Orthopedics;  Laterality: Right;  . PROCTOSCOPY N/A 10/13/2014   Procedure: RIDGE PROCTOSCOPY;  Surgeon: [name], MD;  [facility];  Service: General;  Laterality: N/A;  . REVISION TOTAL KNEE ARTHROPLASTY Bilateral right  04-02-2011/  left 1996 & 10-05-1999  . ROTATOR CUFF REPAIR Bilateral   . TONSILLECTOMY    . TOTAL KNEE ARTHROPLASTY Bilateral left 1994/  right 2000  . TOTAL KNEE REVISION Left 08/02/2015   Procedure: LEFT FEMORAL  REVISION;  Surgeon: Gaynelle Arabian, MD;  [facility];  Service: Orthopedics;  Laterality: Left;  . TRANSTHORACIC ECHOCARDIOGRAM  12-01-2009   Grade I diastolic dysfunction/  ef 60%/  moderate MR/  mild TR    Family History  Problem Relation Age of Onset  . Osteoarthritis Mother   . Heart failure Mother   . Colon cancer Maternal Uncle 39  . Heart Problems Father   . Cancer Sister 44       ? colon cancer   . Kidney cancer Brother 67  . Esophageal cancer Neg Hx   . Rectal cancer Neg Hx   . Stomach cancer Neg Hx   . Pancreatic cancer Neg Hx     No Known Allergies  Current Outpatient Medications on File Prior to Visit  Medication Sig Dispense Refill  . acetaminophen (TYLENOL) 325 MG tablet Take 650 mg by mouth every 6 (six) hours as needed for moderate pain.     Marland Kitchen albuterol (PROVENTIL HFA;VENTOLIN HFA) 108 (90 Base) MCG/ACT inhaler Inhale 2 puffs into the lungs every 6 (six) hours as needed for wheezing or shortness of breath. 1 Inhaler 0  . apixaban (ELIQUIS) 5 MG TABS tablet Take 1 tablet (5 mg total) by mouth 2 (two) times daily. 180 tablet 3  . Ascorbic Acid (VITAMIN C) 1000 MG tablet Take 1,000 mg by mouth daily.    . benazepril (LOTENSIN) 40 MG tablet Take 1 tablet (40 mg total) by mouth daily. 90 tablet 4  . benzonatate (TESSALON PERLES) 100 MG capsule Take 1 capsule (100 mg total) by mouth 3 (three) times daily as needed. 20 capsule 0  . diltiazem (CARDIZEM CD) 120 MG 24 hr capsule Take 1 capsule by mouth as needed when Heart Rate is above 967 and Systolic Blood Pressure is above 100 mm/hg 90 capsule 3  . esomeprazole (NEXIUM) 40 MG capsule Take 1 capsule (40 mg total) by mouth  daily. 90 capsule 4  . ferrous sulfate 325 (65 FE) MG EC tablet Take 325 mg by mouth daily with breakfast.    . fluticasone (FLOVENT HFA) 44 MCG/ACT inhaler Inhale 2 puffs into the lungs 2 (two) times daily. 1 Inhaler 0  . furosemide (LASIX) 20 MG tablet Take 1 tablet (20 mg total) by mouth daily. 90  tablet 4  . gabapentin (NEURONTIN) 600 MG tablet Take 1 tablet (600 mg total) by mouth 2 (two) times daily. 180 tablet 3  . HYDROcodone-acetaminophen (NORCO/VICODIN) 5-325 MG tablet Take 1-2 tablets by mouth every 6 (six) hours as needed for moderate pain. 40 tablet 0  . LORazepam (ATIVAN) 0.5 MG tablet 1 tablet twice daily as needed for anxiety or sleep 60 tablet 3  . metoprolol tartrate (LOPRESSOR) 50 MG tablet Take 1.5 tablets (75 mg total) by mouth 2 (two) times daily. 270 tablet 3  . nitroGLYCERIN (NITROSTAT) 0.4 MG SL tablet Place 1 tablet (0.4 mg total) under the tongue every 5 (five) minutes as needed for chest pain (x 3 pills). Reported on 09/01/2015 25 tablet 3  . promethazine (PHENERGAN) 12.5 MG tablet Take 12.5 mg by mouth as directed.    . sertraline (ZOLOFT) 50 MG tablet Take 1 tablet (50 mg total) by mouth daily. 90 tablet 3  . Spacer/Aero-Holding Chambers (E-Z SPACER) inhaler Use as instructed 1 each 2  . traZODone (DESYREL) 100 MG tablet TAKE 1/2 (ONE-HALF) TABLET BY MOUTH AT BEDTIME 60 tablet 5  . triamcinolone cream (KENALOG) 0.1 % Apply 1 application topically 2 (two) times daily as needed (Eczema on legs). 30 g 2   No current facility-administered medications on file prior to visit.     BP (!) 154/86   Pulse 76   Temp 97.6 F (36.4 C)   Wt 190 lb (86.2 kg)   SpO2 93%   BMI 32.61 kg/m       Objective:   Physical Exam  Constitutional: She is oriented to person, place, and time. She appears well-developed and well-nourished. No distress.  HENT:  Head: Normocephalic and atraumatic.  Right Ear: External ear normal.  Left Ear: External ear normal.  Nose: Nose normal.  Mouth/Throat: Oropharynx is clear and moist. No oropharyngeal exudate.  +  PND    Neck: Normal range of motion. Neck supple.  Cardiovascular: Normal rate, regular rhythm, normal heart sounds and intact distal pulses.  Pulmonary/Chest: Effort normal and breath sounds normal.  Neurological: She is  alert and oriented to person, place, and time.  Skin: Skin is warm and dry. She is not diaphoretic.  Psychiatric: She has a normal mood and affect. Her behavior is normal. Judgment and thought content normal.  Nursing note and vitals reviewed.     Assessment & Plan:  1. Shortness of breath - Will order CT chest. Will forward this to oncologist once it results.  - Continue with inhaler use.  - She will be establishing care with me, please let me know if you need anything  - CT Chest Wo Contrast; Future  * Flu shot given   [name], NP

## 2018-04-06 ENCOUNTER — Ambulatory Visit (INDEPENDENT_AMBULATORY_CARE_PROVIDER_SITE_OTHER)
Admission: RE | Admit: 2018-04-06 | Discharge: 2018-04-06 | Disposition: A | Discharge status: Home / Self Care | Payer: PPO | Source: Ambulatory Visit | Attending: Adult Health | Admitting: Adult Health

## 2018-04-06 ENCOUNTER — Ambulatory Visit: Payer: PPO | Admitting: Cardiovascular Disease

## 2018-04-06 ENCOUNTER — Encounter: Payer: Self-pay | Admitting: Cardiovascular Disease

## 2018-04-06 VITALS — BP 144/80 | HR 91 | Ht 64.0 in | Wt 189.0 lb

## 2018-04-06 DIAGNOSIS — I5032 Chronic diastolic (congestive) heart failure: Secondary | ICD-10-CM | POA: Diagnosis not present

## 2018-04-06 DIAGNOSIS — R0609 Other forms of dyspnea: Secondary | ICD-10-CM | POA: Diagnosis not present

## 2018-04-06 DIAGNOSIS — R918 Other nonspecific abnormal finding of lung field: Secondary | ICD-10-CM | POA: Diagnosis not present

## 2018-04-06 DIAGNOSIS — R0600 Dyspnea, unspecified: Secondary | ICD-10-CM

## 2018-04-06 DIAGNOSIS — R0602 Shortness of breath: Secondary | ICD-10-CM | POA: Diagnosis not present

## 2018-04-06 DIAGNOSIS — I2510 Atherosclerotic heart disease of native coronary artery without angina pectoris: Secondary | ICD-10-CM

## 2018-04-06 MED ORDER — FUROSEMIDE 20 MG PO TABS: 40.0000 mg | ORAL_TABLET | Freq: Every day | ORAL | 3 refills | Status: DC

## 2018-04-06 NOTE — Patient Instructions (Signed)
Medication Instructions:  Your physician has recommended you make the following change in your medication:   INCREASE Furosemide (Lasix) to 40 mg (2 of your 20 mg tablets) every morning   Labwork: Your physician recommends that you return for lab work in: 2 weeks for basic metabolic panel   Testing/Procedures: None Ordered   Follow-Up: Your physician wants you to follow-up in: 6 months with Dr. Acie Fredrickson. You will receive a reminder letter in the mail two months in advance. If you don't receive a letter, please call our office to schedule the follow-up appointment.   If you need a refill on your cardiac medications before your next appointment, please call your pharmacy.   Thank you for choosing [facility]! Christen Bame, RN [phone]

## 2018-04-06 NOTE — Progress Notes (Signed)
Cardiology Office Note:    Date:  04/06/2018   ID:  [name], DOB [birth year], MRN [id number]  PCP:  [name], NP  Cardiologist:  Mertie Moores, MD  Electrophysiologist:  None   Referring MD: Marletta Lor, MD   Problem list 1.  Coronary artery disease-status post stenting of her right coronary artery in 2009 2.  Chronic diastolic congestive heart failure 3.  Mitral regurgitation 4.  Hypertension 5.  Hyperlipidemia 6.  Persistent atrial fibrillation 7.  COPD 8.  Pulmonary hypertension 9.  Hx of DVT 10.  History of colon cancer  Chief Complaint  Patient presents with  . Atrial Fibrillation     History of Present Illness:    [name] is a 84 y.o. female with a hx of coronary artery disease.  She is status post stenting of her right coronary artery x2 in 2009.  She has a history of chronic diastolic congestive heart failure.  She was admitted to the hospital in April, 2019 for acute on chronic congestive heart failure.  She was found to be anemic and had heme positive stools.  Has had pneumonia  Has had a cough for the past several weeks  Is more short of breath  Had a CT scan this am   Past Medical History:  Diagnosis Date  . Adenocarcinoma, colon [facility]) Oncologist-- Dr Truitt Merle   Multifocal (2) Colon cancer @ ileocecal valve and ascending ( [id number]), Stage IIIB, grade I, MMR normal , 2 of 16 +lymph nodes, negative surgical margins---  06-02-2014  Right hemicolectomy w/ colostomy  . Allergy   . Anxiety   . Asthma    inhaler "sometimes"  . CAD (coronary artery disease)    a. LHC 4/09: pLAD 40, mDx 70-75, pLCx 30, mLCx 50, mRCA 90, EF 60% >> PCI: BMS x2 to RCA;  b. Myoview 8/12: normal  . Cataract    bil cateracts removed  . Chronic anemia   . Chronic diastolic CHF (congestive heart failure) (HCC)    a. Echo 912 - Mild LVH, EF 55-60%, no RWMA, Gr 1 DD, mild MR, mild LAE, mild RAE, PASP 59 mmHg (mod to severe pulmo HTN)  . Clotting disorder (HCC)      hx of dvt, tia on eliquis  . Colostomy in place [facility])    s/p colostomy takedown 5/16  . Colovaginal fistula    s/p colostomy >> colostomy takedown 5/16  . COPD with chronic bronchitis ([place])   . Depression   . Dysrhythmia    A fib  . Essential hypertension   . GERD (gastroesophageal reflux disease)   . History of adenomatous polyp of colon   . History of blood transfusion   . History of DVT of lower extremity   . History of hiatal hernia   . History of TIA (transient ischemic attack)    a. Head CT 6/15: small chronic lacunar infarct in thalamus  . Hyperlipidemia   . OA (osteoarthritis)   . Osteoporosis   . Peripheral neuropathy   . Pneumonia   . Pulmonary HTN ([place])    a. PASP on Echo in 9/12:  59 mmHg  . Renal insufficiency   . Sigmoid diverticulosis    s/p sigmoid colectomy  . Stroke ([place])    TIAs  . Wears dentures   . Wears glasses     Past Surgical History:  Procedure Laterality Date  . ABDOMINAL HYSTERECTOMY    . ANTERIOR CERVICAL DECOMP/DISCECTOMY FUSION  01-31-2009  C5 -- 7  . APPENDECTOMY    . Bone spurs Bilateral    Feet  . CARDIOVASCULAR STRESS TEST  02-26-2011   Normal lexiscan no exercise study/  no ischemia/  normal LV function and wall motion, ef 67%  . CARPAL TUNNEL RELEASE Right   . CATARACT EXTRACTION W/ INTRAOCULAR LENS  IMPLANT, BILATERAL Bilateral   . CHOLECYSTECTOMY    . COLOSTOMY N/A 06/02/2014   Procedure: DIVERTING DESCENDING END COLOSTOMY;  Surgeon: Donnie Mesa, MD;  Location: [place];  Service: General;  Laterality: N/A;  . CORONARY ANGIOPLASTY WITH STENT PLACEMENT  11-04-2007  dr bensimhon   BMS x2 to RCA/  mild Non-obstructive disease LAD/  normal LVF  . DILATION AND CURETTAGE OF UTERUS    . ESOPHAGOGASTRODUODENOSCOPY N/A 10/28/2017   Procedure: ESOPHAGOGASTRODUODENOSCOPY (EGD);  Surgeon: Yetta Flock, MD;  Location: [facility]COPY;  Service: Gastroenterology;  Laterality: N/A;  . EVALUATION UNDER ANESTHESIA WITH ANAL  FISTULECTOMY N/A 10/13/2014   Procedure: ANAL EXAM UNDER ANESTHESIA ;  Surgeon: [name], MD;  Location: [facility];  Service: General;  Laterality: N/A;  . HAND SURGERY     Tendon repair  . LAPAROSCOPIC SIGMOID COLECTOMY N/A 11/24/2014   Procedure: SIGMOID COLECTOMY AND COLSOTOMY CLOSURE;  Surgeon: Donnie Mesa, MD;  Location: [place];  Service: General;  Laterality: N/A;  . LUMBAR LAMINECTOMY  10-29-2002,  1969   Left  L3 -- 4  decompression  . PARTIAL COLECTOMY N/A 06/02/2014   Procedure: RIGHT PARTIAL COLECTOMY ;  Surgeon: Donnie Mesa, MD;  Location: [place];  Service: General;  Laterality: N/A;  . PATELLECTOMY Right 09/14/2013   Procedure: PATELLECTOMY;  Surgeon: Meredith Pel, MD;  Location: [place];  Service: Orthopedics;  Laterality: Right;  . PROCTOSCOPY N/A 10/13/2014   Procedure: RIDGE PROCTOSCOPY;  Surgeon: [name], MD;  Location: [facility];  Service: General;  Laterality: N/A;  . REVISION TOTAL KNEE ARTHROPLASTY Bilateral right  04-02-2011/  left 1996 & 10-05-1999  . ROTATOR CUFF REPAIR Bilateral   . TONSILLECTOMY    . TOTAL KNEE ARTHROPLASTY Bilateral left 1994/  right 2000  . TOTAL KNEE REVISION Left 08/02/2015   Procedure: LEFT FEMORAL REVISION;  Surgeon: Gaynelle Arabian, MD;  Location: [facility];  Service: Orthopedics;  Laterality: Left;  . TRANSTHORACIC ECHOCARDIOGRAM  12-01-2009   Grade I diastolic dysfunction/  ef 60%/  moderate MR/  mild TR    Current Medications: Current Meds  Medication Sig  . acetaminophen (TYLENOL) 325 MG tablet Take 650 mg by mouth every 6 (six) hours as needed for moderate pain.   Marland Kitchen albuterol (PROVENTIL HFA;VENTOLIN HFA) 108 (90 Base) MCG/ACT inhaler Inhale 2 puffs into the lungs every 6 (six) hours as needed for wheezing or shortness of breath.  Marland Kitchen apixaban (ELIQUIS) 5 MG TABS tablet Take 1 tablet (5 mg total) by mouth 2 (two) times daily.  . Ascorbic Acid (VITAMIN C) 1000 MG tablet Take 1,000 mg by mouth daily.  . benazepril (LOTENSIN) 40 MG  tablet Take 1 tablet (40 mg total) by mouth daily.  . benzonatate (TESSALON PERLES) 100 MG capsule Take 1 capsule (100 mg total) by mouth 3 (three) times daily as needed.  . diltiazem (CARDIZEM CD) 120 MG 24 hr capsule Take 1 capsule by mouth as needed when Heart Rate is above 196 and Systolic Blood Pressure is above 100 mm/hg  . esomeprazole (NEXIUM) 40 MG capsule Take 1 capsule (40 mg total) by mouth daily.  . ferrous sulfate 325 (65 FE)  MG EC tablet Take 325 mg by mouth daily with breakfast.  . fluticasone (FLOVENT HFA) 44 MCG/ACT inhaler Inhale 2 puffs into the lungs 2 (two) times daily.  Marland Kitchen gabapentin (NEURONTIN) 600 MG tablet Take 1 tablet (600 mg total) by mouth 2 (two) times daily.  Marland Kitchen HYDROcodone-acetaminophen (NORCO/VICODIN) 5-325 MG tablet Take 1-2 tablets by mouth every 6 (six) hours as needed for moderate pain.  Marland Kitchen LORazepam (ATIVAN) 0.5 MG tablet 1 tablet twice daily as needed for anxiety or sleep  . metoprolol tartrate (LOPRESSOR) 50 MG tablet Take 1.5 tablets (75 mg total) by mouth 2 (two) times daily.  . nitroGLYCERIN (NITROSTAT) 0.4 MG SL tablet Place 1 tablet (0.4 mg total) under the tongue every 5 (five) minutes as needed for chest pain (x 3 pills). Reported on 09/01/2015  . promethazine (PHENERGAN) 12.5 MG tablet Take 12.5 mg by mouth as directed.  . sertraline (ZOLOFT) 50 MG tablet Take 1 tablet (50 mg total) by mouth daily.  Marland Kitchen Spacer/Aero-Holding Chambers (E-Z SPACER) inhaler Use as instructed  . traZODone (DESYREL) 100 MG tablet TAKE 1/2 (ONE-HALF) TABLET BY MOUTH AT BEDTIME  . triamcinolone cream (KENALOG) 0.1 % Apply 1 application topically 2 (two) times daily as needed (Eczema on legs).  . [DISCONTINUED] furosemide (LASIX) 20 MG tablet Take 1 tablet (20 mg total) by mouth daily.     Allergies:   Patient has no known allergies.   Social History   Socioeconomic History  . Marital status: Widowed    Spouse name: Not on file  . Number of children: Not on file  . Years of  education: Not on file  . Highest education level: Not on file  Occupational History  . Not on file  Social Needs  . Financial resource strain: Not on file  . Food insecurity:    Worry: Not on file    Inability: Not on file  . Transportation needs:    Medical: Not on file    Non-medical: Not on file  Tobacco Use  . Smoking status: Former Smoker    Packs/day: 0.50    Years: 15.00    Pack years: 7.50    Types: Cigarettes    Last attempt to quit: 07/16/1967    Years since quitting: 50.7  . Smokeless tobacco: Never Used  Substance and Sexual Activity  . Alcohol use: No    Alcohol/week: 0.0 standard drinks  . Drug use: No  . Sexual activity: Not on file  Lifestyle  . Physical activity:    Days per week: Not on file    Minutes per session: Not on file  . Stress: Not on file  Relationships  . Social connections:    Talks on phone: Not on file    Gets together: Not on file    Attends religious service: Not on file    Active member of club or organization: Not on file    Attends meetings of clubs or organizations: Not on file    Relationship status: Not on file  Other Topics Concern  . Not on file  Social History Narrative   Married, 2 children.  As of November 2015 her husband has been living in a nursing home for tendon after years as he suffers from multiple medical problems.   Patient does not drink alcohol nor does she smoke or chew tobacco products   Right handed   8th grade   1 cup daily     Family History: The patient's family history includes Cancer (  age of onset: 106) in her sister; Colon cancer (age of onset: 42) in her maternal uncle; Heart Problems in her father; Heart failure in her mother; Kidney cancer (age of onset: 49) in her brother; Osteoarthritis in her mother. There is no history of Esophageal cancer, Rectal cancer, Stomach cancer, or Pancreatic cancer.  ROS:   Please see the history of present illness.     All other systems reviewed and are  negative.  EKGs/Labs/Other Studies Reviewed:    The following studies were reviewed today:    Recent Labs: 08/04/2017: Pro B Natriuretic peptide (BNP) 366.0 10/26/2017: B Natriuretic Peptide 478.6 10/30/2017: Magnesium 2.1 01/19/2018: ALT 15 03/26/2018: BUN 39; Creatinine, Ser 1.30; Hemoglobin 13.8; Platelets 145.0; Potassium 4.6; Sodium 141  Recent Lipid Panel    Component Value Date/Time   CHOL 130 01/19/2013 1014   TRIG 87.0 01/19/2013 1014   HDL 33.60 (L) 01/19/2013 1014   CHOLHDL 4 01/19/2013 1014   VLDL 17.4 01/19/2013 1014   LDLCALC 79 01/19/2013 1014    Physical Exam:    VS:  BP (!) 144/80   Pulse 91   Ht '5\' 4"'$  (1.626 m)   Wt 189 lb (85.7 kg)   SpO2 95%   BMI 32.44 kg/m     Wt Readings from Last 3 Encounters:  04/06/18 189 lb (85.7 kg)  04/01/18 190 lb (86.2 kg)  03/26/18 191 lb 8 oz (86.9 kg)     GEN:  Elderly , chronically ill female  HEENT: Normal NECK: No JVD; No carotid bruits LYMPHATICS: No lymphadenopathy CARDIAC:  Irreg. Irreg.  Soft systolic murmur  RESPIRATORY:  Clear to auscultation without rales, wheezing or rhonchi  ABDOMEN: Soft, non-tender, non-distended MUSCULOSKELETAL:  No edema; No deformity  SKIN: Warm and dry NEUROLOGIC:  Alert and oriented x 3 PSYCHIATRIC:  Normal affect    EKG:     ASSESSMENT:    1. Coronary artery disease involving native coronary artery of native heart without angina pectoris   2. Chronic diastolic heart failure ([place])   3. Dyspnea on exertion    PLAN:    In order of problems listed above:  1. Dyspnea:   still eats some salt.   She has pulmonary hypertension.  Of advised her to stay away from salty foods.  We will increase her Lasix to 40 mg a day.  We will check a basic metabolic profile in 2 weeks.  2.  Coronary artery disease: She is not having any episodes of angina.   3.  Chronic diastolic congestive heart failure: We will increase Lasix to 40 mg a day.  4.  Chronic atrial fibrillation: Continue  Eliquis.  We will give her samples if we have some.  Medication Adjustments/Labs and Tests Ordered: Current medicines are reviewed at length with the patient today.  Concerns regarding medicines are outlined above.  Orders Placed This Encounter  Procedures  . Basic Metabolic Panel (BMET)   Meds ordered this encounter  Medications  . furosemide (LASIX) 20 MG tablet    Sig: Take 2 tablets (40 mg total) by mouth daily.    Dispense:  180 tablet    Refill:  3    Patient Instructions  Medication Instructions:  Your physician has recommended you make the following change in your medication:   INCREASE Furosemide (Lasix) to 40 mg (2 of your 20 mg tablets) every morning   Labwork: Your physician recommends that you return for lab work in: 2 weeks for basic metabolic panel   Testing/Procedures:  None Ordered   Follow-Up: Your physician wants you to follow-up in: 6 months with Dr. Acie Fredrickson. You will receive a reminder letter in the mail two months in advance. If you don't receive a letter, please call our office to schedule the follow-up appointment.   If you need a refill on your cardiac medications before your next appointment, please call your pharmacy.   Thank you for choosing [facility]! Christen Bame, RN [phone]       Signed, Mertie Moores, MD  04/06/2018 11:45 AM    [place]

## 2018-04-13 ENCOUNTER — Other Ambulatory Visit: Payer: Self-pay | Admitting: Adult Health

## 2018-04-13 ENCOUNTER — Telehealth: Payer: Self-pay | Admitting: Adult Health

## 2018-04-13 DIAGNOSIS — R918 Other nonspecific abnormal finding of lung field: Secondary | ICD-10-CM

## 2018-04-13 NOTE — Telephone Encounter (Signed)
Copied from [place] [id number]. Topic: Quick Communication - See Telephone Encounter >> Apr 13, 2018  4:49 PM Blase Mess A wrote: CRM for notification. See Telephone encounter for: 04/13/18. Patient had a CAT scan last week is calling for the results Please advise please call patient back [phone]

## 2018-04-16 ENCOUNTER — Inpatient Hospital Stay: Admission: RE | Admit: 2018-04-16 | Payer: PPO | Source: Ambulatory Visit

## 2018-04-17 ENCOUNTER — Other Ambulatory Visit: Payer: PPO

## 2018-04-17 NOTE — Telephone Encounter (Signed)
Patient is aware.  See result note.

## 2018-04-20 ENCOUNTER — Other Ambulatory Visit: Payer: PPO | Admitting: *Deleted

## 2018-04-20 DIAGNOSIS — R0600 Dyspnea, unspecified: Secondary | ICD-10-CM

## 2018-04-20 DIAGNOSIS — I2510 Atherosclerotic heart disease of native coronary artery without angina pectoris: Secondary | ICD-10-CM

## 2018-04-20 DIAGNOSIS — I5032 Chronic diastolic (congestive) heart failure: Secondary | ICD-10-CM | POA: Diagnosis not present

## 2018-04-20 DIAGNOSIS — R0609 Other forms of dyspnea: Secondary | ICD-10-CM | POA: Diagnosis not present

## 2018-04-20 LAB — BASIC METABOLIC PANEL
BUN/Creatinine Ratio: 31 — ABNORMAL HIGH (ref 12–28)
BUN: 41 mg/dL — ABNORMAL HIGH (ref 8–27)
CO2: 22 mmol/L (ref 20–29)
Calcium: 9.1 mg/dL (ref 8.7–10.3)
Chloride: 101 mmol/L (ref 96–106)
Creatinine, Ser: 1.33 mg/dL — ABNORMAL HIGH (ref 0.57–1.00)
GFR calc Af Amer: 42 mL/min/{1.73_m2} — ABNORMAL LOW (ref >59)
GFR calc non Af Amer: 36 mL/min/{1.73_m2} — ABNORMAL LOW (ref >59)
Glucose: 90 mg/dL (ref 65–99)
Potassium: 4.6 mmol/L (ref 3.5–5.2)
Sodium: 139 mmol/L (ref 134–144)

## 2018-04-24 ENCOUNTER — Telehealth: Payer: Self-pay | Admitting: Acute Care

## 2018-04-28 NOTE — Telephone Encounter (Signed)
Per referral notes pt is scheduled to see Dr Loanne Drilling 05/01/18. Will close this message.

## 2018-05-01 ENCOUNTER — Ambulatory Visit: Payer: PPO | Admitting: Pulmonary Disease

## 2018-05-01 ENCOUNTER — Institutional Professional Consult (permissible substitution): Payer: PPO | Admitting: Pulmonary Disease

## 2018-05-01 ENCOUNTER — Encounter: Payer: Self-pay | Admitting: Pulmonary Disease

## 2018-05-01 VITALS — BP 140/82 | HR 65 | Ht 64.0 in | Wt 191.2 lb

## 2018-05-01 DIAGNOSIS — R590 Localized enlarged lymph nodes: Secondary | ICD-10-CM

## 2018-05-01 DIAGNOSIS — R062 Wheezing: Secondary | ICD-10-CM | POA: Diagnosis not present

## 2018-05-01 DIAGNOSIS — IMO0001 Reserved for inherently not codable concepts without codable children: Secondary | ICD-10-CM

## 2018-05-01 DIAGNOSIS — J432 Centrilobular emphysema: Secondary | ICD-10-CM

## 2018-05-01 DIAGNOSIS — R0602 Shortness of breath: Secondary | ICD-10-CM

## 2018-05-01 DIAGNOSIS — R911 Solitary pulmonary nodule: Secondary | ICD-10-CM | POA: Diagnosis not present

## 2018-05-01 MED ORDER — TIOTROPIUM BROMIDE-OLODATEROL 2.5-2.5 MCG/ACT IN AERS: 2.0000 | INHALATION_SPRAY | Freq: Every day | RESPIRATORY_TRACT | 0 refills | Status: DC

## 2018-05-01 MED ORDER — TIOTROPIUM BROMIDE-OLODATEROL 2.5-2.5 MCG/ACT IN AERS: 2.0000 | INHALATION_SPRAY | Freq: Every day | RESPIRATORY_TRACT | 3 refills | Status: DC

## 2018-05-01 MED ORDER — ALBUTEROL SULFATE HFA 108 (90 BASE) MCG/ACT IN AERS: 2.0000 | INHALATION_SPRAY | Freq: Four times a day (QID) | RESPIRATORY_TRACT | 0 refills | Status: DC | PRN

## 2018-05-01 NOTE — Patient Instructions (Addendum)
Mediastinal adenopathy, borderline Right lung nodule 50mm Obtain CT Chest with contrast in 6 months  Shortness of breath Emphysema START Stiolto Respimat 2.62mcg 2 inhalation once a day  CONTINUE your Flovent 76mcg 2 puffs twice a day (total daily steroid dose 357mcg)   FOR YOUR RESCUE INHALER, use albuterol every 3-4 hours as needed for shortness of breath or wheezing.  Thank you for choosing [facility] as your health care provider! If you experience worsening shortness of breath or wheezing that does not improve, please seek medical attention immediately.

## 2018-05-01 NOTE — Progress Notes (Signed)
Synopsis: Referred in 04/2018 for lung nodule  Subjective:   PATIENT ID: [name] GENDER: female DOB: [birth year], MRN: [id number]   HPI  Chief Complaint  Patient presents with  . consult    abnormal CT - has had pneumonia x 4 in past year and CHF.     [name] is an 84 year old female with hx remote smoke (15 pack-years), asthma?, atrial fibrillation, hx of colon cancer s/p resection, CAD s/p stent, chronic diastolic heart failure who presents to [facility] as a new patient for evaluation of abnormal CT and worsening dyspnea.  Per 04/06/18 Cardiology note - patient has hx of CHF exacerbations. Diuretics were increased to worsening dyspnea  One month ago, she developed pneumonia and treated with doxycycline as outpatient. She continued to have productive cough and dyspnea so CT Chest was ordered. She reports she has had worsening dyspnea since the summer but she cannot differentiate whether symptoms are related to her heart or lungs. Breathing worsens when she is sick. She has had multiple episodes of pneumonia, 4 in the last 12 months. She know her symptoms are exacerbated by her known heart failure. She reports nocturnal paroxysmal cough, activities are moderately imit due to symptoms. Occasinal wheeze. When she uses any steroids including her flovent , she has difficulty sleeping. Diuretics improve but do not eliminate her symptoms. Uses albuterol up to twice daily. However she has tremors. Denies recent fevers, chills.  ACT score 14  Former smoker. 7.5 pack-years. Quit in 1969  I have personally reviewed patient's past medical/family/social history, allergies, current medications.  Past Medical History:  Diagnosis Date  . Adenocarcinoma, colon [facility]) Oncologist-- Dr Truitt Merle   Multifocal (2) Colon cancer @ ileocecal valve and ascending ( [id number]), Stage IIIB, grade I, MMR normal , 2 of 16 +lymph nodes, negative surgical margins---  06-02-2014  Right hemicolectomy w/  colostomy  . Allergy   . Anxiety   . Asthma    inhaler "sometimes"  . CAD (coronary artery disease)    a. LHC 4/09: pLAD 40, mDx 70-75, pLCx 30, mLCx 50, mRCA 90, EF 60% >> PCI: BMS x2 to RCA;  b. Myoview 8/12: normal  . Cataract    bil cateracts removed  . Chronic anemia   . Chronic diastolic CHF (congestive heart failure) (HCC)    a. Echo 912 - Mild LVH, EF 55-60%, no RWMA, Gr 1 DD, mild MR, mild LAE, mild RAE, PASP 59 mmHg (mod to severe pulmo HTN)  . Clotting disorder (HCC)    hx of dvt, tia on eliquis  . Colostomy in place [facility])    s/p colostomy takedown 5/16  . Colovaginal fistula    s/p colostomy >> colostomy takedown 5/16  . COPD with chronic bronchitis ([place])   . Depression   . Dysrhythmia    A fib  . Essential hypertension   . GERD (gastroesophageal reflux disease)   . History of adenomatous polyp of colon   . History of blood transfusion   . History of DVT of lower extremity   . History of hiatal hernia   . History of TIA (transient ischemic attack)    a. Head CT 6/15: small chronic lacunar infarct in thalamus  . Hyperlipidemia   . OA (osteoarthritis)   . Osteoporosis   . Peripheral neuropathy   . Pneumonia   . Pulmonary HTN ([place])    a. PASP on Echo in 9/12:  59 mmHg  . Renal insufficiency   .  Sigmoid diverticulosis    s/p sigmoid colectomy  . Stroke ([place])    TIAs  . Wears dentures   . Wears glasses      Family History  Problem Relation Age of Onset  . Osteoarthritis Mother   . Heart failure Mother   . Colon cancer Maternal Uncle 75  . Heart Problems Father   . Cancer Sister 84       ? colon cancer   . Kidney cancer Brother 45  . Esophageal cancer Neg Hx   . Rectal cancer Neg Hx   . Stomach cancer Neg Hx   . Pancreatic cancer Neg Hx      Social History   Socioeconomic History  . Marital status: Widowed    Spouse name: Not on file  . Number of children: Not on file  . Years of education: Not on file  . Highest education level: Not on file    Occupational History  . Not on file  Social Needs  . Financial resource strain: Not on file  . Food insecurity:    Worry: Not on file    Inability: Not on file  . Transportation needs:    Medical: Not on file    Non-medical: Not on file  Tobacco Use  . Smoking status: Former Smoker    Packs/day: 0.50    Years: 15.00    Pack years: 7.50    Types: Cigarettes    Last attempt to quit: 07/16/1967    Years since quitting: 50.8  . Smokeless tobacco: Never Used  Substance and Sexual Activity  . Alcohol use: No    Alcohol/week: 0.0 standard drinks  . Drug use: No  . Sexual activity: Not on file  Lifestyle  . Physical activity:    Days per week: Not on file    Minutes per session: Not on file  . Stress: Not on file  Relationships  . Social connections:    Talks on phone: Not on file    Gets together: Not on file    Attends religious service: Not on file    Active member of club or organization: Not on file    Attends meetings of clubs or organizations: Not on file    Relationship status: Not on file  . Intimate partner violence:    Fear of current or ex partner: Not on file    Emotionally abused: Not on file    Physically abused: Not on file    Forced sexual activity: Not on file  Other Topics Concern  . Not on file  Social History Narrative   Married, 2 children.  As of November 2015 her husband has been living in a nursing home for tendon after years as he suffers from multiple medical problems.   Patient does not drink alcohol nor does she smoke or chew tobacco products   Right handed   8th grade   1 cup daily     No Known Allergies   Outpatient Medications Prior to Visit  Medication Sig Dispense Refill  . acetaminophen (TYLENOL) 325 MG tablet Take 650 mg by mouth every 6 (six) hours as needed for moderate pain.     Marland Kitchen apixaban (ELIQUIS) 5 MG TABS tablet Take 1 tablet (5 mg total) by mouth 2 (two) times daily. 180 tablet 3  . Ascorbic Acid (VITAMIN C) 1000 MG tablet  Take 1,000 mg by mouth daily.    . benazepril (LOTENSIN) 40 MG tablet Take 1 tablet (40 mg total) by  mouth daily. 90 tablet 4  . benzonatate (TESSALON PERLES) 100 MG capsule Take 1 capsule (100 mg total) by mouth 3 (three) times daily as needed. 20 capsule 0  . diltiazem (CARDIZEM CD) 120 MG 24 hr capsule Take 1 capsule by mouth as needed when Heart Rate is above 301 and Systolic Blood Pressure is above 100 mm/hg 90 capsule 3  . esomeprazole (NEXIUM) 40 MG capsule Take 1 capsule (40 mg total) by mouth daily. 90 capsule 4  . ferrous sulfate 325 (65 FE) MG EC tablet Take 325 mg by mouth daily with breakfast.    . fluticasone (FLOVENT HFA) 44 MCG/ACT inhaler Inhale 2 puffs into the lungs 2 (two) times daily. 1 Inhaler 0  . furosemide (LASIX) 20 MG tablet Take 2 tablets (40 mg total) by mouth daily. 180 tablet 3  . gabapentin (NEURONTIN) 600 MG tablet Take 1 tablet (600 mg total) by mouth 2 (two) times daily. 180 tablet 3  . HYDROcodone-acetaminophen (NORCO/VICODIN) 5-325 MG tablet Take 1-2 tablets by mouth every 6 (six) hours as needed for moderate pain. 40 tablet 0  . LORazepam (ATIVAN) 0.5 MG tablet 1 tablet twice daily as needed for anxiety or sleep 60 tablet 3  . metoprolol tartrate (LOPRESSOR) 50 MG tablet Take 1.5 tablets (75 mg total) by mouth 2 (two) times daily. 270 tablet 3  . nitroGLYCERIN (NITROSTAT) 0.4 MG SL tablet Place 1 tablet (0.4 mg total) under the tongue every 5 (five) minutes as needed for chest pain (x 3 pills). Reported on 09/01/2015 25 tablet 3  . promethazine (PHENERGAN) 12.5 MG tablet Take 12.5 mg by mouth as directed.    . sertraline (ZOLOFT) 50 MG tablet Take 1 tablet (50 mg total) by mouth daily. 90 tablet 3  . Spacer/Aero-Holding Chambers (E-Z SPACER) inhaler Use as instructed 1 each 2  . traZODone (DESYREL) 100 MG tablet TAKE 1/2 (ONE-HALF) TABLET BY MOUTH AT BEDTIME 60 tablet 5  . triamcinolone cream (KENALOG) 0.1 % Apply 1 application topically 2 (two) times daily as  needed (Eczema on legs). 30 g 2  . albuterol (PROVENTIL HFA;VENTOLIN HFA) 108 (90 Base) MCG/ACT inhaler Inhale 2 puffs into the lungs every 6 (six) hours as needed for wheezing or shortness of breath. 1 Inhaler 0   No facility-administered medications prior to visit.     Review of Systems  Constitutional: Negative for chills, diaphoresis, fever, malaise/fatigue and weight loss.  HENT: Negative for congestion and sore throat.   Respiratory: Positive for cough, sputum production, shortness of breath and wheezing. Negative for hemoptysis.   Cardiovascular: Positive for PND. Negative for chest pain, orthopnea and leg swelling.  Gastrointestinal: Negative for abdominal pain, heartburn and nausea.  Genitourinary: Negative for frequency.  Musculoskeletal: Negative for myalgias.  Skin: Negative for rash.  Neurological: Positive for tremors. Negative for dizziness, weakness and headaches.  Endo/Heme/Allergies: Does not bruise/bleed easily.  All other systems reviewed and are negative.     Objective:  Physical Exam  Constitutional: She is oriented to person, place, and time. She appears well-nourished. No distress.  HENT:  Head: Normocephalic and atraumatic.  Nose: Nose normal.  Mouth/Throat: Oropharynx is clear and moist. No oropharyngeal exudate.  Eyes: Conjunctivae and EOM are normal. No scleral icterus.  Neck: Normal range of motion. No JVD present. No tracheal deviation present.  Cardiovascular: Intact distal pulses. Exam reveals gallop. Exam reveals no friction rub.  No murmur heard. Irregularly irregular heart rate  Pulmonary/Chest: Effort normal and breath sounds normal. No stridor.  No respiratory distress. She has no wheezes.  Abdominal: Soft. Bowel sounds are normal. She exhibits no distension. There is no tenderness.  Musculoskeletal: She exhibits edema (nonpitting lower extremity).  Lymphadenopathy:    She has no cervical adenopathy.  Neurological: She is alert and oriented  to person, place, and time. No cranial nerve deficit.  Skin: Skin is warm and dry. No erythema.  Psychiatric: She has a normal mood and affect. Her behavior is normal.  Vitals reviewed.    Vitals:   05/01/18 1437  BP: 140/82  Pulse: 65  SpO2: 95%  Weight: 191 lb 3.2 oz (86.7 kg)  Height: '5\' 4"'$  (1.626 m)    CBC    Component Value Date/Time   WBC 8.1 03/26/2018 1345   RBC 4.30 03/26/2018 1345   HGB 13.8 03/26/2018 1345   HGB 13.7 01/19/2018 1100   HGB 11.7 08/04/2017 1610   HGB 11.7 02/17/2017 0932   HCT 41.4 03/26/2018 1345   HCT 37.4 08/04/2017 1610   HCT 39.0 02/17/2017 0932   PLT 145.0 (L) 03/26/2018 1345   PLT 128 (L) 01/19/2018 1100   PLT 184 08/04/2017 1610   MCV 96.3 03/26/2018 1345   MCV 87 08/04/2017 1610   MCV 84.1 02/17/2017 0932   MCH 29.9 01/19/2018 1100   MCHC 33.2 03/26/2018 1345   RDW 14.2 03/26/2018 1345   RDW 14.6 08/04/2017 1610   RDW 22.9 (H) 02/17/2017 0932   LYMPHSABS 1.5 03/26/2018 1345   LYMPHSABS 1.6 02/17/2017 0932   MONOABS 0.7 03/26/2018 1345   MONOABS 0.7 02/17/2017 0932   EOSABS 0.1 03/26/2018 1345   EOSABS 0.1 02/17/2017 0932   BASOSABS 0.1 03/26/2018 1345   BASOSABS 0.0 02/17/2017 0932     Chest imaging: CT Chest WO Contrast 04/06/18  IMPRESSION: 1. Less patchy opacity in the lower lobes compared to most recent CT. Areas of mosaic attenuation in the lower lobes currently is likely indicative of chronic small airways obstruction and atelectasis. No frank consolidation. There are areas of more typical appearing atelectatic change bilaterally. There is a stable 2 mm nodular opacity in the right lower lobe. No pleural effusion evident. There is mild underlying centrilobular emphysematous change.  2. Mildly prominent lymph node anterior to the carina, likely of reactive etiology. No progression of adenopathy.  3. Aortic atherosclerosis. Foci of great vessel and coronary artery calcification.  4.  Moderate hiatal  hernia.  5.  Gallbladder absent.  PFT: None on file  Echo: TTE 10/28/17 Study Conclusions  - Left ventricle: The cavity size was normal. There was severe   focal basal and moderate concentric hypertrophy. Systolic   function was normal. The estimated ejection fraction was in the   range of 55% to 60%. Wall motion was normal; there were no   regional wall motion abnormalities. The study was not technically   sufficient to allow evaluation of LV diastolic dysfunction due to   atrial fibrillation. - Aortic valve: Mildly to moderately calcified annulus. Trileaflet;   normal thickness, mildly calcified leaflets. - Mitral valve: There was moderate regurgitation. - Left atrium: The atrium was severely dilated. - Tricuspid valve: There was moderate regurgitation. - Pulmonic valve: There was trivial regurgitation. - Pulmonary arteries: PA peak pressure: 50 mm Hg (S).  Impressions:  - The right ventricular systolic pressure was increased consistent   with moderate pulmonary hypertension.  I have personally reviewed the above labs, images and tests noted above.    Assessment & Plan:   Nodule of lower  lobe of left lung  Lung nodule < 6cm on CT  Centrilobular emphysema (HCC)  Shortness of breath - Plan: CT Chest W Contrast  Wheezing - Plan: albuterol (PROVENTIL HFA;VENTOLIN HFA) 108 (90 Base) MCG/ACT inhaler  Discussion: 84 year old female with hx of colon cancer s/p resection Mild mediastinal adenopathy since 07/2016 that is unchanged. We discussed the benefit vs risk of continued surveillance vs invasive diagnostic testing of the mediastinal lymph nodes and after shared decision making, patient wishes to be conservative and continue surveillance imaging.  Dyspnea is likely multifactorial in setting of known chronic heart failure, moderate PH on echo and possible obstructive lung disease. Patient declined testing including pulmonary function tests. Treatment of these conditions  would be diuretics and bronchodilator therapy so further diagnostic testing would not add to treatment plan. I will add LAMA/LABA.  Mediastinal adenopathy, borderline, stable compared to 10/26/17 Right lung nodule 51m I spent greater than 50% of the visit counseling on risks and benefits of continued surveillance vs diagnostic testing with bronchoscopy of enlarged lymph nodes. After shared decision making, patient opted for conservative management with surveillance imaging.  Ordered CT Chest with contrast in 6 months  Shortness of breath Emphysema START Stiolto Respimat 2.51m 2 inhalation once a day  CONTINUE your Flovent 4435m2 puffs twice a day (total daily steroid dose 352m33m  FOR YOUR RESCUE INHALER, use albuterol every 3-4 hours as needed for shortness of breath or wheezing.   Vaccination Influenza vaccine UTD 04/01/18  Thank you for choosing [facility] as your health care provider! If you experience worsening shortness of breath or wheezing that does not improve, please seek medical attention immediately.   Whitney Biskup JaneRodman Pickle [facility] 05/01/2018 3:46 PM  Personal pager: #[phone], please page CCM On-call: #[phone] Outpatient Medications:  .  acetaminophen (TYLENOL) 325 MG tablet, Take 650 mg by mouth every 6 (six) hours as needed for moderate pain. , Disp: , Rfl:  .  albuterol (PROVENTIL HFA;VENTOLIN HFA) 108 (90 Base) MCG/ACT inhaler, Inhale 2 puffs into the lungs every 6 (six) hours as needed for wheezing or shortness of breath., Disp: 1 Inhaler, Rfl: 0 .  apixaban (ELIQUIS) 5 MG TABS tablet, Take 1 tablet (5 mg total) by mouth 2 (two) times daily., Disp: 180 tablet, Rfl: 3 .  Ascorbic Acid (VITAMIN C) 1000 MG tablet, Take 1,000 mg by mouth daily., Disp: , Rfl:  .  benazepril (LOTENSIN) 40 MG tablet, Take 1 tablet (40 mg total) by mouth daily., Disp: 90 tablet, Rfl: 4 .  benzonatate (TESSALON PERLES) 100 MG  capsule, Take 1 capsule (100 mg total) by mouth 3 (three) times daily as needed., Disp: 20 capsule, Rfl: 0 .  diltiazem (CARDIZEM CD) 120 MG 24 hr capsule, Take 1 capsule by mouth as needed when Heart Rate is above 120 700 Systolic Blood Pressure is above 100 mm/hg, Disp: 90 capsule, Rfl: 3 .  esomeprazole (NEXIUM) 40 MG capsule, Take 1 capsule (40 mg total) by mouth daily., Disp: 90 capsule, Rfl: 4 .  ferrous sulfate 325 (65 FE) MG EC tablet, Take 325 mg by mouth daily with breakfast., Disp: , Rfl:  .  fluticasone (FLOVENT HFA) 44 MCG/ACT inhaler, Inhale 2 puffs into the lungs 2 (two) times daily., Disp: 1 Inhaler, Rfl: 0 .  furosemide (LASIX) 20 MG tablet, Take 2 tablets (40 mg total) by mouth daily., Disp: 180 tablet, Rfl: 3 .  gabapentin (NEURONTIN) 600 MG  tablet, Take 1 tablet (600 mg total) by mouth 2 (two) times daily., Disp: 180 tablet, Rfl: 3 .  HYDROcodone-acetaminophen (NORCO/VICODIN) 5-325 MG tablet, Take 1-2 tablets by mouth every 6 (six) hours as needed for moderate pain., Disp: 40 tablet, Rfl: 0 .  LORazepam (ATIVAN) 0.5 MG tablet, 1 tablet twice daily as needed for anxiety or sleep, Disp: 60 tablet, Rfl: 3 .  metoprolol tartrate (LOPRESSOR) 50 MG tablet, Take 1.5 tablets (75 mg total) by mouth 2 (two) times daily., Disp: 270 tablet, Rfl: 3 .  nitroGLYCERIN (NITROSTAT) 0.4 MG SL tablet, Place 1 tablet (0.4 mg total) under the tongue every 5 (five) minutes as needed for chest pain (x 3 pills). Reported on 09/01/2015, Disp: 25 tablet, Rfl: 3 .  promethazine (PHENERGAN) 12.5 MG tablet, Take 12.5 mg by mouth as directed., Disp: , Rfl:  .  sertraline (ZOLOFT) 50 MG tablet, Take 1 tablet (50 mg total) by mouth daily., Disp: 90 tablet, Rfl: 3 .  Spacer/Aero-Holding Chambers (E-Z SPACER) inhaler, Use as instructed, Disp: 1 each, Rfl: 2 .  traZODone (DESYREL) 100 MG tablet, TAKE 1/2 (ONE-HALF) TABLET BY MOUTH AT BEDTIME, Disp: 60 tablet, Rfl: 5 .  triamcinolone cream (KENALOG) 0.1 %, Apply 1  application topically 2 (two) times daily as needed (Eczema on legs)., Disp: 30 g, Rfl: 2 .  Tiotropium Bromide-Olodaterol (STIOLTO RESPIMAT) 2.5-2.5 MCG/ACT AERS, Inhale 2 puffs into the lungs daily., Disp: 1 Inhaler, Rfl: 3

## 2018-05-27 DIAGNOSIS — H353131 Nonexudative age-related macular degeneration, bilateral, early dry stage: Secondary | ICD-10-CM | POA: Diagnosis not present

## 2018-05-27 DIAGNOSIS — Z961 Presence of intraocular lens: Secondary | ICD-10-CM | POA: Diagnosis not present

## 2018-05-27 DIAGNOSIS — H04123 Dry eye syndrome of bilateral lacrimal glands: Secondary | ICD-10-CM | POA: Diagnosis not present

## 2018-06-16 ENCOUNTER — Ambulatory Visit (INDEPENDENT_AMBULATORY_CARE_PROVIDER_SITE_OTHER): Payer: PPO

## 2018-06-16 ENCOUNTER — Ambulatory Visit (INDEPENDENT_AMBULATORY_CARE_PROVIDER_SITE_OTHER): Payer: PPO | Admitting: Family Medicine

## 2018-06-16 ENCOUNTER — Encounter: Payer: Self-pay | Admitting: Family Medicine

## 2018-06-16 VITALS — BP 126/84 | HR 80 | Temp 98.0°F | Resp 12 | Ht 64.0 in | Wt 190.4 lb

## 2018-06-16 DIAGNOSIS — S99911A Unspecified injury of right ankle, initial encounter: Secondary | ICD-10-CM

## 2018-06-16 DIAGNOSIS — S99921A Unspecified injury of right foot, initial encounter: Secondary | ICD-10-CM

## 2018-06-16 DIAGNOSIS — Z7901 Long term (current) use of anticoagulants: Secondary | ICD-10-CM | POA: Diagnosis not present

## 2018-06-16 DIAGNOSIS — R600 Localized edema: Secondary | ICD-10-CM | POA: Diagnosis not present

## 2018-06-16 MED ORDER — HYDROCODONE-ACETAMINOPHEN 5-325 MG PO TABS: 1.0000 | ORAL_TABLET | Freq: Three times a day (TID) | ORAL | 0 refills | Status: AC | PRN

## 2018-06-16 NOTE — Patient Instructions (Signed)
A few things to remember from today's visit:   Injury of right foot, initial encounter - Plan: DG Foot Complete Right, HYDROcodone-acetaminophen (NORCO/VICODIN) 5-325 MG tablet  Injury of right ankle, initial encounter - Plan: DG Ankle Complete Right, HYDROcodone-acetaminophen (NORCO/VICODIN) 5-325 MG tablet  Elevation,local ice,and hard shoe wear [place] help.   Please be sure medication list is accurate. If a new problem present, please set up appointment sooner than planned today.

## 2018-06-16 NOTE — Progress Notes (Signed)
ACUTE VISIT   HPI:  Chief Complaint  Patient presents with  . Right foot pain    dropped a trash can on right foot 1 week ago    [name] is a 84 y.o. female, who is here today with her daughter complaining of severe, shooting pain on right foot that started right after she dropped a plastic trash can on foot a week ago. She did not seek medical attention until now.  + Edema. Limitation of ankle ROM. Pain is "bad",ecchymosis noted a few minutes after trauma.  Pain is constant, exacerbated by movement and palpation, alleviated by rest. Denies numbness or tingling.  She has taken Tylenol.  It seems to be slowly getting better.  She is on Eliquis for atrial fib. She has not noted blood in stool or gross hematuria.   Review of Systems  Constitutional: Negative for chills and fever.  Genitourinary: Negative for decreased urine volume and hematuria.  Musculoskeletal: Positive for arthralgias, gait problem and joint swelling.  Skin: Negative for pallor and wound.  Neurological: Negative for weakness and numbness.  Hematological: Bruises/bleeds easily.    Current Outpatient Medications on File Prior to Visit  Medication Sig Dispense Refill  . acetaminophen (TYLENOL) 325 MG tablet Take 650 mg by mouth every 6 (six) hours as needed for moderate pain.     Marland Kitchen albuterol (PROVENTIL HFA;VENTOLIN HFA) 108 (90 Base) MCG/ACT inhaler Inhale 2 puffs into the lungs every 6 (six) hours as needed for wheezing or shortness of breath. 1 Inhaler 0  . apixaban (ELIQUIS) 5 MG TABS tablet Take 1 tablet (5 mg total) by mouth 2 (two) times daily. 180 tablet 3  . Ascorbic Acid (VITAMIN C) 1000 MG tablet Take 1,000 mg by mouth daily.    . benazepril (LOTENSIN) 40 MG tablet Take 1 tablet (40 mg total) by mouth daily. 90 tablet 4  . benzonatate (TESSALON PERLES) 100 MG capsule Take 1 capsule (100 mg total) by mouth 3 (three) times daily as needed. 20 capsule 0  . diltiazem (CARDIZEM CD)  120 MG 24 hr capsule Take 1 capsule by mouth as needed when Heart Rate is above 976 and Systolic Blood Pressure is above 100 mm/hg 90 capsule 3  . esomeprazole (NEXIUM) 40 MG capsule Take 1 capsule (40 mg total) by mouth daily. 90 capsule 4  . ferrous sulfate 325 (65 FE) MG EC tablet Take 325 mg by mouth daily with breakfast.    . fluticasone (FLOVENT HFA) 44 MCG/ACT inhaler Inhale 2 puffs into the lungs 2 (two) times daily. 1 Inhaler 0  . furosemide (LASIX) 20 MG tablet Take 2 tablets (40 mg total) by mouth daily. 180 tablet 3  . gabapentin (NEURONTIN) 600 MG tablet Take 1 tablet (600 mg total) by mouth 2 (two) times daily. 180 tablet 3  . HYDROcodone-acetaminophen (NORCO/VICODIN) 5-325 MG tablet Take 1-2 tablets by mouth every 6 (six) hours as needed for moderate pain. 40 tablet 0  . LORazepam (ATIVAN) 0.5 MG tablet 1 tablet twice daily as needed for anxiety or sleep 60 tablet 3  . nitroGLYCERIN (NITROSTAT) 0.4 MG SL tablet Place 1 tablet (0.4 mg total) under the tongue every 5 (five) minutes as needed for chest pain (x 3 pills). Reported on 09/01/2015 25 tablet 3  . promethazine (PHENERGAN) 12.5 MG tablet Take 12.5 mg by mouth as directed.    . sertraline (ZOLOFT) 50 MG tablet Take 1 tablet (50 mg total) by mouth daily. 90 tablet  3  . Spacer/Aero-Holding Chambers (E-Z SPACER) inhaler Use as instructed 1 each 2  . Tiotropium Bromide-Olodaterol (STIOLTO RESPIMAT) 2.5-2.5 MCG/ACT AERS Inhale 2 puffs into the lungs daily. 1 Inhaler 3  . Tiotropium Bromide-Olodaterol (STIOLTO RESPIMAT) 2.5-2.5 MCG/ACT AERS Inhale 2 puffs into the lungs daily. 2 Inhaler 0  . traZODone (DESYREL) 100 MG tablet TAKE 1/2 (ONE-HALF) TABLET BY MOUTH AT BEDTIME 60 tablet 5  . triamcinolone cream (KENALOG) 0.1 % Apply 1 application topically 2 (two) times daily as needed (Eczema on legs). 30 g 2  . metoprolol tartrate (LOPRESSOR) 50 MG tablet Take 1.5 tablets (75 mg total) by mouth 2 (two) times daily. 270 tablet 3   No  current facility-administered medications on file prior to visit.      Past Medical History:  Diagnosis Date  . Adenocarcinoma, colon [facility]) Oncologist-- Dr Truitt Merle   Multifocal (2) Colon cancer @ ileocecal valve and ascending ( [id number]), Stage IIIB, grade I, MMR normal , 2 of 16 +lymph nodes, negative surgical margins---  06-02-2014  Right hemicolectomy w/ colostomy  . Allergy   . Anxiety   . Asthma    inhaler "sometimes"  . CAD (coronary artery disease)    a. LHC 4/09: pLAD 40, mDx 70-75, pLCx 30, mLCx 50, mRCA 90, EF 60% >> PCI: BMS x2 to RCA;  b. Myoview 8/12: normal  . Cataract    bil cateracts removed  . Chronic anemia   . Chronic diastolic CHF (congestive heart failure) (HCC)    a. Echo 912 - Mild LVH, EF 55-60%, no RWMA, Gr 1 DD, mild MR, mild LAE, mild RAE, PASP 59 mmHg (mod to severe pulmo HTN)  . Clotting disorder (HCC)    hx of dvt, tia on eliquis  . Colostomy in place [facility])    s/p colostomy takedown 5/16  . Colovaginal fistula    s/p colostomy >> colostomy takedown 5/16  . COPD with chronic bronchitis ([place])   . Depression   . Dysrhythmia    A fib  . Essential hypertension   . GERD (gastroesophageal reflux disease)   . History of adenomatous polyp of colon   . History of blood transfusion   . History of DVT of lower extremity   . History of hiatal hernia   . History of TIA (transient ischemic attack)    a. Head CT 6/15: small chronic lacunar infarct in thalamus  . Hyperlipidemia   . OA (osteoarthritis)   . Osteoporosis   . Peripheral neuropathy   . Pneumonia   . Pulmonary HTN ([place])    a. PASP on Echo in 9/12:  59 mmHg  . Renal insufficiency   . Sigmoid diverticulosis    s/p sigmoid colectomy  . Stroke ([place])    TIAs  . Wears dentures   . Wears glasses    No Known Allergies  Social History   Socioeconomic History  . Marital status: Widowed    Spouse name: Not on file  . Number of children: Not on file  . Years of education: Not on file  . Highest  education level: Not on file  Occupational History  . Not on file  Social Needs  . Financial resource strain: Not on file  . Food insecurity:    Worry: Not on file    Inability: Not on file  . Transportation needs:    Medical: Not on file    Non-medical: Not on file  Tobacco Use  . Smoking status: Former Smoker  Packs/day: 0.50    Years: 15.00    Pack years: 7.50    Types: Cigarettes    Last attempt to quit: 07/16/1967    Years since quitting: 50.9  . Smokeless tobacco: Never Used  Substance and Sexual Activity  . Alcohol use: No    Alcohol/week: 0.0 standard drinks  . Drug use: No  . Sexual activity: Not on file  Lifestyle  . Physical activity:    Days per week: Not on file    Minutes per session: Not on file  . Stress: Not on file  Relationships  . Social connections:    Talks on phone: Not on file    Gets together: Not on file    Attends religious service: Not on file    Active member of club or organization: Not on file    Attends meetings of clubs or organizations: Not on file    Relationship status: Not on file  Other Topics Concern  . Not on file  Social History Narrative   Married, 2 children.  As of November 2015 her husband has been living in a nursing home for tendon after years as he suffers from multiple medical problems.   Patient does not drink alcohol nor does she smoke or chew tobacco products   Right handed   8th grade   1 cup daily    Vitals:   06/16/18 1457  BP: 126/84  Pulse: 80  Resp: 12  Temp: 98 F (36.7 C)  SpO2: 100%   Body mass index is 32.68 kg/m.   Physical Exam  Nursing note and vitals reviewed. Constitutional: She is oriented to person, place, and time. She appears well-developed. She does not appear ill. No distress.  HENT:  Head: Normocephalic and atraumatic.  Eyes: Conjunctivae are normal.  Cardiovascular: Normal rate. An irregular rhythm present.  No murmur heard. Pulses:      Dorsalis pedis pulses are 2+ on the  right side, and 2+ on the left side.  Right DP and PT present.  Respiratory: Effort normal. No respiratory distress.  Musculoskeletal: She exhibits no edema.       Right ankle: She exhibits decreased range of motion, swelling and ecchymosis. She exhibits no laceration and normal pulse. Tenderness. Lateral malleolus tenderness found. No medial malleolus and no proximal fibula tenderness found.  Neurological: She is alert and oriented to person, place, and time. She has normal strength.  Unstable,antalgic gait assisted with a walker.  Skin: Skin is warm. Ecchymosis noted. No laceration and no rash noted. There is erythema.  Psychiatric: Her mood appears anxious.  Well groomed, good eye contact.        ASSESSMENT AND PLAN:   [name] was seen today for right foot pain.  Diagnoses and all orders for this visit:  Injury of right foot, initial encounter -     DG Foot Complete Right; Future -     HYDROcodone-acetaminophen (NORCO/VICODIN) 5-325 MG tablet; Take 1 tablet by mouth every 8 (eight) hours as needed for up to 5 days for moderate pain. -     DG Foot Complete Right  Injury of right ankle, initial encounter -     DG Ankle Complete Right; Future -     HYDROcodone-acetaminophen (NORCO/VICODIN) 5-325 MG tablet; Take 1 tablet by mouth every 8 (eight) hours as needed for up to 5 days for moderate pain. -     DG Ankle Complete Right  Chronic anticoagulation  I will review imaging myself and I  do not appreciate acute process/fracture. There are degenerative changes and low bone density.  Pending formal report.  Recommend LE elevation. She has taken Hydrocodone-Acetaminophen in the past , some side effects discussed.   -Side effects of Eliquis discussed. Ecchymosis gradually improving, peripheral pulses present and normal capillary refill. Instructed about warning signs.    Return if symptoms worsen or fail to improve.         Marchell Froman G. [place], MD  [facility]. [place] office.

## 2018-06-21 ENCOUNTER — Encounter: Payer: Self-pay | Admitting: Family Medicine

## 2018-06-23 ENCOUNTER — Encounter: Payer: Self-pay | Admitting: Adult Health

## 2018-06-23 ENCOUNTER — Ambulatory Visit (INDEPENDENT_AMBULATORY_CARE_PROVIDER_SITE_OTHER): Payer: PPO | Admitting: Adult Health

## 2018-06-23 VITALS — BP 154/90 | Temp 98.2°F | Wt 192.0 lb

## 2018-06-23 DIAGNOSIS — F329 Major depressive disorder, single episode, unspecified: Secondary | ICD-10-CM | POA: Diagnosis not present

## 2018-06-23 DIAGNOSIS — I482 Chronic atrial fibrillation, unspecified: Secondary | ICD-10-CM | POA: Diagnosis not present

## 2018-06-23 DIAGNOSIS — F419 Anxiety disorder, unspecified: Secondary | ICD-10-CM

## 2018-06-23 DIAGNOSIS — Z7689 Persons encountering health services in other specified circumstances: Secondary | ICD-10-CM | POA: Diagnosis not present

## 2018-06-23 DIAGNOSIS — Z7901 Long term (current) use of anticoagulants: Secondary | ICD-10-CM

## 2018-06-23 DIAGNOSIS — I25118 Atherosclerotic heart disease of native coronary artery with other forms of angina pectoris: Secondary | ICD-10-CM

## 2018-06-23 DIAGNOSIS — F32A Depression, unspecified: Secondary | ICD-10-CM

## 2018-06-23 DIAGNOSIS — L03115 Cellulitis of right lower limb: Secondary | ICD-10-CM | POA: Diagnosis not present

## 2018-06-23 DIAGNOSIS — F5101 Primary insomnia: Secondary | ICD-10-CM

## 2018-06-23 DIAGNOSIS — J439 Emphysema, unspecified: Secondary | ICD-10-CM | POA: Diagnosis not present

## 2018-06-23 DIAGNOSIS — I5032 Chronic diastolic (congestive) heart failure: Secondary | ICD-10-CM | POA: Diagnosis not present

## 2018-06-23 MED ORDER — DOXYCYCLINE HYCLATE 100 MG PO CAPS: 100.0000 mg | ORAL_CAPSULE | Freq: Two times a day (BID) | ORAL | 0 refills | Status: DC

## 2018-06-23 NOTE — Patient Instructions (Signed)
It was great seeing you today   I am going to prescribe you doxycyline for a possible skin infection in your right foot.   Please follow up next week

## 2018-06-23 NOTE — Progress Notes (Signed)
Patient presents to clinic today to establish care. He is a pleasant 84 year old female who  has a past medical history of Adenocarcinoma, colon ([place]) (Oncologist-- Dr Truitt Merle), Allergy, Anxiety, Asthma, CAD (coronary artery disease), Cataract, Chronic anemia, Chronic diastolic CHF (congestive heart failure) ([place]), Clotting disorder ([place]), Colostomy in place [facility]), Colovaginal fistula, COPD with chronic bronchitis ([place]), Depression, Dysrhythmia, Essential hypertension, GERD (gastroesophageal reflux disease), History of adenomatous polyp of colon, History of blood transfusion, History of DVT of lower extremity, History of hiatal hernia, History of TIA (transient ischemic attack), Hyperlipidemia, OA (osteoarthritis), Osteoporosis, Peripheral neuropathy, Pneumonia, Pulmonary HTN ([place]), Renal insufficiency, Sigmoid diverticulosis, Stroke ([place]), Wears dentures, and Wears glasses.  She is a prior patient of Dr. Raliegh Ip  Acute Concerns: [place]   Right foot pain -he was seen last week by another provider in this office after she dropped a plastic trash can on her foot approximately 1 week prior to being seen.  X-rays of the foot and ankle were negative for acute fracture or dislocation.  She was prescribed a short course of Norco for pain relief.  Today in the office she reports that although the bruising and edema slowly improving she continues to have pretty severe pain on the dorsal aspect of her right foot.  Also has noted some redness and warmth to the area where the trash can cause a small abrasion.  Chronic Issues: CAD status post bare-metal stent to RCA x2 in 2009.  She is followed by cardiology.  Most recent stress test April 2019.The right ventricular systolic pressure was increased consistent with moderate pulmonary hypertension.  EF 55 to 60%.  He has not tolerated Imdur in the past.  She is to continue beta-blocker  Persistent A fib -scribed Eliquis for anticoagulation and metoprolol 75 mg  twice a day.  Currently no complaints of palpitations  Chronic Diastolic HF -currently prescribed Lasix 40 mg daily  HTN -controlled with benazepril and metoprolol, and cardizem   COPD - Is followed by Pulmonary. She is prescribed flovent inhaler as well as Stiolto   GERD - takes Nexium daily - feels controlled.   Anxiety- Prescribed Zoloft 50 mg and Ativan 0.5 mg   S/p Colon Cancer - is followed by Oncology, Dr. Burr Medico. She has graduated to every 6 month follow ups  Insomnia - Takes Trazodone 100 mg QHS - feels well controlled.   Peripheral Neuropathy- takes gabapentin    Health Maintenance: Dental -- Has dentures.  Vision -- Dr. Schuyler Amor - Routine Care  Immunizations -- utd  Colonoscopy -- no longer needs Mammogram -- no longer needs PAP -- no longer needs Bone Density --    Past Medical History:  Diagnosis Date  . Adenocarcinoma, colon [facility]) Oncologist-- Dr Truitt Merle   Multifocal (2) Colon cancer @ ileocecal valve and ascending ( [id number]), Stage IIIB, grade I, MMR normal , 2 of 16 +lymph nodes, negative surgical margins---  06-02-2014  Right hemicolectomy w/ colostomy  . Allergy   . Anxiety   . Asthma    inhaler "sometimes"  . CAD (coronary artery disease)    a. LHC 4/09: pLAD 40, mDx 70-75, pLCx 30, mLCx 50, mRCA 90, EF 60% >> PCI: BMS x2 to RCA;  b. Myoview 8/12: normal  . Cataract    bil cateracts removed  . Chronic anemia   . Chronic diastolic CHF (congestive heart failure) (HCC)    a. Echo 912 - Mild LVH, EF 55-60%, no RWMA, Gr 1 DD, mild  MR, mild LAE, mild RAE, PASP 59 mmHg (mod to severe pulmo HTN)  . Clotting disorder (HCC)    hx of dvt, tia on eliquis  . Colostomy in place [facility])    s/p colostomy takedown 5/16  . Colovaginal fistula    s/p colostomy >> colostomy takedown 5/16  . COPD with chronic bronchitis ([place])   . Depression   . Dysrhythmia    A fib  . Essential hypertension   . GERD (gastroesophageal reflux disease)   . History of adenomatous polyp of  colon   . History of blood transfusion   . History of DVT of lower extremity   . History of hiatal hernia   . History of TIA (transient ischemic attack)    a. Head CT 6/15: small chronic lacunar infarct in thalamus  . Hyperlipidemia   . OA (osteoarthritis)   . Osteoporosis   . Peripheral neuropathy   . Pneumonia   . Pulmonary HTN ([place])    a. PASP on Echo in 9/12:  59 mmHg  . Renal insufficiency   . Sigmoid diverticulosis    s/p sigmoid colectomy  . Stroke ([place])    TIAs  . Wears dentures   . Wears glasses     Past Surgical History:  Procedure Laterality Date  . ABDOMINAL HYSTERECTOMY    . ANTERIOR CERVICAL DECOMP/DISCECTOMY FUSION  01-31-2009   C5 -- 7  . APPENDECTOMY    . Bone spurs Bilateral    Feet  . CARDIOVASCULAR STRESS TEST  02-26-2011   Normal lexiscan no exercise study/  no ischemia/  normal LV function and wall motion, ef 67%  . CARPAL TUNNEL RELEASE Right   . CATARACT EXTRACTION W/ INTRAOCULAR LENS  IMPLANT, BILATERAL Bilateral   . CHOLECYSTECTOMY    . COLOSTOMY N/A 06/02/2014   Procedure: DIVERTING DESCENDING END COLOSTOMY;  Surgeon: Donnie Mesa, MD;  Location: [facility];  Service: General;  Laterality: N/A;  . CORONARY ANGIOPLASTY WITH STENT PLACEMENT  11-04-2007  dr bensimhon   BMS x2 to RCA/  mild Non-obstructive disease LAD/  normal LVF  . DILATION AND CURETTAGE OF UTERUS    . ESOPHAGOGASTRODUODENOSCOPY N/A 10/28/2017   Procedure: ESOPHAGOGASTRODUODENOSCOPY (EGD);  Surgeon: Yetta Flock, MD;  Location: [facility] ENDOSCOPY;  Service: Gastroenterology;  Laterality: N/A;  . EVALUATION UNDER ANESTHESIA WITH ANAL FISTULECTOMY N/A 10/13/2014   Procedure: ANAL EXAM UNDER ANESTHESIA ;  Surgeon: [name], MD;  Location: [facility];  Service: General;  Laterality: N/A;  . HAND SURGERY     Tendon repair  . LAPAROSCOPIC SIGMOID COLECTOMY N/A 11/24/2014   Procedure: SIGMOID COLECTOMY AND COLSOTOMY CLOSURE;  Surgeon: Donnie Mesa, MD;  Location: [facility];  Service: General;   Laterality: N/A;  . LUMBAR LAMINECTOMY  10-29-2002,  1969   Left  L3 -- 4  decompression  . PARTIAL COLECTOMY N/A 06/02/2014   Procedure: RIGHT PARTIAL COLECTOMY ;  Surgeon: Donnie Mesa, MD;  Location: [facility];  Service: General;  Laterality: N/A;  . PATELLECTOMY Right 09/14/2013   Procedure: PATELLECTOMY;  Surgeon: Meredith Pel, MD;  Location: [facility];  Service: Orthopedics;  Laterality: Right;  . PROCTOSCOPY N/A 10/13/2014   Procedure: RIDGE PROCTOSCOPY;  Surgeon: [name], MD;  Location: [facility];  Service: General;  Laterality: N/A;  . REVISION TOTAL KNEE ARTHROPLASTY Bilateral right  04-02-2011/  left 1996 & 10-05-1999  . ROTATOR CUFF REPAIR Bilateral   . TONSILLECTOMY    . TOTAL KNEE ARTHROPLASTY Bilateral left 1994/  right 2000  .  TOTAL KNEE REVISION Left 08/02/2015   Procedure: LEFT FEMORAL REVISION;  Surgeon: Gaynelle Arabian, MD;  Location: [facility];  Service: Orthopedics;  Laterality: Left;  . TRANSTHORACIC ECHOCARDIOGRAM  12-01-2009   Grade I diastolic dysfunction/  ef 60%/  moderate MR/  mild TR    Current Outpatient Medications on File Prior to Visit  Medication Sig Dispense Refill  . acetaminophen (TYLENOL) 325 MG tablet Take 650 mg by mouth every 6 (six) hours as needed for moderate pain.     Marland Kitchen apixaban (ELIQUIS) 5 MG TABS tablet Take 1 tablet (5 mg total) by mouth 2 (two) times daily. 180 tablet 3  . Ascorbic Acid (VITAMIN C) 1000 MG tablet Take 1,000 mg by mouth daily.    . benazepril (LOTENSIN) 40 MG tablet Take 1 tablet (40 mg total) by mouth daily. 90 tablet 4  . albuterol (PROVENTIL HFA;VENTOLIN HFA) 108 (90 Base) MCG/ACT inhaler Inhale 2 puffs into the lungs every 6 (six) hours as needed for wheezing or shortness of breath. 1 Inhaler 0  . benzonatate (TESSALON PERLES) 100 MG capsule Take 1 capsule (100 mg total) by mouth 3 (three) times daily as needed. 20 capsule 0  . diltiazem (CARDIZEM CD) 120 MG 24 hr capsule Take 1 capsule by mouth as needed when Heart Rate is  above 511 and Systolic Blood Pressure is above 100 mm/hg 90 capsule 3  . esomeprazole (NEXIUM) 40 MG capsule Take 1 capsule (40 mg total) by mouth daily. 90 capsule 4  . ferrous sulfate 325 (65 FE) MG EC tablet Take 325 mg by mouth daily with breakfast.    . fluticasone (FLOVENT HFA) 44 MCG/ACT inhaler Inhale 2 puffs into the lungs 2 (two) times daily. 1 Inhaler 0  . furosemide (LASIX) 20 MG tablet Take 2 tablets (40 mg total) by mouth daily. 180 tablet 3  . gabapentin (NEURONTIN) 600 MG tablet Take 1 tablet (600 mg total) by mouth 2 (two) times daily. 180 tablet 3  . HYDROcodone-acetaminophen (NORCO/VICODIN) 5-325 MG tablet Take 1-2 tablets by mouth every 6 (six) hours as needed for moderate pain. 40 tablet 0  . LORazepam (ATIVAN) 0.5 MG tablet 1 tablet twice daily as needed for anxiety or sleep 60 tablet 3  . metoprolol tartrate (LOPRESSOR) 50 MG tablet Take 1.5 tablets (75 mg total) by mouth 2 (two) times daily. 270 tablet 3  . nitroGLYCERIN (NITROSTAT) 0.4 MG SL tablet Place 1 tablet (0.4 mg total) under the tongue every 5 (five) minutes as needed for chest pain (x 3 pills). Reported on 09/01/2015 25 tablet 3  . promethazine (PHENERGAN) 12.5 MG tablet Take 12.5 mg by mouth as directed.    . sertraline (ZOLOFT) 50 MG tablet Take 1 tablet (50 mg total) by mouth daily. 90 tablet 3  . Spacer/Aero-Holding Chambers (E-Z SPACER) inhaler Use as instructed 1 each 2  . Tiotropium Bromide-Olodaterol (STIOLTO RESPIMAT) 2.5-2.5 MCG/ACT AERS Inhale 2 puffs into the lungs daily. 1 Inhaler 3  . Tiotropium Bromide-Olodaterol (STIOLTO RESPIMAT) 2.5-2.5 MCG/ACT AERS Inhale 2 puffs into the lungs daily. 2 Inhaler 0  . traZODone (DESYREL) 100 MG tablet TAKE 1/2 (ONE-HALF) TABLET BY MOUTH AT BEDTIME 60 tablet 5  . triamcinolone cream (KENALOG) 0.1 % Apply 1 application topically 2 (two) times daily as needed (Eczema on legs). 30 g 2   No current facility-administered medications on file prior to visit.     No  Known Allergies  Family History  Problem Relation Age of Onset  . Osteoarthritis Mother   .  Heart failure Mother   . Colon cancer Maternal Uncle 65  . Heart Problems Father   . Cancer Sister 10       ? colon cancer   . Kidney cancer Brother 48  . Esophageal cancer Neg Hx   . Rectal cancer Neg Hx   . Stomach cancer Neg Hx   . Pancreatic cancer Neg Hx     Social History   Socioeconomic History  . Marital status: Widowed    Spouse name: Not on file  . Number of children: Not on file  . Years of education: Not on file  . Highest education level: Not on file  Occupational History  . Not on file  Social Needs  . Financial resource strain: Not on file  . Food insecurity:    Worry: Not on file    Inability: Not on file  . Transportation needs:    Medical: Not on file    Non-medical: Not on file  Tobacco Use  . Smoking status: Former Smoker    Packs/day: 0.50    Years: 15.00    Pack years: 7.50    Types: Cigarettes    Last attempt to quit: 07/16/1967    Years since quitting: 50.9  . Smokeless tobacco: Never Used  Substance and Sexual Activity  . Alcohol use: No    Alcohol/week: 0.0 standard drinks  . Drug use: No  . Sexual activity: Not on file  Lifestyle  . Physical activity:    Days per week: Not on file    Minutes per session: Not on file  . Stress: Not on file  Relationships  . Social connections:    Talks on phone: Not on file    Gets together: Not on file    Attends religious service: Not on file    Active member of club or organization: Not on file    Attends meetings of clubs or organizations: Not on file    Relationship status: Not on file  . Intimate partner violence:    Fear of current or ex partner: Not on file    Emotionally abused: Not on file    Physically abused: Not on file    Forced sexual activity: Not on file  Other Topics Concern  . Not on file  Social History Narrative   Married, 2 children.  As of November 2015 her husband has been  living in a nursing home for tendon after years as he suffers from multiple medical problems.   Patient does not drink alcohol nor does she smoke or chew tobacco products   Right handed   8th grade   1 cup daily    Review of Systems  Constitutional: Negative.   HENT: Negative.   Eyes: Negative.   Respiratory: Positive for shortness of breath (on occassion).   Cardiovascular: Negative.   Gastrointestinal: Negative.   Genitourinary: Negative.   Musculoskeletal: Negative.   Skin: Negative.   Neurological: Negative.   Endo/Heme/Allergies: Bruises/bleeds easily.  Psychiatric/Behavioral: The patient has insomnia.   All other systems reviewed and are negative.     BP (!) 154/90   Temp 98.2 F (36.8 C)   Wt 192 lb (87.1 kg)   BMI 32.96 kg/m   Physical Exam  Constitutional: She is oriented to person, place, and time. She appears well-developed and well-nourished. No distress.  Eyes: Pupils are equal, round, and reactive to light. EOM are normal.  Cardiovascular: Normal rate and intact distal pulses. An irregularly irregular rhythm present. Exam  reveals no gallop and no friction rub.  No murmur heard. Pulmonary/Chest: Effort normal and breath sounds normal. No stridor. No respiratory distress. She has no wheezes. She has no rales. She exhibits no tenderness.  Musculoskeletal: She exhibits tenderness (right foot). She exhibits no edema.  Neurological: She is alert and oriented to person, place, and time.  Skin: Skin is warm and dry. Capillary refill takes less than 2 seconds. She is not diaphoretic. There is erythema.  Significant bruising throughout right foot that appears to be healing well.  She has an area on the dorsal aspect of her right foot that is painful to palpation as well as warm.  There is no active drainage.  Psychiatric: She has a normal mood and affect. Her behavior is normal. Judgment and thought content normal.  Nursing note and vitals reviewed.   Recent Results  (from the past 2160 hour(s))  Basic metabolic panel     Status: Abnormal   Collection Time: 03/26/18  1:45 PM  Result Value Ref Range   Sodium 141 135 - 145 mEq/L   Potassium 4.6 3.5 - 5.1 mEq/L   Chloride 104 96 - 112 mEq/L   CO2 29 19 - 32 mEq/L   Glucose, Bld 106 (H) 70 - 99 mg/dL   BUN 39 (H) 6 - 23 mg/dL   Creatinine, Ser 1.30 (H) 0.40 - 1.20 mg/dL   Calcium 9.2 8.4 - 10.5 mg/dL   GFR 41.27 (L) >60.00 mL/min  CBC with Differential/Platelet     Status: Abnormal   Collection Time: 03/26/18  1:45 PM  Result Value Ref Range   WBC 8.1 4.0 - 10.5 K/uL   RBC 4.30 3.87 - 5.11 Mil/uL   Hemoglobin 13.8 12.0 - 15.0 g/dL   HCT 41.4 36.0 - 46.0 %   MCV 96.3 78.0 - 100.0 fl   MCHC 33.2 30.0 - 36.0 g/dL   RDW 14.2 11.5 - 15.5 %   Platelets 145.0 (L) 150.0 - 400.0 K/uL   Neutrophils Relative % 71.8 43.0 - 77.0 %   Lymphocytes Relative 18.0 12.0 - 46.0 %   Monocytes Relative 8.4 3.0 - 12.0 %   Eosinophils Relative 1.1 0.0 - 5.0 %   Basophils Relative 0.7 0.0 - 3.0 %   Neutro Abs 5.8 1.4 - 7.7 K/uL   Lymphs Abs 1.5 0.7 - 4.0 K/uL   Monocytes Absolute 0.7 0.1 - 1.0 K/uL   Eosinophils Absolute 0.1 0.0 - 0.7 K/uL   Basophils Absolute 0.1 0.0 - 0.1 K/uL  Basic Metabolic Panel (BMET)     Status: Abnormal   Collection Time: 04/20/18 11:35 AM  Result Value Ref Range   Glucose 90 65 - 99 mg/dL   BUN 41 (H) 8 - 27 mg/dL   Creatinine, Ser 1.33 (H) 0.57 - 1.00 mg/dL   GFR calc non Af Amer 36 (L) >59 mL/min/1.73   GFR calc Af Amer 42 (L) >59 mL/min/1.73   BUN/Creatinine Ratio 31 (H) 12 - 28   Sodium 139 134 - 144 mmol/L   Potassium 4.6 3.5 - 5.2 mmol/L   Chloride 101 96 - 106 mmol/L   CO2 22 20 - 29 mmol/L   Calcium 9.1 8.7 - 10.3 mg/dL    Assessment/Plan: 1. Encounter to establish care - Follow up in 4 months or sooner if needed  2. Cellulitis of right lower extremity  - doxycycline (VIBRAMYCIN) 100 MG capsule; Take 1 capsule (100 mg total) by mouth 2 (two) times daily.  Dispense: 14  capsule; Refill: 0 - Follow up in one week  3. Chronic anticoagulation - Continue with Cardiology plan of care  4. Chronic atrial fibrillation - Continue with Cardiology plan of care  5. Coronary artery disease of native artery of native heart with stable angina pectoris ([place]) - Continue with Cardiology plan of care  6. Primary insomnia Well controlled on Trazodone. No dosaged adjustment   7. Anxiety and depression - Continue with Zoloft and Ativan   8. DIASTOLIC HEART FAILURE, CHRONIC - Continue with Cardiology plan of care   9. Pulmonary emphysema, unspecified emphysema type ([place])  Continue with pulmonary plan of care  [name], NP

## 2018-06-30 ENCOUNTER — Ambulatory Visit (INDEPENDENT_AMBULATORY_CARE_PROVIDER_SITE_OTHER): Payer: PPO | Admitting: Adult Health

## 2018-06-30 ENCOUNTER — Encounter: Payer: Self-pay | Admitting: Adult Health

## 2018-06-30 DIAGNOSIS — L03115 Cellulitis of right lower limb: Secondary | ICD-10-CM

## 2018-06-30 MED ORDER — DOXYCYCLINE HYCLATE 100 MG PO CAPS: 100.0000 mg | ORAL_CAPSULE | Freq: Two times a day (BID) | ORAL | 0 refills | Status: DC

## 2018-06-30 MED ORDER — HYDROCODONE-ACETAMINOPHEN 5-325 MG PO TABS: 1.0000 | ORAL_TABLET | Freq: Four times a day (QID) | ORAL | 0 refills | Status: DC | PRN

## 2018-06-30 NOTE — Progress Notes (Signed)
Subjective:    Patient ID: [name], female    DOB: [birth year], 84 y.o.   MRN: [id number]  HPI 84 year old female who  has a past medical history of Adenocarcinoma, colon ([place]) (Oncologist-- Dr Truitt Merle), Allergy, Anxiety, Asthma, CAD (coronary artery disease), Cataract, Chronic anemia, Chronic diastolic CHF (congestive heart failure) ([place]), Clotting disorder ([place]), Colostomy in place [facility]), Colovaginal fistula, COPD with chronic bronchitis ([place]), Depression, Dysrhythmia, Essential hypertension, GERD (gastroesophageal reflux disease), History of adenomatous polyp of colon, History of blood transfusion, History of DVT of lower extremity, History of hiatal hernia, History of TIA (transient ischemic attack), Hyperlipidemia, OA (osteoarthritis), Osteoporosis, Peripheral neuropathy, Pneumonia, Pulmonary HTN ([place]), Renal insufficiency, Sigmoid diverticulosis, Stroke ([place]), Wears dentures, and Wears glasses.  She presents to the office today for follow up regarding possible cellulitis of her right foot. She was seen one week ago for follow up after being seen by another provider in the office after she dropped a plastic trash can on her foot. She had an xray done which was negative.   Last week she was started on a course of doxycycline  Today she reports that she feels as though her right foot is improving, she continues to have pretty severe pain - especially at night. She feels as though the swelling has also improved.    Review of Systems See HPI  Past Medical History:  Diagnosis Date  . Adenocarcinoma, colon [facility]) Oncologist-- Dr Truitt Merle   Multifocal (2) Colon cancer @ ileocecal valve and ascending ( [id number]), Stage IIIB, grade I, MMR normal , 2 of 16 +lymph nodes, negative surgical margins---  06-02-2014  Right hemicolectomy w/ colostomy  . Allergy   . Anxiety   . Asthma    inhaler "sometimes"  . CAD (coronary artery disease)    a. LHC 4/09: pLAD 40, mDx 70-75, pLCx 30, mLCx 50, mRCA  90, EF 60% >> PCI: BMS x2 to RCA;  b. Myoview 8/12: normal  . Cataract    bil cateracts removed  . Chronic anemia   . Chronic diastolic CHF (congestive heart failure) (HCC)    a. Echo 912 - Mild LVH, EF 55-60%, no RWMA, Gr 1 DD, mild MR, mild LAE, mild RAE, PASP 59 mmHg (mod to severe pulmo HTN)  . Clotting disorder (H[facility])    hx of dvt, tia on eliquis  . Colostomy in place [facility])    s/p colostomy takedown 5/16  . Colovaginal fistula    s/p colostomy >> colostomy takedown 5/16  . COPD with chronic bronchitis ([place])   . Depression   . Dysrhythmia    A fib  . Essential hypertension   . GERD (gastroesophageal reflux disease)   . History of adenomatous polyp of colon   . History of blood transfusion   . History of DVT of lower extremity   . History of hiatal hernia   . History of TIA (transient ischemic attack)    a. Head CT 6/15: small chronic lacunar infarct in thalamus  . Hyperlipidemia   . OA (osteoarthritis)   . Osteoporosis   . Peripheral neuropathy   . Pneumonia   . Pulmonary HTN ([place])    a. PASP on Echo in 9/12:  59 mmHg  . Renal insufficiency   . Sigmoid diverticulosis    s/p sigmoid colectomy  . Stroke ([place])    TIAs  . Wears dentures   . Wears glasses     Social History   Socioeconomic History  .  Marital status: Widowed    Spouse name: Not on file  . Number of children: Not on file  . Years of education: Not on file  . Highest education level: Not on file  Occupational History  . Not on file  Social Needs  . Financial resource strain: Not on file  . Food insecurity:    Worry: Not on file    Inability: Not on file  . Transportation needs:    Medical: Not on file    Non-medical: Not on file  Tobacco Use  . Smoking status: Former Smoker    Packs/day: 0.50    Years: 15.00    Pack years: 7.50    Types: Cigarettes    Last attempt to quit: 07/16/1967    Years since quitting: 50.9  . Smokeless tobacco: Never Used  Substance and Sexual Activity  . Alcohol  use: No    Alcohol/week: 0.0 standard drinks  . Drug use: No  . Sexual activity: Not on file  Lifestyle  . Physical activity:    Days per week: Not on file    Minutes per session: Not on file  . Stress: Not on file  Relationships  . Social connections:    Talks on phone: Not on file    Gets together: Not on file    Attends religious service: Not on file    Active member of club or organization: Not on file    Attends meetings of clubs or organizations: Not on file    Relationship status: Not on file  . Intimate partner violence:    Fear of current or ex partner: Not on file    Emotionally abused: Not on file    Physically abused: Not on file    Forced sexual activity: Not on file  Other Topics Concern  . Not on file  Social History Narrative   Married, 2 children.  As of November 2015 her husband has been living in a nursing home for tendon after years as he suffers from multiple medical problems.   Patient does not drink alcohol nor does she smoke or chew tobacco products   Right handed   8th grade   1 cup daily    Past Surgical History:  Procedure Laterality Date  . ABDOMINAL HYSTERECTOMY    . ANTERIOR CERVICAL DECOMP/DISCECTOMY FUSION  01-31-2009   C5 -- 7  . APPENDECTOMY    . Bone spurs Bilateral    Feet  . CARDIOVASCULAR STRESS TEST  02-26-2011   Normal lexiscan no exercise study/  no ischemia/  normal LV function and wall motion, ef 67%  . CARPAL TUNNEL RELEASE Right   . CATARACT EXTRACTION W/ INTRAOCULAR LENS  IMPLANT, BILATERAL Bilateral   . CHOLECYSTECTOMY    . COLOSTOMY N/A 06/02/2014   Procedure: DIVERTING DESCENDING END COLOSTOMY;  Surgeon: Donnie Mesa, MD;  Location: [facility];  Service: General;  Laterality: N/A;  . CORONARY ANGIOPLASTY WITH STENT PLACEMENT  11-04-2007  dr bensimhon   BMS x2 to RCA/  mild Non-obstructive disease LAD/  normal LVF  . DILATION AND CURETTAGE OF UTERUS    . ESOPHAGOGASTRODUODENOSCOPY N/A 10/28/2017   Procedure:  ESOPHAGOGASTRODUODENOSCOPY (EGD);  Surgeon: Yetta Flock, MD;  Location: [facility];  Service: Gastroenterology;  Laterality: N/A;  . EVALUATION UNDER ANESTHESIA WITH ANAL FISTULECTOMY N/A 10/13/2014   Procedure: ANAL EXAM UNDER ANESTHESIA ;  Surgeon: [name], MD;  Location: [facility];  Service: General;  Laterality: N/A;  . HAND SURGERY  Tendon repair  . LAPAROSCOPIC SIGMOID COLECTOMY N/A 11/24/2014   Procedure: SIGMOID COLECTOMY AND COLSOTOMY CLOSURE;  Surgeon: Donnie Mesa, MD;  Location: [facility];  Service: General;  Laterality: N/A;  . LUMBAR LAMINECTOMY  10-29-2002,  1969   Left  L3 -- 4  decompression  . PARTIAL COLECTOMY N/A 06/02/2014   Procedure: RIGHT PARTIAL COLECTOMY ;  Surgeon: Donnie Mesa, MD;  Location: [facility];  Service: General;  Laterality: N/A;  . PATELLECTOMY Right 09/14/2013   Procedure: PATELLECTOMY;  Surgeon: Meredith Pel, MD;  Location: [facility];  Service: Orthopedics;  Laterality: Right;  . PROCTOSCOPY N/A 10/13/2014   Procedure: RIDGE PROCTOSCOPY;  Surgeon: [name], MD;  Location: [facility];  Service: General;  Laterality: N/A;  . REVISION TOTAL KNEE ARTHROPLASTY Bilateral right  04-02-2011/  left 1996 & 10-05-1999  . ROTATOR CUFF REPAIR Bilateral   . TONSILLECTOMY    . TOTAL KNEE ARTHROPLASTY Bilateral left 1994/  right 2000  . TOTAL KNEE REVISION Left 08/02/2015   Procedure: LEFT FEMORAL REVISION;  Surgeon: Gaynelle Arabian, MD;  Location: [facility];  Service: Orthopedics;  Laterality: Left;  . TRANSTHORACIC ECHOCARDIOGRAM  12-01-2009   Grade I diastolic dysfunction/  ef 60%/  moderate MR/  mild TR    Family History  Problem Relation Age of Onset  . Osteoarthritis Mother   . Heart failure Mother   . Colon cancer Maternal Uncle 20  . Heart Problems Father   . Cancer Sister 68       ? colon cancer   . Kidney cancer Brother 72  . Esophageal cancer Neg Hx   . Rectal cancer Neg Hx   . Stomach cancer Neg Hx   . Pancreatic cancer Neg Hx     No  Known Allergies  Current Outpatient Medications on File Prior to Visit  Medication Sig Dispense Refill  . acetaminophen (TYLENOL) 325 MG tablet Take 650 mg by mouth every 6 (six) hours as needed for moderate pain.     Marland Kitchen albuterol (PROVENTIL HFA;VENTOLIN HFA) 108 (90 Base) MCG/ACT inhaler Inhale 2 puffs into the lungs every 6 (six) hours as needed for wheezing or shortness of breath. 1 Inhaler 0  . apixaban (ELIQUIS) 5 MG TABS tablet Take 1 tablet (5 mg total) by mouth 2 (two) times daily. 180 tablet 3  . Ascorbic Acid (VITAMIN C) 1000 MG tablet Take 1,000 mg by mouth daily.    . benazepril (LOTENSIN) 40 MG tablet Take 1 tablet (40 mg total) by mouth daily. 90 tablet 4  . benzonatate (TESSALON PERLES) 100 MG capsule Take 1 capsule (100 mg total) by mouth 3 (three) times daily as needed. 20 capsule 0  . diltiazem (CARDIZEM CD) 120 MG 24 hr capsule Take 1 capsule by mouth as needed when Heart Rate is above 086 and Systolic Blood Pressure is above 100 mm/hg 90 capsule 3  . esomeprazole (NEXIUM) 40 MG capsule Take 1 capsule (40 mg total) by mouth daily. 90 capsule 4  . ferrous sulfate 325 (65 FE) MG EC tablet Take 325 mg by mouth daily with breakfast.    . fluticasone (FLOVENT HFA) 44 MCG/ACT inhaler Inhale 2 puffs into the lungs 2 (two) times daily. 1 Inhaler 0  . furosemide (LASIX) 20 MG tablet Take 2 tablets (40 mg total) by mouth daily. 180 tablet 3  . gabapentin (NEURONTIN) 600 MG tablet Take 1 tablet (600 mg total) by mouth 2 (two) times daily. 180 tablet 3  . LORazepam (ATIVAN) 0.5 MG  tablet 1 tablet twice daily as needed for anxiety or sleep 60 tablet 3  . nitroGLYCERIN (NITROSTAT) 0.4 MG SL tablet Place 1 tablet (0.4 mg total) under the tongue every 5 (five) minutes as needed for chest pain (x 3 pills). Reported on 09/01/2015 25 tablet 3  . promethazine (PHENERGAN) 12.5 MG tablet Take 12.5 mg by mouth as directed.    . sertraline (ZOLOFT) 50 MG tablet Take 1 tablet (50 mg total) by mouth daily.  90 tablet 3  . Spacer/Aero-Holding Chambers (E-Z SPACER) inhaler Use as instructed 1 each 2  . Tiotropium Bromide-Olodaterol (STIOLTO RESPIMAT) 2.5-2.5 MCG/ACT AERS Inhale 2 puffs into the lungs daily. 1 Inhaler 3  . Tiotropium Bromide-Olodaterol (STIOLTO RESPIMAT) 2.5-2.5 MCG/ACT AERS Inhale 2 puffs into the lungs daily. 2 Inhaler 0  . traZODone (DESYREL) 100 MG tablet TAKE 1/2 (ONE-HALF) TABLET BY MOUTH AT BEDTIME 60 tablet 5  . triamcinolone cream (KENALOG) 0.1 % Apply 1 application topically 2 (two) times daily as needed (Eczema on legs). 30 g 2  . metoprolol tartrate (LOPRESSOR) 50 MG tablet Take 1.5 tablets (75 mg total) by mouth 2 (two) times daily. 270 tablet 3   No current facility-administered medications on file prior to visit.     BP 134/80   Temp 97.7 F (36.5 C)   Wt 187 lb (84.8 kg)   BMI 32.10 kg/m       Objective:   Physical Exam Vitals signs and nursing note reviewed.  Cardiovascular:     Rate and Rhythm: Normal rate and regular rhythm.     Pulses: Normal pulses.     Heart sounds: Normal heart sounds.  Pulmonary:     Effort: Pulmonary effort is normal.     Breath sounds: Normal breath sounds.  Skin:    General: Skin is warm and dry.     Comments: Bruising to right foot has started to subside. She continues to have trace warmth and redness at site of injury. Has pain throughout dorsal aspect of right foot with palpation   Neurological:     General: No focal deficit present.     Mental Status: She is alert.  Psychiatric:        Mood and Affect: Mood normal.        Behavior: Behavior normal.        Thought Content: Thought content normal.        Judgment: Judgment normal.       Assessment & Plan:  1. Cellulitis of right lower extremity - Cellulitis is improving. Will send in another course of Doxycyline and some pain medication. She will follow up if not completely resolved next week  - doxycycline (VIBRAMYCIN) 100 MG capsule; Take 1 capsule (100 mg  total) by mouth 2 (two) times daily.  Dispense: 14 capsule; Refill: 0 - HYDROcodone-acetaminophen (NORCO/VICODIN) 5-325 MG tablet; Take 1-2 tablets by mouth every 6 (six) hours as needed for moderate pain.  Dispense: 10 tablet; Refill: 0   [name], NP

## 2018-07-01 ENCOUNTER — Telehealth: Payer: Self-pay | Admitting: Family Medicine

## 2018-07-01 NOTE — Telephone Encounter (Signed)
Copied from [place] [id number]. Topic: General - Other >> Jul 01, 2018  1:21 PM Valla Leaver wrote: Reason for CRM: Glenna with [facility] [employer] calling to notify that the patient signed up for diabetes and heart care program with [facility] and they need confirmation of a diabetes and congestive heart failure diagnosis.

## 2018-07-01 NOTE — Telephone Encounter (Signed)
Spoke to a representative and advised that the pt will need to sign a medical release before any information can be given.  Nothing further needed at this time.

## 2018-07-20 ENCOUNTER — Telehealth: Payer: Self-pay | Admitting: Hematology

## 2018-07-20 ENCOUNTER — Inpatient Hospital Stay: Payer: PRIVATE HEALTH INSURANCE

## 2018-07-20 ENCOUNTER — Inpatient Hospital Stay: Payer: PRIVATE HEALTH INSURANCE | Attending: Hematology | Admitting: Hematology

## 2018-07-20 ENCOUNTER — Encounter: Payer: Self-pay | Admitting: Hematology

## 2018-07-20 VITALS — BP 160/93 | HR 84 | Temp 97.6°F | Resp 18 | Ht 64.0 in | Wt 190.7 lb

## 2018-07-20 DIAGNOSIS — Z8673 Personal history of transient ischemic attack (TIA), and cerebral infarction without residual deficits: Secondary | ICD-10-CM | POA: Insufficient documentation

## 2018-07-20 DIAGNOSIS — J449 Chronic obstructive pulmonary disease, unspecified: Secondary | ICD-10-CM | POA: Diagnosis not present

## 2018-07-20 DIAGNOSIS — Z7901 Long term (current) use of anticoagulants: Secondary | ICD-10-CM | POA: Insufficient documentation

## 2018-07-20 DIAGNOSIS — K449 Diaphragmatic hernia without obstruction or gangrene: Secondary | ICD-10-CM | POA: Diagnosis not present

## 2018-07-20 DIAGNOSIS — I700 Atherosclerosis of aorta: Secondary | ICD-10-CM | POA: Diagnosis not present

## 2018-07-20 DIAGNOSIS — M1990 Unspecified osteoarthritis, unspecified site: Secondary | ICD-10-CM | POA: Diagnosis not present

## 2018-07-20 DIAGNOSIS — K529 Noninfective gastroenteritis and colitis, unspecified: Secondary | ICD-10-CM | POA: Insufficient documentation

## 2018-07-20 DIAGNOSIS — M810 Age-related osteoporosis without current pathological fracture: Secondary | ICD-10-CM | POA: Insufficient documentation

## 2018-07-20 DIAGNOSIS — I5032 Chronic diastolic (congestive) heart failure: Secondary | ICD-10-CM | POA: Diagnosis not present

## 2018-07-20 DIAGNOSIS — Z85038 Personal history of other malignant neoplasm of large intestine: Secondary | ICD-10-CM | POA: Insufficient documentation

## 2018-07-20 DIAGNOSIS — I2510 Atherosclerotic heart disease of native coronary artery without angina pectoris: Secondary | ICD-10-CM | POA: Insufficient documentation

## 2018-07-20 DIAGNOSIS — R5383 Other fatigue: Secondary | ICD-10-CM | POA: Diagnosis not present

## 2018-07-20 DIAGNOSIS — E785 Hyperlipidemia, unspecified: Secondary | ICD-10-CM | POA: Insufficient documentation

## 2018-07-20 DIAGNOSIS — Z79899 Other long term (current) drug therapy: Secondary | ICD-10-CM

## 2018-07-20 DIAGNOSIS — Z9049 Acquired absence of other specified parts of digestive tract: Secondary | ICD-10-CM | POA: Diagnosis not present

## 2018-07-20 DIAGNOSIS — I130 Hypertensive heart and chronic kidney disease with heart failure and stage 1 through stage 4 chronic kidney disease, or unspecified chronic kidney disease: Secondary | ICD-10-CM | POA: Insufficient documentation

## 2018-07-20 DIAGNOSIS — I10 Essential (primary) hypertension: Secondary | ICD-10-CM

## 2018-07-20 DIAGNOSIS — R531 Weakness: Secondary | ICD-10-CM | POA: Insufficient documentation

## 2018-07-20 DIAGNOSIS — G629 Polyneuropathy, unspecified: Secondary | ICD-10-CM | POA: Diagnosis not present

## 2018-07-20 DIAGNOSIS — Z800 Family history of malignant neoplasm of digestive organs: Secondary | ICD-10-CM | POA: Diagnosis not present

## 2018-07-20 DIAGNOSIS — C182 Malignant neoplasm of ascending colon: Secondary | ICD-10-CM

## 2018-07-20 DIAGNOSIS — M545 Low back pain: Secondary | ICD-10-CM | POA: Insufficient documentation

## 2018-07-20 DIAGNOSIS — R05 Cough: Secondary | ICD-10-CM | POA: Diagnosis not present

## 2018-07-20 DIAGNOSIS — K219 Gastro-esophageal reflux disease without esophagitis: Secondary | ICD-10-CM | POA: Diagnosis not present

## 2018-07-20 DIAGNOSIS — R918 Other nonspecific abnormal finding of lung field: Secondary | ICD-10-CM | POA: Diagnosis not present

## 2018-07-20 DIAGNOSIS — N183 Chronic kidney disease, stage 3 (moderate): Secondary | ICD-10-CM | POA: Insufficient documentation

## 2018-07-20 LAB — COMPREHENSIVE METABOLIC PANEL
ALT: 18 U/L (ref 0–44)
AST: 25 U/L (ref 15–41)
Albumin: 4.1 g/dL (ref 3.5–5.0)
Alkaline Phosphatase: 60 U/L (ref 38–126)
Anion gap: 11 (ref 5–15)
BUN: 31 mg/dL — ABNORMAL HIGH (ref 8–23)
CO2: 27 mmol/L (ref 22–32)
Calcium: 9.6 mg/dL (ref 8.9–10.3)
Chloride: 105 mmol/L (ref 98–111)
Creatinine, Ser: 1.22 mg/dL — ABNORMAL HIGH (ref 0.44–1.00)
GFR calc Af Amer: 46 mL/min — ABNORMAL LOW (ref >60)
GFR calc non Af Amer: 40 mL/min — ABNORMAL LOW (ref >60)
Glucose, Bld: 88 mg/dL (ref 70–99)
Potassium: 4.6 mmol/L (ref 3.5–5.1)
Sodium: 143 mmol/L (ref 135–145)
Total Bilirubin: 0.6 mg/dL (ref 0.3–1.2)
Total Protein: 7.9 g/dL (ref 6.5–8.1)

## 2018-07-20 LAB — CBC WITH DIFFERENTIAL (CANCER CENTER ONLY)
Abs Immature Granulocytes: 0.02 10*3/uL (ref 0.00–0.07)
Basophils Absolute: 0 10*3/uL (ref 0.0–0.1)
Basophils Relative: 0 %
Eosinophils Absolute: 0.1 10*3/uL (ref 0.0–0.5)
Eosinophils Relative: 1 %
HCT: 43.3 % (ref 36.0–46.0)
Hemoglobin: 13.3 g/dL (ref 12.0–15.0)
Immature Granulocytes: 0 %
Lymphocytes Relative: 20 %
Lymphs Abs: 1.3 10*3/uL (ref 0.7–4.0)
MCH: 30.9 pg (ref 26.0–34.0)
MCHC: 30.7 g/dL (ref 30.0–36.0)
MCV: 100.5 fL — ABNORMAL HIGH (ref 80.0–100.0)
Monocytes Absolute: 0.6 10*3/uL (ref 0.1–1.0)
Monocytes Relative: 9 %
Neutro Abs: 4.7 10*3/uL (ref 1.7–7.7)
Neutrophils Relative %: 70 %
Platelet Count: 131 10*3/uL — ABNORMAL LOW (ref 150–400)
RBC: 4.31 MIL/uL (ref 3.87–5.11)
RDW: 13.5 % (ref 11.5–15.5)
WBC Count: 6.7 10*3/uL (ref 4.0–10.5)
nRBC: 0 % (ref 0.0–0.2)

## 2018-07-20 LAB — CEA (IN HOUSE-CHCC): CEA (CHCC-In House): 4.4 ng/mL (ref 0.00–5.00)

## 2018-07-20 NOTE — Telephone Encounter (Signed)
Printed calendar and avs. °

## 2018-07-20 NOTE — Progress Notes (Signed)
[place]   Telephone:([phone]) [phone] [phone]   Clinic Follow up Note   Patient Care Team: [name], NP as PCP - General (Family Medicine) [name], Wonda Cheng, MD as PCP - Cardiology (Cardiology) Dimitri Ped, RN as [place] Management  Date of Service:  07/20/2018  CHIEF COMPLAINT: Follow up multifocal colon cancer  SUMMARY OF ONCOLOGIC HISTORY: Oncology History   Cancer Staging Cancer of ascending colon [facility]) Staging form: Colon and Rectum, AJCC 7th Edition - Clinical: Stage IIIB (T4a, N1, M0) - Unsigned       Cancer of ascending colon ([place])   06/02/2014 Initial Diagnosis    Adenocarcinoma, colon. she presented with anemia, nauseam anorexia and abdominal pain and [facility].    06/02/2014 Surgery    exploratory laparotomy, right hemicolectomy, and descending colostomy by Dr. Georgette Dover    06/02/2014 Pathologic Stage    multifocal (2) colon cancer at  ileocecal valve and ascending colon.,  mT4N1Mx, at least stage IIIB, grade 1, MMR normal, surgical margins negative.     06/02/2014 Tumor Marker    CEA 1.7    11/24/2014 Surgery    SIGMOID COLECTOMY AND COLSOTOMY CLOSURE with Donnie Mesa    03/06/2016 Imaging    CT chest, abdomen and pelvis without contrast showed stable remote surgical changes, no evidence of metastatic disease. Stable atherosclerotic calcifications.    09/12/2016 Imaging    CT CAP W/ CONTRAST 09/12/2016 IMPRESSION: 1. Status post partial right hemicolectomy, without evidence to suggest local recurrence of disease or metastatic disease to the chest, abdomen or pelvis. 2. Cardiomegaly with biatrial dilatation. 3. Aortic atherosclerosis, in addition to left main and 3 vessel coronary artery disease. 4. Moderate to large hiatal hernia.    12/25/2016 Procedure    Colonoscopy Impression: - Two 3 to 5 mm polyps in the sigmoid colon and in the transverse colon, removed with a cold  snare. Resceted and retrieved - Internal hemorrhoids - [place] surgeries as described. The examination was otherwise normal on direct and retroflexion views    07/22/2017 Imaging    CT CAP 07/22/17 IMPRESSION: 1. No evidence of local tumor recurrence at ileocolic anastomosis in the right abdomen. 2. No evidence of metastatic disease in the abdomen or pelvis. 3. New nonspecific patchy ground-glass nodularity in the right greater than left lungs. The ground-glass density is more suggestive of an inflammatory etiology. Recommend attention on follow-up chest CT in 3 months. 4. New nonspecific mild mediastinal lymphadenopathy. Given the above findings in the lungs, these nodes [place] be reactive, although short-term chest CT follow-up is again recommended in 3 months. 5. Liver surface appears finely irregular, question hepatic cirrhosis. Consider hepatic elastography for further liver fibrosis risk stratification, as clinically warranted. 6. Chronic findings include: Aortic Atherosclerosis (ICD10-I70.0). Left main and 3 vessel coronary atherosclerosis. Moderate hiatal hernia. Chronic mosaic attenuation in the lungs, most commonly due to air trapping from small airways disease.    10/26/2017 Imaging    CT-angio Chest 10/26/2017 IMPRESSION: 1. Negative for acute pulmonary embolus. 2. Right greater than left ground-glass density with increased nodularity in the right upper, middle and lower lobes, suspicious for pulmonary infection, possible atypical pneumonia. Short interval CT follow-up recommended following antibiotic trial and ensure clearing. Persistent mediastinal adenopathy, possibly reactive and not significantly changed since January 2019    10/27/2017 Imaging    CT AP 10/27/2017 IMPRESSION: 1. Mild hazy right basilar airspace opacity [place] reflect mild asymmetric interstitial edema or possibly  pneumonia. 2. Moderate hiatal hernia. 3. Scattered coronary artery calcifications. 4. Small left  renal cyst.    10/28/2017 Pathology Results    10/28/2017 Surgical pathology Diagnosis Stomach, biopsy - ANTRAL AND OXYNTIC MUCOSA WITH SLIGHT CHRONIC INFLAMMATION. - WARTHIN-STARRY STAIN NEGATIVE FOR HELICOBACTER PYLORI. - NO INTESTINAL METAPLASIA, DYSPLASIA, OR MALIGNANCY.    04/06/2018 Imaging    CT Chest WO Contrast 04/06/18  IMPRESSION: 1. Less patchy opacity in the lower lobes compared to most recent CT. Areas of mosaic attenuation in the lower lobes currently is likely indicative of chronic small airways obstruction and atelectasis. No frank consolidation. There are areas of more typical appearing atelectatic change bilaterally. There is a stable 2 mm nodular opacity in the right lower lobe. No pleural effusion evident. There is mild underlying centrilobular emphysematous change.  2. Mildly prominent lymph node anterior to the carina, likely of reactive etiology. No progression of adenopathy.  3. Aortic atherosclerosis. Foci of great vessel and coronary artery calcification.  4.  Moderate hiatal hernia.  5.  Gallbladder absent.  Aortic Atherosclerosis (ICD10-I70.0) and Emphysema (ICD10-J43.9).      CURRENT THERAPY:  Surveillance   INTERVAL HISTORY:  [name] is here for a follow up of colon cancer. She was last seen by me 6 months ago. She presents to the clinic today accompanied by family member. She notes she still has significant diarrhea. She is not taking imodium. She goes to bathroom 6-10 times. She notes her mouth and skin stays dry most of the time. She notes she overall feels weak and tired. This is not new to her. She notes lower back pain from her back issues. She is still able to live by herself and take care of herself. She does not drive herself as much so she get driven by her sister-in-law, otherwise she does not go out. She does ambulate with walker. She notes she ears adequately.  She was seen by pulmonologist after CT scan which shows 2 cm mass  of lung and enlarged LN. She has not gotten biopsy for this yet.     REVIEW OF SYSTEMS:   Constitutional: Denies fevers, chills or abnormal weight loss (+) tiredness and weakness.  Eyes: Denies blurriness of vision Ears, nose, mouth, throat, and face: Denies mucositis or sore throat Respiratory: Denies cough, dyspnea or wheezes Cardiovascular: Denies palpitation, chest discomfort or lower extremity swelling Gastrointestinal:  Denies nausea, heartburn (+) Significant diarrhea  Skin: Denies abnormal skin rashes MSK: (+) Lower back pain (+) ambulates with walker  Lymphatics: Denies new lymphadenopathy or easy bruising Neurological:Denies numbness, tingling or new weaknesses Behavioral/Psych: Mood is stable, no new changes  All other systems were reviewed with the patient and are negative.  MEDICAL HISTORY:  Past Medical History:  Diagnosis Date  . Adenocarcinoma, colon [facility]) Oncologist-- Dr Truitt Merle   Multifocal (2) Colon cancer @ ileocecal valve and ascending ( mT4N1Mx), Stage IIIB, grade I, MMR normal , 2 of 16 +lymph nodes, negative surgical margins---  06-02-2014  Right hemicolectomy w/ colostomy  . Allergy   . Anxiety   . Asthma    inhaler "sometimes"  . CAD (coronary artery disease)    a. LHC 4/09: pLAD 40, mDx 70-75, pLCx 30, mLCx 50, mRCA 90, EF 60% >> PCI: BMS x2 to RCA;  b. Myoview 8/12: normal  . Cataract    bil cateracts removed  . Chronic anemia   . Chronic diastolic CHF (congestive heart failure) (HCC)    a. Echo 912 - Mild  LVH, EF 55-60%, no RWMA, Gr 1 DD, mild MR, mild LAE, mild RAE, PASP 59 mmHg (mod to severe pulmo HTN)  . Clotting disorder (HCC)    hx of dvt, tia on eliquis  . Colostomy in place [facility])    s/p colostomy takedown 5/16  . Colovaginal fistula    s/p colostomy >> colostomy takedown 5/16  . COPD with chronic bronchitis ([place])   . Depression   . Dysrhythmia    A fib  . Essential hypertension   . GERD (gastroesophageal reflux disease)   . History  of adenomatous polyp of colon   . History of blood transfusion   . History of DVT of lower extremity   . History of hiatal hernia   . History of TIA (transient ischemic attack)    a. Head CT 6/15: small chronic lacunar infarct in thalamus  . Hyperlipidemia   . OA (osteoarthritis)   . Osteoporosis   . Peripheral neuropathy   . Pneumonia   . Pulmonary HTN ([place])    a. PASP on Echo in 9/12:  59 mmHg  . Renal insufficiency   . Sigmoid diverticulosis    s/p sigmoid colectomy  . Stroke ([place])    TIAs  . Wears dentures   . Wears glasses     SURGICAL HISTORY: Past Surgical History:  Procedure Laterality Date  . ABDOMINAL HYSTERECTOMY    . ANTERIOR CERVICAL DECOMP/DISCECTOMY FUSION  01-31-2009   C5 -- 7  . APPENDECTOMY    . Bone spurs Bilateral    Feet  . CARDIOVASCULAR STRESS TEST  02-26-2011   Normal lexiscan no exercise study/  no ischemia/  normal LV function and wall motion, ef 67%  . CARPAL TUNNEL RELEASE Right   . CATARACT EXTRACTION W/ INTRAOCULAR LENS  IMPLANT, BILATERAL Bilateral   . CHOLECYSTECTOMY    . COLOSTOMY N/A 06/02/2014   Procedure: DIVERTING DESCENDING END COLOSTOMY;  Surgeon: Donnie Mesa, MD;  Location: [place];  Service: General;  Laterality: N/A;  . CORONARY ANGIOPLASTY WITH STENT PLACEMENT  11-04-2007  dr bensimhon   BMS x2 to RCA/  mild Non-obstructive disease LAD/  normal LVF  . DILATION AND CURETTAGE OF UTERUS    . ESOPHAGOGASTRODUODENOSCOPY N/A 10/28/2017   Procedure: ESOPHAGOGASTRODUODENOSCOPY (EGD);  Surgeon: Yetta Flock, MD;  Location: [facility] ENDOSCOPY;  Service: Gastroenterology;  Laterality: N/A;  . EVALUATION UNDER ANESTHESIA WITH ANAL FISTULECTOMY N/A 10/13/2014   Procedure: ANAL EXAM UNDER ANESTHESIA ;  Surgeon: [name], MD;  Location: [facility];  Service: General;  Laterality: N/A;  . HAND SURGERY     Tendon repair  . LAPAROSCOPIC SIGMOID COLECTOMY N/A 11/24/2014   Procedure: SIGMOID COLECTOMY AND COLSOTOMY CLOSURE;  Surgeon: Donnie Mesa, MD;  Location: [place];  Service: General;  Laterality: N/A;  . LUMBAR LAMINECTOMY  10-29-2002,  1969   Left  L3 -- 4  decompression  . PARTIAL COLECTOMY N/A 06/02/2014   Procedure: RIGHT PARTIAL COLECTOMY ;  Surgeon: Donnie Mesa, MD;  Location: [place];  Service: General;  Laterality: N/A;  . PATELLECTOMY Right 09/14/2013   Procedure: PATELLECTOMY;  Surgeon: Meredith Pel, MD;  Location: [place];  Service: Orthopedics;  Laterality: Right;  . PROCTOSCOPY N/A 10/13/2014   Procedure: RIDGE PROCTOSCOPY;  Surgeon: [name], MD;  Location: [facility];  Service: General;  Laterality: N/A;  . REVISION TOTAL KNEE ARTHROPLASTY Bilateral right  04-02-2011/  left 1996 & 10-05-1999  . ROTATOR CUFF REPAIR Bilateral   . TONSILLECTOMY    .  TOTAL KNEE ARTHROPLASTY Bilateral left 1994/  right 2000  . TOTAL KNEE REVISION Left 08/02/2015   Procedure: LEFT FEMORAL REVISION;  Surgeon: Gaynelle Arabian, MD;  Location: [facility];  Service: Orthopedics;  Laterality: Left;  . TRANSTHORACIC ECHOCARDIOGRAM  12-01-2009   Grade I diastolic dysfunction/  ef 60%/  moderate MR/  mild TR    I have reviewed the social history and family history with the patient and they are unchanged from previous note.  ALLERGIES:  has No Known Allergies.  MEDICATIONS:  Current Outpatient Medications  Medication Sig Dispense Refill  . acetaminophen (TYLENOL) 325 MG tablet Take 650 mg by mouth every 6 (six) hours as needed for moderate pain.     Marland Kitchen albuterol (PROVENTIL HFA;VENTOLIN HFA) 108 (90 Base) MCG/ACT inhaler Inhale 2 puffs into the lungs every 6 (six) hours as needed for wheezing or shortness of breath. 1 Inhaler 0  . apixaban (ELIQUIS) 5 MG TABS tablet Take 1 tablet (5 mg total) by mouth 2 (two) times daily. 180 tablet 3  . Ascorbic Acid (VITAMIN C) 1000 MG tablet Take 1,000 mg by mouth daily.    . benazepril (LOTENSIN) 40 MG tablet Take 1 tablet (40 mg total) by mouth daily. 90 tablet 4  . benzonatate (TESSALON PERLES) 100 MG  capsule Take 1 capsule (100 mg total) by mouth 3 (three) times daily as needed. 20 capsule 0  . diltiazem (CARDIZEM CD) 120 MG 24 hr capsule Take 1 capsule by mouth as needed when Heart Rate is above 797 and Systolic Blood Pressure is above 100 mm/hg 90 capsule 3  . doxycycline (VIBRAMYCIN) 100 MG capsule Take 1 capsule (100 mg total) by mouth 2 (two) times daily. 14 capsule 0  . esomeprazole (NEXIUM) 40 MG capsule Take 1 capsule (40 mg total) by mouth daily. 90 capsule 4  . ferrous sulfate 325 (65 FE) MG EC tablet Take 325 mg by mouth daily with breakfast.    . fluticasone (FLOVENT HFA) 44 MCG/ACT inhaler Inhale 2 puffs into the lungs 2 (two) times daily. 1 Inhaler 0  . furosemide (LASIX) 20 MG tablet Take 2 tablets (40 mg total) by mouth daily. 180 tablet 3  . gabapentin (NEURONTIN) 600 MG tablet Take 1 tablet (600 mg total) by mouth 2 (two) times daily. 180 tablet 3  . HYDROcodone-acetaminophen (NORCO/VICODIN) 5-325 MG tablet Take 1-2 tablets by mouth every 6 (six) hours as needed for moderate pain. 10 tablet 0  . LORazepam (ATIVAN) 0.5 MG tablet 1 tablet twice daily as needed for anxiety or sleep 60 tablet 3  . nitroGLYCERIN (NITROSTAT) 0.4 MG SL tablet Place 1 tablet (0.4 mg total) under the tongue every 5 (five) minutes as needed for chest pain (x 3 pills). Reported on 09/01/2015 25 tablet 3  . promethazine (PHENERGAN) 12.5 MG tablet Take 12.5 mg by mouth as directed.    . sertraline (ZOLOFT) 50 MG tablet Take 1 tablet (50 mg total) by mouth daily. 90 tablet 3  . Spacer/Aero-Holding Chambers (E-Z SPACER) inhaler Use as instructed 1 each 2  . Tiotropium Bromide-Olodaterol (STIOLTO RESPIMAT) 2.5-2.5 MCG/ACT AERS Inhale 2 puffs into the lungs daily. 1 Inhaler 3  . Tiotropium Bromide-Olodaterol (STIOLTO RESPIMAT) 2.5-2.5 MCG/ACT AERS Inhale 2 puffs into the lungs daily. 2 Inhaler 0  . traZODone (DESYREL) 100 MG tablet TAKE 1/2 (ONE-HALF) TABLET BY MOUTH AT BEDTIME 60 tablet 5  . triamcinolone cream  (KENALOG) 0.1 % Apply 1 application topically 2 (two) times daily as needed (Eczema on legs).  30 g 2  . metoprolol tartrate (LOPRESSOR) 50 MG tablet Take 1.5 tablets (75 mg total) by mouth 2 (two) times daily. 270 tablet 3   No current facility-administered medications for this visit.     PHYSICAL EXAMINATION: ECOG PERFORMANCE STATUS: 2 - Symptomatic, <50% confined to bed  Vitals:   07/20/18 1110  BP: (!) 160/93  Pulse: 84  Resp: 18  Temp: 97.6 F (36.4 C)  SpO2: 100%   Filed Weights   07/20/18 1110  Weight: 190 lb 11.2 oz (86.5 kg)    GENERAL:alert, no distress and comfortable SKIN: skin color, texture, turgor are normal, no rashes or significant lesions EYES: normal, Conjunctiva are pink and non-injected, sclera clear OROPHARYNX:no exudate, no erythema and lips, buccal mucosa, and tongue normal  NECK: supple, thyroid normal size, non-tender, without nodularity LYMPH:  no palpable lymphadenopathy in the cervical, axillary or inguinal LUNGS: clear to auscultation and percussion with normal breathing effort HEART: regular rate & rhythm and no murmurs (+) b/l lower extremity edema, mild ABDOMEN:abdomen soft and normal bowel sounds (+) mild LLQ tenderness  Musculoskeletal:no cyanosis of digits and no clubbing  NEURO: alert & oriented x 3 with fluent speech, no focal motor/sensory deficits  LABORATORY DATA:  I have reviewed the data as listed CBC Latest Ref Rng & Units 07/20/2018 03/26/2018 01/19/2018  WBC 4.0 - 10.5 K/uL 6.7 8.1 6.1  Hemoglobin 12.0 - 15.0 g/dL 13.3 13.8 13.7  Hematocrit 36.0 - 46.0 % 43.3 41.4 42.5  Platelets 150 - 400 K/uL 131(L) 145.0(L) 128(L)     CMP Latest Ref Rng & Units 07/20/2018 04/20/2018 03/26/2018  Glucose 70 - 99 mg/dL 88 90 106(H)  BUN 8 - 23 mg/dL 31(H) 41(H) 39(H)  Creatinine 0.44 - 1.00 mg/dL 1.22(H) 1.33(H) 1.30(H)  Sodium 135 - 145 mmol/L 143 139 141  Potassium 3.5 - 5.1 mmol/L 4.6 4.6 4.6  Chloride 98 - 111 mmol/L 105 101 104  CO2 22 - 32  mmol/L _0 Calcium 8.9 - 10.3 mg/dL 9.6 9.1 9.2  Total Protein 6.5 - 8.1 g/dL 7.9 - -  Total Bilirubin 0.3 - 1.2 mg/dL 0.6 - -  Alkaline Phos 38 - 126 U/L 60 - -  AST 15 - 41 U/L 25 - -  ALT 0 - 44 U/L 18 - -      RADIOGRAPHIC STUDIES: I have personally reviewed the radiological images as listed and agreed with the findings in the report. No results found.   ASSESSMENT & PLAN:  [name] is a 85 y.o. female with   1. Multifocal invasive colon adenocarcinoma, mT4N1M0, stage IIIB, grade 1, MMR normal. -She was diagnosed in 05/2014. She is s/p right hemicolectomy and sigmoid colectomy. Both of her colon cancer were completely surgically resected, however she does have high risk of cancer recurrence in the future due to locally advanced stage. Due to her advanced age, adjuvant chemo was not recommended  -From a cancer standpoint she is clinically doing well. Labs reviewed, CBC WNL except plt at 131K. CEA and CMP is still pending. Her exam was unremarkable with mild LLQ tenderness. I do not have high clinical suspicion for recurrence. -I do not plan to repeat scans. She is 4 years since diagnosis. I will followup  with her in 1 year for last follow up.  -I discussed that her fatigue is a result of her previous surgery, her medical condition and aging.  I encouraged her to remain active. -She does have other chronic  issues that she will continue to follow up with her other physicians.    2. Genetics -Due to her family history of colon cancer, and her daughters multifocal polyps, I recommend her to have a genetic counseling. Her daughter met our genetic counselor before and genetic testing was negative. -She declined genetic testing at this point.  3. Anemia -resolved now  4. CAD, CHF -She will continue follow-up with her cardiologist Dr. Kathlen Mody -I discussed the CT scan findings of atherosclerosis, which involves her aorta and 3 coronary artery vessel, she does have frequent  chest pain. -Pt is still taking Lasix and has minimal leg swelling. -Follow-up with her primary care physician for other medical issues.  5. Hiatal Hernia and abdominal pain -We discussed that she should see Dr. Henrene Pastor about this  -She is on Nexium -Stable on 03/2018 CT chest    6. CKD stage III -Avoid nephrotoxic agents including IV contrast and NSAIDs.  7. Persistent cough and ground-glass changes in lungs  -She had pneumonia in 04/2017 and intermittent cough since then  -Her CT scan of the chest has shown bilateral groundglass changes then, she is also symptomatically with intermittent dyspnea and a cough.  She was hospitalized in April 2019 for CHF, symptoms improved after treatment. -CT Chest from 03/2018 shows less opacity with indication of small airway obstruction and atelectasis. She will have a repeated CT chest in April 2020  -She has been seen by pulmonologist, will continue to follow up    8. Chronic diarrhea, -She goes about 6-10 times a day ever since her colon surgery, secondary to colon surgery -I strongly encouraged her to use imodium to help control this. She is willing to try.     PLAN -From cancer standpoint she is clinically doing well   -Lab and f/u in one year for her last visit with [place]   No problem-specific Assessment & Plan notes found for this encounter.   No orders of the defined types were placed in this encounter.  All questions were answered. The patient knows to call the clinic with any problems, questions or concerns. No barriers to learning was detected. I spent 20 minutes counseling the patient face to face. The total time spent in the appointment was 25 minutes and more than 50% was on counseling and review of test results     Truitt Merle, MD 07/20/2018   I, Joslyn Devon, am acting as scribe for Truitt Merle, MD.   I have reviewed the above documentation for accuracy and completeness, and I agree with the above.

## 2018-07-28 ENCOUNTER — Telehealth: Payer: Self-pay

## 2018-07-28 NOTE — Telephone Encounter (Signed)
-----   Message from Truitt Merle, MD sent at 07/26/2018  7:57 PM EST ----- Please let pt know her lab results, no concerns, thanks   Truitt Merle 07/26/2018

## 2018-07-28 NOTE — Telephone Encounter (Signed)
Spoke with patient regarding lab results.  Per Dr. Burr Medico no concerns.

## 2018-07-31 ENCOUNTER — Ambulatory Visit (INDEPENDENT_AMBULATORY_CARE_PROVIDER_SITE_OTHER): Payer: PRIVATE HEALTH INSURANCE | Admitting: Pulmonary Disease

## 2018-07-31 ENCOUNTER — Encounter: Payer: Self-pay | Admitting: Pulmonary Disease

## 2018-07-31 VITALS — BP 138/80 | HR 60 | Wt 191.8 lb

## 2018-07-31 DIAGNOSIS — J209 Acute bronchitis, unspecified: Secondary | ICD-10-CM

## 2018-07-31 DIAGNOSIS — J449 Chronic obstructive pulmonary disease, unspecified: Secondary | ICD-10-CM

## 2018-07-31 DIAGNOSIS — R062 Wheezing: Secondary | ICD-10-CM

## 2018-07-31 MED ORDER — TIOTROPIUM BROMIDE MONOHYDRATE 2.5 MCG/ACT IN AERS: 2.0000 | INHALATION_SPRAY | Freq: Every day | RESPIRATORY_TRACT | 0 refills | Status: DC

## 2018-07-31 MED ORDER — ALBUTEROL SULFATE HFA 108 (90 BASE) MCG/ACT IN AERS: 2.0000 | INHALATION_SPRAY | Freq: Four times a day (QID) | RESPIRATORY_TRACT | 3 refills | Status: DC | PRN

## 2018-07-31 MED ORDER — AZITHROMYCIN 250 MG PO TABS: ORAL_TABLET | ORAL | 0 refills | Status: DC

## 2018-07-31 NOTE — Patient Instructions (Addendum)
Emphysema START Spiriva 2.5 mcg 2 puffs once a day. Please call us if you feel this medication is helping and we can provide refills. CONTINUE Flovent 44 mcg 2 puffs twice a day REFILL Albuterol 2 puffs every 6 hours as needed for shortness of breath or wheezing  Acute on Chronic Bronchitis START Z-pack  Right upper lung nodule Enlarged mediastinal lymph nodes SCHEDULE CT Chest with contrast in April  Follow-up visit to be scheduled with me after CT scan

## 2018-07-31 NOTE — Progress Notes (Signed)
Synopsis: Referred in 04/2018 for lung nodule  Subjective:   PATIENT ID: [name] GENDER: female DOB: [birth year], MRN: [id number]   HPI  Chief Complaint  Patient presents with  . Follow-up    couldn't tolerate Stiolto (shakiness) - 2 days of nasal congestion and cough (mildly prod)   Mr. [name] is an 85 year old female with hx remote smoke (15 pack-years), asthma?, atrial fibrillation, hx of colon cancer s/p resection, CAD s/p stent, chronic diastolic heart failure who presents to Pulmonary Clinic for follow-up.  Since our last visit 05/01/18, she reports unchanged shortness of breath and productive cough. Stioloto was prescribed however after one week she was self-discontinued the inhaler due extremity shaking. She continues to have nocturnal cough that interrupts her sleep. She does report worsening purulent nasal congestion and productive cough with yellowish-green sputum (normally clear/white) associated with chills and chest congestion that began two days ago. Denies fevers, hemoptysis, chest pain. Denies lower extremity swelling  Former smoker. 7.5 pack-years. Quit in 1969  I have personally reviewed patient's past medical/family/social history, allergies, current medications.  Past Medical History:  Diagnosis Date  . Adenocarcinoma, colon [facility]) Oncologist-- Dr [name]   Multifocal (2) Colon cancer @ ileocecal valve and ascending ( [id number]), Stage IIIB, grade I, MMR normal , 2 of 16 +lymph nodes, negative surgical margins---  06-02-2014  Right hemicolectomy w/ colostomy  . Allergy   . Anxiety   . Asthma    inhaler "sometimes"  . CAD (coronary artery disease)    a. LHC 4/09: pLAD 40, mDx 70-75, pLCx 30, mLCx 50, mRCA 90, EF 60% >> PCI: BMS x2 to RCA;  b. Myoview 8/12: normal  . Cataract    bil cateracts removed  . Chronic anemia   . Chronic diastolic CHF (congestive heart failure) (HCC)    a. Echo 912 - Mild LVH, EF 55-60%, no RWMA, Gr 1 DD, mild MR, mild LAE, mild  RAE, PASP 59 mmHg (mod to severe pulmo HTN)  . Clotting disorder (HCC)    hx of dvt, tia on eliquis  . Colostomy in place [facility])    s/p colostomy takedown 5/16  . Colovaginal fistula    s/p colostomy >> colostomy takedown 5/16  . COPD with chronic bronchitis ([place])   . Depression   . Dysrhythmia    A fib  . Essential hypertension   . GERD (gastroesophageal reflux disease)   . History of adenomatous polyp of colon   . History of blood transfusion   . History of DVT of lower extremity   . History of hiatal hernia   . History of TIA (transient ischemic attack)    a. Head CT 6/15: small chronic lacunar infarct in thalamus  . Hyperlipidemia   . OA (osteoarthritis)   . Osteoporosis   . Peripheral neuropathy   . Pneumonia   . Pulmonary HTN ([place])    a. PASP on Echo in 9/12:  59 mmHg  . Renal insufficiency   . Sigmoid diverticulosis    s/p sigmoid colectomy  . Stroke ([place])    TIAs  . Wears dentures   . Wears glasses      Family History  Problem Relation Age of Onset  . Osteoarthritis Mother   . Heart failure Mother   . Colon cancer Maternal Uncle 2  . Heart Problems Father   . Cancer Sister 38       ? colon cancer   . Kidney cancer Brother 41  .  Esophageal cancer Neg Hx   . Rectal cancer Neg Hx   . Stomach cancer Neg Hx   . Pancreatic cancer Neg Hx      Social History   Socioeconomic History  . Marital status: Widowed    Spouse name: Not on file  . Number of children: Not on file  . Years of education: Not on file  . Highest education level: Not on file  Occupational History  . Not on file  Social Needs  . Financial resource strain: Not on file  . Food insecurity:    Worry: Not on file    Inability: Not on file  . Transportation needs:    Medical: Not on file    Non-medical: Not on file  Tobacco Use  . Smoking status: Former Smoker    Packs/day: 0.50    Years: 15.00    Pack years: 7.50    Types: Cigarettes    Last attempt to quit: 07/16/1967    Years  since quitting: 51.0  . Smokeless tobacco: Never Used  Substance and Sexual Activity  . Alcohol use: No    Alcohol/week: 0.0 standard drinks  . Drug use: No  . Sexual activity: Not on file  Lifestyle  . Physical activity:    Days per week: Not on file    Minutes per session: Not on file  . Stress: Not on file  Relationships  . Social connections:    Talks on phone: Not on file    Gets together: Not on file    Attends religious service: Not on file    Active member of club or organization: Not on file    Attends meetings of clubs or organizations: Not on file    Relationship status: Not on file  . Intimate partner violence:    Fear of current or ex partner: Not on file    Emotionally abused: Not on file    Physically abused: Not on file    Forced sexual activity: Not on file  Other Topics Concern  . Not on file  Social History Narrative   Married, 2 children.  As of November 2015 her husband has been living in a nursing home for tendon after years as he suffers from multiple medical problems.   Patient does not drink alcohol nor does she smoke or chew tobacco products   Right handed   8th grade   1 cup daily     No Known Allergies   Outpatient Medications Prior to Visit  Medication Sig Dispense Refill  . acetaminophen (TYLENOL) 325 MG tablet Take 650 mg by mouth every 6 (six) hours as needed for moderate pain.     Marland Kitchen apixaban (ELIQUIS) 5 MG TABS tablet Take 1 tablet (5 mg total) by mouth 2 (two) times daily. 180 tablet 3  . Ascorbic Acid (VITAMIN C) 1000 MG tablet Take 1,000 mg by mouth daily.    . benazepril (LOTENSIN) 40 MG tablet Take 1 tablet (40 mg total) by mouth daily. 90 tablet 4  . benzonatate (TESSALON PERLES) 100 MG capsule Take 1 capsule (100 mg total) by mouth 3 (three) times daily as needed. 20 capsule 0  . diltiazem (CARDIZEM CD) 120 MG 24 hr capsule Take 1 capsule by mouth as needed when Heart Rate is above 341 and Systolic Blood Pressure is above 100 mm/hg  90 capsule 3  . esomeprazole (NEXIUM) 40 MG capsule Take 1 capsule (40 mg total) by mouth daily. 90 capsule 4  . ferrous  sulfate 325 (65 FE) MG EC tablet Take 325 mg by mouth daily with breakfast.    . fluticasone (FLOVENT HFA) 44 MCG/ACT inhaler Inhale 2 puffs into the lungs 2 (two) times daily. 1 Inhaler 0  . furosemide (LASIX) 20 MG tablet Take 2 tablets (40 mg total) by mouth daily. 180 tablet 3  . gabapentin (NEURONTIN) 600 MG tablet Take 1 tablet (600 mg total) by mouth 2 (two) times daily. 180 tablet 3  . HYDROcodone-acetaminophen (NORCO/VICODIN) 5-325 MG tablet Take 1-2 tablets by mouth every 6 (six) hours as needed for moderate pain. 10 tablet 0  . LORazepam (ATIVAN) 0.5 MG tablet 1 tablet twice daily as needed for anxiety or sleep 60 tablet 3  . nitroGLYCERIN (NITROSTAT) 0.4 MG SL tablet Place 1 tablet (0.4 mg total) under the tongue every 5 (five) minutes as needed for chest pain (x 3 pills). Reported on 09/01/2015 25 tablet 3  . promethazine (PHENERGAN) 12.5 MG tablet Take 12.5 mg by mouth as directed.    . sertraline (ZOLOFT) 50 MG tablet Take 1 tablet (50 mg total) by mouth daily. 90 tablet 3  . Spacer/Aero-Holding Chambers (E-Z SPACER) inhaler Use as instructed 1 each 2  . traZODone (DESYREL) 100 MG tablet TAKE 1/2 (ONE-HALF) TABLET BY MOUTH AT BEDTIME 60 tablet 5  . triamcinolone cream (KENALOG) 0.1 % Apply 1 application topically 2 (two) times daily as needed (Eczema on legs). 30 g 2  . albuterol (PROVENTIL HFA;VENTOLIN HFA) 108 (90 Base) MCG/ACT inhaler Inhale 2 puffs into the lungs every 6 (six) hours as needed for wheezing or shortness of breath. 1 Inhaler 0  . doxycycline (VIBRAMYCIN) 100 MG capsule Take 1 capsule (100 mg total) by mouth 2 (two) times daily. 14 capsule 0  . metoprolol tartrate (LOPRESSOR) 50 MG tablet Take 1.5 tablets (75 mg total) by mouth 2 (two) times daily. 270 tablet 3  . Tiotropium Bromide-Olodaterol (STIOLTO RESPIMAT) 2.5-2.5 MCG/ACT AERS Inhale 2 puffs  into the lungs daily. (Patient not taking: Reported on 07/31/2018) 1 Inhaler 3  . Tiotropium Bromide-Olodaterol (STIOLTO RESPIMAT) 2.5-2.5 MCG/ACT AERS Inhale 2 puffs into the lungs daily. (Patient not taking: Reported on 07/31/2018) 2 Inhaler 0   No facility-administered medications prior to visit.     Review of Systems  Constitutional: Positive for chills. Negative for diaphoresis, fever, malaise/fatigue and weight loss.  HENT: Positive for congestion. Negative for sore throat.   Respiratory: Positive for cough, sputum production, shortness of breath and wheezing. Negative for hemoptysis.   Cardiovascular: Positive for PND. Negative for chest pain, orthopnea and leg swelling.  Gastrointestinal: Negative for abdominal pain, heartburn and nausea.  Genitourinary: Negative for frequency.  Musculoskeletal: Negative for myalgias.  Skin: Negative for rash.  Neurological: Negative for dizziness, weakness and headaches.  Endo/Heme/Allergies: Does not bruise/bleed easily.      Objective:   Vitals:   07/31/18 1029  BP: 138/80  Pulse: 60  SpO2: 98%  Weight: 191 lb 12.8 oz (87 kg)   Physical Exam: General: Well-appearing, no acute distress HENT: [address], AT, OP clear, MMM Eyes: EOMI, no scleral icterus Respiratory: Diminished breath sounds with mild central rhonchi.  No wheezing. Cardiovascular: RRR, -M/R/G, no JVD GI: BS+, soft, nontender Extremities:-Edema,-tenderness Neuro: AAO x4, CNII-XII grossly intact Skin: Intact, no rashes or bruising Psych: Normal mood, normal affect  Chest imaging: CT Chest WO Contrast 04/06/18  IMPRESSION: 1. Less patchy opacity in the lower lobes compared to most recent CT. Areas of mosaic attenuation in the lower lobes currently  is likely indicative of chronic small airways obstruction and atelectasis. No frank consolidation. There are areas of more typical appearing atelectatic change bilaterally. There is a stable 2 mm nodular opacity in the right  lower lobe. No pleural effusion evident. There is mild underlying centrilobular emphysematous change.  2. Mildly prominent lymph node anterior to the carina, likely of reactive etiology. No progression of adenopathy.  3. Aortic atherosclerosis. Foci of great vessel and coronary artery calcification.  4.  Moderate hiatal hernia.  5.  Gallbladder absent.  PFT: None on file  Echo: TTE 10/28/17 Study Conclusions  - Left ventricle: The cavity size was normal. There was severe   focal basal and moderate concentric hypertrophy. Systolic   function was normal. The estimated ejection fraction was in the   range of 55% to 60%. Wall motion was normal; there were no   regional wall motion abnormalities. The study was not technically   sufficient to allow evaluation of LV diastolic dysfunction due to   atrial fibrillation. - Aortic valve: Mildly to moderately calcified annulus. Trileaflet;   normal thickness, mildly calcified leaflets. - Mitral valve: There was moderate regurgitation. - Left atrium: The atrium was severely dilated. - Tricuspid valve: There was moderate regurgitation. - Pulmonic valve: There was trivial regurgitation. - Pulmonary arteries: PA peak pressure: 50 mm Hg (S).  Impressions:  - The right ventricular systolic pressure was increased consistent   with moderate pulmonary hypertension.  I have personally reviewed the above labs, images and tests noted above.    Assessment & Plan:   COPD with chronic bronchitis and emphysema ([place])  Acute bronchitis, unspecified organism  Wheezing - Plan: albuterol (PROVENTIL HFA;VENTOLIN HFA) 108 (90 Base) MCG/ACT inhaler  Discussion: 85 year old female with hx of colon cancer s/p resection Mild mediastinal adenopathy since 07/2016 that is unchanged. We discussed the benefit vs risk of continued surveillance vs invasive diagnostic testing of the mediastinal lymph nodes and after shared decision making, patient wishes to  be conservative and continue surveillance imaging.  Dyspnea is likely multifactorial in setting of known chronic heart failure, moderate PH on echo and possible obstructive lung disease. Patient declined testing including pulmonary function tests. Treatment of these conditions would be diuretics and bronchodilator therapy so further diagnostic testing would not add to treatment plan. She did not tolerate LABA/LAMA. Will trial LAMA alone.  Emphysema START Spiriva 2.5 mcg 2 puffs once a day. Please call us if you feel this medication is helping and we can provide refills. CONTINUE Flovent 44 mcg 2 puffs twice a day REFILL Albuterol 2 puffs every 6 hours as needed for shortness of breath or wheezing  Acute on Chronic Bronchitis START Z-pack  Right upper lung nodule Enlarged mediastinal lymph nodes SCHEDULE CT Chest with contrast in April  Vaccination Influenza vaccine UTD 04/01/18  Follow-up visit to be scheduled with me after CT scan   Andersen Mckiver Rodman Pickle, MD [place] [facility] 08/02/2018 6:45 PM  Personal pager: [phone] If unanswered, please page CCM On-call: [phone]

## 2018-08-02 ENCOUNTER — Encounter: Payer: Self-pay | Admitting: Pulmonary Disease

## 2018-08-25 ENCOUNTER — Other Ambulatory Visit: Payer: Self-pay | Admitting: Internal Medicine

## 2018-08-31 ENCOUNTER — Other Ambulatory Visit: Payer: Self-pay

## 2018-08-31 NOTE — Patient Outreach (Signed)
  [facility] [facility]) Care Management Chronic Special Needs Program   08/31/2018  Name: [name], DOB: 1930-07-05  MRN: [id number]  The client was discussed in today's interdisciplinary care team meeting. The client's individualized care plan was developed based on completed Health Risk Assessment   The following issues were discussed:  Client's needs, Key risk triggers/risk stratification, Care Plan and Issues/barriers to care  Participants present:   Mahlon Gammon, MSN, RN, CCM, CNS    Thea Silversmith, MSN, RN, CCM   Almina Schul RN,BSN,CCM, CDE      Recommendations:  Send education on HTN, heart disease, COPD, A.fib, and safety  Plan:   Plan to send copy of individualized care plan to client Plan to send individualized care plan to provider. Refer to pharmacy for >8 medications and assist with medication cost Chronic Care Management Coordinator will outreach in 1-2 month  Follow-up:  1-2 months  [place], Jackquline Denmark, [place] Management [phone]

## 2018-09-03 ENCOUNTER — Other Ambulatory Visit: Payer: Self-pay | Admitting: Pharmacist

## 2018-09-04 ENCOUNTER — Other Ambulatory Visit: Payer: Self-pay | Admitting: Pharmacy Technician

## 2018-09-04 ENCOUNTER — Ambulatory Visit: Payer: Self-pay | Admitting: Pharmacist

## 2018-09-04 NOTE — Patient Outreach (Addendum)
[facility]) [place]   09/03/2018  Annely Sliva Boutin 1929-11-25 [id number]  Reason for referral: Medication Assistance with inhalers & Eliquis  Referral source: [facility] RN, HTA CSNP Current insurance:[employer]  PMHx includes but not limited to:  HTN, AFib, CHF, GERD, HLD, COPD  Outreach:  Successful telephone call with Ms. Kroboth.  HIPAA identifiers verified. Patient states she is doing well today and denies SOB/edema.  She is agreeable to review medications telephonically.  She states she does need help paying for Eliquis and inhalers.  She is agreeable to pursue patient assistance programs via the manufacturers for the aforementioned medications.   Objective: Lab Results  Component Value Date   CREATININE 1.22 (H) 07/20/2018   CREATININE 1.33 (H) 04/20/2018   CREATININE 1.30 (H) 03/26/2018    Lab Results  Component Value Date   HGBA1C 5.9 (H) 05/31/2014    Lipid Panel     Component Value Date/Time   CHOL 130 01/19/2013 1014   TRIG 87.0 01/19/2013 1014   HDL 33.60 (L) 01/19/2013 1014   CHOLHDL 4 01/19/2013 1014   VLDL 17.4 01/19/2013 1014   LDLCALC 79 01/19/2013 1014    BP Readings from Last 3 Encounters:  07/31/18 138/80  07/20/18 (!) 160/93  06/30/18 134/80    No Known Allergies  Medications Reviewed Today    Reviewed by Lavera Guise, [facility] (Pharmacist) on 09/04/18 at 73  Med List Status: <None>  Medication Order Taking? Sig Documenting Provider Last Dose Status Informant  acetaminophen (TYLENOL) 325 MG tablet [id number] Yes Take 650 mg by mouth every 6 (six) hours as needed for moderate pain.  [provider] Taking Active Self  albuterol (PROVENTIL HFA;VENTOLIN HFA) 108 (90 Base) MCG/ACT inhaler [id number] Yes Inhale 2 puffs into the lungs every 6 (six) hours as needed for wheezing or shortness of breath. [name], MD Taking Active   apixaban (ELIQUIS) 5 MG TABS tablet [id number] Yes Take 1  tablet (5 mg total) by mouth 2 (two) times daily. Marletta Lor, MD Taking Active   Ascorbic Acid (VITAMIN C) 1000 MG tablet [id number] Yes Take 1,000 mg by mouth daily. [provider] Taking Active Self        Discontinued 09/04/18 1431 (Completed Course)   benazepril (LOTENSIN) 40 MG tablet [id number] Yes Take 1 tablet (40 mg total) by mouth daily. Marletta Lor, MD Taking Active         Discontinued 09/04/18 1431 (Completed Course)   diltiazem (CARDIZEM CD) 120 MG 24 hr capsule [id number] Yes Take 1 capsule by mouth as needed when Heart Rate is above 774 and Systolic Blood Pressure is above 100 mm/hg Marletta Lor, MD Taking Active   esomeprazole (NEXIUM) 40 MG capsule [id number] Yes Take 1 capsule (40 mg total) by mouth daily. Marletta Lor, MD Taking Active   ferrous sulfate 325 (65 FE) MG EC tablet [id number] Yes Take 325 mg by mouth daily with breakfast. [provider] Taking Active   fluticasone (FLOVENT HFA) 44 MCG/ACT inhaler [id number] No Inhale 2 puffs into the lungs 2 (two) times daily.  Patient not taking:  Reported on 09/04/2018   Lucretia Kern, DO Not Taking Active   furosemide (LASIX) 20 MG tablet [id number] Yes Take 2 tablets (40 mg total) by mouth daily. [name], Wonda Cheng, MD Taking Active   gabapentin (NEURONTIN) 600 MG tablet [id number] Yes Take 1 tablet (600 mg total) by mouth 2 (two) times daily. Burnice Logan,  Doretha Sou, MD Taking Active   HYDROcodone-acetaminophen (NORCO/VICODIN) 5-325 MG tablet [id number] Yes Take 1-2 tablets by mouth every 6 (six) hours as needed for moderate pain. Nafziger, [name], NP Taking Active   LORazepam (ATIVAN) 0.5 MG tablet [id number] Yes 1 tablet twice daily as needed for anxiety or sleep Marletta Lor, MD Taking Active   metoprolol tartrate (LOPRESSOR) 50 MG tablet [id number]  Take 1.5 tablets (75 mg total) by mouth 2 (two) times daily. Marletta Lor, MD  Expired 05/19/18 2359   nitroGLYCERIN  (NITROSTAT) 0.4 MG SL tablet [id number] Yes Place 1 tablet (0.4 mg total) under the tongue every 5 (five) minutes as needed for chest pain (x 3 pills). Reported on 09/01/2015 Marletta Lor, MD Taking Active Self  promethazine (PHENERGAN) 12.5 MG tablet [id number] No Take 12.5 mg by mouth as directed. [provider] Not Taking Active            Med Note Alford Highland, MELISSA   Mon Apr 06, 2018  8:54 AM)    sertraline (ZOLOFT) 50 MG tablet [id number] Yes Take 1 tablet (50 mg total) by mouth daily. Marletta Lor, MD Taking Active   Spacer/Aero-Holding Chambers (E-Z SPACER) inhaler [id number]  Use as instructed [name], MD  Active   Tiotropium Bromide Monohydrate (SPIRIVA RESPIMAT) 2.5 MCG/ACT AERS [id number] Yes Inhale 2 puffs into the lungs daily. Laurin Coder, MD Taking Active        Patient not taking:       Discontinued 09/04/18 1432 (Change in therapy)   traZODone (DESYREL) 100 MG tablet [id number] Yes TAKE 1/2 (ONE-HALF) TABLET BY MOUTH AT BEDTIME Marletta Lor, MD Taking Active   triamcinolone cream (KENALOG) 0.1 % [id number] Yes Apply 1 application topically 2 (two) times daily as needed (Eczema on legs). Marletta Lor, MD Taking Active Self          Assessment:  Drugs sorted by system:  Neurologic/Psychologic: gabapentin, sertraline  Cardiovascular: Eliquis, benazepril, diltiazem, metoprolol  Pulmonary/Allergy: albuterol, Spiriva  Gastrointestinal: esomeprazole  Pain: hydrocodone/APAP  Vitamins/Minerals/Supplements: iron, vitC  Medication Review Findings:   Medication Assistance Findings:   Extra Help:   [] Already receiving Full Extra Help  [] Already receiving Partial Extra Help  [] Eligible based on reported income and assets  [x] Not Eligible based on reported income and assets  Patient Assistance Programs: 1) Spiriva Respimat made by [employer] o Income requirement met: [x] Yes [] No [] Unknown o Out-of-pocket prescription  expenditure met:    [] Yes [] No  [] Unknown  [x] Not applicable        2)  Albuterol made by [employer] o Income requirement met: [x] Yes [] No  [] Unknown o Out-of-pocket prescription expenditure met:   [] Yes [] No   [] Unknown [x] Not applicable  Plan: I will route patient assistance letter to [place] technician who will coordinate patient assistance program application process for medications listed above.  [facility] pharmacy technician will assist with obtaining all required documents from both patient and provider(s) and submit application(s) once completed.   -Will continue quarterly outreach for HTA-CSNP thereafter.  Will check on Eliquis eligibility at that time.   Regina Eck, PharmD, [place]  [phone]

## 2018-09-04 NOTE — Patient Outreach (Signed)
[name]) Care Management  09/04/2018  [name] [birth year] [id number]                                                  Medication Assistance Referral  Referral From: [facility] RPh Jenne Pane.  Medication/Company: Proventil HFA / [employer] Patient application portion:  Education officer, museum portion: Interoffice Mailed to Dr. Elzie Rings  Medication/Company: Stann Ore Respimat / [employer] Patient application portion:  Mailed Provider application portion: Faxed  to Dr. Elzie Rings  Follow up:  Will follow up with patient in 5-7 business days to confirm application(s) have been received.  [name] [name] [place] Certified Pharmacy Technician [place] Management Direct [phone]

## 2018-09-09 ENCOUNTER — Other Ambulatory Visit: Payer: Self-pay | Admitting: Family Medicine

## 2018-09-09 MED ORDER — LORAZEPAM 0.5 MG PO TABS: ORAL_TABLET | ORAL | 2 refills | Status: DC

## 2018-09-18 ENCOUNTER — Telehealth: Payer: Self-pay | Admitting: Pulmonary Disease

## 2018-09-18 NOTE — Telephone Encounter (Signed)
Called [facility] and spoke to Etter Sjogren (see her telephone note on mediation assistance).  Caryl Pina states patient has not sent in her portion of the application for spiriva medication assistance.  She will follow up with patient on 09/21/18 and asked that we complete provider portion and prescription and fax to [place] at [phone] next week, as Dr. Loanne Drilling is out of the office this week.  Will submit provider info upon JE return on 09/23/18.

## 2018-09-22 ENCOUNTER — Other Ambulatory Visit: Payer: Self-pay

## 2018-09-22 ENCOUNTER — Other Ambulatory Visit: Payer: Self-pay | Admitting: Pharmacy Technician

## 2018-09-22 MED ORDER — TIOTROPIUM BROMIDE MONOHYDRATE 2.5 MCG/ACT IN AERS: 2.0000 | INHALATION_SPRAY | Freq: Every day | RESPIRATORY_TRACT | 3 refills | Status: DC

## 2018-09-22 NOTE — Progress Notes (Signed)
Printed Spiriva 2.5 prescription to send with provider form for medication assistance.  [facility] Caryl Pina) is assisting patient with medication assistance application and asked we send provider form and prescription to her.  Once form is signed by Dr. Loanne Drilling, it will be faxed to [place] at [phone].  Will hold for signature.

## 2018-09-22 NOTE — Patient Outreach (Signed)
[facility]) Care Management  09/22/2018  Agam Tuohy Mabee 1930-03-03 [id number]    Successful call placed to patient regarding patient assistance application(s) for Spiriva and Proventil HFA , HIPAA identifiers verified. Ms. Guerry states that she has already mailed them back to [facility].  Follow up:  Will fax completed application to [employer] once documents have been received.  Will mail completed application to [employer] once documents have been received.  [name] [name] [place] Certified Pharmacy Technician [place] Management Direct Dial:[phone]

## 2018-09-23 ENCOUNTER — Other Ambulatory Visit: Payer: Self-pay

## 2018-09-23 DIAGNOSIS — J449 Chronic obstructive pulmonary disease, unspecified: Secondary | ICD-10-CM

## 2018-09-24 ENCOUNTER — Other Ambulatory Visit: Payer: Self-pay | Admitting: Pharmacy Technician

## 2018-09-24 NOTE — Patient Outreach (Signed)
[place] [facility]) Care Management  09/24/2018  [name] Akram 1930/04/06 [id number]   Received patient portion(s) of patient assistance application for Proventil HFA and Spiriva Respimat. Prepared to mail and fax completed application and required documents into [employer] and [employer]  Will follow up with [employer] in business 14-21 business days to check status of application.  Faxed patient portion into [employer] due to being under the impression providers office has already submitted their portion to company.   [name] [name] [place] Certified Pharmacy Technician [place] Management Direct [phone]

## 2018-10-05 ENCOUNTER — Ambulatory Visit: Payer: Self-pay | Admitting: Pharmacist

## 2018-10-05 ENCOUNTER — Ambulatory Visit: Payer: PPO | Admitting: Cardiovascular Disease

## 2018-10-06 ENCOUNTER — Telehealth: Payer: Self-pay | Admitting: *Deleted

## 2018-10-06 ENCOUNTER — Ambulatory Visit: Payer: Self-pay | Admitting: Pharmacist

## 2018-10-06 ENCOUNTER — Other Ambulatory Visit: Payer: Self-pay

## 2018-10-06 ENCOUNTER — Encounter: Payer: Self-pay | Admitting: Adult Health

## 2018-10-06 ENCOUNTER — Ambulatory Visit (INDEPENDENT_AMBULATORY_CARE_PROVIDER_SITE_OTHER): Payer: HMO | Admitting: Adult Health

## 2018-10-06 DIAGNOSIS — L03012 Cellulitis of left finger: Secondary | ICD-10-CM | POA: Diagnosis not present

## 2018-10-06 DIAGNOSIS — M109 Gout, unspecified: Secondary | ICD-10-CM

## 2018-10-06 MED ORDER — DOXYCYCLINE HYCLATE 100 MG PO CAPS: 100.0000 mg | ORAL_CAPSULE | Freq: Two times a day (BID) | ORAL | 0 refills | Status: DC

## 2018-10-06 MED ORDER — METHYLPREDNISOLONE 4 MG PO TBPK: ORAL_TABLET | ORAL | 0 refills | Status: DC

## 2018-10-06 NOTE — Telephone Encounter (Signed)
Will you be able to do a telephone visit with the pt or will you need to see her?

## 2018-10-06 NOTE — Progress Notes (Signed)
Virtual Visit via Telephone Note  I connected with [name] on 10/06/18 at  4:00 PM EDT by telephone and verified that I am speaking with the correct person using two identifiers.   I discussed the limitations, risks, security and privacy concerns of performing an evaluation and management service by telephone and the availability of in person appointments. I also discussed with the patient that there Schwartzman be a patient responsible charge related to this service. The patient expressed understanding and agreed to proceed.  Location patient: home Location provider: work or home office Participants present for the call: patient, provider Patient did not have a visit in the prior 7 days to address this/these issue(s).   History of Present Illness: Is an 85 year old female who  has a past medical history of Adenocarcinoma, colon ([place]) (Oncologist-- Dr Truitt Merle), Allergy, Anxiety, Asthma, CAD (coronary artery disease), Cataract, Chronic anemia, Chronic diastolic CHF (congestive heart failure) ([place]), Clotting disorder ([place]), Colostomy in place [facility]), Colovaginal fistula, COPD with chronic bronchitis ([place]), Depression, Dysrhythmia, Essential hypertension, GERD (gastroesophageal reflux disease), History of adenomatous polyp of colon, History of blood transfusion, History of DVT of lower extremity, History of hiatal hernia, History of TIA (transient ischemic attack), Hyperlipidemia, OA (osteoarthritis), Osteoporosis, Peripheral neuropathy, Pneumonia, Pulmonary HTN ([place]), Renal insufficiency, Sigmoid diverticulosis, Stroke ([place]), Wears dentures, and Wears glasses.   She is being evaluated for an acute issue.  She reports that she first noticed redness, warmth, swelling, and pain to the first joint of the ring finger on the left hand 3 days ago.  She reports is the last few days have progressed and redness is started to spread up to her second joint in that finger.  He denies fevers, chills, fatigue, or feeling  acutely ill.  She denies trauma to the finger.  She does not notice any pus pockets around her fingernail.  She does have a history of gout does not believe she is ever experienced a gout flare in her hands.  Observations/Objective: Patient sounds cheerful and well on the phone. I do not appreciate any SOB. Speech and thought processing are grossly intact. Patient reported vitals:  Assessment and Plan: Discussed treatment options with the patient.  Was decided on to cover for both like acute gout flare as well as cellulitis.  Will prescribe a 10-day course of doxycycline and a Medrol Dosepak.  Follow Up Instructions: -He was advised of strict follow-up precautions.  If no improvement in the next 2 or 3 days and then she is to follow-up in the office sooner if emptying is worsened  I did not refer this patient for an OV in the next 24 hours for this/these issue(s).  I discussed the assessment and treatment plan with the patient. The patient was provided an opportunity to ask questions and all were answered. The patient agreed with the plan and demonstrated an understanding of the instructions.   The patient was advised to call back or seek an in-person evaluation if the symptoms worsen or if the condition fails to improve as anticipated.  I provided 22 minutes of non-face-to-face time during this encounter.   Whitney Peng, NP

## 2018-10-06 NOTE — Telephone Encounter (Signed)
We can try a phone call

## 2018-10-06 NOTE — Telephone Encounter (Signed)
Copied from [place] [id number]. Topic: Appointment Scheduling - Scheduling Inquiry for Clinic >> Oct 05, 2018 12:22 PM Waylan Rocher, [place] L wrote: Reason for CRM: Left ring finger is red, hot and painful. Patient wants to know if she can get a phone visit to avoid coming into the office? >> Oct 05, 2018  2:17 PM Cox, Melburn Hake, CMA wrote: [name] out of office today.    Pt of [employer]

## 2018-10-06 NOTE — Telephone Encounter (Signed)
Spoke to the pt.  She will speak to [facility] today by telephone at 4 PM.  States her finger is red, painful and swollen.  No further action required.

## 2018-10-09 ENCOUNTER — Other Ambulatory Visit: Payer: Self-pay | Admitting: Pharmacy Technician

## 2018-10-09 ENCOUNTER — Ambulatory Visit: Payer: PRIVATE HEALTH INSURANCE | Admitting: Adult Health

## 2018-10-09 NOTE — Patient Outreach (Signed)
[facility]) Care Management  10/09/2018  Laniyah Rosenwald Mittelman 03-20-1930 [id number]    Follow up call placed to [employer] regarding patient assistance application(s) for Proventil HFA , Ezzard Flax states that program is running behind, application has not been recieved as of yet.   Follow up call placed to [employer] regarding patient assistance application(s) for Spiriva Respimat , Demetra states that patient has been temporarily approved, proof of LIS status is required. Patient will receive 1-3 month supply of medication while additional documents are obtained.  Follow up:  Will follow up with [employer] to obtain proof of copay and submit to company.  [name] [name] [place] Certified Pharmacy Technician [place] Management Direct [phone]

## 2018-10-20 ENCOUNTER — Other Ambulatory Visit: Payer: Self-pay

## 2018-10-20 ENCOUNTER — Ambulatory Visit (INDEPENDENT_AMBULATORY_CARE_PROVIDER_SITE_OTHER): Payer: HMO | Admitting: Adult Health

## 2018-10-20 ENCOUNTER — Encounter: Payer: Self-pay | Admitting: Adult Health

## 2018-10-20 DIAGNOSIS — M109 Gout, unspecified: Secondary | ICD-10-CM | POA: Diagnosis not present

## 2018-10-20 DIAGNOSIS — L03012 Cellulitis of left finger: Secondary | ICD-10-CM | POA: Diagnosis not present

## 2018-10-20 DIAGNOSIS — B370 Candidal stomatitis: Secondary | ICD-10-CM | POA: Diagnosis not present

## 2018-10-20 DIAGNOSIS — R5383 Other fatigue: Secondary | ICD-10-CM | POA: Diagnosis not present

## 2018-10-20 MED ORDER — CEPHALEXIN 500 MG PO CAPS: 500.0000 mg | ORAL_CAPSULE | Freq: Three times a day (TID) | ORAL | 0 refills | Status: AC

## 2018-10-20 MED ORDER — COLCHICINE 0.6 MG PO TABS: 0.6000 mg | ORAL_TABLET | Freq: Every day | ORAL | 2 refills | Status: DC

## 2018-10-20 MED ORDER — NYSTATIN 100000 UNIT/ML MT SUSP: 5.0000 mL | Freq: Four times a day (QID) | OROMUCOSAL | 3 refills | Status: DC

## 2018-10-20 NOTE — Progress Notes (Signed)
Virtual Visit via Video Note  I connected with [name] on 10/20/18 at  9:00 AM EDT by a video enabled telemedicine application and verified that I am speaking with the correct person using two identifiers.  Location patient: home Location provider:work or home office Persons participating in the virtual visit: patient, provider  I discussed the limitations of evaluation and management by telemedicine and the availability of in person appointments. The patient expressed understanding and agreed to proceed.   HPI: 85 year old female who  has a past medical history of Adenocarcinoma, colon ([place]) (Oncologist-- Dr Truitt Merle), Allergy, Anxiety, Asthma, CAD (coronary artery disease), Cataract, Chronic anemia, Chronic diastolic CHF (congestive heart failure) ([place]), Clotting disorder ([place]), Colostomy in place [facility]), Colovaginal fistula, COPD with chronic bronchitis ([place]), Depression, Dysrhythmia, Essential hypertension, GERD (gastroesophageal reflux disease), History of adenomatous polyp of colon, History of blood transfusion, History of DVT of lower extremity, History of hiatal hernia, History of TIA (transient ischemic attack), Hyperlipidemia, OA (osteoarthritis), Osteoporosis, Peripheral neuropathy, Pneumonia, Pulmonary HTN ([place]), Renal insufficiency, Sigmoid diverticulosis, Stroke ([place]), Wears dentures, and Wears glasses.  She is being evaluated today via video visit for multiple issues  1.  Cellulitis of left ring finger.  Was originally seen for this on 10/06/2018 after she reported redness, warmth, and swelling with pain to the first joint of the ring finger on the left hand 3 days prior.  She reported at this time that she had some spreading up to her second joint in that finger.  She was started on doxy and prednisone for suspected cellulitis.  She reports that this improved about 50% but that she continues to have redness and warmth to the first joint.  She does not endorse pain with bending the  finger.  She has not noticed any pus pockets.  Redness, warmth, and pain are localized to first joint at this period in time.  2.  Pain in mouth   She reports that since finishing antibiotics that she is developed a "burning sensation in her throat, tongue, and lips.  She reports that her tongue is red and she is experiencing burning she swallows.  She does feel as though her lips are slightly swollen and numb burning as well.  She also reports that she feels as though the skin on her lips is peeling off.  She denies any fevers or chills  3.  Acute gout flare.  Reports pain in right great toe with redness and warmth that started approximately 5 days ago.  She had a prescription from her devious PCP of colchicine and she started this the day after she noticed.  Reports that it feels like a typical gout flare.  She has noticed significant improvement since starting colchicine.  She has approximately 5 more days of colchicine.  Pain is worse with movement and weightbearing.  4. Weakness and Fatigue -she reports this is been going on for the last "few weeks".  She is unable to explain except that she feels weak and tired all the time.  She often feels as though she is going to fall [place] when in shower and when dressing.  She currently does have someone coming over to help her perform these activities.  She is eating and drinking okay and is staying well-hydrated.  She is getting lightheaded and dizzy at times especially with changes in position.  Feels as though her symptoms are getting worse.  She has been taking her iron supplement as well as vitamin D supplement.  Denies any bleeding shortness of breath past baseline, or chest pain  5.  She would also like a note for her VA benefits stating that she could use some help around the house especially with bathing and dressing.  She does not want to go through an agency at this time and if the [place] would help her pay for this she would much rather go that  route.    ROS: See pertinent positives and negatives per HPI.  Past Medical History:  Diagnosis Date  . Adenocarcinoma, colon [facility]) Oncologist-- Dr Truitt Merle   Multifocal (2) Colon cancer @ ileocecal valve and ascending ( [id number]), Stage IIIB, grade I, MMR normal , 2 of 16 +lymph nodes, negative surgical margins---  06-02-2014  Right hemicolectomy w/ colostomy  . Allergy   . Anxiety   . Asthma    inhaler "sometimes"  . CAD (coronary artery disease)    a. LHC 4/09: pLAD 40, mDx 70-75, pLCx 30, mLCx 50, mRCA 90, EF 60% >> PCI: BMS x2 to RCA;  b. Myoview 8/12: normal  . Cataract    bil cateracts removed  . Chronic anemia   . Chronic diastolic CHF (congestive heart failure) (HCC)    a. Echo 912 - Mild LVH, EF 55-60%, no RWMA, Gr 1 DD, mild MR, mild LAE, mild RAE, PASP 59 mmHg (mod to severe pulmo HTN)  . Clotting disorder (HCC)    hx of dvt, tia on eliquis  . Colostomy in place [facility])    s/p colostomy takedown 5/16  . Colovaginal fistula    s/p colostomy >> colostomy takedown 5/16  . COPD with chronic bronchitis ([place])   . Depression   . Dysrhythmia    A fib  . Essential hypertension   . GERD (gastroesophageal reflux disease)   . History of adenomatous polyp of colon   . History of blood transfusion   . History of DVT of lower extremity   . History of hiatal hernia   . History of TIA (transient ischemic attack)    a. Head CT 6/15: small chronic lacunar infarct in thalamus  . Hyperlipidemia   . OA (osteoarthritis)   . Osteoporosis   . Peripheral neuropathy   . Pneumonia   . Pulmonary HTN ([place])    a. PASP on Echo in 9/12:  59 mmHg  . Renal insufficiency   . Sigmoid diverticulosis    s/p sigmoid colectomy  . Stroke ([place])    TIAs  . Wears dentures   . Wears glasses     Past Surgical History:  Procedure Laterality Date  . ABDOMINAL HYSTERECTOMY    . ANTERIOR CERVICAL DECOMP/DISCECTOMY FUSION  01-31-2009   C5 -- 7  . APPENDECTOMY    . Bone spurs Bilateral    Feet  .  CARDIOVASCULAR STRESS TEST  02-26-2011   Normal lexiscan no exercise study/  no ischemia/  normal LV function and wall motion, ef 67%  . CARPAL TUNNEL RELEASE Right   . CATARACT EXTRACTION W/ INTRAOCULAR LENS  IMPLANT, BILATERAL Bilateral   . CHOLECYSTECTOMY    . COLOSTOMY N/A 06/02/2014   Procedure: DIVERTING DESCENDING END COLOSTOMY;  Surgeon: Donnie Mesa, MD;  Location: [facility];  Service: General;  Laterality: N/A;  . CORONARY ANGIOPLASTY WITH STENT PLACEMENT  11-04-2007  dr bensimhon   BMS x2 to RCA/  mild Non-obstructive disease LAD/  normal LVF  . DILATION AND CURETTAGE OF UTERUS    . ESOPHAGOGASTRODUODENOSCOPY N/A 10/28/2017   Procedure: ESOPHAGOGASTRODUODENOSCOPY (EGD);  Surgeon: Yetta Flock, MD;  Location: [facility] ENDOSCOPY;  Service: Gastroenterology;  Laterality: N/A;  . EVALUATION UNDER ANESTHESIA WITH ANAL FISTULECTOMY N/A 10/13/2014   Procedure: ANAL EXAM UNDER ANESTHESIA ;  Surgeon: [name], MD;  Location: [facility];  Service: General;  Laterality: N/A;  . HAND SURGERY     Tendon repair  . LAPAROSCOPIC SIGMOID COLECTOMY N/A 11/24/2014   Procedure: SIGMOID COLECTOMY AND COLSOTOMY CLOSURE;  Surgeon: Donnie Mesa, MD;  Location: [facility];  Service: General;  Laterality: N/A;  . LUMBAR LAMINECTOMY  10-29-2002,  1969   Left  L3 -- 4  decompression  . PARTIAL COLECTOMY N/A 06/02/2014   Procedure: RIGHT PARTIAL COLECTOMY ;  Surgeon: Donnie Mesa, MD;  Location: [facility];  Service: General;  Laterality: N/A;  . PATELLECTOMY Right 09/14/2013   Procedure: PATELLECTOMY;  Surgeon: Meredith Pel, MD;  Location: [facility];  Service: Orthopedics;  Laterality: Right;  . PROCTOSCOPY N/A 10/13/2014   Procedure: RIDGE PROCTOSCOPY;  Surgeon: [name], MD;  [facility];  Service: General;  Laterality: N/A;  . REVISION TOTAL KNEE ARTHROPLASTY Bilateral right  04-02-2011/  left 1996 & 10-05-1999  . ROTATOR CUFF REPAIR Bilateral   . TONSILLECTOMY    . TOTAL KNEE ARTHROPLASTY Bilateral  left 1994/  right 2000  . TOTAL KNEE REVISION Left 08/02/2015   Procedure: LEFT FEMORAL REVISION;  Surgeon: Gaynelle Arabian, MD;  [facility];  Service: Orthopedics;  Laterality: Left;  . TRANSTHORACIC ECHOCARDIOGRAM  12-01-2009   Grade I diastolic dysfunction/  ef 60%/  moderate MR/  mild TR    Family History  Problem Relation Age of Onset  . Osteoarthritis Mother   . Heart failure Mother   . Colon cancer Maternal Uncle 23  . Heart Problems Father   . Cancer Sister 12       ? colon cancer   . Kidney cancer Brother 69  . Esophageal cancer Neg Hx   . Rectal cancer Neg Hx   . Stomach cancer Neg Hx   . Pancreatic cancer Neg Hx       Current Outpatient Medications:  .  acetaminophen (TYLENOL) 325 MG tablet, Take 650 mg by mouth every 6 (six) hours as needed for moderate pain. , Disp: , Rfl:  .  albuterol (PROVENTIL HFA;VENTOLIN HFA) 108 (90 Base) MCG/ACT inhaler, Inhale 2 puffs into the lungs every 6 (six) hours as needed for wheezing or shortness of breath., Disp: 1 Inhaler, Rfl: 3 .  apixaban (ELIQUIS) 5 MG TABS tablet, Take 1 tablet (5 mg total) by mouth 2 (two) times daily., Disp: 180 tablet, Rfl: 3 .  Ascorbic Acid (VITAMIN C) 1000 MG tablet, Take 1,000 mg by mouth daily., Disp: , Rfl:  .  benazepril (LOTENSIN) 40 MG tablet, Take 1 tablet (40 mg total) by mouth daily., Disp: 90 tablet, Rfl: 4 .  cephALEXin (KEFLEX) 500 MG capsule, Take 1 capsule (500 mg total) by mouth 3 (three) times daily for 10 days., Disp: 30 capsule, Rfl: 0 .  colchicine 0.6 MG tablet, Take 1 tablet (0.6 mg total) by mouth daily., Disp: 20 tablet, Rfl: 2 .  diltiazem (CARDIZEM CD) 120 MG 24 hr capsule, Take 1 capsule by mouth as needed when Heart Rate is above 045 and Systolic Blood Pressure is above 100 mm/hg, Disp: 90 capsule, Rfl: 3 .  doxycycline (VIBRAMYCIN) 100 MG capsule, Take 1 capsule (100 mg total) by mouth 2 (two) times daily., Disp: 20 capsule, Rfl: 0 .  esomeprazole ([place])  40 MG capsule, Take  1 capsule (40 mg total) by mouth daily., Disp: 90 capsule, Rfl: 4 .  ferrous sulfate 325 (65 FE) MG EC tablet, Take 325 mg by mouth daily with breakfast., Disp: , Rfl:  .  fluticasone (FLOVENT HFA) 44 MCG/ACT inhaler, Inhale 2 puffs into the lungs 2 (two) times daily. (Patient not taking: Reported on 09/04/2018), Disp: 1 Inhaler, Rfl: 0 .  furosemide (LASIX) 20 MG tablet, Take 2 tablets (40 mg total) by mouth daily., Disp: 180 tablet, Rfl: 3 .  gabapentin (NEURONTIN) 600 MG tablet, Take 1 tablet (600 mg total) by mouth 2 (two) times daily., Disp: 180 tablet, Rfl: 3 .  HYDROcodone-acetaminophen (NORCO/VICODIN) 5-325 MG tablet, Take 1-2 tablets by mouth every 6 (six) hours as needed for moderate pain., Disp: 10 tablet, Rfl: 0 .  LORazepam (ATIVAN) 0.5 MG tablet, 1 tablet twice daily as needed for anxiety or sleep, Disp: 60 tablet, Rfl: 2 .  methylPREDNISolone (MEDROL DOSEPAK) 4 MG TBPK tablet, Take as directed, Disp: 21 tablet, Rfl: 0 .  metoprolol tartrate (LOPRESSOR) 50 MG tablet, Take 1.5 tablets (75 mg total) by mouth 2 (two) times daily., Disp: 270 tablet, Rfl: 3 .  nitroGLYCERIN (NITROSTAT) 0.4 MG SL tablet, Place 1 tablet (0.4 mg total) under the tongue every 5 (five) minutes as needed for chest pain (x 3 pills). Reported on 09/01/2015, Disp: 25 tablet, Rfl: 3 .  nystatin (MYCOSTATIN) 100000 UNIT/ML suspension, Take 5 mLs (500,000 Units total) by mouth 4 (four) times daily. Gargle and spit, Disp: 60 mL, Rfl: 3 .  promethazine (PHENERGAN) 12.5 MG tablet, Take 12.5 mg by mouth as directed., Disp: , Rfl:  .  sertraline (ZOLOFT) 50 MG tablet, Take 1 tablet (50 mg total) by mouth daily., Disp: 90 tablet, Rfl: 3 .  Spacer/Aero-Holding Chambers (E-Z SPACER) inhaler, Use as instructed, Disp: 1 each, Rfl: 2 .  Tiotropium Bromide Monohydrate (SPIRIVA RESPIMAT) 2.5 MCG/ACT AERS, Inhale 2 puffs into the lungs daily., Disp: 2 Inhaler, Rfl: 0 .  Tiotropium Bromide Monohydrate (SPIRIVA RESPIMAT) 2.5 MCG/ACT AERS,  Inhale 2 puffs into the lungs daily., Disp: 3 Inhaler, Rfl: 3 .  traZODone (DESYREL) 100 MG tablet, TAKE 1/2 (ONE-HALF) TABLET BY MOUTH AT BEDTIME, Disp: 60 tablet, Rfl: 5 .  triamcinolone cream (KENALOG) 0.1 %, Apply 1 application topically 2 (two) times daily as needed (Eczema on legs)., Disp: 30 g, Rfl: 2  EXAM:  VITALS per patient if applicable:  GENERAL: alert, oriented, appears well and in no acute distress  HEENT: atraumatic, conjunttiva clear, no obvious abnormalities on inspection of external nose and ears  NECK: normal movements of the head and neck  LUNGS: on inspection no signs of respiratory distress, breathing rate appears normal, no obvious gross SOB, gasping or wheezing  CV: no obvious cyanosis  MS: moves all visible extremities without noticeable abnormality  PSYCH/NEURO: pleasant and cooperative, no obvious depression or anxiety, speech and thought processing grossly intact  ASSESSMENT AND PLAN:  Discussed the following assessment and plan:  -Send in Keflex for cellulitis of left hand.  We will also send in an additional course of colchicine for her acute gout flare.  From the sounds of it appears as though she has thrush, probably from antibiotics she was on for cellulitis.  Will send in nystatin solution.  The fatigue aspect she would rather not, the house at this point in time for labs.  She is okay with trialing a vitamin B12 supplement at home.  She will follow-up  with me if her symptoms do not start to resolve or continue to worsen.  Cellulitis of finger of left hand - Plan: cephALEXin (KEFLEX) 500 MG capsule  Acute gout involving toe of right foot, unspecified cause - Plan: colchicine 0.6 MG tablet  Thrush - Plan: nystatin (MYCOSTATIN) 100000 UNIT/ML suspension  Fatigue, unspecified type     I discussed the assessment and treatment plan with the patient. The patient was provided an opportunity to ask questions and all were answered. The patient agreed  with the plan and demonstrated an understanding of the instructions.   The patient was advised to call back or seek an in-person evaluation if the symptoms worsen or if the condition fails to improve as anticipated.  Greater than 50% of 45 minutes was spent face to face on virtual visit  with patient, counseling and/or coordinating care on issues relating to fatigue and weakness.     [name], NP

## 2018-10-21 ENCOUNTER — Other Ambulatory Visit: Payer: Self-pay

## 2018-10-21 NOTE — Patient Outreach (Signed)
  [place] [facility]) Care Management Chronic Special Needs Program  10/21/2018  Name: [name] DOB: 01-29-1930  MRN: [id number]  Ms. [name] is enrolled in a chronic special needs plan for Diabetes. Client called to follow up [place] client for call to nurse advise line.  Client saw her provider yesterday. No answer and unable to leave message Plan to make outreach call in tomorrow as scheduled for regular follow up call Chronic care management coordinator will attempt outreach in one day.   Peter Garter RN, Jackquline Denmark, CDE Chronic Care Management Coordinator [place] [facility] [phone]

## 2018-10-21 NOTE — Addendum Note (Signed)
Addended by: Apolinar Junes on: 10/21/2018 09:04 AM   Modules accepted: Level of Service

## 2018-10-22 ENCOUNTER — Telehealth: Payer: Self-pay | Admitting: *Deleted

## 2018-10-22 ENCOUNTER — Other Ambulatory Visit: Payer: Self-pay | Admitting: Pharmacy Technician

## 2018-10-22 ENCOUNTER — Other Ambulatory Visit: Payer: Self-pay

## 2018-10-22 NOTE — Patient Outreach (Signed)
[place] [facility]) Care Management  10/22/2018  [name] Oct 12, 1929 [id number]    Follow up call placed to [employer] regarding patient assistance application(s) for Proventil HFA , Lonn [place] states application was received and attestation form was mailed out to patient on 3/28.    Successful call placed to patient regarding patient assistance receipt of attestation form from [employer] for Proventil HFA, HIPAA identifiers verified. [name] states that she has not received attestation from as of yet. Requested that she contact me when she does so that I [place] assist her with filling it out.   [name] confirms that she received her temporary 90 day supply of Spiriva Respimat.  Follow up:  Will follow up with patient in 3-5 business days if call has not been returned.  [name] [name] [place] Certified Pharmacy Technician [place] Management Direct Dial:[phone]

## 2018-10-22 NOTE — Patient Outreach (Signed)
  [place] [facility]) Care Management Chronic Special Needs Program  10/22/2018  Name: [name] DOB: 01/01/1930  MRN: [id number]  Ms. [name] is enrolled in a chronic special needs plan for Heart Failure. Client called with no answer No answer and HIPAA compliant message left. Plan for 3rd outreach call in one week Chronic care management coordinator will attempt outreach in one week .   Peter Garter RN, Jackquline Denmark, CDE Chronic Care Management Coordinator [place] [facility] Management [phone]

## 2018-10-22 NOTE — Telephone Encounter (Signed)
Called [name] made her aware at this current time we are postponing her CT appt due to [place]. We are reschedule in 2-3 months. [name] is aware.   FW: CT Urgency  Received: Yesterday  Message Contents  Vivia Ewing, LPN  Jesselee Poth, [place], [place]        Per NP Geraldo Pitter okay to r/s 2-3 months out.

## 2018-10-28 ENCOUNTER — Other Ambulatory Visit: Payer: Self-pay

## 2018-10-28 NOTE — Patient Outreach (Signed)
  [facility]) Care Management Chronic Special Needs Program  10/28/2018  Name: [name] DOB: [birth year]  MRN: [id number]  [name] is enrolled in a chronic special needs plan for Heart Failure. Chronic Care Management Coordinator telephoned client to review health risk assessment and to develop individualized care plan.  Introduced the chronic care management program, importance of client participation, and taking their care plan to all provider appointments and inpatient facilities.  Reviewed the transition of care process and possible referral to community care management.  Subjective: Client states that her finger infection is getting better.  Denies any recent falls.  States she uses her walker at all times and wears her alert button.  States she sometimes does not weigh daily as she has gotten out of the habit.  States she knows to call her doctor if her weight goes up, it she has increased swelling or increase SOB.  States she is staying isolated and knows when to call her provider if needed.  States her daughter is bringing her groceries.  Goals Addressed            This Visit's Progress   . Client understands the importance of follow-up with providers by attending scheduled visits   On track   . Client will not report change from baseline and no repeated symptoms of stroke with in the next 6 months    On track   . Client will report abillity to obtain Medications within 3 months   On track    Continue to work with Personal assistant    . Client will report no fall or injuries in the next 6 months.   On track   . Client will report no worsening of symptoms of Atrial Fibrillation within the next 6 months   On track   . Client will report no worsening of symptoms related to heart disease within the next 6 months    On track   . Client will verbalize knowledge of chronic lung disease as evidenced by no ED visits or Inpatient stays  related to chronic lung disease    On track   . Client will verbalize knowledge of self management of Hypertension as evidences by BP reading of 140/90 or less; or as defined by provider   On track   . Decrease inpatient Heart Failure admissions/ readmissions with in the year   On track   . Decrease the use of hospital emergency department related to heart failure within the next year   On track   . Maintain timely refills of Heart Failure medication as prescribed within the year    On track   . Obtain annual  Lipid Profile, LDL-C   On track   . Visit Primary Care Provider or Cardiologist at least 2 times per year   On track    Reviewed importance of weighing daily and to record her weights.  Reviewed HF action plan Client is working with pharmacy assistance at this time  Discussed COVID19 cause, symptoms, precautions (social distancing, stay at home order, hand washing), confirmed client knows how to contact provider. Plan:  Send successful outreach letter with a copy of their individualized care plan and Send individual care plan to provider  Chronic care management coordination will outreach in:  6 Months  Client working with Pharmacy assistance   Peter Garter RN, [facility], [place] Management Coordinator [place] Management [phone]

## 2018-11-03 ENCOUNTER — Inpatient Hospital Stay: Admission: RE | Admit: 2018-11-03 | Payer: PRIVATE HEALTH INSURANCE | Source: Ambulatory Visit

## 2018-11-11 ENCOUNTER — Ambulatory Visit: Payer: PRIVATE HEALTH INSURANCE | Admitting: Pulmonary Disease

## 2018-11-18 DIAGNOSIS — L570 Actinic keratosis: Secondary | ICD-10-CM | POA: Diagnosis not present

## 2018-11-18 DIAGNOSIS — X32XXXD Exposure to sunlight, subsequent encounter: Secondary | ICD-10-CM | POA: Diagnosis not present

## 2018-11-30 ENCOUNTER — Ambulatory Visit: Payer: Self-pay | Admitting: Pharmacist

## 2018-11-30 ENCOUNTER — Other Ambulatory Visit: Payer: Self-pay | Admitting: Pharmacist

## 2018-11-30 NOTE — Patient Outreach (Signed)
[facility]) Care Management  [place]  11/30/2018  [name] May 25, 1930 [id number]   Reason for referral: HTA CSNP/medication management  Referral source: [employer] C-SNP Care Manager with [facility] Current insurance: [employer] C-SNP  Reason for call: medication management/assistance-->quarterly outreach  Outreach:  Unsuccessful telephone call attempt #1 to patient.   HIPAA compliant voicemail left requesting a return call  Plan:  -I will make another outreach attempt to patient within 3-4 business days.     Regina Eck, PharmD, [place]  [phone]

## 2018-12-01 ENCOUNTER — Ambulatory Visit: Payer: HMO | Admitting: Adult Health

## 2018-12-03 ENCOUNTER — Other Ambulatory Visit: Payer: Self-pay | Admitting: Pharmacy Technician

## 2018-12-03 ENCOUNTER — Ambulatory Visit: Payer: Self-pay | Admitting: Pharmacist

## 2018-12-03 ENCOUNTER — Other Ambulatory Visit: Payer: Self-pay | Admitting: Pharmacist

## 2018-12-03 NOTE — Patient Outreach (Signed)
[facility]) [place]   12/03/2018  Alencia Gordon Aspinall February 26, 1930 [id number]  Reason for referral: HTA C-SNP patient (assisting with medication management & assistance-->inahlers, apixaban)  Referral source: [employer] C-SNP Care Manager with [facility] Current insurance: [employer] C-SNP  PMHx includes but not limited to:  COPD, Afib, HTN, CHF (HF-pEF 55-60%), GERD  Outreach:  Successful telephone call with Ms. Righetti.  HIPAA identifiers verified.  Call placed to [place] regarding [place].  Patient just paid $45 copay for Eliquis 30-day supply, however there was not a prescription on file for Spiriva.  Will mail patient application for PAP/Eliquis.  She has likely met OOP for BMS. Per CPhT Etter Sjogren, patient has Level 4 extra help/LIS.  Her anticipated copay for Spiriva is $5.  Caryl Pina requested RX be sent to [facility] to see if this copay is correct.    Objective: Lab Results  Component Value Date   CREATININE 1.22 (H) 07/20/2018   CREATININE 1.33 (H) 04/20/2018   CREATININE 1.30 (H) 03/26/2018    Lab Results  Component Value Date   HGBA1C 5.9 (H) 05/31/2014    Lipid Panel     Component Value Date/Time   CHOL 130 01/19/2013 1014   TRIG 87.0 01/19/2013 1014   HDL 33.60 (L) 01/19/2013 1014   CHOLHDL 4 01/19/2013 1014   VLDL 17.4 01/19/2013 1014   LDLCALC 79 01/19/2013 1014    BP Readings from Last 3 Encounters:  07/31/18 138/80  07/20/18 (!) 160/93  06/30/18 134/80    No Known Allergies  Medications Reviewed Today    Reviewed by Lavera Guise, [facility] (Pharmacist) on 12/03/18 at [place] List Status: <None>  Medication Order Taking? Sig Documenting Provider Last Dose Status Informant  albuterol (PROVENTIL HFA;VENTOLIN HFA) 108 (90 Base) MCG/ACT inhaler [id number] Yes Inhale 2 puffs into the lungs every 6 (six) hours as needed for wheezing or shortness of breath. [name], MD Taking Active    apixaban (ELIQUIS) 5 MG TABS tablet [id number] Yes Take 1 tablet (5 mg total) by mouth 2 (two) times daily. Marletta Lor, MD Taking Active   Ascorbic Acid (VITAMIN C) 1000 MG tablet [id number]  Take 1,000 mg by mouth daily. [provider]  Active Self  benazepril (LOTENSIN) 40 MG tablet [id number]  Take 1 tablet (40 mg total) by mouth daily. Marletta Lor, MD  Active   colchicine 0.6 MG tablet [id number]  Take 1 tablet (0.6 mg total) by mouth daily. Nafziger, [name], NP  Active   diltiazem (CARDIZEM CD) 120 MG 24 hr capsule [id number]  Take 1 capsule by mouth as needed when Heart Rate is above 024 and Systolic Blood Pressure is above 100 mm/hg Marletta Lor, MD  Active   esomeprazole (NEXIUM) 40 MG capsule [id number]  Take 1 capsule (40 mg total) by mouth daily. Marletta Lor, MD  Active   ferrous sulfate 325 (65 FE) MG EC tablet [id number]  Take 325 mg by mouth daily with breakfast. [provider]  Active   fluticasone (FLOVENT HFA) 44 MCG/ACT inhaler [id number]  Inhale 2 puffs into the lungs 2 (two) times daily.  Patient not taking:  Reported on 09/04/2018   Lucretia Kern, DO  Active   furosemide (LASIX) 20 MG tablet [id number] Yes Take 2 tablets (40 mg total) by mouth daily. [name], Wonda Cheng, MD Taking Active   gabapentin (NEURONTIN) 600 MG tablet [id number] Yes Take 1 tablet (600  mg total) by mouth 2 (two) times daily. Marletta Lor, MD Taking Active   HYDROcodone-acetaminophen (NORCO/VICODIN) 5-325 MG tablet [id number] No Take 1-2 tablets by mouth every 6 (six) hours as needed for moderate pain.  Patient not taking:  Reported on 12/03/2018   [name], NP Not Taking Active   LORazepam (ATIVAN) 0.5 MG tablet [id number] Yes 1 tablet twice daily as needed for anxiety or sleep Nafziger, [name], NP Taking Active   metoprolol tartrate (LOPRESSOR) 50 MG tablet [id number] Yes Take 1.5 tablets (75 mg total) by mouth 2 (two) times daily. Marletta Lor, MD  Taking Active   nitroGLYCERIN (NITROSTAT) 0.4 MG SL tablet [id number] Yes Place 1 tablet (0.4 mg total) under the tongue every 5 (five) minutes as needed for chest pain (x 3 pills).  Marletta Lor, MD Taking Active Self  sertraline (ZOLOFT) 50 MG tablet [id number] Yes Take 1 tablet (50 mg total) by mouth daily. Marletta Lor, MD Taking Active   Tiotropium  [place] RESPIMAT) 2.5 MCG/ACT AERS [id number] Yes Inhale 2 puffs into the lungs daily. [name], MD Taking Active   traZODone (DESYREL) 100 MG tablet [id number] Yes TAKE 1/2 (ONE-HALF) TABLET BY MOUTH AT BEDTIME Marletta Lor, MD Taking Active   triamcinolone cream (KENALOG) 0.1 % [id number] Yes Apply 1 application topically 2x daily as needed (Eczema on legs). Marletta Lor, MD Taking Active Self        Assessment:  Drugs sorted by system:  Neurologic/Psychologic: sertraline, gabapentin, trazodone, lorazepam  Cardiovascular: furosemide, diltiazem, metop, apixaban, benazepril, nitrates PRN  Pulmonary/Allergy: Flovent, Spiriva Respimat, Albuterol PRN  Gastrointestinal: esomeprazole  Topical: triamcinolone  Pain: hydrocodone PRN  Vitamins/Minerals/Supplements: vit C, iron  Misc: colchicine  Medication Review Findings:  . Eliquis filled 10/27/18 for 30 days supply-->should be out unless samples? Will clarify . Of note--> Furosemide 45m 90 day filled 09/22/18; metoprolol, sertraline, gabapentin--> 90 day filled 09/27/18 . Is patient still taking the following RXs: diltiazem? benazepril? colchicine? Nexium? Flovent?  There is no recent fill history for these medications ( no 2020 data) . Is patient still taking the following OTC medications: Vit C, Iron? . SCr at b/l 1.2-1.33, K+ steady at 4.6   PLAN: -Will continue quarterly outreach for HTA C-SNP patient -Will route [facility] AEtter Sjogrenfor update as she is assisting with patient assistance programs ([employer], [employer] -Patient faded as call  progressed-->will follow up next regarding med review issues as above   JRegina Eck PharmD, [phone]

## 2018-12-03 NOTE — Patient Outreach (Signed)
[facility]) Care Management  12/03/2018  Zenobia Kuennen Spear 1929/08/23 [id number]   Outreached contact person at [facility] to request printout for Ms. Cataldo Spiriva Respimat to verify co-pay amount that is required to extend her approval thru B-I. Contact stated that it appears that for this particular medication her co-pay would be $5.11 with her LIS Lvl 4.  Sent in basket message to Dr. Cordelia Pen RN requesting that script for Spiriva Respimat be sent into her pharmacy [facility]) so that I can follow up with them (Pharmacy) to confirm co-pay.  [name] [name] [place] Certified Pharmacy Technician [place] Management Direct [phone]

## 2018-12-04 ENCOUNTER — Encounter: Payer: Self-pay | Admitting: Pulmonary Disease

## 2018-12-04 ENCOUNTER — Other Ambulatory Visit: Payer: Self-pay

## 2018-12-04 MED ORDER — TIOTROPIUM BROMIDE MONOHYDRATE 2.5 MCG/ACT IN AERS: 2.0000 | INHALATION_SPRAY | Freq: Every day | RESPIRATORY_TRACT | 0 refills | Status: DC

## 2018-12-08 ENCOUNTER — Other Ambulatory Visit: Payer: Self-pay | Admitting: Pharmacy Technician

## 2018-12-08 NOTE — Patient Outreach (Addendum)
[place] [facility]) Care Management  12/08/2018  [name] [birth year] [id number]                          Medication Assistance Referral  Referral From: [place]  Medication/Company: Eliquis / [place] Patient application portion:  Mailed Provider application portion: Faxed  to C. Nafziger (Dr. Burnice Logan)   Follow up:  Will follow up with patient in 7-10 business days to confirm application(s) have been received.  [name] [name] [place] Certified Pharmacy Technician [place] Management Direct [phone]

## 2018-12-10 ENCOUNTER — Other Ambulatory Visit: Payer: Self-pay | Admitting: Pharmacist

## 2018-12-10 ENCOUNTER — Other Ambulatory Visit (INDEPENDENT_AMBULATORY_CARE_PROVIDER_SITE_OTHER): Payer: HMO

## 2018-12-10 ENCOUNTER — Ambulatory Visit: Payer: Self-pay | Admitting: Pharmacist

## 2018-12-10 DIAGNOSIS — J449 Chronic obstructive pulmonary disease, unspecified: Secondary | ICD-10-CM

## 2018-12-10 LAB — BASIC METABOLIC PANEL
BUN: 39 mg/dL — ABNORMAL HIGH (ref 6–23)
CO2: 30 mEq/L (ref 19–32)
Calcium: 9.8 mg/dL (ref 8.4–10.5)
Chloride: 103 mEq/L (ref 96–112)
Creatinine, Ser: 1.51 mg/dL — ABNORMAL HIGH (ref 0.40–1.20)
GFR: 32.62 mL/min — ABNORMAL LOW (ref >60.00)
Glucose, Bld: 100 mg/dL — ABNORMAL HIGH (ref 70–99)
Potassium: 4.9 mEq/L (ref 3.5–5.1)
Sodium: 141 mEq/L (ref 135–145)

## 2018-12-10 NOTE — Patient Outreach (Addendum)
[place] [facility]) Care Management  [place]  12/10/2018  [name] 1930-07-05 [id number]   Reason for referral: Medication Assistance  Referral source: [employer] [place] with [facility] Current insurance: [employer] C-SNP  Reason for call: medication updates  Outreach:  Successful outreach to patient with HIPAA identifiers verified.  Patient states she is doing okay today.  Updated patient on patient assistance programs: -Spiriva will be obtained at [place] in [place] for around $11 on patient's insurance (Level 4 LIS).  Patient in agreement.  -[facility] CPhT, Etter Sjogren to determine next steps with Proventil RX.  Patient [place] be able to get albuterol inhaler cheaper on insurance.  We attempted to apply for PAP for Proventil, however patient has lost attestation form that needs to mailed back with application.   -Eliquis application has been mailed to patient this week  Plan:  -Information discussed with [facility] CPhT, Etter Sjogren who will reach out to insurance regarding albuterol -I will follow up as needed  Regina Eck, PharmD, [place] Pharmacist, [facility] [place]: [phone]

## 2018-12-15 ENCOUNTER — Other Ambulatory Visit: Payer: Self-pay | Admitting: Pharmacy Technician

## 2018-12-15 DIAGNOSIS — L859 Epidermal thickening, unspecified: Secondary | ICD-10-CM | POA: Diagnosis not present

## 2018-12-15 DIAGNOSIS — B078 Other viral warts: Secondary | ICD-10-CM | POA: Diagnosis not present

## 2018-12-15 DIAGNOSIS — L570 Actinic keratosis: Secondary | ICD-10-CM | POA: Diagnosis not present

## 2018-12-15 DIAGNOSIS — X32XXXD Exposure to sunlight, subsequent encounter: Secondary | ICD-10-CM | POA: Diagnosis not present

## 2018-12-15 NOTE — Patient Outreach (Signed)
[place] [facility]) Care Management  12/15/2018  [name] 1929/08/24 [id number]   [place] [employer] contact and was informed that with patients level of LIS it is possible for her to receive her albuterol HFA inhaler at a reduced co-pay. Contact stated that a certain NDC for medication is required to be used for HTA to pay.   [place] and spoke to [place] who stated that new script for Proair (ok to substitute) would need to sent in, in order for them to bill the insurance correctly.  Sent Dr. Althia Forts nurse an inbasket message requesting new script to be sent into pharmacy.  Will follow up with pharmacy after script has been sent in to confirm co-pay amount.  [name] [name] [place] Certified Pharmacy Technician [place] Management Direct Dial:[phone]

## 2018-12-16 ENCOUNTER — Telehealth: Payer: Self-pay | Admitting: Pulmonary Disease

## 2018-12-16 ENCOUNTER — Ambulatory Visit: Payer: Self-pay | Admitting: Pharmacist

## 2018-12-16 NOTE — Telephone Encounter (Signed)
I have sent a message to Erline Levine letting her know that the CT needs to be changed and she acknowledged this. Erline Levine will change this on the previous order that was placed. Nothing further needed.

## 2018-12-16 NOTE — Telephone Encounter (Signed)
Pt had BMET performed 12/10/2018: -Sodium 141 -Potassium 4.9 -Chloride 103 -CO2 30 -Glucose, bld 100 -BUN 39 -Creatinine, Ser 1.51 -GFR 32.62  Called and spoke with Erline Levine from [facility] in regards to pt's labwork and she wants to know if we still want pt to have CT with contrast which is currently scheduled for 6/5 at 1:30 or if we want to change it to CT without contrast. Erline Levine stated all we needed to do was let her know if we wanted to change it and she could just change it from the order that we had originally placed.  Beth, please advise if you want to keep pt's CT as is with contrast or change to CT without contrast based on the BMET results?

## 2018-12-16 NOTE — Telephone Encounter (Signed)
Please change CT to non-contrast (under Dr. Loanne Drilling so she gets results). Thank you

## 2018-12-17 ENCOUNTER — Telehealth: Payer: Self-pay | Admitting: Nurse Practitioner

## 2018-12-17 MED ORDER — ALBUTEROL SULFATE HFA 108 (90 BASE) MCG/ACT IN AERS: 2.0000 | INHALATION_SPRAY | Freq: Four times a day (QID) | RESPIRATORY_TRACT | 5 refills | Status: DC | PRN

## 2018-12-17 NOTE — Telephone Encounter (Signed)
[name] with pharmacy at [facility] requested Proair order to be sent in as 'preferred' med for patient's insurance for albuterol inhaler.  [facility] is working with patient on cost saving measures for medications. Patient is a currently on albuterol inhaler 2 puff, q6 hours PRN per Dr. Loanne Drilling.  LOV 07/31/18 COPD with Dr. Loanne Drilling.   Order placed as refill under T. Nils Pyle, NP in Dr. Cordelia Pen absence (covid schedule) to [facility].   Nothing further needed.

## 2018-12-18 ENCOUNTER — Ambulatory Visit (INDEPENDENT_AMBULATORY_CARE_PROVIDER_SITE_OTHER)
Admission: RE | Admit: 2018-12-18 | Discharge: 2018-12-18 | Disposition: A | Discharge status: Home / Self Care | Payer: HMO | Source: Ambulatory Visit | Attending: Pulmonary Disease | Admitting: Pulmonary Disease

## 2018-12-18 ENCOUNTER — Other Ambulatory Visit: Payer: Self-pay

## 2018-12-18 DIAGNOSIS — J9811 Atelectasis: Secondary | ICD-10-CM | POA: Diagnosis not present

## 2018-12-18 DIAGNOSIS — R0602 Shortness of breath: Secondary | ICD-10-CM | POA: Diagnosis not present

## 2018-12-21 ENCOUNTER — Other Ambulatory Visit: Payer: Self-pay | Admitting: Pharmacy Technician

## 2018-12-21 NOTE — Patient Outreach (Signed)
[facility]) Care Management  12/21/2018  Sianni Cloninger Natt [birth year] [id number]   Incoming message from HTA [employer]) contact stating that patients co-pay for generic Proair through [place] processed at <$6.00.  Informed [facility] RPh Lottie Dawson of update.  [name] [name] [place] Certified Pharmacy Technician [place] Management Direct Dial:[phone]

## 2018-12-23 ENCOUNTER — Other Ambulatory Visit: Payer: Self-pay | Admitting: Pharmacy Technician

## 2018-12-23 NOTE — Patient Outreach (Signed)
[place] [facility]) Care Management  12/23/2018  [name] 1930-01-08 [id number]   Received patient portion(s) of patient assistance application for Eliquis. Re-faxed provider portion of application to C. Nafziger.  Will Submit to BMS once document has been received.   [name] [name] Certified Pharmacy Technician [place] Management Direct Dial:[phone]

## 2018-12-30 ENCOUNTER — Other Ambulatory Visit: Payer: Self-pay | Admitting: Pharmacy Technician

## 2018-12-30 NOTE — Patient Outreach (Signed)
[place] [facility]) Care Management  12/30/2018  [name] [place] [birth year] [id number]   In basket message sent to C. Nafziger in regards to provider portion of Eliquis patient assistance application. [name] states that he filled it out and faxed it into BMS.  Faxed patient portion and required documents into BMS.  Will follow up with company in 7-10 business days to check status of application.  [name] [name] [name] Certified Pharmacy Technician [place] Management Direct Dial:[phone]

## 2019-01-06 ENCOUNTER — Other Ambulatory Visit: Payer: Self-pay | Admitting: Pharmacy Technician

## 2019-01-06 NOTE — Patient Outreach (Signed)
[place] [facility]) Care Management  01/06/2019  Kele Withem Dada 30-Dec-1929 [id number]    Follow up call placed to BMS regarding patient assistance application(s) for Eliquis , Pennelope Bracken confirms paitent has been approved as of 6-19 until 07-15-19.   Call placed to [facility] pharmacy to check shipment status. Caryl Pina confirms that 3 month supply of Eliquis was delivered to patients home on 6/23 @ 1:38pm.  Follow up:  Will follow up with patient in 2-3 business days to confirm medication has been received.  [name] [name] [place] Certified Pharmacy Technician [place] Management Direct [phone]

## 2019-01-26 ENCOUNTER — Other Ambulatory Visit: Payer: Self-pay

## 2019-01-26 ENCOUNTER — Encounter: Payer: Self-pay | Admitting: Adult Health

## 2019-01-26 ENCOUNTER — Ambulatory Visit (INDEPENDENT_AMBULATORY_CARE_PROVIDER_SITE_OTHER): Payer: HMO | Admitting: Adult Health

## 2019-01-26 DIAGNOSIS — T148XXA Other injury of unspecified body region, initial encounter: Secondary | ICD-10-CM | POA: Diagnosis not present

## 2019-01-26 NOTE — Progress Notes (Signed)
Virtual Visit via Video Note  I connected with [name] on 01/26/19 at  4:30 PM EDT by a video enabled telemedicine application and verified that I am speaking with the correct person using two identifiers.  Location patient: home Location provider:work or home office Persons participating in the virtual visit: patient, provider, daughter  I discussed the limitations of evaluation and management by telemedicine and the availability of in person appointments. The patient expressed understanding and agreed to proceed.   HPI: 85 year old female who is being evaluated today for an acute issue.  Her symptoms have been present for approximately 1 week but have slowly been improving.  She reports bruising to the top and bottom of the right and wrists well as a "knot" on the top of her right hand.   She believes that the knot has been slowly decreasing in size over the last week.  She denies falls or aggravating injury.  She is on Eliquis  NO CP/SOB   ROS: See pertinent positives and negatives per HPI.  Past Medical History:  Diagnosis Date  . Adenocarcinoma, colon [facility]) Oncologist-- Dr Truitt Merle   Multifocal (2) Colon cancer @ ileocecal valve and ascending ( [id number]), Stage IIIB, grade I, MMR normal , 2 of 16 +lymph nodes, negative surgical margins---  06-02-2014  Right hemicolectomy w/ colostomy  . Allergy   . Anxiety   . Asthma    inhaler "sometimes"  . CAD (coronary artery disease)    a. LHC 4/09: pLAD 40, mDx 70-75, pLCx 30, mLCx 50, mRCA 90, EF 60% >> PCI: BMS x2 to RCA;  b. Myoview 8/12: normal  . Cataract    bil cateracts removed  . Chronic anemia   . Chronic diastolic CHF (congestive heart failure) (HCC)    a. Echo 912 - Mild LVH, EF 55-60%, no RWMA, Gr 1 DD, mild MR, mild LAE, mild RAE, PASP 59 mmHg (mod to severe pulmo HTN)  . Clotting disorder (HCC)    hx of dvt, tia on eliquis  . Colostomy in place [facility])    s/p colostomy takedown 5/16  . Colovaginal fistula    s/p  colostomy >> colostomy takedown 5/16  . COPD with chronic bronchitis ([place])   . Depression   . Dysrhythmia    A fib  . Essential hypertension   . GERD (gastroesophageal reflux disease)   . History of adenomatous polyp of colon   . History of blood transfusion   . History of DVT of lower extremity   . History of hiatal hernia   . History of TIA (transient ischemic attack)    a. Head CT 6/15: small chronic lacunar infarct in thalamus  . Hyperlipidemia   . OA (osteoarthritis)   . Osteoporosis   . Peripheral neuropathy   . Pneumonia   . Pulmonary HTN ([place])    a. PASP on Echo in 9/12:  59 mmHg  . Renal insufficiency   . Sigmoid diverticulosis    s/p sigmoid colectomy  . Stroke ([place])    TIAs  . Wears dentures   . Wears glasses     Past Surgical History:  Procedure Laterality Date  . ABDOMINAL HYSTERECTOMY    . ANTERIOR CERVICAL DECOMP/DISCECTOMY FUSION  01-31-2009   C5 -- 7  . APPENDECTOMY    . Bone spurs Bilateral    Feet  . CARDIOVASCULAR STRESS TEST  02-26-2011   Normal lexiscan no exercise study/  no ischemia/  normal LV function and wall motion, ef 67%  .  CARPAL TUNNEL RELEASE Right   . CATARACT EXTRACTION W/ INTRAOCULAR LENS  IMPLANT, BILATERAL Bilateral   . CHOLECYSTECTOMY    . COLOSTOMY N/A 06/02/2014   Procedure: DIVERTING DESCENDING END COLOSTOMY;  Surgeon: Donnie Mesa, MD;  Location: [place];  Service: General;  Laterality: N/A;  . CORONARY ANGIOPLASTY WITH STENT PLACEMENT  11-04-2007  dr bensimhon   BMS x2 to RCA/  mild Non-obstructive disease LAD/  normal LVF  . DILATION AND CURETTAGE OF UTERUS    . ESOPHAGOGASTRODUODENOSCOPY N/A 10/28/2017   Procedure: ESOPHAGOGASTRODUODENOSCOPY (EGD);  Surgeon: Yetta Flock, MD;  Location: [facility] ENDOSCOPY;  Service: Gastroenterology;  Laterality: N/A;  . EVALUATION UNDER ANESTHESIA WITH ANAL FISTULECTOMY N/A 10/13/2014   Procedure: ANAL EXAM UNDER ANESTHESIA ;  Surgeon: [name], MD;  Location: [facility];  Service:  General;  Laterality: N/A;  . HAND SURGERY     Tendon repair  . LAPAROSCOPIC SIGMOID COLECTOMY N/A 11/24/2014   Procedure: SIGMOID COLECTOMY AND COLSOTOMY CLOSURE;  Surgeon: Donnie Mesa, MD;  Location: [place];  Service: General;  Laterality: N/A;  . LUMBAR LAMINECTOMY  10-29-2002,  1969   Left  L3 -- 4  decompression  . PARTIAL COLECTOMY N/A 06/02/2014   Procedure: RIGHT PARTIAL COLECTOMY ;  Surgeon: Donnie Mesa, MD;  Location: [place];  Service: General;  Laterality: N/A;  . PATELLECTOMY Right 09/14/2013   Procedure: PATELLECTOMY;  Surgeon: Meredith Pel, MD;  Location: [place];  Service: Orthopedics;  Laterality: Right;  . PROCTOSCOPY N/A 10/13/2014   Procedure: RIDGE PROCTOSCOPY;  Surgeon: [name], MD;  Location: [facility];  Service: General;  Laterality: N/A;  . REVISION TOTAL KNEE ARTHROPLASTY Bilateral right  04-02-2011/  left 1996 & 10-05-1999  . ROTATOR CUFF REPAIR Bilateral   . TONSILLECTOMY    . TOTAL KNEE ARTHROPLASTY Bilateral left 1994/  right 2000  . TOTAL KNEE REVISION Left 08/02/2015   Procedure: LEFT FEMORAL REVISION;  Surgeon: Gaynelle Arabian, MD;  Location: [facility];  Service: Orthopedics;  Laterality: Left;  . TRANSTHORACIC ECHOCARDIOGRAM  12-01-2009   Grade I diastolic dysfunction/  ef 60%/  moderate MR/  mild TR    Family History  Problem Relation Age of Onset  . Osteoarthritis Mother   . Heart failure Mother   . Colon cancer Maternal Uncle 32  . Heart Problems Father   . Cancer Sister 58       ? colon cancer   . Kidney cancer Brother 34  . Esophageal cancer Neg Hx   . Rectal cancer Neg Hx   . Stomach cancer Neg Hx   . Pancreatic cancer Neg Hx       Current Outpatient Medications:  .  acetaminophen (TYLENOL) 325 MG tablet, Take 650 mg by mouth every 6 (six) hours as needed for moderate pain. , Disp: , Rfl:  .  albuterol (PROAIR HFA) 108 (90 Base) MCG/ACT inhaler, Inhale 2 puffs into the lungs every 6 (six) hours as needed for wheezing or shortness of  breath., Disp: 1 Inhaler, Rfl: 5 .  albuterol (PROVENTIL HFA;VENTOLIN HFA) 108 (90 Base) MCG/ACT inhaler, Inhale 2 puffs into the lungs every 6 (six) hours as needed for wheezing or shortness of breath., Disp: 1 Inhaler, Rfl: 3 .  apixaban (ELIQUIS) 5 MG TABS tablet, Take 1 tablet (5 mg total) by mouth 2 (two) times daily., Disp: 180 tablet, Rfl: 3 .  Ascorbic Acid (VITAMIN C) 1000 MG tablet, Take 1,000 mg by mouth daily., Disp: , Rfl:  .  benazepril (  LOTENSIN) 40 MG tablet, Take 1 tablet (40 mg total) by mouth daily., Disp: 90 tablet, Rfl: 4 .  colchicine 0.6 MG tablet, Take 1 tablet (0.6 mg total) by mouth daily., Disp: 20 tablet, Rfl: 2 .  diltiazem (CARDIZEM CD) 120 MG 24 hr capsule, Take 1 capsule by mouth as needed when Heart Rate is above 580 and Systolic Blood Pressure is above 100 mm/hg, Disp: 90 capsule, Rfl: 3 .  esomeprazole (NEXIUM) 40 MG capsule, Take 1 capsule (40 mg total) by mouth daily., Disp: 90 capsule, Rfl: 4 .  ferrous sulfate 325 (65 FE) MG EC tablet, Take 325 mg by mouth daily with breakfast., Disp: , Rfl:  .  fluticasone (FLOVENT HFA) 44 MCG/ACT inhaler, Inhale 2 puffs into the lungs 2 (two) times daily. (Patient not taking: Reported on 09/04/2018), Disp: 1 Inhaler, Rfl: 0 .  furosemide (LASIX) 20 MG tablet, Take 2 tablets (40 mg total) by mouth daily., Disp: 180 tablet, Rfl: 3 .  gabapentin (NEURONTIN) 600 MG tablet, Take 1 tablet (600 mg total) by mouth 2 (two) times daily., Disp: 180 tablet, Rfl: 3 .  HYDROcodone-acetaminophen (NORCO/VICODIN) 5-325 MG tablet, Take 1-2 tablets by mouth every 6 (six) hours as needed for moderate pain. (Patient not taking: Reported on 12/03/2018), Disp: 10 tablet, Rfl: 0 .  LORazepam (ATIVAN) 0.5 MG tablet, 1 tablet twice daily as needed for anxiety or sleep, Disp: 60 tablet, Rfl: 2 .  metoprolol tartrate (LOPRESSOR) 50 MG tablet, Take 1.5 tablets (75 mg total) by mouth 2 (two) times daily., Disp: 270 tablet, Rfl: 3 .  nitroGLYCERIN (NITROSTAT)  0.4 MG SL tablet, Place 1 tablet (0.4 mg total) under the tongue every 5 (five) minutes as needed for chest pain (x 3 pills). Reported on 09/01/2015, Disp: 25 tablet, Rfl: 3 .  sertraline (ZOLOFT) 50 MG tablet, Take 1 tablet (50 mg total) by mouth daily., Disp: 90 tablet, Rfl: 3 .  Spacer/Aero-Holding Chambers (E-Z SPACER) inhaler, Use as instructed, Disp: 1 each, Rfl: 2 .  Tiotropium Bromide Monohydrate (SPIRIVA RESPIMAT) 2.5 MCG/ACT AERS, Inhale 2 puffs into the lungs daily., Disp: 1 Inhaler, Rfl: 0 .  traZODone (DESYREL) 100 MG tablet, TAKE 1/2 (ONE-HALF) TABLET BY MOUTH AT BEDTIME, Disp: 60 tablet, Rfl: 5 .  triamcinolone cream (KENALOG) 0.1 %, Apply 1 application topically 2 (two) times daily as needed (Eczema on legs)., Disp: 30 g, Rfl: 2  EXAM:  VITALS per patient if applicable:  GENERAL: alert, oriented, appears well and in no acute distress  HEENT: atraumatic, conjunttiva clear, no obvious abnormalities on inspection of external nose and ears  NECK: normal movements of the head and neck  LUNGS: on inspection no signs of respiratory distress, breathing rate appears normal, no obvious gross SOB, gasping or wheezing  CV: no obvious cyanosis  MS: moves all visible extremities without noticeable abnormality  PSYCH/NEURO: pleasant and cooperative, no obvious depression or anxiety, speech and thought processing grossly intact  SKIN: Noted hematoma on dorsal aspect of right hand.  She does have bruising to the palmar aspect extending down to mid wrist.  Has some bruising over the tops of her fingers on the right hand.  There is no loss of range of motion of the hand, wrist, or fingers.  ASSESSMENT AND PLAN:  Discussed the following assessment and plan:  1. Hematoma -From Eliquis.  Reassurance given.  Advised using a heating pad multiple times a day.  Follow-up if symptoms do not improve in the next week  I discussed the assessment and treatment plan with the patient. The  patient was provided an opportunity to ask questions and all were answered. The patient agreed with the plan and demonstrated an understanding of the instructions.   The patient was advised to call back or seek an in-person evaluation if the symptoms worsen or if the condition fails to improve as anticipated.   [name], NP

## 2019-02-02 ENCOUNTER — Other Ambulatory Visit: Payer: Self-pay | Admitting: Pharmacist

## 2019-02-02 NOTE — Patient Outreach (Addendum)
[facility]) Care Management  [place]  02/02/2019  [name] [birth year] [id number]   Reason for referral: Medication Assistance  Referral source: [facility] RN Current insurance: [employer]  Reason for call: ensure patient received Eliquis via patient assistance ([employer]).  Of note, patient has been DX with hematoma, but continues on Eliquis per office visit on 01/27/19.  PCP continues to follow.  Outreach:  Unsuccessful telephone call attempt to patient.   HIPAA compliant voicemail left requesting a return call  Plan:  -I will make another outreach attempt to patient within 3-4 business days.    Regina Eck, PharmD, [place]  [phone]

## 2019-02-05 ENCOUNTER — Other Ambulatory Visit: Payer: Self-pay | Admitting: Pharmacist

## 2019-02-05 ENCOUNTER — Ambulatory Visit: Payer: Self-pay | Admitting: Pharmacist

## 2019-02-05 NOTE — Patient Outreach (Addendum)
[facility]) [place]  02/05/2019  [name] 11-Aug-1929 [id number]  Reason for referral: medication assistance/ HTA CSNP (CHF)  Reason for call: ensure patient received Eliquis via patient assistance [employer] squib[employer]).  Of note, patient has been DX with hematoma, but continues on Eliquis per office visit on 01/27/19.  PCP continues to follow.  Voicemail left encouraging call back.   PLAN: - I will follow up with patient quarterly or sooner if call returned   Regina Eck, PharmD, [place]: [phone]

## 2019-03-11 ENCOUNTER — Other Ambulatory Visit: Payer: Self-pay | Admitting: Adult Health

## 2019-03-11 MED ORDER — TRAZODONE HCL 100 MG PO TABS: ORAL_TABLET | ORAL | 1 refills | Status: DC

## 2019-03-11 NOTE — Telephone Encounter (Signed)
Requested medication (s) are due for refill today: yes  Requested medication (s) are on the active medication list: yes  Last refill:  02/18/2018  Future visit scheduled: no  Notes to clinic:  Ordering provider is different from pcp   Requested Prescriptions  Pending Prescriptions Disp Refills   traZODone (DESYREL) 100 MG tablet 60 tablet 5    Sig: TAKE 1/2 (ONE-HALF) TABLET BY MOUTH AT BEDTIME     Psychiatry: Antidepressants - Serotonin Modulator Passed - 03/11/2019  9:34 AM      Passed - Valid encounter within last 6 months    Recent Outpatient Visits          1 month ago Hematoma   [facility] [place], NP   4 months ago Cellulitis of finger of left hand   [facility]rapist, [facility] at [facility], [place], NP   5 months ago Cellulitis of finger of left hand   [facility], [place], NP   8 months ago Cellulitis of right lower extremity   Therap[facility], [facility] at [facility], [place], NP   8 months ago Cellulitis of right lower extremity   Therapist, [facility] at [facility], [place], NP      Future Appointments            In 3 weeks [name], MD [facility]           Passed - Completed PHQ-2 or PHQ-9 in the last 360 days.

## 2019-03-11 NOTE — Telephone Encounter (Signed)
Copied from [place] [phone]. Topic: Quick Communication - Rx Refill/Question >> Mar 11, 2019  9:30 AM Leward Quan A wrote: Medication: traZODone (DESYREL) 100 MG tablet  Per patient she is all out and need refill today   Has the patient contacted their pharmacy? Yes.   (Agent: If no, request that the patient contact the pharmacy for the refill.) (Agent: If yes, when and what did the pharmacy advise?)  Preferred Pharmacy (with phone number or street name): [place] ([place]),  - [address] [phone] (Phone) [phone] (Fax)    Agent: Please be advised that RX refills Driver take up to 3 business days. We ask that you follow-up with your pharmacy.

## 2019-03-30 ENCOUNTER — Other Ambulatory Visit: Payer: Self-pay

## 2019-03-30 NOTE — Patient Outreach (Signed)
  [facility]) Care Management Chronic Special Needs Program  03/30/2019  Name: [name] DOB: [birth year]  MRN: [id number]  Ms. [name] is enrolled in a chronic special needs plan for Heart Failure. Reviewed and updated care plan.  Subjective: Client states she has been doing good.  States she weights about every other day but her weights has been stable.  States her COPD is unchanged.  States her SOB is about the same.  Denies any chest pains.  Denies any recent falls.  States she tries to stay active by working in her flowers and taking care of her dog.    Goals Addressed            This Visit's Progress   . Client understands the importance of follow-up with providers by attending scheduled visits   On track   . Client will not report change from baseline and no repeated symptoms of stroke with in the next 6 months (continue 03/30/19)   On track   . COMPLETED: Client will report abillity to obtain Medications within 3 months       Approved for pharmacy assistance    . Client will report no fall or injuries in the next 6 months.(continue 03/30/19)   On track   . Client will report no worsening of symptoms of Atrial Fibrillation within the next 6 months(continue 03/30/19)   On track   . Client will report no worsening of symptoms related to heart disease within the next 6 months(continue 03/30/19)   On track   . Client will verbalize knowledge of chronic lung disease as evidenced by no ED visits or Inpatient stays related to chronic lung disease    On track   . Client will verbalize knowledge of self management of Hypertension as evidences by BP reading of 140/90 or less; or as defined by provider   On track   . Decrease inpatient Heart Failure admissions/ readmissions with in the year   On track   . Decrease the use of hospital emergency department related to heart failure within the next year   On track   . Maintain timely refills of Heart Failure medication as  prescribed within the year    On track   . Obtain annual  Lipid Profile, LDL-C   On track   . Visit Primary Care Provider or Cardiologist at least 2 times per year   On track    Client is receiving pharmacy assistance with Eliquis Reinforced to try to weight daily and to write down weights Reviewed s/s of HF to call MD  Encouraged to continue to  follow a low sodium diet  Plan:  Send successful outreach letter with a copy of their individualized care plan and Send individual care plan to provider  Chronic care management coordinator will outreach in:  4-6 Months    Peter Garter RN, [facility], [place] Management Coordinator [place] Management [phone]

## 2019-04-05 ENCOUNTER — Other Ambulatory Visit: Payer: Self-pay

## 2019-04-05 ENCOUNTER — Encounter: Payer: Self-pay | Admitting: Pulmonary Disease

## 2019-04-05 ENCOUNTER — Ambulatory Visit: Payer: HMO | Admitting: Pulmonary Disease

## 2019-04-05 VITALS — BP 124/78 | HR 83 | Temp 97.0°F | Ht 63.5 in | Wt 187.0 lb

## 2019-04-05 DIAGNOSIS — R918 Other nonspecific abnormal finding of lung field: Secondary | ICD-10-CM | POA: Diagnosis not present

## 2019-04-05 DIAGNOSIS — J432 Centrilobular emphysema: Secondary | ICD-10-CM | POA: Diagnosis not present

## 2019-04-05 DIAGNOSIS — Z23 Encounter for immunization: Secondary | ICD-10-CM | POA: Diagnosis not present

## 2019-04-05 DIAGNOSIS — R0789 Other chest pain: Secondary | ICD-10-CM

## 2019-04-05 MED ORDER — SPIRIVA RESPIMAT 2.5 MCG/ACT IN AERS: 2.0000 | INHALATION_SPRAY | Freq: Every day | RESPIRATORY_TRACT | 0 refills | Status: DC

## 2019-04-05 MED ORDER — SPIRIVA RESPIMAT 2.5 MCG/ACT IN AERS: 2.0000 | INHALATION_SPRAY | Freq: Every day | RESPIRATORY_TRACT | 3 refills | Status: DC

## 2019-04-05 MED ORDER — ALBUTEROL SULFATE HFA 108 (90 BASE) MCG/ACT IN AERS: 2.0000 | INHALATION_SPRAY | Freq: Four times a day (QID) | RESPIRATORY_TRACT | 6 refills | Status: DC | PRN

## 2019-04-05 NOTE — Progress Notes (Signed)
Synopsis: Referred in 04/2018 for lung nodule  Subjective:   PATIENT ID: [name] GENDER: female DOB: 01-28-1930, MRN: [id number]   HPI  Chief Complaint  Patient presents with  . Follow-up    copd   Ms. [name] is a 85 year old female former smoker, atrial fibrillation, hx colon cancer s/p resection, CAD s/p stent and chronic diastolic heart failure who presents for follow-up.  Our last in-person visit was on 07/31/18 prior to the pandemic. Since then she has self-discontinued Spiriva. She continues to have severe dyspnea on exertion including walking short distances including from the lobby to our clinic room. When she walks to get her mail, she will always take a rest part way on her walker. Her shortness of breath is associated with dry cough and wheezing in the late evening and in the morning. She will take her albuterol 2-3 times a day with improvement.   Social history. Former smoker. 7.5 pack-years. Quit in 1969  I have personally reviewed patient's past medical/family/social history/allergies/current medications.  Past Medical History:  Diagnosis Date  . Adenocarcinoma, colon [facility]) Oncologist-- Dr Truitt Merle   Multifocal (2) Colon cancer @ ileocecal valve and ascending ( mT4N1Mx), Stage IIIB, grade I, MMR normal , 2 of 16 +lymph nodes, negative surgical margins---  06-02-2014  Right hemicolectomy w/ colostomy  . Allergy   . Anxiety   . Asthma    inhaler "sometimes"  . CAD (coronary artery disease)    a. LHC 4/09: pLAD 40, mDx 70-75, pLCx 30, mLCx 50, mRCA 90, EF 60% >> PCI: BMS x2 to RCA;  b. Myoview 8/12: normal  . Cataract    bil cateracts removed  . Chronic anemia   . Chronic diastolic CHF (congestive heart failure) (HCC)    a. Echo 912 - Mild LVH, EF 55-60%, no RWMA, Gr 1 DD, mild MR, mild LAE, mild RAE, PASP 59 mmHg (mod to severe pulmo HTN)  . Clotting disorder (HCC)    hx of dvt, tia on eliquis  . Colostomy in place [facility])    s/p colostomy takedown 5/16   . Colovaginal fistula    s/p colostomy >> colostomy takedown 5/16  . COPD with chronic bronchitis ([place])   . Depression   . Dysrhythmia    A fib  . Essential hypertension   . GERD (gastroesophageal reflux disease)   . History of adenomatous polyp of colon   . History of blood transfusion   . History of DVT of lower extremity   . History of hiatal hernia   . History of TIA (transient ischemic attack)    a. Head CT 6/15: small chronic lacunar infarct in thalamus  . Hyperlipidemia   . OA (osteoarthritis)   . Osteoporosis   . Peripheral neuropathy   . Pneumonia   . Pulmonary HTN ([place])    a. PASP on Echo in 9/12:  59 mmHg  . Renal insufficiency   . Sigmoid diverticulosis    s/p sigmoid colectomy  . Stroke ([place])    TIAs  . Wears dentures   . Wears glasses      Family History  Problem Relation Age of Onset  . Osteoarthritis Mother   . Heart failure Mother   . Colon cancer Maternal Uncle 15  . Heart Problems Father   . Cancer Sister 53       ? colon cancer   . Kidney cancer Brother 80  . Esophageal cancer Neg Hx   . Rectal cancer  Neg Hx   . Stomach cancer Neg Hx   . Pancreatic cancer Neg Hx      Social History   Socioeconomic History  . Marital status: Widowed    Spouse name: Not on file  . Number of children: Not on file  . Years of education: Not on file  . Highest education level: Not on file  Occupational History  . Not on file  Social Needs  . Financial resource strain: Not on file  . Food insecurity    Worry: Never true    Inability: Never true  . Transportation needs    Medical: No    Non-medical: No  Tobacco Use  . Smoking status: Former Smoker    Packs/day: 0.50    Years: 16.00    Pack years: 8.00    Types: Cigarettes    Start date: 85    Quit date: 07/16/1967    Years since quitting: 51.7  . Smokeless tobacco: Never Used  Substance and Sexual Activity  . Alcohol use: No    Alcohol/week: 0.0 standard drinks  . Drug use: No  . Sexual  activity: Not on file  Lifestyle  . Physical activity    Days per week: Not on file    Minutes per session: Not on file  . Stress: Not on file  Relationships  . Social Herbalist on phone: Not on file    Gets together: Not on file    Attends religious service: Not on file    Active member of club or organization: Not on file    Attends meetings of clubs or organizations: Not on file    Relationship status: Not on file  . Intimate partner violence    Fear of current or ex partner: Not on file    Emotionally abused: Not on file    Physically abused: Not on file    Forced sexual activity: Not on file  Other Topics Concern  . Not on file  Social History Narrative   Married, 2 children.  As of November 2015 her husband has been living in a nursing home for tendon after years as he suffers from multiple medical problems.   Patient does not drink alcohol nor does she smoke or chew tobacco products   Right handed   8th grade   1 cup daily     No Known Allergies   Outpatient Medications Prior to Visit  Medication Sig Dispense Refill  . acetaminophen (TYLENOL) 325 MG tablet Take 650 mg by mouth every 6 (six) hours as needed for moderate pain.     Marland Kitchen albuterol (PROVENTIL HFA;VENTOLIN HFA) 108 (90 Base) MCG/ACT inhaler Inhale 2 puffs into the lungs every 6 (six) hours as needed for wheezing or shortness of breath. 1 Inhaler 3  . Ascorbic Acid (VITAMIN C) 1000 MG tablet Take 1,000 mg by mouth daily.    . ferrous sulfate 325 (65 FE) MG EC tablet Take 325 mg by mouth daily with breakfast.    . fluticasone (FLOVENT HFA) 44 MCG/ACT inhaler Inhale 2 puffs into the lungs 2 (two) times daily. 1 Inhaler 0  . furosemide (LASIX) 20 MG tablet Take 2 tablets (40 mg total) by mouth daily. 180 tablet 3  . LORazepam (ATIVAN) 0.5 MG tablet 1 tablet twice daily as needed for anxiety or sleep 60 tablet 2  . Spacer/Aero-Holding Chambers (E-Z SPACER) inhaler Use as instructed 1 each 2  . traZODone  (DESYREL) 100 MG tablet TAKE 1/2 (  ONE-HALF) TABLET BY MOUTH AT BEDTIME 45 tablet 1  . triamcinolone cream (KENALOG) 0.1 % Apply 1 application topically 2 (two) times daily as needed (Eczema on legs). 30 g 2  . albuterol (PROAIR HFA) 108 (90 Base) MCG/ACT inhaler Inhale 2 puffs into the lungs every 6 (six) hours as needed for wheezing or shortness of breath. 1 Inhaler 5  . apixaban (ELIQUIS) 5 MG TABS tablet Take 1 tablet (5 mg total) by mouth 2 (two) times daily. 180 tablet 3  . benazepril (LOTENSIN) 40 MG tablet Take 1 tablet (40 mg total) by mouth daily. 90 tablet 4  . colchicine 0.6 MG tablet Take 1 tablet (0.6 mg total) by mouth daily. 20 tablet 2  . diltiazem (CARDIZEM CD) 120 MG 24 hr capsule Take 1 capsule by mouth as needed when Heart Rate is above 503 and Systolic Blood Pressure is above 100 mm/hg 90 capsule 3  . esomeprazole (NEXIUM) 40 MG capsule Take 1 capsule (40 mg total) by mouth daily. 90 capsule 4  . gabapentin (NEURONTIN) 600 MG tablet Take 1 tablet (600 mg total) by mouth 2 (two) times daily. 180 tablet 3  . HYDROcodone-acetaminophen (NORCO/VICODIN) 5-325 MG tablet Take 1-2 tablets by mouth every 6 (six) hours as needed for moderate pain. 10 tablet 0  . metoprolol tartrate (LOPRESSOR) 50 MG tablet Take 1.5 tablets (75 mg total) by mouth 2 (two) times daily. 270 tablet 3  . nitroGLYCERIN (NITROSTAT) 0.4 MG SL tablet Place 1 tablet (0.4 mg total) under the tongue every 5 (five) minutes as needed for chest pain (x 3 pills). Reported on 09/01/2015 25 tablet 3  . sertraline (ZOLOFT) 50 MG tablet Take 1 tablet (50 mg total) by mouth daily. 90 tablet 3  . Tiotropium Bromide Monohydrate (SPIRIVA RESPIMAT) 2.5 MCG/ACT AERS Inhale 2 puffs into the lungs daily. 1 Inhaler 0   No facility-administered medications prior to visit.     Review of Systems  Constitutional: Negative for chills, diaphoresis, fever, malaise/fatigue and weight loss.  HENT: Negative for congestion, ear pain and sore  throat.   Respiratory: Positive for cough, shortness of breath and wheezing. Negative for hemoptysis and sputum production.   Cardiovascular: Positive for chest pain. Negative for palpitations and leg swelling.  Gastrointestinal: Negative for abdominal pain, heartburn and nausea.  Genitourinary: Negative for frequency.  Musculoskeletal: Positive for myalgias. Negative for joint pain.  Skin: Negative for itching and rash.  Neurological: Positive for weakness. Negative for dizziness and headaches.  Endo/Heme/Allergies: Does not bruise/bleed easily.      Objective:   Vitals:   04/05/19 1015  BP: 124/78  Pulse: 83  Temp: (!) 97 F (36.1 C)  TempSrc: Temporal  SpO2: 98%  Weight: 187 lb (84.8 kg)  Height: 5' 3.5" (1.613 m)   Physical Exam: General: Elderly-appearing, no acute distress HENT: [place], AT, OP clear, MMM Eyes: EOMI, no scleral icterus Respiratory: Diminished breath sounds. No crackles, wheezing or rales Cardiovascular: RRR, -M/R/G, no JVD GI: BS+, soft, nontender Musculoskeletal: Mild right chest tenderness with palpation Neuro: AAO x4, CNII-XII grossly intact Skin: Intact, no rashes or bruising Psych: Normal mood, normal affect  Chest imaging: CT Chest WO Contrast 04/06/18 - stable 2 mm nodular opacity in the right lower lobe. No pleural effusion evident. There is mild underlying centrilobular emphysematous change.  CT Chest 12/18/18 - No lymphadenopathy, centrilobular emphysema, stable 2 mm nodular opacity in superior segment right lower lobe  PFT: None on file  Echo: TTE 10/28/17 Study Conclusions  -  Left ventricle: The cavity size was normal. There was severe   focal basal and moderate concentric hypertrophy. Systolic   function was normal. The estimated ejection fraction was in the   range of 55% to 60%. Wall motion was normal; there were no   regional wall motion abnormalities. The study was not technically   sufficient to allow evaluation of LV diastolic  dysfunction due to   atrial fibrillation. - Aortic valve: Mildly to moderately calcified annulus. Trileaflet;   normal thickness, mildly calcified leaflets. - Mitral valve: There was moderate regurgitation. - Left atrium: The atrium was severely dilated. - Tricuspid valve: There was moderate regurgitation. - Pulmonic valve: There was trivial regurgitation. - Pulmonary arteries: PA peak pressure: 50 mm Hg (S).  Impressions:  - The right ventricular systolic pressure was increased consistent   with moderate pulmonary hypertension.  Imaging, labs and test noted above have been reviewed independently by me.    Assessment & Plan:   Centrilobular emphysema (HCC)  Chest pain, musculoskeletal  Abnormal CT scan of lung  Need for immunization against influenza - Plan: Flu Vaccine QUAD High Dose(Fluad)  Discussion: 85 year old female with emphysema and hx of colon cancer s/p resection with hx of borderline mediastinal adenopathy that has improved on recent CT imaging. Reviewed imaging with patient and her daughter, addressing questions. Her main complaint this visit is dyspnea. She did not tolerate LABA/LAMA in the past but did do well on LAMA alone.   Emphysema RESTART Spiriva 2.5 mcg two puffs twice a day CONTINUE Albuterol as needed for shortness of breath or wheezing  Right musculoskeletal chest pain Recommend massage and stretching at this site  Abnormal CT scan Improved lymph node sizes. Will evaluate CT Chest in one year. Will order imaging when closer to 12/2019  Immunization History  Administered Date(s) Administered  . Fluad Quad(high Dose 65+) 04/05/2019  . Influenza Split 06/21/2011, 04/21/2012  . Influenza Whole 05/19/2007, 04/05/2008, 04/26/2010  . Influenza, High Dose Seasonal PF 05/17/2015, 05/22/2016, 04/11/2017, 04/01/2018  . Influenza,inj,Quad PF,6+ Mos 03/10/2013  . Influenza-Unspecified 04/14/2014  . PPD Test 06/10/2014, 06/24/2014, 08/18/2015  .  Pneumococcal Conjugate-13 05/17/2015  . Pneumococcal Polysaccharide-23 02/10/2013  . Tdap 11/19/2010   Follow-up in 3 months   Greater than 50% of this patient 25-minute office visit was spent face-to-face in counseling with the patient/family. We discussed medical diagnosis and treatment plan as noted.  Chi Rodman Pickle, MD [facility] 04/06/2019 3:45 PM  Personal pager: [phone] If unanswered, please page CCM On-call: [phone]

## 2019-04-05 NOTE — Progress Notes (Signed)
Patient seen in the office today and instructed on use of spiriva respimat .  Patient expressed understanding and demonstrated technique. 

## 2019-04-05 NOTE — Patient Instructions (Signed)
Emphysema RESTART Spiriva 2.5 mcg two puffs twice a day CONTINUE Albuterol as needed for shortness of breath or wheezing  Right musculoskeletal chest pain Recommend massage and stretching at this site  Abnormal CT scan Improved lymph node sizes. Will evaluate CT Chest in one year. Will order imaging when closer to 12/2019  Follow-up with me in 3 months

## 2019-04-06 ENCOUNTER — Telehealth (INDEPENDENT_AMBULATORY_CARE_PROVIDER_SITE_OTHER): Payer: HMO | Admitting: Adult Health

## 2019-04-06 DIAGNOSIS — Z760 Encounter for issue of repeat prescription: Secondary | ICD-10-CM

## 2019-04-06 DIAGNOSIS — J432 Centrilobular emphysema: Secondary | ICD-10-CM | POA: Insufficient documentation

## 2019-04-06 DIAGNOSIS — L03116 Cellulitis of left lower limb: Secondary | ICD-10-CM | POA: Diagnosis not present

## 2019-04-06 DIAGNOSIS — J439 Emphysema, unspecified: Secondary | ICD-10-CM | POA: Insufficient documentation

## 2019-04-06 MED ORDER — DILTIAZEM HCL ER COATED BEADS 120 MG PO CP24: ORAL_CAPSULE | ORAL | 0 refills | Status: DC

## 2019-04-06 MED ORDER — METOPROLOL TARTRATE 50 MG PO TABS: 75.0000 mg | ORAL_TABLET | Freq: Two times a day (BID) | ORAL | 0 refills | Status: DC

## 2019-04-06 MED ORDER — APIXABAN 5 MG PO TABS: 5.0000 mg | ORAL_TABLET | Freq: Two times a day (BID) | ORAL | 0 refills | Status: DC

## 2019-04-06 MED ORDER — SERTRALINE HCL 50 MG PO TABS: 50.0000 mg | ORAL_TABLET | Freq: Every day | ORAL | 0 refills | Status: DC

## 2019-04-06 MED ORDER — BENAZEPRIL HCL 40 MG PO TABS: 40.0000 mg | ORAL_TABLET | Freq: Every day | ORAL | 0 refills | Status: DC

## 2019-04-06 MED ORDER — NITROGLYCERIN 0.4 MG SL SUBL: 0.4000 mg | SUBLINGUAL_TABLET | SUBLINGUAL | 3 refills | Status: AC | PRN

## 2019-04-06 MED ORDER — GABAPENTIN 600 MG PO TABS: 600.0000 mg | ORAL_TABLET | Freq: Two times a day (BID) | ORAL | 0 refills | Status: DC

## 2019-04-06 MED ORDER — DOXYCYCLINE HYCLATE 100 MG PO CAPS: 100.0000 mg | ORAL_CAPSULE | Freq: Two times a day (BID) | ORAL | 0 refills | Status: AC

## 2019-04-06 MED ORDER — ESOMEPRAZOLE MAGNESIUM 40 MG PO CPDR: 40.0000 mg | DELAYED_RELEASE_CAPSULE | Freq: Every day | ORAL | 0 refills | Status: DC

## 2019-04-06 NOTE — Progress Notes (Signed)
Virtual Visit via Video Note  I connected with [name] on 04/06/19 at  3:00 PM EDT by a video enabled telemedicine application and verified that I am speaking with the correct person using two identifiers.  Location patient: home Location provider:work or home office Persons participating in the virtual visit: patient, provider  I discussed the limitations of evaluation and management by telemedicine and the availability of in person appointments. The patient expressed understanding and agreed to proceed.   HPI: 85 year old female who is being evaluated today for an acute issue.  Symptoms started "several days ago".  Symptoms have not improved nor have they become worse.  Symptoms include redness, mild pain, and slight swelling to the left leg.  There is no redness, warmth, or tenderness to the left calf.  All of her symptoms are on the front of the leg.  Denies any trauma to the leg.  She denies chest pain or shortness of breath.  She is taking Eliquis on a daily basis.  Additionally, she needs her medications refilled   ROS: See pertinent positives and negatives per HPI.  Past Medical History:  Diagnosis Date  . Adenocarcinoma, colon [facility]) Oncologist-- Dr Truitt Merle   Multifocal (2) Colon cancer @ ileocecal valve and ascending ( mT4N1Mx), Stage IIIB, grade I, MMR normal , 2 of 16 +lymph nodes, negative surgical margins---  06-02-2014  Right hemicolectomy w/ colostomy  . Allergy   . Anxiety   . Asthma    inhaler "sometimes"  . CAD (coronary artery disease)    a. LHC 4/09: pLAD 40, mDx 70-75, pLCx 30, mLCx 50, mRCA 90, EF 60% >> PCI: BMS x2 to RCA;  b. Myoview 8/12: normal  . Cataract    bil cateracts removed  . Chronic anemia   . Chronic diastolic CHF (congestive heart failure) (HCC)    a. Echo 912 - Mild LVH, EF 55-60%, no RWMA, Gr 1 DD, mild MR, mild LAE, mild RAE, PASP 59 mmHg (mod to severe pulmo HTN)  . Clotting disorder (HCC)    hx of dvt, tia on eliquis  . Colostomy in  place [facility])    s/p colostomy takedown 5/16  . Colovaginal fistula    s/p colostomy >> colostomy takedown 5/16  . COPD with chronic bronchitis ([place])   . Depression   . Dysrhythmia    A fib  . Essential hypertension   . GERD (gastroesophageal reflux disease)   . History of adenomatous polyp of colon   . History of blood transfusion   . History of DVT of lower extremity   . History of hiatal hernia   . History of TIA (transient ischemic attack)    a. Head CT 6/15: small chronic lacunar infarct in thalamus  . Hyperlipidemia   . OA (osteoarthritis)   . Osteoporosis   . Peripheral neuropathy   . Pneumonia   . Pulmonary HTN ([place])    a. PASP on Echo in 9/12:  59 mmHg  . Renal insufficiency   . Sigmoid diverticulosis    s/p sigmoid colectomy  . Stroke ([place])    TIAs  . Wears dentures   . Wears glasses     Past Surgical History:  Procedure Laterality Date  . ABDOMINAL HYSTERECTOMY    . ANTERIOR CERVICAL DECOMP/DISCECTOMY FUSION  01-31-2009   C5 -- 7  . APPENDECTOMY    . Bone spurs Bilateral    Feet  . CARDIOVASCULAR STRESS TEST  02-26-2011   Normal lexiscan no exercise study/  no ischemia/  normal LV function and wall motion, ef 67%  . CARPAL TUNNEL RELEASE Right   . CATARACT EXTRACTION W/ INTRAOCULAR LENS  IMPLANT, BILATERAL Bilateral   . CHOLECYSTECTOMY    . COLOSTOMY N/A 06/02/2014   Procedure: DIVERTING DESCENDING END COLOSTOMY;  Surgeon: Donnie Mesa, MD;  Location: [place];  Service: General;  Laterality: N/A;  . CORONARY ANGIOPLASTY WITH STENT PLACEMENT  11-04-2007  dr bensimhon   BMS x2 to RCA/  mild Non-obstructive disease LAD/  normal LVF  . DILATION AND CURETTAGE OF UTERUS    . ESOPHAGOGASTRODUODENOSCOPY N/A 10/28/2017   Procedure: ESOPHAGOGASTRODUODENOSCOPY (EGD);  Surgeon: Yetta Flock, MD;  Location[facility] ENDOSCOPY;  Service: Gastroenterology;  Laterality: N/A;  . EVALUATION UNDER ANESTHESIA WITH ANAL FISTULECTOMY N/A 10/13/2014   Procedure: ANAL EXAM UNDER  ANESTHESIA ;  Surgeon: [name], MD;  Location: [facility];  Service: General;  Laterality: N/A;  . HAND SURGERY     Tendon repair  . LAPAROSCOPIC SIGMOID COLECTOMY N/A 11/24/2014   Procedure: SIGMOID COLECTOMY AND COLSOTOMY CLOSURE;  Surgeon: Donnie Mesa, MD;  Location: [place];  Service: General;  Laterality: N/A;  . LUMBAR LAMINECTOMY  10-29-2002,  1969   Left  L3 -- 4  decompression  . PARTIAL COLECTOMY N/A 06/02/2014   Procedure: RIGHT PARTIAL COLECTOMY ;  Surgeon: Donnie Mesa, MD;  Location: [place];  Service: General;  Laterality: N/A;  . PATELLECTOMY Right 09/14/2013   Procedure: PATELLECTOMY;  Surgeon: Meredith Pel, MD;  Location: [place];  Service: Orthopedics;  Laterality: Right;  . PROCTOSCOPY N/A 10/13/2014   Procedure: RIDGE PROCTOSCOPY;  Surgeon: [name], MD;  Location: [facility];  Service: General;  Laterality: N/A;  . REVISION TOTAL KNEE ARTHROPLASTY Bilateral right  04-02-2011/  left 1996 & 10-05-1999  . ROTATOR CUFF REPAIR Bilateral   . TONSILLECTOMY    . TOTAL KNEE ARTHROPLASTY Bilateral left 1994/  right 2000  . TOTAL KNEE REVISION Left 08/02/2015   Procedure: LEFT FEMORAL REVISION;  Surgeon: Gaynelle Arabian, MD;  Location: [facility];  Service: Orthopedics;  Laterality: Left;  . TRANSTHORACIC ECHOCARDIOGRAM  12-01-2009   Grade I diastolic dysfunction/  ef 60%/  moderate MR/  mild TR    Family History  Problem Relation Age of Onset  . Osteoarthritis Mother   . Heart failure Mother   . Colon cancer Maternal Uncle 73  . Heart Problems Father   . Cancer Sister 52       ? colon cancer   . Kidney cancer Brother 80  . Esophageal cancer Neg Hx   . Rectal cancer Neg Hx   . Stomach cancer Neg Hx   . Pancreatic cancer Neg Hx     Current Outpatient Medications:  .  acetaminophen (TYLENOL) 325 MG tablet, Take 650 mg by mouth every 6 (six) hours as needed for moderate pain. , Disp: , Rfl:  .  albuterol (PROAIR HFA) 108 (90 Base) MCG/ACT inhaler, Inhale 2 puffs into the  lungs every 6 (six) hours as needed for wheezing or shortness of breath., Disp: 6.7 g, Rfl: 6 .  albuterol (PROVENTIL HFA;VENTOLIN HFA) 108 (90 Base) MCG/ACT inhaler, Inhale 2 puffs into the lungs every 6 (six) hours as needed for wheezing or shortness of breath., Disp: 1 Inhaler, Rfl: 3 .  apixaban (ELIQUIS) 5 MG TABS tablet, Take 1 tablet (5 mg total) by mouth 2 (two) times daily., Disp: 180 tablet, Rfl: 3 .  Ascorbic Acid (VITAMIN C) 1000 MG tablet, Take 1,000  mg by mouth daily., Disp: , Rfl:  .  benazepril (LOTENSIN) 40 MG tablet, Take 1 tablet (40 mg total) by mouth daily., Disp: 90 tablet, Rfl: 4 .  colchicine 0.6 MG tablet, Take 1 tablet (0.6 mg total) by mouth daily., Disp: 20 tablet, Rfl: 2 .  diltiazem (CARDIZEM CD) 120 MG 24 hr capsule, Take 1 capsule by mouth as needed when Heart Rate is above 073 and Systolic Blood Pressure is above 100 mm/hg, Disp: 90 capsule, Rfl: 3 .  esomeprazole (NEXIUM) 40 MG capsule, Take 1 capsule (40 mg total) by mouth daily., Disp: 90 capsule, Rfl: 4 .  ferrous sulfate 325 (65 FE) MG EC tablet, Take 325 mg by mouth daily with breakfast., Disp: , Rfl:  .  fluticasone (FLOVENT HFA) 44 MCG/ACT inhaler, Inhale 2 puffs into the lungs 2 (two) times daily., Disp: 1 Inhaler, Rfl: 0 .  furosemide (LASIX) 20 MG tablet, Take 2 tablets (40 mg total) by mouth daily., Disp: 180 tablet, Rfl: 3 .  gabapentin (NEURONTIN) 600 MG tablet, Take 1 tablet (600 mg total) by mouth 2 (two) times daily., Disp: 180 tablet, Rfl: 3 .  HYDROcodone-acetaminophen (NORCO/VICODIN) 5-325 MG tablet, Take 1-2 tablets by mouth every 6 (six) hours as needed for moderate pain., Disp: 10 tablet, Rfl: 0 .  LORazepam (ATIVAN) 0.5 MG tablet, 1 tablet twice daily as needed for anxiety or sleep, Disp: 60 tablet, Rfl: 2 .  metoprolol tartrate (LOPRESSOR) 50 MG tablet, Take 1.5 tablets (75 mg total) by mouth 2 (two) times daily., Disp: 270 tablet, Rfl: 3 .  nitroGLYCERIN (NITROSTAT) 0.4 MG SL tablet, Place 1  tablet (0.4 mg total) under the tongue every 5 (five) minutes as needed for chest pain (x 3 pills). Reported on 09/01/2015, Disp: 25 tablet, Rfl: 3 .  sertraline (ZOLOFT) 50 MG tablet, Take 1 tablet (50 mg total) by mouth daily., Disp: 90 tablet, Rfl: 3 .  Spacer/Aero-Holding Chambers (E-Z SPACER) inhaler, Use as instructed, Disp: 1 each, Rfl: 2 .  Tiotropium Bromide Monohydrate (SPIRIVA RESPIMAT) 2.5 MCG/ACT AERS, Inhale 2 puffs into the lungs daily., Disp: 4 g, Rfl: 3 .  Tiotropium Bromide Monohydrate (SPIRIVA RESPIMAT) 2.5 MCG/ACT AERS, Inhale 2 puffs into the lungs daily., Disp: 4 g, Rfl: 0 .  traZODone (DESYREL) 100 MG tablet, TAKE 1/2 (ONE-HALF) TABLET BY MOUTH AT BEDTIME, Disp: 45 tablet, Rfl: 1 .  triamcinolone cream (KENALOG) 0.1 %, Apply 1 application topically 2 (two) times daily as needed (Eczema on legs)., Disp: 30 g, Rfl: 2  EXAM:  VITALS per patient if applicable:  GENERAL: alert, oriented, appears well and in no acute distress  HEENT: atraumatic, conjunttiva clear, no obvious abnormalities on inspection of external nose and ears  NECK: normal movements of the head and neck  LUNGS: on inspection no signs of respiratory distress, breathing rate appears normal, no obvious gross SOB, gasping or wheezing  CV: no obvious cyanosis  MS: moves all visible extremities without noticeable abnormality  PSYCH/NEURO: pleasant and cooperative, no obvious depression or anxiety, speech and thought processing grossly intact  SKIN: Scattered areas of erythemic rash noted on left leg.  Does appear to have a wound on the medial aspect of the left leg.  Light swelling noted.  ASSESSMENT AND PLAN:  Discussed the following assessment and plan:  1. Cellulitis of left lower extremity - Does not appear as DVT. Will prescribe Doxycycline. Advised follow up if no improvement by the end of the week - doxycycline (VIBRAMYCIN) 100 MG capsule;  Take 1 capsule (100 mg total) by mouth 2 (two) times daily  for 14 days.  Dispense: 28 capsule; Refill: 0   2. Medication refill  - apixaban (ELIQUIS) 5 MG TABS tablet; Take 1 tablet (5 mg total) by mouth 2 (two) times daily.  Dispense: 180 tablet; Refill: 0 - benazepril (LOTENSIN) 40 MG tablet; Take 1 tablet (40 mg total) by mouth daily.  Dispense: 90 tablet; Refill: 0 - diltiazem (CARDIZEM CD) 120 MG 24 hr capsule; Take 1 capsule by mouth as needed when Heart Rate is above 025 and Systolic Blood Pressure is above 100 mm/hg  Dispense: 90 capsule; Refill: 0 - esomeprazole (NEXIUM) 40 MG capsule; Take 1 capsule (40 mg total) by mouth daily.  Dispense: 90 capsule; Refill: 0 - gabapentin (NEURONTIN) 600 MG tablet; Take 1 tablet (600 mg total) by mouth 2 (two) times daily.  Dispense: 180 tablet; Refill: 0 - metoprolol tartrate (LOPRESSOR) 50 MG tablet; Take 1.5 tablets (75 mg total) by mouth 2 (two) times daily.  Dispense: 270 tablet; Refill: 0 - nitroGLYCERIN (NITROSTAT) 0.4 MG SL tablet; Place 1 tablet (0.4 mg total) under the tongue every 5 (five) minutes as needed for chest pain (x 3 pills). Reported on 09/01/2015  Dispense: 25 tablet; Refill: 3 - sertraline (ZOLOFT) 50 MG tablet; Take 1 tablet (50 mg total) by mouth daily.  Dispense: 90 tablet; Refill: 0     I discussed the assessment and treatment plan with the patient. The patient was provided an opportunity to ask questions and all were answered. The patient agreed with the plan and demonstrated an understanding of the instructions.   The patient was advised to call back or seek an in-person evaluation if the symptoms worsen or if the condition fails to improve as anticipated.   [name], NP

## 2019-04-12 ENCOUNTER — Ambulatory Visit: Payer: Self-pay | Admitting: Pharmacist

## 2019-04-19 ENCOUNTER — Other Ambulatory Visit: Payer: Self-pay | Admitting: Cardiovascular Disease

## 2019-04-20 ENCOUNTER — Ambulatory Visit: Payer: Self-pay | Admitting: Pharmacist

## 2019-04-23 ENCOUNTER — Telehealth: Payer: Self-pay | Admitting: Cardiovascular Disease

## 2019-04-23 MED ORDER — FUROSEMIDE 20 MG PO TABS: 40.0000 mg | ORAL_TABLET | Freq: Every day | ORAL | 0 refills | Status: DC

## 2019-04-23 NOTE — Telephone Encounter (Signed)
Pt's medication was sent to pt's pharmacy as requested. Confirmation received.  °

## 2019-04-23 NOTE — Telephone Encounter (Signed)
°*  STAT* If patient is at the pharmacy, call can be transferred to refill team.   1. Which medications need to be refilled? (please list name of each medication and dose if known) furosemide 20 mg  2. Which pharmacy/location (including street and city if local pharmacy) is medication to be sent to? [facility]    3. Do they need a 30 day or 90 day supply? Patient needs the other half patient now has a appt.

## 2019-04-26 ENCOUNTER — Ambulatory Visit: Payer: Self-pay | Admitting: Pharmacist

## 2019-05-04 ENCOUNTER — Ambulatory Visit: Payer: Self-pay | Admitting: Pharmacist

## 2019-05-11 ENCOUNTER — Ambulatory Visit: Payer: Self-pay | Admitting: Pharmacist

## 2019-05-11 ENCOUNTER — Other Ambulatory Visit: Payer: Self-pay | Admitting: Pharmacist

## 2019-05-17 NOTE — Patient Outreach (Signed)
[facility]) Care Management [place]  05/11/2019  Rayette Mallas Rawling 17-Oct-1929 [id number]  Reason for referral: HTA CSNP  Successful outreach to Ms Ancheta with HIPAA identifiers verified.  Patient states she has all medications and has no needs (medications have bene supplied by patient assistance/[employer]).  She states her BP has been stable and she denies s/sx of bleeding on Eliquis.  Her breathing has been normal.  Encouraged patient to call if needs arise.   Regina Eck, PharmD, [place]  [phone]

## 2019-06-01 DIAGNOSIS — Z961 Presence of intraocular lens: Secondary | ICD-10-CM | POA: Diagnosis not present

## 2019-06-01 DIAGNOSIS — H5022 Vertical strabismus, left eye: Secondary | ICD-10-CM | POA: Diagnosis not present

## 2019-06-01 DIAGNOSIS — H53433 Sector or arcuate defects, bilateral: Secondary | ICD-10-CM | POA: Diagnosis not present

## 2019-06-01 DIAGNOSIS — H353131 Nonexudative age-related macular degeneration, bilateral, early dry stage: Secondary | ICD-10-CM | POA: Diagnosis not present

## 2019-06-07 ENCOUNTER — Other Ambulatory Visit: Payer: Self-pay

## 2019-06-08 ENCOUNTER — Ambulatory Visit (INDEPENDENT_AMBULATORY_CARE_PROVIDER_SITE_OTHER): Payer: HMO | Admitting: Adult Health

## 2019-06-08 ENCOUNTER — Ambulatory Visit (INDEPENDENT_AMBULATORY_CARE_PROVIDER_SITE_OTHER): Payer: HMO

## 2019-06-08 ENCOUNTER — Encounter: Payer: Self-pay | Admitting: Adult Health

## 2019-06-08 ENCOUNTER — Other Ambulatory Visit: Payer: Self-pay | Admitting: Adult Health

## 2019-06-08 ENCOUNTER — Telehealth: Payer: Self-pay | Admitting: Cardiovascular Disease

## 2019-06-08 VITALS — BP 136/80 | Temp 97.6°F | Wt 183.0 lb

## 2019-06-08 DIAGNOSIS — M25562 Pain in left knee: Secondary | ICD-10-CM | POA: Diagnosis not present

## 2019-06-08 DIAGNOSIS — Z471 Aftercare following joint replacement surgery: Secondary | ICD-10-CM | POA: Diagnosis not present

## 2019-06-08 DIAGNOSIS — M542 Cervicalgia: Secondary | ICD-10-CM

## 2019-06-08 DIAGNOSIS — G8929 Other chronic pain: Secondary | ICD-10-CM | POA: Diagnosis not present

## 2019-06-08 DIAGNOSIS — R42 Dizziness and giddiness: Secondary | ICD-10-CM

## 2019-06-08 DIAGNOSIS — M79641 Pain in right hand: Secondary | ICD-10-CM

## 2019-06-08 DIAGNOSIS — S8992XA Unspecified injury of left lower leg, initial encounter: Secondary | ICD-10-CM | POA: Diagnosis not present

## 2019-06-08 DIAGNOSIS — Z96652 Presence of left artificial knee joint: Secondary | ICD-10-CM | POA: Diagnosis not present

## 2019-06-08 DIAGNOSIS — M25511 Pain in right shoulder: Secondary | ICD-10-CM

## 2019-06-08 MED ORDER — TRIAMCINOLONE ACETONIDE 0.1 % EX CREA: 1.0000 "application " | TOPICAL_CREAM | Freq: Two times a day (BID) | CUTANEOUS | 2 refills | Status: DC | PRN

## 2019-06-08 NOTE — Telephone Encounter (Signed)
Spoke with patient's daughter, [name], and advised her that she [place] accompany her mother to the appointment and advised her of the procedures in our lobby when they arrived. She thanked me for the call.

## 2019-06-08 NOTE — Telephone Encounter (Signed)
° °  Daughter requesting to accompany patient at 11/25 appointment, caregiver

## 2019-06-08 NOTE — Addendum Note (Signed)
Addended by: Suzette Battiest on: 06/08/2019 12:46 PM   Modules accepted: Orders

## 2019-06-08 NOTE — Progress Notes (Signed)
Subjective:    Patient ID: [name], female    DOB: 03-03-1930, 85 y.o.   MRN: [id number]  HPI  85 year old female who  has a past medical history of Adenocarcinoma, colon ([place]) (Oncologist-- Dr Truitt Merle), Allergy, Anxiety, Asthma, CAD (coronary artery disease), Cataract, Chronic anemia, Chronic diastolic CHF (congestive heart failure) ([place]), Clotting disorder ([place]), Colostomy in place [facility]), Colovaginal fistula, COPD with chronic bronchitis ([place]), Depression, Dysrhythmia, Essential hypertension, GERD (gastroesophageal reflux disease), History of adenomatous polyp of colon, History of blood transfusion, History of DVT of lower extremity, History of hiatal hernia, History of TIA (transient ischemic attack), Hyperlipidemia, OA (osteoarthritis), Osteoporosis, Peripheral neuropathy, Pneumonia, Pulmonary HTN ([place]), Renal insufficiency, Sigmoid diverticulosis, Stroke ([place]), Wears dentures, and Wears glasses.  She presents to the office today for multiple painful areas after falling at home approximately 4 weeks ago.  She reports that she has fallen 3-4 times this year.  This most recent time she was walking in her home and became a little bit dizzy, she fell backwards.  She does report hitting the back of her head, she did not go to the emergency room due to being on a blood thinner.  She denies loss of consciousness.  She reports in the fall she hurt her right shoulder, left knee, has cervical spine pain, and right hand pain.  Pain seems to be returning to baseline, she has had 4 knee surgeries on her left knee and has also had surgery on her cervical spine as well as right shoulder.  Once the fall she does report increased loss of range of motion of the right shoulder, she can no longer reach things in her cabinet.  She has no loss of grip strength or numbness and tingling down her right arm.  She is walking and is using her rolling walker with a seat as well as her cane at home.  She denies dizziness,  lightheadedness, chest pain, or shortness of breath.  She has not experienced any blurred vision  BP Readings from Last 3 Encounters:  06/08/19 136/80  04/05/19 124/78  07/31/18 138/80    Review of Systems See HPI   Past Medical History:  Diagnosis Date  . Adenocarcinoma, colon [facility]) Oncologist-- Dr Truitt Merle   Multifocal (2) Colon cancer @ ileocecal valve and ascending ( [id number]), Stage IIIB, grade I, MMR normal , 2 of 16 +lymph nodes, negative surgical margins---  06-02-2014  Right hemicolectomy w/ colostomy  . Allergy   . Anxiety   . Asthma    inhaler "sometimes"  . CAD (coronary artery disease)    a. LHC 4/09: pLAD 40, mDx 70-75, pLCx 30, mLCx 50, mRCA 90, EF 60% >> PCI: BMS x2 to RCA;  b. Myoview 8/12: normal  . Cataract    bil cateracts removed  . Chronic anemia   . Chronic diastolic CHF (congestive heart failure) (HCC)    a. Echo 912 - Mild LVH, EF 55-60%, no RWMA, Gr 1 DD, mild MR, mild LAE, mild RAE, PASP 59 mmHg (mod to severe pulmo HTN)  . Clotting disorder (HCC)    hx of dvt, tia on eliquis  . Colostomy in place [facility])    s/p colostomy takedown 5/16  . Colovaginal fistula    s/p colostomy >> colostomy takedown 5/16  . COPD with chronic bronchitis ([place])   . Depression   . Dysrhythmia    A fib  . Essential hypertension   . GERD (gastroesophageal reflux disease)   .  History of adenomatous polyp of colon   . History of blood transfusion   . History of DVT of lower extremity   . History of hiatal hernia   . History of TIA (transient ischemic attack)    a. Head CT 6/15: small chronic lacunar infarct in thalamus  . Hyperlipidemia   . OA (osteoarthritis)   . Osteoporosis   . Peripheral neuropathy   . Pneumonia   . Pulmonary HTN ([place])    a. PASP on Echo in 9/12:  59 mmHg  . Renal insufficiency   . Sigmoid diverticulosis    s/p sigmoid colectomy  . Stroke ([place])    TIAs  . Wears dentures   . Wears glasses     Social History   Socioeconomic History  .  Marital status: Widowed    Spouse name: Not on file  . Number of children: Not on file  . Years of education: Not on file  . Highest education level: Not on file  Occupational History  . Not on file  Social Needs  . Financial resource strain: Not on file  . Food insecurity    Worry: Never true    Inability: Never true  . Transportation needs    Medical: No    Non-medical: No  Tobacco Use  . Smoking status: Former Smoker    Packs/day: 0.50    Years: 16.00    Pack years: 8.00    Types: Cigarettes    Start date: 75    Quit date: 07/16/1967    Years since quitting: 51.9  . Smokeless tobacco: Never Used  Substance and Sexual Activity  . Alcohol use: No    Alcohol/week: 0.0 standard drinks  . Drug use: No  . Sexual activity: Not on file  Lifestyle  . Physical activity    Days per week: Not on file    Minutes per session: Not on file  . Stress: Not on file  Relationships  . Social Herbalist on phone: Not on file    Gets together: Not on file    Attends religious service: Not on file    Active member of club or organization: Not on file    Attends meetings of clubs or organizations: Not on file    Relationship status: Not on file  . Intimate partner violence    Fear of current or ex partner: Not on file    Emotionally abused: Not on file    Physically abused: Not on file    Forced sexual activity: Not on file  Other Topics Concern  . Not on file  Social History Narrative   Married, 2 children.  As of November 2015 her husband has been living in a nursing home for tendon after years as he suffers from multiple medical problems.   Patient does not drink alcohol nor does she smoke or chew tobacco products   Right handed   8th grade   1 cup daily    Past Surgical History:  Procedure Laterality Date  . ABDOMINAL HYSTERECTOMY    . ANTERIOR CERVICAL DECOMP/DISCECTOMY FUSION  01-31-2009   C5 -- 7  . APPENDECTOMY    . Bone spurs Bilateral    Feet  .  CARDIOVASCULAR STRESS TEST  02-26-2011   Normal lexiscan no exercise study/  no ischemia/  normal LV function and wall motion, ef 67%  . CARPAL TUNNEL RELEASE Right   . CATARACT EXTRACTION W/ INTRAOCULAR LENS  IMPLANT, BILATERAL Bilateral   .  CHOLECYSTECTOMY    . COLOSTOMY N/A 06/02/2014   Procedure: DIVERTING DESCENDING END COLOSTOMY;  Surgeon: Donnie Mesa, MD;  Location: [facility];  Service: General;  Laterality: N/A;  . CORONARY ANGIOPLASTY WITH STENT PLACEMENT  11-04-2007  dr bensimhon   BMS x2 to RCA/  mild Non-obstructive disease LAD/  normal LVF  . DILATION AND CURETTAGE OF UTERUS    . ESOPHAGOGASTRODUODENOSCOPY N/A 10/28/2017   Procedure: ESOPHAGOGASTRODUODENOSCOPY (EGD);  Surgeon: Yetta Flock, MD;  Location[facility]COPY;  Service: Gastroenterology;  Laterality: N/A;  . EVALUATION UNDER ANESTHESIA WITH ANAL FISTULECTOMY N/A 10/13/2014   Procedure: ANAL EXAM UNDER ANESTHESIA ;  Surgeon: [name], MD;  Location: [facility];  Service: General;  Laterality: N/A;  . HAND SURGERY     Tendon repair  . LAPAROSCOPIC SIGMOID COLECTOMY N/A 11/24/2014   Procedure: SIGMOID COLECTOMY AND COLSOTOMY CLOSURE;  Surgeon: Donnie Mesa, MD;  Location: [place];  Service: General;  Laterality: N/A;  . LUMBAR LAMINECTOMY  10-29-2002,  1969   Left  L3 -- 4  decompression  . PARTIAL COLECTOMY N/A 06/02/2014   Procedure: RIGHT PARTIAL COLECTOMY ;  Surgeon: Donnie Mesa, MD;  Location: [place];  Service: General;  Laterality: N/A;  . PATELLECTOMY Right 09/14/2013   Procedure: PATELLECTOMY;  Surgeon: Meredith Pel, MD;  Location: [place];  Service: Orthopedics;  Laterality: Right;  . PROCTOSCOPY N/A 10/13/2014   Procedure: RIDGE PROCTOSCOPY;  Surgeon: [name], MD;  Location: [facility];  Service: General;  Laterality: N/A;  . REVISION TOTAL KNEE ARTHROPLASTY Bilateral right  04-02-2011/  left 1996 & 10-05-1999  . ROTATOR CUFF REPAIR Bilateral   . TONSILLECTOMY    . TOTAL KNEE ARTHROPLASTY Bilateral  left 1994/  right 2000  . TOTAL KNEE REVISION Left 08/02/2015   Procedure: LEFT FEMORAL REVISION;  Surgeon: Gaynelle Arabian, MD;  Location: [facility];  Service: Orthopedics;  Laterality: Left;  . TRANSTHORACIC ECHOCARDIOGRAM  12-01-2009   Grade I diastolic dysfunction/  ef 60%/  moderate MR/  mild TR    Family History  Problem Relation Age of Onset  . Osteoarthritis Mother   . Heart failure Mother   . Colon cancer Maternal Uncle 38  . Heart Problems Father   . Cancer Sister 38       ? colon cancer   . Kidney cancer Brother 78  . Esophageal cancer Neg Hx   . Rectal cancer Neg Hx   . Stomach cancer Neg Hx   . Pancreatic cancer Neg Hx     No Known Allergies  Current Outpatient Medications on File Prior to Visit  Medication Sig Dispense Refill  . acetaminophen (TYLENOL) 325 MG tablet Take 650 mg by mouth every 6 (six) hours as needed for moderate pain.     Marland Kitchen albuterol (PROAIR HFA) 108 (90 Base) MCG/ACT inhaler Inhale 2 puffs into the lungs every 6 (six) hours as needed for wheezing or shortness of breath. 6.7 g 6  . albuterol (PROVENTIL HFA;VENTOLIN HFA) 108 (90 Base) MCG/ACT inhaler Inhale 2 puffs into the lungs every 6 (six) hours as needed for wheezing or shortness of breath. 1 Inhaler 3  . apixaban (ELIQUIS) 5 MG TABS tablet Take 1 tablet (5 mg total) by mouth 2 (two) times daily. 180 tablet 0  . Ascorbic Acid (VITAMIN C) 1000 MG tablet Take 1,000 mg by mouth daily.    . benazepril (LOTENSIN) 40 MG tablet Take 1 tablet (40 mg total) by mouth daily. 90 tablet 0  . diltiazem (  CARDIZEM CD) 120 MG 24 hr capsule Take 1 capsule by mouth as needed when Heart Rate is above 646 and Systolic Blood Pressure is above 100 mm/hg 90 capsule 0  . esomeprazole (NEXIUM) 40 MG capsule Take 1 capsule (40 mg total) by mouth daily. 90 capsule 0  . ferrous sulfate 325 (65 FE) MG EC tablet Take 325 mg by mouth daily with breakfast.    . fluticasone (FLOVENT HFA) 44 MCG/ACT inhaler Inhale 2 puffs into the lungs  2 (two) times daily. 1 Inhaler 0  . furosemide (LASIX) 20 MG tablet Take 2 tablets (40 mg total) by mouth daily. Please keep upcoming appt in November with Dr. Acie Fredrickson before anymore refills. Thank you 180 tablet 0  . gabapentin (NEURONTIN) 600 MG tablet Take 1 tablet (600 mg total) by mouth 2 (two) times daily. 180 tablet 0  . LORazepam (ATIVAN) 0.5 MG tablet 1 tablet twice daily as needed for anxiety or sleep 60 tablet 2  . metoprolol tartrate (LOPRESSOR) 50 MG tablet Take 1.5 tablets (75 mg total) by mouth 2 (two) times daily. 270 tablet 0  . nitroGLYCERIN (NITROSTAT) 0.4 MG SL tablet Place 1 tablet (0.4 mg total) under the tongue every 5 (five) minutes as needed for chest pain (x 3 pills). Reported on 09/01/2015 25 tablet 3  . sertraline (ZOLOFT) 50 MG tablet Take 1 tablet (50 mg total) by mouth daily. 90 tablet 0  . Spacer/Aero-Holding Chambers (E-Z SPACER) inhaler Use as instructed 1 each 2  . Tiotropium Bromide Monohydrate (SPIRIVA RESPIMAT) 2.5 MCG/ACT AERS Inhale 2 puffs into the lungs daily. 4 g 3  . Tiotropium Bromide Monohydrate (SPIRIVA RESPIMAT) 2.5 MCG/ACT AERS Inhale 2 puffs into the lungs daily. 4 g 0  . traZODone (DESYREL) 100 MG tablet TAKE 1/2 (ONE-HALF) TABLET BY MOUTH AT BEDTIME 45 tablet 1  . triamcinolone cream (KENALOG) 0.1 % Apply 1 application topically 2 (two) times daily as needed (Eczema on legs). 30 g 2   No current facility-administered medications on file prior to visit.     BP 136/80   Temp 97.6 F (36.4 C) (Temporal)   Wt 183 lb (83 kg)   BMI 31.91 kg/m       Objective:   Physical Exam Vitals signs and nursing note reviewed.  Constitutional:      Appearance: Normal appearance.  Eyes:     Extraocular Movements: Extraocular movements intact.     Pupils: Pupils are equal, round, and reactive to light.  Cardiovascular:     Rate and Rhythm: Normal rate and regular rhythm.     Pulses: Normal pulses.     Heart sounds: Normal heart sounds.  Pulmonary:      Effort: Pulmonary effort is normal.     Breath sounds: Normal breath sounds.  Musculoskeletal: Normal range of motion.        General: Tenderness present. No deformity.     Right lower leg: Edema present.     Left lower leg: Edema present.     Comments: Pain throughout right shoulder and left knee with palpation.  No cervical spine pain with palpation no step-offs felt.  She has mild tenderness in her right hand at her palm and wrist.  Skin:    General: Skin is warm and dry.     Findings: No bruising.  Neurological:     General: No focal deficit present.     Mental Status: She is alert and oriented to person, place, and time.  Sensory: No sensory deficit.     Gait: Gait abnormal.     Comments: -Walks with a slow steady gait with rolling walker.  - 4 out of 5 grip strength in the right hand.  Psychiatric:        Mood and Affect: Mood normal.        Behavior: Behavior normal.        Thought Content: Thought content normal.        Judgment: Judgment normal.        Assessment & Plan:  1. Acute pain of right shoulder -Significant loss of range of motion.  Concerned about rotator cuff tear.  Will get x-ray to look for fracture.  Can consider referring to sports medicine or doing steroid injection for pain relief - DG Shoulder Right; Future   2. Chronic pain of left knee  - DG Knee 3 Views Left; Future  3. Neck pain  - DG Cervical Spine Complete; Future  4. Right hand pain  - DG Hand Complete Right; Future  5. Dizziness -Possibly due to blood pressure medication.  Blood pressure is more elevated today than it has been in the past.  She does not appear to be suffering from orthostatic hypotension.  Consider decreasing Lotensin

## 2019-06-09 ENCOUNTER — Ambulatory Visit (INDEPENDENT_AMBULATORY_CARE_PROVIDER_SITE_OTHER): Payer: HMO | Admitting: Cardiovascular Disease

## 2019-06-09 ENCOUNTER — Encounter: Payer: Self-pay | Admitting: Adult Health

## 2019-06-09 ENCOUNTER — Encounter: Payer: Self-pay | Admitting: Cardiovascular Disease

## 2019-06-09 ENCOUNTER — Ambulatory Visit (INDEPENDENT_AMBULATORY_CARE_PROVIDER_SITE_OTHER): Payer: HMO | Admitting: Adult Health

## 2019-06-09 ENCOUNTER — Other Ambulatory Visit: Payer: Self-pay

## 2019-06-09 VITALS — BP 126/82 | HR 82 | Ht 63.5 in | Wt 186.8 lb

## 2019-06-09 VITALS — BP 126/82 | Temp 97.0°F | Wt 187.0 lb

## 2019-06-09 DIAGNOSIS — I5032 Chronic diastolic (congestive) heart failure: Secondary | ICD-10-CM | POA: Diagnosis not present

## 2019-06-09 DIAGNOSIS — M130 Polyarthritis, unspecified: Secondary | ICD-10-CM | POA: Diagnosis not present

## 2019-06-09 DIAGNOSIS — I4819 Other persistent atrial fibrillation: Secondary | ICD-10-CM | POA: Diagnosis not present

## 2019-06-09 DIAGNOSIS — I5033 Acute on chronic diastolic (congestive) heart failure: Secondary | ICD-10-CM | POA: Diagnosis not present

## 2019-06-09 DIAGNOSIS — M25511 Pain in right shoulder: Secondary | ICD-10-CM | POA: Diagnosis not present

## 2019-06-09 DIAGNOSIS — Z760 Encounter for issue of repeat prescription: Secondary | ICD-10-CM

## 2019-06-09 DIAGNOSIS — I482 Chronic atrial fibrillation, unspecified: Secondary | ICD-10-CM

## 2019-06-09 MED ORDER — DILTIAZEM HCL ER COATED BEADS 120 MG PO CP24: ORAL_CAPSULE | ORAL | 3 refills | Status: DC

## 2019-06-09 MED ORDER — METHYLPREDNISOLONE ACETATE 80 MG/ML IJ SUSP
80.0000 mg | Freq: Once | INTRAMUSCULAR | Status: AC
  Administered 2019-06-09: 80 mg via INTRA_ARTICULAR

## 2019-06-09 MED ORDER — FUROSEMIDE 20 MG PO TABS: 40.0000 mg | ORAL_TABLET | Freq: Every day | ORAL | 3 refills | Status: DC

## 2019-06-09 NOTE — Progress Notes (Signed)
Cardiology Office Note:    Date:  06/09/2019   ID:  [name], DOB 11-21-1929, MRN [id number]  PCP:  [name], NP  Cardiologist:  Mertie Moores, MD  Electrophysiologist:  None  Referring MD: [name], NP   Problem list 1.  Coronary artery disease-status post stenting of her right coronary artery in 2009 2.  Chronic diastolic congestive heart failure 3.  Mitral regurgitation 4.  Hypertension 5.  Hyperlipidemia 6.  Persistent atrial fibrillation 7.  COPD 8.  Pulmonary hypertension 9.  Hx of DVT 10.  History of colon cancer  Chief Complaint  Patient presents with   Coronary Artery Disease   Atrial Fibrillation        [name] is a 85 y.o. female with a hx of coronary artery disease.  She is status post stenting of her right coronary artery x2 in 2009.  She has a history of chronic diastolic congestive heart failure.  She was admitted to the hospital in April, 2019 for acute on chronic congestive heart failure.  She was found to be anemic and had heme positive stools.  Has had pneumonia  Has had a cough for the past several weeks  Is more short of breath  Had a CT scan this am   Nov. 25, 2020:  No cardiac complaints Has difficulty swallowing  Food gets stuck -  Advised her to try warm water to help wash down  Breathing is ok .   Gets some shortness of breath with walking  Tries to avoid salty foods.   Past Medical History:  Diagnosis Date   Adenocarcinoma, colon [facility]) Oncologist-- Dr Truitt Merle   Multifocal (2) Colon cancer @ ileocecal valve and ascending ( [id number]), Stage IIIB, grade I, MMR normal , 2 of 16 +lymph nodes, negative surgical margins---  06-02-2014  Right hemicolectomy w/ colostomy   Allergy    Anxiety    Asthma    inhaler "sometimes"   CAD (coronary artery disease)    a. LHC 4/09: pLAD 40, mDx 70-75, pLCx 30, mLCx 50, mRCA 90, EF 60% >> PCI: BMS x2 to RCA;  b. Myoview 8/12: normal   Cataract    bil cateracts removed    Chronic anemia    Chronic diastolic CHF (congestive heart failure) ([name])    a. Echo 912 - Mild LVH, EF 55-60%, no RWMA, Gr 1 DD, mild MR, mild LAE, mild RAE, PASP 59 mmHg (mod to severe pulmo HTN)   Clotting disorder (HCC)    hx of dvt, tia on eliquis   Colostomy in place [facility])    s/p colostomy takedown 5/16   Colovaginal fistula    s/p colostomy >> colostomy takedown 5/16   COPD with chronic bronchitis (HCC)    Depression    Dysrhythmia    A fib   Essential hypertension    GERD (gastroesophageal reflux disease)    History of adenomatous polyp of colon    History of blood transfusion    History of DVT of lower extremity    History of hiatal hernia    History of TIA (transient ischemic attack)    a. Head CT 6/15: small chronic lacunar infarct in thalamus   Hyperlipidemia    OA (osteoarthritis)    Osteoporosis    Peripheral neuropathy    Pneumonia    Pulmonary HTN ([place])    a. PASP on Echo in 9/12:  59 mmHg   Renal insufficiency    Sigmoid diverticulosis  s/p sigmoid colectomy   Stroke [facility])    TIAs   Wears dentures    Wears glasses     Past Surgical History:  Procedure Laterality Date   ABDOMINAL HYSTERECTOMY     ANTERIOR CERVICAL DECOMP/DISCECTOMY FUSION  01-31-2009   C5 -- 7   APPENDECTOMY     Bone spurs Bilateral    Feet   CARDIOVASCULAR STRESS TEST  02-26-2011   Normal lexiscan no exercise study/  no ischemia/  normal LV function and wall motion, ef 67%   CARPAL TUNNEL RELEASE Right    CATARACT EXTRACTION W/ INTRAOCULAR LENS  IMPLANT, BILATERAL Bilateral    CHOLECYSTECTOMY     COLOSTOMY N/A 06/02/2014   Procedure: DIVERTING DESCENDING END COLOSTOMY;  Surgeon: Donnie Mesa, MD;  Location: [place];  Service: General;  Laterality: N/A;   CORONARY ANGIOPLASTY WITH STENT PLACEMENT  11-04-2007  dr bensimhon   BMS x2 to RCA/  mild Non-obstructive disease LAD/  normal LVF   DILATION AND CURETTAGE OF UTERUS      ESOPHAGOGASTRODUODENOSCOPY N/A 10/28/2017   Procedure: ESOPHAGOGASTRODUODENOSCOPY (EGD);  Surgeon: Yetta Flock, MD;  Location: [facility] ENDOSCOPY;  Service: Gastroenterology;  Laterality: N/A;   EVALUATION UNDER ANESTHESIA WITH ANAL FISTULECTOMY N/A 10/13/2014   Procedure: ANAL EXAM UNDER ANESTHESIA ;  Surgeon: [name], MD;  Location: [facility];  Service: General;  Laterality: N/A;   HAND SURGERY     Tendon repair   LAPAROSCOPIC SIGMOID COLECTOMY N/A 11/24/2014   Procedure: SIGMOID COLECTOMY AND COLSOTOMY CLOSURE;  Surgeon: Donnie Mesa, MD;  Location: [place];  Service: General;  Laterality: N/A;   LUMBAR LAMINECTOMY  10-29-2002,  1969   Left  L3 -- 4  decompression   PARTIAL COLECTOMY N/A 06/02/2014   Procedure: RIGHT PARTIAL COLECTOMY ;  Surgeon: Donnie Mesa, MD;  Location: [place];  Service: General;  Laterality: N/A;   PATELLECTOMY Right 09/14/2013   Procedure: Wendi Maya;  Surgeon: Meredith Pel, MD;  Location: [place];  Service: Orthopedics;  Laterality: Right;   PROCTOSCOPY N/A 10/13/2014   Procedure: RIDGE PROCTOSCOPY;  Surgeon: [name], MD;  Location: [facility];  Service: General;  Laterality: N/A;   REVISION TOTAL KNEE ARTHROPLASTY Bilateral right  04-02-2011/  left 1996 & 10-05-1999   ROTATOR CUFF REPAIR Bilateral    TONSILLECTOMY     TOTAL KNEE ARTHROPLASTY Bilateral left 3846/  right 2000   TOTAL KNEE REVISION Left 08/02/2015   Procedure: LEFT FEMORAL REVISION;  Surgeon: Gaynelle Arabian, MD;  Location: [facility];  Service: Orthopedics;  Laterality: Left;   TRANSTHORACIC ECHOCARDIOGRAM  12-01-2009   Grade I diastolic dysfunction/  ef 60%/  moderate MR/  mild TR    Current Medications: Current Meds  Medication Sig   acetaminophen (TYLENOL) 325 MG tablet Take 650 mg by mouth every 6 (six) hours as needed for moderate pain.    albuterol (PROAIR HFA) 108 (90 Base) MCG/ACT inhaler Inhale 2 puffs into the lungs every 6 (six) hours as needed for wheezing or  shortness of breath.   apixaban (ELIQUIS) 5 MG TABS tablet Take 1 tablet (5 mg total) by mouth 2 (two) times daily.   Ascorbic Acid (VITAMIN C) 1000 MG tablet Take 1,000 mg by mouth daily.   benazepril (LOTENSIN) 40 MG tablet Take 1 tablet (40 mg total) by mouth daily.   diltiazem (CARDIZEM CD) 120 MG 24 hr capsule Take 1 capsule by mouth as needed when Heart Rate is above 659 and Systolic Blood Pressure is above 100  mm/hg   esomeprazole (NEXIUM) 40 MG capsule Take 1 capsule (40 mg total) by mouth daily.   ferrous sulfate 325 (65 FE) MG EC tablet Take 325 mg by mouth daily with breakfast.   fluticasone (FLOVENT HFA) 44 MCG/ACT inhaler Inhale 2 puffs into the lungs 2 (two) times daily.   furosemide (LASIX) 20 MG tablet Take 2 tablets (40 mg total) by mouth daily. Please keep upcoming appt in November with Dr. Acie Fredrickson before anymore refills. Thank you   gabapentin (NEURONTIN) 600 MG tablet Take 1 tablet (600 mg total) by mouth 2 (two) times daily.   LORazepam (ATIVAN) 0.5 MG tablet 1 tablet twice daily as needed for anxiety or sleep   metoprolol tartrate (LOPRESSOR) 50 MG tablet Take 1.5 tablets (75 mg total) by mouth 2 (two) times daily.   nitroGLYCERIN (NITROSTAT) 0.4 MG SL tablet Place 1 tablet (0.4 mg total) under the tongue every 5 (five) minutes as needed for chest pain (x 3 pills). Reported on 09/01/2015   sertraline (ZOLOFT) 50 MG tablet Take 1 tablet (50 mg total) by mouth daily.   Spacer/Aero-Holding Chambers (E-Z SPACER) inhaler Use as instructed   Tiotropium Bromide Monohydrate (SPIRIVA RESPIMAT) 2.5 MCG/ACT AERS Inhale 2 puffs into the lungs daily.   traZODone (DESYREL) 100 MG tablet TAKE 1/2 (ONE-HALF) TABLET BY MOUTH AT BEDTIME   triamcinolone cream (KENALOG) 0.1 % Apply 1 application topically 2 (two) times daily as needed (Eczema on legs).   [DISCONTINUED] diltiazem (CARDIZEM CD) 120 MG 24 hr capsule Take 1 capsule by mouth as needed when Heart Rate is above 492 and  Systolic Blood Pressure is above 100 mm/hg   [DISCONTINUED] furosemide (LASIX) 20 MG tablet Take 2 tablets (40 mg total) by mouth daily. Please keep upcoming appt in November with Dr. Acie Fredrickson before anymore refills. Thank you     Allergies:   Patient has no known allergies.   Social History   Socioeconomic History   Marital status: Widowed    Spouse name: Not on file   Number of children: Not on file   Years of education: Not on file   Highest education level: Not on file  Occupational History   Not on file  Social Needs   Financial resource strain: Not on file   Food insecurity    Worry: Never true    Inability: Never true   Transportation needs    Medical: No    Non-medical: No  Tobacco Use   Smoking status: Former Smoker    Packs/day: 0.50    Years: 16.00    Pack years: 8.00    Types: Cigarettes    Start date: 57    Quit date: 07/16/1967    Years since quitting: 51.9   Smokeless tobacco: Never Used  Substance and Sexual Activity   Alcohol use: No    Alcohol/week: 0.0 standard drinks   Drug use: No   Sexual activity: Not on file  Lifestyle   Physical activity    Days per week: Not on file    Minutes per session: Not on file   Stress: Not on file  Relationships   Social connections    Talks on phone: Not on file    Gets together: Not on file    Attends religious service: Not on file    Active member of club or organization: Not on file    Attends meetings of clubs or organizations: Not on file    Relationship status: Not on file  Other Topics  Concern   Not on file  Social History Narrative   Married, 2 children.  As of November 2015 her husband has been living in a nursing home for tendon after years as he suffers from multiple medical problems.   Patient does not drink alcohol nor does she smoke or chew tobacco products   Right handed   8th grade   1 cup daily     Family History: The patient's family history includes Cancer (age of  onset: 66) in her sister; Colon cancer (age of onset: 66) in her maternal uncle; Heart Problems in her father; Heart failure in her mother; Kidney cancer (age of onset: 48) in her brother; Osteoarthritis in her mother. There is no history of Esophageal cancer, Rectal cancer, Stomach cancer, or Pancreatic cancer.  ROS:   Please see the history of present illness.     All other systems reviewed and are negative.  EKGs/Labs/Other Studies Reviewed:    The following studies were reviewed today:    Recent Labs: 07/20/2018: ALT 18; Hemoglobin 13.3; Platelet Count 131 12/10/2018: BUN 39; Creatinine, Ser 1.51; Potassium 4.9; Sodium 141  Recent Lipid Panel    Component Value Date/Time   CHOL 130 01/19/2013 1014   TRIG 87.0 01/19/2013 1014   HDL 33.60 (L) 01/19/2013 1014   CHOLHDL 4 01/19/2013 1014   VLDL 17.4 01/19/2013 1014   LDLCALC 79 01/19/2013 1014    Physical Exam:    Physical Exam: Blood pressure 126/82, pulse 82, height 5' 3.5" (1.613 m), weight 186 lb 12.8 oz (84.7 kg), SpO2 99 %.  GEN:   Elderly , chronically ill female.  HEENT: Normal NECK: No JVD; No carotid bruits LYMPHATICS: No lymphadenopathy CARDIAC:  irreg irreg  RESPIRATORY:  Clear to auscultation without rales, wheezing or rhonchi  ABDOMEN: Soft, non-tender, non-distended MUSCULOSKELETAL:  No edema; No deformity  SKIN: Warm and dry NEUROLOGIC:  Alert and oriented x 3   EKG:   June 09, 2019: Atrial fibrillation with a heart rate of 82.  Occasional premature ventricular contraction or aberrantly conducted beat.  Possible anteroseptal myocardial infarction.  ASSESSMENT:    1. Persistent atrial fibrillation ([place])   2. Medication refill    PLAN:    In order of problems listed above:  1. Dyspnea:  Chronic problem  .   Cont diltiazem and lasix (she was not taking diltiazem regularly )  No new recommendations   2.  Coronary artery disease: no angina .  Seems to be stable from a cAD standpoint    3.   Chronic diastolic congestive heart failure: volume status seems to be well controlled.   4.  Chronic atrial fibrillation: cont. eliquis   Medication Adjustments/Labs and Tests Ordered: Current medicines are reviewed at length with the patient today.  Concerns regarding medicines are outlined above.  Orders Placed This Encounter  Procedures   EKG 12-Lead   Meds ordered this encounter  Medications   furosemide (LASIX) 20 MG tablet    Sig: Take 2 tablets (40 mg total) by mouth daily. Please keep upcoming appt in November with Dr. Acie Fredrickson before anymore refills. Thank you    Dispense:  180 tablet    Refill:  3   diltiazem (CARDIZEM CD) 120 MG 24 hr capsule    Sig: Take 1 capsule by mouth as needed when Heart Rate is above 427 and Systolic Blood Pressure is above 100 mm/hg    Dispense:  90 capsule    Refill:  3    Patient  Instructions  Medication Instructions:  Your physician recommends that you continue on your current medications as directed. Please refer to the Current Medication list given to you today. *If you need a refill on your cardiac medications before your next appointment, please call your pharmacy*  Lab Work: None ordered.   Testing/Procedures: None ordered.   Follow-Up: At [facility], you and your health needs are our priority.  As part of our continuing mission to provide you with exceptional heart care, we have created designated Provider Care Teams.  These Care Teams include your primary Cardiologist (physician) and Advanced Practice Providers (APPs -  Physician Assistants and Nurse Practitioners) who all work together to provide you with the care you need, when you need it.  Your next appointment:   6 month(s)  The format for your next appointment:   In Person  Provider:   Richardson Dopp, PA-C, Robbie Lis, PA-C or Daune Perch, NP    Signed, Mertie Moores, MD  06/09/2019 1:17 PM    [place]

## 2019-06-09 NOTE — Progress Notes (Signed)
Subjective:    Patient ID: [name], female    DOB: 09-11-1929, 85 y.o.   MRN: [id number]  HPI  85 year old female who  has a past medical history of Adenocarcinoma, colon ([place]) (Oncologist-- Dr Truitt Merle), Allergy, Anxiety, Asthma, CAD (coronary artery disease), Cataract, Chronic anemia, Chronic diastolic CHF (congestive heart failure) ([place]), Clotting disorder ([place]), Colostomy in place [facility]), Colovaginal fistula, COPD with chronic bronchitis ([place]), Depression, Dysrhythmia, Essential hypertension, GERD (gastroesophageal reflux disease), History of adenomatous polyp of colon, History of blood transfusion, History of DVT of lower extremity, History of hiatal hernia, History of TIA (transient ischemic attack), Hyperlipidemia, OA (osteoarthritis), Osteoporosis, Peripheral neuropathy, Pneumonia, Pulmonary HTN ([place]), Renal insufficiency, Sigmoid diverticulosis, Stroke ([place]), Wears dentures, and Wears glasses.  She returns today steroid injection to the right shoulder.  Today she presented with multiple painful sites after falling from home approximately 4 weeks ago.  Although her pain is proving throughout x-rays of the right shoulder, left knee, cervical spine, and right hand showed no acute fractures, dislocations but did show chronic degenerative changes.  He had loss of range of motion of the right shoulder with continued pain.  There was concern for rotator cuff injury she does have prior history of right shoulder surgery.  She is no longer able to reach things in the cabinet due to the loss of range of motion but has no loss of grip strength or numbness and tingling down the right arm.  Review of Systems See HPI   Past Medical History:  Diagnosis Date  . Adenocarcinoma, colon [facility]) Oncologist-- Dr Truitt Merle   Multifocal (2) Colon cancer @ ileocecal valve and ascending ( [id number]), Stage IIIB, grade I, MMR normal , 2 of 16 +lymph nodes, negative surgical margins---  06-02-2014  Right hemicolectomy  w/ colostomy  . Allergy   . Anxiety   . Asthma    inhaler "sometimes"  . CAD (coronary artery disease)    a. LHC 4/09: pLAD 40, mDx 70-75, pLCx 30, mLCx 50, mRCA 90, EF 60% >> PCI: BMS x2 to RCA;  b. Myoview 8/12: normal  . Cataract    bil cateracts removed  . Chronic anemia   . Chronic diastolic CHF (congestive heart failure) (HCC)    a. Echo 912 - Mild LVH, EF 55-60%, no RWMA, Gr 1 DD, mild MR, mild LAE, mild RAE, PASP 59 mmHg (mod to severe pulmo HTN)  . Clotting disorder (HCC)    hx of dvt, tia on eliquis  . Colostomy in place [facility])    s/p colostomy takedown 5/16  . Colovaginal fistula    s/p colostomy >> colostomy takedown 5/16  . COPD with chronic bronchitis ([place])   . Depression   . Dysrhythmia    A fib  . Essential hypertension   . GERD (gastroesophageal reflux disease)   . History of adenomatous polyp of colon   . History of blood transfusion   . History of DVT of lower extremity   . History of hiatal hernia   . History of TIA (transient ischemic attack)    a. Head CT 6/15: small chronic lacunar infarct in thalamus  . Hyperlipidemia   . OA (osteoarthritis)   . Osteoporosis   . Peripheral neuropathy   . Pneumonia   . Pulmonary HTN ([place])    a. PASP on Echo in 9/12:  59 mmHg  . Renal insufficiency   . Sigmoid diverticulosis    s/p sigmoid colectomy  . Stroke [facility])  TIAs  . Wears dentures   . Wears glasses     Social History   Socioeconomic History  . Marital status: Widowed    Spouse name: Not on file  . Number of children: Not on file  . Years of education: Not on file  . Highest education level: Not on file  Occupational History  . Not on file  Social Needs  . Financial resource strain: Not on file  . Food insecurity    Worry: Never true    Inability: Never true  . Transportation needs    Medical: No    Non-medical: No  Tobacco Use  . Smoking status: Former Smoker    Packs/day: 0.50    Years: 16.00    Pack years: 8.00    Types: Cigarettes     Start date: 25    Quit date: 07/16/1967    Years since quitting: 51.9  . Smokeless tobacco: Never Used  Substance and Sexual Activity  . Alcohol use: No    Alcohol/week: 0.0 standard drinks  . Drug use: No  . Sexual activity: Not on file  Lifestyle  . Physical activity    Days per week: Not on file    Minutes per session: Not on file  . Stress: Not on file  Relationships  . Social Herbalist on phone: Not on file    Gets together: Not on file    Attends religious service: Not on file    Active member of club or organization: Not on file    Attends meetings of clubs or organizations: Not on file    Relationship status: Not on file  . Intimate partner violence    Fear of current or ex partner: Not on file    Emotionally abused: Not on file    Physically abused: Not on file    Forced sexual activity: Not on file  Other Topics Concern  . Not on file  Social History Narrative   Married, 2 children.  As of November 2015 her husband has been living in a nursing home for tendon after years as he suffers from multiple medical problems.   Patient does not drink alcohol nor does she smoke or chew tobacco products   Right handed   8th grade   1 cup daily    Past Surgical History:  Procedure Laterality Date  . ABDOMINAL HYSTERECTOMY    . ANTERIOR CERVICAL DECOMP/DISCECTOMY FUSION  01-31-2009   C5 -- 7  . APPENDECTOMY    . Bone spurs Bilateral    Feet  . CARDIOVASCULAR STRESS TEST  02-26-2011   Normal lexiscan no exercise study/  no ischemia/  normal LV function and wall motion, ef 67%  . CARPAL TUNNEL RELEASE Right   . CATARACT EXTRACTION W/ INTRAOCULAR LENS  IMPLANT, BILATERAL Bilateral   . CHOLECYSTECTOMY    . COLOSTOMY N/A 06/02/2014   Procedure: DIVERTING DESCENDING END COLOSTOMY;  Surgeon: Donnie Mesa, MD;  Location: [place];  Service: General;  Laterality: N/A;  . CORONARY ANGIOPLASTY WITH STENT PLACEMENT  11-04-2007  dr bensimhon   BMS x2 to RCA/  mild  Non-obstructive disease LAD/  normal LVF  . DILATION AND CURETTAGE OF UTERUS    . ESOPHAGOGASTRODUODENOSCOPY N/A 10/28/2017   Procedure: ESOPHAGOGASTRODUODENOSCOPY (EGD);  Surgeon: Yetta Flock, MD;  Location: [facility] ENDOSCOPY;  Service: Gastroenterology;  Laterality: N/A;  . EVALUATION UNDER ANESTHESIA WITH ANAL FISTULECTOMY N/A 10/13/2014   Procedure: ANAL EXAM UNDER ANESTHESIA ;  Surgeon:  [name], MD;  Location: [facility];  Service: General;  Laterality: N/A;  . HAND SURGERY     Tendon repair  . LAPAROSCOPIC SIGMOID COLECTOMY N/A 11/24/2014   Procedure: SIGMOID COLECTOMY AND COLSOTOMY CLOSURE;  Surgeon: Donnie Mesa, MD;  Location: [place];  Service: General;  Laterality: N/A;  . LUMBAR LAMINECTOMY  10-29-2002,  1969   Left  L3 -- 4  decompression  . PARTIAL COLECTOMY N/A 06/02/2014   Procedure: RIGHT PARTIAL COLECTOMY ;  Surgeon: Donnie Mesa, MD;  Location: [place];  Service: General;  Laterality: N/A;  . PATELLECTOMY Right 09/14/2013   Procedure: PATELLECTOMY;  Surgeon: Meredith Pel, MD;  Location: [place];  Service: Orthopedics;  Laterality: Right;  . PROCTOSCOPY N/A 10/13/2014   Procedure: RIDGE PROCTOSCOPY;  Surgeon: [name], MD;  Location: [facility];  Service: General;  Laterality: N/A;  . REVISION TOTAL KNEE ARTHROPLASTY Bilateral right  04-02-2011/  left 1996 & 10-05-1999  . ROTATOR CUFF REPAIR Bilateral   . TONSILLECTOMY    . TOTAL KNEE ARTHROPLASTY Bilateral left 1994/  right 2000  . TOTAL KNEE REVISION Left 08/02/2015   Procedure: LEFT FEMORAL REVISION;  Surgeon: Gaynelle Arabian, MD;  Location: [facility];  Service: Orthopedics;  Laterality: Left;  . TRANSTHORACIC ECHOCARDIOGRAM  12-01-2009   Grade I diastolic dysfunction/  ef 60%/  moderate MR/  mild TR    Family History  Problem Relation Age of Onset  . Osteoarthritis Mother   . Heart failure Mother   . Colon cancer Maternal Uncle 42  . Heart Problems Father   . Cancer Sister 37       ? colon cancer   . Kidney  cancer Brother 27  . Esophageal cancer Neg Hx   . Rectal cancer Neg Hx   . Stomach cancer Neg Hx   . Pancreatic cancer Neg Hx     No Known Allergies  Current Outpatient Medications on File Prior to Visit  Medication Sig Dispense Refill  . acetaminophen (TYLENOL) 325 MG tablet Take 650 mg by mouth every 6 (six) hours as needed for moderate pain.     Marland Kitchen albuterol (PROAIR HFA) 108 (90 Base) MCG/ACT inhaler Inhale 2 puffs into the lungs every 6 (six) hours as needed for wheezing or shortness of breath. 6.7 g 6  . apixaban (ELIQUIS) 5 MG TABS tablet Take 1 tablet (5 mg total) by mouth 2 (two) times daily. 180 tablet 0  . Ascorbic Acid (VITAMIN C) 1000 MG tablet Take 1,000 mg by mouth daily.    . benazepril (LOTENSIN) 40 MG tablet Take 1 tablet (40 mg total) by mouth daily. 90 tablet 0  . diltiazem (CARDIZEM CD) 120 MG 24 hr capsule Take 1 capsule by mouth as needed when Heart Rate is above 408 and Systolic Blood Pressure is above 100 mm/hg 90 capsule 3  . esomeprazole (NEXIUM) 40 MG capsule Take 1 capsule (40 mg total) by mouth daily. 90 capsule 0  . ferrous sulfate 325 (65 FE) MG EC tablet Take 325 mg by mouth daily with breakfast.    . fluticasone (FLOVENT HFA) 44 MCG/ACT inhaler Inhale 2 puffs into the lungs 2 (two) times daily. 1 Inhaler 0  . furosemide (LASIX) 20 MG tablet Take 2 tablets (40 mg total) by mouth daily. Please keep upcoming appt in November with Dr. Acie Fredrickson before anymore refills. Thank you 180 tablet 3  . gabapentin (NEURONTIN) 600 MG tablet Take 1 tablet (600 mg total) by mouth 2 (two) times daily.  180 tablet 0  . LORazepam (ATIVAN) 0.5 MG tablet 1 tablet twice daily as needed for anxiety or sleep 60 tablet 2  . metoprolol tartrate (LOPRESSOR) 50 MG tablet Take 1.5 tablets (75 mg total) by mouth 2 (two) times daily. 270 tablet 0  . nitroGLYCERIN (NITROSTAT) 0.4 MG SL tablet Place 1 tablet (0.4 mg total) under the tongue every 5 (five) minutes as needed for chest pain (x 3  pills). Reported on 09/01/2015 25 tablet 3  . sertraline (ZOLOFT) 50 MG tablet Take 1 tablet (50 mg total) by mouth daily. 90 tablet 0  . Spacer/Aero-Holding Chambers (E-Z SPACER) inhaler Use as instructed 1 each 2  . Tiotropium Bromide Monohydrate (SPIRIVA RESPIMAT) 2.5 MCG/ACT AERS Inhale 2 puffs into the lungs daily. 4 g 3  . traZODone (DESYREL) 100 MG tablet TAKE 1/2 (ONE-HALF) TABLET BY MOUTH AT BEDTIME 45 tablet 1  . triamcinolone cream (KENALOG) 0.1 % Apply 1 application topically 2 (two) times daily as needed (Eczema on legs). 30 g 2   No current facility-administered medications on file prior to visit.     BP 126/82   Temp (!) 97 F (36.1 C) (Temporal)   Wt 187 lb (84.8 kg)   BMI 32.61 kg/m       Objective:   Physical Exam Vitals signs and nursing note reviewed.  Constitutional:      Appearance: Normal appearance.  Musculoskeletal: Normal range of motion.        General: Tenderness (right shouder. ) present. No deformity.     Comments: Loss of ROM to right shoulder.  Skin:    Capillary Refill: Capillary refill takes less than 2 seconds.  Neurological:     General: No focal deficit present.     Mental Status: She is alert and oriented to person, place, and time.  Psychiatric:        Mood and Affect: Mood normal.        Behavior: Behavior normal.        Thought Content: Thought content normal.        Judgment: Judgment normal.        Assessment & Plan:  1. Right shoulder pain, unspecified chronicity Discussed risks and benefits of corticosteroid injection and patient consented.  After prepping skin with betadine, injected 80 mg depomedrol and 2 cc of plain xylocaine with 22 gauge one and one half inch needle using posterior  approach and pt tolerated well.  - Advised follow up if no improvement. Will refer to orthopedics or sports medicine if needed  - methylPREDNISolone acetate (DEPO-MEDROL) injection 80 mg  2. Polyarthritis  - Ambulatory referral to  Rheumatology   [name], NP

## 2019-06-09 NOTE — Patient Instructions (Signed)
Medication Instructions:  Your physician recommends that you continue on your current medications as directed. Please refer to the Current Medication list given to you today. *If you need a refill on your cardiac medications before your next appointment, please call your pharmacy*  Lab Work: None ordered.   Testing/Procedures: None ordered.   Follow-Up: At [facility], you and your health needs are our priority.  As part of our continuing mission to provide you with exceptional heart care, we have created designated Provider Care Teams.  These Care Teams include your primary Cardiologist (physician) and Advanced Practice Providers (APPs -  Physician Assistants and Nurse Practitioners) who all work together to provide you with the care you need, when you need it.  Your next appointment:   6 month(s)  The format for your next appointment:   In Person  Provider:   Richardson Dopp, PA-C, Robbie Lis, PA-C or Daune Perch, NP

## 2019-06-17 DIAGNOSIS — M7989 Other specified soft tissue disorders: Secondary | ICD-10-CM | POA: Diagnosis not present

## 2019-06-17 DIAGNOSIS — M064 Inflammatory polyarthropathy: Secondary | ICD-10-CM | POA: Diagnosis not present

## 2019-06-17 DIAGNOSIS — M19041 Primary osteoarthritis, right hand: Secondary | ICD-10-CM | POA: Diagnosis not present

## 2019-06-17 DIAGNOSIS — M791 Myalgia, unspecified site: Secondary | ICD-10-CM | POA: Diagnosis not present

## 2019-06-17 DIAGNOSIS — M79671 Pain in right foot: Secondary | ICD-10-CM | POA: Diagnosis not present

## 2019-06-17 DIAGNOSIS — M79643 Pain in unspecified hand: Secondary | ICD-10-CM | POA: Diagnosis not present

## 2019-06-17 DIAGNOSIS — M79672 Pain in left foot: Secondary | ICD-10-CM | POA: Diagnosis not present

## 2019-06-17 DIAGNOSIS — M79641 Pain in right hand: Secondary | ICD-10-CM | POA: Diagnosis not present

## 2019-06-17 DIAGNOSIS — M79642 Pain in left hand: Secondary | ICD-10-CM | POA: Diagnosis not present

## 2019-06-17 DIAGNOSIS — I5032 Chronic diastolic (congestive) heart failure: Secondary | ICD-10-CM | POA: Diagnosis not present

## 2019-06-17 DIAGNOSIS — M19042 Primary osteoarthritis, left hand: Secondary | ICD-10-CM | POA: Diagnosis not present

## 2019-06-17 DIAGNOSIS — M159 Polyosteoarthritis, unspecified: Secondary | ICD-10-CM | POA: Diagnosis not present

## 2019-06-17 DIAGNOSIS — M109 Gout, unspecified: Secondary | ICD-10-CM | POA: Diagnosis not present

## 2019-06-17 DIAGNOSIS — M2550 Pain in unspecified joint: Secondary | ICD-10-CM | POA: Diagnosis not present

## 2019-06-18 ENCOUNTER — Telehealth: Payer: Self-pay | Admitting: Adult Health

## 2019-06-18 NOTE — Telephone Encounter (Signed)
See note

## 2019-06-18 NOTE — Telephone Encounter (Signed)
Pt's daughter calling.  States that whatever was supposed to be called in for pt after last OV still has not been called in.  Pt believes that two medications were supposed to be done for her.

## 2019-06-18 NOTE — Telephone Encounter (Signed)
Called and spoke to [place].  Informed her that the triamcinolone cream (KENALOG) 0.1 % was the only medication called in.  She just wanted to make sure the pharmacy gave her everything that [facility] sent in .  Nothing further needed.

## 2019-07-10 ENCOUNTER — Emergency Department (HOSPITAL_COMMUNITY): Payer: HMO

## 2019-07-10 ENCOUNTER — Other Ambulatory Visit: Payer: Self-pay

## 2019-07-10 ENCOUNTER — Emergency Department (HOSPITAL_COMMUNITY)
Admission: EM | Admit: 2019-07-10 | Discharge: 2019-07-10 | Disposition: A | Discharge status: Home / Self Care | Payer: HMO | Attending: Emergency Medicine | Admitting: Emergency Medicine

## 2019-07-10 ENCOUNTER — Encounter (HOSPITAL_COMMUNITY): Payer: Self-pay

## 2019-07-10 ENCOUNTER — Telehealth: Payer: HMO

## 2019-07-10 DIAGNOSIS — K625 Hemorrhage of anus and rectum: Secondary | ICD-10-CM | POA: Insufficient documentation

## 2019-07-10 DIAGNOSIS — Z87891 Personal history of nicotine dependence: Secondary | ICD-10-CM | POA: Insufficient documentation

## 2019-07-10 DIAGNOSIS — Z79899 Other long term (current) drug therapy: Secondary | ICD-10-CM | POA: Diagnosis not present

## 2019-07-10 DIAGNOSIS — I110 Hypertensive heart disease with heart failure: Secondary | ICD-10-CM | POA: Diagnosis not present

## 2019-07-10 DIAGNOSIS — Z20828 Contact with and (suspected) exposure to other viral communicable diseases: Secondary | ICD-10-CM | POA: Diagnosis not present

## 2019-07-10 DIAGNOSIS — Z7901 Long term (current) use of anticoagulants: Secondary | ICD-10-CM | POA: Diagnosis not present

## 2019-07-10 DIAGNOSIS — J449 Chronic obstructive pulmonary disease, unspecified: Secondary | ICD-10-CM | POA: Insufficient documentation

## 2019-07-10 DIAGNOSIS — I5032 Chronic diastolic (congestive) heart failure: Secondary | ICD-10-CM | POA: Insufficient documentation

## 2019-07-10 DIAGNOSIS — I2510 Atherosclerotic heart disease of native coronary artery without angina pectoris: Secondary | ICD-10-CM | POA: Insufficient documentation

## 2019-07-10 DIAGNOSIS — R079 Chest pain, unspecified: Secondary | ICD-10-CM

## 2019-07-10 DIAGNOSIS — J45909 Unspecified asthma, uncomplicated: Secondary | ICD-10-CM | POA: Insufficient documentation

## 2019-07-10 LAB — COMPREHENSIVE METABOLIC PANEL
ALT: 28 U/L (ref 0–44)
AST: 54 U/L — ABNORMAL HIGH (ref 15–41)
Albumin: 4.2 g/dL (ref 3.5–5.0)
Alkaline Phosphatase: 48 U/L (ref 38–126)
Anion gap: 12 (ref 5–15)
BUN: 49 mg/dL — ABNORMAL HIGH (ref 8–23)
CO2: 23 mmol/L (ref 22–32)
Calcium: 9.4 mg/dL (ref 8.9–10.3)
Chloride: 106 mmol/L (ref 98–111)
Creatinine, Ser: 1.3 mg/dL — ABNORMAL HIGH (ref 0.44–1.00)
GFR calc Af Amer: 43 mL/min — ABNORMAL LOW (ref >60)
GFR calc non Af Amer: 37 mL/min — ABNORMAL LOW (ref >60)
Glucose, Bld: 99 mg/dL (ref 70–99)
Potassium: 4.7 mmol/L (ref 3.5–5.1)
Sodium: 141 mmol/L (ref 135–145)
Total Bilirubin: 1.2 mg/dL (ref 0.3–1.2)
Total Protein: 8.1 g/dL (ref 6.5–8.1)

## 2019-07-10 LAB — CBC
HCT: 41.2 % (ref 36.0–46.0)
Hemoglobin: 12.9 g/dL (ref 12.0–15.0)
MCH: 30.8 pg (ref 26.0–34.0)
MCHC: 31.3 g/dL (ref 30.0–36.0)
MCV: 98.3 fL (ref 80.0–100.0)
Platelets: 110 10*3/uL — ABNORMAL LOW (ref 150–400)
RBC: 4.19 MIL/uL (ref 3.87–5.11)
RDW: 14.1 % (ref 11.5–15.5)
WBC: 6.3 10*3/uL (ref 4.0–10.5)
nRBC: 0 % (ref 0.0–0.2)

## 2019-07-10 LAB — LIPASE, BLOOD: Lipase: 45 U/L (ref 11–51)

## 2019-07-10 LAB — TYPE AND SCREEN
ABO/RH(D): A POS
Antibody Screen: NEGATIVE

## 2019-07-10 LAB — PROTIME-INR
INR: 1.4 — ABNORMAL HIGH (ref 0.8–1.2)
Prothrombin Time: 17 seconds — ABNORMAL HIGH (ref 11.4–15.2)

## 2019-07-10 LAB — POC OCCULT BLOOD, ED: Fecal Occult Bld: POSITIVE — AB

## 2019-07-10 LAB — TROPONIN I (HIGH SENSITIVITY)
Troponin I (High Sensitivity): 4 ng/L (ref <18)
Troponin I (High Sensitivity): 4 ng/L (ref <18)

## 2019-07-10 MED ORDER — SODIUM CHLORIDE 0.9% FLUSH: 3.0000 mL | Freq: Once | INTRAVENOUS | Status: DC

## 2019-07-10 NOTE — ED Provider Notes (Signed)
[facility] DEPT Provider Note   CSN: [id number] Arrival date & time: 07/10/19  1144     History Chief Complaint  Patient presents with  . Chest Pain  . Rectal Bleeding    [name] is a 85 y.o. female.  HPI   Pt states she started having rectal bleeding several days ago.  It is bright red blood.  It has been increasing in severity.  She has had some vomiting but no blood noted in the emesis.  She has been feeling lightheaded and weak.  SHe has some pain in her lower abdomen and lower back.   She also complains of some mild intermittent chest pain on the left side radiating to her arm.  It comes and goes, sharp, lasting maybe an hour  "It bugs her".  Pt states she often has chest pain which she attributes to her CHF history but in the last day or so the pain is worse.   Past Medical History:  Diagnosis Date  . Adenocarcinoma, colon [facility]) Oncologist-- Dr Truitt Merle   Multifocal (2) Colon cancer @ ileocecal valve and ascending ( [id number]), Stage IIIB, grade I, MMR normal , 2 of 16 +lymph nodes, negative surgical margins---  06-02-2014  Right hemicolectomy w/ colostomy  . Allergy   . Anxiety   . Asthma    inhaler "sometimes"  . CAD (coronary artery disease)    a. LHC 4/09: pLAD 40, mDx 70-75, pLCx 30, mLCx 50, mRCA 90, EF 60% >> PCI: BMS x2 to RCA;  b. Myoview 8/12: normal  . Cataract    bil cateracts removed  . Chronic anemia   . Chronic diastolic CHF (congestive heart failure) (HCC)    a. Echo 912 - Mild LVH, EF 55-60%, no RWMA, Gr 1 DD, mild MR, mild LAE, mild RAE, PASP 59 mmHg (mod to severe pulmo HTN)  . Clotting disorder (HCC)    hx of dvt, tia on eliquis  . Colostomy in place [facility])    s/p colostomy takedown 5/16  . Colovaginal fistula    s/p colostomy >> colostomy takedown 5/16  . COPD with chronic bronchitis ([place])   . Depression   . Dysrhythmia    A fib  . Essential hypertension   . GERD (gastroesophageal reflux disease)   . History  of adenomatous polyp of colon   . History of blood transfusion   . History of DVT of lower extremity   . History of hiatal hernia   . History of TIA (transient ischemic attack)    a. Head CT 6/15: small chronic lacunar infarct in thalamus  . Hyperlipidemia   . OA (osteoarthritis)   . Osteoporosis   . Peripheral neuropathy   . Pneumonia   . Pulmonary HTN ([place])    a. PASP on Echo in 9/12:  59 mmHg  . Renal insufficiency   . Sigmoid diverticulosis    s/p sigmoid colectomy  . Stroke ([place])    TIAs  . Wears dentures   . Wears glasses     Patient Active Problem List   Diagnosis Date Noted  . Centrilobular emphysema ([place]) 04/06/2019  . Gastric ulcer   . Upper GI bleed   . Thrombocytopenia ([place]) 10/27/2017  . Abnormal stress test 10/27/2017  . Moderate mitral regurgitation 10/27/2017  . Acute on chronic diastolic CHF (congestive heart failure) ([place]) 10/27/2017  . CAP (community acquired pneumonia) 10/26/2017  . Displaced fracture of lateral malleolus of right fibula, subsequent encounter for closed  fracture with routine healing 12/27/2016  . Abdominal pain, epigastric 11/12/2016  . Nausea and vomiting 11/12/2016  . Iron deficiency anemia 11/12/2016  . Chronic anticoagulation 11/12/2016  . Chronic atrial fibrillation ([place]) 08/05/2016  . Coronary artery disease of native artery of native heart with stable angina pectoris ([place]) 08/05/2016  . Failed total left knee replacement ([place]) 08/02/2015  . Sigmoid diverticulitis 11/24/2014  . Acute blood loss anemia 06/13/2014  . Neuropathic pain 06/13/2014  . Insomnia 06/13/2014  . Anxiety and depression 06/13/2014  . Cancer of ascending colon ([place]) 06/13/2014  . Small bowel obstruction ([place]) 05/31/2014  . Colovaginal fistula 04/20/2014  . Lethargy 09/15/2013  . Chest pain, musculoskeletal 02/19/2011  . Depression 02/07/2010  . DIASTOLIC HEART FAILURE, CHRONIC 02/27/2009  . PURE HYPERCHOLESTEROLEMIA 04/05/2008  . Coronary  atherosclerosis 04/05/2008  . Osteoarthritis 05/19/2007  . Essential hypertension 01/21/2007  . GERD 01/21/2007  . Hiatal hernia 01/21/2007  . Diverticulosis of large intestine 01/21/2007  . COLONIC POLYPS, HX OF 01/21/2007    Past Surgical History:  Procedure Laterality Date  . ABDOMINAL HYSTERECTOMY    . ANTERIOR CERVICAL DECOMP/DISCECTOMY FUSION  01-31-2009   C5 -- 7  . APPENDECTOMY    . Bone spurs Bilateral    Feet  . CARDIOVASCULAR STRESS TEST  02-26-2011   Normal lexiscan no exercise study/  no ischemia/  normal LV function and wall motion, ef 67%  . CARPAL TUNNEL RELEASE Right   . CATARACT EXTRACTION W/ INTRAOCULAR LENS  IMPLANT, BILATERAL Bilateral   . CHOLECYSTECTOMY    . COLOSTOMY N/A 06/02/2014   Procedure: DIVERTING DESCENDING END COLOSTOMY;  Surgeon: Donnie Mesa, MD;  Location: [place];  Service: General;  Laterality: N/A;  . CORONARY ANGIOPLASTY WITH STENT PLACEMENT  11-04-2007  dr bensimhon   BMS x2 to RCA/  mild Non-obstructive disease LAD/  normal LVF  . DILATION AND CURETTAGE OF UTERUS    . ESOPHAGOGASTRODUODENOSCOPY N/A 10/28/2017   Procedure: ESOPHAGOGASTRODUODENOSCOPY (EGD);  Surgeon: Yetta Flock, MD;  Location[facility] ENDOSCOPY;  Service: Gastroenterology;  Laterality: N/A;  . EVALUATION UNDER ANESTHESIA WITH ANAL FISTULECTOMY N/A 10/13/2014   Procedure: ANAL EXAM UNDER ANESTHESIA ;  Surgeon: [name], MD;  Location: [facility];  Service: General;  Laterality: N/A;  . HAND SURGERY     Tendon repair  . LAPAROSCOPIC SIGMOID COLECTOMY N/A 11/24/2014   Procedure: SIGMOID COLECTOMY AND COLSOTOMY CLOSURE;  Surgeon: Donnie Mesa, MD;  Location: [place];  Service: General;  Laterality: N/A;  . LUMBAR LAMINECTOMY  10-29-2002,  1969   Left  L3 -- 4  decompression  . PARTIAL COLECTOMY N/A 06/02/2014   Procedure: RIGHT PARTIAL COLECTOMY ;  Surgeon: Donnie Mesa, MD;  Location: [place];  Service: General;  Laterality: N/A;  . PATELLECTOMY Right 09/14/2013   Procedure:  PATELLECTOMY;  Surgeon: Meredith Pel, MD;  Location: [place];  Service: Orthopedics;  Laterality: Right;  . PROCTOSCOPY N/A 10/13/2014   Procedure: RIDGE PROCTOSCOPY;  Surgeon: [name], MD;  Location: [facility];  Service: General;  Laterality: N/A;  . REVISION TOTAL KNEE ARTHROPLASTY Bilateral right  04-02-2011/  left 1996 & 10-05-1999  . ROTATOR CUFF REPAIR Bilateral   . TONSILLECTOMY    . TOTAL KNEE ARTHROPLASTY Bilateral left 1994/  right 2000  . TOTAL KNEE REVISION Left 08/02/2015   Procedure: LEFT FEMORAL REVISION;  Surgeon: Gaynelle Arabian, MD;  Location: [facility];  Service: Orthopedics;  Laterality: Left;  . TRANSTHORACIC ECHOCARDIOGRAM  12-01-2009   Grade I diastolic dysfunction/  ef 60%/  moderate MR/  mild TR     OB History   No obstetric history on file.     Family History  Problem Relation Age of Onset  . Osteoarthritis Mother   . Heart failure Mother   . Colon cancer Maternal Uncle 18  . Heart Problems Father   . Cancer Sister 1       ? colon cancer   . Kidney cancer Brother 65  . Esophageal cancer Neg Hx   . Rectal cancer Neg Hx   . Stomach cancer Neg Hx   . Pancreatic cancer Neg Hx     Social History   Tobacco Use  . Smoking status: Former Smoker    Packs/day: 0.50    Years: 16.00    Pack years: 8.00    Types: Cigarettes    Start date: 89    Quit date: 07/16/1967    Years since quitting: 52.0  . Smokeless tobacco: Never Used  Substance Use Topics  . Alcohol use: No    Alcohol/week: 0.0 standard drinks  . Drug use: No    Home Medications Prior to Admission medications   Medication Sig Start Date End Date Taking? Authorizing Provider  acetaminophen (TYLENOL) 325 MG tablet Take 650 mg by mouth every 6 (six) hours as needed for moderate pain.    Yes [provider]  albuterol (PROAIR HFA) 108 (90 Base) MCG/ACT inhaler Inhale 2 puffs into the lungs every 6 (six) hours as needed for wheezing or shortness of breath. 04/05/19  Yes [name], MD  apixaban (ELIQUIS) 5 MG TABS tablet Take 1 tablet (5 mg total) by mouth 2 (two) times daily. 04/06/19  Yes Nafziger, [name], NP  Ascorbic Acid (VITAMIN C) 1000 MG tablet Take 1,000 mg by mouth daily.   Yes [provider]  benazepril (LOTENSIN) 40 MG tablet Take 1 tablet (40 mg total) by mouth daily. 04/06/19  Yes Nafziger, [name], NP  diltiazem (CARDIZEM CD) 120 MG 24 hr capsule Take 1 capsule by mouth as needed when Heart Rate is above 160 and Systolic Blood Pressure is above 100 mm/hg Patient taking differently: Take 120 mg by mouth daily.  06/09/19  Yes [name], Wonda Cheng, MD  esomeprazole (NEXIUM) 40 MG capsule Take 1 capsule (40 mg total) by mouth daily. 04/06/19  Yes Nafziger, [name], NP  ferrous sulfate 325 (65 FE) MG EC tablet Take 325 mg by mouth daily with breakfast.   Yes [provider]  fluticasone (FLOVENT HFA) 44 MCG/ACT inhaler Inhale 2 puffs into the lungs 2 (two) times daily. 03/27/18  Yes Colin Benton R, DO  furosemide (LASIX) 20 MG tablet Take 2 tablets (40 mg total) by mouth daily. Please keep upcoming appt in November with Dr. Acie Fredrickson before anymore refills. Thank you 06/09/19  Yes [name], Wonda Cheng, MD  gabapentin (NEURONTIN) 600 MG tablet Take 1 tablet (600 mg total) by mouth 2 (two) times daily. 04/06/19  Yes Nafziger, [name], NP  LORazepam (ATIVAN) 0.5 MG tablet 1 tablet twice daily as needed for anxiety or sleep Patient taking differently: Take 0.5 mg by mouth 2 (two) times daily as needed for anxiety or sleep.  09/09/18  Yes Nafziger, [name], NP  metoprolol tartrate (LOPRESSOR) 50 MG tablet Take 1.5 tablets (75 mg total) by mouth 2 (two) times daily. 04/06/19 07/10/19 Yes Nafziger, [name], NP  nitroGLYCERIN (NITROSTAT) 0.4 MG SL tablet Place 1 tablet (0.4 mg total) under the tongue every 5 (five) minutes as needed for  chest pain (x 3 pills). Reported on 09/01/2015 04/06/19  Yes Nafziger, [name], NP  sertraline (ZOLOFT) 50 MG tablet Take 1 tablet (50 mg total) by mouth  daily. 04/06/19  Yes Nafziger, [name], NP  Tiotropium Bromide Monohydrate (SPIRIVA RESPIMAT) 2.5 MCG/ACT AERS Inhale 2 puffs into the lungs daily. 04/05/19  Yes [name], MD  traZODone (DESYREL) 100 MG tablet TAKE 1/2 (ONE-HALF) TABLET BY MOUTH AT BEDTIME Patient taking differently: Take 50 mg by mouth at bedtime.  03/11/19  Yes Nafziger, [name], NP  triamcinolone cream (KENALOG) 0.1 % Apply 1 application topically 2 (two) times daily as needed (Eczema on legs). 06/08/19  Yes Nafziger, [name], NP  Spacer/Aero-Holding Chambers (E-Z SPACER) inhaler Use as instructed 03/20/18   [name], MD    Allergies    Patient has no known allergies.  Review of Systems   Review of Systems  All other systems reviewed and are negative.   Physical Exam Updated Vital Signs BP (!) 146/79   Pulse 78   Temp 97.7 F (36.5 C) (Oral)   Resp 19   SpO2 100%   Physical Exam Vitals and nursing note reviewed.  Constitutional:      Appearance: She is well-developed. She is not toxic-appearing or diaphoretic.  HENT:     Head: Normocephalic and atraumatic.     Right Ear: External ear normal.     Left Ear: External ear normal.  Eyes:     General: No scleral icterus.       Right eye: No discharge.        Left eye: No discharge.     Conjunctiva/sclera: Conjunctivae normal.  Neck:     Trachea: No tracheal deviation.  Cardiovascular:     Rate and Rhythm: Normal rate and regular rhythm.  Pulmonary:     Effort: Pulmonary effort is normal. No respiratory distress.     Breath sounds: Normal breath sounds. No stridor. No wheezing or rales.  Abdominal:     General: Bowel sounds are normal. There is no distension.     Palpations: Abdomen is soft.     Tenderness: There is no abdominal tenderness. There is no guarding or rebound.  Genitourinary:    Comments: No external mass or blood, ?internal hemorrhoid palpated on exam, no gross blood on rectal exam Musculoskeletal:        General: No tenderness.      Cervical back: Neck supple.  Skin:    General: Skin is warm and dry.     Findings: No rash.  Neurological:     Mental Status: She is alert.     Cranial Nerves: No cranial nerve deficit (no facial droop, extraocular movements intact, no slurred speech).     Sensory: No sensory deficit.     Motor: No abnormal muscle tone or seizure activity.     Coordination: Coordination normal.     ED Results / Procedures / Treatments   Labs (all labs ordered are listed, but only abnormal results are displayed) Labs Reviewed  CBC - Abnormal; Notable for the following components:      Result Value   Platelets 110 (*)    All other components within normal limits  COMPREHENSIVE METABOLIC PANEL - Abnormal; Notable for the following components:   BUN 49 (*)    Creatinine, Ser 1.30 (*)    AST 54 (*)    GFR calc non Af Amer 37 (*)    GFR calc Af Amer 43 (*)    All other  components within normal limits  PROTIME-INR - Abnormal; Notable for the following components:   Prothrombin Time 17.0 (*)    INR 1.4 (*)    All other components within normal limits  POC OCCULT BLOOD, ED - Abnormal; Notable for the following components:   Fecal Occult Bld POSITIVE (*)    All other components within normal limits  SARS CORONAVIRUS 2 (TAT 6-24 HRS)  LIPASE, BLOOD  URINALYSIS, ROUTINE W REFLEX MICROSCOPIC  TYPE AND SCREEN  ABO/RH  TROPONIN I (HIGH SENSITIVITY)  TROPONIN I (HIGH SENSITIVITY)    EKG EKG Interpretation  Date/Time:  Saturday July 10 2019 11:55:41 EST Ventricular Rate:  79 PR Interval:    QRS Duration: 82 QT Interval:  367 QTC Calculation: 421 R Axis:   112 Text Interpretation: Atrial fibrillation Probable lateral infarct, old Baseline wander in lead(s) V2 No significant change since last tracing Confirmed by Dorie Rank [id number]) on 07/10/2019 1:06:36 PM   Radiology DG Chest 2 View  Result Date: 07/10/2019 CLINICAL DATA:  Chest pain. EXAM: CHEST - 2 VIEW COMPARISON:  03/26/2018  FINDINGS: Borderline cardiomegaly. Pulmonary vascularity is normal. The lungs are clear except for minimal scarring at the left lung base. No effusions. No acute bone abnormality. Large chronic hiatal hernia. IMPRESSION: No active cardiopulmonary disease. Electronically Signed   By: Lorriane Shire M.D.   On: 07/10/2019 12:47    Procedures Procedures (including critical care time)  Medications Ordered in ED Medications  sodium chloride flush (NS) 0.9 % injection 3 mL (3 mLs Intravenous Not Given 07/10/19 1403)    ED Course  I have reviewed the triage vital signs and the nursing notes.  Pertinent labs & imaging results that were available during my care of the patient were reviewed by me and considered in my medical decision making (see chart for details).  Clinical Course as of Jul 09 1744  Sat Jul 10, 2019  1449 BUN and creatinine elevated but similar to previous values  Comprehensive metabolic panel(!) [JK]  9509 Normal   CBC(!) [JK]  1712 Discussed findings with daughter [name].  Daughter also mentions that the patient had been complaining of headache.  Patient had not mentioned that to me earlier.   [JK]    Clinical Course User Index [JK] Dorie Rank, MD   MDM Rules/Calculators/A&P                      Patient presented to the ED with complaints of chest pain as well as rectal bleeding.  Patient's chest pain is not severe.  She states she has history of having recurrent chest pain.  Patient does have history of heart disease but her symptoms are atypical.  She has 2 - troponins.  I doubt that she is having acute coronary syndrome.  I think the patient can follow-up as an outpatient with her cardiologist.  Patient did complain of rectal bleeding.  She does have history of colon cancer as well as bleeding ulcers.  I doubt that her symptoms are related to an upper GI bleed.  She has noticed bright red blood in her stool and her hemoglobin is normal.  Patient's had the symptoms for  several days now.  She does have Hemoccult positive stool but does not have any bright red blood per rectum on my exam.  I do think she needs to follow-up with her GI doctor but I think she is stable for outpatient follow-up.  Warning signs and precautions discussed.  Since the  patient had been complaining of headache I offered to do a Covid test.  We will get that done before she goes Final Clinical Impression(s) / ED Diagnoses Final diagnoses:  Rectal bleeding  Chest pain, unspecified type    Rx / DC Orders ED Discharge Orders    None       Dorie Rank, MD 07/10/19 1745

## 2019-07-10 NOTE — ED Triage Notes (Signed)
Pt states that she has been having chest pain that radiates down her left arm. Pt states that she has had bright red rectal bleeding, but only when she uses the bathroom. Pt complains of generalized weakness and headache as well.

## 2019-07-10 NOTE — Discharge Instructions (Addendum)
Follow up with your GI doctor to discuss further evaluation, possible colonoscopy.  Return to the ED for worsening symptoms.  We did send off a covid test, those results should be available within 24 hours

## 2019-07-11 LAB — ABO/RH: ABO/RH(D): A POS

## 2019-07-11 LAB — SARS CORONAVIRUS 2 (TAT 6-24 HRS): SARS Coronavirus 2: NEGATIVE

## 2019-07-12 DIAGNOSIS — M2550 Pain in unspecified joint: Secondary | ICD-10-CM | POA: Diagnosis not present

## 2019-07-12 DIAGNOSIS — M797 Fibromyalgia: Secondary | ICD-10-CM | POA: Diagnosis not present

## 2019-07-12 DIAGNOSIS — I5032 Chronic diastolic (congestive) heart failure: Secondary | ICD-10-CM | POA: Diagnosis not present

## 2019-07-12 DIAGNOSIS — M109 Gout, unspecified: Secondary | ICD-10-CM | POA: Diagnosis not present

## 2019-07-12 DIAGNOSIS — R768 Other specified abnormal immunological findings in serum: Secondary | ICD-10-CM | POA: Diagnosis not present

## 2019-07-12 DIAGNOSIS — M159 Polyosteoarthritis, unspecified: Secondary | ICD-10-CM | POA: Diagnosis not present

## 2019-07-12 DIAGNOSIS — N183 Chronic kidney disease, stage 3 unspecified: Secondary | ICD-10-CM | POA: Diagnosis not present

## 2019-07-12 DIAGNOSIS — M1180 Other specified crystal arthropathies, unspecified site: Secondary | ICD-10-CM | POA: Diagnosis not present

## 2019-07-19 NOTE — Progress Notes (Signed)
[place]   [phone] [phone]   Clinic Follow up Note   Patient Care Team: [name], NP as PCP - General (Family Medicine) [name], Wonda Cheng, MD as PCP - Cardiology (Cardiology) Dimitri Ped, RN as [facility] Pruitt, Royce Macadamia, [facility] as [place] (Pharmacist)  Date of Service:  07/21/2019  CHIEF COMPLAINT: Follow up multifocal colon cancer  SUMMARY OF ONCOLOGIC HISTORY: Oncology History Overview Note  Cancer Staging Cancer of ascending colon [facility]) Staging form: Colon and Rectum, AJCC 7th Edition - Clinical: Stage IIIB (T4a, N1, M0) - Unsigned     Cancer of ascending colon ([place])  06/02/2014 Initial Diagnosis   Adenocarcinoma, colon. she presented with anemia, nauseam anorexia and abdominal pain and [facility].   06/02/2014 Surgery   exploratory laparotomy, right hemicolectomy, and descending colostomy by Dr. Georgette Dover   06/02/2014 Pathologic Stage   multifocal (2) colon cancer at  ileocecal valve and ascending colon.,  mT4N1Mx, at least stage IIIB, grade 1, MMR normal, surgical margins negative.    06/02/2014 Tumor Marker   CEA 1.7   11/24/2014 Surgery   SIGMOID COLECTOMY AND COLSOTOMY CLOSURE with Donnie Mesa   03/06/2016 Imaging   CT chest, abdomen and pelvis without contrast showed stable remote surgical changes, no evidence of metastatic disease. Stable atherosclerotic calcifications.   09/12/2016 Imaging   CT CAP W/ CONTRAST 09/12/2016 IMPRESSION: 1. Status post partial right hemicolectomy, without evidence to suggest local recurrence of disease or metastatic disease to the chest, abdomen or pelvis. 2. Cardiomegaly with biatrial dilatation. 3. Aortic atherosclerosis, in addition to left main and 3 vessel coronary artery disease. 4. Moderate to large hiatal hernia.   12/25/2016 Procedure   Colonoscopy Impression: - Two 3 to 5 mm polyps in  the sigmoid colon and in the transverse colon, removed with a cold snare. Resceted and retrieved - Internal hemorrhoids - [place] surgeries as described. The examination was otherwise normal on direct and retroflexion views   07/22/2017 Imaging   CT CAP 07/22/17 IMPRESSION: 1. No evidence of local tumor recurrence at ileocolic anastomosis in the right abdomen. 2. No evidence of metastatic disease in the abdomen or pelvis. 3. New nonspecific patchy ground-glass nodularity in the right greater than left lungs. The ground-glass density is more suggestive of an inflammatory etiology. Recommend attention on follow-up chest CT in 3 months. 4. New nonspecific mild mediastinal lymphadenopathy. Given the above findings in the lungs, these nodes [place] be reactive, although short-term chest CT follow-up is again recommended in 3 months. 5. Liver surface appears finely irregular, question hepatic cirrhosis. Consider hepatic elastography for further liver fibrosis risk stratification, as clinically warranted. 6. Chronic findings include: Aortic Atherosclerosis (ICD10-I70.0). Left main and 3 vessel coronary atherosclerosis. Moderate hiatal hernia. Chronic mosaic attenuation in the lungs, most commonly due to air trapping from small airways disease.   10/26/2017 Imaging   CT-angio Chest 10/26/2017 IMPRESSION: 1. Negative for acute pulmonary embolus. 2. Right greater than left ground-glass density with increased nodularity in the right upper, middle and lower lobes, suspicious for pulmonary infection, possible atypical pneumonia. Short interval CT follow-up recommended following antibiotic trial and ensure clearing. Persistent mediastinal adenopathy, possibly reactive and not significantly changed since January 2019   10/27/2017 Imaging   CT AP 10/27/2017 IMPRESSION: 1. Mild hazy right basilar airspace opacity Meyn reflect mild asymmetric interstitial edema or possibly pneumonia. 2. Moderate hiatal hernia. 3.  Scattered coronary artery calcifications. 4.  Small left renal cyst.   10/28/2017 Pathology Results   10/28/2017 Surgical pathology Diagnosis Stomach, biopsy - ANTRAL AND OXYNTIC MUCOSA WITH SLIGHT CHRONIC INFLAMMATION. - WARTHIN-STARRY STAIN NEGATIVE FOR HELICOBACTER PYLORI. - NO INTESTINAL METAPLASIA, DYSPLASIA, OR MALIGNANCY.   04/06/2018 Imaging   CT Chest WO Contrast 04/06/18  IMPRESSION: 1. Less patchy opacity in the lower lobes compared to most recent CT. Areas of mosaic attenuation in the lower lobes currently is likely indicative of chronic small airways obstruction and atelectasis. No frank consolidation. There are areas of more typical appearing atelectatic change bilaterally. There is a stable 2 mm nodular opacity in the right lower lobe. No pleural effusion evident. There is mild underlying centrilobular emphysematous change.  2. Mildly prominent lymph node anterior to the carina, likely of reactive etiology. No progression of adenopathy.  3. Aortic atherosclerosis. Foci of great vessel and coronary artery calcification.  4.  Moderate hiatal hernia.  5.  Gallbladder absent.  Aortic Atherosclerosis (ICD10-I70.0) and Emphysema (ICD10-J43.9).      CURRENT THERAPY:  Surveillance   INTERVAL HISTORY:  [name] is here for a follow up of colon cancer. She was last seen by me 1 year ago. She presents to the clinic with her daughter. She notes she has significant swelling of right lower leg since 12/26 after drawer fell on upper ankle. For the past few days she has skin erythema with purplish discoloration of feet. She plans to have scans after this visit.  She also feels her left arm is numb and tingling. She notes she also had recent rectal bleeding which stopped after a few days. She went to ED and physician palpated something and recommended her rectal do further evaluation. She notes known intermittent right abdominal pain and pain in her lower back. Her pain is  stable.  She notes stable cough from her COPD.     REVIEW OF SYSTEMS:   Constitutional: Denies fevers, chills or abnormal weight loss Eyes: Denies blurriness of vision Ears, nose, mouth, throat, and face: Denies mucositis or sore throat Respiratory: Denies dyspnea or wheezes (+) stable cough from COPD Cardiovascular: Denies palpitation, chest discomfort or lower extremity swelling Gastrointestinal:  Denies nausea, heartburn or change in bowel habits (+) intermittent right abdominal pain  Skin: Denies abnormal skin rashes MSK: (+) Diffuse skin bruising, skin erythema and significant swelling of right lower leg and foot with pain (+) Lower back pain  Lymphatics: Denies new lymphadenopathy or easy bruising  Neurological:Denies numbness, tingling or new weaknesses Behavioral/Psych: Mood is stable, no new changes  All other systems were reviewed with the patient and are negative.  MEDICAL HISTORY:  Past Medical History:  Diagnosis Date   Adenocarcinoma, colon [facility]) Oncologist-- Dr Truitt Merle   Multifocal (2) Colon cancer @ ileocecal valve and ascending ( mT4N1Mx), Stage IIIB, grade I, MMR normal , 2 of 16 +lymph nodes, negative surgical margins---  06-02-2014  Right hemicolectomy w/ colostomy   Allergy    Anxiety    Asthma    inhaler "sometimes"   CAD (coronary artery disease)    a. LHC 4/09: pLAD 40, mDx 70-75, pLCx 30, mLCx 50, mRCA 90, EF 60% >> PCI: BMS x2 to RCA;  b. Myoview 8/12: normal   Cataract    bil cateracts removed   Chronic anemia    Chronic diastolic CHF (congestive heart failure) (HCC)    a. Echo 912 - Mild LVH, EF 55-60%, no RWMA, Gr 1 DD, mild MR, mild LAE, mild RAE, PASP 59  mmHg (mod to severe pulmo HTN)   Clotting disorder (HCC)    hx of dvt, tia on eliquis   Colostomy in place [facility])    s/p colostomy takedown 5/16   Colovaginal fistula    s/p colostomy >> colostomy takedown 5/16   COPD with chronic bronchitis (HCC)    Depression    Dysrhythmia     A fib   Essential hypertension    GERD (gastroesophageal reflux disease)    History of adenomatous polyp of colon    History of blood transfusion    History of DVT of lower extremity    History of hiatal hernia    History of TIA (transient ischemic attack)    a. Head CT 6/15: small chronic lacunar infarct in thalamus   Hyperlipidemia    OA (osteoarthritis)    Osteoporosis    Peripheral neuropathy    Pneumonia    Pulmonary HTN ([place])    a. PASP on Echo in 9/12:  59 mmHg   Renal insufficiency    Sigmoid diverticulosis    s/p sigmoid colectomy   Stroke ([place])    TIAs   Wears dentures    Wears glasses     SURGICAL HISTORY: Past Surgical History:  Procedure Laterality Date   ABDOMINAL HYSTERECTOMY     ANTERIOR CERVICAL DECOMP/DISCECTOMY FUSION  01-31-2009   C5 -- 7   APPENDECTOMY     Bone spurs Bilateral    Feet   CARDIOVASCULAR STRESS TEST  02-26-2011   Normal lexiscan no exercise study/  no ischemia/  normal LV function and wall motion, ef 67%   CARPAL TUNNEL RELEASE Right    CATARACT EXTRACTION W/ INTRAOCULAR LENS  IMPLANT, BILATERAL Bilateral    CHOLECYSTECTOMY     COLOSTOMY N/A 06/02/2014   Procedure: DIVERTING DESCENDING END COLOSTOMY;  Surgeon: Donnie Mesa, MD;  Location: [place];  Service: General;  Laterality: N/A;   CORONARY ANGIOPLASTY WITH STENT PLACEMENT  11-04-2007  dr bensimhon   BMS x2 to RCA/  mild Non-obstructive disease LAD/  normal LVF   DILATION AND CURETTAGE OF UTERUS     ESOPHAGOGASTRODUODENOSCOPY N/A 10/28/2017   Procedure: ESOPHAGOGASTRODUODENOSCOPY (EGD);  Surgeon: Yetta Flock, MD;  Location: [facility] ENDOSCOPY;  Service: Gastroenterology;  Laterality: N/A;   EVALUATION UNDER ANESTHESIA WITH ANAL FISTULECTOMY N/A 10/13/2014   Procedure: ANAL EXAM UNDER ANESTHESIA ;  Surgeon: [name], MD;  Location: [facility];  Service: General;  Laterality: N/A;   HAND SURGERY     Tendon repair   LAPAROSCOPIC SIGMOID COLECTOMY  N/A 11/24/2014   Procedure: SIGMOID COLECTOMY AND COLSOTOMY CLOSURE;  Surgeon: Donnie Mesa, MD;  Location: [place];  Service: General;  Laterality: N/A;   LUMBAR LAMINECTOMY  10-29-2002,  1969   Left  L3 -- 4  decompression   PARTIAL COLECTOMY N/A 06/02/2014   Procedure: RIGHT PARTIAL COLECTOMY ;  Surgeon: Donnie Mesa, MD;  Location: [place];  Service: General;  Laterality: N/A;   PATELLECTOMY Right 09/14/2013   Procedure: Wendi Maya;  Surgeon: Meredith Pel, MD;  Location: [place];  Service: Orthopedics;  Laterality: Right;   PROCTOSCOPY N/A 10/13/2014   Procedure: RIDGE PROCTOSCOPY;  Surgeon: [name], MD;  Location: [facility];  Service: General;  Laterality: N/A;   REVISION TOTAL KNEE ARTHROPLASTY Bilateral right  04-02-2011/  left 1996 & 10-05-1999   ROTATOR CUFF REPAIR Bilateral    TONSILLECTOMY     TOTAL KNEE ARTHROPLASTY Bilateral left 1994/  right 2000   TOTAL KNEE REVISION Left  08/02/2015   Procedure: LEFT FEMORAL REVISION;  Surgeon: Gaynelle Arabian, MD;  Location: [facility];  Service: Orthopedics;  Laterality: Left;   TRANSTHORACIC ECHOCARDIOGRAM  12-01-2009   Grade I diastolic dysfunction/  ef 60%/  moderate MR/  mild TR    I have reviewed the social history and family history with the patient and they are unchanged from previous note.  ALLERGIES:  has No Known Allergies.  MEDICATIONS:  Current Outpatient Medications  Medication Sig Dispense Refill   acetaminophen (TYLENOL) 325 MG tablet Take 650 mg by mouth every 6 (six) hours as needed for moderate pain.      albuterol (PROAIR HFA) 108 (90 Base) MCG/ACT inhaler Inhale 2 puffs into the lungs every 6 (six) hours as needed for wheezing or shortness of breath. 6.7 g 6   allopurinol (ZYLOPRIM) 100 MG tablet Take 100 mg by mouth daily.     apixaban (ELIQUIS) 5 MG TABS tablet Take 1 tablet (5 mg total) by mouth 2 (two) times daily. 180 tablet 0   Ascorbic Acid (VITAMIN C) 1000 MG tablet Take 1,000 mg by mouth daily.      benazepril (LOTENSIN) 40 MG tablet Take 1 tablet (40 mg total) by mouth daily. 90 tablet 0   colchicine 0.6 MG tablet Take 0.6 mg by mouth daily.     diltiazem (CARDIZEM CD) 120 MG 24 hr capsule Take 1 capsule by mouth as needed when Heart Rate is above 967 and Systolic Blood Pressure is above 100 mm/hg (Patient taking differently: Take 120 mg by mouth daily. ) 90 capsule 3   esomeprazole (NEXIUM) 40 MG capsule Take 1 capsule (40 mg total) by mouth daily. 90 capsule 0   ferrous sulfate 325 (65 FE) MG EC tablet Take 325 mg by mouth daily with breakfast.     fluticasone (FLOVENT HFA) 44 MCG/ACT inhaler Inhale 2 puffs into the lungs 2 (two) times daily. 1 Inhaler 0   furosemide (LASIX) 20 MG tablet Take 2 tablets (40 mg total) by mouth daily. Please keep upcoming appt in November with Dr. Acie Fredrickson before anymore refills. Thank you 180 tablet 3   gabapentin (NEURONTIN) 600 MG tablet Take 1 tablet (600 mg total) by mouth 2 (two) times daily. 180 tablet 0   LORazepam (ATIVAN) 0.5 MG tablet 1 tablet twice daily as needed for anxiety or sleep (Patient taking differently: Take 0.5 mg by mouth 2 (two) times daily as needed for anxiety or sleep. ) 60 tablet 2   nitroGLYCERIN (NITROSTAT) 0.4 MG SL tablet Place 1 tablet (0.4 mg total) under the tongue every 5 (five) minutes as needed for chest pain (x 3 pills). Reported on 09/01/2015 25 tablet 3   sertraline (ZOLOFT) 50 MG tablet Take 1 tablet (50 mg total) by mouth daily. 90 tablet 0   Spacer/Aero-Holding Chambers (E-Z SPACER) inhaler Use as instructed 1 each 2   Tiotropium Bromide Monohydrate (SPIRIVA RESPIMAT) 2.5 MCG/ACT AERS Inhale 2 puffs into the lungs daily. 4 g 3   traZODone (DESYREL) 100 MG tablet TAKE 1/2 (ONE-HALF) TABLET BY MOUTH AT BEDTIME (Patient taking differently: Take 50 mg by mouth at bedtime. ) 45 tablet 1   triamcinolone cream (KENALOG) 0.1 % Apply 1 application topically 2 (two) times daily as needed (Eczema on legs). 30 g 2     metoprolol tartrate (LOPRESSOR) 50 MG tablet Take 1.5 tablets (75 mg total) by mouth 2 (two) times daily. 270 tablet 0   No current facility-administered medications for this visit.  PHYSICAL EXAMINATION: ECOG PERFORMANCE STATUS: 3 - Symptomatic, >50% confined to bed  Vitals:   07/21/19 1053  BP: 131/82  Pulse: 72  Resp: 18  Temp: 98.2 F (36.8 C)  SpO2: 100%   Filed Weights   07/21/19 1053  Weight: 195 lb 12.8 oz (88.8 kg)    GENERAL:alert, no distress and comfortable SKIN: skin color, texture, turgor are normal, no rashes or significant lesions EYES: normal, Conjunctiva are pink and non-injected, sclera clear  NECK: supple, thyroid normal size, non-tender, without nodularity LYMPH:  no palpable lymphadenopathy in the cervical, axillary  LUNGS: clear to auscultation and percussion with normal breathing effort HEART: regular rate & rhythm and no murmurs and no lower extremity edema ABDOMEN:abdomen soft, non-tender and normal bowel sounds Musculoskeletal:  (+) Diffuse skin bruising, skin erythema and significant swelling of lower leg and foot with pain, appears cellulitis of right lower leg NEURO: alert & oriented x 3 with fluent speech, no focal motor/sensory deficits  LABORATORY DATA:  I have reviewed the data as listed CBC Latest Ref Rng & Units 07/21/2019 07/10/2019 07/20/2018  WBC 4.0 - 10.5 K/uL 5.8 6.3 6.7  Hemoglobin 12.0 - 15.0 g/dL 11.7(L) 12.9 13.3  Hematocrit 36.0 - 46.0 % 37.0 41.2 43.3  Platelets 150 - 400 K/uL 162 110(L) 131(L)     CMP Latest Ref Rng & Units 07/10/2019 12/10/2018 07/20/2018  Glucose 70 - 99 mg/dL 99 100(H) 88  BUN 8 - 23 mg/dL 49(H) 39(H) 31(H)  Creatinine 0.44 - 1.00 mg/dL 1.30(H) 1.51(H) 1.22(H)  Sodium 135 - 145 mmol/L 141 141 143  Potassium 3.5 - 5.1 mmol/L 4.7 4.9 4.6  Chloride 98 - 111 mmol/L 106 103 105  CO2 22 - 32 mmol/L '23 30 27  '$ Calcium 8.9 - 10.3 mg/dL 9.4 9.8 9.6  Total Protein 6.5 - 8.1 g/dL 8.1 - 7.9  Total Bilirubin  0.3 - 1.2 mg/dL 1.2 - 0.6  Alkaline Phos 38 - 126 U/L 48 - 60  AST 15 - 41 U/L 54(H) - 25  ALT 0 - 44 U/L 28 - 18      RADIOGRAPHIC STUDIES: I have personally reviewed the radiological images as listed and agreed with the findings in the report. No results found.   ASSESSMENT & PLAN:  [name] is a 86 y.o. female with    1. Multifocal invasive colon adenocarcinoma, mT4N1M0, stage IIIB, grade 1, MMR normal. -She was diagnosed in 05/2014. She is s/p right hemicolectomy and sigmoid colectomy. Both of her colon cancer were completely surgically resected, however she does have high risk of cancer recurrence in the future due to locally advanced stage. Due to her advanced age, adjuvant chemo was not recommended  -From a cancer standpoint she is clinically doing well. Labs reviewed, CBC WNL except mild anemia. She did have recent GI bleeding. I recommend scope with Dr Henrene Pastor soon, she is agreeable. She has stable known abdominal pain. I do not have high clinical suspicion for recurrence, she has other possibly causes of this.  -Given she is 5 years from diagnosis her risk of cancer recurrence is minimal now. She can f/u with me as needed in the future. She is agreeable.    2. Propable Cellulitis of right lower leg and foot with pain, after fall  -After drawer fell on her right upper ankle on 07/10/19, she has had significant swelling with diffuse bruising, skin erythema, and warm to touch on exam today (07/21/19) -She plans to have [place] today. I agree  with this to rule out blood clot. I also recommend Xray to rule out fracture. If negative, I will call in oral antibiotics. If no improvement she should return to her PCP. I offered her to do the work up at [name], she prefers to go to the ED in [place]  3. Genetics -Her daughter met our genetic counselor before and genetic testing was negative. The patient declined genetic testing at this point.  4. Anemia, Recent Rectal bleeding -Previously  resolved but with recent few days of Rectal bleeding she has mild anemia today, hg 11.7.  -ED physician on 07/10/19 did rectal exam and did palpate something and recommended she see Dr. Henrene Pastor for further evaluate. I agree, I do not have high concern this is related to her colon cancer.  -I encouraged her to increase oral iron to BID for the next month.   5. CAD, CHF -She will continue follow-up with her cardiologist Dr. Kathlen Mody -Pt is still taking Lasix and has minimal leg swelling at baseline.  -Follow-up with her primary care physician for other medical issues.  6. Hiatal Hernia and abdominal pain -We discussed that she should see Dr. Henrene Pastor about this  -She is on Nexium -Moderate on 12/2018 CT chest    7. CKD stage III -Avoid nephrotoxic agents including IV contrast and NSAIDs.    PLAN -Send message to Dr. Henrene Pastor for her colonoscopy or sigmoidoscopy -She has completed 5 years of surveillance, I will see her only as needed in the  -She plans to go to the emergency room in [place] today    No problem-specific Assessment & Plan notes found for this encounter.   No orders of the defined types were placed in this encounter.  All questions were answered. The patient knows to call the clinic with any problems, questions or concerns. No barriers to learning was detected. The total time spent in the appointment was 20 minutes.     Truitt Merle, MD 07/21/2019   I, Joslyn Devon, am acting as scribe for Truitt Merle, MD.   I have reviewed the above documentation for accuracy and completeness, and I agree with the above.

## 2019-07-21 ENCOUNTER — Emergency Department (HOSPITAL_BASED_OUTPATIENT_CLINIC_OR_DEPARTMENT_OTHER)
Admission: EM | Admit: 2019-07-21 | Discharge: 2019-07-21 | Disposition: A | Discharge status: Home / Self Care | Payer: HMO | Attending: Emergency Medicine | Admitting: Emergency Medicine

## 2019-07-21 ENCOUNTER — Ambulatory Visit: Payer: Self-pay | Admitting: *Deleted

## 2019-07-21 ENCOUNTER — Encounter (HOSPITAL_BASED_OUTPATIENT_CLINIC_OR_DEPARTMENT_OTHER): Payer: Self-pay

## 2019-07-21 ENCOUNTER — Emergency Department (HOSPITAL_BASED_OUTPATIENT_CLINIC_OR_DEPARTMENT_OTHER): Payer: HMO

## 2019-07-21 ENCOUNTER — Inpatient Hospital Stay (HOSPITAL_BASED_OUTPATIENT_CLINIC_OR_DEPARTMENT_OTHER): Payer: HMO | Admitting: Hematology

## 2019-07-21 ENCOUNTER — Other Ambulatory Visit: Payer: Self-pay

## 2019-07-21 ENCOUNTER — Inpatient Hospital Stay: Payer: HMO | Attending: Hematology

## 2019-07-21 ENCOUNTER — Ambulatory Visit: Payer: Self-pay | Admitting: Adult Health

## 2019-07-21 VITALS — BP 131/82 | HR 72 | Temp 98.2°F | Resp 18 | Ht 63.5 in | Wt 195.8 lb

## 2019-07-21 DIAGNOSIS — D649 Anemia, unspecified: Secondary | ICD-10-CM | POA: Diagnosis not present

## 2019-07-21 DIAGNOSIS — I130 Hypertensive heart and chronic kidney disease with heart failure and stage 1 through stage 4 chronic kidney disease, or unspecified chronic kidney disease: Secondary | ICD-10-CM | POA: Insufficient documentation

## 2019-07-21 DIAGNOSIS — M1990 Unspecified osteoarthritis, unspecified site: Secondary | ICD-10-CM | POA: Insufficient documentation

## 2019-07-21 DIAGNOSIS — E785 Hyperlipidemia, unspecified: Secondary | ICD-10-CM | POA: Diagnosis not present

## 2019-07-21 DIAGNOSIS — R109 Unspecified abdominal pain: Secondary | ICD-10-CM | POA: Diagnosis not present

## 2019-07-21 DIAGNOSIS — I2510 Atherosclerotic heart disease of native coronary artery without angina pectoris: Secondary | ICD-10-CM | POA: Insufficient documentation

## 2019-07-21 DIAGNOSIS — R079 Chest pain, unspecified: Secondary | ICD-10-CM | POA: Diagnosis not present

## 2019-07-21 DIAGNOSIS — M79604 Pain in right leg: Secondary | ICD-10-CM | POA: Diagnosis not present

## 2019-07-21 DIAGNOSIS — Z8673 Personal history of transient ischemic attack (TIA), and cerebral infarction without residual deficits: Secondary | ICD-10-CM | POA: Diagnosis not present

## 2019-07-21 DIAGNOSIS — I5032 Chronic diastolic (congestive) heart failure: Secondary | ICD-10-CM | POA: Diagnosis not present

## 2019-07-21 DIAGNOSIS — R202 Paresthesia of skin: Secondary | ICD-10-CM | POA: Insufficient documentation

## 2019-07-21 DIAGNOSIS — F418 Other specified anxiety disorders: Secondary | ICD-10-CM | POA: Insufficient documentation

## 2019-07-21 DIAGNOSIS — Z79899 Other long term (current) drug therapy: Secondary | ICD-10-CM | POA: Diagnosis not present

## 2019-07-21 DIAGNOSIS — I272 Pulmonary hypertension, unspecified: Secondary | ICD-10-CM | POA: Insufficient documentation

## 2019-07-21 DIAGNOSIS — I4891 Unspecified atrial fibrillation: Secondary | ICD-10-CM | POA: Diagnosis not present

## 2019-07-21 DIAGNOSIS — L03115 Cellulitis of right lower limb: Secondary | ICD-10-CM | POA: Diagnosis not present

## 2019-07-21 DIAGNOSIS — I700 Atherosclerosis of aorta: Secondary | ICD-10-CM | POA: Insufficient documentation

## 2019-07-21 DIAGNOSIS — J449 Chronic obstructive pulmonary disease, unspecified: Secondary | ICD-10-CM | POA: Insufficient documentation

## 2019-07-21 DIAGNOSIS — C182 Malignant neoplasm of ascending colon: Secondary | ICD-10-CM | POA: Insufficient documentation

## 2019-07-21 DIAGNOSIS — R200 Anesthesia of skin: Secondary | ICD-10-CM | POA: Diagnosis not present

## 2019-07-21 DIAGNOSIS — K219 Gastro-esophageal reflux disease without esophagitis: Secondary | ICD-10-CM | POA: Insufficient documentation

## 2019-07-21 DIAGNOSIS — Z85038 Personal history of other malignant neoplasm of large intestine: Secondary | ICD-10-CM | POA: Insufficient documentation

## 2019-07-21 DIAGNOSIS — M545 Low back pain: Secondary | ICD-10-CM | POA: Insufficient documentation

## 2019-07-21 DIAGNOSIS — I110 Hypertensive heart disease with heart failure: Secondary | ICD-10-CM | POA: Diagnosis not present

## 2019-07-21 DIAGNOSIS — S8991XA Unspecified injury of right lower leg, initial encounter: Secondary | ICD-10-CM | POA: Diagnosis not present

## 2019-07-21 DIAGNOSIS — M2578 Osteophyte, vertebrae: Secondary | ICD-10-CM | POA: Diagnosis not present

## 2019-07-21 DIAGNOSIS — W208XXA Other cause of strike by thrown, projected or falling object, initial encounter: Secondary | ICD-10-CM | POA: Diagnosis not present

## 2019-07-21 DIAGNOSIS — Z7901 Long term (current) use of anticoagulants: Secondary | ICD-10-CM | POA: Diagnosis not present

## 2019-07-21 DIAGNOSIS — Z87891 Personal history of nicotine dependence: Secondary | ICD-10-CM | POA: Diagnosis not present

## 2019-07-21 DIAGNOSIS — M7989 Other specified soft tissue disorders: Secondary | ICD-10-CM | POA: Diagnosis not present

## 2019-07-21 DIAGNOSIS — M4313 Spondylolisthesis, cervicothoracic region: Secondary | ICD-10-CM | POA: Diagnosis not present

## 2019-07-21 DIAGNOSIS — S99921A Unspecified injury of right foot, initial encounter: Secondary | ICD-10-CM | POA: Diagnosis not present

## 2019-07-21 DIAGNOSIS — N183 Chronic kidney disease, stage 3 unspecified: Secondary | ICD-10-CM | POA: Insufficient documentation

## 2019-07-21 DIAGNOSIS — R0602 Shortness of breath: Secondary | ICD-10-CM | POA: Diagnosis not present

## 2019-07-21 DIAGNOSIS — M47813 Spondylosis without myelopathy or radiculopathy, cervicothoracic region: Secondary | ICD-10-CM | POA: Diagnosis not present

## 2019-07-21 DIAGNOSIS — M79671 Pain in right foot: Secondary | ICD-10-CM | POA: Diagnosis not present

## 2019-07-21 DIAGNOSIS — K449 Diaphragmatic hernia without obstruction or gangrene: Secondary | ICD-10-CM | POA: Insufficient documentation

## 2019-07-21 DIAGNOSIS — M4803 Spinal stenosis, cervicothoracic region: Secondary | ICD-10-CM | POA: Diagnosis not present

## 2019-07-21 LAB — CBC WITH DIFFERENTIAL/PLATELET
Abs Immature Granulocytes: 0.02 10*3/uL (ref 0.00–0.07)
Basophils Absolute: 0 10*3/uL (ref 0.0–0.1)
Basophils Relative: 1 %
Eosinophils Absolute: 0.1 10*3/uL (ref 0.0–0.5)
Eosinophils Relative: 2 %
HCT: 36.2 % (ref 36.0–46.0)
Hemoglobin: 11.3 g/dL — ABNORMAL LOW (ref 12.0–15.0)
Immature Granulocytes: 0 %
Lymphocytes Relative: 14 %
Lymphs Abs: 0.9 10*3/uL (ref 0.7–4.0)
MCH: 30.9 pg (ref 26.0–34.0)
MCHC: 31.2 g/dL (ref 30.0–36.0)
MCV: 98.9 fL (ref 80.0–100.0)
Monocytes Absolute: 0.8 10*3/uL (ref 0.1–1.0)
Monocytes Relative: 13 %
Neutro Abs: 4.3 10*3/uL (ref 1.7–7.7)
Neutrophils Relative %: 70 %
Platelets: 162 10*3/uL (ref 150–400)
RBC: 3.66 MIL/uL — ABNORMAL LOW (ref 3.87–5.11)
RDW: 14.2 % (ref 11.5–15.5)
WBC: 6.1 10*3/uL (ref 4.0–10.5)
nRBC: 0 % (ref 0.0–0.2)

## 2019-07-21 LAB — CBC WITH DIFFERENTIAL (CANCER CENTER ONLY)
Abs Immature Granulocytes: 0.02 10*3/uL (ref 0.00–0.07)
Basophils Absolute: 0 10*3/uL (ref 0.0–0.1)
Basophils Relative: 0 %
Eosinophils Absolute: 0.1 10*3/uL (ref 0.0–0.5)
Eosinophils Relative: 2 %
HCT: 37 % (ref 36.0–46.0)
Hemoglobin: 11.7 g/dL — ABNORMAL LOW (ref 12.0–15.0)
Immature Granulocytes: 0 %
Lymphocytes Relative: 16 %
Lymphs Abs: 1 10*3/uL (ref 0.7–4.0)
MCH: 31.1 pg (ref 26.0–34.0)
MCHC: 31.6 g/dL (ref 30.0–36.0)
MCV: 98.4 fL (ref 80.0–100.0)
Monocytes Absolute: 0.8 10*3/uL (ref 0.1–1.0)
Monocytes Relative: 14 %
Neutro Abs: 4 10*3/uL (ref 1.7–7.7)
Neutrophils Relative %: 68 %
Platelet Count: 162 10*3/uL (ref 150–400)
RBC: 3.76 MIL/uL — ABNORMAL LOW (ref 3.87–5.11)
RDW: 14.3 % (ref 11.5–15.5)
WBC Count: 5.8 10*3/uL (ref 4.0–10.5)
nRBC: 0 % (ref 0.0–0.2)

## 2019-07-21 LAB — COMPREHENSIVE METABOLIC PANEL
ALT: 20 U/L (ref 0–44)
AST: 25 U/L (ref 15–41)
Albumin: 3.7 g/dL (ref 3.5–5.0)
Alkaline Phosphatase: 51 U/L (ref 38–126)
Anion gap: 9 (ref 5–15)
BUN: 46 mg/dL — ABNORMAL HIGH (ref 8–23)
CO2: 27 mmol/L (ref 22–32)
Calcium: 8.7 mg/dL — ABNORMAL LOW (ref 8.9–10.3)
Chloride: 103 mmol/L (ref 98–111)
Creatinine, Ser: 1.52 mg/dL — ABNORMAL HIGH (ref 0.44–1.00)
GFR calc Af Amer: 35 mL/min — ABNORMAL LOW (ref >60)
GFR calc non Af Amer: 31 mL/min — ABNORMAL LOW (ref >60)
Glucose, Bld: 135 mg/dL — ABNORMAL HIGH (ref 70–99)
Potassium: 5.2 mmol/L — ABNORMAL HIGH (ref 3.5–5.1)
Sodium: 139 mmol/L (ref 135–145)
Total Bilirubin: 0.6 mg/dL (ref 0.3–1.2)
Total Protein: 7.3 g/dL (ref 6.5–8.1)

## 2019-07-21 LAB — PROTIME-INR
INR: 1.4 — ABNORMAL HIGH (ref 0.8–1.2)
Prothrombin Time: 17.3 seconds — ABNORMAL HIGH (ref 11.4–15.2)

## 2019-07-21 LAB — TROPONIN I (HIGH SENSITIVITY)
Troponin I (High Sensitivity): 6 ng/L (ref <18)
Troponin I (High Sensitivity): 6 ng/L (ref <18)

## 2019-07-21 LAB — BRAIN NATRIURETIC PEPTIDE: B Natriuretic Peptide: 354.5 pg/mL — ABNORMAL HIGH (ref 0.0–100.0)

## 2019-07-21 MED ORDER — HYDROCODONE-ACETAMINOPHEN 5-325 MG PO TABS
1.0000 | ORAL_TABLET | Freq: Once | ORAL | Status: AC
  Administered 2019-07-21: 1 via ORAL
  Filled 2019-07-21: qty 1

## 2019-07-21 MED ORDER — HYDROCODONE-ACETAMINOPHEN 5-325 MG PO TABS: 1.0000 | ORAL_TABLET | Freq: Four times a day (QID) | ORAL | 0 refills | Status: DC | PRN

## 2019-07-21 MED ORDER — IOHEXOL 300 MG/ML  SOLN
100.0000 mL | Freq: Once | INTRAMUSCULAR | Status: AC | PRN
  Administered 2019-07-21: 100 mL via INTRAVENOUS

## 2019-07-21 MED ORDER — DOXYCYCLINE HYCLATE 100 MG PO CAPS: 100.0000 mg | ORAL_CAPSULE | Freq: Two times a day (BID) | ORAL | 0 refills | Status: AC

## 2019-07-21 NOTE — ED Notes (Signed)
Spoke with patients daughter on the phone and update given. Explained to daughter that I will continue to update her as the patients care progresses.

## 2019-07-21 NOTE — Telephone Encounter (Signed)
Will monitor for ED Arrival 

## 2019-07-21 NOTE — Discharge Instructions (Addendum)
As discussed, all of your labs and images were unremarkable for acute findings. Your neck imaging showed some slippage of the vertebra which could be causing your numbness/tingling. I want you to follow-up with your neck surgeon for further evaluation. Your leg does not have a blood clot or an underlying fracture. I am going to send you home with an antibiotic. Take twice a day for 7 days. Follow-up with your PCP within the next week to evaluate healing of your leg. You Leist take over the counter Tylenol as needed for pain of your right leg. Return to the ER for new or worsening symptoms.

## 2019-07-21 NOTE — ED Notes (Signed)
Spoke with patients daughter, updated that pt is in CT at this time.

## 2019-07-21 NOTE — ED Triage Notes (Signed)
Pt states she injured right LE 12/26 when a drawer fell on her leg-redness/swelling noted-to triage in w/c-NAD

## 2019-07-21 NOTE — Telephone Encounter (Signed)
  Daughter [name] calling in with mother on speaker phone.    She dropped a drawer on her right leg on 07/10/2019.   It hit her on the shin.   Her right foot is blue/purple and swollen.   It looks like there is a blood clot on the inside of her leg just above the ankle per [name].   The leg is really painful she can hardly walk on it.   The leg is swollen to just above her knee.    She is on Eliquis after having 2 mini strokes. She is also c/o having numbness and tingling in her left arm unrelated to the drawer incident.  I have referred her to the ED.   They are going to the ED at [facility] now.   Daughter, [name] is going to take her now.  I sent my notes to [name], NP office.   Reason for Disposition . Sounds like a serious injury to the triager    Right foot is blue/purple colored as well as the leg being swollen above the knee.   "It looks like a blood clot on the inside of the leg just above the ankle". Also c/o left arm being numb and tingling.  Answer Assessment - Initial Assessment Questions 1. MECHANISM: "How did the injury happen?" (e.g., twisting injury, direct blow)      [name], daughter is on speaker phone with mother. She dropped a drawer on her leg on Dec. 26th.   She's not sleeping now.  2. ONSET: "When did the injury happen?" (Minutes or hours ago)      Dec. 26th 3. LOCATION: "Where is the injury located?"      It hit on the shinn and it's swollen down to the ankle and foot and it's swollen above her knee.   We've been keeping ice on it and using Tylenol. 4. APPEARANCE of INJURY: "What does the injury look like?"  (e.g., deformity of leg)     See above. 5. SEVERITY: "Can you put weight on that leg?" "Can you walk?"      She can walk a little but not much. 6. SIZE: For cuts, bruises, or swelling, ask: "How large is it?" (e.g., inches or centimeters)      It looks like a blood clot on the inside of her leg just above the ankle.   She takes  Eluiquis.   7. PAIN: "Is there pain?" If so, ask: "How bad is the pain?"  (Scale 1-10; or mild, moderate, severe)     Yes 8. TETANUS: For any breaks in the skin, ask: "When was the last tetanus booster?"     I  Had one in the last 10 years. 9. OTHER SYMPTOMS: "Do you have any other symptoms?"      I'm having numbness and tingling in the right leg I dropped the drawer on. She is having numbness and tingling in the left arm and hand.    This is separate from the leg injury.   I've had a couple of mini strokes.   I'm on the blood thinner to keep me from having a stroke. 10. PREGNANCY: "Is there any chance you are pregnant?" "When was your last menstrual period?"       N/A due to age  Protocols used: [place]

## 2019-07-21 NOTE — ED Notes (Signed)
Pt returned from scans.  

## 2019-07-21 NOTE — Telephone Encounter (Signed)
Noted. FYI 

## 2019-07-21 NOTE — ED Provider Notes (Signed)
[facility] EMERGENCY DEPARTMENT Provider Note   CSN: [id number] Arrival date & time: 07/21/19  1211     History Chief Complaint  Patient presents with  . Leg Injury    [name] is a 86 y.o. female with a past medical history significant for adenocarcinoma of the colon, anxiety, asthma, CAD, chronic anemia, chronic diastolic congestive heart failure, history of DVT, history of TIAs chronically on Eliquis, clotting disorder, COPD, HTN, A. Fib, and hyperlipidemia who presents to the ED due to sudden onset of right leg pain.  Patient states on 12/26 a drawer fell directly on her shin causing sudden onset of 10/10 throbbing pain that is worse with movement. She normally ambulates with a walker and is able to bear some weight. She has tried Tylenol and ice with moderate relief. She is currently on Eliquis and has been compliant with her medication. Right leg pain is associated with ecchymosis, edema, and erythema over the medial aspect of her shin. Patient also notes she has been having left arm numbness/tingling x 4-5 days. Patient notes a chronic issue of neck pain. She has had previous cervical surgeries. Patient notes numbness/tingling that radiates from her left shoulder down into the 1st to 3rd fingers which is worse at night. Patient notes numbness/tingling goes away after rubbing her arm. She has a history of TIAs. Numbness/tingling is associated with subjective LUE weakness. Patient denies changes to speech and changes to speech.   Chart reviewed. Reviewed note from Dr. Richardson Landry who evaluated the patient today. He has some concern about possible cellulitis of right lower leg, but felt blood clot and underlying fracture needs to be ruled out prior to antibiotics.   Past Medical History:  Diagnosis Date  . Adenocarcinoma, colon [facility]) Oncologist-- Dr Truitt Merle   Multifocal (2) Colon cancer @ ileocecal valve and ascending ( [id number]), Stage IIIB, grade I, MMR normal , 2 of 16 +lymph  nodes, negative surgical margins---  06-02-2014  Right hemicolectomy w/ colostomy  . Allergy   . Anxiety   . Asthma    inhaler "sometimes"  . CAD (coronary artery disease)    a. LHC 4/09: pLAD 40, mDx 70-75, pLCx 30, mLCx 50, mRCA 90, EF 60% >> PCI: BMS x2 to RCA;  b. Myoview 8/12: normal  . Cataract    bil cateracts removed  . Chronic anemia   . Chronic diastolic CHF (congestive heart failure) (HCC)    a. Echo 912 - Mild LVH, EF 55-60%, no RWMA, Gr 1 DD, mild MR, mild LAE, mild RAE, PASP 59 mmHg (mod to severe pulmo HTN)  . Clotting disorder (HCC)    hx of dvt, tia on eliquis  . Colostomy in place [facility])    s/p colostomy takedown 5/16  . Colovaginal fistula    s/p colostomy >> colostomy takedown 5/16  . COPD with chronic bronchitis ([place])   . Depression   . Dysrhythmia    A fib  . Essential hypertension   . GERD (gastroesophageal reflux disease)   . History of adenomatous polyp of colon   . History of blood transfusion   . History of DVT of lower extremity   . History of hiatal hernia   . History of TIA (transient ischemic attack)    a. Head CT 6/15: small chronic lacunar infarct in thalamus  . Hyperlipidemia   . OA (osteoarthritis)   . Osteoporosis   . Peripheral neuropathy   . Pneumonia   . Pulmonary HTN ([place])  a. PASP on Echo in 9/12:  59 mmHg  . Renal insufficiency   . Sigmoid diverticulosis    s/p sigmoid colectomy  . Stroke ([place])    TIAs  . Wears dentures   . Wears glasses     Patient Active Problem List   Diagnosis Date Noted  . Centrilobular emphysema ([place]) 04/06/2019  . Gastric ulcer   . Upper GI bleed   . Thrombocytopenia ([place]) 10/27/2017  . Abnormal stress test 10/27/2017  . Moderate mitral regurgitation 10/27/2017  . Acute on chronic diastolic CHF (congestive heart failure) ([place]) 10/27/2017  . CAP (community acquired pneumonia) 10/26/2017  . Displaced fracture of lateral malleolus of right fibula, subsequent encounter for closed fracture with  routine healing 12/27/2016  . Abdominal pain, epigastric 11/12/2016  . Nausea and vomiting 11/12/2016  . Iron deficiency anemia 11/12/2016  . Chronic anticoagulation 11/12/2016  . Chronic atrial fibrillation ([place]) 08/05/2016  . Coronary artery disease of native artery of native heart with stable angina pectoris ([place]) 08/05/2016  . Failed total left knee replacement ([place]) 08/02/2015  . Sigmoid diverticulitis 11/24/2014  . Acute blood loss anemia 06/13/2014  . Neuropathic pain 06/13/2014  . Insomnia 06/13/2014  . Anxiety and depression 06/13/2014  . Cancer of ascending colon ([place]) 06/13/2014  . Small bowel obstruction ([place]) 05/31/2014  . Colovaginal fistula 04/20/2014  . Lethargy 09/15/2013  . Chest pain, musculoskeletal 02/19/2011  . Depression 02/07/2010  . DIASTOLIC HEART FAILURE, CHRONIC 02/27/2009  . PURE HYPERCHOLESTEROLEMIA 04/05/2008  . Coronary atherosclerosis 04/05/2008  . Osteoarthritis 05/19/2007  . Essential hypertension 01/21/2007  . GERD 01/21/2007  . Hiatal hernia 01/21/2007  . Diverticulosis of large intestine 01/21/2007  . COLONIC POLYPS, HX OF 01/21/2007    Past Surgical History:  Procedure Laterality Date  . ABDOMINAL HYSTERECTOMY    . ANTERIOR CERVICAL DECOMP/DISCECTOMY FUSION  01-31-2009   C5 -- 7  . APPENDECTOMY    . Bone spurs Bilateral    Feet  . CARDIOVASCULAR STRESS TEST  02-26-2011   Normal lexiscan no exercise study/  no ischemia/  normal LV function and wall motion, ef 67%  . CARPAL TUNNEL RELEASE Right   . CATARACT EXTRACTION W/ INTRAOCULAR LENS  IMPLANT, BILATERAL Bilateral   . CHOLECYSTECTOMY    . COLOSTOMY N/A 06/02/2014   Procedure: DIVERTING DESCENDING END COLOSTOMY;  Surgeon: Donnie Mesa, MD;  Location: [place];  Service: General;  Laterality: N/A;  . CORONARY ANGIOPLASTY WITH STENT PLACEMENT  11-04-2007  dr bensimhon   BMS x2 to RCA/  mild Non-obstructive disease LAD/  normal LVF  . DILATION AND CURETTAGE OF UTERUS    .  ESOPHAGOGASTRODUODENOSCOPY N/A 10/28/2017   Procedure: ESOPHAGOGASTRODUODENOSCOPY (EGD);  Surgeon: Yetta Flock, MD;  Location: [facility] ENDOSCOPY;  Service: Gastroenterology;  Laterality: N/A;  . EVALUATION UNDER ANESTHESIA WITH ANAL FISTULECTOMY N/A 10/13/2014   Procedure: ANAL EXAM UNDER ANESTHESIA ;  Surgeon: [name], MD;  Location: [facility];  Service: General;  Laterality: N/A;  . HAND SURGERY     Tendon repair  . LAPAROSCOPIC SIGMOID COLECTOMY N/A 11/24/2014   Procedure: SIGMOID COLECTOMY AND COLSOTOMY CLOSURE;  Surgeon: Donnie Mesa, MD;  Location: [place];  Service: General;  Laterality: N/A;  . LUMBAR LAMINECTOMY  10-29-2002,  1969   Left  L3 -- 4  decompression  . PARTIAL COLECTOMY N/A 06/02/2014   Procedure: RIGHT PARTIAL COLECTOMY ;  Surgeon: Donnie Mesa, MD;  Location: [place];  Service: General;  Laterality: N/A;  . PATELLECTOMY Right 09/14/2013  Procedure: PATELLECTOMY;  Surgeon: Meredith Pel, MD;  Location: [place];  Service: Orthopedics;  Laterality: Right;  . PROCTOSCOPY N/A 10/13/2014   Procedure: RIDGE PROCTOSCOPY;  Surgeon: [name], MD;  Location: [facility];  Service: General;  Laterality: N/A;  . REVISION TOTAL KNEE ARTHROPLASTY Bilateral right  04-02-2011/  left 1996 & 10-05-1999  . ROTATOR CUFF REPAIR Bilateral   . TONSILLECTOMY    . TOTAL KNEE ARTHROPLASTY Bilateral left 1994/  right 2000  . TOTAL KNEE REVISION Left 08/02/2015   Procedure: LEFT FEMORAL REVISION;  Surgeon: Gaynelle Arabian, MD;  Location: [facility];  Service: Orthopedics;  Laterality: Left;  . TRANSTHORACIC ECHOCARDIOGRAM  12-01-2009   Grade I diastolic dysfunction/  ef 60%/  moderate MR/  mild TR     OB History   No obstetric history on file.     Family History  Problem Relation Age of Onset  . Osteoarthritis Mother   . Heart failure Mother   . Colon cancer Maternal Uncle 45  . Heart Problems Father   . Cancer Sister 73       ? colon cancer   . Kidney cancer Brother 55  . Esophageal  cancer Neg Hx   . Rectal cancer Neg Hx   . Stomach cancer Neg Hx   . Pancreatic cancer Neg Hx     Social History   Tobacco Use  . Smoking status: Former Smoker    Packs/day: 0.50    Years: 16.00    Pack years: 8.00    Types: Cigarettes    Start date: 53    Quit date: 07/16/1967    Years since quitting: 52.0  . Smokeless tobacco: Never Used  Substance Use Topics  . Alcohol use: No    Alcohol/week: 0.0 standard drinks  . Drug use: No    Home Medications Prior to Admission medications   Medication Sig Start Date End Date Taking? Authorizing Provider  acetaminophen (TYLENOL) 325 MG tablet Take 650 mg by mouth every 6 (six) hours as needed for moderate pain.     [provider]  albuterol (PROAIR HFA) 108 (90 Base) MCG/ACT inhaler Inhale 2 puffs into the lungs every 6 (six) hours as needed for wheezing or shortness of breath. 04/05/19   [name], MD  allopurinol (ZYLOPRIM) 100 MG tablet Take 100 mg by mouth daily.    [provider]  apixaban (ELIQUIS) 5 MG TABS tablet Take 1 tablet (5 mg total) by mouth 2 (two) times daily. 04/06/19   Nafziger, [name], NP  Ascorbic Acid (VITAMIN C) 1000 MG tablet Take 1,000 mg by mouth daily.    [provider]  benazepril (LOTENSIN) 40 MG tablet Take 1 tablet (40 mg total) by mouth daily. 04/06/19   Nafziger, [name], NP  colchicine 0.6 MG tablet Take 0.6 mg by mouth daily.    [provider]  diltiazem (CARDIZEM CD) 120 MG 24 hr capsule Take 1 capsule by mouth as needed when Heart Rate is above 008 and Systolic Blood Pressure is above 100 mm/hg Patient taking differently: Take 120 mg by mouth daily.  06/09/19   [name], Wonda Cheng, MD  doxycycline (VIBRAMYCIN) 100 MG capsule Take 1 capsule (100 mg total) by mouth 2 (two) times daily for 7 days. 07/21/19 07/28/19  Suzy Bouchard, PA-C  esomeprazole (NEXIUM) 40 MG capsule Take 1 capsule (40 mg total) by mouth daily. 04/06/19   Nafziger, [name], NP  ferrous sulfate  325 (65 FE) MG EC tablet Take  325 mg by mouth daily with breakfast.    [provider]  fluticasone (FLOVENT HFA) 44 MCG/ACT inhaler Inhale 2 puffs into the lungs 2 (two) times daily. 03/27/18   Lucretia Kern, DO  furosemide (LASIX) 20 MG tablet Take 2 tablets (40 mg total) by mouth daily. Please keep upcoming appt in November with Dr. Acie Fredrickson before anymore refills. Thank you 06/09/19   [name], Wonda Cheng, MD  gabapentin (NEURONTIN) 600 MG tablet Take 1 tablet (600 mg total) by mouth 2 (two) times daily. 04/06/19   Nafziger, [name], NP  HYDROcodone-acetaminophen (NORCO/VICODIN) 5-325 MG tablet Take 1 tablet by mouth every 6 (six) hours as needed for severe pain. 07/21/19   Suzy Bouchard, PA-C  LORazepam (ATIVAN) 0.5 MG tablet 1 tablet twice daily as needed for anxiety or sleep Patient taking differently: Take 0.5 mg by mouth 2 (two) times daily as needed for anxiety or sleep.  09/09/18   Nafziger, [name], NP  metoprolol tartrate (LOPRESSOR) 50 MG tablet Take 1.5 tablets (75 mg total) by mouth 2 (two) times daily. 04/06/19 07/10/19  Nafziger, [name], NP  nitroGLYCERIN (NITROSTAT) 0.4 MG SL tablet Place 1 tablet (0.4 mg total) under the tongue every 5 (five) minutes as needed for chest pain (x 3 pills). Reported on 09/01/2015 04/06/19   [name], NP  sertraline (ZOLOFT) 50 MG tablet Take 1 tablet (50 mg total) by mouth daily. 04/06/19   [name], NP  Spacer/Aero-Holding Chambers (E-Z SPACER) inhaler Use as instructed 03/20/18   [name], MD  Tiotropium Bromide Monohydrate (SPIRIVA RESPIMAT) 2.5 MCG/ACT AERS Inhale 2 puffs into the lungs daily. 04/05/19   [name], MD  traZODone (DESYREL) 100 MG tablet TAKE 1/2 (ONE-HALF) TABLET BY MOUTH AT BEDTIME Patient taking differently: Take 50 mg by mouth at bedtime.  03/11/19   Nafziger, [name], NP  triamcinolone cream (KENALOG) 0.1 % Apply 1 application topically 2 (two) times daily as needed (Eczema on legs). 06/08/19   [name], NP     Allergies    Patient has no known allergies.  Review of Systems   Review of Systems  Constitutional: Negative for chills and fever.  Respiratory: Positive for shortness of breath (chronic).   Cardiovascular: Positive for leg swelling. Negative for chest pain.  Gastrointestinal: Positive for abdominal pain (chronic hx colon cancer). Negative for diarrhea, nausea and vomiting.  Musculoskeletal: Positive for arthralgias, gait problem and joint swelling.  Skin: Positive for color change and wound.  Neurological: Positive for weakness (subjective left upper extremity) and light-headedness. Negative for dizziness, syncope, facial asymmetry and speech difficulty.  All other systems reviewed and are negative.   Physical Exam Updated Vital Signs BP 130/82 (BP Location: Left Arm)   Pulse 90   Temp 98.6 F (37 C) (Oral)   Resp 19   SpO2 97%   Physical Exam Vitals and nursing note reviewed.  Constitutional:      General: She is not in acute distress.    Appearance: She is not toxic-appearing.  HENT:     Head: Normocephalic.  Eyes:     Pupils: Pupils are equal, round, and reactive to light.  Neck:     Comments: Mild cervical midline tenderness. No crepitus, deformity, or step-off. Full ROM of neck. Cardiovascular:     Rate and Rhythm: Normal rate and regular rhythm.     Pulses: Normal pulses.     Heart sounds: Normal heart sounds. No murmur. No friction rub. No gallop.   Pulmonary:  Effort: Pulmonary effort is normal.     Breath sounds: Normal breath sounds.  Abdominal:     General: Abdomen is flat. Bowel sounds are normal. There is no distension.     Palpations: Abdomen is soft.     Tenderness: There is abdominal tenderness. There is no guarding or rebound.     Comments: Diffuse tenderness throughout. No focal tenderness. Numerous areas of ecchymosis over abdomen, most significant in LUQ. No rebound. No guarding.   Musculoskeletal:     Cervical back: Neck supple.      Right lower leg: Edema present.     Comments: 2+ pitting edema of right lower extremity from knee down to ankle with surrounding ecchymosis and erythema on medial aspect of shin. Distal pulses and sensation intact. Full ROM of knee, ankle, and toes. Soft compartments.  Skin:    Findings: Bruising and erythema present.     Comments: Right lower leg with ecchymosis and erythema in medial aspect of shin into dorsal aspect of foot and toes. Lower shin warm to touch. See photo below  Neurological:     General: No focal deficit present.     Mental Status: She is alert.     Comments: Speech is clear, able to follow commands CN III-XII intact Normal strength in upper and lower extremities bilaterally including dorsiflexion and plantar flexion, strong and equal grip strength Sensation grossly intact throughout with slight decrease in left face and left upper extremity Moves extremities without ataxia, coordination intact No pronator drift          ED Results / Procedures / Treatments   Labs (all labs ordered are listed, but only abnormal results are displayed) Labs Reviewed  CBC WITH DIFFERENTIAL/PLATELET - Abnormal; Notable for the following components:      Result Value   RBC 3.66 (*)    Hemoglobin 11.3 (*)    All other components within normal limits  COMPREHENSIVE METABOLIC PANEL - Abnormal; Notable for the following components:   Potassium 5.2 (*)    Glucose, Bld 135 (*)    BUN 46 (*)    Creatinine, Ser 1.52 (*)    Calcium 8.7 (*)    GFR calc non Af Amer 31 (*)    GFR calc Af Amer 35 (*)    All other components within normal limits  BRAIN NATRIURETIC PEPTIDE - Abnormal; Notable for the following components:   B Natriuretic Peptide 354.5 (*)    All other components within normal limits  PROTIME-INR - Abnormal; Notable for the following components:   Prothrombin Time 17.3 (*)    INR 1.4 (*)    All other components within normal limits  TROPONIN I (HIGH SENSITIVITY)  TROPONIN  I (HIGH SENSITIVITY)    EKG EKG Interpretation  Date/Time:  Wednesday July 21 2019 14:46:01 EST Ventricular Rate:  79 PR Interval:    QRS Duration: 76 QT Interval:  368 QTC Calculation: 421 R Axis:   102 Text Interpretation: Atrial fibrillation Anterolateral infarct , age undetermined Abnormal ECG No significant change since last tracing Confirmed by Wandra Arthurs [phone]) on 07/21/2019 3:02:01 PM   Radiology DG Chest 2 View  Result Date: 07/21/2019 CLINICAL DATA:  Chest pain EXAM: CHEST - 2 VIEW COMPARISON:  07/10/2019 FINDINGS: Cardiac enlargement without heart failure. Lungs are clear without infiltrate or effusion. No change from the prior study ACDF cervical spine. Widening of the [facility] joint bilaterally likely postoperative. IMPRESSION: No active cardiopulmonary disease. Electronically Signed   By: Franchot Gallo  M.D.   On: 07/21/2019 15:52   DG Tibia/Fibula Right  Result Date: 07/21/2019 CLINICAL DATA:  Injury, leg pain EXAM: RIGHT TIBIA AND FIBULA - 2 VIEW COMPARISON:  None. FINDINGS: Negative for acute fracture. Total knee replacement without complication. Arterial calcification. IMPRESSION: Negative for acute fracture. Electronically Signed   By: Franchot Gallo M.D.   On: 07/21/2019 15:53   CT Head Wo Contrast  Result Date: 07/21/2019 CLINICAL DATA:  Left arm numbness and tingling. EXAM: CT HEAD WITHOUT CONTRAST TECHNIQUE: Contiguous axial images were obtained from the base of the skull through the vertex without intravenous contrast. COMPARISON:  October 26, 2016 FINDINGS: Brain: There is mild cerebral atrophy with widening of the extra-axial spaces and ventricular dilatation. There are areas of decreased attenuation within the white matter tracts of the supratentorial brain, consistent with microvascular disease changes. There is a small chronic right para thalamic lacunar infarct. Vascular: No hyperdense vessel or unexpected calcification. Skull: Normal. Negative for fracture or focal  lesion. Sinuses/Orbits: No acute finding. Other: None. IMPRESSION: 1. No acute intracranial abnormality. 2. Mild cerebral atrophy and microvascular disease changes of the supratentorial brain. 3. Small chronic right para thalamic lacunar infarct. Electronically Signed   By: Virgina Norfolk M.D.   On: 07/21/2019 15:56   CT Cervical Spine Wo Contrast  Result Date: 07/21/2019 CLINICAL DATA:  Left arm numbness and tingling. EXAM: CT CERVICAL SPINE WITHOUT CONTRAST TECHNIQUE: Multidetector CT imaging of the cervical spine was performed without intravenous contrast. Multiplanar CT image reconstructions were also generated. COMPARISON:  None. FINDINGS: Alignment: There is approximately 2 mm anterolisthesis of the C7 on T1 vertebral body. Skull base and vertebrae: A metallic density fusion plate and screws are seen along the anterior aspects of the C5, C6 and C7 vertebral bodies. No acute fracture. No primary bone lesion or focal pathologic process. Soft tissues and spinal canal: No prevertebral fluid or swelling. No visible canal hematoma. Disc levels: C2-3: There is mild end plate spondylosis. Mild disc space narrowing is seen. Bilateral facet hypertrophy is noted. Normal central canal and intervertebral neuroforamina. C3-4: There is mild to moderate severity end plate spondylosis. Moderate severity disc space narrowing is seen. Bilateral facet hypertrophy is noted. Normal central canal and intervertebral neuroforamina. C4-5: There is mild end plate spondylosis. Moderate severity disc space narrowing is seen. Bilateral facet hypertrophy is noted. Normal central canal and intervertebral neuroforamina. C5-6: There is moderate severity end plate spondylosis. Hyperdense operative material is seen within the intervertebral disc space. Bilateral facet hypertrophy is noted. Normal central canal and intervertebral neuroforamina. C6-7: There is moderate severity end plate spondylosis. Hyperdense operative material is seen  within the intervertebral disc space. Bilateral facet hypertrophy is noted. Normal central canal and intervertebral neuroforamina. C7-T1: There is mild end plate spondylosis. Mild to moderate severity disc space narrowing is seen. Bilateral facet hypertrophy is noted. Normal central canal and intervertebral neuroforamina. Upper chest: Negative. Other: None. IMPRESSION: 1. Multilevel degenerative disc and facet disease. 2. Postoperative changes at the levels of C5-C6 and C6-C7. 3. There is approximately 2 mm anterolisthesis of C7 on T1 vertebral body. Electronically Signed   By: Virgina Norfolk M.D.   On: 07/21/2019 16:00   CT ABDOMEN PELVIS W CONTRAST  Result Date: 07/21/2019 CLINICAL DATA:  Abdominal pain. EXAM: CT ABDOMEN AND PELVIS WITH CONTRAST TECHNIQUE: Multidetector CT imaging of the abdomen and pelvis was performed using the standard protocol following bolus administration of intravenous contrast. CONTRAST:  195m OMNIPAQUE IOHEXOL 300 MG/ML  SOLN COMPARISON:  October 27, 2017 FINDINGS: Lower chest: No acute abnormality. Hepatobiliary: No focal liver abnormality is seen. Status post cholecystectomy. No biliary dilatation. Pancreas: Spleen: Adrenals/Urinary Tract: Adrenal glands are unremarkable. Kidneys are normal in size, without renal calculi or hydronephrosis. Subcentimeter simple cysts are seen within both kidneys. Bladder is unremarkable. Stomach/Bowel: There is a large gastric hernia. The appendix is not identified. Surgical sutures are seen along the cecum and within the expected region of the distal sigmoid colon. No evidence of bowel wall thickening, distention, or inflammatory changes. Vascular/Lymphatic: Moderate severity aortic atherosclerosis. No enlarged abdominal or pelvic lymph nodes. Reproductive: Status post hysterectomy. No adnexal masses. Other: No abdominal wall hernia or abnormality. No abdominopelvic ascites. Musculoskeletal: Multilevel degenerative changes seen throughout the  lumbar spine IMPRESSION: 1. Large gastric hernia. 2. Status post cholecystectomy and hysterectomy. 3. Moderate severity aortic atherosclerosis. 4. Multilevel degenerative changes throughout the lumbar spine. Aortic Atherosclerosis (ICD10-I70.0). Electronically Signed   By: Virgina Norfolk M.D.   On: 07/21/2019 18:23   US Venous Img Lower Unilateral Right  Result Date: 07/21/2019 CLINICAL DATA:  86 year old female with a history of right leg swelling EXAM: RIGHT LOWER EXTREMITY VENOUS DOPPLER ULTRASOUND TECHNIQUE: Gray-scale sonography with graded compression, as well as color Doppler and duplex ultrasound were performed to evaluate the lower extremity deep venous systems from the level of the common femoral vein and including the common femoral, femoral, profunda femoral, popliteal and calf veins including the posterior tibial, peroneal and gastrocnemius veins when visible. The superficial great saphenous vein was also interrogated. Spectral Doppler was utilized to evaluate flow at rest and with distal augmentation maneuvers in the common femoral, femoral and popliteal veins. COMPARISON:  None. FINDINGS: Contralateral Common Femoral Vein: Respiratory phasicity is normal and symmetric with the symptomatic side. No evidence of thrombus. Normal compressibility. Common Femoral Vein: No evidence of thrombus. Normal compressibility, respiratory phasicity and response to augmentation. Saphenofemoral Junction: No evidence of thrombus. Normal compressibility and flow on color Doppler imaging. Profunda Femoral Vein: No evidence of thrombus. Normal compressibility and flow on color Doppler imaging. Femoral Vein: No evidence of thrombus. Normal compressibility, respiratory phasicity and response to augmentation. Popliteal Vein: No evidence of thrombus. Normal compressibility, respiratory phasicity and response to augmentation. Calf Veins: Calf veins not well visualized Superficial Great Saphenous Vein: No evidence of  thrombus. Normal compressibility and flow on color Doppler imaging. Other Findings: There are 2 separate heterogeneously hypoechoic fluid collection/soft tissue in the right calf. One measures 3.4 cm x 1.6 cm x 2.9 cm. One measures 3.5 cm x 1.1 cm x 3.6 cm. IMPRESSION: Sonographic survey of the right lower extremity negative for DVT. There are 2 focal regions of soft tissue/fluid in the right calf measuring 3.4 cm and 3.6 cm, respectively. Nonspecific ultrasound findings, with the differential including hematoma/seroma or other soft tissue lesions. Correlation with the patient's presentation [place] be useful. Electronically Signed   By: Corrie Mckusick D.O.   On: 07/21/2019 15:39   DG Foot Complete Right  Result Date: 07/21/2019 CLINICAL DATA:  Blunt trauma to foot several days ago with persistent pain and swelling, initial encounter EXAM: RIGHT FOOT COMPLETE - 3+ VIEW COMPARISON:  None. FINDINGS: No acute fracture or dislocation is noted. Mild soft tissue swelling is seen. Degenerative changes of the tarsal bones are seen. Healed trauma in the second proximal phalanx is noted. IMPRESSION: Soft tissue swelling without acute bony abnormality. Electronically Signed   By: Inez Catalina M.D.   On: 07/21/2019 15:51    Procedures Procedures (  including critical care time)  Medications Ordered in ED Medications  HYDROcodone-acetaminophen (NORCO/VICODIN) 5-325 MG per tablet 1 tablet (1 tablet Oral Given 07/21/19 1612)  iohexol (OMNIPAQUE) 300 MG/ML solution 100 mL (100 mLs Intravenous Contrast Given 07/21/19 1816)  HYDROcodone-acetaminophen (NORCO/VICODIN) 5-325 MG per tablet 1 tablet (1 tablet Oral Given 07/21/19 1932)    ED Course  I have reviewed the triage vital signs and the nursing notes.  Pertinent labs & imaging results that were available during my care of the patient were reviewed by me and considered in my medical decision making (see chart for details).    MDM Rules/Calculators/A&P                       86 year old female presents to the ED due to right leg pain after a drawer fell on it on 07/10/2019.  Patient also notes left arm numbness and tingling from her shoulder down to her first 3 fingers for the past few days. Stable vitals. Patient in no acute distress and non-toxic appearing. Right leg with areas of warmth, erythema, and ecchymosis on medial aspect of shin. Full ROM of knee, ankle, and toes. Neurovascularly intact. Soft compartments, doubt compartment syndrome. 2+ pitting edema of right leg. No edema on left leg. Abdomen soft, non-distended with diffuse tenderness throughout and ecchymosis with greatest area in LUQ. No peritoneal signs. Doubt acute abdomen at this time, but given unexplained ecchymosis, especially over spleen, will obtain CT scan to rule out splenic injury. Spoke to daughter [name] on the phone and she believes bruising could be from where the drawer fell on her abdomen. Patient is on Eliquis which would explain easy bruising. Discussed case with Dr. Darl Householder just prior to his shift change who felt a thorough workup was needed given left sided numbness/tingling of arm and extensive past medical history.   BNP elevated at 354.5. CXR personally reviewed which is negative for signs of infection or fluid overload. Given patient is not complaining of increased shortness of breath, do not feel patient is having a CHF exacerbation at this time. Patient does not appear fluid overloaded on physical exam. Right leg with 2+ pitting edema, but probably due to right leg injury. Left leg normal with no edema. Right leg x-rays personally reviewed which are negative for bony fractures. Right [place] negative for signs of DVT. CT head reviewed which is negative for acute abnormalities. CT cervical spine personally reviewed which demonstrated: 1. Multilevel degenerative disc and facet disease.  2. Postoperative changes at the levels of C5-C6 and C6-C7.  3. There is approximately 2 mm anterolisthesis of C7 on  T1 vertebral  body.   Suspect numbness/tingling could be due to chronic neck issues. Advised patient to follow-up with neck surgeon for further evaluation of neck and numbness/tingling.  CT abdomen personally reviewed which is negative for any acute abnormalities. No sign of splenic injury. EKG reviewed which is negative for any changes from prior EKGs. CBC reassuring with no leukocytosis. Serial troponin negative. Doubt ACS at this time. CMP significant for increased creatinine at 1.52 and increased BUN at 46 which appears to be around patient's baseline.  Given right leg has no bony fractures and no DVT, will treat for cellulitis with doxycycline due to warmth and erythema. Discussed case with new attending Dr. Tamera Punt who evaluated patient and agrees with assessment and plan. Patient advised to follow-up with PCP within the next week for further evaluation of right leg. Strict ED precautions discussed with patient.  Patient states understanding and agrees to plan. Patient discharged home in no acute distress and stable vitals      Final Clinical Impression(s) / ED Diagnoses Final diagnoses:  Cellulitis of right lower extremity  Numbness and tingling in left arm    Rx / DC Orders ED Discharge Orders         Ordered    doxycycline (VIBRAMYCIN) 100 MG capsule  2 times daily     07/21/19 1917    HYDROcodone-acetaminophen (NORCO/VICODIN) 5-325 MG tablet  Every 6 hours PRN     07/21/19 1953           Karie Kirks 07/22/19 0941    Malvin Johns, MD 08/04/19 1114

## 2019-07-21 NOTE — ED Notes (Signed)
Patient transported to [facility] 

## 2019-07-22 ENCOUNTER — Telehealth: Payer: Self-pay | Admitting: Hematology

## 2019-07-22 ENCOUNTER — Telehealth: Payer: Self-pay | Admitting: *Deleted

## 2019-07-22 NOTE — Telephone Encounter (Signed)
Copied from [place] [id number]. Topic: General - Other >> Jul 22, 2019  8:36 AM Celene Kras wrote: Reason for CRM: Pts daughter called stating she is needing to speak with PCP regarding pts ER visit yesterday. She states it is very important. Please advise.

## 2019-07-22 NOTE — Telephone Encounter (Signed)
Spoke to pt daughter [name] and she stated that some days ago pt dresser drawer fell on her leg. [name] claimed that pt went to the pain doctor 2 days later and was told pt would be fine. [name] stated that the leg was swollen some days later " size of a golf ball". Triage nurse advise that pt will need to go to the ED. [name] took triage nurse advise. [name] stated that pt was given 7 days of abx. Pt and [name] stated that they do not know anything that was going no information was given to the pt or daughter. [name] articulated that the nurse that brought her mom out had no knowledge of what was going on bc she had just started her shift. [name] is slightly upset and stated  "bunch of labs and imaging was done when all I wanted was for them to look at my moms leg". [name] claims she is so confused about what is going on and wants to see if [name] can look at the notes to advise.

## 2019-07-22 NOTE — Telephone Encounter (Signed)
No los per 1/6. 

## 2019-07-23 ENCOUNTER — Encounter: Payer: Self-pay | Admitting: Hematology

## 2019-07-26 ENCOUNTER — Telehealth: Payer: Self-pay | Admitting: Adult Health

## 2019-07-27 ENCOUNTER — Telehealth: Payer: Self-pay | Admitting: *Deleted

## 2019-07-27 NOTE — Telephone Encounter (Signed)
Copied from [place]. Topic: General - Inquiry >> Jul 26, 2019  6:45 PM Alease Frame wrote: Reason for CRM: Denman George from [employer] needing a call back from Dr [name] regarding patients medication  Call back [phone]

## 2019-07-28 ENCOUNTER — Other Ambulatory Visit: Payer: Self-pay

## 2019-07-28 ENCOUNTER — Encounter: Payer: Self-pay | Admitting: Family Medicine

## 2019-07-28 ENCOUNTER — Ambulatory Visit (INDEPENDENT_AMBULATORY_CARE_PROVIDER_SITE_OTHER): Payer: HMO | Admitting: Family Medicine

## 2019-07-28 ENCOUNTER — Telehealth: Payer: Self-pay

## 2019-07-28 VITALS — BP 118/66 | HR 58 | Temp 97.3°F | Ht 63.5 in | Wt 192.0 lb

## 2019-07-28 DIAGNOSIS — R600 Localized edema: Secondary | ICD-10-CM

## 2019-07-28 DIAGNOSIS — L03115 Cellulitis of right lower limb: Secondary | ICD-10-CM

## 2019-07-28 MED ORDER — CEPHALEXIN 500 MG PO CAPS: 500.0000 mg | ORAL_CAPSULE | Freq: Four times a day (QID) | ORAL | 0 refills | Status: DC

## 2019-07-28 MED ORDER — HYDROCODONE-ACETAMINOPHEN 5-325 MG PO TABS: 1.0000 | ORAL_TABLET | Freq: Four times a day (QID) | ORAL | 0 refills | Status: DC | PRN

## 2019-07-28 NOTE — Telephone Encounter (Signed)
-----   Message from [name], MD sent at 07/27/2019 12:36 PM EST ----- Krista Blue, Had complete colonoscopy just 2.5 years ago. Known moderate sized internal hemorrhoids--which is likely the cause. Happy to see. Barbette Merino, Routine OV with me or APP. Thanks  JP ----- Message ----- From: Truitt Merle, MD Sent: 07/27/2019  10:02 AM EST To: [name], MD  Dr. Henrene Pastor,  I saw her last week. She has had intermittent rectal bleeding lately, was seen in the ED last months.  Since it has been 5 years since her initial colon cancer diagnosis, my suspicion for recurrent colon cancer is low. But she Mckissic need repeated colonoscopy or sigmoidoscopy. Could you see her back?   Thanks  Krista Blue

## 2019-07-28 NOTE — Patient Outreach (Signed)
[place] [facility]) Care Management Chronic Special Needs Program  07/28/2019  Name: [name] DOB: 1929-07-18  MRN: [id number]  [name] is enrolled in a chronic special needs plan for Heart Failure. Chronic Care Management Coordinator telephoned client to review health risk assessment and to update individualized care plan.  Reviewed the chronic care management program, importance of client participation, and taking their care plan to all provider appointments and inpatient facilities.  Reviewed the transition of care process and possible referral to community care management.  Subjective:Client states that the swelling in her rt leg has not improved and she is taking her last antibiotic today.  States she has an appointment to see her provider to check her leg.  Denies any open areas on her leg.  States her last fall was several months ago when she fell reaching for her alert button.  States she was charging her button and was getting it to put back on.  States she uses her walker at all times,  States she takes her medications everyday as ordered and she can afford them with the pharmacy assistance help she gets.  States her daughter helps her as needed.  Client states she still does her own cooking, laundry and most of her house cleaning.  States she paces herself.  States she does get short of breath if she does much at a time.  States she does not weigh everyday but does weigh at least 2-3 times a week.  States she knows if her weight goes up to call her doctor.  States she saw her cardiologist in November.  States she follows a low sodium diet and she does not use processed foods if possible.  States her B/P is usually is around 120/80 when she checks it.  Goals Addressed            This Visit's Progress   . Client understands the importance of follow-up with providers by attending scheduled visits   On track    Keeps scheduled appointments    . Client will not report  change from baseline and no repeated symptoms of stroke with in the next 6 months (continue 07/28/19)   On track    No change from baseline    . COMPLETED: Client will report abillity to obtain Medications within 3 months   On track    Approved for pharmacy assistance    . Client will report no fall or injuries in the next 6 months.(continue 07/28/19)   Not on track    Reports one fall no injury Stand up slowly Use cane or walker at all times Use grabber to pick up objects on the floor Keep home well-lit and wear your glasses    . Client will report no worsening of symptoms of Atrial Fibrillation within the next 6 months(continue 07/28/19)   On track    No worsening of symptoms and taking anticoagulant as directed    . Client will report no worsening of symptoms related to heart disease within the next 6 months(continue 07/28/19)   On track    No worsening of heart disease symptoms    . Client will verbalize knowledge of chronic lung disease as evidenced by no ED visits or Inpatient stays related to chronic lung disease    On track    No ED or hospitalization for lung disease    . Client will verbalize knowledge of self management of Hypertension as evidences by BP reading of 140/90 or  less; or as defined by provider   On track    Reports B/P less than 140/90 when checking     . Client will verbalize understanding of treatment plan for impaired skin integrity(rt leg cellulitis) and follow up with provider by next 6 months       Keep legs elevated Keep follow up appointment with provider Call provider for increased swelling, redness or open skin    . Decrease inpatient Heart Failure admissions/ readmissions with in the year   On track   . Decrease the use of hospital emergency department related to heart failure within the next year   On track   . Maintain timely refills of Heart Failure medication as prescribed within the year    On track    Maintains refills of medications    . Obtain  annual  Lipid Profile, LDL-C   Not on track    No recent lipid profile    . Visit Primary Care Provider or Cardiologist at least 2 times per year   On track    Client verbalizes good teach back on s/s of HF to call MD Client not weighting daily and does not wish to make goal to weigh daily as she is weighing 2-3 times a week Client currently receiving pharmacy assistance for medications and plan to refer to pharmacy to continue assistance and to review medications for polypharmacy Reviewed s/s of HF to call MD  Instructed on s/s of worsening of her leg cellulitis and when to call MD.  Encouraged to continue to keep legs elevated Reinforced to try to weight daily and to record her weights Reviewed following a low sodium diet  Reviewed home safety and fall precautions.   Reviewed number for 24-hour nurse Line Reviewed COVID 19 precautions  Plan:  Send successful outreach letter with a copy of their individualized care plan, Send individual care plan to provider and Send educational material [place] calendar  Chronic care management coordination will outreach in:  6 Months  Will refer client to:  Pharmacy to continue pharmacy assistance and review medications for polypharmacy   Peter Garter RN, [facility], [place] Management [phone]

## 2019-07-28 NOTE — Telephone Encounter (Signed)
Called number several times but no answer.

## 2019-07-28 NOTE — Patient Instructions (Signed)
Cellulitis, Adult  Cellulitis is a skin infection. The infected area is usually warm, red, swollen, and tender. This condition occurs most often in the arms and lower legs. The infection can travel to the muscles, blood, and underlying tissue and become serious. It is very important to get treated for this condition. What are the causes? Cellulitis is caused by bacteria. The bacteria enter through a break in the skin, such as a cut, burn, insect bite, open sore, or crack. What increases the risk? This condition is more likely to occur in people who:  Have a weak body defense system (immune system).  Have open wounds on the skin, such as cuts, burns, bites, and scrapes. Bacteria can enter the body through these open wounds.  Are older than 84 years of age.  Have diabetes.  Have a type of long-lasting (chronic) liver disease (cirrhosis) or kidney disease.  Are obese.  Have a skin condition such as: ? Itchy rash (eczema). ? Slow movement of blood in the veins (venous stasis). ? Fluid buildup below the skin (edema).  Have had radiation therapy.  Use IV drugs. What are the signs or symptoms? Symptoms of this condition include:  Redness, streaking, or spotting on the skin.  Swollen area of the skin.  Tenderness or pain when an area of the skin is touched.  Warm skin.  A fever.  Chills.  Blisters. How is this diagnosed? This condition is diagnosed based on a medical history and physical exam. You Hashimi also have tests, including:  Blood tests.  Imaging tests. How is this treated? Treatment for this condition [place] include:  Medicines, such as antibiotic medicines or medicines to treat allergies (antihistamines).  Supportive care, such as rest and application of cold or warm cloths (compresses) to the skin.  Hospital care, if the condition is severe. The infection usually starts to get better within 1-2 days of treatment. Follow these instructions at  home:  Medicines  Take over-the-counter and prescription medicines only as told by your health care provider.  If you were prescribed an antibiotic medicine, take it as told by your health care provider. Do not stop taking the antibiotic even if you start to feel better. General instructions  Drink enough fluid to keep your urine pale yellow.  Do not touch or rub the infected area.  Raise (elevate) the infected area above the level of your heart while you are sitting or lying down.  Apply warm or cold compresses to the affected area as told by your health care provider.  Keep all follow-up visits as told by your health care provider. This is important. These visits let your health care provider make sure a more serious infection is not developing. Contact a health care provider if:  You have a fever.  Your symptoms do not begin to improve within 1-2 days of starting treatment.  Your bone or joint underneath the infected area becomes painful after the skin has healed.  Your infection returns in the same area or another area.  You notice a swollen bump in the infected area.  You develop new symptoms.  You have a general ill feeling (malaise) with muscle aches and pains. Get help right away if:  Your symptoms get worse.  You feel very sleepy.  You develop vomiting or diarrhea that persists.  You notice red streaks coming from the infected area.  Your red area gets larger or turns dark in color. These symptoms [place] represent a serious problem that is an   emergency. Do not wait to see if the symptoms will go away. Get medical help right away. Call your local emergency services (911 in the [place] Do not drive yourself to the hospital. Summary  Cellulitis is a skin infection. This condition occurs most often in the arms and lower legs.  Treatment for this condition [place] include medicines, such as antibiotic medicines or antihistamines.  Take over-the-counter and prescription  medicines only as told by your health care provider. If you were prescribed an antibiotic medicine, do not stop taking the antibiotic even if you start to feel better.  Contact a health care provider if your symptoms do not begin to improve within 1-2 days of starting treatment or your symptoms get worse.  Keep all follow-up visits as told by your health care provider. This is important. These visits let your health care provider make sure that a more serious infection is not developing. This information is not intended to replace advice given to you by your health care provider. Make sure you discuss any questions you have with your health care provider. Document Revised: 11/20/2017 Document Reviewed: 11/20/2017 Elsevier Patient Education  2020 [employer]  

## 2019-07-28 NOTE — Telephone Encounter (Signed)
Spoke with pts daughter, Pt scheduled to see Dr. Henrene Pastor 07/30/19@10 :30am. Daughter aware of appt.

## 2019-07-28 NOTE — Progress Notes (Signed)
Subjective:     Patient ID: [name], female   DOB: [birth year], 86 y.o.   MRN: [id number]  HPI   [name] is seen with progressive redness and swelling of her right lower extremity.  She had injury to the leg on December 26.  She was pulling out a drawer which hit her right leg with mild abrasion to the skin.  Fortunately, she has no open wounds.  After the initial injury on the 26th she developed some progressive swelling.  She had some significant bruising following the injury going all the way down to the foot.  She had increasing pain.  On visit to the ER she also apparently complained of some tingling and intermittent numbness left arm and also some chronic neck pain.  She ended up with extensive work-up with multiple labs, venous Dopplers, x-rays of the tib-fib and foot, chest x-ray, CT head without contrast, CT cervical spine, CT abdomen and pelvis.  Her CT abdomen pelvis was apparently related to some mild bruising on her abdomen.  She thinks that when the door hit her leg she Kobler have fallen against part of the door with her abdomen.  She is not complaining of any abdominal pain at this time.  Her white blood count in the ER was unremarkable.  We reviewed her multiple x-rays and lab results.  She also is on Eliquis regularly.  Her venous Dopplers did not show any DVT on the sixth.  Patient was placed on doxycycline and has her last pill today.  She has not seen much improvement and, in fact, worsening erythema and swelling.  No fevers or chills.  Past Medical History:  Diagnosis Date  . Adenocarcinoma, colon [facility]) Oncologist-- Dr Truitt Merle   Multifocal (2) Colon cancer @ ileocecal valve and ascending ( mT4N1Mx), Stage IIIB, grade I, MMR normal , 2 of 16 +lymph nodes, negative surgical margins---  06-02-2014  Right hemicolectomy w/ colostomy  . Allergy   . Anxiety   . Asthma    inhaler "sometimes"  . CAD (coronary artery disease)    a. LHC 4/09: pLAD 40, mDx 70-75, pLCx 30, mLCx 50, mRCA  90, EF 60% >> PCI: BMS x2 to RCA;  b. Myoview 8/12: normal  . Cataract    bil cateracts removed  . Chronic anemia   . Chronic diastolic CHF (congestive heart failure) (HCC)    a. Echo 912 - Mild LVH, EF 55-60%, no RWMA, Gr 1 DD, mild MR, mild LAE, mild RAE, PASP 59 mmHg (mod to severe pulmo HTN)  . Clotting disorder (HCC)    hx of dvt, tia on eliquis  . Colostomy in place [facility])    s/p colostomy takedown 5/16  . Colovaginal fistula    s/p colostomy >> colostomy takedown 5/16  . COPD with chronic bronchitis ([place])   . Depression   . Dysrhythmia    A fib  . Essential hypertension   . GERD (gastroesophageal reflux disease)   . History of adenomatous polyp of colon   . History of blood transfusion   . History of DVT of lower extremity   . History of hiatal hernia   . History of TIA (transient ischemic attack)    a. Head CT 6/15: small chronic lacunar infarct in thalamus  . Hyperlipidemia   . OA (osteoarthritis)   . Osteoporosis   . Peripheral neuropathy   . Pneumonia   . Pulmonary HTN ([place])    a. PASP on Echo in 9/12:  59 mmHg  . Renal insufficiency   . Sigmoid diverticulosis    s/p sigmoid colectomy  . Stroke ([place])    TIAs  . Wears dentures   . Wears glasses    Past Surgical History:  Procedure Laterality Date  . ABDOMINAL HYSTERECTOMY    . ANTERIOR CERVICAL DECOMP/DISCECTOMY FUSION  01-31-2009   C5 -- 7  . APPENDECTOMY    . Bone spurs Bilateral    Feet  . CARDIOVASCULAR STRESS TEST  02-26-2011   Normal lexiscan no exercise study/  no ischemia/  normal LV function and wall motion, ef 67%  . CARPAL TUNNEL RELEASE Right   . CATARACT EXTRACTION W/ INTRAOCULAR LENS  IMPLANT, BILATERAL Bilateral   . CHOLECYSTECTOMY    . COLOSTOMY N/A 06/02/2014   Procedure: DIVERTING DESCENDING END COLOSTOMY;  Surgeon: Donnie Mesa, MD;  Location: [place];  Service: General;  Laterality: N/A;  . CORONARY ANGIOPLASTY WITH STENT PLACEMENT  11-04-2007  dr bensimhon   BMS x2 to RCA/  mild  Non-obstructive disease LAD/  normal LVF  . DILATION AND CURETTAGE OF UTERUS    . ESOPHAGOGASTRODUODENOSCOPY N/A 10/28/2017   Procedure: ESOPHAGOGASTRODUODENOSCOPY (EGD);  Surgeon: Yetta Flock, MD;  Location: [facility] ENDOSCOPY;  Service: Gastroenterology;  Laterality: N/A;  . EVALUATION UNDER ANESTHESIA WITH ANAL FISTULECTOMY N/A 10/13/2014   Procedure: ANAL EXAM UNDER ANESTHESIA ;  Surgeon: [name], MD;  [facility];  Service: General;  Laterality: N/A;  . HAND SURGERY     Tendon repair  . LAPAROSCOPIC SIGMOID COLECTOMY N/A 11/24/2014   Procedure: SIGMOID COLECTOMY AND COLSOTOMY CLOSURE;  Surgeon: Donnie Mesa, MD;  Location: [place];  Service: General;  Laterality: N/A;  . LUMBAR LAMINECTOMY  10-29-2002,  1969   Left  L3 -- 4  decompression  . PARTIAL COLECTOMY N/A 06/02/2014   Procedure: RIGHT PARTIAL COLECTOMY ;  Surgeon: Donnie Mesa, MD;  Location: [place];  Service: General;  Laterality: N/A;  . PATELLECTOMY Right 09/14/2013   Procedure: PATELLECTOMY;  Surgeon: Meredith Pel, MD;  Location: [place];  Service: Orthopedics;  Laterality: Right;  . PROCTOSCOPY N/A 10/13/2014   Procedure: RIDGE PROCTOSCOPY;  Surgeon: [name], MD;  [facility];  Service: General;  Laterality: N/A;  . REVISION TOTAL KNEE ARTHROPLASTY Bilateral right  04-02-2011/  left 1996 & 10-05-1999  . ROTATOR CUFF REPAIR Bilateral   . TONSILLECTOMY    . TOTAL KNEE ARTHROPLASTY Bilateral left 1994/  right 2000  . TOTAL KNEE REVISION Left 08/02/2015   Procedure: LEFT FEMORAL REVISION;  Surgeon: Gaynelle Arabian, MD;  [facility];  Service: Orthopedics;  Laterality: Left;  . TRANSTHORACIC ECHOCARDIOGRAM  12-01-2009   Grade I diastolic dysfunction/  ef 60%/  moderate MR/  mild TR    reports that she quit smoking about 52 years ago. Her smoking use included cigarettes. She started smoking about 68 years ago. She has a 8.00 pack-year smoking history. She has never used smokeless tobacco. She reports  that she does not drink alcohol or use drugs. family history includes Cancer (age of onset: 49) in her sister; Colon cancer (age of onset: 59) in her maternal uncle; Heart Problems in her father; Heart failure in her mother; Kidney cancer (age of onset: 27) in her brother; Osteoarthritis in her mother. No Known Allergies   Review of Systems  Constitutional: Negative for chills and fever.  Respiratory: Negative for cough and shortness of breath.   Cardiovascular: Positive for leg swelling. Negative for chest pain.  Gastrointestinal: Negative for diarrhea, nausea and vomiting.  Genitourinary: Negative for dysuria.  Neurological: Negative for weakness.  Psychiatric/Behavioral: Negative for confusion.       Objective:   Physical Exam Vitals reviewed.  Constitutional:      Appearance: Normal appearance.  Cardiovascular:     Rate and Rhythm: Normal rate.  Pulmonary:     Effort: Pulmonary effort is normal.     Breath sounds: Normal breath sounds.  Skin:    Comments: Patient has fairly severe edema right lower extremity compared with left.  She has diffuse edema from above the knee all the way down to the ankle region.  She has ecchymosis involving the foot.  She has redness involving most of the anterior right leg with increased warmth and tenderness to palpation.  No open wounds.  She has some localized soft tissue swelling near the site of initial abrasion consistent with likely seroma or possibly hematoma  Neurological:     Mental Status: She is alert.        Assessment:     Progressive cellulitis changes right lower extremity following injury December 26.  She has likely seroma or hematoma involving the leg near the site of the initial injury.  She has not improved with doxycycline.  She is nontoxic in appearance.  Recent Doppler as above negative for DVT and patient is on chronic Eliquis    Plan:     -Change coverage to Keflex 500 mg 4 times daily for 10 days to offer strep  coverage -Recommend frequent elevation -Patient complaining of severe pain and we have written for limited Vicodin 5/325 mg 1 every 6 hours as needed for severe pain #20 with no refill -They will try heating pad on low heat several times daily -Recommend follow-up with primary by early next week and sooner as needed. -If worsening with the above Tech need hospital admission for further therapy  Eulas Post MD [facility]

## 2019-07-28 NOTE — Telephone Encounter (Signed)
Spoke to someone from the company and they stated that  they have a Rx for Eliquis. They stated that they need pt demographic to send the medication to them. Demographics will be faxed to [phone].

## 2019-07-29 NOTE — Addendum Note (Signed)
Addended by: Dimitri Ped on: 07/29/2019 08:25 AM   Modules accepted: Orders

## 2019-07-30 ENCOUNTER — Encounter: Payer: Self-pay | Admitting: Internal Medicine

## 2019-07-30 ENCOUNTER — Ambulatory Visit: Payer: HMO | Admitting: Internal Medicine

## 2019-07-30 ENCOUNTER — Other Ambulatory Visit: Payer: Self-pay | Admitting: Pharmacist

## 2019-07-30 ENCOUNTER — Telehealth (INDEPENDENT_AMBULATORY_CARE_PROVIDER_SITE_OTHER): Payer: HMO | Admitting: Family Medicine

## 2019-07-30 ENCOUNTER — Telehealth: Payer: Self-pay | Admitting: *Deleted

## 2019-07-30 ENCOUNTER — Other Ambulatory Visit: Payer: Self-pay

## 2019-07-30 VITALS — BP 110/66 | HR 85 | Temp 97.6°F | Ht 63.5 in | Wt 193.0 lb

## 2019-07-30 DIAGNOSIS — Z7901 Long term (current) use of anticoagulants: Secondary | ICD-10-CM

## 2019-07-30 DIAGNOSIS — Z85038 Personal history of other malignant neoplasm of large intestine: Secondary | ICD-10-CM | POA: Diagnosis not present

## 2019-07-30 DIAGNOSIS — K648 Other hemorrhoids: Secondary | ICD-10-CM

## 2019-07-30 DIAGNOSIS — D689 Coagulation defect, unspecified: Secondary | ICD-10-CM | POA: Diagnosis not present

## 2019-07-30 DIAGNOSIS — K625 Hemorrhage of anus and rectum: Secondary | ICD-10-CM

## 2019-07-30 DIAGNOSIS — L03115 Cellulitis of right lower limb: Secondary | ICD-10-CM

## 2019-07-30 MED ORDER — HYDROCORTISONE (PERIANAL) 2.5 % EX CREA: 1.0000 "application " | TOPICAL_CREAM | Freq: Two times a day (BID) | CUTANEOUS | 1 refills | Status: DC

## 2019-07-30 NOTE — Telephone Encounter (Signed)
Copied from [place] [id number]. Topic: Appointment Scheduling - Scheduling Inquiry for Clinic >> Jul 30, 2019  9:01 AM Mathis Bud wrote: Reason for CRM: [name] patients daughter called regarding patients leg, Patient was in office two days ago and PCP advised patient to come back in two days if leg is not better. Call back [phone]

## 2019-07-30 NOTE — Patient Outreach (Signed)
[facility]) Care Management  07/30/2019  [name] 06-Mar-1930 [id number]  Patient was called regarding medication assistance. A female answered her phone and said the patient was in the middle of a doctor's appointment and asked that I call them back. My call schedule did not allow for a call back on the same day.  Plan: Call patient back in 1-2 business days.  Elayne Guerin, PharmD, [place] Clinical Pharmacist [phone]

## 2019-07-30 NOTE — Patient Instructions (Signed)
Take 1-2 tablespoons of metamucil daily.  We have sent the following medications to your pharmacy for you to pick up at your convenience:  Nusol Hydrocortisone Cream.  Purchase over the counter Preparation H suppositories.  Put a small amount of the cream on the suppository and insert into the rectum.  Use at bedtime.

## 2019-07-30 NOTE — Progress Notes (Signed)
HISTORY OF PRESENT ILLNESS:  [name] is a 86 y.o. female with multiple significant medical problems including atrial fibrillation, history of stroke, history of DVT on chronic Eliquis therapy.  She also has a history of colon cancer for which she underwent right hemicolectomy November 2015.  Her last complete colonoscopy was performed December 25, 2016.  At that time she was noted to have diminutive polyps and internal hemorrhoids.  Given these favorable findings, her advanced age, and multiple medical problems no further follow-up recommended.  She is sent today by her oncologist regarding rectal bleeding.  The patient was seen in the emergency room July 10, 2019 for rectal bleeding.  This occurred over the course of 3 to 4 days.  There were no associated symptoms.  The emergency room doctor felt that this [name] be secondary to hemorrhoids.  Follow-up with her GI doctor recommended.  Her last hemoglobin on July 21, 2019 was 13.3.  Patient tells me that she has had no further bleeding since the initial episodes.  She does have a tendency toward loose stools.  She is accompanied today by her daughter.  REVIEW OF SYSTEMS:  All non-GI ROS negative unless otherwise stated in the HPI except for lower extremity swelling, arthritis, depression  Past Medical History:  Diagnosis Date  . Adenocarcinoma, colon [facility]) Oncologist-- Dr Truitt Merle   Multifocal (2) Colon cancer @ ileocecal valve and ascending ( mT4N1Mx), Stage IIIB, grade I, MMR normal , 2 of 16 +lymph nodes, negative surgical margins---  06-02-2014  Right hemicolectomy w/ colostomy  . Allergy   . Anxiety   . Asthma    inhaler "sometimes"  . CAD (coronary artery disease)    a. LHC 4/09: pLAD 40, mDx 70-75, pLCx 30, mLCx 50, mRCA 90, EF 60% >> PCI: BMS x2 to RCA;  b. Myoview 8/12: normal  . Cataract    bil cateracts removed  . Chronic anemia   . Chronic diastolic CHF (congestive heart failure) (HCC)    a. Echo 912 - Mild LVH, EF 55-60%, no  RWMA, Gr 1 DD, mild MR, mild LAE, mild RAE, PASP 59 mmHg (mod to severe pulmo HTN)  . Clotting disorder (HCC)    hx of dvt, tia on eliquis  . Colostomy in place [facility])    s/p colostomy takedown 5/16  . Colovaginal fistula    s/p colostomy >> colostomy takedown 5/16  . COPD with chronic bronchitis ([place])   . Depression   . Dysrhythmia    A fib  . Essential hypertension   . GERD (gastroesophageal reflux disease)   . History of adenomatous polyp of colon   . History of blood transfusion   . History of DVT of lower extremity   . History of hiatal hernia   . History of TIA (transient ischemic attack)    a. Head CT 6/15: small chronic lacunar infarct in thalamus  . Hyperlipidemia   . OA (osteoarthritis)   . Osteoporosis   . Peripheral neuropathy   . Pneumonia   . Pulmonary HTN ([employer])    a. PASP on Echo in 9/12:  59 mmHg  . Renal insufficiency   . Sigmoid diverticulosis    s/p sigmoid colectomy  . Stroke ([place])    TIAs  . Wears dentures   . Wears glasses     Past Surgical History:  Procedure Laterality Date  . ABDOMINAL HYSTERECTOMY    . ANTERIOR CERVICAL DECOMP/DISCECTOMY FUSION  01-31-2009   C5 -- 7  . APPENDECTOMY    .  Bone spurs Bilateral    Feet  . CARDIOVASCULAR STRESS TEST  02-26-2011   Normal lexiscan no exercise study/  no ischemia/  normal LV function and wall motion, ef 67%  . CARPAL TUNNEL RELEASE Right   . CATARACT EXTRACTION W/ INTRAOCULAR LENS  IMPLANT, BILATERAL Bilateral   . CHOLECYSTECTOMY    . COLOSTOMY N/A 06/02/2014   Procedure: DIVERTING DESCENDING END COLOSTOMY;  Surgeon: Donnie Mesa, MD;  Location: [place];  Service: General;  Laterality: N/A;  . CORONARY ANGIOPLASTY WITH STENT PLACEMENT  11-04-2007  dr bensimhon   BMS x2 to RCA/  mild Non-obstructive disease LAD/  normal LVF  . DILATION AND CURETTAGE OF UTERUS    . ESOPHAGOGASTRODUODENOSCOPY N/A 10/28/2017   Procedure: ESOPHAGOGASTRODUODENOSCOPY (EGD);  Surgeon: Yetta Flock, MD;  Location:  [facility] ENDOSCOPY;  Service: Gastroenterology;  Laterality: N/A;  . EVALUATION UNDER ANESTHESIA WITH ANAL FISTULECTOMY N/A 10/13/2014   Procedure: ANAL EXAM UNDER ANESTHESIA ;  Surgeon: [name], MD;  [facility];  Service: General;  Laterality: N/A;  . HAND SURGERY     Tendon repair  . LAPAROSCOPIC SIGMOID COLECTOMY N/A 11/24/2014   Procedure: SIGMOID COLECTOMY AND COLSOTOMY CLOSURE;  Surgeon: Donnie Mesa, MD;  Location: [place];  Service: General;  Laterality: N/A;  . LUMBAR LAMINECTOMY  10-29-2002,  1969   Left  L3 -- 4  decompression  . PARTIAL COLECTOMY N/A 06/02/2014   Procedure: RIGHT PARTIAL COLECTOMY ;  Surgeon: Donnie Mesa, MD;  Location: [place];  Service: General;  Laterality: N/A;  . PATELLECTOMY Right 09/14/2013   Procedure: PATELLECTOMY;  Surgeon: Meredith Pel, MD;  Location: [place];  Service: Orthopedics;  Laterality: Right;  . PROCTOSCOPY N/A 10/13/2014   Procedure: RIDGE PROCTOSCOPY;  Surgeon: [name], MD;  [facility];  Service: General;  Laterality: N/A;  . REVISION TOTAL KNEE ARTHROPLASTY Bilateral right  04-02-2011/  left 1996 & 10-05-1999  . ROTATOR CUFF REPAIR Bilateral   . TONSILLECTOMY    . TOTAL KNEE ARTHROPLASTY Bilateral left 1994/  right 2000  . TOTAL KNEE REVISION Left 08/02/2015   Procedure: LEFT FEMORAL REVISION;  Surgeon: Gaynelle Arabian, MD;  [facility];  Service: Orthopedics;  Laterality: Left;  . TRANSTHORACIC ECHOCARDIOGRAM  12-01-2009   Grade I diastolic dysfunction/  ef 60%/  moderate MR/  mild TR    Social History [name]  reports that she quit smoking about 52 years ago. Her smoking use included cigarettes. She started smoking about 68 years ago. She has a 8.00 pack-year smoking history. She has never used smokeless tobacco. She reports that she does not drink alcohol or use drugs.  family history includes Cancer (age of onset: 71) in her sister; Colon cancer (age of onset: 39) in her maternal uncle; Heart Problems in her  father; Heart failure in her mother; Kidney cancer (age of onset: 33) in her brother; Osteoarthritis in her mother.  No Known Allergies     PHYSICAL EXAMINATION: Vital signs: BP 110/66   Pulse 85   Temp 97.6 F (36.4 C)   Ht 5' 3.5" (1.613 m)   Wt 193 lb (87.5 kg)   BMI 33.65 kg/m   Constitutional: Pleasant, obese, chronically ill-appearing, no acute distress Psychiatric: alert and oriented x3, cooperative Eyes: extraocular movements intact, anicteric, conjunctiva pink Mouth: Mask Neck: supple no lymphadenopathy Cardiovascular: heart regular rate and rhythm, no murmur Lungs: clear to auscultation bilaterally Abdomen: soft, obese, nontender, nondistended, no obvious ascites, no peritoneal signs, normal bowel  sounds, no organomegaly Rectal: No external abnormalities.  No tenderness or mass.  Brown Hemoccult negative stool. Extremities: No clubbing or cyanosis.  Bilateral edema much greater on right.  Evidence of cellulitis and ulceration of leg on right Skin: Cellulitis and swelling of the right lower extremity with focal ulceration above the ankle Neuro: No focal deficits.  Cranial nerves intact  ANOSCOPY: Anoscopy reveals large internal hemorrhoids with bluish hue  ASSESSMENT:  1.  Recent GI bleeding secondary to hemorrhoids 2.  History of colon cancer status post right hemicolectomy 2015 3.  Last colonoscopy June 2018 with diminutive polyps and hemorrhoids only 4.  Multiple medical problems.  On chronic anticoagulation   PLAN:  1.  Metamucil 2 tablespoons daily 2.  Anusol suppositories at night.  Prescribed 3.  Resume care with PCP and other specialist.  GI follow-up as needed

## 2019-07-30 NOTE — Telephone Encounter (Signed)
Patient's daughter called back to get appointment for today, stating that she was told my Dr. Elease Hashimoto yesterday to follow-up. Patient scheduled for virtual visit with Dr. Elease Hashimoto. Spoke with Jinny Blossom about appointment, who then spoke with Dr. Elease Hashimoto and per Jinny Blossom per Dr. Elease Hashimoto said that "she needs to go to the hospital for IV antibiotics. Unfortunately if she is getting worse that is what she needs to do. He said she has already been on 2 different antibiotics and not getting better. Recommend [facility] if they ask. Thank you!!"  I returned the call to [name] to give advise per Dr. Elease Hashimoto and she still wanted patient to have virtual appointment today and if Dr. Elease Hashimoto felt the same about the ER after visit, then she would do it. [name] stated that her daughter, Shirlean Mylar would be with patient at 3:30 to help with the appointment.

## 2019-07-30 NOTE — Telephone Encounter (Signed)
FYI PT has been scheduled with [facility]. No further action needed at this time.

## 2019-07-30 NOTE — Progress Notes (Signed)
This visit type was conducted due to national recommendations for restrictions regarding the COVID-19 pandemic in an effort to limit this patient's exposure and mitigate transmission in our community.   Virtual Visit via Video Note  I connected with [name] on 07/30/19 at  3:30 PM EST by a video enabled telemedicine application and verified that I am speaking with the correct person using two identifiers.  Location patient: home Location provider:work or home office Persons participating in the virtual visit: patient, provider  I discussed the limitations of evaluation and management by telemedicine and the availability of in person appointments. The patient expressed understanding and agreed to proceed.   HPI: This [name] has had some ongoing swelling of the right leg.  Refer to dictation for 07/28/2019.  She has cellulitis and was initially treated in the ER with doxycycline.  She was not getting better so we switched to Keflex which she is taking 4 times daily.  We had received message earlier today that her leg was "much worse".  By this, they simply meant that she was having some clear drainage from the initial site of the injury but they have not seen any purulent drainage.  Daughter thinks her redness is actually slightly better.  No fevers or chills.  No nausea or vomiting.  She is elevating leg frequently and using some heating pad intermittently.  They have not seen any evidence for skin necrosis.  She has probable seroma versus hematoma which is probably compounding the edema issue.  She takes anticoagulation chronically and had recent Doppler negative for DVT   ROS: See pertinent positives and negatives per HPI.  Past Medical History:  Diagnosis Date  . Adenocarcinoma, colon [facility]) Oncologist-- Dr Truitt Merle   Multifocal (2) Colon cancer @ ileocecal valve and ascending ( mT4N1Mx), Stage IIIB, grade I, MMR normal , 2 of 16 +lymph nodes, negative surgical margins---  06-02-2014  Right  hemicolectomy w/ colostomy  . Allergy   . Anxiety   . Asthma    inhaler "sometimes"  . CAD (coronary artery disease)    a. LHC 4/09: pLAD 40, mDx 70-75, pLCx 30, mLCx 50, mRCA 90, EF 60% >> PCI: BMS x2 to RCA;  b. Myoview 8/12: normal  . Cataract    bil cateracts removed  . Chronic anemia   . Chronic diastolic CHF (congestive heart failure) (HCC)    a. Echo 912 - Mild LVH, EF 55-60%, no RWMA, Gr 1 DD, mild MR, mild LAE, mild RAE, PASP 59 mmHg (mod to severe pulmo HTN)  . Clotting disorder (HCC)    hx of dvt, tia on eliquis  . Colostomy in place [facility])    s/p colostomy takedown 5/16  . Colovaginal fistula    s/p colostomy >> colostomy takedown 5/16  . COPD with chronic bronchitis ([place])   . Depression   . Dysrhythmia    A fib  . Essential hypertension   . GERD (gastroesophageal reflux disease)   . History of adenomatous polyp of colon   . History of blood transfusion   . History of DVT of lower extremity   . History of hiatal hernia   . History of TIA (transient ischemic attack)    a. Head CT 6/15: small chronic lacunar infarct in thalamus  . Hyperlipidemia   . OA (osteoarthritis)   . Osteoporosis   . Peripheral neuropathy   . Pneumonia   . Pulmonary HTN ([place])    a. PASP on Echo in 9/12:  59 mmHg  .  Renal insufficiency   . Sigmoid diverticulosis    s/p sigmoid colectomy  . Stroke ([place])    TIAs  . Wears dentures   . Wears glasses     Past Surgical History:  Procedure Laterality Date  . ABDOMINAL HYSTERECTOMY    . ANTERIOR CERVICAL DECOMP/DISCECTOMY FUSION  01-31-2009   C5 -- 7  . APPENDECTOMY    . Bone spurs Bilateral    Feet  . CARDIOVASCULAR STRESS TEST  02-26-2011   Normal lexiscan no exercise study/  no ischemia/  normal LV function and wall motion, ef 67%  . CARPAL TUNNEL RELEASE Right   . CATARACT EXTRACTION W/ INTRAOCULAR LENS  IMPLANT, BILATERAL Bilateral   . CHOLECYSTECTOMY    . COLOSTOMY N/A 06/02/2014   Procedure: DIVERTING DESCENDING END COLOSTOMY;   Surgeon: Donnie Mesa, MD;  Location: [place];  Service: General;  Laterality: N/A;  . CORONARY ANGIOPLASTY WITH STENT PLACEMENT  11-04-2007  dr bensimhon   BMS x2 to RCA/  mild Non-obstructive disease LAD/  normal LVF  . DILATION AND CURETTAGE OF UTERUS    . ESOPHAGOGASTRODUODENOSCOPY N/A 10/28/2017   Procedure: ESOPHAGOGASTRODUODENOSCOPY (EGD);  Surgeon: Yetta Flock, MD;  Location: [facility] ENDOSCOPY;  Service: Gastroenterology;  Laterality: N/A;  . EVALUATION UNDER ANESTHESIA WITH ANAL FISTULECTOMY N/A 10/13/2014   Procedure: ANAL EXAM UNDER ANESTHESIA ;  Surgeon: [name], MD;  Location: [facility];  Service: General;  Laterality: N/A;  . HAND SURGERY     Tendon repair  . LAPAROSCOPIC SIGMOID COLECTOMY N/A 11/24/2014   Procedure: SIGMOID COLECTOMY AND COLSOTOMY CLOSURE;  Surgeon: Donnie Mesa, MD;  Location: [place];  Service: General;  Laterality: N/A;  . LUMBAR LAMINECTOMY  10-29-2002,  1969   Left  L3 -- 4  decompression  . PARTIAL COLECTOMY N/A 06/02/2014   Procedure: RIGHT PARTIAL COLECTOMY ;  Surgeon: Donnie Mesa, MD;  Location: [place];  Service: General;  Laterality: N/A;  . PATELLECTOMY Right 09/14/2013   Procedure: PATELLECTOMY;  Surgeon: Meredith Pel, MD;  Location: [place];  Service: Orthopedics;  Laterality: Right;  . PROCTOSCOPY N/A 10/13/2014   Procedure: RIDGE PROCTOSCOPY;  Surgeon: [name], MD;  Location: [facility];  Service: General;  Laterality: N/A;  . REVISION TOTAL KNEE ARTHROPLASTY Bilateral right  04-02-2011/  left 1996 & 10-05-1999  . ROTATOR CUFF REPAIR Bilateral   . TONSILLECTOMY    . TOTAL KNEE ARTHROPLASTY Bilateral left 1994/  right 2000  . TOTAL KNEE REVISION Left 08/02/2015   Procedure: LEFT FEMORAL REVISION;  Surgeon: Gaynelle Arabian, MD;  Location: [facility];  Service: Orthopedics;  Laterality: Left;  . TRANSTHORACIC ECHOCARDIOGRAM  12-01-2009   Grade I diastolic dysfunction/  ef 60%/  moderate MR/  mild TR    Family History  Problem Relation Age of  Onset  . Osteoarthritis Mother   . Heart failure Mother   . Colon cancer Maternal Uncle 51  . Heart Problems Father   . Cancer Sister 10       ? colon cancer   . Kidney cancer Brother 53  . Esophageal cancer Neg Hx   . Rectal cancer Neg Hx   . Stomach cancer Neg Hx   . Pancreatic cancer Neg Hx     SOCIAL HX: Non-smoker   Current Outpatient Medications:  .  acetaminophen (TYLENOL) 325 MG tablet, Take 650 mg by mouth every 6 (six) hours as needed for moderate pain. , Disp: , Rfl:  .  albuterol (PROAIR HFA) 108 (90 Base) MCG/ACT  inhaler, Inhale 2 puffs into the lungs every 6 (six) hours as needed for wheezing or shortness of breath., Disp: 6.7 g, Rfl: 6 .  allopurinol (ZYLOPRIM) 100 MG tablet, Take 100 mg by mouth daily., Disp: , Rfl:  .  apixaban (ELIQUIS) 5 MG TABS tablet, Take 1 tablet (5 mg total) by mouth 2 (two) times daily., Disp: 180 tablet, Rfl: 0 .  Ascorbic Acid (VITAMIN C) 1000 MG tablet, Take 1,000 mg by mouth daily., Disp: , Rfl:  .  benazepril (LOTENSIN) 40 MG tablet, Take 1 tablet (40 mg total) by mouth daily., Disp: 90 tablet, Rfl: 0 .  cephALEXin (KEFLEX) 500 MG capsule, Take 1 capsule (500 mg total) by mouth 4 (four) times daily., Disp: 40 capsule, Rfl: 0 .  colchicine 0.6 MG tablet, Take 0.6 mg by mouth daily., Disp: , Rfl:  .  diltiazem (CARDIZEM CD) 120 MG 24 hr capsule, Take 1 capsule by mouth as needed when Heart Rate is above 256 and Systolic Blood Pressure is above 100 mm/hg (Patient taking differently: Take 120 mg by mouth daily. ), Disp: 90 capsule, Rfl: 3 .  esomeprazole (NEXIUM) 40 MG capsule, Take 1 capsule (40 mg total) by mouth daily., Disp: 90 capsule, Rfl: 0 .  ferrous sulfate 325 (65 FE) MG EC tablet, Take 325 mg by mouth daily with breakfast., Disp: , Rfl:  .  fluticasone (FLOVENT HFA) 44 MCG/ACT inhaler, Inhale 2 puffs into the lungs 2 (two) times daily., Disp: 1 Inhaler, Rfl: 0 .  furosemide (LASIX) 20 MG tablet, Take 2 tablets (40 mg total) by mouth  daily. Please keep upcoming appt in November with Dr. Acie Fredrickson before anymore refills. Thank you, Disp: 180 tablet, Rfl: 3 .  gabapentin (NEURONTIN) 600 MG tablet, Take 1 tablet (600 mg total) by mouth 2 (two) times daily., Disp: 180 tablet, Rfl: 0 .  HYDROcodone-acetaminophen (NORCO/VICODIN) 5-325 MG tablet, Take 1 tablet by mouth every 6 (six) hours as needed for severe pain., Disp: 5 tablet, Rfl: 0 .  hydrocortisone (ANUSOL-HC) 2.5 % rectal cream, Place 1 application rectally 2 (two) times daily., Disp: 30 g, Rfl: 1 .  LORazepam (ATIVAN) 0.5 MG tablet, 1 tablet twice daily as needed for anxiety or sleep (Patient taking differently: Take 0.5 mg by mouth 2 (two) times daily as needed for anxiety or sleep. ), Disp: 60 tablet, Rfl: 2 .  metoprolol tartrate (LOPRESSOR) 50 MG tablet, Take 1.5 tablets (75 mg total) by mouth 2 (two) times daily., Disp: 270 tablet, Rfl: 0 .  nitroGLYCERIN (NITROSTAT) 0.4 MG SL tablet, Place 1 tablet (0.4 mg total) under the tongue every 5 (five) minutes as needed for chest pain (x 3 pills). Reported on 09/01/2015, Disp: 25 tablet, Rfl: 3 .  sertraline (ZOLOFT) 50 MG tablet, Take 1 tablet (50 mg total) by mouth daily., Disp: 90 tablet, Rfl: 0 .  Spacer/Aero-Holding Chambers (E-Z SPACER) inhaler, Use as instructed, Disp: 1 each, Rfl: 2 .  Tiotropium Bromide Monohydrate (SPIRIVA RESPIMAT) 2.5 MCG/ACT AERS, Inhale 2 puffs into the lungs daily., Disp: 4 g, Rfl: 3 .  traZODone (DESYREL) 100 MG tablet, TAKE 1/2 (ONE-HALF) TABLET BY MOUTH AT BEDTIME (Patient taking differently: Take 50 mg by mouth at bedtime. ), Disp: 45 tablet, Rfl: 1 .  triamcinolone cream (KENALOG) 0.1 %, Apply 1 application topically 2 (two) times daily as needed (Eczema on legs)., Disp: 30 g, Rfl: 2  EXAM:  VITALS per patient if applicable:  GENERAL: alert, oriented, appears well and in no acute  distress  HEENT: atraumatic, conjunttiva clear, no obvious abnormalities on inspection of external nose and  ears  NECK: normal movements of the head and neck  LUNGS: on inspection no signs of respiratory distress, breathing rate appears normal, no obvious gross SOB, gasping or wheezing  CV: no obvious cyanosis  MS: moves all visible extremities without noticeable abnormality  PSYCH/NEURO: pleasant and cooperative, no obvious depression or anxiety, speech and thought processing grossly intact  ASSESSMENT AND PLAN:  Discussed the following assessment and plan:  Cellulitis right leg.  According to daughter this might be slightly improved.  She does have some increased drainage at site of initial injury but no purulent drainage.  -Continue frequent elevation  -Continue Keflex 500 mg 4 times daily  -Continue heating pad on low heat  -Discussed option of possible home health nurse follow-up.  She will touch base with giving [place] some follow-up by Monday and we will decide based on her response at that point whether to pursue home health option  -We suggest the daughter consider compression wrap with [employer] over the weekend     I discussed the assessment and treatment plan with the patient. The patient was provided an opportunity to ask questions and all were answered. The patient agreed with the plan and demonstrated an understanding of the instructions.   The patient was advised to call back or seek an in-person evaluation if the symptoms worsen or if the condition fails to improve as anticipated.     Carolann Littler, MD

## 2019-07-30 NOTE — Telephone Encounter (Signed)
Demographics faxed with confirmation.

## 2019-08-01 ENCOUNTER — Encounter: Payer: Self-pay | Admitting: Family Medicine

## 2019-08-02 ENCOUNTER — Encounter: Payer: Self-pay | Admitting: Family Medicine

## 2019-08-02 ENCOUNTER — Inpatient Hospital Stay (HOSPITAL_COMMUNITY)
Admission: EM | Admit: 2019-08-02 | Discharge: 2019-08-07 | DRG: 603 | Disposition: A | Discharge status: Home / Self Care | Payer: HMO | Attending: Internal Medicine | Admitting: Internal Medicine

## 2019-08-02 ENCOUNTER — Other Ambulatory Visit: Payer: Self-pay | Admitting: Pharmacist

## 2019-08-02 ENCOUNTER — Encounter (HOSPITAL_COMMUNITY): Payer: Self-pay

## 2019-08-02 ENCOUNTER — Ambulatory Visit: Payer: HMO

## 2019-08-02 ENCOUNTER — Other Ambulatory Visit: Payer: Self-pay

## 2019-08-02 ENCOUNTER — Emergency Department (HOSPITAL_COMMUNITY): Payer: HMO

## 2019-08-02 DIAGNOSIS — L0390 Cellulitis, unspecified: Secondary | ICD-10-CM | POA: Diagnosis present

## 2019-08-02 DIAGNOSIS — K219 Gastro-esophageal reflux disease without esophagitis: Secondary | ICD-10-CM | POA: Diagnosis present

## 2019-08-02 DIAGNOSIS — N1831 Chronic kidney disease, stage 3a: Secondary | ICD-10-CM | POA: Diagnosis not present

## 2019-08-02 DIAGNOSIS — Z86010 Personal history of colonic polyps: Secondary | ICD-10-CM | POA: Diagnosis not present

## 2019-08-02 DIAGNOSIS — R531 Weakness: Secondary | ICD-10-CM | POA: Diagnosis not present

## 2019-08-02 DIAGNOSIS — Z8673 Personal history of transient ischemic attack (TIA), and cerebral infarction without residual deficits: Secondary | ICD-10-CM | POA: Diagnosis not present

## 2019-08-02 DIAGNOSIS — Z981 Arthrodesis status: Secondary | ICD-10-CM | POA: Diagnosis not present

## 2019-08-02 DIAGNOSIS — Z9049 Acquired absence of other specified parts of digestive tract: Secondary | ICD-10-CM

## 2019-08-02 DIAGNOSIS — Z87891 Personal history of nicotine dependence: Secondary | ICD-10-CM

## 2019-08-02 DIAGNOSIS — R0602 Shortness of breath: Secondary | ICD-10-CM | POA: Diagnosis not present

## 2019-08-02 DIAGNOSIS — M792 Neuralgia and neuritis, unspecified: Secondary | ICD-10-CM | POA: Diagnosis not present

## 2019-08-02 DIAGNOSIS — F329 Major depressive disorder, single episode, unspecified: Secondary | ICD-10-CM | POA: Diagnosis present

## 2019-08-02 DIAGNOSIS — M810 Age-related osteoporosis without current pathological fracture: Secondary | ICD-10-CM | POA: Diagnosis present

## 2019-08-02 DIAGNOSIS — I10 Essential (primary) hypertension: Secondary | ICD-10-CM | POA: Diagnosis present

## 2019-08-02 DIAGNOSIS — B965 Pseudomonas (aeruginosa) (mallei) (pseudomallei) as the cause of diseases classified elsewhere: Secondary | ICD-10-CM | POA: Diagnosis present

## 2019-08-02 DIAGNOSIS — L03115 Cellulitis of right lower limb: Secondary | ICD-10-CM

## 2019-08-02 DIAGNOSIS — Z961 Presence of intraocular lens: Secondary | ICD-10-CM | POA: Diagnosis present

## 2019-08-02 DIAGNOSIS — Z7901 Long term (current) use of anticoagulants: Secondary | ICD-10-CM | POA: Diagnosis not present

## 2019-08-02 DIAGNOSIS — J439 Emphysema, unspecified: Secondary | ICD-10-CM | POA: Diagnosis present

## 2019-08-02 DIAGNOSIS — J449 Chronic obstructive pulmonary disease, unspecified: Secondary | ICD-10-CM | POA: Diagnosis present

## 2019-08-02 DIAGNOSIS — G4700 Insomnia, unspecified: Secondary | ICD-10-CM | POA: Diagnosis present

## 2019-08-02 DIAGNOSIS — Z9842 Cataract extraction status, left eye: Secondary | ICD-10-CM | POA: Diagnosis not present

## 2019-08-02 DIAGNOSIS — Z20822 Contact with and (suspected) exposure to covid-19: Secondary | ICD-10-CM | POA: Diagnosis present

## 2019-08-02 DIAGNOSIS — N183 Chronic kidney disease, stage 3 unspecified: Secondary | ICD-10-CM

## 2019-08-02 DIAGNOSIS — I482 Chronic atrial fibrillation, unspecified: Secondary | ICD-10-CM | POA: Diagnosis present

## 2019-08-02 DIAGNOSIS — M109 Gout, unspecified: Secondary | ICD-10-CM | POA: Diagnosis present

## 2019-08-02 DIAGNOSIS — F32A Depression, unspecified: Secondary | ICD-10-CM | POA: Diagnosis present

## 2019-08-02 DIAGNOSIS — Z7902 Long term (current) use of antithrombotics/antiplatelets: Secondary | ICD-10-CM

## 2019-08-02 DIAGNOSIS — Z79891 Long term (current) use of opiate analgesic: Secondary | ICD-10-CM

## 2019-08-02 DIAGNOSIS — I272 Pulmonary hypertension, unspecified: Secondary | ICD-10-CM | POA: Diagnosis present

## 2019-08-02 DIAGNOSIS — I130 Hypertensive heart and chronic kidney disease with heart failure and stage 1 through stage 4 chronic kidney disease, or unspecified chronic kidney disease: Secondary | ICD-10-CM | POA: Diagnosis present

## 2019-08-02 DIAGNOSIS — I2510 Atherosclerotic heart disease of native coronary artery without angina pectoris: Secondary | ICD-10-CM | POA: Diagnosis present

## 2019-08-02 DIAGNOSIS — I5032 Chronic diastolic (congestive) heart failure: Secondary | ICD-10-CM | POA: Diagnosis present

## 2019-08-02 DIAGNOSIS — Z9841 Cataract extraction status, right eye: Secondary | ICD-10-CM

## 2019-08-02 DIAGNOSIS — F419 Anxiety disorder, unspecified: Secondary | ICD-10-CM | POA: Diagnosis present

## 2019-08-02 DIAGNOSIS — Z96643 Presence of artificial hip joint, bilateral: Secondary | ICD-10-CM | POA: Diagnosis present

## 2019-08-02 DIAGNOSIS — Z85038 Personal history of other malignant neoplasm of large intestine: Secondary | ICD-10-CM | POA: Diagnosis not present

## 2019-08-02 DIAGNOSIS — N390 Urinary tract infection, site not specified: Secondary | ICD-10-CM | POA: Diagnosis present

## 2019-08-02 DIAGNOSIS — J432 Centrilobular emphysema: Secondary | ICD-10-CM | POA: Diagnosis present

## 2019-08-02 DIAGNOSIS — Z800 Family history of malignant neoplasm of digestive organs: Secondary | ICD-10-CM

## 2019-08-02 DIAGNOSIS — F5101 Primary insomnia: Secondary | ICD-10-CM | POA: Diagnosis not present

## 2019-08-02 DIAGNOSIS — Z79899 Other long term (current) drug therapy: Secondary | ICD-10-CM

## 2019-08-02 DIAGNOSIS — I5033 Acute on chronic diastolic (congestive) heart failure: Secondary | ICD-10-CM | POA: Diagnosis present

## 2019-08-02 DIAGNOSIS — Z955 Presence of coronary angioplasty implant and graft: Secondary | ICD-10-CM

## 2019-08-02 LAB — CBC WITH DIFFERENTIAL/PLATELET
Abs Immature Granulocytes: 0.02 10*3/uL (ref 0.00–0.07)
Basophils Absolute: 0 10*3/uL (ref 0.0–0.1)
Basophils Relative: 0 %
Eosinophils Absolute: 0.3 10*3/uL (ref 0.0–0.5)
Eosinophils Relative: 4 %
HCT: 37.5 % (ref 36.0–46.0)
Hemoglobin: 11.6 g/dL — ABNORMAL LOW (ref 12.0–15.0)
Immature Granulocytes: 0 %
Lymphocytes Relative: 14 %
Lymphs Abs: 1 10*3/uL (ref 0.7–4.0)
MCH: 30.8 pg (ref 26.0–34.0)
MCHC: 30.9 g/dL (ref 30.0–36.0)
MCV: 99.5 fL (ref 80.0–100.0)
Monocytes Absolute: 0.6 10*3/uL (ref 0.1–1.0)
Monocytes Relative: 9 %
Neutro Abs: 5.2 10*3/uL (ref 1.7–7.7)
Neutrophils Relative %: 73 %
Platelets: 182 10*3/uL (ref 150–400)
RBC: 3.77 MIL/uL — ABNORMAL LOW (ref 3.87–5.11)
RDW: 14.5 % (ref 11.5–15.5)
WBC: 7.2 10*3/uL (ref 4.0–10.5)
nRBC: 0 % (ref 0.0–0.2)

## 2019-08-02 MED ORDER — VANCOMYCIN HCL IN DEXTROSE 1-5 GM/200ML-% IV SOLN
1000.0000 mg | Freq: Once | INTRAVENOUS | Status: AC
  Administered 2019-08-03: 01:00:00 1000 mg via INTRAVENOUS
  Filled 2019-08-02: qty 200

## 2019-08-02 MED ORDER — FENTANYL CITRATE (PF) 100 MCG/2ML IJ SOLN
25.0000 ug | Freq: Once | INTRAMUSCULAR | Status: AC
  Administered 2019-08-02: 25 ug via INTRAVENOUS
  Filled 2019-08-02: qty 2

## 2019-08-02 NOTE — ED Triage Notes (Signed)
Patient c/o cellulitis t the right leg. Patient has swelling, and pain from the right groin to foot. Patient states she has been on 2 different antibiotics. Patient states she was sent by her PCP.

## 2019-08-02 NOTE — Patient Outreach (Signed)
[facility]) Care Management  [place]   08/02/2019  [name] 1930-05-17 [id number]  Reason for referral: Medication Assistance  Referral source: [place] Nurse Referral medication(s): Spiriva Current insurance:HTA/CSNP  HPI:  Patient is an 86 year old female called regarding medication assistance. HIPAA identifiers were obtained.  Patient has a medical history significant for:  CHF, anxiety, depression, history of colon cancer, A Fib, CKD stage III, CAD, hypertension,  GERD, insomnia and osteoarthritis. Patient recently visited the ED for cellulits. She reported being on two rounds of antibiotics without relief. She reported being in 8/10 pain, left leg swollen and hot to the touch.   Objective: No Known Allergies  Medications Reviewed Today    Reviewed by Elayne Guerin, [facility] (Pharmacist) on 08/02/19 at 1457  Med List Status: <None>  Medication Order Taking? Sig Documenting Provider Last Dose Status Informant  acetaminophen (TYLENOL) 325 MG tablet [id number] Yes Take 650 mg by mouth every 6 (six) hours as needed for moderate pain.  [provider] Taking Active Self  albuterol (PROAIR HFA) 108 (90 Base) MCG/ACT inhaler [id number] Yes Inhale 2 puffs into the lungs every 6 (six) hours as needed for wheezing or shortness of breath. [name], MD Taking Active Self  allopurinol (ZYLOPRIM) 100 MG tablet [id number] Yes Take 100 mg by mouth daily. [provider] Taking Active   apixaban (ELIQUIS) 5 MG TABS tablet [id number] Yes Take 1 tablet (5 mg total) by mouth 2 (two) times daily. Nafziger, [name], NP Taking Active Self  Ascorbic Acid (VITAMIN C) 1000 MG tablet [id number] Yes Take 1,000 mg by mouth daily. [provider] Taking Active Self  benazepril (LOTENSIN) 40 MG tablet [id number] Yes Take 1 tablet (40 mg total) by mouth daily. Nafziger, [name], NP Taking Active Self  cephALEXin (KEFLEX) 500 MG capsule [id number] Yes Take 1 capsule  (500 mg total) by mouth 4 (four) times daily. Eulas Post, MD Taking Active   colchicine 0.6 MG tablet [id number] Yes Take 0.6 mg by mouth daily. [provider] Taking Active   diltiazem (CARDIZEM CD) 120 MG 24 hr capsule [id number] Yes Take 1 capsule by mouth as needed when Heart Rate is above 123456 and Systolic Blood Pressure is above 100 mm/hg  Patient taking differently: Take 120 mg by mouth daily.    [name], Wonda Cheng, MD Taking Active Self  esomeprazole (NEXIUM) 40 MG capsule [id number] Yes Take 1 capsule (40 mg total) by mouth daily. Nafziger, [name], NP Taking Active Self  ferrous sulfate 325 (65 FE) MG EC tablet [id number] Yes Take 325 mg by mouth daily with breakfast. [provider] Taking Active Self  fluticasone (FLOVENT HFA) 44 MCG/ACT inhaler [id number] Yes Inhale 2 puffs into the lungs 2 (two) times daily. Lucretia Kern, DO Taking Active Self  furosemide (LASIX) 20 MG tablet [id number] Yes Take 2 tablets (40 mg total) by mouth daily. Please keep upcoming appt in November with Dr. Acie Fredrickson before anymore refills. Thank you [name], Wonda Cheng, MD Taking Active Self  gabapentin (NEURONTIN) 600 MG tablet [id number] Yes Take 1 tablet (600 mg total) by mouth 2 (two) times daily. Nafziger, [name], NP Taking Active Self  HYDROcodone-acetaminophen (NORCO/VICODIN) 5-325 MG tablet [id number] Yes Take 1 tablet by mouth every 6 (six) hours as needed for severe pain. Suzy Bouchard, PA-C Taking Active   hydrocortisone (ANUSOL-HC) 2.5 % rectal cream [id number] Yes Place 1 application rectally 2 (two) times daily. [name], MD Taking Active  LORazepam (ATIVAN) 0.5 MG tablet [id number] Yes 1 tablet twice daily as needed for anxiety or sleep  Patient taking differently: Take 0.5 mg by mouth 2 (two) times daily as needed for anxiety or sleep.    Nafziger, [name], NP Taking Active Self  metoprolol tartrate (LOPRESSOR) 50 MG tablet [id number]  Take 1.5 tablets (75 mg total) by mouth 2 (two)  times daily. [name], NP  Expired 07/10/19 2359 Self  nitroGLYCERIN (NITROSTAT) 0.4 MG SL tablet [id number] Yes Place 1 tablet (0.4 mg total) under the tongue every 5 (five) minutes as needed for chest pain (x 3 pills). Reported on 09/01/2015 [name], NP Taking Active Self  sertraline (ZOLOFT) 50 MG tablet [id number] Yes Take 1 tablet (50 mg total) by mouth daily. [name], NP Taking Active Self  Spacer/Aero-Holding Chambers (E-Z SPACER) inhaler [id number] Yes Use as instructed [name], MD Taking Active Self  Tiotropium Bromide Monohydrate (SPIRIVA RESPIMAT) 2.5 MCG/ACT AERS [id number] Yes Inhale 2 puffs into the lungs daily. [name], MD Taking Active Self  traZODone (DESYREL) 100 MG tablet [id number] Yes TAKE 1/2 (ONE-HALF) TABLET BY MOUTH AT BEDTIME  Patient taking differently: Take 50 mg by mouth at bedtime.    Nafziger, [name], NP Taking Active Self  triamcinolone cream (KENALOG) 0.1 % [id number] Yes Apply 1 application topically 2 (two) times daily as needed (Eczema on legs). [name], NP Taking Active Self          Assessment:  Drugs sorted by system:  Neurologic/Psychologic: Gabapentin, Lorazepam, Sertraline, Trazodone  Cardiovascular: Eliquis, Benazepril,  Diltiazem, Furosemide, Nitroglycerin SL  Pulmonary/Allergy: ProAir HFA, Fluticasone Inhaler (Clarification needed), Spiriva,   Gastrointestinal: Esomeprazole, Hydrocortisone Suppositories  Endocrine:  Renal: Allopurinol, Colchicine  Topical: Triamcinolone Cream   Pain: Acetaminophen, Hydrocodone/APAP  Infectious Diseases: Cephalexin  Vitamins/Minerals/Supplements: Ascorbic Acid, Ferrous Sulfate,  Miscellaneous:  Medication Review Findings:  . Gabapentin dose Imburgia be too high given patient's age and renal function. . With patient's complaints at the beginning of the call, it was recommended that she call her provider back. Patient said her daughter was calling the  provider. Called the patient's daughter, [name]. [name] said she had been in communication with the nurse at the PCP office and was waiting on a call back.  She was hoping home health could help.  Urged patient's daughter to call the PCP office back and potentially go to the ED with the symptoms her mother was presenting, waiting over night would not be recommended.  Medication Assistance Findings:  Medication assistance needs identified: Spiriva. Clarification needed on fluticasone inhaler. Since patient's daughter was going to take her to the ED, I will follow up on med assistance after her ED visit. Patient was actually hospitalized.     Additional medication assistance options reviewed with patient as warranted:  No other options identified  Plan: Follow up with the patient in 3-5 business days.  Elayne Guerin, PharmD, [place] Clinical Pharmacist [phone]

## 2019-08-02 NOTE — ED Notes (Signed)
Pt updated on what she is waiting for.

## 2019-08-02 NOTE — Telephone Encounter (Signed)
Pt's daughter called back saying mom's leg is getting worse and they need to do something today if possible for her. CB#  [phone]

## 2019-08-02 NOTE — ED Provider Notes (Signed)
[place] DEPT Provider Note   CSN: [id number] Arrival date & time: 08/02/19  1642     History Chief Complaint  Patient presents with  . cellullitis    [name] is a 86 y.o. female.  Patient with complicated medical history presents with pain, swelling, redness in the right LE. History obtained from patient and daughter [name]. She had an injury to the leg on December 26th and since has had some degree of swelling. On January 6th she was taken to [facility] for development of pain and redness to the leg and diagnosed with cellulitis. She had a venous doppler study on that visit that was negative for DVT and the patient is on Eliquis chronically. She was given Doxycycline and had follow up with her doctor, Dr. Carolann Littler, on 1/13 at which time her leg was not improving. He changed her antibiotic to Keflex and she has been taking that 4 times daily since. For the last 3-4 days, her pain, redness and swelling have increased, with pain extending to thigh. She has had a subjective fever, nausea with limited number of vomiting episodes.  She denies SOB, chest pain, abdominal pain, persistent nausea, diarrhea.   The history is provided by the patient. No language interpreter was used.       Past Medical History:  Diagnosis Date  . Adenocarcinoma, colon [facility]) Oncologist-- Dr Truitt Merle   Multifocal (2) Colon cancer @ ileocecal valve and ascending ( [id number]), Stage IIIB, grade I, MMR normal , 2 of 16 +lymph nodes, negative surgical margins---  06-02-2014  Right hemicolectomy w/ colostomy  . Allergy   . Anxiety   . Asthma    inhaler "sometimes"  . CAD (coronary artery disease)    a. LHC 4/09: pLAD 40, mDx 70-75, pLCx 30, mLCx 50, mRCA 90, EF 60% >> PCI: BMS x2 to RCA;  b. Myoview 8/12: normal  . Cataract    bil cateracts removed  . Chronic anemia   . Chronic diastolic CHF (congestive heart failure) (HCC)    a. Echo 912 - Mild LVH, EF 55-60%, no  RWMA, Gr 1 DD, mild MR, mild LAE, mild RAE, PASP 59 mmHg (mod to severe pulmo HTN)  . Clotting disorder (HCC)    hx of dvt, tia on eliquis  . Colostomy in place [facility])    s/p colostomy takedown 5/16  . Colovaginal fistula    s/p colostomy >> colostomy takedown 5/16  . COPD with chronic bronchitis ([place])   . Depression   . Dysrhythmia    A fib  . Essential hypertension   . GERD (gastroesophageal reflux disease)   . History of adenomatous polyp of colon   . History of blood transfusion   . History of DVT of lower extremity   . History of hiatal hernia   . History of TIA (transient ischemic attack)    a. Head CT 6/15: small chronic lacunar infarct in thalamus  . Hyperlipidemia   . OA (osteoarthritis)   . Osteoporosis   . Peripheral neuropathy   . Pneumonia   . Pulmonary HTN ([place])    a. PASP on Echo in 9/12:  59 mmHg  . Renal insufficiency   . Sigmoid diverticulosis    s/p sigmoid colectomy  . Stroke ([place])    TIAs  . Wears dentures   . Wears glasses     Patient Active Problem List   Diagnosis Date Noted  . Centrilobular emphysema ([place]) 04/06/2019  .  Gastric ulcer   . Upper GI bleed   . Thrombocytopenia ([place]) 10/27/2017  . Abnormal stress test 10/27/2017  . Moderate mitral regurgitation 10/27/2017  . Acute on chronic diastolic CHF (congestive heart failure) ([place]) 10/27/2017  . CAP (community acquired pneumonia) 10/26/2017  . Displaced fracture of lateral malleolus of right fibula, subsequent encounter for closed fracture with routine healing 12/27/2016  . Abdominal pain, epigastric 11/12/2016  . Nausea and vomiting 11/12/2016  . Iron deficiency anemia 11/12/2016  . Chronic anticoagulation 11/12/2016  . Chronic atrial fibrillation ([place]) 08/05/2016  . Coronary artery disease of native artery of native heart with stable angina pectoris ([place]) 08/05/2016  . Failed total left knee replacement ([place]) 08/02/2015  . Sigmoid diverticulitis 11/24/2014  . Acute blood loss anemia  06/13/2014  . Neuropathic pain 06/13/2014  . Insomnia 06/13/2014  . Anxiety and depression 06/13/2014  . Cancer of ascending colon ([place]) 06/13/2014  . Small bowel obstruction ([place]) 05/31/2014  . Colovaginal fistula 04/20/2014  . Lethargy 09/15/2013  . Chest pain, musculoskeletal 02/19/2011  . Depression 02/07/2010  . DIASTOLIC HEART FAILURE, CHRONIC 02/27/2009  . PURE HYPERCHOLESTEROLEMIA 04/05/2008  . Coronary atherosclerosis 04/05/2008  . Osteoarthritis 05/19/2007  . Essential hypertension 01/21/2007  . GERD 01/21/2007  . Hiatal hernia 01/21/2007  . Diverticulosis of large intestine 01/21/2007  . COLONIC POLYPS, HX OF 01/21/2007    Past Surgical History:  Procedure Laterality Date  . ABDOMINAL HYSTERECTOMY    . ANTERIOR CERVICAL DECOMP/DISCECTOMY FUSION  01-31-2009   C5 -- 7  . APPENDECTOMY    . Bone spurs Bilateral    Feet  . CARDIOVASCULAR STRESS TEST  02-26-2011   Normal lexiscan no exercise study/  no ischemia/  normal LV function and wall motion, ef 67%  . CARPAL TUNNEL RELEASE Right   . CATARACT EXTRACTION W/ INTRAOCULAR LENS  IMPLANT, BILATERAL Bilateral   . CHOLECYSTECTOMY    . COLOSTOMY N/A 06/02/2014   Procedure: DIVERTING DESCENDING END COLOSTOMY;  Surgeon: Donnie Mesa, MD;  Location: [facility];  Service: General;  Laterality: N/A;  . CORONARY ANGIOPLASTY WITH STENT PLACEMENT  11-04-2007  dr bensimhon   BMS x2 to RCA/  mild Non-obstructive disease LAD/  normal LVF  . DILATION AND CURETTAGE OF UTERUS    . ESOPHAGOGASTRODUODENOSCOPY N/A 10/28/2017   Procedure: ESOPHAGOGASTRODUODENOSCOPY (EGD);  Surgeon: Yetta Flock, MD;  Location: [facility] ENDOSCOPY;  Service: Gastroenterology;  Laterality: N/A;  . EVALUATION UNDER ANESTHESIA WITH ANAL FISTULECTOMY N/A 10/13/2014   Procedure: ANAL EXAM UNDER ANESTHESIA ;  Surgeon: [name], MD;  Location: [facility];  Service: General;  Laterality: N/A;  . HAND SURGERY     Tendon repair  . LAPAROSCOPIC SIGMOID COLECTOMY N/A  11/24/2014   Procedure: SIGMOID COLECTOMY AND COLSOTOMY CLOSURE;  Surgeon: Donnie Mesa, MD;  Location: [place] [facility];  Service: General;  Laterality: N/A;  . LUMBAR LAMINECTOMY  10-29-2002,  1969   Left  L3 -- 4  decompression  . PARTIAL COLECTOMY N/A 06/02/2014   Procedure: RIGHT PARTIAL COLECTOMY ;  Surgeon: Donnie Mesa, MD;  Location: [place];  Service: General;  Laterality: N/A;  . PATELLECTOMY Right 09/14/2013   Procedure: PATELLECTOMY;  Surgeon: Meredith Pel, MD;  Location: [place];  Service: Orthopedics;  Laterality: Right;  . PROCTOSCOPY N/A 10/13/2014   Procedure: RIDGE PROCTOSCOPY;  Surgeon: [name], MD;  Location: [facility];  Service: General;  Laterality: N/A;  . REVISION TOTAL KNEE ARTHROPLASTY Bilateral right  04-02-2011/  left 1996 & 10-05-1999  . ROTATOR CUFF  REPAIR Bilateral   . TONSILLECTOMY    . TOTAL KNEE ARTHROPLASTY Bilateral left 1994/  right 2000  . TOTAL KNEE REVISION Left 08/02/2015   Procedure: LEFT FEMORAL REVISION;  Surgeon: Gaynelle Arabian, MD;  Location: [facility];  Service: Orthopedics;  Laterality: Left;  . TRANSTHORACIC ECHOCARDIOGRAM  12-01-2009   Grade I diastolic dysfunction/  ef 60%/  moderate MR/  mild TR     OB History   No obstetric history on file.     Family History  Problem Relation Age of Onset  . Osteoarthritis Mother   . Heart failure Mother   . Colon cancer Maternal Uncle 78  . Heart Problems Father   . Cancer Sister 65       ? colon cancer   . Kidney cancer Brother 60  . Esophageal cancer Neg Hx   . Rectal cancer Neg Hx   . Stomach cancer Neg Hx   . Pancreatic cancer Neg Hx     Social History   Tobacco Use  . Smoking status: Former Smoker    Packs/day: 0.50    Years: 16.00    Pack years: 8.00    Types: Cigarettes    Start date: 72    Quit date: 07/16/1967    Years since quitting: 52.0  . Smokeless tobacco: Never Used  Substance Use Topics  . Alcohol use: No    Alcohol/week: 0.0 standard drinks  . Drug use: No    Home  Medications Prior to Admission medications   Medication Sig Start Date End Date Taking? Authorizing Provider  acetaminophen (TYLENOL) 325 MG tablet Take 650 mg by mouth every 6 (six) hours as needed for moderate pain.     [provider]  albuterol (PROAIR HFA) 108 (90 Base) MCG/ACT inhaler Inhale 2 puffs into the lungs every 6 (six) hours as needed for wheezing or shortness of breath. 04/05/19   [name], MD  allopurinol (ZYLOPRIM) 100 MG tablet Take 100 mg by mouth daily.    [provider]  apixaban (ELIQUIS) 5 MG TABS tablet Take 1 tablet (5 mg total) by mouth 2 (two) times daily. 04/06/19   Nafziger, [name], NP  Ascorbic Acid (VITAMIN C) 1000 MG tablet Take 1,000 mg by mouth daily.    [provider]  benazepril (LOTENSIN) 40 MG tablet Take 1 tablet (40 mg total) by mouth daily. 04/06/19   Nafziger, [name], NP  cephALEXin (KEFLEX) 500 MG capsule Take 1 capsule (500 mg total) by mouth 4 (four) times daily. 07/28/19   Burchette, Alinda Sierras, MD  colchicine 0.6 MG tablet Take 0.6 mg by mouth daily.    [provider]  diltiazem (CARDIZEM CD) 120 MG 24 hr capsule Take 1 capsule by mouth as needed when Heart Rate is above 027 and Systolic Blood Pressure is above 100 mm/hg Patient taking differently: Take 120 mg by mouth daily.  06/09/19   [name], Wonda Cheng, MD  esomeprazole (NEXIUM) 40 MG capsule Take 1 capsule (40 mg total) by mouth daily. 04/06/19   Nafziger, [name], NP  ferrous sulfate 325 (65 FE) MG EC tablet Take 325 mg by mouth daily with breakfast.    [provider]  fluticasone (FLOVENT HFA) 44 MCG/ACT inhaler Inhale 2 puffs into the lungs 2 (two) times daily. 03/27/18   Lucretia Kern, DO  furosemide (LASIX) 20 MG tablet Take 2 tablets (40 mg total) by mouth daily. Please keep upcoming appt in November with Dr. Acie Fredrickson before anymore refills. Thank you  06/09/19   [name], Wonda Cheng, MD  gabapentin (NEURONTIN) 600 MG tablet Take 1 tablet (600 mg total) by  mouth 2 (two) times daily. 04/06/19   Nafziger, [name], NP  HYDROcodone-acetaminophen (NORCO/VICODIN) 5-325 MG tablet Take 1 tablet by mouth every 6 (six) hours as needed for severe pain. 07/21/19   Suzy Bouchard, PA-C  hydrocortisone (ANUSOL-HC) 2.5 % rectal cream Place 1 application rectally 2 (two) times daily. 07/30/19   [name], MD  LORazepam (ATIVAN) 0.5 MG tablet 1 tablet twice daily as needed for anxiety or sleep Patient taking differently: Take 0.5 mg by mouth 2 (two) times daily as needed for anxiety or sleep.  09/09/18   Nafziger, [name], NP  metoprolol tartrate (LOPRESSOR) 50 MG tablet Take 1.5 tablets (75 mg total) by mouth 2 (two) times daily. 04/06/19 07/10/19  Nafziger, [name], NP  nitroGLYCERIN (NITROSTAT) 0.4 MG SL tablet Place 1 tablet (0.4 mg total) under the tongue every 5 (five) minutes as needed for chest pain (x 3 pills). Reported on 09/01/2015 04/06/19   [name], NP  sertraline (ZOLOFT) 50 MG tablet Take 1 tablet (50 mg total) by mouth daily. 04/06/19   [name], NP  Spacer/Aero-Holding Chambers (E-Z SPACER) inhaler Use as instructed 03/20/18   [name], MD  Tiotropium Bromide Monohydrate (SPIRIVA RESPIMAT) 2.5 MCG/ACT AERS Inhale 2 puffs into the lungs daily. 04/05/19   [name], MD  traZODone (DESYREL) 100 MG tablet TAKE 1/2 (ONE-HALF) TABLET BY MOUTH AT BEDTIME Patient taking differently: Take 50 mg by mouth at bedtime.  03/11/19   Nafziger, [name], NP  triamcinolone cream (KENALOG) 0.1 % Apply 1 application topically 2 (two) times daily as needed (Eczema on legs). 06/08/19   [name], NP    Allergies    Patient has no known allergies.  Review of Systems   Review of Systems  Constitutional: Positive for activity change, appetite change and fever (Subjective).  HENT: Negative.   Respiratory: Negative.  Negative for cough and shortness of breath.   Cardiovascular: Negative.  Negative for chest pain.  Gastrointestinal: Positive for nausea  and vomiting. Negative for abdominal pain.  Musculoskeletal:       See HPI.  Skin: Positive for color change and wound (serosanguinous drainage rt LE).  Neurological: Positive for weakness.    Physical Exam Updated Vital Signs BP 128/75 (BP Location: Right Arm)   Pulse 87   Temp 98.1 F (36.7 C) (Oral)   Resp 19   Ht 5' 3.5" (1.613 m)   Wt 87.5 kg   SpO2 97%   BMI 33.65 kg/m   Physical Exam Vitals and nursing note reviewed.  Constitutional:      Appearance: She is well-developed.  HENT:     Head: Normocephalic.  Cardiovascular:     Rate and Rhythm: Normal rate and regular rhythm.     Heart sounds: No murmur.  Pulmonary:     Effort: Pulmonary effort is normal. No respiratory distress.     Breath sounds: Rales (bilateral bases) present. No wheezing or rhonchi.  Abdominal:     General: Bowel sounds are normal.     Palpations: Abdomen is soft.     Tenderness: There is no abdominal tenderness. There is no guarding or rebound.  Musculoskeletal:        General: Normal range of motion.     Cervical back: Normal range of motion and neck supple.     Comments: Right lower extremity to foot is markedly swollen, erythematous.  There is ecchymosis to 2nd and 3rd toes of the right foot. (ecchymosis is improved compared to 1/6 charted photos). The lower leg to thigh is tender to palpation.   Skin:    General: Skin is warm and dry.     Findings: Erythema present.     Comments: Area to anteromedial distal right LE of blistering with serosanguinous drainage.   Neurological:     Mental Status: She is alert and oriented to person, place, and time.     ED Results / Procedures / Treatments   Labs (all labs ordered are listed, but only abnormal results are displayed) Labs Reviewed - No data to display  EKG None  Radiology No results found.  Procedures Procedures (including critical care time)  Medications Ordered in ED Medications  fentaNYL (SUBLIMAZE) injection 25 mcg (has no  administration in time range)    ED Course  I have reviewed the triage vital signs and the nursing notes.  Pertinent labs & imaging results that were available during my care of the patient were reviewed by me and considered in my medical decision making (see chart for details).    MDM Rules/Calculators/A&P                      Patient to ED with worsening right LE redness, swelling and pain c/w cellulitis, and failure of outpatient treatment with 2 antibiotics.   The patient is nontoxic in appearance, however, is uncomfortable. Pain medications ordered. Labs reviewed. No evidence sepsis.   She has persistent cellulitis of the right lower leg, now with worsening symptoms of pain and swelling. She has failed outpatient treatment with 2 abx and admission is felt appropriate for IV abx and close monitoring for improvement.   Daughter, [name], updated on anticipated admission.    Final Clinical Impression(s) / ED Diagnoses Final diagnoses:  None   1. Cellulitis, right LE  Rx / DC Orders ED Discharge Orders    None       Dennie Bible 08/03/19 [place], April, MD 08/03/19 0510

## 2019-08-03 ENCOUNTER — Other Ambulatory Visit: Payer: Self-pay

## 2019-08-03 DIAGNOSIS — N1831 Chronic kidney disease, stage 3a: Secondary | ICD-10-CM

## 2019-08-03 DIAGNOSIS — I482 Chronic atrial fibrillation, unspecified: Secondary | ICD-10-CM | POA: Diagnosis present

## 2019-08-03 DIAGNOSIS — I5032 Chronic diastolic (congestive) heart failure: Secondary | ICD-10-CM | POA: Diagnosis present

## 2019-08-03 DIAGNOSIS — B965 Pseudomonas (aeruginosa) (mallei) (pseudomallei) as the cause of diseases classified elsewhere: Secondary | ICD-10-CM | POA: Diagnosis present

## 2019-08-03 DIAGNOSIS — M792 Neuralgia and neuritis, unspecified: Secondary | ICD-10-CM

## 2019-08-03 DIAGNOSIS — L03115 Cellulitis of right lower limb: Principal | ICD-10-CM

## 2019-08-03 DIAGNOSIS — Z8673 Personal history of transient ischemic attack (TIA), and cerebral infarction without residual deficits: Secondary | ICD-10-CM | POA: Diagnosis not present

## 2019-08-03 DIAGNOSIS — Z96643 Presence of artificial hip joint, bilateral: Secondary | ICD-10-CM | POA: Diagnosis present

## 2019-08-03 DIAGNOSIS — G4700 Insomnia, unspecified: Secondary | ICD-10-CM | POA: Diagnosis present

## 2019-08-03 DIAGNOSIS — I10 Essential (primary) hypertension: Secondary | ICD-10-CM

## 2019-08-03 DIAGNOSIS — F329 Major depressive disorder, single episode, unspecified: Secondary | ICD-10-CM | POA: Diagnosis present

## 2019-08-03 DIAGNOSIS — Z20822 Contact with and (suspected) exposure to covid-19: Secondary | ICD-10-CM | POA: Diagnosis present

## 2019-08-03 DIAGNOSIS — N390 Urinary tract infection, site not specified: Secondary | ICD-10-CM | POA: Diagnosis present

## 2019-08-03 DIAGNOSIS — M810 Age-related osteoporosis without current pathological fracture: Secondary | ICD-10-CM | POA: Diagnosis present

## 2019-08-03 DIAGNOSIS — Z86010 Personal history of colonic polyps: Secondary | ICD-10-CM | POA: Diagnosis not present

## 2019-08-03 DIAGNOSIS — Z961 Presence of intraocular lens: Secondary | ICD-10-CM | POA: Diagnosis present

## 2019-08-03 DIAGNOSIS — I2510 Atherosclerotic heart disease of native coronary artery without angina pectoris: Secondary | ICD-10-CM | POA: Diagnosis present

## 2019-08-03 DIAGNOSIS — J432 Centrilobular emphysema: Secondary | ICD-10-CM

## 2019-08-03 DIAGNOSIS — K219 Gastro-esophageal reflux disease without esophagitis: Secondary | ICD-10-CM

## 2019-08-03 DIAGNOSIS — I130 Hypertensive heart and chronic kidney disease with heart failure and stage 1 through stage 4 chronic kidney disease, or unspecified chronic kidney disease: Secondary | ICD-10-CM | POA: Diagnosis present

## 2019-08-03 DIAGNOSIS — N183 Chronic kidney disease, stage 3 unspecified: Secondary | ICD-10-CM | POA: Diagnosis present

## 2019-08-03 DIAGNOSIS — L0390 Cellulitis, unspecified: Secondary | ICD-10-CM | POA: Diagnosis present

## 2019-08-03 DIAGNOSIS — I272 Pulmonary hypertension, unspecified: Secondary | ICD-10-CM | POA: Diagnosis present

## 2019-08-03 DIAGNOSIS — Z7901 Long term (current) use of anticoagulants: Secondary | ICD-10-CM | POA: Diagnosis not present

## 2019-08-03 DIAGNOSIS — Z9842 Cataract extraction status, left eye: Secondary | ICD-10-CM | POA: Diagnosis not present

## 2019-08-03 DIAGNOSIS — M109 Gout, unspecified: Secondary | ICD-10-CM | POA: Diagnosis present

## 2019-08-03 DIAGNOSIS — Z85038 Personal history of other malignant neoplasm of large intestine: Secondary | ICD-10-CM | POA: Diagnosis not present

## 2019-08-03 DIAGNOSIS — F5101 Primary insomnia: Secondary | ICD-10-CM

## 2019-08-03 DIAGNOSIS — J449 Chronic obstructive pulmonary disease, unspecified: Secondary | ICD-10-CM | POA: Diagnosis present

## 2019-08-03 DIAGNOSIS — F419 Anxiety disorder, unspecified: Secondary | ICD-10-CM | POA: Diagnosis present

## 2019-08-03 DIAGNOSIS — Z981 Arthrodesis status: Secondary | ICD-10-CM | POA: Diagnosis not present

## 2019-08-03 LAB — CBC WITH DIFFERENTIAL/PLATELET
Abs Immature Granulocytes: 0.02 10*3/uL (ref 0.00–0.07)
Basophils Absolute: 0 10*3/uL (ref 0.0–0.1)
Basophils Relative: 0 %
Eosinophils Absolute: 0.1 10*3/uL (ref 0.0–0.5)
Eosinophils Relative: 1 %
HCT: 35.5 % — ABNORMAL LOW (ref 36.0–46.0)
Hemoglobin: 10.9 g/dL — ABNORMAL LOW (ref 12.0–15.0)
Immature Granulocytes: 0 %
Lymphocytes Relative: 8 %
Lymphs Abs: 0.8 10*3/uL (ref 0.7–4.0)
MCH: 30.4 pg (ref 26.0–34.0)
MCHC: 30.7 g/dL (ref 30.0–36.0)
MCV: 99.2 fL (ref 80.0–100.0)
Monocytes Absolute: 0.9 10*3/uL (ref 0.1–1.0)
Monocytes Relative: 9 %
Neutro Abs: 7.5 10*3/uL (ref 1.7–7.7)
Neutrophils Relative %: 82 %
Platelets: 196 10*3/uL (ref 150–400)
RBC: 3.58 MIL/uL — ABNORMAL LOW (ref 3.87–5.11)
RDW: 14.4 % (ref 11.5–15.5)
WBC: 9.2 10*3/uL (ref 4.0–10.5)
nRBC: 0 % (ref 0.0–0.2)

## 2019-08-03 LAB — COMPREHENSIVE METABOLIC PANEL
ALT: 16 U/L (ref 0–44)
ALT: 18 U/L (ref 0–44)
AST: 23 U/L (ref 15–41)
AST: 26 U/L (ref 15–41)
Albumin: 3.7 g/dL (ref 3.5–5.0)
Albumin: 3.7 g/dL (ref 3.5–5.0)
Alkaline Phosphatase: 55 U/L (ref 38–126)
Alkaline Phosphatase: 57 U/L (ref 38–126)
Anion gap: 10 (ref 5–15)
Anion gap: 9 (ref 5–15)
BUN: 33 mg/dL — ABNORMAL HIGH (ref 8–23)
BUN: 51 mg/dL — ABNORMAL HIGH (ref 8–23)
CO2: 24 mmol/L (ref 22–32)
CO2: 25 mmol/L (ref 22–32)
Calcium: 8.9 mg/dL (ref 8.9–10.3)
Calcium: 9 mg/dL (ref 8.9–10.3)
Chloride: 106 mmol/L (ref 98–111)
Chloride: 106 mmol/L (ref 98–111)
Creatinine, Ser: 1.08 mg/dL — ABNORMAL HIGH (ref 0.44–1.00)
Creatinine, Ser: 1.46 mg/dL — ABNORMAL HIGH (ref 0.44–1.00)
GFR calc Af Amer: 37 mL/min — ABNORMAL LOW (ref >60)
GFR calc Af Amer: 53 mL/min — ABNORMAL LOW (ref >60)
GFR calc non Af Amer: 32 mL/min — ABNORMAL LOW (ref >60)
GFR calc non Af Amer: 46 mL/min — ABNORMAL LOW (ref >60)
Glucose, Bld: 108 mg/dL — ABNORMAL HIGH (ref 70–99)
Glucose, Bld: 180 mg/dL — ABNORMAL HIGH (ref 70–99)
Potassium: 3.6 mmol/L (ref 3.5–5.1)
Potassium: 4.2 mmol/L (ref 3.5–5.1)
Sodium: 139 mmol/L (ref 135–145)
Sodium: 141 mmol/L (ref 135–145)
Total Bilirubin: 0.8 mg/dL (ref 0.3–1.2)
Total Bilirubin: 0.8 mg/dL (ref 0.3–1.2)
Total Protein: 7.3 g/dL (ref 6.5–8.1)
Total Protein: 7.4 g/dL (ref 6.5–8.1)

## 2019-08-03 LAB — CBC
HCT: 36.3 % (ref 36.0–46.0)
Hemoglobin: 11.2 g/dL — ABNORMAL LOW (ref 12.0–15.0)
MCH: 30.9 pg (ref 26.0–34.0)
MCHC: 30.9 g/dL (ref 30.0–36.0)
MCV: 100 fL (ref 80.0–100.0)
Platelets: 189 10*3/uL (ref 150–400)
RBC: 3.63 MIL/uL — ABNORMAL LOW (ref 3.87–5.11)
RDW: 14.5 % (ref 11.5–15.5)
WBC: 8.2 10*3/uL (ref 4.0–10.5)
nRBC: 0 % (ref 0.0–0.2)

## 2019-08-03 LAB — SARS CORONAVIRUS 2 (TAT 6-24 HRS): SARS Coronavirus 2: NEGATIVE

## 2019-08-03 LAB — URINALYSIS, ROUTINE W REFLEX MICROSCOPIC
Bilirubin Urine: NEGATIVE
Glucose, UA: NEGATIVE mg/dL
Hgb urine dipstick: NEGATIVE
Ketones, ur: NEGATIVE mg/dL
Leukocytes,Ua: NEGATIVE
Nitrite: NEGATIVE
Protein, ur: NEGATIVE mg/dL
Specific Gravity, Urine: 1.016 (ref 1.005–1.030)
pH: 5 (ref 5.0–8.0)

## 2019-08-03 LAB — MAGNESIUM: Magnesium: 2.1 mg/dL (ref 1.7–2.4)

## 2019-08-03 LAB — LACTIC ACID, PLASMA
Lactic Acid, Venous: 1.7 mmol/L (ref 0.5–1.9)
Lactic Acid, Venous: 1.9 mmol/L (ref 0.5–1.9)

## 2019-08-03 LAB — BRAIN NATRIURETIC PEPTIDE: B Natriuretic Peptide: 345.2 pg/mL — ABNORMAL HIGH (ref 0.0–100.0)

## 2019-08-03 LAB — POC SARS CORONAVIRUS 2 AG -  ED: SARS Coronavirus 2 Ag: NEGATIVE

## 2019-08-03 MED ORDER — METOPROLOL TARTRATE 50 MG PO TABS
75.0000 mg | ORAL_TABLET | Freq: Two times a day (BID) | ORAL | Status: DC
  Administered 2019-08-03 – 2019-08-07 (×8): 75 mg via ORAL
  Filled 2019-08-03 (×8): qty 1

## 2019-08-03 MED ORDER — HYDROCODONE-ACETAMINOPHEN 5-325 MG PO TABS
1.0000 | ORAL_TABLET | Freq: Four times a day (QID) | ORAL | Status: DC | PRN
  Administered 2019-08-03 – 2019-08-07 (×12): 1 via ORAL
  Filled 2019-08-03 (×12): qty 1

## 2019-08-03 MED ORDER — FUROSEMIDE 40 MG PO TABS
40.0000 mg | ORAL_TABLET | Freq: Every day | ORAL | Status: DC
  Administered 2019-08-03 – 2019-08-07 (×5): 40 mg via ORAL
  Filled 2019-08-03 (×5): qty 1

## 2019-08-03 MED ORDER — APIXABAN 5 MG PO TABS
5.0000 mg | ORAL_TABLET | Freq: Two times a day (BID) | ORAL | Status: DC
  Administered 2019-08-03 – 2019-08-05 (×5): 5 mg via ORAL
  Filled 2019-08-03 (×5): qty 1

## 2019-08-03 MED ORDER — ALBUTEROL SULFATE (2.5 MG/3ML) 0.083% IN NEBU: 2.5000 mg | INHALATION_SOLUTION | Freq: Four times a day (QID) | RESPIRATORY_TRACT | Status: DC | PRN

## 2019-08-03 MED ORDER — ALBUTEROL SULFATE HFA 108 (90 BASE) MCG/ACT IN AERS
2.0000 | INHALATION_SPRAY | Freq: Four times a day (QID) | RESPIRATORY_TRACT | Status: DC | PRN
  Filled 2019-08-03: qty 6.7

## 2019-08-03 MED ORDER — ONDANSETRON 4 MG PO TBDP: 4.0000 mg | ORAL_TABLET | Freq: Once | ORAL | Status: DC

## 2019-08-03 MED ORDER — LORAZEPAM 0.5 MG PO TABS
0.5000 mg | ORAL_TABLET | Freq: Two times a day (BID) | ORAL | Status: DC | PRN
  Administered 2019-08-03 – 2019-08-06 (×3): 0.5 mg via ORAL
  Filled 2019-08-03 (×3): qty 1

## 2019-08-03 MED ORDER — TRAZODONE HCL 50 MG PO TABS
50.0000 mg | ORAL_TABLET | Freq: Every day | ORAL | Status: DC
  Administered 2019-08-03 – 2019-08-06 (×4): 50 mg via ORAL
  Filled 2019-08-03 (×4): qty 1

## 2019-08-03 MED ORDER — BUDESONIDE 0.25 MG/2ML IN SUSP
0.2500 mg | Freq: Two times a day (BID) | RESPIRATORY_TRACT | Status: DC
  Administered 2019-08-03 – 2019-08-07 (×8): 0.25 mg via RESPIRATORY_TRACT
  Filled 2019-08-03 (×8): qty 2

## 2019-08-03 MED ORDER — TIOTROPIUM BROMIDE MONOHYDRATE 2.5 MCG/ACT IN AERS: 2.0000 | INHALATION_SPRAY | Freq: Every day | RESPIRATORY_TRACT | Status: DC

## 2019-08-03 MED ORDER — SERTRALINE HCL 50 MG PO TABS
50.0000 mg | ORAL_TABLET | Freq: Every day | ORAL | Status: DC
  Administered 2019-08-03 – 2019-08-07 (×5): 50 mg via ORAL
  Filled 2019-08-03 (×5): qty 1

## 2019-08-03 MED ORDER — FLUTICASONE PROPIONATE HFA 44 MCG/ACT IN AERO: 2.0000 | INHALATION_SPRAY | Freq: Two times a day (BID) | RESPIRATORY_TRACT | Status: DC

## 2019-08-03 MED ORDER — ACETAMINOPHEN 325 MG PO TABS
650.0000 mg | ORAL_TABLET | Freq: Four times a day (QID) | ORAL | Status: DC | PRN
  Administered 2019-08-04 – 2019-08-05 (×2): 650 mg via ORAL
  Filled 2019-08-03 (×2): qty 2

## 2019-08-03 MED ORDER — VANCOMYCIN HCL IN DEXTROSE 1-5 GM/200ML-% IV SOLN
1000.0000 mg | INTRAVENOUS | Status: DC
  Administered 2019-08-03 – 2019-08-05 (×2): 1000 mg via INTRAVENOUS
  Filled 2019-08-03 (×2): qty 200

## 2019-08-03 MED ORDER — PANTOPRAZOLE SODIUM 40 MG PO TBEC
40.0000 mg | DELAYED_RELEASE_TABLET | Freq: Every day | ORAL | Status: DC
  Administered 2019-08-03 – 2019-08-07 (×5): 40 mg via ORAL
  Filled 2019-08-03 (×5): qty 1

## 2019-08-03 MED ORDER — ONDANSETRON HCL 4 MG/2ML IJ SOLN
4.0000 mg | Freq: Four times a day (QID) | INTRAMUSCULAR | Status: DC | PRN
  Administered 2019-08-03: 4 mg via INTRAVENOUS
  Filled 2019-08-03: qty 2

## 2019-08-03 MED ORDER — UMECLIDINIUM BROMIDE 62.5 MCG/INH IN AEPB
1.0000 | INHALATION_SPRAY | Freq: Every day | RESPIRATORY_TRACT | Status: DC
  Administered 2019-08-03 – 2019-08-07 (×5): 1 via RESPIRATORY_TRACT
  Filled 2019-08-03: qty 7

## 2019-08-03 MED ORDER — BENAZEPRIL HCL 20 MG PO TABS
40.0000 mg | ORAL_TABLET | Freq: Every day | ORAL | Status: DC
  Administered 2019-08-03 – 2019-08-07 (×5): 40 mg via ORAL
  Filled 2019-08-03: qty 4
  Filled 2019-08-03: qty 2
  Filled 2019-08-03 (×2): qty 4
  Filled 2019-08-03 (×3): qty 2
  Filled 2019-08-03: qty 4
  Filled 2019-08-03: qty 2
  Filled 2019-08-03: qty 4

## 2019-08-03 MED ORDER — GABAPENTIN 300 MG PO CAPS
600.0000 mg | ORAL_CAPSULE | Freq: Every day | ORAL | Status: DC
  Administered 2019-08-03 – 2019-08-06 (×4): 600 mg via ORAL
  Filled 2019-08-03 (×4): qty 2

## 2019-08-03 MED ORDER — FENTANYL CITRATE (PF) 100 MCG/2ML IJ SOLN
50.0000 ug | Freq: Once | INTRAMUSCULAR | Status: AC
  Administered 2019-08-03: 50 ug via INTRAVENOUS
  Filled 2019-08-03: qty 2

## 2019-08-03 NOTE — Progress Notes (Signed)
PROGRESS NOTE  [name] [id number] DOB: 1929-12-21 DOA: 08/02/2019 PCP: [name], NP  Brief History   [name] is a 86 y.o. female with PMH HTN, A fib on AC, CHF, Anxiety/Depression, Insomnia, GERD and COPD who presents to ER for worsening redness/swelling/pain of RLE despite two different outpatient antibiotics. Patient reports that a few weeks ago (12/26) she was opening a drawer at home and it hit her RLE and there was just a bruise; denies open wound. Shortly thereafter she began to notice redness and swelling which has continued to worsen despite trying Doxycycline and then Keflex which were prescribed by PCP. Patient reports worsening pain as well and clear drainage. Initially the redness was more localized to below her shin and now is extending towards her knee. Due to the pain, she has not been eating much over the past week. Denies fever or chills. Denies cough, SOB, chest pain, abdominal pain, nausea, vomiting, diarrhea, constipation, dysuria, hematuria, hematochezia, melena, difficulty moving arms/legs, speech difficulty, trouble eating, confusion or any other complaints. Reports compliance with home medications which were reviewed at bedside with patient.  In the ED: Vitals stable with exception of elevated blood pressure. Labs remarkable for: Lactate WNL, WBC 7.2, glucose 180, Cr 1.46, BNP 345. UA collected. Patient started on Vancomycin and given IV Fentanyl. ER provider called for admission.  The patient has been admitted to a telemetry bed. She is receiving IV Rocephin.   Consultants  . None  Procedures  . None  Antibiotics   Anti-infectives (From admission, onward)   Start     Dose/Rate Route Frequency Ordered Stop   08/03/19 1400  vancomycin (VANCOCIN) IVPB 1000 mg/200 mL premix     1,000 mg 200 mL/hr over 60 Minutes Intravenous Every 48 hours 08/03/19 0144     08/03/19 0000  vancomycin (VANCOCIN) IVPB 1000 mg/200 mL premix     1,000 mg 200 mL/hr over 60  Minutes Intravenous  Once 08/02/19 2349 08/03/19 0145    .  Subjective  The patient is resting. She is complaining of pain in her right leg.   Objective   Vitals:  Vitals:   08/03/19 1200 08/03/19 1258  BP: (!) 149/94 (!) 145/84  Pulse: 87 93  Resp: 16 19  Temp:  98.6 F (37 C)  SpO2: 94% 98%   Exam:  Constitutional:  . The patient is awake, alert, and oriented x 3. No acute distress. Respiratory:  . No increased work of breathing. . No wheezes, rales, or rhonchi . No tactile fremitus Cardiovascular:  . Regular rate and rhythm . No murmurs, ectopy, or gallups. . No lateral PMI. No thrills. Abdomen:  . Abdomen is soft, non-tender, non-distended . No hernias, masses, or organomegaly . Normoactive bowel sounds.  Musculoskeletal:  . No cyanosis, clubbing, or edema Skin:  . No rashes, lesions, ulcers . palpation of skin: no induration or nodules . Right lower extremity is warm, tender to touch and swollen.  Neurologic:  . CN 2-12 intact . Sensation all 4 extremities intact Psychiatric:  . Mental status o Mood, affect appropriate o Orientation to person, place, time  . judgment and insight appear intact  I have personally reviewed the following:   Today's Data  . Vitals, CMP, CBC  Micro Data  . COVID-19 negative.  Scheduled Meds: . apixaban  5 mg Oral BID  . benazepril  40 mg Oral Daily  . budesonide  0.25 mg Nebulization BID  . furosemide  40 mg Oral Daily  .  gabapentin  600 mg Oral QHS  . metoprolol tartrate  75 mg Oral BID  . pantoprazole  40 mg Oral Daily  . sertraline  50 mg Oral Daily  . traZODone  50 mg Oral QHS  . umeclidinium bromide  1 puff Inhalation Daily   Continuous Infusions: . vancomycin Stopped (08/03/19 1523)    Principal Problem:   Cellulitis Active Problems:   Depression   Essential hypertension   Coronary atherosclerosis   DIASTOLIC HEART FAILURE, CHRONIC   GERD   Neuropathic pain   Insomnia   Chronic atrial  fibrillation (HCC)   Chronic anticoagulation   Centrilobular emphysema (HCC)   CKD (chronic kidney disease), stage III   LOS: 0 days   A & P  1. Cellulitis: Worsening cellulitis despite outpatient therapy trial of Doxycycline and then Keflex. Doppler on 07/21/2019 negative for DVT. The patient is receiving IV Vancomycin. I will add zosyn to this for better coverage of gram negatives. Blood cultures x 2 have been drawn and have had no growth. She is receiving Norco for pain.  CHF: Last echocardiogram was 10/2017. EF was 55-60%. No evaluation of diastolic dysfunction was possible due to atrial fibrillation. The left atrium was severely dilated. There was also evidence of moderate pulmonary hypertension. Will check another echocardiogram. She takes lasix, metoprolol, and benazapril at home. These have all been continued.  Atrial Fibrillation: EKG on 07/21/2019 demonstrated atrial fibrillation with a controlled rate. She takes elquis at home for anticoagulation, and her rate is controlled with diltiazem and metoprolol. Diltiazem has been held due to low blood pressures, but metoprolol and Eliquis have been continued.  Essential HTN: Blood pressures are under fair control on Metoprolol, lasix, benazapril. Monitor.  Anxiety/Depression: Continue PTA Zoloft, Trazodone, and PRN Ativan.  Neuropathic pain: Continue PTA Gabapentin although it has been reduced by half to qhs on admission considering baseline renal function.  COPD: Continue PTA inhalers  CKD Stage 3: Creatinine is at baseline. Avoid nephrotoxic agents, renally dose medications, and avoid hypotension. Monitor creatinine, electrolytes, and volume status.  I have seen and examined this patient myself. I have spent 35 minutes in her evaluation and care.  Code Status: Full Code; d/w patient DVT Prophylaxis: Chronic AC; Eliquis  Family Communication: ER provider spoke with daughter, [name]. Disposition Plan: Admit to [facility]. Patient  requires IV abx and is high risk for decompensation considering age and co-morbidities. Anticipate discharge within next 3-4 days. Phillip Maffei, DO [facility] Direct contact: see [email or web address]  7PM-7AM contact night coverage as above 08/03/2019, 7:13 PM  LOS: 0 days

## 2019-08-03 NOTE — Progress Notes (Signed)
Pharmacy Antibiotic Note  [name] is a 86 y.o. female admitted on 08/02/2019 with cellulitis.  Pharmacy has been consulted for vancomycin dosing.  Plan: Vancomycin 1 Gm IV q48h for est AUC = 487 Use scr = 1.46 and Vd = 0.5 Goal AUC = 400-550 F/u cultures/levels/scr  Height: 5' 3.5" (161.3 cm) Weight: 193 lb (87.5 kg) IBW/kg (Calculated) : 53.55  Temp (24hrs), Avg:98.1 F (36.7 C), Min:98.1 F (36.7 C), Max:98.2 F (36.8 C)  Recent Labs  Lab 08/02/19 2338  WBC 7.2  CREATININE 1.46*  LATICACIDVEN 1.9    Estimated Creatinine Clearance: 28.8 mL/min (A) (by C-G formula based on SCr of 1.46 mg/dL (H)).    No Known Allergies  Antimicrobials this admission: 1/19 vancomycin >>    >>   Dose adjustments this admission:   Microbiology results:  BCx:   UCx:    Sputum:    MRSA PCR:   Thank you for allowing pharmacy to be a part of this patient's care.  Dorrene German 08/03/2019 1:40 AM

## 2019-08-03 NOTE — Progress Notes (Signed)
Called to get report on the patient coming to 1607 at 1206. Was placed on hold. Will wait for a returned phone call from RN.

## 2019-08-03 NOTE — ED Notes (Signed)
purewick placed on patient.  

## 2019-08-03 NOTE — ED Notes (Signed)
Pt aware we need an urine sample.

## 2019-08-03 NOTE — ED Notes (Signed)
Pt's IV assessed, extension added, and redressed.  Warm blanket provided.  Plan of care discussed.

## 2019-08-03 NOTE — H&P (Signed)
[facility] History and Physical  [place] Winslow Oldaker AUQ:[id number] DOB: 1930/02/16 DOA: 08/02/2019  Referring physician: Charlann Lange, PA - ED provider PCP: [name], NP   Chief Complaint: Right leg swelling and pain  HPI: [name] is a 86 y.o. female with PMH HTN, A fib on AC, CHF, Anxiety/Depression, Insomnia, GERD and COPD who presents to ER for worsening redness/swelling/pain of RLE despite two different outpatient antibiotics. Patient reports that a few weeks ago (12/26) she was opening a drawer at home and it hit her RLE and there was just a bruise; denies open wound. Shortly thereafter she began to notice redness and swelling which has continued to worsen despite trying Doxycycline and then Keflex which were prescribed by PCP. Patient reports worsening pain as well and clear drainage. Initially the redness was more localized to below her shin and now is extending towards her knee. Due to the pain, she has not been eating much over the past week. Denies fever or chills. Denies cough, SOB, chest pain, abdominal pain, nausea, vomiting, diarrhea, constipation, dysuria, hematuria, hematochezia, melena, difficulty moving arms/legs, speech difficulty, trouble eating, confusion or any other complaints. Reports compliance with home medications which were reviewed at bedside with patient.  In the ED: Vitals stable with exception of elevated blood pressure. Labs remarkable for: Lactate WNL, WBC 7.2, glucose 180, Cr 1.46, BNP 345. UA collected. Patient started on Vancomycin and given IV Fentanyl. ER provider called for admission.  Review of Systems:  Constitutional:  No weight loss, night sweats, Fevers, chills, fatigue.  HEENT:  No headaches, Difficulty swallowing,Tooth/dental problems,Sore throat,  No sneezing, itching, ear ache, nasal congestion, post nasal drip,  Cardio-vascular:  No chest pain, Orthopnea, PND, swelling in lower extremities, anasarca, dizziness, palpitations  GI:    No heartburn, indigestion, abdominal pain, nausea, vomiting, diarrhea, change in bowel habits Reports decreased PO intake over last week due to pain. Resp:  No shortness of breath with exertion or at rest. No excess mucus, no productive cough, No non-productive cough, No coughing up of blood.No change in color of mucus.No wheezing.No chest wall deformity  Skin:  Redness, swelling, drainage and pain of RLE. GU:  no dysuria, change in color of urine, no urgency or frequency. No flank pain.  Musculoskeletal:  No joint pain or swelling. No decreased range of motion. No back pain.  Psych:  No change in mood or affect. No depression or anxiety. No memory loss.   Past Medical History:  Diagnosis Date  . Adenocarcinoma, colon [facility]) Oncologist-- Dr Truitt Merle   Multifocal (2) Colon cancer @ ileocecal valve and ascending ( [id number]), Stage IIIB, grade I, MMR normal , 2 of 16 +lymph nodes, negative surgical margins---  06-02-2014  Right hemicolectomy w/ colostomy  . Allergy   . Anxiety   . Asthma    inhaler "sometimes"  . CAD (coronary artery disease)    a. LHC 4/09: pLAD 40, mDx 70-75, pLCx 30, mLCx 50, mRCA 90, EF 60% >> PCI: BMS x2 to RCA;  b. Myoview 8/12: normal  . Cataract    bil cateracts removed  . Chronic anemia   . Chronic diastolic CHF (congestive heart failure) (HCC)    a. Echo 912 - Mild LVH, EF 55-60%, no RWMA, Gr 1 DD, mild MR, mild LAE, mild RAE, PASP 59 mmHg (mod to severe pulmo HTN)  . Clotting disorder (HCC)    hx of dvt, tia on eliquis  . Colostomy in place [facility])    s/p colostomy  takedown 5/16  . Colovaginal fistula    s/p colostomy >> colostomy takedown 5/16  . COPD with chronic bronchitis ([place])   . Depression   . Dysrhythmia    A fib  . Essential hypertension   . GERD (gastroesophageal reflux disease)   . History of adenomatous polyp of colon   . History of blood transfusion   . History of DVT of lower extremity   . History of hiatal hernia   . History of TIA  (transient ischemic attack)    a. Head CT 6/15: small chronic lacunar infarct in thalamus  . Hyperlipidemia   . OA (osteoarthritis)   . Osteoporosis   . Peripheral neuropathy   . Pneumonia   . Pulmonary HTN ([place])    a. PASP on Echo in 9/12:  59 mmHg  . Renal insufficiency   . Sigmoid diverticulosis    s/p sigmoid colectomy  . Stroke ([place])    TIAs  . Wears dentures   . Wears glasses    Past Surgical History:  Procedure Laterality Date  . ABDOMINAL HYSTERECTOMY    . ANTERIOR CERVICAL DECOMP/DISCECTOMY FUSION  01-31-2009   C5 -- 7  . APPENDECTOMY    . Bone spurs Bilateral    Feet  . CARDIOVASCULAR STRESS TEST  02-26-2011   Normal lexiscan no exercise study/  no ischemia/  normal LV function and wall motion, ef 67%  . CARPAL TUNNEL RELEASE Right   . CATARACT EXTRACTION W/ INTRAOCULAR LENS  IMPLANT, BILATERAL Bilateral   . CHOLECYSTECTOMY    . COLOSTOMY N/A 06/02/2014   Procedure: DIVERTING DESCENDING END COLOSTOMY;  Surgeon: Donnie Mesa, MD;  Location: [place];  Service: General;  Laterality: N/A;  . CORONARY ANGIOPLASTY WITH STENT PLACEMENT  11-04-2007  dr bensimhon   BMS x2 to RCA/  mild Non-obstructive disease LAD/  normal LVF  . DILATION AND CURETTAGE OF UTERUS    . ESOPHAGOGASTRODUODENOSCOPY N/A 10/28/2017   Procedure: ESOPHAGOGASTRODUODENOSCOPY (EGD);  Surgeon: Yetta Flock, MD;  Location: [facility] ENDOSCOPY;  Service: Gastroenterology;  Laterality: N/A;  . EVALUATION UNDER ANESTHESIA WITH ANAL FISTULECTOMY N/A 10/13/2014   Procedure: ANAL EXAM UNDER ANESTHESIA ;  Surgeon: [name], MD;  Location: [facility];  Service: General;  Laterality: N/A;  . HAND SURGERY     Tendon repair  . LAPAROSCOPIC SIGMOID COLECTOMY N/A 11/24/2014   Procedure: SIGMOID COLECTOMY AND COLSOTOMY CLOSURE;  Surgeon: Donnie Mesa, MD;  Location: [place];  Service: General;  Laterality: N/A;  . LUMBAR LAMINECTOMY  10-29-2002,  1969   Left  L3 -- 4  decompression  . PARTIAL COLECTOMY N/A 06/02/2014     Procedure: RIGHT PARTIAL COLECTOMY ;  Surgeon: Donnie Mesa, MD;  Location: [place];  Service: General;  Laterality: N/A;  . PATELLECTOMY Right 09/14/2013   Procedure: PATELLECTOMY;  Surgeon: Meredith Pel, MD;  Location: [place];  Service: Orthopedics;  Laterality: Right;  . PROCTOSCOPY N/A 10/13/2014   Procedure: RIDGE PROCTOSCOPY;  Surgeon: [name], MD;  Location: [facility];  Service: General;  Laterality: N/A;  . REVISION TOTAL KNEE ARTHROPLASTY Bilateral right  04-02-2011/  left 1996 & 10-05-1999  . ROTATOR CUFF REPAIR Bilateral   . TONSILLECTOMY    . TOTAL KNEE ARTHROPLASTY Bilateral left 1994/  right 2000  . TOTAL KNEE REVISION Left 08/02/2015   Procedure: LEFT FEMORAL REVISION;  Surgeon: Gaynelle Arabian, MD;  Location: [facility];  Service: Orthopedics;  Laterality: Left;  . TRANSTHORACIC ECHOCARDIOGRAM  12-01-2009   Grade I diastolic  dysfunction/  ef 60%/  moderate MR/  mild TR   Social History:  reports that she quit smoking about 52 years ago. Her smoking use included cigarettes. She started smoking about 68 years ago. She has a 8.00 pack-year smoking history. She has never used smokeless tobacco. She reports that she does not drink alcohol or use drugs.  No Known Allergies  Family History  Problem Relation Age of Onset  . Osteoarthritis Mother   . Heart failure Mother   . Colon cancer Maternal Uncle 42  . Heart Problems Father   . Cancer Sister 45       ? colon cancer   . Kidney cancer Brother 73  . Esophageal cancer Neg Hx   . Rectal cancer Neg Hx   . Stomach cancer Neg Hx   . Pancreatic cancer Neg Hx     Prior to Admission medications   Medication Sig Start Date End Date Taking? Authorizing Provider  acetaminophen (TYLENOL) 325 MG tablet Take 650 mg by mouth every 6 (six) hours as needed for moderate pain.     [provider]  albuterol (PROAIR HFA) 108 (90 Base) MCG/ACT inhaler Inhale 2 puffs into the lungs every 6 (six) hours as needed for wheezing or  shortness of breath. 04/05/19   [name], MD  allopurinol (ZYLOPRIM) 100 MG tablet Take 100 mg by mouth daily.    [provider]  apixaban (ELIQUIS) 5 MG TABS tablet Take 1 tablet (5 mg total) by mouth 2 (two) times daily. 04/06/19   Nafziger, [name], NP  Ascorbic Acid (VITAMIN C) 1000 MG tablet Take 1,000 mg by mouth daily.    [provider]  benazepril (LOTENSIN) 40 MG tablet Take 1 tablet (40 mg total) by mouth daily. 04/06/19   Nafziger, [name], NP  cephALEXin (KEFLEX) 500 MG capsule Take 1 capsule (500 mg total) by mouth 4 (four) times daily. 07/28/19   Burchette, Alinda Sierras, MD  colchicine 0.6 MG tablet Take 0.6 mg by mouth daily.    [provider]  diltiazem (CARDIZEM CD) 120 MG 24 hr capsule Take 1 capsule by mouth as needed when Heart Rate is above 268 and Systolic Blood Pressure is above 100 mm/hg Patient taking differently: Take 120 mg by mouth daily.  06/09/19   [name], Wonda Cheng, MD  esomeprazole (NEXIUM) 40 MG capsule Take 1 capsule (40 mg total) by mouth daily. 04/06/19   Nafziger, [name], NP  ferrous sulfate 325 (65 FE) MG EC tablet Take 325 mg by mouth daily with breakfast.    [provider]  fluticasone (FLOVENT HFA) 44 MCG/ACT inhaler Inhale 2 puffs into the lungs 2 (two) times daily. 03/27/18   Lucretia Kern, DO  furosemide (LASIX) 20 MG tablet Take 2 tablets (40 mg total) by mouth daily. Please keep upcoming appt in November with Dr. Acie Fredrickson before anymore refills. Thank you 06/09/19   [name], Wonda Cheng, MD  gabapentin (NEURONTIN) 600 MG tablet Take 1 tablet (600 mg total) by mouth 2 (two) times daily. 04/06/19   Nafziger, [name], NP  HYDROcodone-acetaminophen (NORCO/VICODIN) 5-325 MG tablet Take 1 tablet by mouth every 6 (six) hours as needed for severe pain. 07/21/19   Suzy Bouchard, PA-C  hydrocortisone (ANUSOL-HC) 2.5 % rectal cream Place 1 application rectally 2 (two) times daily. 07/30/19   [name], MD  LORazepam (ATIVAN) 0.5 MG tablet 1  tablet twice daily as needed for anxiety or sleep Patient taking differently: Take 0.5 mg by  mouth 2 (two) times daily as needed for anxiety or sleep.  09/09/18   Nafziger, [name], NP  metoprolol tartrate (LOPRESSOR) 50 MG tablet Take 1.5 tablets (75 mg total) by mouth 2 (two) times daily. 04/06/19 07/10/19  Nafziger, [name], NP  nitroGLYCERIN (NITROSTAT) 0.4 MG SL tablet Place 1 tablet (0.4 mg total) under the tongue every 5 (five) minutes as needed for chest pain (x 3 pills). Reported on 09/01/2015 04/06/19   [name], NP  sertraline (ZOLOFT) 50 MG tablet Take 1 tablet (50 mg total) by mouth daily. 04/06/19   [name], NP  Spacer/Aero-Holding Chambers (E-Z SPACER) inhaler Use as instructed 03/20/18   [name], MD  Tiotropium Bromide Monohydrate (SPIRIVA RESPIMAT) 2.5 MCG/ACT AERS Inhale 2 puffs into the lungs daily. 04/05/19   [name], MD  traZODone (DESYREL) 100 MG tablet TAKE 1/2 (ONE-HALF) TABLET BY MOUTH AT BEDTIME Patient taking differently: Take 50 mg by mouth at bedtime.  03/11/19   Nafziger, [name], NP  triamcinolone cream (KENALOG) 0.1 % Apply 1 application topically 2 (two) times daily as needed (Eczema on legs). 06/08/19   [name], NP   Physical Exam: Vitals:   08/02/19 2051 08/02/19 2053 08/02/19 2054 08/02/19 2256  BP: (!) 182/99 (!) 165/106 (!) 151/94 128/75  Pulse: 81   87  Resp: 15   19  Temp: 98.1 F (36.7 C)   98.1 F (36.7 C)  TempSrc: Oral   Oral  SpO2: 100%   97%  Weight:      Height:        Wt Readings from Last 3 Encounters:  08/02/19 87.5 kg  07/30/19 87.5 kg  07/28/19 87.1 kg    General:  Appears calm and comfortable Eyes: EOMI, normal lids, irises & conjunctiva ENT: grossly normal hearing Neck: no LAD Cardiovascular: Irregularly irregular rhythm, no m/r/g. LLE without edema. Respiratory: CTA bilaterally, no w/r/r. Normal respiratory effort. Abdomen: soft, ntnd Musculoskeletal: grossly normal tone BUE/BLE Psychiatric: grossly  normal mood and affect, speech fluent and appropriate Neurologic: grossly non-focal             Labs on Admission:  Basic Metabolic Panel: Recent Labs  Lab 08/02/19 2338  NA 139  K 3.6  CL 106  CO2 24  GLUCOSE 180*  BUN 51*  CREATININE 1.46*  CALCIUM 8.9   Liver Function Tests: Recent Labs  Lab 08/02/19 2338  AST 23  ALT 16  ALKPHOS 55  BILITOT 0.8  PROT 7.3  ALBUMIN 3.7   No results for input(s): LIPASE, AMYLASE in the last 168 hours. No results for input(s): AMMONIA in the last 168 hours. CBC: Recent Labs  Lab 08/02/19 2338  WBC 7.2  NEUTROABS 5.2  HGB 11.6*  HCT 37.5  MCV 99.5  PLT 182   Cardiac Enzymes: No results for input(s): CKTOTAL, CKMB, CKMBINDEX, TROPONINI in the last 168 hours.  BNP (last 3 results) Recent Labs    07/21/19 1613 08/02/19 2338  BNP 354.5* 345.2*    ProBNP (last 3 results) No results for input(s): PROBNP in the last 8760 hours.  CBG: No results for input(s): GLUCAP in the last 168 hours.  Radiological Exams on Admission: DG Chest Portable 1 View  Result Date: 08/03/2019 CLINICAL DATA:  Shortness of breath EXAM: PORTABLE CHEST 1 VIEW COMPARISON:  07/21/2019 FINDINGS: The heart size and mediastinal contours are within normal limits. Both lungs are clear. The visualized skeletal structures are unremarkable. IMPRESSION: No active disease. Electronically Signed   By: Lennette Bihari  [place] M.D.   On: 08/03/2019 00:15    EKG: None  Assessment/Plan Principal Problem:   Cellulitis Active Problems:   Depression   Essential hypertension   Coronary atherosclerosis   DIASTOLIC HEART FAILURE, CHRONIC   GERD   Neuropathic pain   Insomnia   Chronic atrial fibrillation (HCC)   Chronic anticoagulation   Centrilobular emphysema ([name])  86 y.o. female with PMH HTN, A fib on AC, CHF, Anxiety/Depression, Insomnia, GERD and COPD who presents to ER for worsening redness/swelling/pain of RLE despite two different outpatient antibiotics.    1. Cellulitis -worsening cellulitis despite outpatient therapy trial of Doxycycline and then Keflex -see image in chart or as above under exam -Vancomycin started in ED and continued on admission -Blood cx ordered -Afebrile and WBC, lactate stable on admission  -Norco for pain -Daily CBC  2. CHF, Afib, HTN -BMP at baseline on admission and LLE without edema -cont PTA medications: Lasix, Metoprolol, Eliquis, Benazepril  3. Anxiety/Depression -cont PTA Zoloft, Trazodone, PRN Ativan  4. Neuropathic pain -cont PTA Gabapentin; halved to qhs on admission considering baseline renal function  5. COPD -cont PTA inhalers  6. CKD Stage 3 -Cr function at baseline -Renally dose medications and avoid nephrotoxic agents -Daily CMP  Code Status: Full Code; d/w patient DVT Prophylaxis: Chronic AC; Eliquis  Family Communication: ER provider spoke with daughter, [name]. Disposition Plan: Admit to Med-Surge. Patient requires IV abx and is high risk for decompensation considering age and co-morbidities. Anticipate discharge within next 3-4 days.  Time spent: 38 min  [facility] Pager [phone]

## 2019-08-03 NOTE — Patient Outreach (Signed)
  [facility]) Care Management Chronic Special Needs Program    08/03/2019  Name: [name], DOB: Kley 02, 1931  MRN: [id number]   [name] is enrolled in a chronic special needs plan for Heart Failure. Client admitted to [facility] on 08/02/19 with cellulitis of right lower extremity.  Individualized care plan sent to [facility] for admission  [facility] will continue to follow.  Peter Garter RN, Jackquline Denmark, CDE Chronic Care Management Coordinator [facility] Care Management [phone]

## 2019-08-04 ENCOUNTER — Ambulatory Visit: Payer: HMO | Admitting: Adult Health

## 2019-08-04 LAB — BASIC METABOLIC PANEL
Anion gap: 8 (ref 5–15)
BUN: 31 mg/dL — ABNORMAL HIGH (ref 8–23)
CO2: 26 mmol/L (ref 22–32)
Calcium: 8.6 mg/dL — ABNORMAL LOW (ref 8.9–10.3)
Chloride: 105 mmol/L (ref 98–111)
Creatinine, Ser: 1.2 mg/dL — ABNORMAL HIGH (ref 0.44–1.00)
GFR calc Af Amer: 47 mL/min — ABNORMAL LOW (ref >60)
GFR calc non Af Amer: 41 mL/min — ABNORMAL LOW (ref >60)
Glucose, Bld: 105 mg/dL — ABNORMAL HIGH (ref 70–99)
Potassium: 4.1 mmol/L (ref 3.5–5.1)
Sodium: 139 mmol/L (ref 135–145)

## 2019-08-04 MED ORDER — SODIUM CHLORIDE 0.9 % IV SOLN
1.0000 g | INTRAVENOUS | Status: DC
  Administered 2019-08-04: 1 g via INTRAVENOUS
  Filled 2019-08-04: qty 1

## 2019-08-04 MED ORDER — SODIUM CHLORIDE 0.9 % IV SOLN
1.0000 g | Freq: Two times a day (BID) | INTRAVENOUS | Status: DC
  Administered 2019-08-04 – 2019-08-05 (×2): 1 g via INTRAVENOUS
  Filled 2019-08-04 (×3): qty 1

## 2019-08-04 NOTE — Progress Notes (Signed)
PROGRESS NOTE  [name] [id number] DOB: Sep 05, 1929 DOA: 08/02/2019 PCP: [name], NP  Brief History   [name] is a 86 y.o. female with PMH HTN, A fib on AC, CHF, Anxiety/Depression, Insomnia, GERD and COPD who presents to ER for worsening redness/swelling/pain of RLE despite two different outpatient antibiotics. Patient reports that a few weeks ago (12/26) she was opening a drawer at home and it hit her RLE and there was just a bruise; denies open wound. Shortly thereafter she began to notice redness and swelling which has continued to worsen despite trying Doxycycline and then Keflex which were prescribed by PCP. Patient reports worsening pain as well and clear drainage. Initially the redness was more localized to below her shin and now is extending towards her knee. Due to the pain, she has not been eating much over the past week. Denies fever or chills. Denies cough, SOB, chest pain, abdominal pain, nausea, vomiting, diarrhea, constipation, dysuria, hematuria, hematochezia, melena, difficulty moving arms/legs, speech difficulty, trouble eating, confusion or any other complaints. Reports compliance with home medications which were reviewed at bedside with patient.  In the ED: Vitals stable with exception of elevated blood pressure. Labs remarkable for: Lactate WNL, WBC 7.2, glucose 180, Cr 1.46, BNP 345. UA collected. Patient started on Vancomycin and given IV Fentanyl. ER provider called for admission.  The patient has been admitted to a telemetry bed. Urine culture has grown out pseudomonas aeruginosa. Her Rochephin has been changed to cefepime for better coverage pending sensitivities.  Consultants  . None  Procedures  . None  Antibiotics   Anti-infectives (From admission, onward)   Start     Dose/Rate Route Frequency Ordered Stop   08/04/19 1645  ceFEPIme (MAXIPIME) 1 g in sodium chloride 0.9 % 100 mL IVPB     1 g 200 mL/hr over 30 Minutes Intravenous Every 8 hours  08/04/19 1634     08/04/19 0900  cefTRIAXone (ROCEPHIN) 1 g in sodium chloride 0.9 % 100 mL IVPB  Status:  Discontinued     1 g 200 mL/hr over 30 Minutes Intravenous Every 24 hours 08/04/19 0856 08/04/19 1634   08/03/19 1400  vancomycin (VANCOCIN) IVPB 1000 mg/200 mL premix     1,000 mg 200 mL/hr over 60 Minutes Intravenous Every 48 hours 08/03/19 0144     08/03/19 0000  vancomycin (VANCOCIN) IVPB 1000 mg/200 mL premix     1,000 mg 200 mL/hr over 60 Minutes Intravenous  Once 08/02/19 2349 08/03/19 0145     Subjective  The patient is resting. She states that she is feeling better. No further nausea/vomiting, although she doesn't have a great appetite.  Objective   Vitals:  Vitals:   08/04/19 0754 08/04/19 1240  BP:  110/63  Pulse:  81  Resp:  18  Temp:  99 F (37.2 C)  SpO2: 92% 92%   Exam:  Constitutional:  . The patient is awake, alert, and oriented x 3. No acute distress. Respiratory:  . No increased work of breathing. . No wheezes, rales, or rhonchi . No tactile fremitus Cardiovascular:  . Regular rate and rhythm . No murmurs, ectopy, or gallups. . No lateral PMI. No thrills. Abdomen:  . Abdomen is soft, non-tender, non-distended . No hernias, masses, or organomegaly . Normoactive bowel sounds.  Musculoskeletal:  . No cyanosis, clubbing, or edema Skin:  . No rashes, lesions, ulcers . palpation of skin: no induration or nodules . Right lower extremity is warm and swollen, but much  less tender to touch.   Neurologic:  . CN 2-12 intact . Sensation all 4 extremities intact Psychiatric:  . Mental status o Mood, affect appropriate o Orientation to person, place, time  . judgment and insight appear intact  I have personally reviewed the following:   Today's Data  . Vitals, BMP, CBC  Micro Data  . COVID-19 negative . Urine culture has grown out pseudomonas aeruginosa  Scheduled Meds: . apixaban  5 mg Oral BID  . benazepril  40 mg Oral Daily  .  budesonide  0.25 mg Nebulization BID  . furosemide  40 mg Oral Daily  . gabapentin  600 mg Oral QHS  . metoprolol tartrate  75 mg Oral BID  . ondansetron  4 mg Oral Once  . pantoprazole  40 mg Oral Daily  . sertraline  50 mg Oral Daily  . traZODone  50 mg Oral QHS  . umeclidinium bromide  1 puff Inhalation Daily   Continuous Infusions: . ceFEPime (MAXIPIME) IV    . vancomycin Stopped (08/03/19 1523)    Principal Problem:   Cellulitis Active Problems:   Depression   Essential hypertension   Coronary atherosclerosis   DIASTOLIC HEART FAILURE, CHRONIC   GERD   Neuropathic pain   Insomnia   Chronic atrial fibrillation (HCC)   Chronic anticoagulation   Centrilobular emphysema (HCC)   CKD (chronic kidney disease), stage III   LOS: 1 day   A & P  1. Cellulitis: Worsening cellulitis despite outpatient therapy trial of Doxycycline and then Keflex. Doppler on 07/21/2019 negative for DVT. The patient is receiving IV Vancomycin. I will add cefepime to this for better coverage of gram negatives. Blood cultures x 2 have been drawn and have had no growth. She is receiving Norco for pain.  UTI: Although urinalysis was not impressive for UTI, her urine culture has grown out pseudomonas aeruginosa. I have changed Rocephin to cefepime for this.  CHF: Last echocardiogram was 10/2017. EF was 55-60%. No evaluation of diastolic dysfunction was possible due to atrial fibrillation. The left atrium was severely dilated. There was also evidence of moderate pulmonary hypertension. Will check another echocardiogram. She takes lasix, metoprolol, and benazapril at home. These have all been continued.  Atrial Fibrillation: EKG on 07/21/2019 demonstrated atrial fibrillation with a controlled rate. She takes elquis at home for anticoagulation, and her rate is controlled with diltiazem and metoprolol. Diltiazem has been held due to low blood pressures, but metoprolol and Eliquis have been continued.  Essential  HTN: Blood pressures are under fair control on Metoprolol, lasix, benazapril. Monitor.  Anxiety/Depression: Continue PTA Zoloft, Trazodone, and PRN Ativan.  Neuropathic pain: Continue PTA Gabapentin although it has been reduced by half to qhs on admission considering baseline renal function.  COPD: Continue PTA inhalers  CKD Stage 3: Creatinine is at baseline. Avoid nephrotoxic agents, renally dose medications, and avoid hypotension. Monitor creatinine, electrolytes, and volume status.  I have seen and examined this patient myself. I have spent 32 minutes in her evaluation and care.  Code Status: Full Code; d/w patient DVT Prophylaxis: Chronic AC; Eliquis  Family Communication: The patient has asked me to call her daughter, [name].  Disposition Plan: Admit to Med-Surge. Patient requires IV abx and is high risk for decompensation considering age and co-morbidities. Anticipate discharge within next 3-4 days. Kiaria Quinnell, DO [facility] Direct contact: see [email or web address]  7PM-7AM contact night coverage as above 08/04/2019, 4:39 PM  LOS: 0 days

## 2019-08-05 DIAGNOSIS — M109 Gout, unspecified: Secondary | ICD-10-CM

## 2019-08-05 LAB — URINE CULTURE: Culture: 20000 — AB

## 2019-08-05 LAB — CREATININE, SERUM
Creatinine, Ser: 1.72 mg/dL — ABNORMAL HIGH (ref 0.44–1.00)
GFR calc Af Amer: 30 mL/min — ABNORMAL LOW (ref >60)
GFR calc non Af Amer: 26 mL/min — ABNORMAL LOW (ref >60)

## 2019-08-05 MED ORDER — APIXABAN 2.5 MG PO TABS
2.5000 mg | ORAL_TABLET | Freq: Two times a day (BID) | ORAL | Status: DC
  Administered 2019-08-05 – 2019-08-06 (×2): 2.5 mg via ORAL
  Filled 2019-08-05 (×2): qty 1

## 2019-08-05 MED ORDER — PREDNISONE 20 MG PO TABS
20.0000 mg | ORAL_TABLET | Freq: Every day | ORAL | Status: DC
  Administered 2019-08-06 – 2019-08-07 (×2): 20 mg via ORAL
  Filled 2019-08-05 (×2): qty 1

## 2019-08-05 MED ORDER — SODIUM CHLORIDE 0.9 % IV SOLN
INTRAVENOUS | Status: DC | PRN
  Administered 2019-08-05: 250 mL via INTRAVENOUS

## 2019-08-05 MED ORDER — SODIUM CHLORIDE 0.9 % IV SOLN
2.0000 g | INTRAVENOUS | Status: DC
  Administered 2019-08-05 – 2019-08-06 (×2): 2 g via INTRAVENOUS
  Filled 2019-08-05 (×5): qty 2

## 2019-08-05 NOTE — Consult Note (Signed)
   [facility] Inpatient Consult   08/05/2019  [name] 24-Apr-1930 [id number]    HTA-CSNP (Chronic Special Needs Program):   Patient is currently active with [facility]) Care Management for chronic disease management services. Patient has been engaged by a [place] (HTA)- CSNPcoordinator (who is aware of this admission) for HF management.  Our community based plan of care has focused on disease management and community resource support.  Patient will receive a post hospital call and will be evaluated for assessments and disease process education if transitioning to home.  Per chart review and MD note 08/04/19, shows as: [name] a 86 y.o.femalewith PMH HTN, A fib on AC, CHF, Anxiety/Depression, Insomnia, GERD and COPD,   who presents to ER for worsening redness/swelling/pain of RLE despite two different outpatient antibiotics (Cellulitis).  Her Primary Care ProviderisCory [name] with [place] [facility] [place], listed as providing transition of care follow-up.  PT evaluation completed and currently recommending home health PT.  Plan: Will update [facility] HTA-CSNP coordinator of disposition and transitional needs.  Of note, [facility] Care Management services does not replace or interfere with any services that are needed or arranged by inpatient case management or social work.   For questions and additional information,please contact:  Alexy Heldt A. Izaih Kataoka, BSN, RN-BC [facility] Liaison Cell: [phone]

## 2019-08-05 NOTE — Evaluation (Signed)
Occupational Therapy Evaluation Patient Details Name: [name] MRN: [id number] DOB: May 14, 1930 Today's Date: 08/05/2019    History of Present Illness 86 yo female admitted to ED on 1/18 with RLE cellulitis, doppler - DVT on 1/6.PMH includes HTN, colon cancer s/p ostomy procedures, A fib on AC, CHF, Anxiety/Depression, DVT, Insomnia, GERD and COPD.   Clinical Impression   Pt admitted with RLE cellulitis. Pt currently with functional limitations due to the deficits listed below (see OT Problem List).  Pt will benefit from skilled OT to increase their safety and independence with ADL and functional mobility for ADL to facilitate discharge to venue listed below.      Follow Up Recommendations  Home health OT;Supervision/Assistance - 24 hour    Equipment Recommendations  None recommended by OT       Precautions / Restrictions Precautions Precautions: Fall      Mobility Bed Mobility Overal bed mobility: Needs Assistance Bed Mobility: Supine to Sit     Supine to sit: Min guard;HOB elevated     General bed mobility comments: min guard for safety, increased time to perform with use of bedrails.  Transfers Overall transfer level: Needs assistance Equipment used: Rolling walker (2 wheeled) Transfers: Sit to/from [facility] Sit to Stand: From elevated surface;Min assist              Balance Overall balance assessment: Needs assistance;History of Falls Sitting-balance support: No upper extremity supported;Feet supported Sitting balance-Leahy Scale: Good     Standing balance support: Bilateral upper extremity supported;During functional activity Standing balance-Leahy Scale: Poor Standing balance comment: reliant on external support                           ADL either performed or assessed with clinical judgement   ADL Overall ADL's : Needs assistance/impaired Eating/Feeding: Set up;Sitting   Grooming: Set up;Sitting   Upper Body  Bathing: Set up;Sitting   Lower Body Bathing: Minimal assistance;Sit to/from stand   Upper Body Dressing : Set up;Sitting   Lower Body Dressing: Minimal assistance;Sit to/from stand;Cueing for safety;Cueing for sequencing   Toilet Transfer: Minimal assistance;Ambulation;RW;Cueing for safety;Cueing for sequencing   Toileting- Clothing Manipulation and Hygiene: Minimal assistance;Sit to/from stand;Cueing for safety;Cueing for compensatory techniques       Functional mobility during ADLs: Minimal assistance;Rolling walker       Vision Patient Visual Report: No change from baseline              Pertinent Vitals/Pain Pain Score: 6  Pain Location: R great toe, RLE Pain Descriptors / Indicators: Sore;Discomfort;Grimacing;Other (Comment) Pain Intervention(s): Repositioned;Limited activity within patient's tolerance;Patient requesting pain meds-RN notified     Hand Dominance Right   Extremity/Trunk Assessment Upper Extremity Assessment Upper Extremity Assessment: Generalized weakness           Communication Communication Communication: No difficulties   Cognition Arousal/Alertness: Awake/alert Behavior During Therapy: WFL for tasks assessed/performed Overall Cognitive Status: Within Functional Limits for tasks assessed                                                Home Living Family/patient expects to be discharged to:: Private residence Living Arrangements: Alone Available Help at Discharge: Family;Available PRN/intermittently Type of Home: House Home Access: Ramped entrance     Home Layout: One level  Bathroom Shower/Tub: Occupational psychologist: Handicapped height     Home Equipment: Environmental consultant - 2 wheels;Walker - 4 wheels;Bedside commode;Grab bars - tub/shower;Shower seat;Cane - single point;Grab bars - toilet          Prior Functioning/Environment Level of Independence: Independent with assistive device(s)         Comments: pt reports using RW for ambulation PTA, states her daughter can help her if needed but states "I like to be as independent as I can, just me and my little dog". Pt does not drive, so her daughter brings her groceries.        OT Problem List: Decreased strength;Impaired balance (sitting and/or standing);Decreased knowledge of use of DME or AE      OT Treatment/Interventions: Self-care/ADL training;DME and/or AE instruction;Patient/family education;Therapeutic activities    OT Goals(Current goals can be found in the care plan section) Acute Rehab OT Goals Patient Stated Goal: go home and be independent OT Goal Formulation: With patient Time For Goal Achievement: 08/12/19 Potential to Achieve Goals: Good  OT Frequency: Min 2X/week   Barriers to D/C:               AM-PAC OT "6 Clicks" Daily Activity     Outcome Measure Help from another person eating meals?: None Help from another person taking care of personal grooming?: A Little Help from another person toileting, which includes using toliet, bedpan, or urinal?: A Little Help from another person bathing (including washing, rinsing, drying)?: A Little Help from another person to put on and taking off regular upper body clothing?: A Little Help from another person to put on and taking off regular lower body clothing?: A Little 6 Click Score: 19   End of Session Equipment Utilized During Treatment: Rolling walker Nurse Communication: Mobility status;Patient requests pain meds  Activity Tolerance: Patient tolerated treatment well Patient left: in chair;with call bell/phone within reach;with chair alarm set  OT Visit Diagnosis: Unsteadiness on feet (R26.81);Repeated falls (R29.6);Muscle weakness (generalized) (M62.81)                Time: [id number] OT Time Calculation (min): 19 min Charges:  OT General Charges $OT Visit: 1 Visit OT Evaluation $OT Eval Moderate Complexity: 1 Mod  Kari Baars, [place] [phone] Office- [phone], Edwena Felty D 08/05/2019, 4:46 PM

## 2019-08-05 NOTE — Evaluation (Signed)
Physical Therapy Evaluation Patient Details Name: [name] MRN: [id number] DOB: [birth year] Today's Date: 08/05/2019   History of Present Illness  86 yo female admitted to ED on 1/18 with RLE cellulitis, doppler - DVT on 1/6.PMH includes HTN, colon cancer s/p ostomy procedures, A fib on AC, CHF, Anxiety/Depression, DVT, Insomnia, GERD and COPD.  Clinical Impression   Pt presents with generalized weakness, increased time and effort to mobilize, decreased activity tolerance, and unsteadiness in standing with history of 2 falls. Pt to benefit from acute PT to address deficits. Pt ambulated room distance with use of RW with min guard assist, required some assist to stand from low seated surface but per pt report she has elevated toilet heights and grab bars to assist with this. PT states she wants to be as independent as possible, but will ask for assist from daughter as needed upon d/c. PT recommending HHPT to address mobility deficits and progress pt activity tolerance. PT to progress mobility as tolerated, and will continue to follow acutely.      Follow Up Recommendations Home health PT;Supervision for mobility/OOB    Equipment Recommendations  None recommended by PT    Recommendations for Other Services       Precautions / Restrictions Precautions Precautions: Fall Restrictions Weight Bearing Restrictions: No      Mobility  Bed Mobility Overal bed mobility: Needs Assistance Bed Mobility: Supine to Sit     Supine to sit: Min guard;HOB elevated     General bed mobility comments: min guard for safety, increased time to perform with use of bedrails.  Transfers Overall transfer level: Needs assistance Equipment used: Rolling walker (2 wheeled) Transfers: Sit to/from Stand Sit to Stand: Min guard;From elevated surface;Min assist         General transfer comment: min guard for safety from elevated bed, min assist for power up from toilet. Verbal cuing for hand placement  when rising from EOB.  Ambulation/Gait Ambulation/Gait assistance: Min guard Gait Distance (Feet): 25 Feet Assistive device: Rolling walker (2 wheeled) Gait Pattern/deviations: Step-through pattern;Decreased stride length;Trunk flexed Gait velocity: decr   General Gait Details: min guard for safety for ambulation to and from bathroom. Verbal cuing for upright posture as pt tends to forward flex trunk and keep eyes towards ground.  Stairs            Wheelchair Mobility    Modified Rankin (Stroke Patients Only)       Balance Overall balance assessment: Needs assistance;History of Falls Sitting-balance support: No upper extremity supported;Feet supported Sitting balance-Leahy Scale: Good     Standing balance support: Bilateral upper extremity supported;During functional activity Standing balance-Leahy Scale: Poor Standing balance comment: reliant on external support                             Pertinent Vitals/Pain Pain Assessment: 0-10 Pain Score: 10-Worst pain ever Pain Location: R great toe, RLE Pain Descriptors / Indicators: Sore;Discomfort;Grimacing;Other (Comment)(great toe feels like gouty pain) Pain Intervention(s): Limited activity within patient's tolerance;Monitored during session;Repositioned    Home Living Family/patient expects to be discharged to:: Private residence Living Arrangements: Alone Available Help at Discharge: Family;Available PRN/intermittently Type of Home: House Home Access: Ramped entrance     Home Layout: One level Home Equipment: Walker - 2 wheels;Walker - 4 wheels;Bedside commode;Grab bars - tub/shower;Shower seat;Cane - single point;Grab bars - toilet      Prior Function Level of Independence: Independent with assistive device(s)  Comments: pt reports using RW for ambulation PTA, states her daughter can help her if needed but states "I like to be as independent as I can, just me and my little dog". Pt does  not drive, so her daughter brings her groceries.     Hand Dominance   Dominant Hand: Right    Extremity/Trunk Assessment   Upper Extremity Assessment Upper Extremity Assessment: Defer to OT evaluation    Lower Extremity Assessment Lower Extremity Assessment: Generalized weakness    Cervical / Trunk Assessment Cervical / Trunk Assessment: Kyphotic  Communication   Communication: No difficulties  Cognition Arousal/Alertness: Awake/alert Behavior During Therapy: WFL for tasks assessed/performed Overall Cognitive Status: Within Functional Limits for tasks assessed                                        General Comments General comments (skin integrity, edema, etc.): RLE swelling and redness noted secondary to cellulitis, pt with sloughing of skin as well    Exercises General Exercises - Lower Extremity Ankle Circles/Pumps: AROM;Both;10 reps;Seated Quad Sets: AROM;Both;5 reps;Seated   Assessment/Plan    PT Assessment Patient needs continued PT services  PT Problem List Decreased strength;Decreased mobility;Decreased activity tolerance;Decreased balance;Decreased knowledge of use of DME;Decreased skin integrity;Pain       PT Treatment Interventions DME instruction;Therapeutic activities;Gait training;Therapeutic exercise;Patient/family education;Balance training;Stair training;Functional mobility training    PT Goals (Current goals can be found in the Care Plan section)  Acute Rehab PT Goals Patient Stated Goal: go home and be independent PT Goal Formulation: With patient Time For Goal Achievement: 08/19/19 Potential to Achieve Goals: Good    Frequency Min 3X/week   Barriers to discharge        Co-evaluation               AM-PAC PT "6 Clicks" Mobility  Outcome Measure Help needed turning from your back to your side while in a flat bed without using bedrails?: A Little Help needed moving from lying on your back to sitting on the side of a  flat bed without using bedrails?: A Little Help needed moving to and from a bed to a chair (including a wheelchair)?: A Little Help needed standing up from a chair using your arms (e.g., wheelchair or bedside chair)?: A Little Help needed to walk in hospital room?: A Little Help needed climbing 3-5 steps with a railing? : A Lot 6 Click Score: 17    End of Session Equipment Utilized During Treatment: Gait belt Activity Tolerance: Patient tolerated treatment well;Patient limited by pain Patient left: in chair;with chair alarm set;with call bell/phone within reach Nurse Communication: Mobility status PT Visit Diagnosis: Other abnormalities of gait and mobility (R26.89);History of falling (Z91.81);Muscle weakness (generalized) (M62.81)    Time: [id number] PT Time Calculation (min) (ACUTE ONLY): 21 min   Charges:   PT Evaluation $PT Eval Low Complexity: 1 Low         Legacy Lacivita E, PT Acute Rehabilitation Services Pager [phone]  Office [phone]   Dez Stauffer D Ileah Falkenstein 08/05/2019, 11:40 AM

## 2019-08-05 NOTE — Progress Notes (Signed)
PROGRESS NOTE  [name] [id number] DOB: May 20, 1930 DOA: 08/02/2019 PCP: Whitney Peng, NP  Brief History   [name] is a 86 y.o. female with PMH HTN, A fib on AC, CHF, Anxiety/Depression, Insomnia, GERD and COPD who presents to ER for worsening redness/swelling/pain of RLE despite two different outpatient antibiotics. Patient reports that a few weeks ago (12/26) she was opening a drawer at home and it hit her RLE and there was just a bruise; denies open wound. Shortly thereafter she began to notice redness and swelling which has continued to worsen despite trying Doxycycline and then Keflex which were prescribed by PCP. Patient reports worsening pain as well and clear drainage. Initially the redness was more localized to below her shin and now is extending towards her knee. Due to the pain, she has not been eating much over the past week. Denies fever or chills. Denies cough, SOB, chest pain, abdominal pain, nausea, vomiting, diarrhea, constipation, dysuria, hematuria, hematochezia, melena, difficulty moving arms/legs, speech difficulty, trouble eating, confusion or any other complaints. Reports compliance with home medications which were reviewed at bedside with patient.  In the ED: Vitals stable with exception of elevated blood pressure. Labs remarkable for: Lactate WNL, WBC 7.2, glucose 180, Cr 1.46, BNP 345. UA collected. Patient started on Vancomycin and given IV Fentanyl. ER provider called for admission.  The patient has been admitted to a telemetry bed. Urine culture has grown out pseudomonas aeruginosa. Her Rochephin has been changed to cefepime for better coverage pending sensitivities.  Consultants  . None  Procedures  . None  Antibiotics   Anti-infectives (From admission, onward)   Start     Dose/Rate Route Frequency Ordered Stop   08/05/19 1800  ceFEPIme (MAXIPIME) 2 g in sodium chloride 0.9 % 100 mL IVPB     2 g 200 mL/hr over 30 Minutes Intravenous Every 24 hours  08/05/19 1413     08/04/19 1800  ceFEPIme (MAXIPIME) 1 g in sodium chloride 0.9 % 100 mL IVPB  Status:  Discontinued     1 g 200 mL/hr over 30 Minutes Intravenous Every 12 hours 08/04/19 1634 08/05/19 1413   08/04/19 0900  cefTRIAXone (ROCEPHIN) 1 g in sodium chloride 0.9 % 100 mL IVPB  Status:  Discontinued     1 g 200 mL/hr over 30 Minutes Intravenous Every 24 hours 08/04/19 0856 08/04/19 1634   08/03/19 1400  vancomycin (VANCOCIN) IVPB 1000 mg/200 mL premix  Status:  Discontinued     1,000 mg 200 mL/hr over 60 Minutes Intravenous Every 48 hours 08/03/19 0144 08/05/19 1449   08/03/19 0000  vancomycin (VANCOCIN) IVPB 1000 mg/200 mL premix     1,000 mg 200 mL/hr over 60 Minutes Intravenous  Once 08/02/19 2349 08/03/19 0145     Subjective  The patient is resting. She is complaining of pain in the first metatarsal phalanges of her right foot.  Objective   Vitals:  Vitals:   08/05/19 0700 08/05/19 1352  BP:  111/66  Pulse:  80  Resp: 16 20  Temp:  98.5 F (36.9 C)  SpO2:  98%   Exam:  Constitutional:  . The patient is awake, alert, and oriented x 3. No acute distress. Respiratory:  . No increased work of breathing. . No wheezes, rales, or rhonchi . No tactile fremitus Cardiovascular:  . Regular rate and rhythm . No murmurs, ectopy, or gallups. . No lateral PMI. No thrills. Abdomen:  . Abdomen is soft, non-tender, non-distended . No hernias,  masses, or organomegaly . Normoactive bowel sounds.  Musculoskeletal:  . No cyanosis, clubbing, or edema . There is erythema, warmth and tenderness of the first MTP of the right foot. Skin:  . No rashes, lesions, ulcers . palpation of skin: no induration or nodules . Right lower extremity is warm and swollen, but much less tender to touch.   Neurologic:  . CN 2-12 intact . Sensation all 4 extremities intact Psychiatric:  . Mental status o Mood, affect appropriate o Orientation to person, place, time  . judgment and insight  appear intact  I have personally reviewed the following:   Today's Data  . Vitals, creatinine  Micro Data  . COVID-19 negative . Urine culture has grown out pseudomonas aeruginosa  Scheduled Meds: . apixaban  2.5 mg Oral BID  . benazepril  40 mg Oral Daily  . budesonide  0.25 mg Nebulization BID  . furosemide  40 mg Oral Daily  . gabapentin  600 mg Oral QHS  . metoprolol tartrate  75 mg Oral BID  . ondansetron  4 mg Oral Once  . pantoprazole  40 mg Oral Daily  . sertraline  50 mg Oral Daily  . traZODone  50 mg Oral QHS  . umeclidinium bromide  1 puff Inhalation Daily   Continuous Infusions: . ceFEPime (MAXIPIME) IV      Principal Problem:   Cellulitis Active Problems:   Depression   Essential hypertension   Coronary atherosclerosis   DIASTOLIC HEART FAILURE, CHRONIC   GERD   Neuropathic pain   Insomnia   Chronic atrial fibrillation (HCC)   Chronic anticoagulation   Centrilobular emphysema (HCC)   CKD (chronic kidney disease), stage III   LOS: 2 days   A & P  1. Cellulitis: Worsening cellulitis despite outpatient therapy trial of Doxycycline and then Keflex. Doppler on 07/21/2019 negative for DVT. The patient is receiving IV Vancomycin. I will add cefepime to this for better coverage of gram negatives. Blood cultures x 2 have been drawn and have had no growth. She is receiving Norco for pain.  Gout: 1st MTP joint of the right foot. Will start on low dose steroids.  UTI: Although urinalysis was not impressive for UTI, her urine culture has grown out pseudomonas aeruginosa. I have changed Rocephin to cefepime for this.  CHF: Last echocardiogram was 10/2017. EF was 55-60%. No evaluation of diastolic dysfunction was possible due to atrial fibrillation. The left atrium was severely dilated. There was also evidence of moderate pulmonary hypertension. Will check another echocardiogram. She takes lasix, metoprolol, and benazapril at home. These have all been  continued.  Atrial Fibrillation: EKG on 07/21/2019 demonstrated atrial fibrillation with a controlled rate. She takes elquis at home for anticoagulation, and her rate is controlled with diltiazem and metoprolol. Diltiazem has been held due to low blood pressures, but metoprolol and Eliquis have been continued.  Essential HTN: Blood pressures are under fair control on Metoprolol, lasix, benazapril. Monitor.  Anxiety/Depression: Continue PTA Zoloft, Trazodone, and PRN Ativan.  Neuropathic pain: Continue PTA Gabapentin although it has been reduced by half to qhs on admission considering baseline renal function.  COPD: Continue PTA inhalers  CKD Stage 3: Creatinine is at baseline. Avoid nephrotoxic agents, renally dose medications, and avoid hypotension. Monitor creatinine, electrolytes, and volume status.  I have seen and examined this patient myself. I have spent 30 minutes in her evaluation and care.  Code Status: Full Code; d/w patient DVT Prophylaxis: Chronic AC; Eliquis  Family Communication: The patient  has asked me to call her daughter, [name].  Disposition Plan: Admit to Med-Surg. Patient requires IV abx and is high risk for decompensation considering age and co-morbidities. Anticipate discharge within next 3-4 days. [place] Dube, DO [facility] Direct contact: see [email or web address]  7PM-7AM contact night coverage as above 08/05/2019, 4:46 PM  LOS: 0 days

## 2019-08-06 LAB — BASIC METABOLIC PANEL
Anion gap: 9 (ref 5–15)
BUN: 31 mg/dL — ABNORMAL HIGH (ref 8–23)
CO2: 27 mmol/L (ref 22–32)
Calcium: 8.8 mg/dL — ABNORMAL LOW (ref 8.9–10.3)
Chloride: 104 mmol/L (ref 98–111)
Creatinine, Ser: 1.23 mg/dL — ABNORMAL HIGH (ref 0.44–1.00)
GFR calc Af Amer: 46 mL/min — ABNORMAL LOW (ref >60)
GFR calc non Af Amer: 39 mL/min — ABNORMAL LOW (ref >60)
Glucose, Bld: 99 mg/dL (ref 70–99)
Potassium: 4.2 mmol/L (ref 3.5–5.1)
Sodium: 140 mmol/L (ref 135–145)

## 2019-08-06 LAB — CBC WITH DIFFERENTIAL/PLATELET
Abs Immature Granulocytes: 0.03 10*3/uL (ref 0.00–0.07)
Basophils Absolute: 0 10*3/uL (ref 0.0–0.1)
Basophils Relative: 0 %
Eosinophils Absolute: 0.3 10*3/uL (ref 0.0–0.5)
Eosinophils Relative: 4 %
HCT: 35.2 % — ABNORMAL LOW (ref 36.0–46.0)
Hemoglobin: 11 g/dL — ABNORMAL LOW (ref 12.0–15.0)
Immature Granulocytes: 0 %
Lymphocytes Relative: 16 %
Lymphs Abs: 1.1 10*3/uL (ref 0.7–4.0)
MCH: 30.4 pg (ref 26.0–34.0)
MCHC: 31.3 g/dL (ref 30.0–36.0)
MCV: 97.2 fL (ref 80.0–100.0)
Monocytes Absolute: 0.8 10*3/uL (ref 0.1–1.0)
Monocytes Relative: 12 %
Neutro Abs: 4.6 10*3/uL (ref 1.7–7.7)
Neutrophils Relative %: 68 %
Platelets: 181 10*3/uL (ref 150–400)
RBC: 3.62 MIL/uL — ABNORMAL LOW (ref 3.87–5.11)
RDW: 14.2 % (ref 11.5–15.5)
WBC: 6.8 10*3/uL (ref 4.0–10.5)
nRBC: 0 % (ref 0.0–0.2)

## 2019-08-06 MED ORDER — APIXABAN 5 MG PO TABS
5.0000 mg | ORAL_TABLET | Freq: Two times a day (BID) | ORAL | Status: DC
  Administered 2019-08-06 – 2019-08-07 (×2): 5 mg via ORAL
  Filled 2019-08-06 (×2): qty 1

## 2019-08-06 NOTE — Progress Notes (Signed)
Occupational Therapy Treatment Patient Details Name: [name] MRN: [id number] DOB: September 06, 1929 Today's Date: 08/06/2019    History of present illness 86 yo female admitted to ED on 1/18 with RLE cellulitis, doppler - DVT on 1/6.PMH includes HTN, colon cancer s/p ostomy procedures, A fib on AC, CHF, Anxiety/Depression, DVT, Insomnia, GERD and COPD.   OT comments  Daughter present.   Follow Up Recommendations  Home health OT;Supervision/Assistance - 24 hour    Equipment Recommendations  None recommended by OT    Recommendations for Other Services      Precautions / Restrictions Precautions Precautions: Fall Restrictions Weight Bearing Restrictions: No Other Position/Activity Restrictions: WBAT       Mobility Bed Mobility Overal bed mobility: Independent Bed Mobility: Supine to Sit     Supine to sit: Supervision     General bed mobility comments: .  Transfers Overall transfer level: Needs assistance Equipment used: Rolling walker (2 wheeled) Transfers: Sit to/from [facility] Sit to Stand: From elevated surface;Min assist Stand pivot transfers: Min assist       General transfer comment: .    Balance Overall balance assessment: Needs assistance;History of Falls Sitting-balance support: No upper extremity supported;Feet supported Sitting balance-Leahy Scale: Good     Standing balance support: Bilateral upper extremity supported;During functional activity Standing balance-Leahy Scale: Poor Standing balance comment: reliant on external support                           ADL either performed or assessed with clinical judgement   ADL Overall ADL's : Needs assistance/impaired Eating/Feeding: Set up;Sitting   Grooming: Set up;Sitting   Upper Body Bathing: Set up;Sitting   Lower Body Bathing: Minimal assistance;Sit to/from stand   Upper Body Dressing : Set up;Sitting   Lower Body Dressing: Minimal assistance;Sit to/from  stand;Cueing for safety;Cueing for sequencing   Toilet Transfer: Minimal assistance;Ambulation;RW;Cueing for safety;Cueing for sequencing   Toileting- Clothing Manipulation and Hygiene: Minimal assistance;Sit to/from stand;Cueing for safety;Cueing for compensatory techniques       Functional mobility during ADLs: Minimal assistance;Rolling walker General ADL Comments: daughter present.  Encouraged pt to have 24/7 A at home initially.  Pt overall min A with ADL activity     Vision Patient Visual Report: No change from baseline            Cognition Arousal/Alertness: Awake/alert Behavior During Therapy: WFL for tasks assessed/performed Overall Cognitive Status: Within Functional Limits for tasks assessed                                                     Pertinent Vitals/ Pain       Pain Assessment: Faces Pain Score: 2  Faces Pain Scale: Hurts a little bit Pain Location: R great toe, RLE Pain Descriptors / Indicators: Sore;Discomfort;Grimacing Pain Intervention(s): Limited activity within patient's tolerance;Monitored during session         Frequency  Min 2X/week        Progress Toward Goals  OT Goals(current goals can now be found in the care plan section)  Progress towards OT goals: Progressing toward goals  Acute Rehab OT Goals Patient Stated Goal: go home and be independent  Plan         AM-PAC OT "6 Clicks" Daily Activity     Outcome Measure  Help from another person eating meals?: None Help from another person taking care of personal grooming?: A Little Help from another person toileting, which includes using toliet, bedpan, or urinal?: A Little Help from another person bathing (including washing, rinsing, drying)?: A Little Help from another person to put on and taking off regular upper body clothing?: A Little Help from another person to put on and taking off regular lower body clothing?: A Little 6 Click Score: 19    End  of Session Equipment Utilized During Treatment: Rolling walker  OT Visit Diagnosis: Unsteadiness on feet (R26.81);Repeated falls (R29.6);Muscle weakness (generalized) (M62.81)   Activity Tolerance Patient tolerated treatment well   Patient Left in chair;with call bell/phone within reach;with chair alarm set   Nurse Communication Mobility status;Patient requests pain meds        Time: 0112-0135 OT Time Calculation (min): 23 min  Charges: OT General Charges $OT Visit: 1 Visit OT Treatments $Self Care/Home Management : 8-22 mins  Kari Baars, [place] [phone] Office- [phone], Edwena Felty D 08/06/2019, 2:19 PM

## 2019-08-06 NOTE — Progress Notes (Signed)
PROGRESS NOTE  [name] [id number] DOB: 04-08-1930 DOA: 08/02/2019 PCP: [name], NP  Brief History   [name] is a 86 y.o. female with PMH HTN, A fib on AC, CHF, Anxiety/Depression, Insomnia, GERD and COPD who presents to ER for worsening redness/swelling/pain of RLE despite two different outpatient antibiotics. Patient reports that a few weeks ago (12/26) she was opening a drawer at home and it hit her RLE and there was just a bruise; denies open wound. Shortly thereafter she began to notice redness and swelling which has continued to worsen despite trying Doxycycline and then Keflex which were prescribed by PCP. Patient reports worsening pain as well and clear drainage. Initially the redness was more localized to below her shin and now is extending towards her knee. Due to the pain, she has not been eating much over the past week. Denies fever or chills. Denies cough, SOB, chest pain, abdominal pain, nausea, vomiting, diarrhea, constipation, dysuria, hematuria, hematochezia, melena, difficulty moving arms/legs, speech difficulty, trouble eating, confusion or any other complaints. Reports compliance with home medications which were reviewed at bedside with patient.  In the ED: Vitals stable with exception of elevated blood pressure. Labs remarkable for: Lactate WNL, WBC 7.2, glucose 180, Cr 1.46, BNP 345. UA collected. Patient started on Vancomycin and given IV Fentanyl. ER provider called for admission.  The patient has been admitted to a telemetry bed. Urine culture has grown out pseudomonas aeruginosa. Her Rochephin has been changed to cefepime for better coverage pending sensitivities.  Consultants  . None  Procedures  . None  Antibiotics   Anti-infectives (From admission, onward)   Start     Dose/Rate Route Frequency Ordered Stop   08/05/19 1800  ceFEPIme (MAXIPIME) 2 g in sodium chloride 0.9 % 100 mL IVPB     2 g 200 mL/hr over 30 Minutes Intravenous Every 24 hours  08/05/19 1413     08/04/19 1800  ceFEPIme (MAXIPIME) 1 g in sodium chloride 0.9 % 100 mL IVPB  Status:  Discontinued     1 g 200 mL/hr over 30 Minutes Intravenous Every 12 hours 08/04/19 1634 08/05/19 1413   08/04/19 0900  cefTRIAXone (ROCEPHIN) 1 g in sodium chloride 0.9 % 100 mL IVPB  Status:  Discontinued     1 g 200 mL/hr over 30 Minutes Intravenous Every 24 hours 08/04/19 0856 08/04/19 1634   08/03/19 1400  vancomycin (VANCOCIN) IVPB 1000 mg/200 mL premix  Status:  Discontinued     1,000 mg 200 mL/hr over 60 Minutes Intravenous Every 48 hours 08/03/19 0144 08/05/19 1449   08/03/19 0000  vancomycin (VANCOCIN) IVPB 1000 mg/200 mL premix     1,000 mg 200 mL/hr over 60 Minutes Intravenous  Once 08/02/19 2349 08/03/19 0145     Subjective  The patient is resting. Less pain in great toe of right foot.  Objective   Vitals:  Vitals:   08/06/19 0715 08/06/19 1235  BP:  111/76  Pulse: 81 83  Resp: 17 20  Temp:  98.3 F (36.8 C)  SpO2: 95% 97%   Exam:  Constitutional:  . The patient is awake, alert, and oriented x 3. No acute distress. Respiratory:  . No increased work of breathing. . No wheezes, rales, or rhonchi . No tactile fremitus Cardiovascular:  . Regular rate and rhythm . No murmurs, ectopy, or gallups. . No lateral PMI. No thrills. Abdomen:  . Abdomen is soft, non-tender, non-distended . No hernias, masses, or organomegaly . Normoactive bowel  sounds.  Musculoskeletal:  . No cyanosis, clubbing, or edema . There is erythema, warmth and tenderness of the first MTP of the right foot. Skin:  . No rashes, lesions, ulcers . palpation of skin: no induration or nodules . Right lower extremity is remains somewhat warm and swollen with little improvement over yesterday.   Neurologic:  . CN 2-12 intact . Sensation all 4 extremities intact Psychiatric:  . Mental status o Mood, affect appropriate o Orientation to person, place, time  . judgment and insight appear  intact  I have personally reviewed the following:   Today's Data  . Vitals, creatinine  Micro Data  . COVID-19 negative . Urine culture has grown out pseudomonas aeruginosa  Scheduled Meds: . apixaban  5 mg Oral BID  . benazepril  40 mg Oral Daily  . budesonide  0.25 mg Nebulization BID  . furosemide  40 mg Oral Daily  . gabapentin  600 mg Oral QHS  . metoprolol tartrate  75 mg Oral BID  . ondansetron  4 mg Oral Once  . pantoprazole  40 mg Oral Daily  . predniSONE  20 mg Oral Q breakfast  . sertraline  50 mg Oral Daily  . traZODone  50 mg Oral QHS  . umeclidinium bromide  1 puff Inhalation Daily   Continuous Infusions: . sodium chloride Stopped (08/05/19 2224)  . ceFEPime (MAXIPIME) IV Stopped (08/05/19 2123)    Principal Problem:   Cellulitis Active Problems:   Depression   Essential hypertension   Coronary atherosclerosis   DIASTOLIC HEART FAILURE, CHRONIC   GERD   Neuropathic pain   Insomnia   Chronic atrial fibrillation (HCC)   Chronic anticoagulation   Centrilobular emphysema (HCC)   CKD (chronic kidney disease), stage III   LOS: 3 days   A & P  Cellulitis: Worsening cellulitis despite outpatient therapy trial of Doxycycline and then Keflex. Doppler on 07/21/2019 negative for DVT. The patient is receiving IV Vancomycin. I will add cefepime to this for better coverage of gram negatives. Blood cultures x 2 have been drawn and have had no growth. She is receiving Norco for pain. Will continue IV antibiotics for another day. Not much improvement since yesterday.  Gout: 1st MTP joint of the right foot. Will start on low dose steroids.  UTI: Although urinalysis was not impressive for UTI, her urine culture has grown out pseudomonas aeruginosa. I have changed Rocephin to cefepime for this.  CHF: Last echocardiogram was 10/2017. EF was 55-60%. No evaluation of diastolic dysfunction was possible due to atrial fibrillation. The left atrium was severely dilated. There  was also evidence of moderate pulmonary hypertension. Will check another echocardiogram. She takes lasix, metoprolol, and benazapril at home. These have all been continued.  Atrial Fibrillation: EKG on 07/21/2019 demonstrated atrial fibrillation with a controlled rate. She takes elquis at home for anticoagulation, and her rate is controlled with diltiazem and metoprolol. Diltiazem has been held due to low blood pressures, but metoprolol and Eliquis have been continued.  Essential HTN: Blood pressures are under fair control on Metoprolol, lasix, benazapril. Monitor.  Anxiety/Depression: Continue PTA Zoloft, Trazodone, and PRN Ativan.  Neuropathic pain: Continue PTA Gabapentin although it has been reduced by half to qhs on admission considering baseline renal function.  COPD: Continue PTA inhalers  CKD Stage 3: Creatinine is at baseline. Avoid nephrotoxic agents, renally dose medications, and avoid hypotension. Monitor creatinine, electrolytes, and volume status.  I have seen and examined this patient myself. I have spent 34  minutes in her evaluation and care.  Code Status: Full Code; d/w patient DVT Prophylaxis: Chronic AC; Eliquis  Family Communication: The patient has asked me to call her daughter, [name].  Disposition Plan: Admit to Med-Surg. Patient requires IV abx and is high risk for decompensation considering age and co-morbidities. Anticipate discharge within next 3-4 days. Whitney Helf, DO [facility] Direct contact: see [email or web address]  7PM-7AM contact night coverage as above 08/06/2019, 4:19 PM  LOS: 0 days

## 2019-08-06 NOTE — Progress Notes (Signed)
Physical Therapy Treatment Patient Details Name: [name] MRN: [id number] DOB: June 24, 1930 Today's Date: 08/06/2019    History of Present Illness 86 yo female admitted to ED on 1/18 with RLE cellulitis, doppler - DVT on 1/6.PMH includes HTN, colon cancer s/p ostomy procedures, A fib on AC, CHF, Anxiety/Depression, DVT, Insomnia, GERD and COPD.    PT Comments    Pt in bed AxO x 4.  Highly motivated.  Lives home alone.  Pt stated, "Now I have got the Gout".  Assisted OOB to amb to bathroom then in hallway.  Pt was able to amb a functional distance.  General Gait Details: min guard for safety for ambulation to and from bathroom. Verbal cuing for upright posture as pt tends to forward flex trunk and keep eyes towards ground.General transfer comment: min guard for safety from elevated bed, min assist for power up from toilet. Verbal cuing for hand placement when rising from EOB and safety with turns.  Positioned in recliner with R LE elevated.  "Still burns" stated pt  Follow Up Recommendations  Home health PT;Supervision for mobility/OOB     Equipment Recommendations  None recommended by PT    Recommendations for Other Services       Precautions / Restrictions Precautions Precautions: Fall Restrictions Weight Bearing Restrictions: No Other Position/Activity Restrictions: WBAT    Mobility  Bed Mobility Overal bed mobility: Needs Assistance Bed Mobility: Supine to Sit     Supine to sit: Supervision     General bed mobility comments: pt self able  Transfers Overall transfer level: Needs assistance Equipment used: Rolling walker (2 wheeled) Transfers: Sit to/from [employer] Transfers Sit to Stand: From elevated surface;Min guard;Supervision Stand pivot transfers: Supervision;Min guard       General transfer comment: min guard for safety from elevated bed, min assist for power up from toilet. Verbal cuing for hand placement when rising from  EOB.  Ambulation/Gait Ambulation/Gait assistance: Supervision;Min guard Gait Distance (Feet): 55 Feet Assistive device: Rolling walker (2 wheeled) Gait Pattern/deviations: Step-through pattern;Decreased stride length;Trunk flexed Gait velocity: decr   General Gait Details: min guard for safety for ambulation to and from bathroom. Verbal cuing for upright posture as pt tends to forward flex trunk and keep eyes towards ground.   Stairs             Wheelchair Mobility    Modified Rankin (Stroke Patients Only)       Balance                                            Cognition Arousal/Alertness: Awake/alert Behavior During Therapy: WFL for tasks assessed/performed Overall Cognitive Status: Within Functional Limits for tasks assessed                                        Exercises      General Comments        Pertinent Vitals/Pain Pain Assessment: Faces Faces Pain Scale: Hurts a little bit Pain Location: R great toe, RLE Pain Descriptors / Indicators: Sore;Discomfort;Grimacing;Other (Comment) Pain Intervention(s): Monitored during session;Premedicated before session;Repositioned    Home Living                      Prior Function  PT Goals (current goals can now be found in the care plan section) Progress towards PT goals: Progressing toward goals    Frequency    Min 3X/week      PT Plan Current plan remains appropriate    Co-evaluation              AM-PAC PT "6 Clicks" Mobility   Outcome Measure  Help needed turning from your back to your side while in a flat bed without using bedrails?: None Help needed moving from lying on your back to sitting on the side of a flat bed without using bedrails?: None Help needed moving to and from a bed to a chair (including a wheelchair)?: A Little Help needed standing up from a chair using your arms (e.g., wheelchair or bedside chair)?: A  Little Help needed to walk in hospital room?: A Little Help needed climbing 3-5 steps with a railing? : A Little 6 Click Score: 20    End of Session Equipment Utilized During Treatment: Gait belt Activity Tolerance: Patient tolerated treatment well;Patient limited by pain Patient left: in chair;with chair alarm set;with call bell/phone within reach Nurse Communication: Mobility status PT Visit Diagnosis: Other abnormalities of gait and mobility (R26.89);History of falling (Z91.81);Muscle weakness (generalized) (M62.81)     Time: [id number] PT Time Calculation (min) (ACUTE ONLY): 17 min  Charges:  $Gait Training: 8-22 mins                     Rica Koyanagi  PTA Acute  Rehabilitation Services Pager      [phone] Office      [phone]

## 2019-08-07 MED ORDER — PREDNISONE 20 MG PO TABS: 20.0000 mg | ORAL_TABLET | Freq: Every day | ORAL | 0 refills | Status: DC

## 2019-08-07 MED ORDER — CEFDINIR 300 MG PO CAPS: 300.0000 mg | ORAL_CAPSULE | Freq: Two times a day (BID) | ORAL | 0 refills | Status: AC

## 2019-08-07 MED ORDER — HYDROCODONE-ACETAMINOPHEN 5-325 MG PO TABS: 1.0000 | ORAL_TABLET | Freq: Four times a day (QID) | ORAL | 0 refills | Status: DC | PRN

## 2019-08-07 NOTE — Progress Notes (Signed)
Pt discharged home with daughter [name] in stable condition. Discharge instructions and script given. Other scripts sent to pharmacy of choice. Pt and daughter verbalized understanding. No immediate questions or concerns. Discharged from unit via wheelchair.

## 2019-08-07 NOTE — Discharge Summary (Signed)
Physician Discharge Summary  [name] [id number] DOB: Aug 23, 1929 DOA: 08/02/2019  PCP: [name], NP  Admit date: 08/02/2019 Discharge date: 08/07/2019  Recommendations for Outpatient Follow-up:  1. Keep right leg elevated as much as possible. 2. Follow up with PCP in 7-10 days.  Discharge Diagnoses: Principal diagnosis is #1 1. Cellulitis right lower extremity 2. Gout 1st digit right foot. 3. UTI 4. Chronic systolic CHF 5. Atrial fibrillation 6. Essential hypertension 7. Anxiety/depression 8. Neuropathic pain 9. COPD 10. CKD 3  Discharge Condition: Fair  Disposition: Home  Diet recommendation: Heart healthy  Filed Weights   08/02/19 1653  Weight: 87.5 kg    History of present illness:  [name] is a 86 y.o. female with PMH HTN, A fib on AC, CHF, Anxiety/Depression, Insomnia, GERD and COPD who presents to ER for worsening redness/swelling/pain of RLE despite two different outpatient antibiotics. Patient reports that a few weeks ago (12/26) she was opening a drawer at home and it hit her RLE and there was just a bruise; denies open wound. Shortly thereafter she began to notice redness and swelling which has continued to worsen despite trying Doxycycline and then Keflex which were prescribed by PCP. Patient reports worsening pain as well and clear drainage. Initially the redness was more localized to below her shin and now is extending towards her knee. Due to the pain, she has not been eating much over the past week. Denies fever or chills. Denies cough, SOB, chest pain, abdominal pain, nausea, vomiting, diarrhea, constipation, dysuria, hematuria, hematochezia, melena, difficulty moving arms/legs, speech difficulty, trouble eating, confusion or any other complaints. Reports compliance with home medications which were reviewed at bedside with patient.  In the ED: Vitals stable with exception of elevated blood pressure. Labs remarkable for: Lactate WNL, WBC 7.2,  glucose 180, Cr 1.46, BNP 345. UA collected. Patient started on Vancomycin and given IV Fentanyl. ER provider called for admission.  Hospital Course:  The patient has been admitted to a telemetry bed. Urine culture has grown out pseudomonas aeruginosa. Her Rochephin has been changed to cefepime for better coverage pending sensitivities. She developed a gouty flare of her right great toe and was given a burst of prednisone for this. The erythema, warmth, and swelling of the right lower extremity resolved as did the tenderness, erythema and warmth of the right great toe. She will be discharged to home in good condition.  Today's assessment: S: The patient is resting comfortably. No new complaints. O: Vitals:  Vitals:   08/07/19 0548 08/07/19 0813  BP: 116/67   Pulse: 72   Resp: 16   Temp: 97.8 F (36.6 C)   SpO2: 96% 96%   Constitutional:   The patient is awake, alert, and oriented x 3. No acute distress. Respiratory:   No increased work of breathing.  No wheezes, rales, or rhonchi  No tactile fremitus Cardiovascular:   Regular rate and rhythm  No murmurs, ectopy, or gallups.  No lateral PMI. No thrills. Abdomen:   Abdomen is soft, non-tender, non-distended  No hernias, masses, or organomegaly  Normoactive bowel sounds.  Musculoskeletal:   No cyanosis, clubbing, or edema  Markedly decreased erythema, warmth and tenderness of the right lower extremity and the first MTP of the right foot. Skin:   No rashes, lesions, ulcers  palpation of skin: no induration or nodules Right lower extremity is markedly less warm, red and swollen Neurologic:   CN 2-12 intact  Sensation all 4 extremities intact Psychiatric:  Mental status ? Mood, affect appropriate ? Orientation to person, place, time   judgment and insight appear intact Discharge Instructions  Discharge Instructions    Activity as tolerated - No restrictions   Complete by: As directed    Call MD for:   redness, tenderness, or signs of infection (pain, swelling, redness, odor or green/yellow discharge around incision site)   Complete by: As directed    Call MD for:  temperature >100.4   Complete by: As directed    Diet - low sodium heart healthy   Complete by: As directed    Discharge instructions   Complete by: As directed    Keep right leg elevated as much as possible. Follow up with PCP in 7-10 days.   Increase activity slowly   Complete by: As directed      Allergies as of 08/07/2019   No Known Allergies     Medication List    STOP taking these medications   cephALEXin 500 MG capsule Commonly known as: KEFLEX   colchicine 0.6 MG tablet   diltiazem 120 MG 24 hr capsule Commonly known as: CARDIZEM CD   triamcinolone cream 0.1 % Commonly known as: KENALOG     TAKE these medications   acetaminophen 325 MG tablet Commonly known as: TYLENOL Take 650 mg by mouth every 6 (six) hours as needed for moderate pain.   albuterol 108 (90 Base) MCG/ACT inhaler Commonly known as: ProAir HFA Inhale 2 puffs into the lungs every 6 (six) hours as needed for wheezing or shortness of breath.   allopurinol 100 MG tablet Commonly known as: ZYLOPRIM Take 100 mg by mouth daily.   apixaban 5 MG Tabs tablet Commonly known as: Eliquis Take 1 tablet (5 mg total) by mouth 2 (two) times daily.   benazepril 40 MG tablet Commonly known as: LOTENSIN Take 1 tablet (40 mg total) by mouth daily.   cefdinir 300 MG capsule Commonly known as: OMNICEF Take 1 capsule (300 mg total) by mouth 2 (two) times daily for 5 days.   E-Z Spacer inhaler Use as instructed   esomeprazole 40 MG capsule Commonly known as: NEXIUM Take 1 capsule (40 mg total) by mouth daily.   ferrous sulfate 325 (65 FE) MG EC tablet Take 325 mg by mouth daily with breakfast.   fluticasone 44 MCG/ACT inhaler Commonly known as: Flovent HFA Inhale 2 puffs into the lungs 2 (two) times daily.   furosemide 20 MG  tablet Commonly known as: LASIX Take 2 tablets (40 mg total) by mouth daily. Please keep upcoming appt in November with Dr. Acie Fredrickson before anymore refills. Thank you   gabapentin 600 MG tablet Commonly known as: NEURONTIN Take 1 tablet (600 mg total) by mouth 2 (two) times daily.   HYDROcodone-acetaminophen 5-325 MG tablet Commonly known as: NORCO/VICODIN Take 1-2 tablets by mouth every 6 (six) hours as needed for severe pain. What changed: how much to take   hydrocortisone 2.5 % rectal cream Commonly known as: ANUSOL-HC Place 1 application rectally 2 (two) times daily.   LORazepam 0.5 MG tablet Commonly known as: ATIVAN 1 tablet twice daily as needed for anxiety or sleep What changed:   how much to take  how to take this  when to take this  reasons to take this  additional instructions   metoprolol tartrate 50 MG tablet Commonly known as: LOPRESSOR Take 1.5 tablets (75 mg total) by mouth 2 (two) times daily.   nitroGLYCERIN 0.4 MG SL tablet Commonly known as:  NITROSTAT Place 1 tablet (0.4 mg total) under the tongue every 5 (five) minutes as needed for chest pain (x 3 pills). Reported on 09/01/2015   predniSONE 20 MG tablet Commonly known as: DELTASONE Take 1 tablet (20 mg total) by mouth daily with breakfast. Start taking on: August 08, 2019   sertraline 50 MG tablet Commonly known as: ZOLOFT Take 1 tablet (50 mg total) by mouth daily.   Spiriva Respimat 2.5 MCG/ACT Aers Generic drug: Tiotropium Bromide Monohydrate Inhale 2 puffs into the lungs daily.   traZODone 100 MG tablet Commonly known as: DESYREL TAKE 1/2 (ONE-HALF) TABLET BY MOUTH AT BEDTIME What changed:   how much to take  how to take this  when to take this  additional instructions   vitamin C 1000 MG tablet Take 1,000 mg by mouth daily.      No Known Allergies  The results of significant diagnostics from this hospitalization (including imaging, microbiology, ancillary and  laboratory) are listed below for reference.    Significant Diagnostic Studies: DG Chest 2 View  Result Date: 07/21/2019 CLINICAL DATA:  Chest pain EXAM: CHEST - 2 VIEW COMPARISON:  07/10/2019 FINDINGS: Cardiac enlargement without heart failure. Lungs are clear without infiltrate or effusion. No change from the prior study ACDF cervical spine. Widening of the [facility] joint bilaterally likely postoperative. IMPRESSION: No active cardiopulmonary disease. Electronically Signed   By: Franchot Gallo M.D.   On: 07/21/2019 15:52   DG Chest 2 View  Result Date: 07/10/2019 CLINICAL DATA:  Chest pain. EXAM: CHEST - 2 VIEW COMPARISON:  03/26/2018 FINDINGS: Borderline cardiomegaly. Pulmonary vascularity is normal. The lungs are clear except for minimal scarring at the left lung base. No effusions. No acute bone abnormality. Large chronic hiatal hernia. IMPRESSION: No active cardiopulmonary disease. Electronically Signed   By: Lorriane Shire M.D.   On: 07/10/2019 12:47   DG Tibia/Fibula Right  Result Date: 07/21/2019 CLINICAL DATA:  Injury, leg pain EXAM: RIGHT TIBIA AND FIBULA - 2 VIEW COMPARISON:  None. FINDINGS: Negative for acute fracture. Total knee replacement without complication. Arterial calcification. IMPRESSION: Negative for acute fracture. Electronically Signed   By: Franchot Gallo M.D.   On: 07/21/2019 15:53   CT Head Wo Contrast  Result Date: 07/21/2019 CLINICAL DATA:  Left arm numbness and tingling. EXAM: CT HEAD WITHOUT CONTRAST TECHNIQUE: Contiguous axial images were obtained from the base of the skull through the vertex without intravenous contrast. COMPARISON:  October 26, 2016 FINDINGS: Brain: There is mild cerebral atrophy with widening of the extra-axial spaces and ventricular dilatation. There are areas of decreased attenuation within the white matter tracts of the supratentorial brain, consistent with microvascular disease changes. There is a small chronic right para thalamic lacunar infarct.  Vascular: No hyperdense vessel or unexpected calcification. Skull: Normal. Negative for fracture or focal lesion. Sinuses/Orbits: No acute finding. Other: None. IMPRESSION: 1. No acute intracranial abnormality. 2. Mild cerebral atrophy and microvascular disease changes of the supratentorial brain. 3. Small chronic right para thalamic lacunar infarct. Electronically Signed   By: Virgina Norfolk M.D.   On: 07/21/2019 15:56   CT Cervical Spine Wo Contrast  Result Date: 07/21/2019 CLINICAL DATA:  Left arm numbness and tingling. EXAM: CT CERVICAL SPINE WITHOUT CONTRAST TECHNIQUE: Multidetector CT imaging of the cervical spine was performed without intravenous contrast. Multiplanar CT image reconstructions were also generated. COMPARISON:  None. FINDINGS: Alignment: There is approximately 2 mm anterolisthesis of the C7 on T1 vertebral body. Skull base and vertebrae: A metallic density  fusion plate and screws are seen along the anterior aspects of the C5, C6 and C7 vertebral bodies. No acute fracture. No primary bone lesion or focal pathologic process. Soft tissues and spinal canal: No prevertebral fluid or swelling. No visible canal hematoma. Disc levels: C2-3: There is mild end plate spondylosis. Mild disc space narrowing is seen. Bilateral facet hypertrophy is noted. Normal central canal and intervertebral neuroforamina. C3-4: There is mild to moderate severity end plate spondylosis. Moderate severity disc space narrowing is seen. Bilateral facet hypertrophy is noted. Normal central canal and intervertebral neuroforamina. C4-5: There is mild end plate spondylosis. Moderate severity disc space narrowing is seen. Bilateral facet hypertrophy is noted. Normal central canal and intervertebral neuroforamina. C5-6: There is moderate severity end plate spondylosis. Hyperdense operative material is seen within the intervertebral disc space. Bilateral facet hypertrophy is noted. Normal central canal and intervertebral  neuroforamina. C6-7: There is moderate severity end plate spondylosis. Hyperdense operative material is seen within the intervertebral disc space. Bilateral facet hypertrophy is noted. Normal central canal and intervertebral neuroforamina. C7-T1: There is mild end plate spondylosis. Mild to moderate severity disc space narrowing is seen. Bilateral facet hypertrophy is noted. Normal central canal and intervertebral neuroforamina. Upper chest: Negative. Other: None. IMPRESSION: 1. Multilevel degenerative disc and facet disease. 2. Postoperative changes at the levels of C5-C6 and C6-C7. 3. There is approximately 2 mm anterolisthesis of C7 on T1 vertebral body. Electronically Signed   By: Virgina Norfolk M.D.   On: 07/21/2019 16:00   CT ABDOMEN PELVIS W CONTRAST  Result Date: 07/21/2019 CLINICAL DATA:  Abdominal pain. EXAM: CT ABDOMEN AND PELVIS WITH CONTRAST TECHNIQUE: Multidetector CT imaging of the abdomen and pelvis was performed using the standard protocol following bolus administration of intravenous contrast. CONTRAST:  137mL OMNIPAQUE IOHEXOL 300 MG/ML  SOLN COMPARISON:  October 27, 2017 FINDINGS: Lower chest: No acute abnormality. Hepatobiliary: No focal liver abnormality is seen. Status post cholecystectomy. No biliary dilatation. Pancreas: Spleen: Adrenals/Urinary Tract: Adrenal glands are unremarkable. Kidneys are normal in size, without renal calculi or hydronephrosis. Subcentimeter simple cysts are seen within both kidneys. Bladder is unremarkable. Stomach/Bowel: There is a large gastric hernia. The appendix is not identified. Surgical sutures are seen along the cecum and within the expected region of the distal sigmoid colon. No evidence of bowel wall thickening, distention, or inflammatory changes. Vascular/Lymphatic: Moderate severity aortic atherosclerosis. No enlarged abdominal or pelvic lymph nodes. Reproductive: Status post hysterectomy. No adnexal masses. Other: No abdominal wall hernia or  abnormality. No abdominopelvic ascites. Musculoskeletal: Multilevel degenerative changes seen throughout the lumbar spine IMPRESSION: 1. Large gastric hernia. 2. Status post cholecystectomy and hysterectomy. 3. Moderate severity aortic atherosclerosis. 4. Multilevel degenerative changes throughout the lumbar spine. Aortic Atherosclerosis (ICD10-I70.0). Electronically Signed   By: Virgina Norfolk M.D.   On: 07/21/2019 18:23   US Venous Img Lower Unilateral Right  Result Date: 07/21/2019 CLINICAL DATA:  86 year old female with a history of right leg swelling EXAM: RIGHT LOWER EXTREMITY VENOUS DOPPLER ULTRASOUND TECHNIQUE: Gray-scale sonography with graded compression, as well as color Doppler and duplex ultrasound were performed to evaluate the lower extremity deep venous systems from the level of the common femoral vein and including the common femoral, femoral, profunda femoral, popliteal and calf veins including the posterior tibial, peroneal and gastrocnemius veins when visible. The superficial great saphenous vein was also interrogated. Spectral Doppler was utilized to evaluate flow at rest and with distal augmentation maneuvers in the common femoral, femoral and popliteal veins. COMPARISON:  None. FINDINGS: Contralateral Common Femoral Vein: Respiratory phasicity is normal and symmetric with the symptomatic side. No evidence of thrombus. Normal compressibility. Common Femoral Vein: No evidence of thrombus. Normal compressibility, respiratory phasicity and response to augmentation. Saphenofemoral Junction: No evidence of thrombus. Normal compressibility and flow on color Doppler imaging. Profunda Femoral Vein: No evidence of thrombus. Normal compressibility and flow on color Doppler imaging. Femoral Vein: No evidence of thrombus. Normal compressibility, respiratory phasicity and response to augmentation. Popliteal Vein: No evidence of thrombus. Normal compressibility, respiratory phasicity and response to  augmentation. Calf Veins: Calf veins not well visualized Superficial Great Saphenous Vein: No evidence of thrombus. Normal compressibility and flow on color Doppler imaging. Other Findings: There are 2 separate heterogeneously hypoechoic fluid collection/soft tissue in the right calf. One measures 3.4 cm x 1.6 cm x 2.9 cm. One measures 3.5 cm x 1.1 cm x 3.6 cm. IMPRESSION: Sonographic survey of the right lower extremity negative for DVT. There are 2 focal regions of soft tissue/fluid in the right calf measuring 3.4 cm and 3.6 cm, respectively. Nonspecific ultrasound findings, with the differential including hematoma/seroma or other soft tissue lesions. Correlation with the patient's presentation Gonce be useful. Electronically Signed   By: Corrie Mckusick D.O.   On: 07/21/2019 15:39   DG Chest Portable 1 View  Result Date: 08/03/2019 CLINICAL DATA:  Shortness of breath EXAM: PORTABLE CHEST 1 VIEW COMPARISON:  07/21/2019 FINDINGS: The heart size and mediastinal contours are within normal limits. Both lungs are clear. The visualized skeletal structures are unremarkable. IMPRESSION: No active disease. Electronically Signed   By: Ulyses Jarred M.D.   On: 08/03/2019 00:15   DG Foot Complete Right  Result Date: 07/21/2019 CLINICAL DATA:  Blunt trauma to foot several days ago with persistent pain and swelling, initial encounter EXAM: RIGHT FOOT COMPLETE - 3+ VIEW COMPARISON:  None. FINDINGS: No acute fracture or dislocation is noted. Mild soft tissue swelling is seen. Degenerative changes of the tarsal bones are seen. Healed trauma in the second proximal phalanx is noted. IMPRESSION: Soft tissue swelling without acute bony abnormality. Electronically Signed   By: Inez Catalina M.D.   On: 07/21/2019 15:51    Microbiology: Recent Results (from the past 240 hour(s))  SARS CORONAVIRUS 2 (TAT 6-24 HRS) Nasopharyngeal Nasopharyngeal Swab     Status: None   Collection Time: 08/02/19 11:50 PM   Specimen: Nasopharyngeal  Swab  Result Value Ref Range Status   SARS Coronavirus 2 NEGATIVE NEGATIVE Final    Comment: (NOTE) SARS-CoV-2 target nucleic acids are NOT DETECTED. The SARS-CoV-2 RNA is generally detectable in upper and lower respiratory specimens during the acute phase of infection. Negative results do not preclude SARS-CoV-2 infection, do not rule out co-infections with other pathogens, and should not be used as the sole basis for treatment or other patient management decisions. Negative results must be combined with clinical observations, patient history, and epidemiological information. The expected result is Negative. Fact Sheet for Patients: SugarRoll.be Fact Sheet for Healthcare Providers: https://www.woods-mathews.com/ This test is not yet approved or cleared by the [place] FDA and  has been authorized for detection and/or diagnosis of SARS-CoV-2 by FDA under an Emergency Use Authorization (EUA). This EUA will remain  in effect (meaning this test can be used) for the duration of the COVID-19 declaration under Section 56 4(b)(1) of the Act, 21 U.S.C. section 360bbb-3(b)(1), unless the authorization is terminated or revoked sooner. Performed at [facility], [place] [address], [place], [place] [address]  Urine culture     Status: Abnormal   Collection Time: 08/03/19  8:13 AM   Specimen: Urine, Random  Result Value Ref Range Status   Specimen Description   Final    URINE, RANDOM Performed at [facility] [address], [place], [place] [address]    Special Requests   Final    NONE Performed at [facility], [place] [address] [place], [place] [address]    Culture 20,000 COLONIES/mL PSEUDOMONAS AERUGINOSA (A)  Final   Report Status 08/05/2019 FINAL  Final   Organism ID, Bacteria PSEUDOMONAS AERUGINOSA (A)  Final      Susceptibility   Pseudomonas aeruginosa - MIC*    CEFTAZIDIME 8 SENSITIVE  Sensitive     CIPROFLOXACIN <=0.25 SENSITIVE Sensitive     GENTAMICIN 4 SENSITIVE Sensitive     IMIPENEM 2 SENSITIVE Sensitive     PIP/TAZO 16 SENSITIVE Sensitive     * 20,000 COLONIES/mL PSEUDOMONAS AERUGINOSA  Culture, blood (routine x 2)     Status: None (Preliminary result)   Collection Time: 08/03/19  1:51 PM   Specimen: BLOOD  Result Value Ref Range Status   Specimen Description   Final    BLOOD RIGHT ARM Performed at [facility] [address], [place], [place] [address]    Special Requests   Final    BOTTLES DRAWN AEROBIC AND ANAEROBIC Blood Culture adequate volume Performed at [facility] [address], [place], [place] [address]    Culture   Final    NO GROWTH 4 DAYS Performed at [facility], [place] [address] [place], [place] [address]    Report Status PENDING  Incomplete  Culture, blood (routine x 2)     Status: None (Preliminary result)   Collection Time: 08/03/19  1:51 PM   Specimen: BLOOD  Result Value Ref Range Status   Specimen Description   Final    BLOOD RIGHT ARM Performed at [facility] [address] [place], [place] [address]    Special Requests   Final    BOTTLES DRAWN AEROBIC AND ANAEROBIC Blood Culture adequate volume Performed at [facility] [address], [place], [place] [address]    Culture   Final    NO GROWTH 4 DAYS Performed at [facility], [place] [address] [place], [place] [address]    Report Status PENDING  Incomplete     Labs: Basic Metabolic Panel: Recent Labs  Lab 08/02/19 2338 08/03/19 1351 08/04/19 0405 08/05/19 0521 08/06/19 0526  NA 139 141 139  --  140  K 3.6 4.2 4.1  --  4.2  CL 106 106 105  --  104  CO2 24 25 26   --  27  GLUCOSE 180* 108* 105*  --  99  BUN 51* 33* 31*  --  31*  CREATININE 1.46* 1.08* 1.20* 1.72* 1.23*  CALCIUM 8.9 9.0 8.6*  --  8.8*  MG  --  2.1  --   --   --    Liver Function Tests: Recent Labs   Lab 08/02/19 2338 08/03/19 1351  AST 23 26  ALT 16 18  ALKPHOS 55 57  BILITOT 0.8 0.8  PROT 7.3 7.4  ALBUMIN 3.7 3.7   No results for input(s): LIPASE, AMYLASE in the last 168 hours. No results for input(s): AMMONIA in the last 168 hours. CBC: Recent Labs  Lab 08/02/19 2338 08/03/19 1351 08/03/19 2139 08/06/19 0526  WBC 7.2 8.2 9.2 6.8  NEUTROABS 5.2  --  7.5 4.6  HGB 11.6* 11.2* 10.9* 11.0*  HCT 37.5 36.3 35.5* 35.2*  MCV 99.5 100.0 99.2 97.2  PLT 182 189 196 181   Cardiac Enzymes: No results for input(s): CKTOTAL, CKMB, CKMBINDEX, TROPONINI in the last 168 hours. BNP: BNP (last 3 results) Recent Labs    07/21/19 1613 08/02/19 2338  BNP 354.5* 345.2*    ProBNP (last 3 results) No results for input(s): PROBNP in the last 8760 hours.  CBG: No results for input(s): GLUCAP in the last 168 hours.  Principal Problem:   Cellulitis Active Problems:   Depression   Essential hypertension   Coronary atherosclerosis   DIASTOLIC HEART FAILURE, CHRONIC   GERD   Neuropathic pain   Insomnia   Chronic atrial fibrillation (HCC)   Chronic anticoagulation   Centrilobular emphysema (HCC)   CKD (chronic kidney disease), stage III   Time coordinating discharge: 38 minutes.  Signed:        Laray Rivkin, DO [facility]  08/07/2019, 6:02 PM

## 2019-08-08 LAB — CULTURE, BLOOD (ROUTINE X 2)
Culture: NO GROWTH
Culture: NO GROWTH
Special Requests: ADEQUATE
Special Requests: ADEQUATE

## 2019-08-09 ENCOUNTER — Other Ambulatory Visit: Payer: Self-pay | Admitting: Pharmacist

## 2019-08-09 ENCOUNTER — Telehealth: Payer: Self-pay

## 2019-08-09 DIAGNOSIS — I5032 Chronic diastolic (congestive) heart failure: Secondary | ICD-10-CM | POA: Diagnosis not present

## 2019-08-09 DIAGNOSIS — M79671 Pain in right foot: Secondary | ICD-10-CM | POA: Diagnosis not present

## 2019-08-09 DIAGNOSIS — R768 Other specified abnormal immunological findings in serum: Secondary | ICD-10-CM | POA: Diagnosis not present

## 2019-08-09 DIAGNOSIS — M1180 Other specified crystal arthropathies, unspecified site: Secondary | ICD-10-CM | POA: Diagnosis not present

## 2019-08-09 DIAGNOSIS — N183 Chronic kidney disease, stage 3 unspecified: Secondary | ICD-10-CM | POA: Diagnosis not present

## 2019-08-09 DIAGNOSIS — M109 Gout, unspecified: Secondary | ICD-10-CM | POA: Diagnosis not present

## 2019-08-09 DIAGNOSIS — M79645 Pain in left finger(s): Secondary | ICD-10-CM | POA: Diagnosis not present

## 2019-08-09 DIAGNOSIS — M159 Polyosteoarthritis, unspecified: Secondary | ICD-10-CM | POA: Diagnosis not present

## 2019-08-09 DIAGNOSIS — M7989 Other specified soft tissue disorders: Secondary | ICD-10-CM | POA: Diagnosis not present

## 2019-08-09 DIAGNOSIS — M797 Fibromyalgia: Secondary | ICD-10-CM | POA: Diagnosis not present

## 2019-08-09 NOTE — Telephone Encounter (Signed)
Transition Care Management Follow-up Telephone Call  Date of discharge and from where: 08/07/19 from [facility]  How have you been since you were released from the hospital? "my leg is a lot better than it was".  Any questions or concerns? No   Items Reviewed:  Did the pt receive and understand the discharge instructions provided? Yes   Medications obtained and verified? Yes   Any new allergies since your discharge? No   Dietary orders reviewed? Yes  Do you have support at home? Yes   Other (ie: DME, Home Health, etc) N/A  Functional Questionnaire: (I = Independent and D = Dependent) ADL's: "I can do most things for myself"  Bathing/Dressing-    Meal Prep-   Eating-   Maintaining continence-   Transferring/Ambulation-   Managing Meds-    Follow up appointments reviewed:    PCP Hospital f/u appt confirmed? Yes  Scheduled to see [name] on 08/13/19 1:00pm in office.   [facility] f/u appt confirmed? No  N/a  Are transportation arrangements needed? No   If their condition worsens, is the pt aware to call  their PCP or go to the ED? Yes  Was the patient provided with contact information for the PCP's office or ED? Yes  Was the pt encouraged to call back with questions or concerns? Yes

## 2019-08-09 NOTE — Patient Outreach (Addendum)
[name]) Care Management  [name]   08/09/2019  [name] 01/22/1930 [id number]  Reason for referral: Medication Assistance, Medication Reconciliation Post Discharge  Referral source: [employer] C-SNP Care Manager with [name] Current insurance: [employer] C-SNP  PMHx includes but not limited to:   CHF, anxiety, depression, history of colon cancer, A fib on chronic anticoagulation,  CAD, coronary atherosclerosis, hypertension, GERD, insomnia, osteoarthritis, and recent hospitalization for complications from cellulitis.  Outreach:  Successful telephone call with patient.  HIPAA identifiers verified.   Subjective:  Patient is an 86 year old female with the above mentioned medical conditions. She was recently hospitalized for worsening cellulitis of the right lower extremity.    Objective: The ASCVD Risk score Whitney Bussing DC Jr., et al., 2013) failed to calculate for the following reasons:   The 2013 ASCVD risk score is only valid for ages 37 to 55  Lab Results  Component Value Date   CREATININE 1.23 (H) 08/06/2019   CREATININE 1.72 (H) 08/05/2019   CREATININE 1.20 (H) 08/04/2019    Lab Results  Component Value Date   HGBA1C 5.9 (H) 05/31/2014    Lipid Panel     Component Value Date/Time   CHOL 130 01/19/2013 1014   TRIG 87.0 01/19/2013 1014   HDL 33.60 (L) 01/19/2013 1014   CHOLHDL 4 01/19/2013 1014   VLDL 17.4 01/19/2013 1014   LDLCALC 79 01/19/2013 1014    BP Readings from Last 3 Encounters:  08/07/19 116/67  07/30/19 110/66  07/28/19 118/66    No Known Allergies  Medications Reviewed Today    Reviewed by Elayne Guerin, [place] (Pharmacist) on 08/09/19 at 1000  Med List Status: <None>  Medication Order Taking? Sig Documenting Provider Last Dose Status Informant  acetaminophen (TYLENOL) 325 MG tablet [id number] Yes Take 650 mg by mouth every 6 (six) hours as needed for moderate pain.  [provider] Taking  Active Self  albuterol (PROAIR HFA) 108 (90 Base) MCG/ACT inhaler [id number] Yes Inhale 2 puffs into the lungs every 6 (six) hours as needed for wheezing or shortness of breath. [name], MD Taking Active Self  allopurinol (ZYLOPRIM) 100 MG tablet [id number] Yes Take 100 mg by mouth daily. [provider] Taking Active Self  apixaban (ELIQUIS) 5 MG TABS tablet [id number] Yes Take 1 tablet (5 mg total) by mouth 2 (two) times daily. Nafziger, [name], NP Taking Active Self  Ascorbic Acid (VITAMIN C) 1000 MG tablet [id number] Yes Take 1,000 mg by mouth daily. [provider] Taking Active Self  benazepril (LOTENSIN) 40 MG tablet [id number] Yes Take 1 tablet (40 mg total) by mouth daily. Nafziger, [name], NP Taking Active Self  cefdinir (OMNICEF) 300 MG capsule [id number] Yes Take 1 capsule (300 mg total) by mouth 2 (two) times daily for 5 days. Swayze, Ava, DO Taking Active   esomeprazole (NEXIUM) 40 MG capsule [id number] Yes Take 1 capsule (40 mg total) by mouth daily. Nafziger, [name], NP Taking Active Self  ferrous sulfate 325 (65 FE) MG EC tablet [id number] Yes Take 325 mg by mouth daily with breakfast. [provider] Taking Active Self  fluticasone (FLOVENT HFA) 44 MCG/ACT inhaler [id number] Yes Inhale 2 puffs into the lungs 2 (two) times daily. Lucretia Kern, DO Taking Active Self  furosemide (LASIX) 20 MG tablet [id number] Yes Take 2 tablets (40 mg total) by mouth daily. Please keep upcoming appt in November with Dr. Acie Fredrickson before anymore refills. Thank you [name], Arnette Norris  J, MD Taking Active Self  gabapentin (NEURONTIN) 600 MG tablet [id number] Yes Take 1 tablet (600 mg total) by mouth 2 (two) times daily. Nafziger, [name], NP Taking Active Self  HYDROcodone-acetaminophen (NORCO/VICODIN) 5-325 MG tablet [id number] Yes Take 1-2 tablets by mouth every 6 (six) hours as needed for severe pain. Swayze, Ava, DO Taking Active   hydrocortisone (ANUSOL-HC) 2.5 % rectal cream [id number] Yes  Place 1 application rectally 2 (two) times daily. Whitney Shipper, MD Taking Active Self  LORazepam (ATIVAN) 0.5 MG tablet [id number] Yes 1 tablet twice daily as needed for anxiety or sleep  Patient taking differently: Take 0.5 mg by mouth 2 (two) times daily as needed for anxiety or sleep.    Nafziger, [name], NP Taking Active Self  metoprolol tartrate (LOPRESSOR) 50 MG tablet [id number]  Take 1.5 tablets (75 mg total) by mouth 2 (two) times daily. Whitney Peng, NP  Expired 08/03/19 2359 Self  nitroGLYCERIN (NITROSTAT) 0.4 MG SL tablet [id number] Yes Place 1 tablet (0.4 mg total) under the tongue every 5 (five) minutes as needed for chest pain (x 3 pills). Reported on 09/01/2015 Whitney Peng, NP Taking Active Self  predniSONE (DELTASONE) 20 MG tablet [id number] Yes Take 1 tablet (20 mg total) by mouth daily with breakfast. Swayze, Ava, DO Taking Active   sertraline (ZOLOFT) 50 MG tablet [id number] Yes Take 1 tablet (50 mg total) by mouth daily. Whitney Peng, NP Taking Active Self  Spacer/Aero-Holding Chambers (E-Z SPACER) inhaler [id number] Yes Use as instructed Whitney Macadam, MD Taking Active Self  Tiotropium Bromide Monohydrate (SPIRIVA RESPIMAT) 2.5 MCG/ACT AERS [id number] Yes Inhale 2 puffs into the lungs daily. [name], MD Taking Active Self  traZODone (DESYREL) 100 MG tablet [id number] Yes TAKE 1/2 (ONE-HALF) TABLET BY MOUTH AT BEDTIME  Patient taking differently: Take 50 mg by mouth at bedtime.    Whitney Peng, NP Taking Active Self         ASSESSMENT: Date Discharged from Hospital: 08/07/2019 Date Medication Reconciliation Performed: 08/09/2019  Medications changes at Discharge:  START taking: cefdinir (OMNICEF) predniSONE (DELTASONE) Start taking on: August 08, 2019  CHANGE how you take: HYDROcodone-acetaminophen (NORCO/VICODIN)  STOP taking: cephALEXin 500 MG capsule (KEFLEX) colchicine 0.6 MG tablet diltiazem 120 MG 24 hr capsule (CARDIZEM CD) triamcinolone  cream 0.1 % (KENALOG)  Patient was recently discharged from hospital and all medications have been reviewed.  Drugs sorted by system:  Neurologic/Psychologic: Gabapentin, Lorazepam, Sertraline   Cardiovascular:  Benazepril, Furosemide, Metoprolol Tartrate, Nitroglycerin SL tablets  Pulmonary/Allergy: Albuterol, Fluticasone   Gastrointestinal: Esomeprazole, Hydrocortisone rectal cream,   Renal: Allopurinol  Pain: Hydrocodone-acetaminophen, Prednisone  Infectious Diseases: Cefdinir  Vitamins/Minerals/Supplements: Vitamin C, Ferrous Sulfate  Medication Review Findings:   Once patient has new labs drawn, the gabapentin dose Guallpa need to be decreased to meet manufacturer guidelines.  Patient was still unclear about Flovent.  Medication Assistance Findings:  Medication assistance needs identified: Spiriva and Flovent Clarification needed on Flovent. Patient has PCP appointment 08/13/2019  Extra Help:  Not eligible for Extra Help Low Income Subsidy based on reported income and assets  Patient Assistance Programs: Spiriva  made by [place] requirement met: Yes o Out-of-pocket prescription expenditure met:   Not Applicable - Patient has met application requirements to apply for this program.    Plan: . Will contact PCP regarding Flovent.  . Will route note to PCP.  Marland Kitchen Will follow-up in 1 week.   Elayne Guerin, PharmD, [place] Clinical Pharmacist [phone]

## 2019-08-10 ENCOUNTER — Other Ambulatory Visit: Payer: Self-pay

## 2019-08-10 NOTE — Patient Outreach (Addendum)
  [facility]) Care Management Chronic Special Needs Program  08/10/2019  Name: [name] DOB: 1930-01-20  MRN: [id number]  Ms. Shaylen Brosky is enrolled in a chronic special needs plan for Heart Failure. Reviewed and updated care plan.  Client was admitted to [facility] on 08/02/19 with cellulitis of right lower extremity and discharged home on 08/07/19  Goals Addressed            This Visit's Progress   . Client will verbalize understanding of treatment plan for impaired skin integrity(rt leg cellulitis) and follow up with provider by next 6 months   Not on track    Keep legs elevated Keep follow up appointment with provider Call provider for increased swelling, redness or open skin    . General - Client will not be readmitted within 30 days (C-SNP)discharged 08/07/19       Please follow discharge instructions and call provider if you have any questions. Please attend all follow up appointments as scheduled. Please take your medications as prescribed. Please call 24 Hour nurse advice line as needed [phone]).      Transition of care call completed through [place] discharge call.  RNCM to follow up on any red flags alerts Transition of care call also completed by primary care office [place] pharmacist completed medication review  Plan:  Send  outreach letter with a copy of their individualized care plan and Send individual care plan to provider  Chronic care management coordinator will outreach in:  One month or sooner if needed    Peter Garter RN, Jackquline Denmark, [place] Management Coordinator [place] Management [phone]

## 2019-08-12 ENCOUNTER — Other Ambulatory Visit: Payer: Self-pay

## 2019-08-13 ENCOUNTER — Ambulatory Visit (INDEPENDENT_AMBULATORY_CARE_PROVIDER_SITE_OTHER): Payer: HMO | Admitting: Adult Health

## 2019-08-13 ENCOUNTER — Encounter: Payer: Self-pay | Admitting: Adult Health

## 2019-08-13 VITALS — BP 120/80 | HR 86 | Temp 98.1°F | Ht 63.5 in | Wt 187.0 lb

## 2019-08-13 DIAGNOSIS — Z760 Encounter for issue of repeat prescription: Secondary | ICD-10-CM

## 2019-08-13 DIAGNOSIS — L03115 Cellulitis of right lower limb: Secondary | ICD-10-CM | POA: Diagnosis not present

## 2019-08-13 MED ORDER — HYDROCODONE-ACETAMINOPHEN 5-325 MG PO TABS: 1.0000 | ORAL_TABLET | Freq: Four times a day (QID) | ORAL | 0 refills | Status: DC | PRN

## 2019-08-13 MED ORDER — SERTRALINE HCL 50 MG PO TABS: 50.0000 mg | ORAL_TABLET | Freq: Every day | ORAL | 1 refills | Status: DC

## 2019-08-13 MED ORDER — CEFDINIR 300 MG PO CAPS: 300.0000 mg | ORAL_CAPSULE | Freq: Two times a day (BID) | ORAL | 0 refills | Status: DC

## 2019-08-13 MED ORDER — LORAZEPAM 0.5 MG PO TABS: 0.5000 mg | ORAL_TABLET | Freq: Two times a day (BID) | ORAL | 1 refills | Status: DC | PRN

## 2019-08-13 MED ORDER — BENAZEPRIL HCL 40 MG PO TABS: 40.0000 mg | ORAL_TABLET | Freq: Every day | ORAL | 1 refills | Status: DC

## 2019-08-13 MED ORDER — METOPROLOL TARTRATE 50 MG PO TABS: 75.0000 mg | ORAL_TABLET | Freq: Two times a day (BID) | ORAL | 1 refills | Status: DC

## 2019-08-13 MED ORDER — ESOMEPRAZOLE MAGNESIUM 40 MG PO CPDR: 40.0000 mg | DELAYED_RELEASE_CAPSULE | Freq: Every day | ORAL | 1 refills | Status: DC

## 2019-08-13 MED ORDER — TRAZODONE HCL 50 MG PO TABS: 50.0000 mg | ORAL_TABLET | Freq: Every day | ORAL | 1 refills | Status: DC

## 2019-08-13 NOTE — Progress Notes (Signed)
Subjective:    Patient ID: [name], female    DOB: 26-Troost-1931, 86 y.o.   MRN: [id number]  HPI 85 year old female who  has a past medical history of Adenocarcinoma, colon ([place]) (Oncologist-- Dr Truitt Merle), Allergy, Anxiety, Asthma, CAD (coronary artery disease), Cataract, Chronic anemia, Chronic diastolic CHF (congestive heart failure) ([place]), Clotting disorder ([place]), Colostomy in place [facility]), Colovaginal fistula, COPD with chronic bronchitis ([place]), Depression, Dysrhythmia, Essential hypertension, GERD (gastroesophageal reflux disease), History of adenomatous polyp of colon, History of blood transfusion, History of DVT of lower extremity, History of hiatal hernia, History of TIA (transient ischemic attack), Hyperlipidemia, OA (osteoarthritis), Osteoporosis, Peripheral neuropathy, Pneumonia, Pulmonary HTN ([place]), Renal insufficiency, Sigmoid diverticulosis, Stroke ([place]), Wears dentures, and Wears glasses.  She presents to the office today for TCM visit  She presents with the daughter to this visit   Admit Date: 08/02/2019 Discharge Date: 08/07/2019  She presented to the ER for worsening redness/swelling/pain of right lower extremity despite 2 different outpatient antibiotics.  On 1226 she was opening a drawer at home and hit her right lower extremity and there was a bruise; she denies open wound.  Shortly therefore she began to notice redness and swelling which had continued to worsen despite trying doxycycline and then Keflex which was prescribed by another provider in the office.  The ER the patient reported worsening pain as well as clear drainage.  Initially the redness was more localized to below her shin but was now extending towards her knee.  Due to the pain she had not been eating much over the last week.  She denied fever or chills, cough, shortness of breath, chest pain, abdominal pain, nausea, vomiting, diarrhea, constipation, dysuria, hematuria, or confusion.  In the ER her vitals were  stable save for elevated blood pressure.  Labs were remarkable for: Lactate within normal limits, WC BC 7.2, glucose 180, creatinine 1.46, BNP 345.  UA collected.  He was started on vancomycin and admitted to the hospital.  Urine culture had grown out Pseudomonas.  Rocephin has been changed to this cefepime for better coverage pending sensitivities.  Unfortunately she developed a gouty flare of her right great toe and was given a burst of prednisone for this.  The erythremia, warmth, and swelling of the right lower extremity resolved as did the tenderness, erythema and warmth of the right great toe.  She was discharged on Omnicef 300 mg twice daily x5 days, a prednisone taper, and hydrocodone for pain.  Today she reports that she is doing well since being discharged from the hospital but that unfortunately she is still having considerable pain in her right foot although this has improved by about 50% when she was admitted to the hospital.  Her right foot continues to be swollen but this has improved as well and has some continued discoloration.  She is denying any pain in her right great toe and feels as though with her gout has resolved.  She has a hard time getting to sleep due to the pain and will take hydrocodone as necessary.   Review of Systems  Constitutional: Negative.   HENT: Negative.   Eyes: Negative.   Respiratory: Negative.   Cardiovascular: Negative.   Gastrointestinal: Negative.   Endocrine: Negative.   Genitourinary: Negative.   Musculoskeletal: Positive for gait problem and joint swelling.  Hematological: Negative.   Psychiatric/Behavioral: Negative.   All other systems reviewed and are negative.  Past Medical History:  Diagnosis Date  . Adenocarcinoma, colon (  [place]) Oncologist-- Dr Truitt Merle   Multifocal (2) Colon cancer @ ileocecal valve and ascending ( [id number]), Stage IIIB, grade I, MMR normal , 2 of 16 +lymph nodes, negative surgical margins---  06-02-2014  Right  hemicolectomy w/ colostomy  . Allergy   . Anxiety   . Asthma    inhaler "sometimes"  . CAD (coronary artery disease)    a. LHC 4/09: pLAD 40, mDx 70-75, pLCx 30, mLCx 50, mRCA 90, EF 60% >> PCI: BMS x2 to RCA;  b. Myoview 8/12: normal  . Cataract    bil cateracts removed  . Chronic anemia   . Chronic diastolic CHF (congestive heart failure) (HCC)    a. Echo 912 - Mild LVH, EF 55-60%, no RWMA, Gr 1 DD, mild MR, mild LAE, mild RAE, PASP 59 mmHg (mod to severe pulmo HTN)  . Clotting disorder (HCC)    hx of dvt, tia on eliquis  . Colostomy in place [facility])    s/p colostomy takedown 5/16  . Colovaginal fistula    s/p colostomy >> colostomy takedown 5/16  . COPD with chronic bronchitis ([place])   . Depression   . Dysrhythmia    A fib  . Essential hypertension   . GERD (gastroesophageal reflux disease)   . History of adenomatous polyp of colon   . History of blood transfusion   . History of DVT of lower extremity   . History of hiatal hernia   . History of TIA (transient ischemic attack)    a. Head CT 6/15: small chronic lacunar infarct in thalamus  . Hyperlipidemia   . OA (osteoarthritis)   . Osteoporosis   . Peripheral neuropathy   . Pneumonia   . Pulmonary HTN ([place])    a. PASP on Echo in 9/12:  59 mmHg  . Renal insufficiency   . Sigmoid diverticulosis    s/p sigmoid colectomy  . Stroke ([place])    TIAs  . Wears dentures   . Wears glasses     Social History   Socioeconomic History  . Marital status: Widowed    Spouse name: Not on file  . Number of children: Not on file  . Years of education: Not on file  . Highest education level: Not on file  Occupational History  . Not on file  Tobacco Use  . Smoking status: Former Smoker    Packs/day: 0.50    Years: 16.00    Pack years: 8.00    Types: Cigarettes    Start date: 69    Quit date: 07/16/1967    Years since quitting: 52.1  . Smokeless tobacco: Never Used  Substance and Sexual Activity  . Alcohol use: No     Alcohol/week: 0.0 standard drinks  . Drug use: No  . Sexual activity: Not on file  Other Topics Concern  . Not on file  Social History Narrative   Married, 2 children.  As of November 2015 her husband has been living in a nursing home for tendon after years as he suffers from multiple medical problems.   Patient does not drink alcohol nor does she smoke or chew tobacco products   Right handed   8th grade   1 cup daily   Social Determinants of Health   Financial Resource Strain:   . Difficulty of Paying Living Expenses: Not on file  Food Insecurity: No Food Insecurity  . Worried About Charity fundraiser in the Last Year: Never true  . Ran Out of Food  in the Last Year: Never true  Transportation Needs: No Transportation Needs  . Lack of Transportation (Medical): No  . Lack of Transportation (Non-Medical): No  Physical Activity:   . Days of Exercise per Week: Not on file  . Minutes of Exercise per Session: Not on file  Stress:   . Feeling of Stress : Not on file  Social Connections:   . Frequency of Communication with Friends and Family: Not on file  . Frequency of Social Gatherings with Friends and Family: Not on file  . Attends Religious Services: Not on file  . Active Member of Clubs or Organizations: Not on file  . Attends Archivist Meetings: Not on file  . Marital Status: Not on file  Intimate Partner Violence:   . Fear of Current or Ex-Partner: Not on file  . Emotionally Abused: Not on file  . Physically Abused: Not on file  . Sexually Abused: Not on file    Past Surgical History:  Procedure Laterality Date  . ABDOMINAL HYSTERECTOMY    . ANTERIOR CERVICAL DECOMP/DISCECTOMY FUSION  01-31-2009   C5 -- 7  . APPENDECTOMY    . Bone spurs Bilateral    Feet  . CARDIOVASCULAR STRESS TEST  02-26-2011   Normal lexiscan no exercise study/  no ischemia/  normal LV function and wall motion, ef 67%  . CARPAL TUNNEL RELEASE Right   . CATARACT EXTRACTION W/  INTRAOCULAR LENS  IMPLANT, BILATERAL Bilateral   . CHOLECYSTECTOMY    . COLOSTOMY N/A 06/02/2014   Procedure: DIVERTING DESCENDING END COLOSTOMY;  Surgeon: Donnie Mesa, MD;  Location: [facility];  Service: General;  Laterality: N/A;  . CORONARY ANGIOPLASTY WITH STENT PLACEMENT  11-04-2007  dr bensimhon   BMS x2 to RCA/  mild Non-obstructive disease LAD/  normal LVF  . DILATION AND CURETTAGE OF UTERUS    . ESOPHAGOGASTRODUODENOSCOPY N/A 10/28/2017   Procedure: ESOPHAGOGASTRODUODENOSCOPY (EGD);  Surgeon: Yetta Flock, MD;  Location: [facility] ENDOSCOPY;  Service: Gastroenterology;  Laterality: N/A;  . EVALUATION UNDER ANESTHESIA WITH ANAL FISTULECTOMY N/A 10/13/2014   Procedure: ANAL EXAM UNDER ANESTHESIA ;  Surgeon: [name], MD;  Location: [facility];  Service: General;  Laterality: N/A;  . HAND SURGERY     Tendon repair  . LAPAROSCOPIC SIGMOID COLECTOMY N/A 11/24/2014   Procedure: SIGMOID COLECTOMY AND COLSOTOMY CLOSURE;  Surgeon: Donnie Mesa, MD;  Location: [facility];  Service: General;  Laterality: N/A;  . LUMBAR LAMINECTOMY  10-29-2002,  1969   Left  L3 -- 4  decompression  . PARTIAL COLECTOMY N/A 06/02/2014   Procedure: RIGHT PARTIAL COLECTOMY ;  Surgeon: Donnie Mesa, MD;  Location: [facility];  Service: General;  Laterality: N/A;  . PATELLECTOMY Right 09/14/2013   Procedure: PATELLECTOMY;  Surgeon: Meredith Pel, MD;  Location: [facility];  Service: Orthopedics;  Laterality: Right;  . PROCTOSCOPY N/A 10/13/2014   Procedure: RIDGE PROCTOSCOPY;  Surgeon: [name], MD;  Location: [facility];  Service: General;  Laterality: N/A;  . REVISION TOTAL KNEE ARTHROPLASTY Bilateral right  04-02-2011/  left 1996 & 10-05-1999  . ROTATOR CUFF REPAIR Bilateral   . TONSILLECTOMY    . TOTAL KNEE ARTHROPLASTY Bilateral left 1994/  right 2000  . TOTAL KNEE REVISION Left 08/02/2015   Procedure: LEFT FEMORAL REVISION;  Surgeon: Gaynelle Arabian, MD;  Location[facility];  Service: Orthopedics;  Laterality: Left;  .  TRANSTHORACIC ECHOCARDIOGRAM  12-01-2009   Grade I diastolic dysfunction/  ef 60%/  moderate MR/  mild  TR    Family History  Problem Relation Age of Onset  . Osteoarthritis Mother   . Heart failure Mother   . Colon cancer Maternal Uncle 4  . Heart Problems Father   . Cancer Sister 16       ? colon cancer   . Kidney cancer Brother 39  . Esophageal cancer Neg Hx   . Rectal cancer Neg Hx   . Stomach cancer Neg Hx   . Pancreatic cancer Neg Hx     No Known Allergies  Current Outpatient Medications on File Prior to Visit  Medication Sig Dispense Refill  . acetaminophen (TYLENOL) 325 MG tablet Take 650 mg by mouth every 6 (six) hours as needed for moderate pain.     Marland Kitchen albuterol (PROAIR HFA) 108 (90 Base) MCG/ACT inhaler Inhale 2 puffs into the lungs every 6 (six) hours as needed for wheezing or shortness of breath. 6.7 g 6  . allopurinol (ZYLOPRIM) 100 MG tablet Take 100 mg by mouth daily.    Marland Kitchen apixaban (ELIQUIS) 5 MG TABS tablet Take 1 tablet (5 mg total) by mouth 2 (two) times daily. 180 tablet 0  . Ascorbic Acid (VITAMIN C) 1000 MG tablet Take 1,000 mg by mouth daily.    . ferrous sulfate 325 (65 FE) MG EC tablet Take 325 mg by mouth daily with breakfast.    . fluticasone (FLOVENT HFA) 44 MCG/ACT inhaler Inhale 2 puffs into the lungs 2 (two) times daily. 1 Inhaler 0  . furosemide (LASIX) 20 MG tablet Take 2 tablets (40 mg total) by mouth daily. Please keep upcoming appt in November with Dr. Acie Fredrickson before anymore refills. Thank you 180 tablet 3  . gabapentin (NEURONTIN) 600 MG tablet Take 1 tablet (600 mg total) by mouth 2 (two) times daily. 180 tablet 0  . hydrocortisone (ANUSOL-HC) 2.5 % rectal cream Place 1 application rectally 2 (two) times daily. 30 g 1  . nitroGLYCERIN (NITROSTAT) 0.4 MG SL tablet Place 1 tablet (0.4 mg total) under the tongue every 5 (five) minutes as needed for chest pain (x 3 pills). Reported on 09/01/2015 25 tablet 3  . predniSONE (DELTASONE) 20 MG tablet Take  1 tablet (20 mg total) by mouth daily with breakfast. 1 tablet 0  . Spacer/Aero-Holding Chambers (E-Z SPACER) inhaler Use as instructed 1 each 2  . Tiotropium Bromide Monohydrate (SPIRIVA RESPIMAT) 2.5 MCG/ACT AERS Inhale 2 puffs into the lungs daily. 4 g 3  . colchicine 0.6 MG tablet      No current facility-administered medications on file prior to visit.    BP 120/80   Pulse 86   Temp 98.1 F (36.7 C) (Other (Comment))   Ht 5' 3.5" (1.613 m)   Wt 187 lb (84.8 kg)   SpO2 98%   BMI 32.61 kg/m       Objective:   Physical Exam Vitals and nursing note reviewed.  Constitutional:      Appearance: Normal appearance.  Musculoskeletal:        General: Swelling and tenderness present.     Right lower leg: Edema present.     Comments: She continues to have +2 nonpitting edema to the right lower extremity.  Edema is present from below the knee to the ankle.  There is tenderness with palpation throughout the right lower leg.  Some mild discoloration with marked border is present throughout the right lower leg.  No warmth noted.  Skin:    General: Skin is warm and dry.  Findings: Erythema present.  Neurological:     General: No focal deficit present.     Mental Status: She is alert and oriented to person, place, and time.     Gait: Gait abnormal.     Comments: Walks with rolling walker.   Psychiatric:        Mood and Affect: Mood normal.        Behavior: Behavior normal.        Thought Content: Thought content normal.        Judgment: Judgment normal.       Assessment & Plan:  1. Cellulitis of right leg -Reviewed hospital notes, imaging, and labs from ER visit on 07/21/2019 and then from most recent hospital admission.  Questions answered to the best of my ability. -We will keep her on Omnicef for another 5 days as it does not appear as though she has had complete resolution.  Send in a few short course of hydrocodone in case she needs it for breakthrough pain.  She was advised  to follow-up next week if symptoms have not resolved. - cefdinir (OMNICEF) 300 MG capsule; Take 1 capsule (300 mg total) by mouth 2 (two) times daily.  Dispense: 10 capsule; Refill: 0 - HYDROcodone-acetaminophen (NORCO/VICODIN) 5-325 MG tablet; Take 1-2 tablets by mouth every 6 (six) hours as needed for severe pain.  Dispense: 15 tablet; Refill: 0  2. Medication refill  - sertraline (ZOLOFT) 50 MG tablet; Take 1 tablet (50 mg total) by mouth daily.  Dispense: 90 tablet; Refill: 1 - benazepril (LOTENSIN) 40 MG tablet; Take 1 tablet (40 mg total) by mouth daily.  Dispense: 90 tablet; Refill: 1 - esomeprazole (NEXIUM) 40 MG capsule; Take 1 capsule (40 mg total) by mouth daily.  Dispense: 90 capsule; Refill: 1 - metoprolol tartrate (LOPRESSOR) 50 MG tablet; Take 1.5 tablets (75 mg total) by mouth 2 (two) times daily.  Dispense: 270 tablet; Refill: 1  [name], NP

## 2019-08-15 ENCOUNTER — Other Ambulatory Visit: Payer: Self-pay | Admitting: Adult Health

## 2019-08-15 DIAGNOSIS — Z760 Encounter for issue of repeat prescription: Secondary | ICD-10-CM

## 2019-08-19 ENCOUNTER — Telehealth: Payer: Self-pay | Admitting: Adult Health

## 2019-08-19 ENCOUNTER — Other Ambulatory Visit: Payer: Self-pay | Admitting: Pharmacist

## 2019-08-19 ENCOUNTER — Ambulatory Visit: Payer: Self-pay | Admitting: Pharmacist

## 2019-08-19 NOTE — Patient Outreach (Signed)
[facility] [facility]) Care Management  08/19/2019  Brandi Clugston Holderness 1929-10-03 [id number]   Patient was called regarding medication assistance. Unfortunately, she did not answer her phone. HIPAA compliant message was left on both numbers listed in her chart.  Plan: Call patient back in 10-14 business days.  Elayne Guerin, PharmD, [place] Clinical Pharmacist [phone]

## 2019-08-19 NOTE — Chronic Care Management (AMB) (Signed)
  Chronic Care Management   Note  08/19/2019 Name: Eliora Nienhuis Kowaleski MRN: [id number] DOB: 1929/07/28  Gerhard Munch Dicola is a 86 y.o. year old female who is a primary care patient of Nafziger, [name], NP. I reached out to [place] by phone today in response to a referral sent by Ms. Gerhard Munch Null's PCP, [name], NP.   Ms. Vullo was given information about Chronic Care Management services today including:  1. CCM service includes personalized support from designated clinical staff supervised by her physician, including individualized plan of care and coordination with other care providers 2. 24/7 contact phone numbers for assistance for urgent and routine care needs. 3. Service will only be billed when office clinical staff spend 20 minutes or more in a month to coordinate care. 4. Only one practitioner [place] furnish and bill the service in a calendar month. 5. The patient [place] stop CCM services at any time (effective at the end of the month) by phone call to the office staff. 6. The patient will be responsible for cost sharing (co-pay) of up to 20% of the service fee (after annual deductible is met).  Patient agreed to services and verbal consent obtained.   Follow up plan:   [place]

## 2019-08-20 ENCOUNTER — Ambulatory Visit: Payer: HMO

## 2019-08-23 ENCOUNTER — Encounter: Payer: Self-pay | Admitting: Adult Health

## 2019-08-23 NOTE — Progress Notes (Addendum)
  Chronic Care Management   Outreach Note  08/20/2019 Name: Syana Bellevue Sze MRN: [id number] DOB: 03/23/1930  Referred by: [name], NP Reason for referral : No chief complaint on file.   An unsuccessful telephone outreach was attempted today. The patient was referred to the pharmacist for assistance with care management and care coordination.   Follow Up Plan:   Anson Crofts, PharmD Clinical Pharmacist [facility] at [place] [phone]

## 2019-08-24 ENCOUNTER — Other Ambulatory Visit: Payer: Self-pay

## 2019-08-24 DIAGNOSIS — I10 Essential (primary) hypertension: Secondary | ICD-10-CM

## 2019-08-24 DIAGNOSIS — I25118 Atherosclerotic heart disease of native coronary artery with other forms of angina pectoris: Secondary | ICD-10-CM

## 2019-08-24 MED ORDER — CEFDINIR 300 MG PO CAPS: 300.0000 mg | ORAL_CAPSULE | Freq: Two times a day (BID) | ORAL | 0 refills | Status: DC

## 2019-08-24 NOTE — Telephone Encounter (Signed)
Please advise 

## 2019-09-01 ENCOUNTER — Telehealth: Payer: Self-pay

## 2019-09-01 NOTE — Telephone Encounter (Signed)
@  [place] Chronic Care Management   Outreach Note  09/01/2019 Name: [name] MRN: [id number] DOB: 1929/12/01  Referred by: [name], NP Reason for referral : CCM  A second unsuccessful telephone outreach was attempted today.   Follow Up Plan:  Will attempt calling to reschedule initial CCM visit.   Anson Crofts, PharmD Clinical Pharmacist [facility] at [place] [phone]

## 2019-09-02 ENCOUNTER — Ambulatory Visit: Payer: Self-pay | Admitting: Pharmacist

## 2019-09-02 ENCOUNTER — Other Ambulatory Visit: Payer: Self-pay | Admitting: Pharmacist

## 2019-09-02 NOTE — Patient Outreach (Signed)
[facility]) Care Management  09/02/2019  Loreta Rosi Ditmer 30-Jul-1929 [id number]   Patient was called for follow up. HIPAA compliant message was left on both of her voicemails.  Patient is being followed by a [place] Pharmacist at her Provider's practice.   Plan: Close patient's pharmacy case as she is working with an [place], PharmD, [place] Clinical Pharmacist [phone]

## 2019-09-06 DIAGNOSIS — M797 Fibromyalgia: Secondary | ICD-10-CM | POA: Diagnosis not present

## 2019-09-06 DIAGNOSIS — N183 Chronic kidney disease, stage 3 unspecified: Secondary | ICD-10-CM | POA: Diagnosis not present

## 2019-09-06 DIAGNOSIS — M79673 Pain in unspecified foot: Secondary | ICD-10-CM | POA: Diagnosis not present

## 2019-09-06 DIAGNOSIS — M79643 Pain in unspecified hand: Secondary | ICD-10-CM | POA: Diagnosis not present

## 2019-09-06 DIAGNOSIS — M109 Gout, unspecified: Secondary | ICD-10-CM | POA: Diagnosis not present

## 2019-09-06 DIAGNOSIS — M1180 Other specified crystal arthropathies, unspecified site: Secondary | ICD-10-CM | POA: Diagnosis not present

## 2019-09-06 DIAGNOSIS — R768 Other specified abnormal immunological findings in serum: Secondary | ICD-10-CM | POA: Diagnosis not present

## 2019-09-06 DIAGNOSIS — M159 Polyosteoarthritis, unspecified: Secondary | ICD-10-CM | POA: Diagnosis not present

## 2019-09-06 DIAGNOSIS — I5032 Chronic diastolic (congestive) heart failure: Secondary | ICD-10-CM | POA: Diagnosis not present

## 2019-09-06 LAB — COMPREHENSIVE METABOLIC PANEL
Albumin: 4.1 (ref 3.5–5.0)
Calcium: 9.2 (ref 8.7–10.7)
Globulin: 3.3

## 2019-09-06 LAB — CBC AND DIFFERENTIAL
HCT: 40 (ref 36–46)
Hemoglobin: 12.8 (ref 12.0–16.0)
Platelets: 109 — AB (ref 150–399)
WBC: 5.1

## 2019-09-06 LAB — BASIC METABOLIC PANEL
BUN: 55 — AB (ref 4–21)
CO2: 21 (ref 13–22)
Chloride: 103 (ref 99–108)
Creatinine: 1.8 — AB (ref 0.5–1.1)
Glucose: 85
Potassium: 5 (ref 3.4–5.3)
Sodium: 141 (ref 137–147)

## 2019-09-06 LAB — HEPATIC FUNCTION PANEL
ALT: 13 (ref 7–35)
AST: 22 (ref 13–35)
Alkaline Phosphatase: 64 (ref 25–125)
Bilirubin, Total: 0.3

## 2019-09-06 LAB — CBC: RBC: 4.14 (ref 3.87–5.11)

## 2019-09-08 ENCOUNTER — Other Ambulatory Visit: Payer: Self-pay

## 2019-09-08 NOTE — Patient Outreach (Signed)
[facility]) Care Management Chronic Special Needs Program  09/08/2019  Name: [name] DOB: [birth year]  MRN: [id number]  [name] is enrolled in a chronic special needs plan for Heart Failure. Reviewed and updated care plan.  Subjective: client states she is doing a lot better since she was in the hospital for cellulitis.  States her leg has some swelling but it is a lot less.  Denies any redness in her leg and states it only is a little pink now.  States she is no longer on antibiotics.  States she weight is stable and she weighted 180 this morning.  States she is following a low sodium diet.  States she is to call and make an appointment with cardiology in March.  States she knows when to call her doctor and her daughter is good to help her contact her doctors as needed  Goals Addressed            This Visit's Progress   . Client understands the importance of follow-up with providers by attending scheduled visits   On track    Plan to keep scheduled appointments with providers Reinforced to keep scheduled appointments with providers    . Client will not report change from baseline and no repeated symptoms of stroke with in the next 6 months (continue 07/28/19)   On track    Reports no stroke symptoms Reviewed stoke symptoms and when to call 911    . Client will report no fall or injuries in the next 6 months.(continue 07/28/19)   On track    Reports one fall in October no injury Stand up slowly Use cane or walker at all times Use grabber to pick up objects on the floor Keep home well-lit and wear your glasses    . Client will report no worsening of symptoms of Atrial Fibrillation within the next 6 months(continue 07/28/19)   On track    No worsening of symptoms and taking anticoagulant as directed    . Client will report no worsening of symptoms related to heart disease within the next 6 months(continue 07/28/19)   On track    No worsening of heart disease  symptoms    . Client will report the importance of monitoring weight for Heart Failure management as evidenced by verbalizing signs of exacerbation.   On track    Signs and symptoms of heart failure reviewed Education provided for self-management of Heart Disease. Advised to notify doctor for symptoms Call 911 for severe symptoms Weigh daily and record weighs Follow a low salt diet    . Client will verbalize knowledge of chronic lung disease as evidenced by no ED visits or Inpatient stays related to chronic lung disease    On track    No ED or hospitalization for lung disease    . Client will verbalize knowledge of self management of Hypertension as evidences by BP reading of 140/90 or less; or as defined by provider   On track    Reports B/P less than 140/90 when checking  Plan to check B/P regularly Take B/P medications as ordered Plan to follow a low salt diet  Increase activity as tolerated    . Client will verbalize understanding of treatment plan for impaired skin integrity(rt leg cellulitis) and follow up with provider by next 6 months   On track    Reinforced signs and symptoms to call provider Keep legs elevated Keep follow up appointment swith provider Call provider  for increased swelling, redness or open skin    . Decrease inpatient Heart Failure admissions/ readmissions with in the year   On track    No heart failure admission    . Decrease the use of hospital emergency department related to heart failure within the next year   On track    No heart failure admission/ED visits Call [facility] [employer] [facility]) Nurse Advise Line [phone] if needed    . COMPLETED: General - Client will not be readmitted within 30 days (C-SNP)discharged 08/07/19   On track    No readmission or ED visits post 30 days discharge on 08/07/19    . Maintain timely refills of Heart Failure medication as prescribed within the year    On track    Maintaining timely refills of medications per  dispense report Reinforced importance of getting medications refilled on time    . Obtain annual  Lipid Profile, LDL-C   On track    No recent lipid profile    . Patient Stated goal to weigh 2-3 times a week and record weights for the next 6 months       Reinforced importance of weighting daily and to record weights Reviewed heart failure zones and when to call MD    . Visit Primary Care Provider or Cardiologist at least 2 times per year   On track    Primary care provider 08/13/19 Please schedule your annual wellness visit     Verbalizes good teach back of signs and symptoms to call providers  Plan:  Send successful outreach letter with a copy of their individualized care plan and Send individual care plan to provider  Chronic care management coordinator will outreach in:  6 Months as scheduled per tier level    Peter Garter RN, Jackquline Denmark, [place] Management Coordinator [place] Management [phone]

## 2019-09-15 ENCOUNTER — Encounter: Payer: Self-pay | Admitting: Family Medicine

## 2019-09-27 NOTE — Telephone Encounter (Signed)
  Chronic Care Management   Outreach Note  09/27/2019 Name: [name] MRN: [id number] DOB: 01-25-1930  Referred by: [name], NP Reason for referral : Chronic Care Management   Third unsuccessful telephone outreach was attempted today. The patient was referred to the pharmacist for assistance with care management and care coordination.   Follow Up Plan:  Left voicemail for call back.  Will attempt reaching patient to reschedule initial CCM visit.   Anson Crofts, PharmD Clinical Pharmacist [facility] at [place] [phone]

## 2019-10-05 ENCOUNTER — Encounter: Payer: Self-pay | Admitting: Adult Health

## 2019-10-05 ENCOUNTER — Ambulatory Visit (INDEPENDENT_AMBULATORY_CARE_PROVIDER_SITE_OTHER): Payer: HMO | Admitting: Adult Health

## 2019-10-05 ENCOUNTER — Other Ambulatory Visit: Payer: Self-pay

## 2019-10-05 VITALS — BP 112/68 | Temp 97.2°F | Wt 190.0 lb

## 2019-10-05 DIAGNOSIS — R34 Anuria and oliguria: Secondary | ICD-10-CM

## 2019-10-05 DIAGNOSIS — R21 Rash and other nonspecific skin eruption: Secondary | ICD-10-CM

## 2019-10-05 LAB — URINALYSIS
Bilirubin Urine: NEGATIVE
Hgb urine dipstick: NEGATIVE
Ketones, ur: NEGATIVE
Leukocytes,Ua: NEGATIVE
Nitrite: NEGATIVE
Specific Gravity, Urine: 1.02 (ref 1.000–1.030)
Total Protein, Urine: NEGATIVE
Urine Glucose: NEGATIVE
Urobilinogen, UA: 0.2 (ref 0.0–1.0)
pH: 5.5 (ref 5.0–8.0)

## 2019-10-05 LAB — BASIC METABOLIC PANEL
BUN: 58 mg/dL — ABNORMAL HIGH (ref 6–23)
CO2: 26 mEq/L (ref 19–32)
Calcium: 9.3 mg/dL (ref 8.4–10.5)
Chloride: 106 mEq/L (ref 96–112)
Creatinine, Ser: 1.53 mg/dL — ABNORMAL HIGH (ref 0.40–1.20)
GFR: 32.06 mL/min — ABNORMAL LOW (ref >60.00)
Glucose, Bld: 94 mg/dL (ref 70–99)
Potassium: 4.4 mEq/L (ref 3.5–5.1)
Sodium: 140 mEq/L (ref 135–145)

## 2019-10-05 NOTE — Progress Notes (Signed)
Subjective:    Patient ID: [name], female    DOB: 1929/10/10, 86 y.o.   MRN: [id number]  HPI 86 year old female who  has a past medical history of Adenocarcinoma, colon ([place]) (Oncologist-- Dr Truitt Merle), Allergy, Anxiety, Asthma, CAD (coronary artery disease), Cataract, Chronic anemia, Chronic diastolic CHF (congestive heart failure) ([place]), Clotting disorder ([place]), Colostomy in place [facility]), Colovaginal fistula, COPD with chronic bronchitis ([place]), Depression, Dysrhythmia, Essential hypertension, GERD (gastroesophageal reflux disease), History of adenomatous polyp of colon, History of blood transfusion, History of DVT of lower extremity, History of hiatal hernia, History of TIA (transient ischemic attack), Hyperlipidemia, OA (osteoarthritis), Osteoporosis, Peripheral neuropathy, Pneumonia, Pulmonary HTN ([place]), Renal insufficiency, Sigmoid diverticulosis, Stroke ([place]), Wears dentures, and Wears glasses.   She presents to the office today with her daughter for multiple issues.  Her first issue is that of not feeling decreased urination.  She reports that she drinks 50 to 60 ounces a day and since she was discharged from the hospital in January she has had decreased urination.  She does endorse a mild burning sensation with urination but not frequency or urgency.  He has had UTIs in the past and does not feel as though this is one.  Shee is taking Lasix 40 mg a day reports very little urination.  Additionally, over the last month or so she has had a red itchy rash throughout her torso.  This rash started soon after being placed back on allopurinol for chronic gout.  She stopped taking allopurinol about 2 weeks ago and the rash is slowly started to improve continues to itch.  At home she has been placing moisturizing lotion on the rash.  No changes in shampoos, soaps, or detergents   Review of Systems See HPI   Past Medical History:  Diagnosis Date  . Adenocarcinoma, colon [facility]) Oncologist-- Dr  Truitt Merle   Multifocal (2) Colon cancer @ ileocecal valve and ascending ( [id number]), Stage IIIB, grade I, MMR normal , 2 of 16 +lymph nodes, negative surgical margins---  06-02-2014  Right hemicolectomy w/ colostomy  . Allergy   . Anxiety   . Asthma    inhaler "sometimes"  . CAD (coronary artery disease)    a. LHC 4/09: pLAD 40, mDx 70-75, pLCx 30, mLCx 50, mRCA 90, EF 60% >> PCI: BMS x2 to RCA;  b. Myoview 8/12: normal  . Cataract    bil cateracts removed  . Chronic anemia   . Chronic diastolic CHF (congestive heart failure) (HCC)    a. Echo 912 - Mild LVH, EF 55-60%, no RWMA, Gr 1 DD, mild MR, mild LAE, mild RAE, PASP 59 mmHg (mod to severe pulmo HTN)  . Clotting disorder (HCC)    hx of dvt, tia on eliquis  . Colostomy in place [facility])    s/p colostomy takedown 5/16  . Colovaginal fistula    s/p colostomy >> colostomy takedown 5/16  . COPD with chronic bronchitis ([place])   . Depression   . Dysrhythmia    A fib  . Essential hypertension   . GERD (gastroesophageal reflux disease)   . History of adenomatous polyp of colon   . History of blood transfusion   . History of DVT of lower extremity   . History of hiatal hernia   . History of TIA (transient ischemic attack)    a. Head CT 6/15: small chronic lacunar infarct in thalamus  . Hyperlipidemia   . OA (osteoarthritis)   . Osteoporosis   .  Peripheral neuropathy   . Pneumonia   . Pulmonary HTN ([employer])    a. PASP on Echo in 9/12:  59 mmHg  . Renal insufficiency   . Sigmoid diverticulosis    s/p sigmoid colectomy  . Stroke ([place])    TIAs  . Wears dentures   . Wears glasses     Social History   Socioeconomic History  . Marital status: Widowed    Spouse name: Not on file  . Number of children: Not on file  . Years of education: Not on file  . Highest education level: Not on file  Occupational History  . Not on file  Tobacco Use  . Smoking status: Former Smoker    Packs/day: 0.50    Years: 16.00    Pack years: 8.00     Types: Cigarettes    Start date: 29    Quit date: 07/16/1967    Years since quitting: 52.2  . Smokeless tobacco: Never Used  Substance and Sexual Activity  . Alcohol use: No    Alcohol/week: 0.0 standard drinks  . Drug use: No  . Sexual activity: Not on file  Other Topics Concern  . Not on file  Social History Narrative   Married, 2 children.  As of November 2015 her husband has been living in a nursing home for tendon after years as he suffers from multiple medical problems.   Patient does not drink alcohol nor does she smoke or chew tobacco products   Right handed   8th grade   1 cup daily   Social Determinants of Health   Financial Resource Strain:   . Difficulty of Paying Living Expenses:   Food Insecurity: No Food Insecurity  . Worried About Charity fundraiser in the Last Year: Never true  . Ran Out of Food in the Last Year: Never true  Transportation Needs: No Transportation Needs  . Lack of Transportation (Medical): No  . Lack of Transportation (Non-Medical): No  Physical Activity:   . Days of Exercise per Week:   . Minutes of Exercise per Session:   Stress:   . Feeling of Stress :   Social Connections:   . Frequency of Communication with Friends and Family:   . Frequency of Social Gatherings with Friends and Family:   . Attends Religious Services:   . Active Member of Clubs or Organizations:   . Attends Archivist Meetings:   Marland Kitchen Marital Status:   Intimate Partner Violence:   . Fear of Current or Ex-Partner:   . Emotionally Abused:   Marland Kitchen Physically Abused:   . Sexually Abused:     Past Surgical History:  Procedure Laterality Date  . ABDOMINAL HYSTERECTOMY    . ANTERIOR CERVICAL DECOMP/DISCECTOMY FUSION  01-31-2009   C5 -- 7  . APPENDECTOMY    . Bone spurs Bilateral    Feet  . CARDIOVASCULAR STRESS TEST  02-26-2011   Normal lexiscan no exercise study/  no ischemia/  normal LV function and wall motion, ef 67%  . CARPAL TUNNEL RELEASE Right   .  CATARACT EXTRACTION W/ INTRAOCULAR LENS  IMPLANT, BILATERAL Bilateral   . CHOLECYSTECTOMY    . COLOSTOMY N/A 06/02/2014   Procedure: DIVERTING DESCENDING END COLOSTOMY;  Surgeon: Donnie Mesa, MD;  Location: [place];  Service: General;  Laterality: N/A;  . CORONARY ANGIOPLASTY WITH STENT PLACEMENT  11-04-2007  dr bensimhon   BMS x2 to RCA/  mild Non-obstructive disease LAD/  normal LVF  . DILATION  AND CURETTAGE OF UTERUS    . ESOPHAGOGASTRODUODENOSCOPY N/A 10/28/2017   Procedure: ESOPHAGOGASTRODUODENOSCOPY (EGD);  Surgeon: Yetta Flock, MD;  Location: [facility] ENDOSCOPY;  Service: Gastroenterology;  Laterality: N/A;  . EVALUATION UNDER ANESTHESIA WITH ANAL FISTULECTOMY N/A 10/13/2014   Procedure: ANAL EXAM UNDER ANESTHESIA ;  Surgeon: [name], MD;  Location: [facility];  Service: General;  Laterality: N/A;  . HAND SURGERY     Tendon repair  . LAPAROSCOPIC SIGMOID COLECTOMY N/A 11/24/2014   Procedure: SIGMOID COLECTOMY AND COLSOTOMY CLOSURE;  Surgeon: Donnie Mesa, MD;  Location: [place];  Service: General;  Laterality: N/A;  . LUMBAR LAMINECTOMY  10-29-2002,  1969   Left  L3 -- 4  decompression  . PARTIAL COLECTOMY N/A 06/02/2014   Procedure: RIGHT PARTIAL COLECTOMY ;  Surgeon: Donnie Mesa, MD;  Location: [place];  Service: General;  Laterality: N/A;  . PATELLECTOMY Right 09/14/2013   Procedure: PATELLECTOMY;  Surgeon: Meredith Pel, MD;  Location: [place];  Service: Orthopedics;  Laterality: Right;  . PROCTOSCOPY N/A 10/13/2014   Procedure: RIDGE PROCTOSCOPY;  Surgeon: [name], MD;  Location: [facility];  Service: General;  Laterality: N/A;  . REVISION TOTAL KNEE ARTHROPLASTY Bilateral right  04-02-2011/  left 1996 & 10-05-1999  . ROTATOR CUFF REPAIR Bilateral   . TONSILLECTOMY    . TOTAL KNEE ARTHROPLASTY Bilateral left 1994/  right 2000  . TOTAL KNEE REVISION Left 08/02/2015   Procedure: LEFT FEMORAL REVISION;  Surgeon: Gaynelle Arabian, MD;  Location: [facility];  Service: Orthopedics;   Laterality: Left;  . TRANSTHORACIC ECHOCARDIOGRAM  12-01-2009   Grade I diastolic dysfunction/  ef 60%/  moderate MR/  mild TR    Family History  Problem Relation Age of Onset  . Osteoarthritis Mother   . Heart failure Mother   . Colon cancer Maternal Uncle 40  . Heart Problems Father   . Cancer Sister 27       ? colon cancer   . Kidney cancer Brother 94  . Esophageal cancer Neg Hx   . Rectal cancer Neg Hx   . Stomach cancer Neg Hx   . Pancreatic cancer Neg Hx     No Known Allergies  Current Outpatient Medications on File Prior to Visit  Medication Sig Dispense Refill  . acetaminophen (TYLENOL) 325 MG tablet Take 650 mg by mouth every 6 (six) hours as needed for moderate pain.     Marland Kitchen albuterol (PROAIR HFA) 108 (90 Base) MCG/ACT inhaler Inhale 2 puffs into the lungs every 6 (six) hours as needed for wheezing or shortness of breath. 6.7 g 6  . apixaban (ELIQUIS) 5 MG TABS tablet Take 1 tablet (5 mg total) by mouth 2 (two) times daily. 180 tablet 0  . Ascorbic Acid (VITAMIN C) 1000 MG tablet Take 1,000 mg by mouth daily.    . benazepril (LOTENSIN) 40 MG tablet Take 1 tablet (40 mg total) by mouth daily. 90 tablet 1  . colchicine 0.6 MG tablet     . esomeprazole (NEXIUM) 40 MG capsule Take 1 capsule (40 mg total) by mouth daily. 90 capsule 1  . ferrous sulfate 325 (65 FE) MG EC tablet Take 325 mg by mouth daily with breakfast.    . fluticasone (FLOVENT HFA) 44 MCG/ACT inhaler Inhale 2 puffs into the lungs 2 (two) times daily. 1 Inhaler 0  . furosemide (LASIX) 20 MG tablet Take 2 tablets (40 mg total) by mouth daily. Please keep upcoming appt in November with Dr. Acie Fredrickson  before anymore refills. Thank you 180 tablet 3  . gabapentin (NEURONTIN) 600 MG tablet Take 1 tablet by mouth twice daily 180 tablet 0  . hydrocortisone (ANUSOL-HC) 2.5 % rectal cream Place 1 application rectally 2 (two) times daily. 30 g 1  . LORazepam (ATIVAN) 0.5 MG tablet Take 1 tablet (0.5 mg total) by mouth 2 (two)  times daily as needed for anxiety or sleep. 60 tablet 1  . metoprolol tartrate (LOPRESSOR) 50 MG tablet Take 1.5 tablets (75 mg total) by mouth 2 (two) times daily. 270 tablet 1  . nitroGLYCERIN (NITROSTAT) 0.4 MG SL tablet Place 1 tablet (0.4 mg total) under the tongue every 5 (five) minutes as needed for chest pain (x 3 pills). Reported on 09/01/2015 25 tablet 3  . sertraline (ZOLOFT) 50 MG tablet Take 1 tablet (50 mg total) by mouth daily. 90 tablet 1  . Spacer/Aero-Holding Chambers (E-Z SPACER) inhaler Use as instructed 1 each 2  . Tiotropium Bromide Monohydrate (SPIRIVA RESPIMAT) 2.5 MCG/ACT AERS Inhale 2 puffs into the lungs daily. 4 g 3  . traZODone (DESYREL) 50 MG tablet Take 1 tablet (50 mg total) by mouth at bedtime. 90 tablet 1   No current facility-administered medications on file prior to visit.    BP 112/68   Temp (!) 97.2 F (36.2 C) (Temporal)   Wt 190 lb (86.2 kg)   BMI 33.13 kg/m       Objective:   Physical Exam Vitals and nursing note reviewed.  Constitutional:      Appearance: Normal appearance.  Cardiovascular:     Rate and Rhythm: Normal rate and regular rhythm.     Pulses: Normal pulses.     Heart sounds: Normal heart sounds.  Pulmonary:     Effort: Pulmonary effort is normal.     Breath sounds: Normal breath sounds.  Musculoskeletal:        General: No swelling, tenderness, deformity or signs of injury. Normal range of motion.     Right lower leg: No edema.     Left lower leg: No edema.     Comments: Chronic swelling of right lower extremity.  She has no pitting edema bilaterally  Skin:    General: Skin is warm and dry.     Capillary Refill: Capillary refill takes less than 2 seconds.     Coloration: Skin is not jaundiced or pale.     Findings: Rash (She does have faint patches of flat red rash on chest and bilateral forearms) present. No bruising, erythema or lesion.  Neurological:     General: No focal deficit present.     Mental Status: She is  alert and oriented to person, place, and time.  Psychiatric:        Mood and Affect: Mood normal.        Behavior: Behavior normal.        Thought Content: Thought content normal.        Judgment: Judgment normal.       Assessment & Plan:  1. Decreased urination -We will check BMP and urinalysis to look for cause. - Basic Metabolic Panel - Urinalysis  2. Rash and nonspecific skin eruption -Unknown what rash is from could be allopurinol, but unlikely as she has been on this medication in the past. Advised using hydrocortisone cream twice daily as needed for symptom improvement.  [name], NP

## 2019-10-07 ENCOUNTER — Encounter: Payer: Self-pay | Admitting: Family Medicine

## 2019-10-12 ENCOUNTER — Other Ambulatory Visit: Payer: Self-pay

## 2019-10-12 ENCOUNTER — Ambulatory Visit: Payer: HMO

## 2019-10-12 DIAGNOSIS — I25118 Atherosclerotic heart disease of native coronary artery with other forms of angina pectoris: Secondary | ICD-10-CM

## 2019-10-12 DIAGNOSIS — F5101 Primary insomnia: Secondary | ICD-10-CM

## 2019-10-12 DIAGNOSIS — E7800 Pure hypercholesterolemia, unspecified: Secondary | ICD-10-CM

## 2019-10-12 DIAGNOSIS — J439 Emphysema, unspecified: Secondary | ICD-10-CM

## 2019-10-12 DIAGNOSIS — F329 Major depressive disorder, single episode, unspecified: Secondary | ICD-10-CM

## 2019-10-12 DIAGNOSIS — I10 Essential (primary) hypertension: Secondary | ICD-10-CM

## 2019-10-12 DIAGNOSIS — I5032 Chronic diastolic (congestive) heart failure: Secondary | ICD-10-CM

## 2019-10-12 DIAGNOSIS — I482 Chronic atrial fibrillation, unspecified: Secondary | ICD-10-CM

## 2019-10-12 DIAGNOSIS — F32A Depression, unspecified: Secondary | ICD-10-CM

## 2019-10-12 DIAGNOSIS — M792 Neuralgia and neuritis, unspecified: Secondary | ICD-10-CM

## 2019-10-12 DIAGNOSIS — F419 Anxiety disorder, unspecified: Secondary | ICD-10-CM

## 2019-10-12 NOTE — Chronic Care Management (AMB) (Signed)
Chronic Care Management Pharmacy  Name: [name]  MRN: [id number] DOB: Borland 17, 1931   Initial Questions: 1. Have you seen any other providers since your last visit? NA 2. Any changes in your medicines or health? No   Chief Complaint/ HPI  [name],  86 y.o. , female presents for their Initial CCM visit with the clinical pharmacist via telephone due to COVID-19 Pandemic.  PCP : [name], NP  Their chronic conditions include: acute on chronic, diastolic HF, HTN, chronic afib, HLD/ coronary atherosclerosis, COPD, GERD, osteoarthritis, CKD stage III, neuropathic pain, insomnia,  anxiety/ depression, gout, iron deficiency anemia, cellulitis   Office Visits: 03/232021- Patient presented for office visit with [name], NP for decreased urination and red itchy rash on torso. For decreased urination, BMP and urinalysis ordered. For rash, advised to use hydrocortisone cream twice daily as needed. Unlikely rash is due to allopurinol as patient took in the past.   08/13/2019- Patient presented for office visit with [name], NP for TCM visit (hospital admission due to cellulitis of right leg. She was discharged on Omnicef 300mg  twice daily for 5 days, prednisone taper, and hydrocodone for pain. Patient to continue Bryan for additional 5 days for complete resolution.   07/30/2019- Patient presented for office visit with Dr. Carolann Littler, MD for cellulitis of right leg (follow up from 07/28/2019). Patient to continue Keflex 500mg  four times daily and to continue heating pad on low heat.   07/28/2019- Patient presented for office visit with Dr. Carolann Littler, MD for cellulitis of right leg. Patient completed doxycycline with no improvement. Antibiotic changed to Keflex 500mg  four times daily x 10 days for strep coverage. Patient also prescribed hydrocodone/ APAP 5/325mg , 1 tablet every 6 hours as needed for severe pain. Patient to try heating pad on low heat several times daily.    Consult Visit: 07/30/2019- Gastroenterology- Patient presented to Dr. Scarlette Shorts, MD for internal hemorrhoids. Patient presented to ER and doctor recommended GI doctor follow up since rectal bleeding Battaglini be to hemorrhoids. Hgb: 13.3 (07/21/2019). Patient reported no further bleeding. Patient to take Metamucil 2 tablespoons daily and Anusol suppositories at night. Patient to follow up as needed.   Medications: Outpatient Encounter Medications as of 10/12/2019  Medication Sig  . acetaminophen (TYLENOL) 500 MG tablet Take 1,000 mg by mouth every 6 (six) hours as needed for moderate pain.   Marland Kitchen albuterol (PROAIR HFA) 108 (90 Base) MCG/ACT inhaler Inhale 2 puffs into the lungs every 6 (six) hours as needed for wheezing or shortness of breath.  Marland Kitchen apixaban (ELIQUIS) 5 MG TABS tablet Take 1 tablet (5 mg total) by mouth 2 (two) times daily.  . Ascorbic Acid (VITAMIN C) 1000 MG tablet Take 1,000 mg by mouth daily.  . benazepril (LOTENSIN) 40 MG tablet Take 1 tablet (40 mg total) by mouth daily.  Marland Kitchen esomeprazole (NEXIUM) 40 MG capsule Take 1 capsule (40 mg total) by mouth daily.  . ferrous sulfate 325 (65 FE) MG EC tablet Take 325 mg by mouth daily with breakfast.  . furosemide (LASIX) 20 MG tablet Take 2 tablets (40 mg total) by mouth daily. Please keep upcoming appt in November with Dr. Acie Fredrickson before anymore refills. Thank you (Patient taking differently: Take 40 mg by mouth every other day. Please keep upcoming appt in November with Dr. Acie Fredrickson before anymore refills. Thank you)  . gabapentin (NEURONTIN) 600 MG tablet Take 1 tablet by mouth twice daily  . hydrocortisone (ANUSOL-HC) 2.5 % rectal cream  Place 1 application rectally 2 (two) times daily.  Marland Kitchen LORazepam (ATIVAN) 0.5 MG tablet Take 1 tablet (0.5 mg total) by mouth 2 (two) times daily as needed for anxiety or sleep. (Patient taking differently: Take 0.5 mg by mouth at bedtime. )  . metoprolol tartrate (LOPRESSOR) 50 MG tablet Take 1.5 tablets (75 mg  total) by mouth 2 (two) times daily.  . sertraline (ZOLOFT) 50 MG tablet Take 1 tablet (50 mg total) by mouth daily.  Marland Kitchen Spacer/Aero-Holding Chambers (E-Z SPACER) inhaler Use as instructed  . Tiotropium Bromide Monohydrate (SPIRIVA RESPIMAT) 2.5 MCG/ACT AERS Inhale 2 puffs into the lungs daily.  . traZODone (DESYREL) 50 MG tablet Take 1 tablet (50 mg total) by mouth at bedtime.  . colchicine 0.6 MG tablet   . fluticasone (FLOVENT HFA) 44 MCG/ACT inhaler Inhale 2 puffs into the lungs 2 (two) times daily. (Patient not taking: Reported on 10/12/2019)  . nitroGLYCERIN (NITROSTAT) 0.4 MG SL tablet Place 1 tablet (0.4 mg total) under the tongue every 5 (five) minutes as needed for chest pain (x 3 pills). Reported on 09/01/2015 (Patient not taking: Reported on 10/12/2019)   No facility-administered encounter medications on file as of 10/12/2019.    Current Diagnosis/Assessment:  Goals Addressed            This Visit's Progress   . Pharmacy Care Plan       CARE PLAN ENTRY  Current Barriers:  . Chronic Disease Management support, education, and care coordination needs related to Atrial Fibrillation, CHF, CAD, HTN, HLD, COPD, CKD, and GERD, Osteoarthritis, CKD stage III, Neuropathic pain, Insomnia, Anxiety/ depression, Gout, Iron deficiency anemia, Cellulitis  Pharmacist Clinical Goal(s):  Marland Kitchen Work with the care management team to address educational, disease management, and care coordination needs  . Call provider office for new or worsened signs and symptoms  . Continue self health monitoring activities as directed today  . Blood pressure monitoring . Daily weights . Call care management team with questions or concerns.  . Heart failure: Check weight daily and contact physician if weight gain of more than 3 pounds overnight or more than 5 pounds in a week. . Blood pressure:  Marland Kitchen Maintain blood pressure within goal of your provider (130/80 or 140/90)  . Maintain low salt diet.  . High cholesterol/  coronary atherosclerosis:  . Cholesterol goals: Total Cholesterol goal under 200, Triglycerides goal under 150, HDL goal above 40 (men) or above 50 (women), LDL goal under 100.  Marland Kitchen Last cholesterol levels (01/19/2013) - Total cholesterol: 130 - Triglycerides: 87  - HDL: 33 - LDL: 79  . COPD: Prevent worsening of shortness of breath and hospitalizations. . Osteoarthritis/ neuropathic pain: Continue to see improvement in pain level.  . Gout: Decrease risk for gout flare-ups. Marland Kitchen GERD: Minimize reflux symptoms.  . Anxiety/ depression: Continue to see improvement in anxiety symptoms . Insomnia: Continue to see improvement in insomnia.  Interventions: . Comprehensive medication review performed. . Collaboration with provider re: medication management . Atrial fibrillation . Discussed monitoring for signs and symptoms for bleeding (coughing up blood, prolonged nose bleeds, black, tarry stools). . Continue:  - apixaban (Eliquis) 5mg  1 tablet twice daily - metoprolol tartrate 50mg , 1.5 (one and one-half) tablets twice daily  . Heart failure . We discussed weighing daily; if you gain more than 3 pounds in one day or 5 pounds in one week call your doctor . Continue:  - benazepril 40mg , 1 tablet once daily  - metoprolol tartrate 50mg , 1.5 (one  and one-half) tablets twice daily - furosemide 40mg , 1 tablet once daily or as directed by your providers . Blood pressure:  . Discussed need to continue checking blood pressure at home.  . Discussed diet modifications. DASH diet:  following a diet emphasizing fruits and vegetables and low-fat dairy products along with whole grains, fish, poultry, and nuts. Reducing red meats and sugars.  . Reducing the amount of salt intake to 1500mg /per day.  . Recommend using a salt substitute to replace your salt if you need flavor.    . Continue:  - benazepril 40mg , 1 tablet once daily - metoprolol tartrate 50mg , 1.5 (one and one-half) tablets twice daily . High  cholesterol/ coronary atherosclerosis  . How to reduce cholesterol through diet/weight management and physical activity.    . We discussed how a diet high in plant sterols (fruits/vegetables/nuts/whole grains/legumes) [place] reduce your cholesterol.  Encouraged increasing fiber to a daily intake of 10-25g/day  . Continue:  - nitroglycerin 0.4mg , 1 tablet under tongue every five minutes as needed for chest pain  . COPD . Continue:  - tiotropium (Spiriva Respimat) 2.5 mcg/ act, inhale 2 puffs into lungs daily  - albuterol (Proair HFA) 108 mcg/act inhaler, 2 puffs every six hours as needed for wheezing or shortness of breath  . Discussed to keep monitoring use of rescue inhaler (albuterol). Report to [facility] if worsening breathing or using rescue inhaler more often.  . There are different patient assistance programs we can look at if other inhalers are needed.  . Osteoarthritis . Continue: acetaminophen (Tylenol) 500mg , 2 tablets every six hours as needed for moderate pain . Gout . Continue:  colchicine 0.6mg  as needed for gout flare-ups . GERD/ heart burn . Continue: esomeprazole 40mg , 1 capsule once daily  . Iron deficiency anemia . Continue: ferrous sulfate 325mg  1 tablet once daily with breakfast . Neuropathic pain . Continue: gabapentin 600mg , 1 tablet twice daily  . Anxiety/ depression . Continue: sertraline 50mg , 1 tablet once daily  . Insomnia . Continue:   - trazodone 50mg , 1 tablet once daily at bedtime  - lorazepam 0.5mg , 1 tablet twice daily as needed for anxiety or sleep or as directed by your provider   Patient Self Care Activities:  . Self administers medications as prescribed and Calls provider office for new concerns or questions . Continue current medications as directed by providers.  . Continue following up with primary care provider and/or specialists. . Continue at home blood pressure readings.  Initial goal documentation        AFIB  Patient is currently rate  controlled. HR: 70s BPM   Patient has failed these meds in past:   Patient is currently controlled on the following medications:  - metoprolol tartrate 50mg , 1.5 (one and one-half) tablets twice daily   Anticoagulation - apixaban (Eliquis) 5mg  1 tablet twice daily  We discussed:  monitoring for signs and symptoms for bleeding (coughing up blood, prolonged nose bleeds, black, tarry stools).  Plan Continue current medications  Heart Failure  Type: Diastolic  Last ejection fraction: 55-60% (10/13/2017)   Patient has failed these meds in past: spironolactone   Patient is currently controlled on the following medications:  - benazepril 40mg , 1 tablet once daily  - metoprolol tartrate 50mg , 1.5 (one and one-half) tablets twice daily - furosemide 40mg , 1 tablet once daily (currently taking every other day; patient reported taking this way as instructed by [name])   We discussed weighing daily; if you gain more than 3 pounds  in one day or 5 pounds in one week call your doctor- patient reports not weighing every day (at most every couple of days). Patient states since not taking Lasix daily, she has gained 3 to 4 lbs or more.   Plan Patient inquired going back on taking Lasix daily.  Consulted with [name]; patient okay to go back to once daily Lasix.  Continue current medications.  ,  Hypertension   BP today is:  <130/80  Office blood pressures are  BP Readings from Last 3 Encounters:  10/05/19 112/68  08/13/19 120/80  08/07/19 116/67   Patient has failed these meds in the past: diltiazem  Patient checks BP at home daily  Patient home BP readings are ranging: 112/71 HR: 70s.   Patient is controlled on:  - benazepril 40mg , 1 tablet once daily - metoprolol tartrate 50mg , 1.5 (one and one-half) tablets twice daily  Plan Denies persistent dizziness/ lightheadedness/ orthostatic hypotension.  Continue current medications.    Hyperlipidemia/ coronary atherosclerosis  Lipid  Panel     Component Value Date/Time   CHOL 130 01/19/2013 1014   TRIG 87.0 01/19/2013 1014   HDL 33.60 (L) 01/19/2013 1014   CHOLHDL 4 01/19/2013 1014   VLDL 17.4 01/19/2013 1014   LDLCALC 79 01/19/2013 1014    The ASCVD Risk score (Goff DC Jr., et al., 2013) failed to calculate for the following reasons:   The 2013 ASCVD risk score is only valid for ages 55 to 31  Patient has failed these meds in past: simvastatin (myalgia)   Patient is currently controlled on the following medications: - nitroglycerin 0.4mg , 1 tablet under tongue every five minutes as needed for chest pain (x 3 pills) (last dose taken January 2021).   Plan Patient overdue for lipid panel. Patient does not remember trying other statins in the past.  Due to history of CAD, recommend adding statin.   COPD  Last spirometry score: none on file   Eosinophil count:   Lab Results  Component Value Date/Time   EOSPCT 4 08/06/2019 05:26 AM   EOSPCT 1.4 02/17/2017 09:32 AM  %                               Eos (Absolute):  Lab Results  Component Value Date/Time   EOSABS 0.3 08/06/2019 05:26 AM   EOSABS 0.1 02/17/2017 09:32 AM   Tobacco Status:  Social History   Tobacco Use  Smoking Status Former Smoker  . Packs/day: 0.50  . Years: 16.00  . Pack years: 8.00  . Types: Cigarettes  . Start date: 61  . Quit date: 07/16/1967  . Years since quitting: 52.2  Smokeless Tobacco Never Used   Patient has failed these meds in past: none  Flovent (per patient not using)   Patient is currently uncontrolled on the following medications:  - tiotropium (Spiriva Respimat) 2.5 mcg/ act, inhale 2 puffs into lungs daily  - albuterol (Proair HFA) 108 mcg/act inhaler, 2 puffs every six hours as needed for wheezing or shortness of breath   Using maintenance inhaler regularly? Yes Frequency of rescue inhaler use:  2 times per day    We discussed:  proper inhaler technique  - patient notes shortness of breath with activity and  at night.  - when discussing Flovent, patient stated she does not use this and only uses Spiriva and albuterol as prescribed. Discussed [place] need Flovent due to reported use of albuterol, patient  declined addition of inhalers due to cost. Discussed patient assistance [place] be done if needed.     Plan Unclear if shortness of breath due to decreased dose of Lasix or due to COPD.  Consulted with [employer] Patient to go back on once daily Lasix.  Will follow up with patient in 1 month for reassessment.   Osteoarthritis   Patient has failed these meds in past: none  Patient is currently controlled on the following medications:  - acetaminophen (Tylenol) 500mg , 2 tablets every six hours as needed for moderate pain (patient reports taking at most once to twice daily)  Plan Continue current medications  Gout   Patient has failed these meds in past: allopurinol (not taking due to rash) Patient is currently controlled on the following medications:  - colchicine 0.6mg  as needed (last flare up was in January while hospitalized  - patient reports not seeing rheumatologist anymore.   Plan Continue current medications   GERD   Patient is currently controlled on the following medications:  - esomeprazole 40mg , 1 capsule once daily   We discussed:  non-pharmacological interventions for acid reflux. Take measures to prevent acid reflux, such as avoiding spicy foods, avoiding caffeine, avoid laying down a few hours after eating, and raising the head of the bed.  Plan Continue current medications  Iron deficiency anemia   Patient is currently controlled on the following medications:  - ferrous sulfate 325mg  1 tablet once daily with breakfast   Hemoglobin & Hematocrit     Component Value Date/Time   HGB 12.8 09/06/2019 0000   HGB 11.7 (L) 07/21/2019 1031   HGB 11.7 08/04/2017 1610   HGB 11.7 02/17/2017 0932   HCT 40 09/06/2019 0000   HCT 37.4 08/04/2017 1610   HCT 39.0 02/17/2017 0932    Iron/TIBC/Ferritin/ %Sat    Component Value Date/Time   IRON 129 01/19/2018 0942   IRON 43 06/17/2016 1253   TIBC 290 01/19/2018 0942   TIBC 400 06/17/2016 1253   FERRITIN 101 01/19/2018 0942   FERRITIN 23 06/17/2016 1253   IRONPCTSAT 45 01/19/2018 0942   IRONPCTSAT 11 (L) 06/17/2016 1253   Plan Continue current medications  Neuropathic pain   Patient is currently controlled on the following medications:  - gabapentin 600mg , 1 tablet twice daily   Plan Continue current medications.    Anxiety/ depression  Patient is currently controlled on the following medications:  - sertraline 50mg , 1 tablet once daily   Plan Continue current medications  Insomnia  Patient is currently controlled on the following medications:  - trazodone 50mg , 1 tablet once daily at bedtime  - lorazepam 0.5mg , 1 tablet twice daily as needed for anxiety or sleep (patient reports taking only at bedtime)   Plan Continue current medications.   Cellulitis   Patient is currently managed on the following medications:  - finished antibiotics (previous Omnicef)  Plan Patient reports leg is still swollen and red. No improvement in symptoms.    Rash   Patient is currently managed on the following medications:  Cortisone cream  Plan Patient reported some improvement of rash with cortisone cream. "It helps the itching for a bit and then it will come back."   CKD, Stage 3   Lab Results  Component Value Date   CREATININE 1.53 (H) 10/05/2019   CREATININE 1.8 (A) 09/06/2019   CREATININE 1.23 (H) 08/06/2019   Estimated Creatinine Clearance: 27.2 mL/min (A) (by C-G formula based on SCr of 1.53 mg/dL (H)).   Plan  Kidney function improving. Continue to monitor and adjust medications as needed.    Medication Management  Patient organizes medications: patient uses pill box  Barriers: patient denies financial barriers. Patient reports she is currently paying $45 for Spiriva which has decreased from  the past and reports she can managed current copay. Patient reports not paying for Eliquis (obtains through patient assistance).    Follow up Follow up visit with PharmD in 3 months.   Consulted with [employer] Agreed to change Lasix dose changed back to once daily.  Will provide telephone check-in in 1 month for reassessment of fluid retention.    Verbal consent obtained for [facility] enhanced pharmacy services (medication synchronization, adherence packaging, delivery coordination). A medication sync plan was created to allow patient to get all medications delivered once every 30 to 90 days per patient preference. Patient understands they have freedom to choose pharmacy and clinical pharmacist will coordinate care between all prescribers and [facility].    Anson Crofts, PharmD Clinical Pharmacist [facility] [place] [phone]

## 2019-10-13 ENCOUNTER — Ambulatory Visit: Payer: Self-pay | Admitting: Pharmacist

## 2019-10-13 NOTE — Patient Instructions (Addendum)
Visit Information  Goals Addressed            This Visit's Progress   . Pharmacy Care Plan       CARE PLAN ENTRY  Current Barriers:  . Chronic Disease Management support, education, and care coordination needs related to Atrial Fibrillation, CHF, CAD, HTN, HLD, COPD, CKD, and GERD, Osteoarthritis, CKD stage III, Neuropathic pain, Insomnia, Anxiety/ depression, Gout, Iron deficiency anemia, Cellulitis  Pharmacist Clinical Goal(s):  Marland Kitchen Work with the care management team to address educational, disease management, and care coordination needs  . Call provider office for new or worsened signs and symptoms  . Continue self health monitoring activities as directed today  . Blood pressure monitoring . Daily weights . Call care management team with questions or concerns.  . Heart failure: Check weight daily and contact physician if weight gain of more than 3 pounds overnight or more than 5 pounds in a week. . Blood pressure:  Marland Kitchen Maintain blood pressure within goal of your provider (130/80 or 140/90)  . Maintain low salt diet.  . High cholesterol/ coronary atherosclerosis:  . Cholesterol goals: Total Cholesterol goal under 200, Triglycerides goal under 150, HDL goal above 40 (men) or above 50 (women), LDL goal under 100.  Marland Kitchen Last cholesterol levels (01/19/2013) - Total cholesterol: 130 - Triglycerides: 87  - HDL: 33 - LDL: 79  . COPD: Prevent worsening of shortness of breath and hospitalizations. . Osteoarthritis/ neuropathic pain: Continue to see improvement in pain level.  . Gout: Decrease risk for gout flare-ups. Marland Kitchen GERD: Minimize reflux symptoms.  . Anxiety/ depression: Continue to see improvement in anxiety symptoms . Insomnia: Continue to see improvement in insomnia.  Interventions: . Comprehensive medication review performed. . Collaboration with provider re: medication management . Atrial fibrillation . Discussed monitoring for signs and symptoms for bleeding (coughing up blood,  prolonged nose bleeds, black, tarry stools). . Continue:  - apixaban (Eliquis) 5mg  1 tablet twice daily - metoprolol tartrate 50mg , 1.5 (one and one-half) tablets twice daily  . Heart failure . We discussed weighing daily; if you gain more than 3 pounds in one day or 5 pounds in one week call your doctor . Continue:  - benazepril 40mg , 1 tablet once daily  - metoprolol tartrate 50mg , 1.5 (one and one-half) tablets twice daily - furosemide 40mg , 1 tablet once daily or as directed by your providers . Blood pressure:  . Discussed need to continue checking blood pressure at home.  . Discussed diet modifications. DASH diet:  following a diet emphasizing fruits and vegetables and low-fat dairy products along with whole grains, fish, poultry, and nuts. Reducing red meats and sugars.  . Reducing the amount of salt intake to 1500mg /per day.  . Recommend using a salt substitute to replace your salt if you need flavor.    . Continue:  - benazepril 40mg , 1 tablet once daily - metoprolol tartrate 50mg , 1.5 (one and one-half) tablets twice daily . High cholesterol/ coronary atherosclerosis  . How to reduce cholesterol through diet/weight management and physical activity.    . We discussed how a diet high in plant sterols (fruits/vegetables/nuts/whole grains/legumes) Schaab reduce your cholesterol.  Encouraged increasing fiber to a daily intake of 10-25g/day  . Continue:  - nitroglycerin 0.4mg , 1 tablet under tongue every five minutes as needed for chest pain  . COPD . Continue:  - tiotropium (Spiriva Respimat) 2.5 mcg/ act, inhale 2 puffs into lungs daily  - albuterol (Proair HFA) 108 mcg/act inhaler, 2 puffs  every six hours as needed for wheezing or shortness of breath  . Discussed to keep monitoring use of rescue inhaler (albuterol). Report to [facility] if worsening breathing or using rescue inhaler more often.  . There are different patient assistance programs we can look at if other inhalers are needed.   . Osteoarthritis . Continue: acetaminophen (Tylenol) 500mg , 2 tablets every six hours as needed for moderate pain . Gout . Continue:  colchicine 0.6mg  as needed for gout flare-ups . GERD/ heart burn . Continue: esomeprazole 40mg , 1 capsule once daily  . Iron deficiency anemia . Continue: ferrous sulfate 325mg  1 tablet once daily with breakfast . Neuropathic pain . Continue: gabapentin 600mg , 1 tablet twice daily  . Anxiety/ depression . Continue: sertraline 50mg , 1 tablet once daily  . Insomnia . Continue:   - trazodone 50mg , 1 tablet once daily at bedtime  - lorazepam 0.5mg , 1 tablet twice daily as needed for anxiety or sleep or as directed by your provider   Patient Self Care Activities:  . Self administers medications as prescribed and Calls provider office for new concerns or questions . Continue current medications as directed by providers.  . Continue following up with primary care provider and/or specialists. . Continue at home blood pressure readings.  Initial goal documentation        [name] was given information about Chronic Care Management services today including:  1. CCM service includes personalized support from designated clinical staff supervised by her physician, including individualized plan of care and coordination with other care providers 2. 24/7 contact phone numbers for assistance for urgent and routine care needs. 3. Standard insurance, coinsurance, copays and deductibles apply for chronic care management only during months in which we provide at least 20 minutes of these services. Most insurances cover these services at 100%, however patients Meir be responsible for any copay, coinsurance and/or deductible if applicable. This service Daza help you avoid the need for more expensive face-to-face services. 4. Only one practitioner Mckissic furnish and bill the service in a calendar month. 5. The patient Leder stop CCM services at any time (effective at the end of the  month) by phone call to the office staff.  Patient agreed to services and verbal consent obtained.   The patient verbalized understanding of instructions provided today and agreed to receive a mailed copy of patient instruction and/or educational materials. Telephone follow up appointment with pharmacy team member scheduled for: 01/12/2020  Verbal consent obtained for [facility] enhanced pharmacy services (medication synchronization, adherence packaging, delivery coordination). A medication sync plan was created to allow patient to get all medications delivered once every 30 to 90 days per patient preference. Patient understands they have freedom to choose pharmacy and clinical pharmacist will coordinate care between all prescribers and [facility].   Anson Crofts, PharmD Clinical Pharmacist [facility] [place] [phone]   Heart Failure, Self Care Heart failure is a serious condition. This sheet explains things you need to do to take care of yourself at home. To help you stay as healthy as possible, you Echeverry be asked to change your diet, take certain medicines, and make other changes in your life. Your doctor Stefanick also give you more specific instructions. If you have problems or questions, call your doctor. What are the risks? Having heart failure makes it more likely for you to have some problems. These problems can get worse if you do not take good care of yourself. Problems Barritt include:  Blood clotting problems. This Fuchs cause a stroke.  Damage to the kidneys, liver, or lungs.  Abnormal heart rhythms. Supplies needed:  Scale for weighing yourself.  Blood pressure monitor.  Notebook.  Medicines. How to care for yourself when you have heart failure Medicines Take over-the-counter and prescription medicines only as told by your doctor. Take your medicines every day.  Do not stop taking your medicine unless your doctor tells you to do  so.  Do not skip any medicines.  Get your prescriptions refilled before you run out of medicine. This is important. Eating and drinking   Eat heart-healthy foods. Talk with a diet specialist (dietitian) to create an eating plan.  Choose foods that: ? Have no trans fat. ? Are low in saturated fat and cholesterol.  Choose healthy foods, such as: ? Fresh or frozen fruits and vegetables. ? Fish. ? Low-fat (lean) meats. ? Legumes, such as beans, peas, and lentils. ? Fat-free or low-fat dairy products. ? Whole-grain foods. ? High-fiber foods.  Limit salt (sodium) if told by your doctor. Ask your diet specialist to tell you which seasonings are healthy for your heart.  Cook in healthy ways instead of frying. Healthy ways of cooking include roasting, grilling, broiling, baking, poaching, steaming, and stir-frying.  Limit how much fluid you drink, if told by your doctor. Alcohol use  Do not drink alcohol if: ? Your doctor tells you not to drink. ? Your heart was damaged by alcohol, or you have very bad heart failure. ? You are pregnant, Konczal be pregnant, or are planning to become pregnant.  If you drink alcohol: ? Limit how much you use to:  0-1 drink a day for women.  0-2 drinks a day for men. ? Be aware of how much alcohol is in your drink. In the [place] one drink equals one 12 oz bottle of beer (355 mL), one 5 oz glass of wine (148 mL), or one 1 oz glass of hard liquor (44 mL). Lifestyle   Do not use any products that contain nicotine or tobacco, such as cigarettes, e-cigarettes, and chewing tobacco. If you need help quitting, ask your doctor. ? Do not use nicotine gum or patches before talking to your doctor.  Do not use illegal drugs.  Lose weight if told by your doctor.  Do physical activity if told by your doctor. Talk to your doctor before you begin an exercise if: ? You are an older adult. ? You have very bad heart failure.  Learn to manage stress. If you need  help, ask your doctor.  Get rehab (rehabilitation) to help you stay independent and to help with your quality of life.  Plan time to rest when you get tired. Check weight and blood pressure   Weigh yourself every day. This will help you to know if fluid is building up in your body. ? Weigh yourself every morning after you pee (urinate) and before you eat breakfast. ? Wear the same amount of clothing each time. ? Write down your daily weight. Give your record to your doctor.  Check and write down your blood pressure as told by your doctor.  Check your pulse as told by your doctor. Dealing with very hot and very cold weather  If it is very hot: ? Avoid activities that take a lot of energy. ? Use air conditioning or fans, or find a cooler place. ? Avoid caffeine and alcohol. ? Wear clothing that is loose-fitting, lightweight, and light-colored.  If it  is very cold: ? Avoid activities that take a lot of energy. ? Layer your clothes. ? Wear mittens or gloves, a hat, and a scarf when you go outside. ? Avoid alcohol. Follow these instructions at home:  Stay up to date with shots (vaccines). Get pneumococcal and flu (influenza) shots.  Keep all follow-up visits as told by your doctor. This is important. Contact a doctor if:  You gain weight quickly.  You have increasing shortness of breath.  You cannot do your normal activities.  You get tired easily.  You cough a lot.  You don't feel like eating or feel like you Shankles vomit (nauseous).  You become puffy (swell) in your hands, feet, ankles, or belly (abdomen).  You cannot sleep well because it is hard to breathe.  You feel like your heart is beating fast (palpitations).  You get dizzy when you stand up. Get help right away if:  You have trouble breathing.  You or someone else notices a change in your behavior, such as having trouble staying awake.  You have chest pain or discomfort.  You pass out (faint). These  symptoms Schill be an emergency. Do not wait to see if the symptoms will go away. Get medical help right away. Call your local emergency services (911 in the [place] Do not drive yourself to the hospital. Summary  Heart failure is a serious condition. To care for yourself, you Fread have to change your diet, take medicines, and make other lifestyle changes.  Take your medicines every day. Do not stop taking them unless your doctor tells you to do so.  Eat heart-healthy foods, such as fresh or frozen fruits and vegetables, fish, lean meats, legumes, fat-free or low-fat dairy products, and whole-grain or high-fiber foods.  Ask your doctor if you can drink alcohol. You Sanford have to stop alcohol use if you have very bad heart failure.  Contact your doctor if you gain weight quickly or feel that your heart is beating too fast. Get help right away if you pass out, or have chest pain or trouble breathing. This information is not intended to replace advice given to you by your health care provider. Make sure you discuss any questions you have with your health care provider. Document Revised: 10/13/2018 Document Reviewed: 10/14/2018 Elsevier Patient Education  [place].

## 2019-10-14 ENCOUNTER — Ambulatory Visit: Payer: Self-pay

## 2019-10-14 NOTE — Chronic Care Management (AMB) (Signed)
   Chronic Care Management Pharmacy  Name: [name]  MRN: [id number] DOB: 09/15/1929  Verbal consent obtained for [facility] enhanced pharmacy services (medication synchronization, adherence packaging, delivery coordination). A medication sync plan was created to allow patient to get all medications delivered once every 30 to 90 days per patient preference. Patient understands they have freedom to choose pharmacy and clinical pharmacist will coordinate care between all prescribers and [facility].  Patient requested call back tomorrow to review medications and coordinate dates for delivery.

## 2019-10-15 NOTE — Chronic Care Management (AMB) (Signed)
   Chronic Care Management Pharmacy  Name: Lya Zappone Schill  MRN: [id number] DOB: 01-20-1930  Called patient to review medications and coordinate medicate delivery dates. Patient stated she has about 3 weeks left of medications and requested call back in 2 weeks to confirm counts.

## 2019-10-27 ENCOUNTER — Encounter: Payer: Self-pay | Admitting: Adult Health

## 2019-10-27 ENCOUNTER — Other Ambulatory Visit: Payer: Self-pay | Admitting: Adult Health

## 2019-10-27 MED ORDER — CEPHALEXIN 500 MG PO CAPS: 500.0000 mg | ORAL_CAPSULE | Freq: Four times a day (QID) | ORAL | 0 refills | Status: AC

## 2019-10-29 NOTE — Chronic Care Management (AMB) (Signed)
Reviewed chart for medication changes ahead of medication coordination call.  No OVs, Consults, or hospital visits since last care coordination call/Pharmacist visit.   No medication changes indicated.  BP Readings from Last 3 Encounters:  10/05/19 112/68  08/13/19 120/80  08/07/19 116/67    Lab Results  Component Value Date   HGBA1C 5.9 (H) 05/31/2014     Patient requested medications through Vials  90 Days   Last adherence delivery included: NA- patient transitioning to [facility].   Patient is due for next adherence delivery on: 11/09/2019 Called patient and reviewed medications and coordinated delivery.  This delivery to include: - benazepril 40mg , 1 tablet once daily - esomeprazole 40mg , 1 capsule once daily - gabapentin 600mg , 1 tablet twice daily - lorazepam 0.5mg , 1 tablet twice daily as needed for anxiety or sleep - metoprolol tartrate 50mg  tablet, 1.5 tablets twice daily  - sertraline 50mg , 1 tablet once daily - Spiriva 2.56mcg/ act, 2 puffs into lungs daily - Trazodone 50mg , 1 tablet once daily    Patient declined the following medications (meds) due to (reason) - albuterol HFA inhaler: uses as needed and will call when needed - colchicine 0.6mg , uses as needed and will call when needed - furosemide 20mg , (next refill due 11/25/2019) - nitroglycerin 0.4mg , uses as needed and will call when needed  Advised patient that pharmacy will contact them the morning of or the day before delivery.  Patient needs refills for: Coordinated with [name] requesting refills sent to [facility].  - benazepril 40mg , 1 tablet once daily - esomeprazole 40mg , 1 capsule once daily - gabapentin 600mg , 1 tablet twice daily - metoprolol tartrate 50mg  tablet, 1.5 tablets twice daily  - sertraline 50mg , 1 tablet once daily - Trazodone 50mg , 1 tablet once daily    Coordinated with specialists and requested refills sent to [facility].  - colchicine 0.4mg  as needed (Govinda  Aryal) - furosemide 20mg  (2 tablets once daily) (Philip [name]) - Spiriva, 2 puffs daily ([place])   Transferred prescription from [facility] - lorazepam 0.5mg  (1 tablet two times a day as needed for anxiety or sleep)    Patient stated her swelling has not improved. She noted gaining 3 to 4 lbs in 1 day. She stated some days she stays the same, but her weight has been up all the time. She states her legs stay swollen during the night.  She states her breathing is doing fine. Except, she cannot due a lot without having to take breaks. She states her last visit with cardiologist was in November 2020. She states she was told to wait and call end of April or Akram to make appt. Advised patient to contact cardiologist office sooner and request appointment.   Anson Crofts, PharmD Clinical Pharmacist [facility] [place] [phone]

## 2019-10-30 ENCOUNTER — Encounter: Payer: Self-pay | Admitting: Adult Health

## 2019-11-02 ENCOUNTER — Other Ambulatory Visit: Payer: Self-pay | Admitting: Adult Health

## 2019-11-02 ENCOUNTER — Other Ambulatory Visit: Payer: Self-pay

## 2019-11-02 DIAGNOSIS — Z760 Encounter for issue of repeat prescription: Secondary | ICD-10-CM

## 2019-11-02 MED ORDER — SERTRALINE HCL 50 MG PO TABS: 50.0000 mg | ORAL_TABLET | Freq: Every day | ORAL | 1 refills | Status: DC

## 2019-11-02 MED ORDER — METOPROLOL TARTRATE 50 MG PO TABS: 75.0000 mg | ORAL_TABLET | Freq: Two times a day (BID) | ORAL | 1 refills | Status: DC

## 2019-11-02 MED ORDER — TRAZODONE HCL 50 MG PO TABS: 50.0000 mg | ORAL_TABLET | Freq: Every day | ORAL | 1 refills | Status: DC

## 2019-11-02 MED ORDER — GABAPENTIN 600 MG PO TABS: 600.0000 mg | ORAL_TABLET | Freq: Two times a day (BID) | ORAL | 1 refills | Status: DC

## 2019-11-02 NOTE — Telephone Encounter (Signed)
FYI

## 2019-11-03 ENCOUNTER — Ambulatory Visit (INDEPENDENT_AMBULATORY_CARE_PROVIDER_SITE_OTHER): Payer: HMO | Admitting: Adult Health

## 2019-11-03 ENCOUNTER — Ambulatory Visit: Payer: HMO | Admitting: Family Medicine

## 2019-11-03 ENCOUNTER — Encounter: Payer: Self-pay | Admitting: Adult Health

## 2019-11-03 ENCOUNTER — Telehealth: Payer: Self-pay | Admitting: Pulmonary Disease

## 2019-11-03 ENCOUNTER — Other Ambulatory Visit: Payer: Self-pay | Admitting: Adult Health

## 2019-11-03 VITALS — BP 130/72 | HR 85 | Temp 98.0°F | Wt 192.6 lb

## 2019-11-03 DIAGNOSIS — Z760 Encounter for issue of repeat prescription: Secondary | ICD-10-CM

## 2019-11-03 DIAGNOSIS — I872 Venous insufficiency (chronic) (peripheral): Secondary | ICD-10-CM | POA: Diagnosis not present

## 2019-11-03 MED ORDER — ESOMEPRAZOLE MAGNESIUM 40 MG PO CPDR: 40.0000 mg | DELAYED_RELEASE_CAPSULE | Freq: Every day | ORAL | 1 refills | Status: DC

## 2019-11-03 MED ORDER — BENAZEPRIL HCL 40 MG PO TABS: 40.0000 mg | ORAL_TABLET | Freq: Every day | ORAL | 1 refills | Status: DC
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
## 2019-11-03 NOTE — Chronic Care Management (AMB) (Signed)
At last call, patient inquired on Eliquis patient assistance program.   [place] [phone]) to assess process of patient assistance program. Was told application is pending   Federal tax return (needs to have annual income adjusted) if patient files. If not, then submit social security statement with statement that she does not file taxes.   Out-of-pocket expense report or 2021 (needs to have spent at least $269.05).   Forms to be faxed to [phone].   Called patient to relay information. Left message.

## 2019-11-03 NOTE — Telephone Encounter (Signed)
Pt needs an appointment before refill can be given. I have sent a denial for refill to [facility]. Nothing further needed.

## 2019-11-03 NOTE — Progress Notes (Signed)
   Subjective:    Patient ID: [name], female    DOB: [birth year], 86 y.o.   MRN: [id number]  HPI    Review of Systems     Objective:   Physical Exam        Assessment & Plan:

## 2019-11-05 NOTE — Chronic Care Management (AMB) (Signed)
Received a message from [place] at [employer]. No prescription for Anna Genre was received and is pending delivery for 4/26. Relayed message from Dr. Cordelia Pen office: No refills will be given until patient makes an appointment.  Requested [facility] to obtain transfer from [facility] if refills are available in the meantime.   Called patient and left message for return call.

## 2019-11-10 ENCOUNTER — Encounter: Payer: Self-pay | Admitting: Adult Health

## 2019-12-09 ENCOUNTER — Encounter: Payer: Self-pay | Admitting: Cardiovascular Disease

## 2019-12-09 ENCOUNTER — Other Ambulatory Visit: Payer: Self-pay

## 2019-12-09 ENCOUNTER — Ambulatory Visit: Payer: HMO | Admitting: Cardiovascular Disease

## 2019-12-09 VITALS — BP 124/74 | HR 72 | Resp 16 | Ht 64.0 in | Wt 189.5 lb

## 2019-12-09 DIAGNOSIS — I10 Essential (primary) hypertension: Secondary | ICD-10-CM

## 2019-12-09 DIAGNOSIS — I5032 Chronic diastolic (congestive) heart failure: Secondary | ICD-10-CM | POA: Diagnosis not present

## 2019-12-09 DIAGNOSIS — I8312 Varicose veins of left lower extremity with inflammation: Secondary | ICD-10-CM | POA: Diagnosis not present

## 2019-12-09 DIAGNOSIS — L298 Other pruritus: Secondary | ICD-10-CM | POA: Diagnosis not present

## 2019-12-09 DIAGNOSIS — I8311 Varicose veins of right lower extremity with inflammation: Secondary | ICD-10-CM | POA: Diagnosis not present

## 2019-12-09 NOTE — Progress Notes (Signed)
Cardiology Office Note:    Date:  12/09/2019   ID:  [name], DOB [birth year], MRN [id number]  PCP:  [name], NP  Cardiologist:  Mertie Moores, MD  Electrophysiologist:  None  Referring MD: [name], NP   Problem list 1.  Coronary artery disease-status post stenting of her right coronary artery in 2009 2.  Chronic diastolic congestive heart failure 3.  Mitral regurgitation 4.  Hypertension 5.  Hyperlipidemia 6.  Persistent atrial fibrillation 7.  COPD 8.  Pulmonary hypertension 9.  Hx of DVT 10.  History of colon cancer  Chief Complaint  Patient presents with  . Atrial Fibrillation        [name] is a 86 y.o. female with a hx of coronary artery disease.  She is status post stenting of her right coronary artery x2 in 2009.  She has a history of chronic diastolic congestive heart failure.  She was admitted to the hospital in April, 2019 for acute on chronic congestive heart failure.  She was found to be anemic and had heme positive stools.  Has had pneumonia  Has had a cough for the past several weeks  Is more short of breath  Had a CT scan this am   Nov. 25, 2020:  No cardiac complaints Has difficulty swallowing  Food gets stuck -  Advised her to try warm water to help wash down  Breathing is ok .   Gets some shortness of breath with walking  Tries to avoid salty foods.   [name] 29, 2021: [name] is seen today for follow upu of her  atrial fib, CAD, CHF, Anemia  Has chronic dyspnea. Has some pleuretic cp. No angina    Past Medical History:  Diagnosis Date  . Adenocarcinoma, colon [facility]) Oncologist-- Dr Truitt Merle   Multifocal (2) Colon cancer @ ileocecal valve and ascending ( mT4N1Mx), Stage IIIB, grade I, MMR normal , 2 of 16 +lymph nodes, negative surgical margins---  06-02-2014  Right hemicolectomy w/ colostomy  . Allergy   . Anxiety   . Asthma    inhaler "sometimes"  . CAD (coronary artery disease)    a. LHC 4/09: pLAD 40, mDx 70-75, pLCx  30, mLCx 50, mRCA 90, EF 60% >> PCI: BMS x2 to RCA;  b. Myoview 8/12: normal  . Cataract    bil cateracts removed  . Chronic anemia   . Chronic diastolic CHF (congestive heart failure) (HCC)    a. Echo 912 - Mild LVH, EF 55-60%, no RWMA, Gr 1 DD, mild MR, mild LAE, mild RAE, PASP 59 mmHg (mod to severe pulmo HTN)  . Clotting disorder (HCC)    hx of dvt, tia on eliquis  . Colostomy in place [facility])    s/p colostomy takedown 5/16  . Colovaginal fistula    s/p colostomy >> colostomy takedown 5/16  . COPD with chronic bronchitis ([place])   . Depression   . Dysrhythmia    A fib  . Essential hypertension   . GERD (gastroesophageal reflux disease)   . History of adenomatous polyp of colon   . History of blood transfusion   . History of DVT of lower extremity   . History of hiatal hernia   . History of TIA (transient ischemic attack)    a. Head CT 6/15: small chronic lacunar infarct in thalamus  . Hyperlipidemia   . OA (osteoarthritis)   . Osteoporosis   . Peripheral neuropathy   . Pneumonia   . Pulmonary  HTN ([place])    a. PASP on Echo in 9/12:  59 mmHg  . Renal insufficiency   . Sigmoid diverticulosis    s/p sigmoid colectomy  . Stroke ([place])    TIAs  . Wears dentures   . Wears glasses     Past Surgical History:  Procedure Laterality Date  . ABDOMINAL HYSTERECTOMY    . ANTERIOR CERVICAL DECOMP/DISCECTOMY FUSION  01-31-2009   C5 -- 7  . APPENDECTOMY    . Bone spurs Bilateral    Feet  . CARDIOVASCULAR STRESS TEST  02-26-2011   Normal lexiscan no exercise study/  no ischemia/  normal LV function and wall motion, ef 67%  . CARPAL TUNNEL RELEASE Right   . CATARACT EXTRACTION W/ INTRAOCULAR LENS  IMPLANT, BILATERAL Bilateral   . CHOLECYSTECTOMY    . COLOSTOMY N/A 06/02/2014   Procedure: DIVERTING DESCENDING END COLOSTOMY;  Surgeon: Donnie Mesa, MD;  Location: [place];  Service: General;  Laterality: N/A;  . CORONARY ANGIOPLASTY WITH STENT PLACEMENT  11-04-2007  dr bensimhon   BMS  x2 to RCA/  mild Non-obstructive disease LAD/  normal LVF  . DILATION AND CURETTAGE OF UTERUS    . ESOPHAGOGASTRODUODENOSCOPY N/A 10/28/2017   Procedure: ESOPHAGOGASTRODUODENOSCOPY (EGD);  Surgeon: Yetta Flock, MD;  Location[facility] ENDOSCOPY;  Service: Gastroenterology;  Laterality: N/A;  . EVALUATION UNDER ANESTHESIA WITH ANAL FISTULECTOMY N/A 10/13/2014   Procedure: ANAL EXAM UNDER ANESTHESIA ;  Surgeon: [name], MD;  [facility];  Service: General;  Laterality: N/A;  . HAND SURGERY     Tendon repair  . LAPAROSCOPIC SIGMOID COLECTOMY N/A 11/24/2014   Procedure: SIGMOID COLECTOMY AND COLSOTOMY CLOSURE;  Surgeon: Donnie Mesa, MD;  Location: [place];  Service: General;  Laterality: N/A;  . LUMBAR LAMINECTOMY  10-29-2002,  1969   Left  L3 -- 4  decompression  . PARTIAL COLECTOMY N/A 06/02/2014   Procedure: RIGHT PARTIAL COLECTOMY ;  Surgeon: Donnie Mesa, MD;  Location: [place];  Service: General;  Laterality: N/A;  . PATELLECTOMY Right 09/14/2013   Procedure: PATELLECTOMY;  Surgeon: Meredith Pel, MD;  Location: [place];  Service: Orthopedics;  Laterality: Right;  . PROCTOSCOPY N/A 10/13/2014   Procedure: RIDGE PROCTOSCOPY;  Surgeon: [name], MD;  [facility];  Service: General;  Laterality: N/A;  . REVISION TOTAL KNEE ARTHROPLASTY Bilateral right  04-02-2011/  left 1996 & 10-05-1999  . ROTATOR CUFF REPAIR Bilateral   . TONSILLECTOMY    . TOTAL KNEE ARTHROPLASTY Bilateral left 1994/  right 2000  . TOTAL KNEE REVISION Left 08/02/2015   Procedure: LEFT FEMORAL REVISION;  Surgeon: Gaynelle Arabian, MD;  [facility];  Service: Orthopedics;  Laterality: Left;  . TRANSTHORACIC ECHOCARDIOGRAM  12-01-2009   Grade I diastolic dysfunction/  ef 60%/  moderate MR/  mild TR    Current Medications: Current Meds  Medication Sig  . acetaminophen (TYLENOL) 500 MG tablet Take 1,000 mg by mouth every 6 (six) hours as needed for moderate pain.   Marland Kitchen albuterol (PROAIR HFA) 108 (90  Base) MCG/ACT inhaler Inhale 2 puffs into the lungs every 6 (six) hours as needed for wheezing or shortness of breath.  Marland Kitchen apixaban (ELIQUIS) 5 MG TABS tablet Take 1 tablet (5 mg total) by mouth 2 (two) times daily.  . Ascorbic Acid (VITAMIN C) 1000 MG tablet Take 1,000 mg by mouth daily.  . benazepril (LOTENSIN) 40 MG tablet Take 1 tablet (40 mg total) by mouth daily.  Marland Kitchen esomeprazole ([place])  40 MG capsule Take 1 capsule (40 mg total) by mouth daily.  . ferrous sulfate 325 (65 FE) MG EC tablet Take 325 mg by mouth daily with breakfast.  . furosemide (LASIX) 20 MG tablet Take 2 tablets (40 mg total) by mouth daily. Please keep upcoming appt in November with Dr. Acie Fredrickson before anymore refills. Thank you  . gabapentin (NEURONTIN) 600 MG tablet Take 1 tablet (600 mg total) by mouth 2 (two) times daily.  . hydrocortisone (ANUSOL-HC) 2.5 % rectal cream Place 1 application rectally 2 (two) times daily. (Patient taking differently: Place 1 application rectally 2 (two) times daily as needed. )  . LORazepam (ATIVAN) 0.5 MG tablet Take 1 tablet (0.5 mg total) by mouth 2 (two) times daily as needed for anxiety or sleep. (Patient taking differently: Take 0.5 mg by mouth at bedtime. )  . metoprolol tartrate (LOPRESSOR) 50 MG tablet Take 1.5 tablets (75 mg total) by mouth 2 (two) times daily.  . nitroGLYCERIN (NITROSTAT) 0.4 MG SL tablet Place 1 tablet (0.4 mg total) under the tongue every 5 (five) minutes as needed for chest pain (x 3 pills). Reported on 09/01/2015  . sertraline (ZOLOFT) 50 MG tablet Take 1 tablet (50 mg total) by mouth daily.  Marland Kitchen Spacer/Aero-Holding Chambers (E-Z SPACER) inhaler Use as instructed  . Tiotropium Bromide Monohydrate (SPIRIVA RESPIMAT) 2.5 MCG/ACT AERS Inhale 2 puffs into the lungs daily.  . traZODone (DESYREL) 50 MG tablet Take 1 tablet (50 mg total) by mouth at bedtime.     Allergies:   Patient has no known allergies.   Social History   Socioeconomic History  . Marital status:  Widowed    Spouse name: Not on file  . Number of children: Not on file  . Years of education: Not on file  . Highest education level: Not on file  Occupational History  . Not on file  Tobacco Use  . Smoking status: Former Smoker    Packs/day: 0.50    Years: 16.00    Pack years: 8.00    Types: Cigarettes    Start date: 60    Quit date: 07/16/1967    Years since quitting: 52.4  . Smokeless tobacco: Never Used  Substance and Sexual Activity  . Alcohol use: No    Alcohol/week: 0.0 standard drinks  . Drug use: No  . Sexual activity: Not on file  Other Topics Concern  . Not on file  Social History Narrative   Married, 2 children.  As of November 2015 her husband has been living in a nursing home for tendon after years as he suffers from multiple medical problems.   Patient does not drink alcohol nor does she smoke or chew tobacco products   Right handed   8th grade   1 cup daily   Social Determinants of Health   Financial Resource Strain:   . Difficulty of Paying Living Expenses:   Food Insecurity: No Food Insecurity  . Worried About Charity fundraiser in the Last Year: Never true  . Ran Out of Food in the Last Year: Never true  Transportation Needs: No Transportation Needs  . Lack of Transportation (Medical): No  . Lack of Transportation (Non-Medical): No  Physical Activity:   . Days of Exercise per Week:   . Minutes of Exercise per Session:   Stress:   . Feeling of Stress :   Social Connections:   . Frequency of Communication with Friends and Family:   . Frequency of Social Gatherings  with Friends and Family:   . Attends Religious Services:   . Active Member of Clubs or Organizations:   . Attends Archivist Meetings:   Marland Kitchen Marital Status:      Family History: The patient's family history includes Cancer (age of onset: 47) in her sister; Colon cancer (age of onset: 38) in her maternal uncle; Heart Problems in her father; Heart failure in her mother; Kidney  cancer (age of onset: 80) in her brother; Osteoarthritis in her mother. There is no history of Esophageal cancer, Rectal cancer, Stomach cancer, or Pancreatic cancer.  ROS:   Please see the history of present illness.     All other systems reviewed and are negative.  EKGs/Labs/Other Studies Reviewed:    The following studies were reviewed today:    Recent Labs: 08/02/2019: B Natriuretic Peptide 345.2 08/03/2019: Magnesium 2.1 09/06/2019: ALT 13; Hemoglobin 12.8; Platelets 109 10/05/2019: BUN 58; Creatinine, Ser 1.53; Potassium 4.4; Sodium 140  Recent Lipid Panel    Component Value Date/Time   CHOL 130 01/19/2013 1014   TRIG 87.0 01/19/2013 1014   HDL 33.60 (L) 01/19/2013 1014   CHOLHDL 4 01/19/2013 1014   VLDL 17.4 01/19/2013 1014   LDLCALC 79 01/19/2013 1014    Physical Exam:     Physical Exam: Blood pressure 124/74, pulse 72, resp. rate 16, height 5' 4" (1.626 m), weight 189 lb 8 oz (86 kg).  GEN:  Well nourished, well developed in no acute distress HEENT: Normal NECK: No JVD; No carotid bruits LYMPHATICS: No lymphadenopathy CARDIAC: RRR , no murmurs, rubs, gallops RESPIRATORY:  Clear to auscultation without rales, wheezing or rhonchi  ABDOMEN: Soft, non-tender, non-distended MUSCULOSKELETAL:  No edema; No deformity  SKIN: Warm and dry NEUROLOGIC:  Alert and oriented x 3   EKG:    Marland Kitchen  ASSESSMENT:    1. Chronic diastolic heart failure ([place])   2. Essential hypertension    PLAN:    In order of problems listed above:  Dyspnea:    Seems to be stable    2.  Coronary artery disease:   Denies any angina   3.  Chronic diastolic congestive heart failure:  Breathing  Is stable    4.  Chronic atrial fibrillation:   No change.   Medication Adjustments/Labs and Tests Ordered: Current medicines are reviewed at length with the patient today.  Concerns regarding medicines are outlined above.  Orders Placed This Encounter  Procedures  . Basic Metabolic Panel  (BMET)   No orders of the defined types were placed in this encounter.   Patient Instructions  Medication Instructions:  Your physician recommends that you continue on your current medications as directed. Please refer to the Current Medication list given to you today.  *If you need a refill on your cardiac medications before your next appointment, please call your pharmacy*   Lab Work: TODAY - basic metabolic panel If you have labs (blood work) drawn today and your tests are completely normal, you will receive your results only by: Marland Kitchen MyChart Message (if you have MyChart) OR . A paper copy in the mail If you have any lab test that is abnormal or we need to change your treatment, we will call you to review the results.   Testing/Procedures: None Ordered   Follow-Up: At [facility], you and your health needs are our priority.  As part of our continuing mission to provide you with exceptional heart care, we have created designated Provider Care Teams.  These  Care Teams include your primary Cardiologist (physician) and Advanced Practice Providers (APPs -  Physician Assistants and Nurse Practitioners) who all work together to provide you with the care you need, when you need it.  We recommend signing up for the patient portal called "MyChart".  Sign up information is provided on this After Visit Summary.  MyChart is used to connect with patients for Virtual Visits (Telemedicine).  Patients are able to view lab/test results, encounter notes, upcoming appointments, etc.  Non-urgent messages can be sent to your provider as well.   To learn more about what you can do with MyChart, go to [email or web address].    Your next appointment:   6 month(s)  The format for your next appointment:   Either In Person or Virtual  Provider:   Richardson Dopp, PA-C or Robbie Lis, PA-C   Other Instructions   For your  leg edema you  should do  the following 1. Leg elevation - I recommend the  Lounge Dr. Leg rest.  See below for details  2. Salt restriction  -  Use potassium chloride instead of regular salt as a salt substitute. 3. Walk regularly 4. Compression hose - [facility] 5. Weight loss    Available on [email or web address] Or  Go to [email or web address]               Signed, Mertie Moores, MD  12/09/2019 5:34 PM    [place]

## 2019-12-09 NOTE — Progress Notes (Signed)
Cardiology Office Note:    Date:  12/09/2019   ID:  [name], DOB 1929/11/25, MRN [id number]  PCP:  [name], NP  Cardiologist:  Mertie Moores, MD  Electrophysiologist:  None  Referring MD: [name], NP   Problem list 1.  Coronary artery disease-status post stenting of her right coronary artery in 2009 2.  Chronic diastolic congestive heart failure 3.  Mitral regurgitation 4.  Hypertension 5.  Hyperlipidemia 6.  Persistent atrial fibrillation 7.  COPD 8.  Pulmonary hypertension 9.  Hx of DVT 10.  History of colon cancer  Chief Complaint  Patient presents with  . Atrial Fibrillation        [name] is a 86 y.o. female with a hx of coronary artery disease.  She is status post stenting of her right coronary artery x2 in 2009.  She has a history of chronic diastolic congestive heart failure.  She was admitted to the hospital in April, 2019 for acute on chronic congestive heart failure.  She was found to be anemic and had heme positive stools.  Has had pneumonia  Has had a cough for the past several weeks  Is more short of breath  Had a CT scan this am   Nov. 25, 2020:  No cardiac complaints Has difficulty swallowing  Food gets stuck -  Advised her to try warm water to help wash down  Breathing is ok .   Gets some shortness of breath with walking  Tries to avoid salty foods.   [place] 27, 2021: [name] is seen today for follow upu of her  atrial fib, CAD, CHF, Anemia     Past Medical History:  Diagnosis Date  . Adenocarcinoma, colon [facility]) Oncologist-- Dr Truitt Merle   Multifocal (2) Colon cancer @ ileocecal valve and ascending ( mT4N1Mx), Stage IIIB, grade I, MMR normal , 2 of 16 +lymph nodes, negative surgical margins---  06-02-2014  Right hemicolectomy w/ colostomy  . Allergy   . Anxiety   . Asthma    inhaler "sometimes"  . CAD (coronary artery disease)    a. LHC 4/09: pLAD 40, mDx 70-75, pLCx 30, mLCx 50, mRCA 90, EF 60% >> PCI: BMS x2 to RCA;   b. Myoview 8/12: normal  . Cataract    bil cateracts removed  . Chronic anemia   . Chronic diastolic CHF (congestive heart failure) (HCC)    a. Echo 912 - Mild LVH, EF 55-60%, no RWMA, Gr 1 DD, mild MR, mild LAE, mild RAE, PASP 59 mmHg (mod to severe pulmo HTN)  . Clotting disorder (HCC)    hx of dvt, tia on eliquis  . Colostomy in place [facility])    s/p colostomy takedown 5/16  . Colovaginal fistula    s/p colostomy >> colostomy takedown 5/16  . COPD with chronic bronchitis ([place])   . Depression   . Dysrhythmia    A fib  . Essential hypertension   . GERD (gastroesophageal reflux disease)   . History of adenomatous polyp of colon   . History of blood transfusion   . History of DVT of lower extremity   . History of hiatal hernia   . History of TIA (transient ischemic attack)    a. Head CT 6/15: small chronic lacunar infarct in thalamus  . Hyperlipidemia   . OA (osteoarthritis)   . Osteoporosis   . Peripheral neuropathy   . Pneumonia   . Pulmonary HTN ([place])    a. PASP on Echo  in 9/12:  59 mmHg  . Renal insufficiency   . Sigmoid diverticulosis    s/p sigmoid colectomy  . Stroke ([place])    TIAs  . Wears dentures   . Wears glasses     Past Surgical History:  Procedure Laterality Date  . ABDOMINAL HYSTERECTOMY    . ANTERIOR CERVICAL DECOMP/DISCECTOMY FUSION  01-31-2009   C5 -- 7  . APPENDECTOMY    . Bone spurs Bilateral    Feet  . CARDIOVASCULAR STRESS TEST  02-26-2011   Normal lexiscan no exercise study/  no ischemia/  normal LV function and wall motion, ef 67%  . CARPAL TUNNEL RELEASE Right   . CATARACT EXTRACTION W/ INTRAOCULAR LENS  IMPLANT, BILATERAL Bilateral   . CHOLECYSTECTOMY    . COLOSTOMY N/A 06/02/2014   Procedure: DIVERTING DESCENDING END COLOSTOMY;  Surgeon: Donnie Mesa, MD;  Location: [place];  Service: General;  Laterality: N/A;  . CORONARY ANGIOPLASTY WITH STENT PLACEMENT  11-04-2007  dr bensimhon   BMS x2 to RCA/  mild Non-obstructive disease LAD/  normal  LVF  . DILATION AND CURETTAGE OF UTERUS    . ESOPHAGOGASTRODUODENOSCOPY N/A 10/28/2017   Procedure: ESOPHAGOGASTRODUODENOSCOPY (EGD);  Surgeon: Yetta Flock, MD;  Location: [facility] ENDOSCOPY;  Service: Gastroenterology;  Laterality: N/A;  . EVALUATION UNDER ANESTHESIA WITH ANAL FISTULECTOMY N/A 10/13/2014   Procedure: ANAL EXAM UNDER ANESTHESIA ;  Surgeon: [name], MD;  [facility];  Service: General;  Laterality: N/A;  . HAND SURGERY     Tendon repair  . LAPAROSCOPIC SIGMOID COLECTOMY N/A 11/24/2014   Procedure: SIGMOID COLECTOMY AND COLSOTOMY CLOSURE;  Surgeon: Donnie Mesa, MD;  Location: [place];  Service: General;  Laterality: N/A;  . LUMBAR LAMINECTOMY  10-29-2002,  1969   Left  L3 -- 4  decompression  . PARTIAL COLECTOMY N/A 06/02/2014   Procedure: RIGHT PARTIAL COLECTOMY ;  Surgeon: Donnie Mesa, MD;  Location: [place];  Service: General;  Laterality: N/A;  . PATELLECTOMY Right 09/14/2013   Procedure: PATELLECTOMY;  Surgeon: Meredith Pel, MD;  Location: [place];  Service: Orthopedics;  Laterality: Right;  . PROCTOSCOPY N/A 10/13/2014   Procedure: RIDGE PROCTOSCOPY;  Surgeon: [name], MD;  [facility];  Service: General;  Laterality: N/A;  . REVISION TOTAL KNEE ARTHROPLASTY Bilateral right  04-02-2011/  left 1996 & 10-05-1999  . ROTATOR CUFF REPAIR Bilateral   . TONSILLECTOMY    . TOTAL KNEE ARTHROPLASTY Bilateral left 1994/  right 2000  . TOTAL KNEE REVISION Left 08/02/2015   Procedure: LEFT FEMORAL REVISION;  Surgeon: Gaynelle Arabian, MD;  [facility];  Service: Orthopedics;  Laterality: Left;  . TRANSTHORACIC ECHOCARDIOGRAM  12-01-2009   Grade I diastolic dysfunction/  ef 60%/  moderate MR/  mild TR    Current Medications: No outpatient medications have been marked as taking for the 12/09/19 encounter (Office Visit) with Harryette Shuart, Wonda Cheng, MD.     Allergies:   Patient has no known allergies.   Social History   Socioeconomic History  . Marital  status: Widowed    Spouse name: Not on file  . Number of children: Not on file  . Years of education: Not on file  . Highest education level: Not on file  Occupational History  . Not on file  Tobacco Use  . Smoking status: Former Smoker    Packs/day: 0.50    Years: 16.00    Pack years: 8.00    Types: Cigarettes    Start date:  70    Quit date: 07/16/1967    Years since quitting: 52.4  . Smokeless tobacco: Never Used  Substance and Sexual Activity  . Alcohol use: No    Alcohol/week: 0.0 standard drinks  . Drug use: No  . Sexual activity: Not on file  Other Topics Concern  . Not on file  Social History Narrative   Married, 2 children.  As of November 2015 her husband has been living in a nursing home for tendon after years as he suffers from multiple medical problems.   Patient does not drink alcohol nor does she smoke or chew tobacco products   Right handed   8th grade   1 cup daily   Social Determinants of Health   Financial Resource Strain:   . Difficulty of Paying Living Expenses:   Food Insecurity: No Food Insecurity  . Worried About Charity fundraiser in the Last Year: Never true  . Ran Out of Food in the Last Year: Never true  Transportation Needs: No Transportation Needs  . Lack of Transportation (Medical): No  . Lack of Transportation (Non-Medical): No  Physical Activity:   . Days of Exercise per Week:   . Minutes of Exercise per Session:   Stress:   . Feeling of Stress :   Social Connections:   . Frequency of Communication with Friends and Family:   . Frequency of Social Gatherings with Friends and Family:   . Attends Religious Services:   . Active Member of Clubs or Organizations:   . Attends Archivist Meetings:   Marland Kitchen Marital Status:      Family History: The patient's family history includes Cancer (age of onset: 54) in her sister; Colon cancer (age of onset: 39) in her maternal uncle; Heart Problems in her father; Heart failure in her mother;  Kidney cancer (age of onset: 41) in her brother; Osteoarthritis in her mother. There is no history of Esophageal cancer, Rectal cancer, Stomach cancer, or Pancreatic cancer.  ROS:   Please see the history of present illness.     All other systems reviewed and are negative.  EKGs/Labs/Other Studies Reviewed:    The following studies were reviewed today:    Recent Labs: 08/02/2019: B Natriuretic Peptide 345.2 08/03/2019: Magnesium 2.1 09/06/2019: ALT 13; Hemoglobin 12.8; Platelets 109 10/05/2019: BUN 58; Creatinine, Ser 1.53; Potassium 4.4; Sodium 140  Recent Lipid Panel    Component Value Date/Time   CHOL 130 01/19/2013 1014   TRIG 87.0 01/19/2013 1014   HDL 33.60 (L) 01/19/2013 1014   CHOLHDL 4 01/19/2013 1014   VLDL 17.4 01/19/2013 1014   LDLCALC 79 01/19/2013 1014    Physical Exam:     Physical Exam: There were no vitals taken for this visit.  GEN:  Well nourished, well developed in no acute distress HEENT: Normal NECK: No JVD; No carotid bruits LYMPHATICS: No lymphadenopathy CARDIAC: RRR , no murmurs, rubs, gallops RESPIRATORY:  Clear to auscultation without rales, wheezing or rhonchi  ABDOMEN: Soft, non-tender, non-distended MUSCULOSKELETAL:  No edema; No deformity  SKIN: Warm and dry NEUROLOGIC:  Alert and oriented x 3   EKG:    Marland Kitchen  ASSESSMENT:    No diagnosis found. PLAN:    In order of problems listed above:  Dyspnea:      2.  Coronary artery disease: no angina .      3.  Chronic diastolic congestive heart failure:    4.  Chronic atrial fibrillation:    Medication Adjustments/Labs  and Tests Ordered: Current medicines are reviewed at length with the patient today.  Concerns regarding medicines are outlined above.  No orders of the defined types were placed in this encounter.  No orders of the defined types were placed in this encounter.   There are no Patient Instructions on file for this visit.   Signed, Mertie Moores, MD  12/09/2019 6:31  AM    [place]

## 2019-12-09 NOTE — Patient Instructions (Addendum)
Medication Instructions:  Your physician recommends that you continue on your current medications as directed. Please refer to the Current Medication list given to you today.  *If you need a refill on your cardiac medications before your next appointment, please call your pharmacy*   Lab Work: TODAY - basic metabolic panel If you have labs (blood work) drawn today and your tests are completely normal, you will receive your results only by: Marland Kitchen MyChart Message (if you have MyChart) OR . A paper copy in the mail If you have any lab test that is abnormal or we need to change your treatment, we will call you to review the results.   Testing/Procedures: None Ordered   Follow-Up: At [facility], you and your health needs are our priority.  As part of our continuing mission to provide you with exceptional heart care, we have created designated Provider Care Teams.  These Care Teams include your primary Cardiologist (physician) and Advanced Practice Providers (APPs -  Physician Assistants and Nurse Practitioners) who all work together to provide you with the care you need, when you need it.  We recommend signing up for the patient portal called "MyChart".  Sign up information is provided on this After Visit Summary.  MyChart is used to connect with patients for Virtual Visits (Telemedicine).  Patients are able to view lab/test results, encounter notes, upcoming appointments, etc.  Non-urgent messages can be sent to your provider as well.   To learn more about what you can do with MyChart, go to [email or web address].    Your next appointment:   6 month(s)  The format for your next appointment:   Either In Person or Virtual  Provider:   Richardson Dopp, PA-C or Robbie Lis, PA-C   Other Instructions   For your  leg edema you  should do  the following 1. Leg elevation - I recommend the Lounge Dr. Leg rest.  See below for details  2. Salt restriction  -  Use potassium chloride instead of  regular salt as a salt substitute. 3. Walk regularly 4. Compression hose - [facility] 5. Weight loss    Available on [email or web address] Or  Go to [email or web address]

## 2019-12-10 LAB — BASIC METABOLIC PANEL
BUN/Creatinine Ratio: 25 (ref 12–28)
BUN: 34 mg/dL — ABNORMAL HIGH (ref 8–27)
CO2: 21 mmol/L (ref 20–29)
Calcium: 9.3 mg/dL (ref 8.7–10.3)
Chloride: 106 mmol/L (ref 96–106)
Creatinine, Ser: 1.38 mg/dL — ABNORMAL HIGH (ref 0.57–1.00)
GFR calc Af Amer: 40 mL/min/{1.73_m2} — ABNORMAL LOW (ref >59)
GFR calc non Af Amer: 34 mL/min/{1.73_m2} — ABNORMAL LOW (ref >59)
Glucose: 93 mg/dL (ref 65–99)
Potassium: 4.5 mmol/L (ref 3.5–5.2)
Sodium: 141 mmol/L (ref 134–144)

## 2019-12-14 ENCOUNTER — Other Ambulatory Visit: Payer: Self-pay | Admitting: Nurse Practitioner

## 2019-12-14 MED ORDER — FUROSEMIDE 20 MG PO TABS: 40.0000 mg | ORAL_TABLET | Freq: Every day | ORAL | 3 refills | Status: DC

## 2019-12-24 ENCOUNTER — Other Ambulatory Visit: Payer: Self-pay | Admitting: *Deleted

## 2019-12-24 DIAGNOSIS — M25569 Pain in unspecified knee: Secondary | ICD-10-CM

## 2019-12-24 NOTE — Progress Notes (Signed)
Opened in error

## 2019-12-27 ENCOUNTER — Encounter: Payer: Self-pay | Admitting: Surgery

## 2019-12-27 ENCOUNTER — Ambulatory Visit (HOSPITAL_COMMUNITY)
Admission: RE | Admit: 2019-12-27 | Discharge: 2019-12-27 | Disposition: A | Discharge status: Home / Self Care | Payer: HMO | Source: Ambulatory Visit | Attending: Surgery | Admitting: Surgery

## 2019-12-27 ENCOUNTER — Other Ambulatory Visit: Payer: Self-pay

## 2019-12-27 ENCOUNTER — Ambulatory Visit (INDEPENDENT_AMBULATORY_CARE_PROVIDER_SITE_OTHER): Payer: HMO | Admitting: Surgery

## 2019-12-27 VITALS — BP 139/83 | HR 76 | Temp 97.9°F | Resp 20 | Ht 64.0 in | Wt 185.0 lb

## 2019-12-27 DIAGNOSIS — I872 Venous insufficiency (chronic) (peripheral): Secondary | ICD-10-CM | POA: Diagnosis not present

## 2019-12-27 DIAGNOSIS — M25569 Pain in unspecified knee: Secondary | ICD-10-CM

## 2019-12-27 NOTE — Progress Notes (Signed)
Vascular and Vein Specialist [facility]  Patient name: [name] MRN: [id number] DOB: 10-27-1929 Sex: female   REQUESTING PROVIDER:    [name]   REASON FOR CONSULT:    Venous stasis  HISTORY OF PRESENT ILLNESS:   [name] is a 86 y.o. female, who is referred for evaluation of chronic venous insufficiency.  The patient states that for about 6 months he has been having issues with swelling in both of her legs.  She has tried compression from [facility] without significant benefit.  Patient has a history of colon cancer.  She suffers from coronary artery disease status post PCI.  She has a history of DVT.  She is on Eliquis.  She is a former smoker.  She is medically managed for hypertension.  PAST MEDICAL HISTORY    Past Medical History:  Diagnosis Date  . Adenocarcinoma, colon [facility]) Oncologist-- Dr Truitt Merle   Multifocal (2) Colon cancer @ ileocecal valve and ascending ( [id number]), Stage IIIB, grade I, MMR normal , 2 of 16 +lymph nodes, negative surgical margins---  06-02-2014  Right hemicolectomy w/ colostomy  . Allergy   . Anxiety   . Asthma    inhaler "sometimes"  . CAD (coronary artery disease)    a. LHC 4/09: pLAD 40, mDx 70-75, pLCx 30, mLCx 50, mRCA 90, EF 60% >> PCI: BMS x2 to RCA;  b. Myoview 8/12: normal  . Cataract    bil cateracts removed  . Chronic anemia   . Chronic diastolic CHF (congestive heart failure) (HCC)    a. Echo 912 - Mild LVH, EF 55-60%, no RWMA, Gr 1 DD, mild MR, mild LAE, mild RAE, PASP 59 mmHg (mod to severe pulmo HTN)  . Clotting disorder (HCC)    hx of dvt, tia on eliquis  . Colostomy in place [facility])    s/p colostomy takedown 5/16  . Colovaginal fistula    s/p colostomy >> colostomy takedown 5/16  . COPD with chronic bronchitis ([place])   . Depression   . Dysrhythmia    A fib  . Essential hypertension   . GERD (gastroesophageal reflux disease)   . History of adenomatous polyp of colon   . History  of blood transfusion   . History of DVT of lower extremity   . History of hiatal hernia   . History of TIA (transient ischemic attack)    a. Head CT 6/15: small chronic lacunar infarct in thalamus  . Hyperlipidemia   . OA (osteoarthritis)   . Osteoporosis   . Peripheral neuropathy   . Pneumonia   . Pulmonary HTN ([place])    a. PASP on Echo in 9/12:  59 mmHg  . Renal insufficiency   . Sigmoid diverticulosis    s/p sigmoid colectomy  . Stroke ([place])    TIAs  . Wears dentures   . Wears glasses      FAMILY HISTORY   Family History  Problem Relation Age of Onset  . Osteoarthritis Mother   . Heart failure Mother   . Colon cancer Maternal Uncle 74  . Heart Problems Father   . Cancer Sister 60       ? colon cancer   . Kidney cancer Brother 82  . Esophageal cancer Neg Hx   . Rectal cancer Neg Hx   . Stomach cancer Neg Hx   . Pancreatic cancer Neg Hx     SOCIAL HISTORY:   Social History   Socioeconomic History  . Marital  status: Widowed    Spouse name: Not on file  . Number of children: Not on file  . Years of education: Not on file  . Highest education level: Not on file  Occupational History  . Not on file  Tobacco Use  . Smoking status: Former Smoker    Packs/day: 0.50    Years: 16.00    Pack years: 8.00    Types: Cigarettes    Start date: 74    Quit date: 07/16/1967    Years since quitting: 52.4  . Smokeless tobacco: Never Used  Vaping Use  . Vaping Use: Never used  Substance and Sexual Activity  . Alcohol use: No    Alcohol/week: 0.0 standard drinks  . Drug use: No  . Sexual activity: Not on file  Other Topics Concern  . Not on file  Social History Narrative   Married, 2 children.  As of November 2015 her husband has been living in a nursing home for tendon after years as he suffers from multiple medical problems.   Patient does not drink alcohol nor does she smoke or chew tobacco products   Right handed   8th grade   1 cup daily   Social  Determinants of Health   Financial Resource Strain:   . Difficulty of Paying Living Expenses:   Food Insecurity: No Food Insecurity  . Worried About Charity fundraiser in the Last Year: Never true  . Ran Out of Food in the Last Year: Never true  Transportation Needs: No Transportation Needs  . Lack of Transportation (Medical): No  . Lack of Transportation (Non-Medical): No  Physical Activity:   . Days of Exercise per Week:   . Minutes of Exercise per Session:   Stress:   . Feeling of Stress :   Social Connections:   . Frequency of Communication with Friends and Family:   . Frequency of Social Gatherings with Friends and Family:   . Attends Religious Services:   . Active Member of Clubs or Organizations:   . Attends Archivist Meetings:   Marland Kitchen Marital Status:   Intimate Partner Violence:   . Fear of Current or Ex-Partner:   . Emotionally Abused:   Marland Kitchen Physically Abused:   . Sexually Abused:     ALLERGIES:    No Known Allergies  CURRENT MEDICATIONS:    Current Outpatient Medications  Medication Sig Dispense Refill  . acetaminophen (TYLENOL) 500 MG tablet Take 1,000 mg by mouth every 6 (six) hours as needed for moderate pain.     Marland Kitchen albuterol (PROAIR HFA) 108 (90 Base) MCG/ACT inhaler Inhale 2 puffs into the lungs every 6 (six) hours as needed for wheezing or shortness of breath. 6.7 g 6  . apixaban (ELIQUIS) 5 MG TABS tablet Take 1 tablet (5 mg total) by mouth 2 (two) times daily. 180 tablet 0  . Ascorbic Acid (VITAMIN C) 1000 MG tablet Take 1,000 mg by mouth daily.    . benazepril (LOTENSIN) 40 MG tablet Take 1 tablet (40 mg total) by mouth daily. 90 tablet 1  . esomeprazole (NEXIUM) 40 MG capsule Take 1 capsule (40 mg total) by mouth daily. 90 capsule 1  . ferrous sulfate 325 (65 FE) MG EC tablet Take 325 mg by mouth daily with breakfast.    . furosemide (LASIX) 20 MG tablet Take 2 tablets (40 mg total) by mouth daily. 180 tablet 3  . gabapentin (NEURONTIN) 600 MG  tablet Take 1 tablet (600 mg total)  by mouth 2 (two) times daily. 180 tablet 1  . hydrocortisone (ANUSOL-HC) 2.5 % rectal cream Place 1 application rectally 2 (two) times daily. (Patient taking differently: Place 1 application rectally 2 (two) times daily as needed. ) 30 g 1  . LORazepam (ATIVAN) 0.5 MG tablet Take 1 tablet (0.5 mg total) by mouth 2 (two) times daily as needed for anxiety or sleep. (Patient taking differently: Take 0.5 mg by mouth at bedtime. ) 60 tablet 1  . metoprolol tartrate (LOPRESSOR) 50 MG tablet Take 1.5 tablets (75 mg total) by mouth 2 (two) times daily. 270 tablet 1  . nitroGLYCERIN (NITROSTAT) 0.4 MG SL tablet Place 1 tablet (0.4 mg total) under the tongue every 5 (five) minutes as needed for chest pain (x 3 pills). Reported on 09/01/2015 25 tablet 3  . sertraline (ZOLOFT) 50 MG tablet Take 1 tablet (50 mg total) by mouth daily. 90 tablet 1  . Spacer/Aero-Holding Chambers (E-Z SPACER) inhaler Use as instructed 1 each 2  . Tiotropium Bromide Monohydrate (SPIRIVA RESPIMAT) 2.5 MCG/ACT AERS Inhale 2 puffs into the lungs daily. 4 g 3  . traZODone (DESYREL) 50 MG tablet Take 1 tablet (50 mg total) by mouth at bedtime. 90 tablet 1  . triamcinolone cream (KENALOG) 0.1 % Apply 1 application topically 2 (two) times daily.     No current facility-administered medications for this visit.    REVIEW OF SYSTEMS:   _0  denotes positive finding, _1  denotes negative finding Cardiac  Comments:  Chest pain or chest pressure:    Shortness of breath upon exertion:    Short of breath when lying flat:    Irregular heart rhythm:        Vascular    Pain in calf, thigh, or hip brought on by ambulation:    Pain in feet at night that wakes you up from your sleep:     Blood clot in your veins:    Leg swelling:  x       Pulmonary    Oxygen at home:    Productive cough:     Wheezing:         Neurologic    Sudden weakness in arms or legs:     Sudden numbness in arms or legs:      Sudden onset of difficulty speaking or slurred speech:    Temporary loss of vision in one eye:     Problems with dizziness:         Gastrointestinal    Blood in stool:      Vomited blood:         Genitourinary    Burning when urinating:     Blood in urine:        Psychiatric    Major depression:         Hematologic    Bleeding problems:    Problems with blood clotting too easily:        Skin    Rashes or ulcers:        Constitutional    Fever or chills:     PHYSICAL EXAM:   Vitals:   12/27/19 1207  BP: 139/83  Pulse: 76  Resp: 20  Temp: 97.9 F (36.6 C)  SpO2: 97%  Weight: 185 lb (83.9 kg)  Height: _2  (1.626 m)    GENERAL: The patient is a well-nourished female, in no acute distress. The vital signs are documented above. CARDIAC: There is a regular rate and rhythm.  VASCULAR:  Palpable left dorsalis pedis pulse.  I could not palpate the right.  1+ pitting edema bilaterally PULMONARY: Nonlabored respirations ABDOMEN: Soft and non-tender with normal pitched bowel sounds.  MUSCULOSKELETAL: There are no major deformities or cyanosis. NEUROLOGIC: No focal weakness or paresthesias are detected. SKIN: Small superficial abrasions all measuring less than 2 mm PSYCHIATRIC: The patient has a normal affect.  STUDIES:   I have reviewed the following Venous Reflux Times  +--------------+---------+------+-----------+------------+--------+  RIGHT     Reflux NoRefluxReflux TimeDiameter cmsComments               Yes                   +--------------+---------+------+-----------+------------+--------+  CFV            yes  >1 second             +--------------+---------+------+-----------+------------+--------+  FV mid    no                         +--------------+---------+------+-----------+------------+--------+  Popliteal   no                          +--------------+---------+------+-----------+------------+--------+  GSV at SFJ        yes  >500 ms   0.564        +--------------+---------+------+-----------+------------+--------+  GSV prox thigh      yes  >500 ms   0.328        +--------------+---------+------+-----------+------------+--------+  GSV mid thigh       yes  >500 ms   0.342        +--------------+---------+------+-----------+------------+--------+  GSV dist thighno               0.308        +--------------+---------+------+-----------+------------+--------+  GSV at knee  no               0.301        +--------------+---------+------+-----------+------------+--------+  GSV prox calf       yes  >500 ms   0.219        +--------------+---------+------+-----------+------------+--------+  SSV Pop Fossa no               0.292        +--------------+---------+------+-----------+------------+--------+  SSV prox calf no               0.269        +--------------+---------+------+-----------+------------+--------+  SSV mid calf no               0.223        +--------------+---------+------+-----------+------------+--------+     +--------------+---------+------+-----------+------------+--------+  LEFT     Reflux NoRefluxReflux TimeDiameter cmsComments               Yes                   +--------------+---------+------+-----------+------------+--------+  CFV            yes  >1 second             +--------------+---------+------+-----------+------------+--------+  FV mid          yes  >1 second             +--------------+---------+------+-----------+------------+--------+  Popliteal         yes  >1  second             +--------------+---------+------+-----------+------------+--------+  GSV at [facility]  no  0.396        +--------------+---------+------+-----------+------------+--------+  GSV prox thigh      yes  >500 ms   0.396        +--------------+---------+------+-----------+------------+--------+  GSV mid thigh       yes  >500 ms   0.369        +--------------+---------+------+-----------+------------+--------+  GSV dist thigh      yes  >500 ms   0.29        +--------------+---------+------+-----------+------------+--------+  GSV at knee        yes  >500 ms   0.297        +--------------+---------+------+-----------+------------+--------+  GSV prox calf       yes  >500 ms   0.262        +--------------+---------+------+-----------+------------+--------+  SSV Pop Fossa no               0.37        +--------------+---------+------+-----------+------------+--------+  SSV prox calf       yes  >500 ms   0.377        +--------------+---------+------+-----------+------------+--------+  SSV mid calf       yes  >500 ms   0.311        +--------------+---------+------+-----------+------------+--------+         Summary:  Right:  - No evidence of deep vein thrombosis seen in the right lower extremity,  from the common femoral through the popliteal veins.  - No evidence of superficial venous thrombosis in the right lower  extremity.  - Deep vein reflux in the CFV.  - Superficial vein reflux in the SFJ and GSV as above.    Left:  - No evidence of deep vein thrombosis seen in the left lower extremity,  from the common femoral through the popliteal veins.  - No evidence of superficial venous thrombosis in the left lower  extremity.  - Deep vein reflux in the CFV,  FV, and popliteal vein.  - Superficial vein reflux in the SSV and GSV as above.  ASSESSMENT and PLAN   Chronic venous insufficiency: I think that her swelling is due to venous insufficiency.  This was confirmed with her reflux evaluation today.  She is not a candidate for laser venous ablation due to the small size of her saphenous veins.  I discussed the importance of leg elevation and wearing the appropriate compression stockings (20-30).  I told him how to obtain the appropriate compression and how to measure the leg.  All of their questions were answered.  She knows to contact me if she develops a true ulcer.  Otherwise, I will see her back as needed.   Leia Alf, MD, FACS [facility] Specialists of [facility] [phone] Pager [phone]

## 2020-01-12 ENCOUNTER — Telehealth: Payer: HMO

## 2020-01-18 ENCOUNTER — Telehealth: Payer: Self-pay

## 2020-01-18 ENCOUNTER — Other Ambulatory Visit: Payer: Self-pay

## 2020-01-18 ENCOUNTER — Ambulatory Visit: Payer: HMO

## 2020-01-18 DIAGNOSIS — E7800 Pure hypercholesterolemia, unspecified: Secondary | ICD-10-CM

## 2020-01-18 DIAGNOSIS — I482 Chronic atrial fibrillation, unspecified: Secondary | ICD-10-CM

## 2020-01-18 DIAGNOSIS — I5032 Chronic diastolic (congestive) heart failure: Secondary | ICD-10-CM

## 2020-01-18 DIAGNOSIS — I10 Essential (primary) hypertension: Secondary | ICD-10-CM

## 2020-01-18 DIAGNOSIS — J439 Emphysema, unspecified: Secondary | ICD-10-CM

## 2020-01-18 NOTE — Telephone Encounter (Signed)
  Chronic Care Management   Outreach Note  01/18/2020 Name: [name] MRN: [id number] DOB: 1929-07-30  Referred by: [name], NP Reason for referral : Chronic Care Management   An unsuccessful telephone outreach was attempted today. The patient was referred to the pharmacist for assistance with care management and care coordination.   Follow Up Plan:  Left message for patient to return call. Will attempt to reach patient again if do not hear back.   Anson Crofts, PharmD Clinical Pharmacist [facility] [place] [phone]

## 2020-01-18 NOTE — Chronic Care Management (AMB) (Signed)
Chronic Care Management Pharmacy  Name: [name]  MRN: [id number] DOB: 1929-09-17   Initial Questions: 1. Have you seen any other providers since your last visit? Yes  2. Any changes in your medicines or health? No   Chief Complaint/ HPI  [name],  86 y.o. , female presents for their Follow-Up CCM visit with the clinical pharmacist via telephone due to COVID-19 Pandemic.  Reports not feeling good due to hot weather.  Has to wear support hose all the time and reports it is helping the swelling. Patient reports swelling has improved and notes sleeping with feet elevated. Reports not eating salt.   PCP : [name], NP  Their chronic conditions include: diastolic HF, HTN, chronic afib, HLD/ coronary atherosclerosis, COPD, GERD, osteoarthritis, CKD stage III, neuropathic pain, insomnia,  anxiety/ depression, gout, iron deficiency anemia, cellulitis   Office Visits: 11/03/2019- [name], NP- Patient presented for office visit for concern of cellulitis of right leg. Appears venous stasis or venous stasis dermatitis. Patient referred to vascular surgery for further evaluation.   03/232021- Patient presented for office visit with [name], NP for decreased urination and red itchy rash on torso. For decreased urination, BMP and urinalysis ordered. For rash, advised to use hydrocortisone cream twice daily as needed. Unlikely rash is due to allopurinol as patient took in the past.   08/13/2019- Patient presented for office visit with [name], NP for TCM visit (hospital admission due to cellulitis of right leg. She was discharged on Omnicef 300mg  twice daily for 5 days, prednisone taper, and hydrocodone for pain. Patient to continue [place] for additional 5 days for complete resolution.   07/30/2019- Patient presented for office visit with Dr. Carolann Littler, MD for cellulitis of right leg (follow up from 07/28/2019). Patient to continue Keflex 500mg  four times daily and to  continue heating pad on low heat.   07/28/2019- Patient presented for office visit with Dr. Carolann Littler, MD for cellulitis of right leg. Patient completed doxycycline with no improvement. Antibiotic changed to Keflex 500mg  four times daily x 10 days for strep coverage. Patient also prescribed hydrocodone/ APAP 5/325mg , 1 tablet every 6 hours as needed for severe pain. Patient to try heating pad on low heat several times daily.   Consult Visit: 12/27/2019- Vascular surgery- Harold Barban, MD- Patient presented for office visit for venous stasis evaluation. Patient not a candidate for laser venous ablation. Patient to continue leg elevation and wear compression stockings (20-30).  12/09/2019- Cardiology- Mertie Moores, MD- Patient presented for office visit for follow up for afib, CAD, CHF, anemia.  Patient presented stable. Patient to obtain BMET. Patient advised leg elevation, salt restrition, compression hose, walk regularly.   07/30/2019- Gastroenterology- Patient presented to Dr. Scarlette Shorts, MD for internal hemorrhoids. Patient presented to ER and doctor recommended GI doctor follow up since rectal bleeding Boulos be to hemorrhoids. Hgb: 13.3 (07/21/2019). Patient reported no further bleeding. Patient to take Metamucil 2 tablespoons daily and Anusol suppositories at night. Patient to follow up as needed.   Medications: Outpatient Encounter Medications as of 01/18/2020  Medication Sig  . acetaminophen (TYLENOL) 500 MG tablet Take 1,000 mg by mouth every 6 (six) hours as needed for moderate pain.   Marland Kitchen albuterol (PROAIR HFA) 108 (90 Base) MCG/ACT inhaler Inhale 2 puffs into the lungs every 6 (six) hours as needed for wheezing or shortness of breath.  Marland Kitchen apixaban (ELIQUIS) 5 MG TABS tablet Take 1 tablet (5 mg total) by mouth 2 (two)  times daily.  . benazepril (LOTENSIN) 40 MG tablet Take 1 tablet (40 mg total) by mouth daily.  . furosemide (LASIX) 20 MG tablet Take 2 tablets (40 mg total) by mouth daily.  Marland Kitchen  gabapentin (NEURONTIN) 600 MG tablet Take 1 tablet (600 mg total) by mouth 2 (two) times daily.  Marland Kitchen LORazepam (ATIVAN) 0.5 MG tablet Take 1 tablet (0.5 mg total) by mouth 2 (two) times daily as needed for anxiety or sleep. (Patient taking differently: Take 0.5 mg by mouth at bedtime. )  . metoprolol tartrate (LOPRESSOR) 50 MG tablet Take 1.5 tablets (75 mg total) by mouth 2 (two) times daily.  . sertraline (ZOLOFT) 50 MG tablet Take 1 tablet (50 mg total) by mouth daily.  Marland Kitchen Spacer/Aero-Holding Chambers (E-Z SPACER) inhaler Use as instructed  . Tiotropium Bromide Monohydrate (SPIRIVA RESPIMAT) 2.5 MCG/ACT AERS Inhale 2 puffs into the lungs daily.  . traZODone (DESYREL) 50 MG tablet Take 1 tablet (50 mg total) by mouth at bedtime.  . Ascorbic Acid (VITAMIN C) 1000 MG tablet Take 1,000 mg by mouth daily.  Marland Kitchen esomeprazole (NEXIUM) 40 MG capsule Take 1 capsule (40 mg total) by mouth daily.  . ferrous sulfate 325 (65 FE) MG EC tablet Take 325 mg by mouth daily with breakfast.  . hydrocortisone (ANUSOL-HC) 2.5 % rectal cream Place 1 application rectally 2 (two) times daily. (Patient taking differently: Place 1 application rectally 2 (two) times daily as needed. )  . nitroGLYCERIN (NITROSTAT) 0.4 MG SL tablet Place 1 tablet (0.4 mg total) under the tongue every 5 (five) minutes as needed for chest pain (x 3 pills). Reported on 09/01/2015 (Patient not taking: Reported on 01/24/2020)  . triamcinolone cream (KENALOG) 0.1 % Apply 1 application topically 2 (two) times daily. (Patient not taking: Reported on 01/24/2020)  . [DISCONTINUED] Tiotropium Bromide Monohydrate (SPIRIVA RESPIMAT) 2.5 MCG/ACT AERS Inhale 2 puffs into the lungs daily.   No facility-administered encounter medications on file as of 01/18/2020.    Current Diagnosis/Assessment:  Goals Addressed            This Visit's Progress   . Pharmacy Care Plan       CARE PLAN ENTRY (see longitudinal plan of care for additional care plan  information)  Current Barriers:  . Chronic Disease Management support, education, and care coordination needs related to Hypertension, Hyperlipidemia, Atrial Fibrillation, Heart Failure, and Pulmonary emphysema    Atrial fibrillation (Afib) . Pharmacist Clinical Goal(s) o Over the next 60 days, patient will work with PharmD and providers to maintain normal sinus rhythm.  . Current regimen:  o metoprolol tartrate 50mg , 1.5 (one and one-half) tablets twice daily o apixaban (Eliquis) 5mg  1 tablet twice daily  . Interventions: o We discussed:  monitoring for signs and symptoms for bleeding (coughing up blood, prolonged nose bleeds, black, tarry stools). . Patient self care activities o Patient will continue current medications as prescribed.   Diastolic heart failure . Pharmacist Clinical Goal(s) o Over the next 60 days, patient will work with PharmD and providers to maintain weight stable and check weight daily.  . Current regimen:   benazepril 40mg , 1 tablet once daily   metoprolol tartrate 50mg , 1.5 (one and one-half) tablets twice daily  furosemide 40mg , 1 tablet once daily . Interventions: o We discussed weighing daily; if you gain more than 3 pounds in one day or 5 pounds in one week call your doctor . Patient self care activities o Patient will take medications as prescribed and monitor weight.  Hypertension BP Readings from Last 3 Encounters:  01/21/20 128/72  12/27/19 139/83  12/09/19 124/74   . Pharmacist Clinical Goal(s): o Over the next 60 days, patient will work with PharmD and providers to maintain BP goal <130/80 . Current regimen:   benazepril 40mg , 1 tablet once daily  metoprolol tartrate 50mg , 1.5 (one and one-half) tablets twice daily . Patient self care activities - Over the next 60 days, patient will: o Check BP daily, document, and provide at future appointments o Ensure daily salt intake < 2300 mg/day  Hyperlipidemia Lab Results  Component Value  Date/Time   LDLCALC 79 01/19/2013 10:14 AM   . Pharmacist Clinical Goal(s): o Over the next 60 days, patient will work with PharmD and providers to achieve LDL goal < 70 . Current regimen:  o No medications.  . Interventions: o Overdue for lipid panel (cholesterol blood work) . Patient self care activities - Over the next 60 days, patient will: o Obtain cholesterol blood work.  COPD . Pharmacist Clinical Goal(s) o Over the next 60 days, patient will work with PharmD and providers to prevent worsening of shortness of breath and hospitalizations. . Current regimen:   tiotropium (Spiriva Respimat) 2.5 mcg/ act, inhale 2 puffs into lungs daily   albuterol (Proair HFA) 108 mcg/act inhaler, 2 puffs every six hours as needed for wheezing or shortness of breath  . Patient self care activities - Over the next 60 days, patient will: o Contact pulmonologist office and schedule office visit.   Medication management . Pharmacist Clinical Goal(s): o Over the next 60 days, patient will work with PharmD and providers to maintain optimal medication adherence . Current pharmacy: [facility] . Interventions o Comprehensive medication review performed. o Utilize [facility] for medication synchronization, packaging and delivery o Medication delivery coordinated with [facility] for delivery 01/28/20. o Submit proof of income and out-of-pocket expense report to [place] Patient assistance program. Fax: [phone] . Patient self care activities - Over the next 60 days, patient will: o Take medications as prescribed o Report any questions or concerns to PharmD and/or provider(s)  Please see past updates related to this goal by clicking on the "Past Updates" button in the selected goal         SDOH Interventions     Most Recent Value  SDOH Interventions  Financial Strain Interventions Other (Comment)  [Patient to submit proof of income and out-of-pocket expense  reports to [place] patient assistance program for Eliquis renewal.]       AFIB  Patient is currently rate controlled. HR: 70s BPM   Patient has failed these meds in past:   Patient is currently controlled on the following medications:   metoprolol tartrate 50mg , 1.5 (one and one-half) tablets twice daily   Anticoagulation  apixaban (Eliquis) 5mg  1 tablet twice daily  We discussed:  monitoring for signs and symptoms for bleeding (coughing up blood, prolonged nose bleeds, black, tarry stools).  Plan Continue current medications  Patient to submit proof of income and out-of- pocket expense reports to [employer] (only items pending for application completion).   Heart Failure  Type: Diastolic  Last ejection fraction: 55-60% (10/13/2017)   Patient has failed these meds in past: spironolactone   Patient is currently controlled on the following medications:   benazepril 40mg , 1 tablet once daily   metoprolol tartrate 50mg , 1.5 (one and one-half) tablets twice daily  furosemide 40mg , 1 tablet once daily  We discussed weighing daily;  if you gain more than 3 pounds in one day or 5 pounds in one week call your doctor- patient reports not weighing every day (at most every couple of days).   Plan Continue current medications.   Hypertension  Denies persistent dizziness/ lightheadedness/ orthostatic hypotension.  BP today is:  <130/80  Office blood pressures are  BP Readings from Last 3 Encounters:  01/21/20 128/72  12/27/19 139/83  12/09/19 124/74   Patient has failed these meds in the past: diltiazem  Patient checks BP at home daily  Patient home BP readings are ranging: 112/71 HR: 70s.   Patient is controlled on:   benazepril 40mg , 1 tablet once daily  metoprolol tartrate 50mg , 1.5 (one and one-half) tablets twice daily  Plan Continue current medications.    Hyperlipidemia/ coronary atherosclerosis  Lipid Panel     Component Value  Date/Time   CHOL 130 01/19/2013 1014   TRIG 87.0 01/19/2013 1014   HDL 33.60 (L) 01/19/2013 1014   CHOLHDL 4 01/19/2013 1014   VLDL 17.4 01/19/2013 1014   LDLCALC 79 01/19/2013 1014    The ASCVD Risk score (Goff DC Jr., et al., 2013) failed to calculate for the following reasons:   The 2013 ASCVD risk score is only valid for ages 21 to 44  Patient has failed these meds in past: simvastatin (myalgia)   Patient is currently controlled on the following medications:  nitroglycerin 0.4mg , 1 tablet under tongue every five minutes as needed for chest pain (x 3 pills) (last dose taken January 2021).   Plan Patient overdue for lipid panel. Patient does not remember trying other statins in the past.  Due to history of CAD, recommend adding statin.   COPD  Last spirometry score: none on file   Eosinophil count:   Lab Results  Component Value Date/Time   EOSPCT 4 08/06/2019 05:26 AM   EOSPCT 1.4 02/17/2017 09:32 AM  %                               Eos (Absolute):  Lab Results  Component Value Date/Time   EOSABS 0.3 08/06/2019 05:26 AM   EOSABS 0.1 02/17/2017 09:32 AM   Tobacco Status:  Social History   Tobacco Use  Smoking Status Former Smoker  . Packs/day: 0.50  . Years: 16.00  . Pack years: 8.00  . Types: Cigarettes  . Start date: 25  . Quit date: 07/16/1967  . Years since quitting: 52.5  Smokeless Tobacco Never Used   Patient has failed these meds in past: none  Flovent (per patient not using)   Patient is currently controlled on the following medications:   tiotropium (Spiriva Respimat) 2.5 mcg/ act, inhale 2 puffs into lungs daily   albuterol (Proair HFA) 108 mcg/act inhaler, 2 puffs every six hours as needed for wheezing or shortness of breath   Using maintenance inhaler regularly? Yes Frequency of rescue inhaler use:  2 times per day    We discussed:  proper inhaler technique  - patient notes shortness of breath with activity and at night.  - when discussing  Flovent, patient stated she does not use this and only uses Spiriva and albuterol as prescribed. Discussed [place] need Flovent due to reported use of albuterol, patient declined addition of inhalers due to cost. Discussed patient assistance [place] be done if needed.     Plan Patient to contact pulmonologist in order for more refills to be prescribed.  Osteoarthritis   Patient has failed these meds in past: none   Patient is currently controlled on the following medications:   acetaminophen (Tylenol) 500mg , 2 tablets every six hours as needed for moderate pain (patient reports taking at most once to twice daily)  Plan Continue current medications  Gout   Patient has failed these meds in past: allopurinol (not taking due to rash)  Patient is currently controlled on the following medications:   colchicine 0.6mg  as needed (last flare up was in January while hospitalized)  - patient reports not seeing rheumatologist anymore.   Plan Continue current medications   GERD   Patient is currently controlled on the following medications:   esomeprazole 40mg , 1 capsule once daily   We discussed:  non-pharmacological interventions for acid reflux. Take measures to prevent acid reflux, such as avoiding spicy foods, avoiding caffeine, avoid laying down a few hours after eating, and raising the head of the bed.  Plan Continue current medications  Reassess at follow up about taper plan/ receptivity on discontinuation.   Iron deficiency anemia    Hemoglobin & Hematocrit     Component Value Date/Time   HGB 12.8 09/06/2019 0000   HGB 11.7 (L) 07/21/2019 1031   HGB 11.7 08/04/2017 1610   HGB 11.7 02/17/2017 0932   HCT 40 09/06/2019 0000   HCT 37.4 08/04/2017 1610   HCT 39.0 02/17/2017 0932   Iron/TIBC/Ferritin/ %Sat    Component Value Date/Time   IRON 129 01/19/2018 0942   IRON 43 06/17/2016 1253   TIBC 290 01/19/2018 0942   TIBC 400 06/17/2016 1253   FERRITIN 101 01/19/2018 0942    FERRITIN 23 06/17/2016 1253   IRONPCTSAT 45 01/19/2018 0942   IRONPCTSAT 11 (L) 06/17/2016 1253   Patient is currently controlled on the following medications:   ferrous sulfate 325mg  1 tablet once daily with breakfast    Plan Continue current medications  Neuropathic pain   Patient is currently controlled on the following medications:   gabapentin 600mg , 1 tablet twice daily   Plan Continue current medications.    Anxiety/ depression  Patient is currently controlled on the following medications:   sertraline 50mg , 1 tablet once daily   Plan Continue current medications  Insomnia  Patient is currently controlled on the following medications:   trazodone 50mg , 1 tablet once daily at bedtime   lorazepam 0.5mg , 1 tablet twice daily as needed for anxiety or sleep (patient reports taking only at bedtime)   Plan Continue current medications.     CKD, Stage 3   Kidney Function Lab Results  Component Value Date/Time   CREATININE 1.38 (H) 12/09/2019 12:15 PM   CREATININE 1.53 (H) 10/05/2019 10:12 AM   CREATININE 1.3 (H) 02/17/2017 09:32 AM   CREATININE 1.5 (H) 09/10/2016 08:54 AM   GFR 32.06 (L) 10/05/2019 10:12 AM   GFRNONAA 34 (L) 12/09/2019 12:15 PM   GFRAA 40 (L) 12/09/2019 12:15 PM   K 4.5 12/09/2019 12:15 PM   K 4.4 10/05/2019 10:12 AM   K 4.6 02/17/2017 09:32 AM   K 5.4 (H) 09/10/2016 08:54 AM  Kidney function improved.   Plan Continue to monitor and adjust medications as needed.    Medication Management  Patient organizes medications: patient uses pill box  Barriers: Patient reports she is currently paying $45 for Spiriva which has decreased from the past and reports she can managed current copay. Patient to submit proof of income and out-of-pocket expense report to [employer] (patient obtained  patient assistance last year).  Primary pharmacy: [facility]   Follow up Follow up visit with PharmD in 2 months.     Anson Crofts,  PharmD Clinical Pharmacist [facility] [phone]

## 2020-01-20 DIAGNOSIS — L981 Factitial dermatitis: Secondary | ICD-10-CM | POA: Diagnosis not present

## 2020-01-20 DIAGNOSIS — D485 Neoplasm of uncertain behavior of skin: Secondary | ICD-10-CM | POA: Diagnosis not present

## 2020-01-20 DIAGNOSIS — I8311 Varicose veins of right lower extremity with inflammation: Secondary | ICD-10-CM | POA: Diagnosis not present

## 2020-01-20 DIAGNOSIS — L821 Other seborrheic keratosis: Secondary | ICD-10-CM | POA: Diagnosis not present

## 2020-01-20 DIAGNOSIS — I8312 Varicose veins of left lower extremity with inflammation: Secondary | ICD-10-CM | POA: Diagnosis not present

## 2020-01-21 ENCOUNTER — Encounter: Payer: Self-pay | Admitting: Primary Care

## 2020-01-21 ENCOUNTER — Ambulatory Visit: Payer: HMO | Admitting: Primary Care

## 2020-01-21 ENCOUNTER — Other Ambulatory Visit: Payer: Self-pay

## 2020-01-21 VITALS — BP 128/72 | HR 76 | Temp 97.3°F | Ht 64.0 in | Wt 189.4 lb

## 2020-01-21 DIAGNOSIS — R590 Localized enlarged lymph nodes: Secondary | ICD-10-CM

## 2020-01-21 DIAGNOSIS — K219 Gastro-esophageal reflux disease without esophagitis: Secondary | ICD-10-CM

## 2020-01-21 DIAGNOSIS — J432 Centrilobular emphysema: Secondary | ICD-10-CM

## 2020-01-21 MED ORDER — FLOVENT HFA 44 MCG/ACT IN AERO
1.0000 | INHALATION_SPRAY | Freq: Two times a day (BID) | RESPIRATORY_TRACT | 3 refills | Status: DC
Start: 2020-01-21 — End: 2020-07-24

## 2020-01-21 MED ORDER — SPIRIVA RESPIMAT 2.5 MCG/ACT IN AERS: 2.0000 | INHALATION_SPRAY | Freq: Every day | RESPIRATORY_TRACT | 3 refills | Status: DC

## 2020-01-21 MED ORDER — FAMOTIDINE 20 MG PO TABS: 20.0000 mg | ORAL_TABLET | Freq: Every day | ORAL | 3 refills | Status: DC

## 2020-01-21 NOTE — Assessment & Plan Note (Signed)
-   Mostly stable interval; she has moderate dyspnea on exertion and dry cough at night, unchanged from previous visit in September 2020 - No PFTs on file, intolerant to LABA/LAMA (unclear reason) - Continue Spiriva respimat 2.3mcg -2 puffs once daily in the morning - She has been on Flovent in the past, resuming low dose ICS 1 puff twice daily (rinse mouth after use)

## 2020-01-21 NOTE — Assessment & Plan Note (Addendum)
-   Nocturnal cough and "catch" when swallowing.  - She has moderate hiatal hernia on imaging  - Trial famotidine 20mg  qhs - No improvement recommend follow-up with GI

## 2020-01-21 NOTE — Patient Instructions (Addendum)
Pleasure seeing you today [name]  Emphysema: Continue Spiriva respimat -2 puffs once daily in the morning Starting low-dose steroid inhaler called Flovent-take 1 puff twice daily (rinse mouth after use)  Cough: Possible underlying GERD, trial famotidine 20 mg at bedtime If no improvement in swallowing recommend follow-up with GI ([facility]l[place] need test called barium swallow or possible endoscopy to evaluate further)  Orders: CT chest wo contrast in 2 months  re: mediastinal adenopathy    Follow-up: 3 to 4 months with Dr. Loanne Drilling

## 2020-01-21 NOTE — Progress Notes (Signed)
_0  ID: [name], female    DOB: [birth year], 86 y.o.   MRN: [id number]  Chief Complaint  Patient presents with  . Follow-up    COPD, occ SOB, cough esp at night, trouble swallowing    Referring provider: [name], NP  HPI: 86 year old female, former smoker quit 1969 (7.5-pack-year history).  Past medical history significant for centrilobular emphysema, community-acquired pneumonia, A. fib, colon cancer status post resection, coronary artery disease status post stent, chronic diastolic heart failure.  Patient of Dr. Loanne Drilling, last seen September 2020.  Patient did not tolerate LABA/LAMA in the past but is doing well on LAMA alone.  Patient was restarted on Spiriva 2.5.  Patient needs repeat CT chest in 1 year EZ:[id number] adenopathy.  Recommend follow-up in 3 months.  01/21/2020 Patient presents today for regular follow-up visit.  She was last seen in September 2020. She has baseline dyspnea with exertion, gets out of breath walking to the mail box. She reports dry cough at night. She has some associated wheezing. She reports a "catch" in her throat/chest when eating. She had a normal CXR in January 2021. She continues Spiriva respimat 2.43mg as prescribed. Due for follow-up CT chest to monitor mediastinal adenopathy in September 2021.    Chest imaging: CT Chest WO Contrast 04/06/18 - stable 2 mm nodular opacity in the right lower lobe. No pleural effusion evident. There is mild underlying centrilobular emphysematous change.  CT Chest 12/18/18 - No lymphadenopathy, centrilobular emphysema, stable 2 mm nodular opacity in superior segment right lower lobe  PFT: None on file  Echo: TTE 10/28/17 Study Conclusions  - Left ventricle: The cavity size was normal. There was severe focal basal and moderate concentric hypertrophy. Systolic function was normal. The estimated ejection fraction was in the range of 55% to 60%. Wall motion was normal; there were no regional  wall motion abnormalities. The study was not technically sufficient to allow evaluation of LV diastolic dysfunction due to atrial fibrillation. - Aortic valve: Mildly to moderately calcified annulus. Trileaflet; normal thickness, mildly calcified leaflets. - Mitral valve: There was moderate regurgitation. - Left atrium: The atrium was severely dilated. - Tricuspid valve: There was moderate regurgitation. - Pulmonic valve: There was trivial regurgitation. - Pulmonary arteries: PA peak pressure: 50 mm Hg (S).  Impressions:  - The right ventricular systolic pressure was increased consistent with moderate pulmonary hypertension.  Imaging, labs and test noted above have been reviewed independently by me.  No Known Allergies  Immunization History  Administered Date(s) Administered  . Fluad Quad(high Dose 65+) 04/05/2019  . Influenza Split 06/21/2011, 04/21/2012  . Influenza Whole 05/19/2007, 04/05/2008, 04/26/2010  . Influenza, High Dose Seasonal PF 05/17/2015, 05/22/2016, 04/11/2017, 04/01/2018  . Influenza,inj,Quad PF,6+ Mos 03/10/2013  . Influenza-Unspecified 04/14/2014  . PPD Test 06/10/2014, 06/24/2014, 08/18/2015  . Pneumococcal Conjugate-13 05/17/2015  . Pneumococcal Polysaccharide-23 02/10/2013  . Tdap 11/19/2010    Past Medical History:  Diagnosis Date  . Adenocarcinoma, colon ([facility] Oncologist-- Dr YTruitt Merle  Multifocal (2) Colon cancer @ ileocecal valve and ascending ( [id number]), Stage IIIB, grade I, MMR normal , 2 of 16 +lymph nodes, negative surgical margins---  06-02-2014  Right hemicolectomy w/ colostomy  . Allergy   . Anxiety   . Asthma    inhaler "sometimes"  . CAD (coronary artery disease)    a. LHC 4/09: pLAD 40, mDx 70-75, pLCx 30, mLCx 50, mRCA 90, EF 60% >> PCI: BMS x2 to RCA;  b. Myoview 8/12: normal  .  Cataract    bil cateracts removed  . Chronic anemia   . Chronic diastolic CHF (congestive heart failure) (HCC)    a. Echo 912 - Mild LVH, EF  55-60%, no RWMA, Gr 1 DD, mild MR, mild LAE, mild RAE, PASP 59 mmHg (mod to severe pulmo HTN)  . Clotting disorder (HCC)    hx of dvt, tia on eliquis  . Colostomy in place [facility])    s/p colostomy takedown 5/16  . Colovaginal fistula    s/p colostomy >> colostomy takedown 5/16  . COPD with chronic bronchitis ([place])   . Depression   . Dysrhythmia    A fib  . Essential hypertension   . GERD (gastroesophageal reflux disease)   . History of adenomatous polyp of colon   . History of blood transfusion   . History of DVT of lower extremity   . History of hiatal hernia   . History of TIA (transient ischemic attack)    a. Head CT 6/15: small chronic lacunar infarct in thalamus  . Hyperlipidemia   . OA (osteoarthritis)   . Osteoporosis   . Peripheral neuropathy   . Pneumonia   . Pulmonary HTN ([place])    a. PASP on Echo in 9/12:  59 mmHg  . Renal insufficiency   . Sigmoid diverticulosis    s/p sigmoid colectomy  . Stroke ([place])    TIAs  . Wears dentures   . Wears glasses     Tobacco History: Social History   Tobacco Use  Smoking Status Former Smoker  . Packs/day: 0.50  . Years: 16.00  . Pack years: 8.00  . Types: Cigarettes  . Start date: 55  . Quit date: 07/16/1967  . Years since quitting: 52.5  Smokeless Tobacco Never Used   Counseling given: Not Answered   Outpatient Medications Prior to Visit  Medication Sig Dispense Refill  . acetaminophen (TYLENOL) 500 MG tablet Take 1,000 mg by mouth every 6 (six) hours as needed for moderate pain.     Marland Kitchen albuterol (PROAIR HFA) 108 (90 Base) MCG/ACT inhaler Inhale 2 puffs into the lungs every 6 (six) hours as needed for wheezing or shortness of breath. 6.7 g 6  . apixaban (ELIQUIS) 5 MG TABS tablet Take 1 tablet (5 mg total) by mouth 2 (two) times daily. 180 tablet 0  . Ascorbic Acid (VITAMIN C) 1000 MG tablet Take 1,000 mg by mouth daily.    . benazepril (LOTENSIN) 40 MG tablet Take 1 tablet (40 mg total) by mouth daily. 90 tablet 1   . esomeprazole (NEXIUM) 40 MG capsule Take 1 capsule (40 mg total) by mouth daily. 90 capsule 1  . ferrous sulfate 325 (65 FE) MG EC tablet Take 325 mg by mouth daily with breakfast.    . furosemide (LASIX) 20 MG tablet Take 2 tablets (40 mg total) by mouth daily. 180 tablet 3  . gabapentin (NEURONTIN) 600 MG tablet Take 1 tablet (600 mg total) by mouth 2 (two) times daily. 180 tablet 1  . hydrocortisone (ANUSOL-HC) 2.5 % rectal cream Place 1 application rectally 2 (two) times daily. (Patient taking differently: Place 1 application rectally 2 (two) times daily as needed. ) 30 g 1  . LORazepam (ATIVAN) 0.5 MG tablet Take 1 tablet (0.5 mg total) by mouth 2 (two) times daily as needed for anxiety or sleep. (Patient taking differently: Take 0.5 mg by mouth at bedtime. ) 60 tablet 1  . metoprolol tartrate (LOPRESSOR) 50 MG tablet Take 1.5 tablets (75  mg total) by mouth 2 (two) times daily. 270 tablet 1  . nitroGLYCERIN (NITROSTAT) 0.4 MG SL tablet Place 1 tablet (0.4 mg total) under the tongue every 5 (five) minutes as needed for chest pain (x 3 pills). Reported on 09/01/2015 25 tablet 3  . sertraline (ZOLOFT) 50 MG tablet Take 1 tablet (50 mg total) by mouth daily. 90 tablet 1  . Spacer/Aero-Holding Chambers (E-Z SPACER) inhaler Use as instructed 1 each 2  . traZODone (DESYREL) 50 MG tablet Take 1 tablet (50 mg total) by mouth at bedtime. 90 tablet 1  . triamcinolone cream (KENALOG) 0.1 % Apply 1 application topically 2 (two) times daily.    . Tiotropium Bromide Monohydrate (SPIRIVA RESPIMAT) 2.5 MCG/ACT AERS Inhale 2 puffs into the lungs daily. 4 g 3   No facility-administered medications prior to visit.    Review of Systems  Review of Systems  Constitutional: Negative for chills, diaphoresis and fever.  HENT: Negative for congestion and postnasal drip.   Respiratory: Positive for cough, shortness of breath and wheezing.   Cardiovascular: Positive for leg swelling.     Physical Exam  BP  128/72 (BP Location: Left Arm, Cuff Size: Normal)   Pulse 76   Temp (!) 97.3 F (36.3 C) (Oral)   Ht 5' 4" (1.626 m)   Wt 189 lb 6.4 oz (85.9 kg)   SpO2 99%   BMI 32.51 kg/m  Physical Exam Constitutional:      General: She is not in acute distress.    Appearance: Normal appearance. She is not ill-appearing.  HENT:     Head: Normocephalic and atraumatic.     Mouth/Throat:     Mouth: Mucous membranes are moist.     Pharynx: Oropharynx is clear.  Cardiovascular:     Rate and Rhythm: Normal rate and regular rhythm.  Pulmonary:     Breath sounds: No wheezing or rhonchi.     Comments: Clear to auscultation  Musculoskeletal:     Comments: Ambulates with walker  Skin:    General: Skin is warm and dry.  Neurological:     General: No focal deficit present.     Mental Status: She is alert and oriented to person, place, and time. Mental status is at baseline.  Psychiatric:        Mood and Affect: Mood normal.        Behavior: Behavior normal.        Thought Content: Thought content normal.        Judgment: Judgment normal.      Lab Results:  CBC    Component Value Date/Time   WBC 5.1 09/06/2019 0000   WBC 6.8 08/06/2019 0526   RBC 4.14 09/06/2019 0000   HGB 12.8 09/06/2019 0000   HGB 11.7 (L) 07/21/2019 1031   HGB 11.7 08/04/2017 1610   HGB 11.7 02/17/2017 0932   HCT 40 09/06/2019 0000   HCT 37.4 08/04/2017 1610   HCT 39.0 02/17/2017 0932   PLT 109 (A) 09/06/2019 0000   PLT 162 07/21/2019 1031   PLT 184 08/04/2017 1610   MCV 97.2 08/06/2019 0526   MCV 87 08/04/2017 1610   MCV 84.1 02/17/2017 0932   MCH 30.4 08/06/2019 0526   MCHC 31.3 08/06/2019 0526   RDW 14.2 08/06/2019 0526   RDW 14.6 08/04/2017 1610   RDW 22.9 (H) 02/17/2017 0932   LYMPHSABS 1.1 08/06/2019 0526   LYMPHSABS 1.6 02/17/2017 0932   MONOABS 0.8 08/06/2019 0526   MONOABS 0.7 02/17/2017  0932   EOSABS 0.3 08/06/2019 0526   EOSABS 0.1 02/17/2017 0932   BASOSABS 0.0 08/06/2019 0526   BASOSABS 0.0  02/17/2017 0932    BMET    Component Value Date/Time   NA 141 12/09/2019 1215   NA 137 02/17/2017 0932   K 4.5 12/09/2019 1215   K 4.6 02/17/2017 0932   CL 106 12/09/2019 1215   CO2 21 12/09/2019 1215   CO2 27 02/17/2017 0932   GLUCOSE 93 12/09/2019 1215   GLUCOSE 94 10/05/2019 1012   GLUCOSE 102 02/17/2017 0932   BUN 34 (H) 12/09/2019 1215   BUN 26.7 (H) 02/17/2017 0932   CREATININE 1.38 (H) 12/09/2019 1215   CREATININE 1.3 (H) 02/17/2017 0932   CALCIUM 9.3 12/09/2019 1215   CALCIUM 9.5 02/17/2017 0932   GFRNONAA 34 (L) 12/09/2019 1215   GFRAA 40 (L) 12/09/2019 1215    BNP    Component Value Date/Time   BNP 345.2 (H) 08/02/2019 2338   BNP 443.2 (H) 07/25/2015 1435    ProBNP    Component Value Date/Time   PROBNP 366.0 (H) 08/04/2017 1222    Imaging: VAS [place] LOWER EXTREMITY VENOUS REFLUX  Result Date: 12/27/2019  Lower Venous Reflux Study Indications: Swelling.  Performing Technologist: Ralene Cork RVT  Examination Guidelines: A complete evaluation includes B-mode imaging, spectral Doppler, color Doppler, and power Doppler as needed of all accessible portions of each vessel. Bilateral testing is considered an integral part of a complete examination. Limited examinations for reoccurring indications [place] be performed as noted. The reflux portion of the exam is performed with the patient in reverse Trendelenburg. Significant venous reflux is defined as >500 ms in the superficial venous system, and >1 second in the deep venous system.  Venous Reflux Times +--------------+---------+------+-----------+------------+--------+ RIGHT         Reflux NoRefluxReflux TimeDiameter cmsComments                         Yes                                  +--------------+---------+------+-----------+------------+--------+ CFV                     yes   >1 second                      +--------------+---------+------+-----------+------------+--------+ FV mid        no                                              +--------------+---------+------+-----------+------------+--------+ Popliteal     no                                             +--------------+---------+------+-----------+------------+--------+ GSV at SFJ              yes    >500 ms     0.564             +--------------+---------+------+-----------+------------+--------+ GSV prox thigh          yes    >500 ms     0.328             +--------------+---------+------+-----------+------------+--------+  GSV mid thigh           yes    >500 ms     0.342             +--------------+---------+------+-----------+------------+--------+ GSV dist thighno                           0.308             +--------------+---------+------+-----------+------------+--------+ GSV at knee   no                           0.301             +--------------+---------+------+-----------+------------+--------+ GSV prox calf           yes    >500 ms     0.219             +--------------+---------+------+-----------+------------+--------+ SSV Pop Fossa no                           0.292             +--------------+---------+------+-----------+------------+--------+ SSV prox calf no                           0.269             +--------------+---------+------+-----------+------------+--------+ SSV mid calf  no                           0.223             +--------------+---------+------+-----------+------------+--------+  +--------------+---------+------+-----------+------------+--------+ LEFT          Reflux NoRefluxReflux TimeDiameter cmsComments                         Yes                                  +--------------+---------+------+-----------+------------+--------+ CFV                     yes   >1 second                      +--------------+---------+------+-----------+------------+--------+ FV mid                  yes   >1 second                       +--------------+---------+------+-----------+------------+--------+ Popliteal               yes   >1 second                      +--------------+---------+------+-----------+------------+--------+ GSV at [facility]    no                           0.396             +--------------+---------+------+-----------+------------+--------+ GSV prox thigh          yes    >500 ms     0.396             +--------------+---------+------+-----------+------------+--------+ GSV mid thigh  yes    >500 ms     0.369             +--------------+---------+------+-----------+------------+--------+ GSV dist thigh          yes    >500 ms      0.29             +--------------+---------+------+-----------+------------+--------+ GSV at knee             yes    >500 ms     0.297             +--------------+---------+------+-----------+------------+--------+ GSV prox calf           yes    >500 ms     0.262             +--------------+---------+------+-----------+------------+--------+ SSV Pop Fossa no                            0.37             +--------------+---------+------+-----------+------------+--------+ SSV prox calf           yes    >500 ms     0.377             +--------------+---------+------+-----------+------------+--------+ SSV mid calf            yes    >500 ms     0.311             +--------------+---------+------+-----------+------------+--------+   Summary: Right: - No evidence of deep vein thrombosis seen in the right lower extremity, from the common femoral through the popliteal veins. - No evidence of superficial venous thrombosis in the right lower extremity. - Deep vein reflux in the CFV. - Superficial vein reflux in the SFJ and GSV as above.  Left: - No evidence of deep vein thrombosis seen in the left lower extremity, from the common femoral through the popliteal veins. - No evidence of superficial venous thrombosis in the left lower extremity. - Deep  vein reflux in the CFV, FV, and popliteal vein. - Superficial vein reflux in the SSV and GSV as above. *See table(s) above for measurements and observations. Electronically signed by Harold Barban MD on 12/27/2019 at 12:44:46 PM.    Final      Assessment & Plan:   Centrilobular emphysema ([place]) - Mostly stable interval; she has moderate dyspnea on exertion and dry cough at night, unchanged from previous visit in September 2020 - No PFTs on file, intolerant to LABA/LAMA (unclear reason) - Continue Spiriva respimat 2.36mg -2 puffs once daily in the morning - She has been on Flovent in the past, resuming low dose ICS 1 puff twice daily (rinse mouth after use)  GERD - Nocturnal cough and "catch" when swallowing.  - She has moderate hiatal hernia on imaging  - Trial famotidine 246mqhs - No improvement recommend follow-up with GI   Mediastinal adenopathy - CT chest in June 2020 showed previously noted lymph node anterior to the carina currently measures 0.9 x 0.9cm, upper normal in size and slightlysmaller than on the previous  - Due for follow-up CT chest wo contrast in September 2021, this has been ordered    ElMartyn EhrichNP 01/21/2020

## 2020-01-21 NOTE — Assessment & Plan Note (Addendum)
-   CT chest in June 2020 showed previously noted lymph node anterior to the carina currently measures 0.9 x 0.9cm, upper normal in size and slightlysmaller than on the previous  - Due for follow-up CT chest wo contrast in September 2021, this has been ordered

## 2020-01-24 NOTE — Patient Instructions (Addendum)
Visit Information  Goals Addressed            This Visit's Progress   . Pharmacy Care Plan       CARE PLAN ENTRY (see longitudinal plan of care for additional care plan information)  Current Barriers:  . Chronic Disease Management support, education, and care coordination needs related to Hypertension, Hyperlipidemia, Atrial Fibrillation, Heart Failure, and Pulmonary emphysema    Atrial fibrillation (Afib) . Pharmacist Clinical Goal(s) o Over the next 60 days, patient will work with PharmD and providers to maintain normal sinus rhythm.  . Current regimen:  o metoprolol tartrate 50mg , 1.5 (one and one-half) tablets twice daily o apixaban (Eliquis) 5mg  1 tablet twice daily  . Interventions: o We discussed:  monitoring for signs and symptoms for bleeding (coughing up blood, prolonged nose bleeds, black, tarry stools). . Patient self care activities o Patient will continue current medications as prescribed.   Diastolic heart failure . Pharmacist Clinical Goal(s) o Over the next 60 days, patient will work with PharmD and providers to maintain weight stable and check weight daily.  . Current regimen:   benazepril 40mg , 1 tablet once daily   metoprolol tartrate 50mg , 1.5 (one and one-half) tablets twice daily  furosemide 40mg , 1 tablet once daily . Interventions: o We discussed weighing daily; if you gain more than 3 pounds in one day or 5 pounds in one week call your doctor . Patient self care activities o Patient will take medications as prescribed and monitor weight.   Hypertension BP Readings from Last 3 Encounters:  01/21/20 128/72  12/27/19 139/83  12/09/19 124/74   . Pharmacist Clinical Goal(s): o Over the next 60 days, patient will work with PharmD and providers to maintain BP goal <130/80 . Current regimen:   benazepril 40mg , 1 tablet once daily  metoprolol tartrate 50mg , 1.5 (one and one-half) tablets twice daily . Patient self care activities - Over the next  60 days, patient will: o Check BP daily, document, and provide at future appointments o Ensure daily salt intake < 2300 mg/day  Hyperlipidemia Lab Results  Component Value Date/Time   LDLCALC 79 01/19/2013 10:14 AM   . Pharmacist Clinical Goal(s): o Over the next 60 days, patient will work with PharmD and providers to achieve LDL goal < 70 . Current regimen:  o No medications.  . Interventions: o Overdue for lipid panel (cholesterol blood work) . Patient self care activities - Over the next 60 days, patient will: o Obtain cholesterol blood work.  COPD . Pharmacist Clinical Goal(s) o Over the next 60 days, patient will work with PharmD and providers to prevent worsening of shortness of breath and hospitalizations. . Current regimen:   tiotropium (Spiriva Respimat) 2.5 mcg/ act, inhale 2 puffs into lungs daily   albuterol (Proair HFA) 108 mcg/act inhaler, 2 puffs every six hours as needed for wheezing or shortness of breath  . Patient self care activities - Over the next 60 days, patient will: o Contact pulmonologist office and schedule office visit.   Medication management . Pharmacist Clinical Goal(s): o Over the next 60 days, patient will work with PharmD and providers to maintain optimal medication adherence . Current pharmacy: [facility] . Interventions o Comprehensive medication review performed. o Utilize [facility] pharmacy for medication synchronization, packaging and delivery . Patient self care activities - Over the next 60 days, patient will: o Take medications as prescribed o Report any questions or concerns to PharmD and/or provider(s)  Please see past updates  related to this goal by clicking on the "Past Updates" button in the selected goal          [name] was given information about Chronic Care Management services today including:  1. CCM service includes personalized support from designated clinical staff supervised by her physician, including  individualized plan of care and coordination with other care providers 2. 24/7 contact phone numbers for assistance for urgent and routine care needs. 3. Standard insurance, coinsurance, copays and deductibles apply for chronic care management only during months in which we provide at least 20 minutes of these services. Most insurances cover these services at 100%, however patients Haro be responsible for any copay, coinsurance and/or deductible if applicable. This service [place] help you avoid the need for more expensive face-to-face services. 4. Only one practitioner Mertens furnish and bill the service in a calendar month. 5. The patient Coble stop CCM services at any time (effective at the end of the month) by phone call to the office staff.  Patient agreed to services and verbal consent obtained.   The patient verbalized understanding of instructions provided today and agreed to receive a mailed copy of patient instruction and/or educational materials.  Telephone follow up appointment with pharmacy team member scheduled for: 03/29/2020  Anson Crofts, PharmD Clinical Pharmacist [facility] [place] [phone]   Heart Failure, Self Care Heart failure is a serious condition. This sheet explains things you need to do to take care of yourself at home. To help you stay as healthy as possible, you Marseille be asked to change your diet, take certain medicines, and make other changes in your life. Your doctor Raiche also give you more specific instructions. If you have problems or questions, call your doctor. What are the risks? Having heart failure makes it more likely for you to have some problems. These problems can get worse if you do not take good care of yourself. Problems Kettles include:  Blood clotting problems. This Ospina cause a stroke.  Damage to the kidneys, liver, or lungs.  Abnormal heart rhythms. Supplies needed:  Scale for weighing yourself.  Blood pressure  monitor.  Notebook.  Medicines. How to care for yourself when you have heart failure Medicines Take over-the-counter and prescription medicines only as told by your doctor. Take your medicines every day.  Do not stop taking your medicine unless your doctor tells you to do so.  Do not skip any medicines.  Get your prescriptions refilled before you run out of medicine. This is important. Eating and drinking   Eat heart-healthy foods. Talk with a diet specialist (dietitian) to create an eating plan.  Choose foods that: ? Have no trans fat. ? Are low in saturated fat and cholesterol.  Choose healthy foods, such as: ? Fresh or frozen fruits and vegetables. ? Fish. ? Low-fat (lean) meats. ? Legumes, such as beans, peas, and lentils. ? Fat-free or low-fat dairy products. ? Whole-grain foods. ? High-fiber foods.  Limit salt (sodium) if told by your doctor. Ask your diet specialist to tell you which seasonings are healthy for your heart.  Cook in healthy ways instead of frying. Healthy ways of cooking include roasting, grilling, broiling, baking, poaching, steaming, and stir-frying.  Limit how much fluid you drink, if told by your doctor. Alcohol use  Do not drink alcohol if: ? Your doctor tells you not to drink. ? Your heart was damaged by alcohol, or you have very bad heart failure. ? You are pregnant,  Heindel be pregnant, or are planning to become pregnant.  If you drink alcohol: ? Limit how much you use to:  0-1 drink a day for women.  0-2 drinks a day for men. ? Be aware of how much alcohol is in your drink. In the [place] one drink equals one 12 oz bottle of beer (355 mL), one 5 oz glass of wine (148 mL), or one 1 oz glass of hard liquor (44 mL). Lifestyle   Do not use any products that contain nicotine or tobacco, such as cigarettes, e-cigarettes, and chewing tobacco. If you need help quitting, ask your doctor. ? Do not use nicotine gum or patches before talking to  your doctor.  Do not use illegal drugs.  Lose weight if told by your doctor.  Do physical activity if told by your doctor. Talk to your doctor before you begin an exercise if: ? You are an older adult. ? You have very bad heart failure.  Learn to manage stress. If you need help, ask your doctor.  Get rehab (rehabilitation) to help you stay independent and to help with your quality of life.  Plan time to rest when you get tired. Check weight and blood pressure   Weigh yourself every day. This will help you to know if fluid is building up in your body. ? Weigh yourself every morning after you pee (urinate) and before you eat breakfast. ? Wear the same amount of clothing each time. ? Write down your daily weight. Give your record to your doctor.  Check and write down your blood pressure as told by your doctor.  Check your pulse as told by your doctor. Dealing with very hot and very cold weather  If it is very hot: ? Avoid activities that take a lot of energy. ? Use air conditioning or fans, or find a cooler place. ? Avoid caffeine and alcohol. ? Wear clothing that is loose-fitting, lightweight, and light-colored.  If it is very cold: ? Avoid activities that take a lot of energy. ? Layer your clothes. ? Wear mittens or gloves, a hat, and a scarf when you go outside. ? Avoid alcohol. Follow these instructions at home:  Stay up to date with shots (vaccines). Get pneumococcal and flu (influenza) shots.  Keep all follow-up visits as told by your doctor. This is important. Contact a doctor if:  You gain weight quickly.  You have increasing shortness of breath.  You cannot do your normal activities.  You get tired easily.  You cough a lot.  You don't feel like eating or feel like you Kapral vomit (nauseous).  You become puffy (swell) in your hands, feet, ankles, or belly (abdomen).  You cannot sleep well because it is hard to breathe.  You feel like your heart is  beating fast (palpitations).  You get dizzy when you stand up. Get help right away if:  You have trouble breathing.  You or someone else notices a change in your behavior, such as having trouble staying awake.  You have chest pain or discomfort.  You pass out (faint). These symptoms Slager be an emergency. Do not wait to see if the symptoms will go away. Get medical help right away. Call your local emergency services (911 in the [place] Do not drive yourself to the hospital. Summary  Heart failure is a serious condition. To care for yourself, you Gladden have to change your diet, take medicines, and make other lifestyle changes.  Take your medicines every day.  Do not stop taking them unless your doctor tells you to do so.  Eat heart-healthy foods, such as fresh or frozen fruits and vegetables, fish, lean meats, legumes, fat-free or low-fat dairy products, and whole-grain or high-fiber foods.  Ask your doctor if you can drink alcohol. You Pelzel have to stop alcohol use if you have very bad heart failure.  Contact your doctor if you gain weight quickly or feel that your heart is beating too fast. Get help right away if you pass out, or have chest pain or trouble breathing. This information is not intended to replace advice given to you by your health care provider. Make sure you discuss any questions you have with your health care provider. Document Revised: 10/13/2018 Document Reviewed: 10/14/2018 Elsevier Patient Education  [employer].

## 2020-01-24 NOTE — Chronic Care Management (AMB) (Signed)
At time of follow-up visit, patient requested medication coordination.   Reviewed OV, consults since last care coordination call/Pharmacist visit and are updated in visit note.   Medication changes 01/21/2020- pulmonary- Geraldo Pitter, NP- Flovent, 1 puff BID and famotidine 20mg  at bedtime started.   BP Readings from Last 3 Encounters:  01/21/20 128/72  12/27/19 139/83  12/09/19 124/74    Lab Results  Component Value Date   HGBA1C 5.9 (H) 05/31/2014     Patient obtains medications through Vials  90 Days   Patient is due for next adherence delivery on: 01/28/2020 Called patient and reviewed medications and coordinated delivery.  This delivery to include: . Apixaban (Eliquis) 5mg , 1 tablet BID (working on obtaining through patient assistance program) . Benazepril 40mg , 1 tablet once daily . Esomeprazole 40mg , 1 capsule daily . Famotidine 20mg , 1 tablet at bedtime . Flovent 13mcg inhaler, 1 puff BID . Furosemide 20mg , 2 tablets once daily . Gabapentin 600mg , 1 tablet BID . Lorazepam 0.5mg , 1 tablet BID . Metoprolol tartrate 50mg , 1.5 tablets BID, . Sertraline 50mg , 1 tablet once daily . Spiriva 2.64mcg, 2 puffs once daily . Trazodone 50mg , 1 tablet at bedtime     Patient needs refills for:   lorazepam 0.5mg , 1 tablet BID (Prescribed by [name], NP)  Confirmed delivery date of 01/28/2020, advised patient that pharmacy will contact them the morning of delivery.   Anson Crofts, PharmD Clinical Pharmacist [facility] [place] [phone]

## 2020-01-25 ENCOUNTER — Other Ambulatory Visit: Payer: Self-pay | Admitting: Adult Health

## 2020-01-25 ENCOUNTER — Other Ambulatory Visit: Payer: Self-pay

## 2020-01-25 MED ORDER — LORAZEPAM 0.5 MG PO TABS: 0.5000 mg | ORAL_TABLET | Freq: Every day | ORAL | 2 refills | Status: DC

## 2020-01-25 NOTE — Patient Outreach (Signed)
[facility]) Care Management Chronic Special Needs Program  01/25/2020  Name: [name] DOB: Feb 12, 1930  MRN: [id number]  Ms. [name] is enrolled in a chronic special needs plan for Heart Failure. Reviewed and updated care plan.  Subjective: Client states that her leg is a lot better but she still has swelling and a area that drains sometimes.  States she is wearing her hose and keeping her legs propped up as much as she can.  States she saw the vascular doctor and there is nothing more they can do for her legs. States she is weighing most days and her weight is stable from 180-182 most days.  States she is following a low salt diet.  Denies any chest pains and only short of breath when she walks to the mailbox.  States she uses her inhalers as ordered and does not need to use her Albuterol very often.  States she is taking her blood thinner for her atrial fibrillation.  Denies any falls.  States she is getting her medications delivered from [facility] and states she can afford her medications at this time.  States her daughter helps her as needed  Goals Addressed            This Visit's Progress   . COMPLETED: Client understands the importance of follow-up with providers by attending scheduled visits   On track    Keeping scheduled appointments Reinforced to keep scheduled appointments with providers Goal completed 01/25/20    . Client will not report change from baseline and no repeated symptoms of stroke with in the next 6 months (continue 01/25/20)   On track    Reports no stroke symptoms Reviewed stoke symptoms and when to call 911    . Client will report no fall or injuries in the next 6 months.(continue 7/134/21)   On track    Reports no falls Stand up slowly Use cane or walker at all times Use grabber to pick up objects on the floor Keep home well-lit and wear your glasses    . Client will report no worsening of symptoms of Atrial Fibrillation within the  next 6 months(continue 01/25/20)   On track    No worsening of symptoms and taking anticoagulant as directed Reviewed signs, symptoms and monitoring for worsening atrial fibrillation Reviewed atrial fibrillation action plan Reviewed importance of taking anticoagulant to prevent stroke    . Client will report no worsening of symptoms related to heart disease within the next 6 months(continue 01/25/20)   On track    No worsening of heart disease symptoms Notify provider for symptoms of chest pain, sweating, nausea/vomiting, irregular heartbeat, palpitations, rapid heart rate, shortness of breath or dizziness or fainting. Call 911 for severe symptoms of chest pain or shortness of breath. Take medications as prescribed     . Client will report the importance of monitoring weight for Heart Failure management as evidenced by verbalizing signs of exacerbation.   On track    No admissions or ED visits for heart failure Signs and symptoms of heart failure reviewed Education provided for self-management of Heart Disease. Advised to notify doctor for symptoms Call 911 for severe symptoms Weigh daily and record weighs Follow a low salt diet    . Client will verbalize knowledge of chronic lung disease as evidenced by no ED visits or Inpatient stays related to chronic lung disease    On track    No ED or hospitalization for lung disease Reviewed use  of inhalers Contact your provider for any signs and symptoms of mild shortness of breath Call 911 if you are having severe shortness of breath    . Client will verbalize knowledge of self management of Hypertension as evidences by BP reading of 140/90 or less; or as defined by provider   On track    Reports B/P less than 140/90 when checking  Plan to check B/P regularly Take B/P medications as ordered Plan to follow a low salt diet  Increase activity as tolerated    . Client will verbalize understanding of treatment plan for impaired skin integrity(rt  leg cellulitis) and follow up with provider by next 6 months   On track    Reinforced signs and symptoms to call provider Keep legs elevated Wear compression hose as directed Keep follow up appointments  with provider Call provider for increased swelling, redness or open skin    . Decrease inpatient Heart Failure admissions/ readmissions with in the year   On track    No heart failure admissions    . Decrease the use of hospital emergency department related to heart failure within the next year   On track    No heart failure admission/ED visits Call [facility]) Nurse Advise Line [phone] if needed    . Maintain timely refills of Heart Failure medication as prescribed within the year    On track    Maintaining timely refills of medications per dispense report Reinforced importance of getting medications refilled on time    . Obtain annual  Lipid Profile, LDL-C   Not on track    No recent lipid profile The goal for LDL is less than 70 mg/dL as you are at high risk for complications Try to avoid saturated fats, trans-fats and eat more fiber     . Patient Stated goal to weigh 2-3 times a week and record weights for the next 6 months   On track    Reports weighing at least 3-4 times a week Reinforced importance of weighting daily and to record weights Reinforced heart failure zones and when to call MD    . Visit Primary Care Provider or Cardiologist at least 2 times per year   On track    Primary care provider 08/13/19, 11/03/19 Cardiologist 12/09/19 Please schedule your annual wellness visit       Plan:  Send successful outreach letter with a copy of their individualized care plan and Send individual care plan to provider  Client will be outreached by a [facility] [facility]) RNCM in 6 months per tier level     [place], [name], [name] Management [phone]

## 2020-01-28 ENCOUNTER — Other Ambulatory Visit: Payer: Self-pay | Admitting: Cardiovascular Disease

## 2020-01-28 NOTE — Telephone Encounter (Signed)
Jeani Hawking, LPN can you please advise on this matter. Thanks

## 2020-01-28 NOTE — Telephone Encounter (Signed)
Patient calling the office for samples of medication:   1.  What medication and dosage are you requesting samples for? Eliquis  2.  Are you currently out of this medication? Today is her last pills- pt waiting to see if she will be getting assistant with her Eliquis

## 2020-01-28 NOTE — Telephone Encounter (Addendum)
**Note De-Identified Tonie Elsey Obfuscation** I called Annette back at [employer] but got no answer so I left a detailed message asking her/someone to call Jeani Hawking at Dr Elmarie Shiley office at [phone] to discuss the pt applying for asst with [facility] as I have no knowledge of her applying for Eliquis asst. this year.

## 2020-01-28 NOTE — Telephone Encounter (Signed)
Anne Ng called me back and stated that they helped the pt apply for asst for her Eliquis through  [facility] but that they just sent the application in and the pt is down to her last tablet. She is requesting Eliquis 5 mg sample for the pt to take until they hear back from [facility].  I advised Anne Ng that we will leave the pt 2 boxes of Eliquis 5 mg in our front office for her to pick up. Anne Ng states that she Prine pick the samples up herself and deliver to the pts home as they normally deliver all of her medications to her.  Anne Ng thanked me for our assistance.

## 2020-01-30 ENCOUNTER — Encounter: Payer: Self-pay | Admitting: Adult Health

## 2020-02-01 ENCOUNTER — Other Ambulatory Visit: Payer: Self-pay

## 2020-02-02 ENCOUNTER — Ambulatory Visit (INDEPENDENT_AMBULATORY_CARE_PROVIDER_SITE_OTHER): Payer: HMO | Admitting: Adult Health

## 2020-02-02 ENCOUNTER — Encounter: Payer: Self-pay | Admitting: Adult Health

## 2020-02-02 VITALS — BP 126/80 | Temp 97.5°F | Wt 191.0 lb

## 2020-02-02 DIAGNOSIS — L03012 Cellulitis of left finger: Secondary | ICD-10-CM

## 2020-02-02 MED ORDER — PREDNISONE 20 MG PO TABS: 20.0000 mg | ORAL_TABLET | Freq: Every day | ORAL | 0 refills | Status: DC

## 2020-02-02 MED ORDER — DOXYCYCLINE HYCLATE 100 MG PO CAPS: 100.0000 mg | ORAL_CAPSULE | Freq: Two times a day (BID) | ORAL | 0 refills | Status: DC

## 2020-02-02 NOTE — Progress Notes (Signed)
Subjective:    Patient ID: [name], female    DOB: [birth year], 86 y.o.   MRN: [id number]  HPI 86 year old female who  has a past medical history of Adenocarcinoma, colon ([place]) (Oncologist-- Dr Truitt Merle), Allergy, Anxiety, Asthma, CAD (coronary artery disease), Cataract, Chronic anemia, Chronic diastolic CHF (congestive heart failure) ([place]), Clotting disorder ([place]), Colostomy in place [facility]), Colovaginal fistula, COPD with chronic bronchitis ([place]), Depression, Dysrhythmia, Essential hypertension, GERD (gastroesophageal reflux disease), History of adenomatous polyp of colon, History of blood transfusion, History of DVT of lower extremity, History of hiatal hernia, History of TIA (transient ischemic attack), Hyperlipidemia, OA (osteoarthritis), Osteoporosis, Peripheral neuropathy, Pneumonia, Pulmonary HTN ([place]), Renal insufficiency, Sigmoid diverticulosis, Stroke ([place]), Wears dentures, and Wears glasses.  She presents to the office today with her daughter for an acute issue of left ring finger pain, swelling, and redness.  Her symptoms have been present for approximately 2 weeks.  She does have a history of gout and was taking leftover colchicine in which she noticed some mild improvement in the edema.  She denies trauma or aggravating injury.  She has not noticed any drainage from underneath her fingernail   Review of Systems See HPI   Past Medical History:  Diagnosis Date  . Adenocarcinoma, colon [facility]) Oncologist-- Dr Truitt Merle   Multifocal (2) Colon cancer @ ileocecal valve and ascending ( [id number]), Stage IIIB, grade I, MMR normal , 2 of 16 +lymph nodes, negative surgical margins---  06-02-2014  Right hemicolectomy w/ colostomy  . Allergy   . Anxiety   . Asthma    inhaler "sometimes"  . CAD (coronary artery disease)    a. LHC 4/09: pLAD 40, mDx 70-75, pLCx 30, mLCx 50, mRCA 90, EF 60% >> PCI: BMS x2 to RCA;  b. Myoview 8/12: normal  . Cataract    bil cateracts removed  . Chronic anemia    . Chronic diastolic CHF (congestive heart failure) (HCC)    a. Echo 912 - Mild LVH, EF 55-60%, no RWMA, Gr 1 DD, mild MR, mild LAE, mild RAE, PASP 59 mmHg (mod to severe pulmo HTN)  . Clotting disorder (H[facility])    hx of dvt, tia on eliquis  . Colostomy in place [facility])    s/p colostomy takedown 5/16  . Colovaginal fistula    s/p colostomy >> colostomy takedown 5/16  . COPD with chronic bronchitis ([place])   . Depression   . Dysrhythmia    A fib  . Essential hypertension   . GERD (gastroesophageal reflux disease)   . History of adenomatous polyp of colon   . History of blood transfusion   . History of DVT of lower extremity   . History of hiatal hernia   . History of TIA (transient ischemic attack)    a. Head CT 6/15: small chronic lacunar infarct in thalamus  . Hyperlipidemia   . OA (osteoarthritis)   . Osteoporosis   . Peripheral neuropathy   . Pneumonia   . Pulmonary HTN ([place])    a. PASP on Echo in 9/12:  59 mmHg  . Renal insufficiency   . Sigmoid diverticulosis    s/p sigmoid colectomy  . Stroke ([place])    TIAs  . Wears dentures   . Wears glasses     Social History   Socioeconomic History  . Marital status: Widowed    Spouse name: Not on file  . Number of children: Not on file  . Years of education: Not on  file  . Highest education level: Not on file  Occupational History  . Not on file  Tobacco Use  . Smoking status: Former Smoker    Packs/day: 0.50    Years: 16.00    Pack years: 8.00    Types: Cigarettes    Start date: 72    Quit date: 07/16/1967    Years since quitting: 52.5  . Smokeless tobacco: Never Used  Vaping Use  . Vaping Use: Never used  Substance and Sexual Activity  . Alcohol use: No    Alcohol/week: 0.0 standard drinks  . Drug use: No  . Sexual activity: Not on file  Other Topics Concern  . Not on file  Social History Narrative   Married, 2 children.  As of November 2015 her husband has been living in a nursing home for tendon after years as  he suffers from multiple medical problems.   Patient does not drink alcohol nor does she smoke or chew tobacco products   Right handed   8th grade   1 cup daily   Social Determinants of Health   Financial Resource Strain: Medium Risk  . Difficulty of Paying Living Expenses: Somewhat hard  Food Insecurity: No Food Insecurity  . Worried About Charity fundraiser in the Last Year: Never true  . Ran Out of Food in the Last Year: Never true  Transportation Needs: No Transportation Needs  . Lack of Transportation (Medical): No  . Lack of Transportation (Non-Medical): No  Physical Activity:   . Days of Exercise per Week:   . Minutes of Exercise per Session:   Stress:   . Feeling of Stress :   Social Connections:   . Frequency of Communication with Friends and Family:   . Frequency of Social Gatherings with Friends and Family:   . Attends Religious Services:   . Active Member of Clubs or Organizations:   . Attends Archivist Meetings:   Marland Kitchen Marital Status:   Intimate Partner Violence:   . Fear of Current or Ex-Partner:   . Emotionally Abused:   Marland Kitchen Physically Abused:   . Sexually Abused:     Past Surgical History:  Procedure Laterality Date  . ABDOMINAL HYSTERECTOMY    . ANTERIOR CERVICAL DECOMP/DISCECTOMY FUSION  01-31-2009   C5 -- 7  . APPENDECTOMY    . Bone spurs Bilateral    Feet  . CARDIOVASCULAR STRESS TEST  02-26-2011   Normal lexiscan no exercise study/  no ischemia/  normal LV function and wall motion, ef 67%  . CARPAL TUNNEL RELEASE Right   . CATARACT EXTRACTION W/ INTRAOCULAR LENS  IMPLANT, BILATERAL Bilateral   . CHOLECYSTECTOMY    . COLOSTOMY N/A 06/02/2014   Procedure: DIVERTING DESCENDING END COLOSTOMY;  Surgeon: Donnie Mesa, MD;  Location: [place];  Service: General;  Laterality: N/A;  . CORONARY ANGIOPLASTY WITH STENT PLACEMENT  11-04-2007  dr bensimhon   BMS x2 to RCA/  mild Non-obstructive disease LAD/  normal LVF  . DILATION AND CURETTAGE OF  UTERUS    . ESOPHAGOGASTRODUODENOSCOPY N/A 10/28/2017   Procedure: ESOPHAGOGASTRODUODENOSCOPY (EGD);  Surgeon: Yetta Flock, MD;  [facility] ENDOSCOPY;  Service: Gastroenterology;  Laterality: N/A;  . EVALUATION UNDER ANESTHESIA WITH ANAL FISTULECTOMY N/A 10/13/2014   Procedure: ANAL EXAM UNDER ANESTHESIA ;  Surgeon: [name], MD;  Location: [facility];  Service: General;  Laterality: N/A;  . HAND SURGERY     Tendon repair  . LAPAROSCOPIC SIGMOID COLECTOMY N/A 11/24/2014  Procedure: SIGMOID COLECTOMY AND COLSOTOMY CLOSURE;  Surgeon: Donnie Mesa, MD;  Location: [place];  Service: General;  Laterality: N/A;  . LUMBAR LAMINECTOMY  10-29-2002,  1969   Left  L3 -- 4  decompression  . PARTIAL COLECTOMY N/A 06/02/2014   Procedure: RIGHT PARTIAL COLECTOMY ;  Surgeon: Donnie Mesa, MD;  Location: [place];  Service: General;  Laterality: N/A;  . PATELLECTOMY Right 09/14/2013   Procedure: PATELLECTOMY;  Surgeon: Meredith Pel, MD;  Location: [place];  Service: Orthopedics;  Laterality: Right;  . PROCTOSCOPY N/A 10/13/2014   Procedure: RIDGE PROCTOSCOPY;  Surgeon: [name], MD;  Location: [facility];  Service: General;  Laterality: N/A;  . REVISION TOTAL KNEE ARTHROPLASTY Bilateral right  04-02-2011/  left 1996 & 10-05-1999  . ROTATOR CUFF REPAIR Bilateral   . TONSILLECTOMY    . TOTAL KNEE ARTHROPLASTY Bilateral left 1994/  right 2000  . TOTAL KNEE REVISION Left 08/02/2015   Procedure: LEFT FEMORAL REVISION;  Surgeon: Gaynelle Arabian, MD;  Location: [facility];  Service: Orthopedics;  Laterality: Left;  . TRANSTHORACIC ECHOCARDIOGRAM  12-01-2009   Grade I diastolic dysfunction/  ef 60%/  moderate MR/  mild TR    Family History  Problem Relation Age of Onset  . Osteoarthritis Mother   . Heart failure Mother   . Colon cancer Maternal Uncle 68  . Heart Problems Father   . Cancer Sister 7       ? colon cancer   . Kidney cancer Brother 24  . Esophageal cancer Neg Hx   . Rectal cancer Neg Hx    . Stomach cancer Neg Hx   . Pancreatic cancer Neg Hx     No Known Allergies  Current Outpatient Medications on File Prior to Visit  Medication Sig Dispense Refill  . acetaminophen (TYLENOL) 500 MG tablet Take 1,000 mg by mouth every 6 (six) hours as needed for moderate pain.     Marland Kitchen albuterol (PROAIR HFA) 108 (90 Base) MCG/ACT inhaler Inhale 2 puffs into the lungs every 6 (six) hours as needed for wheezing or shortness of breath. 6.7 g 6  . apixaban (ELIQUIS) 5 MG TABS tablet Take 1 tablet (5 mg total) by mouth 2 (two) times daily. 180 tablet 0  . Ascorbic Acid (VITAMIN C) 1000 MG tablet Take 1,000 mg by mouth daily.    . benazepril (LOTENSIN) 40 MG tablet Take 1 tablet (40 mg total) by mouth daily. 90 tablet 1  . esomeprazole (NEXIUM) 40 MG capsule Take 1 capsule (40 mg total) by mouth daily. 90 capsule 1  . famotidine (PEPCID) 20 MG tablet Take 1 tablet (20 mg total) by mouth at bedtime. 30 tablet 3  . ferrous sulfate 325 (65 FE) MG EC tablet Take 325 mg by mouth daily with breakfast.    . fluticasone (FLOVENT HFA) 44 MCG/ACT inhaler Inhale 1 puff into the lungs in the morning and at bedtime. 1 Inhaler 3  . furosemide (LASIX) 20 MG tablet Take 2 tablets (40 mg total) by mouth daily. 180 tablet 3  . gabapentin (NEURONTIN) 600 MG tablet Take 1 tablet (600 mg total) by mouth 2 (two) times daily. 180 tablet 1  . hydrocortisone (ANUSOL-HC) 2.5 % rectal cream Place 1 application rectally 2 (two) times daily. (Patient taking differently: Place 1 application rectally 2 (two) times daily as needed. ) 30 g 1  . LORazepam (ATIVAN) 0.5 MG tablet Take 1 tablet (0.5 mg total) by mouth at bedtime. 30 tablet 2  .  nitroGLYCERIN (NITROSTAT) 0.4 MG SL tablet Place 1 tablet (0.4 mg total) under the tongue every 5 (five) minutes as needed for chest pain (x 3 pills). Reported on 09/01/2015 25 tablet 3  . sertraline (ZOLOFT) 50 MG tablet Take 1 tablet (50 mg total) by mouth daily. 90 tablet 1  . Spacer/Aero-Holding  Chambers (E-Z SPACER) inhaler Use as instructed 1 each 2  . Tiotropium Bromide Monohydrate (SPIRIVA RESPIMAT) 2.5 MCG/ACT AERS Inhale 2 puffs into the lungs daily. 4 g 3  . traZODone (DESYREL) 50 MG tablet Take 1 tablet (50 mg total) by mouth at bedtime. 90 tablet 1  . triamcinolone cream (KENALOG) 0.1 % Apply 1 application topically 2 (two) times daily.     . metoprolol tartrate (LOPRESSOR) 50 MG tablet Take 1.5 tablets (75 mg total) by mouth 2 (two) times daily. 270 tablet 1   No current facility-administered medications on file prior to visit.    BP 126/80   Temp (!) 97.5 F (36.4 C)   Wt 191 lb (86.6 kg)   BMI 32.79 kg/m       Objective:   Physical Exam Vitals and nursing note reviewed.  Constitutional:      Appearance: Normal appearance.  Cardiovascular:     Rate and Rhythm: Normal rate and regular rhythm.     Pulses: Normal pulses.     Heart sounds: Normal heart sounds.  Skin:    General: Skin is warm.     Findings: Erythema present.     Comments: Redness, warmth, edema, and pain noted on the left middle finger from distal tip to MIP joint.  She is unable to bend at the DIP joint  Neurological:     General: No focal deficit present.     Mental Status: She is alert and oriented to person, place, and time.       Assessment & Plan:  1. Cellulitis of left ring finger -No paronychia noted Gout versus cellulitis.  Appears more cellulitic and will treat with doxycycline.  Will also cover for gout with prednisone and advised to follow-up in 2 to 3 days if no improvement - doxycycline (VIBRAMYCIN) 100 MG capsule; Take 1 capsule (100 mg total) by mouth 2 (two) times daily.  Dispense: 14 capsule; Refill: 0 - predniSONE (DELTASONE) 20 MG tablet; Take 1 tablet (20 mg total) by mouth daily with breakfast.  Dispense: 7 tablet; Refill: 0   [name], NP

## 2020-02-12 NOTE — Chronic Care Management (AMB) (Signed)
[place] for an update for Eliquis patient assistance program application.  Patient approved:     01-28-2020 through 07-14-2020. Medication was delivered on Tuesday 02-01-2020 and patient will receive 3 bottles every three months until the end of the year. Patient will then need need to reapply

## 2020-02-12 NOTE — Chronic Care Management (AMB) (Signed)
Care coordination for medications delivery with [facility] for Flovent and famotidine prescriptions (new medications).   [place] to assess progress of Eliquis patient assistance application. Patient's application was received and will be reviewed within 48-72 hours. Called patient to relay information. Patient stated she had a couple of days left and copay for Eliquis refill is too expensive for her and requested samples. Samples not available.   Called cardiologist office, (Dr. Acie Fredrickson) to request Eliquis samples. 14 day supply of Eliquis samples delivered to pharmacy to be deliver with medications coordinated to be delivered on 01/28/2020. Called patient to advised on Eliquis samples.

## 2020-02-16 ENCOUNTER — Telehealth: Payer: Self-pay

## 2020-02-16 NOTE — Progress Notes (Addendum)
Chronic Care Management Pharmacy Assistant   Name: Amberly Livas Zee  MRN: [id number] DOB: 08/27/1929  Reason for Encounter: Medication Review   PCP : [name], NP  Allergies:  No Known Allergies  Medications: Outpatient Encounter Medications as of 02/16/2020  Medication Sig  . acetaminophen (TYLENOL) 500 MG tablet Take 1,000 mg by mouth every 6 (six) hours as needed for moderate pain.   Marland Kitchen albuterol (PROAIR HFA) 108 (90 Base) MCG/ACT inhaler Inhale 2 puffs into the lungs every 6 (six) hours as needed for wheezing or shortness of breath.  Marland Kitchen apixaban (ELIQUIS) 5 MG TABS tablet Take 1 tablet (5 mg total) by mouth 2 (two) times daily.  . Ascorbic Acid (VITAMIN C) 1000 MG tablet Take 1,000 mg by mouth daily.  . benazepril (LOTENSIN) 40 MG tablet Take 1 tablet (40 mg total) by mouth daily.  Marland Kitchen doxycycline (VIBRAMYCIN) 100 MG capsule Take 1 capsule (100 mg total) by mouth 2 (two) times daily.  Marland Kitchen esomeprazole (NEXIUM) 40 MG capsule Take 1 capsule (40 mg total) by mouth daily.  . famotidine (PEPCID) 20 MG tablet Take 1 tablet (20 mg total) by mouth at bedtime.  . ferrous sulfate 325 (65 FE) MG EC tablet Take 325 mg by mouth daily with breakfast.  . fluticasone (FLOVENT HFA) 44 MCG/ACT inhaler Inhale 1 puff into the lungs in the morning and at bedtime.  . furosemide (LASIX) 20 MG tablet Take 2 tablets (40 mg total) by mouth daily.  Marland Kitchen gabapentin (NEURONTIN) 600 MG tablet Take 1 tablet (600 mg total) by mouth 2 (two) times daily.  . hydrocortisone (ANUSOL-HC) 2.5 % rectal cream Place 1 application rectally 2 (two) times daily. (Patient taking differently: Place 1 application rectally 2 (two) times daily as needed. )  . LORazepam (ATIVAN) 0.5 MG tablet Take 1 tablet (0.5 mg total) by mouth at bedtime.  . metoprolol tartrate (LOPRESSOR) 50 MG tablet Take 1.5 tablets (75 mg total) by mouth 2 (two) times daily.  . nitroGLYCERIN (NITROSTAT) 0.4 MG SL tablet Place 1 tablet (0.4 mg total) under the  tongue every 5 (five) minutes as needed for chest pain (x 3 pills). Reported on 09/01/2015  . predniSONE (DELTASONE) 20 MG tablet Take 1 tablet (20 mg total) by mouth daily with breakfast.  . sertraline (ZOLOFT) 50 MG tablet Take 1 tablet (50 mg total) by mouth daily.  Marland Kitchen Spacer/Aero-Holding Chambers (E-Z SPACER) inhaler Use as instructed  . Tiotropium Bromide Monohydrate (SPIRIVA RESPIMAT) 2.5 MCG/ACT AERS Inhale 2 puffs into the lungs daily.  . traZODone (DESYREL) 50 MG tablet Take 1 tablet (50 mg total) by mouth at bedtime.  . triamcinolone cream (KENALOG) 0.1 % Apply 1 application topically 2 (two) times daily.    No facility-administered encounter medications on file as of 02/16/2020.    Current Diagnosis: Patient Active Problem List   Diagnosis Date Noted  . Mediastinal adenopathy 01/21/2020  . Cellulitis 08/03/2019  . CKD (chronic kidney disease), stage III 08/03/2019  . Centrilobular emphysema ([place]) 04/06/2019  . Gastric ulcer   . Thrombocytopenia ([place]) 10/27/2017  . Abnormal stress test 10/27/2017  . Moderate mitral regurgitation 10/27/2017  . Acute on chronic diastolic CHF (congestive heart failure) ([place]) 10/27/2017  . CAP (community acquired pneumonia) 10/26/2017  . Displaced fracture of lateral malleolus of right fibula, subsequent encounter for closed fracture with routine healing 12/27/2016  . Iron deficiency anemia 11/12/2016  . Chronic anticoagulation 11/12/2016  . Chronic atrial fibrillation ([place]) 08/05/2016  . Coronary artery disease  of native artery of native heart with stable angina pectoris ([place]) 08/05/2016  . Failed total left knee replacement ([place]) 08/02/2015  . Sigmoid diverticulitis 11/24/2014  . Neuropathic pain 06/13/2014  . Insomnia 06/13/2014  . Anxiety and depression 06/13/2014  . Cancer of ascending colon ([place]) 06/13/2014  . Colovaginal fistula 04/20/2014  . Lethargy 09/15/2013  . Chest pain, musculoskeletal 02/19/2011  . Depression 02/07/2010  .  DIASTOLIC HEART FAILURE, CHRONIC 02/27/2009  . Pure hypercholesterolemia 04/05/2008  . Coronary atherosclerosis 04/05/2008  . Osteoarthritis 05/19/2007  . Essential hypertension 01/21/2007  . GERD 01/21/2007  . Hiatal hernia 01/21/2007  . Diverticulosis of large intestine 01/21/2007  . COLONIC POLYPS, HX OF 01/21/2007    Goals Addressed   None     Reviewed chart for medication changes ahead of medication coordination call.  02/02/2020- [name], NP- Patient prescribed doxycycline 100mg  for 14 days and prednisone 20mg  for 7days.   No other medication changes indicated.  BP Readings from Last 3 Encounters:  02/02/20 126/80  01/21/20 128/72  12/27/19 139/83    Lab Results  Component Value Date   HGBA1C 5.9 (H) 05/31/2014     Patient obtains medications through [facility]  90 Days this coordination the patient will receive a 30 day supply  Last adherence delivery included:  Gabapentin 600mg ; twice daily Trazodone 50mg : one tab at bedtime Sertraline 50mg ; one tab daily Metoprolol Tart 50mg ; one and a half tabs twice a day Esomepra Mag 40mg ; one cap once a day Benazepril 40mg ; once tab daily Famotidine 20mg ; one tab every night Spiriva Spr 2.5mg : inhale two puffs twice a day Flovent Hfa Aer 49mcg; inhale one puff every morning and at bedtime Lorazepam 0.5mg ; one tablet twice a day   Patient is due for next adherence delivery on: 02-25-2020. Called patient and reviewed medications and coordinated delivery.  This delivery to include: Patient will need a 30 day supply of  Spiriva Spr 2.5mg ,  Flovent Hfa Aer 28mcg,  Lorazepam 0.5mg ,  Triamcinolone Acetonide 0.1% topical cream.   Patient declined the following medications: Gabapentin 600mg ; twice daily Trazodone 50mg : one tab at bedtime Sertraline 50mg ; one tab daily Metoprolol Tart 50mg ; one and a half tabs twice a day Esomepra Mag 40mg ; one cap once a day Benazepril 40mg ; once tab daily Famotidine 20mg ; one tab  every night due to having enough on hand from previous 90 day delivery.  Patient does not need refills at this time.  Confirmed delivery date of 02-25-2020 advised patient that pharmacy will contact them the morning of delivery.  Fanny Skates, [place] Clinical pharmacist assistant [phone]

## 2020-02-21 NOTE — Progress Notes (Signed)
    Chronic Care Management   Outreach Note  03/14/2020 Name: [name] MRN: [id number] DOB: [birth year]  Referred by: [name], NP Reason for referral : Chronic Care Management   A second unsuccessful telephone outreach was attempted today. The patient was referred to pharmacist for assistance with care management and care coordination.

## 2020-03-05 ENCOUNTER — Encounter: Payer: Self-pay | Admitting: Adult Health

## 2020-03-07 ENCOUNTER — Other Ambulatory Visit: Payer: Self-pay | Admitting: Adult Health

## 2020-03-07 DIAGNOSIS — L03012 Cellulitis of left finger: Secondary | ICD-10-CM

## 2020-03-07 MED ORDER — PREDNISONE 20 MG PO TABS: 20.0000 mg | ORAL_TABLET | Freq: Every day | ORAL | 0 refills | Status: DC

## 2020-03-21 ENCOUNTER — Telehealth: Payer: Self-pay | Admitting: Adult Health

## 2020-03-21 DIAGNOSIS — I10 Essential (primary) hypertension: Secondary | ICD-10-CM

## 2020-03-21 DIAGNOSIS — E7800 Pure hypercholesterolemia, unspecified: Secondary | ICD-10-CM

## 2020-03-21 NOTE — Progress Notes (Addendum)
Chronic Care Management Pharmacy Assistant   Name: [name]  MRN: [id number] DOB: [birth year]  Reason for Encounter: Medication Review  Patient Questions:  1.  Have you seen any other providers since your last visit? No  2.  Any changes in your medicines or health? No  PCP : [name], NP  Allergies:  No Known Allergies  Medications: Outpatient Encounter Medications as of 03/21/2020  Medication Sig  . acetaminophen (TYLENOL) 500 MG tablet Take 1,000 mg by mouth every 6 (six) hours as needed for moderate pain.   Marland Kitchen albuterol (PROAIR HFA) 108 (90 Base) MCG/ACT inhaler Inhale 2 puffs into the lungs every 6 (six) hours as needed for wheezing or shortness of breath.  Marland Kitchen apixaban (ELIQUIS) 5 MG TABS tablet Take 1 tablet (5 mg total) by mouth 2 (two) times daily.  . Ascorbic Acid (VITAMIN C) 1000 MG tablet Take 1,000 mg by mouth daily.  . benazepril (LOTENSIN) 40 MG tablet Take 1 tablet (40 mg total) by mouth daily.  Marland Kitchen esomeprazole (NEXIUM) 40 MG capsule Take 1 capsule (40 mg total) by mouth daily.  . famotidine (PEPCID) 20 MG tablet Take 1 tablet (20 mg total) by mouth at bedtime.  . ferrous sulfate 325 (65 FE) MG EC tablet Take 325 mg by mouth daily with breakfast.  . fluticasone (FLOVENT HFA) 44 MCG/ACT inhaler Inhale 1 puff into the lungs in the morning and at bedtime.  . furosemide (LASIX) 20 MG tablet Take 2 tablets (40 mg total) by mouth daily.  Marland Kitchen gabapentin (NEURONTIN) 600 MG tablet Take 1 tablet (600 mg total) by mouth 2 (two) times daily.  . hydrocortisone (ANUSOL-HC) 2.5 % rectal cream Place 1 application rectally 2 (two) times daily. (Patient taking differently: Place 1 application rectally 2 (two) times daily as needed. )  . LORazepam (ATIVAN) 0.5 MG tablet Take 1 tablet (0.5 mg total) by mouth at bedtime.  . metoprolol tartrate (LOPRESSOR) 50 MG tablet Take 1.5 tablets (75 mg total) by mouth 2 (two) times daily.  . nitroGLYCERIN (NITROSTAT) 0.4 MG SL tablet Place 1  tablet (0.4 mg total) under the tongue every 5 (five) minutes as needed for chest pain (x 3 pills). Reported on 09/01/2015  . predniSONE (DELTASONE) 20 MG tablet Take 1 tablet (20 mg total) by mouth daily with breakfast.  . sertraline (ZOLOFT) 50 MG tablet Take 1 tablet (50 mg total) by mouth daily.  Marland Kitchen Spacer/Aero-Holding Chambers (E-Z SPACER) inhaler Use as instructed  . Tiotropium Bromide Monohydrate (SPIRIVA RESPIMAT) 2.5 MCG/ACT AERS Inhale 2 puffs into the lungs daily.  . traZODone (DESYREL) 50 MG tablet Take 1 tablet (50 mg total) by mouth at bedtime.  . triamcinolone cream (KENALOG) 0.1 % Apply 1 application topically 2 (two) times daily.    No facility-administered encounter medications on file as of 03/21/2020.    Current Diagnosis: Patient Active Problem List   Diagnosis Date Noted  . Mediastinal adenopathy 01/21/2020  . Cellulitis 08/03/2019  . CKD (chronic kidney disease), stage III 08/03/2019  . Centrilobular emphysema ([place]) 04/06/2019  . Gastric ulcer   . Thrombocytopenia ([place]) 10/27/2017  . Abnormal stress test 10/27/2017  . Moderate mitral regurgitation 10/27/2017  . Acute on chronic diastolic CHF (congestive heart failure) ([place]) 10/27/2017  . CAP (community acquired pneumonia) 10/26/2017  . Displaced fracture of lateral malleolus of right fibula, subsequent encounter for closed fracture with routine healing 12/27/2016  . Iron deficiency anemia 11/12/2016  . Chronic anticoagulation 11/12/2016  . Chronic atrial  fibrillation ([place]) 08/05/2016  . Coronary artery disease of native artery of native heart with stable angina pectoris ([place]) 08/05/2016  . Failed total left knee replacement ([place]) 08/02/2015  . Sigmoid diverticulitis 11/24/2014  . Neuropathic pain 06/13/2014  . Insomnia 06/13/2014  . Anxiety and depression 06/13/2014  . Cancer of ascending colon ([place]) 06/13/2014  . Colovaginal fistula 04/20/2014  . Lethargy 09/15/2013  . Chest pain, musculoskeletal 02/19/2011    . Depression 02/07/2010  . DIASTOLIC HEART FAILURE, CHRONIC 02/27/2009  . Pure hypercholesterolemia 04/05/2008  . Coronary atherosclerosis 04/05/2008  . Osteoarthritis 05/19/2007  . Essential hypertension 01/21/2007  . GERD 01/21/2007  . Hiatal hernia 01/21/2007  . Diverticulosis of large intestine 01/21/2007  . COLONIC POLYPS, HX OF 01/21/2007    Goals Addressed   None    Reviewed chart for medication changes ahead of medication coordination call.  8/24/21[name], NP - Patient prescribed prednisone 20 mg for 7 days for gout in finger.   No medication changes indicated.  BP Readings from Last 3 Encounters:  02/02/20 126/80  01/21/20 128/72  12/27/19 139/83    Lab Results  Component Value Date   HGBA1C 5.9 (H) 05/31/2014     Patient obtains medications through [facility]  90 Days for this coordination the patient will receive a 30 day supply  Last adherence delivery included:  o Gabapentin 600mg ; twice daily o Trazodone 50mg : one tab at bedtime o Sertraline 50mg ; one tab daily o Metoprolol Tart 50mg ; one and a half tabs twice a day o Esomepra Mag 40mg ; one cap once a day o Benazepril 40mg ; once tab daily o Famotidine 20mg ; one tab every night o Spiriva Spr 2.5mg : inhale two puffs twice a day o Flovent Hfa Aer 74mcg; inhale one puff every morning and at bedtime o Lorazepam 0.5mg ; one tablet twice a day o Triamcinolone Acetonide 0.1% topical cream.  Patient is due for next adherence delivery on: 03/24/2020. Called patient and reviewed medications and coordinated delivery.  This delivery to include: Spiriva Spr 2.5mg : inhale two puffs twice a day Lorazepam 0.5mg ; one tablet twice a day  Patient declined the following medication: Flovent Hfa Aer 44 mcg due to supply on hand  Patient does need refills at this time.  Confirmed delivery date of 03-24-2020, advised patient that pharmacy will contact them the morning of delivery.  Follow-Up:  Coordination of  Enhanced Pharmacy Services   Amilia [place]mal[place]) Mare Ferrari, [place] Assistant [phone]  Patient reported not taking Flovent twice daily as it made her shake too much and therefore does not need a refill at this time. Will send message to pulmonologist to switch medications in order to avoid non-adherence and improve lung functioning.  Jeni Salles, PharmD Clinical Pharmacist [facility] [place]

## 2020-03-23 ENCOUNTER — Other Ambulatory Visit: Payer: Self-pay

## 2020-03-23 ENCOUNTER — Ambulatory Visit (INDEPENDENT_AMBULATORY_CARE_PROVIDER_SITE_OTHER)
Admission: RE | Admit: 2020-03-23 | Discharge: 2020-03-23 | Disposition: A | Discharge status: Home / Self Care | Payer: HMO | Source: Ambulatory Visit | Attending: Primary Care | Admitting: Primary Care

## 2020-03-23 DIAGNOSIS — R590 Localized enlarged lymph nodes: Secondary | ICD-10-CM | POA: Diagnosis not present

## 2020-03-23 DIAGNOSIS — Z85038 Personal history of other malignant neoplasm of large intestine: Secondary | ICD-10-CM | POA: Diagnosis not present

## 2020-03-23 DIAGNOSIS — R0609 Other forms of dyspnea: Secondary | ICD-10-CM | POA: Diagnosis not present

## 2020-03-23 DIAGNOSIS — I712 Thoracic aortic aneurysm, without rupture: Secondary | ICD-10-CM | POA: Diagnosis not present

## 2020-03-23 DIAGNOSIS — J439 Emphysema, unspecified: Secondary | ICD-10-CM | POA: Diagnosis not present

## 2020-03-29 ENCOUNTER — Ambulatory Visit: Payer: HMO

## 2020-03-29 DIAGNOSIS — I10 Essential (primary) hypertension: Secondary | ICD-10-CM

## 2020-03-29 DIAGNOSIS — E7800 Pure hypercholesterolemia, unspecified: Secondary | ICD-10-CM

## 2020-03-29 NOTE — Chronic Care Management (AMB) (Signed)
Chronic Care Management Pharmacy  Name: Latoshia Monrroy Kirn  MRN: [id number] DOB: [birth year]   Initial Questions: 1. Have you seen any other providers since your last visit? Yes  2. Any changes in your medicines or health? No   Chief Complaint/ HPI  Devani Odonnel Lacivita,  86 y.o. , female presents for their Follow-Up CCM visit with the clinical pharmacist via telephone due to COVID-19 Pandemic.  PCP : [name], NP  Their chronic conditions include: diastolic HF, HTN, chronic afib, HLD/ coronary atherosclerosis, COPD, GERD, osteoarthritis, CKD stage III, neuropathic pain, insomnia,  anxiety/ depression, gout, iron deficiency anemia, cellulitis   Office Visits: 02/02/20  [name], NP- Patient presented for office visit for cellulitis of left ring finger for the past 2 weeks. Appears to be more cellulitic than gouty. Prescribed doxycycline 7 day course for cellulitis and prednisone for gout.  11/03/2019- [name], NP- Patient presented for office visit for concern of cellulitis of right leg. Appears venous stasis or venous stasis dermatitis. Patient referred to vascular surgery for further evaluation.   03/232021- Patient presented for office visit with [name], NP for decreased urination and red itchy rash on torso. For decreased urination, BMP and urinalysis ordered. For rash, advised to use hydrocortisone cream twice daily as needed. Unlikely rash is due to allopurinol as patient took in the past.   08/13/2019- Patient presented for office visit with [name], NP for TCM visit (hospital admission due to cellulitis of right leg. She was discharged on Omnicef 300mg  twice daily for 5 days, prednisone taper, and hydrocodone for pain. Patient to continue [place] for additional 5 days for complete resolution.   07/30/2019- Patient presented for office visit with Dr. Carolann Littler, MD for cellulitis of right leg (follow up from 07/28/2019). Patient to continue Keflex 500mg  four times  daily and to continue heating pad on low heat.   07/28/2019- Patient presented for office visit with Dr. Carolann Littler, MD for cellulitis of right leg. Patient completed doxycycline with no improvement. Antibiotic changed to Keflex 500mg  four times daily x 10 days for strep coverage. Patient also prescribed hydrocodone/ APAP 5/325mg , 1 tablet every 6 hours as needed for severe pain. Patient to try heating pad on low heat several times daily.   Consult Visit: 01/21/20 - Pulmonary disease - Geraldo Pitter, NP 12/27/2019- Vascular surgery- Harold Barban, MD- Patient presented for office visit for venous stasis evaluation. Patient not a candidate for laser venous ablation. Patient to continue leg elevation and wear compression stockings (20-30).  12/09/2019- Cardiology- Mertie Moores, MD- Patient presented for office visit for follow up for afib, CAD, CHF, anemia.  Patient presented stable. Patient to obtain BMET. Patient advised leg elevation, salt restrition, compression hose, walk regularly.   07/30/2019- Gastroenterology- Patient presented to Dr. Scarlette Shorts, MD for internal hemorrhoids. Patient presented to ER and doctor recommended GI doctor follow up since rectal bleeding Wion be to hemorrhoids. Hgb: 13.3 (07/21/2019). Patient reported no further bleeding. Patient to take Metamucil 2 tablespoons daily and Anusol suppositories at night. Patient to follow up as needed.   Medications: Outpatient Encounter Medications as of 03/29/2020  Medication Sig  . acetaminophen (TYLENOL) 500 MG tablet Take 1,000 mg by mouth every 6 (six) hours as needed for moderate pain.   Marland Kitchen albuterol (PROAIR HFA) 108 (90 Base) MCG/ACT inhaler Inhale 2 puffs into the lungs every 6 (six) hours as needed for wheezing or shortness of breath.  Marland Kitchen apixaban (ELIQUIS) 5 MG TABS tablet Take 1 tablet (  5 mg total) by mouth 2 (two) times daily.  . Ascorbic Acid (VITAMIN C) 1000 MG tablet Take 1,000 mg by mouth daily.  . benazepril (LOTENSIN) 40  MG tablet Take 1 tablet (40 mg total) by mouth daily.  Marland Kitchen esomeprazole (NEXIUM) 40 MG capsule Take 1 capsule (40 mg total) by mouth daily.  . famotidine (PEPCID) 20 MG tablet Take 1 tablet (20 mg total) by mouth at bedtime.  . ferrous sulfate 325 (65 FE) MG EC tablet Take 325 mg by mouth daily with breakfast.  . fluticasone (FLOVENT HFA) 44 MCG/ACT inhaler Inhale 1 puff into the lungs in the morning and at bedtime.  . furosemide (LASIX) 20 MG tablet Take 2 tablets (40 mg total) by mouth daily.  Marland Kitchen gabapentin (NEURONTIN) 600 MG tablet Take 1 tablet (600 mg total) by mouth 2 (two) times daily.  . hydrocortisone (ANUSOL-HC) 2.5 % rectal cream Place 1 application rectally 2 (two) times daily. (Patient taking differently: Place 1 application rectally 2 (two) times daily as needed. )  . LORazepam (ATIVAN) 0.5 MG tablet Take 1 tablet (0.5 mg total) by mouth at bedtime.  . metoprolol tartrate (LOPRESSOR) 50 MG tablet Take 1.5 tablets (75 mg total) by mouth 2 (two) times daily.  . nitroGLYCERIN (NITROSTAT) 0.4 MG SL tablet Place 1 tablet (0.4 mg total) under the tongue every 5 (five) minutes as needed for chest pain (x 3 pills). Reported on 09/01/2015  . predniSONE (DELTASONE) 20 MG tablet Take 1 tablet (20 mg total) by mouth daily with breakfast.  . sertraline (ZOLOFT) 50 MG tablet Take 1 tablet (50 mg total) by mouth daily.  Marland Kitchen Spacer/Aero-Holding Chambers (E-Z SPACER) inhaler Use as instructed  . Tiotropium Bromide Monohydrate (SPIRIVA RESPIMAT) 2.5 MCG/ACT AERS Inhale 2 puffs into the lungs daily.  . traZODone (DESYREL) 50 MG tablet Take 1 tablet (50 mg total) by mouth at bedtime.  . triamcinolone cream (KENALOG) 0.1 % Apply 1 application topically 2 (two) times daily.    No facility-administered encounter medications on file as of 03/29/2020.    Current Diagnosis/Assessment:  Goals Addressed            This Visit's Progress   . Pharmacy Care Plan       CARE PLAN ENTRY (see longitudinal plan of  care for additional care plan information)  Current Barriers:  . Chronic Disease Management support, education, and care coordination needs related to Hypertension, Hyperlipidemia, Atrial Fibrillation, Heart Failure, and Pulmonary emphysema    Atrial fibrillation (Afib) . Pharmacist Clinical Goal(s) o Over the next 90 days, patient will work with PharmD and providers to maintain normal sinus rhythm.  . Current regimen:  o metoprolol tartrate 50mg , 1.5 (one and one-half) tablets twice daily o apixaban (Eliquis) 5mg  1 tablet twice daily  . Interventions: o We discussed:  monitoring for signs and symptoms for bleeding (coughing up blood, prolonged nose bleeds, black, tarry stools). . Patient self care activities o Patient will continue current medications as prescribed.   Diastolic heart failure . Pharmacist Clinical Goal(s) o Over the next 90 days, patient will work with PharmD and providers to maintain weight stable and check weight daily.  . Current regimen:   benazepril 40mg , 1 tablet once daily   metoprolol tartrate 50mg , 1.5 (one and one-half) tablets twice daily  furosemide 40mg , 1 tablet once daily . Interventions: o We discussed weighing daily; if you gain more than 3 pounds in one day or 5 pounds in one week call your doctor .  Patient self care activities o Patient will take medications as prescribed and monitor weight.   Hypertension BP Readings from Last 3 Encounters:  01/21/20 128/72  12/27/19 139/83  12/09/19 124/74   . Pharmacist Clinical Goal(s): o Over the next 90 days, patient will work with PharmD and providers to maintain BP goal <130/80 . Current regimen:   benazepril 40mg , 1 tablet once daily  metoprolol tartrate 50mg , 1.5 (one and one-half) tablets twice daily . Patient self care activities - Over the next 60 days, patient will: o Check blood pressure daily, document, and provide at future appointments o Ensure daily salt intake < 2300  mg/day o Check blood pressure when feeling lightheaded or dizzy.  Hyperlipidemia Lab Results  Component Value Date/Time   LDLCALC 79 01/19/2013 10:14 AM   . Pharmacist Clinical Goal(s): o Over the next 90 days, patient will work with PharmD and providers to achieve LDL goal < 70 . Current regimen:  o No medications.  . Interventions: o Overdue for lipid panel (cholesterol blood work) . Patient self care activities - Over the next 60 days, patient will: o Obtain cholesterol blood work.  COPD . Pharmacist Clinical Goal(s) o Over the next 90 days, patient will work with PharmD and providers to prevent worsening of shortness of breath and hospitalizations. . Current regimen:   tiotropium (Spiriva Respimat) 2.5 mcg/ act, inhale 2 puffs into lungs daily   albuterol (Proair HFA) 108 mcg/act inhaler, 2 puffs every six hours as needed for wheezing or shortness of breath   Flovent HFA 44 mcg, inhale 1 puff into the lungs twice daily . Patient self care activities - Over the next 90 days, patient will: o Contact pulmonologist office and schedule office visit.   Medication management . Pharmacist Clinical Goal(s): o Over the next 90 days, patient will work with PharmD and providers to maintain optimal medication adherence . Current pharmacy: [facility] . Interventions o Comprehensive medication review performed. o Utilize [facility] pharmacy for medication synchronization, packaging and delivery . Patient self care activities - Over the next 90 days, patient will: o Take medications as prescribed o Report any questions or concerns to PharmD and/or provider(s)  Please see past updates related to this goal by clicking on the "Past Updates" button in the selected goal           SDOH Interventions     Most Recent Value  SDOH Interventions  Financial Strain Interventions Other (Comment)  [working on patient assistance for inhalers]  Transportation Interventions Intervention Not  Indicated      AFIB  Patient is currently rate controlled. HR: 70s BPM   Patient has failed these meds in past:   Patient is currently controlled on the following medications:   metoprolol tartrate 50mg , 1.5 (one and one-half) tablets twice daily   Anticoagulation  apixaban (Eliquis) 5mg  1 tablet twice daily  We discussed:  monitoring for signs and symptoms for bleeding (coughing up blood, prolonged nose bleeds, black, tarry stools).  Plan Continue current medications  Eliquis PAP approved until Dec 31st, 2021 - will plan to renew.   Heart Failure  Type: Diastolic  Last ejection fraction: 55-60% (10/13/2017)   Patient has failed these meds in past: spironolactone   Patient is currently controlled on the following medications:   benazepril 40mg , 1 tablet once daily   metoprolol tartrate 50mg , 1.5 (one and one-half) tablets twice daily  furosemide 40mg , 1 tablet once daily  We discussed weighing daily; if you gain more than  3 pounds in one day or 5 pounds in one week call your doctor- patient reports not weighing every day (at most every couple of days).   Plan Continue current medications.   Hypertension  Denies persistent dizziness/ lightheadedness/ orthostatic hypotension.  BP today is:  <130/80  Office blood pressures are  BP Readings from Last 3 Encounters:  02/02/20 126/80  01/21/20 128/72  12/27/19 139/83   Patient has failed these meds in the past: diltiazem  Patient checks BP at home 3-5x per week  Patient home BP readings are ranging: 110-120s/60-70s HR: 70s.   Patient is controlled on:   benazepril 40mg , 1 tablet once daily  metoprolol tartrate 50mg , 1.5 (one and one-half) tablets twice daily  Plan Continue current medications.  Patient will check BP when lightheaded/dizzy.   Hyperlipidemia/ coronary atherosclerosis  LDL > 70   Lipid Panel     Component Value Date/Time   CHOL 130 01/19/2013 1014   TRIG 87.0 01/19/2013 1014   HDL  33.60 (L) 01/19/2013 1014   CHOLHDL 4 01/19/2013 1014   VLDL 17.4 01/19/2013 1014   LDLCALC 79 01/19/2013 1014    The ASCVD Risk score (Goff DC Jr., et al., 2013) failed to calculate for the following reasons:   The 2013 ASCVD risk score is only valid for ages 1 to 52  Patient has failed these meds in past: simvastatin (myalgia)   Patient is currently controlled on the following medications:  nitroglycerin 0.4mg , 1 tablet under tongue every five minutes as needed for chest pain (x 3 pills) (last dose taken January 2021).   Plan Patient overdue for lipid panel. Recommend statin with history of CAD.   COPD  Last spirometry score: none on file   Eosinophil count:   Lab Results  Component Value Date/Time   EOSPCT 4 08/06/2019 05:26 AM   EOSPCT 1.4 02/17/2017 09:32 AM  %                               Eos (Absolute):  Lab Results  Component Value Date/Time   EOSABS 0.3 08/06/2019 05:26 AM   EOSABS 0.1 02/17/2017 09:32 AM   Tobacco Status:  Social History   Tobacco Use  Smoking Status Former Smoker  . Packs/day: 0.50  . Years: 16.00  . Pack years: 8.00  . Types: Cigarettes  . Start date: 75  . Quit date: 07/16/1967  . Years since quitting: 52.7  Smokeless Tobacco Never Used   Patient has failed these meds in past: none  Flovent (per patient not using)   Patient is currently controlled on the following medications:   tiotropium (Spiriva Respimat) 2.5 mcg/ act, inhale 2 puffs into lungs daily   albuterol (Proair HFA) 108 mcg/act inhaler, 2 puffs every six hours as needed for wheezing or shortness of breath   Flovent 44 mcg  Inhale 1 puff twice daily  Using maintenance inhaler regularly? Yes Frequency of rescue inhaler use:  2 times per day    We discussed:  proper inhaler technique  - when discussing Flovent, patient stated she only uses one puff per day because two puffs make her anxious/jittery. Recommended for patient to contact pulmonologist to make her aware  and Cristina need to switch inhalers.     Plan Follow up with pulmonology in Oct/Nov.  Osteoarthritis   Patient has failed these meds in past: none   Patient is currently controlled on the following medications:  acetaminophen (Tylenol) 500mg , 2 tablets every six hours as needed for moderate pain (patient reports taking at most once to twice daily)  Plan Continue current medications  Gout   Patient has failed these meds in past: allopurinol (not taking due to rash)  Patient is currently controlled on the following medications:   colchicine 0.6mg  as needed (last flare up was in January while hospitalized)  - Patient reports dermatologist does not believe allopurinol caused the rash. -Patient has had 2 gout flare ups recently.   Plan Recommend restarting allopurinol if it did not cause rash. Will request PRN colchicine for patient to have on hand for future flare ups.   GERD   Patient is currently controlled on the following medications:   esomeprazole 40mg , 1 capsule once daily   We discussed:  non-pharmacological interventions for acid reflux. Take measures to prevent acid reflux, such as avoiding spicy foods, avoiding caffeine, avoid laying down a few hours after eating, and raising the head of the bed.  Plan Continue current medications  Consider taper plan if patient does not need.  Iron deficiency anemia    Hemoglobin & Hematocrit     Component Value Date/Time   HGB 12.8 09/06/2019 0000   HGB 11.7 (L) 07/21/2019 1031   HGB 11.7 08/04/2017 1610   HGB 11.7 02/17/2017 0932   HCT 40 09/06/2019 0000   HCT 37.4 08/04/2017 1610   HCT 39.0 02/17/2017 0932   Iron/TIBC/Ferritin/ %Sat    Component Value Date/Time   IRON 129 01/19/2018 0942   IRON 43 06/17/2016 1253   TIBC 290 01/19/2018 0942   TIBC 400 06/17/2016 1253   FERRITIN 101 01/19/2018 0942   FERRITIN 23 06/17/2016 1253   IRONPCTSAT 45 01/19/2018 0942   IRONPCTSAT 11 (L) 06/17/2016 1253   Patient is  currently controlled on the following medications:   ferrous sulfate 325mg  1 tablet once daily with breakfast    Plan Continue current medications  Neuropathic pain   Patient is currently controlled on the following medications:   gabapentin 600mg , 1 tablet twice daily   Plan Continue current medications.    Anxiety/ depression  Patient is currently controlled on the following medications:   sertraline 50mg , 1 tablet once daily   Plan Continue current medications  Insomnia  Patient is currently controlled on the following medications:   trazodone 50mg , 1 tablet once daily at bedtime   lorazepam 0.5mg , 1 tablet twice daily as needed for anxiety or sleep (patient reports taking only at bedtime)   Plan Continue current medications.     CKD, Stage 3   Kidney Function Lab Results  Component Value Date/Time   CREATININE 1.38 (H) 12/09/2019 12:15 PM   CREATININE 1.53 (H) 10/05/2019 10:12 AM   CREATININE 1.3 (H) 02/17/2017 09:32 AM   CREATININE 1.5 (H) 09/10/2016 08:54 AM   GFR 32.06 (L) 10/05/2019 10:12 AM   GFRNONAA 34 (L) 12/09/2019 12:15 PM   GFRAA 40 (L) 12/09/2019 12:15 PM   K 4.5 12/09/2019 12:15 PM   K 4.4 10/05/2019 10:12 AM   K 4.6 02/17/2017 09:32 AM   K 5.4 (H) 09/10/2016 08:54 AM   Kidney function improved.   Plan Continue to monitor and adjust medications as needed.    Medication Management  Patient organizes medications: patient uses adherence packagining Barriers: not taking Flovent as prescribed due to side effects Primary pharmacy: [facility]   Follow up Follow up visit with PharmD in 3 months.     Jeni Salles, PharmD Clinical  Pharmacist [facility] [place] [phone]

## 2020-03-30 NOTE — Progress Notes (Signed)
Please let patient know CT chest showed Stable emphysema, 23mm lung nodule unchanged. Incidental findings included stable atherosclerosis coronary artery and she had dilated aortic arch recommend she follow up with PCP- she will need annual imaging to follow this.

## 2020-04-04 ENCOUNTER — Other Ambulatory Visit: Payer: Self-pay | Admitting: Adult Health

## 2020-04-04 MED ORDER — ALLOPURINOL 100 MG PO TABS
100.0000 mg | ORAL_TABLET | Freq: Every day | ORAL | 1 refills | Status: DC
Start: 2020-04-04 — End: 2020-07-11

## 2020-04-06 ENCOUNTER — Encounter: Payer: Self-pay | Admitting: Adult Health

## 2020-04-10 ENCOUNTER — Encounter: Payer: Self-pay | Admitting: Adult Health

## 2020-04-11 ENCOUNTER — Other Ambulatory Visit: Payer: Self-pay | Admitting: Adult Health

## 2020-04-11 ENCOUNTER — Other Ambulatory Visit: Payer: Self-pay

## 2020-04-11 ENCOUNTER — Telehealth (INDEPENDENT_AMBULATORY_CARE_PROVIDER_SITE_OTHER): Payer: HMO | Admitting: Adult Health

## 2020-04-11 ENCOUNTER — Encounter: Payer: Self-pay | Admitting: Adult Health

## 2020-04-11 VITALS — BP 119/75 | Temp 98.3°F | Wt 180.0 lb

## 2020-04-11 DIAGNOSIS — R05 Cough: Secondary | ICD-10-CM | POA: Diagnosis not present

## 2020-04-11 DIAGNOSIS — R059 Cough, unspecified: Secondary | ICD-10-CM

## 2020-04-11 MED ORDER — PREDNISONE 10 MG PO TABS: ORAL_TABLET | ORAL | 0 refills | Status: DC

## 2020-04-11 MED ORDER — DOXYCYCLINE HYCLATE 100 MG PO CAPS: 100.0000 mg | ORAL_CAPSULE | Freq: Two times a day (BID) | ORAL | 0 refills | Status: DC

## 2020-04-11 MED ORDER — BENZONATATE 200 MG PO CAPS: 200.0000 mg | ORAL_CAPSULE | Freq: Three times a day (TID) | ORAL | 1 refills | Status: DC | PRN

## 2020-04-11 NOTE — Progress Notes (Signed)
Virtual Visit via Video Note  I connected with [name] on 04/11/20 at  1:30 PM EDT by a video enabled telemedicine application and verified that I am speaking with the correct person using two identifiers.  Location patient: home Location provider:work or home office Persons participating in the virtual visit: patient, provider, daughter  I discussed the limitations of evaluation and management by telemedicine and the availability of in person appointments. The patient expressed understanding and agreed to proceed.   HPI: 86 year old female who is being evaluated today for an acute issue.  She reports having productive cough over the last 2 weeks or so.  Mucus is white and brown.  She does endorse shortness of breath and wheezing.  No fevers or chills.  He has been vaccinated against COVID-19.  Her daughter went and got a Covid test and she was negative   ROS: See pertinent positives and negatives per HPI.  Past Medical History:  Diagnosis Date  . Adenocarcinoma, colon [facility]) Oncologist-- Dr Truitt Merle   Multifocal (2) Colon cancer @ ileocecal valve and ascending ( [id number]), Stage IIIB, grade I, MMR normal , 2 of 16 +lymph nodes, negative surgical margins---  06-02-2014  Right hemicolectomy w/ colostomy  . Allergy   . Anxiety   . Asthma    inhaler "sometimes"  . CAD (coronary artery disease)    a. LHC 4/09: pLAD 40, mDx 70-75, pLCx 30, mLCx 50, mRCA 90, EF 60% >> PCI: BMS x2 to RCA;  b. Myoview 8/12: normal  . Cataract    bil cateracts removed  . Chronic anemia   . Chronic diastolic CHF (congestive heart failure) (HCC)    a. Echo 912 - Mild LVH, EF 55-60%, no RWMA, Gr 1 DD, mild MR, mild LAE, mild RAE, PASP 59 mmHg (mod to severe pulmo HTN)  . Clotting disorder (HCC)    hx of dvt, tia on eliquis  . Colostomy in place [facility])    s/p colostomy takedown 5/16  . Colovaginal fistula    s/p colostomy >> colostomy takedown 5/16  . COPD with chronic bronchitis ([place])   . Depression   .  Dysrhythmia    A fib  . Essential hypertension   . GERD (gastroesophageal reflux disease)   . History of adenomatous polyp of colon   . History of blood transfusion   . History of DVT of lower extremity   . History of hiatal hernia   . History of TIA (transient ischemic attack)    a. Head CT 6/15: small chronic lacunar infarct in thalamus  . Hyperlipidemia   . OA (osteoarthritis)   . Osteoporosis   . Peripheral neuropathy   . Pneumonia   . Pulmonary HTN ([place])    a. PASP on Echo in 9/12:  59 mmHg  . Renal insufficiency   . Sigmoid diverticulosis    s/p sigmoid colectomy  . Stroke ([place])    TIAs  . Wears dentures   . Wears glasses     Past Surgical History:  Procedure Laterality Date  . ABDOMINAL HYSTERECTOMY    . ANTERIOR CERVICAL DECOMP/DISCECTOMY FUSION  01-31-2009   C5 -- 7  . APPENDECTOMY    . Bone spurs Bilateral    Feet  . CARDIOVASCULAR STRESS TEST  02-26-2011   Normal lexiscan no exercise study/  no ischemia/  normal LV function and wall motion, ef 67%  . CARPAL TUNNEL RELEASE Right   . CATARACT EXTRACTION W/ INTRAOCULAR LENS  IMPLANT, BILATERAL Bilateral   .  CHOLECYSTECTOMY    . COLOSTOMY N/A 06/02/2014   Procedure: DIVERTING DESCENDING END COLOSTOMY;  Surgeon: Donnie Mesa, MD;  Location: [place];  Service: General;  Laterality: N/A;  . CORONARY ANGIOPLASTY WITH STENT PLACEMENT  11-04-2007  dr bensimhon   BMS x2 to RCA/  mild Non-obstructive disease LAD/  normal LVF  . DILATION AND CURETTAGE OF UTERUS    . ESOPHAGOGASTRODUODENOSCOPY N/A 10/28/2017   Procedure: ESOPHAGOGASTRODUODENOSCOPY (EGD);  Surgeon: Yetta Flock, MD;  Location: [facility] ENDOSCOPY;  Service: Gastroenterology;  Laterality: N/A;  . EVALUATION UNDER ANESTHESIA WITH ANAL FISTULECTOMY N/A 10/13/2014   Procedure: ANAL EXAM UNDER ANESTHESIA ;  Surgeon: [name], MD;  Location: [facility];  Service: General;  Laterality: N/A;  . HAND SURGERY     Tendon repair  . LAPAROSCOPIC SIGMOID COLECTOMY N/A  11/24/2014   Procedure: SIGMOID COLECTOMY AND COLSOTOMY CLOSURE;  Surgeon: Donnie Mesa, MD;  Location: [place];  Service: General;  Laterality: N/A;  . LUMBAR LAMINECTOMY  10-29-2002,  1969   Left  L3 -- 4  decompression  . PARTIAL COLECTOMY N/A 06/02/2014   Procedure: RIGHT PARTIAL COLECTOMY ;  Surgeon: Donnie Mesa, MD;  Location: [place];  Service: General;  Laterality: N/A;  . PATELLECTOMY Right 09/14/2013   Procedure: PATELLECTOMY;  Surgeon: Meredith Pel, MD;  Location: [place];  Service: Orthopedics;  Laterality: Right;  . PROCTOSCOPY N/A 10/13/2014   Procedure: RIDGE PROCTOSCOPY;  Surgeon: [name], MD;  Location: [facility];  Service: General;  Laterality: N/A;  . REVISION TOTAL KNEE ARTHROPLASTY Bilateral right  04-02-2011/  left 1996 & 10-05-1999  . ROTATOR CUFF REPAIR Bilateral   . TONSILLECTOMY    . TOTAL KNEE ARTHROPLASTY Bilateral left 1994/  right 2000  . TOTAL KNEE REVISION Left 08/02/2015   Procedure: LEFT FEMORAL REVISION;  Surgeon: Gaynelle Arabian, MD;  Location: [facility];  Service: Orthopedics;  Laterality: Left;  . TRANSTHORACIC ECHOCARDIOGRAM  12-01-2009   Grade I diastolic dysfunction/  ef 60%/  moderate MR/  mild TR    Family History  Problem Relation Age of Onset  . Osteoarthritis Mother   . Heart failure Mother   . Colon cancer Maternal Uncle 87  . Heart Problems Father   . Cancer Sister 37       ? colon cancer   . Kidney cancer Brother 37  . Esophageal cancer Neg Hx   . Rectal cancer Neg Hx   . Stomach cancer Neg Hx   . Pancreatic cancer Neg Hx        Current Outpatient Medications:  .  acetaminophen (TYLENOL) 500 MG tablet, Take 1,000 mg by mouth every 6 (six) hours as needed for moderate pain. , Disp: , Rfl:  .  albuterol (PROAIR HFA) 108 (90 Base) MCG/ACT inhaler, Inhale 2 puffs into the lungs every 6 (six) hours as needed for wheezing or shortness of breath., Disp: 6.7 g, Rfl: 6 .  allopurinol (ZYLOPRIM) 100 MG tablet, Take 1 tablet (100 mg total) by  mouth daily., Disp: 90 tablet, Rfl: 1 .  apixaban (ELIQUIS) 5 MG TABS tablet, Take 1 tablet (5 mg total) by mouth 2 (two) times daily., Disp: 180 tablet, Rfl: 0 .  Ascorbic Acid (VITAMIN C) 1000 MG tablet, Take 1,000 mg by mouth daily., Disp: , Rfl:  .  benazepril (LOTENSIN) 40 MG tablet, Take 1 tablet (40 mg total) by mouth daily., Disp: 90 tablet, Rfl: 1 .  esomeprazole (NEXIUM) 40 MG capsule, Take 1 capsule (40 mg total)  by mouth daily., Disp: 90 capsule, Rfl: 1 .  famotidine (PEPCID) 20 MG tablet, Take 1 tablet (20 mg total) by mouth at bedtime., Disp: 30 tablet, Rfl: 3 .  ferrous sulfate 325 (65 FE) MG EC tablet, Take 325 mg by mouth daily with breakfast., Disp: , Rfl:  .  fluticasone (FLOVENT HFA) 44 MCG/ACT inhaler, Inhale 1 puff into the lungs in the morning and at bedtime., Disp: 1 Inhaler, Rfl: 3 .  furosemide (LASIX) 20 MG tablet, Take 2 tablets (40 mg total) by mouth daily., Disp: 180 tablet, Rfl: 3 .  gabapentin (NEURONTIN) 600 MG tablet, Take 1 tablet (600 mg total) by mouth 2 (two) times daily., Disp: 180 tablet, Rfl: 1 .  hydrocortisone (ANUSOL-HC) 2.5 % rectal cream, Place 1 application rectally 2 (two) times daily. (Patient taking differently: Place 1 application rectally 2 (two) times daily as needed. ), Disp: 30 g, Rfl: 1 .  LORazepam (ATIVAN) 0.5 MG tablet, Take 1 tablet (0.5 mg total) by mouth at bedtime., Disp: 30 tablet, Rfl: 2 .  nitroGLYCERIN (NITROSTAT) 0.4 MG SL tablet, Place 1 tablet (0.4 mg total) under the tongue every 5 (five) minutes as needed for chest pain (x 3 pills). Reported on 09/01/2015, Disp: 25 tablet, Rfl: 3 .  predniSONE (DELTASONE) 20 MG tablet, Take 1 tablet (20 mg total) by mouth daily with breakfast., Disp: 7 tablet, Rfl: 0 .  sertraline (ZOLOFT) 50 MG tablet, Take 1 tablet (50 mg total) by mouth daily., Disp: 90 tablet, Rfl: 1 .  Spacer/Aero-Holding Chambers (E-Z SPACER) inhaler, Use as instructed, Disp: 1 each, Rfl: 2 .  Tiotropium Bromide Monohydrate  (SPIRIVA RESPIMAT) 2.5 MCG/ACT AERS, Inhale 2 puffs into the lungs daily., Disp: 4 g, Rfl: 3 .  traZODone (DESYREL) 50 MG tablet, Take 1 tablet (50 mg total) by mouth at bedtime., Disp: 90 tablet, Rfl: 1 .  triamcinolone cream (KENALOG) 0.1 %, Apply 1 application topically 2 (two) times daily. , Disp: , Rfl:  .  benzonatate (TESSALON) 200 MG capsule, Take 1 capsule (200 mg total) by mouth 3 (three) times daily as needed for cough., Disp: 30 capsule, Rfl: 1 .  doxycycline (VIBRAMYCIN) 100 MG capsule, Take 1 capsule (100 mg total) by mouth 2 (two) times daily., Disp: 20 capsule, Rfl: 0 .  metoprolol tartrate (LOPRESSOR) 50 MG tablet, Take 1.5 tablets (75 mg total) by mouth 2 (two) times daily., Disp: 270 tablet, Rfl: 1 .  predniSONE (DELTASONE) 10 MG tablet, 40 mg x 3 days, 20 mg x 3 days, 10 mg x 3 days, Disp: 21 tablet, Rfl: 0  EXAM:  VITALS per patient if applicable:  GENERAL: alert, oriented, appears well and in no acute distress  HEENT: atraumatic, conjunttiva clear, no obvious abnormalities on inspection of external nose and ears  NECK: normal movements of the head and neck  LUNGS: on inspection no signs of respiratory distress, breathing rate appears normal, no obvious gross SOB, gasping or wheezing  CV: no obvious cyanosis  MS: moves all visible extremities without noticeable abnormality  PSYCH/NEURO: pleasant and cooperative, no obvious depression or anxiety, speech and thought processing grossly intact  ASSESSMENT AND PLAN:  Discussed the following assessment and plan:  1. Cough -We will cover for both bronchitis as well as doxycycline.  Follow-up if no improvement over the next 3 to 4 days. - doxycycline (VIBRAMYCIN) 100 MG capsule; Take 1 capsule (100 mg total) by mouth 2 (two) times daily.  Dispense: 20 capsule; Refill: 0 - benzonatate (TESSALON)  200 MG capsule; Take 1 capsule (200 mg total) by mouth 3 (three) times daily as needed for cough.  Dispense: 30 capsule; Refill:  1 - predniSONE (DELTASONE) 10 MG tablet; 40 mg x 3 days, 20 mg x 3 days, 10 mg x 3 days  Dispense: 21 tablet; Refill: 0       I discussed the assessment and treatment plan with the patient. The patient was provided an opportunity to ask questions and all were answered. The patient agreed with the plan and demonstrated an understanding of the instructions.   The patient was advised to call back or seek an in-person evaluation if the symptoms worsen or if the condition fails to improve as anticipated.   [name], NP

## 2020-04-15 ENCOUNTER — Other Ambulatory Visit: Payer: Self-pay | Admitting: Adult Health

## 2020-04-15 DIAGNOSIS — Z760 Encounter for issue of repeat prescription: Secondary | ICD-10-CM

## 2020-04-17 ENCOUNTER — Telehealth: Payer: Self-pay | Admitting: Pharmacist

## 2020-04-17 DIAGNOSIS — E7800 Pure hypercholesterolemia, unspecified: Secondary | ICD-10-CM

## 2020-04-17 DIAGNOSIS — I10 Essential (primary) hypertension: Secondary | ICD-10-CM

## 2020-04-17 NOTE — Chronic Care Management (AMB) (Addendum)
Chronic Care Management Pharmacy Assistant   Name: [name]  MRN: [id number] DOB: 11-Oct-1929  Reason for Encounter: Medication Review/General Adherence call.  PCP : [name], NP  Allergies:  No Known Allergies  Medications: Outpatient Encounter Medications as of 04/17/2020  Medication Sig  . acetaminophen (TYLENOL) 500 MG tablet Take 1,000 mg by mouth every 6 (six) hours as needed for moderate pain.   Marland Kitchen albuterol (PROAIR HFA) 108 (90 Base) MCG/ACT inhaler Inhale 2 puffs into the lungs every 6 (six) hours as needed for wheezing or shortness of breath.  . allopurinol (ZYLOPRIM) 100 MG tablet Take 1 tablet (100 mg total) by mouth daily.  Marland Kitchen apixaban (ELIQUIS) 5 MG TABS tablet Take 1 tablet (5 mg total) by mouth 2 (two) times daily.  . Ascorbic Acid (VITAMIN C) 1000 MG tablet Take 1,000 mg by mouth daily.  . benazepril (LOTENSIN) 40 MG tablet Take 1 tablet (40 mg total) by mouth daily.  . benzonatate (TESSALON) 200 MG capsule Take 1 capsule (200 mg total) by mouth 3 (three) times daily as needed for cough.  . doxycycline (VIBRAMYCIN) 100 MG capsule Take 1 capsule (100 mg total) by mouth 2 (two) times daily.  Marland Kitchen esomeprazole (NEXIUM) 40 MG capsule Take 1 capsule (40 mg total) by mouth daily.  . famotidine (PEPCID) 20 MG tablet Take 1 tablet (20 mg total) by mouth at bedtime.  . ferrous sulfate 325 (65 FE) MG EC tablet Take 325 mg by mouth daily with breakfast.  . fluticasone (FLOVENT HFA) 44 MCG/ACT inhaler Inhale 1 puff into the lungs in the morning and at bedtime.  . furosemide (LASIX) 20 MG tablet Take 2 tablets (40 mg total) by mouth daily.  Marland Kitchen gabapentin (NEURONTIN) 600 MG tablet Take 1 tablet (600 mg total) by mouth 2 (two) times daily.  . hydrocortisone (ANUSOL-HC) 2.5 % rectal cream Place 1 application rectally 2 (two) times daily. (Patient taking differently: Place 1 application rectally 2 (two) times daily as needed. )  . LORazepam (ATIVAN) 0.5 MG tablet Take 1 tablet (0.5  mg total) by mouth at bedtime.  . metoprolol tartrate (LOPRESSOR) 50 MG tablet Take 1.5 tablets (75 mg total) by mouth 2 (two) times daily.  . nitroGLYCERIN (NITROSTAT) 0.4 MG SL tablet Place 1 tablet (0.4 mg total) under the tongue every 5 (five) minutes as needed for chest pain (x 3 pills). Reported on 09/01/2015  . predniSONE (DELTASONE) 10 MG tablet 40 mg x 3 days, 20 mg x 3 days, 10 mg x 3 days  . predniSONE (DELTASONE) 20 MG tablet Take 1 tablet (20 mg total) by mouth daily with breakfast.  . sertraline (ZOLOFT) 50 MG tablet Take 1 tablet (50 mg total) by mouth daily.  Marland Kitchen Spacer/Aero-Holding Chambers (E-Z SPACER) inhaler Use as instructed  . Tiotropium Bromide Monohydrate (SPIRIVA RESPIMAT) 2.5 MCG/ACT AERS Inhale 2 puffs into the lungs daily.  . traZODone (DESYREL) 50 MG tablet Take 1 tablet (50 mg total) by mouth at bedtime.  . triamcinolone cream (KENALOG) 0.1 % Apply 1 application topically 2 (two) times daily.    No facility-administered encounter medications on file as of 04/17/2020.    Current Diagnosis: Patient Active Problem List   Diagnosis Date Noted  . Mediastinal adenopathy 01/21/2020  . Cellulitis 08/03/2019  . CKD (chronic kidney disease), stage III ([place]) 08/03/2019  . Centrilobular emphysema ([place]) 04/06/2019  . Gastric ulcer   . Thrombocytopenia ([place]) 10/27/2017  . Abnormal stress test 10/27/2017  . Moderate mitral  regurgitation 10/27/2017  . Acute on chronic diastolic CHF (congestive heart failure) ([place]) 10/27/2017  . CAP (community acquired pneumonia) 10/26/2017  . Displaced fracture of lateral malleolus of right fibula, subsequent encounter for closed fracture with routine healing 12/27/2016  . Iron deficiency anemia 11/12/2016  . Chronic anticoagulation 11/12/2016  . Chronic atrial fibrillation ([place]) 08/05/2016  . Coronary artery disease of native artery of native heart with stable angina pectoris ([place]) 08/05/2016  . Failed total left knee replacement ([place])  08/02/2015  . Sigmoid diverticulitis 11/24/2014  . Neuropathic pain 06/13/2014  . Insomnia 06/13/2014  . Anxiety and depression 06/13/2014  . Cancer of ascending colon ([place]) 06/13/2014  . Colovaginal fistula 04/20/2014  . Lethargy 09/15/2013  . Chest pain, musculoskeletal 02/19/2011  . Depression 02/07/2010  . DIASTOLIC HEART FAILURE, CHRONIC 02/27/2009  . Pure hypercholesterolemia 04/05/2008  . Coronary atherosclerosis 04/05/2008  . Osteoarthritis 05/19/2007  . Essential hypertension 01/21/2007  . GERD 01/21/2007  . Hiatal hernia 01/21/2007  . Diverticulosis of large intestine 01/21/2007  . COLONIC POLYPS, HX OF 01/21/2007    Goals Addressed   None    Reviewed chart for medication changes ahead of medication coordination call. No Consults, or hospital visits since last care coordination call/Pharmacist visit. Medication changes indicated from her resented visit.  BP Readings from Last 3 Encounters:  04/11/20 119/75  02/02/20 126/80  01/21/20 128/72    Lab Results  Component Value Date   HGBA1C 5.9 (H) 05/31/2014     Patient obtains medications through Vials  90 Days   Last adherence delivery included:  . Spiriva 2.5 mg: Inhale two puffs twice a day . Lorazepam 0.5 mg; one tablet twice a day  Patient is due for next adherence delivery on: 04/24/2020. Called patient and reviewed medications and coordinated delivery.  This delivery to include: . Allopurinol 100 mg: One tablet by mouth daily. . Furosemide 20 mg: Two tablets by mouth daily. . Famotidine 20 mg: One tablet by mouth at bedtime. . Gabapentin 600 mg: One tablet by mouth 2 (two) times daily. . Trazodone 50 mg: One tablet  by mouth at bedtime. . Sertraline 50 mg: One tablet  by mouth daily. . Esomeprazole 40 mg: One capsule  by mouth daily. . Benazepril 40 mg: one tablet by mouth daily. . Lorazepam 0.5 mg one tablet  by mouth at bedtime.  Patient declined the following medications due to supply on  hand: . Spiriva 2.5 mg: Inhale two puffs twice a day . Fluticasone (FLOVENT HFA) 44 MCG/ACT inhaler . triamcinolone cream (KENALOG) 0.1 % . Silver Sulfadiazine 1% topical cream  Patient needs refills for: . Famotidine 20 mg: One tablet by mouth at bedtime  Confirmed delivery date of 04/24/2020, advised patient that pharmacy will contact them the morning of delivery.  Follow-Up:  Coordination of Enhanced Pharmacy Services   I called the patient and discussed medication adherence with the patient, no issues at this time with current medication. She states she is feeling better since her video visit last week.She is still currently taking the medication from that visit.   Maia Breslow, [place] Assistant [phone]

## 2020-04-22 ENCOUNTER — Other Ambulatory Visit: Payer: Self-pay | Admitting: Primary Care

## 2020-04-22 ENCOUNTER — Other Ambulatory Visit: Payer: Self-pay | Admitting: Adult Health

## 2020-05-18 ENCOUNTER — Telehealth: Payer: Self-pay | Admitting: Pharmacist

## 2020-05-18 NOTE — Chronic Care Management (AMB) (Signed)
Chronic Care Management Pharmacy Assistant   Name: [name]  MRN: [id number] DOB: [birth year]  Reason for Encounter: Medication Review  Patient Questions:  1.  Have you seen any other providers since your last visit? No  2.  Any changes in your medicines or health? No    PCP : [name], NP  Allergies:  No Known Allergies  Medications: Outpatient Encounter Medications as of 05/18/2020  Medication Sig  . acetaminophen (TYLENOL) 500 MG tablet Take 1,000 mg by mouth every 6 (six) hours as needed for moderate pain.   Marland Kitchen albuterol (PROAIR HFA) 108 (90 Base) MCG/ACT inhaler Inhale 2 puffs into the lungs every 6 (six) hours as needed for wheezing or shortness of breath.  . allopurinol (ZYLOPRIM) 100 MG tablet Take 1 tablet (100 mg total) by mouth daily.  Marland Kitchen apixaban (ELIQUIS) 5 MG TABS tablet Take 1 tablet (5 mg total) by mouth 2 (two) times daily.  . Ascorbic Acid (VITAMIN C) 1000 MG tablet Take 1,000 mg by mouth daily.  . benazepril (LOTENSIN) 40 MG tablet TAKE ONE TABLET BY MOUTH ONCE DAILY  . benzonatate (TESSALON) 200 MG capsule Take 1 capsule (200 mg total) by mouth 3 (three) times daily as needed for cough.  . doxycycline (VIBRAMYCIN) 100 MG capsule Take 1 capsule (100 mg total) by mouth 2 (two) times daily.  Marland Kitchen esomeprazole (NEXIUM) 40 MG capsule TAKE ONE CAPSULE BY MOUTH ONCE DAILY  . famotidine (PEPCID) 20 MG tablet TAKE ONE TABLET BY MOUTH EVERYDAY AT BEDTIME  . ferrous sulfate 325 (65 FE) MG EC tablet Take 325 mg by mouth daily with breakfast.  . fluticasone (FLOVENT HFA) 44 MCG/ACT inhaler Inhale 1 puff into the lungs in the morning and at bedtime.  . furosemide (LASIX) 20 MG tablet Take 2 tablets (40 mg total) by mouth daily.  Marland Kitchen gabapentin (NEURONTIN) 600 MG tablet Take 1 tablet (600 mg total) by mouth 2 (two) times daily.  . hydrocortisone (ANUSOL-HC) 2.5 % rectal cream Place 1 application rectally 2 (two) times daily. (Patient taking differently: Place 1 application  rectally 2 (two) times daily as needed. )  . LORazepam (ATIVAN) 0.5 MG tablet TAKE ONE TABLET BY MOUTH EVERYDAY AT BEDTIME  . metoprolol tartrate (LOPRESSOR) 50 MG tablet Take 1.5 tablets (75 mg total) by mouth 2 (two) times daily.  . nitroGLYCERIN (NITROSTAT) 0.4 MG SL tablet Place 1 tablet (0.4 mg total) under the tongue every 5 (five) minutes as needed for chest pain (x 3 pills). Reported on 09/01/2015  . predniSONE (DELTASONE) 10 MG tablet 40 mg x 3 days, 20 mg x 3 days, 10 mg x 3 days  . predniSONE (DELTASONE) 20 MG tablet Take 1 tablet (20 mg total) by mouth daily with breakfast.  . sertraline (ZOLOFT) 50 MG tablet Take 1 tablet (50 mg total) by mouth daily.  Marland Kitchen Spacer/Aero-Holding Chambers (E-Z SPACER) inhaler Use as instructed  . Tiotropium Bromide Monohydrate (SPIRIVA RESPIMAT) 2.5 MCG/ACT AERS Inhale 2 puffs into the lungs daily.  . traZODone (DESYREL) 50 MG tablet Take 1 tablet (50 mg total) by mouth at bedtime.  . triamcinolone cream (KENALOG) 0.1 % Apply 1 application topically 2 (two) times daily.    No facility-administered encounter medications on file as of 05/18/2020.    Current Diagnosis: Patient Active Problem List   Diagnosis Date Noted  . Mediastinal adenopathy 01/21/2020  . Cellulitis 08/03/2019  . CKD (chronic kidney disease), stage III ([place]) 08/03/2019  . Centrilobular emphysema ([place]) 04/06/2019  .  Gastric ulcer   . Thrombocytopenia ([place]) 10/27/2017  . Abnormal stress test 10/27/2017  . Moderate mitral regurgitation 10/27/2017  . Acute on chronic diastolic CHF (congestive heart failure) ([place]) 10/27/2017  . CAP (community acquired pneumonia) 10/26/2017  . Displaced fracture of lateral malleolus of right fibula, subsequent encounter for closed fracture with routine healing 12/27/2016  . Iron deficiency anemia 11/12/2016  . Chronic anticoagulation 11/12/2016  . Chronic atrial fibrillation ([place]) 08/05/2016  . Coronary artery disease of native artery of native heart  with stable angina pectoris ([place]) 08/05/2016  . Failed total left knee replacement ([place]) 08/02/2015  . Sigmoid diverticulitis 11/24/2014  . Neuropathic pain 06/13/2014  . Insomnia 06/13/2014  . Anxiety and depression 06/13/2014  . Cancer of ascending colon ([place]) 06/13/2014  . Colovaginal fistula 04/20/2014  . Lethargy 09/15/2013  . Chest pain, musculoskeletal 02/19/2011  . Depression 02/07/2010  . DIASTOLIC HEART FAILURE, CHRONIC 02/27/2009  . Pure hypercholesterolemia 04/05/2008  . Coronary atherosclerosis 04/05/2008  . Osteoarthritis 05/19/2007  . Essential hypertension 01/21/2007  . GERD 01/21/2007  . Hiatal hernia 01/21/2007  . Diverticulosis of large intestine 01/21/2007  . COLONIC POLYPS, HX OF 01/21/2007    Goals Addressed   None    Reviewed chart for medication changes ahead of medication coordination call. No OVs, Consults, or hospital visits since last care coordination call/Pharmacist visit. No medication changes indicated OR if recent visit, treatment plan here.  BP Readings from Last 3 Encounters:  04/11/20 119/75  02/02/20 126/80  01/21/20 128/72    Lab Results  Component Value Date   HGBA1C 5.9 (H) 05/31/2014     Patient obtains medications through Vials  90 Days   Last adherence delivery included:  Marland Kitchen Allopurinol 100 mg: One tablet by mouth daily. . Furosemide 20 mg: Two tablets by mouth daily. . Famotidine 20 mg: One tablet by mouth at bedtime. . Gabapentin 600 mg: One tablet by mouth 2 (two) times daily. . Trazodone 50 mg: One tablet by mouth at bedtime. . Sertraline 50 mg: One tablet by mouth daily. . Esomeprazole 40 mg: One capsule by mouth daily. . Benazepril 40 mg: one tablet by mouth daily. . Lorazepam 0.5 mg one tablet by mouth at bedtime.   I spoke with the patient and review medications. There are no changes in medications currently. The patient is taking the following medications: o Allopurinol 100 mg: One tablet by mouth  daily. o Furosemide 20 mg: Two tablets by mouth daily. o Famotidine 20 mg: One tablet by mouth at bedtime. o Gabapentin 600 mg: One tablet by mouth 2 (two) times daily. o Trazodone 50 mg: One tablet by mouth at bedtime. o Sertraline 50 mg: One tablet by mouth daily. o Esomeprazole 40 mg: One capsule by mouth daily. o Benazepril 40 mg: one tablet by mouth daily. o Lorazepam 0.5 mg one tablet by mouth at bedtime. o Spiriva 2.5mg : Inhale two puffs twice a day o fluticasone (FLOVENT HFA) 44 MCG/ACT inhaler o triamcinolone cream (KENALOG) 0.1 % o Silver Sulfadiazine 1% topical cream  The patient does not need refill currently.  Confirmed delivery date of 07-23-2020, advised patient that pharmacy will contact them the morning of delivery.  Follow-Up:  Coordination of Enhanced Pharmacy Services and Pharmacist Review   Maia Breslow, [place] Assistant [phone]

## 2020-05-24 ENCOUNTER — Other Ambulatory Visit: Payer: Self-pay

## 2020-05-24 ENCOUNTER — Ambulatory Visit (INDEPENDENT_AMBULATORY_CARE_PROVIDER_SITE_OTHER): Payer: HMO

## 2020-05-24 DIAGNOSIS — Z0000 Encounter for general adult medical examination without abnormal findings: Secondary | ICD-10-CM

## 2020-05-24 NOTE — Patient Instructions (Addendum)
[name] , Thank you for taking time to come for your Medicare Wellness Visit. I appreciate your ongoing commitment to your health goals. Please review the following plan we discussed and let me know if I can assist you in the future.   Screening recommendations/referrals: Colonoscopy: Done 12/25/16 Mammogram: No longer required Bone Density: Done 09/23/17 Recommended yearly ophthalmology/optometry visit for glaucoma screening and checkup Recommended yearly dental visit for hygiene and checkup  Vaccinations: Influenza vaccine: Done 04/29/20 Pneumococcal vaccine: Up to date Tdap vaccine: Up to date Shingles vaccine: Shingrix discussed. Please contact your pharmacy for coverage information.    Covid-19:Completed 02/02/20 & 02/23/20  Advanced directives: Please bring a copy of your health care power of attorney and living will to the office at your convenience.  Conditions/risks identified: maintain health  Next appointment: Follow up in one year for your annual wellness visit   Preventive Care 65 Years and Older, Female Preventive care refers to lifestyle choices and visits with your health care provider that can promote health and wellness. What does preventive care include?  A yearly physical exam. This is also called an annual well check.  Dental exams once or twice a year.  Routine eye exams. Ask your health care provider how often you should have your eyes checked.  Personal lifestyle choices, including:  Daily care of your teeth and gums.  Regular physical activity.  Eating a healthy diet.  Avoiding tobacco and drug use.  Limiting alcohol use.  Practicing safe sex.  Taking low-dose aspirin every day.  Taking vitamin and mineral supplements as recommended by your health care provider. What happens during an annual well check? The services and screenings done by your health care provider during your annual well check will depend on your age, overall health, lifestyle  risk factors, and family history of disease. Counseling  Your health care provider Holtmeyer ask you questions about your:  Alcohol use.  Tobacco use.  Drug use.  Emotional well-being.  Home and relationship well-being.  Sexual activity.  Eating habits.  History of falls.  Memory and ability to understand (cognition).  Work and work Statistician.  Reproductive health. Screening  You Pulido have the following tests or measurements:  Height, weight, and BMI.  Blood pressure.  Lipid and cholesterol levels. These [place] be checked every 5 years, or more frequently if you are over 8 years old.  Skin check.  Lung cancer screening. You Zechman have this screening every year starting at age 55 if you have a 30-pack-year history of smoking and currently smoke or have quit within the past 15 years.  Fecal occult blood test (FOBT) of the stool. You Gouger have this test every year starting at age 49.  Flexible sigmoidoscopy or colonoscopy. You Debenedetto have a sigmoidoscopy every 5 years or a colonoscopy every 10 years starting at age 64.  Hepatitis C blood test.  Hepatitis B blood test.  Sexually transmitted disease (STD) testing.  Diabetes screening. This is done by checking your blood sugar (glucose) after you have not eaten for a while (fasting). You Mee have this done every 1-3 years.  Bone density scan. This is done to screen for osteoporosis. You Daher have this done starting at age 78.  Mammogram. This Nemetz be done every 1-2 years. Talk to your health care provider about how often you should have regular mammograms. Talk with your health care provider about your test results, treatment options, and if necessary, the need for more tests. Vaccines  Your health  care provider Blazier recommend certain vaccines, such as:  Influenza vaccine. This is recommended every year.  Tetanus, diphtheria, and acellular pertussis (Tdap, Td) vaccine. You Gassett need a Td booster every 10 years.  Zoster vaccine.  You Beckerman need this after age 89.  Pneumococcal 13-valent conjugate (PCV13) vaccine. One dose is recommended after age 45.  Pneumococcal polysaccharide (PPSV23) vaccine. One dose is recommended after age 69. Talk to your health care provider about which screenings and vaccines you need and how often you need them. This information is not intended to replace advice given to you by your health care provider. Make sure you discuss any questions you have with your health care provider. Document Released: 07/28/2015 Document Revised: 03/20/2016 Document Reviewed: 05/02/2015 Elsevier Interactive Patient Education  2017 [place] Prevention in the Home Falls can cause injuries. They can happen to people of all ages. There are many things you can do to make your home safe and to help prevent falls. What can I do on the outside of my home?  Regularly fix the edges of walkways and driveways and fix any cracks.  Remove anything that might make you trip as you walk through a door, such as a raised step or threshold.  Trim any bushes or trees on the path to your home.  Use bright outdoor lighting.  Clear any walking paths of anything that might make someone trip, such as rocks or tools.  Regularly check to see if handrails are loose or broken. Make sure that both sides of any steps have handrails.  Any raised decks and porches should have guardrails on the edges.  Have any leaves, snow, or ice cleared regularly.  Use sand or salt on walking paths during winter.  Clean up any spills in your garage right away. This includes oil or grease spills. What can I do in the bathroom?  Use night lights.  Install grab bars by the toilet and in the tub and shower. Do not use towel bars as grab bars.  Use non-skid mats or decals in the tub or shower.  If you need to sit down in the shower, use a plastic, non-slip stool.  Keep the floor dry. Clean up any water that spills on the floor as soon  as it happens.  Remove soap buildup in the tub or shower regularly.  Attach bath mats securely with double-sided non-slip rug tape.  Do not have throw rugs and other things on the floor that can make you trip. What can I do in the bedroom?  Use night lights.  Make sure that you have a light by your bed that is easy to reach.  Do not use any sheets or blankets that are too big for your bed. They should not hang down onto the floor.  Have a firm chair that has side arms. You can use this for support while you get dressed.  Do not have throw rugs and other things on the floor that can make you trip. What can I do in the kitchen?  Clean up any spills right away.  Avoid walking on wet floors.  Keep items that you use a lot in easy-to-reach places.  If you need to reach something above you, use a strong step stool that has a grab bar.  Keep electrical cords out of the way.  Do not use floor polish or wax that makes floors slippery. If you must use wax, use non-skid floor wax.  Do not have  throw rugs and other things on the floor that can make you trip. What can I do with my stairs?  Do not leave any items on the stairs.  Make sure that there are handrails on both sides of the stairs and use them. Fix handrails that are broken or loose. Make sure that handrails are as long as the stairways.  Check any carpeting to make sure that it is firmly attached to the stairs. Fix any carpet that is loose or worn.  Avoid having throw rugs at the top or bottom of the stairs. If you do have throw rugs, attach them to the floor with carpet tape.  Make sure that you have a light switch at the top of the stairs and the bottom of the stairs. If you do not have them, ask someone to add them for you. What else can I do to help prevent falls?  Wear shoes that:  Do not have high heels.  Have rubber bottoms.  Are comfortable and fit you well.  Are closed at the toe. Do not wear sandals.  If  you use a stepladder:  Make sure that it is fully opened. Do not climb a closed stepladder.  Make sure that both sides of the stepladder are locked into place.  Ask someone to hold it for you, if possible.  Clearly mark and make sure that you can see:  Any grab bars or handrails.  First and last steps.  Where the edge of each step is.  Use tools that help you move around (mobility aids) if they are needed. These include:  Canes.  Walkers.  Scooters.  Crutches.  Turn on the lights when you go into a dark area. Replace any light bulbs as soon as they burn out.  Set up your furniture so you have a clear path. Avoid moving your furniture around.  If any of your floors are uneven, fix them.  If there are any pets around you, be aware of where they are.  Review your medicines with your doctor. Some medicines can make you feel dizzy. This can increase your chance of falling. Ask your doctor what other things that you can do to help prevent falls. This information is not intended to replace advice given to you by your health care provider. Make sure you discuss any questions you have with your health care provider. Document Released: 04/27/2009 Document Revised: 12/07/2015 Document Reviewed: 08/05/2014 Elsevier Interactive Patient Education  2017 [name].

## 2020-05-24 NOTE — Progress Notes (Addendum)
Virtual Visit via Telephone Note  I connected with  [name] on 05/24/20 at 11:00 AM EST by telephone and verified that I am speaking with the correct person using two identifiers.  Medicare Annual Wellness visit completed telephonically due to Covid-19 pandemic.   Persons participating in this call: This Health Coach and this patient.   Location: Patient: Home Provider: Office   I discussed the limitations, risks, security and privacy concerns of performing an evaluation and management service by telephone and the availability of in person appointments. The patient expressed understanding and agreed to proceed.  Unable to perform video visit due to video visit attempted and failed and/or patient does not have video capability.   Some vital signs [place] be absent or patient reported.   Willette Brace, LPN    Subjective:   [name] is a 86 y.o. female who presents for an Initial Medicare Annual Wellness Visit.  Review of Systems     Cardiac Risk Factors include: obesity (BMI >30kg/m2);hypertension;advanced age (>19men, >68 women)     Objective:    Today's Vitals   05/24/20 1018  PainSc: 4    There is no height or weight on file to calculate BMI.  Advanced Directives 05/24/2020 08/03/2019 08/02/2019 07/28/2019 07/21/2019 07/10/2019 10/28/2018  Does Patient Have a Medical Advance Directive? Yes Yes Yes Yes Yes No Yes  Type of Paramedic of [place];Living will Living will [place];Living will [place];Living will  Does patient want to make changes to medical advance directive? - No - Patient declined - No - Patient declined - - No - Patient declined  Copy of [place] in Chart? No - copy requested - - - - - -  Would patient like information on creating a medical advance directive? - No - Patient declined No - Patient declined - - Yes (ED - Information included  in AVS) -    Current Medications (verified) Outpatient Encounter Medications as of 05/24/2020  Medication Sig  . acetaminophen (TYLENOL) 500 MG tablet Take 1,000 mg by mouth every 6 (six) hours as needed for moderate pain.   Marland Kitchen albuterol (PROAIR HFA) 108 (90 Base) MCG/ACT inhaler Inhale 2 puffs into the lungs every 6 (six) hours as needed for wheezing or shortness of breath.  . allopurinol (ZYLOPRIM) 100 MG tablet Take 1 tablet (100 mg total) by mouth daily.  Marland Kitchen apixaban (ELIQUIS) 5 MG TABS tablet Take 1 tablet (5 mg total) by mouth 2 (two) times daily.  . Ascorbic Acid (VITAMIN C) 1000 MG tablet Take 1,000 mg by mouth daily.  . benazepril (LOTENSIN) 40 MG tablet TAKE ONE TABLET BY MOUTH ONCE DAILY  . esomeprazole (NEXIUM) 40 MG capsule TAKE ONE CAPSULE BY MOUTH ONCE DAILY  . famotidine (PEPCID) 20 MG tablet TAKE ONE TABLET BY MOUTH EVERYDAY AT BEDTIME  . ferrous sulfate 325 (65 FE) MG EC tablet Take 325 mg by mouth daily with breakfast.  . furosemide (LASIX) 20 MG tablet Take 2 tablets (40 mg total) by mouth daily.  Marland Kitchen gabapentin (NEURONTIN) 600 MG tablet Take 1 tablet (600 mg total) by mouth 2 (two) times daily.  Marland Kitchen LORazepam (ATIVAN) 0.5 MG tablet TAKE ONE TABLET BY MOUTH EVERYDAY AT BEDTIME  . metoprolol tartrate (LOPRESSOR) 50 MG tablet Take 1.5 tablets (75 mg total) by mouth 2 (two) times daily.  . nitroGLYCERIN (NITROSTAT) 0.4 MG SL tablet Place 1 tablet (0.4 mg  total) under the tongue every 5 (five) minutes as needed for chest pain (x 3 pills). Reported on 09/01/2015  . sertraline (ZOLOFT) 50 MG tablet Take 1 tablet (50 mg total) by mouth daily.  . silver sulfADIAZINE (SILVADENE) 1 % cream SMARTSIG:1 Sparingly Topical Twice Daily  . Spacer/Aero-Holding Chambers (E-Z SPACER) inhaler Use as instructed  . Tiotropium Bromide Monohydrate (SPIRIVA RESPIMAT) 2.5 MCG/ACT AERS Inhale 2 puffs into the lungs daily.  . traZODone (DESYREL) 50 MG tablet Take 1 tablet (50 mg total) by mouth at bedtime.    . triamcinolone cream (KENALOG) 0.1 % Apply 1 application topically 2 (two) times daily.   . fluticasone (FLOVENT HFA) 44 MCG/ACT inhaler Inhale 1 puff into the lungs in the morning and at bedtime. (Patient not taking: Reported on 05/24/2020)  . hydrocortisone (ANUSOL-HC) 2.5 % rectal cream Place 1 application rectally 2 (two) times daily. (Patient not taking: Reported on 05/24/2020)  . [DISCONTINUED] benzonatate (TESSALON) 200 MG capsule Take 1 capsule (200 mg total) by mouth 3 (three) times daily as needed for cough. (Patient not taking: Reported on 05/24/2020)  . [DISCONTINUED] doxycycline (VIBRAMYCIN) 100 MG capsule Take 1 capsule (100 mg total) by mouth 2 (two) times daily. (Patient not taking: Reported on 05/24/2020)  . [DISCONTINUED] predniSONE (DELTASONE) 10 MG tablet 40 mg x 3 days, 20 mg x 3 days, 10 mg x 3 days (Patient not taking: Reported on 05/24/2020)  . [DISCONTINUED] predniSONE (DELTASONE) 20 MG tablet Take 1 tablet (20 mg total) by mouth daily with breakfast. (Patient not taking: Reported on 05/24/2020)   No facility-administered encounter medications on file as of 05/24/2020.    Allergies (verified) Patient has no known allergies.   History: Past Medical History:  Diagnosis Date  . Adenocarcinoma, colon [facility]) Oncologist-- Dr Truitt Merle   Multifocal (2) Colon cancer @ ileocecal valve and ascending ( [id number]), Stage IIIB, grade I, MMR normal , 2 of 16 +lymph nodes, negative surgical margins---  06-02-2014  Right hemicolectomy w/ colostomy  . Allergy   . Anxiety   . Asthma    inhaler "sometimes"  . CAD (coronary artery disease)    a. LHC 4/09: pLAD 40, mDx 70-75, pLCx 30, mLCx 50, mRCA 90, EF 60% >> PCI: BMS x2 to RCA;  b. Myoview 8/12: normal  . Cataract    bil cateracts removed  . Chronic anemia   . Chronic diastolic CHF (congestive heart failure) (HCC)    a. Echo 912 - Mild LVH, EF 55-60%, no RWMA, Gr 1 DD, mild MR, mild LAE, mild RAE, PASP 59 mmHg (mod to severe pulmo  HTN)  . Clotting disorder (HCC)    hx of dvt, tia on eliquis  . Colostomy in place [facility])    s/p colostomy takedown 5/16  . Colovaginal fistula    s/p colostomy >> colostomy takedown 5/16  . COPD with chronic bronchitis ([place])   . Depression   . Dysrhythmia    A fib  . Essential hypertension   . GERD (gastroesophageal reflux disease)   . History of adenomatous polyp of colon   . History of blood transfusion   . History of DVT of lower extremity   . History of hiatal hernia   . History of TIA (transient ischemic attack)    a. Head CT 6/15: small chronic lacunar infarct in thalamus  . Hyperlipidemia   . OA (osteoarthritis)   . Osteoporosis   . Peripheral neuropathy   . Pneumonia   . Pulmonary HTN ([place])  a. PASP on Echo in 9/12:  59 mmHg  . Renal insufficiency   . Sigmoid diverticulosis    s/p sigmoid colectomy  . Stroke ([place])    TIAs  . Wears dentures   . Wears glasses    Past Surgical History:  Procedure Laterality Date  . ABDOMINAL HYSTERECTOMY    . ANTERIOR CERVICAL DECOMP/DISCECTOMY FUSION  01-31-2009   C5 -- 7  . APPENDECTOMY    . Bone spurs Bilateral    Feet  . CARDIOVASCULAR STRESS TEST  02-26-2011   Normal lexiscan no exercise study/  no ischemia/  normal LV function and wall motion, ef 67%  . CARPAL TUNNEL RELEASE Right   . CATARACT EXTRACTION W/ INTRAOCULAR LENS  IMPLANT, BILATERAL Bilateral   . CHOLECYSTECTOMY    . COLOSTOMY N/A 06/02/2014   Procedure: DIVERTING DESCENDING END COLOSTOMY;  Surgeon: Donnie Mesa, MD;  Location: [place];  Service: General;  Laterality: N/A;  . CORONARY ANGIOPLASTY WITH STENT PLACEMENT  11-04-2007  dr bensimhon   BMS x2 to RCA/  mild Non-obstructive disease LAD/  normal LVF  . DILATION AND CURETTAGE OF UTERUS    . ESOPHAGOGASTRODUODENOSCOPY N/A 10/28/2017   Procedure: ESOPHAGOGASTRODUODENOSCOPY (EGD);  Surgeon: Yetta Flock, MD;  Location: [facility] ENDOSCOPY;  Service: Gastroenterology;  Laterality: N/A;  . EVALUATION UNDER  ANESTHESIA WITH ANAL FISTULECTOMY N/A 10/13/2014   Procedure: ANAL EXAM UNDER ANESTHESIA ;  Surgeon: [name], MD;  Location: [facility];  Service: General;  Laterality: N/A;  . HAND SURGERY     Tendon repair  . LAPAROSCOPIC SIGMOID COLECTOMY N/A 11/24/2014   Procedure: SIGMOID COLECTOMY AND COLSOTOMY CLOSURE;  Surgeon: Donnie Mesa, MD;  Location: [place];  Service: General;  Laterality: N/A;  . LUMBAR LAMINECTOMY  10-29-2002,  1969   Left  L3 -- 4  decompression  . PARTIAL COLECTOMY N/A 06/02/2014   Procedure: RIGHT PARTIAL COLECTOMY ;  Surgeon: Donnie Mesa, MD;  Location: [place];  Service: General;  Laterality: N/A;  . PATELLECTOMY Right 09/14/2013   Procedure: PATELLECTOMY;  Surgeon: Meredith Pel, MD;  Location: [place];  Service: Orthopedics;  Laterality: Right;  . PROCTOSCOPY N/A 10/13/2014   Procedure: RIDGE PROCTOSCOPY;  Surgeon: [name], MD;  [facility];  Service: General;  Laterality: N/A;  . REVISION TOTAL KNEE ARTHROPLASTY Bilateral right  04-02-2011/  left 1996 & 10-05-1999  . ROTATOR CUFF REPAIR Bilateral   . TONSILLECTOMY    . TOTAL KNEE ARTHROPLASTY Bilateral left 1994/  right 2000  . TOTAL KNEE REVISION Left 08/02/2015   Procedure: LEFT FEMORAL REVISION;  Surgeon: Gaynelle Arabian, MD;  [facility];  Service: Orthopedics;  Laterality: Left;  . TRANSTHORACIC ECHOCARDIOGRAM  12-01-2009   Grade I diastolic dysfunction/  ef 60%/  moderate MR/  mild TR   Family History  Problem Relation Age of Onset  . Osteoarthritis Mother   . Heart failure Mother   . Colon cancer Maternal Uncle 56  . Heart Problems Father   . Cancer Sister 28       ? colon cancer   . Kidney cancer Brother 59  . Esophageal cancer Neg Hx   . Rectal cancer Neg Hx   . Stomach cancer Neg Hx   . Pancreatic cancer Neg Hx    Social History   Socioeconomic History  . Marital status: Widowed    Spouse name: Not on file  . Number of children: Not on file  . Years of education: Not on file    .  Highest education level: Not on file  Occupational History  . Not on file  Tobacco Use  . Smoking status: Former Smoker    Packs/day: 0.50    Years: 16.00    Pack years: 8.00    Types: Cigarettes    Start date: 36    Quit date: 07/16/1967    Years since quitting: 52.8  . Smokeless tobacco: Never Used  Vaping Use  . Vaping Use: Never used  Substance and Sexual Activity  . Alcohol use: No    Alcohol/week: 0.0 standard drinks  . Drug use: No  . Sexual activity: Not on file  Other Topics Concern  . Not on file  Social History Narrative   Married, 2 children.  As of November 2015 her husband has been living in a nursing home for tendon after years as he suffers from multiple medical problems.   Patient does not drink alcohol nor does she smoke or chew tobacco products   Right handed   8th grade   1 cup daily   Social Determinants of Health   Financial Resource Strain: Low Risk   . Difficulty of Paying Living Expenses: Not hard at all  Food Insecurity: No Food Insecurity  . Worried About Charity fundraiser in the Last Year: Never true  . Ran Out of Food in the Last Year: Never true  Transportation Needs: No Transportation Needs  . Lack of Transportation (Medical): No  . Lack of Transportation (Non-Medical): No  Physical Activity: Inactive  . Days of Exercise per Week: 0 days  . Minutes of Exercise per Session: 0 min  Stress: No Stress Concern Present  . Feeling of Stress : Not at all  Social Connections: Moderately Isolated  . Frequency of Communication with Friends and Family: More than three times a week  . Frequency of Social Gatherings with Friends and Family: Once a week  . Attends Religious Services: 1 to 4 times per year  . Active Member of Clubs or Organizations: No  . Attends Archivist Meetings: Never  . Marital Status: Widowed    Tobacco Counseling Counseling given: Not Answered   Clinical Intake:  Pre-visit preparation completed:  Yes  Pain : 0-10 Pain Score: 4  Pain Type: Chronic pain Pain Location: Back (arm and knees) Pain Descriptors / Indicators: Aching Pain Onset: More than a month ago Pain Frequency: Constant     BMI - recorded: 30.9 Nutritional Status: BMI > 30  Obese Nutritional Risks: None Diabetes: No  How often do you need to have someone help you when you read instructions, pamphlets, or other written materials from your doctor or pharmacy?: 1 - Never  Diabetic?no  Interpreter Needed?: No  Information entered by :: Charlott Rakes, LPN   Activities of Daily Living In your present state of health, do you have any difficulty performing the following activities: 05/24/2020 08/03/2019  Hearing? Y N  Comment wearing hearing aids -  Vision? N N  Difficulty concentrating or making decisions? N N  Walking or climbing stairs? N Y  Comment has a ramp secondary to weakness  Dressing or bathing? N N  Doing errands, shopping? N Y  Comment - -  Conservation officer, nature and eating ? N -  Using the Toilet? N -  In the past six months, have you accidently leaked urine? Y -  Comment occassional -  Do you have problems with loss of bowel control? N -  Managing your Medications? N -  Managing your  Finances? N -  Housekeeping or managing your Housekeeping? N -  Some recent data might be hidden    Patient Care Team: [name], NP as PCP - General (Family Medicine) [name], Wonda Cheng, MD as PCP - Cardiology (Cardiology) Dimitri Ped, RN as [place] Management Lahoma Rocker, MD as Consulting Physician (Rheumatology) Viona Gilmore, [facility] as Pharmacist (Pharmacist)  Indicate any recent Medical Services you [place] have received from other than [facility] providers in the past year (date Jalomo be approximate).     Assessment:   This is a routine wellness examination for [name].  Hearing/Vision screen  Hearing Screening   '125Hz'$  $Remo'250Hz'yPniu$'500Hz'$'1000Hz'$'2000Hz'$'3000Hz'$'4000Hz'$'6000Hz'$'8000Hz'$    Right ear:           Left ear:           Comments: Wears hearing aids   Vision Screening Comments: Pt follows up with Dr Katy Fitch now following up with provider Dr Bing Plume [place] [address] 05/31/20  Dietary issues and exercise activities discussed: Current Exercise Habits: The patient does not participate in regular exercise at present, Exercise limited by: orthopedic condition(s)  Goals    . Client will not report change from baseline and no repeated symptoms of stroke with in the next 6 months (continue 01/25/20)     Reports no stroke symptoms Reviewed stoke symptoms and when to call 911    . Client will report no fall or injuries in the next 6 months.(continue 7/134/21)     Reports no falls Stand up slowly Use cane or walker at all times Use grabber to pick up objects on the floor Keep home well-lit and wear your glasses    . Client will report no worsening of symptoms of Atrial Fibrillation within the next 6 months(continue 01/25/20)     No worsening of symptoms and taking anticoagulant as directed Reviewed signs, symptoms and monitoring for worsening atrial fibrillation Reviewed atrial fibrillation action plan Reviewed importance of taking anticoagulant to prevent stroke    . Client will report no worsening of symptoms related to heart disease within the next 6 months(continue 01/25/20)     No worsening of heart disease symptoms Notify provider for symptoms of chest pain, sweating, nausea/vomiting, irregular heartbeat, palpitations, rapid heart rate, shortness of breath or dizziness or fainting. Call 911 for severe symptoms of chest pain or shortness of breath. Take medications as prescribed     . Client will report the importance of monitoring weight for Heart Failure management as evidenced by verbalizing signs of exacerbation.     No admissions or ED visits for heart failure Signs and symptoms of heart failure reviewed Education provided for self-management of Heart  Disease. Advised to notify doctor for symptoms Call 911 for severe symptoms Weigh daily and record weighs Follow a low salt diet    . Client will verbalize knowledge of chronic lung disease as evidenced by no ED visits or Inpatient stays related to chronic lung disease      No ED or hospitalization for lung disease Reviewed use of inhalers Contact your provider for any signs and symptoms of mild shortness of breath Call 911 if you are having severe shortness of breath    . Client will verbalize knowledge of self management of Hypertension as evidences by BP reading of 140/90 or less; or as defined by provider     Reports B/P less than 140/90 when checking  Plan to check B/P regularly Take B/P medications as  ordered Plan to follow a low salt diet  Increase activity as tolerated    . Client will verbalize understanding of treatment plan for impaired skin integrity(rt leg cellulitis) and follow up with provider by next 6 months     Reinforced signs and symptoms to call provider Keep legs elevated Wear compression hose as directed Keep follow up appointments  with provider Call provider for increased swelling, redness or open skin    . Decrease inpatient Heart Failure admissions/ readmissions with in the year     No heart failure admissions    . Decrease the use of hospital emergency department related to heart failure within the next year     No heart failure admission/ED visits Call [facility]) Nurse Advise Line [phone] if needed    . Maintain timely refills of Heart Failure medication as prescribed within the year      Maintaining timely refills of medications per dispense report Reinforced importance of getting medications refilled on time    . Obtain annual  Lipid Profile, LDL-C     No recent lipid profile The goal for LDL is less than 70 mg/dL as you are at high risk for complications Try to avoid saturated fats, trans-fats and eat more fiber     .  Patient Stated     Be able to take of myself    . Patient Stated goal to weigh 2-3 times a week and record weights for the next 6 months     Reports weighing at least 3-4 times a week Reinforced importance of weighting daily and to record weights Reinforced heart failure zones and when to call MD    . [place] (see longitudinal plan of care for additional care plan information)  Current Barriers:  . Chronic Disease Management support, education, and care coordination needs related to Hypertension, Hyperlipidemia, Atrial Fibrillation, Heart Failure, and Pulmonary emphysema    Atrial fibrillation (Afib) . Pharmacist Clinical Goal(s) o Over the next 90 days, patient will work with PharmD and providers to maintain normal sinus rhythm.  . Current regimen:  o metoprolol tartrate 50mg , 1.5 (one and one-half) tablets twice daily o apixaban (Eliquis) 5mg  1 tablet twice daily  . Interventions: o We discussed:  monitoring for signs and symptoms for bleeding (coughing up blood, prolonged nose bleeds, black, tarry stools). . Patient self care activities o Patient will continue current medications as prescribed.   Diastolic heart failure . Pharmacist Clinical Goal(s) o Over the next 90 days, patient will work with PharmD and providers to maintain weight stable and check weight daily.  . Current regimen:   benazepril 40mg , 1 tablet once daily   metoprolol tartrate 50mg , 1.5 (one and one-half) tablets twice daily  furosemide 40mg , 1 tablet once daily . Interventions: o We discussed weighing daily; if you gain more than 3 pounds in one day or 5 pounds in one week call your doctor . Patient self care activities o Patient will take medications as prescribed and monitor weight.   Hypertension BP Readings from Last 3 Encounters:  01/21/20 128/72  12/27/19 139/83  12/09/19 124/74   . Pharmacist Clinical Goal(s): o Over the next 90 days, patient will work with  PharmD and providers to maintain BP goal <130/80 . Current regimen:   benazepril 40mg , 1 tablet once daily  metoprolol tartrate 50mg , 1.5 (one and one-half) tablets twice daily . Patient self care activities - Over the next 43  days, patient will: o Check blood pressure daily, document, and provide at future appointments o Ensure daily salt intake < 2300 mg/day o Check blood pressure when feeling lightheaded or dizzy.  Hyperlipidemia Lab Results  Component Value Date/Time   LDLCALC 79 01/19/2013 10:14 AM   . Pharmacist Clinical Goal(s): o Over the next 90 days, patient will work with PharmD and providers to achieve LDL goal < 70 . Current regimen:  o No medications.  . Interventions: o Overdue for lipid panel (cholesterol blood work) . Patient self care activities - Over the next 60 days, patient will: o Obtain cholesterol blood work.  COPD . Pharmacist Clinical Goal(s) o Over the next 90 days, patient will work with PharmD and providers to prevent worsening of shortness of breath and hospitalizations. . Current regimen:   tiotropium (Spiriva Respimat) 2.5 mcg/ act, inhale 2 puffs into lungs daily   albuterol (Proair HFA) 108 mcg/act inhaler, 2 puffs every six hours as needed for wheezing or shortness of breath   Flovent HFA 44 mcg, inhale 1 puff into the lungs twice daily . Patient self care activities - Over the next 90 days, patient will: o Contact pulmonologist office and schedule office visit.   Medication management . Pharmacist Clinical Goal(s): o Over the next 90 days, patient will work with PharmD and providers to maintain optimal medication adherence . Current pharmacy: [facility] . Interventions o Comprehensive medication review performed. o Utilize [facility] for medication synchronization, packaging and delivery . Patient self care activities - Over the next 90 days, patient will: o Take medications as prescribed o Report any questions or  concerns to PharmD and/or provider(s)  Please see past updates related to this goal by clicking on the "Past Updates" button in the selected goal       . Visit Primary Care Provider or Cardiologist at least 2 times per year     Primary care provider 08/13/19, 11/03/19 Cardiologist 12/09/19 Please schedule your annual wellness visit      Depression Screen PHQ 2/9 Scores 05/24/2020 01/25/2020 07/28/2019 03/30/2019 10/28/2018 11/27/2017 10/23/2016  PHQ - 2 Score 0 0 0 0 0 0 0    Fall Risk Fall Risk  05/24/2020 12/17/2017 12/04/2017 11/27/2017 11/06/2017  Falls in the past year? 1 (No Data) (No Data) Yes (No Data)  Comment - No new/ recent falls, per report of patient's daughter/ caregiver, on [facility] CM written consent No new/ recent falls, per patient's caregiver/ daughter, on [facility] CM written consent - per report of patient and patient's caregiver, [name], on [facility] CM written consent, no new/ recent falls post-hospital discharge  Number falls in past yr: 1 - - 2 or more -  Injury with Fall? 0 - - Yes -  Comment - - - per patient report, minor bruising and injuries, not requiring hospitalization or ED visits -  Risk Factor Category  - - - High Fall Risk -  Risk for fall due to : History of fall(s);Impaired balance/gait;Impaired mobility;Impaired vision - - History of fall(s);Impaired mobility;Medication side effect Medication side effect;Impaired mobility  Follow up Falls prevention discussed - Falls prevention discussed Falls evaluation completed;Education provided;Falls prevention discussed -    Any stairs in or around the home? No  If so, are there any without handrails? No  Home free of loose throw rugs in walkways, pet beds, electrical cords, etc? Yes  Adequate lighting in your home to reduce risk of falls? Yes   ASSISTIVE DEVICES UTILIZED TO PREVENT FALLS:  Life  alert? Yes  Use of a cane, walker or w/c? Yes  Grab bars in the bathroom? Yes  Shower chair or bench in shower? Yes  Elevated toilet  seat or a handicapped toilet? Yes   TIMED UP AND GO:  Was the test performed? No .     Cognitive Function:     6CIT Screen 05/24/2020  What Year? 0 points  What month? 0 points  Count back from 20 0 points  Months in reverse 0 points  Repeat phrase 2 points    Immunizations Immunization History  Administered Date(s) Administered  . Fluad Quad(high Dose 65+) 04/05/2019  . Influenza Split 06/21/2011, 04/21/2012  . Influenza Whole 05/19/2007, 04/05/2008, 04/26/2010  . Influenza, High Dose Seasonal PF 05/17/2015, 05/22/2016, 04/11/2017, 04/01/2018  . Influenza,inj,Quad PF,6+ Mos 03/10/2013  . Influenza-Unspecified 04/14/2014, 04/29/2020  . PFIZER SARS-COV-2 Vaccination 02/02/2020, 02/23/2020  . PPD Test 06/10/2014, 06/24/2014, 08/18/2015  . Pneumococcal Conjugate-13 05/17/2015  . Pneumococcal Polysaccharide-23 02/10/2013  . Tdap 11/19/2010    TDAP status: Up to date Flu Vaccine status: Up to date Pneumococcal vaccine status: Up to date Covid-19 vaccine status: Completed vaccines  Qualifies for Shingles Vaccine? Yes   Zostavax completed No   Shingrix Completed?: No.    Education has been provided regarding the importance of this vaccine. Patient has been advised to call insurance company to determine out of pocket expense if they have not yet received this vaccine. Advised Mag[place] also receive vaccine at local pharmacy or Health Dept. Verbalized acceptance and understanding.  Screening Tests Health Maintenance  Topic Date Due  . TETANUS/TDAP  11/18/2020  . INFLUENZA VACCINE  Completed  . DEXA SCAN  Completed  . COVID-19 Vaccine  Completed  . PNA vac Low Risk Adult  Completed    Health Maintenance  There are no preventive care reminders to display for this patient.  Colorectal cancer screening: No longer required.  Mammogram: no longer required Bone Density status: Completed 09/23/17. Results reflect: Bone density results: OSTEOPOROSIS. Repeat every 2  years.   Additional Screening:   Vision Screening: Recommended annual ophthalmology exams for early detection of glaucoma and other disorders of the eye. Is the patient up to date with their annual eye exam?  Yes  Who is the provider or what is the name of the office in which the patient attends annual eye exams? [facility] will follow up   Dental Screening: Recommended annual dental exams for proper oral hygiene  Community Resource Referral / Chronic Care Management: CRR required this visit?  No   CCM required this visit?  No      Plan:     I have personally reviewed and noted the following in the patient's chart:   . Medical and social history . Use of alcohol, tobacco or illicit drugs  . Current medications and supplements . Functional ability and status . Nutritional status . Physical activity . Advanced directives . List of other physicians . Hospitalizations, surgeries, and ER visits in previous 12 months . Vitals . Screenings to include cognitive, depression, and falls . Referrals and appointments  In addition, I have reviewed and discussed with patient certain preventive protocols, quality metrics, and best practice recommendations. A written personalized care plan for preventive services as well as general preventive health recommendations were provided to patient.     Willette Brace, LPN   [id number]   Nurse Notes: None   I have collaborated with the care management provider regarding care management and care coordination activities  outlined in this encounter and have reviewed this encounter including documentation in the note and care plan. I am certifying that I agree with the content of this note and encounter as supervising physician.  [name], NP

## 2020-05-31 DIAGNOSIS — Z961 Presence of intraocular lens: Secondary | ICD-10-CM | POA: Diagnosis not present

## 2020-05-31 DIAGNOSIS — H35033 Hypertensive retinopathy, bilateral: Secondary | ICD-10-CM | POA: Diagnosis not present

## 2020-05-31 DIAGNOSIS — H35373 Puckering of macula, bilateral: Secondary | ICD-10-CM | POA: Diagnosis not present

## 2020-05-31 DIAGNOSIS — H40023 Open angle with borderline findings, high risk, bilateral: Secondary | ICD-10-CM | POA: Diagnosis not present

## 2020-06-06 ENCOUNTER — Ambulatory Visit: Payer: HMO | Admitting: Cardiovascular Disease

## 2020-06-06 ENCOUNTER — Encounter: Payer: Self-pay | Admitting: Cardiovascular Disease

## 2020-06-06 ENCOUNTER — Other Ambulatory Visit: Payer: Self-pay

## 2020-06-06 VITALS — BP 138/80 | HR 86 | Ht 64.0 in | Wt 190.6 lb

## 2020-06-06 DIAGNOSIS — I4819 Other persistent atrial fibrillation: Secondary | ICD-10-CM

## 2020-06-06 DIAGNOSIS — I5032 Chronic diastolic (congestive) heart failure: Secondary | ICD-10-CM

## 2020-06-06 DIAGNOSIS — I2510 Atherosclerotic heart disease of native coronary artery without angina pectoris: Secondary | ICD-10-CM

## 2020-06-06 DIAGNOSIS — I482 Chronic atrial fibrillation, unspecified: Secondary | ICD-10-CM | POA: Diagnosis not present

## 2020-06-06 LAB — CBC WITH DIFFERENTIAL/PLATELET
Basophils Absolute: 0 10*3/uL (ref 0.0–0.2)
Basos: 0 %
EOS (ABSOLUTE): 0.1 10*3/uL (ref 0.0–0.4)
Eos: 1 %
Hematocrit: 35.8 % (ref 34.0–46.6)
Hemoglobin: 12.1 g/dL (ref 11.1–15.9)
Immature Grans (Abs): 0 10*3/uL (ref 0.0–0.1)
Immature Granulocytes: 0 %
Lymphocytes Absolute: 1.3 10*3/uL (ref 0.7–3.1)
Lymphs: 17 %
MCH: 31.2 pg (ref 26.6–33.0)
MCHC: 33.8 g/dL (ref 31.5–35.7)
MCV: 92 fL (ref 79–97)
Monocytes Absolute: 0.7 10*3/uL (ref 0.1–0.9)
Monocytes: 9 %
Neutrophils Absolute: 5.6 10*3/uL (ref 1.4–7.0)
Neutrophils: 73 %
Platelets: 160 10*3/uL (ref 150–450)
RBC: 3.88 x10E6/uL (ref 3.77–5.28)
RDW: 13.8 % (ref 11.7–15.4)
WBC: 7.6 10*3/uL (ref 3.4–10.8)

## 2020-06-06 LAB — BASIC METABOLIC PANEL
BUN/Creatinine Ratio: 33 — ABNORMAL HIGH (ref 12–28)
BUN: 48 mg/dL — ABNORMAL HIGH (ref 8–27)
CO2: 24 mmol/L (ref 20–29)
Calcium: 9.4 mg/dL (ref 8.7–10.3)
Chloride: 105 mmol/L (ref 96–106)
Creatinine, Ser: 1.46 mg/dL — ABNORMAL HIGH (ref 0.57–1.00)
GFR calc Af Amer: 37 mL/min/{1.73_m2} — ABNORMAL LOW (ref >59)
GFR calc non Af Amer: 32 mL/min/{1.73_m2} — ABNORMAL LOW (ref >59)
Glucose: 109 mg/dL — ABNORMAL HIGH (ref 65–99)
Potassium: 4.8 mmol/L (ref 3.5–5.2)
Sodium: 143 mmol/L (ref 134–144)

## 2020-06-06 LAB — TSH: TSH: 2.62 u[IU]/mL (ref 0.450–4.500)

## 2020-06-06 NOTE — Progress Notes (Signed)
Cardiology Office Note:    Date:  11/23/[name]   ID:  [name], DOB 1931-01-[name], MRN [id number]  PCP:  [name], NP  Cardiologist:  Mertie Moores, MD  Electrophysiologist:  None  Referring MD: [name], NP   Problem list 1.  Coronary artery disease-status post stenting of her right coronary artery in 2009 2.  Chronic diastolic congestive heart failure 3.  Mitral regurgitation 4.  Hypertension 5.  Hyperlipidemia 6.  Persistent atrial fibrillation 7.  COPD 8.  Pulmonary hypertension 9.  Hx of DVT 10.  History of colon cancer  Chief Complaint  Patient presents with  . Atrial Fibrillation  . Congestive Heart Failure        [name] is a 86 y.o. female with a hx of coronary artery disease.  She is status post stenting of her right coronary artery x2 in 2009.  She has a history of chronic diastolic congestive heart failure.  She was admitted to the hospital in April, 2019 for acute on chronic congestive heart failure.  She was found to be anemic and had heme positive stools.  Has had pneumonia  Has had a cough for the past several weeks  Is more short of breath  Had a CT scan this am   Nov. 25, 2020:  No cardiac complaints Has difficulty swallowing  Food gets stuck -  Advised her to try warm water to help wash down  Breathing is ok .   Gets some shortness of breath with walking  Tries to avoid salty foods.   [name], [name]: [name] is seen today for follow upu of her  atrial fib, CAD, CHF, Anemia   Nov. 23, [name]: [name] is seen for fllow up of her atrial fib, CAD, CHF Has not been feeling well.   Not hurting . Feels weak Gets out of breath with minimal walking  Has difficulty swallowing ,  Coughs frequently while eating    Past Medical History:  Diagnosis Date  . Adenocarcinoma, colon [facility]) Oncologist-- Dr Truitt Merle   Multifocal (2) Colon cancer @ ileocecal valve and ascending ( [id number]), Stage IIIB, grade I, MMR normal , 2 of 16 +lymph nodes,  negative surgical margins---  06-02-2014  Right hemicolectomy w/ colostomy  . Allergy   . Anxiety   . Asthma    inhaler "sometimes"  . CAD (coronary artery disease)    a. LHC 4/09: pLAD 40, mDx 70-75, pLCx 30, mLCx 50, mRCA 90, EF 60% >> PCI: BMS x2 to RCA;  b. Myoview 8/12: normal  . Cataract    bil cateracts removed  . Chronic anemia   . Chronic diastolic CHF (congestive heart failure) (HCC)    a. Echo 912 - Mild LVH, EF 55-60%, no RWMA, Gr 1 DD, mild MR, mild LAE, mild RAE, PASP 59 mmHg (mod to severe pulmo HTN)  . Clotting disorder (HCC)    hx of dvt, tia on eliquis  . Colostomy in place [facility])    s/p colostomy takedown 5/16  . Colovaginal fistula    s/p colostomy >> colostomy takedown 5/16  . COPD with chronic bronchitis ([place])   . Depression   . Dysrhythmia    A fib  . Essential hypertension   . GERD (gastroesophageal reflux disease)   . History of adenomatous polyp of colon   . History of blood transfusion   . History of DVT of lower extremity   . History of hiatal hernia   . History of TIA (  transient ischemic attack)    a. Head CT 6/15: small chronic lacunar infarct in thalamus  . Hyperlipidemia   . OA (osteoarthritis)   . Osteoporosis   . Peripheral neuropathy   . Pneumonia   . Pulmonary HTN ([place])    a. PASP on Echo in 9/12:  59 mmHg  . Renal insufficiency   . Sigmoid diverticulosis    s/p sigmoid colectomy  . Stroke ([place])    TIAs  . Wears dentures   . Wears glasses     Past Surgical History:  Procedure Laterality Date  . ABDOMINAL HYSTERECTOMY    . ANTERIOR CERVICAL DECOMP/DISCECTOMY FUSION  01-31-2009   C5 -- 7  . APPENDECTOMY    . Bone spurs Bilateral    Feet  . CARDIOVASCULAR STRESS TEST  02-26-2011   Normal lexiscan no exercise study/  no ischemia/  normal LV function and wall motion, ef 67%  . CARPAL TUNNEL RELEASE Right   . CATARACT EXTRACTION W/ INTRAOCULAR LENS  IMPLANT, BILATERAL Bilateral   . CHOLECYSTECTOMY    . COLOSTOMY N/A 06/02/2014    Procedure: DIVERTING DESCENDING END COLOSTOMY;  Surgeon: Donnie Mesa, MD;  Location: [place];  Service: General;  Laterality: N/A;  . CORONARY ANGIOPLASTY WITH STENT PLACEMENT  11-04-2007  dr bensimhon   BMS x2 to RCA/  mild Non-obstructive disease LAD/  normal LVF  . DILATION AND CURETTAGE OF UTERUS    . ESOPHAGOGASTRODUODENOSCOPY N/A 10/28/2017   Procedure: ESOPHAGOGASTRODUODENOSCOPY (EGD);  Surgeon: Yetta Flock, MD;  Location: [facility]COPY;  Service: Gastroenterology;  Laterality: N/A;  . EVALUATION UNDER ANESTHESIA WITH ANAL FISTULECTOMY N/A 10/13/2014   Procedure: ANAL EXAM UNDER ANESTHESIA ;  Surgeon: [name], MD;  Location: [facility];  Service: General;  Laterality: N/A;  . HAND SURGERY     Tendon repair  . LAPAROSCOPIC SIGMOID COLECTOMY N/A 11/24/2014   Procedure: SIGMOID COLECTOMY AND COLSOTOMY CLOSURE;  Surgeon: Donnie Mesa, MD;  Location: [place];  Service: General;  Laterality: N/A;  . LUMBAR LAMINECTOMY  10-29-2002,  1969   Left  L3 -- 4  decompression  . PARTIAL COLECTOMY N/A 06/02/2014   Procedure: RIGHT PARTIAL COLECTOMY ;  Surgeon: Donnie Mesa, MD;  Location: [place];  Service: General;  Laterality: N/A;  . PATELLECTOMY Right 09/14/2013   Procedure: PATELLECTOMY;  Surgeon: Meredith Pel, MD;  Location: [place];  Service: Orthopedics;  Laterality: Right;  . PROCTOSCOPY N/A 10/13/2014   Procedure: RIDGE PROCTOSCOPY;  Surgeon: [name], MD;  Location: [facility];  Service: General;  Laterality: N/A;  . REVISION TOTAL KNEE ARTHROPLASTY Bilateral right  04-02-2011/  left 1996 & 10-05-1999  . ROTATOR CUFF REPAIR Bilateral   . TONSILLECTOMY    . TOTAL KNEE ARTHROPLASTY Bilateral left 1994/  right 2000  . TOTAL KNEE REVISION Left 08/02/2015   Procedure: LEFT FEMORAL REVISION;  Surgeon: Gaynelle Arabian, MD;  Location: [facility];  Service: Orthopedics;  Laterality: Left;  . TRANSTHORACIC ECHOCARDIOGRAM  12-01-2009   Grade I diastolic dysfunction/  ef 60%/  moderate MR/  mild TR      Current Medications: Current Meds  Medication Sig  . acetaminophen (TYLENOL) 500 MG tablet Take 1,000 mg by mouth every 6 (six) hours as needed for moderate pain.   Marland Kitchen albuterol (PROAIR HFA) 108 (90 Base) MCG/ACT inhaler Inhale 2 puffs into the lungs every 6 (six) hours as needed for wheezing or shortness of breath.  . allopurinol (ZYLOPRIM) 100 MG tablet Take 1 tablet (100 mg total)  by mouth daily.  Marland Kitchen apixaban (ELIQUIS) 5 MG TABS tablet Take 1 tablet (5 mg total) by mouth 2 (two) times daily.  . Ascorbic Acid (VITAMIN C) 1000 MG tablet Take 1,000 mg by mouth daily.  . benazepril (LOTENSIN) 40 MG tablet TAKE ONE TABLET BY MOUTH ONCE DAILY  . esomeprazole (NEXIUM) 40 MG capsule TAKE ONE CAPSULE BY MOUTH ONCE DAILY  . famotidine (PEPCID) 20 MG tablet TAKE ONE TABLET BY MOUTH EVERYDAY AT BEDTIME  . ferrous sulfate 325 (65 FE) MG EC tablet Take 325 mg by mouth daily with breakfast.  . fluticasone (FLOVENT HFA) 44 MCG/ACT inhaler Inhale 1 puff into the lungs in the morning and at bedtime.  . furosemide (LASIX) 20 MG tablet Take 2 tablets (40 mg total) by mouth daily.  Marland Kitchen gabapentin (NEURONTIN) 600 MG tablet Take 1 tablet (600 mg total) by mouth 2 (two) times daily.  . hydrocortisone (ANUSOL-HC) 2.5 % rectal cream Place 1 application rectally 2 (two) times daily.  Marland Kitchen LORazepam (ATIVAN) 0.5 MG tablet TAKE ONE TABLET BY MOUTH EVERYDAY AT BEDTIME  . metoprolol tartrate (LOPRESSOR) 50 MG tablet Take 1.5 tablets (75 mg total) by mouth 2 (two) times daily.  . nitroGLYCERIN (NITROSTAT) 0.4 MG SL tablet Place 1 tablet (0.4 mg total) under the tongue every 5 (five) minutes as needed for chest pain (x 3 pills). Reported on 09/01/2015  . sertraline (ZOLOFT) 50 MG tablet Take 1 tablet (50 mg total) by mouth daily.  . silver sulfADIAZINE (SILVADENE) 1 % cream SMARTSIG:1 Sparingly Topical Twice Daily  . Spacer/Aero-Holding Chambers (E-Z SPACER) inhaler Use as instructed  . Tiotropium Bromide Monohydrate (SPIRIVA  RESPIMAT) 2.5 MCG/ACT AERS Inhale 2 puffs into the lungs daily.  . traZODone (DESYREL) 50 MG tablet Take 1 tablet (50 mg total) by mouth at bedtime.  . triamcinolone cream (KENALOG) 0.1 % Apply 1 application topically 2 (two) times daily.      Allergies:   Patient has no known allergies.   Social History   Socioeconomic History  . Marital status: Widowed    Spouse name: Not on file  . Number of children: Not on file  . Years of education: Not on file  . Highest education level: Not on file  Occupational History  . Not on file  Tobacco Use  . Smoking status: Former Smoker    Packs/day: 0.50    Years: 16.00    Pack years: 8.00    Types: Cigarettes    Start date: 15    Quit date: 07/16/1967    Years since quitting: 52.9  . Smokeless tobacco: Never Used  Vaping Use  . Vaping Use: Never used  Substance and Sexual Activity  . Alcohol use: No    Alcohol/week: 0.0 standard drinks  . Drug use: No  . Sexual activity: Not on file  Other Topics Concern  . Not on file  Social History Narrative   Married, 2 children.  As of November 2015 her husband has been living in a nursing home for tendon after years as he suffers from multiple medical problems.   Patient does not drink alcohol nor does she smoke or chew tobacco products   Right handed   8th grade   1 cup daily   Social Determinants of Health   Financial Resource Strain: Low Risk   . Difficulty of Paying Living Expenses: Not hard at all  Food Insecurity: No Food Insecurity  . Worried About Programme researcher, broadcasting/film/video in the Last Year: Never  true  . Ran Out of Food in the Last Year: Never true  Transportation Needs: No Transportation Needs  . Lack of Transportation (Medical): No  . Lack of Transportation (Non-Medical): No  Physical Activity: Inactive  . Days of Exercise per Week: 0 days  . Minutes of Exercise per Session: 0 min  Stress: No Stress Concern Present  . Feeling of Stress : Not at all  Social Connections: Moderately  Isolated  . Frequency of Communication with Friends and Family: More than three times a week  . Frequency of Social Gatherings with Friends and Family: Once a week  . Attends Religious Services: 1 to 4 times per year  . Active Member of Clubs or Organizations: No  . Attends Archivist Meetings: Never  . Marital Status: Widowed     Family History: The patient's family history includes Cancer (age of onset: 60) in her sister; Colon cancer (age of onset: 77) in her maternal uncle; Heart Problems in her father; Heart failure in her mother; Kidney cancer (age of onset: 83) in her brother; Osteoarthritis in her mother. There is no history of Esophageal cancer, Rectal cancer, Stomach cancer, or Pancreatic cancer.  ROS:   Please see the history of present illness.     All other systems reviewed and are negative.  EKGs/Labs/Other Studies Reviewed:    The following studies were reviewed today:    Recent Labs: 1/18/[name]: B Natriuretic Peptide 345.2 1/19/[name]: Magnesium 2.1 2/22/[name]: ALT 13; Hemoglobin 12.8; Platelets 109 5/[name]/[name]: BUN 34; Creatinine, Ser 1.38; Potassium 4.5; Sodium 141  Recent Lipid Panel    Component Value Date/Time   CHOL 130 01/19/2013 1014   TRIG 87.0 01/19/2013 1014   HDL 33.60 (L) 01/19/2013 1014   CHOLHDL 4 01/19/2013 1014   VLDL 17.4 01/19/2013 1014   LDLCALC 79 01/19/2013 1014    Physical Exam:    Physical Exam: Blood pressure 138/80, pulse 86, height $RemoveBe'5\' 4"'sYtMHamBn$  (1.626 m), weight 190 lb 9.6 oz (86.5 kg), SpO2 96 %.  GEN:  Elderly female, mildly obese, very weak.   Could not get up on exam table  HEENT: Normal NECK: No JVD; No carotid bruits LYMPHATICS: No lymphadenopathy CARDIAC: irregl. Irreg. , no murmurs, rubs, gallops RESPIRATORY:  Clear to auscultation without rales, wheezing or rhonchi  ABDOMEN: Soft, non-tender, non-distended MUSCULOSKELETAL:  No edema; No deformity  SKIN: Warm and dry NEUROLOGIC:  Alert and oriented x 3    EKG:      January 6, [name]: Atrial fibrillation with heart rate of 79.  No ST or T wave changes.  ASSESSMENT:    1. Persistent atrial fibrillation ([place])    PLAN:      1.   Dyspnea:   She complains of generalized fatigue along with dyspnea.  And watching her get up from the seat is clear that she is extremely weak.  She was not able to climb up on the exam table.  I think that her profound weakness and shortness of breath is due to her advanced age and generalized weakness.  We will we will check a TSH, BMP , CBC today. I do not think her weakness and dyspnea have a primary cardiac etiology    2.  Coronary artery disease: no angina .    No angina   3.  Chronic diastolic congestive heart failure:   Stable , cont lasix , Afib with rate control , metoprolol   4.  Chronic atrial fibrillation:   HR is stable .  Cont eliquis   Medication Adjustments/Labs and Tests Ordered: Current medicines are reviewed at length with the patient today.  Concerns regarding medicines are outlined above.  Orders Placed This Encounter  Procedures  . CBC with Differential/Platelet  . Basic metabolic panel  . TSH   No orders of the defined types were placed in this encounter.   Patient Instructions  Medication Instructions:  Your provider recommends that you continue on your current medications as directed. Please refer to the Current Medication list given to you today.   *If you need a refill on your cardiac medications before your next appointment, please call your pharmacy*  Labs: TODAY! BMET, CBC, TSH  Follow-Up: At [facility], you and your health needs are our priority.  As part of our continuing mission to provide you with exceptional heart care, we have created designated Provider Care Teams.  These Care Teams include your primary Cardiologist (physician) and Advanced Practice Providers (APPs -  Physician Assistants and Nurse Practitioners) who all work together to provide you with the care you need,  when you need it. Your next appointment:   12 month(s) The format for your next appointment:   In Person Provider:   You [place] see Mertie Moores, MD or one of the following Advanced Practice Providers on your designated Care Team:    Richardson Dopp, PA-C  Robbie Lis, [place]      Signed, Mertie Moores, MD  11/23/[name] 11:32 AM    [place]

## 2020-06-06 NOTE — Patient Instructions (Signed)
Medication Instructions:  Your provider recommends that you continue on your current medications as directed. Please refer to the Current Medication list given to you today.   *If you need a refill on your cardiac medications before your next appointment, please call your pharmacy*  Labs: TODAY! BMET, CBC, TSH  Follow-Up: At [facility], you and your health needs are our priority.  As part of our continuing mission to provide you with exceptional heart care, we have created designated Provider Care Teams.  These Care Teams include your primary Cardiologist (physician) and Advanced Practice Providers (APPs -  Physician Assistants and Nurse Practitioners) who all work together to provide you with the care you need, when you need it. Your next appointment:   12 month(s) The format for your next appointment:   In Person Provider:   You [place] see Mertie Moores, MD or one of the following Advanced Practice Providers on your designated Care Team:    Richardson Dopp, PA-C  [place], [place]

## 2020-06-15 ENCOUNTER — Telehealth: Payer: Self-pay | Admitting: Pharmacist

## 2020-06-15 NOTE — Chronic Care Management (AMB) (Signed)
Chronic Care Management Pharmacy Assistant   Name: [name]  MRN: [id number] DOB: 21-Jul-1929  Reason for Encounter: Medication Review  PCP : [name], NP  Allergies:  No Known Allergies  Medications: Outpatient Encounter Medications as of 06/15/2020  Medication Sig  . acetaminophen (TYLENOL) 500 MG tablet Take 1,000 mg by mouth every 6 (six) hours as needed for moderate pain.   Marland Kitchen albuterol (PROAIR HFA) 108 (90 Base) MCG/ACT inhaler Inhale 2 puffs into the lungs every 6 (six) hours as needed for wheezing or shortness of breath.  . allopurinol (ZYLOPRIM) 100 MG tablet Take 1 tablet (100 mg total) by mouth daily.  Marland Kitchen apixaban (ELIQUIS) 5 MG TABS tablet Take 1 tablet (5 mg total) by mouth 2 (two) times daily.  . Ascorbic Acid (VITAMIN C) 1000 MG tablet Take 1,000 mg by mouth daily.  . benazepril (LOTENSIN) 40 MG tablet TAKE ONE TABLET BY MOUTH ONCE DAILY  . esomeprazole (NEXIUM) 40 MG capsule TAKE ONE CAPSULE BY MOUTH ONCE DAILY  . famotidine (PEPCID) 20 MG tablet TAKE ONE TABLET BY MOUTH EVERYDAY AT BEDTIME  . ferrous sulfate 325 (65 FE) MG EC tablet Take 325 mg by mouth daily with breakfast.  . fluticasone (FLOVENT HFA) 44 MCG/ACT inhaler Inhale 1 puff into the lungs in the morning and at bedtime.  . furosemide (LASIX) 20 MG tablet Take 2 tablets (40 mg total) by mouth daily.  Marland Kitchen gabapentin (NEURONTIN) 600 MG tablet Take 1 tablet (600 mg total) by mouth 2 (two) times daily.  . hydrocortisone (ANUSOL-HC) 2.5 % rectal cream Place 1 application rectally 2 (two) times daily.  Marland Kitchen LORazepam (ATIVAN) 0.5 MG tablet TAKE ONE TABLET BY MOUTH EVERYDAY AT BEDTIME  . metoprolol tartrate (LOPRESSOR) 50 MG tablet Take 1.5 tablets (75 mg total) by mouth 2 (two) times daily.  . nitroGLYCERIN (NITROSTAT) 0.4 MG SL tablet Place 1 tablet (0.4 mg total) under the tongue every 5 (five) minutes as needed for chest pain (x 3 pills). Reported on 09/01/2015  . sertraline (ZOLOFT) 50 MG tablet Take 1  tablet (50 mg total) by mouth daily.  . silver sulfADIAZINE (SILVADENE) 1 % cream SMARTSIG:1 Sparingly Topical Twice Daily  . Spacer/Aero-Holding Chambers (E-Z SPACER) inhaler Use as instructed  . Tiotropium Bromide Monohydrate (SPIRIVA RESPIMAT) 2.5 MCG/ACT AERS Inhale 2 puffs into the lungs daily.  . traZODone (DESYREL) 50 MG tablet Take 1 tablet (50 mg total) by mouth at bedtime.  . triamcinolone cream (KENALOG) 0.1 % Apply 1 application topically 2 (two) times daily.    No facility-administered encounter medications on file as of 06/15/2020.    Current Diagnosis: Patient Active Problem List   Diagnosis Date Noted  . Mediastinal adenopathy 01/21/2020  . Cellulitis 08/03/2019  . CKD (chronic kidney disease), stage III ([place]) 08/03/2019  . Centrilobular emphysema ([place]) 04/06/2019  . Gastric ulcer   . Thrombocytopenia ([place]) 10/27/2017  . Abnormal stress test 10/27/2017  . Moderate mitral regurgitation 10/27/2017  . Acute on chronic diastolic CHF (congestive heart failure) ([place]) 10/27/2017  . CAP (community acquired pneumonia) 10/26/2017  . Displaced fracture of lateral malleolus of right fibula, subsequent encounter for closed fracture with routine healing 12/27/2016  . Iron deficiency anemia 11/12/2016  . Chronic anticoagulation 11/12/2016  . Chronic atrial fibrillation ([place]) 08/05/2016  . Coronary artery disease of native artery of native heart with stable angina pectoris ([place]) 08/05/2016  . Failed total left knee replacement ([place]) 08/02/2015  . Sigmoid diverticulitis 11/24/2014  . Neuropathic pain  06/13/2014  . Insomnia 06/13/2014  . Anxiety and depression 06/13/2014  . Cancer of ascending colon ([place]) 06/13/2014  . Colovaginal fistula 04/20/2014  . Lethargy 09/15/2013  . Chest pain, musculoskeletal 02/19/2011  . Depression 02/07/2010  . Chronic diastolic CHF (congestive heart failure) ([place]) 02/27/2009  . Pure hypercholesterolemia 04/05/2008  . Coronary atherosclerosis  04/05/2008  . Osteoarthritis 05/19/2007  . Essential hypertension 01/21/2007  . GERD 01/21/2007  . Hiatal hernia 01/21/2007  . Diverticulosis of large intestine 01/21/2007  . COLONIC POLYPS, HX OF 01/21/2007    Goals Addressed   None    Reviewed chart for medication changes ahead of medication coordination call. Reviewed OVs, Consults, or hospital visits since last care coordination call/Pharmacist visit. . 06-06-2020 Consult visit- [name], Wonda Cheng, MD Cardiology   No medication changes indicated.  BP Readings from Last 3 Encounters:  06/06/20 138/80  04/11/20 119/75  02/02/20 126/80    Lab Results  Component Value Date   HGBA1C 5.9 (H) 05/31/2014     Patient obtains medications through Vials  90 Days   Last adherence delivery included:   Allopurinol 100 mg: One tablet by mouth daily.  Furosemide 20 mg: Two tablets by mouth daily.  Famotidine 20 mg: One tablet by mouth at bedtime.  Gabapentin 600 mg: One tablet by mouth 2 (two) times daily.  Trazodone 50 mg: One tablet by mouth at bedtime.  Sertraline 50 mg: One tablet by mouth daily.  Esomeprazole 40 mg: One capsule by mouth daily.  Benazepril 40 mg: one tablet by mouth daily.  Lorazepam 0.5 mg one tablet by mouth at bedtime.     I spoke with the patient and review medications. There are no changes in medications currently. The patient is taking the following medications:  Allopurinol 100 mg: One tablet by mouth daily.  Furosemide 20 mg: Two tablets by mouth daily.  Famotidine 20 mg: One tablet by mouth at bedtime.  Gabapentin 600 mg: One tablet by mouth 2 (two) times daily.  Trazodone 50 mg: One tablet by mouth at bedtime.  Sertraline 50 mg: One tablet by mouth daily.  Esomeprazole 40 mg: One capsule by mouth daily.  Benazepril 40 mg: one tablet by mouth daily.  Lorazepam 0.5 mg one tablet by mouth at bedtime.  Spiriva 2.5 mg: Inhale two puffs twice a day  fluticasone (FLOVENT HFA) 44  MCG/ACT inhaler  triamcinolone cream (KENALOG) 0.1 %  Silver Sulfadiazine 1% topical cream   Coordinated acute fill for Lorazepam 0.5 MG to be delivered 06-20-2020.  She does not need refills currently. Confirmed delivery date of 07-23-2020, advised patient that pharmacy will contact them the morning of delivery.  Follow-Up:  Coordination of Enhanced Pharmacy Services   Amilia ([place]) Mare Ferrari, [place] Assistant [phone]

## 2020-06-19 ENCOUNTER — Other Ambulatory Visit: Payer: Self-pay

## 2020-06-19 NOTE — Patient Outreach (Signed)
  [facility]) Care Management Chronic Special Needs Program    06/19/2020  Name: [name], DOB: 25-Apr-1930  MRN: [id number]   Ms. [name] is enrolled in a chronic special needs plan for Heart Failure.   [facility] [employer] Care Management Team has assumed care and services for this member. Case closed by [place] RN, [facility], [place] Management [phone]

## 2020-06-20 ENCOUNTER — Other Ambulatory Visit: Payer: Self-pay

## 2020-06-20 ENCOUNTER — Ambulatory Visit: Payer: HMO | Admitting: Pulmonary Disease

## 2020-06-20 ENCOUNTER — Encounter: Payer: Self-pay | Admitting: Pulmonary Disease

## 2020-06-20 VITALS — BP 138/90 | HR 95 | Temp 97.4°F | Ht 63.0 in | Wt 191.6 lb

## 2020-06-20 DIAGNOSIS — J439 Emphysema, unspecified: Secondary | ICD-10-CM | POA: Diagnosis not present

## 2020-06-20 MED ORDER — PREDNISONE 20 MG PO TABS: 20.0000 mg | ORAL_TABLET | Freq: Every day | ORAL | 0 refills | Status: AC

## 2020-06-20 MED ORDER — ANORO ELLIPTA 62.5-25 MCG/INH IN AEPB: 1.0000 | INHALATION_SPRAY | Freq: Every day | RESPIRATORY_TRACT | 0 refills | Status: AC

## 2020-06-20 NOTE — Patient Instructions (Addendum)
Emphysema - uncontrolled, not in active exacerbation --STOP Spiriva for one month --START Anoro ONE puff ONCE a day --CONTINUE albuterol as needed  Follow-up in 1 month with me

## 2020-06-20 NOTE — Progress Notes (Signed)
Synopsis: Referred in 04/2018 for lung nodule  Subjective:   PATIENT ID: [name] GENDER: female DOB: 1929/09/30, MRN: [id number]   HPI  Chief Complaint  Patient presents with  . Follow-up    shortness of breath worse with exertion, cough worse than last visit especially when waking up , wheezing,   Ms. Saara Trager is a 86 year old female former smoker, atrial fibrillation, hx colon cancer s/p resection, CAD s/p stent and chronic diastolic heart failure who presents for follow-up.  She is compliant with Spiriva. Uses albuterol once a day. Continues to have severe dyspnea with short distances. Complains of coughing. Minimal clear sputum production. Some wheezing at night.   Social history. Former smoker. 7.5 pack-years. Quit in 1969  I have personally reviewed patient's past medical/family/social history/allergies/current medications.  Past Medical History:  Diagnosis Date  . Adenocarcinoma, colon [facility]) Oncologist-- Dr Truitt Merle   Multifocal (2) Colon cancer @ ileocecal valve and ascending ( [id number]), Stage IIIB, grade I, MMR normal , 2 of 16 +lymph nodes, negative surgical margins---  06-02-2014  Right hemicolectomy w/ colostomy  . Allergy   . Anxiety   . Asthma    inhaler "sometimes"  . CAD (coronary artery disease)    a. LHC 4/09: pLAD 40, mDx 70-75, pLCx 30, mLCx 50, mRCA 90, EF 60% >> PCI: BMS x2 to RCA;  b. Myoview 8/12: normal  . Cataract    bil cateracts removed  . Chronic anemia   . Chronic diastolic CHF (congestive heart failure) (HCC)    a. Echo 912 - Mild LVH, EF 55-60%, no RWMA, Gr 1 DD, mild MR, mild LAE, mild RAE, PASP 59 mmHg (mod to severe pulmo HTN)  . Clotting disorder (HCC)    hx of dvt, tia on eliquis  . Colostomy in place [facility])    s/p colostomy takedown 5/16  . Colovaginal fistula    s/p colostomy >> colostomy takedown 5/16  . COPD with chronic bronchitis ([place])   . Depression   . Dysrhythmia    A fib  . Essential hypertension   . GERD  (gastroesophageal reflux disease)   . History of adenomatous polyp of colon   . History of blood transfusion   . History of DVT of lower extremity   . History of hiatal hernia   . History of TIA (transient ischemic attack)    a. Head CT 6/15: small chronic lacunar infarct in thalamus  . Hyperlipidemia   . OA (osteoarthritis)   . Osteoporosis   . Peripheral neuropathy   . Pneumonia   . Pulmonary HTN ([place])    a. PASP on Echo in 9/12:  59 mmHg  . Renal insufficiency   . Sigmoid diverticulosis    s/p sigmoid colectomy  . Stroke ([place])    TIAs  . Wears dentures   . Wears glasses      Family History  Problem Relation Age of Onset  . Osteoarthritis Mother   . Heart failure Mother   . Colon cancer Maternal Uncle 98  . Heart Problems Father   . Cancer Sister 75       ? colon cancer   . Kidney cancer Brother 17  . Esophageal cancer Neg Hx   . Rectal cancer Neg Hx   . Stomach cancer Neg Hx   . Pancreatic cancer Neg Hx      Social History   Socioeconomic History  . Marital status: Widowed    Spouse name: Not on file  .  Number of children: Not on file  . Years of education: Not on file  . Highest education level: Not on file  Occupational History  . Not on file  Tobacco Use  . Smoking status: Former Smoker    Packs/day: 0.50    Years: 16.00    Pack years: 8.00    Types: Cigarettes    Start date: 47    Quit date: 07/16/1967    Years since quitting: 52.9  . Smokeless tobacco: Never Used  Vaping Use  . Vaping Use: Never used  Substance and Sexual Activity  . Alcohol use: No    Alcohol/week: 0.0 standard drinks  . Drug use: No  . Sexual activity: Not on file  Other Topics Concern  . Not on file  Social History Narrative   Married, 2 children.  As of November 2015 her husband has been living in a nursing home for tendon after years as he suffers from multiple medical problems.   Patient does not drink alcohol nor does she smoke or chew tobacco products   Right  handed   8th grade   1 cup daily   Social Determinants of Health   Financial Resource Strain: Low Risk   . Difficulty of Paying Living Expenses: Not hard at all  Food Insecurity: No Food Insecurity  . Worried About Charity fundraiser in the Last Year: Never true  . Ran Out of Food in the Last Year: Never true  Transportation Needs: No Transportation Needs  . Lack of Transportation (Medical): No  . Lack of Transportation (Non-Medical): No  Physical Activity: Inactive  . Days of Exercise per Week: 0 days  . Minutes of Exercise per Session: 0 min  Stress: No Stress Concern Present  . Feeling of Stress : Not at all  Social Connections: Moderately Isolated  . Frequency of Communication with Friends and Family: More than three times a week  . Frequency of Social Gatherings with Friends and Family: Once a week  . Attends Religious Services: 1 to 4 times per year  . Active Member of Clubs or Organizations: No  . Attends Archivist Meetings: Never  . Marital Status: Widowed  Intimate Partner Violence: Not At Risk  . Fear of Current or Ex-Partner: No  . Emotionally Abused: No  . Physically Abused: No  . Sexually Abused: No     No Known Allergies   Outpatient Medications Prior to Visit  Medication Sig Dispense Refill  . acetaminophen (TYLENOL) 500 MG tablet Take 1,000 mg by mouth every 6 (six) hours as needed for moderate pain.     Marland Kitchen albuterol (PROAIR HFA) 108 (90 Base) MCG/ACT inhaler Inhale 2 puffs into the lungs every 6 (six) hours as needed for wheezing or shortness of breath. 6.7 g 6  . allopurinol (ZYLOPRIM) 100 MG tablet Take 1 tablet (100 mg total) by mouth daily. 90 tablet 1  . apixaban (ELIQUIS) 5 MG TABS tablet Take 1 tablet (5 mg total) by mouth 2 (two) times daily. 180 tablet 0  . Ascorbic Acid (VITAMIN C) 1000 MG tablet Take 1,000 mg by mouth daily.    . benazepril (LOTENSIN) 40 MG tablet TAKE ONE TABLET BY MOUTH ONCE DAILY 90 tablet 1  . esomeprazole (NEXIUM)  40 MG capsule TAKE ONE CAPSULE BY MOUTH ONCE DAILY 90 capsule 1  . famotidine (PEPCID) 20 MG tablet TAKE ONE TABLET BY MOUTH EVERYDAY AT BEDTIME 30 tablet 3  . ferrous sulfate 325 (65 FE) MG EC  tablet Take 325 mg by mouth daily with breakfast.    . fluticasone (FLOVENT HFA) 44 MCG/ACT inhaler Inhale 1 puff into the lungs in the morning and at bedtime. 1 Inhaler 3  . furosemide (LASIX) 20 MG tablet Take 2 tablets (40 mg total) by mouth daily. 180 tablet 3  . gabapentin (NEURONTIN) 600 MG tablet Take 1 tablet (600 mg total) by mouth 2 (two) times daily. 180 tablet 1  . hydrocortisone (ANUSOL-HC) 2.5 % rectal cream Place 1 application rectally 2 (two) times daily. 30 g 1  . LORazepam (ATIVAN) 0.5 MG tablet TAKE ONE TABLET BY MOUTH EVERYDAY AT BEDTIME 30 tablet 2  . metoprolol tartrate (LOPRESSOR) 50 MG tablet Take 1.5 tablets (75 mg total) by mouth 2 (two) times daily. 270 tablet 1  . nitroGLYCERIN (NITROSTAT) 0.4 MG SL tablet Place 1 tablet (0.4 mg total) under the tongue every 5 (five) minutes as needed for chest pain (x 3 pills). Reported on 09/01/2015 25 tablet 3  . sertraline (ZOLOFT) 50 MG tablet Take 1 tablet (50 mg total) by mouth daily. 90 tablet 1  . silver sulfADIAZINE (SILVADENE) 1 % cream SMARTSIG:1 Sparingly Topical Twice Daily    . Spacer/Aero-Holding Chambers (E-Z SPACER) inhaler Use as instructed 1 each 2  . Tiotropium Bromide Monohydrate (SPIRIVA RESPIMAT) 2.5 MCG/ACT AERS Inhale 2 puffs into the lungs daily. 4 g 3  . traZODone (DESYREL) 50 MG tablet Take 1 tablet (50 mg total) by mouth at bedtime. 90 tablet 1  . triamcinolone cream (KENALOG) 0.1 % Apply 1 application topically 2 (two) times daily.      No facility-administered medications prior to visit.    Review of Systems  Constitutional: Negative for chills, diaphoresis, fever, malaise/fatigue and weight loss.  HENT: Negative for congestion.   Respiratory: Negative for cough, hemoptysis, sputum production, shortness of  breath and wheezing.   Cardiovascular: Negative for chest pain, palpitations and leg swelling.      Objective:   Vitals:   06/20/20 1148 06/20/20 1149  BP:  138/90  Pulse:  95  Temp: (!) 97.4 F (36.3 C)   TempSrc: Temporal   SpO2:  98%  Weight: 191 lb 9.6 oz (86.9 kg)   Height: $Remove'5\' 3"'[id number]$  (1.6 m)    Physical Exam: General: Elderly-appearing, no acute distress HENT: [place], AT, OP clear, MMM Eyes: EOMI, no scleral icterus Respiratory: Diminished breath sounds bilaterally.  No crackles, wheezing or rales Cardiovascular: RRR, -M/R/G, no JVD Extremities:-Edema,-tenderness  Chest imaging: CT Chest WO Contrast 04/06/18 - stable 2 mm nodular opacity in the right lower lobe. No pleural effusion evident. There is mild underlying centrilobular emphysematous change.  CT Chest 12/18/18 - No lymphadenopathy, centrilobular emphysema, stable 2 mm nodular opacity in superior segment right lower lobe  PFT: None on file  Echo: TTE 10/28/17 Study Conclusions  - Left ventricle: The cavity size was normal. There was severe   focal basal and moderate concentric hypertrophy. Systolic   function was normal. The estimated ejection fraction was in the   range of 55% to 60%. Wall motion was normal; there were no   regional wall motion abnormalities. The study was not technically   sufficient to allow evaluation of LV diastolic dysfunction due to   atrial fibrillation. - Aortic valve: Mildly to moderately calcified annulus. Trileaflet;   normal thickness, mildly calcified leaflets. - Mitral valve: There was moderate regurgitation. - Left atrium: The atrium was severely dilated. - Tricuspid valve: There was moderate regurgitation. - Pulmonic valve: There  was trivial regurgitation. - Pulmonary arteries: PA peak pressure: 50 mm Hg (S).  Impressions:  - The right ventricular systolic pressure was increased consistent   with moderate pulmonary hypertension.  Imaging, labs and test noted above have  been reviewed independently by me.    Assessment & Plan:   Pulmonary emphysema, unspecified emphysema type [facility])  Discussion: 86 year old female with emphysema and hx of colon cancer s/p resection who presents for follow-up for COPD.   Emphysema - uncontrolled, not in active exacerbation --STOP Spiriva for one month --START Anoro ONE puff ONCE a day --CONTINUE albuterol as needed  Abnormal CT scan Improved lymph node sizes. Will evaluate CT Chest in one year. Will order imaging when closer to 12/2019  Immunization History  Administered Date(s) Administered  . Fluad Quad(high Dose 65+) 04/05/2019  . Influenza Split 06/21/2011, 04/21/2012  . Influenza Whole 05/19/2007, 04/05/2008, 04/26/2010  . Influenza, High Dose Seasonal PF 05/17/2015, 05/22/2016, 04/11/2017, 04/01/2018  . Influenza,inj,Quad PF,6+ Mos 03/10/2013  . Influenza-Unspecified 04/14/2014, 04/29/2020  . PFIZER SARS-COV-2 Vaccination 02/02/2020, 02/23/2020  . PPD Test 06/10/2014, 06/24/2014, 08/18/2015  . Pneumococcal Conjugate-13 05/17/2015  . Pneumococcal Polysaccharide-23 02/10/2013  . Tdap 11/19/2010   Follow-up in one month  Elleni Mozingo Rodman Pickle, MD [facility] 06/20/2020 11:58 AM

## 2020-06-26 ENCOUNTER — Ambulatory Visit: Payer: HMO | Admitting: Pharmacist

## 2020-06-26 DIAGNOSIS — E7800 Pure hypercholesterolemia, unspecified: Secondary | ICD-10-CM

## 2020-06-26 DIAGNOSIS — I10 Essential (primary) hypertension: Secondary | ICD-10-CM

## 2020-06-26 NOTE — Chronic Care Management (AMB) (Signed)
Chronic Care Management Pharmacy  Name: Elizzie Westergard Brau  MRN: [id number] DOB: 09/17/1929   Initial Questions: 1. Have you seen any other providers since your last visit? Yes  2. Any changes in your medicines or health? No   Chief Complaint/ HPI  Demetri Kerman Speers,  86 y.o. , female presents for their Follow-Up CCM visit with the clinical pharmacist via telephone due to COVID-19 Pandemic.  PCP : [name], NP  Their chronic conditions include: diastolic HF, HTN, chronic afib, HLD/ coronary atherosclerosis, COPD, GERD, osteoarthritis, CKD stage III, neuropathic pain, insomnia,  anxiety/ depression, gout, iron deficiency anemia, cellulitis   Office Visits: 05/24/20 Charlott Rakes, LPN: Patient presented for medicare annual wellness visit.  04/11/20 [name], NP: Patient presented for video visit for cough. Prescribed benzonatate, doxycycline and prednisone taper.  02/02/20  [name], NP- Patient presented for office visit for cellulitis of left ring finger for the past 2 weeks. Appears to be more cellulitic than gouty. Prescribed doxycycline 7 day course for cellulitis and prednisone for gout.  11/03/2019- [name], NP- Patient presented for office visit for concern of cellulitis of right leg. Appears venous stasis or venous stasis dermatitis. Patient referred to vascular surgery for further evaluation.   10/05/2019- Patient presented for office visit with [name], NP for decreased urination and red itchy rash on torso. For decreased urination, BMP and urinalysis ordered. For rash, advised to use hydrocortisone cream twice daily as needed. Unlikely rash is due to allopurinol as patient took in the past.   Consult Visit: 06/06/20 Grayland Jack, MD (cardiology): Patient presented for Afib and CHF follow up. Patient feels weak. No changes made. Follow up in 1 year.  01/21/20 - Pulmonary disease - Geraldo Pitter, NP (pulomonology): Patient presented for emphysema and GERD  follow up. Prescribed famotidine 20 mg daily and Flovent HFA 44 mcg 1 puff twice daily.   12/27/2019- Vascular surgery- Harold Barban, MD- Patient presented for office visit for venous stasis evaluation. Patient not a candidate for laser venous ablation. Patient to continue leg elevation and wear compression stockings (20-30).  12/09/2019- Cardiology- Mertie Moores, MD- Patient presented for office visit for follow up for afib, CAD, CHF, anemia.  Patient presented stable. Patient to obtain BMET. Patient advised leg elevation, salt restrition, compression hose, walk regularly.   07/30/2019- Gastroenterology- Patient presented to Dr. Scarlette Shorts, MD for internal hemorrhoids. Patient presented to ER and doctor recommended GI doctor follow up since rectal bleeding [place] be to hemorrhoids. Hgb: 13.3 (07/21/2019). Patient reported no further bleeding. Patient to take Metamucil 2 tablespoons daily and Anusol suppositories at night. Patient to follow up as needed.   Medications: Outpatient Encounter Medications as of 06/26/2020  Medication Sig  . acetaminophen (TYLENOL) 500 MG tablet Take 1,000 mg by mouth every 6 (six) hours as needed for moderate pain.   Marland Kitchen albuterol (PROAIR HFA) 108 (90 Base) MCG/ACT inhaler Inhale 2 puffs into the lungs every 6 (six) hours as needed for wheezing or shortness of breath.  Marland Kitchen apixaban (ELIQUIS) 5 MG TABS tablet Take 1 tablet (5 mg total) by mouth 2 (two) times daily.  . Ascorbic Acid (VITAMIN C) 1000 MG tablet Take 1,000 mg by mouth daily.  . benazepril (LOTENSIN) 40 MG tablet TAKE ONE TABLET BY MOUTH ONCE DAILY  . esomeprazole (NEXIUM) 40 MG capsule TAKE ONE CAPSULE BY MOUTH ONCE DAILY  . famotidine (PEPCID) 20 MG tablet TAKE ONE TABLET BY MOUTH EVERYDAY AT BEDTIME  . ferrous sulfate 325 (65 FE)  MG EC tablet Take 325 mg by mouth daily with breakfast.  . fluticasone (FLOVENT HFA) 44 MCG/ACT inhaler Inhale 1 puff into the lungs in the morning and at bedtime.  . furosemide (LASIX)  20 MG tablet Take 2 tablets (40 mg total) by mouth daily.  Marland Kitchen gabapentin (NEURONTIN) 600 MG tablet Take 1 tablet (600 mg total) by mouth 2 (two) times daily.  . hydrocortisone (ANUSOL-HC) 2.5 % rectal cream Place 1 application rectally 2 (two) times daily.  Marland Kitchen LORazepam (ATIVAN) 0.5 MG tablet TAKE ONE TABLET BY MOUTH EVERYDAY AT BEDTIME  . metoprolol tartrate (LOPRESSOR) 50 MG tablet Take 1.5 tablets (75 mg total) by mouth 2 (two) times daily.  . nitroGLYCERIN (NITROSTAT) 0.4 MG SL tablet Place 1 tablet (0.4 mg total) under the tongue every 5 (five) minutes as needed for chest pain (x 3 pills). Reported on 09/01/2015  . sertraline (ZOLOFT) 50 MG tablet Take 1 tablet (50 mg total) by mouth daily.  . silver sulfADIAZINE (SILVADENE) 1 % cream SMARTSIG:1 Sparingly Topical Twice Daily  . Spacer/Aero-Holding Chambers (E-Z SPACER) inhaler Use as instructed  . Tiotropium Bromide Monohydrate (SPIRIVA RESPIMAT) 2.5 MCG/ACT AERS Inhale 2 puffs into the lungs daily.  . traZODone (DESYREL) 50 MG tablet Take 1 tablet (50 mg total) by mouth at bedtime.  . triamcinolone cream (KENALOG) 0.1 % Apply 1 application topically 2 (two) times daily.   Marland Kitchen umeclidinium-vilanterol (ANORO ELLIPTA) 62.5-25 MCG/INH AEPB Inhale 1 puff into the lungs daily.  . [DISCONTINUED] allopurinol (ZYLOPRIM) 100 MG tablet Take 1 tablet (100 mg total) by mouth daily.   No facility-administered encounter medications on file as of 06/26/2020.    Current Diagnosis/Assessment:  Goals Addressed            This Visit's Progress   . Pharmacy Care Plan       CARE PLAN ENTRY (see longitudinal plan of care for additional care plan information)  Current Barriers:  . Chronic Disease Management support, education, and care coordination needs related to Hypertension, Hyperlipidemia, Atrial Fibrillation, Heart Failure, and Pulmonary emphysema    Atrial fibrillation (Afib) . Pharmacist Clinical Goal(s) o Over the next 90 days, patient will  work with PharmD and providers to maintain normal sinus rhythm.  . Current regimen:  o metoprolol tartrate 50mg , 1.5 (one and one-half) tablets twice daily o apixaban (Eliquis) 5mg  1 tablet twice daily  . Interventions: o We discussed:  monitoring for signs and symptoms for bleeding (coughing up blood, prolonged nose bleeds, black, tarry stools). . Patient self care activities o Patient will continue current medications as prescribed.   Diastolic heart failure . Pharmacist Clinical Goal(s) o Over the next 90 days, patient will work with PharmD and providers to maintain weight stable and check weight daily.  . Current regimen:   benazepril 40mg , 1 tablet once daily   metoprolol tartrate 50mg , 1.5 (one and one-half) tablets twice daily  furosemide 40mg , 1 tablet once daily . Interventions: o We discussed weighing daily; if you gain more than 3 pounds in one day or 5 pounds in one week call your doctor . Patient self care activities o Patient will take medications as prescribed and monitor weight.   Hypertension BP Readings from Last 3 Encounters:  06/20/20 138/90  06/06/20 138/80  04/11/20 119/75   . Pharmacist Clinical Goal(s): o Over the next 90 days, patient will work with PharmD and providers to maintain BP goal <130/80 . Current regimen:   benazepril 40mg , 1 tablet once daily  metoprolol  tartrate 50mg , 1.5 (one and one-half) tablets twice daily . Patient self care activities - Over the next 90 days, patient will: o Check blood pressure daily, document, and provide at future appointments o Ensure daily salt intake < 2300 mg/day o Check blood pressure when feeling lightheaded or dizzy.  Hyperlipidemia Lab Results  Component Value Date/Time   LDLCALC 79 01/19/2013 10:14 AM   . Pharmacist Clinical Goal(s): o Over the next 90 days, patient will work with PharmD and providers to achieve LDL goal < 70 . Current regimen:  o No medications.  . Interventions: o Overdue  for lipid panel (cholesterol blood work) . Patient self care activities - Over the next 90 days, patient will: o Obtain cholesterol blood work.  COPD . Pharmacist Clinical Goal(s) o Over the next 90 days, patient will work with PharmD and providers to prevent worsening of shortness of breath and hospitalizations. . Current regimen:   Anoro ellipta inhale once puff once daily  albuterol (Proair HFA) 108 mcg/act inhaler, 2 puffs every six hours as needed for wheezing or shortness of breath  . Patient self care activities - Over the next 90 days, patient will: o Continue current medications  Medication management . Pharmacist Clinical Goal(s): o Over the next 90 days, patient will work with PharmD and providers to maintain optimal medication adherence . Current pharmacy: [facility] . Interventions o Comprehensive medication review performed. o Utilize [facility] pharmacy for medication synchronization, packaging and delivery . Patient self care activities - Over the next 90 days, patient will: o Take medications as prescribed o Report any questions or concerns to PharmD and/or provider(s)  Please see past updates related to this goal by clicking on the "Past Updates" button in the selected goal            AFIB  Patient is currently rate controlled. HR: 70s BPM   Patient has failed these meds in past:   Patient is currently controlled on the following medications:   metoprolol tartrate 50mg , 1.5 (one and one-half) tablets twice daily   Anticoagulation  apixaban (Eliquis) 5mg  1 tablet twice daily  We discussed:  monitoring for signs and symptoms for bleeding (coughing up blood, prolonged nose bleeds, black, tarry stools).  Plan Continue current medications  Eliquis PAP approved until Dec 31st, 2021 - PCP filled out renewal.  Heart Failure  Type: Diastolic  Last ejection fraction: 55-60% (10/13/2017)   Patient has failed these meds in past: spironolactone    Patient is currently controlled on the following medications:   benazepril 40mg , 1 tablet once daily   metoprolol tartrate 50mg , 1.5 (one and one-half) tablets twice daily  furosemide 40mg , 1 tablet once daily  We discussed weighing daily; if you gain more than 3 pounds in one day or 5 pounds in one week call your doctor- patient reports not weighing every day (at most every couple of days).   Plan Continue current medications.   Hypertension  Denies persistent dizziness/ lightheadedness/ orthostatic hypotension.  BP today is:  <130/80  Office blood pressures are  BP Readings from Last 3 Encounters:  06/20/20 138/90  06/06/20 138/80  04/11/20 119/75   Patient has failed these meds in the past: diltiazem  Patient checks BP at home 1-2x per week  Patient home BP readings are ranging: 110-123/70s   Patient is controlled on:   benazepril 40mg , 1 tablet once daily  metoprolol tartrate 50mg , 1.5 (one and one-half) tablets twice daily  Plan Continue current medications.  Patient  will check BP when lightheaded/dizzy.   Hyperlipidemia/ coronary atherosclerosis  LDL goal < 70   Lipid Panel     Component Value Date/Time   CHOL 130 01/19/2013 1014   TRIG 87.0 01/19/2013 1014   HDL 33.60 (L) 01/19/2013 1014   CHOLHDL 4 01/19/2013 1014   VLDL 17.4 01/19/2013 1014   LDLCALC 79 01/19/2013 1014    The ASCVD Risk score (Goff DC Jr., et al., 2013) failed to calculate for the following reasons:   The 2013 ASCVD risk score is only valid for ages 7 to 54  Patient has failed these meds in past: simvastatin (myalgia)   Patient is currently controlled on the following medications:  nitroglycerin 0.4mg , 1 tablet under tongue every five minutes as needed for chest pain (x 3 pills) (last dose taken January 2021).     Plan Patient overdue for lipid panel. Recommend statin with history of CAD.   COPD  Last spirometry score: none on file   Eosinophil count:   Lab Results   Component Value Date/Time   EOSPCT 4 08/06/2019 05:26 AM   EOSPCT 1.4 02/17/2017 09:32 AM  %                               Eos (Absolute):  Lab Results  Component Value Date/Time   EOSABS 0.1 06/06/2020 11:28 AM   Tobacco Status:  Social History   Tobacco Use  Smoking Status Former Smoker  . Packs/day: 0.50  . Years: 16.00  . Pack years: 8.00  . Types: Cigarettes  . Start date: 18  . Quit date: 07/16/1967  . Years since quitting: 53.0  Smokeless Tobacco Never Used   Patient has failed these meds in past: none  Flovent (per patient not using)   Patient is currently controlled on the following medications:   tiotropium (Spiriva Respimat) 2.5 mcg/ act, inhale 2 puffs into lungs daily (stopped)  albuterol (Proair HFA) 108 mcg/act inhaler, 2 puffs every six hours as needed for wheezing or shortness of breath   Flovent 44 mcg  Inhale 1 puff twice daily  Anoro ellipta inhale once puff once daily  Using maintenance inhaler regularly? Yes Frequency of rescue inhaler use:  2 times per day    We discussed:  proper inhaler technique  - Discussed plan for inhalers per Dr. Cordelia Pen note with using Anoro to replace Spiriva   Plan Will readdress the need for patient assistance once plan is set for inhalers.  Osteoarthritis   Patient has failed these meds in past: none   Patient is currently controlled on the following medications:   acetaminophen (Tylenol) 500mg , 2 tablets every six hours as needed for moderate pain (patient reports taking at most once to twice daily)  Plan Continue current medications  Gout   Patient has failed these meds in past: allopurinol (not taking due to rash)  Patient is currently controlled on the following medications:   colchicine 0.6mg  as needed (last flare up was in January while hospitalized)  Allopurinol 100 mg 1 tablet daily  We discussed: patient denies rash since restarting allopurinol  Plan Continue current  medications  GERD   Patient is currently controlled on the following medications:   esomeprazole 40mg , 1 capsule once daily   We discussed:  non-pharmacological interventions for acid reflux. Take measures to prevent acid reflux, such as avoiding spicy foods, avoiding caffeine, avoid laying down a few hours after eating, and raising the  head of the bed.  Plan Continue current medications  Consider taper plan if patient does not need.  Iron deficiency anemia    Hemoglobin & Hematocrit     Component Value Date/Time   HGB 12.1 06/06/2020 1128   HGB 11.7 02/17/2017 0932   HCT 35.8 06/06/2020 1128   HCT 39.0 02/17/2017 0932   Iron/TIBC/Ferritin/ %Sat    Component Value Date/Time   IRON 129 01/19/2018 0942   IRON 43 06/17/2016 1253   TIBC 290 01/19/2018 0942   TIBC 400 06/17/2016 1253   FERRITIN 101 01/19/2018 0942   FERRITIN 23 06/17/2016 1253   IRONPCTSAT 45 01/19/2018 0942   IRONPCTSAT 11 (L) 06/17/2016 1253   Patient is currently controlled on the following medications:   ferrous sulfate 325mg  1 tablet once daily with breakfast    Plan Continue current medications  Neuropathic pain   Patient is currently controlled on the following medications:   gabapentin 600mg , 1 tablet twice daily   Plan Continue current medications.    Anxiety/ depression  Patient is currently controlled on the following medications:   sertraline 50mg , 1 tablet once daily   Plan Continue current medications  Insomnia  Patient is currently controlled on the following medications:   trazodone 50mg , 1 tablet once daily at bedtime   lorazepam 0.5mg , 1 tablet twice daily as needed for anxiety or sleep (patient reports taking only at bedtime)   Plan Continue current medications.     CKD, Stage 3   Kidney Function Lab Results  Component Value Date/Time   CREATININE 1.46 (H) 06/06/2020 11:28 AM   CREATININE 1.38 (H) 12/09/2019 12:15 PM   CREATININE 1.3 (H) 02/17/2017 09:32 AM    CREATININE 1.5 (H) 09/10/2016 08:54 AM   GFR 32.06 (L) 10/05/2019 10:12 AM   GFRNONAA 32 (L) 06/06/2020 11:28 AM   GFRAA 37 (L) 06/06/2020 11:28 AM   K 4.8 06/06/2020 11:28 AM   K 4.5 12/09/2019 12:15 PM   K 4.6 02/17/2017 09:32 AM   K 5.4 (H) 09/10/2016 08:54 AM   Kidney function improved.   Plan Continue to monitor and adjust medications as needed.    Medication Management   Patient's preferred pharmacy is:  [facility] - [place], [place] - [address] [place] [address] Phone: [phone] Fax: [phone]  Uses pill box? No - adherence packaging Pt endorses 99% compliance  We discussed: Discussed benefits of medication synchronization, packaging and delivery as well as enhanced pharmacist oversight with [facility].  Plan  Utilize [facility] pharmacy for medication synchronization, packaging and delivery    Follow up: 3 month phone visit  Jeni Salles, PharmD [place] Pharmacist [place] [facility] [place]

## 2020-07-11 ENCOUNTER — Other Ambulatory Visit: Payer: Self-pay | Admitting: Adult Health

## 2020-07-17 ENCOUNTER — Telehealth: Payer: Self-pay | Admitting: Pharmacist

## 2020-07-17 NOTE — Chronic Care Management (AMB) (Signed)
Chronic Care Management Pharmacy Assistant   Name: [name]  MRN: [id number] DOB: [birth year]  Reason for Encounter: Medication Review  PCP : Shirline Frees, NP  Allergies:  No Known Allergies  Medications: Outpatient Encounter Medications as of 07/17/2020  Medication Sig  . acetaminophen (TYLENOL) 500 MG tablet Take 1,000 mg by mouth every 6 (six) hours as needed for moderate pain.   Marland Kitchen albuterol (PROAIR HFA) 108 (90 Base) MCG/ACT inhaler Inhale 2 puffs into the lungs every 6 (six) hours as needed for wheezing or shortness of breath.  . allopurinol (ZYLOPRIM) 100 MG tablet TAKE ONE TABLET BY MOUTH ONCE DAILY  . apixaban (ELIQUIS) 5 MG TABS tablet Take 1 tablet (5 mg total) by mouth 2 (two) times daily.  . Ascorbic Acid (VITAMIN C) 1000 MG tablet Take 1,000 mg by mouth daily.  . benazepril (LOTENSIN) 40 MG tablet TAKE ONE TABLET BY MOUTH ONCE DAILY  . esomeprazole (NEXIUM) 40 MG capsule TAKE ONE CAPSULE BY MOUTH ONCE DAILY  . famotidine (PEPCID) 20 MG tablet TAKE ONE TABLET BY MOUTH EVERYDAY AT BEDTIME  . ferrous sulfate 325 (65 FE) MG EC tablet Take 325 mg by mouth daily with breakfast.  . fluticasone (FLOVENT HFA) 44 MCG/ACT inhaler Inhale 1 puff into the lungs in the morning and at bedtime.  . furosemide (LASIX) 20 MG tablet Take 2 tablets (40 mg total) by mouth daily.  Marland Kitchen gabapentin (NEURONTIN) 600 MG tablet Take 1 tablet (600 mg total) by mouth 2 (two) times daily.  . hydrocortisone (ANUSOL-HC) 2.5 % rectal cream Place 1 application rectally 2 (two) times daily.  Marland Kitchen LORazepam (ATIVAN) 0.5 MG tablet TAKE ONE TABLET BY MOUTH EVERYDAY AT BEDTIME  . metoprolol tartrate (LOPRESSOR) 50 MG tablet Take 1.5 tablets (75 mg total) by mouth 2 (two) times daily.  . nitroGLYCERIN (NITROSTAT) 0.4 MG SL tablet Place 1 tablet (0.4 mg total) under the tongue every 5 (five) minutes as needed for chest pain (x 3 pills). Reported on 09/01/2015  . sertraline (ZOLOFT) 50 MG tablet Take 1 tablet (50 mg  total) by mouth daily.  . silver sulfADIAZINE (SILVADENE) 1 % cream SMARTSIG:1 Sparingly Topical Twice Daily  . Spacer/Aero-Holding Chambers (E-Z SPACER) inhaler Use as instructed  . Tiotropium Bromide Monohydrate (SPIRIVA RESPIMAT) 2.5 MCG/ACT AERS Inhale 2 puffs into the lungs daily.  . traZODone (DESYREL) 50 MG tablet Take 1 tablet (50 mg total) by mouth at bedtime.  . triamcinolone cream (KENALOG) 0.1 % Apply 1 application topically 2 (two) times daily.   Marland Kitchen umeclidinium-vilanterol (ANORO ELLIPTA) 62.5-25 MCG/INH AEPB Inhale 1 puff into the lungs daily.   No facility-administered encounter medications on file as of 07/17/2020.    Current Diagnosis: Patient Active Problem List   Diagnosis Date Noted  . Mediastinal adenopathy 01/21/2020  . Cellulitis 08/03/2019  . CKD (chronic kidney disease), stage III (HCC) 08/03/2019  . Centrilobular emphysema (HCC) 04/06/2019  . Gastric ulcer   . Thrombocytopenia (HCC) 10/27/2017  . Abnormal stress test 10/27/2017  . Moderate mitral regurgitation 10/27/2017  . Acute on chronic diastolic CHF (congestive heart failure) (HCC) 10/27/2017  . CAP (community acquired pneumonia) 10/26/2017  . Displaced fracture of lateral malleolus of right fibula, subsequent encounter for closed fracture with routine healing 12/27/2016  . Iron deficiency anemia 11/12/2016  . Chronic anticoagulation 11/12/2016  . Chronic atrial fibrillation (HCC) 08/05/2016  . Coronary artery disease of native artery of native heart with stable angina pectoris (HCC) 08/05/2016  . Failed total left  knee replacement ([place]) 08/02/2015  . Sigmoid diverticulitis 11/24/2014  . Neuropathic pain 06/13/2014  . Insomnia 06/13/2014  . Anxiety and depression 06/13/2014  . Cancer of ascending colon ([place]) 06/13/2014  . Colovaginal fistula 04/20/2014  . Lethargy 09/15/2013  . Chest pain, musculoskeletal 02/19/2011  . Depression 02/07/2010  . Chronic diastolic CHF (congestive heart failure) ([place])  02/27/2009  . Pure hypercholesterolemia 04/05/2008  . Coronary atherosclerosis 04/05/2008  . Osteoarthritis 05/19/2007  . Essential hypertension 01/21/2007  . GERD 01/21/2007  . Hiatal hernia 01/21/2007  . Diverticulosis of large intestine 01/21/2007  . COLONIC POLYPS, HX OF 01/21/2007    Goals Addressed   None    Reviewed chart for medication changes ahead of medication coordination call. No OVs, Consults, or hospital visits since last care coordination call/Pharmacist visit.  No medication changes indicated   BP Readings from Last 3 Encounters:  06/20/20 138/90  06/06/20 138/80  04/11/20 119/75    Lab Results  Component Value Date   HGBA1C 5.9 (H) 05/31/2014     Patient obtains medications through Vials  90 Days   Last adherence delivery included:  Marland Kitchen Allopurinol 100 mg: One tablet by mouth daily. . Furosemide 20 mg: Two tablets by mouth daily. . Famotidine 20 mg: One tablet by mouth at bedtime. . Gabapentin 600 mg: One tablet by mouth 2 (two) times daily. . Trazodone 50 mg: One tablet by mouth at bedtime. . Sertraline 50 mg: One tablet by mouth daily. . Esomeprazole 40 mg: One capsule by mouth daily. . Benazepril 40 mg: one tablet by mouth daily. . Lorazepam 0.5 mg one tablet by mouth at bedtime.  Patient is due for next adherence delivery on: 07/21/2020. Called patient and reviewed medications and coordinated delivery.  This delivery to include: . Allopurinol 100 mg: one tablet by mouth daily. . Furosemide 20 mg: two tablets by mouth daily. . Gabapentin 600 mg: one tablet by mouth 2 (two) times daily. . Trazodone 50 mg: one tablet by mouth at bedtime. . Sertraline 50 mg: one tablet by mouth daily. . Esomeprazole 40 mg: one capsule by mouth daily . Benazepril 40 mg: one tablet by mouth daily. . Lorazepam 0.5 mg one tablet by mouth at bedtime . Metoprolol Tartrate 50 mg: one and half (1.5) twice a day . Triamcinolone cream 0.1% apply twice a day  I spoke with  the patient and review medications. There are no changes in medications currently. Patient declined these last month due to PRN use/additional supply on hand.The patient is taking the following medications: . Famotidine 20 mg: One tablet by mouth at bedtime. Marland Kitchen Spiriva 2.5 mg: Inhale two puffs twice a day . Fluticasone (FLOVENT HFA) 44 MCG/ACT inhaler . Triamcinolone cream (KENALOG) 0.1 % . Silver Sulfadiazine 1% topical cream   Patient needs refills for Triamcinolone cream 0.1% Confirmed delivery date of 07/21/2020, advised patient that pharmacy will contact them the morning of delivery.  Follow-Up:  Coordination of Enhanced Pharmacy Services   Amilia ([place]) Mare Ferrari, [place] Assistant [phone]

## 2020-07-24 ENCOUNTER — Encounter: Payer: Self-pay | Admitting: Pulmonary Disease

## 2020-07-24 ENCOUNTER — Other Ambulatory Visit: Payer: Self-pay

## 2020-07-24 ENCOUNTER — Ambulatory Visit: Payer: HMO | Admitting: Pulmonary Disease

## 2020-07-24 VITALS — BP 120/84 | HR 80 | Temp 97.8°F | Ht 63.0 in | Wt 190.4 lb

## 2020-07-24 DIAGNOSIS — I8310 Varicose veins of unspecified lower extremity with inflammation: Secondary | ICD-10-CM | POA: Diagnosis not present

## 2020-07-24 DIAGNOSIS — J439 Emphysema, unspecified: Secondary | ICD-10-CM

## 2020-07-24 DIAGNOSIS — L821 Other seborrheic keratosis: Secondary | ICD-10-CM | POA: Diagnosis not present

## 2020-07-24 MED ORDER — BREZTRI AEROSPHERE 160-9-4.8 MCG/ACT IN AERO: 2.0000 | INHALATION_SPRAY | Freq: Two times a day (BID) | RESPIRATORY_TRACT | 0 refills | Status: DC

## 2020-07-24 NOTE — Progress Notes (Signed)
Patient seen in the office today and instructed on use of Breztri.  Patient expressed understanding and demonstrated technique.  

## 2020-07-24 NOTE — Patient Instructions (Signed)
Emphysema - uncontrolled, not in active exacerbation --STOP Anoro --START Breztri TWO puffs TWICE a day --CONTINUE Albuterol as needed  Follow-up in one month with me

## 2020-07-24 NOTE — Progress Notes (Signed)
Synopsis: Referred in 04/2018 for lung nodule  Subjective:   PATIENT ID: [name] GENDER: female DOB: [birth year], MRN: [id number]   HPI  Chief Complaint  Patient presents with  . Follow-up    1 mo f/u after starting Anoro. Cough has increased, worse at night. Productive cough with clear, thick mucus. Has not noticed a difference in her SOB.    Ms. 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  On our last visit she was started on a trial of Anoro for uncontrolled symptoms of cough, shortness of breath and wheezing. She feels the inhaler is not as effective compared to Spiriva and does not like how the medication is delivered. Shortness of breath is worse and not able to walk as far. Her cough is more productive with thicker sputum, but remains clear in color.  Social history. Former smoker. 7.5 pack-years. Quit in 1969  I have personally reviewed patient's past medical/family/social history/allergies/current medications.  Past Medical History:  Diagnosis Date  . Adenocarcinoma, colon [facility]) Oncologist-- Dr Truitt Merle   Multifocal (2) Colon cancer @ ileocecal valve and ascending ( mT4N1Mx), Stage IIIB, grade I, MMR normal , 2 of 16 +lymph nodes, negative surgical margins---  06-02-2014  Right hemicolectomy w/ colostomy  . Allergy   . Anxiety   . Asthma    inhaler "sometimes"  . CAD (coronary artery disease)    a. LHC 4/09: pLAD 40, mDx 70-75, pLCx 30, mLCx 50, mRCA 90, EF 60% >> PCI: BMS x2 to RCA;  b. Myoview 8/12: normal  . Cataract    bil cateracts removed  . Chronic anemia   . Chronic diastolic CHF (congestive heart failure) (HCC)    a. Echo 912 - Mild LVH, EF 55-60%, no RWMA, Gr 1 DD, mild MR, mild LAE, mild RAE, PASP 59 mmHg (mod to severe pulmo HTN)  . Clotting disorder (HCC)    hx of dvt, tia on eliquis  . Colostomy in place [facility])     s/p colostomy takedown 5/16  . Colovaginal fistula    s/p colostomy >> colostomy takedown 5/16  . COPD with chronic bronchitis ([place])   . Depression   . Dysrhythmia    A fib  . Essential hypertension   . GERD (gastroesophageal reflux disease)   . History of adenomatous polyp of colon   . History of blood transfusion   . History of DVT of lower extremity   . History of hiatal hernia   . History of TIA (transient ischemic attack)    a. Head CT 6/15: small chronic lacunar infarct in thalamus  . Hyperlipidemia   . OA (osteoarthritis)   . Osteoporosis   . Peripheral neuropathy   . Pneumonia   . Pulmonary HTN ([place])    a. PASP on Echo in 9/12:  59 mmHg  . Renal insufficiency   . Sigmoid diverticulosis    s/p sigmoid colectomy  . Stroke ([place])    TIAs  . Wears dentures   . Wears glasses     No Known Allergies   Outpatient Medications Prior to Visit  Medication Sig Dispense Refill  . acetaminophen (TYLENOL) 500 MG tablet Take 1,000 mg by mouth every 6 (six) hours as needed for moderate pain.     Marland Kitchen albuterol (PROAIR HFA) 108 (90 Base) MCG/ACT inhaler Inhale 2 puffs into the lungs every 6 (six)  hours as needed for wheezing or shortness of breath. 6.7 g 6  . allopurinol (ZYLOPRIM) 100 MG tablet TAKE ONE TABLET BY MOUTH ONCE DAILY 90 tablet 3  . apixaban (ELIQUIS) 5 MG TABS tablet Take 1 tablet (5 mg total) by mouth 2 (two) times daily. 180 tablet 0  . Ascorbic Acid (VITAMIN C) 1000 MG tablet Take 1,000 mg by mouth daily.    . benazepril (LOTENSIN) 40 MG tablet TAKE ONE TABLET BY MOUTH ONCE DAILY 90 tablet 1  . esomeprazole (NEXIUM) 40 MG capsule TAKE ONE CAPSULE BY MOUTH ONCE DAILY 90 capsule 1  . famotidine (PEPCID) 20 MG tablet TAKE ONE TABLET BY MOUTH EVERYDAY AT BEDTIME 30 tablet 3  . ferrous sulfate 325 (65 FE) MG EC tablet Take 325 mg by mouth daily with breakfast.    . fluticasone (FLOVENT HFA) 44 MCG/ACT inhaler Inhale 1 puff into the lungs in the morning and at bedtime. 1  Inhaler 3  . furosemide (LASIX) 20 MG tablet Take 2 tablets (40 mg total) by mouth daily. 180 tablet 3  . gabapentin (NEURONTIN) 600 MG tablet Take 1 tablet (600 mg total) by mouth 2 (two) times daily. 180 tablet 1  . hydrocortisone (ANUSOL-HC) 2.5 % rectal cream Place 1 application rectally 2 (two) times daily. 30 g 1  . LORazepam (ATIVAN) 0.5 MG tablet TAKE ONE TABLET BY MOUTH EVERYDAY AT BEDTIME 30 tablet 2  . nitroGLYCERIN (NITROSTAT) 0.4 MG SL tablet Place 1 tablet (0.4 mg total) under the tongue every 5 (five) minutes as needed for chest pain (x 3 pills). Reported on 09/01/2015 25 tablet 3  . sertraline (ZOLOFT) 50 MG tablet Take 1 tablet (50 mg total) by mouth daily. 90 tablet 1  . silver sulfADIAZINE (SILVADENE) 1 % cream SMARTSIG:1 Sparingly Topical Twice Daily    . Spacer/Aero-Holding Chambers (E-Z SPACER) inhaler Use as instructed 1 each 2  . traZODone (DESYREL) 50 MG tablet Take 1 tablet (50 mg total) by mouth at bedtime. 90 tablet 1  . triamcinolone cream (KENALOG) 0.1 % Apply 1 application topically 2 (two) times daily.     . metoprolol tartrate (LOPRESSOR) 50 MG tablet Take 1.5 tablets (75 mg total) by mouth 2 (two) times daily. 270 tablet 1  . Tiotropium Bromide Monohydrate (SPIRIVA RESPIMAT) 2.5 MCG/ACT AERS Inhale 2 puffs into the lungs daily. 4 g 3   No facility-administered medications prior to visit.    Review of Systems  Constitutional: Negative for chills, diaphoresis, fever, malaise/fatigue and weight loss.  HENT: Negative for congestion.   Respiratory: Positive for cough, sputum production, shortness of breath and wheezing. Negative for hemoptysis.   Cardiovascular: Negative for chest pain, palpitations and leg swelling.      Objective:   Vitals:   07/24/20 0924  BP: 120/84  Pulse: 80  Temp: 97.8 F (36.6 C)  TempSrc: Temporal  SpO2: 100%  Weight: 190 lb 6.4 oz (86.4 kg)  Height: $Remove'5\' 3"'QYMdvkL$  (1.6 m)   Physical Exam: General: Elderly-appearing, no acute  distress HENT: [place], AT, OP clear, MMM Eyes: EOMI, no scleral icterus Respiratory: Clear to auscultation bilaterally.  No crackles, wheezing or rales Cardiovascular: RRR, -M/R/G, no JVD Extremities:-Edema,-tenderness Neuro: AAO x4, CNII-XII grossly intact  Chest imaging: CT Chest WO Contrast 04/06/18 - stable 2 mm nodular opacity in the right lower lobe. No pleural effusion evident. There is mild underlying centrilobular emphysematous change.  CT Chest 12/18/18 - No lymphadenopathy, centrilobular emphysema, stable 2 mm nodular opacity in superior segment right  lower lobe  PFT: None on file  Echo: TTE 10/28/17 Study Conclusions  - Left ventricle: The cavity size was normal. There was severe   focal basal and moderate concentric hypertrophy. Systolic   function was normal. The estimated ejection fraction was in the   range of 55% to 60%. Wall motion was normal; there were no   regional wall motion abnormalities. The study was not technically   sufficient to allow evaluation of LV diastolic dysfunction due to   atrial fibrillation. - Aortic valve: Mildly to moderately calcified annulus. Trileaflet;   normal thickness, mildly calcified leaflets. - Mitral valve: There was moderate regurgitation. - Left atrium: The atrium was severely dilated. - Tricuspid valve: There was moderate regurgitation. - Pulmonic valve: There was trivial regurgitation. - Pulmonary arteries: PA peak pressure: 50 mm Hg (S).  Impressions:  - The right ventricular systolic pressure was increased consistent   with moderate pulmonary hypertension.  Imaging, labs and test noted above have been reviewed independently by me.    Assessment & Plan:   Pulmonary emphysema, unspecified emphysema type [facility])  Discussion: 87 year old female with emphysema and hx of colon cancer s/p resection who presents for follow-up.  Emphysema - uncontrolled, not in active exacerbation --STOP Anoro --START Breztri TWO puffs  TWICE a day --CONTINUE Albuterol as needed  Abnormal CT scan Improved lymph node sizes. Will evaluate CT Chest in one year. Will order imaging when closer to 12/2019  Immunization History  Administered Date(s) Administered  . Fluad Quad(high Dose 65+) 04/05/2019  . Influenza Split 06/21/2011, 04/21/2012  . Influenza Whole 05/19/2007, 04/05/2008, 04/26/2010  . Influenza, High Dose Seasonal PF 05/17/2015, 05/22/2016, 04/11/2017, 04/01/2018  . Influenza,inj,Quad PF,6+ Mos 03/10/2013  . Influenza-Unspecified 04/14/2014, 04/29/2020  . PFIZER SARS-COV-2 Vaccination 02/02/2020, 02/23/2020  . PPD Test 06/10/2014, 06/24/2014, 08/18/2015  . Pneumococcal Conjugate-13 05/17/2015  . Pneumococcal Polysaccharide-23 02/10/2013  . Tdap 11/19/2010   Follow up in one month  [place], MD [facility] 07/24/2020 9:28 AM

## 2020-07-25 ENCOUNTER — Telehealth: Payer: Self-pay | Admitting: Pharmacist

## 2020-07-25 NOTE — Telephone Encounter (Signed)
Patient called as she only has one week left of Eliquis and has been receiving from [employer] patient assistance program.   Called [employer] and her application has not been processed yet. [employer] received her application in Nov 4628 and will expedite her renewal application and confirmed that nothing was missing from the application.  Called patient to let her know and she is aware. If she is unable to get medication from [employer] before she runs out, she will contact the office and will plan to provide her with samples until the medication arrives.

## 2020-08-03 ENCOUNTER — Telehealth: Payer: Self-pay | Admitting: *Deleted

## 2020-08-03 ENCOUNTER — Other Ambulatory Visit: Payer: Self-pay | Admitting: Adult Health

## 2020-08-03 DIAGNOSIS — Z760 Encounter for issue of repeat prescription: Secondary | ICD-10-CM

## 2020-08-03 MED ORDER — APIXABAN 5 MG PO TABS: 5.0000 mg | ORAL_TABLET | Freq: Two times a day (BID) | ORAL | 0 refills | Status: DC

## 2020-08-03 NOTE — Telephone Encounter (Signed)
-----   Message from Viona Gilmore, [facility] sent at 08/02/2020  3:28 PM EST ----- Regarding: Eliquis sample Hi,  Can you please put in a sample of Eliquis 5 mg for Ms. [name]? She is one of Cory's patients. I had to give her 2 different lots to get rid of the newer expiration dates. She got 3 boxes (14 tabs in each box) of lot [id number] exp: 12/2021 and 1 box (14 tabs) of lot [id number] exp: 09/2021.  Thank you! Maddie

## 2020-08-07 ENCOUNTER — Encounter: Payer: Self-pay | Admitting: Pharmacist

## 2020-08-07 NOTE — Chronic Care Management (AMB) (Signed)
Error

## 2020-08-09 ENCOUNTER — Ambulatory Visit: Payer: Self-pay

## 2020-08-09 ENCOUNTER — Other Ambulatory Visit: Payer: Self-pay | Admitting: Adult Health

## 2020-08-09 ENCOUNTER — Ambulatory Visit (INDEPENDENT_AMBULATORY_CARE_PROVIDER_SITE_OTHER): Payer: HMO

## 2020-08-09 ENCOUNTER — Ambulatory Visit: Payer: HMO | Admitting: Orthopedic Surgery

## 2020-08-09 DIAGNOSIS — M19012 Primary osteoarthritis, left shoulder: Secondary | ICD-10-CM | POA: Diagnosis not present

## 2020-08-09 DIAGNOSIS — M25512 Pain in left shoulder: Secondary | ICD-10-CM | POA: Diagnosis not present

## 2020-08-09 DIAGNOSIS — M25511 Pain in right shoulder: Secondary | ICD-10-CM | POA: Diagnosis not present

## 2020-08-09 DIAGNOSIS — M79671 Pain in right foot: Secondary | ICD-10-CM

## 2020-08-09 DIAGNOSIS — M19011 Primary osteoarthritis, right shoulder: Secondary | ICD-10-CM

## 2020-08-10 ENCOUNTER — Encounter: Payer: Self-pay | Admitting: Orthopedic Surgery

## 2020-08-10 DIAGNOSIS — M19012 Primary osteoarthritis, left shoulder: Secondary | ICD-10-CM | POA: Diagnosis not present

## 2020-08-10 DIAGNOSIS — M19011 Primary osteoarthritis, right shoulder: Secondary | ICD-10-CM

## 2020-08-10 DIAGNOSIS — M79671 Pain in right foot: Secondary | ICD-10-CM | POA: Diagnosis not present

## 2020-08-10 MED ORDER — METHYLPREDNISOLONE ACETATE 40 MG/ML IJ SUSP
40.0000 mg | INTRAMUSCULAR | Status: AC | PRN
  Administered 2020-08-10: 40 mg via INTRA_ARTICULAR

## 2020-08-10 MED ORDER — BUPIVACAINE HCL 0.25 % IJ SOLN
4.0000 mL | INTRAMUSCULAR | Status: AC | PRN
  Administered 2020-08-10: 4 mL via INTRA_ARTICULAR

## 2020-08-10 MED ORDER — LIDOCAINE HCL 1 % IJ SOLN
5.0000 mL | INTRAMUSCULAR | Status: AC | PRN
  Administered 2020-08-10: 5 mL

## 2020-08-10 MED ORDER — LIDOCAINE HCL 1 % IJ SOLN
5.0000 mL | INTRAMUSCULAR | Status: AC | PRN
Start: 2020-08-10 — End: 2020-08-10
  Administered 2020-08-10: 5 mL

## 2020-08-10 NOTE — Progress Notes (Signed)
 Office Visit Note   Patient: [name]           Date of Birth: 12/19/1929           MRN: [id number] Visit Date: 08/09/2020 Requested by: Nafziger, Cory, NP [address] [place],  [place] [address] PCP: Nafziger, Cory, NP  Subjective: Chief Complaint  Patient presents with  . Right Foot - Pain  . Other     Bilateral shoulder pain     HPI: [name] is an 87-year-old patient with multiple orthopedic complaints today.  She reports bilateral shoulder pain right worse than left with decreased range of motion and decreased ability to perform activities of daily living.  No history of trauma to the right shoulders.  The pain does wake her from sleep at night.  She also reports right foot pain which is constant localizing to the midfoot region.  She has difficulty walking.  She does wear very good shoes.  Hard for her to lift both arms.  She is using a walker.  She is also on Eliquis.  Takes Tylenol for pain.              ROS: All systems reviewed are negative as they relate to the chief complaint within the history of present illness.  Patient denies  fevers or chills.   Assessment & Plan: Visit Diagnoses:  1. Bilateral shoulder pain, unspecified chronicity   2. Pain in right foot   3. Bilateral shoulder region arthritis     Plan: Impression is bilateral shoulder pain with arthritis present.  Bilateral shoulder injections performed today.  The right foot is a tougher problem.  She is wearing reasonable shoes.  Has significant arthritis in the foot.  I do not think that her pain reaches the threshold for her to consider fusions.  Footwear cannot really be improved.  I think this is something she is can have to live with regarding her foot and less she wants to undergo very significant fusion operations at this point in her life.  Follow-up in 4 months for repeat assessment possible repeat shoulder injections.  Follow-Up Instructions: Return if symptoms worsen or fail to improve.    Orders:  Orders Placed This Encounter  Procedures  . XR Foot Complete Right  . XR Shoulder Right  . XR Shoulder Left   No orders of the defined types were placed in this encounter.     Procedures: Large Joint Inj: bilateral glenohumeral on 08/10/2020 12:09 PM Indications: diagnostic evaluation and pain Details: 18 G 1.5 in needle, posterior approach  Arthrogram: No  Medications (Right): 5 mL lidocaine 1 %; 4 mL bupivacaine 0.25 %; 40 mg methylPREDNISolone acetate 40 MG/ML Medications (Left): 5 mL lidocaine 1 %; 4 mL bupivacaine 0.25 %; 40 mg methylPREDNISolone acetate 40 MG/ML Outcome: tolerated well, no immediate complications Procedure, treatment alternatives, risks and benefits explained, specific risks discussed. Consent was given by the patient. Immediately prior to procedure a time out was called to verify the correct patient, procedure, equipment, support staff and site/side marked as required. Patient was prepped and draped in the usual sterile fashion.       Clinical Data: No additional findings.  Objective: Vital Signs: There were no vitals taken for this visit.  Physical Exam:   Constitutional: Patient appears well-developed HEENT:  Head: Normocephalic Eyes:EOM are normal Neck: Normal range of motion Cardiovascular: Normal rate Pulmonary/chest: Effort normal Neurologic: Patient is alert Skin: Skin is warm Psychiatric: Patient has normal mood and   affect    Ortho Exam: Ortho exam demonstrates full active and passive range of motion of the wrist and elbows.  She has diminished active forward flexion abduction both below 90 degrees in the shoulders.  Pretty reasonable rotator cuff strength infraspinatus supraspinatus and subscap muscle testing.  Does have some coarse grinding on the right consistent with known history of arthritis.  Deltoid is functional bilaterally.  No discrete AC joint tenderness is present.  No other masses lymphadenopathy or skin changes  noted in that shoulder girdle region.  Specialty Comments:  No specialty comments available.  Imaging: No results found.   PMFS History: Patient Active Problem List   Diagnosis Date Noted  . Mediastinal adenopathy 01/21/2020  . Cellulitis 08/03/2019  . CKD (chronic kidney disease), stage III (HCC) 08/03/2019  . Centrilobular emphysema (HCC) 04/06/2019  . Gastric ulcer   . Thrombocytopenia (HCC) 10/27/2017  . Abnormal stress test 10/27/2017  . Moderate mitral regurgitation 10/27/2017  . CAP (community acquired pneumonia) 10/26/2017  . Displaced fracture of lateral malleolus of right fibula, subsequent encounter for closed fracture with routine healing 12/27/2016  . Iron deficiency anemia 11/12/2016  . Chronic anticoagulation 11/12/2016  . Chronic atrial fibrillation (HCC) 08/05/2016  . Coronary artery disease of native artery of native heart with stable angina pectoris (HCC) 08/05/2016  . Failed total left knee replacement (HCC) 08/02/2015  . Sigmoid diverticulitis 11/24/2014  . Neuropathic pain 06/13/2014  . Insomnia 06/13/2014  . Anxiety and depression 06/13/2014  . Cancer of ascending colon (HCC) 06/13/2014  . Colovaginal fistula 04/20/2014  . Lethargy 09/15/2013  . Chest pain, musculoskeletal 02/19/2011  . Depression 02/07/2010  . Chronic diastolic CHF (congestive heart failure) (HCC) 02/27/2009  . Pure hypercholesterolemia 04/05/2008  . Coronary atherosclerosis 04/05/2008  . Osteoarthritis 05/19/2007  . Essential hypertension 01/21/2007  . GERD 01/21/2007  . Hiatal hernia 01/21/2007  . Diverticulosis of large intestine 01/21/2007  . COLONIC POLYPS, HX OF 01/21/2007   Past Medical History:  Diagnosis Date  . Adenocarcinoma, colon (HCC) Oncologist-- Dr Yan Feng   Multifocal (2) Colon cancer @ ileocecal valve and ascending ( mT4N1Mx), Stage IIIB, grade I, MMR normal , 2 of 16 +lymph nodes, negative surgical margins---  06-02-2014  Right hemicolectomy w/ colostomy  .  Allergy   . Anxiety   . Asthma    inhaler "sometimes"  . CAD (coronary artery disease)    a. LHC 4/09: pLAD 40, mDx 70-75, pLCx 30, mLCx 50, mRCA 90, EF 60% >> PCI: BMS x2 to RCA;  b. Myoview 8/12: normal  . Cataract    bil cateracts removed  . Chronic anemia   . Chronic diastolic CHF (congestive heart failure) (HCC)    a. Echo 912 - Mild LVH, EF 55-60%, no RWMA, Gr 1 DD, mild MR, mild LAE, mild RAE, PASP 59 mmHg (mod to severe pulmo HTN)  . Clotting disorder (HCC)    hx of dvt, tia on eliquis  . Colostomy in place (HCC)    s/p colostomy takedown 5/16  . Colovaginal fistula    s/p colostomy >> colostomy takedown 5/16  . COPD with chronic bronchitis (HCC)   . Depression   . Dysrhythmia    A fib  . Essential hypertension   . GERD (gastroesophageal reflux disease)   . History of adenomatous polyp of colon   . History of blood transfusion   . History of DVT of lower extremity   . History of hiatal hernia   . History of TIA (transient   ischemic attack)    a. Head CT 6/15: small chronic lacunar infarct in thalamus  . Hyperlipidemia   . OA (osteoarthritis)   . Osteoporosis   . Peripheral neuropathy   . Pneumonia   . Pulmonary HTN (HCC)    a. PASP on Echo in 9/12:  59 mmHg  . Renal insufficiency   . Sigmoid diverticulosis    s/p sigmoid colectomy  . Stroke (HCC)    TIAs  . Wears dentures   . Wears glasses     Family History  Problem Relation Age of Onset  . Osteoarthritis Mother   . Heart failure Mother   . Colon cancer Maternal Uncle 70  . Heart Problems Father   . Cancer Sister 40       ? colon cancer   . Kidney cancer Brother 30  . Esophageal cancer Neg Hx   . Rectal cancer Neg Hx   . Stomach cancer Neg Hx   . Pancreatic cancer Neg Hx     Past Surgical History:  Procedure Laterality Date  . ABDOMINAL HYSTERECTOMY    . ANTERIOR CERVICAL DECOMP/DISCECTOMY FUSION  01-31-2009   C5 -- 7  . APPENDECTOMY    . Bone spurs Bilateral    Feet  . CARDIOVASCULAR STRESS  TEST  02-26-2011   Normal lexiscan no exercise study/  no ischemia/  normal LV function and wall motion, ef 67%  . CARPAL TUNNEL RELEASE Right   . CATARACT EXTRACTION W/ INTRAOCULAR LENS  IMPLANT, BILATERAL Bilateral   . CHOLECYSTECTOMY    . COLOSTOMY N/A 06/02/2014   Procedure: DIVERTING DESCENDING END COLOSTOMY;  Surgeon: Matthew Tsuei, MD;  Location: [facility] OR;  Service: General;  Laterality: N/A;  . CORONARY ANGIOPLASTY WITH STENT PLACEMENT  11-04-2007  dr bensimhon   BMS x2 to RCA/  mild Non-obstructive disease LAD/  normal LVF  . DILATION AND CURETTAGE OF UTERUS    . ESOPHAGOGASTRODUODENOSCOPY N/A 10/28/2017   Procedure: ESOPHAGOGASTRODUODENOSCOPY (EGD);  Surgeon: Armbruster, Steven P, MD;  Location: [facility] ENDOSCOPY;  Service: Gastroenterology;  Laterality: N/A;  . EVALUATION UNDER ANESTHESIA WITH ANAL FISTULECTOMY N/A 10/13/2014   Procedure: ANAL EXAM UNDER ANESTHESIA ;  Surgeon: Alicia Thomas, MD;  Location: [facility]S;  Service: General;  Laterality: N/A;  . HAND SURGERY     Tendon repair  . LAPAROSCOPIC SIGMOID COLECTOMY N/A 11/24/2014   Procedure: SIGMOID COLECTOMY AND COLSOTOMY CLOSURE;  Surgeon: Matthew Tsuei, MD;  Location: [facility] OR;  Service: General;  Laterality: N/A;  . LUMBAR LAMINECTOMY  10-29-2002,  1969   Left  L3 -- 4  decompression  . PARTIAL COLECTOMY N/A 06/02/2014   Procedure: RIGHT PARTIAL COLECTOMY ;  Surgeon: Matthew Tsuei, MD;  Location: [facility] OR;  Service: General;  Laterality: N/A;  . PATELLECTOMY Right 09/14/2013   Procedure: PATELLECTOMY;  Surgeon: Gregory Scott Dean, MD;  Location: [facility] OR;  Service: Orthopedics;  Laterality: Right;  . PROCTOSCOPY N/A 10/13/2014   Procedure: RIDGE PROCTOSCOPY;  Surgeon: Alicia Thomas, MD;  Location: [facility];  Service: General;  Laterality: N/A;  . REVISION TOTAL KNEE ARTHROPLASTY Bilateral right  04-02-2011/  left 1996 & 10-05-1999  . ROTATOR CUFF REPAIR Bilateral   . TONSILLECTOMY    . TOTAL KNEE ARTHROPLASTY Bilateral left 1994/  right 2000   . TOTAL KNEE REVISION Left 08/02/2015   Procedure: LEFT FEMORAL REVISION;  Surgeon: Frank Aluisio, MD;  Location: [facility];  Service: Orthopedics;  Laterality: Left;  . TRANSTHORACIC ECHOCARDIOGRAM  12-01-2009   Grade I diastolic dysfunction/    ef 60%/  moderate MR/  mild TR   Social History   Occupational History  . Not on file  Tobacco Use  . Smoking status: Former Smoker    Packs/day: 0.50    Years: 16.00    Pack years: 8.00    Types: Cigarettes    Start date: 1953    Quit date: 07/16/1967    Years since quitting: 53.1  . Smokeless tobacco: Never Used  Vaping Use  . Vaping Use: Never used  Substance and Sexual Activity  . Alcohol use: No    Alcohol/week: 0.0 standard drinks  . Drug use: No  . Sexual activity: Not on file       

## 2020-08-11 ENCOUNTER — Telehealth: Payer: Self-pay | Admitting: Pharmacist

## 2020-08-11 NOTE — Chronic Care Management (AMB) (Signed)
Chronic Care Management Pharmacy Assistant   Name: [name]  MRN: [id number] DOB: 07-14-1930  Reason for Encounter: Medication Review  PCP : [name], NP  Allergies:  No Known Allergies  Medications: Outpatient Encounter Medications as of 08/11/2020  Medication Sig  . acetaminophen (TYLENOL) 500 MG tablet Take 1,000 mg by mouth every 6 (six) hours as needed for moderate pain.   Marland Kitchen albuterol (PROAIR HFA) 108 (90 Base) MCG/ACT inhaler Inhale 2 puffs into the lungs every 6 (six) hours as needed for wheezing or shortness of breath.  . allopurinol (ZYLOPRIM) 100 MG tablet TAKE ONE TABLET BY MOUTH ONCE DAILY  . apixaban (ELIQUIS) 5 MG TABS tablet Take 1 tablet (5 mg total) by mouth 2 (two) times daily.  . Ascorbic Acid (VITAMIN C) 1000 MG tablet Take 1,000 mg by mouth daily.  . benazepril (LOTENSIN) 40 MG tablet TAKE ONE TABLET BY MOUTH ONCE DAILY  . Budeson-Glycopyrrol-Formoterol (BREZTRI AEROSPHERE) 160-9-4.8 MCG/ACT AERO Inhale 2 puffs into the lungs 2 (two) times daily.  Marland Kitchen esomeprazole (NEXIUM) 40 MG capsule TAKE ONE CAPSULE BY MOUTH ONCE DAILY  . famotidine (PEPCID) 20 MG tablet TAKE ONE TABLET BY MOUTH EVERYDAY AT BEDTIME  . ferrous sulfate 325 (65 FE) MG EC tablet Take 325 mg by mouth daily with breakfast.  . furosemide (LASIX) 20 MG tablet Take 2 tablets (40 mg total) by mouth daily.  Marland Kitchen gabapentin (NEURONTIN) 600 MG tablet Take 1 tablet (600 mg total) by mouth 2 (two) times daily.  . hydrocortisone (ANUSOL-HC) 2.5 % rectal cream Place 1 application rectally 2 (two) times daily.  Marland Kitchen LORazepam (ATIVAN) 0.5 MG tablet TAKE ONE TABLET BY MOUTH EVERYDAY AT BEDTIME  . metoprolol tartrate (LOPRESSOR) 50 MG tablet Take 1.5 tablets (75 mg total) by mouth 2 (two) times daily.  . nitroGLYCERIN (NITROSTAT) 0.4 MG SL tablet Place 1 tablet (0.4 mg total) under the tongue every 5 (five) minutes as needed for chest pain (x 3 pills). Reported on 09/01/2015  . sertraline (ZOLOFT) 50 MG tablet  Take 1 tablet (50 mg total) by mouth daily.  . silver sulfADIAZINE (SILVADENE) 1 % cream SMARTSIG:1 Sparingly Topical Twice Daily  . Spacer/Aero-Holding Chambers (E-Z SPACER) inhaler Use as instructed  . traZODone (DESYREL) 50 MG tablet Take 1 tablet (50 mg total) by mouth at bedtime.  . triamcinolone cream (KENALOG) 0.1 % Apply 1 application topically 2 (two) times daily.    No facility-administered encounter medications on file as of 08/11/2020.    Current Diagnosis: Patient Active Problem List   Diagnosis Date Noted  . Mediastinal adenopathy 01/21/2020  . Cellulitis 08/03/2019  . CKD (chronic kidney disease), stage III ([place]) 08/03/2019  . Centrilobular emphysema ([place]) 04/06/2019  . Gastric ulcer   . Thrombocytopenia ([facility]) 10/27/2017  . Abnormal stress test 10/27/2017  . Moderate mitral regurgitation 10/27/2017  . CAP (community acquired pneumonia) 10/26/2017  . Displaced fracture of lateral malleolus of right fibula, subsequent encounter for closed fracture with routine healing 12/27/2016  . Iron deficiency anemia 11/12/2016  . Chronic anticoagulation 11/12/2016  . Chronic atrial fibrillation ([place]) 08/05/2016  . Coronary artery disease of native artery of native heart with stable angina pectoris ([place]) 08/05/2016  . Failed total left knee replacement ([place]) 08/02/2015  . Sigmoid diverticulitis 11/24/2014  . Neuropathic pain 06/13/2014  . Insomnia 06/13/2014  . Anxiety and depression 06/13/2014  . Cancer of ascending colon ([place]) 06/13/2014  . Colovaginal fistula 04/20/2014  . Lethargy 09/15/2013  . Chest pain, musculoskeletal  02/19/2011  . Depression 02/07/2010  . Chronic diastolic CHF (congestive heart failure) ([place]) 02/27/2009  . Pure hypercholesterolemia 04/05/2008  . Coronary atherosclerosis 04/05/2008  . Osteoarthritis 05/19/2007  . Essential hypertension 01/21/2007  . GERD 01/21/2007  . Hiatal hernia 01/21/2007  . Diverticulosis of large intestine 01/21/2007  .  COLONIC POLYPS, HX OF 01/21/2007    Goals Addressed   None    Reviewed chart for medication changes ahead of medication coordination call. Reviewed OVs, Consults, or hospital visits since last care coordination call/Pharmacist visit.  Marland Kitchen 08-09-2020 Meredith Pel, MD Orthopedic Surgery . 07-24-2020 [name], MD Pulmonary Disease Reviewed  medication changes indicated OR if recent visit, treatment plan here. .  Budeson-Glycopyrrol-Formoterol (BREZTRI AEROSPHERE) 160-9-4.8 MCG/ACT AERO (Sample was given). . Fluticasone (FLOVENT HFA) 44 MCG/ACT inhaler (D/C on 07/24/2020) . Spiriva 2.5 mg: Inhale two puffs twice a day (D/C on 07/24/2020)  BP Readings from Last 3 Encounters:  07/24/20 120/84  06/20/20 138/90  06/06/20 138/80    Lab Results  Component Value Date   HGBA1C 5.9 (H) 05/31/2014     Patient obtains medications through Adherence Packaging  90 Days  Last adherence delivery included:  Marland Kitchen Allopurinol 100 mg: one tablet by mouth daily. . Furosemide 20 mg: two tablets by mouth daily. . Gabapentin 600 mg: one tablet by mouth 2 (two) times daily. . Trazodone 50 mg: one tablet by mouth at bedtime. . Sertraline 50 mg: one tablet by mouth daily. . Esomeprazole 40 mg: one capsule by mouth daily . Benazepril 40 mg: one tablet by mouth daily. . Lorazepam 0.5 mg one tablet by mouth at bedtime . Metoprolol Tartrate 50 mg: one and half (1.5) twice a day  Triamcinolone cream 0.1% apply twice a day Patient declined the following medications last month due to PRN use/additional supply on hand. . Fluticasone (FLOVENT HFA) 44 MCG/ACT inhaler . Triamcinolone cream (KENALOG) 0.1 % . Silver Sulfadiazine 1% topical cream . Famotidine 20 mg: One tablet by mouth at bedtime. Marland Kitchen Spiriva 2.5 mg: Inhale two puffs twice a day  I spoke with the patient and review medications. There are no changes in medications currently. Patient declined these last month due to PRN use/additional supply  on hand.The patient is taking the following medications: . Allopurinol 100 mg: one tablet by mouth daily. . Furosemide 20 mg: two tablets by mouth daily. . Gabapentin 600 mg: one tablet by mouth 2 (two) times daily. . Trazodone 50 mg: one tablet by mouth at bedtime. . Sertraline 50 mg: one tablet by mouth daily. . Esomeprazole 40 mg: one capsule by mouth daily . Benazepril 40 mg: one tablet by mouth daily. . Lorazepam 0.5 mg one tablet by mouth at bedtime . Metoprolol Tartrate 50 mg: one and half (1.5) twice a day . Triamcinolone cream 0.1% apply twice a day . Triamcinolone cream (KENALOG) 0.1 % . Silver Sulfadiazine 1% topical cream . Famotidine 20 mg: One tablet by mouth at bedtime.  She does not need refill at this time Confirmed delivery date of 10/14/2020, advised patient that pharmacy will contact them the morning of delivery.  Follow-Up:  Pharmacist Review  Maia Breslow, [place] Assistant [phone]

## 2020-08-24 ENCOUNTER — Ambulatory Visit (INDEPENDENT_AMBULATORY_CARE_PROVIDER_SITE_OTHER): Payer: HMO | Admitting: Pulmonary Disease

## 2020-08-24 ENCOUNTER — Encounter: Payer: Self-pay | Admitting: Pulmonary Disease

## 2020-08-24 ENCOUNTER — Other Ambulatory Visit: Payer: Self-pay

## 2020-08-24 VITALS — BP 126/84 | HR 70 | Ht 64.0 in | Wt 186.0 lb

## 2020-08-24 DIAGNOSIS — R059 Cough, unspecified: Secondary | ICD-10-CM

## 2020-08-24 DIAGNOSIS — J432 Centrilobular emphysema: Secondary | ICD-10-CM | POA: Diagnosis not present

## 2020-08-24 MED ORDER — SPIRIVA RESPIMAT 2.5 MCG/ACT IN AERS: 2.0000 | INHALATION_SPRAY | Freq: Every day | RESPIRATORY_TRACT | 3 refills | Status: DC

## 2020-08-24 MED ORDER — PREDNISONE 20 MG PO TABS
20.0000 mg | ORAL_TABLET | Freq: Every day | ORAL | 0 refills | Status: AC
Start: 2020-08-24 — End: 2020-08-29

## 2020-08-24 MED ORDER — SPIRIVA RESPIMAT 2.5 MCG/ACT IN AERS
2.0000 | INHALATION_SPRAY | Freq: Every day | RESPIRATORY_TRACT | 0 refills | Status: DC
Start: 2020-08-24 — End: 2021-02-09

## 2020-08-24 NOTE — Progress Notes (Signed)
Synopsis: Referred in 04/2018 for lung nodule  Subjective:   PATIENT ID: [name] GENDER: female DOB: 1930/01/29, MRN: [id number]   HPI  Chief Complaint  Patient presents with  . Follow-up    Trying to find the right inhaler. Does not like breztri. Would like prednisone, for cough that has been going on for years.    Ms. [name] is a 87 year old female former smoker, atrial fibrillation, history colon cancer status post resection, CAD status post stent and chronic diastolic heart failure who presents for follow-up.  On her last visit she had uncontrolled symptoms of her COPD including cough on Anoro.  We discussed starting Breztri.  Despite compliance to the inhaler, she reports mildly worsening productive cough with clear sputum and shortness of breath with use even with the spacer.  No wheezing.  She is having to use her albuterol inhaler at least once a day.  States that her Spiriva inhaler seem to have a better effect than her last 2 inhalers (Breztri and Anoro).  She does endorse reflux for which she is on nexium 40 mg daily. Has congestion with clear rhinorrhea daily.  Complains of blowing her nose frequently.  Does not use nasal sprays or take allergy medications.   Social history. Former smoker. 7.5 pack-years. Quit in 1969  I have personally reviewed patient's past medical/family/social history, allergies and current medications.  Past Medical History:  Diagnosis Date  . Adenocarcinoma, colon [facility]) Oncologist-- Dr Truitt Merle   Multifocal (2) Colon cancer @ ileocecal valve and ascending ( [id number]), Stage IIIB, grade I, MMR normal , 2 of 16 +lymph nodes, negative surgical margins---  06-02-2014  Right hemicolectomy w/ colostomy  . Allergy   . Anxiety   . Asthma    inhaler "sometimes"  . CAD (coronary artery disease)    a. LHC 4/09: pLAD 40, mDx 70-75, pLCx 30, mLCx 50, mRCA 90, EF 60% >> PCI: BMS x2 to RCA;  b. Myoview 8/12: normal  . Cataract    bil cateracts removed   . Chronic anemia   . Chronic diastolic CHF (congestive heart failure) (HCC)    a. Echo 912 - Mild LVH, EF 55-60%, no RWMA, Gr 1 DD, mild MR, mild LAE, mild RAE, PASP 59 mmHg (mod to severe pulmo HTN)  . Clotting disorder (HCC)    hx of dvt, tia on eliquis  . Colostomy in place [facility])    s/p colostomy takedown 5/16  . Colovaginal fistula    s/p colostomy >> colostomy takedown 5/16  . COPD with chronic bronchitis ([place])   . Depression   . Dysrhythmia    A fib  . Essential hypertension   . GERD (gastroesophageal reflux disease)   . History of adenomatous polyp of colon   . History of blood transfusion   . History of DVT of lower extremity   . History of hiatal hernia   . History of TIA (transient ischemic attack)    a. Head CT 6/15: small chronic lacunar infarct in thalamus  . Hyperlipidemia   . OA (osteoarthritis)   . Osteoporosis   . Peripheral neuropathy   . Pneumonia   . Pulmonary HTN ([place])    a. PASP on Echo in 9/12:  59 mmHg  . Renal insufficiency   . Sigmoid diverticulosis    s/p sigmoid colectomy  . Stroke ([place])    TIAs  . Wears dentures   . Wears glasses     No Known Allergies   Outpatient  Medications Prior to Visit  Medication Sig Dispense Refill  . acetaminophen (TYLENOL) 500 MG tablet Take 1,000 mg by mouth every 6 (six) hours as needed for moderate pain.     Marland Kitchen albuterol (PROAIR HFA) 108 (90 Base) MCG/ACT inhaler Inhale 2 puffs into the lungs every 6 (six) hours as needed for wheezing or shortness of breath. 6.7 g 6  . allopurinol (ZYLOPRIM) 100 MG tablet TAKE ONE TABLET BY MOUTH ONCE DAILY 90 tablet 3  . apixaban (ELIQUIS) 5 MG TABS tablet Take 1 tablet (5 mg total) by mouth 2 (two) times daily. 56 tablet 0  . Ascorbic Acid (VITAMIN C) 1000 MG tablet Take 1,000 mg by mouth daily.    . benazepril (LOTENSIN) 40 MG tablet TAKE ONE TABLET BY MOUTH ONCE DAILY 90 tablet 1  . Budeson-Glycopyrrol-Formoterol (BREZTRI AEROSPHERE) 160-9-4.8 MCG/ACT AERO Inhale 2 puffs  into the lungs 2 (two) times daily. 23.6 g 0  . esomeprazole (NEXIUM) 40 MG capsule TAKE ONE CAPSULE BY MOUTH ONCE DAILY 90 capsule 1  . famotidine (PEPCID) 20 MG tablet TAKE ONE TABLET BY MOUTH EVERYDAY AT BEDTIME 30 tablet 3  . ferrous sulfate 325 (65 FE) MG EC tablet Take 325 mg by mouth daily with breakfast.    . furosemide (LASIX) 20 MG tablet Take 2 tablets (40 mg total) by mouth daily. 180 tablet 3  . gabapentin (NEURONTIN) 600 MG tablet Take 1 tablet (600 mg total) by mouth 2 (two) times daily. 180 tablet 1  . hydrocortisone (ANUSOL-HC) 2.5 % rectal cream Place 1 application rectally 2 (two) times daily. 30 g 1  . LORazepam (ATIVAN) 0.5 MG tablet TAKE ONE TABLET BY MOUTH EVERYDAY AT BEDTIME 30 tablet 2  . nitroGLYCERIN (NITROSTAT) 0.4 MG SL tablet Place 1 tablet (0.4 mg total) under the tongue every 5 (five) minutes as needed for chest pain (x 3 pills). Reported on 09/01/2015 25 tablet 3  . sertraline (ZOLOFT) 50 MG tablet Take 1 tablet (50 mg total) by mouth daily. 90 tablet 1  . silver sulfADIAZINE (SILVADENE) 1 % cream SMARTSIG:1 Sparingly Topical Twice Daily    . Spacer/Aero-Holding Chambers (E-Z SPACER) inhaler Use as instructed 1 each 2  . traZODone (DESYREL) 50 MG tablet Take 1 tablet (50 mg total) by mouth at bedtime. 90 tablet 1  . triamcinolone cream (KENALOG) 0.1 % Apply 1 application topically 2 (two) times daily.     . metoprolol tartrate (LOPRESSOR) 50 MG tablet Take 1.5 tablets (75 mg total) by mouth 2 (two) times daily. 270 tablet 1   No facility-administered medications prior to visit.    Review of Systems  Constitutional: Negative for chills, diaphoresis, fever, malaise/fatigue and weight loss.  HENT: Negative for congestion.   Respiratory: Positive for cough, sputum production and shortness of breath. Negative for hemoptysis and wheezing.   Cardiovascular: Negative for chest pain, palpitations and leg swelling.    Objective:   Vitals:   08/24/20 0914  BP:  126/84  Pulse: 70  SpO2: 98%  Weight: 186 lb (84.4 kg)  Height: $Remove'5\' 4"'BklNzSA$  (1.626 m)   Physical Exam: General: Elderly-appearing, no acute distress HENT: [place], AT Eyes: EOMI, no scleral icterus Respiratory: Clear to auscultation bilaterally.  No crackles, wheezing or rales Cardiovascular: Irregular rate and rhythm, -M/R/G, no JVD Neuro: AAO x4, CNII-XII grossly intact Skin: Intact, no rashes or bruising Psych: Normal mood, normal affect  Chest imaging: CT Chest WO Contrast 04/06/18 - stable 2 mm nodular opacity in the right lower lobe. No  pleural effusion evident. There is mild underlying centrilobular emphysematous change.  CT Chest 12/18/18 - No lymphadenopathy, centrilobular emphysema, stable 2 mm nodular opacity in superior segment right lower lobe  CT chest 03/23/2020-no hilar or mediastinal lymphadenopathy.  Unchanged 2 mm subpleural right lower lobe nodule.  Emphysema. On my review. No further imaging indicated.   PFT: None on file  Echo: TTE 10/28/17 Study Conclusions  - Left ventricle: The cavity size was normal. There was severe   focal basal and moderate concentric hypertrophy. Systolic   function was normal. The estimated ejection fraction was in the   range of 55% to 60%. Wall motion was normal; there were no   regional wall motion abnormalities. The study was not technically   sufficient to allow evaluation of LV diastolic dysfunction due to   atrial fibrillation. - Aortic valve: Mildly to moderately calcified annulus. Trileaflet;   normal thickness, mildly calcified leaflets. - Mitral valve: There was moderate regurgitation. - Left atrium: The atrium was severely dilated. - Tricuspid valve: There was moderate regurgitation. - Pulmonic valve: There was trivial regurgitation. - Pulmonary arteries: PA peak pressure: 50 mm Hg (S).  Impressions:  - The right ventricular systolic pressure was increased consistent   with moderate pulmonary hypertension.  Imaging,  labs and tests noted above have been reviewed independently by me.    Assessment & Plan:   Discussion: 87 year old female former smoker, atrial fibrillation, history colon cancer status post resection, CAD status post stent and chronic diastolic heart failure who presents for follow-up for emphysema.  Unable to tolerate inhaled triple therapy.  I do not think Trelegy would be a good alternative for her as she has difficulty tolerating powder forms.  We discussed Stiolto however her insurance does not cover this medication.  We also discussed nebulizer forms of medication.  Our office will reach out to her insurance to obtain prior approval for Stiolto and/or nebulizer administration of Brovana and Pulmicort.  For now we will return to her Spiriva Respimat which she has tolerated and had some benefit to in the past.  Her cough is likely related to her uncontrolled COPD however will need to optimize management of her nasal congestion and reflux.  Emphysema -uncontrolled, not an active exacerbation --STOP Breztri --START Spiriva Respimat 2.5 mcg TWO puffs ONCE a day --CONTINUE Albuterol as needed --Will work on obtaining approval for [employer]  Nasal congestion/Cough --START prednisone 20 mg for five days --START Flonase 1 spray per nare at night --START claritin or zyrtec or allegra as directed  Immunization History  Administered Date(s) Administered  . Fluad Quad(high Dose 65+) 04/05/2019  . Influenza Split 06/21/2011, 04/21/2012  . Influenza Whole 05/19/2007, 04/05/2008, 04/26/2010  . Influenza, High Dose Seasonal PF 05/17/2015, 05/22/2016, 04/11/2017, 04/01/2018  . Influenza,inj,Quad PF,6+ Mos 03/10/2013  . Influenza-Unspecified 04/14/2014, 04/29/2020  . PFIZER(Purple Top)SARS-COV-2 Vaccination 02/02/2020, 02/23/2020  . PPD Test 06/10/2014, 06/24/2014, 08/18/2015  . Pneumococcal Conjugate-13 05/17/2015  . Pneumococcal Polysaccharide-23 02/10/2013  . Tdap 11/19/2010   No orders of the  defined types were placed in this encounter.  Meds ordered this encounter  Medications  . Tiotropium Bromide Monohydrate (SPIRIVA RESPIMAT) 2.5 MCG/ACT AERS    Sig: Inhale 2 puffs into the lungs daily at 2 PM.    Dispense:  3 each    Refill:  3  . predniSONE (DELTASONE) 20 MG tablet    Sig: Take 1 tablet (20 mg total) by mouth daily with breakfast for 5 days.    Dispense:  5  tablet    Refill:  0  . Tiotropium Bromide Monohydrate (SPIRIVA RESPIMAT) 2.5 MCG/ACT AERS    Sig: Inhale 2 puffs into the lungs daily.    Dispense:  1 each    Refill:  0    Order Specific Question:   Lot Number?    Answer:   [id number]    Order Specific Question:   Expiration Date?    Answer:   01/11/2022    Order Specific Question:   Quantity    Answer:   1   Return in about 3 months (around 11/21/2020).   I have spent a total time of 32-minutes on the day of the appointment reviewing prior documentation, coordinating care and discussing medical diagnosis and plan with the patient/family. Imaging, labs and tests included in this note have been reviewed and interpreted independently by me.  Peni Rupard Rodman Pickle, MD [place] [facility] 08/24/2020 9:24 AM

## 2020-08-24 NOTE — Patient Instructions (Signed)
Emphysema - uncontrolled, not in active exacerbation uncontrolled, not in active exacerbation --STOP Breztri --START Spiriva Respimat 2.5 mcg TWO puffs ONCE a day --CONTINUE Albuterol as needed  Nasal congestion/cough --START prednisone 20 mg for five days --START Flonase 1 spray per nare at night --START claritin or zyrtec or allegra as directed

## 2020-09-04 NOTE — Telephone Encounter (Signed)
Encounter opened in error

## 2020-09-07 ENCOUNTER — Telehealth: Payer: Self-pay | Admitting: Pharmacist

## 2020-09-07 NOTE — Chronic Care Management (AMB) (Signed)
Chronic Care Management Pharmacy Assistant   Name: [name]  MRN: [id number] DOB: 01/06/1930  Reason for Encounter: Medication Review  Patient Questions: 1.  Have you seen any other providers since your last visit? Yes  08-24-2020 [name], MD Pulmonary Disease  2.  Any changes in your medicines or health?    PCP : [name], NP  Allergies:  No Known Allergies  Medications: Outpatient Encounter Medications as of 09/07/2020  Medication Sig  . acetaminophen (TYLENOL) 500 MG tablet Take 1,000 mg by mouth every 6 (six) hours as needed for moderate pain.   Marland Kitchen albuterol (PROAIR HFA) 108 (90 Base) MCG/ACT inhaler Inhale 2 puffs into the lungs every 6 (six) hours as needed for wheezing or shortness of breath.  . allopurinol (ZYLOPRIM) 100 MG tablet TAKE ONE TABLET BY MOUTH ONCE DAILY  . apixaban (ELIQUIS) 5 MG TABS tablet Take 1 tablet (5 mg total) by mouth 2 (two) times daily.  . Ascorbic Acid (VITAMIN C) 1000 MG tablet Take 1,000 mg by mouth daily.  . benazepril (LOTENSIN) 40 MG tablet TAKE ONE TABLET BY MOUTH ONCE DAILY  . esomeprazole (NEXIUM) 40 MG capsule TAKE ONE CAPSULE BY MOUTH ONCE DAILY  . famotidine (PEPCID) 20 MG tablet TAKE ONE TABLET BY MOUTH EVERYDAY AT BEDTIME  . ferrous sulfate 325 (65 FE) MG EC tablet Take 325 mg by mouth daily with breakfast.  . furosemide (LASIX) 20 MG tablet Take 2 tablets (40 mg total) by mouth daily.  Marland Kitchen gabapentin (NEURONTIN) 600 MG tablet Take 1 tablet (600 mg total) by mouth 2 (two) times daily.  . hydrocortisone (ANUSOL-HC) 2.5 % rectal cream Place 1 application rectally 2 (two) times daily.  Marland Kitchen LORazepam (ATIVAN) 0.5 MG tablet TAKE ONE TABLET BY MOUTH EVERYDAY AT BEDTIME  . metoprolol tartrate (LOPRESSOR) 50 MG tablet Take 1.5 tablets (75 mg total) by mouth 2 (two) times daily.  . nitroGLYCERIN (NITROSTAT) 0.4 MG SL tablet Place 1 tablet (0.4 mg total) under the tongue every 5 (five) minutes as needed for chest pain (x 3  pills). Reported on 09/01/2015  . sertraline (ZOLOFT) 50 MG tablet Take 1 tablet (50 mg total) by mouth daily.  . silver sulfADIAZINE (SILVADENE) 1 % cream SMARTSIG:1 Sparingly Topical Twice Daily  . Spacer/Aero-Holding Chambers (E-Z SPACER) inhaler Use as instructed  . Tiotropium Bromide Monohydrate (SPIRIVA RESPIMAT) 2.5 MCG/ACT AERS Inhale 2 puffs into the lungs daily at 2 PM.  . Tiotropium Bromide Monohydrate (SPIRIVA RESPIMAT) 2.5 MCG/ACT AERS Inhale 2 puffs into the lungs daily.  . traZODone (DESYREL) 50 MG tablet Take 1 tablet (50 mg total) by mouth at bedtime.  . triamcinolone cream (KENALOG) 0.1 % Apply 1 application topically 2 (two) times daily.    No facility-administered encounter medications on file as of 09/07/2020.    Current Diagnosis: Patient Active Problem List   Diagnosis Date Noted  . Mediastinal adenopathy 01/21/2020  . Cellulitis 08/03/2019  . CKD (chronic kidney disease), stage III ([place]) 08/03/2019  . Centrilobular emphysema ([place]) 04/06/2019  . Gastric ulcer   . Thrombocytopenia ([place]) 10/27/2017  . Abnormal stress test 10/27/2017  . Moderate mitral regurgitation 10/27/2017  . CAP (community acquired pneumonia) 10/26/2017  . Displaced fracture of lateral malleolus of right fibula, subsequent encounter for closed fracture with routine healing 12/27/2016  . Iron deficiency anemia 11/12/2016  . Chronic anticoagulation 11/12/2016  . Chronic atrial fibrillation ([place]) 08/05/2016  . Coronary artery disease of native artery of native heart with stable  angina pectoris ([place]) 08/05/2016  . Failed total left knee replacement ([place]) 08/02/2015  . Sigmoid diverticulitis 11/24/2014  . Neuropathic pain 06/13/2014  . Insomnia 06/13/2014  . Anxiety and depression 06/13/2014  . Cancer of ascending colon ([place]) 06/13/2014  . Colovaginal fistula 04/20/2014  . Lethargy 09/15/2013  . Chest pain, musculoskeletal 02/19/2011  . Depression 02/07/2010  . Chronic diastolic CHF  (congestive heart failure) ([place]) 02/27/2009  . Pure hypercholesterolemia 04/05/2008  . Coronary atherosclerosis 04/05/2008  . Osteoarthritis 05/19/2007  . Essential hypertension 01/21/2007  . GERD 01/21/2007  . Hiatal hernia 01/21/2007  . Diverticulosis of large intestine 01/21/2007  . COLONIC POLYPS, HX OF 01/21/2007    Goals Addressed   None    Reviewed chart for medication changes ahead of medication coordination call.  BP Readings from Last 3 Encounters:  08/24/20 126/84  07/24/20 120/84  06/20/20 138/90    Lab Results  Component Value Date   HGBA1C 5.9 (H) 05/31/2014     Patient obtains medications through Vials  90 Days   Last adherence delivery included:  Marland Kitchen Allopurinol 100 mg: one tablet by mouth daily. . Furosemide 20 mg: two tablets by mouth daily. . Gabapentin 600 mg: one tablet by mouth 2 (two) times daily. . Trazodone 50 mg: one tablet by mouth at bedtime. . Sertraline 50 mg: one tablet by mouth daily. . Esomeprazole 40 mg: one capsule by mouth daily . Benazepril 40 mg: one tablet by mouth daily. . Lorazepam 0.5 mg one tablet by mouth at bedtime . Metoprolol Tartrate 50 mg: one and half (1.5) twice a day  Patient declined the following medication last month due to PRN use/additional supply on hand. . Triamcinolone cream (KENALOG) 0.1 % . Silver Sulfadiazine 1% topical cream . Famotidine 20 mg: One tablet by mouth at bedtime  Coordinated acute fill for the following medication  to be delivered 09/20/2020. o Lorazepam 0.5 mg one tablet by mouth at bedtime  I spoke with the patient and review medications. There are no changes in medications currently. Patient declined these medication this month due to PRN use/additional supply on hand.The patient is taking the following medications: . Allopurinol 100 mg: one tablet by mouth daily. . Furosemide 20 mg: two tablets by mouth daily. . Gabapentin 600 mg: one tablet by mouth 2 (two) times daily. . Trazodone 50  mg: one tablet by mouth at bedtime. . Sertraline 50 mg: one tablet by mouth daily. . Esomeprazole 40 mg: one capsule by mouth daily . Benazepril 40 mg: one tablet by mouth daily. . Lorazepam 0.5 mg one tablet by mouth at bedtime . Metoprolol Tartrate 50 mg: one and half (1.5) twice a day . Triamcinolone cream (KENALOG) 0.1 % . Silver Sulfadiazine 1% topical cream . Famotidine 20 mg: One tablet by mouth at bedtime.  She does not need refills at this time,  Confirmed delivery date of 10/18/2020, advised patient that pharmacy will contact them the morning of delivery. Follow-Up:  Coordination of Enhanced Pharmacy Services and Pharmacist Review  [name], [place] Assistant [phone]

## 2020-09-11 ENCOUNTER — Telehealth (INDEPENDENT_AMBULATORY_CARE_PROVIDER_SITE_OTHER): Payer: HMO | Admitting: Family Medicine

## 2020-09-11 DIAGNOSIS — R058 Other specified cough: Secondary | ICD-10-CM | POA: Diagnosis not present

## 2020-09-11 DIAGNOSIS — I10 Essential (primary) hypertension: Secondary | ICD-10-CM | POA: Diagnosis not present

## 2020-09-11 DIAGNOSIS — J432 Centrilobular emphysema: Secondary | ICD-10-CM

## 2020-09-11 MED ORDER — LOSARTAN POTASSIUM 50 MG PO TABS: 50.0000 mg | ORAL_TABLET | Freq: Every day | ORAL | 1 refills | Status: DC

## 2020-09-11 MED ORDER — PREDNISONE 20 MG PO TABS
20.0000 mg | ORAL_TABLET | Freq: Every day | ORAL | 0 refills | Status: DC
Start: 2020-09-11 — End: 2020-11-22

## 2020-09-11 MED ORDER — DOXYCYCLINE HYCLATE 100 MG PO CAPS: 100.0000 mg | ORAL_CAPSULE | Freq: Two times a day (BID) | ORAL | 0 refills | Status: DC

## 2020-09-11 NOTE — Progress Notes (Signed)
Patient ID: [name], female   DOB: 08/14/1929, 87 y.o.   MRN: [id number]   This visit type was conducted due to national recommendations for restrictions regarding the COVID-19 pandemic in an effort to limit this patient's exposure and mitigate transmission in our community.   Virtual Visit via Video Note  I connected with Julyanna Talent on 09/11/20 at  1:45 PM EST by a video enabled telemedicine application and verified that I am speaking with the correct person using two identifiers.  Location patient: home Location provider:work or home office Persons participating in the virtual visit: patient, provider  I discussed the limitations of evaluation and management by telemedicine and the availability of in person appointments. The patient expressed understanding and agreed to proceed.   HPI: [name] had called with assistance for her daughter.  She has history of centrilobular emphysema.  She states she has had a few weeks now of cough productive of thick yellow sputum.  Possible low-grade fever.  She had some hoarseness and frequent headaches.  She states she had cough for weeks now.  She saw pulmonary back in early February and was placed on 5 days of prednisone which did help temporarily.  She does take benazepril for hypertension.  She does have history of GERD and takes Nexium 40 mg daily for that.  She was recently placed back on Spiriva.  She cannot get Stiolto approved by her insurance plan.  She had CT scan 03/23/2020 which showed stable right lower lobe 2 mm nodule.  She has been taking some Flonase sporadically.  Generally feels poorly with increased fatigue.  No hemoptysis.  No peripheral edema. Sounds like she has had some persistent cough now for weeks.  No reported appetite or weight changes.   ROS: See pertinent positives and negatives per HPI.  Past Medical History:  Diagnosis Date  . Adenocarcinoma, colon [facility]) Oncologist-- Dr Truitt Merle   Multifocal (2) Colon cancer @ ileocecal  valve and ascending ( [id number]), Stage IIIB, grade I, MMR normal , 2 of 16 +lymph nodes, negative surgical margins---  06-02-2014  Right hemicolectomy w/ colostomy  . Allergy   . Anxiety   . Asthma    inhaler "sometimes"  . CAD (coronary artery disease)    a. LHC 4/09: pLAD 40, mDx 70-75, pLCx 30, mLCx 50, mRCA 90, EF 60% >> PCI: BMS x2 to RCA;  b. Myoview 8/12: normal  . Cataract    bil cateracts removed  . Chronic anemia   . Chronic diastolic CHF (congestive heart failure) (HCC)    a. Echo 912 - Mild LVH, EF 55-60%, no RWMA, Gr 1 DD, mild MR, mild LAE, mild RAE, PASP 59 mmHg (mod to severe pulmo HTN)  . Clotting disorder (HCC)    hx of dvt, tia on eliquis  . Colostomy in place [facility])    s/p colostomy takedown 5/16  . Colovaginal fistula    s/p colostomy >> colostomy takedown 5/16  . COPD with chronic bronchitis ([place])   . Depression   . Dysrhythmia    A fib  . Essential hypertension   . GERD (gastroesophageal reflux disease)   . History of adenomatous polyp of colon   . History of blood transfusion   . History of DVT of lower extremity   . History of hiatal hernia   . History of TIA (transient ischemic attack)    a. Head CT 6/15: small chronic lacunar infarct in thalamus  . Hyperlipidemia   . OA (osteoarthritis)   .  Osteoporosis   . Peripheral neuropathy   . Pneumonia   . Pulmonary HTN ([place])    a. PASP on Echo in 9/12:  59 mmHg  . Renal insufficiency   . Sigmoid diverticulosis    s/p sigmoid colectomy  . Stroke ([place])    TIAs  . Wears dentures   . Wears glasses     Past Surgical History:  Procedure Laterality Date  . ABDOMINAL HYSTERECTOMY    . ANTERIOR CERVICAL DECOMP/DISCECTOMY FUSION  01-31-2009   C5 -- 7  . APPENDECTOMY    . Bone spurs Bilateral    Feet  . CARDIOVASCULAR STRESS TEST  02-26-2011   Normal lexiscan no exercise study/  no ischemia/  normal LV function and wall motion, ef 67%  . CARPAL TUNNEL RELEASE Right   . CATARACT EXTRACTION W/ INTRAOCULAR  LENS  IMPLANT, BILATERAL Bilateral   . CHOLECYSTECTOMY    . COLOSTOMY N/A 06/02/2014   Procedure: DIVERTING DESCENDING END COLOSTOMY;  Surgeon: Donnie Mesa, MD;  Location: [place] [facility];  Service: General;  Laterality: N/A;  . CORONARY ANGIOPLASTY WITH STENT PLACEMENT  11-04-2007  dr bensimhon   BMS x2 to RCA/  mild Non-obstructive disease LAD/  normal LVF  . DILATION AND CURETTAGE OF UTERUS    . ESOPHAGOGASTRODUODENOSCOPY N/A 10/28/2017   Procedure: ESOPHAGOGASTRODUODENOSCOPY (EGD);  Surgeon: Yetta Flock, MD;  Location: [facility] ENDOSCOPY;  Service: Gastroenterology;  Laterality: N/A;  . EVALUATION UNDER ANESTHESIA WITH ANAL FISTULECTOMY N/A 10/13/2014   Procedure: ANAL EXAM UNDER ANESTHESIA ;  Surgeon: [name], MD;  Location: [facility];  Service: General;  Laterality: N/A;  . HAND SURGERY     Tendon repair  . LAPAROSCOPIC SIGMOID COLECTOMY N/A 11/24/2014   Procedure: SIGMOID COLECTOMY AND COLSOTOMY CLOSURE;  Surgeon: Donnie Mesa, MD;  Location: [place];  Service: General;  Laterality: N/A;  . LUMBAR LAMINECTOMY  10-29-2002,  1969   Left  L3 -- 4  decompression  . PARTIAL COLECTOMY N/A 06/02/2014   Procedure: RIGHT PARTIAL COLECTOMY ;  Surgeon: Donnie Mesa, MD;  Location: [place];  Service: General;  Laterality: N/A;  . PATELLECTOMY Right 09/14/2013   Procedure: PATELLECTOMY;  Surgeon: Meredith Pel, MD;  Location: [place];  Service: Orthopedics;  Laterality: Right;  . PROCTOSCOPY N/A 10/13/2014   Procedure: RIDGE PROCTOSCOPY;  Surgeon: [name], MD;  Location: [facility];  Service: General;  Laterality: N/A;  . REVISION TOTAL KNEE ARTHROPLASTY Bilateral right  04-02-2011/  left 1996 & 10-05-1999  . ROTATOR CUFF REPAIR Bilateral   . TONSILLECTOMY    . TOTAL KNEE ARTHROPLASTY Bilateral left 1994/  right 2000  . TOTAL KNEE REVISION Left 08/02/2015   Procedure: LEFT FEMORAL REVISION;  Surgeon: Gaynelle Arabian, MD;  Location: [facility];  Service: Orthopedics;  Laterality: Left;  . TRANSTHORACIC  ECHOCARDIOGRAM  12-01-2009   Grade I diastolic dysfunction/  ef 60%/  moderate MR/  mild TR    Family History  Problem Relation Age of Onset  . Osteoarthritis Mother   . Heart failure Mother   . Colon cancer Maternal Uncle 51  . Heart Problems Father   . Cancer Sister 46       ? colon cancer   . Kidney cancer Brother 6  . Esophageal cancer Neg Hx   . Rectal cancer Neg Hx   . Stomach cancer Neg Hx   . Pancreatic cancer Neg Hx     SOCIAL HX: Smoked only briefly years ago   Current Outpatient Medications:  .  doxycycline (VIBRAMYCIN) 100 MG capsule, Take 1 capsule (100 mg total) by mouth 2 (two) times daily., Disp: 14 capsule, Rfl: 0 .  losartan (COZAAR) 50 MG tablet, Take 1 tablet (50 mg total) by mouth daily., Disp: 30 tablet, Rfl: 1 .  predniSONE (DELTASONE) 20 MG tablet, Take 1 tablet (20 mg total) by mouth daily with breakfast., Disp: 5 tablet, Rfl: 0 .  acetaminophen (TYLENOL) 500 MG tablet, Take 1,000 mg by mouth every 6 (six) hours as needed for moderate pain. , Disp: , Rfl:  .  albuterol (PROAIR HFA) 108 (90 Base) MCG/ACT inhaler, Inhale 2 puffs into the lungs every 6 (six) hours as needed for wheezing or shortness of breath., Disp: 6.7 g, Rfl: 6 .  allopurinol (ZYLOPRIM) 100 MG tablet, TAKE ONE TABLET BY MOUTH ONCE DAILY, Disp: 90 tablet, Rfl: 3 .  apixaban (ELIQUIS) 5 MG TABS tablet, Take 1 tablet (5 mg total) by mouth 2 (two) times daily., Disp: 56 tablet, Rfl: 0 .  Ascorbic Acid (VITAMIN C) 1000 MG tablet, Take 1,000 mg by mouth daily., Disp: , Rfl:  .  esomeprazole (NEXIUM) 40 MG capsule, TAKE ONE CAPSULE BY MOUTH ONCE DAILY, Disp: 90 capsule, Rfl: 1 .  famotidine (PEPCID) 20 MG tablet, TAKE ONE TABLET BY MOUTH EVERYDAY AT BEDTIME, Disp: 30 tablet, Rfl: 3 .  ferrous sulfate 325 (65 FE) MG EC tablet, Take 325 mg by mouth daily with breakfast., Disp: , Rfl:  .  furosemide (LASIX) 20 MG tablet, Take 2 tablets (40 mg total) by mouth daily., Disp: 180 tablet, Rfl: 3 .   gabapentin (NEURONTIN) 600 MG tablet, Take 1 tablet (600 mg total) by mouth 2 (two) times daily., Disp: 180 tablet, Rfl: 1 .  hydrocortisone (ANUSOL-HC) 2.5 % rectal cream, Place 1 application rectally 2 (two) times daily., Disp: 30 g, Rfl: 1 .  LORazepam (ATIVAN) 0.5 MG tablet, TAKE ONE TABLET BY MOUTH EVERYDAY AT BEDTIME, Disp: 30 tablet, Rfl: 2 .  metoprolol tartrate (LOPRESSOR) 50 MG tablet, Take 1.5 tablets (75 mg total) by mouth 2 (two) times daily., Disp: 270 tablet, Rfl: 1 .  nitroGLYCERIN (NITROSTAT) 0.4 MG SL tablet, Place 1 tablet (0.4 mg total) under the tongue every 5 (five) minutes as needed for chest pain (x 3 pills). Reported on 09/01/2015, Disp: 25 tablet, Rfl: 3 .  sertraline (ZOLOFT) 50 MG tablet, Take 1 tablet (50 mg total) by mouth daily., Disp: 90 tablet, Rfl: 1 .  silver sulfADIAZINE (SILVADENE) 1 % cream, SMARTSIG:1 Sparingly Topical Twice Daily, Disp: , Rfl:  .  Spacer/Aero-Holding Chambers (E-Z SPACER) inhaler, Use as instructed, Disp: 1 each, Rfl: 2 .  Tiotropium Bromide Monohydrate (SPIRIVA RESPIMAT) 2.5 MCG/ACT AERS, Inhale 2 puffs into the lungs daily at 2 PM., Disp: 3 each, Rfl: 3 .  Tiotropium Bromide Monohydrate (SPIRIVA RESPIMAT) 2.5 MCG/ACT AERS, Inhale 2 puffs into the lungs daily., Disp: 1 each, Rfl: 0 .  traZODone (DESYREL) 50 MG tablet, Take 1 tablet (50 mg total) by mouth at bedtime., Disp: 90 tablet, Rfl: 1 .  triamcinolone cream (KENALOG) 0.1 %, Apply 1 application topically 2 (two) times daily. , Disp: , Rfl:   EXAM:  VITALS per patient if applicable:  GENERAL: alert, oriented, appears well and in no acute distress  HEENT: atraumatic, conjunttiva clear, no obvious abnormalities on inspection of external nose and ears  NECK: normal movements of the head and neck  LUNGS: on inspection no signs of respiratory distress, breathing rate appears normal, no obvious gross SOB,  gasping or wheezing  CV: no obvious cyanosis  MS: moves all visible extremities  without noticeable abnormality  PSYCH/NEURO: pleasant and cooperative, no obvious depression or anxiety, speech and thought processing grossly intact  ASSESSMENT AND PLAN:  Discussed the following assessment and plan:  Patient with chronic emphysema with several weeks of productive cough.  She did respond temporarily to low-dose prednisone per pulmonary earlier this month.  With her productive cough with yellow sputum we discussed possible trial of antibiotic with doxycycline 100 mg twice daily for 7 days.  Also start back prednisone 20 mg once daily for 5 days. Difficult to gauge duration of cough but we did discuss consideration for stopping ACE inhibitor and starting losartan 50 mg once daily with close follow-up with primary in a couple weeks to reassess.  -Follow-up immediately for any increased shortness of breath, fever, or other concerns.     I discussed the assessment and treatment plan with the patient. The patient was provided an opportunity to ask questions and all were answered. The patient agreed with the plan and demonstrated an understanding of the instructions.   The patient was advised to call back or seek an in-person evaluation if the symptoms worsen or if the condition fails to improve as anticipated.     Carolann Littler, MD

## 2020-09-22 ENCOUNTER — Telehealth: Payer: Self-pay | Admitting: Pharmacist

## 2020-09-22 NOTE — Chronic Care Management (AMB) (Signed)
    Chronic Care Management Pharmacy Assistant   Name: [name]  MRN: [id number] DOB: 1930/04/21  Reason for Encounter: Medication Refill    Medications: Outpatient Encounter Medications as of 09/22/2020  Medication Sig  . acetaminophen (TYLENOL) 500 MG tablet Take 1,000 mg by mouth every 6 (six) hours as needed for moderate pain.   Marland Kitchen albuterol (PROAIR HFA) 108 (90 Base) MCG/ACT inhaler Inhale 2 puffs into the lungs every 6 (six) hours as needed for wheezing or shortness of breath.  . allopurinol (ZYLOPRIM) 100 MG tablet TAKE ONE TABLET BY MOUTH ONCE DAILY  . apixaban (ELIQUIS) 5 MG TABS tablet Take 1 tablet (5 mg total) by mouth 2 (two) times daily.  . Ascorbic Acid (VITAMIN C) 1000 MG tablet Take 1,000 mg by mouth daily.  Marland Kitchen doxycycline (VIBRAMYCIN) 100 MG capsule Take 1 capsule (100 mg total) by mouth 2 (two) times daily.  Marland Kitchen esomeprazole (NEXIUM) 40 MG capsule TAKE ONE CAPSULE BY MOUTH ONCE DAILY  . famotidine (PEPCID) 20 MG tablet TAKE ONE TABLET BY MOUTH EVERYDAY AT BEDTIME  . ferrous sulfate 325 (65 FE) MG EC tablet Take 325 mg by mouth daily with breakfast.  . furosemide (LASIX) 20 MG tablet Take 2 tablets (40 mg total) by mouth daily.  Marland Kitchen gabapentin (NEURONTIN) 600 MG tablet Take 1 tablet (600 mg total) by mouth 2 (two) times daily.  . hydrocortisone (ANUSOL-HC) 2.5 % rectal cream Place 1 application rectally 2 (two) times daily.  Marland Kitchen LORazepam (ATIVAN) 0.5 MG tablet TAKE ONE TABLET BY MOUTH EVERYDAY AT BEDTIME  . losartan (COZAAR) 50 MG tablet Take 1 tablet (50 mg total) by mouth daily.  . metoprolol tartrate (LOPRESSOR) 50 MG tablet Take 1.5 tablets (75 mg total) by mouth 2 (two) times daily.  . nitroGLYCERIN (NITROSTAT) 0.4 MG SL tablet Place 1 tablet (0.4 mg total) under the tongue every 5 (five) minutes as needed for chest pain (x 3 pills). Reported on 09/01/2015  . predniSONE (DELTASONE) 20 MG tablet Take 1 tablet (20 mg total) by mouth daily with breakfast.  . sertraline  (ZOLOFT) 50 MG tablet Take 1 tablet (50 mg total) by mouth daily.  . silver sulfADIAZINE (SILVADENE) 1 % cream SMARTSIG:1 Sparingly Topical Twice Daily  . Spacer/Aero-Holding Chambers (E-Z SPACER) inhaler Use as instructed  . Tiotropium Bromide Monohydrate (SPIRIVA RESPIMAT) 2.5 MCG/ACT AERS Inhale 2 puffs into the lungs daily at 2 PM.  . Tiotropium Bromide Monohydrate (SPIRIVA RESPIMAT) 2.5 MCG/ACT AERS Inhale 2 puffs into the lungs daily.  . traZODone (DESYREL) 50 MG tablet Take 1 tablet (50 mg total) by mouth at bedtime.  . triamcinolone cream (KENALOG) 0.1 % Apply 1 application topically 2 (two) times daily.    No facility-administered encounter medications on file as of 09/22/2020.    Received call from patient regarding medication management via [facility].  Patient requested an acute fill for  Lorazepam (ATIVAN) 0.5 MG tablet  to be delivered: 09/22/2020 Pharmacy needs refills? No  Confirmed delivery date of 09/22/2020, advised patient that pharmacy will contact them the morning of delivery.  Maia Breslow, [place] Assistant [phone]

## 2020-09-25 ENCOUNTER — Telehealth: Payer: Self-pay | Admitting: Pharmacist

## 2020-09-25 ENCOUNTER — Encounter: Payer: Self-pay | Admitting: Family Medicine

## 2020-09-25 NOTE — Chronic Care Management (AMB) (Signed)
Error

## 2020-09-25 NOTE — Chronic Care Management (AMB) (Signed)
I left the patient a message about her upcoming appointment on 09/26/2020  @ 1:00 pm with the clinical pharmacist. She was asked to please have all medication on hand to review with the pharmacist.    Neita Goodnight) Mare Ferrari, [place] Assistant [phone]

## 2020-09-25 NOTE — Chronic Care Management (AMB) (Signed)
Duplicate, opened in error.

## 2020-09-26 ENCOUNTER — Telehealth: Payer: HMO

## 2020-09-26 NOTE — Progress Notes (Deleted)
Chronic Care Management Pharmacy Note  09/26/2020 Name:  [name] MRN:  [id number] DOB:  May 11, 1930  Subjective: [name] is an 87 y.o. year old female who is a primary patient of [name], NP.  The CCM team was consulted for assistance with disease management and care coordination needs.    Engaged with patient by telephone for follow up visit in response to provider referral for pharmacy case management and/or care coordination services.   Consent to Services:  The patient was given information about Chronic Care Management services, agreed to services, and gave verbal consent prior to initiation of services.  Please see initial visit note for detailed documentation.   Patient Care Team: [name], NP as PCP - General (Family Medicine) [name], Wonda Cheng, MD as PCP - Cardiology (Cardiology) Lahoma Rocker, MD as Consulting Physician (Rheumatology) Viona Gilmore, [facility] as Pharmacist (Pharmacist)  Recent office visits: 09/11/20 Carolann Littler, MD: Patient presented with URI. Prescribed doxycycline and prednisone. D/c'd benazepril to replace with losartan.   05/24/20 Charlott Rakes, LPN: Patient presented for medicare annual wellness visit.  04/11/20 [name], NP: Patient presented for video visit for cough. Prescribed benzonatate, doxycycline and prednisone taper.  Recent consult visits: 08/24/20 Chi Rodman Pickle, MD (pulmonary): Patient presented for centrilobular emphysema follow up. Prescribed prednisone and switched Breztri to Spiriva.  08/09/20 Marcene Duos, MD (ortho): Patient presented with right foot pain and shoulder pain. Steroid injections given into shoulders.  07/24/20 Chi Rodman Pickle, MD (pulmonary): Patient presented for centrilobular emphysema follow up. Changed Spiriva to [employer].  06/06/20 Grayland Jack, MD (cardiology): Patient presented for Afib and CHF follow up. Patient feels weak. No changes made. Follow up in 1 year.  01/21/20 -  Pulmonary disease - Geraldo Pitter, NP (pulomonology): Patient presented for emphysema and GERD follow up. Prescribed famotidine 20 mg daily and Flovent HFA 44 mcg 1 puff twice daily.   12/27/2019- Vascular surgery- Harold Barban, MD- Patient presented for office visit for venous stasis evaluation. Patient not a candidate for laser venous ablation. Patient to continue leg elevation and wear compression stockings (20-30).  Hospital visits: None in previous 6 months  Objective:  Lab Results  Component Value Date   CREATININE 1.46 (H) 06/06/2020   BUN 48 (H) 06/06/2020   GFR 32.06 (L) 10/05/2019   GFRNONAA 32 (L) 06/06/2020   GFRAA 37 (L) 06/06/2020   NA 143 06/06/2020   K 4.8 06/06/2020   CALCIUM 9.4 06/06/2020   CO2 24 06/06/2020    Lab Results  Component Value Date/Time   HGBA1C 5.9 (H) 05/31/2014 07:09 PM   HGBA1C 5.8 (H) 12/28/2013 05:15 AM   GFR 32.06 (L) 10/05/2019 10:12 AM   GFR 32.62 (L) 12/10/2018 09:17 AM    Last diabetic Eye exam: No results found for: HMDIABEYEEXA  Last diabetic Foot exam: No results found for: HMDIABFOOTEX   Lab Results  Component Value Date   CHOL 130 01/19/2013   HDL 33.60 (L) 01/19/2013   LDLCALC 79 01/19/2013   TRIG 87.0 01/19/2013   CHOLHDL 4 01/19/2013    Hepatic Function Latest Ref Rng & Units 09/06/2019 08/03/2019 08/02/2019  Total Protein 6.5 - 8.1 g/dL - 7.4 7.3  Albumin 3.5 - 5.0 4.1 3.7 3.7  AST 13 - 35 _0 ALT 7 - 35 _1 Alk Phosphatase 25 - 125 64 57 55  Total Bilirubin 0.3 - 1.2 mg/dL - 0.8 0.8  Bilirubin, Direct 0.0 - 0.3 mg/dL - - -  Lab Results  Component Value Date/Time   TSH 2.620 06/06/2020 11:28 AM   TSH 4.160 05/31/2014 07:09 PM    CBC Latest Ref Rng & Units 06/06/2020 09/06/2019 08/06/2019  WBC 3.4 - 10.8 x10E3/uL 7.6 5.1 6.8  Hemoglobin 11.1 - 15.9 g/dL 12.1 12.8 11.0(L)  Hematocrit 34.0 - 46.6 % 35.8 40 35.2(L)  Platelets 150 - 450 x10E3/uL 160 109(A) 181    Lab Results  Component Value  Date/Time   VD25OH 53 12/27/2013 06:36 PM    Clinical ASCVD: {YES/NO:21197} The ASCVD Risk score [name], et al., 2013) failed to calculate for the following reasons:   The 2013 ASCVD risk score is only valid for ages 58 to 42    Depression screen PHQ 2/9 05/24/2020 01/25/2020 07/28/2019  Decreased Interest 0 0 0  Down, Depressed, Hopeless 0 0 0  PHQ - 2 Score 0 0 0  Some recent data might be hidden     ***Other: (CHADS2VASc if Afib, MMRC or CAT for COPD, ACT, DEXA)  Social History   Tobacco Use  Smoking Status Former Smoker  . Packs/day: 0.50  . Years: 16.00  . Pack years: 8.00  . Types: Cigarettes  . Start date: 50  . Quit date: 07/16/1967  . Years since quitting: 53.2  Smokeless Tobacco Never Used   BP Readings from Last 3 Encounters:  08/24/20 126/84  07/24/20 120/84  06/20/20 138/90   Pulse Readings from Last 3 Encounters:  08/24/20 70  07/24/20 80  06/20/20 95   Wt Readings from Last 3 Encounters:  08/24/20 186 lb (84.4 kg)  07/24/20 190 lb 6.4 oz (86.4 kg)  06/20/20 191 lb 9.6 oz (86.9 kg)    Assessment/Interventions: Review of patient past medical history, allergies, medications, health status, including review of consultants reports, laboratory and other test data, was performed as part of comprehensive evaluation and provision of chronic care management services.   SDOH:  (Social Determinants of Health) assessments and interventions performed: {yes/no:20286}   CCM Care Plan  No Known Allergies  Medications Reviewed Today    Reviewed by Eulas Post, MD (Physician) on 09/11/20 at 1408  Med List Status: <None>  Medication Order Taking? Sig Documenting Provider Last Dose Status Informant  acetaminophen (TYLENOL) 500 MG tablet [id number] No Take 1,000 mg by mouth every 6 (six) hours as needed for moderate pain.  [provider] Taking Active Self  albuterol (PROAIR HFA) 108 (90 Base) MCG/ACT inhaler [id number] No Inhale 2 puffs into the  lungs every 6 (six) hours as needed for wheezing or shortness of breath. [name], MD Taking Active Self  allopurinol (ZYLOPRIM) 100 MG tablet [id number] No TAKE ONE TABLET BY MOUTH ONCE DAILY Nafziger, [name], NP Taking Active   apixaban (ELIQUIS) 5 MG TABS tablet [id number] No Take 1 tablet (5 mg total) by mouth 2 (two) times daily. Nafziger, [name], NP Taking Active   Ascorbic Acid (VITAMIN C) 1000 MG tablet [id number] No Take 1,000 mg by mouth daily. [provider] Taking Active Self  benazepril (LOTENSIN) 40 MG tablet [id number] No TAKE ONE TABLET BY MOUTH ONCE DAILY Nafziger, [name], NP Taking Active   esomeprazole (NEXIUM) 40 MG capsule [id number] No TAKE ONE CAPSULE BY MOUTH ONCE DAILY Nafziger, [name], NP Taking Active   famotidine (PEPCID) 20 MG tablet [id number] No TAKE ONE TABLET BY MOUTH EVERYDAY AT BEDTIME Martyn Ehrich, NP Taking Active   ferrous sulfate 325 (65 FE) MG EC tablet [id number] No Take 325 mg by  mouth daily with breakfast. [provider] Taking Active Self  furosemide (LASIX) 20 MG tablet [id number] No Take 2 tablets (40 mg total) by mouth daily. [name], Wonda Cheng, MD Taking Active   gabapentin (NEURONTIN) 600 MG tablet [id number] No Take 1 tablet (600 mg total) by mouth 2 (two) times daily. Nafziger, [name], NP Taking Active   hydrocortisone (ANUSOL-HC) 2.5 % rectal cream [id number] No Place 1 application rectally 2 (two) times daily. [name], MD Taking Active Self  LORazepam (ATIVAN) 0.5 MG tablet [id number] No TAKE ONE TABLET BY MOUTH EVERYDAY AT BEDTIME Nafziger, [name], NP Taking Active   metoprolol tartrate (LOPRESSOR) 50 MG tablet [id number] No Take 1.5 tablets (75 mg total) by mouth 2 (two) times daily. [name], NP Taking Expired 07/16/20 2359   nitroGLYCERIN (NITROSTAT) 0.4 MG SL tablet [id number] No Place 1 tablet (0.4 mg total) under the tongue every 5 (five) minutes as needed for chest pain (x 3 pills). Reported on 09/01/2015 [name], NP  Taking Active Self  sertraline (ZOLOFT) 50 MG tablet [id number] No Take 1 tablet (50 mg total) by mouth daily. Nafziger, [name], NP Taking Active   silver sulfADIAZINE (SILVADENE) 1 % cream [id number] No SMARTSIG:1 Sparingly Topical Twice Daily [provider] Taking Active            Med Note Georgiann Cocker   Tue Jun 06, 2020 11:08 AM)    Spacer/Aero-Holding Chambers (E-Z SPACER) inhaler [id number] No Use as instructed [name], MD Taking Active Self  Tiotropium Bromide Monohydrate (SPIRIVA RESPIMAT) 2.5 MCG/ACT AERS [id number]  Inhale 2 puffs into the lungs daily at 2 PM. [name], MD  Active   Tiotropium Bromide Monohydrate (SPIRIVA RESPIMAT) 2.5 MCG/ACT AERS [id number]  Inhale 2 puffs into the lungs daily. [name], MD  Active   traZODone (DESYREL) 50 MG tablet [id number] No Take 1 tablet (50 mg total) by mouth at bedtime. Nafziger, [name], NP Taking Active   triamcinolone cream (KENALOG) 0.1 % [id number] No Apply 1 application topically 2 (two) times daily.  [provider] Taking Active           Patient Active Problem List   Diagnosis Date Noted  . Mediastinal adenopathy 01/21/2020  . Cellulitis 08/03/2019  . CKD (chronic kidney disease), stage III ([place]) 08/03/2019  . Centrilobular emphysema ([place]) 04/06/2019  . Gastric ulcer   . Thrombocytopenia ([place]) 10/27/2017  . Abnormal stress test 10/27/2017  . Moderate mitral regurgitation 10/27/2017  . CAP (community acquired pneumonia) 10/26/2017  . Displaced fracture of lateral malleolus of right fibula, subsequent encounter for closed fracture with routine healing 12/27/2016  . Iron deficiency anemia 11/12/2016  . Chronic anticoagulation 11/12/2016  . Chronic atrial fibrillation ([place]) 08/05/2016  . Coronary artery disease of native artery of native heart with stable angina pectoris ([place]) 08/05/2016  . Failed total left knee replacement ([place]) 08/02/2015  . Sigmoid diverticulitis 11/24/2014  .  Neuropathic pain 06/13/2014  . Insomnia 06/13/2014  . Anxiety and depression 06/13/2014  . Cancer of ascending colon ([place]) 06/13/2014  . Colovaginal fistula 04/20/2014  . Lethargy 09/15/2013  . Chest pain, musculoskeletal 02/19/2011  . Depression 02/07/2010  . Chronic diastolic CHF (congestive heart failure) ([place]) 02/27/2009  . Pure hypercholesterolemia 04/05/2008  . Coronary atherosclerosis 04/05/2008  . Osteoarthritis 05/19/2007  . Essential hypertension 01/21/2007  . GERD 01/21/2007  . Hiatal hernia 01/21/2007  . Diverticulosis of large intestine 01/21/2007  . COLONIC POLYPS, HX OF 01/21/2007    Immunization History  Administered Date(s) Administered  . Fluad Quad(high Dose 65+) 04/05/2019  . Influenza Split 06/21/2011, 04/21/2012  . Influenza Whole 05/19/2007, 04/05/2008, 04/26/2010  . Influenza, High Dose Seasonal PF 05/17/2015, 05/22/2016, 04/11/2017, 04/01/2018  . Influenza,inj,Quad PF,6+ Mos 03/10/2013  . Influenza-Unspecified 04/14/2014, 04/29/2020  . PFIZER(Purple Top)SARS-COV-2 Vaccination 02/02/2020, 02/23/2020  . PPD Test 06/10/2014, 06/24/2014, 08/18/2015  . Pneumococcal Conjugate-13 05/17/2015  . Pneumococcal Polysaccharide-23 02/10/2013  . Tdap 11/19/2010    Conditions to be addressed/monitored:  Hypertension, Hyperlipidemia, Atrial Fibrillation, Heart Failure, GERD, COPD, Chronic Kidney Disease, Depression, Anxiety, Osteoarthritis, Gout and neuropathic pain, insonia, and iron deficiency anemia  There are no care plans that you recently modified to display for this patient.   Current Barriers:  . {pharmacybarriers:24917} . ***  Pharmacist Clinical Goal(s):  Marland Kitchen Patient will {PHARMACYGOALCHOICES:24921} through collaboration with PharmD and provider.  . ***  Interventions: . 1:1 collaboration with [name], NP regarding development and update of comprehensive plan of care as evidenced by provider attestation and co-signature . Inter-disciplinary care  team collaboration (see longitudinal plan of care) . Comprehensive medication review performed; medication list updated in electronic medical record  Hypertension (BP goal {CHL HP UPSTREAM Pharmacist BP ranges:[phone]}) -{US controlled/uncontrolled:25276} -Current treatment:  metoprolol tartrate 65m, 1.5 (one and one-half) tablets twice daily  Losartan 50 mg 1 tablet daily -Medications previously tried: benazepril (cough) -Current home readings: *** (prev: 110-123/70s) -Current dietary habits: *** -Current exercise habits: *** -{ACTIONS;DENIES/REPORTS:21021675::"Denies"} hypotensive/hypertensive symptoms -Educated on {CCM BP Counseling:25124} -Counseled to monitor BP at home ***, document, and provide log at future appointments -{CCMPHARMDINTERVENTION:25122}  Hyperlipidemia/coronary atherosclerosis: (LDL goal < 70) -Uncontrolled -Current treatment:  nitroglycerin 0.460m 1 tablet under tongue every five minutes as needed for chest pain (x 3 pills) -Medications previously tried: simvastatin (myalgias) -Current dietary patterns: *** -Current exercise habits: *** -Educated on {CCM HLD Counseling:25126} -{CCMPHARMDINTERVENTION:25122}  Recommend repeat lipid panel  Atrial Fibrillation (Goal: prevent stroke and major bleeding) -{US controlled/uncontrolled:25276} -CHADSVASC: *** -Current treatment:  Rate control: metoprolol tartrate 501m1.5 (one and one-half) tablets twice daily   Anticoagulation: apixaban (Eliquis) 5mg24mtablet twice daily -Medications previously tried: none -Home BP and HR readings: ***  -Counseled on {CCMAFIBCOUNSELING:25120} -{CCMPHARMDINTERVENTION:25122}  Heart Failure (Goal: manage symptoms and prevent exacerbations) -Controlled -Last ejection fraction: 55-60% (Date: 10/13/17) -HF type: Diastolic -NYHA Class: {CHL HP Upstream Pharm NYHA Class:[phone]} -AHA HF Stage: {CHL HP Upstream Pharm AHA HF Stage:[phone]} -Current treatment:  losartan  50mg54mtablet once daily   metoprolol tartrate 50mg,11m (one and one-half) tablets twice daily  furosemide 20mg, 54mblets once daily -Medications previously tried: spironolactone, benazepril (cough) -Current home BP/HR readings: *** -Current dietary habits: *** -Current exercise habits: *** -Educated on {CCM HF Counseling:25125} -{CCMPHARMDINTERVENTION:25122}  Emphysema (Goal: control symptoms and prevent exacerbations) -{US controlled/uncontrolled:25276} -Current treatment   tiotropium (Spiriva Respimat) 2.5 mcg/ act, inhale 2 puffs into lungs daily  albuterol (Proair HFA) 108 mcg/act inhaler, 2 puffs every six hours as needed for wheezing or shortness of breath  -Medications previously tried: ***  -Gold Grade: {CHL HP Upstream Pharm COPD Gold Grade:2ASTMH:[id number]}nt COPD Classification:  {CHL HP Upstream Pharm COPD Classification:[id number]} -MMRC/CAT score: *** -Pulmonary function testing: *** -Exacerbations requiring treatment in last 6 months: *** -Patient {Actions; denies-reports:120008} consistent use of maintenance inhaler -Frequency of rescue inhaler use: *** -Counseled on {CCMINHALERCOUNSELING:25121} -{CCMPHARMDINTERVENTION:25122}  -Patient assistance with inhalers?  Depression/Anxiety (Goal: ***) -{US controlled/uncontrolled:25276} -Current treatment:  sertraline 50mg, 184mlet once daily   lorazepam 0.5mg, 1 t31met twice daily as needed for anxiety  or sleep -Medications previously tried/failed: n/a -PHQ9: *** -GAD7: *** -Connected with *** for mental health support -Educated on {CCM mental health counseling:25127} -{CCMPHARMDINTERVENTION:25122}   Insomnia (Goal: ***) -{US controlled/uncontrolled:25276} -Current treatment   trazodone 14m, 1 tablet once daily at bedtime   lorazepam 0.561m 1 tablet twice daily as needed for anxiety or sleep -Medications previously tried: ***  -{CCMPHARMDINTERVENTION:25122}   Osteoarthritis (Goal: minimize  pain) -{US controlled/uncontrolled:25276} -Current treatment   acetaminophen (Tylenol) 50044m2 tablets every six hours as needed for moderate pain -Medications previously tried: none  -{CCMPHARMDINTERVENTION:25122}  Gout (Goal: uric acid < 6 and prevent flare ups) -{US controlled/uncontrolled:25276} -Current treatment   colchicine 0.6mg35m needed   Allopurinol 100 mg 1 tablet daily -Medications previously tried: allopurinol (stopped due to rash but rash resolved) -{CCMPHARMDINTERVENTION:25122}  GERD (Goal: ***) -{US controlled/uncontrolled:25276} -Current treatment   esomeprazole 40mg3mcapsule once daily   Famotidine 20 mg 1 tablet daily at bedtime -Medications previously tried: ***  -{CCMPHARMDINTERVENTION:25122} -still coughing?  Iron deficiency anemia (Goal: ***) -{US controlled/uncontrolled:25276} -Current treatment   ferrous sulfate 325mg 50mblet once daily with breakfast -Medications previously tried: none  -{CCMPHARMDINTERVENTION:25122}  Neuropathy (Goal: ***) -{US controlled/uncontrolled:25276} -Current treatment   gabapentin 600mg, 36mblet twice daily  -Medications previously tried: none  -{CCMPHARMDINTERVENTION:25122}  Health Maintenance -Vaccine gaps: shingles, COVID booster? -Current therapy:  . VitamMarland Kitchenn C 1000 mg 1 tablet daily -Educated on {ccm supplement counseling:25128} -{CCM Patient satisfied:25129} -{CCMPHARMDINTERVENTION:25122}   Patient Goals/Self-Care Activities . Patient will:  - {pharmacypatientgoals:24919}  Follow Up Plan: {CM FOLLOW UP PLAN:22QVZD:63875}cation Assistance: {MEDASSISTANCEINFO:25044}  Patient's preferred pharmacy is:  [facility] [place] [address] [phone] box? {Yes or If no, why not?:20788} Pt endorses ***% compliance  We discussed: {Pharmacy options:24294} Patient  decided to: {US Pharmacy Plan:23885}  Care Plan and Follow Up Patient Decision:  {FOLLOWUP:24991}  Plan: {CM FOLLOW UP PLAN:25073}  ***

## 2020-09-27 DIAGNOSIS — I8311 Varicose veins of right lower extremity with inflammation: Secondary | ICD-10-CM | POA: Diagnosis not present

## 2020-10-08 ENCOUNTER — Other Ambulatory Visit: Payer: Self-pay | Admitting: Adult Health

## 2020-10-08 ENCOUNTER — Other Ambulatory Visit: Payer: Self-pay | Admitting: Cardiovascular Disease

## 2020-10-08 DIAGNOSIS — Z760 Encounter for issue of repeat prescription: Secondary | ICD-10-CM

## 2020-10-09 ENCOUNTER — Other Ambulatory Visit: Payer: Self-pay

## 2020-10-09 ENCOUNTER — Telehealth: Payer: Self-pay | Admitting: Pharmacist

## 2020-10-09 MED ORDER — FUROSEMIDE 20 MG PO TABS: 40.0000 mg | ORAL_TABLET | Freq: Every day | ORAL | 3 refills | Status: DC

## 2020-10-09 NOTE — Telephone Encounter (Signed)
Lasix refilled per staff message request by Dr. Acie Fredrickson

## 2020-10-09 NOTE — Chronic Care Management (AMB) (Signed)
Chronic Care Management Pharmacy Assistant   Name: [name]  MRN: [id number] DOB: 25-Jul-1929  Reason for Encounter: Medication Review-Medication Coordination Call   Recent office visits:  . 02.26.2022 Eulas Post, MD Family Medicine o Discontinued Benazepril 40 mg: one tablet by mouth daily o Started Losartan 50 mg tablets One tablet by mouth daily  Recent consult visits:  . 02.10.2022 [name], MD Pulmonary Disease  Hospital visits:  None in previous 6 months  Medications: Outpatient Encounter Medications as of 10/09/2020  Medication Sig  . acetaminophen (TYLENOL) 500 MG tablet Take 1,000 mg by mouth every 6 (six) hours as needed for moderate pain.   Marland Kitchen albuterol (PROAIR HFA) 108 (90 Base) MCG/ACT inhaler Inhale 2 puffs into the lungs every 6 (six) hours as needed for wheezing or shortness of breath.  . allopurinol (ZYLOPRIM) 100 MG tablet TAKE ONE TABLET BY MOUTH ONCE DAILY  . apixaban (ELIQUIS) 5 MG TABS tablet Take 1 tablet (5 mg total) by mouth 2 (two) times daily.  . Ascorbic Acid (VITAMIN C) 1000 MG tablet Take 1,000 mg by mouth daily.  Marland Kitchen doxycycline (VIBRAMYCIN) 100 MG capsule Take 1 capsule (100 mg total) by mouth 2 (two) times daily.  Marland Kitchen esomeprazole (NEXIUM) 40 MG capsule TAKE ONE CAPSULE BY MOUTH ONCE DAILY  . famotidine (PEPCID) 20 MG tablet TAKE ONE TABLET BY MOUTH EVERYDAY AT BEDTIME  . ferrous sulfate 325 (65 FE) MG EC tablet Take 325 mg by mouth daily with breakfast.  . furosemide (LASIX) 20 MG tablet Take 2 tablets (40 mg total) by mouth daily.  Marland Kitchen gabapentin (NEURONTIN) 600 MG tablet Take 1 tablet (600 mg total) by mouth 2 (two) times daily.  . hydrocortisone (ANUSOL-HC) 2.5 % rectal cream Place 1 application rectally 2 (two) times daily.  Marland Kitchen LORazepam (ATIVAN) 0.5 MG tablet TAKE ONE TABLET BY MOUTH EVERYDAY AT BEDTIME  . losartan (COZAAR) 50 MG tablet Take 1 tablet (50 mg total) by mouth daily.  . metoprolol tartrate (LOPRESSOR) 50 MG tablet  Take 1.5 tablets (75 mg total) by mouth 2 (two) times daily.  . nitroGLYCERIN (NITROSTAT) 0.4 MG SL tablet Place 1 tablet (0.4 mg total) under the tongue every 5 (five) minutes as needed for chest pain (x 3 pills). Reported on 09/01/2015  . predniSONE (DELTASONE) 20 MG tablet Take 1 tablet (20 mg total) by mouth daily with breakfast.  . sertraline (ZOLOFT) 50 MG tablet Take 1 tablet (50 mg total) by mouth daily.  . silver sulfADIAZINE (SILVADENE) 1 % cream SMARTSIG:1 Sparingly Topical Twice Daily  . Spacer/Aero-Holding Chambers (E-Z SPACER) inhaler Use as instructed  . Tiotropium Bromide Monohydrate (SPIRIVA RESPIMAT) 2.5 MCG/ACT AERS Inhale 2 puffs into the lungs daily at 2 PM.  . Tiotropium Bromide Monohydrate (SPIRIVA RESPIMAT) 2.5 MCG/ACT AERS Inhale 2 puffs into the lungs daily.  . traZODone (DESYREL) 50 MG tablet Take 1 tablet (50 mg total) by mouth at bedtime.  . triamcinolone cream (KENALOG) 0.1 % Apply 1 application topically 2 (two) times daily.    No facility-administered encounter medications on file as of 10/09/2020.   Reviewed chart for medication changes ahead of medication coordination call. BP Readings from Last 3 Encounters:  08/24/20 126/84  07/24/20 120/84  06/20/20 138/90    Lab Results  Component Value Date   HGBA1C 5.9 (H) 05/31/2014    Patient obtains medications through Vials  90 Days  Last adherence delivery included:  Marland Kitchen Allopurinol 100 mg: one tablet by mouth daily. Marland Kitchen  Furosemide 20 mg: two tablets by mouth daily. . Gabapentin 600 mg: one tablet by mouth 2 (two) times daily. . Trazodone 50 mg: one tablet by mouth at bedtime. . Sertraline 50 mg: one tablet by mouth daily. . Esomeprazole 40 mg: one capsule by mouth daily . Benazepril 40 mg: one tablet by mouth daily. . Metoprolol Tartrate 50 mg: one and half (1.5) twice a day . Lorazepam 0.5 mg one tablet by mouth at bedtime  Patient declined the medication last month due to PRN use/additional supply on  hand. [name] Sulfadiazine 1% topical cream (PRN Use) . Famotidine 20 mg: One tablet by mouth at bedtime. Marland Kitchen Apixaban (ELIQUIS) 5 MG TABS tablet: one tablet by month twice daily (90 DS filled 01.27.2022) . Spiriva Spr 2.5 mcg Inhale two puff my mouth at 2 pm daily. (90 DS filled 02.10.2022) . Albuterol (PROAIR HFA) 108 (90 Base) MCG/ACT inhaler: two puff every 6 hours as needed. (PRN Use) . Triamcinolone cream 0.1% apply twice a day (PRN Use)  Patient is due for next adherence delivery on: 10/17/2020. Called patient and reviewed medications and coordinated delivery. This delivery to include: . Allopurinol 100 mg: one tablet by mouth daily. . Famotidine 20 mg: one tablet by mouth at bedtime . Furosemide 20 mg: two tablets by mouth daily. . Gabapentin 600 mg: one tablet by mouth 2 (two) times daily. . Trazodone 50 mg: one tablet by mouth at bedtime. . Sertraline 50 mg: one tablet by mouth daily. . Esomeprazole 40 mg: one capsule by mouth daily . Lorazepam 0.5 mg one tablet by mouth at bedtime . Metoprolol Tartrate 50 mg: one and half (1.5) twice a day  I spoke with the patient and review medications. There are no changes in medications currently. Patient declined these medication this month due to PRN use/additional supply on hand.The patient is taking the following medications: . Silver Sulfadiazine 1% topical cream (PRN Use) . Apixaban (ELIQUIS) 5 MG TABS tablet: one tablet by month twice daily (90 DS filled 01.27.2022) . Spiriva Spr 2.5 mcg Inhale two puff my mouth at 2 pm daily. (90 DS filled 02.10.2022) . Albuterol (PROAIR HFA) 108 (90 Base) MCG/ACT inhaler: two puff every 6 hours as needed. (PRN Use) . Triamcinolone cream 0.1% apply twice a day (PRN Use) . Losartan (COZAAR) 50 MG: one tablet by mouth daily (30 DS filled 03.26.2022)  Patient needs refills for . Gabapentin 600 mg: one tablet by mouth 2 (two) times daily. . Trazodone 50 mg: one tablet by mouth at  bedtime. . Sertraline 50 mg: one tablet by mouth daily. . Esomeprazole 40 mg: one capsule by mouth daily . Metoprolol Tartrate 50 mg: one and half (1.5) twice a day  Confirmed delivery date of 10/22/2020, advised patient that pharmacy will contact them the morning of delivery.  Star Rating Drugs:  Dispensed Quantity Pharmacy  Losartan 100 mg  03.26.2022 [facility] [place], [place] [phone]

## 2020-10-10 ENCOUNTER — Other Ambulatory Visit: Payer: Self-pay

## 2020-10-17 ENCOUNTER — Other Ambulatory Visit: Payer: Self-pay | Admitting: Family Medicine

## 2020-10-29 ENCOUNTER — Other Ambulatory Visit: Payer: Self-pay | Admitting: Adult Health

## 2020-11-01 NOTE — Telephone Encounter (Signed)
Name of Medication: ativan Name of [facility]acy: [facility] Last Fill or Written Date and Quantity: 08/09/20 #30tabs/ 2 refill Last Office Visit and Type: 09/11/20 virtual for congestion Next Office Visit and Type: none scheduled with PCP

## 2020-11-10 ENCOUNTER — Other Ambulatory Visit: Payer: Self-pay

## 2020-11-10 ENCOUNTER — Inpatient Hospital Stay (HOSPITAL_BASED_OUTPATIENT_CLINIC_OR_DEPARTMENT_OTHER)
Admission: EM | Admit: 2020-11-10 | Discharge: 2020-11-13 | DRG: 291 | Disposition: A | Discharge status: Home Health | Payer: HMO | Attending: Internal Medicine | Admitting: Internal Medicine

## 2020-11-10 ENCOUNTER — Emergency Department (HOSPITAL_BASED_OUTPATIENT_CLINIC_OR_DEPARTMENT_OTHER): Payer: HMO

## 2020-11-10 ENCOUNTER — Ambulatory Visit: Payer: HMO | Admitting: Family Medicine

## 2020-11-10 ENCOUNTER — Telehealth: Payer: HMO | Admitting: Family Medicine

## 2020-11-10 ENCOUNTER — Emergency Department (HOSPITAL_BASED_OUTPATIENT_CLINIC_OR_DEPARTMENT_OTHER): Payer: HMO | Admitting: Radiology

## 2020-11-10 ENCOUNTER — Encounter (HOSPITAL_BASED_OUTPATIENT_CLINIC_OR_DEPARTMENT_OTHER): Payer: Self-pay | Admitting: *Deleted

## 2020-11-10 ENCOUNTER — Telehealth: Payer: Self-pay

## 2020-11-10 DIAGNOSIS — R040 Epistaxis: Secondary | ICD-10-CM | POA: Diagnosis present

## 2020-11-10 DIAGNOSIS — Z9049 Acquired absence of other specified parts of digestive tract: Secondary | ICD-10-CM | POA: Diagnosis not present

## 2020-11-10 DIAGNOSIS — Z96653 Presence of artificial knee joint, bilateral: Secondary | ICD-10-CM | POA: Diagnosis present

## 2020-11-10 DIAGNOSIS — D509 Iron deficiency anemia, unspecified: Secondary | ICD-10-CM | POA: Diagnosis present

## 2020-11-10 DIAGNOSIS — Z20822 Contact with and (suspected) exposure to covid-19: Secondary | ICD-10-CM | POA: Diagnosis present

## 2020-11-10 DIAGNOSIS — J449 Chronic obstructive pulmonary disease, unspecified: Secondary | ICD-10-CM | POA: Diagnosis present

## 2020-11-10 DIAGNOSIS — Z7952 Long term (current) use of systemic steroids: Secondary | ICD-10-CM

## 2020-11-10 DIAGNOSIS — Z7901 Long term (current) use of anticoagulants: Secondary | ICD-10-CM | POA: Diagnosis not present

## 2020-11-10 DIAGNOSIS — Z85038 Personal history of other malignant neoplasm of large intestine: Secondary | ICD-10-CM

## 2020-11-10 DIAGNOSIS — I517 Cardiomegaly: Secondary | ICD-10-CM | POA: Diagnosis not present

## 2020-11-10 DIAGNOSIS — I2510 Atherosclerotic heart disease of native coronary artery without angina pectoris: Secondary | ICD-10-CM | POA: Diagnosis present

## 2020-11-10 DIAGNOSIS — Z8249 Family history of ischemic heart disease and other diseases of the circulatory system: Secondary | ICD-10-CM

## 2020-11-10 DIAGNOSIS — Z87891 Personal history of nicotine dependence: Secondary | ICD-10-CM | POA: Diagnosis not present

## 2020-11-10 DIAGNOSIS — I10 Essential (primary) hypertension: Secondary | ICD-10-CM | POA: Diagnosis not present

## 2020-11-10 DIAGNOSIS — I5033 Acute on chronic diastolic (congestive) heart failure: Secondary | ICD-10-CM | POA: Diagnosis present

## 2020-11-10 DIAGNOSIS — F32A Depression, unspecified: Secondary | ICD-10-CM | POA: Diagnosis present

## 2020-11-10 DIAGNOSIS — F419 Anxiety disorder, unspecified: Secondary | ICD-10-CM | POA: Diagnosis present

## 2020-11-10 DIAGNOSIS — N1832 Chronic kidney disease, stage 3b: Secondary | ICD-10-CM | POA: Diagnosis present

## 2020-11-10 DIAGNOSIS — Z800 Family history of malignant neoplasm of digestive organs: Secondary | ICD-10-CM

## 2020-11-10 DIAGNOSIS — R531 Weakness: Secondary | ICD-10-CM | POA: Diagnosis present

## 2020-11-10 DIAGNOSIS — Z760 Encounter for issue of repeat prescription: Secondary | ICD-10-CM

## 2020-11-10 DIAGNOSIS — K219 Gastro-esophageal reflux disease without esophagitis: Secondary | ICD-10-CM | POA: Diagnosis present

## 2020-11-10 DIAGNOSIS — I509 Heart failure, unspecified: Secondary | ICD-10-CM | POA: Diagnosis not present

## 2020-11-10 DIAGNOSIS — R52 Pain, unspecified: Secondary | ICD-10-CM | POA: Diagnosis not present

## 2020-11-10 DIAGNOSIS — K449 Diaphragmatic hernia without obstruction or gangrene: Secondary | ICD-10-CM | POA: Diagnosis not present

## 2020-11-10 DIAGNOSIS — J9859 Other diseases of mediastinum, not elsewhere classified: Secondary | ICD-10-CM | POA: Diagnosis present

## 2020-11-10 DIAGNOSIS — I4821 Permanent atrial fibrillation: Secondary | ICD-10-CM | POA: Diagnosis present

## 2020-11-10 DIAGNOSIS — I130 Hypertensive heart and chronic kidney disease with heart failure and stage 1 through stage 4 chronic kidney disease, or unspecified chronic kidney disease: Principal | ICD-10-CM | POA: Diagnosis present

## 2020-11-10 DIAGNOSIS — Z79899 Other long term (current) drug therapy: Secondary | ICD-10-CM

## 2020-11-10 DIAGNOSIS — Z8673 Personal history of transient ischemic attack (TIA), and cerebral infarction without residual deficits: Secondary | ICD-10-CM

## 2020-11-10 DIAGNOSIS — Z8051 Family history of malignant neoplasm of kidney: Secondary | ICD-10-CM

## 2020-11-10 DIAGNOSIS — R110 Nausea: Secondary | ICD-10-CM | POA: Diagnosis not present

## 2020-11-10 DIAGNOSIS — R0602 Shortness of breath: Principal | ICD-10-CM

## 2020-11-10 LAB — RESP PANEL BY RT-PCR (FLU A&B, COVID) ARPGX2
Influenza A by PCR: NEGATIVE
Influenza B by PCR: NEGATIVE
SARS Coronavirus 2 by RT PCR: NEGATIVE

## 2020-11-10 LAB — CBC WITH DIFFERENTIAL/PLATELET
Abs Immature Granulocytes: 0.02 10*3/uL (ref 0.00–0.07)
Basophils Absolute: 0 10*3/uL (ref 0.0–0.1)
Basophils Relative: 0 %
Eosinophils Absolute: 0 10*3/uL (ref 0.0–0.5)
Eosinophils Relative: 0 %
HCT: 40.1 % (ref 36.0–46.0)
Hemoglobin: 12.5 g/dL (ref 12.0–15.0)
Immature Granulocytes: 0 %
Lymphocytes Relative: 11 %
Lymphs Abs: 1 10*3/uL (ref 0.7–4.0)
MCH: 29.8 pg (ref 26.0–34.0)
MCHC: 31.2 g/dL (ref 30.0–36.0)
MCV: 95.7 fL (ref 80.0–100.0)
Monocytes Absolute: 0.8 10*3/uL (ref 0.1–1.0)
Monocytes Relative: 8 %
Neutro Abs: 7.9 10*3/uL — ABNORMAL HIGH (ref 1.7–7.7)
Neutrophils Relative %: 81 %
Platelets: 136 10*3/uL — ABNORMAL LOW (ref 150–400)
RBC: 4.19 MIL/uL (ref 3.87–5.11)
RDW: 16.4 % — ABNORMAL HIGH (ref 11.5–15.5)
WBC: 9.9 10*3/uL (ref 4.0–10.5)
nRBC: 0 % (ref 0.0–0.2)

## 2020-11-10 LAB — COMPREHENSIVE METABOLIC PANEL
ALT: 17 U/L (ref 0–44)
AST: 32 U/L (ref 15–41)
Albumin: 4.1 g/dL (ref 3.5–5.0)
Alkaline Phosphatase: 58 U/L (ref 38–126)
Anion gap: 11 (ref 5–15)
BUN: 30 mg/dL — ABNORMAL HIGH (ref 8–23)
CO2: 27 mmol/L (ref 22–32)
Calcium: 9.6 mg/dL (ref 8.9–10.3)
Chloride: 104 mmol/L (ref 98–111)
Creatinine, Ser: 0.98 mg/dL (ref 0.44–1.00)
GFR, Estimated: 56 mL/min — ABNORMAL LOW (ref >60)
Glucose, Bld: 114 mg/dL — ABNORMAL HIGH (ref 70–99)
Potassium: 4 mmol/L (ref 3.5–5.1)
Sodium: 142 mmol/L (ref 135–145)
Total Bilirubin: 1 mg/dL (ref 0.3–1.2)
Total Protein: 7.1 g/dL (ref 6.5–8.1)

## 2020-11-10 LAB — TROPONIN I (HIGH SENSITIVITY): Troponin I (High Sensitivity): 7 ng/L (ref <18)

## 2020-11-10 LAB — LIPASE, BLOOD: Lipase: 31 U/L (ref 11–51)

## 2020-11-10 LAB — BRAIN NATRIURETIC PEPTIDE: B Natriuretic Peptide: 490.6 pg/mL — ABNORMAL HIGH (ref 0.0–100.0)

## 2020-11-10 MED ORDER — TRAZODONE HCL 50 MG PO TABS
50.0000 mg | ORAL_TABLET | Freq: Every evening | ORAL | Status: DC | PRN
  Administered 2020-11-11 – 2020-11-12 (×2): 50 mg via ORAL
  Filled 2020-11-10 (×2): qty 1

## 2020-11-10 MED ORDER — ACETAMINOPHEN 325 MG PO TABS
650.0000 mg | ORAL_TABLET | Freq: Four times a day (QID) | ORAL | Status: DC | PRN
  Administered 2020-11-10 – 2020-11-13 (×4): 650 mg via ORAL
  Filled 2020-11-10 (×4): qty 2

## 2020-11-10 MED ORDER — APIXABAN 5 MG PO TABS
5.0000 mg | ORAL_TABLET | Freq: Two times a day (BID) | ORAL | Status: DC
  Administered 2020-11-10 – 2020-11-13 (×6): 5 mg via ORAL
  Filled 2020-11-10 (×5): qty 1

## 2020-11-10 MED ORDER — ONDANSETRON HCL 4 MG/2ML IJ SOLN
4.0000 mg | Freq: Four times a day (QID) | INTRAMUSCULAR | Status: DC | PRN
  Administered 2020-11-11: 4 mg via INTRAVENOUS
  Filled 2020-11-10: qty 2

## 2020-11-10 MED ORDER — LORAZEPAM 0.5 MG PO TABS
0.5000 mg | ORAL_TABLET | Freq: Two times a day (BID) | ORAL | Status: DC | PRN
  Administered 2020-11-11 – 2020-11-12 (×3): 0.5 mg via ORAL
  Filled 2020-11-10 (×3): qty 1

## 2020-11-10 MED ORDER — ALLOPURINOL 100 MG PO TABS
100.0000 mg | ORAL_TABLET | Freq: Every day | ORAL | Status: DC
  Administered 2020-11-11 – 2020-11-13 (×3): 100 mg via ORAL
  Filled 2020-11-10 (×3): qty 1

## 2020-11-10 MED ORDER — SERTRALINE HCL 50 MG PO TABS
50.0000 mg | ORAL_TABLET | Freq: Every day | ORAL | Status: DC
  Administered 2020-11-11 – 2020-11-13 (×3): 50 mg via ORAL
  Filled 2020-11-10 (×3): qty 1

## 2020-11-10 MED ORDER — FUROSEMIDE 10 MG/ML IJ SOLN
40.0000 mg | Freq: Once | INTRAMUSCULAR | Status: AC
  Administered 2020-11-10: 40 mg via INTRAVENOUS
  Filled 2020-11-10: qty 4

## 2020-11-10 MED ORDER — ACETAMINOPHEN 500 MG PO TABS
1000.0000 mg | ORAL_TABLET | Freq: Once | ORAL | Status: AC
  Administered 2020-11-10: 1000 mg via ORAL
  Filled 2020-11-10: qty 2

## 2020-11-10 MED ORDER — POTASSIUM CHLORIDE CRYS ER 20 MEQ PO TBCR
20.0000 meq | EXTENDED_RELEASE_TABLET | Freq: Every day | ORAL | Status: AC
  Administered 2020-11-11 – 2020-11-12 (×2): 20 meq via ORAL
  Filled 2020-11-10 (×2): qty 1

## 2020-11-10 MED ORDER — IOHEXOL 350 MG/ML SOLN
100.0000 mL | Freq: Once | INTRAVENOUS | Status: AC | PRN
  Administered 2020-11-10: 100 mL via INTRAVENOUS

## 2020-11-10 MED ORDER — FUROSEMIDE 10 MG/ML IJ SOLN
20.0000 mg | Freq: Two times a day (BID) | INTRAMUSCULAR | Status: DC
  Administered 2020-11-11 – 2020-11-12 (×3): 20 mg via INTRAVENOUS
  Filled 2020-11-10 (×3): qty 2

## 2020-11-10 MED ORDER — FERROUS SULFATE 325 (65 FE) MG PO TABS
325.0000 mg | ORAL_TABLET | Freq: Every day | ORAL | Status: DC
  Administered 2020-11-11 – 2020-11-13 (×3): 325 mg via ORAL
  Filled 2020-11-10 (×3): qty 1

## 2020-11-10 NOTE — ED Triage Notes (Signed)
Feeling weak s for 2 weeks and feeling nauseous when she gets up and short of breathe and it is progressingly worst.

## 2020-11-10 NOTE — Telephone Encounter (Signed)
Patient's daughter refused ED.   Nurse Assessment Nurse: Alveta Heimlich, RN, Rise Paganini Date/Time (Eastern Time): 11/10/2020 10:27:17 AM Confirm and document reason for call. If symptomatic, describe symptoms. ---Caller states her mom started feeling very weak the early part of week. Started with cough and headache with sore throat on Tuesday. No fever that they know of. She has chills but does not feel hot. She is checking temp now. It is 98.5. She has vaccinated for covid and flu. Does the patient have any new or worsening symptoms? ---Yes Will a triage be completed? ---Yes Related visit to physician within the last 2 weeks? ---No Does the PT have any chronic conditions? (i.e. diabetes, asthma, this includes High risk factors for pregnancy, etc.) ---Yes List chronic conditions. ---COPD Is this a behavioral health or substance abuse call? ---No Guidelines Guideline Title Affirmed Question Affirmed Notes Nurse Date/Time ([place] Time) COVID-19 - Diagnosed or Suspected MODERATE difficulty breathing (e.g., speaks in phrases, SOB even at rest, pulse 100-120) Alveta Heimlich, RN, [facility] 11/10/2020 10:30:46 AM PLEASE NOTE: All timestamps contained within this report are represented as [place] Standard Time. CONFIDENTIALTY NOTICE: This fax transmission is intended only for the addressee. It contains information that is legally privileged, confidential or otherwise protected from use or disclosure. If you are not the intended recipient, you are strictly prohibited from reviewing, disclosing, copying using or disseminating any of this information or taking any action in reliance on or regarding this information. If you have received this fax in error, please notify us immediately by telephone so that we can arrange for its return to [place]. Phone: [phone], Toll-Free: [phone], Fax: [phone] Page: 2 of 2 Call Id: [id number] [place]. Time Eilene Ghazi Time) Disposition Final User 11/10/2020 10:34:23 AM Go to  ED Now Yes Alveta Heimlich, RN, Ali Lowe Disagree/Comply Disagree Caller Understands Yes PreDisposition Call Doctor Care Advice Given Per Guideline GO TO ED NOW: ANOTHER ADULT SHOULD DRIVE: CALL EMS 151 IF: * Severe difficulty breathing occurs * Lips or face turns blue * Confusion occurs. CARE ADVICE given per COVID-19 - DIAGNOSED OR SUSPECTED (Adult) guideline. Comments User: Debby Bud, RN Date/Time Eilene Ghazi Time): 11/10/2020 10:36:12 AM She just cannot do ER. She has had terrible experiences and her daughter will take her to urgent care, clinic. Referrals GO TO FACILITY UNDECIDE

## 2020-11-10 NOTE — ED Notes (Signed)
Pt called RN to room, complains of increases shortness of breath and severe headache. VSS, sats are still 92%, Md made aware . 2 liters nasal cannula added as well per orders.

## 2020-11-10 NOTE — H&P (Addendum)
History and Physical  [name] MAU:[id number] DOB: 08-Feb-1930 DOA: 11/10/2020  Referring physician: Direct admit from [facility].  Accepted by Dr. Pietro Cassis, [facility]. PCP: [name], NP  Outpatient Specialists: Cardiology Patient coming from: Home through [facility] ED  Chief Complaint: Worsening shortness of breath x2 days.  HPI: [name] is a 87 y.o. female with medical history significant for centrilobular emphysema follows with pulmonary, former smoker, permanent atrial fibrillation, colon cancer s/p resection, GERD, essential hypertension, who initially presented to [place] ED due to gradually worsening shortness of breath of 3 days duration associated with productive sputum with gray color.  Reports temperature of 99 at home.  Also reports intermittent headache.  No chest pain.  Has felt weaker.  Uses a walker.  CTA PE was done in the ED, no evidence of acute PE, showed new enlarged 1.5 cm mediastinal node adjacent to the right bronchus intermedius.  Patient was transferred to [facility].  TRH was asked to admit.  ED Course: Afebrile.  BP 139/79, pulse 77, respiration rate 18, O2 saturation 98% on 2 L.  Review of Systems: Review of systems as noted in the HPI. All other systems reviewed and are negative.   Past Medical History:  Diagnosis Date  . Adenocarcinoma, colon [facility]) Oncologist-- Dr Truitt Merle   Multifocal (2) Colon cancer @ ileocecal valve and ascending ( [id number]), Stage IIIB, grade I, MMR normal , 2 of 16 +lymph nodes, negative surgical margins---  06-02-2014  Right hemicolectomy w/ colostomy  . Allergy   . Anxiety   . Asthma    inhaler "sometimes"  . CAD (coronary artery disease)    a. LHC 4/09: pLAD 40, mDx 70-75, pLCx 30, mLCx 50, mRCA 90, EF 60% >> PCI: BMS x2 to RCA;  b. Myoview 8/12: normal  . Cataract    bil cateracts removed  . Chronic anemia   . Chronic diastolic CHF (congestive heart failure) (HCC)    a. Echo 912 - Mild LVH, EF 55-60%, no RWMA, Gr 1 DD, mild  MR, mild LAE, mild RAE, PASP 59 mmHg (mod to severe pulmo HTN)  . Clotting disorder (HCC)    hx of dvt, tia on eliquis  . Colostomy in place [facility])    s/p colostomy takedown 5/16  . Colovaginal fistula    s/p colostomy >> colostomy takedown 5/16  . COPD with chronic bronchitis ([place])   . Depression   . Dysrhythmia    A fib  . Essential hypertension   . GERD (gastroesophageal reflux disease)   . History of adenomatous polyp of colon   . History of blood transfusion   . History of DVT of lower extremity   . History of hiatal hernia   . History of TIA (transient ischemic attack)    a. Head CT 6/15: small chronic lacunar infarct in thalamus  . Hyperlipidemia   . OA (osteoarthritis)   . Osteoporosis   . Peripheral neuropathy   . Pneumonia   . Pulmonary HTN ([place])    a. PASP on Echo in 9/12:  59 mmHg  . Renal insufficiency   . Sigmoid diverticulosis    s/p sigmoid colectomy  . Stroke ([place])    TIAs  . Wears dentures   . Wears glasses    Past Surgical History:  Procedure Laterality Date  . ABDOMINAL HYSTERECTOMY    . ANTERIOR CERVICAL DECOMP/DISCECTOMY FUSION  01-31-2009   C5 -- 7  . APPENDECTOMY    . Bone spurs Bilateral  Feet  . CARDIOVASCULAR STRESS TEST  02-26-2011   Normal lexiscan no exercise study/  no ischemia/  normal LV function and wall motion, ef 67%  . CARPAL TUNNEL RELEASE Right   . CATARACT EXTRACTION W/ INTRAOCULAR LENS  IMPLANT, BILATERAL Bilateral   . CHOLECYSTECTOMY    . COLOSTOMY N/A 06/02/2014   Procedure: DIVERTING DESCENDING END COLOSTOMY;  Surgeon: Donnie Mesa, MD;  Location: [place];  Service: General;  Laterality: N/A;  . CORONARY ANGIOPLASTY WITH STENT PLACEMENT  11-04-2007  dr bensimhon   BMS x2 to RCA/  mild Non-obstructive disease LAD/  normal LVF  . DILATION AND CURETTAGE OF UTERUS    . ESOPHAGOGASTRODUODENOSCOPY N/A 10/28/2017   Procedure: ESOPHAGOGASTRODUODENOSCOPY (EGD);  Surgeon: Yetta Flock, MD;  Location: [facility] ENDOSCOPY;  Service:  Gastroenterology;  Laterality: N/A;  . EVALUATION UNDER ANESTHESIA WITH ANAL FISTULECTOMY N/A 10/13/2014   Procedure: ANAL EXAM UNDER ANESTHESIA ;  Surgeon: [name], MD;  Location: [facility];  Service: General;  Laterality: N/A;  . HAND SURGERY     Tendon repair  . LAPAROSCOPIC SIGMOID COLECTOMY N/A 11/24/2014   Procedure: SIGMOID COLECTOMY AND COLSOTOMY CLOSURE;  Surgeon: Donnie Mesa, MD;  Location: [place];  Service: General;  Laterality: N/A;  . LUMBAR LAMINECTOMY  10-29-2002,  1969   Left  L3 -- 4  decompression  . PARTIAL COLECTOMY N/A 06/02/2014   Procedure: RIGHT PARTIAL COLECTOMY ;  Surgeon: Donnie Mesa, MD;  Location: [place];  Service: General;  Laterality: N/A;  . PATELLECTOMY Right 09/14/2013   Procedure: PATELLECTOMY;  Surgeon: Meredith Pel, MD;  Location: [place];  Service: Orthopedics;  Laterality: Right;  . PROCTOSCOPY N/A 10/13/2014   Procedure: RIDGE PROCTOSCOPY;  Surgeon: [name], MD;  Location: [facility];  Service: General;  Laterality: N/A;  . REVISION TOTAL KNEE ARTHROPLASTY Bilateral right  04-02-2011/  left 1996 & 10-05-1999  . ROTATOR CUFF REPAIR Bilateral   . TONSILLECTOMY    . TOTAL KNEE ARTHROPLASTY Bilateral left 1994/  right 2000  . TOTAL KNEE REVISION Left 08/02/2015   Procedure: LEFT FEMORAL REVISION;  Surgeon: Gaynelle Arabian, MD;  Location: [facility];  Service: Orthopedics;  Laterality: Left;  . TRANSTHORACIC ECHOCARDIOGRAM  12-01-2009   Grade I diastolic dysfunction/  ef 60%/  moderate MR/  mild TR    Social History:  reports that she quit smoking about 53 years ago. Her smoking use included cigarettes. She started smoking about 69 years ago. She has a 8.00 pack-year smoking history. She has never used smokeless tobacco. She reports that she does not drink alcohol and does not use drugs.   No Known Allergies  Family History  Problem Relation Age of Onset  . Osteoarthritis Mother   . Heart failure Mother   . Colon cancer Maternal Uncle 61  . Heart  Problems Father   . Cancer Sister 66       ? colon cancer   . Kidney cancer Brother 35  . Esophageal cancer Neg Hx   . Rectal cancer Neg Hx   . Stomach cancer Neg Hx   . Pancreatic cancer Neg Hx       Prior to Admission medications   Medication Sig Start Date End Date Taking? Authorizing Provider  acetaminophen (TYLENOL) 500 MG tablet Take 1,000 mg by mouth every 6 (six) hours as needed for moderate pain.     [provider]  albuterol (PROAIR HFA) 108 (90 Base) MCG/ACT inhaler Inhale 2 puffs into the lungs every 6 (  six) hours as needed for wheezing or shortness of breath. 04/05/19   [name], MD  allopurinol (ZYLOPRIM) 100 MG tablet TAKE ONE TABLET BY MOUTH ONCE DAILY 07/11/20   Nafziger, [name], NP  apixaban (ELIQUIS) 5 MG TABS tablet Take 1 tablet (5 mg total) by mouth 2 (two) times daily. 08/03/20   Nafziger, [name], NP  Ascorbic Acid (VITAMIN C) 1000 MG tablet Take 1,000 mg by mouth daily.    [provider]  doxycycline (VIBRAMYCIN) 100 MG capsule Take 1 capsule (100 mg total) by mouth 2 (two) times daily. 09/11/20   Burchette, Alinda Sierras, MD  esomeprazole (NEXIUM) 40 MG capsule TAKE ONE CAPSULE BY MOUTH ONCE DAILY 10/10/20   Nafziger, [name], NP  famotidine (PEPCID) 20 MG tablet TAKE ONE TABLET BY MOUTH EVERYDAY AT BEDTIME 04/22/20   Martyn Ehrich, NP  ferrous sulfate 325 (65 FE) MG EC tablet Take 325 mg by mouth daily with breakfast.    [provider]  furosemide (LASIX) 20 MG tablet Take 2 tablets (40 mg total) by mouth daily. 10/09/20   [name], Wonda Cheng, MD  gabapentin (NEURONTIN) 600 MG tablet TAKE ONE TABLET BY MOUTH TWICE DAILY 10/10/20   Nafziger, [name], NP  hydrocortisone (ANUSOL-HC) 2.5 % rectal cream Place 1 application rectally 2 (two) times daily. 07/30/19   [name], MD  LORazepam (ATIVAN) 0.5 MG tablet TAKE ONE TABLET BY MOUTH EVERYDAY AT BEDTIME 11/01/20   Nafziger, [name], NP  losartan (COZAAR) 50 MG tablet Take 1 tablet by mouth once daily  10/17/20   Burchette, Alinda Sierras, MD  metoprolol tartrate (LOPRESSOR) 50 MG tablet TAKE 1 AND 1/2 TABLETS BY MOUTH TWICE DAILY 10/10/20   Nafziger, [name], NP  nitroGLYCERIN (NITROSTAT) 0.4 MG SL tablet Place 1 tablet (0.4 mg total) under the tongue every 5 (five) minutes as needed for chest pain (x 3 pills). Reported on 09/01/2015 04/06/19   [name], NP  predniSONE (DELTASONE) 20 MG tablet Take 1 tablet (20 mg total) by mouth daily with breakfast. 09/11/20   Eulas Post, MD  sertraline (ZOLOFT) 50 MG tablet TAKE ONE TABLET BY MOUTH ONCE DAILY 10/10/20   Nafziger, [name], NP  silver sulfADIAZINE (SILVADENE) 1 % cream SMARTSIG:1 Sparingly Topical Twice Daily 01/31/20   [provider]  Spacer/Aero-Holding Chambers (E-Z SPACER) inhaler Use as instructed 03/20/18   [name], MD  Tiotropium Bromide Monohydrate (SPIRIVA RESPIMAT) 2.5 MCG/ACT AERS Inhale 2 puffs into the lungs daily at 2 PM. 08/24/20 09/23/20  [name], MD  Tiotropium Bromide Monohydrate (SPIRIVA RESPIMAT) 2.5 MCG/ACT AERS Inhale 2 puffs into the lungs daily. 08/24/20   [name], MD  traZODone (DESYREL) 50 MG tablet TAKE ONE TABLET BY MOUTH EVERYDAY AT BEDTIME 10/10/20   Nafziger, [name], NP  triamcinolone cream (KENALOG) 0.1 % Apply 1 application topically 2 (two) times daily.     [provider]    Physical Exam: BP 108/68 (BP Location: Right Arm)   Pulse 82   Temp 98 F (36.7 C) (Oral)   Resp 18   Ht 5' 3" (1.6 m)   Wt 83 kg   SpO2 100%   BMI 32.42 kg/m   . General: 87 y.o. year-old female well developed well nourished in no acute distress.  Alert and oriented x3. . Cardiovascular: Regular rate and rhythm with no rubs or gallops.  No thyromegaly or JVD noted.  No lower extremity edema. 2/4 pulses in all 4 extremities. Marland Kitchen Respiratory: Mild rales  at bases.  Poor inspiratory effort. . Abdomen: Soft nontender nondistended with normal bowel sounds x4 quadrants. . Muskuloskeletal: No cyanosis,  clubbing or edema noted bilaterally . Neuro: CN II-XII intact, strength, sensation, reflexes . Skin: No ulcerative lesions noted or rashes . Psychiatry: Judgement and insight appear normal. Mood is appropriate for condition and setting          Labs on Admission:  Basic Metabolic Panel: Recent Labs  Lab 11/10/20 1311  NA 142  K 4.0  CL 104  CO2 27  GLUCOSE 114*  BUN 30*  CREATININE 0.98  CALCIUM 9.6   Liver Function Tests: Recent Labs  Lab 11/10/20 1311  AST 32  ALT 17  ALKPHOS 58  BILITOT 1.0  PROT 7.1  ALBUMIN 4.1   Recent Labs  Lab 11/10/20 1311  LIPASE 31   No results for input(s): AMMONIA in the last 168 hours. CBC: Recent Labs  Lab 11/10/20 1400  WBC 9.9  NEUTROABS 7.9*  HGB 12.5  HCT 40.1  MCV 95.7  PLT 136*   Cardiac Enzymes: No results for input(s): CKTOTAL, CKMB, CKMBINDEX, TROPONINI in the last 168 hours.  BNP (last 3 results) Recent Labs    11/10/20 1400  BNP 490.6*    ProBNP (last 3 results) No results for input(s): PROBNP in the last 8760 hours.  CBG: No results for input(s): GLUCAP in the last 168 hours.  Radiological Exams on Admission: CT Angio Chest PE W/Cm &/Or Wo Cm  Result Date: 11/10/2020 CLINICAL DATA:  Progressive shortness of breath EXAM: CT ANGIOGRAPHY CHEST WITH CONTRAST TECHNIQUE: Multidetector CT imaging of the chest was performed using the standard protocol during bolus administration of intravenous contrast. Multiplanar CT image reconstructions and MIPs were obtained to evaluate the vascular anatomy. CONTRAST:  139m OMNIPAQUE IOHEXOL 350 MG/ML SOLN COMPARISON:  03/23/2020 FINDINGS: Cardiovascular: Satisfactory opacification of the pulmonary arteries to the segmental level. No evidence of pulmonary embolism. Cardiomegaly with biatrial enlargement. Coronary artery calcification. No pericardial effusion. Thoracic aorta is normal in caliber with mild calcified plaque. Mediastinum/Nodes: Enlarged node adjacent to the right  bronchus intermedius measuring 1.5 cm short axis (series 4, image 82) additional nonenlarged mediastinal and hilar nodes. Hiatal hernia. Lungs/Pleura: No pleural effusion or pneumothorax. Mosaic attenuation. Upper Abdomen: No acute abnormality. Musculoskeletal: Lower cervical ACDF. Degenerative changes of the thoracic spine. Review of the MIP images confirms the above findings. IMPRESSION: No evidence of acute pulmonary embolism. Mosaic attenuation likely reflect obstructing small airways disease or edema. Cardiomegaly.  Coronary and aortic atherosclerosis. New enlarged 1.5 cm mediastinal node adjacent to the right bronchus intermedius. Electronically Signed   By: PMacy MisM.D.   On: 11/10/2020 16:14   DG Chest Port 1 View  Result Date: 11/10/2020 CLINICAL DATA:  Weakness and nausea over the last 2 weeks. EXAM: PORTABLE CHEST 1 VIEW COMPARISON:  08/02/2019 FINDINGS: Heart size upper limits of normal. Atherosclerosis and tortuosity of the aorta. The upper lungs are clear. Question mild atelectasis or infiltrate at the lung bases. Consider two-view chest radiography if concern persists. No dense consolidation or lobar collapse. No visible effusion. No acute bone finding. Previous distal clavicular resections and ACDF. IMPRESSION: 1. Question mild atelectasis or infiltrate at the lung bases. Consider two-view chest radiography if concern persists. 2. No dense consolidation or lobar collapse. Electronically Signed   By: MNelson ChimesM.D.   On: 11/10/2020 14:46    EKG: I independently viewed the EKG done and my findings are as followed: A. fib 104,  nonspecific ST-T changes.  QTc 448.  Assessment/Plan Present on Admission: **None**  Active Problems:   CHF exacerbation (HCC)  Acute on chronic diastolic CHF Last 2D echo done on 10/28/2017 revealed LVEF 55 to 60% Presented with elevated BNP 490, shortness of breath with minimal exertion Personally reviewed chest x-ray done on admission with increase  in pulmonary vascularity suggestive of mild pulmonary edema. Repeat 2D echo. Start strict I's and O's and daily weight Continue diuretics, IV Lasix 20 mg twice daily.  Permanent A. Fib Resume home p.o. Lopressor for rate control Continue Eliquis for CVA prevention Continue to monitor on telemetry.  Iron deficiency anemia Resume home ferrous sulfate  Chronic anxiety/depression Resume home Zoloft  GERD Resume home PPI  COPD Resume home regimen She is on chronic steroid use, prednisone 20 mg daily.  New enlarged 1.5 cm mediastinal node adjacent to the right bronchus intermedius.  Recommend close follow-up with pulmonary.  Generalized weakness Uses a walker to ambulate PT OT to assess Fall precautions    DVT prophylaxis: Eliquis.  Code Status: Full code as stated by the patient herself.  Family Communication: None at bedside.  Disposition Plan: Admit to telemetry cardiac.  Consults called: None.  Admission status: Observation status.   Status is: Observation    Dispo: The patient is from: Home.               Anticipated d/c is to: Home.              Patient currently not stable for discharge, ongoing diuresing    Difficult to place patient, not applicable.        Kayleen Memos MD [facility] Pager [phone]  If 7PM-7AM, please contact night-coverage [email or web address] Password [facility]  11/10/2020, 9:42 PM

## 2020-11-10 NOTE — Progress Notes (Signed)
87 yo female  H/o diastolic chf Presented to [facility] ED with C/o worsening weakness, dyspnea on exertion Likely CHF exacerbation. IV lasix one dose given. CT angio chest negative. Accepted to a cardiac tele bed.

## 2020-11-10 NOTE — ED Provider Notes (Signed)
Blood pressure (!) 150/86, pulse 88, temperature 98.4 F (36.9 C), temperature source Oral, resp. rate 20, height 5\' 3"  (1.6 m), weight 79.4 kg, SpO2 99 %.  Assuming care from Dr. Rex Kras.  In short, [name] is a 87 y.o. female with a chief complaint of Weakness, Nausea, and Shortness of Breath .  Refer to the original H&P for additional details.  The current plan of care is to f/u on CTA. Patient with increasing DOE and nausea. No CP. Also has a HA here. CTA pending. Infiltrate vs atelectasis on CXR. BNP slightly elevated. Sats here in the low 90s with RR in the 30s improved with [place] O2. COVID/Flu are pending.    EKG Interpretation  Date/Time:  Friday November 10 2020 12:53:49 EDT Ventricular Rate:  104 PR Interval:    QRS Duration: 78 QT Interval:  340 QTC Calculation: 448 R Axis:   124 Text Interpretation: Atrial fibrillation Lateral infarct, age indeterminate similar to previous Confirmed by Theotis Burrow [phone]) on 11/10/2020 12:57:52 PM      COVID/Flu negative. CTA without PE or infiltrate. Favor edema on imaging clinically. Plan for diuresis and admit.   Discussed patient's case with TR[facility] to request admission. Patient and family (if present) updated with plan. Care transferred to [facility] service.  I reviewed all nursing notes, vitals, pertinent old records, EKGs, labs, imaging (as available).    Margette Fast, MD 11/10/20 [phone]

## 2020-11-10 NOTE — ED Provider Notes (Signed)
[facility]ERG[facility] DEPT Provider Note   CSN: [id number] Arrival date & time: 11/10/20  1240     History Chief Complaint  Patient presents with  . Weakness  . Nausea  . Shortness of Breath    [name] is a 87 y.o. female.  87yo F w/ PMH below including sCHF, CAD, COPD, colon cancer s/p resection, A fib on anticoagulation, DVT, TIA, pulm HTN who p/w shortness of breath, weakness, and nausea.  Patient reports that for the past 1 to 2 weeks, she has had progressively worsening shortness of breath that has been much worse over the past few days.  Shortness of breath is especially with exertion and daughter states she could barely make it to the bathroom today.  She reports generalized weakness.  Today every time she stands up she gets nauseated.  She denies any associated chest pain.  No vomiting, diarrhea, urinary symptoms, bloody stools, fevers.  She does have intermittent cough. No recent medication changes.   The history is provided by the patient and a relative.  Weakness Associated symptoms: shortness of breath   Shortness of Breath      Past Medical History:  Diagnosis Date  . Adenocarcinoma, colon [facility]) Oncologist-- Dr Truitt Merle   Multifocal (2) Colon cancer @ ileocecal valve and ascending ( [id number]), Stage IIIB, grade I, MMR normal , 2 of 16 +lymph nodes, negative surgical margins---  06-02-2014  Right hemicolectomy w/ colostomy  . Allergy   . Anxiety   . Asthma    inhaler "sometimes"  . CAD (coronary artery disease)    a. LHC 4/09: pLAD 40, mDx 70-75, pLCx 30, mLCx 50, mRCA 90, EF 60% >> PCI: BMS x2 to RCA;  b. Myoview 8/12: normal  . Cataract    bil cateracts removed  . Chronic anemia   . Chronic diastolic CHF (congestive heart failure) (HCC)    a. Echo 912 - Mild LVH, EF 55-60%, no RWMA, Gr 1 DD, mild MR, mild LAE, mild RAE, PASP 59 mmHg (mod to severe pulmo HTN)  . Clotting disorder (HCC)    hx of dvt, tia on eliquis  . Colostomy in place [facility])     s/p colostomy takedown 5/16  . Colovaginal fistula    s/p colostomy >> colostomy takedown 5/16  . COPD with chronic bronchitis ([place])   . Depression   . Dysrhythmia    A fib  . Essential hypertension   . GERD (gastroesophageal reflux disease)   . History of adenomatous polyp of colon   . History of blood transfusion   . History of DVT of lower extremity   . History of hiatal hernia   . History of TIA (transient ischemic attack)    a. Head CT 6/15: small chronic lacunar infarct in thalamus  . Hyperlipidemia   . OA (osteoarthritis)   . Osteoporosis   . Peripheral neuropathy   . Pneumonia   . Pulmonary HTN ([place])    a. PASP on Echo in 9/12:  59 mmHg  . Renal insufficiency   . Sigmoid diverticulosis    s/p sigmoid colectomy  . Stroke ([place])    TIAs  . Wears dentures   . Wears glasses     Patient Active Problem List   Diagnosis Date Noted  . Mediastinal adenopathy 01/21/2020  . Cellulitis 08/03/2019  . CKD (chronic kidney disease), stage III ([place]) 08/03/2019  . Centrilobular emphysema ([place]) 04/06/2019  . Gastric ulcer   . Thrombocytopenia ([place]) 10/27/2017  .  Abnormal stress test 10/27/2017  . Moderate mitral regurgitation 10/27/2017  . CAP (community acquired pneumonia) 10/26/2017  . Displaced fracture of lateral malleolus of right fibula, subsequent encounter for closed fracture with routine healing 12/27/2016  . Iron deficiency anemia 11/12/2016  . Chronic anticoagulation 11/12/2016  . Chronic atrial fibrillation ([place]) 08/05/2016  . Coronary artery disease of native artery of native heart with stable angina pectoris ([place]) 08/05/2016  . Failed total left knee replacement ([place]) 08/02/2015  . Sigmoid diverticulitis 11/24/2014  . Neuropathic pain 06/13/2014  . Insomnia 06/13/2014  . Anxiety and depression 06/13/2014  . Cancer of ascending colon ([place]) 06/13/2014  . Colovaginal fistula 04/20/2014  . Lethargy 09/15/2013  . Chest pain, musculoskeletal 02/19/2011  .  Depression 02/07/2010  . Chronic diastolic CHF (congestive heart failure) ([place]) 02/27/2009  . Pure hypercholesterolemia 04/05/2008  . Coronary atherosclerosis 04/05/2008  . Osteoarthritis 05/19/2007  . Essential hypertension 01/21/2007  . GERD 01/21/2007  . Hiatal hernia 01/21/2007  . Diverticulosis of large intestine 01/21/2007  . COLONIC POLYPS, HX OF 01/21/2007    Past Surgical History:  Procedure Laterality Date  . ABDOMINAL HYSTERECTOMY    . ANTERIOR CERVICAL DECOMP/DISCECTOMY FUSION  01-31-2009   C5 -- 7  . APPENDECTOMY    . Bone spurs Bilateral    Feet  . CARDIOVASCULAR STRESS TEST  02-26-2011   Normal lexiscan no exercise study/  no ischemia/  normal LV function and wall motion, ef 67%  . CARPAL TUNNEL RELEASE Right   . CATARACT EXTRACTION W/ INTRAOCULAR LENS  IMPLANT, BILATERAL Bilateral   . CHOLECYSTECTOMY    . COLOSTOMY N/A 06/02/2014   Procedure: DIVERTING DESCENDING END COLOSTOMY;  Surgeon: Donnie Mesa, MD;  Location: [facility];  Service: General;  Laterality: N/A;  . CORONARY ANGIOPLASTY WITH STENT PLACEMENT  11-04-2007  dr bensimhon   BMS x2 to RCA/  mild Non-obstructive disease LAD/  normal LVF  . DILATION AND CURETTAGE OF UTERUS    . ESOPHAGOGASTRODUODENOSCOPY N/A 10/28/2017   Procedure: ESOPHAGOGASTRODUODENOSCOPY (EGD);  Surgeon: Yetta Flock, MD;  [facility] ENDOSCOPY;  Service: Gastroenterology;  Laterality: N/A;  . EVALUATION UNDER ANESTHESIA WITH ANAL FISTULECTOMY N/A 10/13/2014   Procedure: ANAL EXAM UNDER ANESTHESIA ;  Surgeon: [name], MD;  Location: [facility];  Service: General;  Laterality: N/A;  . HAND SURGERY     Tendon repair  . LAPAROSCOPIC SIGMOID COLECTOMY N/A 11/24/2014   Procedure: SIGMOID COLECTOMY AND COLSOTOMY CLOSURE;  Surgeon: Donnie Mesa, MD;  Location: [facility];  Service: General;  Laterality: N/A;  . LUMBAR LAMINECTOMY  10-29-2002,  1969   Left  L3 -- 4  decompression  . PARTIAL COLECTOMY N/A 06/02/2014   Procedure: RIGHT  PARTIAL COLECTOMY ;  Surgeon: Donnie Mesa, MD;  Location: [facility];  Service: General;  Laterality: N/A;  . PATELLECTOMY Right 09/14/2013   Procedure: PATELLECTOMY;  Surgeon: Meredith Pel, MD;  Location: [facility];  Service: Orthopedics;  Laterality: Right;  . PROCTOSCOPY N/A 10/13/2014   Procedure: RIDGE PROCTOSCOPY;  Surgeon: [name], MD;  Location: [facility];  Service: General;  Laterality: N/A;  . REVISION TOTAL KNEE ARTHROPLASTY Bilateral right  04-02-2011/  left 1996 & 10-05-1999  . ROTATOR CUFF REPAIR Bilateral   . TONSILLECTOMY    . TOTAL KNEE ARTHROPLASTY Bilateral left 1994/  right 2000  . TOTAL KNEE REVISION Left 08/02/2015   Procedure: LEFT FEMORAL REVISION;  Surgeon: Gaynelle Arabian, MD;  Location: [facility];  Service: Orthopedics;  Laterality: Left;  . TRANSTHORACIC ECHOCARDIOGRAM  12-01-2009   Grade I diastolic dysfunction/  ef 60%/  moderate MR/  mild TR     OB History   No obstetric history on file.     Family History  Problem Relation Age of Onset  . Osteoarthritis Mother   . Heart failure Mother   . Colon cancer Maternal Uncle 73  . Heart Problems Father   . Cancer Sister 39       ? colon cancer   . Kidney cancer Brother 50  . Esophageal cancer Neg Hx   . Rectal cancer Neg Hx   . Stomach cancer Neg Hx   . Pancreatic cancer Neg Hx     Social History   Tobacco Use  . Smoking status: Former Smoker    Packs/day: 0.50    Years: 16.00    Pack years: 8.00    Types: Cigarettes    Start date: 89    Quit date: 07/16/1967    Years since quitting: 53.3  . Smokeless tobacco: Never Used  Vaping Use  . Vaping Use: Never used  Substance Use Topics  . Alcohol use: No    Alcohol/week: 0.0 standard drinks  . Drug use: No    Home Medications Prior to Admission medications   Medication Sig Start Date End Date Taking? Authorizing Provider  acetaminophen (TYLENOL) 500 MG tablet Take 1,000 mg by mouth every 6 (six) hours as needed for moderate pain.     [provider]  albuterol (PROAIR HFA) 108 (90 Base) MCG/ACT inhaler Inhale 2 puffs into the lungs every 6 (six) hours as needed for wheezing or shortness of breath. 04/05/19   [name], MD  allopurinol (ZYLOPRIM) 100 MG tablet TAKE ONE TABLET BY MOUTH ONCE DAILY 07/11/20   Nafziger, [name], NP  apixaban (ELIQUIS) 5 MG TABS tablet Take 1 tablet (5 mg total) by mouth 2 (two) times daily. 08/03/20   Nafziger, [name], NP  Ascorbic Acid (VITAMIN C) 1000 MG tablet Take 1,000 mg by mouth daily.    [provider]  doxycycline (VIBRAMYCIN) 100 MG capsule Take 1 capsule (100 mg total) by mouth 2 (two) times daily. 09/11/20   Burchette, Alinda Sierras, MD  esomeprazole (NEXIUM) 40 MG capsule TAKE ONE CAPSULE BY MOUTH ONCE DAILY 10/10/20   Nafziger, [name], NP  famotidine (PEPCID) 20 MG tablet TAKE ONE TABLET BY MOUTH EVERYDAY AT BEDTIME 04/22/20   Martyn Ehrich, NP  ferrous sulfate 325 (65 FE) MG EC tablet Take 325 mg by mouth daily with breakfast.    [provider]  furosemide (LASIX) 20 MG tablet Take 2 tablets (40 mg total) by mouth daily. 10/09/20   [name], Wonda Cheng, MD  gabapentin (NEURONTIN) 600 MG tablet TAKE ONE TABLET BY MOUTH TWICE DAILY 10/10/20   Nafziger, [name], NP  hydrocortisone (ANUSOL-HC) 2.5 % rectal cream Place 1 application rectally 2 (two) times daily. 07/30/19   [name], MD  LORazepam (ATIVAN) 0.5 MG tablet TAKE ONE TABLET BY MOUTH EVERYDAY AT BEDTIME 11/01/20   Nafziger, [name], NP  losartan (COZAAR) 50 MG tablet Take 1 tablet by mouth once daily 10/17/20   Burchette, Alinda Sierras, MD  metoprolol tartrate (LOPRESSOR) 50 MG tablet TAKE 1 AND 1/2 TABLETS BY MOUTH TWICE DAILY 10/10/20   Nafziger, [name], NP  nitroGLYCERIN (NITROSTAT) 0.4 MG SL tablet Place 1 tablet (0.4 mg total) under the tongue every 5 (five) minutes as needed for chest pain (x 3 pills). Reported on 09/01/2015 04/06/19   [name],  NP  predniSONE (DELTASONE) 20 MG tablet Take 1 tablet (20 mg total) by mouth daily  with breakfast. 09/11/20   Burchette, Alinda Sierras, MD  sertraline (ZOLOFT) 50 MG tablet TAKE ONE TABLET BY MOUTH ONCE DAILY 10/10/20   Nafziger, [name], NP  silver sulfADIAZINE (SILVADENE) 1 % cream SMARTSIG:1 Sparingly Topical Twice Daily 01/31/20   [provider]  Spacer/Aero-Holding Chambers (E-Z SPACER) inhaler Use as instructed 03/20/18   [name], MD  Tiotropium Bromide Monohydrate (SPIRIVA RESPIMAT) 2.5 MCG/ACT AERS Inhale 2 puffs into the lungs daily at 2 PM. 08/24/20 09/23/20  [name], MD  Tiotropium Bromide Monohydrate (SPIRIVA RESPIMAT) 2.5 MCG/ACT AERS Inhale 2 puffs into the lungs daily. 08/24/20   [name], MD  traZODone (DESYREL) 50 MG tablet TAKE ONE TABLET BY MOUTH EVERYDAY AT BEDTIME 10/10/20   Nafziger, [name], NP  triamcinolone cream (KENALOG) 0.1 % Apply 1 application topically 2 (two) times daily.     [provider]    Allergies    Patient has no known allergies.  Review of Systems   Review of Systems  Respiratory: Positive for shortness of breath.   Neurological: Positive for weakness.   All other systems reviewed and are negative except that which was mentioned in HPI  Physical Exam Updated Vital Signs BP (!) 150/86   Pulse 88   Temp 98.4 F (36.9 C) (Oral)   Resp 20   Ht $R'5\' 3"'oI$  (1.6 m)   Wt 79.4 kg   SpO2 99%   BMI 31.00 kg/m   Physical Exam Vitals and nursing note reviewed.  Constitutional:      General: She is not in acute distress.    Appearance: Normal appearance.  HENT:     Head: Normocephalic and atraumatic.  Eyes:     Conjunctiva/sclera: Conjunctivae normal.  Cardiovascular:     Rate and Rhythm: Normal rate and regular rhythm.     Heart sounds: Normal heart sounds. No murmur heard.   Pulmonary:     Effort: Pulmonary effort is normal.     Breath sounds: No wheezing or rales.     Comments: Diminished BS b/l L>R Abdominal:     General: Abdomen is flat. Bowel sounds are normal. There is no distension.      Palpations: Abdomen is soft.     Tenderness: There is no abdominal tenderness.  Musculoskeletal:     Right lower leg: No edema.     Left lower leg: No edema.  Skin:    General: Skin is warm and dry.  Neurological:     Mental Status: She is alert and oriented to person, place, and time.     Comments: fluent  Psychiatric:        Mood and Affect: Mood normal.        Behavior: Behavior normal.     ED Results / Procedures / Treatments   Labs (all labs ordered are listed, but only abnormal results are displayed) Labs Reviewed  COMPREHENSIVE METABOLIC PANEL - Abnormal; Notable for the following components:      Result Value   Glucose, Bld 114 (*)    BUN 30 (*)    GFR, Estimated 56 (*)    All other components within normal limits  BRAIN NATRIURETIC PEPTIDE - Abnormal; Notable for the following components:   B Natriuretic Peptide 490.6 (*)    All other components within normal limits  CBC WITH DIFFERENTIAL/PLATELET - Abnormal; Notable for the following components:   RDW 16.4 (*)  Platelets 136 (*)    Neutro Abs 7.9 (*)    All other components within normal limits  RESP PANEL BY RT-PCR (FLU A&B, COVID) ARPGX2  LIPASE, BLOOD  CBC WITH DIFFERENTIAL/PLATELET  TROPONIN I (HIGH SENSITIVITY)    EKG EKG Interpretation  Date/Time:  Friday November 10 2020 12:53:49 EDT Ventricular Rate:  104 PR Interval:    QRS Duration: 78 QT Interval:  340 QTC Calculation: 448 R Axis:   124 Text Interpretation: Atrial fibrillation Lateral infarct, age indeterminate similar to previous Confirmed by Theotis Burrow [id number]) on 11/10/2020 12:57:52 PM   Radiology DG Chest Port 1 View  Result Date: 11/10/2020 CLINICAL DATA:  Weakness and nausea over the last 2 weeks. EXAM: PORTABLE CHEST 1 VIEW COMPARISON:  08/02/2019 FINDINGS: Heart size upper limits of normal. Atherosclerosis and tortuosity of the aorta. The upper lungs are clear. Question mild atelectasis or infiltrate at the lung bases. Consider  two-view chest radiography if concern persists. No dense consolidation or lobar collapse. No visible effusion. No acute bone finding. Previous distal clavicular resections and ACDF. IMPRESSION: 1. Question mild atelectasis or infiltrate at the lung bases. Consider two-view chest radiography if concern persists. 2. No dense consolidation or lobar collapse. Electronically Signed   By: Nelson Chimes M.D.   On: 11/10/2020 14:46    Procedures Procedures   Medications Ordered in ED Medications - No data to display  ED Course  I have reviewed the triage vital signs and the nursing notes.  Pertinent labs & imaging results that were available during my care of the patient were reviewed by me and considered in my medical decision making (see chart for details).    MDM Rules/Calculators/A&P                          Non-toxic on exam, O2 92-94% on RA with no resp distress.  Lab work shows reassuring CBC, normal creatinine, BNP 490, negative troponin, normal LFTs and lipase.  Chest x-ray with atelectasis versus infiltrate in lung bases.  Because of her worsening dyspnea and sats around 90% on room air, have placed on small amount of supplemental oxygen.  We will obtain CTA of chest to evaluate for PE, edema, or occult infiltrate.  Patient signed out to oncoming provider pending CTA results as well as COVID screening test. Final Clinical Impression(s) / ED Diagnoses Final diagnoses:  SOB (shortness of breath)    Rx / DC Orders ED Discharge Orders    None       Arhan Mcmanamon, Wenda Overland, MD 11/10/20 1516

## 2020-11-10 NOTE — ED Notes (Signed)
Attempted to obtain IV access in patient's L AC and R forearm without success. Bruising noted, but no other complications. Patient on blood thinners at home. RN at bedside during this time.

## 2020-11-10 NOTE — Telephone Encounter (Signed)
Spoke with patient daughter agreed to go to ED.

## 2020-11-11 ENCOUNTER — Other Ambulatory Visit (HOSPITAL_COMMUNITY): Payer: HMO

## 2020-11-11 ENCOUNTER — Inpatient Hospital Stay (HOSPITAL_COMMUNITY): Payer: HMO

## 2020-11-11 DIAGNOSIS — R531 Weakness: Secondary | ICD-10-CM | POA: Diagnosis present

## 2020-11-11 DIAGNOSIS — Z7901 Long term (current) use of anticoagulants: Secondary | ICD-10-CM | POA: Diagnosis not present

## 2020-11-11 DIAGNOSIS — I130 Hypertensive heart and chronic kidney disease with heart failure and stage 1 through stage 4 chronic kidney disease, or unspecified chronic kidney disease: Secondary | ICD-10-CM | POA: Diagnosis present

## 2020-11-11 DIAGNOSIS — J9859 Other diseases of mediastinum, not elsewhere classified: Secondary | ICD-10-CM | POA: Diagnosis present

## 2020-11-11 DIAGNOSIS — Z800 Family history of malignant neoplasm of digestive organs: Secondary | ICD-10-CM | POA: Diagnosis not present

## 2020-11-11 DIAGNOSIS — I5033 Acute on chronic diastolic (congestive) heart failure: Secondary | ICD-10-CM

## 2020-11-11 DIAGNOSIS — Z85038 Personal history of other malignant neoplasm of large intestine: Secondary | ICD-10-CM | POA: Diagnosis not present

## 2020-11-11 DIAGNOSIS — D509 Iron deficiency anemia, unspecified: Secondary | ICD-10-CM | POA: Diagnosis present

## 2020-11-11 DIAGNOSIS — Z96653 Presence of artificial knee joint, bilateral: Secondary | ICD-10-CM | POA: Diagnosis present

## 2020-11-11 DIAGNOSIS — Z9049 Acquired absence of other specified parts of digestive tract: Secondary | ICD-10-CM | POA: Diagnosis not present

## 2020-11-11 DIAGNOSIS — K219 Gastro-esophageal reflux disease without esophagitis: Secondary | ICD-10-CM | POA: Diagnosis present

## 2020-11-11 DIAGNOSIS — Z8673 Personal history of transient ischemic attack (TIA), and cerebral infarction without residual deficits: Secondary | ICD-10-CM | POA: Diagnosis not present

## 2020-11-11 DIAGNOSIS — I509 Heart failure, unspecified: Secondary | ICD-10-CM | POA: Diagnosis not present

## 2020-11-11 DIAGNOSIS — I10 Essential (primary) hypertension: Secondary | ICD-10-CM | POA: Diagnosis not present

## 2020-11-11 DIAGNOSIS — Z87891 Personal history of nicotine dependence: Secondary | ICD-10-CM | POA: Diagnosis not present

## 2020-11-11 DIAGNOSIS — Z7952 Long term (current) use of systemic steroids: Secondary | ICD-10-CM | POA: Diagnosis not present

## 2020-11-11 DIAGNOSIS — F419 Anxiety disorder, unspecified: Secondary | ICD-10-CM | POA: Diagnosis present

## 2020-11-11 DIAGNOSIS — I4821 Permanent atrial fibrillation: Secondary | ICD-10-CM | POA: Diagnosis present

## 2020-11-11 DIAGNOSIS — Z20822 Contact with and (suspected) exposure to covid-19: Secondary | ICD-10-CM | POA: Diagnosis present

## 2020-11-11 DIAGNOSIS — J449 Chronic obstructive pulmonary disease, unspecified: Secondary | ICD-10-CM | POA: Diagnosis present

## 2020-11-11 DIAGNOSIS — F32A Depression, unspecified: Secondary | ICD-10-CM | POA: Diagnosis present

## 2020-11-11 DIAGNOSIS — R040 Epistaxis: Secondary | ICD-10-CM | POA: Diagnosis present

## 2020-11-11 DIAGNOSIS — R52 Pain, unspecified: Secondary | ICD-10-CM | POA: Diagnosis not present

## 2020-11-11 DIAGNOSIS — Z8051 Family history of malignant neoplasm of kidney: Secondary | ICD-10-CM | POA: Diagnosis not present

## 2020-11-11 DIAGNOSIS — N1832 Chronic kidney disease, stage 3b: Secondary | ICD-10-CM | POA: Diagnosis present

## 2020-11-11 DIAGNOSIS — Z8249 Family history of ischemic heart disease and other diseases of the circulatory system: Secondary | ICD-10-CM | POA: Diagnosis not present

## 2020-11-11 DIAGNOSIS — I2510 Atherosclerotic heart disease of native coronary artery without angina pectoris: Secondary | ICD-10-CM | POA: Diagnosis present

## 2020-11-11 LAB — BASIC METABOLIC PANEL
Anion gap: 13 (ref 5–15)
BUN: 25 mg/dL — ABNORMAL HIGH (ref 8–23)
CO2: 26 mmol/L (ref 22–32)
Calcium: 8.8 mg/dL — ABNORMAL LOW (ref 8.9–10.3)
Chloride: 99 mmol/L (ref 98–111)
Creatinine, Ser: 1.06 mg/dL — ABNORMAL HIGH (ref 0.44–1.00)
GFR, Estimated: 51 mL/min — ABNORMAL LOW (ref >60)
Glucose, Bld: 102 mg/dL — ABNORMAL HIGH (ref 70–99)
Potassium: 3.6 mmol/L (ref 3.5–5.1)
Sodium: 138 mmol/L (ref 135–145)

## 2020-11-11 LAB — ECHOCARDIOGRAM COMPLETE
AR max vel: 2.05 cm2
AV Area VTI: 1.98 cm2
AV Area mean vel: 1.93 cm2
AV Mean grad: 6 mmHg
AV Peak grad: 11 mmHg
Ao pk vel: 1.66 m/s
Height: 63 in
S' Lateral: 2.6 cm
Weight: 2928 oz

## 2020-11-11 MED ORDER — DICLOFENAC SODIUM 1 % EX GEL
2.0000 g | Freq: Four times a day (QID) | CUTANEOUS | Status: DC
  Administered 2020-11-11 – 2020-11-13 (×8): 2 g via TOPICAL
  Filled 2020-11-11: qty 100

## 2020-11-11 MED ORDER — SODIUM CHLORIDE 0.9 % IV SOLN
12.5000 mg | Freq: Four times a day (QID) | INTRAVENOUS | Status: DC | PRN
  Filled 2020-11-11: qty 0.5

## 2020-11-11 MED ORDER — UMECLIDINIUM BROMIDE 62.5 MCG/INH IN AEPB
1.0000 | INHALATION_SPRAY | Freq: Every day | RESPIRATORY_TRACT | Status: DC
  Administered 2020-11-11 – 2020-11-13 (×3): 1 via RESPIRATORY_TRACT
  Filled 2020-11-11: qty 7

## 2020-11-11 MED ORDER — TIOTROPIUM BROMIDE MONOHYDRATE 2.5 MCG/ACT IN AERS: 2.0000 | INHALATION_SPRAY | Freq: Every day | RESPIRATORY_TRACT | Status: DC

## 2020-11-11 MED ORDER — METOPROLOL TARTRATE 12.5 MG HALF TABLET
12.5000 mg | ORAL_TABLET | Freq: Two times a day (BID) | ORAL | Status: DC
  Administered 2020-11-11 – 2020-11-13 (×5): 12.5 mg via ORAL
  Filled 2020-11-11 (×5): qty 1

## 2020-11-11 MED ORDER — TRAMADOL HCL 50 MG PO TABS
50.0000 mg | ORAL_TABLET | Freq: Four times a day (QID) | ORAL | Status: DC | PRN
  Administered 2020-11-11 – 2020-11-12 (×2): 50 mg via ORAL
  Filled 2020-11-11 (×2): qty 1

## 2020-11-11 MED ORDER — ALBUTEROL SULFATE HFA 108 (90 BASE) MCG/ACT IN AERS: 2.0000 | INHALATION_SPRAY | Freq: Four times a day (QID) | RESPIRATORY_TRACT | Status: DC | PRN

## 2020-11-11 MED ORDER — HYDROCODONE-ACETAMINOPHEN 5-325 MG PO TABS
1.0000 | ORAL_TABLET | Freq: Four times a day (QID) | ORAL | Status: DC | PRN
  Administered 2020-11-11 – 2020-11-13 (×5): 1 via ORAL
  Filled 2020-11-11 (×5): qty 1

## 2020-11-11 MED ORDER — PREDNISONE 20 MG PO TABS
20.0000 mg | ORAL_TABLET | Freq: Every day | ORAL | Status: DC
  Administered 2020-11-11 – 2020-11-13 (×3): 20 mg via ORAL
  Filled 2020-11-11 (×3): qty 1

## 2020-11-11 MED ORDER — PANTOPRAZOLE SODIUM 40 MG PO TBEC
40.0000 mg | DELAYED_RELEASE_TABLET | Freq: Every day | ORAL | Status: DC
  Administered 2020-11-11 – 2020-11-13 (×3): 40 mg via ORAL
  Filled 2020-11-11 (×3): qty 1

## 2020-11-11 NOTE — Progress Notes (Signed)
  Echocardiogram 2D Echocardiogram has been performed.  [name] F 11/11/2020, 4:11 PM

## 2020-11-11 NOTE — Evaluation (Signed)
Physical Therapy Evaluation Patient Details Name: [name] MRN: [id number] DOB: [birth year] Today's Date: 11/11/2020   History of Present Illness  The pt is an 87 yo female presenting 4/29 with c/o x2 weeks nausea and SOB with activity. CT angio of chest negatie, concern for CHF exacerbation. PMH includes: CHF, CAD, COPD, colon cancer s/p resection, afib, DVT, TIA, and pulm HTN.    Clinical Impression  Pt in bed upon arrival of PT, agreeable to evaluation at this time. Prior to admission the pt was completely independent with all mobility, ADLs, and IADLs, with use of RW, living alone in a home with a ramped entry. The pt now presents with minor limitations in functional mobility, strength, power, and endurance due to above dx as well as chronic debility, and will continue to benefit from skilled PT to address these deficits. The pt was able to complete bed mobility with minA and use of HOB elevated, but reports sleeping in a lift chair at home. The pt was then able to stand without assist from multiple surfaces, and demos good awareness of improved mobility from elevated surfaces and surfaces with armrests for improved independence and safety. Pt able to complete hallway ambulation with use of rollator (baseline) on RA with SpO2 92%-96%. Pt with increased work of breathing and needing multiple standing rest breaks, but is able to manage without cues, most limited at this time by L ankle pain. Will continue to benefit from skilled PT acutely and following d/c to maximize return to independence and improved endurance for mobility at home.      Follow Up Recommendations Home health PT;Supervision - Intermittent    Equipment Recommendations   (pt has needed equipment)    Recommendations for Other Services       Precautions / Restrictions Precautions Precautions: Fall Precaution Comments: watch SpO2 Restrictions Weight Bearing Restrictions: No      Mobility  Bed Mobility Overal bed  mobility: Needs Assistance Bed Mobility: Supine to Sit     Supine to sit: Min guard     General bed mobility comments: minG with increased time, use of bed rails, HOB elevated.no physical assist    Transfers Overall transfer level: Needs assistance Equipment used: 4-wheeled walker Transfers: Sit to/from [facility] Sit to Stand: Min assist;Min guard Stand pivot transfers: Min guard       General transfer comment: pt requiring minA to power up from low surface without armrests, but was then able to stand from [facility] or reliner without assist and with good stability  Ambulation/Gait Ambulation/Gait assistance: Min guard Gait Distance (Feet): 60 Feet Assistive device: 4-wheeled walker Gait Pattern/deviations: Step-to pattern;Decreased stance time - left;Decreased stride length;Decreased weight shift to left;Antalgic;Trunk flexed;Narrow base of support Gait velocity: decreased   General Gait Details: slow, antalgic gait due to pain in LLE no overt LOB. pt able to manage with SpO2 steady on RA. significant trunk flexion, despite cues for posture      Balance Overall balance assessment: Mild deficits observed, not formally tested                                           Pertinent Vitals/Pain Pain Assessment: Faces Faces Pain Scale: Hurts little more Pain Location: L ankle Pain Descriptors / Indicators: Discomfort;Grimacing Pain Intervention(s): Limited activity within patient's tolerance;Repositioned;Monitored during session;Premedicated before session    Home Living Family/patient expects to be  discharged to:: Private residence Living Arrangements: Alone Available Help at Discharge: Family;Available PRN/intermittently (daughter works from home, can visit if needed) Type of Home: House Home Access: Ramped entrance     [place]: Two level;Able to live on main level with bedroom/bathroom Home Equipment: Gilford Rile - 2 wheels;Walker - 4  wheels;Bedside commode;Shower seat - built in;Grab bars - tub/shower;Hand held shower head Additional Comments: pt reports using rollator    Prior Function Level of Independence: Independent with assistive device(s)         Comments: pt reports full independence with all ADLs, IADLs, taking care of her dog Bandit, no falls in last 6 months, using rollator     Hand Dominance   Dominant Hand: Right    Extremity/Trunk Assessment   Upper Extremity Assessment Upper Extremity Assessment: Overall WFL for tasks assessed    Lower Extremity Assessment Lower Extremity Assessment: Overall WFL for tasks assessed;LLE deficits/detail (limited endurance) LLE Deficits / Details: limited by pain. unable to fully bear weight, but full ROM LLE: Unable to fully assess due to pain LLE Sensation: WNL    Cervical / Trunk Assessment Cervical / Trunk Assessment: Kyphotic  Communication   Communication: No difficulties  Cognition Arousal/Alertness: Awake/alert Behavior During Therapy: WFL for tasks assessed/performed Overall Cognitive Status: Within Functional Limits for tasks assessed                                 General Comments: pt able to recall all home set up and prior function, alert and answering questions appropriately      General Comments General comments (skin integrity, edema, etc.): SpO2 92-96% on RA with gait    Exercises     Assessment/Plan    PT Assessment Patient needs continued PT services  PT Problem List Decreased strength;Decreased range of motion;Decreased activity tolerance;Decreased balance;Decreased mobility;Cardiopulmonary status limiting activity       PT Treatment Interventions DME instruction;Gait training;Functional mobility training;Therapeutic activities;Therapeutic exercise;Balance training;Patient/family education    PT Goals (Current goals can be found in the Care Plan section)  Acute Rehab PT Goals Patient Stated Goal: return home,  to be able to walk to mailbox and back PT Goal Formulation: With patient Time For Goal Achievement: 11/25/20 Potential to Achieve Goals: Good    Frequency Min 3X/week    AM-PAC PT "6 Clicks" Mobility  Outcome Measure Help needed turning from your back to your side while in a flat bed without using bedrails?: A Little Help needed moving from lying on your back to sitting on the side of a flat bed without using bedrails?: A Little Help needed moving to and from a bed to a chair (including a wheelchair)?: A Little Help needed standing up from a chair using your arms (e.g., wheelchair or bedside chair)?: A Little Help needed to walk in hospital room?: A Little Help needed climbing 3-5 steps with a railing? : A Little 6 Click Score: 18    End of Session Equipment Utilized During Treatment: Gait belt Activity Tolerance: Patient tolerated treatment well Patient left: in chair;with call bell/phone within reach;with chair alarm set;with nursing/sitter in room Nurse Communication: Mobility status PT Visit Diagnosis: Other abnormalities of gait and mobility (R26.89);Pain Pain - Right/Left: Left Pain - part of body: Ankle and joints of foot    Time: 8469-6295 PT Time Calculation (min) (ACUTE ONLY): 43 min   Charges:   PT Evaluation $PT Eval Low Complexity: 1 Low PT Treatments $  Gait Training: 8-22 mins        Karma Ganja, PT, DPT   Acute Rehabilitation Department Pager #: [phone]  Otho Bellows 11/11/2020, 10:02 AM

## 2020-11-11 NOTE — Evaluation (Signed)
Occupational Therapy Evaluation Patient Details Name: [name] MRN: [id number] DOB: January 21, 1930 Today's Date: 11/11/2020    History of Present Illness The pt is an 87 yo female presenting 4/29 with c/o x2 weeks nausea and SOB with activity. CT angio of chest negatie, concern for CHF exacerbation. PMH includes: CHF, CAD, COPD, colon cancer s/p resection, afib, DVT, TIA, and pulm HTN.   Clinical Impression   Pt typically independent in ADL/IADL and mobility with Rollator in home environment where she lives alone with her dog Bandit. Her daughter is able to come check on her. Today Pt requires increased time but is overall min guard with noticeable SOB with ADL activity Initiated energy conservation verbally but will benefit from [facility] handout. Pt will benefit from skilled OT acutely but not recommending post-acute OT services at this time. Pt on RA throughout session with SpO2>92% throughout session. Next session to bring [facility] handout and review in full in addition to OOB ADL activity.     Follow Up Recommendations  No OT follow up;Supervision - Intermittent    Equipment Recommendations  None recommended by OT (Pt has appropriate DME)    Recommendations for Other Services       Precautions / Restrictions Precautions Precautions: Fall Precaution Comments: watch SpO2 Restrictions Weight Bearing Restrictions: No      Mobility Bed Mobility Overal bed mobility: Needs Assistance Bed Mobility: Sit to Supine       Sit to supine: Min assist   General bed mobility comments: min A for BLE back into bed    Transfers Overall transfer level: Needs assistance Equipment used: 4-wheeled walker Transfers: Sit to/from [facility] Sit to Stand: Min guard Stand pivot transfers: Min guard       General transfer comment: min guard for safety, good hand placement    Balance Overall balance assessment: Mild deficits observed, not formally tested                                          ADL either performed or assessed with clinical judgement   ADL Overall ADL's : Needs assistance/impaired Eating/Feeding: Modified independent;Sitting   Grooming: Wash/dry hands;Wash/dry face;Oral care;Min guard;Standing Grooming Details (indicate cue type and reason): sink level, gets SOB with continuous activity Upper Body Bathing: Min guard;Sitting   Lower Body Bathing: Min guard;Sitting/lateral leans   Upper Body Dressing : Set up;Sitting   Lower Body Dressing: Min guard;Sit to/from stand Lower Body Dressing Details (indicate cue type and reason): able to don compression socks, socks earlier with PT (Pt reported and PT confirmed) Toilet Transfer: Min guard;Ambulation (Rollator)   Toileting- Water quality scientist and Hygiene: Min guard;Sitting/lateral lean       Functional mobility during ADLs: Min guard (Rollator) General ADL Comments: SOB with activity, needs further energy conservation education     Vision Baseline Vision/History: Wears glasses Wears Glasses: At all times Patient Visual Report: No change from baseline       Perception     Praxis      Pertinent Vitals/Pain Pain Assessment: Faces Faces Pain Scale: Hurts little more Pain Location: L ankle Pain Descriptors / Indicators: Discomfort;Grimacing Pain Intervention(s): Limited activity within patient's tolerance;Monitored during session;Repositioned     Hand Dominance Right   Extremity/Trunk Assessment Upper Extremity Assessment Upper Extremity Assessment: Overall WFL for tasks assessed   Lower Extremity Assessment Lower Extremity Assessment: Defer to PT evaluation  Cervical / Trunk Assessment Cervical / Trunk Assessment: Kyphotic   Communication Communication Communication: No difficulties   Cognition Arousal/Alertness: Awake/alert Behavior During Therapy: WFL for tasks assessed/performed Overall Cognitive Status: Within Functional Limits for tasks assessed                                      General Comments  on RA throughout session with SpO2 >92% when spot checked    Exercises     Shoulder Instructions      Home Living Family/patient expects to be discharged to:: Private residence Living Arrangements: Alone (small dog "Bandit") Available Help at Discharge: Family;Available PRN/intermittently (daughter works from home, can visit if needed) Type of Home: House Home Access: Ramped entrance     [place]: Two level;Able to live on main level with bedroom/bathroom Alternate Level Stairs-Number of Steps: pt does not go downstairs to basement   [employer] Shower/Tub: Occupational psychologist: Standard Bathroom Accessibility: Yes How Accessible: Accessible via wheelchair Home Equipment: [place] - 2 wheels;Walker - 4 wheels;Bedside commode;Shower seat - built in;Grab bars - tub/shower;Hand held shower head   Additional Comments: pt reports using rollator      Prior Functioning/Environment Level of Independence: Independent with assistive device(s)        Comments: pt reports full independence with all ADLs, IADLs, taking care of her dog Bandit, no falls in last 6 months, using rollator        OT Problem List: Decreased strength;Decreased activity tolerance;Impaired balance (sitting and/or standing);Cardiopulmonary status limiting activity;Increased edema      OT Treatment/Interventions: Self-care/ADL training;Therapeutic exercise;Energy conservation;DME and/or AE instruction;Therapeutic activities;Patient/family education;Balance training    OT Goals(Current goals can be found in the care plan section) Acute Rehab OT Goals Patient Stated Goal: return home to [facility], to be able to walk to mailbox and back OT Goal Formulation: With patient Time For Goal Achievement: 11/25/20 Potential to Achieve Goals: Good ADL Goals Pt Will Perform Grooming: with modified independence;standing Pt Will Perform Upper Body  Dressing: with modified independence;sitting Pt Will Perform Lower Body Dressing: with modified independence;sit to/from stand Pt Will Transfer to Toilet: with modified independence;ambulating Pt Will Perform Toileting - Clothing Manipulation and hygiene: with modified independence;sit to/from stand Additional ADL Goal #1: Pt will verbalize 3 ways of conserving energy during ADL routine with zero cues  OT Frequency: Min 2X/week   Barriers to D/C:            Co-evaluation              AM-PAC OT "6 Clicks" Daily Activity     Outcome Measure Help from another person eating meals?: None Help from another person taking care of personal grooming?: A Little Help from another person toileting, which includes using toliet, bedpan, or urinal?: A Little Help from another person bathing (including washing, rinsing, drying)?: A Little Help from another person to put on and taking off regular upper body clothing?: A Little Help from another person to put on and taking off regular lower body clothing?: A Little 6 Click Score: 19   End of Session Equipment Utilized During Treatment: Gait belt Agricultural consultant) Nurse Communication: Mobility status  Activity Tolerance: Patient tolerated treatment well Patient left: in bed;with call bell/phone within reach;with bed alarm set  OT Visit Diagnosis: Muscle weakness (generalized) (M62.81);Other abnormalities of gait and mobility (R26.89)  Time: 5462-7035 OT Time Calculation (min): 18 min Charges:  OT General Charges $OT Visit: 1 Visit OT Evaluation $OT Eval Moderate Complexity: [place] OTR/L Acute Rehabilitation Services Pager: [phone] Office: [place] 11/11/2020, 12:05 PM

## 2020-11-11 NOTE — Plan of Care (Signed)
  Problem: Education: Goal: Knowledge of General Education information will improve Description: Including pain rating scale, medication(s)/side effects and non-pharmacologic comfort measures Outcome: Progressing   Problem: Clinical Measurements: Goal: Ability to maintain clinical measurements within normal limits will improve Outcome: Progressing   Problem: Activity: Goal: Risk for activity intolerance will decrease Outcome: Progressing   Problem: Elimination: Goal: Will not experience complications related to bowel motility Outcome: Progressing

## 2020-11-11 NOTE — Care Management Obs Status (Signed)
[facility] NOTIFICATION   Patient Details  Name: Taraneh Metheney Vanvleck MRN: [id number] Date of Birth: March 16, 1930   Medicare Observation Status Notification Given:  Yes    Norina Buzzard, RN 11/11/2020, 10:52 AM

## 2020-11-11 NOTE — Progress Notes (Addendum)
PROGRESS NOTE    [name]  JWJ:[id number] DOB: Aug 08, 1929 DOA: 11/10/2020 PCP: [name], NP   Brief Narrative:  HPI on 11/10/2020 by Dr. Neldon Mc Labat is a 87 y.o. female with medical history significant for centrilobular emphysema follows with pulmonary, former smoker, permanent atrial fibrillation, colon cancer s/p resection, GERD, essential hypertension, who initially presented to [place] ED due to gradually worsening shortness of breath of 3 days duration associated with productive sputum with gray color.  Reports temperature of 99 at home.  Also reports intermittent headache.  No chest pain.  Has felt weaker.  Uses a walker.  CTA PE was done in the ED, no evidence of acute PE, showed new enlarged 1.5 cm mediastinal node adjacent to the right bronchus intermedius.  Patient was transferred to [facility].  TRH was asked to admit.  Interim history  Presented with shortness of breath, found to have CHF exacerbation, currently on IV Lasix. Assessment & Plan   Acute on chronic diastolic heart failure exacerbation with dyspnea on exertion -Echocardiogram 10/28/2017 showed EF 55 to 60% -Patient presented with shortness of breath, BNP 490 -Chest x-ray showed pulmonary edema -CTA chest unremarkable for PE -Pending repeat echocardiogram -Monitor intake output, daily  -Continue IV Lasix  Permanent atrial fibrillation -Continue metoprolol for rate control -Continue Eliquis  Iron deficiency anemia -Continue iron supplementation  Chronic anxiety/depression -Continue Zoloft  GERD -Continue PPI  COPD -Patient on chronic steroids, prednisone  -Continue inhalers as needed -Currently stable, no wheezing noted on examination  Mediastinal node -CTA chest showed new enlargement 0.5 cm mediastinal node adjacent to the right bronchus intermedius -Patient need follow-up with pulmonology as outpatient  Generalized weakness -Patient uses walker -PT recommended home health -OT  eval pending  Epistaxis -Suspect secondary to supplemental oxygen -Continue to monitor closely  Pain -Patient states she fell on her ankle approximately 3 years ago -She normally takes Tylenol at home however this is not working -Will order tramadol and hydrocodone, Voltaren gel for pain control  DVT Prophylaxis  Eliquis  Code Status: Full  Family Communication: None at bedside  Disposition Plan:  Status is: Observation  The patient will require care spanning > 2 midnights and should be moved to inpatient because: IV treatments appropriate due to intensity of illness or inability to take PO  Dispo: The patient is from: Home              Anticipated d/c is to: Home              Patient currently is not medically stable to d/c.   Difficult to place patient No   Consultants None  Procedures  None  Antibiotics   Anti-infectives (From admission, onward)   None      Subjective:   [name] seen and examined today.  Continues to feel short of breath but feels it is improved mildly since admission.  She states she is able to sit up or lay down comfortably.  Denies current chest pain, abdominal pain, nausea or vomiting, diarrhea or constipation, dizziness or headache.   Complaining of a nose bleed today.  Objective:   Vitals:   11/10/20 2344 11/11/20 0326 11/11/20 0918 11/11/20 1006  BP: 120/76 139/79 120/69   Pulse: 83 77 86   Resp: 18 18 18    Temp: 98.4 F (36.9 C) 98.6 F (37 C) 98.7 F (37.1 C)   TempSrc: Oral Oral Oral   SpO2: 95% 98% 96% 95%  Weight:  83  kg    Height:        Intake/Output Summary (Last 24 hours) at 11/11/2020 1126 Last data filed at 11/11/2020 3875 Gross per 24 hour  Intake 600 ml  Output 1000 ml  Net -400 ml   Filed Weights   11/10/20 1301 11/10/20 1934 11/11/20 0326  Weight: 79.4 kg 83 kg 83 kg    Exam  General: Well developed, elderly, chronically ill-appearing, NAD  HEENT: NCAT, mucous membranes moist.   Cardiovascular: S1  S2 auscultated, irregular  Respiratory: Diminished breath sounds, no wheezing  Abdomen: Soft, nontender, nondistended, + bowel sounds  Extremities: warm dry without cyanosis clubbing.   Neuro: AAOx3, nonfocal  Psych: appropriate mood and affect   Data Reviewed: I have personally reviewed following labs and imaging studies  CBC: Recent Labs  Lab 11/10/20 1400  WBC 9.9  NEUTROABS 7.9*  HGB 12.5  HCT 40.1  MCV 95.7  PLT 643*   Basic Metabolic Panel: Recent Labs  Lab 11/10/20 1311 11/11/20 0315  NA 142 138  K 4.0 3.6  CL 104 99  CO2 27 26  GLUCOSE 114* 102*  BUN 30* 25*  CREATININE 0.98 1.06*  CALCIUM 9.6 8.8*   GFR: Estimated Creatinine Clearance: 37.4 mL/min (A) (by C-G formula based on SCr of 1.06 mg/dL (H)). Liver Function Tests: Recent Labs  Lab 11/10/20 1311  AST 32  ALT 17  ALKPHOS 58  BILITOT 1.0  PROT 7.1  ALBUMIN 4.1   Recent Labs  Lab 11/10/20 1311  LIPASE 31   No results for input(s): AMMONIA in the last 168 hours. Coagulation Profile: No results for input(s): INR, PROTIME in the last 168 hours. Cardiac Enzymes: No results for input(s): CKTOTAL, CKMB, CKMBINDEX, TROPONINI in the last 168 hours. BNP (last 3 results) No results for input(s): PROBNP in the last 8760 hours. HbA1C: No results for input(s): HGBA1C in the last 72 hours. CBG: No results for input(s): GLUCAP in the last 168 hours. Lipid Profile: No results for input(s): CHOL, HDL, LDLCALC, TRIG, CHOLHDL, LDLDIRECT in the last 72 hours. Thyroid Function Tests: No results for input(s): TSH, T4TOTAL, FREET4, T3FREE, THYROIDAB in the last 72 hours. Anemia Panel: No results for input(s): VITAMINB12, FOLATE, FERRITIN, TIBC, IRON, RETICCTPCT in the last 72 hours. Urine analysis:    Component Value Date/Time   COLORURINE YELLOW 10/05/2019 1012   APPEARANCEUR CLEAR 10/05/2019 1012   LABSPEC 1.020 10/05/2019 1012   PHURINE 5.5 10/05/2019 1012   GLUCOSEU NEGATIVE 10/05/2019 1012    HGBUR NEGATIVE 10/05/2019 1012   HGBUR trace-lysed 11/08/2009 1028   BILIRUBINUR NEGATIVE 10/05/2019 1012   BILIRUBINUR n 08/25/2014 1132   KETONESUR NEGATIVE 10/05/2019 1012   PROTEINUR NEGATIVE 08/03/2019 0813   UROBILINOGEN 0.2 10/05/2019 1012   NITRITE NEGATIVE 10/05/2019 1012   LEUKOCYTESUR NEGATIVE 10/05/2019 1012   Sepsis Labs: @LABRCNTIP (procalcitonin:4,lacticidven:4)  ) Recent Results (from the past 240 hour(s))  Resp Panel by RT-PCR (Flu A&B, Covid) Nasopharyngeal Swab     Status: None   Collection Time: 11/10/20  3:05 PM   Specimen: Nasopharyngeal Swab; Nasopharyngeal(NP) swabs in vial transport medium  Result Value Ref Range Status   SARS Coronavirus 2 by RT PCR NEGATIVE NEGATIVE Final    Comment: (NOTE) SARS-CoV-2 target nucleic acids are NOT DETECTED.  The SARS-CoV-2 RNA is generally detectable in upper respiratory specimens during the acute phase of infection. The lowest concentration of SARS-CoV-2 viral copies this assay can detect is 138 copies/mL. A negative result does not preclude SARS-Cov-2 infection and  should not be used as the sole basis for treatment or other patient management decisions. A negative result Ricard occur with  improper specimen collection/handling, submission of specimen other than nasopharyngeal swab, presence of viral mutation(s) within the areas targeted by this assay, and inadequate number of viral copies(<138 copies/mL). A negative result must be combined with clinical observations, patient history, and epidemiological information. The expected result is Negative.  Fact Sheet for Patients:  EntrepreneurPulse.com.au  Fact Sheet for Healthcare Providers:  IncredibleEmployment.be  This test is no t yet approved or cleared by the [place] FDA and  has been authorized for detection and/or diagnosis of SARS-CoV-2 by FDA under an Emergency Use Authorization (EUA). This EUA will remain  in effect  (meaning this test can be used) for the duration of the COVID-19 declaration under Section 564(b)(1) of the Act, 21 U.S.C.section 360bbb-3(b)(1), unless the authorization is terminated  or revoked sooner.       Influenza A by PCR NEGATIVE NEGATIVE Final   Influenza B by PCR NEGATIVE NEGATIVE Final    Comment: (NOTE) The Xpert Xpress SARS-CoV-2/FLU/RSV plus assay is intended as an aid in the diagnosis of influenza from Nasopharyngeal swab specimens and should not be used as a sole basis for treatment. Nasal washings and aspirates are unacceptable for Xpert Xpress SARS-CoV-2/FLU/RSV testing.  Fact Sheet for Patients: EntrepreneurPulse.com.au  Fact Sheet for Healthcare Providers: IncredibleEmployment.be  This test is not yet approved or cleared by the [place] FDA and has been authorized for detection and/or diagnosis of SARS-CoV-2 by FDA under an Emergency Use Authorization (EUA). This EUA will remain in effect (meaning this test can be used) for the duration of the COVID-19 declaration under Section 564(b)(1) of the Act, 21 U.S.C. section 360bbb-3(b)(1), unless the authorization is terminated or revoked.  Performed at [facility], [address], [place], [place] [address]       Radiology Studies: CT Angio Chest PE W/Cm &/Or Wo Cm  Result Date: 11/10/2020 CLINICAL DATA:  Progressive shortness of breath EXAM: CT ANGIOGRAPHY CHEST WITH CONTRAST TECHNIQUE: Multidetector CT imaging of the chest was performed using the standard protocol during bolus administration of intravenous contrast. Multiplanar CT image reconstructions and MIPs were obtained to evaluate the vascular anatomy. CONTRAST:  187mL OMNIPAQUE IOHEXOL 350 MG/ML SOLN COMPARISON:  03/23/2020 FINDINGS: Cardiovascular: Satisfactory opacification of the pulmonary arteries to the segmental level. No evidence of pulmonary embolism. Cardiomegaly with biatrial  enlargement. Coronary artery calcification. No pericardial effusion. Thoracic aorta is normal in caliber with mild calcified plaque. Mediastinum/Nodes: Enlarged node adjacent to the right bronchus intermedius measuring 1.5 cm short axis (series 4, image 82) additional nonenlarged mediastinal and hilar nodes. Hiatal hernia. Lungs/Pleura: No pleural effusion or pneumothorax. Mosaic attenuation. Upper Abdomen: No acute abnormality. Musculoskeletal: Lower cervical ACDF. Degenerative changes of the thoracic spine. Review of the MIP images confirms the above findings. IMPRESSION: No evidence of acute pulmonary embolism. Mosaic attenuation likely reflect obstructing small airways disease or edema. Cardiomegaly.  Coronary and aortic atherosclerosis. New enlarged 1.5 cm mediastinal node adjacent to the right bronchus intermedius. Electronically Signed   By: Macy Mis M.D.   On: 11/10/2020 16:14   DG Chest Port 1 View  Result Date: 11/10/2020 CLINICAL DATA:  Weakness and nausea over the last 2 weeks. EXAM: PORTABLE CHEST 1 VIEW COMPARISON:  08/02/2019 FINDINGS: Heart size upper limits of normal. Atherosclerosis and tortuosity of the aorta. The upper lungs are clear. Question mild atelectasis or infiltrate at the lung bases. Consider two-view  chest radiography if concern persists. No dense consolidation or lobar collapse. No visible effusion. No acute bone finding. Previous distal clavicular resections and ACDF. IMPRESSION: 1. Question mild atelectasis or infiltrate at the lung bases. Consider two-view chest radiography if concern persists. 2. No dense consolidation or lobar collapse. Electronically Signed   By: Nelson Chimes M.D.   On: 11/10/2020 14:46     Scheduled Meds: . allopurinol  100 mg Oral Daily  . apixaban  5 mg Oral BID  . diclofenac Sodium  2 g Topical QID  . ferrous sulfate  325 mg Oral Q breakfast  . furosemide  20 mg Intravenous BID  . metoprolol tartrate  12.5 mg Oral BID  . pantoprazole  40  mg Oral Daily  . potassium chloride  20 mEq Oral Daily  . predniSONE  20 mg Oral Q breakfast  . sertraline  50 mg Oral Daily  . umeclidinium bromide  1 puff Inhalation Daily   Continuous Infusions:   LOS: 0 days   Time Spent in minutes   45 minutes  Loisann Roach D.O. on 11/11/2020 at 11:26 AM  Between 7am to 7pm - Please see pager noted on [email or web address]  After 7pm go to [email or web address]  And look for the night coverage person covering for me after hours  [facility] Office  [phone]

## 2020-11-12 DIAGNOSIS — R52 Pain, unspecified: Secondary | ICD-10-CM

## 2020-11-12 DIAGNOSIS — N1832 Chronic kidney disease, stage 3b: Secondary | ICD-10-CM | POA: Diagnosis not present

## 2020-11-12 DIAGNOSIS — I5033 Acute on chronic diastolic (congestive) heart failure: Secondary | ICD-10-CM | POA: Diagnosis not present

## 2020-11-12 LAB — CBC
HCT: 35.1 % — ABNORMAL LOW (ref 36.0–46.0)
Hemoglobin: 10.8 g/dL — ABNORMAL LOW (ref 12.0–15.0)
MCH: 29.8 pg (ref 26.0–34.0)
MCHC: 30.8 g/dL (ref 30.0–36.0)
MCV: 97 fL (ref 80.0–100.0)
Platelets: 111 10*3/uL — ABNORMAL LOW (ref 150–400)
RBC: 3.62 MIL/uL — ABNORMAL LOW (ref 3.87–5.11)
RDW: 16.1 % — ABNORMAL HIGH (ref 11.5–15.5)
WBC: 9.2 10*3/uL (ref 4.0–10.5)
nRBC: 0 % (ref 0.0–0.2)

## 2020-11-12 LAB — BASIC METABOLIC PANEL
Anion gap: 7 (ref 5–15)
BUN: 37 mg/dL — ABNORMAL HIGH (ref 8–23)
CO2: 30 mmol/L (ref 22–32)
Calcium: 8.5 mg/dL — ABNORMAL LOW (ref 8.9–10.3)
Chloride: 100 mmol/L (ref 98–111)
Creatinine, Ser: 1.47 mg/dL — ABNORMAL HIGH (ref 0.44–1.00)
GFR, Estimated: 34 mL/min — ABNORMAL LOW (ref >60)
Glucose, Bld: 119 mg/dL — ABNORMAL HIGH (ref 70–99)
Potassium: 4.2 mmol/L (ref 3.5–5.1)
Sodium: 137 mmol/L (ref 135–145)

## 2020-11-12 LAB — MAGNESIUM: Magnesium: 2 mg/dL (ref 1.7–2.4)

## 2020-11-12 LAB — PHOSPHORUS: Phosphorus: 3.9 mg/dL (ref 2.5–4.6)

## 2020-11-12 MED ORDER — FUROSEMIDE 10 MG/ML IJ SOLN
40.0000 mg | Freq: Once | INTRAMUSCULAR | Status: AC
  Administered 2020-11-12: 40 mg via INTRAVENOUS
  Filled 2020-11-12: qty 4

## 2020-11-12 NOTE — Progress Notes (Signed)
Report received from Jessica RN. Agree with documented assessment. Will assume care of patient  

## 2020-11-12 NOTE — Progress Notes (Signed)
PROGRESS NOTE    [name]  [id number] DOB: [birth year] DOA: 11/10/2020 PCP: [name], NP   Brief Narrative:  HPI on 11/10/2020 by Dr. Neldon Mc Holtman is a 87 y.o. female with medical history significant for centrilobular emphysema follows with pulmonary, former smoker, permanent atrial fibrillation, colon cancer s/p resection, GERD, essential hypertension, who initially presented to [place] ED due to gradually worsening shortness of breath of 3 days duration associated with productive sputum with gray color.  Reports temperature of 99 at home.  Also reports intermittent headache.  No chest pain.  Has felt weaker.  Uses a walker.  CTA PE was done in the ED, no evidence of acute PE, showed new enlarged 1.5 cm mediastinal node adjacent to the right bronchus intermedius.  Patient was transferred to [facility].  TRH was asked to admit.  Interim history  Presented with shortness of breath, found to have CHF exacerbation, was placed on IV Lasix- however held due to AKI Assessment & Plan   Acute on chronic diastolic heart failure exacerbation with dyspnea on exertion -Echocardiogram 10/28/2017 showed EF 55 to 60% -Patient presented with shortness of breath, BNP 490 -Chest x-ray showed pulmonary edema -CTA chest unremarkable for PE -Echocardiogram: EF 60 to 65%, no regional wall motion abnormalities.  Mild concentric LV hypertrophy.  Diastolic function cannot be evaluated.  Interventricular septum flattened in diastole, consistent with right ventricular volume overload.  Moderately elevated pulmonary artery systolic pressure.  Mild to moderate MV regurgitation.  Moderate to severe TV regurgitation.  AV regurgitation is trivial. -Monitor intake output, daily  -Was placed on IV Lasix 20 mg twice daily- however given findings of echo, will give one time dose of IV lasix 40mg   Chronic kidney disease, stage IIIb -Likely secondary to IV diuresis -Creatinine up to 1.47, however GFR has ranged  in the 30s to 40s since 2019 -Continue to monitor BMP closely given diuresis  Permanent atrial fibrillation -Continue metoprolol for rate control -Continue Eliquis  Iron deficiency anemia -Continue iron supplementation  Chronic anxiety/depression -Continue Zoloft  GERD -Continue PPI  COPD -Patient on chronic steroids, prednisone  -Continue inhalers as needed -Currently stable, no wheezing noted on examination  Mediastinal node -CTA chest showed new enlargement 0.5 cm mediastinal node adjacent to the right bronchus intermedius -Patient need follow-up with pulmonology as outpatient  Generalized weakness -Patient uses walker -PT recommended home health -OT evaluated patient, no further needs  Epistaxis -Suspect secondary to supplemental oxygen -Resolved -Continue to monitor closely  Pain -Patient states she fell on her ankle approximately 3 years ago -She normally takes Tylenol at home however this is not working -Continue tramadol and hydrocodone, Voltaren gel for pain control  DVT Prophylaxis  Eliquis  Code Status: Full  Family Communication: None at bedside  Disposition Plan:  Status is: Inpatient  Remains inpatient appropriate because:IV treatments appropriate due to intensity of illness or inability to take PO   Dispo: The patient is from: Home              Anticipated d/c is to: Home              Patient currently is not medically stable to d/c.   Difficult to place patient No  Consultants None  Procedures  None  Antibiotics   Anti-infectives (From admission, onward)   None      Subjective:   [name] seen and examined today.  Feels mildly improved since admission. Complains of nausea and vomiting, however states  this occurs at home and she sees Dr. Henrene Pastor. Denies current nausea or vomiting. Denies chest pain, abdominal pain, dizziness, headache. Wants to go home soon to be with her dog. Denies further nose bleeding. Continues to have pain in  her foot and back.  Objective:   Vitals:   11/11/20 1410 11/11/20 1955 11/11/20 1956 11/12/20 0428  BP: 102/65 121/75  136/84  Pulse: 100 (!) 102  90  Resp:  16  16  Temp:  98.6 F (37 C)  (!) 97.5 F (36.4 C)  TempSrc:  Oral  Oral  SpO2: 91% 90% 93% 95%  Weight:    84.6 kg  Height:        Intake/Output Summary (Last 24 hours) at 11/12/2020 1153 Last data filed at 11/12/2020 0300 Gross per 24 hour  Intake 1080 ml  Output 900 ml  Net 180 ml   Filed Weights   11/10/20 1934 11/11/20 0326 11/12/20 0428  Weight: 83 kg 83 kg 84.6 kg    Exam  General: Well developed, elderly, chronically ill-appearing, NAD  HEENT: NCAT, mucous membranes moist.   Cardiovascular: S1 S2 auscultated, irregular  Respiratory: Diminished breath sounds, no wheezing or rhonchi  Abdomen: Soft, nontender, nondistended, + bowel sounds  Extremities: warm dry without cyanosis clubbing.   Neuro: AAOx3, nonfocal  Psych: Flat however appropriate mood and affect   Data Reviewed: I have personally reviewed following labs and imaging studies  CBC: Recent Labs  Lab 11/10/20 1400 11/12/20 0237  WBC 9.9 9.2  NEUTROABS 7.9*  --   HGB 12.5 10.8*  HCT 40.1 35.1*  MCV 95.7 97.0  PLT 136* [id number]*   Basic Metabolic Panel: Recent Labs  Lab 11/10/20 1311 11/11/20 0315 11/12/20 0237  NA 142 138 137  K 4.0 3.6 4.2  CL 104 99 100  CO2 27 26 30   GLUCOSE 114* 102* 119*  BUN 30* 25* 37*  CREATININE 0.98 1.06* 1.47*  CALCIUM 9.6 8.8* 8.5*  MG  --   --  2.0  PHOS  --   --  3.9   GFR: Estimated Creatinine Clearance: 27.3 mL/min (A) (by C-G formula based on SCr of 1.47 mg/dL (H)). Liver Function Tests: Recent Labs  Lab 11/10/20 1311  AST 32  ALT 17  ALKPHOS 58  BILITOT 1.0  PROT 7.1  ALBUMIN 4.1   Recent Labs  Lab 11/10/20 1311  LIPASE 31   No results for input(s): AMMONIA in the last 168 hours. Coagulation Profile: No results for input(s): INR, PROTIME in the last 168 hours. Cardiac  Enzymes: No results for input(s): CKTOTAL, CKMB, CKMBINDEX, TROPONINI in the last 168 hours. BNP (last 3 results) No results for input(s): PROBNP in the last 8760 hours. HbA1C: No results for input(s): HGBA1C in the last 72 hours. CBG: No results for input(s): GLUCAP in the last 168 hours. Lipid Profile: No results for input(s): CHOL, HDL, LDLCALC, TRIG, CHOLHDL, LDLDIRECT in the last 72 hours. Thyroid Function Tests: No results for input(s): TSH, T4TOTAL, FREET4, T3FREE, THYROIDAB in the last 72 hours. Anemia Panel: No results for input(s): VITAMINB12, FOLATE, FERRITIN, TIBC, IRON, RETICCTPCT in the last 72 hours. Urine analysis:    Component Value Date/Time   COLORURINE YELLOW 10/05/2019 1012   APPEARANCEUR CLEAR 10/05/2019 1012   LABSPEC 1.020 10/05/2019 1012   PHURINE 5.5 10/05/2019 1012   GLUCOSEU NEGATIVE 10/05/2019 1012   HGBUR NEGATIVE 10/05/2019 1012   HGBUR trace-lysed 11/08/2009 1028   BILIRUBINUR NEGATIVE 10/05/2019 1012   BILIRUBINUR n 08/25/2014  [place] 10/05/2019 1012   PROTEINUR NEGATIVE 08/03/2019 0813   UROBILINOGEN 0.2 10/05/2019 1012   NITRITE NEGATIVE 10/05/2019 1012   LEUKOCYTESUR NEGATIVE 10/05/2019 1012   Sepsis Labs: @LABRCNTIP (procalcitonin:4,lacticidven:4)  ) Recent Results (from the past 240 hour(s))  Resp Panel by RT-PCR (Flu A&B, Covid) Nasopharyngeal Swab     Status: None   Collection Time: 11/10/20  3:05 PM   Specimen: Nasopharyngeal Swab; Nasopharyngeal(NP) swabs in vial transport medium  Result Value Ref Range Status   SARS Coronavirus 2 by RT PCR NEGATIVE NEGATIVE Final    Comment: (NOTE) SARS-CoV-2 target nucleic acids are NOT DETECTED.  The SARS-CoV-2 RNA is generally detectable in upper respiratory specimens during the acute phase of infection. The lowest concentration of SARS-CoV-2 viral copies this assay can detect is 138 copies/mL. A negative result does not preclude SARS-Cov-2 infection and should not be used  as the sole basis for treatment or other patient management decisions. A negative result [place] occur with  improper specimen collection/handling, submission of specimen other than nasopharyngeal swab, presence of viral mutation(s) within the areas targeted by this assay, and inadequate number of viral copies(<138 copies/mL). A negative result must be combined with clinical observations, patient history, and epidemiological information. The expected result is Negative.  Fact Sheet for Patients:  EntrepreneurPulse.com.au  Fact Sheet for Healthcare Providers:  IncredibleEmployment.be  This test is no t yet approved or cleared by the [place] FDA and  has been authorized for detection and/or diagnosis of SARS-CoV-2 by FDA under an Emergency Use Authorization (EUA). This EUA will remain  in effect (meaning this test can be used) for the duration of the COVID-19 declaration under Section 564(b)(1) of the Act, 21 U.S.C.section 360bbb-3(b)(1), unless the authorization is terminated  or revoked sooner.       Influenza A by PCR NEGATIVE NEGATIVE Final   Influenza B by PCR NEGATIVE NEGATIVE Final    Comment: (NOTE) The Xpert Xpress SARS-CoV-2/FLU/RSV plus assay is intended as an aid in the diagnosis of influenza from Nasopharyngeal swab specimens and should not be used as a sole basis for treatment. Nasal washings and aspirates are unacceptable for Xpert Xpress SARS-CoV-2/FLU/RSV testing.  Fact Sheet for Patients: EntrepreneurPulse.com.au  Fact Sheet for Healthcare Providers: IncredibleEmployment.be  This test is not yet approved or cleared by the [place] FDA and has been authorized for detection and/or diagnosis of SARS-CoV-2 by FDA under an Emergency Use Authorization (EUA). This EUA will remain in effect (meaning this test can be used) for the duration of the COVID-19 declaration under Section 564(b)(1)  of the Act, 21 U.S.C. section 360bbb-3(b)(1), unless the authorization is terminated or revoked.  Performed at [facility], [address] [place], [place] [address]       Radiology Studies: CT Angio Chest PE W/Cm &/Or Wo Cm  Result Date: 11/10/2020 CLINICAL DATA:  Progressive shortness of breath EXAM: CT ANGIOGRAPHY CHEST WITH CONTRAST TECHNIQUE: Multidetector CT imaging of the chest was performed using the standard protocol during bolus administration of intravenous contrast. Multiplanar CT image reconstructions and MIPs were obtained to evaluate the vascular anatomy. CONTRAST:  161mL OMNIPAQUE IOHEXOL 350 MG/ML SOLN COMPARISON:  03/23/2020 FINDINGS: Cardiovascular: Satisfactory opacification of the pulmonary arteries to the segmental level. No evidence of pulmonary embolism. Cardiomegaly with biatrial enlargement. Coronary artery calcification. No pericardial effusion. Thoracic aorta is normal in caliber with mild calcified plaque. Mediastinum/Nodes: Enlarged node adjacent to the right bronchus intermedius measuring 1.5 cm short axis (series 4, image  82) additional nonenlarged mediastinal and hilar nodes. Hiatal hernia. Lungs/Pleura: No pleural effusion or pneumothorax. Mosaic attenuation. Upper Abdomen: No acute abnormality. Musculoskeletal: Lower cervical ACDF. Degenerative changes of the thoracic spine. Review of the MIP images confirms the above findings. IMPRESSION: No evidence of acute pulmonary embolism. Mosaic attenuation likely reflect obstructing small airways disease or edema. Cardiomegaly.  Coronary and aortic atherosclerosis. New enlarged 1.5 cm mediastinal node adjacent to the right bronchus intermedius. Electronically Signed   By: Macy Mis M.D.   On: 11/10/2020 16:14   DG Chest Port 1 View  Result Date: 11/10/2020 CLINICAL DATA:  Weakness and nausea over the last 2 weeks. EXAM: PORTABLE CHEST 1 VIEW COMPARISON:  08/02/2019 FINDINGS: Heart size upper  limits of normal. Atherosclerosis and tortuosity of the aorta. The upper lungs are clear. Question mild atelectasis or infiltrate at the lung bases. Consider two-view chest radiography if concern persists. No dense consolidation or lobar collapse. No visible effusion. No acute bone finding. Previous distal clavicular resections and ACDF. IMPRESSION: 1. Question mild atelectasis or infiltrate at the lung bases. Consider two-view chest radiography if concern persists. 2. No dense consolidation or lobar collapse. Electronically Signed   By: Nelson Chimes M.D.   On: 11/10/2020 14:46   ECHOCARDIOGRAM COMPLETE  Result Date: 11/11/2020    ECHOCARDIOGRAM REPORT   Patient Name:   [name] Date of Exam: 11/11/2020 Medical Rec #:  [id number]    Height:       63.0 in Accession #:    [id number]   Weight:       183.0 lb Date of Birth:  [birth year]    BSA:          1.862 m Patient Age:    14 years     BP:           102/65 mmHg Patient Gender: F            HR:           101 bpm. Exam Location:  Inpatient Procedure: 2D Echo, Cardiac Doppler and Color Doppler Indications:    I50.30* Unspecified diastolic (congestive) heart failure; I50.31                 Acute diastolic (congestive) heart failure  History:        Patient has prior history of Echocardiogram examinations, most                 recent 10/28/2017. CAD; Arrythmias:Atrial Fibrillation.  Sonographer:    Merrie Roof RDCS Referring Phys: [id number] [place]  1. Left ventricular ejection fraction, by estimation, is 60 to 65%. The left ventricle has normal function. The left ventricle has no regional wall motion abnormalities. There is mild concentric left ventricular hypertrophy. Left ventricular diastolic function could not be evaluated. There is the interventricular septum is flattened in diastole ('D' shaped left ventricle), consistent with right ventricular volume overload.  2. Right ventricular systolic function is normal. The right ventricular size is  normal. There is moderately elevated pulmonary artery systolic pressure. The estimated right ventricular systolic pressure is [id number] mmHg.  3. Left atrial size was moderately dilated.  4. Right atrial size was moderately dilated.  5. The mitral valve is normal in structure. Mild to moderate mitral valve regurgitation.  6. Tricuspid valve regurgitation is moderate to severe.  7. The aortic valve is normal in structure. Aortic valve regurgitation is trivial. No aortic stenosis is present.  8. The inferior vena cava is  dilated in size with <50% respiratory variability, suggesting right atrial pressure of 15 mmHg. Comparison(s): Tricuspid regurgitation and pulmonary artery hypertension have worsened. FINDINGS  Left Ventricle: Left ventricular ejection fraction, by estimation, is 60 to 65%. The left ventricle has normal function. The left ventricle has no regional wall motion abnormalities. The left ventricular internal cavity size was normal in size. There is  mild concentric left ventricular hypertrophy. The interventricular septum is flattened in diastole ('D' shaped left ventricle), consistent with right ventricular volume overload. Left ventricular diastolic function could not be evaluated due to atrial fibrillation. Left ventricular diastolic function could not be evaluated. Right Ventricle: The right ventricular size is normal. No increase in right ventricular wall thickness. Right ventricular systolic function is normal. There is moderately elevated pulmonary artery systolic pressure. The tricuspid regurgitant velocity is 3.29 m/s, and with an assumed right atrial pressure of 15 mmHg, the estimated right ventricular systolic pressure is 26.7 mmHg. Left Atrium: Left atrial size was moderately dilated. Right Atrium: Right atrial size was moderately dilated. Pericardium: There is no evidence of pericardial effusion. Mitral Valve: The mitral valve is normal in structure. Mild to moderate mitral valve regurgitation,  with centrally-directed jet. Tricuspid Valve: The tricuspid valve is normal in structure. Tricuspid valve regurgitation is moderate to severe. Aortic Valve: The aortic valve is normal in structure. Aortic valve regurgitation is trivial. No aortic stenosis is present. Aortic valve mean gradient measures 6.0 mmHg. Aortic valve peak gradient measures 11.0 mmHg. Aortic valve area, by VTI measures 1.98 cm. Pulmonic Valve: The pulmonic valve was grossly normal. Pulmonic valve regurgitation is not visualized. Aorta: The aortic root and ascending aorta are structurally normal, with no evidence of dilitation. Venous: The inferior vena cava is dilated in size with less than 50% respiratory variability, suggesting right atrial pressure of 15 mmHg. IAS/Shunts: No atrial level shunt detected by color flow Doppler.  LEFT VENTRICLE PLAX 2D LVIDd:         4.10 cm LVIDs:         2.60 cm LV PW:         1.20 cm LV IVS:        1.30 cm LVOT diam:     2.27 cm LV SV:         51 LV SV Index:   27 LVOT Area:     4.05 cm  RIGHT VENTRICLE            IVC RV Basal diam:  3.90 cm    IVC diam: 2.10 cm RV S prime:     8.27 cm/s TAPSE (M-mode): 1.5 cm LEFT ATRIUM              Index       RIGHT ATRIUM           Index LA diam:        4.50 cm  2.42 cm/m  RA Area:     26.40 cm LA Vol (A2C):   116.0 ml 62.30 ml/m RA Volume:   75.60 ml  40.60 ml/m LA Vol (A4C):   135.0 ml 72.50 ml/m LA Biplane Vol: 129.0 ml 69.28 ml/m  AORTIC VALVE AV Area (Vmax):    2.05 cm AV Area (Vmean):   1.93 cm AV Area (VTI):     1.98 cm AV Vmax:           166.00 cm/s AV Vmean:          117.000 cm/s AV VTI:  0.255 m AV Peak Grad:      11.0 mmHg AV Mean Grad:      6.0 mmHg LVOT Vmax:         84.25 cm/s LVOT Vmean:        55.800 cm/s LVOT VTI:          0.125 m LVOT/AV VTI ratio: 0.49  AORTA Ao Root diam: 3.10 cm Ao Asc diam:  3.50 cm TRICUSPID VALVE TR Peak grad:   43.3 mmHg TR Vmax:        329.00 cm/s  SHUNTS Systemic VTI:  0.12 m Systemic Diam: 2.27 cm Dani Gobble  Croitoru MD Electronically signed by Sanda Klein MD Signature Date/Time: 11/11/2020/4:54:58 PM    Final      Scheduled Meds: . allopurinol  100 mg Oral Daily  . apixaban  5 mg Oral BID  . diclofenac Sodium  2 g Topical QID  . ferrous sulfate  325 mg Oral Q breakfast  . metoprolol tartrate  12.5 mg Oral BID  . pantoprazole  40 mg Oral Daily  . predniSONE  20 mg Oral Q breakfast  . sertraline  50 mg Oral Daily  . umeclidinium bromide  1 puff Inhalation Daily   Continuous Infusions: . promethazine (PHENERGAN) injection (IM or IVPB)       LOS: 1 day   Time Spent in minutes   45 minutes  Niguel Moure D.O. on 11/12/2020 at 11:53 AM  Between 7am to 7pm - Please see pager noted on [email or web address]  After 7pm go to [email or web address]  And look for the night coverage person covering for me after hours  [facility] Office  [phone]

## 2020-11-12 NOTE — Discharge Summary (Signed)
Physician Discharge Summary  [name] WCB:[id number] DOB: Aug 04, 1929 DOA: 11/10/2020  PCP: [name], NP  Admit date: 11/10/2020 Discharge date: 11/13/2020  Time spent: 45 minutes  Recommendations for Outpatient Follow-up:  Patient will be discharged to home with home health services.  Patient will need to follow up with primary care provider within one week of discharge.  Follow up with orthopedics. Patient should continue medications as prescribed.  Patient should follow a heart healthy diet.   Discharge Diagnoses:  Acute on chronic diastolic heart failure exacerbation with dyspnea on exertion Permanent atrial fibrillation Iron deficiency anemia Chronic anxiety/depression GERD COPD Mediastinal node Generalized weakness Epistaxis Pain  Discharge Condition: Stable  Diet recommendation: heart healthy  Filed Weights   11/11/20 0326 11/12/20 0428 11/13/20 0152  Weight: 83 kg 84.6 kg 84.8 kg    History of present illness:  on 11/10/2020 by Dr. Neldon Mc Mayis an 87 y.o.femalewith medical history significant forcentrilobular emphysema follows with pulmonary, former smoker,permanentatrial fibrillation, colon cancer s/p resection,GERD, essential hypertension,who [facility] due to gradually worsening shortness of breath of 3days duration associated with productive sputum with gray color. Reports temperature of 99 at home. Also reports intermittent headache. No chest pain. Has felt weaker. Uses a walker. CTA PE was done in the ED, no evidence of acute PE, showed newenlarged 1.5 cm mediastinal node adjacent to the right bronchus intermedius. Patient was transferred to [facility]. [facility] was asked to admit.  Hospital Course:  Acute on chronic diastolic heart failure exacerbation with dyspnea on exertion -Echocardiogram 10/28/2017 showed EF 55 to 60% -Patient presented with shortness of breath, BNP 490 -Chest x-ray showed pulmonary  edema -CTA chest unremarkable for PE -Echocardiogram: EF 60 to 65%, no regional wall motion abnormalities.  Mild concentric LV hypertrophy.  Diastolic function cannot be evaluated.  Interventricular septum flattened in diastole, consistent with right ventricular volume overload.  Moderately elevated pulmonary artery systolic pressure.  Mild to moderate MV regurgitation.  Moderate to severe TV regurgitation.  AV regurgitation is trivial. -Monitor intake output, daily  weights-not sure that weights are accurate -Was placed on IV Lasix -Patient able to ambulate and remain on room air, with oxygen saturations in the high 90s -Continue losartan and metoprolol on discharge  Permanent atrial fibrillation -Continue metoprolol for rate control -Continue Eliquis  Iron deficiency anemia -Continue iron supplementation  Chronic anxiety/depression -Continue Zoloft  GERD -Continue PPI  COPD -Patient on chronic steroids, prednisone  -Continue inhalers as needed -Currently stable, no wheezing noted on examination  Mediastinal node -CTA chest showed new enlargement 0.5 cm mediastinal node adjacent to the right bronchus intermedius -Patient need follow-up with pulmonology as outpatient  Generalized weakness -Patient uses walker -PT recommended home health -No OT follow up needed  Epistaxis -Suspect secondary to supplemental oxygen -Resolved  Pain -Patient states she fell on her ankle approximately 3 years ago -Patient should follow up with orthopedic surgery, Dr. Marlou Sa -She normally takes Tylenol at home however this is not working -Continue hydrocodone and Voltaren gel for pain control  Procedures: Echocardiogram  Consultations: Cardiology, via phone  Discharge Exam: Vitals:   11/13/20 0504 11/13/20 0853  BP: (!) 157/97 119/70  Pulse: 86 83  Resp: 16   Temp: 98.1 F (36.7 C) 97.7 F (36.5 C)  SpO2: 99%      General: Well developed, elderly, chronically ill  appearing, NAD  HEENT: NCAT, mucous membranes moist.  Cardiovascular: S1 S2 auscultated, irregular  Respiratory: Clear to auscultation bilaterally  Abdomen:  Soft, nontender, nondistended, + bowel sounds  Extremities: warm dry without cyanosis clubbing or edema  Neuro: AAOx3, nonfocal  Psych: Appropriate mood and affect  Discharge Instructions Discharge Instructions    (HEART FAILURE PATIENTS) Call MD:  Anytime you have any of the following symptoms: 1) 3 pound weight gain in 24 hours or 5 pounds in 1 week 2) shortness of breath, with or without a dry hacking cough 3) swelling in the hands, feet or stomach 4) if you have to sleep on extra pillows at night in order to breathe.   Complete by: As directed    Amb Referral to HF Clinic   Complete by: As directed    Avoid straining   Complete by: As directed    Call MD for:  difficulty breathing, headache or visual disturbances   Complete by: As directed    Call MD for:  extreme fatigue   Complete by: As directed    Call MD for:  persistant dizziness or light-headedness   Complete by: As directed    Diet - low sodium heart healthy   Complete by: As directed    Discharge instructions   Complete by: As directed    Patient will be discharged to home with home health services.  Patient will need to follow up with primary care provider within one week of discharge.  Patient should continue medications as prescribed.  Patient should follow a heart healthy diet.   Face-to-face encounter (required for Medicare/Medicaid patients)   Complete by: As directed    I Cristal Ford certify that this patient is under my care and that I, or a nurse practitioner or physician's assistant working with me, had a face-to-face encounter that meets the physician face-to-face encounter requirements with this patient on 11/13/2020. The encounter with the patient was in whole, or in part for the following medical condition(s) which is the primary reason for home  health care (List medical condition): Acute CHF exac, ankle pain   The encounter with the patient was in whole, or in part, for the following medical condition, which is the primary reason for home health care: Acute CHF exac, ankle pain   I certify that, based on my findings, the following services are medically necessary home health services:  Physical therapy Nursing     Reason for Medically Necessary Home Health Services:  Therapy- Therapeutic Exercises to Increase Strength and Endurance Skilled Nursing- Teaching of Disease Process/Symptom Management     My clinical findings support the need for the above services: Pain interferes with ambulation/mobility   Further, I certify that my clinical findings support that this patient is homebound due to: Pain interferes with ambulation/mobility   Heart Failure patients record your daily weight using the same scale at the same time of day   Complete by: As directed    Home Health   Complete by: As directed    To provide the following care/treatments:  PT RN     Increase activity slowly   Complete by: As directed    STOP any activity that causes chest pain, shortness of breath, dizziness, sweating, or exessive weakness   Complete by: As directed      Allergies as of 11/13/2020   No Known Allergies     Medication List    STOP taking these medications   doxycycline 100 MG capsule Commonly known as: VIBRAMYCIN     TAKE these medications   acetaminophen 500 MG tablet Commonly known as: TYLENOL Take 1,000 mg by  mouth every 6 (six) hours as needed for moderate pain.   albuterol 108 (90 Base) MCG/ACT inhaler Commonly known as: ProAir HFA Inhale 2 puffs into the lungs every 6 (six) hours as needed for wheezing or shortness of breath.   allopurinol 100 MG tablet Commonly known as: ZYLOPRIM TAKE ONE TABLET BY MOUTH ONCE DAILY   apixaban 5 MG Tabs tablet Commonly known as: Eliquis Take 1 tablet (5 mg total) by mouth 2 (two) times  daily.   diclofenac Sodium 1 % Gel Commonly known as: VOLTAREN Apply 2 g topically 4 (four) times daily.   E-Z Spacer inhaler Use as instructed   esomeprazole 40 MG capsule Commonly known as: NEXIUM TAKE ONE CAPSULE BY MOUTH ONCE DAILY   famotidine 20 MG tablet Commonly known as: PEPCID TAKE ONE TABLET BY MOUTH EVERYDAY AT BEDTIME   ferrous sulfate 325 (65 FE) MG EC tablet Take 325 mg by mouth daily with breakfast.   furosemide 20 MG tablet Commonly known as: LASIX Take 2 tablets (40 mg total) by mouth daily.   gabapentin 600 MG tablet Commonly known as: NEURONTIN TAKE ONE TABLET BY MOUTH TWICE DAILY   HYDROcodone-acetaminophen 5-325 MG tablet Commonly known as: NORCO/VICODIN Take 1 tablet by mouth every 6 (six) hours as needed for severe pain.   hydrocortisone 2.5 % rectal cream Commonly known as: ANUSOL-HC Place 1 application rectally 2 (two) times daily.   LORazepam 0.5 MG tablet Commonly known as: ATIVAN TAKE ONE TABLET BY MOUTH EVERYDAY AT BEDTIME What changed: See the new instructions.   losartan 50 MG tablet Commonly known as: COZAAR Take 0.5 tablets (25 mg total) by mouth daily. What changed: how much to take   metoprolol tartrate 50 MG tablet Commonly known as: LOPRESSOR TAKE 1 AND 1/2 TABLETS BY MOUTH TWICE DAILY What changed: how much to take   nitroGLYCERIN 0.4 MG SL tablet Commonly known as: NITROSTAT Place 1 tablet (0.4 mg total) under the tongue every 5 (five) minutes as needed for chest pain (x 3 pills). Reported on 09/01/2015   predniSONE 20 MG tablet Commonly known as: DELTASONE Take 1 tablet (20 mg total) by mouth daily with breakfast.   sertraline 50 MG tablet Commonly known as: ZOLOFT TAKE ONE TABLET BY MOUTH ONCE DAILY   silver sulfADIAZINE 1 % cream Commonly known as: SILVADENE Apply 1 application topically 2 (two) times daily.   Spiriva Respimat 2.5 MCG/ACT Aers Generic drug: Tiotropium Bromide Monohydrate Inhale 2 puffs into  the lungs daily at 2 PM.   Spiriva Respimat 2.5 MCG/ACT Aers Generic drug: Tiotropium Bromide Monohydrate Inhale 2 puffs into the lungs daily.   traZODone 50 MG tablet Commonly known as: DESYREL TAKE ONE TABLET BY MOUTH EVERYDAY AT BEDTIME What changed: See the new instructions.   triamcinolone cream 0.1 % Commonly known as: KENALOG Apply 1 application topically 2 (two) times daily.   vitamin C 1000 MG tablet Take 1,000 mg by mouth daily.      No Known Allergies    The results of significant diagnostics from this hospitalization (including imaging, microbiology, ancillary and laboratory) are listed below for reference.    Significant Diagnostic Studies: CT Angio Chest PE W/Cm &/Or Wo Cm  Result Date: 11/10/2020 CLINICAL DATA:  Progressive shortness of breath EXAM: CT ANGIOGRAPHY CHEST WITH CONTRAST TECHNIQUE: Multidetector CT imaging of the chest was performed using the standard protocol during bolus administration of intravenous contrast. Multiplanar CT image reconstructions and MIPs were obtained to evaluate the vascular anatomy. CONTRAST:  155mL OMNIPAQUE IOHEXOL 350 MG/ML SOLN COMPARISON:  03/23/2020 FINDINGS: Cardiovascular: Satisfactory opacification of the pulmonary arteries to the segmental level. No evidence of pulmonary embolism. Cardiomegaly with biatrial enlargement. Coronary artery calcification. No pericardial effusion. Thoracic aorta is normal in caliber with mild calcified plaque. Mediastinum/Nodes: Enlarged node adjacent to the right bronchus intermedius measuring 1.5 cm short axis (series 4, image 82) additional nonenlarged mediastinal and hilar nodes. Hiatal hernia. Lungs/Pleura: No pleural effusion or pneumothorax. Mosaic attenuation. Upper Abdomen: No acute abnormality. Musculoskeletal: Lower cervical ACDF. Degenerative changes of the thoracic spine. Review of the MIP images confirms the above findings. IMPRESSION: No evidence of acute pulmonary embolism. Mosaic  attenuation likely reflect obstructing small airways disease or edema. Cardiomegaly.  Coronary and aortic atherosclerosis. New enlarged 1.5 cm mediastinal node adjacent to the right bronchus intermedius. Electronically Signed   By: Macy Mis M.D.   On: 11/10/2020 16:14   DG Chest Port 1 View  Result Date: 11/10/2020 CLINICAL DATA:  Weakness and nausea over the last 2 weeks. EXAM: PORTABLE CHEST 1 VIEW COMPARISON:  08/02/2019 FINDINGS: Heart size upper limits of normal. Atherosclerosis and tortuosity of the aorta. The upper lungs are clear. Question mild atelectasis or infiltrate at the lung bases. Consider two-view chest radiography if concern persists. No dense consolidation or lobar collapse. No visible effusion. No acute bone finding. Previous distal clavicular resections and ACDF. IMPRESSION: 1. Question mild atelectasis or infiltrate at the lung bases. Consider two-view chest radiography if concern persists. 2. No dense consolidation or lobar collapse. Electronically Signed   By: Nelson Chimes M.D.   On: 11/10/2020 14:46   ECHOCARDIOGRAM COMPLETE  Result Date: 11/11/2020    ECHOCARDIOGRAM REPORT   Patient Name:   [name] Broughton Date of Exam: 11/11/2020 Medical Rec #:  [id number]    Height:       63.0 in Accession #:    [id number]   Weight:       183.0 lb Date of Birth:  1929/08/31    BSA:          1.862 m Patient Age:    60 years     BP:           102/65 mmHg Patient Gender: F            HR:           101 bpm. Exam Location:  Inpatient Procedure: 2D Echo, Cardiac Doppler and Color Doppler Indications:    I50.30* Unspecified diastolic (congestive) heart failure; I50.31                 Acute diastolic (congestive) heart failure  History:        Patient has prior history of Echocardiogram examinations, most                 recent 10/28/2017. CAD; Arrythmias:Atrial Fibrillation.  Sonographer:    Merrie Roof RDCS Referring Phys: [id number] [place]  1. Left ventricular ejection fraction, by  estimation, is 60 to 65%. The left ventricle has normal function. The left ventricle has no regional wall motion abnormalities. There is mild concentric left ventricular hypertrophy. Left ventricular diastolic function could not be evaluated. There is the interventricular septum is flattened in diastole ('D' shaped left ventricle), consistent with right ventricular volume overload.  2. Right ventricular systolic function is normal. The right ventricular size is normal. There is moderately elevated pulmonary artery systolic pressure. The estimated right ventricular systolic pressure is 96.2 mmHg.  3. Left  atrial size was moderately dilated.  4. Right atrial size was moderately dilated.  5. The mitral valve is normal in structure. Mild to moderate mitral valve regurgitation.  6. Tricuspid valve regurgitation is moderate to severe.  7. The aortic valve is normal in structure. Aortic valve regurgitation is trivial. No aortic stenosis is present.  8. The inferior vena cava is dilated in size with <50% respiratory variability, suggesting right atrial pressure of 15 mmHg. Comparison(s): Tricuspid regurgitation and pulmonary artery hypertension have worsened. FINDINGS  Left Ventricle: Left ventricular ejection fraction, by estimation, is 60 to 65%. The left ventricle has normal function. The left ventricle has no regional wall motion abnormalities. The left ventricular internal cavity size was normal in size. There is  mild concentric left ventricular hypertrophy. The interventricular septum is flattened in diastole ('D' shaped left ventricle), consistent with right ventricular volume overload. Left ventricular diastolic function could not be evaluated due to atrial fibrillation. Left ventricular diastolic function could not be evaluated. Right Ventricle: The right ventricular size is normal. No increase in right ventricular wall thickness. Right ventricular systolic function is normal. There is moderately elevated  pulmonary artery systolic pressure. The tricuspid regurgitant velocity is 3.29 m/s, and with an assumed right atrial pressure of 15 mmHg, the estimated right ventricular systolic pressure is [id number]. Left Atrium: Left atrial size was moderately dilated. Right Atrium: Right atrial size was moderately dilated. Pericardium: There is no evidence of pericardial effusion. Mitral Valve: The mitral valve is normal in structure. Mild to moderate mitral valve regurgitation, with centrally-directed jet. Tricuspid Valve: The tricuspid valve is normal in structure. Tricuspid valve regurgitation is moderate to severe. Aortic Valve: The aortic valve is normal in structure. Aortic valve regurgitation is trivial. No aortic stenosis is present. Aortic valve mean gradient measures 6.0 mmHg. Aortic valve peak gradient measures 11.0 mmHg. Aortic valve area, by VTI measures 1.98 cm. Pulmonic Valve: The pulmonic valve was grossly normal. Pulmonic valve regurgitation is not visualized. Aorta: The aortic root and ascending aorta are structurally normal, with no evidence of dilitation. Venous: The inferior vena cava is dilated in size with less than 50% respiratory variability, suggesting right atrial pressure of 15 mmHg. IAS/Shunts: No atrial level shunt detected by color flow Doppler.  LEFT VENTRICLE PLAX 2D LVIDd:         4.10 cm LVIDs:         2.60 cm LV PW:         1.20 cm LV IVS:        1.30 cm LVOT diam:     2.27 cm LV SV:         51 LV SV Index:   27 LVOT Area:     4.05 cm  RIGHT VENTRICLE            IVC RV Basal diam:  3.90 cm    IVC diam: 2.10 cm RV S prime:     8.27 cm/s TAPSE (M-mode): 1.5 cm LEFT ATRIUM              Index       RIGHT ATRIUM           Index LA diam:        4.50 cm  2.42 cm/m  RA Area:     26.40 cm LA Vol (A2C):   116.0 ml 62.30 ml/m RA Volume:   75.60 ml  40.60 ml/m LA Vol (A4C):   135.0 ml 72.50 ml/m LA Biplane Vol: 129.0 ml  69.28 ml/m  AORTIC VALVE AV Area (Vmax):    2.05 cm AV Area (Vmean):   1.93  cm AV Area (VTI):     1.98 cm AV Vmax:           166.00 cm/s AV Vmean:          117.000 cm/s AV VTI:            0.255 m AV Peak Grad:      11.0 mmHg AV Mean Grad:      6.0 mmHg LVOT Vmax:         84.25 cm/s LVOT Vmean:        55.800 cm/s LVOT VTI:          0.125 m LVOT/AV VTI ratio: 0.49  AORTA Ao Root diam: 3.10 cm Ao Asc diam:  3.50 cm TRICUSPID VALVE TR Peak grad:   43.3 mmHg TR Vmax:        329.00 cm/s  SHUNTS Systemic VTI:  0.12 m Systemic Diam: 2.27 cm Dani Gobble Croitoru MD Electronically signed by Sanda Klein MD Signature Date/Time: 11/11/2020/4:54:58 PM    Final     Microbiology: Recent Results (from the past 240 hour(s))  Resp Panel by RT-PCR (Flu A&B, Covid) Nasopharyngeal Swab     Status: None   Collection Time: 11/10/20  3:05 PM   Specimen: Nasopharyngeal Swab; Nasopharyngeal(NP) swabs in vial transport medium  Result Value Ref Range Status   SARS Coronavirus 2 by RT PCR NEGATIVE NEGATIVE Final    Comment: (NOTE) SARS-CoV-2 target nucleic acids are NOT DETECTED.  The SARS-CoV-2 RNA is generally detectable in upper respiratory specimens during the acute phase of infection. The lowest concentration of SARS-CoV-2 viral copies this assay can detect is 138 copies/mL. A negative result does not preclude SARS-Cov-2 infection and should not be used as the sole basis for treatment or other patient management decisions. A negative result Syfert occur with  improper specimen collection/handling, submission of specimen other than nasopharyngeal swab, presence of viral mutation(s) within the areas targeted by this assay, and inadequate number of viral copies(<138 copies/mL). A negative result must be combined with clinical observations, patient history, and epidemiological information. The expected result is Negative.  Fact Sheet for Patients:  EntrepreneurPulse.com.au  Fact Sheet for Healthcare Providers:  IncredibleEmployment.be  This test is no t yet  approved or cleared by the [place] FDA and  has been authorized for detection and/or diagnosis of SARS-CoV-2 by FDA under an Emergency Use Authorization (EUA). This EUA will remain  in effect (meaning this test can be used) for the duration of the COVID-19 declaration under Section 564(b)(1) of the Act, 21 U.S.C.section 360bbb-3(b)(1), unless the authorization is terminated  or revoked sooner.       Influenza A by PCR NEGATIVE NEGATIVE Final   Influenza B by PCR NEGATIVE NEGATIVE Final    Comment: (NOTE) The Xpert Xpress SARS-CoV-2/FLU/RSV plus assay is intended as an aid in the diagnosis of influenza from Nasopharyngeal swab specimens and should not be used as a sole basis for treatment. Nasal washings and aspirates are unacceptable for Xpert Xpress SARS-CoV-2/FLU/RSV testing.  Fact Sheet for Patients: EntrepreneurPulse.com.au  Fact Sheet for Healthcare Providers: IncredibleEmployment.be  This test is not yet approved or cleared by the [place] FDA and has been authorized for detection and/or diagnosis of SARS-CoV-2 by FDA under an Emergency Use Authorization (EUA). This EUA will remain in effect (meaning this test can be used) for the duration of the COVID-19 declaration under  Section 564(b)(1) of the Act, 21 U.S.C. section 360bbb-3(b)(1), unless the authorization is terminated or revoked.  Performed at [facility], [address] [place], [place] [address]      Labs: Basic Metabolic Panel: Recent Labs  Lab 11/10/20 1311 11/11/20 0315 11/12/20 0237 11/13/20 0433  NA 142 138 137 140  K 4.0 3.6 4.2 3.7  CL 104 99 100 98  CO2 27 26 30 31   GLUCOSE 114* 102* 119* 95  BUN 30* 25* 37* 37*  CREATININE 0.98 1.06* 1.47* 1.30*  CALCIUM 9.6 8.8* 8.5* 8.9  MG  --   --  2.0  --   PHOS  --   --  3.9  --    Liver Function Tests: Recent Labs  Lab 11/10/20 1311  AST 32  ALT 17  ALKPHOS 58  BILITOT 1.0   PROT 7.1  ALBUMIN 4.1   Recent Labs  Lab 11/10/20 1311  LIPASE 31   No results for input(s): AMMONIA in the last 168 hours. CBC: Recent Labs  Lab 11/10/20 1400 11/12/20 0237  WBC 9.9 9.2  NEUTROABS 7.9*  --   HGB 12.5 10.8*  HCT 40.1 35.1*  MCV 95.7 97.0  PLT 136* 111*   Cardiac Enzymes: No results for input(s): CKTOTAL, CKMB, CKMBINDEX, TROPONINI in the last 168 hours. BNP: BNP (last 3 results) Recent Labs    11/10/20 1400  BNP 490.6*    ProBNP (last 3 results) No results for input(s): PROBNP in the last 8760 hours.  CBG: No results for input(s): GLUCAP in the last 168 hours.     Signed:  Cristal Ford  [facility] 11/13/2020, 10:55 AM

## 2020-11-13 DIAGNOSIS — I5033 Acute on chronic diastolic (congestive) heart failure: Secondary | ICD-10-CM | POA: Diagnosis not present

## 2020-11-13 DIAGNOSIS — I10 Essential (primary) hypertension: Secondary | ICD-10-CM | POA: Diagnosis not present

## 2020-11-13 DIAGNOSIS — R531 Weakness: Secondary | ICD-10-CM | POA: Diagnosis not present

## 2020-11-13 DIAGNOSIS — R52 Pain, unspecified: Secondary | ICD-10-CM | POA: Diagnosis not present

## 2020-11-13 LAB — BASIC METABOLIC PANEL
Anion gap: 11 (ref 5–15)
BUN: 37 mg/dL — ABNORMAL HIGH (ref 8–23)
CO2: 31 mmol/L (ref 22–32)
Calcium: 8.9 mg/dL (ref 8.9–10.3)
Chloride: 98 mmol/L (ref 98–111)
Creatinine, Ser: 1.3 mg/dL — ABNORMAL HIGH (ref 0.44–1.00)
GFR, Estimated: 40 mL/min — ABNORMAL LOW (ref >60)
Glucose, Bld: 95 mg/dL (ref 70–99)
Potassium: 3.7 mmol/L (ref 3.5–5.1)
Sodium: 140 mmol/L (ref 135–145)

## 2020-11-13 MED ORDER — DICLOFENAC SODIUM 1 % EX GEL: 2.0000 g | Freq: Four times a day (QID) | CUTANEOUS | 0 refills | Status: DC

## 2020-11-13 MED ORDER — LOSARTAN POTASSIUM 50 MG PO TABS: 25.0000 mg | ORAL_TABLET | Freq: Every day | ORAL | 0 refills | Status: DC

## 2020-11-13 MED ORDER — HYDROCODONE-ACETAMINOPHEN 5-325 MG PO TABS: 1.0000 | ORAL_TABLET | Freq: Four times a day (QID) | ORAL | 0 refills | Status: AC | PRN

## 2020-11-13 MED ORDER — FUROSEMIDE 10 MG/ML IJ SOLN
40.0000 mg | Freq: Once | INTRAMUSCULAR | Status: AC
  Administered 2020-11-13: 40 mg via INTRAVENOUS
  Filled 2020-11-13: qty 4

## 2020-11-13 NOTE — Progress Notes (Signed)
Physical Therapy Treatment Patient Details Name: [name] MRN: [id number] DOB: 08-27-1929 Today's Date: 11/13/2020    History of Present Illness The pt is an 87 yo female presenting 4/29 with c/o x2 weeks nausea and SOB with activity. CT angio of chest negative, concern for CHF exacerbation. PMH includes: CHF, CAD, COPD, colon cancer s/p resection, afib, DVT, TIA, and pulm HTN.    PT Comments    Patient reports pain in right ankle today impacting mobility. Requires Min-mod A to stand from different surface heights with use of RW. Tolerated short distance ambulation with Min guard assist for support. Difficulty due to pain in right ankle limiting distance. Sp02 remained >95% on RA throughout activity. HR up to 133 bpm max. + productive cough worsened with movement. Encouraged walking to bathroom with nursing as able. RN aware of need for pain meds. Will follow and progress as able.    Follow Up Recommendations  Home health PT;Supervision - Intermittent     Equipment Recommendations  None recommended by PT    Recommendations for Other Services       Precautions / Restrictions Precautions Precautions: Fall Restrictions Weight Bearing Restrictions: No    Mobility  Bed Mobility Overal bed mobility: Needs Assistance Bed Mobility: Supine to Sit     Supine to sit: Min guard;HOB elevated     General bed mobility comments: Increased time but no assist needed, use of rail (pt normally sleeps in recliner).    Transfers Overall transfer level: Needs assistance Equipment used: Rolling walker (2 wheeled) Transfers: Sit to/from Stand Sit to Stand: Min assist;From elevated surface;Mod assist         General transfer comment: Light Min A to stand from elevated surface (to mimic recliner height at home) and mod A from low toilet using grab bar. Use of momentum, does better with arm rests to push up from.  Ambulation/Gait Ambulation/Gait assistance: Min guard Gait Distance  (Feet): 16 Feet (+ 22') Assistive device: Rolling walker (2 wheeled) Gait Pattern/deviations: Step-to pattern;Antalgic;Trunk flexed;Narrow base of support;Decreased stance time - right;Decreased step length - left;Decreased weight shift to right Gait velocity: decreased   General Gait Details: slow, antalgic gait with decreased stance on RLE due to pain, 2/4 DOE. Sp02 remained in high 90s on RA throughout. Significant trunk flexion, despite cues for posture.   Stairs             Wheelchair Mobility    Modified Rankin (Stroke Patients Only)       Balance Overall balance assessment: Needs assistance Sitting-balance support: Feet supported;No upper extremity supported Sitting balance-Leahy Scale: Fair Sitting balance - Comments: total A to donn shoes sitting EOB.   Standing balance support: During functional activity Standing balance-Leahy Scale: Poor Standing balance comment: Requires Ue support in standing.                            Cognition Arousal/Alertness: Awake/alert Behavior During Therapy: WFL for tasks assessed/performed Overall Cognitive Status: Within Functional Limits for tasks assessed                                        Exercises      General Comments General comments (skin integrity, edema, etc.): Sp02 remained >95% on RA throughout activity. HR up to 133 bpm max. + productive cough worsened with movement.  Pertinent Vitals/Pain Pain Assessment: 0-10 Pain Score: 8  Pain Location: right ankle Pain Descriptors / Indicators: Discomfort;Grimacing;Sore Pain Intervention(s): Monitored during session;Repositioned;Patient requesting pain meds-RN notified;Limited activity within patient's tolerance    Home Living                      Prior Function            PT Goals (current goals can now be found in the care plan section) Progress towards PT goals: Not progressing toward goals - comment (due to right  ankle pain)    Frequency    Min 3X/week      PT Plan Current plan remains appropriate    Co-evaluation              AM-PAC PT "6 Clicks" Mobility   Outcome Measure  Help needed turning from your back to your side while in a flat bed without using bedrails?: A Little Help needed moving from lying on your back to sitting on the side of a flat bed without using bedrails?: A Little Help needed moving to and from a bed to a chair (including a wheelchair)?: A Little Help needed standing up from a chair using your arms (e.g., wheelchair or bedside chair)?: A Lot Help needed to walk in hospital room?: A Little Help needed climbing 3-5 steps with a railing? : A Lot 6 Click Score: 16    End of Session Equipment Utilized During Treatment: Gait belt Activity Tolerance: Patient limited by pain Patient left: in chair;with call bell/phone within reach;with chair alarm set Nurse Communication: Mobility status PT Visit Diagnosis: Other abnormalities of gait and mobility (R26.89);Pain Pain - Right/Left: Right Pain - part of body: Ankle and joints of foot     Time: 1740-8144 PT Time Calculation (min) (ACUTE ONLY): 31 min  Charges:  $Gait Training: 8-22 mins $Therapeutic Activity: 8-22 mins                     Marisa Severin, PT, DPT Acute Rehabilitation Services Pager [phone] Office [place] 11/13/2020, 9:49 AM

## 2020-11-13 NOTE — TOC Transition Note (Signed)
Transition of Care [facility]) - CM/SW Discharge Note   Patient Details  Name: [name] MRN: [id number] Date of Birth: 1929-09-18  [facility]) CM/SW Contact:  Whitney Mayo, RN Phone Number: 11/13/2020, 11:52 AM   Clinical Narrative:    NCM spoke with patient at bedside offered choice for HHRN, HHPT.  She states she has no preference.  NCM made referral to [facility] with [place]. Patient has a 2 wheel walker, rollator, bsc, home scale and bp cuff, she has a pill box.  She states she eats very little salt.  She states she will be staying at her daughters [name])  house at [address] , [place]  , [phone]. Awaiting to hear back from [place].     Final next level of care: [place] Barriers to Discharge: No Barriers Identified   Patient Goals and CMS Choice Patient states their goals for this hospitalization and ongoing recovery are:: return home CMS Medicare.gov Compare Post Acute Care list provided to:: Patient Choice offered to / list presented to : Patient  Discharge Placement                       Discharge Plan and Services                  DME Agency: NA       HH Arranged: RN,PT,Disease Management HH Agency:  [name]) Date HH Agency Contacted: 11/13/20 Time [place]: [phone] Representative spoke with at [place] at [place]: [place] ([place]) Interventions     Readmission Risk Interventions No flowsheet data found.

## 2020-11-13 NOTE — Discharge Instructions (Signed)
Information on my medicine - ELIQUIS® (apixaban) ° °Why was Eliquis® prescribed for you? °Eliquis® was prescribed for you to reduce the risk of a blood clot forming that can cause a stroke if you have a medical condition called atrial fibrillation (a type of irregular heartbeat). ° °What do You need to know about Eliquis® ? °Take your Eliquis® TWICE DAILY - one tablet in the morning and one tablet in the evening with or without food. If you have difficulty swallowing the tablet whole please discuss with your pharmacist how to take the medication safely. ° °Take Eliquis® exactly as prescribed by your doctor and DO NOT stop taking Eliquis® without talking to the doctor who prescribed the medication.  Stopping Briles increase your risk of developing a stroke.  Refill your prescription before you run out. ° °After discharge, you should have regular check-up appointments with your healthcare provider that is prescribing your Eliquis®.  In the future your dose Muh[place]auer need to be changed if your kidney function or weight changes by a significant amount or as you get older. ° °What do you do if you miss a dose? °If you miss a dose, take it as soon as you remember on the same day and resume taking twice daily.  Do not take more than one dose of ELIQUIS at the same time to make up a missed dose. ° °Important Safety Information °A possible side effect of Eliquis® is bleeding. You should call your healthcare provider right away if you experience any of the following: °? Bleeding from an injury or your nose that does not stop. °? Unusual colored urine (red or dark brown) or unusual colored stools (red or black). °? Unusual bruising for unknown reasons. °? A serious fall or if you hit your head (even if there is no bleeding). ° °Some medicines Stys interact with Eliquis® and might increase your risk of bleeding or clotting while on Eliquis®. To help avoid this, consult your healthcare provider or pharmacist prior to using any new  prescription or non-prescription medications, including herbals, vitamins, non-steroidal anti-inflammatory drugs (NSAIDs) and supplements. ° °This website has more information on Eliquis® (apixaban): http://www.eliquis.com/eliquis/home ° °

## 2020-11-13 NOTE — Plan of Care (Signed)
  Problem: Acute Rehab PT Goals(only PT should resolve) Goal: Pt Will Go Supine/Side To Sit Outcome: Adequate for Discharge Goal: Patient Will Transfer Sit To/From Stand Outcome: Adequate for Discharge Goal: Pt Will Ambulate Outcome: Adequate for Discharge Goal: Pt/caregiver will Perform Home Exercise Program Outcome: Adequate for Discharge   Problem: Education: Goal: Knowledge of General Education information will improve Description: Including pain rating scale, medication(s)/side effects and non-pharmacologic comfort measures Outcome: Adequate for Discharge   Problem: Health Behavior/Discharge Planning: Goal: Ability to manage health-related needs will improve Outcome: Adequate for Discharge   Problem: Clinical Measurements: Goal: Ability to maintain clinical measurements within normal limits will improve Outcome: Adequate for Discharge Goal: Will remain free from infection Outcome: Adequate for Discharge Goal: Diagnostic test results will improve Outcome: Adequate for Discharge Goal: Respiratory complications will improve Outcome: Adequate for Discharge Goal: Cardiovascular complication will be avoided Outcome: Adequate for Discharge

## 2020-11-13 NOTE — Progress Notes (Signed)
Occupational Therapy Treatment Patient Details Name: [name] MRN: [id number] DOB: May 16, 1930 Today's Date: 11/13/2020    History of present illness The pt is an 87 yo female presenting 4/29 with c/o x2 weeks nausea and SOB with activity. CT angio of chest negative, concern for CHF exacerbation. PMH includes: CHF, CAD, COPD, colon cancer s/p resection, afib, DVT, TIA, and pulm HTN.   OT comments  Pt making good progress with functional goals. Session focused on ADLs, toilet transfers, ADL mobility using RW and energy conservation trg education.   Follow Up Recommendations  No OT follow up;Supervision - Intermittent    Equipment Recommendations   none   Recommendations for Other Services      Precautions / Restrictions Precautions Precautions: Fall Precaution Comments: watch SpO2 Restrictions Weight Bearing Restrictions: No       Mobility Bed Mobility               General bed mobility comments: pt in recliner upon arrival    Transfers Overall transfer level: Needs assistance Equipment used: Rolling walker (2 wheeled) Transfers: Sit to/from Stand Sit to Stand: Min assist;From elevated surface              Balance Overall balance assessment: Needs assistance Sitting-balance support: Feet supported;No upper extremity supported Sitting balance-Leahy Scale: Fair     Standing balance support: During functional activity;Bilateral upper extremity supported Standing balance-Leahy Scale: Poor                             ADL either performed or assessed with clinical judgement   ADL Overall ADL's : Needs assistance/impaired     Grooming: Wash/dry hands;Wash/dry face;Min guard;Standing   Upper Body Bathing: Set up;Supervision/ safety;Sitting       Upper Body Dressing : Set up;Supervision/safety;Sitting   Lower Body Dressing: Min guard;Sit to/from stand   Toilet Transfer: Minimal assistance;Min guard;Ambulation;RW;BSC   Toileting-  Water quality scientist and Hygiene: Min guard;Sit to/from stand       Functional mobility during ADLs: Minimal assistance;Min guard;Rolling walker General ADL Comments: pt instructed on energy conservation for ADLs and ADL mobility     Vision Baseline Vision/History: Wears glasses Wears Glasses: At all times Patient Visual Report: No change from baseline     Perception     Praxis      Cognition Arousal/Alertness: Awake/alert Behavior During Therapy: WFL for tasks assessed/performed Overall Cognitive Status: Within Functional Limits for tasks assessed                                          Exercises     Shoulder Instructions       General Comments      Pertinent Vitals/ Pain       Pain Assessment: 0-10 Pain Score: 6  Pain Location: right ankle Pain Descriptors / Indicators: Discomfort;Grimacing;Sore Pain Intervention(s): Limited activity within patient's tolerance;Monitored during session;Repositioned;Premedicated before session  Home Living                                          Prior Functioning/Environment              Frequency  Min 2X/week        Progress Toward Goals  OT Goals(current goals  can now be found in the care plan section)  Progress towards OT goals: Progressing toward goals  Acute Rehab OT Goals Patient Stated Goal: return home to [facility], to be able to walk to mailbox and back  Plan Discharge plan remains appropriate    Co-evaluation                 AM-PAC OT "6 Clicks" Daily Activity     Outcome Measure   Help from another person eating meals?: None Help from another person taking care of personal grooming?: A Little Help from another person toileting, which includes using toliet, bedpan, or urinal?: A Little Help from another person bathing (including washing, rinsing, drying)?: A Little Help from another person to put on and taking off regular upper body clothing?: None Help  from another person to put on and taking off regular lower body clothing?: A Little 6 Click Score: 20    End of Session Equipment Utilized During Treatment: Gait belt;Other (comment) (BSC)  OT Visit Diagnosis: Muscle weakness (generalized) (M62.81);Other abnormalities of gait and mobility (R26.89);Pain Pain - Right/Left: Right Pain - part of body: Ankle and joints of foot   Activity Tolerance Patient tolerated treatment well   Patient Left with call bell/phone within reach;in chair;with chair alarm set   Nurse Communication          Time: 7867-6720 OT Time Calculation (min): 25 min  Charges: OT General Charges $OT Visit: 1 Visit OT Treatments $Self Care/Home Management : 8-22 mins $Therapeutic Activity: 8-22 mins     Britt Bottom 11/13/2020, 2:29 PM

## 2020-11-13 NOTE — Progress Notes (Signed)
D/C instructions given and reviewed. Tele and IV removed, tolerated well. Awaiting daughter to arrive for transport home.

## 2020-11-15 ENCOUNTER — Telehealth: Payer: Self-pay | Admitting: Adult Health

## 2020-11-15 NOTE — Telephone Encounter (Signed)
Spoke with Estill Bamberg and informed her of the approval as below.

## 2020-11-15 NOTE — Telephone Encounter (Signed)
[name] from [facility] call and stated she need to know will [name] sign orders for Nursing and therapy .[name] want a call back at [phone].

## 2020-11-15 NOTE — Telephone Encounter (Signed)
That's fine

## 2020-11-16 ENCOUNTER — Telehealth: Payer: Self-pay

## 2020-11-16 DIAGNOSIS — Z87891 Personal history of nicotine dependence: Secondary | ICD-10-CM | POA: Diagnosis not present

## 2020-11-16 DIAGNOSIS — E785 Hyperlipidemia, unspecified: Secondary | ICD-10-CM | POA: Diagnosis not present

## 2020-11-16 DIAGNOSIS — Z8673 Personal history of transient ischemic attack (TIA), and cerebral infarction without residual deficits: Secondary | ICD-10-CM | POA: Diagnosis not present

## 2020-11-16 DIAGNOSIS — G629 Polyneuropathy, unspecified: Secondary | ICD-10-CM | POA: Diagnosis not present

## 2020-11-16 DIAGNOSIS — Z86718 Personal history of other venous thrombosis and embolism: Secondary | ICD-10-CM | POA: Diagnosis not present

## 2020-11-16 DIAGNOSIS — M810 Age-related osteoporosis without current pathological fracture: Secondary | ICD-10-CM | POA: Diagnosis not present

## 2020-11-16 DIAGNOSIS — Z85038 Personal history of other malignant neoplasm of large intestine: Secondary | ICD-10-CM | POA: Diagnosis not present

## 2020-11-16 DIAGNOSIS — I272 Pulmonary hypertension, unspecified: Secondary | ICD-10-CM | POA: Diagnosis not present

## 2020-11-16 DIAGNOSIS — I110 Hypertensive heart disease with heart failure: Secondary | ICD-10-CM | POA: Diagnosis not present

## 2020-11-16 DIAGNOSIS — K449 Diaphragmatic hernia without obstruction or gangrene: Secondary | ICD-10-CM | POA: Diagnosis not present

## 2020-11-16 DIAGNOSIS — I5033 Acute on chronic diastolic (congestive) heart failure: Secondary | ICD-10-CM | POA: Diagnosis not present

## 2020-11-16 DIAGNOSIS — D509 Iron deficiency anemia, unspecified: Secondary | ICD-10-CM | POA: Diagnosis not present

## 2020-11-16 DIAGNOSIS — Z7901 Long term (current) use of anticoagulants: Secondary | ICD-10-CM | POA: Diagnosis not present

## 2020-11-16 DIAGNOSIS — M1990 Unspecified osteoarthritis, unspecified site: Secondary | ICD-10-CM | POA: Diagnosis not present

## 2020-11-16 DIAGNOSIS — F419 Anxiety disorder, unspecified: Secondary | ICD-10-CM | POA: Diagnosis not present

## 2020-11-16 DIAGNOSIS — I4821 Permanent atrial fibrillation: Secondary | ICD-10-CM | POA: Diagnosis not present

## 2020-11-16 DIAGNOSIS — K219 Gastro-esophageal reflux disease without esophagitis: Secondary | ICD-10-CM | POA: Diagnosis not present

## 2020-11-16 DIAGNOSIS — J432 Centrilobular emphysema: Secondary | ICD-10-CM | POA: Diagnosis not present

## 2020-11-16 DIAGNOSIS — J9859 Other diseases of mediastinum, not elsewhere classified: Secondary | ICD-10-CM | POA: Diagnosis not present

## 2020-11-16 DIAGNOSIS — I2510 Atherosclerotic heart disease of native coronary artery without angina pectoris: Secondary | ICD-10-CM | POA: Diagnosis not present

## 2020-11-16 DIAGNOSIS — F32A Depression, unspecified: Secondary | ICD-10-CM | POA: Diagnosis not present

## 2020-11-16 DIAGNOSIS — J45909 Unspecified asthma, uncomplicated: Secondary | ICD-10-CM | POA: Diagnosis not present

## 2020-11-21 NOTE — Telephone Encounter (Signed)
Transition Care Management Unsuccessful Follow-up Telephone Call Late entry from 5/11/13/2020 Date of discharge and from where:  11/15/2020  [name]   Attempts:  1st Attempt  Reason for unsuccessful TCM follow-up call:  Left voice message

## 2020-11-22 ENCOUNTER — Ambulatory Visit (INDEPENDENT_AMBULATORY_CARE_PROVIDER_SITE_OTHER): Payer: HMO | Admitting: Adult Health

## 2020-11-22 ENCOUNTER — Other Ambulatory Visit: Payer: Self-pay

## 2020-11-22 VITALS — BP 118/80 | HR 85 | Temp 98.2°F | Ht 63.0 in | Wt 188.0 lb

## 2020-11-22 DIAGNOSIS — I5033 Acute on chronic diastolic (congestive) heart failure: Secondary | ICD-10-CM | POA: Diagnosis not present

## 2020-11-22 DIAGNOSIS — R911 Solitary pulmonary nodule: Secondary | ICD-10-CM | POA: Diagnosis not present

## 2020-11-22 DIAGNOSIS — I10 Essential (primary) hypertension: Secondary | ICD-10-CM | POA: Diagnosis not present

## 2020-11-22 DIAGNOSIS — I25118 Atherosclerotic heart disease of native coronary artery with other forms of angina pectoris: Secondary | ICD-10-CM | POA: Diagnosis not present

## 2020-11-22 DIAGNOSIS — I482 Chronic atrial fibrillation, unspecified: Secondary | ICD-10-CM | POA: Diagnosis not present

## 2020-11-22 DIAGNOSIS — M1A0410 Idiopathic chronic gout, right hand, without tophus (tophi): Secondary | ICD-10-CM

## 2020-11-22 MED ORDER — ELIQUIS 5 MG PO TABS
5.0000 mg | ORAL_TABLET | Freq: Two times a day (BID) | ORAL | 3 refills | Status: DC
Start: 2020-11-22 — End: 2020-11-27

## 2020-11-22 MED ORDER — PREDNISONE 20 MG PO TABS: 20.0000 mg | ORAL_TABLET | Freq: Every day | ORAL | 0 refills | Status: DC

## 2020-11-22 NOTE — Progress Notes (Signed)
Subjective:    Patient ID: [name], female    DOB: [birth year], 87 y.o.   MRN: [id number]  HPI She is a pleasant 87 year old female who  has a past medical history of Adenocarcinoma, colon ([place]) (Oncologist-- Dr Truitt Merle), Allergy, Anxiety, Asthma, CAD (coronary artery disease), Cataract, Chronic anemia, Chronic diastolic CHF (congestive heart failure) ([place]), Clotting disorder ([place]), Colostomy in place [facility]), Colovaginal fistula, COPD with chronic bronchitis ([place]), Depression, Dysrhythmia, Essential hypertension, GERD (gastroesophageal reflux disease), History of adenomatous polyp of colon, History of blood transfusion, History of DVT of lower extremity, History of hiatal hernia, History of TIA (transient ischemic attack), Hyperlipidemia, OA (osteoarthritis), Osteoporosis, Peripheral neuropathy, Pneumonia, Pulmonary HTN ([place]), Renal insufficiency, Sigmoid diverticulosis, Stroke ([place]), Wears dentures, and Wears glasses.  She presents to the office today for TCM visit   Admit Date 11/10/2020 Discharge Date 11/13/2020  She originally presented to [facility] ED due to gradually worsening shortness of breath x3 days associated with productive sputum with gray color.  She reported a temperature of 99 degrees at home.  Also reporting intermittent headache.  Denied chest pain.  CTA PE was done in the ED with no evidence of acute PE, showed new enlarged 1.5 cm mediastinal node adjacent to the right bronchus intermedius.  Patient was transferred to [facility] for admission  Hospital Course  1.  Acute on chronic diastolic heart failure exacerbation with dyspnea on exertion -Echocardiogram on 10/28/2017 showed EF of 55 to 60% -BNP 490 -Chest x-ray showed pulmonary edema -CTA chest unremarkable for PE -Repeat echo showed an EF of 60 to 65%, no regional wall motion abnormalities.  Mild concentric LV hypertrophy.  Diastolic function could not be evaluated.  Interventricular septum flattened and  diastole, consistent with right ventricular volume overload.  Moderately elevated pulmonary artery systolic pressure.  Mild to moderate MV regurgitation.  Moderate to severe TV regurgitation.  AV regurgitation was trivial -Placed on IV Lasix -Patient able to ambulate on room air, with oxygen saturations in the high 90s -Was continued on losartan and metoprolol on discharge  2. Permanent atrial fibrillation -Continue metoprolol for rate control and Eliquis for anticoagulation  3.  Iron deficiency anemia -Continue iron supplementation  4.  Chronic anxiety and depression -Continue Zoloft  5.  GERD -Continue PPI  6.  COPD -Patient on chronic steroids-prednisone -Sinew inhalers as needed  7.  Mediastinal node -CTA chest showed new enlargement 0.5 cm mediastinal node adjacent to the right bronchus intermedius -Commended follow-up with pulmonary  8.  Generalized weakness -Continue using a walker at home -PT recommended home health  Today, she presents with her daughter.  Patient reports that she continues to feel weak but feels as though she is making strides in getting her strength back.  PT will start in the home tomorrow.  She does have an appointment with pulmonary set for early next week, has not made an ointment with cardiology yet.  Daughter reports that she is having to use her inhaler more often, earlier this week she went through noted day of constant nonproductive cough but this seems to have resolved.  Her weight has remained stable.   Her biggest complaint is that of an acute gout flare on the DIP joint on her right middle finger.  Reports that she had some prednisone leftover from her previous gout flare, has taken 2 tabs and has noticed some improvement in the redness, warmth, and swelling of the joint, but she is out of her medication.  Review of Systems  Constitutional: Positive for fatigue.  Respiratory: Negative.   Cardiovascular: Negative.   Gastrointestinal:  Negative.   Genitourinary: Negative.   Musculoskeletal: Positive for arthralgias, gait problem and joint swelling.  Skin: Negative.   Neurological: Positive for weakness.  Hematological: Negative.   Psychiatric/Behavioral: Negative.   All other systems reviewed and are negative.    Past Medical History:  Diagnosis Date  . Adenocarcinoma, colon [facility]) Oncologist-- Dr Truitt Merle   Multifocal (2) Colon cancer @ ileocecal valve and ascending ( [id number]), Stage IIIB, grade I, MMR normal , 2 of 16 +lymph nodes, negative surgical margins---  06-02-2014  Right hemicolectomy w/ colostomy  . Allergy   . Anxiety   . Asthma    inhaler "sometimes"  . CAD (coronary artery disease)    a. LHC 4/09: pLAD 40, mDx 70-75, pLCx 30, mLCx 50, mRCA 90, EF 60% >> PCI: BMS x2 to RCA;  b. Myoview 8/12: normal  . Cataract    bil cateracts removed  . Chronic anemia   . Chronic diastolic CHF (congestive heart failure) (HCC)    a. Echo 912 - Mild LVH, EF 55-60%, no RWMA, Gr 1 DD, mild MR, mild LAE, mild RAE, PASP 59 mmHg (mod to severe pulmo HTN)  . Clotting disorder (HCC)    hx of dvt, tia on eliquis  . Colostomy in place [facility])    s/p colostomy takedown 5/16  . Colovaginal fistula    s/p colostomy >> colostomy takedown 5/16  . COPD with chronic bronchitis ([place])   . Depression   . Dysrhythmia    A fib  . Essential hypertension   . GERD (gastroesophageal reflux disease)   . History of adenomatous polyp of colon   . History of blood transfusion   . History of DVT of lower extremity   . History of hiatal hernia   . History of TIA (transient ischemic attack)    a. Head CT 6/15: small chronic lacunar infarct in thalamus  . Hyperlipidemia   . OA (osteoarthritis)   . Osteoporosis   . Peripheral neuropathy   . Pneumonia   . Pulmonary HTN ([place])    a. PASP on Echo in 9/12:  59 mmHg  . Renal insufficiency   . Sigmoid diverticulosis    s/p sigmoid colectomy  . Stroke ([place])    TIAs  . Wears dentures   .  Wears glasses     Social History   Socioeconomic History  . Marital status: Widowed    Spouse name: Not on file  . Number of children: Not on file  . Years of education: Not on file  . Highest education level: Not on file  Occupational History  . Not on file  Tobacco Use  . Smoking status: Former Smoker    Packs/day: 0.50    Years: 16.00    Pack years: 8.00    Types: Cigarettes    Start date: 22    Quit date: 07/16/1967    Years since quitting: 53.3  . Smokeless tobacco: Never Used  Vaping Use  . Vaping Use: Never used  Substance and Sexual Activity  . Alcohol use: No    Alcohol/week: 0.0 standard drinks  . Drug use: No  . Sexual activity: Not on file  Other Topics Concern  . Not on file  Social History Narrative   Married, 2 children.  As of November 2015 her husband has been living in a nursing home for tendon after years as he  suffers from multiple medical problems.   Patient does not drink alcohol nor does she smoke or chew tobacco products   Right handed   8th grade   1 cup daily   Social Determinants of Health   Financial Resource Strain: Low Risk   . Difficulty of Paying Living Expenses: Not hard at all  Food Insecurity: No Food Insecurity  . Worried About Charity fundraiser in the Last Year: Never true  . Ran Out of Food in the Last Year: Never true  Transportation Needs: No Transportation Needs  . Lack of Transportation (Medical): No  . Lack of Transportation (Non-Medical): No  Physical Activity: Inactive  . Days of Exercise per Week: 0 days  . Minutes of Exercise per Session: 0 min  Stress: No Stress Concern Present  . Feeling of Stress : Not at all  Social Connections: Moderately Isolated  . Frequency of Communication with Friends and Family: More than three times a week  . Frequency of Social Gatherings with Friends and Family: Once a week  . Attends Religious Services: 1 to 4 times per year  . Active Member of Clubs or Organizations: No  .  Attends Archivist Meetings: Never  . Marital Status: Widowed  Intimate Partner Violence: Not At Risk  . Fear of Current or Ex-Partner: No  . Emotionally Abused: No  . Physically Abused: No  . Sexually Abused: No    Past Surgical History:  Procedure Laterality Date  . ABDOMINAL HYSTERECTOMY    . ANTERIOR CERVICAL DECOMP/DISCECTOMY FUSION  01-31-2009   C5 -- 7  . APPENDECTOMY    . Bone spurs Bilateral    Feet  . CARDIOVASCULAR STRESS TEST  02-26-2011   Normal lexiscan no exercise study/  no ischemia/  normal LV function and wall motion, ef 67%  . CARPAL TUNNEL RELEASE Right   . CATARACT EXTRACTION W/ INTRAOCULAR LENS  IMPLANT, BILATERAL Bilateral   . CHOLECYSTECTOMY    . COLOSTOMY N/A 06/02/2014   Procedure: DIVERTING DESCENDING END COLOSTOMY;  Surgeon: Donnie Mesa, MD;  Location: [place];  Service: General;  Laterality: N/A;  . CORONARY ANGIOPLASTY WITH STENT PLACEMENT  11-04-2007  dr bensimhon   BMS x2 to RCA/  mild Non-obstructive disease LAD/  normal LVF  . DILATION AND CURETTAGE OF UTERUS    . ESOPHAGOGASTRODUODENOSCOPY N/A 10/28/2017   Procedure: ESOPHAGOGASTRODUODENOSCOPY (EGD);  Surgeon: Yetta Flock, MD;  Location[facility] ENDOSCOPY;  Service: Gastroenterology;  Laterality: N/A;  . EVALUATION UNDER ANESTHESIA WITH ANAL FISTULECTOMY N/A 10/13/2014   Procedure: ANAL EXAM UNDER ANESTHESIA ;  Surgeon: [name], MD;  Location: [facility];  Service: General;  Laterality: N/A;  . HAND SURGERY     Tendon repair  . LAPAROSCOPIC SIGMOID COLECTOMY N/A 11/24/2014   Procedure: SIGMOID COLECTOMY AND COLSOTOMY CLOSURE;  Surgeon: Donnie Mesa, MD;  Location: [place];  Service: General;  Laterality: N/A;  . LUMBAR LAMINECTOMY  10-29-2002,  1969   Left  L3 -- 4  decompression  . PARTIAL COLECTOMY N/A 06/02/2014   Procedure: RIGHT PARTIAL COLECTOMY ;  Surgeon: Donnie Mesa, MD;  Location: [place];  Service: General;  Laterality: N/A;  . PATELLECTOMY Right 09/14/2013   Procedure:  PATELLECTOMY;  Surgeon: Meredith Pel, MD;  Location: [place];  Service: Orthopedics;  Laterality: Right;  . PROCTOSCOPY N/A 10/13/2014   Procedure: RIDGE PROCTOSCOPY;  Surgeon: [name], MD;  Location: [facility];  Service: General;  Laterality: N/A;  . REVISION TOTAL KNEE ARTHROPLASTY  Bilateral right  04-02-2011/  left 1996 & 10-05-1999  . ROTATOR CUFF REPAIR Bilateral   . TONSILLECTOMY    . TOTAL KNEE ARTHROPLASTY Bilateral left 1994/  right 2000  . TOTAL KNEE REVISION Left 08/02/2015   Procedure: LEFT FEMORAL REVISION;  Surgeon: Gaynelle Arabian, MD;  Location: [facility];  Service: Orthopedics;  Laterality: Left;  . TRANSTHORACIC ECHOCARDIOGRAM  12-01-2009   Grade I diastolic dysfunction/  ef 60%/  moderate MR/  mild TR    Family History  Problem Relation Age of Onset  . Osteoarthritis Mother   . Heart failure Mother   . Colon cancer Maternal Uncle 65  . Heart Problems Father   . Cancer Sister 14       ? colon cancer   . Kidney cancer Brother 31  . Esophageal cancer Neg Hx   . Rectal cancer Neg Hx   . Stomach cancer Neg Hx   . Pancreatic cancer Neg Hx     No Known Allergies  Current Outpatient Medications on File Prior to Visit  Medication Sig Dispense Refill  . acetaminophen (TYLENOL) 500 MG tablet Take 1,000 mg by mouth every 6 (six) hours as needed for moderate pain.     Marland Kitchen albuterol (PROAIR HFA) 108 (90 Base) MCG/ACT inhaler Inhale 2 puffs into the lungs every 6 (six) hours as needed for wheezing or shortness of breath. 6.7 g 6  . allopurinol (ZYLOPRIM) 100 MG tablet TAKE ONE TABLET BY MOUTH ONCE DAILY (Patient taking differently: Take 100 mg by mouth daily.) 90 tablet 3  . Ascorbic Acid (VITAMIN C) 1000 MG tablet Take 1,000 mg by mouth daily.    . diclofenac Sodium (VOLTAREN) 1 % GEL Apply 2 g topically 4 (four) times daily. 150 g 0  . esomeprazole (NEXIUM) 40 MG capsule TAKE ONE CAPSULE BY MOUTH ONCE DAILY 90 capsule 1  . famotidine (PEPCID) 20 MG tablet TAKE ONE TABLET BY  MOUTH EVERYDAY AT BEDTIME 30 tablet 3  . ferrous sulfate 325 (65 FE) MG EC tablet Take 325 mg by mouth daily with breakfast.    . furosemide (LASIX) 20 MG tablet Take 2 tablets (40 mg total) by mouth daily. 180 tablet 3  . gabapentin (NEURONTIN) 600 MG tablet TAKE ONE TABLET BY MOUTH TWICE DAILY 180 tablet 1  . HYDROcodone-acetaminophen (NORCO/VICODIN) 5-325 MG tablet Take 1 tablet by mouth every 6 (six) hours as needed for severe pain. 30 tablet 0  . hydrocortisone (ANUSOL-HC) 2.5 % rectal cream Place 1 application rectally 2 (two) times daily. 30 g 1  . LORazepam (ATIVAN) 0.5 MG tablet TAKE ONE TABLET BY MOUTH EVERYDAY AT BEDTIME (Patient taking differently: Take 0.5 mg by mouth at bedtime.) 30 tablet 2  . losartan (COZAAR) 50 MG tablet Take 0.5 tablets (25 mg total) by mouth daily. 30 tablet 0  . metoprolol tartrate (LOPRESSOR) 50 MG tablet TAKE 1 AND 1/2 TABLETS BY MOUTH TWICE DAILY (Patient taking differently: Take 50 mg by mouth 2 (two) times daily.) 270 tablet 1  . nitroGLYCERIN (NITROSTAT) 0.4 MG SL tablet Place 1 tablet (0.4 mg total) under the tongue every 5 (five) minutes as needed for chest pain (x 3 pills). Reported on 09/01/2015 25 tablet 3  . sertraline (ZOLOFT) 50 MG tablet TAKE ONE TABLET BY MOUTH ONCE DAILY (Patient taking differently: Take 50 mg by mouth daily.) 90 tablet 1  . silver sulfADIAZINE (SILVADENE) 1 % cream Apply 1 application topically 2 (two) times daily.    Marland Kitchen Spacer/Aero-Holding Chambers (E-Z  SPACER) inhaler Use as instructed 1 each 2  . Tiotropium Bromide Monohydrate (SPIRIVA RESPIMAT) 2.5 MCG/ACT AERS Inhale 2 puffs into the lungs daily at 2 PM. 3 each 3  . Tiotropium Bromide Monohydrate (SPIRIVA RESPIMAT) 2.5 MCG/ACT AERS Inhale 2 puffs into the lungs daily. 1 each 0  . traZODone (DESYREL) 50 MG tablet TAKE ONE TABLET BY MOUTH EVERYDAY AT BEDTIME (Patient taking differently: Take 50 mg by mouth at bedtime.) 90 tablet 1  . triamcinolone cream (KENALOG) 0.1 % Apply 1  application topically 2 (two) times daily.      No current facility-administered medications on file prior to visit.    BP 118/80 (BP Location: Left Arm, Patient Position: Sitting, Cuff Size: Normal)   Pulse 85   Temp 98.2 F (36.8 C) (Oral)   Ht $R'5\' 3"'Ch$  (1.6 m)   Wt 188 lb (85.3 kg)   SpO2 95%   BMI 33.30 kg/m       Objective:   Physical Exam Vitals and nursing note reviewed.  Constitutional:      Appearance: Normal appearance.  Cardiovascular:     Rate and Rhythm: Normal rate and regular rhythm.     Pulses: Normal pulses.     Heart sounds: Normal heart sounds.  Pulmonary:     Effort: Pulmonary effort is normal.     Breath sounds: Normal breath sounds.  Abdominal:     General: Abdomen is flat.     Palpations: Abdomen is soft.  Musculoskeletal:        General: Swelling and tenderness present. Normal range of motion.  Skin:    General: Skin is warm and dry.     Capillary Refill: Capillary refill takes less than 2 seconds.     Comments: Redness, warmth, and swelling noted to DIP joint of right middle finger  Neurological:     General: No focal deficit present.     Mental Status: She is alert and oriented to person, place, and time.     Motor: Weakness present.     Gait: Gait abnormal.     Comments: Slow steady gait with walker   Psychiatric:        Mood and Affect: Mood normal.        Behavior: Behavior normal.        Thought Content: Thought content normal.        Judgment: Judgment normal.       Assessment & Plan:  1. Acute on chronic diastolic congestive heart failure [facility] - Reviewed hospital discharge notes, imaging, and labs. All questions answered to the best of my ability  - Advised follow up with Cardiology  - weakness and fatigue should improve with PT   2. Essential hypertension - Controlled. Continue to monitor   3. Chronic atrial fibrillation (HCC) - Follow up with Cardiology as directed - apixaban (ELIQUIS) 5 MG TABS tablet; Take 1 tablet (5 mg  total) by mouth 2 (two) times daily.  Dispense: 180 tablet; Refill: 3  4. Coronary artery disease of native artery of native heart with stable angina pectoris ([place]) - Continue with current plan   5. Chronic gout of right hand, unspecified cause  - predniSONE (DELTASONE) 20 MG tablet; Take 1 tablet (20 mg total) by mouth daily with breakfast.  Dispense: 10 tablet; Refill: 0  6. Pulmonary nodule - Follow up with pulmonary as directed  [name], NP

## 2020-11-27 ENCOUNTER — Telehealth: Payer: Self-pay | Admitting: Adult Health

## 2020-11-27 ENCOUNTER — Telehealth: Payer: Self-pay

## 2020-11-27 DIAGNOSIS — I482 Chronic atrial fibrillation, unspecified: Secondary | ICD-10-CM

## 2020-11-27 MED ORDER — ELIQUIS 5 MG PO TABS: 5.0000 mg | ORAL_TABLET | Freq: Two times a day (BID) | ORAL | 3 refills | Status: DC

## 2020-11-27 NOTE — Telephone Encounter (Signed)
[name] is calling and is requesting verbal orders for 1 week 6 for physical therapy, please advise. CB is [phone]

## 2020-11-27 NOTE — Telephone Encounter (Signed)
Okay 

## 2020-11-27 NOTE — Telephone Encounter (Signed)
-----   Message from Viona Gilmore, [facility] sent at 11/27/2020  2:55 PM EDT ----- Regarding: Eliquis refill Hi,  For some reason [facility] never received the Eliquis prescription that was sent over last week. Can you please resend it? I think it Kohrs have to do with the way it says sample in the prescription so that Varnell have to be fixed.  Thank you, Maddie

## 2020-11-28 NOTE — Telephone Encounter (Signed)
Verbal given 

## 2020-11-28 NOTE — Telephone Encounter (Signed)
Ok for verbal orders ?

## 2020-12-05 ENCOUNTER — Telehealth: Payer: Self-pay | Admitting: Pharmacist

## 2020-12-05 ENCOUNTER — Other Ambulatory Visit: Payer: Self-pay

## 2020-12-05 ENCOUNTER — Encounter: Payer: Self-pay | Admitting: Pulmonary Disease

## 2020-12-05 ENCOUNTER — Ambulatory Visit (INDEPENDENT_AMBULATORY_CARE_PROVIDER_SITE_OTHER): Payer: HMO

## 2020-12-05 ENCOUNTER — Ambulatory Visit: Payer: HMO | Admitting: Pulmonary Disease

## 2020-12-05 VITALS — BP 118/68 | HR 79 | Temp 97.3°F | Ht 63.5 in | Wt 184.8 lb

## 2020-12-05 DIAGNOSIS — K449 Diaphragmatic hernia without obstruction or gangrene: Secondary | ICD-10-CM | POA: Diagnosis not present

## 2020-12-05 DIAGNOSIS — J9811 Atelectasis: Secondary | ICD-10-CM | POA: Diagnosis not present

## 2020-12-05 DIAGNOSIS — J439 Emphysema, unspecified: Secondary | ICD-10-CM | POA: Diagnosis not present

## 2020-12-05 DIAGNOSIS — R059 Cough, unspecified: Secondary | ICD-10-CM | POA: Diagnosis not present

## 2020-12-05 DIAGNOSIS — R300 Dysuria: Secondary | ICD-10-CM | POA: Diagnosis not present

## 2020-12-05 LAB — URINALYSIS, ROUTINE W REFLEX MICROSCOPIC
Bilirubin Urine: NEGATIVE
Hgb urine dipstick: NEGATIVE
Ketones, ur: NEGATIVE
Nitrite: NEGATIVE
Specific Gravity, Urine: 1.015 (ref 1.000–1.030)
Urine Glucose: NEGATIVE
Urobilinogen, UA: 0.2 (ref 0.0–1.0)
pH: 6 (ref 5.0–8.0)

## 2020-12-05 MED ORDER — TRELEGY ELLIPTA 100-62.5-25 MCG/INH IN AEPB: 1.0000 | INHALATION_SPRAY | Freq: Every day | RESPIRATORY_TRACT | 3 refills | Status: DC

## 2020-12-05 MED ORDER — TRELEGY ELLIPTA 100-62.5-25 MCG/INH IN AEPB: 1.0000 | INHALATION_SPRAY | Freq: Every day | RESPIRATORY_TRACT | 0 refills | Status: DC

## 2020-12-05 NOTE — Patient Instructions (Signed)
Emphysema -uncontrolled, not an active exacerbation --STOP Spiriva  --START Trelegy. Sample and three month supply ordered --CONTINUE Albuterol as needed --CXR today  Dysuria --ORDER UA  Follow-up with me in 3 months

## 2020-12-05 NOTE — Chronic Care Management (AMB) (Signed)
Chronic Care Management Pharmacy Assistant   Name: [name]  MRN: [id number] DOB: 07/25/1929  Reason for Encounter: Medication Review-Medication Coordination Call   Recent office visits:  . 05.11.2022 Whitney Peng, NP patient present for a hospital follow up  Recent consult visits:  . 05.24.2022 Whitney Seeds, MD Pulmonary Disease patient seen for follow up. o Medication started TRELEGY ELLIPTA) 100-62.5-25 MCG/INH AEPB-patient was given a two-week trial.  Hospital visits:  Medication Reconciliation was completed by comparing discharge summary, patient's EMR and Pharmacy list, and upon discussion with patient.  Admitted to the hospital on 04.29.2022 due to CHF exacerbation . Discharge date was 05.02.2022. Discharged from [place]?Medications Started at [facility] Discharge:?? -started the following medication . Diclofenac Sodium1%:  apply 2 grams topically four times a day . Hydrocodone-acetaminophen 5-325 mg: take one tablet by mouth every six hours as needed for severe pain  Medication Changes at Hospital Discharge: . Losartan 50 mg: take 0.5 tablets by mouth daily  Medications Discontinued at Hospital Discharge: . Doxycycline 100 MG capsule (discontinue)  Medications that remain the same after Hospital Discharge:??  -All other medications will remain the same.    Medications: Outpatient Encounter Medications as of 12/05/2020  Medication Sig  . acetaminophen (TYLENOL) 500 MG tablet Take 1,000 mg by mouth every 6 (six) hours as needed for moderate pain.   Marland Kitchen albuterol (PROAIR HFA) 108 (90 Base) MCG/ACT inhaler Inhale 2 puffs into the lungs every 6 (six) hours as needed for wheezing or shortness of breath.  . allopurinol (ZYLOPRIM) 100 MG tablet TAKE ONE TABLET BY MOUTH ONCE DAILY (Patient taking differently: Take 100 mg by mouth daily.)  . apixaban (ELIQUIS) 5 MG TABS tablet Take 1 tablet (5 mg total) by mouth 2 (two) times daily.  .  Ascorbic Acid (VITAMIN C) 1000 MG tablet Take 1,000 mg by mouth daily.  . diclofenac Sodium (VOLTAREN) 1 % GEL Apply 2 g topically 4 (four) times daily.  Marland Kitchen esomeprazole (NEXIUM) 40 MG capsule TAKE ONE CAPSULE BY MOUTH ONCE DAILY  . famotidine (PEPCID) 20 MG tablet TAKE ONE TABLET BY MOUTH EVERYDAY AT BEDTIME  . ferrous sulfate 325 (65 FE) MG EC tablet Take 325 mg by mouth daily with breakfast.  . Fluticasone-Umeclidin-Vilant (TRELEGY ELLIPTA) 100-62.5-25 MCG/INH AEPB Inhale 1 puff into the lungs daily.  . Fluticasone-Umeclidin-Vilant (TRELEGY ELLIPTA) 100-62.5-25 MCG/INH AEPB Inhale 1 puff into the lungs daily.  . furosemide (LASIX) 20 MG tablet Take 2 tablets (40 mg total) by mouth daily.  Marland Kitchen gabapentin (NEURONTIN) 600 MG tablet TAKE ONE TABLET BY MOUTH TWICE DAILY  . HYDROcodone-acetaminophen (NORCO/VICODIN) 5-325 MG tablet Take 1 tablet by mouth every 6 (six) hours as needed for severe pain.  . hydrocortisone (ANUSOL-HC) 2.5 % rectal cream Place 1 application rectally 2 (two) times daily.  Marland Kitchen LORazepam (ATIVAN) 0.5 MG tablet TAKE ONE TABLET BY MOUTH EVERYDAY AT BEDTIME (Patient taking differently: Take 0.5 mg by mouth at bedtime.)  . losartan (COZAAR) 50 MG tablet Take 0.5 tablets (25 mg total) by mouth daily.  . metoprolol tartrate (LOPRESSOR) 50 MG tablet TAKE 1 AND 1/2 TABLETS BY MOUTH TWICE DAILY (Patient taking differently: Take 50 mg by mouth 2 (two) times daily.)  . nitroGLYCERIN (NITROSTAT) 0.4 MG SL tablet Place 1 tablet (0.4 mg total) under the tongue every 5 (five) minutes as needed for chest pain (x 3 pills). Reported on 09/01/2015  . predniSONE (DELTASONE) 20 MG tablet Take 1 tablet (20  mg total) by mouth daily with breakfast. (Patient not taking: Reported on 12/05/2020)  . sertraline (ZOLOFT) 50 MG tablet TAKE ONE TABLET BY MOUTH ONCE DAILY (Patient taking differently: Take 50 mg by mouth daily.)  . silver sulfADIAZINE (SILVADENE) 1 % cream Apply 1 application topically 2 (two) times  daily.  Marland Kitchen Spacer/Aero-Holding Chambers (E-Z SPACER) inhaler Use as instructed  . Tiotropium Bromide Monohydrate (SPIRIVA RESPIMAT) 2.5 MCG/ACT AERS Inhale 2 puffs into the lungs daily at 2 PM.  . Tiotropium Bromide Monohydrate (SPIRIVA RESPIMAT) 2.5 MCG/ACT AERS Inhale 2 puffs into the lungs daily.  . traZODone (DESYREL) 50 MG tablet TAKE ONE TABLET BY MOUTH EVERYDAY AT BEDTIME (Patient taking differently: Take 50 mg by mouth at bedtime.)  . triamcinolone cream (KENALOG) 0.1 % Apply 1 application topically 2 (two) times daily.    No facility-administered encounter medications on file as of 12/05/2020.   Reviewed chart for medication changes ahead of medication coordination call.  BP Readings from Last 3 Encounters:  12/05/20 118/68  11/22/20 118/80  11/13/20 137/86    Lab Results  Component Value Date   HGBA1C 5.9 (H) 05/31/2014     Patient obtains medications through Adherence Packaging  90 Days  Last adherence delivery included:  Marland Kitchen Allopurinol 100 mg: one tablet by mouth daily . Famotidine 20 mg: one tablet by mouth at bedtime . Furosemide 20 mg: two tablets by mouth daily . Gabapentin 600 mg: one tablet by mouth 2 (two) times daily . Trazodone 50 mg: one tablet by mouth at bedtime . Sertraline 50 mg: one tablet by mouth daily . Esomeprazole 40 mg: one capsule by mouth daily . Lorazepam 0.5 mg one tablet by mouth at bedtime . Metoprolol Tartrate 50 mg: one and half (1.5) tablet twice a day  Patient declined the following medications last month due to PRN use/additional supply on hand. [name] Sulfadiazine 1% topical cream (PRN Use) . Apixaban (ELIQUIS) 5 MG TABS tablet: one tablet by month twice daily (90 DS filled 08/10/2020) . Spiriva Spr 2.5 mcg Inhale two puff my mouth at 2 pm daily. (90 DS filled 08/24/2020) . Albuterol (PROAIR HFA) 108 (90 Base) MCG/ACT inhaler: two puff every 6 hours as needed. (PRN Use) . Triamcinolone cream 0.1% apply twice a day (PRN Use) . Losartan  (COZAAR) 50 MG: one tablet by mouth daily (30 DS filled 10/07/2020)  I spoke with the patient and reviewed medications. There are no changes in medications currently. The patient declined these medications this month due to PRN use/additional supply on hand. The patient is taking the following medications: 90 days supply was sent out on 04.04.2022 . Allopurinol 100 mg: one tablet by mouth daily . Famotidine 20 mg: one tablet by mouth at bedtime . Furosemide 20 mg: two tablets by mouth daily . Gabapentin 600 mg: one tablet by mouth 2 (two) times daily . Trazodone 50 mg: one tablet by mouth at bedtime . Sertraline 50 mg: one tablet by mouth daily . Esomeprazole 40 mg: one capsule by mouth daily . Lorazepam 0.5 mg one tablet by mouth at bedtime . Metoprolol Tartrate 50 mg: one and half (1.5) tablet twice a day . Silver Sulfadiazine 1% topical cream (PRN Use) . Apixaban (ELIQUIS) 5 MG TABS tablet: one tablet by month twice daily  . Spiriva Spr 2.5 mcg Inhale two puff my mouth at 2 pm daily. ( . Albuterol (PROAIR HFA) 108 (90 Base) MCG/ACT inhaler: two puff every 6 hours as needed. (PRN Use) . Triamcinolone cream  0.1% apply twice a day (PRN Use) . Losartan (COZAAR) 50 MG: one/ half (0.5) tablet by mouth daily  . Diclofenac Sodium1%:  apply 2 grams topically four times a day . Hydrocodone-acetaminophen 5-325 mg: take one tablet by mouth every six hours as needed for severe pain . Fluticasone-Umeclidin-Vilant (TRELEGY ELLIPTA) 100-62.5-25 MCG/INH AEPB   She currently does not need refills.  * One week follow-up with patient on (TRELEGY ELLIPTA) 100-62.5-25 MCG/INH AEPB Star Rating Drugs:  Dispensed Quantity Pharmacy  Losartan 50 mg 05.02.2022 30 [facility]    Amilia Revonda Standard, [place] Pharmacist Assistant [phone]

## 2020-12-05 NOTE — Progress Notes (Addendum)
Synopsis: Referred in 04/2018 for lung nodule  Subjective:   PATIENT ID: [name] GENDER: female DOB: [id number]-Mckeen-1931, MRN: [id number]   HPI  Chief Complaint  Patient presents with  . Follow-up    Reports increased use of albuterol to help with cough and breathing. Recently in the hospital for congestive heart failure.    Ms. [name] is a 87 year old female former smoker, atrial fibrillation, history colon cancer status post resection, CAD status post stent and chronic diastolic heart failure who presents for follow-up.  Since our last visit, she was restarted on Spiriva due to intolerance to Anoro and [employer]. She was also treated for COPD exacerbation with steroids. She was also hospitalized for heart failure exacerbation one month ago. Discharge summary on 11/13/20 reviewed. She has improved since her hospitalization however she has been using her albuterol daily for cough and shortness of breath. She has been wheezing with activity. She has been compliant with Spiriva and taking Lasix 40 mg daily. She is urinating well with medications. On Saturday she was using the restroom every hour and having dysuria.  Social history. Former smoker. 7.5 pack-years. Quit in 1969  I have personally reviewed patient's past medical/family/social history/allergies/current medications.  Past Medical History:  Diagnosis Date  . Adenocarcinoma, colon [facility]) Oncologist-- Dr Truitt Merle   Multifocal (2) Colon cancer @ ileocecal valve and ascending ( [id number]), Stage IIIB, grade I, MMR normal , 2 of 16 +lymph nodes, negative surgical margins---  06-02-2014  Right hemicolectomy w/ colostomy  . Allergy   . Anxiety   . Asthma    inhaler "sometimes"  . CAD (coronary artery disease)    a. LHC 4/09: pLAD 40, mDx 70-75, pLCx 30, mLCx 50, mRCA 90, EF 60% >> PCI: BMS x2 to RCA;  b. Myoview 8/12: normal  . Cataract    bil cateracts removed  . Chronic anemia   . Chronic diastolic CHF (congestive heart failure)  (HCC)    a. Echo 912 - Mild LVH, EF 55-60%, no RWMA, Gr 1 DD, mild MR, mild LAE, mild RAE, PASP 59 mmHg (mod to severe pulmo HTN)  . Clotting disorder (HCC)    hx of dvt, tia on eliquis  . Colostomy in place [facility])    s/p colostomy takedown 5/16  . Colovaginal fistula    s/p colostomy >> colostomy takedown 5/16  . COPD with chronic bronchitis ([place])   . Depression   . Dysrhythmia    A fib  . Essential hypertension   . GERD (gastroesophageal reflux disease)   . History of adenomatous polyp of colon   . History of blood transfusion   . History of DVT of lower extremity   . History of hiatal hernia   . History of TIA (transient ischemic attack)    a. Head CT 6/15: small chronic lacunar infarct in thalamus  . Hyperlipidemia   . OA (osteoarthritis)   . Osteoporosis   . Peripheral neuropathy   . Pneumonia   . Pulmonary HTN ([place])    a. PASP on Echo in 9/12:  59 mmHg  . Renal insufficiency   . Sigmoid diverticulosis    s/p sigmoid colectomy  . Stroke ([place])    TIAs  . Wears dentures   . Wears glasses     No Known Allergies   Outpatient Medications Prior to Visit  Medication Sig Dispense Refill  . acetaminophen (TYLENOL) 500 MG tablet Take 1,000 mg by mouth every 6 (six) hours as needed for moderate  pain.     . albuterol (PROAIR HFA) 108 (90 Base) MCG/ACT inhaler Inhale 2 puffs into the lungs every 6 (six) hours as needed for wheezing or shortness of breath. 6.7 g 6  . allopurinol (ZYLOPRIM) 100 MG tablet TAKE ONE TABLET BY MOUTH ONCE DAILY (Patient taking differently: Take 100 mg by mouth daily.) 90 tablet 3  . apixaban (ELIQUIS) 5 MG TABS tablet Take 1 tablet (5 mg total) by mouth 2 (two) times daily. 180 tablet 3  . Ascorbic Acid (VITAMIN C) 1000 MG tablet Take 1,000 mg by mouth daily.    . diclofenac Sodium (VOLTAREN) 1 % GEL Apply 2 g topically 4 (four) times daily. 150 g 0  . esomeprazole (NEXIUM) 40 MG capsule TAKE ONE CAPSULE BY MOUTH ONCE DAILY 90 capsule 1  . famotidine  (PEPCID) 20 MG tablet TAKE ONE TABLET BY MOUTH EVERYDAY AT BEDTIME 30 tablet 3  . ferrous sulfate 325 (65 FE) MG EC tablet Take 325 mg by mouth daily with breakfast.    . furosemide (LASIX) 20 MG tablet Take 2 tablets (40 mg total) by mouth daily. 180 tablet 3  . gabapentin (NEURONTIN) 600 MG tablet TAKE ONE TABLET BY MOUTH TWICE DAILY 180 tablet 1  . HYDROcodone-acetaminophen (NORCO/VICODIN) 5-325 MG tablet Take 1 tablet by mouth every 6 (six) hours as needed for severe pain. 30 tablet 0  . hydrocortisone (ANUSOL-HC) 2.5 % rectal cream Place 1 application rectally 2 (two) times daily. 30 g 1  . LORazepam (ATIVAN) 0.5 MG tablet TAKE ONE TABLET BY MOUTH EVERYDAY AT BEDTIME (Patient taking differently: Take 0.5 mg by mouth at bedtime.) 30 tablet 2  . losartan (COZAAR) 50 MG tablet Take 0.5 tablets (25 mg total) by mouth daily. 30 tablet 0  . metoprolol tartrate (LOPRESSOR) 50 MG tablet TAKE 1 AND 1/2 TABLETS BY MOUTH TWICE DAILY (Patient taking differently: Take 50 mg by mouth 2 (two) times daily.) 270 tablet 1  . nitroGLYCERIN (NITROSTAT) 0.4 MG SL tablet Place 1 tablet (0.4 mg total) under the tongue every 5 (five) minutes as needed for chest pain (x 3 pills). Reported on 09/01/2015 25 tablet 3  . predniSONE (DELTASONE) 20 MG tablet Take 1 tablet (20 mg total) by mouth daily with breakfast. 10 tablet 0  . sertraline (ZOLOFT) 50 MG tablet TAKE ONE TABLET BY MOUTH ONCE DAILY (Patient taking differently: Take 50 mg by mouth daily.) 90 tablet 1  . silver sulfADIAZINE (SILVADENE) 1 % cream Apply 1 application topically 2 (two) times daily.    Marland Kitchen Spacer/Aero-Holding Chambers (E-Z SPACER) inhaler Use as instructed 1 each 2  . Tiotropium Bromide Monohydrate (SPIRIVA RESPIMAT) 2.5 MCG/ACT AERS Inhale 2 puffs into the lungs daily at 2 PM. 3 each 3  . Tiotropium Bromide Monohydrate (SPIRIVA RESPIMAT) 2.5 MCG/ACT AERS Inhale 2 puffs into the lungs daily. 1 each 0  . traZODone (DESYREL) 50 MG tablet TAKE ONE TABLET  BY MOUTH EVERYDAY AT BEDTIME (Patient taking differently: Take 50 mg by mouth at bedtime.) 90 tablet 1  . triamcinolone cream (KENALOG) 0.1 % Apply 1 application topically 2 (two) times daily.      No facility-administered medications prior to visit.    Review of Systems  Constitutional: Negative for chills, diaphoresis, fever, malaise/fatigue and weight loss.  HENT: Negative for congestion.   Respiratory: Positive for cough, shortness of breath and wheezing. Negative for hemoptysis and sputum production.   Cardiovascular: Negative for chest pain, palpitations and leg swelling.    Objective:  Vitals:   12/05/20 0919  BP: 118/68  Pulse: 79  Temp: (!) 97.3 F (36.3 C)  TempSrc: Temporal  SpO2: 100%  Weight: 184 lb 12.8 oz (83.8 kg)  Height: 5' 3.5" (1.613 m)   Physical Exam: General: Elderly and frail-appearing, no acute distress HENT: [place], AT Eyes: EOMI, no scleral icterus Respiratory: Clear to auscultation bilaterally.  No crackles, wheezing or rales Cardiovascular: Irregular rate and rhythm, -M/R/G, no JVD Extremities:-Edema,-tenderness Neuro: AAO x4, CNII-XII grossly intact Skin: Intact, no rashes or bruising Psych: Normal mood, normal affect, appropriate content  Data Reviewed:  Chest imaging CT Chest 04/06/18 - stable 2 mm nodular opacity in the right lower lobe. No pleural effusion evident. There is mild underlying centrilobular emphysematous change. CT Chest 12/18/18 - No lymphadenopathy, centrilobular emphysema, stable 2 mm nodular opacity in superior segment right lower lobe CT chest 03/23/2020-no hilar or mediastinal lymphadenopathy.  Unchanged 2 mm subpleural right lower lobe nodule.  Emphysema.  CTA 11/10/20 - No PE. Mosaic attenuation. Cardiomegaly. 1.5 cm mediastinal RML nodule, [place] be reactive CXR 10/21/20 - Minimal bibasilar atelectasis. No infiltrate, effusion or edema  PFT None on file  Echo TTE 10/28/17 - ejection fraction was in the range of 55% to 60%.  Wall motion was normal; Mildly to moderately calcified annulus. moderate regurgitation. PA peak pressure: 50 mm Hg (S). The right ventricular systolic pressure was increased consistent with moderate pulmonary hypertension.  Labs BMET    Component Value Date/Time   NA 140 11/13/2020 0433   NA 143 06/06/2020 1128   NA 137 02/17/2017 0932   K 3.7 11/13/2020 0433   K 4.6 02/17/2017 0932   CL 98 11/13/2020 0433   CO2 31 11/13/2020 0433   CO2 27 02/17/2017 0932   GLUCOSE 95 11/13/2020 0433   GLUCOSE 102 02/17/2017 0932   BUN 37 (H) 11/13/2020 0433   BUN 48 (H) 06/06/2020 1128   BUN 26.7 (H) 02/17/2017 0932   CREATININE 1.30 (H) 11/13/2020 0433   CREATININE 1.3 (H) 02/17/2017 0932   CALCIUM 8.9 11/13/2020 0433   CALCIUM 9.5 02/17/2017 0932   GFRNONAA 40 (L) 11/13/2020 0433   GFRAA 37 (L) 06/06/2020 1128   Imaging, labs and test noted above have been reviewed independently by me.    Assessment & Plan:   Discussion: 87 year old female former smoker with atrial fibrillation, hx colon cancer s/p resection, CAD s/p stent, chronic diastolic heart failure who presents for emphysema follow-up. Previously failed Anoro and Breztri as outpatient however states she tolerated powdered inhaler while inpatient. Remains symptomatic on LAMA. Not in active exacerbation. Will step up therapy to LAMA/LABA/ICS. CXR today reviewed with no evidence of infection or volume overload. She also has symptoms classic for cystitis. UA +leuks and bacteria. Hx of Pseudomonas aeruginosa UTI, no resistance documented on 08/03/19.  Emphysema -uncontrolled, not an active exacerbation --STOP Spiriva  --START Trelegy. Sample and three month supply ordered --CONTINUE Albuterol as needed  Dysuria --ORDER UA. Reviewed --Cefpodoxime 100 mg BID ordered. Will call patient with recommendations  Immunization History  Administered Date(s) Administered  . Fluad Quad(high Dose 65+) 04/05/2019  . Influenza Split 06/21/2011,  04/21/2012  . Influenza Whole 05/19/2007, 04/05/2008, 04/26/2010  . Influenza, High Dose Seasonal PF 05/17/2015, 05/22/2016, 04/11/2017, 04/01/2018  . Influenza,inj,Quad PF,6+ Mos 03/10/2013  . Influenza-Unspecified 04/14/2014, 04/29/2020  . PFIZER(Purple Top)SARS-COV-2 Vaccination 02/02/2020, 02/23/2020  . PPD Test 06/10/2014, 06/24/2014, 08/18/2015  . Pneumococcal Conjugate-13 05/17/2015  . Pneumococcal Polysaccharide-23 02/10/2013  . Tdap 11/19/2010  Orders Placed This Encounter  Procedures  . DG Chest 2 View    Standing Status:   Future    Number of Occurrences:   1    Standing Expiration Date:   12/05/2021    Order Specific Question:   Reason for Exam (SYMPTOM  OR DIAGNOSIS REQUIRED)    Answer:   cough    Order Specific Question:   Preferred imaging location?    Answer:   Internal  . Urinalysis    Standing Status:   Future    Number of Occurrences:   1    Standing Expiration Date:   12/05/2021  . Urinalysis, Routine w reflex microscopic   Meds ordered this encounter  Medications  . Fluticasone-Umeclidin-Vilant (TRELEGY ELLIPTA) 100-62.5-25 MCG/INH AEPB    Sig: Inhale 1 puff into the lungs daily.    Dispense:  2 each    Refill:  0    Order Specific Question:   Lot Number?    Answer:   [id number]    Order Specific Question:   Expiration Date?    Answer:   06/14/2022    Order Specific Question:   Manufacturer?    Answer:   [employer] [[id number]]    Order Specific Question:   Quantity    Answer:   2  . Fluticasone-Umeclidin-Vilant (TRELEGY ELLIPTA) 100-62.5-25 MCG/INH AEPB    Sig: Inhale 1 puff into the lungs daily.    Dispense:  60 each    Refill:  3   Return in about 3 months (around 03/07/2021).   I have spent a total time of 33-minutes on the day of the appointment reviewing prior documentation, coordinating care and discussing medical diagnosis and plan with the patient/family. Imaging, labs and tests included in this note have been reviewed and interpreted independently by  me.   Kalim Kissel Rodman Pickle, MD [place] [facility] 12/05/2020 8:27 AM

## 2020-12-06 ENCOUNTER — Encounter: Payer: Self-pay | Admitting: Pulmonary Disease

## 2020-12-06 ENCOUNTER — Telehealth: Payer: Self-pay | Admitting: Pulmonary Disease

## 2020-12-06 MED ORDER — CEFPODOXIME PROXETIL 100 MG PO TABS: 100.0000 mg | ORAL_TABLET | Freq: Two times a day (BID) | ORAL | 0 refills | Status: AC

## 2020-12-06 NOTE — Addendum Note (Signed)
Addended by: Rodman Pickle on: 12/06/2020 01:03 PM   Modules accepted: Orders

## 2020-12-06 NOTE — Telephone Encounter (Signed)
Called and spoke with patient to verify that she wanted Vantin 100 mg sent into [facility]. Patient states she did want it called in due to them being able to delivery medication.  [place] to cancel prescription. The pharmacist [place] states patient called earlier to get prescription transferred. The pharmacist states that they did transfer the prescription over to [facility] pharmacy.  Nothing further needed at this time.

## 2020-12-06 NOTE — Telephone Encounter (Signed)
Spoke with pt informed her of Dr. Megan Salon recommendations. Pt stated understanding. Nothing further needed at this time.

## 2020-12-06 NOTE — Telephone Encounter (Signed)
ATC LVMTCB x 1  

## 2020-12-06 NOTE — Telephone Encounter (Signed)
Please contact patient with the following message:  Urinalysis and urine culture has been reviewed. Hx of PA however pansensitive. Recommend cefpodoxime 100 mg BID for seven days for treatment of UTI. If symptoms persist, please contact our office or PCP for further evaluation and/or work up (ex. Urine culture)  Assessment/Plan Acute simple cystitis --Cefpodoxime 100 mg BID x 7 days (has already been ordered by me)  Rodman Pickle, M.D. [facility] 12/06/2020 1:12 PM

## 2020-12-13 ENCOUNTER — Telehealth: Payer: Self-pay | Admitting: Pulmonary Disease

## 2020-12-13 MED ORDER — ANORO ELLIPTA 62.5-25 MCG/INH IN AEPB: 1.0000 | INHALATION_SPRAY | Freq: Every day | RESPIRATORY_TRACT | 3 refills | Status: DC

## 2020-12-13 NOTE — Telephone Encounter (Signed)
Patient is aware of below message and voiced her understanding. Rx has been sent to preferred pharmacy. Nothing further needed at this time.

## 2020-12-13 NOTE — Telephone Encounter (Signed)
Staff, please contact patient with following message:  Advise to stop Trelegy due to mouth soreness/burning. Will order Anoro - 3 month supply. This does not have any steroid. Take per instructions.  Rodman Pickle, M.D. [facility] 12/13/2020 1:45 PM

## 2020-12-13 NOTE — Telephone Encounter (Signed)
Called and spoke with Patient.  Patient stated she is having burning, redness on her gums.  Patient is using Trelegy inhaler.  Advised and explained importance of rinsing mouth after using Trelegy.  Patient stated she uses Trelegy in the morning, rinses mouth, before putting dentures in her mouth. Patient requested any prescription to be sent to [facility].   Message routed to [employer] to advise  12/05/20-LOV  Instructions    Return in about 3 months (around 03/07/2021). Emphysema -uncontrolled, not an active exacerbation --STOP Spiriva  --START Trelegy. Sample and three month supply ordered --CONTINUE Albuterol as needed --CXR today  Dysuria --ORDER UA  Follow-up with me in 3 months

## 2020-12-14 DIAGNOSIS — H353131 Nonexudative age-related macular degeneration, bilateral, early dry stage: Secondary | ICD-10-CM | POA: Diagnosis not present

## 2020-12-14 DIAGNOSIS — Z961 Presence of intraocular lens: Secondary | ICD-10-CM | POA: Diagnosis not present

## 2020-12-14 DIAGNOSIS — H5022 Vertical strabismus, left eye: Secondary | ICD-10-CM | POA: Diagnosis not present

## 2020-12-14 DIAGNOSIS — H04123 Dry eye syndrome of bilateral lacrimal glands: Secondary | ICD-10-CM | POA: Diagnosis not present

## 2020-12-14 DIAGNOSIS — H53433 Sector or arcuate defects, bilateral: Secondary | ICD-10-CM | POA: Diagnosis not present

## 2020-12-15 ENCOUNTER — Telehealth: Payer: Self-pay | Admitting: Pharmacist

## 2020-12-15 NOTE — Chronic Care Management (AMB) (Signed)
Chronic Care Management Pharmacy Assistant   Name: [name]  MRN: [id number] DOB: 1930/05/14   Reason for Encounter: Follow-up on New Medication  Received call from patient regarding medication management via [facility].  Patient requested an acute fill for Umeclidinium-vilanterol Jearl Klinefelter ELLIPTA) 62.5-25 MCG/INH AEPB    to be delivered: 12/15/2020 Pharmacy needs refills? No  Confirmed delivery date of 12/15/2020, advised patient that pharmacy will contact them the morning of delivery.   Medications: Outpatient Encounter Medications as of 12/15/2020  Medication Sig  . acetaminophen (TYLENOL) 500 MG tablet Take 1,000 mg by mouth every 6 (six) hours as needed for moderate pain.   Marland Kitchen albuterol (PROAIR HFA) 108 (90 Base) MCG/ACT inhaler Inhale 2 puffs into the lungs every 6 (six) hours as needed for wheezing or shortness of breath.  . allopurinol (ZYLOPRIM) 100 MG tablet TAKE ONE TABLET BY MOUTH ONCE DAILY (Patient taking differently: Take 100 mg by mouth daily.)  . apixaban (ELIQUIS) 5 MG TABS tablet Take 1 tablet (5 mg total) by mouth 2 (two) times daily.  . Ascorbic Acid (VITAMIN C) 1000 MG tablet Take 1,000 mg by mouth daily.  . diclofenac Sodium (VOLTAREN) 1 % GEL Apply 2 g topically 4 (four) times daily.  Marland Kitchen esomeprazole (NEXIUM) 40 MG capsule TAKE ONE CAPSULE BY MOUTH ONCE DAILY  . famotidine (PEPCID) 20 MG tablet TAKE ONE TABLET BY MOUTH EVERYDAY AT BEDTIME  . ferrous sulfate 325 (65 FE) MG EC tablet Take 325 mg by mouth daily with breakfast.  . furosemide (LASIX) 20 MG tablet Take 2 tablets (40 mg total) by mouth daily.  Marland Kitchen gabapentin (NEURONTIN) 600 MG tablet TAKE ONE TABLET BY MOUTH TWICE DAILY  . HYDROcodone-acetaminophen (NORCO/VICODIN) 5-325 MG tablet Take 1 tablet by mouth every 6 (six) hours as needed for severe pain.  . hydrocortisone (ANUSOL-HC) 2.5 % rectal cream Place 1 application rectally 2 (two) times daily.  Marland Kitchen LORazepam (ATIVAN) 0.5 MG tablet TAKE ONE  TABLET BY MOUTH EVERYDAY AT BEDTIME (Patient taking differently: Take 0.5 mg by mouth at bedtime.)  . losartan (COZAAR) 50 MG tablet Take 0.5 tablets (25 mg total) by mouth daily.  . metoprolol tartrate (LOPRESSOR) 50 MG tablet TAKE 1 AND 1/2 TABLETS BY MOUTH TWICE DAILY (Patient taking differently: Take 50 mg by mouth 2 (two) times daily.)  . nitroGLYCERIN (NITROSTAT) 0.4 MG SL tablet Place 1 tablet (0.4 mg total) under the tongue every 5 (five) minutes as needed for chest pain (x 3 pills). Reported on 09/01/2015  . predniSONE (DELTASONE) 20 MG tablet Take 1 tablet (20 mg total) by mouth daily with breakfast. (Patient not taking: Reported on 12/05/2020)  . sertraline (ZOLOFT) 50 MG tablet TAKE ONE TABLET BY MOUTH ONCE DAILY (Patient taking differently: Take 50 mg by mouth daily.)  . silver sulfADIAZINE (SILVADENE) 1 % cream Apply 1 application topically 2 (two) times daily.  Marland Kitchen Spacer/Aero-Holding Chambers (E-Z SPACER) inhaler Use as instructed  . Tiotropium Bromide Monohydrate (SPIRIVA RESPIMAT) 2.5 MCG/ACT AERS Inhale 2 puffs into the lungs daily at 2 PM.  . Tiotropium Bromide Monohydrate (SPIRIVA RESPIMAT) 2.5 MCG/ACT AERS Inhale 2 puffs into the lungs daily.  . traZODone (DESYREL) 50 MG tablet TAKE ONE TABLET BY MOUTH EVERYDAY AT BEDTIME (Patient taking differently: Take 50 mg by mouth at bedtime.)  . triamcinolone cream (KENALOG) 0.1 % Apply 1 application topically 2 (two) times daily.   Marland Kitchen umeclidinium-vilanterol (ANORO ELLIPTA) 62.5-25 MCG/INH AEPB Inhale 1 puff into the lungs daily.  No facility-administered encounter medications on file as of 12/15/2020.   I spoke with the patient and discussed medication adherence. She stated the inhalers that she started on [place] the 24th Trelegy had been called to her problems. She has had an outbreak and burns inside of her mouth. She has contacted her pulmonary doctor and they switched her back to an Anoro inhaler. She stated that she has stopped using the  inhalers for two days and is starting to feel better. There have been no other changes to her medications. She is not experiencing any other side effects from her medications. There have been no emergency department or urgent care visits since her last CPP or PCP visit. She is a part of [facility].  Star Rating Drugs: Medication Dispensed  Quantity Pharmacy  Losartan 50 mg 05.02.2022 30 [facility]    Amilia Revonda Standard, [place] Pharmacist Assistant [phone]

## 2020-12-18 ENCOUNTER — Emergency Department (HOSPITAL_BASED_OUTPATIENT_CLINIC_OR_DEPARTMENT_OTHER)
Admission: EM | Admit: 2020-12-18 | Discharge: 2020-12-18 | Disposition: A | Discharge status: Home / Self Care | Payer: HMO | Attending: Emergency Medicine | Admitting: Emergency Medicine

## 2020-12-18 ENCOUNTER — Other Ambulatory Visit: Payer: Self-pay

## 2020-12-18 ENCOUNTER — Encounter (HOSPITAL_BASED_OUTPATIENT_CLINIC_OR_DEPARTMENT_OTHER): Payer: Self-pay | Admitting: *Deleted

## 2020-12-18 ENCOUNTER — Emergency Department (HOSPITAL_BASED_OUTPATIENT_CLINIC_OR_DEPARTMENT_OTHER): Payer: HMO | Admitting: Radiology

## 2020-12-18 DIAGNOSIS — Y93H2 Activity, gardening and landscaping: Secondary | ICD-10-CM | POA: Diagnosis not present

## 2020-12-18 DIAGNOSIS — Z23 Encounter for immunization: Secondary | ICD-10-CM | POA: Diagnosis not present

## 2020-12-18 DIAGNOSIS — Z7901 Long term (current) use of anticoagulants: Secondary | ICD-10-CM | POA: Diagnosis not present

## 2020-12-18 DIAGNOSIS — Z96653 Presence of artificial knee joint, bilateral: Secondary | ICD-10-CM | POA: Insufficient documentation

## 2020-12-18 DIAGNOSIS — I5032 Chronic diastolic (congestive) heart failure: Secondary | ICD-10-CM | POA: Diagnosis not present

## 2020-12-18 DIAGNOSIS — R009 Unspecified abnormalities of heart beat: Secondary | ICD-10-CM | POA: Diagnosis not present

## 2020-12-18 DIAGNOSIS — Z79899 Other long term (current) drug therapy: Secondary | ICD-10-CM | POA: Insufficient documentation

## 2020-12-18 DIAGNOSIS — N183 Chronic kidney disease, stage 3 unspecified: Secondary | ICD-10-CM | POA: Insufficient documentation

## 2020-12-18 DIAGNOSIS — Z87891 Personal history of nicotine dependence: Secondary | ICD-10-CM | POA: Diagnosis not present

## 2020-12-18 DIAGNOSIS — I130 Hypertensive heart and chronic kidney disease with heart failure and stage 1 through stage 4 chronic kidney disease, or unspecified chronic kidney disease: Secondary | ICD-10-CM | POA: Diagnosis not present

## 2020-12-18 DIAGNOSIS — J449 Chronic obstructive pulmonary disease, unspecified: Secondary | ICD-10-CM | POA: Diagnosis not present

## 2020-12-18 DIAGNOSIS — J45909 Unspecified asthma, uncomplicated: Secondary | ICD-10-CM | POA: Diagnosis not present

## 2020-12-18 DIAGNOSIS — S81812A Laceration without foreign body, left lower leg, initial encounter: Secondary | ICD-10-CM | POA: Insufficient documentation

## 2020-12-18 DIAGNOSIS — Z85038 Personal history of other malignant neoplasm of large intestine: Secondary | ICD-10-CM | POA: Insufficient documentation

## 2020-12-18 DIAGNOSIS — Y92007 Garden or yard of unspecified non-institutional (private) residence as the place of occurrence of the external cause: Secondary | ICD-10-CM | POA: Insufficient documentation

## 2020-12-18 DIAGNOSIS — I25118 Atherosclerotic heart disease of native coronary artery with other forms of angina pectoris: Secondary | ICD-10-CM | POA: Diagnosis not present

## 2020-12-18 DIAGNOSIS — W228XXA Striking against or struck by other objects, initial encounter: Secondary | ICD-10-CM | POA: Insufficient documentation

## 2020-12-18 MED ORDER — TETANUS-DIPHTH-ACELL PERTUSSIS 5-2.5-18.5 LF-MCG/0.5 IM SUSY
0.5000 mL | PREFILLED_SYRINGE | Freq: Once | INTRAMUSCULAR | Status: AC
  Administered 2020-12-18: 0.5 mL via INTRAMUSCULAR
  Filled 2020-12-18: qty 0.5

## 2020-12-18 NOTE — ED Provider Notes (Signed)
[facility] EMERGENCY DEPT Provider Note   CSN: [id number] Arrival date & time: 12/18/20  9937     History Chief Complaint  Patient presents with  . Laceration    [name] is a 87 y.o. female with history listed below presented for laceration to the left lateral leg. Patient was watering her garden yesterday around 5PM when she noticed a stick poke through her pants and socks and cut her on the left lateral lower leg. Did not notice much pain with this. Notes bleeding over her pants and into her shoe. Called daughter around [place] and talked to on call nurse. Daughter cleaned and dressed the wound after applying compression noted bleeding was slowing down. Went to bed with leg elevated. This morning she woke and noted pajamas were covered in blood. She does take eliquis 37m twice daily for afib last dose was around 8 am this morning. Denies fever, shortness of breath, dizziness, fall, weakness or syncope. Cannot remember her last tetanus immunization.    Past Medical History:  Diagnosis Date  . Adenocarcinoma, colon ([facility] Oncologist-- Dr YTruitt Merle  Multifocal (2) Colon cancer @ ileocecal valve and ascending ( [id number]), Stage IIIB, grade I, MMR normal , 2 of 16 +lymph nodes, negative surgical margins---  06-02-2014  Right hemicolectomy w/ colostomy  . Allergy   . Anxiety   . Asthma    inhaler "sometimes"  . CAD (coronary artery disease)    a. LHC 4/09: pLAD 40, mDx 70-75, pLCx 30, mLCx 50, mRCA 90, EF 60% >> PCI: BMS x2 to RCA;  b. Myoview 8/12: normal  . Cataract    bil cateracts removed  . Chronic anemia   . Chronic diastolic CHF (congestive heart failure) (HCC)    a. Echo 912 - Mild LVH, EF 55-60%, no RWMA, Gr 1 DD, mild MR, mild LAE, mild RAE, PASP 59 mmHg (mod to severe pulmo HTN)  . Clotting disorder (HCC)    hx of dvt, tia on eliquis  . Colostomy in place ([facility]    s/p colostomy takedown 5/16  . Colovaginal fistula    s/p colostomy >> colostomy takedown 5/16   . COPD with chronic bronchitis ([facility]   . Depression   . Dysrhythmia    A fib  . Essential hypertension   . GERD (gastroesophageal reflux disease)   . History of adenomatous polyp of colon   . History of blood transfusion   . History of DVT of lower extremity   . History of hiatal hernia   . History of TIA (transient ischemic attack)    a. Head CT 6/15: small chronic lacunar infarct in thalamus  . Hyperlipidemia   . OA (osteoarthritis)   . Osteoporosis   . Peripheral neuropathy   . Pneumonia   . Pulmonary HTN ([facility]    a. PASP on Echo in 9/12:  59 mmHg  . Renal insufficiency   . Sigmoid diverticulosis    s/p sigmoid colectomy  . Stroke ([facility]    TIAs  . Wears dentures   . Wears glasses     Patient Active Problem List   Diagnosis Date Noted  . CHF exacerbation ([facility] 11/10/2020  . Mediastinal adenopathy 01/21/2020  . Cellulitis 08/03/2019  . CKD (chronic kidney disease), stage III ([place] 08/03/2019  . Pulmonary emphysema ([facility] 04/06/2019  . Gastric ulcer   . Thrombocytopenia ([facility] 10/27/2017  . Abnormal stress test 10/27/2017  . Moderate mitral regurgitation 10/27/2017  . CAP (community acquired pneumonia) 10/26/2017  .  Displaced fracture of lateral malleolus of right fibula, subsequent encounter for closed fracture with routine healing 12/27/2016  . Iron deficiency anemia 11/12/2016  . Chronic anticoagulation 11/12/2016  . Chronic atrial fibrillation ([place]) 08/05/2016  . Coronary artery disease of native artery of native heart with stable angina pectoris ([place]) 08/05/2016  . Failed total left knee replacement ([place]) 08/02/2015  . Sigmoid diverticulitis 11/24/2014  . Neuropathic pain 06/13/2014  . Insomnia 06/13/2014  . Anxiety and depression 06/13/2014  . Cancer of ascending colon ([place]) 06/13/2014  . Colovaginal fistula 04/20/2014  . Lethargy 09/15/2013  . Chest pain, musculoskeletal 02/19/2011  . Cough 03/14/2010  . Depression 02/07/2010  . Dysuria 11/08/2009  .  Chronic diastolic CHF (congestive heart failure) ([place]) 02/27/2009  . Pure hypercholesterolemia 04/05/2008  . Coronary atherosclerosis 04/05/2008  . Osteoarthritis 05/19/2007  . Essential hypertension 01/21/2007  . GERD 01/21/2007  . Hiatal hernia 01/21/2007  . Diverticulosis of large intestine 01/21/2007  . COLONIC POLYPS, HX OF 01/21/2007    Past Surgical History:  Procedure Laterality Date  . ABDOMINAL HYSTERECTOMY    . ANTERIOR CERVICAL DECOMP/DISCECTOMY FUSION  01-31-2009   C5 -- 7  . APPENDECTOMY    . Bone spurs Bilateral    Feet  . CARDIOVASCULAR STRESS TEST  02-26-2011   Normal lexiscan no exercise study/  no ischemia/  normal LV function and wall motion, ef 67%  . CARPAL TUNNEL RELEASE Right   . CATARACT EXTRACTION W/ INTRAOCULAR LENS  IMPLANT, BILATERAL Bilateral   . CHOLECYSTECTOMY    . COLOSTOMY N/A 06/02/2014   Procedure: DIVERTING DESCENDING END COLOSTOMY;  Surgeon: Donnie Mesa, MD;  Location: [place];  Service: General;  Laterality: N/A;  . CORONARY ANGIOPLASTY WITH STENT PLACEMENT  11-04-2007  dr bensimhon   BMS x2 to RCA/  mild Non-obstructive disease LAD/  normal LVF  . DILATION AND CURETTAGE OF UTERUS    . ESOPHAGOGASTRODUODENOSCOPY N/A 10/28/2017   Procedure: ESOPHAGOGASTRODUODENOSCOPY (EGD);  Surgeon: Yetta Flock, MD;  Location[facility] ENDOSCOPY;  Service: Gastroenterology;  Laterality: N/A;  . EVALUATION UNDER ANESTHESIA WITH ANAL FISTULECTOMY N/A 10/13/2014   Procedure: ANAL EXAM UNDER ANESTHESIA ;  Surgeon: [name], MD;  Location: [facility];  Service: General;  Laterality: N/A;  . HAND SURGERY     Tendon repair  . LAPAROSCOPIC SIGMOID COLECTOMY N/A 11/24/2014   Procedure: SIGMOID COLECTOMY AND COLSOTOMY CLOSURE;  Surgeon: Donnie Mesa, MD;  Location: [place];  Service: General;  Laterality: N/A;  . LUMBAR LAMINECTOMY  10-29-2002,  1969   Left  L3 -- 4  decompression  . PARTIAL COLECTOMY N/A 06/02/2014   Procedure: RIGHT PARTIAL COLECTOMY ;  Surgeon:  Donnie Mesa, MD;  Location: [facility];  Service: General;  Laterality: N/A;  . PATELLECTOMY Right 09/14/2013   Procedure: PATELLECTOMY;  Surgeon: Meredith Pel, MD;  Location: [facility];  Service: Orthopedics;  Laterality: Right;  . PROCTOSCOPY N/A 10/13/2014   Procedure: RIDGE PROCTOSCOPY;  Surgeon: [name], MD;  Location: [facility];  Service: General;  Laterality: N/A;  . REVISION TOTAL KNEE ARTHROPLASTY Bilateral right  04-02-2011/  left 1996 & 10-05-1999  . ROTATOR CUFF REPAIR Bilateral   . TONSILLECTOMY    . TOTAL KNEE ARTHROPLASTY Bilateral left 1994/  right 2000  . TOTAL KNEE REVISION Left 08/02/2015   Procedure: LEFT FEMORAL REVISION;  Surgeon: Gaynelle Arabian, MD;  Location: [facility];  Service: Orthopedics;  Laterality: Left;  . TRANSTHORACIC ECHOCARDIOGRAM  12-01-2009   Grade I diastolic dysfunction/  ef 60%/  moderate MR/  mild TR     OB History   No obstetric history on file.     Family History  Problem Relation Age of Onset  . Osteoarthritis Mother   . Heart failure Mother   . Colon cancer Maternal Uncle 27  . Heart Problems Father   . Cancer Sister 69       ? colon cancer   . Kidney cancer Brother 12  . Esophageal cancer Neg Hx   . Rectal cancer Neg Hx   . Stomach cancer Neg Hx   . Pancreatic cancer Neg Hx     Social History   Tobacco Use  . Smoking status: Former Smoker    Packs/day: 0.50    Years: 16.00    Pack years: 8.00    Types: Cigarettes    Start date: 59    Quit date: 07/16/1967    Years since quitting: 53.4  . Smokeless tobacco: Never Used  Vaping Use  . Vaping Use: Never used  Substance Use Topics  . Alcohol use: No    Alcohol/week: 0.0 standard drinks  . Drug use: No    Home Medications Prior to Admission medications   Medication Sig Start Date End Date Taking? Authorizing Provider  allopurinol (ZYLOPRIM) 100 MG tablet TAKE ONE TABLET BY MOUTH ONCE DAILY Patient taking differently: Take 100 mg by mouth daily. 07/11/20  Yes Nafziger, [name],  NP  apixaban (ELIQUIS) 5 MG TABS tablet Take 1 tablet (5 mg total) by mouth 2 (two) times daily. 11/27/20 02/25/21 Yes Nafziger, [name], NP  Ascorbic Acid (VITAMIN C) 1000 MG tablet Take 1,000 mg by mouth daily.   Yes [provider]  esomeprazole (NEXIUM) 40 MG capsule TAKE ONE CAPSULE BY MOUTH ONCE DAILY 10/10/20  Yes Nafziger, [name], NP  famotidine (PEPCID) 20 MG tablet TAKE ONE TABLET BY MOUTH EVERYDAY AT BEDTIME 04/22/20  Yes Martyn Ehrich, NP  ferrous sulfate 325 (65 FE) MG EC tablet Take 325 mg by mouth daily with breakfast.   Yes [provider]  furosemide (LASIX) 20 MG tablet Take 2 tablets (40 mg total) by mouth daily. 10/09/20  Yes [name], Wonda Cheng, MD  gabapentin (NEURONTIN) 600 MG tablet TAKE ONE TABLET BY MOUTH TWICE DAILY 10/10/20  Yes Nafziger, [name], NP  LORazepam (ATIVAN) 0.5 MG tablet TAKE ONE TABLET BY MOUTH EVERYDAY AT BEDTIME Patient taking differently: Take 0.5 mg by mouth at bedtime. 11/01/20  Yes Nafziger, [name], NP  metoprolol tartrate (LOPRESSOR) 50 MG tablet TAKE 1 AND 1/2 TABLETS BY MOUTH TWICE DAILY Patient taking differently: Take 50 mg by mouth 2 (two) times daily. 10/10/20  Yes Nafziger, [name], NP  sertraline (ZOLOFT) 50 MG tablet TAKE ONE TABLET BY MOUTH ONCE DAILY Patient taking differently: Take 50 mg by mouth daily. 10/10/20  Yes Nafziger, [name], NP  acetaminophen (TYLENOL) 500 MG tablet Take 1,000 mg by mouth every 6 (six) hours as needed for moderate pain.     [provider]  albuterol (PROAIR HFA) 108 (90 Base) MCG/ACT inhaler Inhale 2 puffs into the lungs every 6 (six) hours as needed for wheezing or shortness of breath. 04/05/19   [name], MD  HYDROcodone-acetaminophen (NORCO/VICODIN) 5-325 MG tablet Take 1 tablet by mouth every 6 (six) hours as needed for severe pain. 11/13/20   Mikhail, Velta Addison, DO  nitroGLYCERIN (NITROSTAT) 0.4 MG SL tablet Place 1 tablet (0.4 mg total) under the tongue every 5 (five) minutes as needed for chest  pain (x 3 pills).  Reported on 09/01/2015 04/06/19   [name], NP  silver sulfADIAZINE (SILVADENE) 1 % cream Apply 1 application topically 2 (two) times daily. 01/31/20   [provider]  Spacer/Aero-Holding Chambers (E-Z SPACER) inhaler Use as instructed 03/20/18   [name], MD  Tiotropium Bromide Monohydrate (SPIRIVA RESPIMAT) 2.5 MCG/ACT AERS Inhale 2 puffs into the lungs daily at 2 PM. 08/24/20 09/23/20  [name], MD  Tiotropium Bromide Monohydrate (SPIRIVA RESPIMAT) 2.5 MCG/ACT AERS Inhale 2 puffs into the lungs daily. 08/24/20   [name], MD  traZODone (DESYREL) 50 MG tablet TAKE ONE TABLET BY MOUTH EVERYDAY AT BEDTIME Patient taking differently: Take 50 mg by mouth at bedtime. 10/10/20   Nafziger, [name], NP  triamcinolone cream (KENALOG) 0.1 % Apply 1 application topically 2 (two) times daily.     [provider]  umeclidinium-vilanterol (ANORO ELLIPTA) 62.5-25 MCG/INH AEPB Inhale 1 puff into the lungs daily. 12/13/20   [name], MD    Allergies    Patient has no known allergies.  Review of Systems   Review of Systems  Constitutional: Negative for appetite change and fever.  HENT: Negative for congestion and sore throat.   Eyes: Negative for photophobia and visual disturbance.  Respiratory: Negative for chest tightness and shortness of breath.   Cardiovascular: Negative for chest pain and palpitations.  Gastrointestinal: Negative for abdominal pain and vomiting.  Endocrine: Negative for cold intolerance and polyuria.  Genitourinary: Negative for dysuria and flank pain.  Musculoskeletal: Negative for back pain.  Skin: Positive for wound (left lower leg).  Neurological: Negative for dizziness, syncope, weakness, light-headedness and numbness.  Psychiatric/Behavioral: Negative for behavioral problems and confusion.    Physical Exam Updated Vital Signs BP 118/72 (BP Location: Right Arm)   Pulse 81   Temp 98.5 F (36.9 C) (Oral)    Resp 16   Ht 5' 3.5" (1.613 m)   Wt 83.8 kg   SpO2 95%   BMI 32.22 kg/m   Physical Exam Constitutional:      Appearance: Normal appearance.  HENT:     Head: Normocephalic and atraumatic.     Right Ear: External ear normal.     Left Ear: External ear normal.     Nose: Nose normal. No congestion.     Mouth/Throat:     Mouth: Mucous membranes are moist.     Pharynx: Oropharynx is clear.  Eyes:     Extraocular Movements: Extraocular movements intact.     Conjunctiva/sclera: Conjunctivae normal.     Pupils: Pupils are equal, round, and reactive to light.  Cardiovascular:     Rate and Rhythm: Normal rate. Rhythm irregular.     Pulses: Normal pulses.  Pulmonary:     Effort: Pulmonary effort is normal.     Breath sounds: Normal breath sounds. No wheezing, rhonchi or rales.  Abdominal:     General: Abdomen is flat. Bowel sounds are normal.     Palpations: Abdomen is soft.  Musculoskeletal:        General: No deformity.     Right lower leg: No edema.     Left lower leg: No edema.  Skin:    General: Skin is warm and dry.     Capillary Refill: Capillary refill takes less than 2 seconds.     Comments: 2 cm skin tear of the left lateral leg, small amount of blood on dressing but no active bleeding, normal DP pulse on the left leg, sensation intact, no hematoma  Neurological:     General: No focal deficit present.     Mental Status: She is alert and oriented to person, place, and time.  Psychiatric:        Mood and Affect: Mood normal.        Behavior: Behavior normal.    ED Results / Procedures / Treatments   Labs (all labs ordered are listed, but only abnormal results are displayed) Labs Reviewed - No data to display  EKG None  Radiology DG Tibia/Fibula Left  Result Date: 12/18/2020 CLINICAL DATA:  Left lower leg laceration on a stick. EXAM: LEFT TIBIA AND FIBULA - 2 VIEW COMPARISON:  Plain films left knee 06/08/2019. FINDINGS: No acute bony or joint abnormality. Knee  arthroplasty noted. Bandaging about the lateral aspect of the left lower leg is seen. There is stranding in subcutaneous fat. No radiopaque foreign body or soft tissue gas. IMPRESSION: Stranding in subcutaneous fat could be due to dependent change or cellulitis. Negative for acute bony or joint abnormality. Left knee arthroplasty noted. Electronically Signed   By: Inge Rise M.D.   On: 12/18/2020 10:36    Medications Ordered in ED Medications  Tdap (BOOSTRIX) injection 0.5 mL (0.5 mLs Intramuscular Given 12/18/20 1026)    ED Course  I have reviewed the triage vital signs and the nursing notes.  Pertinent labs & imaging results that were available during my care of the patient were reviewed by me and considered in my medical decision making (see chart for details).    MDM Rules/Calculators/A&P                         Patient is a 87 year old female on Eliquis for atrial fibrillation presenting for bleeding from skin tear/small laceration to the left lateral lower leg while in the garden. HD stable. Small 2 cm skin tear on exam without active bleeding, distal pulses and sensation intact. No erythema, warmth, crepitus, or other signs of infection. No indication for antibiotics at this time. No foreign body on imaging. Last Tdap appears to be in 2012. Tdap updated. Skin tear cleaned and dressed. Patient discharge home with daughter with return precautions for uncontrolled bleeding or signs of infection.  Final Clinical Impression(s) / ED Diagnoses Final diagnoses:  Skin tear of left lower leg without complication, initial encounter    Rx / DC Orders ED Discharge Orders    None       Iona Beard, MD 12/18/20 1059    Tegeler, Gwenyth Allegra, MD 12/18/20 1122

## 2020-12-18 NOTE — ED Notes (Signed)
ED Provider at bedside. 

## 2020-12-18 NOTE — ED Notes (Signed)
Took off blood soaked old bandage. Cleaned Out Wound with NS.

## 2020-12-18 NOTE — Discharge Instructions (Addendum)
Dear [name] it was a pleasure meeting you today. You were evaluated for a skin tear on your left lower leg. The bleeding appears to have stopped and your vitals appear normal. The xray of you leg did not show any foreign body in the leg. Your wound was bandaged and wrapped and we updated your tetanus immunization. Please keep the wound cleaned and ry. Please continue to take you Eliquis as normal. Please return if bleeding is not controllable, if the wound become red, inflamed, swollen, or painful, or if you develop numbness or dizziness.

## 2020-12-18 NOTE — ED Triage Notes (Signed)
A stick was stuck to her left lateral lower leg while she was watering her plant yesterday.  Patient is on Eliquis.

## 2020-12-20 ENCOUNTER — Telehealth: Payer: Self-pay

## 2020-12-20 NOTE — Chronic Care Management (AMB) (Signed)
    Chronic Care Management Pharmacy Assistant   Name: [name]  MRN: [id number] DOB: 31-Jan-1930  12/20/20- Called patient to remind of appointment with Jeni Salles) on (12/21/20 at 2:45pm by phone)  Patient aware of appointment date, time, and type of appointment (either telephone or in person). Patient aware to have/bring all medications, supplements, blood pressure and/or blood sugar logs to visit.  Questions: Have you had any recent office visit or specialist visit outside of [place]? No.  Are there any concerns you would like to discuss during your office visit? No concerns at this time. Are you having any problems obtaining your medications? (Whether it pharmacy issues or cost) No.  If patient has any PAP medications ask if they are having any problems getting their PAP medication or refill? No PAP at this time.   Star Rating Drug:  None.   Any gaps in medications fill history? N/A  [place] Pharmacist Assistant [phone]

## 2020-12-21 ENCOUNTER — Ambulatory Visit (INDEPENDENT_AMBULATORY_CARE_PROVIDER_SITE_OTHER): Payer: HMO | Admitting: Pharmacist

## 2020-12-21 ENCOUNTER — Other Ambulatory Visit: Payer: Self-pay | Admitting: Adult Health

## 2020-12-21 DIAGNOSIS — I482 Chronic atrial fibrillation, unspecified: Secondary | ICD-10-CM

## 2020-12-21 DIAGNOSIS — I10 Essential (primary) hypertension: Secondary | ICD-10-CM

## 2020-12-21 MED ORDER — LOSARTAN POTASSIUM 25 MG PO TABS: 25.0000 mg | ORAL_TABLET | Freq: Every day | ORAL | 1 refills | Status: DC

## 2020-12-21 NOTE — Progress Notes (Signed)
 Chronic Care Management Pharmacy Note  01/11/2021 Name:  [name] MRN:  [id number] DOB:  10/05/1929  Summary: Patient is doing much better with new inhaler BP at goal of < 140/90 per home readings  Recommendations/Changes made from today's visit: -Recommend for uric acid level -Recommended for patient to restart taking losartan 25 mg daily -Coordinated fill for losartan from [facility]  Plan: Follow up COPD assessment in 2 months   Subjective: [name] is an 87 y.o. year old female who is a primary patient of Nafziger, Cory, NP.  The CCM team was consulted for assistance with disease management and care coordination needs.    Engaged with patient by telephone for follow up visit in response to provider referral for pharmacy case management and/or care coordination services.   Consent to Services:  The patient was given information about Chronic Care Management services, agreed to services, and gave verbal consent prior to initiation of services.  Please see initial visit note for detailed documentation.   Patient Care Team: Nafziger, Cory, NP as PCP - General (Family Medicine) [name], Philip J, MD as PCP - Cardiology (Cardiology) Aryal, Govinda, MD as Consulting Physician (Rheumatology) Pryor, Madeline G, RPH as Pharmacist (Pharmacist)  Recent office visits: 11/22/20 Nafziger, Cory, NP: patient presented for a hospital follow up. Prescribed prednisone for gout flare up.  09/09/20 Burchette, Bruce W, MD: Patient presented for URI. D/c'd Benazepril 40 mg and prescribed losartan 50 mg tablets one tablet by mouth daily. Prescribed doxycycline and prednisone for URI.  Recent consult visits: 12/13/20 Telephone encounter with pulmonary: Patient reported mouth sores with Trelegy. Switched her to Anoro.  12/05/20 Ellison, Chi Jane, MD (Pulmonary Disease): patient seen for pulmonary emphysema follow up. Prescribed Trelegy Ellipta 100-62.5-25 MCG/INH  and patient was given  a two-week trial.  08/24/20 Ellison, Chi Jane, MD (Pulmonary Disease): patient seen for pulmonary emphysema follow up. D/c'd Breztri and started Spiriva.  08/09/20 Gregory Dean, MD (ortho): Patient presented with right foot pain and shoulder pain. Steroid injection administered.  07/24/20 Ellison, Chi Jane, MD (Pulmonary Disease): patient seen for pulmonary emphysema follow up. D/c'd Anoro and started Breztri.  Hospital visits: 12/1620 Patient presented to the ED with a skin tear of her left lower leg.  Medication Reconciliation was completed by comparing discharge summary, patient's EMR and Pharmacy list, and upon discussion with patient.   Admitted to the hospital on 04.29.2022 due to CHF exacerbation . Discharge date was 05.02.2022. Discharged from [facility].     New?Medications Started at Hospital Discharge:?? -started the following medication Diclofenac Sodium1%:  apply 2 grams topically four times a day Hydrocodone-acetaminophen 5-325 mg: take one tablet by mouth every six hours as needed for severe pain   Medication Changes at Hospital Discharge: Losartan 50 mg: take 0.5 tablets by mouth daily   Medications Discontinued at Hospital Discharge: Doxycycline 100 MG capsule (discontinue)   Medications that remain the same after Hospital Discharge:?? -All other medications will remain the same.       Objective:  Lab Results  Component Value Date   CREATININE 1.30 (H) 11/13/2020   BUN 37 (H) 11/13/2020   GFR 32.06 (L) 10/05/2019   GFRNONAA 40 (L) 11/13/2020   GFRAA 37 (L) 06/06/2020   NA 140 11/13/2020   K 3.7 11/13/2020   CALCIUM 8.9 11/13/2020   CO2 31 11/13/2020   GLUCOSE 95 11/13/2020    Lab Results  Component Value Date/Time   HGBA1C 5.9 (H) 05/31/2014 07:09 PM     HGBA1C 5.8 (H) 12/28/2013 05:15 AM   GFR 32.06 (L) 10/05/2019 10:12 AM   GFR 32.62 (L) 12/10/2018 09:17 AM    Last diabetic Eye exam: No results found for: HMDIABEYEEXA  Last  diabetic Foot exam: No results found for: HMDIABFOOTEX   Lab Results  Component Value Date   CHOL 130 01/19/2013   HDL 33.60 (L) 01/19/2013   LDLCALC 79 01/19/2013   TRIG 87.0 01/19/2013   CHOLHDL 4 01/19/2013    Hepatic Function Latest Ref Rng & Units 11/10/2020 09/06/2019 08/03/2019  Total Protein 6.5 - 8.1 g/dL 7.1 - 7.4  Albumin 3.5 - 5.0 g/dL 4.1 4.1 3.7  AST 15 - 41 U/L 32 22 26  ALT 0 - 44 U/L _0 Alk Phosphatase 38 - 126 U/L 58 64 57  Total Bilirubin 0.3 - 1.2 mg/dL 1.0 - 0.8  Bilirubin, Direct 0.0 - 0.3 mg/dL - - -    Lab Results  Component Value Date/Time   TSH 2.620 06/06/2020 11:28 AM   TSH 4.160 05/31/2014 07:09 PM    CBC Latest Ref Rng & Units 11/12/2020 11/10/2020 06/06/2020  WBC 4.0 - 10.5 K/uL 9.2 9.9 7.6  Hemoglobin 12.0 - 15.0 g/dL 10.8(L) 12.5 12.1  Hematocrit 36.0 - 46.0 % 35.1(L) 40.1 35.8  Platelets 150 - 400 K/uL 111(L) 136(L) 160    Lab Results  Component Value Date/Time   VD25OH 53 12/27/2013 06:36 PM    Clinical ASCVD: Yes  The ASCVD Risk score [name], et al., 2013) failed to calculate for the following reasons:   The 2013 ASCVD risk score is only valid for ages 22 to 32    Depression screen PHQ 2/9 05/24/2020 01/25/2020 07/28/2019  Decreased Interest 0 0 0  Down, Depressed, Hopeless 0 0 0  PHQ - 2 Score 0 0 0  Some recent data might be hidden     CHA2DS2/VAS Stroke Risk Points  Current as of about an hour ago     6 >= 2 Points: High Risk  1 - 1.99 Points: Medium Risk  0 Points: Low Risk    Last Change: N/A      Details    This score determines the patient's risk of having a stroke if the  patient has atrial fibrillation.       Points Metrics  1 Has Congestive Heart Failure:  Yes    Current as of about an hour ago  1 Has Vascular Disease:  Yes    Current as of about an hour ago  1 Has Hypertension:  Yes    Current as of about an hour ago  2 Age:  75    Current as of about an hour ago  0 Has Diabetes:  No    Current  as of about an hour ago  0 Had Stroke:  No  Had TIA:  No  Had Thromboembolism:  No    Current as of about an hour ago  1 Female:  Yes    Current as of about an hour ago       mMRC Dyspnea Scale mMRC Score  07/24/2020 2    No flowsheet data found.    Social History   Tobacco Use  Smoking Status Former   Packs/day: 0.50   Years: 16.00   Pack years: 8.00   Types: Cigarettes   Start date: 26   Quit date: 07/16/1967   Years since quitting: 53.5  Smokeless Tobacco Never   BP Readings  from Last 3 Encounters:  01/11/21 118/70  12/18/20 110/71  12/05/20 118/68   Pulse Readings from Last 3 Encounters:  01/11/21 75  12/18/20 80  12/05/20 79   Wt Readings from Last 3 Encounters:  01/11/21 190 lb 9.6 oz (86.5 kg)  12/18/20 184 lb 12.6 oz (83.8 kg)  12/05/20 184 lb 12.8 oz (83.8 kg)   BMI Readings from Last 3 Encounters:  01/11/21 33.76 kg/m  12/18/20 32.22 kg/m  12/05/20 32.22 kg/m    Assessment/Interventions: Review of patient past medical history, allergies, medications, health status, including review of consultants reports, laboratory and other test data, was performed as part of comprehensive evaluation and provision of chronic care management services.   SDOH:  (Social Determinants of Health) assessments and interventions performed: No  SDOH Screenings   Alcohol Screen: Not on file  Depression (PHQ2-9): Low Risk    PHQ-2 Score: 0  Financial Resource Strain: Low Risk    Difficulty of Paying Living Expenses: Not hard at all  Food Insecurity: No Food Insecurity   Worried About Running Out of Food in the Last Year: Never true   Ran Out of Food in the Last Year: Never true  Housing: Low Risk    Last Housing Risk Score: 0  Physical Activity: Inactive   Days of Exercise per Week: 0 days   Minutes of Exercise per Session: 0 min  Social Connections: Moderately Isolated   Frequency of Communication with Friends and Family: More than three times a week    Frequency of Social Gatherings with Friends and Family: Once a week   Attends Religious Services: 1 to 4 times per year   Active Member of Clubs or Organizations: No   Attends Club or Organization Meetings: Never   Marital Status: Widowed  Stress: No Stress Concern Present   Feeling of Stress : Not at all  Tobacco Use: Medium Risk   Smoking Tobacco Use: Former   Smokeless Tobacco Use: Never  Transportation Needs: No Transportation Needs   Lack of Transportation (Medical): No   Lack of Transportation (Non-Medical): No    CCM Care Plan  No Known Allergies  Medications Reviewed Today     Reviewed by Robinson, Wanda R, CMA (Certified Medical Assistant) on 01/11/21 at 1200  Med List Status: <None>   Medication Order Taking? Sig Documenting Provider Last Dose Status Informant  acetaminophen (TYLENOL) 500 MG tablet [id number] Yes Take 1,000 mg by mouth every 6 (six) hours as needed for moderate pain.  [provider] Taking Active Self  albuterol (PROAIR HFA) 108 (90 Base) MCG/ACT inhaler [id number] Yes Inhale 2 puffs into the lungs every 6 (six) hours as needed for wheezing or shortness of breath. Ellison, Chi Jane, MD Taking Active Self  allopurinol (ZYLOPRIM) 100 MG tablet [id number] Yes TAKE ONE TABLET BY MOUTH ONCE DAILY Nafziger, Cory, NP Taking Active Self  apixaban (ELIQUIS) 5 MG TABS tablet [id number] Yes Take 1 tablet (5 mg total) by mouth 2 (two) times daily. Nafziger, Cory, NP Taking Active   Ascorbic Acid (VITAMIN C) 1000 MG tablet [id number] Yes Take 1,000 mg by mouth daily. [provider] Taking Active Self  esomeprazole (NEXIUM) 40 MG capsule [id number] Yes TAKE ONE CAPSULE BY MOUTH ONCE DAILY Nafziger, Cory, NP Taking Active Self  famotidine (PEPCID) 20 MG tablet [id number] Yes TAKE ONE TABLET BY MOUTH EVERYDAY AT BEDTIME Walsh, Elizabeth W, NP Taking Active Self  ferrous sulfate 325 (65 FE) MG EC tablet [id number] Yes Take 325 mg by mouth   daily with breakfast.  [provider] Taking Active Self  furosemide (LASIX) 20 MG tablet [id number] Yes Take 2 tablets (40 mg total) by mouth daily. [name], Philip J, MD Taking Active Self  gabapentin (NEURONTIN) 600 MG tablet [id number] Yes TAKE ONE TABLET BY MOUTH TWICE DAILY Nafziger, Cory, NP Taking Active Self  HYDROcodone-acetaminophen (NORCO/VICODIN) 5-325 MG tablet [id number] Yes Take 1 tablet by mouth every 6 (six) hours as needed for severe pain. Mikhail, Maryann, DO Taking Active   LORazepam (ATIVAN) 0.5 MG tablet [id number] Yes TAKE ONE TABLET BY MOUTH EVERYDAY AT BEDTIME Nafziger, Cory, NP Taking Active   losartan (COZAAR) 25 MG tablet [id number] Yes Take 1 tablet (25 mg total) by mouth daily. Nafziger, Cory, NP Taking Active   metoprolol tartrate (LOPRESSOR) 50 MG tablet [id number] Yes TAKE 1 AND 1/2 TABLETS BY MOUTH TWICE DAILY Nafziger, Cory, NP Taking Active   nitroGLYCERIN (NITROSTAT) 0.4 MG SL tablet [id number] Yes Place 1 tablet (0.4 mg total) under the tongue every 5 (five) minutes as needed for chest pain (x 3 pills). Reported on 09/01/2015 Nafziger, Cory, NP Taking Active Self  sertraline (ZOLOFT) 50 MG tablet [id number] Yes TAKE ONE TABLET BY MOUTH ONCE DAILY Nafziger, Cory, NP Taking Active   silver sulfADIAZINE (SILVADENE) 1 % cream [id number] Yes Apply 1 application topically 2 (two) times daily. [provider] Taking Active Self           Med Note (ZURITA, ARACELY   Tue Jun 06, 2020 11:08 AM)    Spacer/Aero-Holding Chambers (E-Z SPACER) inhaler [id number] Yes Use as instructed Koberlein, Junell C, MD Taking Active Self  Tiotropium Bromide Monohydrate (SPIRIVA RESPIMAT) 2.5 MCG/ACT AERS [id number]  Inhale 2 puffs into the lungs daily at 2 PM. Ellison, Chi Jane, MD  Expired 09/23/20 2359   Tiotropium Bromide Monohydrate (SPIRIVA RESPIMAT) 2.5 MCG/ACT AERS [id number] Yes Inhale 2 puffs into the lungs daily. Ellison, Chi Jane, MD Taking Active Self  traZODone (DESYREL) 50 MG tablet [id number]  Yes TAKE ONE TABLET BY MOUTH EVERYDAY AT BEDTIME Nafziger, Cory, NP Taking Active   triamcinolone cream (KENALOG) 0.1 % [id number] Yes Apply 1 application topically 2 (two) times daily.  [provider] Taking Active Self            Patient Active Problem List   Diagnosis Date Noted   CHF exacerbation (HCC) 11/10/2020   Mediastinal adenopathy 01/21/2020   Cellulitis 08/03/2019   CKD (chronic kidney disease), stage III (HCC) 08/03/2019   Pulmonary emphysema (HCC) 04/06/2019   Gastric ulcer    Thrombocytopenia (HCC) 10/27/2017   Abnormal stress test 10/27/2017   Moderate mitral regurgitation 10/27/2017   CAP (community acquired pneumonia) 10/26/2017   Displaced fracture of lateral malleolus of right fibula, subsequent encounter for closed fracture with routine healing 12/27/2016   Iron deficiency anemia 11/12/2016   Chronic anticoagulation 11/12/2016   Chronic atrial fibrillation (HCC) 08/05/2016   Coronary artery disease of native artery of native heart with stable angina pectoris (HCC) 08/05/2016   Failed total left knee replacement (HCC) 08/02/2015   Sigmoid diverticulitis 11/24/2014   Neuropathic pain 06/13/2014   Insomnia 06/13/2014   Anxiety and depression 06/13/2014   Cancer of ascending colon (HCC) 06/13/2014   Colovaginal fistula 04/20/2014   Lethargy 09/15/2013   Chest pain, musculoskeletal 02/19/2011   Cough 03/14/2010   Depression 02/07/2010   Dysuria 11/08/2009   Chronic diastolic CHF (congestive heart failure) (HCC) 02/27/2009   Pure hypercholesterolemia 04/05/2008   Coronary atherosclerosis 04/05/2008   Osteoarthritis 05/19/2007     Essential hypertension 01/21/2007   GERD 01/21/2007   Hiatal hernia 01/21/2007   Diverticulosis of large intestine 01/21/2007   COLONIC POLYPS, HX OF 01/21/2007    Immunization History  Administered Date(s) Administered   Fluad Quad(high Dose 65+) 04/05/2019   Influenza Split 06/21/2011, 04/21/2012   Influenza Whole  05/19/2007, 04/05/2008, 04/26/2010   Influenza, High Dose Seasonal PF 05/17/2015, 05/22/2016, 04/11/2017, 04/01/2018   Influenza,inj,Quad PF,6+ Mos 03/10/2013   Influenza-Unspecified 04/14/2014, 04/29/2020   PFIZER(Purple Top)SARS-COV-2 Vaccination 02/02/2020, 02/23/2020   PPD Test 06/10/2014, 06/24/2014, 08/18/2015   Pneumococcal Conjugate-13 05/17/2015   Pneumococcal Polysaccharide-23 02/10/2013   Tdap 11/19/2010, 12/18/2020   Patient has been having HTA nurse come out to check on her and keep an eye on her wound.  Conditions to be addressed/monitored:  Hypertension, Hyperlipidemia, Atrial Fibrillation, Heart Failure, Coronary Artery Disease, GERD, COPD, Chronic Kidney Disease, Depression, Anxiety, Osteoarthritis, Gout, and Neuropathy, Iron deficiency anemia  Care Plan : CCM Pharmacy Care Plan  Updates made by Pryor, Madeline G, RPH since 01/11/2021 12:00 AM     Problem: Problem: Hypertension, Hyperlipidemia, Atrial Fibrillation, Heart Failure, Coronary Artery Disease, GERD, COPD, Chronic Kidney Disease, Depression, Anxiety, Osteoarthritis, Gout, and Neuropathy, Iron deficiency anemia      Long-Range Goal: Patient-Specific Goal   Start Date: 12/21/2020  Expected End Date: 12/21/2021  This Visit's Progress: On track  Priority: High  Note:   Current Barriers:  Unable to independently monitor therapeutic efficacy Unable to maintain control of COPD  Pharmacist Clinical Goal(s):  Patient will achieve adherence to monitoring guidelines and medication adherence to achieve therapeutic efficacy through collaboration with PharmD and provider.   Interventions: 1:1 collaboration with Nafziger, Cory, NP regarding development and update of comprehensive plan of care as evidenced by provider attestation and co-signature Inter-disciplinary care team collaboration (see longitudinal plan of care) Comprehensive medication review performed; medication list updated in electronic medical  record  Hypertension (BP goal <140/90) -Controlled -Current treatment: metoprolol tartrate 50mg, 1.5 (one and one-half) tablets twice daily Losartan 50 mg 1/2 tablet daily - haven't been taking consistently -Medications previously tried: diltiazem, benazepril (cough) -Current home readings: 116/68, not often <100, 105/65, 113/57 -Current dietary habits: did not discuss -Current exercise habits: limited -Denies hypotensive/hypertensive symptoms -Educated on Exercise goal of 150 minutes per week; Importance of home blood pressure monitoring; Proper BP monitoring technique; -Counseled to monitor BP at home weekly, document, and provide log at future appointments -Counseled on diet and exercise extensively Recommended to continue current medication Recommended taking losartan daily  Hyperlipidemia/coronary atherosclerosis: (LDL goal < 70) -Uncontrolled -Current treatment: nitroglycerin 0.4mg, 1 tablet under tongue every five minutes as needed for chest pain (x 3 pills) (last dose taken January 2021). -Medications previously tried: simvastatin (myalgias)  -Current dietary patterns: did not discuss -Current exercise habits: limited -Educated on Cholesterol goals;  Importance of limiting foods high in cholesterol; Exercise goal of 150 minutes per week; -Counseled on diet and exercise extensively Due to age and ok with cardiology to not prescribe statin therapy  Atrial Fibrillation (Goal: prevent stroke and major bleeding) -Controlled -CHADSVASC: 6 -Current treatment: Rate control: metoprolol tartrate 50mg, 1.5 (one and one-half) tablets twice daily Anticoagulation: apixaban (Eliquis) 5mg 1 tablet twice daily -Medications previously tried: none -Home BP and HR readings: refer to above  -Counseled on increased risk of stroke due to Afib and benefits of anticoagulation for stroke prevention; importance of adherence to anticoagulant exactly as prescribed; avoidance of NSAIDs due to  increased bleeding risk with anticoagulants; -Counseled on diet and   exercise extensively Recommended to continue current medication Assessed patient finances. Once patient reaches 3% OOP minimum, she will be eligible for PAP.  Heart Failure (Goal: manage symptoms and prevent exacerbations) -Controlled -Last ejection fraction: 60-65% (Date: 11/11/20) -HF type: Diastolic -NYHA Class: III (marked limitation of activity) -AHA HF Stage: C (Heart disease and symptoms present) -Current treatment: metoprolol tartrate 53m, 1.5 (one and one-half) tablets twice daily furosemide 252m 2 tablets once daily Losartan 50 mg 1/2 tablet daily -Medications previously tried: none  -Current home BP/HR readings: refer to above -Current dietary habits: did not discuss -Current exercise habits: limited in ability -Educated on Importance of weighing daily; if you gain more than 3 pounds in one day or 5 pounds in one week, call cardiologist Importance of blood pressure control -Counseled on diet and exercise extensively Recommended to continue current medication  COPD (Goal: control symptoms and prevent exacerbations) -Controlled -Current treatment  albuterol (Proair HFA) 108 mcg/act inhaler, 2 puffs every six hours as needed for wheezing or shortness of breath Anoro ellipta inhale once puff once daily -Medications previously tried: FlBaristaTrelegy (mouth sores), Stiolto (cost), Breztri, Spiriva -Gold Grade: n/a -Current COPD Classification:  C (low sx, >/=2 exacerbations/yr) -MMRC/CAT score: 2 (07/24/20) -Pulmonary function testing: none on file -Exacerbations requiring treatment in last 6 months: none -Patient reports consistent use of maintenance inhaler -Frequency of rescue inhaler use: not even once a week -Counseled on When to use rescue inhaler Differences between maintenance and rescue inhalers -Counseled on diet and exercise extensively Recommended to continue current medication Assessed  patient finances. Patient would likely qualify for patient assistance.  Depression/Anxiety (Goal: minimize symptoms) -Controlled -Current treatment: sertraline 5090m1 tablet once daily -Medications previously tried/failed: none -PHQ9: 0 -GAD7: n/a -Educated on Benefits of medication for symptom control -Recommended to continue current medication  Insomnia (Goal: improve quality and quantity of sleep) -Controlled -Current treatment  trazodone 3m32m tablet once daily at bedtime lorazepam 0.5mg,58mtablet twice daily as needed for anxiety or sleep (patient reports taking only at bedtime) -Medications previously tried: none  -Recommended to continue current medication Educated on risks of taking benzodiazepines long term  Osteoarthritis (Goal: minimize pain) -Controlled -Current treatment  acetaminophen (Tylenol) 500mg,69mablets every six hours as needed for moderate pain -Medications previously tried: none  -Recommended to continue current medication  Gout (Goal: prevent flare ups and uric acid < 6) -Not ideally controlled -Current treatment  colchicine 0.6mg as72meded Allopurinol 100 mg 1 tablet daily -Medications previously tried: none  -Recommended to continue current medication Recommended repeat uric acid level  GERD (Goal: minimize symptoms of heartburn) -Controlled -Current treatment  esomeprazole 40mg, 156msule once daily -Medications previously tried: none  -Recommended to continue current medication  Iron deficiency anemia (Goal: Hgb > 11) -Controlled -Current treatment  ferrous sulfate 325mg 1 t76mt once daily with breakfast  -Medications previously tried: none  -Recommended to continue current medication  Neuropathy (Goal: minimize pain/tingling/numbness) -Controlled -Current treatment  gabapentin 600mg, 1 t67mt twice daily -Medications previously tried: none  -Recommended to continue current medication   Health Maintenance -Vaccine gaps:  shingrix, COVID booster -Current therapy:  None -Educated on Cost vs benefit of each product must be carefully weighed by individual consumer -Patient is satisfied with current therapy and denies issues -Recommended to continue as is  Patient Goals/Self-Care Activities Patient will:  - take medications as prescribed check blood pressure a few times a week, document, and provide at future appointments target a minimum of 150  minutes of moderate intensity exercise weekly  Follow Up Plan: Telephone follow up appointment with care management team member scheduled for: 6 months      Medication Assistance: None required.  Patient affirms current coverage meets needs.  Compliance/Adherence/Medication fill history: Care Gaps: Shingrix, COVID booster  Star-Rating Drugs: Losartan 50 mg - last filled 11/13/20 for 60 days supply  Patient's preferred pharmacy is:  [facility] - [place], [place] - [address] [place] [address] Phone: [phone] Fax: [phone]  [id number] - [place] - [address] [place] [address] Phone: [phone] Fax: [phone]  Uses pill box? No - adherence packaging Pt endorses 90% compliance - was not taking losartan daily  We discussed: Benefits of medication synchronization, packaging and delivery as well as enhanced pharmacist oversight with [facility]. Patient decided to: Utilize [facility] pharmacy for medication synchronization, packaging and delivery  Care Plan and Follow Up Patient Decision:  Patient agrees to Care Plan and Follow-up.  Plan: Telephone follow up appointment with care management team member scheduled for:  6 months  Madeline Pryor, PharmD, BCACP Clinical Pharmacist [facility] [phone]     

## 2021-01-04 ENCOUNTER — Telehealth: Payer: Self-pay | Admitting: Pharmacist

## 2021-01-04 NOTE — Chronic Care Management (AMB) (Signed)
Chronic Care Management Pharmacy Assistant   Name: Elanor Cale Virrueta  MRN: [id number] DOB: [birth year]  Reason for Encounter: Medication Review-Medication Coordination Call   Recent office visits:  None  Recent consult visits:  None  Hospital visits:  None in previous 6 months  Medications: Outpatient Encounter Medications as of 01/04/2021  Medication Sig   acetaminophen (TYLENOL) 500 MG tablet Take 1,000 mg by mouth every 6 (six) hours as needed for moderate pain.    albuterol (PROAIR HFA) 108 (90 Base) MCG/ACT inhaler Inhale 2 puffs into the lungs every 6 (six) hours as needed for wheezing or shortness of breath.   allopurinol (ZYLOPRIM) 100 MG tablet TAKE ONE TABLET BY MOUTH ONCE DAILY (Patient taking differently: Take 100 mg by mouth daily.)   apixaban (ELIQUIS) 5 MG TABS tablet Take 1 tablet (5 mg total) by mouth 2 (two) times daily.   Ascorbic Acid (VITAMIN C) 1000 MG tablet Take 1,000 mg by mouth daily.   esomeprazole (NEXIUM) 40 MG capsule TAKE ONE CAPSULE BY MOUTH ONCE DAILY   famotidine (PEPCID) 20 MG tablet TAKE ONE TABLET BY MOUTH EVERYDAY AT BEDTIME   ferrous sulfate 325 (65 FE) MG EC tablet Take 325 mg by mouth daily with breakfast.   furosemide (LASIX) 20 MG tablet Take 2 tablets (40 mg total) by mouth daily.   gabapentin (NEURONTIN) 600 MG tablet TAKE ONE TABLET BY MOUTH TWICE DAILY   HYDROcodone-acetaminophen (NORCO/VICODIN) 5-325 MG tablet Take 1 tablet by mouth every 6 (six) hours as needed for severe pain.   LORazepam (ATIVAN) 0.5 MG tablet TAKE ONE TABLET BY MOUTH EVERYDAY AT BEDTIME (Patient taking differently: Take 0.5 mg by mouth at bedtime.)   losartan (COZAAR) 25 MG tablet Take 1 tablet (25 mg total) by mouth daily.   metoprolol tartrate (LOPRESSOR) 50 MG tablet TAKE 1 AND 1/2 TABLETS BY MOUTH TWICE DAILY (Patient taking differently: Take 50 mg by mouth 2 (two) times daily.)   nitroGLYCERIN (NITROSTAT) 0.4 MG SL tablet Place 1 tablet (0.4 mg total) under the  tongue every 5 (five) minutes as needed for chest pain (x 3 pills). Reported on 09/01/2015   sertraline (ZOLOFT) 50 MG tablet TAKE ONE TABLET BY MOUTH ONCE DAILY (Patient taking differently: Take 50 mg by mouth daily.)   silver sulfADIAZINE (SILVADENE) 1 % cream Apply 1 application topically 2 (two) times daily.   Spacer/Aero-Holding Chambers (E-Z SPACER) inhaler Use as instructed   Tiotropium Bromide Monohydrate (SPIRIVA RESPIMAT) 2.5 MCG/ACT AERS Inhale 2 puffs into the lungs daily at 2 PM.   Tiotropium Bromide Monohydrate (SPIRIVA RESPIMAT) 2.5 MCG/ACT AERS Inhale 2 puffs into the lungs daily.   traZODone (DESYREL) 50 MG tablet TAKE ONE TABLET BY MOUTH EVERYDAY AT BEDTIME (Patient taking differently: Take 50 mg by mouth at bedtime.)   triamcinolone cream (KENALOG) 0.1 % Apply 1 application topically 2 (two) times daily.    umeclidinium-vilanterol (ANORO ELLIPTA) 62.5-25 MCG/INH AEPB Inhale 1 puff into the lungs daily.   No facility-administered encounter medications on file as of 01/04/2021.   Reviewed chart for medication changes ahead of medication coordination call. No OVs, Consults, or hospital visits since last care coordination call/Pharmacist visit.  No medication changes indicated OR if recent visit, treatment plan here.  BP Readings from Last 3 Encounters:  12/18/20 110/71  12/05/20 118/68  11/22/20 118/80    Lab Results  Component Value Date   HGBA1C 5.9 (H) 05/31/2014    Patient obtains medications through Adherence Packaging  90 Days  Last adherence delivery included:  Allopurinol 100 mg: one tablet by mouth daily Famotidine 20 mg: one tablet by mouth at bedtime Furosemide 20 mg: two tablets by mouth daily Gabapentin 600 mg: one tablet by mouth 2 (two) times daily Trazodone 50 mg: one tablet by mouth at bedtime Sertraline 50 mg: one tablet by mouth daily Esomeprazole 40 mg: one capsule by mouth daily Lorazepam 0.5 mg one tablet by mouth at bedtime Metoprolol Tartrate  50 mg: one and half (1.5) tablet twice a day  Patient declined the following medication last month due to PRN use/additional supply on hand. Spiriva Spr 2.5 mcg Inhale two puff my mouth at 2 pm daily. (90 DS filled 08/24/2020) Albuterol (PROAIR HFA) 108 (90 Base) MCG/ACT inhaler: two puff every 6 hours as needed. (PRN Use)  Triamcinolone cream 0.1% apply twice a day (PRN Use)  Silver Sulfadiazine 1% topical cream (PRN Use) Apixaban (ELIQUIS) 5 MG TABS tablet: one tablet by month twice daily (90 DS filled 08/10/2020) Spiriva Spr 2.5 mcg Inhale two puff my mouth at 2 pm daily. (90 DS filled 08/24/2020) Albuterol (PROAIR HFA) 108 (90 Base) MCG/ACT inhaler: two puff every 6 hours as needed. (PRN Use) Triamcinolone cream 0.1% apply twice a day (PRN Use) Losartan (COZAAR) 50 MG: one half tablet by mouth daily (30 DS filled 10/07/2020)  Patient is due for next adherence delivery on: 01/10/2021. Called patient and reviewed medications and coordinated delivery.  This delivery to include: Allopurinol 100 mg: one tablet by mouth at breakfast Famotidine 20 mg: one tablet by mouth at bedtime Furosemide 20 mg: two tablets by mouth at breakfast Gabapentin 600 mg: one tablet at breakfast and at dinner Trazodone 50 mg: one tablet by mouth at bedtime Sertraline 50 mg: one tablet by mouth daily Esomeprazole 40 mg: one capsule by mouth daily Lorazepam 0.5 mg one tablet by mouth at bedtime Metoprolol Tartrate 50 mg: one and half (1.5) tablet twice a day Eliquis 5 mg: one tablet breakfast and one at dinner Losartan 25 mg: one tablet at dinner Anoro Ellipta 62.5 mcg: one puff into lung daily  Coordinated acute fill for the following medication  to be delivered 06.24.2022. Lorazepam 0.5 mg one tablet by mouth at bedtime  Patient declined the following medications due to  Silver Sulfadiazine 1% topical cream (PRN Use) Albuterol (PROAIR HFA) 108 (90 Base) MCG/ACT inhaler: two puff every 6 hours as needed.  (PRN Use) Triamcinolone cream 0.1% apply twice a day (PRN Use)   She currently does not need refills.  Confirmed delivery date of 01/10/2021, advised patient that pharmacy will contact them the morning of delivery.  Star Rating Drugs: Medication Dispensed  Quantity Pharmacy  Losartan 50 mg  05.02.2022 [facility]    Amilia Revonda Standard, [place] Pharmacist Assistant [phone]

## 2021-01-11 ENCOUNTER — Encounter: Payer: Self-pay | Admitting: Family

## 2021-01-11 ENCOUNTER — Other Ambulatory Visit: Payer: Self-pay

## 2021-01-11 ENCOUNTER — Ambulatory Visit (INDEPENDENT_AMBULATORY_CARE_PROVIDER_SITE_OTHER): Payer: HMO | Admitting: Family

## 2021-01-11 VITALS — BP 118/70 | HR 75 | Ht 63.0 in | Wt 190.6 lb

## 2021-01-11 DIAGNOSIS — I10 Essential (primary) hypertension: Secondary | ICD-10-CM

## 2021-01-11 DIAGNOSIS — I25118 Atherosclerotic heart disease of native coronary artery with other forms of angina pectoris: Secondary | ICD-10-CM

## 2021-01-11 DIAGNOSIS — I5032 Chronic diastolic (congestive) heart failure: Secondary | ICD-10-CM

## 2021-01-11 DIAGNOSIS — I4821 Permanent atrial fibrillation: Secondary | ICD-10-CM

## 2021-01-11 DIAGNOSIS — R5383 Other fatigue: Secondary | ICD-10-CM

## 2021-01-11 MED ORDER — LOSARTAN POTASSIUM 25 MG PO TABS: 12.5000 mg | ORAL_TABLET | Freq: Every day | ORAL | 1 refills | Status: DC

## 2021-01-11 NOTE — Patient Instructions (Signed)
Medication Instructions:  Your physician has recommended you make the following change in your medication:   TODAY Take Furosemide (Lasix) 3 tablets (60 mg) then go back to 2 tablets (40mg )  CHANGE Losartan half tablet (12.5mg ) once per day  *If you need a refill on your cardiac medications before your next appointment, please call your pharmacy*   Lab Work: Your provider recommends lab work today: BMP, BNP, CBC  If you have labs (blood work) drawn today and your tests are completely normal, you will receive your results only by: [place] (if you have MyChart) OR A paper copy in the mail If you have any lab test that is abnormal or we need to change your treatment, we will call you to review the results.   Testing/Procedures: Your EKG today showed rate controlled atrial fibrillation  Follow-Up: At [facility], you and your health needs are our priority.  As part of our continuing mission to provide you with exceptional heart care, we have created designated Provider Care Teams.  These Care Teams include your primary Cardiologist (physician) and Advanced Practice Providers (APPs -  Physician Assistants and Nurse Practitioners) who all work together to provide you with the care you need, when you need it.  We recommend signing up for the patient portal called "MyChart".  Sign up information is provided on this After Visit Summary.  MyChart is used to connect with patients for Virtual Visits (Telemedicine).  Patients are able to view lab/test results, encounter notes, upcoming appointments, etc.  Non-urgent messages can be sent to your provider as well.   To learn more about what you can do with MyChart, go to [email or web address].    Your next appointment:   2 month(s)  The format for your next appointment:   In Person  Provider:   [name] see Mertie Moores, MD or one of the following Advanced Practice Providers on your designated Care Team:   Richardson Dopp, PA-C [place], [place]   Other Instructions  Recommend weighing daily and keeping a log. Please call our office if you have weight gain of 2 pounds overnight or 5 pounds in 1 week.   Date  Time Weight                                           Exercise recommendations: The American Heart Association recommends 150 minutes of moderate intensity exercise weekly. Try 30 minutes of moderate intensity exercise 4-5 times per week. This could include walking, jogging, or swimming.  Heart Healthy Diet Recommendations: A low-salt diet is recommended. Meats should be grilled, baked, or boiled. Avoid fried foods. Focus on lean protein sources like fish or chicken with vegetables and fruits. The American Heart Association is a Microbiologist!  American Heart Association Diet and Lifeystyle Recommendations

## 2021-01-11 NOTE — Progress Notes (Signed)
Office Visit    Patient Name: [name] Date of Encounter: 01/11/2021  PCP:  [name], NP   [facility]  Cardiologist:  Mertie Moores, MD  Advanced Practice Provider:  No care team member to display Electrophysiologist:  None   Chief Complaint    [name] is a 87 y.o. female with a hx of CAD s/p stenting to RCAx2 in 5732, chronic diastolic heart failure, COPD, permanent atrial fibrillation on chronic anticoagulation presents today for hospital follow up.    Past Medical History    Past Medical History:  Diagnosis Date   Adenocarcinoma, colon [facility]) Oncologist-- Dr Truitt Merle   Multifocal (2) Colon cancer @ ileocecal valve and ascending ( [id number]), Stage IIIB, grade I, MMR normal , 2 of 16 +lymph nodes, negative surgical margins---  06-02-2014  Right hemicolectomy w/ colostomy   Allergy    Anxiety    Asthma    inhaler "sometimes"   CAD (coronary artery disease)    a. LHC 4/09: pLAD 40, mDx 70-75, pLCx 65, mLCx 50, mRCA 90, EF 60% >> PCI: BMS x2 to RCA;  b. Myoview 8/12: normal   Cataract    bil cateracts removed   Chronic anemia    Chronic diastolic CHF (congestive heart failure) ([place])    a. Echo 912 - Mild LVH, EF 55-60%, no RWMA, Gr 1 DD, mild MR, mild LAE, mild RAE, PASP 59 mmHg (mod to severe pulmo HTN)   Clotting disorder (HCC)    hx of dvt, tia on eliquis   Colostomy in place [facility])    s/p colostomy takedown 5/16   Colovaginal fistula    s/p colostomy >> colostomy takedown 5/16   COPD with chronic bronchitis (HCC)    Depression    Dysrhythmia    A fib   Essential hypertension    GERD (gastroesophageal reflux disease)    History of adenomatous polyp of colon    History of blood transfusion    History of DVT of lower extremity    History of hiatal hernia    History of TIA (transient ischemic attack)    a. Head CT 6/15: small chronic lacunar infarct in thalamus   Hyperlipidemia    OA (osteoarthritis)    Osteoporosis     Peripheral neuropathy    Pneumonia    Pulmonary HTN ([place])    a. PASP on Echo in 9/12:  59 mmHg   Renal insufficiency    Sigmoid diverticulosis    s/p sigmoid colectomy   Stroke [facility])    TIAs   Wears dentures    Wears glasses    Past Surgical History:  Procedure Laterality Date   ABDOMINAL HYSTERECTOMY     ANTERIOR CERVICAL DECOMP/DISCECTOMY FUSION  01-31-2009   C5 -- 7   APPENDECTOMY     Bone spurs Bilateral    Feet   CARDIOVASCULAR STRESS TEST  02-26-2011   Normal lexiscan no exercise study/  no ischemia/  normal LV function and wall motion, ef 67%   CARPAL TUNNEL RELEASE Right    CATARACT EXTRACTION W/ INTRAOCULAR LENS  IMPLANT, BILATERAL Bilateral    CHOLECYSTECTOMY     COLOSTOMY N/A 06/02/2014   Procedure: DIVERTING DESCENDING END COLOSTOMY;  Surgeon: Donnie Mesa, MD;  Location: [place];  Service: General;  Laterality: N/A;   CORONARY ANGIOPLASTY WITH STENT PLACEMENT  11-04-2007  dr bensimhon   BMS x2 to RCA/  mild Non-obstructive disease LAD/  normal LVF   DILATION  AND CURETTAGE OF UTERUS     ESOPHAGOGASTRODUODENOSCOPY N/A 10/28/2017   Procedure: ESOPHAGOGASTRODUODENOSCOPY (EGD);  Surgeon: Yetta Flock, MD;  [facility] ENDOSCOPY;  Service: Gastroenterology;  Laterality: N/A;   EVALUATION UNDER ANESTHESIA WITH ANAL FISTULECTOMY N/A 10/13/2014   Procedure: ANAL EXAM UNDER ANESTHESIA ;  Surgeon: [name], MD;  Location: [facility];  Service: General;  Laterality: N/A;   HAND SURGERY     Tendon repair   LAPAROSCOPIC SIGMOID COLECTOMY N/A 11/24/2014   Procedure: SIGMOID COLECTOMY AND COLSOTOMY CLOSURE;  Surgeon: Donnie Mesa, MD;  Location: [place];  Service: General;  Laterality: N/A;   LUMBAR LAMINECTOMY  10-29-2002,  1969   Left  L3 -- 4  decompression   PARTIAL COLECTOMY N/A 06/02/2014   Procedure: RIGHT PARTIAL COLECTOMY ;  Surgeon: Donnie Mesa, MD;  Location: [place];  Service: General;  Laterality: N/A;   PATELLECTOMY Right 09/14/2013   Procedure: Wendi Maya;   Surgeon: Meredith Pel, MD;  Location: [place];  Service: Orthopedics;  Laterality: Right;   PROCTOSCOPY N/A 10/13/2014   Procedure: RIDGE PROCTOSCOPY;  Surgeon: [name], MD;  Location: [facility];  Service: General;  Laterality: N/A;   REVISION TOTAL KNEE ARTHROPLASTY Bilateral right  04-02-2011/  left 1996 & 10-05-1999   ROTATOR CUFF REPAIR Bilateral    TONSILLECTOMY     TOTAL KNEE ARTHROPLASTY Bilateral left 2440/  right 2000   TOTAL KNEE REVISION Left 08/02/2015   Procedure: LEFT FEMORAL REVISION;  Surgeon: Gaynelle Arabian, MD;  Location: [facility];  Service: Orthopedics;  Laterality: Left;   TRANSTHORACIC ECHOCARDIOGRAM  12-01-2009   Grade I diastolic dysfunction/  ef 60%/  moderate MR/  mild TR    Allergies  No Known Allergies  History of Present Illness    [name] is a 87 y.o. female with a hx of CAD s/p stenting to RCAx2 in 1027, chronic diastolic heart failure, COPD, permanent atrial fibrillation on chronic anticoagulation last seen 06/06/20 by Dr. Acie Fredrickson.  She was last seen in clinic by Dr. Acie Fredrickson November 2021 noting to be feeling overall weak but without cardiac complaint.  Hospitalized 11/10/20 - 11/13/20. Admitted with acute on chronic diastolic heart failure. CXR showed pulmonary edema. CTA chest unremakrable. Echo with LVEF 25-36%, diastolic function unable to be evaluated, moderately elevated PASP, mild to moderate MR, moderate to severe TR, triavial AI, interventricular septum flattened in diastole consistent with volume overload. She was diuresed.   At pulmonology visit 12/05/20 she was transitioned from Spiriva to Trelegy. She had mouth discomfort and had to discontinue. Also diagnosed with UA and started on abx which she has completed.  ED visit 12/18/20 for skin tear to leg. Xray unremarkable. No blood work was checked.  She presents today for follow up with her daughter. She notes persistent weakness. She has completed her physical therapy course after discharge and  has ben keeping up with her exercises.  She does wear compression stockings routinely. Reports mild edema. She does have some fluid blisters to bilateral lower extremities. She notes no shortness of breath at rest. Tells me her dyspnea on exertion is improving over the last couple of weeks. She is concerned her abrasion to her leg is not yet healed. She is keeping it covered. Notes no chest pain, pressure, tightness.  EKGs/Labs/Other Studies Reviewed:   The following studies were reviewed today:  EKG:  EKG is ordered today.  The ekg ordered today demonstrates Rate controlled atrial fibrillation 75 bpm.   Recent Labs: 06/06/2020: TSH  2.620 11/10/2020: ALT 17; B Natriuretic Peptide 490.6 11/12/2020: Hemoglobin 10.8; Magnesium 2.0; Platelets 111 11/13/2020: BUN 37; Creatinine, Ser 1.30; Potassium 3.7; Sodium 140  Recent Lipid Panel    Component Value Date/Time   CHOL 130 01/19/2013 1014   TRIG 87.0 01/19/2013 1014   HDL 33.60 (L) 01/19/2013 1014   CHOLHDL 4 01/19/2013 1014   VLDL 17.4 01/19/2013 1014   LDLCALC 79 01/19/2013 1014   Home Medications   Current Meds  Medication Sig   acetaminophen (TYLENOL) 500 MG tablet Take 1,000 mg by mouth every 6 (six) hours as needed for moderate pain.    albuterol (PROAIR HFA) 108 (90 Base) MCG/ACT inhaler Inhale 2 puffs into the lungs every 6 (six) hours as needed for wheezing or shortness of breath.   allopurinol (ZYLOPRIM) 100 MG tablet TAKE ONE TABLET BY MOUTH ONCE DAILY   apixaban (ELIQUIS) 5 MG TABS tablet Take 1 tablet (5 mg total) by mouth 2 (two) times daily.   Ascorbic Acid (VITAMIN C) 1000 MG tablet Take 1,000 mg by mouth daily.   esomeprazole (NEXIUM) 40 MG capsule TAKE ONE CAPSULE BY MOUTH ONCE DAILY   famotidine (PEPCID) 20 MG tablet TAKE ONE TABLET BY MOUTH EVERYDAY AT BEDTIME   ferrous sulfate 325 (65 FE) MG EC tablet Take 325 mg by mouth daily with breakfast.   furosemide (LASIX) 20 MG tablet Take 2 tablets (40 mg total) by mouth daily.    gabapentin (NEURONTIN) 600 MG tablet TAKE ONE TABLET BY MOUTH TWICE DAILY   HYDROcodone-acetaminophen (NORCO/VICODIN) 5-325 MG tablet Take 1 tablet by mouth every 6 (six) hours as needed for severe pain.   LORazepam (ATIVAN) 0.5 MG tablet TAKE ONE TABLET BY MOUTH EVERYDAY AT BEDTIME   losartan (COZAAR) 25 MG tablet Take 1 tablet (25 mg total) by mouth daily.   metoprolol tartrate (LOPRESSOR) 50 MG tablet TAKE 1 AND 1/2 TABLETS BY MOUTH TWICE DAILY   nitroGLYCERIN (NITROSTAT) 0.4 MG SL tablet Place 1 tablet (0.4 mg total) under the tongue every 5 (five) minutes as needed for chest pain (x 3 pills). Reported on 09/01/2015   sertraline (ZOLOFT) 50 MG tablet TAKE ONE TABLET BY MOUTH ONCE DAILY   silver sulfADIAZINE (SILVADENE) 1 % cream Apply 1 application topically 2 (two) times daily.   Spacer/Aero-Holding Chambers (E-Z SPACER) inhaler Use as instructed   Tiotropium Bromide Monohydrate (SPIRIVA RESPIMAT) 2.5 MCG/ACT AERS Inhale 2 puffs into the lungs daily.   traZODone (DESYREL) 50 MG tablet TAKE ONE TABLET BY MOUTH EVERYDAY AT BEDTIME   triamcinolone cream (KENALOG) 0.1 % Apply 1 application topically 2 (two) times daily.      Review of Systems   All other systems reviewed and are otherwise negative except as noted above.  Physical Exam    VS:  There were no vitals taken for this visit. , BMI There is no height or weight on file to calculate BMI.  Wt Readings from Last 3 Encounters:  12/18/20 184 lb 12.6 oz (83.8 kg)  12/05/20 184 lb 12.8 oz (83.8 kg)  11/22/20 188 lb (85.3 kg)     GEN: Well nourished, overweight, well developed, in no acute distress. HEENT: normal. Neck: Supple, no JVD, carotid bruits, or masses. Cardiac:RRR, no murmurs, rubs, or gallops. No clubbing, cyanosis. Bilateral LE with 2+ pitting edema and fluid blisters. No erythema or signs of infection.   Radials/PT 2+ and equal bilaterally.  Respiratory:  Respirations regular and unlabored, clear to auscultation  bilaterally. GI: Soft, nontender, nondistended. MS:  No deformity or atrophy. Skin: Warm and dry, no rash. Neuro:  Strength and sensation are intact. Psych: Normal affect.  Assessment & Plan    Weakness - Likely multifactorial deconditioning, hypotension, fluid overload in setting of diastolic heart failure. Workup and management, as below.   Chronic diastolic heart failure - Volume overloaded on exam. CBC, ProBNP, BMP today. She will take Lasix 60mg  today  then return to 40mg  daily. Further up-titration pending labs. GDMT includes Lasix, Metoprolol. Future considerations include addition of SGLT2i if renal function will tolerate. Low salt diet, fluid restriction, compression socks, and leg elevation encouraged. LE with edema though no signs of cellulitis. Daily weights encouraged, will report gain of 2 lb overnight or 5 lbs in 1 week.  HTN - Now with relative hypotension. Likely contributory to fatigue. Reduce Losartan to 12.30m QD.   Permanent atrial fibrillation / Chronic anticoagulation - Rate controlled today. Denies bleeding. CBC for monitoring. Continue Lopressor 75mg  BID. Continue Eliquis 5mg  BID. Does not meet dose reduction criteria.   CAD - Stable with no anginal symptoms. No indication for ischemic evaluation. EKG today no acute ST/T wave changes. GDMT includes  Iron deficiency anemia - CBC for monitoring due to recent injury to left shin while in the yard. No signs of infection.   COPD - Continue to follow with pulmonology. No sign of acute exacerbation.  UTI - Has completed abx. No recurrent symptoms.   Disposition: Follow up in 2 month(s) with Dr. Acie Fredrickson or APP.    Signed, Loel Dubonnet, NP 01/11/2021, 11:55 AM [place]

## 2021-01-11 NOTE — Patient Instructions (Signed)
Hi Ruie,  As always, it was such a pleasure catching up with you! Below is a summary of some of the topics we discussed.   Please reach out to me if you have any questions or need anything before our follow up!  Best, Maddie  Jeni Salles, PharmD, [facility]   Visit Information   Goals Addressed   None    Patient Care Plan: CCM Pharmacy Care Plan     Problem Identified: Problem: Hypertension, Hyperlipidemia, Atrial Fibrillation, Heart Failure, Coronary Artery Disease, GERD, COPD, Chronic Kidney Disease, Depression, Anxiety, Osteoarthritis, Gout, and Neuropathy, Iron deficiency anemia      Long-Range Goal: Patient-Specific Goal   Start Date: 12/21/2020  Expected End Date: 12/21/2021  This Visit's Progress: On track  Priority: High  Note:   Current Barriers:  Unable to independently monitor therapeutic efficacy Unable to maintain control of COPD  Pharmacist Clinical Goal(s):  Patient will achieve adherence to monitoring guidelines and medication adherence to achieve therapeutic efficacy through collaboration with PharmD and provider.   Interventions: 1:1 collaboration with [name], NP regarding development and update of comprehensive plan of care as evidenced by provider attestation and co-signature Inter-disciplinary care team collaboration (see longitudinal plan of care) Comprehensive medication review performed; medication list updated in electronic medical record  Hypertension (BP goal <140/90) -Controlled -Current treatment: metoprolol tartrate 50mg , 1.5 (one and one-half) tablets twice daily Losartan 50 mg 1/2 tablet daily - haven't been taking consistently -Medications previously tried: diltiazem, benazepril (cough) -Current home readings: 116/68, not often <100, 105/65, 113/57 -Current dietary habits: did not discuss -Current exercise habits: limited -Denies hypotensive/hypertensive  symptoms -Educated on Exercise goal of 150 minutes per week; Importance of home blood pressure monitoring; Proper BP monitoring technique; -Counseled to monitor BP at home weekly, document, and provide log at future appointments -Counseled on diet and exercise extensively Recommended to continue current medication Recommended taking losartan daily  Hyperlipidemia/coronary atherosclerosis: (LDL goal < 70) -Uncontrolled -Current treatment: nitroglycerin 0.4mg , 1 tablet under tongue every five minutes as needed for chest pain (x 3 pills) (last dose taken January 2021). -Medications previously tried: simvastatin (myalgias)  -Current dietary patterns: did not discuss -Current exercise habits: limited -Educated on Cholesterol goals;  Importance of limiting foods high in cholesterol; Exercise goal of 150 minutes per week; -Counseled on diet and exercise extensively Due to age and ok with cardiology to not prescribe statin therapy  Atrial Fibrillation (Goal: prevent stroke and major bleeding) -Controlled -CHADSVASC: 6 -Current treatment: Rate control: metoprolol tartrate 50mg , 1.5 (one and one-half) tablets twice daily Anticoagulation: apixaban (Eliquis) 5mg  1 tablet twice daily -Medications previously tried: none -Home BP and HR readings: refer to above  -Counseled on increased risk of stroke due to Afib and benefits of anticoagulation for stroke prevention; importance of adherence to anticoagulant exactly as prescribed; avoidance of NSAIDs due to increased bleeding risk with anticoagulants; -Counseled on diet and exercise extensively Recommended to continue current medication Assessed patient finances. Once patient reaches 3% OOP minimum, she will be eligible for PAP.  Heart Failure (Goal: manage symptoms and prevent exacerbations) -Controlled -Last ejection fraction: 60-65% (Date: 11/11/20) -HF type: Diastolic -NYHA Class: III (marked limitation of activity) -AHA HF Stage: C  (Heart disease and symptoms present) -Current treatment: metoprolol tartrate 50mg , 1.5 (one and one-half) tablets twice daily furosemide 20mg , 2 tablets once daily Losartan 50 mg 1/2 tablet daily -Medications previously tried: none  -Current home BP/HR readings: refer to above -Current dietary habits:  did not discuss -Current exercise habits: limited in ability -Educated on Importance of weighing daily; if you gain more than 3 pounds in one day or 5 pounds in one week, call cardiologist Importance of blood pressure control -Counseled on diet and exercise extensively Recommended to continue current medication  COPD (Goal: control symptoms and prevent exacerbations) -Controlled -Current treatment  albuterol (Proair HFA) 108 mcg/act inhaler, 2 puffs every six hours as needed for wheezing or shortness of breath Anoro ellipta inhale once puff once daily -Medications previously tried: Barista, Trelegy (mouth sores), Stiolto (cost), Breztri, Spiriva -Gold Grade: n/a -Current COPD Classification:  C (low sx, >/=2 exacerbations/yr) -MMRC/CAT score: 2 (07/24/20) -Pulmonary function testing: none on file -Exacerbations requiring treatment in last 6 months: none -Patient reports consistent use of maintenance inhaler -Frequency of rescue inhaler use: not even once a week -Counseled on When to use rescue inhaler Differences between maintenance and rescue inhalers -Counseled on diet and exercise extensively Recommended to continue current medication Assessed patient finances. Patient would likely qualify for patient assistance.  Depression/Anxiety (Goal: minimize symptoms) -Controlled -Current treatment: sertraline 50mg , 1 tablet once daily -Medications previously tried/failed: none -PHQ9: 0 -GAD7: n/a -Educated on Benefits of medication for symptom control -Recommended to continue current medication  Insomnia (Goal: improve quality and quantity of sleep) -Controlled -Current treatment   trazodone 50mg , 1 tablet once daily at bedtime lorazepam 0.5mg , 1 tablet twice daily as needed for anxiety or sleep (patient reports taking only at bedtime) -Medications previously tried: none  -Recommended to continue current medication Educated on risks of taking benzodiazepines long term  Osteoarthritis (Goal: minimize pain) -Controlled -Current treatment  acetaminophen (Tylenol) 500mg , 2 tablets every six hours as needed for moderate pain -Medications previously tried: none  -Recommended to continue current medication  Gout (Goal: prevent flare ups and uric acid < 6) -Not ideally controlled -Current treatment  colchicine 0.6mg  as needed Allopurinol 100 mg 1 tablet daily -Medications previously tried: none  -Recommended to continue current medication Recommended repeat uric acid level  GERD (Goal: minimize symptoms of heartburn) -Controlled -Current treatment  esomeprazole 40mg , 1 capsule once daily -Medications previously tried: none  -Recommended to continue current medication  Iron deficiency anemia (Goal: Hgb > 11) -Controlled -Current treatment  ferrous sulfate 325mg  1 tablet once daily with breakfast  -Medications previously tried: none  -Recommended to continue current medication  Neuropathy (Goal: minimize pain/tingling/numbness) -Controlled -Current treatment  gabapentin 600mg , 1 tablet twice daily -Medications previously tried: none  -Recommended to continue current medication   Health Maintenance -Vaccine gaps: shingrix, COVID booster -Current therapy:  None -Educated on Cost vs benefit of each product must be carefully weighed by individual consumer -Patient is satisfied with current therapy and denies issues -Recommended to continue as is  Patient Goals/Self-Care Activities Patient will:  - take medications as prescribed check blood pressure a few times a week, document, and provide at future appointments target a minimum of 150 minutes of  moderate intensity exercise weekly  Follow Up Plan: Telephone follow up appointment with care management team member scheduled for: 6 months       Patient verbalizes understanding of instructions provided today and agrees to view in [place].  Telephone follow up appointment with pharmacy team member scheduled for:6 months  Viona Gilmore, [facility]

## 2021-01-12 ENCOUNTER — Other Ambulatory Visit: Payer: Self-pay | Admitting: *Deleted

## 2021-01-12 DIAGNOSIS — R5383 Other fatigue: Secondary | ICD-10-CM

## 2021-01-12 DIAGNOSIS — I4821 Permanent atrial fibrillation: Secondary | ICD-10-CM

## 2021-01-12 DIAGNOSIS — I5033 Acute on chronic diastolic (congestive) heart failure: Secondary | ICD-10-CM

## 2021-01-12 LAB — BASIC METABOLIC PANEL
BUN/Creatinine Ratio: 30 — ABNORMAL HIGH (ref 12–28)
BUN: 45 mg/dL — ABNORMAL HIGH (ref 8–27)
CO2: 21 mmol/L (ref 20–29)
Calcium: 8.9 mg/dL (ref 8.7–10.3)
Chloride: 105 mmol/L (ref 96–106)
Creatinine, Ser: 1.5 mg/dL — ABNORMAL HIGH (ref 0.57–1.00)
Glucose: 95 mg/dL (ref 65–99)
Potassium: 4.3 mmol/L (ref 3.5–5.2)
Sodium: 143 mmol/L (ref 134–144)
eGFR: 33 mL/min/{1.73_m2} — ABNORMAL LOW (ref >59)

## 2021-01-12 LAB — CBC
Hematocrit: 34.5 % (ref 34.0–46.6)
Hemoglobin: 10.4 g/dL — ABNORMAL LOW (ref 11.1–15.9)
MCH: 28.2 pg (ref 26.6–33.0)
MCHC: 30.1 g/dL — ABNORMAL LOW (ref 31.5–35.7)
MCV: 94 fL (ref 79–97)
Platelets: 144 10*3/uL — ABNORMAL LOW (ref 150–450)
RBC: 3.69 x10E6/uL — ABNORMAL LOW (ref 3.77–5.28)
RDW: 13.8 % (ref 11.7–15.4)
WBC: 6.7 10*3/uL (ref 3.4–10.8)

## 2021-01-12 LAB — PRO B NATRIURETIC PEPTIDE: NT-Pro BNP: 3759 pg/mL — ABNORMAL HIGH (ref 0–738)

## 2021-01-12 MED ORDER — POTASSIUM CHLORIDE ER 10 MEQ PO TBCR: EXTENDED_RELEASE_TABLET | ORAL | 1 refills | Status: DC

## 2021-01-16 ENCOUNTER — Telehealth: Payer: Self-pay

## 2021-01-16 DIAGNOSIS — H353131 Nonexudative age-related macular degeneration, bilateral, early dry stage: Secondary | ICD-10-CM | POA: Diagnosis not present

## 2021-01-16 DIAGNOSIS — H5022 Vertical strabismus, left eye: Secondary | ICD-10-CM | POA: Diagnosis not present

## 2021-01-16 DIAGNOSIS — Z961 Presence of intraocular lens: Secondary | ICD-10-CM | POA: Diagnosis not present

## 2021-01-16 DIAGNOSIS — H5213 Myopia, bilateral: Secondary | ICD-10-CM | POA: Diagnosis not present

## 2021-01-16 DIAGNOSIS — H53433 Sector or arcuate defects, bilateral: Secondary | ICD-10-CM | POA: Diagnosis not present

## 2021-01-16 DIAGNOSIS — H04123 Dry eye syndrome of bilateral lacrimal glands: Secondary | ICD-10-CM | POA: Diagnosis not present

## 2021-01-16 NOTE — Telephone Encounter (Signed)
Received a call from [place] a pharmacist at [facility] who is calling our office to get clarification on patients rx for potasium. She states the directions state take 1 tab qd for three days but the rx is for thirty days. She wants to know if the patient needs to take the rx for thirty days or not.   I advised her that I would speak with the provider and get back to her. She voiced understanding.

## 2021-01-17 NOTE — Telephone Encounter (Signed)
Whitney Dubonnet, NP  You 20 hours ago (7:20 PM)     Potassium tablet was only for 3 days while she was on a short term increased dose of Furosemide. No indication for further repletion at this time. She is having repeat lab work later this week. I do not anticipate she will need further supplementation but we will send a new Rx if so. Apologize was written for 30 tabs.      Contacted the pharmacy to make them aware that the patient was only supposed to take the medication for three days. The pharmacist I spoke with stated they sent the medication out to the patient today. She states they have some new staff and they do not understand sometimes. She states she will call the patient and advise her to only take the potassium once a day for three days and she will credit the patients account.   She thanked me for calling.

## 2021-01-24 ENCOUNTER — Other Ambulatory Visit: Payer: HMO | Admitting: *Deleted

## 2021-01-24 ENCOUNTER — Telehealth: Payer: Self-pay | Admitting: Family

## 2021-01-24 ENCOUNTER — Other Ambulatory Visit: Payer: Self-pay

## 2021-01-24 DIAGNOSIS — I5033 Acute on chronic diastolic (congestive) heart failure: Secondary | ICD-10-CM | POA: Diagnosis not present

## 2021-01-24 DIAGNOSIS — R5383 Other fatigue: Secondary | ICD-10-CM

## 2021-01-24 NOTE — Telephone Encounter (Signed)
Pt c/o swelling: STAT is pt has developed SOB within 24 hours  If swelling, where is the swelling located? Legs are swollen and fluid is leaking from them  How much weight have you gained and in what time span? Not any  Have you gained 3 pounds in a day or 5 pounds in a week? no  Do you have a log of your daily weights (if so, list)? no  Are you currently taking a fluid pill? yes  Are you currently SOB? Always short of breath  Have you traveled recently? No-  daughter think the patient needs to be seen- Pt's daughter request that she would like for pt  to see Laurann Montana

## 2021-01-24 NOTE — Telephone Encounter (Signed)
New Message:    Daughter would like for pt to see [name] from this point on. She said she was very, very please with her service. She said if the pt need to change to the provider that [name] is helping she will be glad to do that.

## 2021-01-24 NOTE — Telephone Encounter (Signed)
Whitney Dubonnet, NP to Me  [name], Wonda Cheng, MD      4:58 PM  Lab work collected today was BMP and BNP which will help to guide additional diuresis. Will provide additional recommendations tomorrow morning when lab results return.   Whitney Dubonnet, NP   Notified the patient's daughter. Verbalized understanding.

## 2021-01-24 NOTE — Telephone Encounter (Signed)
Daughter has noticed in the last 2-3 weeks her mom has been getting more short of breath with activity. In the last ~ week to week and a half her legs have become more swollen and weeping sometimes enough that the patient has to change her pants. Her weight has fluctuated around 181-184 pounds this week. BP yesterday 113/58. No increase in salt or fluid intake noted. Wearing compression stockings daily and elevating legs. Daughter states she had follow up blood work today.

## 2021-01-24 NOTE — Telephone Encounter (Signed)
Advised daughter that Dr. Acie Fredrickson can stay her MD, she can not just follow with NP. However she can request to see Laurann Montana for follow ups at the [facility] office for patient preference. Daughter verbalized understanding.

## 2021-01-25 LAB — BASIC METABOLIC PANEL
BUN/Creatinine Ratio: 23 (ref 12–28)
BUN: 33 mg/dL — ABNORMAL HIGH (ref 8–27)
CO2: 23 mmol/L (ref 20–29)
Calcium: 9.3 mg/dL (ref 8.7–10.3)
Chloride: 103 mmol/L (ref 96–106)
Creatinine, Ser: 1.42 mg/dL — ABNORMAL HIGH (ref 0.57–1.00)
Glucose: 106 mg/dL — ABNORMAL HIGH (ref 65–99)
Potassium: 4.6 mmol/L (ref 3.5–5.2)
Sodium: 143 mmol/L (ref 134–144)
eGFR: 36 mL/min/{1.73_m2} — ABNORMAL LOW (ref >59)

## 2021-01-25 LAB — PRO B NATRIURETIC PEPTIDE: NT-Pro BNP: 2625 pg/mL — ABNORMAL HIGH (ref 0–738)

## 2021-01-25 NOTE — Telephone Encounter (Signed)
Recommendations provided on lab work results 01/25/21. Please see separate encounter.   Loel Dubonnet, NP

## 2021-01-25 NOTE — Telephone Encounter (Signed)
Loel Dubonnet, NP  01/25/2021  7:51 AM EDT      Kidney function overall stable. Normal electrolytes. BNP shows elevation in fluid volume. Recommend Lasix 80mg  daily for 2 days then Lasix 60mg  daily. Recommend repeat BMP/BNP in 1 week. Recommend follow up visit in 2-3 weeks withme (okay to use TOC appt slot if needed)  Pts daughter aware of med changes/recommendations and pt will return in 1 week for BMET/BNP.  Will scheduled follow up 2-3 weeks as recommmended.

## 2021-01-29 ENCOUNTER — Encounter: Payer: Self-pay | Admitting: Adult Health

## 2021-01-31 ENCOUNTER — Other Ambulatory Visit: Payer: Self-pay | Admitting: Adult Health

## 2021-01-31 NOTE — Telephone Encounter (Signed)
Okay for refill?    LOV 11/22/2020  Last Refill  11/01/20   30  QTY.   2 Refills

## 2021-02-01 ENCOUNTER — Other Ambulatory Visit: Payer: Self-pay

## 2021-02-01 ENCOUNTER — Other Ambulatory Visit: Payer: HMO

## 2021-02-01 DIAGNOSIS — I5033 Acute on chronic diastolic (congestive) heart failure: Secondary | ICD-10-CM | POA: Diagnosis not present

## 2021-02-02 LAB — BASIC METABOLIC PANEL
BUN/Creatinine Ratio: 25 (ref 12–28)
BUN: 38 mg/dL — ABNORMAL HIGH (ref 8–27)
CO2: 22 mmol/L (ref 20–29)
Calcium: 9.1 mg/dL (ref 8.7–10.3)
Chloride: 100 mmol/L (ref 96–106)
Creatinine, Ser: 1.54 mg/dL — ABNORMAL HIGH (ref 0.57–1.00)
Glucose: 100 mg/dL — ABNORMAL HIGH (ref 65–99)
Potassium: 4.3 mmol/L (ref 3.5–5.2)
Sodium: 138 mmol/L (ref 134–144)
eGFR: 32 mL/min/{1.73_m2} — ABNORMAL LOW (ref >59)

## 2021-02-02 LAB — PRO B NATRIURETIC PEPTIDE: NT-Pro BNP: 3607 pg/mL — ABNORMAL HIGH (ref 0–738)

## 2021-02-02 NOTE — Progress Notes (Signed)
Results given to pt. Pt verbalized understanding and thanks for the call. Also reviewed in [place].

## 2021-02-08 NOTE — Progress Notes (Signed)
Office Visit    Patient Name: [name] Date of Encounter: 02/08/2021  PCP:  [name], NP   [facility]  Cardiologist:  Mertie Moores, MD  Advanced Practice Provider:  No care team member to display Electrophysiologist:  None   Chief Complaint    [place] C Bibbee is a 87 y.o. female with a hx of CAD s/p stenting to RCAx2 in 4715, chronic diastolic heart failure, COPD, permanent atrial fibrillation on chronic anticoagulation presents today for follow up of heart failure and fatigue.  Past Medical History    Past Medical History:  Diagnosis Date   Adenocarcinoma, colon [facility]) Oncologist-- Dr Truitt Merle   Multifocal (2) Colon cancer @ ileocecal valve and ascending ( mT4N1Mx), Stage IIIB, grade I, MMR normal , 2 of 16 +lymph nodes, negative surgical margins---  06-02-2014  Right hemicolectomy w/ colostomy   Allergy    Anxiety    Asthma    inhaler "sometimes"   CAD (coronary artery disease)    a. LHC 4/09: pLAD 40, mDx 70-75, pLCx 39, mLCx 50, mRCA 90, EF 60% >> PCI: BMS x2 to RCA;  b. Myoview 8/12: normal   Cataract    bil cateracts removed   Chronic anemia    Chronic diastolic CHF (congestive heart failure) ([place])    a. Echo 912 - Mild LVH, EF 55-60%, no RWMA, Gr 1 DD, mild MR, mild LAE, mild RAE, PASP 59 mmHg (mod to severe pulmo HTN)   Clotting disorder (HCC)    hx of dvt, tia on eliquis   Colostomy in place [facility])    s/p colostomy takedown 5/16   Colovaginal fistula    s/p colostomy >> colostomy takedown 5/16   COPD with chronic bronchitis (HCC)    Depression    Dysrhythmia    A fib   Essential hypertension    GERD (gastroesophageal reflux disease)    History of adenomatous polyp of colon    History of blood transfusion    History of DVT of lower extremity    History of hiatal hernia    History of TIA (transient ischemic attack)    a. Head CT 6/15: small chronic lacunar infarct in thalamus   Hyperlipidemia    OA (osteoarthritis)     Osteoporosis    Peripheral neuropathy    Pneumonia    Pulmonary HTN ([place])    a. PASP on Echo in 9/12:  59 mmHg   Renal insufficiency    Sigmoid diverticulosis    s/p sigmoid colectomy   Stroke [facility])    TIAs   Wears dentures    Wears glasses    Past Surgical History:  Procedure Laterality Date   ABDOMINAL HYSTERECTOMY     ANTERIOR CERVICAL DECOMP/DISCECTOMY FUSION  01-31-2009   C5 -- 7   APPENDECTOMY     Bone spurs Bilateral    Feet   CARDIOVASCULAR STRESS TEST  02-26-2011   Normal lexiscan no exercise study/  no ischemia/  normal LV function and wall motion, ef 67%   CARPAL TUNNEL RELEASE Right    CATARACT EXTRACTION W/ INTRAOCULAR LENS  IMPLANT, BILATERAL Bilateral    CHOLECYSTECTOMY     COLOSTOMY N/A 06/02/2014   Procedure: DIVERTING DESCENDING END COLOSTOMY;  Surgeon: Donnie Mesa, MD;  Location: [facility];  Service: General;  Laterality: N/A;   CORONARY ANGIOPLASTY WITH STENT PLACEMENT  11-04-2007  dr bensimhon   BMS x2 to RCA/  mild Non-obstructive disease LAD/  normal LVF  DILATION AND CURETTAGE OF UTERUS     ESOPHAGOGASTRODUODENOSCOPY N/A 10/28/2017   Procedure: ESOPHAGOGASTRODUODENOSCOPY (EGD);  Surgeon: Yetta Flock, MD;  Location: [facility] ENDOSCOPY;  Service: Gastroenterology;  Laterality: N/A;   EVALUATION UNDER ANESTHESIA WITH ANAL FISTULECTOMY N/A 10/13/2014   Procedure: ANAL EXAM UNDER ANESTHESIA ;  Surgeon: [name], MD;  Location: [facility];  Service: General;  Laterality: N/A;   HAND SURGERY     Tendon repair   LAPAROSCOPIC SIGMOID COLECTOMY N/A 11/24/2014   Procedure: SIGMOID COLECTOMY AND COLSOTOMY CLOSURE;  Surgeon: Donnie Mesa, MD;  Location: [facility];  Service: General;  Laterality: N/A;   LUMBAR LAMINECTOMY  10-29-2002,  1969   Left  L3 -- 4  decompression   PARTIAL COLECTOMY N/A 06/02/2014   Procedure: RIGHT PARTIAL COLECTOMY ;  Surgeon: Donnie Mesa, MD;  Location: [facility];  Service: General;  Laterality: N/A;   PATELLECTOMY Right 09/14/2013    Procedure: Wendi Maya;  Surgeon: Meredith Pel, MD;  Location: [facility];  Service: Orthopedics;  Laterality: Right;   PROCTOSCOPY N/A 10/13/2014   Procedure: RIDGE PROCTOSCOPY;  Surgeon: [name], MD;  Location: [facility];  Service: General;  Laterality: N/A;   REVISION TOTAL KNEE ARTHROPLASTY Bilateral right  04-02-2011/  left 1996 & 10-05-1999   ROTATOR CUFF REPAIR Bilateral    TONSILLECTOMY     TOTAL KNEE ARTHROPLASTY Bilateral left 0165/  right 2000   TOTAL KNEE REVISION Left 08/02/2015   Procedure: LEFT FEMORAL REVISION;  Surgeon: Gaynelle Arabian, MD;  Location: [facility];  Service: Orthopedics;  Laterality: Left;   TRANSTHORACIC ECHOCARDIOGRAM  12-01-2009   Grade I diastolic dysfunction/  ef 60%/  moderate MR/  mild TR    Allergies  No Known Allergies  History of Present Illness    [name] is a 87 y.o. female with a hx of CAD s/p stenting to RCAx2 in 5374, chronic diastolic heart failure, COPD, permanent atrial fibrillation on chronic anticoagulation last seen 01/11/21.  She was seen in clinic by Dr. Acie Fredrickson November 2021 noting to be feeling overall weak but without cardiac complaint.  Hospitalized 11/10/20 - 11/13/20. Admitted with acute on chronic diastolic heart failure. CXR showed pulmonary edema. CTA chest unremakrable. Echo with LVEF 82-70%, diastolic function unable to be evaluated, moderately elevated PASP, mild to moderate MR, moderate to severe TR, triavial AI, interventricular septum flattened in diastole consistent with volume overload. She was diuresed.   At pulmonology visit 12/05/20 she was transitioned from Spiriva to Trelegy. She had mouth discomfort and had to discontinue. Also diagnosed with UA and started on abx which she has completed.  ED visit 12/18/20 for skin tear to leg. Xray unremarkable. No blood work was checked.  Seen in follow up 01/11/21 with her daughter. She had completed [place] therapy. She noted only mild LE edema but did have fluid blisters to lower  extremities. Due to fatigue CBC, ProBNP, BMP were ordered.  Due to hypotension Losartan reduced to 12.$RemoveBefor'5mg'GGIYNtinJYtw$  QD.  She had labs 01/24/21 showing elevated Pro-BNP 2,625. Her Lasix was increased to $RemoveBefo'80mg'DcHZHfykbYy$  QD x2 days then $RemoveB'60mg'tnlQEckS$  QD. Repeat labs 02/01/21 with Pro-BNP 3,607 and stable renal function. Her Lasix was increased to $RemoveBefo'60mg'mxQVCUkzPfi$  BID x 3 days then return to $RemoveB'60mg'qYIihsDu$  QD. Follow up appointment was scheduled.  Presents today for follow up with her daughter.  Tells me her leg swelling has improved dramatically since increased dose of Lasix.  She notes mild urinary frequency with this is overall not bothersome.  Her legs are no  longer swollen but do itch.  No rash present. Tells me her energy level has been improving as she gets fluid off.   EKGs/Labs/Other Studies Reviewed:   The following studies were reviewed today:  EKG:  No EKG is ordered today.  The ekg independently reviewed from 01/11/21 demonstrated rate controlled atrial fibrillation 75 bpm.   Recent Labs: 06/06/2020: TSH 2.620 11/10/2020: ALT 17; B Natriuretic Peptide 490.6 11/12/2020: Magnesium 2.0 01/11/2021: Hemoglobin 10.4; Platelets 144 02/01/2021: BUN 38; Creatinine, Ser 1.54; NT-Pro BNP 3,607; Potassium 4.3; Sodium 138  Recent Lipid Panel    Component Value Date/Time   CHOL 130 01/19/2013 1014   TRIG 87.0 01/19/2013 1014   HDL 33.60 (L) 01/19/2013 1014   CHOLHDL 4 01/19/2013 1014   VLDL 17.4 01/19/2013 1014   LDLCALC 79 01/19/2013 1014   Home Medications   No outpatient medications have been marked as taking for the 02/09/21 encounter (Appointment) with Loel Dubonnet, NP.     Review of Systems   All other systems reviewed and are otherwise negative except as noted above.  Physical Exam    VS:  There were no vitals taken for this visit. , BMI There is no height or weight on file to calculate BMI.  Wt Readings from Last 3 Encounters:  01/11/21 190 lb 9.6 oz (86.5 kg)  12/18/20 184 lb 12.6 oz (83.8 kg)  12/05/20 184 lb 12.8 oz (83.8  kg)     GEN: Well nourished, overweight, well developed, in no acute distress. HEENT: normal. Neck: Supple, no JVD, carotid bruits, or masses. Cardiac:RRR, no murmurs, rubs, or gallops. No clubbing, cyanosis. Bilateral LE with nonpitting edema. No erythema or signs of infection.   Radials/PT 2+ and equal bilaterally.  Respiratory:  Respirations regular and unlabored, clear to auscultation bilaterally. GI: Soft, nontender, nondistended. MS: No deformity or atrophy. Skin: Warm and dry, no rash. Neuro:  Strength and sensation are intact. Psych: Normal affect.  Assessment & Plan    Weakness - Likely multifactorial deconditioning, hypotension, fluid overload in setting of diastolic heart failure. Workup and management, as below.  This has improved with improvement in her volume status.  She was provided a list of chair exercises to do.  She recently completed physical therapy and she was encouraged to continue those exercises as well.  Chronic diastolic heart failure -  GDMT includes Lasix, Metoprolol.  Continue Lasix 60 mg daily with additional 60 mg as needed for weight gain of 2 pounds overnight or 5 pounds in 1 week.  Future considerations include addition of SGLT2i though renal function will not tolerate at this time.  Low salt diet, fluid restriction, compression socks, and leg elevation encouraged. Daily weights encouraged, will report gain of 2 lb overnight or 5 lbs in 1 week.  HTN -BP improved since reducing dose of losartan.  She will continue losartan 12.5 mg daily.  If she notes recurrent hypotension at home or symptomatic lightheadedness she will contact our office and we will consider discontinuing.  Permanent atrial fibrillation / Chronic anticoagulation -  Continue Lopressor 75mg  BID. Continue Eliquis 5mg  BID. Does not meet dose reduction criteria.   CAD - Stable with no anginal symptoms. No indication for ischemic evaluation. EKG today no acute ST/T wave changes. GDMT  includes  Iron deficiency anemia -01/11/2021 hemoglobin 10.4.  Recommend dietary sources of iron.  COPD - Continue to follow with pulmonology. No sign of acute exacerbation.  Likely contributory to dyspnea.  Disposition: Follow up in 1 month with Dr.  [name] as scheduled  Signed, Loel Dubonnet, NP 02/08/2021, 8:54 AM [place]

## 2021-02-09 ENCOUNTER — Telehealth: Payer: Self-pay | Admitting: Pharmacist

## 2021-02-09 ENCOUNTER — Ambulatory Visit (HOSPITAL_BASED_OUTPATIENT_CLINIC_OR_DEPARTMENT_OTHER): Payer: HMO | Admitting: Family

## 2021-02-09 ENCOUNTER — Other Ambulatory Visit: Payer: Self-pay

## 2021-02-09 ENCOUNTER — Encounter (HOSPITAL_BASED_OUTPATIENT_CLINIC_OR_DEPARTMENT_OTHER): Payer: Self-pay | Admitting: Family

## 2021-02-09 VITALS — BP 126/91 | HR 93 | Ht 63.0 in | Wt 187.0 lb

## 2021-02-09 DIAGNOSIS — Z7901 Long term (current) use of anticoagulants: Secondary | ICD-10-CM | POA: Diagnosis not present

## 2021-02-09 DIAGNOSIS — I4821 Permanent atrial fibrillation: Secondary | ICD-10-CM

## 2021-02-09 DIAGNOSIS — I5032 Chronic diastolic (congestive) heart failure: Secondary | ICD-10-CM

## 2021-02-09 DIAGNOSIS — I25118 Atherosclerotic heart disease of native coronary artery with other forms of angina pectoris: Secondary | ICD-10-CM | POA: Diagnosis not present

## 2021-02-09 MED ORDER — FUROSEMIDE 20 MG PO TABS: ORAL_TABLET | ORAL | 2 refills | Status: DC

## 2021-02-09 NOTE — Patient Instructions (Signed)
Medication Instructions:  Your physician has recommended you make the following change in your medication:   CHANGE Furosemide to 93m daily with an additional tablet as needed for weight gain of 2 pounds overnight or 5 pounds in 1 week   *If you need a refill on your cardiac medications before your next appointment, please call your pharmacy*   Lab Work: None ordered today  Testing/Procedures: None ordered today   Follow-Up: [facility] you and your health needs are our priority.  As part of our continuing mission to provide you with exceptional heart care, we have created designated Provider Care Teams.  These Care Teams include your primary Cardiologist (physician) and Advanced Practice Providers (APPs -  Physician Assistants and Nurse Practitioners) who all work together to provide you with the care you need, when you need it.  We recommend signing up for the patient portal called "MyChart".  Sign up information is provided on this After Visit Summary.  MyChart is used to connect with patients for Virtual Visits (Telemedicine).  Patients are able to view lab/test results, encounter notes, upcoming appointments, etc.  Non-urgent messages can be sent to your provider as well.   To learn more about what you can do with MyChart, go to [email or web address]    Your next appointment:   As scheduled with Dr. NAcie Fredrickson  Other Instructions  Exercises to do While Sitting  Exercises that you do while sitting (chair exercises) can give you many of the same benefits as full exercise. Benefits include strengthening your heart, burning calories, and keeping muscles and joints healthy. Exercise can also improve your mood and help with depression andanxiety. You Lemke benefit from chair exercises if you are unable to do standing exercises because of: Diabetic foot pain. Obesity. Illness. Arthritis. Recovery from surgery or injury. Breathing problems. Balance problems. Another type of  disability. Before starting chair exercises, check with your health care provider or a physical therapist to find out how much exercise you can tolerate and which exercises are safe for you. If your health care provider approves: Start out slowly and build up over time. Aim to work up to about 10-20 minutes for each exercise session. Make exercise part of your daily routine. Drink water when you exercise. Do not wait until you are thirsty. Drink every 10-15 minutes. Stop exercising right away if you have pain, nausea, shortness of breath, or dizziness. If you are exercising in a wheelchair, make sure to lock the wheels. Ask your health care provider whether you can do tai chi or yoga. Many positions in these mind-body exercises can be modified to do while seated. Warm-up Before starting other exercises: Sit up as straight as you can. Have your knees bent at 90 degrees, which is the shape of the capital letter "L." Keep your feet flat on the floor. Sit at the front edge of your chair, if you can. Pull in (tighten) the muscles in your abdomen and stretch your spine and neck as straight as you can. Hold this position for a few minutes. Breathe in and out evenly. Try to concentrate on your breathing, and relax your mind. Stretching Exercise A: Arm stretch Hold your arms out straight in front of your body. Bend your hands at the wrist with your fingers pointing up, as if signaling someone to stop. Notice the slight tension in your forearms as you hold the position. Keeping your arms out and your hands bent, rotate your hands outward as far as you can  and hold this stretch. Aim to have your thumbs pointing up and your pinkie fingers pointing down. Slowly repeat arm stretches for one minute as tolerated. Exercise B: Leg stretch If you can move your legs, try to "draw" letters on the floor with the toes of your foot. Write your name with one foot. Write your name with the toes of your other  foot. Slowly repeat the movements for one minute as tolerated. Exercise C: Reach for the sky Reach your hands as far over your head as you can to stretch your spine. Move your hands and arms as if you are climbing a rope. Slowly repeat the movements for one minute as tolerated. Range of motion exercises Exercise A: Shoulder roll Let your arms hang loosely at your sides. Lift just your shoulders up toward your ears, then let them relax back down. When your shoulders feel loose, rotate your shoulders in backward and forward circles. Do shoulder rolls slowly for one minute as tolerated. Exercise B: March in place As if you are marching, pump your arms and lift your legs up and down. Lift your knees as high as you can. If you are unable to lift your knees, just pump your arms and move your ankles and feet up and down. March in place for one minute as tolerated. Exercise C: Seated jumping jacks Let your arms hang down straight. Keeping your arms straight, lift them up over your head. Aim to point your fingers to the ceiling. While you lift your arms, straighten your legs and slide your heels along the floor to your sides, as wide as you can. As you bring your arms back down to your sides, slide your legs back together. If you are unable to use your legs, just move your arms. Slowly repeat seated jumping jacks for one minute as tolerated. Strengthening exercises Exercise A: Shoulder squeeze Hold your arms straight out from your body to your sides, with your elbows bent and your fists pointed at the ceiling. Keeping your arms in the bent position, move them forward so your elbows and forearms meet in front of your face. Open your arms back out as wide as you can with your elbows still bent, until you feel your shoulder blades squeezing together. Hold for 5 seconds. Slowly repeat the movements forward and backward for one minute as tolerated. Contact a health care provider if you: Had to stop  exercising due to any of the following: Pain. Nausea. Shortness of breath. Dizziness. Fatigue. Have significant pain or soreness after exercising. Get help right away if you have: Chest pain. Difficulty breathing. These symptoms Ding represent a serious problem that is an emergency. Do not wait to see if the symptoms will go away. Get medical help right away. Call your local emergency services (911 in the [place] Do not drive yourself to the hospital. This information is not intended to replace advice given to you by your health care provider. Make sure you discuss any questions you have with your healthcare provider. Document Revised: 10/11/2019 Document Reviewed: 10/28/2019 Elsevier Patient Education  2022 [name].

## 2021-02-09 NOTE — Progress Notes (Signed)
02-09-21 Patient assistance form for Eliquis filled out and uploaded to patient folder Per Jeni Salles.  [facility] Clinical Pharmacist Assistant [phone]

## 2021-02-09 NOTE — Telephone Encounter (Signed)
Patient called to coordinate a refill for her lorazepam from [facility] as she is out. She also requested for her new prescription for furosemide to be sent out.   Patient also cannot afford to pay for Eliquis anymore and believes she met the 3% OOP requirement for patient assistance. Will plan to reapply for patient assistance and will set aside a 30 days supply for her daughter to pick up from the office this week along with the patient assistance application.

## 2021-02-12 ENCOUNTER — Other Ambulatory Visit: Payer: Self-pay

## 2021-02-12 MED ORDER — APIXABAN 5 MG PO TABS: 5.0000 mg | ORAL_TABLET | Freq: Two times a day (BID) | ORAL | 0 refills | Status: DC

## 2021-02-26 ENCOUNTER — Telehealth: Payer: Self-pay | Admitting: Pharmacist

## 2021-02-26 NOTE — Chronic Care Management (AMB) (Signed)
Chronic Care Management Pharmacy Assistant   Name: [name]  MRN: [id number] DOB: 1929-12-26  Reason for Encounter: Disease State COPD Assessment Call    Conditions to be addressed/monitored: COPD  Recent office visits:  None  Recent consult visits:  02-09-2021 Loel Dubonnet, NP (Cardiology) - Patient presented for Chronic diastolic heart failure and other concerns. Changed Furosemide to 40 mg daily.   01-11-2021  Loel Dubonnet, NP (Cardiology) - Patient presented for Chronic diastolic heart failure and other concerns. Decreased Losartan to 12.5 mg [facility] visits: Medication Reconciliation was completed by comparing discharge summary, patient's EMR and Pharmacy list, and upon discussion with patient.  Presented to [place] ED on 12-18-2020 due to skin tear lower left leg. Patient was there for 1 hour.  New?Medications Started at [facility] Discharge:?? -started  None  Medication Changes at Hospital Discharge: -Changed  None Medications Discontinued at Hospital Discharge: -Stopped None Medications that remain the same after Hospital Discharge:??  -All other medications will remain the same.    Medications: Outpatient Encounter Medications as of 02/26/2021  Medication Sig   acetaminophen (TYLENOL) 500 MG tablet Take 1,000 mg by mouth every 6 (six) hours as needed for moderate pain.    albuterol (PROAIR HFA) 108 (90 Base) MCG/ACT inhaler Inhale 2 puffs into the lungs every 6 (six) hours as needed for wheezing or shortness of breath.   allopurinol (ZYLOPRIM) 100 MG tablet TAKE ONE TABLET BY MOUTH ONCE DAILY   apixaban (ELIQUIS) 5 MG TABS tablet Take 1 tablet (5 mg total) by mouth 2 (two) times daily.   apixaban (ELIQUIS) 5 MG TABS tablet Take 1 tablet (5 mg total) by mouth 2 (two) times daily.   Ascorbic Acid (VITAMIN C) 1000 MG tablet Take 1,000 mg by mouth daily.   esomeprazole (NEXIUM) 40 MG capsule TAKE ONE CAPSULE BY  MOUTH ONCE DAILY   famotidine (PEPCID) 20 MG tablet TAKE ONE TABLET BY MOUTH EVERYDAY AT BEDTIME   ferrous sulfate 325 (65 FE) MG EC tablet Take 325 mg by mouth daily with breakfast.   furosemide (LASIX) 20 MG tablet Take three tablets ('60mg'$ ) once per day. Take and additional 3 tablets ('60mg'$ ) as needed for weight gain of 2 lbs overnight or 5 lbs in one week.   gabapentin (NEURONTIN) 600 MG tablet TAKE ONE TABLET BY MOUTH TWICE DAILY   HYDROcodone-acetaminophen (NORCO/VICODIN) 5-325 MG tablet Take 1 tablet by mouth every 6 (six) hours as needed for severe pain.   LORazepam (ATIVAN) 0.5 MG tablet TAKE ONE TABLET BY MOUTH EVERYDAY AT BEDTIME   losartan (COZAAR) 25 MG tablet Take 0.5 tablets (12.5 mg total) by mouth daily.   metoprolol tartrate (LOPRESSOR) 50 MG tablet TAKE 1 AND 1/2 TABLETS BY MOUTH TWICE DAILY   nitroGLYCERIN (NITROSTAT) 0.4 MG SL tablet Place 1 tablet (0.4 mg total) under the tongue every 5 (five) minutes as needed for chest pain (x 3 pills). Reported on 09/01/2015   potassium chloride (KLOR-CON) 10 MEQ tablet Take 1 tab for 3 days   sertraline (ZOLOFT) 50 MG tablet TAKE ONE TABLET BY MOUTH ONCE DAILY   silver sulfADIAZINE (SILVADENE) 1 % cream Apply 1 application topically 2 (two) times daily.   Spacer/Aero-Holding Chambers (E-Z SPACER) inhaler Use as instructed   Tiotropium Bromide Monohydrate (SPIRIVA RESPIMAT) 2.5 MCG/ACT AERS Inhale 2 puffs into the lungs daily at 2 PM.   traZODone (DESYREL) 50 MG tablet TAKE ONE TABLET BY MOUTH EVERYDAY AT  BEDTIME   triamcinolone cream (KENALOG) 0.1 % Apply 1 application topically 2 (two) times daily.    No facility-administered encounter medications on file as of 02/26/2021.  Current COPD regimen:  albuterol (Proair HFA) 108 mcg/act inhaler, 2 puffs every six hours as needed for wheezing or shortness of breath Anoro ellipta inhale once puff once daily Any recent hospitalizations or ED visits since last visit with CPP? No Reports COPD  symptoms, including Increased shortness of breath , Symptoms worse at night, and Wheezing What recent interventions/DTPs have been made by any provider to improve breathing since last visit: Patient reports none. Have you had exacerbation/flare-up since last visit? Yes What do you do when you are short of breath?  Rescue medication Albuterol Inhaler  Respiratory Devices/Equipment Do you have a nebulizer? No Do you use a Peak Flow Meter? No Do you use a maintenance inhaler? Yes How often do you forget to use your daily inhaler? Only uses when needed Do you use a rescue inhaler? Yes How often do you use your rescue inhaler?  Patient reports the past 2 weeks she has used it daily ran out 3 days ago Do you use a spacer with your inhaler? No  Adherence Review: Does the patient have >5 day gap between last estimated fill date for maintenance inhaler medications? Yes    ALBUTEROL SULFATE HFA 108MCG/ACT AEROSOL SOLUTION  Anoro Ellipta 62.5 mcg-25 mcg/actuation powder for inhalation 01/11/2021 30   furosemide 20 mg tablet 01/11/2021 90    Notes: 02-28-2021 2nd attempt to reach spoke to [place]( listed as pts cell) she reports she will have her mother call me and noted she has had a really bad cough for about 10 days, says it began with indigestion and burning in her throat and stomach, she states she is put of town at the moment.   Patient returned my call she has a bad cough and confirmed all the above her daughter stated. She states she ran out of her Albuterol about 3 days ago and is in need of it. She has 15 days left on her Anoro Ellipta and does not have another. She reports she also had been taking her Fuorsemide 3x daily and has 1 pills will need more of that as well. Completed Acute form for pharmacy delivery forwaded to CPP for review.  Care Gaps: AWV - Scheduled 05-30-2021 CCM F/U Call - Scheduled 06-29-2021 12 p Zoster Vaccine - Overdue COVID Booster #3 [employer]) - Overdue Flu  Vaccine - Overdue  Star Rating Drugs: Losartan (Cozaar) 25 mg - Last filled 01-11-2021 90 DS at [place] Pharmacist Assistant [phone]

## 2021-02-28 ENCOUNTER — Telehealth: Payer: Self-pay | Admitting: Pulmonary Disease

## 2021-02-28 MED ORDER — ALBUTEROL SULFATE HFA 108 (90 BASE) MCG/ACT IN AERS: 1.0000 | INHALATION_SPRAY | Freq: Four times a day (QID) | RESPIRATORY_TRACT | 6 refills | Status: DC | PRN

## 2021-02-28 NOTE — Telephone Encounter (Signed)
Refill of the proair has been sent to the pharmacy per pts request.  Nothing further is needed.

## 2021-02-28 NOTE — Progress Notes (Addendum)
02-28-2021 Per [facility]PP Call to Dr Cordelia Pen office  [phone] to see if script for albuterol can be sent over to [facility]. Spoke to shantel she advised she will send it over. Pharmacy updated.  [facility] Clinical Pharmacist Assistant [phone]

## 2021-03-01 ENCOUNTER — Other Ambulatory Visit: Payer: Self-pay

## 2021-03-01 ENCOUNTER — Ambulatory Visit (INDEPENDENT_AMBULATORY_CARE_PROVIDER_SITE_OTHER): Payer: HMO | Admitting: Orthopedic Surgery

## 2021-03-01 ENCOUNTER — Encounter: Payer: Self-pay | Admitting: Orthopedic Surgery

## 2021-03-01 DIAGNOSIS — M19011 Primary osteoarthritis, right shoulder: Secondary | ICD-10-CM | POA: Diagnosis not present

## 2021-03-01 DIAGNOSIS — M19012 Primary osteoarthritis, left shoulder: Secondary | ICD-10-CM | POA: Diagnosis not present

## 2021-03-01 MED ORDER — BUPIVACAINE HCL 0.5 % IJ SOLN
9.0000 mL | INTRAMUSCULAR | Status: AC | PRN
  Administered 2021-03-01: 9 mL via INTRA_ARTICULAR

## 2021-03-01 MED ORDER — METHYLPREDNISOLONE ACETATE 40 MG/ML IJ SUSP
40.0000 mg | INTRAMUSCULAR | Status: AC | PRN
  Administered 2021-03-01: 40 mg via INTRA_ARTICULAR

## 2021-03-01 MED ORDER — LIDOCAINE HCL 1 % IJ SOLN
5.0000 mL | INTRAMUSCULAR | Status: AC | PRN
  Administered 2021-03-01: 5 mL

## 2021-03-01 NOTE — Progress Notes (Signed)
Office Visit Note   Patient: [name]           Date of Birth: [birth year]           MRN: [id number] Visit Date: 03/01/2021 Requested by: [name], NP [place],  [place] [id number] PCP: [name], NP  Subjective: Chief Complaint  Patient presents with   Left Shoulder - Pain   Right Shoulder - Pain    HPI: [name] is an 87 year old patient with bilateral shoulder pain.  All radiographs were reviewed.  She does have fairly significant glenohumeral joint arthritis in both shoulders.  Has a history of prior rotator cuff repairs many years ago in both shoulders.  She reports a lot of pain.  She also has a history of neck surgery and has some radicular component to the pain as well.              ROS: All systems reviewed are negative as they relate to the chief complaint within the history of present illness.  Patient denies  fevers or chills.   Assessment & Plan: Visit Diagnoses:  1. Bilateral shoulder region arthritis     Plan: Impression is bilateral shoulder arthritis.  Right is worse than the left.  Both have glenohumeral joint arthritis with reasonable maintenance of acromiohumeral interval.  Plan is bilateral shoulder injections today per her request.  3 injections per shoulder per year max.  Follow-up as needed.  Would like her to try to keep as much motion as possible in the shoulder but it is getting a little bit more painful as the right shoulder especially is getting more stiff  Follow-Up Instructions: Return if symptoms worsen or fail to improve.   Orders:  No orders of the defined types were placed in this encounter.  No orders of the defined types were placed in this encounter.     Procedures: Large Joint Inj: R glenohumeral on 03/01/2021 10:10 PM Indications: diagnostic evaluation and pain Details: 18 G 1.5 in needle, posterior approach  Arthrogram: No  Medications: 9 mL bupivacaine 0.5 %; 40 mg methylPREDNISolone acetate 40 MG/ML;  5 mL lidocaine 1 % Outcome: tolerated well, no immediate complications Procedure, treatment alternatives, risks and benefits explained, specific risks discussed. Consent was given by the patient. Immediately prior to procedure a time out was called to verify the correct patient, procedure, equipment, support staff and site/side marked as required. Patient was prepped and draped in the usual sterile fashion.    Large Joint Inj: L glenohumeral on 03/01/2021 10:10 PM Indications: diagnostic evaluation and pain Details: 18 G 1.5 in needle, posterior approach  Arthrogram: No  Medications: 9 mL bupivacaine 0.5 %; 40 mg methylPREDNISolone acetate 40 MG/ML; 5 mL lidocaine 1 % Outcome: tolerated well, no immediate complications Procedure, treatment alternatives, risks and benefits explained, specific risks discussed. Consent was given by the patient. Immediately prior to procedure a time out was called to verify the correct patient, procedure, equipment, support staff and site/side marked as required. Patient was prepped and draped in the usual sterile fashion.      Clinical Data: No additional findings.  Objective: Vital Signs: There were no vitals taken for this visit.  Physical Exam:   Constitutional: Patient appears well-developed HEENT:  Head: Normocephalic Eyes:EOM are normal Neck: Normal range of motion Cardiovascular: Normal rate Pulmonary/chest: Effort normal Neurologic: Patient is alert Skin: Skin is warm Psychiatric: Patient has normal mood and affect   Ortho Exam: Ortho exam demonstrates full active  and passive range of motion of the wrist and elbows.  Right shoulder has passive range of motion of 20/50/70.  Left shoulder passive range of motion is 35/80/110.  Motor sensory function to the hand is intact.  Well-healed rotator cuff repair incisions are present.  No discrete AC joint tenderness is present.  Rotator cuff strength is actually pretty reasonable in both shoulders  but there is some coarseness consistent with known diagnosis of arthritis in the shoulders.  Specialty Comments:  No specialty comments available.  Imaging: No results found.   PMFS History: Patient Active Problem List   Diagnosis Date Noted   CHF exacerbation ([place]) 11/10/2020   Mediastinal adenopathy 01/21/2020   Cellulitis 08/03/2019   CKD (chronic kidney disease), stage III ([place]) 08/03/2019   Pulmonary emphysema ([place]) 04/06/2019   Gastric ulcer    Thrombocytopenia ([place]) 10/27/2017   Abnormal stress test 10/27/2017   Moderate mitral regurgitation 10/27/2017   CAP (community acquired pneumonia) 10/26/2017   Displaced fracture of lateral malleolus of right fibula, subsequent encounter for closed fracture with routine healing 12/27/2016   Iron deficiency anemia 11/12/2016   Chronic anticoagulation 11/12/2016   Chronic atrial fibrillation ([place]) 08/05/2016   Coronary artery disease of native artery of native heart with stable angina pectoris ([place]) 08/05/2016   Failed total left knee replacement ([place]) 08/02/2015   Sigmoid diverticulitis 11/24/2014   Neuropathic pain 06/13/2014   Insomnia 06/13/2014   Anxiety and depression 06/13/2014   Cancer of ascending colon ([place]) 06/13/2014   Colovaginal fistula 04/20/2014   Lethargy 09/15/2013   Chest pain, musculoskeletal 02/19/2011   Cough 03/14/2010   Depression 02/07/2010   Dysuria 11/08/2009   Chronic diastolic CHF (congestive heart failure) ([place]) 02/27/2009   Pure hypercholesterolemia 04/05/2008   Coronary atherosclerosis 04/05/2008   Osteoarthritis 05/19/2007   Essential hypertension 01/21/2007   GERD 01/21/2007   Hiatal hernia 01/21/2007   Diverticulosis of large intestine 01/21/2007   COLONIC POLYPS, HX OF 01/21/2007   Past Medical History:  Diagnosis Date   Adenocarcinoma, colon ([place]) Oncologist-- Dr Truitt Merle   Multifocal (2) Colon cancer @ ileocecal valve and ascending ( [id number]), Stage IIIB, grade I, MMR normal , 2 of  16 +lymph nodes, negative surgical margins---  06-02-2014  Right hemicolectomy w/ colostomy   Allergy    Anxiety    Asthma    inhaler "sometimes"   CAD (coronary artery disease)    a. LHC 4/09: pLAD 40, mDx 70-75, pLCx 25, mLCx 50, mRCA 90, EF 60% >> PCI: BMS x2 to RCA;  b. Myoview 8/12: normal   Cataract    bil cateracts removed   Chronic anemia    Chronic diastolic CHF (congestive heart failure) ([place])    a. Echo 912 - Mild LVH, EF 55-60%, no RWMA, Gr 1 DD, mild MR, mild LAE, mild RAE, PASP 59 mmHg (mod to severe pulmo HTN)   Clotting disorder (HCC)    hx of dvt, tia on eliquis   Colostomy in place [facility])    s/p colostomy takedown 5/16   Colovaginal fistula    s/p colostomy >> colostomy takedown 5/16   COPD with chronic bronchitis (HCC)    Depression    Dysrhythmia    A fib   Essential hypertension    GERD (gastroesophageal reflux disease)    History of adenomatous polyp of colon    History of blood transfusion    History of DVT of lower extremity    History of hiatal hernia    History  of TIA (transient ischemic attack)    a. Head CT 6/15: small chronic lacunar infarct in thalamus   Hyperlipidemia    OA (osteoarthritis)    Osteoporosis    Peripheral neuropathy    Pneumonia    Pulmonary HTN ([place])    a. PASP on Echo in 9/12:  59 mmHg   Renal insufficiency    Sigmoid diverticulosis    s/p sigmoid colectomy   Stroke [facility])    TIAs   Wears dentures    Wears glasses     Family History  Problem Relation Age of Onset   Osteoarthritis Mother    Heart failure Mother    Colon cancer Maternal Uncle 60   Heart Problems Father    Cancer Sister 62       ? colon cancer    Kidney cancer Brother 30   Esophageal cancer Neg Hx    Rectal cancer Neg Hx    Stomach cancer Neg Hx    Pancreatic cancer Neg Hx     Past Surgical History:  Procedure Laterality Date   ABDOMINAL HYSTERECTOMY     ANTERIOR CERVICAL DECOMP/DISCECTOMY FUSION  01-31-2009   C5 -- 7   APPENDECTOMY     Bone  spurs Bilateral    Feet   CARDIOVASCULAR STRESS TEST  02-26-2011   Normal lexiscan no exercise study/  no ischemia/  normal LV function and wall motion, ef 67%   CARPAL TUNNEL RELEASE Right    CATARACT EXTRACTION W/ INTRAOCULAR LENS  IMPLANT, BILATERAL Bilateral    CHOLECYSTECTOMY     COLOSTOMY N/A 06/02/2014   Procedure: DIVERTING DESCENDING END COLOSTOMY;  Surgeon: Donnie Mesa, MD;  Location: [place];  Service: General;  Laterality: N/A;   CORONARY ANGIOPLASTY WITH STENT PLACEMENT  11-04-2007  dr bensimhon   BMS x2 to RCA/  mild Non-obstructive disease LAD/  normal LVF   DILATION AND CURETTAGE OF UTERUS     ESOPHAGOGASTRODUODENOSCOPY N/A 10/28/2017   Procedure: ESOPHAGOGASTRODUODENOSCOPY (EGD);  Surgeon: Yetta Flock, MD;  Location: [facility] ENDOSCOPY;  Service: Gastroenterology;  Laterality: N/A;   EVALUATION UNDER ANESTHESIA WITH ANAL FISTULECTOMY N/A 10/13/2014   Procedure: ANAL EXAM UNDER ANESTHESIA ;  Surgeon: [name], MD;  Location: [facility];  Service: General;  Laterality: N/A;   HAND SURGERY     Tendon repair   LAPAROSCOPIC SIGMOID COLECTOMY N/A 11/24/2014   Procedure: SIGMOID COLECTOMY AND COLSOTOMY CLOSURE;  Surgeon: Donnie Mesa, MD;  Location: [place];  Service: General;  Laterality: N/A;   LUMBAR LAMINECTOMY  10-29-2002,  1969   Left  L3 -- 4  decompression   PARTIAL COLECTOMY N/A 06/02/2014   Procedure: RIGHT PARTIAL COLECTOMY ;  Surgeon: Donnie Mesa, MD;  Location: [place];  Service: General;  Laterality: N/A;   PATELLECTOMY Right 09/14/2013   Procedure: Wendi Maya;  Surgeon: Meredith Pel, MD;  Location: [place];  Service: Orthopedics;  Laterality: Right;   PROCTOSCOPY N/A 10/13/2014   Procedure: RIDGE PROCTOSCOPY;  Surgeon: [name], MD;  Location: [facility];  Service: General;  Laterality: N/A;   REVISION TOTAL KNEE ARTHROPLASTY Bilateral right  04-02-2011/  left 1996 & 10-05-1999   ROTATOR CUFF REPAIR Bilateral    TONSILLECTOMY     TOTAL KNEE ARTHROPLASTY  Bilateral left 6168/  right 2000   TOTAL KNEE REVISION Left 08/02/2015   Procedure: LEFT FEMORAL REVISION;  Surgeon: Gaynelle Arabian, MD;  Location: [facility];  Service: Orthopedics;  Laterality: Left;   TRANSTHORACIC ECHOCARDIOGRAM  12-01-2009   Grade  I diastolic dysfunction/  ef 60%/  moderate MR/  mild TR   Social History   Occupational History   Not on file  Tobacco Use   Smoking status: Former    Packs/day: 0.50    Years: 16.00    Pack years: 8.00    Types: Cigarettes    Start date: 39    Quit date: 07/16/1967    Years since quitting: 53.6   Smokeless tobacco: Never  Vaping Use   Vaping Use: Never used  Substance and Sexual Activity   Alcohol use: No    Alcohol/week: 0.0 standard drinks   Drug use: No   Sexual activity: Not on file

## 2021-03-06 ENCOUNTER — Telehealth: Payer: Self-pay | Admitting: Pharmacist

## 2021-03-06 NOTE — Progress Notes (Signed)
    Chronic Care Management Pharmacy Assistant   Name: [name]  MRN: [id number] DOB: 07-Mar-1930   Reason for Encounter: Patient Assistance Documentation   Called to check on status of Patient Assistance Form sent in on 02-09-21 Spoke to [place] who noted patient was approved  on 02-22-21 and will need to renew on 06-14-21  Medications: Outpatient Encounter Medications as of 03/06/2021  Medication Sig   acetaminophen (TYLENOL) 500 MG tablet Take 1,000 mg by mouth every 6 (six) hours as needed for moderate pain.    albuterol (PROAIR HFA) 108 (90 Base) MCG/ACT inhaler Inhale 1-2 puffs into the lungs every 6 (six) hours as needed for wheezing or shortness of breath.   allopurinol (ZYLOPRIM) 100 MG tablet TAKE ONE TABLET BY MOUTH ONCE DAILY   apixaban (ELIQUIS) 5 MG TABS tablet Take 1 tablet (5 mg total) by mouth 2 (two) times daily.   apixaban (ELIQUIS) 5 MG TABS tablet Take 1 tablet (5 mg total) by mouth 2 (two) times daily.   Ascorbic Acid (VITAMIN C) 1000 MG tablet Take 1,000 mg by mouth daily.   esomeprazole (NEXIUM) 40 MG capsule TAKE ONE CAPSULE BY MOUTH ONCE DAILY   famotidine (PEPCID) 20 MG tablet TAKE ONE TABLET BY MOUTH EVERYDAY AT BEDTIME   ferrous sulfate 325 (65 FE) MG EC tablet Take 325 mg by mouth daily with breakfast.   furosemide (LASIX) 20 MG tablet Take three tablets ('60mg'$ ) once per day. Take and additional 3 tablets ('60mg'$ ) as needed for weight gain of 2 lbs overnight or 5 lbs in one week.   gabapentin (NEURONTIN) 600 MG tablet TAKE ONE TABLET BY MOUTH TWICE DAILY   HYDROcodone-acetaminophen (NORCO/VICODIN) 5-325 MG tablet Take 1 tablet by mouth every 6 (six) hours as needed for severe pain.   LORazepam (ATIVAN) 0.5 MG tablet TAKE ONE TABLET BY MOUTH EVERYDAY AT BEDTIME   losartan (COZAAR) 25 MG tablet Take 0.5 tablets (12.5 mg total) by mouth daily.   metoprolol tartrate (LOPRESSOR) 50 MG tablet TAKE 1 AND 1/2 TABLETS BY MOUTH TWICE DAILY   nitroGLYCERIN (NITROSTAT) 0.4 MG  SL tablet Place 1 tablet (0.4 mg total) under the tongue every 5 (five) minutes as needed for chest pain (x 3 pills). Reported on 09/01/2015   potassium chloride (KLOR-CON) 10 MEQ tablet Take 1 tab for 3 days   sertraline (ZOLOFT) 50 MG tablet TAKE ONE TABLET BY MOUTH ONCE DAILY   silver sulfADIAZINE (SILVADENE) 1 % cream Apply 1 application topically 2 (two) times daily.   Spacer/Aero-Holding Chambers (E-Z SPACER) inhaler Use as instructed   Tiotropium Bromide Monohydrate (SPIRIVA RESPIMAT) 2.5 MCG/ACT AERS Inhale 2 puffs into the lungs daily at 2 PM.   traZODone (DESYREL) 50 MG tablet TAKE ONE TABLET BY MOUTH EVERYDAY AT BEDTIME   triamcinolone cream (KENALOG) 0.1 % Apply 1 application topically 2 (two) times daily.    No facility-administered encounter medications on file as of 03/06/2021.    Care Gaps: AWV - Scheduled 05-30-2021 CCM F/U Call - Scheduled 06-29-2021 12 p Zoster Vaccine - Overdue COVID Booster #3 [employer]) - Overdue Flu Vaccine - Overdue  Star Rating Drugs: Losartan (Cozaar) 25 mg - Last filled 01-11-2021 90 DS at [facility] Pharmacist Assistant [phone]

## 2021-03-12 ENCOUNTER — Encounter: Payer: Self-pay | Admitting: Adult Health

## 2021-03-12 ENCOUNTER — Encounter: Payer: Self-pay | Admitting: Cardiovascular Disease

## 2021-03-12 ENCOUNTER — Other Ambulatory Visit: Payer: Self-pay

## 2021-03-12 ENCOUNTER — Ambulatory Visit: Payer: HMO | Admitting: Cardiovascular Disease

## 2021-03-12 VITALS — BP 138/76 | HR 87 | Ht 63.0 in | Wt 187.2 lb

## 2021-03-12 DIAGNOSIS — Z7901 Long term (current) use of anticoagulants: Secondary | ICD-10-CM

## 2021-03-12 DIAGNOSIS — I4821 Permanent atrial fibrillation: Secondary | ICD-10-CM

## 2021-03-12 DIAGNOSIS — I5032 Chronic diastolic (congestive) heart failure: Secondary | ICD-10-CM

## 2021-03-12 DIAGNOSIS — I482 Chronic atrial fibrillation, unspecified: Secondary | ICD-10-CM | POA: Diagnosis not present

## 2021-03-12 LAB — BASIC METABOLIC PANEL
BUN/Creatinine Ratio: 35 — ABNORMAL HIGH (ref 12–28)
BUN: 52 mg/dL — ABNORMAL HIGH (ref 8–27)
CO2: 21 mmol/L (ref 20–29)
Calcium: 9.2 mg/dL (ref 8.7–10.3)
Chloride: 105 mmol/L (ref 96–106)
Creatinine, Ser: 1.47 mg/dL — ABNORMAL HIGH (ref 0.57–1.00)
Glucose: 101 mg/dL — ABNORMAL HIGH (ref 65–99)
Potassium: 4.2 mmol/L (ref 3.5–5.2)
Sodium: 141 mmol/L (ref 134–144)
eGFR: 34 mL/min/{1.73_m2} — ABNORMAL LOW (ref >59)

## 2021-03-12 NOTE — Progress Notes (Signed)
Cardiology Office Note:    Date:  03/12/2021   ID:  [name], DOB September 14, 1929, MRN [id number]  PCP:  [name], NP  Cardiologist:  Mertie Moores, MD  Electrophysiologist:  None  Referring MD: [name], NP   Problem list 1.  Coronary artery disease-status post stenting of her right coronary artery in 2009 2.  Chronic diastolic congestive heart failure 3.  Mitral regurgitation 4.  Hypertension 5.  Hyperlipidemia 6.  Persistent atrial fibrillation 7.  COPD 8.  Pulmonary hypertension 9.  Hx of DVT 10.  History of colon cancer  Chief Complaint  Patient presents with   Atrial Fibrillation         [name] is a 87 y.o. female with a hx of coronary artery disease.  She is status post stenting of her right coronary artery x2 in 2009.  She has a history of chronic diastolic congestive heart failure.  She was admitted to the hospital in April, 2019 for acute on chronic congestive heart failure.  She was found to be anemic and had heme positive stools.  Has had pneumonia  Has had a cough for the past several weeks  Is more short of breath  Had a CT scan this am   Nov. 25, 2020:  No cardiac complaints Has difficulty swallowing  Food gets stuck -  Advised her to try warm water to help wash down  Breathing is ok .   Gets some shortness of breath with walking  Tries to avoid salty foods.   [name] 29, 2021: [name] is seen today for follow upu of her  atrial fib, CAD, CHF, Anemia   Nov. 23, 2021: [name] is seen for fllow up of her atrial fib, CAD, CHF Has not been feeling well.   Not hurting . Feels weak Gets out of breath with minimal walking  Has difficulty swallowing ,  Coughs frequently while eating    Aug. 29, 2022: [name] is seen for follow up of her atrial fib, CAD, CHF  Feels weak, fatigue  Still coughs when she eats .   We have discussed the possibiity of chronic aspiration in the past .  Leg swelling is better on the lasix 60 mg a day  Her  creatinine is fluctuating around 1.5.  Her last creatinine was 1.54. Will check today     Past Medical History:  Diagnosis Date   Adenocarcinoma, colon [facility]) Oncologist-- Dr Truitt Merle   Multifocal (2) Colon cancer @ ileocecal valve and ascending ( [id number]), Stage IIIB, grade I, MMR normal , 2 of 16 +lymph nodes, negative surgical margins---  06-02-2014  Right hemicolectomy w/ colostomy   Allergy    Anxiety    Asthma    inhaler "sometimes"   CAD (coronary artery disease)    a. LHC 4/09: pLAD 40, mDx 70-75, pLCx 43, mLCx 50, mRCA 90, EF 60% >> PCI: BMS x2 to RCA;  b. Myoview 8/12: normal   Cataract    bil cateracts removed   Chronic anemia    Chronic diastolic CHF (congestive heart failure) ([place])    a. Echo 912 - Mild LVH, EF 55-60%, no RWMA, Gr 1 DD, mild MR, mild LAE, mild RAE, PASP 59 mmHg (mod to severe pulmo HTN)   Clotting disorder (HCC)    hx of dvt, tia on eliquis   Colostomy in place [facility])    s/p colostomy takedown 5/16   Colovaginal fistula    s/p colostomy >> colostomy takedown 5/16  COPD with chronic bronchitis (HCC)    Depression    Dysrhythmia    A fib   Essential hypertension    GERD (gastroesophageal reflux disease)    History of adenomatous polyp of colon    History of blood transfusion    History of DVT of lower extremity    History of hiatal hernia    History of TIA (transient ischemic attack)    a. Head CT 6/15: small chronic lacunar infarct in thalamus   Hyperlipidemia    OA (osteoarthritis)    Osteoporosis    Peripheral neuropathy    Pneumonia    Pulmonary HTN ([place])    a. PASP on Echo in 9/12:  59 mmHg   Renal insufficiency    Sigmoid diverticulosis    s/p sigmoid colectomy   Stroke [facility])    TIAs   Wears dentures    Wears glasses     Past Surgical History:  Procedure Laterality Date   ABDOMINAL HYSTERECTOMY     ANTERIOR CERVICAL DECOMP/DISCECTOMY FUSION  01-31-2009   C5 -- 7   APPENDECTOMY     Bone spurs Bilateral    Feet    CARDIOVASCULAR STRESS TEST  02-26-2011   Normal lexiscan no exercise study/  no ischemia/  normal LV function and wall motion, ef 67%   CARPAL TUNNEL RELEASE Right    CATARACT EXTRACTION W/ INTRAOCULAR LENS  IMPLANT, BILATERAL Bilateral    CHOLECYSTECTOMY     COLOSTOMY N/A 06/02/2014   Procedure: DIVERTING DESCENDING END COLOSTOMY;  Surgeon: Donnie Mesa, MD;  Location: [place];  Service: General;  Laterality: N/A;   CORONARY ANGIOPLASTY WITH STENT PLACEMENT  11-04-2007  dr bensimhon   BMS x2 to RCA/  mild Non-obstructive disease LAD/  normal LVF   DILATION AND CURETTAGE OF UTERUS     ESOPHAGOGASTRODUODENOSCOPY N/A 10/28/2017   Procedure: ESOPHAGOGASTRODUODENOSCOPY (EGD);  Surgeon: Yetta Flock, MD;  Location: [facility] ENDOSCOPY;  Service: Gastroenterology;  Laterality: N/A;   EVALUATION UNDER ANESTHESIA WITH ANAL FISTULECTOMY N/A 10/13/2014   Procedure: ANAL EXAM UNDER ANESTHESIA ;  Surgeon: [name], MD;  Location: [facility];  Service: General;  Laterality: N/A;   HAND SURGERY     Tendon repair   LAPAROSCOPIC SIGMOID COLECTOMY N/A 11/24/2014   Procedure: SIGMOID COLECTOMY AND COLSOTOMY CLOSURE;  Surgeon: Donnie Mesa, MD;  Location: [place];  Service: General;  Laterality: N/A;   LUMBAR LAMINECTOMY  10-29-2002,  1969   Left  L3 -- 4  decompression   PARTIAL COLECTOMY N/A 06/02/2014   Procedure: RIGHT PARTIAL COLECTOMY ;  Surgeon: Donnie Mesa, MD;  Location: [place];  Service: General;  Laterality: N/A;   PATELLECTOMY Right 09/14/2013   Procedure: Wendi Maya;  Surgeon: Meredith Pel, MD;  Location: [place];  Service: Orthopedics;  Laterality: Right;   PROCTOSCOPY N/A 10/13/2014   Procedure: RIDGE PROCTOSCOPY;  Surgeon: [name], MD;  Location: [facility];  Service: General;  Laterality: N/A;   REVISION TOTAL KNEE ARTHROPLASTY Bilateral right  04-02-2011/  left 1996 & 10-05-1999   ROTATOR CUFF REPAIR Bilateral    TONSILLECTOMY     TOTAL KNEE ARTHROPLASTY Bilateral left 5597/  right  2000   TOTAL KNEE REVISION Left 08/02/2015   Procedure: LEFT FEMORAL REVISION;  Surgeon: Gaynelle Arabian, MD;  Location: [facility];  Service: Orthopedics;  Laterality: Left;   TRANSTHORACIC ECHOCARDIOGRAM  12-01-2009   Grade I diastolic dysfunction/  ef 60%/  moderate MR/  mild TR    Current Medications: Current Meds  Medication Sig   acetaminophen (TYLENOL) 500 MG tablet Take 1,000 mg by mouth every 6 (six) hours as needed for moderate pain.    albuterol (PROAIR HFA) 108 (90 Base) MCG/ACT inhaler Inhale 1-2 puffs into the lungs every 6 (six) hours as needed for wheezing or shortness of breath.   allopurinol (ZYLOPRIM) 100 MG tablet TAKE ONE TABLET BY MOUTH ONCE DAILY   ANORO ELLIPTA 62.5-25 MCG/INH AEPB Inhale 1 puff into the lungs daily.   apixaban (ELIQUIS) 5 MG TABS tablet Take 1 tablet (5 mg total) by mouth 2 (two) times daily.   Ascorbic Acid (VITAMIN C) 1000 MG tablet Take 1,000 mg by mouth daily.   esomeprazole (NEXIUM) 40 MG capsule TAKE ONE CAPSULE BY MOUTH ONCE DAILY   famotidine (PEPCID) 20 MG tablet TAKE ONE TABLET BY MOUTH EVERYDAY AT BEDTIME   ferrous sulfate 325 (65 FE) MG EC tablet Take 325 mg by mouth daily with breakfast.   furosemide (LASIX) 20 MG tablet Take three tablets ($RemoveBefore'60mg'WKpJcvjhhWNPW$ ) once per day. Take and additional 3 tablets ($RemoveBe'60mg'fTaEuSxmo$ ) as needed for weight gain of 2 lbs overnight or 5 lbs in one week.   gabapentin (NEURONTIN) 600 MG tablet TAKE ONE TABLET BY MOUTH TWICE DAILY   LORazepam (ATIVAN) 0.5 MG tablet TAKE ONE TABLET BY MOUTH EVERYDAY AT BEDTIME   losartan (COZAAR) 25 MG tablet Take 0.5 tablets (12.5 mg total) by mouth daily.   metoprolol tartrate (LOPRESSOR) 50 MG tablet TAKE 1 AND 1/2 TABLETS BY MOUTH TWICE DAILY   nitroGLYCERIN (NITROSTAT) 0.4 MG SL tablet Place 1 tablet (0.4 mg total) under the tongue every 5 (five) minutes as needed for chest pain (x 3 pills). Reported on 09/01/2015   potassium chloride (KLOR-CON) 10 MEQ tablet Take 1 tab for 3 days   sertraline  (ZOLOFT) 50 MG tablet TAKE ONE TABLET BY MOUTH ONCE DAILY   silver sulfADIAZINE (SILVADENE) 1 % cream Apply 1 application topically 2 (two) times daily.   Spacer/Aero-Holding Chambers (E-Z SPACER) inhaler Use as instructed   traZODone (DESYREL) 50 MG tablet TAKE ONE TABLET BY MOUTH EVERYDAY AT BEDTIME   triamcinolone cream (KENALOG) 0.1 % Apply 1 application topically 2 (two) times daily.      Allergies:   Patient has no known allergies.   Social History   Socioeconomic History   Marital status: Widowed    Spouse name: Not on file   Number of children: Not on file   Years of education: Not on file   Highest education level: Not on file  Occupational History   Not on file  Tobacco Use   Smoking status: Former    Packs/day: 0.50    Years: 16.00    Pack years: 8.00    Types: Cigarettes    Start date: 44    Quit date: 07/16/1967    Years since quitting: 53.6   Smokeless tobacco: Never  Vaping Use   Vaping Use: Never used  Substance and Sexual Activity   Alcohol use: No    Alcohol/week: 0.0 standard drinks   Drug use: No   Sexual activity: Not on file  Other Topics Concern   Not on file  Social History Narrative   Married, 2 children.  As of November 2015 her husband has been living in a nursing home for tendon after years as he suffers from multiple medical problems.   Patient does not drink alcohol nor does she smoke or chew tobacco products   Right handed   8th grade  1 cup daily   Social Determinants of Health   Financial Resource Strain: Low Risk    Difficulty of Paying Living Expenses: Not hard at all  Food Insecurity: No Food Insecurity   Worried About Charity fundraiser in the Last Year: Never true   Ran Out of Food in the Last Year: Never true  Transportation Needs: No Transportation Needs   Lack of Transportation (Medical): No   Lack of Transportation (Non-Medical): No  Physical Activity: Inactive   Days of Exercise per Week: 0 days   Minutes of  Exercise per Session: 0 min  Stress: No Stress Concern Present   Feeling of Stress : Not at all  Social Connections: Moderately Isolated   Frequency of Communication with Friends and Family: More than three times a week   Frequency of Social Gatherings with Friends and Family: Once a week   Attends Religious Services: 1 to 4 times per year   Active Member of [employer] or Organizations: No   Attends Archivist Meetings: Never   Marital Status: Widowed     Family History: The patient's family history includes Cancer (age of onset: 101) in her sister; Colon cancer (age of onset: 78) in her maternal uncle; Heart Problems in her father; Heart failure in her mother; Kidney cancer (age of onset: 27) in her brother; Osteoarthritis in her mother. There is no history of Esophageal cancer, Rectal cancer, Stomach cancer, or Pancreatic cancer.  ROS:   Please see the history of present illness.     All other systems reviewed and are negative.  EKGs/Labs/Other Studies Reviewed:    The following studies were reviewed today:    Recent Labs: 06/06/2020: TSH 2.620 11/10/2020: ALT 17; B Natriuretic Peptide 490.6 11/12/2020: Magnesium 2.0 01/11/2021: Hemoglobin 10.4; Platelets 144 02/01/2021: NT-Pro BNP 3,607 03/12/2021: BUN 52; Creatinine, Ser 1.47; Potassium 4.2; Sodium 141  Recent Lipid Panel    Component Value Date/Time   CHOL 130 01/19/2013 1014   TRIG 87.0 01/19/2013 1014   HDL 33.60 (L) 01/19/2013 1014   CHOLHDL 4 01/19/2013 1014   VLDL 17.4 01/19/2013 1014   LDLCALC 79 01/19/2013 1014    Physical Exam:      Physical Exam: Blood pressure 138/76, pulse 87, height $RemoveBe'5\' 3"'wVYtXefUG$  (1.6 m), weight 187 lb 3.2 oz (84.9 kg), SpO2 97 %.  GEN:  Well nourished, well developed in no acute distress HEENT: Normal NECK: No JVD; No carotid bruits LYMPHATICS: No lymphadenopathy CARDIAC: irreg. no murmurs, rubs, gallops RESPIRATORY:  Clear to auscultation without rales, wheezing or rhonchi  ABDOMEN:  Soft, non-tender, non-distended MUSCULOSKELETAL:  No edema; No deformity  SKIN: Warm and dry NEUROLOGIC:  Alert and oriented x 3    EKG:       ASSESSMENT:    1. Chronic diastolic heart failure ([place])   2. Permanent atrial fibrillation ([place])   3. Chronic anticoagulation   4. Chronic atrial fibrillation (HCC)     PLAN:      1.   Dyspnea:    chronic    2.  Coronary artery disease: no angina .    She denies having any angina.  3.  Chronic diastolic congestive heart failure:   Her symptoms and her leg swelling is better on Lasix 60 mg a day.  We will check a basic metabolic profile today.  4.  Chronic atrial fibrillation: Her creatinine is hovering around 1.5.  If it consistently stays above 1.5 we will need to reduce her Eliquis dose to  2.5 mg twice a day.  We will recheck her basic metabolic profile today.  Continue current medications for now.   Addendum:  Cr returned at 1.47.  cont Eliquis 5 mg po BID.  Continue to recheck frequently   Medication Adjustments/Labs and Tests Ordered: Current medicines are reviewed at length with the patient today.  Concerns regarding medicines are outlined above.  Orders Placed This Encounter  Procedures   Basic Metabolic Panel (BMET)    No orders of the defined types were placed in this encounter.   Patient Instructions  Medication Instructions:  Your physician recommends that you continue on your current medications as directed. Please refer to the Current Medication list given to you today. **We are checking your kidney function today. Depending on the result, we Melena need to reduce your dose of Eliquis (Apixaban)  *If you need a refill on your cardiac medications before your next appointment, please call your pharmacy*   Lab Work: TODAY - BMET If you have labs (blood work) drawn today and your tests are completely normal, you will receive your results only by: [place] (if you have MyChart) OR A paper copy in the mail If  you have any lab test that is abnormal or we need to change your treatment, we will call you to review the results.   Testing/Procedures: None Ordered   Follow-Up: At [facility], you and your health needs are our priority.  As part of our continuing mission to provide you with exceptional heart care, we have created designated Provider Care Teams.  These Care Teams include your primary Cardiologist (physician) and Advanced Practice Providers (APPs -  Physician Assistants and Nurse Practitioners) who all work together to provide you with the care you need, when you need it.   Your next appointment:   1 year(s)  The format for your next appointment:   In Person  Provider:   Laurann Montana, NP      Signed, Mertie Moores, MD  03/12/2021 9:01 PM    [place]

## 2021-03-12 NOTE — Telephone Encounter (Signed)
Spoke to pt and she stated that she saw her cardiologist and was advised that the dog scratch on her arm need to be looked at. Pt has been scheduled for tomorrow and advised to seek help if issue worsens. Pt verbalized understanding and will call her daughter to see if she can bring her to the appt.

## 2021-03-12 NOTE — Patient Instructions (Signed)
Medication Instructions:  Your physician recommends that you continue on your current medications as directed. Please refer to the Current Medication list given to you today. **We are checking your kidney function today. Depending on the result, we Pedretti need to reduce your dose of Eliquis (Apixaban)  *If you need a refill on your cardiac medications before your next appointment, please call your pharmacy*   Lab Work: TODAY - BMET If you have labs (blood work) drawn today and your tests are completely normal, you will receive your results only by: [place] (if you have MyChart) OR A paper copy in the mail If you have any lab test that is abnormal or we need to change your treatment, we will call you to review the results.   Testing/Procedures: None Ordered   Follow-Up: At [facility], you and your health needs are our priority.  As part of our continuing mission to provide you with exceptional heart care, we have created designated Provider Care Teams.  These Care Teams include your primary Cardiologist (physician) and Advanced Practice Providers (APPs -  Physician Assistants and Nurse Practitioners) who all work together to provide you with the care you need, when you need it.   Your next appointment:   1 year(s)  The format for your next appointment:   In Person  Provider:   Laurann Montana, NP

## 2021-03-13 ENCOUNTER — Encounter: Payer: Self-pay | Admitting: Adult Health

## 2021-03-13 ENCOUNTER — Ambulatory Visit (INDEPENDENT_AMBULATORY_CARE_PROVIDER_SITE_OTHER): Payer: HMO | Admitting: Adult Health

## 2021-03-13 VITALS — BP 134/76 | HR 86 | Temp 98.2°F | Ht 63.0 in | Wt 187.0 lb

## 2021-03-13 DIAGNOSIS — S40811A Abrasion of right upper arm, initial encounter: Secondary | ICD-10-CM

## 2021-03-13 DIAGNOSIS — F5101 Primary insomnia: Secondary | ICD-10-CM

## 2021-03-13 DIAGNOSIS — T148XXA Other injury of unspecified body region, initial encounter: Secondary | ICD-10-CM

## 2021-03-13 DIAGNOSIS — H6982 Other specified disorders of Eustachian tube, left ear: Secondary | ICD-10-CM

## 2021-03-13 MED ORDER — DOXYCYCLINE HYCLATE 100 MG PO CAPS: 100.0000 mg | ORAL_CAPSULE | Freq: Two times a day (BID) | ORAL | 0 refills | Status: DC

## 2021-03-13 NOTE — Progress Notes (Signed)
Subjective:    Patient ID: [name], female    DOB: May 25, 1930, 87 y.o.   MRN: [id number]  HPI  87 year old female who  has a past medical history of Adenocarcinoma, colon ([place]) (Oncologist-- Dr Truitt Merle), Allergy, Anxiety, Asthma, CAD (coronary artery disease), Cataract, Chronic anemia, Chronic diastolic CHF (congestive heart failure) ([place]), Clotting disorder ([place]), Colostomy in place [facility]), Colovaginal fistula, COPD with chronic bronchitis ([place]), Depression, Dysrhythmia, Essential hypertension, GERD (gastroesophageal reflux disease), History of adenomatous polyp of colon, History of blood transfusion, History of DVT of lower extremity, History of hiatal hernia, History of TIA (transient ischemic attack), Hyperlipidemia, OA (osteoarthritis), Osteoporosis, Peripheral neuropathy, Pneumonia, Pulmonary HTN ([place]), Renal insufficiency, Sigmoid diverticulosis, Stroke ([place]), Wears dentures, and Wears glasses.  She presents to the office today with her daughter for multiple issues  About 3 to 4 days ago she was scratched by her family dog on her right upper arm.  She was seen by cardiology yesterday and she is advised to follow-up with her PCP for concern of infection.  She has been placing a clean bandage over the wound.  Does have some bloody discharge.  Area is red around the wound. UTD on Tetanus. Dog is up to date on all shots.  Insomnia-chronic issue.  Currently takes trazodone 50 mg nightly and Ativan 0.5 mg nightly.  Reports that she continues to not sleep well at night heavy sleeping 3 to 5 hours.  Having trouble staying asleep.  Does try to incorporate good sleep hygiene into her nightly routine Left ear pain-present for roughly 2 weeks.  Ear feels full and painful.  Has mild headache as well.  Denies drainage, fevers, chills, sinus pain or pressure. Uses Flonase daily.    Review of Systems See HPI   Past Medical History:  Diagnosis Date   Adenocarcinoma, colon [facility]) Oncologist-- Dr Truitt Merle   Multifocal (2) Colon cancer @ ileocecal valve and ascending ( [id number]), Stage IIIB, grade I, MMR normal , 2 of 16 +lymph nodes, negative surgical margins---  06-02-2014  Right hemicolectomy w/ colostomy   Allergy    Anxiety    Asthma    inhaler "sometimes"   CAD (coronary artery disease)    a. LHC 4/09: pLAD 40, mDx 70-75, pLCx 48, mLCx 50, mRCA 90, EF 60% >> PCI: BMS x2 to RCA;  b. Myoview 8/12: normal   Cataract    bil cateracts removed   Chronic anemia    Chronic diastolic CHF (congestive heart failure) ([place])    a. Echo 912 - Mild LVH, EF 55-60%, no RWMA, Gr 1 DD, mild MR, mild LAE, mild RAE, PASP 59 mmHg (mod to severe pulmo HTN)   Clotting disorder (HCC)    hx of dvt, tia on eliquis   Colostomy in place [facility])    s/p colostomy takedown 5/16   Colovaginal fistula    s/p colostomy >> colostomy takedown 5/16   COPD with chronic bronchitis (HCC)    Depression    Dysrhythmia    A fib   Essential hypertension    GERD (gastroesophageal reflux disease)    History of adenomatous polyp of colon    History of blood transfusion    History of DVT of lower extremity    History of hiatal hernia    History of TIA (transient ischemic attack)    a. Head CT 6/15: small chronic lacunar infarct in thalamus   Hyperlipidemia    OA (osteoarthritis)    Osteoporosis  Peripheral neuropathy    Pneumonia    Pulmonary HTN ([place])    a. PASP on Echo in 9/12:  59 mmHg   Renal insufficiency    Sigmoid diverticulosis    s/p sigmoid colectomy   Stroke [facility])    TIAs   Wears dentures    Wears glasses     Social History   Socioeconomic History   Marital status: Widowed    Spouse name: Not on file   Number of children: Not on file   Years of education: Not on file   Highest education level: Not on file  Occupational History   Not on file  Tobacco Use   Smoking status: Former    Packs/day: 0.50    Years: 16.00    Pack years: 8.00    Types: Cigarettes    Start date: 68    Quit  date: 07/16/1967    Years since quitting: 53.6   Smokeless tobacco: Never  Vaping Use   Vaping Use: Never used  Substance and Sexual Activity   Alcohol use: No    Alcohol/week: 0.0 standard drinks   Drug use: No   Sexual activity: Not on file  Other Topics Concern   Not on file  Social History Narrative   Married, 2 children.  As of November 2015 her husband has been living in a nursing home for tendon after years as he suffers from multiple medical problems.   Patient does not drink alcohol nor does she smoke or chew tobacco products   Right handed   8th grade   1 cup daily   Social Determinants of Health   Financial Resource Strain: Low Risk    Difficulty of Paying Living Expenses: Not hard at all  Food Insecurity: No Food Insecurity   Worried About Charity fundraiser in the Last Year: Never true   Ran Out of Food in the Last Year: Never true  Transportation Needs: No Transportation Needs   Lack of Transportation (Medical): No   Lack of Transportation (Non-Medical): No  Physical Activity: Inactive   Days of Exercise per Week: 0 days   Minutes of Exercise per Session: 0 min  Stress: No Stress Concern Present   Feeling of Stress : Not at all  Social Connections: Moderately Isolated   Frequency of Communication with Friends and Family: More than three times a week   Frequency of Social Gatherings with Friends and Family: Once a week   Attends Religious Services: 1 to 4 times per year   Active Member of [employer] or Organizations: No   Attends Archivist Meetings: Never   Marital Status: Widowed  Human resources officer Violence: Not At Risk   Fear of Current or Ex-Partner: No   Emotionally Abused: No   Physically Abused: No   Sexually Abused: No    Past Surgical History:  Procedure Laterality Date   ABDOMINAL HYSTERECTOMY     ANTERIOR CERVICAL DECOMP/DISCECTOMY FUSION  01-31-2009   C5 -- 7   APPENDECTOMY     Bone spurs Bilateral    Feet   CARDIOVASCULAR STRESS  TEST  02-26-2011   Normal lexiscan no exercise study/  no ischemia/  normal LV function and wall motion, ef 67%   CARPAL TUNNEL RELEASE Right    CATARACT EXTRACTION W/ INTRAOCULAR LENS  IMPLANT, BILATERAL Bilateral    CHOLECYSTECTOMY     COLOSTOMY N/A 06/02/2014   Procedure: DIVERTING DESCENDING END COLOSTOMY;  Surgeon: Donnie Mesa, MD;  Location: [place];  Service: General;  Laterality: N/A;   CORONARY ANGIOPLASTY WITH STENT PLACEMENT  11-04-2007  dr bensimhon   BMS x2 to RCA/  mild Non-obstructive disease LAD/  normal LVF   DILATION AND CURETTAGE OF UTERUS     ESOPHAGOGASTRODUODENOSCOPY N/A 10/28/2017   Procedure: ESOPHAGOGASTRODUODENOSCOPY (EGD);  Surgeon: Yetta Flock, MD;  Location[facility] END[facility]COPY;  Service: Gastroenterology;  Laterality: N/A;   EVALUATION UNDER ANESTHESIA WITH ANAL FISTULECTOMY N/A 10/13/2014   Procedure: ANAL EXAM UNDER ANESTHESIA ;  Surgeon: [name], MD;  Location: [facility];  Service: General;  Laterality: N/A;   HAND SURGERY     Tendon repair   LAPAROSCOPIC SIGMOID COLECTOMY N/A 11/24/2014   Procedure: SIGMOID COLECTOMY AND COLSOTOMY CLOSURE;  Surgeon: Donnie Mesa, MD;  Location: [place];  Service: General;  Laterality: N/A;   LUMBAR LAMINECTOMY  10-29-2002,  1969   Left  L3 -- 4  decompression   PARTIAL COLECTOMY N/A 06/02/2014   Procedure: RIGHT PARTIAL COLECTOMY ;  Surgeon: Donnie Mesa, MD;  Location: [place];  Service: General;  Laterality: N/A;   PATELLECTOMY Right 09/14/2013   Procedure: Wendi Maya;  Surgeon: Meredith Pel, MD;  Location: [place];  Service: Orthopedics;  Laterality: Right;   PROCTOSCOPY N/A 10/13/2014   Procedure: RIDGE PROCTOSCOPY;  Surgeon: [name], MD;  Location: [facility];  Service: General;  Laterality: N/A;   REVISION TOTAL KNEE ARTHROPLASTY Bilateral right  04-02-2011/  left 1996 & 10-05-1999   ROTATOR CUFF REPAIR Bilateral    TONSILLECTOMY     TOTAL KNEE ARTHROPLASTY Bilateral left 1610/  right 2000   TOTAL KNEE  REVISION Left 08/02/2015   Procedure: LEFT FEMORAL REVISION;  Surgeon: Gaynelle Arabian, MD;  Location: [facility];  Service: Orthopedics;  Laterality: Left;   TRANSTHORACIC ECHOCARDIOGRAM  12-01-2009   Grade I diastolic dysfunction/  ef 60%/  moderate MR/  mild TR    Family History  Problem Relation Age of Onset   Osteoarthritis Mother    Heart failure Mother    Colon cancer Maternal Uncle 34   Heart Problems Father    Cancer Sister 69       ? colon cancer    Kidney cancer Brother 2   Esophageal cancer Neg Hx    Rectal cancer Neg Hx    Stomach cancer Neg Hx    Pancreatic cancer Neg Hx     No Known Allergies  Current Outpatient Medications on File Prior to Visit  Medication Sig Dispense Refill   acetaminophen (TYLENOL) 500 MG tablet Take 1,000 mg by mouth every 6 (six) hours as needed for moderate pain.      albuterol (PROAIR HFA) 108 (90 Base) MCG/ACT inhaler Inhale 1-2 puffs into the lungs every 6 (six) hours as needed for wheezing or shortness of breath. 6.7 g 6   allopurinol (ZYLOPRIM) 100 MG tablet TAKE ONE TABLET BY MOUTH ONCE DAILY 90 tablet 3   ANORO ELLIPTA 62.5-25 MCG/INH AEPB Inhale 1 puff into the lungs daily.     apixaban (ELIQUIS) 5 MG TABS tablet Take 1 tablet (5 mg total) by mouth 2 (two) times daily. 7 tablet 0   Ascorbic Acid (VITAMIN C) 1000 MG tablet Take 1,000 mg by mouth daily.     esomeprazole (NEXIUM) 40 MG capsule TAKE ONE CAPSULE BY MOUTH ONCE DAILY 90 capsule 1   famotidine (PEPCID) 20 MG tablet TAKE ONE TABLET BY MOUTH EVERYDAY AT BEDTIME 30 tablet 3   ferrous sulfate 325 (65 FE) MG EC tablet Take  325 mg by mouth daily with breakfast.     furosemide (LASIX) 20 MG tablet Take three tablets (5m) once per day. Take and additional 3 tablets (666m as needed for weight gain of 2 lbs overnight or 5 lbs in one week. 135 tablet 2   gabapentin (NEURONTIN) 600 MG tablet TAKE ONE TABLET BY MOUTH TWICE DAILY 180 tablet 1   HYDROcodone-acetaminophen (NORCO/VICODIN) 5-325  MG tablet Take 1 tablet by mouth every 6 (six) hours as needed for severe pain. 30 tablet 0   LORazepam (ATIVAN) 0.5 MG tablet TAKE ONE TABLET BY MOUTH EVERYDAY AT BEDTIME 30 tablet 2   losartan (COZAAR) 25 MG tablet Take 0.5 tablets (12.5 mg total) by mouth daily. 45 tablet 1   metoprolol tartrate (LOPRESSOR) 50 MG tablet TAKE 1 AND 1/2 TABLETS BY MOUTH TWICE DAILY 270 tablet 1   nitroGLYCERIN (NITROSTAT) 0.4 MG SL tablet Place 1 tablet (0.4 mg total) under the tongue every 5 (five) minutes as needed for chest pain (x 3 pills). Reported on 09/01/2015 25 tablet 3   potassium chloride (KLOR-CON) 10 MEQ tablet Take 1 tab for 3 days 30 tablet 1   sertraline (ZOLOFT) 50 MG tablet TAKE ONE TABLET BY MOUTH ONCE DAILY 90 tablet 1   silver sulfADIAZINE (SILVADENE) 1 % cream Apply 1 application topically 2 (two) times daily.     Spacer/Aero-Holding Chambers (E-Z SPACER) inhaler Use as instructed 1 each 2   traZODone (DESYREL) 50 MG tablet TAKE ONE TABLET BY MOUTH EVERYDAY AT BEDTIME (Patient taking differently: Take 100 mg by mouth at bedtime.) 90 tablet 1   triamcinolone cream (KENALOG) 0.1 % Apply 1 application topically 2 (two) times daily.      Tiotropium Bromide Monohydrate (SPIRIVA RESPIMAT) 2.5 MCG/ACT AERS Inhale 2 puffs into the lungs daily at 2 PM. 3 each 3   No current facility-administered medications on file prior to visit.    BP 134/76   Pulse 86   Temp 98.2 F (36.8 C) (Oral)   Ht _0  (1.6 m)   Wt 187 lb (84.8 kg)   SpO2 93%   BMI 33.13 kg/m       Objective:   Physical Exam Vitals and nursing note reviewed.  Constitutional:      Appearance: Normal appearance.  HENT:     Right Ear: Hearing, tympanic membrane, ear canal and external ear normal.     Left Ear: Hearing normal. A middle ear effusion is present. Tympanic membrane is bulging. Tympanic membrane is not erythematous.  Cardiovascular:     Rate and Rhythm: Normal rate and regular rhythm.     Pulses: Normal pulses.      Heart sounds: Normal heart sounds.  Pulmonary:     Effort: Pulmonary effort is normal.     Breath sounds: Normal breath sounds.  Musculoskeletal:        General: Normal range of motion.  Skin:    General: Skin is warm and dry.     Capillary Refill: Capillary refill takes less than 2 seconds.          Comments: Approximately 2 inch long scratch to right upper arm.  There is purulent drainage with localized erythema and warmth noted.  Neurological:     General: No focal deficit present.     Mental Status: She is alert and oriented to person, place, and time.       Assessment & Plan:  1. Animal scratch  - doxycycline (VIBRAMYCIN) 100 MG capsule; Take 1  capsule (100 mg total) by mouth 2 (two) times daily.  Dispense: 14 capsule; Refill: 0 - Follow up if 2-3 days if not healing  - Continue to keep wound clean and dry  2. Primary insomnia -Have her increase trazodone to 100 mg nightly.  She will follow-up with me in the next 2 to 3 weeks if no improvement  3. Eustachian tube dysfunction, left -Can add over-the-counter allergy medicine such as Zyrtec or Claritin.  It would be okay for her to use Afrin for a day or 2 as well.  Follow-up as needed  [name], NP

## 2021-03-13 NOTE — Patient Instructions (Signed)
I am going to treat you with Doxycycline for the dog scratch   You can use Claritin or Zyrtec for the ear pain. You can also use 2-3 days of Afrin nasal spray   Increase the Trazodone from 50 mg to 100 mg every night for sleep.

## 2021-03-21 ENCOUNTER — Telehealth: Payer: Self-pay | Admitting: Pharmacist

## 2021-03-21 NOTE — Chronic Care Management (AMB) (Signed)
Chronic Care Management Pharmacy Assistant   Name: [name]  MRN: [id number] DOB: Sep 17, 1929   Reason for Encounter: No fill report for Furosemide   Recent office visits:  03-13-2021 Whitney Peng, NP - Patient presented for Animal Scratch. Prescribed Doxycycline 100 mg.  Recent consult visits:  03-12-2021 Whitney, Wonda Cheng, MD (Cardiology) - Patient presented for CHF and other concerns. No medication changes.  Hospital visits:  Medication Reconciliation was completed by comparing discharge summary, patient's EMR and Pharmacy list, and upon discussion with patient.   Presented to [place] ED on 12-18-2020 due to skin tear lower left leg. Patient was there for 1 hour.   New?Medications Started at [facility] Discharge:?? -started  None   Medication Changes at Hospital Discharge: -Changed  None Medications Discontinued at Hospital Discharge: -Stopped None Medications that remain the same after Hospital Discharge:??  -All other medications will remain the same.      Medications: Outpatient Encounter Medications as of 03/21/2021  Medication Sig   acetaminophen (TYLENOL) 500 MG tablet Take 1,000 mg by mouth every 6 (six) hours as needed for moderate pain.    albuterol (PROAIR HFA) 108 (90 Base) MCG/ACT inhaler Inhale 1-2 puffs into the lungs every 6 (six) hours as needed for wheezing or shortness of breath.   allopurinol (ZYLOPRIM) 100 MG tablet TAKE ONE TABLET BY MOUTH ONCE DAILY   ANORO ELLIPTA 62.5-25 MCG/INH AEPB Inhale 1 puff into the lungs daily.   apixaban (ELIQUIS) 5 MG TABS tablet Take 1 tablet (5 mg total) by mouth 2 (two) times daily.   Ascorbic Acid (VITAMIN C) 1000 MG tablet Take 1,000 mg by mouth daily.   doxycycline (VIBRAMYCIN) 100 MG capsule Take 1 capsule (100 mg total) by mouth 2 (two) times daily.   esomeprazole (NEXIUM) 40 MG capsule TAKE ONE CAPSULE BY MOUTH ONCE DAILY   famotidine (PEPCID) 20 MG tablet TAKE ONE TABLET BY MOUTH EVERYDAY AT  BEDTIME   ferrous sulfate 325 (65 FE) MG EC tablet Take 325 mg by mouth daily with breakfast.   furosemide (LASIX) 20 MG tablet Take three tablets ('60mg'$ ) once per day. Take and additional 3 tablets ('60mg'$ ) as needed for weight gain of 2 lbs overnight or 5 lbs in one week.   gabapentin (NEURONTIN) 600 MG tablet TAKE ONE TABLET BY MOUTH TWICE DAILY   HYDROcodone-acetaminophen (NORCO/VICODIN) 5-325 MG tablet Take 1 tablet by mouth every 6 (six) hours as needed for severe pain.   LORazepam (ATIVAN) 0.5 MG tablet TAKE ONE TABLET BY MOUTH EVERYDAY AT BEDTIME   losartan (COZAAR) 25 MG tablet Take 0.5 tablets (12.5 mg total) by mouth daily.   metoprolol tartrate (LOPRESSOR) 50 MG tablet TAKE 1 AND 1/2 TABLETS BY MOUTH TWICE DAILY   nitroGLYCERIN (NITROSTAT) 0.4 MG SL tablet Place 1 tablet (0.4 mg total) under the tongue every 5 (five) minutes as needed for chest pain (x 3 pills). Reported on 09/01/2015   potassium chloride (KLOR-CON) 10 MEQ tablet Take 1 tab for 3 days   sertraline (ZOLOFT) 50 MG tablet TAKE ONE TABLET BY MOUTH ONCE DAILY   silver sulfADIAZINE (SILVADENE) 1 % cream Apply 1 application topically 2 (two) times daily.   Spacer/Aero-Holding Chambers (E-Z SPACER) inhaler Use as instructed   Tiotropium Bromide Monohydrate (SPIRIVA RESPIMAT) 2.5 MCG/ACT AERS Inhale 2 puffs into the lungs daily at 2 PM.   traZODone (DESYREL) 50 MG tablet TAKE ONE TABLET BY MOUTH EVERYDAY AT BEDTIME (Patient taking differently: Take 100 mg by mouth  at bedtime.)   triamcinolone cream (KENALOG) 0.1 % Apply 1 application topically 2 (two) times daily.    No facility-administered encounter medications on file as of 03/21/2021.  Notes: Call to patient for No fill report on her Furosemide.. Last filled on 02-28-21 for 18 DS, needing to find out if she is in need of more of the medication or is still using, no answer left voicemail for her to return my call  03-26-2021 Call to patient she states she is almost out of her  Furosemide and would like to have it filled. She also notes that her Trazodone was increased to 100 mg at night and she has been taking 2 instead of 1 and will also be out of it soon. Note sent to Clinical Pharmacist to have script sent to [facility] for Trazodone change, will coordinate an acute delivery for both medications Patient advised she will be home all tomorrow for delivery. Also updated no fill report for Furosemide.  Care Gaps: AWV - Scheduled 05-30-2021 CCM F/U Call - Scheduled 06-29-2021 12 p Zoster Vaccine - Overdue COVID Booster #3 [employer]) - Overdue Flu Vaccine - Overdue  Star Rating Drugs: Losartan (Cozaar) 25 mg - Last filled 01-11-2021 90 DS at [facility] Pharmacist Assistant [phone]

## 2021-03-26 ENCOUNTER — Telehealth: Payer: Self-pay

## 2021-03-26 MED ORDER — TRAZODONE HCL 100 MG PO TABS: 100.0000 mg | ORAL_TABLET | Freq: Every day | ORAL | 0 refills | Status: DC

## 2021-03-26 NOTE — Telephone Encounter (Signed)
-----   Message from Viona Gilmore, [facility] sent at 03/26/2021  2:50 PM EDT ----- Regarding: Trazodone refill Hi,  Looks like Cory increased her Trazodone to 100 mg on 8/30 but never sent an updated rx to the pharmacy. The patient will be completely out as of tomorrow since she has been doubling up. Can you send in a new rx to [facility] for the 100 mg tablets?  Thank you! Maddie

## 2021-04-01 ENCOUNTER — Other Ambulatory Visit: Payer: Self-pay | Admitting: Adult Health

## 2021-04-01 DIAGNOSIS — Z760 Encounter for issue of repeat prescription: Secondary | ICD-10-CM

## 2021-04-05 ENCOUNTER — Emergency Department (HOSPITAL_COMMUNITY): Payer: HMO

## 2021-04-05 ENCOUNTER — Other Ambulatory Visit: Payer: Self-pay | Admitting: Primary Care

## 2021-04-05 ENCOUNTER — Other Ambulatory Visit: Payer: Self-pay

## 2021-04-05 ENCOUNTER — Encounter (HOSPITAL_COMMUNITY): Payer: Self-pay | Admitting: Emergency Medicine

## 2021-04-05 ENCOUNTER — Inpatient Hospital Stay (HOSPITAL_COMMUNITY)
Admission: EM | Admit: 2021-04-05 | Discharge: 2021-04-10 | DRG: 368 | Disposition: A | Discharge status: Hospice | Payer: HMO | Attending: Student in an Organized Health Care Education/Training Program | Admitting: Student in an Organized Health Care Education/Training Program

## 2021-04-05 DIAGNOSIS — E876 Hypokalemia: Secondary | ICD-10-CM | POA: Diagnosis present

## 2021-04-05 DIAGNOSIS — I130 Hypertensive heart and chronic kidney disease with heart failure and stage 1 through stage 4 chronic kidney disease, or unspecified chronic kidney disease: Secondary | ICD-10-CM | POA: Diagnosis present

## 2021-04-05 DIAGNOSIS — R1013 Epigastric pain: Secondary | ICD-10-CM | POA: Diagnosis not present

## 2021-04-05 DIAGNOSIS — I4819 Other persistent atrial fibrillation: Secondary | ICD-10-CM | POA: Diagnosis present

## 2021-04-05 DIAGNOSIS — R627 Adult failure to thrive: Secondary | ICD-10-CM | POA: Diagnosis not present

## 2021-04-05 DIAGNOSIS — J90 Pleural effusion, not elsewhere classified: Secondary | ICD-10-CM | POA: Diagnosis not present

## 2021-04-05 DIAGNOSIS — R52 Pain, unspecified: Secondary | ICD-10-CM | POA: Diagnosis not present

## 2021-04-05 DIAGNOSIS — K2970 Gastritis, unspecified, without bleeding: Secondary | ICD-10-CM | POA: Diagnosis present

## 2021-04-05 DIAGNOSIS — K2990 Gastroduodenitis, unspecified, without bleeding: Secondary | ICD-10-CM | POA: Diagnosis present

## 2021-04-05 DIAGNOSIS — R072 Precordial pain: Secondary | ICD-10-CM | POA: Diagnosis present

## 2021-04-05 DIAGNOSIS — R109 Unspecified abdominal pain: Secondary | ICD-10-CM | POA: Diagnosis not present

## 2021-04-05 DIAGNOSIS — K224 Dyskinesia of esophagus: Secondary | ICD-10-CM | POA: Diagnosis present

## 2021-04-05 DIAGNOSIS — Z86718 Personal history of other venous thrombosis and embolism: Secondary | ICD-10-CM

## 2021-04-05 DIAGNOSIS — Z961 Presence of intraocular lens: Secondary | ICD-10-CM | POA: Diagnosis present

## 2021-04-05 DIAGNOSIS — Z8673 Personal history of transient ischemic attack (TIA), and cerebral infarction without residual deficits: Secondary | ICD-10-CM

## 2021-04-05 DIAGNOSIS — T17908A Unspecified foreign body in respiratory tract, part unspecified causing other injury, initial encounter: Secondary | ICD-10-CM

## 2021-04-05 DIAGNOSIS — Z87891 Personal history of nicotine dependence: Secondary | ICD-10-CM

## 2021-04-05 DIAGNOSIS — R933 Abnormal findings on diagnostic imaging of other parts of digestive tract: Secondary | ICD-10-CM | POA: Diagnosis not present

## 2021-04-05 DIAGNOSIS — E1122 Type 2 diabetes mellitus with diabetic chronic kidney disease: Secondary | ICD-10-CM | POA: Diagnosis present

## 2021-04-05 DIAGNOSIS — F419 Anxiety disorder, unspecified: Secondary | ICD-10-CM | POA: Diagnosis present

## 2021-04-05 DIAGNOSIS — R0902 Hypoxemia: Secondary | ICD-10-CM | POA: Diagnosis not present

## 2021-04-05 DIAGNOSIS — Z7189 Other specified counseling: Secondary | ICD-10-CM | POA: Diagnosis not present

## 2021-04-05 DIAGNOSIS — R54 Age-related physical debility: Secondary | ICD-10-CM | POA: Diagnosis present

## 2021-04-05 DIAGNOSIS — I517 Cardiomegaly: Secondary | ICD-10-CM | POA: Diagnosis not present

## 2021-04-05 DIAGNOSIS — Z9841 Cataract extraction status, right eye: Secondary | ICD-10-CM

## 2021-04-05 DIAGNOSIS — M810 Age-related osteoporosis without current pathological fracture: Secondary | ICD-10-CM | POA: Diagnosis present

## 2021-04-05 DIAGNOSIS — Z66 Do not resuscitate: Secondary | ICD-10-CM | POA: Diagnosis not present

## 2021-04-05 DIAGNOSIS — F32A Depression, unspecified: Secondary | ICD-10-CM | POA: Diagnosis present

## 2021-04-05 DIAGNOSIS — Z86010 Personal history of colonic polyps: Secondary | ICD-10-CM

## 2021-04-05 DIAGNOSIS — I5033 Acute on chronic diastolic (congestive) heart failure: Secondary | ICD-10-CM

## 2021-04-05 DIAGNOSIS — Z8249 Family history of ischemic heart disease and other diseases of the circulatory system: Secondary | ICD-10-CM

## 2021-04-05 DIAGNOSIS — J9601 Acute respiratory failure with hypoxia: Secondary | ICD-10-CM | POA: Diagnosis present

## 2021-04-05 DIAGNOSIS — K449 Diaphragmatic hernia without obstruction or gangrene: Secondary | ICD-10-CM

## 2021-04-05 DIAGNOSIS — Z981 Arthrodesis status: Secondary | ICD-10-CM

## 2021-04-05 DIAGNOSIS — I2510 Atherosclerotic heart disease of native coronary artery without angina pectoris: Secondary | ICD-10-CM | POA: Diagnosis present

## 2021-04-05 DIAGNOSIS — Z955 Presence of coronary angioplasty implant and graft: Secondary | ICD-10-CM

## 2021-04-05 DIAGNOSIS — R0789 Other chest pain: Secondary | ICD-10-CM

## 2021-04-05 DIAGNOSIS — Q399 Congenital malformation of esophagus, unspecified: Principal | ICD-10-CM

## 2021-04-05 DIAGNOSIS — N179 Acute kidney failure, unspecified: Secondary | ICD-10-CM | POA: Diagnosis not present

## 2021-04-05 DIAGNOSIS — Z515 Encounter for palliative care: Secondary | ICD-10-CM | POA: Diagnosis not present

## 2021-04-05 DIAGNOSIS — Z20822 Contact with and (suspected) exposure to covid-19: Secondary | ICD-10-CM | POA: Diagnosis present

## 2021-04-05 DIAGNOSIS — J811 Chronic pulmonary edema: Secondary | ICD-10-CM | POA: Diagnosis not present

## 2021-04-05 DIAGNOSIS — Z8051 Family history of malignant neoplasm of kidney: Secondary | ICD-10-CM

## 2021-04-05 DIAGNOSIS — Z96653 Presence of artificial knee joint, bilateral: Secondary | ICD-10-CM | POA: Diagnosis present

## 2021-04-05 DIAGNOSIS — Z7901 Long term (current) use of anticoagulants: Secondary | ICD-10-CM

## 2021-04-05 DIAGNOSIS — Z7951 Long term (current) use of inhaled steroids: Secondary | ICD-10-CM

## 2021-04-05 DIAGNOSIS — R079 Chest pain, unspecified: Secondary | ICD-10-CM | POA: Diagnosis not present

## 2021-04-05 DIAGNOSIS — Z9842 Cataract extraction status, left eye: Secondary | ICD-10-CM

## 2021-04-05 DIAGNOSIS — K279 Peptic ulcer, site unspecified, unspecified as acute or chronic, without hemorrhage or perforation: Secondary | ICD-10-CM

## 2021-04-05 DIAGNOSIS — R601 Generalized edema: Secondary | ICD-10-CM | POA: Diagnosis present

## 2021-04-05 DIAGNOSIS — E785 Hyperlipidemia, unspecified: Secondary | ICD-10-CM | POA: Diagnosis present

## 2021-04-05 DIAGNOSIS — Z933 Colostomy status: Secondary | ICD-10-CM

## 2021-04-05 DIAGNOSIS — N1832 Chronic kidney disease, stage 3b: Secondary | ICD-10-CM | POA: Diagnosis present

## 2021-04-05 DIAGNOSIS — Z9049 Acquired absence of other specified parts of digestive tract: Secondary | ICD-10-CM

## 2021-04-05 DIAGNOSIS — I4821 Permanent atrial fibrillation: Secondary | ICD-10-CM | POA: Diagnosis not present

## 2021-04-05 DIAGNOSIS — Z85038 Personal history of other malignant neoplasm of large intestine: Secondary | ICD-10-CM

## 2021-04-05 DIAGNOSIS — R112 Nausea with vomiting, unspecified: Secondary | ICD-10-CM

## 2021-04-05 DIAGNOSIS — Z79899 Other long term (current) drug therapy: Secondary | ICD-10-CM

## 2021-04-05 DIAGNOSIS — J918 Pleural effusion in other conditions classified elsewhere: Secondary | ICD-10-CM | POA: Diagnosis not present

## 2021-04-05 LAB — BASIC METABOLIC PANEL
Anion gap: 11 (ref 5–15)
BUN: 38 mg/dL — ABNORMAL HIGH (ref 8–23)
CO2: 27 mmol/L (ref 22–32)
Calcium: 9 mg/dL (ref 8.9–10.3)
Chloride: 99 mmol/L (ref 98–111)
Creatinine, Ser: 1.65 mg/dL — ABNORMAL HIGH (ref 0.44–1.00)
GFR, Estimated: 30 mL/min — ABNORMAL LOW (ref >60)
Glucose, Bld: 118 mg/dL — ABNORMAL HIGH (ref 70–99)
Potassium: 3.4 mmol/L — ABNORMAL LOW (ref 3.5–5.1)
Sodium: 137 mmol/L (ref 135–145)

## 2021-04-05 LAB — CBC
HCT: 34.8 % — ABNORMAL LOW (ref 36.0–46.0)
Hemoglobin: 10.2 g/dL — ABNORMAL LOW (ref 12.0–15.0)
MCH: 26.3 pg (ref 26.0–34.0)
MCHC: 29.3 g/dL — ABNORMAL LOW (ref 30.0–36.0)
MCV: 89.7 fL (ref 80.0–100.0)
Platelets: 127 10*3/uL — ABNORMAL LOW (ref 150–400)
RBC: 3.88 MIL/uL (ref 3.87–5.11)
RDW: 18.7 % — ABNORMAL HIGH (ref 11.5–15.5)
WBC: 9.6 10*3/uL (ref 4.0–10.5)
nRBC: 0 % (ref 0.0–0.2)

## 2021-04-05 LAB — TROPONIN I (HIGH SENSITIVITY)
Troponin I (High Sensitivity): 12 ng/L (ref <18)
Troponin I (High Sensitivity): 14 ng/L (ref <18)

## 2021-04-05 LAB — BRAIN NATRIURETIC PEPTIDE: B Natriuretic Peptide: 528 pg/mL — ABNORMAL HIGH (ref 0.0–100.0)

## 2021-04-05 NOTE — ED Triage Notes (Signed)
Patient reports left chest pain radiating to left upper back with SOB and emesis . No cough or fever , her cardiologist is Dr. [name]. Patient added increasing swelling/edema at bilateral lower legs .

## 2021-04-05 NOTE — ED Provider Notes (Signed)
Emergency Medicine Provider Triage Evaluation Note  [name] , a 87 y.o. female  was evaluated in triage.  Pt complains of chest pain.  Patient states that the pain is located under her left breast and does not radiate.  Some associated emesis.  Also endorses increase in leg swelling.  Chest pain is pleuritic in nature.  Review of Systems  Positive: Chest pain, shortness of breath, nausea, vomiting Negative: Fevers, chills  Physical Exam  BP 101/64 (BP Location: Left Arm)   Pulse (!) 115   Temp 98.7 F (37.1 C) (Oral)   Resp 20   Ht 5\' 3"  (1.6 m)   SpO2 100%   BMI 33.13 kg/m  Gen:   Awake, no distress   Resp:  Normal effort, lung sounds diminished in lower lobes MSK:   Moves extremities without difficulty  Other: 3+ Pitting edema noted to bilateral lower extremities   Medical Decision Making  Medically screening exam initiated at 7:31 PM.  Appropriate orders placed.  Rheagan C Kawecki was informed that the remainder of the evaluation will be completed by another provider, this initial triage assessment does not replace that evaluation, and the importance of remaining in the ED until their evaluation is complete.     Nestor Lewandowsky 04/05/21 1933    Lennice Sites, DO 04/05/21 1950

## 2021-04-06 ENCOUNTER — Inpatient Hospital Stay (HOSPITAL_COMMUNITY): Payer: HMO | Admitting: Anesthesiology

## 2021-04-06 ENCOUNTER — Ambulatory Visit (HOSPITAL_BASED_OUTPATIENT_CLINIC_OR_DEPARTMENT_OTHER): Payer: HMO | Admitting: Family

## 2021-04-06 ENCOUNTER — Encounter (HOSPITAL_COMMUNITY)
Admission: EM | Disposition: A | Discharge status: Hospice | Payer: Self-pay | Source: Home / Self Care | Attending: Internal Medicine

## 2021-04-06 ENCOUNTER — Encounter (HOSPITAL_COMMUNITY): Payer: Self-pay | Admitting: Emergency Medicine

## 2021-04-06 ENCOUNTER — Telehealth: Payer: Self-pay | Admitting: Pharmacist

## 2021-04-06 ENCOUNTER — Emergency Department (HOSPITAL_COMMUNITY): Payer: HMO

## 2021-04-06 ENCOUNTER — Encounter (HOSPITAL_BASED_OUTPATIENT_CLINIC_OR_DEPARTMENT_OTHER): Payer: Self-pay

## 2021-04-06 DIAGNOSIS — Z20822 Contact with and (suspected) exposure to covid-19: Secondary | ICD-10-CM | POA: Diagnosis present

## 2021-04-06 DIAGNOSIS — I2510 Atherosclerotic heart disease of native coronary artery without angina pectoris: Secondary | ICD-10-CM | POA: Diagnosis present

## 2021-04-06 DIAGNOSIS — R52 Pain, unspecified: Secondary | ICD-10-CM | POA: Diagnosis not present

## 2021-04-06 DIAGNOSIS — K2990 Gastroduodenitis, unspecified, without bleeding: Secondary | ICD-10-CM | POA: Diagnosis present

## 2021-04-06 DIAGNOSIS — K449 Diaphragmatic hernia without obstruction or gangrene: Secondary | ICD-10-CM | POA: Diagnosis present

## 2021-04-06 DIAGNOSIS — N179 Acute kidney failure, unspecified: Secondary | ICD-10-CM | POA: Diagnosis not present

## 2021-04-06 DIAGNOSIS — R0902 Hypoxemia: Secondary | ICD-10-CM | POA: Diagnosis not present

## 2021-04-06 DIAGNOSIS — R933 Abnormal findings on diagnostic imaging of other parts of digestive tract: Secondary | ICD-10-CM

## 2021-04-06 DIAGNOSIS — R072 Precordial pain: Secondary | ICD-10-CM | POA: Diagnosis present

## 2021-04-06 DIAGNOSIS — M810 Age-related osteoporosis without current pathological fracture: Secondary | ICD-10-CM | POA: Diagnosis present

## 2021-04-06 DIAGNOSIS — E1122 Type 2 diabetes mellitus with diabetic chronic kidney disease: Secondary | ICD-10-CM | POA: Diagnosis present

## 2021-04-06 DIAGNOSIS — I130 Hypertensive heart and chronic kidney disease with heart failure and stage 1 through stage 4 chronic kidney disease, or unspecified chronic kidney disease: Secondary | ICD-10-CM | POA: Diagnosis present

## 2021-04-06 DIAGNOSIS — J90 Pleural effusion, not elsewhere classified: Secondary | ICD-10-CM | POA: Diagnosis not present

## 2021-04-06 DIAGNOSIS — Q399 Congenital malformation of esophagus, unspecified: Secondary | ICD-10-CM | POA: Diagnosis not present

## 2021-04-06 DIAGNOSIS — I5033 Acute on chronic diastolic (congestive) heart failure: Secondary | ICD-10-CM | POA: Diagnosis present

## 2021-04-06 DIAGNOSIS — E876 Hypokalemia: Secondary | ICD-10-CM | POA: Diagnosis present

## 2021-04-06 DIAGNOSIS — Z96653 Presence of artificial knee joint, bilateral: Secondary | ICD-10-CM | POA: Diagnosis present

## 2021-04-06 DIAGNOSIS — I4819 Other persistent atrial fibrillation: Secondary | ICD-10-CM | POA: Diagnosis present

## 2021-04-06 DIAGNOSIS — K2970 Gastritis, unspecified, without bleeding: Secondary | ICD-10-CM | POA: Diagnosis present

## 2021-04-06 DIAGNOSIS — Z66 Do not resuscitate: Secondary | ICD-10-CM | POA: Diagnosis not present

## 2021-04-06 DIAGNOSIS — K279 Peptic ulcer, site unspecified, unspecified as acute or chronic, without hemorrhage or perforation: Secondary | ICD-10-CM | POA: Diagnosis not present

## 2021-04-06 DIAGNOSIS — J9601 Acute respiratory failure with hypoxia: Secondary | ICD-10-CM | POA: Diagnosis present

## 2021-04-06 DIAGNOSIS — R1013 Epigastric pain: Secondary | ICD-10-CM

## 2021-04-06 DIAGNOSIS — R109 Unspecified abdominal pain: Secondary | ICD-10-CM

## 2021-04-06 DIAGNOSIS — R601 Generalized edema: Secondary | ICD-10-CM | POA: Diagnosis present

## 2021-04-06 DIAGNOSIS — Z961 Presence of intraocular lens: Secondary | ICD-10-CM | POA: Diagnosis present

## 2021-04-06 DIAGNOSIS — I4821 Permanent atrial fibrillation: Secondary | ICD-10-CM | POA: Diagnosis not present

## 2021-04-06 DIAGNOSIS — Z7189 Other specified counseling: Secondary | ICD-10-CM | POA: Diagnosis not present

## 2021-04-06 DIAGNOSIS — E785 Hyperlipidemia, unspecified: Secondary | ICD-10-CM | POA: Diagnosis present

## 2021-04-06 DIAGNOSIS — F32A Depression, unspecified: Secondary | ICD-10-CM | POA: Diagnosis present

## 2021-04-06 DIAGNOSIS — K224 Dyskinesia of esophagus: Secondary | ICD-10-CM | POA: Diagnosis present

## 2021-04-06 DIAGNOSIS — R0789 Other chest pain: Secondary | ICD-10-CM | POA: Diagnosis not present

## 2021-04-06 DIAGNOSIS — N1832 Chronic kidney disease, stage 3b: Secondary | ICD-10-CM | POA: Diagnosis present

## 2021-04-06 DIAGNOSIS — F419 Anxiety disorder, unspecified: Secondary | ICD-10-CM | POA: Diagnosis present

## 2021-04-06 DIAGNOSIS — Z515 Encounter for palliative care: Secondary | ICD-10-CM | POA: Diagnosis not present

## 2021-04-06 DIAGNOSIS — R54 Age-related physical debility: Secondary | ICD-10-CM | POA: Diagnosis present

## 2021-04-06 DIAGNOSIS — R627 Adult failure to thrive: Secondary | ICD-10-CM | POA: Diagnosis not present

## 2021-04-06 HISTORY — PX: ESOPHAGOGASTRODUODENOSCOPY (EGD) WITH PROPOFOL: SHX5813

## 2021-04-06 LAB — HEPATIC FUNCTION PANEL
ALT: 23 U/L (ref 0–44)
AST: 33 U/L (ref 15–41)
Albumin: 3.7 g/dL (ref 3.5–5.0)
Alkaline Phosphatase: 56 U/L (ref 38–126)
Bilirubin, Direct: 0.3 mg/dL — ABNORMAL HIGH (ref 0.0–0.2)
Indirect Bilirubin: 0.3 mg/dL (ref 0.3–0.9)
Total Bilirubin: 0.6 mg/dL (ref 0.3–1.2)
Total Protein: 7.6 g/dL (ref 6.5–8.1)

## 2021-04-06 LAB — RESP PANEL BY RT-PCR (FLU A&B, COVID) ARPGX2
Influenza A by PCR: NEGATIVE
Influenza B by PCR: NEGATIVE
SARS Coronavirus 2 by RT PCR: NEGATIVE

## 2021-04-06 LAB — LIPASE, BLOOD: Lipase: 45 U/L (ref 11–51)

## 2021-04-06 SURGERY — ESOPHAGOGASTRODUODENOSCOPY (EGD) WITH PROPOFOL
Anesthesia: Monitor Anesthesia Care

## 2021-04-06 MED ORDER — SUCRALFATE 1 GM/10ML PO SUSP
1.0000 g | Freq: Three times a day (TID) | ORAL | Status: DC
  Filled 2021-04-06 (×3): qty 10

## 2021-04-06 MED ORDER — PANTOPRAZOLE SODIUM 40 MG IV SOLR: 40.0000 mg | Freq: Two times a day (BID) | INTRAVENOUS | Status: DC

## 2021-04-06 MED ORDER — PROPOFOL 10 MG/ML IV BOLUS
INTRAVENOUS | Status: DC | PRN
  Administered 2021-04-06: 40 mg via INTRAVENOUS

## 2021-04-06 MED ORDER — OXYCODONE HCL 5 MG PO TABS
5.0000 mg | ORAL_TABLET | Freq: Four times a day (QID) | ORAL | Status: DC | PRN
  Administered 2021-04-07: 5 mg via ORAL
  Filled 2021-04-06: qty 1

## 2021-04-06 MED ORDER — POLYETHYLENE GLYCOL 3350 17 G PO PACK: 17.0000 g | PACK | Freq: Every day | ORAL | Status: DC | PRN

## 2021-04-06 MED ORDER — PANTOPRAZOLE INFUSION (NEW) - SIMPLE MED
8.0000 mg/h | INTRAVENOUS | Status: DC
  Administered 2021-04-06: 8 mg/h via INTRAVENOUS
  Filled 2021-04-06: qty 100
  Filled 2021-04-06: qty 80

## 2021-04-06 MED ORDER — PROPOFOL 500 MG/50ML IV EMUL
INTRAVENOUS | Status: DC | PRN
  Administered 2021-04-06: 50 ug/kg/min via INTRAVENOUS

## 2021-04-06 MED ORDER — LACTATED RINGERS IV SOLN: INTRAVENOUS | Status: DC | PRN

## 2021-04-06 MED ORDER — PANTOPRAZOLE 80MG IVPB - SIMPLE MED
80.0000 mg | Freq: Once | INTRAVENOUS | Status: AC
  Administered 2021-04-06: 80 mg via INTRAVENOUS
  Filled 2021-04-06: qty 80

## 2021-04-06 MED ORDER — FUROSEMIDE 10 MG/ML IJ SOLN
80.0000 mg | Freq: Two times a day (BID) | INTRAMUSCULAR | Status: DC
  Administered 2021-04-07 (×2): 80 mg via INTRAVENOUS
  Filled 2021-04-06 (×2): qty 8

## 2021-04-06 MED ORDER — LIDOCAINE 2% (20 MG/ML) 5 ML SYRINGE
INTRAMUSCULAR | Status: DC | PRN
  Administered 2021-04-06: 100 mg via INTRAVENOUS

## 2021-04-06 MED ORDER — POTASSIUM CHLORIDE 10 MEQ/100ML IV SOLN
10.0000 meq | INTRAVENOUS | Status: AC
Start: 2021-04-06 — End: 2021-04-06
  Administered 2021-04-06 (×2): 10 meq via INTRAVENOUS
  Filled 2021-04-06 (×2): qty 100

## 2021-04-06 MED ORDER — FENTANYL CITRATE PF 50 MCG/ML IJ SOSY
25.0000 ug | PREFILLED_SYRINGE | Freq: Once | INTRAMUSCULAR | Status: AC
Start: 2021-04-06 — End: 2021-04-06
  Administered 2021-04-06: 25 ug via INTRAVENOUS
  Filled 2021-04-06: qty 1

## 2021-04-06 MED ORDER — FUROSEMIDE 10 MG/ML IJ SOLN
40.0000 mg | Freq: Once | INTRAMUSCULAR | Status: AC
  Administered 2021-04-06: 40 mg via INTRAVENOUS
  Filled 2021-04-06: qty 4

## 2021-04-06 MED ORDER — ALUM & MAG HYDROXIDE-SIMETH 200-200-20 MG/5ML PO SUSP
30.0000 mL | Freq: Once | ORAL | Status: AC
  Administered 2021-04-06: 30 mL via ORAL
  Filled 2021-04-06: qty 30

## 2021-04-06 MED ORDER — IOHEXOL 350 MG/ML SOLN
75.0000 mL | Freq: Once | INTRAVENOUS | Status: AC | PRN
  Administered 2021-04-06: 75 mL via INTRAVENOUS

## 2021-04-06 MED ORDER — SODIUM CHLORIDE 0.9 % IV SOLN: INTRAVENOUS | Status: DC

## 2021-04-06 MED ORDER — ONDANSETRON HCL 4 MG/2ML IJ SOLN
4.0000 mg | Freq: Once | INTRAMUSCULAR | Status: AC
  Administered 2021-04-06: 4 mg via INTRAVENOUS
  Filled 2021-04-06: qty 2

## 2021-04-06 MED ORDER — HYDROMORPHONE HCL 1 MG/ML IJ SOLN
0.5000 mg | INTRAMUSCULAR | Status: DC | PRN
  Administered 2021-04-06 – 2021-04-07 (×3): 0.5 mg via INTRAVENOUS
  Filled 2021-04-06 (×2): qty 0.5
  Filled 2021-04-06: qty 1
  Filled 2021-04-06: qty 0.5

## 2021-04-06 MED ORDER — PANTOPRAZOLE SODIUM 40 MG PO TBEC
40.0000 mg | DELAYED_RELEASE_TABLET | Freq: Two times a day (BID) | ORAL | Status: DC
  Administered 2021-04-07 – 2021-04-09 (×4): 40 mg via ORAL
  Filled 2021-04-06 (×4): qty 1

## 2021-04-06 MED ORDER — ACETAMINOPHEN 325 MG PO TABS
650.0000 mg | ORAL_TABLET | Freq: Four times a day (QID) | ORAL | Status: DC | PRN
  Administered 2021-04-09: 650 mg via ORAL
  Filled 2021-04-06: qty 2

## 2021-04-06 MED ORDER — VASOPRESSIN 20 UNIT/ML IV SOLN
INTRAVENOUS | Status: DC | PRN
  Administered 2021-04-06 (×2): 1 [IU] via INTRAVENOUS

## 2021-04-06 MED ORDER — UMECLIDINIUM-VILANTEROL 62.5-25 MCG/INH IN AEPB
1.0000 | INHALATION_SPRAY | Freq: Every day | RESPIRATORY_TRACT | Status: DC
  Administered 2021-04-08 – 2021-04-09 (×2): 1 via RESPIRATORY_TRACT
  Filled 2021-04-06 (×2): qty 14

## 2021-04-06 MED ORDER — SUCRALFATE 1 GM/10ML PO SUSP
1.0000 g | Freq: Three times a day (TID) | ORAL | Status: DC
  Administered 2021-04-06 – 2021-04-09 (×8): 1 g via ORAL
  Filled 2021-04-06 (×8): qty 10

## 2021-04-06 SURGICAL SUPPLY — 14 items

## 2021-04-06 NOTE — Consult Note (Signed)
Cardiology Consultation:   Patient ID: [name] MRN: [id number]; DOB: [birth year]  Admit date: 04/05/2021 Date of Consult: 04/06/2021  PCP:  [name], NP   [facility] Providers Cardiologist:  Mertie Moores, MD   Patient Profile:   [name] is a 87 y.o. female with a hx of CAD s/p BMS x 2 to RCA in 3762, chronic diastolic heart failure, COPD, permanent Afib on chronic anticoagulation, hx of DVT and TIA, CKD stage IIIb, prior colon cancer, and chronic anemia,  who is being seen 04/06/2021 for the evaluation of epigastric pain and chest pain at the request of Dr. Dyann Kief.  History of Present Illness:   Ms. [name] was hospitalized 11/10/20-11/13/20 for acute on chronic diastolic heart failure. Echo with LVEF 60-65%, moderately elevated PASP, mild to moderate MR, moderate to severe TR, trivial AI, intraventricular septum flattened in diastole consistent with volume overload. She responded to diuresis. She follows with pulmonology who has adjusted her inhalers. She was seen by Laurann Montana NP 02/09/21 and reported weakness felt likely multifactorial: deconditioning, hypotension, and hypervolemia. Diuretic was increased to 60 mg daily with PRN option. SGLT2i not added due to renal function. She saw Dr. Acie Fredrickson 03/12/21 and felt improved on the increased lasix. Creatinine checked and returned at 1.47, eliquis dose kept at 5 mg BID. Dr. Acie Fredrickson has been suspicious of aspiration PNA in the past due to coughing with eating.   She presented to [facility] with chest pain, SOB, epigastric pain, N/V with abnormal antrum on CT scan. She has a history of nonbleeding superficial small gastric ulcer in 2019. She describes chest pain but points to epigastric area on exam.   Workup in ED: HS troponin x 2 negative BNP 528 sCr 1.65 with K 3.4 Hb 10.2 - baseline Lipase and LFTs WNL COVID negative  GI was consulted for epigastric pain and CT scan suspicious for peptic ulcer disease. Cardiology was consulted to  rule out ACS.   During my exam, she complains of 2-3 weeks of pain radiating from her throat to her epigastric region. Pain has been constant, waxing and waning, with no clear correlation with eating or activity. She chronically sleeps in a recliner with feet elevated. She also reported shortness of breath and lower extremity swelling leading to her increasing lasix to 80 mg for the three days preceding admission. She states that at 3pm yesterday her pain worsened to a 10/10 and has persisted. She has received some pain relief since being in the ER and receiving pain medication. She is no longer short of breath after her increased lasix. She does not remember her prior anginal pain.   With further discussion, she reports very poor PO intake. She "is just not hungry" and doesn't like to cook for one person. She does hydrate with water, but generally only eats once per day and supplements with half of a protein drink daily.     Past Medical History:  Diagnosis Date   Adenocarcinoma, colon [facility]) Oncologist-- Dr Truitt Merle   Multifocal (2) Colon cancer @ ileocecal valve and ascending ( mT4N1Mx), Stage IIIB, grade I, MMR normal , 2 of 16 +lymph nodes, negative surgical margins---  06-02-2014  Right hemicolectomy w/ colostomy   Allergy    Anxiety    Asthma    inhaler "sometimes"   CAD (coronary artery disease)    a. LHC 4/09: pLAD 40, mDx 70-75, pLCx 30, mLCx 50, mRCA 90, EF 60% >> PCI: BMS x2 to RCA;  b. Myoview  8/12: normal   Cataract    bil cateracts removed   Chronic anemia    Chronic diastolic CHF (congestive heart failure) (HCC)    a. Echo 912 - Mild LVH, EF 55-60%, no RWMA, Gr 1 DD, mild MR, mild LAE, mild RAE, PASP 59 mmHg (mod to severe pulmo HTN)   Clotting disorder (HCC)    hx of dvt, tia on eliquis   Colostomy in place [employer])    s/p colostomy takedown 5/16   Colovaginal fistula    s/p colostomy >> colostomy takedown 5/16   COPD with chronic bronchitis (HCC)    Depression     Dysrhythmia    A fib   Essential hypertension    GERD (gastroesophageal reflux disease)    History of adenomatous polyp of colon    History of blood transfusion    History of DVT of lower extremity    History of hiatal hernia    History of TIA (transient ischemic attack)    a. Head CT 6/15: small chronic lacunar infarct in thalamus   Hyperlipidemia    OA (osteoarthritis)    Osteoporosis    Peripheral neuropathy    Pneumonia    Pulmonary HTN ([place])    a. PASP on Echo in 9/12:  59 mmHg   Renal insufficiency    Sigmoid diverticulosis    s/p sigmoid colectomy   Stroke [facility])    TIAs   Wears dentures    Wears glasses     Past Surgical History:  Procedure Laterality Date   ABDOMINAL HYSTERECTOMY     ANTERIOR CERVICAL DECOMP/DISCECTOMY FUSION  01-31-2009   C5 -- 7   APPENDECTOMY     Bone spurs Bilateral    Feet   CARDIOVASCULAR STRESS TEST  02-26-2011   Normal lexiscan no exercise study/  no ischemia/  normal LV function and wall motion, ef 67%   CARPAL TUNNEL RELEASE Right    CATARACT EXTRACTION W/ INTRAOCULAR LENS  IMPLANT, BILATERAL Bilateral    CHOLECYSTECTOMY     COLOSTOMY N/A 06/02/2014   Procedure: DIVERTING DESCENDING END COLOSTOMY;  Surgeon: Donnie Mesa, MD;  Location: [place];  Service: General;  Laterality: N/A;   CORONARY ANGIOPLASTY WITH STENT PLACEMENT  11-04-2007  dr bensimhon   BMS x2 to RCA/  mild Non-obstructive disease LAD/  normal LVF   DILATION AND CURETTAGE OF UTERUS     ESOPHAGOGASTRODUODENOSCOPY N/A 10/28/2017   Procedure: ESOPHAGOGASTRODUODENOSCOPY (EGD);  Surgeon: Yetta Flock, MD;  Location[facility] ENDOSCOPY;  Service: Gastroenterology;  Laterality: N/A;   EVALUATION UNDER ANESTHESIA WITH ANAL FISTULECTOMY N/A 10/13/2014   Procedure: ANAL EXAM UNDER ANESTHESIA ;  Surgeon: [name], MD;  Location: [facility];  Service: General;  Laterality: N/A;   HAND SURGERY     Tendon repair   LAPAROSCOPIC SIGMOID COLECTOMY N/A 11/24/2014   Procedure: SIGMOID  COLECTOMY AND COLSOTOMY CLOSURE;  Surgeon: Donnie Mesa, MD;  Location: [place];  Service: General;  Laterality: N/A;   LUMBAR LAMINECTOMY  10-29-2002,  1969   Left  L3 -- 4  decompression   PARTIAL COLECTOMY N/A 06/02/2014   Procedure: RIGHT PARTIAL COLECTOMY ;  Surgeon: Donnie Mesa, MD;  Location: [place];  Service: General;  Laterality: N/A;   PATELLECTOMY Right 09/14/2013   Procedure: Wendi Maya;  Surgeon: Meredith Pel, MD;  Location: [place];  Service: Orthopedics;  Laterality: Right;   PROCTOSCOPY N/A 10/13/2014   Procedure: RIDGE PROCTOSCOPY;  Surgeon: [name], MD;  Location: [facility];  Service: General;  Laterality: N/A;   REVISION TOTAL KNEE ARTHROPLASTY Bilateral right  04-02-2011/  left 1996 & 10-05-1999   ROTATOR CUFF REPAIR Bilateral    TONSILLECTOMY     TOTAL KNEE ARTHROPLASTY Bilateral left 1994/  right 2000   TOTAL KNEE REVISION Left 08/02/2015   Procedure: LEFT FEMORAL REVISION;  Surgeon: Gaynelle Arabian, MD;  Location: [facility];  Service: Orthopedics;  Laterality: Left;   TRANSTHORACIC ECHOCARDIOGRAM  12-01-2009   Grade I diastolic dysfunction/  ef 60%/  moderate MR/  mild TR     Home Medications:  Prior to Admission medications   Medication Sig Start Date End Date Taking? Authorizing Provider  acetaminophen (TYLENOL) 500 MG tablet Take 1,000 mg by mouth every 6 (six) hours as needed for moderate pain.    Yes [provider]  albuterol (PROAIR HFA) 108 (90 Base) MCG/ACT inhaler Inhale 1-2 puffs into the lungs every 6 (six) hours as needed for wheezing or shortness of breath. 02/28/21  Yes [name], MD  allopurinol (ZYLOPRIM) 100 MG tablet TAKE ONE TABLET BY MOUTH ONCE DAILY Patient taking differently: Take 100 mg by mouth daily. 07/11/20  Yes Nafziger, [name], NP  ANORO ELLIPTA 62.5-25 MCG/INH AEPB Inhale 1 puff into the lungs daily. 02/28/21  Yes [provider]  apixaban (ELIQUIS) 5 MG TABS tablet Take 1 tablet (5 mg total) by mouth 2 (two) times  daily. 02/12/21  Yes Nafziger, [name], NP  Ascorbic Acid (VITAMIN C) 1000 MG tablet Take 1,000 mg by mouth daily.   Yes [provider]  esomeprazole (NEXIUM) 40 MG capsule TAKE ONE CAPSULE BY MOUTH ONCE DAILY Patient taking differently: Take 40 mg by mouth daily. 04/03/21  Yes Nafziger, [name], NP  famotidine (PEPCID) 20 MG tablet TAKE ONE TABLET BY MOUTH EVERYDAY AT BEDTIME Patient taking differently: Take 20 mg by mouth at bedtime. 04/05/21  Yes Martyn Ehrich, NP  ferrous sulfate 325 (65 FE) MG EC tablet Take 325 mg by mouth daily with breakfast.   Yes [provider]  furosemide (LASIX) 20 MG tablet Take three tablets ($RemoveBefore'60mg'[id number]$ ) once per day. Take and additional 3 tablets ($RemoveBe'60mg'VAvZUrMMc$ ) as needed for weight gain of 2 lbs overnight or 5 lbs in one week. 02/09/21  Yes Loel Dubonnet, NP  gabapentin (NEURONTIN) 600 MG tablet TAKE ONE TABLET BY MOUTH TWICE DAILY Patient taking differently: Take 600 mg by mouth 2 (two) times daily. 04/03/21  Yes Nafziger, [name], NP  HYDROcodone-acetaminophen (NORCO/VICODIN) 5-325 MG tablet Take 1 tablet by mouth every 6 (six) hours as needed for severe pain. 11/13/20  Yes Mikhail, Maryann, DO  LORazepam (ATIVAN) 0.5 MG tablet TAKE ONE TABLET BY MOUTH EVERYDAY AT BEDTIME Patient taking differently: Take 0.5 mg by mouth at bedtime. 01/31/21  Yes Nafziger, [name], NP  losartan (COZAAR) 25 MG tablet Take 0.5 tablets (12.5 mg total) by mouth daily. Patient taking differently: Take 25 mg by mouth daily. 01/11/21  Yes Loel Dubonnet, NP  metoprolol tartrate (LOPRESSOR) 50 MG tablet TAKE 1 AND 1/2 TABLETS BY MOUTH TWICE DAILY Patient taking differently: Take 75 mg by mouth 2 (two) times daily. 04/03/21  Yes Nafziger, [name], NP  nitroGLYCERIN (NITROSTAT) 0.4 MG SL tablet Place 1 tablet (0.4 mg total) under the tongue every 5 (five) minutes as needed for chest pain (x 3 pills). Reported on 09/01/2015 04/06/19  Yes Nafziger, [name], NP  sertraline (ZOLOFT) 50 MG tablet TAKE ONE  TABLET BY MOUTH ONCE DAILY Patient taking differently: Take 50 mg by mouth daily.  04/03/21  Yes Nafziger, [name], NP  silver sulfADIAZINE (SILVADENE) 1 % cream Apply 1 application topically 2 (two) times daily as needed (rash). 01/31/20  Yes [provider]  Spacer/Aero-Holding Chambers (E-Z SPACER) inhaler Use as instructed 03/20/18  Yes Koberlein, Steele Berg, MD  traZODone (DESYREL) 100 MG tablet Take 1 tablet (100 mg total) by mouth at bedtime. 03/26/21  Yes Nafziger, [name], NP  triamcinolone cream (KENALOG) 0.1 % Apply 1 application topically 2 (two) times daily.    Yes [provider]  doxycycline (VIBRAMYCIN) 100 MG capsule Take 1 capsule (100 mg total) by mouth 2 (two) times daily. Patient not taking: No sig reported 03/13/21   [name], NP  potassium chloride (KLOR-CON) 10 MEQ tablet Take 1 tab for 3 days Patient not taking: No sig reported 01/12/21   Loel Dubonnet, NP    Inpatient Medications: Scheduled Meds:  [MAR Hold] pantoprazole  40 mg Intravenous Q12H   [MAR Hold] sucralfate  1 g Oral TID   [MAR Hold] umeclidinium-vilanterol  1 puff Inhalation Daily   Continuous Infusions:  sodium chloride     pantoprazole 8 mg/hr (04/06/21 0651)   PRN Meds: [MAR Hold] acetaminophen, [MAR Hold]  HYDROmorphone (DILAUDID) injection, [MAR Hold] oxyCODONE, [MAR Hold] polyethylene glycol  Allergies:   No Known Allergies  Social History:   Social History   Socioeconomic History   Marital status: Widowed    Spouse name: Not on file   Number of children: Not on file   Years of education: Not on file   Highest education level: Not on file  Occupational History   Not on file  Tobacco Use   Smoking status: Former    Packs/day: 0.50    Years: 16.00    Pack years: 8.00    Types: Cigarettes    Start date: 51    Quit date: 07/16/1967    Years since quitting: 53.7   Smokeless tobacco: Never  Vaping Use   Vaping Use: Never used  Substance and Sexual Activity   Alcohol  use: No    Alcohol/week: 0.0 standard drinks   Drug use: No   Sexual activity: Not on file  Other Topics Concern   Not on file  Social History Narrative   Married, 2 children.  As of November 2015 her husband has been living in a nursing home for tendon after years as he suffers from multiple medical problems.   Patient does not drink alcohol nor does she smoke or chew tobacco products   Right handed   8th grade   1 cup daily   Social Determinants of Health   Financial Resource Strain: Low Risk    Difficulty of Paying Living Expenses: Not hard at all  Food Insecurity: No Food Insecurity   Worried About Charity fundraiser in the Last Year: Never true   Ran Out of Food in the Last Year: Never true  Transportation Needs: No Transportation Needs   Lack of Transportation (Medical): No   Lack of Transportation (Non-Medical): No  Physical Activity: Inactive   Days of Exercise per Week: 0 days   Minutes of Exercise per Session: 0 min  Stress: No Stress Concern Present   Feeling of Stress : Not at all  Social Connections: Moderately Isolated   Frequency of Communication with Friends and Family: More than three times a week   Frequency of Social Gatherings with Friends and Family: Once a week   Attends Religious Services: 1 to 4 times per  year   Active Member of Clubs or Organizations: No   Attends Archivist Meetings: Never   Marital Status: Widowed  Human resources officer Violence: Not At Risk   Fear of Current or Ex-Partner: No   Emotionally Abused: No   Physically Abused: No   Sexually Abused: No    Family History:    Family History  Problem Relation Age of Onset   Osteoarthritis Mother    Heart failure Mother    Colon cancer Maternal Uncle 54   Heart Problems Father    Cancer Sister 65       ? colon cancer    Kidney cancer Brother 17   Esophageal cancer Neg Hx    Rectal cancer Neg Hx    Stomach cancer Neg Hx    Pancreatic cancer Neg Hx      ROS:  Please see  the history of present illness.   All other ROS reviewed and negative.     Physical Exam/Data:   Vitals:   04/06/21 1115 04/06/21 1130 04/06/21 1200 04/06/21 1304  BP:  114/83 108/71 112/64  Pulse:  76 72 90  Resp:  (!) 26 (!) 21 20  Temp:   98.6 F (37 C) 97.8 F (36.6 C)  TempSrc:   Oral Oral  SpO2: 93%  97% 98%  Weight:    84.8 kg  Height:    '5\' 3"'$  (1.6 m)    Intake/Output Summary (Last 24 hours) at 04/06/2021 1324 Last data filed at 04/06/2021 0913 Gross per 24 hour  Intake 300 ml  Output --  Net 300 ml   Last 3 Weights 04/06/2021 04/06/2021 03/13/2021  Weight (lbs) 186 lb 15.2 oz 186 lb 15.2 oz 187 lb  Weight (kg) 84.8 kg 84.8 kg 84.823 kg     Body mass index is 33.12 kg/m.  General:  elderly female with some moderate abdominal pain but no distress HEENT: normal Neck: + JVD Vascular: No carotid bruits; Distal pulses 2+ bilaterally Cardiac: irregular rhythm, regular rate  Lungs:  clear to auscultation bilaterally, no wheezing, rhonchi or rales  Abd: soft, nontender, no hepatomegaly  Ext: minimal B LE edema Musculoskeletal:  No deformities, BUE and BLE strength normal and equal Skin: warm and dry  Neuro:  CNs 2-12 intact, no focal abnormalities noted Psych:  Normal affect   EKG:  The EKG was personally reviewed and demonstrates:  atrial fibrillation with ventricular rate 97 Telemetry:  Telemetry was personally reviewed and demonstrates:  atrial fibrillation with rates in the 80-90s  Relevant CV Studies:  Echo 11/11/20:  1. Left ventricular ejection fraction, by estimation, is 60 to 65%. The  left ventricle has normal function. The left ventricle has no regional  wall motion abnormalities. There is mild concentric left ventricular  hypertrophy. Left ventricular diastolic  function could not be evaluated. There is the interventricular septum is  flattened in diastole ('D' shaped left ventricle), consistent with right  ventricular volume overload.   2. Right  ventricular systolic function is normal. The right ventricular  size is normal. There is moderately elevated pulmonary artery systolic  pressure. The estimated right ventricular systolic pressure is 35.6 mmHg.   3. Left atrial size was moderately dilated.   4. Right atrial size was moderately dilated.   5. The mitral valve is normal in structure. Mild to moderate mitral valve  regurgitation.   6. Tricuspid valve regurgitation is moderate to severe.   7. The aortic valve is normal in structure. Aortic valve regurgitation  is  trivial. No aortic stenosis is present.   8. The inferior vena cava is dilated in size with <50% respiratory  variability, suggesting right atrial pressure of 15 mmHg.   Laboratory Data:  High Sensitivity Troponin:   Recent Labs  Lab 04/05/21 1938 04/05/21 2143  TROPONINIHS 12 14     Chemistry Recent Labs  Lab 04/05/21 1938  NA 137  K 3.4*  CL 99  CO2 27  GLUCOSE 118*  BUN 38*  CREATININE 1.65*  CALCIUM 9.0  GFRNONAA 30*  ANIONGAP 11    Recent Labs  Lab 04/05/21 2143  PROT 7.6  ALBUMIN 3.7  AST 33  ALT 23  ALKPHOS 56  BILITOT 0.6   Lipids No results for input(s): CHOL, TRIG, HDL, LABVLDL, LDLCALC, CHOLHDL in the last 168 hours.  Hematology Recent Labs  Lab 04/05/21 1938  WBC 9.6  RBC 3.88  HGB 10.2*  HCT 34.8*  MCV 89.7  MCH 26.3  MCHC 29.3*  RDW 18.7*  PLT 127*   Thyroid No results for input(s): TSH, FREET4 in the last 168 hours.  BNP Recent Labs  Lab 04/05/21 1939  BNP 528.0*    DDimer No results for input(s): DDIMER in the last 168 hours.   Radiology/Studies:  DG Chest 2 View  Result Date: 04/05/2021 CLINICAL DATA:  Chest pain. EXAM: CHEST - 2 VIEW COMPARISON:  Chest x-ray 12/05/2020. FINDINGS: The aorta is tortuous in the cardiac silhouette is enlarged, unchanged. There is some strandy opacities in the lung bases favored as atelectasis. There is no pleural effusion or pneumothorax. Cervical spinal fusion plate is  partially visualized. IMPRESSION: Stable cardiomegaly. No evidence for pneumonia or edema. Electronically Signed   By: Ronney Asters M.D.   On: 04/05/2021 19:54   CT Angio Chest/Abd/Pel for Dissection W and/or Wo Contrast  Result Date: 04/06/2021 CLINICAL DATA:  87 year old female with pain. EXAM: CT ANGIOGRAPHY CHEST, ABDOMEN AND PELVIS TECHNIQUE: Non-contrast CT of the chest was initially obtained. Multidetector CT imaging through the chest, abdomen and pelvis was performed using the standard protocol during bolus administration of intravenous contrast. Multiplanar reconstructed images and MIPs were obtained and reviewed to evaluate the vascular anatomy. CONTRAST:  46mL OMNIPAQUE IOHEXOL 350 MG/ML SOLN COMPARISON:  CTA chest 11/10/2020. CT Abdomen and Pelvis 07/21/2019 and earlier. FINDINGS: CTA CHEST FINDINGS Cardiovascular: Chronic aortic calcified atherosclerosis. Chronic coronary artery calcified atherosclerosis. Mildly tortuous thoracic aorta but negative for aneurysm or dissection. Proximal great vessels appear patent with tortuosity. Contrast in the central pulmonary arteries which appear patent. Stable cardiomegaly. No pericardial effusion. Mediastinum/Nodes: Stable moderate gastric hiatal hernia. No mediastinal mass or lymphadenopathy. Lungs/Pleura: Major airways are patent. Mildly lower lung volumes compared to April, and there is enhancing bilateral lower lobe atelectasis superimposed on new small layering pleural effusions. Chronic scarring in the right middle lobe is stable. Mild mosaic attenuation elsewhere in the lungs is chronic and could reflect small vessel or small airway disease. No consolidation or other acute pulmonary opacity. Musculoskeletal: Prior cervical ACDF. Congenital incomplete segmentation of T12-L1 with underlying scoliosis. Stable thoracic spine degeneration including at the adjacent T11-T12 segment with vacuum disc. Chronically advanced degeneration at both shoulders. No  acute osseous abnormality identified. Review of the MIP images confirms the above findings. CTA ABDOMEN AND PELVIS FINDINGS VASCULAR Aortoiliac calcified atherosclerosis. Mildly tortuous aorta and iliac arteries are stable since last year with no dissection or aneurysm. Major abdominal aortic branches remain patent. Proximal femoral arteries remain patent with calcified atherosclerosis. Review of the  MIP images confirms the above findings. NON-VASCULAR Hepatobiliary: Chronically absent gallbladder.  Stable liver. Pancreas: Negative. Spleen: Negative. Adrenals/Urinary Tract: Stable, negative. Chronic pelvic phleboliths. Stomach/Bowel: Chronic sigmoid colon anastomosis with no adverse features. Nondilated upstream large bowel, retained stool similar to the CT last year. Chronic postoperative changes also at the cecum with neo terminal ileum. No dilated small bowel. Chronic gastric hernia. Intra-abdominal stomach remarkable for mild inflammatory stranding along the distal greater curve seen on series 6, image 178 and coronal series 9, image 47. This is proximal to the antrum. The duodenum is decompressed and within normal limits. And no other gastro duodenal inflammation is identified. No free air. No free fluid. Chronic postoperative changes to the ventral abdominal wall. And chronic fat containing inguinal hernias, where as that on the right contains a small volume of complex fluid now on series 6, image 262. But no other complicating features. Intra-abdominal Lymphatic: No lymphadenopathy. Reproductive: Surgically absent uterus. Diminutive or absent ovaries. Other: No pelvic free fluid. Musculoskeletal: Chronic versus congenital lumbar interbody ankylosis, except at L4-L5 and L5-S1 where advanced disc and facet degeneration with vacuum phenomena appears stable. No acute osseous abnormality identified. Review of the MIP images confirms the above findings. IMPRESSION: 1. Negative for aortic dissection or aneurysm.  Aortic Atherosclerosis (ICD10-I70.0). 2. Mild inflammatory stranding along the distal greater curve of the stomach is new since last year and suspicious for acute peptic ulcer disease. No complicating features. 3. New small layering pleural effusions with bilateral lower lobe atelectasis. 4. Otherwise stable CT appearance of the chest, abdomen, and pelvis. Including: Chronic cardiomegaly. Chronic hiatal hernia. Previous partial colectomy and neo-terminal ileum. Electronically Signed   By: Genevie Ann M.D.   On: 04/06/2021 04:40     Assessment and Plan:   Chest pain CAD s/p BMS x 2 to RCA 2009 - hs troponin x 2 negative - EKG with Afib, rate controlled - suspect chest pain is related to esophageal and upper GI pain  - will obtain repeat echo following endoscopy   Chronic diastolic heart failure MR/TR - echo as above - prior to admission, increased lasix to 80 mg daily x 3 days - did not appear extremely volume up on exam   Hypertension - home regimen includes losartan and lopressor - pressures borderline   Persistent atrial fibrillation Chronic anticoagulation - will need to monitor renal function to confirm eliquis dose prior to discharge - restart eliquis when clear from GI    Risk Assessment/Risk Scores:     HEAR Score (for undifferentiated chest pain):  HEAR Score: 4{  New York Heart Association (NYHA) Functional Class NYHA Class II  CHA2DS2-VASc Score = 8  This indicates a 10.8% annual risk of stroke. The patient's score is based upon: CHF History: 1 HTN History: 1 Diabetes History: 0 Stroke History: 2 Vascular Disease History: 1 Age Score: 2 Gender Score: 1       For questions or updates, please contact [facility] Please consult [email or web address] for contact info under    Signed, Ledora Bottcher, PA  04/06/2021 1:24 PM

## 2021-04-06 NOTE — H&P (Signed)
History and Physical  [name] DZH:[id number] DOB: [birth year] DOA: 04/05/2021  Referring physician: Dr. Nicanor Alcon, EDP PCP: Shirline Frees, NP  Outpatient Specialists: GI Patient coming from: Home  Chief Complaint: Epigastric pain  HPI: [name] is a 87 y.o. female with medical history significant for nonbleeding superficial small gastric ulcer in 2019, colon polyps, internal hemorrhoids, hypertension, gout, iron deficiency anemia, chronic anxiety/depression, chronic A. fib on Eliquis, who presented to [facility] ED with complaints of chest pain, shortness of breath.  Mainly points at her epigastric region when asked for the location of her pain.  Onset about 2 weeks ago but worse in the last 24 to 48 hours.  Associated with nausea and vomiting.  She presented to the ED for further evaluation.  Work-up in the ED revealed negative troponin x2, CT scan negative for aortic dissection or aneurysm, mild inflammatory stranding along the distal greater curve of the stomach is new since last year suspicious for acute peptic ulcer disease.  She received Protonix bolus and was started on Protonix drip in the ED.  EDP consulted GI, Dr. Marina Goodell, will see in consultation.  TRH, hospitalist team, was asked to admit.  ED Course: Temperature 98.7.  BP 117/75, pulse 105, respiration rate 20, saturation 99% on room air.  Lab studies remarkable for serum potassium 3.4.  Serum bicarb 27, glucose 118, BUN 38, creatinine 1.65, direct bilirubin 0.3.  BNP 528.  Review of Systems: Review of systems as noted in the HPI. All other systems reviewed and are negative.   Past Medical History:  Diagnosis Date   Adenocarcinoma, colon [facility]) Oncologist-- Dr Malachy Mood   Multifocal (2) Colon cancer @ ileocecal valve and ascending ( mT4N1Mx), Stage IIIB, grade I, MMR normal , 2 of 16 +lymph nodes, negative surgical margins---  06-02-2014  Right hemicolectomy w/ colostomy   Allergy    Anxiety    Asthma    inhaler "sometimes"   CAD  (coronary artery disease)    a. LHC 4/09: pLAD 40, mDx 70-75, pLCx 30, mLCx 50, mRCA 90, EF 60% >> PCI: BMS x2 to RCA;  b. Myoview 8/12: normal   Cataract    bil cateracts removed   Chronic anemia    Chronic diastolic CHF (congestive heart failure) (HCC)    a. Echo 912 - Mild LVH, EF 55-60%, no RWMA, Gr 1 DD, mild MR, mild LAE, mild RAE, PASP 59 mmHg (mod to severe pulmo HTN)   Clotting disorder (HCC)    hx of dvt, tia on eliquis   Colostomy in place [facility])    s/p colostomy takedown 5/16   Colovaginal fistula    s/p colostomy >> colostomy takedown 5/16   COPD with chronic bronchitis (HCC)    Depression    Dysrhythmia    A fib   Essential hypertension    GERD (gastroesophageal reflux disease)    History of adenomatous polyp of colon    History of blood transfusion    History of DVT of lower extremity    History of hiatal hernia    History of TIA (transient ischemic attack)    a. Head CT 6/15: small chronic lacunar infarct in thalamus   Hyperlipidemia    OA (osteoarthritis)    Osteoporosis    Peripheral neuropathy    Pneumonia    Pulmonary HTN (HCC)    a. PASP on Echo in 9/12:  59 mmHg   Renal insufficiency    Sigmoid diverticulosis    s/p sigmoid colectomy  Stroke [facility])    TIAs   Wears dentures    Wears glasses    Past Surgical History:  Procedure Laterality Date   ABDOMINAL HYSTERECTOMY     ANTERIOR CERVICAL DECOMP/DISCECTOMY FUSION  01-31-2009   C5 -- 7   APPENDECTOMY     Bone spurs Bilateral    Feet   CARDIOVASCULAR STRESS TEST  02-26-2011   Normal lexiscan no exercise study/  no ischemia/  normal LV function and wall motion, ef 67%   CARPAL TUNNEL RELEASE Right    CATARACT EXTRACTION W/ INTRAOCULAR LENS  IMPLANT, BILATERAL Bilateral    CHOLECYSTECTOMY     COLOSTOMY N/A 06/02/2014   Procedure: DIVERTING DESCENDING END COLOSTOMY;  Surgeon: Donnie Mesa, MD;  Location: [place];  Service: General;  Laterality: N/A;   CORONARY ANGIOPLASTY WITH STENT PLACEMENT   11-04-2007  dr bensimhon   BMS x2 to RCA/  mild Non-obstructive disease LAD/  normal LVF   DILATION AND CURETTAGE OF UTERUS     ESOPHAGOGASTRODUODENOSCOPY N/A 10/28/2017   Procedure: ESOPHAGOGASTRODUODENOSCOPY (EGD);  Surgeon: Yetta Flock, MD;  Location: [facility] ENDOSCOPY;  Service: Gastroenterology;  Laterality: N/A;   EVALUATION UNDER ANESTHESIA WITH ANAL FISTULECTOMY N/A 10/13/2014   Procedure: ANAL EXAM UNDER ANESTHESIA ;  Surgeon: [name], MD;  Location: [facility];  Service: General;  Laterality: N/A;   HAND SURGERY     Tendon repair   LAPAROSCOPIC SIGMOID COLECTOMY N/A 11/24/2014   Procedure: SIGMOID COLECTOMY AND COLSOTOMY CLOSURE;  Surgeon: Donnie Mesa, MD;  Location: [employer];  Service: General;  Laterality: N/A;   LUMBAR LAMINECTOMY  10-29-2002,  1969   Left  L3 -- 4  decompression   PARTIAL COLECTOMY N/A 06/02/2014   Procedure: RIGHT PARTIAL COLECTOMY ;  Surgeon: Donnie Mesa, MD;  Location: [place];  Service: General;  Laterality: N/A;   PATELLECTOMY Right 09/14/2013   Procedure: Wendi Maya;  Surgeon: Meredith Pel, MD;  Location: [place];  Service: Orthopedics;  Laterality: Right;   PROCTOSCOPY N/A 10/13/2014   Procedure: RIDGE PROCTOSCOPY;  Surgeon: [name], MD;  Location: [facility];  Service: General;  Laterality: N/A;   REVISION TOTAL KNEE ARTHROPLASTY Bilateral right  04-02-2011/  left 1996 & 10-05-1999   ROTATOR CUFF REPAIR Bilateral    TONSILLECTOMY     TOTAL KNEE ARTHROPLASTY Bilateral left 6503/  right 2000   TOTAL KNEE REVISION Left 08/02/2015   Procedure: LEFT FEMORAL REVISION;  Surgeon: Gaynelle Arabian, MD;  Location: [facility];  Service: Orthopedics;  Laterality: Left;   TRANSTHORACIC ECHOCARDIOGRAM  12-01-2009   Grade I diastolic dysfunction/  ef 60%/  moderate MR/  mild TR    Social History:  reports that she quit smoking about 53 years ago. Her smoking use included cigarettes. She started smoking about 69 years ago. She has a 8.00 pack-year smoking history.  She has never used smokeless tobacco. She reports that she does not drink alcohol and does not use drugs.   No Known Allergies  Family History  Problem Relation Age of Onset   Osteoarthritis Mother    Heart failure Mother    Colon cancer Maternal Uncle 95   Heart Problems Father    Cancer Sister 24       ? colon cancer    Kidney cancer Brother 65   Esophageal cancer Neg Hx    Rectal cancer Neg Hx    Stomach cancer Neg Hx    Pancreatic cancer Neg Hx       Prior to  Admission medications   Medication Sig Start Date End Date Taking? Authorizing Provider  acetaminophen (TYLENOL) 500 MG tablet Take 1,000 mg by mouth every 6 (six) hours as needed for moderate pain.     [provider]  albuterol (PROAIR HFA) 108 (90 Base) MCG/ACT inhaler Inhale 1-2 puffs into the lungs every 6 (six) hours as needed for wheezing or shortness of breath. 02/28/21   [name], MD  allopurinol (ZYLOPRIM) 100 MG tablet TAKE ONE TABLET BY MOUTH ONCE DAILY 07/11/20   Nafziger, [name], NP  ANORO ELLIPTA 62.5-25 MCG/INH AEPB Inhale 1 puff into the lungs daily. 02/28/21   [provider]  apixaban (ELIQUIS) 5 MG TABS tablet Take 1 tablet (5 mg total) by mouth 2 (two) times daily. 02/12/21   Nafziger, [name], NP  Ascorbic Acid (VITAMIN C) 1000 MG tablet Take 1,000 mg by mouth daily.    [provider]  doxycycline (VIBRAMYCIN) 100 MG capsule Take 1 capsule (100 mg total) by mouth 2 (two) times daily. 03/13/21   Nafziger, [name], NP  esomeprazole (Zaleski) 40 MG capsule TAKE ONE CAPSULE BY MOUTH ONCE DAILY 04/03/21   Nafziger, [name], NP  famotidine (PEPCID) 20 MG tablet TAKE ONE TABLET BY MOUTH EVERYDAY AT BEDTIME 04/05/21   Martyn Ehrich, NP  ferrous sulfate 325 (65 FE) MG EC tablet Take 325 mg by mouth daily with breakfast.    [provider]  furosemide (LASIX) 20 MG tablet Take three tablets ($RemoveBefore'60mg'WODhzPwaSIWHF$ ) once per day. Take and additional 3 tablets ($RemoveBe'60mg'KvJjAPlbE$ ) as needed for weight gain of 2 lbs  overnight or 5 lbs in one week. 02/09/21   Loel Dubonnet, NP  gabapentin (NEURONTIN) 600 MG tablet TAKE ONE TABLET BY MOUTH TWICE DAILY 04/03/21   Nafziger, [name], NP  HYDROcodone-acetaminophen (NORCO/VICODIN) 5-325 MG tablet Take 1 tablet by mouth every 6 (six) hours as needed for severe pain. 11/13/20   Mikhail, Velta Addison, DO  LORazepam (ATIVAN) 0.5 MG tablet TAKE ONE TABLET BY MOUTH EVERYDAY AT BEDTIME 01/31/21   Nafziger, [name], NP  losartan (COZAAR) 25 MG tablet Take 0.5 tablets (12.5 mg total) by mouth daily. 01/11/21   Loel Dubonnet, NP  metoprolol tartrate (LOPRESSOR) 50 MG tablet TAKE 1 AND 1/2 TABLETS BY MOUTH TWICE DAILY 04/03/21   Nafziger, [name], NP  nitroGLYCERIN (NITROSTAT) 0.4 MG SL tablet Place 1 tablet (0.4 mg total) under the tongue every 5 (five) minutes as needed for chest pain (x 3 pills). Reported on 09/01/2015 04/06/19   [name], NP  potassium chloride (KLOR-CON) 10 MEQ tablet Take 1 tab for 3 days 01/12/21   Loel Dubonnet, NP  sertraline (ZOLOFT) 50 MG tablet TAKE ONE TABLET BY MOUTH ONCE DAILY 04/03/21   Nafziger, [name], NP  silver sulfADIAZINE (SILVADENE) 1 % cream Apply 1 application topically 2 (two) times daily. 01/31/20   [provider]  Spacer/Aero-Holding Chambers (E-Z SPACER) inhaler Use as instructed 03/20/18   [name], MD  Tiotropium Bromide Monohydrate (SPIRIVA RESPIMAT) 2.5 MCG/ACT AERS Inhale 2 puffs into the lungs daily at 2 PM. 08/24/20 02/09/21  [name], MD  traZODone (DESYREL) 100 MG tablet Take 1 tablet (100 mg total) by mouth at bedtime. 03/26/21   Nafziger, [name], NP  triamcinolone cream (KENALOG) 0.1 % Apply 1 application topically 2 (two) times daily.     [provider]    Physical Exam: BP 117/75   Pulse 98   Temp 98.7 F (37.1 C) (Oral)   Resp Marland Kitchen)  21   Ht $R'5\' 3"'xx$  (1.6 m)   SpO2 96%   BMI 33.13 kg/m   General: 87 y.o. year-old female well developed well nourished in no acute distress.  Alert and oriented  x3. Cardiovascular: Regular rate and rhythm with no rubs or gallops.  No thyromegaly or JVD noted.  1+ pitting edema in lower extremities bilaterally.   Respiratory: Clear to auscultation with no wheezes or rales. Good inspiratory effort. Abdomen: Soft epigastric tenderness on mild palpation.  Nondistended with normal bowel sounds x4 quadrants. Muskuloskeletal: No cyanosis or clubbing.  1+ pitting edema noted bilaterally Neuro: CN II-XII intact, strength, sensation, reflexes Skin: No ulcerative lesions noted or rashes Psychiatry: Judgement and insight appear normal. Mood is appropriate for condition and setting          Labs on Admission:  Basic Metabolic Panel: Recent Labs  Lab 04/05/21 1938  NA 137  K 3.4*  CL 99  CO2 27  GLUCOSE 118*  BUN 38*  CREATININE 1.65*  CALCIUM 9.0   Liver Function Tests: Recent Labs  Lab 04/05/21 2143  AST 33  ALT 23  ALKPHOS 56  BILITOT 0.6  PROT 7.6  ALBUMIN 3.7   Recent Labs  Lab 04/05/21 2143  LIPASE 45   No results for input(s): AMMONIA in the last 168 hours. CBC: Recent Labs  Lab 04/05/21 1938  WBC 9.6  HGB 10.2*  HCT 34.8*  MCV 89.7  PLT 127*   Cardiac Enzymes: No results for input(s): CKTOTAL, CKMB, CKMBINDEX, TROPONINI in the last 168 hours.  BNP (last 3 results) Recent Labs    11/10/20 1400 04/05/21 1939  BNP 490.6* 528.0*    ProBNP (last 3 results) Recent Labs    01/11/21 1245 01/24/21 1128 02/01/21 1158  PROBNP 3,759* 2,625* 3,607*    CBG: No results for input(s): GLUCAP in the last 168 hours.  Radiological Exams on Admission: DG Chest 2 View  Result Date: 04/05/2021 CLINICAL DATA:  Chest pain. EXAM: CHEST - 2 VIEW COMPARISON:  Chest x-ray 12/05/2020. FINDINGS: The aorta is tortuous in the cardiac silhouette is enlarged, unchanged. There is some strandy opacities in the lung bases favored as atelectasis. There is no pleural effusion or pneumothorax. Cervical spinal fusion plate is partially  visualized. IMPRESSION: Stable cardiomegaly. No evidence for pneumonia or edema. Electronically Signed   By: Ronney Asters M.D.   On: 04/05/2021 19:54   CT Angio Chest/Abd/Pel for Dissection W and/or Wo Contrast  Result Date: 04/06/2021 CLINICAL DATA:  87 year old female with pain. EXAM: CT ANGIOGRAPHY CHEST, ABDOMEN AND PELVIS TECHNIQUE: Non-contrast CT of the chest was initially obtained. Multidetector CT imaging through the chest, abdomen and pelvis was performed using the standard protocol during bolus administration of intravenous contrast. Multiplanar reconstructed images and MIPs were obtained and reviewed to evaluate the vascular anatomy. CONTRAST:  21mL OMNIPAQUE IOHEXOL 350 MG/ML SOLN COMPARISON:  CTA chest 11/10/2020. CT Abdomen and Pelvis 07/21/2019 and earlier. FINDINGS: CTA CHEST FINDINGS Cardiovascular: Chronic aortic calcified atherosclerosis. Chronic coronary artery calcified atherosclerosis. Mildly tortuous thoracic aorta but negative for aneurysm or dissection. Proximal great vessels appear patent with tortuosity. Contrast in the central pulmonary arteries which appear patent. Stable cardiomegaly. No pericardial effusion. Mediastinum/Nodes: Stable moderate gastric hiatal hernia. No mediastinal mass or lymphadenopathy. Lungs/Pleura: Major airways are patent. Mildly lower lung volumes compared to April, and there is enhancing bilateral lower lobe atelectasis superimposed on new small layering pleural effusions. Chronic scarring in the right middle lobe is stable. Mild mosaic attenuation elsewhere  in the lungs is chronic and could reflect small vessel or small airway disease. No consolidation or other acute pulmonary opacity. Musculoskeletal: Prior cervical ACDF. Congenital incomplete segmentation of T12-L1 with underlying scoliosis. Stable thoracic spine degeneration including at the adjacent T11-T12 segment with vacuum disc. Chronically advanced degeneration at both shoulders. No acute osseous  abnormality identified. Review of the MIP images confirms the above findings. CTA ABDOMEN AND PELVIS FINDINGS VASCULAR Aortoiliac calcified atherosclerosis. Mildly tortuous aorta and iliac arteries are stable since last year with no dissection or aneurysm. Major abdominal aortic branches remain patent. Proximal femoral arteries remain patent with calcified atherosclerosis. Review of the MIP images confirms the above findings. NON-VASCULAR Hepatobiliary: Chronically absent gallbladder.  Stable liver. Pancreas: Negative. Spleen: Negative. Adrenals/Urinary Tract: Stable, negative. Chronic pelvic phleboliths. Stomach/Bowel: Chronic sigmoid colon anastomosis with no adverse features. Nondilated upstream large bowel, retained stool similar to the CT last year. Chronic postoperative changes also at the cecum with neo terminal ileum. No dilated small bowel. Chronic gastric hernia. Intra-abdominal stomach remarkable for mild inflammatory stranding along the distal greater curve seen on series 6, image 178 and coronal series 9, image 47. This is proximal to the antrum. The duodenum is decompressed and within normal limits. And no other gastro duodenal inflammation is identified. No free air. No free fluid. Chronic postoperative changes to the ventral abdominal wall. And chronic fat containing inguinal hernias, where as that on the right contains a small volume of complex fluid now on series 6, image 262. But no other complicating features. Intra-abdominal Lymphatic: No lymphadenopathy. Reproductive: Surgically absent uterus. Diminutive or absent ovaries. Other: No pelvic free fluid. Musculoskeletal: Chronic versus congenital lumbar interbody ankylosis, except at L4-L5 and L5-S1 where advanced disc and facet degeneration with vacuum phenomena appears stable. No acute osseous abnormality identified. Review of the MIP images confirms the above findings. IMPRESSION: 1. Negative for aortic dissection or aneurysm. Aortic  Atherosclerosis (ICD10-I70.0). 2. Mild inflammatory stranding along the distal greater curve of the stomach is new since last year and suspicious for acute peptic ulcer disease. No complicating features. 3. New small layering pleural effusions with bilateral lower lobe atelectasis. 4. Otherwise stable CT appearance of the chest, abdomen, and pelvis. Including: Chronic cardiomegaly. Chronic hiatal hernia. Previous partial colectomy and neo-terminal ileum. Electronically Signed   By: Genevie Ann M.D.   On: 04/06/2021 04:40    EKG: I independently viewed the EKG done and my findings are as followed: A. fib rate of 97.  Nonspecific ST-T changes.  QTc 439.  Assessment/Plan Present on Admission:  Epigastric pain  Active Problems:   Epigastric pain  Severe epigastric pain, suspected peptic ulcer disease on CT scan Initial presentation was of chest pain and shortness of breath, and later found to have mainly epigastric pain. Intractable nausea and vomiting Started on Protonix drip in the ED, continue GI, Dr. Henrene Pastor consulted. Suspicion for peptic ulcer disease on CT scan No free air, no free fluid seen N.p.o. until seen by GI Pain control  Chest pain, rule out ACS Possibly related to suspected peptic ulcer disease seen on CT scan 2 sets of troponin negative No evidence of acute ischemia on twelve-lead EKG Last 2D echo was performed 11/11/2020 revealed normal LVEF 60 to 65% Closely monitor on telemetry Patient missed her cardiology appointment today Cardiology consulted via epic  Hypokalemia Serum potassium 3.4 Repleted intravenously Goal potassium greater than 4.0. Goal magnesium greater than 2.0.  CKD 3B She appears to be close to her baseline creatinine 1.65  with GFR 30 Avoid nephrotoxic agent, dehydration and hypotension. Monitor urine output  Chronic diastolic CHF BNP 601 Strict I's and O's and daily weight   At the time of this dictation, patient's home medications were not  reconciled.  Please restart home medications when appropriate. :  DVT prophylaxis: SCDs  Code Status: Full code  Family Communication: Daughter at bedside  Disposition Plan: Admitted to telemetry medical  Consults called: GI consulted by EDP.  Cardiology consulted via epic.  Admission status: Inpatient status.  Patient will require at least 2 midnights for further evaluation and treatment of present condition.   Status is: Inpatient    Dispo: The patient is from: Home.              Anticipated d/c is to: Home.              Patient currently not medically stable for discharge.   Difficult to place patient, not applicable.       Kayleen Memos MD [facility] Pager [phone]  If 7PM-7AM, please contact night-coverage [email or web address] Password [email or web address]  04/06/2021, 5:26 AM

## 2021-04-06 NOTE — Transfer of Care (Signed)
Immediate Anesthesia Transfer of Care Note  Patient: [name]  Procedure(s) Performed: ESOPHAGOGASTRODUODENOSCOPY (EGD) WITH PROPOFOL  Patient Location: PACU  Anesthesia Type:MAC  Level of Consciousness: awake, alert  and oriented  Airway & Oxygen Therapy: Patient Spontanous Breathing and Patient connected to nasal cannula oxygen  Post-op Assessment: Report given to RN and Post -op Vital signs reviewed and stable  Post vital signs: Reviewed and stable  Last Vitals:  Vitals Value Taken Time  BP 96/54 04/06/21 1412  Temp 36.2 C 04/06/21 1402  Pulse 83 04/06/21 1414  Resp 24 04/06/21 1414  SpO2 91 % 04/06/21 1414  Vitals shown include unvalidated device data.  Last Pain:  Vitals:   04/06/21 1412  TempSrc:   PainSc: 4       Patients Stated Pain Goal: 2 (52/77/82 4235)  Complications: No notable events documented.

## 2021-04-06 NOTE — ED Provider Notes (Addendum)
[facility] EMERGENCY DEPARTMENT Provider Note   CSN: [id number] Arrival date & time: 04/05/21  1853     History Chief Complaint  Patient presents with   Chest Pain / SOB     [name] is a 87 y.o. female.  The history is provided by the patient.  Chest Pain Pain location:  Substernal area Pain quality: dull and radiating   Pain quality comment:  To the back Pain radiates to:  Does not radiate Pain severity:  Moderate Onset quality:  Gradual Duration:  1 day Timing:  Constant Progression:  Unchanged Chronicity:  New Relieved by:  Rest Worsened by:  Nothing Ineffective treatments:  None tried Associated symptoms: lower extremity edema, nausea, shortness of breath and vomiting   Associated symptoms: no fever   Risk factors: not female       Past Medical History:  Diagnosis Date   Adenocarcinoma, colon ([place]) Oncologist-- Dr Truitt Merle   Multifocal (2) Colon cancer @ ileocecal valve and ascending ( [id number]), Stage IIIB, grade I, MMR normal , 2 of 16 +lymph nodes, negative surgical margins---  06-02-2014  Right hemicolectomy w/ colostomy   Allergy    Anxiety    Asthma    inhaler "sometimes"   CAD (coronary artery disease)    a. LHC 4/09: pLAD 40, mDx 70-75, pLCx 36, mLCx 50, mRCA 90, EF 60% >> PCI: BMS x2 to RCA;  b. Myoview 8/12: normal   Cataract    bil cateracts removed   Chronic anemia    Chronic diastolic CHF (congestive heart failure) ([place])    a. Echo 912 - Mild LVH, EF 55-60%, no RWMA, Gr 1 DD, mild MR, mild LAE, mild RAE, PASP 59 mmHg (mod to severe pulmo HTN)   Clotting disorder (HCC)    hx of dvt, tia on eliquis   Colostomy in place [facility])    s/p colostomy takedown 5/16   Colovaginal fistula    s/p colostomy >> colostomy takedown 5/16   COPD with chronic bronchitis (HCC)    Depression    Dysrhythmia    A fib   Essential hypertension    GERD (gastroesophageal reflux disease)    History of adenomatous polyp of colon    History of  blood transfusion    History of DVT of lower extremity    History of hiatal hernia    History of TIA (transient ischemic attack)    a. Head CT 6/15: small chronic lacunar infarct in thalamus   Hyperlipidemia    OA (osteoarthritis)    Osteoporosis    Peripheral neuropathy    Pneumonia    Pulmonary HTN ([place])    a. PASP on Echo in 9/12:  59 mmHg   Renal insufficiency    Sigmoid diverticulosis    s/p sigmoid colectomy   Stroke [facility])    TIAs   Wears dentures    Wears glasses     Patient Active Problem List   Diagnosis Date Noted   CHF exacerbation ([place]) 11/10/2020   Mediastinal adenopathy 01/21/2020   Cellulitis 08/03/2019   CKD (chronic kidney disease), stage III ([place]) 08/03/2019   Pulmonary emphysema ([place]) 04/06/2019   Gastric ulcer    Thrombocytopenia ([place]) 10/27/2017   Abnormal stress test 10/27/2017   Moderate mitral regurgitation 10/27/2017   CAP (community acquired pneumonia) 10/26/2017   Displaced fracture of lateral malleolus of right fibula, subsequent encounter for closed fracture with routine healing 12/27/2016   Iron deficiency anemia 11/12/2016   Chronic  anticoagulation 11/12/2016   Chronic atrial fibrillation ([place]) 08/05/2016   Coronary artery disease of native artery of native heart with stable angina pectoris ([place]) 08/05/2016   Failed total left knee replacement ([place]) 08/02/2015   Sigmoid diverticulitis 11/24/2014   Neuropathic pain 06/13/2014   Insomnia 06/13/2014   Anxiety and depression 06/13/2014   Cancer of ascending colon ([place]) 06/13/2014   Colovaginal fistula 04/20/2014   Lethargy 09/15/2013   Chest pain, musculoskeletal 02/19/2011   Cough 03/14/2010   Depression 02/07/2010   Dysuria 11/08/2009   Chronic diastolic CHF (congestive heart failure) ([place]) 02/27/2009   Pure hypercholesterolemia 04/05/2008   Coronary atherosclerosis 04/05/2008   Osteoarthritis 05/19/2007   Essential hypertension 01/21/2007   GERD 01/21/2007   Hiatal hernia  01/21/2007   Diverticulosis of large intestine 01/21/2007   COLONIC POLYPS, HX OF 01/21/2007    Past Surgical History:  Procedure Laterality Date   ABDOMINAL HYSTERECTOMY     ANTERIOR CERVICAL DECOMP/DISCECTOMY FUSION  01-31-2009   C5 -- 7   APPENDECTOMY     Bone spurs Bilateral    Feet   CARDIOVASCULAR STRESS TEST  02-26-2011   Normal lexiscan no exercise study/  no ischemia/  normal LV function and wall motion, ef 67%   CARPAL TUNNEL RELEASE Right    CATARACT EXTRACTION W/ INTRAOCULAR LENS  IMPLANT, BILATERAL Bilateral    CHOLECYSTECTOMY     COLOSTOMY N/A 06/02/2014   Procedure: DIVERTING DESCENDING END COLOSTOMY;  Surgeon: Donnie Mesa, MD;  Location: [place];  Service: General;  Laterality: N/A;   CORONARY ANGIOPLASTY WITH STENT PLACEMENT  11-04-2007  dr bensimhon   BMS x2 to RCA/  mild Non-obstructive disease LAD/  normal LVF   DILATION AND CURETTAGE OF UTERUS     ESOPHAGOGASTRODUODENOSCOPY N/A 10/28/2017   Procedure: ESOPHAGOGASTRODUODENOSCOPY (EGD);  Surgeon: Yetta Flock, MD;  Location: [facility] ENDOSCOPY;  Service: Gastroenterology;  Laterality: N/A;   EVALUATION UNDER ANESTHESIA WITH ANAL FISTULECTOMY N/A 10/13/2014   Procedure: ANAL EXAM UNDER ANESTHESIA ;  Surgeon: [name], MD;  Location: [facility];  Service: General;  Laterality: N/A;   HAND SURGERY     Tendon repair   LAPAROSCOPIC SIGMOID COLECTOMY N/A 11/24/2014   Procedure: SIGMOID COLECTOMY AND COLSOTOMY CLOSURE;  Surgeon: Donnie Mesa, MD;  Location: [place];  Service: General;  Laterality: N/A;   LUMBAR LAMINECTOMY  10-29-2002,  1969   Left  L3 -- 4  decompression   PARTIAL COLECTOMY N/A 06/02/2014   Procedure: RIGHT PARTIAL COLECTOMY ;  Surgeon: Donnie Mesa, MD;  Location: [place];  Service: General;  Laterality: N/A;   PATELLECTOMY Right 09/14/2013   Procedure: Wendi Maya;  Surgeon: Meredith Pel, MD;  Location: [place];  Service: Orthopedics;  Laterality: Right;   PROCTOSCOPY N/A 10/13/2014   Procedure:  RIDGE PROCTOSCOPY;  Surgeon: [name], MD;  Location: [facility];  Service: General;  Laterality: N/A;   REVISION TOTAL KNEE ARTHROPLASTY Bilateral right  04-02-2011/  left 1996 & 10-05-1999   ROTATOR CUFF REPAIR Bilateral    TONSILLECTOMY     TOTAL KNEE ARTHROPLASTY Bilateral left 2119/  right 2000   TOTAL KNEE REVISION Left 08/02/2015   Procedure: LEFT FEMORAL REVISION;  Surgeon: Gaynelle Arabian, MD;  Location: [facility];  Service: Orthopedics;  Laterality: Left;   TRANSTHORACIC ECHOCARDIOGRAM  12-01-2009   Grade I diastolic dysfunction/  ef 60%/  moderate MR/  mild TR     OB History   No obstetric history on file.     Family History  Problem Relation Age of Onset   Osteoarthritis Mother    Heart failure Mother    Colon cancer Maternal Uncle 60   Heart Problems Father    Cancer Sister 61       ? colon cancer    Kidney cancer Brother 23   Esophageal cancer Neg Hx    Rectal cancer Neg Hx    Stomach cancer Neg Hx    Pancreatic cancer Neg Hx     Social History   Tobacco Use   Smoking status: Former    Packs/day: 0.50    Years: 16.00    Pack years: 8.00    Types: Cigarettes    Start date: 30    Quit date: 07/16/1967    Years since quitting: 53.7   Smokeless tobacco: Never  Vaping Use   Vaping Use: Never used  Substance Use Topics   Alcohol use: No    Alcohol/week: 0.0 standard drinks   Drug use: No    Home Medications Prior to Admission medications   Medication Sig Start Date End Date Taking? Authorizing Provider  acetaminophen (TYLENOL) 500 MG tablet Take 1,000 mg by mouth every 6 (six) hours as needed for moderate pain.     [provider]  albuterol (PROAIR HFA) 108 (90 Base) MCG/ACT inhaler Inhale 1-2 puffs into the lungs every 6 (six) hours as needed for wheezing or shortness of breath. 02/28/21   [name], MD  allopurinol (ZYLOPRIM) 100 MG tablet TAKE ONE TABLET BY MOUTH ONCE DAILY 07/11/20   Nafziger, [name], NP  ANORO ELLIPTA 62.5-25 MCG/INH  AEPB Inhale 1 puff into the lungs daily. 02/28/21   [provider]  apixaban (ELIQUIS) 5 MG TABS tablet Take 1 tablet (5 mg total) by mouth 2 (two) times daily. 02/12/21   Nafziger, [name], NP  Ascorbic Acid (VITAMIN C) 1000 MG tablet Take 1,000 mg by mouth daily.    [provider]  doxycycline (VIBRAMYCIN) 100 MG capsule Take 1 capsule (100 mg total) by mouth 2 (two) times daily. 03/13/21   Nafziger, [name], NP  esomeprazole ([place]) 40 MG capsule TAKE ONE CAPSULE BY MOUTH ONCE DAILY 04/03/21   Nafziger, [name], NP  famotidine (PEPCID) 20 MG tablet TAKE ONE TABLET BY MOUTH EVERYDAY AT BEDTIME 04/05/21   Martyn Ehrich, NP  ferrous sulfate 325 (65 FE) MG EC tablet Take 325 mg by mouth daily with breakfast.    [provider]  furosemide (LASIX) 20 MG tablet Take three tablets ($RemoveBefore'60mg'ELFpjSrHjWoQy$ ) once per day. Take and additional 3 tablets ($RemoveBe'60mg'bEDBtQbIF$ ) as needed for weight gain of 2 lbs overnight or 5 lbs in one week. 02/09/21   Loel Dubonnet, NP  gabapentin (NEURONTIN) 600 MG tablet TAKE ONE TABLET BY MOUTH TWICE DAILY 04/03/21   Nafziger, [name], NP  HYDROcodone-acetaminophen (NORCO/VICODIN) 5-325 MG tablet Take 1 tablet by mouth every 6 (six) hours as needed for severe pain. 11/13/20   Mikhail, Velta Addison, DO  LORazepam (ATIVAN) 0.5 MG tablet TAKE ONE TABLET BY MOUTH EVERYDAY AT BEDTIME 01/31/21   Nafziger, [name], NP  losartan (COZAAR) 25 MG tablet Take 0.5 tablets (12.5 mg total) by mouth daily. 01/11/21   Loel Dubonnet, NP  metoprolol tartrate (LOPRESSOR) 50 MG tablet TAKE 1 AND 1/2 TABLETS BY MOUTH TWICE DAILY 04/03/21   Nafziger, [name], NP  nitroGLYCERIN (NITROSTAT) 0.4 MG SL tablet Place 1 tablet (0.4 mg total) under the tongue every 5 (five) minutes as needed for chest pain (x 3 pills). Reported on 09/01/2015  04/06/19   Nafziger, [name], NP  potassium chloride (KLOR-CON) 10 MEQ tablet Take 1 tab for 3 days 01/12/21   Loel Dubonnet, NP  sertraline (ZOLOFT) 50 MG tablet TAKE ONE TABLET BY MOUTH ONCE  DAILY 04/03/21   Nafziger, [name], NP  silver sulfADIAZINE (SILVADENE) 1 % cream Apply 1 application topically 2 (two) times daily. 01/31/20   [provider]  Spacer/Aero-Holding Chambers (E-Z SPACER) inhaler Use as instructed 03/20/18   [name], MD  Tiotropium Bromide Monohydrate (SPIRIVA RESPIMAT) 2.5 MCG/ACT AERS Inhale 2 puffs into the lungs daily at 2 PM. 08/24/20 02/09/21  [name], MD  traZODone (DESYREL) 100 MG tablet Take 1 tablet (100 mg total) by mouth at bedtime. 03/26/21   Nafziger, [name], NP  triamcinolone cream (KENALOG) 0.1 % Apply 1 application topically 2 (two) times daily.     [provider]    Allergies    Patient has no known allergies.  Review of Systems   Review of Systems  Constitutional:  Negative for fever.  HENT:  Negative for congestion.   Eyes:  Negative for photophobia.  Respiratory:  Positive for shortness of breath.   Cardiovascular:  Positive for chest pain.  Gastrointestinal:  Positive for nausea and vomiting.  All other systems reviewed and are negative.  Physical Exam Updated Vital Signs BP 103/68   Pulse 86   Temp 98.7 F (37.1 C) (Oral)   Resp (!) 24   Ht $R'5\' 3"'js$  (1.6 m)   SpO2 100%   BMI 33.13 kg/m   Physical Exam Vitals and nursing note reviewed.  Constitutional:      General: She is not in acute distress.    Appearance: Normal appearance.  HENT:     Head: Normocephalic and atraumatic.     Nose: Rhinorrhea present.  Eyes:     Conjunctiva/sclera: Conjunctivae normal.     Pupils: Pupils are equal, round, and reactive to light.  Cardiovascular:     Rate and Rhythm: Normal rate and regular rhythm.     Pulses: Normal pulses.     Heart sounds: Normal heart sounds.  Pulmonary:     Effort: Pulmonary effort is normal.     Breath sounds: Normal breath sounds. No rales.  Abdominal:     General: Abdomen is flat. Bowel sounds are normal.     Palpations: Abdomen is soft.     Tenderness: There is no abdominal  tenderness. There is no guarding.  Musculoskeletal:     Cervical back: Normal range of motion and neck supple.     Right lower leg: Edema present.     Left lower leg: Edema present.  Skin:    General: Skin is warm and dry.     Capillary Refill: Capillary refill takes less than 2 seconds.  Neurological:     General: No focal deficit present.     Mental Status: She is alert.     Deep Tendon Reflexes: Reflexes normal.  Psychiatric:        Mood and Affect: Mood normal.        Behavior: Behavior normal.    ED Results / Procedures / Treatments   Labs (all labs ordered are listed, but only abnormal results are displayed) Results for orders placed or performed during the hospital encounter of 40/97/35  Basic metabolic panel  Result Value Ref Range   Sodium 137 135 - 145 mmol/L   Potassium 3.4 (L) 3.5 - 5.1 mmol/L   Chloride 99 98 - 111  mmol/L   CO2 27 22 - 32 mmol/L   Glucose, Bld 118 (H) 70 - 99 mg/dL   BUN 38 (H) 8 - 23 mg/dL   Creatinine, Ser 1.65 (H) 0.44 - 1.00 mg/dL   Calcium 9.0 8.9 - 10.3 mg/dL   GFR, Estimated 30 (L) >60 mL/min   Anion gap 11 5 - 15  CBC  Result Value Ref Range   WBC 9.6 4.0 - 10.5 K/uL   RBC 3.88 3.87 - 5.11 MIL/uL   Hemoglobin 10.2 (L) 12.0 - 15.0 g/dL   HCT 34.8 (L) 36.0 - 46.0 %   MCV 89.7 80.0 - 100.0 fL   MCH 26.3 26.0 - 34.0 pg   MCHC 29.3 (L) 30.0 - 36.0 g/dL   RDW 18.7 (H) 11.5 - 15.5 %   Platelets 127 (L) 150 - 400 K/uL   nRBC 0.0 0.0 - 0.2 %  Brain natriuretic peptide  Result Value Ref Range   B Natriuretic Peptide 528.0 (H) 0.0 - 100.0 pg/mL  Troponin I (High Sensitivity)  Result Value Ref Range   Troponin I (High Sensitivity) 12 <18 ng/L  Troponin I (High Sensitivity)  Result Value Ref Range   Troponin I (High Sensitivity) 14 <18 ng/L   DG Chest 2 View  Result Date: 04/05/2021 CLINICAL DATA:  Chest pain. EXAM: CHEST - 2 VIEW COMPARISON:  Chest x-ray 12/05/2020. FINDINGS: The aorta is tortuous in the cardiac silhouette is  enlarged, unchanged. There is some strandy opacities in the lung bases favored as atelectasis. There is no pleural effusion or pneumothorax. Cervical spinal fusion plate is partially visualized. IMPRESSION: Stable cardiomegaly. No evidence for pneumonia or edema. Electronically Signed   By: Ronney Asters M.D.   On: 04/05/2021 19:54     EKG EKG Interpretation  Date/Time:  Thursday April 05 2021 19:23:51 EDT Ventricular Rate:  97 PR Interval:    QRS Duration: 80 QT Interval:  346 QTC Calculation: 439 R Axis:   99 Text Interpretation: Atrial fibrillation with premature ventricular or aberrantly conducted complexes Low voltage QRS Confirmed by Dory Horn) on 04/06/2021 2:35:05 AM  Radiology DG Chest 2 View  Result Date: 04/05/2021 CLINICAL DATA:  Chest pain. EXAM: CHEST - 2 VIEW COMPARISON:  Chest x-ray 12/05/2020. FINDINGS: The aorta is tortuous in the cardiac silhouette is enlarged, unchanged. There is some strandy opacities in the lung bases favored as atelectasis. There is no pleural effusion or pneumothorax. Cervical spinal fusion plate is partially visualized. IMPRESSION: Stable cardiomegaly. No evidence for pneumonia or edema. Electronically Signed   By: Ronney Asters M.D.   On: 04/05/2021 19:54    Procedures Procedures   Medications Ordered in ED Medications  pantoprazole (PROTONIX) 80 mg /NS 100 mL IVPB (has no administration in time range)  pantoprozole (PROTONIX) 80 mg /NS 100 mL infusion (has no administration in time range)  pantoprazole (PROTONIX) injection 40 mg (has no administration in time range)  furosemide (LASIX) injection 40 mg (has no administration in time range)  ondansetron (ZOFRAN) injection 4 mg (4 mg Intravenous Given 04/06/21 0353)  alum & mag hydroxide-simeth (MAALOX/MYLANTA) 200-200-20 MG/5ML suspension 30 mL (30 mLs Oral Given 04/06/21 0426)  fentaNYL (SUBLIMAZE) injection 25 mcg (25 mcg Intravenous Given 04/06/21 0427)  iohexol (OMNIPAQUE) 350  MG/ML injection 75 mL (75 mLs Intravenous Contrast Given 04/06/21 0419)    ED Course  I have reviewed the triage vital signs and the nursing notes.  Pertinent labs & imaging results that were available during my care  of the patient were reviewed by me and considered in my medical decision making (see chart for details).   [name] was evaluated in Emergency Department on 04/06/2021 for the symptoms described in the history of present illness. She was evaluated in the context of the global COVID-19 pandemic, which necessitated consideration that the patient might be at risk for infection with the SARS-CoV-2 virus that causes COVID-19. Institutional protocols and algorithms that pertain to the evaluation of patients at risk for COVID-19 are in a state of rapid change based on information released by regulatory bodies including the CDC and federal and state organizations. These policies and algorithms were followed during the patient's care in the ED.  Final Clinical Impression(s) / ED Diagnoses Final diagnoses:  None   Admit to medicine     Deandrea Rion, MD 04/06/21 [id number]

## 2021-04-06 NOTE — Progress Notes (Signed)
Patient seen and examined; admitted after midnight secondary to abd pain (epigastric region), anorexia and N/V. Work up suggesting extensive gastritis and acute stomach peptic ulcer, no perforation or free air seen on CT scan. Anticoagulation placed on hold and GI services consulted. Continue IV ppi and carafate. Patient will be kept NPO for now. Please refer to H&P written by Dr. Nevada Crane for further info/details on admission.  Plan: -continue PPI -continue antiemetics, analgesics and supportive care -anticoagulation on hold -follow GI rec's and EGD findings -Carafate has been added to her regimen. -follow clinical response -Hgb stable and not overt bleeding described.   Barton Dubois MD [phone]

## 2021-04-06 NOTE — ED Notes (Signed)
Returned from [employer]. VSS. No signs of distress. Daughter at bedside.

## 2021-04-06 NOTE — Chronic Care Management (AMB) (Addendum)
Chronic Care Management Pharmacy Assistant   Name: [name]  MRN: [id number] DOB: [birth year]   Reason for Encounter: Medication Review / Monthly Pharmacy Call and Hypertension Assessment Call   Conditions to be addressed/monitored: Atrial Fibrillation, CAD, HTN, HLD, and CKD Stage 3   Recent office visits:  None  Recent consult visits:  None  Hospital visits:   Patient was seen at [facility] ED on 04/05/2021 due to Chest pain. Discharge date was 04/06/2021. Discharged from [place]?Medications Started at [facility] Discharge:?? None Medication Changes at Hospital Discharge: None Medications Discontinued at Hospital Discharge: None Follow up with [name]    Medications: Facility-Administered Encounter Medications as of 04/06/2021  Medication   acetaminophen (TYLENOL) tablet 650 mg   HYDROmorphone (DILAUDID) injection 0.5 mg   oxyCODONE (Oxy IR/ROXICODONE) immediate release tablet 5 mg   [START ON 04/09/2021] pantoprazole (PROTONIX) injection 40 mg   pantoprozole (PROTONIX) 80 mg /NS 100 mL infusion   polyethylene glycol (MIRALAX / GLYCOLAX) packet 17 g   umeclidinium-vilanterol (ANORO ELLIPTA) 62.5-25 MCG/INH 1 puff   Outpatient Encounter Medications as of 04/06/2021  Medication Sig   acetaminophen (TYLENOL) 500 MG tablet Take 1,000 mg by mouth every 6 (six) hours as needed for moderate pain.    albuterol (PROAIR HFA) 108 (90 Base) MCG/ACT inhaler Inhale 1-2 puffs into the lungs every 6 (six) hours as needed for wheezing or shortness of breath.   allopurinol (ZYLOPRIM) 100 MG tablet TAKE ONE TABLET BY MOUTH ONCE DAILY   ANORO ELLIPTA 62.5-25 MCG/INH AEPB Inhale 1 puff into the lungs daily.   apixaban (ELIQUIS) 5 MG TABS tablet Take 1 tablet (5 mg total) by mouth 2 (two) times daily.   Ascorbic Acid (VITAMIN C) 1000 MG tablet Take 1,000 mg by mouth daily.   doxycycline (VIBRAMYCIN) 100 MG capsule Take 1  capsule (100 mg total) by mouth 2 (two) times daily.   esomeprazole (NEXIUM) 40 MG capsule TAKE ONE CAPSULE BY MOUTH ONCE DAILY   famotidine (PEPCID) 20 MG tablet TAKE ONE TABLET BY MOUTH EVERYDAY AT BEDTIME   ferrous sulfate 325 (65 FE) MG EC tablet Take 325 mg by mouth daily with breakfast.   furosemide (LASIX) 20 MG tablet Take three tablets (60mg ) once per day. Take and additional 3 tablets (60mg ) as needed for weight gain of 2 lbs overnight or 5 lbs in one week.   gabapentin (NEURONTIN) 600 MG tablet TAKE ONE TABLET BY MOUTH TWICE DAILY   HYDROcodone-acetaminophen (NORCO/VICODIN) 5-325 MG tablet Take 1 tablet by mouth every 6 (six) hours as needed for severe pain.   LORazepam (ATIVAN) 0.5 MG tablet TAKE ONE TABLET BY MOUTH EVERYDAY AT BEDTIME   losartan (COZAAR) 25 MG tablet Take 0.5 tablets (12.5 mg total) by mouth daily.   metoprolol tartrate (LOPRESSOR) 50 MG tablet TAKE 1 AND 1/2 TABLETS BY MOUTH TWICE DAILY   nitroGLYCERIN (NITROSTAT) 0.4 MG SL tablet Place 1 tablet (0.4 mg total) under the tongue every 5 (five) minutes as needed for chest pain (x 3 pills). Reported on 09/01/2015   potassium chloride (KLOR-CON) 10 MEQ tablet Take 1 tab for 3 days   sertraline (ZOLOFT) 50 MG tablet TAKE ONE TABLET BY MOUTH ONCE DAILY   silver sulfADIAZINE (SILVADENE) 1 % cream Apply 1 application topically 2 (two) times daily.   Spacer/Aero-Holding Chambers (E-Z SPACER) inhaler Use as instructed   Tiotropium Bromide Monohydrate (SPIRIVA RESPIMAT) 2.5 MCG/ACT AERS Inhale 2  puffs into the lungs daily at 2 PM.   traZODone (DESYREL) 100 MG tablet Take 1 tablet (100 mg total) by mouth at bedtime.   triamcinolone cream (KENALOG) 0.1 % Apply 1 application topically 2 (two) times daily.    Fill History:  allopurinol 100 mg tablet 01/11/2021 90   esomeprazole magnesium 40 mg capsule,delayed release 01/11/2021 90   famotidine 20 mg tablet 01/11/2021 90   furosemide 20 mg tablet 03/27/2021 21   gabapentin  600 mg tablet 01/11/2021 90   losartan 25 mg tablet 01/11/2021 90   metoprolol tartrate 50 mg tablet 01/11/2021 90   NITROGLYCER 0.4MG    SUB 04/06/2019 10   potassium chloride ER 10 mEq tablet,extended release 01/15/2021 30   sertraline 50 mg tablet 01/11/2021 90   Spiriva Respimat 2.5 mcg/actuation solution for inhalation 08/24/2020 90   Anoro Ellipta 62.5 mcg-25 mcg/actuation powder for inhalation 02/28/2021 30   trazodone 100 mg tablet 03/27/2021 21   Reviewed chart prior to disease state call. Spoke with patient regarding BP  Recent Office Vitals: BP Readings from Last 3 Encounters:  04/10/21 (P) 115/67  03/13/21 134/76  03/12/21 138/76   [name] Readings from Last 3 Encounters:  04/09/21 60  03/13/21 86  03/12/21 87    Wt Readings from Last 3 Encounters:  04/07/21 201 lb (91.2 kg)  03/13/21 187 lb (84.8 kg)  03/12/21 187 lb 3.2 oz (84.9 kg)     Kidney Function Lab Results  Component Value Date/Time   CREATININE 2.70 (H) 04/09/2021 12:54 AM   CREATININE 1.95 (H) 04/08/2021 01:04 AM   CREATININE 1.3 (H) 02/17/2017 09:32 AM   CREATININE 1.5 (H) 09/10/2016 08:54 AM   GFR 32.06 (L) 10/05/2019 10:12 AM   GFRNONAA 16 (L) 04/09/2021 12:54 AM   GFRAA 37 (L) 06/06/2020 11:28 AM    BMP Latest Ref Rng & Units 04/09/2021 04/08/2021 04/07/2021  Glucose 70 - 99 mg/dL 180(H) 97 103(H)  BUN 8 - 23 mg/dL 48(H) 36(H) 32(H)  Creatinine 0.44 - 1.00 mg/dL 2.70(H) 1.95(H) 1.49(H)  BUN/Creat Ratio 12 - 28 - - -  Sodium 135 - 145 mmol/L 131(L) 133(L) 134(L)  Potassium 3.5 - 5.1 mmol/L 4.1 4.3 4.2  Chloride 98 - 111 mmol/L 97(L) 98 100  CO2 22 - 32 mmol/L 24 27 26   Calcium 8.9 - 10.3 mg/dL 8.4(L) 8.3(L) 8.6(L)    Current antihypertensive regimen:  Losartan 25 mg take 1/2 tablet daily Metoprolol Tartrate 50mg  take 1 1/2 tablets twice daily   [place] chart for medication changes ahead of medication coordination call.  No OVs, Consults, or hospital visits  since last care coordination call/Pharmacist visit. (If appropriate, list visit date, provider name)  No medication changes indicated OR if recent visit, treatment plan here.  BP Readings from Last 3 Encounters:  04/06/21 106/69  03/13/21 134/76  03/12/21 138/76    Lab Results  Component Value Date   HGBA1C 5.9 (H) 05/31/2014     Patient obtains medications through Adherence Packaging  90 Days   Last adherence delivery included:  Allopurinol 100 mg: one tablet by mouth once daily Famotidine 20 mg: one tablet by mouth at bedtime Gabapentin 600 mg: one tablet by mouth twice daily Sertraline 50 mg: one tablet by mouth daily Esomeprazole 40 mg: one capsule by mouth daily Metoprolol Tartrate 50 mg: one and half (1.5) tablet twice a day Losartan 25 mg: one half tablet once daily   Notes:  Spoke with daughter [name], patient is  currently in the hospital and she is likely being moved to Hospice. [name] will call us back if patient needs continues care and refills from [place].  Ned Clines called [facility] hear care, spoke with Aaron Edelman and requested 104 DS of Lasix be sent to [facility] per pharmacy for future fills.  Care Gaps:  AWV - scheduled for 05/30/2021 Zoster vaccine - never done Covid 19 vaccine booster 3 - overdue Flu vaccine - due  Star Rating Drugs:  Losartan 25mg  - last filled 01/11/2021 90DS at [place] [phone]

## 2021-04-06 NOTE — Anesthesia Preprocedure Evaluation (Addendum)
Anesthesia Evaluation  Patient identified by MRN, date of birth, ID band Patient awake    Reviewed: Allergy & Precautions, NPO status , Patient's Chart, lab work & pertinent test results  Airway Mallampati: II  TM Distance: >3 FB     Dental   Pulmonary asthma , COPD, former smoker,    breath sounds clear to auscultation       Cardiovascular hypertension, Pt. on medications and Pt. on home beta blockers + CAD, + Cardiac Stents and +CHF  + dysrhythmias + Valvular Problems/Murmurs MR  Rhythm:Regular Rate:Normal  LV Function is normal. RV volume overload. Severe TR. Mild/mod TR.   Neuro/Psych CVA    GI/Hepatic Neg liver ROS, hiatal hernia, PUD, GERD  ,  Endo/Other  negative endocrine ROS  Renal/GU Renal InsufficiencyRenal disease     Musculoskeletal  (+) Arthritis ,   Abdominal   Peds  Hematology  (+) anemia ,   Anesthesia Other Findings   Reproductive/Obstetrics                            Anesthesia Physical Anesthesia Plan  ASA: 3  Anesthesia Plan: MAC   Post-op Pain Management:    Induction:   PONV Risk Score and Plan: 2 and Propofol infusion and Treatment Meath vary due to age or medical condition  Airway Management Planned: Natural Airway and Nasal Cannula  Additional Equipment:   Intra-op Plan:   Post-operative Plan:   Informed Consent: I have reviewed the patients History and Physical, chart, labs and discussed the procedure including the risks, benefits and alternatives for the proposed anesthesia with the patient or authorized representative who has indicated his/her understanding and acceptance.       Plan Discussed with: CRNA  Anesthesia Plan Comments:         Anesthesia Quick Evaluation

## 2021-04-06 NOTE — Anesthesia Postprocedure Evaluation (Signed)
Anesthesia Post Note  Patient: [name]  Procedure(s) Performed: ESOPHAGOGASTRODUODENOSCOPY (EGD) WITH PROPOFOL     Patient location during evaluation: PACU Anesthesia Type: MAC Level of consciousness: awake and alert Pain management: pain level controlled Vital Signs Assessment: post-procedure vital signs reviewed and stable Respiratory status: spontaneous breathing, nonlabored ventilation, respiratory function stable and patient connected to nasal cannula oxygen Cardiovascular status: stable and blood pressure returned to baseline Postop Assessment: no apparent nausea or vomiting Anesthetic complications: no   No notable events documented.  Last Vitals:  Vitals:   04/06/21 1412 04/06/21 1419  BP: (!) 96/54 (!) 103/52  Pulse: 81 66  Resp: (!) 25 (!) 23  Temp:    SpO2: 95% 96%    Last Pain:  Vitals:   04/06/21 1419  TempSrc:   PainSc: [place]

## 2021-04-06 NOTE — ED Notes (Signed)
Pt having nausea and vomiting. Triage RN notified

## 2021-04-06 NOTE — ED Notes (Signed)
Pt transported to ENDO 

## 2021-04-06 NOTE — Progress Notes (Signed)
HOSPITAL MEDICINE OVERNIGHT EVENT NOTE     Nursing states patient is requesting that her diet be reinitiated status post EGD, patient is currently NPO.  EGD report reviewed, GI recommends resumption of clear liquid diet and advance as tolerated.  Clear liquid diet ordered.  Whitney Emerald  MD [facility]

## 2021-04-06 NOTE — Op Note (Addendum)
[facility] Patient Name: [name] Procedure Date : 04/06/2021 MRN: [id number] Attending MD: Gatha Mayer , MD Date of Birth: 1929/09/06 CSN: [id number] Age: 87 Admit Type: Outpatient Procedure:                Upper GI endoscopy Indications:              Epigastric abdominal pain, Abnormal CT of the GI                            tract, Nausea with vomiting Providers:                Gatha Mayer, MD, Particia Nearing, RN, Frazier Richards, Technician, Cletis Athens, Technician Referring MD:              Medicines:                Propofol per Anesthesia, Monitored Anesthesia Care Complications:            No immediate complications. Estimated Blood Loss:     Estimated blood loss: none. Procedure:                Pre-Anesthesia Assessment:                           - Prior to the procedure, a History and Physical                            was performed, and patient medications and                            allergies were reviewed. The patient's tolerance of                            previous anesthesia was also reviewed. The risks                            and benefits of the procedure and the sedation                            options and risks were discussed with the patient.                            All questions were answered, and informed consent                            was obtained. Prior Anticoagulants: The patient                            last took Eliquis (apixaban) 1 day prior to the                            procedure. ASA Grade Assessment: III - A patient  with severe systemic disease. After reviewing the                            risks and benefits, the patient was deemed in                            satisfactory condition to undergo the procedure.                           After obtaining informed consent, the endoscope was                            passed under direct vision. Throughout the                             procedure, the patient's blood pressure, pulse, and                            oxygen saturations were monitored continuously. The                            [id number]-[id number] ([id number] Olympus endoscope was introduced                            through the mouth, and advanced to the second part                            of duodenum. The upper GI endoscopy was                            accomplished without difficulty. The patient                            tolerated the procedure well. Scope In: Scope Out: Findings:      The examined esophagus was mildly tortuous.      An 8 cm hiatal hernia was present.      Localized mild inflammation characterized by erythema and granularity       was found on the greater curvature of the stomach.      The examined duodenum was normal.      The gastroesophageal flap valve was visualized endoscopically and       classified as Hill Grade IV (no fold, wide open lumen, hiatal hernia       present). Impression:               I think this is the cause of her symptoms. ? if it                            could be paraesophageal at times and twist and                            obstruct and cause pain and vomiting  Focal and I think could be traumatic from                            symptomatic hiatal hernia and/or the vomiting she                            had - not sure but nothing sinister seen here                           - Tortuous esophagus.                           - 8 cm hiatal hernia.                           - Gastritis.                           - Normal examined duodenum.                           - Gastroesophageal flap valve classified as Hill                            Grade IV (no fold, wide open lumen, hiatal hernia                            present).                           - No specimens collected.                           - Daughter updated by phone Recommendation:           - Return  patient to hospital ward for ongoing care.                           - Advance diet as tolerated - advance as tolerated                            to clear liquid diet.                           - Not much we can do - not a surgical candidate -                            add sucralfate as done, bid PPI oral - will f/u                            tomorrow                           - OK to resume Eliquis Procedure Code(s):        --- Professional ---  63875, Esophagogastroduodenoscopy, flexible,                            transoral; diagnostic, including collection of                            specimen(s) by brushing or washing, when performed                            (separate procedure) Diagnosis Code(s):        --- Professional ---                           Q39.9, Congenital malformation of esophagus,                            unspecified                           K44.9, Diaphragmatic hernia without obstruction or                            gangrene                           K29.70, Gastritis, unspecified, without bleeding                           R10.13, Epigastric pain                           R11.2, Nausea with vomiting, unspecified                           R93.3, Abnormal findings on diagnostic imaging of                            other parts of digestive tract CPT copyright 2019 [employer]. All rights reserved. The codes documented in this report are preliminary and upon coder review Brue  be revised to meet current compliance requirements. Gatha Mayer, MD 04/06/2021 2:05:22 PM This report has been signed electronically. Number of Addenda: 0

## 2021-04-06 NOTE — ED Notes (Signed)
Patient returned from CT

## 2021-04-06 NOTE — Consult Note (Signed)
Consultation  Referring Provider:      Primary Care Physician:  [name], NP Primary Gastroenterologist:         Reason for Consultation:          Impression / Plan:   Epigastric pain nausea and vomiting abnormal antrum on CT scan. Recent doxycycline use  Cause not clear I wonder if doxycycline caused a gastritis and perhaps an esophagitis.  She has been on a regular PPI.  Given her signs and symptoms and the abnormal CT scan will perform EGD later today.  The risks and benefits as well as alternatives of endoscopic procedure(s) have been discussed and reviewed. All questions answered. The patient agrees to proceed.           HPI:   [name] is a 87 y.o. female with a medical history significant for small gastric ulcer in 2019, colon cancer status post resection, A. fib on Eliquis and a multitude of other problems who presented to the hospital yesterday with epigastric and chest pain and nausea and vomiting.  She had a negative cardiac evaluation and a CT scan negative for aortic dissection or aneurysm but had mild inflammatory stranding along the distal greater curve of the stomach suspicious for peptic ulcer disease.  She has been treated with Protonix, she has been left n.p.o. and her last Eliquis dose was 24 hours ago.  She is feeling somewhat improved.  Recent history notable for a dog scratch requiring doxycycline therapy for 2 weeks starting on August 30.  She took that for 2 weeks.  Timeline not entirely clear but sometime during that or after completion of that course she had some complaints of odynophagia and some chest pain and burning epigastric pain and then had a severe exacerbation of her symptoms which led to presentation.   EGD in 2019 with a small gastric ulcer per Dr. Havery Moros.  She had had an EGD and a colonoscopy without significant abnormalities in 2018 because of iron deficiency anemia, Dr. Henrene Pastor.  She does have a significant hiatal  hernia.  Past Medical History:  Diagnosis Date   Adenocarcinoma, colon [facility]) Oncologist-- Dr Truitt Merle   Multifocal (2) Colon cancer @ ileocecal valve and ascending ( [id number]), Stage IIIB, grade I, MMR normal , 2 of 16 +lymph nodes, negative surgical margins---  06-02-2014  Right hemicolectomy w/ colostomy   Allergy    Anxiety    Asthma    inhaler "sometimes"   CAD (coronary artery disease)    a. LHC 4/09: pLAD 40, mDx 70-75, pLCx 11, mLCx 50, mRCA 90, EF 60% >> PCI: BMS x2 to RCA;  b. Myoview 8/12: normal   Cataract    bil cateracts removed   Chronic anemia    Chronic diastolic CHF (congestive heart failure) ([place])    a. Echo 912 - Mild LVH, EF 55-60%, no RWMA, Gr 1 DD, mild MR, mild LAE, mild RAE, PASP 59 mmHg (mod to severe pulmo HTN)   Clotting disorder (HCC)    hx of dvt, tia on eliquis   Colostomy in place [facility])    s/p colostomy takedown 5/16   Colovaginal fistula    s/p colostomy >> colostomy takedown 5/16   COPD with chronic bronchitis (HCC)    Depression    Dysrhythmia    A fib   Essential hypertension    GERD (gastroesophageal reflux disease)    History of adenomatous polyp of colon    History of blood transfusion  History of DVT of lower extremity    History of hiatal hernia    History of TIA (transient ischemic attack)    a. Head CT 6/15: small chronic lacunar infarct in thalamus   Hyperlipidemia    OA (osteoarthritis)    Osteoporosis    Peripheral neuropathy    Pneumonia    Pulmonary HTN ([place])    a. PASP on Echo in 9/12:  59 mmHg   Renal insufficiency    Sigmoid diverticulosis    s/p sigmoid colectomy   Stroke [facility])    TIAs   Wears dentures    Wears glasses     Past Surgical History:  Procedure Laterality Date   ABDOMINAL HYSTERECTOMY     ANTERIOR CERVICAL DECOMP/DISCECTOMY FUSION  01-31-2009   C5 -- 7   APPENDECTOMY     Bone spurs Bilateral    Feet   CARDIOVASCULAR STRESS TEST  02-26-2011   Normal lexiscan no exercise study/  no ischemia/   normal LV function and wall motion, ef 67%   CARPAL TUNNEL RELEASE Right    CATARACT EXTRACTION W/ INTRAOCULAR LENS  IMPLANT, BILATERAL Bilateral    CHOLECYSTECTOMY     COLOSTOMY N/A 06/02/2014   Procedure: DIVERTING DESCENDING END COLOSTOMY;  Surgeon: Donnie Mesa, MD;  Location: [place];  Service: General;  Laterality: N/A;   CORONARY ANGIOPLASTY WITH STENT PLACEMENT  11-04-2007  dr bensimhon   BMS x2 to RCA/  mild Non-obstructive disease LAD/  normal LVF   DILATION AND CURETTAGE OF UTERUS     ESOPHAGOGASTRODUODENOSCOPY N/A 10/28/2017   Procedure: ESOPHAGOGASTRODUODENOSCOPY (EGD);  Surgeon: Yetta Flock, MD;  Location: [facility] ENDOSCOPY;  Service: Gastroenterology;  Laterality: N/A;   EVALUATION UNDER ANESTHESIA WITH ANAL FISTULECTOMY N/A 10/13/2014   Procedure: ANAL EXAM UNDER ANESTHESIA ;  Surgeon: [name], MD;  Location: [facility];  Service: General;  Laterality: N/A;   HAND SURGERY     Tendon repair   LAPAROSCOPIC SIGMOID COLECTOMY N/A 11/24/2014   Procedure: SIGMOID COLECTOMY AND COLSOTOMY CLOSURE;  Surgeon: Donnie Mesa, MD;  Location: [place];  Service: General;  Laterality: N/A;   LUMBAR LAMINECTOMY  10-29-2002,  1969   Left  L3 -- 4  decompression   PARTIAL COLECTOMY N/A 06/02/2014   Procedure: RIGHT PARTIAL COLECTOMY ;  Surgeon: Donnie Mesa, MD;  Location: [place];  Service: General;  Laterality: N/A;   PATELLECTOMY Right 09/14/2013   Procedure: Wendi Maya;  Surgeon: Meredith Pel, MD;  Location: [place];  Service: Orthopedics;  Laterality: Right;   PROCTOSCOPY N/A 10/13/2014   Procedure: RIDGE PROCTOSCOPY;  Surgeon: [name], MD;  Location: [facility];  Service: General;  Laterality: N/A;   REVISION TOTAL KNEE ARTHROPLASTY Bilateral right  04-02-2011/  left 1996 & 10-05-1999   ROTATOR CUFF REPAIR Bilateral    TONSILLECTOMY     TOTAL KNEE ARTHROPLASTY Bilateral left 9150/  right 2000   TOTAL KNEE REVISION Left 08/02/2015   Procedure: LEFT FEMORAL REVISION;  Surgeon: Gaynelle Arabian, MD;  Location: [facility];  Service: Orthopedics;  Laterality: Left;   TRANSTHORACIC ECHOCARDIOGRAM  12-01-2009   Grade I diastolic dysfunction/  ef 60%/  moderate MR/  mild TR    Family History  Problem Relation Age of Onset   Osteoarthritis Mother    Heart failure Mother    Colon cancer Maternal Uncle 73   Heart Problems Father    Cancer Sister 84       ? colon cancer    Kidney cancer Brother  30   Esophageal cancer Neg Hx    Rectal cancer Neg Hx    Stomach cancer Neg Hx    Pancreatic cancer Neg Hx      Social History   Tobacco Use   Smoking status: Former    Packs/day: 0.50    Years: 16.00    Pack years: 8.00    Types: Cigarettes    Start date: 79    Quit date: 07/16/1967    Years since quitting: 53.7   Smokeless tobacco: Never  Vaping Use   Vaping Use: Never used  Substance Use Topics   Alcohol use: No    Alcohol/week: 0.0 standard drinks   Drug use: No    Prior to Admission medications   Medication Sig Start Date End Date Taking? Authorizing Provider  acetaminophen (TYLENOL) 500 MG tablet Take 1,000 mg by mouth every 6 (six) hours as needed for moderate pain.    Yes [provider]  albuterol (PROAIR HFA) 108 (90 Base) MCG/ACT inhaler Inhale 1-2 puffs into the lungs every 6 (six) hours as needed for wheezing or shortness of breath. 02/28/21  Yes [name], MD  allopurinol (ZYLOPRIM) 100 MG tablet TAKE ONE TABLET BY MOUTH ONCE DAILY Patient taking differently: Take 100 mg by mouth daily. 07/11/20  Yes Nafziger, [name], NP  ANORO ELLIPTA 62.5-25 MCG/INH AEPB Inhale 1 puff into the lungs daily. 02/28/21  Yes [provider]  apixaban (ELIQUIS) 5 MG TABS tablet Take 1 tablet (5 mg total) by mouth 2 (two) times daily. 02/12/21  Yes Nafziger, [name], NP  Ascorbic Acid (VITAMIN C) 1000 MG tablet Take 1,000 mg by mouth daily.   Yes [provider]  esomeprazole (NEXIUM) 40 MG capsule TAKE ONE CAPSULE BY MOUTH ONCE DAILY Patient taking  differently: Take 40 mg by mouth daily. 04/03/21  Yes Nafziger, [name], NP  famotidine (PEPCID) 20 MG tablet TAKE ONE TABLET BY MOUTH EVERYDAY AT BEDTIME Patient taking differently: Take 20 mg by mouth at bedtime. 04/05/21  Yes Martyn Ehrich, NP  ferrous sulfate 325 (65 FE) MG EC tablet Take 325 mg by mouth daily with breakfast.   Yes [provider]  furosemide (LASIX) 20 MG tablet Take three tablets ($RemoveBefore'60mg'RNJFRxhPrOiTn$ ) once per day. Take and additional 3 tablets ($RemoveBe'60mg'uoDspcEUL$ ) as needed for weight gain of 2 lbs overnight or 5 lbs in one week. 02/09/21  Yes Loel Dubonnet, NP  gabapentin (NEURONTIN) 600 MG tablet TAKE ONE TABLET BY MOUTH TWICE DAILY Patient taking differently: Take 600 mg by mouth 2 (two) times daily. 04/03/21  Yes Nafziger, [name], NP  HYDROcodone-acetaminophen (NORCO/VICODIN) 5-325 MG tablet Take 1 tablet by mouth every 6 (six) hours as needed for severe pain. 11/13/20  Yes Mikhail, Maryann, DO  LORazepam (ATIVAN) 0.5 MG tablet TAKE ONE TABLET BY MOUTH EVERYDAY AT BEDTIME Patient taking differently: Take 0.5 mg by mouth at bedtime. 01/31/21  Yes Nafziger, [name], NP  losartan (COZAAR) 25 MG tablet Take 0.5 tablets (12.5 mg total) by mouth daily. Patient taking differently: Take 25 mg by mouth daily. 01/11/21  Yes Loel Dubonnet, NP  metoprolol tartrate (LOPRESSOR) 50 MG tablet TAKE 1 AND 1/2 TABLETS BY MOUTH TWICE DAILY Patient taking differently: Take 75 mg by mouth 2 (two) times daily. 04/03/21  Yes Nafziger, [name], NP  nitroGLYCERIN (NITROSTAT) 0.4 MG SL tablet Place 1 tablet (0.4 mg total) under the tongue every 5 (five) minutes as needed for chest pain (x 3 pills). Reported on 09/01/2015 04/06/19  Yes  Nafziger, [name], NP  sertraline (ZOLOFT) 50 MG tablet TAKE ONE TABLET BY MOUTH ONCE DAILY Patient taking differently: Take 50 mg by mouth daily. 04/03/21  Yes Nafziger, [name], NP  silver sulfADIAZINE (SILVADENE) 1 % cream Apply 1 application topically 2 (two) times daily as needed (rash). 01/31/20   Yes [provider]  Spacer/Aero-Holding Chambers (E-Z SPACER) inhaler Use as instructed 03/20/18  Yes Koberlein, Steele Berg, MD  traZODone (DESYREL) 100 MG tablet Take 1 tablet (100 mg total) by mouth at bedtime. 03/26/21  Yes Nafziger, [name], NP  triamcinolone cream (KENALOG) 0.1 % Apply 1 application topically 2 (two) times daily.    Yes [provider]  doxycycline (VIBRAMYCIN) 100 MG capsule Take 1 capsule (100 mg total) by mouth 2 (two) times daily. Patient not taking: No sig reported 03/13/21   [name], NP  potassium chloride (KLOR-CON) 10 MEQ tablet Take 1 tab for 3 days Patient not taking: No sig reported 01/12/21   Loel Dubonnet, NP    Current Facility-Administered Medications  Medication Dose Route Frequency Provider Last Rate Last Admin   acetaminophen (TYLENOL) tablet 650 mg  650 mg Oral Q6H PRN Irene Pap N, DO       HYDROmorphone (DILAUDID) injection 0.5 mg  0.5 mg Intravenous Q4H PRN Irene Pap N, DO       oxyCODONE (Oxy IR/ROXICODONE) immediate release tablet 5 mg  5 mg Oral Q6H PRN Kayleen Memos, DO       [START ON 04/09/2021] pantoprazole (PROTONIX) injection 40 mg  40 mg Intravenous Q12H Palumbo, April, MD       pantoprozole (PROTONIX) 80 mg /NS 100 mL infusion  8 mg/hr Intravenous Continuous Palumbo, April, MD 10 mL/hr at 04/06/21 0651 8 mg/hr at 04/06/21 0651   polyethylene glycol (MIRALAX / GLYCOLAX) packet 17 g  17 g Oral Daily PRN Irene Pap N, DO       sucralfate (CARAFATE) 1 GM/10ML suspension 1 g  1 g Oral TID Barton Dubois, MD       umeclidinium-vilanterol (ANORO ELLIPTA) 62.5-25 MCG/INH 1 puff  1 puff Inhalation Daily Kayleen Memos, DO       Current Outpatient Medications  Medication Sig Dispense Refill   acetaminophen (TYLENOL) 500 MG tablet Take 1,000 mg by mouth every 6 (six) hours as needed for moderate pain.      albuterol (PROAIR HFA) 108 (90 Base) MCG/ACT inhaler Inhale 1-2 puffs into the lungs every 6 (six) hours as needed for  wheezing or shortness of breath. 6.7 g 6   allopurinol (ZYLOPRIM) 100 MG tablet TAKE ONE TABLET BY MOUTH ONCE DAILY (Patient taking differently: Take 100 mg by mouth daily.) 90 tablet 3   ANORO ELLIPTA 62.5-25 MCG/INH AEPB Inhale 1 puff into the lungs daily.     apixaban (ELIQUIS) 5 MG TABS tablet Take 1 tablet (5 mg total) by mouth 2 (two) times daily. 7 tablet 0   Ascorbic Acid (VITAMIN C) 1000 MG tablet Take 1,000 mg by mouth daily.     esomeprazole (NEXIUM) 40 MG capsule TAKE ONE CAPSULE BY MOUTH ONCE DAILY (Patient taking differently: Take 40 mg by mouth daily.) 90 capsule 1   famotidine (PEPCID) 20 MG tablet TAKE ONE TABLET BY MOUTH EVERYDAY AT BEDTIME (Patient taking differently: Take 20 mg by mouth at bedtime.) 90 tablet 2   ferrous sulfate 325 (65 FE) MG EC tablet Take 325 mg by mouth daily with breakfast.     furosemide (LASIX) 20 MG tablet Take  three tablets ($RemoveBefore'60mg'sdrAmOavqwror$ ) once per day. Take and additional 3 tablets ($RemoveBe'60mg'BqhdaPZkP$ ) as needed for weight gain of 2 lbs overnight or 5 lbs in one week. 135 tablet 2   gabapentin (NEURONTIN) 600 MG tablet TAKE ONE TABLET BY MOUTH TWICE DAILY (Patient taking differently: Take 600 mg by mouth 2 (two) times daily.) 180 tablet 1   HYDROcodone-acetaminophen (NORCO/VICODIN) 5-325 MG tablet Take 1 tablet by mouth every 6 (six) hours as needed for severe pain. 30 tablet 0   LORazepam (ATIVAN) 0.5 MG tablet TAKE ONE TABLET BY MOUTH EVERYDAY AT BEDTIME (Patient taking differently: Take 0.5 mg by mouth at bedtime.) 30 tablet 2   losartan (COZAAR) 25 MG tablet Take 0.5 tablets (12.5 mg total) by mouth daily. (Patient taking differently: Take 25 mg by mouth daily.) 45 tablet 1   metoprolol tartrate (LOPRESSOR) 50 MG tablet TAKE 1 AND 1/2 TABLETS BY MOUTH TWICE DAILY (Patient taking differently: Take 75 mg by mouth 2 (two) times daily.) 270 tablet 1   nitroGLYCERIN (NITROSTAT) 0.4 MG SL tablet Place 1 tablet (0.4 mg total) under the tongue every 5 (five) minutes as needed for  chest pain (x 3 pills). Reported on 09/01/2015 25 tablet 3   sertraline (ZOLOFT) 50 MG tablet TAKE ONE TABLET BY MOUTH ONCE DAILY (Patient taking differently: Take 50 mg by mouth daily.) 90 tablet 1   silver sulfADIAZINE (SILVADENE) 1 % cream Apply 1 application topically 2 (two) times daily as needed (rash).     Spacer/Aero-Holding Chambers (E-Z SPACER) inhaler Use as instructed 1 each 2   traZODone (DESYREL) 100 MG tablet Take 1 tablet (100 mg total) by mouth at bedtime. 90 tablet 0   triamcinolone cream (KENALOG) 0.1 % Apply 1 application topically 2 (two) times daily.      doxycycline (VIBRAMYCIN) 100 MG capsule Take 1 capsule (100 mg total) by mouth 2 (two) times daily. (Patient not taking: No sig reported) 14 capsule 0   potassium chloride (KLOR-CON) 10 MEQ tablet Take 1 tab for 3 days (Patient not taking: No sig reported) 30 tablet 1    Allergies as of 04/05/2021   (No Known Allergies)     Review of Systems:     All other review of systems are negative.       Physical Exam:  Vital signs in last 24 hours: Temp:  [97.7 F (36.5 C)-98.7 F (37.1 C)] 97.7 F (36.5 C) (09/23 0755) Pulse Rate:  [77-136] 96 (09/23 0930) Resp:  [15-26] 25 (09/23 0930) BP: (93-117)/(57-75) 105/69 (09/23 0930) SpO2:  [87 %-100 %] 97 % (09/23 0930) Weight:  [84.8 kg] 84.8 kg (09/23 0753)    General:  Well-developed, well-nourished and in no acute distress looking stated age Eyes:  anicteric. ENT:   Mouth and posterior pharynx free of lesions.  Lungs: Bibasilar crackles that improved with respiration, otherwise clear to auscultation bilaterally. Heart:   S1S2, no rubs, murmurs, gallops. Abdomen: Obese, soft, non-tender, no hepatosplenomegaly, hernia, or mass and BS+.  Multiple surgical scars   Extremities:   Nonpitting edema lower extremities  Neuro:  A&O x 3.  Psych:  appropriate mood and  Affect.   Data Reviewed:   LAB RESULTS: Recent Labs    04/05/21 1938  WBC 9.6  HGB 10.2*   HCT 34.8*  PLT 127*   BMET Recent Labs    04/05/21 1938  NA 137  K 3.4*  CL 99  CO2 27  GLUCOSE 118*  BUN 38*  CREATININE 1.65*  CALCIUM 9.0  LFT Recent Labs    04/05/21 2143  PROT 7.6  ALBUMIN 3.7  AST 33  ALT 23  ALKPHOS 56  BILITOT 0.6  BILIDIR 0.3*  IBILI 0.3    STUDIES: DG Chest 2 View  Result Date: 04/05/2021 CLINICAL DATA:  Chest pain. EXAM: CHEST - 2 VIEW COMPARISON:  Chest x-ray 12/05/2020. FINDINGS: The aorta is tortuous in the cardiac silhouette is enlarged, unchanged. There is some strandy opacities in the lung bases favored as atelectasis. There is no pleural effusion or pneumothorax. Cervical spinal fusion plate is partially visualized. IMPRESSION: Stable cardiomegaly. No evidence for pneumonia or edema. Electronically Signed   By: Ronney Asters M.D.   On: 04/05/2021 19:54   CT Angio Chest/Abd/Pel for Dissection W and/or Wo Contrast  Result Date: 04/06/2021 CLINICAL DATA:  87 year old female with pain. EXAM: CT ANGIOGRAPHY CHEST, ABDOMEN AND PELVIS TECHNIQUE: Non-contrast CT of the chest was initially obtained. Multidetector CT imaging through the chest, abdomen and pelvis was performed using the standard protocol during bolus administration of intravenous contrast. Multiplanar reconstructed images and MIPs were obtained and reviewed to evaluate the vascular anatomy. CONTRAST:  29mL OMNIPAQUE IOHEXOL 350 MG/ML SOLN COMPARISON:  CTA chest 11/10/2020. CT Abdomen and Pelvis 07/21/2019 and earlier. FINDINGS: CTA CHEST FINDINGS Cardiovascular: Chronic aortic calcified atherosclerosis. Chronic coronary artery calcified atherosclerosis. Mildly tortuous thoracic aorta but negative for aneurysm or dissection. Proximal great vessels appear patent with tortuosity. Contrast in the central pulmonary arteries which appear patent. Stable cardiomegaly. No pericardial effusion. Mediastinum/Nodes: Stable moderate gastric hiatal hernia. No mediastinal mass or lymphadenopathy.  Lungs/Pleura: Major airways are patent. Mildly lower lung volumes compared to April, and there is enhancing bilateral lower lobe atelectasis superimposed on new small layering pleural effusions. Chronic scarring in the right middle lobe is stable. Mild mosaic attenuation elsewhere in the lungs is chronic and could reflect small vessel or small airway disease. No consolidation or other acute pulmonary opacity. Musculoskeletal: Prior cervical ACDF. Congenital incomplete segmentation of T12-L1 with underlying scoliosis. Stable thoracic spine degeneration including at the adjacent T11-T12 segment with vacuum disc. Chronically advanced degeneration at both shoulders. No acute osseous abnormality identified. Review of the MIP images confirms the above findings. CTA ABDOMEN AND PELVIS FINDINGS VASCULAR Aortoiliac calcified atherosclerosis. Mildly tortuous aorta and iliac arteries are stable since last year with no dissection or aneurysm. Major abdominal aortic branches remain patent. Proximal femoral arteries remain patent with calcified atherosclerosis. Review of the MIP images confirms the above findings. NON-VASCULAR Hepatobiliary: Chronically absent gallbladder.  Stable liver. Pancreas: Negative. Spleen: Negative. Adrenals/Urinary Tract: Stable, negative. Chronic pelvic phleboliths. Stomach/Bowel: Chronic sigmoid colon anastomosis with no adverse features. Nondilated upstream large bowel, retained stool similar to the CT last year. Chronic postoperative changes also at the cecum with neo terminal ileum. No dilated small bowel. Chronic gastric hernia. Intra-abdominal stomach remarkable for mild inflammatory stranding along the distal greater curve seen on series 6, image 178 and coronal series 9, image 47. This is proximal to the antrum. The duodenum is decompressed and within normal limits. And no other gastro duodenal inflammation is identified. No free air. No free fluid. Chronic postoperative changes to the ventral  abdominal wall. And chronic fat containing inguinal hernias, where as that on the right contains a small volume of complex fluid now on series 6, image 262. But no other complicating features. Intra-abdominal Lymphatic: No lymphadenopathy. Reproductive: Surgically absent uterus. Diminutive or absent ovaries. Other: No pelvic free fluid. Musculoskeletal: Chronic versus congenital lumbar interbody ankylosis, except at L4-L5  and L5-S1 where advanced disc and facet degeneration with vacuum phenomena appears stable. No acute osseous abnormality identified. Review of the MIP images confirms the above findings. IMPRESSION: 1. Negative for aortic dissection or aneurysm. Aortic Atherosclerosis (ICD10-I70.0). 2. Mild inflammatory stranding along the distal greater curve of the stomach is new since last year and suspicious for acute peptic ulcer disease. No complicating features. 3. New small layering pleural effusions with bilateral lower lobe atelectasis. 4. Otherwise stable CT appearance of the chest, abdomen, and pelvis. Including: Chronic cardiomegaly. Chronic hiatal hernia. Previous partial colectomy and neo-terminal ileum. Electronically Signed   By: Genevie Ann M.D.   On: 04/06/2021 04:40       Thanks   LOS: 0 days   '@Nichols Corter'$  Simonne Maffucci, MD, [facility] @  04/06/2021, 10:18 AM

## 2021-04-07 ENCOUNTER — Inpatient Hospital Stay (HOSPITAL_COMMUNITY): Payer: HMO

## 2021-04-07 DIAGNOSIS — Z515 Encounter for palliative care: Secondary | ICD-10-CM

## 2021-04-07 DIAGNOSIS — Z7189 Other specified counseling: Secondary | ICD-10-CM

## 2021-04-07 DIAGNOSIS — I5033 Acute on chronic diastolic (congestive) heart failure: Secondary | ICD-10-CM | POA: Diagnosis not present

## 2021-04-07 DIAGNOSIS — Z66 Do not resuscitate: Secondary | ICD-10-CM

## 2021-04-07 DIAGNOSIS — R0789 Other chest pain: Secondary | ICD-10-CM

## 2021-04-07 LAB — BASIC METABOLIC PANEL
Anion gap: 8 (ref 5–15)
BUN: 32 mg/dL — ABNORMAL HIGH (ref 8–23)
CO2: 26 mmol/L (ref 22–32)
Calcium: 8.6 mg/dL — ABNORMAL LOW (ref 8.9–10.3)
Chloride: 100 mmol/L (ref 98–111)
Creatinine, Ser: 1.49 mg/dL — ABNORMAL HIGH (ref 0.44–1.00)
GFR, Estimated: 33 mL/min — ABNORMAL LOW (ref >60)
Glucose, Bld: 103 mg/dL — ABNORMAL HIGH (ref 70–99)
Potassium: 4.2 mmol/L (ref 3.5–5.1)
Sodium: 134 mmol/L — ABNORMAL LOW (ref 135–145)

## 2021-04-07 LAB — CBC
HCT: 33 % — ABNORMAL LOW (ref 36.0–46.0)
Hemoglobin: 9.7 g/dL — ABNORMAL LOW (ref 12.0–15.0)
MCH: 26.3 pg (ref 26.0–34.0)
MCHC: 29.4 g/dL — ABNORMAL LOW (ref 30.0–36.0)
MCV: 89.4 fL (ref 80.0–100.0)
Platelets: 104 10*3/uL — ABNORMAL LOW (ref 150–400)
RBC: 3.69 MIL/uL — ABNORMAL LOW (ref 3.87–5.11)
RDW: 18.6 % — ABNORMAL HIGH (ref 11.5–15.5)
WBC: 7.5 10*3/uL (ref 4.0–10.5)
nRBC: 0 % (ref 0.0–0.2)

## 2021-04-07 MED ORDER — HYDROMORPHONE HCL 1 MG/ML IJ SOLN
0.5000 mg | INTRAMUSCULAR | Status: DC | PRN
  Administered 2021-04-07 – 2021-04-08 (×4): 0.5 mg via INTRAVENOUS
  Filled 2021-04-07 (×4): qty 0.5

## 2021-04-07 MED ORDER — ALLOPURINOL 100 MG PO TABS
100.0000 mg | ORAL_TABLET | Freq: Every day | ORAL | Status: DC
  Administered 2021-04-07 – 2021-04-09 (×3): 100 mg via ORAL
  Filled 2021-04-07 (×3): qty 1

## 2021-04-07 MED ORDER — APIXABAN 5 MG PO TABS
5.0000 mg | ORAL_TABLET | Freq: Two times a day (BID) | ORAL | Status: DC
  Administered 2021-04-08 – 2021-04-09 (×3): 5 mg via ORAL
  Filled 2021-04-07 (×3): qty 1

## 2021-04-07 MED ORDER — OXYCODONE HCL 5 MG PO TABS
5.0000 mg | ORAL_TABLET | ORAL | Status: DC | PRN
  Administered 2021-04-07 – 2021-04-08 (×3): 5 mg via ORAL
  Filled 2021-04-07 (×3): qty 1

## 2021-04-07 MED ORDER — FAMOTIDINE 20 MG PO TABS
20.0000 mg | ORAL_TABLET | Freq: Every day | ORAL | Status: DC
  Administered 2021-04-07 – 2021-04-08 (×2): 20 mg via ORAL
  Filled 2021-04-07 (×2): qty 1

## 2021-04-07 MED ORDER — METOPROLOL TARTRATE 50 MG PO TABS
75.0000 mg | ORAL_TABLET | Freq: Two times a day (BID) | ORAL | Status: DC
  Administered 2021-04-07 – 2021-04-09 (×5): 75 mg via ORAL
  Filled 2021-04-07 (×5): qty 1

## 2021-04-07 MED ORDER — LORAZEPAM 0.5 MG PO TABS
0.5000 mg | ORAL_TABLET | Freq: Every day | ORAL | Status: DC
  Administered 2021-04-07 – 2021-04-08 (×2): 0.5 mg via ORAL
  Filled 2021-04-07 (×2): qty 1

## 2021-04-07 MED ORDER — GABAPENTIN 600 MG PO TABS
600.0000 mg | ORAL_TABLET | Freq: Two times a day (BID) | ORAL | Status: DC
  Administered 2021-04-07 – 2021-04-09 (×5): 600 mg via ORAL
  Filled 2021-04-07 (×5): qty 1

## 2021-04-07 MED ORDER — TRAZODONE HCL 50 MG PO TABS
100.0000 mg | ORAL_TABLET | Freq: Every day | ORAL | Status: DC
  Administered 2021-04-07 – 2021-04-08 (×2): 100 mg via ORAL
  Filled 2021-04-07 (×2): qty 2

## 2021-04-07 MED ORDER — DICLOFENAC SODIUM 1 % EX GEL
4.0000 g | Freq: Four times a day (QID) | CUTANEOUS | Status: DC
  Administered 2021-04-07 – 2021-04-10 (×13): 4 g via TOPICAL
  Filled 2021-04-07: qty 100

## 2021-04-07 NOTE — Progress Notes (Signed)
Progress Note  Patient Name: [name] Date of Encounter: 04/07/2021  [facility] Cardiologist: Mertie Moores, MD   Subjective   Ongoing epigastric pain  Inpatient Medications    Scheduled Meds:  allopurinol  100 mg Oral Daily   diclofenac Sodium  4 g Topical QID   famotidine  20 mg Oral QHS   furosemide  80 mg Intravenous BID   gabapentin  600 mg Oral BID   LORazepam  0.5 mg Oral QHS   metoprolol tartrate  75 mg Oral BID   pantoprazole  40 mg Oral BID AC   sucralfate  1 g Oral TID WC & HS   traZODone  100 mg Oral QHS   umeclidinium-vilanterol  1 puff Inhalation Daily   Continuous Infusions:  PRN Meds: acetaminophen, HYDROmorphone (DILAUDID) injection, oxyCODONE, polyethylene glycol   Vital Signs    Vitals:   04/06/21 2312 04/07/21 0344 04/07/21 0804 04/07/21 1203  BP:  118/76 115/62 112/74  Pulse: 86 90 (!) 104 (!) 108  Resp: 17 (!) 23 18 19   Temp:  98.8 F (37.1 C) 98.4 F (36.9 C) 98.1 F (36.7 C)  TempSrc:  Oral Oral Oral  SpO2: 94% 99% 97% 94%  Weight:  91.2 kg    Height:        Intake/Output Summary (Last 24 hours) at 04/07/2021 1219 Last data filed at 04/07/2021 1000 Gross per 24 hour  Intake 245 ml  Output 400 ml  Net -155 ml   Last 3 Weights 04/07/2021 04/06/2021 04/06/2021  Weight (lbs) 201 lb 201 lb 14.4 oz 186 lb 15.2 oz  Weight (kg) 91.173 kg 91.581 kg 84.8 kg      Telemetry    afib - Personally Reviewed  ECG    N/a - Personally Reviewed  Physical Exam  N/a GEN: No acute distress.   Neck: elevated jvd Cardiac: irreg, no murmurs, rubs, or gallops.  Respiratory: Clear to auscultation bilaterally. GI: Soft, nontender, non-distended  MS: trace bilateral edema Neuro:  Nonfocal  Psych: Normal affect   Labs    High Sensitivity Troponin:   Recent Labs  Lab 04/05/21 1938 04/05/21 2143  TROPONINIHS 12 14     Chemistry Recent Labs  Lab 04/05/21 1938 04/05/21 2143 04/07/21 0033  NA 137  --  134*  K 3.4*  --  4.2  CL  99  --  100  CO2 27  --  26  GLUCOSE 118*  --  103*  BUN 38*  --  32*  CREATININE 1.65*  --  1.49*  CALCIUM 9.0  --  8.6*  PROT  --  7.6  --   ALBUMIN  --  3.7  --   AST  --  33  --   ALT  --  23  --   ALKPHOS  --  56  --   BILITOT  --  0.6  --   GFRNONAA 30*  --  33*  ANIONGAP 11  --  8    Lipids No results for input(s): CHOL, TRIG, HDL, LABVLDL, LDLCALC, CHOLHDL in the last 168 hours.  Hematology Recent Labs  Lab 04/05/21 1938 04/07/21 0033  WBC 9.6 7.5  RBC 3.88 3.69*  HGB 10.2* 9.7*  HCT 34.8* 33.0*  MCV 89.7 89.4  MCH 26.3 26.3  MCHC 29.3* 29.4*  RDW 18.7* 18.6*  PLT 127* 104*   Thyroid No results for input(s): TSH, FREET4 in the last 168 hours.  BNP Recent Labs  Lab 04/05/21 1939  BNP 528.0*    DDimer No results for input(s): DDIMER in the last 168 hours.   Radiology    DG Chest 2 View  Result Date: 04/05/2021 CLINICAL DATA:  Chest pain. EXAM: CHEST - 2 VIEW COMPARISON:  Chest x-ray 12/05/2020. FINDINGS: The aorta is tortuous in the cardiac silhouette is enlarged, unchanged. There is some strandy opacities in the lung bases favored as atelectasis. There is no pleural effusion or pneumothorax. Cervical spinal fusion plate is partially visualized. IMPRESSION: Stable cardiomegaly. No evidence for pneumonia or edema. Electronically Signed   By: Ronney Asters M.D.   On: 04/05/2021 19:54   CT Angio Chest/Abd/Pel for Dissection W and/or Wo Contrast  Result Date: 04/06/2021 CLINICAL DATA:  87 year old female with pain. EXAM: CT ANGIOGRAPHY CHEST, ABDOMEN AND PELVIS TECHNIQUE: Non-contrast CT of the chest was initially obtained. Multidetector CT imaging through the chest, abdomen and pelvis was performed using the standard protocol during bolus administration of intravenous contrast. Multiplanar reconstructed images and MIPs were obtained and reviewed to evaluate the vascular anatomy. CONTRAST:  90mL OMNIPAQUE IOHEXOL 350 MG/ML SOLN COMPARISON:  CTA chest 11/10/2020. CT  Abdomen and Pelvis 07/21/2019 and earlier. FINDINGS: CTA CHEST FINDINGS Cardiovascular: Chronic aortic calcified atherosclerosis. Chronic coronary artery calcified atherosclerosis. Mildly tortuous thoracic aorta but negative for aneurysm or dissection. Proximal great vessels appear patent with tortuosity. Contrast in the central pulmonary arteries which appear patent. Stable cardiomegaly. No pericardial effusion. Mediastinum/Nodes: Stable moderate gastric hiatal hernia. No mediastinal mass or lymphadenopathy. Lungs/Pleura: Major airways are patent. Mildly lower lung volumes compared to April, and there is enhancing bilateral lower lobe atelectasis superimposed on new small layering pleural effusions. Chronic scarring in the right middle lobe is stable. Mild mosaic attenuation elsewhere in the lungs is chronic and could reflect small vessel or small airway disease. No consolidation or other acute pulmonary opacity. Musculoskeletal: Prior cervical ACDF. Congenital incomplete segmentation of T12-L1 with underlying scoliosis. Stable thoracic spine degeneration including at the adjacent T11-T12 segment with vacuum disc. Chronically advanced degeneration at both shoulders. No acute osseous abnormality identified. Review of the MIP images confirms the above findings. CTA ABDOMEN AND PELVIS FINDINGS VASCULAR Aortoiliac calcified atherosclerosis. Mildly tortuous aorta and iliac arteries are stable since last year with no dissection or aneurysm. Major abdominal aortic branches remain patent. Proximal femoral arteries remain patent with calcified atherosclerosis. Review of the MIP images confirms the above findings. NON-VASCULAR Hepatobiliary: Chronically absent gallbladder.  Stable liver. Pancreas: Negative. Spleen: Negative. Adrenals/Urinary Tract: Stable, negative. Chronic pelvic phleboliths. Stomach/Bowel: Chronic sigmoid colon anastomosis with no adverse features. Nondilated upstream large bowel, retained stool similar to  the CT last year. Chronic postoperative changes also at the cecum with neo terminal ileum. No dilated small bowel. Chronic gastric hernia. Intra-abdominal stomach remarkable for mild inflammatory stranding along the distal greater curve seen on series 6, image 178 and coronal series 9, image 47. This is proximal to the antrum. The duodenum is decompressed and within normal limits. And no other gastro duodenal inflammation is identified. No free air. No free fluid. Chronic postoperative changes to the ventral abdominal wall. And chronic fat containing inguinal hernias, where as that on the right contains a small volume of complex fluid now on series 6, image 262. But no other complicating features. Intra-abdominal Lymphatic: No lymphadenopathy. Reproductive: Surgically absent uterus. Diminutive or absent ovaries. Other: No pelvic free fluid. Musculoskeletal: Chronic versus congenital lumbar interbody ankylosis, except at L4-L5 and L5-S1 where advanced disc and facet degeneration with vacuum phenomena appears stable. No  acute osseous abnormality identified. Review of the MIP images confirms the above findings. IMPRESSION: 1. Negative for aortic dissection or aneurysm. Aortic Atherosclerosis (ICD10-I70.0). 2. Mild inflammatory stranding along the distal greater curve of the stomach is new since last year and suspicious for acute peptic ulcer disease. No complicating features. 3. New small layering pleural effusions with bilateral lower lobe atelectasis. 4. Otherwise stable CT appearance of the chest, abdomen, and pelvis. Including: Chronic cardiomegaly. Chronic hiatal hernia. Previous partial colectomy and neo-terminal ileum. Electronically Signed   By: Genevie Ann M.D.   On: 04/06/2021 04:40    Cardiac Studies   Patient Profile     [name] is a 87 y.o. female with a hx of CAD s/p BMS x 2 to RCA in 6168, chronic diastolic heart failure, COPD, permanent Afib on chronic anticoagulation, hx of DVT and TIA, CKD  stage IIIb, prior colon cancer, and chronic anemia,  who is being seen 04/06/2021 for the evaluation of epigastric pain and chest pain at the request of Dr. Dyann Kief.  Assessment & Plan    Chest pain - hx of CAD s/p BMS x 2 to RCA in 2009 - presenting symptoms not typical for angina with epigastric pain and nausea/vomiting, trops neg x2 without acute ischemic EKG changes - agree with Dr Debara Pickett no plans for ischemic testing this admission - CTA no acute aortic pathology - from GI note based on EGD suspect symptomatic hiatal hernia  2. Acute on chronic diastolic HF - BNP 372 on admissoin. Prior values 350s-490s - CXR no acute process. CTA did show pleural effusions - received IV lasix 40mg  x 1 yesterday, due for 80mg  bid today. I/Os are incomplete. Cr trending down with diuresis consistent with venous congesiton and CHF.   - remains fluid overloaded, continue IV lasix.   For questions or updates, please contact [place] Please consult [email or web address] for contact info under        Signed, Carlyle Dolly, MD  04/07/2021, 12:19 PM

## 2021-04-07 NOTE — Plan of Care (Signed)
  Problem: Education: Goal: Knowledge of General Education information will improve Description: Including pain rating scale, medication(s)/side effects and non-pharmacologic comfort measures Outcome: Progressing   Problem: Health Behavior/Discharge Planning: Goal: Ability to manage health-related needs will improve Outcome: Progressing   Problem: Clinical Measurements: Goal: Ability to maintain clinical measurements within normal limits will improve Outcome: Progressing   Problem: Clinical Measurements: Goal: Respiratory complications will improve Outcome: Not Progressing  Pt is on 2L of supplemental O2. She is room air at baseline. SpO2 drops to high 80s when O2 is removed.

## 2021-04-07 NOTE — Consult Note (Signed)
Consultation Note Date: 04/07/2021   Patient Name: [name]  DOB: 24-Apr-1930  MRN: [id number]  Age / Sex: 87 y.o., female  PCP: [name], NP Referring Physician: Barb Merino, MD  Reason for Consultation: Establishing goals of care  HPI/Patient Profile: 87 y.o. female  with past medical history of nonbleeding superficial small gastric ulcers, colon polyps, HTN, gout, anxiety, depression, a fib on eliquis, CKD, and CHF admitted on 04/05/2021 with chest pain and shortness of breath. Troponin and EKG negative. Upon further investigation symptoms seemed r/t upper GI. Endoscopy completed and revealed hiatal hernia and esophageal dysmotility. Per GI, chest wall pain and abd pain r/t vomiting. Patient on PPI and sucralfate. Patient also with acute on chronic HF - continues on IV lasix. PMT consulted to discuss [place].   Clinical Assessment and Goals of Care: I have reviewed medical records including EPIC notes, labs and imaging, received report from RN, assessed the patient and then met with patient  to discuss diagnosis prognosis, GOC, EOL wishes, disposition and options.  I introduced Palliative Medicine as specialized medical care for people living with serious illness. It focuses on providing relief from the symptoms and stress of a serious illness. The goal is to improve quality of life for both the patient and the family.  We discussed a brief life review of the patient. Patient tells me she has 2 children - her daughter lives nearby, son lives in [place]. She shares about her previous marriages and raising her kids. Tells me how important her relationship with her children is to her. She is very close to her daughter. She tells me she has lived alone for ~20 years - since the death of her second husband.   As far as functional and nutritional status, patient tells me she was doing well prior to hospitalizations - she cared for herself independently.  Did her own laundry. Vacuumed. She would use a walker to assist with ambulation. She tells me he has not had a good appetite since surgery for colon cancer 5-6 years ago. She tells me she is grateful she has always had a great memory and continues to have great memory.    We discussed patient's current illness and what it means in the larger context of patient's on-going co-morbidities. Discussed her gastritis and hiatal hernia. Discussed her ongoing symptoms of upper abdominal pain.   Discussed her symptom management. She tells me the pain medications are helpful - tells me the dilaudid works well and quickly but does not last long. She tells me she is hesitant to use pain medications. We discuss careful balance of obtaining relief of symptoms but not oversedating.   I attempted to elicit values and goals of care important to the patient.    The patient tells me what is most important to her is being at home. She is hopeful to regain some independence. She has already declined transfer to SNF. She would like to return home with extra support from daughter.  Discussed  her code status, patient tells me when it is "her time" she wants to die gracefully. We discussed full code interventions vs DNR. Recommended DNR to her and she agrees.   Patient agrees for me to call her daughter. Discussed above with daughter. She ws tearful. She shares about difficulty of uncertainty of situation. She is hopeful for some relief in patient's symptoms. She tells me patient has been in pain all day. We discussed time for outcomes - continuing current measures for now  and f/u on Monday. She agrees. Discussed change in code status to DNR, she agrees.   Discussed with patient/family the importance of continued conversation with family and the medical providers regarding overall plan of care and treatment options, ensuring decisions are within the context of the patient's values and GOCs.    Questions and concerns were  addressed. The family was encouraged to call with questions or concerns.   Primary Decision Maker PATIENT    SUMMARY OF RECOMMENDATIONS   - code status change to DNR - increase frequency of dilaudid to q2hr PRN as symptoms uncontrolled - most important goal to patient is to return home - no interested in SNF - continue current measures for now, will request my colleague follow up Monday 9/26  Code Status/Advance Care Planning: DNR     Primary Diagnoses: Present on Admission: . Epigastric pain   I have reviewed the medical record, interviewed the patient and family, and examined the patient. The following aspects are pertinent.  Past Medical History:  Diagnosis Date  . Adenocarcinoma, colon [facility] [place]) Oncologist-- Dr Truitt Merle   Multifocal (2) Colon cancer @ ileocecal valve and ascending ( mT4N1Mx), Stage IIIB, grade I, MMR normal , 2 of 16 +lymph nodes, negative surgical margins---  06-02-2014  Right hemicolectomy w/ colostomy  . Allergy   . Anxiety   . Asthma    inhaler "sometimes"  . CAD (coronary artery disease)    a. LHC 4/09: pLAD 40, mDx 70-75, pLCx 30, mLCx 50, mRCA 90, EF 60% >> PCI: BMS x2 to RCA;  b. Myoview 8/12: normal  . Cataract    bil cateracts removed  . Chronic anemia   . Chronic diastolic CHF (congestive heart failure) (HCC)    a. Echo 912 - Mild LVH, EF 55-60%, no RWMA, Gr 1 DD, mild MR, mild LAE, mild RAE, PASP 59 mmHg (mod to severe pulmo HTN)  . Clotting disorder (HCC)    hx of dvt, tia on eliquis  . Colostomy in place [facility])    s/p colostomy takedown 5/16  . Colovaginal fistula    s/p colostomy >> colostomy takedown 5/16  . COPD with chronic bronchitis ([place])   . Depression   . Dysrhythmia    A fib  . Essential hypertension   . GERD (gastroesophageal reflux disease)   . History of adenomatous polyp of colon   . History of blood transfusion   . History of DVT of lower extremity   . History of hiatal hernia   . History of TIA (transient ischemic  attack)    a. Head CT 6/15: small chronic lacunar infarct in thalamus  . Hyperlipidemia   . OA (osteoarthritis)   . Osteoporosis   . Peripheral neuropathy   . Pneumonia   . Pulmonary HTN ([place])    a. PASP on Echo in 9/12:  59 mmHg  . Renal insufficiency   . Sigmoid diverticulosis    s/p sigmoid colectomy  . Stroke ([place])    TIAs  . Wears dentures   . Wears glasses    Social History   Socioeconomic History  . Marital status: Widowed    Spouse name: Not on file  . Number of children: Not on file  . Years of education: Not on file  . Highest education level: Not on file  Occupational History  . Not on file  Tobacco Use  . Smoking status: Former    Packs/day: 0.50    Years: 16.00    Pack years: 8.00  Types: Cigarettes    Start date: 66    Quit date: 07/16/1967    Years since quitting: 53.7  . Smokeless tobacco: Never  Vaping Use  . Vaping Use: Never used  Substance and Sexual Activity  . Alcohol use: No    Alcohol/week: 0.0 standard drinks  . Drug use: No  . Sexual activity: Not on file  Other Topics Concern  . Not on file  Social History Narrative   Married, 2 children.  As of November 2015 her husband has been living in a nursing home for tendon after years as he suffers from multiple medical problems.   Patient does not drink alcohol nor does she smoke or chew tobacco products   Right handed   8th grade   1 cup daily   Social Determinants of Health   Financial Resource Strain: Low Risk   . Difficulty of Paying Living Expenses: Not hard at all  Food Insecurity: No Food Insecurity  . Worried About Charity fundraiser in the Last Year: Never true  . Ran Out of Food in the Last Year: Never true  Transportation Needs: No Transportation Needs  . Lack of Transportation (Medical): No  . Lack of Transportation (Non-Medical): No  Physical Activity: Inactive  . Days of Exercise per Week: 0 days  . Minutes of Exercise per Session: 0 min  Stress: No Stress Concern  Present  . Feeling of Stress : Not at all  Social Connections: Moderately Isolated  . Frequency of Communication with Friends and Family: More than three times a week  . Frequency of Social Gatherings with Friends and Family: Once a week  . Attends Religious Services: 1 to 4 times per year  . Active Member of Clubs or Organizations: No  . Attends Archivist Meetings: Never  . Marital Status: Widowed   Family History  Problem Relation Age of Onset  . Osteoarthritis Mother   . Heart failure Mother   . Colon cancer Maternal Uncle 34  . Heart Problems Father   . Cancer Sister 85       ? colon cancer   . Kidney cancer Brother 22  . Esophageal cancer Neg Hx   . Rectal cancer Neg Hx   . Stomach cancer Neg Hx   . Pancreatic cancer Neg Hx    Scheduled Meds: . allopurinol  100 mg Oral Daily  . [START ON 04/08/2021] apixaban  5 mg Oral BID  . diclofenac Sodium  4 g Topical QID  . famotidine  20 mg Oral QHS  . furosemide  80 mg Intravenous BID  . gabapentin  600 mg Oral BID  . LORazepam  0.5 mg Oral QHS  . metoprolol tartrate  75 mg Oral BID  . pantoprazole  40 mg Oral BID AC  . sucralfate  1 g Oral TID WC & HS  . traZODone  100 mg Oral QHS  . umeclidinium-vilanterol  1 puff Inhalation Daily   Continuous Infusions: PRN Meds:.acetaminophen, HYDROmorphone (DILAUDID) injection, oxyCODONE, polyethylene glycol No Known Allergies Review of Systems  Constitutional:  Positive for activity change, appetite change and fatigue.  Respiratory:  Negative for shortness of breath.   Gastrointestinal:  Positive for abdominal pain.  Neurological:  Positive for weakness.   Physical Exam Constitutional:      Comments: Appears uncomfortable  Pulmonary:     Effort: Pulmonary effort is normal.  Abdominal:     Palpations: Abdomen is soft.  Skin:    General: Skin is  warm and dry.  Neurological:     Mental Status: She is alert and oriented to person, place, and time.  Psychiatric:         Mood and Affect: Mood normal.        Behavior: Behavior normal.    Vital Signs: BP 112/74 (BP Location: Left Arm)   Pulse (!) 108   Temp 98.1 F (36.7 C) (Oral)   Resp 19   Ht $R'5\' 4"'LZ$  (1.626 m)   Wt 91.2 kg   SpO2 94%   BMI 34.50 kg/m  Pain Scale: 0-10   Pain Score: 7    SpO2: SpO2: 94 % O2 Device:SpO2: 94 % O2 Flow Rate: .O2 Flow Rate (L/min): 2 L/min  IO: Intake/output summary:  Intake/Output Summary (Last 24 hours) at 04/07/2021 1619 Last data filed at 04/07/2021 1541 Gross per 24 hour  Intake 120 ml  Output 600 ml  Net -480 ml    LBM: Last BM Date: 04/05/21 Baseline Weight: Weight: 84.8 kg Most recent weight: Weight: 91.2 kg     Palliative Assessment/Data: PPS 50%    Time Total: 80 minutes Greater than 50%  of this time was spent counseling and coordinating care related to the above assessment and plan.  Juel Burrow, DNP, AGNP-C Palliative Medicine Team [phone] Pager: [phone]

## 2021-04-07 NOTE — Progress Notes (Signed)
   Patient Name: [name] Credit Date of Encounter: 04/07/2021, 8:37 AM    Subjective  I still hurt she tells me Spit on saliva a couple of times but no vomiting   Objective  BP 115/62 (BP Location: Left Arm)   Pulse (!) 104   Temp 98.4 F (36.9 C) (Oral)   Resp 18   Ht 5\' 4"  (1.626 m)   Wt 91.2 kg   SpO2 97%   BMI 34.50 kg/m  Chest wall and upper abd very tender  CBC Latest Ref Rng & Units 04/07/2021 04/05/2021 01/11/2021  WBC 4.0 - 10.5 K/uL 7.5 9.6 6.7  Hemoglobin 12.0 - 15.0 g/dL 9.7(L) 10.2(L) 10.4(L)  Hematocrit 36.0 - 46.0 % 33.0(L) 34.8(L) 34.5  Platelets 150 - 400 K/uL 104(L) 127(L) 144(L)   Recent Labs  Lab 04/05/21 1938 04/07/21 0033  NA 137 134*  K 3.4* 4.2  CL 99 100  CO2 27 26  GLUCOSE 118* 103*  BUN 38* 32*  CREATININE 1.65* 1.49*  CALCIUM 9.0 8.6*      Assessment and Plan  Symptomatic hiatal hernia (I think)? Some esophageal dysmotility Chest wall pain and abd wall pain from vomiting  Diclofenac gel to chest wall Continue PPI and sucralfate Consider PT help with abd wall and chest wall pain  ? Inpatient vs outpatient UGI series though not certain that will change management for her so hold off  Will follow  Gatha Mayer, MD, [facility] 04/07/2021 8:37 AM

## 2021-04-07 NOTE — Progress Notes (Signed)
PROGRESS NOTE    [name] Tissue  CXK:[id number] DOB: 04/12/1930 DOA: 04/05/2021 PCP: [name], NP    Brief Narrative:  87 year old female who is very debilitated, has history of nonbleeding superficial small gastric ulcers, colon polyps, hypertension, gout, chronic anxiety and depression, chronic A. fib on Eliquis presented to emergency room with chest pain and shortness of breath.  In the emergency room she was found to have significant upper GI symptoms, started on Protonix and admitted.  Also consulted gastroenterology as well as cardiology.   Assessment & Plan:   Active Problems:   Acute on chronic diastolic heart failure (HCC)   Large hiatal hernia   Epigastric pain   Gastritis and gastroduodenitis  Dyspepsia/ gastritis and gastroduodenitis/symptomatic large hiatal hernia: Still with significant pain.  This is mostly related to large hiatal hernia, peptic ulcer disease as well as pain exacerbated with nausea and vomiting. Upper GI endoscopy 9/23 with tortuous esophagus, gastritis, localized mild inflammation with erythema and granularity on the greater curvature of the stomach consistent with gastric duodenitis. Symptomatic treatment, continue Protonix, Carafate, additional Pepcid at night. Symptom control with oral and IV opiates.  Advance diet as tolerated. This [place] be ongoing symptoms, she will benefit with palliative care discussion.  Will consult palliative care.  Chest pain, acute coronary syndrome ruled out.  Acute on chronic diastolic congestive heart failure: Reported with increasing shortness of breath and leg swelling.  Seen by cardiology. Started on IV diuresis with good response.  Continue IV Lasix today.  Potassium supplementation.  Recheck levels tomorrow including phosphorus and magnesium.  CKD stage IIIb: At about baseline.  Continue monitoring on diuretics.  Paroxysmal A. fib with history of stroke: Rate controlled on metoprolol.  Therapeutic on Eliquis.   Resume tomorrow morning.  GI cleared for anticoagulation.  Hypokalemia: Replaced and adequate.  Recheck tomorrow morning.  Goal of care discussion: Met with patient's daughter and granddaughter at the bedside.  Also discussed in detail with patient about some of the untreatable conditions and trying to achieve good symptom control. Family eager to talk to palliative care and hospice to help her understand her disease process, also help her with ongoing palliative discussion and pain management.    DVT prophylaxis: Place and maintain sequential compression device Start: 04/06/21 1758   Code Status: Full code Family Communication: Daughter and granddaughter at the bedside Disposition Plan: Status is: Inpatient  Remains inpatient appropriate because:Inpatient level of care appropriate due to severity of illness  Dispo:  Patient From: Home  Planned Disposition: Home  Medically stable for discharge: No           Consultants:  Cardiology Gastroenterology  Procedures:  Upper GI endoscopy  Antimicrobials:  None   Subjective: Patient seen and examined in the morning rounds.  I went to examine her again in the afternoon with family at the bedside.  Patient reported ongoing episodic epigastric pain, retrosternal chest pain and transient relief with IV pain medications.  Just feels sick.  No shortness of breath at rest, however she cannot lay back flat.  Leg swelling has slightly improved as per her.  Anxious.  Objective: Vitals:   04/06/21 2312 04/07/21 0344 04/07/21 0804 04/07/21 1203  BP:  118/76 115/62 112/74  Pulse: 86 90 (!) 104 (!) 108  Resp: 17 (!) _0 Temp:  98.8 F (37.1 C) 98.4 F (36.9 C) 98.1 F (36.7 C)  TempSrc:  Oral Oral Oral  SpO2: 94% 99% 97% 94%  Weight:  91.2  kg    Height:        Intake/Output Summary (Last 24 hours) at 04/07/2021 1604 Last data filed at 04/07/2021 1541 Gross per 24 hour  Intake 120 ml  Output 600 ml  Net -480 ml   Filed  Weights   04/06/21 1304 04/06/21 2000 04/07/21 0344  Weight: 84.8 kg 91.6 kg 91.2 kg    Examination:  General exam: Chronically sick looking.  Frail debilitated and anxious.  Not in any distress. Respiratory system: Mostly clear.  Some basal crackles at posterior base present. Cardiovascular system: S1 & S2 heard, RRR.  1+ bilateral pedal edema.   Gastrointestinal system: No localized tenderness.  Bowel sounds present. Central nervous system: Alert and oriented. No focal neurological deficits. Generalized weakness.    Data Reviewed: I have personally reviewed following labs and imaging studies  CBC: Recent Labs  Lab 04/05/21 1938 04/07/21 0033  WBC 9.6 7.5  HGB 10.2* 9.7*  HCT 34.8* 33.0*  MCV 89.7 89.4  PLT 127* 761*   Basic Metabolic Panel: Recent Labs  Lab 04/05/21 1938 04/07/21 0033  NA 137 134*  K 3.4* 4.2  CL 99 100  CO2 27 26  GLUCOSE 118* 103*  BUN 38* 32*  CREATININE 1.65* 1.49*  CALCIUM 9.0 8.6*   GFR: Estimated Creatinine Clearance: 28 mL/min (A) (by C-G formula based on SCr of 1.49 mg/dL (H)). Liver Function Tests: Recent Labs  Lab 04/05/21 2143  AST 33  ALT 23  ALKPHOS 56  BILITOT 0.6  PROT 7.6  ALBUMIN 3.7   Recent Labs  Lab 04/05/21 2143  LIPASE 45   No results for input(s): AMMONIA in the last 168 hours. Coagulation Profile: No results for input(s): INR, PROTIME in the last 168 hours. Cardiac Enzymes: No results for input(s): CKTOTAL, CKMB, CKMBINDEX, TROPONINI in the last 168 hours. BNP (last 3 results) Recent Labs    01/11/21 1245 01/24/21 1128 02/01/21 1158  PROBNP 3,759* 2,625* 3,607*   HbA1C: No results for input(s): HGBA1C in the last 72 hours. CBG: No results for input(s): GLUCAP in the last 168 hours. Lipid Profile: No results for input(s): CHOL, HDL, LDLCALC, TRIG, CHOLHDL, LDLDIRECT in the last 72 hours. Thyroid Function Tests: No results for input(s): TSH, T4TOTAL, FREET4, T3FREE, THYROIDAB in the last 72  hours. Anemia Panel: No results for input(s): VITAMINB12, FOLATE, FERRITIN, TIBC, IRON, RETICCTPCT in the last 72 hours. Sepsis Labs: No results for input(s): PROCALCITON, LATICACIDVEN in the last 168 hours.  Recent Results (from the past 240 hour(s))  Resp Panel by RT-PCR (Flu A&B, Covid) Nasopharyngeal Swab     Status: None   Collection Time: 04/06/21  3:54 AM   Specimen: Nasopharyngeal Swab; Nasopharyngeal(NP) swabs in vial transport medium  Result Value Ref Range Status   SARS Coronavirus 2 by RT PCR NEGATIVE NEGATIVE Final    Comment: (NOTE) SARS-CoV-2 target nucleic acids are NOT DETECTED.  The SARS-CoV-2 RNA is generally detectable in upper respiratory specimens during the acute phase of infection. The lowest concentration of SARS-CoV-2 viral copies this assay can detect is 138 copies/mL. A negative result does not preclude SARS-Cov-2 infection and should not be used as the sole basis for treatment or other patient management decisions. A negative result [place] occur with  improper specimen collection/handling, submission of specimen other than nasopharyngeal swab, presence of viral mutation(s) within the areas targeted by this assay, and inadequate number of viral copies(<138 copies/mL). A negative result must be combined with clinical observations, patient history, and epidemiological  information. The expected result is Negative.  Fact Sheet for Patients:  EntrepreneurPulse.com.au  Fact Sheet for Healthcare Providers:  IncredibleEmployment.be  This test is no t yet approved or cleared by the [place] FDA and  has been authorized for detection and/or diagnosis of SARS-CoV-2 by FDA under an Emergency Use Authorization (EUA). This EUA will remain  in effect (meaning this test can be used) for the duration of the COVID-19 declaration under Section 564(b)(1) of the Act, 21 U.S.C.section 360bbb-3(b)(1), unless the authorization is  terminated  or revoked sooner.       Influenza A by PCR NEGATIVE NEGATIVE Final   Influenza B by PCR NEGATIVE NEGATIVE Final    Comment: (NOTE) The Xpert Xpress SARS-CoV-2/FLU/RSV plus assay is intended as an aid in the diagnosis of influenza from Nasopharyngeal swab specimens and should not be used as a sole basis for treatment. Nasal washings and aspirates are unacceptable for Xpert Xpress SARS-CoV-2/FLU/RSV testing.  Fact Sheet for Patients: EntrepreneurPulse.com.au  Fact Sheet for Healthcare Providers: IncredibleEmployment.be  This test is not yet approved or cleared by the [place] FDA and has been authorized for detection and/or diagnosis of SARS-CoV-2 by FDA under an Emergency Use Authorization (EUA). This EUA will remain in effect (meaning this test can be used) for the duration of the COVID-19 declaration under Section 564(b)(1) of the Act, 21 U.S.C. section 360bbb-3(b)(1), unless the authorization is terminated or revoked.  Performed at [facility], [place] [address] [place], [place] [address]          Radiology Studies: DG Chest 2 View  Result Date: 04/05/2021 CLINICAL DATA:  Chest pain. EXAM: CHEST - 2 VIEW COMPARISON:  Chest x-ray 12/05/2020. FINDINGS: The aorta is tortuous in the cardiac silhouette is enlarged, unchanged. There is some strandy opacities in the lung bases favored as atelectasis. There is no pleural effusion or pneumothorax. Cervical spinal fusion plate is partially visualized. IMPRESSION: Stable cardiomegaly. No evidence for pneumonia or edema. Electronically Signed   By: Ronney Asters M.D.   On: 04/05/2021 19:54   CT Angio Chest/Abd/Pel for Dissection W and/or Wo Contrast  Result Date: 04/06/2021 CLINICAL DATA:  88 year old female with pain. EXAM: CT ANGIOGRAPHY CHEST, ABDOMEN AND PELVIS TECHNIQUE: Non-contrast CT of the chest was initially obtained. Multidetector CT imaging through the chest,  abdomen and pelvis was performed using the standard protocol during bolus administration of intravenous contrast. Multiplanar reconstructed images and MIPs were obtained and reviewed to evaluate the vascular anatomy. CONTRAST:  35m OMNIPAQUE IOHEXOL 350 MG/ML SOLN COMPARISON:  CTA chest 11/10/2020. CT Abdomen and Pelvis 07/21/2019 and earlier. FINDINGS: CTA CHEST FINDINGS Cardiovascular: Chronic aortic calcified atherosclerosis. Chronic coronary artery calcified atherosclerosis. Mildly tortuous thoracic aorta but negative for aneurysm or dissection. Proximal great vessels appear patent with tortuosity. Contrast in the central pulmonary arteries which appear patent. Stable cardiomegaly. No pericardial effusion. Mediastinum/Nodes: Stable moderate gastric hiatal hernia. No mediastinal mass or lymphadenopathy. Lungs/Pleura: Major airways are patent. Mildly lower lung volumes compared to April, and there is enhancing bilateral lower lobe atelectasis superimposed on new small layering pleural effusions. Chronic scarring in the right middle lobe is stable. Mild mosaic attenuation elsewhere in the lungs is chronic and could reflect small vessel or small airway disease. No consolidation or other acute pulmonary opacity. Musculoskeletal: Prior cervical ACDF. Congenital incomplete segmentation of T12-L1 with underlying scoliosis. Stable thoracic spine degeneration including at the adjacent T11-T12 segment with vacuum disc. Chronically advanced degeneration at both shoulders. No acute osseous abnormality identified.  Review of the MIP images confirms the above findings. CTA ABDOMEN AND PELVIS FINDINGS VASCULAR Aortoiliac calcified atherosclerosis. Mildly tortuous aorta and iliac arteries are stable since last year with no dissection or aneurysm. Major abdominal aortic branches remain patent. Proximal femoral arteries remain patent with calcified atherosclerosis. Review of the MIP images confirms the above findings. NON-VASCULAR  Hepatobiliary: Chronically absent gallbladder.  Stable liver. Pancreas: Negative. Spleen: Negative. Adrenals/Urinary Tract: Stable, negative. Chronic pelvic phleboliths. Stomach/Bowel: Chronic sigmoid colon anastomosis with no adverse features. Nondilated upstream large bowel, retained stool similar to the CT last year. Chronic postoperative changes also at the cecum with neo terminal ileum. No dilated small bowel. Chronic gastric hernia. Intra-abdominal stomach remarkable for mild inflammatory stranding along the distal greater curve seen on series 6, image 178 and coronal series 9, image 47. This is proximal to the antrum. The duodenum is decompressed and within normal limits. And no other gastro duodenal inflammation is identified. No free air. No free fluid. Chronic postoperative changes to the ventral abdominal wall. And chronic fat containing inguinal hernias, where as that on the right contains a small volume of complex fluid now on series 6, image 262. But no other complicating features. Intra-abdominal Lymphatic: No lymphadenopathy. Reproductive: Surgically absent uterus. Diminutive or absent ovaries. Other: No pelvic free fluid. Musculoskeletal: Chronic versus congenital lumbar interbody ankylosis, except at L4-L5 and L5-S1 where advanced disc and facet degeneration with vacuum phenomena appears stable. No acute osseous abnormality identified. Review of the MIP images confirms the above findings. IMPRESSION: 1. Negative for aortic dissection or aneurysm. Aortic Atherosclerosis (ICD10-I70.0). 2. Mild inflammatory stranding along the distal greater curve of the stomach is new since last year and suspicious for acute peptic ulcer disease. No complicating features. 3. New small layering pleural effusions with bilateral lower lobe atelectasis. 4. Otherwise stable CT appearance of the chest, abdomen, and pelvis. Including: Chronic cardiomegaly. Chronic hiatal hernia. Previous partial colectomy and neo-terminal  ileum. Electronically Signed   By: Genevie Ann M.D.   On: 04/06/2021 04:40        Scheduled Meds:  allopurinol  100 mg Oral Daily   diclofenac Sodium  4 g Topical QID   famotidine  20 mg Oral QHS   furosemide  80 mg Intravenous BID   gabapentin  600 mg Oral BID   LORazepam  0.5 mg Oral QHS   metoprolol tartrate  75 mg Oral BID   pantoprazole  40 mg Oral BID AC   sucralfate  1 g Oral TID WC & HS   traZODone  100 mg Oral QHS   umeclidinium-vilanterol  1 puff Inhalation Daily   Continuous Infusions:   LOS: 1 day    Time spent: 30 minutes    Barb Merino, MD [facility] Pager [phone]

## 2021-04-07 NOTE — Progress Notes (Signed)
Patient has not had much urine output today. She used the [facility] commode once with PT and that was unmeasured. Other than that she has only had 264ml of output all day.

## 2021-04-07 NOTE — Progress Notes (Signed)
OT Cancellation Note  Patient Details Name: [name] MRN: [id number] DOB: 1930/03/25   Cancelled Treatment:    Reason Eval/Treat Not Completed: Pain limiting ability to participate. Pt with 8/10 epigastric pain at rest. RN in the room giving pain med. Plan to reattempt at a later time/date.  Tyrone Schimke, OT Acute Rehabilitation Services Pager: [phone] Office: [phone]  04/07/2021, 11:56 AM

## 2021-04-07 NOTE — Evaluation (Signed)
Physical Therapy Evaluation Patient Details Name: [name] MRN: [id number] DOB: March 06, 1930 Today's Date: 04/07/2021  History of Present Illness  Pt is a 87 y.o. F who presents with complaints of chest pain and shortness of breath. Upper GI showing hiatal hernia and "examined esophagus was mildly tortuous." Significant PMH: HTN, gout, chronic A. fib.  Clinical Impression  Prior to admission, pt lives alone, is independent with ADL's and uses a Rollator for mobility. Pt presents with decreased functional mobility secondary to generalized weakness, balance deficits, decreased endurance. Pt requiring min assist for stand pivot transfers; unable to ambulate more than taking pivotal steps due to fatigue/pain. Desaturation to 87% on RA; required 4-6L to maintain > 90% during mobility. Irregular HR 117-141 bpm, BP 122/92. Will currently benefit from post acute rehab to address deficits, maximize functional mobility and decrease caregiver burden. Discussed with pt/pt daughter.        Recommendations for follow up therapy are one component of a multi-disciplinary discharge planning process, led by the attending physician.  Recommendations Berber be updated based on patient status, additional functional criteria and insurance authorization.  Follow Up Recommendations SNF (pending progress and caregiver verifying support, Jantz can go home with HHPT)    Equipment Recommendations  None recommended by PT (pt well equipped)    Recommendations for Other Services       Precautions / Restrictions Precautions Precautions: Fall;Other (comment) Precaution Comments: watch O2, HR Restrictions Weight Bearing Restrictions: No      Mobility  Bed Mobility Overal bed mobility: Needs Assistance Bed Mobility: Supine to Sit     Supine to sit: Supervision     General bed mobility comments: supervision for safety, increased time/effort    Transfers Overall transfer level: Needs assistance Equipment used:  Rolling walker (2 wheeled) Transfers: Sit to/from [facility] Sit to Stand: Min assist Stand pivot transfers: Min assist       General transfer comment: Light minA to rise from edge of bed x 2, pivot to [facility] and then to recliner.  Ambulation/Gait                Stairs            Wheelchair Mobility    Modified Rankin (Stroke Patients Only)       Balance Overall balance assessment: Needs assistance Sitting-balance support: Feet unsupported Sitting balance-Leahy Scale: Fair     Standing balance support: Bilateral upper extremity supported Standing balance-Leahy Scale: Poor Standing balance comment: reliant on RW                             Pertinent Vitals/Pain Pain Assessment: Faces Faces Pain Scale: Hurts even more Pain Location: epigastric Pain Descriptors / Indicators: Discomfort;Constant;Grimacing Pain Intervention(s): Monitored during session;Limited activity within patient's tolerance    Home Living Family/patient expects to be discharged to:: Private residence Living Arrangements: Alone Available Help at Discharge: Family;Available PRN/intermittently (daughter works from home) Type of Home: House Home Access: [place]: Two level;Able to live on main level with bedroom/bathroom Home Equipment: Gilford Rile - 2 wheels;Walker - 4 wheels;Bedside commode;Shower seat - built in;Grab bars - tub/shower;Hand held shower head      Prior Function Level of Independence: Independent with assistive device(s)         Comments: Uses Rollator, does not drive; daughter provides transportation     Hand Dominance   Dominant Hand: Right    Extremity/Trunk  Assessment   Upper Extremity Assessment Upper Extremity Assessment: Defer to OT evaluation    Lower Extremity Assessment Lower Extremity Assessment: Generalized weakness    Cervical / Trunk Assessment Cervical / Trunk Assessment: Kyphotic   Communication   Communication: No difficulties  Cognition Arousal/Alertness: Awake/alert Behavior During Therapy: WFL for tasks assessed/performed Overall Cognitive Status: Within Functional Limits for tasks assessed                                        General Comments      Exercises     Assessment/Plan    PT Assessment Patient needs continued PT services  PT Problem List Decreased strength;Decreased activity tolerance;Decreased balance;Decreased mobility;Pain       PT Treatment Interventions DME instruction;Gait training;Functional mobility training;Therapeutic activities;Therapeutic exercise;Balance training;Patient/family education    PT Goals (Current goals can be found in the Care Plan section)  Acute Rehab PT Goals Patient Stated Goal: regain strength PT Goal Formulation: With patient/family Time For Goal Achievement: 04/21/21 Potential to Achieve Goals: Good    Frequency Min 3X/week   Barriers to discharge        Co-evaluation               AM-PAC PT "6 Clicks" Mobility  Outcome Measure Help needed turning from your back to your side while in a flat bed without using bedrails?: None Help needed moving from lying on your back to sitting on the side of a flat bed without using bedrails?: A Little Help needed moving to and from a bed to a chair (including a wheelchair)?: A Little Help needed standing up from a chair using your arms (e.g., wheelchair or bedside chair)?: A Little Help needed to walk in hospital room?: A Little Help needed climbing 3-5 steps with a railing? : A Lot 6 Click Score: 18    End of Session Equipment Utilized During Treatment: Gait belt;Oxygen Activity Tolerance: Patient tolerated treatment well Patient left: in chair;with call bell/phone within reach;with chair alarm set;with family/visitor present Nurse Communication: Mobility status PT Visit Diagnosis: Unsteadiness on feet (R26.81);Muscle weakness  (generalized) (M62.81);Difficulty in walking, not elsewhere classified (R26.2)    Time: 4888-9169 PT Time Calculation (min) (ACUTE ONLY): 33 min   Charges:   PT Evaluation $PT Eval Moderate Complexity: 1 Mod PT Treatments $Therapeutic Activity: 8-22 mins        Wyona Almas, PT, DPT Acute Rehabilitation Services Pager [phone] Office [phone]   Deno Etienne 04/07/2021, 11:01 AM

## 2021-04-07 NOTE — TOC Initial Note (Signed)
Transition of Care [facility]) - Initial/Assessment Note    Patient Details  Name: [name] MRN: [id number] Date of Birth: 09/26/1929  Transition of Care [facility]) CM/SW Contact:    Benard Halsted, LCSW Phone Number: 04/07/2021, 11:26 AM  Clinical Narrative:                 CSW received consult for possible SNF at time of discharge. CSW spoke with patient and her daughter at bedside. Patient reported that she lives home alone with her daughter coming in several days of the week to check on her. She stated she does not want to go to SNF and only wants to return home since "there's no place like home". CSW discussed home health services and she is willing to consider them but has not had good experiences in the past. CSW discussed equipment needs with patient and she reported she already has a walker and everything she needs at home. She is not on home oxygen. Daughter confirmed she will increase the time she spends with patient to help support her. CSW confirmed PCP and address with patient. No further questions reported at this time.    Expected Discharge Plan: [place] Barriers to Discharge: Continued Medical Work up   Patient Goals and CMS Choice Patient states their goals for this hospitalization and ongoing recovery are:: return home CMS Medicare.gov Compare Post Acute Care list provided to:: Patient Choice offered to / list presented to : Patient, Adult Children  Expected Discharge Plan and Services Expected Discharge Plan: [place] In-house Referral: Clinical Social Work Discharge Planning Services: CM Consult Post Acute Care Choice: [place] arrangements for the past 2 months: Single Family Home                                      Prior Living Arrangements/Services Living arrangements for the past 2 months: Single Family Home Lives with:: Self Patient language and need for interpreter reviewed:: Yes Do you feel safe going  back to the place where you live?: Yes      Need for Family Participation in Patient Care: Yes (Comment) Care giver support system in place?: Yes (comment) Current home services: DME (walker, handicap bathroom) Criminal Activity/Legal Involvement Pertinent to Current Situation/Hospitalization: No - Comment as needed  Activities of Daily Living   ADL Screening (condition at time of admission) Patient's cognitive ability adequate to safely complete daily activities?: Yes Is the patient deaf or have difficulty hearing?: Yes Does the patient have difficulty seeing, even when wearing glasses/contacts?: No Does the patient have difficulty concentrating, remembering, or making decisions?: No Patient able to express need for assistance with ADLs?: Yes Does the patient have difficulty dressing or bathing?: No Independently performs ADLs?: Yes (appropriate for developmental age) Does the patient have difficulty walking or climbing stairs?: Yes Weakness of Legs: None Weakness of Arms/Hands: None  Permission Sought/Granted Permission sought to share information with : Facility Sport and exercise psychologist, Family Supports Permission granted to share information with : Yes, Verbal Permission Granted  Share Information with NAME: [name]  Permission granted to share info w AGENCY: [place] granted to share info w Relationship: Daughter  Permission granted to share info w Contact Information: [phone]  Emotional Assessment Appearance:: Appears stated age Attitude/Demeanor/Rapport: Engaged Affect (typically observed): Accepting, Appropriate Orientation: : Oriented to Self, Oriented to Place, Oriented to  Time, Oriented to Situation Alcohol / Substance Use: Not Applicable Psych Involvement: No (comment)  Admission diagnosis:  Anasarca [R60.1] Epigastric pain [R10.13] Peptic ulcer disease [K27.9] Pleural effusion [J90] Hypoxia [R09.02] Chest pain, unspecified type [R07.9] Nausea  and vomiting, intractability of vomiting not specified, unspecified vomiting type [R11.2] Patient Active Problem List   Diagnosis Date Noted   Epigastric pain 04/06/2021   Gastritis and gastroduodenitis    CHF exacerbation ([place]) 11/10/2020   Mediastinal adenopathy 01/21/2020   Cellulitis 08/03/2019   CKD (chronic kidney disease), stage III ([place]) 08/03/2019   Pulmonary emphysema ([place]) 04/06/2019   Gastric ulcer    Thrombocytopenia ([place]) 10/27/2017   Abnormal stress test 10/27/2017   Moderate mitral regurgitation 10/27/2017   CAP (community acquired pneumonia) 10/26/2017   Displaced fracture of lateral malleolus of right fibula, subsequent encounter for closed fracture with routine healing 12/27/2016   Iron deficiency anemia 11/12/2016   Chronic anticoagulation 11/12/2016   Chronic atrial fibrillation ([place]) 08/05/2016   Coronary artery disease of native artery of native heart with stable angina pectoris ([place]) 08/05/2016   Failed total left knee replacement ([place]) 08/02/2015   Sigmoid diverticulitis 11/24/2014   Neuropathic pain 06/13/2014   Insomnia 06/13/2014   Anxiety and depression 06/13/2014   Cancer of ascending colon ([place]) 06/13/2014   Colovaginal fistula 04/20/2014   Lethargy 09/15/2013   Chest pain, musculoskeletal 02/19/2011   Cough 03/14/2010   Depression 02/07/2010   Dysuria 11/08/2009   Chronic diastolic CHF (congestive heart failure) ([place]) 02/27/2009   Pure hypercholesterolemia 04/05/2008   Coronary atherosclerosis 04/05/2008   Osteoarthritis 05/19/2007   Essential hypertension 01/21/2007   GERD 01/21/2007   Large hiatal hernia 01/21/2007   Diverticulosis of large intestine 01/21/2007   COLONIC POLYPS, HX OF 01/21/2007   PCP:  [name], NP Pharmacy:   [facility] - [place], [place] - [address] [place] [address] Phone: [phone] Fax: [phone]  [place]  [place] - [address] [place]) [place] [address] Phone: [phone] Fax: [phone]     Social Determinants of Health (SDOH) Interventions    Readmission Risk Interventions No flowsheet data found.

## 2021-04-08 DIAGNOSIS — I5033 Acute on chronic diastolic (congestive) heart failure: Secondary | ICD-10-CM | POA: Diagnosis not present

## 2021-04-08 DIAGNOSIS — R109 Unspecified abdominal pain: Secondary | ICD-10-CM

## 2021-04-08 LAB — BASIC METABOLIC PANEL
Anion gap: 8 (ref 5–15)
BUN: 36 mg/dL — ABNORMAL HIGH (ref 8–23)
CO2: 27 mmol/L (ref 22–32)
Calcium: 8.3 mg/dL — ABNORMAL LOW (ref 8.9–10.3)
Chloride: 98 mmol/L (ref 98–111)
Creatinine, Ser: 1.95 mg/dL — ABNORMAL HIGH (ref 0.44–1.00)
GFR, Estimated: 24 mL/min — ABNORMAL LOW (ref >60)
Glucose, Bld: 97 mg/dL (ref 70–99)
Potassium: 4.3 mmol/L (ref 3.5–5.1)
Sodium: 133 mmol/L — ABNORMAL LOW (ref 135–145)

## 2021-04-08 LAB — MAGNESIUM: Magnesium: 2.2 mg/dL (ref 1.7–2.4)

## 2021-04-08 LAB — PHOSPHORUS: Phosphorus: 5.3 mg/dL — ABNORMAL HIGH (ref 2.5–4.6)

## 2021-04-08 MED ORDER — ENSURE ENLIVE PO LIQD
237.0000 mL | Freq: Once | ORAL | Status: AC
  Administered 2021-04-08: 237 mL via ORAL
  Filled 2021-04-08: qty 237

## 2021-04-08 MED ORDER — PROCHLORPERAZINE EDISYLATE 10 MG/2ML IJ SOLN
5.0000 mg | Freq: Once | INTRAMUSCULAR | Status: AC
  Administered 2021-04-08: 5 mg via INTRAVENOUS
  Filled 2021-04-08: qty 2

## 2021-04-08 MED ORDER — ONDANSETRON HCL 4 MG/2ML IJ SOLN: 4.0000 mg | Freq: Four times a day (QID) | INTRAMUSCULAR | Status: DC | PRN

## 2021-04-08 MED ORDER — ONDANSETRON HCL 4 MG/2ML IJ SOLN
4.0000 mg | Freq: Four times a day (QID) | INTRAMUSCULAR | Status: DC | PRN
  Administered 2021-04-08 – 2021-04-09 (×3): 4 mg via INTRAVENOUS
  Filled 2021-04-08 (×2): qty 2

## 2021-04-08 MED ORDER — ENSURE ENLIVE PO LIQD
237.0000 mL | Freq: Two times a day (BID) | ORAL | Status: DC
  Administered 2021-04-09: 237 mL via ORAL

## 2021-04-08 NOTE — Plan of Care (Signed)
  Problem: Education: Goal: Knowledge of General Education information will improve Description Including pain rating scale, medication(s)/side effects and non-pharmacologic comfort measures Outcome: Progressing   Problem: Health Behavior/Discharge Planning: Goal: Ability to manage health-related needs will improve Outcome: Progressing   

## 2021-04-08 NOTE — Progress Notes (Signed)
PROGRESS NOTE    [name]  XYX:[id number] DOB: 04/21/1930 DOA: 04/05/2021 PCP: Shirline Frees, NP    Brief Narrative:  87 year old female who is very debilitated, has history of nonbleeding superficial small gastric ulcers, colon polyps, hypertension, gout, chronic anxiety and depression, chronic A. fib on Eliquis presented to emergency room with chest pain and shortness of breath.  In the emergency room she was found to have significant upper GI symptoms, started on Protonix and admitted.  Also consulted gastroenterology as well as cardiology.   Assessment & Plan:   Active Problems:   Acute on chronic diastolic heart failure (HCC)   Large hiatal hernia   Musculoskeletal chest pain   Abdominal wall pain   Gastritis and gastroduodenitis  Dyspepsia/ gastritis and gastroduodenitis/symptomatic large hiatal hernia: Still with significant pain.  This is mostly related to large hiatal hernia, peptic ulcer disease as well as pain exacerbated with nausea and vomiting. Upper GI endoscopy 9/23 with tortuous esophagus, gastritis, localized mild inflammation with erythema and granularity on the greater curvature of the stomach consistent with gastric duodenitis. Symptomatic treatment, continue Protonix, additional Pepcid at night. Symptom control with oral and IV opiates.  Nausea medications.  Chest pain, acute coronary syndrome ruled out.  Acute on chronic diastolic congestive heart failure: Reported with increasing shortness of breath and leg swelling.  Seen by cardiology. Started on IV diuresis with good response.  Creatinine worsening.  IV Lasix discontinued today. Potassium supplementation.    CKD stage IIIb: Creatinine creeping up.  Monitor off Lasix.  Paroxysmal A. fib with history of stroke: Rate controlled on metoprolol.  Therapeutic on Eliquis.    Hypokalemia: Replaced and adequate.   Goal of care discussion:  Met with patient's daughter and granddaughter at the bedside 9/24.   Also discussed in detail with patient about some of the untreatable conditions and trying to achieve good symptom control. Seen by palliative.  Remains very frail and debilitated.  If she does not eat well, she [place] even qualify for hospice.    DVT prophylaxis: Place and maintain sequential compression device Start: 04/06/21 1758 apixaban (ELIQUIS) tablet 5 mg   Code Status: Full code Family Communication: Daughter at bedside. Disposition Plan: Status is: Inpatient  Remains inpatient appropriate because:Inpatient level of care appropriate due to severity of illness  Dispo:  Patient From: Home  Planned Disposition: Home, unknown outcome.  Medically stable for discharge: No           Consultants:  Cardiology Gastroenterology Palliative care.  Procedures:  Upper GI endoscopy  Antimicrobials:  None   Subjective: Patient seen and examined.  Overnight, remained very debilitated and sick.  Unable to eat much.  She just trying to [facility] on some small amount of food.  Feels nauseous after eating anything.  Lower chest wall pain and abdominal pain persist. Objective: Vitals:   04/08/21 0749 04/08/21 1141 04/08/21 1200 04/08/21 1212  BP: 109/84  105/77 108/60  Pulse: 95  88 87  Resp: (!) 22  20 15   Temp: 97.7 F (36.5 C)   98.4 F (36.9 C)  TempSrc: Oral   Oral  SpO2: 97% 98% 98% 98%  Weight:      Height:        Intake/Output Summary (Last 24 hours) at 04/08/2021 1400 Last data filed at 04/08/2021 0856 Gross per 24 hour  Intake 120 ml  Output 200 ml  Net -80 ml   Filed Weights   04/06/21 1304 04/06/21 2000 04/07/21 0344  Weight: 84.8  kg 91.6 kg 91.2 kg    Examination:  General exam: Frail debilitated and anxious.  In mild distress due to pain and anxious. Respiratory system: Mostly clear.  Some basal crackles at posterior base present. Cardiovascular system: S1 & S2 heard, RRR.  1+ bilateral pedal edema.   Gastrointestinal system: Mild diffuse tenderness  epigastrium and lower chest wall.  Bowel sounds present. Central nervous system: Alert and oriented. No focal neurological deficits. Generalized weakness.    Data Reviewed: I have personally reviewed following labs and imaging studies  CBC: Recent Labs  Lab 04/05/21 1938 04/07/21 0033  WBC 9.6 7.5  HGB 10.2* 9.7*  HCT 34.8* 33.0*  MCV 89.7 89.4  PLT 127* 456*   Basic Metabolic Panel: Recent Labs  Lab 04/05/21 1938 04/07/21 0033 04/08/21 0104  NA 137 134* 133*  K 3.4* 4.2 4.3  CL 99 100 98  CO2 $Re'27 26 27  'hYe$ GLUCOSE 118* 103* 97  BUN 38* 32* 36*  CREATININE 1.65* 1.49* 1.95*  CALCIUM 9.0 8.6* 8.3*  MG  --   --  2.2  PHOS  --   --  5.3*   GFR: Estimated Creatinine Clearance: 21.4 mL/min (A) (by C-G formula based on SCr of 1.95 mg/dL (H)). Liver Function Tests: Recent Labs  Lab 04/05/21 2143  AST 33  ALT 23  ALKPHOS 56  BILITOT 0.6  PROT 7.6  ALBUMIN 3.7   Recent Labs  Lab 04/05/21 2143  LIPASE 45   No results for input(s): AMMONIA in the last 168 hours. Coagulation Profile: No results for input(s): INR, PROTIME in the last 168 hours. Cardiac Enzymes: No results for input(s): CKTOTAL, CKMB, CKMBINDEX, TROPONINI in the last 168 hours. BNP (last 3 results) Recent Labs    01/11/21 1245 01/24/21 1128 02/01/21 1158  PROBNP 3,759* 2,625* 3,607*   HbA1C: No results for input(s): HGBA1C in the last 72 hours. CBG: No results for input(s): GLUCAP in the last 168 hours. Lipid Profile: No results for input(s): CHOL, HDL, LDLCALC, TRIG, CHOLHDL, LDLDIRECT in the last 72 hours. Thyroid Function Tests: No results for input(s): TSH, T4TOTAL, FREET4, T3FREE, THYROIDAB in the last 72 hours. Anemia Panel: No results for input(s): VITAMINB12, FOLATE, FERRITIN, TIBC, IRON, RETICCTPCT in the last 72 hours. Sepsis Labs: No results for input(s): PROCALCITON, LATICACIDVEN in the last 168 hours.  Recent Results (from the past 240 hour(s))  Resp Panel by RT-PCR (Flu  A&B, Covid) Nasopharyngeal Swab     Status: None   Collection Time: 04/06/21  3:54 AM   Specimen: Nasopharyngeal Swab; Nasopharyngeal(NP) swabs in vial transport medium  Result Value Ref Range Status   SARS Coronavirus 2 by RT PCR NEGATIVE NEGATIVE Final    Comment: (NOTE) SARS-CoV-2 target nucleic acids are NOT DETECTED.  The SARS-CoV-2 RNA is generally detectable in upper respiratory specimens during the acute phase of infection. The lowest concentration of SARS-CoV-2 viral copies this assay can detect is 138 copies/mL. A negative result does not preclude SARS-Cov-2 infection and should not be used as the sole basis for treatment or other patient management decisions. A negative result Mapps occur with  improper specimen collection/handling, submission of specimen other than nasopharyngeal swab, presence of viral mutation(s) within the areas targeted by this assay, and inadequate number of viral copies(<138 copies/mL). A negative result must be combined with clinical observations, patient history, and epidemiological information. The expected result is Negative.  Fact Sheet for Patients:  EntrepreneurPulse.com.au  Fact Sheet for Healthcare Providers:  IncredibleEmployment.be  This test  is no t yet approved or cleared by the [place] and  has been authorized for detection and/or diagnosis of SARS-CoV-2 by FDA under an Emergency Use Authorization (EUA). This EUA will remain  in effect (meaning this test can be used) for the duration of the COVID-19 declaration under Section 564(b)(1) of the Act, 21 U.S.C.section 360bbb-3(b)(1), unless the authorization is terminated  or revoked sooner.       Influenza A by PCR NEGATIVE NEGATIVE Final   Influenza B by PCR NEGATIVE NEGATIVE Final    Comment: (NOTE) The Xpert Xpress SARS-CoV-2/FLU/RSV plus assay is intended as an aid in the diagnosis of influenza from Nasopharyngeal swab specimens  and should not be used as a sole basis for treatment. Nasal washings and aspirates are unacceptable for Xpert Xpress SARS-CoV-2/FLU/RSV testing.  Fact Sheet for Patients: EntrepreneurPulse.com.au  Fact Sheet for Healthcare Providers: IncredibleEmployment.be  This test is not yet approved or cleared by the [place] FDA and has been authorized for detection and/or diagnosis of SARS-CoV-2 by FDA under an Emergency Use Authorization (EUA). This EUA will remain in effect (meaning this test can be used) for the duration of the COVID-19 declaration under Section 564(b)(1) of the Act, 21 U.S.C. section 360bbb-3(b)(1), unless the authorization is terminated or revoked.  Performed at [facility], [place] [address] [place], [place] [address]          Radiology Studies: No results found.      Scheduled Meds:  allopurinol  100 mg Oral Daily   apixaban  5 mg Oral BID   diclofenac Sodium  4 g Topical QID   famotidine  20 mg Oral QHS   gabapentin  600 mg Oral BID   LORazepam  0.5 mg Oral QHS   metoprolol tartrate  75 mg Oral BID   pantoprazole  40 mg Oral BID AC   sucralfate  1 g Oral TID WC & HS   traZODone  100 mg Oral QHS   umeclidinium-vilanterol  1 puff Inhalation Daily   Continuous Infusions:   LOS: 2 days    Time spent: 30 minutes    Barb Merino, MD [facility] Pager [phone]

## 2021-04-08 NOTE — Progress Notes (Addendum)
HOSPITAL MEDICINE OVERNIGHT EVENT NOTE    Notified by nursing that patient continues to experience abdominal pain and despite receiving a dose of Zofran 2 hours ago has still exhibited bouts of vomiting.  Nursing reports that patient is complaining of 7 out of 10 pain with a soft abdomen and epigastric tenderness on exam.  Considering advanced age of 60, will attempt a reduced dose of 5 mg of intravenous Compazine, monitor for response.  Vernelle Emerald  MD [facility]   ADDENDUM (9/26 1AM)  Notified by nursing that patient has exhibited yet another episode of vomitus with suspected aspiration, patient was transiently hypoxic requiring increase of submental oxygen from 4 L to 6 L.  Weaning as able.  Will obtain stat chest x-ray to identify any evidence of significant aspiration.  Sherryll Burger Sharisse Rantz

## 2021-04-08 NOTE — Progress Notes (Signed)
Dr. Sloan Leiter notified that patient has not had any urine output since 3pm on 04/08/21. Bladder scan only shows 140ml of urine.

## 2021-04-08 NOTE — Progress Notes (Signed)
   Patient Name: Desteny Freeman Goodin Date of Encounter: 04/08/2021, 9:59 AM    Subjective  Still with chest and epigastric pain.  About the same.   Objective  BP 109/84 (BP Location: Right Arm)   Pulse 95   Temp 97.7 F (36.5 C) (Oral)   Resp (!) 22   Ht 5\' 4"  (1.626 m)   Wt 91.2 kg   SpO2 97%   BMI 34.50 kg/m  Elderly frail white woman no acute distress resting when I initially approached her Tender chest wall and abdominal wall positive Carnett's sign i.e. this is musculoskeletal abdominal wall pain and musculoskeletal chest pain  Recent Labs  Lab 04/05/21 1938 04/07/21 0033 04/08/21 0104  NA 137 134* 133*  K 3.4* 4.2 4.3  CL 99 100 98  CO2 27 26 27   GLUCOSE 118* 103* 97  BUN 38* 32* 36*  CREATININE 1.65* 1.49* 1.95*  CALCIUM 9.0 8.6* 8.3*  MG  --   --  2.2  PHOS  --   --  5.3*      Assessment and Plan  Musculoskeletal abdominal wall and chest wall pain  Hiatal hernia question symptomatic though I think most of her current symptoms are related to the a nausea and vomiting she had causing the musculoskeletal pain. Also with some focal mild gastritis and tortuous esophagus suggestive of esophageal dysmotility which most 87 year old to have.  All of these things are probably in play with her GI symptoms but we have limited treatment options.   Continue local care with Voltaren gel, rising creatinine and CKD precludes significant NSAID use though perhaps a single dose of Toradol would be reasonable defer to primary team  From the GI perspective or doing is much as we can.  I do not think Carafate is worth it anymore so I will discontinue that.  GI is signing off.  I do not think she needs an outpatient GI follow-up appointment after discharge.   Gatha Mayer, MD, [facility] 04/08/2021 9:59 AM

## 2021-04-08 NOTE — Progress Notes (Addendum)
Progress Note  Patient Name: [name] Date of Encounter: 04/08/2021  [facility] Cardiologist: Mertie Moores, MD   Subjective   Ongoing epigastric pain  Inpatient Medications    Scheduled Meds:  allopurinol  100 mg Oral Daily   apixaban  5 mg Oral BID   diclofenac Sodium  4 g Topical QID   famotidine  20 mg Oral QHS   furosemide  80 mg Intravenous BID   gabapentin  600 mg Oral BID   LORazepam  0.5 mg Oral QHS   metoprolol tartrate  75 mg Oral BID   pantoprazole  40 mg Oral BID AC   sucralfate  1 g Oral TID WC & HS   traZODone  100 mg Oral QHS   umeclidinium-vilanterol  1 puff Inhalation Daily   Continuous Infusions:  PRN Meds: acetaminophen, HYDROmorphone (DILAUDID) injection, oxyCODONE, polyethylene glycol   Vital Signs    Vitals:   04/07/21 2355 04/08/21 0000 04/08/21 0355 04/08/21 0749  BP: (!) 99/55 (!) 92/54 (!) 91/59 109/84  Pulse: 86 93 88 95  Resp: 15 16 16  (!) 22  Temp: 98.2 F (36.8 C)  98.1 F (36.7 C) 97.7 F (36.5 C)  TempSrc: Oral  Oral Oral  SpO2: 98%  99% 97%  Weight:      Height:        Intake/Output Summary (Last 24 hours) at 04/08/2021 3614 Last data filed at 04/08/2021 0400 Gross per 24 hour  Intake 240 ml  Output 200 ml  Net 40 ml   Last 3 Weights 04/07/2021 04/06/2021 04/06/2021  Weight (lbs) 201 lb 201 lb 14.4 oz 186 lb 15.2 oz  Weight (kg) 91.173 kg 91.581 kg 84.8 kg      Telemetry    Rate controlled afib - Personally Reviewed  ECG    N/a - Personally Reviewed  Physical Exam   GEN: No acute distress.   Neck: No JVD Cardiac: irreg Respiratory: Clear to auscultation bilaterally. GI: Soft, nontender, non-distended  MS: No edema; No deformity. Neuro:  Nonfocal  Psych: Normal affect   Labs    High Sensitivity Troponin:   Recent Labs  Lab 04/05/21 1938 04/05/21 2143  TROPONINIHS 12 14     Chemistry Recent Labs  Lab 04/05/21 1938 04/05/21 2143 04/07/21 0033 04/08/21 0104  NA 137  --  134* 133*  K  3.4*  --  4.2 4.3  CL 99  --  100 98  CO2 27  --  26 27  GLUCOSE 118*  --  103* 97  BUN 38*  --  32* 36*  CREATININE 1.65*  --  1.49* 1.95*  CALCIUM 9.0  --  8.6* 8.3*  MG  --   --   --  2.2  PROT  --  7.6  --   --   ALBUMIN  --  3.7  --   --   AST  --  33  --   --   ALT  --  23  --   --   ALKPHOS  --  56  --   --   BILITOT  --  0.6  --   --   GFRNONAA 30*  --  33* 24*  ANIONGAP 11  --  8 8    Lipids No results for input(s): CHOL, TRIG, HDL, LABVLDL, LDLCALC, CHOLHDL in the last 168 hours.  Hematology Recent Labs  Lab 04/05/21 1938 04/07/21 0033  WBC 9.6 7.5  RBC 3.88 3.69*  HGB 10.2*  9.7*  HCT 34.8* 33.0*  MCV 89.7 89.4  MCH 26.3 26.3  MCHC 29.3* 29.4*  RDW 18.7* 18.6*  PLT 127* 104*   Thyroid No results for input(s): TSH, FREET4 in the last 168 hours.  BNP Recent Labs  Lab 04/05/21 1939  BNP 528.0*    DDimer No results for input(s): DDIMER in the last 168 hours.   Radiology    No results found.  Cardiac Studies     Patient Profile     [name] is a 87 y.o. female with a hx of CAD s/p BMS x 2 to RCA in 7622, chronic diastolic heart failure, COPD, permanent Afib on chronic anticoagulation, hx of DVT and TIA, CKD stage IIIb, prior colon cancer, and chronic anemia,  who is being seen 04/06/2021 for the evaluation of epigastric pain and chest pain at the request of Dr. Dyann Kief.    Assessment & Plan    Chest pain - hx of CAD s/p BMS x 2 to RCA in 2009 - presenting symptoms not typical for angina with epigastric pain and nausea/vomiting, trops neg x2 without acute ischemic EKG changes - agree with Dr Debara Pickett no plans for ischemic testing this admission - CTA no acute aortic pathology - from GI note based on EGD suspect symptomatic hiatal hernia   2. Acute on chronic diastolic HF - 12/3333 echo LVEF 60-65%, indet diastolif fx, D shaped septumnormal RV - limited echo pending - BNP 528 on admissoin. Prior values 350s-490s - CXR no acute process. CTA did show  pleural effusions  -I/Os are incomplete. Weights appear inaccurate.  - SHe is on IV lasix 80mg  bid. Significant uptrend in Cr, d/c IV lasix today  3. Chronic afib - rate controlled - she is on eliquis. If persistent elevation in Cr would need to lower eliquis dosing given her age.   For questions or updates, please contact [place] Please consult [email or web address] for contact info under        Signed, Carlyle Dolly, MD  04/08/2021, 8:08 AM

## 2021-04-08 NOTE — Evaluation (Signed)
Occupational Therapy Evaluation Patient Details Name: [name] MRN: [id number] DOB: July 31, 1929 Today's Date: 04/08/2021   History of Present Illness Pt is a 87 y.o. F who presents with complaints of chest pain and shortness of breath. Upper GI showing hiatal hernia and "examined esophagus was mildly tortuous." Significant PMH: HTN, gout, chronic A. fib.   Clinical Impression    Pt pleasant, currently limited in participation requiring frequent rest breaks d/t reports of persistent 7/10 pain and fluctuating O2/arousal; endorses be mod indep with BADLs and mobility with rollator at home, consistent with previous reports. Difficulty with sustaining phone to ear when family called in room. Pt currently demo's significantly decreased activity tolerance, strength and mild seated balance deficits leading to need for increased S/A with ADLs at this time. Pt endorses she might be able to d/c to home with dtr when medically ready, but OT currently recommending post acute at SNF level pending continued improved overall performance. O2 on 3L fluctuating from 83-97 with activity and rest. Nursing made aware.    Recommendations for follow up therapy are one component of a multi-disciplinary discharge planning process, led by the attending physician.  Recommendations [facility] be updated based on patient status, additional functional criteria and insurance authorization.   Follow Up Recommendations  SNF;Supervision/Assistance - 24 hour    Equipment Recommendations  Other (comment) (TBD at next level of care, appears pt has all necessary medical equip at this time)    Recommendations for Other Services       Precautions / Restrictions Precautions Precautions: Fall;Other (comment) Precaution Comments: watch O2 on 3L, HR Restrictions Weight Bearing Restrictions: No      Mobility Bed Mobility Overal bed mobility: Needs Assistance Bed Mobility: Supine to Sit;Sit to Supine     Supine to sit: Min  assist Sit to supine: Min assist   General bed mobility comments: HOb elevated for both transitions, increased difficulty with transition of trunk to upright this date leading to need for min A, demo's good management of LE's out of bed, but min for return to supine. anticipate increased A d/t reports of fatigue    Transfers                 General transfer comment: deferred transition to standing this date d/t fatigue and request for return to sitting. while seated EOB, on 3L O2 dropping to 86 and sustaining.    Balance Overall balance assessment: Needs assistance             Standing balance comment: Sup for static sitting at EOB                           ADL either performed or assessed with clinical judgement   ADL Overall ADL's : Needs assistance/impaired Eating/Feeding: Set up;Bed level   Grooming: Sitting;Supervision/safety;Set up   Upper Body Bathing: Moderate assistance;Bed level   Lower Body Bathing: Moderate assistance;Maximal assistance;Bed level   Upper Body Dressing : Minimal assistance;Sitting   Lower Body Dressing: Moderate assistance (seated EOB) Lower Body Dressing Details (indicate cue type and reason): up to max for donning over hips limited by pain and fatigue this date     Toileting- Clothing Manipulation and Hygiene: Moderate assistance         General ADL Comments: Session significantly limited by pain, fluctuating O2 status nursing made aware     Vision Baseline Vision/History: 1 Wears glasses Ability to See in Adequate Light: 0 Adequate Vision  Assessment?: No apparent visual deficits     Perception Perception Perception Tested?: No   Praxis      Pertinent Vitals/Pain Pain Assessment: 0-10 Pain Score: 7  Pain Location: epigastric Pain Descriptors / Indicators: Discomfort;Constant;Grimacing Pain Intervention(s): Limited activity within patient's tolerance;RN gave pain meds during session     Hand Dominance  Right   Extremity/Trunk Assessment Upper Extremity Assessment Upper Extremity Assessment: Generalized weakness (Limitations in R shoulder from rotator cuff deficits baseline)   Lower Extremity Assessment Lower Extremity Assessment: Defer to PT evaluation   Cervical / Trunk Assessment Cervical / Trunk Assessment: Kyphotic   Communication Communication Communication: No difficulties   Cognition Arousal/Alertness: Lethargic Behavior During Therapy: WFL for tasks assessed/performed Overall Cognitive Status: Within Functional Limits for tasks assessed                                 General Comments: pts arousal state flutating throughout session, able to sustain conversation but difficulty keeping eyes open. noted to have recieved pain medication just prior to OT arrival; appropriate in responses throughout sesisons so OT without significant concerns for cognition. nursing made aware   General Comments       Exercises     Shoulder Instructions      Home Living Family/patient expects to be discharged to:: Private residence Living Arrangements: Alone Available Help at Discharge: Family;Available PRN/intermittently Type of Home: House Home Access: Ramped entrance     Home Layout: Two level;Able to live on main level with bedroom/bathroom Alternate Level Stairs-Number of Steps: pt does not go downstairs to basement   Bathroom Shower/Tub: Occupational psychologist: Standard Bathroom Accessibility: Yes   Home Equipment: Environmental consultant - 2 wheels;Walker - 4 wheels;Bedside commode;Shower seat;Hand held shower head;Grab bars - tub/shower;Cane - quad;Wheelchair - Education administrator (comment) (Lift chair)          Prior Functioning/Environment Level of Independence: Independent with assistive device(s)        Comments: Uses Rollator, does not drive; daughter provides transportation        OT Problem List: Decreased strength;Decreased range of motion;Decreased  activity tolerance;Impaired balance (sitting and/or standing);Decreased knowledge of use of DME or AE;Decreased knowledge of precautions;Pain      OT Treatment/Interventions: Self-care/ADL training;Therapeutic exercise;Neuromuscular education;Energy conservation;Manual therapy;DME and/or AE instruction;Therapeutic activities;Balance training;Patient/family education    OT Goals(Current goals can be found in the care plan section) Acute Rehab OT Goals Patient Stated Goal: to stop hurting so much OT Goal Formulation: With patient Time For Goal Achievement: 04/22/21 Potential to Achieve Goals: Good ADL Goals Pt Will Perform Grooming: with modified independence Pt Will Perform Upper Body Dressing: with modified independence Pt Will Perform Lower Body Dressing: with modified independence Pt Will Perform Toileting - Clothing Manipulation and hygiene: with modified independence  OT Frequency: Min 2X/week   Barriers to D/C: Decreased caregiver support          Co-evaluation              AM-PAC OT "6 Clicks" Daily Activity     Outcome Measure Help from another person eating meals?: A Little Help from another person taking care of personal grooming?: A Little Help from another person toileting, which includes using toliet, bedpan, or urinal?: A Lot Help from another person bathing (including washing, rinsing, drying)?: A Lot Help from another person to put on and taking off regular upper body clothing?: A Little Help from another person to  put on and taking off regular lower body clothing?: A Lot 6 Click Score: 15   End of Session Equipment Utilized During Treatment: Oxygen Nurse Communication: Mobility status;Other (comment) (pain and O2 requirements and performance during session)  Activity Tolerance: Patient limited by pain;Patient limited by lethargy;Patient limited by fatigue Patient left: in bed;with bed alarm set;with call bell/phone within reach;Other (comment) (nursing made  aware of status at end of session)  OT Visit Diagnosis: Unsteadiness on feet (R26.81);Muscle weakness (generalized) (M62.81)                Time: 5449-2010 OT Time Calculation (min): 33 min Charges:  OT General Charges $OT Visit: 1 Visit OT Evaluation $OT Eval Low Complexity: 1 Low  Wilhelm Ganaway OTR/L acute rehab services Office: [phone] 04/08/2021, 11:27 AM

## 2021-04-09 ENCOUNTER — Other Ambulatory Visit (HOSPITAL_COMMUNITY): Payer: HMO

## 2021-04-09 ENCOUNTER — Inpatient Hospital Stay (HOSPITAL_COMMUNITY): Payer: HMO

## 2021-04-09 ENCOUNTER — Encounter (HOSPITAL_COMMUNITY): Payer: Self-pay | Admitting: Internal Medicine

## 2021-04-09 DIAGNOSIS — R627 Adult failure to thrive: Secondary | ICD-10-CM

## 2021-04-09 DIAGNOSIS — N179 Acute kidney failure, unspecified: Secondary | ICD-10-CM

## 2021-04-09 DIAGNOSIS — I4821 Permanent atrial fibrillation: Secondary | ICD-10-CM

## 2021-04-09 DIAGNOSIS — I5033 Acute on chronic diastolic (congestive) heart failure: Secondary | ICD-10-CM | POA: Diagnosis not present

## 2021-04-09 DIAGNOSIS — Z515 Encounter for palliative care: Secondary | ICD-10-CM

## 2021-04-09 DIAGNOSIS — R0902 Hypoxemia: Secondary | ICD-10-CM

## 2021-04-09 LAB — URINALYSIS, ROUTINE W REFLEX MICROSCOPIC
Bilirubin Urine: NEGATIVE
Glucose, UA: NEGATIVE mg/dL
Hgb urine dipstick: NEGATIVE
Ketones, ur: NEGATIVE mg/dL
Leukocytes,Ua: NEGATIVE
Nitrite: NEGATIVE
Protein, ur: NEGATIVE mg/dL
Specific Gravity, Urine: 1.015 (ref 1.005–1.030)
pH: 5 (ref 5.0–8.0)

## 2021-04-09 LAB — BASIC METABOLIC PANEL
Anion gap: 10 (ref 5–15)
BUN: 48 mg/dL — ABNORMAL HIGH (ref 8–23)
CO2: 24 mmol/L (ref 22–32)
Calcium: 8.4 mg/dL — ABNORMAL LOW (ref 8.9–10.3)
Chloride: 97 mmol/L — ABNORMAL LOW (ref 98–111)
Creatinine, Ser: 2.7 mg/dL — ABNORMAL HIGH (ref 0.44–1.00)
GFR, Estimated: 16 mL/min — ABNORMAL LOW (ref >60)
Glucose, Bld: 180 mg/dL — ABNORMAL HIGH (ref 70–99)
Potassium: 4.1 mmol/L (ref 3.5–5.1)
Sodium: 131 mmol/L — ABNORMAL LOW (ref 135–145)

## 2021-04-09 LAB — CBC WITH DIFFERENTIAL/PLATELET
Abs Immature Granulocytes: 0.03 10*3/uL (ref 0.00–0.07)
Basophils Absolute: 0 10*3/uL (ref 0.0–0.1)
Basophils Relative: 0 %
Eosinophils Absolute: 0 10*3/uL (ref 0.0–0.5)
Eosinophils Relative: 0 %
HCT: 30.8 % — ABNORMAL LOW (ref 36.0–46.0)
Hemoglobin: 9.5 g/dL — ABNORMAL LOW (ref 12.0–15.0)
Immature Granulocytes: 0 %
Lymphocytes Relative: 4 %
Lymphs Abs: 0.4 10*3/uL — ABNORMAL LOW (ref 0.7–4.0)
MCH: 27.3 pg (ref 26.0–34.0)
MCHC: 30.8 g/dL (ref 30.0–36.0)
MCV: 88.5 fL (ref 80.0–100.0)
Monocytes Absolute: 0.8 10*3/uL (ref 0.1–1.0)
Monocytes Relative: 9 %
Neutro Abs: 8.4 10*3/uL — ABNORMAL HIGH (ref 1.7–7.7)
Neutrophils Relative %: 87 %
Platelets: 123 10*3/uL — ABNORMAL LOW (ref 150–400)
RBC: 3.48 MIL/uL — ABNORMAL LOW (ref 3.87–5.11)
RDW: 18.2 % — ABNORMAL HIGH (ref 11.5–15.5)
WBC: 9.6 10*3/uL (ref 4.0–10.5)
nRBC: 0 % (ref 0.0–0.2)

## 2021-04-09 LAB — RESP PANEL BY RT-PCR (FLU A&B, COVID) ARPGX2
Influenza A by PCR: NEGATIVE
Influenza B by PCR: NEGATIVE
SARS Coronavirus 2 by RT PCR: NEGATIVE

## 2021-04-09 LAB — GLUCOSE, CAPILLARY: Glucose-Capillary: 148 mg/dL — ABNORMAL HIGH (ref 70–99)

## 2021-04-09 LAB — BRAIN NATRIURETIC PEPTIDE: B Natriuretic Peptide: 886.4 pg/mL — ABNORMAL HIGH (ref 0.0–100.0)

## 2021-04-09 MED ORDER — GABAPENTIN 400 MG PO CAPS: 400.0000 mg | ORAL_CAPSULE | Freq: Two times a day (BID) | ORAL | Status: DC

## 2021-04-09 MED ORDER — HYDROMORPHONE HCL 1 MG/ML IJ SOLN
0.5000 mg | INTRAMUSCULAR | Status: DC | PRN
  Administered 2021-04-09 – 2021-04-10 (×9): 0.5 mg via INTRAVENOUS
  Filled 2021-04-09 (×9): qty 0.5

## 2021-04-09 MED ORDER — APIXABAN 2.5 MG PO TABS: 2.5000 mg | ORAL_TABLET | Freq: Two times a day (BID) | ORAL | Status: DC

## 2021-04-09 MED ORDER — FUROSEMIDE 10 MG/ML IJ SOLN
80.0000 mg | Freq: Once | INTRAMUSCULAR | Status: AC
  Administered 2021-04-09: 80 mg via INTRAVENOUS
  Filled 2021-04-09: qty 8

## 2021-04-09 MED ORDER — PROCHLORPERAZINE EDISYLATE 10 MG/2ML IJ SOLN: 10.0000 mg | Freq: Four times a day (QID) | INTRAMUSCULAR | Status: DC | PRN

## 2021-04-09 NOTE — Progress Notes (Addendum)
Progress Note  Patient Name: [name] Date of Encounter: 04/09/2021  [facility] Cardiologist: Mertie Moores, MD   Subjective   Still w/ epigastric pain very sedated from multiple meds given overnight. Rouses to loud verbal Increased WOB Febrile this am  Inpatient Medications    Scheduled Meds:  allopurinol  100 mg Oral Daily   apixaban  5 mg Oral BID   diclofenac Sodium  4 g Topical QID   famotidine  20 mg Oral QHS   feeding supplement  237 mL Oral BID BM   gabapentin  600 mg Oral BID   LORazepam  0.5 mg Oral QHS   metoprolol tartrate  75 mg Oral BID   pantoprazole  40 mg Oral BID AC   sucralfate  1 g Oral TID WC & HS   traZODone  100 mg Oral QHS   umeclidinium-vilanterol  1 puff Inhalation Daily   Continuous Infusions:  PRN Meds: acetaminophen, HYDROmorphone (DILAUDID) injection, ondansetron (ZOFRAN) IV, oxyCODONE, polyethylene glycol   Vital Signs    Vitals:   04/08/21 1628 04/08/21 1954 04/08/21 2357 04/09/21 0344  BP: 95/62 132/78 105/63 (!) 93/58  Pulse: 83 94 99   Resp: 16 19 14 16   Temp: 98.2 F (36.8 C) 97.9 F (36.6 C) 97.9 F (36.6 C) 97.9 F (36.6 C)  TempSrc: Oral Oral Oral Axillary  SpO2: 97% 93% 96% 96%  Weight:      Height:        Intake/Output Summary (Last 24 hours) at 04/09/2021 0650 Last data filed at 04/08/2021 1600 Gross per 24 hour  Intake 120 ml  Output --  Net 120 ml   Last 3 Weights 04/07/2021 04/06/2021 04/06/2021  Weight (lbs) 201 lb 201 lb 14.4 oz 186 lb 15.2 oz  Weight (kg) 91.173 kg 91.581 kg 84.8 kg      Telemetry    Afib, rate gradually trending up overnight - Personally Reviewed  ECG    N/a - Personally Reviewed  Physical Exam   GEN: +acute distress.   Neck: JVD 10 cm Cardiac: Irreg R&R, no murmur, no rubs, or gallops.  Respiratory: diminished to auscultation bilaterally with rales in the bases. Poor insp effort GI: Soft, nontender at this time, non-distended  MS: No edema; No deformity. Neuro:   Nonfocal  Psych: Very sleepy affect   Labs    High Sensitivity Troponin:   Recent Labs  Lab 04/05/21 1938 04/05/21 2143  TROPONINIHS 12 14     Chemistry Recent Labs  Lab 04/05/21 2143 04/07/21 0033 04/08/21 0104 04/09/21 0054  NA  --  134* 133* 131*  K  --  4.2 4.3 4.1  CL  --  100 98 97*  CO2  --  26 27 24   GLUCOSE  --  103* 97 180*  BUN  --  32* 36* 48*  CREATININE  --  1.49* 1.95* 2.70*  CALCIUM  --  8.6* 8.3* 8.4*  MG  --   --  2.2  --   PROT 7.6  --   --   --   ALBUMIN 3.7  --   --   --   AST 33  --   --   --   ALT 23  --   --   --   ALKPHOS 56  --   --   --   BILITOT 0.6  --   --   --   GFRNONAA  --  33* 24* 16*  ANIONGAP  --  8 8 10     Lipids No results for input(s): CHOL, TRIG, HDL, LABVLDL, LDLCALC, CHOLHDL in the last 168 hours.  Hematology Recent Labs  Lab 04/05/21 1938 04/07/21 0033  WBC 9.6 7.5  RBC 3.88 3.69*  HGB 10.2* 9.7*  HCT 34.8* 33.0*  MCV 89.7 89.4  MCH 26.3 26.3  MCHC 29.3* 29.4*  RDW 18.7* 18.6*  PLT 127* 104*   Thyroid No results for input(s): TSH, FREET4 in the last 168 hours.  BNP Recent Labs  Lab 04/05/21 1939 04/09/21 0054  BNP 528.0* 886.4*    DDimer No results for input(s): DDIMER in the last 168 hours.   Radiology    DG Chest Port 1 View  Result Date: 04/09/2021 CLINICAL DATA:  Aspiration EXAM: PORTABLE CHEST 1 VIEW COMPARISON:  04/05/2021 FINDINGS: Lung volumes are small and pulmonary insufflation has decreased slightly since prior examination. Small bilateral pleural effusions have developed, left greater than right. There is engorgement of the central pulmonary vasculature, progressive since prior examination, without superimposed overt pulmonary edema. No pneumothorax. Cardiac size is mildly enlarged, unchanged. No acute bone abnormality. IMPRESSION: Stable cardiomegaly. Interval development of small bilateral pleural effusions and progressive pulmonary vascular congestion in keeping with changes of mild  cardiogenic failure. Electronically Signed   By: Fidela Salisbury M.D.   On: 04/09/2021 01:26    Cardiac Studies   LIMITED ECHO: 04/09/2021 -  pending  ECHO:  11/11/2020 (compared to 2019)  1. Left ventricular ejection fraction, by estimation, is 60 to 65%. The  left ventricle has normal function. The left ventricle has no regional  wall motion abnormalities. There is mild concentric left ventricular  hypertrophy. Left ventricular diastolic function could not be evaluated. There is the interventricular septum is flattened in diastole ('D' shaped left ventricle), consistent with right ventricular volume overload.   2. Right ventricular systolic function is normal. The right ventricular  size is normal. There is moderately elevated pulmonary artery systolic  pressure. The estimated right ventricular systolic pressure is 01.0 mmHg.   3. Left atrial size was moderately dilated.   4. Right atrial size was moderately dilated.   5. The mitral valve is normal in structure. Mild to moderate mitral valve  regurgitation.   6. Tricuspid valve regurgitation is moderate to severe.   7. The aortic valve is normal in structure. Aortic valve regurgitation is  trivial. No aortic stenosis is present.   8. The inferior vena cava is dilated in size with <50% respiratory  variability, suggesting right atrial pressure of 15 mmHg.   Comparison(s): Tricuspid regurgitation and pulmonary artery hypertension  have worsened.   Patient Profile     [name] is a 87 y.o. female with a hx of CAD s/p BMS x 2 to RCA in 2725, chronic diastolic heart failure, COPD, permanent Afib on chronic anticoagulation, hx of DVT and TIA, CKD stage IIIb, prior colon cancer, and chronic anemia,  who is being seen 04/06/2021 for the evaluation of epigastric pain and chest pain at the request of Dr. Dyann Kief.    Assessment & Plan    Chest pain, actually epigastric pain w/ N&V -  s/p BMS x 2 RCA 2009  - still w/ epigastric pain  -  trop neg x 2, ECG no change - no further cardiac eval indicated - CTA no dissection - GI has seen, s/p EGD, suspect symptomatic HH   2. Acute on chronic diastolic HF - 36/6440 echo above, ?diast dysf, EF nl, +RV overload - limited  echo ordered - BNP climbing, new CHF/effusions on CXR - O2 sats 88% on 2L, being increased to high flow - Cr 1.49 >> 2.70 since admit - got Lasix 80 mg IV x 2 doses on 09/24, d/c'd - I/O very incomplete - IM MD present, ordering repeat COVID test  3. Chronic afib - rate trending up a little, feel 2nd acute illness - on Eliquis 5 mg bid, make per pharmacy as she Saephanh need decreased dose  For questions or updates, please contact [facility] Please consult [email or web address] for contact info under        Signed, Rosaria Ferries, PA-C  04/09/2021, 6:50 AM

## 2021-04-09 NOTE — Progress Notes (Signed)
PT Cancellation Note  Patient Details Name: [name] MRN: [id number] DOB: September 20, 1929   Cancelled Treatment:    Reason Eval/Treat Not Completed: Medical issues which prohibited therapy Discussed with RN; pt not appropriate, sedated from multiple meds overnight and is febrile.  Wyona Almas, PT, DPT Acute Rehabilitation Services Pager [phone] Office [phone]    Deno Etienne 04/09/2021, 11:42 AM

## 2021-04-09 NOTE — Progress Notes (Signed)
Patient ID: [name], female   DOB: [birth year], 87 y.o.   MRN: [id number]    Progress Note from the Palliative Medicine Team at [facility]   Patient Name: [name]        Date: 04/09/2021 DOB: 1930-06-02  Age: 87 y.o. MRN#: [id number] Attending Physician: Barb Merino, MD Primary Care Physician: [name], NP Admit Date: 04/05/2021   Medical records reviewed   87 y.o. female  with past medical history of nonbleeding superficial small gastric ulcers, colon polyps, HTN, gout, anxiety, depression, a fib on eliquis, CKD, and CHF admitted on 04/05/2021 with chest pain and shortness of breath. Troponin and EKG negative. Upon further investigation symptoms seemed r/t upper GI. Endoscopy completed and revealed hiatal hernia and esophageal dysmotility. Per GI, chest wall pain and abd pain r/t vomiting. Patient on PPI and sucralfate. Patient also with acute on chronic HF - continues on IV lasix. PMT consulted to discuss [place].   Patient is frail and debilitated.  She continues to decline and spite of full medical support.  She has had increasing oxygen demands over the past 24 hours.  She is generally uncomfortable with nausea, vomiting and abdominal pain.  Poor p.o. intake.   Family face treatment option decisions, advanced directive decisions and anticipatory care needs.    This NP visited patient at the bedside as a follow up to for palliative medicine needs and emotional support.  Patient is lethargic, speech is garbled and she appears generally uncomfortable.  Daughter and nephew at bedside.  Continue conversation with daughter regarding current medical situation; diagnosis, prognosis, goals of care, end-of-life wishes, disposition and options.  Family verbalized an understanding of the overall limited prognosis.  They verbalize a sense of knowing that patient's time is limited and ultimately they do not want her to suffer.  Education offered on the natural trajectory and  expectations at EOL offered.    Questions and concerns addressed.  Hard choices booklet left for review  Education offered on the difference between aggressive medical intervention path and a palliative comfort path for this patient at this time in this situation.  Education offered on residential hospice benefit.  Focus of care is comfort, quality and dignity  Plan of care -DNR/DNI -No artificial feeding or hydration now or in the future -No further diagnostics or life prolonging measures -Symptom management         -Dilaudid IV 0.5 mg IV every 1 hour as needed for pain or dyspnea         -Ativan IV 1 mg IV every 6 hours as needed for agitation         -Place Foley catheter for end-of-life care -Family request [facility]/residential hospice for end-of-life care (prognosis is less than 2 weeks)   Questions and concerns addressed   Discussed with Dr Raelyn Mora and LCSW/Nadia via secure chat  Total time spent on the unit was 70 minutes   Greater than 50% of the time was spent in counseling and coordination of care  Wadie Lessen NP  Palliative Medicine Team Team Phone # [phone] Pager [phone]

## 2021-04-09 NOTE — Progress Notes (Signed)
ANTICOAGULATION CONSULT NOTE - Follow Up Consult  Pharmacy Consult for Eliquis Indication:  history of DVT, TIA, AFib  No Known Allergies  Patient Measurements: Height: 5\' 4"  (162.6 cm) Weight: 91.2 kg (201 lb) IBW/kg (Calculated) : 54.7  Vital Signs: Temp: 99.2 F (37.3 C) (09/26 0730) Temp Source: Axillary (09/26 0730) BP: 113/77 (09/26 0730) Pulse Rate: 94 (09/26 0730)  Labs: Recent Labs    04/07/21 0033 04/08/21 0104 04/09/21 0054  HGB 9.7*  --   --   HCT 33.0*  --   --   PLT 104*  --   --   CREATININE 1.49* 1.95* 2.70*    Estimated Creatinine Clearance: 15.5 mL/min (A) (by C-G formula based on SCr of 2.7 mg/dL (H)).   Assessment: 87 year old female on Eliquis 5 mg po BID prior to admission  Scr 2.7 today  Goal of Therapy:   Monitor platelets by anticoagulation protocol: Yes   Plan:  Decrease Eliquis to 2.5 mg po BID with worsening renal failure Pharmacy to sign off and follow peripherally for improvement in renal function  Thank you Anette Guarneri, PharmD  04/09/2021,10:24 AM

## 2021-04-09 NOTE — Progress Notes (Signed)
PROGRESS NOTE    [name]  RKY:[id number] DOB: 25-Oct-1929 DOA: 04/05/2021 PCP: [name], NP    Brief Narrative:  87 year old female who is very debilitated, has history of nonbleeding superficial small gastric ulcers, colon polyps, hypertension, gout, chronic anxiety and depression, chronic A. fib on Eliquis presented to emergency room with chest pain and shortness of breath.  In the emergency room she was found to have significant upper GI symptoms, started on Protonix and admitted.  Also consulted gastroenterology as well as cardiology.  Patient also found with fluid overload, was treated with IV Lasix with worsening renal functions.  Patient continues to remain debilitated, started developing more shortness of breath, worsening renal functions, severe ongoing abdominal pain and difficult to control symptoms. 9/26, decided to pursue comfort care measures and referred to hospice.   Assessment & Plan:   Active Problems:   Acute on chronic diastolic heart failure (HCC)   Large hiatal hernia   Musculoskeletal chest pain   Abdominal wall pain   Gastritis and gastroduodenitis  Dyspepsia/ gastritis and gastroduodenitis/symptomatic large hiatal hernia: Still with significant pain.  This is mostly related to large hiatal hernia, peptic ulcer disease as well as pain exacerbated with nausea and vomiting. Upper GI endoscopy 9/23 with tortuous esophagus, gastritis, localized mild inflammation with erythema and granularity on the greater curvature of the stomach consistent with gastric duodenitis. Unrelenting pain without evidence of acute coronary syndrome. Unable to eat and failure to thrive.  See goal of care discussion below.  Acute hypoxemic respiratory failure secondary to diastolic heart failure and fluid overload.  Chest pain, acute coronary syndrome ruled out.  Acute on chronic diastolic congestive heart failure: Reported with increasing shortness of breath and leg swelling.  Seen  by cardiology.  1 dose of IV Lasix today as comfort care measures.  Unable to tolerate further diuresis.   CKD stage IIIb: Creatinine creeping up.  Anticipate worsening.  She is not a dialysis candidate.  Paroxysmal A. fib with history of stroke: Rate controlled on metoprolol.  Therapeutic on Eliquis.  Will discontinue today.  Hypokalemia: Replaced and adequate.   Goal of care discussion:  Patient with frailty and debilitated. Worsening oxygen requirement today.  Palliative was involved on 9/24. Patient remains uncomfortable and unable to eat, ongoing nausea vomiting and pain. Met with daughter and granddaughter at the bedside, recommended discussion with palliative and hospice. Palliative provider saw the patient today and initiated comfort care measures.    DVT prophylaxis:   Comfort care   Code Status: Comfort care Family Communication: Daughter and granddaughter at the bedside. Disposition Plan: Status is: Inpatient  Remains inpatient appropriate because:Inpatient level of care appropriate due to severity of illness  Dispo:  Patient From: Home  Planned Disposition: Inpatient hospice  Medically stable for discharge: No, only stable to transfer to inpatient hospice     Consultants:  Cardiology Gastroenterology Palliative care.  Procedures:  Upper GI endoscopy  Antimicrobials:  None   Subjective: Patient seen and examined.  Today she was more lethargic.  She was minimally responding to conversation.  Complains of ongoing pain in the abdomen.  She tells me that she feels very uncomfortable.  Daughter and granddaughter at the bedside.  Due to minimal reinvestigation for her fever including chest x-ray overnight that shows fluid overload, repeat COVID-19 test was negative.  Urinalysis looks clear. Ultimately, decision was made to convert her to comfort care measures.  Objective: Vitals:   04/08/21 1954 04/08/21 2357 04/09/21 0344 04/09/21 0730  BP: 132/78 105/63  (!) 93/58 113/77  Pulse: 94 99  94  Resp: _0 Temp: 97.9 F (36.6 C) 97.9 F (36.6 C) 97.9 F (36.6 C) 99.2 F (37.3 C)  TempSrc: Oral Oral Axillary Axillary  SpO2: 93% 96% 96% (!) 89%  Weight:      Height:        Intake/Output Summary (Last 24 hours) at 04/09/2021 1227 Last data filed at 04/09/2021 0957 Gross per 24 hour  Intake 0 ml  Output 450 ml  Net -450 ml   Filed Weights   04/06/21 1304 04/06/21 2000 04/07/21 0344  Weight: 84.8 kg 91.6 kg 91.2 kg    Examination:  General exam: Frail debilitated and anxious.  Sick looking and in moderate distress today. Currently on 11 L of oxygen. Respiratory system: Conducted airway sounds.  Bilateral expiratory crackles and wheezes. Cardiovascular system: S1 & S2 heard, RRR.  1+ bilateral pedal edema.   Gastrointestinal system: Mild diffuse tenderness epigastrium and lower chest wall.  Bowel sounds present. Central nervous system:  Lethargic and sleepy.  In moderate distress.  Anxious and restless at times. Follows simple commands.  Moves all extremities but very lethargic.    Data Reviewed: I have personally reviewed following labs and imaging studies  CBC: Recent Labs  Lab 04/05/21 1938 04/07/21 0033 04/09/21 1022  WBC 9.6 7.5 9.6  NEUTROABS  --   --  8.4*  HGB 10.2* 9.7* 9.5*  HCT 34.8* 33.0* 30.8*  MCV 89.7 89.4 88.5  PLT 127* 104* 003*   Basic Metabolic Panel: Recent Labs  Lab 04/05/21 1938 04/07/21 0033 04/08/21 0104 04/09/21 0054  NA 137 134* 133* 131*  K 3.4* 4.2 4.3 4.1  CL 99 100 98 97*  CO2 _1 GLUCOSE 118* 103* 97 180*  BUN 38* 32* 36* 48*  CREATININE 1.65* 1.49* 1.95* 2.70*  CALCIUM 9.0 8.6* 8.3* 8.4*  MG  --   --  2.2  --   PHOS  --   --  5.3*  --    GFR: Estimated Creatinine Clearance: 15.5 mL/min (A) (by C-G formula based on SCr of 2.7 mg/dL (H)). Liver Function Tests: Recent Labs  Lab 04/05/21 2143  AST 33  ALT 23  ALKPHOS 56  BILITOT 0.6  PROT 7.6  ALBUMIN  3.7   Recent Labs  Lab 04/05/21 2143  LIPASE 45   No results for input(s): AMMONIA in the last 168 hours. Coagulation Profile: No results for input(s): INR, PROTIME in the last 168 hours. Cardiac Enzymes: No results for input(s): CKTOTAL, CKMB, CKMBINDEX, TROPONINI in the last 168 hours. BNP (last 3 results) Recent Labs    01/11/21 1245 01/24/21 1128 02/01/21 1158  PROBNP 3,759* 2,625* 3,607*   HbA1C: No results for input(s): HGBA1C in the last 72 hours. CBG: Recent Labs  Lab 04/09/21 1116  GLUCAP 148*   Lipid Profile: No results for input(s): CHOL, HDL, LDLCALC, TRIG, CHOLHDL, LDLDIRECT in the last 72 hours. Thyroid Function Tests: No results for input(s): TSH, T4TOTAL, FREET4, T3FREE, THYROIDAB in the last 72 hours. Anemia Panel: No results for input(s): VITAMINB12, FOLATE, FERRITIN, TIBC, IRON, RETICCTPCT in the last 72 hours. Sepsis Labs: No results for input(s): PROCALCITON, LATICACIDVEN in the last 168 hours.  Recent Results (from the past 240 hour(s))  Resp Panel by RT-PCR (Flu A&B, Covid) Nasopharyngeal Swab     Status: None   Collection Time: 04/06/21  3:54 AM   Specimen: Nasopharyngeal Swab; Nasopharyngeal(NP)  swabs in vial transport medium  Result Value Ref Range Status   SARS Coronavirus 2 by RT PCR NEGATIVE NEGATIVE Final    Comment: (NOTE) SARS-CoV-2 target nucleic acids are NOT DETECTED.  The SARS-CoV-2 RNA is generally detectable in upper respiratory specimens during the acute phase of infection. The lowest concentration of SARS-CoV-2 viral copies this assay can detect is 138 copies/mL. A negative result does not preclude SARS-Cov-2 infection and should not be used as the sole basis for treatment or other patient management decisions. A negative result [place] occur with  improper specimen collection/handling, submission of specimen other than nasopharyngeal swab, presence of viral mutation(s) within the areas targeted by this assay, and inadequate  number of viral copies(<138 copies/mL). A negative result must be combined with clinical observations, patient history, and epidemiological information. The expected result is Negative.  Fact Sheet for Patients:  EntrepreneurPulse.com.au  Fact Sheet for Healthcare Providers:  IncredibleEmployment.be  This test is no t yet approved or cleared by the [place] FDA and  has been authorized for detection and/or diagnosis of SARS-CoV-2 by FDA under an Emergency Use Authorization (EUA). This EUA will remain  in effect (meaning this test can be used) for the duration of the COVID-19 declaration under Section 564(b)(1) of the Act, 21 U.S.C.section 360bbb-3(b)(1), unless the authorization is terminated  or revoked sooner.       Influenza A by PCR NEGATIVE NEGATIVE Final   Influenza B by PCR NEGATIVE NEGATIVE Final    Comment: (NOTE) The Xpert Xpress SARS-CoV-2/FLU/RSV plus assay is intended as an aid in the diagnosis of influenza from Nasopharyngeal swab specimens and should not be used as a sole basis for treatment. Nasal washings and aspirates are unacceptable for Xpert Xpress SARS-CoV-2/FLU/RSV testing.  Fact Sheet for Patients: EntrepreneurPulse.com.au  Fact Sheet for Healthcare Providers: IncredibleEmployment.be  This test is not yet approved or cleared by the [place] FDA and has been authorized for detection and/or diagnosis of SARS-CoV-2 by FDA under an Emergency Use Authorization (EUA). This EUA will remain in effect (meaning this test can be used) for the duration of the COVID-19 declaration under Section 564(b)(1) of the Act, 21 U.S.C. section 360bbb-3(b)(1), unless the authorization is terminated or revoked.  Performed at [facility], [place] [address], [place], [place] [address]   Resp Panel by RT-PCR (Flu A&B, Covid) Nasopharyngeal Swab     Status: None   Collection Time: 04/09/21  10:03 AM   Specimen: Nasopharyngeal Swab; Nasopharyngeal(NP) swabs in vial transport medium  Result Value Ref Range Status   SARS Coronavirus 2 by RT PCR NEGATIVE NEGATIVE Final    Comment: (NOTE) SARS-CoV-2 target nucleic acids are NOT DETECTED.  The SARS-CoV-2 RNA is generally detectable in upper respiratory specimens during the acute phase of infection. The lowest concentration of SARS-CoV-2 viral copies this assay can detect is 138 copies/mL. A negative result does not preclude SARS-Cov-2 infection and should not be used as the sole basis for treatment or other patient management decisions. A negative result [place] occur with  improper specimen collection/handling, submission of specimen other than nasopharyngeal swab, presence of viral mutation(s) within the areas targeted by this assay, and inadequate number of viral copies(<138 copies/mL). A negative result must be combined with clinical observations, patient history, and epidemiological information. The expected result is Negative.  Fact Sheet for Patients:  EntrepreneurPulse.com.au  Fact Sheet for Healthcare Providers:  IncredibleEmployment.be  This test is no t yet approved or cleared by the [place] and  has been authorized for detection and/or diagnosis of SARS-CoV-2 by FDA under an Emergency Use Authorization (EUA). This EUA will remain  in effect (meaning this test can be used) for the duration of the COVID-19 declaration under Section 564(b)(1) of the Act, 21 U.S.C.section 360bbb-3(b)(1), unless the authorization is terminated  or revoked sooner.       Influenza A by PCR NEGATIVE NEGATIVE Final   Influenza B by PCR NEGATIVE NEGATIVE Final    Comment: (NOTE) The Xpert Xpress SARS-CoV-2/FLU/RSV plus assay is intended as an aid in the diagnosis of influenza from Nasopharyngeal swab specimens and should not be used as a sole basis for treatment. Nasal washings and aspirates  are unacceptable for Xpert Xpress SARS-CoV-2/FLU/RSV testing.  Fact Sheet for Patients: EntrepreneurPulse.com.au  Fact Sheet for Healthcare Providers: IncredibleEmployment.be  This test is not yet approved or cleared by the [place] FDA and has been authorized for detection and/or diagnosis of SARS-CoV-2 by FDA under an Emergency Use Authorization (EUA). This EUA will remain in effect (meaning this test can be used) for the duration of the COVID-19 declaration under Section 564(b)(1) of the Act, 21 U.S.C. section 360bbb-3(b)(1), unless the authorization is terminated or revoked.  Performed at [facility], [place] [address] [place], [place] [address]   Culture, blood (routine x 2)     Status: None (Preliminary result)   Collection Time: 04/09/21 10:13 AM   Specimen: BLOOD RIGHT ARM  Result Value Ref Range Status   Specimen Description BLOOD RIGHT ARM  Final   Special Requests   Final    BOTTLES DRAWN AEROBIC ONLY Blood Culture results [place] not be optimal due to an inadequate volume of blood received in culture bottles   Culture   Final    NO GROWTH <12 HOURS Performed at [facility], [place] [address], [place], [place] [address]    Report Status PENDING  Incomplete  Culture, blood (routine x 2)     Status: None (Preliminary result)   Collection Time: 04/09/21 10:22 AM   Specimen: BLOOD RIGHT HAND  Result Value Ref Range Status   Specimen Description BLOOD RIGHT HAND  Final   Special Requests   Final    BOTTLES DRAWN AEROBIC AND ANAEROBIC Blood Culture adequate volume   Culture   Final    NO GROWTH <12 HOURS Performed at [facility], [place] [address], [place],  [address]    Report Status PENDING  Incomplete         Radiology Studies: DG Chest Port 1 View  Result Date: 04/09/2021 CLINICAL DATA:  Aspiration EXAM: PORTABLE CHEST 1 VIEW COMPARISON:  04/05/2021 FINDINGS: Lung volumes are small and pulmonary  insufflation has decreased slightly since prior examination. Small bilateral pleural effusions have developed, left greater than right. There is engorgement of the central pulmonary vasculature, progressive since prior examination, without superimposed overt pulmonary edema. No pneumothorax. Cardiac size is mildly enlarged, unchanged. No acute bone abnormality. IMPRESSION: Stable cardiomegaly. Interval development of small bilateral pleural effusions and progressive pulmonary vascular congestion in keeping with changes of mild cardiogenic failure. Electronically Signed   By: Fidela Salisbury M.D.   On: 04/09/2021 01:26        Scheduled Meds:  diclofenac Sodium  4 g Topical QID   feeding supplement  237 mL Oral BID BM   furosemide  80 mg Intravenous Once   umeclidinium-vilanterol  1 puff Inhalation Daily   Continuous Infusions:   LOS: 3 days  Time spent: 30 minutes    Barb Merino, MD [facility] Pager [phone]

## 2021-04-09 NOTE — Progress Notes (Addendum)
Manufacturing engineer [facility]) Hospital Liaison note.    5:35pm:  Received return call from pt's daughter, [name]. Discussed possibility of transferring tomorrow to [facility], pending continued bed availability.  Kathy tearful reflecting on pt's quick decline and on how much pain and nausea pt has had.  [name] open to transfer tomorrow as long as pt is considered stable.  [place] liaisons will follow tomorrow.   Chart reviewed and eligibility confirmed for [facility].   [place] liaison Bevely Palmer visited at bedside to confirm interest and explain services.   Called pt's daughter [name] at [phone] but was unable to reach her to discuss a bed offer.  HIPAA compliant voicemail left.  Thank you for the opportunity to participate in this patient's care.  Domenic Moras, BSN, RN [facility] Liaison (listed on [place] under Hospice[facility])    [phone] (24h on call)

## 2021-04-09 NOTE — TOC Progression Note (Signed)
Transition of Care [facility]) - Progression Note    Patient Details  Name: [name] MRN: [id number] Date of Birth: 09-15-1929  Transition of Care [facility]) CM/SW [place], LCSW Phone Number: 04/09/2021, 1:44 PM  Clinical Narrative:    CSW received consult for hospice placement. Daughter requests [employer]. CSW sent referral for review.    Expected Discharge Plan: [place] Barriers to Discharge: Continued Medical Work up  Expected Discharge Plan and Services Expected Discharge Plan: [place] In-house Referral: Clinical Social Work Discharge Planning Services: CM Consult Post Acute Care Choice: [place] arrangements for the past 2 months: Single Family Home                                       Social Determinants of Health (SDOH) Interventions    Readmission Risk Interventions No flowsheet data found.

## 2021-04-09 NOTE — Care Management Important Message (Signed)
Important Message  Patient Details  Name: [name] MRN: [id number] Date of Birth: 12/01/1929   Medicare Important Message Given:  Yes     [name] [name] 04/09/2021, 4:18 PM

## 2021-04-10 ENCOUNTER — Telehealth: Payer: Self-pay | Admitting: Cardiovascular Disease

## 2021-04-10 DIAGNOSIS — Z515 Encounter for palliative care: Secondary | ICD-10-CM

## 2021-04-10 DIAGNOSIS — R52 Pain, unspecified: Secondary | ICD-10-CM

## 2021-04-10 DIAGNOSIS — K279 Peptic ulcer, site unspecified, unspecified as acute or chronic, without hemorrhage or perforation: Secondary | ICD-10-CM

## 2021-04-10 DIAGNOSIS — J90 Pleural effusion, not elsewhere classified: Secondary | ICD-10-CM

## 2021-04-10 DIAGNOSIS — I5032 Chronic diastolic (congestive) heart failure: Secondary | ICD-10-CM

## 2021-04-10 LAB — BLOOD CULTURE ID PANEL (REFLEXED) - BCID2

## 2021-04-10 LAB — URINE CULTURE: Culture: NO GROWTH

## 2021-04-10 MED ORDER — DICLOFENAC SODIUM 1 % EX GEL: 4.0000 g | Freq: Four times a day (QID) | CUTANEOUS | Status: AC

## 2021-04-10 MED ORDER — FUROSEMIDE 20 MG PO TABS: ORAL_TABLET | ORAL | 3 refills | Status: AC

## 2021-04-10 MED ORDER — HYDROMORPHONE HCL 1 MG/ML IJ SOLN: 0.5000 mg | INTRAMUSCULAR | 0 refills | Status: AC | PRN

## 2021-04-10 MED ORDER — PROCHLORPERAZINE EDISYLATE 10 MG/2ML IJ SOLN: 10.0000 mg | Freq: Four times a day (QID) | INTRAMUSCULAR | Status: AC | PRN

## 2021-04-10 NOTE — Progress Notes (Signed)
PHARMACY - PHYSICIAN COMMUNICATION CRITICAL VALUE ALERT - BLOOD CULTURE IDENTIFICATION (BCID)  [name] is an 87 y.o. female who presented to [facility] on 04/05/2021 with a chief complaint of epigastic pain/CP    Assessment:  Lab called with BCID result 1/3 bottles with staph species. Pt is on comfort care.   Name of physician (or Provider) Contacted: Dr Cyd Silence  Current antibiotics: None  Changes to prescribed antibiotics recommended:  None  Results for orders placed or performed during the hospital encounter of 04/05/21  Blood Culture ID Panel (Reflexed) (Collected: 04/09/2021 10:22 AM)  Result Value Ref Range   Enterococcus faecalis NOT DETECTED NOT DETECTED   Enterococcus Faecium NOT DETECTED NOT DETECTED   Listeria monocytogenes NOT DETECTED NOT DETECTED   Staphylococcus species DETECTED (A) NOT DETECTED   Staphylococcus aureus (BCID) NOT DETECTED NOT DETECTED   Staphylococcus epidermidis NOT DETECTED NOT DETECTED   Staphylococcus lugdunensis NOT DETECTED NOT DETECTED   Streptococcus species NOT DETECTED NOT DETECTED   Streptococcus agalactiae NOT DETECTED NOT DETECTED   Streptococcus pneumoniae NOT DETECTED NOT DETECTED   Streptococcus pyogenes NOT DETECTED NOT DETECTED   A.calcoaceticus-baumannii NOT DETECTED NOT DETECTED   Bacteroides fragilis NOT DETECTED NOT DETECTED   Enterobacterales NOT DETECTED NOT DETECTED   Enterobacter cloacae complex NOT DETECTED NOT DETECTED   Escherichia coli NOT DETECTED NOT DETECTED   Klebsiella aerogenes NOT DETECTED NOT DETECTED   Klebsiella oxytoca NOT DETECTED NOT DETECTED   Klebsiella pneumoniae NOT DETECTED NOT DETECTED   Proteus species NOT DETECTED NOT DETECTED   Salmonella species NOT DETECTED NOT DETECTED   Serratia marcescens NOT DETECTED NOT DETECTED   Haemophilus influenzae NOT DETECTED NOT DETECTED   Neisseria meningitidis NOT DETECTED NOT DETECTED   Pseudomonas aeruginosa NOT DETECTED NOT DETECTED   Stenotrophomonas  maltophilia NOT DETECTED NOT DETECTED   Candida albicans NOT DETECTED NOT DETECTED   Candida auris NOT DETECTED NOT DETECTED   Candida glabrata NOT DETECTED NOT DETECTED   Candida krusei NOT DETECTED NOT DETECTED   Candida parapsilosis NOT DETECTED NOT DETECTED   Candida tropicalis NOT DETECTED NOT DETECTED   Cryptococcus neoformans/gattii NOT DETECTED NOT DETECTED    Onnie Boer, PharmD, BCIDP, AAHIVP, CPP Infectious Disease Pharmacist 04/10/2021 5:33 AM

## 2021-04-10 NOTE — Discharge Summary (Signed)
Physician Discharge Summary  [name] OVZ:[id number] DOB: 12/20/1929 DOA: 04/05/2021  PCP: Whitney Peng, NP  Admit date: 04/05/2021 Discharge date: 04/10/2021  Admitted From: home  Disposition:  [name], hospice care  CODE STATUS:DNR, comfort care Diet recommendation: comfort diet  Brief/Interim Summary: Patient admitted with chest pain non-cardiac. Related to hiatal hernia and esophageal dysmotility as seen on EGD. She continued to deteriorate while admitted including fluid overload, respiratory distress, kidney failure which were not responsive to medical management and patient became minimally responsive. Family and palliative had many discussions on [name] and decided to proceed with comfort care.   Discharge Diagnoses:  Active Problems:   Acute on chronic diastolic heart failure (HCC)   Large hiatal hernia   Musculoskeletal chest pain   Abdominal wall pain   Gastritis and gastroduodenitis   End of life care    Allergies as of 04/10/2021   No Known Allergies      Medication List     STOP taking these medications    albuterol 108 (90 Base) MCG/ACT inhaler Commonly known as: ProAir HFA   Anoro Ellipta 62.5-25 MCG/INH Aepb Generic drug: umeclidinium-vilanterol   apixaban 5 MG Tabs tablet Commonly known as: Eliquis   doxycycline 100 MG capsule Commonly known as: VIBRAMYCIN   E-Z Spacer inhaler   esomeprazole 40 MG capsule Commonly known as: NEXIUM   famotidine 20 MG tablet Commonly known as: PEPCID   ferrous sulfate 325 (65 FE) MG EC tablet   furosemide 20 MG tablet Commonly known as: LASIX   gabapentin 600 MG tablet Commonly known as: NEURONTIN   losartan 25 MG tablet Commonly known as: Cozaar   metoprolol tartrate 50 MG tablet Commonly known as: LOPRESSOR   potassium chloride 10 MEQ tablet Commonly known as: KLOR-CON   sertraline 50 MG tablet Commonly known as: ZOLOFT   traZODone 100 MG tablet Commonly known as: DESYREL   vitamin C  1000 MG tablet       TAKE these medications    acetaminophen 500 MG tablet Commonly known as: TYLENOL Take 1,000 mg by mouth every 6 (six) hours as needed for moderate pain.   allopurinol 100 MG tablet Commonly known as: ZYLOPRIM TAKE ONE TABLET BY MOUTH ONCE DAILY   diclofenac Sodium 1 % Gel Commonly known as: VOLTAREN Apply 4 g topically 4 (four) times daily.   HYDROcodone-acetaminophen 5-325 MG tablet Commonly known as: NORCO/VICODIN Take 1 tablet by mouth every 6 (six) hours as needed for severe pain.   HYDROmorphone 1 MG/ML injection Commonly known as: DILAUDID Inject 0.5 mLs (0.5 mg total) into the vein every hour as needed for severe pain (Dyspnea).   LORazepam 0.5 MG tablet Commonly known as: ATIVAN TAKE ONE TABLET BY MOUTH EVERYDAY AT BEDTIME What changed: See the new instructions.   nitroGLYCERIN 0.4 MG SL tablet Commonly known as: NITROSTAT Place 1 tablet (0.4 mg total) under the tongue every 5 (five) minutes as needed for chest pain (x 3 pills). Reported on 09/01/2015   prochlorperazine 10 MG/2ML injection Commonly known as: COMPAZINE Inject 2 mLs (10 mg total) into the vein every 6 (six) hours as needed.   silver sulfADIAZINE 1 % cream Commonly known as: SILVADENE Apply 1 application topically 2 (two) times daily as needed (rash).   triamcinolone cream 0.1 % Commonly known as: KENALOG Apply 1 application topically 2 (two) times daily.        Follow-up Information     Whitney Peng, NP.   Specialty: Family Medicine Contact  information: [name] [address] [phone]                No Known Allergies  Consultations: Palliative Cardiology GI    Procedures/Studies: DG Chest 2 View  Result Date: 04/05/2021 CLINICAL DATA:  Chest pain. EXAM: CHEST - 2 VIEW COMPARISON:  Chest x-ray 12/05/2020. FINDINGS: The aorta is tortuous in the cardiac silhouette is enlarged, unchanged. There is some strandy opacities  in the lung bases favored as atelectasis. There is no pleural effusion or pneumothorax. Cervical spinal fusion plate is partially visualized. IMPRESSION: Stable cardiomegaly. No evidence for pneumonia or edema. Electronically Signed   By: Ronney Asters M.D.   On: 04/05/2021 19:54   DG Chest Port 1 View  Result Date: 04/09/2021 CLINICAL DATA:  Aspiration EXAM: PORTABLE CHEST 1 VIEW COMPARISON:  04/05/2021 FINDINGS: Lung volumes are small and pulmonary insufflation has decreased slightly since prior examination. Small bilateral pleural effusions have developed, left greater than right. There is engorgement of the central pulmonary vasculature, progressive since prior examination, without superimposed overt pulmonary edema. No pneumothorax. Cardiac size is mildly enlarged, unchanged. No acute bone abnormality. IMPRESSION: Stable cardiomegaly. Interval development of small bilateral pleural effusions and progressive pulmonary vascular congestion in keeping with changes of mild cardiogenic failure. Electronically Signed   By: Fidela Salisbury M.D.   On: 04/09/2021 01:26   CT Angio Chest/Abd/Pel for Dissection W and/or Wo Contrast  Result Date: 04/06/2021 CLINICAL DATA:  87 year old female with pain. EXAM: CT ANGIOGRAPHY CHEST, ABDOMEN AND PELVIS TECHNIQUE: Non-contrast CT of the chest was initially obtained. Multidetector CT imaging through the chest, abdomen and pelvis was performed using the standard protocol during bolus administration of intravenous contrast. Multiplanar reconstructed images and MIPs were obtained and reviewed to evaluate the vascular anatomy. CONTRAST:  25mL OMNIPAQUE IOHEXOL 350 MG/ML SOLN COMPARISON:  CTA chest 11/10/2020. CT Abdomen and Pelvis 07/21/2019 and earlier. FINDINGS: CTA CHEST FINDINGS Cardiovascular: Chronic aortic calcified atherosclerosis. Chronic coronary artery calcified atherosclerosis. Mildly tortuous thoracic aorta but negative for aneurysm or dissection. Proximal great  vessels appear patent with tortuosity. Contrast in the central pulmonary arteries which appear patent. Stable cardiomegaly. No pericardial effusion. Mediastinum/Nodes: Stable moderate gastric hiatal hernia. No mediastinal mass or lymphadenopathy. Lungs/Pleura: Major airways are patent. Mildly lower lung volumes compared to April, and there is enhancing bilateral lower lobe atelectasis superimposed on new small layering pleural effusions. Chronic scarring in the right middle lobe is stable. Mild mosaic attenuation elsewhere in the lungs is chronic and could reflect small vessel or small airway disease. No consolidation or other acute pulmonary opacity. Musculoskeletal: Prior cervical ACDF. Congenital incomplete segmentation of T12-L1 with underlying scoliosis. Stable thoracic spine degeneration including at the adjacent T11-T12 segment with vacuum disc. Chronically advanced degeneration at both shoulders. No acute osseous abnormality identified. Review of the MIP images confirms the above findings. CTA ABDOMEN AND PELVIS FINDINGS VASCULAR Aortoiliac calcified atherosclerosis. Mildly tortuous aorta and iliac arteries are stable since last year with no dissection or aneurysm. Major abdominal aortic branches remain patent. Proximal femoral arteries remain patent with calcified atherosclerosis. Review of the MIP images confirms the above findings. NON-VASCULAR Hepatobiliary: Chronically absent gallbladder.  Stable liver. Pancreas: Negative. Spleen: Negative. Adrenals/Urinary Tract: Stable, negative. Chronic pelvic phleboliths. Stomach/Bowel: Chronic sigmoid colon anastomosis with no adverse features. Nondilated upstream large bowel, retained stool similar to the CT last year. Chronic postoperative changes also at the cecum with neo terminal ileum. No dilated small bowel. Chronic gastric hernia. Intra-abdominal stomach remarkable for  mild inflammatory stranding along the distal greater curve seen on series 6, image 178  and coronal series 9, image 47. This is proximal to the antrum. The duodenum is decompressed and within normal limits. And no other gastro duodenal inflammation is identified. No free air. No free fluid. Chronic postoperative changes to the ventral abdominal wall. And chronic fat containing inguinal hernias, where as that on the right contains a small volume of complex fluid now on series 6, image 262. But no other complicating features. Intra-abdominal Lymphatic: No lymphadenopathy. Reproductive: Surgically absent uterus. Diminutive or absent ovaries. Other: No pelvic free fluid. Musculoskeletal: Chronic versus congenital lumbar interbody ankylosis, except at L4-L5 and L5-S1 where advanced disc and facet degeneration with vacuum phenomena appears stable. No acute osseous abnormality identified. Review of the MIP images confirms the above findings. IMPRESSION: 1. Negative for aortic dissection or aneurysm. Aortic Atherosclerosis (ICD10-I70.0). 2. Mild inflammatory stranding along the distal greater curve of the stomach is new since last year and suspicious for acute peptic ulcer disease. No complicating features. 3. New small layering pleural effusions with bilateral lower lobe atelectasis. 4. Otherwise stable CT appearance of the chest, abdomen, and pelvis. Including: Chronic cardiomegaly. Chronic hiatal hernia. Previous partial colectomy and neo-terminal ileum. Electronically Signed   By: Genevie Ann M.D.   On: 04/06/2021 04:40    Subjective: Patient is resting comfortably. She awakens to voice. Her son is at bedside. Neither of them have concerns or questions at this time. They are ready to go to [facility] so she can have more family come to visit.   Discharge Exam: Vitals:   04/09/21 2348 04/10/21 0820  BP: (!) 71/47 (P) 115/67  Pulse: 60   Resp: 15 (P) 18  Temp: 97.7 F (36.5 C) (!) (P) 97.4 F (36.3 C)  SpO2:     Vitals:   04/09/21 0344 04/09/21 0730 04/09/21 2348 04/10/21 0820  BP: (!) 93/58  113/77 (!) 71/47 (P) 115/67  Pulse:  94 60   Resp: 16 19 15  (P) 18  Temp: 97.9 F (36.6 C) 99.2 F (37.3 C) 97.7 F (36.5 C) (!) (P) 97.4 F (36.3 C)  TempSrc: Axillary Axillary Axillary (P) Oral  SpO2: 96% (!) 89%    Weight:      Height:        General: Pt is alert, awake, not in acute distress Cardiovascular: peripheral limbs well perfused Respiratory: normal respiratory effort [place]: alert and oriented to self and location  Microbiology: Recent Results (from the past 240 hour(s))  Resp Panel by RT-PCR (Flu A&B, Covid) Nasopharyngeal Swab     Status: None   Collection Time: 04/06/21  3:54 AM   Specimen: Nasopharyngeal Swab; Nasopharyngeal(NP) swabs in vial transport medium  Result Value Ref Range Status   SARS Coronavirus 2 by RT PCR NEGATIVE NEGATIVE Final    Comment: (NOTE) SARS-CoV-2 target nucleic acids are NOT DETECTED.  The SARS-CoV-2 RNA is generally detectable in upper respiratory specimens during the acute phase of infection. The lowest concentration of SARS-CoV-2 viral copies this assay can detect is 138 copies/mL. A negative result does not preclude SARS-Cov-2 infection and should not be used as the sole basis for treatment or other patient management decisions. A negative result [place] occur with  improper specimen collection/handling, submission of specimen other than nasopharyngeal swab, presence of viral mutation(s) within the areas targeted by this assay, and inadequate number of viral copies(<138 copies/mL). A negative result must be combined with clinical observations, patient history, and epidemiological  information. The expected result is Negative.  Fact Sheet for Patients:  EntrepreneurPulse.com.au  Fact Sheet for Healthcare Providers:  IncredibleEmployment.be  This test is no t yet approved or cleared by the [place] FDA and  has been authorized for detection and/or diagnosis of SARS-CoV-2 by FDA under an  Emergency Use Authorization (EUA). This EUA will remain  in effect (meaning this test can be used) for the duration of the COVID-19 declaration under Section 564(b)(1) of the Act, 21 U.S.C.section 360bbb-3(b)(1), unless the authorization is terminated  or revoked sooner.       Influenza A by PCR NEGATIVE NEGATIVE Final   Influenza B by PCR NEGATIVE NEGATIVE Final    Comment: (NOTE) The Xpert Xpress SARS-CoV-2/FLU/RSV plus assay is intended as an aid in the diagnosis of influenza from Nasopharyngeal swab specimens and should not be used as a sole basis for treatment. Nasal washings and aspirates are unacceptable for Xpert Xpress SARS-CoV-2/FLU/RSV testing.  Fact Sheet for Patients: EntrepreneurPulse.com.au  Fact Sheet for Healthcare Providers: IncredibleEmployment.be  This test is not yet approved or cleared by the [place] FDA and has been authorized for detection and/or diagnosis of SARS-CoV-2 by FDA under an Emergency Use Authorization (EUA). This EUA will remain in effect (meaning this test can be used) for the duration of the COVID-19 declaration under Section 564(b)(1) of the Act, 21 U.S.C. section 360bbb-3(b)(1), unless the authorization is terminated or revoked.  Performed at [facility], [place] [address] [place], [place] [address]   Urine Culture     Status: None   Collection Time: 04/09/21  9:48 AM   Specimen: Urine, Catheterized  Result Value Ref Range Status   Specimen Description URINE, CATHETERIZED  Final   Special Requests NONE  Final   Culture   Final    NO GROWTH Performed at [facility], [address], [place], [place] [address]    Report Status 04/10/2021 FINAL  Final  Resp Panel by RT-PCR (Flu A&B, Covid) Nasopharyngeal Swab     Status: None   Collection Time: 04/09/21 10:03 AM   Specimen: Nasopharyngeal Swab; Nasopharyngeal(NP) swabs in vial transport medium  Result Value Ref Range Status   SARS  Coronavirus 2 by RT PCR NEGATIVE NEGATIVE Final    Comment: (NOTE) SARS-CoV-2 target nucleic acids are NOT DETECTED.  The SARS-CoV-2 RNA is generally detectable in upper respiratory specimens during the acute phase of infection. The lowest concentration of SARS-CoV-2 viral copies this assay can detect is 138 copies/mL. A negative result does not preclude SARS-Cov-2 infection and should not be used as the sole basis for treatment or other patient management decisions. A negative result [place] occur with  improper specimen collection/handling, submission of specimen other than nasopharyngeal swab, presence of viral mutation(s) within the areas targeted by this assay, and inadequate number of viral copies(<138 copies/mL). A negative result must be combined with clinical observations, patient history, and epidemiological information. The expected result is Negative.  Fact Sheet for Patients:  EntrepreneurPulse.com.au  Fact Sheet for Healthcare Providers:  IncredibleEmployment.be  This test is no t yet approved or cleared by the [place] FDA and  has been authorized for detection and/or diagnosis of SARS-CoV-2 by FDA under an Emergency Use Authorization (EUA). This EUA will remain  in effect (meaning this test can be used) for the duration of the COVID-19 declaration under Section 564(b)(1) of the Act, 21 U.S.C.section 360bbb-3(b)(1), unless the authorization is terminated  or revoked sooner.  Influenza A by PCR NEGATIVE NEGATIVE Final   Influenza B by PCR NEGATIVE NEGATIVE Final    Comment: (NOTE) The Xpert Xpress SARS-CoV-2/FLU/RSV plus assay is intended as an aid in the diagnosis of influenza from Nasopharyngeal swab specimens and should not be used as a sole basis for treatment. Nasal washings and aspirates are unacceptable for Xpert Xpress SARS-CoV-2/FLU/RSV testing.  Fact Sheet for  Patients: EntrepreneurPulse.com.au  Fact Sheet for Healthcare Providers: IncredibleEmployment.be  This test is not yet approved or cleared by the [place] FDA and has been authorized for detection and/or diagnosis of SARS-CoV-2 by FDA under an Emergency Use Authorization (EUA). This EUA will remain in effect (meaning this test can be used) for the duration of the COVID-19 declaration under Section 564(b)(1) of the Act, 21 U.S.C. section 360bbb-3(b)(1), unless the authorization is terminated or revoked.  Performed at [facility], [place] [address], [place], [place] [address]   Culture, blood (routine x 2)     Status: None (Preliminary result)   Collection Time: 04/09/21 10:13 AM   Specimen: BLOOD RIGHT ARM  Result Value Ref Range Status   Specimen Description BLOOD RIGHT ARM  Final   Special Requests   Final    BOTTLES DRAWN AEROBIC ONLY Blood Culture results [place] not be optimal due to an inadequate volume of blood received in culture bottles   Culture   Final    NO GROWTH < 12 HOURS Performed at [facility], [place] [address], [place], [place] [address]    Report Status PENDING  Incomplete  Culture, blood (routine x 2)     Status: None (Preliminary result)   Collection Time: 04/09/21 10:22 AM   Specimen: BLOOD RIGHT HAND  Result Value Ref Range Status   Specimen Description BLOOD RIGHT HAND  Final   Special Requests   Final    BOTTLES DRAWN AEROBIC AND ANAEROBIC Blood Culture adequate volume   Culture  Setup Time   Final    GRAM POSITIVE COCCI AEROBIC BOTTLE ONLY CRITICAL RESULT CALLED TO, READ BACK BY AND VERIFIED WITH: PHARMD [place] PHAM 04/10/21@5 :27 BY TW    Culture   Final    NO GROWTH < 12 HOURS Performed at [facility], [place] [address] [place], [place] [address]    Report Status PENDING  Incomplete  Blood Culture ID Panel (Reflexed)     Status: Abnormal   Collection Time: 04/09/21 10:22 AM  Result Value Ref  Range Status   Enterococcus faecalis NOT DETECTED NOT DETECTED Final   Enterococcus Faecium NOT DETECTED NOT DETECTED Final   Listeria monocytogenes NOT DETECTED NOT DETECTED Final   Staphylococcus species DETECTED (A) NOT DETECTED Final    Comment: CRITICAL RESULT CALLED TO, READ BACK BY AND VERIFIED WITH: PHARMD [place] PHAM 04/10/21@5 :27 BY TW    Staphylococcus aureus (BCID) NOT DETECTED NOT DETECTED Final   Staphylococcus epidermidis NOT DETECTED NOT DETECTED Final   Staphylococcus lugdunensis NOT DETECTED NOT DETECTED Final   Streptococcus species NOT DETECTED NOT DETECTED Final   Streptococcus agalactiae NOT DETECTED NOT DETECTED Final   Streptococcus pneumoniae NOT DETECTED NOT DETECTED Final   Streptococcus pyogenes NOT DETECTED NOT DETECTED Final   A.calcoaceticus-baumannii NOT DETECTED NOT DETECTED Final   Bacteroides fragilis NOT DETECTED NOT DETECTED Final   Enterobacterales NOT DETECTED NOT DETECTED Final   Enterobacter cloacae complex NOT DETECTED NOT DETECTED Final   Escherichia coli NOT DETECTED NOT DETECTED Final   Klebsiella aerogenes NOT DETECTED NOT DETECTED Final  Klebsiella oxytoca NOT DETECTED NOT DETECTED Final   Klebsiella pneumoniae NOT DETECTED NOT DETECTED Final   Proteus species NOT DETECTED NOT DETECTED Final   Salmonella species NOT DETECTED NOT DETECTED Final   Serratia marcescens NOT DETECTED NOT DETECTED Final   Haemophilus influenzae NOT DETECTED NOT DETECTED Final   Neisseria meningitidis NOT DETECTED NOT DETECTED Final   Pseudomonas aeruginosa NOT DETECTED NOT DETECTED Final   Stenotrophomonas maltophilia NOT DETECTED NOT DETECTED Final   Candida albicans NOT DETECTED NOT DETECTED Final   Candida auris NOT DETECTED NOT DETECTED Final   Candida glabrata NOT DETECTED NOT DETECTED Final   Candida krusei NOT DETECTED NOT DETECTED Final   Candida parapsilosis NOT DETECTED NOT DETECTED Final   Candida tropicalis NOT DETECTED NOT DETECTED Final    Cryptococcus neoformans/gattii NOT DETECTED NOT DETECTED Final    Comment: Performed at [facility], [place] [address], [place], [place] [address]     Labs: BNP (last 3 results) Recent Labs    11/10/20 1400 04/05/21 1939 04/09/21 0054  BNP 490.6* 528.0* 517.0*   Basic Metabolic Panel: Recent Labs  Lab 04/05/21 1938 04/07/21 0033 04/08/21 0104 04/09/21 0054  NA 137 134* 133* 131*  K 3.4* 4.2 4.3 4.1  CL 99 100 98 97*  CO2 27 26 27 24   GLUCOSE 118* 103* 97 180*  BUN 38* 32* 36* 48*  CREATININE 1.65* 1.49* 1.95* 2.70*  CALCIUM 9.0 8.6* 8.3* 8.4*  MG  --   --  2.2  --   PHOS  --   --  5.3*  --    Liver Function Tests: Recent Labs  Lab 04/05/21 2143  AST 33  ALT 23  ALKPHOS 56  BILITOT 0.6  PROT 7.6  ALBUMIN 3.7   Recent Labs  Lab 04/05/21 2143  LIPASE 45    CBC: Recent Labs  Lab 04/05/21 1938 04/07/21 0033 04/09/21 1022  WBC 9.6 7.5 9.6  NEUTROABS  --   --  8.4*  HGB 10.2* 9.7* 9.5*  HCT 34.8* 33.0* 30.8*  MCV 89.7 89.4 88.5  PLT 127* 104* 123*    Time coordinating discharge: Over 30 minutes  Richarda Osmond, MD  [facility] 04/10/2021, 11:29 AM Pager   If 7PM-7AM, please contact night-coverage [email or web address] Password [email or web address]

## 2021-04-10 NOTE — TOC Progression Note (Signed)
Transition of Care [facility]) - Progression Note    Patient Details  Name: Anwyn Kriegel Falconi MRN: [id number] Date of Birth: 03-24-1930  Transition of Care [facility]) CM/SW [place], LCSW Phone Number: 04/10/2021, 12:46 PM  Clinical Narrative:    CSW left voicemail for [facility] to submit an ambulance authorization for patient to go to Hospice facility.    Expected Discharge Plan: [place] Barriers to Discharge: Continued Medical Work up  Expected Discharge Plan and Services Expected Discharge Plan: [place] In-house Referral: Clinical Social Work Discharge Planning Services: CM Consult Post Acute Care Choice: [place] arrangements for the past 2 months: Single Family Home Expected Discharge Date: 04/10/21                                     Social Determinants of Health (SDOH) Interventions    Readmission Risk Interventions No flowsheet data found.

## 2021-04-10 NOTE — Progress Notes (Signed)
Manufacturing engineer [facility]) Hospital Liaison note.   C hart reviewed and eligibility confirmed for [facility]. Spoke with family to confirm interest and explain services. Family agreeable to transfer today. TOC aware.    [facility] will notify TOC when registration paperwork has been completed to arrange transport.   RN please call report to [phone].  Please be sure a signed DNR transports with the pt.  Thank you for the opportunity to participate in this patient's care.  [name], BSN, RN [facility] Liaison (listed on [place] under Hospice[facility])    [phone] (24h on call)

## 2021-04-10 NOTE — Progress Notes (Signed)
Patient ID: [name], female   DOB: Dufrane 19, 1931, 87 y.o.   MRN: [id number]    Progress Note from the Palliative Medicine Team at [facility]   Patient Name: [name]        Date: 04/10/2021 DOB: March 03, 1930  Age: 87 y.o. MRN#: [id number] Attending Physician: Richarda Osmond, MD Primary Care Physician: [name], NP Admit Date: 04/05/2021   Medical records reviewed   87 y.o. female  with past medical history of nonbleeding superficial small gastric ulcers, colon polyps, HTN, gout, anxiety, depression, a fib on eliquis, CKD, and CHF admitted on 04/05/2021 with chest pain and shortness of breath. Troponin and EKG negative. Upon further investigation symptoms seemed r/t upper GI. Endoscopy completed and revealed hiatal hernia and esophageal dysmotility. Per GI, chest wall pain and abd pain r/t vomiting. Patient on PPI and sucralfate. Patient also with acute on chronic HF - continues on IV lasix. PMT consulted to discuss [place].   Patient is frail and debilitated.  She continues to decline and spite of full medical support.  She has had increasing oxygen demands over the past 24 hours.  She is generally uncomfortable with nausea, vomiting and abdominal pain.  Poor p.o. intake.   Family face treatment option decisions, advanced directive decisions and anticipatory care needs.    This NP visited patient at the bedside as a follow up to for palliative medicine needs and emotional support.  Patient is lethargic, speech is garbled and she appears generally uncomfortable.  Daughter and nephew at bedside.  Continue conversation with daughter regarding current medical situation; diagnosis, prognosis, goals of care, end-of-life wishes, disposition and options.  Family verbalized an understanding of the overall limited prognosis.  They verbalize a sense of knowing that patient's time is limited and ultimately they do not want her to suffer.  Education offered on the natural trajectory and  expectations at EOL offered.    Questions and concerns addressed.  Hard choices booklet left for review  Education offered on the difference between aggressive medical intervention path and a palliative comfort path for this patient at this time in this situation.  Education offered on residential hospice benefit.  Focus of care is comfort, quality and dignity  Plan of care -DNR/DNI -No artificial feeding or hydration now or in the future -No further diagnostics or life prolonging measures -Symptom management         -Dilaudid IV 0.5 mg IV every 1 hour as needed for pain or dyspnea         -Ativan IV 1 mg IV every 6 hours as needed for agitation         -Place Foley catheter for end-of-life care -Family request [facility]/residential hospice for end-of-life care (prognosis is less than 2 weeks)   Questions and concerns addressed   Discussed with Dr Raelyn Mora and LCSW/Nadia via secure chat  Total time spent on the unit was 35 minutes   Greater than 50% of the time was spent in counseling and coordination of care  Wadie Lessen NP  Palliative Medicine Team Team Phone # [phone] Pager [phone]

## 2021-04-10 NOTE — Progress Notes (Signed)
Report was called to Butch Penny, RN @ [facility].Pt picked up at this time by [facility] for transport.

## 2021-04-10 NOTE — Telephone Encounter (Signed)
Refill sent.

## 2021-04-10 NOTE — Telephone Encounter (Signed)
Pt c/o medication issue:  1. Name of Medication: furosemide (LASIX) 20 MG tablet  2. How are you currently taking this medication (dosage and times per day)? Take three tablets (60mg ) once per day. Take and additional 3 tablets (60mg ) as needed for weight gain of 2 lbs overnight or 5 lbs in one week.  3. Are you having a reaction (difficulty breathing--STAT)? No   4. What is your medication issue? Patient needs a 3 month supply instead of 30 day supply.

## 2021-04-10 NOTE — TOC Transition Note (Signed)
Transition of Care [facility]) - CM/SW Discharge Note   Patient Details  Name: [name] MRN: [id number] Date of Birth: [birth year]  Transition of [facility] CM/SW Contact:  Benard Halsted, LCSW Phone Number: 04/10/2021, 2:14 PM   Clinical Narrative:    Patient will DC to: [facility] Anticipated DC date: 04/10/21 Family notified: Daughter, [name]  Transport by: Corey Harold   Per MD patient ready for DC to [facility]. RN to call report prior to discharge [phone]). RN, patient, patient's family, and facility notified of DC. Discharge Summary sent to facility. DC packet on chart. Ambulance transport requested for patient.   CSW will sign off for now as social work intervention is no longer needed. Please consult [place] again if new needs arise.     Final next level of care: [place] Barriers to Discharge: Barriers Resolved   Patient Goals and CMS Choice Patient states their goals for this hospitalization and ongoing recovery are:: comfort CMS Medicare.gov Compare Post Acute Care list provided to:: Patient Choice offered to / list presented to : Patient, Adult Children  Discharge Placement                Patient to be transferred to facility by: [facility] Name of family member notified: Daughter [name] Patient and family notified of of transfer: 04/10/21  Discharge Plan and Services In-house Referral: Clinical Social Work Discharge Planning Services: [employer] Consult Post Acute Care Choice: Home Health                               Social Determinants of Health (SDOH) Interventions     Readmission Risk Interventions No flowsheet data found.

## 2021-04-12 LAB — CULTURE, BLOOD (ROUTINE X 2): Special Requests: ADEQUATE

## 2021-04-14 LAB — CULTURE, BLOOD (ROUTINE X 2): Culture: NO GROWTH

## 2021-04-18 ENCOUNTER — Encounter: Payer: Self-pay | Admitting: Adult Health

## 2021-04-24 ENCOUNTER — Telehealth: Payer: Self-pay | Admitting: Adult Health

## 2021-04-24 NOTE — Telephone Encounter (Signed)
Spoke to patients daughter, [name] and expressed my condolences

## 2021-05-15 DEATH — deceased

## 2021-05-25 ENCOUNTER — Telehealth: Payer: Self-pay

## 2021-05-28 NOTE — Telephone Encounter (Signed)
Pt medication was sent into the office. Was going to call pt to pick up medication but did not know pt was deceased. Had to document action in order to close pt chart.

## 2021-05-30 ENCOUNTER — Ambulatory Visit: Payer: HMO

## 2021-06-19 ENCOUNTER — Telehealth: Payer: HMO
